# Patient Record
Sex: Female | Born: 1960 | Race: White | Hispanic: No | Marital: Married | Smoking: Never smoker
Health system: Southern US, Community
[De-identification: ages and names within clinical notes are randomized; demographics above are authoritative.]

## PROBLEM LIST (undated history)

## (undated) DIAGNOSIS — Z9889 Other specified postprocedural states: Secondary | ICD-10-CM

## (undated) DIAGNOSIS — I739 Peripheral vascular disease, unspecified: Secondary | ICD-10-CM

## (undated) DIAGNOSIS — C50919 Malignant neoplasm of unspecified site of unspecified female breast: Secondary | ICD-10-CM

## (undated) DIAGNOSIS — C7951 Secondary malignant neoplasm of bone: Secondary | ICD-10-CM

## (undated) DIAGNOSIS — R569 Unspecified convulsions: Secondary | ICD-10-CM

## (undated) DIAGNOSIS — R112 Nausea with vomiting, unspecified: Secondary | ICD-10-CM

## (undated) DIAGNOSIS — Z923 Personal history of irradiation: Secondary | ICD-10-CM

## (undated) HISTORY — DX: Malignant neoplasm of unspecified site of unspecified female breast: C50.919

## (undated) HISTORY — DX: Unspecified convulsions: R56.9

## (undated) HISTORY — PX: OTHER SURGICAL HISTORY: SHX169

---

## 2003-04-01 HISTORY — PX: BREAST LUMPECTOMY: SHX2

## 2004-01-26 ENCOUNTER — Other Ambulatory Visit: Admission: RE | Admit: 2004-01-26 | Discharge: 2004-01-26 | Payer: Self-pay | Admitting: Obstetrics and Gynecology

## 2004-02-06 ENCOUNTER — Encounter: Admission: RE | Admit: 2004-02-06 | Discharge: 2004-02-06 | Payer: Self-pay | Admitting: Obstetrics and Gynecology

## 2004-02-15 ENCOUNTER — Encounter: Admission: RE | Admit: 2004-02-15 | Discharge: 2004-02-15 | Payer: Self-pay | Admitting: *Deleted

## 2004-03-15 ENCOUNTER — Ambulatory Visit (HOSPITAL_BASED_OUTPATIENT_CLINIC_OR_DEPARTMENT_OTHER): Admission: RE | Admit: 2004-03-15 | Discharge: 2004-03-15 | Payer: Self-pay | Admitting: *Deleted

## 2004-03-15 ENCOUNTER — Ambulatory Visit (HOSPITAL_COMMUNITY): Admission: RE | Admit: 2004-03-15 | Discharge: 2004-03-15 | Payer: Self-pay | Admitting: *Deleted

## 2004-03-19 ENCOUNTER — Ambulatory Visit: Payer: Self-pay | Admitting: Oncology

## 2004-03-28 ENCOUNTER — Encounter (INDEPENDENT_AMBULATORY_CARE_PROVIDER_SITE_OTHER): Payer: Self-pay | Admitting: *Deleted

## 2004-03-28 ENCOUNTER — Ambulatory Visit: Admission: RE | Admit: 2004-03-28 | Discharge: 2004-03-28 | Payer: Self-pay | Admitting: Oncology

## 2004-03-30 ENCOUNTER — Emergency Department (HOSPITAL_COMMUNITY): Admission: EM | Admit: 2004-03-30 | Discharge: 2004-03-30 | Payer: Self-pay | Admitting: Emergency Medicine

## 2004-03-31 ENCOUNTER — Emergency Department (HOSPITAL_COMMUNITY): Admission: EM | Admit: 2004-03-31 | Discharge: 2004-03-31 | Payer: Self-pay | Admitting: Emergency Medicine

## 2004-04-25 ENCOUNTER — Ambulatory Visit (HOSPITAL_COMMUNITY): Admission: RE | Admit: 2004-04-25 | Discharge: 2004-04-25 | Payer: Self-pay | Admitting: General Surgery

## 2004-04-25 ENCOUNTER — Ambulatory Visit (HOSPITAL_BASED_OUTPATIENT_CLINIC_OR_DEPARTMENT_OTHER): Admission: RE | Admit: 2004-04-25 | Discharge: 2004-04-25 | Payer: Self-pay | Admitting: General Surgery

## 2004-04-25 ENCOUNTER — Encounter (INDEPENDENT_AMBULATORY_CARE_PROVIDER_SITE_OTHER): Payer: Self-pay | Admitting: *Deleted

## 2004-05-15 ENCOUNTER — Ambulatory Visit: Payer: Self-pay | Admitting: Oncology

## 2004-06-28 ENCOUNTER — Ambulatory Visit: Payer: Self-pay | Admitting: Oncology

## 2004-07-02 ENCOUNTER — Ambulatory Visit: Admission: RE | Admit: 2004-07-02 | Discharge: 2004-08-14 | Payer: Self-pay | Admitting: Radiation Oncology

## 2004-07-31 ENCOUNTER — Ambulatory Visit: Payer: Self-pay | Admitting: Oncology

## 2004-08-05 ENCOUNTER — Ambulatory Visit: Payer: Self-pay | Admitting: Radiation Oncology

## 2004-08-29 ENCOUNTER — Ambulatory Visit: Payer: Self-pay | Admitting: Radiation Oncology

## 2004-09-28 ENCOUNTER — Ambulatory Visit: Payer: Self-pay | Admitting: Radiation Oncology

## 2004-10-07 ENCOUNTER — Ambulatory Visit: Payer: Self-pay | Admitting: Oncology

## 2004-10-29 ENCOUNTER — Ambulatory Visit: Payer: Self-pay | Admitting: Radiation Oncology

## 2004-11-06 ENCOUNTER — Ambulatory Visit (HOSPITAL_BASED_OUTPATIENT_CLINIC_OR_DEPARTMENT_OTHER): Admission: RE | Admit: 2004-11-06 | Discharge: 2004-11-06 | Payer: Self-pay | Admitting: General Surgery

## 2004-12-05 ENCOUNTER — Ambulatory Visit: Payer: Self-pay | Admitting: Oncology

## 2005-02-06 ENCOUNTER — Encounter: Admission: RE | Admit: 2005-02-06 | Discharge: 2005-02-06 | Payer: Self-pay | Admitting: Internal Medicine

## 2005-03-04 ENCOUNTER — Ambulatory Visit: Payer: Self-pay | Admitting: Oncology

## 2005-06-20 ENCOUNTER — Ambulatory Visit: Payer: Self-pay | Admitting: Oncology

## 2005-07-22 ENCOUNTER — Encounter: Admission: RE | Admit: 2005-07-22 | Discharge: 2005-07-22 | Payer: Self-pay | Admitting: Oncology

## 2005-08-02 ENCOUNTER — Encounter: Admission: RE | Admit: 2005-08-02 | Discharge: 2005-08-02 | Payer: Self-pay | Admitting: Oncology

## 2005-08-03 ENCOUNTER — Encounter: Admission: RE | Admit: 2005-08-03 | Discharge: 2005-08-03 | Payer: Self-pay | Admitting: Oncology

## 2005-08-12 ENCOUNTER — Encounter (INDEPENDENT_AMBULATORY_CARE_PROVIDER_SITE_OTHER): Payer: Self-pay | Admitting: *Deleted

## 2005-08-12 ENCOUNTER — Encounter: Admission: RE | Admit: 2005-08-12 | Discharge: 2005-08-12 | Payer: Self-pay | Admitting: Oncology

## 2005-11-11 ENCOUNTER — Ambulatory Visit: Payer: Self-pay | Admitting: Oncology

## 2005-11-13 LAB — CBC WITH DIFFERENTIAL (CANCER CENTER ONLY)
EOS%: 3.2 % (ref 0.0–7.0)
Eosinophils Absolute: 0.2 10*3/uL (ref 0.0–0.5)
LYMPH%: 25.7 % (ref 14.0–48.0)
MCH: 32.3 pg (ref 26.0–34.0)
MCHC: 33.7 g/dL (ref 32.0–36.0)
MCV: 96 fL (ref 81–101)
MONO%: 7.8 % (ref 0.0–13.0)
NEUT#: 4.4 10*3/uL (ref 1.5–6.5)
Platelets: 322 10*3/uL (ref 145–400)
RBC: 4.02 10*6/uL (ref 3.70–5.32)

## 2005-11-14 LAB — COMPREHENSIVE METABOLIC PANEL
ALT: 31 U/L (ref 0–40)
AST: 26 U/L (ref 0–37)
Albumin: 4.1 g/dL (ref 3.5–5.2)
Alkaline Phosphatase: 86 U/L (ref 39–117)
BUN: 11 mg/dL (ref 6–23)
CO2: 25 mEq/L (ref 19–32)
Calcium: 9.5 mg/dL (ref 8.4–10.5)
Chloride: 108 mEq/L (ref 96–112)
Creatinine, Ser: 0.9 mg/dL (ref 0.40–1.20)
Glucose, Bld: 110 mg/dL — ABNORMAL HIGH (ref 70–99)
Potassium: 4 mEq/L (ref 3.5–5.3)
Sodium: 142 mEq/L (ref 135–145)
Total Bilirubin: 0.6 mg/dL (ref 0.3–1.2)
Total Protein: 6.5 g/dL (ref 6.0–8.3)

## 2005-11-14 LAB — LACTATE DEHYDROGENASE: LDH: 141 U/L (ref 94–250)

## 2005-11-14 LAB — CANCER ANTIGEN 27.29: CA 27.29: 15 U/mL (ref 0–39)

## 2006-02-06 ENCOUNTER — Encounter: Admission: RE | Admit: 2006-02-06 | Discharge: 2006-02-06 | Payer: Self-pay | Admitting: Oncology

## 2006-04-17 ENCOUNTER — Emergency Department: Payer: Self-pay | Admitting: Internal Medicine

## 2006-05-25 ENCOUNTER — Ambulatory Visit: Payer: Self-pay | Admitting: Oncology

## 2006-05-26 LAB — COMPREHENSIVE METABOLIC PANEL
ALT: 28 U/L (ref 0–35)
AST: 21 U/L (ref 0–37)
Albumin: 4.2 g/dL (ref 3.5–5.2)
Alkaline Phosphatase: 93 U/L (ref 39–117)
BUN: 12 mg/dL (ref 6–23)
CO2: 27 mEq/L (ref 19–32)
Calcium: 9.6 mg/dL (ref 8.4–10.5)
Chloride: 100 mEq/L (ref 96–112)
Creatinine, Ser: 0.82 mg/dL (ref 0.40–1.20)
Glucose, Bld: 96 mg/dL (ref 70–99)
Potassium: 3.7 mEq/L (ref 3.5–5.3)
Sodium: 137 mEq/L (ref 135–145)
Total Bilirubin: 0.5 mg/dL (ref 0.3–1.2)
Total Protein: 6.9 g/dL (ref 6.0–8.3)

## 2006-05-26 LAB — CBC WITH DIFFERENTIAL (CANCER CENTER ONLY)
BASO%: 0.5 % (ref 0.0–2.0)
LYMPH%: 29.2 % (ref 14.0–48.0)
MCH: 33.5 pg (ref 26.0–34.0)
MCV: 97 fL (ref 81–101)
MONO#: 0.4 10*3/uL (ref 0.1–0.9)
MONO%: 6.4 % (ref 0.0–13.0)
NEUT#: 4.1 10*3/uL (ref 1.5–6.5)
Platelets: 347 10*3/uL (ref 145–400)
RDW: 12 % (ref 10.5–14.6)
WBC: 6.6 10*3/uL (ref 3.9–10.0)

## 2006-05-26 LAB — CANCER ANTIGEN 27.29: CA 27.29: 26 U/mL (ref 0–39)

## 2006-05-26 LAB — LACTATE DEHYDROGENASE: LDH: 144 U/L (ref 94–250)

## 2006-07-02 ENCOUNTER — Emergency Department: Payer: Self-pay | Admitting: Emergency Medicine

## 2006-07-27 ENCOUNTER — Encounter: Admission: RE | Admit: 2006-07-27 | Discharge: 2006-07-27 | Payer: Self-pay | Admitting: Oncology

## 2006-11-12 ENCOUNTER — Ambulatory Visit: Payer: Self-pay | Admitting: Oncology

## 2006-11-13 LAB — CBC WITH DIFFERENTIAL (CANCER CENTER ONLY)
BASO%: 0.6 % (ref 0.0–2.0)
LYMPH#: 1.5 10*3/uL (ref 0.9–3.3)
MONO#: 0.3 10*3/uL (ref 0.1–0.9)
Platelets: 314 10*3/uL (ref 145–400)
RBC: 4.38 10*6/uL (ref 3.70–5.32)
RDW: 11.9 % (ref 10.5–14.6)
WBC: 4.7 10*3/uL (ref 3.9–10.0)

## 2006-11-13 LAB — COMPREHENSIVE METABOLIC PANEL
ALT: 62 U/L — ABNORMAL HIGH (ref 0–35)
AST: 27 U/L (ref 0–37)
Albumin: 4.6 g/dL (ref 3.5–5.2)
Alkaline Phosphatase: 90 U/L (ref 39–117)
BUN: 10 mg/dL (ref 6–23)
CO2: 22 mEq/L (ref 19–32)
Calcium: 9.4 mg/dL (ref 8.4–10.5)
Chloride: 104 mEq/L (ref 96–112)
Creatinine, Ser: 0.74 mg/dL (ref 0.40–1.20)
Glucose, Bld: 90 mg/dL (ref 70–99)
Potassium: 4.1 mEq/L (ref 3.5–5.3)
Sodium: 139 mEq/L (ref 135–145)
Total Bilirubin: 0.6 mg/dL (ref 0.3–1.2)
Total Protein: 7 g/dL (ref 6.0–8.3)

## 2006-11-13 LAB — CANCER ANTIGEN 27.29: CA 27.29: 19 U/mL (ref 0–39)

## 2007-07-01 ENCOUNTER — Ambulatory Visit: Payer: Self-pay | Admitting: Oncology

## 2007-09-01 ENCOUNTER — Encounter: Admission: RE | Admit: 2007-09-01 | Discharge: 2007-09-01 | Payer: Self-pay | Admitting: Oncology

## 2007-09-10 ENCOUNTER — Ambulatory Visit: Payer: Self-pay | Admitting: Oncology

## 2007-10-28 ENCOUNTER — Ambulatory Visit: Payer: Self-pay | Admitting: Oncology

## 2007-11-02 LAB — CBC WITH DIFFERENTIAL (CANCER CENTER ONLY)
BASO#: 0 10*3/uL (ref 0.0–0.2)
BASO%: 0.5 % (ref 0.0–2.0)
EOS%: 2.6 % (ref 0.0–7.0)
Eosinophils Absolute: 0.2 10*3/uL (ref 0.0–0.5)
HCT: 39.9 % (ref 34.8–46.6)
HGB: 13.8 g/dL (ref 11.6–15.9)
LYMPH#: 1.9 10*3/uL (ref 0.9–3.3)
LYMPH%: 31 % (ref 14.0–48.0)
MCH: 31.8 pg (ref 26.0–34.0)
MCHC: 34.5 g/dL (ref 32.0–36.0)
MCV: 92 fL (ref 81–101)
MONO#: 0.4 10*3/uL (ref 0.1–0.9)
MONO%: 6.4 % (ref 0.0–13.0)
NEUT#: 3.6 10*3/uL (ref 1.5–6.5)
NEUT%: 59.5 % (ref 39.6–80.0)
Platelets: 323 10*3/uL (ref 145–400)
RBC: 4.33 10*6/uL (ref 3.70–5.32)
RDW: 11.8 % (ref 10.5–14.6)
WBC: 6.1 10*3/uL (ref 3.9–10.0)

## 2007-11-02 LAB — COMPREHENSIVE METABOLIC PANEL
ALT: 21 U/L (ref 0–35)
AST: 15 U/L (ref 0–37)
Albumin: 4.7 g/dL (ref 3.5–5.2)
Alkaline Phosphatase: 69 U/L (ref 39–117)
BUN: 15 mg/dL (ref 6–23)
CO2: 25 mEq/L (ref 19–32)
Calcium: 10.2 mg/dL (ref 8.4–10.5)
Chloride: 103 mEq/L (ref 96–112)
Creatinine, Ser: 0.84 mg/dL (ref 0.40–1.20)
Glucose, Bld: 79 mg/dL (ref 70–99)
Potassium: 4 mEq/L (ref 3.5–5.3)
Sodium: 141 mEq/L (ref 135–145)
Total Bilirubin: 0.5 mg/dL (ref 0.3–1.2)
Total Protein: 7.6 g/dL (ref 6.0–8.3)

## 2007-11-02 LAB — CANCER ANTIGEN 27.29: CA 27.29: 25 U/mL (ref 0–39)

## 2008-03-31 DIAGNOSIS — R569 Unspecified convulsions: Secondary | ICD-10-CM

## 2008-03-31 HISTORY — DX: Unspecified convulsions: R56.9

## 2010-02-28 DIAGNOSIS — I739 Peripheral vascular disease, unspecified: Secondary | ICD-10-CM

## 2010-02-28 HISTORY — PX: PORTACATH PLACEMENT: SHX2246

## 2010-02-28 HISTORY — PX: AXILLARY LYMPH NODE DISSECTION: SHX5229

## 2010-02-28 HISTORY — DX: Peripheral vascular disease, unspecified: I73.9

## 2010-04-17 LAB — PROTIME-INR

## 2010-04-21 ENCOUNTER — Encounter: Payer: Self-pay | Admitting: Oncology

## 2010-08-16 NOTE — Op Note (Signed)
NAME:  [name] [name], [name] [name]           ACCOUNT NO.:  [id number]   MEDICAL RECORD NO.:  [id number]          PATIENT TYPE:  AMB   LOCATION:  DSC                          FACILITY:  [facility]   PHYSICIAN:  Anselm Pancoast. Weatherly, M.D.DATE OF BIRTH:  1960-10-29   DATE OF PROCEDURE:  11/06/2004  DATE OF DISCHARGE:                                 OPERATIVE REPORT   PREOPERATIVE DIAGNOSIS:  Port-A-Cath non-use, status post treatment for  carcinoma of the right breast.   OPERATION/PROCEDURE:  Removal of the Port-A-Cath.   ANESTHESIA:  Local with sedation.   SURGEON:  Anselm Pancoast. Zachery Dakins, M.D.   HISTORY:  [name] [name] is a 50 year old female who had a lumpectomy  approximately six months ago.  Had chemotherapy and radiation afterwards.  She has completed all this and now no longer needs a Port-A-Cath.  She is  doing nicely and desires local with sedation instead of doing it with  straight local only.   DESCRIPTION OF PROCEDURE:  She was taken to the operative suite.  An IV had  been started on the left hand.  The Port-A-Cath is in the left subclavian  area.  Prep with Betadine surgical solution and draped in the sterile  manner.  The Port-A-Cath which was low profile, incision site was  infiltrated with Xylocaine with adrenalin and a small incision was made.  Short dissection down to the Port-A-Cath.  The two sutures anchoring it were  divided so that the Port-A-Cath could be elevated.  I then placed a 3-0  chromic around the Silastic tube and removed the Port-A-Cath tying the  little 3-0 chromic.  The pocket then was closed with 3-0 chromic interrupted  sutures, 0 Monocryl subcuticular and then a couple of half-inch Benzoin and  Steri-Strips on the skin.  The patient tolerated the procedure nicely and  was sent to the recovery room in stable postoperative condition.   Hopefully, she will have no further problems with her breast cancer. Will be  seen in followup in approximately two  weeks.       WJW/MEDQ  D:  11/06/2004  T:  11/06/2004  Job:  [id number]

## 2011-03-05 ENCOUNTER — Ambulatory Visit: Payer: 59 | Attending: Hematology & Oncology | Admitting: Physical Therapy

## 2011-03-05 DIAGNOSIS — M25519 Pain in unspecified shoulder: Secondary | ICD-10-CM | POA: Insufficient documentation

## 2011-03-05 DIAGNOSIS — M24519 Contracture, unspecified shoulder: Secondary | ICD-10-CM | POA: Insufficient documentation

## 2011-03-05 DIAGNOSIS — IMO0001 Reserved for inherently not codable concepts without codable children: Secondary | ICD-10-CM | POA: Insufficient documentation

## 2011-03-05 DIAGNOSIS — I890 Lymphedema, not elsewhere classified: Secondary | ICD-10-CM | POA: Insufficient documentation

## 2011-03-07 ENCOUNTER — Ambulatory Visit: Payer: 59 | Admitting: Physical Therapy

## 2011-03-28 ENCOUNTER — Encounter: Payer: 59 | Admitting: Physical Therapy

## 2011-04-02 ENCOUNTER — Ambulatory Visit: Payer: 59 | Attending: Hematology & Oncology | Admitting: Physical Therapy

## 2011-04-02 DIAGNOSIS — I890 Lymphedema, not elsewhere classified: Secondary | ICD-10-CM | POA: Insufficient documentation

## 2011-04-02 DIAGNOSIS — IMO0001 Reserved for inherently not codable concepts without codable children: Secondary | ICD-10-CM | POA: Insufficient documentation

## 2011-04-02 DIAGNOSIS — M25519 Pain in unspecified shoulder: Secondary | ICD-10-CM | POA: Insufficient documentation

## 2011-04-02 DIAGNOSIS — M24519 Contracture, unspecified shoulder: Secondary | ICD-10-CM | POA: Insufficient documentation

## 2011-04-04 ENCOUNTER — Encounter: Payer: 59 | Admitting: Physical Therapy

## 2011-04-07 ENCOUNTER — Encounter: Payer: 59 | Admitting: Physical Therapy

## 2011-04-09 ENCOUNTER — Encounter: Payer: 59 | Admitting: Physical Therapy

## 2011-04-14 ENCOUNTER — Telehealth: Payer: Self-pay | Admitting: Oncology

## 2011-04-14 ENCOUNTER — Ambulatory Visit (HOSPITAL_BASED_OUTPATIENT_CLINIC_OR_DEPARTMENT_OTHER): Payer: 59 | Admitting: Oncology

## 2011-04-14 ENCOUNTER — Ambulatory Visit: Payer: 59

## 2011-04-14 ENCOUNTER — Encounter: Payer: 59 | Admitting: Physical Therapy

## 2011-04-14 ENCOUNTER — Other Ambulatory Visit: Payer: 59 | Admitting: Lab

## 2011-04-14 VITALS — BP 129/85 | HR 109 | Temp 98.4°F | Ht 63.0 in | Wt 174.9 lb

## 2011-04-14 DIAGNOSIS — R232 Flushing: Secondary | ICD-10-CM

## 2011-04-14 DIAGNOSIS — F411 Generalized anxiety disorder: Secondary | ICD-10-CM

## 2011-04-14 DIAGNOSIS — C50919 Malignant neoplasm of unspecified site of unspecified female breast: Secondary | ICD-10-CM

## 2011-04-14 DIAGNOSIS — F419 Anxiety disorder, unspecified: Secondary | ICD-10-CM

## 2011-04-14 MED ORDER — BUPROPION HCL ER (XL) 150 MG PO TB24: 150.0000 mg | ORAL_TABLET | Freq: Every day | ORAL | Status: DC

## 2011-04-14 NOTE — Progress Notes (Signed)
[name] [name] [id number] 16-May-1960 51 y.o. 04/14/2011 5:41 PM  CC  No primary provider on file. No primary provider on file.  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 She subsequently had a local regional recurrence to the right axilla for which she was treated at [facility]. She is now reestablishing her care to [facility]  REFERRING PHYSICIAN: Dr. Charlesetta Garibaldi  HISTORY OF PRESENT ILLNESS:  [name] [name] is a 51 y.o. female.  With oncologic history dating back to 2005 when she was diagnosed with right-sided breast carcinoma in 02/06/2004. At that time she underwent a right lumpectomy with sentinel lymph node biopsy that revealed a 1.5 cm invasive ductal carcinoma. 01 positive lymph nodes in 03/15/2004. She post lumpectomy received 4 cycles of AC chemotherapy from 05/08/2004 through April 2006. She then went on to receive radiation [facility] the [facility] June 2006 09/29/2004. Patient was offered adjuvant endocrine therapy but she declined. She was thereafter observed at [facility] by me. I until her husband relocated to [place]. At which time she transferred her care to [facility]. She continued to do well until July 2008 11 when she presented with a right axillary mass. An ultrasound revealed a 2.5 cm lesion and a subsequent ultrasound guided core biopsy revealed a high-grade invasive ductal carcinoma that was ER positive PR borderline positive and HER-2/neu negative. Patient received neoadjuvant chemotherapy consisting of Taxol and Cytoxan for a total of 3 cycles. Her chemotherapy was stopped prematurely secondary to intolerance of Taxol. December 2011 patient was taken to the OR and she underwent an axillary lymph node dissection that revealed 3 of 8 lymph nodes positive for metastatic disease. Postoperatively she read  completed a course of postop radiation therapy to the axilla and supraclavicular regional nodes about in March completing it in March 2012. In February 2012 patient complained of new onset dizziness associated with visual field cuts. She also had an isolated seizure in April 2011. MRI of the brain was ordered that was negative first rubra metastases. Patient was offered adjuvant endocrine therapy by Dr. Kandice Hams in March 2012. The patient did spruce start letrozole 2.5 mg but she could not tolerate it she subsequently received exemestane but she discontinued that as well do to toxicity in July 2012. She was then transitioned to tamoxifen 20 mg September 2012. She was last seen by her oncologist at hematology oncology clinic [facility] on 12/23/2010. She has now relocated to [facility] and she is reestablishing her care here at the [facility].   Past Medical History:  #1 breast cancer as above  #2 isolated seizure April 2010 negative for workup with no recurrent episodes.  #3 elevated liver enzymes.  #4 DVT of the right IJ  #5 status post radiotherapy per history of present illness.  #6 no history of autoimmune illnesses collagen vascular disorders cardiac pacemakers or implanted defibrillator's.  Past Surgical History: #1 status post lumpectomy with sentinel node biopsy 2005.  #2 status post axillary lymph node dissection of the right axilla in December 2011.   No past surgical history on file.  Family History:there is no family history of breast cancers or other malignancies.  Social History:the patient does not smoke does not drink she is currently trying to build her home.  Allergies: Allergies  Allergen Reactions  . Tegaderm Ag Mesh (Silver)  Current Medications: Current Outpatient Prescriptions  Medication Sig Dispense Refill  . Calcium-Vitamin D (CALTRATE 600 PLUS-VIT D PO) Take 600 mg by mouth 2 (two) times daily.      .  folic acid (FOLVITE) 1 MG tablet Take 1 mg by mouth daily.      . Melatonin 1 MG TABS Take 1 mg by mouth at bedtime as needed.      . tamoxifen (NOLVADEX) 20 MG tablet Take 20 mg by mouth daily.      Marland Kitchen buPROPion (WELLBUTRIN XL) 150 MG 24 hr tablet Take 1 tablet (150 mg total) by mouth daily.  30 tablet  6    OB/GYN History:patient is postmenopausal she has not been on hormone replacement therapy. She has had one pregnancy  Fertility Discussion:not applicable Prior History of Cancer:as in the history of present illness   ECOG PERFORMANCE STATUS: 1 - Symptomatic but completely ambulatory  Genetic Counseling/testing:patient did undergo genetic counseling and testing and she was found to be BRCA1 and 2 negative.  REVIEW OF SYSTEMS:  Constitutional: positive for fatigue and night sweats Eyes: negative Ears, nose, mouth, throat, and face: negative Respiratory: negative Cardiovascular: negative Gastrointestinal: positive for constipation Genitourinary:negative Integument/breast: positive for breast tenderness and patient does have a well-healed surgical scar in the right breast she also has a healed surgical scar in the right axilla. She is notice to have some swelling in the axillary region. Hematologic/lymphatic: negative Musculoskeletal:negative Neurological: negative Endocrine: positive for Patient is quite concurrent concerned about weight gain. She is interested in possibly getting a pill to help her lose weight. She does try to exercise and be healthy. But in spite of that she continues to gain weight  PHYSICAL EXAMINATION: Blood pressure 129/85, pulse 109, temperature 98.4 F (36.9 C), temperature source Oral, height 5\' 3"  (1.6 m), weight 174 lb 14.4 oz (79.334 kg).  ZOX:WRUEA, healthy, no distress, well nourished, well developed and anxious SKIN: skin color, texture, turgor are normal HEAD: Normocephalic, No masses, lesions, tenderness or abnormalities EYES: normal, PERRLA,  EOMI, Conjunctiva are pink and non-injected, sclera clear EARS: External ears normal OROPHARYNX:no exudate, no erythema, lips, buccal mucosa, and tongue normal and dentition normal  NECK: supple, no adenopathy, no bruits, no JVD, thyroid normal size, non-tender, without nodularity LYMPH:  no palpable lymphadenopathy, no hepatosplenomegaly BREAST:left breast normal without mass, skin or nipple changes or axillary nodes, surgical scars noted the right breast surgical scar is noted there is also an axillary dissection scar noted there is some swelling in the axilla. LUNGS: clear to auscultation , clear to auscultation and percussion HEART: regular rate & rhythm, no murmurs and no gallops ABDOMEN:abdomen soft, non-tender, normal bowel sounds and no masses or organomegaly BACK: Back symmetric, no curvature., No CVA tenderness, Range of motion is normal EXTREMITIES:no joint deformities, effusion, or inflammation, no edema, no clubbing, no cyanosis  NEURO: alert & oriented x 3 with fluent speech, no focal motor/sensory deficits, gait normal, reflexes normal and symmetric    STUDIES/RESULTS: No results found.   LABS:    Chemistry      Component Value Date/Time   NA 141 11/02/2007 1116   K 4.0 11/02/2007 1116   CL 103 11/02/2007 1116   CO2 25 11/02/2007 1116   BUN 15 11/02/2007 1116   CREATININE 0.84 11/02/2007 1116      Component Value Date/Time   CALCIUM 10.2 11/02/2007 1116   ALKPHOS 69 11/02/2007 1116   AST 15 11/02/2007 1116   ALT 21 11/02/2007 1116  BILITOT 0.5 11/02/2007 1116      Lab Results  Component Value Date   WBC 6.1 11/02/2007   HGB 13.8 11/02/2007   HCT 39.9 11/02/2007   MCV 92 11/02/2007   PLT 323 11/02/2007    ASSESSMENT    51 year old female with previous history of 1.7 cm ER positive breast cancer originally diagnosed in 2005. At that time patient underwent a lumpectomy with sentinel node biopsy followed by adjuvant chemotherapy consisting of 4 cycles of a.c. Thereafter she was offered  endocrine therapy but she declined. She however did undergo radiation therapy. In 2011 patient had a local regional axillary recurrence that was diagnosed at [facility]. The core needle biopsy revealed a high-grade invasive ductal carcinoma that was ER positive HER-2/neu negative. Patient underwent neoadjuvant chemotherapy consisting of 3 cycles of Taxol and Cytoxan but discontinued about due to toxicity and intolerance to Taxol. She then went on to have right axillary lymph node dissection the final pathology revealed 3 of 8 lymph nodes positive for invasive cancer. She received local regional radiation therapy to the excellent as well as the supraclavicular region. Thereafter she was recommended antiestrogen therapy with aromatase inhibitors. She was started on letrozole but could not tolerate it therefore after she was transitioned to Aromasin she could not tolerate that well either. And in September 2012 she was transitioned to tamoxifen which she has been using. She continues to complain of significant fatigue she is constipated from the tamoxifen and she is tired. She also complains of having peripheral neuropathies. She also has noticed anxiety disturbances. She also has noticed some lymphedema in the right upper axillary region. She is working with Lupita Leash in the outpatient rehabilitation setting for possible physical therapy and lymphedema therapy.    PLAN:    Patient will continue the tamoxifen 20 mg on a daily basis. She is encouraged to continue seeing the outpatient physical therapy rehabilitation for ongoing therapy with lymphedema. We discussed exercise diet. For peripheral neuropathy we did discuss B12 and possibly of doing folic acid and we also discussed possibility of gabapentin but at this time patient is not interested in doing that. For anxiety hot flashes we did discuss Wellbutrin she was given a prescription for 150 mg daily. She will continue this. We also discussed bone health and  she was recommended that she begin vitamin D3 and we will continue to follow her bone densities. I spent considerable amount of time counseling the patient regarding her disease process. At this time I do not recommend any routine staging studies we would do of radiographic studies if there is symptoms. We will however monitor her liver function studies and she has had liver abnormalities in the past.    Patient will be seen back in one month's time in the survivor clinic and I will continue to see her every 63-6 months time or    Thank you so much for allowing me to participate in the care of [facility] I will continue to follow up the patient with you and assist in her care.  All questions were answered. The patient knows to call the clinic with any problems, questions or concerns. We can certainly see the patient much sooner if necessary.  I spent 55 minutes counseling the patient face to face. The total time spent in the appointment was 60 minutes.  Drue Second, MD Medical/Oncology [facility] [phone] (beeper) [phone] (Office)  04/14/2011, 5:41 PM 04/14/2011, 5:41 PM

## 2011-04-14 NOTE — Telephone Encounter (Signed)
S/w the pt and she is aware of her feb 2013 appt calendar

## 2011-04-16 ENCOUNTER — Encounter: Payer: 59 | Admitting: Physical Therapy

## 2011-04-21 ENCOUNTER — Encounter: Payer: 59 | Admitting: Physical Therapy

## 2011-04-22 ENCOUNTER — Telehealth: Payer: Self-pay | Admitting: *Deleted

## 2011-04-22 ENCOUNTER — Encounter: Payer: Self-pay | Admitting: *Deleted

## 2011-04-22 NOTE — Telephone Encounter (Signed)
Pt called had reaction to Wellbutrin on Saturday & stopped medication.. Will review with MD if other medication is needed instead

## 2011-04-23 ENCOUNTER — Ambulatory Visit: Payer: 59 | Admitting: Physical Therapy

## 2011-04-23 ENCOUNTER — Encounter: Payer: 59 | Admitting: Physical Therapy

## 2011-04-25 ENCOUNTER — Ambulatory Visit: Payer: 59 | Admitting: Physical Therapy

## 2011-04-25 NOTE — Telephone Encounter (Signed)
Per MDAsked pt if she Pt asked

## 2011-04-28 ENCOUNTER — Encounter: Payer: 59 | Admitting: Physical Therapy

## 2011-04-28 ENCOUNTER — Ambulatory Visit: Payer: 59 | Admitting: Physical Therapy

## 2011-04-28 MED ORDER — GABAPENTIN 100 MG PO CAPS: 100.0000 mg | ORAL_CAPSULE | Freq: Every day | ORAL | Status: DC

## 2011-04-28 NOTE — Telephone Encounter (Signed)
Per MD pt to start Neurontin 100mg  Daily x 1week , if tolerates pt to increase to Neurontin 100mg  BID. Called pt to advise. Confirmed pt's Pharmacy Rx to be called into [facility]. Pt advised she will start on Saturday As her husband will be back in town and she would like to wait until that time to start medication in case there are any side effects.

## 2011-04-28 NOTE — Progress Notes (Signed)
Addended by: Cooper Render on: 04/28/2011 05:10 PM   Modules accepted: Orders

## 2011-04-30 ENCOUNTER — Ambulatory Visit: Payer: 59 | Admitting: Physical Therapy

## 2011-04-30 ENCOUNTER — Encounter: Payer: 59 | Admitting: Physical Therapy

## 2011-05-02 ENCOUNTER — Ambulatory Visit: Payer: 59 | Attending: Hematology & Oncology | Admitting: Physical Therapy

## 2011-05-02 DIAGNOSIS — IMO0001 Reserved for inherently not codable concepts without codable children: Secondary | ICD-10-CM | POA: Insufficient documentation

## 2011-05-02 DIAGNOSIS — I890 Lymphedema, not elsewhere classified: Secondary | ICD-10-CM | POA: Insufficient documentation

## 2011-05-02 DIAGNOSIS — M24519 Contracture, unspecified shoulder: Secondary | ICD-10-CM | POA: Insufficient documentation

## 2011-05-02 DIAGNOSIS — M25519 Pain in unspecified shoulder: Secondary | ICD-10-CM | POA: Insufficient documentation

## 2011-05-05 ENCOUNTER — Ambulatory Visit: Payer: 59 | Admitting: Physical Therapy

## 2011-05-07 ENCOUNTER — Ambulatory Visit: Payer: 59 | Admitting: Physical Therapy

## 2011-05-09 ENCOUNTER — Ambulatory Visit: Payer: 59 | Admitting: Physical Therapy

## 2011-05-12 ENCOUNTER — Ambulatory Visit: Payer: 59 | Admitting: Physical Therapy

## 2011-05-14 ENCOUNTER — Ambulatory Visit: Payer: 59 | Admitting: Physical Therapy

## 2011-05-15 ENCOUNTER — Telehealth: Payer: Self-pay | Admitting: *Deleted

## 2011-05-15 NOTE — Telephone Encounter (Signed)
Please tell patient to discontinue the neurontin

## 2011-05-15 NOTE — Telephone Encounter (Signed)
Returned pt's call, discussed concerns. Pt advised she takes Neurotin 100mg  BID  "It makes me sick in am, but not at night and it is not helping and I'm not interested in going up on the dose" Reviewed with MD, Per Dr. Welton Flakes pt is to take Neurotin 200mg  at night.  Notified pt per MD.to take Neurotin 200mg  at night.  Pt states " No, I want to just stay at 100mg  at night and I will think about increasing the dose later on. It's not painful just like my feet are asleep, tingling" Discussed with pt by taking the 200mg  at night per MD this may help with the symptoms she is describing. Pt again states , "no i will stay at 100 at night. I want to try this cream a friend of mine uses that she says helps." Pt unable to recall name of medication, pt verbalized she will speak to [place] on 2/22 at next appt. Pt denied needing further assistance.  Notified MD pt has declined recommendations to take Neurotin 200mg  at night and will continue 100mg  at night.

## 2011-05-16 ENCOUNTER — Ambulatory Visit: Payer: 59 | Admitting: Physical Therapy

## 2011-05-19 ENCOUNTER — Ambulatory Visit: Payer: 59 | Admitting: Physical Therapy

## 2011-05-21 ENCOUNTER — Ambulatory Visit: Payer: 59 | Admitting: Physical Therapy

## 2011-05-22 ENCOUNTER — Other Ambulatory Visit: Payer: 59 | Admitting: Lab

## 2011-05-22 ENCOUNTER — Telehealth: Payer: Self-pay | Admitting: *Deleted

## 2011-05-22 ENCOUNTER — Ambulatory Visit (HOSPITAL_BASED_OUTPATIENT_CLINIC_OR_DEPARTMENT_OTHER): Payer: 59 | Admitting: Family

## 2011-05-22 VITALS — BP 127/87 | HR 101 | Temp 97.8°F | Ht 63.0 in | Wt 178.0 lb

## 2011-05-22 DIAGNOSIS — C50919 Malignant neoplasm of unspecified site of unspecified female breast: Secondary | ICD-10-CM

## 2011-05-22 LAB — CBC WITH DIFFERENTIAL/PLATELET
BASO%: 0.3 % (ref 0.0–2.0)
EOS%: 1.2 % (ref 0.0–7.0)
HCT: 40.7 % (ref 34.8–46.6)
LYMPH%: 23.9 % (ref 14.0–49.7)
MCH: 32.8 pg (ref 25.1–34.0)
MCHC: 34.6 g/dL (ref 31.5–36.0)
NEUT%: 71.2 % (ref 38.4–76.8)
RBC: 4.28 10*6/uL (ref 3.70–5.45)
WBC: 6.1 10*3/uL (ref 3.9–10.3)
lymph#: 1.4 10*3/uL (ref 0.9–3.3)

## 2011-05-22 LAB — COMPREHENSIVE METABOLIC PANEL
ALT: 29 U/L (ref 0–35)
AST: 25 U/L (ref 0–37)
Chloride: 101 mEq/L (ref 96–112)
Creatinine, Ser: 0.93 mg/dL (ref 0.50–1.10)
Sodium: 139 mEq/L (ref 135–145)
Total Bilirubin: 0.3 mg/dL (ref 0.3–1.2)
Total Protein: 6.8 g/dL (ref 6.0–8.3)

## 2011-05-22 NOTE — Telephone Encounter (Signed)
gave patient appointment for 07-2011 per orders no labs needed

## 2011-05-23 ENCOUNTER — Encounter: Payer: Self-pay | Admitting: Family

## 2011-05-23 ENCOUNTER — Ambulatory Visit: Payer: 59 | Admitting: Physical Therapy

## 2011-05-23 DIAGNOSIS — C50919 Malignant neoplasm of unspecified site of unspecified female breast: Secondary | ICD-10-CM | POA: Insufficient documentation

## 2011-05-23 NOTE — Progress Notes (Signed)
[facility] SURVIVOR CLINIC EVALUATION  Name: [name] [name]                  DATE: 05/23/2011 MRN: [id number]                      DOB: [birth year]           REASON FOR VISIT: Establish care in the Breast Cancer Survivor Clinic.   DIAGNOSIS:  Encounter Diagnosis  Name Primary?  . Breast cancer     CANCER STAGE:  2005,  T1 N1 M0 Stage IIA right breast cancer. Axillary recurrence July 2011.   HISTORY OF PRESENT ILLNESS: 02/06/2004 was diagnosed with right breast carcinoma. 03/15/2004 had right lumpectomy with sentinel lymph node biopsy that revealed a 1.5 cm invasive ductal carcinoma with 1 positive lymph nodes. Received 4 cycles of AC chemotherapy from 05/08/2004 through April 2006. Received radiation therapy at [facility] regional June 2006 09/29/2004. Was offered adjuvant endocrine therapy, declined. Was observed at [facility] by Dr. Welton Flakes until her husband relocated to [place], [place] and she transferred her care to [facility]. July 2011, presented with a right axillary mass, ultrasound revealed a 2.5 cm lesion. Core biopsy revealed a high-grade invasive ductal carcinoma, ER/PR positive, HER-2/neu negative. Received neoadjuvant chemotherapy with Taxol and Cytoxan, 3 cycles. Chemotherapy was stopped prematurely secondary to intolerance of Taxol. December 2011 had axillary lymph node dissection that revealed 3 of 8 lymph nodes positive for metastatic disease. Postoperatively, had radiation therapy to the axilla and supraclavicular regional nodes, completing March 2012. February 2012, complained of new onset dizziness associated with visual field cuts, had an isolated seizure in April 2011. MRI of the brain negative for metastases. Was offered adjuvant endocrine therapy by Dr. Kandice Hams, March 2012. Started letrozole 2.5 mg, unable to tolerate and subsequently received exemestane. Discontinued exemestane due to toxicity July 2012. Started  tamoxifen 20 mg September 2012. Last seen by oncology clinic [facility] 12/23/2010. Has now transferred care to [facility].  Dr. Welton Flakes had prescribed gabapentin on recent visit for hot flashes. She wishes to discontinue due to side effects.  Has ongoing problems with lymphedema, right breast and right arm. Is receiving occupational therapy with good results.    PAST MEDICAL HISTORY:  Past Medical History  Diagnosis Date  . Breast cancer   . Seizures 2010    Isolated incident.     ALLERGIES:  Allergies as of 05/22/2011 - Review Complete 05/22/2011  Allergen Reaction Noted  . Tegaderm ag mesh (silver)  04/14/2011     MEDICATIONS:  Current Outpatient Prescriptions  Medication Sig Dispense Refill  . Calcium-Magnesium-Vitamin D (CITRACAL CALCIUM+D PO) Take by mouth. 400mg  vitamin C, 500mg  vitamin D3 2 PO DAILY      . folic acid (FOLVITE) 1 MG tablet Take 1 mg by mouth daily.      Marland Kitchen gabapentin (NEURONTIN) 100 MG capsule Take 1 capsule (100 mg total) by mouth daily. Pt to start 05/03/11 neurontin 100mg  daily x 1 week then if pt tolerates to increase  Neurontin 100 mg BID.  60 capsule  1  . Melatonin 1 MG TABS Take 1 mg by mouth at bedtime as needed.      . tamoxifen (NOLVADEX) 20 MG tablet Take 20 mg by mouth daily.         SOCIAL HISTORY:  History   Social History: Married   Occupational History: Housewife  Social History Main Topics  . Smoking status: Never Smoker   . Smokeless tobacco: Never Used  . Alcohol Use: No  . Drug Use: No  . Sexually Active: Yes   FAMILY HISTORY: No family history of breast cancer.  SELF-ASSESSMENT CONCERNS: Physical: More fatigued than usual, tingling/numbness in the hands or feet, memory problems, hot flashes, 4- 5 a day. Spiritual/Religious: None. Practical: None. Family/Relationship: None Emotional: Mood swings. Lifestyle or Information Needs: None.   REVIEW OF SYSTEMS: General: Negative for fever,  chills, night sweats,  loss of appetite or weight loss.Hot flashes 4-5 daily.  HEENT: Negative for headaches, sore  throat, difficulty swallowing, blurred vision or problem with hearing or  sinus congestion. Respiratory: Negative for shortness of breath, cough  or dyspnea on exertion. Cardiovascular: Negative for chest pain,  palpitations or pedal edema. GI: Negative for nausea, vomiting,  diarrhea, constipation, change in bowel habits or blood in the stool.  No jaundice. GU: Negative for painful or frequent urination, change in  color of urine, or decreased urinary stream. Integumentary: Negative  for skin rashes or other suspicious skin lesions. Hematologic: Negative  for easy bruisability or bleeding. Musculoskeletal: Negative for  complaints of pain, arthralgias, arthritis or myalgias.  Lymph: Lymphedema, right arm and right axillary tail of the breast. Neurological/psychiatric: Negative for numbness, focal weakness,  balance problems or coordination difficulties. No depression or anxiety. Breast: No self-detected breast complaints.    PHYSICAL EXAM: BP 127/87  Pulse 101  Temp(Src) 97.8 F (36.6 C) (Oral)  Ht 5\' 3"  (1.6 m)  Wt 178 lb (80.74 kg)  BMI 31.53 kg/m2 GENERAL: Well developed, well nourished, in no acute distress.  EENT: No ocular or oral lesions. No stomatitis.  RESPIRATORY: Lungs are clear to auscultation bilaterally with normal respiratory movement and no accessory muscle use. CARDIAC: No murmur, rub or tachycardia. No upper or lower extremity edema.  GI: Abdomen is soft, no palpable hepatosplenomegaly. No fluid wave. No tenderness. Musculoskeletal: No kyphosis, no tenderness over the spine, ribs or hips. Lymph: No cervical, infraclavicular, or inguinal adenopathy. Right upper extremity, mild lymphedema. Breast: In the supine position, with the right arm over the head, the right nipple is everted. No periareolar edema or nipple discharge. No mass in any quadrant or  subareolar region. No redness of the skin. No right axillary adenopathy.10 o'clock position, a remote lumpectomy incision with mirror right axillary incision, 6 cm apart.  With the left arm over the head, the left nipple is everted. No periareolar edema or nipple discharge. No mass in any quadrant or subareolar region. No redness of the skin. No left axillary adenopathy. Neuro: No focal neurological deficits. Psych: Alert and oriented X 3, appropriate mood and affect.     LABORATORY STUDIES:  Results for orders placed in visit on 05/22/11  CBC WITH DIFFERENTIAL      Component Value Range   WBC 6.1  3.9 - 10.3 (10e3/uL)   NEUT# 4.3  1.5 - 6.5 (10e3/uL)   HGB 14.1  11.6 - 15.9 (g/dL)   HCT 96.0  45.4 - 09.8 (%)   Platelets 275  145 - 400 (10e3/uL)   MCV 95.0  79.5 - 101.0 (fL)   MCH 32.8  25.1 - 34.0 (pg)   MCHC 34.6  31.5 - 36.0 (g/dL)   RBC 1.19  1.47 - 8.29 (10e6/uL)   RDW 12.6  11.2 - 14.5 (%)   lymph# 1.4  0.9 - 3.3 (10e3/uL)   MONO# 0.2  0.1 - 0.9 (10e3/uL)  Eosinophils Absolute 0.1  0.0 - 0.5 (10e3/uL)   Basophils Absolute 0.0  0.0 - 0.1 (10e3/uL)   NEUT% 71.2  38.4 - 76.8 (%)   LYMPH% 23.9  14.0 - 49.7 (%)   MONO% 3.4  0.0 - 14.0 (%)   EOS% 1.2  0.0 - 7.0 (%)   BASO% 0.3  0.0 - 2.0 (%)  COMPREHENSIVE METABOLIC PANEL      Component Value Range   Sodium 139  135 - 145 (mEq/L)   Potassium 4.0  3.5 - 5.3 (mEq/L)   Chloride 101  96 - 112 (mEq/L)   CO2 24  19 - 32 (mEq/L)   Glucose, Bld 106 (*) 70 - 99 (mg/dL)   BUN 15  6 - 23 (mg/dL)   Creatinine, Ser 1.61  0.50 - 1.10 (mg/dL)   Total Bilirubin 0.3  0.3 - 1.2 (mg/dL)   Alkaline Phosphatase 50  39 - 117 (U/L)   AST 25  0 - 37 (U/L)   ALT 29  0 - 35 (U/L)   Total Protein 6.8  6.0 - 8.3 (g/dL)   Albumin 4.3  3.5 - 5.2 (g/dL)   Calcium 9.9  8.4 - 09.6 (mg/dL)    LAST BREAST IMAGING: Apr 28 2011. No evidence of malignancy.    TEACHING: I addressed the need to screen for other cancers (skin, colon). We reviewed the need  for self breast exam and instruction was given. I reiterated symptoms to report and when to call the clinic. Risk reduction strategies per NCCN guidelines were reviewed.   IMPRESSION: 1. History Stage IIA right breast cancer, diagnosed 2005 with right axillary recurrence 2011. 2. Intolerant of the aromatase inhibitors, now on tamoxifen with hot flashes the chief complaint. 3. On gabapentin for hot flashes, wishes to discontinue due to side effects. 4. Lymphedema, right arm and right breast.   PLAN: 1. Discontinue gabapentin. She will taper dose. We may consider other options for hot flash relief at next visit.  2. Return to clinic in 3 months for appointment with Dr. Welton Flakes. 3. Continue lymphedema treatment.   DISCUSSION: A total of 60 was spent with the patient. More than 50 minutes were spent on counseling regarding cancer survivor issues and coordination of follow-up care. NCCN guidelines for follow-up care were reviewed and a surveillance plan was outlined. The importance of compliance was stressed.

## 2011-05-28 ENCOUNTER — Encounter: Payer: Self-pay | Admitting: *Deleted

## 2011-05-28 ENCOUNTER — Ambulatory Visit: Payer: 59 | Admitting: Physical Therapy

## 2011-05-28 NOTE — Progress Notes (Unsigned)
Per MD, notified pt that labs "look good" 

## 2011-05-30 ENCOUNTER — Encounter: Payer: 59 | Admitting: Physical Therapy

## 2011-06-04 ENCOUNTER — Ambulatory Visit: Payer: 59 | Attending: Hematology & Oncology | Admitting: Physical Therapy

## 2011-06-04 DIAGNOSIS — M25519 Pain in unspecified shoulder: Secondary | ICD-10-CM | POA: Insufficient documentation

## 2011-06-04 DIAGNOSIS — M24519 Contracture, unspecified shoulder: Secondary | ICD-10-CM | POA: Insufficient documentation

## 2011-06-04 DIAGNOSIS — I890 Lymphedema, not elsewhere classified: Secondary | ICD-10-CM | POA: Insufficient documentation

## 2011-06-04 DIAGNOSIS — IMO0001 Reserved for inherently not codable concepts without codable children: Secondary | ICD-10-CM | POA: Insufficient documentation

## 2011-06-06 ENCOUNTER — Encounter: Payer: 59 | Admitting: Physical Therapy

## 2011-06-11 ENCOUNTER — Ambulatory Visit: Payer: 59 | Admitting: Physical Therapy

## 2011-06-13 ENCOUNTER — Encounter: Payer: 59 | Admitting: Physical Therapy

## 2011-06-16 ENCOUNTER — Other Ambulatory Visit: Payer: Self-pay | Admitting: *Deleted

## 2011-06-16 MED ORDER — BENZONATATE 100 MG PO CAPS: 100.0000 mg | ORAL_CAPSULE | Freq: Three times a day (TID) | ORAL | Status: AC | PRN

## 2011-06-18 ENCOUNTER — Encounter: Payer: 59 | Admitting: Physical Therapy

## 2011-06-20 ENCOUNTER — Encounter: Payer: 59 | Admitting: Physical Therapy

## 2011-06-25 ENCOUNTER — Encounter: Payer: 59 | Admitting: Physical Therapy

## 2011-06-28 ENCOUNTER — Other Ambulatory Visit: Payer: Self-pay | Admitting: Oncology

## 2011-07-10 ENCOUNTER — Ambulatory Visit: Payer: 59 | Attending: Hematology & Oncology | Admitting: Physical Therapy

## 2011-07-10 DIAGNOSIS — I890 Lymphedema, not elsewhere classified: Secondary | ICD-10-CM | POA: Insufficient documentation

## 2011-07-10 DIAGNOSIS — IMO0001 Reserved for inherently not codable concepts without codable children: Secondary | ICD-10-CM | POA: Insufficient documentation

## 2011-07-10 DIAGNOSIS — M25519 Pain in unspecified shoulder: Secondary | ICD-10-CM | POA: Insufficient documentation

## 2011-07-10 DIAGNOSIS — M24519 Contracture, unspecified shoulder: Secondary | ICD-10-CM | POA: Insufficient documentation

## 2011-07-23 ENCOUNTER — Ambulatory Visit: Payer: 59 | Admitting: Physical Therapy

## 2011-07-28 ENCOUNTER — Other Ambulatory Visit: Payer: Self-pay | Admitting: Oncology

## 2011-07-30 ENCOUNTER — Encounter: Payer: 59 | Admitting: Physical Therapy

## 2011-08-06 ENCOUNTER — Encounter: Payer: 59 | Admitting: Physical Therapy

## 2011-08-07 ENCOUNTER — Ambulatory Visit: Payer: 59 | Admitting: Family Medicine

## 2011-08-13 ENCOUNTER — Encounter: Payer: 59 | Admitting: Physical Therapy

## 2011-08-18 ENCOUNTER — Telehealth: Payer: Self-pay | Admitting: *Deleted

## 2011-08-18 NOTE — Telephone Encounter (Signed)
patient called in requesting to reshcedule her appointment to 09-24-2011 at 10:30am patient confirmed over the phone on 08-18-2011

## 2011-08-20 ENCOUNTER — Ambulatory Visit: Payer: 59 | Admitting: Oncology

## 2011-08-26 ENCOUNTER — Other Ambulatory Visit: Payer: Self-pay | Admitting: Oncology

## 2011-09-17 ENCOUNTER — Telehealth: Payer: Self-pay | Admitting: *Deleted

## 2011-09-17 NOTE — Telephone Encounter (Signed)
Patient confirmed over the phone the new date and time of the 11-25-2011 starting at 9:30am

## 2011-09-24 ENCOUNTER — Ambulatory Visit: Payer: 59 | Admitting: Oncology

## 2011-11-25 ENCOUNTER — Ambulatory Visit (HOSPITAL_BASED_OUTPATIENT_CLINIC_OR_DEPARTMENT_OTHER): Payer: 59 | Admitting: Oncology

## 2011-11-25 ENCOUNTER — Encounter: Payer: Self-pay | Admitting: Oncology

## 2011-11-25 ENCOUNTER — Other Ambulatory Visit (HOSPITAL_BASED_OUTPATIENT_CLINIC_OR_DEPARTMENT_OTHER): Payer: 59 | Admitting: Lab

## 2011-11-25 ENCOUNTER — Other Ambulatory Visit: Payer: Self-pay | Admitting: Medical Oncology

## 2011-11-25 VITALS — BP 137/88 | HR 99 | Temp 97.9°F | Resp 20 | Ht 63.0 in | Wt 185.9 lb

## 2011-11-25 DIAGNOSIS — C50919 Malignant neoplasm of unspecified site of unspecified female breast: Secondary | ICD-10-CM

## 2011-11-25 DIAGNOSIS — R5381 Other malaise: Secondary | ICD-10-CM

## 2011-11-25 DIAGNOSIS — K5900 Constipation, unspecified: Secondary | ICD-10-CM

## 2011-11-25 DIAGNOSIS — Z170 Estrogen receptor positive status [ER+]: Secondary | ICD-10-CM

## 2011-11-25 DIAGNOSIS — R5383 Other fatigue: Secondary | ICD-10-CM

## 2011-11-25 LAB — COMPREHENSIVE METABOLIC PANEL (CC13)
ALT: 91 U/L — ABNORMAL HIGH (ref 0–55)
CO2: 27 mEq/L (ref 22–29)
Calcium: 9.9 mg/dL (ref 8.4–10.4)
Chloride: 104 mEq/L (ref 98–107)
Sodium: 140 mEq/L (ref 136–145)
Total Protein: 7.2 g/dL (ref 6.4–8.3)

## 2011-11-25 LAB — CBC WITH DIFFERENTIAL/PLATELET
BASO%: 0.6 % (ref 0.0–2.0)
Eosinophils Absolute: 0.1 10*3/uL (ref 0.0–0.5)
HCT: 41.6 % (ref 34.8–46.6)
MCHC: 34.1 g/dL (ref 31.5–36.0)
MONO#: 0.6 10*3/uL (ref 0.1–0.9)
NEUT#: 4.7 10*3/uL (ref 1.5–6.5)
NEUT%: 68.5 % (ref 38.4–76.8)
RBC: 4.29 10*6/uL (ref 3.70–5.45)
WBC: 6.9 10*3/uL (ref 3.9–10.3)
lymph#: 1.4 10*3/uL (ref 0.9–3.3)

## 2011-11-25 MED ORDER — ALPRAZOLAM 0.5 MG PO TABS: 0.5000 mg | ORAL_TABLET | Freq: Every evening | ORAL | Status: AC | PRN

## 2011-11-25 NOTE — Progress Notes (Signed)
[name] [name] [id number] 04-28-1960 51 y.o. 11/25/2011 10:45 AM  CC  No primary provider on file. No primary provider on file.  DIAGNOSIS: 51 year old female with invasive ductal carcinoma of the right breast originally diagnosed in 2005. She subsequently had a local regional recurrence to the right axilla for which she was treated at [facility]. She is now reestablishing her care to [facility]   Past Medical History:  #1 breast cancer as above  #2 isolated seizure April 2010 negative for workup with no recurrent episodes.  #3 elevated liver enzymes.  #4 DVT of the right IJ  #5 status post radiotherapy per history of present illness.  #6 no history of autoimmune illnesses collagen vascular disorders cardiac pacemakers or implanted defibrillator's.  Past Surgical History: #1 status post lumpectomy with sentinel node biopsy 2005.  #2 status post axillary lymph node dissection of the right axilla in December 2011.  INTERVAL HISTORY: [name] [name] returns for followup visit today. She was last seen about 3-4 mmonths ago. She was prescribed tamoxifen but she has discontinued it due to side effects. She certainly does have a lot of things going on at home she is now has her mom living with her that as her mother-in-law. She is quite distressed over all of this. Apparently her mother-in-law has early dementia. [name] [name] seems to be her sole caregiver at this point since her husband is working. Patient has a lot of stress as from this. Otherwise she seems to be doing well she is not exercising however she is not eating healthy do to all of the stressors. She certainly does recognize is quite a bit. Her today to begin a good exercise program as well as relaxation. I also encouraged her again to start taking tamoxifen but at this time she is not enough state where she wants to start this again. She does state that she experiences quite a bit of side effects and therefore she is very reluctant  to start tamoxifen or any kind of antiestrogen therapy.  Past Surgical History  Procedure Date  . Breast lumpectomy 2005  . Axillary lymph node dissection Dec. 2011    Allergies: Allergies  Allergen Reactions  . Morphine And Related   . Tegaderm Ag Mesh (Silver)     Current Medications: Current Outpatient Prescriptions  Medication Sig Dispense Refill  . b complex vitamins capsule Take 1 capsule by mouth daily.      . Calcium-Magnesium-Vitamin D (CITRACAL CALCIUM+D PO) Take by mouth. 400mg  vitamin C, 500mg  vitamin D3 2 PO DAILY      . folic acid (FOLVITE) 1 MG tablet Take 1 mg by mouth daily.      . Melatonin 1 MG TABS Take 1 mg by mouth at bedtime as needed.      . gabapentin (NEURONTIN) 100 MG capsule Take 1 capsule (100 mg total) by mouth daily. Pt to start 05/03/11 neurontin 100mg  daily x 1 week then if pt tolerates to increase  Neurontin 100 mg BID.  60 capsule  1  . tamoxifen (NOLVADEX) 20 MG tablet TAKE 1 TABLET BY MOUTH DAILY  30 tablet  0   ECOG PERFORMANCE STATUS: 1 - Symptomatic but completely ambulatory  REVIEW OF SYSTEMS:    PHYSICAL EXAMINATION: Blood pressure 137/88, pulse 99, temperature 97.9 F (36.6 C), temperature source Oral, resp. rate 20, height 5\' 3"  (1.6 m), weight 185 lb 14.4 oz (84.324 kg).  NUU:VOZDG, healthy, no distress, well nourished, well developed and anxiouomegaly BACK: Back symmetric, no curvature., No  CVA tenderness, Range of motion is normal EXTREMITIES:no joint deformities, effusion, or inflammation, no edema, no clubbing, no cyanosis  NEURO: alert & oriented x 3 with fluent speech, no focal motor/sensory deficits, gait normal, reflexes normal and symmetric    STUDIES/RESULTS: No results found.   LABS:    Chemistry      Component Value Date/Time   NA 139 05/22/2011 1232   K 4.0 05/22/2011 1232   CL 101 05/22/2011 1232   CO2 24 05/22/2011 1232   BUN 15 05/22/2011 1232   CREATININE 0.93 05/22/2011 1232      Component Value  Date/Time   CALCIUM 9.9 05/22/2011 1232   ALKPHOS 50 05/22/2011 1232   AST 25 05/22/2011 1232   ALT 29 05/22/2011 1232   BILITOT 0.3 05/22/2011 1232      Lab Results  Component Value Date   WBC 6.9 11/25/2011   HGB 14.2 11/25/2011   HCT 41.6 11/25/2011   MCV 97.0 11/25/2011   PLT 302 11/25/2011    ASSESSMENT    51 year old female with previous history of 1.7 cm ER positive breast cancer originally diagnosed in 2005. At that time patient underwent a lumpectomy with sentinel node biopsy followed by adjuvant chemotherapy consisting of 4 cycles of a.c. Thereafter she was offered endocrine therapy but she declined. She however did undergo radiation therapy. In 2011 patient had a local regional axillary recurrence that was diagnosed at [facility]. The core needle biopsy revealed a high-grade invasive ductal carcinoma that was ER positive HER-2/neu negative. Patient underwent neoadjuvant chemotherapy consisting of 3 cycles of Taxol and Cytoxan but discontinued about due to toxicity and intolerance to Taxol. She then went on to have right axillary lymph node dissection the final pathology revealed 3 of 8 lymph nodes positive for invasive cancer. She received local regional radiation therapy to the excellent as well as the supraclavicular region. Thereafter she was recommended antiestrogen therapy with aromatase inhibitors. She was started on letrozole but could not tolerate it therefore after she was transitioned to Aromasin she could not tolerate that well either. And in September 2012 she was transitioned to tamoxifen which she has been using. She continues to complain of significant fatigue she is constipated from the tamoxifen and she is tired. She also complains of having peripheral neuropathies. She also has noticed anxiety disturbances. She also has noticed some lymphedema in the right upper axillary region. She is working with Lupita Leash in the outpatient rehabilitation setting for possible physical  therapy and lymphedema therapy.    PLAN:    Patient declines tamoxifen due to side effects  We discussed exercise and weight reduction  We discussed stress reduction  Thank you so much for allowing me to participate in the care of [facility]. I will continue to follow up the patient with you and assist in her care.  All questions were answered. The patient knows to call the clinic with any problems, questions or concerns. We can certainly see the patient much sooner if necessary.  I spent 25 minutes counseling the patient face to face. The total time spent in the appointment was 30 minutes.  Drue Second, MD Medical/Oncology [facility] [phone] (beeper) [phone] (Office)  11/25/2011, 10:45 AM 11/25/2011, 10:45 AM

## 2011-11-25 NOTE — Patient Instructions (Addendum)
Refer to lymphedema clinic  Diagnostic mammogram  I will see you back in 6 months

## 2011-11-26 ENCOUNTER — Telehealth: Payer: Self-pay | Admitting: *Deleted

## 2011-11-26 NOTE — Telephone Encounter (Signed)
FAXED OVER REFERRAL TO THE [facility] ON 11-25-2011 MADE PATIENT APPOINTMENT FOR MAMMOGRAM AT THE [facility] GAVE PATIENT APPOINTMENT FOR SIX MONTHS LAB AND MD

## 2011-12-18 ENCOUNTER — Ambulatory Visit: Payer: 59 | Attending: Oncology | Admitting: Physical Therapy

## 2011-12-18 DIAGNOSIS — I890 Lymphedema, not elsewhere classified: Secondary | ICD-10-CM | POA: Insufficient documentation

## 2011-12-18 DIAGNOSIS — IMO0001 Reserved for inherently not codable concepts without codable children: Secondary | ICD-10-CM | POA: Insufficient documentation

## 2011-12-22 ENCOUNTER — Ambulatory Visit: Payer: 59 | Admitting: Physical Therapy

## 2011-12-24 ENCOUNTER — Ambulatory Visit
Admission: RE | Admit: 2011-12-24 | Discharge: 2011-12-24 | Disposition: A | Discharge status: Home / Self Care | Payer: 59 | Source: Ambulatory Visit | Attending: Oncology | Admitting: Oncology

## 2011-12-24 DIAGNOSIS — C50919 Malignant neoplasm of unspecified site of unspecified female breast: Secondary | ICD-10-CM

## 2011-12-25 ENCOUNTER — Telehealth: Payer: Self-pay | Admitting: Medical Oncology

## 2011-12-25 MED ORDER — TAMOXIFEN CITRATE 20 MG PO TABS: 20.0000 mg | ORAL_TABLET | Freq: Every day | ORAL | Status: DC

## 2011-12-25 NOTE — Telephone Encounter (Signed)
Message copied by Tylene Fantasia on Thu Dec 25, 2011 10:29 AM ------      Message from: Victorino December      Created: Wed Dec 24, 2011  5:10 PM       Please call patient: [name] [name] looks good

## 2011-12-25 NOTE — Telephone Encounter (Signed)
She should go back to tamoxifen.  Can send her more prescription tamoxifen 20 mg daily #30/12 refills

## 2011-12-25 NOTE — Telephone Encounter (Signed)
Per MD, patients mammogram looks good.  Patient wanted to let Dr. Welton Flakes know that she was reconsidering going back on the Tamoxifen and wanted to know Dr. Milta Deiters opinion as to whether this was still safe for her to do.  She has enough pills to take for about two weeks but after that would need a new prescription if she continued to take it.  Advised patient that I would let Dr. Welton Flakes know and would return a call back to her with MD's recommendation.  Patient expressed understanding, no further questions at this time.

## 2011-12-25 NOTE — Telephone Encounter (Signed)
Per MD, patient to go back on the Tamoxifen.  Advised patient that I would send pharmacy a new prescription as Dr. Welton Flakes has written. Patient expressed understanding, instructed patient to call clinic with any questions or concerns.  No further questions at this time.

## 2011-12-31 ENCOUNTER — Ambulatory Visit: Payer: 59 | Attending: Oncology | Admitting: Physical Therapy

## 2011-12-31 DIAGNOSIS — I890 Lymphedema, not elsewhere classified: Secondary | ICD-10-CM | POA: Insufficient documentation

## 2011-12-31 DIAGNOSIS — IMO0001 Reserved for inherently not codable concepts without codable children: Secondary | ICD-10-CM | POA: Insufficient documentation

## 2012-01-05 ENCOUNTER — Ambulatory Visit: Payer: 59 | Admitting: Physical Therapy

## 2012-01-07 ENCOUNTER — Ambulatory Visit: Payer: 59 | Admitting: Physical Therapy

## 2012-01-12 ENCOUNTER — Ambulatory Visit: Payer: 59 | Admitting: Physical Therapy

## 2012-01-14 ENCOUNTER — Encounter: Payer: 59 | Admitting: Physical Therapy

## 2012-01-16 ENCOUNTER — Ambulatory Visit: Payer: 59 | Admitting: Physical Therapy

## 2012-01-19 ENCOUNTER — Telehealth: Payer: Self-pay | Admitting: *Deleted

## 2012-01-19 ENCOUNTER — Telehealth: Payer: Self-pay | Admitting: Oncology

## 2012-01-19 NOTE — Telephone Encounter (Signed)
S/w the pt and she is aware of her appt on Friday  With dr Milta Deiters pa on 01/23/2012

## 2012-01-19 NOTE — Telephone Encounter (Signed)
Per MD, notified pt to expect a call from scheduling regarding f/u appt with MD/Midlevel.

## 2012-01-19 NOTE — Telephone Encounter (Signed)
See me or Mardella Layman next available please

## 2012-01-19 NOTE — Telephone Encounter (Signed)
Pt called states " I'm having pain on my right side, (breast, shoulder,axilla) and the pain goes to my ribs and wraps around. I'm going to the lymphedema clinic, 2x week but I'm wondering if something else is going on.I just don't feel right. I know some of it is from the radiation because it was in the same area, because I had full axillary dissection on the right side. I had a PET scan Jan 2012 and it was clean but I'm not sure. I went back on the tamoxifen for a few weeks but I had to come off of it because I couldn't walk, the nueuropathy in my feet was so bad. So I stopped taking the tamoxifen. I  know I nee to be on something. But I cant take that.pt experiencing hot flashes and general feelings of " unwellness" Pt report havig been on effexor but had to go to ED due to " getting so sick on it" Discussed with pt, her concerns will be forwarded to Provider for review. Next f/u 3/34/14

## 2012-01-21 ENCOUNTER — Ambulatory Visit: Payer: 59 | Admitting: Physical Therapy

## 2012-01-23 ENCOUNTER — Telehealth: Payer: Self-pay | Admitting: Oncology

## 2012-01-23 ENCOUNTER — Encounter: Payer: Self-pay | Admitting: Adult Health

## 2012-01-23 ENCOUNTER — Ambulatory Visit (HOSPITAL_BASED_OUTPATIENT_CLINIC_OR_DEPARTMENT_OTHER): Payer: 59 | Admitting: Adult Health

## 2012-01-23 ENCOUNTER — Ambulatory Visit: Payer: 59 | Admitting: Physical Therapy

## 2012-01-23 VITALS — BP 136/87 | HR 92 | Temp 97.5°F | Resp 20 | Ht 63.0 in | Wt 185.5 lb

## 2012-01-23 DIAGNOSIS — C50419 Malignant neoplasm of upper-outer quadrant of unspecified female breast: Secondary | ICD-10-CM

## 2012-01-23 DIAGNOSIS — C50919 Malignant neoplasm of unspecified site of unspecified female breast: Secondary | ICD-10-CM

## 2012-01-23 DIAGNOSIS — Z86718 Personal history of other venous thrombosis and embolism: Secondary | ICD-10-CM

## 2012-01-23 DIAGNOSIS — C773 Secondary and unspecified malignant neoplasm of axilla and upper limb lymph nodes: Secondary | ICD-10-CM

## 2012-01-23 MED ORDER — ALPRAZOLAM 0.5 MG PO TABS: 0.5000 mg | ORAL_TABLET | Freq: Every evening | ORAL | Status: DC | PRN

## 2012-01-23 NOTE — Telephone Encounter (Signed)
gve the pt her nov pet scan appt along with the jan 2014 appt calendar

## 2012-01-23 NOTE — Progress Notes (Signed)
[name] [name] [id number] 1960/08/10 51 y.o. 01/23/2012 1:45 PM  CC  No primary provider on file. No primary provider on file.  DIAGNOSIS: 51 year old female with invasive ductal carcinoma of the right breast originally diagnosed in 2005. She subsequently had a local regional recurrence to the right axilla for which she was treated at [facility]. She is now reestablishing her care to  cancer Center   Past Medical History:  #1 breast cancer as above  #2 isolated seizure April 2010 negative for workup with no recurrent episodes.  #3 elevated liver enzymes.  #4 DVT of the right IJ  #5 status post radiotherapy per history of present illness.  #6 no history of autoimmune illnesses collagen vascular disorders cardiac pacemakers or implanted defibrillator's.  Past Surgical History: #1 status post lumpectomy with sentinel node biopsy 2005.  #2 status post axillary lymph node dissection of the right axilla in December 2011.  INTERVAL HISTORY: [name] [name] returns for followup visit today. We last saw her in August.  However, since then she has been experiencing right axillary pain that has been keeping her at night.  This is located in the same place as her recurrence.  She saw Elka a Physical therapy assistant who worked with her, attributing the pain to scar tissue, however Donesha remains concerned about a possible recurrence.     Past Surgical History  Procedure Date  . Breast lumpectomy 2005  . Axillary lymph node dissection Dec. 2011    Allergies: Allergies  Allergen Reactions  . Morphine And Related   . Tegaderm Ag Mesh (Silver)     Current Medications: Current Outpatient Prescriptions  Medication Sig Dispense Refill  . b complex vitamins capsule Take 1 capsule by mouth daily.      . Calcium-Magnesium-Vitamin D (CITRACAL CALCIUM+D PO) Take by mouth. 400mg  vitamin C, 500mg  vitamin D3 2 PO DAILY      . folic acid (FOLVITE) 1 MG tablet Take 1 mg by mouth daily.      Marland Kitchen  gabapentin (NEURONTIN) 100 MG capsule Take 1 capsule (100 mg total) by mouth daily. Pt to start 05/03/11 neurontin 100mg  daily x 1 week then if pt tolerates to increase  Neurontin 100 mg BID.  60 capsule  1  . Melatonin 1 MG TABS Take 1 mg by mouth at bedtime as needed.      . tamoxifen (NOLVADEX) 20 MG tablet Take 1 tablet (20 mg total) by mouth daily.  30 tablet  12   Health Maintenance Mammogram: 11/2011 Colonoscopy: never Bone Density Scan: 2012 Pap Smear: not recent Eye Exam: 12/2011 Vitamin D Level: n/a Lipid Panel:  2011  REVIEW OF SYSTEMS: General: fatigue (-), night sweats (-), fever (-), pain (+) Lymph: palpable nodes (-) HEENT: vision changes (-), mucositis (-), gum bleeding (-), epistaxis (-) Cardiovascular: chest pain (-), palpitations (-) Pulmonary: shortness of breath (-), dyspnea on exertion (-), cough (-), hemoptysis (-) GI:  Early satiety (-), melena (-), dysphagia (-), nausea/vomiting (-), diarrhea (-) GU: dysuria (-), hematuria (-), incontinence (-) Musculoskeletal: joint swelling (-), joint pain (-), back pain (-) Neuro: weakness (-), numbness (-), headache (-), confusion (-) Skin: Rash (-), lesions (-), dryness (-) Psych: depression (-), suicidal/homicidal ideation (-), feeling of hopelessness (-)  PHYSICAL EXAMINATION: Blood pressure 136/87, pulse 92, temperature 97.5 F (36.4 C), temperature source Oral, resp. rate 20, height 5\' 3"  (1.6 m), weight 185 lb 8 oz (84.142 kg). General: Patient is a well appearing female in no acute distress HEENT: PERRLA, sclerae anicteric  no conjunctival pallor, MMM Neck: supple, no palpable adenopathy Lungs: clear to auscultation bilaterally, no wheezes, rhonchi, or rales Cardiovascular: regular rate rhythm, S1, S2, no murmurs, rubs or gallops Abdomen: Soft, non-tender, non-distended, normoactive bowel sounds, no HSM Extremities: warm and well perfused, no clubbing, cyanosis, or edema Skin: No rashes or lesions Neuro:  Non-focal ECOG-1 Right breast: mastectomy site well healed, axillary scar healed, no nodularity  LABS:    Chemistry      Component Value Date/Time   NA 140 11/25/2011 0954   NA 139 05/22/2011 1232   K 4.2 11/25/2011 0954   K 4.0 05/22/2011 1232   CL 104 11/25/2011 0954   CL 101 05/22/2011 1232   CO2 27 11/25/2011 0954   CO2 24 05/22/2011 1232   BUN 12.0 11/25/2011 0954   BUN 15 05/22/2011 1232   CREATININE 0.8 11/25/2011 0954   CREATININE 0.93 05/22/2011 1232      Component Value Date/Time   CALCIUM 9.9 11/25/2011 0954   CALCIUM 9.9 05/22/2011 1232   ALKPHOS 71 11/25/2011 0954   ALKPHOS 50 05/22/2011 1232   AST 45* 11/25/2011 0954   AST 25 05/22/2011 1232   ALT 91* 11/25/2011 0954   ALT 29 05/22/2011 1232   BILITOT 0.50 11/25/2011 0954   BILITOT 0.3 05/22/2011 1232      Lab Results  Component Value Date   WBC 6.9 11/25/2011   HGB 14.2 11/25/2011   HCT 41.6 11/25/2011   MCV 97.0 11/25/2011   PLT 302 11/25/2011    ASSESSMENT    51 year old female with previous history of 1.7 cm ER positive breast cancer originally diagnosed in 2005. At that time patient underwent a lumpectomy with sentinel node biopsy followed by adjuvant chemotherapy consisting of 4 cycles of a.c. Thereafter she was offered endocrine therapy but she declined. She however did undergo radiation therapy. In 2011 patient had a local regional axillary recurrence that was diagnosed at [facility]. The core needle biopsy revealed a high-grade invasive ductal carcinoma that was ER positive HER-2/neu negative. Patient underwent neoadjuvant chemotherapy consisting of 3 cycles of Taxol and Cytoxan but discontinued about due to toxicity and intolerance to Taxol. She then went on to have right axillary lymph node dissection the final pathology revealed 3 of 8 lymph nodes positive for invasive cancer. She received local regional radiation therapy to the excellent as well as the supraclavicular region. Thereafter she was recommended  antiestrogen therapy with aromatase inhibitors. She was started on letrozole but could not tolerate it therefore after she was transitioned to Aromasin she could not tolerate that well either. And in September 2012 she was transitioned to tamoxifen which she has been using. She continues to complain of significant fatigue she is constipated from the tamoxifen and she is tired. She also complains of having peripheral neuropathies. She also has noticed anxiety disturbances. She also has noticed some lymphedema in the right upper axillary region. She is working with Lupita Leash in the outpatient rehabilitation setting for possible physical therapy and lymphedema therapy.    PLAN:    Ms. Masci continues to have pain in her right axillary region.  We will do a PET/CT to rule out recurrence.  We refilled her Xanax, and encouraged, her to continue working with PT.  We will see her back in 2 months.    All questions were answered. The patient knows to call the clinic with any problems, questions or concerns. We can certainly see the patient much sooner if necessary.  I spent  25 minutes counseling the patient face to face. The total time spent in the appointment was 30 minutes.  Drue Second, MD Medical/Oncology [facility] [phone] (beeper) [phone] (Office)  01/23/2012, 1:45 PM 01/23/2012, 1:45 PM

## 2012-01-26 ENCOUNTER — Ambulatory Visit: Payer: 59 | Admitting: Physical Therapy

## 2012-01-28 ENCOUNTER — Ambulatory Visit: Payer: 59 | Admitting: Physical Therapy

## 2012-01-29 ENCOUNTER — Ambulatory Visit: Payer: 59 | Admitting: Physical Therapy

## 2012-01-30 ENCOUNTER — Telehealth: Payer: Self-pay | Admitting: Oncology

## 2012-01-30 NOTE — Telephone Encounter (Signed)
lmonvm adviisng the pt that her appts on 05/31/2012 has been cancelled and r/s to 06/14/2012@10 :00am due tom a change in the md's schedule.

## 2012-02-02 ENCOUNTER — Encounter (HOSPITAL_COMMUNITY): Payer: 59

## 2012-02-02 ENCOUNTER — Ambulatory Visit: Payer: Self-pay | Admitting: Oncology

## 2012-02-02 ENCOUNTER — Ambulatory Visit: Payer: 59 | Attending: Oncology | Admitting: Physical Therapy

## 2012-02-02 DIAGNOSIS — IMO0001 Reserved for inherently not codable concepts without codable children: Secondary | ICD-10-CM | POA: Insufficient documentation

## 2012-02-02 DIAGNOSIS — I890 Lymphedema, not elsewhere classified: Secondary | ICD-10-CM | POA: Insufficient documentation

## 2012-02-04 ENCOUNTER — Telehealth: Payer: Self-pay | Admitting: *Deleted

## 2012-02-04 ENCOUNTER — Encounter: Payer: Self-pay | Admitting: *Deleted

## 2012-02-04 NOTE — Telephone Encounter (Signed)
Call from pt, lmovm states " I had a PET scan  And would like to know my results" Returned pt's call. Unable to reach pt, lmovm MD will go over scan results at next office visit on 02/10/12 at 0900. Requested pt call back to confirm message has been received.

## 2012-02-04 NOTE — Telephone Encounter (Signed)
Placed patient on the schedule for 02-10-2012 at 9:00am per orders from 02-04-2012

## 2012-02-05 ENCOUNTER — Telehealth: Payer: Self-pay | Admitting: *Deleted

## 2012-02-05 NOTE — Telephone Encounter (Signed)
Pt called states " I don't know if you know my medical history but I've been through the ringer. I'd like Dr. Welton Flakes to call me about my scan results." Discussed with pt MD is on call today and currently in hospital rounding on pts. I will pass pt concerns to MD. Pt states " I would really like her to just call me, I can't wait until Tuesday to talk to her." Informed pt I will give MD message. No further questions.

## 2012-02-05 NOTE — Telephone Encounter (Signed)
Pt called lmovm requesting results from her scan. Returned pt's call, MD would like to see pt on  11/12 @ 0900 and will go over her results at that time. Pt verbalized understanding.

## 2012-02-06 ENCOUNTER — Ambulatory Visit: Payer: 59

## 2012-02-06 NOTE — Telephone Encounter (Signed)
Pt called states " I would really like to speak with Dr. Welton Flakes about my results." Again discussed with pt I have given MD all messages she has left regarding her scan. I will be happy to forward today's request to MD. Pt verbalized understanding. No further questions.

## 2012-02-06 NOTE — Telephone Encounter (Signed)
Let patient know that I will call her later today

## 2012-02-06 NOTE — Telephone Encounter (Signed)
Notified pt MD will call her later today. Pt verbalized she had already s/w MD, to cancel upcoming appt and she will be by next week to pick up written results of scan. Informed pt they will be at front desk in envelope with her name on it. Pt verbalized understanding. No further questions

## 2012-02-10 ENCOUNTER — Ambulatory Visit: Payer: 59 | Admitting: Physical Therapy

## 2012-02-10 ENCOUNTER — Ambulatory Visit: Payer: 59 | Admitting: Oncology

## 2012-02-11 ENCOUNTER — Telehealth: Payer: Self-pay | Admitting: *Deleted

## 2012-02-11 NOTE — Telephone Encounter (Signed)
Pt called states " I picked up my results and  I'm  A little confused after reading them. The last comment on the results says " there is metabolic activity in the liver".  I'm worried because when I was at [facility], I only got 3 out of 4 treatments because of liver toxicity. Then it says "the risk of metastatic disease is worrisome." Dr. Welton Flakes she she felt like the Tamoxifen would take care of this. Does my liver have this because of the radiation or when I had liver toxicity, or is it something else?" Reverbaalized above statement back to pt to clarify her concerns. Pt agreed above statement is correct. Informed pt I will pass her concerns to MD for review.

## 2012-02-12 ENCOUNTER — Ambulatory Visit: Payer: 59 | Admitting: Physical Therapy

## 2012-02-13 ENCOUNTER — Ambulatory Visit: Payer: 59

## 2012-02-17 ENCOUNTER — Ambulatory Visit: Payer: 59 | Admitting: Physical Therapy

## 2012-02-20 ENCOUNTER — Encounter: Payer: Self-pay | Admitting: Oncology

## 2012-02-20 ENCOUNTER — Ambulatory Visit: Payer: 59

## 2012-02-23 ENCOUNTER — Ambulatory Visit: Payer: 59 | Admitting: Physical Therapy

## 2012-02-24 ENCOUNTER — Ambulatory Visit: Payer: 59

## 2012-02-25 ENCOUNTER — Ambulatory Visit: Payer: 59 | Attending: Oncology | Admitting: Physical Therapy

## 2012-02-25 DIAGNOSIS — IMO0001 Reserved for inherently not codable concepts without codable children: Secondary | ICD-10-CM | POA: Insufficient documentation

## 2012-02-25 DIAGNOSIS — I890 Lymphedema, not elsewhere classified: Secondary | ICD-10-CM | POA: Insufficient documentation

## 2012-03-08 ENCOUNTER — Ambulatory Visit: Payer: 59 | Attending: Oncology

## 2012-03-08 DIAGNOSIS — I890 Lymphedema, not elsewhere classified: Secondary | ICD-10-CM | POA: Insufficient documentation

## 2012-03-08 DIAGNOSIS — IMO0001 Reserved for inherently not codable concepts without codable children: Secondary | ICD-10-CM | POA: Insufficient documentation

## 2012-03-19 ENCOUNTER — Ambulatory Visit: Payer: 59 | Attending: Oncology

## 2012-03-19 DIAGNOSIS — I890 Lymphedema, not elsewhere classified: Secondary | ICD-10-CM | POA: Insufficient documentation

## 2012-03-19 DIAGNOSIS — IMO0001 Reserved for inherently not codable concepts without codable children: Secondary | ICD-10-CM | POA: Insufficient documentation

## 2012-03-26 ENCOUNTER — Ambulatory Visit: Payer: 59

## 2012-04-07 ENCOUNTER — Ambulatory Visit (HOSPITAL_BASED_OUTPATIENT_CLINIC_OR_DEPARTMENT_OTHER): Payer: 59 | Admitting: Oncology

## 2012-04-07 ENCOUNTER — Telehealth: Payer: Self-pay | Admitting: Oncology

## 2012-04-07 ENCOUNTER — Encounter: Payer: Self-pay | Admitting: Oncology

## 2012-04-07 VITALS — BP 154/108 | HR 91 | Temp 97.8°F | Resp 20 | Ht 63.0 in | Wt 180.7 lb

## 2012-04-07 DIAGNOSIS — C50419 Malignant neoplasm of upper-outer quadrant of unspecified female breast: Secondary | ICD-10-CM

## 2012-04-07 DIAGNOSIS — G609 Hereditary and idiopathic neuropathy, unspecified: Secondary | ICD-10-CM

## 2012-04-07 DIAGNOSIS — R5383 Other fatigue: Secondary | ICD-10-CM

## 2012-04-07 DIAGNOSIS — C50919 Malignant neoplasm of unspecified site of unspecified female breast: Secondary | ICD-10-CM

## 2012-04-07 DIAGNOSIS — C773 Secondary and unspecified malignant neoplasm of axilla and upper limb lymph nodes: Secondary | ICD-10-CM

## 2012-04-07 DIAGNOSIS — R5381 Other malaise: Secondary | ICD-10-CM

## 2012-04-07 NOTE — Patient Instructions (Addendum)
Continue tamoxifen 20 mg daily  Proceed with blood work and CT scan

## 2012-04-07 NOTE — Telephone Encounter (Signed)
gv pt appt schedule for February and prep for ct. Pt aware central will contact her re a ct appt for approx 2/10.

## 2012-04-09 ENCOUNTER — Ambulatory Visit: Payer: 59 | Attending: Oncology | Admitting: Physical Therapy

## 2012-04-09 DIAGNOSIS — I890 Lymphedema, not elsewhere classified: Secondary | ICD-10-CM | POA: Insufficient documentation

## 2012-04-09 DIAGNOSIS — IMO0001 Reserved for inherently not codable concepts without codable children: Secondary | ICD-10-CM | POA: Insufficient documentation

## 2012-04-19 NOTE — Progress Notes (Signed)
[name] [name] [id number] October 26, 1960 52 y.o. 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 She subsequently had a local regional recurrence to the right axilla for which she was treated at [facility].  Past Medical History:  #1 breast cancer as above  #2 isolated seizure April 2010 negative for workup with no recurrent episodes.  #3 elevated liver enzymes.  #4 DVT of the right IJ  #5 status post radiotherapy per history of present illness.  #6 no history of autoimmune illnesses collagen vascular disorders cardiac pacemakers or implanted defibrillator's.  Past Surgical History: #1 status post lumpectomy with sentinel node biopsy 2005.  #2 status post axillary lymph node dissection of the right axilla in December 2011.  INTERVAL HISTORY: [name] [name] returns for followup visit today. She and I had discussed her PET scan results over the phone. I have recommended to her that she needs to begin tamoxifen 20 mg daily. Which she has begun taking. She does have fatigue hot flashes. She tells me that she is doing the best you possibly can.She is denying any nausea vomiting fevers chills . She has no myalgias or arthralgias. She does continue to have pain in her right breast. She is being seen by physical therapy. Remainder of the 10 point review of systems is unremarkable.  Past Surgical History  Procedure Date  . Breast lumpectomy 2005  . Axillary lymph node dissection Dec. 2011    Allergies: Allergies  Allergen Reactions  . Morphine And Related   . Tegaderm Ag Mesh (Silver)     Current Medications: Current Outpatient Prescriptions  Medication Sig Dispense Refill  . ALPRAZolam (XANAX) 0.5 MG tablet Take 1 tablet (0.5 mg total) by mouth at bedtime as needed for sleep.  30 tablet  5  . b complex vitamins capsule Take 1 capsule by mouth daily.      .  Calcium-Magnesium-Vitamin D (CITRACAL CALCIUM+D PO) Take by mouth. 400mg  vitamin C, 500mg  vitamin D3 2 PO DAILY      . folic acid (FOLVITE) 1 MG tablet Take 1 mg by mouth daily.      Marland Kitchen guaiFENesin (MUCINEX) 600 MG 12 hr tablet Take 1,200 mg by mouth 2 (two) times daily.      . Melatonin 1 MG TABS Take 1 mg by mouth at bedtime as needed.      . tamoxifen (NOLVADEX) 20 MG tablet Take 1 tablet (20 mg total) by mouth daily.  30 tablet  12   Health Maintenance Mammogram: 11/2011 Colonoscopy: never Bone Density Scan: 2012 Pap Smear: not recent Eye Exam: 12/2011 Vitamin D Level: n/a Lipid Panel:  2011  REVIEW OF SYSTEMS: General: fatigue (-), night sweats (-), fever (-), pain (+) Lymph: palpable nodes (-) HEENT: vision changes (-), mucositis (-), gum bleeding (-), epistaxis (-) Cardiovascular: chest pain (-), palpitations (-) Pulmonary: shortness of breath (-), dyspnea on exertion (-), cough (-), hemoptysis (-) GI:  Early satiety (-), melena (-), dysphagia (-), nausea/vomiting (-), diarrhea (-) GU: dysuria (-), hematuria (-), incontinence (-) Musculoskeletal: joint swelling (-), joint pain (-), back pain (-) Neuro: weakness (-), numbness (-), headache (-), confusion (-) Skin: Rash (-), lesions (-), dryness (-) Psych: depression (-), suicidal/homicidal ideation (-), feeling of hopelessness (-)  PHYSICAL EXAMINATION: Blood pressure 154/108, pulse 91, temperature 97.8 F (36.6 C), resp. rate 20, height 5\' 3"  (1.6 m), weight 180 lb 11.2 oz (81.965 kg).  General: Patient is a well appearing female in no acute distress HEENT: PERRLA, sclerae anicteric no conjunctival pallor, MMM Neck: supple, no palpable adenopathy Lungs: clear to auscultation bilaterally, no wheezes, rhonchi, or rales Cardiovascular: regular rate rhythm, S1, S2, no murmurs, rubs or gallops Abdomen: Soft, non-tender, non-distended, normoactive bowel sounds, no HSM Extremities: warm and well perfused, no clubbing, cyanosis, or  edema Skin: No rashes or lesions Neuro: Non-focal ECOG-1 Right breast: mastectomy site well healed, axillary scar healed, no nodularity  LABS:    Chemistry      Component Value Date/Time   NA 140 11/25/2011 0954   NA 139 05/22/2011 1232   K 4.2 11/25/2011 0954   K 4.0 05/22/2011 1232   CL 104 11/25/2011 0954   CL 101 05/22/2011 1232   CO2 27 11/25/2011 0954   CO2 24 05/22/2011 1232   BUN 12.0 11/25/2011 0954   BUN 15 05/22/2011 1232   CREATININE 0.8 11/25/2011 0954   CREATININE 0.93 05/22/2011 1232      Component Value Date/Time   CALCIUM 9.9 11/25/2011 0954   CALCIUM 9.9 05/22/2011 1232   ALKPHOS 71 11/25/2011 0954   ALKPHOS 50 05/22/2011 1232   AST 45* 11/25/2011 0954   AST 25 05/22/2011 1232   ALT 91* 11/25/2011 0954   ALT 29 05/22/2011 1232   BILITOT 0.50 11/25/2011 0954   BILITOT 0.3 05/22/2011 1232      Lab Results  Component Value Date   WBC 6.9 11/25/2011   HGB 14.2 11/25/2011   HCT 41.6 11/25/2011   MCV 97.0 11/25/2011   PLT 302 11/25/2011    ASSESSMENT    52 year old female with   1. previous history of 1.7 cm ER positive breast cancer originally diagnosed in 2005. At that time patient underwent a lumpectomy with sentinel node biopsy followed by adjuvant chemotherapy consisting of 4 cycles of a.c. Thereafter she was offered endocrine therapy but she declined. She however did undergo radiation therapy. In 2011 patient had a local regional axillary recurrence that was diagnosed at [facility]. The core needle biopsy revealed a high-grade invasive ductal carcinoma that was ER positive HER-2/neu negative. Patient underwent neoadjuvant chemotherapy consisting of 3 cycles of Taxol and Cytoxan but discontinued about due to toxicity and intolerance to Taxol. She then went on to have right axillary lymph node dissection the final pathology revealed 3 of 8 lymph nodes positive for invasive cancer. She received local regional radiation therapy to the excellent as well as the  supraclavicular region. Thereafter she was recommended antiestrogen therapy with aromatase inhibitors. She was started on letrozole but could not tolerate it therefore after she was transitioned to Aromasin she could not tolerate that well either. And in September 2012 she was transitioned to tamoxifen which she has been using. She continues to complain of significant fatigue she is constipated from the tamoxifen and she is tired. She also complains of having peripheral neuropathies. She also has noticed anxiety disturbances. She also has noticed some lymphedema in the right upper axillary region. She is working with Lupita Leash in the outpatient rehabilitation setting for possible physical therapy and lymphedema therapy.  #2 patient and I discussed her PET scan results. There is some abnormal FDG uptake concerning. She and I discussed this today. She has not been compliant with her endocrine therapy and I therefore did recommend over the phone a few weeks ago that she start her tamoxifen 20 mg on a daily basis. She has agreed to do this.    PLAN:   #  1 continue tamoxifen 20 mg daily.  #2 we will plan on doing another set of scans in about 2-3 months time.  3 she will return in 3 months time for followup.  All questions were answered. The patient knows to call the clinic with any problems, questions or concerns. We can certainly see the patient much sooner if necessary.  I spent 25 minutes counseling the patient face to face. The total time spent in the appointment was 30 minutes.  Drue Second, MD Medical/Oncology [facility] [phone] (beeper) [phone] (Office)

## 2012-04-21 ENCOUNTER — Ambulatory Visit: Payer: 59 | Admitting: Physical Therapy

## 2012-04-22 ENCOUNTER — Ambulatory Visit: Payer: 59 | Admitting: Physical Therapy

## 2012-05-05 ENCOUNTER — Encounter: Payer: 59 | Admitting: Physical Therapy

## 2012-05-06 ENCOUNTER — Inpatient Hospital Stay
Admission: RE | Admit: 2012-05-06 | Discharge: 2012-05-06 | Disposition: A | Discharge status: Home / Self Care | Payer: Self-pay | Source: Ambulatory Visit | Attending: Oncology | Admitting: Oncology

## 2012-05-06 ENCOUNTER — Other Ambulatory Visit: Payer: Self-pay | Admitting: Oncology

## 2012-05-06 ENCOUNTER — Other Ambulatory Visit (HOSPITAL_BASED_OUTPATIENT_CLINIC_OR_DEPARTMENT_OTHER): Payer: 59 | Admitting: Lab

## 2012-05-06 DIAGNOSIS — C50919 Malignant neoplasm of unspecified site of unspecified female breast: Secondary | ICD-10-CM

## 2012-05-06 LAB — CBC WITH DIFFERENTIAL/PLATELET
Basophils Absolute: 0 10*3/uL (ref 0.0–0.1)
Eosinophils Absolute: 0.1 10*3/uL (ref 0.0–0.5)
HCT: 38.8 % (ref 34.8–46.6)
HGB: 13.3 g/dL (ref 11.6–15.9)
MCH: 32.4 pg (ref 25.1–34.0)
MONO#: 0.4 10*3/uL (ref 0.1–0.9)
NEUT%: 71.7 % (ref 38.4–76.8)
lymph#: 1.5 10*3/uL (ref 0.9–3.3)

## 2012-05-06 LAB — COMPREHENSIVE METABOLIC PANEL (CC13)
Albumin: 4 g/dL (ref 3.5–5.0)
BUN: 18.2 mg/dL (ref 7.0–26.0)
CO2: 23 mEq/L (ref 22–29)
Calcium: 9.6 mg/dL (ref 8.4–10.4)
Chloride: 105 mEq/L (ref 98–107)
Glucose: 124 mg/dl — ABNORMAL HIGH (ref 70–99)
Potassium: 4.3 mEq/L (ref 3.5–5.1)

## 2012-05-07 ENCOUNTER — Other Ambulatory Visit: Payer: 59

## 2012-05-07 ENCOUNTER — Other Ambulatory Visit (HOSPITAL_COMMUNITY): Payer: 59

## 2012-05-10 ENCOUNTER — Ambulatory Visit (HOSPITAL_COMMUNITY)
Admission: RE | Admit: 2012-05-10 | Discharge: 2012-05-10 | Disposition: A | Discharge status: Home / Self Care | Payer: 59 | Source: Ambulatory Visit | Attending: Oncology | Admitting: Oncology

## 2012-05-10 DIAGNOSIS — R918 Other nonspecific abnormal finding of lung field: Secondary | ICD-10-CM | POA: Insufficient documentation

## 2012-05-10 DIAGNOSIS — Z9221 Personal history of antineoplastic chemotherapy: Secondary | ICD-10-CM | POA: Insufficient documentation

## 2012-05-10 DIAGNOSIS — Z923 Personal history of irradiation: Secondary | ICD-10-CM | POA: Insufficient documentation

## 2012-05-10 DIAGNOSIS — C50919 Malignant neoplasm of unspecified site of unspecified female breast: Secondary | ICD-10-CM

## 2012-05-10 MED ORDER — IOHEXOL 300 MG/ML  SOLN
100.0000 mL | Freq: Once | INTRAMUSCULAR | Status: AC | PRN
  Administered 2012-05-10: 100 mL via INTRAVENOUS

## 2012-05-11 ENCOUNTER — Ambulatory Visit: Payer: 59 | Attending: Oncology | Admitting: Physical Therapy

## 2012-05-11 DIAGNOSIS — I890 Lymphedema, not elsewhere classified: Secondary | ICD-10-CM | POA: Insufficient documentation

## 2012-05-11 DIAGNOSIS — IMO0001 Reserved for inherently not codable concepts without codable children: Secondary | ICD-10-CM | POA: Insufficient documentation

## 2012-05-12 ENCOUNTER — Telehealth: Payer: Self-pay | Admitting: Emergency Medicine

## 2012-05-12 ENCOUNTER — Encounter: Payer: 59 | Admitting: Physical Therapy

## 2012-05-12 NOTE — Telephone Encounter (Signed)
Patient called inquiring about results of recent CT studies.  Patient calling to cancel 2/13 follow up appointment due to weather and would like to be rescheduled ASAP.

## 2012-05-13 ENCOUNTER — Telehealth: Payer: Self-pay | Admitting: *Deleted

## 2012-05-13 ENCOUNTER — Ambulatory Visit: Payer: 59 | Admitting: Oncology

## 2012-05-13 NOTE — Telephone Encounter (Signed)
[name] [name] called asking if she does have to come in today at 1:30 pm.  More snow is coming at the time I'm to come.  Would like to have Dr. Welton Flakes call her to give results and tamoxifen instructions.  "If someone would call me and let me know if I will receive a call or do I need to try to come in at 1:30 I would appreciate it."  Will notify providers.  [name] [name] can be reached at [phone].

## 2012-05-13 NOTE — Telephone Encounter (Signed)
Dr. Welton Flakes is not in oiice today due to weather.  Called Caleen to notify her not to come in today.  Dr. Milta Deiters partner will assess what further steps to take.  Collaborative nurse aware and will call patient with any new orders.

## 2012-05-15 ENCOUNTER — Other Ambulatory Visit: Payer: Self-pay

## 2012-05-19 ENCOUNTER — Encounter: Payer: 59 | Admitting: Physical Therapy

## 2012-05-25 ENCOUNTER — Encounter: Payer: 59 | Admitting: *Deleted

## 2012-05-26 ENCOUNTER — Ambulatory Visit: Payer: 59 | Admitting: Physical Therapy

## 2012-05-28 ENCOUNTER — Telehealth: Payer: Self-pay | Admitting: Oncology

## 2012-05-28 NOTE — Telephone Encounter (Signed)
Per email response from [facility] pt is correct appt for March should be cx'd and pt to f/u in August. S/w pt she is aware and has appt for 8/28.

## 2012-05-31 ENCOUNTER — Other Ambulatory Visit: Payer: 59 | Admitting: Lab

## 2012-05-31 ENCOUNTER — Ambulatory Visit: Payer: 59 | Admitting: Oncology

## 2012-06-02 ENCOUNTER — Encounter: Payer: 59 | Admitting: Physical Therapy

## 2012-06-09 ENCOUNTER — Ambulatory Visit: Payer: 59 | Attending: Oncology | Admitting: Physical Therapy

## 2012-06-09 DIAGNOSIS — IMO0001 Reserved for inherently not codable concepts without codable children: Secondary | ICD-10-CM | POA: Insufficient documentation

## 2012-06-09 DIAGNOSIS — I890 Lymphedema, not elsewhere classified: Secondary | ICD-10-CM | POA: Insufficient documentation

## 2012-06-14 ENCOUNTER — Ambulatory Visit: Payer: 59 | Admitting: Oncology

## 2012-06-14 ENCOUNTER — Other Ambulatory Visit: Payer: 59 | Admitting: Lab

## 2012-06-16 ENCOUNTER — Encounter: Payer: 59 | Admitting: Physical Therapy

## 2012-06-23 ENCOUNTER — Ambulatory Visit: Payer: 59 | Admitting: Physical Therapy

## 2012-06-28 ENCOUNTER — Telehealth: Payer: Self-pay | Admitting: Medical Oncology

## 2012-06-28 NOTE — Telephone Encounter (Signed)
Patient called to report having "horrible constipation, very sore with bright red blood when having bowel movements. Blood stops after bowel movement. I've been drinking lots of water, been taking dulcolax everyday and have done a sitz bath." Patient states that she constipation r/t tamoxifen and has stopped taking it Saturday and Sunday and states is not going to take it "for a while, till this heals up down there."  Reviewed message with Dr Welton Flakes, pt to continue with tamoxifen and patient to take a laxative/milk of mag/miralax to assist with passing of stool and patient needs to f/u with her PCP. Patient states she is very hesitant to use laxatives and will add "natural fiber" to her daily regimend. Patient encouraged to call office and update and with any questions or concerns.

## 2012-07-07 ENCOUNTER — Ambulatory Visit: Payer: 59 | Attending: Oncology | Admitting: Physical Therapy

## 2012-07-07 DIAGNOSIS — I890 Lymphedema, not elsewhere classified: Secondary | ICD-10-CM | POA: Insufficient documentation

## 2012-07-07 DIAGNOSIS — IMO0001 Reserved for inherently not codable concepts without codable children: Secondary | ICD-10-CM | POA: Insufficient documentation

## 2012-07-21 ENCOUNTER — Ambulatory Visit: Payer: 59 | Admitting: Physical Therapy

## 2012-08-04 ENCOUNTER — Ambulatory Visit: Payer: 59 | Attending: Oncology | Admitting: Physical Therapy

## 2012-08-04 DIAGNOSIS — IMO0001 Reserved for inherently not codable concepts without codable children: Secondary | ICD-10-CM | POA: Insufficient documentation

## 2012-08-04 DIAGNOSIS — I890 Lymphedema, not elsewhere classified: Secondary | ICD-10-CM | POA: Insufficient documentation

## 2012-08-17 ENCOUNTER — Ambulatory Visit: Payer: 59 | Admitting: Physical Therapy

## 2012-09-01 ENCOUNTER — Ambulatory Visit: Payer: 59 | Attending: Oncology | Admitting: Physical Therapy

## 2012-09-01 DIAGNOSIS — I890 Lymphedema, not elsewhere classified: Secondary | ICD-10-CM | POA: Insufficient documentation

## 2012-09-01 DIAGNOSIS — IMO0001 Reserved for inherently not codable concepts without codable children: Secondary | ICD-10-CM | POA: Insufficient documentation

## 2012-09-20 ENCOUNTER — Telehealth: Payer: Self-pay | Admitting: Medical Oncology

## 2012-09-20 ENCOUNTER — Ambulatory Visit: Payer: 59 | Admitting: Physical Therapy

## 2012-09-20 NOTE — Telephone Encounter (Signed)
Patient LVMOM asking whether tamoxifen causes hair thinning and bruising?   Patient also stated she is taking estroven with black cohosh for lack of sleep and wanting to know if this is ok?  Patient also states has mammogram sched at the end of September and appt with Dr Welton Flakes at the end of August, wanting to see about switching appt.  Reviewed with MD, attempted to call patient, no answer. LVMOM for patient to return call to office.

## 2012-09-21 NOTE — Telephone Encounter (Signed)
F/U with pt regarding her concerns. Informed pt that hair thinning is a s/e of the tamoxifen. Informed pt that Dr Milta Deiters does not recommend the estroven with black cohosh and encourages pt to take melatonin. Pt states she has been taking 3 mg of melatonin at night but states she will stop the black cohosh and will try increasing the melatonin to 6 mg (she has 3 mg tabs).  Patient's expresses frustration d/t to lack of sleep from having hot flashes from the tamoxifen. Pt states the hot flashes wake her up and then nausea starts and then has difficulty falling back asleep. States she was taking the black cohosh to help with the hot flashes so she can sleep.  She expresses concern regarding having 6 bruises to BLE as well as having leg cramps 2-3 times per week, denies taking aspirin, denies pain to BLE other than the continuing neuropathy she has.   States she will try the melatonin at 6 mg to see if that will help with sleep.  Also would like to change MD appt to October, after her mammogram in September. Patient knows to call office with any questions or concerns.  Mssg forwarded to MD for further review.  Onc tx sent.

## 2012-09-22 ENCOUNTER — Telehealth: Payer: Self-pay | Admitting: Oncology

## 2012-09-22 NOTE — Telephone Encounter (Signed)
Has she tried anything else for the hot flashes?  L

## 2012-10-08 ENCOUNTER — Ambulatory Visit: Payer: 59 | Attending: Oncology | Admitting: Physical Therapy

## 2012-10-08 DIAGNOSIS — IMO0001 Reserved for inherently not codable concepts without codable children: Secondary | ICD-10-CM | POA: Insufficient documentation

## 2012-10-08 DIAGNOSIS — I890 Lymphedema, not elsewhere classified: Secondary | ICD-10-CM | POA: Insufficient documentation

## 2012-10-19 ENCOUNTER — Encounter: Payer: 59 | Admitting: Physical Therapy

## 2012-10-28 ENCOUNTER — Encounter: Payer: Self-pay | Admitting: Oncology

## 2012-10-28 ENCOUNTER — Other Ambulatory Visit: Payer: Self-pay | Admitting: Oncology

## 2012-10-28 DIAGNOSIS — C50911 Malignant neoplasm of unspecified site of right female breast: Secondary | ICD-10-CM

## 2012-10-29 ENCOUNTER — Telehealth: Payer: Self-pay | Admitting: Oncology

## 2012-11-03 ENCOUNTER — Ambulatory Visit: Payer: 59 | Attending: Oncology | Admitting: Physical Therapy

## 2012-11-03 ENCOUNTER — Encounter: Payer: Self-pay | Admitting: *Deleted

## 2012-11-03 DIAGNOSIS — I890 Lymphedema, not elsewhere classified: Secondary | ICD-10-CM | POA: Insufficient documentation

## 2012-11-03 DIAGNOSIS — IMO0001 Reserved for inherently not codable concepts without codable children: Secondary | ICD-10-CM | POA: Insufficient documentation

## 2012-11-03 NOTE — Progress Notes (Signed)
Clinical Social Work received referral from [employer].  CSW called patient by phone and patient shares feeling overwhelmed by post-cancer treatment side effects as well as difficulty family dynamics.  Patient requested referral to counselor.  CSW provided patient with information on [place] [facility] and [facility] counseling.  CSW briefly discussed [facility] program, however, patient states she has already completed similar class at [facility].  Patient had no other questions or concerns.  Kathrin Penner, MSW, LCSW Clinical Social Worker [facility] [phone]

## 2012-11-09 ENCOUNTER — Ambulatory Visit (INDEPENDENT_AMBULATORY_CARE_PROVIDER_SITE_OTHER): Payer: 59 | Admitting: Psychiatry

## 2012-11-09 ENCOUNTER — Encounter: Payer: Self-pay | Admitting: Oncology

## 2012-11-09 DIAGNOSIS — F0630 Mood disorder due to known physiological condition, unspecified: Secondary | ICD-10-CM

## 2012-11-12 ENCOUNTER — Other Ambulatory Visit: Payer: Self-pay | Admitting: *Deleted

## 2012-11-12 ENCOUNTER — Telehealth: Payer: Self-pay | Admitting: *Deleted

## 2012-11-12 MED ORDER — VENLAFAXINE HCL 37.5 MG PO TABS: 37.5000 mg | ORAL_TABLET | Freq: Every day | ORAL | Status: DC

## 2012-11-12 NOTE — Telephone Encounter (Signed)
Pt called states " i saw Dr. Noe Gens last week and she recommended I take  Lexapro or Effexor. She said she would send Dr. Welton Flakes a message as well. I called teh pharmacy but they havent received anything. Would Dr. Welton Flakes be able to give me a Rx for either one of these. I dont want to be on it forever." Review with MD ok to rx Effexor 37.5mg   Pt to take 1 pill daily Q#30 refills 1

## 2012-11-16 ENCOUNTER — Ambulatory Visit: Payer: 59 | Admitting: Physical Therapy

## 2012-11-17 ENCOUNTER — Encounter: Payer: 59 | Admitting: Physical Therapy

## 2012-11-18 ENCOUNTER — Ambulatory Visit (INDEPENDENT_AMBULATORY_CARE_PROVIDER_SITE_OTHER): Payer: 59 | Admitting: Psychiatry

## 2012-11-18 DIAGNOSIS — F0630 Mood disorder due to known physiological condition, unspecified: Secondary | ICD-10-CM

## 2012-11-25 ENCOUNTER — Other Ambulatory Visit: Payer: 59 | Admitting: Lab

## 2012-11-25 ENCOUNTER — Ambulatory Visit: Payer: 59 | Admitting: Oncology

## 2012-12-01 ENCOUNTER — Telehealth: Payer: Self-pay | Admitting: Medical Oncology

## 2012-12-01 ENCOUNTER — Ambulatory Visit: Payer: 59 | Attending: Oncology | Admitting: Physical Therapy

## 2012-12-01 DIAGNOSIS — I890 Lymphedema, not elsewhere classified: Secondary | ICD-10-CM | POA: Insufficient documentation

## 2012-12-01 DIAGNOSIS — IMO0001 Reserved for inherently not codable concepts without codable children: Secondary | ICD-10-CM | POA: Insufficient documentation

## 2012-12-01 NOTE — Telephone Encounter (Signed)
Patient requesting to have thyroid and adrenal test done with next lab appt. Patient states she is trying to lose the weight gain she has gotten since starting tamoxifen and is doing it through eating a healthy diet as well as working with a Systems analyst, but would also like to make sure that these two tests are normal.  Mssg forwarded to MD for review.  LOV 04/07/12 with MD Next sched appt 10/03 lab/MD

## 2012-12-14 ENCOUNTER — Ambulatory Visit (INDEPENDENT_AMBULATORY_CARE_PROVIDER_SITE_OTHER): Payer: 59 | Admitting: Psychiatry

## 2012-12-14 DIAGNOSIS — F0630 Mood disorder due to known physiological condition, unspecified: Secondary | ICD-10-CM

## 2012-12-15 ENCOUNTER — Ambulatory Visit: Payer: 59 | Admitting: Physical Therapy

## 2012-12-17 ENCOUNTER — Ambulatory Visit (HOSPITAL_BASED_OUTPATIENT_CLINIC_OR_DEPARTMENT_OTHER): Payer: 59 | Admitting: Adult Health

## 2012-12-17 ENCOUNTER — Encounter: Payer: Self-pay | Admitting: Adult Health

## 2012-12-17 ENCOUNTER — Telehealth: Payer: Self-pay | Admitting: Oncology

## 2012-12-17 VITALS — BP 143/91 | HR 94 | Temp 97.8°F | Resp 18 | Ht 63.0 in | Wt 183.7 lb

## 2012-12-17 DIAGNOSIS — R911 Solitary pulmonary nodule: Secondary | ICD-10-CM

## 2012-12-17 DIAGNOSIS — C50919 Malignant neoplasm of unspecified site of unspecified female breast: Secondary | ICD-10-CM

## 2012-12-17 DIAGNOSIS — C50911 Malignant neoplasm of unspecified site of right female breast: Secondary | ICD-10-CM

## 2012-12-17 DIAGNOSIS — N644 Mastodynia: Secondary | ICD-10-CM

## 2012-12-17 NOTE — Progress Notes (Addendum)
[name] [name] [id number] 14-Sep-1960 52 y.o. 12/18/2012 8:51 AM  CC  Pcp Not In System No address on file  DIAGNOSIS: 52 year old female with invasive ductal carcinoma of the right breast originally diagnosed in 2005. She subsequently had a local regional recurrence to the right axilla for which she was treated at [facility].  Past Medical History:  #1 breast cancer as above  #2 isolated seizure April 2010 negative for workup with no recurrent episodes.  #3 elevated liver enzymes.  #4 DVT of the right IJ  #5 status post radiotherapy per history of present illness.  #6 no history of autoimmune illnesses collagen vascular disorders cardiac pacemakers or implanted defibrillator's.  Past Surgical History: #1 status post lumpectomy with sentinel node biopsy 2005.  #2 status post axillary lymph node dissection of the right axilla in December 2011.  INTERVAL HISTORY: [name] [name] returns for followup visit today. She is undergoing physical therapy which has been ongoing for her lymphedema.  The lymphedema fluctuates with heat and humidity.  She was receiving treatment once a week, and when they attempted to decrease the frequency.  She went to the beach 10/01/12, noticed she needed to do flexitouch and in the shower she developed a sharp stabbing pain in the right axillary area.  This has also happened once more.  This past Tuesday night she did her flexi touch and woke up in the middle of the night to sharp pain in the axillary area.  She had this pain previously during biopsy by Dr. Noe Gens.  She is having a mammogram next week and really would like an ultrasound as well.  It remains tender and swollen.  She is very concerned.  She is taking Tamoxifen daily and does endorse hot flashes, dryness, lack of sleep, nuasea, joint aches.  She denies fevers, chills, pain, unintentional weight loss, or any further concerns.    Past Surgical History  Procedure Laterality Date  . Breast lumpectomy  2005  .  Axillary lymph node dissection  Dec. 2011    Allergies: Allergies  Allergen Reactions  . Morphine And Related   . Tegaderm Ag Mesh [Silver]     Current Medications: Current Outpatient Prescriptions  Medication Sig Dispense Refill  . ALPRAZolam (XANAX) 0.5 MG tablet Take 1 tablet (0.5 mg total) by mouth at bedtime as needed for sleep.  30 tablet  5  . b complex vitamins capsule Take 1 capsule by mouth daily.      . Calcium-Magnesium-Vitamin D (CITRACAL CALCIUM+D PO) Take by mouth. 400mg  vitamin C, 500mg  vitamin D3 2 PO DAILY      . folic acid (FOLVITE) 1 MG tablet Take 1 mg by mouth daily.      Marland Kitchen guaiFENesin (MUCINEX) 600 MG 12 hr tablet Take 1,200 mg by mouth 2 (two) times daily.      . Melatonin 1 MG TABS Take 1 mg by mouth at bedtime as needed.      . tamoxifen (NOLVADEX) 20 MG tablet Take 1 tablet (20 mg total) by mouth daily.  30 tablet  12  . venlafaxine (EFFEXOR) 37.5 MG tablet Take 1 tablet (37.5 mg total) by mouth daily.  30 tablet  1   No current facility-administered medications for this visit.   Health Maintenance Mammogram: 11/2011 Colonoscopy: never Bone Density Scan: 2012 Pap Smear: not recent Eye Exam: 12/2011 Vitamin D Level: n/a Lipid Panel:  2011  REVIEW OF SYSTEMS: A 10 point review of systems was conducted and is otherwise negative except for what is  noted above.     PHYSICAL EXAMINATION: Blood pressure 143/91, pulse 94, temperature 97.8 F (36.6 C), temperature source Oral, resp. rate 18, height 5\' 3"  (1.6 m), weight 183 lb 11.2 oz (83.326 kg). General: Patient is a well appearing female in no acute distress HEENT: PERRLA, sclerae anicteric no conjunctival pallor, MMM Neck: supple, no palpable adenopathy Lungs: clear to auscultation bilaterally, no wheezes, rhonchi, or rales Cardiovascular: regular rate rhythm, S1, S2, no murmurs, rubs or gallops Abdomen: Soft, non-tender, non-distended, normoactive bowel sounds, no HSM Extremities: warm and well  perfused, no clubbing, cyanosis, or edema Skin: No rashes or lesions Neuro: Non-focal ECOG-1 Right breast: mastectomy site well healed, axillary scar healed, no nodularity, tender and slightly swollen, left breast, no masses or nodularity  LABS:    Chemistry      Component Value Date/Time   NA 140 05/06/2012 1333   NA 139 05/22/2011 1232   K 4.3 05/06/2012 1333   K 4.0 05/22/2011 1232   CL 105 05/06/2012 1333   CL 101 05/22/2011 1232   CO2 23 05/06/2012 1333   CO2 24 05/22/2011 1232   BUN 18.2 05/06/2012 1333   BUN 15 05/22/2011 1232   CREATININE 0.8 05/06/2012 1333   CREATININE 0.93 05/22/2011 1232      Component Value Date/Time   CALCIUM 9.6 05/06/2012 1333   CALCIUM 9.9 05/22/2011 1232   ALKPHOS 59 05/06/2012 1333   ALKPHOS 50 05/22/2011 1232   AST 24 05/06/2012 1333   AST 25 05/22/2011 1232   ALT 40 05/06/2012 1333   ALT 29 05/22/2011 1232   BILITOT 0.38 05/06/2012 1333   BILITOT 0.3 05/22/2011 1232      Lab Results  Component Value Date   WBC 7.2 05/06/2012   HGB 13.3 05/06/2012   HCT 38.8 05/06/2012   MCV 94.6 05/06/2012   PLT 257 05/06/2012    ASSESSMENT    52 year old female with   1. previous history of 1.7 cm ER positive breast cancer originally diagnosed in 2005. At that time patient underwent a lumpectomy with sentinel node biopsy followed by adjuvant chemotherapy consisting of 4 cycles of a.c. Thereafter she was offered endocrine therapy but she declined. She however did undergo radiation therapy. In 2011 patient had a local regional axillary recurrence that was diagnosed at [facility]. The core needle biopsy revealed a high-grade invasive ductal carcinoma that was ER positive HER-2/neu negative. Patient underwent neoadjuvant chemotherapy consisting of 3 cycles of Taxol and Cytoxan but discontinued about due to toxicity and intolerance to Taxol. She then went on to have right axillary lymph node dissection the final pathology revealed 3 of 8 lymph nodes positive for invasive cancer. She  received local regional radiation therapy to the excellent as well as the supraclavicular region. Thereafter she was recommended antiestrogen therapy with aromatase inhibitors. She was started on letrozole but could not tolerate it therefore after she was transitioned to Aromasin she could not tolerate that well either. And in September 2012 she was transitioned to tamoxifen which she has been using. She continues to complain of significant fatigue she is constipated from the tamoxifen and she is tired. She also complains of having peripheral neuropathies. She also has noticed anxiety disturbances. She also has noticed some lymphedema in the right upper axillary region. She is working with Lupita Leash in the outpatient rehabilitation setting for possible physical therapy and lymphedema therapy.  #2Patient has a lung nodule that we are monitoring with serial CT's.  Last CT was in 05/2012  and it showed a 4mm nodule.  She will repeat another CT this month.       PLAN:   #1 Doing well, no sign of recurrence, patient will continue Tamoxifen daily.  #2 Proceed with right breast mammogram and ultrasound on 12/24/12.   #3 Continue with lymphedema physical therapy.    #4 Patient will have repeat CT chest this month.    #5 Return to clinic in 6 months.    All questions were answered. The patient knows to call the clinic with any problems, questions or concerns. We can certainly see the patient much sooner if necessary.  I spent 25 minutes counseling the patient face to face. The total time spent in the appointment was 30 minutes.  Cherie Ouch Lyn Hollingshead, NP Medical Oncology [facility] Phone: [phone]   ATTENDING'S ATTESTATION:  I personally reviewed patient's chart, examined patient myself, formulated the treatment plan as followed.    Patient with chronic lymphedema of the breast and arm and chronic pain. She is asking for a ultrasound and mammogram. We will go and schedule this for  her. In the meantime she will continue to take her tamoxifen. She is trying to exercise and eat a healthy diet. I do think overall she looks terrific. Patient does need staging CT scan  Drue Second, MD Medical/Oncology [facility] [phone] (beeper) [phone] (Office)  12/19/2012, 11:42 PM

## 2012-12-17 NOTE — Patient Instructions (Addendum)
Doing well.  Continue daily Tamoxifen.  We will get ultrasound with mammogram on Friday as well as a CT chest to f/u on the previous lung nodule.  Please call us if you have any questions or concerns.     Return in 6 months.

## 2012-12-20 ENCOUNTER — Encounter (HOSPITAL_COMMUNITY): Payer: Self-pay

## 2012-12-20 ENCOUNTER — Ambulatory Visit (HOSPITAL_COMMUNITY)
Admission: RE | Admit: 2012-12-20 | Discharge: 2012-12-20 | Disposition: A | Discharge status: Home / Self Care | Payer: 59 | Source: Ambulatory Visit | Attending: Adult Health | Admitting: Adult Health

## 2012-12-20 DIAGNOSIS — Z923 Personal history of irradiation: Secondary | ICD-10-CM | POA: Insufficient documentation

## 2012-12-20 DIAGNOSIS — R599 Enlarged lymph nodes, unspecified: Secondary | ICD-10-CM | POA: Insufficient documentation

## 2012-12-20 DIAGNOSIS — R091 Pleurisy: Secondary | ICD-10-CM | POA: Insufficient documentation

## 2012-12-20 DIAGNOSIS — R911 Solitary pulmonary nodule: Secondary | ICD-10-CM

## 2012-12-20 DIAGNOSIS — R918 Other nonspecific abnormal finding of lung field: Secondary | ICD-10-CM | POA: Insufficient documentation

## 2012-12-20 DIAGNOSIS — C50911 Malignant neoplasm of unspecified site of right female breast: Secondary | ICD-10-CM

## 2012-12-20 DIAGNOSIS — R079 Chest pain, unspecified: Secondary | ICD-10-CM | POA: Insufficient documentation

## 2012-12-20 DIAGNOSIS — Z9221 Personal history of antineoplastic chemotherapy: Secondary | ICD-10-CM | POA: Insufficient documentation

## 2012-12-20 DIAGNOSIS — N644 Mastodynia: Secondary | ICD-10-CM

## 2012-12-20 DIAGNOSIS — K7689 Other specified diseases of liver: Secondary | ICD-10-CM | POA: Insufficient documentation

## 2012-12-20 DIAGNOSIS — Z853 Personal history of malignant neoplasm of breast: Secondary | ICD-10-CM | POA: Insufficient documentation

## 2012-12-20 MED ORDER — IOHEXOL 300 MG/ML  SOLN
80.0000 mL | Freq: Once | INTRAMUSCULAR | Status: AC | PRN
  Administered 2012-12-20: 80 mL via INTRAVENOUS

## 2012-12-21 ENCOUNTER — Ambulatory Visit: Payer: 59 | Admitting: Physical Therapy

## 2012-12-22 ENCOUNTER — Encounter: Payer: Self-pay | Admitting: Oncology

## 2012-12-24 ENCOUNTER — Ambulatory Visit
Admission: RE | Admit: 2012-12-24 | Discharge: 2012-12-24 | Disposition: A | Discharge status: Home / Self Care | Payer: 59 | Source: Ambulatory Visit | Attending: Oncology | Admitting: Oncology

## 2012-12-24 ENCOUNTER — Other Ambulatory Visit: Payer: Self-pay

## 2012-12-24 DIAGNOSIS — R911 Solitary pulmonary nodule: Secondary | ICD-10-CM

## 2012-12-24 DIAGNOSIS — C50911 Malignant neoplasm of unspecified site of right female breast: Secondary | ICD-10-CM

## 2012-12-24 DIAGNOSIS — N644 Mastodynia: Secondary | ICD-10-CM

## 2012-12-27 ENCOUNTER — Other Ambulatory Visit: Payer: Self-pay | Admitting: Oncology

## 2012-12-27 ENCOUNTER — Ambulatory Visit: Payer: 59 | Admitting: Physical Therapy

## 2012-12-27 DIAGNOSIS — C50911 Malignant neoplasm of unspecified site of right female breast: Secondary | ICD-10-CM

## 2012-12-28 ENCOUNTER — Encounter: Payer: Self-pay | Admitting: Oncology

## 2012-12-28 ENCOUNTER — Telehealth: Payer: Self-pay | Admitting: Oncology

## 2012-12-28 NOTE — Telephone Encounter (Signed)
, °

## 2012-12-31 ENCOUNTER — Ambulatory Visit: Payer: 59 | Admitting: Oncology

## 2012-12-31 ENCOUNTER — Other Ambulatory Visit: Payer: 59 | Admitting: Lab

## 2013-01-03 ENCOUNTER — Ambulatory Visit: Payer: 59 | Attending: Oncology | Admitting: Physical Therapy

## 2013-01-03 DIAGNOSIS — I890 Lymphedema, not elsewhere classified: Secondary | ICD-10-CM | POA: Insufficient documentation

## 2013-01-03 DIAGNOSIS — IMO0001 Reserved for inherently not codable concepts without codable children: Secondary | ICD-10-CM | POA: Insufficient documentation

## 2013-01-10 ENCOUNTER — Encounter (HOSPITAL_COMMUNITY)
Admission: RE | Admit: 2013-01-10 | Discharge: 2013-01-10 | Disposition: A | Discharge status: Home / Self Care | Payer: 59 | Source: Ambulatory Visit | Attending: Oncology | Admitting: Oncology

## 2013-01-10 ENCOUNTER — Other Ambulatory Visit (HOSPITAL_BASED_OUTPATIENT_CLINIC_OR_DEPARTMENT_OTHER): Payer: 59 | Admitting: Lab

## 2013-01-10 ENCOUNTER — Ambulatory Visit: Payer: Self-pay | Admitting: Lab

## 2013-01-10 ENCOUNTER — Ambulatory Visit: Payer: Self-pay

## 2013-01-10 DIAGNOSIS — R091 Pleurisy: Secondary | ICD-10-CM | POA: Insufficient documentation

## 2013-01-10 DIAGNOSIS — C773 Secondary and unspecified malignant neoplasm of axilla and upper limb lymph nodes: Secondary | ICD-10-CM

## 2013-01-10 DIAGNOSIS — C50919 Malignant neoplasm of unspecified site of unspecified female breast: Secondary | ICD-10-CM

## 2013-01-10 DIAGNOSIS — C50419 Malignant neoplasm of upper-outer quadrant of unspecified female breast: Secondary | ICD-10-CM

## 2013-01-10 DIAGNOSIS — R599 Enlarged lymph nodes, unspecified: Secondary | ICD-10-CM | POA: Insufficient documentation

## 2013-01-10 DIAGNOSIS — K7689 Other specified diseases of liver: Secondary | ICD-10-CM | POA: Insufficient documentation

## 2013-01-10 LAB — CBC WITH DIFFERENTIAL/PLATELET
Basophils Absolute: 0 10*3/uL (ref 0.0–0.1)
EOS%: 2 % (ref 0.0–7.0)
Eosinophils Absolute: 0.1 10*3/uL (ref 0.0–0.5)
HGB: 13.4 g/dL (ref 11.6–15.9)
NEUT#: 4.2 10*3/uL (ref 1.5–6.5)
RBC: 4.14 10*6/uL (ref 3.70–5.45)
RDW: 12.4 % (ref 11.2–14.5)
lymph#: 1.6 10*3/uL (ref 0.9–3.3)
nRBC: 0 % (ref 0–0)

## 2013-01-10 LAB — COMPREHENSIVE METABOLIC PANEL (CC13)
ALT: 65 U/L — ABNORMAL HIGH (ref 0–55)
AST: 37 U/L — ABNORMAL HIGH (ref 5–34)
Anion Gap: 11 mEq/L (ref 3–11)
CO2: 24 mEq/L (ref 22–29)
Chloride: 103 mEq/L (ref 98–109)
Sodium: 139 mEq/L (ref 136–145)
Total Bilirubin: 0.45 mg/dL (ref 0.20–1.20)
Total Protein: 7.5 g/dL (ref 6.4–8.3)

## 2013-01-10 LAB — GLUCOSE, CAPILLARY: Glucose-Capillary: 97 mg/dL (ref 70–99)

## 2013-01-10 MED ORDER — FLUDEOXYGLUCOSE F - 18 (FDG) INJECTION
19.1000 | Freq: Once | INTRAVENOUS | Status: AC | PRN
  Administered 2013-01-10: 19.1 via INTRAVENOUS

## 2013-01-12 ENCOUNTER — Ambulatory Visit: Payer: 59 | Admitting: Physical Therapy

## 2013-01-14 ENCOUNTER — Telehealth: Payer: Self-pay | Admitting: *Deleted

## 2013-01-14 ENCOUNTER — Encounter: Payer: Self-pay | Admitting: Adult Health

## 2013-01-14 ENCOUNTER — Ambulatory Visit (HOSPITAL_BASED_OUTPATIENT_CLINIC_OR_DEPARTMENT_OTHER): Payer: 59 | Admitting: Oncology

## 2013-01-14 VITALS — BP 152/100 | HR 103 | Temp 97.9°F | Resp 20 | Ht 63.0 in | Wt 182.3 lb

## 2013-01-14 DIAGNOSIS — C50419 Malignant neoplasm of upper-outer quadrant of unspecified female breast: Secondary | ICD-10-CM

## 2013-01-14 DIAGNOSIS — Z170 Estrogen receptor positive status [ER+]: Secondary | ICD-10-CM

## 2013-01-14 DIAGNOSIS — F411 Generalized anxiety disorder: Secondary | ICD-10-CM

## 2013-01-14 DIAGNOSIS — K7689 Other specified diseases of liver: Secondary | ICD-10-CM

## 2013-01-14 DIAGNOSIS — C50911 Malignant neoplasm of unspecified site of right female breast: Secondary | ICD-10-CM

## 2013-01-14 DIAGNOSIS — R5381 Other malaise: Secondary | ICD-10-CM

## 2013-01-14 DIAGNOSIS — I890 Lymphedema, not elsewhere classified: Secondary | ICD-10-CM

## 2013-01-14 NOTE — Telephone Encounter (Signed)
Per NP, ok to get flu shot, POF to scheduler for PAC flush to be on 02/17 as pt will need this accessed prior to CT scan

## 2013-01-14 NOTE — Telephone Encounter (Signed)
appts made and printed. Pt is aware that cs will call with an appt for her CT's...td

## 2013-01-16 ENCOUNTER — Encounter: Payer: Self-pay | Admitting: Adult Health

## 2013-01-18 ENCOUNTER — Other Ambulatory Visit: Payer: Self-pay | Admitting: *Deleted

## 2013-01-18 DIAGNOSIS — C50919 Malignant neoplasm of unspecified site of unspecified female breast: Secondary | ICD-10-CM

## 2013-01-18 MED ORDER — ALPRAZOLAM 0.5 MG PO TABS: 0.5000 mg | ORAL_TABLET | Freq: Every evening | ORAL | Status: DC | PRN

## 2013-01-19 ENCOUNTER — Ambulatory Visit: Payer: 59 | Admitting: Physical Therapy

## 2013-01-20 ENCOUNTER — Other Ambulatory Visit: Payer: Self-pay | Admitting: *Deleted

## 2013-01-20 DIAGNOSIS — C50919 Malignant neoplasm of unspecified site of unspecified female breast: Secondary | ICD-10-CM

## 2013-01-20 MED ORDER — TAMOXIFEN CITRATE 20 MG PO TABS: 20.0000 mg | ORAL_TABLET | Freq: Every day | ORAL | Status: DC

## 2013-01-23 NOTE — Progress Notes (Signed)
[name] [name] [id number] [birth year] 52 y.o. 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 She subsequently had a local regional recurrence to the right axilla for which she was treated at [facility].  Past Medical History:  #1 breast cancer as above  #2 isolated seizure April 2010 negative for workup with no recurrent episodes.  #3 elevated liver enzymes.  #4 DVT of the right IJ  #5 status post radiotherapy per history of present illness.  #6 no history of autoimmune illnesses collagen vascular disorders cardiac pacemakers or implanted defibrillator's.  Past Surgical History: #1 status post lumpectomy with sentinel node biopsy 2005.  #2 status post axillary lymph node dissection of the right axilla in December 2011.  INTERVAL HISTORY:  patient is seen in followup today to discuss her PET scan results. We went over these in detail. I also showed them the pictures. Report was given to her. She does have some what looks like possibly progressive disease in the lymph nodes. However I am uncertain whether this requires change in therapy. She has been taking tamoxifen now more religiously then she has in the past. She does develop some side effects of it but she is able to tolerate this.  Past Surgical History  Procedure Laterality Date  . Breast lumpectomy  2005  . Axillary lymph node dissection  Dec. 2011    Allergies: Allergies  Allergen Reactions  . Morphine And Related   . Tegaderm Ag Mesh [Silver]     Current Medications: Current Outpatient Prescriptions  Medication Sig Dispense Refill  . b complex vitamins capsule Take 1 capsule by mouth daily.      . folic acid (FOLVITE) 1 MG tablet Take 1 mg by mouth daily.      . Magnesium 100 MG CAPS Take 1 each by mouth. Taking 125mg       . Melatonin 1 MG TABS Take 1 mg by mouth at bedtime as  needed.      . venlafaxine (EFFEXOR) 37.5 MG tablet Take 1 tablet (37.5 mg total) by mouth daily.  30 tablet  1  . VITAMIN D, CHOLECALCIFEROL, PO Take 1,000 Units by mouth.      . ALPRAZolam (XANAX) 0.5 MG tablet Take 1 tablet (0.5 mg total) by mouth at bedtime as needed for sleep.  30 tablet  1  . Calcium-Magnesium-Vitamin D (CITRACAL CALCIUM+D PO) Take by mouth. 400mg  vitamin C, 500mg  vitamin D3 2 PO DAILY      . guaiFENesin (MUCINEX) 600 MG 12 hr tablet Take 1,200 mg by mouth 2 (two) times daily.      . tamoxifen (NOLVADEX) 20 MG tablet Take 1 tablet (20 mg total) by mouth daily.  30 tablet  4   No current facility-administered medications for this visit.   Health Maintenance Mammogram: 11/2011 Colonoscopy: never Bone Density Scan: 2012 Pap Smear: not recent Eye Exam: 12/2011 Vitamin D Level: n/a Lipid Panel:  2011  REVIEW OF SYSTEMS: A 10 point review of systems was conducted and is otherwise negative except for what is noted above.     PHYSICAL EXAMINATION: Blood pressure 152/100, pulse 103, temperature 97.9 F (36.6 C), temperature source Oral, resp. rate 20, height 5\' 3"  (1.6 m), weight 182 lb 4.8 oz (82.691 kg). General: Patient is a well appearing female in no acute distress HEENT: PERRLA, sclerae anicteric no conjunctival pallor, MMM Neck: supple, no palpable  adenopathy Lungs: clear to auscultation bilaterally, no wheezes, rhonchi, or rales Cardiovascular: regular rate rhythm, S1, S2, no murmurs, rubs or gallops Abdomen: Soft, non-tender, non-distended, normoactive bowel sounds, no HSM Extremities: warm and well perfused, no clubbing, cyanosis, or edema Skin: No rashes or lesions Neuro: Non-focal ECOG-1 Right breast: mastectomy site well healed, axillary scar healed, no nodularity, tender and slightly swollen, left breast, no masses or nodularity  LABS:    Chemistry      Component Value Date/Time   NA 139 01/10/2013 1438   NA 139 05/22/2011 1232   K 3.8  01/10/2013 1438   K 4.0 05/22/2011 1232   CL 105 05/06/2012 1333   CL 101 05/22/2011 1232   CO2 24 01/10/2013 1438   CO2 24 05/22/2011 1232   BUN 9.6 01/10/2013 1438   BUN 15 05/22/2011 1232   CREATININE 0.8 01/10/2013 1438   CREATININE 0.93 05/22/2011 1232      Component Value Date/Time   CALCIUM 9.9 01/10/2013 1438   CALCIUM 9.9 05/22/2011 1232   ALKPHOS 47 01/10/2013 1438   ALKPHOS 50 05/22/2011 1232   AST 37* 01/10/2013 1438   AST 25 05/22/2011 1232   ALT 65* 01/10/2013 1438   ALT 29 05/22/2011 1232   BILITOT 0.45 01/10/2013 1438   BILITOT 0.3 05/22/2011 1232      Lab Results  Component Value Date   WBC 6.5 01/10/2013   HGB 13.4 01/10/2013   HCT 39.0 01/10/2013   MCV 94.2 01/10/2013   PLT 283 01/10/2013    ASSESSMENT    52 year old female with   1. previous history of 1.7 cm ER positive breast cancer originally diagnosed in 2005. At that time patient underwent a lumpectomy with sentinel node biopsy followed by adjuvant chemotherapy consisting of 4 cycles of a.c. Thereafter she was offered endocrine therapy but she declined. She however did undergo radiation therapy. In 2011 patient had a local regional axillary recurrence that was diagnosed at [facility]. The core needle biopsy revealed a high-grade invasive ductal carcinoma that was ER positive HER-2/neu negative. Patient underwent neoadjuvant chemotherapy consisting of 3 cycles of Taxol and Cytoxan but discontinued about due to toxicity and intolerance to Taxol. She then went on to have right axillary lymph node dissection the final pathology revealed 3 of 8 lymph nodes positive for invasive cancer. She received local regional radiation therapy to the excellent as well as the supraclavicular region. Thereafter she was recommended antiestrogen therapy with aromatase inhibitors. She was started on letrozole but could not tolerate it therefore after she was transitioned to Aromasin she could not tolerate that well either. And in  September 2012 she was transitioned to tamoxifen which she has been using. She continues to complain of significant fatigue she is constipated from the tamoxifen and she is tired. She also complains of having peripheral neuropathies. She also has noticed anxiety disturbances. She also has noticed some lymphedema in the right upper axillary region. She is working with Lupita Leash in the outpatient rehabilitation setting for possible physical therapy and lymphedema therapy.  #2PET CT were performed and the report is as below. CT scanand 12/20/2012   1. Findings consistent with recurrent/metastatic disease within the left pleural space and anterior mediastinum.  2. Cannot exclude developing upper abdominal adenopathy,  incompletely imaged.  3. Hepatic steatosis. PET scan on 01/10/2013 IMPRESSION:  1. Hypermetabolic high left internal mammary, mediastinal and  retroperitoneal adenopathy, most consistent with metastatic disease.  2. Hypermetabolic pleural thickening in the posteromedial aspect of  the lower left hemi thorax, also most consistent metastatic disease.  3. Hepatic steatosis.   PLAN:   #1 patient and I discussed her results today. Plan at this time is to continue the tamoxifen 20 mg daily.  #2 I will plan on getting another set of scans on her in about 3 months time and I will see her in followup. Of course he knows to call me if something should happen sooner.  #3 patient will continue pain medications as well as doing exercises for her lymphedema.    All questions were answered. The patient knows to call the clinic with any problems, questions or concerns. We can certainly see the patient much sooner if necessary.  I spent 30 minutes counseling the patient face to face. The total time spent in the appointment was 30 minutes.   Drue Second, MD Medical/Oncology [facility] [phone] (beeper) [phone] (Office)  01/23/2013, 2:19 PM

## 2013-01-26 ENCOUNTER — Ambulatory Visit: Payer: 59 | Admitting: Physical Therapy

## 2013-02-03 ENCOUNTER — Other Ambulatory Visit: Payer: Self-pay

## 2013-02-09 ENCOUNTER — Ambulatory Visit: Payer: 59 | Attending: Oncology | Admitting: Physical Therapy

## 2013-02-09 DIAGNOSIS — I890 Lymphedema, not elsewhere classified: Secondary | ICD-10-CM | POA: Insufficient documentation

## 2013-02-09 DIAGNOSIS — IMO0001 Reserved for inherently not codable concepts without codable children: Secondary | ICD-10-CM | POA: Insufficient documentation

## 2013-02-15 ENCOUNTER — Ambulatory Visit: Payer: 59 | Admitting: Physical Therapy

## 2013-02-16 ENCOUNTER — Other Ambulatory Visit: Payer: Self-pay | Admitting: *Deleted

## 2013-02-16 ENCOUNTER — Encounter: Payer: Self-pay | Admitting: Adult Health

## 2013-02-23 ENCOUNTER — Ambulatory Visit: Payer: 59 | Admitting: Physical Therapy

## 2013-03-02 ENCOUNTER — Ambulatory Visit: Payer: 59 | Attending: Oncology | Admitting: Physical Therapy

## 2013-03-02 DIAGNOSIS — IMO0001 Reserved for inherently not codable concepts without codable children: Secondary | ICD-10-CM | POA: Insufficient documentation

## 2013-03-02 DIAGNOSIS — I890 Lymphedema, not elsewhere classified: Secondary | ICD-10-CM | POA: Insufficient documentation

## 2013-03-18 ENCOUNTER — Ambulatory Visit: Payer: 59 | Admitting: Physical Therapy

## 2013-03-30 ENCOUNTER — Ambulatory Visit: Payer: 59 | Admitting: Physical Therapy

## 2013-03-30 ENCOUNTER — Telehealth: Payer: Self-pay | Admitting: Oncology

## 2013-03-30 NOTE — Telephone Encounter (Signed)
, °

## 2013-04-06 ENCOUNTER — Encounter: Payer: Self-pay | Admitting: Physical Therapy

## 2013-04-13 ENCOUNTER — Ambulatory Visit: Payer: 59 | Attending: Oncology | Admitting: Physical Therapy

## 2013-04-13 DIAGNOSIS — I890 Lymphedema, not elsewhere classified: Secondary | ICD-10-CM | POA: Insufficient documentation

## 2013-04-13 DIAGNOSIS — IMO0001 Reserved for inherently not codable concepts without codable children: Secondary | ICD-10-CM | POA: Insufficient documentation

## 2013-04-26 ENCOUNTER — Encounter: Payer: Self-pay | Admitting: Oncology

## 2013-04-26 ENCOUNTER — Telehealth: Payer: Self-pay | Admitting: *Deleted

## 2013-04-26 NOTE — Telephone Encounter (Signed)
Called patient to let her know Dr Laurelyn Sickle nurse will get with Dr Humphrey Rolls and will respond to her email regarding her stage. Pt had requested the stage in an email.

## 2013-04-27 ENCOUNTER — Ambulatory Visit: Payer: 59 | Admitting: Physical Therapy

## 2013-05-02 ENCOUNTER — Other Ambulatory Visit: Payer: Self-pay | Admitting: Emergency Medicine

## 2013-05-11 ENCOUNTER — Ambulatory Visit: Payer: 59 | Attending: Oncology | Admitting: Physical Therapy

## 2013-05-11 DIAGNOSIS — IMO0001 Reserved for inherently not codable concepts without codable children: Secondary | ICD-10-CM | POA: Insufficient documentation

## 2013-05-11 DIAGNOSIS — I890 Lymphedema, not elsewhere classified: Secondary | ICD-10-CM | POA: Insufficient documentation

## 2013-05-16 ENCOUNTER — Encounter (HOSPITAL_COMMUNITY): Payer: Self-pay

## 2013-05-16 ENCOUNTER — Ambulatory Visit: Payer: 59

## 2013-05-16 ENCOUNTER — Ambulatory Visit (HOSPITAL_COMMUNITY)
Admission: RE | Admit: 2013-05-16 | Discharge: 2013-05-16 | Disposition: A | Discharge status: Home / Self Care | Payer: 59 | Source: Ambulatory Visit | Attending: Oncology | Admitting: Oncology

## 2013-05-16 ENCOUNTER — Ambulatory Visit (HOSPITAL_BASED_OUTPATIENT_CLINIC_OR_DEPARTMENT_OTHER): Payer: 59

## 2013-05-16 DIAGNOSIS — C50919 Malignant neoplasm of unspecified site of unspecified female breast: Secondary | ICD-10-CM | POA: Insufficient documentation

## 2013-05-16 DIAGNOSIS — C50911 Malignant neoplasm of unspecified site of right female breast: Secondary | ICD-10-CM

## 2013-05-16 DIAGNOSIS — C50419 Malignant neoplasm of upper-outer quadrant of unspecified female breast: Secondary | ICD-10-CM

## 2013-05-16 DIAGNOSIS — C7800 Secondary malignant neoplasm of unspecified lung: Secondary | ICD-10-CM | POA: Insufficient documentation

## 2013-05-16 DIAGNOSIS — Z9010 Acquired absence of unspecified breast and nipple: Secondary | ICD-10-CM | POA: Insufficient documentation

## 2013-05-16 LAB — COMPREHENSIVE METABOLIC PANEL (CC13)
ALT: 87 U/L — ABNORMAL HIGH (ref 0–55)
AST: 51 U/L — ABNORMAL HIGH (ref 5–34)
Albumin: 4.4 g/dL (ref 3.5–5.0)
Alkaline Phosphatase: 56 U/L (ref 40–150)
Anion Gap: 12 meq/L — ABNORMAL HIGH (ref 3–11)
BUN: 11.7 mg/dL (ref 7.0–26.0)
CO2: 22 meq/L (ref 22–29)
Calcium: 10.5 mg/dL — ABNORMAL HIGH (ref 8.4–10.4)
Chloride: 102 meq/L (ref 98–109)
Creatinine: 0.8 mg/dL (ref 0.6–1.1)
Glucose: 93 mg/dL (ref 70–140)
Potassium: 4.2 meq/L (ref 3.5–5.1)
Sodium: 136 meq/L (ref 136–145)
Total Bilirubin: 0.26 mg/dL (ref 0.20–1.20)
Total Protein: 7.7 g/dL (ref 6.4–8.3)

## 2013-05-16 LAB — CBC WITH DIFFERENTIAL/PLATELET
BASO%: 0.3 % (ref 0.0–2.0)
Basophils Absolute: 0 10*3/uL (ref 0.0–0.1)
EOS%: 1.5 % (ref 0.0–7.0)
Eosinophils Absolute: 0.1 10*3/uL (ref 0.0–0.5)
HCT: 42 % (ref 34.8–46.6)
HGB: 14.5 g/dL (ref 11.6–15.9)
LYMPH%: 23.2 % (ref 14.0–49.7)
MCH: 32.2 pg (ref 25.1–34.0)
MCHC: 34.5 g/dL (ref 31.5–36.0)
MCV: 93.3 fL (ref 79.5–101.0)
MONO#: 0.5 10*3/uL (ref 0.1–0.9)
MONO%: 6 % (ref 0.0–14.0)
NEUT#: 5.4 10*3/uL (ref 1.5–6.5)
NEUT%: 69 % (ref 38.4–76.8)
NRBC: 0 % (ref 0–0)
Platelets: 284 10*3/uL (ref 145–400)
RBC: 4.5 10*6/uL (ref 3.70–5.45)
RDW: 12.7 % (ref 11.2–14.5)
WBC: 7.9 10*3/uL (ref 3.9–10.3)
lymph#: 1.8 10*3/uL (ref 0.9–3.3)

## 2013-05-16 MED ORDER — IOHEXOL 300 MG/ML  SOLN
100.0000 mL | Freq: Once | INTRAMUSCULAR | Status: AC | PRN
  Administered 2013-05-16: 100 mL via INTRAVENOUS

## 2013-05-16 NOTE — Progress Notes (Signed)
IV started for patient's CT appointment.

## 2013-05-17 ENCOUNTER — Other Ambulatory Visit: Payer: Self-pay

## 2013-05-17 ENCOUNTER — Ambulatory Visit (HOSPITAL_COMMUNITY): Payer: 59

## 2013-05-23 ENCOUNTER — Ambulatory Visit (HOSPITAL_BASED_OUTPATIENT_CLINIC_OR_DEPARTMENT_OTHER): Payer: 59 | Admitting: Oncology

## 2013-05-23 ENCOUNTER — Telehealth: Payer: Self-pay | Admitting: Oncology

## 2013-05-23 ENCOUNTER — Encounter: Payer: Self-pay | Admitting: Oncology

## 2013-05-23 VITALS — BP 145/68 | HR 64 | Temp 98.3°F | Resp 18 | Ht 63.0 in | Wt 181.2 lb

## 2013-05-23 DIAGNOSIS — C773 Secondary and unspecified malignant neoplasm of axilla and upper limb lymph nodes: Secondary | ICD-10-CM

## 2013-05-23 DIAGNOSIS — R599 Enlarged lymph nodes, unspecified: Secondary | ICD-10-CM

## 2013-05-23 DIAGNOSIS — Z853 Personal history of malignant neoplasm of breast: Secondary | ICD-10-CM

## 2013-05-23 DIAGNOSIS — C50919 Malignant neoplasm of unspecified site of unspecified female breast: Secondary | ICD-10-CM

## 2013-05-23 NOTE — Progress Notes (Signed)
[name] [name] [id number] 07-11-1960 53 y.o. 05/23/2013 3:10 PM  CC  Pcp Not In System No address on file  DIAGNOSIS: 53 year old female with invasive ductal carcinoma of the right breast originally diagnosed in 2005. She subsequently had a local regional recurrence to the right axilla for which she was treated at [facility].  Past Medical History:  #1 breast cancer as above  #2 isolated seizure April 2010 negative for workup with no recurrent episodes.  #3 elevated liver enzymes.  #4 DVT of the right IJ  #5 status post radiotherapy per history of present illness.  #6 no history of autoimmune illnesses collagen vascular disorders cardiac pacemakers or implanted defibrillator's.  Past Surgical History: #1 status post lumpectomy with sentinel node biopsy 2005.  #2 status post axillary lymph node dissection of the right axilla in December 2011.  INTERVAL HISTORY: Patient is seen in followup to discuss her CT scan results. She is accompanied by her husband. I went over the results in detail. We did compare these results with her prior PET scan as well as CT scan. The CT from 05/16/2013 reveals mediastinal lymphadenopathy progression. There is a necrotic 17 mm lymph node in the AP window which was previously 11 mm. 13 mm in 12/20/2012. Also noted is a 3.6 x 2.6 cm lymph node in the superior left hilum. 13m left internal mammary lymph node was stable in the interval. There was a new 5 mm anterior chest wall nodule. A regular pleural disease in the posterior left costophrenic sulcus progressed in the interval. There was also a 7 mm nodule along the left major fissure unchanged. CT of the abdomen shows liver with diffuse fatty changes without any focal mass lesion. Spleen stomach duodenum pancreas gallbladder and adrenal glands were unremarkable. Kidneys normal. Left periaortic lymph nodes cranial to the left renal vein measured 12 mm not changed. The pelvis shows no free intraperitoneal fluid  no pelvic sidewall lymphadenopathy no adnexal masses. Bone windows revealed no worrisome lytic or sclerotic osseous lesions. Patient clinically seems to be doing well except for anxiety. She denies having any cough hemoptysis hematemesis no nausea or vomiting. Remainder of the 10 point review of systems is negative.    Past Surgical History  Procedure Laterality Date  . Breast lumpectomy  2005  . Axillary lymph node dissection  Dec. 2011    Allergies: Allergies  Allergen Reactions  . Morphine And Related   . Tegaderm Ag Mesh [Silver]     Current Medications: Current Outpatient Prescriptions  Medication Sig Dispense Refill  . b complex vitamins capsule Take 1 capsule by mouth daily.      . Calcium-Magnesium-Vitamin D (CITRACAL CALCIUM+D PO) Take by mouth. 4081mvitamin C, 50062mitamin D3 2 PO DAILY      . folic acid (FOLVITE) 1 MG tablet Take 1 mg by mouth daily.      . Magnesium 100 MG CAPS Take 1 each by mouth. Taking 125m39m   . Melatonin 1 MG TABS Take 1 mg by mouth at bedtime as needed.      . tamoxifen (NOLVADEX) 20 MG tablet Take 1 tablet (20 mg total) by mouth daily.  30 tablet  4  . VITAMIN D, CHOLECALCIFEROL, PO Take 1,000 Units by mouth.      . ALPRAZolam (XANAX) 0.5 MG tablet Take 1 tablet (0.5 mg total) by mouth at bedtime as needed for sleep.  30 tablet  1   No current facility-administered medications for this visit.  Health Maintenance Mammogram: 11/2011 Colonoscopy: never Bone Density Scan: 2012 Pap Smear: not recent Eye Exam: 12/2011 Vitamin D Level: n/a Lipid Panel:  2011  REVIEW OF SYSTEMS: A 10 point review of systems was conducted and is otherwise negative except for what is noted above.     PHYSICAL EXAMINATION: Blood pressure 145/68, pulse 64, temperature 98.3 F (36.8 C), temperature source Oral, resp. rate 18, height _0  (1.6 m), weight 181 lb 3 oz (82.186 kg). General: Patient is a well appearing female in no acute distress HEENT: PERRLA,  sclerae anicteric no conjunctival pallor, MMM Neck: supple, no palpable adenopathy Lungs: clear to auscultation bilaterally, no wheezes, rhonchi, or rales Cardiovascular: regular rate rhythm, S1, S2, no murmurs, rubs or gallops Abdomen: Soft, non-tender, non-distended, normoactive bowel sounds, no HSM Extremities: warm and well perfused, no clubbing, cyanosis, or edema Skin: No rashes or lesions Neuro: Non-focal ECOG-1 Right breast: mastectomy site well healed, axillary scar healed, no nodularity, tender and slightly swollen, left breast, no masses or nodularity  LABS:    Chemistry      Component Value Date/Time   NA 136 05/16/2013 1139   NA 139 05/22/2011 1232   K 4.2 05/16/2013 1139   K 4.0 05/22/2011 1232   CL 105 05/06/2012 1333   CL 101 05/22/2011 1232   CO2 22 05/16/2013 1139   CO2 24 05/22/2011 1232   BUN 11.7 05/16/2013 1139   BUN 15 05/22/2011 1232   CREATININE 0.8 05/16/2013 1139   CREATININE 0.93 05/22/2011 1232      Component Value Date/Time   CALCIUM 10.5* 05/16/2013 1139   CALCIUM 9.9 05/22/2011 1232   ALKPHOS 56 05/16/2013 1139   ALKPHOS 50 05/22/2011 1232   AST 51* 05/16/2013 1139   AST 25 05/22/2011 1232   ALT 87* 05/16/2013 1139   ALT 29 05/22/2011 1232   BILITOT 0.26 05/16/2013 1139   BILITOT 0.3 05/22/2011 1232      Lab Results  Component Value Date   WBC 7.9 05/16/2013   HGB 14.5 05/16/2013   HCT 42.0 05/16/2013   MCV 93.3 05/16/2013   PLT 284 05/16/2013   CT CHEST, ABDOMEN, AND PELVIS WITH CONTRAST  TECHNIQUE: 05/16/13 Multidetector CT imaging of the chest, abdomen and pelvis was  performed following the standard protocol during bolus  administration of intravenous contrast.  CONTRAST: 170m OMNIPAQUE IOHEXOL 300 MG/ML SOLN  COMPARISON: PET-CT from 01/10/2013. Chest CT from 12/20/2012.  FINDINGS:  CT CHEST FINDINGS  Surgical clips are noted in the right axilla. No axillary  lymphadenopathy on either side. No supraclavicular lymphadenopathy.  Mediastinal  lymphadenopathy has progressed in the interval. A  necrotic 17 mm short axis lymph node in the AP window (image 21 of  series 2 today) was 11 mm in short axis on the PET-CT and 13 mm in  short axis on the 12/20/2012 exam. 3.6 x 2.6 cm lymph node is  identified in the superior left hilum. There is no subcarinal  lymphadenopathy. No right hilar lymphadenopathy.  8 mm short axis left internal mammary lymph node is stable in the  interval. There is a new 5 mm anterior chest wall nodule on image  25. Irregular pleural disease in the posterior left costophrenic  sulcus has progressed clearly in the interval.  7 mm nodule along the left major fissure (image image 35) is  unchanged.  Bone windows reveal no worrisome lytic or sclerotic osseous lesions.  CT ABDOMEN AND PELVIS FINDINGS  The liver shows diffuse fatty change  without focal mass lesion. The  spleen, stomach, duodenum, pancreas, gallbladder, and adrenal glands  are unremarkable. The kidneys are normal in appearance bilaterally.  Left para-aortic lymph node cranial to the left renal vein measures  12 mm in short axis on image 60. This is not substantially changed  in the interval. No other evidence for retroperitoneal or upper  abdominal lymphadenopathy.  Imaging through the pelvis shows no free intraperitoneal fluid. No  pelvic sidewall lymphadenopathy. Uterus is unremarkable. There is no  adnexal mass. The terminal ileum is normal. The appendix is normal.  Bone windows reveal no worrisome lytic or sclerotic osseous lesions.  IMPRESSION:  1. Interval progression of metastatic disease in the mediastinum and  involving the inferior left pleura. And upper left internal mammary  lymph node is stable but there is a new probable internal mammary  lymph node in the inferior left chest.  2. Stable nodule along the left major fissure.  3. Stable the slight increase in size of the high left para-aortic  lymph node in the abdomen. Attention to  this lymph node on followup  imaging recommended.  Electronically Signed  By: Misty Stanley M.D.  On: 05/16/2013 14:56      ASSESSMENT/PLAN:    53 year old female with   1. previous history of 1.7 cm ER positive breast cancer originally diagnosed in 2005. At that time patient underwent a lumpectomy with sentinel node biopsy followed by adjuvant chemotherapy consisting of 4 cycles of a.c. Thereafter she was offered endocrine therapy but she declined. She however did undergo radiation therapy. In 2011 patient had a local regional axillary recurrence that was diagnosed at [facility]. The core needle biopsy revealed a high-grade invasive ductal carcinoma that was ER positive HER-2/neu negative. Patient underwent neoadjuvant chemotherapy consisting of 3 cycles of Taxol and Cytoxan but discontinued about due to toxicity and intolerance to Taxol. She then went on to have right axillary lymph node dissection the final pathology revealed 3 of 8 lymph nodes positive for invasive cancer. She received local regional radiation therapy to the excellent as well as the supraclavicular region. Thereafter she was recommended antiestrogen therapy with aromatase inhibitors. She was started on letrozole but could not tolerate it therefore after she was transitioned to Aromasin she could not tolerate that well either. And in September 2012 she was transitioned to tamoxifen which she has been using. She continues to complain of significant fatigue she is constipated from the tamoxifen and she is tired. She also complains of having peripheral neuropathies. She also has noticed anxiety disturbances. She also has noticed some lymphedema in the right upper axillary region. She is working with Butch Penny in the outpatient rehabilitation setting for possible physical therapy and lymphedema therapy.  #2 patient with progressive lymphadenopathy. Largest lymph node measures 3.6 x 2.6 cm in the superior left hilum. There are other  subcentimeter nodes as well. She doesn't have any evidence of soft tissue disease or bony disease. We discussed the differential diagnosis. Certainly this could be progressive metastatic breast cancer. Since she has noted only disease it could also be used infection or another etiology. I have recommended that we proceed with a biopsy of the largest lymph node in the superior left hilum. I have referred her to be seen by Dr. Erasmo Leventhal of cardiothoracic surgery for further workup and evaluation and biopsy. Initially patient was taken to back. However after extensive discussion she is open to having the lymph node biopsy.  #3. I will plan on seeing her  back after she's had evaluation by Dr. [name] as well as the biopsy.  #4. Patient is encouraged to continue taking her tamoxifen 20 mg daily. She seems to be tolerating it quite nicely.  #5 hepatic steatosis: Patient will continue to exercise eating healthy.  #6. Right upper extremity lymphedema/breast edema: Patient continues to do her exercises that she learned from physical therapy. Her right breast looks much smaller than it had been. It is not as tender as previously.   All questions were answered. The patient knows to call the clinic with any problems, questions or concerns. We can certainly see the patient much sooner if necessary.  I spent 30 minutes counseling the patient face to face. The total time spent in the appointment was 40 minutes.   Marcy Panning, MD Medical/Oncology [facility] [phone] (beeper) [phone] (Office)  05/23/2013, 3:10 PM

## 2013-05-23 NOTE — Telephone Encounter (Signed)
, °

## 2013-05-24 ENCOUNTER — Telehealth: Payer: Self-pay | Admitting: Oncology

## 2013-05-24 NOTE — Telephone Encounter (Signed)
, °

## 2013-05-25 ENCOUNTER — Telehealth: Payer: Self-pay | Admitting: *Deleted

## 2013-05-25 ENCOUNTER — Encounter: Payer: Self-pay | Admitting: Oncology

## 2013-05-25 ENCOUNTER — Ambulatory Visit: Payer: 59 | Admitting: Physical Therapy

## 2013-05-25 NOTE — Telephone Encounter (Signed)
"  Just wanted to let you know something unusual happened this a.m. I went to blow my nose as usual but bright red blood came out. During the process of an hour I kept spitting up flem and blood. Dripping from my sinus passage. The blood was never dripping out my nose, but a one point when I spit up a dark black red clot came up a little smaller than a quarter. I have pressure still but no blood"    Provider input needed: nose bleed, history thrombosis   Reason for call: "Reporting symptoms to make sure Dr. 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  Patient was last seen in the office on 05-23-2013.  Next appt is after evaluation Dr. [name].  Biopsy by Dr. [name] scheduled for 05-27-2013   Is patient having fevers greater than 100.5?  no   Is patient having uncontrolled pain, or new pain? no   Is patient having new back pain that changes with position (worsens or eases when laying down?)  no   Is patient able to eat and drink? yes    Is patient able to pass stool without difficulty?   yes     Is patient having uncontrolled nausea?  no    patient calls 05/25/2013 with complaint of  Ears, nose, mouth, throat, and face: positive for epistaxis and "With dry weather periodically blowing nose, congestion with usual color.  This morning I blew my nose and bloody color that dripped down the back of my throat, then a chuck a bit smaller than a quarter was coughed out.  I placed ice on my nose and it stopped after a slow drip in my sinuses.  I currently feel a tightness in my nasal passages.  I want Dr. Humphrey Rolls to know.  I will tell Dr. [name] for the procedure."     Summary Based on the above information advised patient to lean forward and hold pressure.  Report to ER if bleeding doesn't stop within 10  minutes and definitely 20 minutes.  Try a saline nasal spray for moisture with the dry winter weather.  Seek medical attention immediately and not to use MyChart for a nosebleed or any urgent/emergent care need.  Will notify provider of this call.      [name], [name] Nery  05/25/2013, 5:59 PM   Background Info  [name] [name]   DOB: 12/25/1960   MR#: [id number]   CSN#   [id number] 05/25/2013

## 2013-05-26 ENCOUNTER — Encounter: Payer: 59 | Admitting: Thoracic Surgery (Cardiothoracic Vascular Surgery)

## 2013-05-26 ENCOUNTER — Ambulatory Visit: Payer: Self-pay | Admitting: Oncology

## 2013-05-27 ENCOUNTER — Institutional Professional Consult (permissible substitution) (INDEPENDENT_AMBULATORY_CARE_PROVIDER_SITE_OTHER): Payer: 59 | Admitting: Thoracic Surgery (Cardiothoracic Vascular Surgery)

## 2013-05-27 ENCOUNTER — Encounter (HOSPITAL_COMMUNITY): Payer: Self-pay | Admitting: Pharmacy Technician

## 2013-05-27 ENCOUNTER — Encounter: Payer: Self-pay | Admitting: Thoracic Surgery (Cardiothoracic Vascular Surgery)

## 2013-05-27 ENCOUNTER — Other Ambulatory Visit: Payer: Self-pay | Admitting: *Deleted

## 2013-05-27 VITALS — BP 141/99 | HR 110 | Resp 20 | Ht 63.0 in | Wt 181.0 lb

## 2013-05-27 DIAGNOSIS — R591 Generalized enlarged lymph nodes: Secondary | ICD-10-CM

## 2013-05-27 DIAGNOSIS — C50919 Malignant neoplasm of unspecified site of unspecified female breast: Secondary | ICD-10-CM

## 2013-05-27 DIAGNOSIS — R599 Enlarged lymph nodes, unspecified: Secondary | ICD-10-CM

## 2013-05-27 DIAGNOSIS — R590 Localized enlarged lymph nodes: Secondary | ICD-10-CM

## 2013-05-27 NOTE — Progress Notes (Signed)
PCP is Pcp Not In System Referring Provider is Deatra Robinson, MD  Chief Complaint  Patient presents with  . Lymphadenopathy    Surgical eval for biopsy    HPI: 53 yo woman presents with a cc/o enlarged lymph nodes  [name] is a 53 yo woman with a history of breast cancer(invasive ductal, ER +) dating back to 2005. She was treated with lumpectomy and sentinel node biopsy. This was followed by 4 cycles of chemotherapy. She had a recurrence in 2011 and had neoadjuvant chemo, followed by a complete right axillary dissection and postoperative radiation. She has subsequently been treated with letrozole, aromasin and now tamoxifen.  She has mediastinal adenopathy dating back to 2013. This has continued to progress despite hormonal therapy.  Past Medical History  Diagnosis Date  . Seizures 2010    Isolated incident.  . 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 Breast lumpectomy  2005  . Axillary lymph node dissection  Dec. 2011    Family history + COPD(genetic)- Mother +Prostate cancer- Father +Breast cancer- Sister +Kidney cancer - Brother    Social History History  Substance Use Topics  . Smoking status: Never Smoker   . Smokeless tobacco: Never Used  . Alcohol Use: No    Current Outpatient Prescriptions  Medication Sig Dispense Refill  . ALPRAZolam (XANAX) 0.5 MG tablet Take 1 tablet (0.5 mg total) by mouth at bedtime as needed for sleep.  30 tablet  1  . b complex vitamins capsule Take 1 capsule by mouth daily.      . Calcium-Magnesium-Vitamin D (CITRACAL CALCIUM+D PO) Take by mouth. 400mg  vitamin C, 500mg  vitamin D3 2 PO DAILY      . folic acid (FOLVITE) 1 MG tablet Take 1 mg by mouth daily.      . Magnesium 100 MG CAPS Take 250 each by mouth. Taking 250 mg per day      . Melatonin 1 MG TABS Take 1 mg by mouth at bedtime as needed.      . tamoxifen (NOLVADEX) 20 MG tablet Take 1  tablet (20 mg total) by mouth daily.  30 tablet  4  . VITAMIN D, CHOLECALCIFEROL, PO Take 1,000 Units by mouth.       No current facility-administered medications for this visit.    Allergies  Allergen Reactions  . Morphine And Related   . Tegaderm Ag Mesh [Silver]     Review of Systems  Constitutional: Positive for appetite change and fatigue.  Gastrointestinal: Positive for diarrhea and constipation.  Musculoskeletal:       Lymphedema right arm osteopenia  Neurological: Positive for seizures (in 2010) and numbness.  Hematological: Positive for adenopathy.  Psychiatric/Behavioral: The patient is nervous/anxious.   All other systems reviewed and are negative.    BP 141/99  Pulse 110  Resp 20  Ht 5\' 3"  (1.6 m)  Wt 181 lb (82.101 kg)  BMI 32.07 kg/m2  SpO2 98% Physical Exam  Vitals reviewed. Constitutional: She is oriented to person, place, and time. She appears well-developed and well-nourished. No distress.  HENT:  Head: Normocephalic and atraumatic.  Eyes: EOM are normal. Pupils are equal, round, and reactive to light.  Neck: Neck supple. No thyromegaly present.  Cardiovascular: Normal rate, regular rhythm, normal heart sounds and intact distal pulses.  Exam reveals no gallop and no friction rub.   No murmur heard.  Pulmonary/Chest: Effort normal and breath sounds normal. She has no wheezes. She has no rales.  Abdominal: Soft. There is no tenderness.  Musculoskeletal: She exhibits edema (right arm).  Lymphadenopathy:    She has no cervical adenopathy.  Neurological: She is alert and oriented to person, place, and time. No cranial nerve deficit.  Skin: Skin is warm and dry.     Diagnostic Tests: PET CT NUCLEAR MEDICINE PET SKULL BASE TO THIGH  FASTING BLOOD GLUCOSE: Value: 97 mg/dl  TECHNIQUE:  19.1 mCi F-18 FDG was injected intravenously. CT data was obtained  and used for attenuation correction and anatomic localization only.  (This was not acquired as a  diagnostic CT examination.) Additional  exam technical data entered on technologist worksheet.  COMPARISON: CT chest 12/20/2012 and CT abdomen pelvis 05/10/2012.  PET 02/02/2012.  FINDINGS:  NECK  Hypermetabolic focus in the lower right internal jugular station (CT  image 54) has no CT correlate. No additional areas of abnormal  hypermetabolism in the neck. CT images show no acute findings.  CHEST  AP window lymph nodes measure up to 11 mm with an SUV max of 9.7. A  high left internal mammary lymph node measures 10 mm, with an SUV  max of 6.5. Pleural thickening in the posteromedial lower left hemi  thorax has an SUV max of 10.0. No additional areas of abnormal  hypermetabolism in the chest.  CT images show no acute findings such as pericardial or pleural  effusion.  ABDOMEN/PELVIS  Retroperitoneal lymph nodes are seen in the abdomen, measuring up to  9 mm in short axis in the left periaortic station, with an SUV max  of 5.9. No additional areas of abnormal hypermetabolism in the  abdomen or pelvis.  CT images show low attenuation throughout the liver with a probable  focal area of fat deposition near the porta hepatis.  SKELETON  No focal hypermetabolic activity to suggest skeletal metastasis.  IMPRESSION:  1. Hypermetabolic high left internal mammary, mediastinal and  retroperitoneal adenopathy, most consistent with metastatic disease.  2. Hypermetabolic pleural thickening in the posteromedial aspect of  the lower left hemi thorax, also most consistent metastatic disease.  3. Hepatic steatosis.  Electronically Signed  By: Lorin Picket M.D.  On: 01/10/2013 16:17   CT CHEST FINDINGS:  CT CHEST FINDINGS  Surgical clips are noted in the right axilla. No axillary  lymphadenopathy on either side. No supraclavicular lymphadenopathy.  Mediastinal lymphadenopathy has progressed in the interval. A  necrotic 17 mm short axis lymph node in the AP window (image 21 of  series 2  today) was 11 mm in short axis on the PET-CT and 13 mm in  short axis on the 12/20/2012 exam. 3.6 x 2.6 cm lymph node is  identified in the superior left hilum. There is no subcarinal  lymphadenopathy. No right hilar lymphadenopathy.  8 mm short axis left internal mammary lymph node is stable in the  interval. There is a new 5 mm anterior chest wall nodule on image  25. Irregular pleural disease in the posterior left costophrenic  sulcus has progressed clearly in the interval.  7 mm nodule along the left major fissure (image image 35) is  unchanged.  Bone windows reveal no worrisome lytic or sclerotic osseous lesions.  Impression: 53 yo woman with a history of recurrent metastatic breast cancer who has progressive mediastinal adenopathy. These nodes have progressed while on Tamoxifen. This raises the question whether they do in fact represent metastatic breast cancer or  whether another process such as lymphoma, sarcoid or, less likely, an infectious etiology is responsible.   The largest nodes and most accessible are the AP window nodes. These do not appear to be accessible with bronchoscopy or EBUS. If it did turn out to be lymphoma, it would not be possible to make the diagnosis with those modalities.  I think the best way to biopsy the nodes is with a Chamberlain procedure(left anterior mediastinotomy).  I reviewed the films with Mrs Birnbaum and her husband and explained the reasoning behind that. They understand the general nature of the procedure including the need for general anesthesia and the incision to be used. They understand the indications, risks, benefits and alternatives. They understand this is a diagnostic and not a therapeutic procedure. We discussed the risks including those related to general anesthesia, such as hypoxia or MI, and those directly related to the biopsy such as bleeding, infection, and pneumothorax. She accepts the risks and agrees to proceed  Plan:  Left  anterior mediastinotomy on Thursday 06/02/2013  We will plan to do this as an outpatient

## 2013-05-30 ENCOUNTER — Telehealth: Payer: Self-pay | Admitting: *Deleted

## 2013-05-30 NOTE — Telephone Encounter (Signed)
Called patient per Dr Humphrey Rolls request to follow up on previous message. Pt states she has not had any further bleeding.

## 2013-05-30 NOTE — Telephone Encounter (Signed)
Great Thanks KK

## 2013-05-30 NOTE — Telephone Encounter (Signed)
Please call patient to follow up and see how she is doing today.  If no improvement she needs to be seen by [facility] today or tomorrow.  Discuss this with [facility] for appointment

## 2013-05-30 NOTE — Telephone Encounter (Signed)
Pt states she has not had anymore bleeding since that episode

## 2013-05-31 ENCOUNTER — Encounter (HOSPITAL_COMMUNITY)
Admission: RE | Admit: 2013-05-31 | Discharge: 2013-05-31 | Disposition: A | Discharge status: Home / Self Care | Payer: 59 | Source: Ambulatory Visit | Attending: Thoracic Surgery (Cardiothoracic Vascular Surgery) | Admitting: Thoracic Surgery (Cardiothoracic Vascular Surgery)

## 2013-05-31 ENCOUNTER — Encounter (HOSPITAL_COMMUNITY): Payer: Self-pay

## 2013-05-31 VITALS — BP 137/88 | HR 103 | Temp 97.6°F | Resp 20 | Ht 65.0 in | Wt 181.3 lb

## 2013-05-31 DIAGNOSIS — R590 Localized enlarged lymph nodes: Secondary | ICD-10-CM

## 2013-05-31 HISTORY — DX: Nausea with vomiting, unspecified: R11.2

## 2013-05-31 HISTORY — DX: Other specified postprocedural states: Z98.890

## 2013-05-31 HISTORY — DX: Peripheral vascular disease, unspecified: I73.9

## 2013-05-31 LAB — COMPREHENSIVE METABOLIC PANEL
ALK PHOS: 59 U/L (ref 39–117)
ALT: 71 U/L — AB (ref 0–35)
AST: 41 U/L — ABNORMAL HIGH (ref 0–37)
Albumin: 3.6 g/dL (ref 3.5–5.2)
BUN: 14 mg/dL (ref 6–23)
CALCIUM: 9.5 mg/dL (ref 8.4–10.5)
CO2: 20 mEq/L (ref 19–32)
Chloride: 98 mEq/L (ref 96–112)
Creatinine, Ser: 0.71 mg/dL (ref 0.50–1.10)
GFR calc Af Amer: >90 mL/min (ref >90)
Glucose, Bld: 95 mg/dL (ref 70–99)
POTASSIUM: 4.4 meq/L (ref 3.7–5.3)
SODIUM: 135 meq/L — AB (ref 137–147)
Total Bilirubin: 0.2 mg/dL — ABNORMAL LOW (ref 0.3–1.2)
Total Protein: 7.2 g/dL (ref 6.0–8.3)

## 2013-05-31 LAB — URINALYSIS, ROUTINE W REFLEX MICROSCOPIC
Bilirubin Urine: NEGATIVE
GLUCOSE, UA: NEGATIVE mg/dL
Hgb urine dipstick: NEGATIVE
Ketones, ur: NEGATIVE mg/dL
LEUKOCYTES UA: NEGATIVE
Nitrite: NEGATIVE
Protein, ur: NEGATIVE mg/dL
Specific Gravity, Urine: 1.008 (ref 1.005–1.030)
Urobilinogen, UA: 0.2 mg/dL (ref 0.0–1.0)
pH: 6.5 (ref 5.0–8.0)

## 2013-05-31 LAB — ABO/RH: ABO/RH(D): AB POS

## 2013-05-31 LAB — CBC
HCT: 37.8 % (ref 36.0–46.0)
Hemoglobin: 13.4 g/dL (ref 12.0–15.0)
MCH: 33 pg (ref 26.0–34.0)
MCHC: 35.4 g/dL (ref 30.0–36.0)
MCV: 93.1 fL (ref 78.0–100.0)
PLATELETS: 350 10*3/uL (ref 150–400)
RBC: 4.06 MIL/uL (ref 3.87–5.11)
RDW: 12.2 % (ref 11.5–15.5)
WBC: 6.8 10*3/uL (ref 4.0–10.5)

## 2013-05-31 LAB — BLOOD GAS, ARTERIAL
Acid-Base Excess: 0.6 mmol/L (ref 0.0–2.0)
BICARBONATE: 23.6 meq/L (ref 20.0–24.0)
DRAWN BY: 206361
FIO2: 0.21 %
O2 Saturation: 98.8 %
PCO2 ART: 31.3 mmHg — AB (ref 35.0–45.0)
Patient temperature: 98.6
TCO2: 24.6 mmol/L (ref 0–100)
pH, Arterial: 7.49 — ABNORMAL HIGH (ref 7.350–7.450)
pO2, Arterial: 105 mmHg — ABNORMAL HIGH (ref 80.0–100.0)

## 2013-05-31 LAB — PROTIME-INR
INR: 1.02 (ref 0.00–1.49)
Prothrombin Time: 13.2 seconds (ref 11.6–15.2)

## 2013-05-31 LAB — APTT: APTT: 29 s (ref 24–37)

## 2013-05-31 LAB — SURGICAL PCR SCREEN
MRSA, PCR: NEGATIVE
Staphylococcus aureus: NEGATIVE

## 2013-05-31 LAB — TYPE AND SCREEN
ABO/RH(D): AB POS
Antibody Screen: NEGATIVE

## 2013-05-31 NOTE — Pre-Procedure Instructions (Signed)
[name] [name]  05/31/2013   Your procedure is scheduled on:  Thursday March 5 th at 0800 AM  Report to [facility]  Entrance "A"  at 0600 AM.  Call this number if you have problems the morning of surgery: [phone]   Remember:   Do not eat food or drink liquids after midnight Wednesday.   Take these medicines the morning of surgery with A SIP OF WATER: None  Stop Vitamins, Herbal medications, and Nsaids as of today.   Do not wear jewelry, make-up or nail polish.  Do not wear lotions, powders, or perfumes. You may wear deodorant.  Do not shave 48 hours prior to surgery.   Do not bring valuables to the hospital.  [facility] is not responsible for any belongings or valuables.               Contacts, dentures or bridgework may not be worn into surgery.  Leave suitcase in the car. After surgery it may be brought to your room.  For patients admitted to the hospital, discharge time is determined by your  treatment team.               Patients discharged the day of surgery will not be allowed to drive home.    Special Instructions: [place] - Preparing for Surgery  Before surgery, you can play an important role.  Because skin is not sterile, your skin needs to be as free of germs as possible.  You can reduce the number of germs on you skin by washing with CHG (chlorahexidine gluconate) soap before surgery.  CHG is an antiseptic cleaner which kills germs and bonds with the skin to continue killing germs even after washing.  Please DO NOT use if you have an allergy to CHG or antibacterial soaps.  If your skin becomes reddened/irritated stop using the CHG and inform your nurse when you arrive at Short Stay.  Do not shave (including legs and underarms) for at least 48 hours prior to the first CHG shower.  You may shave your face.  Please follow these instructions carefully:   1.  Shower with CHG Soap the night before surgery and the morning of Surgery.  2.  If you  choose to wash your hair, wash your hair first as usual with your normal shampoo.  3.  After you shampoo, rinse your hair and body thoroughly to remove the Shampoo.  4.  Use CHG as you would any other liquid soap.  You can apply chg directly to the skin and wash gently with scrungie or a clean washcloth.  5.  Apply the CHG Soap to your body ONLY FROM THE NECK DOWN.   Do not use on open wounds or open sores.  Avoid contact with your eyes, ears, mouth and genitals (private parts).  Wash genitals (private parts) with your normal soap.  6.  Wash thoroughly, paying special attention to the area where your surgery  will be performed.  7.  Thoroughly rinse your body with warm water from the neck down.  8.  DO NOT shower/wash with your normal soap after using and rinsing off   the CHG Soap.  9.  Pat yourself dry with a clean towel.            10.  Wear clean pajamas.            11.  Place clean sheets on your bed the night of your first shower and  do not sleep with pets.  Day of Surgery  Do not apply any lotions/deoderants the morning of surgery.  Please wear clean clothes to the hospital/surgery center.      Please read over the following fact sheets that you were given: Pain Booklet, Coughing and Deep Breathing, Blood Transfusion Information, MRSA Information and Surgical Site Infection Prevention

## 2013-06-01 MED ORDER — DEXTROSE 5 % IV SOLN
1.5000 g | INTRAVENOUS | Status: AC
  Administered 2013-06-02: 1.5 g via INTRAVENOUS
  Filled 2013-06-01: qty 1.5

## 2013-06-02 ENCOUNTER — Ambulatory Visit (HOSPITAL_COMMUNITY)
Admission: RE | Admit: 2013-06-02 | Discharge: 2013-06-02 | Disposition: A | Discharge status: Home / Self Care | Payer: 59 | Source: Ambulatory Visit | Attending: Thoracic Surgery (Cardiothoracic Vascular Surgery) | Admitting: Thoracic Surgery (Cardiothoracic Vascular Surgery)

## 2013-06-02 ENCOUNTER — Encounter (HOSPITAL_COMMUNITY)
Admission: RE | Disposition: A | Discharge status: Home / Self Care | Payer: Self-pay | Source: Ambulatory Visit | Attending: Thoracic Surgery (Cardiothoracic Vascular Surgery)

## 2013-06-02 ENCOUNTER — Encounter (HOSPITAL_COMMUNITY): Payer: 59 | Admitting: Certified Registered"

## 2013-06-02 ENCOUNTER — Encounter (HOSPITAL_COMMUNITY): Payer: Self-pay | Admitting: *Deleted

## 2013-06-02 ENCOUNTER — Ambulatory Visit (HOSPITAL_COMMUNITY): Payer: 59 | Admitting: Certified Registered"

## 2013-06-02 DIAGNOSIS — Z01818 Encounter for other preprocedural examination: Secondary | ICD-10-CM | POA: Insufficient documentation

## 2013-06-02 DIAGNOSIS — R569 Unspecified convulsions: Secondary | ICD-10-CM | POA: Insufficient documentation

## 2013-06-02 DIAGNOSIS — R590 Localized enlarged lymph nodes: Secondary | ICD-10-CM

## 2013-06-02 DIAGNOSIS — R599 Enlarged lymph nodes, unspecified: Secondary | ICD-10-CM

## 2013-06-02 DIAGNOSIS — Z86718 Personal history of other venous thrombosis and embolism: Secondary | ICD-10-CM | POA: Insufficient documentation

## 2013-06-02 DIAGNOSIS — Z9221 Personal history of antineoplastic chemotherapy: Secondary | ICD-10-CM | POA: Insufficient documentation

## 2013-06-02 DIAGNOSIS — I739 Peripheral vascular disease, unspecified: Secondary | ICD-10-CM | POA: Insufficient documentation

## 2013-06-02 DIAGNOSIS — Z01810 Encounter for preprocedural cardiovascular examination: Secondary | ICD-10-CM | POA: Insufficient documentation

## 2013-06-02 DIAGNOSIS — Z853 Personal history of malignant neoplasm of breast: Secondary | ICD-10-CM | POA: Insufficient documentation

## 2013-06-02 DIAGNOSIS — Z923 Personal history of irradiation: Secondary | ICD-10-CM | POA: Insufficient documentation

## 2013-06-02 DIAGNOSIS — Z01812 Encounter for preprocedural laboratory examination: Secondary | ICD-10-CM | POA: Insufficient documentation

## 2013-06-02 DIAGNOSIS — C771 Secondary and unspecified malignant neoplasm of intrathoracic lymph nodes: Secondary | ICD-10-CM | POA: Insufficient documentation

## 2013-06-02 HISTORY — PX: MEDIASTINOTOMY CHAMBERLAIN MCNEIL: SHX5966

## 2013-06-02 SURGERY — MEDIASTINOTOMY, CHAMBERLAIN
Anesthesia: General | Laterality: Left

## 2013-06-02 MED ORDER — MIDAZOLAM HCL 2 MG/2ML IJ SOLN
INTRAMUSCULAR | Status: AC
  Filled 2013-06-02: qty 2

## 2013-06-02 MED ORDER — MIDAZOLAM HCL 5 MG/5ML IJ SOLN
INTRAMUSCULAR | Status: DC | PRN
  Administered 2013-06-02 (×3): 1 mg via INTRAVENOUS

## 2013-06-02 MED ORDER — ROCURONIUM BROMIDE 100 MG/10ML IV SOLN
INTRAVENOUS | Status: DC | PRN
  Administered 2013-06-02: 50 mg via INTRAVENOUS

## 2013-06-02 MED ORDER — DEXAMETHASONE SODIUM PHOSPHATE 10 MG/ML IJ SOLN
INTRAMUSCULAR | Status: DC | PRN
  Administered 2013-06-02: 10 mg via INTRAVENOUS

## 2013-06-02 MED ORDER — GLYCOPYRROLATE 0.2 MG/ML IJ SOLN
INTRAMUSCULAR | Status: DC | PRN
  Administered 2013-06-02: 0.6 mg via INTRAVENOUS

## 2013-06-02 MED ORDER — HYDROMORPHONE HCL PF 1 MG/ML IJ SOLN
0.2500 mg | INTRAMUSCULAR | Status: DC | PRN
  Administered 2013-06-02: 0.25 mg via INTRAVENOUS

## 2013-06-02 MED ORDER — SODIUM CHLORIDE 0.9 % IJ SOLN: 3.0000 mL | INTRAMUSCULAR | Status: DC | PRN

## 2013-06-02 MED ORDER — MEPERIDINE HCL 25 MG/ML IJ SOLN: 6.2500 mg | INTRAMUSCULAR | Status: DC | PRN

## 2013-06-02 MED ORDER — PROPOFOL 10 MG/ML IV BOLUS
INTRAVENOUS | Status: DC | PRN
  Administered 2013-06-02: 10 mg via INTRAVENOUS
  Administered 2013-06-02: 100 mg via INTRAVENOUS

## 2013-06-02 MED ORDER — LIDOCAINE HCL (CARDIAC) 20 MG/ML IV SOLN
INTRAVENOUS | Status: DC | PRN
  Administered 2013-06-02: 100 mg via INTRAVENOUS

## 2013-06-02 MED ORDER — ONDANSETRON HCL 4 MG/2ML IJ SOLN: 4.0000 mg | Freq: Once | INTRAMUSCULAR | Status: DC | PRN

## 2013-06-02 MED ORDER — ONDANSETRON HCL 4 MG/2ML IJ SOLN
INTRAMUSCULAR | Status: AC
  Filled 2013-06-02: qty 2

## 2013-06-02 MED ORDER — OXYCODONE HCL 5 MG/5ML PO SOLN
5.0000 mg | Freq: Once | ORAL | Status: DC | PRN
  Administered 2013-06-02: 5 mg via ORAL

## 2013-06-02 MED ORDER — NEOSTIGMINE METHYLSULFATE 1 MG/ML IJ SOLN
INTRAMUSCULAR | Status: AC
  Filled 2013-06-02: qty 10

## 2013-06-02 MED ORDER — NEOSTIGMINE METHYLSULFATE 1 MG/ML IJ SOLN
INTRAMUSCULAR | Status: DC | PRN
  Administered 2013-06-02: 4 mg via INTRAVENOUS

## 2013-06-02 MED ORDER — ACETAMINOPHEN 325 MG PO TABS
650.0000 mg | ORAL_TABLET | ORAL | Status: DC | PRN
  Filled 2013-06-02: qty 2

## 2013-06-02 MED ORDER — FENTANYL CITRATE 0.05 MG/ML IJ SOLN
INTRAMUSCULAR | Status: AC
  Filled 2013-06-02: qty 5

## 2013-06-02 MED ORDER — PROPOFOL 10 MG/ML IV BOLUS
INTRAVENOUS | Status: AC
  Filled 2013-06-02: qty 20

## 2013-06-02 MED ORDER — BUPIVACAINE HCL 0.5 % IJ SOLN
INTRAMUSCULAR | Status: DC | PRN
  Administered 2013-06-02: 30 mL

## 2013-06-02 MED ORDER — SODIUM CHLORIDE 0.9 % IJ SOLN: 3.0000 mL | Freq: Two times a day (BID) | INTRAMUSCULAR | Status: DC

## 2013-06-02 MED ORDER — OXYCODONE HCL 5 MG PO TABS: 5.0000 mg | ORAL_TABLET | Freq: Once | ORAL | Status: DC | PRN

## 2013-06-02 MED ORDER — SODIUM CHLORIDE 0.9 % IV SOLN: 250.0000 mL | INTRAVENOUS | Status: DC | PRN

## 2013-06-02 MED ORDER — OXYCODONE HCL 5 MG PO TABS: 5.0000 mg | ORAL_TABLET | ORAL | Status: DC | PRN

## 2013-06-02 MED ORDER — ROCURONIUM BROMIDE 50 MG/5ML IV SOLN
INTRAVENOUS | Status: AC
  Filled 2013-06-02: qty 1

## 2013-06-02 MED ORDER — GLYCOPYRROLATE 0.2 MG/ML IJ SOLN
INTRAMUSCULAR | Status: AC
  Filled 2013-06-02: qty 3

## 2013-06-02 MED ORDER — ACETAMINOPHEN 650 MG RE SUPP
650.0000 mg | RECTAL | Status: DC | PRN
  Filled 2013-06-02: qty 1

## 2013-06-02 MED ORDER — ONDANSETRON HCL 4 MG/2ML IJ SOLN
INTRAMUSCULAR | Status: DC | PRN
  Administered 2013-06-02 (×2): 4 mg via INTRAVENOUS

## 2013-06-02 MED ORDER — HEMOSTATIC AGENTS (NO CHARGE) OPTIME
TOPICAL | Status: DC | PRN
  Administered 2013-06-02: 1 via TOPICAL

## 2013-06-02 MED ORDER — OXYCODONE HCL 5 MG/5ML PO SOLN
ORAL | Status: AC
  Administered 2013-06-02: 5 mg via ORAL
  Filled 2013-06-02: qty 5

## 2013-06-02 MED ORDER — HYDROMORPHONE HCL PF 1 MG/ML IJ SOLN
INTRAMUSCULAR | Status: AC
  Administered 2013-06-02: 0.25 mg via INTRAVENOUS
  Filled 2013-06-02: qty 1

## 2013-06-02 MED ORDER — LIDOCAINE HCL (CARDIAC) 20 MG/ML IV SOLN
INTRAVENOUS | Status: AC
  Filled 2013-06-02: qty 5

## 2013-06-02 MED ORDER — LACTATED RINGERS IV SOLN
INTRAVENOUS | Status: DC | PRN
  Administered 2013-06-02 (×2): via INTRAVENOUS

## 2013-06-02 MED ORDER — 0.9 % SODIUM CHLORIDE (POUR BTL) OPTIME
TOPICAL | Status: DC | PRN
  Administered 2013-06-02: 1000 mL

## 2013-06-02 MED ORDER — FENTANYL CITRATE 0.05 MG/ML IJ SOLN
INTRAMUSCULAR | Status: DC | PRN
  Administered 2013-06-02 (×5): 50 ug via INTRAVENOUS

## 2013-06-02 MED ORDER — SODIUM CHLORIDE 0.9 % IV SOLN
10.0000 mg | INTRAVENOUS | Status: DC | PRN
  Administered 2013-06-02: 5 ug/min via INTRAVENOUS

## 2013-06-02 MED ORDER — MIDAZOLAM HCL 2 MG/2ML IJ SOLN
INTRAMUSCULAR | Status: AC
Start: 2013-06-02 — End: 2013-06-02
  Filled 2013-06-02: qty 2

## 2013-06-02 SURGICAL SUPPLY — 53 items
ADH SKN CLS APL DERMABOND .7 (GAUZE/BANDAGES/DRESSINGS) ×1
ADH SKN CLS LQ APL DERMABOND (GAUZE/BANDAGES/DRESSINGS) ×1
APPLIER CLIP LOGIC TI 5 (MISCELLANEOUS) IMPLANT
APR CLP MED LRG 33X5 (MISCELLANEOUS)
CANISTER SUCTION 2500CC (MISCELLANEOUS) ×3 IMPLANT
CLIP TI MEDIUM 24 (CLIP) ×3 IMPLANT
CONT SPEC 4OZ CLIKSEAL STRL BL (MISCELLANEOUS) ×10 IMPLANT
COVER SURGICAL LIGHT HANDLE (MISCELLANEOUS) ×3 IMPLANT
DERMABOND ADHESIVE PROPEN (GAUZE/BANDAGES/DRESSINGS) ×2
DERMABOND ADVANCED (GAUZE/BANDAGES/DRESSINGS) ×2
DERMABOND ADVANCED .7 DNX12 (GAUZE/BANDAGES/DRESSINGS) ×1 IMPLANT
DERMABOND ADVANCED .7 DNX6 (GAUZE/BANDAGES/DRESSINGS) IMPLANT
DRAPE CHEST BREAST 15X10 FENES (DRAPES) ×3 IMPLANT
ELECT REM PT RETURN 9FT ADLT (ELECTROSURGICAL) ×3
ELECTRODE REM PT RTRN 9FT ADLT (ELECTROSURGICAL) ×1 IMPLANT
GAUZE SPONGE 4X4 16PLY XRAY LF (GAUZE/BANDAGES/DRESSINGS) IMPLANT
GLOVE BIOGEL PI IND STRL 6.5 (GLOVE) IMPLANT
GLOVE BIOGEL PI INDICATOR 6.5 (GLOVE) ×4
GLOVE ECLIPSE 6.5 STRL STRAW (GLOVE) ×4 IMPLANT
GLOVE SURG SIGNA 7.5 PF LTX (GLOVE) ×3 IMPLANT
GOWN STRL REUS W/ TWL LRG LVL3 (GOWN DISPOSABLE) ×1 IMPLANT
GOWN STRL REUS W/ TWL XL LVL3 (GOWN DISPOSABLE) ×1 IMPLANT
GOWN STRL REUS W/TWL LRG LVL3 (GOWN DISPOSABLE) ×6
GOWN STRL REUS W/TWL XL LVL3 (GOWN DISPOSABLE) ×3
HEMOSTAT SURGICEL 2X14 (HEMOSTASIS) IMPLANT
KIT BASIN OR (CUSTOM PROCEDURE TRAY) ×3 IMPLANT
KIT ROOM TURNOVER OR (KITS) ×3 IMPLANT
NEEDLE 22X1 1/2 (OR ONLY) (NEEDLE) ×3 IMPLANT
NS IRRIG 1000ML POUR BTL (IV SOLUTION) ×3 IMPLANT
PACK GENERAL/GYN (CUSTOM PROCEDURE TRAY) ×3 IMPLANT
PAD ARMBOARD 7.5X6 YLW CONV (MISCELLANEOUS) ×6 IMPLANT
SPONGE GAUZE 4X4 12PLY (GAUZE/BANDAGES/DRESSINGS) ×1 IMPLANT
SPONGE INTESTINAL PEANUT (DISPOSABLE) ×2 IMPLANT
SPONGE LAP 4X18 X RAY DECT (DISPOSABLE) ×2 IMPLANT
SUT SILK 2 0 TIES 10X30 (SUTURE) IMPLANT
SUT SILK 3 0 SH CR/8 (SUTURE) ×3 IMPLANT
SUT VIC AB 1 CTX 36 (SUTURE) ×3
SUT VIC AB 1 CTX36XBRD ANBCTR (SUTURE) IMPLANT
SUT VIC AB 2-0 CT1 27 (SUTURE) ×3
SUT VIC AB 2-0 CT1 TAPERPNT 27 (SUTURE) ×1 IMPLANT
SUT VIC AB 3-0 SH 18 (SUTURE) IMPLANT
SUT VIC AB 3-0 SH 27 (SUTURE) ×3
SUT VIC AB 3-0 SH 27X BRD (SUTURE) ×1 IMPLANT
SUT VIC AB 3-0 X1 27 (SUTURE) ×3 IMPLANT
SUT VICRYL 0 UR6 27IN ABS (SUTURE) IMPLANT
SWAB COLLECTION DEVICE MRSA (MISCELLANEOUS) IMPLANT
SYR CONTROL 10ML LL (SYRINGE) ×3 IMPLANT
SYRINGE 10CC LL (SYRINGE) ×3 IMPLANT
TOWEL OR 17X24 6PK STRL BLUE (TOWEL DISPOSABLE) ×3 IMPLANT
TOWEL OR 17X26 10 PK STRL BLUE (TOWEL DISPOSABLE) ×3 IMPLANT
TRAY FOLEY CATH 16FRSI W/METER (SET/KITS/TRAYS/PACK) ×1 IMPLANT
TUBE ANAEROBIC SPECIMEN COL (MISCELLANEOUS) IMPLANT
WATER STERILE IRR 1000ML POUR (IV SOLUTION) ×3 IMPLANT

## 2013-06-02 NOTE — Discharge Instructions (Signed)
Do not engage in heavy physical activity for 24 hours  After that your activities are unrestricted, but be cautious as physical activity may cause discomfort  Do not drive for 1 week  You may shower tomorrow  There is a medical adhesive on your incision (Dermabond)- It will begin to peel off in a week to 10 days  My office and Dr. Laurelyn Sickle office will call you to arrange follow up appointments  If you notice drainage from or redness around the incision or have a fever > 101- contact my office at [phone]  You have been given a prescription for pain medication - oxycodone- use as directed  You may use tylenol or ibuprofen instead of or in addition to the oxycodone

## 2013-06-02 NOTE — Brief Op Note (Signed)
06/02/2013  9:34 AM  PATIENT:  [name] [name]  53 y.o. female  PRE-OPERATIVE DIAGNOSIS:  MEDIASTINAL ADENOPATHY  POST-OPERATIVE DIAGNOSIS:  MEDIASTINAL ADENOPATHY  PROCEDURE:  Procedure(s) with comments: MEDIASTINOTOMY CHAMBERLAIN MCNEIL (Left) - LEFT ANTERIOR MEDIASTINOTOMY   SURGEON:  Surgeon(s) and Role:    * [name], MD - Primary   ANESTHESIA:   local and general  EBL:  Total I/O In: 700 [I.V.:700] Out: -   BLOOD ADMINISTERED:none  DRAINS: none   LOCAL MEDICATIONS USED:  MARCAINE    and Amount: 10 ml  SPECIMEN:  Source of Specimen:  AP window nodes  DISPOSITION OF SPECIMEN:  PATHOLOGY   PLAN OF CARE: Discharge to home after PACU  PATIENT DISPOSITION:  PACU - hemodynamically stable.   Delay start of Pharmacological VTE agent (>24hrs) due to surgical blood loss or risk of bleeding: not applicable  Frozen:  Probable metastatic breast cancer

## 2013-06-02 NOTE — Anesthesia Postprocedure Evaluation (Signed)
Anesthesia Post Note  Patient: [name] [name]  Procedure(s) Performed: Procedure(s) (LRB): MEDIASTINOTOMY CHAMBERLAIN MCNEIL (Left)  Anesthesia type: general  Patient location: PACU  Post pain: Pain level controlled  Post assessment: Patient's Cardiovascular Status Stable  Last Vitals:  Filed Vitals:   06/02/13 1100  BP:   Pulse: 81  Temp: 36.4 C  Resp: 18    Post vital signs: Reviewed and stable  Level of consciousness: sedated  Complications: No apparent anesthesia complications

## 2013-06-02 NOTE — Anesthesia Procedure Notes (Signed)
Procedure Name: Intubation Date/Time: 06/02/2013 8:16 AM Performed by: Octavio Graves Pre-anesthesia Checklist: Patient identified, Timeout performed, Emergency Drugs available, Suction available and Patient being monitored Patient Re-evaluated:Patient Re-evaluated prior to inductionOxygen Delivery Method: Circle system utilized Preoxygenation: Pre-oxygenation with 100% oxygen Intubation Type: IV induction Ventilation: Mask ventilation without difficulty Laryngoscope Size: Miller and 2 Grade View: Grade I Tube type: Oral Tube size: 7.0 mm Airway Equipment and Method: Stylet Placement Confirmation: ETT inserted through vocal cords under direct vision,  breath sounds checked- equal and bilateral and positive ETCO2 Secured at: 21 cm Tube secured with: Tape Dental Injury: Teeth and Oropharynx as per pre-operative assessment  Comments: IV induction [place]sey - intubation AM CRNA- atraumatic teeth and mouth as preop

## 2013-06-02 NOTE — H&P (View-Only) (Signed)
PCP is Pcp Not In System Referring Provider is Deatra Robinson, MD  Chief Complaint  Patient presents with  . Lymphadenopathy    Surgical eval for biopsy    HPI: 53 yo woman presents with a cc/o enlarged lymph nodes  [name] is a 53 yo woman with a history of breast cancer(invasive ductal, ER +) dating back to 2005. She was treated with lumpectomy and sentinel node biopsy. This was followed by 4 cycles of chemotherapy. She had a recurrence in 2011 and had neoadjuvant chemo, followed by a complete right axillary dissection and postoperative radiation. She has subsequently been treated with letrozole, aromasin and now tamoxifen.  She has mediastinal adenopathy dating back to 2013. This has continued to progress despite hormonal therapy.  Past Medical History  Diagnosis Date  . Seizures 2010    Isolated incident.  . 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 Breast lumpectomy  2005  . Axillary lymph node dissection  Dec. 2011    Family history + COPD(genetic)- Mother +Prostate cancer- Father +Breast cancer- Sister +Kidney cancer - Brother    Social History History  Substance Use Topics  . Smoking status: Never Smoker   . Smokeless tobacco: Never Used  . Alcohol Use: No    Current Outpatient Prescriptions  Medication Sig Dispense Refill  . ALPRAZolam (XANAX) 0.5 MG tablet Take 1 tablet (0.5 mg total) by mouth at bedtime as needed for sleep.  30 tablet  1  . b complex vitamins capsule Take 1 capsule by mouth daily.      . Calcium-Magnesium-Vitamin D (CITRACAL CALCIUM+D PO) Take by mouth. 400mg  vitamin C, 500mg  vitamin D3 2 PO DAILY      . folic acid (FOLVITE) 1 MG tablet Take 1 mg by mouth daily.      . Magnesium 100 MG CAPS Take 250 each by mouth. Taking 250 mg per day      . Melatonin 1 MG TABS Take 1 mg by mouth at bedtime as needed.      . tamoxifen (NOLVADEX) 20 MG tablet Take 1  tablet (20 mg total) by mouth daily.  30 tablet  4  . VITAMIN D, CHOLECALCIFEROL, PO Take 1,000 Units by mouth.       No current facility-administered medications for this visit.    Allergies  Allergen Reactions  . Morphine And Related   . Tegaderm Ag Mesh [Silver]     Review of Systems  Constitutional: Positive for appetite change and fatigue.  Gastrointestinal: Positive for diarrhea and constipation.  Musculoskeletal:       Lymphedema right arm osteopenia  Neurological: Positive for seizures (in 2010) and numbness.  Hematological: Positive for adenopathy.  Psychiatric/Behavioral: The patient is nervous/anxious.   All other systems reviewed and are negative.    BP 141/99  Pulse 110  Resp 20  Ht 5\' 3"  (1.6 m)  Wt 181 lb (82.101 kg)  BMI 32.07 kg/m2  SpO2 98% Physical Exam  Vitals reviewed. Constitutional: She is oriented to person, place, and time. She appears well-developed and well-nourished. No distress.  HENT:  Head: Normocephalic and atraumatic.  Eyes: EOM are normal. Pupils are equal, round, and reactive to light.  Neck: Neck supple. No thyromegaly present.  Cardiovascular: Normal rate, regular rhythm, normal heart sounds and intact distal pulses.  Exam reveals no gallop and no friction rub.   No murmur heard.  Pulmonary/Chest: Effort normal and breath sounds normal. She has no wheezes. She has no rales.  Abdominal: Soft. There is no tenderness.  Musculoskeletal: She exhibits edema (right arm).  Lymphadenopathy:    She has no cervical adenopathy.  Neurological: She is alert and oriented to person, place, and time. No cranial nerve deficit.  Skin: Skin is warm and dry.     Diagnostic Tests: PET CT NUCLEAR MEDICINE PET SKULL BASE TO THIGH  FASTING BLOOD GLUCOSE: Value: 97 mg/dl  TECHNIQUE:  19.1 mCi F-18 FDG was injected intravenously. CT data was obtained  and used for attenuation correction and anatomic localization only.  (This was not acquired as a  diagnostic CT examination.) Additional  exam technical data entered on technologist worksheet.  COMPARISON: CT chest 12/20/2012 and CT abdomen pelvis 05/10/2012.  PET 02/02/2012.  FINDINGS:  NECK  Hypermetabolic focus in the lower right internal jugular station (CT  image 54) has no CT correlate. No additional areas of abnormal  hypermetabolism in the neck. CT images show no acute findings.  CHEST  AP window lymph nodes measure up to 11 mm with an SUV max of 9.7. A  high left internal mammary lymph node measures 10 mm, with an SUV  max of 6.5. Pleural thickening in the posteromedial lower left hemi  thorax has an SUV max of 10.0. No additional areas of abnormal  hypermetabolism in the chest.  CT images show no acute findings such as pericardial or pleural  effusion.  ABDOMEN/PELVIS  Retroperitoneal lymph nodes are seen in the abdomen, measuring up to  9 mm in short axis in the left periaortic station, with an SUV max  of 5.9. No additional areas of abnormal hypermetabolism in the  abdomen or pelvis.  CT images show low attenuation throughout the liver with a probable  focal area of fat deposition near the porta hepatis.  SKELETON  No focal hypermetabolic activity to suggest skeletal metastasis.  IMPRESSION:  1. Hypermetabolic high left internal mammary, mediastinal and  retroperitoneal adenopathy, most consistent with metastatic disease.  2. Hypermetabolic pleural thickening in the posteromedial aspect of  the lower left hemi thorax, also most consistent metastatic disease.  3. Hepatic steatosis.  Electronically Signed  By: Lorin Picket M.D.  On: 01/10/2013 16:17   CT CHEST FINDINGS:  CT CHEST FINDINGS  Surgical clips are noted in the right axilla. No axillary  lymphadenopathy on either side. No supraclavicular lymphadenopathy.  Mediastinal lymphadenopathy has progressed in the interval. A  necrotic 17 mm short axis lymph node in the AP window (image 21 of  series 2  today) was 11 mm in short axis on the PET-CT and 13 mm in  short axis on the 12/20/2012 exam. 3.6 x 2.6 cm lymph node is  identified in the superior left hilum. There is no subcarinal  lymphadenopathy. No right hilar lymphadenopathy.  8 mm short axis left internal mammary lymph node is stable in the  interval. There is a new 5 mm anterior chest wall nodule on image  25. Irregular pleural disease in the posterior left costophrenic  sulcus has progressed clearly in the interval.  7 mm nodule along the left major fissure (image image 35) is  unchanged.  Bone windows reveal no worrisome lytic or sclerotic osseous lesions.  Impression: 53 yo woman with a history of recurrent metastatic breast cancer who has progressive mediastinal adenopathy. These nodes have progressed while on Tamoxifen. This raises the question whether they do in fact represent metastatic breast cancer or  whether another process such as lymphoma, sarcoid or, less likely, an infectious etiology is responsible.   The largest nodes and most accessible are the AP window nodes. These do not appear to be accessible with bronchoscopy or EBUS. If it did turn out to be lymphoma, it would not be possible to make the diagnosis with those modalities.  I think the best way to biopsy the nodes is with a Chamberlain procedure(left anterior mediastinotomy).  I reviewed the films with Mrs Birnbaum and her husband and explained the reasoning behind that. They understand the general nature of the procedure including the need for general anesthesia and the incision to be used. They understand the indications, risks, benefits and alternatives. They understand this is a diagnostic and not a therapeutic procedure. We discussed the risks including those related to general anesthesia, such as hypoxia or MI, and those directly related to the biopsy such as bleeding, infection, and pneumothorax. She accepts the risks and agrees to proceed  Plan:  Left  anterior mediastinotomy on Thursday 06/02/2013  We will plan to do this as an outpatient

## 2013-06-02 NOTE — Preoperative (Signed)
Beta Blockers   Reason not to administer Beta Blockers:Not Applicable 

## 2013-06-02 NOTE — Transfer of Care (Signed)
Immediate Anesthesia Transfer of Care Note  Patient: [name] [name]  Procedure(s) Performed: Procedure(s) with comments: MEDIASTINOTOMY CHAMBERLAIN MCNEIL (Left) - LEFT ANTERIOR MEDIASTINOTOMY   Patient Location: PACU  Anesthesia Type:General  Level of Consciousness: awake and alert   Airway & Oxygen Therapy: Patient Spontanous Breathing and Patient connected to nasal cannula oxygen  Post-op Assessment: Report given to PACU RN and Post -op Vital signs reviewed and stable  Post vital signs: Reviewed and stable  Complications: No apparent anesthesia complications

## 2013-06-02 NOTE — Interval H&P Note (Signed)
History and Physical Interval Note:  06/02/2013 8:00 AM  [name] [name]  has presented today for surgery, with the diagnosis of MEDIASTINAL ADENOPATHY  The various methods of treatment have been discussed with the patient and family. After consideration of risks, benefits and other options for treatment, the patient has consented to  Procedure(s) with comments: [place] (Left) - LEFT ANTERIOR MEDIASTINOTOMY  as a surgical intervention .  The patient's history has been reviewed, patient examined, no change in status, stable for surgery.  I have reviewed the patient's chart and labs.  Questions were answered to the patient's satisfaction.     HENDRICKSON,STEVEN C

## 2013-06-02 NOTE — Anesthesia Preprocedure Evaluation (Signed)
Anesthesia Evaluation  Patient identified by MRN, date of birth, ID band Patient awake    History of Anesthesia Complications (+) PONV  Airway Mallampati: II TM Distance: >3 FB Neck ROM: Full    Dental  (+) Dental Advisory Given   Pulmonary          Cardiovascular + Peripheral Vascular Disease  History of blood clot    Neuro/Psych Seizures -,     GI/Hepatic   Endo/Other    Renal/GU      Musculoskeletal   Abdominal   Peds  Hematology   Anesthesia Other Findings   Reproductive/Obstetrics                           Anesthesia Physical Anesthesia Plan  ASA: III  Anesthesia Plan: General   Post-op Pain Management:    Induction: Intravenous  Airway Management Planned: Oral ETT  Additional Equipment:   Intra-op Plan:   Post-operative Plan: Extubation in OR  Informed Consent:   Dental advisory given  Plan Discussed with:   Anesthesia Plan Comments:         Anesthesia Quick Evaluation

## 2013-06-03 ENCOUNTER — Telehealth: Payer: Self-pay | Admitting: Oncology

## 2013-06-03 ENCOUNTER — Other Ambulatory Visit: Payer: Self-pay | Admitting: Oncology

## 2013-06-03 ENCOUNTER — Encounter (HOSPITAL_COMMUNITY): Payer: Self-pay | Admitting: Thoracic Surgery (Cardiothoracic Vascular Surgery)

## 2013-06-03 NOTE — Op Note (Signed)
NAME:  YACINE, DROZ           ACCOUNT NO.:  [id number]  MEDICAL RECORD NO.:  [id number]  LOCATION:  [facility]                         FACILITY:  [facility]  PHYSICIAN:  Revonda Standard. [name], M.D.DATE OF BIRTH:  Apr 10, 1960  DATE OF PROCEDURE:  06/02/2013 DATE OF DISCHARGE:  06/02/2013                              OPERATIVE REPORT   PREOPERATIVE DIAGNOSIS:  Mediastinal adenopathy.  POSTOPERATIVE DIAGNOSIS:  Mediastinal adenopathy.  PROCEDURE:  Left anterior mediastinotomy Barbra Sarks procedure).  SURGEON:  Revonda Standard. [name], M.D.  ANESTHESIA:  General.  FINDINGS:  Multiple enlarged firm nodes. Frozen section revealed likely metastatic breast cancer.  CLINICAL NOTE:  Mrs. Umphlett is a 53 year old woman with a history of breast cancer.  She was first diagnosed in 2005.  She had been treated with surgery and then chemotherapy after recurrence in 2011. She had additional chemotherapy, followed by axillary dissection and postoperative radiation.  She currently is on tamoxifen.  She has had mediastinal adenopathy dating back over a year.  This has continued to slowly progress despite hormonal therapy.  Biopsy is necessary to determine if this is indeed metastatic breast cancer versus some other process such as lymphoma.  The patient was advised to have a left anterior mediastinotomy.  The operation was described in detail.  The indications, risks, benefits, and alternatives were reviewed.  She understood and accepted the risks and agreed to proceed.  OPERATIVE NOTE:  Mrs. Suddreth was brought to the operating room on June 02, 2013.  She had induction of general endotracheal anesthesia. The chest was prepped and draped in the usual sterile fashion.  An incision was made in the left anterior chest adjacent to the sternum over the third intercostal space, was carried through the skin and subcutaneous tissue.  The pectoralis fibers were separated.  The intercostal fibers were  divided the internal mammary vessels were identified and preserved.  The pleural space was entered.  The aortopulmonary window nodes were palpated.  The anterior pleura of the mediastinum was divided to allow access to the nodes.  First, a small node was accessible.  Multiple biopsies were taken and this was sent for frozen section.  While awaiting results of the frozen. A larger node was freed up from surrounding tissue using blunt dissection, and eventually the entire node was removed.  Some smaller fragments were removed prior to being able to remove the remainder of the node intact.  This was sent for permanent pathology.  Hemostasis was achieved with electrocautery.  The frozen section returned  with malignancy, likely metastatic breast cancer.  Final determination will await permanent pathology.  A final inspection was made for hemostasis.  The pectoralis fascia was closed with a running #1 Vicryl suture, the subcutaneous tissue, and skin were closed in standard fashion.  Dermabond was applied.  The patient was extubated in the operating room and taken to the postanesthetic care unit in good condition.     Revonda Standard [name], M.D.     SCH/MEDQ  D:  06/02/2013  T:  06/03/2013  Job:  [id number]

## 2013-06-03 NOTE — Telephone Encounter (Signed)
, °

## 2013-06-06 ENCOUNTER — Encounter: Payer: Self-pay | Admitting: Oncology

## 2013-06-06 ENCOUNTER — Ambulatory Visit (HOSPITAL_BASED_OUTPATIENT_CLINIC_OR_DEPARTMENT_OTHER): Payer: 59 | Admitting: Oncology

## 2013-06-06 ENCOUNTER — Telehealth: Payer: Self-pay | Admitting: Oncology

## 2013-06-06 ENCOUNTER — Other Ambulatory Visit: Payer: Self-pay | Admitting: *Deleted

## 2013-06-06 DIAGNOSIS — C7949 Secondary malignant neoplasm of other parts of nervous system: Secondary | ICD-10-CM

## 2013-06-06 DIAGNOSIS — Z170 Estrogen receptor positive status [ER+]: Secondary | ICD-10-CM

## 2013-06-06 DIAGNOSIS — C50919 Malignant neoplasm of unspecified site of unspecified female breast: Secondary | ICD-10-CM

## 2013-06-06 DIAGNOSIS — R590 Localized enlarged lymph nodes: Secondary | ICD-10-CM

## 2013-06-06 DIAGNOSIS — R599 Enlarged lymph nodes, unspecified: Secondary | ICD-10-CM

## 2013-06-06 DIAGNOSIS — C7931 Secondary malignant neoplasm of brain: Secondary | ICD-10-CM

## 2013-06-06 DIAGNOSIS — M8580 Other specified disorders of bone density and structure, unspecified site: Secondary | ICD-10-CM

## 2013-06-06 MED ORDER — LETROZOLE 2.5 MG PO TABS: 2.5000 mg | ORAL_TABLET | Freq: Every day | ORAL | Status: DC

## 2013-06-06 NOTE — Progress Notes (Signed)
[name] [name] [id number] February 11, 1961 53 y.o. 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 She subsequently had a local regional recurrence to the right axilla for which she was treated at [facility].  Past Medical History:  #1 breast cancer: status post lumpectomy with sentinel node biopsy 2005. Patient was then treated with adjuvant chemotherapy consisting of Adriamycin Cytoxan x4 cycles. She declined further treatment. She was then treated with adjuvant radiation therapy by Dr. [name] at [facility]. She thereafter declined adjuvant antiestrogen therapy with tamoxifen.  #2 patient subsequently developed local regional recurrence to the right axilla. She was seen at [facility] by Dr. [name]. Patient initially received neoadjuvant chemotherapy consisting of Taxotere. This was then followed by full axillary lymph node dissection and mastectomy. She was then recommended taking antiestrogen therapy. She tried several different agents including tamoxifen, Aromasin, arimidex, letrozole. However she could not tolerate them. And discontinued them.  #3 Patient transferred her care to the [place]. She was begun on adjuvant tamoxifen 20 mg daily. She has been tolerating it well for the last 1-2. We have been doing serial CT scans. Most recent CT revealed enlarging mediastinal lymph nodes. Patient was seen by Dr. [name]. She has undergone biopsy of the largest lymph node. The pathology verbally was consistent with recurrent breast cancer.  Current therapy: Switch to letrozole 2.5 mg daily 06/06/13  INTERVAL HISTORY:  Patient is seen in followup after her surgery. Unfortunately I do not have her final pathology results. But her verbal report the biopsy was consistent with recurrent breast cancer. We do not have prognostic markers. Patient and I and her  husband went over the results and the possible therapeutic intervention in detail. We discussed chemotherapy versus antiestrogen therapy. Patient does not want to do chemotherapy. She is open to doing some form of antiestrogen therapy. We discussed the aromatase inhibitors which she has received in the past. We discussed doing letrozole 2.5 mg daily. We discussed the risks benefits and side effects. Patient currently is very anxious and nervous. Today she denies any headaches double vision blurring of vision fevers chills night sweats. No shortness of breath chest pains palpitations. No abdominal pain no diarrhea or constipation. She has no easy bruising or bleeding. She has no myalgias and arthralgias. No peripheral paresthesias or gait disturbances. Remainder of the 10 point review of systems is negative.   Past Surgical History  Procedure Laterality Date  . Breast lumpectomy  2005  . Axillary lymph node dissection  Dec. 2011  . Portacath placement  12/11  . Removal portacath    . Mediastinotomy chamberlain mcneil Left 06/02/2013    Procedure: MEDIASTINOTOMY CHAMBERLAIN MCNEIL;  Surgeon: [name], MD;  Location: [place];  Service: Thoracic;  Laterality: Left;  LEFT ANTERIOR MEDIASTINOTOMY     Allergies: Allergies  Allergen Reactions  . Morphine And Related   . Tegaderm Ag Mesh [Silver]     Current Medications: Current Outpatient Prescriptions  Medication Sig Dispense Refill  . ALPRAZolam (XANAX) 0.5 MG tablet Take 1 tablet (0.5 mg total) by mouth at bedtime as needed for sleep.  30 tablet  1  . Aspirin-Acetaminophen-Caffeine (EXCEDRIN PO) Take 2 tablets by mouth daily as needed (pain).      Marland Kitchen b complex vitamins capsule Take 1 capsule by mouth daily.      . Calcium-Magnesium-Vitamin D (CITRACAL CALCIUM+D PO)  Take by mouth. 475m vitamin C, 5028mvitamin D3 2 PO DAILY      . folic acid (FOLVITE) 1 MG tablet Take 1 mg by mouth daily.      . Magnesium 100 MG CAPS Take 250  each by mouth daily. Taking 250 mg per day      . Melatonin 1 MG TABS Take 1 mg by mouth at bedtime as needed (sleep).       . Marland KitchenxyCODONE (OXY IR/ROXICODONE) 5 MG immediate release tablet Take 1-2 tablets (5-10 mg total) by mouth every 4 (four) hours as needed for moderate pain.  30 tablet  0  . tamoxifen (NOLVADEX) 20 MG tablet Take 1 tablet (20 mg total) by mouth daily.  30 tablet  4  . VITAMIN D, CHOLECALCIFEROL, PO Take 2,000 Units by mouth daily.        No current facility-administered medications for this visit.   REVIEW OF SYSTEMS: A 10 point review of systems was conducted and is otherwise negative except for what is noted above.     PHYSICAL EXAMINATION: There were no vitals taken for this visit. General: Patient is a well appearing female in no acute distress HEENT: PERRLA, sclerae anicteric no conjunctival pallor, MMM Neck: supple, no palpable adenopathy Lungs: clear to auscultation bilaterally, no wheezes, rhonchi, or rales Cardiovascular: regular rate rhythm, S1, S2, no murmurs, rubs or gallops Abdomen: Soft, non-tender, non-distended, normoactive bowel sounds, no HSM Extremities: warm and well perfused, no clubbing, cyanosis, or edema Skin: No rashes or lesions Neuro: Non-focal ECOG-1 Right breast: mastectomy site well healed, axillary scar healed, no nodularity, tender and slightly swollen, left breast, no masses or nodularity  LABS:    Chemistry      Component Value Date/Time   NA 135* 05/31/2013 1003   NA 136 05/16/2013 1139   K 4.4 05/31/2013 1003   K 4.2 05/16/2013 1139   CL 98 05/31/2013 1003   CL 105 05/06/2012 1333   CO2 20 05/31/2013 1003   CO2 22 05/16/2013 1139   BUN 14 05/31/2013 1003   BUN 11.7 05/16/2013 1139   CREATININE 0.71 05/31/2013 1003   CREATININE 0.8 05/16/2013 1139      Component Value Date/Time   CALCIUM 9.5 05/31/2013 1003   CALCIUM 10.5* 05/16/2013 1139   ALKPHOS 59 05/31/2013 1003   ALKPHOS 56 05/16/2013 1139   AST 41* 05/31/2013 1003   AST 51*  05/16/2013 1139   ALT 71* 05/31/2013 1003   ALT 87* 05/16/2013 1139   BILITOT 0.2* 05/31/2013 1003   BILITOT 0.26 05/16/2013 1139      Lab Results  Component Value Date   WBC 6.8 05/31/2013   HGB 13.4 05/31/2013   HCT 37.8 05/31/2013   MCV 93.1 05/31/2013   PLT 350 05/31/2013   CT CHEST, ABDOMEN, AND PELVIS WITH CONTRAST  TECHNIQUE: 05/16/13 Multidetector CT imaging of the chest, abdomen and pelvis was  performed following the standard protocol during bolus  administration of intravenous contrast.  CONTRAST: 10091mMNIPAQUE IOHEXOL 300 MG/ML SOLN  COMPARISON: PET-CT from 01/10/2013. Chest CT from 12/20/2012.  FINDINGS:  CT CHEST FINDINGS  Surgical clips are noted in the right axilla. No axillary  lymphadenopathy on either side. No supraclavicular lymphadenopathy.  Mediastinal lymphadenopathy has progressed in the interval. A  necrotic 17 mm short axis lymph node in the AP window (image 21 of  series 2 today) was 11 mm in short axis on the PET-CT and 13 mm in  short axis on the  12/20/2012 exam. 3.6 x 2.6 cm lymph node is  identified in the superior left hilum. There is no subcarinal  lymphadenopathy. No right hilar lymphadenopathy.  8 mm short axis left internal mammary lymph node is stable in the  interval. There is a new 5 mm anterior chest wall nodule on image  25. Irregular pleural disease in the posterior left costophrenic  sulcus has progressed clearly in the interval.  7 mm nodule along the left major fissure (image image 35) is  unchanged.  Bone windows reveal no worrisome lytic or sclerotic osseous lesions.  CT ABDOMEN AND PELVIS FINDINGS  The liver shows diffuse fatty change without focal mass lesion. The  spleen, stomach, duodenum, pancreas, gallbladder, and adrenal glands  are unremarkable. The kidneys are normal in appearance bilaterally.  Left para-aortic lymph node cranial to the left renal vein measures  12 mm in short axis on image 60. This is not substantially changed  in  the interval. No other evidence for retroperitoneal or upper  abdominal lymphadenopathy.  Imaging through the pelvis shows no free intraperitoneal fluid. No  pelvic sidewall lymphadenopathy. Uterus is unremarkable. There is no  adnexal mass. The terminal ileum is normal. The appendix is normal.  Bone windows reveal no worrisome lytic or sclerotic osseous lesions.  IMPRESSION:  1. Interval progression of metastatic disease in the mediastinum and  involving the inferior left pleura. And upper left internal mammary  lymph node is stable but there is a new probable internal mammary  lymph node in the inferior left chest.  2. Stable nodule along the left major fissure.  3. Stable the slight increase in size of the high left para-aortic  lymph node in the abdomen. Attention to this lymph node on followup  imaging recommended.  Electronically Signed  By: Misty Stanley M.D.  On: 05/16/2013 14:56      ASSESSMENT/PLAN:    52 year old female with   1. previous history of 1.7 cm ER positive breast cancer originally diagnosed in 2005. At that time patient underwent a lumpectomy with sentinel node biopsy followed by adjuvant chemotherapy consisting of 4 cycles of a.c. Thereafter she was offered endocrine therapy but she declined. She however did undergo radiation therapy. In 2011 patient had a local regional axillary recurrence that was diagnosed at [facility]. The core needle biopsy revealed a high-grade invasive ductal carcinoma that was ER positive HER-2/neu negative. Patient underwent neoadjuvant chemotherapy consisting of 3 cycles of Taxol and Cytoxan but discontinued about due to toxicity and intolerance to Taxol. She then went on to have right axillary lymph node dissection the final pathology revealed 3 of 8 lymph nodes positive for invasive cancer. She received local regional radiation therapy to the excellent as well as the supraclavicular region. Thereafter she was recommended antiestrogen  therapy with aromatase inhibitors. She was started on letrozole but could not tolerate it therefore after she was transitioned to Aromasin she could not tolerate that well either. In September 2012 she was transitioned to tamoxifen  #2 patient is status post biopsy of enlarging mediastinal lymph node. The biopsy results seem to be consistent with recurrent breast cancer. We discussed the results in detail. I do not have prognostic markers back yet. I will call her with the results of these. In the meantime I have recommended that she begin letrozole 2.5 mg on a daily basis. We also discussed other options including chemotherapy. However patient at this time does not want chemotherapy. Patient is willing to try electrosol now. We discussed  risks benefits and side effects of this in detail.  #3 patient will be seen back in about 2-3 months time in followup. I will call her with the results of her final pathology.    All questions were answered. The patient knows to call the clinic with any problems, questions or concerns. We can certainly see the patient much sooner if necessary.  I spent 30 minutes counseling the patient face to face. The total time spent in the appointment was 30 minutes.   Marcy Panning, MD Medical/Oncology [facility] [phone] (beeper) [phone] (Office)  06/06/2013, 8:49 AM

## 2013-06-06 NOTE — Patient Instructions (Signed)
Letrozole tablets What is this medicine? LETROZOLE (LET roe zole) blocks the production of estrogen. Certain types of breast cancer grow under the influence of estrogen. Letrozole helps block tumor growth. This medicine is used to treat advanced breast cancer in postmenopausal women. This medicine may be used for other purposes; ask your health care provider or pharmacist if you have questions. COMMON BRAND NAME(S): Femara What should I tell my health care provider before I take this medicine? They need to know if you have any of these conditions: -liver disease -osteoporosis (weak bones) -an unusual or allergic reaction to letrozole, other medicines, foods, dyes, or preservatives -pregnant or trying to get pregnant -breast-feeding How should I use this medicine? Take this medicine by mouth with a glass of water. You may take it with or without food. Follow the directions on the prescription label. Take your medicine at regular intervals. Do not take your medicine more often than directed. Do not stop taking except on your doctor's advice. Talk to your pediatrician regarding the use of this medicine in children. Special care may be needed. Overdosage: If you think you have taken too much of this medicine contact a poison control center or emergency room at once. NOTE: This medicine is only for you. Do not share this medicine with others. What if I miss a dose? If you miss a dose, take it as soon as you can. If it is almost time for your next dose, take only that dose. Do not take double or extra doses. What may interact with this medicine? Do not take this medicine with any of the following medications: -estrogens, like hormone replacement therapy or birth control pills This medicine may also interact with the following medications: -dietary supplements such as androstenedione or DHEA -prasterone -tamoxifen This list may not describe all possible interactions. Give your health care provider  a list of all the medicines, herbs, non-prescription drugs, or dietary supplements you use. Also tell them if you smoke, drink alcohol, or use illegal drugs. Some items may interact with your medicine. What should I watch for while using this medicine? Visit your doctor or health care professional for regular check-ups to monitor your condition. Do not use this drug if you are pregnant. Serious side effects to an unborn child are possible. Talk to your doctor or pharmacist for more information. You may get drowsy or dizzy. Do not drive, use machinery, or do anything that needs mental alertness until you know how this medicine affects you. Do not stand or sit up quickly, especially if you are an older patient. This reduces the risk of dizzy or fainting spells. What side effects may I notice from receiving this medicine? Side effects that you should report to your doctor or health care professional as soon as possible: -allergic reactions like skin rash, itching, or hives -bone fracture -chest pain -difficulty breathing or shortness of breath -severe pain, swelling, warmth in the leg -unusually weak or tired -vaginal bleeding Side effects that usually do not require medical attention (report to your doctor or health care professional if they continue or are bothersome): -bone, back, joint, or muscle pain -dizziness -fatigue -fluid retention -headache -hot flashes, night sweats -nausea -weight gain This list may not describe all possible side effects. Call your doctor for medical advice about side effects. You may report side effects to FDA at 1-800-FDA-1088. Where should I keep my medicine? Keep out of the reach of children. Store between 15 and 30 degrees C (59 and 86  degrees F). Throw away any unused medicine after the expiration date. NOTE: This sheet is a summary. It may not cover all possible information. If you have questions about this medicine, talk to your doctor, pharmacist, or  health care provider.  2014, Elsevier/Gold Standard. (2007-05-28 16:43:44)  Osteoporosis Throughout your life, your body breaks down old bone and replaces it with new bone. As you get older, your body does not replace bone as quickly as it breaks it down. By the age of 30 years, most people begin to gradually lose bone because of the imbalance between bone loss and replacement. Some people lose more bone than others. Bone loss beyond a specified normal degree is considered osteoporosis.  Osteoporosis affects the strength and durability of your bones. The inside of the ends of your bones and your flat bones, like the bones of your pelvis, look like honeycomb, filled with tiny open spaces. As bone loss occurs, your bones become less dense. This means that the open spaces inside your bones become bigger and the walls between these spaces become thinner. This makes your bones weaker. Bones of a person with osteoporosis can become so weak that they can break (fracture) during minor accidents, such as a simple fall. CAUSES  The following factors have been associated with the development of osteoporosis:  Smoking.  Drinking more than 2 alcoholic drinks several days per week.  Long-term use of certain medicines:  Corticosteroids.  Chemotherapy medicines.  Thyroid medicines.  Antiepileptic medicines.  Gonadal hormone suppression medicine.  Immunosuppression medicine.  Being underweight.  Lack of physical activity.  Lack of exposure to the sun. This can lead to vitamin D deficiency.  Certain medical conditions:  Certain inflammatory bowel diseases, such as Crohn disease and ulcerative colitis.  Diabetes.  Hyperthyroidism.  Hyperparathyroidism. RISK FACTORS Anyone can develop osteoporosis. However, the following factors can increase your risk of developing osteoporosis:  Gender Women are at higher risk than men.  Age Being older than 50 years increases your risk.  Ethnicity  White and Asian people have an increased risk.  Weight Being extremely underweight can increase your risk of osteoporosis.  Family history of osteoporosis Having a family member who has developed osteoporosis can increase your risk. SYMPTOMS  Usually, people with osteoporosis have no symptoms.  DIAGNOSIS  Signs during a physical exam that may prompt your caregiver to suspect osteoporosis include:  Decreased height. This is usually caused by the compression of the bones that form your spine (vertebrae) because they have weakened and become fractured.  A curving or rounding of the upper back (kyphosis). To confirm signs of osteoporosis, your caregiver may request a procedure that uses 2 low-dose X-ray beams with different levels of energy to measure your bone mineral density (dual-energy X-ray absorptiometry [DXA]). Also, your caregiver may check your level of vitamin D. TREATMENT  The goal of osteoporosis treatment is to strengthen bones in order to decrease the risk of bone fractures. There are different types of medicines available to help achieve this goal. Some of these medicines work by slowing the processes of bone loss. Some medicines work by increasing bone density. Treatment also involves making sure that your levels of calcium and vitamin D are adequate. PREVENTION  There are things you can do to help prevent osteoporosis. Adequate intake of calcium and vitamin D can help you achieve optimal bone mineral density. Regular exercise can also help, especially resistance and weight-bearing activities. If you smoke, quitting smoking is an important part of osteoporosis prevention.  MAKE SURE YOU:  Understand these instructions.  Will watch your condition.  Will get help right away if you are not doing well or get worse. FOR MORE INFORMATION www.osteo.org and Equipment[employer].com.ee Document Released: 12/25/2004 Document Revised: 07/12/2012 Document Reviewed: 03/01/2011 [facility] Patient Information  2014 [place], [place].  Alendronate tablets What is this medicine? ALENDRONATE (a LEN droe nate) slows calcium loss from bones. It helps to make normal healthy bone and to slow bone loss in people with Paget's disease and osteoporosis. It may be used in others at risk for bone loss. This medicine may be used for other purposes; ask your health care provider or pharmacist if you have questions. COMMON BRAND NAME(S): Fosamax What should I tell my health care provider before I take this medicine? They need to know if you have any of these conditions: -dental disease -esophagus, stomach, or intestine problems, like acid reflux or GERD -kidney disease -low blood calcium -low vitamin D -problems sitting or standing 30 minutes -trouble swallowing -an unusual or allergic reaction to alendronate, other medicines, foods, dyes, or preservatives -pregnant or trying to get pregnant -breast-feeding How should I use this medicine? You must take this medicine exactly as directed or you will lower the amount of the medicine you absorb into your body or you may cause yourself harm. Take this medicine by mouth first thing in the morning, after you are up for the day. Do not eat or drink anything before you take your medicine. Swallow the tablet with a full glass (6 to 8 fluid ounces) of plain water. Do not take this medicine with any other drink. Do not chew or crush the tablet. After taking this medicine, do not eat breakfast, drink, or take any medicines or vitamins for at least 30 minutes. Sit or stand up for at least 30 minutes after you take this medicine; do not lie down. Do not take your medicine more often than directed. Talk to your pediatrician regarding the use of this medicine in children. Special care may be needed. Overdosage: If you think you have taken too much of this medicine contact a poison control center or emergency room at once. NOTE: This medicine is only for you. Do not share this medicine  with others. What if I miss a dose? If you miss a dose, do not take it later in the day. Continue your normal schedule starting the next morning. Do not take double or extra doses. What may interact with this medicine? -aluminum hydroxide -antacids -aspirin -calcium supplements -drugs for inflammation like ibuprofen, naproxen, and others -iron supplements -magnesium supplements -vitamins with minerals This list may not describe all possible interactions. Give your health care provider a list of all the medicines, herbs, non-prescription drugs, or dietary supplements you use. Also tell them if you smoke, drink alcohol, or use illegal drugs. Some items may interact with your medicine. What should I watch for while using this medicine? Visit your doctor or health care professional for regular checks ups. It may be some time before you see benefit from this medicine. Do not stop taking your medicine except on your doctor's advice. Your doctor or health care professional may order blood tests and other tests to see how you are doing. You should make sure you get enough calcium and vitamin D while you are taking this medicine, unless your doctor tells you not to. Discuss the foods you eat and the vitamins you take with your health care professional. Some people who take this medicine have  severe bone, joint, and/or muscle pain. This medicine may also increase your risk for a broken thigh bone. Tell your doctor right away if you have pain in your upper leg or groin. Tell your doctor if you have any pain that does not go away or that gets worse. This medicine can make you more sensitive to the sun. If you get a rash while taking this medicine, sunlight may cause the rash to get worse. Keep out of the sun. If you cannot avoid being in the sun, wear protective clothing and use sunscreen. Do not use sun lamps or tanning beds/booths. What side effects may I notice from receiving this medicine? Side effects  that you should report to your doctor or health care professional as soon as possible: -allergic reactions like skin rash, itching or hives, swelling of the face, lips, or tongue -black or tarry stools -bone, muscle or joint pain -changes in vision -chest pain -heartburn or stomach pain -jaw pain, especially after dental work -pain or trouble when swallowing -redness, blistering, peeling or loosening of the skin, including inside the mouth Side effects that usually do not require medical attention (report to your doctor or health care professional if they continue or are bothersome): -changes in taste -diarrhea or constipation -eye pain or itching -headache -nausea or vomiting -stomach gas or fullness This list may not describe all possible side effects. Call your doctor for medical advice about side effects. You may report side effects to FDA at 1-800-FDA-1088. Where should I keep my medicine? Keep out of the reach of children. Store at room temperature of 15 and 30 degrees C (59 and 86 degrees F). Throw away any unused medicine after the expiration date. NOTE: This sheet is a summary. It may not cover all possible information. If you have questions about this medicine, talk to your doctor, pharmacist, or health care provider.  2014, Elsevier/Gold Standard. (2010-09-13 08:56:09)

## 2013-06-06 NOTE — Telephone Encounter (Signed)
, °

## 2013-06-07 ENCOUNTER — Ambulatory Visit (INDEPENDENT_AMBULATORY_CARE_PROVIDER_SITE_OTHER): Payer: 59 | Admitting: Thoracic Surgery (Cardiothoracic Vascular Surgery)

## 2013-06-07 ENCOUNTER — Telehealth: Payer: Self-pay | Admitting: *Deleted

## 2013-06-07 VITALS — BP 145/100 | HR 90 | Resp 16 | Ht 63.0 in | Wt 180.0 lb

## 2013-06-07 DIAGNOSIS — C50919 Malignant neoplasm of unspecified site of unspecified female breast: Secondary | ICD-10-CM

## 2013-06-07 DIAGNOSIS — C801 Malignant (primary) neoplasm, unspecified: Secondary | ICD-10-CM

## 2013-06-07 DIAGNOSIS — R591 Generalized enlarged lymph nodes: Secondary | ICD-10-CM

## 2013-06-07 DIAGNOSIS — R599 Enlarged lymph nodes, unspecified: Secondary | ICD-10-CM

## 2013-06-07 DIAGNOSIS — Z09 Encounter for follow-up examination after completed treatment for conditions other than malignant neoplasm: Secondary | ICD-10-CM

## 2013-06-07 DIAGNOSIS — C799 Secondary malignant neoplasm of unspecified site: Secondary | ICD-10-CM

## 2013-06-07 NOTE — Progress Notes (Signed)
  HPI:  Mrs. Hiltner returns today for postoperative followup visit.  She is a 53 year old woman with a history of stage IV breast cancer who had a style adenopathy that was progressing on tamoxifen. We did a Chamberlain procedure on her on March 5. Tissue on the day of surgery. She says she had some pain in the first couple of days but that is pretty much resolved. She is anxious to resume her full activities. She's not using any narcotics.  Past Medical History  Diagnosis Date  . Seizures 2010    Isolated incident.  . Breast cancer dx'd 2005/2011  . PONV (postoperative nausea and vomiting)   . Peripheral vascular disease 02/2010    blood clot related to porta cath     Current Outpatient Prescriptions  Medication Sig Dispense Refill  . ALPRAZolam (XANAX) 0.5 MG tablet Take 1 tablet (0.5 mg total) by mouth at bedtime as needed for sleep.  30 tablet  1  . Aspirin-Acetaminophen-Caffeine (EXCEDRIN PO) Take 2 tablets by mouth daily as needed (pain).      Marland Kitchen b complex vitamins capsule Take 1 capsule by mouth daily.      . Calcium-Magnesium-Vitamin D (CITRACAL CALCIUM+D PO) Take by mouth. 400mg  vitamin C, 500mg  vitamin D3 2 PO DAILY      . folic acid (FOLVITE) 1 MG tablet Take 1 mg by mouth daily.      Marland Kitchen letrozole (FEMARA) 2.5 MG tablet Take 1 tablet (2.5 mg total) by mouth daily.  30 tablet  6  . Magnesium 100 MG CAPS Take 250 each by mouth daily. Taking 250 mg per day      . Melatonin 1 MG TABS Take 1 mg by mouth at bedtime as needed (sleep).       Marland Kitchen oxyCODONE (OXY IR/ROXICODONE) 5 MG immediate release tablet Take 1-2 tablets (5-10 mg total) by mouth every 4 (four) hours as needed for moderate pain.  30 tablet  0  . VITAMIN D, CHOLECALCIFEROL, PO Take 2,000 Units by mouth daily.        No current facility-administered medications for this visit.    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 She's doing well with no significant discomfort. She's not requiring any pain medication. She may resume full activities. I cautioned her about not submerging the incision and water for a couple of weeks, but other than at her to these are unrestricted. She may begin driving.   Plan: I will be happy to see Mrs. Gatley back any time if I can be of any further assistance with her care

## 2013-06-07 NOTE — Telephone Encounter (Signed)
Received phone call from patient that she picked up the prescription for Femara. Patient asked,"is it OK with Dr. Humphrey Rolls if I wait a week and heal from this surgery before I start taking Femara?" Per Dr. Humphrey Rolls, it's OK to wait a week to start the Femara. Patient verbalized understanding.

## 2013-06-08 ENCOUNTER — Ambulatory Visit: Payer: 59 | Attending: Oncology | Admitting: Physical Therapy

## 2013-06-08 DIAGNOSIS — IMO0001 Reserved for inherently not codable concepts without codable children: Secondary | ICD-10-CM | POA: Insufficient documentation

## 2013-06-08 DIAGNOSIS — I890 Lymphedema, not elsewhere classified: Secondary | ICD-10-CM | POA: Insufficient documentation

## 2013-06-10 ENCOUNTER — Telehealth: Payer: Self-pay

## 2013-06-10 ENCOUNTER — Other Ambulatory Visit: Payer: Self-pay | Admitting: Oncology

## 2013-06-10 NOTE — Telephone Encounter (Signed)
Pt called [facility] requesting pathology results.  Routed to [place].

## 2013-06-10 NOTE — Telephone Encounter (Signed)
Per below, patient notified of pathology results.  Pt requested they be mailed - done.      Call Documentation      Deatra Robinson, MD at 06/10/2013  4:06 PM      Status: Signed            Please give her the results   You can fax them to her as well   Pathology consistent with Breast cancer ER+ PR + Her2Neu-(negative)   No changes in therapy   Thanks KK         Prentiss Bells, RN at 06/10/2013  3:47 PM      Status: Signed            Pt called [facility] requesting pathology results.  Routed to [place].

## 2013-06-10 NOTE — Telephone Encounter (Signed)
Please give her the results  You can fax them to her as well  Pathology consistent with Breast cancer ER+ PR + Her2Neu-(negative)  No changes in therapy  Thanks KK

## 2013-06-14 ENCOUNTER — Telehealth: Payer: Self-pay | Admitting: *Deleted

## 2013-06-14 NOTE — Telephone Encounter (Signed)
Received voice mail from patient regarding Femara. Patient stated, "I am going to start Femara this Friday evening when my husband gets home. My past reaction with Femara is that I had an outer body feeling/ experience. I want to know what side effects are normal versus what are abnormal, so I would know when to go to the ER. Also, I only want to be on this drug for a month or two. Is there some test to see if the drug is working?" Return phone number is [phone].

## 2013-06-15 NOTE — Telephone Encounter (Signed)
Spoke to patient and let her know that she should start  the Femara. We also went over the side effects again. I recommended if she has any  unusual signs or symptoms that she is to report to the emergency room. we discussed her pathology. Patient is gong to start the Femara on Friday while her husband is at home to monitor her. We discussed doing CT scans in 3 months time from the start of the Femara.

## 2013-06-20 ENCOUNTER — Ambulatory Visit
Admission: RE | Admit: 2013-06-20 | Discharge: 2013-06-20 | Disposition: A | Discharge status: Home / Self Care | Payer: 59 | Source: Ambulatory Visit | Attending: Oncology | Admitting: Oncology

## 2013-06-20 DIAGNOSIS — M8580 Other specified disorders of bone density and structure, unspecified site: Secondary | ICD-10-CM

## 2013-06-22 ENCOUNTER — Ambulatory Visit: Payer: 59 | Admitting: Physical Therapy

## 2013-06-24 ENCOUNTER — Telehealth: Payer: Self-pay | Admitting: *Deleted

## 2013-06-24 NOTE — Telephone Encounter (Signed)
As noted below by Dr. Humphrey Rolls, I informed patient that her bond density showed low bone mass. Patient is already taking Calcium with Vitamin D in it. Patient verbalized understanding to continue taking this medication.

## 2013-06-24 NOTE — Telephone Encounter (Signed)
Message copied by Hebert Soho on Fri Jun 24, 2013  1:38 PM ------      Message from: Deatra Robinson      Created: Wed Jun 22, 2013  3:04 PM       Bone density: low bone mass, take calcium and vitamin D 2000 iu daily ------

## 2013-06-26 ENCOUNTER — Encounter: Payer: Self-pay | Admitting: Oncology

## 2013-06-27 NOTE — Progress Notes (Signed)
Report rcvd from [facility] 06/20/13.  Provided to [place].

## 2013-06-28 NOTE — Telephone Encounter (Signed)
   Provider input needed: "Bowel problems, bright red blood in stools, change in bowel pattern"   Reason for call: MyChart message cent on 06-26-2013 reporting bowel problems.  "I don't know what to do.  I have to take the Femara but I'm back to where I was before stopping the tamoxifen."  Gastrointestinal: positive for change in bowel habits, constipation and small amount hematochezia   ALLERGIES:  is allergic to morphine and related and tegaderm ag mesh.  Patient last received chemotherapy/ treatment on N/A, taking femara for two weeks as of 06-06-2013  Patient was last seen in the office on 06-06-2013  Next appt is 07-11-2013  Is patient having fevers greater than 100.5?  no   Is patient having uncontrolled pain, or new pain? no   Is patient having new back pain that changes with position (worsens or eases when laying down?)  no   Is patient able to eat and drink? yes, "This is not a diet problem, this is a drug problem.  I've been discussing this with Dr. Humphrey Rolls for a year and a half now."  Is patient able to pass stool without difficulty?   no, 'I missed my bowel movement yesterday and today it was a little harder initially, then it was bloody."     Is patient having uncontrolled nausea?  no    patient calls 06/28/2013 with complaint of  Gastrointestinal: positive for change in bowel habits, constipation and bright red blood on a square of tissue, then I dab and see a little spot.  It's internal hemorrhoids that have developed from the femara.  I'm back to this old issue again, periodically bleeding.  I'm sore down there.  I can't have this treated because it will leave scar tissue.  Stool softeners will make me have trouble emptying om my own.  I am used to going every day and now I skip days going every other day.  I have to take the femara but the bleeding is concerning.  I drink lots of water"     Summary Based on the above information advised patient to  Continue drinking water.  A  nurse will call with any instructions or orders after this information is shared with providers.  Patient may be reached at [phone].   [name] [name], [name] [name]  06/28/2013, 11:45 AM   Background Info  [name]   DOB: 13-Nov-1960   MR#: [id number]   CSN#   [id number] 06/28/2013

## 2013-07-01 ENCOUNTER — Telehealth: Payer: Self-pay

## 2013-07-01 NOTE — Telephone Encounter (Signed)
Pt reports bright red blood Sunday and Tuesday, however since then BMs have improved - almost no blood today.  Sunday and Thursday was more blood than she has ssen before and she felt she needed to report it to MD.  Reports she gets plenty of exercise and drinks adequate water.  Asked for suggestion - I recommended increase dietary fiber if she is averse to stool softeners.  Pt believes source is internal hemorrhoid.  Advised her to keep stools soft for little straining to allow hemorrhoid to heal.  Repair of hemorrhoid would require GI MD.  Pt inquired about powders for liquid and I let her know they are just fiber and may help in this situation.  Pt voiced understanding and had no further questions.

## 2013-07-04 ENCOUNTER — Telehealth: Payer: Self-pay | Admitting: *Deleted

## 2013-07-04 NOTE — Telephone Encounter (Signed)
See 07-01-2013 collaborative nurse phone note about patients bowels.

## 2013-07-06 ENCOUNTER — Ambulatory Visit: Payer: 59 | Attending: Oncology | Admitting: Physical Therapy

## 2013-07-06 DIAGNOSIS — I890 Lymphedema, not elsewhere classified: Secondary | ICD-10-CM | POA: Insufficient documentation

## 2013-07-06 DIAGNOSIS — IMO0001 Reserved for inherently not codable concepts without codable children: Secondary | ICD-10-CM | POA: Insufficient documentation

## 2013-07-11 ENCOUNTER — Encounter: Payer: 59 | Admitting: Oncology

## 2013-07-11 ENCOUNTER — Other Ambulatory Visit (HOSPITAL_BASED_OUTPATIENT_CLINIC_OR_DEPARTMENT_OTHER): Payer: 59

## 2013-07-11 DIAGNOSIS — C773 Secondary and unspecified malignant neoplasm of axilla and upper limb lymph nodes: Secondary | ICD-10-CM

## 2013-07-11 DIAGNOSIS — C50919 Malignant neoplasm of unspecified site of unspecified female breast: Secondary | ICD-10-CM

## 2013-07-11 LAB — COMPREHENSIVE METABOLIC PANEL (CC13)
ALT: 66 U/L — AB (ref 0–55)
AST: 40 U/L — ABNORMAL HIGH (ref 5–34)
Albumin: 3.9 g/dL (ref 3.5–5.0)
Alkaline Phosphatase: 78 U/L (ref 40–150)
Anion Gap: 11 mEq/L (ref 3–11)
BUN: 12.2 mg/dL (ref 7.0–26.0)
CALCIUM: 9.8 mg/dL (ref 8.4–10.4)
CHLORIDE: 101 meq/L (ref 98–109)
CO2: 25 mEq/L (ref 22–29)
CREATININE: 0.9 mg/dL (ref 0.6–1.1)
GLUCOSE: 107 mg/dL (ref 70–140)
Potassium: 4.8 mEq/L (ref 3.5–5.1)
Sodium: 137 mEq/L (ref 136–145)
Total Bilirubin: 0.39 mg/dL (ref 0.20–1.20)
Total Protein: 7.5 g/dL (ref 6.4–8.3)

## 2013-07-11 LAB — CBC WITH DIFFERENTIAL/PLATELET
BASO%: 0.6 % (ref 0.0–2.0)
BASOS ABS: 0 10*3/uL (ref 0.0–0.1)
EOS%: 1.6 % (ref 0.0–7.0)
Eosinophils Absolute: 0.1 10*3/uL (ref 0.0–0.5)
HEMATOCRIT: 40.6 % (ref 34.8–46.6)
HEMOGLOBIN: 13.8 g/dL (ref 11.6–15.9)
LYMPH#: 1.4 10*3/uL (ref 0.9–3.3)
LYMPH%: 21.1 % (ref 14.0–49.7)
MCH: 32.1 pg (ref 25.1–34.0)
MCHC: 33.9 g/dL (ref 31.5–36.0)
MCV: 94.8 fL (ref 79.5–101.0)
MONO#: 0.5 10*3/uL (ref 0.1–0.9)
MONO%: 7.6 % (ref 0.0–14.0)
NEUT%: 69.1 % (ref 38.4–76.8)
NEUTROS ABS: 4.6 10*3/uL (ref 1.5–6.5)
PLATELETS: 325 10*3/uL (ref 145–400)
RBC: 4.28 10*6/uL (ref 3.70–5.45)
RDW: 12.9 % (ref 11.2–14.5)
WBC: 6.6 10*3/uL (ref 3.9–10.3)

## 2013-07-12 ENCOUNTER — Other Ambulatory Visit: Payer: Self-pay

## 2013-07-12 ENCOUNTER — Telehealth: Payer: Self-pay | Admitting: Oncology

## 2013-07-12 DIAGNOSIS — C50919 Malignant neoplasm of unspecified site of unspecified female breast: Secondary | ICD-10-CM

## 2013-07-12 NOTE — Progress Notes (Signed)
Per KK - ok for pt to have CT in June and seen in July - pt requests July 2 if possible.  Per KK - Let pt know her liver function is stable and KK will continue to monitor.  Pt voiced understanding.

## 2013-07-12 NOTE — Progress Notes (Signed)
Per pt request, CMET results from 4/13 sent to her via [place].  Emanuel notifying patient.

## 2013-07-12 NOTE — Telephone Encounter (Signed)
add to previous note. central will call pt w/pet scan appt - pt aware.

## 2013-07-12 NOTE — Telephone Encounter (Signed)
s/w pt re d/u appt for 7/6. pt requested 7/2 but KK has no availability 7/2. central will call pt re ct

## 2013-07-13 NOTE — Progress Notes (Signed)
This encounter was created in error - please disregard.

## 2013-07-20 ENCOUNTER — Ambulatory Visit: Payer: 59 | Admitting: Physical Therapy

## 2013-07-28 ENCOUNTER — Telehealth: Payer: Self-pay

## 2013-07-28 NOTE — Telephone Encounter (Signed)
Pt c/o pain over past 3 weeks in left lower back and side.  Occurs 3-4 days/week, consistent ache.  Has tried heat, cold, excedrin.   Waking her at night.  "can hardly walk in am with pain and neuropathy."  Thinks it may be s/e of Femara.  Pt also wants a prescription for something to help her hair grow as Femara is causing her hair to thin.    Advised patient KK on LOA.  Pt does not want LC - is "not comfortable with a nurse practitioner dealing with these issues, wants an oncologist to review the issues"  Written up and provided to Dr. Jana Hakim.

## 2013-07-29 ENCOUNTER — Telehealth: Payer: Self-pay

## 2013-07-29 NOTE — Telephone Encounter (Signed)
Per pt - she has discussed results with Dr. Humphrey Rolls.

## 2013-08-03 ENCOUNTER — Ambulatory Visit: Payer: 59 | Attending: Oncology | Admitting: Physical Therapy

## 2013-08-03 DIAGNOSIS — IMO0001 Reserved for inherently not codable concepts without codable children: Secondary | ICD-10-CM | POA: Insufficient documentation

## 2013-08-03 DIAGNOSIS — I890 Lymphedema, not elsewhere classified: Secondary | ICD-10-CM | POA: Insufficient documentation

## 2013-08-04 ENCOUNTER — Ambulatory Visit (HOSPITAL_BASED_OUTPATIENT_CLINIC_OR_DEPARTMENT_OTHER): Payer: 59

## 2013-08-04 ENCOUNTER — Other Ambulatory Visit: Payer: Self-pay | Admitting: Oncology

## 2013-08-04 ENCOUNTER — Other Ambulatory Visit: Payer: Self-pay | Admitting: *Deleted

## 2013-08-04 ENCOUNTER — Telehealth: Payer: Self-pay | Admitting: Oncology

## 2013-08-04 ENCOUNTER — Ambulatory Visit (HOSPITAL_COMMUNITY)
Admission: RE | Admit: 2013-08-04 | Discharge: 2013-08-04 | Disposition: A | Discharge status: Home / Self Care | Payer: 59 | Source: Ambulatory Visit | Attending: Oncology | Admitting: Oncology

## 2013-08-04 DIAGNOSIS — C50919 Malignant neoplasm of unspecified site of unspecified female breast: Secondary | ICD-10-CM

## 2013-08-04 DIAGNOSIS — Z853 Personal history of malignant neoplasm of breast: Secondary | ICD-10-CM | POA: Insufficient documentation

## 2013-08-04 DIAGNOSIS — J90 Pleural effusion, not elsewhere classified: Secondary | ICD-10-CM | POA: Insufficient documentation

## 2013-08-04 DIAGNOSIS — M47814 Spondylosis without myelopathy or radiculopathy, thoracic region: Secondary | ICD-10-CM | POA: Insufficient documentation

## 2013-08-04 DIAGNOSIS — M546 Pain in thoracic spine: Secondary | ICD-10-CM | POA: Insufficient documentation

## 2013-08-04 LAB — CBC WITH DIFFERENTIAL/PLATELET
BASO%: 0.5 % (ref 0.0–2.0)
BASOS ABS: 0 10*3/uL (ref 0.0–0.1)
EOS%: 1.7 % (ref 0.0–7.0)
Eosinophils Absolute: 0.1 10*3/uL (ref 0.0–0.5)
HEMATOCRIT: 39.6 % (ref 34.8–46.6)
HEMOGLOBIN: 13.3 g/dL (ref 11.6–15.9)
LYMPH%: 19.8 % (ref 14.0–49.7)
MCH: 31.8 pg (ref 25.1–34.0)
MCHC: 33.6 g/dL (ref 31.5–36.0)
MCV: 94.8 fL (ref 79.5–101.0)
MONO#: 0.5 10*3/uL (ref 0.1–0.9)
MONO%: 7.5 % (ref 0.0–14.0)
NEUT#: 4.9 10*3/uL (ref 1.5–6.5)
NEUT%: 70.5 % (ref 38.4–76.8)
Platelets: 355 10*3/uL (ref 145–400)
RBC: 4.18 10*6/uL (ref 3.70–5.45)
RDW: 13.2 % (ref 11.2–14.5)
WBC: 7 10*3/uL (ref 3.9–10.3)
lymph#: 1.4 10*3/uL (ref 0.9–3.3)

## 2013-08-04 LAB — COMPREHENSIVE METABOLIC PANEL (CC13)
ALT: 81 U/L — AB (ref 0–55)
AST: 41 U/L — AB (ref 5–34)
Albumin: 3.8 g/dL (ref 3.5–5.0)
Alkaline Phosphatase: 81 U/L (ref 40–150)
Anion Gap: 11 mEq/L (ref 3–11)
BILIRUBIN TOTAL: 0.28 mg/dL (ref 0.20–1.20)
BUN: 11.8 mg/dL (ref 7.0–26.0)
CO2: 25 mEq/L (ref 22–29)
CREATININE: 0.9 mg/dL (ref 0.6–1.1)
Calcium: 10.1 mg/dL (ref 8.4–10.4)
Chloride: 103 mEq/L (ref 98–109)
Glucose: 100 mg/dl (ref 70–140)
Potassium: 3.7 mEq/L (ref 3.5–5.1)
SODIUM: 139 meq/L (ref 136–145)
TOTAL PROTEIN: 7.2 g/dL (ref 6.4–8.3)

## 2013-08-04 NOTE — Telephone Encounter (Signed)
, °

## 2013-08-04 NOTE — Progress Notes (Unsigned)
The patient called complaining of pain in her left side, wrapping around to the front, not rated to the right side. It's like in the rib cage, she says. In addition to that problem, which has been going on for about 3 weeks, she has symptoms related to the letrozole including hair loss, nausea, insomnia, and fatigue. She has some of these symptoms when taking tamoxifen remotely. She also has right-sided lymphedema for which is receiving physical therapy. She has a little bit of a cough, which she attributes to pollen although she tells me she has no history of allergies. She has nausea, decreased appetite. She has taken 14 from an oxycodone for the pain and some Excedrin, with very little success.  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 I am also setting her up for a PET scan which is to let us look at her lungs and bones as well as there was a new baseline as we consider changing treatment. If she cannot tolerate the letrozole we will likely go for fulvestrant plus or minus  Ibrance and to discuss that I have made her a return appointment with me for May 22.  She has a good understanding of the above. She is also concerned about IV access problems when she gets the pets. I have asked her to stop your 30 minutes before and we can place on IV here since she is a "heart stick".

## 2013-08-05 ENCOUNTER — Telehealth: Payer: Self-pay | Admitting: Oncology

## 2013-08-05 NOTE — Telephone Encounter (Signed)
, °

## 2013-08-08 ENCOUNTER — Other Ambulatory Visit: Payer: Self-pay | Admitting: Oncology

## 2013-08-09 ENCOUNTER — Telehealth: Payer: Self-pay | Admitting: *Deleted

## 2013-08-09 ENCOUNTER — Other Ambulatory Visit: Payer: Self-pay | Admitting: *Deleted

## 2013-08-09 NOTE — Telephone Encounter (Signed)
This RN was asked to call patient regarding an IV start for PET scan on 08/16/13. I left a voice message on cell phone asking patient to arrive at [facility] at 7:30 am so, the nurses could start an IV before 8:00 am PET scan. Please call if she had any further questions.

## 2013-08-10 ENCOUNTER — Other Ambulatory Visit: Payer: Self-pay | Admitting: Oncology

## 2013-08-12 ENCOUNTER — Telehealth: Payer: Self-pay | Admitting: *Deleted

## 2013-08-12 NOTE — Telephone Encounter (Signed)
Patient called and left a voicemail that she is having overall uncontolled generalized pain, she contributes partially to [employer]. She questions if she can take a break until she comes to office visit on 08/19/13. Attempted to call patient back, but no answer, left voicemail that Femara can contribute to her pain, and that she may stop until she has appt with Dr Jana Hakim on Friday. Encouraged to call back if further questions.

## 2013-08-16 ENCOUNTER — Ambulatory Visit (HOSPITAL_COMMUNITY)
Admission: RE | Admit: 2013-08-16 | Discharge: 2013-08-16 | Disposition: A | Discharge status: Home / Self Care | Payer: 59 | Source: Ambulatory Visit | Attending: Oncology | Admitting: Oncology

## 2013-08-16 ENCOUNTER — Other Ambulatory Visit: Payer: Self-pay | Admitting: Nurse Practitioner

## 2013-08-16 ENCOUNTER — Ambulatory Visit (HOSPITAL_COMMUNITY)
Admission: RE | Admit: 2013-08-16 | Discharge: 2013-08-16 | Disposition: A | Discharge status: Home / Self Care | Payer: 59 | Source: Ambulatory Visit | Attending: Interventional Radiology | Admitting: Interventional Radiology

## 2013-08-16 ENCOUNTER — Other Ambulatory Visit: Payer: Self-pay | Admitting: Oncology

## 2013-08-16 ENCOUNTER — Telehealth: Payer: Self-pay | Admitting: *Deleted

## 2013-08-16 ENCOUNTER — Encounter (HOSPITAL_COMMUNITY): Payer: Self-pay

## 2013-08-16 ENCOUNTER — Ambulatory Visit: Payer: 59

## 2013-08-16 ENCOUNTER — Other Ambulatory Visit: Payer: Self-pay | Admitting: *Deleted

## 2013-08-16 DIAGNOSIS — C50919 Malignant neoplasm of unspecified site of unspecified female breast: Secondary | ICD-10-CM | POA: Insufficient documentation

## 2013-08-16 DIAGNOSIS — R059 Cough, unspecified: Secondary | ICD-10-CM | POA: Insufficient documentation

## 2013-08-16 DIAGNOSIS — R599 Enlarged lymph nodes, unspecified: Secondary | ICD-10-CM | POA: Insufficient documentation

## 2013-08-16 DIAGNOSIS — J910 Malignant pleural effusion: Secondary | ICD-10-CM | POA: Insufficient documentation

## 2013-08-16 DIAGNOSIS — C7952 Secondary malignant neoplasm of bone marrow: Secondary | ICD-10-CM

## 2013-08-16 DIAGNOSIS — M4120 Other idiopathic scoliosis, site unspecified: Secondary | ICD-10-CM | POA: Insufficient documentation

## 2013-08-16 DIAGNOSIS — C7951 Secondary malignant neoplasm of bone: Secondary | ICD-10-CM | POA: Insufficient documentation

## 2013-08-16 DIAGNOSIS — K7689 Other specified diseases of liver: Secondary | ICD-10-CM | POA: Insufficient documentation

## 2013-08-16 DIAGNOSIS — R05 Cough: Secondary | ICD-10-CM | POA: Insufficient documentation

## 2013-08-16 LAB — GLUCOSE, CAPILLARY: Glucose-Capillary: 90 mg/dL (ref 70–99)

## 2013-08-16 MED ORDER — ALPRAZOLAM 0.25 MG PO TABS
0.2500 mg | ORAL_TABLET | Freq: Once | ORAL | Status: AC
  Administered 2013-08-16: 0.25 mg via ORAL
  Filled 2013-08-16: qty 1

## 2013-08-16 MED ORDER — FLUDEOXYGLUCOSE F - 18 (FDG) INJECTION
8.7000 | Freq: Once | INTRAVENOUS | Status: AC | PRN
  Administered 2013-08-16: 8.7 via INTRAVENOUS

## 2013-08-16 MED ORDER — ALPRAZOLAM 0.5 MG PO TABS: 0.2500 mg | ORAL_TABLET | Freq: Once | ORAL | Status: DC

## 2013-08-16 NOTE — Progress Notes (Unsigned)
The patient had a PET scan today which showed extensive left pleural disease with hypermetabolic prevascular and upper abdominal retroperitoneal adenopathy, as well as a bone lesion in the left medial clavicle. Aside from the pleural disease, there were no parenchymal lung masses and no liver involvement. There was a very large left effusion, with shift of the mediastinum to the right.  Because of the possibility of "tension hydrothorax" raised by radiology, we brought the patient back for left thoracentesis. 1.25 L were withdrawn. Postprocedure chest x-ray showed only a small left effusion remaining and a small loculated pneumothorax of the left lung base.  I asked [name] [name] to come by the office just to make sure she was doing well. She tells me she is breathing better. She still has a cough, which is nonproductive. He denies pain at present in the left lung area. On auscultation she was moving air and all quadrants.  She is scheduled to see me 08/19/2013 for a [name] [name] assessment of her overall situation and discussion of treatment plans.

## 2013-08-16 NOTE — Procedures (Signed)
Interventional Radiology Procedure Note  Procedure: US guided LEFT thoracentesis.  1.25 L aspirated Complications: None Recommendations: - CXR  Signed,  Criselda Peaches, MD [facility]

## 2013-08-16 NOTE — Telephone Encounter (Signed)
Patient called requesting Xanax for procedure today, she forgot to bring it with her. Will wait for patient to get to Ultrasound to determine what is needed.

## 2013-08-18 ENCOUNTER — Telehealth: Payer: Self-pay | Admitting: *Deleted

## 2013-08-18 DIAGNOSIS — C50919 Malignant neoplasm of unspecified site of unspecified female breast: Secondary | ICD-10-CM | POA: Insufficient documentation

## 2013-08-18 DIAGNOSIS — C771 Secondary and unspecified malignant neoplasm of intrathoracic lymph nodes: Secondary | ICD-10-CM | POA: Insufficient documentation

## 2013-08-18 DIAGNOSIS — J90 Pleural effusion, not elsewhere classified: Secondary | ICD-10-CM

## 2013-08-18 NOTE — Telephone Encounter (Signed)
This RN spoke with pt per her call stating noted increase in pain in chest post thorencentesis.  Goldie states overall symptoms of cough and feeling of " weight " in her chest is better but now has had an increase in pain radiating up from left chest.  Pain is more notable with laying down - " and difficult to get to sleep and then wakes me up if I move ".  Pain is less with standing and sitting- pain is not worse with walking - pt stated "actually I just got off the treadmill and had no problems "  Deema is using aleve with some benefit- except at night per above description.  Per call plan is for pt to obtain a CXR prior to visit tomorrow with MD. She declines need for additional pain medication stating " I do not like taking a pill for everything but want to know why I am having pain and see how to get it better ".  Kaylin also inquired concern for obtaining more time then scheduled appointment " because I know I have a lot of questions ", " I know you guys are on roller blades right now.  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 but I feel that from early May to when I had the thorencentesis - the cancer seemed to be more aggressive "  " I feel I am overall physically in very good shape and would like to participate in the vaccine trial for breast cancer " " I am able to travel about anywhere in the county for a study "  I am someone who wants to know what I am dealing with, how we are going to deal with it and then when do I call with what concerns "  At end of conversation pt understood MD would be made aware of her concerns- this RN reassured her MD will address these appropriately with her despite the current busy-ness of the office.  No other needs at this time.

## 2013-08-19 ENCOUNTER — Other Ambulatory Visit: Payer: Self-pay | Admitting: *Deleted

## 2013-08-19 ENCOUNTER — Ambulatory Visit (HOSPITAL_COMMUNITY)
Admission: RE | Admit: 2013-08-19 | Discharge: 2013-08-19 | Disposition: A | Discharge status: Home / Self Care | Payer: 59 | Source: Ambulatory Visit | Attending: Oncology | Admitting: Oncology

## 2013-08-19 ENCOUNTER — Telehealth: Payer: Self-pay | Admitting: Oncology

## 2013-08-19 ENCOUNTER — Other Ambulatory Visit: Payer: Self-pay

## 2013-08-19 ENCOUNTER — Ambulatory Visit (HOSPITAL_BASED_OUTPATIENT_CLINIC_OR_DEPARTMENT_OTHER): Payer: 59 | Admitting: Oncology

## 2013-08-19 ENCOUNTER — Telehealth: Payer: Self-pay | Admitting: *Deleted

## 2013-08-19 VITALS — BP 139/83 | HR 79 | Temp 98.8°F | Resp 18 | Ht 63.0 in | Wt 171.9 lb

## 2013-08-19 DIAGNOSIS — C782 Secondary malignant neoplasm of pleura: Secondary | ICD-10-CM

## 2013-08-19 DIAGNOSIS — R599 Enlarged lymph nodes, unspecified: Secondary | ICD-10-CM

## 2013-08-19 DIAGNOSIS — J90 Pleural effusion, not elsewhere classified: Secondary | ICD-10-CM

## 2013-08-19 DIAGNOSIS — C773 Secondary and unspecified malignant neoplasm of axilla and upper limb lymph nodes: Secondary | ICD-10-CM

## 2013-08-19 DIAGNOSIS — C50211 Malignant neoplasm of upper-inner quadrant of right female breast: Secondary | ICD-10-CM

## 2013-08-19 DIAGNOSIS — M949 Disorder of cartilage, unspecified: Secondary | ICD-10-CM

## 2013-08-19 DIAGNOSIS — Z170 Estrogen receptor positive status [ER+]: Secondary | ICD-10-CM

## 2013-08-19 DIAGNOSIS — I890 Lymphedema, not elsewhere classified: Secondary | ICD-10-CM

## 2013-08-19 DIAGNOSIS — J9819 Other pulmonary collapse: Secondary | ICD-10-CM | POA: Insufficient documentation

## 2013-08-19 DIAGNOSIS — C50919 Malignant neoplasm of unspecified site of unspecified female breast: Secondary | ICD-10-CM

## 2013-08-19 DIAGNOSIS — E7889 Other lipoprotein metabolism disorders: Secondary | ICD-10-CM

## 2013-08-19 DIAGNOSIS — M899 Disorder of bone, unspecified: Secondary | ICD-10-CM

## 2013-08-19 NOTE — Progress Notes (Signed)
[name] [name]  Telephone:([phone]) [phone] Fax:(336) [phone]     ID: [name] [name] OB: [birth year]  MR#: [id number]  TOI#:[id number]  PCP: Pcp Not In System GYN:  [name] [name] SU:  OTHER MD: [name] [name], [name] [name]  CHIEF COMPLAINT: Stage IV breast cancer/ under active treatment  BREAST CANCER HISTORY: From doctorKalsoom [name]'s intake note 03/20/2004:  "The patient is a very pleasant 53 year old female, without significant past medical history.  Her family history is significant for a sister who at age 95 was diagnosed with invasive ductal carcinoma.  She is a breast cancer survivor at age 30 now.  The patient states that she has never really had a screening mammogram until October 2005, when she felt that it was time for her to start having mammograms done on a yearly basis.  Therefore, on 01/26/04, she underwent a screening mammogram and an abnormality was detected in the upper outer right breast.  She, therefore, underwent spot compression views of both the right and the left breast.  The left breast revealed a well-defined mass in the upper outer left quadrant, present at the 2 o'clock position, measuring 1.8 cm, 6 cm from the nipple.  This, by ultrasound, was felt to be a simple cyst measuring 1.8 cm.  On the right breast, a spiculated mass was noted in the upper outer right quadrant.  The ultrasound revealed a shadowing irregular solid mass at the 10:30 position, 9 cm from the nipple, measuring 1.2 cm in greatest dimension, correlating with the spiculated mass seen on the mammogram.  The right axilla was negative ultrasonically.  Because of this, the patient underwent a needle biopsy of the right breast and the biopsy was positive invasive mammary carcinoma that showed features consistent with a high-grade invasive ductal carcinoma associated with desmoplastic stroma.  No in situ component was seen and no definite lymphovascular invasion was identified.  On the core biopsy,  the tumor measured about 0.8 cm.  Because of this, she was seen by Dr. [name] and the patient was taken to the [facility] on March 15, 2004.  She underwent a right breast lumpectomy with sentinel node biopsy.  The final pathology revealed an invasive ductal carcinoma, measuring 1.7 cm, grade 2 of 3.  Margins were free of tumor.  Atypical lobular hyperplasia was noted.  One sentinel node was removed which was negative for metastatic disease.  The tumor was staged at T1c, N0 MX.  It was estrogen receptor positive, progesterone receptor positive.  HER-2/neu was 2+.  FISH was negative.  All margins were free of tumor.  She is now seen in Medical Oncology for further evaluation and management of this newly diagnosed T1c, node negative, stage I, invasive ductal carcinoma of the right breast."  Her subsequent history is as detailed below  INTERVAL HISTORY: [name] [name] returns today for followup of her breast cancer accompanied by her husband [name] [name]. She is establishing herself in my practice today. Since her most recent visit here she was evaluated for worsening shortness of breath and left-sided chest wall pain, with PET scan 08/16/2013 showing a large left pleural effusion with mediastinal shift to the right. 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 The cells were estrogen receptor positive, progesterone receptor negative on immunostains  REVIEW OF SYSTEMS: [name] [name] is still having some pain at the thoracentesis site. She also has developed more pain in the lower lateral left chest wall.  This is not clearly pleuritic. She has a mild cough, which is dry. She gets short of breath when walking up stairs, but not with normal activity. Sometimes she gets leg cramps. She describes herself as forgetful. She has hot flashes. She denies unusual headaches, visual changes, dizziness, gait imbalance, nausea,  vomiting, or neck stiffness. There has been no change in bowel or bladder habits. A detailed review of systems was otherwise stable  PAST MEDICAL HISTORY: Past Medical History  Diagnosis Date  . Seizures 2010    Isolated incident.  . Breast cancer dx'd 2005/2011  . PONV (postoperative nausea and vomiting)   . Peripheral vascular disease 02/2010    blood clot related to porta cath    PAST SURGICAL HISTORY: Past Surgical History  Procedure Laterality Date  . Breast lumpectomy  2005  . Axillary lymph node dissection  Dec. 2011  . Portacath placement  12/11  . Removal portacath    . Mediastinotomy chamberlain mcneil Left 06/02/2013    Procedure: MEDIASTINOTOMY CHAMBERLAIN MCNEIL;  Surgeon: [name], MD;  Location: [facility];  Service: Thoracic;  Laterality: Left;  LEFT ANTERIOR MEDIASTINOTOMY     FAMILY HISTORY No family history on file. The patient's father is living, 24 years old as of may 2015. He lives in [place]. The patient's mother died from complications of COPD at the age of 33. These has 2 brothers, one sister. Her sister developed breast cancer at the age of 37. She is doing well. The patient herself underwent genetic testing at [facility] in 2011 and was found to be BRCA negative  GYNECOLOGIC HISTORY:  Menarche age 65, she is GX P0. She stopped having periods with her initial chemotherapy in 2006.  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 Her husband [name] [name] a Medical illustrator in [place]. He has a child from a prior marriage. At home they have 2 rescue talks, [place] and [place]. The patient is religious but not a church attender    ADVANCED DIRECTIVES:    HEALTH MAINTENANCE: History  Substance Use Topics  . Smoking status: Never Smoker   . Smokeless tobacco: Never Used  . Alcohol Use: No     Colonoscopy:  PAP:  Bone density:  Lipid panel:  Allergies  Allergen Reactions  .  Morphine And Related   . Tegaderm Ag Mesh [Silver]     Current Outpatient Prescriptions  Medication Sig Dispense Refill  . ALPRAZolam (XANAX) 0.25 MG tablet Take 0.25 mg by mouth once.      . ALPRAZolam (XANAX) 0.5 MG tablet Take 1 tablet (0.5 mg total) by mouth at bedtime as needed for sleep.  30 tablet  1  . Aspirin-Acetaminophen-Caffeine (EXCEDRIN PO) Take 2 tablets by mouth daily as needed (pain).      Marland Kitchen b complex vitamins capsule Take 1 capsule by mouth daily.      . Calcium-Magnesium-Vitamin D (CITRACAL CALCIUM+D PO) Take by mouth. 423m vitamin C, 509mvitamin D3 2 PO DAILY      . folic acid (FOLVITE) 1 MG tablet Take 1 mg by mouth daily.      . Marland Kitchenetrozole (FEMARA) 2.5 MG tablet Take 1 tablet (2.5 mg total) by mouth daily.  30 tablet  6  . Magnesium 100 MG CAPS Take 250 each by mouth daily. Taking 250 mg per day      . Melatonin 1 MG TABS Take 1 mg by mouth at  bedtime as needed (sleep).       Marland Kitchen oxyCODONE (OXY IR/ROXICODONE) 5 MG immediate release tablet Take 1-2 tablets (5-10 mg total) by mouth every 4 (four) hours as needed for moderate pain.  30 tablet  0  . VITAMIN D, CHOLECALCIFEROL, PO Take 2,000 Units by mouth daily.        No current facility-administered medications for this visit.   Facility-Administered Medications Ordered in Other Visits  Medication Dose Route Frequency Provider Last Rate Last Dose  . ALPRAZolam Duanne Moron) tablet 0.25 mg  0.25 mg Oral Once Bill Salinas, NP        OBJECTIVE: Middle-aged white woman who appears stated age 21 Vitals:   08/19/13 0837  BP: 139/83  Pulse: 79  Temp: 98.8 F (37.1 C)  Resp: 18     Body mass index is 30.46 kg/(m^2).      ECOG FS:1 - Symptomatic but completely ambulatory  Ocular: Sclerae unicteric, pupils equal, round and reactive to light Ear-nose-throat: Oropharynx clear, dentition in good repair Lymphatic: No cervical or supraclavicular adenopathy Lungs no rales or rhonchi, minimal dullness to percussion left  base Heart regular rate and rhythm, no murmur appreciated Abd soft, nontender, positive bowel sounds MSK no focal spinal tenderness, no upper extremity lymphedema Neuro: non-focal, well-oriented, anxious affect Breasts: The right breast is status post lumpectomy and radiation. There are no skin or nipple changes of concern. There is no evidence of local recurrence. The right axillae is status post full dissection. It is benign. Left breast is unremarkable   LAB RESULTS:  CMP     Component Value Date/Time   NA 139 08/04/2013 1327   NA 135* 05/31/2013 1003   K 3.7 08/04/2013 1327   K 4.4 05/31/2013 1003   CL 98 05/31/2013 1003   CL 105 05/06/2012 1333   CO2 25 08/04/2013 1327   CO2 20 05/31/2013 1003   GLUCOSE 100 08/04/2013 1327   GLUCOSE 95 05/31/2013 1003   GLUCOSE 124* 05/06/2012 1333   BUN 11.8 08/04/2013 1327   BUN 14 05/31/2013 1003   CREATININE 0.9 08/04/2013 1327   CREATININE 0.71 05/31/2013 1003   CALCIUM 10.1 08/04/2013 1327   CALCIUM 9.5 05/31/2013 1003   PROT 7.2 08/04/2013 1327   PROT 7.2 05/31/2013 1003   ALBUMIN 3.8 08/04/2013 1327   ALBUMIN 3.6 05/31/2013 1003   AST 41* 08/04/2013 1327   AST 41* 05/31/2013 1003   ALT 81* 08/04/2013 1327   ALT 71* 05/31/2013 1003   ALKPHOS 81 08/04/2013 1327   ALKPHOS 59 05/31/2013 1003   BILITOT 0.28 08/04/2013 1327   BILITOT 0.2* 05/31/2013 1003   GFRNONAA >90 05/31/2013 1003   GFRAA >90 05/31/2013 1003    I No results found for this basename: SPEP,  UPEP,   kappa and lambda light chains    Lab Results  Component Value Date   WBC 7.0 08/04/2013   NEUTROABS 4.9 08/04/2013   HGB 13.3 08/04/2013   HCT 39.6 08/04/2013   MCV 94.8 08/04/2013   PLT 355 08/04/2013      Chemistry      Component Value Date/Time   NA 139 08/04/2013 1327   NA 135* 05/31/2013 1003   K 3.7 08/04/2013 1327   K 4.4 05/31/2013 1003   CL 98 05/31/2013 1003   CL 105 05/06/2012 1333   CO2 25 08/04/2013 1327   CO2 20 05/31/2013 1003   BUN 11.8 08/04/2013 1327   BUN 14 05/31/2013 1003   CREATININE 0.9  08/04/2013 1327    CREATININE 0.71 05/31/2013 1003      Component Value Date/Time   CALCIUM 10.1 08/04/2013 1327   CALCIUM 9.5 05/31/2013 1003   ALKPHOS 81 08/04/2013 1327   ALKPHOS 59 05/31/2013 1003   AST 41* 08/04/2013 1327   AST 41* 05/31/2013 1003   ALT 81* 08/04/2013 1327   ALT 71* 05/31/2013 1003   BILITOT 0.28 08/04/2013 1327   BILITOT 0.2* 05/31/2013 1003       Lab Results  Component Value Date   LABCA2 25 11/02/2007    No components found with this basename: [id number]    No results found for this basename: INR,  in the last 168 hours  Urinalysis    Component Value Date/Time   COLORURINE YELLOW 05/31/2013 1003   APPEARANCEUR CLEAR 05/31/2013 1003   LABSPEC 1.008 05/31/2013 1003   PHURINE 6.5 05/31/2013 1003   GLUCOSEU NEGATIVE 05/31/2013 1003   HGBUR NEGATIVE 05/31/2013 1003   [place] 05/31/2013 1003   [place] 05/31/2013 1003   PROTEINUR NEGATIVE 05/31/2013 1003   UROBILINOGEN 0.2 05/31/2013 1003   NITRITE NEGATIVE 05/31/2013 1003   LEUKOCYTESUR NEGATIVE 05/31/2013 1003    STUDIES: Dg Chest 1 View  08/16/2013   CLINICAL DATA:  Status post left thoracentesis.  EXAM: CHEST - 1 VIEW  COMPARISON:  PET-CT 08/16/2013. Thoracic spine radiographs 08/04/2013. Chest radiographs 05/31/2013.  FINDINGS: The cardiac silhouette is partially obscured without gross enlargement. There is a small left pleural effusion, greatly decreased in size compared to recent PET-CT. There is a small loculated pneumothorax at the left lung base. Opacity in the left mid to lower lung likely represents atelectasis. The right lung is clear. Surgical clips are present in the right axilla.  IMPRESSION: 1. Small left pleural effusion, greatly decreased in size following thoracentesis. 2. Loculated gas collection at the left lung base consistent with a small ex vacuo pneumothorax. 3. Left basilar parenchymal opacity, likely reflecting atelectasis. These results were communicated to Dr. Jacqulynn Cadet at the time of dictation.    Electronically Signed   By: Logan Bores   On: 08/16/2013 17:05   Dg Thoracic Spine 2 View  08/04/2013   CLINICAL DATA:  Upper back pain. History of breast carcinoma. No injury.  EXAM: THORACIC SPINE - 2 VIEW  COMPARISON:  Lateral chest radiograph, 05/31/2013  FINDINGS: No fracture or bone lesion. Specifically no osteoblastic or osteolytic lesions. No spondylolisthesis. There are minor degenerative changes along the mid thoracic spine reflected by small endplate osteophytes.  There is a left pleural effusion, at least moderate in size, increased from the prior study.  IMPRESSION: No fracture. No evidence of metastatic disease to the spine. Mild mid thoracic spine disk degenerative change.  Moderate left pleural effusion, increased in size from the prior chest radiograph.   Electronically Signed   By: Lajean Manes M.D.   On: 08/04/2013 14:36   Nm Pet Image Restag (ps) Skull Base To Thigh  08/16/2013   ADDENDUM REPORT: 08/16/2013 11:14  ADDENDUM: The original report was by Dr. Van Clines. The following addendum is by Dr. Van Clines:  These results were called by telephone at the time of interpretation on 08/16/2013 at 11:14 AM to Dr. Lurline Del , who verbally acknowledged these results.   Electronically Signed   By: Sherryl Barters M.D.   On: 08/16/2013 11:14   08/16/2013   CLINICAL DATA:  Subsequent treatment strategy for breast cancer. New symptoms of pain and cough.  EXAM: NUCLEAR MEDICINE  PET SKULL BASE TO THIGH  TECHNIQUE: 8.7 mCi F-18 FDG was injected intravenously. Full-ring PET imaging was performed from the skull base to thigh after the radiotracer. CT data was obtained and used for attenuation correction and anatomic localization.  FASTING BLOOD GLUCOSE:  Value: 90 mg/dl  COMPARISON:  01/10/2013  FINDINGS: NECK  Symmetric laryngeal activity and symmetric salivary gland activity, likely physiologic.  CHEST  Extensive left pleural metastatic disease. Index pleural rind post for  medially along the left hemidiaphragmatic border with maximum standard uptake value 13.6. Confluent prevascular and adjacent pleural based mass short axis diameter 2.7 cm, maximum standard uptake value 13.9. Large left pleural effusion encompasses at least 75% of the left hemithorax. Cardiac and mediastinal structures are shifted to the right.  ABDOMEN/PELVIS  Adenopathy just above the celiac trunk measures up to 1.3 cm in short axis with conglomerate maximum standard uptake value 8.8. Nodal metastatic disease posterior to the left adrenal gland in the left periaortic region with individual node short axis diameter 1.1 cm and conglomerate maximum standard uptake value of 14.1.  Ancillary findings of hepatic steatosis, non hypermetabolic but a hypodense lesion in segment 4B be of the liver, and levoconvex lumbar scoliosis.  SKELETON  Left medial clavicular metastatic lesion, maximum standard uptake value 7.5.  IMPRESSION: 1. Extensive multifocal left pleural metastatic disease with hypermetabolic prevascular and upper abdominal retroperitoneal adenopathy. 2. Metastatic lesion in the left medial clavicle. 3. Large left pleural effusion with shift of cardiac and mediastinal structures to the right. There are described cases of tension hydrothorax in patients with malignant pleural effusions, and given the mediastinal shift without explaining atelectasis in the right lung, this is a possibility in this patient. 4. Hepatic steatosis.  Electronically Signed: By: Sherryl Barters M.D. 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 She presents for therapeutic and diagnostic thoracentesis.  EXAM: US THORACENTESIS ASP PLEURAL SPACE W/IMG GUIDE  Date: 08/16/2013  PROCEDURE: 1. Ultrasound-guided left thoracentesis Interventional Radiologist:   Criselda Peaches, MD  ANESTHESIA/SEDATION: None required  TECHNIQUE: Informed consent was obtained from the patient following explanation of the procedure, risks, benefits and alternatives. The patient understands, agrees and consents for the procedure. All questions were addressed. A time out was performed.  The left posterior thorax was interrogated with ultrasound. There is a large pleural effusion. Inferiorly, there is a thickened pleural rind consistent with the known pleural implants. A suitable window in the more superior mid scapular line was selected.  The region was prepped and draped in the standard fashion using Betadine. Local anesthesia was attained by infiltration with 1% lidocaine. A small dermatotomy was made. Under real-time sonographic guidance, a 5 [place] Yueh centesis catheter was advanced over rhythm into these pleural fluid. Approximately 1.5 L of clear yellow pleural fluid was then hand aspirated. The fluid was sent for cytology.  A sterile Band-Aid was applied. The patient tolerated the procedure well; there was no immediate complication.  IMPRESSION: Ultrasound-guided left thoracentesis with aspiration of approximately 1.5 L of clear yellow pleural fluid.  Post thoracentesis text x-ray demonstrates a small basilar pneumothorax ex vacuo likely secondary to incomplete re-expansion of the lung.  Signed,  Criselda Peaches, MD  [facility]  [facility]   Electronically Signed   By: Jacqulynn Cadet M.D.   On: 08/16/2013  17:08    ASSESSMENT: 53 y.o. [place] woman with stage IV breast cancer, history as follows  (1)  S/p Right lumpectomy and sentinel lymph node sampling 03/15/2004 for a pT1c pN0. Stage IA invasive ductal carcinoma, grade 2, estrogen receptor 95% positive, progesterone receptor 65% positive, HER-2 not amplified; additional surgery 04/25/2004 for seroma or clearance showed no residual tumor  (2) adjuvant chemotherapy  with cyclophosphamide and doxorubicin every 21 days x4 completed 07/19/2004  (3) adjuvant radiation given under Dr. Donella Stade in [place] completed July 2006  (4) the patient opted against adjuvant antiestrogen therapy  (5) genetics testing showed no BRCA mutations  (6) biopsy of a palpable right axillary mass 10/24/2009 showed invasive ductal carcinoma, grade 3, estrogen receptor 100% positive, progesterone receptor 2% positive (alert score 5) HER-2 negative; no evidence of systemic disease on PET scanning  (7) completed 3 of 4 planned cycles of docetaxel and cyclophosphamide September 2011, fourth cycle omitted because of marked elevations in liver function tests  (8) an right axillary lymph node dissection 03/06/2010 showed 3/8 lymph nodes removed to be involved by tumor, with extracapsular extension.  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, TTS-1 negative), estrogen receptor 80% positive, progesterone receptor 2% positive, HER-2 not amplified  (12) letrozole started March 2015--poorly tolerated  (13) PET scan 08/16/2013 shows extensive left pleural metastatic disease and a large left pleural effusion that shifts cardiac and mediastinal structures to the right; adenopathy (celiac trunk, periadrenal, periaortic); and a left medial clavicular lesion; Status post left thoracentesis 08/16/2013 positive for adenocarcinoma, estrogen receptor positive, progesterone receptor negative.  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 I gave Lauran a summary of her breast cancer history to review and we made many corrections in terms of detail.  Looking forward, she now has asymptomatic cytologically positive left pleural effusion. She has been on letrozole for 2 months, so we have evidence of disease progression on that medication, although it is possibly a bit early to give up on that drug, since antiestrogen therapy can take several months to "a cold".  We went through 3 questions. The first one being should reinitiate or change treatment or moved to a palliative situation. I think there is no question that Oriyah is very functional and viable, and should have further treatment.  The second question is whether we should move to chemotherapy or continue with anti-estrogen therapy. My suggestion to Cyndra is that she consider eribulin, which is generally well-tolerated, and which may give [place] quicker control of her symptomatic left pleural disease. We could try that for 3-6 months assuming we document control, after which we could go back to antiestrogen therapy.  Tykiera is very reluctant to undergo chemotherapy chiefly because of concerns regarding  hair loss (yet another time), as well as neuropathy, low counts, and other possible side effects.  Accordingly we discussed antiestrogen options. One would be to add Ibrance to her letrozole, and this may be the simplest move at this point. We did discuss concerns regarding low counts and other side effects of that medication. The second option would be to go to exemestane and everolimus. A third option,  T1 with a fewer side effects but possibly the longest time to response, would be fulvestrant.  All these options were given to [facility] in writing. We were not able to reach a decision today, despite very extensive discussion, so she will return to see me in one week. Hopefully by that time we will be able to start her on a more effective treatment.  Genesys has a good understanding of the fact that her disease is not curable and that the goal of treatment her case is control. She will call with any problems that may develop before her next visit here.   Chauncey Cruel, MD   08/20/2013 10:42 AM

## 2013-08-19 NOTE — Telephone Encounter (Signed)
s/w pt re cxr 5/28 (walk in) and GM 5/29 @ 8am.

## 2013-08-19 NOTE — Telephone Encounter (Signed)
per pof tosch appt-sent Melissa email in re to overriding  am appt-Pt stated has MY CHART to review sch for 5/29

## 2013-08-19 NOTE — Telephone Encounter (Signed)
This RN reviewed with pt per her request MD recommendations regarding treatment choices.  [name] [name] is trying to decide between the [place] IV or femara with Ibrance.  Side effects and management of therapy discussed as well as benefit of both treatment regimans.  [name] [name] inquired about cost issues which may help her make choice.  This RN left an inbox message for managed care per above inquiry.

## 2013-08-20 MED ORDER — PALBOCICLIB 125 MG PO CAPS: 125.0000 mg | ORAL_CAPSULE | Freq: Every day | ORAL | Status: DC

## 2013-08-23 ENCOUNTER — Other Ambulatory Visit: Payer: Self-pay | Admitting: *Deleted

## 2013-08-23 ENCOUNTER — Encounter: Payer: Self-pay | Admitting: *Deleted

## 2013-08-23 ENCOUNTER — Ambulatory Visit (HOSPITAL_COMMUNITY)
Admission: RE | Admit: 2013-08-23 | Discharge: 2013-08-23 | Disposition: A | Discharge status: Home / Self Care | Payer: 59 | Source: Ambulatory Visit | Attending: Oncology | Admitting: Oncology

## 2013-08-23 ENCOUNTER — Ambulatory Visit: Payer: 59 | Admitting: Physical Therapy

## 2013-08-23 DIAGNOSIS — C50919 Malignant neoplasm of unspecified site of unspecified female breast: Secondary | ICD-10-CM

## 2013-08-23 DIAGNOSIS — J90 Pleural effusion, not elsewhere classified: Secondary | ICD-10-CM

## 2013-08-23 DIAGNOSIS — C50211 Malignant neoplasm of upper-inner quadrant of right female breast: Secondary | ICD-10-CM

## 2013-08-23 NOTE — Progress Notes (Unsigned)
Pt came in to the office post XRAY for review and plan.  Noted return of pleural effusion and need for thoracentesis.  Per conversation with pt and her husband- she states onset of " severe pain on Friday evening " with pt having to take oxycodone for relief.  They are still reviewing decision of oral vs IV chemo and are awaiting calls from both insurance and our office regarding potential out of pocket cost ".  Latashia states she would like to know from Dr Jana Hakim " if I start with the IV chemo and it improves my lung function - could we switch over to the oral chemo "  Chanetta states her primary concerns with the IV chemo is neuropathy ( has already ) and liver toxicity.  Plan at present is pt will obtain a thoracentesis tomorrow-  This RN will inquire with MD per above questions.

## 2013-08-23 NOTE — Addendum Note (Signed)
Addended by: Laureen Abrahams on: 08/23/2013 06:38 PM   Modules accepted: Orders, Medications

## 2013-08-24 ENCOUNTER — Ambulatory Visit: Payer: 59 | Admitting: Physical Therapy

## 2013-08-24 ENCOUNTER — Ambulatory Visit (HOSPITAL_COMMUNITY)
Admission: RE | Admit: 2013-08-24 | Discharge: 2013-08-24 | Disposition: A | Discharge status: Home / Self Care | Payer: 59 | Source: Ambulatory Visit | Attending: Interventional Radiology | Admitting: Interventional Radiology

## 2013-08-24 ENCOUNTER — Other Ambulatory Visit: Payer: Self-pay | Admitting: Oncology

## 2013-08-24 ENCOUNTER — Other Ambulatory Visit: Payer: Self-pay | Admitting: *Deleted

## 2013-08-24 ENCOUNTER — Ambulatory Visit (HOSPITAL_COMMUNITY)
Admission: RE | Admit: 2013-08-24 | Discharge: 2013-08-24 | Disposition: A | Discharge status: Home / Self Care | Payer: 59 | Source: Ambulatory Visit | Attending: Oncology | Admitting: Oncology

## 2013-08-24 VITALS — BP 112/72

## 2013-08-24 DIAGNOSIS — J90 Pleural effusion, not elsewhere classified: Secondary | ICD-10-CM

## 2013-08-24 DIAGNOSIS — C50211 Malignant neoplasm of upper-inner quadrant of right female breast: Secondary | ICD-10-CM

## 2013-08-24 DIAGNOSIS — J910 Malignant pleural effusion: Secondary | ICD-10-CM | POA: Insufficient documentation

## 2013-08-24 DIAGNOSIS — R0602 Shortness of breath: Secondary | ICD-10-CM | POA: Insufficient documentation

## 2013-08-24 DIAGNOSIS — C50919 Malignant neoplasm of unspecified site of unspecified female breast: Secondary | ICD-10-CM | POA: Insufficient documentation

## 2013-08-24 NOTE — Procedures (Signed)
Successful LT thoracentesis 1 liter removed No comp Stable Full report in PACS CXR PENDING

## 2013-08-26 ENCOUNTER — Telehealth: Payer: Self-pay | Admitting: Oncology

## 2013-08-26 ENCOUNTER — Ambulatory Visit (HOSPITAL_BASED_OUTPATIENT_CLINIC_OR_DEPARTMENT_OTHER): Payer: 59 | Admitting: Oncology

## 2013-08-26 ENCOUNTER — Other Ambulatory Visit: Payer: Self-pay | Admitting: *Deleted

## 2013-08-26 ENCOUNTER — Telehealth: Payer: Self-pay | Admitting: *Deleted

## 2013-08-26 VITALS — BP 145/96 | HR 108 | Temp 98.5°F | Resp 20 | Ht 63.0 in | Wt 172.2 lb

## 2013-08-26 DIAGNOSIS — C50919 Malignant neoplasm of unspecified site of unspecified female breast: Secondary | ICD-10-CM

## 2013-08-26 DIAGNOSIS — C50211 Malignant neoplasm of upper-inner quadrant of right female breast: Secondary | ICD-10-CM

## 2013-08-26 DIAGNOSIS — R071 Chest pain on breathing: Secondary | ICD-10-CM

## 2013-08-26 DIAGNOSIS — J90 Pleural effusion, not elsewhere classified: Secondary | ICD-10-CM

## 2013-08-26 DIAGNOSIS — K7689 Other specified diseases of liver: Secondary | ICD-10-CM

## 2013-08-26 DIAGNOSIS — C773 Secondary and unspecified malignant neoplasm of axilla and upper limb lymph nodes: Secondary | ICD-10-CM

## 2013-08-26 DIAGNOSIS — M899 Disorder of bone, unspecified: Secondary | ICD-10-CM

## 2013-08-26 DIAGNOSIS — I890 Lymphedema, not elsewhere classified: Secondary | ICD-10-CM

## 2013-08-26 DIAGNOSIS — C782 Secondary malignant neoplasm of pleura: Secondary | ICD-10-CM

## 2013-08-26 DIAGNOSIS — M949 Disorder of cartilage, unspecified: Secondary | ICD-10-CM

## 2013-08-26 DIAGNOSIS — R109 Unspecified abdominal pain: Secondary | ICD-10-CM

## 2013-08-26 MED ORDER — METOCLOPRAMIDE HCL 10 MG PO TABS
10.0000 mg | ORAL_TABLET | Freq: Three times a day (TID) | ORAL | Status: DC
Start: 2013-08-26 — End: 2013-09-08

## 2013-08-26 MED ORDER — ALPRAZOLAM 0.5 MG PO TABS: 0.5000 mg | ORAL_TABLET | Freq: Two times a day (BID) | ORAL | Status: DC | PRN

## 2013-08-26 MED ORDER — OMEPRAZOLE 40 MG PO CPDR: 40.0000 mg | DELAYED_RELEASE_CAPSULE | Freq: Every day | ORAL | Status: DC

## 2013-08-26 MED ORDER — NAPROXEN 500 MG PO TABS: 500.0000 mg | ORAL_TABLET | Freq: Three times a day (TID) | ORAL | Status: DC

## 2013-08-26 MED ORDER — ONDANSETRON HCL 8 MG PO TABS: 8.0000 mg | ORAL_TABLET | Freq: Two times a day (BID) | ORAL | Status: DC

## 2013-08-26 NOTE — Telephone Encounter (Signed)
, °

## 2013-08-26 NOTE — Progress Notes (Signed)
[place]  Telephone:(336) [phone] Fax:(336) [phone]     ID: [name] [name] OB: 1960-11-28  MR#: [id number]  IPP#:[id number]  PCP: Pcp Not In System GYN:  [name] SU:  OTHER MD: [name] [name], [name] [name]  CHIEF COMPLAINT: Stage IV breast cancer/ under active treatment  BREAST CANCER HISTORY: From doctorKalsoom [name]'s intake note 03/20/2004:  "The patient is a very pleasant 53 year old female, without significant past medical history.  Her family history is significant for a sister who at age 44 was diagnosed with invasive ductal carcinoma.  She is a breast cancer survivor at age 63 now.  The patient states that she has never really had a screening mammogram until October 2005, when she felt that it was time for her to start having mammograms done on a yearly basis.  Therefore, on 01/26/04, she underwent a screening mammogram and an abnormality was detected in the upper outer right breast.  She, therefore, underwent spot compression views of both the right and the left breast.  The left breast revealed a well-defined mass in the upper outer left quadrant, present at the 2 o'clock position, measuring 1.8 cm, 6 cm from the nipple.  This, by ultrasound, was felt to be a simple cyst measuring 1.8 cm.  On the right breast, a spiculated mass was noted in the upper outer right quadrant.  The ultrasound revealed a shadowing irregular solid mass at the 10:30 position, 9 cm from the nipple, measuring 1.2 cm in greatest dimension, correlating with the spiculated mass seen on the mammogram.  The right axilla was negative ultrasonically.  Because of this, the patient underwent a needle biopsy of the right breast and the biopsy was positive invasive mammary carcinoma that showed features consistent with a high-grade invasive ductal carcinoma associated with desmoplastic stroma.  No in situ component was seen and no definite lymphovascular invasion was identified.  On the core biopsy,  the tumor measured about 0.8 cm.  Because of this, she was seen by Dr. [name] and the patient was taken to the [facility] on March 15, 2004.  She underwent a right breast lumpectomy with sentinel node biopsy.  The final pathology revealed an invasive ductal carcinoma, measuring 1.7 cm, grade 2 of 3.  Margins were free of tumor.  Atypical lobular hyperplasia was noted.  One sentinel node was removed which was negative for metastatic disease.  The tumor was staged at T1c, N0 MX.  It was estrogen receptor positive, progesterone receptor positive.  HER-2/neu was 2+.  FISH was negative.  All margins were free of tumor.  She is now seen in Medical Oncology for further evaluation and management of this newly diagnosed T1c, node negative, stage I, invasive ductal carcinoma of the right breast."  Her subsequent history is as detailed below  INTERVAL HISTORY: [name] [name] returns today for followup of her breast cancer accompanied by her husband [name] [name]. Since her last visit here she had a second thoracentesis. She had considerably more pain this time. She does not feel the radiologist was attentive to her complaints. Nevertheless she is scheduled for another possible thoracentesis next week. She is here today to finalize a decision regarding systemic treatment  REVIEW OF SYSTEMS: [name]'s pain is worse, reaching "screaming level" sometimes. She has 2 and 20 mg Naprosyn, which she takes irregularly, and oxycodone, which she does not like to take because of the multiple side effects it causes. She is moderately constipated. She's not sleeping well. She denies pleurisy or hemoptysis. Have not  been unusual headaches, visual changes, dizziness or gait imbalance. She almost fainted however during the most recent thoracentesis. A detailed review of systems today was otherwise stable  PAST MEDICAL HISTORY: Past Medical History  Diagnosis Date  . Seizures 2010    Isolated incident.  . Breast cancer dx'd 2005/2011  . PONV  (postoperative nausea and vomiting)   . Peripheral vascular disease 02/2010    blood clot related to porta cath    PAST SURGICAL HISTORY: Past Surgical History  Procedure Laterality Date  . Breast lumpectomy  2005  . Axillary lymph node dissection  Dec. 2011  . Portacath placement  12/11  . Removal portacath    . Mediastinotomy chamberlain mcneil Left 06/02/2013    Procedure: MEDIASTINOTOMY CHAMBERLAIN MCNEIL;  Surgeon: [name], MD;  Location: [facility];  Service: Thoracic;  Laterality: Left;  LEFT ANTERIOR MEDIASTINOTOMY     FAMILY HISTORY No family history on file. The patient's father is living, 62 years old as of may 2015. He lives in [place]. The patient's mother died from complications of COPD at the age of 50. These has 2 brothers, one sister. Her sister developed breast cancer at the age of 31. She is doing well. The patient herself underwent genetic testing at [facility] in 2011 and was found to be BRCA negative  GYNECOLOGIC HISTORY:  Menarche age 1, she is GX P0. She stopped having periods with her initial chemotherapy in 2006.  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 Her husband [name] [name] a Medical illustrator in [place]. He has a child from a prior marriage. At home they have 2 rescue talks, [place] and [place]. The patient is religious but not a church attender    ADVANCED DIRECTIVES:    HEALTH MAINTENANCE: History  Substance Use Topics  . Smoking status: Never Smoker   . Smokeless tobacco: Never Used  . Alcohol Use: No     Colonoscopy:  PAP:  Bone density:  Lipid panel:  Allergies  Allergen Reactions  . Morphine And Related   . Tegaderm Ag Mesh [Silver]     Current Outpatient Prescriptions  Medication Sig Dispense Refill  . ALPRAZolam (XANAX) 0.25 MG tablet Take 0.25 mg by mouth once.      Marland Kitchen b complex vitamins capsule Take 1 capsule by mouth daily.      .  Calcium-Magnesium-Vitamin D (CITRACAL CALCIUM+D PO) Take by mouth. 474m vitamin C, 5071mvitamin D3 2 PO DAILY      . calcium-vitamin D (SM CALCIUM 500/VITAMIN D3) 500-400 MG-UNIT per tablet Take by mouth.      . folic acid (FOLVITE) 1 MG tablet Take 1 mg by mouth daily.      . Marland Kitchenetrozole (FEMARA) 2.5 MG tablet Take 1 tablet (2.5 mg total) by mouth daily.  30 tablet  6  . Magnesium 100 MG CAPS Take 250 each by mouth daily. Taking 250 mg per day      . Melatonin 1 MG TABS Take 1 mg by mouth at bedtime as needed (sleep).       . naproxen sodium (ANAPROX) 220 MG tablet Take 220 mg by mouth 2 (two) times daily with a meal.      . oxyCODONE (OXY IR/ROXICODONE) 5 MG immediate release tablet Take 1-2 tablets (5-10 mg total) by mouth every 4 (four) hours as needed for moderate pain.  30 tablet  0  . palbociclib (IBRANCE) 125 MG  capsule Take 1 capsule (125 mg total) by mouth daily with breakfast. Take whole with food.  21 capsule  12  . VITAMIN D, CHOLECALCIFEROL, PO Take 2,000 Units by mouth daily.        No current facility-administered medications for this visit.   Facility-Administered Medications Ordered in Other Visits  Medication Dose Route Frequency Provider Last Rate Last Dose  . ALPRAZolam Duanne Moron) tablet 0.25 mg  0.25 mg Oral Once Bill Salinas, NP        OBJECTIVE: Middle-aged white woman who appears stated age 64 Vitals:   08/26/13 0816  BP: 145/96  Pulse: 108  Temp: 98.5 F (36.9 C)  Resp: 20     Body mass index is 30.51 kg/(m^2).      ECOG FS:1 - Symptomatic but completely ambulatory  Ocular: Sclerae unicteric, EOMs intact Ear-nose-throat: Oropharynx clear and moist Lymphatic: No cervical or supraclavicular adenopathy Lungs no rales or rhonchi, decreased breath sounds left base Heart regular rate and rhythm Abd soft, nontender, positive bowel sounds MSK no focal spinal tenderness, no upper extremity lymphedema Neuro: non-focal, well-oriented, anxious affect Breasts:  Deferred  LAB RESULTS:  CMP     Component Value Date/Time   NA 139 08/04/2013 1327   NA 135* 05/31/2013 1003   K 3.7 08/04/2013 1327   K 4.4 05/31/2013 1003   CL 98 05/31/2013 1003   CL 105 05/06/2012 1333   CO2 25 08/04/2013 1327   CO2 20 05/31/2013 1003   GLUCOSE 100 08/04/2013 1327   GLUCOSE 95 05/31/2013 1003   GLUCOSE 124* 05/06/2012 1333   BUN 11.8 08/04/2013 1327   BUN 14 05/31/2013 1003   CREATININE 0.9 08/04/2013 1327   CREATININE 0.71 05/31/2013 1003   CALCIUM 10.1 08/04/2013 1327   CALCIUM 9.5 05/31/2013 1003   PROT 7.2 08/04/2013 1327   PROT 7.2 05/31/2013 1003   ALBUMIN 3.8 08/04/2013 1327   ALBUMIN 3.6 05/31/2013 1003   AST 41* 08/04/2013 1327   AST 41* 05/31/2013 1003   ALT 81* 08/04/2013 1327   ALT 71* 05/31/2013 1003   ALKPHOS 81 08/04/2013 1327   ALKPHOS 59 05/31/2013 1003   BILITOT 0.28 08/04/2013 1327   BILITOT 0.2* 05/31/2013 1003   GFRNONAA >90 05/31/2013 1003   GFRAA >90 05/31/2013 1003    I No results found for this basename: SPEP,  UPEP,   kappa and lambda light chains    Lab Results  Component Value Date   WBC 7.0 08/04/2013   NEUTROABS 4.9 08/04/2013   HGB 13.3 08/04/2013   HCT 39.6 08/04/2013   MCV 94.8 08/04/2013   PLT 355 08/04/2013      Chemistry      Component Value Date/Time   NA 139 08/04/2013 1327   NA 135* 05/31/2013 1003   K 3.7 08/04/2013 1327   K 4.4 05/31/2013 1003   CL 98 05/31/2013 1003   CL 105 05/06/2012 1333   CO2 25 08/04/2013 1327   CO2 20 05/31/2013 1003   BUN 11.8 08/04/2013 1327   BUN 14 05/31/2013 1003   CREATININE 0.9 08/04/2013 1327   CREATININE 0.71 05/31/2013 1003      Component Value Date/Time   CALCIUM 10.1 08/04/2013 1327   CALCIUM 9.5 05/31/2013 1003   ALKPHOS 81 08/04/2013 1327   ALKPHOS 59 05/31/2013 1003   AST 41* 08/04/2013 1327   AST 41* 05/31/2013 1003   ALT 81* 08/04/2013 1327   ALT 71* 05/31/2013 1003   BILITOT 0.28 08/04/2013 1327  BILITOT 0.2* 05/31/2013 1003       Lab Results  Component Value Date   LABCA2 25 11/02/2007    No components found with this basename: [id number]     No results found for this basename: INR,  in the last 168 hours  Urinalysis    Component Value Date/Time   COLORURINE YELLOW 05/31/2013 1003   APPEARANCEUR CLEAR 05/31/2013 1003   LABSPEC 1.008 05/31/2013 1003   PHURINE 6.5 05/31/2013 1003   GLUCOSEU NEGATIVE 05/31/2013 1003   HGBUR NEGATIVE 05/31/2013 1003   [place] 05/31/2013 1003   [place] 05/31/2013 1003   PROTEINUR NEGATIVE 05/31/2013 1003   UROBILINOGEN 0.2 05/31/2013 1003   NITRITE NEGATIVE 05/31/2013 1003   LEUKOCYTESUR NEGATIVE 05/31/2013 1003    STUDIES: Dg Chest 1 View  08/24/2013   CLINICAL DATA:  Status post left thoracentesis  EXAM: CHEST - 1 VIEW  COMPARISON:  08/23/2013  FINDINGS: There is been significant reduction in left-sided pleural effusion with only a small amount of fluid remaining following thoracentesis. No pneumothorax is noted. Mild atelectatic changes are noted in the left mid lung. The right lung remains clear. Postsurgical changes in the right axilla are again noted.  IMPRESSION: No evidence of post thoracentesis pneumothorax. Significant reduction in left pleural effusion is noted.   Electronically Signed   By: Inez Catalina M.D.   On: 08/24/2013 11:48   Dg Chest 1 View  08/16/2013   CLINICAL DATA:  Status post left thoracentesis.  EXAM: CHEST - 1 VIEW  COMPARISON:  PET-CT 08/16/2013. Thoracic spine radiographs 08/04/2013. Chest radiographs 05/31/2013.  FINDINGS: The cardiac silhouette is partially obscured without gross enlargement. There is a small left pleural effusion, greatly decreased in size compared to recent PET-CT. There is a small loculated pneumothorax at the left lung base. Opacity in the left mid to lower lung likely represents atelectasis. The right lung is clear. Surgical clips are present in the right axilla.  IMPRESSION: 1. Small left pleural effusion, greatly decreased in size following thoracentesis. 2. Loculated gas collection at the left lung base consistent with a small ex vacuo  pneumothorax. 3. Left basilar parenchymal opacity, likely reflecting atelectasis. These results were communicated to Dr. Jacqulynn Cadet at the time of dictation.   Electronically Signed   By: Logan Bores   On: 08/16/2013 17:05   Dg Chest 2 View  08/23/2013   CLINICAL DATA:  Recent pneumothorax related to thoracentesis  EXAM: CHEST  2 VIEW  COMPARISON:  08/19/2013  FINDINGS: Enlarging moderate to large left pleural effusion noted with lingula and left lower lobe collapse/ consolidation compared to the prior study. No current pneumothorax. Stable right lung aeration. Previous breast surgery and axillary lymph node dissection on the right. Trachea is midline.  IMPRESSION: Enlarging left pleural effusion with lingula and left lower lobe collapse/consolidation.  No current pneumothorax.   Electronically Signed   By: Daryll Brod M.D.   On: 08/23/2013 14:19   Dg Chest 2 View  08/19/2013   CLINICAL DATA:  Recent thoracentesis, increasing pain in the left axillary region and under the left breast  EXAM: CHEST  2 VIEW  COMPARISON:  DG CHEST 1 VIEW dated 08/16/2013; DG THORACIC SPINE dated 08/04/2013  FINDINGS: There is persistent pleural effusion on the left not greatly changed since the earlier study. There is no pneumothorax or pneumomediastinum. Atelectatic change in the left mid lung has improved. The right lung is clear. The cardiopericardial silhouette is normal in size. The observed bony thorax  is normal.  IMPRESSION: Stable left pleural effusion; improved left-sided atelectasis, no pneumothorax.   Electronically Signed   By: David  Martinique   On: 08/19/2013 08:24   Dg Thoracic Spine 2 View  08/04/2013   CLINICAL DATA:  Upper back pain. History of breast carcinoma. No injury.  EXAM: THORACIC SPINE - 2 VIEW  COMPARISON:  Lateral chest radiograph, 05/31/2013  FINDINGS: No fracture or bone lesion. Specifically no osteoblastic or osteolytic lesions. No spondylolisthesis. There are minor degenerative changes along  the mid thoracic spine reflected by small endplate osteophytes.  There is a left pleural effusion, at least moderate in size, increased from the prior study.  IMPRESSION: No fracture. No evidence of metastatic disease to the spine. Mild mid thoracic spine disk degenerative change.  Moderate left pleural effusion, increased in size from the prior chest radiograph.   Electronically Signed   By: Lajean Manes M.D.   On: 08/04/2013 14:36   Nm Pet Image Restag (ps) Skull Base To Thigh  08/16/2013   ADDENDUM REPORT: 08/16/2013 11:14  ADDENDUM: The original report was by Dr. Van Clines. The following addendum is by Dr. Van Clines:  These results were called by telephone at the time of interpretation on 08/16/2013 at 11:14 AM to Dr. Lurline Del , who verbally acknowledged these results.   Electronically Signed   By: Sherryl Barters M.D.   On: 08/16/2013 11:14   08/16/2013   CLINICAL DATA:  Subsequent treatment strategy for breast cancer. New symptoms of pain and cough.  EXAM: NUCLEAR MEDICINE PET SKULL BASE TO THIGH  TECHNIQUE: 8.7 mCi F-18 FDG was injected intravenously. Full-ring PET imaging was performed from the skull base to thigh after the radiotracer. CT data was obtained and used for attenuation correction and anatomic localization.  FASTING BLOOD GLUCOSE:  Value: 90 mg/dl  COMPARISON:  01/10/2013  FINDINGS: NECK  Symmetric laryngeal activity and symmetric salivary gland activity, likely physiologic.  CHEST  Extensive left pleural metastatic disease. Index pleural rind post for medially along the left hemidiaphragmatic border with maximum standard uptake value 13.6. Confluent prevascular and adjacent pleural based mass short axis diameter 2.7 cm, maximum standard uptake value 13.9. Large left pleural effusion encompasses at least 75% of the left hemithorax. Cardiac and mediastinal structures are shifted to the right.  ABDOMEN/PELVIS  Adenopathy just above the celiac trunk measures up to 1.3 cm  in short axis with conglomerate maximum standard uptake value 8.8. Nodal metastatic disease posterior to the left adrenal gland in the left periaortic region with individual node short axis diameter 1.1 cm and conglomerate maximum standard uptake value of 14.1.  Ancillary findings of hepatic steatosis, non hypermetabolic but a hypodense lesion in segment 4B be of the liver, and levoconvex lumbar scoliosis.  SKELETON  Left medial clavicular metastatic lesion, maximum standard uptake value 7.5.  IMPRESSION: 1. Extensive multifocal left pleural metastatic disease with hypermetabolic prevascular and upper abdominal retroperitoneal adenopathy. 2. Metastatic lesion in the left medial clavicle. 3. Large left pleural effusion with shift of cardiac and mediastinal structures to the right. There are described cases of tension hydrothorax in patients with malignant pleural effusions, and given the mediastinal shift without explaining atelectasis in the right lung, this is a possibility in this patient. 4. Hepatic steatosis.  Electronically Signed: By: Sherryl Barters M.D. On: 08/16/2013 10:50   US Thoracentesis Asp Pleural Space W/img Guide  08/24/2013   CLINICAL DATA:  Malignant pleural effusion, shortness of breath, breast cancer  EXAM: ULTRASOUND GUIDED LEFT  THORACENTESIS  COMPARISON:  08/16/2013  PROCEDURE: An ultrasound guided thoracentesis was thoroughly discussed with the patient and questions answered. The benefits, risks, alternatives and complications were also discussed. The patient understands and wishes to proceed with the procedure. Written consent was obtained.  Ultrasound was performed to localize and mark an adequate pocket of fluid in the left chest. The area was then prepped and draped in the normal sterile fashion. 1% Lidocaine was used for local anesthesia. Under ultrasound guidance a 19 gauge Yueh catheter was introduced. Thoracentesis was performed. The catheter was removed and a dressing applied.   Complications:  No immediate  FINDINGS: A total of approximately 1 L of serosanguineous pleural fluid was removed. A fluid sample was notsent for laboratory analysis.  IMPRESSION: Successful ultrasound guided left thoracentesis yielding 1 L of pleural fluid.   Electronically Signed   By: Daryll Brod M.D.   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 She presents for therapeutic and diagnostic thoracentesis.  EXAM: US THORACENTESIS ASP PLEURAL SPACE W/IMG GUIDE  Date: 08/16/2013  PROCEDURE: 1. Ultrasound-guided left thoracentesis Interventional Radiologist:  Criselda Peaches, MD  ANESTHESIA/SEDATION: None required  TECHNIQUE: Informed consent was obtained from the patient following explanation of the procedure, risks, benefits and alternatives. The patient understands, agrees and consents for the procedure. All questions were addressed. A time out was performed.  The left posterior thorax was interrogated with ultrasound. There is a large pleural effusion. Inferiorly, there is a thickened pleural rind consistent with the known pleural implants. A suitable window in the more superior mid scapular line was selected.  The region was prepped and draped in the standard fashion using Betadine. Local anesthesia was attained by infiltration with 1% lidocaine. A small dermatotomy was made. Under real-time sonographic guidance, a 5 [place] Yueh centesis catheter was advanced over rhythm into these pleural fluid. Approximately 1.5 L of clear yellow pleural fluid was then hand aspirated. The fluid was sent for cytology.  A sterile Band-Aid was applied. The patient tolerated the procedure well; there was no immediate complication.  IMPRESSION: Ultrasound-guided left thoracentesis with aspiration of approximately 1.5 L of clear yellow pleural  fluid.  Post thoracentesis text x-ray demonstrates a small basilar pneumothorax ex vacuo likely secondary to incomplete re-expansion of the lung.  Signed,  Criselda Peaches, MD  [facility]  [facility]   Electronically Signed   By: Jacqulynn Cadet M.D.   On: 08/16/2013 17:08    ASSESSMENT: 53 y.o. [place] woman with stage IV breast cancer, history as follows  (1)  S/p Right lumpectomy and sentinel lymph node sampling 03/15/2004 for a pT1c pN0. Stage IA invasive ductal carcinoma, grade 2, estrogen receptor 95% positive, progesterone receptor 65% positive, HER-2 not amplified; additional surgery 04/25/2004 for seroma or clearance showed no residual tumor  (2) adjuvant chemotherapy with cyclophosphamide and doxorubicin every 21 days x4 completed 07/19/2004  (3) adjuvant radiation given under Dr. Donella Stade in [place] completed July 2006  (4) the patient opted against adjuvant antiestrogen therapy  (5) genetics testing showed no BRCA mutations  (6) biopsy of a palpable right axillary mass 10/24/2009 showed invasive ductal carcinoma, grade 3, estrogen receptor 100% positive, progesterone receptor 2% positive (alert score 5) HER-2 negative; no evidence of systemic disease on PET scanning  (7) completed 3 of 4  planned cycles of docetaxel and cyclophosphamide September 2011, fourth cycle omitted because of marked elevations in liver function tests  (8) an right axillary lymph node dissection 03/06/2010 showed 3/8 lymph nodes removed to be involved by tumor, with extracapsular extension.  (9) 45 Gy radiation to the right axillary and right supraclavicular nodal areas, with capecitabine sensitization, completed March 2012   (10) intolerant of letrozole and exemestane; on tamoxifen with interruptions September 2012 to March 2013, but then continuing on tamoxifen more continuously through March of 2015  (11) biopsy of mediastinal adenopathy  06/02/2013 shows invasive ductal carcinoma (gross cystic disease fluid protein positive, TTS-1 negative), estrogen receptor 80% positive, progesterone receptor 2% positive, HER-2 not amplified  (12) letrozole started March 2015-- tolerated with significant side effects, discontinued at the end of May 20 15th  (13) PET scan 08/16/2013 shows extensive left pleural metastatic disease and a large left pleural effusion that shifts cardiac and mediastinal structures to the right; adenopathy (celiac trunk, periadrenal, periaortic); and a left medial clavicular lesion; Status post left thoracentesis 08/16/2013 positive for adenocarcinoma, estrogen receptor positive, progesterone receptor negative.  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 [place] has many questions and misgivings regarding starting treatment, but she has decided to try the eribulin, and we will plan on the first dose June 4, with a day 8 treatment June 11. At this point she does not want to schedule any treatments beyond those. If she tolerates the treatments well and possibly there is some early evidence of response she may agree to further treatments. I offered for her  to come tochemotherapy school specifically to learn about this drug, but she preferred to just read about it on the handouts she received today.   We have determined that the Ibrance in her case would be very affordable. It will be the next step if she does not tolerate the eribulin. In the meantime she has been asked to stop the  letrozole  She had significantly more pain with a second thoracentesis on the first. We again discussed a Pleuryx, which would make the drainage much less painful and easier. At that happened one of my patients who has a Pleuryx in place was in a nearby room and Paulene was able to see what looked like. The big problem is that she wears a compressive device to keep down the fluid in her right upper chest and she feels likely correctly that the Pleuryx may interfere with that device. I suggested she take the device to radiology and see if they feel they could place a Pleuryx in a location that would allow her to still get her lymphedema treatment as she is doing. She is scheduled at radiology for possible thoracentesis every Tuesday.   She is having significant pain in the left chest wall and left flank area. I suggested she up her naproxen to 500 mg 3 times a day. She will start Prilosec 40 mg daily to avoid gastritis problems. She will use the oxycodone for breakthrough pain and particularly at bedtime, where it is less  important if it makes her somewhat sleepy.  For antinausea she will use Zofran and metoclopramide. She may cut the 10 mg metoclopramide in half if she finds the 10 mg is not well-tolerated or the 5 mg is sufficient.   Carianna has a good understanding of the overall plan. She agrees with it. She knows a goal of treatment in her case is control. She will call with any problems that may develop before her next visit here.  Chauncey Cruel, MD   08/26/2013 8:32 AM

## 2013-08-26 NOTE — Telephone Encounter (Signed)
Per staff message and POF I have scheduled appts.  JMW  

## 2013-08-29 ENCOUNTER — Other Ambulatory Visit: Payer: Self-pay | Admitting: *Deleted

## 2013-08-29 ENCOUNTER — Telehealth: Payer: Self-pay | Admitting: *Deleted

## 2013-08-29 DIAGNOSIS — C50211 Malignant neoplasm of upper-inner quadrant of right female breast: Secondary | ICD-10-CM

## 2013-08-29 DIAGNOSIS — J90 Pleural effusion, not elsewhere classified: Secondary | ICD-10-CM

## 2013-08-29 NOTE — Telephone Encounter (Signed)
Patient calling to make sure we have order for Pleurex placement with Thoracentesis tomorrow. She also would like for home health nurse to come on Friday morning for drainage to be done Tuesday and Fridays. Will arrange home health and new orders to Radiology. Also gathered Patient Education Materials for patient pickup for discussed treatment with Eribulin and Ibrance.  Patient still waiting of cost difference from insurance. Orders in place for indwelling cath placement, Richardson Landry in Radiology with notify patient of schedule.

## 2013-08-30 ENCOUNTER — Ambulatory Visit (HOSPITAL_COMMUNITY): Payer: 59

## 2013-08-30 ENCOUNTER — Encounter (HOSPITAL_COMMUNITY): Payer: Self-pay | Admitting: Pharmacy Technician

## 2013-08-30 ENCOUNTER — Other Ambulatory Visit: Payer: Self-pay | Admitting: *Deleted

## 2013-08-30 ENCOUNTER — Ambulatory Visit (HOSPITAL_COMMUNITY)
Admission: RE | Admit: 2013-08-30 | Discharge: 2013-08-30 | Disposition: A | Discharge status: Home / Self Care | Payer: 59 | Source: Ambulatory Visit | Attending: Oncology | Admitting: Oncology

## 2013-08-30 ENCOUNTER — Ambulatory Visit (HOSPITAL_COMMUNITY): Admission: RE | Admit: 2013-08-30 | Payer: 59 | Source: Ambulatory Visit

## 2013-08-30 ENCOUNTER — Other Ambulatory Visit (HOSPITAL_COMMUNITY): Payer: Self-pay

## 2013-08-30 DIAGNOSIS — J984 Other disorders of lung: Secondary | ICD-10-CM | POA: Insufficient documentation

## 2013-08-30 DIAGNOSIS — C50919 Malignant neoplasm of unspecified site of unspecified female breast: Secondary | ICD-10-CM

## 2013-08-30 DIAGNOSIS — C801 Malignant (primary) neoplasm, unspecified: Secondary | ICD-10-CM | POA: Insufficient documentation

## 2013-08-30 DIAGNOSIS — J90 Pleural effusion, not elsewhere classified: Secondary | ICD-10-CM | POA: Insufficient documentation

## 2013-08-30 DIAGNOSIS — R059 Cough, unspecified: Secondary | ICD-10-CM | POA: Insufficient documentation

## 2013-08-30 DIAGNOSIS — R05 Cough: Secondary | ICD-10-CM | POA: Insufficient documentation

## 2013-08-30 DIAGNOSIS — I517 Cardiomegaly: Secondary | ICD-10-CM | POA: Insufficient documentation

## 2013-08-30 NOTE — Progress Notes (Signed)
[facility] Forms completed and to be faxed today to [phone]. [facility] referral for nursing services for care and maintenance.

## 2013-08-31 ENCOUNTER — Telehealth: Payer: Self-pay

## 2013-08-31 ENCOUNTER — Encounter: Payer: Self-pay | Admitting: Physical Therapy

## 2013-08-31 ENCOUNTER — Other Ambulatory Visit: Payer: Self-pay | Admitting: Radiology

## 2013-08-31 NOTE — Telephone Encounter (Signed)
Faxed referral and instructions to [facility].  Sent to scan.    Faxed pleurx forms and orders to [facility].  Sent to scan.

## 2013-09-01 ENCOUNTER — Telehealth: Payer: Self-pay | Admitting: *Deleted

## 2013-09-01 ENCOUNTER — Encounter (HOSPITAL_COMMUNITY): Payer: Self-pay

## 2013-09-01 ENCOUNTER — Ambulatory Visit: Payer: Self-pay

## 2013-09-01 ENCOUNTER — Other Ambulatory Visit: Payer: Self-pay

## 2013-09-01 ENCOUNTER — Ambulatory Visit (HOSPITAL_COMMUNITY)
Admission: RE | Admit: 2013-09-01 | Discharge: 2013-09-01 | Disposition: A | Discharge status: Home / Self Care | Payer: 59 | Source: Ambulatory Visit | Attending: Oncology | Admitting: Oncology

## 2013-09-01 ENCOUNTER — Ambulatory Visit (HOSPITAL_COMMUNITY)
Admission: RE | Admit: 2013-09-01 | Discharge: 2013-09-01 | Disposition: A | Discharge status: Home / Self Care | Payer: 59 | Source: Ambulatory Visit | Attending: Interventional Radiology | Admitting: Interventional Radiology

## 2013-09-01 VITALS — BP 143/90 | HR 103 | Temp 97.7°F | Resp 18

## 2013-09-01 DIAGNOSIS — J910 Malignant pleural effusion: Secondary | ICD-10-CM | POA: Insufficient documentation

## 2013-09-01 DIAGNOSIS — C50211 Malignant neoplasm of upper-inner quadrant of right female breast: Secondary | ICD-10-CM

## 2013-09-01 DIAGNOSIS — Z9221 Personal history of antineoplastic chemotherapy: Secondary | ICD-10-CM | POA: Insufficient documentation

## 2013-09-01 DIAGNOSIS — C50919 Malignant neoplasm of unspecified site of unspecified female breast: Secondary | ICD-10-CM | POA: Insufficient documentation

## 2013-09-01 DIAGNOSIS — Z79899 Other long term (current) drug therapy: Secondary | ICD-10-CM | POA: Insufficient documentation

## 2013-09-01 DIAGNOSIS — J90 Pleural effusion, not elsewhere classified: Secondary | ICD-10-CM

## 2013-09-01 DIAGNOSIS — I739 Peripheral vascular disease, unspecified: Secondary | ICD-10-CM | POA: Insufficient documentation

## 2013-09-01 LAB — CBC WITH DIFFERENTIAL/PLATELET
BASOS ABS: 0 10*3/uL (ref 0.0–0.1)
Basophils Relative: 1 % (ref 0–1)
Eosinophils Absolute: 0.1 10*3/uL (ref 0.0–0.7)
Eosinophils Relative: 2 % (ref 0–5)
HEMATOCRIT: 38.7 % (ref 36.0–46.0)
Hemoglobin: 13.2 g/dL (ref 12.0–15.0)
LYMPHS PCT: 25 % (ref 12–46)
Lymphs Abs: 1.7 10*3/uL (ref 0.7–4.0)
MCH: 31.2 pg (ref 26.0–34.0)
MCHC: 34.1 g/dL (ref 30.0–36.0)
MCV: 91.5 fL (ref 78.0–100.0)
MONO ABS: 0.5 10*3/uL (ref 0.1–1.0)
Monocytes Relative: 7 % (ref 3–12)
Neutro Abs: 4.5 10*3/uL (ref 1.7–7.7)
Neutrophils Relative %: 65 % (ref 43–77)
Platelets: 440 10*3/uL — ABNORMAL HIGH (ref 150–400)
RBC: 4.23 MIL/uL (ref 3.87–5.11)
RDW: 12.7 % (ref 11.5–15.5)
WBC: 6.8 10*3/uL (ref 4.0–10.5)

## 2013-09-01 LAB — PROTIME-INR
INR: 1 (ref 0.00–1.49)
Prothrombin Time: 13 seconds (ref 11.6–15.2)

## 2013-09-01 LAB — APTT: APTT: 26 s (ref 24–37)

## 2013-09-01 MED ORDER — SODIUM CHLORIDE 0.9 % IV SOLN
INTRAVENOUS | Status: DC
  Administered 2013-09-01: 14:00:00 via INTRAVENOUS

## 2013-09-01 MED ORDER — MIDAZOLAM HCL 2 MG/2ML IJ SOLN
INTRAMUSCULAR | Status: AC | PRN
  Administered 2013-09-01: 2 mg via INTRAVENOUS
  Administered 2013-09-01: 1 mg via INTRAVENOUS

## 2013-09-01 MED ORDER — FENTANYL CITRATE 0.05 MG/ML IJ SOLN
INTRAMUSCULAR | Status: DC
Start: 2013-09-01 — End: 2013-09-02
  Filled 2013-09-01: qty 4

## 2013-09-01 MED ORDER — CEFAZOLIN SODIUM-DEXTROSE 2-3 GM-% IV SOLR
2.0000 g | Freq: Once | INTRAVENOUS | Status: AC
  Administered 2013-09-01: 2 g via INTRAVENOUS
  Filled 2013-09-01: qty 50

## 2013-09-01 MED ORDER — FENTANYL CITRATE 0.05 MG/ML IJ SOLN
INTRAMUSCULAR | Status: AC | PRN
  Administered 2013-09-01 (×2): 100 ug via INTRAVENOUS

## 2013-09-01 MED ORDER — MIDAZOLAM HCL 2 MG/2ML IJ SOLN
INTRAMUSCULAR | Status: AC
  Filled 2013-09-01: qty 4

## 2013-09-01 NOTE — Discharge Instructions (Signed)
Refer to PleurX Patient Information Packet by [facility]. Contact Dr. Virgie Dad office or [place] [phone])  for concerns/questions.    Moderate Sedation, Adult Moderate sedation is given to help you relax or even sleep through a procedure. You may remain sleepy, be clumsy, or have poor balance for several hours following this procedure. Arrange for a responsible adult, family member, or friend to take you home. A responsible adult should stay with you for at least 24 hours or until the medicines have worn off.  Do not participate in any activities where you could become injured for the next 24 hours, or until you feel normal again. Do not:  Drive.  Swim.  Ride a bicycle.  Operate heavy machinery.  Cook.  Use power tools.  Climb ladders.  Work at [employer].  Do not make important decisions or sign legal documents until you are improved.  Vomiting may occur if you eat too soon. When you can drink without vomiting, try water, juice, or soup. Try solid foods if you feel little or no nausea.  Only take over-the-counter or prescription medications for pain, discomfort, or fever as directed by your caregiver.If pain medications have been prescribed for you, ask your caregiver how soon it is safe to take them.  Make sure you and your family fully understands everything about the medication given to you. Make sure you understand what side effects may occur.  You should not drink alcohol, take sleeping pills, or medications that cause drowsiness for at least 24 hours.  If you smoke, do not smoke alone.  If you are feeling better, you may resume normal activities 24 hours after receiving sedation.  Keep all appointments as scheduled. Follow all instructions.  Ask questions if you do not understand. SEEK MEDICAL CARE IF:   Your skin is pale or bluish in color.  You continue to feel sick to your stomach (nauseous) or throw up (vomit).  Your pain is getting worse and  not helped by medication.  You have bleeding or swelling.  You are still sleepy or feeling clumsy after 24 hours. SEEK IMMEDIATE MEDICAL CARE IF:   You develop a rash.  You have difficulty breathing.  You develop any type of allergic problem.  You have a fever. Document Released: 12/10/2000 Document Revised: 06/09/2011 Document Reviewed: 11/22/2012 [facility] Patient Information 2014 [place].

## 2013-09-01 NOTE — Telephone Encounter (Signed)
Message left by Cyril Mourning with [facility] who states she was contacted today by outpt surgery per need for nursing care for "pleurex catheter ".  " this was the first time I am hearing about this and I am trying to coordinate her care and need some additional information "  Return call number given as [phone].  This RN returned call and obtained identified VM.  Detailed message left stating request for visits on Monday and Fridays as well she may return call and speak with this RN or Amy M per pt's care.  Noted documentation of communication 6/2 per need for home care.

## 2013-09-01 NOTE — H&P (Signed)
[name] [name] is an 53 y.o. female.   Chief Complaint: "I'm having a chest catheter put in" HPI: Patient with history of metastatic breast carcinoma and recurrent symptomatic malignant left pleural effusion presents today for left pleurx catheter placement.  Past Medical History  Diagnosis Date  . Seizures 2010    Isolated incident.  . Breast cancer dx'd 2005/2011  . PONV (postoperative nausea and vomiting)   . Peripheral vascular disease 02/2010    blood clot related to porta cath    Past Surgical History  Procedure Laterality Date  . Breast lumpectomy  2005  . Axillary lymph node dissection  Dec. 2011  . Portacath placement  12/11  . Removal portacath    . Mediastinotomy chamberlain mcneil Left 06/02/2013    Procedure: MEDIASTINOTOMY CHAMBERLAIN MCNEIL;  Surgeon: [name], MD;  Location: [facility];  Service: Thoracic;  Laterality: Left;  LEFT ANTERIOR MEDIASTINOTOMY     History reviewed. No pertinent family history. Social History:  reports that she has never smoked. She has never used smokeless tobacco. She reports that she does not drink alcohol or use illicit drugs.  Allergies:  Allergies  Allergen Reactions  . Decadron [Dexamethasone] Other (See Comments)    Patient does not tolerate steroids.   . Hydromorphone Hcl Nausea And Vomiting  . Morphine And Related   . Tegaderm Ag Mesh [Silver]     Current outpatient prescriptions:b complex vitamins capsule, Take 1 capsule by mouth daily., Disp: , Rfl: ;  Calcium-Magnesium-Vitamin D (CITRACAL CALCIUM+D PO), Take by mouth. 400mg  vitamin C, 500mg  vitamin D3 2 PO DAILY, Disp: , Rfl: ;  folic acid (FOLVITE) 1 MG tablet, Take 1 mg by mouth daily., Disp: , Rfl: ;  Magnesium 100 MG CAPS, Take 250 each by mouth daily. Taking 250 mg per day, Disp: , Rfl:  Melatonin 1 MG TABS, Take 3 mg by mouth at bedtime as needed (sleep). , Disp: , Rfl: ;  omeprazole (PRILOSEC) 40 MG capsule, Take 1 capsule (40 mg total) by mouth  daily., Disp: 30 capsule, Rfl: 12;  VITAMIN D, CHOLECALCIFEROL, PO, Take 2,000 Units by mouth daily. , Disp: , Rfl: ;  ALPRAZolam (XANAX) 0.5 MG tablet, Take 1 tablet (0.5 mg total) by mouth 2 (two) times daily as needed for anxiety., Disp: 30 tablet, Rfl: 0 metoCLOPramide (REGLAN) 10 MG tablet, Take 1 tablet (10 mg total) by mouth 4 (four) times daily -  before meals and at bedtime., Disp: 60 tablet, Rfl: 3;  naproxen (NAPROSYN) 500 MG tablet, Take 500 mg by mouth 3 (three) times daily as needed for moderate pain., Disp: , Rfl: ;  naproxen sodium (ANAPROX) 220 MG tablet, Take 220 mg by mouth 2 (two) times daily as needed (pain)., Disp: , Rfl:  ondansetron (ZOFRAN) 8 MG tablet, Take 1 tablet (8 mg total) by mouth 2 (two) times daily. Start the day after chemo for 2 days. Then take as needed for nausea or vomiting., Disp: 30 tablet, Rfl: 1;  oxyCODONE (OXY IR/ROXICODONE) 5 MG immediate release tablet, Take 1-2 tablets (5-10 mg total) by mouth every 4 (four) hours as needed for moderate pain., Disp: 30 tablet, Rfl: 0 palbociclib (IBRANCE) 125 MG capsule, Take 1 capsule (125 mg total) by mouth daily with breakfast. Take whole with food., Disp: 21 capsule, Rfl: 12 Current facility-administered medications:0.9 %  sodium chloride infusion, , Intravenous, Continuous, Ascencion Dike, PA-C, Last Rate: 50 mL/hr at 09/01/13 1331;  ceFAZolin (ANCEF) IVPB 2 g/50 mL premix, 2 g, Intravenous,  Once, Ascencion Dike, PA-C;  fentaNYL (SUBLIMAZE) 0.05 MG/ML injection, , , , ;  midazolam (VERSED) 2 MG/2ML injection, , , ,    Results for orders placed during the hospital encounter of 09/01/13 (from the past 48 hour(s))  APTT     Status: None   Collection Time    09/01/13  1:30 PM      Result Value Ref Range   aPTT 26  24 - 37 seconds  CBC WITH DIFFERENTIAL     Status: Abnormal   Collection Time    09/01/13  1:30 PM      Result Value Ref Range   WBC 6.8  4.0 - 10.5 K/uL   RBC 4.23  3.87 - 5.11 MIL/uL   Hemoglobin 13.2   12.0 - 15.0 g/dL   HCT 38.7  36.0 - 46.0 %   MCV 91.5  78.0 - 100.0 fL   MCH 31.2  26.0 - 34.0 pg   MCHC 34.1  30.0 - 36.0 g/dL   RDW 12.7  11.5 - 15.5 %   Platelets 440 (*) 150 - 400 K/uL   Neutrophils Relative % 65  43 - 77 %   Neutro Abs 4.5  1.7 - 7.7 K/uL   Lymphocytes Relative 25  12 - 46 %   Lymphs Abs 1.7  0.7 - 4.0 K/uL   Monocytes Relative 7  3 - 12 %   Monocytes Absolute 0.5  0.1 - 1.0 K/uL   Eosinophils Relative 2  0 - 5 %   Eosinophils Absolute 0.1  0.0 - 0.7 K/uL   Basophils Relative 1  0 - 1 %   Basophils Absolute 0.0  0.0 - 0.1 K/uL  PROTIME-INR     Status: None   Collection Time    09/01/13  1:30 PM      Result Value Ref Range   Prothrombin Time 13.0  11.6 - 15.2 seconds   INR 1.00  0.00 - 1.49   No results found.  Review of Systems  Constitutional: Negative for fever and chills.  Respiratory: Negative for hemoptysis.        Occ cough, dyspnea  Cardiovascular:       Occ left chest "pressure"  Gastrointestinal: Negative for nausea, vomiting and abdominal pain.  Musculoskeletal: Negative for back pain.  Neurological: Negative for headaches.  Endo/Heme/Allergies: Does not bruise/bleed easily.    Blood pressure 156/108, pulse 101, temperature 98 F (36.7 C), temperature source Oral, resp. rate 20, SpO2 99.00%. Physical Exam  Constitutional: She is oriented to person, place, and time. She appears well-developed and well-nourished.  Cardiovascular: Regular rhythm.   tachy  Respiratory: Effort normal.  Dim BS left, clear right  GI: Soft. Bowel sounds are normal. There is no tenderness.  Musculoskeletal: Normal range of motion. She exhibits no edema.  Neurological: She is alert and oriented to person, place, and time.     Assessment/Plan  Patient with history of metastatic breast carcinoma and recurrent symptomatic malignant left pleural effusion presents today for left pleurx catheter placement. Details/risks of procedure d/w pt /family with their  understanding and consent.  Crissie Sickles Allred 09/01/2013, 2:33 PM

## 2013-09-01 NOTE — Procedures (Signed)
Successful placement of a left sided pleural drainage catheter. Approximately 1 L of serous pleural fluid aspirated after catheter placement. No immediate complications.

## 2013-09-01 NOTE — Progress Notes (Signed)
Pt was given the pleurex kit of bottles.  Gave to pt's husband.

## 2013-09-02 ENCOUNTER — Ambulatory Visit (HOSPITAL_BASED_OUTPATIENT_CLINIC_OR_DEPARTMENT_OTHER): Payer: 59

## 2013-09-02 ENCOUNTER — Inpatient Hospital Stay (HOSPITAL_COMMUNITY): Admission: RE | Admit: 2013-09-02 | Payer: Self-pay | Source: Ambulatory Visit

## 2013-09-02 ENCOUNTER — Ambulatory Visit (HOSPITAL_COMMUNITY): Payer: Self-pay

## 2013-09-02 ENCOUNTER — Encounter: Payer: Self-pay | Admitting: *Deleted

## 2013-09-02 ENCOUNTER — Ambulatory Visit: Payer: 59 | Attending: Oncology | Admitting: Physical Therapy

## 2013-09-02 ENCOUNTER — Other Ambulatory Visit: Payer: Self-pay | Admitting: *Deleted

## 2013-09-02 VITALS — BP 149/87 | HR 109 | Resp 20

## 2013-09-02 DIAGNOSIS — C782 Secondary malignant neoplasm of pleura: Secondary | ICD-10-CM

## 2013-09-02 DIAGNOSIS — I890 Lymphedema, not elsewhere classified: Secondary | ICD-10-CM | POA: Insufficient documentation

## 2013-09-02 DIAGNOSIS — C50211 Malignant neoplasm of upper-inner quadrant of right female breast: Secondary | ICD-10-CM

## 2013-09-02 DIAGNOSIS — C50919 Malignant neoplasm of unspecified site of unspecified female breast: Secondary | ICD-10-CM

## 2013-09-02 DIAGNOSIS — J90 Pleural effusion, not elsewhere classified: Secondary | ICD-10-CM

## 2013-09-02 DIAGNOSIS — IMO0001 Reserved for inherently not codable concepts without codable children: Secondary | ICD-10-CM | POA: Insufficient documentation

## 2013-09-02 MED ORDER — SODIUM CHLORIDE 0.9 % IV SOLN
Freq: Once | INTRAVENOUS | Status: AC
  Administered 2013-09-02: 11:00:00 via INTRAVENOUS

## 2013-09-02 MED ORDER — LORAZEPAM 2 MG/ML IJ SOLN
INTRAMUSCULAR | Status: AC
  Filled 2013-09-02: qty 1

## 2013-09-02 MED ORDER — ONDANSETRON 8 MG/NS 50 ML IVPB
INTRAVENOUS | Status: AC
  Filled 2013-09-02: qty 8

## 2013-09-02 MED ORDER — LORAZEPAM 2 MG/ML IJ SOLN
0.5000 mg | Freq: Once | INTRAMUSCULAR | Status: AC
  Administered 2013-09-02: 0.5 mg via INTRAVENOUS

## 2013-09-02 MED ORDER — ONDANSETRON 8 MG/50ML IVPB (CHCC)
8.0000 mg | Freq: Once | INTRAVENOUS | Status: AC
  Administered 2013-09-02: 8 mg via INTRAVENOUS

## 2013-09-02 MED ORDER — SODIUM CHLORIDE 0.9 % IV SOLN
1.4000 mg/m2 | Freq: Once | INTRAVENOUS | Status: AC
  Administered 2013-09-02: 2.6 mg via INTRAVENOUS
  Filled 2013-09-02: qty 5.2

## 2013-09-02 MED ORDER — METOCLOPRAMIDE HCL 5 MG/ML IJ SOLN
INTRAMUSCULAR | Status: AC
  Filled 2013-09-02: qty 2

## 2013-09-02 MED ORDER — METOCLOPRAMIDE HCL 5 MG/ML IJ SOLN
10.0000 mg | Freq: Once | INTRAMUSCULAR | Status: AC
  Administered 2013-09-02: 10 mg via INTRAVENOUS

## 2013-09-02 NOTE — Patient Instructions (Signed)
[facility] Discharge Instructions for Patients Receiving Chemotherapy  Today you received the following chemotherapy agents [place] To help prevent nausea and vomiting after your treatment, we encourage you to take your nausea medication as directed.    If you develop nausea and vomiting that is not controlled by your nausea medication, call the clinic.   BELOW ARE SYMPTOMS THAT SHOULD BE REPORTED IMMEDIATELY:  *FEVER GREATER THAN 100.5 F  *CHILLS WITH OR WITHOUT FEVER  NAUSEA AND VOMITING THAT IS NOT CONTROLLED WITH YOUR NAUSEA MEDICATION  *UNUSUAL SHORTNESS OF BREATH  *UNUSUAL BRUISING OR BLEEDING  TENDERNESS IN MOUTH AND THROAT WITH OR WITHOUT PRESENCE OF ULCERS  *URINARY PROBLEMS  *BOWEL PROBLEMS  UNUSUAL RASH Items with * indicate a potential emergency and should be followed up as soon as possible.  Feel free to call the clinic you have any questions or concerns. The clinic phone number is [phone].

## 2013-09-02 NOTE — Progress Notes (Signed)
Patient in today for first chemo. Anxiety noted, blood pressure of 175/110 discussed with Dr Jana Hakim. We are not checking labs today also due to patient has not had chemo yet and Dr Jana Hakim said it was not necessary. Will give patient Ativan IV today with treatment for anxiety and nausea. Continue to give Zofran/Reglan.

## 2013-09-02 NOTE — Progress Notes (Signed)
[place] Work  Clinical Social Work was referred by nurse for assessment of psychosocial needs due to increased anxiety and adjustment to illness.  Clinical Social Worker met with patient at [facility] in the infusion room to offer support and assess for needs.  CSW introduced self and explained CSW role and assistance available. CSW provided pt with her card and info sheet about resources/support through the pt and family support center. Pt denied needs currently, had a friend with her and was not really wanting to engage with CSW today. CSW will continue to try to check in and offer support to pt. Pt was appreciative of visit.     Clinical Social Work interventions: Barrister's clerk with pt Resource and CSW role education  Loren Racer, [place] Social Worker [facility] for [place] Wednesday, Thursday and Friday Phone: [phone] Fax: [phone]

## 2013-09-03 ENCOUNTER — Emergency Department (HOSPITAL_COMMUNITY)
Admission: EM | Admit: 2013-09-03 | Discharge: 2013-09-03 | Disposition: A | Discharge status: Home / Self Care | Payer: 59 | Attending: Emergency Medicine | Admitting: Emergency Medicine

## 2013-09-03 ENCOUNTER — Encounter (HOSPITAL_COMMUNITY): Payer: Self-pay | Admitting: Emergency Medicine

## 2013-09-03 ENCOUNTER — Emergency Department (HOSPITAL_COMMUNITY): Payer: 59

## 2013-09-03 DIAGNOSIS — C7951 Secondary malignant neoplasm of bone: Secondary | ICD-10-CM | POA: Insufficient documentation

## 2013-09-03 DIAGNOSIS — R000 Tachycardia, unspecified: Secondary | ICD-10-CM | POA: Insufficient documentation

## 2013-09-03 DIAGNOSIS — C50919 Malignant neoplasm of unspecified site of unspecified female breast: Secondary | ICD-10-CM | POA: Insufficient documentation

## 2013-09-03 DIAGNOSIS — Z8679 Personal history of other diseases of the circulatory system: Secondary | ICD-10-CM | POA: Insufficient documentation

## 2013-09-03 DIAGNOSIS — C779 Secondary and unspecified malignant neoplasm of lymph node, unspecified: Secondary | ICD-10-CM | POA: Insufficient documentation

## 2013-09-03 DIAGNOSIS — Z79899 Other long term (current) drug therapy: Secondary | ICD-10-CM | POA: Insufficient documentation

## 2013-09-03 DIAGNOSIS — C7800 Secondary malignant neoplasm of unspecified lung: Secondary | ICD-10-CM | POA: Insufficient documentation

## 2013-09-03 DIAGNOSIS — F411 Generalized anxiety disorder: Secondary | ICD-10-CM | POA: Insufficient documentation

## 2013-09-03 DIAGNOSIS — C7952 Secondary malignant neoplasm of bone marrow: Secondary | ICD-10-CM

## 2013-09-03 DIAGNOSIS — G40909 Epilepsy, unspecified, not intractable, without status epilepticus: Secondary | ICD-10-CM | POA: Insufficient documentation

## 2013-09-03 DIAGNOSIS — R509 Fever, unspecified: Secondary | ICD-10-CM

## 2013-09-03 LAB — URINALYSIS, ROUTINE W REFLEX MICROSCOPIC
Bilirubin Urine: NEGATIVE
GLUCOSE, UA: NEGATIVE mg/dL
HGB URINE DIPSTICK: NEGATIVE
Ketones, ur: NEGATIVE mg/dL
Leukocytes, UA: NEGATIVE
Nitrite: NEGATIVE
Protein, ur: NEGATIVE mg/dL
SPECIFIC GRAVITY, URINE: 1.016 (ref 1.005–1.030)
Urobilinogen, UA: 0.2 mg/dL (ref 0.0–1.0)
pH: 6 (ref 5.0–8.0)

## 2013-09-03 LAB — COMPREHENSIVE METABOLIC PANEL
ALBUMIN: 3.3 g/dL — AB (ref 3.5–5.2)
ALT: 85 U/L — ABNORMAL HIGH (ref 0–35)
AST: 55 U/L — AB (ref 0–37)
Alkaline Phosphatase: 114 U/L (ref 39–117)
BUN: 8 mg/dL (ref 6–23)
CO2: 23 mEq/L (ref 19–32)
Calcium: 9 mg/dL (ref 8.4–10.5)
Chloride: 97 mEq/L (ref 96–112)
Creatinine, Ser: 0.64 mg/dL (ref 0.50–1.10)
GFR calc Af Amer: >90 mL/min (ref >90)
GFR calc non Af Amer: >90 mL/min (ref >90)
Glucose, Bld: 121 mg/dL — ABNORMAL HIGH (ref 70–99)
Potassium: 3.7 mEq/L (ref 3.7–5.3)
Sodium: 134 mEq/L — ABNORMAL LOW (ref 137–147)
TOTAL PROTEIN: 6.8 g/dL (ref 6.0–8.3)
Total Bilirubin: 0.3 mg/dL (ref 0.3–1.2)

## 2013-09-03 LAB — CBC WITH DIFFERENTIAL/PLATELET
BASOS ABS: 0 10*3/uL (ref 0.0–0.1)
BASOS PCT: 0 % (ref 0–1)
EOS ABS: 0.1 10*3/uL (ref 0.0–0.7)
EOS PCT: 1 % (ref 0–5)
HEMATOCRIT: 37.9 % (ref 36.0–46.0)
Hemoglobin: 13.2 g/dL (ref 12.0–15.0)
Lymphocytes Relative: 9 % — ABNORMAL LOW (ref 12–46)
Lymphs Abs: 0.5 10*3/uL — ABNORMAL LOW (ref 0.7–4.0)
MCH: 31.1 pg (ref 26.0–34.0)
MCHC: 34.8 g/dL (ref 30.0–36.0)
MCV: 89.2 fL (ref 78.0–100.0)
MONO ABS: 0.3 10*3/uL (ref 0.1–1.0)
Monocytes Relative: 5 % (ref 3–12)
NEUTROS ABS: 4.6 10*3/uL (ref 1.7–7.7)
Neutrophils Relative %: 85 % — ABNORMAL HIGH (ref 43–77)
Platelets: 344 10*3/uL (ref 150–400)
RBC: 4.25 MIL/uL (ref 3.87–5.11)
RDW: 12.2 % (ref 11.5–15.5)
WBC: 5.5 10*3/uL (ref 4.0–10.5)

## 2013-09-03 LAB — I-STAT CG4 LACTIC ACID, ED: LACTIC ACID, VENOUS: 0.67 mmol/L (ref 0.5–2.2)

## 2013-09-03 MED ORDER — ONDANSETRON 8 MG PO TBDP
8.0000 mg | ORAL_TABLET | Freq: Once | ORAL | Status: AC
  Administered 2013-09-03: 8 mg via ORAL
  Filled 2013-09-03: qty 1

## 2013-09-03 MED ORDER — NAPROXEN 500 MG PO TABS
500.0000 mg | ORAL_TABLET | Freq: Once | ORAL | Status: AC
  Administered 2013-09-03: 500 mg via ORAL
  Filled 2013-09-03: qty 1

## 2013-09-03 NOTE — ED Provider Notes (Signed)
CSN: [id number]     Arrival date & time 09/03/13  2018 History   First MD Initiated Contact with Patient 09/03/13 2037     Chief Complaint  Patient presents with  . Fever  . Tachycardia  . Anxiety     (Consider location/radiation/quality/duration/timing/severity/associated sxs/prior Treatment) HPI 53 year old female with metastatic breast cancer to lung and lymph nodes and bones last chemotherapy yesterday and recently had a drain placed due to her chronic left pleural effusion with chronic left-sided chest pain now presents with low-grade fever today with temperature 100.1 orally just prior to arrival with no confusion no rash no headache no cough no shortness breath no abdominal pain no vomiting but did have a couple loose stools today which is typical because she takes softeners no dysuria 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 Past Medical History  Diagnosis Date  . Seizures 2010    Isolated incident.  . Breast cancer dx'd 2005/2011  . PONV (postoperative nausea and vomiting)   . Peripheral vascular disease 02/2010    blood clot related to porta cath   Past Surgical History  Procedure Laterality Date  . Breast lumpectomy  2005  . Axillary lymph node dissection  Dec. 2011  . Portacath placement  12/11  . Removal portacath    . Mediastinotomy chamberlain mcneil Left 06/02/2013    Procedure: MEDIASTINOTOMY CHAMBERLAIN MCNEIL;  Surgeon: [name], MD;  Location: [facility];  Service: Thoracic;  Laterality: Left;  LEFT ANTERIOR MEDIASTINOTOMY    No family history on file. History  Substance Use Topics  . Smoking status: Never Smoker   . Smokeless tobacco: Never Used  . Alcohol Use: No   OB History   Grav Para Term Preterm Abortions TAB SAB Ect Mult Living                 Review of Systems 10 Systems reviewed and are negative for acute change except as noted in the HPI.   Allergies  Decadron;  Hydromorphone hcl; Morphine and related; and Tegaderm ag mesh  Home Medications   Prior to Admission medications   Medication Sig Start Date End Date Taking? Authorizing Provider  PRESCRIPTION MEDICATION eriBULin mesylate (HALAVEN) 2.6 mg in sodium chloride 0.9 % 100 mL chemo infusion 1.4 mg/m2  1.86 m2 (Treatment Plan Actual)  Once 09/02/2013   Yes Historical Provider, MD  ALPRAZolam Duanne Moron) 0.5 MG tablet Take 1 tablet (0.5 mg total) by mouth 2 (two) times daily as needed for anxiety. 08/26/13   Chauncey Cruel, MD  b complex vitamins capsule Take 1 capsule by mouth daily.    Historical Provider, MD  Calcium-Magnesium-Vitamin D (CITRACAL CALCIUM+D PO) Take by mouth. 400mg  vitamin C, 500mg  vitamin D3 2 PO DAILY    Historical Provider, MD  folic acid (FOLVITE) 1 MG tablet Take 1 mg by mouth daily.    Historical Provider, MD  Magnesium 100 MG CAPS Take 250 each by mouth daily. Taking 250 mg per day    Historical Provider, MD  Melatonin 1 MG TABS Take 3 mg by mouth at bedtime as needed (sleep).     Historical Provider, MD  metoCLOPramide (REGLAN) 10 MG tablet Take 1 tablet (10 mg total) by mouth 4 (four) times daily -  before meals and at bedtime. 08/26/13   Chauncey Cruel, MD  naproxen (NAPROSYN) 500 MG tablet Take 500 mg by mouth 3 (three) times daily as needed for  moderate pain.    Historical Provider, MD  naproxen sodium (ANAPROX) 220 MG tablet Take 220 mg by mouth 2 (two) times daily as needed (pain).    Historical Provider, MD  omeprazole (PRILOSEC) 40 MG capsule Take 1 capsule (40 mg total) by mouth daily. 08/26/13   Chauncey Cruel, MD  ondansetron (ZOFRAN) 8 MG tablet Take 1 tablet (8 mg total) by mouth 2 (two) times daily. Start the day after chemo for 2 days. Then take as needed for nausea or vomiting. 08/26/13   Chauncey Cruel, MD  oxyCODONE (OXY IR/ROXICODONE) 5 MG immediate release tablet Take 1-2 tablets (5-10 mg total) by mouth every 4 (four) hours as needed for moderate pain.  06/02/13   [name], MD  palbociclib [facility]) 125 MG capsule Take 1 capsule (125 mg total) by mouth daily with breakfast. Take whole with food. 08/20/13   Chauncey Cruel, MD  VITAMIN D, CHOLECALCIFEROL, PO Take 2,000 Units by mouth daily.     Historical Provider, MD   BP 147/99  Pulse 115  Temp(Src) 97.6 F (36.4 C) (Oral)  Resp 16  SpO2 99% Physical Exam  Nursing note and vitals reviewed. Constitutional:  Awake, alert, nontoxic appearance.  HENT:  Head: Atraumatic.  Eyes: Right eye exhibits no discharge. Left eye exhibits no discharge.  Neck: Neck supple.  Cardiovascular: Regular rhythm.   No murmur heard. Tachycardic  Pulmonary/Chest: Effort normal. No respiratory distress. She has no wheezes. She has no rales. She exhibits no tenderness.  Decreased breath sounds left side pulse oximetry normal room air 96%  Abdominal: Soft. Bowel sounds are normal. She exhibits no distension. There is no tenderness. There is no rebound and no guarding.  Musculoskeletal: She exhibits no edema and no tenderness.  Baseline ROM, no obvious new focal weakness.  Neurological: She is alert.  Mental status and motor strength appears baseline for patient and situation.  Skin: No rash noted.  Psychiatric: She has a normal mood and affect.    ED Course  Procedures (including critical care time) Repeat pulse low 100's. Pt did not want IVF or 2nd BC in ED. Since not neutropenic and Pt states she gets low grade temperature elevation the day after chemo, Pt does not want antibiotics and that appears reasonable. Labs Review Labs Reviewed  CBC WITH DIFFERENTIAL - Abnormal; Notable for the following:    Neutrophils Relative % 85 (*)    Lymphocytes Relative 9 (*)    Lymphs Abs 0.5 (*)    All other components within normal limits  COMPREHENSIVE METABOLIC PANEL - Abnormal; Notable for the following:    Sodium 134 (*)    Glucose, Bld 121 (*)    Albumin 3.3 (*)    AST 55 (*)    ALT 85 (*)     All other components within normal limits  CULTURE, BLOOD (ROUTINE X 2)  URINE CULTURE  CULTURE, BLOOD (ROUTINE X 2)  URINALYSIS, ROUTINE W REFLEX MICROSCOPIC  I-STAT CG4 LACTIC ACID, ED    Imaging Review No results found. Dg Chest 1 View  09/01/2013   CLINICAL DATA:  Left-sided pleural drain placement.  EXAM: CHEST - 1 VIEW  COMPARISON:  May 31, 2013.  FINDINGS: Interval placement of left-sided pleural drain is noted. No pneumothorax is noted. Mild left pleural effusion is noted. Left basilar opacity is noted concerning for atelectasis. Right lung is clear. Surgical clips noted in right axillary region.  IMPRESSION: Interval placement of left-sided pleural drain with mild left  pleural effusion noted and probable left lower lobe atelectasis. No pneumothorax is noted.   Electronically Signed   By: Sabino Dick M.D.   On: 09/01/2013 16:45   Dg Chest 1 View  08/24/2013   CLINICAL DATA:  Status post left thoracentesis  EXAM: CHEST - 1 VIEW  COMPARISON:  08/23/2013  FINDINGS: There is been significant reduction in left-sided pleural effusion with only a small amount of fluid remaining following thoracentesis. No pneumothorax is noted. Mild atelectatic changes are noted in the left mid lung. The right lung remains clear. Postsurgical changes in the right axilla are again noted.  IMPRESSION: No evidence of post thoracentesis pneumothorax. Significant reduction in left pleural effusion is noted.   Electronically Signed   By: Inez Catalina M.D.   On: 08/24/2013 11:48   Dg Chest 1 View  08/16/2013   CLINICAL DATA:  Status post left thoracentesis.  EXAM: CHEST - 1 VIEW  COMPARISON:  PET-CT 08/16/2013. Thoracic spine radiographs 08/04/2013. Chest radiographs 05/31/2013.  FINDINGS: The cardiac silhouette is partially obscured without gross enlargement. There is a small left pleural effusion, greatly decreased in size compared to recent PET-CT. There is a small loculated pneumothorax at the left lung base. Opacity  in the left mid to lower lung likely represents atelectasis. The right lung is clear. Surgical clips are present in the right axilla.  IMPRESSION: 1. Small left pleural effusion, greatly decreased in size following thoracentesis. 2. Loculated gas collection at the left lung base consistent with a small ex vacuo pneumothorax. 3. Left basilar parenchymal opacity, likely reflecting atelectasis. These results were communicated to Dr. Jacqulynn Cadet at the time of dictation.   Electronically Signed   By: Logan Bores   On: 08/16/2013 17:05   Dg Chest 2 View  09/03/2013   CLINICAL DATA:  Fever and tachycardia  EXAM: CHEST  2 VIEW  COMPARISON:  September 01, 2013  FINDINGS: There is increased left base consolidation with small left effusion. Right lung is clear. Heart is mildly enlarged with normal pulmonary vascularity. No adenopathy. There is a chest tube on the left without appreciable pneumothorax. There are surgical clips in the right axillary region. No bone lesions.  IMPRESSION: Chest tube again noted on the left. No pneumothorax. Increase in left base consolidation with small left effusion. Right lung clear. No change in cardiac silhouette.   Electronically Signed   By: Lowella Grip M.D.   On: 09/03/2013 21:27   Dg Chest 2 View  08/30/2013   CLINICAL DATA:  Breast cancer with metastasis. Cough. Pleural effusion.  EXAM: CHEST  2 VIEW  COMPARISON:  08/24/2013  FINDINGS: Lateral view degraded by patient arm position. Right axillary node dissection. S-shaped thoracolumbar spine curvature. Mild cardiomegaly. Moderate left pleural effusion is increased. No pneumothorax. Right lung clear. Left lower lobe airspace disease is increased.  IMPRESSION: Increased left-sided pleural fluid, moderate. Adjacent airspace disease in the left lower lobe is also increased.  Cardiomegaly without congestive failure.   Electronically Signed   By: Abigail Miyamoto M.D.   On: 08/30/2013 10:53   Dg Chest 2 View  08/23/2013   CLINICAL  DATA:  Recent pneumothorax related to thoracentesis  EXAM: CHEST  2 VIEW  COMPARISON:  08/19/2013  FINDINGS: Enlarging moderate to large left pleural effusion noted with lingula and left lower lobe collapse/ consolidation compared to the prior study. No current pneumothorax. Stable right lung aeration. Previous breast surgery and axillary lymph node dissection on the right. Trachea is midline.  IMPRESSION: Enlarging left pleural effusion with lingula and left lower lobe collapse/consolidation.  No current pneumothorax.   Electronically Signed   By: Daryll Brod M.D.   On: 08/23/2013 14:19   Dg Chest 2 View  08/19/2013   CLINICAL DATA:  Recent thoracentesis, increasing pain in the left axillary region and under the left breast  EXAM: CHEST  2 VIEW  COMPARISON:  DG CHEST 1 VIEW dated 08/16/2013; DG THORACIC SPINE dated 08/04/2013  FINDINGS: There is persistent pleural effusion on the left not greatly changed since the earlier study. There is no pneumothorax or pneumomediastinum. Atelectatic change in the left mid lung has improved. The right lung is clear. The cardiopericardial silhouette is normal in size. The observed bony thorax is normal.  IMPRESSION: Stable left pleural effusion; improved left-sided atelectasis, no pneumothorax.   Electronically Signed   By: David  Martinique   On: 08/19/2013 08:24   Ir Lenise Arena W Catheter Placement  09/01/2013   CLINICAL DATA:  History breast cancer, now with recurrent symptomatic left-sided pleural effusion.  EXAM: INSERTION OF TUNNELED LEFT SIDED PLEURAL DRAINAGE CATHETER  COMPARISON:  Ultrasound-guided paracentesis - 08/24/2013; 08/16/2013; PET-CT - 08/16/2013  MEDICATIONS: Ancef 2 gram IV; Antibiotic was administered in an appropriate time interval for the procedure.  ANESTHESIA/SEDATION: Versed 3 mg IV; Fentanyl 200 mcg IV  Total Moderate Sedation Time  25 minutes.  FLUOROSCOPY TIME:  FLUOROSCOPY TIME 36 seconds  COMPLICATIONS: None immediate  PROCEDURE: The procedure,  risks, benefits, and alternatives were explained to the patient, who wish to proceed with the placement of this permanent pleural catheter. The patient understands and consents to the procedure.  The left lateral chest and upper abdomen were prepped with Chlorhexidine in a sterile fashion, and a sterile drape was applied covering the operative field. A sterile gown and sterile gloves were used for the procedure. Initial ultrasound scanning and fluoroscopic imaging demonstrates a recurrent moderate to large pleural effusion.  Under direct ultrasound guidance, the left inferior lateral pleural space was accessed with a Yueh sheath needle after the overlying soft tissues were anesthetized with 1% lidocaine with epinephrine. An Amplatz super stiff wire was then advanced under fluoroscopy into the pleural space.  A 15.5 French tunneled Pleur-X catheter was tunneled from an incision within the right upper abdominal quadrant to the access site. The pleural access site was serially dilated under fluoroscopy, ultimately allowing placement of a peel-away sheath. The catheter was advanced through the peel-away sheath. The sheath was then removed. Final catheter positioning was confirmed with a fluoroscopic radiographic image.  The access incision was closed with subcutaneous subcuticular 4-0 Vicryl, Dermabond and Steri-Strips. A Prolene retention suture was applied at the catheter exit site. Large volume thoracentesis was performed through the new catheter utilizing provided bulb vacuum assisted drainage bag. The patient tolerated the above procedure well without immediate postprocedural complication.  FINDINGS: Preprocedural ultrasound scanning demonstrates a recurrent moderate to large sized left sided pleural effusion.  After ultrasound and fluoroscopic guided placement, the catheter is directed towards the left lung apex.  Following catheter placement, approximately 1 mL of serous pleural fluid was removed.  IMPRESSION:  Successful placement of permanent, tunneled left pleural drainage catheter via lateral approach. Approximately 1 liter of serous pleural fluid was removed after catheter placement.   Electronically Signed   By: Sandi Mariscal M.D.   On: 09/01/2013 15:58   Nm Pet Image Restag (ps) Skull Base To Thigh  08/16/2013   ADDENDUM REPORT: 08/16/2013 11:14  ADDENDUM: The  original report was by Dr. Van Clines. The following addendum is by Dr. Van Clines:  These results were called by telephone at the time of interpretation on 08/16/2013 at 11:14 AM to Dr. Lurline Del , who verbally acknowledged these results.   Electronically Signed   By: Sherryl Barters M.D.   On: 08/16/2013 11:14   08/16/2013   CLINICAL DATA:  Subsequent treatment strategy for breast cancer. New symptoms of pain and cough.  EXAM: NUCLEAR MEDICINE PET SKULL BASE TO THIGH  TECHNIQUE: 8.7 mCi F-18 FDG was injected intravenously. Full-ring PET imaging was performed from the skull base to thigh after the radiotracer. CT data was obtained and used for attenuation correction and anatomic localization.  FASTING BLOOD GLUCOSE:  Value: 90 mg/dl  COMPARISON:  01/10/2013  FINDINGS: NECK  Symmetric laryngeal activity and symmetric salivary gland activity, likely physiologic.  CHEST  Extensive left pleural metastatic disease. Index pleural rind post for medially along the left hemidiaphragmatic border with maximum standard uptake value 13.6. Confluent prevascular and adjacent pleural based mass short axis diameter 2.7 cm, maximum standard uptake value 13.9. Large left pleural effusion encompasses at least 75% of the left hemithorax. Cardiac and mediastinal structures are shifted to the right.  ABDOMEN/PELVIS  Adenopathy just above the celiac trunk measures up to 1.3 cm in short axis with conglomerate maximum standard uptake value 8.8. Nodal metastatic disease posterior to the left adrenal gland in the left periaortic region with individual node short  axis diameter 1.1 cm and conglomerate maximum standard uptake value of 14.1.  Ancillary findings of hepatic steatosis, non hypermetabolic but a hypodense lesion in segment 4B be of the liver, and levoconvex lumbar scoliosis.  SKELETON  Left medial clavicular metastatic lesion, maximum standard uptake value 7.5.  IMPRESSION: 1. Extensive multifocal left pleural metastatic disease with hypermetabolic prevascular and upper abdominal retroperitoneal adenopathy. 2. Metastatic lesion in the left medial clavicle. 3. Large left pleural effusion with shift of cardiac and mediastinal structures to the right. There are described cases of tension hydrothorax in patients with malignant pleural effusions, and given the mediastinal shift without explaining atelectasis in the right lung, this is a possibility in this patient. 4. Hepatic steatosis.  Electronically Signed: By: Sherryl Barters M.D. On: 08/16/2013 10:50   US Thoracentesis Asp Pleural Space W/img Guide  08/24/2013   CLINICAL DATA:  Malignant pleural effusion, shortness of breath, breast cancer  EXAM: ULTRASOUND GUIDED LEFT THORACENTESIS  COMPARISON:  08/16/2013  PROCEDURE: An ultrasound guided thoracentesis was thoroughly discussed with the patient and questions answered. The benefits, risks, alternatives and complications were also discussed. The patient understands and wishes to proceed with the procedure. Written consent was obtained.  Ultrasound was performed to localize and mark an adequate pocket of fluid in the left chest. The area was then prepped and draped in the normal sterile fashion. 1% Lidocaine was used for local anesthesia. Under ultrasound guidance a 19 gauge Yueh catheter was introduced. Thoracentesis was performed. The catheter was removed and a dressing applied.  Complications:  No immediate  FINDINGS: A total of approximately 1 L of serosanguineous pleural fluid was removed. A fluid sample was notsent for laboratory analysis.  IMPRESSION:  Successful ultrasound guided left thoracentesis yielding 1 L of pleural fluid.   Electronically Signed   By: Daryll Brod M.D.   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 She presents for therapeutic and diagnostic thoracentesis.  EXAM: US THORACENTESIS ASP PLEURAL SPACE W/IMG GUIDE  Date: 08/16/2013  PROCEDURE: 1. Ultrasound-guided left thoracentesis Interventional Radiologist:  Criselda Peaches, MD  ANESTHESIA/SEDATION: None required  TECHNIQUE: Informed consent was obtained from the patient following explanation of the procedure, risks, benefits and alternatives. The patient understands, agrees and consents for the procedure. All questions were addressed. A time out was performed.  The left posterior thorax was interrogated with ultrasound. There is a large pleural effusion. Inferiorly, there is a thickened pleural rind consistent with the known pleural implants. A suitable window in the more superior mid scapular line was selected.  The region was prepped and draped in the standard fashion using Betadine. Local anesthesia was attained by infiltration with 1% lidocaine. A small dermatotomy was made. Under real-time sonographic guidance, a 5 [place] Yueh centesis catheter was advanced over rhythm into these pleural fluid. Approximately 1.5 L of clear yellow pleural fluid was then hand aspirated. The fluid was sent for cytology.  A sterile Band-Aid was applied. The patient tolerated the procedure well; there was no immediate complication.  IMPRESSION: Ultrasound-guided left thoracentesis with aspiration of approximately 1.5 L of clear yellow pleural fluid.  Post thoracentesis text x-ray demonstrates a small basilar pneumothorax ex vacuo likely secondary to incomplete re-expansion of the lung.  Signed,  Criselda Peaches, MD   [facility]  [facility]   Electronically Signed   By: Jacqulynn Cadet M.D.   On: 08/16/2013 17:08   EKG Interpretation None      MDM   Final diagnoses:  Fever  Breast cancer metastasized to multiple sites    I doubt any other EMC precluding discharge at this time including, but not necessarily limited to the following:septic shock.    Babette Relic, MD 09/06/13 [phone]

## 2013-09-03 NOTE — ED Notes (Signed)
Patient refused second set of blood cultures at this time.

## 2013-09-03 NOTE — ED Notes (Signed)
Pt presents with c/o fever, pt is a cancer patient. Pt says she took her temperature at home and it was 100.1 and she called the cancer center on-call and was told to come here. Pt is afebrile at this time in triage. Pt is also very tachycardic at 139HR, says that she is very anxious at this time. Pt also c/o some nausea at this time and says that she has had diarrhea from 11 this morning until about 12:30pm. Pt took dulcolax last night after her chemo treatment yesterday.

## 2013-09-03 NOTE — ED Notes (Signed)
Patient refused EKG for high heart rate. Patient would like something for" her nerves, please."

## 2013-09-03 NOTE — ED Notes (Signed)
Patient is requesting that I return in about 10-15 minutes to draw blood because she is not ready at this time.

## 2013-09-03 NOTE — Discharge Instructions (Signed)
SEEK MEDICAL CARE IF: You have repeated vomiting, unable to take fluids, breathing problems, a severe headache, confusion, a new rash, abdominal pain, difficulty urinating, become poorly responsive, or any new symptoms.

## 2013-09-05 ENCOUNTER — Other Ambulatory Visit: Payer: Self-pay

## 2013-09-05 ENCOUNTER — Telehealth: Payer: Self-pay | Admitting: *Deleted

## 2013-09-05 ENCOUNTER — Ambulatory Visit: Payer: 59 | Admitting: Physical Therapy

## 2013-09-05 NOTE — Telephone Encounter (Signed)
Patient calling today for more suggestions on how to deal with the pain following drainages. Patient is reluctant to taking many meds. She states she has spasms and pain after the draining, and then coughing. Unfortunately this is common side effects of having pleural effusions drained. Suggested that patient try an Ibuprofen for inflammation type pain. She can also try heat and ice to sore areas for relief. Patient understands to call us if she develops any fever or uncontrollable pain.

## 2013-09-05 NOTE — Telephone Encounter (Signed)
Patient called and left message that she has had coldness in toes and some neuropathy, numbness, but not like needles. Called back to discuss further with patient, she did not answer so I left message to call me back, but I would also make sure I spoke with her today before leaving.

## 2013-09-06 ENCOUNTER — Ambulatory Visit (HOSPITAL_COMMUNITY): Payer: 59

## 2013-09-07 ENCOUNTER — Ambulatory Visit: Payer: 59 | Admitting: Physical Therapy

## 2013-09-07 LAB — URINE CULTURE

## 2013-09-08 ENCOUNTER — Other Ambulatory Visit: Payer: Self-pay | Admitting: *Deleted

## 2013-09-08 ENCOUNTER — Telehealth: Payer: Self-pay | Admitting: Oncology

## 2013-09-08 ENCOUNTER — Telehealth: Payer: Self-pay | Admitting: *Deleted

## 2013-09-08 ENCOUNTER — Ambulatory Visit: Payer: Self-pay

## 2013-09-08 ENCOUNTER — Other Ambulatory Visit (HOSPITAL_BASED_OUTPATIENT_CLINIC_OR_DEPARTMENT_OTHER): Payer: 59

## 2013-09-08 ENCOUNTER — Ambulatory Visit (HOSPITAL_BASED_OUTPATIENT_CLINIC_OR_DEPARTMENT_OTHER): Payer: 59 | Admitting: Oncology

## 2013-09-08 VITALS — BP 150/95 | HR 121 | Temp 98.3°F | Resp 18 | Ht 63.0 in | Wt 171.6 lb

## 2013-09-08 DIAGNOSIS — R309 Painful micturition, unspecified: Secondary | ICD-10-CM

## 2013-09-08 DIAGNOSIS — C50211 Malignant neoplasm of upper-inner quadrant of right female breast: Secondary | ICD-10-CM

## 2013-09-08 DIAGNOSIS — J90 Pleural effusion, not elsewhere classified: Secondary | ICD-10-CM

## 2013-09-08 DIAGNOSIS — M549 Dorsalgia, unspecified: Secondary | ICD-10-CM

## 2013-09-08 DIAGNOSIS — C773 Secondary and unspecified malignant neoplasm of axilla and upper limb lymph nodes: Secondary | ICD-10-CM

## 2013-09-08 DIAGNOSIS — R350 Frequency of micturition: Secondary | ICD-10-CM

## 2013-09-08 DIAGNOSIS — I890 Lymphedema, not elsewhere classified: Secondary | ICD-10-CM

## 2013-09-08 DIAGNOSIS — C50919 Malignant neoplasm of unspecified site of unspecified female breast: Secondary | ICD-10-CM

## 2013-09-08 DIAGNOSIS — R5381 Other malaise: Secondary | ICD-10-CM

## 2013-09-08 DIAGNOSIS — C782 Secondary malignant neoplasm of pleura: Secondary | ICD-10-CM

## 2013-09-08 DIAGNOSIS — M899 Disorder of bone, unspecified: Secondary | ICD-10-CM

## 2013-09-08 DIAGNOSIS — K7689 Other specified diseases of liver: Secondary | ICD-10-CM

## 2013-09-08 DIAGNOSIS — M949 Disorder of cartilage, unspecified: Secondary | ICD-10-CM

## 2013-09-08 DIAGNOSIS — R5383 Other fatigue: Secondary | ICD-10-CM

## 2013-09-08 DIAGNOSIS — K117 Disturbances of salivary secretion: Secondary | ICD-10-CM

## 2013-09-08 DIAGNOSIS — I82C19 Acute embolism and thrombosis of unspecified internal jugular vein: Secondary | ICD-10-CM

## 2013-09-08 LAB — CBC WITH DIFFERENTIAL/PLATELET
BASO%: 1 % (ref 0.0–2.0)
Basophils Absolute: 0 10*3/uL (ref 0.0–0.1)
EOS ABS: 0.1 10*3/uL (ref 0.0–0.5)
EOS%: 2.9 % (ref 0.0–7.0)
HEMATOCRIT: 39.6 % (ref 34.8–46.6)
HGB: 13.6 g/dL (ref 11.6–15.9)
LYMPH%: 23.3 % (ref 14.0–49.7)
MCH: 31.3 pg (ref 25.1–34.0)
MCHC: 34.3 g/dL (ref 31.5–36.0)
MCV: 91.1 fL (ref 79.5–101.0)
MONO#: 0.1 10*3/uL (ref 0.1–0.9)
MONO%: 5.5 % (ref 0.0–14.0)
NEUT%: 67.3 % (ref 38.4–76.8)
NEUTROS ABS: 1.7 10*3/uL (ref 1.5–6.5)
PLATELETS: 318 10*3/uL (ref 145–400)
RBC: 4.34 10*6/uL (ref 3.70–5.45)
RDW: 12.7 % (ref 11.2–14.5)
WBC: 2.5 10*3/uL — ABNORMAL LOW (ref 3.9–10.3)
lymph#: 0.6 10*3/uL — ABNORMAL LOW (ref 0.9–3.3)

## 2013-09-08 LAB — COMPREHENSIVE METABOLIC PANEL (CC13)
ALK PHOS: 104 U/L (ref 40–150)
ALT: 292 U/L (ref 0–55)
ANION GAP: 11 meq/L (ref 3–11)
AST: 191 U/L (ref 5–34)
Albumin: 3.2 g/dL — ABNORMAL LOW (ref 3.5–5.0)
BILIRUBIN TOTAL: 0.4 mg/dL (ref 0.20–1.20)
BUN: 5.6 mg/dL — ABNORMAL LOW (ref 7.0–26.0)
CO2: 24 meq/L (ref 22–29)
CREATININE: 0.8 mg/dL (ref 0.6–1.1)
Calcium: 9.1 mg/dL (ref 8.4–10.4)
Chloride: 104 mEq/L (ref 98–109)
GLUCOSE: 155 mg/dL — AB (ref 70–140)
Potassium: 3.7 mEq/L (ref 3.5–5.1)
SODIUM: 139 meq/L (ref 136–145)
Total Protein: 6.7 g/dL (ref 6.4–8.3)

## 2013-09-08 LAB — URINALYSIS, MICROSCOPIC - CHCC
BILIRUBIN (URINE): NEGATIVE
GLUCOSE UR CHCC: NEGATIVE mg/dL
KETONES: NEGATIVE mg/dL
Nitrite: NEGATIVE
Protein: NEGATIVE mg/dL
SPECIFIC GRAVITY, URINE: 1.01 (ref 1.003–1.035)
Urobilinogen, UR: 0.2 mg/dL (ref 0.2–1)
pH: 6 (ref 4.6–8.0)

## 2013-09-08 MED ORDER — CIPROFLOXACIN HCL 500 MG PO TABS: 500.0000 mg | ORAL_TABLET | Freq: Two times a day (BID) | ORAL | Status: DC

## 2013-09-08 MED ORDER — DEXAMETHASONE 4 MG PO TABS: 8.0000 mg | ORAL_TABLET | Freq: Two times a day (BID) | ORAL | Status: DC

## 2013-09-08 MED ORDER — METAXALONE 800 MG PO TABS: 400.0000 mg | ORAL_TABLET | Freq: Three times a day (TID) | ORAL | Status: DC | PRN

## 2013-09-08 NOTE — Telephone Encounter (Signed)
per pof to sch pt lab appt before appt 6/15-adv pt of time & date-printed copy of sch

## 2013-09-08 NOTE — Progress Notes (Signed)
[place]  [phone] [phone]     ID: [name] OB: January 15, 1961  MR#: [id number]  LOV#:[id number]  PCP: Pcp Not In System GYN:  [name] SU:  OTHER MD: [name], [name]  CHIEF COMPLAINT: Stage IV breast cancer CURRENT TREATMENT: chemotherapy  BREAST CANCER HISTORY: From doctor [name]'s intake note 03/20/2004:  "The patient is a very pleasant 53 year old female, without significant past medical history.  Her family history is significant for a sister who at age 59 was diagnosed with invasive ductal carcinoma.  She is a breast cancer survivor at age 39 now.  The patient states that she has never really had a screening mammogram until October 2005, when she felt that it was time for her to start having mammograms done on a yearly basis.  Therefore, on 01/26/04, she underwent a screening mammogram and an abnormality was detected in the upper outer right breast.  She, therefore, underwent spot compression views of both the right and the left breast.  The left breast revealed a well-defined mass in the upper outer left quadrant, present at the 2 o'clock position, measuring 1.8 cm, 6 cm from the nipple.  This, by ultrasound, was felt to be a simple cyst measuring 1.8 cm.  On the right breast, a spiculated mass was noted in the upper outer right quadrant.  The ultrasound revealed a shadowing irregular solid mass at the 10:30 position, 9 cm from the nipple, measuring 1.2 cm in greatest dimension, correlating with the spiculated mass seen on the mammogram.  The right axilla was negative ultrasonically.  Because of this, the patient underwent a needle biopsy of the right breast and the biopsy was positive invasive mammary carcinoma that showed features consistent with a high-grade invasive ductal carcinoma associated with desmoplastic stroma.  No in situ component was seen and no definite lymphovascular invasion was identified.  On the core  biopsy, the tumor measured about 0.8 cm.  Because of this, she was seen by Dr. [name] and the patient was taken to the [facility] on March 15, 2004.  She underwent a right breast lumpectomy with sentinel node biopsy.  The final pathology revealed an invasive ductal carcinoma, measuring 1.7 cm, grade 2 of 3.  Margins were free of tumor.  Atypical lobular hyperplasia was noted.  One sentinel node was removed which was negative for metastatic disease.  The tumor was staged at T1c, N0 MX.  It was estrogen receptor positive, progesterone receptor positive.  HER-2/neu was 2+.  FISH was negative.  All margins were free of tumor.  She is now seen in Medical Oncology for further evaluation and management of this newly diagnosed T1c, node negative, stage I, invasive ductal carcinoma of the right breast."  Her subsequent history is as detailed below  INTERVAL HISTORY: Jammie returns today for followup of her breast cancer accompanied by her husband [name]. Today is day 8 cycle 1 of her eribulin. The past week has been "not good". She had of course continuing pain related to her left Pleuryx and drainage issues. When she gets the pleural fluid drained she gets faint and she gets pain in her sternum. She has to lie down. In addition she developed shivers on day 3 of treatment, with numbness and coldness in the fourth and fifth toes of both feet. She had a temperature of 100.1. This took her to the emergency room, where she was found to be afebrile, and we are repeat chest x-ray and EKG  were unremarkable. In addition she had significant constipation with hard bowel movements causing some bleeding. The nausea was very poorly controlled despite the Zofran. She had "metal mouth". She gets cramps in the upper left chest. She vomited at least once and feels very nauseous this morning. In short she had a variety of symptoms either related to chemotherapy, or 2 for left effusion and its drainage, or to the supportive meds,  which is going to make it very difficult to continue with this treatment.  REVIEW OF SYSTEMS: In addition to the problems noted above, she's had mild epistaxis, poor appetite, continuing pain in her back, not related to the drainage, dry mouth, fatigue, and cramps. A detailed review of systems was otherwise stable   PAST MEDICAL HISTORY: Past Medical History  Diagnosis Date  . Seizures 2010    Isolated incident.  . Breast cancer dx'd 2005/2011  . PONV (postoperative nausea and vomiting)   . Peripheral vascular disease 02/2010    blood clot related to porta cath    PAST SURGICAL HISTORY: Past Surgical History  Procedure Laterality Date  . Breast lumpectomy  2005  . Axillary lymph node dissection  Dec. 2011  . Portacath placement  12/11  . Removal portacath    . Mediastinotomy chamberlain mcneil Left 06/02/2013    Procedure: MEDIASTINOTOMY CHAMBERLAIN MCNEIL;  Surgeon: [name], MD;  Location: [facility];  Service: Thoracic;  Laterality: Left;  LEFT ANTERIOR MEDIASTINOTOMY     FAMILY HISTORY No family history on file. The patient's father is living, 76 years old as of may 2015. He lives in [place]. The patient's mother died from complications of COPD at the age of 15. These has 2 brothers, one sister. Her sister developed breast cancer at the age of 44. She is doing well. The patient herself underwent genetic testing at [facility] in 2011 and was found to be BRCA negative  GYNECOLOGIC HISTORY:  Menarche age 57, she is GX P0. She stopped having periods with her initial chemotherapy in 2006.  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 Her husband [name] a Medical illustrator in [place]. He has a child from a prior marriage. At home they have 2 rescue talks, [place] and [place]. The patient is religious but not a church attender    ADVANCED DIRECTIVES:    HEALTH MAINTENANCE: History  Substance Use Topics  .  Smoking status: Never Smoker   . Smokeless tobacco: Never Used  . Alcohol Use: No     Colonoscopy:  PAP:  Bone density:  Lipid panel:  Allergies  Allergen Reactions  . Decadron [Dexamethasone] Other (See Comments)    Patient does not tolerate steroids.   . Hydromorphone Hcl Nausea And Vomiting  . Morphine And Related   . Tegaderm Ag Mesh [Silver]     Current Outpatient Prescriptions  Medication Sig Dispense Refill  . ALPRAZolam (XANAX) 0.5 MG tablet Take 1 tablet (0.5 mg total) by mouth 2 (two) times daily as needed for anxiety.  30 tablet  0  . b complex vitamins capsule Take 1 capsule by mouth daily.      . Calcium-Magnesium-Vitamin D (CITRACAL CALCIUM+D PO) Take by mouth. 427m vitamin C, 5068mvitamin D3 2 PO DAILY      . folic acid (FOLVITE) 1 MG tablet Take 1 mg by mouth daily.      . Magnesium 100 MG CAPS Take 250 each by mouth  daily. Taking 250 mg per day      . Melatonin 1 MG TABS Take 3 mg by mouth at bedtime as needed (sleep).       . metoCLOPramide (REGLAN) 10 MG tablet Take 1 tablet (10 mg total) by mouth 4 (four) times daily -  before meals and at bedtime.  60 tablet  3  . naproxen (NAPROSYN) 500 MG tablet Take 500 mg by mouth 3 (three) times daily as needed for moderate pain.      . naproxen sodium (ANAPROX) 220 MG tablet Take 220 mg by mouth 2 (two) times daily as needed (pain).      Marland Kitchen omeprazole (PRILOSEC) 40 MG capsule Take 1 capsule (40 mg total) by mouth daily.  30 capsule  12  . ondansetron (ZOFRAN) 8 MG tablet Take 1 tablet (8 mg total) by mouth 2 (two) times daily. Start the day after chemo for 2 days. Then take as needed for nausea or vomiting.  30 tablet  1  . oxyCODONE (OXY IR/ROXICODONE) 5 MG immediate release tablet Take 1-2 tablets (5-10 mg total) by mouth every 4 (four) hours as needed for moderate pain.  30 tablet  0  . palbociclib (IBRANCE) 125 MG capsule Take 1 capsule (125 mg total) by mouth daily with breakfast. Take whole with food.  21 capsule  12   . PRESCRIPTION MEDICATION eriBULin mesylate (HALAVEN) 2.6 mg in sodium chloride 0.9 % 100 mL chemo infusion 1.4 mg/m2  1.86 m2 (Treatment Plan Actual)  Once 09/02/2013      . VITAMIN D, CHOLECALCIFEROL, PO Take 2,000 Units by mouth daily.        No current facility-administered medications for this visit.    OBJECTIVE: Middle-aged white woman in moderate distress Filed Vitals:   09/08/13 0837  BP: 150/95  Pulse: 121  Temp: 98.3 F (36.8 C)  Resp: 18     Body mass index is 30.41 kg/(m^2).      ECOG FS:1 - Symptomatic but completely ambulatory  Ocular: Sclerae unicteric, pupils round and equal Ear-nose-throat: Oropharynx shows 2 small red spots in good upper palate, no other lesions Lymphatic: No cervical or supraclavicular adenopathy Lungs no rales or rhonchi, dullness to percussion in left base Heart regular rate and rhythm Abd soft, nontender, positive bowel sounds MSK no focal spinal tenderness; Pleurx catheter left flank Neuro: non-focal, well-oriented, anxious and depressed affect Breasts: Deferred  LAB RESULTS:  CMP     Component Value Date/Time   NA 134* 09/03/2013 2145   NA 139 08/04/2013 1327   K 3.7 09/03/2013 2145   K 3.7 08/04/2013 1327   CL 97 09/03/2013 2145   CL 105 05/06/2012 1333   CO2 23 09/03/2013 2145   CO2 25 08/04/2013 1327   GLUCOSE 121* 09/03/2013 2145   GLUCOSE 100 08/04/2013 1327   GLUCOSE 124* 05/06/2012 1333   BUN 8 09/03/2013 2145   BUN 11.8 08/04/2013 1327   CREATININE 0.64 09/03/2013 2145   CREATININE 0.9 08/04/2013 1327   CALCIUM 9.0 09/03/2013 2145   CALCIUM 10.1 08/04/2013 1327   PROT 6.8 09/03/2013 2145   PROT 7.2 08/04/2013 1327   ALBUMIN 3.3* 09/03/2013 2145   ALBUMIN 3.8 08/04/2013 1327   AST 55* 09/03/2013 2145   AST 41* 08/04/2013 1327   ALT 85* 09/03/2013 2145   ALT 81* 08/04/2013 1327   ALKPHOS 114 09/03/2013 2145   ALKPHOS 81 08/04/2013 1327   BILITOT 0.3 09/03/2013 2145   BILITOT 0.28 08/04/2013 1327  GFRNONAA >90 09/03/2013 2145   GFRAA >90 09/03/2013 2145    I No  results found for this basename: SPEP,  UPEP,   kappa and lambda light chains    Lab Results  Component Value Date   WBC 2.5* 09/08/2013   NEUTROABS 1.7 09/08/2013   HGB 13.6 09/08/2013   HCT 39.6 09/08/2013   MCV 91.1 09/08/2013   PLT 318 09/08/2013      Chemistry      Component Value Date/Time   NA 134* 09/03/2013 2145   NA 139 08/04/2013 1327   K 3.7 09/03/2013 2145   K 3.7 08/04/2013 1327   CL 97 09/03/2013 2145   CL 105 05/06/2012 1333   CO2 23 09/03/2013 2145   CO2 25 08/04/2013 1327   BUN 8 09/03/2013 2145   BUN 11.8 08/04/2013 1327   CREATININE 0.64 09/03/2013 2145   CREATININE 0.9 08/04/2013 1327      Component Value Date/Time   CALCIUM 9.0 09/03/2013 2145   CALCIUM 10.1 08/04/2013 1327   ALKPHOS 114 09/03/2013 2145   ALKPHOS 81 08/04/2013 1327   AST 55* 09/03/2013 2145   AST 41* 08/04/2013 1327   ALT 85* 09/03/2013 2145   ALT 81* 08/04/2013 1327   BILITOT 0.3 09/03/2013 2145   BILITOT 0.28 08/04/2013 1327       Lab Results  Component Value Date   LABCA2 25 11/02/2007    No components found with this basename: LABCA125     Recent Labs Lab 09/01/13 1330  INR 1.00    Urinalysis    Component Value Date/Time   COLORURINE YELLOW 09/03/2013 2109   APPEARANCEUR CLEAR 09/03/2013 2109   LABSPEC 1.016 09/03/2013 2109   PHURINE 6.0 09/03/2013 2109   GLUCOSEU NEGATIVE 09/03/2013 2109   HGBUR NEGATIVE 09/03/2013 2109   BILIRUBINUR NEGATIVE 09/03/2013 2109   [place] NEGATIVE 09/03/2013 2109   PROTEINUR NEGATIVE 09/03/2013 2109   UROBILINOGEN 0.2 09/03/2013 2109   NITRITE NEGATIVE 09/03/2013 2109   LEUKOCYTESUR NEGATIVE 09/03/2013 2109    STUDIES: Dg Chest 1 View  09/01/2013   CLINICAL DATA:  Left-sided pleural drain placement.  EXAM: CHEST - 1 VIEW  COMPARISON:  May 31, 2013.  FINDINGS: Interval placement of left-sided pleural drain is noted. No pneumothorax is noted. Mild left pleural effusion is noted. Left basilar opacity is noted concerning for atelectasis. Right lung is clear. Surgical clips noted in right  axillary region.  IMPRESSION: Interval placement of left-sided pleural drain with mild left pleural effusion noted and probable left lower lobe atelectasis. No pneumothorax is noted.   Electronically Signed   By: Sabino Dick M.D.   On: 09/01/2013 16:45   Dg Chest 1 View  08/24/2013   CLINICAL DATA:  Status post left thoracentesis  EXAM: CHEST - 1 VIEW  COMPARISON:  08/23/2013  FINDINGS: There is been significant reduction in left-sided pleural effusion with only a small amount of fluid remaining following thoracentesis. No pneumothorax is noted. Mild atelectatic changes are noted in the left mid lung. The right lung remains clear. Postsurgical changes in the right axilla are again noted.  IMPRESSION: No evidence of post thoracentesis pneumothorax. Significant reduction in left pleural effusion is noted.   Electronically Signed   By: Inez Catalina M.D.   On: 08/24/2013 11:48   Dg Chest 1 View  08/16/2013   CLINICAL DATA:  Status post left thoracentesis.  EXAM: CHEST - 1 VIEW  COMPARISON:  PET-CT 08/16/2013. Thoracic spine radiographs 08/04/2013. Chest radiographs 05/31/2013.  FINDINGS: The cardiac silhouette is partially obscured without gross enlargement. There is a small left pleural effusion, greatly decreased in size compared to recent PET-CT. There is a small loculated pneumothorax at the left lung base. Opacity in the left mid to lower lung likely represents atelectasis. The right lung is clear. Surgical clips are present in the right axilla.  IMPRESSION: 1. Small left pleural effusion, greatly decreased in size following thoracentesis. 2. Loculated gas collection at the left lung base consistent with a small ex vacuo pneumothorax. 3. Left basilar parenchymal opacity, likely reflecting atelectasis. These results were communicated to Dr. Jacqulynn Cadet at the time of dictation.   Electronically Signed   By: Logan Bores   On: 08/16/2013 17:05   Dg Chest 2 View  09/03/2013   CLINICAL DATA:  Fever and  tachycardia  EXAM: CHEST  2 VIEW  COMPARISON:  September 01, 2013  FINDINGS: There is increased left base consolidation with small left effusion. Right lung is clear. Heart is mildly enlarged with normal pulmonary vascularity. No adenopathy. There is a chest tube on the left without appreciable pneumothorax. There are surgical clips in the right axillary region. No bone lesions.  IMPRESSION: Chest tube again noted on the left. No pneumothorax. Increase in left base consolidation with small left effusion. Right lung clear. No change in cardiac silhouette.   Electronically Signed   By: Lowella Grip M.D.   On: 09/03/2013 21:27   Dg Chest 2 View  08/30/2013   CLINICAL DATA:  Breast cancer with metastasis. Cough. Pleural effusion.  EXAM: CHEST  2 VIEW  COMPARISON:  08/24/2013  FINDINGS: Lateral view degraded by patient arm position. Right axillary node dissection. S-shaped thoracolumbar spine curvature. Mild cardiomegaly. Moderate left pleural effusion is increased. No pneumothorax. Right lung clear. Left lower lobe airspace disease is increased.  IMPRESSION: Increased left-sided pleural fluid, moderate. Adjacent airspace disease in the left lower lobe is also increased.  Cardiomegaly without congestive failure.   Electronically Signed   By: Abigail Miyamoto M.D.   On: 08/30/2013 10:53   Dg Chest 2 View  08/23/2013   CLINICAL DATA:  Recent pneumothorax related to thoracentesis  EXAM: CHEST  2 VIEW  COMPARISON:  08/19/2013  FINDINGS: Enlarging moderate to large left pleural effusion noted with lingula and left lower lobe collapse/ consolidation compared to the prior study. No current pneumothorax. Stable right lung aeration. Previous breast surgery and axillary lymph node dissection on the right. Trachea is midline.  IMPRESSION: Enlarging left pleural effusion with lingula and left lower lobe collapse/consolidation.  No current pneumothorax.   Electronically Signed   By: Daryll Brod M.D.   On: 08/23/2013 14:19   Dg  Chest 2 View  08/19/2013   CLINICAL DATA:  Recent thoracentesis, increasing pain in the left axillary region and under the left breast  EXAM: CHEST  2 VIEW  COMPARISON:  DG CHEST 1 VIEW dated 08/16/2013; DG THORACIC SPINE dated 08/04/2013  FINDINGS: There is persistent pleural effusion on the left not greatly changed since the earlier study. There is no pneumothorax or pneumomediastinum. Atelectatic change in the left mid lung has improved. The right lung is clear. The cardiopericardial silhouette is normal in size. The observed bony thorax is normal.  IMPRESSION: Stable left pleural effusion; improved left-sided atelectasis, no pneumothorax.   Electronically Signed   By: David  Martinique   On: 08/19/2013 08:24   Ir Lenise Arena W Catheter Placement  09/01/2013   CLINICAL DATA:  History breast cancer,  now with recurrent symptomatic left-sided pleural effusion.  EXAM: INSERTION OF TUNNELED LEFT SIDED PLEURAL DRAINAGE CATHETER  COMPARISON:  Ultrasound-guided paracentesis - 08/24/2013; 08/16/2013; PET-CT - 08/16/2013  MEDICATIONS: Ancef 2 gram IV; Antibiotic was administered in an appropriate time interval for the procedure.  ANESTHESIA/SEDATION: Versed 3 mg IV; Fentanyl 200 mcg IV  Total Moderate Sedation Time  25 minutes.  FLUOROSCOPY TIME:  FLUOROSCOPY TIME 36 seconds  COMPLICATIONS: None immediate  PROCEDURE: The procedure, risks, benefits, and alternatives were explained to the patient, who wish to proceed with the placement of this permanent pleural catheter. The patient understands and consents to the procedure.  The left lateral chest and upper abdomen were prepped with Chlorhexidine in a sterile fashion, and a sterile drape was applied covering the operative field. A sterile gown and sterile gloves were used for the procedure. Initial ultrasound scanning and fluoroscopic imaging demonstrates a recurrent moderate to large pleural effusion.  Under direct ultrasound guidance, the left inferior lateral pleural space  was accessed with a Yueh sheath needle after the overlying soft tissues were anesthetized with 1% lidocaine with epinephrine. An Amplatz super stiff wire was then advanced under fluoroscopy into the pleural space.  A 15.5 French tunneled Pleur-X catheter was tunneled from an incision within the right upper abdominal quadrant to the access site. The pleural access site was serially dilated under fluoroscopy, ultimately allowing placement of a peel-away sheath. The catheter was advanced through the peel-away sheath. The sheath was then removed. Final catheter positioning was confirmed with a fluoroscopic radiographic image.  The access incision was closed with subcutaneous subcuticular 4-0 Vicryl, Dermabond and Steri-Strips. A Prolene retention suture was applied at the catheter exit site. Large volume thoracentesis was performed through the new catheter utilizing provided bulb vacuum assisted drainage bag. The patient tolerated the above procedure well without immediate postprocedural complication.  FINDINGS: Preprocedural ultrasound scanning demonstrates a recurrent moderate to large sized left sided pleural effusion.  After ultrasound and fluoroscopic guided placement, the catheter is directed towards the left lung apex.  Following catheter placement, approximately 1 mL of serous pleural fluid was removed.  IMPRESSION: Successful placement of permanent, tunneled left pleural drainage catheter via lateral approach. Approximately 1 liter of serous pleural fluid was removed after catheter placement.   Electronically Signed   By: Sandi Mariscal M.D.   On: 09/01/2013 15:58   Nm Pet Image Restag (ps) Skull Base To Thigh  08/16/2013   ADDENDUM REPORT: 08/16/2013 11:14  ADDENDUM: The original report was by Dr. Van Clines. The following addendum is by Dr. Van Clines:  These results were called by telephone at the time of interpretation on 08/16/2013 at 11:14 AM to Dr. Lurline Del , who verbally acknowledged  these results.   Electronically Signed   By: Sherryl Barters M.D.   On: 08/16/2013 11:14   08/16/2013   CLINICAL DATA:  Subsequent treatment strategy for breast cancer. New symptoms of pain and cough.  EXAM: NUCLEAR MEDICINE PET SKULL BASE TO THIGH  TECHNIQUE: 8.7 mCi F-18 FDG was injected intravenously. Full-ring PET imaging was performed from the skull base to thigh after the radiotracer. CT data was obtained and used for attenuation correction and anatomic localization.  FASTING BLOOD GLUCOSE:  Value: 90 mg/dl  COMPARISON:  01/10/2013  FINDINGS: NECK  Symmetric laryngeal activity and symmetric salivary gland activity, likely physiologic.  CHEST  Extensive left pleural metastatic disease. Index pleural rind post for medially along the left hemidiaphragmatic border with maximum standard uptake value 13.6.  Confluent prevascular and adjacent pleural based mass short axis diameter 2.7 cm, maximum standard uptake value 13.9. Large left pleural effusion encompasses at least 75% of the left hemithorax. Cardiac and mediastinal structures are shifted to the right.  ABDOMEN/PELVIS  Adenopathy just above the celiac trunk measures up to 1.3 cm in short axis with conglomerate maximum standard uptake value 8.8. Nodal metastatic disease posterior to the left adrenal gland in the left periaortic region with individual node short axis diameter 1.1 cm and conglomerate maximum standard uptake value of 14.1.  Ancillary findings of hepatic steatosis, non hypermetabolic but a hypodense lesion in segment 4B be of the liver, and levoconvex lumbar scoliosis.  SKELETON  Left medial clavicular metastatic lesion, maximum standard uptake value 7.5.  IMPRESSION: 1. Extensive multifocal left pleural metastatic disease with hypermetabolic prevascular and upper abdominal retroperitoneal adenopathy. 2. Metastatic lesion in the left medial clavicle. 3. Large left pleural effusion with shift of cardiac and mediastinal structures to the right.  There are described cases of tension hydrothorax in patients with malignant pleural effusions, and given the mediastinal shift without explaining atelectasis in the right lung, this is a possibility in this patient. 4. Hepatic steatosis.  Electronically Signed: By: Sherryl Barters M.D. On: 08/16/2013 10:50   US Thoracentesis Asp Pleural Space W/img Guide  08/24/2013   CLINICAL DATA:  Malignant pleural effusion, shortness of breath, breast cancer  EXAM: ULTRASOUND GUIDED LEFT THORACENTESIS  COMPARISON:  08/16/2013  PROCEDURE: An ultrasound guided thoracentesis was thoroughly discussed with the patient and questions answered. The benefits, risks, alternatives and complications were also discussed. The patient understands and wishes to proceed with the procedure. Written consent was obtained.  Ultrasound was performed to localize and mark an adequate pocket of fluid in the left chest. The area was then prepped and draped in the normal sterile fashion. 1% Lidocaine was used for local anesthesia. Under ultrasound guidance a 19 gauge Yueh catheter was introduced. Thoracentesis was performed. The catheter was removed and a dressing applied.  Complications:  No immediate  FINDINGS: A total of approximately 1 L of serosanguineous pleural fluid was removed. A fluid sample was notsent for laboratory analysis.  IMPRESSION: Successful ultrasound guided left thoracentesis yielding 1 L of pleural fluid.   Electronically Signed   By: Daryll Brod M.D.   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 She presents for therapeutic and diagnostic thoracentesis.  EXAM: US THORACENTESIS ASP PLEURAL SPACE W/IMG GUIDE  Date: 08/16/2013  PROCEDURE: 1. Ultrasound-guided left thoracentesis Interventional Radiologist:  Criselda Peaches, MD   ANESTHESIA/SEDATION: None required  TECHNIQUE: Informed consent was obtained from the patient following explanation of the procedure, risks, benefits and alternatives. The patient understands, agrees and consents for the procedure. All questions were addressed. A time out was performed.  The left posterior thorax was interrogated with ultrasound. There is a large pleural effusion. Inferiorly, there is a thickened pleural rind consistent with the known pleural implants. A suitable window in the more superior mid scapular line was selected.  The region was prepped and draped in the standard fashion using Betadine. Local anesthesia was attained by infiltration with 1% lidocaine. A small dermatotomy was made. Under real-time sonographic guidance, a 5 [place] Yueh centesis catheter was advanced over rhythm into these pleural fluid. Approximately 1.5 L of clear yellow pleural fluid  was then hand aspirated. The fluid was sent for cytology.  A sterile Band-Aid was applied. The patient tolerated the procedure well; there was no immediate complication.  IMPRESSION: Ultrasound-guided left thoracentesis with aspiration of approximately 1.5 L of clear yellow pleural fluid.  Post thoracentesis text x-ray demonstrates a small basilar pneumothorax ex vacuo likely secondary to incomplete re-expansion of the lung.  Signed,  Criselda Peaches, MD  [facility]  [facility]   Electronically Signed   By: Jacqulynn Cadet M.D.   On: 08/16/2013 17:08   ASSESSMENT: 53 y.o. [place] woman with stage IV breast cancer, history as follows  (1)  S/p Right lumpectomy and sentinel lymph node sampling 03/15/2004 for a pT1c pN0. Stage IA invasive ductal carcinoma, grade 2, estrogen receptor 95% positive, progesterone receptor 65% positive, HER-2 not amplified; additional surgery 04/25/2004 for seroma or clearance showed no residual tumor  (2) adjuvant chemotherapy with cyclophosphamide and  doxorubicin every 21 days x4 completed 07/19/2004  (3) adjuvant radiation given under Dr. Donella Stade in [place] completed July 2006  (4) the patient opted against adjuvant antiestrogen therapy  (5) genetics testing showed no BRCA mutations  (6) biopsy of a palpable right axillary mass 10/24/2009 showed invasive ductal carcinoma, grade 3, estrogen receptor 100% positive, progesterone receptor 2% positive (alert score 5) HER-2 negative; no evidence of systemic disease on PET scanning  (7) completed 3 of 4 planned cycles of docetaxel and cyclophosphamide September 2011, fourth cycle omitted because of marked elevations in liver function tests  (8) an right axillary lymph node dissection 03/06/2010 showed 3/8 lymph nodes removed to be involved by tumor, with extracapsular extension.  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, TTS-1 negative), estrogen receptor 80% positive, progesterone receptor 2% positive, HER-2 not amplified  (12) letrozole started March 2015-- tolerated with significant side effects, discontinued at the end of May 20 15th  (13) PET scan 08/16/2013 shows extensive left pleural metastatic disease and a large left pleural effusion that shifts cardiac and mediastinal structures to the right; adenopathy (celiac trunk, periadrenal, periaortic); and a left medial clavicular lesion; Status post left thoracentesis 08/16/2013 positive for adenocarcinoma, estrogen receptor positive, progesterone receptor negative.  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 As detailed above, she had a pretty horrible week after her first dose of her vigilant. Possibly not all the symptoms were due to her eribulin, but in any case the treatment took over her life. We discussed multiple changes that could help, particularly adding Skelaxin and using dexamethasone for nausea. However, the fact that her liver function tests jumped up, which happened previously with her taxine  treatments, tells me she is really not going to be able to tolerate the eribulin, and therefore we're discontinuing those treatments.  We are going to go on letrozole plus Ibrance, starting Monday, when she returns to see me. I would like her to "normalize" over the next few days, which she will be going off the supportive medicines that we prescribed for her previously. She will also have a Pleuryx fluid drainage on June 12 and June 15 before she sees me. I will give [place] further information on what is happening in terms of reaccumulation rates.  She would like to start the Ibrance at 100 mg instead of 125, but in any case we're going to be checking her liver functions and CBCs on a weekly basis so I think we could start an I-125 and see how she does on that and then drop the dose if necessary. That is the usual procedure.  Shye has a good understanding of the overall plan. She agrees with it. She knows the goal of treatment in her case is control. She will call with any problems that may develop  before her next visit here.  Chauncey Cruel, MD   09/08/2013 8:58 AM

## 2013-09-08 NOTE — Progress Notes (Signed)
Chemo cancelled today per Dr Jana Hakim

## 2013-09-08 NOTE — Telephone Encounter (Signed)
This RN called pt to inform her of MD review of U/A and prescription for Cipro.  Per call - [name] [name] states she found a small wet spot on her top near the area of the tube dressing - " but the dressing is dry " and inquired what to do about it.  " I had spoke to Dr Pascal Lux who states it is best to proceed to radiology if there is a problem with the catheter vs going to the ER "  Per presently area of concern is not enlarged or recurred.  This RN informed pt to apply dry 4x4's on top of current dressing at catheter site and monitor for any leakage. Unless severe she can call in am or wait for home health nurse who is scheduled for visit in am.  Note during phone conversation pt developed an episode of " spasms " in chest with verbalized discomfort.  Post episode- [name] [name] was very apologetic regarding her reaction to pain- this RN discussed pain occurrence and current dose of skelaxin. Note [name] [name] took 400mg  at 2 pm.  This RN recommended for pt to take additional 400mg  ( 1/2 tab ) and then in 4 hours to take the 800mg  dose and maintain until spasms have resolved- pt can then back down to the 400mg  dose.  [name] [name] verbalized understanding.

## 2013-09-08 NOTE — Addendum Note (Signed)
Addended by: Chauncey Cruel on: 09/08/2013 01:09 PM   Modules accepted: Orders

## 2013-09-08 NOTE — Telephone Encounter (Signed)
Patient called reporting she was urinating frequently and this has now increased with burning and dribbling as the morning has progressed.  Informed her I will notify providers and call her with any instructions or orders.  Has transportation at this time.  Returning to [facility] on her own accord.  Will notify providers.

## 2013-09-09 ENCOUNTER — Ambulatory Visit: Payer: 59 | Admitting: Physical Therapy

## 2013-09-09 ENCOUNTER — Other Ambulatory Visit: Payer: Self-pay | Admitting: *Deleted

## 2013-09-09 LAB — URINE CULTURE

## 2013-09-10 LAB — CULTURE, BLOOD (ROUTINE X 2): CULTURE: NO GROWTH

## 2013-09-11 ENCOUNTER — Telehealth (HOSPITAL_BASED_OUTPATIENT_CLINIC_OR_DEPARTMENT_OTHER): Payer: Self-pay | Admitting: Emergency Medicine

## 2013-09-11 NOTE — Telephone Encounter (Signed)
Per Kaitlyn Szekalski PA-C, no treatment needed 

## 2013-09-12 ENCOUNTER — Ambulatory Visit (HOSPITAL_BASED_OUTPATIENT_CLINIC_OR_DEPARTMENT_OTHER): Payer: 59 | Admitting: Oncology

## 2013-09-12 ENCOUNTER — Telehealth: Payer: Self-pay | Admitting: Oncology

## 2013-09-12 ENCOUNTER — Other Ambulatory Visit (HOSPITAL_BASED_OUTPATIENT_CLINIC_OR_DEPARTMENT_OTHER): Payer: 59

## 2013-09-12 ENCOUNTER — Ambulatory Visit: Payer: 59 | Admitting: Physical Therapy

## 2013-09-12 VITALS — BP 138/96 | HR 126 | Temp 98.3°F | Resp 18 | Ht 63.0 in

## 2013-09-12 DIAGNOSIS — Z170 Estrogen receptor positive status [ER+]: Secondary | ICD-10-CM

## 2013-09-12 DIAGNOSIS — K7689 Other specified diseases of liver: Secondary | ICD-10-CM

## 2013-09-12 DIAGNOSIS — C50211 Malignant neoplasm of upper-inner quadrant of right female breast: Secondary | ICD-10-CM

## 2013-09-12 DIAGNOSIS — C50419 Malignant neoplasm of upper-outer quadrant of unspecified female breast: Secondary | ICD-10-CM

## 2013-09-12 DIAGNOSIS — C782 Secondary malignant neoplasm of pleura: Secondary | ICD-10-CM

## 2013-09-12 DIAGNOSIS — R945 Abnormal results of liver function studies: Secondary | ICD-10-CM | POA: Insufficient documentation

## 2013-09-12 DIAGNOSIS — M949 Disorder of cartilage, unspecified: Secondary | ICD-10-CM

## 2013-09-12 DIAGNOSIS — C773 Secondary and unspecified malignant neoplasm of axilla and upper limb lymph nodes: Secondary | ICD-10-CM

## 2013-09-12 DIAGNOSIS — J90 Pleural effusion, not elsewhere classified: Secondary | ICD-10-CM

## 2013-09-12 DIAGNOSIS — I82C19 Acute embolism and thrombosis of unspecified internal jugular vein: Secondary | ICD-10-CM

## 2013-09-12 DIAGNOSIS — I890 Lymphedema, not elsewhere classified: Secondary | ICD-10-CM

## 2013-09-12 DIAGNOSIS — R309 Painful micturition, unspecified: Secondary | ICD-10-CM

## 2013-09-12 DIAGNOSIS — R7989 Other specified abnormal findings of blood chemistry: Secondary | ICD-10-CM

## 2013-09-12 DIAGNOSIS — D709 Neutropenia, unspecified: Secondary | ICD-10-CM

## 2013-09-12 DIAGNOSIS — C50919 Malignant neoplasm of unspecified site of unspecified female breast: Secondary | ICD-10-CM

## 2013-09-12 DIAGNOSIS — R350 Frequency of micturition: Secondary | ICD-10-CM

## 2013-09-12 DIAGNOSIS — M899 Disorder of bone, unspecified: Secondary | ICD-10-CM

## 2013-09-12 LAB — COMPREHENSIVE METABOLIC PANEL (CC13)
ALBUMIN: 3.3 g/dL — AB (ref 3.5–5.0)
ALT: 446 U/L (ref 0–55)
AST: 225 U/L (ref 5–34)
Alkaline Phosphatase: 116 U/L (ref 40–150)
Anion Gap: 11 mEq/L (ref 3–11)
BUN: 8.3 mg/dL (ref 7.0–26.0)
CHLORIDE: 104 meq/L (ref 98–109)
CO2: 25 mEq/L (ref 22–29)
CREATININE: 0.8 mg/dL (ref 0.6–1.1)
Calcium: 9.2 mg/dL (ref 8.4–10.4)
GLUCOSE: 147 mg/dL — AB (ref 70–140)
POTASSIUM: 3.2 meq/L — AB (ref 3.5–5.1)
Sodium: 140 mEq/L (ref 136–145)
Total Bilirubin: 0.3 mg/dL (ref 0.20–1.20)
Total Protein: 6.5 g/dL (ref 6.4–8.3)

## 2013-09-12 LAB — CBC WITH DIFFERENTIAL/PLATELET
BASO%: 2.9 % — ABNORMAL HIGH (ref 0.0–2.0)
BASOS ABS: 0.1 10*3/uL (ref 0.0–0.1)
EOS%: 0 % (ref 0.0–7.0)
Eosinophils Absolute: 0 10*3/uL (ref 0.0–0.5)
HEMATOCRIT: 38.6 % (ref 34.8–46.6)
HGB: 13.3 g/dL (ref 11.6–15.9)
LYMPH#: 1.1 10*3/uL (ref 0.9–3.3)
LYMPH%: 61.8 % — ABNORMAL HIGH (ref 14.0–49.7)
MCH: 31.3 pg (ref 25.1–34.0)
MCHC: 34.5 g/dL (ref 31.5–36.0)
MCV: 90.8 fL (ref 79.5–101.0)
MONO#: 0.3 10*3/uL (ref 0.1–0.9)
MONO%: 18.8 % — ABNORMAL HIGH (ref 0.0–14.0)
NEUT#: 0.3 10*3/uL — CL (ref 1.5–6.5)
NEUT%: 16.5 % — AB (ref 38.4–76.8)
Platelets: 446 10*3/uL — ABNORMAL HIGH (ref 145–400)
RBC: 4.25 10*6/uL (ref 3.70–5.45)
RDW: 12.8 % (ref 11.2–14.5)
WBC: 1.7 10*3/uL — ABNORMAL LOW (ref 3.9–10.3)
nRBC: 0 % (ref 0–0)

## 2013-09-12 LAB — URINALYSIS, MICROSCOPIC - CHCC
Bilirubin (Urine): NEGATIVE
Blood: NEGATIVE
GLUCOSE UR CHCC: NEGATIVE mg/dL
Ketones: NEGATIVE mg/dL
Leukocyte Esterase: NEGATIVE
NITRITE: NEGATIVE
Protein: NEGATIVE mg/dL
RBC / HPF: NEGATIVE (ref 0–2)
Specific Gravity, Urine: 1.005 (ref 1.003–1.035)
UROBILINOGEN UR: 0.2 mg/dL (ref 0.2–1)
WBC UA: NEGATIVE (ref 0–2)
pH: 6 (ref 4.6–8.0)

## 2013-09-12 NOTE — Progress Notes (Signed)
[place]  Telephone:(336) [phone] Fax:(336) [phone]     ID: [name] [name] OB: [birth year]  MR#: [id number]  YKD#:[id number]  PCP: Pcp Not In System GYN:  [name] [name] SU:  OTHER MD: [name] [name], [name] [name], [name] [name]  CHIEF COMPLAINT: Stage IV breast cancer CURRENT TREATMENT: chemotherapy  BREAST CANCER HISTORY: From doctor [name] [name]'s intake note 03/20/2004:  "The patient is a very pleasant 53 year old female, without significant past medical history.  Her family history is significant for a sister who at age 48 was diagnosed with invasive ductal carcinoma.  She is a breast cancer survivor at age 22 now.  The patient states that she has never really had a screening mammogram until October 2005, when she felt that it was time for her to start having mammograms done on a yearly basis.  Therefore, on 01/26/04, she underwent a screening mammogram and an abnormality was detected in the upper outer right breast.  She, therefore, underwent spot compression views of both the right and the left breast.  The left breast revealed a well-defined mass in the upper outer left quadrant, present at the 2 o'clock position, measuring 1.8 cm, 6 cm from the nipple.  This, by ultrasound, was felt to be a simple cyst measuring 1.8 cm.  On the right breast, a spiculated mass was noted in the upper outer right quadrant.  The ultrasound revealed a shadowing irregular solid mass at the 10:30 position, 9 cm from the nipple, measuring 1.2 cm in greatest dimension, correlating with the spiculated mass seen on the mammogram.  The right axilla was negative ultrasonically.  Because of this, the patient underwent a needle biopsy of the right breast and the biopsy was positive invasive mammary carcinoma that showed features consistent with a high-grade invasive ductal carcinoma associated with desmoplastic stroma.  No in situ component was seen and no definite lymphovascular invasion was identified.   On the core biopsy, the tumor measured about 0.8 cm.  Because of this, she was seen by Dr. [name] and the patient was taken to the [facility] on March 15, 2004.  She underwent a right breast lumpectomy with sentinel node biopsy.  The final pathology revealed an invasive ductal carcinoma, measuring 1.7 cm, grade 2 of 3.  Margins were free of tumor.  Atypical lobular hyperplasia was noted.  One sentinel node was removed which was negative for metastatic disease.  The tumor was staged at T1c, N0 MX.  It was estrogen receptor positive, progesterone receptor positive.  HER-2/neu was 2+.  FISH was negative.  All margins were free of tumor.  She is now seen in Medical Oncology for further evaluation and management of this newly diagnosed T1c, node negative, stage I, invasive ductal carcinoma of the right breast."  Her subsequent history is as detailed below  INTERVAL HISTORY: [name] [name] returns today for followup of her breast cancer accompanied by her husband [name] [name]. Today is day 11 cycle 1 of her eribulin. We stopped the drug because of side effects and elevations in her liver function tests, and in fact her liver function tests are even more elevated today. She is also significantly neutropenic. This is unusual for a single dose of a drug like to review them, which is generally very well tolerated  REVIEW OF SYSTEMS: [name] [name] is feeling better than she did last visit. She still feels the taste perversion and some nausea so she felt pretty sure her liver function tests have not yet gone back to normal. She developed  a pain in her left axilla, very specific spot, which had her screening because of the discomfort. She took two Motrin and a Skelaxin and it helped. She had her husband [name] [name] feel and look at the area and he couldn't feel anything. She showed it to me today and I think what she was feeling is likely a meds in a rib in that area. Luckily the pain eventually subsided. She has had no unusual headaches, visual  changes, significant nausea or any vomiting. A detailed review of systems today was otherwise noncontributory  PAST MEDICAL HISTORY: Past Medical History  Diagnosis Date  . Seizures 2010    Isolated incident.  . Breast cancer dx'd 2005/2011  . PONV (postoperative nausea and vomiting)   . Peripheral vascular disease 02/2010    blood clot related to porta cath    PAST SURGICAL HISTORY: Past Surgical History  Procedure Laterality Date  . Breast lumpectomy  2005  . Axillary lymph node dissection  Dec. 2011  . Portacath placement  12/11  . Removal portacath    . Mediastinotomy chamberlain mcneil Left 06/02/2013    Procedure: MEDIASTINOTOMY CHAMBERLAIN MCNEIL;  Surgeon: [name] Nakayama, MD;  Location: [facility];  Service: Thoracic;  Laterality: Left;  LEFT ANTERIOR MEDIASTINOTOMY     FAMILY HISTORY No family history on file. The patient's father is living, 34 years old as of may 2015. He lives in [place]. The patient's mother died from complications of COPD at the age of 98. These has 2 brothers, one sister. Her sister developed breast cancer at the age of 35. She is doing well. The patient herself underwent genetic testing at [facility] in 2011 and was found to be BRCA negative  GYNECOLOGIC HISTORY:  Menarche age 14, she is GX P0. She stopped having periods with her initial chemotherapy in 2006.  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 Her husband [name] [name] a Medical illustrator in [place]. He has a child from a prior marriage. At home they have 2 rescue talks, [place] and [place]. The patient is religious but not a church attender    ADVANCED DIRECTIVES:    HEALTH MAINTENANCE: History  Substance Use Topics  . Smoking status: Never Smoker   . Smokeless tobacco: Never Used  . Alcohol Use: No     Colonoscopy:  PAP:  Bone density:  Lipid panel:  Allergies  Allergen Reactions  . Decadron [Dexamethasone]  Other (See Comments)    Patient does not tolerate steroids.   . Hydromorphone Hcl Nausea And Vomiting  . Morphine And Related   . Tegaderm Ag Mesh [Silver]     Current Outpatient Prescriptions  Medication Sig Dispense Refill  . ALPRAZolam (XANAX) 0.5 MG tablet Take 1 tablet (0.5 mg total) by mouth 2 (two) times daily as needed for anxiety.  30 tablet  0  . b complex vitamins capsule Take 1 capsule by mouth daily.      . Calcium-Magnesium-Vitamin D (CITRACAL CALCIUM+D PO) Take by mouth. 42m vitamin C, 501mvitamin D3 2 PO DAILY      . ciprofloxacin (CIPRO) 500 MG tablet Take 1 tablet (500 mg total) by mouth 2 (two) times daily.  14 tablet  0  . folic acid (FOLVITE) 1 MG tablet Take 1 mg by mouth daily.      . Marland Kitchenetrozole (FEMARA) 2.5 MG tablet Take 2.5 mg by mouth daily.      . Magnesium  100 MG CAPS Take 250 each by mouth daily. Taking 250 mg per day      . Melatonin 1 MG TABS Take 3 mg by mouth at bedtime as needed (sleep).       . metaxalone (SKELAXIN) 800 MG tablet Take 0.5 tablets (400 mg total) by mouth 3 (three) times daily as needed for muscle spasms.  30 tablet  6  . naproxen (NAPROSYN) 500 MG tablet Take 500 mg by mouth 3 (three) times daily as needed for moderate pain.      . naproxen sodium (ANAPROX) 220 MG tablet Take 220 mg by mouth 2 (two) times daily as needed (pain).      Marland Kitchen omeprazole (PRILOSEC) 40 MG capsule Take 1 capsule (40 mg total) by mouth daily.  30 capsule  12  . oxyCODONE (OXY IR/ROXICODONE) 5 MG immediate release tablet Take 1-2 tablets (5-10 mg total) by mouth every 4 (four) hours as needed for moderate pain.  30 tablet  0  . palbociclib (IBRANCE) 125 MG capsule Take 1 capsule (125 mg total) by mouth daily with breakfast. Take whole with food.  21 capsule  12  . PRESCRIPTION MEDICATION eriBULin mesylate (HALAVEN) 2.6 mg in sodium chloride 0.9 % 100 mL chemo infusion 1.4 mg/m2  1.86 m2 (Treatment Plan Actual)  Once 09/02/2013      . VITAMIN D, CHOLECALCIFEROL, PO  Take 2,000 Units by mouth daily.       . [DISCONTINUED] metoCLOPramide (REGLAN) 10 MG tablet Take 1 tablet (10 mg total) by mouth 4 (four) times daily -  before meals and at bedtime.  60 tablet  3   No current facility-administered medications for this visit.    OBJECTIVE: Middle-aged white woman in no acute distress Filed Vitals:   09/12/13 1613  BP: 138/96  Pulse: 126  Temp: 98.3 F (36.8 C)  Resp: 18     Body mass index is 0.00 kg/(m^2).      ECOG FS:1 - Symptomatic but completely ambulatory  Ocular: Sclerae unicteric, EOMs intact Ear-nose-throat: Oropharynx clear and moist Lymphatic: No cervical or supraclavicular adenopathy Lungs no rales or rhonchi, dullness to percussion in left base Heart regular rate and rhythm Abd soft, nontender, positive bowel sounds MSK no focal spinal tenderness; Pleurx catheter left flank. I do not feel any mass and there is no erythema or swelling in the left axillary spot where I can however pinpoint an area of tenderness. It appears to be a rib. Possibly there is a small tumor deposit there. Neuro: non-focal, well-oriented, appropriate affect Breasts: Deferred  LAB RESULTS:  CMP     Component Value Date/Time   NA 140 09/12/2013 1555   NA 134* 09/03/2013 2145   K 3.2* 09/12/2013 1555   K 3.7 09/03/2013 2145   CL 97 09/03/2013 2145   CL 105 05/06/2012 1333   CO2 25 09/12/2013 1555   CO2 23 09/03/2013 2145   GLUCOSE 147* 09/12/2013 1555   GLUCOSE 121* 09/03/2013 2145   GLUCOSE 124* 05/06/2012 1333   BUN 8.3 09/12/2013 1555   BUN 8 09/03/2013 2145   CREATININE 0.8 09/12/2013 1555   CREATININE 0.64 09/03/2013 2145   CALCIUM 9.2 09/12/2013 1555   CALCIUM 9.0 09/03/2013 2145   PROT 6.5 09/12/2013 1555   PROT 6.8 09/03/2013 2145   ALBUMIN 3.3* 09/12/2013 1555   ALBUMIN 3.3* 09/03/2013 2145   AST 225* 09/12/2013 1555   AST 55* 09/03/2013 2145   ALT 446* 09/12/2013 1555   ALT 85* 09/03/2013  2145   ALKPHOS 116 09/12/2013 1555   ALKPHOS 114 09/03/2013 2145   BILITOT 0.30  09/12/2013 1555   BILITOT 0.3 09/03/2013 2145   GFRNONAA >90 09/03/2013 2145   GFRAA >90 09/03/2013 2145    I No results found for this basename: SPEP,  UPEP,   kappa and lambda light chains    Lab Results  Component Value Date   WBC 1.7 Repeated and Verified* 09/12/2013   NEUTROABS 0.3 Repeated and Verified* 09/12/2013   HGB 13.3 09/12/2013   HCT 38.6 09/12/2013   MCV 90.8 09/12/2013   PLT 446* 09/12/2013      Chemistry      Component Value Date/Time   NA 140 09/12/2013 1555   NA 134* 09/03/2013 2145   K 3.2* 09/12/2013 1555   K 3.7 09/03/2013 2145   CL 97 09/03/2013 2145   CL 105 05/06/2012 1333   CO2 25 09/12/2013 1555   CO2 23 09/03/2013 2145   BUN 8.3 09/12/2013 1555   BUN 8 09/03/2013 2145   CREATININE 0.8 09/12/2013 1555   CREATININE 0.64 09/03/2013 2145      Component Value Date/Time   CALCIUM 9.2 09/12/2013 1555   CALCIUM 9.0 09/03/2013 2145   ALKPHOS 116 09/12/2013 1555   ALKPHOS 114 09/03/2013 2145   AST 225* 09/12/2013 1555   AST 55* 09/03/2013 2145   ALT 446* 09/12/2013 1555   ALT 85* 09/03/2013 2145   BILITOT 0.30 09/12/2013 1555   BILITOT 0.3 09/03/2013 2145       Lab Results  Component Value Date   LABCA2 25 11/02/2007    No components found with this basename: LABCA125    No results found for this basename: INR,  in the last 168 hours  Urinalysis    Component Value Date/Time   COLORURINE YELLOW 09/03/2013 2109   [place] 09/03/2013 2109   LABSPEC 1.005 09/12/2013 1542   LABSPEC 1.016 09/03/2013 2109   PHURINE 6.0 09/03/2013 2109   GLUCOSEU Negative 09/12/2013 [place] 09/03/2013 2109   HGBUR NEGATIVE 09/03/2013 2109   BILIRUBINUR NEGATIVE 09/03/2013 2109   KETONESUR NEGATIVE 09/03/2013 2109   PROTEINUR NEGATIVE 09/03/2013 2109   UROBILINOGEN 0.2 09/12/2013 1542   UROBILINOGEN 0.2 09/03/2013 2109   NITRITE NEGATIVE 09/03/2013 2109   LEUKOCYTESUR NEGATIVE 09/03/2013 2109    STUDIES: Dg Chest 1 View  09/01/2013   CLINICAL DATA:  Left-sided pleural drain placement.  EXAM:  CHEST - 1 VIEW  COMPARISON:  May 31, 2013.  FINDINGS: Interval placement of left-sided pleural drain is noted. No pneumothorax is noted. Mild left pleural effusion is noted. Left basilar opacity is noted concerning for atelectasis. Right lung is clear. Surgical clips noted in right axillary region.  IMPRESSION: Interval placement of left-sided pleural drain with mild left pleural effusion noted and probable left lower lobe atelectasis. No pneumothorax is noted.   Electronically Signed   By: Sabino Dick M.D.   On: 09/01/2013 16:45   Dg Chest 1 View  08/24/2013   CLINICAL DATA:  Status post left thoracentesis  EXAM: CHEST - 1 VIEW  COMPARISON:  08/23/2013  FINDINGS: There is been significant reduction in left-sided pleural effusion with only a small amount of fluid remaining following thoracentesis. No pneumothorax is noted. Mild atelectatic changes are noted in the left mid lung. The right lung remains clear. Postsurgical changes in the right axilla are again noted.  IMPRESSION: No evidence of post thoracentesis pneumothorax. Significant reduction in left pleural effusion is  noted.   Electronically Signed   By: [name] [name] M.D.   On: 08/24/2013 11:48   Dg Chest 1 View  08/16/2013   CLINICAL DATA:  Status post left thoracentesis.  EXAM: CHEST - 1 VIEW  COMPARISON:  PET-CT 08/16/2013. Thoracic spine radiographs 08/04/2013. Chest radiographs 05/31/2013.  FINDINGS: The cardiac silhouette is partially obscured without gross enlargement. There is a small left pleural effusion, greatly decreased in size compared to recent PET-CT. There is a small loculated pneumothorax at the left lung base. Opacity in the left mid to lower lung likely represents atelectasis. The right lung is clear. Surgical clips are present in the right axilla.  IMPRESSION: 1. Small left pleural effusion, greatly decreased in size following thoracentesis. 2. Loculated gas collection at the left lung base consistent with a small ex vacuo  pneumothorax. 3. Left basilar parenchymal opacity, likely reflecting atelectasis. These results were communicated to Dr. Jacqulynn [name] at the time of dictation.   Electronically Signed   By: [name] [name]   On: 08/16/2013 17:05   Dg Chest 2 View  09/03/2013   CLINICAL DATA:  Fever and tachycardia  EXAM: CHEST  2 VIEW  COMPARISON:  September 01, 2013  FINDINGS: There is increased left base consolidation with small left effusion. Right lung is clear. Heart is mildly enlarged with normal pulmonary vascularity. No adenopathy. There is a chest tube on the left without appreciable pneumothorax. There are surgical clips in the right axillary region. No bone lesions.  IMPRESSION: Chest tube again noted on the left. No pneumothorax. Increase in left base consolidation with small left effusion. Right lung clear. No change in cardiac silhouette.   Electronically Signed   By: Lowella Grip M.D.   On: 09/03/2013 21:27   Dg Chest 2 View  08/30/2013   CLINICAL DATA:  Breast cancer with metastasis. Cough. Pleural effusion.  EXAM: CHEST  2 VIEW  COMPARISON:  08/24/2013  FINDINGS: Lateral view degraded by patient arm position. Right axillary node dissection. S-shaped thoracolumbar spine curvature. Mild cardiomegaly. Moderate left pleural effusion is increased. No pneumothorax. Right lung clear. Left lower lobe airspace disease is increased.  IMPRESSION: Increased left-sided pleural fluid, moderate. Adjacent airspace disease in the left lower lobe is also increased.  Cardiomegaly without congestive failure.   Electronically Signed   By: Abigail Miyamoto M.D.   On: 08/30/2013 10:53   Dg Chest 2 View  08/23/2013   CLINICAL DATA:  Recent pneumothorax related to thoracentesis  EXAM: CHEST  2 VIEW  COMPARISON:  08/19/2013  FINDINGS: Enlarging moderate to large left pleural effusion noted with lingula and left lower lobe collapse/ consolidation compared to the prior study. No current pneumothorax. Stable right lung aeration. Previous  breast surgery and axillary lymph node dissection on the right. Trachea is midline.  IMPRESSION: Enlarging left pleural effusion with lingula and left lower lobe collapse/consolidation.  No current pneumothorax.   Electronically Signed   By: [name] [name] M.D.   On: 08/23/2013 14:19   Dg Chest 2 View  08/19/2013   CLINICAL DATA:  Recent thoracentesis, increasing pain in the left axillary region and under the left breast  EXAM: CHEST  2 VIEW  COMPARISON:  DG CHEST 1 VIEW dated 08/16/2013; DG THORACIC SPINE dated 08/04/2013  FINDINGS: There is persistent pleural effusion on the left not greatly changed since the earlier study. There is no pneumothorax or pneumomediastinum. Atelectatic change in the left mid lung has improved. The right lung is clear. The cardiopericardial silhouette is  normal in size. The observed bony thorax is normal.  IMPRESSION: Stable left pleural effusion; improved left-sided atelectasis, no pneumothorax.   Electronically Signed   By: [name]  [name]   On: 08/19/2013 08:24   Ir Lenise Arena W Catheter Placement  09/01/2013   CLINICAL DATA:  History breast cancer, now with recurrent symptomatic left-sided pleural effusion.  EXAM: INSERTION OF TUNNELED LEFT SIDED PLEURAL DRAINAGE CATHETER  COMPARISON:  Ultrasound-guided paracentesis - 08/24/2013; 08/16/2013; PET-CT - 08/16/2013  MEDICATIONS: Ancef 2 gram IV; Antibiotic was administered in an appropriate time interval for the procedure.  ANESTHESIA/SEDATION: Versed 3 mg IV; Fentanyl 200 mcg IV  Total Moderate Sedation Time  25 minutes.  FLUOROSCOPY TIME:  FLUOROSCOPY TIME 36 seconds  COMPLICATIONS: None immediate  PROCEDURE: The procedure, risks, benefits, and alternatives were explained to the patient, who wish to proceed with the placement of this permanent pleural catheter. The patient understands and consents to the procedure.  The left lateral chest and upper abdomen were prepped with Chlorhexidine in a sterile fashion, and a sterile drape was  applied covering the operative field. A sterile gown and sterile gloves were used for the procedure. Initial ultrasound scanning and fluoroscopic imaging demonstrates a recurrent moderate to large pleural effusion.  Under direct ultrasound guidance, the left inferior lateral pleural space was accessed with a Yueh sheath needle after the overlying soft tissues were anesthetized with 1% lidocaine with epinephrine. An Amplatz super stiff wire was then advanced under fluoroscopy into the pleural space.  A 15.5 French tunneled Pleur-X catheter was tunneled from an incision within the right upper abdominal quadrant to the access site. The pleural access site was serially dilated under fluoroscopy, ultimately allowing placement of a peel-away sheath. The catheter was advanced through the peel-away sheath. The sheath was then removed. Final catheter positioning was confirmed with a fluoroscopic radiographic image.  The access incision was closed with subcutaneous subcuticular 4-0 Vicryl, Dermabond and Steri-Strips. A Prolene retention suture was applied at the catheter exit site. Large volume thoracentesis was performed through the new catheter utilizing provided bulb vacuum assisted drainage bag. The patient tolerated the above procedure well without immediate postprocedural complication.  FINDINGS: Preprocedural ultrasound scanning demonstrates a recurrent moderate to large sized left sided pleural effusion.  After ultrasound and fluoroscopic guided placement, the catheter is directed towards the left lung apex.  Following catheter placement, approximately 1 mL of serous pleural fluid was removed.  IMPRESSION: Successful placement of permanent, tunneled left pleural drainage catheter via lateral approach. Approximately 1 liter of serous pleural fluid was removed after catheter placement.   Electronically Signed   By: [name] [name] M.D.   On: 09/01/2013 15:58   Nm Pet Image Restag (ps) Skull Base To Thigh  08/16/2013    ADDENDUM REPORT: 08/16/2013 11:14  ADDENDUM: The original report was by Dr. Van Clines. The following addendum is by Dr. Van Clines:  These results were called by telephone at the time of interpretation on 08/16/2013 at 11:14 AM to Dr. Lurline Del , who verbally acknowledged these results.   Electronically Signed   By: [name] [name] M.D.   On: 08/16/2013 11:14   08/16/2013   CLINICAL DATA:  Subsequent treatment strategy for breast cancer. New symptoms of pain and cough.  EXAM: NUCLEAR MEDICINE PET SKULL BASE TO THIGH  TECHNIQUE: 8.7 mCi F-18 FDG was injected intravenously. Full-ring PET imaging was performed from the skull base to thigh after the radiotracer. CT data was obtained and used for attenuation correction and  anatomic localization.  FASTING BLOOD GLUCOSE:  Value: 90 mg/dl  COMPARISON:  01/10/2013  FINDINGS: NECK  Symmetric laryngeal activity and symmetric salivary gland activity, likely physiologic.  CHEST  Extensive left pleural metastatic disease. Index pleural rind post for medially along the left hemidiaphragmatic border with maximum standard uptake value 13.6. Confluent prevascular and adjacent pleural based mass short axis diameter 2.7 cm, maximum standard uptake value 13.9. Large left pleural effusion encompasses at least 75% of the left hemithorax. Cardiac and mediastinal structures are shifted to the right.  ABDOMEN/PELVIS  Adenopathy just above the celiac trunk measures up to 1.3 cm in short axis with conglomerate maximum standard uptake value 8.8. Nodal metastatic disease posterior to the left adrenal gland in the left periaortic region with individual node short axis diameter 1.1 cm and conglomerate maximum standard uptake value of 14.1.  Ancillary findings of hepatic steatosis, non hypermetabolic but a hypodense lesion in segment 4B be of the liver, and levoconvex lumbar scoliosis.  SKELETON  Left medial clavicular metastatic lesion, maximum standard uptake value 7.5.   IMPRESSION: 1. Extensive multifocal left pleural metastatic disease with hypermetabolic prevascular and upper abdominal retroperitoneal adenopathy. 2. Metastatic lesion in the left medial clavicle. 3. Large left pleural effusion with shift of cardiac and mediastinal structures to the right. There are described cases of tension hydrothorax in patients with malignant pleural effusions, and given the mediastinal shift without explaining atelectasis in the right lung, this is a possibility in this patient. 4. Hepatic steatosis.  Electronically Signed: By: [name] [name] M.D. On: 08/16/2013 10:50   US Thoracentesis Asp Pleural Space W/img Guide  08/24/2013   CLINICAL DATA:  Malignant pleural effusion, shortness of breath, breast cancer  EXAM: ULTRASOUND GUIDED LEFT THORACENTESIS  COMPARISON:  08/16/2013  PROCEDURE: An ultrasound guided thoracentesis was thoroughly discussed with the patient and questions answered. The benefits, risks, alternatives and complications were also discussed. The patient understands and wishes to proceed with the procedure. Written consent was obtained.  Ultrasound was performed to localize and mark an adequate pocket of fluid in the left chest. The area was then prepped and draped in the normal sterile fashion. 1% Lidocaine was used for local anesthesia. Under ultrasound guidance a 19 gauge Yueh catheter was introduced. Thoracentesis was performed. The catheter was removed and a dressing applied.  Complications:  No immediate  FINDINGS: A total of approximately 1 L of serosanguineous pleural fluid was removed. A fluid sample was notsent for laboratory analysis.  IMPRESSION: Successful ultrasound guided left thoracentesis yielding 1 L of pleural fluid.   Electronically Signed   By: [name] [name] M.D.   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 She presents for therapeutic and diagnostic thoracentesis.  EXAM: US THORACENTESIS ASP PLEURAL SPACE W/IMG GUIDE  Date: 08/16/2013  PROCEDURE: 1. Ultrasound-guided left thoracentesis Interventional Radiologist:  [name] Peaches, MD  ANESTHESIA/SEDATION: None required  TECHNIQUE: Informed consent was obtained from the patient following explanation of the procedure, risks, benefits and alternatives. The patient understands, agrees and consents for the procedure. All questions were addressed. A time out was performed.  The left posterior thorax was interrogated with ultrasound. There is a large pleural effusion. Inferiorly, there is a thickened pleural rind consistent with the known pleural implants. A suitable window in the more superior mid scapular line was selected.  The region was prepped and draped in the standard fashion using Betadine. Local anesthesia was attained by infiltration with 1% lidocaine. A small dermatotomy was made. Under real-time sonographic guidance, a 5 [place] Yueh centesis catheter was advanced over rhythm into these pleural fluid. Approximately 1.5 L of clear yellow pleural fluid was then hand aspirated. The fluid was sent for cytology.  A sterile Band-Aid was applied. The patient tolerated the procedure well; there was no immediate complication.  IMPRESSION: Ultrasound-guided left thoracentesis with aspiration of approximately 1.5 L of clear yellow pleural fluid.  Post thoracentesis text x-ray demonstrates a small basilar pneumothorax ex vacuo likely secondary to incomplete re-expansion of the lung.  Signed,  [name] Peaches, MD  [facility]  [facility]   Electronically Signed   By: [name] [name] M.D.   On: 08/16/2013 17:08   ASSESSMENT: 53 y.o. [name] woman with stage IV breast cancer, history as follows  (1)  S/p Right lumpectomy and sentinel lymph node sampling 03/15/2004 for a pT1c  pN0. Stage IA invasive ductal carcinoma, grade 2, estrogen receptor 95% positive, progesterone receptor 65% positive, HER-2 not amplified; additional surgery 04/25/2004 for seroma or clearance showed no residual tumor  (2) adjuvant chemotherapy with cyclophosphamide and doxorubicin every 21 days x4 completed 07/19/2004  (3) adjuvant radiation given under Dr. Donella [name] in [place] completed July 2006  (4) the patient opted against adjuvant antiestrogen therapy  (5) genetics testing showed no BRCA mutations  (6) biopsy of a palpable right axillary mass 10/24/2009 showed invasive ductal carcinoma, grade 3, estrogen receptor 100% positive, progesterone receptor 2% positive (alert score 5) HER-2 negative; no evidence of systemic disease on PET scanning  (7) completed 3 of 4 planned cycles of docetaxel and cyclophosphamide September 2011, fourth cycle omitted because of marked elevations in liver function tests  (8) an right axillary lymph node dissection 03/06/2010 showed 3/8 lymph nodes removed to be involved by tumor, with extracapsular extension.  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, TTS-1 negative), estrogen receptor 80% positive, progesterone receptor 2% positive, HER-2 not amplified  (12) letrozole started March 2015-- tolerated with significant side effects, discontinued at the end of May 20 15th  (13) PET scan 08/16/2013 shows extensive left pleural metastatic disease and a large left pleural effusion that shifts cardiac and mediastinal structures to the right; adenopathy (celiac trunk,  periadrenal, periaortic); and a left medial clavicular lesion; Status post left thoracentesis 08/16/2013 positive for adenocarcinoma, estrogen receptor positive, progesterone receptor negative.  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 She had a single dose of eribulin, and she has had a significant increase in liver function  tests and a significant drop in her neutrophils. My suspicion is that she lacks a metabolizing enzyme particularly affecting drugs like eribulin in the taxanes. Either we will have to give her minute doses or better avoid those drugs.  I think a good plan for her would be fulvestrant and Ibrance. We have learned that she can get the [facility] for anywhere from $10-$40 a month, which is doable. She is aware of the possible toxicities, side effects and complications, particularly concerns regarding counts, and we are "or doing it" initially by checking counts weekly, later every 2 weeks. She is afraid of starting at the dose of 125 but I do think that is the way to go and if she can tolerate it we can continue and if not certainly we have two dose levels we can drop to.  However she cannot start anything right now because of her counts and LFTs. She is going to take Cipro another 3 days by which time I hope of her neutropenia will have largely resolved. She is then going to return to see me July 10, and at that point  we will probably set her up to start the fulvestrant July 14 and the Ibrance the following week.  We did discuss the possibility of pleurodesed this. She is very much against that. At this point we are continuing Pleurx drainage every Monday and Friday. We can add Wednesdays if that becomes necessary.  She continues to hope for a trial that does not involve chemotherapy her anti-estrogens, and we discussed stopping by [facility] and meeting with Dr [name] to discuss what is available there. She can set up for [facility] at the same time. Avari is very agreeable to this suggestion.  The patient has a good understanding of the overall plan. She agrees with it. She knows the goal of treatment in her case is control. She will call with any problems that may develop before her next visit here.     Chauncey Cruel, MD   09/12/2013 5:03 PM

## 2013-09-12 NOTE — Telephone Encounter (Signed)
per pof to sch pt appt-sch and pt stated will look on MY CHART to review updated sch. Pt req to send [facility] an email to cancel chemo appts-adv will send

## 2013-09-13 ENCOUNTER — Encounter: Payer: Self-pay | Admitting: Oncology

## 2013-09-13 ENCOUNTER — Other Ambulatory Visit (HOSPITAL_COMMUNITY): Payer: Self-pay

## 2013-09-13 LAB — URINE CULTURE

## 2013-09-13 NOTE — Progress Notes (Signed)
I HAD MET WITH [name] [name] A WEEK OR SO AGO AND WE AGREED TO USE A CO-PAY CARD FOR HER NEXT ORDER OF IBRANCE.  I CALLED TODAY AND  WE HAD A CONFERENCE CALL WITH [name] [name] AND [name] [name] FROM [facility] [phone].  THE CARD BIN [id number]; GROUP [id number]; ID# [id number]. CALLED [facility] AND SPOKE TO RACHEL.  SHE TOOK DOWN ALL THE INFORMATION.  FAXED [facility]  [phone].

## 2013-09-14 ENCOUNTER — Telehealth: Payer: Self-pay | Admitting: *Deleted

## 2013-09-14 ENCOUNTER — Other Ambulatory Visit: Payer: Self-pay | Admitting: *Deleted

## 2013-09-14 ENCOUNTER — Telehealth: Payer: Self-pay | Admitting: Oncology

## 2013-09-14 ENCOUNTER — Ambulatory Visit: Payer: 59 | Admitting: Physical Therapy

## 2013-09-14 DIAGNOSIS — C50211 Malignant neoplasm of upper-inner quadrant of right female breast: Secondary | ICD-10-CM

## 2013-09-14 NOTE — Telephone Encounter (Signed)
Patient calling to find out when to expect her WBC to be higher in the event she makes plans that involve the public.  Discussed with Dr Jana Hakim, we will have patient come in Friday for CBC,, since we have to check blood to determine that she is no longer neutropenic. Patient would like to come before PT on Friday, will send orders to scheduling. Also cancelled CT scan for 6/30, this will be reordered when appropriate. Cancelled infusions for 6/25 and 7/2. No treatment ordered at this time.

## 2013-09-16 ENCOUNTER — Other Ambulatory Visit (HOSPITAL_BASED_OUTPATIENT_CLINIC_OR_DEPARTMENT_OTHER): Payer: 59

## 2013-09-16 ENCOUNTER — Ambulatory Visit: Payer: 59 | Admitting: Physical Therapy

## 2013-09-16 DIAGNOSIS — C50211 Malignant neoplasm of upper-inner quadrant of right female breast: Secondary | ICD-10-CM

## 2013-09-16 DIAGNOSIS — C50419 Malignant neoplasm of upper-outer quadrant of unspecified female breast: Secondary | ICD-10-CM

## 2013-09-16 LAB — CBC WITH DIFFERENTIAL/PLATELET
BASO%: 2.8 % — ABNORMAL HIGH (ref 0.0–2.0)
Basophils Absolute: 0.1 10*3/uL (ref 0.0–0.1)
EOS%: 0 % (ref 0.0–7.0)
Eosinophils Absolute: 0 10*3/uL (ref 0.0–0.5)
HCT: 39 % (ref 34.8–46.6)
HGB: 13.5 g/dL (ref 11.6–15.9)
LYMPH#: 1.4 10*3/uL (ref 0.9–3.3)
LYMPH%: 44.9 % (ref 14.0–49.7)
MCH: 31.5 pg (ref 25.1–34.0)
MCHC: 34.6 g/dL (ref 31.5–36.0)
MCV: 91.1 fL (ref 79.5–101.0)
MONO#: 0.6 10*3/uL (ref 0.1–0.9)
MONO%: 18.7 % — ABNORMAL HIGH (ref 0.0–14.0)
NEUT#: 1.1 10*3/uL — ABNORMAL LOW (ref 1.5–6.5)
NEUT%: 33.6 % — ABNORMAL LOW (ref 38.4–76.8)
NRBC: 0 % (ref 0–0)
Platelets: 453 10*3/uL — ABNORMAL HIGH (ref 145–400)
RBC: 4.28 10*6/uL (ref 3.70–5.45)
RDW: 13.1 % (ref 11.2–14.5)
WBC: 3.2 10*3/uL — AB (ref 3.9–10.3)

## 2013-09-19 ENCOUNTER — Ambulatory Visit: Payer: 59 | Admitting: Physical Therapy

## 2013-09-20 ENCOUNTER — Other Ambulatory Visit (HOSPITAL_COMMUNITY): Payer: Self-pay

## 2013-09-21 ENCOUNTER — Ambulatory Visit: Payer: 59 | Admitting: Physical Therapy

## 2013-09-22 ENCOUNTER — Ambulatory Visit: Payer: Self-pay

## 2013-09-23 ENCOUNTER — Ambulatory Visit: Payer: 59 | Admitting: Physical Therapy

## 2013-09-26 ENCOUNTER — Ambulatory Visit: Payer: 59 | Admitting: Physical Therapy

## 2013-09-27 ENCOUNTER — Other Ambulatory Visit (HOSPITAL_COMMUNITY): Payer: Self-pay

## 2013-09-27 ENCOUNTER — Ambulatory Visit (HOSPITAL_COMMUNITY): Payer: 59

## 2013-09-28 ENCOUNTER — Encounter: Payer: Self-pay | Admitting: Physical Therapy

## 2013-09-28 ENCOUNTER — Ambulatory Visit: Payer: 59 | Attending: Oncology | Admitting: Physical Therapy

## 2013-09-28 DIAGNOSIS — IMO0001 Reserved for inherently not codable concepts without codable children: Secondary | ICD-10-CM | POA: Diagnosis not present

## 2013-09-28 DIAGNOSIS — I890 Lymphedema, not elsewhere classified: Secondary | ICD-10-CM | POA: Diagnosis not present

## 2013-09-29 ENCOUNTER — Ambulatory Visit: Payer: Self-pay

## 2013-10-03 ENCOUNTER — Telehealth: Payer: Self-pay

## 2013-10-03 ENCOUNTER — Ambulatory Visit: Payer: Self-pay | Admitting: Oncology

## 2013-10-03 ENCOUNTER — Ambulatory Visit: Payer: 59 | Admitting: Physical Therapy

## 2013-10-03 DIAGNOSIS — IMO0001 Reserved for inherently not codable concepts without codable children: Secondary | ICD-10-CM | POA: Diagnosis not present

## 2013-10-03 NOTE — Telephone Encounter (Signed)
Faxed order for PE film to [facility].  Sent to scan.

## 2013-10-05 ENCOUNTER — Ambulatory Visit: Payer: 59 | Admitting: Physical Therapy

## 2013-10-05 DIAGNOSIS — IMO0001 Reserved for inherently not codable concepts without codable children: Secondary | ICD-10-CM | POA: Diagnosis not present

## 2013-10-06 ENCOUNTER — Other Ambulatory Visit: Payer: Self-pay | Admitting: *Deleted

## 2013-10-06 ENCOUNTER — Ambulatory Visit (HOSPITAL_COMMUNITY)
Admission: RE | Admit: 2013-10-06 | Discharge: 2013-10-06 | Disposition: A | Discharge status: Home / Self Care | Payer: 59 | Source: Ambulatory Visit | Attending: Oncology | Admitting: Oncology

## 2013-10-06 ENCOUNTER — Other Ambulatory Visit (HOSPITAL_BASED_OUTPATIENT_CLINIC_OR_DEPARTMENT_OTHER): Payer: 59

## 2013-10-06 DIAGNOSIS — J9819 Other pulmonary collapse: Secondary | ICD-10-CM | POA: Insufficient documentation

## 2013-10-06 DIAGNOSIS — J90 Pleural effusion, not elsewhere classified: Secondary | ICD-10-CM

## 2013-10-06 DIAGNOSIS — C773 Secondary and unspecified malignant neoplasm of axilla and upper limb lymph nodes: Secondary | ICD-10-CM

## 2013-10-06 DIAGNOSIS — C50211 Malignant neoplasm of upper-inner quadrant of right female breast: Secondary | ICD-10-CM

## 2013-10-06 DIAGNOSIS — C50919 Malignant neoplasm of unspecified site of unspecified female breast: Secondary | ICD-10-CM

## 2013-10-06 DIAGNOSIS — R945 Abnormal results of liver function studies: Secondary | ICD-10-CM

## 2013-10-06 DIAGNOSIS — R7989 Other specified abnormal findings of blood chemistry: Secondary | ICD-10-CM

## 2013-10-06 DIAGNOSIS — C50419 Malignant neoplasm of upper-outer quadrant of unspecified female breast: Secondary | ICD-10-CM

## 2013-10-06 LAB — CBC WITH DIFFERENTIAL/PLATELET
BASO%: 0.6 % (ref 0.0–2.0)
Basophils Absolute: 0 10*3/uL (ref 0.0–0.1)
EOS%: 3.4 % (ref 0.0–7.0)
Eosinophils Absolute: 0.2 10*3/uL (ref 0.0–0.5)
HEMATOCRIT: 42.7 % (ref 34.8–46.6)
HEMOGLOBIN: 14.2 g/dL (ref 11.6–15.9)
LYMPH#: 1.3 10*3/uL (ref 0.9–3.3)
LYMPH%: 19.7 % (ref 14.0–49.7)
MCH: 31 pg (ref 25.1–34.0)
MCHC: 33.3 g/dL (ref 31.5–36.0)
MCV: 93.1 fL (ref 79.5–101.0)
MONO#: 0.5 10*3/uL (ref 0.1–0.9)
MONO%: 7.8 % (ref 0.0–14.0)
NEUT#: 4.6 10*3/uL (ref 1.5–6.5)
NEUT%: 68.5 % (ref 38.4–76.8)
Platelets: 388 10*3/uL (ref 145–400)
RBC: 4.59 10*6/uL (ref 3.70–5.45)
RDW: 13.3 % (ref 11.2–14.5)
WBC: 6.8 10*3/uL (ref 3.9–10.3)

## 2013-10-06 LAB — COMPREHENSIVE METABOLIC PANEL (CC13)
ALT: 109 U/L — AB (ref 0–55)
ANION GAP: 7 meq/L (ref 3–11)
AST: 55 U/L — ABNORMAL HIGH (ref 5–34)
Albumin: 3.7 g/dL (ref 3.5–5.0)
Alkaline Phosphatase: 86 U/L (ref 40–150)
BUN: 13.1 mg/dL (ref 7.0–26.0)
CHLORIDE: 105 meq/L (ref 98–109)
CO2: 29 meq/L (ref 22–29)
CREATININE: 0.8 mg/dL (ref 0.6–1.1)
Calcium: 9.9 mg/dL (ref 8.4–10.4)
Glucose: 67 mg/dl — ABNORMAL LOW (ref 70–140)
Potassium: 3.8 mEq/L (ref 3.5–5.1)
Sodium: 142 mEq/L (ref 136–145)
TOTAL PROTEIN: 7.1 g/dL (ref 6.4–8.3)
Total Bilirubin: 0.31 mg/dL (ref 0.20–1.20)

## 2013-10-07 ENCOUNTER — Ambulatory Visit: Payer: 59 | Admitting: Physical Therapy

## 2013-10-07 ENCOUNTER — Other Ambulatory Visit (HOSPITAL_COMMUNITY): Payer: Self-pay

## 2013-10-07 ENCOUNTER — Ambulatory Visit (HOSPITAL_BASED_OUTPATIENT_CLINIC_OR_DEPARTMENT_OTHER): Payer: 59 | Admitting: Oncology

## 2013-10-07 DIAGNOSIS — C50419 Malignant neoplasm of upper-outer quadrant of unspecified female breast: Secondary | ICD-10-CM

## 2013-10-07 DIAGNOSIS — K7689 Other specified diseases of liver: Secondary | ICD-10-CM

## 2013-10-07 DIAGNOSIS — M899 Disorder of bone, unspecified: Secondary | ICD-10-CM

## 2013-10-07 DIAGNOSIS — I890 Lymphedema, not elsewhere classified: Secondary | ICD-10-CM

## 2013-10-07 DIAGNOSIS — J90 Pleural effusion, not elsewhere classified: Secondary | ICD-10-CM

## 2013-10-07 DIAGNOSIS — IMO0001 Reserved for inherently not codable concepts without codable children: Secondary | ICD-10-CM | POA: Diagnosis not present

## 2013-10-07 DIAGNOSIS — C50912 Malignant neoplasm of unspecified site of left female breast: Secondary | ICD-10-CM

## 2013-10-07 DIAGNOSIS — R7989 Other specified abnormal findings of blood chemistry: Secondary | ICD-10-CM

## 2013-10-07 DIAGNOSIS — C773 Secondary and unspecified malignant neoplasm of axilla and upper limb lymph nodes: Secondary | ICD-10-CM

## 2013-10-07 DIAGNOSIS — C782 Secondary malignant neoplasm of pleura: Secondary | ICD-10-CM

## 2013-10-07 DIAGNOSIS — Z170 Estrogen receptor positive status [ER+]: Secondary | ICD-10-CM

## 2013-10-07 DIAGNOSIS — M949 Disorder of cartilage, unspecified: Secondary | ICD-10-CM

## 2013-10-07 DIAGNOSIS — I82C19 Acute embolism and thrombosis of unspecified internal jugular vein: Secondary | ICD-10-CM

## 2013-10-07 NOTE — Progress Notes (Signed)
[place]  Telephone:([phone]) [phone] Fax:(336) [phone]     ID: [name] [name] OB: 1960/08/15  MR#: [id number]  DJT#:[id number]  [name]: [name] [name] GYN:  [name] [name] SU:  OTHER MD: [name] [name], [name] [name], [name] [name]  CHIEF COMPLAINT: Stage IV breast cancer CURRENT TREATMENT: antiestrogens  BREAST CANCER HISTORY: From doctor [name] [name]'s intake note 03/20/2004:  "The patient is a very pleasant 53 year old female, without significant past medical history.  Her family history is significant for a sister who at age 68 was diagnosed with invasive ductal carcinoma.  She is a breast cancer survivor at age 13 now.  The patient states that she has never really had a screening mammogram until October 2005, when she felt that it was time for her to start having mammograms done on a yearly basis.  Therefore, on 01/26/04, she underwent a screening mammogram and an abnormality was detected in the upper outer right breast.  She, therefore, underwent spot compression views of both the right and the left breast.  The left breast revealed a well-defined mass in the upper outer left quadrant, present at the 2 o'clock position, measuring 1.8 cm, 6 cm from the nipple.  This, by ultrasound, was felt to be a simple cyst measuring 1.8 cm.  On the right breast, a spiculated mass was noted in the upper outer right quadrant.  The ultrasound revealed a shadowing irregular solid mass at the 10:30 position, 9 cm from the nipple, measuring 1.2 cm in greatest dimension, correlating with the spiculated mass seen on the mammogram.  The right axilla was negative ultrasonically.  Because of this, the patient underwent a needle biopsy of the right breast and the biopsy was positive invasive mammary carcinoma that showed features consistent with a high-grade invasive ductal carcinoma associated with desmoplastic stroma.  No in situ component was seen and no definite lymphovascular invasion was  identified.  On the core biopsy, the tumor measured about 0.8 cm.  Because of this, she was seen by [name] [name] and the patient was taken to the [facility] on March 15, 2004.  She underwent a right breast lumpectomy with sentinel node biopsy.  The final pathology revealed an invasive ductal carcinoma, measuring 1.7 cm, grade 2 of 3.  Margins were free of tumor.  Atypical lobular hyperplasia was noted.  One sentinel node was removed which was negative for metastatic disease.  The tumor was staged at T1c, N0 MX.  It was estrogen receptor positive, progesterone receptor positive.  HER-2/neu was 2+.  FISH was negative.  All margins were free of tumor.  She is now seen in Medical Oncology for further evaluation and management of this newly diagnosed T1c, node negative, stage I, invasive ductal carcinoma of the right breast."  Her subsequent history is as detailed below  INTERVAL HISTORY: [name] [name] returns today for followup of her stage IV breast cancer accompanied by her husband [name] [name]. Since her last visit here she visited with Dr. Albertina [name] at [facility]. There was no specific study the patient would fit into and the patient was not enrolled in [place] during that visit. As far as studies are concerned, Dr. Albertina [name] felt [name] [name] is unique hepatic sensitivity might disqualify her from most. She urged her to learn how to use [name] [name]. Dr. Albertina [name] also felt moving to fulvestrant was probably not necessary, as the letrozole have not been taken long enough to give it a chance to work.   REVIEW OF SYSTEMS: [name] [name] has largely recovered from  her single dose of eribulin, which she received 09/02/2013. She can tell all that her liver function tests have not completely resolved because her "stools are not quite right". She is getting drainage of her pleural fluid now at twice a week former nurse [name] [name]) whom she likes and trusts. She is draining about 200-300 cc twice a week.. She is also getting physical therapy for lymphedema through  [facility] several times a week.[name] [name] has had patchy hair loss secondary to the chemotherapy. She has just begun to get back on her treadmill. A detailed review of systems today was otherwise stable.  PAST MEDICAL HISTORY: Past Medical History  Diagnosis Date  . Seizures 2010    Isolated incident.  . Breast cancer dx'd 2005/2011  . PONV (postoperative nausea and vomiting)   . Peripheral vascular disease 02/2010    blood clot related to porta cath    PAST SURGICAL HISTORY: Past Surgical History  Procedure Laterality Date  . Breast lumpectomy  2005  . Axillary lymph node dissection  Dec. 2011  . Portacath placement  12/11  . Removal portacath    . Mediastinotomy chamberlain mcneil Left 06/02/2013    Procedure: MEDIASTINOTOMY CHAMBERLAIN MCNEIL;  Surgeon: [name] Nakayama, MD;  Location: [place];  Service: Thoracic;  Laterality: Left;  LEFT ANTERIOR MEDIASTINOTOMY     FAMILY HISTORY No family history on file. The patient's father is living, 69 years old as of may 2015. He lives in [place]. The patient's mother died from complications of COPD at the age of 60. These has 2 brothers, one sister. Her sister developed breast cancer at the age of 60. She is doing well. The patient herself underwent genetic testing at [facility] in 2011 and was found to be BRCA negative  GYNECOLOGIC HISTORY:  Menarche age 46, she is GX P0. She stopped having periods with her initial chemotherapy in 2006.  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 Her husband [name] [name] is a Medical illustrator in [place]. He has a child from a prior marriage. At home they have 2 rescue dogs, [name] [name] and [name] [name]. The patient is religious but not a church attender    ADVANCED DIRECTIVES:    HEALTH MAINTENANCE: History  Substance Use Topics  . Smoking status: Never Smoker   . Smokeless tobacco: Never Used  . Alcohol Use: No      Colonoscopy:  PAP:  Bone density:  Lipid panel:  Allergies  Allergen Reactions  . Decadron [Dexamethasone] Other (See Comments)    Patient does not tolerate steroids.   . Hydromorphone Hcl Nausea And Vomiting  . Morphine And Related   . Tegaderm Ag Mesh [Silver]     Current Outpatient Prescriptions  Medication Sig Dispense Refill  . ALPRAZolam (XANAX) 0.5 MG tablet Take 1 tablet (0.5 mg total) by mouth 2 (two) times daily as needed for anxiety.  30 tablet  0  . b complex vitamins capsule Take 1 capsule by mouth daily.      . Calcium-Magnesium-Vitamin D (CITRACAL CALCIUM+D PO) Take by mouth. 416m vitamin C, 504mvitamin D3 2 PO DAILY      . ciprofloxacin (CIPRO) 500 MG tablet Take 1 tablet (500 mg total) by mouth 2 (two) times daily.  14 tablet  0  . folic acid (FOLVITE) 1 MG tablet Take 1 mg by mouth daily.      . Marland Kitchenetrozole (FEMARA) 2.5 MG tablet Take 2.5  mg by mouth daily.      . Magnesium 100 MG CAPS Take 250 each by mouth daily. Taking 250 mg per day      . Melatonin 1 MG TABS Take 3 mg by mouth at bedtime as needed (sleep).       . metaxalone (SKELAXIN) 800 MG tablet Take 0.5 tablets (400 mg total) by mouth 3 (three) times daily as needed for muscle spasms.  30 tablet  6  . naproxen (NAPROSYN) 500 MG tablet Take 500 mg by mouth 3 (three) times daily as needed for moderate pain.      . naproxen sodium (ANAPROX) 220 MG tablet Take 220 mg by mouth 2 (two) times daily as needed (pain).      Marland Kitchen omeprazole (PRILOSEC) 40 MG capsule Take 1 capsule (40 mg total) by mouth daily.  30 capsule  12  . oxyCODONE (OXY IR/ROXICODONE) 5 MG immediate release tablet Take 1-2 tablets (5-10 mg total) by mouth every 4 (four) hours as needed for moderate pain.  30 tablet  0  . palbociclib (IBRANCE) 125 MG capsule Take 1 capsule (125 mg total) by mouth daily with breakfast. Take whole with food.  21 capsule  12  . PRESCRIPTION MEDICATION eriBULin mesylate (HALAVEN) 2.6 mg in sodium chloride 0.9 %  100 mL chemo infusion 1.4 mg/m2  1.86 m2 (Treatment Plan Actual)  Once 09/02/2013      . VITAMIN D, CHOLECALCIFEROL, PO Take 2,000 Units by mouth daily.       . [DISCONTINUED] metoCLOPramide (REGLAN) 10 MG tablet Take 1 tablet (10 mg total) by mouth 4 (four) times daily -  before meals and at bedtime.  60 tablet  3   No current facility-administered medications for this visit.    OBJECTIVE: Middle-aged white woman in no acute distress There were no vitals filed for this visit.   Body mass index is 0.00 kg/(m^2).      ECOG FS:1 - Symptomatic but completely ambulatory  Ocular: Sclerae unicteric, pupils round and reactive Ear-nose-throat: Oropharynx clear and moist Lymphatic: No cervical or supraclavicular adenopathy Lungs no rales or rhonchi, dullness to percussion halfway down left side Heart regular rate and rhythm Abd soft, nontender, positive bowel sounds, no hepatomegaly MSK no focal spinal tenderness; Pleurx catheter left flank. Neuro: non-focal, well-oriented, appropriate affect Breasts: Deferred  LAB RESULTS:  CMP     Component Value Date/Time   NA 142 10/06/2013 1046   NA 134* 09/03/2013 2145   K 3.8 10/06/2013 1046   K 3.7 09/03/2013 2145   CL 97 09/03/2013 2145   CL 105 05/06/2012 1333   CO2 29 10/06/2013 1046   CO2 23 09/03/2013 2145   GLUCOSE 67* 10/06/2013 1046   GLUCOSE 121* 09/03/2013 2145   GLUCOSE 124* 05/06/2012 1333   BUN 13.1 10/06/2013 1046   BUN 8 09/03/2013 2145   CREATININE 0.8 10/06/2013 1046   CREATININE 0.64 09/03/2013 2145   CALCIUM 9.9 10/06/2013 1046   CALCIUM 9.0 09/03/2013 2145   PROT 7.1 10/06/2013 1046   PROT 6.8 09/03/2013 2145   ALBUMIN 3.7 10/06/2013 1046   ALBUMIN 3.3* 09/03/2013 2145   AST 55* 10/06/2013 1046   AST 55* 09/03/2013 2145   ALT 109* 10/06/2013 1046   ALT 85* 09/03/2013 2145   ALKPHOS 86 10/06/2013 1046   ALKPHOS 114 09/03/2013 2145   BILITOT 0.31 10/06/2013 1046   BILITOT 0.3 09/03/2013 2145   GFRNONAA >90 09/03/2013 2145   GFRAA >90 09/03/2013 2145  IResults for  TINY, CHAUDHARY (MRN [id number]) as of 10/09/2013 08:18  Ref. Range 05/16/2013 11:39 05/31/2013 10:03 07/11/2013 08:09 08/04/2013 13:27 09/03/2013 21:45 09/08/2013 07:55 09/12/2013 15:55 10/06/2013 10:46  AST Latest Range: 0-37 U/L 51 (H) 41 (H) 40 (H) 41 (H) 55 (H) 191 (HH) 225 (HH) 55 (H)  ALT Latest Range: 0-35 U/L 87 (H) 71 (H) 66 (H) 81 (H) 85 (H) 292 (HH) 446 (HH) 109 (H)   No results found for this basename: SPEP,  UPEP,   kappa and lambda light chains    Lab Results  Component Value Date   WBC 6.8 10/06/2013   NEUTROABS 4.6 10/06/2013   HGB 14.2 10/06/2013   HCT 42.7 10/06/2013   MCV 93.1 10/06/2013   PLT 388 10/06/2013      Chemistry      Component Value Date/Time   NA 142 10/06/2013 1046   NA 134* 09/03/2013 2145   K 3.8 10/06/2013 1046   K 3.7 09/03/2013 2145   CL 97 09/03/2013 2145   CL 105 05/06/2012 1333   CO2 29 10/06/2013 1046   CO2 23 09/03/2013 2145   BUN 13.1 10/06/2013 1046   BUN 8 09/03/2013 2145   CREATININE 0.8 10/06/2013 1046   CREATININE 0.64 09/03/2013 2145      Component Value Date/Time   CALCIUM 9.9 10/06/2013 1046   CALCIUM 9.0 09/03/2013 2145   ALKPHOS 86 10/06/2013 1046   ALKPHOS 114 09/03/2013 2145   AST 55* 10/06/2013 1046   AST 55* 09/03/2013 2145   ALT 109* 10/06/2013 1046   ALT 85* 09/03/2013 2145   BILITOT 0.31 10/06/2013 1046   BILITOT 0.3 09/03/2013 2145       Lab Results  Component Value Date   LABCA2 25 11/02/2007    No components found with this basename: LABCA125    No results found for this basename: INR,  in the last 168 hours  Urinalysis    Component Value Date/Time   COLORURINE YELLOW 09/03/2013 2109   [place] 09/03/2013 2109   LABSPEC 1.005 09/12/2013 1542   LABSPEC 1.016 09/03/2013 2109   PHURINE 6.0 09/03/2013 2109   GLUCOSEU Negative 09/12/2013 [place] 09/03/2013 2109   HGBUR NEGATIVE 09/03/2013 2109   BILIRUBINUR NEGATIVE 09/03/2013 2109   KETONESUR NEGATIVE 09/03/2013 2109   PROTEINUR NEGATIVE 09/03/2013 2109   UROBILINOGEN 0.2 09/12/2013 1542    UROBILINOGEN 0.2 09/03/2013 2109   NITRITE NEGATIVE 09/03/2013 2109   LEUKOCYTESUR NEGATIVE 09/03/2013 2109    STUDIES: Dg Chest 2 View  10/06/2013   CLINICAL DATA:  Pleural effusion.  Breast cancer.  EXAM: CHEST  2 VIEW  COMPARISON:  09/04/2011.08/16/2013 .  FINDINGS: Mediastinum and hilar structures are normal. Heart size normal with normal pulmonary vascularity. Left chest tube noted with tip projected over the left mid chest. No pneumothorax. Left pleural thickening consistent with effusion noted. Left lower lobe atelectatic changes present. These findings have improved from prior exams. No acute bony abnormality identified. Surgical clips right axilla. Degenerative changes thoracic spine.  IMPRESSION: 1. Left chest tube noted with tip projected over left mid chest. No pneumothorax. 2. Interval improvement left pleural effusion and left lower lobe atelectasis.   Electronically Signed   By: Marcello Moores  Register   On: 10/06/2013 10:55    ASSESSMENT: 53 y.o. [place] woman with stage IV breast cancer, history as follows  (1)  S/p Right lumpectomy and sentinel lymph node sampling 03/15/2004 for a pT1c pN0. Stage IA invasive ductal carcinoma,  grade 2, estrogen receptor 95% positive, progesterone receptor 65% positive, HER-2 not amplified; additional surgery 04/25/2004 for seroma or clearance showed no residual tumor  (2) adjuvant chemotherapy with cyclophosphamide and doxorubicin every 21 days x4 completed 07/19/2004  (3) adjuvant radiation given under Dr. Donella Stade in [place] completed July 2006  (4) the patient opted against adjuvant antiestrogen therapy  (5) genetics testing showed no BRCA mutations  (6) biopsy of a palpable right axillary mass 10/24/2009 showed invasive ductal carcinoma, grade 3, estrogen receptor 100% positive, progesterone receptor 2% positive (alert score 5) HER-2 negative; no evidence of systemic disease on PET scanning  (7) completed 3 of 4 planned cycles of docetaxel and  cyclophosphamide September 2011, fourth cycle omitted because of marked elevations in liver function tests  (8) an right axillary lymph node dissection 03/06/2010 showed 3/8 lymph nodes removed to be involved by tumor, with extracapsular extension.  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, TTS-1 negative), estrogen receptor 80% positive, progesterone receptor 2% positive, HER-2 not amplified  (12) letrozole started March 2015-- tolerated with significant side effects, discontinued at the end of May 20 15th  (13) PET scan 08/16/2013 shows extensive left pleural metastatic disease and a large left pleural effusion that shifts cardiac and mediastinal structures to the right; adenopathy (celiac trunk, periadrenal, periaortic); and a left medial clavicular lesion; Status post left thoracentesis 08/16/2013 positive for adenocarcinoma, estrogen receptor positive, progesterone receptor negative.  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 She is now sufficiently recovered that she can consider resumption of treatment. She likes to have goals, she tells me, and one of her goals is to get rid of the Pleurx catheter, so that she can go back to her prior lymphedema "wraps", which would allow her to travel and visit family. (She does not feel she can go out of state and have lymphedema therapy under her some unknown person somewhere.) Obviously to accomplish that she will need some treatment, and we are following a doctor or [name] [name]' recommendation and resuming letrozole at this point. In the meantime I have suggested that Doloras have her Pleurx drainage once a week, on Fridays, and keep a detailed record of amounts. We would like to see a significant decline over the next 3 months. If so, we will and a stop the drainage wait 3 or 4 weeks, and if there has not been significant reaccumulation we would pull the Pleurx at that point.  She continues to express interest in a "vaccine trial". I urged her again to become  very familiar with trials.gov in terms of understanding study requirements an exclusion criteria.   Her liver function tests are just about back to baseline. Of course she has hepatic steatosis which may produce variably slightly elevated liver function tests. We are going to recheck labs in 3 weeks, and then she will see me again in 6 weeks from now. I am hopeful that we will be able to document a decrease in the Pleurx drainage at that point.   The patient has a good understanding of the overall plan. She agrees with it. She knows the goal of treatment in her case is control. She will call with any problems that may develop before her next visit here.     Chauncey Cruel, MD   10/09/2013 8:26 AM

## 2013-10-10 ENCOUNTER — Ambulatory Visit: Payer: 59 | Admitting: Physical Therapy

## 2013-10-10 ENCOUNTER — Telehealth: Payer: Self-pay | Admitting: Oncology

## 2013-10-10 DIAGNOSIS — IMO0001 Reserved for inherently not codable concepts without codable children: Secondary | ICD-10-CM | POA: Diagnosis not present

## 2013-10-10 NOTE — Telephone Encounter (Signed)
per pof to sch appt-CXR-cld & spoke w/pt to adv of appt-adv pt on 8/20 to go to [facility] for CXR after labs here @ [facility]-pt understood-stated has MY CHART-adv no appt needed for CXR

## 2013-10-12 ENCOUNTER — Ambulatory Visit: Payer: 59 | Admitting: Physical Therapy

## 2013-10-12 DIAGNOSIS — IMO0001 Reserved for inherently not codable concepts without codable children: Secondary | ICD-10-CM | POA: Diagnosis not present

## 2013-10-14 ENCOUNTER — Ambulatory Visit: Payer: 59 | Admitting: Physical Therapy

## 2013-10-14 DIAGNOSIS — IMO0001 Reserved for inherently not codable concepts without codable children: Secondary | ICD-10-CM | POA: Diagnosis not present

## 2013-10-17 ENCOUNTER — Ambulatory Visit: Payer: 59 | Admitting: Physical Therapy

## 2013-10-17 DIAGNOSIS — IMO0001 Reserved for inherently not codable concepts without codable children: Secondary | ICD-10-CM | POA: Diagnosis not present

## 2013-10-19 ENCOUNTER — Ambulatory Visit: Payer: 59 | Admitting: Physical Therapy

## 2013-10-19 DIAGNOSIS — IMO0001 Reserved for inherently not codable concepts without codable children: Secondary | ICD-10-CM | POA: Diagnosis not present

## 2013-10-21 ENCOUNTER — Ambulatory Visit: Payer: 59 | Admitting: Physical Therapy

## 2013-10-21 DIAGNOSIS — IMO0001 Reserved for inherently not codable concepts without codable children: Secondary | ICD-10-CM | POA: Diagnosis not present

## 2013-10-24 ENCOUNTER — Ambulatory Visit: Payer: 59 | Admitting: Physical Therapy

## 2013-10-24 DIAGNOSIS — IMO0001 Reserved for inherently not codable concepts without codable children: Secondary | ICD-10-CM | POA: Diagnosis not present

## 2013-10-26 ENCOUNTER — Ambulatory Visit: Payer: 59 | Admitting: Physical Therapy

## 2013-10-26 DIAGNOSIS — IMO0001 Reserved for inherently not codable concepts without codable children: Secondary | ICD-10-CM | POA: Diagnosis not present

## 2013-10-27 ENCOUNTER — Other Ambulatory Visit (HOSPITAL_BASED_OUTPATIENT_CLINIC_OR_DEPARTMENT_OTHER): Payer: 59

## 2013-10-27 DIAGNOSIS — R7989 Other specified abnormal findings of blood chemistry: Secondary | ICD-10-CM

## 2013-10-27 DIAGNOSIS — C50211 Malignant neoplasm of upper-inner quadrant of right female breast: Secondary | ICD-10-CM

## 2013-10-27 DIAGNOSIS — R945 Abnormal results of liver function studies: Secondary | ICD-10-CM

## 2013-10-27 DIAGNOSIS — C50919 Malignant neoplasm of unspecified site of unspecified female breast: Secondary | ICD-10-CM

## 2013-10-27 DIAGNOSIS — C50419 Malignant neoplasm of upper-outer quadrant of unspecified female breast: Secondary | ICD-10-CM

## 2013-10-27 DIAGNOSIS — J90 Pleural effusion, not elsewhere classified: Secondary | ICD-10-CM

## 2013-10-27 LAB — CBC WITH DIFFERENTIAL/PLATELET
BASO%: 0.5 % (ref 0.0–2.0)
Basophils Absolute: 0 10*3/uL (ref 0.0–0.1)
EOS%: 1.7 % (ref 0.0–7.0)
Eosinophils Absolute: 0.1 10*3/uL (ref 0.0–0.5)
HCT: 40.8 % (ref 34.8–46.6)
HEMOGLOBIN: 13.7 g/dL (ref 11.6–15.9)
LYMPH#: 1.2 10*3/uL (ref 0.9–3.3)
LYMPH%: 20.3 % (ref 14.0–49.7)
MCH: 31.1 pg (ref 25.1–34.0)
MCHC: 33.6 g/dL (ref 31.5–36.0)
MCV: 92.5 fL (ref 79.5–101.0)
MONO#: 0.4 10*3/uL (ref 0.1–0.9)
MONO%: 5.9 % (ref 0.0–14.0)
NEUT#: 4.3 10*3/uL (ref 1.5–6.5)
NEUT%: 71.6 % (ref 38.4–76.8)
Platelets: 322 10*3/uL (ref 145–400)
RBC: 4.41 10*6/uL (ref 3.70–5.45)
RDW: 13.6 % (ref 11.2–14.5)
WBC: 6.1 10*3/uL (ref 3.9–10.3)

## 2013-10-27 LAB — COMPREHENSIVE METABOLIC PANEL (CC13)
ALBUMIN: 3.7 g/dL (ref 3.5–5.0)
ALT: 80 U/L — AB (ref 0–55)
AST: 44 U/L — AB (ref 5–34)
Alkaline Phosphatase: 77 U/L (ref 40–150)
Anion Gap: 8 mEq/L (ref 3–11)
BUN: 12.6 mg/dL (ref 7.0–26.0)
CALCIUM: 9.8 mg/dL (ref 8.4–10.4)
CO2: 28 mEq/L (ref 22–29)
Chloride: 105 mEq/L (ref 98–109)
Creatinine: 1 mg/dL (ref 0.6–1.1)
Glucose: 73 mg/dl (ref 70–140)
POTASSIUM: 3.8 meq/L (ref 3.5–5.1)
Sodium: 142 mEq/L (ref 136–145)
Total Bilirubin: 0.41 mg/dL (ref 0.20–1.20)
Total Protein: 7.1 g/dL (ref 6.4–8.3)

## 2013-10-28 ENCOUNTER — Ambulatory Visit: Payer: 59 | Admitting: Physical Therapy

## 2013-10-28 DIAGNOSIS — IMO0001 Reserved for inherently not codable concepts without codable children: Secondary | ICD-10-CM | POA: Diagnosis not present

## 2013-10-31 ENCOUNTER — Ambulatory Visit: Payer: 59 | Attending: Oncology | Admitting: Physical Therapy

## 2013-10-31 DIAGNOSIS — IMO0001 Reserved for inherently not codable concepts without codable children: Secondary | ICD-10-CM | POA: Diagnosis not present

## 2013-10-31 DIAGNOSIS — I890 Lymphedema, not elsewhere classified: Secondary | ICD-10-CM | POA: Diagnosis not present

## 2013-11-02 ENCOUNTER — Inpatient Hospital Stay (HOSPITAL_COMMUNITY): Admission: RE | Admit: 2013-11-02 | Payer: Self-pay | Source: Ambulatory Visit

## 2013-11-02 ENCOUNTER — Ambulatory Visit: Payer: 59 | Admitting: Physical Therapy

## 2013-11-02 DIAGNOSIS — IMO0001 Reserved for inherently not codable concepts without codable children: Secondary | ICD-10-CM | POA: Diagnosis not present

## 2013-11-04 ENCOUNTER — Ambulatory Visit: Payer: 59 | Admitting: Physical Therapy

## 2013-11-04 DIAGNOSIS — IMO0001 Reserved for inherently not codable concepts without codable children: Secondary | ICD-10-CM | POA: Diagnosis not present

## 2013-11-07 ENCOUNTER — Telehealth: Payer: Self-pay | Admitting: *Deleted

## 2013-11-07 ENCOUNTER — Ambulatory Visit: Payer: 59 | Admitting: Physical Therapy

## 2013-11-07 DIAGNOSIS — IMO0001 Reserved for inherently not codable concepts without codable children: Secondary | ICD-10-CM | POA: Diagnosis not present

## 2013-11-07 NOTE — Telephone Encounter (Signed)
Received vm call from pt stating that she is scheduled for xray next thurs & wants to know if she should be drained or full before xray.  Returned call @ 1:20 pm & pt has pleurex cath.  She only wants to talk with Dr Jana Hakim & ask that I leave a note for him to answer when he returns.  Informed that I would do this. Note to Dr Virgie Dad desk.

## 2013-11-09 ENCOUNTER — Ambulatory Visit: Payer: 59 | Admitting: Physical Therapy

## 2013-11-09 DIAGNOSIS — IMO0001 Reserved for inherently not codable concepts without codable children: Secondary | ICD-10-CM | POA: Diagnosis not present

## 2013-11-11 ENCOUNTER — Ambulatory Visit: Payer: 59 | Admitting: Physical Therapy

## 2013-11-11 DIAGNOSIS — IMO0001 Reserved for inherently not codable concepts without codable children: Secondary | ICD-10-CM | POA: Diagnosis not present

## 2013-11-13 ENCOUNTER — Other Ambulatory Visit: Payer: Self-pay | Admitting: Oncology

## 2013-11-14 ENCOUNTER — Ambulatory Visit: Payer: 59 | Admitting: Physical Therapy

## 2013-11-14 DIAGNOSIS — IMO0001 Reserved for inherently not codable concepts without codable children: Secondary | ICD-10-CM | POA: Diagnosis not present

## 2013-11-16 ENCOUNTER — Ambulatory Visit: Payer: 59 | Admitting: Physical Therapy

## 2013-11-16 DIAGNOSIS — IMO0001 Reserved for inherently not codable concepts without codable children: Secondary | ICD-10-CM | POA: Diagnosis not present

## 2013-11-17 ENCOUNTER — Other Ambulatory Visit (HOSPITAL_COMMUNITY): Payer: Self-pay

## 2013-11-17 ENCOUNTER — Other Ambulatory Visit (HOSPITAL_BASED_OUTPATIENT_CLINIC_OR_DEPARTMENT_OTHER): Payer: 59

## 2013-11-17 ENCOUNTER — Ambulatory Visit (HOSPITAL_COMMUNITY): Payer: Self-pay

## 2013-11-17 ENCOUNTER — Ambulatory Visit (HOSPITAL_COMMUNITY)
Admission: RE | Admit: 2013-11-17 | Discharge: 2013-11-17 | Disposition: A | Discharge status: Home / Self Care | Payer: 59 | Source: Ambulatory Visit | Attending: Oncology | Admitting: Oncology

## 2013-11-17 DIAGNOSIS — J90 Pleural effusion, not elsewhere classified: Secondary | ICD-10-CM

## 2013-11-17 DIAGNOSIS — R945 Abnormal results of liver function studies: Secondary | ICD-10-CM

## 2013-11-17 DIAGNOSIS — C50919 Malignant neoplasm of unspecified site of unspecified female breast: Secondary | ICD-10-CM | POA: Insufficient documentation

## 2013-11-17 DIAGNOSIS — C50419 Malignant neoplasm of upper-outer quadrant of unspecified female breast: Secondary | ICD-10-CM

## 2013-11-17 DIAGNOSIS — C50912 Malignant neoplasm of unspecified site of left female breast: Secondary | ICD-10-CM

## 2013-11-17 DIAGNOSIS — C50211 Malignant neoplasm of upper-inner quadrant of right female breast: Secondary | ICD-10-CM

## 2013-11-17 DIAGNOSIS — R7989 Other specified abnormal findings of blood chemistry: Secondary | ICD-10-CM

## 2013-11-17 LAB — COMPREHENSIVE METABOLIC PANEL (CC13)
ALBUMIN: 3.8 g/dL (ref 3.5–5.0)
ALT: 92 U/L — ABNORMAL HIGH (ref 0–55)
AST: 50 U/L — AB (ref 5–34)
Alkaline Phosphatase: 88 U/L (ref 40–150)
Anion Gap: 10 mEq/L (ref 3–11)
BILIRUBIN TOTAL: 0.38 mg/dL (ref 0.20–1.20)
BUN: 10.7 mg/dL (ref 7.0–26.0)
CO2: 26 mEq/L (ref 22–29)
Calcium: 9.6 mg/dL (ref 8.4–10.4)
Chloride: 101 mEq/L (ref 98–109)
Creatinine: 1.1 mg/dL (ref 0.6–1.1)
Glucose: 79 mg/dl (ref 70–140)
POTASSIUM: 4.1 meq/L (ref 3.5–5.1)
SODIUM: 138 meq/L (ref 136–145)
Total Protein: 7.1 g/dL (ref 6.4–8.3)

## 2013-11-17 LAB — CBC WITH DIFFERENTIAL/PLATELET
BASO%: 0.7 % (ref 0.0–2.0)
Basophils Absolute: 0.1 10*3/uL (ref 0.0–0.1)
EOS%: 2 % (ref 0.0–7.0)
Eosinophils Absolute: 0.1 10*3/uL (ref 0.0–0.5)
HCT: 42.8 % (ref 34.8–46.6)
HGB: 14.3 g/dL (ref 11.6–15.9)
LYMPH%: 20.8 % (ref 14.0–49.7)
MCH: 31.3 pg (ref 25.1–34.0)
MCHC: 33.5 g/dL (ref 31.5–36.0)
MCV: 93.7 fL (ref 79.5–101.0)
MONO#: 0.5 10*3/uL (ref 0.1–0.9)
MONO%: 7.1 % (ref 0.0–14.0)
NEUT#: 4.8 10*3/uL (ref 1.5–6.5)
NEUT%: 69.4 % (ref 38.4–76.8)
PLATELETS: 362 10*3/uL (ref 145–400)
RBC: 4.57 10*6/uL (ref 3.70–5.45)
RDW: 13.9 % (ref 11.2–14.5)
WBC: 7 10*3/uL (ref 3.9–10.3)
lymph#: 1.5 10*3/uL (ref 0.9–3.3)

## 2013-11-18 ENCOUNTER — Other Ambulatory Visit: Payer: Self-pay | Admitting: *Deleted

## 2013-11-18 ENCOUNTER — Telehealth: Payer: Self-pay | Admitting: Oncology

## 2013-11-18 ENCOUNTER — Ambulatory Visit (HOSPITAL_BASED_OUTPATIENT_CLINIC_OR_DEPARTMENT_OTHER): Payer: 59 | Admitting: Oncology

## 2013-11-18 ENCOUNTER — Ambulatory Visit: Payer: 59 | Admitting: Physical Therapy

## 2013-11-18 DIAGNOSIS — R7989 Other specified abnormal findings of blood chemistry: Secondary | ICD-10-CM

## 2013-11-18 DIAGNOSIS — J910 Malignant pleural effusion: Secondary | ICD-10-CM

## 2013-11-18 DIAGNOSIS — C50919 Malignant neoplasm of unspecified site of unspecified female breast: Secondary | ICD-10-CM

## 2013-11-18 DIAGNOSIS — K7689 Other specified diseases of liver: Secondary | ICD-10-CM

## 2013-11-18 DIAGNOSIS — C781 Secondary malignant neoplasm of mediastinum: Secondary | ICD-10-CM

## 2013-11-18 DIAGNOSIS — IMO0001 Reserved for inherently not codable concepts without codable children: Secondary | ICD-10-CM | POA: Diagnosis not present

## 2013-11-18 DIAGNOSIS — R945 Abnormal results of liver function studies: Secondary | ICD-10-CM

## 2013-11-18 DIAGNOSIS — C50211 Malignant neoplasm of upper-inner quadrant of right female breast: Secondary | ICD-10-CM

## 2013-11-18 DIAGNOSIS — J90 Pleural effusion, not elsewhere classified: Secondary | ICD-10-CM

## 2013-11-18 DIAGNOSIS — C773 Secondary and unspecified malignant neoplasm of axilla and upper limb lymph nodes: Secondary | ICD-10-CM

## 2013-11-18 NOTE — Telephone Encounter (Signed)
per pof to sch appt-sch & gave pt copy of sch

## 2013-11-18 NOTE — Progress Notes (Signed)
[place]  Telephone:([phone]) [phone] Fax:(336) [phone]     ID: [name] [name] OB: [birth year]  MR#: [id number]  TDD#:[id number]  PCP: Pcp Not In System GYN:  [name] SU:  OTHER MD: [name], [name], Jae Dire  CHIEF COMPLAINT: Stage IV breast cancer CURRENT TREATMENT: antiestrogens  BREAST CANCER HISTORY: From doctor [name]'s intake note 03/20/2004:  "The patient is a very pleasant 53 year old female, without significant past medical history.  Her family history is significant for a sister who at age 102 was diagnosed with invasive ductal carcinoma.  She is a breast cancer survivor at age 32 now.  The patient states that she has never really had a screening mammogram until October 2005, when she felt that it was time for her to start having mammograms done on a yearly basis.  Therefore, on 01/26/04, she underwent a screening mammogram and an abnormality was detected in the upper outer right breast.  She, therefore, underwent spot compression views of both the right and the left breast.  The left breast revealed a well-defined mass in the upper outer left quadrant, present at the 2 o'clock position, measuring 1.8 cm, 6 cm from the nipple.  This, by ultrasound, was felt to be a simple cyst measuring 1.8 cm.  On the right breast, a spiculated mass was noted in the upper outer right quadrant.  The ultrasound revealed a shadowing irregular solid mass at the 10:30 position, 9 cm from the nipple, measuring 1.2 cm in greatest dimension, correlating with the spiculated mass seen on the mammogram.  The right axilla was negative ultrasonically.  Because of this, the patient underwent a needle biopsy of the right breast and the biopsy was positive invasive mammary carcinoma that showed features consistent with a high-grade invasive ductal carcinoma associated with desmoplastic stroma.  No in situ component was seen and no definite lymphovascular invasion was  identified.  On the core biopsy, the tumor measured about 0.8 cm.  Because of this, she was seen by Dr. [name] and the patient was taken to the [facility] on March 15, 2004.  She underwent a right breast lumpectomy with sentinel node biopsy.  The final pathology revealed an invasive ductal carcinoma, measuring 1.7 cm, grade 2 of 3.  Margins were free of tumor.  Atypical lobular hyperplasia was noted.  One sentinel node was removed which was negative for metastatic disease.  The tumor was staged at T1c, N0 MX.  It was estrogen receptor positive, progesterone receptor positive.  HER-2/neu was 2+.  FISH was negative.  All margins were free of tumor.  She is now seen in Medical Oncology for further evaluation and management of this newly diagnosed T1c, node negative, stage I, invasive ductal carcinoma of the right breast."  Her subsequent history is as detailed below  INTERVAL HISTORY: [name] [name] returns today for followup of her stage IV breast cancer accompanied by her husband [name] [name]. She continues on letrozole daily and she tells me she has not missed any doses. She seems to be tolerating this well, with no problems with hot flashes or vaginal dryness. She continues to have her pleural effusion drained either twice a week or once a week. She brought me the total dose which are a separately scanned. By late June/early July, she appears to be draining less. This made her extremely hopeful. By early to mid-August however the volumes were back to baseline and she became very despondent and feeling that the treatment was "useless". Of course  there are many possible causes for the difference in volume, including the interval between drainage, the fact that she sometimes has pain in types the drainage procedure short, and changes in catheter position. At this point I would say there is no definitive trend regarding the infusions increasing or decreasing from baseline.   REVIEW OF SYSTEMS: [name] [name] remains concerned  about her liver function tests, although these have largely returned to her baseline. She feels that when she is on no treatment her liver function tests are "normal". She has a "sensitive liver". She has occasional nausea problems, if she eats something too early in the morning or even when she brushes her teeth. She usually starts today with the decaffeinated coffee, then proceeds to regular coffee. She continues to receive daily physical therapy for lymphedema. She has stopped multiple medications including magnesium, Skelaxin, naproxen, Prilosec, and oxycodone. She is trying to exercise daily. She is anxious about having repeat mammography, and sees no point in having further mammograms. She was does not opt to having her vitals checked today. A detailed review of systems was otherwise stable  PAST MEDICAL HISTORY: Past Medical History  Diagnosis Date  . Seizures 2010    Isolated incident.  . Breast cancer dx'd 2005/2011  . PONV (postoperative nausea and vomiting)   . Peripheral vascular disease 02/2010    blood clot related to porta cath    PAST SURGICAL HISTORY: Past Surgical History  Procedure Laterality Date  . Breast lumpectomy  2005  . Axillary lymph node dissection  Dec. 2011  . Portacath placement  12/11  . Removal portacath    . Mediastinotomy chamberlain mcneil Left 06/02/2013    Procedure: MEDIASTINOTOMY CHAMBERLAIN MCNEIL;  Surgeon: [name], MD;  Location: [facility];  Service: Thoracic;  Laterality: Left;  LEFT ANTERIOR MEDIASTINOTOMY     FAMILY HISTORY No family history on file. The patient's father is living, 69 years old as of may 2015. He lives in [place]. The patient's mother died from complications of COPD at the age of 68. These has 2 brothers, one sister. Her sister developed breast cancer at the age of 32. She is doing well. The patient herself underwent genetic testing at [facility] in 2011 and was found to be BRCA negative  GYNECOLOGIC HISTORY:  Menarche  age 44, she is GX P0. She stopped having periods with her initial chemotherapy in 2006.  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 Her husband [name] [name] is a Medical illustrator in [place]. He has a child from a prior marriage. At home they have 2 rescue dogs, Hobo and [place]. The patient is religious but not a church attender    ADVANCED DIRECTIVES:    HEALTH MAINTENANCE: History  Substance Use Topics  . Smoking status: Never Smoker   . Smokeless tobacco: Never Used  . Alcohol Use: No     Colonoscopy:  PAP:  Bone density:  Lipid panel:  Allergies  Allergen Reactions  . Decadron [Dexamethasone] Other (See Comments)    Patient does not tolerate steroids.   . Hydromorphone Hcl Nausea And Vomiting  . Morphine And Related   . Tegaderm Ag Mesh [Silver]     Current Outpatient Prescriptions  Medication Sig Dispense Refill  . ALPRAZolam (XANAX) 0.5 MG tablet Take 1 tablet (0.5 mg total) by mouth 2 (two) times daily as needed for anxiety.  30 tablet  0  . b complex vitamins  capsule Take 1 capsule by mouth daily.      . Calcium-Magnesium-Vitamin D (CITRACAL CALCIUM+D PO) Take by mouth. 485m vitamin C, 509mvitamin D3 2 PO DAILY      . folic acid (FOLVITE) 1 MG tablet Take 1 mg by mouth daily.      . Marland Kitchenetrozole (FEMARA) 2.5 MG tablet Take 2.5 mg by mouth daily.      . Magnesium 100 MG CAPS Take 250 each by mouth daily. Taking 250 mg per day      . Melatonin 1 MG TABS Take 3 mg by mouth at bedtime as needed (sleep).       . metaxalone (SKELAXIN) 800 MG tablet Take 0.5 tablets (400 mg total) by mouth 3 (three) times daily as needed for muscle spasms.  30 tablet  6  . naproxen (NAPROSYN) 500 MG tablet Take 500 mg by mouth 3 (three) times daily as needed for moderate pain.      . naproxen sodium (ANAPROX) 220 MG tablet Take 220 mg by mouth 2 (two) times daily as needed (pain).      . Marland Kitchenmeprazole (PRILOSEC) 40 MG capsule  Take 1 capsule (40 mg total) by mouth daily.  30 capsule  12  . oxyCODONE (OXY IR/ROXICODONE) 5 MG immediate release tablet Take 1-2 tablets (5-10 mg total) by mouth every 4 (four) hours as needed for moderate pain.  30 tablet  0  . VITAMIN D, CHOLECALCIFEROL, PO Take 2,000 Units by mouth daily.       . [DISCONTINUED] metoCLOPramide (REGLAN) 10 MG tablet Take 1 tablet (10 mg total) by mouth 4 (four) times daily -  before meals and at bedtime.  60 tablet  3   No current facility-administered medications for this visit.    OBJECTIVE: Middle-aged white woman who appears stated age Fi7itals:     Body mass index is 0.00 kg/(m^2).   Filed Vitals:   Patient refused weight and vitals today    ECOG FS:1 - Symptomatic but completely ambulatory  Ocular: Sclerae unicteric, pupils round and r equal Ear-nose-throat: Oropharynx clear and moist, teeth in good repair Lymphatic: No cervical or supraclavicular adenopathy Lungs no rales or rhonchi, dullness to percussion one third of the way up left side Heart regular rate and rhythm Abd soft, nontender, positive bowel sounds, no hepatomegaly MSK no focal spinal tenderness; Pleurx catheter left flank without erythema or swelling at the exit site Neuro: non-focal, well-oriented, anxious and depressed affect Breasts: Deferred  LAB RESULTS:  CMP     Component Value Date/Time   NA 138 11/17/2013 1021   NA 134* 09/03/2013 2145   K 4.1 11/17/2013 1021   K 3.7 09/03/2013 2145   CL 97 09/03/2013 2145   CL 105 05/06/2012 1333   CO2 26 11/17/2013 1021   CO2 23 09/03/2013 2145   GLUCOSE 79 11/17/2013 1021   GLUCOSE 121* 09/03/2013 2145   GLUCOSE 124* 05/06/2012 1333   BUN 10.7 11/17/2013 1021   BUN 8 09/03/2013 2145   CREATININE 1.1 11/17/2013 1021   CREATININE 0.64 09/03/2013 2145   CALCIUM 9.6 11/17/2013 1021   CALCIUM 9.0 09/03/2013 2145   PROT 7.1 11/17/2013 1021   PROT 6.8 09/03/2013 2145   ALBUMIN 3.8 11/17/2013 1021   ALBUMIN 3.3* 09/03/2013 2145   AST 50* 11/17/2013  1021   AST 55* 09/03/2013 2145   ALT 92* 11/17/2013 1021   ALT 85* 09/03/2013 2145   ALKPHOS 88 11/17/2013 1021   ALKPHOS 114 09/03/2013 2145  BILITOT 0.38 11/17/2013 1021   BILITOT 0.3 09/03/2013 2145   GFRNONAA >90 09/03/2013 2145   GFRAA >90 09/03/2013 2145    No results found for this basename: SPEP,  UPEP,   kappa and lambda light chains    Lab Results  Component Value Date   WBC 7.0 11/17/2013   NEUTROABS 4.8 11/17/2013   HGB 14.3 11/17/2013   HCT 42.8 11/17/2013   MCV 93.7 11/17/2013   PLT 362 11/17/2013  Results for TYRENE, NADER (MRN [id number]) as of 11/19/2013 09:27  Ref. Range 09/08/2013 07:55 09/12/2013 15:55 10/06/2013 10:46 10/27/2013 10:32 11/17/2013 10:21  ALT Latest Range: 0-35 U/L 292 (HH) 446 (HH) 109 (H) 80 (H) 92 (H)      Chemistry      Component Value Date/Time   NA 138 11/17/2013 1021   NA 134* 09/03/2013 2145   K 4.1 11/17/2013 1021   K 3.7 09/03/2013 2145   CL 97 09/03/2013 2145   CL 105 05/06/2012 1333   CO2 26 11/17/2013 1021   CO2 23 09/03/2013 2145   BUN 10.7 11/17/2013 1021   BUN 8 09/03/2013 2145   CREATININE 1.1 11/17/2013 1021   CREATININE 0.64 09/03/2013 2145      Component Value Date/Time   CALCIUM 9.6 11/17/2013 1021   CALCIUM 9.0 09/03/2013 2145   ALKPHOS 88 11/17/2013 1021   ALKPHOS 114 09/03/2013 2145   AST 50* 11/17/2013 1021   AST 55* 09/03/2013 2145   ALT 92* 11/17/2013 1021   ALT 85* 09/03/2013 2145   BILITOT 0.38 11/17/2013 1021   BILITOT 0.3 09/03/2013 2145       Lab Results  Component Value Date   LABCA2 25 11/02/2007    No components found with this basename: LABCA125    No results found for this basename: INR,  in the last 168 hours  Urinalysis    Component Value Date/Time   COLORURINE YELLOW 09/03/2013 2109   [place] 09/03/2013 2109   LABSPEC 1.005 09/12/2013 1542   LABSPEC 1.016 09/03/2013 2109   PHURINE 6.0 09/03/2013 2109   GLUCOSEU Negative 09/12/2013 [place] 09/03/2013 2109   HGBUR NEGATIVE 09/03/2013 2109   BILIRUBINUR  NEGATIVE 09/03/2013 2109   KETONESUR NEGATIVE 09/03/2013 2109   PROTEINUR NEGATIVE 09/03/2013 2109   UROBILINOGEN 0.2 09/12/2013 1542   UROBILINOGEN 0.2 09/03/2013 2109   NITRITE NEGATIVE 09/03/2013 2109   LEUKOCYTESUR NEGATIVE 09/03/2013 2109    STUDIES: Dg Chest 2 View  11/17/2013   CLINICAL DATA:  Followup effusion. History of breast cancer with metastatic disease to the chest wall.  EXAM: CHEST  2 VIEW  COMPARISON:  10/06/2013  FINDINGS: Left-sided chest tube has been slightly repositioned as the loop over the mid segment of the tube is now gone as the tube courses into the region of the apex and loops posteriorly with tip over the posterior upper pleural space. No evidence of pneumothorax. There is continued volume loss of the left lung with elevation of the left hemidiaphragm unchanged. There is worsening of a moderate size left pleural effusion likely with associated atelectasis. Right lung is clear. Remainder of the exam is unchanged.  IMPRESSION: Stable volume loss of the left lung with worsening moderate size left pleural effusion likely with associated atelectasis. Left-sided chest tube in place with tip over the posterior upper pleural space. No evidence of pneumothorax.   Electronically Signed   By: Marin Olp M.D.   On: 11/17/2013 10:19    ASSESSMENT: 53  y.o. [name] [name] woman with stage IV breast cancer, history as follows  (1)  S/p Right lumpectomy and sentinel lymph node sampling 03/15/2004 for a pT1c pN0. Stage IA invasive ductal carcinoma, grade 2, estrogen receptor 95% positive, progesterone receptor 65% positive, HER-2 not amplified; additional surgery 04/25/2004 for seroma or clearance showed no residual tumor  (2) adjuvant chemotherapy with cyclophosphamide and doxorubicin every 21 days x4 completed 07/19/2004  (3) adjuvant radiation given under Dr. Donella Stade in [place] completed July 2006  (4) the patient opted against adjuvant antiestrogen therapy  (5) genetics testing showed  no BRCA mutations  (6) biopsy of a palpable right axillary mass 10/24/2009 showed invasive ductal carcinoma, grade 3, estrogen receptor 100% positive, progesterone receptor 2% positive (alert score 5) HER-2 negative; no evidence of systemic disease on PET scanning  (7) completed 3 of 4 planned cycles of docetaxel and cyclophosphamide September 2011, fourth cycle omitted because of marked elevations in liver function tests  (8) an right axillary lymph node dissection 03/06/2010 showed 3/8 lymph nodes removed to be involved by tumor, with extracapsular extension.  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, TTS-1 negative), estrogen receptor 80% positive, progesterone receptor 2% positive, HER-2 not amplified  (12) letrozole started March 2015-- tolerated with significant side effects, discontinued at the end of May 2015  (13) PET scan 08/16/2013 shows extensive left pleural metastatic disease and a large left pleural effusion that shifts cardiac and mediastinal structures to the right; adenopathy (celiac trunk, periadrenal, periaortic); and a left medial clavicular lesion; Status post left thoracentesis 08/16/2013 positive for adenocarcinoma, estrogen receptor positive, progesterone receptor negative.  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 The chest x-ray and the drainage figures show that we are not seeing evidence of improvement with the Femara. However we are also not seeing evidence of progression. She has only been on the Femara about 5 weeks. I am not ready to give up on this drug, and the plan is going to be to continue it another 2 months.Myeshia will continue to keep close records of the drainage volumes, and of course she will let me know if there is any problem regarding infection, fever, worsening shortness of breath, or pain related to her Pleurx.  She is uncomfortable when more than 500 cc are drained at any one time. We are going to try to get away with once a week drainage up to 500 or at most 600 cc at week. She is also concerned regarding the risk of infection other possible "organ damage" from the Pleurx. Not discounting her concerns,  the alternative would be to let her left lung fill up with fluid or do repeated thoracentesis, neither of which would improve on her current situation.  She does not want to have further mammographies. I would be comfortable with mammographies every other year. Accordingly we will cancel the scheduled mammogram this year. She is willing to spend of as much money as needed to get a full genetic panel such as Foundation 1. Before embarking on that I would like to consult with Dr. Philipp Ovens at [facility] and see if she has any better ideas. In any case I advised Makaylie that even if we find another actionable mutation, the most important driver of her tumor is estrogen, and she is unlikely  to find anything as effective as anti-estrogens in terms of treating her tumor.  She is concerned regarding her liver function tests. These are now "back to baseline". She does have fatty liver. She feels her fatty liver is due to the antiestrogen therapy. That may be the case, since she does have a very sensitive liver, it appears. In any case her LFTs currently are not worrisome to me and we certainly should continue the Femara as we are doing.  If we document progression, my plan is to go to fulvestrant and Palbociclib.  Yatzari's situation is very difficult. It is normal for her to get unreasonably hopeful when there is less fluid drained, and unreasonably despondent when it turns out that that was a false hope. She is exercising regularly, she is maintaining a good diet, and she is being compliant with her treatments. She is exploring her options I think in a very reasonable manner. I affirmed her approach and I really think she is working just as hard as she can to bring this cancer under control and beat back if possible.   She will see me again in September 25. She will have a chest x-ray 2 days before that and lab work that day as well. In October we will repeat a CT scan. At that time we will consider switching therapy. By then we should also have results of the Foundation 1 or other genetic testing.      Chauncey Cruel, MD   11/18/2013 9:04 AM

## 2013-11-21 ENCOUNTER — Ambulatory Visit: Payer: 59 | Admitting: Physical Therapy

## 2013-11-21 DIAGNOSIS — IMO0001 Reserved for inherently not codable concepts without codable children: Secondary | ICD-10-CM | POA: Diagnosis not present

## 2013-11-21 NOTE — Addendum Note (Signed)
Addended by: Renford Dills on: 11/21/2013 11:58 AM   Modules accepted: Orders, Medications

## 2013-11-22 ENCOUNTER — Telehealth: Payer: Self-pay | Admitting: *Deleted

## 2013-11-22 NOTE — Telephone Encounter (Signed)
Foundation One request sent to scan

## 2013-11-23 ENCOUNTER — Telehealth: Payer: Self-pay | Admitting: *Deleted

## 2013-11-23 ENCOUNTER — Ambulatory Visit: Payer: 59 | Admitting: Physical Therapy

## 2013-11-23 DIAGNOSIS — IMO0001 Reserved for inherently not codable concepts without codable children: Secondary | ICD-10-CM | POA: Diagnosis not present

## 2013-11-23 NOTE — Telephone Encounter (Signed)
Per Dr. Jana Hakim, it's OK to have additional nursing visits to drain the pleurex catheter. I called Marlis Edelson, RN, who is patient's home health nurse. Caitlyn's cell number is [phone]. Instructed Caitlyn to call back if she had any further questions or concerns.

## 2013-11-24 ENCOUNTER — Other Ambulatory Visit (HOSPITAL_COMMUNITY)
Admission: RE | Admit: 2013-11-24 | Discharge: 2013-11-24 | Disposition: A | Discharge status: Home / Self Care | Payer: 59 | Source: Ambulatory Visit | Attending: Oncology | Admitting: Oncology

## 2013-11-24 DIAGNOSIS — C800 Disseminated malignant neoplasm, unspecified: Secondary | ICD-10-CM | POA: Insufficient documentation

## 2013-11-24 DIAGNOSIS — C50919 Malignant neoplasm of unspecified site of unspecified female breast: Secondary | ICD-10-CM | POA: Diagnosis present

## 2013-11-24 DIAGNOSIS — C50219 Malignant neoplasm of upper-inner quadrant of unspecified female breast: Secondary | ICD-10-CM | POA: Insufficient documentation

## 2013-11-25 ENCOUNTER — Ambulatory Visit: Payer: 59 | Admitting: Physical Therapy

## 2013-11-25 DIAGNOSIS — IMO0001 Reserved for inherently not codable concepts without codable children: Secondary | ICD-10-CM | POA: Diagnosis not present

## 2013-11-28 ENCOUNTER — Ambulatory Visit: Payer: 59 | Admitting: Physical Therapy

## 2013-11-28 DIAGNOSIS — IMO0001 Reserved for inherently not codable concepts without codable children: Secondary | ICD-10-CM | POA: Diagnosis not present

## 2013-11-30 ENCOUNTER — Ambulatory Visit: Payer: 59 | Attending: Oncology | Admitting: Physical Therapy

## 2013-11-30 DIAGNOSIS — I890 Lymphedema, not elsewhere classified: Secondary | ICD-10-CM | POA: Diagnosis not present

## 2013-11-30 DIAGNOSIS — IMO0001 Reserved for inherently not codable concepts without codable children: Secondary | ICD-10-CM | POA: Diagnosis not present

## 2013-12-01 ENCOUNTER — Telehealth: Payer: Self-pay | Admitting: *Deleted

## 2013-12-01 NOTE — Telephone Encounter (Signed)
Message left on 9/2 by pt requesting a return call.  This RN returned call this am and obtained automated identification of correct call back number.  This RN left message for pt to return call to this RN.

## 2013-12-02 ENCOUNTER — Ambulatory Visit: Payer: 59 | Admitting: Physical Therapy

## 2013-12-02 DIAGNOSIS — IMO0001 Reserved for inherently not codable concepts without codable children: Secondary | ICD-10-CM | POA: Diagnosis not present

## 2013-12-07 ENCOUNTER — Ambulatory Visit: Payer: 59 | Admitting: Physical Therapy

## 2013-12-07 DIAGNOSIS — IMO0001 Reserved for inherently not codable concepts without codable children: Secondary | ICD-10-CM | POA: Diagnosis not present

## 2013-12-09 ENCOUNTER — Ambulatory Visit: Payer: 59 | Admitting: Physical Therapy

## 2013-12-09 DIAGNOSIS — IMO0001 Reserved for inherently not codable concepts without codable children: Secondary | ICD-10-CM | POA: Diagnosis not present

## 2013-12-12 ENCOUNTER — Encounter (HOSPITAL_COMMUNITY): Payer: Self-pay

## 2013-12-12 ENCOUNTER — Other Ambulatory Visit: Payer: Self-pay | Admitting: Oncology

## 2013-12-12 ENCOUNTER — Ambulatory Visit: Payer: 59 | Admitting: Physical Therapy

## 2013-12-12 DIAGNOSIS — IMO0001 Reserved for inherently not codable concepts without codable children: Secondary | ICD-10-CM | POA: Diagnosis not present

## 2013-12-13 ENCOUNTER — Encounter (HOSPITAL_COMMUNITY): Payer: Self-pay

## 2013-12-14 ENCOUNTER — Encounter: Payer: Self-pay | Admitting: Physical Therapy

## 2013-12-15 ENCOUNTER — Other Ambulatory Visit: Payer: Self-pay | Admitting: Oncology

## 2013-12-16 ENCOUNTER — Ambulatory Visit: Payer: 59 | Admitting: Physical Therapy

## 2013-12-16 DIAGNOSIS — IMO0001 Reserved for inherently not codable concepts without codable children: Secondary | ICD-10-CM | POA: Diagnosis not present

## 2013-12-19 ENCOUNTER — Ambulatory Visit: Payer: 59 | Admitting: Physical Therapy

## 2013-12-19 DIAGNOSIS — IMO0001 Reserved for inherently not codable concepts without codable children: Secondary | ICD-10-CM | POA: Diagnosis not present

## 2013-12-21 ENCOUNTER — Encounter: Payer: Self-pay | Admitting: Physical Therapy

## 2013-12-21 ENCOUNTER — Ambulatory Visit (HOSPITAL_COMMUNITY)
Admission: RE | Admit: 2013-12-21 | Discharge: 2013-12-21 | Disposition: A | Discharge status: Home / Self Care | Payer: 59 | Source: Ambulatory Visit | Attending: Oncology | Admitting: Oncology

## 2013-12-21 ENCOUNTER — Other Ambulatory Visit: Payer: Self-pay

## 2013-12-21 ENCOUNTER — Ambulatory Visit (HOSPITAL_BASED_OUTPATIENT_CLINIC_OR_DEPARTMENT_OTHER): Payer: 59

## 2013-12-21 DIAGNOSIS — J9819 Other pulmonary collapse: Secondary | ICD-10-CM | POA: Diagnosis not present

## 2013-12-21 DIAGNOSIS — R7989 Other specified abnormal findings of blood chemistry: Secondary | ICD-10-CM

## 2013-12-21 DIAGNOSIS — Z853 Personal history of malignant neoplasm of breast: Secondary | ICD-10-CM | POA: Insufficient documentation

## 2013-12-21 DIAGNOSIS — R945 Abnormal results of liver function studies: Secondary | ICD-10-CM

## 2013-12-21 DIAGNOSIS — C50919 Malignant neoplasm of unspecified site of unspecified female breast: Secondary | ICD-10-CM

## 2013-12-21 DIAGNOSIS — C50419 Malignant neoplasm of upper-outer quadrant of unspecified female breast: Secondary | ICD-10-CM

## 2013-12-21 DIAGNOSIS — C773 Secondary and unspecified malignant neoplasm of axilla and upper limb lymph nodes: Secondary | ICD-10-CM

## 2013-12-21 DIAGNOSIS — J90 Pleural effusion, not elsewhere classified: Secondary | ICD-10-CM | POA: Diagnosis not present

## 2013-12-21 DIAGNOSIS — C50211 Malignant neoplasm of upper-inner quadrant of right female breast: Secondary | ICD-10-CM

## 2013-12-21 LAB — COMPREHENSIVE METABOLIC PANEL (CC13)
ALK PHOS: 81 U/L (ref 40–150)
ALT: 97 U/L — ABNORMAL HIGH (ref 0–55)
AST: 52 U/L — AB (ref 5–34)
Albumin: 3.5 g/dL (ref 3.5–5.0)
Anion Gap: 7 mEq/L (ref 3–11)
BILIRUBIN TOTAL: 0.38 mg/dL (ref 0.20–1.20)
BUN: 12.1 mg/dL (ref 7.0–26.0)
CHLORIDE: 105 meq/L (ref 98–109)
CO2: 28 mEq/L (ref 22–29)
CREATININE: 0.9 mg/dL (ref 0.6–1.1)
Calcium: 9.2 mg/dL (ref 8.4–10.4)
Glucose: 95 mg/dl (ref 70–140)
Potassium: 3.6 mEq/L (ref 3.5–5.1)
Sodium: 140 mEq/L (ref 136–145)
Total Protein: 6.7 g/dL (ref 6.4–8.3)

## 2013-12-21 LAB — CBC WITH DIFFERENTIAL/PLATELET
BASO%: 0.6 % (ref 0.0–2.0)
BASOS ABS: 0 10*3/uL (ref 0.0–0.1)
EOS%: 1.9 % (ref 0.0–7.0)
Eosinophils Absolute: 0.1 10*3/uL (ref 0.0–0.5)
HEMATOCRIT: 41.5 % (ref 34.8–46.6)
HEMOGLOBIN: 13.6 g/dL (ref 11.6–15.9)
LYMPH%: 18.8 % (ref 14.0–49.7)
MCH: 31.3 pg (ref 25.1–34.0)
MCHC: 32.9 g/dL (ref 31.5–36.0)
MCV: 95.1 fL (ref 79.5–101.0)
MONO#: 0.4 10*3/uL (ref 0.1–0.9)
MONO%: 5.7 % (ref 0.0–14.0)
NEUT#: 4.7 10*3/uL (ref 1.5–6.5)
NEUT%: 73 % (ref 38.4–76.8)
PLATELETS: 339 10*3/uL (ref 145–400)
RBC: 4.36 10*6/uL (ref 3.70–5.45)
RDW: 13.8 % (ref 11.2–14.5)
WBC: 6.4 10*3/uL (ref 3.9–10.3)
lymph#: 1.2 10*3/uL (ref 0.9–3.3)

## 2013-12-22 ENCOUNTER — Encounter: Payer: Self-pay | Admitting: *Deleted

## 2013-12-23 ENCOUNTER — Ambulatory Visit: Payer: 59 | Admitting: Physical Therapy

## 2013-12-23 ENCOUNTER — Ambulatory Visit (HOSPITAL_BASED_OUTPATIENT_CLINIC_OR_DEPARTMENT_OTHER): Payer: 59 | Admitting: Oncology

## 2013-12-23 ENCOUNTER — Telehealth: Payer: Self-pay | Admitting: Oncology

## 2013-12-23 DIAGNOSIS — C50919 Malignant neoplasm of unspecified site of unspecified female breast: Secondary | ICD-10-CM

## 2013-12-23 DIAGNOSIS — C781 Secondary malignant neoplasm of mediastinum: Secondary | ICD-10-CM

## 2013-12-23 DIAGNOSIS — C773 Secondary and unspecified malignant neoplasm of axilla and upper limb lymph nodes: Secondary | ICD-10-CM

## 2013-12-23 DIAGNOSIS — C50211 Malignant neoplasm of upper-inner quadrant of right female breast: Secondary | ICD-10-CM

## 2013-12-23 DIAGNOSIS — J90 Pleural effusion, not elsewhere classified: Secondary | ICD-10-CM

## 2013-12-23 DIAGNOSIS — IMO0001 Reserved for inherently not codable concepts without codable children: Secondary | ICD-10-CM | POA: Diagnosis not present

## 2013-12-23 DIAGNOSIS — R945 Abnormal results of liver function studies: Secondary | ICD-10-CM

## 2013-12-23 DIAGNOSIS — R7989 Other specified abnormal findings of blood chemistry: Secondary | ICD-10-CM

## 2013-12-23 DIAGNOSIS — J910 Malignant pleural effusion: Secondary | ICD-10-CM

## 2013-12-23 NOTE — Progress Notes (Signed)
[name] [name]  Telephone:(336) [phone] Fax:(336) [phone]     ID: [name] [name] OB: May 24, 1960  MR#: [id number]  PPI#:[id number]  PCP: Pcp Not In System GYN:  [name] [name] SU:  OTHER MD: [name] [name], [name] [name], [name] [name]  CHIEF COMPLAINT: Stage IV breast cancer CURRENT TREATMENT: Letrozole  BREAST CANCER HISTORY: From doctor [name] [name]'s intake note 03/20/2004:  "The patient is a very pleasant 53 year old female, without significant past medical history.  Her family history is significant for a sister who at age 50 was diagnosed with invasive ductal carcinoma.  She is a breast cancer survivor at age 64 now.  The patient states that she has never really had a screening mammogram until October 2005, when she felt that it was time for her to start having mammograms done on a yearly basis.  Therefore, on 01/26/04, she underwent a screening mammogram and an abnormality was detected in the upper outer right breast.  She, therefore, underwent spot compression views of both the right and the left breast.  The left breast revealed a well-defined mass in the upper outer left quadrant, present at the 2 o'clock position, measuring 1.8 cm, 6 cm from the nipple.  This, by ultrasound, was felt to be a simple cyst measuring 1.8 cm.  On the right breast, a spiculated mass was noted in the upper outer right quadrant.  The ultrasound revealed a shadowing irregular solid mass at the 10:30 position, 9 cm from the nipple, measuring 1.2 cm in greatest dimension, correlating with the spiculated mass seen on the mammogram.  The right axilla was negative ultrasonically.  Because of this, the patient underwent a needle biopsy of the right breast and the biopsy was positive invasive mammary carcinoma that showed features consistent with a high-grade invasive ductal carcinoma associated with desmoplastic stroma.  No in situ component was seen and no definite lymphovascular invasion was identified.   On the core biopsy, the tumor measured about 0.8 cm.  Because of this, she was seen by Dr. [name] and the patient was taken to the [place] on March 15, 2004.  She underwent a right breast lumpectomy with sentinel node biopsy.  The final pathology revealed an invasive ductal carcinoma, measuring 1.7 cm, grade 2 of 3.  Margins were free of tumor.  Atypical lobular hyperplasia was noted.  One sentinel node was removed which was negative for metastatic disease.  The tumor was staged at T1c, N0 MX.  It was estrogen receptor positive, progesterone receptor positive.  HER-2/neu was 2+.  FISH was negative.  All margins were free of tumor.  She is now seen in Medical Oncology for further evaluation and management of this newly diagnosed T1c, node negative, stage I, invasive ductal carcinoma of the right breast."  Her subsequent history is as detailed below  INTERVAL HISTORY: [name] [name] returns today for followup of her stage IV breast cancer accompanied by her husband [name] [name]. She continues on letrozole, and does complain of hot flashes related to that. Vaginal dryness is not a major issue. She has her left effusion drained every Monday and Thursday and she brought me the drainage volumes for the last 5 weeks. They range from 150 cc to 450 cc. There is no clear trend. She tolerates the procedures without any side effects that she has learned not to try to drain the last drop, which otherwise causes pain.   REVIEW OF SYSTEMS: [name] [name] is walking just about every day and also going to the gym  once a week. Recently she had to run after her dog, and was very short of breath after that, but she did manage to catch the dog. There has been no fever or bleeding. Usually she does well in the morning but by the middle of the afternoon she has to take it easy. She does not nap. She does sleep poorly and complains of insomnia. She takes melatonin in the evening. She feels her peripheral neuropathy is getting worse. She has  tingling in her fingers and toes, and occasional she gets swelling in the ankles and also in her fingers. She tends to blame this on the letrozole. She has a loose stool every day and sometimes 2 or 3, but there has been no change there and all of the stools tend to occur within a period of one or two-hour so she's fine the rest of the day. She has pain in the left side every once in a while but she controls that with minimal pain medications. There have been no unusual headaches, visual changes, nausea or vomiting. A detailed review of systems today was otherwise stable  PAST MEDICAL HISTORY: Past Medical History  Diagnosis Date  . Seizures 2010    Isolated incident.  . Breast cancer dx'd 2005/2011  . PONV (postoperative nausea and vomiting)   . Peripheral vascular disease 02/2010    blood clot related to porta cath    PAST SURGICAL HISTORY: Past Surgical History  Procedure Laterality Date  . Breast lumpectomy  2005  . Axillary lymph node dissection  Dec. 2011  . Portacath placement  12/11  . Removal portacath    . Mediastinotomy chamberlain mcneil Left 06/02/2013    Procedure: MEDIASTINOTOMY CHAMBERLAIN MCNEIL;  Surgeon: [name], MD;  Location: [place];  Service: Thoracic;  Laterality: Left;  LEFT ANTERIOR MEDIASTINOTOMY     FAMILY HISTORY No family history on file. The patient's father is living, 49 years old as of may 2015. He lives in [place]. The patient's mother died from complications of COPD at the age of 58. These has 2 brothers, one sister. Her sister developed breast cancer at the age of 29. She is doing well. The patient herself underwent genetic testing at [facility] in 2011 and was found to be BRCA negative  GYNECOLOGIC HISTORY:  Menarche age 38, she is GX P0. She stopped having periods with her initial chemotherapy in 2006.  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 Her husband [name] [name] is  a Medical illustrator in [place]. He has a child from a prior marriage. At home they have 2 rescue dogs, Hobo and Kilgore. The patient is religious but not a church attender    ADVANCED DIRECTIVES:    HEALTH MAINTENANCE: History  Substance Use Topics  . Smoking status: Never Smoker   . Smokeless tobacco: Never Used  . Alcohol Use: No     Colonoscopy:  PAP:  Bone density:  Lipid panel:  Allergies  Allergen Reactions  . Decadron [Dexamethasone] Other (See Comments)    Patient does not tolerate steroids.   . Enoxaparin Other (See Comments)    unknown  . Hydromorphone Hcl Nausea And Vomiting  . Morphine And Related   . Tegaderm Ag Mesh [Silver]     Current Outpatient Prescriptions  Medication Sig Dispense Refill  . ALPRAZolam (XANAX) 0.5 MG tablet Take 1 tablet (0.5 mg total) by mouth 2 (two) times daily as  needed for anxiety.  30 tablet  0  . b complex vitamins capsule Take 1 capsule by mouth daily.      . Calcium-Magnesium-Vitamin D (CITRACAL CALCIUM+D PO) Take by mouth. 471m vitamin C, 5066mvitamin D3 2 PO DAILY      . folic acid (FOLVITE) 1 MG tablet Take 1 mg by mouth daily.      . Marland Kitchenbuprofen (ADVIL,MOTRIN) 200 MG tablet Take 200 mg by mouth every 4 (four) hours as needed.      . Marland Kitchenetrozole (FEMARA) 2.5 MG tablet Take 2.5 mg by mouth daily.      . Melatonin 1 MG TABS Take 3 mg by mouth at bedtime as needed (sleep).       . ondansetron (ZOFRAN) 8 MG tablet       . palbociclib (IBRANCE) 125 MG capsule Take 125 mg by mouth.      . Marland KitchenITAMIN D, CHOLECALCIFEROL, PO Take 2,000 Units by mouth daily.       . [DISCONTINUED] metoCLOPramide (REGLAN) 10 MG tablet Take 1 tablet (10 mg total) by mouth 4 (four) times daily -  before meals and at bedtime.  60 tablet  3   No current facility-administered medications for this visit.    OBJECTIVE: Middle-aged white woman who appears stated age There were no vitals filed for this visit.   Body mass index is 0.00 kg/(m^2).   There were no  vitals filed for this visit. Patient refused weight and vitals again today    ECOG FS:1 - Symptomatic but completely ambulatory  Ocular: Sclerae unicteric, EOMs intact Ear-nose-throat: Oropharynx clear and moist Lymphatic: No cervical or supraclavicular adenopathy Lungs no rales or rhonchi, dullness to percussion left base as before Heart regular rate and rhythm Abd soft, nontender, positive bowel sounds, no masses palpated MSK no focal spinal tenderness; Pleurx catheter left flank without erythema or swelling at the exit site Neuro: non-focal, well-oriented, appropriate affect Breasts: Deferred  Results of drainage volumes are separately scanned  LAB RESULTS:  CMP     Component Value Date/Time   NA 140 12/21/2013 1150   NA 134* 09/03/2013 2145   K 3.6 12/21/2013 1150   K 3.7 09/03/2013 2145   CL 97 09/03/2013 2145   CL 105 05/06/2012 1333   CO2 28 12/21/2013 1150   CO2 23 09/03/2013 2145   GLUCOSE 95 12/21/2013 1150   GLUCOSE 121* 09/03/2013 2145   GLUCOSE 124* 05/06/2012 1333   BUN 12.1 12/21/2013 1150   BUN 8 09/03/2013 2145   CREATININE 0.9 12/21/2013 1150   CREATININE 0.64 09/03/2013 2145   CALCIUM 9.2 12/21/2013 1150   CALCIUM 9.0 09/03/2013 2145   PROT 6.7 12/21/2013 1150   PROT 6.8 09/03/2013 2145   ALBUMIN 3.5 12/21/2013 1150   ALBUMIN 3.3* 09/03/2013 2145   AST 52* 12/21/2013 1150   AST 55* 09/03/2013 2145   ALT 97* 12/21/2013 1150   ALT 85* 09/03/2013 2145   ALKPHOS 81 12/21/2013 1150   ALKPHOS 114 09/03/2013 2145   BILITOT 0.38 12/21/2013 1150   BILITOT 0.3 09/03/2013 2145   GFRNONAA >90 09/03/2013 2145   GFRAA >90 09/03/2013 2145    No results found for this basename: SPEP,  UPEP,   kappa and lambda light chains    Lab Results  Component Value Date   WBC 6.4 12/21/2013   NEUTROABS 4.7 12/21/2013   HGB 13.6 12/21/2013   HCT 41.5 12/21/2013   MCV 95.1 12/21/2013   PLT 339 12/21/2013  Chemistry      Component Value Date/Time   NA 140 12/21/2013 1150   NA 134* 09/03/2013 2145   K 3.6  12/21/2013 1150   K 3.7 09/03/2013 2145   CL 97 09/03/2013 2145   CL 105 05/06/2012 1333   CO2 28 12/21/2013 1150   CO2 23 09/03/2013 2145   BUN 12.1 12/21/2013 1150   BUN 8 09/03/2013 2145   CREATININE 0.9 12/21/2013 1150   CREATININE 0.64 09/03/2013 2145      Component Value Date/Time   CALCIUM 9.2 12/21/2013 1150   CALCIUM 9.0 09/03/2013 2145   ALKPHOS 81 12/21/2013 1150   ALKPHOS 114 09/03/2013 2145   AST 52* 12/21/2013 1150   AST 55* 09/03/2013 2145   ALT 97* 12/21/2013 1150   ALT 85* 09/03/2013 2145   BILITOT 0.38 12/21/2013 1150   BILITOT 0.3 09/03/2013 2145       Lab Results  Component Value Date   LABCA2 25 11/02/2007    No components found with this basename: LABCA125    No results found for this basename: INR,  in the last 168 hours  Urinalysis    Component Value Date/Time   COLORURINE YELLOW 09/03/2013 2109   [place] 09/03/2013 2109   LABSPEC 1.005 09/12/2013 1542   LABSPEC 1.016 09/03/2013 2109   PHURINE 6.0 09/03/2013 2109   GLUCOSEU Negative 09/12/2013 [place] 09/03/2013 2109   HGBUR NEGATIVE 09/03/2013 2109   BILIRUBINUR NEGATIVE 09/03/2013 2109   KETONESUR NEGATIVE 09/03/2013 2109   PROTEINUR NEGATIVE 09/03/2013 2109   UROBILINOGEN 0.2 09/12/2013 1542   UROBILINOGEN 0.2 09/03/2013 2109   NITRITE NEGATIVE 09/03/2013 2109   LEUKOCYTESUR NEGATIVE 09/03/2013 2109    STUDIES: Dg Chest 2 View  12/21/2013   CLINICAL DATA:  Followup pleural effusion. History of metastatic breast carcinoma.  EXAM: CHEST  2 VIEW  COMPARISON:  11/17/2013  FINDINGS: Moderate left effusion, mildly decreased in size from the prior study. No right pleural effusion. No pulmonary edema. Right base opacity lies above the pleural effusion most consistent with atelectasis. No convincing pneumonia.  Left chest tube curls near the left apex, unchanged. No pneumothorax.  Cardiac silhouette is normal in size and configuration. No mediastinal or hilar masses or evidence of adenopathy.  There are changes from right  breast surgery.  Bony thorax is demineralized. No osteoblastic or osteolytic lesions.  IMPRESSION: 1. No acute findings. 2. Moderate left pleural effusion, mildly decreased in size from the prior study. Mild associated left lung base atelectasis. 3. Stable left chest tube.  No pneumothorax.   Electronically Signed   By: Lajean Manes M.D.   On: 12/21/2013 14:18    ASSESSMENT: 53 y.o.  woman with stage IV breast cancer, history as follows  (1)  S/p Right lumpectomy and sentinel lymph node sampling 03/15/2004 for a pT1c pN0. Stage IA invasive ductal carcinoma, grade 2, estrogen receptor 95% positive, progesterone receptor 65% positive, HER-2 not amplified; additional surgery 04/25/2004 for seroma or clearance showed no residual tumor  (2) adjuvant chemotherapy with cyclophosphamide and doxorubicin every 21 days x4 completed 07/19/2004  (3) adjuvant radiation given under [name] [name] in [employer] completed July 2006  (4) the patient opted against adjuvant antiestrogen therapy  (5) genetics testing showed no BRCA mutations  (6) biopsy of a palpable right axillary mass 10/24/2009 showed invasive ductal carcinoma, grade 3, estrogen receptor 100% positive, progesterone receptor 2% positive (alert score 5) HER-2 negative; no evidence of systemic disease on PET scanning  (  7) completed 3 of 4 planned cycles of docetaxel and cyclophosphamide September 2011, fourth cycle omitted because of marked elevations in liver function tests  (8) an right axillary lymph node dissection 03/06/2010 showed 3/8 lymph nodes removed to be involved by tumor, with extracapsular extension.  (9) 45 Gy radiation to the right axillary and right supraclavicular nodal areas, with capecitabine sensitization, completed March 2012   (10) intolerant of letrozole and exemestane; on tamoxifen with interruptions September 2012 to March 2013, but then continuing on tamoxifen more continuously through March of 2015  (11)  biopsy of mediastinal adenopathy 06/02/2013 shows invasive ductal carcinoma (gross cystic disease fluid protein positive, TTS-1 negative), estrogen receptor 80% positive, progesterone receptor 2% positive, HER-2 not amplified  (12) letrozole started March 2015-- tolerated with significant side effects, discontinued at the end of May 2015  (13) PET scan 08/16/2013 shows extensive left pleural metastatic disease and a large left pleural effusion that shifts cardiac and mediastinal structures to the right; adenopathy (celiac trunk, periadrenal, periaortic); and a left medial clavicular lesion; Status post left thoracentesis 08/16/2013 positive for adenocarcinoma, estrogen receptor positive, progesterone receptor negative.  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 She is currently very stable clinically. She is getting her effusion drained twice a week with no apparent increase or decrease in the volumes. She tolerates the procedure well. She is able to exercise by walking and also by going to the gym once a week. She is hoping that someday she may be able to get rid of the effusion altogether and pull the Pleurx, which does bother her. She is willing to continue as now for now.  She tends to blame her letrozole  for symptoms that probably are not related to it and I reassured her that letrozole does not cause peripheral neuropathy or ankle swelling problems. She is willing to continue on this medication but continues to hope that at some point, perhaps with some "vaccine therapy", she could be cured, and would not need any kind of treatment.  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 This to some extent predicts for sensitivity to everolimus, and therefore switching to exemestane plus everolimus at the time of progression would be a logical step. Likely this also qualifies her for the study currently available at [facility] using capecitabine plus a Pi3KI inhibitor, so that would be another possible choice.  I think it will be useful to repeat a PET scan and we have scheduled this for late October. She will see me a few days after that test. If there has been evidence of disease progression we will consider changing treatments as discussed above. Otherwise we are continuing treatment with letrozole and drainage every Monday and Thursday.  [name] [name] has a good understanding of this plan. She agrees with it. She knows the goal of treatment  in her case is control. She will call with any problems that may develop before her next visit here.     Chauncey Cruel, MD   12/23/2013 9:23 AM

## 2013-12-23 NOTE — Telephone Encounter (Signed)
, °

## 2013-12-26 ENCOUNTER — Ambulatory Visit: Payer: 59 | Admitting: Physical Therapy

## 2013-12-26 DIAGNOSIS — IMO0001 Reserved for inherently not codable concepts without codable children: Secondary | ICD-10-CM | POA: Diagnosis not present

## 2013-12-28 ENCOUNTER — Encounter: Payer: Self-pay | Admitting: Physical Therapy

## 2013-12-30 ENCOUNTER — Ambulatory Visit: Payer: 59 | Attending: Oncology | Admitting: Physical Therapy

## 2013-12-30 DIAGNOSIS — C50911 Malignant neoplasm of unspecified site of right female breast: Secondary | ICD-10-CM | POA: Diagnosis present

## 2013-12-30 DIAGNOSIS — Z9889 Other specified postprocedural states: Secondary | ICD-10-CM | POA: Diagnosis not present

## 2013-12-30 DIAGNOSIS — I890 Lymphedema, not elsewhere classified: Secondary | ICD-10-CM | POA: Diagnosis not present

## 2013-12-30 DIAGNOSIS — Z923 Personal history of irradiation: Secondary | ICD-10-CM | POA: Insufficient documentation

## 2013-12-30 DIAGNOSIS — Z9221 Personal history of antineoplastic chemotherapy: Secondary | ICD-10-CM | POA: Insufficient documentation

## 2014-01-02 ENCOUNTER — Ambulatory Visit: Payer: 59 | Admitting: Physical Therapy

## 2014-01-02 DIAGNOSIS — I890 Lymphedema, not elsewhere classified: Secondary | ICD-10-CM | POA: Diagnosis not present

## 2014-01-04 ENCOUNTER — Ambulatory Visit: Payer: 59 | Admitting: Physical Therapy

## 2014-01-06 ENCOUNTER — Ambulatory Visit: Payer: 59 | Admitting: Physical Therapy

## 2014-01-09 ENCOUNTER — Ambulatory Visit: Payer: 59 | Admitting: Physical Therapy

## 2014-01-09 DIAGNOSIS — I890 Lymphedema, not elsewhere classified: Secondary | ICD-10-CM | POA: Diagnosis not present

## 2014-01-11 ENCOUNTER — Ambulatory Visit: Payer: 59 | Admitting: Physical Therapy

## 2014-01-11 DIAGNOSIS — I890 Lymphedema, not elsewhere classified: Secondary | ICD-10-CM | POA: Diagnosis not present

## 2014-01-12 ENCOUNTER — Encounter: Payer: Self-pay | Admitting: *Deleted

## 2014-01-13 ENCOUNTER — Ambulatory Visit: Payer: 59 | Admitting: Physical Therapy

## 2014-01-13 ENCOUNTER — Other Ambulatory Visit: Payer: Self-pay

## 2014-01-16 ENCOUNTER — Ambulatory Visit: Payer: 59 | Admitting: Physical Therapy

## 2014-01-16 DIAGNOSIS — I890 Lymphedema, not elsewhere classified: Secondary | ICD-10-CM | POA: Diagnosis not present

## 2014-01-18 ENCOUNTER — Ambulatory Visit: Payer: 59 | Admitting: Physical Therapy

## 2014-01-20 ENCOUNTER — Ambulatory Visit: Payer: 59 | Admitting: Physical Therapy

## 2014-01-20 DIAGNOSIS — I890 Lymphedema, not elsewhere classified: Secondary | ICD-10-CM | POA: Diagnosis not present

## 2014-01-23 ENCOUNTER — Ambulatory Visit: Payer: 59 | Admitting: Physical Therapy

## 2014-01-23 DIAGNOSIS — I890 Lymphedema, not elsewhere classified: Secondary | ICD-10-CM | POA: Diagnosis not present

## 2014-01-25 ENCOUNTER — Ambulatory Visit (HOSPITAL_COMMUNITY)
Admission: RE | Admit: 2014-01-25 | Discharge: 2014-01-25 | Disposition: A | Discharge status: Home / Self Care | Payer: 59 | Source: Ambulatory Visit | Attending: Oncology | Admitting: Oncology

## 2014-01-25 ENCOUNTER — Ambulatory Visit: Payer: 59

## 2014-01-25 ENCOUNTER — Other Ambulatory Visit (HOSPITAL_BASED_OUTPATIENT_CLINIC_OR_DEPARTMENT_OTHER): Payer: 59

## 2014-01-25 ENCOUNTER — Encounter: Payer: Self-pay | Admitting: *Deleted

## 2014-01-25 ENCOUNTER — Ambulatory Visit: Payer: 59 | Admitting: Physical Therapy

## 2014-01-25 ENCOUNTER — Other Ambulatory Visit: Payer: Self-pay | Admitting: *Deleted

## 2014-01-25 DIAGNOSIS — C50211 Malignant neoplasm of upper-inner quadrant of right female breast: Secondary | ICD-10-CM | POA: Diagnosis not present

## 2014-01-25 DIAGNOSIS — R945 Abnormal results of liver function studies: Secondary | ICD-10-CM

## 2014-01-25 DIAGNOSIS — R7989 Other specified abnormal findings of blood chemistry: Secondary | ICD-10-CM | POA: Insufficient documentation

## 2014-01-25 DIAGNOSIS — C50919 Malignant neoplasm of unspecified site of unspecified female breast: Secondary | ICD-10-CM | POA: Insufficient documentation

## 2014-01-25 DIAGNOSIS — R591 Generalized enlarged lymph nodes: Secondary | ICD-10-CM | POA: Insufficient documentation

## 2014-01-25 DIAGNOSIS — J90 Pleural effusion, not elsewhere classified: Secondary | ICD-10-CM

## 2014-01-25 DIAGNOSIS — C799 Secondary malignant neoplasm of unspecified site: Secondary | ICD-10-CM | POA: Diagnosis not present

## 2014-01-25 DIAGNOSIS — J910 Malignant pleural effusion: Secondary | ICD-10-CM | POA: Diagnosis not present

## 2014-01-25 DIAGNOSIS — C50419 Malignant neoplasm of upper-outer quadrant of unspecified female breast: Secondary | ICD-10-CM

## 2014-01-25 LAB — CBC WITH DIFFERENTIAL/PLATELET
BASO%: 0.4 % (ref 0.0–2.0)
Basophils Absolute: 0 10*3/uL (ref 0.0–0.1)
EOS%: 2.2 % (ref 0.0–7.0)
Eosinophils Absolute: 0.1 10*3/uL (ref 0.0–0.5)
HEMATOCRIT: 41 % (ref 34.8–46.6)
HEMOGLOBIN: 14.1 g/dL (ref 11.6–15.9)
LYMPH%: 20.5 % (ref 14.0–49.7)
MCH: 32.2 pg (ref 25.1–34.0)
MCHC: 34.4 g/dL (ref 31.5–36.0)
MCV: 93.6 fL (ref 79.5–101.0)
MONO#: 0.4 10*3/uL (ref 0.1–0.9)
MONO%: 6.8 % (ref 0.0–14.0)
NEUT%: 70.1 % (ref 38.4–76.8)
NEUTROS ABS: 3.9 10*3/uL (ref 1.5–6.5)
Platelets: 309 10*3/uL (ref 145–400)
RBC: 4.38 10*6/uL (ref 3.70–5.45)
RDW: 13.1 % (ref 11.2–14.5)
WBC: 5.6 10*3/uL (ref 3.9–10.3)
lymph#: 1.1 10*3/uL (ref 0.9–3.3)

## 2014-01-25 LAB — COMPREHENSIVE METABOLIC PANEL (CC13)
ALT: 115 U/L — ABNORMAL HIGH (ref 0–55)
ANION GAP: 7 meq/L (ref 3–11)
AST: 62 U/L — ABNORMAL HIGH (ref 5–34)
Albumin: 3.8 g/dL (ref 3.5–5.0)
Alkaline Phosphatase: 79 U/L (ref 40–150)
BILIRUBIN TOTAL: 0.54 mg/dL (ref 0.20–1.20)
BUN: 12 mg/dL (ref 7.0–26.0)
CO2: 25 meq/L (ref 22–29)
CREATININE: 0.8 mg/dL (ref 0.6–1.1)
Calcium: 9.6 mg/dL (ref 8.4–10.4)
Chloride: 106 mEq/L (ref 98–109)
Glucose: 98 mg/dl (ref 70–140)
Potassium: 4 mEq/L (ref 3.5–5.1)
SODIUM: 138 meq/L (ref 136–145)
TOTAL PROTEIN: 6.9 g/dL (ref 6.4–8.3)

## 2014-01-25 LAB — GLUCOSE, CAPILLARY: GLUCOSE-CAPILLARY: 102 mg/dL — AB (ref 70–99)

## 2014-01-25 MED ORDER — FLUDEOXYGLUCOSE F - 18 (FDG) INJECTION
8.5000 | Freq: Once | INTRAVENOUS | Status: AC | PRN
  Administered 2014-01-25: 8.5 via INTRAVENOUS

## 2014-01-25 NOTE — Progress Notes (Signed)
PT was accessed this AM for PET scan via Peripheral. Pt was sent to radiology and informed to come back for lab draw when finished. Pt returned and IV was accessed for blood draw with only 1 cc of blood return and pt requested IV to be taken out at this time. Inserted approx 0.5 cc of saline, pt became aggitated and stated using profanity to get this IV out of her arm. While removing tape from pt's arm pt continued using profanity and aggressive behavior. IV was removed and pt was sent to phlebotomist for lab draw, pt requested only [name] [name] to draw labs or she would not get them done today. [name] was able to draw labs peripherally. Pt calmed down and was okay for discharge.

## 2014-01-27 ENCOUNTER — Ambulatory Visit: Payer: 59 | Admitting: Physical Therapy

## 2014-01-27 DIAGNOSIS — I890 Lymphedema, not elsewhere classified: Secondary | ICD-10-CM | POA: Diagnosis not present

## 2014-01-30 ENCOUNTER — Ambulatory Visit: Payer: 59 | Attending: Oncology | Admitting: Physical Therapy

## 2014-01-30 DIAGNOSIS — I890 Lymphedema, not elsewhere classified: Secondary | ICD-10-CM

## 2014-01-30 NOTE — Therapy (Signed)
Physical Therapy Treatment  Patient Details  Name: [name] [name] MRN: [id number] Date of Birth: 07-Apr-1960  Encounter Date: 01/30/2014      PT End of Session - 01/30/14 1421    Visit Number [id number]   Number of Visits 133   Date for PT Re-Evaluation 02/06/14   PT Start Time 0815   PT Stop Time 6789   PT Time Calculation (min) 42 min   Activity Tolerance Patient tolerated treatment well      Past Medical History  Diagnosis Date  . Seizures 2010    Isolated incident.  . Breast cancer dx'd 2005/2011  . PONV (postoperative nausea and vomiting)   . Peripheral vascular disease 02/2010    blood clot related to porta cath    Past Surgical History  Procedure Laterality Date  . Breast lumpectomy  2005  . Axillary lymph node dissection  Dec. 2011  . Portacath placement  12/11  . Removal portacath    . Mediastinotomy chamberlain mcneil Left 06/02/2013    Procedure: MEDIASTINOTOMY CHAMBERLAIN MCNEIL;  Surgeon: [name], MD;  Location: [place];  Service: Thoracic;  Laterality: Left;  LEFT ANTERIOR MEDIASTINOTOMY     There were no vitals taken for this visit.  Visit Diagnosis:  Lymphedema        [facility] PT Assessment - 01/30/14 1400    Medical Diagnosis lymphedema   Onset Date 02/29/04   Precautions None;Other (comment)  cancer precautions   Has the patient fallen in the past 6 months No   Has the patient had a decrease in activity level because of a fear of falling?  No   Is the patient reluctant to leave their home because of a fear of falling?  No   Family/patient expects to be discharged to: Private residence   Living Arrangements Spouse/significant other   Available Help at Discharge Family   Type of [place] with basic ADLs   Posture/Postural Control No significant limitations                  Plan - 01/30/14 1429    Clinical Impression Statement Pt. continues to benefit from manual lymph drainage and  soft tissue work to manage swelling and pain.   Pt will benefit from skilled therapeutic intervention in order to improve on the following deficits Increased edema   Rehab Potential Good   Clinical Impairments Affecting Rehab Potential active cancer   PT Frequency Min 2X/week        Problem List Patient Active Problem List   Diagnosis Date Noted  . Abnormal LFTs (liver function tests) 09/12/2013  . Pleural effusion 08/18/2013  . 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 Breast cancer metastasized to multiple sites 08/18/2013            LYMPHEDEMA/ONCOLOGY QUESTIONNAIRE - 01/30/14 1400    Cancer Type right breast   Are you Having Heaviness or Tightness Yes   Are you having Pain Yes   Are you having pitting edema No   Is there Decreased scar mobility Yes      Treatment done today:  In left sidelying, manual lymph drainage at posterior interaxillary anaxtomosis and right axillo-inguinal anastomosis.   Then in supine, soft tissue mobilization at right axillary scar. Manual lymph drainage in supine as follows: short neck, left axillary nodes, right inguinal nodes, anterior inter-axillary anastamoses, right axillo-inguinal anastamoses, area between and around two lateral breast incisions, directing toward  pathways. Then in right sidelying, manual lymph drainage from left scapula toward inguinal nodes, and then soft tissue work at left upper back.          [facility] Goals - 01/30/14 1431    Title Reports left upper back and left side pain is controlled at 4/10 or less with therapy at the current frequency.   Time 6   Period Weeks   Status On-going   Title Pt. will report swelling is adequately managed to enable ADL function at a consistent level.   Time 6   Period Weeks   Status On-going          [name],[name] 01/30/2014, 5:02 PM

## 2014-01-31 ENCOUNTER — Ambulatory Visit (HOSPITAL_BASED_OUTPATIENT_CLINIC_OR_DEPARTMENT_OTHER): Payer: 59 | Admitting: Oncology

## 2014-01-31 DIAGNOSIS — C50412 Malignant neoplasm of upper-outer quadrant of left female breast: Secondary | ICD-10-CM

## 2014-01-31 DIAGNOSIS — K760 Fatty (change of) liver, not elsewhere classified: Secondary | ICD-10-CM

## 2014-01-31 DIAGNOSIS — C773 Secondary and unspecified malignant neoplasm of axilla and upper limb lymph nodes: Secondary | ICD-10-CM

## 2014-01-31 DIAGNOSIS — J90 Pleural effusion, not elsewhere classified: Secondary | ICD-10-CM

## 2014-01-31 DIAGNOSIS — C50211 Malignant neoplasm of upper-inner quadrant of right female breast: Secondary | ICD-10-CM

## 2014-01-31 DIAGNOSIS — Z86718 Personal history of other venous thrombosis and embolism: Secondary | ICD-10-CM

## 2014-01-31 DIAGNOSIS — R7989 Other specified abnormal findings of blood chemistry: Secondary | ICD-10-CM

## 2014-01-31 DIAGNOSIS — C50911 Malignant neoplasm of unspecified site of right female breast: Secondary | ICD-10-CM

## 2014-01-31 DIAGNOSIS — C782 Secondary malignant neoplasm of pleura: Secondary | ICD-10-CM

## 2014-01-31 DIAGNOSIS — M8580 Other specified disorders of bone density and structure, unspecified site: Secondary | ICD-10-CM

## 2014-01-31 DIAGNOSIS — R945 Abnormal results of liver function studies: Secondary | ICD-10-CM

## 2014-01-31 DIAGNOSIS — I890 Lymphedema, not elsewhere classified: Secondary | ICD-10-CM

## 2014-01-31 NOTE — Progress Notes (Signed)
[name] [name]  [phone] Fax:([phone]) [phone]     ID: [name] [name] OB: 1960-12-17  MR#: [id number]  YPP#:[id number]  PCP: Pcp Not In System GYN:  [name] SU:  OTHER MD: [name], [name], [name]  CHIEF COMPLAINT: Stage IV breast cancer CURRENT TREATMENT: Letrozole  BREAST CANCER HISTORY: From doctor [name]'s intake note 03/20/2004:  "The patient is a very pleasant 53 year old female, without significant past medical history.  Her family history is significant for a sister who at age 57 was diagnosed with invasive ductal carcinoma.  She is a breast cancer survivor at age 77 now.  The patient states that she has never really had a screening mammogram until October 2005, when she felt that it was time for her to start having mammograms done on a yearly basis.  Therefore, on 01/26/04, she underwent a screening mammogram and an abnormality was detected in the upper outer right breast.  She, therefore, underwent spot compression views of both the right and the left breast.  The left breast revealed a well-defined mass in the upper outer left quadrant, present at the 2 o'clock position, measuring 1.8 cm, 6 cm from the nipple.  This, by ultrasound, was felt to be a simple cyst measuring 1.8 cm.  On the right breast, a spiculated mass was noted in the upper outer right quadrant.  The ultrasound revealed a shadowing irregular solid mass at the 10:30 position, 9 cm from the nipple, measuring 1.2 cm in greatest dimension, correlating with the spiculated mass seen on the mammogram.  The right axilla was negative ultrasonically.  Because of this, the patient underwent a needle biopsy of the right breast and the biopsy was positive invasive mammary carcinoma that showed features consistent with a high-grade invasive ductal carcinoma associated with desmoplastic stroma.  No in situ component was seen and no definite lymphovascular  invasion was identified.  On the core biopsy, the tumor measured about 0.8 cm.  Because of this, she was seen by Dr. [name] and the patient was taken to the [facility] on March 15, 2004.  She underwent a right breast lumpectomy with sentinel node biopsy.  The final pathology revealed an invasive ductal carcinoma, measuring 1.7 cm, grade 2 of 3.  Margins were free of tumor.  Atypical lobular hyperplasia was noted.  One sentinel node was removed which was negative for metastatic disease.  The tumor was staged at T1c, N0 MX.  It was estrogen receptor positive, progesterone receptor positive.  HER-2/neu was 2+.  FISH was negative.  All margins were free of tumor.  She is now seen in Medical Oncology for further evaluation and management of this newly diagnosed T1c, node negative, stage I, invasive ductal carcinoma of the right breast."  Her subsequent history is as detailed below  INTERVAL HISTORY: [name] [name] returns today for followup of her stage IV breast cancer accompanied by her husband [name] [name]. The interval history is generally stable. She continues on letrozole. She generally is tolerating that well and her quality of life is "pretty good". She blames the letrozole for insomnia, cramping abdominal pains and some bowel habit changes. Hot flashes and vaginal dryness are not major issues. She is exercising regularly with a trainer, and she is also getting lymphedema treatments regularly. She brought me the most recent data from the Pleuryx, going back to August, and it is very stable. She is basically draining about 300 mL twice a week, the highest weekly amount being  drained was 775 and the lowest 475. She has had no complications from these procedures and particularly no significant pain, fever, erythema, or swelling.  REVIEW OF SYSTEMS: A detailed review of systems today was otherwise stable except as noted.  PAST MEDICAL HISTORY: Past Medical History  Diagnosis Date  . Seizures 2010    Isolated  incident.  . Breast cancer dx'd 2005/2011  . PONV (postoperative nausea and vomiting)   . Peripheral vascular disease 02/2010    blood clot related to porta cath    PAST SURGICAL HISTORY: Past Surgical History  Procedure Laterality Date  . Breast lumpectomy  2005  . Axillary lymph node dissection  Dec. 2011  . Portacath placement  12/11  . Removal portacath    . Mediastinotomy chamberlain mcneil Left 06/02/2013    Procedure: MEDIASTINOTOMY CHAMBERLAIN MCNEIL;  Surgeon: [name], MD;  Location: [facility];  Service: Thoracic;  Laterality: Left;  LEFT ANTERIOR MEDIASTINOTOMY     FAMILY HISTORY No family history on file. The patient's father is living, 40 years old as of may 2015. He lives in [place]. The patient's mother died from complications of COPD at the age of 50. These has 2 brothers, one sister. Her sister developed breast cancer at the age of 15. She is doing well. The patient herself underwent genetic testing [facility] in 2011 and was found to be BRCA negative  GYNECOLOGIC HISTORY:  Menarche age 24, she is GX P0. She stopped having periods with her initial chemotherapy in 2006.  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 Her husband [name] [name] is a Medical illustrator in [place]. He has a child from a prior marriage. At home they have 2 rescue dogs, [place]o and [place]. The patient is religious but not a church attender    ADVANCED DIRECTIVES:    HEALTH MAINTENANCE: History  Substance Use Topics  . Smoking status: Never Smoker   . Smokeless tobacco: Never Used  . Alcohol Use: No     Colonoscopy:  PAP:  Bone density:  Lipid panel:  Allergies  Allergen Reactions  . Decadron [Dexamethasone] Other (See Comments)    Patient does not tolerate steroids.   . Enoxaparin Other (See Comments)    unknown  . Hydromorphone Hcl Nausea And Vomiting  . Morphine And Related   . Tegaderm Ag Mesh [Silver]      Current Outpatient Prescriptions  Medication Sig Dispense Refill  . ALPRAZolam (XANAX) 0.5 MG tablet Take 1 tablet (0.5 mg total) by mouth 2 (two) times daily as needed for anxiety. 30 tablet 0  . b complex vitamins capsule Take 1 capsule by mouth daily.    . Calcium-Magnesium-Vitamin D (CITRACAL CALCIUM+D PO) Take by mouth. 479m vitamin C, 5029mvitamin D3 2 PO DAILY    . folic acid (FOLVITE) 1 MG tablet Take 1 mg by mouth daily.    . Marland Kitchenbuprofen (ADVIL,MOTRIN) 200 MG tablet Take 200 mg by mouth every 4 (four) hours as needed.    . Marland Kitchenetrozole (FEMARA) 2.5 MG tablet Take 2.5 mg by mouth daily.    . Melatonin 1 MG TABS Take 3 mg by mouth at bedtime as needed (sleep).     . ondansetron (ZOFRAN) 8 MG tablet     . palbociclib (IBRANCE) 125 MG capsule Take 125 mg by mouth.    . Marland KitchenITAMIN D, CHOLECALCIFEROL, PO Take 2,000 Units by mouth daily.     . [  DISCONTINUED] metoCLOPramide (REGLAN) 10 MG tablet Take 1 tablet (10 mg total) by mouth 4 (four) times daily -  before meals and at bedtime. 60 tablet 3   No current facility-administered medications for this visit.    OBJECTIVE: Middle-aged white woman who appears stated age 70 Vitals:     Cannot calculate BMI with a height equal to zero.   Filed Vitals:   Patient refused weight and vitals again today    ECOG FS:1 - Symptomatic but completely ambulatory  Sclerae unicteric, pupils equal and reactive Oropharynx clear, Teeth in good repair No cervical or supraclavicular adenopathy Lungs no rales or rhonchi Heart regular rate and rhythm Abd soft, nontender, positive bowel sounds MSK no focal spinal tenderness, no upper extremity lymphedema; Pleurx catheter in the left flank intact Neuro: nonfocal, well oriented, appropriate affect Breasts: deferred    LAB RESULTS:  CMP     Component Value Date/Time   NA 138 01/25/2014 0811   NA 134* 09/03/2013 2145   K 4.0 01/25/2014 0811   K 3.7 09/03/2013 2145   CL 97 09/03/2013 2145   CL  105 05/06/2012 1333   CO2 25 01/25/2014 0811   CO2 23 09/03/2013 2145   GLUCOSE 98 01/25/2014 0811   GLUCOSE 121* 09/03/2013 2145   GLUCOSE 124* 05/06/2012 1333   BUN 12.0 01/25/2014 0811   BUN 8 09/03/2013 2145   CREATININE 0.8 01/25/2014 0811   CREATININE 0.64 09/03/2013 2145   CALCIUM 9.6 01/25/2014 0811   CALCIUM 9.0 09/03/2013 2145   PROT 6.9 01/25/2014 0811   PROT 6.8 09/03/2013 2145   ALBUMIN 3.8 01/25/2014 0811   ALBUMIN 3.3* 09/03/2013 2145   AST 62* 01/25/2014 0811   AST 55* 09/03/2013 2145   ALT 115* 01/25/2014 0811   ALT 85* 09/03/2013 2145   ALKPHOS 79 01/25/2014 0811   ALKPHOS 114 09/03/2013 2145   BILITOT 0.54 01/25/2014 0811   BILITOT 0.3 09/03/2013 2145   GFRNONAA >90 09/03/2013 2145   GFRAA >90 09/03/2013 2145    No results found for: SPEP  Lab Results  Component Value Date   WBC 5.6 01/25/2014   NEUTROABS 3.9 01/25/2014   HGB 14.1 01/25/2014   HCT 41.0 01/25/2014   MCV 93.6 01/25/2014   PLT 309 01/25/2014      Chemistry      Component Value Date/Time   NA 138 01/25/2014 0811   NA 134* 09/03/2013 2145   K 4.0 01/25/2014 0811   K 3.7 09/03/2013 2145   CL 97 09/03/2013 2145   CL 105 05/06/2012 1333   CO2 25 01/25/2014 0811   CO2 23 09/03/2013 2145   BUN 12.0 01/25/2014 0811   BUN 8 09/03/2013 2145   CREATININE 0.8 01/25/2014 0811   CREATININE 0.64 09/03/2013 2145      Component Value Date/Time   CALCIUM 9.6 01/25/2014 0811   CALCIUM 9.0 09/03/2013 2145   ALKPHOS 79 01/25/2014 0811   ALKPHOS 114 09/03/2013 2145   AST 62* 01/25/2014 0811   AST 55* 09/03/2013 2145   ALT 115* 01/25/2014 0811   ALT 85* 09/03/2013 2145   BILITOT 0.54 01/25/2014 0811   BILITOT 0.3 09/03/2013 2145       Lab Results  Component Value Date   LABCA2 25 11/02/2007    No components found for: [id number]  No results for input(s): INR in the last 168 hours.  Urinalysis    Component Value Date/Time   COLORURINE YELLOW 09/03/2013 2109   APPEARANCEUR  CLEAR 09/03/2013  2109   LABSPEC 1.005 09/12/2013 1542   LABSPEC 1.016 09/03/2013 2109   PHURINE 6.0 09/03/2013 2109   GLUCOSEU Negative 09/12/2013 [place] 09/03/2013 2109   HGBUR NEGATIVE 09/03/2013 2109   BILIRUBINUR NEGATIVE 09/03/2013 2109   KETONESUR NEGATIVE 09/03/2013 2109   PROTEINUR NEGATIVE 09/03/2013 2109   UROBILINOGEN 0.2 09/12/2013 1542   UROBILINOGEN 0.2 09/03/2013 2109   NITRITE NEGATIVE 09/03/2013 2109   LEUKOCYTESUR NEGATIVE 09/03/2013 2109    STUDIES: Nm Pet Image Restag (ps) Skull Base To Thigh  01/25/2014   CLINICAL DATA:  Subsequent treatment strategy for breast cancer. Restaging examination.  EXAM: NUCLEAR MEDICINE PET SKULL BASE TO THIGH  TECHNIQUE: 8.5 mCi F-18 FDG was injected intravenously. Full-ring PET imaging was performed from the skull base to thigh after the radiotracer. CT data was obtained and used for attenuation correction and anatomic localization.  FASTING BLOOD GLUCOSE:  Value: One hundred and to mg/dl  COMPARISON:  PET-CT 08/16/2013.  FINDINGS: NECK  No hypermetabolic lymph nodes in the neck.  CHEST  Compared to the prior examination there is increasing hyper metabolism in the anterior left chest wall, involving the medial aspect of the upper left breast, as well as the underlying pectoralis major musculature where there are multiple small soft tissue attenuation nodules (measuring up to 11 mm in diameter on image 59 of series 4) and areas of hypermetabolism (SUVmax = 2.6-4.9). Lymphadenopathy is again noted along the distal left internal mammary nodal chain measuring up to 1.5 cm in short axis (SUVmax = 5.9). Extensive hypermetabolism in the anterior left chest wall, likely related to additional left internal mammary lymphadenopathy, slightly bulkier than the prior examination, measuring up to 1.7 cm in short axis (SUVmax = 8.2) and in the medial aspect of the left hemithorax where there is a large prevascular nodal mass or pleural based  mass measuring at least 6.4 x 3.1 cm (image 68 of series 4) which is diffusely hypermetabolic (SUVmax = 8.2). 14 mm short axis sub carinal node is enlarged compared to the prior study and is hypermetabolic (SUVmax = 6.1). There continues to be extensive multifocal nodular and masslike pleural thickening throughout the left hemithorax, generally otherwise similar to slightly decreased compared to the prior examination, with multifocal hypermetabolism, compatible with residual widespread pleural metastases. The previously noted left pleural effusion is smaller, currently moderate in size, with a tunneled pleural drainage catheter in position extending into the left apex, with tip directed into the anterior aspect of the mid left hemithorax. There is extensive passive atelectasis in the left lung, predominantly in the left lower lobe. Right lung is well aerated. Heart size is normal.  ABDOMEN/PELVIS  Nodular hypermetabolic thickening of the anterior aspect of the left crus of the hemidiaphragm measuring 2.3 x 1.5 cm (SUVmax = 7.0)is similar to the prior examination. Adjacent to this there is some left retroperitoneal lymphadenopathy which appears less bulky than the prior examination, currently measuring up to 1.2 cm in short axis (SUVmax = 5.2). No abnormal hypermetabolic activity within the liver, pancreas, adrenal glands, or spleen. Heterogeneous low attenuation throughout the hepatic parenchyma, compatible with heterogeneous hepatic steatosis. No significant volume of ascites.  SKELETON  Small lytic hypermetabolic (SUVmax = 9.7)QBHALP in the medial head of the left clavicle is similar to the prior study.  IMPRESSION: 1. Overall today study appears to demonstrate slight progression of disease compared to the prior examination. Areas of progression include soft tissue nodularity and hypermetabolism in the medial aspect of the  upper left breast and left pectoralis major muscle, as well as lymphadenopathy in the left  internal mammary chain, and a bulky soft tissue mass in the medial aspect of the left hemithorax which may represent a focus of pleural-based metastasis or prevascular lymphadenopathy. There is also a enlargement and hypermetabolism in a subcarinal lymph node which is new compared to the prior study. Other previously noted pleural-based disease in the left hemithorax, left hemidiaphragm involvement along the anterior aspect of the left crus of the hemidiaphragm, and left retroperitoneal lymphadenopathy is very similar to slightly decreased compared to the prior examination. 2. Decreased size of what is now a moderate sized malignant left pleural effusion following tunneled pleural catheter placement.   Electronically Signed   By: Vinnie Langton M.D.   On: 01/25/2014 12:38    ASSESSMENT: 53 y.o. [place] woman with stage IV breast cancer, history as follows  (1)  S/p Right lumpectomy and sentinel lymph node sampling 03/15/2004 for a pT1c pN0. Stage IA invasive ductal carcinoma, grade 2, estrogen receptor 95% positive, progesterone receptor 65% positive, HER-2 not amplified; additional surgery 04/25/2004 for seroma or clearance showed no residual tumor  (2) adjuvant chemotherapy with cyclophosphamide and doxorubicin every 21 days x4 completed 07/19/2004  (3) adjuvant radiation given under Dr. Donella Stade in [place] completed July 2006  (4) the patient opted against adjuvant antiestrogen therapy  (5) genetics testing showed no BRCA mutations  (6) biopsy of a palpable right axillary mass 10/24/2009 showed invasive ductal carcinoma, grade 3, estrogen receptor 100% positive, progesterone receptor 2% positive (alert score 5) HER-2 negative; no evidence of systemic disease on PET scanning  (7) completed 3 of 4 planned cycles of docetaxel and cyclophosphamide September 2011, fourth cycle omitted because of marked elevations in liver function tests  (8) an right axillary lymph node dissection 03/06/2010  showed 3/8 lymph nodes removed to be involved by tumor, with extracapsular extension.  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, TTS-1 negative), estrogen receptor 80% positive, progesterone receptor 2% positive, HER-2 not amplified  (12) letrozole started March 2015-- tolerated with significant side effects, discontinued at the end of May 2015  (13) PET scan 08/16/2013 shows extensive left pleural metastatic disease and a large left pleural effusion that shifts cardiac and mediastinal structures to the right; adenopathy (celiac trunk, periadrenal, periaortic); and a left medial clavicular lesion; Status post left thoracentesis 08/16/2013 positive for adenocarcinoma, estrogen receptor positive, progesterone receptor negative.  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 She built a strong case I think that the current PET scan is being compared toa PET scan from May.that includes a. Her letrozole was discontinued and she tried eribulin. It is impossible to say whether the small amount of growth we are seeing occurred during that time or not. If he did then this PET scan basically has been stable since July. A second arguments that she brought up is that the SUVs are lower and out to me is significant.  However the most important argument to me to continue letrozole is the fact that [place] is looking terrific she has an excellent functional status appears she has some discomfort related to her Pleurx, but is breathing very well is exercising regularly is taking trips and has a good quality of life at this point.  For that reason my vote is to continue letrozole at this point. There are other options. She wanted to drop the letrozole dose to every other day because of concerns regarding the liver. The third possibility is to add Palbociclib to letrozole area did finally we can always switch to exemestane and everolimus.  Leaner was particularly interested in cutting back on the letrozole dose. We decided that we would do that if there was a clear trend in the liver function tests. We defined a trend as 3 consecutive rises each of 20%. She would be more comfortable if we checked the liver function tests more frequently and therefore we will do lab work every 3 weeks.  In summary we are continuing on letrozole. We are checking labwork every 3 weeks. If there is a rise on 3 consecutive locations of 20% at least each in any of the liver function tests we will drop the letrozole dose. Otherwise we will leave it alone. We are going to recheck a PET  scan at the very end of December and she will see me the first week in January. At that point we of course will reassess the plan but we will also consider the possibility of adding Palbociclib assuming we are staying on letrozole.  [name] brought [place] some ethyl informs that he would like completed by tomorrow and we will do the best we can to accommodate that.  These as a good understanding of the overall plan. She agrees with it. She knows the goal of treatment in her case is control. She will call with any problems that may develop before her next visit here.     Chauncey Cruel, MD   01/31/2014 7:39 PM

## 2014-02-01 ENCOUNTER — Ambulatory Visit: Payer: 59 | Admitting: Physical Therapy

## 2014-02-01 ENCOUNTER — Telehealth: Payer: Self-pay | Admitting: Oncology

## 2014-02-01 NOTE — Telephone Encounter (Signed)
S/w pt labs/ov per 11/03 POF and PET scan schedule, per pt req mailed updated sch.... Cherylann Banas

## 2014-02-01 NOTE — Addendum Note (Signed)
Addended by: Laureen Abrahams on: 02/01/2014 11:11 AM   Modules accepted: Orders, Medications

## 2014-02-03 ENCOUNTER — Ambulatory Visit: Payer: 59 | Admitting: Physical Therapy

## 2014-02-03 ENCOUNTER — Other Ambulatory Visit: Payer: Self-pay | Admitting: Emergency Medicine

## 2014-02-03 DIAGNOSIS — C50211 Malignant neoplasm of upper-inner quadrant of right female breast: Secondary | ICD-10-CM

## 2014-02-03 DIAGNOSIS — I890 Lymphedema, not elsewhere classified: Secondary | ICD-10-CM | POA: Diagnosis not present

## 2014-02-03 DIAGNOSIS — C50919 Malignant neoplasm of unspecified site of unspecified female breast: Secondary | ICD-10-CM

## 2014-02-03 NOTE — Therapy (Signed)
Physical Therapy Treatment  Patient Details  Name: [name] [name] MRN: [id number] Date of Birth: 1960-10-26  Encounter Date: 02/03/2014      PT End of Session - 02/03/14 1208    Visit Number [id number]   Number of Visits 133   Date for PT Re-Evaluation 02/06/14   PT Start Time 1105   PT Stop Time 1150   PT Time Calculation (min) 45 min      Past Medical History  Diagnosis Date  . Seizures 2010    Isolated incident.  . Breast cancer dx'd 2005/2011  . PONV (postoperative nausea and vomiting)   . Peripheral vascular disease 02/2010    blood clot related to porta cath    Past Surgical History  Procedure Laterality Date  . Breast lumpectomy  2005  . Axillary lymph node dissection  Dec. 2011  . Portacath placement  12/11  . Removal portacath    . Mediastinotomy chamberlain mcneil Left 06/02/2013    Procedure: MEDIASTINOTOMY CHAMBERLAIN MCNEIL;  Surgeon: [name], MD;  Location: [facility];  Service: Thoracic;  Laterality: Left;  LEFT ANTERIOR MEDIASTINOTOMY     There were no vitals taken for this visit.  Visit Diagnosis:  Lymphedema          [facility] Adult PT Treatment/Exercise - 02/03/14 1230    Manual Therapy   Edema Management Manual lymph drainage:  in left sidelying, posterior interaxillary anastomosis and right axillo-inguinal anastomosis.  In supine, left axilla and anterior interaxillary anastomosis, right groin and axillo-inguinal anastomosis, and area between scars, redirecting toward pathways.  In right sidelying, left upper back toward left groin.   Massage Soft tissue work at left upper back in right sidelying.   Myofascial Release At right axilla, soft tissue mobilization and myofascial release.                Plan - 02/03/14 1208    Clinical Impression Statement Control of swelling and induration seems better over the last few weeks; pt. has less enduration between two scars when starting sessions, and it continues to improve during the  session.   Rehab Potential Good   PT Frequency Min 2X/week   PT Duration 8 weeks   PT Plan Renew next.  Continue soft tissue work and manual lymph drainage.        Problem List Patient Active Problem List   Diagnosis Date Noted  . Abnormal LFTs (liver function tests) 09/12/2013  . Pleural effusion 08/18/2013  . 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 Breast cancer metastasized to multiple sites 08/18/2013                                            [name] 02/03/2014, 12:32 PM

## 2014-02-06 ENCOUNTER — Ambulatory Visit: Payer: 59 | Admitting: Physical Therapy

## 2014-02-06 DIAGNOSIS — I890 Lymphedema, not elsewhere classified: Secondary | ICD-10-CM

## 2014-02-06 NOTE — Therapy (Signed)
Physical Therapy Treatment  Patient Details  Name: [name] [name] MRN: [id number] Date of Birth: May 25, 1960  Encounter Date: 02/06/2014      PT End of Session - 02/06/14 1702    Number of Visits 15      Past Medical History  Diagnosis Date  . Seizures 2010    Isolated incident.  . Breast cancer dx'd 2005/2011  . PONV (postoperative nausea and vomiting)   . Peripheral vascular disease 02/2010    blood clot related to porta cath    Past Surgical History  Procedure Laterality Date  . Breast lumpectomy  2005  . Axillary lymph node dissection  Dec. 2011  . Portacath placement  12/11  . Removal portacath    . Mediastinotomy chamberlain mcneil Left 06/02/2013    Procedure: MEDIASTINOTOMY CHAMBERLAIN MCNEIL;  Surgeon: [name], MD;  Location: [facility];  Service: Thoracic;  Laterality: Left;  LEFT ANTERIOR MEDIASTINOTOMY     There were no vitals taken for this visit.  Visit Diagnosis:  Lymphedema - Plan: PT plan of care cert/re-cert          [facility] Adult PT Treatment/Exercise - 02/06/14 1249    Manual Therapy   Edema Management manual lymph drainage in left sidelying:  right axillo-inguinal anastomosis and posterior interaxillary anastomosis; in supine, short neck, left axilla and anterior interaxillary anastomosis, right groin and axillo-inguinal anastomosis, and area between and around scars, redirecting toward pathways; in right sidelying, left periscapular area toward right groing.   Massage Soft tissue work for muscle relaxation and pain relief at left upper back in right sidelying.   Myofascial Release At right axilla, myofascial release and soft tissue mobilization.                Plan - 02/06/14 1257    Clinical Impression Statement Pt. with increased left flank pain today, and palpable muscle tightness in corresponding area.  Continues to benefit from manual lymph drainage and soft tissue work twice a week.   Pt will benefit from skilled  therapeutic intervention in order to improve on the following deficits Increased edema;Pain   Rehab Potential Good   PT Frequency 2x / week   PT Duration 8 weeks   PT Treatment/Interventions Manual techniques   PT Next Visit Plan Manual lymph drainage, soft tissue work, and myosfascial release.   PT Plan Continue manual lymph drainage, soft tissue work, and myofascial release for pain and edema control.        Problem List Patient Active Problem List   Diagnosis Date Noted  . Abnormal LFTs (liver function tests) 09/12/2013  . Pleural effusion 08/18/2013  . 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 Breast cancer metastasized to multiple sites 08/18/2013                                          Long Term Clinic Goals - 02/06/14 1705    CC Long Term Goal  #1   Status On-going   CC Long Term Goal  #2   Status On-going          [place] 02/06/2014, 5:10 PM

## 2014-02-08 ENCOUNTER — Ambulatory Visit: Payer: 59 | Admitting: Physical Therapy

## 2014-02-10 ENCOUNTER — Ambulatory Visit: Payer: 59 | Admitting: Physical Therapy

## 2014-02-10 DIAGNOSIS — I890 Lymphedema, not elsewhere classified: Secondary | ICD-10-CM | POA: Diagnosis not present

## 2014-02-10 NOTE — Therapy (Signed)
Physical Therapy Treatment  Patient Details  Name: [name] [name] MRN: [id number] Date of Birth: 02/05/1961  Encounter Date: 02/10/2014      PT End of Session - 02/10/14 1213    Visit Number 118   Number of Visits 133   PT Start Time 1107   PT Stop Time 1155   PT Time Calculation (min) 48 min      Past Medical History  Diagnosis Date  . Seizures 2010    Isolated incident.  . Breast cancer dx'd 2005/2011  . PONV (postoperative nausea and vomiting)   . Peripheral vascular disease 02/2010    blood clot related to porta cath    Past Surgical History  Procedure Laterality Date  . Breast lumpectomy  2005  . Axillary lymph node dissection  Dec. 2011  . Portacath placement  12/11  . Removal portacath    . Mediastinotomy chamberlain mcneil Left 06/02/2013    Procedure: MEDIASTINOTOMY CHAMBERLAIN MCNEIL;  Surgeon: [name], MD;  Location: [place];  Service: Thoracic;  Laterality: Left;  LEFT ANTERIOR MEDIASTINOTOMY     There were no vitals taken for this visit.  Visit Diagnosis:  Lymphedema      Subjective Assessment - 02/10/14 1208    Symptoms Having a lot of pain today after Pleur-X tube drainage yesterday.   Currently in Pain? Yes   Pain Score 8    Pain Location Flank   Pain Orientation Left   Pain Descriptors / Indicators Pressure   Aggravating Factors  Pleur-X tube drained yesterday.   Pain Relieving Factors Manual therapy; pain meds            [facility] Adult PT Treatment/Exercise - 02/10/14 0001    Manual Therapy   Edema Management Manual lymph drainage in right sidelying:  posterior interaxillary anastomosis and right axillo-inguinal anastomosis.  In supine, left axilla and anterior interaxillary anastomosis, right groin and axillo-inguinal anastomosis, and area around scars, directing toward pathways.  In right sidelying, from left scapula area toward left groin.   Massage Soft tissue work at left upper back in right sidelying--more  extensive today due to pain, including neck and paraspinals of upper back; also just lateral to patient's bandage around Pleur-X site.   Myofascial Release Gentle today at right axilla with soft tissue mobilization and light myofascial release.                Plan - 02/10/14 1214    Clinical Impression Statement Left flank pain greater today from Pleur-X tube draining--new equipment has made the draining harder to regulate and this has caused pain.   Pt will benefit from skilled therapeutic intervention in order to improve on the following deficits Increased edema;Pain   Rehab Potential Good   PT Next Visit Plan Continue manual therapies.        Problem List Patient Active Problem List   Diagnosis Date Noted  . Abnormal LFTs (liver function tests) 09/12/2013  . Pleural effusion 08/18/2013  . 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 Breast cancer metastasized to multiple sites 08/18/2013                                              [place] 02/10/2014, 12:16 PM  [name],[name], PT

## 2014-02-13 ENCOUNTER — Ambulatory Visit: Payer: 59 | Admitting: Physical Therapy

## 2014-02-13 DIAGNOSIS — I890 Lymphedema, not elsewhere classified: Secondary | ICD-10-CM

## 2014-02-14 NOTE — Therapy (Signed)
Physical Therapy Treatment  Patient Details  Name: [name] [name] MRN: [id number] Date of Birth: 1960-06-13  Encounter Date: 02/13/2014      PT End of Session - 02/13/14 1257    Visit Number 119   Number of Visits 133   Date for PT Re-Evaluation 04/03/14      Past Medical History  Diagnosis Date  . Seizures 2010    Isolated incident.  . Breast cancer dx'd 2005/2011  . PONV (postoperative nausea and vomiting)   . Peripheral vascular disease 02/2010    blood clot related to porta cath    Past Surgical History  Procedure Laterality Date  . Breast lumpectomy  2005  . Axillary lymph node dissection  Dec. 2011  . Portacath placement  12/11  . Removal portacath    . Mediastinotomy chamberlain mcneil Left 06/02/2013    Procedure: MEDIASTINOTOMY CHAMBERLAIN MCNEIL;  Surgeon: [name], MD;  Location: [place];  Service: Thoracic;  Laterality: Left;  LEFT ANTERIOR MEDIASTINOTOMY     There were no vitals taken for this visit.  Visit Diagnosis:  Lymphedema      Subjective Assessment - 02/13/14 1015    Symptoms Pain started to build on Friday afternoon; has been taking Motrin since then.   Currently in Pain? Yes   Pain Score 6    Pain Location Flank   Pain Orientation Left   Pain Descriptors / Indicators Constant   Aggravating Factors  Pleur-X tube drainage last Thursday   Pain Relieving Factors Motrin, xanax                    Plan - 02/14/14 0948    Clinical Impression Statement Has continued to have increased left flank pain since last Thursday--pain increased Friday afternoon.  Pt. reported some increased ease of pain with therapy treatments.   Pt will benefit from skilled therapeutic intervention in order to improve on the following deficits Increased edema;Pain   PT Next Visit Plan Manual lymph drainage and soft tissue work for swelling and pain.        Problem List Patient Active Problem List   Diagnosis Date Noted  . Abnormal  LFTs (liver function tests) 09/12/2013  . Pleural effusion 08/18/2013  . 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 Breast cancer metastasized to multiple sites 08/18/2013                                             [place], PT 02/14/2014, 9:52 AM

## 2014-02-15 ENCOUNTER — Encounter: Payer: Self-pay | Admitting: Physical Therapy

## 2014-02-15 ENCOUNTER — Ambulatory Visit: Payer: 59

## 2014-02-15 ENCOUNTER — Ambulatory Visit: Payer: Self-pay

## 2014-02-15 ENCOUNTER — Other Ambulatory Visit (HOSPITAL_BASED_OUTPATIENT_CLINIC_OR_DEPARTMENT_OTHER): Payer: 59

## 2014-02-15 DIAGNOSIS — C50211 Malignant neoplasm of upper-inner quadrant of right female breast: Secondary | ICD-10-CM

## 2014-02-15 DIAGNOSIS — C50412 Malignant neoplasm of upper-outer quadrant of left female breast: Secondary | ICD-10-CM

## 2014-02-15 DIAGNOSIS — J90 Pleural effusion, not elsewhere classified: Secondary | ICD-10-CM

## 2014-02-15 DIAGNOSIS — R945 Abnormal results of liver function studies: Secondary | ICD-10-CM

## 2014-02-15 DIAGNOSIS — C50919 Malignant neoplasm of unspecified site of unspecified female breast: Secondary | ICD-10-CM

## 2014-02-15 DIAGNOSIS — R7989 Other specified abnormal findings of blood chemistry: Secondary | ICD-10-CM

## 2014-02-15 LAB — CBC WITH DIFFERENTIAL/PLATELET
BASO%: 0.6 % (ref 0.0–2.0)
Basophils Absolute: 0 10*3/uL (ref 0.0–0.1)
EOS%: 2 % (ref 0.0–7.0)
Eosinophils Absolute: 0.1 10*3/uL (ref 0.0–0.5)
HCT: 40.8 % (ref 34.8–46.6)
HGB: 13.7 g/dL (ref 11.6–15.9)
LYMPH#: 1.1 10*3/uL (ref 0.9–3.3)
LYMPH%: 18.4 % (ref 14.0–49.7)
MCH: 32 pg (ref 25.1–34.0)
MCHC: 33.7 g/dL (ref 31.5–36.0)
MCV: 95.1 fL (ref 79.5–101.0)
MONO#: 0.4 10*3/uL (ref 0.1–0.9)
MONO%: 7.1 % (ref 0.0–14.0)
NEUT#: 4.5 10*3/uL (ref 1.5–6.5)
NEUT%: 71.9 % (ref 38.4–76.8)
Platelets: 342 10*3/uL (ref 145–400)
RBC: 4.29 10*6/uL (ref 3.70–5.45)
RDW: 13 % (ref 11.2–14.5)
WBC: 6.2 10*3/uL (ref 3.9–10.3)

## 2014-02-15 LAB — COMPREHENSIVE METABOLIC PANEL (CC13)
ALK PHOS: 91 U/L (ref 40–150)
ALT: 99 U/L — ABNORMAL HIGH (ref 0–55)
ANION GAP: 10 meq/L (ref 3–11)
AST: 54 U/L — ABNORMAL HIGH (ref 5–34)
Albumin: 3.8 g/dL (ref 3.5–5.0)
BILIRUBIN TOTAL: 0.39 mg/dL (ref 0.20–1.20)
BUN: 15.8 mg/dL (ref 7.0–26.0)
CO2: 27 meq/L (ref 22–29)
Calcium: 9.5 mg/dL (ref 8.4–10.4)
Chloride: 99 mEq/L (ref 98–109)
Creatinine: 1 mg/dL (ref 0.6–1.1)
GLUCOSE: 71 mg/dL (ref 70–140)
Potassium: 4.2 mEq/L (ref 3.5–5.1)
Sodium: 136 mEq/L (ref 136–145)
Total Protein: 6.9 g/dL (ref 6.4–8.3)

## 2014-02-15 NOTE — Progress Notes (Signed)
Patient waiting for PNA vaccine until Dec.

## 2014-02-17 ENCOUNTER — Ambulatory Visit: Payer: 59 | Admitting: Physical Therapy

## 2014-02-17 ENCOUNTER — Other Ambulatory Visit: Payer: Self-pay | Admitting: Oncology

## 2014-02-17 ENCOUNTER — Other Ambulatory Visit: Payer: Self-pay | Admitting: *Deleted

## 2014-02-17 DIAGNOSIS — I890 Lymphedema, not elsewhere classified: Secondary | ICD-10-CM | POA: Diagnosis not present

## 2014-02-17 NOTE — Therapy (Signed)
Physical Therapy Treatment  Patient Details  Name: [name] PROFETA MRN: [id number] Date of Birth: 1960-12-21  Encounter Date: 02/17/2014      PT End of Session - 02/17/14 1219    Visit Number 120   Number of Visits 133   Date for PT Re-Evaluation 04/03/14   PT Start Time 1024   PT Stop Time 1103   PT Time Calculation (min) 39 min      Past Medical History  Diagnosis Date  . Seizures 2010    Isolated incident.  . Breast cancer dx'd 2005/2011  . PONV (postoperative nausea and vomiting)   . Peripheral vascular disease 02/2010    blood clot related to porta cath    Past Surgical History  Procedure Laterality Date  . Breast lumpectomy  2005  . Axillary lymph node dissection  Dec. 2011  . Portacath placement  12/11  . Removal portacath    . Mediastinotomy chamberlain mcneil Left 06/02/2013    Procedure: MEDIASTINOTOMY CHAMBERLAIN MCNEIL;  Surgeon: [name], MD;  Location: [facility];  Service: Thoracic;  Laterality: Left;  LEFT ANTERIOR MEDIASTINOTOMY     There were no vitals taken for this visit.  Visit Diagnosis:  Lymphedema      Subjective Assessment - 02/17/14 1216    Symptoms Pain had gotten better, but more pain again last night after being drained yesterday.   Currently in Pain? Yes   Pain Score --  not rated, but better today than yesterday   Pain Location Flank   Pain Orientation Left   Aggravating Factors  The way the PleurX tube is drained.   Pain Relieving Factors Motrin, xanax            [facility] Adult PT Treatment/Exercise - 02/17/14 0001    Manual Therapy   Edema Management Manual lymph drainage as on 02/13/14.   Massage As on 02/13/14.   Myofascial Release As on 02/13/14.                Plan - 02/17/14 1220    Clinical Impression Statement Doing better, but not without the left flank pain today.   Pt will benefit from skilled therapeutic intervention in order to improve on the following deficits Increased edema;Pain         Problem List Patient Active Problem List   Diagnosis Date Noted  . Abnormal LFTs (liver function tests) 09/12/2013  . Pleural effusion 08/18/2013  . 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 Breast cancer metastasized to multiple sites 08/18/2013                             [place], PT 02/17/2014, 12:21 PM

## 2014-02-20 ENCOUNTER — Other Ambulatory Visit: Payer: Self-pay | Admitting: Oncology

## 2014-02-20 ENCOUNTER — Ambulatory Visit: Payer: 59 | Admitting: Physical Therapy

## 2014-02-20 DIAGNOSIS — I890 Lymphedema, not elsewhere classified: Secondary | ICD-10-CM

## 2014-02-20 NOTE — Therapy (Signed)
Physical Therapy Treatment  Patient Details  Name: [name] [name] MRN: [id number] Date of Birth: 1960-12-09  Encounter Date: 02/20/2014      PT End of Session - 02/20/14 1203    Visit Number 121   Number of Visits 133   Date for PT Re-Evaluation 04/03/14   PT Start Time 1105   PT Stop Time 1150   PT Time Calculation (min) 45 min      Past Medical History  Diagnosis Date  . Seizures 2010    Isolated incident.  . Breast cancer dx'd 2005/2011  . PONV (postoperative nausea and vomiting)   . Peripheral vascular disease 02/2010    blood clot related to porta cath    Past Surgical History  Procedure Laterality Date  . Breast lumpectomy  2005  . Axillary lymph node dissection  Dec. 2011  . Portacath placement  12/11  . Removal portacath    . Mediastinotomy chamberlain mcneil Left 06/02/2013    Procedure: MEDIASTINOTOMY CHAMBERLAIN MCNEIL;  Surgeon: [name], MD;  Location: [place];  Service: Thoracic;  Laterality: Left;  LEFT ANTERIOR MEDIASTINOTOMY     There were no vitals taken for this visit.  Visit Diagnosis:  Lymphedema      Subjective Assessment - 02/20/14 1108    Symptoms Feel a little full at right side under axilla.   Currently in Pain? Yes   Pain Score 4    Pain Location Flank   Pain Orientation Left            [facility] Adult PT Treatment/Exercise - 02/20/14 0001    Manual Therapy   Edema Management Manual lymph drainage in left sidelying, posterior interaxillary anastomosis and right axillo-inguinal anastomosis; in supine, short neck, left axilla and anterior interaxillary anastomosis, right groin and axillo-inguinal anastomosis, and area around right lateral breast incisions, directing toward pathways; in right sidelying, left scapula towards left inguinal nodes.   Massage Soft tissue work left upper back in right sidelying.   Myofascial Release Soft tissue mobilization and myofascial release at right axilla.                 Plan - 02/20/14 1204    Clinical Impression Statement Pain continues to decrease at left flank.   PT Next Visit Plan Assess next; continue.   PT Plan Continue manual lymph drainage, soft tissue work, and myofascial release for pain and edema control.        Problem List Patient Active Problem List   Diagnosis Date Noted  . Abnormal LFTs (liver function tests) 09/12/2013  . Pleural effusion 08/18/2013  . 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 Breast cancer metastasized to multiple sites 08/18/2013                                              [place], PT 02/20/2014, 12:05 PM

## 2014-02-22 ENCOUNTER — Ambulatory Visit: Payer: 59 | Admitting: Physical Therapy

## 2014-02-22 ENCOUNTER — Encounter: Payer: Self-pay | Admitting: Physical Therapy

## 2014-02-22 DIAGNOSIS — I890 Lymphedema, not elsewhere classified: Secondary | ICD-10-CM

## 2014-02-22 NOTE — Therapy (Signed)
Physical Therapy Treatment  Patient Details  Name: [name] [name] MRN: [id number] Date of Birth: 01-22-1961  Encounter Date: 02/22/2014      PT End of Session - 02/22/14 0841    Visit Number 122   Number of Visits 133   Date for PT Re-Evaluation 04/03/14      Past Medical History  Diagnosis Date  . Seizures 2010    Isolated incident.  . Breast cancer dx'd 2005/2011  . PONV (postoperative nausea and vomiting)   . Peripheral vascular disease 02/2010    blood clot related to porta cath    Past Surgical History  Procedure Laterality Date  . Breast lumpectomy  2005  . Axillary lymph node dissection  Dec. 2011  . Portacath placement  12/11  . Removal portacath    . Mediastinotomy chamberlain mcneil Left 06/02/2013    Procedure: MEDIASTINOTOMY CHAMBERLAIN MCNEIL;  Surgeon: [name], MD;  Location: [facility];  Service: Thoracic;  Laterality: Left;  LEFT ANTERIOR MEDIASTINOTOMY     There were no vitals taken for this visit.  Visit Diagnosis:  Lymphedema      Subjective Assessment - 02/22/14 0839    Symptoms Feeling better today; pain is better.            [facility] Adult PT Treatment/Exercise - 02/22/14 0001    Manual Therapy   Edema Management Manual lymph drainage as on 02/20/14.   Massage Soft tissue work as on 02/20/14.   Myofascial Release Soft tissue mobilization as on 02/20/14.                Plan - 02/22/14 0841    Clinical Impression Statement Feeling better today; left flank still with some fullness but not as firm as some days; area between right lateral breast scars less firm today also, and mainly at medial half.   Pt will benefit from skilled therapeutic intervention in order to improve on the following deficits Increased edema;Pain   Rehab Potential Good   PT Frequency 2x / week   PT Duration 6 weeks   PT Treatment/Interventions Manual techniques   PT Next Visit Plan Manual lymph drainage, myofascial release, soft tissue  work.        Problem List Patient Active Problem List   Diagnosis Date Noted  . Abnormal LFTs (liver function tests) 09/12/2013  . Pleural effusion 08/18/2013  . 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 Breast cancer metastasized to multiple sites 08/18/2013                                            Long Term Clinic Goals - 02/22/14 0843    CC Long Term Goal  #1   Status On-going   CC Long Term Goal  #2   Status On-going          [place], PT 02/22/2014, 8:44 AM

## 2014-02-22 NOTE — Therapy (Signed)
Physical Therapy Treatment  Patient Details  Name: [name] [name] MRN: [id number] Date of Birth: [birth year]  Encounter Date: 02/22/2014      PT End of Session - 02/22/14 1234    PT Start Time 0757   PT Stop Time 0838   PT Time Calculation (min) 41 min      Past Medical History  Diagnosis Date  . Seizures 2010    Isolated incident.  . Breast cancer dx'd 2005/2011  . PONV (postoperative nausea and vomiting)   . Peripheral vascular disease 02/2010    blood clot related to porta cath    Past Surgical History  Procedure Laterality Date  . Breast lumpectomy  2005  . Axillary lymph node dissection  Dec. 2011  . Portacath placement  12/11  . Removal portacath    . Mediastinotomy chamberlain mcneil Left 06/02/2013    Procedure: MEDIASTINOTOMY CHAMBERLAIN MCNEIL;  Surgeon: [name], MD;  Location: [place];  Service: Thoracic;  Laterality: Left;  LEFT ANTERIOR MEDIASTINOTOMY     There were no vitals taken for this visit.  Visit Diagnosis:  Lymphedema      Subjective Assessment - 02/22/14 0839    Symptoms Feeling better today; pain is better.            [facility] Adult PT Treatment/Exercise - 02/22/14 0001    Manual Therapy   Edema Management Manual lymph drainage as on 02/20/14.   Massage Soft tissue work as on 02/20/14.   Myofascial Release Soft tissue mobilization as on 02/20/14.                Plan - 02/22/14 0841    Clinical Impression Statement Feeling better today; left flank still with some fullness but not as firm as some days; area between right lateral breast scars less firm today also, and mainly at medial half.   Pt will benefit from skilled therapeutic intervention in order to improve on the following deficits Increased edema;Pain   Rehab Potential Good   PT Frequency 2x / week   PT Duration 6 weeks   PT Treatment/Interventions Manual techniques   PT Next Visit Plan Manual lymph drainage, myofascial release, soft tissue work.         Problem List Patient Active Problem List   Diagnosis Date Noted  . Abnormal LFTs (liver function tests) 09/12/2013  . Pleural effusion 08/18/2013  . 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 Breast cancer metastasized to multiple sites 08/18/2013                                            Long Term Clinic Goals - 02/22/14 0843    CC Long Term Goal  #1   Status On-going   CC Long Term Goal  #2   Status On-going         [place], PT  [place] 02/22/2014, 12:35 PM

## 2014-02-27 ENCOUNTER — Encounter: Payer: Self-pay | Admitting: Physical Therapy

## 2014-02-27 ENCOUNTER — Ambulatory Visit (HOSPITAL_COMMUNITY)
Admission: RE | Admit: 2014-02-27 | Discharge: 2014-02-27 | Disposition: A | Discharge status: Home / Self Care | Payer: 59 | Source: Ambulatory Visit | Attending: Oncology | Admitting: Oncology

## 2014-02-27 ENCOUNTER — Other Ambulatory Visit: Payer: Self-pay | Admitting: *Deleted

## 2014-02-27 ENCOUNTER — Ambulatory Visit: Payer: 59 | Admitting: Physical Therapy

## 2014-02-27 DIAGNOSIS — R0789 Other chest pain: Secondary | ICD-10-CM

## 2014-02-27 DIAGNOSIS — C50211 Malignant neoplasm of upper-inner quadrant of right female breast: Secondary | ICD-10-CM

## 2014-02-27 DIAGNOSIS — J9811 Atelectasis: Secondary | ICD-10-CM | POA: Insufficient documentation

## 2014-02-27 DIAGNOSIS — J90 Pleural effusion, not elsewhere classified: Secondary | ICD-10-CM

## 2014-02-27 DIAGNOSIS — Z9689 Presence of other specified functional implants: Secondary | ICD-10-CM | POA: Diagnosis not present

## 2014-02-27 DIAGNOSIS — J910 Malignant pleural effusion: Secondary | ICD-10-CM | POA: Insufficient documentation

## 2014-02-27 DIAGNOSIS — C50919 Malignant neoplasm of unspecified site of unspecified female breast: Secondary | ICD-10-CM | POA: Insufficient documentation

## 2014-02-27 DIAGNOSIS — I890 Lymphedema, not elsewhere classified: Secondary | ICD-10-CM | POA: Diagnosis not present

## 2014-02-27 NOTE — Therapy (Signed)
Physical Therapy Treatment  Patient Details  Name: [name] [name] MRN: [id number] Date of Birth: 19-Mar-1961  Encounter Date: 02/27/2014      PT End of Session - 02/27/14 1210    Visit Number 751   Number of Visits 133   Date for PT Re-Evaluation 04/03/14   PT Start Time 1110   PT Stop Time 1159   PT Time Calculation (min) 49 min      Past Medical History  Diagnosis Date  . Seizures 2010    Isolated incident.  . Breast cancer dx'd 2005/2011  . PONV (postoperative nausea and vomiting)   . Peripheral vascular disease 02/2010    blood clot related to porta cath    Past Surgical History  Procedure Laterality Date  . Breast lumpectomy  2005  . Axillary lymph node dissection  Dec. 2011  . Portacath placement  12/11  . Removal portacath    . Mediastinotomy chamberlain mcneil Left 06/02/2013    Procedure: MEDIASTINOTOMY CHAMBERLAIN MCNEIL;  Surgeon: [name], MD;  Location: [facility];  Service: Thoracic;  Laterality: Left;  LEFT ANTERIOR MEDIASTINOTOMY     There were no vitals taken for this visit.  Visit Diagnosis:  Lymphedema  Other chest pain      Subjective Assessment - 02/27/14 1205    Symptoms Having a new pain at left lower chest near drain tube; it feels hard there, the size of a quarter; I'm having an x-ray after this.   Pertinent History Pt. flew to the [place] area for the Thanksgiving weekend and doesn't know if new pain could be related to flying.   Currently in Pain? Yes   Pain Score --  not rated   Pain Location Chest   Pain Orientation Left   Pain Type Acute pain   Pain Onset Yesterday   Aggravating Factors  unsure            [facility] Adult PT Treatment/Exercise - 02/27/14 0001    Manual Therapy   Edema Management Manual lymph drainage in left sidelying:  posterior inter-axillary anastomosis and right axillo-inguinal anastomosis; in supine, left axilla and anterior interaxillary anastomosis, right groin and axillo-inguinal  anastomosis, and area between right lateral breast incisions; right upper arm.  In right sidelying, left scapular area toward left groin.   Massage Soft tissue work at left upper back in right sidelying.   Myofascial Release soft tissue mobilization, gentle myofascial release at right axilla.                Plan - 02/27/14 1210    Clinical Impression Statement Pt. cheerful and appeared to feel better at end of session.        Problem List Patient Active Problem List   Diagnosis Date Noted  . Abnormal LFTs (liver function tests) 09/12/2013  . Pleural effusion 08/18/2013  . 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 Breast cancer metastasized to multiple sites 08/18/2013                                             [place], PT  [place] 02/27/2014, 12:12 PM

## 2014-03-01 ENCOUNTER — Other Ambulatory Visit: Payer: Self-pay | Admitting: *Deleted

## 2014-03-01 ENCOUNTER — Encounter: Payer: Self-pay | Admitting: Physical Therapy

## 2014-03-01 DIAGNOSIS — C50919 Malignant neoplasm of unspecified site of unspecified female breast: Secondary | ICD-10-CM

## 2014-03-01 DIAGNOSIS — J90 Pleural effusion, not elsewhere classified: Secondary | ICD-10-CM

## 2014-03-01 DIAGNOSIS — R1900 Intra-abdominal and pelvic swelling, mass and lump, unspecified site: Secondary | ICD-10-CM

## 2014-03-02 ENCOUNTER — Encounter: Payer: Self-pay | Admitting: Oncology

## 2014-03-03 ENCOUNTER — Ambulatory Visit: Payer: 59 | Attending: Oncology | Admitting: Physical Therapy

## 2014-03-03 ENCOUNTER — Telehealth: Payer: Self-pay | Admitting: *Deleted

## 2014-03-03 DIAGNOSIS — R0789 Other chest pain: Secondary | ICD-10-CM

## 2014-03-03 DIAGNOSIS — I890 Lymphedema, not elsewhere classified: Secondary | ICD-10-CM | POA: Diagnosis not present

## 2014-03-03 NOTE — Telephone Encounter (Signed)
Sedra called to this RN to follow up on a " my chart " message she sent yesterday.  Per discussion Laqueena would like to hold on obtaining the U/S discussed previously " due to the area is much smaller and not as tender ".  Of note U/S was scheduled at [facility] and pt would prefer to come to [facility]- " and I do not need my whole abd viewed where I am not allowed to eat for 6 hours - I just wanted the area of discomfort right under my ribs "  Per discussion this RN discussed above - and at present U/S will be cancelled.  Janeane will call this RN if area worsens.

## 2014-03-03 NOTE — Therapy (Signed)
[place], [place], [address] Phone: [phone]   Fax:  [phone]  Physical Therapy Treatment  Patient Details  Name: [name] [name] MRN: [id number] Date of Birth: 04-Aug-1960  Encounter Date: 03/03/2014      PT End of Session - 03/03/14 1325    Visit Number 124   Number of Visits 133   Date for PT Re-Evaluation 04/03/14   PT Start Time 1020   PT Stop Time 1106   PT Time Calculation (min) 46 min      Past Medical History  Diagnosis Date  . Seizures 2010    Isolated incident.  . Breast cancer dx'd 2005/2011  . PONV (postoperative nausea and vomiting)   . Peripheral vascular disease 02/2010    blood clot related to porta cath    Past Surgical History  Procedure Laterality Date  . Breast lumpectomy  2005  . Axillary lymph node dissection  Dec. 2011  . Portacath placement  12/11  . Removal portacath    . Mediastinotomy chamberlain mcneil Left 06/02/2013    Procedure: MEDIASTINOTOMY CHAMBERLAIN MCNEIL;  Surgeon: [name], MD;  Location: [facility];  Service: Thoracic;  Laterality: Left;  LEFT ANTERIOR MEDIASTINOTOMY     There were no vitals taken for this visit.  Visit Diagnosis:  Lymphedema  Other chest pain      Subjective Assessment - 03/03/14 1319    Symptoms Pain is better after having stitches removed from PleurX drain.  The area on the right lateral breast between incisions is really hard today, though.   Pertinent History Pt. had stitches removed from PleurX drain on Monday afternoon.  Discomfort and hardness have been fading since then.   Currently in Pain? Yes   Pain Score --  not rated   Pain Location Chest   Pain Orientation Left   Pain Relieving Factors stitches removed from drain tube site            [facility] Adult PT Treatment/Exercise - 03/03/14 0001    Manual Therapy   Edema Management Manual lymph drainage in left sidelying:  posterior interaxillary  anastomosis right to left, and right axillo-inguinal anastomosis.  After myofascial work at right axilla, in supine:  left axilla and anterior interaxillary anastomosis, right groin and axillo-inguinal anastomosis, and area between right lateral breast incisions, directing toward pathways; also right upper arm.  After that, in right sidelying, from left scapula area toward left inguinal anastomosis.   Massage At end of session, in right sidelying, soft tissue work for pain relief and muscle relaxation at left upper back with Biotone.   Myofascial Release In supine after left sidelying manual lymph drainage, gentle soft tissue mobilization at right upper scar and axilla.                Plan - 03/03/14 1326    Clinical Impression Statement Area of induration between two right lateral breast scars appeared somewhat softer, although not totally relieved, after session today.   Pt will benefit from skilled therapeutic intervention in order to improve on the following deficits Increased edema;Pain   Rehab Potential Good   PT Frequency 2x / week   PT Duration Other (comment)  5 weeks   PT Treatment/Interventions Manual techniques;Manual lymph drainage;Scar mobilization   PT Next Visit Plan Manual lymph drainage, myofascial release, soft tissue work.   Consulted and Agree with Plan of Care Patient   PT Plan Continue manual lymph drainage, soft tissue work, and myofascial  release for pain and edema control.                               Problem List Patient Active Problem List   Diagnosis Date Noted  . Abnormal LFTs (liver function tests) 09/12/2013  . Pleural effusion 08/18/2013  . 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 Breast cancer metastasized to multiple sites 08/18/2013    [name],[name] 03/03/2014, 1:28 PM   [name],[name], PT

## 2014-03-06 ENCOUNTER — Ambulatory Visit: Payer: 59 | Admitting: Physical Therapy

## 2014-03-06 ENCOUNTER — Encounter: Payer: Self-pay | Admitting: Physical Therapy

## 2014-03-08 ENCOUNTER — Other Ambulatory Visit: Payer: Self-pay | Admitting: *Deleted

## 2014-03-08 ENCOUNTER — Ambulatory Visit: Payer: 59 | Admitting: Physical Therapy

## 2014-03-08 ENCOUNTER — Encounter: Payer: Self-pay | Admitting: Physical Therapy

## 2014-03-08 ENCOUNTER — Other Ambulatory Visit (HOSPITAL_BASED_OUTPATIENT_CLINIC_OR_DEPARTMENT_OTHER): Payer: 59

## 2014-03-08 ENCOUNTER — Ambulatory Visit (HOSPITAL_COMMUNITY): Payer: 59

## 2014-03-08 ENCOUNTER — Ambulatory Visit (HOSPITAL_BASED_OUTPATIENT_CLINIC_OR_DEPARTMENT_OTHER): Payer: 59

## 2014-03-08 DIAGNOSIS — J90 Pleural effusion, not elsewhere classified: Secondary | ICD-10-CM

## 2014-03-08 DIAGNOSIS — C50919 Malignant neoplasm of unspecified site of unspecified female breast: Secondary | ICD-10-CM

## 2014-03-08 DIAGNOSIS — C50412 Malignant neoplasm of upper-outer quadrant of left female breast: Secondary | ICD-10-CM

## 2014-03-08 DIAGNOSIS — I890 Lymphedema, not elsewhere classified: Secondary | ICD-10-CM

## 2014-03-08 DIAGNOSIS — R7989 Other specified abnormal findings of blood chemistry: Secondary | ICD-10-CM

## 2014-03-08 DIAGNOSIS — C50211 Malignant neoplasm of upper-inner quadrant of right female breast: Secondary | ICD-10-CM

## 2014-03-08 DIAGNOSIS — R945 Abnormal results of liver function studies: Secondary | ICD-10-CM

## 2014-03-08 DIAGNOSIS — Z23 Encounter for immunization: Secondary | ICD-10-CM

## 2014-03-08 DIAGNOSIS — R0789 Other chest pain: Secondary | ICD-10-CM

## 2014-03-08 LAB — COMPREHENSIVE METABOLIC PANEL (CC13)
ALT: 111 U/L — AB (ref 0–55)
ANION GAP: 10 meq/L (ref 3–11)
AST: 63 U/L — ABNORMAL HIGH (ref 5–34)
Albumin: 3.9 g/dL (ref 3.5–5.0)
Alkaline Phosphatase: 88 U/L (ref 40–150)
BILIRUBIN TOTAL: 0.41 mg/dL (ref 0.20–1.20)
BUN: 13 mg/dL (ref 7.0–26.0)
CALCIUM: 10.1 mg/dL (ref 8.4–10.4)
CHLORIDE: 103 meq/L (ref 98–109)
CO2: 27 meq/L (ref 22–29)
Creatinine: 0.9 mg/dL (ref 0.6–1.1)
EGFR: 75 mL/min/{1.73_m2} — AB (ref >90)
GLUCOSE: 88 mg/dL (ref 70–140)
Potassium: 4.1 mEq/L (ref 3.5–5.1)
Sodium: 141 mEq/L (ref 136–145)
TOTAL PROTEIN: 7.2 g/dL (ref 6.4–8.3)

## 2014-03-08 LAB — CBC WITH DIFFERENTIAL/PLATELET
BASO%: 1 % (ref 0.0–2.0)
Basophils Absolute: 0.1 10*3/uL (ref 0.0–0.1)
EOS ABS: 0.1 10*3/uL (ref 0.0–0.5)
EOS%: 2.4 % (ref 0.0–7.0)
HCT: 43.6 % (ref 34.8–46.6)
HGB: 14.5 g/dL (ref 11.6–15.9)
LYMPH#: 1.1 10*3/uL (ref 0.9–3.3)
LYMPH%: 19.6 % (ref 14.0–49.7)
MCH: 32.3 pg (ref 25.1–34.0)
MCHC: 33.4 g/dL (ref 31.5–36.0)
MCV: 96.7 fL (ref 79.5–101.0)
MONO#: 0.4 10*3/uL (ref 0.1–0.9)
MONO%: 6.4 % (ref 0.0–14.0)
NEUT%: 70.6 % (ref 38.4–76.8)
NEUTROS ABS: 4.1 10*3/uL (ref 1.5–6.5)
PLATELETS: 371 10*3/uL (ref 145–400)
RBC: 4.51 10*6/uL (ref 3.70–5.45)
RDW: 12.9 % (ref 11.2–14.5)
WBC: 5.8 10*3/uL (ref 3.9–10.3)

## 2014-03-08 MED ORDER — PNEUMOCOCCAL VAC POLYVALENT 25 MCG/0.5ML IJ INJ
0.5000 mL | INJECTION | INTRAMUSCULAR | Status: DC
  Administered 2014-03-08: 0.5 mL via INTRAMUSCULAR
  Filled 2014-03-08: qty 0.5

## 2014-03-08 NOTE — Therapy (Signed)
[place], [place], [address] Phone: [phone]   Fax:  [phone]  Physical Therapy Treatment  Patient Details  Name: [name] [name] MRN: [id number] Date of Birth: 11/29/1960  Encounter Date: 03/08/2014      PT End of Session - 03/08/14 1507    Visit Number 125   Number of Visits 133   Date for PT Re-Evaluation 04/03/14   PT Start Time 0800   PT Stop Time 0846   PT Time Calculation (min) 46 min   Activity Tolerance Patient tolerated treatment well      Past Medical History  Diagnosis Date  . Seizures 2010    Isolated incident.  . Breast cancer dx'd 2005/2011  . PONV (postoperative nausea and vomiting)   . Peripheral vascular disease 02/2010    blood clot related to porta cath    Past Surgical History  Procedure Laterality Date  . Breast lumpectomy  2005  . Axillary lymph node dissection  Dec. 2011  . Portacath placement  12/11  . Removal portacath    . Mediastinotomy chamberlain mcneil Left 06/02/2013    Procedure: MEDIASTINOTOMY CHAMBERLAIN MCNEIL;  Surgeon: [name], MD;  Location: [facility];  Service: Thoracic;  Laterality: Left;  LEFT ANTERIOR MEDIASTINOTOMY     There were no vitals taken for this visit.  Visit Diagnosis:  Lymphedema  Other chest pain      Subjective Assessment - 03/08/14 0805    Symptoms Worse yesterday at right axilla; had spasms.   Currently in Pain? Yes   Pain Score 6    Pain Location Axilla   Pain Orientation Right   Pain Descriptors / Indicators Cramping   Aggravating Factors  not having therapy for > 4 days (which was because of therapist absence)   Pain Relieving Factors therapy            [facility] Adult PT Treatment/Exercise - 03/08/14 0001    Manual Therapy   Edema Management Manual lymph drainage in left sidelying:  right axillo-inguinal anastomosis and posterior interaxillary anastomosis, right to left.  In supine, short neck, left  axilla and anterior interaxillary anastomosis, right groin and axillo-inguinal anastomosis, area around incision, directing toward pathways, and right upper arm.  In right sidelying, from left scapular area toward left groin.   Massage Soft tissue work at left upper back in right sidelying.   Myofascial Release In supine, soft tissue mobilization right superior axillary scar; myofascial release.                Plan - 03/08/14 1507    Clinical Impression Statement Pt. with induration at area between two lateral breast scars that softened during treatment; also with two areas of palpable tightness at left upper back, one posterior to axilla and one a bit distinferior to that.                               Problem List Patient Active Problem List   Diagnosis Date Noted  . Abnormal LFTs (liver function tests) 09/12/2013  . Pleural effusion 08/18/2013  . 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 Breast cancer metastasized to multiple sites 08/18/2013    [name],[name] 03/08/2014, 3:09 PM  [name],[name], PT

## 2014-03-08 NOTE — Patient Instructions (Signed)

## 2014-03-09 ENCOUNTER — Other Ambulatory Visit: Payer: Self-pay | Admitting: *Deleted

## 2014-03-10 ENCOUNTER — Ambulatory Visit: Payer: 59 | Admitting: Physical Therapy

## 2014-03-10 DIAGNOSIS — I890 Lymphedema, not elsewhere classified: Secondary | ICD-10-CM

## 2014-03-10 DIAGNOSIS — R0789 Other chest pain: Secondary | ICD-10-CM

## 2014-03-10 NOTE — Therapy (Signed)
[place], [place], [address] Phone: [phone]   Fax:  [phone]  Physical Therapy Treatment  Patient Details  Name: [name] [name] MRN: [id number] Date of Birth: 03/07/1961  Encounter Date: 03/10/2014      PT End of Session - 03/10/14 1215    Visit Number 126   Number of Visits 133   Date for PT Re-Evaluation 04/03/14   PT Start Time 1016   PT Stop Time 1103   PT Time Calculation (min) 47 min   Activity Tolerance Patient tolerated treatment well      Past Medical History  Diagnosis Date  . Seizures 2010    Isolated incident.  . Breast cancer dx'd 2005/2011  . PONV (postoperative nausea and vomiting)   . Peripheral vascular disease 02/2010    blood clot related to porta cath    Past Surgical History  Procedure Laterality Date  . Breast lumpectomy  2005  . Axillary lymph node dissection  Dec. 2011  . Portacath placement  12/11  . Removal portacath    . Mediastinotomy chamberlain mcneil Left 06/02/2013    Procedure: MEDIASTINOTOMY CHAMBERLAIN MCNEIL;  Surgeon: [name], MD;  Location: [facility];  Service: Thoracic;  Laterality: Left;  LEFT ANTERIOR MEDIASTINOTOMY     There were no vitals taken for this visit.  Visit Diagnosis:  Lymphedema  Other chest pain      Subjective Assessment - 03/10/14 1021    Symptoms Had a bad day yesterday.  Got  my bloodwork and pneumonia shot Wednesday.   Currently in Pain? Yes   Pain Score 4    Pain Location --  area between incisions   Pain Orientation Right   Pain Descriptors / Indicators Other (Comment)  "not terrible"            [facility] Adult PT Treatment/Exercise - 03/10/14 0001    Manual Therapy   Edema Management Manual lymph drainage as on 03/08/14.   Massage Soft tissue work as on 03/08/14.   Myofascial Release Myofascial release as on 03/08/14.                Plan - 03/10/14 1215    Clinical Impression Statement  Pt. continues to have swelling with induration that lessens and softens with treatment as noted with a change in how it feels with palpation and with her decreased discomfort.   Pt will benefit from skilled therapeutic intervention in order to improve on the following deficits Pain;Increased edema   Rehab Potential Good   Clinical Impairments Affecting Rehab Potential active cancer   PT Frequency 2x / week   PT Treatment/Interventions Manual techniques;Manual lymph drainage;Scar mobilization   PT Next Visit Plan Manual lymph drainage, myofascial release, soft tissue work.   PT Plan Continue manual lymph drainage, soft tissue work, and myofascial release for pain and edema control.                              [facility] Goals - 03/10/14 1217    CC Long Term Goal  #1   Status On-going   CC Long Term Goal  #2   Status On-going         Problem List Patient Active Problem List   Diagnosis Date Noted  . Abnormal LFTs (liver function tests) 09/12/2013  . Pleural effusion 08/18/2013  . 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  Breast cancer metastasized to multiple sites 08/18/2013    [name],[name] 03/10/2014, 12:18 PM   [name],[name], PT

## 2014-03-11 NOTE — Telephone Encounter (Signed)
none

## 2014-03-13 ENCOUNTER — Encounter: Payer: Self-pay | Admitting: Physical Therapy

## 2014-03-14 ENCOUNTER — Ambulatory Visit: Payer: 59 | Admitting: Physical Therapy

## 2014-03-14 DIAGNOSIS — R0789 Other chest pain: Secondary | ICD-10-CM

## 2014-03-14 DIAGNOSIS — I890 Lymphedema, not elsewhere classified: Secondary | ICD-10-CM

## 2014-03-14 NOTE — Therapy (Signed)
[place], [place], [address] Phone: [phone]   Fax:  [phone]  Physical Therapy Treatment  Patient Details  Name: [name] [name] MRN: [id number] Date of Birth: 1960/08/16  Encounter Date: 03/14/2014      PT End of Session - 03/14/14 1306    Visit Number 127   Number of Visits 133   Date for PT Re-Evaluation 04/03/14   PT Start Time 1100   PT Stop Time 1144   PT Time Calculation (min) 44 min      Past Medical History  Diagnosis Date  . Seizures 2010    Isolated incident.  . Breast cancer dx'd 2005/2011  . PONV (postoperative nausea and vomiting)   . Peripheral vascular disease 02/2010    blood clot related to porta cath    Past Surgical History  Procedure Laterality Date  . Breast lumpectomy  2005  . Axillary lymph node dissection  Dec. 2011  . Portacath placement  12/11  . Removal portacath    . Mediastinotomy chamberlain mcneil Left 06/02/2013    Procedure: MEDIASTINOTOMY CHAMBERLAIN MCNEIL;  Surgeon: [name], MD;  Location: [facility];  Service: Thoracic;  Laterality: Left;  LEFT ANTERIOR MEDIASTINOTOMY     There were no vitals taken for this visit.  Visit Diagnosis:  Lymphedema  Other chest pain      Subjective Assessment - 03/14/14 1101    Currently in Pain? Yes   Pain Score 4    Pain Location Axilla   Pain Orientation Right   Pain Descriptors / Indicators Other (Comment)  uncomfortable   Aggravating Factors  increased swelling   Pain Relieving Factors therapy            [facility] Adult PT Treatment/Exercise - 03/14/14 0001    Manual Therapy   Edema Management Manual lymph drainage:  In left sidelying, posterior interaxillary anastomosis and right axillo-inguinal anastomosis; in supine, short neck, left axilla and anterior interaxillary anastomosis, right groin and axillo-inguinal anastomosis, area between and around scars at right lateral breast, directing  toward pathways, and right upper arm.  Later in right sidelying, left scapular area toward left groin.   Massage at left upper back in right sidelying   Myofascial Release at right axilla including soft tissue mobilization and crosshands release.                Plan - 03/14/14 1306    Clinical Impression Statement Pt. points out a sore spot near inferior of two right lateral breast scars today, but therapist was unable to palpate any change or issue there.   Pt will benefit from skilled therapeutic intervention in order to improve on the following deficits Pain;Increased edema   Rehab Potential Good   Clinical Impairments Affecting Rehab Potential active cancer   PT Frequency 2x / week   PT Duration 3 weeks   PT Treatment/Interventions Manual techniques;Manual lymph drainage;Scar mobilization   PT Next Visit Plan Manual lymph drainage, myofascial release, soft tissue work.   Consulted and Agree with Plan of Care Patient   PT Plan Continue manual lymph drainage, soft tissue work, and myofascial release for pain and edema control.                              [facility] Goals - 03/14/14 1103    CC Long Term Goal  #1   Status (p) On-going  Is at this level  now, but with therapy 2x/wk   CC Long Term Goal  #2   Status (p) On-going  Is meeting this with therapy 2x/wk         Problem List Patient Active Problem List   Diagnosis Date Noted  . Abnormal LFTs (liver function tests) 09/12/2013  . Pleural effusion 08/18/2013  . 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 Breast cancer metastasized to multiple sites 08/18/2013    Lesleyanne Politte 03/14/2014, 1:09 PM  Britny Riel, PT

## 2014-03-15 ENCOUNTER — Encounter: Payer: Self-pay | Admitting: Physical Therapy

## 2014-03-17 ENCOUNTER — Ambulatory Visit: Payer: 59 | Admitting: Physical Therapy

## 2014-03-17 ENCOUNTER — Other Ambulatory Visit: Payer: Self-pay | Admitting: *Deleted

## 2014-03-17 DIAGNOSIS — R0789 Other chest pain: Secondary | ICD-10-CM

## 2014-03-17 DIAGNOSIS — I890 Lymphedema, not elsewhere classified: Secondary | ICD-10-CM

## 2014-03-17 NOTE — Therapy (Signed)
[place], [place], [address] Phone: [phone]   Fax:  [phone]  Physical Therapy Treatment  Patient Details  Name: [name] [name] MRN: [id number] Date of Birth: Jan 13, 1961  Encounter Date: 03/17/2014      PT End of Session - 03/17/14 1220    Visit Number 128   Number of Visits 133   Date for PT Re-Evaluation 04/03/14   PT Start Time 1018   PT Stop Time 1100   PT Time Calculation (min) 42 min   Activity Tolerance Patient tolerated treatment well      Past Medical History  Diagnosis Date  . Seizures 2010    Isolated incident.  . Breast cancer dx'd 2005/2011  . PONV (postoperative nausea and vomiting)   . Peripheral vascular disease 02/2010    blood clot related to porta cath    Past Surgical History  Procedure Laterality Date  . Breast lumpectomy  2005  . Axillary lymph node dissection  Dec. 2011  . Portacath placement  12/11  . Removal portacath    . Mediastinotomy chamberlain mcneil Left 06/02/2013    Procedure: MEDIASTINOTOMY CHAMBERLAIN MCNEIL;  Surgeon: [name], MD;  Location: [facility];  Service: Thoracic;  Laterality: Left;  LEFT ANTERIOR MEDIASTINOTOMY     There were no vitals taken for this visit.  Visit Diagnosis:  Lymphedema  Other chest pain      Subjective Assessment - 03/17/14 1213    Symptoms Had my massage yesterday, but it's still hard here (at area around incisions).   Currently in Pain? Other (Comment)  no complaints offered by pt.                    [facility] Adult PT Treatment/Exercise - 03/17/14 0001    Manual Therapy   Edema Management Manual lymph drainage in left sidelying at posterior interaxillary anastomosis right to left and at right axillo-inguinal anastomosis.  In supine, short neck, left axilla and anterior interaxillary anastomosis, right groin and axillo-inguinal anastomosis, and area around incisions.  In right  sidelying, from left upper back peri-scapular area toward left groin.   Massage In right sidelying, soft tissue work to left upper back.   Myofascial Release At right axilla with focus on superior of two scars.                            Plan - 03/17/14 1221    Clinical Impression Statement Pt with moderate induration today (better than some days) at right lateral breast area between scars, palpably softer after treatment.  Left upper back muscle less tight today, probably because patient had a massage yesterday.   Pt will benefit from skilled therapeutic intervention in order to improve on the following deficits Pain;Increased edema   Rehab Potential Good   PT Treatment/Interventions Manual techniques;Manual lymph drainage;Scar mobilization   PT Next Visit Plan Manual lymph drainage, myofascial release, soft tissue work.   Consulted and Agree with Plan of Care Patient   PT Plan Continue manual lymph drainage, soft tissue work, and myofascial release for pain and edema control.        Problem List Patient Active Problem List   Diagnosis Date Noted  . Abnormal LFTs (liver function tests) 09/12/2013  . Pleural effusion 08/18/2013  . 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 Breast cancer metastasized to multiple sites 08/18/2013    [facility]  03/17/2014, 12:23 PM  [place], [place], [address] Phone: [phone]   Fax:  [phone]   [name], [place]

## 2014-03-20 ENCOUNTER — Ambulatory Visit: Payer: 59 | Admitting: Physical Therapy

## 2014-03-20 DIAGNOSIS — R0789 Other chest pain: Secondary | ICD-10-CM

## 2014-03-20 DIAGNOSIS — I890 Lymphedema, not elsewhere classified: Secondary | ICD-10-CM | POA: Diagnosis not present

## 2014-03-20 NOTE — Therapy (Signed)
[place], [place], [address] Phone: [phone]   Fax:  [phone]  Physical Therapy Treatment  Patient Details  Name: [name] [name] MRN: [id number] Date of Birth: [birth year]  Encounter Date: 03/20/2014      PT End of Session - 03/20/14 1143    Visit Number 129   Number of Visits 133   Date for PT Re-Evaluation 04/03/14   PT Start Time 1030   PT Stop Time 1110   PT Time Calculation (min) 40 min      Past Medical History  Diagnosis Date  . Seizures 2010    Isolated incident.  . Breast cancer dx'd 2005/2011  . PONV (postoperative nausea and vomiting)   . Peripheral vascular disease 02/2010    blood clot related to porta cath    Past Surgical History  Procedure Laterality Date  . Breast lumpectomy  2005  . Axillary lymph node dissection  Dec. 2011  . Portacath placement  12/11  . Removal portacath    . Mediastinotomy chamberlain mcneil Left 06/02/2013    Procedure: MEDIASTINOTOMY CHAMBERLAIN MCNEIL;  Surgeon: [name], MD;  Location: [facility];  Service: Thoracic;  Laterality: Left;  LEFT ANTERIOR MEDIASTINOTOMY     There were no vitals taken for this visit.  Visit Diagnosis:  Lymphedema  Other chest pain      Subjective Assessment - 03/20/14 1139    Symptoms Having pain at left side; thinks it's from the PleurX tube rubbing from how the nurse bandaged it.   Currently in Pain? Yes   Pain Score --  not rated   Pain Location Rib cage   Pain Orientation Left                    [facility] Adult PT Treatment/Exercise - 03/20/14 0001    Manual Therapy   Edema Management Manual lymph drainage in left sidelying position for posterior interaxillary anastomosis right to left and right axillo-inguinal anastomosis.  In supine, short neck, left axilla and anterior interaxillary anastomosis, right groin and axillo-inguinal anastomosis, and right lateral breast  incision areas directing toward pathways.  In right sidelying, from left scapula area toward left groin.   Massage in right sidelying to left upper back.   Myofascial Release At right axillary scar, brief soft tissue mobilization and myofascial release.                        [facility] Goals - 03/14/14 1103    CC Long Term Goal  #1   Status (p) On-going  Is at this level now, but with therapy 2x/wk   CC Long Term Goal  #2   Status (p) On-going  Is meeting this with therapy 2x/wk            Plan - 03/20/14 1144    Clinical Impression Statement Treatment shorter today due to therapist running late to get session started.   Rehab Potential Good   PT Next Visit Plan Manual lymph drainage, myofascial release, soft tissue work.        Problem List Patient Active Problem List   Diagnosis Date Noted  . Abnormal LFTs (liver function tests) 09/12/2013  . Pleural effusion 08/18/2013  . 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 Breast cancer metastasized to multiple sites 08/18/2013    [name],[name] 03/20/2014, 11:45 AM  [place]  [place], [place], [address] Phone: [phone]   Fax:  [phone]  [name], [place]

## 2014-03-21 ENCOUNTER — Ambulatory Visit (HOSPITAL_BASED_OUTPATIENT_CLINIC_OR_DEPARTMENT_OTHER): Payer: 59 | Admitting: Nurse Practitioner

## 2014-03-21 ENCOUNTER — Encounter: Payer: Self-pay | Admitting: Nurse Practitioner

## 2014-03-21 ENCOUNTER — Other Ambulatory Visit: Payer: Self-pay | Admitting: Emergency Medicine

## 2014-03-21 ENCOUNTER — Telehealth: Payer: Self-pay | Admitting: Nurse Practitioner

## 2014-03-21 DIAGNOSIS — R0789 Other chest pain: Secondary | ICD-10-CM

## 2014-03-21 DIAGNOSIS — J90 Pleural effusion, not elsewhere classified: Secondary | ICD-10-CM

## 2014-03-21 DIAGNOSIS — C50211 Malignant neoplasm of upper-inner quadrant of right female breast: Secondary | ICD-10-CM

## 2014-03-21 NOTE — Progress Notes (Signed)
will   SYMPTOM MANAGEMENT CLINIC   HPI: [name] [name] 53 y.o. female diagnosed with breast cancer.  Currently undergoing letrozole therapy.  Continues to hold the initiation of Ibrance oral therapy.  Patient called the cancer Center today requesting urgent care visit.  She reports discovering a nodule directly underneath her left upper chest wall biopsy site.  She she states that this area is slightly tender; with pain occasionally radiating up her clavicle to the left side of her neck.  She denies any specific chest pain or chest pressure, shortness breath or pain with inspiration.  She also denies any recent fevers or chills.  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  She has had a Pleurx drain catheter and intact since May 2015.  She typically drains the catheter twice weekly.  She last drained the catheter just yesterday 03/20/2014.   HPI  ROS  Past Medical History  Diagnosis Date  . Seizures 2010    Isolated incident.  . Breast cancer dx'd 2005/2011  . PONV (postoperative nausea and vomiting)   . Peripheral vascular disease 02/2010    blood clot related to porta cath    Past Surgical History  Procedure Laterality Date  . Breast lumpectomy  2005  . Axillary lymph node dissection  Dec. 2011  . Portacath placement  12/11  . Removal portacath    . 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     is allergic to decadron; enoxaparin; hydromorphone hcl; morphine and related; and tegaderm ag mesh.    Medication List       This list is accurate as  of: 03/21/14  6:52 PM.  Always use your most recent med list.               ALPRAZolam 0.5 MG tablet  Commonly known as:  XANAX  Take 1 tablet (0.5 mg total) by mouth 2 (two) times daily as needed for anxiety.     b complex vitamins capsule  Take 1 capsule by mouth daily.     CITRACAL CALCIUM+D PO  Take by mouth. 44m vitamin C, 5013mvitamin D3 2 PO DAILY     folic acid 1 MG tablet  Commonly known as:  FOLVITE  Take 1 mg by mouth daily.     IBRANCE 125 MG capsule  Generic drug:  palbociclib  Take 125 mg by mouth.     ibuprofen 200 MG tablet  Commonly known as:  ADVIL,MOTRIN  Take 200 mg by mouth every 4 (four) hours as needed.     letrozole 2.5 MG tablet  Commonly known as:  FEMARA  TAKE 1 TABLET BY MOUTH EVERY DAY     Melatonin 1 MG Tabs  Take 3 mg by mouth at bedtime as needed (sleep).     VITAMIN D (CHOLECALCIFEROL) PO  Take 2,000 Units by mouth daily.         PHYSICAL EXAMINATION   Physical Exam  Constitutional: She is oriented to person, place, and time and well-developed, well-nourished, and in no distress.  HENT:  Head: Normocephalic and atraumatic.  Eyes: Conjunctivae and EOM are  normal. Pupils are equal, round, and reactive to light. Right eye exhibits no discharge. Left eye exhibits no discharge. No scleral icterus.  Neck: Normal range of motion. Neck supple. No JVD present. No tracheal deviation present.  Cardiovascular: Normal rate, regular rhythm, normal heart sounds and intact distal pulses.   Pulmonary/Chest: Effort normal and breath sounds normal. No respiratory distress. She has no wheezes. She has no rales. She exhibits no tenderness.  Left Pleurx catheter intact.  Left upper chest wall previous biopsy site with small, pea-excised firm nodule directly underneath.  This area is mildly tender with palpation.  Patient is also noted to have some left upper sternal edema radiating towards her clavicle.  This area has no erythema, warmth, or red  streaks.  Abdominal: Soft. Bowel sounds are normal. She exhibits no distension and no mass. There is no tenderness. There is no rebound and no guarding.  Musculoskeletal: Normal range of motion. She exhibits no edema or tenderness.  Lymphadenopathy:    She has no cervical adenopathy.  Neurological: She is alert and oriented to person, place, and time. Gait normal.  Skin: Skin is warm and dry. No rash noted. No erythema.  Psychiatric: Affect normal.  Nursing note reviewed.   LABORATORY DATA:. No visits with results within 3 Day(s) from this visit. Latest known visit with results is:  Appointment on 03/08/2014  Component Date Value Ref Range Status  . WBC 03/08/2014 5.8  3.9 - 10.3 10e3/uL Final  . NEUT# 03/08/2014 4.1  1.5 - 6.5 10e3/uL Final  . HGB 03/08/2014 14.5  11.6 - 15.9 g/dL Final  . HCT 03/08/2014 43.6  34.8 - 46.6 % Final  . Platelets 03/08/2014 371  145 - 400 10e3/uL Final  . MCV 03/08/2014 96.7  79.5 - 101.0 fL Final  . MCH 03/08/2014 32.3  25.1 - 34.0 pg Final  . MCHC 03/08/2014 33.4  31.5 - 36.0 g/dL Final  . RBC 03/08/2014 4.51  3.70 - 5.45 10e6/uL Final  . RDW 03/08/2014 12.9  11.2 - 14.5 % Final  . lymph# 03/08/2014 1.1  0.9 - 3.3 10e3/uL Final  . MONO# 03/08/2014 0.4  0.1 - 0.9 10e3/uL Final  . Eosinophils Absolute 03/08/2014 0.1  0.0 - 0.5 10e3/uL Final  . Basophils Absolute 03/08/2014 0.1  0.0 - 0.1 10e3/uL Final  . NEUT% 03/08/2014 70.6  38.4 - 76.8 % Final  . LYMPH% 03/08/2014 19.6  14.0 - 49.7 % Final  . MONO% 03/08/2014 6.4  0.0 - 14.0 % Final  . EOS% 03/08/2014 2.4  0.0 - 7.0 % Final  . BASO% 03/08/2014 1.0  0.0 - 2.0 % Final  . Sodium 03/08/2014 141  136 - 145 mEq/L Final  . Potassium 03/08/2014 4.1  3.5 - 5.1 mEq/L Final  . Chloride 03/08/2014 103  98 - 109 mEq/L Final  . CO2 03/08/2014 27  22 - 29 mEq/L Final  . Glucose 03/08/2014 88  70 - 140 mg/dl Final  . BUN 03/08/2014 13.0  7.0 - 26.0 mg/dL Final  . Creatinine 03/08/2014 0.9  0.6 - 1.1 mg/dL  Final  . Total Bilirubin 03/08/2014 0.41  0.20 - 1.20 mg/dL Final  . Alkaline Phosphatase 03/08/2014 88  40 - 150 U/L Final  . AST 03/08/2014 63* 5 - 34 U/L Final  . ALT 03/08/2014 111* 0 - 55 U/L Final  . Total Protein 03/08/2014 7.2  6.4 - 8.3 g/dL Final  . Albumin 03/08/2014 3.9  3.5 - 5.0 g/dL Final  . Calcium  03/08/2014 10.1  8.4 - 10.4 mg/dL Final  . Anion Gap 03/08/2014 10  3 - 11 mEq/L Final  . EGFR 03/08/2014 75* >90 ml/min/1.73 m2 Final   eGFR is calculated using the CKD-EPI Creatinine Equation (2009)     RADIOGRAPHIC STUDIES: No results found.  ASSESSMENT/PLAN:    Breast cancer of upper-inner quadrant of right female breast Currently undergoing letrozole therapy.  Patient continues to hold on initiation of Ibrance oral therapy.  Patient has plans to obtain a pet scan on 03/29/2014.  Patient will return for follow-up visit to review scan results on 04/05/2013.  Did advise patient and her husband that would try to move up the follow-up visit appointment time if at all possible; and let them know.  Patient will continue to obtain labs on an every three-week basis.  If patient's liver function values increase by 20% or more-the plan is to decrease the letrozole dosing.    Chest wall pain  Patient has a pea-sized firm nodule directly below her left upper chest wall biopsy site. This area is mildly tender with palpation.  She is also noted to have some upper sternal edema that does slightly radiate upwards towards the clavicle.  Patient has known left clavicle metastasis.  No evidence of erythema, warmth, or red streaks.   Patient denies any chest pain, chest pressure, increase shortness of breath , or pain with inspiration.Patient is observed with full range of motion.  Advised patient to continue with ibuprofen.     Of for the patient and earlier appointment for a pet scan; the patient made the decision to  Keep the originally planned date of 03/29/2014.  Pleural effusion   Patient continues with her Pleurx drain catheter to the left site.  Patient states that she continues to drain the pleural fluid twice weekly.  She last drained the Pleurx catheter just yesterday 03/20/2014.  She obtained approximately 350 ML's of pleural fluid.   Patient stated understanding of all instructions; and was in agreement with this plan of care. The patient knows to call the clinic with any problems, questions or concerns.   Review/collaboration with Dr. Jana Hakim regarding all aspects of patient's visit today.   Total time spent with patient was 25 minutes;  with greater than 75 percent of that time spent in face to face counseling regarding her symptoms, and coordination of care and follow up.  Disclaimer: This note was dictated with voice recognition software. Similar sounding words can inadvertently be transcribed and may not be corrected upon review.   Drue Second, NP 03/21/2014

## 2014-03-21 NOTE — Telephone Encounter (Signed)
Pt aware of NP/CB per 12/22 POF seen today at 1400 hours...Marland KitchenMarland KitchenMarland Kitchen KJ

## 2014-03-21 NOTE — Assessment & Plan Note (Addendum)
Patient has a pea-sized firm nodule directly below her left upper chest wall biopsy site. This area is mildly tender with palpation.  She is also noted to have some upper sternal edema that does slightly radiate upwards towards the clavicle.  Patient has known left clavicle metastasis.  No evidence of erythema, warmth, or red streaks.   Patient denies any chest pain, chest pressure, increase shortness of breath , or pain with inspiration.Patient is observed with full range of motion.  Advised patient to continue with ibuprofen.     Of for the patient and earlier appointment for a pet scan; the patient made the decision to  Keep the originally planned date of 03/29/2014.

## 2014-03-21 NOTE — Assessment & Plan Note (Signed)
Currently undergoing letrozole therapy.  Patient continues to hold on initiation of Ibrance oral therapy.  Patient has plans to obtain a pet scan on 03/29/2014.  Patient will return for follow-up visit to review scan results on 04/05/2013.  Did advise patient and her husband that would try to move up the follow-up visit appointment time if at all possible; and let them know.  Patient will continue to obtain labs on an every three-week basis.  If patient's liver function values increase by 20% or more-the plan is to decrease the letrozole dosing.

## 2014-03-21 NOTE — Assessment & Plan Note (Signed)
Patient continues with her Pleurx drain catheter to the left site.  Patient states that she continues to drain the pleural fluid twice weekly.  She last drained the Pleurx catheter just yesterday 03/20/2014.  She obtained approximately 350 ML's of pleural fluid.

## 2014-03-22 ENCOUNTER — Telehealth: Payer: Self-pay | Admitting: Oncology

## 2014-03-22 ENCOUNTER — Ambulatory Visit: Payer: 59 | Admitting: Physical Therapy

## 2014-03-22 DIAGNOSIS — I890 Lymphedema, not elsewhere classified: Secondary | ICD-10-CM

## 2014-03-22 DIAGNOSIS — R0789 Other chest pain: Secondary | ICD-10-CM

## 2014-03-22 NOTE — Therapy (Signed)
[place], [place], [address] Phone: [phone]   Fax:  [phone]  Physical Therapy Treatment  Patient Details  Name: [name] [name] MRN: [id number] Date of Birth: [birth year]  Encounter Date: 03/22/2014      PT End of Session - 03/22/14 1301    Visit Number 130   Number of Visits 133   Date for PT Re-Evaluation 04/03/14   PT Start Time 1104   PT Stop Time 1147   PT Time Calculation (min) 43 min      Past Medical History  Diagnosis Date  . Seizures 2010    Isolated incident.  . Breast cancer dx'd 2005/2011  . PONV (postoperative nausea and vomiting)   . Peripheral vascular disease 02/2010    blood clot related to porta cath    Past Surgical History  Procedure Laterality Date  . Breast lumpectomy  2005  . Axillary lymph node dissection  Dec. 2011  . Portacath placement  12/11  . Removal portacath    . Mediastinotomy chamberlain mcneil Left 06/02/2013    Procedure: MEDIASTINOTOMY CHAMBERLAIN MCNEIL;  Surgeon: [name], MD;  Location: [facility];  Service: Thoracic;  Laterality: Left;  LEFT ANTERIOR MEDIASTINOTOMY     There were no vitals taken for this visit.  Visit Diagnosis:  Lymphedema  Other chest pain      Subjective Assessment - 03/22/14 1258    Symptoms New pain developed at left chest (anterior) where there is a pea-sized lump near incision from previous biopsy, and lateral to that.   Pertinent History Saw nurse practitioner at the [facility] about this yesterday, she said it could "just be spread of metastases."   Currently in Pain? Yes   Pain Score --  not rated   Pain Location Chest   Pain Orientation Left   Pain Onset Yesterday                    [facility] Adult PT Treatment/Exercise - 03/22/14 0001    Manual Therapy   Edema Management Manual lymph drainage as on 03/20/14.   Massage At left upper back for pain relief and muscle  relaxation.   Myofascial Release At right axilla, soft tissue mobilization and myofascial release.                        [facility] Goals - 03/22/14 1303    CC Long Term Goal  #1   Status On-going   CC Long Term Goal  #2   Status On-going            Plan - 03/22/14 1301    Clinical Impression Statement New pain and feeling lumps at left chest have upset patient quite a bit with concern about whether cancer is spreading there.  Will have PET scan on 12/30 and see Dr. Jana Hakim on 03/30/14.   Pt will benefit from skilled therapeutic intervention in order to improve on the following deficits Increased edema;Pain;Increased fascial restricitons   Rehab Potential Good   Clinical Impairments Affecting Rehab Potential active cancer   PT Frequency 2x / week   PT Treatment/Interventions Manual techniques;Manual lymph drainage;Scar mobilization   PT Next Visit Plan Manual lymph drainage, myofascial release, soft tissue work.   Consulted and Agree with Plan of Care Patient   PT Plan Continue manual lymph drainage, soft tissue work, and myofascial release for pain and edema control.  Problem List Patient Active Problem List   Diagnosis Date Noted  . Chest wall pain 03/21/2014  . Abnormal LFTs (liver function tests) 09/12/2013  . Pleural effusion 08/18/2013  . 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 Breast cancer metastasized to multiple sites 08/18/2013    Sudeep Scheibel 03/22/2014, 1:04 PM  [place], [place], [address] Phone: [phone]   Fax:  [phone]   [name], [place]

## 2014-03-22 NOTE — Telephone Encounter (Signed)
S/w pt confirming MD visit per 12/22 POF...Marland KitchenMarland KitchenMarland Kitchen KJ

## 2014-03-27 ENCOUNTER — Ambulatory Visit: Payer: 59 | Admitting: Physical Therapy

## 2014-03-27 DIAGNOSIS — I890 Lymphedema, not elsewhere classified: Secondary | ICD-10-CM | POA: Diagnosis not present

## 2014-03-27 DIAGNOSIS — R0789 Other chest pain: Secondary | ICD-10-CM

## 2014-03-27 NOTE — Therapy (Signed)
[place], [place], [address] Phone: [phone]   Fax:  [phone]  Physical Therapy Treatment  Patient Details  Name: [name] [name] MRN: [id number] Date of Birth: Jun 16, 1960  Encounter Date: 03/27/2014      PT End of Session - 03/27/14 1204    Visit Number [id number]   Number of Visits 1155   Date for PT Re-Evaluation 04/03/14      Past Medical History  Diagnosis Date  . Seizures 2010    Isolated incident.  . Breast cancer dx'd 2005/2011  . PONV (postoperative nausea and vomiting)   . Peripheral vascular disease 02/2010    blood clot related to porta cath    Past Surgical History  Procedure Laterality Date  . Breast lumpectomy  2005  . Axillary lymph node dissection  Dec. 2011  . Portacath placement  12/11  . Removal portacath    . Mediastinotomy chamberlain mcneil Left 06/02/2013    Procedure: MEDIASTINOTOMY CHAMBERLAIN MCNEIL;  Surgeon: [name], MD;  Location: [facility];  Service: Thoracic;  Laterality: Left;  LEFT ANTERIOR MEDIASTINOTOMY     There were no vitals taken for this visit.  Visit Diagnosis:  Lymphedema  Other chest pain      Subjective Assessment - 03/27/14 1110    Symptoms Reports fullness at right axilla and left side; also sore at left anterior chest.   Currently in Pain? Yes   Pain Score 5    Pain Location Chest   Pain Orientation Left   Pain Descriptors / Indicators Constant;Pressure   Pain Relieving Factors Motrin                    [facility] Adult PT Treatment/Exercise - 03/27/14 0001    Manual Therapy   Edema Management manual lymph drainage in left sidelying: posterior interaxillary anastomosis right to left and right axillo-inguinal anastomosis; in supine:  short neck, left axilla and anterior interaxillary anastomosis, right groin and axillo-inguinal anastomosis, area between and around two right lateral breast scars, directing  toward pathways, and right upper arm.  In right sidelying, from periscapular area toward left groin.   Massage At left upper back, soft tissue work in right sidelying.   Myofascial Release At right axilla, soft tissue mobilization and release.                        [facility] Goals - 03/22/14 1303    CC Long Term Goal  #1   Status On-going   CC Long Term Goal  #2   Status On-going            Plan - 03/27/14 1205    Clinical Impression Statement Increased tissue swelling palpable today at right axilla area and left upper back, reduced with treatment.  Right upper arm appeared larger than left, but measurements not taken today.   PT Treatment/Interventions Manual techniques;Manual lymph drainage;Scar mobilization   PT Next Visit Plan Manual lymph drainage, myofascial release, soft tissue work.   Consulted and Agree with Plan of Care Patient        Problem List Patient Active Problem List   Diagnosis Date Noted  . Chest wall pain 03/21/2014  . Abnormal LFTs (liver function tests) 09/12/2013  . Pleural effusion 08/18/2013  . 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 Breast cancer metastasized to multiple sites 08/18/2013    [facility] 03/27/2014, 12:07 PM  [place], [place], [address] Phone: [phone]   Fax:  [phone]   [name], [place]

## 2014-03-29 ENCOUNTER — Encounter (HOSPITAL_COMMUNITY): Payer: Self-pay

## 2014-03-29 ENCOUNTER — Other Ambulatory Visit (HOSPITAL_BASED_OUTPATIENT_CLINIC_OR_DEPARTMENT_OTHER): Payer: 59

## 2014-03-29 ENCOUNTER — Encounter (HOSPITAL_COMMUNITY)
Admission: RE | Admit: 2014-03-29 | Discharge: 2014-03-29 | Disposition: A | Discharge status: Home / Self Care | Payer: 59 | Source: Ambulatory Visit | Attending: Oncology | Admitting: Oncology

## 2014-03-29 ENCOUNTER — Ambulatory Visit: Payer: 59 | Admitting: Physical Therapy

## 2014-03-29 ENCOUNTER — Other Ambulatory Visit: Payer: Self-pay | Admitting: *Deleted

## 2014-03-29 ENCOUNTER — Encounter: Payer: Self-pay | Admitting: Physical Therapy

## 2014-03-29 ENCOUNTER — Ambulatory Visit (HOSPITAL_COMMUNITY): Payer: 59

## 2014-03-29 ENCOUNTER — Ambulatory Visit: Payer: 59

## 2014-03-29 DIAGNOSIS — R7989 Other specified abnormal findings of blood chemistry: Secondary | ICD-10-CM

## 2014-03-29 DIAGNOSIS — C50919 Malignant neoplasm of unspecified site of unspecified female breast: Secondary | ICD-10-CM | POA: Insufficient documentation

## 2014-03-29 DIAGNOSIS — I890 Lymphedema, not elsewhere classified: Secondary | ICD-10-CM

## 2014-03-29 DIAGNOSIS — C50211 Malignant neoplasm of upper-inner quadrant of right female breast: Secondary | ICD-10-CM

## 2014-03-29 DIAGNOSIS — J90 Pleural effusion, not elsewhere classified: Secondary | ICD-10-CM | POA: Diagnosis not present

## 2014-03-29 DIAGNOSIS — R599 Enlarged lymph nodes, unspecified: Secondary | ICD-10-CM | POA: Insufficient documentation

## 2014-03-29 DIAGNOSIS — C7989 Secondary malignant neoplasm of other specified sites: Secondary | ICD-10-CM | POA: Insufficient documentation

## 2014-03-29 DIAGNOSIS — C50911 Malignant neoplasm of unspecified site of right female breast: Secondary | ICD-10-CM

## 2014-03-29 DIAGNOSIS — R945 Abnormal results of liver function studies: Secondary | ICD-10-CM

## 2014-03-29 DIAGNOSIS — R0789 Other chest pain: Secondary | ICD-10-CM

## 2014-03-29 DIAGNOSIS — C50412 Malignant neoplasm of upper-outer quadrant of left female breast: Secondary | ICD-10-CM

## 2014-03-29 LAB — COMPREHENSIVE METABOLIC PANEL (CC13)
ALBUMIN: 4 g/dL (ref 3.5–5.0)
ALK PHOS: 93 U/L (ref 40–150)
ALT: 82 U/L — AB (ref 0–55)
AST: 45 U/L — AB (ref 5–34)
Anion Gap: 10 mEq/L (ref 3–11)
BILIRUBIN TOTAL: 0.42 mg/dL (ref 0.20–1.20)
BUN: 15.4 mg/dL (ref 7.0–26.0)
CHLORIDE: 103 meq/L (ref 98–109)
CO2: 27 meq/L (ref 22–29)
Calcium: 10.1 mg/dL (ref 8.4–10.4)
Creatinine: 0.9 mg/dL (ref 0.6–1.1)
EGFR: 76 mL/min/{1.73_m2} — AB (ref >90)
Glucose: 92 mg/dl (ref 70–140)
POTASSIUM: 4.1 meq/L (ref 3.5–5.1)
SODIUM: 140 meq/L (ref 136–145)
TOTAL PROTEIN: 7.4 g/dL (ref 6.4–8.3)

## 2014-03-29 LAB — CBC WITH DIFFERENTIAL/PLATELET
BASO%: 0.6 % (ref 0.0–2.0)
Basophils Absolute: 0 10*3/uL (ref 0.0–0.1)
EOS%: 2.2 % (ref 0.0–7.0)
Eosinophils Absolute: 0.2 10*3/uL (ref 0.0–0.5)
HCT: 41.1 % (ref 34.8–46.6)
HEMOGLOBIN: 14 g/dL (ref 11.6–15.9)
LYMPH%: 24.9 % (ref 14.0–49.7)
MCH: 32 pg (ref 25.1–34.0)
MCHC: 34.1 g/dL (ref 31.5–36.0)
MCV: 94.1 fL (ref 79.5–101.0)
MONO#: 0.5 10*3/uL (ref 0.1–0.9)
MONO%: 7.3 % (ref 0.0–14.0)
NEUT#: 4.3 10*3/uL (ref 1.5–6.5)
NEUT%: 65 % (ref 38.4–76.8)
Platelets: 354 10*3/uL (ref 145–400)
RBC: 4.37 10*6/uL (ref 3.70–5.45)
RDW: 12.5 % (ref 11.2–14.5)
WBC: 6.7 10*3/uL (ref 3.9–10.3)
lymph#: 1.7 10*3/uL (ref 0.9–3.3)

## 2014-03-29 LAB — GLUCOSE, CAPILLARY: Glucose-Capillary: 76 mg/dL (ref 70–99)

## 2014-03-29 MED ORDER — FLUDEOXYGLUCOSE F - 18 (FDG) INJECTION
9.2000 | Freq: Once | INTRAVENOUS | Status: AC | PRN
  Administered 2014-03-29: 9.2 via INTRAVENOUS

## 2014-03-29 NOTE — Therapy (Signed)
[place], [place], [address] Phone: [phone]   Fax:  [phone]  Physical Therapy Treatment  Patient Details  Name: [name] [name] MRN: [id number] Date of Birth: 1960-04-29  Encounter Date: 03/29/2014      PT End of Session - 03/29/14 1235    Visit Number 132   Number of Visits 133   Date for PT Re-Evaluation 04/03/14   PT Start Time 1020   PT Stop Time 1102   PT Time Calculation (min) 42 min   Activity Tolerance Patient tolerated treatment well      Past Medical History  Diagnosis Date  . Seizures 2010    Isolated incident.  . Breast cancer dx'd 2005/2011  . PONV (postoperative nausea and vomiting)   . Peripheral vascular disease 02/2010    blood clot related to porta cath    Past Surgical History  Procedure Laterality Date  . Breast lumpectomy  2005  . Axillary lymph node dissection  Dec. 2011  . Portacath placement  12/11  . Removal portacath    . Mediastinotomy [name] mcneil Left 06/02/2013    Procedure: MEDIASTINOTOMY [name] MCNEIL;  Surgeon: [name], MD;  Location: [facility];  Service: Thoracic;  Laterality: Left;  LEFT ANTERIOR MEDIASTINOTOMY     There were no vitals taken for this visit.  Visit Diagnosis:  Lymphedema  Other chest pain                  [facility] Adult PT Treatment/Exercise - 03/29/14 0001    Manual Therapy   Edema Management Manual lymph drainage as on 03/27/14.   Massage As on 03/27/14.   Myofascial Release As on 03/27/14.                        [facility] Goals - 03/22/14 1303    CC Long Term Goal  #1   Status On-going   CC Long Term Goal  #2   Status On-going            Plan - 03/29/14 1236    Clinical Impression Statement Soft tissue in areas treated is more pliable and less full feeling today; still with fullness between scars at right lateral breast.  Continues to have a  palpable improvement within treatment session for decreased induration in these areas.   Pt will benefit from skilled therapeutic intervention in order to improve on the following deficits Increased edema;Pain;Increased muscle spasms   Rehab Potential Good   Clinical Impairments Affecting Rehab Potential active cancer   PT Frequency 2x / week   PT Duration 2 weeks   PT Treatment/Interventions Manual techniques;Manual lymph drainage;Scar mobilization   PT Next Visit Plan Manual lymph drainage, myofascial release, soft tissue work.   PT Plan Continue manual lymph drainage, soft tissue work, and myofascial release for pain and edema control.        Problem List Patient Active Problem List   Diagnosis Date Noted  . Chest wall pain 03/21/2014  . Abnormal LFTs (liver function tests) 09/12/2013  . Pleural effusion 08/18/2013  . 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 Breast cancer metastasized to multiple sites 08/18/2013    [facility] 03/29/2014, 12:39 PM  [place], [place], [address] Phone: [phone]   Fax:  [phone]  [name], [place]

## 2014-03-30 ENCOUNTER — Ambulatory Visit (HOSPITAL_BASED_OUTPATIENT_CLINIC_OR_DEPARTMENT_OTHER): Payer: 59 | Admitting: Oncology

## 2014-03-30 DIAGNOSIS — R0789 Other chest pain: Secondary | ICD-10-CM

## 2014-03-30 DIAGNOSIS — R945 Abnormal results of liver function studies: Secondary | ICD-10-CM

## 2014-03-30 DIAGNOSIS — C773 Secondary and unspecified malignant neoplasm of axilla and upper limb lymph nodes: Secondary | ICD-10-CM

## 2014-03-30 DIAGNOSIS — C50919 Malignant neoplasm of unspecified site of unspecified female breast: Secondary | ICD-10-CM

## 2014-03-30 DIAGNOSIS — J90 Pleural effusion, not elsewhere classified: Secondary | ICD-10-CM

## 2014-03-30 DIAGNOSIS — R7989 Other specified abnormal findings of blood chemistry: Secondary | ICD-10-CM

## 2014-03-30 DIAGNOSIS — C50211 Malignant neoplasm of upper-inner quadrant of right female breast: Secondary | ICD-10-CM

## 2014-03-30 DIAGNOSIS — Z170 Estrogen receptor positive status [ER+]: Secondary | ICD-10-CM

## 2014-03-30 DIAGNOSIS — C7802 Secondary malignant neoplasm of left lung: Secondary | ICD-10-CM

## 2014-03-30 DIAGNOSIS — C781 Secondary malignant neoplasm of mediastinum: Secondary | ICD-10-CM

## 2014-03-30 DIAGNOSIS — M8580 Other specified disorders of bone density and structure, unspecified site: Secondary | ICD-10-CM

## 2014-03-30 MED ORDER — EVEROLIMUS 10 MG PO TABS: 10.0000 mg | ORAL_TABLET | Freq: Every day | ORAL | Status: DC

## 2014-03-30 MED ORDER — EXEMESTANE 25 MG PO TABS: 25.0000 mg | ORAL_TABLET | Freq: Every day | ORAL | Status: DC

## 2014-03-30 NOTE — Addendum Note (Signed)
Addended by: Jaci Carrel A on: 03/30/2014 02:39 PM   Modules accepted: Medications

## 2014-03-30 NOTE — Progress Notes (Signed)
[name] [name]  [phone] [phone]     ID: [name] [name] OB: [birth year]  MR#: [id number]  AST#:[id number]  [name]: [name] [name] GYN:  [name] [name] SU:  OTHER MD: [name] [name], [name] [name], [name] [name]  CHIEF COMPLAINT: Stage IV breast cancer CURRENT TREATMENT: Letrozole  BREAST CANCER HISTORY: From doctor [name] [name]'s intake note 03/20/2004:  "The patient is a very pleasant 53 year old female, without significant past medical history.  Her family history is significant for a sister who at age 15 was diagnosed with invasive ductal carcinoma.  She is a breast cancer survivor at age 51 now.  The patient states that she has never really had a screening mammogram until October 2005, when she felt that it was time for her to start having mammograms done on a yearly basis.  Therefore, on 01/26/04, she underwent a screening mammogram and an abnormality was detected in the upper outer right breast.  She, therefore, underwent spot compression views of both the right and the left breast.  The left breast revealed a well-defined mass in the upper outer left quadrant, present at the 2 o'clock position, measuring 1.8 cm, 6 cm from the nipple.  This, by ultrasound, was felt to be a simple cyst measuring 1.8 cm.  On the right breast, a spiculated mass was noted in the upper outer right quadrant.  The ultrasound revealed a shadowing irregular solid mass at the 10:30 position, 9 cm from the nipple, measuring 1.2 cm in greatest dimension, correlating with the spiculated mass seen on the mammogram.  The right axilla was negative ultrasonically.  Because of this, the patient underwent a needle biopsy of the right breast and the biopsy was positive invasive mammary carcinoma that showed features consistent with a high-grade invasive ductal carcinoma associated with desmoplastic stroma.  No in situ component was seen and no definite lymphovascular  invasion was identified.  On the core biopsy, the tumor measured about 0.8 cm.  Because of this, she was seen by [name] [name] and the patient was taken to the [name] on March 15, 2004.  She underwent a right breast lumpectomy with sentinel node biopsy.  The final pathology revealed an invasive ductal carcinoma, measuring 1.7 cm, grade 2 of 3.  Margins were free of tumor.  Atypical lobular hyperplasia was noted.  One sentinel node was removed which was negative for metastatic disease.  The tumor was staged at T1c, N0 MX.  It was estrogen receptor positive, progesterone receptor positive.  HER-2/neu was 2+.  FISH was negative.  All margins were free of tumor.  She is now seen in Medical Oncology for further evaluation and management of this newly diagnosed T1c, node negative, stage I, invasive ductal carcinoma of the right breast."  Her subsequent history is as detailed below  INTERVAL HISTORY: [name] [name] returns today for followup of her stage IV breast cancer accompanied by her husband [name] [name]. They enjoyed Christmas, which today spent with family. She continues to have her left pleural effusion drained twice a week. She has been getting a total of as low as 375 or as high as 650 mL. There is no particular trend. Those totals are scanned separately. She has been tolerating the letrozole relatively well, although she feels it causes her to have loose bowel movements most mornings. She has taken it every day and has not missed any doses.  REVIEW OF SYSTEMS: She walks up to 2 miles with her husband and dog's mostly  on weekends. By the end of the 2 mile she is feeling a little bit winded, but she gets it done. She gets pretty tired by the middle of the afternoon. She has noted a bump right at the biopsy site on the left chest wall, which is new to her although it was noted on the PET scan from October. She is very alarmed by this and would like to have it removed. She continues to have discomfort on the left  side, but she feels her lymphedema is a little bit better and she continues to work with physical therapy in that regard. There have been no unusual headaches visual changes or vomiting, though she says she has nausea multiple mornings. She does not vomit except perhaps once a month or so. She is having loose bowel movements in the mornings as stated and she had several interrupting the visit this morning. Denies significant cough or phlegm production. Denies pleurisy or hemoptysis. Has good bladder control. There is no peripheral edema. A detailed review of systems today was otherwise stable  PAST MEDICAL HISTORY: Past Medical History  Diagnosis Date  . Seizures 2010    Isolated incident.  . Breast cancer dx'd 2005/2011  . PONV (postoperative nausea and vomiting)   . Peripheral vascular disease 02/2010    blood clot related to porta cath    PAST SURGICAL HISTORY: Past Surgical History  Procedure Laterality Date  . Breast lumpectomy  2005  . Axillary lymph node dissection  Dec. 2011  . Portacath placement  12/11  . Removal portacath    . Mediastinotomy chamberlain mcneil Left 06/02/2013    Procedure: MEDIASTINOTOMY CHAMBERLAIN MCNEIL;  Surgeon: [name] Nakayama, MD;  Location: [facility];  Service: Thoracic;  Laterality: Left;  LEFT ANTERIOR MEDIASTINOTOMY     FAMILY HISTORY No family history on file. The patient's father is living, 16 years old as of may 2015. He lives in [place]. The patient's mother died from complications of COPD at the age of 89. These has 2 brothers, one sister. Her sister developed breast cancer at the age of 71. She is doing well. The patient herself underwent genetic testing at [name] in 2011 and was found to be BRCA negative  GYNECOLOGIC HISTORY:  Menarche age 52, she is GX P0. She stopped having periods with her initial chemotherapy in 2006.  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 Her husband [name] [name] is a Medical illustrator in [place]. He has a child from a prior marriage. At home they have 2 rescue dogs, Hobo and [place]. The patient is religious but not a church attender    ADVANCED DIRECTIVES: In place   HEALTH MAINTENANCE: History  Substance Use Topics  . Smoking status: Never Smoker   . Smokeless tobacco: Never Used  . Alcohol Use: No     Colonoscopy:  PAP:  Bone density: March 2015; mild osteopenia  Lipid panel:  Allergies  Allergen Reactions  . Decadron [Dexamethasone] Other (See Comments)    Patient does not tolerate steroids.   . Enoxaparin Other (See Comments)    unknown  . Hydromorphone Hcl Nausea And Vomiting  . Morphine And Related   . Tegaderm Ag Mesh [Silver]     Current Outpatient Prescriptions  Medication Sig Dispense Refill  . ALPRAZolam (XANAX) 0.5 MG tablet Take 1 tablet (0.5 mg total) by mouth 2 (two) times daily as needed for anxiety. 30 tablet  0  . b complex vitamins capsule Take 1 capsule by mouth daily.    . Calcium-Magnesium-Vitamin D (CITRACAL CALCIUM+D PO) Take by mouth. 424m vitamin C, 5029mvitamin D3 2 PO DAILY    . folic acid (FOLVITE) 1 MG tablet Take 1 mg by mouth daily.    . Marland Kitchenbuprofen (ADVIL,MOTRIN) 200 MG tablet Take 200 mg by mouth every 4 (four) hours as needed.    . Marland Kitchenetrozole (FEMARA) 2.5 MG tablet TAKE 1 TABLET BY MOUTH EVERY DAY 30 tablet 2  . Melatonin 1 MG TABS Take 3 mg by mouth at bedtime as needed (sleep).     . palbociclib (IBRANCE) 125 MG capsule Take 125 mg by mouth.    . Marland KitchenITAMIN D, CHOLECALCIFEROL, PO Take 2,000 Units by mouth daily.     . [DISCONTINUED] metoCLOPramide (REGLAN) 10 MG tablet Take 1 tablet (10 mg total) by mouth 4 (four) times daily -  before meals and at bedtime. 60 tablet 3   No current facility-administered medications for this visit.    OBJECTIVE: Middle-aged white woman who appears stated age Fi32itals:     Body mass index is 0.00 kg/(m^2).   Filed Vitals:   Patient  refused weight and vitals today    ECOG FS:1 - Symptomatic but completely ambulatory  Sclerae unicteric, pupils round and equal Oropharynx clear, no thrush or other lesions No cervical or supraclavicular adenopathy Lungs no rales or rhonchi; dullness to percussion lower one third of left lung Heart regular rate and rhythm Abd soft, nontender, positive bowel sounds MSK no focal spinal tenderness, no upper extremity lymphedema; Pleurx catheter in the left flank intact Neuro: nonfocal, well oriented, anxious affect Breasts: There is a 1 cm subcutaneous mass which is not erythematous right under the left chest wall scar. This feels hard and fixed    LAB RESULTS:  CMP     Component Value Date/Time   NA 140 03/29/2014 1250   NA 134* 09/03/2013 2145   K 4.1 03/29/2014 1250   K 3.7 09/03/2013 2145   CL 97 09/03/2013 2145   CL 105 05/06/2012 1333   CO2 27 03/29/2014 1250   CO2 23 09/03/2013 2145   GLUCOSE 92 03/29/2014 1250   GLUCOSE 121* 09/03/2013 2145   GLUCOSE 124* 05/06/2012 1333   BUN 15.4 03/29/2014 1250   BUN 8 09/03/2013 2145   CREATININE 0.9 03/29/2014 1250   CREATININE 0.64 09/03/2013 2145   CALCIUM 10.1 03/29/2014 1250   CALCIUM 9.0 09/03/2013 2145   PROT 7.4 03/29/2014 1250   PROT 6.8 09/03/2013 2145   ALBUMIN 4.0 03/29/2014 1250   ALBUMIN 3.3* 09/03/2013 2145   AST 45* 03/29/2014 1250   AST 55* 09/03/2013 2145   ALT 82* 03/29/2014 1250   ALT 85* 09/03/2013 2145   ALKPHOS 93 03/29/2014 1250   ALKPHOS 114 09/03/2013 2145   BILITOT 0.42 03/29/2014 1250   BILITOT 0.3 09/03/2013 2145   GFRNONAA >90 09/03/2013 2145   GFRAA >90 09/03/2013 2145    No results found for: SPEP  Lab Results  Component Value Date   WBC 6.7 03/29/2014   NEUTROABS 4.3 03/29/2014   HGB 14.0 03/29/2014   HCT 41.1 03/29/2014   MCV 94.1 03/29/2014   PLT 354 03/29/2014      Chemistry      Component Value Date/Time   NA 140 03/29/2014 1250   NA 134* 09/03/2013 2145   K 4.1  03/29/2014 1250   K 3.7 09/03/2013 2145   CL 97  09/03/2013 2145   CL 105 05/06/2012 1333   CO2 27 03/29/2014 1250   CO2 23 09/03/2013 2145   BUN 15.4 03/29/2014 1250   BUN 8 09/03/2013 2145   CREATININE 0.9 03/29/2014 1250   CREATININE 0.64 09/03/2013 2145      Component Value Date/Time   CALCIUM 10.1 03/29/2014 1250   CALCIUM 9.0 09/03/2013 2145   ALKPHOS 93 03/29/2014 1250   ALKPHOS 114 09/03/2013 2145   AST 45* 03/29/2014 1250   AST 55* 09/03/2013 2145   ALT 82* 03/29/2014 1250   ALT 85* 09/03/2013 2145   BILITOT 0.42 03/29/2014 1250   BILITOT 0.3 09/03/2013 2145       Lab Results  Component Value Date   LABCA2 25 11/02/2007    No components found for: [id number]  No results for input(s): INR in the last 168 hours.  Urinalysis    Component Value Date/Time   COLORURINE YELLOW 09/03/2013 2109   APPEARANCEUR CLEAR 09/03/2013 2109   LABSPEC 1.005 09/12/2013 1542   LABSPEC 1.016 09/03/2013 2109   PHURINE 6.0 09/03/2013 2109   GLUCOSEU Negative 09/12/2013 [place] 09/03/2013 2109   HGBUR NEGATIVE 09/03/2013 2109   BILIRUBINUR NEGATIVE 09/03/2013 2109   KETONESUR NEGATIVE 09/03/2013 2109   PROTEINUR NEGATIVE 09/03/2013 2109   UROBILINOGEN 0.2 09/12/2013 1542   UROBILINOGEN 0.2 09/03/2013 2109   NITRITE NEGATIVE 09/03/2013 2109   LEUKOCYTESUR NEGATIVE 09/03/2013 2109    STUDIES: Nm Pet Image Restag (ps) Skull Base To Thigh  03/29/2014   CLINICAL DATA:  Subsequent treatment strategy for metastatic breast cancer.  EXAM: NUCLEAR MEDICINE PET SKULL BASE TO THIGH  TECHNIQUE: 9.2 mCi F-18 FDG was injected intravenously. Full-ring PET imaging was performed from the skull base to thigh after the radiotracer. CT data was obtained and used for attenuation correction and anatomic localization.  FASTING BLOOD GLUCOSE:  Value: 76 mg/dl  COMPARISON:  01/25/2014.  FINDINGS: NECK  No hypermetabolic lymph nodes in the neck.  CHEST  As before, there is markedly  hypermetabolic disease involving the posterior aspect of the upper left breast, anterior and medial left pleura inferiorly extensive involvement of the posterior and lateral left pleura. Hypermetabolic lymphadenopathy is identified in subcarinal lymph nodes.  An index nodule measured in the medial aspect of the upper left breast previously had SUV max = 3.2. This same hypermetabolic nodule has SUV max = 4.7 today.  The index soft tissue measured previously as a large prevascular nodal mass or pleural-based mass in the medial aspect of the upper left hemi thorax was 6.4 x 3.1 cm previously compared to 7.3 x 3.3 cm today. Increased SUV max = 14.3 for this region today compared to 8.2 previously. The 14 mm short axis hypermetabolic subcarinal lymph node seen on the previous study is now 18 mm in short axis. SUV max = 7.4 on today's study compared to 6.1 previously.  Stable moderate left pleural effusion with left pleural drain still visualized in-situ.  ABDOMEN/PELVIS  The previously characterized soft tissue nodularity along the medial aspect of the left hemi diaphragmatic crus measures 2.4 x 1.4 cm today compared to 2.3 x 1.5 cm previously. This hypermetabolic tissue has SUV max = 9.3 today compared to 7.0 previously.  Left para-aortic hypermetabolic lymph nodes are not substantially changed, with the more lateral of these 2 lymph nodes measuring 9 mm in short axis today compared 8 mm previously.  SKELETON  No focal hypermetabolic activity to suggest skeletal metastasis.  IMPRESSION: As before,  there is extensive abnormal soft tissue metastases in the left chest wall and almost circumferentially along the left pleura. There is associated hypermetabolic lymphadenopathy in the lower mediastinum, adjacent to the left inferior and medial diaphragmatic crus, and in the left para-aortic space the upper abdomen. Soft tissue measurements indicate continued slight progression of this disease and FDG accumulation within the  metastatic disease has increased at all measured sites.   Electronically Signed   By: Misty Stanley M.D.   On: 03/29/2014 16:16    ASSESSMENT: 53 y.o. [place] woman with stage IV breast cancer, history as follows  (1)  S/p Right lumpectomy and sentinel lymph node sampling 03/15/2004 for a pT1c pN0. Stage IA invasive ductal carcinoma, grade 2, estrogen receptor 95% positive, progesterone receptor 65% positive, HER-2 not amplified; additional surgery 04/25/2004 for seroma or clearance showed no residual tumor  (2) adjuvant chemotherapy with cyclophosphamide and doxorubicin every 21 days x4 completed 07/19/2004  (3) adjuvant radiation given under Dr. Donella Stade in [place] completed July 2006  (4) the patient opted against adjuvant antiestrogen therapy  (5) genetics testing showed no BRCA mutations  (6) biopsy of a palpable right axillary mass 10/24/2009 showed invasive ductal carcinoma, grade 3, estrogen receptor 100% positive, progesterone receptor 2% positive (alert score 5) HER-2 negative; no evidence of systemic disease on PET scanning  (7) completed 3 of 4 planned cycles of docetaxel and cyclophosphamide September 2011, fourth cycle omitted because of marked elevations in liver function tests  (8) an right axillary lymph node dissection 03/06/2010 showed 3/8 lymph nodes removed to be involved by tumor, with extracapsular extension.  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, TTS-1 negative), estrogen receptor 80% positive, progesterone receptor 2% positive, HER-2 not amplified  (12)  letrozole started March 2015-- tolerated with significant side effects, discontinued at the end of May 2015  (13) PET scan 08/16/2013 shows extensive left pleural metastatic disease and a large left pleural effusion that shifts cardiac and mediastinal structures to the right; adenopathy (celiac trunk, periadrenal, periaortic); and a left medial clavicular lesion; Status post left thoracentesis 08/16/2013 positive for adenocarcinoma, estrogen receptor positive, progesterone receptor negative.  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 We reviewed her PET scan, which clearly shows disease progression. There is both increase in size of the tumor  mass in the left pleural area and also increased uptake in other areas.  She is very upset at the little bump right under her scar on the left chest wall. She wanted that removed. After looking at the PET scan it became obvious that removing a little teeny bit of tumor is not going to do much for the  rather large amount of tumor she has around her left chest wall and left lung.  She is interested in considering radiation and I am referring her to Dr. [name] area it may be that at some point some kind of palliative radiation to the left lung area may be helpful. Of course what she really needs a systemic disease. We again discussed the difference between anti-estrogens and chemotherapy. At this point we are going to give anti-estrogens while last try. We are going to start exemestane and everolimus. This is in line also with her Foundation 1 results. If this does not work I would probably suggest we move to carboplatin and either gemcitabine or taxanes.  We discussed the possible toxicities, side effects, complications and cost of exemestane and everolimus. I have put the exemestane prescriptions through to her specialty pharmacy, but the everolimus we are going through the [facility], where there are several Price support programs in place. Hopefully she will be able to obtain these medications within the next week or so. Whenever she obtains exemestane she can start that. She will hold off on the everolimus though until she returns to see me January 15.  Keilynn has a good understanding of the overall plan. She agrees with it. She knows the goal of treatment in her case is control. She will call with any problems that may develop before her next visit here.     Chauncey Cruel, MD   03/30/2014 9:40 AM

## 2014-04-03 ENCOUNTER — Telehealth: Payer: Self-pay | Admitting: *Deleted

## 2014-04-03 ENCOUNTER — Ambulatory Visit: Payer: 59 | Attending: Oncology | Admitting: Physical Therapy

## 2014-04-03 DIAGNOSIS — R0789 Other chest pain: Secondary | ICD-10-CM

## 2014-04-03 DIAGNOSIS — I890 Lymphedema, not elsewhere classified: Secondary | ICD-10-CM | POA: Diagnosis not present

## 2014-04-03 NOTE — Therapy (Signed)
[place], [place], [address] Phone: [phone]   Fax:  [phone]  Physical Therapy Treatment  Patient Details  Name: [name] [name] MRN: [id number] Date of Birth: 23-Jun-1960  Encounter Date: 04/03/2014      PT End of Session - 04/03/14 1244    Visit Number 1  133rd visit, 1st for 2016.   Number of Visits 25   Date for PT Re-Evaluation 06/16/14   PT Start Time 1021   PT Stop Time 1100   PT Time Calculation (min) 39 min      Past Medical History  Diagnosis Date  . Seizures 2010    Isolated incident.  . Breast cancer dx'd 2005/2011  . PONV (postoperative nausea and vomiting)   . Peripheral vascular disease 02/2010    blood clot related to porta cath    Past Surgical History  Procedure Laterality Date  . Breast lumpectomy  2005  . Axillary lymph node dissection  Dec. 2011  . Portacath placement  12/11  . Removal portacath    . Mediastinotomy chamberlain mcneil Left 06/02/2013    Procedure: MEDIASTINOTOMY CHAMBERLAIN MCNEIL;  Surgeon: [name], MD;  Location: [facility];  Service: Thoracic;  Laterality: Left;  LEFT ANTERIOR MEDIASTINOTOMY     There were no vitals taken for this visit.  Visit Diagnosis:  Lymphedema - Plan: PT plan of care cert/re-cert  Other chest pain - Plan: PT plan of care cert/re-cert      Subjective Assessment - 04/03/14 1239    Symptoms PET scan last Wednesday showed progression of disease; cancer is around left lung and at upper left breast now.    Pertinent History Pt. saw Dr. Jana Hakim on 03/30/14 and is now considering new treatment options.     Currently in Pain? Yes   Pain Score --  not rated; discomfort at right axilla area                    [facility] Adult PT Treatment/Exercise - 04/03/14 0001    Manual Therapy   Edema Management Manual lymph drainage in left sidelying to posterior interaxillary anastomosis right to left and  right axillo-inguinal anastomosis; in supine, left axilla and anterior interaxillary anastomosis, right groin and axilloinguinal anastomosis, and area around incisions at right lateral breast, directing toward pathways; in right sidelying, from left periscapular area toward left groin.   Massage In right sidelying, to left upper back and neck.   Myofascial Release At right axillar and superior of two breast scars.                        [facility] Goals - 03/22/14 1303    CC Long Term Goal  #1   Status On-going   CC Long Term Goal  #2   Status On-going            Plan - 04/03/14 1245    Clinical Impression Statement Right axilla area and right upper breast are visibly more swollen today; patient also feels more swelling in right upper arm.  Pt. noted decreased pain after session ending with soft tissue work.   Pt will benefit from skilled therapeutic intervention in order to improve on the following deficits Pain;Increased edema   Rehab Potential Good   Clinical Impairments Affecting Rehab Potential active cancer, with recent evidence of spread   PT Frequency 2x / week   PT Duration 12 weeks  PT Treatment/Interventions Manual lymph drainage;Manual techniques;Scar mobilization   PT Next Visit Plan Manual lymph drainage, myofascial release, soft tissue work.   Consulted and Agree with Plan of Care Patient        Problem List Patient Active Problem List   Diagnosis Date Noted  . Chest wall pain 03/21/2014  . Abnormal LFTs (liver function tests) 09/12/2013  . Pleural effusion 08/18/2013  . 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 Breast cancer metastasized to multiple sites 08/18/2013    [facility] 04/03/2014, 12:49 PM  [place], [place], [address] Phone: [phone]   Fax:  [phone]  [name], [place]

## 2014-04-03 NOTE — Telephone Encounter (Signed)
This RN received message from pt stating " I would like to speak with Dr Jana Hakim after thinking about what was discussed at the last visit"  " I started the exemestane but due to the area in my right upper chest which at times has a sensation of choking - I would like to consider surgery "  " So if I was to start the everolimus could surgery be done while on it and the exemestane ?"  " I would want Dr [name] to do the surgery but want to talk with Dr Jana Hakim again ".  This note will be given to MD for review.

## 2014-04-04 ENCOUNTER — Telehealth: Payer: Self-pay | Admitting: *Deleted

## 2014-04-04 ENCOUNTER — Telehealth: Payer: Self-pay | Admitting: Oncology

## 2014-04-04 NOTE — Telephone Encounter (Signed)
Spoke with patient this morning regarding needing an appointment with Dr. Jana Hakim to discuss possible surgical procedures. Per Dr. Jana Hakim, he can see the patient on Friday, 04/07/14 at 3:30 pm. Patient verbalized understanding. POF sent to scheduling.

## 2014-04-04 NOTE — Telephone Encounter (Signed)
per pof to sch pt appt-per Staci(nurse)pt aware of appt

## 2014-04-05 ENCOUNTER — Ambulatory Visit: Payer: Self-pay | Admitting: Oncology

## 2014-04-05 ENCOUNTER — Ambulatory Visit: Payer: 59 | Admitting: Physical Therapy

## 2014-04-05 DIAGNOSIS — I890 Lymphedema, not elsewhere classified: Secondary | ICD-10-CM

## 2014-04-05 DIAGNOSIS — R0789 Other chest pain: Secondary | ICD-10-CM

## 2014-04-05 NOTE — Therapy (Signed)
[place], [place], [address] Phone: [phone]   Fax:  [phone]  Physical Therapy Treatment  Patient Details  Name: [name] [name] MRN: [id number] Date of Birth: 03/30/1961 Referring Provider:  Deatra Robinson, MD  Encounter Date: 04/05/2014      PT End of Session - 04/05/14 1658    Visit Number 2   Number of Visits 25   Date for PT Re-Evaluation 06/16/14   PT Start Time 0931   PT Stop Time 1013   PT Time Calculation (min) 42 min      Past Medical History  Diagnosis Date  . Seizures 2010    Isolated incident.  . Breast cancer dx'd 2005/2011  . PONV (postoperative nausea and vomiting)   . Peripheral vascular disease 02/2010    blood clot related to porta cath    Past Surgical History  Procedure Laterality Date  . Breast lumpectomy  2005  . Axillary lymph node dissection  Dec. 2011  . Portacath placement  12/11  . Removal portacath    . Mediastinotomy chamberlain mcneil Left 06/02/2013    Procedure: MEDIASTINOTOMY CHAMBERLAIN MCNEIL;  Surgeon: [name], MD;  Location: [place];  Service: Thoracic;  Laterality: Left;  LEFT ANTERIOR MEDIASTINOTOMY     There were no vitals taken for this visit.  Visit Diagnosis:  Lymphedema  Other chest pain      Subjective Assessment - 04/05/14 1655    Symptoms A little better today in right axilla and upper arm in terms of decreased swelling.  Having to manage when she takes new cancer meds due to the nausea it causes.   Pertinent History Has started new meds.   Currently in Pain? Other (Comment)  no pain reported today                    [facility] Adult PT Treatment/Exercise - 04/05/14 0001    Manual Therapy   Edema Management Manual lymph drainage as on 04/03/14 and including right upper arm today.   Massage As on 04/03/14.   Myofascial Release Soft tissue mobilization and myofascial release at right axillary  scar--brief today.                        [facility] Goals - 03/22/14 1303    CC Long Term Goal  #1   Status On-going   CC Long Term Goal  #2   Status On-going            Plan - 04/05/14 1658    Clinical Impression Statement Right axill and right upper/outer breast less swollen in appearance and feel today.        Problem List Patient Active Problem List   Diagnosis Date Noted  . Chest wall pain 03/21/2014  . Abnormal LFTs (liver function tests) 09/12/2013  . Pleural effusion 08/18/2013  . 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 Breast cancer metastasized to multiple sites 08/18/2013    [facility] 04/05/2014, 5:00 PM  [place], [place], [address] Phone: [phone]   Fax:  [phone]  [name], [place]

## 2014-04-05 NOTE — Progress Notes (Signed)
Thoracic Location of Tumor / Histology: Left Pleural Metastases with Left Plueral Effusion, Primary Right Breast.  letf upper chest Nodule   PET scan 08/16/2013 shows extensive left pleural metastatic disease and a large left pleural effusion that shifts cardiac and mediastinal structures to the right; adenopathy (celiac trunk, periadrenal, periaortic); and a left medial clavicular lesion; Status post left thoracentesis 08/16/2013 positive for adenocarcinoma, estrogen receptor positive, progesterone receptor negative.    Currently has a symptomatic left pleural effusion, s/p Pleurx placement 09/01/2013  on March 15, 2004 Dr. [name] performed a right breast lumpectomy with sentinel node biopsy. The final pathology revealed an invasive ductal carcinoma, measuring 1.7 cm, grade 2 of 3. Margins were free of tumor. Atypical lobular hyperplasia was noted. One sentinel node was removed which was negative for metastatic disease. The tumor was staged at T1c, N0 MX. It was estrogen receptor positive, progesterone receptor positive. HER-2/neu was 2+. FISH was negative. All margins were free of tumor   06/02/13 Left anterior mediastinotomy [name] [name] procedure).   (if applicable) revealed:   FINDINGS: Multiple enlarged firm nodes. Frozen section revealed likely metastatic breast cancer.  Biopsy of mediastinal adenopathy 06/02/2013 shows invasive ductal carcinoma (gross cystic disease fluid protein positive, TTS-1 negative), estrogen receptor 80% positive, progesterone receptor 2% positive, HER-2 not amplified   Tobacco/Marijuana/Snuff/ETOH use: Never smoker, No alcohol, No Illicit Drug Use  Past/Anticipated interventions by cardiothoracic surgery, if any: Dr. Remo Lipps C. [name] Left Anterior Mediastinotomy 06/02/13  Past/Anticipated interventions by medical oncology, if any: Chemotherapy: Dr. [name]- letrozole started March 2015-- tolerated with significant side effects,  discontinued at the end of May 2015. Discontinued to to failure of therapy 03/30/14  Signs/Symptoms  Weight changes, if any:   Respiratory complaints, if any: Fatigue on walking  Hemoptysis, if any:   Pain issues, if any:   SAFETY ISSUES:  Prior radiation?  Adjuvant radiation given under [name] [name] in [facility] completed July 2006 and 45 Gy radiation to the right axillary and right supraclavicular nodal areas, with capecitabine sensitization, completed March 2012   Pacemaker/ICD?  Possible current pregnancy?  Is the patient on methotrexate?   Current Complaints / other details:   Intermittent sensation of choking.  Pressure in chest constantly-measures this discomfort as a level 2-3.  Pleurx catheter drainage on Tuesdays and Thursdays.  S/p Right lumpectomy and sentinel lymph node sampling 03/15/2004 for a pT1c pN0. Stage IA invasive ductal carcinoma, grade 2, estrogen receptor 95% positive, progesterone receptor 65% positive, HER-2 not amplified  Right upper extremity lymphedema

## 2014-04-06 ENCOUNTER — Ambulatory Visit
Admission: RE | Admit: 2014-04-06 | Discharge: 2014-04-06 | Disposition: A | Discharge status: Home / Self Care | Payer: 59 | Source: Ambulatory Visit | Attending: Radiation Oncology | Admitting: Radiation Oncology

## 2014-04-06 DIAGNOSIS — C50211 Malignant neoplasm of upper-inner quadrant of right female breast: Secondary | ICD-10-CM | POA: Insufficient documentation

## 2014-04-06 DIAGNOSIS — C50919 Malignant neoplasm of unspecified site of unspecified female breast: Secondary | ICD-10-CM

## 2014-04-06 NOTE — Progress Notes (Signed)
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 female who is seen today through the courtesy Dr. Jana Hakim for evaluation of her stage IV adenocarcinoma the right breast.  Her past medical history is well outlined by Dr. Gunnar Bulla Magrinat.  She was diagnosed in 2005 with T1 N0 invasive ductal carcinoma which was triple positive.  She underwent breast conserving surgery with Dr. Truitt Leep and adjuvant radiation therapy with Dr. Baruch Gouty in [place].  She completed radiation therapy in July 2006.  She declined antiestrogen therapy at that time.  She did well until the summer of 2011 when she developed a right axillary mass which was biopsied and found to represent metastatic breast cancer.  She underwent preoperative chemotherapy and then an axillary dissection in December 2011 at [facility].  I understand that 3 of 8 lymph nodes contained metastatic disease.  She received adjuvant radiation therapy to the right supraclavicular and axillary regions by Dr. Sandi Mealy at [facility], completing her therapy in March 2012.  In the fall 2012 she moved and was seen by Dr. Humphrey Rolls here at [facility] beginning in January 2013.  She was on adjuvant tamoxifen at that time.  In the fall of 2013 a CT scan showed recurrent disease within left chest and a follow-up CT and PET scans showed slow progression of disease from 2014 through 2015.  She was changed to Femara, and more recently everolimus after [facility] testing.  She has a left Pleurx catheter for drainage of her malignant effusion which does not appear to be progressing.   Her most recent PET scan on 03/29/2014 shows extensive abnormal soft tissue metastases in the left chest wall and almost circumferentially along the left pleura with hypermetabolic lymphadenopathy in the lower mediastinum region and adjacent to the left inferior and medial diaphragmatic crus and in the left periaortic space and the upper abdomen.  She takes Motrin when necessary discomfort.  Her only complaint is that of left upper parasternal's comfort and also discomfort along her left lower anterolateral chest wall adjacent to her catheter site.  She describes her pain as being 2-3/10.  She also describes a "pressure sensation" along her upper chest which is intermittent.  She denies dysphagia.  She has no dyspnea at rest.  PREVIOUS RADIATION THERAPY: History of radiation therapy in [facility] and also in [place] as discussed above.  We are requesting records.   PAST MEDICAL HISTORY:  has a past medical history of Seizures (2010); Breast cancer (dx'd 2005/2011); PONV (postoperative nausea and vomiting); and Peripheral vascular disease (02/2010).     PAST SURGICAL HISTORY:  Past Surgical History  Procedure Laterality Date  . Breast lumpectomy  2005  . Axillary lymph node dissection  Dec. 2011  . Portacath placement  12/11  . Removal portacath    . Mediastinotomy chamberlain mcneil Left 06/02/2013    Procedure: MEDIASTINOTOMY CHAMBERLAIN MCNEIL;  Surgeon: [name], MD;  Location: [place];  Service: Thoracic;  Laterality: Left;  LEFT ANTERIOR MEDIASTINOTOMY      FAMILY HISTORY: family  history is not on file.   SOCIAL HISTORY:  reports that she has never smoked. She has never used smokeless tobacco. She reports that she does not drink alcohol or use illicit drugs.   ALLERGIES: Decadron; Enoxaparin; Hydromorphone hcl; Morphine and related; and Tegaderm ag mesh   MEDICATIONS:  Current Outpatient Prescriptions  Medication Sig Dispense Refill  . b complex  vitamins capsule Take 1 capsule by mouth daily.    . Calcium-Magnesium-Vitamin D (CITRACAL CALCIUM+D PO) Take by mouth. 400mg  vitamin C, 500mg  vitamin D3 2 PO DAILY    . exemestane (AROMASIN) 25 MG tablet Take 1 tablet (25 mg total) by mouth daily after breakfast. 90 tablet 4  . folic acid (FOLVITE) 1 MG tablet Take 1 mg by mouth daily.    Marland Kitchen ibuprofen (ADVIL,MOTRIN) 200 MG tablet Take 200 mg by mouth every 4 (four) hours as needed.    . Melatonin 1 MG TABS Take 3 mg by mouth at bedtime as needed (sleep).     . palbociclib (IBRANCE) 125 MG capsule Take 125 mg by mouth.    Marland Kitchen VITAMIN D, CHOLECALCIFEROL, PO Take 2,000 Units by mouth daily.     Marland Kitchen ALPRAZolam (XANAX) 0.5 MG tablet Take 1 tablet (0.5 mg total) by mouth 2 (two) times daily as needed for anxiety. (Patient not taking: Reported on 04/06/2014) 30 tablet 0  . everolimus (AFINITOR) 10 MG tablet Take 1 tablet (10 mg total) by mouth daily. (Patient not taking: Reported on 04/06/2014) 30 tablet 4  . [DISCONTINUED] metoCLOPramide (REGLAN) 10 MG tablet Take 1 tablet (10 mg total) by mouth 4 (four) times daily -  before meals and at bedtime. 60 tablet 3   No current facility-administered medications for this encounter.     REVIEW OF SYSTEMS:  Pertinent items are noted in HPI.    PHYSICAL EXAM: Alert and oriented 54 year old white female appearing her stated age. Wt Readings from Last 3 Encounters:  09/08/13 171 lb 9.6 oz (77.837 kg)  08/26/13 172 lb 3.2 oz (78.109 kg)  08/19/13 171 lb 14.4 oz (77.973 kg)   Temp Readings from Last 3 Encounters:  03/08/14 97.7 F (36.5 C) Oral  09/12/13 98.3 F (36.8 C) Oral  09/08/13 98.3 F (36.8 C) Oral   BP Readings from Last 3 Encounters:  03/08/14 139/88  09/12/13 138/96  09/08/13 150/95   Pulse Readings from Last 3 Encounters:  03/08/14 81  09/12/13 126  09/08/13 121   She declines vital signs today.  Head and neck examination: Grossly unremarkable.  Nodes: Without palpable cervical,  supraclavicular, or axillary lymphadenopathy.  Chest: Decreased breath sounds along the left mid to lower lung zone.  Left lower anterolateral chest drain site.  There is slight discomfort with soft tissue swelling along the left upper parasternal region which is tender to palpation.  I do not feel a discrete mass.  Abdomen without hepatomegaly.  Extremities: Trace right upper extremity lymphedema.  Neurologic examination: Grossly nonfocal.    LABORATORY DATA:  Lab Results  Component Value Date   WBC 6.7 03/29/2014   HGB 14.0 03/29/2014   HCT 41.1 03/29/2014   MCV 94.1 03/29/2014   PLT 354 03/29/2014   Lab Results  Component Value Date   NA 140 03/29/2014   K 4.1 03/29/2014   CL 97 09/03/2013   CO2 27 03/29/2014   Lab Results  Component Value Date   ALT 82* 03/29/2014   AST 45* 03/29/2014   ALKPHOS 93 03/29/2014   BILITOT 0.42 03/29/2014  IMPRESSION: Stage IV metastatic adenocarcinoma breast primarily localized to the left chest cavity/mediastinal, and upper abdomen.  She is remarkably asymptomatic except for mild discomfort along her left upper parasternal region and left anterolateral lower chest wall.  We will obtain her outside radiation therapy records.  I told her that I would favor continuation of systemic therapy alone and reserve radiation therapy for more symptomatic disease.  We obviously want to avoid pulmonary toxicity from radiation therapy.  Areas of concern would be her left anterior parasternal disease which extends in the chest wall, and perhaps progression of her subcarinal adenopathy which may cause dysphagia.  We can be prepared to offer her palliative radiotherapy in the future should the need arise.  In the meantime we will obtain her radiation therapy records.   PLAN: As discussed above.  I spent 60  minutes face to face with the patient and more than 50% of that time was spent in counseling and/or coordination of care.

## 2014-04-06 NOTE — Addendum Note (Signed)
Encounter addended by: Deirdre Evener, RN on: 04/06/2014  6:16 PM<BR>     Documentation filed: Charges VN

## 2014-04-07 ENCOUNTER — Telehealth: Payer: Self-pay | Admitting: *Deleted

## 2014-04-07 ENCOUNTER — Ambulatory Visit: Payer: 59 | Admitting: Physical Therapy

## 2014-04-07 ENCOUNTER — Ambulatory Visit (HOSPITAL_BASED_OUTPATIENT_CLINIC_OR_DEPARTMENT_OTHER): Payer: 59 | Admitting: Oncology

## 2014-04-07 ENCOUNTER — Telehealth: Payer: Self-pay | Admitting: Oncology

## 2014-04-07 DIAGNOSIS — R7989 Other specified abnormal findings of blood chemistry: Secondary | ICD-10-CM

## 2014-04-07 DIAGNOSIS — C50912 Malignant neoplasm of unspecified site of left female breast: Secondary | ICD-10-CM

## 2014-04-07 DIAGNOSIS — C773 Secondary and unspecified malignant neoplasm of axilla and upper limb lymph nodes: Secondary | ICD-10-CM

## 2014-04-07 DIAGNOSIS — C50412 Malignant neoplasm of upper-outer quadrant of left female breast: Secondary | ICD-10-CM

## 2014-04-07 DIAGNOSIS — R0789 Other chest pain: Secondary | ICD-10-CM

## 2014-04-07 DIAGNOSIS — I890 Lymphedema, not elsewhere classified: Secondary | ICD-10-CM

## 2014-04-07 DIAGNOSIS — R945 Abnormal results of liver function studies: Secondary | ICD-10-CM

## 2014-04-07 DIAGNOSIS — M8580 Other specified disorders of bone density and structure, unspecified site: Secondary | ICD-10-CM

## 2014-04-07 DIAGNOSIS — Z86718 Personal history of other venous thrombosis and embolism: Secondary | ICD-10-CM

## 2014-04-07 DIAGNOSIS — J90 Pleural effusion, not elsewhere classified: Secondary | ICD-10-CM

## 2014-04-07 DIAGNOSIS — C50211 Malignant neoplasm of upper-inner quadrant of right female breast: Secondary | ICD-10-CM

## 2014-04-07 DIAGNOSIS — K760 Fatty (change of) liver, not elsewhere classified: Secondary | ICD-10-CM

## 2014-04-07 DIAGNOSIS — C782 Secondary malignant neoplasm of pleura: Secondary | ICD-10-CM

## 2014-04-07 NOTE — Telephone Encounter (Signed)
per pof to sch appt-gave pt copy of sch °

## 2014-04-07 NOTE — Progress Notes (Signed)
[place]  Telephone:([phone]) [phone] Fax:(336) [phone]     ID: [name] [name] OB: [birth year]  MR#: [id number]  TIR#:[id number]  PCP: Pcp Not In System GYN:  [name] [name] SU:  OTHER MD: [name] [name], [name] [name], [name] [name], [name] [name], [name] [name]  CHIEF COMPLAINT: Stage IV breast cancer CURRENT TREATMENT: exemestane, everolimus  BREAST CANCER HISTORY: From doctor [name] [name]'s intake note 03/20/2004:  "The patient is a very pleasant 54 year old female, without significant past medical history.  Her family history is significant for a sister who at age 27 was diagnosed with invasive ductal carcinoma.  She is a breast cancer survivor at age 20 now.  The patient states that she has never really had a screening mammogram until October 2005, when she felt that it was time for her to start having mammograms done on a yearly basis.  Therefore, on 01/26/04, she underwent a screening mammogram and an abnormality was detected in the upper outer right breast.  She, therefore, underwent spot compression views of both the right and the left breast.  The left breast revealed a well-defined mass in the upper outer left quadrant, present at the 2 o'clock position, measuring 1.8 cm, 6 cm from the nipple.  This, by ultrasound, was felt to be a simple cyst measuring 1.8 cm.  On the right breast, a spiculated mass was noted in the upper outer right quadrant.  The ultrasound revealed a shadowing irregular solid mass at the 10:30 position, 9 cm from the nipple, measuring 1.2 cm in greatest dimension, correlating with the spiculated mass seen on the mammogram.  The right axilla was negative ultrasonically.  Because of this, the patient underwent a needle biopsy of the right breast and the biopsy was positive invasive mammary carcinoma that showed features consistent with a high-grade invasive ductal carcinoma associated with desmoplastic stroma.  No in situ component was  seen and no definite lymphovascular invasion was identified.  On the core biopsy, the tumor measured about 0.8 cm.  Because of this, she was seen by [name]. [name] and the patient was taken to the [facility] on March 15, 2004.  She underwent a right breast lumpectomy with sentinel node biopsy.  The final pathology revealed an invasive ductal carcinoma, measuring 1.7 cm, grade 2 of 3.  Margins were free of tumor.  Atypical lobular hyperplasia was noted.  One sentinel node was removed which was negative for metastatic disease.  The tumor was staged at T1c, N0 MX.  It was estrogen receptor positive, progesterone receptor positive.  HER-2/neu was 2+.  FISH was negative.  All margins were free of tumor.  She is now seen in Medical Oncology for further evaluation and management of this newly diagnosed T1c, node negative, stage I, invasive ductal carcinoma of the right breast."  Her subsequent history is as detailed below  INTERVAL HISTORY: [name] [name] returns today for followup of her stage IV breast cancer accompanied by her husband [name] [name]. She started exemestane in 03/31/2014. She says she had a little bit more nausea, no vomiting. She has had some diarrhea, especially in the mornings, but she was having this prior to starting that drug. She was able to obtain the Afinitor at no cost (she had a ready met her to ductal late December). She has not started that yet. She has many concerns regarding her liver, cytopenias, and the multiple other possible side effects that she has been reading about associated with this drug. Also since her last visit  here she met with [name]. Valere [name]. He is going to be obtaining the old port records from her prior radiation in case there is further progression of her disease for example into the mediastinum affecting her ability to swallow, in which case he thinks palliative radiation might be useful. He does not see a role for radiation otherwise at this point.  REVIEW OF SYSTEMS: [name] [name]  continues to have her left pleural effusion drained on a weekly basis with stable volumes. They have not been intercurrent fevers, rash, or bleeding problems. Her chest swelling is stable. She is going through physical therapy several times a week. She is very anxious about the everolimus and wonders how often she should have lab work, when her next PET scan should be, how she should contact me if there is a problem and who to contact if I am out of town. She also stated she does not want to be seen by physician extenders but only by physicians. She is concerned that she burned her right palm while doing some cooking. She wonders if she will be able to leave the house at all while taking the everolimus. A detailed review of systems today was stable  PAST MEDICAL HISTORY: Past Medical History  Diagnosis Date  . Seizures 2010    Isolated incident.  . Breast cancer dx'd 2005/2011  . PONV (postoperative nausea and vomiting)   . Peripheral vascular disease 02/2010    blood clot related to porta cath    PAST SURGICAL HISTORY: Past Surgical History  Procedure Laterality Date  . Breast lumpectomy  2005  . Axillary lymph node dissection  Dec. 2011  . Portacath placement  12/11  . Removal portacath    . Mediastinotomy chamberlain mcneil Left 06/02/2013    Procedure: MEDIASTINOTOMY CHAMBERLAIN MCNEIL;  Surgeon: [name], MD;  Location: [facility];  Service: Thoracic;  Laterality: Left;  LEFT ANTERIOR MEDIASTINOTOMY     FAMILY HISTORY No family history on file. The patient's father is living, 88 years old as of may 2015. He lives in [place]. The patient's mother died from complications of COPD at the age of 57. These has 2 brothers, one sister. Her sister developed breast cancer at the age of 83. She is doing well. The patient herself underwent genetic testing at [facility] in 2011 and was found to be BRCA negative  GYNECOLOGIC HISTORY:  Menarche age 51, she is GX P0. She stopped having periods  with her initial chemotherapy in 2006.  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 Her husband [name] [name] is a Medical illustrator in [place]. He has a child from a prior marriage. At home they have 2 rescue dogs, Hobo and Cromwell. The patient is religious but not a church attender    ADVANCED DIRECTIVES: In place   HEALTH MAINTENANCE: History  Substance Use Topics  . Smoking status: Never Smoker   . Smokeless tobacco: Never Used  . Alcohol Use: No     Colonoscopy:  PAP:  Bone density: March 2015; mild osteopenia  Lipid panel:  Allergies  Allergen Reactions  . Decadron [Dexamethasone] Other (See Comments)    Patient does not tolerate steroids.   . Enoxaparin Other (See Comments)    unknown  . Hydromorphone Hcl Nausea And Vomiting  . Morphine And Related   . Tegaderm Ag Mesh [Silver]     Current Outpatient Prescriptions  Medication Sig Dispense Refill  .  ALPRAZolam (XANAX) 0.5 MG tablet Take 1 tablet (0.5 mg total) by mouth 2 (two) times daily as needed for anxiety. (Patient not taking: Reported on 04/06/2014) 30 tablet 0  . b complex vitamins capsule Take 1 capsule by mouth daily.    . Calcium-Magnesium-Vitamin D (CITRACAL CALCIUM+D PO) Take by mouth. 48m vitamin C, 5057mvitamin D3 2 PO DAILY    . everolimus (AFINITOR) 10 MG tablet Take 1 tablet (10 mg total) by mouth daily. (Patient not taking: Reported on 04/06/2014) 30 tablet 4  . exemestane (AROMASIN) 25 MG tablet Take 1 tablet (25 mg total) by mouth daily after breakfast. 90 tablet 4  . folic acid (FOLVITE) 1 MG tablet Take 1 mg by mouth daily.    . Marland Kitchenbuprofen (ADVIL,MOTRIN) 200 MG tablet Take 200 mg by mouth every 4 (four) hours as needed.    . Melatonin 1 MG TABS Take 3 mg by mouth at bedtime as needed (sleep).     . palbociclib (IBRANCE) 125 MG capsule Take 125 mg by mouth.    . Marland KitchenITAMIN D, CHOLECALCIFEROL, PO Take 2,000 Units by mouth daily.     .  [DISCONTINUED] metoCLOPramide (REGLAN) 10 MG tablet Take 1 tablet (10 mg total) by mouth 4 (four) times daily -  before meals and at bedtime. 60 tablet 3   No current facility-administered medications for this visit.    OBJECTIVE: Middle-aged white woman who appears stated age There were no vitals filed for this visit.   Body mass index is 0.00 kg/(m^2).   There were no vitals filed for this visit. Patient refused weight and vitals again today    ECOG FS:1 - Symptomatic but completely ambulatory  Physical exam was not repeated except for evaluation of the burn in the palm of the right hand. This is healing nicely with no evidence of infection.   LAB RESULTS:  CMP     Component Value Date/Time   NA 140 03/29/2014 1250   NA 134* 09/03/2013 2145   K 4.1 03/29/2014 1250   K 3.7 09/03/2013 2145   CL 97 09/03/2013 2145   CL 105 05/06/2012 1333   CO2 27 03/29/2014 1250   CO2 23 09/03/2013 2145   GLUCOSE 92 03/29/2014 1250   GLUCOSE 121* 09/03/2013 2145   GLUCOSE 124* 05/06/2012 1333   BUN 15.4 03/29/2014 1250   BUN 8 09/03/2013 2145   CREATININE 0.9 03/29/2014 1250   CREATININE 0.64 09/03/2013 2145   CALCIUM 10.1 03/29/2014 1250   CALCIUM 9.0 09/03/2013 2145   PROT 7.4 03/29/2014 1250   PROT 6.8 09/03/2013 2145   ALBUMIN 4.0 03/29/2014 1250   ALBUMIN 3.3* 09/03/2013 2145   AST 45* 03/29/2014 1250   AST 55* 09/03/2013 2145   ALT 82* 03/29/2014 1250   ALT 85* 09/03/2013 2145   ALKPHOS 93 03/29/2014 1250   ALKPHOS 114 09/03/2013 2145   BILITOT 0.42 03/29/2014 1250   BILITOT 0.3 09/03/2013 2145   GFRNONAA >90 09/03/2013 2145   GFRAA >90 09/03/2013 2145    No results found for: SPEP  Lab Results  Component Value Date   WBC 6.7 03/29/2014   NEUTROABS 4.3 03/29/2014   HGB 14.0 03/29/2014   HCT 41.1 03/29/2014   MCV 94.1 03/29/2014   PLT 354 03/29/2014      Chemistry      Component Value Date/Time   NA 140 03/29/2014 1250   NA 134* 09/03/2013 2145   K 4.1  03/29/2014 1250   K 3.7 09/03/2013 2145  CL 97 09/03/2013 2145   CL 105 05/06/2012 1333   CO2 27 03/29/2014 1250   CO2 23 09/03/2013 2145   BUN 15.4 03/29/2014 1250   BUN 8 09/03/2013 2145   CREATININE 0.9 03/29/2014 1250   CREATININE 0.64 09/03/2013 2145      Component Value Date/Time   CALCIUM 10.1 03/29/2014 1250   CALCIUM 9.0 09/03/2013 2145   ALKPHOS 93 03/29/2014 1250   ALKPHOS 114 09/03/2013 2145   AST 45* 03/29/2014 1250   AST 55* 09/03/2013 2145   ALT 82* 03/29/2014 1250   ALT 85* 09/03/2013 2145   BILITOT 0.42 03/29/2014 1250   BILITOT 0.3 09/03/2013 2145       Lab Results  Component Value Date   LABCA2 25 11/02/2007    No components found for: WLNLG921  No results for input(s): INR in the last 168 hours.  Urinalysis    Component Value Date/Time   COLORURINE YELLOW 09/03/2013 2109   APPEARANCEUR CLEAR 09/03/2013 2109   LABSPEC 1.005 09/12/2013 1542   LABSPEC 1.016 09/03/2013 2109   PHURINE 6.0 09/03/2013 2109   GLUCOSEU Negative 09/12/2013 [place] 09/03/2013 2109   HGBUR NEGATIVE 09/03/2013 2109   BILIRUBINUR NEGATIVE 09/03/2013 2109   KETONESUR NEGATIVE 09/03/2013 2109   PROTEINUR NEGATIVE 09/03/2013 2109   UROBILINOGEN 0.2 09/12/2013 1542   UROBILINOGEN 0.2 09/03/2013 2109   NITRITE NEGATIVE 09/03/2013 2109   LEUKOCYTESUR NEGATIVE 09/03/2013 2109    STUDIES: Nm Pet Image Restag (ps) Skull Base To Thigh  03/29/2014   CLINICAL DATA:  Subsequent treatment strategy for metastatic breast cancer.  EXAM: NUCLEAR MEDICINE PET SKULL BASE TO THIGH  TECHNIQUE: 9.2 mCi F-18 FDG was injected intravenously. Full-ring PET imaging was performed from the skull base to thigh after the radiotracer. CT data was obtained and used for attenuation correction and anatomic localization.  FASTING BLOOD GLUCOSE:  Value: 76 mg/dl  COMPARISON:  01/25/2014.  FINDINGS: NECK  No hypermetabolic lymph nodes in the neck.  CHEST  As before, there is markedly  hypermetabolic disease involving the posterior aspect of the upper left breast, anterior and medial left pleura inferiorly extensive involvement of the posterior and lateral left pleura. Hypermetabolic lymphadenopathy is identified in subcarinal lymph nodes.  An index nodule measured in the medial aspect of the upper left breast previously had SUV max = 3.2. This same hypermetabolic nodule has SUV max = 4.7 today.  The index soft tissue measured previously as a large prevascular nodal mass or pleural-based mass in the medial aspect of the upper left hemi thorax was 6.4 x 3.1 cm previously compared to 7.3 x 3.3 cm today. Increased SUV max = 14.3 for this region today compared to 8.2 previously. The 14 mm short axis hypermetabolic subcarinal lymph node seen on the previous study is now 18 mm in short axis. SUV max = 7.4 on today's study compared to 6.1 previously.  Stable moderate left pleural effusion with left pleural drain still visualized in-situ.  ABDOMEN/PELVIS  The previously characterized soft tissue nodularity along the medial aspect of the left hemi diaphragmatic crus measures 2.4 x 1.4 cm today compared to 2.3 x 1.5 cm previously. This hypermetabolic tissue has SUV max = 9.3 today compared to 7.0 previously.  Left para-aortic hypermetabolic lymph nodes are not substantially changed, with the more lateral of these 2 lymph nodes measuring 9 mm in short axis today compared 8 mm previously.  SKELETON  No focal hypermetabolic activity to suggest skeletal metastasis.  IMPRESSION:  As before, there is extensive abnormal soft tissue metastases in the left chest wall and almost circumferentially along the left pleura. There is associated hypermetabolic lymphadenopathy in the lower mediastinum, adjacent to the left inferior and medial diaphragmatic crus, and in the left para-aortic space the upper abdomen. Soft tissue measurements indicate continued slight progression of this disease and FDG accumulation within the  metastatic disease has increased at all measured sites.   Electronically Signed   By: [name] [name] M.D.   On: 03/29/2014 16:16    ASSESSMENT: 54 y.o. [place] woman with stage IV breast cancer, history as follows  (1)  S/p Right lumpectomy and sentinel lymph node sampling 03/15/2004 for a pT1c pN0. Stage IA invasive ductal carcinoma, grade 2, estrogen receptor 95% positive, progesterone receptor 65% positive, HER-2 not amplified; additional surgery 04/25/2004 for seroma or clearance showed no residual tumor  (2) adjuvant chemotherapy with cyclophosphamide and doxorubicin every 21 days x4 completed 07/19/2004  (3) adjuvant radiation given under [name]. Donella [name] in [place] completed July 2006  (4) the patient opted against adjuvant antiestrogen therapy  (5) genetics testing showed no BRCA mutations  (6) biopsy of a palpable right axillary mass 10/24/2009 showed invasive ductal carcinoma, grade 3, estrogen receptor 100% positive, progesterone receptor 2% positive (alert score 5) HER-2 negative; no evidence of systemic disease on PET scanning  (7) completed 3 of 4 planned cycles of docetaxel and cyclophosphamide September 2011, fourth cycle omitted because of marked elevations in liver function tests  (8) an right axillary lymph node dissection 03/06/2010 showed 3/8 lymph nodes removed to be involved by tumor, with extracapsular extension.  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, TTS-1 negative), estrogen receptor 80% positive, progesterone receptor 2% positive, HER-2 not amplified  (12)  letrozole started March 2015-- tolerated with significant side effects, discontinued at the end of May 2015  (13) PET scan 08/16/2013 shows extensive left pleural metastatic disease and a large left pleural effusion that shifts cardiac and mediastinal structures to the right; adenopathy (celiac trunk, periadrenal, periaortic); and a left medial clavicular lesion; Status post left thoracentesis 08/16/2013 positive for adenocarcinoma, estrogen receptor positive, progesterone receptor negative.  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[place], a complex rearrangement in PIK3R2, and amplification ofPIK3C2B]],  amplification of MCL1 and MDM4, anda MAP2K4 R287H mutation; everolimus was suggested as an available targeted agent  (18) exemestane started 03/31/2014 with everolimus added 04/03/2014    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 She had many questions and concerns regarding this agent and we addressed those. She considered participating in  a study at [facility] but at this point wants to keep that "in her back pocket". She asked about "most aggressive therapy" and she has not had carboplatin/ gemcitabine, which is what I would suggest if she wanted to move in that direction. She is very concerned about her history of liver inflammation with chemotherapy and also by the fact that I will be out of town Jan 19-Feb 4.  After much discussion, she agreed to start everolimus today. She will have weekly lab work to check for cytopenias and LFT elevation. Since she has some LFT changes at baseline we clarified what we are looking for is either a steady trend upwards or a large jump--in other words we are not going to interrupt the everolimus for mild to moderate LFT changes. If there is a steady and significant upwards trend or a large jump we will hold the everolimus until resolution then resume at a lower dose.  She would like to meet my partner [name] [name] who will be my backup while I am out of town. She will drop by 04/07/2014 after lab check to meet him. We will also locate her radiology discs (which she brought to [name] [name]).  We are going to repeat a PET scan the first week in March. Adalin will contact [facility] around that time to review possible study options in case we document progression.    Talesha is very anxious but she is proceeding with treatment and all this structuring is helpful to her. She has a good understanding of the overall plan. She agrees with it. She knows the goal of treatment in her case is control. She will call with any problems that may develop before her next visit here.     Chauncey Cruel, MD   04/07/2014 3:39 PM

## 2014-04-07 NOTE — Therapy (Signed)
[place], [place], [address] Phone: [phone]   Fax:  [phone]  Physical Therapy Treatment  Patient Details  Name: [name] [name] MRN: [id number] Date of Birth: [birth year] Referring Provider:  Deatra Robinson, MD  Encounter Date: 04/07/2014      PT End of Session - 04/07/14 1252    Visit Number 3   Number of Visits 25   Date for PT Re-Evaluation 06/16/14   PT Start Time 1105   PT Stop Time 1145   PT Time Calculation (min) 40 min      Past Medical History  Diagnosis Date  . Seizures 2010    Isolated incident.  . Breast cancer dx'd 2005/2011  . PONV (postoperative nausea and vomiting)   . Peripheral vascular disease 02/2010    blood clot related to porta cath    Past Surgical History  Procedure Laterality Date  . Breast lumpectomy  2005  . Axillary lymph node dissection  Dec. 2011  . Portacath placement  12/11  . Removal portacath    . Mediastinotomy chamberlain mcneil Left 06/02/2013    Procedure: MEDIASTINOTOMY CHAMBERLAIN MCNEIL;  Surgeon: [name], MD;  Location: [facility];  Service: Thoracic;  Laterality: Left;  LEFT ANTERIOR MEDIASTINOTOMY     There were no vitals taken for this visit.  Visit Diagnosis:  Lymphedema  Other chest pain      Subjective Assessment - 04/07/14 1249    Symptoms Had a good meeting with Dr. Valere Dross (radiation oncologist) yesterday.     Currently in Pain? --  no reports of pain today                    [facility] Adult PT Treatment/Exercise - 04/07/14 0001    Manual Therapy   Edema Management Manual lymph drainage as on 04/03/14.   Massage As on 04/03/14.   Myofascial Release As on 04/03/14.                        [facility] Goals - 04/07/14 1253    CC Long Term Goal  #1   Status On-going   CC Long Term Goal  #2   Status On-going            Plan - 04/07/14 1252    Clinical Impression  Statement Patient's right axilla and right upper/outer breast again less swollen on palpation today--less induration.   Pt will benefit from skilled therapeutic intervention in order to improve on the following deficits Increased edema;Pain   Rehab Potential Good   Clinical Impairments Affecting Rehab Potential active cancer, with recent evidence of spread   PT Frequency 2x / week   PT Duration 12 weeks   PT Treatment/Interventions Manual lymph drainage;Manual techniques;Scar mobilization   PT Next Visit Plan Manual lymph drainage, myofascial release, soft tissue work.   Consulted and Agree with Plan of Care Patient   PT Plan Continue manual lymph drainage, soft tissue work, and myofascial release for pain and edema control.        Problem List Patient Active Problem List   Diagnosis Date Noted  . Chest wall pain 03/21/2014  . Abnormal LFTs (liver function tests) 09/12/2013  . Pleural effusion 08/18/2013  . 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 Breast cancer metastasized to multiple sites 08/18/2013    [name] [name] 04/07/2014, 12:54 PM  [facility] [id number]  [place], [place], [address] Phone: [phone]   Fax:  [phone]  [name] [name], [place]

## 2014-04-07 NOTE — Telephone Encounter (Signed)
Received email from Dr. Jana Hakim stating the pt is scheduled to see him today and next Friday.  He does not need to see her both dates.  Called and left a message for the pt to make her aware that we are cancelling the 1/15 appt and for her to get another print out of her schedule today when she leaves.  Told her if she had any questions, to call me.

## 2014-04-08 ENCOUNTER — Ambulatory Visit: Payer: 59 | Admitting: Radiation Oncology

## 2014-04-10 ENCOUNTER — Ambulatory Visit: Payer: 59 | Admitting: Physical Therapy

## 2014-04-10 DIAGNOSIS — I890 Lymphedema, not elsewhere classified: Secondary | ICD-10-CM | POA: Diagnosis not present

## 2014-04-10 DIAGNOSIS — R0789 Other chest pain: Secondary | ICD-10-CM

## 2014-04-10 NOTE — Therapy (Signed)
[place], [place], [address] Phone: [phone]   Fax:  [phone]  Physical Therapy Treatment  Patient Details  Name: [name] [name] MRN: [id number] Date of Birth: Jul 07, 1960 Referring Provider:  Deatra Robinson, MD  Encounter Date: 04/10/2014      PT End of Session - 04/10/14 1226    Visit Number 4   Number of Visits 25   Date for PT Re-Evaluation 06/16/14   PT Start Time 6962   PT Stop Time 1102   PT Time Calculation (min) 47 min      Past Medical History  Diagnosis Date  . Seizures 2010    Isolated incident.  . Breast cancer dx'd 2005/2011  . PONV (postoperative nausea and vomiting)   . Peripheral vascular disease 02/2010    blood clot related to porta cath    Past Surgical History  Procedure Laterality Date  . Breast lumpectomy  2005  . Axillary lymph node dissection  Dec. 2011  . Portacath placement  12/11  . Removal portacath    . Mediastinotomy chamberlain mcneil Left 06/02/2013    Procedure: MEDIASTINOTOMY CHAMBERLAIN MCNEIL;  Surgeon: [name], MD;  Location: [facility];  Service: Thoracic;  Laterality: Left;  LEFT ANTERIOR MEDIASTINOTOMY     There were no vitals taken for this visit.  Visit Diagnosis:  Lymphedema  Other chest pain      Subjective Assessment - 04/10/14 1222    Symptoms Started afinator on Saturday; trying to adjust to that.  Fatigue is a problem.   Currently in Pain? --  no complaints except when right trap spasmed                    [facility] Adult PT Treatment/Exercise - 04/10/14 0001    Manual Therapy   Edema Management Manual lymph drainage in left sidelying:  posterior interaxillary anastomosis right to left and right axillo-inguinal anastomosis.  In supine, short neck, left axilla and anterior interaxillary anastomosis, right groin and axillo-inguinal anastomosis, area around breast scars directing toward pathways, and  right upper arm.  In right sidelying, left periscapular area toward left inguinal nodes.   Massage In right sidelying, to left upper back for pain relief and muscle relaxation.   Myofascial Release Soft tissue mobilization, prolonged release at left axilla incision.                        [facility] Goals - 04/07/14 1253    CC Long Term Goal  #1   Status On-going   CC Long Term Goal  #2   Status On-going            Plan - 04/10/14 1227    Clinical Impression Statement Again today, right axilla and lateral breast were less full to palpation today.  Left upper back also was less "knotty" to palpation.   PT Next Visit Plan Manual lymph drainage, myofascial release, soft tissue work.        Problem List Patient Active Problem List   Diagnosis Date Noted  . Chest wall pain 03/21/2014  . Abnormal LFTs (liver function tests) 09/12/2013  . Pleural effusion 08/18/2013  . 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 Breast cancer metastasized to multiple sites 08/18/2013    [facility] 04/10/2014, 12:29 PM  [place], [place], [address] Phone: [phone]   Fax:  [place], [place]

## 2014-04-12 ENCOUNTER — Other Ambulatory Visit (HOSPITAL_BASED_OUTPATIENT_CLINIC_OR_DEPARTMENT_OTHER): Payer: 59

## 2014-04-12 ENCOUNTER — Other Ambulatory Visit: Payer: Self-pay | Admitting: *Deleted

## 2014-04-12 DIAGNOSIS — R7989 Other specified abnormal findings of blood chemistry: Secondary | ICD-10-CM

## 2014-04-12 DIAGNOSIS — R945 Abnormal results of liver function studies: Secondary | ICD-10-CM

## 2014-04-12 DIAGNOSIS — C50211 Malignant neoplasm of upper-inner quadrant of right female breast: Secondary | ICD-10-CM

## 2014-04-12 DIAGNOSIS — C50412 Malignant neoplasm of upper-outer quadrant of left female breast: Secondary | ICD-10-CM

## 2014-04-12 LAB — CBC WITH DIFFERENTIAL/PLATELET
BASO%: 0.3 % (ref 0.0–2.0)
BASOS ABS: 0 10*3/uL (ref 0.0–0.1)
EOS ABS: 0.2 10*3/uL (ref 0.0–0.5)
EOS%: 3.3 % (ref 0.0–7.0)
HCT: 42.2 % (ref 34.8–46.6)
HGB: 14.4 g/dL (ref 11.6–15.9)
LYMPH%: 23.2 % (ref 14.0–49.7)
MCH: 32.5 pg (ref 25.1–34.0)
MCHC: 34.1 g/dL (ref 31.5–36.0)
MCV: 95.3 fL (ref 79.5–101.0)
MONO#: 0.4 10*3/uL (ref 0.1–0.9)
MONO%: 6 % (ref 0.0–14.0)
NEUT#: 4.5 10*3/uL (ref 1.5–6.5)
NEUT%: 67.2 % (ref 38.4–76.8)
Platelets: 325 10*3/uL (ref 145–400)
RBC: 4.43 10*6/uL (ref 3.70–5.45)
RDW: 12.4 % (ref 11.2–14.5)
WBC: 6.7 10*3/uL (ref 3.9–10.3)
lymph#: 1.5 10*3/uL (ref 0.9–3.3)

## 2014-04-12 LAB — COMPREHENSIVE METABOLIC PANEL (CC13)
ALBUMIN: 4.1 g/dL (ref 3.5–5.0)
ALK PHOS: 99 U/L (ref 40–150)
ALT: 87 U/L — ABNORMAL HIGH (ref 0–55)
AST: 52 U/L — AB (ref 5–34)
Anion Gap: 9 mEq/L (ref 3–11)
BUN: 18.2 mg/dL (ref 7.0–26.0)
CHLORIDE: 101 meq/L (ref 98–109)
CO2: 27 mEq/L (ref 22–29)
Calcium: 9.2 mg/dL (ref 8.4–10.4)
Creatinine: 0.9 mg/dL (ref 0.6–1.1)
EGFR: 72 mL/min/{1.73_m2} — ABNORMAL LOW (ref >90)
GLUCOSE: 62 mg/dL — AB (ref 70–140)
Potassium: 3.7 mEq/L (ref 3.5–5.1)
SODIUM: 138 meq/L (ref 136–145)
TOTAL PROTEIN: 7.8 g/dL (ref 6.4–8.3)
Total Bilirubin: 0.38 mg/dL (ref 0.20–1.20)

## 2014-04-12 NOTE — Progress Notes (Signed)
Pt requested to come in for lab check and " if needed to see the MD ".  Per phone discussion [name] [name] states since starting the everolimus she has had ongoing symptoms with mixed responses - some improved such as the fatique- but then increased nose bleeds and migraines are worse.  [name] [name] states she developed a mouth sore pm yesterday with a sore throat.  Plan per discussion is for [name] [name] to come in today for lab check with STAT [facility] and cbc. Informed [name] [name] that MD is booked today including having 5 new patients- this RN can review labs with her and have MD look at them but she probably will not be able to speak with him for her questions.  [name] [name] verbalized understanding- appointment time made.

## 2014-04-13 ENCOUNTER — Other Ambulatory Visit: Payer: Self-pay | Admitting: Radiation Oncology

## 2014-04-13 ENCOUNTER — Ambulatory Visit
Admission: RE | Admit: 2014-04-13 | Discharge: 2014-04-13 | Disposition: A | Discharge status: Home / Self Care | Payer: Self-pay | Source: Ambulatory Visit | Attending: Radiation Oncology | Admitting: Radiation Oncology

## 2014-04-13 ENCOUNTER — Other Ambulatory Visit: Payer: Self-pay | Admitting: *Deleted

## 2014-04-13 DIAGNOSIS — C50919 Malignant neoplasm of unspecified site of unspecified female breast: Secondary | ICD-10-CM

## 2014-04-13 MED ORDER — EVEROLIMUS 2.5 MG PO TABS: 2.5000 mg | ORAL_TABLET | Freq: Every day | ORAL | Status: DC

## 2014-04-13 NOTE — Telephone Encounter (Signed)
No entry 

## 2014-04-14 ENCOUNTER — Ambulatory Visit: Payer: Self-pay | Admitting: Oncology

## 2014-04-14 ENCOUNTER — Other Ambulatory Visit: Payer: Self-pay | Admitting: *Deleted

## 2014-04-14 ENCOUNTER — Ambulatory Visit: Payer: 59 | Admitting: Physical Therapy

## 2014-04-14 ENCOUNTER — Other Ambulatory Visit: Payer: Self-pay

## 2014-04-14 DIAGNOSIS — R0789 Other chest pain: Secondary | ICD-10-CM

## 2014-04-14 DIAGNOSIS — I890 Lymphedema, not elsewhere classified: Secondary | ICD-10-CM

## 2014-04-14 MED ORDER — FIRST-DUKES MOUTHWASH MT SUSP: 10.0000 mL | Freq: Four times a day (QID) | OROMUCOSAL | Status: DC | PRN

## 2014-04-14 NOTE — Telephone Encounter (Signed)
This RN spoke with pt per her call regarding the one mouth sore that is not healing well " even after not taking the afinitor for 2 days "  Pt is using mouth kote which she obtained from her dentist with some relief.  Keirah also states concern for ongoing nose bleeds " though not like a dripping nose bleed it is continuing "  This RN discussed use of home or OTC remedies including keeping air in home moist and use of coconut oil for relief and moisture for mouth and nose.  Per discussion - MMW prescription obtained and sent to pt's pharmacy.  No other needs at this time.

## 2014-04-14 NOTE — Therapy (Signed)
[name], [place], [address] Phone: [phone]   Fax:  [phone]  Physical Therapy Treatment  Patient Details  Name: [name] [name] MRN: [id number] Date of Birth: 08-07-1960 Referring Provider:  Deatra Robinson, MD  Encounter Date: 04/14/2014      PT End of Session - 04/14/14 1313    Visit Number 5   Number of Visits 25   Date for PT Re-Evaluation 06/16/14   PT Start Time 1115   PT Stop Time 1159   PT Time Calculation (min) 44 min      Past Medical History  Diagnosis Date  . Seizures 2010    Isolated incident.  . Breast cancer dx'd 2005/2011  . PONV (postoperative nausea and vomiting)   . Peripheral vascular disease 02/2010    blood clot related to porta cath    Past Surgical History  Procedure Laterality Date  . Breast lumpectomy  2005  . Axillary lymph node dissection  Dec. 2011  . Portacath placement  12/11  . Removal portacath    . Mediastinotomy chamberlain mcneil Left 06/02/2013    Procedure: MEDIASTINOTOMY CHAMBERLAIN MCNEIL;  Surgeon: [name], MD;  Location: [facility];  Service: Thoracic;  Laterality: Left;  LEFT ANTERIOR MEDIASTINOTOMY     There were no vitals taken for this visit.  Visit Diagnosis:  Lymphedema  Other chest pain      Subjective Assessment - 04/14/14 1309    Symptoms Having difficulty since starting new medication with mouth sore and therefore eating.  Called Dr. Jana Hakim and requested a reduction in dose, which he did.  Hasn't started the new dose yet.   Currently in Pain? Other (Comment)  not reported                    [facility] Adult PT Treatment/Exercise - 04/14/14 0001    Manual Therapy   Edema Management Manual lymph drainage in left sidelying: posterior interaxillary anastomosis right to left and right axillo-inguinal anastomosis; in supine, short neck, left axilla and anterior interaxillary anastomosis, right groin  and axillo-inguinal anastomosis, and area around right lateral breast incisions, as well as right upper arm; in right sidelying, from left periscapular area to left groing.   Massage In right sidelying, to left upper back and central back and neck.   Myofascial Release Soft tissue mobilization, release at right axilla.                        [facility] Goals - 04/07/14 1253    CC Long Term Goal  #1   Status On-going   CC Long Term Goal  #2   Status On-going            Plan - 04/14/14 1313    Clinical Impression Statement Right lateral breast area between incisions was firmer today; left upper back felt smooth.   Pt will benefit from skilled therapeutic intervention in order to improve on the following deficits Increased edema;Pain   Rehab Potential Good   Clinical Impairments Affecting Rehab Potential active cancer, with recent evidence of spread   PT Frequency 2x / week   PT Duration 12 weeks   PT Treatment/Interventions Manual lymph drainage;Manual techniques;Scar mobilization   PT Next Visit Plan Manual lymph drainage, myofascial release, soft tissue work.   Consulted and Agree with Plan of Care Patient   PT Plan Continue manual lymph drainage, soft tissue work, and myofascial  release for pain and edema control.        Problem List Patient Active Problem List   Diagnosis Date Noted  . Chest wall pain 03/21/2014  . Abnormal LFTs (liver function tests) 09/12/2013  . Pleural effusion 08/18/2013  . 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 Breast cancer metastasized to multiple sites 08/18/2013    [facility] 04/14/2014, 1:15 PM  [place], [place], [address] Phone: [phone]   Fax:  [phone]  [name], [place]

## 2014-04-17 ENCOUNTER — Ambulatory Visit: Payer: 59 | Admitting: Physical Therapy

## 2014-04-17 ENCOUNTER — Other Ambulatory Visit: Payer: Self-pay | Admitting: Oncology

## 2014-04-17 ENCOUNTER — Other Ambulatory Visit (HOSPITAL_BASED_OUTPATIENT_CLINIC_OR_DEPARTMENT_OTHER): Payer: 59

## 2014-04-17 ENCOUNTER — Other Ambulatory Visit: Payer: Self-pay | Admitting: *Deleted

## 2014-04-17 DIAGNOSIS — C50412 Malignant neoplasm of upper-outer quadrant of left female breast: Secondary | ICD-10-CM

## 2014-04-17 DIAGNOSIS — R0789 Other chest pain: Secondary | ICD-10-CM

## 2014-04-17 DIAGNOSIS — C773 Secondary and unspecified malignant neoplasm of axilla and upper limb lymph nodes: Secondary | ICD-10-CM

## 2014-04-17 DIAGNOSIS — C50211 Malignant neoplasm of upper-inner quadrant of right female breast: Secondary | ICD-10-CM

## 2014-04-17 DIAGNOSIS — I890 Lymphedema, not elsewhere classified: Secondary | ICD-10-CM | POA: Diagnosis not present

## 2014-04-17 DIAGNOSIS — C50919 Malignant neoplasm of unspecified site of unspecified female breast: Secondary | ICD-10-CM

## 2014-04-17 DIAGNOSIS — C782 Secondary malignant neoplasm of pleura: Secondary | ICD-10-CM

## 2014-04-17 DIAGNOSIS — R7989 Other specified abnormal findings of blood chemistry: Secondary | ICD-10-CM

## 2014-04-17 DIAGNOSIS — J90 Pleural effusion, not elsewhere classified: Secondary | ICD-10-CM

## 2014-04-17 DIAGNOSIS — R945 Abnormal results of liver function studies: Secondary | ICD-10-CM

## 2014-04-17 LAB — COMPREHENSIVE METABOLIC PANEL (CC13)
ALBUMIN: 3.7 g/dL (ref 3.5–5.0)
ALT: 131 U/L — AB (ref 0–55)
AST: 81 U/L — AB (ref 5–34)
Alkaline Phosphatase: 100 U/L (ref 40–150)
Anion Gap: 10 mEq/L (ref 3–11)
BUN: 11.2 mg/dL (ref 7.0–26.0)
CALCIUM: 8.8 mg/dL (ref 8.4–10.4)
CO2: 27 meq/L (ref 22–29)
Chloride: 105 mEq/L (ref 98–109)
Creatinine: 0.8 mg/dL (ref 0.6–1.1)
EGFR: 89 mL/min/{1.73_m2} — ABNORMAL LOW (ref >90)
GLUCOSE: 75 mg/dL (ref 70–140)
Potassium: 3.9 mEq/L (ref 3.5–5.1)
Sodium: 142 mEq/L (ref 136–145)
TOTAL PROTEIN: 7 g/dL (ref 6.4–8.3)
Total Bilirubin: 0.25 mg/dL (ref 0.20–1.20)

## 2014-04-17 LAB — CBC WITH DIFFERENTIAL/PLATELET
BASO%: 0.4 % (ref 0.0–2.0)
Basophils Absolute: 0 10*3/uL (ref 0.0–0.1)
EOS%: 3 % (ref 0.0–7.0)
Eosinophils Absolute: 0.2 10*3/uL (ref 0.0–0.5)
HEMATOCRIT: 39.6 % (ref 34.8–46.6)
HEMOGLOBIN: 13.6 g/dL (ref 11.6–15.9)
LYMPH#: 1.2 10*3/uL (ref 0.9–3.3)
LYMPH%: 24.2 % (ref 14.0–49.7)
MCH: 32.2 pg (ref 25.1–34.0)
MCHC: 34.3 g/dL (ref 31.5–36.0)
MCV: 93.8 fL (ref 79.5–101.0)
MONO#: 0.4 10*3/uL (ref 0.1–0.9)
MONO%: 8.8 % (ref 0.0–14.0)
NEUT%: 63.6 % (ref 38.4–76.8)
NEUTROS ABS: 3.2 10*3/uL (ref 1.5–6.5)
PLATELETS: 216 10*3/uL (ref 145–400)
RBC: 4.22 10*6/uL (ref 3.70–5.45)
RDW: 12.1 % (ref 11.2–14.5)
WBC: 5 10*3/uL (ref 3.9–10.3)

## 2014-04-17 NOTE — Therapy (Signed)
[place], [place], [address] Phone: [phone]   Fax:  [phone]  Physical Therapy Treatment  Patient Details  Name: [name] [name] MRN: [id number] Date of Birth: [birth year] Referring Provider:  Deatra Robinson, MD  Encounter Date: 04/17/2014      PT End of Session - 04/17/14 1317    Visit Number 6   Number of Visits 25   Date for PT Re-Evaluation 06/16/14   PT Start Time 1020   PT Stop Time 1100   PT Time Calculation (min) 40 min      Past Medical History  Diagnosis Date  . Seizures 2010    Isolated incident.  . Breast cancer dx'd 2005/2011  . PONV (postoperative nausea and vomiting)   . Peripheral vascular disease 02/2010    blood clot related to porta cath    Past Surgical History  Procedure Laterality Date  . Breast lumpectomy  2005  . Axillary lymph node dissection  Dec. 2011  . Portacath placement  12/11  . Removal portacath    . Mediastinotomy chamberlain mcneil Left 06/02/2013    Procedure: MEDIASTINOTOMY CHAMBERLAIN MCNEIL;  Surgeon: [name], MD;  Location: [facility];  Service: Thoracic;  Laterality: Left;  LEFT ANTERIOR MEDIASTINOTOMY     There were no vitals taken for this visit.  Visit Diagnosis:  Lymphedema  Other chest pain      Subjective Assessment - 04/17/14 1024    Symptoms Bent over to pet the dog and had a pain at left chest.   Currently in Pain? Yes   Pain Score 6    Pain Location Chest   Pain Orientation Left   Pain Descriptors / Indicators Pressure;Other (Comment)  hard to take a deep breath   Pain Type Acute pain   Pain Onset Today   Pain Relieving Factors lying down a little better                    [facility] Adult PT Treatment/Exercise - 04/17/14 0001    Manual Therapy   Edema Management manual lymph drainage:  in left sidelying, posterior interaxillary anastomosis right to left and right axillo-inguinal anastomosis;  in supine, short neck, left axilla and anterior interaxillary anastomosis, right groin and axillo-inguinal anastomosis, and area between two incisions, directing toward pathways; in right sidelying, left upper back toward left groin.   Massage In sitting, to upper back right and left, due to patient's c/o right upper back trigger point today.   Myofascial Release Soft tissue mobilization and myofascial release at right axillary scar (brief).                        [facility] Goals - 04/07/14 1253    CC Long Term Goal  #1   Status On-going   CC Long Term Goal  #2   Status On-going            Plan - 04/17/14 1317    Clinical Impression Statement Pt. with new onset of chest pain that was brought on by bending to pet her dog; pt. was to go to the [facility] after appointment today and planned to ask about this.  Some relief of right upper back pain with soft tissue work today.  Area between two breast scars felt softer than in the last couple weeks at beginning of treatment today.   PT Next Visit Plan Manual lymph drainage, myofascial release,  soft tissue work.   Recommended Other Services Will ask at [facility] today about newer chest pain.        Problem List Patient Active Problem List   Diagnosis Date Noted  . Chest wall pain 03/21/2014  . Abnormal LFTs (liver function tests) 09/12/2013  . Pleural effusion 08/18/2013  . 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 Breast cancer metastasized to multiple sites 08/18/2013    [facility] 04/17/2014, 1:20 PM  [place], [place], [address] Phone: [phone]   Fax:  [phone]  [name], [place]

## 2014-04-18 ENCOUNTER — Other Ambulatory Visit: Payer: Self-pay | Admitting: *Deleted

## 2014-04-18 ENCOUNTER — Telehealth: Payer: Self-pay | Admitting: Oncology

## 2014-04-18 ENCOUNTER — Telehealth: Payer: Self-pay | Admitting: *Deleted

## 2014-04-18 MED ORDER — FLUCONAZOLE 100 MG PO TABS: 100.0000 mg | ORAL_TABLET | Freq: Every day | ORAL | Status: DC

## 2014-04-18 NOTE — Telephone Encounter (Signed)
, °

## 2014-04-18 NOTE — Telephone Encounter (Signed)
Pt called and stated she needs to change schedule post thinking further about how she wants to proceed with therapy.  She states she is holding the afinitor at present " to allow my liver counts to either decrease or stabilization, then when I restart the 2.5mg  we can see how much it that dose affects my liver "  Appointments will be corrected.  Of note pt states " sore in my mouth seems to not be getting better but maybe even worse"  Per inquiry- Aniaya states there is a " white coating over the area but that seems to be only where it is"  Estefania is presently doing MMW ( [facility] formula ) and salt water rinses.  This RN recommended for her to hold the salt water rinses and she may need a prescription for diflucan for possible yeast/thrush.

## 2014-04-19 ENCOUNTER — Other Ambulatory Visit: Payer: Self-pay | Admitting: *Deleted

## 2014-04-19 ENCOUNTER — Encounter: Payer: Self-pay | Admitting: *Deleted

## 2014-04-19 DIAGNOSIS — C50919 Malignant neoplasm of unspecified site of unspecified female breast: Secondary | ICD-10-CM

## 2014-04-19 NOTE — Progress Notes (Signed)
This RN spoke with pt per her request in the office.  Wrenly wanted RN to check mouth due to ongoing " canker sore ".  Noted small round white covered sore on upper inner gum on left side.  Also noted some scattering of white bumps on back of gums and tongue. Luwanda states " tongue feels thick like it has a coating on it ".  Pt has taken one dose of difucan pm 04/18/2014.  Yaslene will continue on medication and call on Friday if mouth is not improving.  Ladina also stated concern due issues on being on afinitor with side effects appearing " so quickly " meaning within the first week of therapy.  Eknoor would like to see Dr [name] for possible surgical debulking " that may benefit me since radiation is not an option at this time "  Pt would also like to see Dr [name] again to discuss other therapies that may be available vs conventional therapy.Pt has been seen by Dr [name] previously.  This RN placed referral for above per pt  Request.

## 2014-04-20 ENCOUNTER — Other Ambulatory Visit: Payer: Self-pay

## 2014-04-20 ENCOUNTER — Ambulatory Visit: Payer: 59 | Admitting: Physical Therapy

## 2014-04-20 DIAGNOSIS — I890 Lymphedema, not elsewhere classified: Secondary | ICD-10-CM

## 2014-04-20 DIAGNOSIS — M6289 Other specified disorders of muscle: Secondary | ICD-10-CM

## 2014-04-20 DIAGNOSIS — R0789 Other chest pain: Secondary | ICD-10-CM

## 2014-04-20 NOTE — Therapy (Signed)
[place], [place], [address] Phone: [phone]   Fax:  [phone]  Physical Therapy Treatment  Patient Details  Name: [name] [name] MRN: [id number] Date of Birth: 03-13-1961 Referring Provider:  Deatra Robinson, MD  Encounter Date: 04/20/2014      PT End of Session - 04/20/14 1120    Visit Number 7   Number of Visits 25   Date for PT Re-Evaluation 06/16/14   PT Start Time 1020   PT Stop Time 1107   PT Time Calculation (min) 47 min      Past Medical History  Diagnosis Date  . Seizures 2010    Isolated incident.  . Breast cancer dx'd 2005/2011  . PONV (postoperative nausea and vomiting)   . Peripheral vascular disease 02/2010    blood clot related to porta cath    Past Surgical History  Procedure Laterality Date  . Breast lumpectomy  2005  . Axillary lymph node dissection  Dec. 2011  . Portacath placement  12/11  . Removal portacath    . Mediastinotomy chamberlain mcneil Left 06/02/2013    Procedure: MEDIASTINOTOMY CHAMBERLAIN MCNEIL;  Surgeon: [name], MD;  Location: [facility];  Service: Thoracic;  Laterality: Left;  LEFT ANTERIOR MEDIASTINOTOMY     There were no vitals taken for this visit.  Visit Diagnosis:  Lymphedema  Other chest pain  Muscle stiffness      Subjective Assessment - 04/20/14 1116    Symptoms Has thrush now; has started medication to treat that.  Says she couldn't talk yesterday.  Still feeling fullness at just distal and posterior to right axilla.   Currently in Pain? Yes   Pain Score --  not rated today                    [facility] Adult PT Treatment/Exercise - 04/20/14 0001    Manual Therapy   Edema Management Manual lymph drainage as on 04/17/14.   Massage In sitting, to upper back, with focus on right upper trap today.   Myofascial Release Soft tissue mobilization, release at right axilla.                         [facility] Goals - 04/07/14 1253    CC Long Term Goal  #1   Status On-going   CC Long Term Goal  #2   Status On-going            Plan - 04/20/14 1120    Clinical Impression Statement Pt. with ongoing concerns due to side effects of cancer meds is clearly anxious about what to do.  Right inferior/posterior axilla is thick and firm to palpation today; area between scars not bad.  Right upper trap is very tight to palpation.   Pt will benefit from skilled therapeutic intervention in order to improve on the following deficits Increased edema;Pain;Impaired flexibility   Rehab Potential Good   Clinical Impairments Affecting Rehab Potential active cancer, with recent evidence of spread   PT Frequency 2x / week   PT Duration 12 weeks   PT Treatment/Interventions Manual lymph drainage;Manual techniques;Scar mobilization   PT Next Visit Plan Manual lymph drainage, myofascial release, soft tissue work.   Consulted and Agree with Plan of Care Patient   PT Plan Continue manual lymph drainage, soft tissue work, and myofascial release for pain and edema control.        Problem List  Patient Active Problem List   Diagnosis Date Noted  . Chest wall pain 03/21/2014  . Abnormal LFTs (liver function tests) 09/12/2013  . Pleural effusion 08/18/2013  . 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 Breast cancer metastasized to multiple sites 08/18/2013    [facility] 04/20/2014, 11:25 AM  [place], [place], [address] Phone: [phone]   Fax:  [phone]   [name], [place]

## 2014-04-21 ENCOUNTER — Ambulatory Visit: Payer: 59 | Admitting: Physical Therapy

## 2014-04-24 ENCOUNTER — Ambulatory Visit: Payer: 59 | Admitting: Physical Therapy

## 2014-04-24 ENCOUNTER — Other Ambulatory Visit (HOSPITAL_BASED_OUTPATIENT_CLINIC_OR_DEPARTMENT_OTHER): Payer: 59

## 2014-04-24 ENCOUNTER — Ambulatory Visit (HOSPITAL_BASED_OUTPATIENT_CLINIC_OR_DEPARTMENT_OTHER): Payer: 59 | Admitting: Hematology and Oncology

## 2014-04-24 ENCOUNTER — Other Ambulatory Visit: Payer: Self-pay

## 2014-04-24 ENCOUNTER — Telehealth: Payer: Self-pay | Admitting: Oncology

## 2014-04-24 DIAGNOSIS — R7989 Other specified abnormal findings of blood chemistry: Secondary | ICD-10-CM

## 2014-04-24 DIAGNOSIS — C778 Secondary and unspecified malignant neoplasm of lymph nodes of multiple regions: Secondary | ICD-10-CM

## 2014-04-24 DIAGNOSIS — R0789 Other chest pain: Secondary | ICD-10-CM

## 2014-04-24 DIAGNOSIS — B370 Candidal stomatitis: Secondary | ICD-10-CM

## 2014-04-24 DIAGNOSIS — M6289 Other specified disorders of muscle: Secondary | ICD-10-CM

## 2014-04-24 DIAGNOSIS — I890 Lymphedema, not elsewhere classified: Secondary | ICD-10-CM

## 2014-04-24 DIAGNOSIS — J90 Pleural effusion, not elsewhere classified: Secondary | ICD-10-CM

## 2014-04-24 DIAGNOSIS — C50412 Malignant neoplasm of upper-outer quadrant of left female breast: Secondary | ICD-10-CM

## 2014-04-24 DIAGNOSIS — R945 Abnormal results of liver function studies: Secondary | ICD-10-CM

## 2014-04-24 DIAGNOSIS — C50919 Malignant neoplasm of unspecified site of unspecified female breast: Secondary | ICD-10-CM

## 2014-04-24 DIAGNOSIS — C50211 Malignant neoplasm of upper-inner quadrant of right female breast: Secondary | ICD-10-CM

## 2014-04-24 LAB — CBC WITH DIFFERENTIAL/PLATELET
BASO%: 0.4 % (ref 0.0–2.0)
Basophils Absolute: 0 10*3/uL (ref 0.0–0.1)
EOS%: 2.9 % (ref 0.0–7.0)
Eosinophils Absolute: 0.1 10*3/uL (ref 0.0–0.5)
HCT: 42.5 % (ref 34.8–46.6)
HEMOGLOBIN: 14.5 g/dL (ref 11.6–15.9)
LYMPH#: 1.1 10*3/uL (ref 0.9–3.3)
LYMPH%: 23.4 % (ref 14.0–49.7)
MCH: 31.9 pg (ref 25.1–34.0)
MCHC: 34.1 g/dL (ref 31.5–36.0)
MCV: 93.4 fL (ref 79.5–101.0)
MONO#: 0.4 10*3/uL (ref 0.1–0.9)
MONO%: 8.2 % (ref 0.0–14.0)
NEUT#: 3.2 10*3/uL (ref 1.5–6.5)
NEUT%: 65.1 % (ref 38.4–76.8)
Platelets: 331 10*3/uL (ref 145–400)
RBC: 4.55 10*6/uL (ref 3.70–5.45)
RDW: 12 % (ref 11.2–14.5)
WBC: 4.9 10*3/uL (ref 3.9–10.3)

## 2014-04-24 LAB — COMPREHENSIVE METABOLIC PANEL (CC13)
ALBUMIN: 4 g/dL (ref 3.5–5.0)
ALT: 169 U/L — AB (ref 0–55)
AST: 95 U/L — ABNORMAL HIGH (ref 5–34)
Alkaline Phosphatase: 118 U/L (ref 40–150)
Anion Gap: 11 mEq/L (ref 3–11)
BUN: 10.1 mg/dL (ref 7.0–26.0)
CO2: 27 mEq/L (ref 22–29)
Calcium: 10.1 mg/dL (ref 8.4–10.4)
Chloride: 103 mEq/L (ref 98–109)
Creatinine: 0.8 mg/dL (ref 0.6–1.1)
EGFR: 82 mL/min/{1.73_m2} — ABNORMAL LOW (ref >90)
Glucose: 70 mg/dl (ref 70–140)
POTASSIUM: 4.1 meq/L (ref 3.5–5.1)
Sodium: 141 mEq/L (ref 136–145)
Total Bilirubin: 0.4 mg/dL (ref 0.20–1.20)
Total Protein: 7.7 g/dL (ref 6.4–8.3)

## 2014-04-24 NOTE — Assessment & Plan Note (Signed)
Metastatic breast cancer: Very complicated history most recently being treated with Afinitor and exemestane both of which were discontinued as of 04/20/2014. Patient's liver function tests had gone up and hence Afinitor was stopped, (even though dose was reduced to 2.5 mg, the LFTs continue to be elevated. Patient stopped this on 04/17/2014)  Thrush: Diflucan was prescribed on 04/18/2014 and this led to improvement in the thrush but her AST and ALT continued to increase. Because Diflucan has been associated with liver enzyme elevations, we discontinued this treatment at this time.  Increased sensitivity of tongue: Patient feels that it is swollen, unclear etiology, I discussed with her that if it is a reaction to Diflucan then we can wait and watch. She will call us back this Wednesday or Thursday to let us know how her tongue feels. It could be a reaction to one of her medications or it could be a vitamin deficiency but she attributes to medication because it started right after Diflucan was started. I encouraged her to continue the Magic mouthwash for the time being.

## 2014-04-24 NOTE — Telephone Encounter (Signed)
Pt appt. With Dr. [name] @ [facility] is 05/02/14@1 :20. Left pt vm in ref to appt. Medical records faxed.

## 2014-04-24 NOTE — Therapy (Signed)
[place], [place], [address] Phone: [phone]   Fax:  [phone]  Physical Therapy Treatment  Patient Details  Name: [name] [name] MRN: [id number] Date of Birth: [birth year] Referring Provider:  Deatra Robinson, MD  Encounter Date: 04/24/2014      PT End of Session - 04/24/14 1241    Visit Number 8   Number of Visits 25   Date for PT Re-Evaluation 06/16/14   PT Start Time 1033   PT Stop Time 1121   PT Time Calculation (min) 48 min   Activity Tolerance Patient tolerated treatment well   Behavior During Therapy Agitated  prior to treatment; reported feeling better after      Past Medical History  Diagnosis Date  . Seizures 2010    Isolated incident.  . Breast cancer dx'd 2005/2011  . PONV (postoperative nausea and vomiting)   . Peripheral vascular disease 02/2010    blood clot related to porta cath    Past Surgical History  Procedure Laterality Date  . Breast lumpectomy  2005  . Axillary lymph node dissection  Dec. 2011  . Portacath placement  12/11  . Removal portacath    . Mediastinotomy chamberlain mcneil Left 06/02/2013    Procedure: MEDIASTINOTOMY CHAMBERLAIN MCNEIL;  Surgeon: [name], MD;  Location: [facility];  Service: Thoracic;  Laterality: Left;  LEFT ANTERIOR MEDIASTINOTOMY     There were no vitals taken for this visit.  Visit Diagnosis:  Lymphedema  Other chest pain  Muscle stiffness      Subjective Assessment - 04/24/14 1237    Symptoms Has been having a lot of mouth pain; unable to sleep last night without medication.   Currently in Pain? Yes   Pain Score 9    Pain Location Mouth  left tongue   Pain Orientation Left   Pain Type Acute pain   Pain Onset In the past 7 days   Pain Frequency Constant   Aggravating Factors  talking, eating   Pain Relieving Factors Magic mouthwash helps a little                    [facility] Adult PT  Treatment/Exercise - 04/24/14 0001    Manual Therapy   Edema Management Manual lymph drainage:  in left sidelying, posterior interaxillary anatomosis right to left and right axillo-inguinal anastomosis; in supine, short neck, left axilla and anterior interaxillary anastomosis, area between two lateral breast scars, and right upper arm; in right sidelying, left periscapular area to left groin.   Massage In sitting, soft tissue work to upper back right and left.   Myofascial Release soft tissue mobilization and release at right axilla.                        [facility] Goals - 04/07/14 1253    CC Long Term Goal  #1   Status On-going   CC Long Term Goal  #2   Status On-going            Plan - 04/24/14 1242    Clinical Impression Statement Right lateral breast area between scars is full but not indurated today; some firmness/fullness at right posterior axilla and tightness right upper trap.   PT Treatment/Interventions Manual lymph drainage;Manual techniques;Scar mobilization   PT Next Visit Plan Manual lymph drainage, myofascial release, soft tissue work.   Consulted and Agree with Plan of Care Patient   PT  Plan Continue manual lymph drainage, soft tissue work, and myofascial release for pain and edema control.        Problem List Patient Active Problem List   Diagnosis Date Noted  . Chest wall pain 03/21/2014  . Abnormal LFTs (liver function tests) 09/12/2013  . Pleural effusion 08/18/2013  . 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 Breast cancer metastasized to multiple sites 08/18/2013    [facility] 04/24/2014, 12:44 PM  [place], [place], [address] Phone: [phone]   Fax:  [phone]  [name], [place]

## 2014-04-24 NOTE — Progress Notes (Signed)
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 1.7 cm, grade 2 of 3.  Margins free of tumor.  Atypical lobular hyperplasis noted. One sentinel node removed & negative for metastatic disease.  Estrogen receptor positive, progesterone receptor positive.  Her-2/neu 2+  FISH neg   03/15/2004 Cancer Staging T1c, N0, MX. Stage IA invasive ductal carcinoma.  Grade 2.  Estrogen receptor 95% positive, progesterone receptor 65% positive, HER-2 not amplified.    BRCA negative    - 07/19/2004 Chemotherapy cyclophosphamide and doxorubicin 21 days x 4 cycles.    - 10/28/2004 Radiation Therapy Adjuvant - Dr. Donella Stade [facility])    Pathology Results     Pleural effusion   06/02/2013 Pathology Results Lymph Node biopsy. CISH - HER-2/neu - no amplification.  Ratio HER2/CEP 17 = 1.47.  Avg HER2 copy number per cell = 3.45.  ER pos 80% , PR pos 2%, GCDFP pos, TTF-1 neg, napsin-A neg   06/02/2013 Initial Diagnosis Metastiv carcinoma consistnet with breast primary.  Mediastinal adenopathy - invasive ductal carcinoma.     06/05/2013 - 08/17/2013 Chemotherapy Letrozole - discontinued d/t side effects   08/16/2013 Pathology Results PLeural Fluid - Positive MOC-31, CK7, ER, PR negative, CK20, WT-1, TTF-1, calretinin   08/16/2013 Procedure Thoracentesis 1.5 L clear yellow    08/16/2013 Imaging PET extensive multifocal left pleural metastatic disease,  Hypermetabolic prevascular and upper abdominal retroperitoneal adenopathy.  Shift cardiac/mediastinal structures to rt, metastatic lesion in left medial clavicle, hepatic steatosis    08/24/2013 Procedure Thoracentesis 1 L serosanguineous removed    Pathology Results Invasive ductal carcinoma (gross cystic disease fluid protein positive, TTS-1 negative), estrogen receptor 80% positive, progesterone receptor 2% positive, HER-2 not amplified.     09/01/2013 -  Chemotherapy Eribulin 21 day cycle day 1 and 8   09/01/2013 Procedure Left pleural drain place. 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   [facility] - BRCA negative   10/24/2009 Cancer Staging Invasive dyctal carcinoma, grade 3 estrogen receptor 100% positive, progesterone receptor 2% positive (alert score 5) HER-2 negative    - 06/29/2010 Radiation Therapy 45 Gy - rt axillary and rt supraclavicular nodal areas with capecitabine sensitization   09/2009 Imaging PET scan - no evidence of systemic disease.  Rt axillary lymph node dissection 3/8 nodes involved by tumor with extracapsular extension.    11/01/2009 Pathology Results Needle core biopsy right breast - Invasive adenocarcinoma, ductal, grade 2 of 3   11/01/2009 Pathology Results Needle core biopsy rt axilla - invasive adenocarcinoma of breast, ductal, grade 3 of 3.  ER 100% 3+, PR 2% 3+, HER2/NEU 1+ (Negative)    - 12/28/2009 Chemotherapy 3 of 4 cycles docetaxel and cyclophosphamide.  4th omitted d/t elevated LFTs.  Intolerant letrozole and exemestane.  03/06/2010 Pathology Results Rt axillary lymph node: 3/8 positive metastatic adenocarcinoma with extensive extracapsular extension   03/06/2010 Cancer Staging Lymph nodes - pTX, pN1a, pMX   11/2010 - 05/2011 Chemotherapy Tamoxifen with interruptions   05/2013 -  Chemotherapy Tamoxifen continuous    CHIEF COMPLIANT: Increased  sensitivity of the tongue, oral thrush, elevated AST ALT  INTERVAL HISTORY: [name] [name] is a 54 year old lady with above-mentioned history of complex metastatic breast cancer who could not tolerate Afinitor and exemestane treatment because of elevation of liver function tests is here for elevation of AST and ALT . When she saw Dr. Jana Hakim on 18th of January, the decision was made to continue Afinitor 2.5 mg daily but the patient decided to not take it any further at that time. She called the next day complaining of white patches in the mouth and was prescribed oral Diflucan. She took that for the past 6 days and came in today for follow-up reporting that the oral thrush is improved but her tongue feels like it's under a lot of pain and razor sharp discomfort over it. She has been taking Magic mouthwash which suits her tongue for about 1-2 hours and she has to take it again for pain relief so that she can eat and drink. She reports that this tongue sensation started only after starting Diflucan.  REVIEW OF SYSTEMS:   Constitutional: Denies fevers, chills or abnormal weight loss Eyes: Denies blurriness of vision Ears, nose, mouth, throat, and face: tongue sore, thrush with sore under gums Respiratory: Denies cough, dyspnea or wheezes Cardiovascular: Denies palpitation, chest discomfort or lower extremity swelling Gastrointestinal:  Denies nausea, heartburn or change in bowel habits Skin: Denies abnormal skin rashes Lymphatics: Denies new lymphadenopathy or easy bruising Neurological:Denies numbness, tingling or new weaknesses Behavioral/Psych: frustrated.  All other systems were reviewed with the patient and are negative.  I have reviewed the past medical history, past surgical history, social history and family history with the patient and they are unchanged from previous note.  ALLERGIES:  is allergic to decadron; enoxaparin; hydromorphone hcl; morphine and related; and tegaderm ag  mesh.  MEDICATIONS:  Current Outpatient Prescriptions  Medication Sig Dispense Refill  . ALPRAZolam (XANAX) 0.5 MG tablet Take 1 tablet (0.5 mg total) by mouth 2 (two) times daily as needed for anxiety. (Patient not taking: Reported on 04/06/2014) 30 tablet 0  . b complex vitamins capsule Take 1 capsule by mouth daily.    . Calcium-Magnesium-Vitamin D (CITRACAL CALCIUM+D PO) Take by mouth. 457m vitamin C, 502mvitamin D3 2 PO DAILY    . Diphenhyd-Hydrocort-Nystatin (FIRST-DUKES MOUTHWASH) SUSP Use as directed 10 mLs in the mouth or throat 4 (four) times daily as needed. 240 mL 6  . everolimus (AFINITOR) 2.5 MG tablet Take 1 tablet (2.5 mg total) by mouth daily. (Patient not taking: Reported on 04/14/2014) 30 tablet 1  . exemestane (AROMASIN) 25 MG tablet Take 1 tablet (25 mg total) by mouth daily after breakfast. 90 tablet 4  . fluconazole (DIFLUCAN) 100 MG tablet Take 1 tablet (100 mg total) by mouth daily. 10 tablet 1  . folic acid (FOLVITE) 1 MG tablet Take 1 mg by mouth daily.    . Marland Kitchenbuprofen (ADVIL,MOTRIN) 200 MG tablet Take 200 mg by mouth every 4 (four) hours as needed.    . Melatonin 1 MG TABS Take 3 mg by mouth at bedtime as needed (sleep).     . palbociclib (IBRANCE) 125 MG capsule Take 125 mg by mouth.    .Marland Kitchen  VITAMIN D, CHOLECALCIFEROL, PO Take 2,000 Units by mouth daily.     . [DISCONTINUED] metoCLOPramide (REGLAN) 10 MG tablet Take 1 tablet (10 mg total) by mouth 4 (four) times daily -  before meals and at bedtime. 60 tablet 3   No current facility-administered medications for this visit.    PHYSICAL EXAMINATION: ECOG PERFORMANCE STATUS: 1 - Symptomatic but completely ambulatory  There were no vitals filed for this visit. There were no vitals filed for this visit.  GENERAL:alert, no distress and comfortable SKIN: skin color, texture, turgor are normal, no rashes or significant lesions EYES: normal, Conjunctiva are pink and non-injected, sclera clear OROPHARYNX:no exudate, no  erythema and lips, buccal mucosa, and tongue normal  NECK: supple, thyroid normal size, non-tender, without nodularity LYMPH:  no palpable lymphadenopathy in the cervical, axillary or inguinal LUNGS: clear to auscultation and percussion with normal breathing effort HEART: regular rate & rhythm and no murmurs and no lower extremity edema ABDOMEN:abdomen soft, non-tender and normal bowel sounds Musculoskeletal:no cyanosis of digits and no clubbing  NEURO: alert & oriented x 3 with fluent speech, no focal motor/sensory deficits  LABORATORY DATA:  I have reviewed the data as listed   Chemistry      Component Value Date/Time   NA 141 04/24/2014 1154   NA 134* 09/03/2013 2145   K 4.1 04/24/2014 1154   K 3.7 09/03/2013 2145   CL 97 09/03/2013 2145   CL 105 05/06/2012 1333   CO2 27 04/24/2014 1154   CO2 23 09/03/2013 2145   BUN 10.1 04/24/2014 1154   BUN 8 09/03/2013 2145   CREATININE 0.8 04/24/2014 1154   CREATININE 0.64 09/03/2013 2145      Component Value Date/Time   CALCIUM 10.1 04/24/2014 1154   CALCIUM 9.0 09/03/2013 2145   ALKPHOS 118 04/24/2014 1154   ALKPHOS 114 09/03/2013 2145   AST 95* 04/24/2014 1154   AST 55* 09/03/2013 2145   ALT 169* 04/24/2014 1154   ALT 85* 09/03/2013 2145   BILITOT 0.40 04/24/2014 1154   BILITOT 0.3 09/03/2013 2145       Lab Results  Component Value Date   WBC 4.9 04/24/2014   HGB 14.5 04/24/2014   HCT 42.5 04/24/2014   MCV 93.4 04/24/2014   PLT 331 04/24/2014   NEUTROABS 3.2 04/24/2014     RADIOGRAPHIC STUDIES: I have personally reviewed the radiology reports and agreed with their findings. No results found.   ASSESSMENT & PLAN:  Breast cancer of upper-inner quadrant of right female breast Metastatic breast cancer: Very complicated history most recently being treated with Afinitor and exemestane both of which were discontinued as of 04/20/2014. Patient's liver function tests had gone up and hence Afinitor was stopped, (even though  dose was reduced to 2.5 mg, the LFTs continue to be elevated. Patient stopped this on 04/17/2014)  Thrush: Diflucan was prescribed on 04/18/2014 and this led to improvement in the thrush but her AST and ALT continued to increase. Because Diflucan has been associated with liver enzyme elevations, we discontinued this treatment at this time.  Increased sensitivity of tongue: Patient feels that it is swollen, unclear etiology, I discussed with her that if it is a reaction to Diflucan then we can wait and watch. She will call us back this Wednesday or Thursday to let us know how her tongue feels. It could be a reaction to one of her medications or it could be a vitamin deficiency but she attributes to medication because it started right  after Diflucan was started. I encouraged her to continue the Magic mouthwash for the time being.     No orders of the defined types were placed in this encounter.   The patient has a good understanding of the overall plan. she agrees with it. She will call with any problems that may develop before her next visit here.   Rulon Eisenmenger, MD

## 2014-04-25 ENCOUNTER — Telehealth: Payer: Self-pay | Admitting: *Deleted

## 2014-04-25 ENCOUNTER — Other Ambulatory Visit: Payer: Self-pay | Admitting: *Deleted

## 2014-04-25 DIAGNOSIS — C50919 Malignant neoplasm of unspecified site of unspecified female breast: Secondary | ICD-10-CM

## 2014-04-25 MED ORDER — NYSTATIN 100000 UNIT/ML MT SUSP: 5.0000 mL | Freq: Three times a day (TID) | OROMUCOSAL | Status: DC

## 2014-04-25 NOTE — Telephone Encounter (Signed)
Patient called reporting "Sore at base of tongue is why she has pain and swelling to the left side of her mouth.  I need the Nystatin called in as soon as possible.  This is not a reaction."  Called Dr. Lindi Adie and notified him of this sore to frenulum and patient's request for Nystatin.  Verbal order received and read back from Dr. Lindi Adie for Nystatin 5 ml tid x 1 week.  Order sent to [facility] and [employer].  Patient notified.

## 2014-04-26 ENCOUNTER — Telehealth: Payer: Self-pay | Admitting: *Deleted

## 2014-04-26 ENCOUNTER — Other Ambulatory Visit: Payer: Self-pay | Admitting: *Deleted

## 2014-04-26 DIAGNOSIS — C50919 Malignant neoplasm of unspecified site of unspecified female breast: Secondary | ICD-10-CM

## 2014-04-26 MED ORDER — NYSTATIN 100000 UNIT/ML MT SUSP: 5.0000 mL | Freq: Three times a day (TID) | OROMUCOSAL | Status: DC

## 2014-04-26 NOTE — Telephone Encounter (Signed)
Yes please give 2 refills

## 2014-04-26 NOTE — Telephone Encounter (Signed)
Patient called requesting refill on Nystatin.  "It does not look like it is enough."  Informed her the volume is correct for a 7-day supply. Advised to check bottle and if it does not read 105 ml to contact Pharmacy for correction at no charge.  Does not want to run out over the weekend because she is in a lot of pain.  Will ask Dr. Lindi Adie if a refill is allowed.

## 2014-04-28 ENCOUNTER — Ambulatory Visit: Payer: 59 | Admitting: Physical Therapy

## 2014-04-28 DIAGNOSIS — I890 Lymphedema, not elsewhere classified: Secondary | ICD-10-CM | POA: Diagnosis not present

## 2014-04-28 DIAGNOSIS — M6289 Other specified disorders of muscle: Secondary | ICD-10-CM

## 2014-04-28 DIAGNOSIS — R0789 Other chest pain: Secondary | ICD-10-CM

## 2014-04-28 NOTE — Therapy (Signed)
[place], [place], [address] Phone: [phone]   Fax:  [phone]  Physical Therapy Treatment  Patient Details  Name: [name] [name] MRN: [id number] Date of Birth: 12/31/1960 Referring Provider:  Deatra Robinson, MD  Encounter Date: 04/28/2014      PT End of Session - 04/28/14 1219    Visit Number 9   Number of Visits 25   Date for PT Re-Evaluation 06/16/14   PT Start Time 1115   PT Stop Time 5638   PT Time Calculation (min) 41 min      Past Medical History  Diagnosis Date  . Seizures 2010    Isolated incident.  . Breast cancer dx'd 2005/2011  . PONV (postoperative nausea and vomiting)   . Peripheral vascular disease 02/2010    blood clot related to porta cath    Past Surgical History  Procedure Laterality Date  . Breast lumpectomy  2005  . Axillary lymph node dissection  Dec. 2011  . Portacath placement  12/11  . Removal portacath    . Mediastinotomy chamberlain mcneil Left 06/02/2013    Procedure: MEDIASTINOTOMY CHAMBERLAIN MCNEIL;  Surgeon: [name], MD;  Location: [place];  Service: Thoracic;  Laterality: Left;  LEFT ANTERIOR MEDIASTINOTOMY     There were no vitals taken for this visit.  Visit Diagnosis:  Lymphedema  Other chest pain  Muscle stiffness      Subjective Assessment - 04/28/14 1209    Symptoms Still having a lot of mouth pain.  Met with Dr. Lindi Adie on Monday.   Currently in Pain? Yes   Pain Location Mouth   Pain Orientation Left                    [facility] Adult PT Treatment/Exercise - 04/28/14 0001    Manual Therapy   Edema Management In left sidelying, manual lymph drainage: posterior interaxillary anastomosis right to left and right axillo-inguinal anastomosis.  In supine, short neck, left axilla and anterior interaxillary anatomosis, right groin and axillo-inguinal anastomosis, and area between two right lateral breast scars,  following along pathways. In right sidelying, from left periscapular area to left groin.   Massage In right sidelying, left upper back.   Myofascial Release Right axilla soft tissue mobilization and myofascial release.                        [facility] Goals - 04/07/14 1253    CC Long Term Goal  #1   Status On-going   CC Long Term Goal  #2   Status On-going            Plan - 04/28/14 1219    Clinical Impression Statement Right lateral breast area between scars is indurated today; quite hard.  Significant firmness/fullness at left lateral upper-mid back (lateral scapula).   Pt will benefit from skilled therapeutic intervention in order to improve on the following deficits Increased edema;Pain;Impaired flexibility   Rehab Potential Good   Clinical Impairments Affecting Rehab Potential active cancer, with recent evidence of spread   PT Frequency 2x / week   PT Duration 12 weeks   PT Treatment/Interventions Manual lymph drainage;Manual techniques;Scar mobilization   PT Next Visit Plan Manual lymph drainage, myofascial release, soft tissue work.   Consulted and Agree with Plan of Care Patient        Problem List Patient Active Problem List   Diagnosis Date Noted  .  Chest wall pain 03/21/2014  . Abnormal LFTs (liver function tests) 09/12/2013  . Pleural effusion 08/18/2013  . 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 Breast cancer metastasized to multiple sites 08/18/2013    Cheston Coury 04/28/2014, 12:21 PM  [place], [place], [address] Phone: [phone]   Fax:  [phone]  [name], [place]

## 2014-05-01 ENCOUNTER — Ambulatory Visit: Payer: 59 | Attending: Oncology | Admitting: Physical Therapy

## 2014-05-01 ENCOUNTER — Other Ambulatory Visit (HOSPITAL_BASED_OUTPATIENT_CLINIC_OR_DEPARTMENT_OTHER): Payer: 59

## 2014-05-01 DIAGNOSIS — C50211 Malignant neoplasm of upper-inner quadrant of right female breast: Secondary | ICD-10-CM

## 2014-05-01 DIAGNOSIS — C50412 Malignant neoplasm of upper-outer quadrant of left female breast: Secondary | ICD-10-CM

## 2014-05-01 DIAGNOSIS — C773 Secondary and unspecified malignant neoplasm of axilla and upper limb lymph nodes: Secondary | ICD-10-CM

## 2014-05-01 DIAGNOSIS — C782 Secondary malignant neoplasm of pleura: Secondary | ICD-10-CM

## 2014-05-01 DIAGNOSIS — R7989 Other specified abnormal findings of blood chemistry: Secondary | ICD-10-CM

## 2014-05-01 DIAGNOSIS — R945 Abnormal results of liver function studies: Secondary | ICD-10-CM

## 2014-05-01 DIAGNOSIS — R0789 Other chest pain: Secondary | ICD-10-CM

## 2014-05-01 DIAGNOSIS — C50919 Malignant neoplasm of unspecified site of unspecified female breast: Secondary | ICD-10-CM

## 2014-05-01 DIAGNOSIS — I890 Lymphedema, not elsewhere classified: Secondary | ICD-10-CM | POA: Diagnosis present

## 2014-05-01 DIAGNOSIS — J90 Pleural effusion, not elsewhere classified: Secondary | ICD-10-CM

## 2014-05-01 LAB — CBC WITH DIFFERENTIAL/PLATELET
BASO%: 1.3 % (ref 0.0–2.0)
Basophils Absolute: 0.1 10*3/uL (ref 0.0–0.1)
EOS%: 1.6 % (ref 0.0–7.0)
Eosinophils Absolute: 0.1 10*3/uL (ref 0.0–0.5)
HCT: 40.2 % (ref 34.8–46.6)
HGB: 13.3 g/dL (ref 11.6–15.9)
LYMPH%: 19.7 % (ref 14.0–49.7)
MCH: 30.9 pg (ref 25.1–34.0)
MCHC: 33.1 g/dL (ref 31.5–36.0)
MCV: 93.6 fL (ref 79.5–101.0)
MONO#: 0.4 10*3/uL (ref 0.1–0.9)
MONO%: 7.5 % (ref 0.0–14.0)
NEUT%: 69.9 % (ref 38.4–76.8)
NEUTROS ABS: 4 10*3/uL (ref 1.5–6.5)
PLATELETS: 442 10*3/uL — AB (ref 145–400)
RBC: 4.29 10*6/uL (ref 3.70–5.45)
RDW: 12.4 % (ref 11.2–14.5)
WBC: 5.8 10*3/uL (ref 3.9–10.3)
lymph#: 1.1 10*3/uL (ref 0.9–3.3)

## 2014-05-01 LAB — COMPREHENSIVE METABOLIC PANEL (CC13)
ALT: 107 U/L — AB (ref 0–55)
ANION GAP: 11 meq/L (ref 3–11)
AST: 51 U/L — ABNORMAL HIGH (ref 5–34)
Albumin: 3.7 g/dL (ref 3.5–5.0)
Alkaline Phosphatase: 101 U/L (ref 40–150)
BILIRUBIN TOTAL: 0.34 mg/dL (ref 0.20–1.20)
BUN: 11.3 mg/dL (ref 7.0–26.0)
CHLORIDE: 106 meq/L (ref 98–109)
CO2: 26 mEq/L (ref 22–29)
Calcium: 9.3 mg/dL (ref 8.4–10.4)
Creatinine: 0.8 mg/dL (ref 0.6–1.1)
EGFR: 79 mL/min/{1.73_m2} — ABNORMAL LOW (ref >90)
Glucose: 71 mg/dl (ref 70–140)
Potassium: 3.6 mEq/L (ref 3.5–5.1)
Sodium: 143 mEq/L (ref 136–145)
Total Protein: 6.9 g/dL (ref 6.4–8.3)

## 2014-05-01 NOTE — Therapy (Signed)
[place], [place], [address] Phone: [phone]   Fax:  [phone]  Physical Therapy Treatment  Patient Details  Name: [name] [name] MRN: [id number] Date of Birth: 1960-05-20 Referring Provider:  Deatra Robinson, MD  Encounter Date: 05/01/2014      PT End of Session - 05/01/14 1751    Visit Number 10   Number of Visits 25   Date for PT Re-Evaluation 06/16/14   PT Start Time 6962   PT Stop Time 1104   PT Time Calculation (min) 41 min      Past Medical History  Diagnosis Date  . Seizures 2010    Isolated incident.  . Breast cancer dx'd 2005/2011  . PONV (postoperative nausea and vomiting)   . Peripheral vascular disease 02/2010    blood clot related to porta cath    Past Surgical History  Procedure Laterality Date  . Breast lumpectomy  2005  . Axillary lymph node dissection  Dec. 2011  . Portacath placement  12/11  . Removal portacath    . Mediastinotomy chamberlain mcneil Left 06/02/2013    Procedure: MEDIASTINOTOMY CHAMBERLAIN MCNEIL;  Surgeon: [name], MD;  Location: [place];  Service: Thoracic;  Laterality: Left;  LEFT ANTERIOR MEDIASTINOTOMY     There were no vitals taken for this visit.  Visit Diagnosis:  Lymphedema  Other chest pain      Subjective Assessment - 05/01/14 1023    Symptoms Thrush is gone; mouth sores much improved.   Currently in Pain? Yes   Pain Score 3    Pain Location Axilla   Pain Orientation Right   Multiple Pain Sites Yes   Pain Score 3   Pain Location Chest   Pain Orientation Left;Lateral                    [facility] Adult PT Treatment/Exercise - 05/01/14 0001    Manual Therapy   Edema Management Manual lymph drainage as on 04/28/14.    Massage As on 04/28/14.   Myofascial Release As on 04/28/14.                        [facility] Goals - 04/07/14 1253    CC Long Term Goal  #1   Status  On-going   CC Long Term Goal  #2   Status On-going            Plan - 05/01/14 1751    Clinical Impression Statement Less induration than at last session, but a moderate amount today.   Pt will benefit from skilled therapeutic intervention in order to improve on the following deficits Increased edema;Pain;Impaired flexibility   Rehab Potential Good   Clinical Impairments Affecting Rehab Potential active cancer, with recent evidence of spread   PT Frequency 2x / week   PT Duration 12 weeks   PT Treatment/Interventions Manual lymph drainage;Manual techniques;Scar mobilization   PT Next Visit Plan Manual lymph drainage, myofascial release, soft tissue work.   Consulted and Agree with Plan of Care Patient        Problem List Patient Active Problem List   Diagnosis Date Noted  . Chest wall pain 03/21/2014  . Abnormal LFTs (liver function tests) 09/12/2013  . Pleural effusion 08/18/2013  . 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 Breast cancer metastasized to multiple sites 08/18/2013    [place] 05/01/2014, 5:54 PM  [facility]  [place], [place], [address] Phone: [phone]   Fax:  [phone]  [name], [place]

## 2014-05-02 ENCOUNTER — Other Ambulatory Visit: Payer: Self-pay | Admitting: *Deleted

## 2014-05-02 DIAGNOSIS — C50919 Malignant neoplasm of unspecified site of unspecified female breast: Secondary | ICD-10-CM

## 2014-05-02 DIAGNOSIS — R0789 Other chest pain: Secondary | ICD-10-CM

## 2014-05-02 DIAGNOSIS — C50411 Malignant neoplasm of upper-outer quadrant of right female breast: Secondary | ICD-10-CM | POA: Diagnosis present

## 2014-05-02 DIAGNOSIS — C50211 Malignant neoplasm of upper-inner quadrant of right female breast: Secondary | ICD-10-CM

## 2014-05-05 ENCOUNTER — Ambulatory Visit: Payer: 59 | Admitting: Physical Therapy

## 2014-05-05 ENCOUNTER — Telehealth: Payer: Self-pay | Admitting: Oncology

## 2014-05-05 ENCOUNTER — Ambulatory Visit (HOSPITAL_BASED_OUTPATIENT_CLINIC_OR_DEPARTMENT_OTHER): Payer: 59 | Admitting: Oncology

## 2014-05-05 DIAGNOSIS — R0789 Other chest pain: Secondary | ICD-10-CM

## 2014-05-05 DIAGNOSIS — I890 Lymphedema, not elsewhere classified: Secondary | ICD-10-CM | POA: Diagnosis not present

## 2014-05-05 DIAGNOSIS — R7989 Other specified abnormal findings of blood chemistry: Secondary | ICD-10-CM

## 2014-05-05 DIAGNOSIS — C50211 Malignant neoplasm of upper-inner quadrant of right female breast: Secondary | ICD-10-CM

## 2014-05-05 DIAGNOSIS — J90 Pleural effusion, not elsewhere classified: Secondary | ICD-10-CM

## 2014-05-05 DIAGNOSIS — R945 Abnormal results of liver function studies: Secondary | ICD-10-CM

## 2014-05-05 DIAGNOSIS — C50919 Malignant neoplasm of unspecified site of unspecified female breast: Secondary | ICD-10-CM

## 2014-05-05 NOTE — Telephone Encounter (Signed)
gv and printed appt sched and avs forpt for Feb and March °

## 2014-05-05 NOTE — Progress Notes (Signed)
[name] [name]  Telephone:([phone] Fax:([phone]     ID: [name] [name] OB: [birth year]  MR#: [id number]  AJO#:[id number]  PCP: Pcp Not In System GYN:  [name] [name] SU:  OTHER MD: [name] [name], [name] [name], [name] [name], [name] [name], [name] [name]  CHIEF COMPLAINT: Stage IV breast cancer  CURRENT TREATMENT: exemestane  BREAST CANCER HISTORY: From doctor [name] [name]'s intake note 03/20/2004:  "The patient is a very pleasant 54 year old female, without significant past medical history.  Her family history is significant for a sister who at age 49 was diagnosed with invasive ductal carcinoma.  She is a breast cancer survivor at age 33 now.  The patient states that she has never really had a screening mammogram until October 2005, when she felt that it was time for her to start having mammograms done on a yearly basis.  Therefore, on 01/26/04, she underwent a screening mammogram and an abnormality was detected in the upper outer right breast.  She, therefore, underwent spot compression views of both the right and the left breast.  The left breast revealed a well-defined mass in the upper outer left quadrant, present at the 2 o'clock position, measuring 1.8 cm, 6 cm from the nipple.  This, by ultrasound, was felt to be a simple cyst measuring 1.8 cm.  On the right breast, a spiculated mass was noted in the upper outer right quadrant.  The ultrasound revealed a shadowing irregular solid mass at the 10:30 position, 9 cm from the nipple, measuring 1.2 cm in greatest dimension, correlating with the spiculated mass seen on the mammogram.  The right axilla was negative ultrasonically.  Because of this, the patient underwent a needle biopsy of the right breast and the biopsy was positive invasive mammary carcinoma that showed features consistent with a high-grade invasive ductal carcinoma associated with desmoplastic stroma.  No in situ  component was seen and no definite lymphovascular invasion was identified.  On the core biopsy, the tumor measured about 0.8 cm.  Because of this, she was seen by Dr. [name] and the patient was taken to the [facility] on March 15, 2004.  She underwent a right breast lumpectomy with sentinel node biopsy.  The final pathology revealed an invasive ductal carcinoma, measuring 1.7 cm, grade 2 of 3.  Margins were free of tumor.  Atypical lobular hyperplasia was noted.  One sentinel node was removed which was negative for metastatic disease.  The tumor was staged at T1c, N0 MX.  It was estrogen receptor positive, progesterone receptor positive.  HER-2/neu was 2+.  FISH was negative.  All margins were free of tumor.  She is now seen in Medical Oncology for further evaluation and management of this newly diagnosed T1c, node negative, stage I, invasive ductal carcinoma of the right breast."  Her subsequent history is as detailed below  INTERVAL HISTORY: [name] [name] returns today for followup of her stage IV breast cancer accompanied by her husband [name] [name]. Since her last visit here, we added everolimus to her exemestane treatment. She promptly developed a rise in her LFTs, which improved when we stopped the medication. When resumed at 2.5 mg daily, she had problems within 3 days and definitively stopped the everolimus 04/17/2014. She also developed thrush, and then her liver function tests worsened further when she received Diflucan, which caused her tongue to swell. She eventually cleared the problem with nystatin rinses..  Since her last visit here also the patient met with Dr. Albertina [name] at  [name]. [name] [name] is very appreciative of the fact that Dr. Albertina [name] did not minimize the says side effects and complications from treatments or dismiss them as psychological. Admitting that these problems are unusual, the point is simply that [name] [name] is not going to be able to tolerate even reduced doses of many otherwise appropriate interventions. In  particular [name] [name] tells me Dr. Albertina [name] suggested no [name] [name] inhibitors, to stay away from capecitabine, and that if she were to receive chemotherapy with carboplatin and the top aside those agents should not be given together, but that she should receive only a single agent at a time and starting with minimal doses, increased as tolerated. It seems to me that that is a workable plan.  REVIEW OF SYSTEMS: [name] [name] is tolerating the exemestane so far with no significant problems. She has an appointment with thoracic surgery for next week and she was hoping to have a lot of of her tumor "scraped off laparoscopically". I think ultimately what she might require is a decortication procedure with left pneumonectomy. I don't think that would improve her survival, though it might make management easier. I am sure Dr. Roxan [name] will discuss pleurodesis and a think that would be helpful in her case, as it would get her out of the repeated drainage procedures she is undergoing. She will be discussing all that with him at that time. Currently [name] denies severe headaches, visual changes, nausea, vomiting, taste alteration, or loss of appetite. She denies worsening shortness of breath, pleurisy, or hemoptysis. He has been no change in bowel or bladder habits. She continues to do her physical therapy for her lymphedema problems. A detailed review of systems today was otherwise stable  PAST MEDICAL HISTORY: Past Medical History  Diagnosis Date  . Seizures 2010    Isolated incident.  . Breast cancer dx'd 2005/2011  . PONV (postoperative nausea and vomiting)   . Peripheral vascular disease 02/2010    blood clot related to porta cath    PAST SURGICAL HISTORY: Past Surgical History  Procedure Laterality Date  . Breast lumpectomy  2005  . Axillary lymph node dissection  Dec. 2011  . Portacath placement  12/11  . Removal portacath    . Mediastinotomy chamberlain mcneil Left 06/02/2013    Procedure: MEDIASTINOTOMY  CHAMBERLAIN MCNEIL;  Surgeon: [name], MD;  Location: [facility];  Service: Thoracic;  Laterality: Left;  LEFT ANTERIOR MEDIASTINOTOMY     FAMILY HISTORY No family history on file. The patient's father is living, 27 years old as of may 2015. He lives in [place]. The patient's mother died from complications of COPD at the age of 3. These has 2 brothers, one sister. Her sister developed breast cancer at the age of 57. She is doing well. The patient herself underwent genetic testing at [facility] in 2011 and was found to be BRCA negative  GYNECOLOGIC HISTORY:  Menarche age 54, she is GX P0. She stopped having periods with her initial chemotherapy in 2006.  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 Her husband [name] [name] is a Medical illustrator in [place]. He has a child from a prior marriage. At home they have 2 rescue dogs, Hobo and [place]. The patient is religious but not a church attender    ADVANCED DIRECTIVES: In place   HEALTH MAINTENANCE: History  Substance Use Topics  . Smoking status: Never Smoker   . Smokeless tobacco: Never Used  .  Alcohol Use: No     Colonoscopy:  PAP:  Bone density: March 2015; mild osteopenia  Lipid panel:  Allergies  Allergen Reactions  . Decadron [Dexamethasone] Other (See Comments)    Patient does not tolerate steroids.   . Enoxaparin Other (See Comments)    unknown  . Hydromorphone Hcl Nausea And Vomiting  . Morphine And Related   . Tegaderm Ag Mesh [Silver]     Current Outpatient Prescriptions  Medication Sig Dispense Refill  . ALPRAZolam (XANAX) 0.5 MG tablet Take 1 tablet (0.5 mg total) by mouth 2 (two) times daily as needed for anxiety. (Patient not taking: Reported on 04/06/2014) 30 tablet 0  . b complex vitamins capsule Take 1 capsule by mouth daily.    . Calcium-Magnesium-Vitamin D (CITRACAL CALCIUM+D PO) Take by mouth. 437m vitamin C, 5087mvitamin D3 2 PO  DAILY    . Diphenhyd-Hydrocort-Nystatin (FIRST-DUKES MOUTHWASH) SUSP Use as directed 10 mLs in the mouth or throat 4 (four) times daily as needed. 240 mL 6  . exemestane (AROMASIN) 25 MG tablet Take 1 tablet (25 mg total) by mouth daily after breakfast. (Patient not taking: Reported on 04/28/2014) 90 tablet 4  . folic acid (FOLVITE) 1 MG tablet Take 1 mg by mouth daily.    . Marland Kitchenbuprofen (ADVIL,MOTRIN) 200 MG tablet Take 200 mg by mouth every 4 (four) hours as needed.    . Melatonin 1 MG TABS Take 3 mg by mouth at bedtime as needed (sleep).     . nystatin (MYCOSTATIN) 100000 UNIT/ML suspension Take 5 mLs (500,000 Units total) by mouth 3 (three) times daily. X 7 days 120 mL 2  . [DISCONTINUED] metoCLOPramide (REGLAN) 10 MG tablet Take 1 tablet (10 mg total) by mouth 4 (four) times daily -  before meals and at bedtime. 60 tablet 3   No current facility-administered medications for this visit.    OBJECTIVE: Middle-aged white woman who appears stated age There were no vitals filed for this visit.   Body mass index is 0.00 kg/(m^2).   There were no vitals filed for this visit. Patient refused weight and vitals again today    ECOG FS:1 - Symptomatic but completely ambulatory  Sclerae unicteric, pupils round and equal Oropharynx clear , dentition in good repair No cervical or supraclavicular adenopathy Lungs show decreased breath sounds on the left, but no rales or wheezes noted Heart regular rate and rhythm Abd soft, nontender, positive bowel sounds MSK no focal spinal tenderness Neuro: nonfocal, well oriented, anxious affect Breasts: Deferred     LAB RESULTS: Results for WEPEGI, MILAZZOMRN [id number] of 05/06/2014 07:35  Ref. Range 03/29/2014 12:50 04/12/2014 10:26 04/17/2014 11:45 04/24/2014 11:54 05/01/2014 11:32  AST Latest Range: 0-37 U/L 45 (H) 52 (H) 81 (H) 95 (H) 51 (H)  ALT Latest Range: 0-35 U/L 82 (H) 87 (H) 131 (H) 169 (H) 107 (H)   CMP     Component Value Date/Time   NA  143 05/01/2014 1132   NA 134* 09/03/2013 2145   K 3.6 05/01/2014 1132   K 3.7 09/03/2013 2145   CL 97 09/03/2013 2145   CL 105 05/06/2012 1333   CO2 26 05/01/2014 1132   CO2 23 09/03/2013 2145   GLUCOSE 71 05/01/2014 1132   GLUCOSE 121* 09/03/2013 2145   GLUCOSE 124* 05/06/2012 1333   BUN 11.3 05/01/2014 1132   BUN 8 09/03/2013 2145   CREATININE 0.8 05/01/2014 1132   CREATININE 0.64 09/03/2013 2145   CALCIUM 9.3 05/01/2014  1132   CALCIUM 9.0 09/03/2013 2145   PROT 6.9 05/01/2014 1132   PROT 6.8 09/03/2013 2145   ALBUMIN 3.7 05/01/2014 1132   ALBUMIN 3.3* 09/03/2013 2145   AST 51* 05/01/2014 1132   AST 55* 09/03/2013 2145   ALT 107* 05/01/2014 1132   ALT 85* 09/03/2013 2145   ALKPHOS 101 05/01/2014 1132   ALKPHOS 114 09/03/2013 2145   BILITOT 0.34 05/01/2014 1132   BILITOT 0.3 09/03/2013 2145   GFRNONAA >90 09/03/2013 2145   GFRAA >90 09/03/2013 2145    No results found for: SPEP  Lab Results  Component Value Date   WBC 5.8 05/01/2014   NEUTROABS 4.0 05/01/2014   HGB 13.3 05/01/2014   HCT 40.2 05/01/2014   MCV 93.6 05/01/2014   PLT 442* 05/01/2014      Chemistry      Component Value Date/Time   NA 143 05/01/2014 1132   NA 134* 09/03/2013 2145   K 3.6 05/01/2014 1132   K 3.7 09/03/2013 2145   CL 97 09/03/2013 2145   CL 105 05/06/2012 1333   CO2 26 05/01/2014 1132   CO2 23 09/03/2013 2145   BUN 11.3 05/01/2014 1132   BUN 8 09/03/2013 2145   CREATININE 0.8 05/01/2014 1132   CREATININE 0.64 09/03/2013 2145      Component Value Date/Time   CALCIUM 9.3 05/01/2014 1132   CALCIUM 9.0 09/03/2013 2145   ALKPHOS 101 05/01/2014 1132   ALKPHOS 114 09/03/2013 2145   AST 51* 05/01/2014 1132   AST 55* 09/03/2013 2145   ALT 107* 05/01/2014 1132   ALT 85* 09/03/2013 2145   BILITOT 0.34 05/01/2014 1132   BILITOT 0.3 09/03/2013 2145       Lab Results  Component Value Date   LABCA2 25 11/02/2007    No components found for: [id number]  No results for  input(s): INR in the last 168 hours.  Urinalysis    Component Value Date/Time   COLORURINE YELLOW 09/03/2013 2109   APPEARANCEUR CLEAR 09/03/2013 2109   LABSPEC 1.005 09/12/2013 1542   LABSPEC 1.016 09/03/2013 2109   PHURINE 6.0 09/03/2013 2109   GLUCOSEU Negative 09/12/2013 [place] 09/03/2013 2109   HGBUR NEGATIVE 09/03/2013 2109   [place] NEGATIVE 09/03/2013 2109   [place] NEGATIVE 09/03/2013 2109   PROTEINUR NEGATIVE 09/03/2013 2109   UROBILINOGEN 0.2 09/12/2013 1542   UROBILINOGEN 0.2 09/03/2013 2109   NITRITE NEGATIVE 09/03/2013 2109   LEUKOCYTESUR NEGATIVE 09/03/2013 2109    STUDIES: No results found.  ASSESSMENT: 54 y.o. [place] woman with stage IV breast cancer, history as follows  (1)  S/p Right lumpectomy and sentinel lymph node sampling 03/15/2004 for a pT1c pN0. Stage IA invasive ductal carcinoma, grade 2, estrogen receptor 95% positive, progesterone receptor 65% positive, HER-2 not amplified; additional surgery 04/25/2004 for seroma or clearance showed no residual tumor  (2) adjuvant chemotherapy with cyclophosphamide and doxorubicin every 21 days x4 completed 07/19/2004  (3) adjuvant radiation given under Dr. Donella Stade in [place] completed July 2006  (4) the patient opted against adjuvant antiestrogen therapy  (5) genetics testing showed no BRCA mutations  (6) biopsy of a palpable right axillary mass 10/24/2009 showed invasive ductal carcinoma, grade 3, estrogen receptor 100% positive, progesterone receptor 2% positive (alert score 5) HER-2 negative; no evidence of systemic disease on PET scanning  (7) completed 3 of 4 planned cycles of docetaxel and cyclophosphamide September 2011, fourth cycle omitted because of marked elevations in liver function tests  (8) an  right axillary lymph node dissection 03/06/2010 showed 3/8 lymph nodes removed to be involved by tumor, with extracapsular extension.  (9) 45 Gy radiation to the right  axillary and right supraclavicular nodal areas, with capecitabine sensitization, completed March 2012   (10) intolerant of letrozole and exemestane; on tamoxifen with interruptions September 2012 to March 2013, but then continuing on tamoxifen more continuously through March of 2015  (11) biopsy of mediastinal adenopathy 06/02/2013 shows invasive ductal carcinoma (gross cystic disease fluid protein positive, TTS-1 negative), estrogen receptor 80% positive, progesterone receptor 2% positive, HER-2 not amplified  (12) letrozole started March 2015-- tolerated with significant side effects, discontinued at the end of May 2015  (13) PET scan 08/16/2013 shows extensive left pleural metastatic disease and a large left pleural effusion that shifts cardiac and mediastinal structures to the right; adenopathy (celiac trunk, periadrenal, periaortic); and a left medial clavicular lesion; Status post left thoracentesis 08/16/2013 positive for adenocarcinoma, estrogen receptor positive, progesterone receptor negative.  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; everolimus was suggested as an available targeted agent  (18) exemestane started 03/31/2014  (a) everolimus added 04/03/2014 but not tolerated (cytopenis, elevated LFTs) even at minimal doses; stopped 04/17/2014    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 She is understandably discouraged and frustrated, because her body is very sensitive to treatment, which means she cannot receive full doses, which means her chance of getting a significant response are low. She is particularly concerned regarding starting any kind of chemotherapy, since her cancer is not particularly fast growing and she understands slow or growing cancers are less sensitive. Dr. Albertina [name] is also looking for appropriate trials for [place] to participate in.  Granted all that, she needs to be on some treatment. Right now we are simply continuing exemestane. She will be meeting with Dr. Roxan [name] next week. What she had in mind sounds to me like a decortication procedure. Which she probably will end up with him a if anything, is pleurodesis, which may be helpful to her from a clinical point review. Of course that would not impact her long-term survival  She greatly appreciated her meeting with Dr. Albertina [name], who took her bodies sensitivity to treatment very seriously and essentially suggested no M [name] inhibitors, no capecitabine, but consider single agent studies with AKT or PI 3K inhibitors. Jency has access of course to the [place] list of open studies, which she finds that very hard to navigate. She is interested in a "can't see urged research nurse". I believe this  also was suggested by Dr. Albertina [name].--Returns that we have a very competent research nurse who recently retired. She would be willing to consider doing  this for [place], and I gave that nurse's phone number to release at 2 make her own arrangements.  We have also arranged for a CD of [place]'s most recentcent PET scan to be mailed directly to Dr. Albertina [name] at [facility]. Finally, my partner, Dr. Sonny Dandy, is contacting his mentor in [place], Dr. Aris Lot regarding the possibility of [place] participating in his vaccine study.  Jontavia is already scheduled for repeat CT of the chest March 1 and to see me the next day. She has a good understanding of the overall plan. She knows to call for any problems that may develop before her next visit here.   Chauncey Cruel, MD   05/06/2014 7:34 AM

## 2014-05-05 NOTE — Progress Notes (Signed)
Per Dr. Jana Hakim, I called Radiology and had Pet scan placed on a CD and mailed to Dr. Esaw Dace @ [facility]. Information given to [facility] in Radiology.

## 2014-05-05 NOTE — Therapy (Signed)
[place], [place], [address] Phone: [phone]   Fax:  [phone]  Physical Therapy Treatment  Patient Details  Name: [name] [name] MRN: [id number] Date of Birth: May 26, 1960 Referring Provider:  Deatra Robinson, MD  Encounter Date: 05/05/2014      PT End of Session - 05/05/14 1143    Visit Number 11   Number of Visits 25   Date for PT Re-Evaluation 06/16/14   PT Start Time 1025   PT Stop Time 1110   PT Time Calculation (min) 45 min      Past Medical History  Diagnosis Date  . Seizures 2010    Isolated incident.  . Breast cancer dx'd 2005/2011  . PONV (postoperative nausea and vomiting)   . Peripheral vascular disease 02/2010    blood clot related to porta cath    Past Surgical History  Procedure Laterality Date  . Breast lumpectomy  2005  . Axillary lymph node dissection  Dec. 2011  . Portacath placement  12/11  . Removal portacath    . Mediastinotomy chamberlain mcneil Left 06/02/2013    Procedure: MEDIASTINOTOMY CHAMBERLAIN MCNEIL;  Surgeon: [name], MD;  Location: [facility];  Service: Thoracic;  Laterality: Left;  LEFT ANTERIOR MEDIASTINOTOMY     There were no vitals taken for this visit.  Visit Diagnosis:  Lymphedema  Other chest pain      Subjective Assessment - 05/05/14 1139    Symptoms Mouth is pretty much completely healed.   Pertinent History Saw Dr. [name] at [facility] on Tuesday.  Still waiting to get some questions answered, but no treatment going on right now.   Currently in Pain? Other (Comment)  reports left chest area of metastases is bothering her more lately.                    [facility] PT Treatment/Exercise - 05/05/14 0001    Manual Therapy   Manual Therapy Manual Lymphatic Drainage (MLD)   Manual Lymphatic Drainage (MLD) In left sidelying, posterior interaxillary anastomosis right to left and right axillo-inguinal anastomosis.  In  supine, left axilla and anterior interaxillary anastomosis, right groin and axillo-inguinal anastomosis, and right lateral breast area between and around scars, directing toward pathways.  In right sidelying, left periscapular area directing towards left groin.   Manual Therapy   Massage Left upper back in right sidelying, for pain relief and muscle relaxation.   Myofascial Release At right axilla, soft tissue mobilization and myofascial release crosshands technique.                        [facility] Goals - 05/05/14 1145    CC Long Term Goal  #1   Status On-going   CC Long Term Goal  #2   Status On-going            Plan - 05/05/14 1144    Clinical Impression Statement Moderate induration at right breast area between scars; moderate tightness/tenderness at left lateral scapular border musculature.   Pt will benefit from skilled therapeutic intervention in order to improve on the following deficits Increased edema;Pain;Impaired flexibility   Clinical Impairments Affecting Rehab Potential active cancer, with recent evidence of spread   PT Frequency 2x / week   PT Duration 12 weeks   PT Treatment/Interventions Manual lymph drainage;Manual techniques;Scar mobilization   PT Next Visit Plan Manual lymph drainage, myofascial release, soft tissue work.  Consulted and Agree with Plan of Care Patient   PT Plan Continue manual lymph drainage, soft tissue work, and myofascial release for pain and edema control.        Problem List Patient Active Problem List   Diagnosis Date Noted  . Chest wall pain 03/21/2014  . Abnormal LFTs (liver function tests) 09/12/2013  . Pleural effusion 08/18/2013  . 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 Breast cancer metastasized to multiple sites 08/18/2013    [facility] 05/05/2014, 11:46 AM  [place],  [place], [address] Phone: [phone]   Fax:  [phone]  [name] [name], [place]

## 2014-05-08 ENCOUNTER — Ambulatory Visit: Payer: 59 | Admitting: Physical Therapy

## 2014-05-08 ENCOUNTER — Other Ambulatory Visit: Payer: Self-pay

## 2014-05-08 ENCOUNTER — Encounter: Payer: Self-pay | Admitting: Thoracic Surgery (Cardiothoracic Vascular Surgery)

## 2014-05-08 ENCOUNTER — Institutional Professional Consult (permissible substitution) (INDEPENDENT_AMBULATORY_CARE_PROVIDER_SITE_OTHER): Payer: 59 | Admitting: Thoracic Surgery (Cardiothoracic Vascular Surgery)

## 2014-05-08 VITALS — Ht 63.0 in

## 2014-05-08 DIAGNOSIS — C799 Secondary malignant neoplasm of unspecified site: Secondary | ICD-10-CM

## 2014-05-08 DIAGNOSIS — C801 Malignant (primary) neoplasm, unspecified: Secondary | ICD-10-CM

## 2014-05-08 DIAGNOSIS — I890 Lymphedema, not elsewhere classified: Secondary | ICD-10-CM | POA: Diagnosis not present

## 2014-05-08 DIAGNOSIS — R0789 Other chest pain: Secondary | ICD-10-CM

## 2014-05-08 NOTE — Progress Notes (Signed)
HPI:  [name] is a 54 year old woman with metastatic breast cancer. She initially was treated with lumpectomy and sentinel node biopsy. She subsequently has had an axillary dissection. She has had progression of disease and has had multiple different chemotherapy regimens. I first saw her in March 2015. At that time she had progressive mediastinal adenopathy despite tamoxifen. There was a question as to whether this represented metastatic breast cancer or lymphoma. I did a Chamberlain procedure on her. It was in fact metastatic breast cancer. She has had additional chemotherapy regimens since that time. She has had difficulty tolerating some of the combination therapies. She's continued to have progression of disease.  She had a Pleurx catheter placed in June for a recurrent left pleural effusion. She has been draining that twice a week for about 6 months. She says that she usually drains between 250 and 450 mL at a time. Recently she's been draining about 250 mL every time. She is not draining to completion.  She was to know if debulking the tumor will improve her symptomatically, improve her survival, or improve her response to therapy.  Past Medical History  Diagnosis Date  . Seizures 2010    Isolated incident.  . Breast cancer dx'd 2005/2011  . PONV (postoperative nausea and vomiting)   . Peripheral vascular disease 02/2010    blood clot related to porta cath      Current Outpatient Prescriptions  Medication Sig Dispense Refill  . b complex vitamins capsule Take 1 capsule by mouth daily.    . Calcium-Magnesium-Vitamin D (CITRACAL CALCIUM+D PO) Take by mouth. 400mg  vitamin C, 500mg  vitamin D3 2 PO DAILY    . exemestane (AROMASIN) 25 MG tablet Take 25 mg by mouth daily after breakfast.   4  . folic acid (FOLVITE) 1 MG tablet Take 1 mg by mouth daily.    Marland Kitchen ibuprofen (ADVIL,MOTRIN) 200 MG tablet Take 200 mg by mouth every 4 (four) hours as needed.    . Melatonin 1 MG TABS Take  3 mg by mouth at bedtime as needed (sleep).     . ALPRAZolam (XANAX) 0.5 MG tablet Take 1 tablet (0.5 mg total) by mouth 2 (two) times daily as needed for anxiety. (Patient not taking: Reported on 05/08/2014) 30 tablet 0  . [DISCONTINUED] metoCLOPramide (REGLAN) 10 MG tablet Take 1 tablet (10 mg total) by mouth 4 (four) times daily -  before meals and at bedtime. 60 tablet 3   No current facility-administered medications for this visit.    Physical Exam BP   Pulse   Resp   Ht 5\' 3"  (1.6 m)  [id number]  54 year old woman in no acute distress Well-developed and well-nourished Alert and oriented 3 with no focal neurologic deficit No cervical or subclavicular adenopathy Lungs diminished at left base Cardiac regular rate and rhythm normal S1 and S2 Pleurx catheter in place on the left side. Mild tenderness to palpation along the left lateral chest wall  Diagnostic Tests: NUCLEAR MEDICINE PET SKULL BASE TO THIGH  TECHNIQUE: 9.2 mCi F-18 FDG was injected intravenously. Full-ring PET imaging was performed from the skull base to thigh after the radiotracer. CT data was obtained and used for attenuation correction and anatomic localization.  FASTING BLOOD GLUCOSE: Value: 76 mg/dl  COMPARISON: 01/25/2014.  FINDINGS: NECK  No hypermetabolic lymph nodes in the neck.  CHEST  As before, there is markedly hypermetabolic disease involving the posterior aspect of the upper left breast, anterior and medial left pleura inferiorly extensive involvement of  the posterior and lateral left pleura. Hypermetabolic lymphadenopathy is identified in subcarinal lymph nodes.  An index nodule measured in the medial aspect of the upper left breast previously had SUV max = 3.2. This same hypermetabolic nodule has SUV max = 4.7 today.  The index soft tissue measured previously as a large prevascular nodal mass or pleural-based mass in the medial aspect of the upper left hemi thorax was 6.4 x  3.1 cm previously compared to 7.3 x 3.3 cm today. Increased SUV max = 14.3 for this region today compared to 8.2 previously. The 14 mm short axis hypermetabolic subcarinal lymph node seen on the previous study is now 18 mm in short axis. SUV max = 7.4 on today's study compared to 6.1 previously.  Stable moderate left pleural effusion with left pleural drain still visualized in-situ.  ABDOMEN/PELVIS  The previously characterized soft tissue nodularity along the medial aspect of the left hemi diaphragmatic crus measures 2.4 x 1.4 cm today compared to 2.3 x 1.5 cm previously. This hypermetabolic tissue has SUV max = 9.3 today compared to 7.0 previously.  Left para-aortic hypermetabolic lymph nodes are not substantially changed, with the more lateral of these 2 lymph nodes measuring 9 mm in short axis today compared 8 mm previously.  SKELETON  No focal hypermetabolic activity to suggest skeletal metastasis.  IMPRESSION: As before, there is extensive abnormal soft tissue metastases in the left chest wall and almost circumferentially along the left pleura. There is associated hypermetabolic lymphadenopathy in the lower mediastinum, adjacent to the left inferior and medial diaphragmatic crus, and in the left para-aortic space the upper abdomen. Soft tissue measurements indicate continued slight progression of this disease and FDG accumulation within the metastatic disease has increased at all measured sites.   Electronically Signed  By: Misty Stanley M.D.  On: 03/29/2014 16:16  I personally reviewed the PET/CT and base my recommendations off my reading.  Impression: 54 year old woman with metastatic breast cancer with malignant left pleural effusion. She has had progression of disease. There are extensive chest wall and pleural metastases. She has had new tumor growth in the left anterior mediastinum in the region of the previous [place] procedure. She has a  malignant pleural effusion with a Pleurx catheter in place.  1. Her primary hope was that some type of debulking operation would improve her survival or improve her response to chemotherapy. I told her that there really is no role for debulking procedure in the setting. There is no benefit in terms of survival or response to therapy. It would be a lot of surgical risk for no gain  2. Regarding her pleural catheter. It is been in for about 8 months now and she has not achieved pleural symphysis. However, the catheter has not been drained to completion. She is only been draining around 250 mL at a time twice a week. This is giving her good palliation from her shortness of breath. And one option would be to continue to drain this as she's been doing. However, she likely will be draining that for the rest of her life if done in that fashion. I talked about other options including increasing the amount of drainage, talc pleurodesis, or VATS for talc pleurodesis.   Unfortunately, I think it would be very difficult to do a VATS on her given the bulky pleural and chest wall involvement. I would be very concerned about her ability to heal any incisions and the chest wall.  We talked about doing a top pleurodesis  via the pleural catheter. She is reluctant to try that because of potential pain. She is also concerned about the possibility of having a loculated pleural effusion, which is a possibility.  I think since she has only been draining partially, the appropriate first step would be to have her drain the catheter to completion. I cautioned her that this is going to be painful at first, but should improve with time. We will continue twice a week for now, however if she drains more than 500 ml in a single session we will go to 3 times weekly. In most cases if we can keep the pleural surfaces in apposition pleural symphysis can be achieved. I am concerned that since she's had the effusion for several long that  there may be some element of trapping of the left lower lobe which might prevent that in her case.    Plan: She will drain the pleural catheter to completion twice weekly. If she has over 500 mL at a time we will go to 3 times weekly. I will then see her back in 3 weeks with a PA and lateral chest x-ray to assess the effusion.  I spent 30 minutes with Mrs. Low and her husband reviewing the films and discussing the natural history of disease and treatment options.  Revonda Standard [name], MD

## 2014-05-08 NOTE — Therapy (Signed)
[place], [place], [address] Phone: [phone]   Fax:  [phone]  Physical Therapy Treatment  Patient Details  Name: [name] PIGGOTT MRN: [id number] Date of Birth: 03/18/1961 Referring Provider:  Deatra Robinson, MD  Encounter Date: 05/08/2014      PT End of Session - 05/08/14 1246    Visit Number 12   Number of Visits 25   Date for PT Re-Evaluation 06/16/14   PT Start Time 1020   PT Stop Time 1105   PT Time Calculation (min) 45 min      Past Medical History  Diagnosis Date  . Seizures 2010    Isolated incident.  . Breast cancer dx'd 2005/2011  . PONV (postoperative nausea and vomiting)   . Peripheral vascular disease 02/2010    blood clot related to porta cath    Past Surgical History  Procedure Laterality Date  . Breast lumpectomy  2005  . Axillary lymph node dissection  Dec. 2011  . Portacath placement  12/11  . Removal portacath    . Mediastinotomy chamberlain mcneil Left 06/02/2013    Procedure: MEDIASTINOTOMY CHAMBERLAIN MCNEIL;  Surgeon: [name], MD;  Location: [facility];  Service: Thoracic;  Laterality: Left;  LEFT ANTERIOR MEDIASTINOTOMY     There were no vitals taken for this visit.  Visit Diagnosis:  Lymphedema  Other chest pain      Subjective Assessment - 05/08/14 1021    Symptoms Having a lot of left side pain; started Saturday and got progressively worse.  Couldn't sleep last night.  Feels full; feels tight also at right breast.   Pertinent History Saw Dr. Jana Hakim on Friday; will see Dr. [name] (thoracic surgeon) today.  Feeling unsure about decisions regarding treatment going forward.   Currently in Pain? Yes   Pain Score 8    Pain Location Flank   Pain Orientation Left   Pain Descriptors / Indicators Other (Comment)  full                    [facility] Adult PT Treatment/Exercise - 05/08/14 0001    Manual Therapy   Manual  Lymphatic Drainage (MLD) In left sidelying, posterior interaxillary anastomosis right to left and right axillo-inguinal anastomosis.  In supine, short neck, left axilla and anterior interaxillary anastomosis, right groin and axillo-inguinal anastomosis, and area at right lateral breast around scars, directing toward pathways.  In right sidelying, left periscapular area toward left groin.   Manual Therapy   Massage Left upper back in right sidelying using hand cream today.   Myofascial Release At right axilla, soft tissue mobilization and myofascial release.                        [facility] Goals - 05/05/14 1145    CC Long Term Goal  #1   Status On-going   CC Long Term Goal  #2   Status On-going            Plan - 05/08/14 1246    Clinical Impression Statement Again moderate induration at right breast scar area and moderate fullness at left scapular area.   Pt will benefit from skilled therapeutic intervention in order to improve on the following deficits Increased edema;Pain;Impaired flexibility   Rehab Potential Good   Clinical Impairments Affecting Rehab Potential active cancer, with recent evidence of spread   PT Next Visit Plan Manual lymph drainage, myofascial release, soft tissue  work.   Oncologist with Plan of Care Patient        Problem List Patient Active Problem List   Diagnosis Date Noted  . Chest wall pain 03/21/2014  . Abnormal LFTs (liver function tests) 09/12/2013  . Pleural effusion 08/18/2013  . 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 Breast cancer metastasized to multiple sites 08/18/2013    [facility] 05/08/2014, 12:47 PM  [place], [place], [address] Phone: [phone]   Fax:  [phone]  [name], [place]

## 2014-05-09 ENCOUNTER — Ambulatory Visit
Admission: RE | Admit: 2014-05-09 | Discharge: 2014-05-09 | Disposition: A | Discharge status: Home / Self Care | Payer: Self-pay | Source: Ambulatory Visit | Attending: Radiation Oncology | Admitting: Radiation Oncology

## 2014-05-09 ENCOUNTER — Other Ambulatory Visit: Payer: Self-pay | Admitting: Radiation Oncology

## 2014-05-09 DIAGNOSIS — C50919 Malignant neoplasm of unspecified site of unspecified female breast: Secondary | ICD-10-CM

## 2014-05-10 ENCOUNTER — Telehealth: Payer: Self-pay | Admitting: Oncology

## 2014-05-10 ENCOUNTER — Other Ambulatory Visit: Payer: Self-pay | Admitting: *Deleted

## 2014-05-10 ENCOUNTER — Telehealth: Payer: Self-pay | Admitting: *Deleted

## 2014-05-10 ENCOUNTER — Ambulatory Visit (HOSPITAL_COMMUNITY)
Admission: RE | Admit: 2014-05-10 | Discharge: 2014-05-10 | Disposition: A | Discharge status: Home / Self Care | Payer: 59 | Source: Ambulatory Visit | Attending: Oncology | Admitting: Oncology

## 2014-05-10 DIAGNOSIS — C50211 Malignant neoplasm of upper-inner quadrant of right female breast: Secondary | ICD-10-CM

## 2014-05-10 DIAGNOSIS — J90 Pleural effusion, not elsewhere classified: Secondary | ICD-10-CM | POA: Diagnosis not present

## 2014-05-10 DIAGNOSIS — R079 Chest pain, unspecified: Secondary | ICD-10-CM | POA: Insufficient documentation

## 2014-05-10 DIAGNOSIS — C50919 Malignant neoplasm of unspecified site of unspecified female breast: Secondary | ICD-10-CM

## 2014-05-10 NOTE — Telephone Encounter (Signed)
Complaining of progressive pain in back since Saturday.  Kim with [facility] has called as well.  Need to know if we are planning to do a CXR and if so, does she need to be drained.  I will place a call to [facility] for instructions.

## 2014-05-10 NOTE — Telephone Encounter (Signed)
This RN spoke with pt post MD review of CXR and patient concerns.  Recommendation is for pt to have CT of chest now vs early March for evaluation.  Dr Valere Dross will be consulted for possible benefit of radiation therapy per pt's inquiry as well.  At present [name] [name] states pain is not as bad - " still very tight and if I move in certain ways I can get a severe twinge "  Pt developed nausea post use of Oxy IR and will resume use of motrin for pain control.  This RN placed urgent POF requesting CT to be rescheduled to ASAP.

## 2014-05-10 NOTE — Telephone Encounter (Signed)
This RN called pt per request by [facility].  [name] [name] states pain is in " mid back ", "like a pocket of fluid ".  [name] [name] noticed the pain occurring on Saturday but has been using motrin with benefit.  Pain is continuing and increasing- was not improved overall with drainage on Monday of 250cc from PleurX.  Early this AM [name] [name] woke with " horrific nausea- heaving "- and pain"  [name] [name] managed the above including taking motrin- with minimal relief.  Pain increases post morning activities - [name] [name] contacted [name] [name] for drainage and called this office.  Of note at present [name] [name] has taken 2.5mg  of oxycodone due to ongoing pain.  Per discussion plan is [name] [name] will proceed with drainage- and then come in for CXR and review of results with this RN.  This RN then contacted [name] [name] at [phone] ext [phone] and discussed above - RN has been contacted to proceed with drainage today ASAP.  MD aware of above and in agreement of plan.

## 2014-05-10 NOTE — Telephone Encounter (Signed)
Phone call to [facility], she will follow up with patient.

## 2014-05-11 ENCOUNTER — Other Ambulatory Visit: Payer: Self-pay | Admitting: Radiation Oncology

## 2014-05-11 ENCOUNTER — Ambulatory Visit
Admission: RE | Admit: 2014-05-11 | Discharge: 2014-05-11 | Disposition: A | Discharge status: Home / Self Care | Payer: Self-pay | Source: Ambulatory Visit | Attending: Radiation Oncology | Admitting: Radiation Oncology

## 2014-05-11 ENCOUNTER — Telehealth: Payer: Self-pay | Admitting: *Deleted

## 2014-05-11 ENCOUNTER — Telehealth: Payer: Self-pay | Admitting: Oncology

## 2014-05-11 ENCOUNTER — Other Ambulatory Visit: Payer: Self-pay | Admitting: *Deleted

## 2014-05-11 DIAGNOSIS — C50919 Malignant neoplasm of unspecified site of unspecified female breast: Secondary | ICD-10-CM

## 2014-05-11 NOTE — Telephone Encounter (Signed)
pt cld in re to inf & lab being moved-adv she will need to spk to Val-trans to [facility]

## 2014-05-11 NOTE — Telephone Encounter (Signed)
Per desk RN I have moved appt from 3/1 to Monday

## 2014-05-12 ENCOUNTER — Ambulatory Visit: Payer: 59 | Admitting: Physical Therapy

## 2014-05-12 ENCOUNTER — Telehealth: Payer: Self-pay | Admitting: *Deleted

## 2014-05-12 DIAGNOSIS — I890 Lymphedema, not elsewhere classified: Secondary | ICD-10-CM | POA: Diagnosis not present

## 2014-05-12 DIAGNOSIS — M6289 Other specified disorders of muscle: Secondary | ICD-10-CM

## 2014-05-12 DIAGNOSIS — R0789 Other chest pain: Secondary | ICD-10-CM

## 2014-05-12 NOTE — Therapy (Signed)
[place], [place], [address] Phone: [phone]   Fax:  [phone]  Physical Therapy Treatment  Patient Details  Name: [name] [name] MRN: [id number] Date of Birth: 1960/08/27 Referring Provider:  Deatra Robinson, MD  Encounter Date: 05/12/2014      PT End of Session - 05/12/14 1234    Visit Number 13   Number of Visits 25   Date for PT Re-Evaluation 06/16/14   PT Start Time 1025   PT Stop Time 1107   PT Time Calculation (min) 42 min   Activity Tolerance Patient tolerated treatment well   Behavior During Therapy [facility] for tasks assessed/performed      Past Medical History  Diagnosis Date  . Seizures 2010    Isolated incident.  . Breast cancer dx'd 2005/2011  . PONV (postoperative nausea and vomiting)   . Peripheral vascular disease 02/2010    blood clot related to porta cath    Past Surgical History  Procedure Laterality Date  . Breast lumpectomy  2005  . Axillary lymph node dissection  Dec. 2011  . Portacath placement  12/11  . Removal portacath    . Mediastinotomy chamberlain mcneil Left 06/02/2013    Procedure: MEDIASTINOTOMY CHAMBERLAIN MCNEIL;  Surgeon: [name], MD;  Location: [facility];  Service: Thoracic;  Laterality: Left;  LEFT ANTERIOR MEDIASTINOTOMY     There were no vitals taken for this visit.  Visit Diagnosis:  Lymphedema  Other chest pain  Muscle stiffness      Subjective Assessment - 05/12/14 1026    Symptoms "I've got a tale of woe for you."   Currently in Pain? Yes   Pain Score 3    Pain Location Flank   Pain Orientation Left   Pain Relieving Factors Motrin                    [facility] Adult PT Treatment/Exercise - 05/12/14 0001    Manual Therapy   Manual Lymphatic Drainage (MLD) In left sidelying, posterior interaxillary anastomosis right to left and right axillo-inguinal anastomosis. In supine, short neck, left axilla and  anterior interaxillary anastomosis, right groin and axillo-inguinal anastomosis, and area at right lateral breast around scars, directing toward pathways. In right sidelying, left periscapular area toward left groin.   Manual Therapy   Massage Soft tissue work at left upper back for muscle relaxation performed in right sidelying.   Myofascial Release At right axillary scar with soft tissue mobilization and myofascial release                        [facility] Goals - 05/12/14 1237    CC Long Term Goal  #1   Status On-going   CC Long Term Goal  #2   Status On-going            Plan - 05/12/14 1234    Clinical Impression Statement Induration at right breast scar area less today; patient with new complaints today about two areas at left flank bothering her; also with concerns related to information about her PleurX tube and that she learned something different from the thoracic surgeon about how that should be drained.   Pt will benefit from skilled therapeutic intervention in order to improve on the following deficits Increased edema;Pain;Impaired flexibility   Rehab Potential Good   Clinical Impairments Affecting Rehab Potential active cancer, with recent evidence of spread   PT Frequency 2x /  week   PT Duration 12 weeks   PT Treatment/Interventions Manual lymph drainage;Manual techniques;Scar mobilization   PT Next Visit Plan Manual lymph drainage, myofascial release, soft tissue work.   Consulted and Agree with Plan of Care Patient        Problem List Patient Active Problem List   Diagnosis Date Noted  . Chest wall pain 03/21/2014  . Abnormal LFTs (liver function tests) 09/12/2013  . Pleural effusion 08/18/2013  . 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 Breast cancer metastasized to multiple sites 08/18/2013    [facility] 05/12/2014, 12:38 PM  [place], [place], [address] Phone: [phone]   Fax:  [phone]  [name], [place]

## 2014-05-12 NOTE — Telephone Encounter (Signed)
Patient called and she would like to speak to Marlon Pel RN with Dr. Jana Hakim.  Her pain in her back has returned.  She is having her pleurex catheter drained at around 11:30am this morning.  She would like Val Dodd to call her around 3pm this afternoon.  Will route this to [employer] RN

## 2014-05-15 ENCOUNTER — Ambulatory Visit: Payer: 59 | Admitting: Physical Therapy

## 2014-05-15 ENCOUNTER — Encounter (HOSPITAL_COMMUNITY): Payer: Self-pay

## 2014-05-15 ENCOUNTER — Ambulatory Visit (HOSPITAL_COMMUNITY)
Admission: RE | Admit: 2014-05-15 | Discharge: 2014-05-15 | Disposition: A | Discharge status: Home / Self Care | Payer: 59 | Source: Ambulatory Visit | Attending: Oncology | Admitting: Oncology

## 2014-05-15 ENCOUNTER — Ambulatory Visit: Payer: 59

## 2014-05-15 ENCOUNTER — Other Ambulatory Visit: Payer: Self-pay

## 2014-05-15 DIAGNOSIS — C50912 Malignant neoplasm of unspecified site of left female breast: Secondary | ICD-10-CM | POA: Diagnosis present

## 2014-05-15 MED ORDER — IOHEXOL 300 MG/ML  SOLN
80.0000 mL | Freq: Once | INTRAMUSCULAR | Status: AC | PRN
  Administered 2014-05-15: 80 mL via INTRAVENOUS

## 2014-05-16 ENCOUNTER — Telehealth: Payer: Self-pay | Admitting: *Deleted

## 2014-05-16 ENCOUNTER — Ambulatory Visit: Payer: 59 | Admitting: Physical Therapy

## 2014-05-16 ENCOUNTER — Telehealth: Payer: Self-pay | Admitting: Radiation Oncology

## 2014-05-16 DIAGNOSIS — I890 Lymphedema, not elsewhere classified: Secondary | ICD-10-CM | POA: Diagnosis not present

## 2014-05-16 DIAGNOSIS — R0789 Other chest pain: Secondary | ICD-10-CM

## 2014-05-16 DIAGNOSIS — M6289 Other specified disorders of muscle: Secondary | ICD-10-CM

## 2014-05-16 NOTE — Therapy (Signed)
[place], [place], [address] Phone: [phone]   Fax:  [phone]  Physical Therapy Treatment  Patient Details  Name: [name] [name] MRN: [id number] Date of Birth: [birth year] Referring Provider:  Deatra Robinson, MD  Encounter Date: 05/16/2014      PT End of Session - 05/16/14 1750    Visit Number 14   Number of Visits 25   Date for PT Re-Evaluation 06/16/14   PT Start Time 1520   PT Stop Time 1602   PT Time Calculation (min) 42 min   Activity Tolerance Patient tolerated treatment well   Behavior During Therapy [facility] for tasks assessed/performed      Past Medical History  Diagnosis Date  . Seizures 2010    Isolated incident.  . Breast cancer dx'd 2005/2011  . PONV (postoperative nausea and vomiting)   . Peripheral vascular disease 02/2010    blood clot related to porta cath    Past Surgical History  Procedure Laterality Date  . Breast lumpectomy  2005  . Axillary lymph node dissection  Dec. 2011  . Portacath placement  12/11  . Removal portacath    . Mediastinotomy chamberlain mcneil Left 06/02/2013    Procedure: MEDIASTINOTOMY CHAMBERLAIN MCNEIL;  Surgeon: [name], MD;  Location: [facility];  Service: Thoracic;  Laterality: Left;  LEFT ANTERIOR MEDIASTINOTOMY     There were no vitals taken for this visit.  Visit Diagnosis:  Lymphedema  Other chest pain  Muscle stiffness      Subjective Assessment - 05/16/14 1748    Symptoms Patient comes in today happy to report that her CT scan showed no apparent increase in disease activity from the previous one.   Currently in Pain? Yes   Pain Score 3    Pain Location Axilla   Pain Orientation Right   Aggravating Factors  swelling   Pain Relieving Factors manual lymph drainage                    [facility] Adult PT Treatment/Exercise - 05/16/14 0001    Manual Therapy   Manual Lymphatic Drainage (MLD) In left  sidelying, posterior interaxillary anastomosis right to left and right axillo-inguinal anastomosis. In supine, left axilla and anterior interaxillary anastomosis, right groin and axillo-inguinal anastomosis, and right lateral breast area between and around scars, directing toward pathways. In right sidelying, left periscapular area directing towards left groin.   Manual Therapy   Massage Soft tissue work at left upper back for muscle relaxation performed in right sidelying.   Myofascial Release At right axillary scar with soft tissue mobilization and myofascial release.                        [facility] Goals - 05/12/14 1237    CC Long Term Goal  #1   Status On-going   CC Long Term Goal  #2   Status On-going            Plan - 05/16/14 1751    Clinical Impression Statement Moderate induration at right breast scar area; left periscapular musculature with several areas of tightness palpable.  Pt. reports relief with treatment (not 100%, but significant relief).   Pt will benefit from skilled therapeutic intervention in order to improve on the following deficits Increased edema;Pain;Impaired flexibility   Rehab Potential Good   PT Frequency 2x / week   PT Duration 12 weeks   PT Treatment/Interventions  Manual lymph drainage;Manual techniques;Scar mobilization   PT Next Visit Plan Manual lymph drainage, myofascial release, soft tissue work.   Consulted and Agree with Plan of Care Patient   PT Plan Continue manual lymph drainage, soft tissue work, and myofascial release for pain and edema control.        Problem List Patient Active Problem List   Diagnosis Date Noted  . Chest wall pain 03/21/2014  . Abnormal LFTs (liver function tests) 09/12/2013  . Pleural effusion 08/18/2013  . 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 Breast cancer metastasized to multiple sites 08/18/2013    [facility] 05/16/2014, 5:53 PM  [place], [place], [address] Phone: [phone]   Fax:  [phone]   [name], [place]

## 2014-05-16 NOTE — Telephone Encounter (Signed)
This RN spoke with pt per results of CT scan obtained early due to new onset of pain last week ( prior to increasing drainage from [facility] catheter ).  Post MD review this RN contacted pt and informed her of stable results.  Gayl states pain that occurred 2 x last week has now subsided.  Her concerns at present are :  1.PlureX catheter " should come out - it has been in there a long time    and I am concerned that my body is getting dependent on it vs     trying to reabsorb the fluid on it's own "  2. " there seems to be an area in my lower lung that the PlureX       cannot drain- what or how do we get that fluid out ?"      " is my chest so full of tumor that something is blocking that area         and not letting it drain well ?  3. " I would like for Dr [name], Dr Valere Dross and Dr Jana Hakim to        communicate with each other regarding how to best manage the       Above."  This RN informed pt her inquiries above will be sent to requested MDs for follow up.  Pt has no further needs at this time.

## 2014-05-16 NOTE — Telephone Encounter (Signed)
I spoke with the patient by phone this evening after reviewing her CAT scan from yesterday.  For the most part, her disease appears to be stable.  I explained to her that any radiation therapy is likely to cause some degree of fibrosis surrounding the target region and I would certainly hold off on radiation therapy at this point in time.  She will see Dr. [name] for a follow-up visit in early March.

## 2014-05-18 ENCOUNTER — Encounter (HOSPITAL_COMMUNITY): Payer: Self-pay

## 2014-05-19 ENCOUNTER — Ambulatory Visit: Payer: 59 | Admitting: Physical Therapy

## 2014-05-19 DIAGNOSIS — I890 Lymphedema, not elsewhere classified: Secondary | ICD-10-CM

## 2014-05-19 DIAGNOSIS — R0789 Other chest pain: Secondary | ICD-10-CM

## 2014-05-19 DIAGNOSIS — M6289 Other specified disorders of muscle: Secondary | ICD-10-CM

## 2014-05-19 NOTE — Therapy (Signed)
[place], [place], [address] Phone: [phone]   Fax:  [phone]  Physical Therapy Treatment  Patient Details  Name: [name] [name] MRN: [id number] Date of Birth: 02-06-1961 Referring Provider:  Deatra Robinson, MD  Encounter Date: 05/19/2014      PT End of Session - 05/19/14 1257    Visit Number 15   Number of Visits 25   Date for PT Re-Evaluation 06/16/14   PT Start Time 1020   PT Stop Time 1105   PT Time Calculation (min) 45 min   Activity Tolerance Patient tolerated treatment well   Behavior During Therapy [facility] for tasks assessed/performed      Past Medical History  Diagnosis Date  . Seizures 2010    Isolated incident.  . Breast cancer dx'd 2005/2011  . PONV (postoperative nausea and vomiting)   . Peripheral vascular disease 02/2010    blood clot related to porta cath    Past Surgical History  Procedure Laterality Date  . Breast lumpectomy  2005  . Axillary lymph node dissection  Dec. 2011  . Portacath placement  12/11  . Removal portacath    . Mediastinotomy chamberlain mcneil Left 06/02/2013    Procedure: MEDIASTINOTOMY CHAMBERLAIN MCNEIL;  Surgeon: [name], MD;  Location: [place];  Service: Thoracic;  Laterality: Left;  LEFT ANTERIOR MEDIASTINOTOMY     There were no vitals taken for this visit.  Visit Diagnosis:  Lymphedema  Other chest pain  Muscle stiffness      Subjective Assessment - 05/19/14 1023    Currently in Pain? Yes   Pain Score 2    Pain Location Flank   Pain Orientation Left   Aggravating Factors  getting PleurX tube drained   Multiple Pain Sites Yes   Pain Score 3   Pain Location Axilla   Pain Orientation Right                    [facility] Adult PT Treatment/Exercise - 05/19/14 0001    Manual Therapy   Manual Lymphatic Drainage (MLD) In left sidelying, posterior interaxillary anastomosis right to left and right  axillo-inguinal anastomosis. In supine, left axilla and anterior interaxillary anastomosis, right groin and axillo-inguinal anastomosis, and right lateral breast area between and around scars, directing toward pathways. In right sidelying, left periscapular area directing towards left groin.In left sidelying, posterior interaxillary anastomosis right to left and right axillo-inguinal anastomosis. In supine, left axilla and anterior interaxillary anastomosis, right groin and axillo-inguinal anastomosis, and right lateral breast area between and around scars, directing toward pathways; also right upper arm. In right sidelying, left periscapular area directing towards left groin.   Manual Therapy   Massage Soft tissue work at left upper back for muscle relaxation performed in right sidelying.   Myofascial Release At right axillary scar with soft tissue mobilization and myofascial release.                        [facility] Goals - 05/19/14 1259    CC Long Term Goal  #1   Title Reports left upper back and left side pain is controlled at 4/10 or less with therapy at the current frequency.   Baseline Pain has been controlled at this level recently (05/19/14); goal is ongoing.   Status On-going   CC Long Term Goal  #2   Title Pt. will report swelling is adequately managed to enable ADL function  at a consistent level.   Baseline Swelling has been controlled adequately with 2x/week therapy; goal is ongoing.   Status On-going            Plan - 05/19/14 1258    Clinical Impression Statement Less induration at right breast scar area; left scapular area musculature feels tight today.   Pt will benefit from skilled therapeutic intervention in order to improve on the following deficits Increased edema;Pain;Impaired flexibility   Rehab Potential Good   Clinical Impairments Affecting Rehab Potential active cancer, with recent evidence of spread   PT Frequency 2x / week   PT Duration  12 weeks   PT Treatment/Interventions Manual lymph drainage;Manual techniques;Scar mobilization   PT Next Visit Plan Manual lymph drainage, myofascial release, soft tissue work.   Consulted and Agree with Plan of Care Patient        Problem List Patient Active Problem List   Diagnosis Date Noted  . Chest wall pain 03/21/2014  . Abnormal LFTs (liver function tests) 09/12/2013  . Pleural effusion 08/18/2013  . 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 Breast cancer metastasized to multiple sites 08/18/2013    [place] 05/19/2014, 1:04 PM  [place], [place], [address] Phone: [phone]   Fax:  [phone]  [name] [name], [place]

## 2014-05-22 ENCOUNTER — Ambulatory Visit (HOSPITAL_BASED_OUTPATIENT_CLINIC_OR_DEPARTMENT_OTHER): Payer: 59 | Admitting: Oncology

## 2014-05-22 ENCOUNTER — Ambulatory Visit (HOSPITAL_COMMUNITY): Payer: 59

## 2014-05-22 ENCOUNTER — Other Ambulatory Visit: Payer: Self-pay

## 2014-05-22 ENCOUNTER — Other Ambulatory Visit: Payer: Self-pay | Admitting: *Deleted

## 2014-05-22 ENCOUNTER — Ambulatory Visit: Payer: 59 | Admitting: Physical Therapy

## 2014-05-22 DIAGNOSIS — C50919 Malignant neoplasm of unspecified site of unspecified female breast: Secondary | ICD-10-CM

## 2014-05-22 DIAGNOSIS — I890 Lymphedema, not elsewhere classified: Secondary | ICD-10-CM

## 2014-05-22 DIAGNOSIS — R0789 Other chest pain: Secondary | ICD-10-CM

## 2014-05-22 DIAGNOSIS — M6289 Other specified disorders of muscle: Secondary | ICD-10-CM

## 2014-05-22 DIAGNOSIS — R21 Rash and other nonspecific skin eruption: Secondary | ICD-10-CM

## 2014-05-22 NOTE — Progress Notes (Signed)
[name] [name] called in today complaining of a red rash on the back of her left knee. We thought she might have had a Baker's cyst rupture or possibly a DVT and set her up for a vascular study later on today. However on exam what she has is a very focal superficial lesion, more like a burn than anything else, flat but palpable, minimally erythematous, and slightly scaly.       I do not think this is going to be related to her exemestane. I suggested she treat it with hydrocortisone cream and possibly ice and drop by in a couple of days to make sure that it is resolving. In the absence of any swelling and given that this is entirely superficial we went ahead and cancelled the vascular study.  [name] [name] knows to call for any problems that may develop before her next visit here.

## 2014-05-22 NOTE — Therapy (Signed)
[place], [place], [address] Phone: [phone]   Fax:  [phone]  Physical Therapy Treatment  Patient Details  Name: [name] [name] MRN: [id number] Date of Birth: 1960/12/21 Referring Provider:  Deatra Robinson, MD  Encounter Date: 05/22/2014      PT End of Session - 05/22/14 1247    Visit Number 16   Number of Visits 25   Date for PT Re-Evaluation 06/16/14   PT Start Time 1021   PT Stop Time 1104   PT Time Calculation (min) 43 min      Past Medical History  Diagnosis Date  . Seizures 2010    Isolated incident.  . Breast cancer dx'd 2005/2011  . PONV (postoperative nausea and vomiting)   . Peripheral vascular disease 02/2010    blood clot related to porta cath    Past Surgical History  Procedure Laterality Date  . Breast lumpectomy  2005  . Axillary lymph node dissection  Dec. 2011  . Portacath placement  12/11  . Removal portacath    . Mediastinotomy chamberlain mcneil Left 06/02/2013    Procedure: MEDIASTINOTOMY CHAMBERLAIN MCNEIL;  Surgeon: [name], MD;  Location: [place] [place];  Service: Thoracic;  Laterality: Left;  LEFT ANTERIOR MEDIASTINOTOMY     There were no vitals taken for this visit.  Visit Diagnosis:  Lymphedema  Other chest pain  Muscle stiffness      Subjective Assessment - 05/22/14 1025    Symptoms Have an itchy throat and patchy rash behind left knee.   Currently in Pain? Yes   Pain Score 4    Pain Location Axilla   Pain Orientation Right  and left flank   Pain Descriptors / Indicators Other (Comment)  full                    [facility] Adult PT Treatment/Exercise - 05/22/14 0001    Manual Therapy   Manual Lymphatic Drainage (MLD) In left sidelying, posterior interaxillary anastomosis and right axillo-inguinal anastomosis; in supine, short neck, left axilla and anterior interaxillary anastomosis, right groin and axillo-inguinal  anastomosis, area between right breast scars, directing toward pathways, and right upper arm.  In right sidelying, left periscapular area toward left groin.   Manual Therapy   Massage in right sidelying, to left upper back and neck.   Myofascial Release soft tissue mobilization right axilla                        [facility] Goals - 05/22/14 1248    CC Long Term Goal  #1   Status On-going   CC Long Term Goal  #2   Status On-going            Plan - 05/22/14 1247    Clinical Impression Statement Moderate fullness and firmness today at both right breast scar area and left scapular area.   Pt will benefit from skilled therapeutic intervention in order to improve on the following deficits Increased edema;Pain;Impaired flexibility   Rehab Potential Good   Clinical Impairments Affecting Rehab Potential active cancer, with recent evidence of spread   PT Frequency 2x / week   PT Duration 12 weeks   PT Treatment/Interventions Manual lymph drainage;Manual techniques;Scar mobilization   PT Next Visit Plan Manual lymph drainage, myofascial release, soft tissue work.   Consulted and Agree with Plan of Care Patient        Problem List Patient  Active Problem List   Diagnosis Date Noted  . Chest wall pain 03/21/2014  . Abnormal LFTs (liver function tests) 09/12/2013  . Pleural effusion 08/18/2013  . 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 Breast cancer metastasized to multiple sites 08/18/2013    [facility] 05/22/2014, 12:49 PM  [place], [place], [address] Phone: [phone]   Fax:  [phone]   [name], [place]

## 2014-05-24 ENCOUNTER — Encounter: Payer: Self-pay | Admitting: Physical Therapy

## 2014-05-26 ENCOUNTER — Ambulatory Visit: Payer: 59 | Admitting: Physical Therapy

## 2014-05-26 ENCOUNTER — Other Ambulatory Visit: Payer: Self-pay | Admitting: Thoracic Surgery (Cardiothoracic Vascular Surgery)

## 2014-05-26 DIAGNOSIS — J90 Pleural effusion, not elsewhere classified: Secondary | ICD-10-CM

## 2014-05-26 DIAGNOSIS — R0789 Other chest pain: Secondary | ICD-10-CM

## 2014-05-26 DIAGNOSIS — M6289 Other specified disorders of muscle: Secondary | ICD-10-CM

## 2014-05-26 DIAGNOSIS — I890 Lymphedema, not elsewhere classified: Secondary | ICD-10-CM | POA: Diagnosis not present

## 2014-05-26 NOTE — Therapy (Signed)
[place], [place], [address] Phone: [phone]   Fax:  [phone]  Physical Therapy Treatment  Patient Details  Name: [name] [name] MRN: [id number] Date of Birth: [birth year] Referring Provider:  Deatra Robinson, MD  Encounter Date: 05/26/2014      PT End of Session - 05/26/14 1226    PT Start Time 1601   PT Stop Time 1058   PT Time Calculation (min) 43 min      Past Medical History  Diagnosis Date  . Seizures 2010    Isolated incident.  . Breast cancer dx'd 2005/2011  . PONV (postoperative nausea and vomiting)   . Peripheral vascular disease 02/2010    blood clot related to porta cath    Past Surgical History  Procedure Laterality Date  . Breast lumpectomy  2005  . Axillary lymph node dissection  Dec. 2011  . Portacath placement  12/11  . Removal portacath    . Mediastinotomy chamberlain mcneil Left 06/02/2013    Procedure: MEDIASTINOTOMY CHAMBERLAIN MCNEIL;  Surgeon: [name], MD;  Location: [facility];  Service: Thoracic;  Laterality: Left;  LEFT ANTERIOR MEDIASTINOTOMY     There were no vitals taken for this visit.  Visit Diagnosis:  Lymphedema  Other chest pain  Muscle stiffness      Subjective Assessment - 05/26/14 1210    Symptoms Pt reports fullness in right shoulder,axilla, upper an lateral chest that is improved with manual lymph drainage.   Currently in Pain? Yes   Pain Score 4    Pain Location Axilla                    [facility] Adult PT Treatment/Exercise - 05/26/14 0001    Manual Therapy   Manual Therapy Manual Lymphatic Drainage (MLD)   Manual Lymphatic Drainage (MLD) in left sidelying, posterior interaxiallary anasamosis and right axillo-inguinal anastamosis, in supine rigtht inguinal nodes and  left axillary nodes and anterior interaxillary anastamoses and right axillo-inguinal anastamosis, right upper arm , axilla and lateral chest,     Manual Therapy   Massage in right sidelying, to upper back and interscapular area   Myofascial Release at right axilla around scars                        [facility] Goals - 05/22/14 1248    CC Long Term Goal  #1   Status On-going   CC Long Term Goal  #2   Status On-going            Plan - 05/26/14 1224    Clinical Impression Statement Firmness at right axilla and upper chest and back felt to be dreased after treatment   PT Next Visit Plan Manual lymph drainage, myofascial release, soft tissue work.        Problem List Patient Active Problem List   Diagnosis Date Noted  . Chest wall pain 03/21/2014  . Abnormal LFTs (liver function tests) 09/12/2013  . Pleural effusion 08/18/2013  . 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 Breast cancer metastasized to multiple sites 08/18/2013   Donato Heinz. Owens Shark, PT  05/26/2014, 12:28 PM  [place], [place], [address] Phone: [phone]   Fax:  [phone]

## 2014-05-30 ENCOUNTER — Ambulatory Visit
Admission: RE | Admit: 2014-05-30 | Discharge: 2014-05-30 | Disposition: A | Discharge status: Home / Self Care | Payer: 59 | Source: Ambulatory Visit | Attending: Thoracic Surgery (Cardiothoracic Vascular Surgery) | Admitting: Thoracic Surgery (Cardiothoracic Vascular Surgery)

## 2014-05-30 ENCOUNTER — Ambulatory Visit (HOSPITAL_COMMUNITY): Payer: 59

## 2014-05-30 ENCOUNTER — Other Ambulatory Visit: Payer: Self-pay

## 2014-05-30 ENCOUNTER — Ambulatory Visit: Payer: Self-pay

## 2014-05-30 ENCOUNTER — Other Ambulatory Visit (HOSPITAL_BASED_OUTPATIENT_CLINIC_OR_DEPARTMENT_OTHER): Payer: 59

## 2014-05-30 ENCOUNTER — Ambulatory Visit: Payer: 59 | Attending: Oncology | Admitting: Physical Therapy

## 2014-05-30 ENCOUNTER — Ambulatory Visit (INDEPENDENT_AMBULATORY_CARE_PROVIDER_SITE_OTHER): Payer: 59 | Admitting: Thoracic Surgery (Cardiothoracic Vascular Surgery)

## 2014-05-30 VITALS — Ht 63.0 in | Wt 140.0 lb

## 2014-05-30 DIAGNOSIS — C50211 Malignant neoplasm of upper-inner quadrant of right female breast: Secondary | ICD-10-CM

## 2014-05-30 DIAGNOSIS — C801 Malignant (primary) neoplasm, unspecified: Secondary | ICD-10-CM

## 2014-05-30 DIAGNOSIS — J90 Pleural effusion, not elsewhere classified: Secondary | ICD-10-CM

## 2014-05-30 DIAGNOSIS — C782 Secondary malignant neoplasm of pleura: Secondary | ICD-10-CM

## 2014-05-30 DIAGNOSIS — R7989 Other specified abnormal findings of blood chemistry: Secondary | ICD-10-CM

## 2014-05-30 DIAGNOSIS — C773 Secondary and unspecified malignant neoplasm of axilla and upper limb lymph nodes: Secondary | ICD-10-CM

## 2014-05-30 DIAGNOSIS — C50919 Malignant neoplasm of unspecified site of unspecified female breast: Secondary | ICD-10-CM

## 2014-05-30 DIAGNOSIS — C50412 Malignant neoplasm of upper-outer quadrant of left female breast: Secondary | ICD-10-CM

## 2014-05-30 DIAGNOSIS — R0789 Other chest pain: Secondary | ICD-10-CM

## 2014-05-30 DIAGNOSIS — C799 Secondary malignant neoplasm of unspecified site: Secondary | ICD-10-CM

## 2014-05-30 DIAGNOSIS — J910 Malignant pleural effusion: Secondary | ICD-10-CM

## 2014-05-30 DIAGNOSIS — I890 Lymphedema, not elsewhere classified: Secondary | ICD-10-CM | POA: Diagnosis present

## 2014-05-30 DIAGNOSIS — R945 Abnormal results of liver function studies: Secondary | ICD-10-CM

## 2014-05-30 LAB — COMPREHENSIVE METABOLIC PANEL (CC13)
ALT: 84 U/L — ABNORMAL HIGH (ref 0–55)
ANION GAP: 12 meq/L — AB (ref 3–11)
AST: 39 U/L — ABNORMAL HIGH (ref 5–34)
Albumin: 3.8 g/dL (ref 3.5–5.0)
Alkaline Phosphatase: 81 U/L (ref 40–150)
BUN: 12.6 mg/dL (ref 7.0–26.0)
CHLORIDE: 102 meq/L (ref 98–109)
CO2: 27 mEq/L (ref 22–29)
CREATININE: 0.9 mg/dL (ref 0.6–1.1)
Calcium: 10 mg/dL (ref 8.4–10.4)
EGFR: 72 mL/min/{1.73_m2} — AB (ref >90)
Glucose: 87 mg/dl (ref 70–140)
Potassium: 3.8 mEq/L (ref 3.5–5.1)
SODIUM: 141 meq/L (ref 136–145)
TOTAL PROTEIN: 7 g/dL (ref 6.4–8.3)
Total Bilirubin: 0.36 mg/dL (ref 0.20–1.20)

## 2014-05-30 LAB — CBC WITH DIFFERENTIAL/PLATELET
BASO%: 0.9 % (ref 0.0–2.0)
BASOS ABS: 0.1 10*3/uL (ref 0.0–0.1)
EOS%: 1.9 % (ref 0.0–7.0)
Eosinophils Absolute: 0.1 10*3/uL (ref 0.0–0.5)
HCT: 39.5 % (ref 34.8–46.6)
HGB: 13.1 g/dL (ref 11.6–15.9)
LYMPH%: 20.2 % (ref 14.0–49.7)
MCH: 31.1 pg (ref 25.1–34.0)
MCHC: 33.1 g/dL (ref 31.5–36.0)
MCV: 93.9 fL (ref 79.5–101.0)
MONO#: 0.5 10*3/uL (ref 0.1–0.9)
MONO%: 7.6 % (ref 0.0–14.0)
NEUT#: 4.8 10*3/uL (ref 1.5–6.5)
NEUT%: 69.4 % (ref 38.4–76.8)
Platelets: 380 10*3/uL (ref 145–400)
RBC: 4.21 10*6/uL (ref 3.70–5.45)
RDW: 13.6 % (ref 11.2–14.5)
WBC: 7 10*3/uL (ref 3.9–10.3)
lymph#: 1.4 10*3/uL (ref 0.9–3.3)

## 2014-05-30 NOTE — Therapy (Signed)
[place], [place], [address] Phone: [phone]   Fax:  [phone]  Physical Therapy Treatment  Patient Details  Name: [name] [name] MRN: [id number] Date of Birth: [birth year] Referring Provider:  Deatra Robinson, MD  Encounter Date: 05/30/2014      PT End of Session - 05/30/14 1749    Visit Number 18   Number of Visits 25   Date for PT Re-Evaluation 06/16/14   PT Start Time 1306   PT Stop Time 1350   PT Time Calculation (min) 44 min   Activity Tolerance Patient tolerated treatment well   Behavior During Therapy [facility] for tasks assessed/performed      Past Medical History  Diagnosis Date  . Seizures 2010    Isolated incident.  . Breast cancer dx'd 2005/2011  . PONV (postoperative nausea and vomiting)   . Peripheral vascular disease 02/2010    blood clot related to porta cath    Past Surgical History  Procedure Laterality Date  . Breast lumpectomy  2005  . Axillary lymph node dissection  Dec. 2011  . Portacath placement  12/11  . Removal portacath    . Mediastinotomy chamberlain mcneil Left 06/02/2013    Procedure: MEDIASTINOTOMY CHAMBERLAIN MCNEIL;  Surgeon: [name], MD;  Location: [place];  Service: Thoracic;  Laterality: Left;  LEFT ANTERIOR MEDIASTINOTOMY     There were no vitals taken for this visit.  Visit Diagnosis:  Lymphedema  Other chest pain      Subjective Assessment - 05/30/14 1307    Symptoms Liver values on blood test are down quite a bit toward normal.   Currently in Pain? Yes   Pain Score 2    Pain Location Flank   Pain Orientation Left   Pain Relieving Factors ibuprofen                    [facility] Adult PT Treatment/Exercise - 05/30/14 0001    Manual Therapy   Manual Lymphatic Drainage (MLD) In left sidelying, posterior interaxillary anastomosis and right axillo-inguinal anastomosis; in supine, short neck, left axilla and anterior  interaxillary anastomosis, right groin and axillo-inguinal anastomosis, area between right breast scars, directing toward pathways, and right upper arm.  In right sidelying, left periscapular area toward left groin.   Manual Therapy   Massage in right sidelying, to left upper back and neck.   Myofascial Release At right axillary scar with soft tissue mobilization and myofascial release.                        [facility] Goals - 05/22/14 1248    CC Long Term Goal  #1   Status On-going   CC Long Term Goal  #2   Status On-going            Plan - 05/30/14 1749    Clinical Impression Statement Less induration felt at area between scars at right lateral breast today.   Pt will benefit from skilled therapeutic intervention in order to improve on the following deficits Increased edema;Pain;Increased fascial restricitons   Rehab Potential Good   Clinical Impairments Affecting Rehab Potential active cancer   PT Frequency 2x / week   PT Duration 12 weeks   PT Treatment/Interventions Manual lymph drainage;Manual techniques;Scar mobilization   PT Next Visit Plan Manual lymph drainage, myofascial release, soft tissue work.   Consulted and Agree with Plan of Care Patient  Problem List Patient Active Problem List   Diagnosis Date Noted  . Chest wall pain 03/21/2014  . Abnormal LFTs (liver function tests) 09/12/2013  . Pleural effusion 08/18/2013  . 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 Breast cancer metastasized to multiple sites 08/18/2013    [facility] 05/30/2014, 5:51 PM  [place], [place], [address] Phone: [phone]   Fax:  [phone]  [name], [place]

## 2014-05-30 NOTE — Progress Notes (Signed)
  HPI:  [name] [name] returns today for follow-up regarding her pleural catheter.  She is a 54 year old woman with metastatic breast cancer who had a malignant left pleural effusion. She's had a Pleurx catheter in place for about 9 months now. She was limiting her drainage due to discomfort. I recommended that she drain that completely at least twice a week.  Since her last visit she's been draining twice weekly. She says that she drains completely the majority of the time although there been 2 occasions where she had to stop due to pain. She's been draining between 110 and 350 mL at a session. Her total drainage for the week has been in the 500 mL range although slightly less than that of his past week.  She did have a CT in the interim since her last visit which showed stabilization of her metastatic disease.  Past Medical History  Diagnosis Date  . Seizures 2010    Isolated incident.  . Breast cancer dx'd 2005/2011  . PONV (postoperative nausea and vomiting)   . Peripheral vascular disease 02/2010    blood clot related to porta cath  '    Current Outpatient Prescriptions  Medication Sig Dispense Refill  . ALPRAZolam (XANAX) 0.5 MG tablet Take 1 tablet (0.5 mg total) by mouth 2 (two) times daily as needed for anxiety. 30 tablet 0  . b complex vitamins capsule Take 1 capsule by mouth daily.    . Calcium-Magnesium-Vitamin D (CITRACAL CALCIUM+D PO) Take by mouth. 400mg  vitamin C, 500mg  vitamin D3 2 PO DAILY    . exemestane (AROMASIN) 25 MG tablet Take 25 mg by mouth daily after breakfast.   4  . folic acid (FOLVITE) 1 MG tablet Take 1 mg by mouth daily.    Marland Kitchen ibuprofen (ADVIL,MOTRIN) 200 MG tablet Take 200 mg by mouth every 4 (four) hours as needed.    . Melatonin 1 MG TABS Take 3 mg by mouth at bedtime as needed (sleep).     . [DISCONTINUED] metoCLOPramide (REGLAN) 10 MG tablet Take 1 tablet (10 mg total) by mouth 4 (four) times daily -  before meals and at bedtime. 60 tablet 3    No current facility-administered medications for this visit.    Physical Exam Ht 5\' 3"  (1.6 m)  Wt 140 lb (63.504 kg)  BMI 24.63 kg/m68 54 year old woman in no acute distress Lungs with slightly diminished breath sounds at left base otherwise clear Pleural catheter dressed  Diagnostic Tests: Chest x-ray shows a small residual left pleural effusion  Impression: 54 year old woman with stage IV breast cancer with malignant left pleural effusion. She has had a Pleurx catheter in place for about 9 months now. She is been draining twice weekly, and trying to drain the effusion completely. She still draining around 250 mL at a time on average.  Once again discussed her options. Those options include continued drainage on twice weekly basis, talc pleurodesis via the pleural catheter, and VATS for talc poudrage. She is aware of the advantages and disadvantages of each of those approaches. I tried to encourage her to consider talc Pleurodesis via the pleural catheter. She is reluctant to do that at this time.  Plan: Continue at least twice weekly catheter drainage.  Return in 4 weeks with PA and lateral chest x-ray.

## 2014-05-31 ENCOUNTER — Ambulatory Visit (HOSPITAL_BASED_OUTPATIENT_CLINIC_OR_DEPARTMENT_OTHER): Payer: 59 | Admitting: Oncology

## 2014-05-31 DIAGNOSIS — R7989 Other specified abnormal findings of blood chemistry: Secondary | ICD-10-CM

## 2014-05-31 DIAGNOSIS — E8989 Other postprocedural endocrine and metabolic complications and disorders: Secondary | ICD-10-CM | POA: Insufficient documentation

## 2014-05-31 DIAGNOSIS — C50211 Malignant neoplasm of upper-inner quadrant of right female breast: Secondary | ICD-10-CM

## 2014-05-31 DIAGNOSIS — K7460 Unspecified cirrhosis of liver: Secondary | ICD-10-CM

## 2014-05-31 DIAGNOSIS — C773 Secondary and unspecified malignant neoplasm of axilla and upper limb lymph nodes: Secondary | ICD-10-CM

## 2014-05-31 DIAGNOSIS — C50412 Malignant neoplasm of upper-outer quadrant of left female breast: Secondary | ICD-10-CM

## 2014-05-31 DIAGNOSIS — Z86718 Personal history of other venous thrombosis and embolism: Secondary | ICD-10-CM

## 2014-05-31 DIAGNOSIS — I890 Lymphedema, not elsewhere classified: Secondary | ICD-10-CM

## 2014-05-31 DIAGNOSIS — C50919 Malignant neoplasm of unspecified site of unspecified female breast: Secondary | ICD-10-CM

## 2014-05-31 DIAGNOSIS — C782 Secondary malignant neoplasm of pleura: Secondary | ICD-10-CM

## 2014-05-31 DIAGNOSIS — R945 Abnormal results of liver function studies: Secondary | ICD-10-CM

## 2014-05-31 DIAGNOSIS — R0789 Other chest pain: Secondary | ICD-10-CM

## 2014-05-31 DIAGNOSIS — M8580 Other specified disorders of bone density and structure, unspecified site: Secondary | ICD-10-CM

## 2014-05-31 DIAGNOSIS — J90 Pleural effusion, not elsewhere classified: Secondary | ICD-10-CM

## 2014-05-31 NOTE — Progress Notes (Signed)
[facility]  [phone] Fax:([phone]) [phone]     ID: [name] [name] OB: 04-14-1960  MR#: [id number]  GBT#:[id number]  PCP: Pcp Not In System GYN:  [name] [name] SU:  OTHER MD: [name] [name], Goff, [name] [name], [name] [name], [name] [name]  CHIEF COMPLAINT: Stage IV breast cancer  CURRENT TREATMENT: exemestane  BREAST CANCER HISTORY: From doctor [name] [name]'s intake note 03/20/2004:  "The patient is a very pleasant 54 year old female, without significant past medical history.  Her family history is significant for a sister who at age 45 was diagnosed with invasive ductal carcinoma.  She is a breast cancer survivor at age 18 now.  The patient states that she has never really had a screening mammogram until October 2005, when she felt that it was time for her to start having mammograms done on a yearly basis.  Therefore, on 01/26/04, she underwent a screening mammogram and an abnormality was detected in the upper outer right breast.  She, therefore, underwent spot compression views of both the right and the left breast.  The left breast revealed a well-defined mass in the upper outer left quadrant, present at the 2 o'clock position, measuring 1.8 cm, 6 cm from the nipple.  This, by ultrasound, was felt to be a simple cyst measuring 1.8 cm.  On the right breast, a spiculated mass was noted in the upper outer right quadrant.  The ultrasound revealed a shadowing irregular solid mass at the 10:30 position, 9 cm from the nipple, measuring 1.2 cm in greatest dimension, correlating with the spiculated mass seen on the mammogram.  The right axilla was negative ultrasonically.  Because of this, the patient underwent a needle biopsy of the right breast and the biopsy was positive invasive mammary carcinoma that showed features consistent with a high-grade invasive ductal carcinoma associated with desmoplastic stroma.  No in situ  component was seen and no definite lymphovascular invasion was identified.  On the core biopsy, the tumor measured about 0.8 cm.  Because of this, she was seen by Dr. [name] and the patient was taken to the [facility] on March 15, 2004.  She underwent a right breast lumpectomy with sentinel node biopsy.  The final pathology revealed an invasive ductal carcinoma, measuring 1.7 cm, grade 2 of 3.  Margins were free of tumor.  Atypical lobular hyperplasia was noted.  One sentinel node was removed which was negative for metastatic disease.  The tumor was staged at T1c, N0 MX.  It was estrogen receptor positive, progesterone receptor positive.  HER-2/neu was 2+.  FISH was negative.  All margins were free of tumor.  She is now seen in Medical Oncology for further evaluation and management of this newly diagnosed T1c, node negative, stage I, invasive ductal carcinoma of the right breast."  Her subsequent history is as detailed below  INTERVAL HISTORY: [name] [name] returns today for followup of her stage IV breast cancer accompanied by her husband [name] [name]. She has been off everolimus now since 04/17/2014. She is feeling much better. Her liver function tests have normalized (for her: She still has steatosis of course). She is functioning at as normal a level as she is capable. She walked 3 miles this past weekend when the weather was better. She was able to go uphill, although a little bit more slowly. She does not have a cough or pleurisy except when the fluid is drained "to the max". She is tolerating the exemestane with some concerns regarding  loose bowel movements, nausea, in of course hot flashes and vaginal dryness. She feels she can continue on it indefinitely however, "so long as it works".  REVIEW OF SYSTEMS: [name] [name] herself a "negative ion bracelet" and started sleeping better after she started wearing it. She continues to have her fluid drained twice a week and to do physical therapy twice a week. She has had  rare headaches, no visual changes, no vomiting, does continue to have altered taste, no problems with bladder control. She denies rash or bleeding problems and has not had a fever. There is a little scab that forms at the exit site of the Pleurx. We discussed her trying a little bit of bacitracin when they finish draining, before the replace the bandage, to see if they can make that area little bit softer for her. Otherwise a detailed review of systems today was stable  PAST MEDICAL HISTORY: Past Medical History  Diagnosis Date  . Seizures 2010    Isolated incident.  . Breast cancer dx'd 2005/2011  . PONV (postoperative nausea and vomiting)   . Peripheral vascular disease 02/2010    blood clot related to porta cath    PAST SURGICAL HISTORY: Past Surgical History  Procedure Laterality Date  . Breast lumpectomy  2005  . Axillary lymph node dissection  Dec. 2011  . Portacath placement  12/11  . Removal portacath    . Mediastinotomy chamberlain mcneil Left 06/02/2013    Procedure: MEDIASTINOTOMY CHAMBERLAIN MCNEIL;  Surgeon: [name] Nakayama, MD;  Location: [facility];  Service: Thoracic;  Laterality: Left;  LEFT ANTERIOR MEDIASTINOTOMY     FAMILY HISTORY No family history on file. The patient's father is living, 73 years old as of may 2015. He lives in [place]. The patient's mother died from complications of COPD at the age of 32. These has 2 brothers, one sister. Her sister developed breast cancer at the age of 29. She is doing well. The patient herself underwent genetic testing at [facility] in 2011 and was found to be BRCA negative  GYNECOLOGIC HISTORY:  Menarche age 34, she is GX P0. She stopped having periods with her initial chemotherapy in 2006.  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 Her husband [name] [name] is a Medical illustrator in [place]. He has a child from a prior marriage. At home they have 2 rescue dogs,  [name] [name] and [name] [name]. The patient is religious [name] not a church attender    ADVANCED DIRECTIVES: In place   HEALTH MAINTENANCE: History  Substance Use Topics  . Smoking status: Never Smoker   . Smokeless tobacco: Never Used  . Alcohol Use: No     Colonoscopy:  PAP:  Bone density: March 2015; mild osteopenia  Lipid panel:  Allergies  Allergen Reactions  . Decadron [Dexamethasone] Other (See Comments)    Patient does not tolerate steroids.   . Enoxaparin Other (See Comments)    unknown  . Fluconazole Swelling  . Hydromorphone Hcl Nausea And Vomiting  . Morphine And Related   . Tegaderm Ag Mesh [Silver]     Current Outpatient Prescriptions  Medication Sig Dispense Refill  . ALPRAZolam (XANAX) 0.5 MG tablet Take 1 tablet (0.5 mg total) by mouth 2 (two) times daily as needed for anxiety. 30 tablet 0  . b complex vitamins capsule Take 1 capsule by mouth daily.    . Calcium-Magnesium-Vitamin D (CITRACAL CALCIUM+D PO) Take by mouth. 45m  vitamin C, 558m vitamin D3 2 PO DAILY    . exemestane (AROMASIN) 25 MG tablet Take 25 mg by mouth daily after breakfast.   4  . folic acid (FOLVITE) 1 MG tablet Take 1 mg by mouth daily.    .Marland Kitchenibuprofen (ADVIL,MOTRIN) 200 MG tablet Take 200 mg by mouth every 4 (four) hours as needed.    . Melatonin 1 MG TABS Take 3 mg by mouth at bedtime as needed (sleep).     . [DISCONTINUED] metoCLOPramide (REGLAN) 10 MG tablet Take 1 tablet (10 mg total) by mouth 4 (four) times daily -  before meals and at bedtime. 60 tablet 3   No current facility-administered medications for this visit.    OBJECTIVE: Middle-aged white woman who appears stated age There were no vitals filed for this visit.   There is no weight on file to calculate BMI.   There were no vitals filed for this visit. Patient refuses weight and vitals    ECOG FS:1 - Symptomatic [name] completely ambulatory  Sclerae unicteric, pupils equal and reactive, EOMs intact Oropharynx clear, good  dentition No cervical or supraclavicular adenopathy Lungs dullness to percussion about one third of the way up the left base, no wheezes  or crackles Heart regular rate and rhythm Abd soft, Obese,nontender, positive bowel sounds MSK no focal spinal tenderness Neuro: nonfocal, well oriented, appropriate affect Breasts: Deferred     LAB RESULTS: Results for WALEJAH, ARISTIZABAL(MRN [id number] as of 05/31/2014 06:17  Ref. Range 04/12/2014 10:26 04/17/2014 11:45 04/24/2014 11:54 05/01/2014 11:32 05/30/2014 11:43  AST Latest Range: 0-37 U/L 52 (H) 81 (H) 95 (H) 51 (H) 39 (H)  ALT Latest Range: 0-35 U/L 87 (H) 131 (H) 169 (H) 107 (H) 84 (H)    CMP     Component Value Date/Time   NA 141 05/30/2014 1143   NA 134* 09/03/2013 2145   K 3.8 05/30/2014 1143   K 3.7 09/03/2013 2145   CL 97 09/03/2013 2145   CL 105 05/06/2012 1333   CO2 27 05/30/2014 1143   CO2 23 09/03/2013 2145   GLUCOSE 87 05/30/2014 1143   GLUCOSE 121* 09/03/2013 2145   GLUCOSE 124* 05/06/2012 1333   BUN 12.6 05/30/2014 1143   BUN 8 09/03/2013 2145   CREATININE 0.9 05/30/2014 1143   CREATININE 0.64 09/03/2013 2145   CALCIUM 10.0 05/30/2014 1143   CALCIUM 9.0 09/03/2013 2145   PROT 7.0 05/30/2014 1143   PROT 6.8 09/03/2013 2145   ALBUMIN 3.8 05/30/2014 1143   ALBUMIN 3.3* 09/03/2013 2145   AST 39* 05/30/2014 1143   AST 55* 09/03/2013 2145   ALT 84* 05/30/2014 1143   ALT 85* 09/03/2013 2145   ALKPHOS 81 05/30/2014 1143   ALKPHOS 114 09/03/2013 2145   BILITOT 0.36 05/30/2014 1143   BILITOT 0.3 09/03/2013 2145   GFRNONAA >90 09/03/2013 2145   GFRAA >90 09/03/2013 2145    No results found for: SPEP  Lab Results  Component Value Date   WBC 7.0 05/30/2014   NEUTROABS 4.8 05/30/2014   HGB 13.1 05/30/2014   HCT 39.5 05/30/2014   MCV 93.9 05/30/2014   PLT 380 05/30/2014      Chemistry      Component Value Date/Time   NA 141 05/30/2014 1143   NA 134* 09/03/2013 2145   K 3.8 05/30/2014 1143   K 3.7  09/03/2013 2145   CL 97 09/03/2013 2145   CL 105 05/06/2012 1333   CO2 27 05/30/2014 1143  CO2 23 09/03/2013 2145   BUN 12.6 05/30/2014 1143   BUN 8 09/03/2013 2145   CREATININE 0.9 05/30/2014 1143   CREATININE 0.64 09/03/2013 2145      Component Value Date/Time   CALCIUM 10.0 05/30/2014 1143   CALCIUM 9.0 09/03/2013 2145   ALKPHOS 81 05/30/2014 1143   ALKPHOS 114 09/03/2013 2145   AST 39* 05/30/2014 1143   AST 55* 09/03/2013 2145   ALT 84* 05/30/2014 1143   ALT 85* 09/03/2013 2145   BILITOT 0.36 05/30/2014 1143   BILITOT 0.3 09/03/2013 2145       Lab Results  Component Value Date   LABCA2 25 11/02/2007    No components found for: [id number]  No results for input(s): INR in the last 168 hours.  Urinalysis    Component Value Date/Time   COLORURINE YELLOW 09/03/2013 2109   APPEARANCEUR CLEAR 09/03/2013 2109   LABSPEC 1.005 09/12/2013 1542   LABSPEC 1.016 09/03/2013 2109   PHURINE 6.0 09/03/2013 2109   GLUCOSEU Negative 09/12/2013 [place] 09/03/2013 2109   HGBUR NEGATIVE 09/03/2013 2109   BILIRUBINUR NEGATIVE 09/03/2013 2109   KETONESUR NEGATIVE 09/03/2013 2109   PROTEINUR NEGATIVE 09/03/2013 2109   UROBILINOGEN 0.2 09/12/2013 1542   UROBILINOGEN 0.2 09/03/2013 2109   NITRITE Negative 09/12/2013 1542   NITRITE NEGATIVE 09/03/2013 2109   LEUKOCYTESUR NEGATIVE 09/03/2013 2109    STUDIES: Dg Chest 2 View  05/30/2014   CLINICAL DATA:  Left pleural effusion. Pleural drain and history of breast cancer.  EXAM: CHEST  2 VIEW  COMPARISON:  CT 05/15/2014 and plain film of 05/10/2014  FINDINGS: Right axillary node dissection. Midline trachea. Normal heart size. Soft tissue fullness about the left side of the mediastinum likely represents pleural metastasis, when compared to recent CT.  Left-sided pleural drainage catheter remains in place. No pneumothorax. Small left pleural effusion is slightly increased since 05/10/2014. Patchy left base atelectasis is  similar.  IMPRESSION: Slight increase in small left pleural effusion since 05/10/2014.  PleurX catheter remaining in place, without pneumothorax.  Bulky left-sided pleural metastasis, as before.   Electronically Signed   By: Abigail Miyamoto M.D.   On: 05/30/2014 10:31   Dg Chest 2 View  05/10/2014   CLINICAL DATA:  Metastatic breast cancer, PleurX catheter in place, 350 cc pleural fluid drained today. Left-sided chest pain.  EXAM: CHEST  2 VIEW  COMPARISON:  02/27/2014.  FINDINGS: Trachea is midline. Heart size normal. A small bore chest tube terminates in the upper left hemi thorax. Tiny residual left pleural effusion or pleural thickening. Minimal left basilar atelectasis with improved aeration at the left lung base. Right lung is clear.  Postoperative asymmetry of the right breast with surgical clips in the right axilla.  IMPRESSION: 1. Tiny residual left pleural effusion or pleural thickening with PleurX catheter in place. 2. Minimal residual subsegmental atelectasis at the base of the left hemi thorax.   Electronically Signed   By: Lorin Picket M.D.   On: 05/10/2014 12:44   Ct Chest W Contrast  05/15/2014   CLINICAL DATA:  Metastatic breast cancer.  EXAM: CT CHEST WITH CONTRAST  TECHNIQUE: Multidetector CT imaging of the chest was performed during intravenous contrast administration.  CONTRAST:  54m OMNIPAQUE IOHEXOL 300 MG/ML  SOLN  COMPARISON:  PET-CT scan 03/29/2014, 01/25/2014  FINDINGS: Mediastinum/Nodes: Lymphadenectomy clips in the right axilla. No supraclavicular adenopathy.  There is invasion of the left mediastinum by extensive pleural thickening. This invasive carcinoma extends into the  left apical mediastinum along the course of the drainage catheter. The pleural thickening extend into the AP with and along the left aspect of the main pulmonary artery. No significant change prior.  17 mm subcarinal lymph node not changed from prior.  Lungs/Pleura: There is circumferential rind of enhancing  oral tissue in the left hemi thorax. The most masslike portion is in the left upper lobe adjacent to the left upper mediastinum at the level of the main pulmonary artery. This thickening measure up to 32 mm (image 24, series 2) compared to 33 mm on comparison PET-CT scan. Nodular thickening is also noted in the left lower lobe posteriorly to similar degree as prior.  The nodular pleura extension extends along the left oblique fissure (image 39, series 5). There is interval decrease in left pleural effusion seen on prior. Residual small effusion does remain.  Review of the right lung parenchyma demonstrates no suspicious nodularity.  Musculoskeletal: No aggressive osseous lesion.  IMPRESSION: 1. 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 2. Bulky pleural disease in the left upper lobe along the pleural surface and extends into mediastinum adjacent to the main pulmonary artery. Findings are not significantly changed compared to prior. 3. Interval decrease volume left pleural effusion. Drainage catheter in place.   Electronically Signed   By: Suzy Bouchard M.D.   On: 05/15/2014 17:00    ASSESSMENT: 54 y.o. [place] woman with stage IV breast cancer, history as follows  (1)  S/p Right lumpectomy and sentinel lymph node sampling 03/15/2004 for a pT1c pN0. Stage IA invasive ductal carcinoma, grade 2, estrogen receptor 95% positive, progesterone receptor 65% positive, HER-2 not amplified; additional surgery 04/25/2004 for seroma or clearance showed no residual tumor  (2) adjuvant chemotherapy with cyclophosphamide and doxorubicin every 21 days x4 completed 07/19/2004  (3) adjuvant radiation given under Dr. Donella Stade in [place] completed July 2006  (4) the patient opted against adjuvant antiestrogen therapy  (5) genetics testing showed no BRCA mutations  (6) biopsy of a palpable right axillary mass 10/24/2009 showed invasive ductal carcinoma, grade 3, estrogen receptor  100% positive, progesterone receptor 2% positive (alert score 5) HER-2 negative; no evidence of systemic disease on PET scanning  (7) completed 3 of 4 planned cycles of docetaxel and cyclophosphamide September 2011, fourth cycle omitted because of marked elevations in liver function tests  (8) an right axillary lymph node dissection 03/06/2010 showed 3/8 lymph nodes removed to be involved by tumor, with extracapsular extension.  (9) 45 Gy radiation to the right axillary and right supraclavicular nodal areas, with capecitabine sensitization, completed March 2012   (10) intolerant of letrozole and exemestane; on tamoxifen with interruptions September 2012 to March 2013, [name] then continuing on tamoxifen more continuously through March of 2015  (11) biopsy of mediastinal adenopathy 06/02/2013 shows invasive ductal carcinoma (gross cystic disease fluid protein positive, TTS-1 negative), estrogen receptor 80% positive, progesterone receptor 2% positive, HER-2 not amplified  (12) letrozole started March 2015-- tolerated with significant side effects, discontinued at the end of May 2015  (13) PET scan 08/16/2013 shows extensive left pleural metastatic disease and a large left pleural effusion that shifts cardiac and mediastinal structures to the right; adenopathy (celiac trunk, periadrenal, periaortic); and a left medial clavicular lesion; Status post left thoracentesis 08/16/2013 positive for adenocarcinoma, estrogen receptor positive, progesterone receptor negative.  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, mutations in [facility] C1638G, a complex rearrangement in PIK3R2, and amplification ofPIK3C2B]],  amplification of MCL1  and MDM4, anda MAP2K4 R287H mutation; everolimus was suggested as an available targeted agent  (18) exemestane started 03/31/2014  (a) everolimus added 04/03/2014 [name] not tolerated (cytopenias, elevated LFTs) even at minimal doses; stopped 04/17/2014    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 treated with Lovenox for 5-6 months  (c) right upper extremity lymphedema  (d) hepatic steatosis with chronically elevated LFTs as well as unusual hepatic sensitivity to chemotherapy  (e) osteopenia with the lowest T score -1.6 on bone density scan 06/20/2013  (f) radiation oncology (Dr Valere Dross) has reviewed prior radiation records in case there is further mediastinal involvement with dysphagia etc in which case palliative XRT could be considered   PLAN: I met with [name] and [name] today for approximately one hour and 10 minutes, reviewing her complex situation.Lainy is looking and doing better than the last time I saw her. She really did not tolerate the everolimus well, and being off it now for some weeks she has a much better functional status. She is doing fine on the exemestane. The question is how to go forward.  We are going to repeat a CT scan of the chest made April. I am hoping for stable disease [name] would be delighted to see anything better than that. This means she is not going to be able to enter the study until May at least (if the CT scan results are unfavorable and she goes off the exemestane they would still be a period of "washout" for a few weeks before she could enroll in a protocol).  Of course whether protocol will be available is very questionable. She is not interested in "double therapy" such as for example an antiestrogen and IPI 3K inhibitor. She would want the PICC K inhibitor alone. I cannot obtain that for her at present since it is not commercially available and  I am not aware of any trials doing that at this present.  We had previously discussed the possibility of her hiring a research nurse to help her in this. I encouraged her to go ahead and do that at this point so she can be "ready to go" if the CT scan in April is unfavorable. Of course we will continue to keep Dr. [name] at [facility] appraised of what is going on in case there are new opportunities there.  While all this is going on she will continue to drain her left pleural effusion at twice a week. If she drains it more aggressively, she would have some chance of obtaining a pleurodesis-like effect, [name] she has significant pain when she drains it "to the Limited" and "that I would just worsen my quality of life". We discussed her going into the hospital to get the pleurodesis accomplished once and for all, which just does involve more pain [name] for shorter. Of time. At this point she is not willing to do that. She tells me Dr. [name] is interested in infusing some inflammatory agent through the Pleurx catheter under chronic conditions and I certainly have no problems with that approach. He has much more experience in that regard the [facility].  At this point, then, the plan is to continue exemestane restage mid April, and  consider a protocol versus their low-dose weekly carboplatin if we don't get good news from the CT scan.  These has a good understanding of this plan. She agrees with it. She will call with any problems that may develop for her next visit here   Chauncey Cruel, MD   05/31/2014 5:48 PM

## 2014-06-02 ENCOUNTER — Ambulatory Visit: Payer: 59 | Admitting: Physical Therapy

## 2014-06-02 DIAGNOSIS — R0789 Other chest pain: Secondary | ICD-10-CM

## 2014-06-02 DIAGNOSIS — I890 Lymphedema, not elsewhere classified: Secondary | ICD-10-CM

## 2014-06-02 NOTE — Therapy (Signed)
[place], [place], [address] Phone: [phone]   Fax:  [phone]  Physical Therapy Treatment  Patient Details  Name: [name] [name] MRN: [id number] Date of Birth: [birth year] Referring Provider:  Deatra Robinson, MD  Encounter Date: 06/02/2014      PT End of Session - 06/02/14 1222    Visit Number 19   Number of Visits 25   Date for PT Re-Evaluation 06/16/14   PT Start Time 4917   PT Stop Time 1103   PT Time Calculation (min) 40 min      Past Medical History  Diagnosis Date  . Seizures 2010    Isolated incident.  . Breast cancer dx'd 2005/2011  . PONV (postoperative nausea and vomiting)   . Peripheral vascular disease 02/2010    blood clot related to porta cath    Past Surgical History  Procedure Laterality Date  . Breast lumpectomy  2005  . Axillary lymph node dissection  Dec. 2011  . Portacath placement  12/11  . Removal portacath    . Mediastinotomy chamberlain mcneil Left 06/02/2013    Procedure: MEDIASTINOTOMY CHAMBERLAIN MCNEIL;  Surgeon: [name], MD;  Location: [facility];  Service: Thoracic;  Laterality: Left;  LEFT ANTERIOR MEDIASTINOTOMY     There were no vitals taken for this visit.  Visit Diagnosis:  Lymphedema  Other chest pain      Subjective Assessment - 06/02/14 1023    Currently in Pain? Yes   Pain Score 3    Pain Location Flank   Pain Orientation Left;Right   Pain Relieving Factors therapy                    [facility] Adult PT Treatment/Exercise - 06/02/14 0001    Manual Therapy   Manual Lymphatic Drainage (MLD) In left sidelying, posterior interaxillary anastomosis and right axillo-inguinal anastomosis; in supine, short neck, left axilla and anterior interaxillary anastomosis, right groin and axillo-inguinal anastomosis, area between right breast scars, directing toward pathways, and right upper arm.  In right sidelying, left  periscapular area toward left groin.   Manual Therapy   Massage in right sidelying, to left upper back and neck.   Myofascial Release At right axillary scar with soft tissue mobilization and myofascial release.                        [facility] Goals - 05/22/14 1248    CC Long Term Goal  #1   Status On-going   CC Long Term Goal  #2   Status On-going            Plan - 06/02/14 1222    Clinical Impression Statement Patient continues to note benefit of therapy in decreasing discomfort and swelling.   Pt will benefit from skilled therapeutic intervention in order to improve on the following deficits Increased edema;Pain;Increased fascial restricitons   PT Next Visit Plan Manual lymph drainage, myofascial release, soft tissue work.        Problem List Patient Active Problem List   Diagnosis Date Noted  . 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 Chest wall pain 03/21/2014  . Abnormal LFTs (liver function tests) 09/12/2013  . Pleural effusion 08/18/2013  . 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 Breast cancer metastasized to multiple sites 08/18/2013    [place] 06/02/2014, 12:24 PM  [place]  [place], [place], [address] Phone: [phone]   Fax:  [place], PT 06/02/2014 12:24 PM

## 2014-06-05 ENCOUNTER — Ambulatory Visit: Payer: 59 | Admitting: Physical Therapy

## 2014-06-05 ENCOUNTER — Other Ambulatory Visit: Payer: Self-pay | Admitting: *Deleted

## 2014-06-05 DIAGNOSIS — C50211 Malignant neoplasm of upper-inner quadrant of right female breast: Secondary | ICD-10-CM

## 2014-06-05 DIAGNOSIS — C50919 Malignant neoplasm of unspecified site of unspecified female breast: Secondary | ICD-10-CM

## 2014-06-05 DIAGNOSIS — R0789 Other chest pain: Secondary | ICD-10-CM

## 2014-06-05 DIAGNOSIS — I890 Lymphedema, not elsewhere classified: Secondary | ICD-10-CM

## 2014-06-05 DIAGNOSIS — J90 Pleural effusion, not elsewhere classified: Secondary | ICD-10-CM

## 2014-06-05 NOTE — Therapy (Signed)
[place], [place], [address] Phone: [phone]   Fax:  [phone]  Physical Therapy Treatment  Patient Details  Name: [name] [name] MRN: [id number] Date of Birth: 1961-01-20 Referring Provider:  Consuela Mimes, MD  Encounter Date: 06/05/2014      PT End of Session - 06/05/14 1248    Visit Number 20   Number of Visits 25   Date for PT Re-Evaluation 06/16/14   PT Start Time 1018   PT Stop Time 1059   PT Time Calculation (min) 41 min   Activity Tolerance Patient tolerated treatment well   Behavior During Therapy [facility] for tasks assessed/performed      Past Medical History  Diagnosis Date  . Seizures 2010    Isolated incident.  . Breast cancer dx'd 2005/2011  . PONV (postoperative nausea and vomiting)   . Peripheral vascular disease 02/2010    blood clot related to porta cath    Past Surgical History  Procedure Laterality Date  . Breast lumpectomy  2005  . Axillary lymph node dissection  Dec. 2011  . Portacath placement  12/11  . Removal portacath    . Mediastinotomy chamberlain mcneil Left 06/02/2013    Procedure: MEDIASTINOTOMY CHAMBERLAIN MCNEIL;  Surgeon: [name], MD;  Location: [facility];  Service: Thoracic;  Laterality: Left;  LEFT ANTERIOR MEDIASTINOTOMY     There were no vitals taken for this visit.  Visit Diagnosis:  Lymphedema  Other chest pain      Subjective Assessment - 06/05/14 1018    Symptoms Kind of draggy today.   Currently in Pain? Yes   Pain Score 4    Pain Location Flank   Pain Orientation Right;Left   Aggravating Factors  swelling   Pain Relieving Factors therapy                    [facility] Adult PT Treatment/Exercise - 06/05/14 0001    Manual Therapy   Manual Lymphatic Drainage (MLD) In left sidelying, posterior interaxillary anastomosis and right axillo-inguinal anastomosis; in supine, short neck, left axilla and anterior  interaxillary anastomosis, right groin and axillo-inguinal anastomosis, area between right breast scars, directing toward pathways, and right upper arm.  In right sidelying, left periscapular area toward left groin.   Manual Therapy   Massage in right sidelying, to left upper back and neck.   Myofascial Release At right axillary scar with soft tissue mobilization and myofascial release.                        [facility] Goals - 06/05/14 1249    CC Long Term Goal  #1   Status On-going   CC Long Term Goal  #2   Status On-going            Plan - 06/05/14 1248    Clinical Impression Statement Feels better after session.   Pt will benefit from skilled therapeutic intervention in order to improve on the following deficits Increased edema;Pain;Increased fascial restricitons   Rehab Potential Good   Clinical Impairments Affecting Rehab Potential active cancer   PT Frequency 2x / week   PT Duration 12 weeks   PT Treatment/Interventions Manual lymph drainage;Manual techniques;Scar mobilization   PT Next Visit Plan Manual lymph drainage, myofascial release, soft tissue work.   Consulted and Agree with Plan of Care Patient        Problem List Patient Active Problem List  Diagnosis Date Noted  . 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 Chest wall pain 03/21/2014  . Abnormal LFTs (liver function tests) 09/12/2013  . Pleural effusion 08/18/2013  . 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 Breast cancer metastasized to multiple sites 08/18/2013    [facility] 06/05/2014, 12:50 PM  [place], [place], [address] Phone: [phone]   Fax:  [place], PT 06/05/2014 12:50 PM

## 2014-06-06 ENCOUNTER — Telehealth: Payer: Self-pay | Admitting: Oncology

## 2014-06-06 NOTE — Telephone Encounter (Signed)
per GM to sch pt appt-cld & adv pt of time & date of appt-pt understood

## 2014-06-08 ENCOUNTER — Telehealth: Payer: Self-pay | Admitting: *Deleted

## 2014-06-08 ENCOUNTER — Encounter: Payer: Self-pay | Admitting: Radiation Oncology

## 2014-06-08 NOTE — Telephone Encounter (Signed)
Patient called and wants Marlon Pel RN to call her back re: study trials, how to co-ordinate with Dr. Jana Hakim and how to move forward.  Will route to [employer] RN

## 2014-06-09 ENCOUNTER — Ambulatory Visit: Payer: 59 | Admitting: Physical Therapy

## 2014-06-09 DIAGNOSIS — I890 Lymphedema, not elsewhere classified: Secondary | ICD-10-CM

## 2014-06-09 DIAGNOSIS — R0789 Other chest pain: Secondary | ICD-10-CM

## 2014-06-09 NOTE — Therapy (Signed)
[place], [place], [address] Phone: [phone]   Fax:  [phone]  Physical Therapy Treatment  Patient Details  Name: [name] [name] MRN: [id number] Date of Birth: April 30, 1960 Referring Provider:  Consuela Mimes, MD  Encounter Date: 06/09/2014      PT End of Session - 06/09/14 1107    Visit Number 21   Number of Visits 25   Date for PT Re-Evaluation 06/16/14   PT Start Time 1022   PT Stop Time 1102   PT Time Calculation (min) 40 min   Activity Tolerance Patient tolerated treatment well;Patient limited by pain;Other (comment)  pain made transitional movements slow and uncomfortable      Past Medical History  Diagnosis Date  . Seizures 2010    Isolated incident.  . Breast cancer dx'd 2005/2011  . PONV (postoperative nausea and vomiting)   . Peripheral vascular disease 02/2010    blood clot related to porta cath    Past Surgical History  Procedure Laterality Date  . Breast lumpectomy  2005  . Axillary lymph node dissection  Dec. 2011  . Portacath placement  12/11  . Removal portacath    . Mediastinotomy chamberlain mcneil Left 06/02/2013    Procedure: MEDIASTINOTOMY CHAMBERLAIN MCNEIL;  Surgeon: [name], MD;  Location: [facility];  Service: Thoracic;  Laterality: Left;  LEFT ANTERIOR MEDIASTINOTOMY     There were no vitals filed for this visit.  Visit Diagnosis:  Lymphedema  Other chest pain      Subjective Assessment - 06/09/14 1022    Currently in Pain? Yes   Pain Score 9    Pain Location Flank   Pain Orientation Left   Pain Onset Yesterday   Aggravating Factors  draining PleurX tube   Pain Relieving Factors therapy                       [facility] Adult PT Treatment/Exercise - 06/09/14 0001    Manual Therapy   Manual Lymphatic Drainage (MLD) In left sidelying, posterior interaxillary anastomosis and right axillo-inguinal anastomosis; in supine,  short neck, left axilla and anterior interaxillary anastomosis, right groin and axillo-inguinal anastomosis, area between right breast scars, directing toward pathways.  In right sidelying, left periscapular area toward left groin.   Manual Therapy   Massage in right sidelying, to left upper back and neck.  More time spent on massage today due to increased pain.   Myofascial Release At right axillary scar with soft tissue mobilization and myofascial release.                        [facility] Goals - 06/09/14 1110    CC Long Term Goal  #1   Status On-going   CC Long Term Goal  #2   Status On-going            Plan - 06/09/14 1108    Clinical Impression Statement Patient with high pain level today that resulted from PleurX tube draining yesterday (drained more than usual).  Reported improvement after session, and was able to raise her left arm higher than she could before treatment.   Pt will benefit from skilled therapeutic intervention in order to improve on the following deficits Increased edema;Pain;Increased fascial restricitons   Rehab Potential Good   Clinical Impairments Affecting Rehab Potential active cancer   PT Frequency 2x / week   PT Duration 12 weeks  PT Treatment/Interventions Manual lymph drainage;Manual techniques;Scar mobilization   PT Next Visit Plan Manual lymph drainage, myofascial release, soft tissue work.   Consulted and Agree with Plan of Care Patient   PT Plan Continue manual lymph drainage, soft tissue work, and myofascial release for pain and edema control.        Problem List Patient Active Problem List   Diagnosis Date Noted  . 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 Chest wall pain 03/21/2014  . Abnormal LFTs (liver function tests) 09/12/2013  . Pleural effusion 08/18/2013  . 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 Breast cancer metastasized to multiple sites 08/18/2013     [facility] 06/09/2014, 11:10 AM  [place], [place], [address] Phone: [phone]   Fax:  [place], PT 06/09/2014 11:11 AM

## 2014-06-12 ENCOUNTER — Ambulatory Visit: Payer: 59 | Admitting: Physical Therapy

## 2014-06-12 DIAGNOSIS — R0789 Other chest pain: Secondary | ICD-10-CM

## 2014-06-12 DIAGNOSIS — I890 Lymphedema, not elsewhere classified: Secondary | ICD-10-CM

## 2014-06-12 DIAGNOSIS — M6289 Other specified disorders of muscle: Secondary | ICD-10-CM

## 2014-06-12 NOTE — Therapy (Signed)
[place], [place], [address] Phone: [phone]   Fax:  [phone]  Physical Therapy Treatment  Patient Details  Name: [name] [name] MRN: [id number] Date of Birth: 1961-01-21 Referring Provider:  Consuela Mimes, MD  Encounter Date: 06/12/2014      PT End of Session - 06/12/14 2211    Visit Number 22   Number of Visits 25   Date for PT Re-Evaluation 06/16/14   PT Start Time 1025   PT Stop Time 1106   PT Time Calculation (min) 41 min   Activity Tolerance Patient tolerated treatment well   Behavior During Therapy [facility] for tasks assessed/performed      Past Medical History  Diagnosis Date  . Seizures 2010    Isolated incident.  . Breast cancer dx'd 2005/2011  . PONV (postoperative nausea and vomiting)   . Peripheral vascular disease 02/2010    blood clot related to porta cath    Past Surgical History  Procedure Laterality Date  . Breast lumpectomy  2005  . Axillary lymph node dissection  Dec. 2011  . Portacath placement  12/11  . Removal portacath    . Mediastinotomy chamberlain mcneil Left 06/02/2013    Procedure: MEDIASTINOTOMY CHAMBERLAIN MCNEIL;  Surgeon: [name], MD;  Location: [facility];  Service: Thoracic;  Laterality: Left;  LEFT ANTERIOR MEDIASTINOTOMY     There were no vitals filed for this visit.  Visit Diagnosis:  Lymphedema  Other chest pain  Muscle stiffness      Subjective Assessment - 06/12/14 1027    Symptoms Pain continued Friday and Saturday; got better later on Saturday.  Feels really tired since then.   Currently in Pain? Yes   Pain Score 4    Pain Location Flank   Pain Orientation Left                       [facility] Adult PT Treatment/Exercise - 06/12/14 0001    Manual Therapy   Manual Lymphatic Drainage (MLD) In left sidelying, posterior interaxillary anastomosis and right axillo-inguinal anastomosis; in supine, short neck,  left axilla and anterior interaxillary anastomosis, right groin and axillo-inguinal anastomosis, area between right breast scars, directing toward pathways.  In right sidelying, left periscapular area toward left groin.   Manual Therapy   Massage in right sidelying, to left upper back and neck.   Myofascial Release At right axillary scar with soft tissue mobilization and myofascial release.                        [facility] Goals - 06/09/14 1110    CC Long Term Goal  #1   Status On-going   CC Long Term Goal  #2   Status On-going            Plan - 06/12/14 2212    Clinical Impression Statement Palpable fullness/induration at left upper back soft tissue today; also at area between right lateral breast scars, especially medial aspect.  Pain down from late last week.   Pt will benefit from skilled therapeutic intervention in order to improve on the following deficits Increased edema;Pain;Increased fascial restricitons   Rehab Potential Good   Clinical Impairments Affecting Rehab Potential active cancer   PT Frequency 2x / week   PT Treatment/Interventions Manual lymph drainage;Manual techniques;Scar mobilization   PT Next Visit Plan Renew.  Manual lymph drainage, myofascial release, soft tissue work.  Problem List Patient Active Problem List   Diagnosis Date Noted  . 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 Chest wall pain 03/21/2014  . Abnormal LFTs (liver function tests) 09/12/2013  . Pleural effusion 08/18/2013  . 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 Breast cancer metastasized to multiple sites 08/18/2013    [facility] 06/12/2014, 10:15 PM  [place], [place], [address] Phone: [phone]   Fax:  [place], PT 06/12/2014 10:15 PM

## 2014-06-16 ENCOUNTER — Other Ambulatory Visit: Payer: Self-pay | Admitting: *Deleted

## 2014-06-16 ENCOUNTER — Telehealth: Payer: Self-pay | Admitting: *Deleted

## 2014-06-16 ENCOUNTER — Ambulatory Visit: Payer: 59 | Admitting: Physical Therapy

## 2014-06-16 DIAGNOSIS — C50211 Malignant neoplasm of upper-inner quadrant of right female breast: Secondary | ICD-10-CM

## 2014-06-16 DIAGNOSIS — C50919 Malignant neoplasm of unspecified site of unspecified female breast: Secondary | ICD-10-CM

## 2014-06-16 DIAGNOSIS — I890 Lymphedema, not elsewhere classified: Secondary | ICD-10-CM

## 2014-06-16 DIAGNOSIS — R0789 Other chest pain: Secondary | ICD-10-CM

## 2014-06-16 DIAGNOSIS — M6289 Other specified disorders of muscle: Secondary | ICD-10-CM

## 2014-06-16 NOTE — Therapy (Signed)
[place], [place], [address] Phone: [phone]   Fax:  [phone]  Physical Therapy Treatment  Patient Details  Name: [name] [name] MRN: [id number] Date of Birth: 1960/11/08 Referring Provider:  Consuela Mimes, MD  Encounter Date: 06/16/2014      PT End of Session - 06/16/14 1235    Visit Number 23   Number of Visits 47   Date for PT Re-Evaluation 09/08/14   PT Start Time 1020   PT Stop Time 1102   PT Time Calculation (min) 42 min      Past Medical History  Diagnosis Date  . Seizures 2010    Isolated incident.  . Breast cancer dx'd 2005/2011  . PONV (postoperative nausea and vomiting)   . Peripheral vascular disease 02/2010    blood clot related to porta cath    Past Surgical History  Procedure Laterality Date  . Breast lumpectomy  2005  . Axillary lymph node dissection  Dec. 2011  . Portacath placement  12/11  . Removal portacath    . Mediastinotomy chamberlain mcneil Left 06/02/2013    Procedure: MEDIASTINOTOMY CHAMBERLAIN MCNEIL;  Surgeon: [name], MD;  Location: [facility];  Service: Thoracic;  Laterality: Left;  LEFT ANTERIOR MEDIASTINOTOMY     There were no vitals filed for this visit.  Visit Diagnosis:  Lymphedema - Plan: PT plan of care cert/re-cert  Other chest pain - Plan: PT plan of care cert/re-cert  Muscle stiffness - Plan: PT plan of care cert/re-cert      Subjective Assessment - 06/16/14 1231    Symptoms Got drained yesterday without having more pain.   Currently in Pain? Yes   Pain Score --  not rated today                       [facility] Adult PT Treatment/Exercise - 06/16/14 0001    Manual Therapy   Manual Lymphatic Drainage (MLD) In left sidelying, posterior interaxillary anastomosis and right axillo-inguinal anastomosis; in supine, short neck, left axilla and anterior interaxillary anastomosis, right groin and axillo-inguinal  anastomosis, area between right breast scars, directing toward pathways.  In right sidelying, left periscapular area toward left groin.   Manual Therapy   Massage in right sidelying, to left upper back and neck.   Myofascial Release At right axillary scar with soft tissue mobilization and myofascial release.                        [facility] Goals - 06/16/14 1239    CC Long Term Goal  #1   Baseline Generally meeting this standard for pain, but has had pain flareups from draining PleurX tube; goal is ongoing   Status On-going   CC Long Term Goal  #2   Baseline Swelling has been controlled adequately with 2x/week therapy; goal is ongoing.   Status On-going            Plan - 06/16/14 1237    Clinical Impression Statement Still with firmness/fullness at both right breast scar areas and left upper back; patient continues to note benefit and increased comfort at end of sessions.   Pt will benefit from skilled therapeutic intervention in order to improve on the following deficits Increased edema;Pain;Increased fascial restricitons   Rehab Potential Good   Clinical Impairments Affecting Rehab Potential active cancer   PT Frequency 2x / week   PT Duration 12 weeks  PT Treatment/Interventions Manual lymph drainage;Manual techniques;Scar mobilization   PT Next Visit Plan Manual lymph dainage, myofascial release, soft tissue work   Consulted and Agree with Plan of Care Patient        Problem List Patient Active Problem List   Diagnosis Date Noted  . 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 Chest wall pain 03/21/2014  . Abnormal LFTs (liver function tests) 09/12/2013  . Pleural effusion 08/18/2013  . 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 Breast cancer metastasized to multiple sites 08/18/2013    [facility] 06/16/2014, 12:42 PM  [place], [place], [address] Phone: [phone]   Fax:  [place], PT 06/16/2014 12:42 PM

## 2014-06-16 NOTE — Telephone Encounter (Signed)
Call received asking to speak with collaborative nurse and asked if provider is in today.  "I left a message and need to speak with them."  A message has been sent by Marcie Bal RN to collaborative.  Kasia says she will wait for return call.  I need to iron this out.  Have concerns about chest."

## 2014-06-16 NOTE — Telephone Encounter (Signed)
Per MD review recommended obtaining CXR for comparison for increased growth due to no prior US at this facility.  Above discussed with pt who will come in Monday PM and then come over and obtain report from RN.

## 2014-06-16 NOTE — Telephone Encounter (Signed)
This RN returned call to pt.  Per phone discussion Tais states over the past week she has " felt the tumor in my left chest more"  Neha states she notices tumor more at night when she is reclining- " it feels more heavy and almost like a choking feeling up near my neck ".  She feels she has lost some weight that may be causing her to be able to palpate the nodule more easily " even the PT states she can feel it more then before "  Noya is inquiring if above area could be examined " maybe with an [place] " for evaluation.  " I am only taking the exemestane and is that enough ?"  Plan per call is to give above information to MD for review and recommendation- and call returned to pt.

## 2014-06-19 ENCOUNTER — Telehealth: Payer: Self-pay | Admitting: *Deleted

## 2014-06-19 ENCOUNTER — Ambulatory Visit (HOSPITAL_COMMUNITY)
Admission: RE | Admit: 2014-06-19 | Discharge: 2014-06-19 | Disposition: A | Discharge status: Home / Self Care | Payer: 59 | Source: Ambulatory Visit | Attending: Oncology | Admitting: Oncology

## 2014-06-19 ENCOUNTER — Other Ambulatory Visit: Payer: Self-pay | Admitting: Oncology

## 2014-06-19 ENCOUNTER — Ambulatory Visit: Payer: 59 | Admitting: Physical Therapy

## 2014-06-19 DIAGNOSIS — R0789 Other chest pain: Secondary | ICD-10-CM

## 2014-06-19 DIAGNOSIS — M6289 Other specified disorders of muscle: Secondary | ICD-10-CM

## 2014-06-19 DIAGNOSIS — I890 Lymphedema, not elsewhere classified: Secondary | ICD-10-CM

## 2014-06-19 DIAGNOSIS — C50211 Malignant neoplasm of upper-inner quadrant of right female breast: Secondary | ICD-10-CM | POA: Insufficient documentation

## 2014-06-19 DIAGNOSIS — C799 Secondary malignant neoplasm of unspecified site: Secondary | ICD-10-CM | POA: Diagnosis not present

## 2014-06-19 DIAGNOSIS — C50919 Malignant neoplasm of unspecified site of unspecified female breast: Secondary | ICD-10-CM

## 2014-06-19 NOTE — Telephone Encounter (Signed)
Called patient as instructed by Dr. Jana Hakim to notify her he will contact Radiology department.  Reports she was about to call me as she has noted in the October PET mention or the pectoralis muscle.  Would like to talk to Dr. Jana Hakim.  Concerned about effects of exemestane.

## 2014-06-19 NOTE — Progress Notes (Unsigned)
[name] [name] dropped in today to receive a copy of the chest x-ray just done this morning. It essentially shows no change in the lungs. This is favorable, and might book. She has been feeling something in her anterior chest, so Dr. 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  I discussed this with [name]. My suggestion is that we do keep the next CT scan as scheduled, in mid April, to give the exemestane a little time to work. If she is not comfortable with this we can certainly repeat a CT scan sooner, but I'm not sure that I would stop BX of mastoid at this point even if it showed a little bit of progression as compared to the prior.  She is comfortable with the decision of waiting.

## 2014-06-19 NOTE — Telephone Encounter (Signed)
Call received from patient.  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  "I want a detailed report of everything in my body with measurements.  Has it grown?  I need comparisons of every test.  This pain has worsened for four months and was surprised by today's xray results."  Instructed to print PET to view measurements not there and isn't mentioned in any of my tests because they didn't look for what was not asked of them to read.  I want to know about everything in my body by the end of this week."  Will notify providers of this request for addendum to reports.  "If anything has been visible with previous radiology exams, she wants to know measurements and expects written reports with all tests.  I need to know and ask the right questions in the future.  The medicine is not working."  Return number [phone].

## 2014-06-20 NOTE — Therapy (Signed)
[place], [place], [address] Phone: [phone]   Fax:  [phone]  Physical Therapy Treatment  Patient Details  Name: [name] [name] MRN: [id number] Date of Birth: 1960/12/16 Referring Provider:  Consuela Mimes, MD  Encounter Date: 06/19/2014      PT End of Session - 06/20/14 0956    Visit Number 24   Number of Visits 47   Date for PT Re-Evaluation 09/08/14   PT Start Time 1024   PT Stop Time 1102   PT Time Calculation (min) 38 min      Past Medical History  Diagnosis Date  . Seizures 2010    Isolated incident.  . Breast cancer dx'd 2005/2011  . PONV (postoperative nausea and vomiting)   . Peripheral vascular disease 02/2010    blood clot related to porta cath    Past Surgical History  Procedure Laterality Date  . Breast lumpectomy  2005  . Axillary lymph node dissection  Dec. 2011  . Portacath placement  12/11  . Removal portacath    . Mediastinotomy chamberlain mcneil Left 06/02/2013    Procedure: MEDIASTINOTOMY CHAMBERLAIN MCNEIL;  Surgeon: [name], MD;  Location: [place] [facility];  Service: Thoracic;  Laterality: Left;  LEFT ANTERIOR MEDIASTINOTOMY     There were no vitals filed for this visit.  Visit Diagnosis:  Lymphedema  Other chest pain  Muscle stiffness      Subjective Assessment - 06/19/14 1027    Symptoms Getting an x-ray today to see if they can tell if the cancer on the left chest has grown, rather than waiting another month for the CT.   Currently in Pain? Yes   Pain Score 4    Pain Location Flank  also left chest is tender, like something stuck in throat; right side (axilla) is poofy   Pain Orientation Left                       [facility] Adult PT Treatment/Exercise - 06/20/14 0001    Manual Therapy   Manual Lymphatic Drainage (MLD) In left sidelying, posterior interaxillary anastomosis and right axillo-inguinal anastomosis; in supine,  short neck, left axilla and anterior interaxillary anastomosis, right groin and axillo-inguinal anastomosis, area between right breast scars, directing toward pathways.  In right sidelying, left periscapular area toward left groin.   Manual Therapy   Massage in right sidelying, to left upper back and neck.   Myofascial Release At right axillary scar with soft tissue mobilization and myofascial release.                        [facility] Goals - 06/16/14 1239    CC Long Term Goal  #1   Baseline Generally meeting this standard for pain, but has had pain flareups from draining PleurX tube; goal is ongoing   Status On-going   CC Long Term Goal  #2   Baseline Swelling has been controlled adequately with 2x/week therapy; goal is ongoing.   Status On-going            Plan - 06/20/14 1740    Clinical Impression Statement A little less fullness/firmness today at left upper back.   Pt will benefit from skilled therapeutic intervention in order to improve on the following deficits Increased edema;Pain;Increased fascial restricitons   Rehab Potential Good   PT Treatment/Interventions Manual lymph drainage;Manual techniques;Scar mobilization        Problem  List Patient Active Problem List   Diagnosis Date Noted  . 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 Chest wall pain 03/21/2014  . Abnormal LFTs (liver function tests) 09/12/2013  . Pleural effusion 08/18/2013  . 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 Breast cancer metastasized to multiple sites 08/18/2013    [facility] 06/20/2014, 9:59 AM  [place], [place], [address] Phone: [phone]   Fax:  [place], PT 06/20/2014 10:00 AM

## 2014-06-21 ENCOUNTER — Telehealth: Payer: Self-pay

## 2014-06-21 ENCOUNTER — Telehealth: Payer: Self-pay | Admitting: *Deleted

## 2014-06-21 ENCOUNTER — Encounter: Payer: Self-pay | Admitting: *Deleted

## 2014-06-21 NOTE — Telephone Encounter (Signed)
Pt called requesting CT scan be moved up to see MD after scan. Also she is wondering if a referral to Dr Valere Dross would be in order to see if he can do any treatment to her pectoral mass. She has a lot of discomfort to the area. This is routed to Dr Jana Hakim and RN per pt care coordination note.

## 2014-06-21 NOTE — Telephone Encounter (Signed)
Return call asking status of Ct scan appointment being moved up sooner and having RT for left pectoralis muscle involvement.  "I'm left handed and every move I make, I feel discomfort."  Called collaborative nurse informing of patient call. Also mentioned "looking into a clinical trial at [facility].  Per protocol you must wait four weeks before any RT.

## 2014-06-21 NOTE — Progress Notes (Signed)
Received voice message from [place] at [facility] that patient requesting to see if she qualifies for any clinical trials. Stephanie's phone number is [phone] and fax # is [phone]. This RN faxed Dr. Virgie Dad last office note, PET scan, and surgical pathology.

## 2014-06-22 ENCOUNTER — Ambulatory Visit: Payer: 59 | Admitting: Physical Therapy

## 2014-06-22 ENCOUNTER — Telehealth: Payer: Self-pay | Admitting: *Deleted

## 2014-06-22 DIAGNOSIS — I890 Lymphedema, not elsewhere classified: Secondary | ICD-10-CM | POA: Diagnosis not present

## 2014-06-22 DIAGNOSIS — R0789 Other chest pain: Secondary | ICD-10-CM

## 2014-06-22 DIAGNOSIS — M6289 Other specified disorders of muscle: Secondary | ICD-10-CM

## 2014-06-22 NOTE — Therapy (Signed)
[place], [place], [address] Phone: [phone]   Fax:  [phone]  Physical Therapy Treatment  Patient Details  Name: [name] [name] MRN: [id number] Date of Birth: 1960/10/19 Referring Provider:  Consuela Mimes, MD  Encounter Date: 06/22/2014      PT End of Session - 06/22/14 0910    Visit Number 25   Number of Visits 47   Date for PT Re-Evaluation 09/08/14   PT Start Time 0817   PT Stop Time 0902   PT Time Calculation (min) 45 min   Activity Tolerance Patient tolerated treatment well   Behavior During Therapy [facility] for tasks assessed/performed      Past Medical History  Diagnosis Date  . Seizures 2010    Isolated incident.  . Breast cancer dx'd 2005/2011  . PONV (postoperative nausea and vomiting)   . Peripheral vascular disease 02/2010    blood clot related to porta cath    Past Surgical History  Procedure Laterality Date  . Breast lumpectomy  2005  . Axillary lymph node dissection  Dec. 2011  . Portacath placement  12/11  . Removal portacath    . Mediastinotomy chamberlain mcneil Left 06/02/2013    Procedure: MEDIASTINOTOMY CHAMBERLAIN MCNEIL;  Surgeon: [name], MD;  Location: [facility];  Service: Thoracic;  Laterality: Left;  LEFT ANTERIOR MEDIASTINOTOMY     There were no vitals filed for this visit.  Visit Diagnosis:  Lymphedema  Other chest pain  Muscle stiffness      Subjective Assessment - 06/22/14 0907    Symptoms "Not doing great."  Had more discomfort after being drained again on Monday.   Pertinent History X-ray seemed to show increase in pectoral mass; spoke to clinical trial coordinator at length and got hopeful about that, but then was eliminated due to one treatment of Erubilin.  Hoping to move up the CT scan to sooner than three weeks from now.                             Currently in Pain? Yes   Pain Score --  not rated today                        [facility] Adult PT Treatment/Exercise - 06/22/14 0001    Manual Therapy   Manual Lymphatic Drainage (MLD) In left sidelying, posterior interaxillary anastomosis and right axillo-inguinal anastomosis; in supine, short neck, left axilla and anterior interaxillary anastomosis, right groin and axillo-inguinal anastomosis, area between right breast scars, directing toward pathways.  In right sidelying, left periscapular area toward left groin.   Manual Therapy   Massage in right sidelying, to left upper back and neck.   Myofascial Release At right axillary scar with soft tissue mobilization and myofascial release.                        [facility] Goals - 06/22/14 0911    CC Long Term Goal  #1   Status On-going   CC Long Term Goal  #2   Status On-going            Plan - 06/22/14 0910    Clinical Impression Statement Pt. with  moderate fullness/firmness today both at area between two right lateral breast incisions and at left upper back.   Pt will benefit from skilled therapeutic intervention in order to  improve on the following deficits Increased edema;Pain;Increased fascial restricitons   Rehab Potential Good   Clinical Impairments Affecting Rehab Potential active cancer   PT Frequency 2x / week   PT Duration 2 weeks   PT Treatment/Interventions Manual lymph drainage;Manual techniques;Scar mobilization   PT Next Visit Plan Manual lymph dainage, myofascial release, soft tissue work   Consulted and Agree with Plan of Care Patient        Problem List Patient Active Problem List   Diagnosis Date Noted  . 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 Chest wall pain 03/21/2014  . Abnormal LFTs (liver function tests) 09/12/2013  . Pleural effusion 08/18/2013  . 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 Breast cancer metastasized to multiple sites 08/18/2013     [facility] 06/22/2014, 9:12 AM  [place], [place], [address] Phone: [phone]   Fax:  [place], PT 06/22/2014 9:12 AM

## 2014-06-22 NOTE — Telephone Encounter (Signed)
PT. WANTS HER 07/17/14 CT SCAN AND THE 07/18/14 APPOINTMENT WITH DR.MAGRINAT MOVED TO EARLIER DATES.

## 2014-06-25 ENCOUNTER — Other Ambulatory Visit: Payer: Self-pay | Admitting: Oncology

## 2014-06-25 DIAGNOSIS — C50919 Malignant neoplasm of unspecified site of unspecified female breast: Secondary | ICD-10-CM

## 2014-06-26 ENCOUNTER — Other Ambulatory Visit: Payer: Self-pay | Admitting: *Deleted

## 2014-06-26 ENCOUNTER — Telehealth: Payer: Self-pay | Admitting: *Deleted

## 2014-06-26 ENCOUNTER — Telehealth: Payer: Self-pay | Admitting: Oncology

## 2014-06-26 ENCOUNTER — Other Ambulatory Visit: Payer: Self-pay | Admitting: Oncology

## 2014-06-26 ENCOUNTER — Ambulatory Visit: Payer: 59 | Admitting: Physical Therapy

## 2014-06-26 DIAGNOSIS — M6289 Other specified disorders of muscle: Secondary | ICD-10-CM

## 2014-06-26 DIAGNOSIS — R0789 Other chest pain: Secondary | ICD-10-CM

## 2014-06-26 DIAGNOSIS — I890 Lymphedema, not elsewhere classified: Secondary | ICD-10-CM

## 2014-06-26 DIAGNOSIS — G893 Neoplasm related pain (acute) (chronic): Secondary | ICD-10-CM

## 2014-06-26 NOTE — Telephone Encounter (Signed)
Referral noted for dr Valere Dross and rad onc will call thwe patient

## 2014-06-26 NOTE — Telephone Encounter (Signed)
CT scheduled for 3/31 at 330- pt to be at [facility] at 315 and NPO 4 hours before.  Request placed for lab and IV start at this office prior to CT.  Pt aware of plan and is awaiting appointment times.

## 2014-06-26 NOTE — Telephone Encounter (Signed)
Patient requesting a call back from [facility] or [facility] regarding pectoral pain and CT scans.  Questions whether a decision has been made to move up CTscan, as well as pulling past CT scans for new measurements.  Needs to know if she will need radiation.

## 2014-06-26 NOTE — Telephone Encounter (Signed)
Val, I requested a CT this week and will put in a request to see Dr Valere Dross after that-- let her know  Thanks!

## 2014-06-26 NOTE — Therapy (Signed)
[place], [place], [address] Phone: [phone]   Fax:  [phone]  Physical Therapy Treatment  Patient Details  Name: [name] [name] MRN: [id number] Date of Birth: [birth year] Referring Provider:  Consuela Mimes, MD  Encounter Date: 06/26/2014      PT End of Session - 06/26/14 1203    Visit Number 26   Number of Visits 47   Date for PT Re-Evaluation 09/08/14   PT Start Time 0109   PT Stop Time 1108   PT Time Calculation (min) 53 min   Activity Tolerance Patient tolerated treatment well   Behavior During Therapy [facility] for tasks assessed/performed      Past Medical History  Diagnosis Date  . Seizures 2010    Isolated incident.  . Breast cancer dx'd 2005/2011  . PONV (postoperative nausea and vomiting)   . Peripheral vascular disease 02/2010    blood clot related to porta cath    Past Surgical History  Procedure Laterality Date  . Breast lumpectomy  2005  . Axillary lymph node dissection  Dec. 2011  . Portacath placement  12/11  . Removal portacath    . Mediastinotomy chamberlain mcneil Left 06/02/2013    Procedure: MEDIASTINOTOMY CHAMBERLAIN MCNEIL;  Surgeon: [name], MD;  Location: [place];  Service: Thoracic;  Laterality: Left;  LEFT ANTERIOR MEDIASTINOTOMY     There were no vitals filed for this visit.  Visit Diagnosis:  Lymphedema  Other chest pain  Muscle stiffness      Subjective Assessment - 06/26/14 1017    Symptoms It hurts more every day--the one in the pectoral area.  I haven't heard back about moving the CT up or looking at it again.   Currently in Pain? Yes   Pain Score 5   3-4 on the right and left sides   Pain Location Chest   Pain Orientation Left   Aggravating Factors  metastases at left pectoral area   Pain Relieving Factors manual lymph drainage                       [facility] Adult PT Treatment/Exercise - 06/26/14 0001    Manual Therapy   Manual Lymphatic Drainage (MLD) In left sidelying, posterior interaxillary anastomosis and right axillo-inguinal anastomosis; in supine, short neck, left axilla and anterior interaxillary anastomosis, right groin and axillo-inguinal anastomosis, area between right breast scars, directing toward pathways.  In right sidelying, left periscapular area toward left groin.   Manual Therapy   Massage in right sidelying, to left upper back and neck.   Myofascial Release At right axillary scar with soft tissue mobilization and myofascial release.                        [facility] Goals - 06/22/14 0911    CC Long Term Goal  #1   Status On-going   CC Long Term Goal  #2   Status On-going            Plan - 06/26/14 1204    Clinical Impression Statement Patient having more pain at left pectoral area; continues to feel manual lymph drainage gives symptom relief.   PT Treatment/Interventions Manual lymph drainage;Manual techniques;Scar mobilization   PT Next Visit Plan Manual lymph dainage, myofascial release, soft tissue work        Problem List Patient Active Problem List   Diagnosis Date Noted  . Post-lymphadenectomy lymphedema of  arm 05/31/2014  . Chest wall pain 03/21/2014  . Abnormal LFTs (liver function tests) 09/12/2013  . Pleural effusion 08/18/2013  . 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 Breast cancer metastasized to multiple sites 08/18/2013    [facility] 06/26/2014, 12:06 PM  [place], [place], [address] Phone: [phone]   Fax:  [place], PT 06/26/2014 12:07 PM

## 2014-06-26 NOTE — Telephone Encounter (Signed)
No note

## 2014-06-26 NOTE — Telephone Encounter (Signed)
Per voice mail from desk RN I have moved her iv start and lab appts to 3/31

## 2014-06-27 ENCOUNTER — Ambulatory Visit: Payer: Self-pay | Admitting: Thoracic Surgery (Cardiothoracic Vascular Surgery)

## 2014-06-28 ENCOUNTER — Telehealth: Payer: Self-pay | Admitting: *Deleted

## 2014-06-28 NOTE — Telephone Encounter (Signed)
[name] [name] called because she has not heard from [place] about moving her appointment with Dr. Jana Hakim to sooner than 07/18/14.  She is insistent that it needs to be immediately after CT scans and Dr. Valere Dross appointment.  Please let her know whether or not this is possible and/or has been done.

## 2014-06-29 ENCOUNTER — Other Ambulatory Visit: Payer: Self-pay | Admitting: *Deleted

## 2014-06-29 ENCOUNTER — Ambulatory Visit (HOSPITAL_COMMUNITY)
Admission: RE | Admit: 2014-06-29 | Discharge: 2014-06-29 | Disposition: A | Discharge status: Home / Self Care | Payer: 59 | Source: Ambulatory Visit | Attending: Oncology | Admitting: Oncology

## 2014-06-29 ENCOUNTER — Ambulatory Visit: Payer: 59

## 2014-06-29 ENCOUNTER — Other Ambulatory Visit (HOSPITAL_BASED_OUTPATIENT_CLINIC_OR_DEPARTMENT_OTHER): Payer: 59

## 2014-06-29 DIAGNOSIS — Z923 Personal history of irradiation: Secondary | ICD-10-CM | POA: Diagnosis not present

## 2014-06-29 DIAGNOSIS — C50211 Malignant neoplasm of upper-inner quadrant of right female breast: Secondary | ICD-10-CM | POA: Diagnosis not present

## 2014-06-29 DIAGNOSIS — C50919 Malignant neoplasm of unspecified site of unspecified female breast: Secondary | ICD-10-CM | POA: Diagnosis present

## 2014-06-29 DIAGNOSIS — Z9221 Personal history of antineoplastic chemotherapy: Secondary | ICD-10-CM | POA: Insufficient documentation

## 2014-06-29 DIAGNOSIS — I890 Lymphedema, not elsewhere classified: Secondary | ICD-10-CM | POA: Insufficient documentation

## 2014-06-29 DIAGNOSIS — J90 Pleural effusion, not elsewhere classified: Secondary | ICD-10-CM

## 2014-06-29 DIAGNOSIS — R7989 Other specified abnormal findings of blood chemistry: Secondary | ICD-10-CM

## 2014-06-29 DIAGNOSIS — R945 Abnormal results of liver function studies: Secondary | ICD-10-CM

## 2014-06-29 LAB — COMPREHENSIVE METABOLIC PANEL (CC13)
ALT: 58 U/L — ABNORMAL HIGH (ref 0–55)
ANION GAP: 14 meq/L — AB (ref 3–11)
AST: 32 U/L (ref 5–34)
Albumin: 4.1 g/dL (ref 3.5–5.0)
Alkaline Phosphatase: 74 U/L (ref 40–150)
BUN: 14.2 mg/dL (ref 7.0–26.0)
CHLORIDE: 105 meq/L (ref 98–109)
CO2: 25 meq/L (ref 22–29)
CREATININE: 0.8 mg/dL (ref 0.6–1.1)
Calcium: 10.5 mg/dL — ABNORMAL HIGH (ref 8.4–10.4)
EGFR: 80 mL/min/{1.73_m2} — ABNORMAL LOW (ref >90)
Glucose: 80 mg/dl (ref 70–140)
Potassium: 4.4 mEq/L (ref 3.5–5.1)
Sodium: 144 mEq/L (ref 136–145)
TOTAL PROTEIN: 7.5 g/dL (ref 6.4–8.3)
Total Bilirubin: 0.39 mg/dL (ref 0.20–1.20)

## 2014-06-29 LAB — CBC WITH DIFFERENTIAL/PLATELET
BASO%: 0.3 % (ref 0.0–2.0)
BASOS ABS: 0 10*3/uL (ref 0.0–0.1)
EOS ABS: 0.1 10*3/uL (ref 0.0–0.5)
EOS%: 1.8 % (ref 0.0–7.0)
HCT: 42.2 % (ref 34.8–46.6)
HEMOGLOBIN: 14.5 g/dL (ref 11.6–15.9)
LYMPH#: 1.6 10*3/uL (ref 0.9–3.3)
LYMPH%: 21.8 % (ref 14.0–49.7)
MCH: 32.5 pg (ref 25.1–34.0)
MCHC: 34.4 g/dL (ref 31.5–36.0)
MCV: 94.6 fL (ref 79.5–101.0)
MONO#: 0.5 10*3/uL (ref 0.1–0.9)
MONO%: 7.1 % (ref 0.0–14.0)
NEUT%: 69 % (ref 38.4–76.8)
NEUTROS ABS: 5.1 10*3/uL (ref 1.5–6.5)
Platelets: 298 10*3/uL (ref 145–400)
RBC: 4.46 10*6/uL (ref 3.70–5.45)
RDW: 13.5 % (ref 11.2–14.5)
WBC: 7.4 10*3/uL (ref 3.9–10.3)

## 2014-06-29 MED ORDER — IOHEXOL 300 MG/ML  SOLN
80.0000 mL | Freq: Once | INTRAMUSCULAR | Status: AC | PRN
  Administered 2014-06-29: 80 mL via INTRAVENOUS

## 2014-06-30 ENCOUNTER — Ambulatory Visit: Payer: 59 | Attending: Oncology | Admitting: Physical Therapy

## 2014-06-30 ENCOUNTER — Other Ambulatory Visit: Payer: Self-pay | Admitting: *Deleted

## 2014-06-30 ENCOUNTER — Encounter: Payer: Self-pay | Admitting: Radiation Oncology

## 2014-06-30 ENCOUNTER — Ambulatory Visit (HOSPITAL_BASED_OUTPATIENT_CLINIC_OR_DEPARTMENT_OTHER): Payer: 59 | Admitting: Oncology

## 2014-06-30 ENCOUNTER — Telehealth: Payer: Self-pay | Admitting: Oncology

## 2014-06-30 DIAGNOSIS — J90 Pleural effusion, not elsewhere classified: Secondary | ICD-10-CM

## 2014-06-30 DIAGNOSIS — C50412 Malignant neoplasm of upper-outer quadrant of left female breast: Secondary | ICD-10-CM | POA: Diagnosis not present

## 2014-06-30 DIAGNOSIS — R945 Abnormal results of liver function studies: Secondary | ICD-10-CM

## 2014-06-30 DIAGNOSIS — Z86718 Personal history of other venous thrombosis and embolism: Secondary | ICD-10-CM

## 2014-06-30 DIAGNOSIS — R7989 Other specified abnormal findings of blood chemistry: Secondary | ICD-10-CM

## 2014-06-30 DIAGNOSIS — I890 Lymphedema, not elsewhere classified: Secondary | ICD-10-CM | POA: Diagnosis not present

## 2014-06-30 DIAGNOSIS — M8580 Other specified disorders of bone density and structure, unspecified site: Secondary | ICD-10-CM

## 2014-06-30 DIAGNOSIS — E8989 Other postprocedural endocrine and metabolic complications and disorders: Secondary | ICD-10-CM

## 2014-06-30 DIAGNOSIS — C50919 Malignant neoplasm of unspecified site of unspecified female breast: Secondary | ICD-10-CM

## 2014-06-30 DIAGNOSIS — M6289 Other specified disorders of muscle: Secondary | ICD-10-CM

## 2014-06-30 DIAGNOSIS — R0789 Other chest pain: Secondary | ICD-10-CM

## 2014-06-30 DIAGNOSIS — C773 Secondary and unspecified malignant neoplasm of axilla and upper limb lymph nodes: Secondary | ICD-10-CM | POA: Diagnosis not present

## 2014-06-30 DIAGNOSIS — C50211 Malignant neoplasm of upper-inner quadrant of right female breast: Secondary | ICD-10-CM

## 2014-06-30 MED ORDER — PALBOCICLIB 75 MG PO CAPS: 75.0000 mg | ORAL_CAPSULE | Freq: Every day | ORAL | Status: DC

## 2014-06-30 MED ORDER — INV-PALBOCICLIB 75 MG CAPS #23 SWOG S1400 SUB-STUDY C: 75.0000 mg | ORAL_CAPSULE | ORAL | Status: DC

## 2014-06-30 NOTE — Telephone Encounter (Signed)
Gave patient avs report and appointments for April and May.  °

## 2014-06-30 NOTE — Therapy (Signed)
[place], [place], [address] Phone: [phone]   Fax:  [phone]  Physical Therapy Treatment  Patient Details  Name: [name] [name] MRN: [id number] Date of Birth: 03-Jul-1960 Referring Provider:  Consuela Mimes, MD  Encounter Date: 06/30/2014      PT End of Session - 06/30/14 1134    Visit Number 27   Number of Visits 47   Date for PT Re-Evaluation 09/08/14   PT Start Time 1020   PT Stop Time 1103   PT Time Calculation (min) 43 min   Activity Tolerance Patient tolerated treatment well   Behavior During Therapy [facility] for tasks assessed/performed      Past Medical History  Diagnosis Date  . Seizures 2010    Isolated incident.  . Breast cancer dx'd 2005/2011  . PONV (postoperative nausea and vomiting)   . Peripheral vascular disease 02/2010    blood clot related to porta cath    Past Surgical History  Procedure Laterality Date  . Breast lumpectomy  2005  . Axillary lymph node dissection  Dec. 2011  . Portacath placement  12/11  . Removal portacath    . Mediastinotomy chamberlain mcneil Left 06/02/2013    Procedure: MEDIASTINOTOMY CHAMBERLAIN MCNEIL;  Surgeon: [name], MD;  Location: [place];  Service: Thoracic;  Laterality: Left;  LEFT ANTERIOR MEDIASTINOTOMY     There were no vitals filed for this visit.  Visit Diagnosis:  Lymphedema  Other chest pain  Muscle stiffness      Subjective Assessment - 06/30/14 1131    Symptoms Had CT yesterday and got results with Dr. Jana Hakim this morning.  The cancer had grown a little.  Going to start Wade in April.   Currently in Pain? Yes   Pain Score 4    Pain Location Flank   Pain Orientation Left;Right   Pain Relieving Factors manual therapy (soft tissue work, manual lymph drainage)                       [facility] Adult PT Treatment/Exercise - 06/30/14 0001    Manual Therapy   Manual Lymphatic Drainage  (MLD) In left sidelying, posterior interaxillary anastomosis and right axillo-inguinal anastomosis; in supine, short neck, left axilla and anterior interaxillary anastomosis, right groin and axillo-inguinal anastomosis, area between right breast scars, directing toward pathways.  In right sidelying, left periscapular area toward left groin.   Manual Therapy   Massage in right sidelying, to left upper back and neck; also to left low back today due to pain from CT positioning yesterday   Myofascial Release At right axillary scar with soft tissue mobilization and myofascial release.                        [facility] Goals - 06/30/14 1137    CC Long Term Goal  #1   Status On-going   CC Long Term Goal  #2   Status On-going            Plan - 06/30/14 1136    Clinical Impression Statement Patient with more pain today from positioning for CT yesterday; continues to benefit from soft tissue work and manual lymph drainage for symptom relief.   Pt will benefit from skilled therapeutic intervention in order to improve on the following deficits Increased edema;Pain;Increased fascial restricitons   Rehab Potential Good   Clinical Impairments Affecting Rehab Potential active cancer   PT  Frequency 2x / week   PT Duration 6 weeks   PT Treatment/Interventions Manual lymph drainage;Manual techniques;Scar mobilization   PT Next Visit Plan Manual lymph dainage, myofascial release, soft tissue work   Consulted and Agree with Plan of Care Patient        Problem List Patient Active Problem List   Diagnosis Date Noted  . 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 Chest wall pain 03/21/2014  . Abnormal LFTs (liver function tests) 09/12/2013  . Pleural effusion 08/18/2013  . 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 Breast cancer metastasized to multiple sites 08/18/2013    [facility] 06/30/2014, 11:39 AM  [place], [place], [address] Phone: [phone]   Fax:  [place], PT 06/30/2014 11:39 AM

## 2014-06-30 NOTE — Progress Notes (Addendum)
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 Lurline Del - Palbociclib 75 mg starting every 3 day, with first dose on April, 18th.  Patient's main complaints related to symptomatic tumor(s) are: Pleursy. 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 Walker? Wheelchair?: Notes slower gait, but not unstable  SAFETY ISSUES:  Prior radiation? 2006- Right Breast, 2011Adjuvant Radiation Therapy to her Right [place] and Axillary regions by Dr. Sandi Mealy [facility]- completed march 2012  Pacemaker/ICD? NO  Possible current pregnancy? No  Is the patient on methotrexate? NO   Additional Complaints / other details:    Reports that she only eats eggs in the am and at dinner she eats vegetables and protein. Maintaining weight

## 2014-06-30 NOTE — Progress Notes (Signed)
[name] [name]  [phone] [phone]     ID: [name] [name] OB: 12-Feb-1961  MR#: [id number]  TMH#:[id number]  PCP: Pcp Not In System GYN:  [name] [name] SU:  OTHER MD: [name] [name], [name], [name] [name], [name] [name], [name] [name]  CHIEF COMPLAINT: Stage IV breast cancer  CURRENT TREATMENT: exemestane  BREAST CANCER HISTORY: From doctor [name] [name]'s intake note 03/20/2004:  "The patient is a very pleasant 54 year old female, without significant past medical history.  Her family history is significant for a sister who at age 63 was diagnosed with invasive ductal carcinoma.  She is a breast cancer survivor at age 29 now.  The patient states that she has never really had a screening mammogram until October 2005, when she felt that it was time for her to start having mammograms done on a yearly basis.  Therefore, on 01/26/04, she underwent a screening mammogram and an abnormality was detected in the upper outer right breast.  She, therefore, underwent spot compression views of both the right and the left breast.  The left breast revealed a well-defined mass in the upper outer left quadrant, present at the 2 o'clock position, measuring 1.8 cm, 6 cm from the nipple.  This, by ultrasound, was felt to be a simple cyst measuring 1.8 cm.  On the right breast, a spiculated mass was noted in the upper outer right quadrant.  The ultrasound revealed a shadowing irregular solid mass at the 10:30 position, 9 cm from the nipple, measuring 1.2 cm in greatest dimension, correlating with the spiculated mass seen on the mammogram.  The right axilla was negative ultrasonically.  Because of this, the patient underwent a needle biopsy of the right breast and the biopsy was positive invasive mammary carcinoma that showed features consistent with a high-grade invasive ductal carcinoma associated with desmoplastic stroma.  No in situ  component was seen and no definite lymphovascular invasion was identified.  On the core biopsy, the tumor measured about 0.8 cm.  Because of this, she was seen by Dr. [name] and the patient was taken to the [facility] on March 15, 2004.  She underwent a right breast lumpectomy with sentinel node biopsy.  The final pathology revealed an invasive ductal carcinoma, measuring 1.7 cm, grade 2 of 3.  Margins were free of tumor.  Atypical lobular hyperplasia was noted.  One sentinel node was removed which was negative for metastatic disease.  The tumor was staged at T1c, N0 MX.  It was estrogen receptor positive, progesterone receptor positive.  HER-2/neu was 2+.  FISH was negative.  All margins were free of tumor.  She is now seen in Medical Oncology for further evaluation and management of this newly diagnosed T1c, node negative, stage I, invasive ductal carcinoma of the right breast."  Her subsequent history is as detailed below  INTERVAL HISTORY: [name] [name] returns today for followup of her stage IV breast cancer accompanied by her husband [name] [name]. Since the last visit here she had a chest x-ray which showed no significant change [name] [name] but the radiologist went the extra mile and reviewed the prior CT scans which show an area of left wall involvement. We went ahead and repeated a chest CT at this point and [name] [name] is here to discuss those results.  REVIEW OF SYSTEMS: [name] [name] continues on exemestane which she is tolerating well. She has significant low back pain at times, but this is very intermittent. They tend to occur more in the  evening. They're relieved by massage. She continues to have pain in the left anterior chest wall. She has her left lung drained twice a week through her Pleurx. She is getting about 400 mL out total weekly. Usually it does not hurt when she has the drainage procedure. Her breathing is "pretty good" and she walks 2-3 days a week, 2 miles at a time. Otherwise a detailed review of systems today  was stable  PAST MEDICAL HISTORY: Past Medical History  Diagnosis Date  . Seizures 2010    Isolated incident.  . Breast cancer dx'd 2005/2011  . PONV (postoperative nausea and vomiting)   . Peripheral vascular disease 02/2010    blood clot related to porta cath    PAST SURGICAL HISTORY: Past Surgical History  Procedure Laterality Date  . Breast lumpectomy  2005  . Axillary lymph node dissection  Dec. 2011  . Portacath placement  12/11  . Removal portacath    . Mediastinotomy chamberlain mcneil Left 06/02/2013    Procedure: MEDIASTINOTOMY CHAMBERLAIN MCNEIL;  Surgeon: [name] Nakayama, MD;  Location: [facility] OR;  Service: Thoracic;  Laterality: Left;  LEFT ANTERIOR MEDIASTINOTOMY     FAMILY HISTORY Family History  Problem Relation Age of Onset  . COPD Mother   . Breast cancer Sister    The patient's father is living, 10 years old as of may 2015. He lives in [place]. The patient's mother died from complications of COPD at the age of 28. These has 2 brothers, one sister. Her sister developed breast cancer at the age of 26. She is doing well. The patient herself underwent genetic testing at [facility] in 2011 and was found to be BRCA negative  GYNECOLOGIC HISTORY:  Menarche age 51, she is GX P0. She stopped having periods with her initial chemotherapy in 2006.  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 Her husband [name] [name] is a Medical illustrator in [place]. He has a child from a prior marriage. At home they have 2 rescue dogs, [name] [name] and [name] [name]. The patient is religious but not a church attender    ADVANCED DIRECTIVES: In place   HEALTH MAINTENANCE: History  Substance Use Topics  . Smoking status: Never Smoker   . Smokeless tobacco: Never Used  . Alcohol Use: No     Colonoscopy:  PAP:  Bone density: March 2015; mild osteopenia  Lipid panel:  Allergies  Allergen Reactions  . Decadron [Dexamethasone]  Other (See Comments)    Patient does not tolerate steroids.   . Enoxaparin Other (See Comments)    unknown  . Fluconazole Swelling  . Hydromorphone Hcl Nausea And Vomiting  . Morphine And Related   . Tegaderm Ag Mesh [Silver]     Current Outpatient Prescriptions  Medication Sig Dispense Refill  . ALPRAZolam (XANAX) 0.5 MG tablet Take 1 tablet (0.5 mg total) by mouth 2 (two) times daily as needed for anxiety. 30 tablet 0  . b complex vitamins capsule Take 1 capsule by mouth daily.    . Calcium-Magnesium-Vitamin D (CITRACAL CALCIUM+D PO) Take by mouth. 461m vitamin C, 5061mvitamin D3 2 PO DAILY    . cholecalciferol (VITAMIN D) 1000 UNITS tablet Take 1 tablet (1,000 Units total) by mouth daily.    . Marland Kitchenxemestane (AROMASIN) 25 MG tablet Take 25 mg by mouth daily after breakfast.   4  . folic acid (FOLVITE) 1 MG tablet Take 1 mg by mouth daily.    .Marland Kitchen  ibuprofen (ADVIL,MOTRIN) 200 MG tablet Take 200 mg by mouth every 4 (four) hours as needed.    . Investigational palbociclib 75 MG capsule SWOG S1400 Sub-Study C Take 1 capsule (75 mg total) by mouth every other day. Take with food. Swallow whole. Do not chew. 11 capsule 4  . Melatonin 1 MG TABS Take 3 mg by mouth at bedtime as needed (sleep).     . [DISCONTINUED] metoCLOPramide (REGLAN) 10 MG tablet Take 1 tablet (10 mg total) by mouth 4 (four) times daily -  before meals and at bedtime. 60 tablet 3   No current facility-administered medications for this visit.    OBJECTIVE: Middle-aged white [name] who appears stated age There were no vitals filed for this visit.   Body mass index is 0.00 kg/(m^2).   There were no vitals filed for this visit. Patient refuses weight and vitals; this was discussed at the 06/30/2014 visit. She feels her blood pressure is taken twice a week at home by the home nurse and it is usually in the 120/80 range. When she comes here she is anxious and we get "falsely elevated readings". I suggested she bring [place] a copy of the  readings at home and of course her weight would not change in the same way as the blood pressure so she is agreeable to being weighed at the next visit.    ECOG FS:1 - Symptomatic but completely ambulatory  Sclerae unicteric, pupils round and equal Oropharynx clear, teeth in good repair No cervical or supraclavicular adenopathy Lungs good breath sounds on the right, no wheezes or crackles on the left Heart regular rate and rhythm Abd soft, nontender, positive bowel sounds MSK no focal spinal tenderness Neuro: nonfocal, well oriented, appropriate affect Breasts: Deferred   LAB RESULTS: Results for BROOK, GERACI (MRN [id number]) as of 07/01/2014 13:30  Ref. Range 04/17/2014 11:45 04/24/2014 11:54 05/01/2014 11:32 05/30/2014 11:43 06/29/2014 16:15  AST Latest Range: 0-37 U/L 81 (H) 95 (H) 51 (H) 39 (H) 32  ALT Latest Range: 0-35 U/L 131 (H) 169 (H) 107 (H) 84 (H) 58 (H)    CMP     Component Value Date/Time   NA 144 06/29/2014 1615   NA 134* 09/03/2013 2145   K 4.4 06/29/2014 1615   K 3.7 09/03/2013 2145   CL 97 09/03/2013 2145   CL 105 05/06/2012 1333   CO2 25 06/29/2014 1615   CO2 23 09/03/2013 2145   GLUCOSE 80 06/29/2014 1615   GLUCOSE 121* 09/03/2013 2145   GLUCOSE 124* 05/06/2012 1333   BUN 14.2 06/29/2014 1615   BUN 8 09/03/2013 2145   CREATININE 0.8 06/29/2014 1615   CREATININE 0.64 09/03/2013 2145   CALCIUM 10.5* 06/29/2014 1615   CALCIUM 9.0 09/03/2013 2145   PROT 7.5 06/29/2014 1615   PROT 6.8 09/03/2013 2145   ALBUMIN 4.1 06/29/2014 1615   ALBUMIN 3.3* 09/03/2013 2145   AST 32 06/29/2014 1615   AST 55* 09/03/2013 2145   ALT 58* 06/29/2014 1615   ALT 85* 09/03/2013 2145   ALKPHOS 74 06/29/2014 1615   ALKPHOS 114 09/03/2013 2145   BILITOT 0.39 06/29/2014 1615   BILITOT 0.3 09/03/2013 2145   GFRNONAA >90 09/03/2013 2145   GFRAA >90 09/03/2013 2145    No results found for: SPEP  Lab Results  Component Value Date   WBC 7.4 06/29/2014   NEUTROABS 5.1  06/29/2014   HGB 14.5 06/29/2014   HCT 42.2 06/29/2014   MCV 94.6 06/29/2014  PLT 298 06/29/2014      Chemistry      Component Value Date/Time   NA 144 06/29/2014 1615   NA 134* 09/03/2013 2145   K 4.4 06/29/2014 1615   K 3.7 09/03/2013 2145   CL 97 09/03/2013 2145   CL 105 05/06/2012 1333   CO2 25 06/29/2014 1615   CO2 23 09/03/2013 2145   BUN 14.2 06/29/2014 1615   BUN 8 09/03/2013 2145   CREATININE 0.8 06/29/2014 1615   CREATININE 0.64 09/03/2013 2145      Component Value Date/Time   CALCIUM 10.5* 06/29/2014 1615   CALCIUM 9.0 09/03/2013 2145   ALKPHOS 74 06/29/2014 1615   ALKPHOS 114 09/03/2013 2145   AST 32 06/29/2014 1615   AST 55* 09/03/2013 2145   ALT 58* 06/29/2014 1615   ALT 85* 09/03/2013 2145   BILITOT 0.39 06/29/2014 1615   BILITOT 0.3 09/03/2013 2145       Lab Results  Component Value Date   LABCA2 25 11/02/2007    No components found for: [id number]  No results for input(s): INR in the last 168 hours.  Urinalysis    Component Value Date/Time   COLORURINE YELLOW 09/03/2013 2109   APPEARANCEUR CLEAR 09/03/2013 2109   LABSPEC 1.005 09/12/2013 1542   LABSPEC 1.016 09/03/2013 2109   PHURINE 6.0 09/03/2013 2109   GLUCOSEU Negative 09/12/2013 [place] 09/03/2013 2109   HGBUR NEGATIVE 09/03/2013 2109   [place] NEGATIVE 09/03/2013 2109   [place] NEGATIVE 09/03/2013 2109   PROTEINUR NEGATIVE 09/03/2013 2109   UROBILINOGEN 0.2 09/12/2013 1542   UROBILINOGEN 0.2 09/03/2013 2109   NITRITE Negative 09/12/2013 1542   NITRITE NEGATIVE 09/03/2013 2109   LEUKOCYTESUR NEGATIVE 09/03/2013 2109    STUDIES: Dg Chest 2 View  06/19/2014   CLINICAL DATA:  Increasing soft tissue fullness in the left upper chest. Increased discomfort in that area. Metastatic breast cancer.  EXAM: CHEST  2 VIEW  COMPARISON:  05/30/2014 and 05/10/2014 and CT scan of the chest dated 05/15/2014 as well as PET CTs dated 03/29/2014 and 01/25/2014  FINDINGS:  Tumor along the left side of the mediastinum is again noted. There is a nodular pleural metastasis anteriorly in the left midzone as demonstrated on prior scans. There is a small to moderate left pleural effusion, unchanged. PleurX catheter in place.  Right lung remains clear. Heart size and vascularity are normal. No acute osseous abnormality. Thoracolumbar scoliosis. There is a old fracture of the lateral aspect of the left seventh rib.  Review of the prior CT scans does demonstrate that the patient has metastatic disease in the left pectoralis major and minor muscles extending into the subcutaneous soft tissues toward the midline superficial to the left lateral border of the sternum. I suspect that this is the palpable abnormality.  IMPRESSION: No significant change in the appearance of the chest x-rays since the prior exam of 05/30/2014. The patient does have metastatic disease in the anterior soft tissues of the left chest including the pectoralis major and minor muscles and subcutaneous fat just to the left of the sternum.   Electronically Signed   By: Lorriane Shire M.D.   On: 06/19/2014 14:04   Ct Chest W Contrast  06/29/2014   CLINICAL DATA:  Restaging stage IV metastatic breast cancer, chemotherapy and radiation therapy complete. Right arm lymphedema. Subsequent encounter.  EXAM: CT CHEST WITH CONTRAST  TECHNIQUE: Multidetector CT imaging of the chest was performed during intravenous contrast administration.  CONTRAST:  68m OMNIPAQUE IOHEXOL 300 MG/ML  SOLN  COMPARISON:  05/15/2014.  FINDINGS: Mediastinum/Nodes: Heterogeneous subcarinal lymph node measures 1.5 cm, stable. No hilar or axillary adenopathy. Surgical clips in the right axilla. Heart size normal. No pericardial effusion.  Lungs/Pleura: There is extensive pleural nodularity in the left hemi thorax with a conglomerate mass along the left mediastinum, measuring approximately 3.7 x 6.0 cm (previously 3.2 x 5.5 cm). PleurX catheter terminates at  the medial apex of the left hemi thorax. A subpleural nodule in the posterior left lower lobe (image 37), measures 1.4 cm (previously 1.0 cm). Small left pleural effusion. Airway is unremarkable.  Upper abdomen: Liver may be slightly decreased in attenuation diffusely. Visualized portions of the liver, gallbladder, adrenal glands, kidneys, spleen, pancreas, stomach and bowel are otherwise grossly unremarkable. Upper abdominal lymph nodes measure up to 10 mm in short axis in the left periaortic station. (series 2, image 57), stable.  Musculoskeletal: Irregularity and destruction of the lateral left seventh rib is noted (series 2, image 39). There may be an associated fracture. Finding is progressive from 05/15/2014. A nodular lesion along the medial aspect of the left breast measures 1.5 x 2.3 cm, grossly stable.  IMPRESSION: 1. Pleural metastatic disease in the left hemi thorax appears slightly progressive. Associated small left pleural effusion. 2. Left seventh rib metastasis appears progressive. Likely associated fracture. 3. Stable upper abdominal adenopathy. 4. Question mild hepatic steatosis.   Electronically Signed   By: MLorin PicketM.D.   On: 06/29/2014 16:50    ASSESSMENT: 54y.o. [name] [name] with stage IV breast cancer, history as follows  (1)  S/p Right lumpectomy and sentinel lymph node sampling 03/15/2004 for a pT1c pN0. Stage IA invasive ductal carcinoma, grade 2, estrogen receptor 95% positive, progesterone receptor 65% positive, HER-2 not amplified; additional surgery 04/25/2004 for seroma or clearance showed no residual tumor  (2) adjuvant chemotherapy with cyclophosphamide and doxorubicin every 21 days x4 completed 07/19/2004  (3) adjuvant radiation given under Dr. CDonella Stadein BWebbcompleted July 2006  (4) the patient opted against adjuvant antiestrogen therapy  (5) genetics testing showed no BRCA mutations  (6) biopsy of a palpable right axillary mass 10/24/2009 showed  invasive ductal carcinoma, grade 3, estrogen receptor 100% positive, progesterone receptor 2% positive (alert score 5) HER-2 negative; no evidence of systemic disease on PET scanning  (7) completed 3 of 4 planned cycles of docetaxel and cyclophosphamide September 2011, fourth cycle omitted because of marked elevations in liver function tests  (8) an right axillary lymph node dissection 03/06/2010 showed 3/8 lymph nodes removed to be involved by tumor, with extracapsular extension.  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, TTS-1 negative), estrogen receptor 80% positive, progesterone receptor 2% positive, HER-2 not amplified  (12) letrozole started March 2015-- tolerated with significant side effects, discontinued at the end of May 2015  (13) PET scan 08/16/2013 shows extensive left pleural metastatic disease and a large left pleural effusion that shifts cardiac and mediastinal structures to the right; adenopathy (celiac trunk, periadrenal, periaortic); and a left medial clavicular lesion; Status post left thoracentesis 08/16/2013 positive for adenocarcinoma, estrogen receptor positive, progesterone receptor negative.  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 mutation; everolimus was suggested as an available targeted agent  (18) exemestane started 03/31/2014  (a) everolimus added 04/03/2014 but not tolerated (cytopenias, elevated LFTs) even at minimal doses; stopped 04/17/2014    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 She has done a great deal of research into studies that may be available for someone like her and is developing I think a very sophisticated understanding of what is required to participate in a study, which eligibility criteria she may meet or not meet, and the differences between phase I and phase II studies. She will continue to investigate these and is particularly interested in a study that may become available at [facility], which would be close enough for her to drive to if necessary.  We looked at the recent CT images which show the area in the left anterior chest wall where she is having pain. She is going to meet with Dr. Valere Dross next week. I think it may be feasible for a brief radiation course to control the cancer in this particular spot without damaging her functional status in any way. Of course we will defer to his opinion on this.  We then discussed Palbociclib. She did receive the  prescription several months back but never started it. She is very concerned because she has been so sensitive to drugs in the past. After much discussion we decided we would try Palbociclib at 75 mg every 3 days, with the first dose planned for April 18, by which time I anticipate if she is receiving any radiation that will be completed. She will have lab work every week and if after 2 weeks her counts are holding up well we will go to every other day Palbociclib and do that for a month before considering a further increase in dose. She is going to see me in late May around that point. I am not planning to repeat a CT of the chest for at least 2 months after she starts the [place] and [name] [name] have a good understanding of this plan. They agree to it. Zadie knows to call with any problems that may develop for her next visit here   Chauncey Cruel, MD   06/30/2014 9:14 AM

## 2014-07-03 ENCOUNTER — Ambulatory Visit: Payer: 59 | Admitting: Physical Therapy

## 2014-07-03 DIAGNOSIS — M6289 Other specified disorders of muscle: Secondary | ICD-10-CM

## 2014-07-03 DIAGNOSIS — R0789 Other chest pain: Secondary | ICD-10-CM

## 2014-07-03 DIAGNOSIS — I890 Lymphedema, not elsewhere classified: Secondary | ICD-10-CM

## 2014-07-03 NOTE — Therapy (Signed)
[place], [place], [address] Phone: [phone]   Fax:  [phone]  Physical Therapy Treatment  Patient Details  Name: [name] [name] MRN: [id number] Date of Birth: [birth year] Referring Provider:  Consuela Mimes, MD  Encounter Date: 07/03/2014      PT End of Session - 07/03/14 1702    Visit Number 28   Number of Visits 82   Date for PT Re-Evaluation 09/08/14   PT Start Time 1025   PT Stop Time 1100   PT Time Calculation (min) 35 min      Past Medical History  Diagnosis Date  . Seizures 2010    Isolated incident.  . Breast cancer dx'd 2005/2011  . PONV (postoperative nausea and vomiting)   . Peripheral vascular disease 02/2010    blood clot related to porta cath    Past Surgical History  Procedure Laterality Date  . Breast lumpectomy  2005  . Axillary lymph node dissection  Dec. 2011  . Portacath placement  12/11  . Removal portacath    . Mediastinotomy chamberlain mcneil Left 06/02/2013    Procedure: MEDIASTINOTOMY CHAMBERLAIN MCNEIL;  Surgeon: [name], MD;  Location: [facility];  Service: Thoracic;  Laterality: Left;  LEFT ANTERIOR MEDIASTINOTOMY     There were no vitals filed for this visit.  Visit Diagnosis:  Lymphedema  Other chest pain  Muscle stiffness      Subjective Assessment - 07/03/14 1024    Subjective Thursday, Friday, and Saturday nights were very bad for pain at left mid-back.   Currently in Pain? Yes   Pain Score 4    Pain Location Flank   Pain Orientation Left   Pain Descriptors / Indicators Tender   Aggravating Factors  touch, at night due to fatigue                       [facility] Adult PT Treatment/Exercise - 07/03/14 0001    Manual Therapy   Manual Lymphatic Drainage (MLD) In left sidelying, posterior interaxillary anastomosis and right axillo-inguinal anastomosis; in supine, short neck, left axilla and anterior interaxillary  anastomosis, right groin and axillo-inguinal anastomosis, area between right breast scars, directing toward pathways.  In right sidelying, left periscapular area toward left groin.   Manual Therapy   Massage in right sidelying, to left upper back and neck   Myofascial Release At right axillary scar with soft tissue mobilization and myofascial release.                        [facility] Goals - 06/30/14 1137    CC Long Term Goal  #1   Status On-going   CC Long Term Goal  #2   Status On-going            Plan - 07/03/14 1702    Clinical Impression Statement patient 10 mins. late for appointment today; all treatments done but shortened slightly; increased induration at area between scars at right lateral breast   Pt will benefit from skilled therapeutic intervention in order to improve on the following deficits Increased edema;Pain;Increased fascial restricitons   Rehab Potential Good   PT Treatment/Interventions Manual lymph drainage;Manual techniques;Scar mobilization   PT Next Visit Plan Manual lymph dainage, myofascial release, soft tissue work; reassess        Problem List Patient Active Problem List   Diagnosis Date Noted  . 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  Chest wall pain 03/21/2014  . Abnormal LFTs (liver function tests) 09/12/2013  . Pleural effusion 08/18/2013  . 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 Breast cancer metastasized to multiple sites 08/18/2013    [name],[name] 07/03/2014, 5:04 PM  [place], [place], [address] Phone: [phone]   Fax:  [place], PT 07/03/2014 5:04 PM

## 2014-07-04 ENCOUNTER — Encounter: Payer: Self-pay | Admitting: Radiation Oncology

## 2014-07-04 ENCOUNTER — Ambulatory Visit
Admission: RE | Admit: 2014-07-04 | Discharge: 2014-07-04 | Disposition: A | Discharge status: Home / Self Care | Payer: 59 | Source: Ambulatory Visit | Attending: Radiation Oncology | Admitting: Radiation Oncology

## 2014-07-04 ENCOUNTER — Telehealth: Payer: Self-pay | Admitting: *Deleted

## 2014-07-04 VITALS — BP 126/79 | HR 99 | Resp 16 | Ht 63.0 in | Wt 175.5 lb

## 2014-07-04 DIAGNOSIS — C50912 Malignant neoplasm of unspecified site of left female breast: Secondary | ICD-10-CM

## 2014-07-04 DIAGNOSIS — C50211 Malignant neoplasm of upper-inner quadrant of right female breast: Secondary | ICD-10-CM | POA: Diagnosis not present

## 2014-07-04 NOTE — Progress Notes (Signed)
CC: Dr. 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 Ohlsen is a pleasant 54 year old female who is seen today at the request of Dr. Jana Hakim for a follow-up visit and consideration of palliative radiotherapy in the management of her metastatic carcinoma the breast.  I first saw her in consultation on 04/06/2014.  Please see my history from January 7 for details of her presentation.  At that time she had been taking Motrin primarily for left upper parasternal discomfort and left lower anterolateral chest wall discomfort adjacent to her catheter site.  Her pain was 2-3/10.  We obtain her previous radiation therapy records from Dr. Baruch Gouty in [place] detailing delivery of 5040 cGy to her right breast with a 1440 cGy boost to the upper outer quadrant right breast tumor bed in 2006.  We also have records from [place] at [facility] where she received postoperative radiation therapy to her right axilla and clavicular region following an axillary dissection with 3 of 8 lymph nodes positive for metastatic disease.  She received radiation therapy to her right clavicular region/axilla in early 2012.  She was treated with LAO/RPO fields to encompass the previously untreated right supraclavicular fossa/axilla, however, there was some field overlap with her tangential fields.  She received 4500 cGy in 25 sessions with concurrent Xeloda.  She received treatment from 04/30/2010 through 06/03/2010.  Since I last saw her, she was started on exemestane by Dr. Jana Hakim.  He added everolimus but this was poorly tolerated and discontinued.  Her most recent CT scan of the chest on 06/29/2014, compared with her CT from 05/15/2014, showed slight progression of her left pleural disease, left upper mediastinal disease, soft tissue parasternal disease, and now what appears to be progression of a left lateral  seventh rib metastasis.  I feel that the left seventh rib is secondarily involved by direct extension from her pleural disease.  She describes her left parasternal discomfort and left posterior lateral chest wall discomfort as being 4-5 out of 10.  She refuses to take narcotics and has been controlling her discomfort as best she can with Motrin.  She drains approximately 400 mL of pleural fluid weekly from her Pleurx catheter.  Dr. Dr. Jana Hakim discussed with her the option of Palbociclib targeted therapy, but she would like to wait until she is had palliative radiotherapy.  She feels strongly that she needs to have something positive happen in knowing that radiation therapy has helped her in the past.  Of note is that she is gaining approximately 35 pounds over the past few months.  Physical examination: Alert and oriented. Filed Vitals:   07/04/14 0904  BP: 126/79  Pulse: 99  Resp: 16   Head and neck examination: Grossly unremarkable.  Nodes: There is no palpable cervical, supraclavicular, or axillary lymphadenopathy.  Chest: Decreased breath sounds along the left mid lower lung zone.  There is discomfort described along the left upper parasternal region with a 1-2 cm soft tissue mass which is tender to palpation.  She describes pain along the left lateral and posterior lateral chest wall at approximately the level of the seventh rib.  There is no point tenderness on palpation of the left chest wall.  Breasts: There is moderate fibrosis in the right breast and neck and axilla with no palpable evidence for recurrent disease.  Extremities: There is mild right upper extremity lymphedema.  Neurologic examination: Grossly nonfocal.  Laboratory data: Lab Results  Component Value Date   WBC 7.4 06/29/2014   HGB 14.5 06/29/2014   HCT 42.2 06/29/2014   MCV 94.6 06/29/2014   PLT 298 06/29/2014   Impression: Metastatic carcinoma the breast him a primarily involving the left hemithorax.  We reviewed her 2  previous CT scans, and PET scans in great detail.  I feel that she is symptomatic from her left parasternal disease, primarily along the anterior soft tissues.  She may be symptomatic from her left seventh rib lesion, but discomfort felt posteriorly is probably from her pleural-based disease.  I explained her that she could move ahead with a trial of  Palbociclib systemic therapy and hold off on radiation therapy but she is adamant about getting some degree of palliation from radiation therapy.  She will like to have treatment of her left parasternal disease and also left lateral chest wall/rib.  I would encompass her left upper mediastinal disease which has also progressed at the same time as treatment to the soft tissue disease along the left parasternal region.  I would give her focal treatment to her left lateral seventh rib where she has a destructive lesion.  I explained her that radiation therapy is generally not given for pleural-based disease because of pulmonary toxicity.  We discussed the potential acute and late toxicities of radiation therapy which should be reasonably well tolerated.  There are may be minimal overlap with her medial right supraclavicular field.  We will proceed with 3-D planning for dose conformity.  I discussed the potential acute and late toxicities of radiation therapy.  Consent is signed today.  I anticipate 2 weeks of radiation therapy.   Plan: She will return later this week or early next week for CT simulation.  45 minutes was spent face-to-face counseling the patient, reviewing her previous diagnostic studies with her, and coordinating her care.

## 2014-07-04 NOTE — Addendum Note (Signed)
Encounter addended by: Benn Moulder, RN on: 07/04/2014  1:58 PM<BR>     Documentation filed: Charges VN

## 2014-07-04 NOTE — Telephone Encounter (Signed)
This RN spoke with pt post her appointment with RAD ONC - [name] [name] states she will not start the Ibrance until May 1- therefore labs scheduled for 4/18 and 4/25 will be cancelled.  At present labs for May will remain until Rad appointments are verified.  Noted as well that weight at cardiology appointment was documented as 140 in Feb 2016 Pt has declined weight at office visit with last known weight of 170 documented greater then 1 year ago. Weight today per Rad Onc appointment is 171 - concern regarding noted weight change.  Per discussion with pt she states she thought her weight at cardiologist was 160 - she also states she " feels like I have lost weight in some areas but am puffy in others "  [name] [name] states the steroids ( endocrine therapy - exemustane) is causing her to have flucuations in weight distribution. More descriptive is she feels weight loss in abd area but more " puffy" in breast and upper chest and neck.  Pt understands probable need for continued weight checks for appropriate monitoring.

## 2014-07-05 ENCOUNTER — Telehealth: Payer: Self-pay

## 2014-07-05 NOTE — Telephone Encounter (Signed)
Pharmacy called to verify 11 ibrance is correct quantity. Called back and confirmed 11 is correct quantity b/c pt is very sensitive to meds and the plan is to start out slow and see how she does before increasing.

## 2014-07-07 ENCOUNTER — Ambulatory Visit: Payer: 59 | Admitting: Physical Therapy

## 2014-07-07 DIAGNOSIS — R0789 Other chest pain: Secondary | ICD-10-CM

## 2014-07-07 DIAGNOSIS — I890 Lymphedema, not elsewhere classified: Secondary | ICD-10-CM

## 2014-07-07 DIAGNOSIS — M6289 Other specified disorders of muscle: Secondary | ICD-10-CM

## 2014-07-07 NOTE — Therapy (Signed)
[place], [place], [address] Phone: [phone]   Fax:  [phone]  Physical Therapy Treatment  Patient Details  Name: [name] [name] MRN: [id number] Date of Birth: October 18, 1960 Referring Provider:  Consuela Mimes, MD  Encounter Date: 07/07/2014      PT End of Session - 07/07/14 1323    Visit Number 29   Number of Visits 47   Date for PT Re-Evaluation 09/08/14   PT Start Time 1022   PT Stop Time 1104   PT Time Calculation (min) 42 min   Activity Tolerance Patient tolerated treatment well   Behavior During Therapy [facility] for tasks assessed/performed      Past Medical History  Diagnosis Date  . Seizures 2010    Isolated incident.  . Breast cancer dx'd 2005/2011  . PONV (postoperative nausea and vomiting)   . Peripheral vascular disease 02/2010    blood clot related to porta cath    Past Surgical History  Procedure Laterality Date  . Breast lumpectomy  2005  . Axillary lymph node dissection  Dec. 2011  . Portacath placement  12/11  . Removal portacath    . Mediastinotomy chamberlain mcneil Left 06/02/2013    Procedure: MEDIASTINOTOMY CHAMBERLAIN MCNEIL;  Surgeon: [name], MD;  Location: [facility];  Service: Thoracic;  Laterality: Left;  LEFT ANTERIOR MEDIASTINOTOMY     There were no vitals filed for this visit.  Visit Diagnosis:  Lymphedema  Other chest pain  Muscle stiffness      Subjective Assessment - 07/07/14 1025    Subjective Something new--left knee pain.  Had a massage on Wednesday this week.  Swelling is greater today.  Had one of the severe pain episodes on Tuesday.   Currently in Pain? Yes   Pain Score 5    Pain Location Flank   Pain Orientation Left  + right axilla   Pain Relieving Factors ibuprofen                       [facility] Adult PT Treatment/Exercise - 07/07/14 0001    Manual Therapy   Manual Lymphatic Drainage (MLD) In left  sidelying, posterior interaxillary anastomosis and right axillo-inguinal anastomosis; in supine, short neck, left axilla and anterior interaxillary anastomosis, right groin and axillo-inguinal anastomosis, area between right breast scars, directing toward pathways.  In right sidelying, left periscapular area toward left groin.   Manual Therapy   Massage At right axillary scar with soft tissue mobilization and myofascial release.   Myofascial Release At right axillary scar with soft tissue mobilization and myofascial release.                        [facility] Goals - 07/07/14 1328    CC Long Term Goal  #1   Status On-going   CC Long Term Goal  #2   Status On-going            Plan - 07/07/14 1323    Clinical Impression Statement Patient with increasing pain recently; met with radiation oncologist and plans to have short course of radiation for palliation of left pectoral and rib pain soon.  Induration at right lateral breast and left upper back increased the week.  Pt. does continue to report partial relief of pain with therapy.   Pt will benefit from skilled therapeutic intervention in order to improve on the following deficits Increased edema;Pain;Increased fascial restricitons  Rehab Potential Good   Clinical Impairments Affecting Rehab Potential active cancer   PT Frequency 2x / week   PT Duration 6 weeks   PT Treatment/Interventions Manual lymph drainage;Manual techniques;Scar mobilization   PT Next Visit Plan Manual lymph dainage, myofascial release, soft tissue work; reassess   Consulted and Agree with Plan of Care Patient        Problem List Patient Active Problem List   Diagnosis Date Noted  . 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 Chest wall pain 03/21/2014  . Abnormal LFTs (liver function tests) 09/12/2013  . Pleural effusion 08/18/2013  . 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 Breast cancer  metastasized to multiple sites 08/18/2013    [name],[name] 07/07/2014, 1:29 PM  [place], [place], [address] Phone: [phone]   Fax:  Mucarabones, PT 07/07/2014 1:29 PM

## 2014-07-10 ENCOUNTER — Encounter: Payer: Self-pay | Admitting: Oncology

## 2014-07-10 ENCOUNTER — Ambulatory Visit
Admission: RE | Admit: 2014-07-10 | Discharge: 2014-07-10 | Disposition: A | Discharge status: Home / Self Care | Payer: 59 | Source: Ambulatory Visit | Attending: Radiation Oncology | Admitting: Radiation Oncology

## 2014-07-10 ENCOUNTER — Ambulatory Visit: Payer: 59 | Admitting: Physical Therapy

## 2014-07-10 ENCOUNTER — Other Ambulatory Visit: Payer: Self-pay | Admitting: Oncology

## 2014-07-10 DIAGNOSIS — M6289 Other specified disorders of muscle: Secondary | ICD-10-CM

## 2014-07-10 DIAGNOSIS — C50211 Malignant neoplasm of upper-inner quadrant of right female breast: Secondary | ICD-10-CM | POA: Insufficient documentation

## 2014-07-10 DIAGNOSIS — C50912 Malignant neoplasm of unspecified site of left female breast: Secondary | ICD-10-CM

## 2014-07-10 DIAGNOSIS — R0789 Other chest pain: Secondary | ICD-10-CM

## 2014-07-10 DIAGNOSIS — I890 Lymphedema, not elsewhere classified: Secondary | ICD-10-CM

## 2014-07-10 NOTE — Progress Notes (Addendum)
Complex simulation/treatment planning note: The patient was placed on the CT simulation table supine with construction of Vac-Lok immobilization on a wing board with her arms extended.  She was then scanned.  The CT data set was sent to the  MIM planning system where I contoured CTV 1, her mediastinal disease CTV, and also CTV 2, her left rib CTV.  Both CTV's will be expanded by 0.8 cm to create PTV 30 which will receive 3000 cGy in 10 sessions with 6 MV photons.  She is now ready for 3-D planning for helical Tomotherapy.  Dosimetry will contour her normal chest structures.

## 2014-07-10 NOTE — Therapy (Signed)
[place], [place], [address] Phone: [phone]   Fax:  [phone]  Physical Therapy Treatment  Patient Details  Name: [name] [name] MRN: [id number] Date of Birth: 07/09/1960 Referring Provider:  Consuela Mimes, MD  Encounter Date: 07/10/2014      PT End of Session - 07/10/14 1236    Visit Number 30   Number of Visits 47   Date for PT Re-Evaluation 09/08/14   PT Start Time 1021   PT Stop Time 1104   PT Time Calculation (min) 43 min   Activity Tolerance Patient tolerated treatment well   Behavior During Therapy [facility] for tasks assessed/performed      Past Medical History  Diagnosis Date  . Seizures 2010    Isolated incident.  . Breast cancer dx'd 2005/2011  . PONV (postoperative nausea and vomiting)   . Peripheral vascular disease 02/2010    blood clot related to porta cath    Past Surgical History  Procedure Laterality Date  . Breast lumpectomy  2005  . Axillary lymph node dissection  Dec. 2011  . Portacath placement  12/11  . Removal portacath    . Mediastinotomy chamberlain mcneil Left 06/02/2013    Procedure: MEDIASTINOTOMY CHAMBERLAIN MCNEIL;  Surgeon: [name], MD;  Location: [facility];  Service: Thoracic;  Laterality: Left;  LEFT ANTERIOR MEDIASTINOTOMY     There were no vitals filed for this visit.  Visit Diagnosis:  Lymphedema  Other chest pain  Muscle stiffness      Subjective Assessment - 07/10/14 1024    Subjective Left knee pain continuing to bother--thinks it's from the Exemestane.   Currently in Pain? Yes   Pain Score 3   up to 6   Pain Location Knee   Pain Orientation Left   Pain Type Acute pain   Pain Onset 1 to 4 weeks ago   Aggravating Factors  walking, moving certain ways   Pain Relieving Factors ibuprofen                       [facility] Adult PT Treatment/Exercise - 07/10/14 0001    Manual Therapy   Manual Lymphatic  Drainage (MLD) In left sidelying, posterior interaxillary anastomosis and right axillo-inguinal anastomosis; in supine, short neck, left axilla and anterior interaxillary anastomosis, right groin and axillo-inguinal anastomosis, area between right breast scars, directing toward pathways, and right upper arm.  In right sidelying, left periscapular area toward left groin.   Manual Therapy   Massage At right axillary scar with soft tissue mobilization and myofascial release.   Myofascial Release At right axillary scar with soft tissue mobilization and myofascial release.                        [facility] Goals - 07/07/14 1328    CC Long Term Goal  #1   Status On-going   CC Long Term Goal  #2   Status On-going            Plan - 07/10/14 1237    Clinical Impression Statement Patient with difficulty getting comfortable in supine especially today, with new c/o pain including left knee.  Induration at right lateral breast a little softer today than last week.   Pt will benefit from skilled therapeutic intervention in order to improve on the following deficits Increased edema;Pain;Increased fascial restricitons   PT Next Visit Plan Manual lymph dainage, myofascial release, soft tissue  work; reassess        Problem List Patient Active Problem List   Diagnosis Date Noted  . 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 Chest wall pain 03/21/2014  . Abnormal LFTs (liver function tests) 09/12/2013  . Pleural effusion 08/18/2013  . 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 Breast cancer metastasized to multiple sites 08/18/2013    [facility] 07/10/2014, 12:38 PM  [place], [place], [address] Phone: [phone]   Fax:  [place], PT 07/10/2014 12:39 PM

## 2014-07-11 ENCOUNTER — Encounter: Payer: Self-pay | Admitting: Radiation Oncology

## 2014-07-11 DIAGNOSIS — C50211 Malignant neoplasm of upper-inner quadrant of right female breast: Secondary | ICD-10-CM | POA: Diagnosis not present

## 2014-07-11 NOTE — Progress Notes (Signed)
3-D simulation note: The patient completed 3-D simulation for treatment to her left upper mediastinum and left lateral chest wall/seventh rib with mixed 10 MV and 15 MV photons.  She is setup to 3 field technique with unique MLCs which includes a reduced field for the RPO 15 MV photon field (3 complex treatment devices).  She is treated with 10 MV photons, LAO.  Dose volume histograms were obtained for the target structures and also the avoidance structures including the heart, lungs, esophagus, and spinal cord.  We met our departmental guidelines.  I am prescribing 3250 cGy and 13 sessions.

## 2014-07-12 DIAGNOSIS — C50211 Malignant neoplasm of upper-inner quadrant of right female breast: Secondary | ICD-10-CM | POA: Diagnosis not present

## 2014-07-14 ENCOUNTER — Ambulatory Visit: Payer: 59 | Admitting: Physical Therapy

## 2014-07-14 DIAGNOSIS — I890 Lymphedema, not elsewhere classified: Secondary | ICD-10-CM

## 2014-07-14 DIAGNOSIS — M6289 Other specified disorders of muscle: Secondary | ICD-10-CM

## 2014-07-14 DIAGNOSIS — R0789 Other chest pain: Secondary | ICD-10-CM

## 2014-07-14 NOTE — Therapy (Signed)
[place], [place], [address] Phone: [phone]   Fax:  [phone]  Physical Therapy Treatment  Patient Details  Name: [name] [name] MRN: [id number] Date of Birth: March 10, 1961 Referring Provider:  Consuela Mimes, MD  Encounter Date: 07/14/2014      PT End of Session - 07/14/14 1250    Visit Number 31   Number of Visits 47   Date for PT Re-Evaluation 09/08/14   PT Start Time 6063   PT Stop Time 1108   PT Time Calculation (min) 45 min   Activity Tolerance Patient tolerated treatment well   Behavior During Therapy [facility] for tasks assessed/performed      Past Medical History  Diagnosis Date  . Seizures 2010    Isolated incident.  . Breast cancer dx'd 2005/2011  . PONV (postoperative nausea and vomiting)   . Peripheral vascular disease 02/2010    blood clot related to porta cath    Past Surgical History  Procedure Laterality Date  . Breast lumpectomy  2005  . Axillary lymph node dissection  Dec. 2011  . Portacath placement  12/11  . Removal portacath    . Mediastinotomy chamberlain mcneil Left 06/02/2013    Procedure: MEDIASTINOTOMY CHAMBERLAIN MCNEIL;  Surgeon: [name], MD;  Location: [place] [facility];  Service: Thoracic;  Laterality: Left;  LEFT ANTERIOR MEDIASTINOTOMY     There were no vitals filed for this visit.  Visit Diagnosis:  Lymphedema  Other chest pain  Muscle stiffness      Subjective Assessment - 07/14/14 1029    Subjective Got radiation treatments scheduled.   Currently in Pain? Yes   Pain Score 4    Pain Location Flank   Pain Orientation Left  also right   Pain Relieving Factors motrin                       [facility] Adult PT Treatment/Exercise - 07/14/14 0001    Manual Therapy   Manual Lymphatic Drainage (MLD) In left sidelying, posterior interaxillary anastomosis and right axillo-inguinal anastomosis; in supine, short neck, left axilla  and anterior interaxillary anastomosis, right groin and axillo-inguinal anastomosis, area between right breast scars, directing toward pathways, and right upper arm.  In right sidelying, left periscapular area toward left groin.   Manual Therapy   Massage in right sidelying, to left upper back and neck   Myofascial Release At right axillary scar with soft tissue mobilization and myofascial release.                        [facility] Goals - 07/14/14 1252    CC Long Term Goal  #1   Status On-going   CC Long Term Goal  #2   Status On-going            Plan - 07/14/14 1250    Clinical Impression Statement Patient able to be more comfortable for treatment today; has appointments now to have radiation treatment for palliation of some left chest pain.  Fullness today from not having PleurX drained since Monday 4/11.   Pt will benefit from skilled therapeutic intervention in order to improve on the following deficits Increased edema;Pain;Increased fascial restricitons   Rehab Potential Good   Clinical Impairments Affecting Rehab Potential active cancer   PT Frequency 2x / week   PT Duration 6 weeks   PT Treatment/Interventions Manual lymph drainage;Manual techniques;Scar mobilization   PT Next Visit  Plan Manual lymph dainage, myofascial release, soft tissue work; reassess   Consulted and Agree with Plan of Care Patient        Problem List Patient Active Problem List   Diagnosis Date Noted  . 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 Chest wall pain 03/21/2014  . Abnormal LFTs (liver function tests) 09/12/2013  . Pleural effusion 08/18/2013  . 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 Breast cancer metastasized to multiple sites 08/18/2013    [facility] 07/14/2014, 12:53 PM  [place], [place], [address] Phone: [phone]   Fax:   [place], PT 07/14/2014 12:53 PM

## 2014-07-17 ENCOUNTER — Ambulatory Visit: Payer: 59 | Admitting: Physical Therapy

## 2014-07-17 ENCOUNTER — Ambulatory Visit (HOSPITAL_COMMUNITY): Payer: 59

## 2014-07-17 ENCOUNTER — Ambulatory Visit
Admission: RE | Admit: 2014-07-17 | Discharge: 2014-07-17 | Disposition: A | Discharge status: Home / Self Care | Payer: 59 | Source: Ambulatory Visit | Attending: Radiation Oncology | Admitting: Radiation Oncology

## 2014-07-17 ENCOUNTER — Other Ambulatory Visit: Payer: Self-pay

## 2014-07-17 ENCOUNTER — Ambulatory Visit: Payer: 59 | Admitting: Radiation Oncology

## 2014-07-17 ENCOUNTER — Telehealth: Payer: Self-pay | Admitting: *Deleted

## 2014-07-17 ENCOUNTER — Ambulatory Visit: Payer: Self-pay

## 2014-07-17 DIAGNOSIS — C50211 Malignant neoplasm of upper-inner quadrant of right female breast: Secondary | ICD-10-CM | POA: Diagnosis not present

## 2014-07-17 DIAGNOSIS — I890 Lymphedema, not elsewhere classified: Secondary | ICD-10-CM

## 2014-07-17 DIAGNOSIS — C50912 Malignant neoplasm of unspecified site of left female breast: Secondary | ICD-10-CM

## 2014-07-17 DIAGNOSIS — M6289 Other specified disorders of muscle: Secondary | ICD-10-CM

## 2014-07-17 DIAGNOSIS — R0789 Other chest pain: Secondary | ICD-10-CM

## 2014-07-17 NOTE — Telephone Encounter (Signed)
PT. WANTS TO CANCEL LAB FOR 07/31/14 AND 08/07/14. WILL KEEP LAB APPOINTMENT ON 08/14/14 AND START IMBRANCE. MOVE 08/21/14 APPOINTMENT TO LATE 08/24/14 SO PT.'S LAB WITH BE THE TENTH DAY OF IMBRANCE SO IF THERE ARE PROBLEMS WITH BLOOD WORK WILL KNOW BEFORE THE HOLIDAY TO AVOID PEOPLE. THIS NOTE ROUTED TO VAL DODD,RN.

## 2014-07-17 NOTE — Therapy (Signed)
[place], [place], [address] Phone: [phone]   Fax:  [phone]  Physical Therapy Treatment  Patient Details  Name: [name] [name] MRN: [id number] Date of Birth: 1961-03-03 Referring Provider:  Consuela Mimes, MD  Encounter Date: 07/17/2014      PT End of Session - 07/17/14 1404    Visit Number 32   Number of Visits 47   Date for PT Re-Evaluation 09/08/14   PT Start Time 1000   PT Stop Time 1100   PT Time Calculation (min) 60 min   Behavior During Therapy [facility] for tasks assessed/performed      Past Medical History  Diagnosis Date  . Seizures 2010    Isolated incident.  . Breast cancer dx'd 2005/2011  . PONV (postoperative nausea and vomiting)   . Peripheral vascular disease 02/2010    blood clot related to porta cath    Past Surgical History  Procedure Laterality Date  . Breast lumpectomy  2005  . Axillary lymph node dissection  Dec. 2011  . Portacath placement  12/11  . Removal portacath    . Mediastinotomy chamberlain mcneil Left 06/02/2013    Procedure: MEDIASTINOTOMY CHAMBERLAIN MCNEIL;  Surgeon: [name], MD;  Location: [facility];  Service: Thoracic;  Laterality: Left;  LEFT ANTERIOR MEDIASTINOTOMY     There were no vitals filed for this visit.  Visit Diagnosis:  Lymphedema  Other chest pain  Muscle stiffness      Subjective Assessment - 07/17/14 1025    Subjective "Lots of pain on the left today.  I walked three days in a row.  I'm really full and lots of pain."   Currently in Pain? Yes   Pain Score 5    Pain Location Flank   Pain Orientation Left   Pain Type Acute pain   Aggravating Factors  unsure; walking 3 days in a ros   Pain Relieving Factors motrin, therapy                         [facility] Adult PT Treatment/Exercise - 07/17/14 0001    Manual Therapy   Manual Lymphatic Drainage (MLD) In left sidelying, posterior interaxillary  anastomosis and right axillo-inguinal anastomosis; in supine, short neck, left axilla and anterior interaxillary anastomosis, right groin and axillo-inguinal anastomosis, area between right breast scars, directing toward pathways, and right upper arm.  In right sidelying, left periscapular area toward left groin.   Manual Therapy   Massage in right sidelying, to left upper back and neck   Myofascial Release At right axillary scar with soft tissue mobilization and myofascial release.                        [facility] Goals - 07/17/14 1406    CC Long Term Goal  #1   Status On-going   CC Long Term Goal  #2   Status On-going            Plan - 07/17/14 1404    Clinical Impression Statement Patient having more pain again today with difficulty getting comfortable; hopes that radiation treatment that she will start this afternoon will help this.  Does still feel that therapy provides partial relief of pain.   Pt will benefit from skilled therapeutic intervention in order to improve on the following deficits Increased edema;Pain;Increased fascial restricitons   Rehab Potential Good   Clinical Impairments Affecting Rehab Potential  active cancer   PT Frequency 2x / week   PT Duration 6 weeks   PT Treatment/Interventions Manual lymph drainage;Manual techniques;Scar mobilization   PT Next Visit Plan Manual lymph dainage, myofascial release, soft tissue work; reassess   Consulted and Agree with Plan of Care Patient        Problem List Patient Active Problem List   Diagnosis Date Noted  . 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 Chest wall pain 03/21/2014  . Abnormal LFTs (liver function tests) 09/12/2013  . Pleural effusion 08/18/2013  . 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 Breast cancer metastasized to multiple sites 08/18/2013    Maxemiliano Riel 07/17/2014, 2:07 PM  [place], [place], [address] Phone: [phone]   Fax:  [place], PT 07/17/2014 2:07 PM

## 2014-07-17 NOTE — Progress Notes (Addendum)
Weekly Management Note:  Site: Left chest (left mediastinum/left seventh rib) Current Dose:  250  cGy Projected Dose: 3250  cGy  Narrative: The patient is seen today for routine under treatment assessment. CBCT/MVCT images/port films were reviewed. The chart was reviewed.   Her set up today is satisfactory.  She complains of marked left chest discomfort this evening.  She is anxious to get home this evening.  Physical Examination: There were no vitals filed for this visit..  Weight:  .  She declines vital signs this evening.  No change.  Impression: Tolerating radiation therapy well.  Plan: Continue radiation therapy as planned.

## 2014-07-18 ENCOUNTER — Ambulatory Visit: Payer: Self-pay | Admitting: Thoracic Surgery (Cardiothoracic Vascular Surgery)

## 2014-07-18 ENCOUNTER — Ambulatory Visit: Payer: 59

## 2014-07-18 ENCOUNTER — Ambulatory Visit: Payer: Self-pay | Admitting: Oncology

## 2014-07-18 ENCOUNTER — Ambulatory Visit
Admission: RE | Admit: 2014-07-18 | Discharge: 2014-07-18 | Disposition: A | Discharge status: Home / Self Care | Payer: 59 | Source: Ambulatory Visit | Attending: Radiation Oncology | Admitting: Radiation Oncology

## 2014-07-18 DIAGNOSIS — C50211 Malignant neoplasm of upper-inner quadrant of right female breast: Secondary | ICD-10-CM | POA: Diagnosis not present

## 2014-07-19 ENCOUNTER — Ambulatory Visit: Payer: 59

## 2014-07-19 ENCOUNTER — Ambulatory Visit
Admission: RE | Admit: 2014-07-19 | Discharge: 2014-07-19 | Disposition: A | Discharge status: Home / Self Care | Payer: 59 | Source: Ambulatory Visit | Attending: Radiation Oncology | Admitting: Radiation Oncology

## 2014-07-19 DIAGNOSIS — C50211 Malignant neoplasm of upper-inner quadrant of right female breast: Secondary | ICD-10-CM | POA: Diagnosis not present

## 2014-07-20 ENCOUNTER — Ambulatory Visit
Admission: RE | Admit: 2014-07-20 | Discharge: 2014-07-20 | Disposition: A | Discharge status: Home / Self Care | Payer: 59 | Source: Ambulatory Visit | Attending: Radiation Oncology | Admitting: Radiation Oncology

## 2014-07-20 ENCOUNTER — Ambulatory Visit: Payer: 59

## 2014-07-20 ENCOUNTER — Other Ambulatory Visit: Payer: Self-pay | Admitting: *Deleted

## 2014-07-20 DIAGNOSIS — C50211 Malignant neoplasm of upper-inner quadrant of right female breast: Secondary | ICD-10-CM | POA: Diagnosis not present

## 2014-07-21 ENCOUNTER — Ambulatory Visit
Admission: RE | Admit: 2014-07-21 | Discharge: 2014-07-21 | Disposition: A | Discharge status: Home / Self Care | Payer: 59 | Source: Ambulatory Visit | Attending: Radiation Oncology | Admitting: Radiation Oncology

## 2014-07-21 ENCOUNTER — Ambulatory Visit: Payer: 59

## 2014-07-21 ENCOUNTER — Ambulatory Visit: Payer: 59 | Admitting: Physical Therapy

## 2014-07-21 DIAGNOSIS — I890 Lymphedema, not elsewhere classified: Secondary | ICD-10-CM | POA: Diagnosis not present

## 2014-07-21 DIAGNOSIS — M6289 Other specified disorders of muscle: Secondary | ICD-10-CM

## 2014-07-21 DIAGNOSIS — R0789 Other chest pain: Secondary | ICD-10-CM

## 2014-07-21 DIAGNOSIS — C50211 Malignant neoplasm of upper-inner quadrant of right female breast: Secondary | ICD-10-CM | POA: Diagnosis not present

## 2014-07-21 NOTE — Therapy (Signed)
[place], [place], [address] Phone: [phone]   Fax:  [phone]  Physical Therapy Treatment  Patient Details  Name: [name] [name] MRN: [id number] Date of Birth: [birth year] Referring Provider:  Consuela Mimes, MD  Encounter Date: 07/21/2014      PT End of Session - 07/21/14 1237    Visit Number 33   Number of Visits 47   Date for PT Re-Evaluation 09/08/14   PT Start Time 1024   PT Stop Time 1108   PT Time Calculation (min) 44 min   Activity Tolerance Patient tolerated treatment well   Behavior During Therapy [facility] for tasks assessed/performed      Past Medical History  Diagnosis Date  . Seizures 2010    Isolated incident.  . Breast cancer dx'd 2005/2011  . PONV (postoperative nausea and vomiting)   . Peripheral vascular disease 02/2010    blood clot related to porta cath    Past Surgical History  Procedure Laterality Date  . Breast lumpectomy  2005  . Axillary lymph node dissection  Dec. 2011  . Portacath placement  12/11  . Removal portacath    . Mediastinotomy chamberlain mcneil Left 06/02/2013    Procedure: MEDIASTINOTOMY CHAMBERLAIN MCNEIL;  Surgeon: [name], MD;  Location: [place];  Service: Thoracic;  Laterality: Left;  LEFT ANTERIOR MEDIASTINOTOMY     There were no vitals filed for this visit.  Visit Diagnosis:  Lymphedema  Other chest pain  Muscle stiffness      Subjective Assessment - 07/21/14 1027    Subjective Not feeling well.  Has been tired with the radiation treatment this week, but having difficulty falling asleep at night.   Currently in Pain? Yes   Pain Score 4   3 at right axilla   Pain Location Flank   Pain Orientation Left   Aggravating Factors  arms overhead for radiation   Pain Relieving Factors motrin                         [facility] Adult PT Treatment/Exercise - 07/21/14 0001    Manual Therapy   Manual  Lymphatic Drainage (MLD) In left sidelying, posterior interaxillary anastomosis and right axillo-inguinal anastomosis; in supine, short neck, left axilla and anterior interaxillary anastomosis, right groin and axillo-inguinal anastomosis, area between right breast scars, directing toward pathways, and right upper arm.  In right sidelying, left periscapular area toward left groin.   Manual Therapy Other (comment)   Manual Therapy   Myofascial Release At right axillary scar with soft tissue mobilization and myofascial release.   Other Manual Therapy In right sidelying, soft tissue work to left upper back and neck for tissue relaxation and pain relief                        [facility] Goals - 07/17/14 1406    CC Long Term Goal  #1   Status On-going   CC Long Term Goal  #2   Status On-going            Plan - 07/21/14 1238    Clinical Impression Statement Now reports not feeling well with increased fatigue and some nausea since starting radiation at the beginning of this week; still reports benefit of therapy session for pain relief.   Pt will benefit from skilled therapeutic intervention in order to improve on the following deficits Increased edema;Pain;Increased fascial restricitons  Rehab Potential Good   PT Treatment/Interventions Manual lymph drainage;Manual techniques;Scar mobilization   PT Next Visit Plan Manual lymph dainage, myofascial release, soft tissue work; reassess        Problem List Patient Active Problem List   Diagnosis Date Noted  . 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 Chest wall pain 03/21/2014  . Abnormal LFTs (liver function tests) 09/12/2013  . Pleural effusion 08/18/2013  . 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 Breast cancer metastasized to multiple sites 08/18/2013    [facility] 07/21/2014, 12:41 PM  [place], [place], [address] Phone: [phone]   Fax:  [place], PT 07/21/2014 12:41 PM

## 2014-07-24 ENCOUNTER — Encounter: Payer: Self-pay | Admitting: Radiation Oncology

## 2014-07-24 ENCOUNTER — Ambulatory Visit: Payer: 59 | Admitting: Physical Therapy

## 2014-07-24 ENCOUNTER — Ambulatory Visit: Payer: 59

## 2014-07-24 ENCOUNTER — Ambulatory Visit
Admission: RE | Admit: 2014-07-24 | Discharge: 2014-07-24 | Disposition: A | Discharge status: Home / Self Care | Payer: 59 | Source: Ambulatory Visit | Attending: Radiation Oncology | Admitting: Radiation Oncology

## 2014-07-24 ENCOUNTER — Other Ambulatory Visit: Payer: Self-pay

## 2014-07-24 VITALS — BP 130/84 | HR 88 | Temp 98.0°F

## 2014-07-24 DIAGNOSIS — C50912 Malignant neoplasm of unspecified site of left female breast: Secondary | ICD-10-CM

## 2014-07-24 DIAGNOSIS — C50211 Malignant neoplasm of upper-inner quadrant of right female breast: Secondary | ICD-10-CM | POA: Diagnosis not present

## 2014-07-24 DIAGNOSIS — I890 Lymphedema, not elsewhere classified: Secondary | ICD-10-CM | POA: Diagnosis not present

## 2014-07-24 DIAGNOSIS — R0789 Other chest pain: Secondary | ICD-10-CM

## 2014-07-24 DIAGNOSIS — M6289 Other specified disorders of muscle: Secondary | ICD-10-CM

## 2014-07-24 NOTE — Progress Notes (Signed)
Weekly Management Note:  Site: Left chest including left seventh rib Current Dose:  1500  cGy Projected Dose: 3250  cGy  Narrative: The patient is seen today for routine under treatment assessment. CBCT/MVCT images/port films were reviewed. The chart was reviewed.   She is without new complaints today.  She still has severe pain along her left lower back and left ribs.  She does have fatigue and had nausea earlier today.  Physical Examination:  Filed Vitals:   07/24/14 1707  BP: 130/84  Pulse: 88  Temp: 98 F (36.7 C)  .  Weight:  .  No significant skin changes.  Impression: Tolerating radiation therapy well.  Plan: Continue radiation therapy as planned.

## 2014-07-24 NOTE — Therapy (Signed)
[place], [place], [address] Phone: [phone]   Fax:  [phone]  Physical Therapy Treatment  Patient Details  Name: [name] [name] MRN: [id number] Date of Birth: 03/13/1961 Referring Provider:  Consuela Mimes, MD  Encounter Date: 07/24/2014      PT End of Session - 07/24/14 1246    Visit Number 34   Number of Visits 47   Date for PT Re-Evaluation 09/08/14   PT Start Time 9937   PT Stop Time 1104   PT Time Calculation (min) 41 min   Activity Tolerance Patient tolerated treatment well   Behavior During Therapy [facility] for tasks assessed/performed      Past Medical History  Diagnosis Date  . Seizures 2010    Isolated incident.  . Breast cancer dx'd 2005/2011  . PONV (postoperative nausea and vomiting)   . Peripheral vascular disease 02/2010    blood clot related to porta cath    Past Surgical History  Procedure Laterality Date  . Breast lumpectomy  2005  . Axillary lymph node dissection  Dec. 2011  . Portacath placement  12/11  . Removal portacath    . Mediastinotomy chamberlain mcneil Left 06/02/2013    Procedure: MEDIASTINOTOMY CHAMBERLAIN MCNEIL;  Surgeon: [name], MD;  Location: [place];  Service: Thoracic;  Laterality: Left;  LEFT ANTERIOR MEDIASTINOTOMY     There were no vitals filed for this visit.  Visit Diagnosis:  Lymphedema  Other chest pain  Muscle stiffness      Subjective Assessment - 07/24/14 1029    Subjective Having more pain on left side.  Mother-in-law fell over the weekend, hit her head and broke her hip.  Is in the hospital.   Currently in Pain? Yes   Pain Score 5   and 3 at right axilla   Pain Location Flank   Pain Orientation Left   Aggravating Factors  need to be drained   Pain Relieving Factors motrin                         [facility] Adult PT Treatment/Exercise - 07/24/14 0001    Manual Therapy   Manual Lymphatic  Drainage (MLD) In left sidelying, posterior interaxillary anastomosis and right axillo-inguinal anastomosis; in supine, short neck, left axilla and anterior interaxillary anastomosis, right groin and axillo-inguinal anastomosis, area between right breast scars, directing toward pathways, and right upper arm.  In right sidelying, left periscapular area toward left groin.   Manual Therapy Other (comment)  In right sidelying, soft tissue work to left upper back and    Manual Therapy   Myofascial Release At right axillary scar with soft tissue mobilization and myofascial release.                        [facility] Goals - 07/17/14 1406    CC Long Term Goal  #1   Status On-going   CC Long Term Goal  #2   Status On-going            Plan - 07/24/14 1247    Clinical Impression Statement Doing okay today except for some continued increased left flank pain.   Pt will benefit from skilled therapeutic intervention in order to improve on the following deficits Increased edema;Pain;Increased fascial restricitons   Rehab Potential Good   PT Treatment/Interventions Manual lymph drainage;Manual techniques;Scar mobilization   PT Next Visit Plan Manual lymph dainage,  myofascial release, soft tissue work; reassess   Consulted and Agree with Plan of Care Patient        Problem List Patient Active Problem List   Diagnosis Date Noted  . 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 Chest wall pain 03/21/2014  . Abnormal LFTs (liver function tests) 09/12/2013  . Pleural effusion 08/18/2013  . 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 Breast cancer metastasized to multiple sites 08/18/2013    [facility] 07/24/2014, 12:48 PM  [place], [place], [address] Phone: [phone]   Fax:  [place], PT 07/24/2014 12:48 PM

## 2014-07-24 NOTE — Progress Notes (Signed)
Ms. [name] [name] has reported level 4/10 pain in her left lower back and left ribs.  Fatigued and nauseous as today

## 2014-07-25 ENCOUNTER — Ambulatory Visit
Admission: RE | Admit: 2014-07-25 | Discharge: 2014-07-25 | Disposition: A | Discharge status: Home / Self Care | Payer: 59 | Source: Ambulatory Visit | Attending: Radiation Oncology | Admitting: Radiation Oncology

## 2014-07-25 ENCOUNTER — Other Ambulatory Visit: Payer: Self-pay | Admitting: Nurse Practitioner

## 2014-07-25 ENCOUNTER — Ambulatory Visit: Payer: 59

## 2014-07-25 ENCOUNTER — Telehealth: Payer: Self-pay | Admitting: Oncology

## 2014-07-25 DIAGNOSIS — C50211 Malignant neoplasm of upper-inner quadrant of right female breast: Secondary | ICD-10-CM | POA: Diagnosis not present

## 2014-07-25 NOTE — Telephone Encounter (Signed)
Lab appointment adjust per pof and per pt request,also dr Jana Hakim appointment scheduled and patient will get in [place]

## 2014-07-26 ENCOUNTER — Ambulatory Visit: Payer: 59

## 2014-07-26 ENCOUNTER — Ambulatory Visit
Admission: RE | Admit: 2014-07-26 | Discharge: 2014-07-26 | Disposition: A | Discharge status: Home / Self Care | Payer: 59 | Source: Ambulatory Visit | Attending: Radiation Oncology | Admitting: Radiation Oncology

## 2014-07-26 DIAGNOSIS — C50211 Malignant neoplasm of upper-inner quadrant of right female breast: Secondary | ICD-10-CM | POA: Diagnosis not present

## 2014-07-27 ENCOUNTER — Ambulatory Visit
Admission: RE | Admit: 2014-07-27 | Discharge: 2014-07-27 | Disposition: A | Discharge status: Home / Self Care | Payer: 59 | Source: Ambulatory Visit | Attending: Radiation Oncology | Admitting: Radiation Oncology

## 2014-07-27 ENCOUNTER — Ambulatory Visit: Payer: 59

## 2014-07-27 DIAGNOSIS — C50211 Malignant neoplasm of upper-inner quadrant of right female breast: Secondary | ICD-10-CM | POA: Diagnosis not present

## 2014-07-28 ENCOUNTER — Ambulatory Visit: Payer: 59 | Admitting: Physical Therapy

## 2014-07-28 ENCOUNTER — Ambulatory Visit: Payer: 59

## 2014-07-28 ENCOUNTER — Ambulatory Visit
Admission: RE | Admit: 2014-07-28 | Discharge: 2014-07-28 | Disposition: A | Discharge status: Home / Self Care | Payer: 59 | Source: Ambulatory Visit | Attending: Radiation Oncology | Admitting: Radiation Oncology

## 2014-07-28 DIAGNOSIS — I890 Lymphedema, not elsewhere classified: Secondary | ICD-10-CM | POA: Diagnosis not present

## 2014-07-28 DIAGNOSIS — C50211 Malignant neoplasm of upper-inner quadrant of right female breast: Secondary | ICD-10-CM | POA: Diagnosis not present

## 2014-07-28 DIAGNOSIS — M6289 Other specified disorders of muscle: Secondary | ICD-10-CM

## 2014-07-28 DIAGNOSIS — R0789 Other chest pain: Secondary | ICD-10-CM

## 2014-07-28 NOTE — Therapy (Signed)
[place], [place], [address] Phone: [phone]   Fax:  [phone]  Physical Therapy Treatment  Patient Details  Name: [name] [name] MRN: [id number] Date of Birth: 1961-01-24 Referring Provider:  Consuela Mimes, MD  Encounter Date: 07/28/2014      PT End of Session - 07/28/14 1201    Visit Number 35   Number of Visits 47   Date for PT Re-Evaluation 09/08/14   PT Start Time 1024   PT Stop Time 1110   PT Time Calculation (min) 46 min   Activity Tolerance Patient tolerated treatment well   Behavior During Therapy [facility] for tasks assessed/performed      Past Medical History  Diagnosis Date  . Seizures 2010    Isolated incident.  . Breast cancer dx'd 2005/2011  . PONV (postoperative nausea and vomiting)   . Peripheral vascular disease 02/2010    blood clot related to porta cath    Past Surgical History  Procedure Laterality Date  . Breast lumpectomy  2005  . Axillary lymph node dissection  Dec. 2011  . Portacath placement  12/11  . Removal portacath    . Mediastinotomy chamberlain mcneil Left 06/02/2013    Procedure: MEDIASTINOTOMY CHAMBERLAIN MCNEIL;  Surgeon: [name], MD;  Location: [place];  Service: Thoracic;  Laterality: Left;  LEFT ANTERIOR MEDIASTINOTOMY     There were no vitals filed for this visit.  Visit Diagnosis:  Lymphedema  Other chest pain  Muscle stiffness      Subjective Assessment - 07/28/14 1025    Subjective Not feeling well.  More pain, nausea.   Currently in Pain? Yes   Pain Score 5    Pain Location Flank   Pain Orientation Left   Pain Relieving Factors motrin                         [facility] Adult PT Treatment/Exercise - 07/28/14 0001    Manual Therapy   Manual Lymphatic Drainage (MLD) In left sidelying, posterior interaxillary anastomosis and right axillo-inguinal anastomosis; in supine, short neck, left axilla and  anterior interaxillary anastomosis, right groin and axillo-inguinal anastomosis, area between right breast scars, directing toward pathways, and right upper arm.  In right sidelying, left periscapular area toward left groin.   Manual Therapy Other (comment)  In right sidelying, soft tissue work to left upper back and    Manual Therapy   Myofascial Release At right axillary scar with soft tissue mobilization and myofascial release.                        [facility] Goals - 07/17/14 1406    CC Long Term Goal  #1   Status On-going   CC Long Term Goal  #2   Status On-going            Plan - 07/28/14 1201    Clinical Impression Statement Having a rough week.   Pt will benefit from skilled therapeutic intervention in order to improve on the following deficits Increased edema;Pain;Increased fascial restricitons   Rehab Potential Good   PT Duration 6 weeks   PT Treatment/Interventions Manual lymph drainage;Manual techniques;Scar mobilization   PT Next Visit Plan Manual lymph dainage, myofascial release, soft tissue work; reassess   Consulted and Agree with Plan of Care Patient        Problem List Patient Active Problem List   Diagnosis Date  Noted  . 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 Chest wall pain 03/21/2014  . Abnormal LFTs (liver function tests) 09/12/2013  . Pleural effusion 08/18/2013  . 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 Breast cancer metastasized to multiple sites 08/18/2013    [facility] 07/28/2014, 12:03 PM  [place], [place], [address] Phone: [phone]   Fax:  [place], PT 07/28/2014 12:03 PM

## 2014-07-31 ENCOUNTER — Ambulatory Visit: Payer: 59

## 2014-07-31 ENCOUNTER — Other Ambulatory Visit: Payer: Self-pay

## 2014-07-31 ENCOUNTER — Ambulatory Visit
Admission: RE | Admit: 2014-07-31 | Discharge: 2014-07-31 | Disposition: A | Discharge status: Home / Self Care | Payer: 59 | Source: Ambulatory Visit | Attending: Radiation Oncology | Admitting: Radiation Oncology

## 2014-07-31 ENCOUNTER — Ambulatory Visit: Payer: 59 | Attending: Oncology | Admitting: Physical Therapy

## 2014-07-31 DIAGNOSIS — I890 Lymphedema, not elsewhere classified: Secondary | ICD-10-CM | POA: Diagnosis not present

## 2014-07-31 DIAGNOSIS — C50912 Malignant neoplasm of unspecified site of left female breast: Secondary | ICD-10-CM

## 2014-07-31 DIAGNOSIS — M6289 Other specified disorders of muscle: Secondary | ICD-10-CM

## 2014-07-31 DIAGNOSIS — R0789 Other chest pain: Secondary | ICD-10-CM

## 2014-07-31 DIAGNOSIS — C50211 Malignant neoplasm of upper-inner quadrant of right female breast: Secondary | ICD-10-CM | POA: Diagnosis not present

## 2014-07-31 NOTE — Therapy (Signed)
[place], [place], [address] Phone: [phone]   Fax:  [phone]  Physical Therapy Treatment  Patient Details  Name: [name] [name] MRN: [id number] Date of Birth: [birth year] Referring Provider:  Consuela Mimes, MD  Encounter Date: 07/31/2014      PT End of Session - 07/31/14 1453    Visit Number 36   Number of Visits 47   Date for PT Re-Evaluation 09/08/14   PT Start Time 0938   PT Stop Time 1018   PT Time Calculation (min) 40 min   Activity Tolerance Patient tolerated treatment well   Behavior During Therapy [facility] for tasks assessed/performed      Past Medical History  Diagnosis Date  . Seizures 2010    Isolated incident.  . Breast cancer dx'd 2005/2011  . PONV (postoperative nausea and vomiting)   . Peripheral vascular disease 02/2010    blood clot related to porta cath    Past Surgical History  Procedure Laterality Date  . Breast lumpectomy  2005  . Axillary lymph node dissection  Dec. 2011  . Portacath placement  12/11  . Removal portacath    . Mediastinotomy chamberlain mcneil Left 06/02/2013    Procedure: MEDIASTINOTOMY CHAMBERLAIN MCNEIL;  Surgeon: [name], MD;  Location: [place];  Service: Thoracic;  Laterality: Left;  LEFT ANTERIOR MEDIASTINOTOMY     There were no vitals filed for this visit.  Visit Diagnosis:  Lymphedema  Other chest pain  Muscle stiffness      Subjective Assessment - 07/31/14 0939    Subjective Having a sore throat.  "I looked it up.  It's a side effect of radiation."  Was nauseous this morning.   Currently in Pain? Yes   Pain Score 6    Pain Location Flank   Pain Orientation Left   Pain Descriptors / Indicators Other (Comment)  "tight as a drum"                         [facility] Adult PT Treatment/Exercise - 07/31/14 0001    Manual Therapy   Manual Lymphatic Drainage (MLD) In left sidelying, posterior  interaxillary anastomosis and right axillo-inguinal anastomosis; in supine, short neck, left axilla and anterior interaxillary anastomosis, right groin and axillo-inguinal anastomosis, area between right breast scars, directing toward pathways, and neck at lateral, anterolateral, and midline.  In right sidelying, left periscapular area toward left groin.    Manual Therapy Other (comment)  In right sidelying, soft tissue work to left upper back and    Manual Therapy   Myofascial Release At right axillary scar with soft tissue mobilization and myofascial release.                        [facility] Goals - 07/17/14 1406    CC Long Term Goal  #1   Status On-going   CC Long Term Goal  #2   Status On-going            Plan - 07/31/14 1454    Clinical Impression Statement Patient with new c/o sore throat that she found online might be a side effect of radiation treatment to chest, so manual lymph drainage included neck today to see if that would make her more comfortable.  Just after session, patient felt it had helped.     Pt will benefit from skilled therapeutic intervention in order to improve on the  following deficits Increased edema;Pain;Increased fascial restricitons   PT Treatment/Interventions Manual lymph drainage;Manual techniques;Scar mobilization   PT Next Visit Plan Manual lymph dainage, myofascial release, soft tissue work; reassess.  Continue neck manual lymph drainage if patient felt benefit today.        Problem List Patient Active Problem List   Diagnosis Date Noted  . 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 Chest wall pain 03/21/2014  . Abnormal LFTs (liver function tests) 09/12/2013  . Pleural effusion 08/18/2013  . 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 Breast cancer metastasized to multiple sites 08/18/2013    [name],[name] [name] 07/31/2014, 2:56 PM  [place], [place], [address] Phone: [phone]   Fax:  [place], PT 07/31/2014 2:57 PM

## 2014-07-31 NOTE — Progress Notes (Addendum)
She rates her pain as a 3 on a scale of 0-10, "unsure" on description of pain, constant with variation in level.  Pt complains of, Fatigue and Poor Appetite. Nausea, Reflux and Diarrhea approximately 2. Suggested Imodium pt reports "no" it doesn't work. Reports painful swallowing "choking" feeling more on the the left than right.  Has difficulty with solid.  Eats a soft diet.  Skin slight erythema - neck/chest Reports pleural drain doing well without complications. Denies wheezing or shortness of breath.  Pt refused having weight or vital signs taken today.  She said she had them done earlier in the day and doesn't want them done again.  She says she is frustrated with our triage protocol.

## 2014-07-31 NOTE — Progress Notes (Signed)
Weekly Management Note:  Site: Left mediastinum/left rib Current Dose:  2750  cGy Projected Dose: 3250  cGy  Narrative: The patient is seen today for routine under treatment assessment. CBCT/MVCT images/port films were reviewed. The chart was reviewed.   She is without new complaints today.  Her discomfort/pain is essentially unchanged.  Her pleural drain is functioning well.  No change in dyspnea.  Her treatment plan, and conebeam CT from today were reviewed with the patient and her husband.  Physical Examination:  Filed Vitals:  .  Weight:  .  There is slight erythema along the left anterior chest.  Impression: Tolerating radiation therapy well.  She will finish radiation therapy this Wednesday.  Plan: Continue radiation therapy as planned.  One-month follow-up after completion of radiation therapy.

## 2014-08-01 ENCOUNTER — Ambulatory Visit
Admission: RE | Admit: 2014-08-01 | Discharge: 2014-08-01 | Disposition: A | Discharge status: Home / Self Care | Payer: 59 | Source: Ambulatory Visit | Attending: Radiation Oncology | Admitting: Radiation Oncology

## 2014-08-01 ENCOUNTER — Ambulatory Visit: Payer: 59

## 2014-08-01 DIAGNOSIS — C50211 Malignant neoplasm of upper-inner quadrant of right female breast: Secondary | ICD-10-CM | POA: Diagnosis not present

## 2014-08-01 NOTE — Addendum Note (Signed)
Encounter addended by: Jenene Slicker, RN on: 08/01/2014  8:36 AM<BR>     Documentation filed: Notes Section

## 2014-08-02 ENCOUNTER — Ambulatory Visit: Payer: 59

## 2014-08-02 ENCOUNTER — Ambulatory Visit
Admission: RE | Admit: 2014-08-02 | Discharge: 2014-08-02 | Disposition: A | Discharge status: Home / Self Care | Payer: 59 | Source: Ambulatory Visit | Attending: Radiation Oncology | Admitting: Radiation Oncology

## 2014-08-02 DIAGNOSIS — C50211 Malignant neoplasm of upper-inner quadrant of right female breast: Secondary | ICD-10-CM | POA: Diagnosis not present

## 2014-08-03 ENCOUNTER — Encounter: Payer: Self-pay | Admitting: Radiation Oncology

## 2014-08-03 ENCOUNTER — Telehealth: Payer: Self-pay

## 2014-08-03 ENCOUNTER — Ambulatory Visit: Payer: 59

## 2014-08-03 NOTE — Telephone Encounter (Signed)
Called patient to answer questions regarding recovery from side effects of radiation.No answer.Left message for her to call [phone] and ask for Val.

## 2014-08-03 NOTE — Progress Notes (Signed)
[facility] End of Treatment Note  Name:[name] [name]  Date: 08/03/2014 TDH:[id number] DOB:1961/03/15   Status:outpatient    CC: Pcp Not In System  Dr. Gunnar Bulla Magrinat  REFERRING PHYSICIAN:  Dr. Gunnar Bulla Magrinat   DIAGNOSIS:  Stage IV (T1 N2 M1) metastatic adenocarcinoma the right breast to the left chest/upper abdomen.  INDICATION FOR TREATMENT: Palliative   TREATMENT DATES: 07/17/2014 through 08/02/2014                          SITE/DOSE: Left mediastinum, left seventh rib 3250 cGy in 13 sessions                           BEAMS/ENERGY: RAO and LPO  directed beams with mixed 10 MV and 15 MV photons.  She was set up daily with cone beam CT image guidance.                 NARRATIVE: The patient tolerated treatment well without significant toxicity by completion of therapy.  I believe that I could see slight regression of her disease along the left anterior parasternal region during her last week of therapy.  I explained her that it may be 2-3 months before we see maximum regression from her radiation therapy.                        PLAN: Routine followup in one month. Patient instructed to call if questions or worsening complaints in interim.

## 2014-08-04 ENCOUNTER — Ambulatory Visit (HOSPITAL_BASED_OUTPATIENT_CLINIC_OR_DEPARTMENT_OTHER): Payer: 59 | Admitting: Oncology

## 2014-08-04 ENCOUNTER — Telehealth: Payer: Self-pay | Admitting: Radiation Oncology

## 2014-08-04 ENCOUNTER — Ambulatory Visit: Payer: 59 | Admitting: Physical Therapy

## 2014-08-04 ENCOUNTER — Other Ambulatory Visit: Payer: Self-pay | Admitting: Radiation Oncology

## 2014-08-04 ENCOUNTER — Telehealth: Payer: Self-pay | Admitting: Oncology

## 2014-08-04 VITALS — Wt 173.7 lb

## 2014-08-04 DIAGNOSIS — I890 Lymphedema, not elsewhere classified: Secondary | ICD-10-CM | POA: Diagnosis not present

## 2014-08-04 DIAGNOSIS — R7989 Other specified abnormal findings of blood chemistry: Secondary | ICD-10-CM | POA: Diagnosis not present

## 2014-08-04 DIAGNOSIS — C773 Secondary and unspecified malignant neoplasm of axilla and upper limb lymph nodes: Secondary | ICD-10-CM | POA: Diagnosis not present

## 2014-08-04 DIAGNOSIS — Z86718 Personal history of other venous thrombosis and embolism: Secondary | ICD-10-CM

## 2014-08-04 DIAGNOSIS — C50412 Malignant neoplasm of upper-outer quadrant of left female breast: Secondary | ICD-10-CM | POA: Diagnosis not present

## 2014-08-04 DIAGNOSIS — C50919 Malignant neoplasm of unspecified site of unspecified female breast: Secondary | ICD-10-CM

## 2014-08-04 DIAGNOSIS — C50912 Malignant neoplasm of unspecified site of left female breast: Secondary | ICD-10-CM

## 2014-08-04 DIAGNOSIS — R945 Abnormal results of liver function studies: Secondary | ICD-10-CM

## 2014-08-04 DIAGNOSIS — M8580 Other specified disorders of bone density and structure, unspecified site: Secondary | ICD-10-CM

## 2014-08-04 DIAGNOSIS — M6289 Other specified disorders of muscle: Secondary | ICD-10-CM

## 2014-08-04 DIAGNOSIS — R0789 Other chest pain: Secondary | ICD-10-CM

## 2014-08-04 DIAGNOSIS — J90 Pleural effusion, not elsewhere classified: Secondary | ICD-10-CM

## 2014-08-04 DIAGNOSIS — C50211 Malignant neoplasm of upper-inner quadrant of right female breast: Secondary | ICD-10-CM

## 2014-08-04 MED ORDER — SUCRALFATE 1 G PO TABS: ORAL_TABLET | ORAL | Status: DC

## 2014-08-04 MED ORDER — LIDOCAINE VISCOUS 2 % MT SOLN: OROMUCOSAL | Status: DC

## 2014-08-04 NOTE — Progress Notes (Signed)
[name] [name]  [phone] Fax:([phone]) [phone]     ID: [name] [name] OB: Jul 21, 1960  MR#: [id number]  BJS#:[id number]  PCP: Pcp Not In System GYN:  [name] [name] SU:  OTHER MD: [name] [name], [place], [name] [name], [name] [name], [name] [name]  CHIEF COMPLAINT: Stage IV breast cancer  CURRENT TREATMENT: exemestane  BREAST CANCER HISTORY: From doctor [name] [name]'s intake note 03/20/2004:  "The patient is a very pleasant 54 year old female, without significant past medical history.  Her family history is significant for a sister who at age 40 was diagnosed with invasive ductal carcinoma.  She is a breast cancer survivor at age 81 now.  The patient states that she has never really had a screening mammogram until October 2005, when she felt that it was time for her to start having mammograms done on a yearly basis.  Therefore, on 01/26/04, she underwent a screening mammogram and an abnormality was detected in the upper outer right breast.  She, therefore, underwent spot compression views of both the right and the left breast.  The left breast revealed a well-defined mass in the upper outer left quadrant, present at the 2 o'clock position, measuring 1.8 cm, 6 cm from the nipple.  This, by ultrasound, was felt to be a simple cyst measuring 1.8 cm.  On the right breast, a spiculated mass was noted in the upper outer right quadrant.  The ultrasound revealed a shadowing irregular solid mass at the 10:30 position, 9 cm from the nipple, measuring 1.2 cm in greatest dimension, correlating with the spiculated mass seen on the mammogram.  The right axilla was negative ultrasonically.  Because of this, the patient underwent a needle biopsy of the right breast and the biopsy was positive invasive mammary carcinoma that showed features consistent with a high-grade invasive ductal carcinoma associated with desmoplastic stroma.  No in situ  component was seen and no definite lymphovascular invasion was identified.  On the core biopsy, the tumor measured about 0.8 cm.  Because of this, she was seen by Dr. [name] and the patient was taken to the [facility] on March 15, 2004.  She underwent a right breast lumpectomy with sentinel node biopsy.  The final pathology revealed an invasive ductal carcinoma, measuring 1.7 cm, grade 2 of 3.  Margins were free of tumor.  Atypical lobular hyperplasia was noted.  One sentinel node was removed which was negative for metastatic disease.  The tumor was staged at T1c, N0 MX.  It was estrogen receptor positive, progesterone receptor positive.  HER-2/neu was 2+.  FISH was negative.  All margins were free of tumor.  She is now seen in Medical Oncology for further evaluation and management of this newly diagnosed T1c, node negative, stage I, invasive ductal carcinoma of the right breast."  Her subsequent history is as detailed below  INTERVAL HISTORY: [name] [name] returns today for followup of her stage IV breast cancer accompanied by her husband [name] [name]. Since her last visit here she underwent further evaluation and a brief course of radiation to the mediastinum and left seventh rib under the care of [name] [name]. She tolerated the treatment well. Clinically she has not noticed any change. 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 Also she understood [name] [name] to say that she was at risk of collapse of  her airways to the left lung because of the tumor, in which case she would have a "drowned lung". She wanted to know what she was never told about this before. She feels things are being "Kept from her". She doesn't want anymore surprises. Otherwise she continues to receive treatment for lymphedema, is doing her best to keep her self well-nourished and hydrated him a and  tries to be as active as she can. A detailed review of systems was otherwise stable  PAST MEDICAL HISTORY: Past Medical History  Diagnosis Date  . Seizures 2010    Isolated incident.  . Breast cancer dx'd 2005/2011  . PONV (postoperative nausea and vomiting)   . Peripheral vascular disease 02/2010    blood clot related to porta cath    PAST SURGICAL HISTORY: Past Surgical History  Procedure Laterality Date  . Breast lumpectomy  2005  . Axillary lymph node dissection  Dec. 2011  . Portacath placement  12/11  . Removal portacath    . Mediastinotomy chamberlain mcneil Left 06/02/2013    Procedure: MEDIASTINOTOMY CHAMBERLAIN MCNEIL;  Surgeon: [name] Nakayama, MD;  Location: [facility] OR;  Service: Thoracic;  Laterality: Left;  LEFT ANTERIOR MEDIASTINOTOMY     FAMILY HISTORY Family History  Problem Relation Age of Onset  . COPD Mother   . Breast cancer Sister 59   The patient's father is living, 5 years old as of may 2015. He lives in [place]. The patient's mother died from complications of COPD at the age of 19. These has 2 brothers, one sister. Her sister developed breast cancer at the age of 24. She is doing well. The patient herself underwent genetic testing at [facility] in 2011 and was found to be BRCA negative  GYNECOLOGIC HISTORY:  Menarche age 24, she is GX P0. She stopped having periods with her initial chemotherapy in 2006.  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 Her husband [name] [name] is a Medical illustrator in [place]. He has a child from a prior marriage. At home they have 2 rescue dogs, Hobo and [place]. The patient is religious but not a church attender    ADVANCED DIRECTIVES: In place; at the 08/04/2014 visit in particular the patient was very clear, with her husband present, that she would not want any kind of feeding tubes or "other tubes" if her condition deteriorated.   HEALTH  MAINTENANCE: History  Substance Use Topics  . Smoking status: Never Smoker   . Smokeless tobacco: Never Used  . Alcohol Use: No     Colonoscopy:  PAP:  Bone density: March 2015; mild osteopenia  Lipid panel:  Allergies  Allergen Reactions  . Decadron [Dexamethasone] Other (See Comments)    Patient does not tolerate steroids.   . Enoxaparin Other (See Comments)    unknown  . Fluconazole Swelling  . Hydromorphone Hcl Nausea And Vomiting  . Morphine And Related   . Tegaderm Ag Mesh [Silver]     Current Outpatient Prescriptions  Medication Sig Dispense Refill  . ALPRAZolam (XANAX) 0.5 MG tablet Take 1 tablet (0.5 mg total) by mouth 2 (two) times daily as needed for anxiety. 30 tablet 0  . b complex vitamins capsule Take 1 capsule by mouth daily.    . Calcium-Magnesium-Vitamin D (CITRACAL CALCIUM+D PO) Take by mouth. 432m vitamin C, 50104mvitamin D3 2 PO DAILY    . cholecalciferol (VITAMIN D) 1000 UNITS tablet Take 1 tablet (  1,000 Units total) by mouth daily.    Marland Kitchen exemestane (AROMASIN) 25 MG tablet Take 25 mg by mouth daily after breakfast.   4  . folic acid (FOLVITE) 1 MG tablet Take 1 mg by mouth daily.    Marland Kitchen ibuprofen (ADVIL,MOTRIN) 200 MG tablet Take 200 mg by mouth every 4 (four) hours as needed.    . lidocaine (XYLOCAINE) 2 % solution Patient: Mix 1part 2% viscous lidocaine, 1part H20. Swallow 23m of this mixture, 368m before meals and at bedtime, up to QID. 100 mL 2  . Melatonin 1 MG TABS Take 3 mg by mouth at bedtime as needed (sleep).     . palbociclib (IBRANCE) 75 MG capsule Take 1 capsule (75 mg total) by mouth daily with breakfast. Take whole with food. 11 capsule 4  . sucralfate (CARAFATE) 1 G tablet Dissolve 1 tablet in 10 mL H20 and swallow up to QID 60 tablet 2  . [DISCONTINUED] metoCLOPramide (REGLAN) 10 MG tablet Take 1 tablet (10 mg total) by mouth 4 (four) times daily -  before meals and at bedtime. 60 tablet 3   No current facility-administered medications  for this visit.    OBJECTIVE: Middle-aged white woman who refused vitals and weights today There were no vitals filed for this visit.   There is no weight on file to calculate BMI.   There were no vitals filed for this visit.    ECOG FS:1 - Symptomatic but completely ambulatory  Sclerae unicteric, EOMs intact Oropharynx clear and moist No cervical or supraclavicular adenopathy Lungs no rales or rhonchi on the right, obvious rub mid lung on the left Heart regular rate and rhythm Abd soft, nontender, positive bowel sounds MSK no focal spinal tenderness Neuro: nonfocal, well oriented, stressed affect Breasts: Deferred   LAB RESULTS:   CMP     Component Value Date/Time   NA 144 06/29/2014 1615   NA 134* 09/03/2013 2145   K 4.4 06/29/2014 1615   K 3.7 09/03/2013 2145   CL 97 09/03/2013 2145   CL 105 05/06/2012 1333   CO2 25 06/29/2014 1615   CO2 23 09/03/2013 2145   GLUCOSE 80 06/29/2014 1615   GLUCOSE 121* 09/03/2013 2145   GLUCOSE 124* 05/06/2012 1333   BUN 14.2 06/29/2014 1615   BUN 8 09/03/2013 2145   CREATININE 0.8 06/29/2014 1615   CREATININE 0.64 09/03/2013 2145   CALCIUM 10.5* 06/29/2014 1615   CALCIUM 9.0 09/03/2013 2145   PROT 7.5 06/29/2014 1615   PROT 6.8 09/03/2013 2145   ALBUMIN 4.1 06/29/2014 1615   ALBUMIN 3.3* 09/03/2013 2145   AST 32 06/29/2014 1615   AST 55* 09/03/2013 2145   ALT 58* 06/29/2014 1615   ALT 85* 09/03/2013 2145   ALKPHOS 74 06/29/2014 1615   ALKPHOS 114 09/03/2013 2145   BILITOT 0.39 06/29/2014 1615   BILITOT 0.3 09/03/2013 2145   GFRNONAA >90 09/03/2013 2145   GFRAA >90 09/03/2013 2145    No results found for: SPEP  Lab Results  Component Value Date   WBC 7.4 06/29/2014   NEUTROABS 5.1 06/29/2014   HGB 14.5 06/29/2014   HCT 42.2 06/29/2014   MCV 94.6 06/29/2014   PLT 298 06/29/2014      Chemistry      Component Value Date/Time   NA 144 06/29/2014 1615   NA 134* 09/03/2013 2145   K 4.4 06/29/2014 1615   K 3.7  09/03/2013 2145   CL 97 09/03/2013 2145   CL 105 05/06/2012  1333   CO2 25 06/29/2014 1615   CO2 23 09/03/2013 2145   BUN 14.2 06/29/2014 1615   BUN 8 09/03/2013 2145   CREATININE 0.8 06/29/2014 1615   CREATININE 0.64 09/03/2013 2145      Component Value Date/Time   CALCIUM 10.5* 06/29/2014 1615   CALCIUM 9.0 09/03/2013 2145   ALKPHOS 74 06/29/2014 1615   ALKPHOS 114 09/03/2013 2145   AST 32 06/29/2014 1615   AST 55* 09/03/2013 2145   ALT 58* 06/29/2014 1615   ALT 85* 09/03/2013 2145   BILITOT 0.39 06/29/2014 1615   BILITOT 0.3 09/03/2013 2145       Lab Results  Component Value Date   LABCA2 25 11/02/2007    No components found for: [id number]  No results for input(s): INR in the last 168 hours.  Urinalysis    Component Value Date/Time   COLORURINE YELLOW 09/03/2013 2109   APPEARANCEUR CLEAR 09/03/2013 2109   LABSPEC 1.005 09/12/2013 1542   LABSPEC 1.016 09/03/2013 2109   PHURINE 6.0 09/12/2013 1542   PHURINE 6.0 09/03/2013 2109   GLUCOSEU Negative 09/12/2013 [place] 09/03/2013 2109   HGBUR Negative 09/12/2013 [place] 09/03/2013 2109   BILIRUBINUR Negative 09/12/2013 [place] 09/03/2013 2109   KETONESUR Negative 09/12/2013 [place] 09/03/2013 2109   PROTEINUR Negative 09/12/2013 [place] 09/03/2013 2109   UROBILINOGEN 0.2 09/12/2013 1542   UROBILINOGEN 0.2 09/03/2013 2109   NITRITE Negative 09/12/2013 1542   NITRITE NEGATIVE 09/03/2013 2109   LEUKOCYTESUR Negative 09/12/2013 1542   LEUKOCYTESUR NEGATIVE 09/03/2013 2109    STUDIES: No results found.  ASSESSMENT: 54 y.o. [place] woman with stage IV breast cancer, history as follows  (1)  S/p Right lumpectomy and sentinel lymph node sampling 03/15/2004 for a pT1c pN0. Stage IA invasive ductal carcinoma, grade 2, estrogen receptor 95% positive, progesterone receptor 65% positive, HER-2 not amplified; additional surgery  04/25/2004 for seroma or clearance showed no residual tumor  (2) adjuvant chemotherapy with cyclophosphamide and doxorubicin every 21 days x4 completed 07/19/2004  (3) adjuvant radiation given under Dr. Donella Stade in [place] completed July 2006  (4) the patient opted against adjuvant antiestrogen therapy  (5) genetics testing showed no BRCA mutations  (6) biopsy of a palpable right axillary mass 10/24/2009 showed invasive ductal carcinoma, grade 3, estrogen receptor 100% positive, progesterone receptor 2% positive (alert score 5) HER-2 negative; no evidence of systemic disease on PET scanning  (7) completed 3 of 4 planned cycles of docetaxel and cyclophosphamide September 2011, fourth cycle omitted because of marked elevations in liver function tests  (8) an right axillary lymph node dissection 03/06/2010 showed 3/8 lymph nodes removed to be involved by tumor, with extracapsular extension.  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, TTS-1 negative), estrogen receptor 80% positive, progesterone receptor 2% positive, HER-2 not amplified  (12) letrozole started March 2015-- tolerated with significant side effects, discontinued at the end of May 2015  (13) PET scan 08/16/2013 shows extensive left pleural metastatic disease and a large left pleural effusion that shifts cardiac and mediastinal structures to the right; adenopathy (celiac  trunk, periadrenal, periaortic); and a left medial clavicular lesion; Status post left thoracentesis 08/16/2013 positive for adenocarcinoma, estrogen receptor positive,  progesterone receptor negative.  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; everolimus was suggested as an available targeted agent  (18) exemestane started 03/31/2014  (a) everolimus added 04/03/2014 but not tolerated (cytopenias, elevated LFTs) even at minimal doses; stopped 04/17/2014    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 treated with Lovenox for 5-6 months  (c) right upper extremity lymphedema  (d) hepatic steatosis with chronically elevated LFTs as well as unusual hepatic sensitivity to chemotherapy  (e) osteopenia with the lowest T score -1.6 on bone density scan 06/20/2013  (f) radiation oncology (Dr Valere [name]) has reviewed prior radiation records in case there is further mediastinal involvement with dysphagia etc in which case palliative XRT could be considered  (a) radiation to left mediastinum/ left 7th rib 3250 cGy in 13 sessions04/18/2016 through 08/02/2014   PLAN: I spent a little over an hour with [name] [name] today. She was very distraught because she felt she was "not being fully informed". If Dr. Stanton Kidney is concerned that the airways to the left lung may be narrowing and may eventually be blocked, with subsequent lung collapse, Y had she not heard about this before?.  I listened very carefully to [place]. I think there is attn between the way she looks at her illness and the way her doctors including myself look at her illness.  She is very focused on the left lung. For her MDs, the key to stage IV breast cancer is systemic therapy. In general local treatment is used for symptom palliation but that does not affect survival. As far as her left lung is concerned, I have long felt that at some point she would lose use of that lung. In fact if she underwent decortication, and pneumonectomy, I think her longevity and functional status would not be affected. What would be affected of course would be her quality of life since that would be a very demanding procedure.   In short while she is very focused on the left lung area I am not sure how much of her left lung function she still has at this point and it may be that left lung collapse if it occurs would be clinically not significant.  Since there is documented involvement of at least a rib, we discussed zolendronate. She understands the possible toxicities, side effects and complications of that agent, and that information was given to her in writing. She will receive the first dose on May 17.  The point I'm trying to make and which I do believe she understood after our long discussion today, is that what we are looking for is a way to treat her systemically that will take care of the cancer everywhere. What concerns me is the fact that the cancer is slowly moving across the mediastinum and eventually will reach the pericardium and other vital areas. I continue to look for systemic treatment for her, but we are not making progress there and I  am concerned that the cancer continues to grow although very slowly.  [place] tells me she is not averse to systemic treatment. At the same time she does not want to add the Palbociclib at present because "if the exemestane doesn't work, adding the Palbociclib won't do anything".  I went ahead and canceled the weekly lab work that she had been scheduled for in expectation that she would be starting on the Palbociclib. Instead she will continue on  exemestane. She will have a repeat CT scan June 6 and see me June 7. Depending on those results, she may continue on exemestane and agree to add Palbociclib, switch to fulvestrant also with Palbociclib, or choose to participate in one of the studies that she is looking at at [facility] and [place].  She understands after the fulvestrant, if it doesn't work, the next step would be single agent chemotherapy.  I think after our long discussion today Barry Brunner feels better about the overall plan. I am hopeful we can start making progress with systemic therapy after the June CT scan. In the interim [place] knows to call with any problems that may develop for her next visit here   Chauncey Cruel, MD   08/04/2014 1:38 PM

## 2014-08-04 NOTE — Therapy (Signed)
[place], [place], [address] Phone: [phone]   Fax:  [phone]  Physical Therapy Treatment  Patient Details  Name: [name] [name] MRN: [id number] Date of Birth: 10-Mar-1961 Referring Provider:  Consuela Mimes, MD  Encounter Date: 08/04/2014      PT End of Session - 08/04/14 1134    Visit Number 37   Number of Visits 47   Date for PT Re-Evaluation 09/08/14   PT Start Time 1029   PT Stop Time 1112   PT Time Calculation (min) 43 min   Activity Tolerance Patient tolerated treatment well   Behavior During Therapy [facility] for tasks assessed/performed      Past Medical History  Diagnosis Date  . Seizures 2010    Isolated incident.  . Breast cancer dx'd 2005/2011  . PONV (postoperative nausea and vomiting)   . Peripheral vascular disease 02/2010    blood clot related to porta cath    Past Surgical History  Procedure Laterality Date  . Breast lumpectomy  2005  . Axillary lymph node dissection  Dec. 2011  . Portacath placement  12/11  . Removal portacath    . Mediastinotomy chamberlain mcneil Left 06/02/2013    Procedure: MEDIASTINOTOMY CHAMBERLAIN MCNEIL;  Surgeon: [name], MD;  Location: [facility];  Service: Thoracic;  Laterality: Left;  LEFT ANTERIOR MEDIASTINOTOMY     There were no vitals filed for this visit.  Visit Diagnosis:  Lymphedema  Other chest pain  Muscle stiffness      Subjective Assessment - 08/04/14 1030    Subjective Having reflux problems.  Has "incredible" pain with that.   Currently in Pain? Yes   Pain Score 5    Pain Location Throat   Pain Relieving Factors new medication   Effect of Pain on Daily Activities unable to sleep                         [facility] Adult PT Treatment/Exercise - 08/04/14 0001    Manual Therapy   Manual Lymphatic Drainage (MLD) In left sidelying, posterior interaxillary anastomosis and right axillo-inguinal  anastomosis; in supine, short neck, left axilla and anterior interaxillary anastomosis, right groin and axillo-inguinal anastomosis, area between right breast scars, directing toward pathways, and neck at lateral, anterolateral, and midline.  In right sidelying, left periscapular area toward left groin.    Manual Therapy Other (comment)  In right sidelying, soft tissue work to left upper back and    Manual Therapy   Myofascial Release At right axillary scar with soft tissue mobilization and myofascial release.                        [facility] Goals - 08/04/14 1136    CC Long Term Goal  #1   Baseline Recently more difficult to control pain, due to active cancer and/or radiaiton treatments.   Status On-going   CC Long Term Goal  #2   Status On-going            Plan - 08/04/14 1135    Clinical Impression Statement Patient with increasing pain, now from GERD that appears related to radiation treatment, which she completed on 07/03/14.   Pt will benefit from skilled therapeutic intervention in order to improve on the following deficits Increased edema;Pain;Increased fascial restricitons   Rehab Potential Fair   Clinical Impairments Affecting Rehab Potential active cancer   PT Frequency 2x /  week   PT Duration 6 weeks   PT Treatment/Interventions Manual lymph drainage;Manual techniques;Scar mobilization   PT Next Visit Plan Manual lymph dainage, myofascial release, soft tissue work; reassess.  Continue neck manual lymph drainage if patient felt benefit today.   Consulted and Agree with Plan of Care Patient        Problem List Patient Active Problem List   Diagnosis Date Noted  . 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 Chest wall pain 03/21/2014  . Abnormal LFTs (liver function tests) 09/12/2013  . Pleural effusion 08/18/2013  . 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 Breast cancer metastasized to multiple sites  08/18/2013    [facility] 08/04/2014, 12:48 PM  [place], [place], [address] Phone: [phone]   Fax:  [place], PT 08/04/2014 12:48 PM

## 2014-08-04 NOTE — Telephone Encounter (Signed)
Patient phoned requesting to speak with Val, RN. Explained Val, RN is out of the office. Patient demanded to speak with Dr. Valere Dross. Explained Dr. Valere Dross is out of the office. Offered to assist patient. Patient explained that she left a list of questions for Dr. Valere Dross with [name], RN but, hasn't heard back. Patient explains she has terrible reflux and is unable to sleep. Again, offered to assist patient. Patient insisted on speaking with a radiation oncologist "now" because she has an appointment with her medical oncologist at 1300 today and "needs answers." Transferred patient to Dr. Eppie Gibson.

## 2014-08-04 NOTE — Telephone Encounter (Signed)
Appointments made and avs printed for patient °

## 2014-08-07 ENCOUNTER — Telehealth: Payer: Self-pay | Admitting: Nurse Practitioner

## 2014-08-07 ENCOUNTER — Encounter: Payer: Self-pay | Admitting: Radiation Oncology

## 2014-08-07 ENCOUNTER — Other Ambulatory Visit: Payer: Self-pay

## 2014-08-07 ENCOUNTER — Ambulatory Visit: Payer: 59 | Admitting: Physical Therapy

## 2014-08-07 ENCOUNTER — Other Ambulatory Visit: Payer: Self-pay | Admitting: *Deleted

## 2014-08-07 DIAGNOSIS — K208 Other esophagitis without bleeding: Secondary | ICD-10-CM

## 2014-08-07 DIAGNOSIS — I890 Lymphedema, not elsewhere classified: Secondary | ICD-10-CM | POA: Diagnosis not present

## 2014-08-07 DIAGNOSIS — R0789 Other chest pain: Secondary | ICD-10-CM

## 2014-08-07 DIAGNOSIS — M6289 Other specified disorders of muscle: Secondary | ICD-10-CM

## 2014-08-07 DIAGNOSIS — E860 Dehydration: Secondary | ICD-10-CM

## 2014-08-07 MED ORDER — OXYCODONE HCL 5 MG PO TABS: 5.0000 mg | ORAL_TABLET | ORAL | Status: DC | PRN

## 2014-08-07 NOTE — Progress Notes (Addendum)
Chart note: I spoke with [name] [name] this morning.  She is on Carafate slurry and also Viscous Xylocaine which last only 1-2 hours in terms of symptomatic relief of her esophagitis.  She is taking in little in the way of fluid or nutrition because of her esophagitis.  I told her that she needs to start a pain medication, she does have oxycodone at home.  She tells me that one half a pill can send her into "la la land" , that she prefers not to take any narcotic.  I told her that she needs to take a narcotic although she is somewhat resistant.  I also suggested to her that she take an acid for the possibility of acid reflux.  Her degree of esophagitis is not expected considering the dose of radiation therapy that she received.  However, I feel that this should be short-lived.  I told her that I can see her anytime today, or work her in tomorrow for a follow-up visit. Addendum: Her husband called me back, and they will need more narcotics.  Her medical record shows that she is allergic to narcotics in terms of nausea and vomiting.  She states that she is able to tolerate oxycodone and wants me to go ahead and write a prescription.  I'll write a prescription for oxycodone 5 mg tabs dispense #30.

## 2014-08-07 NOTE — Telephone Encounter (Signed)
Spoke with husband and will check with his wife to see if she wants to come in to see CB or if she wants to see Dr.Murray. Her husband will call back to schedule.

## 2014-08-07 NOTE — Progress Notes (Signed)
Chart note: I called [name] [name] this evening to see how she was doing.  She declined seeing [name] earlier today.  She is having less nausea this afternoon and she did take Prilosec.  I want her about dehydration.  She knows that we are willing to see her at any time.

## 2014-08-07 NOTE — Therapy (Signed)
[place], [place], [address] Phone: [phone]   Fax:  [phone]  Physical Therapy Treatment  Patient Details  Name: [name] [name] MRN: [id number] Date of Birth: 30-Oct-1960 Referring Provider:  Chauncey Cruel, MD  Encounter Date: 08/07/2014      PT End of Session - 08/07/14 1243    Visit Number 38   Number of Visits 54   Date for PT Re-Evaluation 09/08/14   PT Start Time 0940   PT Stop Time 1016   PT Time Calculation (min) 36 min   Activity Tolerance Patient tolerated treatment well      Past Medical History  Diagnosis Date  . Seizures 2010    Isolated incident.  . Breast cancer dx'd 2005/2011  . PONV (postoperative nausea and vomiting)   . Peripheral vascular disease 02/2010    blood clot related to porta cath    Past Surgical History  Procedure Laterality Date  . Breast lumpectomy  2005  . Axillary lymph node dissection  Dec. 2011  . Portacath placement  12/11  . Removal portacath    . Mediastinotomy chamberlain mcneil Left 06/02/2013    Procedure: MEDIASTINOTOMY CHAMBERLAIN MCNEIL;  Surgeon: [name], MD;  Location: [place];  Service: Thoracic;  Laterality: Left;  LEFT ANTERIOR MEDIASTINOTOMY     There were no vitals filed for this visit.  Visit Diagnosis:  Lymphedema  Muscle stiffness  Other chest pain      Subjective Assessment - 08/07/14 0940    Subjective "I haven't slept in three days."   Pertinent History patient vomited just prior to treatment today   Currently in Pain? Yes   Pain Score 10-Worst pain ever   Pain Location Chest   Pain Orientation Mid   Pain Descriptors / Indicators Burning   Pain Type Acute pain   Aggravating Factors  recent radiation   Pain Relieving Factors meds (sulfacate) help for a couple hours                         [facility] Adult PT Treatment/Exercise - 08/07/14 0001    Manual Therapy   Manual  Lymphatic Drainage (MLD) In left sidelying, posterior interaxillary anastomosis and right axillo-inguinal anastomosis; in supine, short neck, left axilla and anterior interaxillary anastomosis, right groin and axillo-inguinal anastomosis, area between right breast scars, directing toward pathways, and neck at lateral, anterolateral, and midline.  In right sidelying, left periscapular area toward left groin.    Manual Therapy Other (comment)  In right sidelying, soft tissue work to left upper back and                         [facility] Goals - 08/04/14 1136    CC Long Term Goal  #1   Baseline Recently more difficult to control pain, due to active cancer and/or radiaiton treatments.   Status On-going   CC Long Term Goal  #2   Status On-going            Plan - 08/07/14 1244    Clinical Impression Statement Patient clearly not feeling well today, but did have benefit in decreased discomfort from therapy session.  Area at right lateral breast incisions very full and firm feeling today at start of session.   Pt will benefit from skilled therapeutic intervention in order to improve on the following deficits Increased edema;Pain;Increased fascial restricitons   Rehab  Potential Fair   PT Treatment/Interventions Manual lymph drainage;Manual techniques   PT Next Visit Plan Manual lymph drainage, myofascial release, soft tissue work.  Reassess.        Problem List Patient Active Problem List   Diagnosis Date Noted  . 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 Chest wall pain 03/21/2014  . Abnormal LFTs (liver function tests) 09/12/2013  . Pleural effusion 08/18/2013  . 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 Breast cancer metastasized to multiple sites 08/18/2013    [facility] 08/07/2014, 12:47 PM  [place], [place], [address] Phone:  [phone]   Fax:  [place], PT 08/07/2014 12:47 PM

## 2014-08-08 ENCOUNTER — Telehealth: Payer: Self-pay | Admitting: *Deleted

## 2014-08-08 ENCOUNTER — Encounter: Payer: Self-pay | Admitting: Radiation Oncology

## 2014-08-08 NOTE — Telephone Encounter (Signed)
Patient states that she has not recovered fully from her recent radiation treatment. Patient would like to cancel her upcoming infusion appointment on 08/14/14. She would like to resume on 09/11/14 after her MD appointment. Message sent to MD and RN

## 2014-08-08 NOTE — Progress Notes (Signed)
Special treatment procedure note: The patient underwent re-radiation of her chest requiring complex planning with reconstruction of her previous radiation therapy fields.  There was overlap with her previous treatment, and the extra time and complexity justifies a special treatment procedure.  This was performed on 07/10/2014.

## 2014-08-09 ENCOUNTER — Other Ambulatory Visit: Payer: Self-pay | Admitting: *Deleted

## 2014-08-09 DIAGNOSIS — C50211 Malignant neoplasm of upper-inner quadrant of right female breast: Secondary | ICD-10-CM

## 2014-08-09 DIAGNOSIS — C50919 Malignant neoplasm of unspecified site of unspecified female breast: Secondary | ICD-10-CM

## 2014-08-09 DIAGNOSIS — J90 Pleural effusion, not elsewhere classified: Secondary | ICD-10-CM

## 2014-08-09 DIAGNOSIS — R945 Abnormal results of liver function studies: Secondary | ICD-10-CM

## 2014-08-09 DIAGNOSIS — R7989 Other specified abnormal findings of blood chemistry: Secondary | ICD-10-CM

## 2014-08-09 NOTE — Telephone Encounter (Signed)
Per note CT of chest order entered for requested date and specifics for IV access to be obtained at the [facility].  Appointment for zometa will be cancelled at this time as well.  Thank you
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
## 2014-08-10 ENCOUNTER — Telehealth: Payer: Self-pay | Admitting: *Deleted

## 2014-08-10 ENCOUNTER — Ambulatory Visit: Payer: 59 | Admitting: Physical Therapy

## 2014-08-10 DIAGNOSIS — I890 Lymphedema, not elsewhere classified: Secondary | ICD-10-CM | POA: Diagnosis not present

## 2014-08-10 DIAGNOSIS — R0789 Other chest pain: Secondary | ICD-10-CM

## 2014-08-10 DIAGNOSIS — M6289 Other specified disorders of muscle: Secondary | ICD-10-CM

## 2014-08-10 NOTE — Telephone Encounter (Signed)
This RN called pt and confirmed POF was sent with requested dates as well as CT order placed for 6/6 with IV access to be obtained at this office prior to scan.

## 2014-08-10 NOTE — Telephone Encounter (Signed)
Tc from patient requesting call back from Val, RN with Dr. Jana Hakim regarding numerous appt changes/cancelations. CT scan schedule, Zometa and labs on 08/14/15 to be cancelled etc.

## 2014-08-10 NOTE — Therapy (Signed)
[place], [place], [address] Phone: [phone]   Fax:  [phone]  Physical Therapy Treatment  Patient Details  Name: [name] [name] MRN: [id number] Date of Birth: [birth year] Referring Provider:  Chauncey Cruel, MD  Encounter Date: 08/10/2014      PT End of Session - 08/10/14 0935    Visit Number 39   Number of Visits 47   Date for PT Re-Evaluation 09/08/14   PT Start Time 0825   PT Stop Time 0912   PT Time Calculation (min) 47 min   Activity Tolerance Patient tolerated treatment well   Behavior During Therapy [facility] for tasks assessed/performed      Past Medical History  Diagnosis Date  . Seizures 2010    Isolated incident.  . Breast cancer dx'd 2005/2011  . PONV (postoperative nausea and vomiting)   . Peripheral vascular disease 02/2010    blood clot related to porta cath    Past Surgical History  Procedure Laterality Date  . Breast lumpectomy  2005  . Axillary lymph node dissection  Dec. 2011  . Portacath placement  12/11  . Removal portacath    . Mediastinotomy chamberlain mcneil Left 06/02/2013    Procedure: MEDIASTINOTOMY CHAMBERLAIN MCNEIL;  Surgeon: [name], MD;  Location: [place];  Service: Thoracic;  Laterality: Left;  LEFT ANTERIOR MEDIASTINOTOMY     There were no vitals filed for this visit.  Visit Diagnosis:  Lymphedema  Muscle stiffness  Other chest pain      Subjective Assessment - 08/10/14 0826    Subjective Feeling some better, but "I have a grapefruit in there (under right arm)"  Stopped the oxycodone.  Had a bowel movement, and that helped.  Still getting a pressure feeling in the chest; sulcrafate every three hours helps.   Currently in Pain? Yes   Pain Score 6   3 on left side   Pain Location Axilla   Pain Orientation Right                         [facility] Adult PT Treatment/Exercise - 08/10/14 0001    Manual  Therapy   Manual Lymphatic Drainage (MLD) In left sidelying, posterior interaxillary anastomosis and right axillo-inguinal anastomosis; in supine, short neck, left axilla and anterior interaxillary anastomosis, right groin and axillo-inguinal anastomosis, area between right breast scars, directing toward pathways, and neck at lateral, and anterolateral,.  In right sidelying, left periscapular area toward left groin.    Manual Therapy Other (comment)  In right sidelying, soft tissue work to left upper back and    Manual Therapy   Myofascial Release At right axillary scar with soft tissue mobilization and myofascial release.                        [facility] Goals - 08/10/14 4401    CC Long Term Goal  #1   Baseline Recently more difficult to control pain, due to active cancer and/or radiation treatments.   Status On-going   CC Long Term Goal  #2   Baseline Swelling has been controlled adequately with 2x/week therapy; goal is ongoing.   Status On-going            Plan - 08/10/14 0936    Clinical Impression Statement Patient clearly feeling much better today from the reflux pain.  Right lateral breast area with a high amount of induration today  at start of session.  Therapy continues to decrease swelling and discomfort that comes from that and from muscle tightness at left upper back.   Pt will benefit from skilled therapeutic intervention in order to improve on the following deficits Increased edema;Pain;Increased fascial restricitons   Rehab Potential Good   PT Frequency 2x / week   PT Duration 6 weeks   PT Treatment/Interventions Manual lymph drainage;Manual techniques   PT Next Visit Plan Manual lymph drainage, myofascial release, soft tissue work.   Consulted and Agree with Plan of Care Patient        Problem List Patient Active Problem List   Diagnosis Date Noted  . 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 Chest wall pain 03/21/2014  .  Abnormal LFTs (liver function tests) 09/12/2013  . Pleural effusion 08/18/2013  . 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 Breast cancer metastasized to multiple sites 08/18/2013    [facility] 08/10/2014, 9:40 AM  [place], [place], [address] Phone: [phone]   Fax:  [place], PT 08/10/2014 9:40 AM

## 2014-08-14 ENCOUNTER — Other Ambulatory Visit: Payer: Self-pay

## 2014-08-14 ENCOUNTER — Ambulatory Visit: Payer: 59 | Admitting: Physical Therapy

## 2014-08-14 ENCOUNTER — Ambulatory Visit: Payer: Self-pay

## 2014-08-14 ENCOUNTER — Other Ambulatory Visit: Payer: Self-pay | Admitting: *Deleted

## 2014-08-14 DIAGNOSIS — I890 Lymphedema, not elsewhere classified: Secondary | ICD-10-CM | POA: Diagnosis not present

## 2014-08-14 DIAGNOSIS — M6289 Other specified disorders of muscle: Secondary | ICD-10-CM

## 2014-08-14 DIAGNOSIS — R0789 Other chest pain: Secondary | ICD-10-CM

## 2014-08-14 NOTE — Therapy (Signed)
[place], [place], [address] Phone: [phone]   Fax:  [phone]  Physical Therapy Treatment  Patient Details  Name: [name] [name] MRN: [id number] Date of Birth: 09/22/1960 Referring Provider:  Consuela Mimes, MD  Encounter Date: 08/14/2014      PT End of Session - 08/14/14 1245    Visit Number 40   Number of Visits 47   Date for PT Re-Evaluation 09/08/14   PT Start Time 1025   PT Stop Time 1104   PT Time Calculation (min) 39 min   Activity Tolerance Patient tolerated treatment well   Behavior During Therapy [facility] for tasks assessed/performed      Past Medical History  Diagnosis Date  . Seizures 2010    Isolated incident.  . Breast cancer dx'd 2005/2011  . PONV (postoperative nausea and vomiting)   . Peripheral vascular disease 02/2010    blood clot related to porta cath    Past Surgical History  Procedure Laterality Date  . Breast lumpectomy  2005  . Axillary lymph node dissection  Dec. 2011  . Portacath placement  12/11  . Removal portacath    . Mediastinotomy chamberlain mcneil Left 06/02/2013    Procedure: MEDIASTINOTOMY CHAMBERLAIN MCNEIL;  Surgeon: [name], MD;  Location: [place];  Service: Thoracic;  Laterality: Left;  LEFT ANTERIOR MEDIASTINOTOMY     There were no vitals filed for this visit.  Visit Diagnosis:  Lymphedema  Muscle stiffness  Other chest pain      Subjective Assessment - 08/14/14 1028    Subjective Had a good weekend.     Currently in Pain? Yes   Pain Score 4   "and left side not too bad"   Pain Location Axilla   Pain Orientation Right   Aggravating Factors  coughing?   Pain Relieving Factors prilosec                         [facility] Adult PT Treatment/Exercise - 08/14/14 0001    Manual Therapy   Manual Lymphatic Drainage (MLD) In left sidelying, posterior interaxillary anastomosis and right axillo-inguinal  anastomosis; in supine, short neck, left axilla and anterior interaxillary anastomosis, right groin and axillo-inguinal anastomosis, area between right breast scars, directing toward pathways, and right upper arm.  In right sidelying, left periscapular area toward left groin.   Manual Therapy Other (comment)  In right sidelying, soft tissue work to left upper back and    Manual Therapy   Myofascial Release At right axillary scar with soft tissue mobilization and myofascial release.                        [facility] Goals - 08/14/14 1250    CC Long Term Goal  #1   Status On-going   CC Long Term Goal  #2   Status On-going            Plan - 08/14/14 1246    Clinical Impression Statement Patient again clearly feeling much better from reflux pain, and reports she has been able to decrease the meds for this.     Pt will benefit from skilled therapeutic intervention in order to improve on the following deficits Increased edema;Pain;Increased fascial restricitons   Rehab Potential Good   Clinical Impairments Affecting Rehab Potential active cancer   PT Frequency 2x / week   PT Duration 6 weeks   PT Treatment/Interventions  Manual lymph drainage;Manual techniques   PT Next Visit Plan Manual lymph drainage, myofascial release, soft tissue work.   Consulted and Agree with Plan of Care Patient        Problem List Patient Active Problem List   Diagnosis Date Noted  . 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 Chest wall pain 03/21/2014  . Abnormal LFTs (liver function tests) 09/12/2013  . Pleural effusion 08/18/2013  . 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 Breast cancer metastasized to multiple sites 08/18/2013    [facility] 08/14/2014, 12:51 PM  [place], [place], [address] Phone: [phone]   Fax:  [place], PT 08/14/2014 12:51 PM

## 2014-08-15 ENCOUNTER — Encounter: Payer: Self-pay | Admitting: Radiation Oncology

## 2014-08-15 ENCOUNTER — Telehealth: Payer: Self-pay | Admitting: *Deleted

## 2014-08-15 NOTE — Telephone Encounter (Signed)
[name] [name] called today to report that her acid reflux symptoms are better since starting Prilosec, however she is noting a "slight" dry cough when she is outside and when talking.  Relayed that acid reflux can cause a cough, and encouraged her to continue Prilosec for this week, with the caveat to inform us if her cough worsens.  She stated understanding.

## 2014-08-18 ENCOUNTER — Other Ambulatory Visit: Payer: Self-pay

## 2014-08-18 ENCOUNTER — Ambulatory Visit: Payer: 59 | Admitting: Physical Therapy

## 2014-08-18 DIAGNOSIS — M6289 Other specified disorders of muscle: Secondary | ICD-10-CM

## 2014-08-18 DIAGNOSIS — R0789 Other chest pain: Secondary | ICD-10-CM

## 2014-08-18 DIAGNOSIS — I890 Lymphedema, not elsewhere classified: Secondary | ICD-10-CM

## 2014-08-18 NOTE — Therapy (Signed)
[place], [place], [address] Phone: [phone]   Fax:  [phone]  Physical Therapy Treatment  Patient Details  Name: [name] [name] MRN: [id number] Date of Birth: June 06, 1960 Referring Provider:  Consuela Mimes, MD  Encounter Date: 08/18/2014      PT End of Session - 08/18/14 1246    Visit Number 41   Number of Visits 47   Date for PT Re-Evaluation 09/08/14   PT Start Time 6967   PT Stop Time 1106   PT Time Calculation (min) 43 min   Activity Tolerance Patient tolerated treatment well   Behavior During Therapy [facility] for tasks assessed/performed      Past Medical History  Diagnosis Date  . Seizures 2010    Isolated incident.  . Breast cancer dx'd 2005/2011  . PONV (postoperative nausea and vomiting)   . Peripheral vascular disease 02/2010    blood clot related to porta cath    Past Surgical History  Procedure Laterality Date  . Breast lumpectomy  2005  . Axillary lymph node dissection  Dec. 2011  . Portacath placement  12/11  . Removal portacath    . Mediastinotomy chamberlain mcneil Left 06/02/2013    Procedure: MEDIASTINOTOMY CHAMBERLAIN MCNEIL;  Surgeon: [name], MD;  Location: [place];  Service: Thoracic;  Laterality: Left;  LEFT ANTERIOR MEDIASTINOTOMY     There were no vitals filed for this visit.  Visit Diagnosis:  Lymphedema  Muscle stiffness  Other chest pain      Subjective Assessment - 08/18/14 1024    Subjective Had my first good night's sleep last night in a long time.  Wednesday had really bad pain (9/10) at left mid-back; got drained yesterday and that helped immediately.   Currently in Pain? Yes   Pain Score 3    Pain Location Flank  also right axilla   Pain Orientation Left   Pain Relieving Factors getting drained                         [facility] Adult PT Treatment/Exercise - 08/18/14 0001    Manual Therapy   Manual  Lymphatic Drainage (MLD) In left sidelying, posterior interaxillary anastomosis and right axillo-inguinal anastomosis; in supine, short neck, left axilla and anterior interaxillary anastomosis, right groin and axillo-inguinal anastomosis, area between right breast scars, directing toward pathways.  In right sidelying, left periscapular area toward left groin.   Manual Therapy Other (comment)  In Rt. sidelying, soft tissue work to left upper back, neck    Manual Therapy   Myofascial Release At right axillary scar with soft tissue mobilization and myofascial release.                        [facility] Goals - 08/18/14 1248    CC Long Term Goal  #1   Status On-going   CC Long Term Goal  #2   Status On-going            Plan - 08/18/14 1247    Clinical Impression Statement Had spike in pain on left side earlier this week, relieved somewhat by draining PleurX tube; therapy continues to benefit with edema and discomfort.   Pt will benefit from skilled therapeutic intervention in order to improve on the following deficits Increased edema;Pain;Increased fascial restricitons   Rehab Potential Good   Clinical Impairments Affecting Rehab Potential active cancer   PT Frequency 2x /  week   PT Duration 6 weeks   PT Treatment/Interventions Manual lymph drainage;Manual techniques   PT Next Visit Plan Manual lymph drainage, myofascial release, soft tissue work.   Consulted and Agree with Plan of Care Patient        Problem List Patient Active Problem List   Diagnosis Date Noted  . 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 Chest wall pain 03/21/2014  . Abnormal LFTs (liver function tests) 09/12/2013  . Pleural effusion 08/18/2013  . 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 Secondary malignant neoplasm of mediastinal lymph node 08/18/2013    [name],[name] 08/18/2014, 12:48 PM  [place], [place], [address] Phone: [phone]   Fax:  [place], PT 08/18/2014 12:49 PM

## 2014-08-21 ENCOUNTER — Ambulatory Visit: Payer: Self-pay | Admitting: Oncology

## 2014-08-21 ENCOUNTER — Other Ambulatory Visit: Payer: Self-pay

## 2014-08-21 ENCOUNTER — Ambulatory Visit: Payer: 59 | Admitting: Physical Therapy

## 2014-08-21 DIAGNOSIS — I890 Lymphedema, not elsewhere classified: Secondary | ICD-10-CM | POA: Diagnosis not present

## 2014-08-21 DIAGNOSIS — M6289 Other specified disorders of muscle: Secondary | ICD-10-CM

## 2014-08-21 DIAGNOSIS — R0789 Other chest pain: Secondary | ICD-10-CM

## 2014-08-21 NOTE — Therapy (Signed)
[place], [place], [address] Phone: [phone]   Fax:  [phone]  Physical Therapy Treatment  Patient Details  Name: [name] [name] MRN: [id number] Date of Birth: 06-29-1960 Referring Provider:  Chauncey Cruel, MD  Encounter Date: 08/21/2014      PT End of Session - 08/21/14 1234    Visit Number 42   Number of Visits 25   Date for PT Re-Evaluation 09/08/14   PT Start Time 1110   PT Stop Time 1152   PT Time Calculation (min) 42 min   Activity Tolerance Patient tolerated treatment well   Behavior During Therapy [facility] for tasks assessed/performed      Past Medical History  Diagnosis Date  . Seizures 2010    Isolated incident.  . Breast cancer dx'd 2005/2011  . PONV (postoperative nausea and vomiting)   . Peripheral vascular disease 02/2010    blood clot related to porta cath    Past Surgical History  Procedure Laterality Date  . Breast lumpectomy  2005  . Axillary lymph node dissection  Dec. 2011  . Portacath placement  12/11  . Removal portacath    . Mediastinotomy chamberlain mcneil Left 06/02/2013    Procedure: MEDIASTINOTOMY CHAMBERLAIN MCNEIL;  Surgeon: [name], MD;  Location: [place];  Service: Thoracic;  Laterality: Left;  LEFT ANTERIOR MEDIASTINOTOMY     There were no vitals filed for this visit.  Visit Diagnosis:  Lymphedema  Muscle stiffness  Other chest pain      Subjective Assessment - 08/21/14 1112    Currently in Pain? Yes   Pain Score 3    Pain Location Flank  and right axilla   Pain Orientation Left                         [facility] Adult PT Treatment/Exercise - 08/21/14 0001    Manual Therapy   Manual Lymphatic Drainage (MLD) In left sidelying, posterior interaxillary anastomosis and right axillo-inguinal anastomosis; in supine, short neck, left axilla and anterior interaxillary anastomosis, right groin and  axillo-inguinal anastomosis, area between right breast scars, directing toward pathways.  In right sidelying, left periscapular area toward left groin.   Manual Therapy Other (comment)  In Rt. sidelying, soft tissue work to left upper back, neck    Manual Therapy   Myofascial Release At right axillary scar with soft tissue mobilization and myofascial release.                        [facility] Goals - 08/18/14 1248    CC Long Term Goal  #1   Status On-going   CC Long Term Goal  #2   Status On-going            Plan - 08/21/14 1235    Clinical Impression Statement Mass at scar at left upper chest feels smaller to palpation.  Patient continues to feel better, though coughs from reflux.  Treatment helps patient feel better by end of session with less pain, less swelling.   Pt will benefit from skilled therapeutic intervention in order to improve on the following deficits Increased edema;Pain;Increased fascial restricitons   Rehab Potential Good   Clinical Impairments Affecting Rehab Potential active cancer   PT Treatment/Interventions Manual lymph drainage;Manual techniques   PT Next Visit Plan Manual lymph drainage, myofascial release, soft tissue work.   Consulted and Agree with Plan of Care  Patient        Problem List Patient Active Problem List   Diagnosis Date Noted  . 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 Chest wall pain 03/21/2014  . Abnormal LFTs (liver function tests) 09/12/2013  . Pleural effusion 08/18/2013  . 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

## 2014-08-22 ENCOUNTER — Telehealth: Payer: Self-pay | Admitting: *Deleted

## 2014-08-22 ENCOUNTER — Other Ambulatory Visit: Payer: Self-pay | Admitting: Radiation Oncology

## 2014-08-22 DIAGNOSIS — J189 Pneumonia, unspecified organism: Secondary | ICD-10-CM

## 2014-08-22 MED ORDER — BENZONATATE 100 MG PO CAPS: 100.0000 mg | ORAL_CAPSULE | Freq: Three times a day (TID) | ORAL | Status: DC | PRN

## 2014-08-22 NOTE — Telephone Encounter (Signed)
Returned [name] [name]'s call regarding continued cough that has been present since 08/15/14. Explained, per Dr. Valere Dross, that her present cough is not related to radiation pneumonitis which typically occurs at least 8 weeks after the end of radiation therapy.  Constantly coughing, (dry cough), while talking on the phone.

## 2014-08-22 NOTE — Telephone Encounter (Signed)
Called and left voice message for [name] [name], that Dr. Valere Dross has called in her script to her pharmacy.  Encouraged her to call the pharmacy to ascertain when her script will be ready for pick-up.

## 2014-08-24 ENCOUNTER — Telehealth: Payer: Self-pay | Admitting: *Deleted

## 2014-08-24 NOTE — Telephone Encounter (Signed)
Patient has pleuRx catheter [facility] goes twice weekly to see patient.  Asked for call transfer to [facility] nurse.  Call transferred.  Voicemail obtained.

## 2014-08-25 ENCOUNTER — Ambulatory Visit: Payer: 59 | Admitting: Physical Therapy

## 2014-08-25 DIAGNOSIS — M6289 Other specified disorders of muscle: Secondary | ICD-10-CM

## 2014-08-25 DIAGNOSIS — I890 Lymphedema, not elsewhere classified: Secondary | ICD-10-CM

## 2014-08-25 DIAGNOSIS — R0789 Other chest pain: Secondary | ICD-10-CM

## 2014-08-25 NOTE — Therapy (Signed)
[place], [place], [address] Phone: [phone]   Fax:  [phone]  Physical Therapy Treatment  Patient Details  Name: [name] [name] MRN: [id number] Date of Birth: 1961/01/06 Referring Provider:  Chauncey Cruel, MD  Encounter Date: 08/25/2014      PT End of Session - 08/25/14 1212    Visit Number 43   Number of Visits 47   Date for PT Re-Evaluation 09/08/14   PT Start Time 1032   PT Stop Time 1112   PT Time Calculation (min) 40 min   Activity Tolerance Patient tolerated treatment well   Behavior During Therapy [facility] for tasks assessed/performed      Past Medical History  Diagnosis Date  . Seizures 2010    Isolated incident.  . Breast cancer dx'd 2005/2011  . PONV (postoperative nausea and vomiting)   . Peripheral vascular disease 02/2010    blood clot related to porta cath    Past Surgical History  Procedure Laterality Date  . Breast lumpectomy  2005  . Axillary lymph node dissection  Dec. 2011  . Portacath placement  12/11  . Removal portacath    . Mediastinotomy chamberlain mcneil Left 06/02/2013    Procedure: MEDIASTINOTOMY CHAMBERLAIN MCNEIL;  Surgeon: [name], MD;  Location: [place];  Service: Thoracic;  Laterality: Left;  LEFT ANTERIOR MEDIASTINOTOMY     There were no vitals filed for this visit.  Visit Diagnosis:  Lymphedema  Muscle stiffness  Other chest pain                       [facility] Adult PT Treatment/Exercise - 08/25/14 0001    Manual Therapy   Manual Lymphatic Drainage (MLD) In left sidelying, posterior interaxillary anastomosis and right axillo-inguinal anastomosis; in supine, short neck, left axilla and anterior interaxillary anastomosis, right groin and axillo-inguinal anastomosis, area between right breast scars, directing toward pathways.  In right sidelying, left periscapular area toward left groin.   Manual Therapy  Other (comment)  In Rt. sidelying, soft tissue work to left upper back, neck    Manual Therapy   Myofascial Release At right axillary scar with soft tissue mobilization and myofascial release.                        [facility] Goals - 08/25/14 1217    CC Long Term Goal  #1   Status On-going   CC Long Term Goal  #2   Status On-going            Plan - 08/25/14 1213    Clinical Impression Statement Area at right lateral breast between scars feel softer and less full today, as does left upper back.  Pt. continues to cough.   Pt will benefit from skilled therapeutic intervention in order to improve on the following deficits Increased edema;Pain;Increased fascial restricitons   Rehab Potential Good   Clinical Impairments Affecting Rehab Potential active cancer   PT Frequency 2x / week   PT Duration 6 weeks   PT Treatment/Interventions Manual lymph drainage;Manual techniques   PT Next Visit Plan Manual lymph drainage, myofascial release, soft tissue work.   Consulted and Agree with Plan of Care Patient        Problem List Patient Active Problem List   Diagnosis Date Noted  . 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 Chest wall pain 03/21/2014  . Abnormal LFTs (liver function tests)  09/12/2013  . Pleural effusion 08/18/2013  . 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 Secondary malignant neoplasm of mediastinal lymph node 08/18/2013    [name],[name] 08/25/2014, 12:18 PM  [place], [place], [address] Phone: [phone]   Fax:  [place], PT 08/25/2014 12:18 PM

## 2014-08-29 ENCOUNTER — Ambulatory Visit: Payer: 59 | Admitting: Physical Therapy

## 2014-08-29 DIAGNOSIS — M6289 Other specified disorders of muscle: Secondary | ICD-10-CM

## 2014-08-29 DIAGNOSIS — I890 Lymphedema, not elsewhere classified: Secondary | ICD-10-CM

## 2014-08-29 DIAGNOSIS — R0789 Other chest pain: Secondary | ICD-10-CM

## 2014-08-29 NOTE — Therapy (Signed)
[place], [place], [address] Phone: [phone]   Fax:  [phone]  Physical Therapy Treatment  Patient Details  Name: [name] [name] MRN: [id number] Date of Birth: [birth year] Referring Provider:  Chauncey Cruel, MD  Encounter Date: 08/29/2014      PT End of Session - 08/29/14 1501    Visit Number 44   Number of Visits 47   Date for PT Re-Evaluation 09/08/14   PT Start Time 4010   PT Stop Time 1349   PT Time Calculation (min) 44 min   Activity Tolerance Patient tolerated treatment well   Behavior During Therapy [facility] for tasks assessed/performed      Past Medical History  Diagnosis Date  . Seizures 2010    Isolated incident.  . Breast cancer dx'd 2005/2011  . PONV (postoperative nausea and vomiting)   . Peripheral vascular disease 02/2010    blood clot related to porta cath    Past Surgical History  Procedure Laterality Date  . Breast lumpectomy  2005  . Axillary lymph node dissection  Dec. 2011  . Portacath placement  12/11  . Removal portacath    . Mediastinotomy chamberlain mcneil Left 06/02/2013    Procedure: MEDIASTINOTOMY CHAMBERLAIN MCNEIL;  Surgeon: [name], MD;  Location: [place];  Service: Thoracic;  Laterality: Left;  LEFT ANTERIOR MEDIASTINOTOMY     There were no vitals filed for this visit.  Visit Diagnosis:  Lymphedema  Muscle stiffness  Other chest pain      Subjective Assessment - 08/29/14 1307    Subjective getting better with the reflux   Currently in Pain? Yes   Pain Score 3    Pain Location Flank  and right lateral breast   Pain Orientation Left                         [facility] Adult PT Treatment/Exercise - 08/29/14 0001    Manual Therapy   Manual Lymphatic Drainage (MLD) In left sidelying, posterior interaxillary anastomosis and right axillo-inguinal anastomosis; in supine, short neck, left axilla and anterior  interaxillary anastomosis, right groin and axillo-inguinal anastomosis, area between right breast scars, directing toward pathways.  In right sidelying, left periscapular area toward left groin.   Manual Therapy Other (comment)  In Rt. sidelying, soft tissue work to left upper back, neck    Manual Therapy   Myofascial Release At right axillary scar with soft tissue mobilization and myofascial release.                        [facility] Goals - 08/29/14 1503    CC Long Term Goal  #1   Baseline Doing better with this currently (08/29/14), now a few weeks out from most recent radiation treatment; goal is ongoing.   Status On-going   CC Long Term Goal  #2   Baseline Swelling has been controlled adequately with 2x/week therapy; goal is ongoing.   Status On-going            Plan - 08/29/14 1502    Clinical Impression Statement patient is clearly feeling more herself the last few days with less coughing; some increased induration at right lateral breast scar area compared to last session today   Pt will benefit from skilled therapeutic intervention in order to improve on the following deficits Increased edema;Pain;Increased fascial restricitons   Rehab Potential Good   Clinical Impairments  Affecting Rehab Potential active cancer   PT Frequency 2x / week   PT Duration 6 weeks   PT Treatment/Interventions Manual lymph drainage;Manual techniques   PT Next Visit Plan Manual lymph drainage, myofascial release, soft tissue work.   Consulted and Agree with Plan of Care Patient        Problem List Patient Active Problem List   Diagnosis Date Noted  . 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 Chest wall pain 03/21/2014  . Abnormal LFTs (liver function tests) 09/12/2013  . Pleural effusion 08/18/2013  . 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 Secondary malignant neoplasm of mediastinal lymph node 08/18/2013     [name],[name] 08/29/2014, 3:05 PM  [place], [place], [address] Phone: [phone]   Fax:  [place], PT 08/29/2014 3:06 PM

## 2014-08-30 ENCOUNTER — Encounter: Payer: Self-pay | Admitting: Physical Therapy

## 2014-09-01 ENCOUNTER — Ambulatory Visit: Payer: 59 | Attending: Oncology | Admitting: Physical Therapy

## 2014-09-01 DIAGNOSIS — R0789 Other chest pain: Secondary | ICD-10-CM | POA: Insufficient documentation

## 2014-09-01 DIAGNOSIS — G729 Myopathy, unspecified: Secondary | ICD-10-CM | POA: Diagnosis present

## 2014-09-01 DIAGNOSIS — M6289 Other specified disorders of muscle: Secondary | ICD-10-CM

## 2014-09-01 DIAGNOSIS — I890 Lymphedema, not elsewhere classified: Secondary | ICD-10-CM | POA: Diagnosis not present

## 2014-09-01 NOTE — Therapy (Signed)
[place], [place], [address] Phone: [phone]   Fax:  [phone]  Physical Therapy Treatment  Patient Details  Name: [name] [name] MRN: [id number] Date of Birth: 1960-07-09 Referring Provider:  Consuela Mimes, MD  Encounter Date: 09/01/2014      PT End of Session - 09/01/14 1201    Visit Number 45   Number of Visits 47   Date for PT Re-Evaluation 09/08/14   PT Start Time 1022   PT Stop Time 1107   PT Time Calculation (min) 45 min   Activity Tolerance Patient tolerated treatment well   Behavior During Therapy [facility] for tasks assessed/performed      Past Medical History  Diagnosis Date  . Seizures 2010    Isolated incident.  . Breast cancer dx'd 2005/2011  . PONV (postoperative nausea and vomiting)   . Peripheral vascular disease 02/2010    blood clot related to porta cath    Past Surgical History  Procedure Laterality Date  . Breast lumpectomy  2005  . Axillary lymph node dissection  Dec. 2011  . Portacath placement  12/11  . Removal portacath    . Mediastinotomy chamberlain mcneil Left 06/02/2013    Procedure: MEDIASTINOTOMY CHAMBERLAIN MCNEIL;  Surgeon: [name], MD;  Location: ;  Service: Thoracic;  Laterality: Left;  LEFT ANTERIOR MEDIASTINOTOMY     There were no vitals filed for this visit.  Visit Diagnosis:  Lymphedema  Muscle stiffness  Other chest pain      Subjective Assessment - 09/01/14 1024    Subjective Having more pain on the left flank today.  Went off the prilosec.  Lymphedema is bad at right breast today.  Weather affects it.   Currently in Pain? Yes   Pain Score 5    Pain Location Flank   Pain Orientation Left   Aggravating Factors  massage that is a little firm there   Pain Relieving Factors motrin                         [facility] Adult PT Treatment/Exercise - 09/01/14 0001    Manual Therapy   Manual Lymphatic  Drainage (MLD) In left sidelying, posterior interaxillary anastomosis and right axillo-inguinal anastomosis; in supine, short neck, left axilla and anterior interaxillary anastomosis, right groin and axillo-inguinal anastomosis, area between right breast scars, directing toward pathways.  In right sidelying, left periscapular area toward left groin.   Manual Therapy Other (comment)  In Rt. sidelying, soft tissue work to left upper back, neck    Manual Therapy   Myofascial Release At right axillary scar with soft tissue mobilization and myofascial release.                        [facility] Goals - 08/29/14 1503    CC Long Term Goal  #1   Baseline Doing better with this currently (08/29/14), now a few weeks out from most recent radiation treatment; goal is ongoing.   Status On-going   CC Long Term Goal  #2   Baseline Swelling has been controlled adequately with 2x/week therapy; goal is ongoing.   Status On-going            Plan - 09/01/14 1201    Clinical Impression Statement Continues to have immediate and significant relief from therapy sessions.   Pt will benefit from skilled therapeutic intervention in order to improve on the  following deficits Increased edema;Pain;Increased fascial restricitons   Rehab Potential Good   PT Treatment/Interventions Manual lymph drainage;Manual techniques   PT Next Visit Plan Manual lymph drainage, myofascial release, soft tissue work.   PT Plan Continue manual lymph drainage, soft tissue work, and myofascial release for pain and edema control.        Problem List Patient Active Problem List   Diagnosis Date Noted  . 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 Chest wall pain 03/21/2014  . Abnormal LFTs (liver function tests) 09/12/2013  . Pleural effusion 08/18/2013  . 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 Secondary malignant neoplasm of mediastinal lymph node 08/18/2013     [name],[name] 09/01/2014, 12:03 PM  [place], [place], [address] Phone: [phone]   Fax:  [place], PT 09/01/2014 12:03 PM

## 2014-09-04 ENCOUNTER — Encounter (HOSPITAL_COMMUNITY): Payer: Self-pay

## 2014-09-04 ENCOUNTER — Encounter: Payer: Self-pay | Admitting: Radiation Oncology

## 2014-09-04 ENCOUNTER — Ambulatory Visit: Payer: 59 | Admitting: Physical Therapy

## 2014-09-04 ENCOUNTER — Other Ambulatory Visit: Payer: Self-pay | Admitting: Oncology

## 2014-09-04 ENCOUNTER — Ambulatory Visit (HOSPITAL_BASED_OUTPATIENT_CLINIC_OR_DEPARTMENT_OTHER): Payer: 59

## 2014-09-04 ENCOUNTER — Ambulatory Visit (HOSPITAL_COMMUNITY)
Admission: RE | Admit: 2014-09-04 | Discharge: 2014-09-04 | Disposition: A | Discharge status: Home / Self Care | Payer: 59 | Source: Ambulatory Visit | Attending: Oncology | Admitting: Oncology

## 2014-09-04 ENCOUNTER — Other Ambulatory Visit (HOSPITAL_BASED_OUTPATIENT_CLINIC_OR_DEPARTMENT_OTHER): Payer: 59

## 2014-09-04 DIAGNOSIS — R7989 Other specified abnormal findings of blood chemistry: Secondary | ICD-10-CM

## 2014-09-04 DIAGNOSIS — J90 Pleural effusion, not elsewhere classified: Secondary | ICD-10-CM

## 2014-09-04 DIAGNOSIS — C7989 Secondary malignant neoplasm of other specified sites: Secondary | ICD-10-CM | POA: Diagnosis not present

## 2014-09-04 DIAGNOSIS — C50919 Malignant neoplasm of unspecified site of unspecified female breast: Secondary | ICD-10-CM | POA: Insufficient documentation

## 2014-09-04 DIAGNOSIS — I890 Lymphedema, not elsewhere classified: Secondary | ICD-10-CM | POA: Diagnosis not present

## 2014-09-04 DIAGNOSIS — C50211 Malignant neoplasm of upper-inner quadrant of right female breast: Secondary | ICD-10-CM | POA: Diagnosis not present

## 2014-09-04 DIAGNOSIS — R0789 Other chest pain: Secondary | ICD-10-CM

## 2014-09-04 DIAGNOSIS — R945 Abnormal results of liver function studies: Secondary | ICD-10-CM

## 2014-09-04 DIAGNOSIS — M6289 Other specified disorders of muscle: Secondary | ICD-10-CM

## 2014-09-04 HISTORY — DX: Secondary malignant neoplasm of bone: C79.51

## 2014-09-04 LAB — COMPREHENSIVE METABOLIC PANEL (CC13)
ALBUMIN: 3.6 g/dL (ref 3.5–5.0)
ALK PHOS: 86 U/L (ref 40–150)
ALT: 47 U/L (ref 0–55)
AST: 27 U/L (ref 5–34)
Anion Gap: 9 mEq/L (ref 3–11)
BUN: 12.1 mg/dL (ref 7.0–26.0)
CO2: 26 mEq/L (ref 22–29)
CREATININE: 0.8 mg/dL (ref 0.6–1.1)
Calcium: 9.5 mg/dL (ref 8.4–10.4)
Chloride: 104 mEq/L (ref 98–109)
EGFR: 86 mL/min/{1.73_m2} — AB (ref >90)
GLUCOSE: 87 mg/dL (ref 70–140)
Potassium: 3.8 mEq/L (ref 3.5–5.1)
Sodium: 139 mEq/L (ref 136–145)
Total Bilirubin: 0.27 mg/dL (ref 0.20–1.20)
Total Protein: 7.1 g/dL (ref 6.4–8.3)

## 2014-09-04 LAB — CBC WITH DIFFERENTIAL/PLATELET
BASO%: 1 % (ref 0.0–2.0)
Basophils Absolute: 0.1 10*3/uL (ref 0.0–0.1)
EOS%: 2.5 % (ref 0.0–7.0)
Eosinophils Absolute: 0.1 10*3/uL (ref 0.0–0.5)
HEMATOCRIT: 39.7 % (ref 34.8–46.6)
HGB: 13.5 g/dL (ref 11.6–15.9)
LYMPH%: 9.4 % — ABNORMAL LOW (ref 14.0–49.7)
MCH: 31.8 pg (ref 25.1–34.0)
MCHC: 34.1 g/dL (ref 31.5–36.0)
MCV: 93.3 fL (ref 79.5–101.0)
MONO#: 0.5 10*3/uL (ref 0.1–0.9)
MONO%: 8.8 % (ref 0.0–14.0)
NEUT%: 78.3 % — ABNORMAL HIGH (ref 38.4–76.8)
NEUTROS ABS: 4.3 10*3/uL (ref 1.5–6.5)
Platelets: 378 10*3/uL (ref 145–400)
RBC: 4.25 10*6/uL (ref 3.70–5.45)
RDW: 13 % (ref 11.2–14.5)
WBC: 5.5 10*3/uL (ref 3.9–10.3)
lymph#: 0.5 10*3/uL — ABNORMAL LOW (ref 0.9–3.3)

## 2014-09-04 MED ORDER — IOHEXOL 300 MG/ML  SOLN
100.0000 mL | Freq: Once | INTRAMUSCULAR | Status: AC | PRN
  Administered 2014-09-04: 80 mL via INTRAVENOUS

## 2014-09-04 NOTE — Therapy (Signed)
[place], [place], [address] Phone: [phone]   Fax:  [phone]  Physical Therapy Treatment  Patient Details  Name: [name] [name] MRN: [id number] Date of Birth: [birth year] Referring Provider:  Consuela Mimes, MD  Encounter Date: 09/04/2014      PT End of Session - 09/04/14 1241    Visit Number 48   Number of Visits 47   Date for PT Re-Evaluation 09/08/14   PT Start Time 0093   PT Stop Time 1103   PT Time Calculation (min) 40 min   Activity Tolerance Patient tolerated treatment well   Behavior During Therapy [facility] for tasks assessed/performed      Past Medical History  Diagnosis Date  . Seizures 2010    Isolated incident.  . Breast cancer dx'd 2005/2011  . PONV (postoperative nausea and vomiting)   . Peripheral vascular disease 02/2010    blood clot related to porta cath    Past Surgical History  Procedure Laterality Date  . Breast lumpectomy  2005  . Axillary lymph node dissection  Dec. 2011  . Portacath placement  12/11  . Removal portacath    . Mediastinotomy chamberlain mcneil Left 06/02/2013    Procedure: MEDIASTINOTOMY CHAMBERLAIN MCNEIL;  Surgeon: [name], MD;  Location: [place];  Service: Thoracic;  Laterality: Left;  LEFT ANTERIOR MEDIASTINOTOMY     There were no vitals filed for this visit.  Visit Diagnosis:  Lymphedema  Muscle stiffness  Other chest pain      Subjective Assessment - 09/04/14 1025    Subjective Banged her toe and it turned purple.   Currently in Pain? Yes   Pain Score 3    Pain Location Flank  + right lateral breast   Pain Orientation Left                         [facility] Adult PT Treatment/Exercise - 09/04/14 0001    Manual Therapy   Manual Lymphatic Drainage (MLD) (p) In left sidelying, posterior interaxillary anastomosis and right axillo-inguinal anastomosis; in supine, short neck, left axilla and anterior  interaxillary anastomosis, right groin and axillo-inguinal anastomosis, area between right breast scars, directing toward pathways.  In right sidelying, left periscapular area toward left groin.   Manual Therapy (p) Other (comment)  In Rt. sidelying, soft tissue work to left upper back, neck    Manual Therapy   Myofascial Release (p) At right axillary scar with soft tissue mobilization and myofascial release.                        [facility] Goals - 08/29/14 1503    CC Long Term Goal  #1   Baseline Doing better with this currently (08/29/14), now a few weeks out from most recent radiation treatment; goal is ongoing.   Status On-going   CC Long Term Goal  #2   Baseline Swelling has been controlled adequately with 2x/week therapy; goal is ongoing.   Status On-going            Plan - 09/04/14 1242    Clinical Impression Statement patient doing well today; fullness noted by pt. and therapist at right lateral breast incisions   Pt will benefit from skilled therapeutic intervention in order to improve on the following deficits Increased edema;Pain;Increased fascial restricitons   PT Treatment/Interventions Manual lymph drainage;Manual techniques   PT Next Visit Plan Manual lymph drainage,  myofascial release, soft tissue work.        Problem List Patient Active Problem List   Diagnosis Date Noted  . 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 Chest wall pain 03/21/2014  . Abnormal LFTs (liver function tests) 09/12/2013  . Pleural effusion 08/18/2013  . 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 Secondary malignant neoplasm of mediastinal lymph node 08/18/2013    [name],[name] 09/04/2014, 12:43 PM  [place], [place], [address] Phone: [phone]   Fax:  [place], PT 09/04/2014 12:44 PM

## 2014-09-04 NOTE — Progress Notes (Signed)
Pt in for IV access only. Scheduled for CT today. Lab drawn by phlebotomists.

## 2014-09-05 ENCOUNTER — Ambulatory Visit (HOSPITAL_BASED_OUTPATIENT_CLINIC_OR_DEPARTMENT_OTHER): Payer: 59 | Admitting: Oncology

## 2014-09-05 ENCOUNTER — Ambulatory Visit
Admission: RE | Admit: 2014-09-05 | Discharge: 2014-09-05 | Disposition: A | Discharge status: Home / Self Care | Payer: 59 | Source: Ambulatory Visit | Attending: Radiation Oncology | Admitting: Radiation Oncology

## 2014-09-05 DIAGNOSIS — Z86718 Personal history of other venous thrombosis and embolism: Secondary | ICD-10-CM

## 2014-09-05 DIAGNOSIS — K760 Fatty (change of) liver, not elsewhere classified: Secondary | ICD-10-CM

## 2014-09-05 DIAGNOSIS — C50412 Malignant neoplasm of upper-outer quadrant of left female breast: Secondary | ICD-10-CM

## 2014-09-05 DIAGNOSIS — R7989 Other specified abnormal findings of blood chemistry: Secondary | ICD-10-CM

## 2014-09-05 DIAGNOSIS — C771 Secondary and unspecified malignant neoplasm of intrathoracic lymph nodes: Secondary | ICD-10-CM

## 2014-09-05 DIAGNOSIS — C50211 Malignant neoplasm of upper-inner quadrant of right female breast: Secondary | ICD-10-CM

## 2014-09-05 DIAGNOSIS — C778 Secondary and unspecified malignant neoplasm of lymph nodes of multiple regions: Secondary | ICD-10-CM

## 2014-09-05 DIAGNOSIS — R945 Abnormal results of liver function studies: Secondary | ICD-10-CM

## 2014-09-05 DIAGNOSIS — M8580 Other specified disorders of bone density and structure, unspecified site: Secondary | ICD-10-CM

## 2014-09-05 DIAGNOSIS — I890 Lymphedema, not elsewhere classified: Secondary | ICD-10-CM | POA: Diagnosis not present

## 2014-09-05 DIAGNOSIS — J90 Pleural effusion, not elsewhere classified: Secondary | ICD-10-CM

## 2014-09-05 DIAGNOSIS — C782 Secondary malignant neoplasm of pleura: Secondary | ICD-10-CM | POA: Diagnosis not present

## 2014-09-05 DIAGNOSIS — Z170 Estrogen receptor positive status [ER+]: Secondary | ICD-10-CM

## 2014-09-05 HISTORY — DX: Personal history of irradiation: Z92.3

## 2014-09-05 NOTE — Progress Notes (Signed)
Mrs [name] [name]  Reports decrease in  Esophagitis and continues to eat plain, non spicy foods.  Continues to have fatigue.   She refused to weigh today and refused to have vitals documented because she feels all vitals are erroneous since her BP is always elevated during her clinic visits.

## 2014-09-05 NOTE — Progress Notes (Signed)
[place]  [phone] [phone]     ID: [name] [name] OB: 1961-01-19  MR#: [id number]  MPN#:[id number]  PCP: Pcp Not In System GYN:  [name] SU:  OTHER MD: [name] [name], [name], [name] [name], [name] [name], [name] [name]  CHIEF COMPLAINT: Stage IV breast cancer  CURRENT TREATMENT: exemestane  BREAST CANCER HISTORY: From doctor [name] [name]'s intake note 03/20/2004:  "The patient is a very pleasant 54 year old female, without significant past medical history.  Her family history is significant for a sister who at age 34 was diagnosed with invasive ductal carcinoma.  She is a breast cancer survivor at age 52 now.  The patient states that she has never really had a screening mammogram until October 2005, when she felt that it was time for her to start having mammograms done on a yearly basis.  Therefore, on 01/26/04, she underwent a screening mammogram and an abnormality was detected in the upper outer right breast.  She, therefore, underwent spot compression views of both the right and the left breast.  The left breast revealed a well-defined mass in the upper outer left quadrant, present at the 2 o'clock position, measuring 1.8 cm, 6 cm from the nipple.  This, by ultrasound, was felt to be a simple cyst measuring 1.8 cm.  On the right breast, a spiculated mass was noted in the upper outer right quadrant.  The ultrasound revealed a shadowing irregular solid mass at the 10:30 position, 9 cm from the nipple, measuring 1.2 cm in greatest dimension, correlating with the spiculated mass seen on the mammogram.  The right axilla was negative ultrasonically.  Because of this, the patient underwent a needle biopsy of the right breast and the biopsy was positive invasive mammary carcinoma that showed features consistent with a high-grade invasive ductal carcinoma associated with desmoplastic stroma.  No in situ  component was seen and no definite lymphovascular invasion was identified.  On the core biopsy, the tumor measured about 0.8 cm.  Because of this, she was seen by Dr. [name] and the patient was taken to the [facility] on March 15, 2004.  She underwent a right breast lumpectomy with sentinel node biopsy.  The final pathology revealed an invasive ductal carcinoma, measuring 1.7 cm, grade 2 of 3.  Margins were free of tumor.  Atypical lobular hyperplasia was noted.  One sentinel node was removed which was negative for metastatic disease.  The tumor was staged at T1c, N0 MX.  It was estrogen receptor positive, progesterone receptor positive.  HER-2/neu was 2+.  FISH was negative.  All margins were free of tumor.  She is now seen in Medical Oncology for further evaluation and management of this newly diagnosed T1c, node negative, stage I, invasive ductal carcinoma of the right breast."  Her subsequent history is as detailed below  INTERVAL HISTORY: [name] [name] returns today for followup of her stage IV breast cancer accompanied by her husband [name] [name]. After her course of radiation under Dr. [name] developed significant esophagitis with a poorly controlled pain, and difficulty keeping herself well nourished and hydrated. She has thankfully recovered from that. She continues on exemestane. She has been very reluctant to go on Palbociclib as well. She reviewed a T-cell study out of [facility] but decided against it. She is wondering what our next step should be given the results of the CT scan she just had 09/04/2014, which shows a nice response in the irradiated  areas but otherwise slow but relentless growth  REVIEW OF SYSTEMS: Atheena feels the area in the left upper chest wall which was swollen and painful is now "much better". She does not want to go on zolendronate at present because she is afraid of side effects, not so much the osteonecrosis issue but the bony aches and more likely  can occur. She was surprised that the amount of radiation problem she had with her esophagus. She tells me when she had radiation at [facility] which she received with Zometa she did not have those problems. Of course was a different target. She is planning a trip to [place] between July 22 and July 31. She would need to have her left Pleurx drained on July 25 and July 28 since she would need some lymph drainage as well. Aside from these issues her breathing is "pretty good" and she continues to drain about 220- 250 mL twice a week, with no complications. Currently she has no difficulty swallowing. A detailed review of systems was otherwise stable  PAST MEDICAL HISTORY: Past Medical History  Diagnosis Date  . Seizures 2010    Isolated incident.  Marland Kitchen PONV (postoperative nausea and vomiting)   . Peripheral vascular disease 02/2010    blood clot related to porta cath  . Breast cancer dx'd 2005/2011  . Bone metastases dx'd 05/2014  . S/P radiation therapy 07/17/2014 through 08/02/2014     Left mediastinum, left seventh rib 3250 cGy in 13 sessions     PAST SURGICAL HISTORY: Past Surgical History  Procedure Laterality Date  . Breast lumpectomy  2005  . Axillary lymph node dissection  Dec. 2011  . Portacath placement  12/11  . Removal portacath    . Mediastinotomy chamberlain mcneil Left 06/02/2013    Procedure: MEDIASTINOTOMY CHAMBERLAIN MCNEIL;  Surgeon: [name], MD;  Location[facility] OR;  Service: Thoracic;  Laterality: Left;  LEFT ANTERIOR MEDIASTINOTOMY     FAMILY HISTORY Family History  Problem Relation Age of Onset  . COPD Mother   . Breast cancer Sister 1   The patient's father is living, 44 years old as of may 2015. He lives in [place]. The patient's mother died from complications of COPD at the age of 87. These has 2 brothers, one sister. Her sister developed breast cancer at the age of 40. She is  doing well. The patient herself underwent genetic testing at [facility] in 2011 and was found to be BRCA negative  GYNECOLOGIC HISTORY:  Menarche age 23, she is GX P0. She stopped having periods with her initial chemotherapy in 2006.  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 Her husband [name] [name] is a Medical illustrator in [place]. He has a child from a prior marriage. At home they have 2 rescue dogs, Hobo and [place]ulah. The patient is religious but not a church attender    ADVANCED DIRECTIVES: In place; at the 08/04/2014 visit in particular the patient was very clear, with her husband present, that she would not want any kind of feeding tubes or "other tubes" if her condition deteriorated.   HEALTH MAINTENANCE: History  Substance Use Topics  . Smoking status: Never Smoker   . Smokeless tobacco: Never Used  . Alcohol Use: No     Colonoscopy:  PAP:  Bone density: March 2015; mild osteopenia  Lipid panel:  Allergies  Allergen Reactions  . Decadron [Dexamethasone] Other (See Comments)  Patient does not tolerate steroids.   . Enoxaparin Other (See Comments)    unknown  . Fluconazole Swelling  . Hydromorphone Hcl Nausea And Vomiting  . Morphine And Related   . Tegaderm Ag Mesh [Silver]     Current Outpatient Prescriptions  Medication Sig Dispense Refill  . ALPRAZolam (XANAX) 0.5 MG tablet Take 1 tablet (0.5 mg total) by mouth 2 (two) times daily as needed for anxiety. 30 tablet 0  . b complex vitamins capsule Take 1 capsule by mouth daily.    . Calcium-Magnesium-Vitamin D (CITRACAL CALCIUM+D PO) Take by mouth. 462m vitamin C, 5053mvitamin D3 2 PO DAILY    . cholecalciferol (VITAMIN D) 1000 UNITS tablet Take 1,000 Units by mouth daily. Takes 2,000 Iu daily    . exemestane (AROMASIN) 25 MG tablet Take 25 mg by mouth daily after breakfast.   4  . folic acid (FOLVITE) 1 MG tablet Take 1 mg by mouth daily.    . Marland Kitchenibuprofen (ADVIL,MOTRIN) 200 MG tablet Take 200 mg by mouth every 4 (four) hours as needed.    . Melatonin 1 MG TABS Take 3 mg by mouth at bedtime as needed (sleep).     . Marland KitchenxyCODONE (OXY IR/ROXICODONE) 5 MG immediate release tablet Take 1 tablet (5 mg total) by mouth every 4 (four) hours as needed for severe pain. 30 tablet 0  . palbociclib (IBRANCE) 75 MG capsule Take 1 capsule (75 mg total) by mouth daily with breakfast. Take whole with food. (Patient not taking: Reported on 08/10/2014) 11 capsule 4  . [DISCONTINUED] metoCLOPramide (REGLAN) 10 MG tablet Take 1 tablet (10 mg total) by mouth 4 (four) times daily -  before meals and at bedtime. 60 tablet 3   No current facility-administered medications for this visit.    OBJECTIVE: Middle-aged white [name] who refused vitals and weights today There were no vitals filed for this visit.   There is no weight on file to calculate BMI.   There were no vitals filed for this visit.  Patient refused vitals    ECOG FS:1 - Symptomatic but completely ambulatory  Sclerae unicteric, EOMs intact Oropharynx clear, dentition in good repair No cervical or supraclavicular adenopathy Lungs no rales or rhonchi Heart regular rate and rhythm Abd soft, nontender, positive bowel sounds MSK no focal spinal tenderness, the area in the upper left anterior chest that was previously swollen is now almost flat Neuro: nonfocal, well oriented, appropriate affect Breasts: Deferred   LAB RESULTS:   CMP     Component Value Date/Time   NA 139 09/04/2014 1318   NA 134* 09/03/2013 2145   K 3.8 09/04/2014 1318   K 3.7 09/03/2013 2145   CL 97 09/03/2013 2145   CL 105 05/06/2012 1333   CO2 26 09/04/2014 1318   CO2 23 09/03/2013 2145   GLUCOSE 87 09/04/2014 1318   GLUCOSE 121* 09/03/2013 2145   GLUCOSE 124* 05/06/2012 1333   BUN 12.1 09/04/2014 1318   BUN 8 09/03/2013 2145   CREATININE 0.8 09/04/2014 1318   CREATININE 0.64 09/03/2013 2145   CALCIUM 9.5 09/04/2014  1318   CALCIUM 9.0 09/03/2013 2145   PROT 7.1 09/04/2014 1318   PROT 6.8 09/03/2013 2145   ALBUMIN 3.6 09/04/2014 1318   ALBUMIN 3.3* 09/03/2013 2145   AST 27 09/04/2014 1318   AST 55* 09/03/2013 2145   ALT 47 09/04/2014 1318   ALT 85* 09/03/2013 2145   ALKPHOS 86 09/04/2014 1318   ALKPHOS 114 09/03/2013 2145  BILITOT 0.27 09/04/2014 1318   BILITOT 0.3 09/03/2013 2145   GFRNONAA >90 09/03/2013 2145   GFRAA >90 09/03/2013 2145    No results found for: SPEP  Lab Results  Component Value Date   WBC 5.5 09/04/2014   NEUTROABS 4.3 09/04/2014   HGB 13.5 09/04/2014   HCT 39.7 09/04/2014   MCV 93.3 09/04/2014   PLT 378 09/04/2014      Chemistry      Component Value Date/Time   NA 139 09/04/2014 1318   NA 134* 09/03/2013 2145   K 3.8 09/04/2014 1318   K 3.7 09/03/2013 2145   CL 97 09/03/2013 2145   CL 105 05/06/2012 1333   CO2 26 09/04/2014 1318   CO2 23 09/03/2013 2145   BUN 12.1 09/04/2014 1318   BUN 8 09/03/2013 2145   CREATININE 0.8 09/04/2014 1318   CREATININE 0.64 09/03/2013 2145      Component Value Date/Time   CALCIUM 9.5 09/04/2014 1318   CALCIUM 9.0 09/03/2013 2145   ALKPHOS 86 09/04/2014 1318   ALKPHOS 114 09/03/2013 2145   AST 27 09/04/2014 1318   AST 55* 09/03/2013 2145   ALT 47 09/04/2014 1318   ALT 85* 09/03/2013 2145   BILITOT 0.27 09/04/2014 1318   BILITOT 0.3 09/03/2013 2145       Lab Results  Component Value Date   LABCA2 25 11/02/2007    No components found for: [id number]  No results for input(s): INR in the last 168 hours.  Urinalysis    Component Value Date/Time   COLORURINE YELLOW 09/03/2013 2109   APPEARANCEUR CLEAR 09/03/2013 2109   LABSPEC 1.005 09/12/2013 1542   LABSPEC 1.016 09/03/2013 2109   PHURINE 6.0 09/12/2013 1542   PHURINE 6.0 09/03/2013 2109   GLUCOSEU Negative 09/12/2013 [place] 09/03/2013 2109   HGBUR Negative 09/12/2013 [place] 09/03/2013 2109   BILIRUBINUR Negative  09/12/2013 [place] 09/03/2013 2109   KETONESUR Negative 09/12/2013 [place] 09/03/2013 2109   PROTEINUR Negative 09/12/2013 1542   PROTEINUR NEGATIVE 09/03/2013 2109   UROBILINOGEN 0.2 09/12/2013 1542   UROBILINOGEN 0.2 09/03/2013 2109   NITRITE Negative 09/12/2013 1542   NITRITE NEGATIVE 09/03/2013 2109   LEUKOCYTESUR Negative 09/12/2013 1542   LEUKOCYTESUR NEGATIVE 09/03/2013 2109    STUDIES: Ct Chest W Contrast  09/04/2014   CLINICAL DATA:  Metastatic breast cancer. Subsequent treatment evaluation. Chest wall metastasis. Normal  EXAM: CT CHEST WITH CONTRAST  TECHNIQUE: Multidetector CT imaging of the chest was performed during intravenous contrast administration.  CONTRAST:  31m OMNIPAQUE IOHEXOL 300 MG/ML  SOLN  COMPARISON:  CT 06/29/2014 PET-CT 03/02/2014  FINDINGS: Mediastinum/Nodes: Lymphadenectomy clips noted in the axilla. No supraclavicular lymphadenopathy. There is soft tissue nodularity in the left supraclavicular location measuring 14 mm (image 13, series 2) which is not changed from prior. Subcarinal lymph node measures 11 mm compared to 15 mm on prior.  Lungs/Pleura: A thickened left upper lobe paramediastinal mass measures 29 mm x 72 mm compared to 34 mm x 69 mm. Lesion appears somewhat reduced in thickness. Anteriorly in the left upper lobe nodule pleural thickening measures 11 mm compared to 15 mm.  There is a rind of metabolic activity in the lower pleural space on the left measuring 26 mm in thickness which is increased from 22 mm on prior.  There is a moderate pleural effusion on the left similar prior. Dependent atelectasis of the left lower lobe. There  is slight decrease in the nodular thickening along the left oblique fissure. Pulmonary nodule measures 11 mm at the lung bases decrease 14 mm. Right lung is clear.  Left chest tube remains in place. There is a a potential fracture or coarsened to persists skeletal metastasis on images 38,  series 2.  Upper abdomen: Intention of the left pleural process into the left perirenal space adjacent to the aorta. This lesion measures 12 mm along the left crus of the diaphragm on image 66, series 2 which is similar to comparison exam. No focal hepatic lesion limited view of liver. The adrenal glands cells appear normal. Review of the skeleton demonstrates no aggressive osseous lesion.  Musculoskeletal:  IMPRESSION: 1. Mixed response to  therapy. 2. The paramediastinal left upper lobe mass appears slightly reduced in thickness compared to prior. Bulky disease remains. 3. The pleural nodular metastasis in the inferior posterior left lower lobe is slightly thickened compared to prior. 4. Parenchymal pleural nodular densities in left lower lobe is slightly reduced. 5. Extension into the retroperitoneal space along the left crus of diaphragm is similar to prior. 6. Subcarinal lymph nodes is slightly reduced in volume.   Electronically Signed   By: Suzy Bouchard M.D.   On: 09/04/2014 16:36    ASSESSMENT: 54 y.o. [name] [name] with stage IV breast cancer, history as follows  (1)  S/p Right lumpectomy and sentinel lymph node sampling 03/15/2004 for a pT1c pN0. Stage IA invasive ductal carcinoma, grade 2, estrogen receptor 95% positive, progesterone receptor 65% positive, HER-2 not amplified; additional surgery 04/25/2004 for seroma or clearance showed no residual tumor  (2) adjuvant chemotherapy with cyclophosphamide and doxorubicin every 21 days x4 completed 07/19/2004  (3) adjuvant radiation given under Dr. Donella Stade in [place] completed July 2006  (4) the patient opted against adjuvant antiestrogen therapy  (5) genetics testing showed no BRCA mutations  (6) biopsy of a palpable right axillary mass 10/24/2009 showed invasive ductal carcinoma, grade 3, estrogen receptor 100% positive, progesterone receptor 2% positive (alert score 5) HER-2 negative; no evidence of systemic disease on PET  scanning  (7) completed 3 of 4 planned cycles of docetaxel and cyclophosphamide September 2011, fourth cycle omitted because of marked elevations in liver function tests  (8) an right axillary lymph node dissection 03/06/2010 showed 3/8 lymph nodes removed to be involved by tumor, with extracapsular extension.  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, TTS-1 negative), estrogen receptor 80% positive, progesterone receptor 2% positive, HER-2 not amplified  (12) letrozole started March 2015-- tolerated with significant side effects, discontinued at the end of May 2015  (13) PET scan 08/16/2013 shows extensive left pleural metastatic disease and a large left pleural effusion that shifts cardiac and mediastinal structures to the right; adenopathy (celiac trunk, periadrenal, periaortic); and a left medial clavicular lesion; Status post left thoracentesis 08/16/2013 positive for adenocarcinoma, estrogen receptor positive, progesterone receptor negative.  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 [PIK3CA H8527P, a complex rearrangement in PIK3R2, and amplification ofPIK3C2B]],  amplification of MCL1 and MDM4, anda MAP2K4 R287H mutation; everolimus was suggested as an available targeted agent  (18) exemestane started  03/31/2014  (a) everolimus added 04/03/2014 but not tolerated (cytopenias, elevated LFTs) even at minimal doses; stopped 04/17/2014    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 treated with Lovenox for 5-6 months  (c) right upper extremity lymphedema  (d) hepatic steatosis with chronically elevated LFTs as well as unusual hepatic sensitivity to chemotherapy  (e) osteopenia with the lowest T score -1.6 on bone density scan 06/20/2013  (f) radiation oncology (Dr Valere Dross) has reviewed prior radiation records in case there is further mediastinal involvement with dysphagia etc in which case palliative XRT could be considered  (a) radiation to left mediastinum/ left 7th rib 3250 cGy in 13 sessions04/18/2016 through 08/02/2014   PLAN: Maisha had a rough time with her radiation, but she is responding to it. Really nothing much else has caused any kind of shrinkage of this tumor, and the part that were not irradiated have continued to grow, although very slowly.  I think it is worth asking whether irradiating the rest of the tumor bed in the upper left lung might not be worth it. Technically this may or may not be feasible and we will have to discuss it with Dr. Valere Dross. Certainly it seems more feasible than proceeding to F kind of decortication procedure although Saraia has not given up on that idea and she will be seeing Dr. [name] within the week.  She is planning a trip to [place] in July and we will try to set her up with an oncologist locally so she can have her pleurx drainage procedures twice a week and if possible also to lymphedema treatments at the same time. Since her sister lives in that area as she would then have a foot hold so if she is visiting her sister and has a problem she could see in oncologist there who knows her area did  She will return the first week  in August. August 1 she will have a PET scan in August 2 she will have a brain MRI. Assuming those results show no distant disease, namely outside the left lung area, then the possibility of radiation can be discussed.  If she is to have radiation to that area, we would stop the exemestane and give her sensitizing capecitabine during the treatment, after which we would observe at least for a time on no treatment.  If radiation is not possible we can consider continuing the exemestane but adding Palbociclib, or even preferably switching to fulvestrant with Palbociclib.  Sofiya has a good understanding of this plan. She agrees with it. She knows the goal of treatment in her case is control. She will call with any problems that may develop before her next visit here. Chauncey Cruel, MD   09/05/2014 2:13 PM

## 2014-09-05 NOTE — Progress Notes (Signed)
CC: Dr. Gunnar Bulla Magrinat  Follow-up note:  Ms. Ekdahl visits today approximately 1 month following completion of palliative radiotherapy to her left chest/mediastinum and left seventh rib.  Following treatment she developed esophagitis which has resolved.  She also had a cough which improved with Tessalon Perles.  She has discontinued Prilosec and the [employer] and no longer has dysphagia or significant cough.  She does have mild fatigue.  She declines to be weighed today.  She had a follow-up CAT scan through Dr. Dr. Jana Hakim yesterday and this showed a "mixed response" to therapy.  The areas irradiated along the left paramediastinal and subcarinal regions are slightly reduced in size.  The unirradiated lateral pleural space on the left is somewhat thicker.  Unirradiated pleural nodular densities in the left lower lobe are slightly reduced perhaps indicating a response to endocrine therapy.  She is no longer having any anterior chest discomfort.  Physical examination: Alert and oriented.  Chest: There is slight erythema along the anterior left chest.  The previously noted left parasternal prominence is no longer appreciated.  There are diminished breath sounds along the left lower lung zone.  Impression: Mrs. Darrington appears to have had some regression of disease just 1 month following palliative radiotherapy.  I told her that maximum response to radiation therapy can often take 3 months or more particularly in slow-growing tumors.  She may be responding to her endocrine therapy as well.  Plan: Follow-up through Dr. Dr. Jana Hakim with continuation of antiestrogen therapy.  I've not scheduled the patient for a formal follow-up visit but I would more than happy to see her again should the need arise.

## 2014-09-07 ENCOUNTER — Telehealth: Payer: Self-pay | Admitting: *Deleted

## 2014-09-07 NOTE — Telephone Encounter (Signed)
INFORMED PT. THAT DR.MAGRINAT'S NURSE, VAL DODD,RN. IS NOT WORKING TODAY. PT. ASKED FOR A RETURN CALL TOMORROW. WILL NOTIFY VAL DODD,RN. TOMORROW MORNING.

## 2014-09-08 ENCOUNTER — Ambulatory Visit: Payer: 59 | Admitting: Physical Therapy

## 2014-09-08 DIAGNOSIS — I890 Lymphedema, not elsewhere classified: Secondary | ICD-10-CM | POA: Diagnosis not present

## 2014-09-08 DIAGNOSIS — R0789 Other chest pain: Secondary | ICD-10-CM

## 2014-09-08 DIAGNOSIS — M6289 Other specified disorders of muscle: Secondary | ICD-10-CM

## 2014-09-08 NOTE — Therapy (Signed)
[place], [place], [address] Phone: [phone]   Fax:  [phone]  Physical Therapy Treatment  Patient Details  Name: [name] [name] MRN: [id number] Date of Birth: 1960-08-15 Referring Provider:  Chauncey Cruel, MD  Encounter Date: 09/08/2014      PT End of Session - 09/08/14 1243    Visit Number 47   Number of Visits 41   Date for PT Re-Evaluation 12/01/14   PT Start Time 1108   PT Stop Time 1159   PT Time Calculation (min) 51 min   Activity Tolerance Patient tolerated treatment well   Behavior During Therapy [facility] for tasks assessed/performed      Past Medical History  Diagnosis Date  . Seizures 2010    Isolated incident.  Marland Kitchen PONV (postoperative nausea and vomiting)   . Peripheral vascular disease 02/2010    blood clot related to porta cath  . Breast cancer dx'd 2005/2011  . Bone metastases dx'd 05/2014  . S/P radiation therapy 07/17/2014 through 08/02/2014     Left mediastinum, left seventh rib 3250 cGy in 13 sessions     Past Surgical History  Procedure Laterality Date  . Breast lumpectomy  2005  . Axillary lymph node dissection  Dec. 2011  . Portacath placement  12/11  . Removal portacath    . Mediastinotomy chamberlain mcneil Left 06/02/2013    Procedure: MEDIASTINOTOMY CHAMBERLAIN MCNEIL;  Surgeon: [name], MD;  Location: [facility];  Service: Thoracic;  Laterality: Left;  LEFT ANTERIOR MEDIASTINOTOMY     There were no vitals filed for this visit.  Visit Diagnosis:  Lymphedema - Plan: PT plan of care cert/re-cert  Muscle stiffness - Plan: PT plan of care cert/re-cert  Other chest pain - Plan: PT plan of care cert/re-cert      Subjective Assessment - 09/08/14 1117    Pain Score 4   3 at left flank   Pain Location Axilla   Pain Orientation Right   Pain Descriptors / Indicators  Other (Comment)  toe neuropathy getting worse (numbness)                         [facility] Adult PT Treatment/Exercise - 09/08/14 0001    Manual Therapy   Manual Lymphatic Drainage (MLD) In left sidelying, posterior interaxillary anastomosis and right axillo-inguinal anastomosis; in supine, short neck, left axilla and anterior interaxillary anastomosis, right groin and axillo-inguinal anastomosis, area between right breast scars, directing toward pathways.  In right sidelying, left periscapular area toward left groin.   Manual Therapy Other (comment)  In Rt. sidelying, soft tissue work to left upper back, neck    Manual Therapy   Myofascial Release At right axillary scar with soft tissue mobilization and myofascial release.                        [facility] Goals - 09/08/14 1247    CC Long Term Goal  #1   Baseline Goal is currently being met and is ongoing.   Status On-going   CC Long Term Goal  #2   Baseline Goal is currently being met and is ongoing.            Plan - 09/08/14 1245    Clinical Impression Statement Pt. with recent CT scan showing some progression of disease and some positive result of recent radiation treatment.  Therapy continues to help keep pain  and swelling at tolerable levels.   Pt will benefit from skilled therapeutic intervention in order to improve on the following deficits Increased edema;Pain;Increased fascial restricitons   Rehab Potential Good   Clinical Impairments Affecting Rehab Potential active cancer   PT Frequency 2x / week   PT Duration 12 weeks   PT Treatment/Interventions Manual lymph drainage;Manual techniques   PT Next Visit Plan Manual lymph drainage, myofascial release, soft tissue work.   Consulted and Agree with Plan of Care Patient        Problem List Patient Active Problem List   Diagnosis Date Noted  . 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 Chest wall pain 03/21/2014  .  Abnormal LFTs (liver function tests) 09/12/2013  . Pleural effusion 08/18/2013  . 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     

## 2014-09-08 NOTE — Telephone Encounter (Signed)
Arlin called back to ensure Val has received message to call her.  Confirmed and will call her today on this matter.

## 2014-09-11 ENCOUNTER — Ambulatory Visit: Payer: 59 | Admitting: Physical Therapy

## 2014-09-11 DIAGNOSIS — M6289 Other specified disorders of muscle: Secondary | ICD-10-CM

## 2014-09-11 DIAGNOSIS — I890 Lymphedema, not elsewhere classified: Secondary | ICD-10-CM | POA: Diagnosis not present

## 2014-09-11 NOTE — Therapy (Signed)
[place], [place], [address] Phone: [phone]   Fax:  [phone]  Physical Therapy Treatment  Patient Details  Name: [name] [name] MRN: [id number] Date of Birth: 07-08-1960 Referring Provider:  Chauncey Cruel, MD  Encounter Date: 09/11/2014      PT End of Session - 09/11/14 1119    Visit Number 4   Number of Visits 81   Date for PT Re-Evaluation 12/01/14   PT Start Time 1030   PT Stop Time 1100   PT Time Calculation (min) 30 min      Past Medical History  Diagnosis Date  . Seizures 2010    Isolated incident.  Marland Kitchen PONV (postoperative nausea and vomiting)   . Peripheral vascular disease 02/2010    blood clot related to porta cath  . Breast cancer dx'd 2005/2011  . Bone metastases dx'd 05/2014  . S/P radiation therapy 07/17/2014 through 08/02/2014     Left mediastinum, left seventh rib 3250 cGy in 13 sessions     Past Surgical History  Procedure Laterality Date  . Breast lumpectomy  2005  . Axillary lymph node dissection  Dec. 2011  . Portacath placement  12/11  . Removal portacath    . Mediastinotomy chamberlain mcneil Left 06/02/2013    Procedure: MEDIASTINOTOMY CHAMBERLAIN MCNEIL;  Surgeon: [name], MD;  Location: [facility];  Service: Thoracic;  Laterality: Left;  LEFT ANTERIOR MEDIASTINOTOMY     There were no vitals filed for this visit.  Visit Diagnosis:  Lymphedema  Muscle stiffness      Subjective Assessment - 09/11/14 1116    Subjective pt reports she is having trouble because of the humiidity outside.  She feels very full on both sides of chest plans to have pleurex drained this later this morning.   Currently in Pain? Yes   Pain Score 4   3 at left flank   Pain Location Axilla   Pain Orientation Right   Pain Descriptors / Indicators Tightness   Pain Type Acute pain   Pain  Frequency Constant                         [facility] Adult PT Treatment/Exercise - 09/11/14 0001    Manual Therapy   Manual Lymphatic Drainage (MLD) In left sidelying, posterior interaxillary anastomosis and right axillo-inguinal anastomosis; in supine, short neck, left axilla and anterior interaxillary anastomosis, right groin and axillo-inguinal anastomosis, area between right breast scars, directing toward pathways.  In right sidelying, left periscapular area toward left groin.   Manual Therapy Other (comment)  In Rt. sidelying, soft tissue work to left upper back, neck                         [facility] Clinic Goals - 09/08/14 1247    CC Long Term Goal  #1   Baseline Goal is currently being met and is ongoing.   Status On-going   CC Long Term Goal  #2   Baseline Goal is currently being met and is ongoing.            Plan - 09/11/14 1119    Clinical Impression Statement pt visibly full prior to treatment with constriction marks from bra.  She states she feels better after manual lymph drainage treatment   PT Next Visit Plan Manual lymph drainage, myofascial release, soft tissue work.   PT Plan Continue manual lymph  drainage, soft tissue work, and myofascial release for pain and edema control.        Problem List Patient Active Problem List   Diagnosis Date Noted  . 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 Chest wall pain 03/21/2014  . Abnormal LFTs (liver function tests) 09/12/2013  . Pleural effusion 08/18/2013  . 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 Owens Shark, PT   09/11/2014, [place], [place], [address] Phone: [phone]   Fax:  [phone]

## 2014-09-12 ENCOUNTER — Ambulatory Visit (INDEPENDENT_AMBULATORY_CARE_PROVIDER_SITE_OTHER): Payer: 59 | Admitting: Thoracic Surgery (Cardiothoracic Vascular Surgery)

## 2014-09-12 ENCOUNTER — Encounter: Payer: Self-pay | Admitting: Thoracic Surgery (Cardiothoracic Vascular Surgery)

## 2014-09-12 VITALS — Ht 63.0 in | Wt 160.0 lb

## 2014-09-12 DIAGNOSIS — C50211 Malignant neoplasm of upper-inner quadrant of right female breast: Secondary | ICD-10-CM

## 2014-09-12 DIAGNOSIS — J910 Malignant pleural effusion: Secondary | ICD-10-CM

## 2014-09-12 NOTE — Progress Notes (Signed)
      [address]       [place] [address]             [phone]        Patient ID: [name] [name], female   DOB: 12-Nov-1960, 54 y.o.   MRN: [id number]  [name] [name] comes in today with questions regarding radiation treatment of her metastatic breast cancer.  She has a pleural catheter in place and was last seen in the office in March. She was scheduled to return and April, but was getting radiation therapy and wanted to wait until after that was complete. She says that she has been draining 200-250 mL twice a week.   She had a CT scan on June 6 and saw Dr. Jana Hakim and Dr. Valere Dross on the seventh. Per their notes she had a mixed response to the radiation.  She came in today with numerous questions regarding radiation to the entire chest and the effects that could have on her lung. She had questions about whether that would cause her lung to shrink, and whether that would leave her oxygen dependent and chronically short of breath. She wanted to know if there was a surgical option.  I spent a long time in discussion with [name] [name] and her husband.   There is no surgical option for resection.  I strongly emphasized that Dr. Valere Dross could answer questions regarding radiation injury and side effects far better than I could. He would also note what doses might be used and whether its even technically feasible to radiate the entire chest wall.  We discussed talc pleurodesis as a palliative measure, but I made it clear that that does not treat the cancer at all. She does not want to consider top pleurodesis until the question of radiation therapy has been answered.  She will follow-up after decisions have been made regarding additional radiation.  The entirety of the visit was spent in consultation. I spent 15 minutes face-to-face with [name] [name] [name], MD [facility] [phone]

## 2014-09-14 ENCOUNTER — Ambulatory Visit: Payer: 59 | Admitting: Physical Therapy

## 2014-09-14 DIAGNOSIS — M6289 Other specified disorders of muscle: Secondary | ICD-10-CM

## 2014-09-14 DIAGNOSIS — I890 Lymphedema, not elsewhere classified: Secondary | ICD-10-CM | POA: Diagnosis not present

## 2014-09-14 DIAGNOSIS — R0789 Other chest pain: Secondary | ICD-10-CM

## 2014-09-14 NOTE — Therapy (Signed)
[place], [place], [address] Phone: [phone]   Fax:  [phone]  Physical Therapy Treatment  Patient Details  Name: [name] [name] MRN: [id number] Date of Birth: 1960/11/18 Referring Provider:  Chauncey Cruel, MD  Encounter Date: 09/14/2014      PT End of Session - 09/14/14 0936    Visit Number 38   Number of Visits 27   Date for PT Re-Evaluation 12/01/14   PT Start Time 0835   PT Stop Time 0921   PT Time Calculation (min) 46 min   Activity Tolerance Patient tolerated treatment well   Behavior During Therapy [facility] for tasks assessed/performed      Past Medical History  Diagnosis Date  . Seizures 2010    Isolated incident.  Marland Kitchen PONV (postoperative nausea and vomiting)   . Peripheral vascular disease 02/2010    blood clot related to porta cath  . Breast cancer dx'd 2005/2011  . Bone metastases dx'd 05/2014  . S/P radiation therapy 07/17/2014 through 08/02/2014     Left mediastinum, left seventh rib 3250 cGy in 13 sessions     Past Surgical History  Procedure Laterality Date  . Breast lumpectomy  2005  . Axillary lymph node dissection  Dec. 2011  . Portacath placement  12/11  . Removal portacath    . Mediastinotomy chamberlain mcneil Left 06/02/2013    Procedure: MEDIASTINOTOMY CHAMBERLAIN MCNEIL;  Surgeon: [name], MD;  Location: [facility];  Service: Thoracic;  Laterality: Left;  LEFT ANTERIOR MEDIASTINOTOMY     There were no vitals filed for this visit.  Visit Diagnosis:  Lymphedema  Muscle stiffness  Other chest pain      Subjective Assessment - 09/14/14 0832    Subjective It's pretty bad because of the humidity.  It's hard inside here (right lateral breast area).   Currently in Pain? Yes   Pain Score 6    Pain Location Axilla   Pain Orientation Right   Aggravating Factors   humiditiy                         [facility] Adult PT Treatment/Exercise - 09/14/14 0001    Manual Therapy   Manual Therapy Myofascial release   Myofascial Release In supine, soft tissue mobilization and release at right axilla.   Manual Lymphatic Drainage (MLD) In left sidelying, posterior interaxillary anastomosis and right axillo-inguinal anastomosis; in supine, short neck, left axilla and anterior interaxillary anastomosis, right groin and axillo-inguinal anastomosis, area between right breast scars, directing toward pathways.  In right sidelying, left periscapular area toward left groin.   Manual Therapy Other (comment)  In Rt. sidelying, soft tissue work to left upper back, neck                         Long Term Clinic Goals - 09/14/14 0939    CC Long Term Goal  #1   Status On-going   CC Long Term Goal  #2   Status On-going            Plan - 09/14/14 8527    Clinical Impression Statement Again, fullness noted at right lateral breast area between and around incision lines.     Pt will benefit from skilled therapeutic intervention in order to improve on the following deficits Increased edema;Pain;Increased fascial restricitons   Rehab Potential Good   Clinical Impairments Affecting Rehab Potential active cancer  PT Frequency 2x / week   PT Duration 12 weeks   PT Treatment/Interventions Manual lymph drainage;Manual techniques   PT Next Visit Plan Manual lymph drainage, myofascial release, soft tissue work.   Consulted and Agree with Plan of Care Patient        Problem List Patient Active Problem List   Diagnosis Date Noted  . 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 Chest wall pain 03/21/2014  . Abnormal LFTs (liver function tests) 09/12/2013  . Pleural effusion 08/18/2013  . 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 Secondary malignant neoplasm of mediastinal lymph node 08/18/2013     Cassi Jenne 09/14/2014, 9:40 AM  [place], [place], [address] Phone: [phone]   Fax:  [place], PT 09/14/2014 9:41 AM

## 2014-09-19 ENCOUNTER — Ambulatory Visit: Payer: 59 | Admitting: Physical Therapy

## 2014-09-19 ENCOUNTER — Telehealth: Payer: Self-pay | Admitting: *Deleted

## 2014-09-19 DIAGNOSIS — I890 Lymphedema, not elsewhere classified: Secondary | ICD-10-CM | POA: Diagnosis not present

## 2014-09-19 DIAGNOSIS — R0789 Other chest pain: Secondary | ICD-10-CM

## 2014-09-19 DIAGNOSIS — M6289 Other specified disorders of muscle: Secondary | ICD-10-CM

## 2014-09-19 NOTE — Therapy (Signed)
[place], [place], [address] Phone: [phone]   Fax:  [phone]  Physical Therapy Treatment  Patient Details  Name: [name] [name] MRN: [id number] Date of Birth: 1960-05-01 Referring Provider:  Chauncey Cruel, MD  Encounter Date: 09/19/2014      PT End of Session - 09/19/14 1553    Visit Number 50   Number of Visits 28   Date for PT Re-Evaluation 12/01/14   PT Start Time 1301   PT Stop Time 1348   PT Time Calculation (min) 47 min   Activity Tolerance Patient tolerated treatment well   Behavior During Therapy [facility] for tasks assessed/performed      Past Medical History  Diagnosis Date  . Seizures 2010    Isolated incident.  Marland Kitchen PONV (postoperative nausea and vomiting)   . Peripheral vascular disease 02/2010    blood clot related to porta cath  . Breast cancer dx'd 2005/2011  . Bone metastases dx'd 05/2014  . S/P radiation therapy 07/17/2014 through 08/02/2014     Left mediastinum, left seventh rib 3250 cGy in 13 sessions     Past Surgical History  Procedure Laterality Date  . Breast lumpectomy  2005  . Axillary lymph node dissection  Dec. 2011  . Portacath placement  12/11  . Removal portacath    . Mediastinotomy chamberlain mcneil Left 06/02/2013    Procedure: MEDIASTINOTOMY CHAMBERLAIN MCNEIL;  Surgeon: [name], MD;  Location: [facility];  Service: Thoracic;  Laterality: Left;  LEFT ANTERIOR MEDIASTINOTOMY     There were no vitals filed for this visit.  Visit Diagnosis:  Lymphedema  Muscle stiffness  Other chest pain      Subjective Assessment - 09/19/14 1301    Subjective "I'm having a bad body day."  Still much more fatigued than before radiation.   Currently in Pain? Yes   Pain Score 5    Pain Location Axilla   Pain Orientation Right   Aggravating Factors  weather; several  days since last treatment                         [facility] Adult PT Treatment/Exercise - 09/19/14 0001    Manual Therapy   Myofascial Release In supine, soft tissue mobilization and release at right axilla.   Manual Lymphatic Drainage (MLD) In left sidelying, posterior interaxillary anastomosis and right axillo-inguinal anastomosis; in supine, short neck, left axilla and anterior interaxillary anastomosis, right groin and axillo-inguinal anastomosis, area between right breast scars, directing toward pathways.  In right sidelying, left periscapular area toward left groin.   Manual Therapy Other (comment)  In Rt. sidelying, soft tissue work to left upper back, neck                         Long Term Clinic Goals - 09/19/14 1555    CC Long Term Goal  #1   Status On-going   CC Long Term Goal  #2   Status On-going            Plan - 09/19/14 1553    Clinical Impression Statement Patient's discomfort was at a higher level at start of session today, both because of hot, humid weather and because of a longer time between therapy sessions than usual.  However, at the end of session, she reported feeling better again.   Pt will benefit from skilled therapeutic intervention in order to improve  on the following deficits Increased edema;Pain;Increased fascial restricitons   Rehab Potential Good   Clinical Impairments Affecting Rehab Potential active cancer   PT Frequency 2x / week   PT Duration 12 weeks   PT Treatment/Interventions Manual lymph drainage;Manual techniques   PT Next Visit Plan Manual lymph drainage, myofascial release, soft tissue work.   Consulted and Agree with Plan of Care Patient        Problem List Patient Active Problem List   Diagnosis Date Noted  . 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 Chest wall pain 03/21/2014  . Abnormal LFTs (liver function tests) 09/12/2013  . Pleural effusion 08/18/2013  . Breast cancer of  upper-inner quadrant of right female breast 08/18/2013  . Secondary malignant neoplasm of mediastinal lymph node 08/18/2013    [name],[name] 09/19/2014, 3:56 PM  [place], [place], [address] Phone: [phone]   Fax:  [place], PT 09/19/2014 3:57 PM

## 2014-09-19 NOTE — Telephone Encounter (Signed)
[name] [name] WAS ASKING PT. QUESTIONS CONCERNING THE MRI. PT. STATED SHE HAS HAD A RIGHT BREAST CLIP SINCE 2005. WILL NOT BE ABLE TO PERFORM THE MRI OF THE BRAIN IF THIS IS THE CASE. DR.HARDCASTLE DID PT.'S SURGERY IN 2005. SHOULD RADIOLOGY CHECK WITH DR.HARDCASTLE'S OFFICE?

## 2014-09-19 NOTE — Telephone Encounter (Signed)
[name] [name], I don't know who Vickii Chafe is  The pt has had multiple MRIs in past w/o event  Thanks!

## 2014-09-21 ENCOUNTER — Telehealth: Payer: Self-pay

## 2014-09-21 NOTE — Telephone Encounter (Signed)
Should patient call in again, please let her know that [name] [name] is out of the office until Monday, however I know she is working on the Pleurx drain order for late July.    Please also let her know that she should keep her MRI appt for 8/1.  There should be adequate time between the scan and her appt with Dr. Jana Hakim for the scan to be read.

## 2014-09-22 ENCOUNTER — Telehealth: Payer: Self-pay | Admitting: *Deleted

## 2014-09-22 ENCOUNTER — Telehealth: Payer: Self-pay

## 2014-09-22 ENCOUNTER — Ambulatory Visit: Payer: 59 | Admitting: Physical Therapy

## 2014-09-22 DIAGNOSIS — M6289 Other specified disorders of muscle: Secondary | ICD-10-CM

## 2014-09-22 DIAGNOSIS — I890 Lymphedema, not elsewhere classified: Secondary | ICD-10-CM | POA: Diagnosis not present

## 2014-09-22 DIAGNOSIS — R0789 Other chest pain: Secondary | ICD-10-CM

## 2014-09-22 NOTE — Telephone Encounter (Signed)
Signed order faxed to [facility].  Sent to scan.

## 2014-09-22 NOTE — Telephone Encounter (Signed)
TC from patient requesting to change the date of her brain MRI to 10/18/14 from 10/30/14 as she doesn't think she will have enough time to do on 10/30/14-several other appts scheduled that day including PET scan @ 2 pm and then brian mri @3p .  Informed Maciel that Val was not in office today and would be back on 08/2714.

## 2014-09-22 NOTE — Therapy (Signed)
[place], [place], [address] Phone: [phone]   Fax:  [phone]  Physical Therapy Treatment  Patient Details  Name: [name] [name] MRN: [id number] Date of Birth: Jan 29, 1961 Referring Provider:  Chauncey Cruel, MD  Encounter Date: 09/22/2014      PT End of Session - 09/22/14 1113    Visit Number 51   Number of Visits 37   Date for PT Re-Evaluation 12/01/14   PT Start Time 1020   PT Stop Time 1105   PT Time Calculation (min) 45 min   Activity Tolerance Patient tolerated treatment well   Behavior During Therapy [facility] for tasks assessed/performed      Past Medical History  Diagnosis Date  . Seizures 2010    Isolated incident.  Marland Kitchen PONV (postoperative nausea and vomiting)   . Peripheral vascular disease 02/2010    blood clot related to porta cath  . Breast cancer dx'd 2005/2011  . Bone metastases dx'd 05/2014  . S/P radiation therapy 07/17/2014 through 08/02/2014     Left mediastinum, left seventh rib 3250 cGy in 13 sessions     Past Surgical History  Procedure Laterality Date  . Breast lumpectomy  2005  . Axillary lymph node dissection  Dec. 2011  . Portacath placement  12/11  . Removal portacath    . Mediastinotomy chamberlain mcneil Left 06/02/2013    Procedure: MEDIASTINOTOMY CHAMBERLAIN MCNEIL;  Surgeon: [name], MD;  Location: [facility];  Service: Thoracic;  Laterality: Left;  LEFT ANTERIOR MEDIASTINOTOMY     There were no vitals filed for this visit.  Visit Diagnosis:  Lymphedema  Muscle stiffness  Other chest pain      Subjective Assessment - 09/22/14 1022    Subjective Trying to change the brain MRI date to 10/18/14.   Currently in Pain? Yes   Pain Score 5    Pain Location Axilla   Pain Orientation Right   Pain Descriptors / Indicators Heaviness   Aggravating Factors  weather                          [facility] Adult PT Treatment/Exercise - 09/22/14 0001    Manual Therapy   Myofascial Release In supine, soft tissue mobilization and release at right axilla.   Manual Lymphatic Drainage (MLD) In left sidelying, posterior interaxillary anastomosis and right axillo-inguinal anastomosis; in supine, short neck, left axilla and anterior interaxillary anastomosis, right groin and axillo-inguinal anastomosis, area between right breast scars, directing toward pathways.  In right sidelying, left periscapular area toward left groin.   Manual Therapy Other (comment)  In Rt. sidelying, soft tissue work to left upper back, neck                         Long Term Clinic Goals - 09/19/14 1555    CC Long Term Goal  #1   Status On-going   CC Long Term Goal  #2   Status On-going            Plan - 09/22/14 1113    Clinical Impression Statement Same as last time with increased discomfort due to hot, humid weather--discomfort at right axilla.  Therapy sessions continue to reduce pain and swelling.   Pt will benefit from skilled therapeutic intervention in order to improve on the following deficits Increased edema;Pain;Increased fascial restricitons   PT Treatment/Interventions Manual lymph drainage;Manual techniques   PT Next  Visit Plan Manual lymph drainage, myofascial release, soft tissue work.        Problem List Patient Active Problem List   Diagnosis Date Noted  . 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 Chest wall pain 03/21/2014  . Abnormal LFTs (liver function tests) 09/12/2013  . Pleural effusion 08/18/2013  . 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

## 2014-09-25 ENCOUNTER — Ambulatory Visit: Payer: 59 | Admitting: Physical Therapy

## 2014-09-25 DIAGNOSIS — I890 Lymphedema, not elsewhere classified: Secondary | ICD-10-CM

## 2014-09-25 DIAGNOSIS — M6289 Other specified disorders of muscle: Secondary | ICD-10-CM

## 2014-09-25 DIAGNOSIS — R0789 Other chest pain: Secondary | ICD-10-CM

## 2014-09-25 NOTE — Therapy (Signed)
[place], [place], [address] Phone: [phone]   Fax:  [phone]  Physical Therapy Treatment  Patient Details  Name: [name] [name] MRN: [id number] Date of Birth: [birth year] Referring Provider:  Chauncey Cruel, MD  Encounter Date: 09/25/2014      PT End of Session - 09/25/14 1201    Visit Number 52   Number of Visits 63   Date for PT Re-Evaluation 12/01/14   PT Start Time 1022   PT Stop Time 1107   PT Time Calculation (min) 45 min   Activity Tolerance Patient tolerated treatment well   Behavior During Therapy [facility] for tasks assessed/performed      Past Medical History  Diagnosis Date  . Seizures 2010    Isolated incident.  Marland Kitchen PONV (postoperative nausea and vomiting)   . Peripheral vascular disease 02/2010    blood clot related to porta cath  . Breast cancer dx'd 2005/2011  . Bone metastases dx'd 05/2014  . S/P radiation therapy 07/17/2014 through 08/02/2014     Left mediastinum, left seventh rib 3250 cGy in 13 sessions     Past Surgical History  Procedure Laterality Date  . Breast lumpectomy  2005  . Axillary lymph node dissection  Dec. 2011  . Portacath placement  12/11  . Removal portacath    . Mediastinotomy chamberlain mcneil Left 06/02/2013    Procedure: MEDIASTINOTOMY CHAMBERLAIN MCNEIL;  Surgeon: [name], MD;  Location: [place];  Service: Thoracic;  Laterality: Left;  LEFT ANTERIOR MEDIASTINOTOMY     There were no vitals filed for this visit.  Visit Diagnosis:  Lymphedema  Muscle stiffness  Other chest pain      Subjective Assessment - 09/25/14 1022    Subjective "Having one of those days.  Brain is in a fog."  Had pain Saturday through yesterday at left shoulder/upper chest/axilla area.  I'm breathing fine.   Currently in Pain? Yes   Pain Score 4    Pain Location Flank    Pain Orientation Left   Pain Descriptors / Indicators Other (Comment)  full   Aggravating Factors  unsure with left flank pain; right axilla fullness increased from walking, etc.                         [facility] Adult PT Treatment/Exercise - 09/25/14 0001    Manual Therapy   Myofascial Release In supine, soft tissue mobilization and release at right axilla.   Manual Lymphatic Drainage (MLD) In left sidelying, posterior interaxillary anastomosis and right axillo-inguinal anastomosis; in supine, short neck, left axilla and anterior interaxillary anastomosis, right groin and axillo-inguinal anastomosis, area between right breast scars, directing toward pathways.  In right sidelying, left periscapular area toward left groin.   Manual Therapy Other (comment)  In Rt. sidelying, soft tissue work to left upper back, neck                         Long Term Clinic Goals - 09/19/14 1555    CC Long Term Goal  #1   Status On-going   CC Long Term Goal  #2   Status On-going            Plan - 09/25/14 1201    Clinical Impression Statement Patient with report of, and palpable, increased fullness at right lateral breast area between and around scars, at right axilla and at right upper breast today.  Patient continues to feel definite benefit of therapy for improving comfort and decreasing swelling.   Pt will benefit from skilled therapeutic intervention in order to improve on the following deficits Increased edema;Pain;Increased fascial restricitons   Rehab Potential Good   Clinical Impairments Affecting Rehab Potential active cancer   PT Frequency 2x / week   PT Duration 12 weeks   PT Treatment/Interventions Manual lymph drainage;Manual techniques   PT Next Visit Plan Manual lymph drainage, myofascial release, soft tissue work.   Consulted and Agree with Plan of Care Patient        Problem List Patient Active Problem List   Diagnosis Date Noted  .  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 Chest wall pain 03/21/2014  . Abnormal LFTs (liver function tests) 09/12/2013  . Pleural effusion 08/18/2013  . 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

## 2014-09-26 ENCOUNTER — Telehealth: Payer: Self-pay | Admitting: Oncology

## 2014-09-26 NOTE — Telephone Encounter (Signed)
Patient called in to add a lab with sabrina and iv access for her scan  [name] [name]

## 2014-09-27 ENCOUNTER — Telehealth: Payer: Self-pay | Admitting: *Deleted

## 2014-09-27 ENCOUNTER — Other Ambulatory Visit: Payer: Self-pay | Admitting: *Deleted

## 2014-09-29 ENCOUNTER — Ambulatory Visit: Payer: 59 | Attending: Oncology | Admitting: Physical Therapy

## 2014-09-29 ENCOUNTER — Ambulatory Visit: Payer: Self-pay

## 2014-09-29 DIAGNOSIS — G729 Myopathy, unspecified: Secondary | ICD-10-CM | POA: Diagnosis present

## 2014-09-29 DIAGNOSIS — R0789 Other chest pain: Secondary | ICD-10-CM | POA: Insufficient documentation

## 2014-09-29 DIAGNOSIS — I890 Lymphedema, not elsewhere classified: Secondary | ICD-10-CM | POA: Diagnosis not present

## 2014-09-29 DIAGNOSIS — M545 Low back pain: Secondary | ICD-10-CM | POA: Diagnosis present

## 2014-09-29 DIAGNOSIS — M6289 Other specified disorders of muscle: Secondary | ICD-10-CM

## 2014-09-29 NOTE — Therapy (Signed)
[place], [place], [address] Phone: [phone]   Fax:  [phone]  Physical Therapy Treatment  Patient Details  Name: [name] [name] MRN: [id number] Date of Birth: [birth year] Referring Provider:  Chauncey Cruel, MD  Encounter Date: 09/29/2014      PT End of Session - 09/29/14 1256    Visit Number 52   Number of Visits 69   Date for PT Re-Evaluation 12/01/14   PT Start Time 1020   PT Stop Time 1104   PT Time Calculation (min) 44 min   Activity Tolerance Patient tolerated treatment well   Behavior During Therapy [facility] for tasks assessed/performed      Past Medical History  Diagnosis Date  . Seizures 2010    Isolated incident.  Marland Kitchen PONV (postoperative nausea and vomiting)   . Peripheral vascular disease 02/2010    blood clot related to porta cath  . Breast cancer dx'd 2005/2011  . Bone metastases dx'd 05/2014  . S/P radiation therapy 07/17/2014 through 08/02/2014     Left mediastinum, left seventh rib 3250 cGy in 13 sessions     Past Surgical History  Procedure Laterality Date  . Breast lumpectomy  2005  . Axillary lymph node dissection  Dec. 2011  . Portacath placement  12/11  . Removal portacath    . Mediastinotomy chamberlain mcneil Left 06/02/2013    Procedure: MEDIASTINOTOMY CHAMBERLAIN MCNEIL;  Surgeon: [name], MD;  Location: [place];  Service: Thoracic;  Laterality: Left;  LEFT ANTERIOR MEDIASTINOTOMY     There were no vitals filed for this visit.  Visit Diagnosis:  Lymphedema  Muscle stiffness  Other chest pain      Subjective Assessment - 09/29/14 1022    Subjective "I'm really not myself today--my body and brain."   Currently in Pain? Yes   Pain Score 4   3 on left flank/left lateral mid-back   Pain Location Axilla   Pain Orientation Right                          [facility] Adult PT Treatment/Exercise - 09/29/14 0001    Manual Therapy   Myofascial Release In supine, soft tissue mobilization and release at right axilla.   Manual Lymphatic Drainage (MLD) In left sidelying, posterior interaxillary anastomosis and right axillo-inguinal anastomosis; in supine, short neck, left axilla and anterior interaxillary anastomosis, right groin and axillo-inguinal anastomosis, area between right breast scars, directing toward pathways.  In right sidelying, left periscapular area toward left groin.   Manual Therapy Other (comment)  In Rt. sidelying, soft tissue work to left back, neck                         Long Term Clinic Goals - 09/29/14 1258    CC Long Term Goal  #1   Status On-going   CC Long Term Goal  #2   Status On-going            Plan - 09/29/14 1257    Clinical Impression Statement Again the right lateral breast area feels fuller than average today; pt. also with left back pain going down to low back (instead of just upper-mid back) today.  Both helped by treatment.   Pt will benefit from skilled therapeutic intervention in order to improve on the following deficits Increased edema;Pain;Increased fascial restricitons   Rehab Potential Good   PT Treatment/Interventions Manual lymph  drainage;Manual techniques   PT Next Visit Plan Manual lymph drainage, myofascial release, soft tissue work.   Consulted and Agree with Plan of Care Patient        Problem List Patient Active Problem List   Diagnosis Date Noted  . 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 Chest wall pain 03/21/2014  . Abnormal LFTs (liver function tests) 09/12/2013  . Pleural effusion 08/18/2013  . 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

## 2014-10-03 ENCOUNTER — Ambulatory Visit: Payer: 59 | Admitting: Physical Therapy

## 2014-10-03 DIAGNOSIS — M6289 Other specified disorders of muscle: Secondary | ICD-10-CM

## 2014-10-03 DIAGNOSIS — M545 Low back pain, unspecified: Secondary | ICD-10-CM

## 2014-10-03 DIAGNOSIS — I890 Lymphedema, not elsewhere classified: Secondary | ICD-10-CM

## 2014-10-03 DIAGNOSIS — R0789 Other chest pain: Secondary | ICD-10-CM

## 2014-10-03 NOTE — Therapy (Signed)
[place], [place], [address] Phone: [phone]   Fax:  [phone]  Physical Therapy Treatment  Patient Details  Name: [name] [name] MRN: [id number] Date of Birth: 10/21/1960 Referring Provider:  Chauncey Cruel, MD  Encounter Date: 10/03/2014      PT End of Session - 10/03/14 1730    Visit Number 19   Number of Visits 37   Date for PT Re-Evaluation 12/01/14   PT Start Time 1301   PT Stop Time 9937   PT Time Calculation (min) 46 min   Activity Tolerance Patient tolerated treatment well   Behavior During Therapy [facility] for tasks assessed/performed      Past Medical History  Diagnosis Date  . Seizures 2010    Isolated incident.  Marland Kitchen PONV (postoperative nausea and vomiting)   . Peripheral vascular disease 02/2010    blood clot related to porta cath  . Breast cancer dx'd 2005/2011  . Bone metastases dx'd 05/2014  . S/P radiation therapy 07/17/2014 through 08/02/2014     Left mediastinum, left seventh rib 3250 cGy in 13 sessions     Past Surgical History  Procedure Laterality Date  . Breast lumpectomy  2005  . Axillary lymph node dissection  Dec. 2011  . Portacath placement  12/11  . Removal portacath    . Mediastinotomy chamberlain mcneil Left 06/02/2013    Procedure: MEDIASTINOTOMY CHAMBERLAIN MCNEIL;  Surgeon: [name], MD;  Location: [place];  Service: Thoracic;  Laterality: Left;  LEFT ANTERIOR MEDIASTINOTOMY     There were no vitals filed for this visit.  Visit Diagnosis:  Lymphedema  Muscle stiffness  Other chest pain  Intermittent low back pain      Subjective Assessment - 10/03/14 1303    Subjective Really full today.  Didn't feel well this weekend--just walked once.   Currently in Pain? Yes   Pain Score 4    Pain Location Axilla  left side less pain than right   Pain  Orientation Right   Pain Relieving Factors motrin                         [facility] Adult PT Treatment/Exercise - 10/03/14 0001    Manual Therapy   Myofascial Release In supine, soft tissue mobilization and release at right axilla.   Manual Lymphatic Drainage (MLD) In left sidelying, posterior interaxillary anastomosis and right axillo-inguinal anastomosis; in supine, short neck, left axilla and anterior interaxillary anastomosis, right groin and axillo-inguinal anastomosis, area between right breast scars, directing toward pathways.  In right sidelying, left periscapular area toward left groin.   Manual Therapy Other (comment)  In Rt. sidelying, soft tissue work to left back, neck                         Long Term Clinic Goals - 09/29/14 1258    CC Long Term Goal  #1   Status On-going   CC Long Term Goal  #2   Status On-going            Plan - 10/03/14 1730    Clinical Impression Statement Patient with more discomfort and fullness today at right axilla due to hot, humid weather and not having been seen in several days for therapy (holiday yesterday); slow with more pain at left low back, as on last visit.   Pt will benefit from skilled therapeutic intervention in order to  improve on the following deficits Increased edema;Pain;Increased fascial restricitons   Rehab Potential Good   Clinical Impairments Affecting Rehab Potential active cancer   PT Frequency 2x / week   PT Duration 12 weeks   PT Treatment/Interventions Manual lymph drainage;Manual techniques   PT Next Visit Plan Manual lymph drainage, myofascial release, soft tissue work.   Consulted and Agree with Plan of Care Patient        Problem List Patient Active Problem List   Diagnosis Date Noted  . 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 Chest wall pain 03/21/2014  . Abnormal LFTs (liver function tests) 09/12/2013  . Pleural effusion 08/18/2013  . Breast cancer of  upper-inner quadrant of right female breast 08/18/2013  . 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

## 2014-10-05 ENCOUNTER — Ambulatory Visit (HOSPITAL_COMMUNITY)
Admission: RE | Admit: 2014-10-05 | Discharge: 2014-10-05 | Disposition: A | Discharge status: Home / Self Care | Payer: 59 | Source: Ambulatory Visit | Attending: Oncology | Admitting: Oncology

## 2014-10-05 ENCOUNTER — Telehealth: Payer: Self-pay | Admitting: *Deleted

## 2014-10-05 ENCOUNTER — Other Ambulatory Visit: Payer: Self-pay | Admitting: *Deleted

## 2014-10-05 DIAGNOSIS — C50211 Malignant neoplasm of upper-inner quadrant of right female breast: Secondary | ICD-10-CM

## 2014-10-05 DIAGNOSIS — J90 Pleural effusion, not elsewhere classified: Secondary | ICD-10-CM

## 2014-10-05 DIAGNOSIS — Z853 Personal history of malignant neoplasm of breast: Secondary | ICD-10-CM | POA: Diagnosis not present

## 2014-10-05 DIAGNOSIS — R0789 Other chest pain: Secondary | ICD-10-CM

## 2014-10-05 NOTE — Telephone Encounter (Signed)
Val, thanks,  Called patient after Dr. Valere Dross said to call Dr. [name] or Dr. Jana Hakim, it shouldn't  Be  From, radiation,  Nira Retort

## 2014-10-05 NOTE — Telephone Encounter (Signed)
Patient called and left vm to call her, returned call patient asked if Dr. Valere Dross has had any situations like this , she completed radiation in May 2016, has a pleurex catheter for a year and this past Monday  Has had some blood in the drain and has increased some today, she wants to ask Dr. Valere Dross if he has had situations like tyhis, unable to answer patient, she placed me on hold,lost connection, will e-mail Dr. Valere Dross, and in basket him, as he will be back this afternoon, will call patient back after MD  Aware, patient called back and knows that Dr. Valere Dross is to be inormed, aklso Dr. Jana Hakim should be notified,she stated Dr. Jana Hakim ordered the pleurex catheter 1:28 PM .

## 2014-10-05 NOTE — Telephone Encounter (Signed)
This RN received message from [place] - home health nurse - per same concern.  Of note- Kaitlyn states no changes in pt's breathing, pain or amount of fluid.  Noted change is in color with " streaks of blood in tubing ".  Dr Jannifer Rodney made aware- pt is scheduled for scans later this month prior to next follow up appointment.

## 2014-10-06 ENCOUNTER — Encounter: Payer: Self-pay | Admitting: *Deleted

## 2014-10-06 ENCOUNTER — Telehealth: Payer: Self-pay | Admitting: *Deleted

## 2014-10-06 ENCOUNTER — Other Ambulatory Visit: Payer: Self-pay | Admitting: *Deleted

## 2014-10-06 ENCOUNTER — Ambulatory Visit: Payer: 59 | Admitting: Physical Therapy

## 2014-10-06 DIAGNOSIS — R0789 Other chest pain: Secondary | ICD-10-CM

## 2014-10-06 DIAGNOSIS — M545 Low back pain, unspecified: Secondary | ICD-10-CM

## 2014-10-06 DIAGNOSIS — R7989 Other specified abnormal findings of blood chemistry: Secondary | ICD-10-CM

## 2014-10-06 DIAGNOSIS — R945 Abnormal results of liver function studies: Secondary | ICD-10-CM

## 2014-10-06 DIAGNOSIS — I890 Lymphedema, not elsewhere classified: Secondary | ICD-10-CM

## 2014-10-06 DIAGNOSIS — J90 Pleural effusion, not elsewhere classified: Secondary | ICD-10-CM

## 2014-10-06 DIAGNOSIS — M6289 Other specified disorders of muscle: Secondary | ICD-10-CM

## 2014-10-06 DIAGNOSIS — C50211 Malignant neoplasm of upper-inner quadrant of right female breast: Secondary | ICD-10-CM

## 2014-10-06 DIAGNOSIS — C771 Secondary and unspecified malignant neoplasm of intrathoracic lymph nodes: Secondary | ICD-10-CM

## 2014-10-06 NOTE — Telephone Encounter (Signed)
This RN left message on VM for IR regarding concerns with PlureX catheter-   Noted change in color of fluid as well as pain at site.  CXR obtained yesterday showed overall stable disease-but increase in fluid. Pt was only able to drain about 152ml yesterday- which is much less then previously.  Message left IR was to clarify what scan is best to evaluate tubing and pt's disease status.  This RN's name and return call number given for contact.

## 2014-10-06 NOTE — Therapy (Signed)
[place], [place], [address] Phone: [phone]   Fax:  [phone]  Physical Therapy Treatment  Patient Details  Name: [name] [name] MRN: [id number] Date of Birth: [birth year] Referring Provider:  Chauncey Cruel, MD  Encounter Date: 10/06/2014      PT End of Session - 10/06/14 1240    Visit Number 55   Number of Visits 41   Date for PT Re-Evaluation 12/01/14   PT Start Time 1019   PT Stop Time 1110   PT Time Calculation (min) 51 min   Activity Tolerance Patient tolerated treatment well   Behavior During Therapy [facility] for tasks assessed/performed      Past Medical History  Diagnosis Date  . Seizures 2010    Isolated incident.  Marland Kitchen PONV (postoperative nausea and vomiting)   . Peripheral vascular disease 02/2010    blood clot related to porta cath  . Breast cancer dx'd 2005/2011  . Bone metastases dx'd 05/2014  . S/P radiation therapy 07/17/2014 through 08/02/2014     Left mediastinum, left seventh rib 3250 cGy in 13 sessions     Past Surgical History  Procedure Laterality Date  . Breast lumpectomy  2005  . Axillary lymph node dissection  Dec. 2011  . Portacath placement  12/11  . Removal portacath    . Mediastinotomy chamberlain mcneil Left 06/02/2013    Procedure: MEDIASTINOTOMY CHAMBERLAIN MCNEIL;  Surgeon: [name], MD;  Location: [place];  Service: Thoracic;  Laterality: Left;  LEFT ANTERIOR MEDIASTINOTOMY     There were no vitals filed for this visit.  Visit Diagnosis:  Lymphedema  Muscle stiffness  Other chest pain  Intermittent low back pain      Subjective Assessment - 10/06/14 1020    Subjective "Not doing good.  I've got blood in my tube.  Hurting more."   Currently in Pain? Yes   Pain Score 4    Pain Location Flank   Pain Orientation Left   Pain Descriptors / Indicators  Tender   Pain Onset More than a month ago   Aggravating Factors  touch the area, move a certain way   Pain Relieving Factors nothing                         [facility] Adult PT Treatment/Exercise - 10/06/14 0001    Manual Therapy   Myofascial Release In supine, soft tissue mobilization and release at right axilla.   Manual Lymphatic Drainage (MLD) In left sidelying, posterior interaxillary anastomosis and right axillo-inguinal anastomosis; in supine, short neck, left axilla and anterior interaxillary anastomosis, right groin and axillo-inguinal anastomosis, area between right breast scars, directing toward pathways, and right upper arm.  In right sidelying, left periscapular area toward left groin.   Manual Therapy Other (comment)  In Rt. sidelying, soft tissue work to left back, neck                         Long Term Clinic Goals - 10/06/14 1243    CC Long Term Goal  #1   Status On-going   CC Long Term Goal  #2   Status On-going            Plan - 10/06/14 1241    Clinical Impression Statement Patient with increased anxiety and some increase in pain today; left back feels swollen overall today.  Patient feels better after session.  Pt will benefit from skilled therapeutic intervention in order to improve on the following deficits Increased edema;Pain;Increased fascial restricitons   Rehab Potential Good   Clinical Impairments Affecting Rehab Potential active cancer   PT Frequency 2x / week   PT Duration 12 weeks   PT Treatment/Interventions Manual lymph drainage;Manual techniques   PT Next Visit Plan Manual lymph drainage, myofascial release, soft tissue work.   Consulted and Agree with Plan of Care Patient        Problem List Patient Active Problem List   Diagnosis Date Noted  . 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 Chest wall pain 03/21/2014  . Abnormal LFTs (liver function tests) 09/12/2013  . Pleural effusion 08/18/2013   . 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, PT 10/06/2014 12:44 PM

## 2014-10-06 NOTE — Progress Notes (Signed)
This RN spoke with IR MD who reviewed CXR from yesterday -  Best recommendation is for a CT of chest with contrast for evaluation.  Above reviewed with Dr Jannifer Rodney - orders obtained and entered.  Pt made aware including per IR doctor to drain as much as possible prior to CT for best evalation.

## 2014-10-09 ENCOUNTER — Ambulatory Visit: Payer: 59 | Admitting: Physical Therapy

## 2014-10-09 ENCOUNTER — Encounter: Payer: Self-pay | Admitting: *Deleted

## 2014-10-09 ENCOUNTER — Telehealth: Payer: Self-pay | Admitting: Oncology

## 2014-10-09 DIAGNOSIS — R0789 Other chest pain: Secondary | ICD-10-CM

## 2014-10-09 DIAGNOSIS — I890 Lymphedema, not elsewhere classified: Secondary | ICD-10-CM | POA: Diagnosis not present

## 2014-10-09 DIAGNOSIS — M545 Low back pain, unspecified: Secondary | ICD-10-CM

## 2014-10-09 DIAGNOSIS — M6289 Other specified disorders of muscle: Secondary | ICD-10-CM

## 2014-10-09 NOTE — Therapy (Signed)
[place], [place], [address] Phone: [phone]   Fax:  [phone]  Physical Therapy Treatment  Patient Details  Name: [name] [name] MRN: [id number] Date of Birth: [birth year] Referring Provider:  Chauncey Cruel, MD  Encounter Date: 10/09/2014      PT End of Session - 10/09/14 1201    Visit Number 47   Number of Visits 50   Date for PT Re-Evaluation 12/01/14   PT Start Time 1020   PT Stop Time 1100   PT Time Calculation (min) 40 min   Activity Tolerance Patient tolerated treatment well   Behavior During Therapy [facility] for tasks assessed/performed      Past Medical History  Diagnosis Date  . Seizures 2010    Isolated incident.  Marland Kitchen PONV (postoperative nausea and vomiting)   . Peripheral vascular disease 02/2010    blood clot related to porta cath  . Breast cancer dx'd 2005/2011  . Bone metastases dx'd 05/2014  . S/P radiation therapy 07/17/2014 through 08/02/2014     Left mediastinum, left seventh rib 3250 cGy in 13 sessions     Past Surgical History  Procedure Laterality Date  . Breast lumpectomy  2005  . Axillary lymph node dissection  Dec. 2011  . Portacath placement  12/11  . Removal portacath    . Mediastinotomy chamberlain mcneil Left 06/02/2013    Procedure: MEDIASTINOTOMY CHAMBERLAIN MCNEIL;  Surgeon: [name], MD;  Location: [place];  Service: Thoracic;  Laterality: Left;  LEFT ANTERIOR MEDIASTINOTOMY     There were no vitals filed for this visit.  Visit Diagnosis:  Lymphedema  Muscle stiffness  Other chest pain  Intermittent low back pain      Subjective Assessment - 10/09/14 1021    Subjective Very bad weekend--very bad.  Got drained again on Friday.   Currently in Pain? Yes   Pain Score 5    Pain Location Axilla   Pain Orientation Right   Aggravating Factors  swelling    Pain Relieving Factors therapy                         [facility] Adult PT Treatment/Exercise - 10/09/14 0001    Manual Therapy   Myofascial Release In supine, soft tissue mobilization and release at right axilla.   Manual Lymphatic Drainage (MLD) In left sidelying, posterior interaxillary anastomosis and right axillo-inguinal anastomosis; in supine, short neck, left axilla and anterior interaxillary anastomosis, right groin and axillo-inguinal anastomosis, area between right breast scars, directing toward pathways, and right upper arm.  In right sidelying, left periscapular area toward left groin.   Manual Therapy Other (comment)  In Rt. sidelying, soft tissue work to left back, neck                         Long Term Clinic Goals - 10/09/14 1203    CC Long Term Goal  #1   Status On-going   CC Long Term Goal  #2   Status On-going            Plan - 10/09/14 1202    Clinical Impression Statement Patient noted a "bump" -- area of firm tissue at right lateral breast under a scar today, and I confirmed this with palpation.  That area has felt indurated in the past, but this did seem to have more of a rounded shape.  Patient with increased pain recently  and hasn't been able to sleep well; did feel better after session.   Pt will benefit from skilled therapeutic intervention in order to improve on the following deficits Increased edema;Pain;Increased fascial restricitons   Rehab Potential Good   Clinical Impairments Affecting Rehab Potential active cancer   PT Frequency 2x / week   PT Duration 12 weeks   PT Treatment/Interventions Manual lymph drainage;Manual techniques   PT Next Visit Plan Manual lymph drainage, myofascial release, soft tissue work.   Consulted and Agree with Plan of Care Patient        Problem List Patient Active Problem List   Diagnosis Date Noted  . 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 Chest wall pain 03/21/2014   . Abnormal LFTs (liver function tests) 09/12/2013  . Pleural effusion 08/18/2013  . 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

## 2014-10-09 NOTE — Progress Notes (Signed)
Letter is printed and ready for pick up. Letter is in the lobby with Ms. Wilma.

## 2014-10-09 NOTE — Telephone Encounter (Signed)
Left message to confirm appointment for 07/14

## 2014-10-12 ENCOUNTER — Telehealth: Payer: Self-pay | Admitting: Oncology

## 2014-10-12 ENCOUNTER — Telehealth: Payer: Self-pay | Admitting: *Deleted

## 2014-10-12 ENCOUNTER — Ambulatory Visit: Payer: 59

## 2014-10-12 ENCOUNTER — Encounter (HOSPITAL_COMMUNITY): Payer: Self-pay

## 2014-10-12 ENCOUNTER — Other Ambulatory Visit: Payer: Self-pay | Admitting: Oncology

## 2014-10-12 ENCOUNTER — Other Ambulatory Visit: Payer: Self-pay | Admitting: *Deleted

## 2014-10-12 ENCOUNTER — Ambulatory Visit (HOSPITAL_BASED_OUTPATIENT_CLINIC_OR_DEPARTMENT_OTHER): Payer: 59 | Admitting: Oncology

## 2014-10-12 ENCOUNTER — Ambulatory Visit (HOSPITAL_COMMUNITY)
Admission: RE | Admit: 2014-10-12 | Discharge: 2014-10-12 | Disposition: A | Discharge status: Home / Self Care | Payer: 59 | Source: Ambulatory Visit | Attending: Oncology | Admitting: Oncology

## 2014-10-12 DIAGNOSIS — R945 Abnormal results of liver function studies: Secondary | ICD-10-CM

## 2014-10-12 DIAGNOSIS — M8580 Other specified disorders of bone density and structure, unspecified site: Secondary | ICD-10-CM

## 2014-10-12 DIAGNOSIS — J90 Pleural effusion, not elsewhere classified: Secondary | ICD-10-CM

## 2014-10-12 DIAGNOSIS — C771 Secondary and unspecified malignant neoplasm of intrathoracic lymph nodes: Secondary | ICD-10-CM

## 2014-10-12 DIAGNOSIS — C778 Secondary and unspecified malignant neoplasm of lymph nodes of multiple regions: Secondary | ICD-10-CM | POA: Diagnosis not present

## 2014-10-12 DIAGNOSIS — K760 Fatty (change of) liver, not elsewhere classified: Secondary | ICD-10-CM

## 2014-10-12 DIAGNOSIS — C50211 Malignant neoplasm of upper-inner quadrant of right female breast: Secondary | ICD-10-CM

## 2014-10-12 DIAGNOSIS — R0789 Other chest pain: Secondary | ICD-10-CM

## 2014-10-12 DIAGNOSIS — R7989 Other specified abnormal findings of blood chemistry: Secondary | ICD-10-CM

## 2014-10-12 DIAGNOSIS — I890 Lymphedema, not elsewhere classified: Secondary | ICD-10-CM

## 2014-10-12 DIAGNOSIS — C50411 Malignant neoplasm of upper-outer quadrant of right female breast: Secondary | ICD-10-CM

## 2014-10-12 DIAGNOSIS — Z86718 Personal history of other venous thrombosis and embolism: Secondary | ICD-10-CM

## 2014-10-12 DIAGNOSIS — C50919 Malignant neoplasm of unspecified site of unspecified female breast: Secondary | ICD-10-CM

## 2014-10-12 DIAGNOSIS — C7951 Secondary malignant neoplasm of bone: Secondary | ICD-10-CM | POA: Insufficient documentation

## 2014-10-12 DIAGNOSIS — Z170 Estrogen receptor positive status [ER+]: Secondary | ICD-10-CM

## 2014-10-12 DIAGNOSIS — C782 Secondary malignant neoplasm of pleura: Secondary | ICD-10-CM

## 2014-10-12 DIAGNOSIS — R0602 Shortness of breath: Secondary | ICD-10-CM | POA: Insufficient documentation

## 2014-10-12 MED ORDER — ALPRAZOLAM 0.5 MG PO TABS: 0.5000 mg | ORAL_TABLET | Freq: Two times a day (BID) | ORAL | Status: DC | PRN

## 2014-10-12 MED ORDER — IOHEXOL 350 MG/ML SOLN
100.0000 mL | Freq: Once | INTRAVENOUS | Status: AC | PRN
  Administered 2014-10-12: 100 mL via INTRAVENOUS

## 2014-10-12 NOTE — Telephone Encounter (Signed)
Added appt per pof...per orders pof pt aware °

## 2014-10-12 NOTE — Progress Notes (Signed)
[place]  [phone] Fax:(336) [phone]     ID: [name] [name] OB: December 14, 1960  MR#: [id number]  OJJ#:[id number]  PCP: Pcp Not In System GYN:  [name] [name] SU:  OTHER MD: [name] [name], [place], [name] [name], [name] [name], [name] [name]  CHIEF COMPLAINT: Stage IV breast cancer  CURRENT TREATMENT: exemestane  BREAST CANCER HISTORY: From doctor [name] [name]'s intake note 03/20/2004:  "The patient is a very pleasant 54 year old female, without significant past medical history.  Her family history is significant for a sister who at age 38 was diagnosed with invasive ductal carcinoma.  She is a breast cancer survivor at age 17 now.  The patient states that she has never really had a screening mammogram until October 2005, when she felt that it was time for her to start having mammograms done on a yearly basis.  Therefore, on 01/26/04, she underwent a screening mammogram and an abnormality was detected in the upper outer right breast.  She, therefore, underwent spot compression views of both the right and the left breast.  The left breast revealed a well-defined mass in the upper outer left quadrant, present at the 2 o'clock position, measuring 1.8 cm, 6 cm from the nipple.  This, by ultrasound, was felt to be a simple cyst measuring 1.8 cm.  On the right breast, a spiculated mass was noted in the upper outer right quadrant.  The ultrasound revealed a shadowing irregular solid mass at the 10:30 position, 9 cm from the nipple, measuring 1.2 cm in greatest dimension, correlating with the spiculated mass seen on the mammogram.  The right axilla was negative ultrasonically.  Because of this, the patient underwent a needle biopsy of the right breast and the biopsy was positive invasive mammary carcinoma that showed features consistent with a high-grade invasive ductal carcinoma associated with desmoplastic stroma.  No in situ  component was seen and no definite lymphovascular invasion was identified.  On the core biopsy, the tumor measured about 0.8 cm.  Because of this, she was seen by Dr. [name] and the patient was taken to the [facility] on March 15, 2004.  She underwent a right breast lumpectomy with sentinel node biopsy.  The final pathology revealed an invasive ductal carcinoma, measuring 1.7 cm, grade 2 of 3.  Margins were free of tumor.  Atypical lobular hyperplasia was noted.  One sentinel node was removed which was negative for metastatic disease.  The tumor was staged at T1c, N0 MX.  It was estrogen receptor positive, progesterone receptor positive.  HER-2/neu was 2+.  FISH was negative.  All margins were free of tumor.  She is now seen in Medical Oncology for further evaluation and management of this newly diagnosed T1c, node negative, stage I, invasive ductal carcinoma of the right breast."  Her subsequent history is as detailed below  INTERVAL HISTORY: [name] [name] returns today for an unscheduled visit accompanied by her husband [name] [name]. Approximately 10 days ago when she had her Pleurx drained at home they noted a little bit of blood. This has significantly alarmed her. On Thursday a week ago she had had in 75 mL drained. There was no clot or clogging of the 2. On Friday however she had an additional 180 mL and then she had pain that evening and the rest of the weekend. This has happened before, of course, went to much fluid is drained.--Because of the blood question she was set up for a CT scan of the  chest today and she wanted to see me afterwards to discuss results, which I was glad to be able to accommodate.   REVIEW OF SYSTEMS: [name] [name] continues on exemestane. She tolerates this moderately well. She does get loose bowel movements usually 1-3 every morning. She has hot flashes and she feels fatigued. On the plus side she was able to take a walk for up to 40 minutes with her husband a couple of weeks ago. She is  making sure to drink lots of fluids. She has not had fever, worsening cough, or phlegm production. A detailed review of systems was otherwise stable  PAST MEDICAL HISTORY: Past Medical History  Diagnosis Date  . Seizures 2010    Isolated incident.  Marland Kitchen PONV (postoperative nausea and vomiting)   . Peripheral vascular disease 02/2010    blood clot related to porta cath  . Breast cancer dx'd 2005/2011  . Bone metastases dx'd 05/2014  . S/P radiation therapy 07/17/2014 through 08/02/2014     Left mediastinum, left seventh rib 3250 cGy in 13 sessions     PAST SURGICAL HISTORY: Past Surgical History  Procedure Laterality Date  . Breast lumpectomy  2005  . Axillary lymph node dissection  Dec. 2011  . Portacath placement  12/11  . Removal portacath    . Mediastinotomy chamberlain mcneil Left 06/02/2013    Procedure: MEDIASTINOTOMY CHAMBERLAIN MCNEIL;  Surgeon: [name] Nakayama, MD;  Location: [facility] OR;  Service: Thoracic;  Laterality: Left;  LEFT ANTERIOR MEDIASTINOTOMY     FAMILY HISTORY Family History  Problem Relation Age of Onset  . COPD Mother   . Breast cancer Sister 63   The patient's father is living, 76 years old as of may 2015. He lives in [place]. The patient's mother died from complications of COPD at the age of 104. These has 2 brothers, one sister. Her sister developed breast cancer at the age of 11. She is doing well. The patient herself underwent genetic testing at [facility] in 2011 and was found to be BRCA negative  GYNECOLOGIC HISTORY:  Menarche age 61, she is GX P0. She stopped having periods with her initial chemotherapy in 2006.  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 Her husband [name] [name] is a Medical illustrator in [place]. He has a child from a prior marriage. At home they have 2 rescue dogs, Hobo and Imlay. The patient  is religious but not a church attender    ADVANCED DIRECTIVES: In place; at the 08/04/2014 visit in particular the patient was very clear, with her husband present, that she would not want any kind of feeding tubes or "other tubes" if her condition deteriorated.   HEALTH MAINTENANCE: History  Substance Use Topics  . Smoking status: Never Smoker   . Smokeless tobacco: Never Used  . Alcohol Use: No     Colonoscopy:  PAP:  Bone density: March 2015; mild osteopenia  Lipid panel:  Allergies  Allergen Reactions  . Decadron [Dexamethasone] Other (See Comments)    Patient does not tolerate steroids.   . Enoxaparin Other (See Comments)    unknown  . Fluconazole Swelling  . Hydromorphone Hcl Nausea And Vomiting  . Morphine And Related   . Tegaderm Ag Mesh [Silver]     Current Outpatient Prescriptions  Medication Sig Dispense Refill  . ALPRAZolam (XANAX) 0.5 MG tablet Take 1 tablet (0.5 mg total) by mouth 2 (two) times daily as needed  for anxiety. 30 tablet 0  . b complex vitamins capsule Take 1 capsule by mouth daily.    . Calcium-Magnesium-Vitamin D (CITRACAL CALCIUM+D PO) Take by mouth. 433m vitamin C, 5011mvitamin D3 2 PO DAILY    . cholecalciferol (VITAMIN D) 1000 UNITS tablet Take 1,000 Units by mouth daily. Takes 2,000 Iu daily    . exemestane (AROMASIN) 25 MG tablet Take 25 mg by mouth daily after breakfast.   4  . folic acid (FOLVITE) 1 MG tablet Take 1 mg by mouth daily.    . Marland Kitchenbuprofen (ADVIL,MOTRIN) 200 MG tablet Take 200 mg by mouth every 4 (four) hours as needed.    . Melatonin 1 MG TABS Take 3 mg by mouth at bedtime as needed (sleep).     . Marland KitchenxyCODONE (OXY IR/ROXICODONE) 5 MG immediate release tablet Take 1 tablet (5 mg total) by mouth every 4 (four) hours as needed for severe pain. 30 tablet 0  . palbociclib (IBRANCE) 75 MG capsule Take 1 capsule (75 mg total) by mouth daily with breakfast. Take whole with food. 11 capsule 4  . [DISCONTINUED] metoCLOPramide (REGLAN) 10  MG tablet Take 1 tablet (10 mg total) by mouth 4 (four) times daily -  before meals and at bedtime. 60 tablet 3   No current facility-administered medications for this visit.    OBJECTIVE: Middle-aged white woman who appears stated age Fi35itals:     There is no weight on file to calculate BMI.   Filed Vitals:    Patient refused vitals    ECOG FS:1 - Symptomatic but completely ambulatory  Sclerae unicteric, EOMs intact Oropharynx clear, dentition in good repair No cervical or supraclavicular adenopathy Lungs no rales or rhonchi Heart regular rate and rhythm Abd soft, nontender, positive bowel sounds MSK no focal spinal tenderness Neuro: nonfocal, well oriented, appropriate affect Breasts: Deferred   LAB RESULTS:   CMP     Component Value Date/Time   NA 139 09/04/2014 1318   NA 134* 09/03/2013 2145   K 3.8 09/04/2014 1318   K 3.7 09/03/2013 2145   CL 97 09/03/2013 2145   CL 105 05/06/2012 1333   CO2 26 09/04/2014 1318   CO2 23 09/03/2013 2145   GLUCOSE 87 09/04/2014 1318   GLUCOSE 121* 09/03/2013 2145   GLUCOSE 124* 05/06/2012 1333   BUN 12.1 09/04/2014 1318   BUN 8 09/03/2013 2145   CREATININE 0.8 09/04/2014 1318   CREATININE 0.64 09/03/2013 2145   CALCIUM 9.5 09/04/2014 1318   CALCIUM 9.0 09/03/2013 2145   PROT 7.1 09/04/2014 1318   PROT 6.8 09/03/2013 2145   ALBUMIN 3.6 09/04/2014 1318   ALBUMIN 3.3* 09/03/2013 2145   AST 27 09/04/2014 1318   AST 55* 09/03/2013 2145   ALT 47 09/04/2014 1318   ALT 85* 09/03/2013 2145   ALKPHOS 86 09/04/2014 1318   ALKPHOS 114 09/03/2013 2145   BILITOT 0.27 09/04/2014 1318   BILITOT 0.3 09/03/2013 2145   GFRNONAA >90 09/03/2013 2145   GFRAA >90 09/03/2013 2145    No results found for: SPEP  Lab Results  Component Value Date   WBC 5.5 09/04/2014   NEUTROABS 4.3 09/04/2014   HGB 13.5 09/04/2014   HCT 39.7 09/04/2014   MCV 93.3 09/04/2014   PLT 378 09/04/2014      Chemistry      Component Value Date/Time    NA 139 09/04/2014 1318   NA 134* 09/03/2013 2145   K 3.8 09/04/2014 1318   K  3.7 09/03/2013 2145   CL 97 09/03/2013 2145   CL 105 05/06/2012 1333   CO2 26 09/04/2014 1318   CO2 23 09/03/2013 2145   BUN 12.1 09/04/2014 1318   BUN 8 09/03/2013 2145   CREATININE 0.8 09/04/2014 1318   CREATININE 0.64 09/03/2013 2145      Component Value Date/Time   CALCIUM 9.5 09/04/2014 1318   CALCIUM 9.0 09/03/2013 2145   ALKPHOS 86 09/04/2014 1318   ALKPHOS 114 09/03/2013 2145   AST 27 09/04/2014 1318   AST 55* 09/03/2013 2145   ALT 47 09/04/2014 1318   ALT 85* 09/03/2013 2145   BILITOT 0.27 09/04/2014 1318   BILITOT 0.3 09/03/2013 2145       Lab Results  Component Value Date   LABCA2 25 11/02/2007    No components found for: SAYTK160  No results for input(s): INR in the last 168 hours.  Urinalysis    Component Value Date/Time   COLORURINE YELLOW 09/03/2013 2109   APPEARANCEUR CLEAR 09/03/2013 2109   LABSPEC 1.005 09/12/2013 1542   LABSPEC 1.016 09/03/2013 2109   PHURINE 6.0 09/12/2013 1542   PHURINE 6.0 09/03/2013 2109   GLUCOSEU Negative 09/12/2013 [place] 09/03/2013 2109   HGBUR Negative 09/12/2013 1542   HGBUR NEGATIVE 09/03/2013 2109   BILIRUBINUR Negative 09/12/2013 [place] 09/03/2013 2109   KETONESUR Negative 09/12/2013 [place] 09/03/2013 2109   PROTEINUR Negative 09/12/2013 [place] 09/03/2013 2109   UROBILINOGEN 0.2 09/12/2013 1542   UROBILINOGEN 0.2 09/03/2013 2109   NITRITE Negative 09/12/2013 1542   NITRITE NEGATIVE 09/03/2013 2109   LEUKOCYTESUR Negative 09/12/2013 1542   LEUKOCYTESUR NEGATIVE 09/03/2013 2109    STUDIES: Dg Chest 2 View  10/05/2014   CLINICAL DATA:  Serosanguineous blood from PleurX catheter on the left, history of breast carcinoma and chest wall pain  EXAM: CHEST  2 VIEW  COMPARISON:  CT chest of 09/04/2014 and chest x-ray of 06/19/2014  FINDINGS: Compared to the scout  film from the CT chest recently, there may be more left pleural effusion present with volume loss at the left lung base. The PleurX catheter extends toward the lung apex. No pneumothorax is seen. The right lung is clear. There appears be some discontinuity of the anterior left seventh rib on the frontal view and a fracture of the left anterior seventh rib cannot be excluded. In review of the CT of 09/04/2014, the lateral left seventh rib appears somewhat lytic, and a metastatic lesion is a definite consideration. Heart size is stable.  IMPRESSION: 1. Perhaps slight increase in volume of left pleural effusion with left basilar atelectasis. 2. The PleurX catheter extends into the left lung apex. 3. Cannot exclude fracture of the left anterior seventh rib. Also in review of the recent CT, a lytic lesion involving the lateral left seventh rib may represent a metastatic lesion.   Electronically Signed   By: Ivar Drape M.D.   On: 10/05/2014 16:24   Ct Angio Chest Pe W/cm &/or Wo Cm  10/12/2014   CLINICAL DATA:  STAT CALL REPORT. PATIENT WAITING. QUESTION PE WITH ACUTE SHORT OF BREATH.  EXAM: CT ANGIOGRAPHY CHEST WITH CONTRAST  TECHNIQUE: Multidetector CT imaging of the chest was performed using the standard protocol during bolus administration of intravenous contrast. Multiplanar CT image reconstructions and MIPs were obtained to evaluate the vascular anatomy.  CONTRAST:  127m OMNIPAQUE IOHEXOL 350 MG/ML SOLN  COMPARISON:  CT 09/04/2014  FINDINGS: Mediastinum/Nodes: No filling defects within the pulmonary suggest acute pulmonary embolism. No acute findings of the aorta great vessels. There is no pericardial fluid. Esophagus normal.  No axillary or supraclavicular lymphadenopathy. No mediastinal adenopathy. Esophagus normal.  Lungs/Pleura: There is a rind of pleural thickening within the right hemi thorax which is similar to comparison exam. This pleural thickening is decreased in thickness along the upper left  mediastinum measuring 17 mm compared to 29 mm on prior (image 36, series 4). At the medial left lung base nodular pleural thickening measures 2.4 cm compared to 2.6 cm. There is loculated pleural fluid within the left hemi thorax similar prior. There is rounded atelectasis of the right lower lobe which is also similar prior.  There are several small ground-glass nodules within the left upper lobe which are new from prior. For example 10 mm ground-glass nodule on image 28, series 7. Two nodules on image 28 and 34. There is a small bore catheter within the left hemi thorax with tip in the left anterior hemi thorax.  Upper abdomen: There is irregular nodules within the precordial space measuring 11 mm and 11 mm which are similar to 12 mm and 12 mm. There is nodularity within the upper abdomen along the crus of the diaphragm unchanged  Musculoskeletal: Sclerotic lesion associated with a left lateral rib stable.  Review of the MIP images confirms the above findings.  IMPRESSION: 1. No evidence acute pulmonary embolism. 2. Several new ground-glass nodules within the left upper lobe are concerning for early infection. 3. Extensive pleural nodularity within the right hemi thorax which appears mildly improved. 4. Stable loculated pleural fluid in the left hemi thorax with catheter in place. 5. Stable nodularity along the crus of the diaphragm in the upper abdomen as well as precordial nodules slightly improved. 6. Stable apparent left rib metastasis.   Electronically Signed   By: Suzy Bouchard M.D.   On: 10/12/2014 15:54    ASSESSMENT: 55 y.o. [place] woman with stage IV breast cancer, history as follows  (1)  S/p Right lumpectomy and sentinel lymph node sampling 03/15/2004 for a pT1c pN0. Stage IA invasive ductal carcinoma, grade 2, estrogen receptor 95% positive, progesterone receptor 65% positive, HER-2 not amplified; additional surgery 04/25/2004 for seroma or clearance showed no residual tumor  (2) adjuvant  chemotherapy with cyclophosphamide and doxorubicin every 21 days x4 completed 07/19/2004  (3) adjuvant radiation given under Dr. Donella Stade in [place] completed July 2006  (4) the patient opted against adjuvant antiestrogen therapy  (5) genetics testing showed no BRCA mutations  (6) biopsy of a palpable right axillary mass 10/24/2009 showed invasive ductal carcinoma, grade 3, estrogen receptor 100% positive, progesterone receptor 2% positive (alert score 5) HER-2 negative; no evidence of systemic disease on PET scanning  (7) completed 3 of 4 planned cycles of docetaxel and cyclophosphamide September 2011, fourth cycle omitted because of marked elevations in liver function tests  (8) an right axillary lymph node dissection 03/06/2010 showed 3/8 lymph nodes removed to be involved by tumor, with extracapsular extension.  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, TTS-1 negative), estrogen receptor 80% positive, progesterone receptor 2% positive, HER-2 not  amplified  (12) letrozole started March 2015-- tolerated with significant side effects, discontinued at the end of May 2015  (13) PET scan 08/16/2013 shows extensive left pleural metastatic disease and a large left pleural effusion that shifts cardiac and mediastinal structures to the right; adenopathy (celiac trunk, periadrenal, periaortic); and a left medial clavicular lesion; Status post left thoracentesis 08/16/2013 positive for adenocarcinoma, estrogen receptor positive, progesterone receptor negative.  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; everolimus was suggested as an available targeted agent  (18) exemestane started 03/31/2014  (a) everolimus added 04/03/2014 but not tolerated (cytopenias, elevated LFTs) even at minimal doses; stopped 04/17/2014    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 treated with Lovenox for 5-6 months  (c) right upper extremity lymphedema  (d) hepatic steatosis with chronically elevated LFTs as well as unusual hepatic sensitivity to chemotherapy  (e) osteopenia with the lowest T score -1.6 on bone density scan 06/20/2013  (f) radiation oncology (Dr Valere Dross) has reviewed prior radiation records in case there is further mediastinal involvement with dysphagia etc in which case palliative XRT could be considered  (a) radiation to left mediastinum/ left 7th rib 3250 cGy in 13 sessions04/18/2016 through 08/02/2014   PLAN: We reviewed the results of the CT scan in detail. It shows continuing response in the area where she had radiation. It does not show any further disease growth in the other areas.  I think what may be causing the sanguinous fluid is losing from the irradiated tissue. I think if we were able to look inside her long we would probably see denuded irradiated tissue which is at least partially necrotic. This has to, at somewhere. Her concern is that there is a little bit of blood but my concern is that the Pleuryx might get  clogged.  The CT scan does show a couple of small areas suggestive of early infection. I don't think it would hurt if she had antibiotics for a few days and I went ahead and prescribed a Z-Pak today.  We may want to stop draining the Pleuryx for a week or 2 and see just how much fluid reaccumulate. It may be that we are just continuing to chase our tail and if we stop draining at it will reaccumulate to some extent but then stop at a level that would allow her to function fairly normally. In that case we could remove the Pleuryx catheter.  She is planning to fly to [place] between July 22 and July 31. She has flown before, in the last few months, with no difficulty and did not require oxygen. She did not feel any different in the plane despite the fact that of course the pressure is less. We are trying to arrange for her to have the Pleuryx drained a couple of times while she is in [place]. I don't believe we have succeeded yet in accomplishing that.  The fact that there is some blood in the fluid should not make a difference to the drainage pattern and we are continuing as before,  which drainage every Monday and Thursday, usually 2 200 mL maximum so she doesn't get any more pain.  She would like to discuss her situation with a pulmonologist and I do think this is reasonable. I will see if Dr. Chase Caller is able to evaluate her. The point of course would be optimal management of her left lung situation  Otherwise she is already scheduled for a brain MRI next week, which I expect to be unremarkable, and a PET scan later on after which she will see me to discuss those results. Chauncey Cruel, MD   10/12/2014 4:20 PM

## 2014-10-12 NOTE — Telephone Encounter (Signed)
Received a call from pt with concerns that she is having some "bright red " blood draining from Pleurx. Communicated with Dr. Jana Hakim and pt will be seeing him @ 3:30p today. I called pt back and   informed her of this appt.

## 2014-10-13 ENCOUNTER — Telehealth: Payer: Self-pay | Admitting: *Deleted

## 2014-10-13 ENCOUNTER — Ambulatory Visit: Payer: 59 | Admitting: Physical Therapy

## 2014-10-13 ENCOUNTER — Telehealth: Payer: Self-pay | Admitting: Oncology

## 2014-10-13 DIAGNOSIS — I890 Lymphedema, not elsewhere classified: Secondary | ICD-10-CM | POA: Diagnosis not present

## 2014-10-13 NOTE — Telephone Encounter (Signed)
Pt aware of 9.1 appt at 10am with Dr. Chase Caller...that is the first available

## 2014-10-13 NOTE — Therapy (Signed)
[place], [place], [address] Phone: [phone]   Fax:  [phone]  Physical Therapy Treatment  Patient Details  Name: [name] [name] MRN: [id number] Date of Birth: 1961/01/19 Referring Provider:  Consuela Mimes, MD  Encounter Date: 10/13/2014      PT End of Session - 10/13/14 1204    Visit Number 44   Number of Visits 37   Date for PT Re-Evaluation 12/01/14   PT Start Time 7824   PT Stop Time 1100   PT Time Calculation (min) 45 min   Activity Tolerance Patient tolerated treatment well   Behavior During Therapy [facility] for tasks assessed/performed      Past Medical History  Diagnosis Date  . Seizures 2010    Isolated incident.  Marland Kitchen PONV (postoperative nausea and vomiting)   . Peripheral vascular disease 02/2010    blood clot related to porta cath  . Breast cancer dx'd 2005/2011  . Bone metastases dx'd 05/2014  . S/P radiation therapy 07/17/2014 through 08/02/2014     Left mediastinum, left seventh rib 3250 cGy in 13 sessions     Past Surgical History  Procedure Laterality Date  . Breast lumpectomy  2005  . Axillary lymph node dissection  Dec. 2011  . Portacath placement  12/11  . Removal portacath    . Mediastinotomy chamberlain mcneil Left 06/02/2013    Procedure: MEDIASTINOTOMY CHAMBERLAIN MCNEIL;  Surgeon: [name], MD;  Location: [place];  Service: Thoracic;  Laterality: Left;  LEFT ANTERIOR MEDIASTINOTOMY     There were no vitals filed for this visit.  Visit Diagnosis:  Lymphedema  Muscle stiffness  Other chest pain  Intermittent low back pain      Subjective Assessment - 10/13/14 1202    Subjective pt has a CT yesterday and found that she has small bacterial infection in lung. and the blood in pleurex is coming from that tumors that are getting smaller, but are bleeding.  She is  deciding if she should remove the pleurex tube   Currently in Pain? Yes   Pain Score 5                          [facility] Adult PT Treatment/Exercise - 10/13/14 0001    Manual Therapy   Myofascial Release In supine, soft tissue mobilization and release at right axilla.   Manual Lymphatic Drainage (MLD) In left sidelying, posterior interaxillary anastomosis and right axillo-inguinal anastomosis; in supine, short neck, left axilla and anterior interaxillary anastomosis, right groin and axillo-inguinal anastomosis, area between right breast scars, directing toward pathways, and right upper arm.  In right sidelying, left periscapular area toward left groin.   Manual Therapy Other (comment)  In Rt. sidelying, soft tissue work to left back, neck                         Long Term Clinic Goals - 10/09/14 1203    CC Long Term Goal  #1   Status On-going   CC Long Term Goal  #2   Status On-going            Plan - 10/13/14 1205    Clinical Impression Statement pt reported some relief after session today, with softening of tissue palpabel   PT Next Visit Plan Manual lymph drainage, myofascial release, soft tissue work.        Problem List Patient Active Problem List  Diagnosis Date Noted  . 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 Chest wall pain 03/21/2014  . Abnormal LFTs (liver function tests) 09/12/2013  . Pleural effusion 08/18/2013  . 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 Owens Shark, PT   10/13/2014, 12:06 PM  [place], [place], [address] Phone: [phone]   Fax:  [phone]

## 2014-10-13 NOTE — Telephone Encounter (Signed)
TC from patient reagarding a call she received today from radiology scheduling about scheduling her for a CT scan. Pt stats she just had one done yesterday. She is asking if Dr. Jana Hakim wants her to have another one. She is scheduled for brain MRI on 10/18/14 and PET scan on 10/30/14. Please advise

## 2014-10-16 ENCOUNTER — Ambulatory Visit: Payer: 59

## 2014-10-16 DIAGNOSIS — M545 Low back pain, unspecified: Secondary | ICD-10-CM

## 2014-10-16 DIAGNOSIS — I890 Lymphedema, not elsewhere classified: Secondary | ICD-10-CM | POA: Diagnosis not present

## 2014-10-16 DIAGNOSIS — M6289 Other specified disorders of muscle: Secondary | ICD-10-CM

## 2014-10-16 DIAGNOSIS — R0789 Other chest pain: Secondary | ICD-10-CM

## 2014-10-16 NOTE — Therapy (Signed)
[place], [place], [address] Phone: [phone]   Fax:  [phone]  Physical Therapy Treatment  Patient Details  Name: [name] [name] MRN: [id number] Date of Birth: 1960/04/28 Referring Provider:  Chauncey Cruel, MD  Encounter Date: 10/16/2014      PT End of Session - 10/16/14 1221    Visit Number 65   Number of Visits 48   Date for PT Re-Evaluation 12/01/14   PT Start Time 1016   PT Stop Time 1101   PT Time Calculation (min) 45 min   Activity Tolerance Patient tolerated treatment well   Behavior During Therapy [facility] for tasks assessed/performed      Past Medical History  Diagnosis Date  . Seizures 2010    Isolated incident.  Marland Kitchen PONV (postoperative nausea and vomiting)   . Peripheral vascular disease 02/2010    blood clot related to porta cath  . Breast cancer dx'd 2005/2011  . Bone metastases dx'd 05/2014  . S/P radiation therapy 07/17/2014 through 08/02/2014     Left mediastinum, left seventh rib 3250 cGy in 13 sessions     Past Surgical History  Procedure Laterality Date  . Breast lumpectomy  2005  . Axillary lymph node dissection  Dec. 2011  . Portacath placement  12/11  . Removal portacath    . Mediastinotomy chamberlain mcneil Left 06/02/2013    Procedure: MEDIASTINOTOMY CHAMBERLAIN MCNEIL;  Surgeon: [name], MD;  Location: [facility];  Service: Thoracic;  Laterality: Left;  LEFT ANTERIOR MEDIASTINOTOMY     There were no vitals filed for this visit.  Visit Diagnosis:  Lymphedema  Muscle stiffness  Other chest pain  Intermittent low back pain      Subjective Assessment - 10/16/14 1217    Subjective Finish my Zpac tomorrow. They are still getting about 550 cc's of fluid out of my drain but it seems to be on the mend. Will see a lymphedema therapist when in [place] next week.  Having some pain at both axilla today.     Currently in Pain? Yes   Pain Score 5    Pain Location Axilla   Pain Orientation Right   Pain Descriptors / Indicators Constant   Pain Type Acute pain   Pain Onset More than a month ago   Pain Frequency Constant   Aggravating Factors  swelling   Pain Relieving Factors manual lymph drainage   Multiple Pain Sites Yes   Multiple Pain Sites Yes   Pain Score 4   Pain Location Axilla   Pain Orientation Left                         [facility] Adult PT Treatment/Exercise - 10/16/14 0001    Manual Therapy   Myofascial Release In supine, soft tissue mobilization and release at right axilla.   Manual Lymphatic Drainage (MLD) In left sidelying, posterior interaxillary anastomosis and right axillo-inguinal anastomosis; in supine, short neck, left axilla and anterior interaxillary anastomosis, right groin and axillo-inguinal anastomosis, area between right breast scars, directing toward pathways, and right upper arm.  In right sidelying, left periscapular area toward left groin.   Manual Therapy Other (comment)  In Rt S/L soft tissue work to [employer] and back                        [facility] Goals - 10/09/14 1203    [place]  Term Goal  #1   Status On-going   CC Long Term Goal  #2   Status On-going            Plan - 10/16/14 1222    Clinical Impression Statement Patient reports tissue is tighter after weekend right now due to the fluid being backed up from drains. Reported releif after session today and tissue continues to be palpably softer after visit.    Pt will benefit from skilled therapeutic intervention in order to improve on the following deficits Increased edema;Pain;Increased fascial restricitons   Rehab Potential Good   Clinical Impairments Affecting Rehab Potential active cancer   PT Frequency 2x / week   PT Duration 12 weeks   PT Treatment/Interventions Manual lymph drainage;Manual techniques   PT  Next Visit Plan Assess next visit. Manual lymph drainage, myofascial release, soft tissue work.   Consulted and Agree with Plan of Care Patient        Problem List Patient Active Problem List   Diagnosis Date Noted  . 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 Chest wall pain 03/21/2014  . Abnormal LFTs (liver function tests) 09/12/2013  . Pleural effusion 08/18/2013  . 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 Secondary malignant neoplasm of mediastinal lymph node 08/18/2013    Otelia Limes, PTA 10/16/2014, 12:24 PM  [place], [place], [address] Phone: [phone]   Fax:  [phone]

## 2014-10-17 ENCOUNTER — Telehealth (HOSPITAL_COMMUNITY): Payer: Self-pay | Admitting: Radiology

## 2014-10-17 ENCOUNTER — Other Ambulatory Visit: Payer: Self-pay | Admitting: *Deleted

## 2014-10-17 NOTE — Telephone Encounter (Signed)
This RN contacted outpt home health with [facility] in [place], [place] at [phone].  Per discussion of pt's need for services for PluerX catheter drainage while in area was informed due to oupt services they do not accept [facility]- phone number for [facility] given who does accept pt's policy.  This RN contacted above facility at [phone] and spoke with Harle Battiest- she requested an order with pt's demographics including location where she will residing while in area.  Faxed above to [phone].  Harle Battiest will have home health nurses review for appropriate scheduling and return call to this RN.

## 2014-10-18 ENCOUNTER — Ambulatory Visit (HOSPITAL_COMMUNITY): Admission: RE | Admit: 2014-10-18 | Payer: 59 | Source: Ambulatory Visit

## 2014-10-18 ENCOUNTER — Ambulatory Visit (HOSPITAL_BASED_OUTPATIENT_CLINIC_OR_DEPARTMENT_OTHER): Payer: 59

## 2014-10-18 ENCOUNTER — Ambulatory Visit (HOSPITAL_COMMUNITY)
Admission: RE | Admit: 2014-10-18 | Discharge: 2014-10-18 | Disposition: A | Discharge status: Home / Self Care | Payer: 59 | Source: Ambulatory Visit | Attending: Oncology | Admitting: Oncology

## 2014-10-18 DIAGNOSIS — Z853 Personal history of malignant neoplasm of breast: Secondary | ICD-10-CM | POA: Insufficient documentation

## 2014-10-18 DIAGNOSIS — R569 Unspecified convulsions: Secondary | ICD-10-CM | POA: Insufficient documentation

## 2014-10-18 DIAGNOSIS — R945 Abnormal results of liver function studies: Secondary | ICD-10-CM

## 2014-10-18 DIAGNOSIS — C50211 Malignant neoplasm of upper-inner quadrant of right female breast: Secondary | ICD-10-CM

## 2014-10-18 DIAGNOSIS — R7989 Other specified abnormal findings of blood chemistry: Secondary | ICD-10-CM

## 2014-10-18 DIAGNOSIS — C7951 Secondary malignant neoplasm of bone: Secondary | ICD-10-CM | POA: Diagnosis not present

## 2014-10-18 DIAGNOSIS — C771 Secondary and unspecified malignant neoplasm of intrathoracic lymph nodes: Secondary | ICD-10-CM

## 2014-10-18 DIAGNOSIS — C50411 Malignant neoplasm of upper-outer quadrant of right female breast: Secondary | ICD-10-CM

## 2014-10-18 DIAGNOSIS — J90 Pleural effusion, not elsewhere classified: Secondary | ICD-10-CM

## 2014-10-18 MED ORDER — GADOBENATE DIMEGLUMINE 529 MG/ML IV SOLN
15.0000 mL | Freq: Once | INTRAVENOUS | Status: AC | PRN
  Administered 2014-10-18: 15 mL via INTRAVENOUS

## 2014-10-18 NOTE — Progress Notes (Signed)
Patient needs IV start for Brain MRI.  Patient does not want hand used.  She refuses VS.

## 2014-10-19 ENCOUNTER — Telehealth: Payer: Self-pay | Admitting: *Deleted

## 2014-10-19 ENCOUNTER — Ambulatory Visit: Payer: 59

## 2014-10-19 DIAGNOSIS — M545 Low back pain, unspecified: Secondary | ICD-10-CM

## 2014-10-19 DIAGNOSIS — I890 Lymphedema, not elsewhere classified: Secondary | ICD-10-CM

## 2014-10-19 DIAGNOSIS — R0789 Other chest pain: Secondary | ICD-10-CM

## 2014-10-19 DIAGNOSIS — M6289 Other specified disorders of muscle: Secondary | ICD-10-CM

## 2014-10-19 NOTE — Telephone Encounter (Signed)
This RN was able with assistance of patient communicate and arrange for home health services with [facility] in [place] per [place] with this facility.  Orders faxed to above.

## 2014-10-19 NOTE — Therapy (Signed)
[place], [place], [address] Phone: [phone]   Fax:  [phone]  Physical Therapy Treatment  Patient Details  Name: VARINA HULON MRN: [id number] Date of Birth: [birth year] Referring Provider:  Chauncey Cruel, MD  Encounter Date: 10/19/2014      PT End of Session - 10/19/14 0935    Visit Number 64   Number of Visits 69   Date for PT Re-Evaluation 12/01/14   PT Start Time 0850   PT Stop Time 0931   PT Time Calculation (min) 41 min   Activity Tolerance Patient tolerated treatment well   Behavior During Therapy [facility] for tasks assessed/performed      Past Medical History  Diagnosis Date  . Seizures 2010    Isolated incident.  Marland Kitchen PONV (postoperative nausea and vomiting)   . Peripheral vascular disease 02/2010    blood clot related to porta cath  . Breast cancer dx'd 2005/2011  . Bone metastases dx'd 05/2014  . S/P radiation therapy 07/17/2014 through 08/02/2014     Left mediastinum, left seventh rib 3250 cGy in 13 sessions     Past Surgical History  Procedure Laterality Date  . Breast lumpectomy  2005  . Axillary lymph node dissection  Dec. 2011  . Portacath placement  12/11  . Removal portacath    . Mediastinotomy chamberlain mcneil Left 06/02/2013    Procedure: MEDIASTINOTOMY CHAMBERLAIN MCNEIL;  Surgeon: [name], MD;  Location: [place] [facility];  Service: Thoracic;  Laterality: Left;  LEFT ANTERIOR MEDIASTINOTOMY     There were no vitals filed for this visit.  Visit Diagnosis:  Lymphedema  Muscle stiffness  Other chest pain  Intermittent low back pain      Subjective Assessment - 10/19/14 0852    Subjective I have a scab around the drain site on my Lt side and it just stays irritated.    Currently in Pain? Yes   Pain Score 4    Pain Location Axilla   Pain Orientation Right  And left  today   Pain Descriptors / Indicators Constant   Pain Type Acute pain   Pain Onset More than a month ago   Aggravating Factors  drain; swelling   Pain Relieving Factors manual lymph drainage                         [facility] Adult PT Treatment/Exercise - 10/19/14 0001    Manual Therapy   Myofascial Release In supine, soft tissue mobilization and release at right axilla.   Manual Lymphatic Drainage (MLD) In left sidelying,right inguinal nodes and right axillo-inguinal anastomosis; in supine, short neck, left axilla and anterior interaxillary anastomosis, right groin and axillo-inguinal anastomosis, area between right breast scars, directing toward pathways, and right upper arm.  In right sidelying, left periscapular area toward left groin.   Manual Therapy Other (comment)  In Rt S/L soft tissue work to [employer] and back                        [place] - 10/09/14 1203    CC Long Term Goal  #1   Status On-going   CC Long Term Goal  #2   Status On-going            Plan - 10/19/14 0936    Clinical Impression Statement Some improvement noted from last visit. Pt reported frustration at how her appointments  arenet finished being set up yet for her drains being taken care of in [place], but thinks it will be completed.    Pt will benefit from skilled therapeutic intervention in order to improve on the following deficits Increased edema;Pain;Increased fascial restricitons   Rehab Potential Good   Clinical Impairments Affecting Rehab Potential active cancer   PT Frequency 2x / week   PT Duration 12 weeks   PT Treatment/Interventions Manual lymph drainage;Manual techniques   PT Next Visit Plan Assess next visit. Manual lymph drainage, myofascial release, soft tissue work.   Consulted and Agree with Plan of Care Patient        Problem List Patient Active Problem List   Diagnosis Date Noted  . Post-lymphadenectomy lymphedema of arm  05/31/2014  . Chest wall pain 03/21/2014  . Abnormal LFTs (liver function tests) 09/12/2013  . Pleural effusion 08/18/2013  . 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 Secondary malignant neoplasm of mediastinal lymph node 08/18/2013    [name] [name], PTA 10/19/2014, 9:39 AM  [place], [place], [address] Phone: [phone]   Fax:  [phone]

## 2014-10-20 ENCOUNTER — Encounter: Payer: Self-pay | Admitting: Physical Therapy

## 2014-10-27 ENCOUNTER — Encounter: Payer: Self-pay | Admitting: Physical Therapy

## 2014-10-30 ENCOUNTER — Other Ambulatory Visit: Payer: Self-pay | Admitting: *Deleted

## 2014-10-30 ENCOUNTER — Other Ambulatory Visit (HOSPITAL_COMMUNITY): Payer: Self-pay

## 2014-10-30 ENCOUNTER — Ambulatory Visit (HOSPITAL_COMMUNITY)
Admission: RE | Admit: 2014-10-30 | Discharge: 2014-10-30 | Disposition: A | Discharge status: Home / Self Care | Payer: 59 | Source: Ambulatory Visit | Attending: Oncology | Admitting: Oncology

## 2014-10-30 ENCOUNTER — Other Ambulatory Visit (HOSPITAL_BASED_OUTPATIENT_CLINIC_OR_DEPARTMENT_OTHER): Payer: 59

## 2014-10-30 ENCOUNTER — Ambulatory Visit: Payer: 59 | Attending: Oncology | Admitting: Physical Therapy

## 2014-10-30 ENCOUNTER — Ambulatory Visit (HOSPITAL_BASED_OUTPATIENT_CLINIC_OR_DEPARTMENT_OTHER): Payer: Self-pay

## 2014-10-30 DIAGNOSIS — C778 Secondary and unspecified malignant neoplasm of lymph nodes of multiple regions: Secondary | ICD-10-CM

## 2014-10-30 DIAGNOSIS — C782 Secondary malignant neoplasm of pleura: Secondary | ICD-10-CM

## 2014-10-30 DIAGNOSIS — C771 Secondary and unspecified malignant neoplasm of intrathoracic lymph nodes: Secondary | ICD-10-CM

## 2014-10-30 DIAGNOSIS — C50411 Malignant neoplasm of upper-outer quadrant of right female breast: Secondary | ICD-10-CM | POA: Diagnosis not present

## 2014-10-30 DIAGNOSIS — C50211 Malignant neoplasm of upper-inner quadrant of right female breast: Secondary | ICD-10-CM

## 2014-10-30 DIAGNOSIS — M545 Low back pain, unspecified: Secondary | ICD-10-CM

## 2014-10-30 DIAGNOSIS — R918 Other nonspecific abnormal finding of lung field: Secondary | ICD-10-CM | POA: Insufficient documentation

## 2014-10-30 DIAGNOSIS — J90 Pleural effusion, not elsewhere classified: Secondary | ICD-10-CM

## 2014-10-30 DIAGNOSIS — G729 Myopathy, unspecified: Secondary | ICD-10-CM | POA: Diagnosis present

## 2014-10-30 DIAGNOSIS — M6289 Other specified disorders of muscle: Secondary | ICD-10-CM

## 2014-10-30 DIAGNOSIS — R7989 Other specified abnormal findings of blood chemistry: Secondary | ICD-10-CM | POA: Diagnosis not present

## 2014-10-30 DIAGNOSIS — I890 Lymphedema, not elsewhere classified: Secondary | ICD-10-CM | POA: Insufficient documentation

## 2014-10-30 DIAGNOSIS — M549 Dorsalgia, unspecified: Secondary | ICD-10-CM | POA: Diagnosis present

## 2014-10-30 DIAGNOSIS — R0789 Other chest pain: Secondary | ICD-10-CM | POA: Diagnosis present

## 2014-10-30 DIAGNOSIS — R945 Abnormal results of liver function studies: Secondary | ICD-10-CM

## 2014-10-30 LAB — COMPREHENSIVE METABOLIC PANEL (CC13)
ALT: 38 U/L (ref 0–55)
ANION GAP: 9 meq/L (ref 3–11)
AST: 30 U/L (ref 5–34)
Albumin: 3.8 g/dL (ref 3.5–5.0)
Alkaline Phosphatase: 92 U/L (ref 40–150)
BUN: 10.8 mg/dL (ref 7.0–26.0)
CO2: 27 mEq/L (ref 22–29)
Calcium: 10.2 mg/dL (ref 8.4–10.4)
Chloride: 104 mEq/L (ref 98–109)
Creatinine: 0.8 mg/dL (ref 0.6–1.1)
EGFR: 84 mL/min/{1.73_m2} — AB (ref >90)
GLUCOSE: 88 mg/dL (ref 70–140)
POTASSIUM: 4.4 meq/L (ref 3.5–5.1)
Sodium: 140 mEq/L (ref 136–145)
Total Bilirubin: 0.4 mg/dL (ref 0.20–1.20)
Total Protein: 7.4 g/dL (ref 6.4–8.3)

## 2014-10-30 LAB — CBC WITH DIFFERENTIAL/PLATELET
BASO%: 0.4 % (ref 0.0–2.0)
BASOS ABS: 0 10*3/uL (ref 0.0–0.1)
EOS%: 1.6 % (ref 0.0–7.0)
Eosinophils Absolute: 0.1 10*3/uL (ref 0.0–0.5)
HEMATOCRIT: 40.4 % (ref 34.8–46.6)
HGB: 13.7 g/dL (ref 11.6–15.9)
LYMPH%: 9.4 % — ABNORMAL LOW (ref 14.0–49.7)
MCH: 32 pg (ref 25.1–34.0)
MCHC: 33.9 g/dL (ref 31.5–36.0)
MCV: 94.3 fL (ref 79.5–101.0)
MONO#: 0.5 10*3/uL (ref 0.1–0.9)
MONO%: 7.5 % (ref 0.0–14.0)
NEUT#: 5.9 10*3/uL (ref 1.5–6.5)
NEUT%: 81.1 % — ABNORMAL HIGH (ref 38.4–76.8)
Platelets: 392 10*3/uL (ref 145–400)
RBC: 4.29 10*6/uL (ref 3.70–5.45)
RDW: 13.4 % (ref 11.2–14.5)
WBC: 7.2 10*3/uL (ref 3.9–10.3)
lymph#: 0.7 10*3/uL — ABNORMAL LOW (ref 0.9–3.3)

## 2014-10-30 MED ORDER — FLUDEOXYGLUCOSE F - 18 (FDG) INJECTION
8.0000 | Freq: Once | INTRAVENOUS | Status: AC | PRN
  Administered 2014-10-30: 8 via INTRAVENOUS

## 2014-10-30 NOTE — Progress Notes (Signed)
Pt arrived to infusion room for IV start for PET scan.  Pt very concerned with Pleur-X and wishing to speak to Erline Levine, RN on MD side about potential need for CT of abdomen.  Erline Levine, RN notified and pt sent to waiting area to be assessed after IV initiation.  Pt refused vital signs during visit.  Pt left in no distress and all questions/concerns addressed.

## 2014-10-30 NOTE — Therapy (Signed)
[place], [place], [address] Phone: [phone]   Fax:  [phone]  Physical Therapy Treatment  Patient Details  Name: [name] [name] MRN: [id number] Date of Birth: 10/16/1960 Referring Provider:  Chauncey Cruel, MD  Encounter Date: 10/30/2014      PT End of Session - 10/30/14 1242    Visit Number 60   Number of Visits 104   Date for PT Re-Evaluation 12/01/14   PT Start Time 0855   PT Stop Time 0938   PT Time Calculation (min) 43 min   Activity Tolerance Patient tolerated treatment well   Behavior During Therapy [facility] for tasks assessed/performed      Past Medical History  Diagnosis Date  . Seizures 2010    Isolated incident.  Marland Kitchen PONV (postoperative nausea and vomiting)   . Peripheral vascular disease 02/2010    blood clot related to porta cath  . Breast cancer dx'd 2005/2011  . Bone metastases dx'd 05/2014  . S/P radiation therapy 07/17/2014 through 08/02/2014     Left mediastinum, left seventh rib 3250 cGy in 13 sessions     Past Surgical History  Procedure Laterality Date  . Breast lumpectomy  2005  . Axillary lymph node dissection  Dec. 2011  . Portacath placement  12/11  . Removal portacath    . Mediastinotomy chamberlain mcneil Left 06/02/2013    Procedure: MEDIASTINOTOMY CHAMBERLAIN MCNEIL;  Surgeon: [name], MD;  Location: [facility];  Service: Thoracic;  Laterality: Left;  LEFT ANTERIOR MEDIASTINOTOMY     There were no vitals filed for this visit.  Visit Diagnosis:  Lymphedema  Muscle stiffness  Other chest pain  Intermittent low back pain      Subjective Assessment - 10/30/14 0858    Subjective The therapist who saw me in [place] had a firmer technique.   Currently in Pain? Yes   Pain Score 6    Pain Location Axilla  + left flank   Pain Orientation Right   Aggravating  Factors  swelling   Pain Relieving Factors manual lymph drainage   Multiple Pain Sites Yes                         [facility] Adult PT Treatment/Exercise - 10/30/14 0001    Manual Therapy   Myofascial Release In supine, soft tissue mobilization and release at right axilla.   Manual Lymphatic Drainage (MLD) In left sidelying, posterior interaxillary anastomosis and right axillo-inguinal anastomosis; in supine, short neck, left axilla and anterior interaxillary anastomosis, right groin and axillo-inguinal anastomosis, area between right breast scars, directing toward pathways, and right upper arm.  In right sidelying, left periscapular area toward left groin.   Manual Therapy Other (comment)  In Rt. sidelying, soft tissue work to left back, neck                         Long Term Clinic Goals - 10/30/14 1246    CC Long Term Goal  #1   Status On-going   CC Long Term Goal  #2   Status On-going            Plan - 10/30/14 1242    Clinical Impression Statement Patient was away last week and had two treatments in [place].  She has the sense that treatment was less effective than at other times.  She does have palpable swelling and induration at right superior  lateral breast today at start of treatment, which was palpably softer at end of treatmnet.   Pt will benefit from skilled therapeutic intervention in order to improve on the following deficits Increased edema;Pain;Increased fascial restricitons   Rehab Potential Good   Clinical Impairments Affecting Rehab Potential active cancer   PT Frequency 2x / week   PT Duration 12 weeks   PT Treatment/Interventions Manual lymph drainage;Manual techniques   PT Next Visit Plan Manual lymph drainage, myofascial release, soft tissue work.   Consulted and Agree with Plan of Care Patient        Problem List Patient Active Problem List   Diagnosis Date Noted  . 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  Chest wall pain 03/21/2014  . Abnormal LFTs (liver function tests) 09/12/2013  . Pleural effusion 08/18/2013  . 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

## 2014-10-31 ENCOUNTER — Ambulatory Visit (HOSPITAL_BASED_OUTPATIENT_CLINIC_OR_DEPARTMENT_OTHER): Payer: 59 | Admitting: Oncology

## 2014-10-31 ENCOUNTER — Encounter: Payer: Self-pay | Admitting: Physical Therapy

## 2014-10-31 ENCOUNTER — Telehealth: Payer: Self-pay | Admitting: Oncology

## 2014-10-31 DIAGNOSIS — J90 Pleural effusion, not elsewhere classified: Secondary | ICD-10-CM | POA: Diagnosis not present

## 2014-10-31 DIAGNOSIS — C771 Secondary and unspecified malignant neoplasm of intrathoracic lymph nodes: Secondary | ICD-10-CM

## 2014-10-31 DIAGNOSIS — C50211 Malignant neoplasm of upper-inner quadrant of right female breast: Secondary | ICD-10-CM

## 2014-10-31 NOTE — Telephone Encounter (Signed)
Gave avs & calendar for August/Sept

## 2014-10-31 NOTE — Progress Notes (Signed)
[place]  [phone] [phone]     ID: [name] OB: 03-24-1961  MR#: [id number]  MGQ#:[id number]  PCP: Pcp Not In System GYN:  [name] SU:  OTHER MD: [name], [place], [name], [name], [name], [name]  CHIEF COMPLAINT: Stage IV breast cancer  CURRENT TREATMENT: Fulvestrant  BREAST CANCER HISTORY: From doctor [name]'s intake note 03/20/2004:  "The patient is a very pleasant 54 year old female, without significant past medical history.  Her family history is significant for a sister who at age 21 was diagnosed with invasive ductal carcinoma.  She is a breast cancer survivor at age 35 now.  The patient states that she has never really had a screening mammogram until October 2005, when she felt that it was time for her to start having mammograms done on a yearly basis.  Therefore, on 01/26/04, she underwent a screening mammogram and an abnormality was detected in the upper outer right breast.  She, therefore, underwent spot compression views of both the right and the left breast.  The left breast revealed a well-defined mass in the upper outer left quadrant, present at the 2 o'clock position, measuring 1.8 cm, 6 cm from the nipple.  This, by ultrasound, was felt to be a simple cyst measuring 1.8 cm.  On the right breast, a spiculated mass was noted in the upper outer right quadrant.  The ultrasound revealed a shadowing irregular solid mass at the 10:30 position, 9 cm from the nipple, measuring 1.2 cm in greatest dimension, correlating with the spiculated mass seen on the mammogram.  The right axilla was negative ultrasonically.  Because of this, the patient underwent a needle biopsy of the right breast and the biopsy was positive invasive mammary carcinoma that showed features consistent with a high-grade invasive ductal carcinoma associated with desmoplastic stroma.  No in situ  component was seen and no definite lymphovascular invasion was identified.  On the core biopsy, the tumor measured about 0.8 cm.  Because of this, she was seen by Dr. [name] and the patient was taken to the [facility] on March 15, 2004.  She underwent a right breast lumpectomy with sentinel node biopsy.  The final pathology revealed an invasive ductal carcinoma, measuring 1.7 cm, grade 2 of 3.  Margins were free of tumor.  Atypical lobular hyperplasia was noted.  One sentinel node was removed which was negative for metastatic disease.  The tumor was staged at T1c, N0 MX.  It was estrogen receptor positive, progesterone receptor positive.  HER-2/neu was 2+.  FISH was negative.  All margins were free of tumor.  She is now seen in Medical Oncology for further evaluation and management of this newly diagnosed T1c, node negative, stage I, invasive ductal carcinoma of the right breast."  Her subsequent history is as detailed below  INTERVAL HISTORY: Leontine returns today for an unscheduled visit accompanied by her husband [name]. Since the last visit here she went on a family trip to [place]. We had a great deal of difficulty arranging for left Pleurx drainage there, but it got done and although she was very excited, she enjoyed the trip. Aside from that, since her last visit here she was restaged with a brain MRI and a PET scan. These show a good response to the prior radiation, but some evidence of disease progression. 2 specific areas of concern are the left T11 neural foramen and the left parietal skull. There is also  a new area of uptake in the right lung and some areas of consolidation in the left lung which may or may not be postinfectious or post radiation.   REVIEW OF SYSTEMS: Zoila complains of diarrhea, which she attributes to the exemestane. She is very fatigued. She has pain sometimes very sharp on the right side, lasting up to 15 minutes at a time. She has significant lymphedema problems chiefly  involving the right trunk area she nevertheless tries to exercise as best she can and to be active during the day. A detailed review of systems today was otherwise stable  PAST MEDICAL HISTORY: Past Medical History  Diagnosis Date  . Seizures 2010    Isolated incident.  Marland Kitchen PONV (postoperative nausea and vomiting)   . Peripheral vascular disease 02/2010    blood clot related to porta cath  . Breast cancer dx'd 2005/2011  . Bone metastases dx'd 05/2014  . S/P radiation therapy 07/17/2014 through 08/02/2014     Left mediastinum, left seventh rib 3250 cGy in 13 sessions     PAST SURGICAL HISTORY: Past Surgical History  Procedure Laterality Date  . Breast lumpectomy  2005  . Axillary lymph node dissection  Dec. 2011  . Portacath placement  12/11  . Removal portacath    . Mediastinotomy chamberlain mcneil Left 06/02/2013    Procedure: MEDIASTINOTOMY CHAMBERLAIN MCNEIL;  Surgeon: [name], MD;  Location: [facility] OR;  Service: Thoracic;  Laterality: Left;  LEFT ANTERIOR MEDIASTINOTOMY     FAMILY HISTORY Family History  Problem Relation Age of Onset  . COPD Mother   . Breast cancer Sister 7   The patient's father is living, 64 years old as of may 2015. He lives in [place]. The patient's mother died from complications of COPD at the age of 36. These has 2 brothers, one sister. Her sister developed breast cancer at the age of 68. She is doing well. The patient herself underwent genetic testing at [facility] in 2011 and was found to be BRCA negative  GYNECOLOGIC HISTORY:  Menarche age 84, she is GX P0. She stopped having periods with her initial chemotherapy in 2006.  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 Her husband [name] is a Medical illustrator in [place]. He has a child from a prior marriage. At home they have 2 rescue dogs, Hobo and  [place]. The patient is religious but not a church attender    ADVANCED DIRECTIVES: In place; at the 08/04/2014 visit in particular the patient was very clear, with her husband present, that she would not want any kind of feeding tubes or "other tubes" if her condition deteriorated.   HEALTH MAINTENANCE: History  Substance Use Topics  . Smoking status: Never Smoker   . Smokeless tobacco: Never Used  . Alcohol Use: No     Colonoscopy:  PAP:  Bone density: March 2015; mild osteopenia  Lipid panel:  Allergies  Allergen Reactions  . Decadron [Dexamethasone] Other (See Comments)    Patient does not tolerate steroids.   . Enoxaparin Other (See Comments)    unknown  . Fluconazole Swelling  . Hydromorphone Hcl Nausea And Vomiting  . Morphine And Related   . Tegaderm Ag Mesh [Silver]     Current Outpatient Prescriptions  Medication Sig Dispense Refill  . ALPRAZolam (XANAX) 0.5 MG tablet Take 1 tablet (0.5 mg total) by mouth 2 (two) times daily as needed for anxiety. [place]  tablet 0  . b complex vitamins capsule Take 1 capsule by mouth daily.    . Calcium-Magnesium-Vitamin D (CITRACAL CALCIUM+D PO) Take by mouth. 449m vitamin C, 5071mvitamin D3 2 PO DAILY    . cholecalciferol (VITAMIN D) 1000 UNITS tablet Take 1,000 Units by mouth daily. Takes 2,000 Iu daily    . exemestane (AROMASIN) 25 MG tablet Take 25 mg by mouth daily after breakfast.   4  . folic acid (FOLVITE) 1 MG tablet Take 1 mg by mouth daily.    . Marland Kitchenbuprofen (ADVIL,MOTRIN) 200 MG tablet Take 200 mg by mouth every 4 (four) hours as needed.    . Melatonin 1 MG TABS Take 3 mg by mouth at bedtime as needed (sleep).     . Marland KitchenxyCODONE (OXY IR/ROXICODONE) 5 MG immediate release tablet Take 1 tablet (5 mg total) by mouth every 4 (four) hours as needed for severe pain. 30 tablet 0  . [DISCONTINUED] metoCLOPramide (REGLAN) 10 MG tablet Take 1 tablet (10 mg total) by mouth 4 (four) times daily -  before meals and at bedtime. 60 tablet 3    No current facility-administered medications for this visit.    OBJECTIVE: Middle-aged white woman who refuses weight and vital signs There were no vitals filed for this visit.   There is no weight on file to calculate BMI.   There were no vitals filed for this visit.  Patient refused vitals    ECOG FS:1 - Symptomatic but completely ambulatory  Sclerae unicteric, pupils round and equal Oropharynx clear and moist-- no thrush or other lesions No cervical or supraclavicular adenopathy Lungs show coarse rhonchi in the left lower field Heart regular rate and rhythm Abd soft, nontender, positive bowel sounds MSK no focal spinal tenderness, no upper extremity lymphedema Neuro: nonfocal, well oriented, appropriate affect Breasts: Deferred    LAB RESULTS:   CMP     Component Value Date/Time   NA 140 10/30/2014 1144   NA 134* 09/03/2013 2145   K 4.4 10/30/2014 1144   K 3.7 09/03/2013 2145   CL 97 09/03/2013 2145   CL 105 05/06/2012 1333   CO2 27 10/30/2014 1144   CO2 23 09/03/2013 2145   GLUCOSE 88 10/30/2014 1144   GLUCOSE 121* 09/03/2013 2145   GLUCOSE 124* 05/06/2012 1333   BUN 10.8 10/30/2014 1144   BUN 8 09/03/2013 2145   CREATININE 0.8 10/30/2014 1144   CREATININE 0.64 09/03/2013 2145   CALCIUM 10.2 10/30/2014 1144   CALCIUM 9.0 09/03/2013 2145   PROT 7.4 10/30/2014 1144   PROT 6.8 09/03/2013 2145   ALBUMIN 3.8 10/30/2014 1144   ALBUMIN 3.3* 09/03/2013 2145   AST 30 10/30/2014 1144   AST 55* 09/03/2013 2145   ALT 38 10/30/2014 1144   ALT 85* 09/03/2013 2145   ALKPHOS 92 10/30/2014 1144   ALKPHOS 114 09/03/2013 2145   BILITOT 0.40 10/30/2014 1144   BILITOT 0.3 09/03/2013 2145   GFRNONAA >90 09/03/2013 2145   GFRAA >90 09/03/2013 2145    No results found for: SPEP  Lab Results  Component Value Date   WBC 7.2 10/30/2014   NEUTROABS 5.9 10/30/2014   HGB 13.7 10/30/2014   HCT 40.4 10/30/2014   MCV 94.3 10/30/2014   PLT 392 10/30/2014      Chemistry       Component Value Date/Time   NA 140 10/30/2014 1144   NA 134* 09/03/2013 2145   K 4.4 10/30/2014 1144   K 3.7 09/03/2013 2145  CL 97 09/03/2013 2145   CL 105 05/06/2012 1333   CO2 27 10/30/2014 1144   CO2 23 09/03/2013 2145   BUN 10.8 10/30/2014 1144   BUN 8 09/03/2013 2145   CREATININE 0.8 10/30/2014 1144   CREATININE 0.64 09/03/2013 2145      Component Value Date/Time   CALCIUM 10.2 10/30/2014 1144   CALCIUM 9.0 09/03/2013 2145   ALKPHOS 92 10/30/2014 1144   ALKPHOS 114 09/03/2013 2145   AST 30 10/30/2014 1144   AST 55* 09/03/2013 2145   ALT 38 10/30/2014 1144   ALT 85* 09/03/2013 2145   BILITOT 0.40 10/30/2014 1144   BILITOT 0.3 09/03/2013 2145       Lab Results  Component Value Date   LABCA2 25 11/02/2007    No components found for: [id number]  No results for input(s): INR in the last 168 hours.  Urinalysis    Component Value Date/Time   COLORURINE YELLOW 09/03/2013 2109   APPEARANCEUR CLEAR 09/03/2013 2109   LABSPEC 1.005 09/12/2013 1542   LABSPEC 1.016 09/03/2013 2109   PHURINE 6.0 09/12/2013 1542   PHURINE 6.0 09/03/2013 2109   GLUCOSEU Negative 09/12/2013 [place] 09/03/2013 2109   HGBUR Negative 09/12/2013 1542   HGBUR NEGATIVE 09/03/2013 2109   BILIRUBINUR Negative 09/12/2013 [place] 09/03/2013 2109   KETONESUR Negative 09/12/2013 [place] 09/03/2013 2109   PROTEINUR Negative 09/12/2013 [employer] 09/03/2013 2109   UROBILINOGEN 0.2 09/12/2013 1542   UROBILINOGEN 0.2 09/03/2013 2109   NITRITE Negative 09/12/2013 1542   NITRITE NEGATIVE 09/03/2013 2109   LEUKOCYTESUR Negative 09/12/2013 1542   LEUKOCYTESUR NEGATIVE 09/03/2013 2109    STUDIES: Dg Chest 2 View  10/05/2014   CLINICAL DATA:  Serosanguineous blood from PleurX catheter on the left, history of breast carcinoma and chest wall pain  EXAM: CHEST  2 VIEW  COMPARISON:  CT chest of 09/04/2014 and chest x-ray of  06/19/2014  FINDINGS: Compared to the scout film from the CT chest recently, there may be more left pleural effusion present with volume loss at the left lung base. The PleurX catheter extends toward the lung apex. No pneumothorax is seen. The right lung is clear. There appears be some discontinuity of the anterior left seventh rib on the frontal view and a fracture of the left anterior seventh rib cannot be excluded. In review of the CT of 09/04/2014, the lateral left seventh rib appears somewhat lytic, and a metastatic lesion is a definite consideration. Heart size is stable.  IMPRESSION: 1. Perhaps slight increase in volume of left pleural effusion with left basilar atelectasis. 2. The PleurX catheter extends into the left lung apex. 3. Cannot exclude fracture of the left anterior seventh rib. Also in review of the recent CT, a lytic lesion involving the lateral left seventh rib may represent a metastatic lesion.   Electronically Signed   By: Ivar Drape M.D.   On: 10/05/2014 16:24   Ct Angio Chest Pe W/cm &/or Wo Cm  10/12/2014   CLINICAL DATA:  STAT CALL REPORT. PATIENT WAITING. QUESTION PE WITH ACUTE SHORT OF BREATH.  EXAM: CT ANGIOGRAPHY CHEST WITH CONTRAST  TECHNIQUE: Multidetector CT imaging of the chest was performed using the standard protocol during bolus administration of intravenous contrast. Multiplanar CT image reconstructions and MIPs were obtained to evaluate the vascular anatomy.  CONTRAST:  147m OMNIPAQUE IOHEXOL 350 MG/ML SOLN  COMPARISON:  CT 09/04/2014  FINDINGS: Mediastinum/Nodes: No filling  defects within the pulmonary suggest acute pulmonary embolism. No acute findings of the aorta great vessels. There is no pericardial fluid. Esophagus normal.  No axillary or supraclavicular lymphadenopathy. No mediastinal adenopathy. Esophagus normal.  Lungs/Pleura: There is a rind of pleural thickening within the right hemi thorax which is similar to comparison exam. This pleural thickening is  decreased in thickness along the upper left mediastinum measuring 17 mm compared to 29 mm on prior (image 36, series 4). At the medial left lung base nodular pleural thickening measures 2.4 cm compared to 2.6 cm. There is loculated pleural fluid within the left hemi thorax similar prior. There is rounded atelectasis of the right lower lobe which is also similar prior.  There are several small ground-glass nodules within the left upper lobe which are new from prior. For example 10 mm ground-glass nodule on image 28, series 7. Two nodules on image 28 and 34. There is a small bore catheter within the left hemi thorax with tip in the left anterior hemi thorax.  Upper abdomen: There is irregular nodules within the precordial space measuring 11 mm and 11 mm which are similar to 12 mm and 12 mm. There is nodularity within the upper abdomen along the crus of the diaphragm unchanged  Musculoskeletal: Sclerotic lesion associated with a left lateral rib stable.  Review of the MIP images confirms the above findings.  IMPRESSION: 1. No evidence acute pulmonary embolism. 2. Several new ground-glass nodules within the left upper lobe are concerning for early infection. 3. Extensive pleural nodularity within the right hemi thorax which appears mildly improved. 4. Stable loculated pleural fluid in the left hemi thorax with catheter in place. 5. Stable nodularity along the crus of the diaphragm in the upper abdomen as well as precordial nodules slightly improved. 6. Stable apparent left rib metastasis.   Electronically Signed   By: Suzy Bouchard M.D.   On: 10/12/2014 15:54   Mr Jeri Cos ZO Contrast  10/18/2014   CLINICAL DATA:  History of right upper inner quadrant breast cancer. Prior seizure in 06/2008. Evaluation for metastatic disease.  EXAM: MRI HEAD WITHOUT AND WITH CONTRAST  TECHNIQUE: Multiplanar, multiecho pulse sequences of the brain and surrounding structures were obtained without and with intravenous contrast.   CONTRAST:  58m MULTIHANCE GADOBENATE DIMEGLUMINE 529 MG/ML IV SOLN  COMPARISON:  [place] [facility] [place] imaging through the temporal lobes demonstrates normal volume and signal of the hippocampi. There is no evidence of acute infarct, intracranial hemorrhage, intra-axial mass, midline shift, or extra-axial fluid collection. There is mild generalized cerebral atrophy, unchanged. No significant cerebral white matter disease is identified. No enhancing parenchymal brain lesions are identified.  There is a new 2 cm enhancing lesion in the left parietal bone which extends through the inner table to the dura, which appears mildly thickened. Orbits are unremarkable. No significant inflammatory disease is seen in the paranasal sinuses or mastoid air cells. Major intracranial vascular flow voids are preserved.  IMPRESSION: 1. No evidence of brain metastases. 2. 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   Electronically Signed   By: ALogan Bores  On: 10/18/2014 16:12   Nm Pet Image Restag (ps) Skull Base To Thigh  10/30/2014   CLINICAL DATA:  Subsequent treatment strategy for breast cancer.  EXAM: NUCLEAR MEDICINE PET SKULL BASE TO THIGH  TECHNIQUE: 8.0 mCi F-18 FDG was injected intravenously. Full-ring  PET imaging was performed from the skull base to thigh after the radiotracer. CT data was obtained and used for attenuation correction and anatomic localization.  FASTING BLOOD GLUCOSE:  Value: 88 mg/dl  COMPARISON:  03/29/2014  FINDINGS: NECK  No hypermetabolic lymph nodes in the neck.  CHEST  Left axillary node measures 0.9 cm and has an SUV max equal to 11.05. New from previous exam. Resolution of previous hyper metabolism associated with the sub- carinal lymph node. There is a new hypermetabolic left infrahilar lymph node which has an SUV max equal to 6.8.  Left-sided chest tube is in place. The  loculated left pleural effusion is stable to decreased in volume from previous exam. Again noted is extensive hypermetabolic trans pleural spread of tumor involving the left hemi thorax. Anterior paramediastinal pleural tumor measures 5 x 1.6 cm and has an SUV max equal to 4.28. On the previous exam this measured 7.3 x 3.3 cm and had an SUV max equal to 14.28.Tumor invading the ventral chest wall appears decreased from previous exam. On today's study this measures approximately 1.3 cm in thickness and has an SUV max equal to 6.24. On the previous exam this measure 2.1 cm in thickness and had an SUV max equal to 14.28. Bulky pleural tumor at the posterior medial left base is again noted involving the T11 neuro foramina and extending into the last lateral recess of the canal it this level. On today's study this tumor measures approximately 5.9 cm and has an SUV max equal to 10.5. Previously this measured approximately 5.4 cm and had an SUV max equal to 12.9.  Patchy areas of consolidation and ground-glass attenuation are identified within the left lung and appear new from previous exam. These are nonspecific and may reflect changes of external beam radiation. Pulmonary parenchymal metastasis not excluded. Index nodule within the left upper lobe measures 2.1 cm and has an SUV max equal to 3.7. Within the superior segment of the right lower lobe there is a new parenchymal nodule measuring 11 mm within SUV max equal to 2.9.  ABDOMEN/PELVIS  No abnormal hypermetabolic activity within the liver, pancreas, adrenal glands, or spleen. 1.6 cm periaortic lymph node is identified within SUV max equal to 11.6. Previously this lymph node measured 1.4 cm and had an SUV max equal 7.5. Adjacent lymph node measures 1.1 cm and has an SUV max of 8.4. Previously this mesh 0.9 cm and had an SUV max equal to 9.28. More caudal cluster of periaortic lymph nodes measures 2.5 cm and has an SUV max equal to 6.3 cm. Previously 1.9 cm within SUV  max equal to 6.3. No enlarged or hypermetabolic pelvic or inguinal lymph nodes.  SKELETON  No focal hypermetabolic activity to suggest skeletal metastasis.  IMPRESSION: 1. Interval mixed response to therapy. Specifically, the bulky pleural based tumor along the paramediastinal left upper lobe extending into the ventral chest wall has decreased in size and degree of FDG uptake. Additionally, there is been resolution of previous hyper metabolism associated with the sub- carinal adenopathy. Persistent pleural based tumor overlying the posterior and medial left lung base is again noted. There is continued extension into the T11 neuro foramina and lateral recess of the canal. 2. New hypermetabolic left axillary and left hilar adenopathy. Additionally, persistent hypermetabolic adenopathy within the upper abdomen demonstrates mild increase in size in the interval. 3. Bilateral pulmonary nodular opacities are identified and are nonspecific. These exhibit malignant range FDG uptake and are new from previous exam. Findings  may reflect nonspecific pneumonitis and may also be seen with changes of external beam radiation. Pulmonary metastasis not excluded.   Electronically Signed   By: Kerby Moors M.D.   On: 10/30/2014 16:12    ASSESSMENT: 54 y.o. [place] woman with stage IV breast cancer, history as follows  (1)  S/p Right lumpectomy and sentinel lymph node sampling 03/15/2004 for a pT1c pN0. Stage IA invasive ductal carcinoma, grade 2, estrogen receptor 95% positive, progesterone receptor 65% positive, HER-2 not amplified; additional surgery 04/25/2004 for seroma or clearance showed no residual tumor  (2) adjuvant chemotherapy with cyclophosphamide and doxorubicin every 21 days x4 completed 07/19/2004  (3) adjuvant radiation given under Dr. Donella Stade in [place] completed July 2006  (4) the patient opted against adjuvant antiestrogen therapy  (5) genetics testing showed no BRCA mutations  (6) biopsy of a  palpable right axillary mass 10/24/2009 showed invasive ductal carcinoma, grade 3, estrogen receptor 100% positive, progesterone receptor 2% positive (alert score 5) HER-2 negative; no evidence of systemic disease on PET scanning  (7) completed 3 of 4 planned cycles of docetaxel and cyclophosphamide September 2011, fourth cycle omitted because of marked elevations in liver function tests  (8) an right axillary lymph node dissection 03/06/2010 showed 3/8 lymph nodes removed to be involved by tumor, with extracapsular extension.  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, TTS-1 negative), estrogen receptor 80% positive, progesterone receptor 2% positive, HER-2 not amplified  (12) letrozole started March 2015-- tolerated with significant side effects, discontinued at the end of May 2015  (13) PET scan 08/16/2013 shows extensive left pleural metastatic disease and a large left pleural effusion that shifts cardiac and mediastinal structures to the right; adenopathy (celiac trunk, periadrenal, periaortic); and a left medial clavicular lesion; Status post left thoracentesis 08/16/2013 positive for adenocarcinoma, estrogen receptor positive, progesterone receptor negative.  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; everolimus was suggested as an available targeted agent  (18) exemestane started 03/31/2014, discontinued 10/31/2014 with evidence of progression  (a) everolimus added 04/03/2014 but not tolerated (cytopenias, elevated LFTs) even at minimal doses; stopped 04/17/2014  (19) fulvestrant to start 11/14/2014    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 treated with Lovenox for 5-6 months  (c) right upper extremity lymphedema  (d) hepatic steatosis with chronically elevated LFTs as well as unusual hepatic sensitivity to chemotherapy  (e) osteopenia with the lowest T score -1.6 on bone density scan 06/20/2013  (f) radiation oncology (Dr Valere Dross) has reviewed prior radiation records in case there is further mediastinal involvement with dysphagia etc in which case palliative XRT could be considered  (a) radiation to left mediastinum/ left 7th rib 3250 cGy in 13 sessions04/18/2016 through 08/02/2014  PLAN: Mckenleigh's situation is complex and we spent well over an hour going over several issues.. On the one hand she had a very nice reaction to the radiation she received from Dr. Stanton Kidney and I believe that achieved its purpose which was to keep the left lung open if possible and to prevent further involvement of the mediastinum. On the other hand we are seeing more left axillary adenopathy, and it may be that the radiation change the drainage pattern and facilitated spread to the left axilla. That in itself of course would not be a major development. 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 She also has involvement of T11 foramen. Both those are currently asymptomatic but could become a source of trouble in the future. It probably would be prudent to irradiate them prophylactically. I am referring Jannette back to Dr. Valere Dross with that in mind.  I do think Esra is having very slow progression of her disease. That is my best assessment at this time. Accordingly we are stopping the exemestane. She will be off it for about 2 weeks before starting fulvestrant. That will give [place] a little time to assess whether the diarrhea, cramps, and other symptoms that she thinks are due to the exemestane, actually resolved when she goes off that medication or whether they continue.  We discussed the possible toxicities, side effects and complications of fulvestrant which generally it is well-tolerated. She would like to started August 16 since I will be out of town until the day before that. She will receive a loading dose on week 2 and then the usual dose on week for an every 28 days thereafter. I would think around December which should be able to repeat a PET scan and assess for response.  She is interested in participating in a cell mediated immunotherapy study at [facility]. This would only be possible if her tumor expresses mesothelial and. I do not know if it will be possible to check for this but I will inquire from pathology.  She has been draining less from her left Pleurx and I don't see a significant residual effusion on the PET scan obtained yesterday. I suggested she hold off on drainage for 1 or even 2 weeks and see if the fluid reaccumulated's. If it does she can simply call the nurse and have it drained. Otherwise we would obtain a repeat chest x-ray just to assess that in 2 weeks or so.  [place] has a good  understanding of this plan. She agrees with it. She knows the goal of treatment in her case is control. She will call with any problems that may develop before her next visit here.      Chauncey Cruel, MD   10/31/2014 6:14 PM

## 2014-11-01 ENCOUNTER — Other Ambulatory Visit: Payer: Self-pay | Admitting: Oncology

## 2014-11-03 ENCOUNTER — Ambulatory Visit: Payer: 59 | Admitting: Physical Therapy

## 2014-11-03 DIAGNOSIS — I890 Lymphedema, not elsewhere classified: Secondary | ICD-10-CM

## 2014-11-03 DIAGNOSIS — M549 Dorsalgia, unspecified: Secondary | ICD-10-CM

## 2014-11-03 DIAGNOSIS — R0789 Other chest pain: Secondary | ICD-10-CM

## 2014-11-03 DIAGNOSIS — M6289 Other specified disorders of muscle: Secondary | ICD-10-CM

## 2014-11-03 NOTE — Therapy (Signed)
[place], [place], [address] Phone: [phone]   Fax:  [phone]  Physical Therapy Treatment  Patient Details  Name: [name] [name] MRN: [id number] Date of Birth: [birth year] Referring Provider:  Chauncey Cruel, MD  Encounter Date: 11/03/2014      PT End of Session - 11/03/14 1215    Visit Number 61   Number of Visits 52   Date for PT Re-Evaluation 12/01/14   PT Start Time 1020   PT Stop Time 1107   PT Time Calculation (min) 47 min   Activity Tolerance Patient tolerated treatment well   Behavior During Therapy [facility] for tasks assessed/performed      Past Medical History  Diagnosis Date  . Seizures 2010    Isolated incident.  Marland Kitchen PONV (postoperative nausea and vomiting)   . Peripheral vascular disease 02/2010    blood clot related to porta cath  . Breast cancer dx'd 2005/2011  . Bone metastases dx'd 05/2014  . S/P radiation therapy 07/17/2014 through 08/02/2014     Left mediastinum, left seventh rib 3250 cGy in 13 sessions     Past Surgical History  Procedure Laterality Date  . Breast lumpectomy  2005  . Axillary lymph node dissection  Dec. 2011  . Portacath placement  12/11  . Removal portacath    . Mediastinotomy chamberlain mcneil Left 06/02/2013    Procedure: MEDIASTINOTOMY CHAMBERLAIN MCNEIL;  Surgeon: [name], MD;  Location: [place];  Service: Thoracic;  Laterality: Left;  LEFT ANTERIOR MEDIASTINOTOMY     There were no vitals filed for this visit.  Visit Diagnosis:  Lymphedema  Muscle stiffness  Other chest pain  Acute back pain      Subjective Assessment - 11/03/14 1108    Subjective The PET scan showed lesions at foramen of T11.  Might have radiation to that area and to the area on the skull with a lesion. Also has new suspicious spots in both lungs.  PleurX tube drainage was  lower Monday and very low yesterday, yet the nurse felt there was not more fluid in there.  Will start Fulvestrant injections in mid-August to try to control newer cancer spread.   Currently in Pain? Yes   Pain Score 7   4 at right axilla   Pain Location Flank   Pain Orientation Left   Pain Descriptors / Indicators Other (Comment)  kept her up at night   Pain Type Acute pain   Aggravating Factors  certain positions   Pain Relieving Factors advil, having back flat against chair or mat   Effect of Pain on Daily Activities has had difficulty sleeping a couple of nights                         [facility] Adult PT Treatment/Exercise - 11/03/14 0001    Manual Therapy   Myofascial Release In supine, soft tissue mobilization and release at right axilla.   Manual Lymphatic Drainage (MLD) In left sidelying, posterior interaxillary anastomosis and right axillo-inguinal anastomosis; in supine, short neck, left axilla and anterior interaxillary anastomosis, right groin and axillo-inguinal anastomosis, area between right breast scars, directing toward pathways, and right upper arm.  In right sidelying, left periscapular area toward left groin.   Manual Therapy Other (comment)  In Rt. sidelying, soft tissue work to left back, neck  [facility] Goals - 11/03/14 1224    CC Long Term Goal  #1   Baseline Goal is not currently being met; patient has just learned of new metastases to T11 vertebrae, probably explaining this.   Status On-going   CC Long Term Goal  #2   Status On-going            Plan - 11/03/14 1215    Clinical Impression Statement Patient with worsening left lower back pain, apparently from metastases at T11 spine, but helped with therapy, which reduced pain from 7/10 to about 4.5/10.   Pt will benefit from skilled therapeutic intervention in order to improve on the following deficits Increased edema;Pain;Increased fascial  restricitons   Rehab Potential Good   Clinical Impairments Affecting Rehab Potential active cancer   PT Frequency 2x / week   PT Duration 12 weeks   PT Treatment/Interventions Manual lymph drainage;Manual techniques   PT Next Visit Plan Manual lymph drainage, myofascial release, soft tissue work.   Consulted and Agree with Plan of Care Patient        Problem List Patient Active Problem List   Diagnosis Date Noted  . 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 Chest wall pain 03/21/2014  . Abnormal LFTs (liver function tests) 09/12/2013  . Pleural effusion 08/18/2013  . 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 Secondary malignant neoplasm of mediastinal lymph node 08/18/2013    [name],[name] 11/03/2014, 12:26 PM  [place], [place], [address] Phone: [phone]   Fax:  [place], PT 11/03/2014 12:26 PM

## 2014-11-06 ENCOUNTER — Telehealth: Payer: Self-pay | Admitting: *Deleted

## 2014-11-06 ENCOUNTER — Ambulatory Visit: Payer: 59 | Admitting: Physical Therapy

## 2014-11-06 DIAGNOSIS — I890 Lymphedema, not elsewhere classified: Secondary | ICD-10-CM | POA: Diagnosis not present

## 2014-11-06 DIAGNOSIS — M6289 Other specified disorders of muscle: Secondary | ICD-10-CM

## 2014-11-06 DIAGNOSIS — R0789 Other chest pain: Secondary | ICD-10-CM

## 2014-11-06 DIAGNOSIS — M549 Dorsalgia, unspecified: Secondary | ICD-10-CM

## 2014-11-06 NOTE — Telephone Encounter (Signed)
[name] [name] called stating that she wants an x-ray of her chest on either Thursday morning at ~11:00am which was discussed with Dr. Jana Hakim.  She is concerned that she has fluid "build up" since she has decrease in her PleuRx volume which she relays is  is "next to "nothing" and without any drainage today. No increased SOB at this time. She was assessed by home health, RN and was assured that she does not have any changes during auscultation of her lungs  Despite th low fluid volume.  She  reports that she felt left sided chest  Pressure for ~5-10 mins this morning, but none since. She also reports increased in the size of the lymph nodes in her left axilla and again expressed concerne that she has "fluid build-up" due to thickening of her waist line and questionably in her ankles. Continues with lymphedema clinic appointments.  She reports pain in the T11 region as being severe last week, but now diminshed to the point that she was able to walk more comfortably this Saturday and Sunday.

## 2014-11-06 NOTE — Therapy (Signed)
[place], [place], [address] Phone: [phone]   Fax:  [phone]  Physical Therapy Treatment  Patient Details  Name: [name] [name] MRN: [id number] Date of Birth: Jun 25, 1960 Referring Provider:  Chauncey Cruel, MD  Encounter Date: 11/06/2014      PT End of Session - 11/06/14 1716    Visit Number 70   Number of Visits 65   Date for PT Re-Evaluation 12/01/14   PT Start Time 1021   PT Stop Time 1103   PT Time Calculation (min) 42 min   Activity Tolerance Patient tolerated treatment well   Behavior During Therapy [facility] for tasks assessed/performed      Past Medical History  Diagnosis Date  . Seizures 2010    Isolated incident.  Marland Kitchen PONV (postoperative nausea and vomiting)   . Peripheral vascular disease 02/2010    blood clot related to porta cath  . Breast cancer dx'd 2005/2011  . Bone metastases dx'd 05/2014  . S/P radiation therapy 07/17/2014 through 08/02/2014     Left mediastinum, left seventh rib 3250 cGy in 13 sessions     Past Surgical History  Procedure Laterality Date  . Breast lumpectomy  2005  . Axillary lymph node dissection  Dec. 2011  . Portacath placement  12/11  . Removal portacath    . Mediastinotomy chamberlain mcneil Left 06/02/2013    Procedure: MEDIASTINOTOMY CHAMBERLAIN MCNEIL;  Surgeon: [name], MD;  Location: [place];  Service: Thoracic;  Laterality: Left;  LEFT ANTERIOR MEDIASTINOTOMY     There were no vitals filed for this visit.  Visit Diagnosis:  Lymphedema  Muscle stiffness  Other chest pain  Acute back pain      Subjective Assessment - 11/06/14 1022    Subjective Was able to get out of pain yesterday.  Now having discomfort around left side at approx. T11.  Had pressure and difficulty breathing briefly this morning, plus pressure at left head.   Currently  in Pain? Yes   Pain Score 4    Pain Location Flank   Pain Orientation Left   Pain Descriptors / Indicators Tightness   Aggravating Factors  ?after PleurX tube drainage?   Pain Relieving Factors Motrin            [facility] PT Assessment - 11/06/14 0001    6 Minute Walk- Baseline   6 Minute Walk- Baseline no                     [facility] Adult PT Treatment/Exercise - 11/06/14 0001    Manual Therapy   Myofascial Release In supine, soft tissue mobilization and release at right axilla.   Manual Lymphatic Drainage (MLD) In left sidelying, posterior interaxillary anastomosis and right axillo-inguinal anastomosis; in supine, short neck, left axilla and anterior interaxillary anastomosis, right groin and axillo-inguinal anastomosis, area between right breast scars, directing toward pathways, and right upper arm.  In right sidelying, left periscapular area toward left groin.   Manual Therapy Other (comment)  In Rt. sidelying, soft tissue work to left back, neck                         [facility] Goals - 11/03/14 1224    CC Long Term Goal  #1   Baseline Goal is not currently being met; patient has just learned of new metastases to T11 vertebrae, probably explaining this.   Status On-going  CC Long Term Goal  #2   Status On-going            Plan - 11/06/14 1717    Clinical Impression Statement Left low back pain a little better today, though still there.  Patient's tissue feels like she may be retaining fluid; she reports feeling thicker around the middle.  Pt. expresses doubt about what course of treatment to pursue next and talked about this today.   Pt will benefit from skilled therapeutic intervention in order to improve on the following deficits Increased edema;Pain;Increased fascial restricitons   Rehab Potential Good   Clinical Impairments Affecting Rehab Potential active cancer   PT Frequency 2x / week   PT Duration 12 weeks   PT  Treatment/Interventions Manual lymph drainage;Manual techniques   PT Next Visit Plan Manual lymph drainage, myofascial release, soft tissue work.   Consulted and Agree with Plan of Care Patient        Problem List Patient Active Problem List   Diagnosis Date Noted  . 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 Chest wall pain 03/21/2014  . Abnormal LFTs (liver function tests) 09/12/2013  . Pleural effusion 08/18/2013  . 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 Secondary malignant neoplasm of mediastinal lymph node 08/18/2013    Nickalas Mccarrick 11/06/2014, 5:19 PM  [place], [place], [address] Phone: [phone]   Fax:  [place], PT 11/06/2014 5:19 PM

## 2014-11-07 ENCOUNTER — Other Ambulatory Visit: Payer: Self-pay | Admitting: *Deleted

## 2014-11-07 DIAGNOSIS — C50211 Malignant neoplasm of upper-inner quadrant of right female breast: Secondary | ICD-10-CM

## 2014-11-07 DIAGNOSIS — J90 Pleural effusion, not elsewhere classified: Secondary | ICD-10-CM

## 2014-11-08 ENCOUNTER — Ambulatory Visit (HOSPITAL_BASED_OUTPATIENT_CLINIC_OR_DEPARTMENT_OTHER): Payer: 59 | Admitting: Hematology and Oncology

## 2014-11-08 ENCOUNTER — Encounter: Payer: Self-pay | Admitting: Hematology and Oncology

## 2014-11-08 ENCOUNTER — Other Ambulatory Visit: Payer: Self-pay | Admitting: Hematology and Oncology

## 2014-11-08 ENCOUNTER — Ambulatory Visit (HOSPITAL_COMMUNITY)
Admission: RE | Admit: 2014-11-08 | Discharge: 2014-11-08 | Disposition: A | Discharge status: Home / Self Care | Payer: 59 | Source: Ambulatory Visit | Attending: Oncology | Admitting: Oncology

## 2014-11-08 ENCOUNTER — Telehealth: Payer: Self-pay | Admitting: Oncology

## 2014-11-08 ENCOUNTER — Other Ambulatory Visit: Payer: Self-pay | Admitting: *Deleted

## 2014-11-08 DIAGNOSIS — Z79811 Long term (current) use of aromatase inhibitors: Secondary | ICD-10-CM | POA: Diagnosis not present

## 2014-11-08 DIAGNOSIS — J90 Pleural effusion, not elsewhere classified: Secondary | ICD-10-CM

## 2014-11-08 DIAGNOSIS — C50511 Malignant neoplasm of lower-outer quadrant of right female breast: Secondary | ICD-10-CM | POA: Diagnosis not present

## 2014-11-08 DIAGNOSIS — R0789 Other chest pain: Secondary | ICD-10-CM

## 2014-11-08 DIAGNOSIS — C50211 Malignant neoplasm of upper-inner quadrant of right female breast: Secondary | ICD-10-CM

## 2014-11-08 DIAGNOSIS — J984 Other disorders of lung: Secondary | ICD-10-CM | POA: Diagnosis not present

## 2014-11-08 NOTE — Telephone Encounter (Signed)
Gave avs & calendar for August °

## 2014-11-08 NOTE — Progress Notes (Signed)
Patient Care Team: Pcp Not In System as PCP - General  DIAGNOSIS: Breast cancer of upper-inner quadrant of right female breast   Staging form: Breast, AJCC 7th Edition     Clinical: Stage IA (T1c, N0, cM0) - Signed by Chauncey Cruel, MD on 08/18/2013 Secondary malignant neoplasm of mediastinal lymph node   Staging form: Breast, AJCC 7th Edition     Clinical: Stage IV (T1, N2, M1) - Signed by Deatra Robinson, MD on 06/06/2013     Pathologic: No stage assigned - Unsigned   SUMMARY OF ONCOLOGIC HISTORY:   Breast cancer (Resolved)   01/26/2004 Initial Diagnosis Breast cancer   03/15/2004 Pathology Results Invasive ductal carcinoma. 1.7 cm, grade 2 of 3.  Margins free of tumor.  Atypical lobular hyperplasis noted. One sentinel node removed & negative for metastatic disease.  Estrogen receptor positive, progesterone receptor positive.  Her-2/neu 2+  FISH neg   03/15/2004 Cancer Staging T1c, N0, MX. Stage IA invasive ductal carcinoma.  Grade 2.  Estrogen receptor 95% positive, progesterone receptor 65% positive, HER-2 not amplified.    BRCA negative    - 07/19/2004 Chemotherapy cyclophosphamide and doxorubicin 21 days x 4 cycles.    - 10/28/2004 Radiation Therapy Adjuvant - Dr. Donella Stade [facility])    Pathology Results     Pleural effusion   06/02/2013 Pathology Results Lymph Node biopsy. CISH - HER-2/neu - no amplification.  Ratio HER2/CEP 17 = 1.47.  Avg HER2 copy number per cell = 3.45.  ER pos 80% , PR pos 2%, GCDFP pos, TTF-1 neg, napsin-A neg   06/02/2013 Initial Diagnosis Metastiv carcinoma consistnet with breast primary.  Mediastinal adenopathy - invasive ductal carcinoma.     06/05/2013 - 08/17/2013 Chemotherapy Letrozole - discontinued d/t side effects   08/16/2013 Pathology Results PLeural Fluid - Positive MOC-31, CK7, ER, PR negative, CK20, WT-1, TTF-1, calretinin   08/16/2013 Procedure Thoracentesis 1.5 L clear yellow    08/16/2013 Imaging PET extensive multifocal left pleural metastatic  disease, Hypermetabolic prevascular and upper abdominal retroperitoneal adenopathy.  Shift cardiac/mediastinal structures to rt, metastatic lesion in left medial clavicle, hepatic steatosis    08/24/2013 Procedure Thoracentesis 1 L serosanguineous removed    Pathology Results Invasive ductal carcinoma (gross cystic disease fluid protein positive, TTS-1 negative), estrogen receptor 80% positive, progesterone receptor 2% positive, HER-2 not amplified.     09/01/2013 -  Chemotherapy Eribulin 21 day cycle day 1 and 8   09/01/2013 Procedure Left pleural drain place. 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   [facility] - BRCA negative   10/24/2009 Cancer Staging Invasive dyctal carcinoma, grade 3 estrogen receptor 100% positive, progesterone receptor 2% positive (alert score 5) HER-2 negative    - 06/29/2010 Radiation Therapy 45 Gy - rt axillary and rt supraclavicular nodal areas with capecitabine sensitization   09/2009 Imaging PET scan - no evidence of systemic disease.  Rt axillary lymph node dissection 3/8 nodes involved by tumor with extracapsular extension.    11/01/2009 Pathology Results Needle core biopsy right breast - Invasive adenocarcinoma, ductal, grade 2 of 3   11/01/2009 Pathology Results Needle core biopsy rt axilla - invasive adenocarcinoma of breast, ductal, grade 3 of 3.  ER 100% 3+, PR 2% 3+, HER2/NEU 1+ (Negative)    - 12/28/2009 Chemotherapy 3 of 4 cycles docetaxel and cyclophosphamide.  4th omitted d/t elevated LFTs.  Intolerant letrozole and  exemestane.     03/06/2010 Pathology Results Rt axillary lymph node: 3/8 positive metastatic adenocarcinoma with extensive extracapsular extension   03/06/2010 Cancer Staging Lymph nodes - pTX, pN1a, pMX   11/2010 - 05/2011 Chemotherapy Tamoxifen with interruptions   05/2013 -  Chemotherapy Tamoxifen continuous    CHIEF COMPLIANT:  Pain in the left chest  INTERVAL HISTORY: [name] [name] is a 54 year old with above-mentioned history of metastatic breast cancer who came in for an urgent visit because she had experienced sharp pain in the left chest and had an x-ray this morning and is here today to discuss the results of x-ray. She reported that she stopped draining her Pleurx catheter and was doing quite well up until recently when she started having increasing chest discomfort and last night she had a sudden episode of intractable pain. She took ibuprofen which relieved her symptoms.  REVIEW OF SYSTEMS:   Constitutional: Denies fevers, chills or abnormal weight loss Eyes: Denies blurriness of vision Ears, nose, mouth, throat, and face: Denies mucositis or sore throat Respiratory: Left chest wall pain and some fullness in the chest wall dorsum back Cardiovascular: Denies palpitation, chest discomfort or lower extremity swelling Gastrointestinal:  Denies nausea, heartburn or change in bowel habits Skin: Denies abnormal skin rashes Lymphatics: Denies new lymphadenopathy or easy bruising Neurological:Denies numbness, tingling or new weaknesses Behavioral/Psych: Mood is stable, no new changes  All other systems were reviewed with the patient and are negative.  I have reviewed the past medical history, past surgical history, social history and family history with the patient and they are unchanged from previous note.  ALLERGIES:  is allergic to decadron; enoxaparin; fluconazole; hydromorphone hcl; morphine and related; and tegaderm ag mesh.  MEDICATIONS:  Current Outpatient Prescriptions  Medication Sig Dispense Refill  . ALPRAZolam (XANAX) 0.5 MG tablet Take 1 tablet (0.5 mg total) by mouth 2 (two) times daily as needed for anxiety. 30 tablet 0  . b complex vitamins capsule Take 1 capsule by mouth daily.    . Calcium-Magnesium-Vitamin D (CITRACAL CALCIUM+D PO) Take by mouth. 438m vitamin C, 5018mvitamin D3 2 PO  DAILY    . cholecalciferol (VITAMIN D) 1000 UNITS tablet Take 1,000 Units by mouth daily. Takes 2,000 Iu daily    . exemestane (AROMASIN) 25 MG tablet Take 25 mg by mouth daily after breakfast.   4  . folic acid (FOLVITE) 1 MG tablet Take 1 mg by mouth daily.    . Marland Kitchenbuprofen (ADVIL,MOTRIN) 200 MG tablet Take 200 mg by mouth every 4 (four) hours as needed.    . Melatonin 1 MG TABS Take 3 mg by mouth at bedtime as needed (sleep).     . Marland KitchenxyCODONE (OXY IR/ROXICODONE) 5 MG immediate release tablet Take 1 tablet (5 mg total) by mouth every 4 (four) hours as needed for severe pain. 30 tablet 0  . [DISCONTINUED] metoCLOPramide (REGLAN) 10 MG tablet Take 1 tablet (10 mg total) by mouth 4 (four) times daily -  before meals and at bedtime. 60 tablet 3   No current facility-administered medications for this visit.    PHYSICAL EXAMINATION: ECOG PERFORMANCE STATUS: 1 - Symptomatic but completely ambulatory  GENERAL:alert, no distress and comfortable SKIN: skin color, texture, turgor are normal, no rashes or significant lesions EYES: normal, Conjunctiva are pink and non-injected, sclera clear OROPHARYNX:no exudate, no erythema and lips, buccal mucosa, and tongue normal  NECK: supple, thyroid normal size, non-tender, without nodularity LYMPH:  no palpable lymphadenopathy in the cervical, axillary or  inguinal LUNGS: Diminished breath sounds at the left lung base HEART: regular rate & rhythm and no murmurs and no lower extremity edema ABDOMEN:abdomen soft, non-tender and normal bowel sounds Musculoskeletal:no cyanosis of digits and no clubbing  NEURO: alert & oriented x 3 with fluent speech, no focal motor/sensory deficits  LABORATORY DATA:  I have reviewed the data as listed   Chemistry      Component Value Date/Time   NA 140 10/30/2014 1144   NA 134* 09/03/2013 2145   K 4.4 10/30/2014 1144   K 3.7 09/03/2013 2145   CL 97 09/03/2013 2145   CL 105 05/06/2012 1333   CO2 27 10/30/2014 1144   CO2 23  09/03/2013 2145   BUN 10.8 10/30/2014 1144   BUN 8 09/03/2013 2145   CREATININE 0.8 10/30/2014 1144   CREATININE 0.64 09/03/2013 2145      Component Value Date/Time   CALCIUM 10.2 10/30/2014 1144   CALCIUM 9.0 09/03/2013 2145   ALKPHOS 92 10/30/2014 1144   ALKPHOS 114 09/03/2013 2145   AST 30 10/30/2014 1144   AST 55* 09/03/2013 2145   ALT 38 10/30/2014 1144   ALT 85* 09/03/2013 2145   BILITOT 0.40 10/30/2014 1144   BILITOT 0.3 09/03/2013 2145       Lab Results  Component Value Date   WBC 7.2 10/30/2014   HGB 13.7 10/30/2014   HCT 40.4 10/30/2014   MCV 94.3 10/30/2014   PLT 392 10/30/2014   NEUTROABS 5.9 10/30/2014     RADIOGRAPHIC STUDIES: I have personally reviewed the radiology reports and agreed with their findings. Dg Chest 2 View  11/08/2014   CLINICAL DATA:  History of breast cancer and left pleural catheter. Evaluate for a left pleural effusion. Decreased fluid drainage but fullness and pain in the left lower chest.  EXAM: CHEST  2 VIEW  COMPARISON:  10/05/2014 and 10/30/2014  FINDINGS: Stable position of the left pleural catheter. There are densities in the left upper lobe that correspond with the nodular disease seen on the PET-CT from 10/30/2014. There continues to be a pleural collection at the left lung base which has minimally changed in size. Right lung remains clear. Surgical clips in the right axilla. Heart and mediastinum are stable. Negative for a pneumothorax.  IMPRESSION: Persistent parenchymal densities in the left upper lobe which were present on the recent PET-CT. These densities are nonspecific and may be better characterized with a follow-up chest CT.  Stable position of the left chest tube. There is persistent loculated fluid at the left lung base which has minimally changed.   Electronically Signed   By: Markus Daft M.D.   On: 11/08/2014 09:47     ASSESSMENT & PLAN:  Left chest wall pain: I reviewed the chest x-ray with the patient and compared it to  her previous x-rays. There is not any increased evidence of pleural fluid. The Pleurx catheter was also in place. So I suspect that the pain may be pleuritic in nature. I encouraged her to use ibuprofen on an as-needed basis.  Dr. Jana Hakim had planned to remove the Pleurx catheter next week. She would like to get another chest x-ray next Monday and see him in office on Tuesday.  Metastatic breast cancer: On hormonal therapy  No orders of the defined types were placed in this encounter.   The patient has a good understanding of the overall plan. she agrees with it. she will call with any problems that may develop before the next visit  here.   Rulon Eisenmenger, MD

## 2014-11-10 ENCOUNTER — Ambulatory Visit: Payer: 59 | Admitting: Hematology and Oncology

## 2014-11-10 ENCOUNTER — Ambulatory Visit: Payer: 59 | Admitting: Physical Therapy

## 2014-11-10 DIAGNOSIS — M549 Dorsalgia, unspecified: Secondary | ICD-10-CM

## 2014-11-10 DIAGNOSIS — I890 Lymphedema, not elsewhere classified: Secondary | ICD-10-CM

## 2014-11-10 DIAGNOSIS — R0789 Other chest pain: Secondary | ICD-10-CM

## 2014-11-10 DIAGNOSIS — M6289 Other specified disorders of muscle: Secondary | ICD-10-CM

## 2014-11-10 NOTE — Therapy (Signed)
[place], [place], [address] Phone: [phone]   Fax:  [phone]  Physical Therapy Treatment  Patient Details  Name: [name] [name] MRN: [id number] Date of Birth: January 16, 1961 Referring Provider:  Chauncey Cruel, MD  Encounter Date: 11/10/2014      PT End of Session - 11/10/14 1253    Visit Number 63   Number of Visits 38   Date for PT Re-Evaluation 12/01/14   PT Start Time 3734   PT Stop Time 1106   PT Time Calculation (min) 43 min   Activity Tolerance Patient tolerated treatment well  some discomfort with lying supine a while   Behavior During Therapy [facility] for tasks assessed/performed      Past Medical History  Diagnosis Date  . Seizures 2010    Isolated incident.  Marland Kitchen PONV (postoperative nausea and vomiting)   . Peripheral vascular disease 02/2010    blood clot related to porta cath  . Breast cancer dx'd 2005/2011  . Bone metastases dx'd 05/2014  . S/P radiation therapy 07/17/2014 through 08/02/2014     Left mediastinum, left seventh rib 3250 cGy in 13 sessions     Past Surgical History  Procedure Laterality Date  . Breast lumpectomy  2005  . Axillary lymph node dissection  Dec. 2011  . Portacath placement  12/11  . Removal portacath    . Mediastinotomy chamberlain mcneil Left 06/02/2013    Procedure: MEDIASTINOTOMY CHAMBERLAIN MCNEIL;  Surgeon: [name], MD;  Location: [facility];  Service: Thoracic;  Laterality: Left;  LEFT ANTERIOR MEDIASTINOTOMY     There were no vitals filed for this visit.  Visit Diagnosis:  Lymphedema  Muscle stiffness  Other chest pain  Acute back pain      Subjective Assessment - 11/10/14 1025    Subjective Want to start opening up the left groin and draining the abdomen too.  Have had four episodes of the sharp pains in left axilla.  Feel generally like  I'm holding fluid elsewhere.   Currently in Pain? Yes   Pain Score 4   4-5 at right axilla   Pain Location Flank   Pain Orientation Left   Aggravating Factors  increased fluid   Pain Relieving Factors Motrin                         [facility] Adult PT Treatment/Exercise - 11/10/14 0001    Manual Therapy   Manual Lymphatic Drainage (MLD) In left sidelying, posterior interaxillary anastomosis right to left and right axillo-inguinal anastomosis; in supine, short neck; superficial and deep abdominals at lower abdomen only; right groin and right axillo-inguinal anastomosis, and right lateral breast scar area, directing towards right axillo-inguinal anastomosis; left groin.  In right sidelying, left axillo-inguinal anastomosis and posterior to that, directing toward left groin.   Manual Therapy Other (comment)  In Rt. sidelying, soft tissue work to left back, neck                         Long Term Clinic Goals - 11/10/14 1256    CC Long Term Goal  #1   Title Reports left upper back and left side pain is controlled at 4/10 or less with therapy at the current frequency.   Status On-going   CC Long Term Goal  #2   Title Pt. will report swelling is adequately managed to enable ADL function at a consistent  level.   Status On-going            Plan - 11/10/14 1254    Clinical Impression Statement Patient feeling more swollen in general at her trunk and requests lymphatic drainage at both inguinal areas.  Having more pain on left side as well as right axilla.  Did report decrease in discomfort at end of session.   Pt will benefit from skilled therapeutic intervention in order to improve on the following deficits Increased edema;Pain;Increased fascial restricitons   Rehab Potential Good   Clinical Impairments Affecting Rehab Potential active cancer   PT Frequency 2x / week   PT Duration 12 weeks   PT Treatment/Interventions Manual lymph drainage;Manual techniques    PT Next Visit Plan manual lymph drainage, soft tissue work   Consulted and Agree with Plan of Care Patient        Problem List Patient Active Problem List   Diagnosis Date Noted  . 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 Chest wall pain 03/21/2014  . Abnormal LFTs (liver function tests) 09/12/2013  . Pleural effusion 08/18/2013  . 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

## 2014-11-11 ENCOUNTER — Encounter (HOSPITAL_COMMUNITY): Payer: Self-pay | Admitting: Emergency Medicine

## 2014-11-11 ENCOUNTER — Emergency Department (HOSPITAL_COMMUNITY)
Admission: EM | Admit: 2014-11-11 | Discharge: 2014-11-11 | Disposition: A | Discharge status: Home / Self Care | Payer: 59 | Attending: Emergency Medicine | Admitting: Emergency Medicine

## 2014-11-11 DIAGNOSIS — Z8679 Personal history of other diseases of the circulatory system: Secondary | ICD-10-CM | POA: Diagnosis not present

## 2014-11-11 DIAGNOSIS — M546 Pain in thoracic spine: Secondary | ICD-10-CM | POA: Diagnosis not present

## 2014-11-11 DIAGNOSIS — Z79899 Other long term (current) drug therapy: Secondary | ICD-10-CM | POA: Diagnosis not present

## 2014-11-11 DIAGNOSIS — M545 Low back pain: Secondary | ICD-10-CM | POA: Diagnosis present

## 2014-11-11 DIAGNOSIS — G893 Neoplasm related pain (acute) (chronic): Secondary | ICD-10-CM | POA: Insufficient documentation

## 2014-11-11 DIAGNOSIS — Z85830 Personal history of malignant neoplasm of bone: Secondary | ICD-10-CM | POA: Insufficient documentation

## 2014-11-11 DIAGNOSIS — Z853 Personal history of malignant neoplasm of breast: Secondary | ICD-10-CM | POA: Insufficient documentation

## 2014-11-11 MED ORDER — NAPROXEN 375 MG PO TABS: 375.0000 mg | ORAL_TABLET | Freq: Two times a day (BID) | ORAL | Status: DC

## 2014-11-11 MED ORDER — METHOCARBAMOL 500 MG PO TABS: 500.0000 mg | ORAL_TABLET | Freq: Three times a day (TID) | ORAL | Status: DC | PRN

## 2014-11-11 MED ORDER — NAPROXEN 375 MG PO TABS
375.0000 mg | ORAL_TABLET | Freq: Two times a day (BID) | ORAL | Status: DC
Start: 2014-11-11 — End: 2014-11-11

## 2014-11-11 MED ORDER — KETOROLAC TROMETHAMINE 60 MG/2ML IM SOLN
30.0000 mg | Freq: Once | INTRAMUSCULAR | Status: AC
  Administered 2014-11-11: 30 mg via INTRAMUSCULAR
  Filled 2014-11-11: qty 2

## 2014-11-11 NOTE — ED Provider Notes (Signed)
CSN: [id number]     Arrival date & time 11/11/14  1519 History   First MD Initiated Contact with Patient 11/11/14 1528     No chief complaint on file.    (Consider location/radiation/quality/duration/timing/severity/associated sxs/prior Treatment) HPI   54 year old female with history of metastatic breast cancer to bone s/p radiation therapy, seizure, presenting for management of worsening cancer related pain. Patient reports since August 2 she has been having worsening mid to low back pain. She had a PET scan that show evidence of metastatic cancer to T11. Her pain is related to her cancer. She described pain as dull sensation with occasional sharp pain. Pain worsening with sitting and improves if she stands. No associated fever, chills, productive cough, abdominal pain or rash. She denies any new numbness or weakness. She reports being very sensitive to narcotic pain medication and therefore she takes minimal pain medication aside from ibuprofen or Motrin. Her oncologist is Dr. Jana Hakim who is currently away on vacation. She reported having worsening pain for the past several days and decided to come to ER for further pain management. States pain was intense last night, having difficulty sleeping. She also has a Pleurx to help drains pleural effusion from a lung since July of last year. She denies having any worsening symptoms with her Pleurx site aside from decreasing drainage for the past several weeks which her doctor is aware. Her primary complaint today is help with her pain. She understand that radiation treatment is likely the most helpful for her cancer related pain but will not be able to see her doctor and to 2 days from now. She cannot tolerate Vicodin, Percocet, Dilaudid, or morphine. She did take Motrin 5 hours ago.  Past Medical History  Diagnosis Date  . Seizures 2010    Isolated incident.  Marland Kitchen PONV (postoperative nausea and vomiting)   . Peripheral vascular disease 02/2010    blood  clot related to porta cath  . Breast cancer dx'd 2005/2011  . Bone metastases dx'd 05/2014  . S/P radiation therapy 07/17/2014 through 08/02/2014     Left mediastinum, left seventh rib 3250 cGy in 13 sessions    Past Surgical History  Procedure Laterality Date  . Breast lumpectomy  2005  . Axillary lymph node dissection  Dec. 2011  . Portacath placement  12/11  . Removal portacath    . Mediastinotomy chamberlain mcneil Left 06/02/2013    Procedure: MEDIASTINOTOMY CHAMBERLAIN MCNEIL;  Surgeon: [name], MD;  Location: [facility];  Service: Thoracic;  Laterality: Left;  LEFT ANTERIOR MEDIASTINOTOMY    Family History  Problem Relation Age of Onset  . COPD Mother   . Breast cancer Sister 81   Social History  Substance Use Topics  . Smoking status: Never Smoker   . Smokeless tobacco: Never Used  . Alcohol Use: No   OB History    No data available     Review of Systems  All other systems reviewed and are negative.     Allergies  Decadron; Enoxaparin; Fluconazole; Hydromorphone hcl; Morphine and related; and Tegaderm ag mesh  Home Medications   Prior to Admission medications   Medication Sig Start Date End Date Taking? Authorizing Provider  ALPRAZolam Duanne Moron) 0.5 MG tablet Take 1 tablet (0.5 mg total) by mouth 2 (two) times daily as needed for anxiety. 10/12/14   Chauncey Cruel, MD  b complex vitamins capsule Take 1 capsule by mouth daily.    Historical Provider, MD  Calcium-Magnesium-Vitamin D (CITRACAL CALCIUM+D  PO) Take by mouth. 400mg  vitamin C, 500mg  vitamin D3 2 PO DAILY    Historical Provider, MD  cholecalciferol (VITAMIN D) 1000 UNITS tablet Take 1,000 Units by mouth daily. Takes 2,000 Iu daily 06/30/14   Chauncey Cruel, MD  exemestane (AROMASIN) 25 MG tablet Take 25 mg by mouth daily after breakfast.  03/30/14   Historical Provider, MD  folic acid (FOLVITE) 1 MG tablet Take 1 mg  by mouth daily.    Historical Provider, MD  ibuprofen (ADVIL,MOTRIN) 200 MG tablet Take 200 mg by mouth every 4 (four) hours as needed.    Historical Provider, MD  Melatonin 1 MG TABS Take 3 mg by mouth at bedtime as needed (sleep).     Historical Provider, MD  oxyCODONE (OXY IR/ROXICODONE) 5 MG immediate release tablet Take 1 tablet (5 mg total) by mouth every 4 (four) hours as needed for severe pain. 08/07/14   [name], MD   There were no vitals taken for this visit. Physical Exam  Constitutional: She is oriented to person, place, and time. She appears well-developed and well-nourished. No distress.  Caucasian female, standing upright, nontoxic  HENT:  Head: Atraumatic.  Mouth/Throat: Oropharynx is clear and moist.  Eyes: Conjunctivae are normal.  Neck: Neck supple.  Cardiovascular: Normal rate and regular rhythm.   Pulmonary/Chest: Effort normal and breath sounds normal.  Abdominal: Soft.  Pleurx site to left abdomen with bandage in place. Patient does not want for me to remove the bandage to examine the site  Musculoskeletal: She exhibits tenderness (Tenderness to mid thoracic and left paralumbar region on palpation without any overlying skin changes, crepitus or step-off.). She exhibits no edema.  Neurological: She is alert and oriented to person, place, and time.  Skin: No rash noted.  Psychiatric: She has a normal mood and affect.  Nursing note and vitals reviewed.   ED Course  Procedures (including critical care time)  Patient here with acute on chronic mid back pain consistence with her cancer related pain. No signs of infection noted. She request for pain control but does not want narcotic pain medication. She can tolerate NSAIDs, therefore she will receive Toradol IM and likely discharged with naproxen. She will follow-up with her oncologist on Monday which is 2 days from now.  5:32 PM Patient reported improvement of her symptoms after receiving Toradol. She request to  be discharged. Patient will be prescribed a short course of naproxen and muscle relaxant and she will follow-up with her oncologist in 2 days for further care. Return precautions discussed.  Labs Review Labs Reviewed - No data to display  Imaging Review No results found. I, Araf Clugston, personally reviewed and evaluated these images and lab results as part of my medical decision-making.   EKG Interpretation None      MDM   Final diagnoses:  Cancer related pain    BP 151/96 mmHg  Pulse 98  Temp(Src) 97.9 F (36.6 C) (Oral)  Resp 18  SpO2 100%     Domenic Moras, PA-C 11/11/14 [place], MD 11/12/14 [phone]

## 2014-11-11 NOTE — ED Notes (Signed)
Patient here with complaints of lower back pain. Reports mastatic  cancer to the T11. Reports that she has take ibuprofen with no relief. Worse when lying, better when standing.

## 2014-11-11 NOTE — Discharge Instructions (Signed)
Please follow up with your oncologist for further management of your cancer related pain.

## 2014-11-12 ENCOUNTER — Other Ambulatory Visit: Payer: Self-pay | Admitting: Oncology

## 2014-11-13 ENCOUNTER — Telehealth: Payer: Self-pay | Admitting: *Deleted

## 2014-11-13 ENCOUNTER — Other Ambulatory Visit: Payer: Self-pay | Admitting: Oncology

## 2014-11-13 ENCOUNTER — Ambulatory Visit: Payer: 59 | Admitting: Physical Therapy

## 2014-11-13 ENCOUNTER — Other Ambulatory Visit: Payer: 59

## 2014-11-13 ENCOUNTER — Other Ambulatory Visit: Payer: Self-pay | Admitting: *Deleted

## 2014-11-13 ENCOUNTER — Ambulatory Visit (HOSPITAL_COMMUNITY)
Admission: RE | Admit: 2014-11-13 | Discharge: 2014-11-13 | Disposition: A | Discharge status: Home / Self Care | Payer: 59 | Source: Ambulatory Visit | Attending: Hematology and Oncology | Admitting: Hematology and Oncology

## 2014-11-13 DIAGNOSIS — R112 Nausea with vomiting, unspecified: Secondary | ICD-10-CM | POA: Diagnosis not present

## 2014-11-13 DIAGNOSIS — I739 Peripheral vascular disease, unspecified: Secondary | ICD-10-CM | POA: Diagnosis not present

## 2014-11-13 DIAGNOSIS — R0789 Other chest pain: Secondary | ICD-10-CM

## 2014-11-13 DIAGNOSIS — I890 Lymphedema, not elsewhere classified: Secondary | ICD-10-CM

## 2014-11-13 DIAGNOSIS — G893 Neoplasm related pain (acute) (chronic): Secondary | ICD-10-CM | POA: Diagnosis not present

## 2014-11-13 DIAGNOSIS — J90 Pleural effusion, not elsewhere classified: Secondary | ICD-10-CM | POA: Insufficient documentation

## 2014-11-13 DIAGNOSIS — Z853 Personal history of malignant neoplasm of breast: Secondary | ICD-10-CM | POA: Diagnosis not present

## 2014-11-13 DIAGNOSIS — C419 Malignant neoplasm of bone and articular cartilage, unspecified: Secondary | ICD-10-CM | POA: Diagnosis not present

## 2014-11-13 DIAGNOSIS — R918 Other nonspecific abnormal finding of lung field: Secondary | ICD-10-CM

## 2014-11-13 DIAGNOSIS — C50211 Malignant neoplasm of upper-inner quadrant of right female breast: Secondary | ICD-10-CM

## 2014-11-13 DIAGNOSIS — M549 Dorsalgia, unspecified: Secondary | ICD-10-CM | POA: Diagnosis present

## 2014-11-13 DIAGNOSIS — M545 Low back pain: Secondary | ICD-10-CM | POA: Diagnosis not present

## 2014-11-13 DIAGNOSIS — M6289 Other specified disorders of muscle: Secondary | ICD-10-CM

## 2014-11-13 DIAGNOSIS — Z79899 Other long term (current) drug therapy: Secondary | ICD-10-CM | POA: Diagnosis not present

## 2014-11-13 NOTE — Therapy (Signed)
[place], [place], [address] Phone: [phone]   Fax:  [phone]  Physical Therapy Treatment  Patient Details  Name: [name] [name] MRN: [id number] Date of Birth: 05/02/1960 Referring Provider:  Chauncey Cruel, MD  Encounter Date: 11/13/2014      PT End of Session - 11/13/14 1309    Visit Number 49   Number of Visits 29   Date for PT Re-Evaluation 02/09/15   PT Start Time 1018   PT Stop Time 1100   PT Time Calculation (min) 42 min   Activity Tolerance Patient tolerated treatment well  position changes needed due to pain   Behavior During Therapy [facility] for tasks assessed/performed      Past Medical History  Diagnosis Date  . Seizures 2010    Isolated incident.  Marland Kitchen PONV (postoperative nausea and vomiting)   . Peripheral vascular disease 02/2010    blood clot related to porta cath  . Breast cancer dx'd 2005/2011  . Bone metastases dx'd 05/2014  . S/P radiation therapy 07/17/2014 through 08/02/2014     Left mediastinum, left seventh rib 3250 cGy in 13 sessions     Past Surgical History  Procedure Laterality Date  . Breast lumpectomy  2005  . Axillary lymph node dissection  Dec. 2011  . Portacath placement  12/11  . Removal portacath    . Mediastinotomy chamberlain mcneil Left 06/02/2013    Procedure: MEDIASTINOTOMY CHAMBERLAIN MCNEIL;  Surgeon: [name], MD;  Location: [facility];  Service: Thoracic;  Laterality: Left;  LEFT ANTERIOR MEDIASTINOTOMY     There were no vitals filed for this visit.  Visit Diagnosis:  Lymphedema - Plan: PT plan of care cert/re-cert  Muscle stiffness - Plan: PT plan of care cert/re-cert  Other chest pain - Plan: PT plan of care cert/re-cert  Acute back pain - Plan: PT plan of care cert/re-cert      Subjective Assessment - 11/13/14 1018    Subjective "No  bueno."  Went to the emergency room on Saturday afternoon.  The pain was off the charts.  He gave me naproxen, a shot of toradol, and a muscle relaxant.   Currently in Pain? Yes   Pain Score 7    Pain Location Back   Pain Orientation Left;Mid;Lower   Pain Frequency Constant   Multiple Pain Sites Yes   Pain Location Axilla   Pain Orientation Right                         [facility] Adult PT Treatment/Exercise - 11/13/14 0001    Manual Therapy   Manual Lymphatic Drainage (MLD) In left sidelying, posterior interaxillary anastomosis right to left and right axillo-inguinal anastomosis; in supine, short neck; superficial and deep abdominals at lower abdomen only, right groin; in left sidelying again, right axillo-inguinal anastomosis, and right lateral breast scar area, directing towards right axillo-inguinal anastomosis; left groin.  In right sidelying, left axillo-inguinal anastomosis and posterior to that, directing toward left groin.   Manual Therapy Other (comment)  In Rt. sidelying, soft tissue work to left back, neck                         Long Term Clinic Goals - 11/10/14 1256    CC Long Term Goal  #1   Title Reports left upper back and left side pain is controlled at 4/10 or less with therapy at the  current frequency.   Status On-going   CC Long Term Goal  #2   Title Pt. will report swelling is adequately managed to enable ADL function at a consistent level.   Status On-going            Plan - 11/13/14 1309    Clinical Impression Statement Patient reports episodes of increased pain over the weekend, and poor tolerance for pain medications (which caused nausea and vomiting).  We discussed trying electrical stimulation in therapy for her pain, and she does want to try this next time.  Today she responded well to manual lymph drainage and soft tissue work, reporting significantly decreased pain after session (left back pain reducing to 4/10 and right  axilla 3/10, both from 7/10).  This is really a significant, albeit temporary, decrease in her pain level.   Pt will benefit from skilled therapeutic intervention in order to improve on the following deficits Increased edema;Pain;Increased fascial restricitons   Rehab Potential Fair   Clinical Impairments Affecting Rehab Potential active cancer   PT Frequency 2x / week   PT Duration 12 weeks   PT Treatment/Interventions Manual lymph drainage;Manual techniques   PT Next Visit Plan manual lymph drainage, soft tissue work; trial of electrical stimulation for pain relief   Consulted and Agree with Plan of Care Patient     Trial of electrical stimulation for pain relief; if beneficial, issue home TENS unit.   Problem List Patient Active Problem List   Diagnosis Date Noted  . 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 Chest wall pain 03/21/2014  . Abnormal LFTs (liver function tests) 09/12/2013  . Pleural effusion 08/18/2013  . 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

## 2014-11-13 NOTE — Telephone Encounter (Signed)
This RN spoke with pt per her concerns over the post several days as well as to review CXR obtained today.  Per [name] [name]- since last Wednesday- pain was about the same on Thursday. Friday [name] [name] states " miraculously I had no pain " Saturday pain become " from 0 to 150 " with pt proceeding to the ER.  Pt discharged post use of Toradol with decreased pain.  Sunday pain continued with pt using robaxin, naprosyn and 2.mg oxy ir-  [name] [name] took 3 doses of 2.mg oxy ir at 11 am, pm and then in the evening.  She then vomited " everything came back " at 11 pm.  Plan at present is for pt to be evaluated by IR for possible pl urex removal due to decreased drainage and thoracentesis of area of known loculated fluid not accessed by catheter.  [name] [name] does not want further medication for pain at this time.  [name] [name] is in the process of seeing Dr Valere Dross for radiation to T-11 for pain control.  Order placed for IR ev al and assess.  At present [name] [name] is scheduled to see Dr Jannifer Rodney tomorrow for follow up of above as well as possible first dose of fulvestrant.

## 2014-11-14 ENCOUNTER — Ambulatory Visit: Payer: 59

## 2014-11-14 ENCOUNTER — Other Ambulatory Visit: Payer: 59

## 2014-11-14 ENCOUNTER — Ambulatory Visit (HOSPITAL_BASED_OUTPATIENT_CLINIC_OR_DEPARTMENT_OTHER): Payer: 59 | Admitting: Oncology

## 2014-11-14 ENCOUNTER — Other Ambulatory Visit (HOSPITAL_COMMUNITY)
Admission: RE | Admit: 2014-11-14 | Discharge: 2014-11-14 | Disposition: A | Discharge status: Home / Self Care | Payer: 59 | Source: Ambulatory Visit | Attending: Oncology | Admitting: Oncology

## 2014-11-14 ENCOUNTER — Ambulatory Visit (HOSPITAL_COMMUNITY)
Admission: RE | Admit: 2014-11-14 | Discharge: 2014-11-14 | Disposition: A | Discharge status: Home / Self Care | Payer: 59 | Source: Ambulatory Visit | Attending: Oncology | Admitting: Oncology

## 2014-11-14 ENCOUNTER — Other Ambulatory Visit (HOSPITAL_COMMUNITY): Payer: Self-pay | Admitting: Interventional Radiology

## 2014-11-14 ENCOUNTER — Ambulatory Visit (HOSPITAL_BASED_OUTPATIENT_CLINIC_OR_DEPARTMENT_OTHER): Payer: 59

## 2014-11-14 DIAGNOSIS — E860 Dehydration: Secondary | ICD-10-CM

## 2014-11-14 DIAGNOSIS — C771 Secondary and unspecified malignant neoplasm of intrathoracic lymph nodes: Secondary | ICD-10-CM

## 2014-11-14 DIAGNOSIS — R0789 Other chest pain: Secondary | ICD-10-CM

## 2014-11-14 DIAGNOSIS — R945 Abnormal results of liver function studies: Secondary | ICD-10-CM

## 2014-11-14 DIAGNOSIS — J90 Pleural effusion, not elsewhere classified: Secondary | ICD-10-CM

## 2014-11-14 DIAGNOSIS — I890 Lymphedema, not elsewhere classified: Secondary | ICD-10-CM | POA: Insufficient documentation

## 2014-11-14 DIAGNOSIS — J910 Malignant pleural effusion: Secondary | ICD-10-CM | POA: Insufficient documentation

## 2014-11-14 DIAGNOSIS — C50211 Malignant neoplasm of upper-inner quadrant of right female breast: Secondary | ICD-10-CM | POA: Insufficient documentation

## 2014-11-14 DIAGNOSIS — R52 Pain, unspecified: Secondary | ICD-10-CM | POA: Diagnosis not present

## 2014-11-14 DIAGNOSIS — C7951 Secondary malignant neoplasm of bone: Secondary | ICD-10-CM | POA: Insufficient documentation

## 2014-11-14 DIAGNOSIS — R7989 Other specified abnormal findings of blood chemistry: Secondary | ICD-10-CM

## 2014-11-14 LAB — CBC WITH DIFFERENTIAL/PLATELET
BASO%: 0.7 % (ref 0.0–2.0)
Basophils Absolute: 0.1 10*3/uL (ref 0.0–0.1)
EOS ABS: 0.1 10*3/uL (ref 0.0–0.5)
EOS%: 1.4 % (ref 0.0–7.0)
HCT: 38 % (ref 34.8–46.6)
HEMOGLOBIN: 13 g/dL (ref 11.6–15.9)
LYMPH%: 8.1 % — ABNORMAL LOW (ref 14.0–49.7)
MCH: 31.8 pg (ref 25.1–34.0)
MCHC: 34.2 g/dL (ref 31.5–36.0)
MCV: 93.2 fL (ref 79.5–101.0)
MONO#: 0.7 10*3/uL (ref 0.1–0.9)
MONO%: 9.1 % (ref 0.0–14.0)
NEUT%: 80.7 % — ABNORMAL HIGH (ref 38.4–76.8)
NEUTROS ABS: 6.6 10*3/uL — AB (ref 1.5–6.5)
Platelets: 510 10*3/uL — ABNORMAL HIGH (ref 145–400)
RBC: 4.08 10*6/uL (ref 3.70–5.45)
RDW: 12.7 % (ref 11.2–14.5)
WBC: 8.2 10*3/uL (ref 3.9–10.3)
lymph#: 0.7 10*3/uL — ABNORMAL LOW (ref 0.9–3.3)

## 2014-11-14 LAB — COMPREHENSIVE METABOLIC PANEL (CC13)
ALBUMIN: 3.7 g/dL (ref 3.5–5.0)
ALT: 48 U/L (ref 0–55)
AST: 33 U/L (ref 5–34)
Alkaline Phosphatase: 115 U/L (ref 40–150)
Anion Gap: 10 mEq/L (ref 3–11)
BILIRUBIN TOTAL: 0.31 mg/dL (ref 0.20–1.20)
BUN: 10 mg/dL (ref 7.0–26.0)
CO2: 25 mEq/L (ref 22–29)
Calcium: 10.1 mg/dL (ref 8.4–10.4)
Chloride: 101 mEq/L (ref 98–109)
Creatinine: 0.7 mg/dL (ref 0.6–1.1)
EGFR: >90 mL/min/{1.73_m2} (ref >90)
GLUCOSE: 113 mg/dL (ref 70–140)
POTASSIUM: 4.4 meq/L (ref 3.5–5.1)
SODIUM: 136 meq/L (ref 136–145)
TOTAL PROTEIN: 7.6 g/dL (ref 6.4–8.3)

## 2014-11-14 MED ORDER — PROCHLORPERAZINE MALEATE 5 MG PO TABS: 5.0000 mg | ORAL_TABLET | Freq: Four times a day (QID) | ORAL | Status: DC | PRN

## 2014-11-14 MED ORDER — LIDOCAINE 5 % EX PTCH: 1.0000 | MEDICATED_PATCH | CUTANEOUS | Status: DC

## 2014-11-14 NOTE — Progress Notes (Signed)
Location/Histology of Skull: Left Parietal Skull Metastasis- 2 cm   Left rib metastases noted on 10/12/2014 CT Angio Chest    Painful bone metastases at present, if any: T11 mets  She reports pain in her middle back region, worse with sitting and standing. Robaxin and Naproxen caused nausea and vomiting.  Reports worsenting pain with Lidocaine patch  Admits to anxiety  SAFETY ISSUES:       Prior radiation?07/17/2014 through 08/02/2014: SITE/DOSE: Left mediastinum, left seventh rib 3250 cGy in 13 sessions   Pacemaker/ICD?No  Possible current pregnancy? No  Is the patient on methotrexate? No  Additional Complaints / other details: Admitted on 11/16/14 at ~ 1am for increased pain

## 2014-11-14 NOTE — Progress Notes (Signed)
[place]  Telephone:(336) [phone] Fax:(336) [phone]     ID: [name] [name] OB: January 04, 1961  MR#: [id number]  EZM#:[id number]  PCP: Pcp Not In System GYN:  [name] SU:  OTHER MD: [name], [place], [name], [name], [name], [name]  CHIEF COMPLAINT: Stage IV breast cancer  CURRENT TREATMENT: Fulvestrant  BREAST CANCER HISTORY: From doctor [name]'s intake note 03/20/2004:  "The patient is a very pleasant 54 year old female, without significant past medical history.  Her family history is significant for a sister who at age 24 was diagnosed with invasive ductal carcinoma.  She is a breast cancer survivor at age 58 now.  The patient states that she has never really had a screening mammogram until October 2005, when she felt that it was time for her to start having mammograms done on a yearly basis.  Therefore, on 01/26/04, she underwent a screening mammogram and an abnormality was detected in the upper outer right breast.  She, therefore, underwent spot compression views of both the right and the left breast.  The left breast revealed a well-defined mass in the upper outer left quadrant, present at the 2 o'clock position, measuring 1.8 cm, 6 cm from the nipple.  This, by ultrasound, was felt to be a simple cyst measuring 1.8 cm.  On the right breast, a spiculated mass was noted in the upper outer right quadrant.  The ultrasound revealed a shadowing irregular solid mass at the 10:30 position, 9 cm from the nipple, measuring 1.2 cm in greatest dimension, correlating with the spiculated mass seen on the mammogram.  The right axilla was negative ultrasonically.  Because of this, the patient underwent a needle biopsy of the right breast and the biopsy was positive invasive mammary carcinoma that showed features consistent with a high-grade invasive ductal carcinoma associated with desmoplastic stroma.  No in situ  component was seen and no definite lymphovascular invasion was identified.  On the core biopsy, the tumor measured about 0.8 cm.  Because of this, she was seen by Dr. [name] and the patient was taken to the [facility] on March 15, 2004.  She underwent a right breast lumpectomy with sentinel node biopsy.  The final pathology revealed an invasive ductal carcinoma, measuring 1.7 cm, grade 2 of 3.  Margins were free of tumor.  Atypical lobular hyperplasia was noted.  One sentinel node was removed which was negative for metastatic disease.  The tumor was staged at T1c, N0 MX.  It was estrogen receptor positive, progesterone receptor positive.  HER-2/neu was 2+.  FISH was negative.  All margins were free of tumor.  She is now seen in Medical Oncology for further evaluation and management of this newly diagnosed T1c, node negative, stage I, invasive ductal carcinoma of the right breast."  Her subsequent history is as detailed below  INTERVAL HISTORY: [name] [name] returns today for follow-up of her breast cancer accompanied by her husband [name] [name]. Earlier today she met with Dr. Vernard Gambles in interventional radiology and he plans to have her Pleurx removed next week. [name] [name] is interested in having this small amount of remaining loculated fluid in her left lung removed as well and interventional radiology has promised to try.  Since the last visit here [name] [name]'s left chest wall pain has become unbearable. She has an appointment with Dr. Valere Dross in 2 days to discuss further radiation to the chest wall which she hopes will relieve the pain. In the meantime she has been  taking one quarter or one half of an oxycodone tablet which she says cuts the edge of the pain for 2 or 3 hours at a time. From the story as I got a today it doesn't sound like [name] [name] her husband have had any sleep for the last several days. She also has had no bowel movement in the last 48 hours, had a small 1 about 2-1/2 days ago.   REVIEW OF SYSTEMS: Chia's  pain localizes to the lateral aspect of her left chest wall but it can move and sometimes it is more superior and posterior. Generally she can put an ice pack on it and that relieves it for a period of time. She vomited after taking a full oxycodone and is afraid of taking a full pill for that reason. She has "something" for nausea at home, but she is not sure what it is and she is reluctant to take that because it can affect her mood and make her sleepy and because she doesn't feel anti-emetics while work well for her. In general she would prefer to take no medication but of course she is now in a situation where that has become inevitable. She denies unusual headaches visual changes or neck stiffness. A detailed review of systems today was otherwise stable  PAST MEDICAL HISTORY: Past Medical History  Diagnosis Date  . Seizures 2010    Isolated incident.  Marland Kitchen PONV (postoperative nausea and vomiting)   . Peripheral vascular disease 02/2010    blood clot related to porta cath  . Breast cancer dx'd 2005/2011  . Bone metastases dx'd 05/2014  . S/P radiation therapy 07/17/2014 through 08/02/2014     Left mediastinum, left seventh rib 3250 cGy in 13 sessions     PAST SURGICAL HISTORY: Past Surgical History  Procedure Laterality Date  . Breast lumpectomy  2005  . Axillary lymph node dissection  Dec. 2011  . Portacath placement  12/11  . Removal portacath    . Mediastinotomy chamberlain mcneil Left 06/02/2013    Procedure: MEDIASTINOTOMY CHAMBERLAIN MCNEIL;  Surgeon: [name], MD;  Location: [facility] OR;  Service: Thoracic;  Laterality: Left;  LEFT ANTERIOR MEDIASTINOTOMY     FAMILY HISTORY Family History  Problem Relation Age of Onset  . COPD Mother   . Breast cancer Sister 66   The patient's father is living, 15 years old as of may 2015. He lives in [place]. The patient's mother died from complications of  COPD at the age of 33. These has 2 brothers, one sister. Her sister developed breast cancer at the age of 17. She is doing well. The patient herself underwent genetic testing at [facility] in 2011 and was found to be BRCA negative  GYNECOLOGIC HISTORY:  Menarche age 5, she is GX P0. She stopped having periods with her initial chemotherapy in 2006.  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 Her husband [name] [name] is a Medical illustrator in [place]. He has a child from a prior marriage. At home they have 2 rescue dogs, Hobo and [place]. The patient is religious but not a church attender    ADVANCED DIRECTIVES: In place; at the 08/04/2014 visit in particular the patient was very clear, with her husband present, that she would not want any kind of feeding tubes or "other tubes" if her condition deteriorated.   HEALTH MAINTENANCE: Social History  Substance Use Topics  . Smoking status: Never  Smoker   . Smokeless tobacco: Never Used  . Alcohol Use: No     Colonoscopy:  PAP:  Bone density: March 2015; mild osteopenia  Lipid panel:  Allergies  Allergen Reactions  . Decadron [Dexamethasone] Other (See Comments)    Patient does not tolerate steroids.   . Dilaudid [Hydromorphone]   . Enoxaparin Other (See Comments)    unknown  . Fluconazole Swelling  . Hydromorphone Hcl Nausea And Vomiting  . Morphine And Related   . Tegaderm Ag Mesh [Silver]     Current Outpatient Prescriptions  Medication Sig Dispense Refill  . ALPRAZolam (XANAX) 0.5 MG tablet Take 1 tablet (0.5 mg total) by mouth 2 (two) times daily as needed for anxiety. (Patient taking differently: Take 0.125 mg by mouth 2 (two) times daily as needed for anxiety. ) 30 tablet 0  . b complex vitamins capsule Take 1 capsule by mouth daily.    . Calcium-Magnesium-Vitamin D (CITRACAL CALCIUM+D PO) Take by mouth. 463m vitamin C, 5034mvitamin D3 2 PO DAILY    .  cholecalciferol (VITAMIN D) 1000 UNITS tablet Take 1,000 Units by mouth daily. Takes 2,000 Iu daily    . exemestane (AROMASIN) 25 MG tablet Take 25 mg by mouth daily after breakfast.   4  . folic acid (FOLVITE) 1 MG tablet Take 1 mg by mouth daily.    . Marland Kitchenbuprofen (ADVIL,MOTRIN) 200 MG tablet Take 400 mg by mouth every 4 (four) hours as needed for moderate pain.     . Marland Kitchenidocaine (LIDODERM) 5 % Place 1 patch onto the skin daily. Remove & Discard patch within 12 hours or as directed by MD 30 patch 3  . MAGNESIUM PO Take 0.5 tablets by mouth daily as needed (constipation).    . Melatonin 1 MG TABS Take 3 mg by mouth at bedtime as needed (sleep).     . methocarbamol (ROBAXIN) 500 MG tablet Take 1 tablet (500 mg total) by mouth every 8 (eight) hours as needed for muscle spasms. 20 tablet 0  . naproxen (NAPROSYN) 375 MG tablet Take 1 tablet (375 mg total) by mouth 2 (two) times daily. 20 tablet 0  . oxyCODONE (OXY IR/ROXICODONE) 5 MG immediate release tablet Take 1 tablet (5 mg total) by mouth every 4 (four) hours as needed for severe pain. (Patient taking differently: Take 1.25 mg by mouth every 4 (four) hours as needed for severe pain. ) 30 tablet 0  . prochlorperazine (COMPAZINE) 5 MG tablet Take 1 tablet (5 mg total) by mouth every 6 (six) hours as needed for nausea or vomiting. 30 tablet 0  . [DISCONTINUED] metoCLOPramide (REGLAN) 10 MG tablet Take 1 tablet (10 mg total) by mouth 4 (four) times daily -  before meals and at bedtime. 60 tablet 3   No current facility-administered medications for this visit.    OBJECTIVE: Middle-aged white woman who refuses weight and vital signs There were no vitals filed for this visit.   There is no weight on file to calculate BMI.   There were no vitals filed for this visit.    Patient refused vitals    ECOG FS:2 - Symptomatic, <50% confined to bed  Sclerae unicteric, EOMs intact No cervical or supraclavicular adenopathy Lungs no rales or rhonchi Heart  regular rate and rhythm Abd soft, nontender, positive bowel sounds MSK pain localizes to the lateral aspect of the left chest wall, but there is no tenderness to palpation Neuro: nonfocal, distraught affect Breasts: Deferred  LAB RESULTS:  CMP     Component Value Date/Time   NA 136 11/14/2014 1328   NA 134* 09/03/2013 2145   K 4.4 11/14/2014 1328   K 3.7 09/03/2013 2145   CL 97 09/03/2013 2145   CL 105 05/06/2012 1333   CO2 25 11/14/2014 1328   CO2 23 09/03/2013 2145   GLUCOSE 113 11/14/2014 1328   GLUCOSE 121* 09/03/2013 2145   GLUCOSE 124* 05/06/2012 1333   BUN 10.0 11/14/2014 1328   BUN 8 09/03/2013 2145   CREATININE 0.7 11/14/2014 1328   CREATININE 0.64 09/03/2013 2145   CALCIUM 10.1 11/14/2014 1328   CALCIUM 9.0 09/03/2013 2145   PROT 7.6 11/14/2014 1328   PROT 6.8 09/03/2013 2145   ALBUMIN 3.7 11/14/2014 1328   ALBUMIN 3.3* 09/03/2013 2145   AST 33 11/14/2014 1328   AST 55* 09/03/2013 2145   ALT 48 11/14/2014 1328   ALT 85* 09/03/2013 2145   ALKPHOS 115 11/14/2014 1328   ALKPHOS 114 09/03/2013 2145   BILITOT 0.31 11/14/2014 1328   BILITOT 0.3 09/03/2013 2145   GFRNONAA >90 09/03/2013 2145   GFRAA >90 09/03/2013 2145    No results found for: SPEP  Lab Results  Component Value Date   WBC 8.2 11/14/2014   NEUTROABS 6.6* 11/14/2014   HGB 13.0 11/14/2014   HCT 38.0 11/14/2014   MCV 93.2 11/14/2014   PLT 510* 11/14/2014      Chemistry      Component Value Date/Time   NA 136 11/14/2014 1328   NA 134* 09/03/2013 2145   K 4.4 11/14/2014 1328   K 3.7 09/03/2013 2145   CL 97 09/03/2013 2145   CL 105 05/06/2012 1333   CO2 25 11/14/2014 1328   CO2 23 09/03/2013 2145   BUN 10.0 11/14/2014 1328   BUN 8 09/03/2013 2145   CREATININE 0.7 11/14/2014 1328   CREATININE 0.64 09/03/2013 2145      Component Value Date/Time   CALCIUM 10.1 11/14/2014 1328   CALCIUM 9.0 09/03/2013 2145   ALKPHOS 115 11/14/2014 1328   ALKPHOS 114 09/03/2013 2145   AST 33  11/14/2014 1328   AST 55* 09/03/2013 2145   ALT 48 11/14/2014 1328   ALT 85* 09/03/2013 2145   BILITOT 0.31 11/14/2014 1328   BILITOT 0.3 09/03/2013 2145       Lab Results  Component Value Date   LABCA2 25 11/02/2007    No components found for: QIHKV425  No results for input(s): INR in the last 168 hours.  Urinalysis    Component Value Date/Time   COLORURINE YELLOW 09/03/2013 2109   APPEARANCEUR CLEAR 09/03/2013 2109   LABSPEC 1.005 09/12/2013 1542   LABSPEC 1.016 09/03/2013 2109   PHURINE 6.0 09/12/2013 1542   PHURINE 6.0 09/03/2013 2109   GLUCOSEU Negative 09/12/2013 [place] 09/03/2013 2109   HGBUR Negative 09/12/2013 [place] 09/03/2013 2109   BILIRUBINUR Negative 09/12/2013 [place] 09/03/2013 2109   KETONESUR Negative 09/12/2013 [place] 09/03/2013 2109   PROTEINUR Negative 09/12/2013 [place] 09/03/2013 2109   UROBILINOGEN 0.2 09/12/2013 1542   UROBILINOGEN 0.2 09/03/2013 2109   NITRITE Negative 09/12/2013 1542   NITRITE NEGATIVE 09/03/2013 2109   LEUKOCYTESUR Negative 09/12/2013 1542   LEUKOCYTESUR NEGATIVE 09/03/2013 2109    STUDIES: Dg Chest 2 View  11/13/2014   CLINICAL DATA:  Follow-up of pleural effusion  EXAM: CHEST  2 VIEW  COMPARISON:  PA and  lateral chest x-ray of November 08, 2014  FINDINGS: The right lung is adequately inflated and clear. On the left there is a persistent moderate-sized pleural effusion. The left-sided chest tube is unchanged in position with its tip projecting over the posterior aspect of the fifth rib. There is patchy alveolar opacity in the left upper lobe which is stable. The left hemidiaphragm is elevated and obscured. There is curvature of the thoracolumbar spine and S-shaped configuration. There is a nonspecific gas pattern in the upper abdomen.  IMPRESSION: Stable appearance of the moderate-sized left pleural effusion and patchy alveolar opacity  in the left upper lobe. The left chest tube is unchanged in position with its tip above the pleural effusion at the level of the posterior fifth rib.   Electronically Signed   By: David  Martinique M.D.   On: 11/13/2014 11:47   Dg Chest 2 View  11/08/2014   CLINICAL DATA:  History of breast cancer and left pleural catheter. Evaluate for a left pleural effusion. Decreased fluid drainage but fullness and pain in the left lower chest.  EXAM: CHEST  2 VIEW  COMPARISON:  10/05/2014 and 10/30/2014  FINDINGS: Stable position of the left pleural catheter. There are densities in the left upper lobe that correspond with the nodular disease seen on the PET-CT from 10/30/2014. There continues to be a pleural collection at the left lung base which has minimally changed in size. Right lung remains clear. Surgical clips in the right axilla. Heart and mediastinum are stable. Negative for a pneumothorax.  IMPRESSION: Persistent parenchymal densities in the left upper lobe which were present on the recent PET-CT. These densities are nonspecific and may be better characterized with a follow-up chest CT.  Stable position of the left chest tube. There is persistent loculated fluid at the left lung base which has minimally changed.   Electronically Signed   By: Markus Daft M.D.   On: 11/08/2014 09:47   Mr Jeri Cos VZ Contrast  10/18/2014   CLINICAL DATA:  History of right upper inner quadrant breast cancer. Prior seizure in 06/2008. Evaluation for metastatic disease.  EXAM: MRI HEAD WITHOUT AND WITH CONTRAST  TECHNIQUE: Multiplanar, multiecho pulse sequences of the brain and surrounding structures were obtained without and with intravenous contrast.  CONTRAST:  18m MULTIHANCE GADOBENATE DIMEGLUMINE 529 MG/ML IV SOLN  COMPARISON:  [facility] through the temporal lobes demonstrates normal volume and signal of the hippocampi. There is no evidence  of acute infarct, intracranial hemorrhage, intra-axial mass, midline shift, or extra-axial fluid collection. There is mild generalized cerebral atrophy, unchanged. No significant cerebral white matter disease is identified. No enhancing parenchymal brain lesions are identified.  There is a new 2 cm enhancing lesion in the left parietal bone which extends through the inner table to the dura, which appears mildly thickened. Orbits are unremarkable. No significant inflammatory disease is seen in the paranasal sinuses or mastoid air cells. Major intracranial vascular flow voids are preserved.  IMPRESSION: 1. No evidence of brain metastases. 2. 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   Electronically Signed   By: ALogan Bores  On: 10/18/2014 16:12   Nm Pet Image Restag (ps) Skull Base To Thigh  10/30/2014   CLINICAL DATA:  Subsequent treatment strategy for breast cancer.  EXAM: NUCLEAR MEDICINE PET SKULL BASE TO THIGH  TECHNIQUE: 8.0 mCi F-18 FDG was  injected intravenously. Full-ring PET imaging was performed from the skull base to thigh after the radiotracer. CT data was obtained and used for attenuation correction and anatomic localization.  FASTING BLOOD GLUCOSE:  Value: 88 mg/dl  COMPARISON:  03/29/2014  FINDINGS: NECK  No hypermetabolic lymph nodes in the neck.  CHEST  Left axillary node measures 0.9 cm and has an SUV max equal to 11.05. New from previous exam. Resolution of previous hyper metabolism associated with the sub- carinal lymph node. There is a new hypermetabolic left infrahilar lymph node which has an SUV max equal to 6.8.  Left-sided chest tube is in place. The loculated left pleural effusion is stable to decreased in volume from previous exam. Again noted is extensive hypermetabolic trans pleural spread of tumor involving the left hemi thorax. Anterior paramediastinal pleural tumor measures 5 x 1.6 cm and has an SUV max equal to 4.28. On the previous  exam this measured 7.3 x 3.3 cm and had an SUV max equal to 14.28.Tumor invading the ventral chest wall appears decreased from previous exam. On today's study this measures approximately 1.3 cm in thickness and has an SUV max equal to 6.24. On the previous exam this measure 2.1 cm in thickness and had an SUV max equal to 14.28. Bulky pleural tumor at the posterior medial left base is again noted involving the T11 neuro foramina and extending into the last lateral recess of the canal it this level. On today's study this tumor measures approximately 5.9 cm and has an SUV max equal to 10.5. Previously this measured approximately 5.4 cm and had an SUV max equal to 12.9.  Patchy areas of consolidation and ground-glass attenuation are identified within the left lung and appear new from previous exam. These are nonspecific and may reflect changes of external beam radiation. Pulmonary parenchymal metastasis not excluded. Index nodule within the left upper lobe measures 2.1 cm and has an SUV max equal to 3.7. Within the superior segment of the right lower lobe there is a new parenchymal nodule measuring 11 mm within SUV max equal to 2.9.  ABDOMEN/PELVIS  No abnormal hypermetabolic activity within the liver, pancreas, adrenal glands, or spleen. 1.6 cm periaortic lymph node is identified within SUV max equal to 11.6. Previously this lymph node measured 1.4 cm and had an SUV max equal 7.5. Adjacent lymph node measures 1.1 cm and has an SUV max of 8.4. Previously this mesh 0.9 cm and had an SUV max equal to 9.28. More caudal cluster of periaortic lymph nodes measures 2.5 cm and has an SUV max equal to 6.3 cm. Previously 1.9 cm within SUV max equal to 6.3. No enlarged or hypermetabolic pelvic or inguinal lymph nodes.  SKELETON  No focal hypermetabolic activity to suggest skeletal metastasis.  IMPRESSION: 1. Interval mixed response to therapy. Specifically, the bulky pleural based tumor along the paramediastinal left upper lobe  extending into the ventral chest wall has decreased in size and degree of FDG uptake. Additionally, there is been resolution of previous hyper metabolism associated with the sub- carinal adenopathy. Persistent pleural based tumor overlying the posterior and medial left lung base is again noted. There is continued extension into the T11 neuro foramina and lateral recess of the canal. 2. New hypermetabolic left axillary and left hilar adenopathy. Additionally, persistent hypermetabolic adenopathy within the upper abdomen demonstrates mild increase in size in the interval. 3. Bilateral pulmonary nodular opacities are identified and are nonspecific. These exhibit malignant range FDG uptake and are new from  previous exam. Findings may reflect nonspecific pneumonitis and may also be seen with changes of external beam radiation. Pulmonary metastasis not excluded.   Electronically Signed   By: Kerby Moors M.D.   On: 10/30/2014 16:12   Ir Radiologist Eval & Mgmt  11/14/2014   CLINICAL DATA:  Referring physician: Lurline Del  Reason for consultation: Left PleurX  Past medical history: Pleural effusionBreast cancer of upper-inner quadrant of right female breast, Secondary malignant neoplasm of mediastinal lymph node, Abnormal LFTs (liver function tests), Chest wall pain, Post-lymphadenectomy lymphedema of arm  HISTORY OF PRESENT ILLNESS: Metastatic breast carcinoma. Large malignant left pleural effusion. PleurX catheter placed 09/01/2013 by Dr. Pascal Lux. Decreasing output since July. PET-CT 10/30/2014 demonstrated residual loculated subpulmonic effusion with extensive associated pleural metastatic disease. This has remained grossly stable on subsequent chest x-rays through 11/13/2014. The remainder of the fairly extensive effusion seen preprocedure has resolved. There is involvement of a left lower thoracic neural foramen from extension of pleural disease. She is not having any problems at the skin entry site of the  PleurX catheter.  PROCEDURE: We connected the catheter up for drainage but could only get about 10 cc.  ASSESSMENT AND PLAN: My impression is that she has had extensive pleurodesis secondary to the presence of the PleurX catheter with resolution of much of the previously noted malignant left pleural effusion. The residual loculated subpulmonic fluid is associated with a moderate amount of adjacent pleural metastatic disease and and not amenable to repositioning of the catheter. In fact, this may not even significantly decompress with thoracentesis if it is loculated or inaccessible due to overlying tumor. I discussed the findings with the patient and her spouse.  My recommendation is that we remove the catheter. She is interested in trying thoracentesis, to aspirate the residual subpulmonic effusion at the same time, in hopes of some symptomatic relief, and I think it would be a reasonable to look with ultrasound to see if this is feasible. We can schedule this next week with Dr. Pascal Lux as she has requested.   Electronically Signed   By: Lucrezia Europe M.D.   On: 11/14/2014 16:21    ASSESSMENT: 54 y.o. [place] woman with stage IV breast cancer, history as follows  (1)  S/p Right lumpectomy and sentinel lymph node sampling 03/15/2004 for a pT1c pN0. Stage IA invasive ductal carcinoma, grade 2, estrogen receptor 95% positive, progesterone receptor 65% positive, HER-2 not amplified; additional surgery 04/25/2004 for seroma or clearance showed no residual tumor  (2) adjuvant chemotherapy with cyclophosphamide and doxorubicin every 21 days x4 completed 07/19/2004  (3) adjuvant radiation given under Dr. Donella Stade in [place] completed July 2006  (4) the patient opted against adjuvant antiestrogen therapy  (5) genetics testing showed no BRCA mutations  (6) biopsy of a palpable right axillary mass 10/24/2009 showed invasive ductal carcinoma, grade 3, estrogen receptor 100% positive, progesterone receptor 2%  positive (alert score 5) HER-2 negative; no evidence of systemic disease on PET scanning  (7) completed 3 of 4 planned cycles of docetaxel and cyclophosphamide September 2011, fourth cycle omitted because of marked elevations in liver function tests  (8) an right axillary lymph node dissection 03/06/2010 showed 3/8 lymph nodes removed to be involved by tumor, with extracapsular extension.  (9) 45 Gy radiation to the right axillary and right supraclavicular nodal areas, with capecitabine sensitization, completed March 2012   (10) intolerant of letrozole and exemestane; on tamoxifen with interruptions September 2012 to March 2013, but then continuing on tamoxifen more  continuously through March of 2015  (11) biopsy of mediastinal adenopathy 06/02/2013 shows invasive ductal carcinoma (gross cystic disease fluid protein positive, TTS-1 negative), estrogen receptor 80% positive, progesterone receptor 2% positive, HER-2 not amplified  (12) letrozole started March 2015-- tolerated with significant side effects, discontinued at the end of May 2015  (13) PET scan 08/16/2013 shows extensive left pleural metastatic disease and a large left pleural effusion that shifts cardiac and mediastinal structures to the right; adenopathy (celiac trunk, periadrenal, periaortic); and a left medial clavicular lesion; Status post left thoracentesis 08/16/2013 positive for adenocarcinoma, estrogen receptor positive, progesterone receptor negative.  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 mutation; everolimus was suggested as an available targeted agent  (18) exemestane started 03/31/2014, discontinued 10/31/2014 with evidence of progression  (a) everolimus added 04/03/2014 but not tolerated (cytopenias, elevated LFTs) even at minimal doses; stopped 04/17/2014  (19) fulvestrant to start 11/14/2014-- postponed    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 treated with Lovenox for 5-6 months  (c) right upper extremity lymphedema  (d) hepatic steatosis with chronically elevated LFTs as well as unusual hepatic sensitivity to chemotherapy  (e) osteopenia with the lowest T score -1.6 on bone density scan 06/20/2013  (f) radiation oncology (Dr Valere Dross) has reviewed prior radiation records in case there is further mediastinal involvement with dysphagia etc in which case palliative XRT could be considered  (a) radiation to left mediastinum/ left 7th rib 3250 cGy in 13 sessions04/18/2016 through 08/02/2014  (g) chest wall pain   PLAN: Laytoya's pain was uncontrolled today. She has been taking one quarter to half an oxycodone tablet which she says "cuts the edge" of the pain. She is reluctant to take any more because it causes nausea and vomiting. She is reluctant to take anti-emetics because "they don't work for me.".    I offered to admit her for pain control but she was absolutely against that.  Since she gets some relief from ice, she may benefit significantly from the Lidoderm patches. I went ahead and wrote her for those and explained how to use them. I also wrote her for 60 oxycodone, 5 mg, with no Tylenol. She is to take a minimum of 4 doses a day but she can take between one half and 1 tablet every 2 hours as needed for pain.  Clearly she is going to be further constipated from this so she will take 2 Colace twice a day. She will  also continue taking magnesium. In addition she may take Motrin or Aleve at her discretion to control the pain.  All these instructions were given to her in writing.  She will call us tomorrow and let us know how she is doing as far as pain control is concerned and then she will stop by 2 days from now after she meets with Dr. Valere Dross regarding her planned radiation treatments.  Her Pleurx catheter is no longer functioning. She will have that pulled next week. She has requested to have the small amount  of loculated fluid in the lung removed as well. That likely will make very little difference to her pain control but radiology a promises to make a try at removing that.  With all this we have not been able to start the fulvestrant as planned for today. She is already scheduled to see me and to have a further dose of fulvestrant on August 30 and we will leave that in place.  Parnika knows that if her pain is not controlled with his maneuvers she can go to the emergency room on the admitted for more intensive treatment.     Chauncey Cruel, MD   11/14/2014 5:22 PM

## 2014-11-15 ENCOUNTER — Other Ambulatory Visit: Payer: Self-pay | Admitting: Oncology

## 2014-11-15 ENCOUNTER — Telehealth: Payer: Self-pay

## 2014-11-15 ENCOUNTER — Observation Stay (HOSPITAL_COMMUNITY)
Admission: EM | Admit: 2014-11-15 | Discharge: 2014-11-15 | Disposition: A | Discharge status: Home / Self Care | Payer: 59 | Attending: Internal Medicine | Admitting: Internal Medicine

## 2014-11-15 ENCOUNTER — Encounter (HOSPITAL_COMMUNITY): Payer: Self-pay | Admitting: *Deleted

## 2014-11-15 ENCOUNTER — Ambulatory Visit: Payer: 59 | Admitting: Physical Therapy

## 2014-11-15 DIAGNOSIS — C50211 Malignant neoplasm of upper-inner quadrant of right female breast: Secondary | ICD-10-CM | POA: Diagnosis not present

## 2014-11-15 DIAGNOSIS — M545 Low back pain, unspecified: Secondary | ICD-10-CM

## 2014-11-15 DIAGNOSIS — J90 Pleural effusion, not elsewhere classified: Secondary | ICD-10-CM

## 2014-11-15 DIAGNOSIS — Z79899 Other long term (current) drug therapy: Secondary | ICD-10-CM | POA: Insufficient documentation

## 2014-11-15 DIAGNOSIS — C50919 Malignant neoplasm of unspecified site of unspecified female breast: Secondary | ICD-10-CM | POA: Diagnosis present

## 2014-11-15 DIAGNOSIS — Z853 Personal history of malignant neoplasm of breast: Secondary | ICD-10-CM | POA: Insufficient documentation

## 2014-11-15 DIAGNOSIS — G893 Neoplasm related pain (acute) (chronic): Secondary | ICD-10-CM | POA: Insufficient documentation

## 2014-11-15 DIAGNOSIS — R112 Nausea with vomiting, unspecified: Secondary | ICD-10-CM | POA: Insufficient documentation

## 2014-11-15 DIAGNOSIS — R52 Pain, unspecified: Secondary | ICD-10-CM

## 2014-11-15 DIAGNOSIS — M549 Dorsalgia, unspecified: Secondary | ICD-10-CM | POA: Diagnosis not present

## 2014-11-15 DIAGNOSIS — C801 Malignant (primary) neoplasm, unspecified: Secondary | ICD-10-CM

## 2014-11-15 DIAGNOSIS — I739 Peripheral vascular disease, unspecified: Secondary | ICD-10-CM | POA: Insufficient documentation

## 2014-11-15 DIAGNOSIS — C7951 Secondary malignant neoplasm of bone: Secondary | ICD-10-CM

## 2014-11-15 DIAGNOSIS — C419 Malignant neoplasm of bone and articular cartilage, unspecified: Secondary | ICD-10-CM | POA: Insufficient documentation

## 2014-11-15 DIAGNOSIS — F419 Anxiety disorder, unspecified: Secondary | ICD-10-CM

## 2014-11-15 LAB — BASIC METABOLIC PANEL
ANION GAP: 7 (ref 5–15)
BUN: 13 mg/dL (ref 6–20)
CO2: 30 mmol/L (ref 22–32)
Calcium: 9.3 mg/dL (ref 8.9–10.3)
Chloride: 104 mmol/L (ref 101–111)
Creatinine, Ser: 0.88 mg/dL (ref 0.44–1.00)
GFR calc Af Amer: >60 mL/min (ref >60)
GLUCOSE: 104 mg/dL — AB (ref 65–99)
POTASSIUM: 4.7 mmol/L (ref 3.5–5.1)
Sodium: 141 mmol/L (ref 135–145)

## 2014-11-15 LAB — CBC WITH DIFFERENTIAL/PLATELET
Basophils Absolute: 0 10*3/uL (ref 0.0–0.1)
Basophils Relative: 0 % (ref 0–1)
Eosinophils Absolute: 0.2 10*3/uL (ref 0.0–0.7)
Eosinophils Relative: 2 % (ref 0–5)
HEMATOCRIT: 47.2 % — AB (ref 36.0–46.0)
Hemoglobin: 16.8 g/dL — ABNORMAL HIGH (ref 12.0–15.0)
LYMPHS PCT: 28 % (ref 12–46)
Lymphs Abs: 2.2 10*3/uL (ref 0.7–4.0)
MCH: 33.5 pg (ref 26.0–34.0)
MCHC: 35.6 g/dL (ref 30.0–36.0)
MCV: 94.2 fL (ref 78.0–100.0)
MONO ABS: 1 10*3/uL (ref 0.1–1.0)
Monocytes Relative: 12 % (ref 3–12)
NEUTROS ABS: 4.7 10*3/uL (ref 1.7–7.7)
Neutrophils Relative %: 58 % (ref 43–77)
Platelets: 202 10*3/uL (ref 150–400)
RBC: 5.01 MIL/uL (ref 3.87–5.11)
RDW: 12.6 % (ref 11.5–15.5)
WBC: 8.1 10*3/uL (ref 4.0–10.5)

## 2014-11-15 MED ORDER — ONDANSETRON HCL 4 MG PO TABS
4.0000 mg | ORAL_TABLET | Freq: Four times a day (QID) | ORAL | Status: DC | PRN
Start: 2014-11-15 — End: 2014-11-15

## 2014-11-15 MED ORDER — SODIUM CHLORIDE 0.9 % IV SOLN
INTRAVENOUS | Status: DC
  Administered 2014-11-15: 08:00:00 via INTRAVENOUS

## 2014-11-15 MED ORDER — SENNOSIDES-DOCUSATE SODIUM 8.6-50 MG PO TABS: 1.0000 | ORAL_TABLET | Freq: Every evening | ORAL | Status: DC | PRN

## 2014-11-15 MED ORDER — KETOROLAC TROMETHAMINE 30 MG/ML IJ SOLN
30.0000 mg | Freq: Four times a day (QID) | INTRAMUSCULAR | Status: DC | PRN
  Administered 2014-11-15: 30 mg via INTRAVENOUS
  Filled 2014-11-15: qty 1

## 2014-11-15 MED ORDER — SODIUM CHLORIDE 0.9 % IJ SOLN: 3.0000 mL | Freq: Two times a day (BID) | INTRAMUSCULAR | Status: DC

## 2014-11-15 MED ORDER — ALPRAZOLAM 0.25 MG PO TABS: 0.1250 mg | ORAL_TABLET | Freq: Two times a day (BID) | ORAL | Status: DC | PRN

## 2014-11-15 MED ORDER — KETOROLAC TROMETHAMINE 30 MG/ML IJ SOLN
30.0000 mg | Freq: Once | INTRAMUSCULAR | Status: AC
  Administered 2014-11-15: 30 mg via INTRAVENOUS
  Filled 2014-11-15: qty 1

## 2014-11-15 MED ORDER — ONDANSETRON HCL 4 MG/2ML IJ SOLN
4.0000 mg | Freq: Four times a day (QID) | INTRAMUSCULAR | Status: DC | PRN
  Administered 2014-11-15: 4 mg via INTRAVENOUS
  Filled 2014-11-15: qty 2

## 2014-11-15 MED ORDER — OXYCODONE HCL 5 MG PO TABS: 2.5000 mg | ORAL_TABLET | ORAL | Status: DC | PRN

## 2014-11-15 MED ORDER — ONDANSETRON 4 MG PO TBDP
4.0000 mg | ORAL_TABLET | Freq: Once | ORAL | Status: AC
  Administered 2014-11-15: 4 mg via ORAL
  Filled 2014-11-15: qty 1

## 2014-11-15 MED ORDER — METOCLOPRAMIDE HCL 5 MG/ML IJ SOLN
10.0000 mg | INTRAMUSCULAR | Status: AC
  Administered 2014-11-15: 10 mg via INTRAVENOUS
  Filled 2014-11-15: qty 2

## 2014-11-15 MED ORDER — ACETAMINOPHEN 325 MG PO TABS
650.0000 mg | ORAL_TABLET | Freq: Four times a day (QID) | ORAL | Status: DC | PRN
  Administered 2014-11-15: 650 mg via ORAL
  Filled 2014-11-15: qty 2

## 2014-11-15 MED ORDER — ACETAMINOPHEN 325 MG PO TABS: 650.0000 mg | ORAL_TABLET | Freq: Two times a day (BID) | ORAL | Status: DC | PRN

## 2014-11-15 MED ORDER — ACETAMINOPHEN 650 MG RE SUPP: 650.0000 mg | Freq: Four times a day (QID) | RECTAL | Status: DC | PRN

## 2014-11-15 MED ORDER — PROCHLORPERAZINE MALEATE 10 MG PO TABS
5.0000 mg | ORAL_TABLET | Freq: Four times a day (QID) | ORAL | Status: DC | PRN
  Filled 2014-11-15: qty 1

## 2014-11-15 MED ORDER — FOLIC ACID 1 MG PO TABS
1.0000 mg | ORAL_TABLET | Freq: Every day | ORAL | Status: DC
  Filled 2014-11-15: qty 1

## 2014-11-15 MED ORDER — ONDANSETRON HCL 4 MG PO TABS: 4.0000 mg | ORAL_TABLET | Freq: Four times a day (QID) | ORAL | Status: DC | PRN

## 2014-11-15 NOTE — Discharge Summary (Signed)
Discharge Summary  [name] [name] VHQ:[id number] DOB: 11/28/1960  PCP: Pcp Not In System  Admit date: 11/15/2014 Discharge date: 11/15/2014  Time spent: <55mins  Recommendations for Outpatient Follow-up:  1. F/u with radiation oncology on 8/18  Discharge Diagnoses:  Active Hospital Problems   Diagnosis Date Noted  . Back pain 11/15/2014  . 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 Pleural effusion 08/18/2013    Resolved Hospital Problems   Diagnosis Date Noted Date Resolved  No resolved problems to display.    Discharge Condition: stable  Diet recommendation: regular diet  Filed Weights   11/15/14 0319 11/15/14 0846  Weight: 72.576 kg (160 lb) 72.576 kg (160 lb)    History of present illness:  [name] [name] is a 54 y.o. female with Past medical history of breast cancer stage IV, pleural effusion, T11 metastasis, Pleurx catheter. The patient is presenting with complains of back pain located around her middle back. This pain has been ongoing for last 2 days. She was seen in the ER on 11/11/2014 with this pain. The pain was described as worsening with sitting and improved with standing. She was prescribed Robaxin and was recommended to take naproxen and she took it over the weekends but she had an episode of vomiting and after that she did not take any of his medications. She tried to take one quarter of oxycodone every 3-4 hours for pain management along with Motrin. And after further dose of oxycodone she was started having vomiting and cannot tolerate any more oxycodone. The patient is significantly anxious and is shaking her hands on the right side and anxiety. She denies any loss of control of bowel or bladder any numbness or weakness in her legs or any radiation of the pain in her legs. She was prescribed lidocaine patch by her oncologist but patient mentions after  wearing the patch, she had worsening pain where she was wearing the patch. She never had any pain on the site of the pain patch application before the application.  The patient is coming from home.  At her baseline ambulates without any support And is independent for most of her ADL manages her medication on her own.   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 Back pain Presented with uncontrollable back pain. She constantly mention of this is related to her T11 metastasis. She does not have any radicular symptoms. She was recommended to use a muscle relaxant but she refused to take it. She refused lidocaine patch as well. At present she has only agreed to use Toradol as needed with oxycodone and ice application in between as needed. Patient is scheduled for outpatient follow-up with radiation oncology to discuss about chest wall pain management, but she may also need discussion regarding back pain management, since she is refusing available pain management option due to intolerance. Pain has much improved with iv toradol, patient requested to be discharged home with about meds and close follow up with radiation oncology tomorrow on 8/18.  2. Pleural effusion. Patient has a pleural catheter which is scheduled to be removed next week. Continue close monitoring.  3. Breast cancer. The patient was on exemestane, and was scheduled to be changed to fulvestrant, which is currently on hold.  Outpatient oncology follow up  4.Anxiety. Continue home meds.   Advance goals of care discussion: Full code    Family Communication: family was present at bedside  Procedures:  none  Consultations:  none  Discharge Exam: BP 143/75 mmHg  Pulse 114  Temp(Src) 98.4 F (36.9 C) (Oral)  Resp 16  Ht 5\' 4"  (1.626 m)  Wt 72.576  kg (160 lb)  BMI 27.45 kg/m2  SpO2 98%  General: anxious Cardiovascular: RRR Respiratory: decreased on the left side, left sided pleural catheter.  Discharge Instructions You were cared for by a hospitalist during your hospital stay. If you have any questions about your discharge medications or the care you received while you were in the hospital after you are discharged, you can call the unit and asked to speak with the hospitalist on call if the hospitalist that took care of you is not available. Once you are discharged, your primary care physician will handle any further medical issues. Please note that NO REFILLS for any discharge medications will be authorized once you are discharged, as it is imperative that you return to your primary care physician (or establish a relationship with a primary care physician if you do not have one) for your aftercare needs so that they can reassess your need for medications and monitor your lab values.  Discharge Instructions    Diet - low sodium heart healthy    Complete by:  As directed      Increase activity slowly    Complete by:  As directed             Medication List    STOP taking these medications        exemestane 25 MG tablet  Commonly known as:  AROMASIN     lidocaine 5 %  Commonly known as:  LIDODERM     naproxen 375 MG tablet  Commonly known as:  NAPROSYN     prochlorperazine 5 MG tablet  Commonly known as:  COMPAZINE      TAKE these medications        acetaminophen 325 MG tablet  Commonly known as:  TYLENOL  Take 2 tablets (650 mg total) by mouth every 12 (twelve) hours as needed for mild pain (or Fever >/= 101).     ALPRAZolam 0.5 MG tablet  Commonly known as:  XANAX  Take 1 tablet (0.5 mg total) by mouth 2 (two) times daily as needed for anxiety.     b complex vitamins capsule  Take 1 capsule by mouth daily.     cholecalciferol 1000 UNITS tablet  Commonly known as:  VITAMIN D  Take 1,000 Units by mouth daily.  Takes 2,000 Iu daily     CITRACAL CALCIUM+D PO  Take by mouth. 400mg  vitamin C, 500mg  vitamin D3 2 PO DAILY     folic acid 1 MG tablet  Commonly known as:  FOLVITE  Take 1 mg by mouth daily.     ibuprofen 200 MG tablet  Commonly known as:  ADVIL,MOTRIN  Take 400 mg by mouth every 4 (four) hours as needed for moderate pain.     MAGNESIUM PO  Take 0.5 tablets by mouth daily as needed (constipation).     Melatonin 1 MG Tabs  Take 3 mg by mouth at bedtime as needed (sleep).     methocarbamol 500 MG tablet  Commonly known as:  ROBAXIN  Take 1 tablet (500 mg total) by mouth every 8 (eight) hours as needed for muscle  spasms.     ondansetron 4 MG tablet  Commonly known as:  ZOFRAN  Take 1 tablet (4 mg total) by mouth every 6 (six) hours as needed for nausea.     oxyCODONE 5 MG immediate release tablet  Commonly known as:  Oxy IR/ROXICODONE  Take 1 tablet (5 mg total) by mouth every 4 (four) hours as needed for severe pain.       Allergies  Allergen Reactions  . Decadron [Dexamethasone] Other (See Comments)    Patient does not tolerate steroids.   . Dilaudid [Hydromorphone] Nausea And Vomiting  . Enoxaparin Other (See Comments)    unknown  . Fluconazole Swelling  . Hydromorphone Hcl Nausea And Vomiting  . Morphine And Related   . Tegaderm Ag Mesh [Silver]       The results of significant diagnostics from this hospitalization (including imaging, microbiology, ancillary and laboratory) are listed below for reference.    Significant Diagnostic Studies: Dg Chest 2 View  11/13/2014   CLINICAL DATA:  Follow-up of pleural effusion  EXAM: CHEST  2 VIEW  COMPARISON:  PA and lateral chest x-ray of November 08, 2014  FINDINGS: The right lung is adequately inflated and clear. On the left there is a persistent moderate-sized pleural effusion. The left-sided chest tube is unchanged in position with its tip projecting over the posterior aspect of the fifth rib. There is patchy alveolar  opacity in the left upper lobe which is stable. The left hemidiaphragm is elevated and obscured. There is curvature of the thoracolumbar spine and S-shaped configuration. There is a nonspecific gas pattern in the upper abdomen.  IMPRESSION: Stable appearance of the moderate-sized left pleural effusion and patchy alveolar opacity in the left upper lobe. The left chest tube is unchanged in position with its tip above the pleural effusion at the level of the posterior fifth rib.   Electronically Signed   By: David  Martinique M.D.   On: 11/13/2014 11:47   Dg Chest 2 View  11/08/2014   CLINICAL DATA:  History of breast cancer and left pleural catheter. Evaluate for a left pleural effusion. Decreased fluid drainage but fullness and pain in the left lower chest.  EXAM: CHEST  2 VIEW  COMPARISON:  10/05/2014 and 10/30/2014  FINDINGS: Stable position of the left pleural catheter. There are densities in the left upper lobe that correspond with the nodular disease seen on the PET-CT from 10/30/2014. There continues to be a pleural collection at the left lung base which has minimally changed in size. Right lung remains clear. Surgical clips in the right axilla. Heart and mediastinum are stable. Negative for a pneumothorax.  IMPRESSION: Persistent parenchymal densities in the left upper lobe which were present on the recent PET-CT. These densities are nonspecific and may be better characterized with a follow-up chest CT.  Stable position of the left chest tube. There is persistent loculated fluid at the left lung base which has minimally changed.   Electronically Signed   By: Markus Daft M.D.   On: 11/08/2014 09:47   Mr Jeri Cos [facility] Contrast  10/18/2014   CLINICAL DATA:  History of right upper inner quadrant breast cancer. Prior seizure in 06/2008. Evaluation for metastatic disease.  EXAM: MRI HEAD WITHOUT AND WITH CONTRAST  TECHNIQUE: Multiplanar, multiecho pulse sequences of the brain and surrounding structures were obtained  without and with intravenous contrast.  CONTRAST:  2mL MULTIHANCE GADOBENATE DIMEGLUMINE 529 MG/ML IV SOLN  COMPARISON:  Prior outside brain MRI 05/20/2010 from  [facility]  FINDINGS: Dedicated thin section imaging through the temporal lobes demonstrates normal volume and signal of the hippocampi. There is no evidence of acute infarct, intracranial hemorrhage, intra-axial mass, midline shift, or extra-axial fluid collection. There is mild generalized cerebral atrophy, unchanged. No significant cerebral white matter disease is identified. No enhancing parenchymal brain lesions are identified.  There is a new 2 cm enhancing lesion in the left parietal bone which extends through the inner table to the dura, which appears mildly thickened. Orbits are unremarkable. No significant inflammatory disease is seen in the paranasal sinuses or mastoid air cells. Major intracranial vascular flow voids are preserved.  IMPRESSION: 1. No evidence of brain metastases. 2. 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   Electronically Signed   By: Logan Bores   On: 10/18/2014 16:12   Nm Pet Image Restag (ps) Skull Base To Thigh  10/30/2014   CLINICAL DATA:  Subsequent treatment strategy for breast cancer.  EXAM: NUCLEAR MEDICINE PET SKULL BASE TO THIGH  TECHNIQUE: 8.0 mCi F-18 FDG was injected intravenously. Full-ring PET imaging was performed from the skull base to thigh after the radiotracer. CT data was obtained and used for attenuation correction and anatomic localization.  FASTING BLOOD GLUCOSE:  Value: 88 mg/dl  COMPARISON:  03/29/2014  FINDINGS: NECK  No hypermetabolic lymph nodes in the neck.  CHEST  Left axillary node measures 0.9 cm and has an SUV max equal to 11.05. New from previous exam. Resolution of previous hyper metabolism associated with the sub- carinal lymph node. There is a new hypermetabolic left infrahilar lymph node which has an SUV max equal to 6.8.   Left-sided chest tube is in place. The loculated left pleural effusion is stable to decreased in volume from previous exam. Again noted is extensive hypermetabolic trans pleural spread of tumor involving the left hemi thorax. Anterior paramediastinal pleural tumor measures 5 x 1.6 cm and has an SUV max equal to 4.28. On the previous exam this measured 7.3 x 3.3 cm and had an SUV max equal to 14.28.Tumor invading the ventral chest wall appears decreased from previous exam. On today's study this measures approximately 1.3 cm in thickness and has an SUV max equal to 6.24. On the previous exam this measure 2.1 cm in thickness and had an SUV max equal to 14.28. Bulky pleural tumor at the posterior medial left base is again noted involving the T11 neuro foramina and extending into the last lateral recess of the canal it this level. On today's study this tumor measures approximately 5.9 cm and has an SUV max equal to 10.5. Previously this measured approximately 5.4 cm and had an SUV max equal to 12.9.  Patchy areas of consolidation and ground-glass attenuation are identified within the left lung and appear new from previous exam. These are nonspecific and may reflect changes of external beam radiation. Pulmonary parenchymal metastasis not excluded. Index nodule within the left upper lobe measures 2.1 cm and has an SUV max equal to 3.7. Within the superior segment of the right lower lobe there is a new parenchymal nodule measuring 11 mm within SUV max equal to 2.9.  ABDOMEN/PELVIS  No abnormal hypermetabolic activity within the liver, pancreas, adrenal glands, or spleen. 1.6 cm periaortic lymph node is identified within SUV max equal to 11.6. Previously this lymph node measured 1.4 cm and had an SUV max equal 7.5. Adjacent lymph node measures 1.1 cm and has an SUV  max of 8.4. Previously this mesh 0.9 cm and had an SUV max equal to 9.28. More caudal cluster of periaortic lymph nodes measures 2.5 cm and has an SUV max equal  to 6.3 cm. Previously 1.9 cm within SUV max equal to 6.3. No enlarged or hypermetabolic pelvic or inguinal lymph nodes.  SKELETON  No focal hypermetabolic activity to suggest skeletal metastasis.  IMPRESSION: 1. Interval mixed response to therapy. Specifically, the bulky pleural based tumor along the paramediastinal left upper lobe extending into the ventral chest wall has decreased in size and degree of FDG uptake. Additionally, there is been resolution of previous hyper metabolism associated with the sub- carinal adenopathy. Persistent pleural based tumor overlying the posterior and medial left lung base is again noted. There is continued extension into the T11 neuro foramina and lateral recess of the canal. 2. New hypermetabolic left axillary and left hilar adenopathy. Additionally, persistent hypermetabolic adenopathy within the upper abdomen demonstrates mild increase in size in the interval. 3. Bilateral pulmonary nodular opacities are identified and are nonspecific. These exhibit malignant range FDG uptake and are new from previous exam. Findings may reflect nonspecific pneumonitis and may also be seen with changes of external beam radiation. Pulmonary metastasis not excluded.   Electronically Signed   By: Kerby Moors M.D.   On: 10/30/2014 16:12   [facility] Radiologist Eval & Mgmt  11/14/2014   CLINICAL DATA:  Referring physician: Lurline Del  Reason for consultation: Left PleurX  Past medical history: Pleural effusionBreast cancer of upper-inner quadrant of right female breast, Secondary malignant neoplasm of mediastinal lymph node, Abnormal LFTs (liver function tests), Chest wall pain, Post-lymphadenectomy lymphedema of arm  HISTORY OF PRESENT ILLNESS: Metastatic breast carcinoma. Large malignant left pleural effusion. PleurX catheter placed 09/01/2013 by Dr. Pascal Lux. Decreasing output since July. PET-CT 10/30/2014 demonstrated residual loculated subpulmonic effusion with extensive associated pleural  metastatic disease. This has remained grossly stable on subsequent chest x-rays through 11/13/2014. The remainder of the fairly extensive effusion seen preprocedure has resolved. There is involvement of a left lower thoracic neural foramen from extension of pleural disease. She is not having any problems at the skin entry site of the PleurX catheter.  PROCEDURE: We connected the catheter up for drainage but could only get about 10 cc.  ASSESSMENT AND PLAN: My impression is that she has had extensive pleurodesis secondary to the presence of the PleurX catheter with resolution of much of the previously noted malignant left pleural effusion. The residual loculated subpulmonic fluid is associated with a moderate amount of adjacent pleural metastatic disease and and not amenable to repositioning of the catheter. In fact, this may not even significantly decompress with thoracentesis if it is loculated or inaccessible due to overlying tumor. I discussed the findings with the patient and her spouse.  My recommendation is that we remove the catheter. She is interested in trying thoracentesis, to aspirate the residual subpulmonic effusion at the same time, in hopes of some symptomatic relief, and I think it would be a reasonable to look with ultrasound to see if this is feasible. We can schedule this next week with Dr. Pascal Lux as she has requested.   Electronically Signed   By: Lucrezia Europe M.D.   On: 11/14/2014 16:21    Microbiology: No results found for this or any previous visit (from the past 240 hour(s)).   Labs: Basic Metabolic Panel:  Recent Labs Lab 11/14/14 1328 11/15/14 0524  NA 136 141  K 4.4 4.7  CL  --  104  CO2 25 30  GLUCOSE 113 104*  BUN 10.0 13  CREATININE 0.7 0.88  CALCIUM 10.1 9.3   Liver Function Tests:  Recent Labs Lab 11/14/14 1328  AST 33  ALT 48  ALKPHOS 115  BILITOT 0.31  PROT 7.6  ALBUMIN 3.7   No results for input(s): LIPASE, AMYLASE in the last 168 hours. No results  for input(s): AMMONIA in the last 168 hours. CBC:  Recent Labs Lab 11/14/14 1328 11/15/14 0524  WBC 8.2 8.1  NEUTROABS 6.6* 4.7  HGB 13.0 16.8*  HCT 38.0 47.2*  MCV 93.2 94.2  PLT 510* 202   Cardiac Enzymes: No results for input(s): CKTOTAL, CKMB, CKMBINDEX, TROPONINI in the last 168 hours. BNP: BNP (last 3 results) No results for input(s): BNP in the last 8760 hours.  ProBNP (last 3 results) No results for input(s): PROBNP in the last 8760 hours.  CBG: No results for input(s): GLUCAP in the last 168 hours.     SignedFlorencia Reasons MD, PhD  [facility] 11/15/2014, 1:21 PM

## 2014-11-15 NOTE — Progress Notes (Signed)
Nursing Discharge Summary  Patient ID: [name] [name] MRN: [id number] DOB/AGE: [id number] 55 y.o.  Admit date: 11/15/2014 Discharge date: 11/15/2014  Discharged Condition: good  Disposition: 01-Home or Self Care    Prescriptions Given: Patient medications called into pharmacy. Medications and follow up appointments discussed and both patient and wife verbalized understanding.  Means of Discharge: patient to be taken downstairs via wheelchair to be discharged home.   Signed: Buel Ream 11/15/2014, 1:53 PM

## 2014-11-15 NOTE — Telephone Encounter (Signed)
Call rcvd from [facility] on [facility] [address].  Pt is requesting that Dr. Jana Hakim come by her room to see her.  Message given to Dr. Jana Hakim.

## 2014-11-15 NOTE — ED Notes (Signed)
Bed: WTR5 Expected date:  Expected time:  Means of arrival:  Comments: 

## 2014-11-15 NOTE — Telephone Encounter (Signed)
Call report rcvd from [facility].  Sent to scan.

## 2014-11-15 NOTE — ED Provider Notes (Signed)
CSN: [id number]     Arrival date & time 11/15/14  9024 History   First MD Initiated Contact with Patient 11/15/14 [phone]     Chief Complaint  Patient presents with  . Back Pain     (Consider location/radiation/quality/duration/timing/severity/associated sxs/prior Treatment) HPI Comments: 54 year old female with history of metastatic breast cancer to bone s/p radiation therapy, seizure, presenting for management of worsening cancer related pain. Patient reports since August 2 she has been having worsening mid to low back pain. She had a PET scan that show evidence of metastatic cancer to T11. Her pain is related to her cancer. She reports worsening pain over the past week. Pain is located at her waist on the left side. She also had pain in her buttocks earlier today which has subsided; she attributes the pain in her buttocks to be from a Lidocaine patch which she used today. She has never used a Lidocaine patch before. Patient also has been taking Percocet, but she does not tolerate this well due to nausea and vomiting. Patient does NOT tolerate Dilaudid, Vicodin, or Morphine well because of extreme nausea. She denies any fever PTA. No bowel/bladder incontinence or extremity numbness/weakness. She was offered admission for pain control yesterday, 11/14/14, by Dr. Griffith Citron but she declined this. Motrin last taken at 1715. She also has a Pleurx to help drain pleural effusions from a lung since July of last year. She denies having any worsening symptoms with her Pleurx site aside from decreasing drainage for the past several weeks which her doctor is aware. Her Pleurx is scheduled to be removed for this reason.  Patient is a 54 y.o. female presenting with back pain. The history is provided by the patient and the spouse. No language interpreter was used.  Back Pain Associated symptoms: no fever     Past Medical History  Diagnosis Date  . Seizures 2010    Isolated incident.  Marland Kitchen PONV (postoperative nausea  and vomiting)   . Peripheral vascular disease 02/2010    blood clot related to porta cath  . Breast cancer dx'd 2005/2011  . Bone metastases dx'd 05/2014  . S/P radiation therapy 07/17/2014 through 08/02/2014     Left mediastinum, left seventh rib 3250 cGy in 13 sessions    Past Surgical History  Procedure Laterality Date  . Breast lumpectomy  2005  . Axillary lymph node dissection  Dec. 2011  . Portacath placement  12/11  . Removal portacath    . Mediastinotomy chamberlain mcneil Left 06/02/2013    Procedure: MEDIASTINOTOMY CHAMBERLAIN MCNEIL;  Surgeon: [name], MD;  Location: [facility];  Service: Thoracic;  Laterality: Left;  LEFT ANTERIOR MEDIASTINOTOMY    Family History  Problem Relation Age of Onset  . COPD Mother   . Breast cancer Sister 14   Social History  Substance Use Topics  . Smoking status: Never Smoker   . Smokeless tobacco: Never Used  . Alcohol Use: No   OB History    No data available      Review of Systems  Constitutional: Negative for fever.  Gastrointestinal: Positive for nausea and vomiting.  Genitourinary:       Negative for incontinence  Musculoskeletal: Positive for back pain.  All other systems reviewed and are negative.   Allergies  Decadron; Dilaudid; Enoxaparin; Fluconazole; Hydromorphone hcl; Morphine and related; and Tegaderm ag mesh  Home Medications   Prior to Admission medications   Medication Sig Start Date End Date Taking? Authorizing Provider  ALPRAZolam Duanne Moron) 0.5  MG tablet Take 1 tablet (0.5 mg total) by mouth 2 (two) times daily as needed for anxiety. Patient taking differently: Take 0.125 mg by mouth 2 (two) times daily as needed for anxiety.  10/12/14   Chauncey Cruel, MD  b complex vitamins capsule Take 1 capsule by mouth daily.    Historical Provider, MD  Calcium-Magnesium-Vitamin D (CITRACAL CALCIUM+D PO) Take by mouth. 400mg  vitamin  C, 500mg  vitamin D3 2 PO DAILY    Historical Provider, MD  cholecalciferol (VITAMIN D) 1000 UNITS tablet Take 1,000 Units by mouth daily. Takes 2,000 Iu daily 06/30/14   Chauncey Cruel, MD  exemestane (AROMASIN) 25 MG tablet Take 25 mg by mouth daily after breakfast.  03/30/14   Historical Provider, MD  folic acid (FOLVITE) 1 MG tablet Take 1 mg by mouth daily.    Historical Provider, MD  ibuprofen (ADVIL,MOTRIN) 200 MG tablet Take 400 mg by mouth every 4 (four) hours as needed for moderate pain.     Historical Provider, MD  lidocaine (LIDODERM) 5 % Place 1 patch onto the skin daily. Remove & Discard patch within 12 hours or as directed by MD 11/14/14   Chauncey Cruel, MD  MAGNESIUM PO Take 0.5 tablets by mouth daily as needed (constipation).    Historical Provider, MD  Melatonin 1 MG TABS Take 3 mg by mouth at bedtime as needed (sleep).     Historical Provider, MD  methocarbamol (ROBAXIN) 500 MG tablet Take 1 tablet (500 mg total) by mouth every 8 (eight) hours as needed for muscle spasms. 11/11/14   Domenic Moras, PA-C  naproxen (NAPROSYN) 375 MG tablet Take 1 tablet (375 mg total) by mouth 2 (two) times daily. 11/11/14   Domenic Moras, PA-C  oxyCODONE (OXY IR/ROXICODONE) 5 MG immediate release tablet Take 1 tablet (5 mg total) by mouth every 4 (four) hours as needed for severe pain. Patient taking differently: Take 1.25 mg by mouth every 4 (four) hours as needed for severe pain.  08/07/14   [name], MD  prochlorperazine (COMPAZINE) 5 MG tablet Take 1 tablet (5 mg total) by mouth every 6 (six) hours as needed for nausea or vomiting. 11/14/14   Chauncey Cruel, MD   BP 152/97 mmHg  Pulse 115  Temp(Src) 98.7 F (37.1 C) (Oral)  Resp 24  Wt 160 lb (72.576 kg)  SpO2 96%   Physical Exam  Constitutional: She is oriented to person, place, and time. She appears well-developed and well-nourished. No distress.  Patient very mobile in the bed secondary to discomfort  HENT:  Head: Normocephalic and  atraumatic.  Eyes: Conjunctivae and EOM are normal. No scleral icterus.  Neck: Normal range of motion.  Pulmonary/Chest: Effort normal. No respiratory distress. She has no wheezes.  Respirations even and unlabored  Abdominal:  Bandage to abdomen is C/D/I  Musculoskeletal: Normal range of motion. She exhibits tenderness.  L lumbar paraspinal muscle TTP. No bony deformities, step offs, or crepitus.  Neurological: She is alert and oriented to person, place, and time. She exhibits normal muscle tone. Coordination normal.  GCS 15. Patient moving all extremities.  Skin: Skin is warm and dry. No rash noted. She is not diaphoretic. No erythema. No pallor.  Psychiatric: She has a normal mood and affect. Her behavior is normal.  Nursing note and vitals reviewed.   ED Course  Procedures (including critical care time) Labs Review Labs Reviewed  CBC WITH DIFFERENTIAL/PLATELET  BASIC METABOLIC PANEL    Imaging Review Dg Chest  2 View  11/13/2014   CLINICAL DATA:  Follow-up of pleural effusion  EXAM: CHEST  2 VIEW  COMPARISON:  PA and lateral chest x-ray of November 08, 2014  FINDINGS: The right lung is adequately inflated and clear. On the left there is a persistent moderate-sized pleural effusion. The left-sided chest tube is unchanged in position with its tip projecting over the posterior aspect of the fifth rib. There is patchy alveolar opacity in the left upper lobe which is stable. The left hemidiaphragm is elevated and obscured. There is curvature of the thoracolumbar spine and S-shaped configuration. There is a nonspecific gas pattern in the upper abdomen.  IMPRESSION: Stable appearance of the moderate-sized left pleural effusion and patchy alveolar opacity in the left upper lobe. The left chest tube is unchanged in position with its tip above the pleural effusion at the level of the posterior fifth rib.   Electronically Signed   By: David  Martinique M.D.   On: 11/13/2014 11:47   [facility] Radiologist Eval &  Mgmt  11/14/2014   CLINICAL DATA:  Referring physician: Lurline Del  Reason for consultation: Left PleurX  Past medical history: Pleural effusionBreast cancer of upper-inner quadrant of right female breast, Secondary malignant neoplasm of mediastinal lymph node, Abnormal LFTs (liver function tests), Chest wall pain, Post-lymphadenectomy lymphedema of arm  HISTORY OF PRESENT ILLNESS: Metastatic breast carcinoma. Large malignant left pleural effusion. PleurX catheter placed 09/01/2013 by Dr. Pascal Lux. Decreasing output since July. PET-CT 10/30/2014 demonstrated residual loculated subpulmonic effusion with extensive associated pleural metastatic disease. This has remained grossly stable on subsequent chest x-rays through 11/13/2014. The remainder of the fairly extensive effusion seen preprocedure has resolved. There is involvement of a left lower thoracic neural foramen from extension of pleural disease. She is not having any problems at the skin entry site of the PleurX catheter.  PROCEDURE: We connected the catheter up for drainage but could only get about 10 cc.  ASSESSMENT AND PLAN: My impression is that she has had extensive pleurodesis secondary to the presence of the PleurX catheter with resolution of much of the previously noted malignant left pleural effusion. The residual loculated subpulmonic fluid is associated with a moderate amount of adjacent pleural metastatic disease and and not amenable to repositioning of the catheter. In fact, this may not even significantly decompress with thoracentesis if it is loculated or inaccessible due to overlying tumor. I discussed the findings with the patient and her spouse.  My recommendation is that we remove the catheter. She is interested in trying thoracentesis, to aspirate the residual subpulmonic effusion at the same time, in hopes of some symptomatic relief, and I think it would be a reasonable to look with ultrasound to see if this is feasible. We can schedule  this next week with Dr. Pascal Lux as she has requested.   Electronically Signed   By: Lucrezia Europe M.D.   On: 11/14/2014 16:21   I have personally reviewed and evaluated these images and lab results as part of my medical decision-making.   EKG Interpretation None      MDM   Final diagnoses:  Left-sided low back pain without sciatica  Uncontrolled pain  Cancer related pain    54 year old female presents to the emergency department for persistent low back pain secondary to metastatic cancer to T11. Patient complaining of persistent nausea/vomiting secondary to use of a lidocaine patch and Percocet. Patient is very sensitive to narcotic pain medication as it causes severe nausea with vomiting. Zofran given in  the emergency department for this. Patient also ordered for a dose of Toradol for pain control. She had fairly good response to Toradol when seen in the ED 4 days ago.  Patient neurovascularly intact today. Given that this is her third visit for worsening back pain with no consistent control of her pain, admission advised for pain control. Patient agreeable to admission tonight; she was resistant during her prior ED visit as well as when seeing her oncologist, Dr. Griffith Citron, yesterday afternoon. CBC and BMP ordered. Case discussed also with Dr. Posey Pronto of [facility] who will admit the patient for further symptom management.   Filed Vitals:   11/15/14 0319  BP: 152/97  Pulse: 115  Temp: 98.7 F (37.1 C)  TempSrc: Oral  Resp: 24  Weight: 160 lb (72.576 kg)  SpO2: 96%     Antonietta Breach, PA-C 11/15/14 University, MD 11/15/14 [phone]

## 2014-11-15 NOTE — ED Notes (Addendum)
Pt from home with her husband states she has cancer of the spine around T11. Pt states she has had uncontrollable pain for approximately 2 weeks. Pt says they have been altering her pain management schedule because she is "incredibaly sensitive to medication and less is more with her". Pt States she has a lidocaine pain patch that was placed around 5pm and initially the pain was lower than the patch. Her nausea increased and she vomited 5 times today. The more she vomited, the less her back hurt, she reports. Pt states her pain also moved up to where the pain patch is.

## 2014-11-15 NOTE — H&P (Signed)
[facility] History and Physical  Patient: [name] [name]  MRN: [id number]  DOB: 1961-01-08  DOS: the patient was seen and examined on 11/15/2014 PCP: Pcp Not In System  Referring physician: Dr. Beverely Low Chief Complaint: Back pain  HPI: [name] [name] is a 54 y.o. female with Past medical history of breast cancer stage IV, pleural effusion, T11 metastasis, Pleurx catheter. The patient is presenting with complains of back pain located around her middle back. This pain has been ongoing for last 2 days. She was seen in the ER on 11/11/2014 with this pain. The pain was described as worsening with sitting and improved with standing. She was prescribed Robaxin and was recommended to take naproxen and she took it over the weekends but she had an episode of vomiting and after that she did not take any of his medications. She tried to take one quarter of oxycodone every 3-4 hours for pain management along with Motrin. And after further dose of oxycodone she was started having vomiting and cannot tolerate any more oxycodone. The patient is significantly anxious and is shaking her hands on the right side and anxiety. She denies any loss of control of bowel or bladder any numbness or weakness in her legs or any radiation of the pain in her legs. She was prescribed lidocaine patch by her oncologist but patient mentions after wearing the patch, she had worsening pain where she was wearing the patch. She never had any pain on the site of the pain patch application before the application.  The patient is coming from home.  At her baseline ambulates without any support And is independent for most of her ADL manages her medication on her own.  Review of Systems: as mentioned in the history of present illness.  A comprehensive review of the other systems is negative.  Past Medical History  Diagnosis Date  . Seizures 2010    Isolated incident.  Marland Kitchen PONV (postoperative nausea and vomiting)   .  Peripheral vascular disease 02/2010    blood clot related to porta cath  . Breast cancer dx'd 2005/2011  . Bone metastases dx'd 05/2014  . S/P radiation therapy 07/17/2014 through 08/02/2014     Left mediastinum, left seventh rib 3250 cGy in 13 sessions    Past Surgical History  Procedure Laterality Date  . Breast lumpectomy  2005  . Axillary lymph node dissection  Dec. 2011  . Portacath placement  12/11  . Removal portacath    . Mediastinotomy chamberlain mcneil Left 06/02/2013    Procedure: MEDIASTINOTOMY CHAMBERLAIN MCNEIL;  Surgeon: [name], MD;  Location: [facility];  Service: Thoracic;  Laterality: Left;  LEFT ANTERIOR MEDIASTINOTOMY    Social History:  reports that she has never smoked. She has never used smokeless tobacco. She reports that she does not drink alcohol or use illicit drugs.  Allergies  Allergen Reactions  . Decadron [Dexamethasone] Other (See Comments)    Patient does not tolerate steroids.   . Dilaudid [Hydromorphone] Nausea And Vomiting  . Enoxaparin Other (See Comments)    unknown  . Fluconazole Swelling  . Hydromorphone Hcl Nausea And Vomiting  . Morphine And Related   . Tegaderm Ag Mesh [Silver]     Family History  Problem Relation Age of Onset  . COPD Mother   . Breast cancer Sister 44    Prior to Admission medications   Medication Sig Start Date End Date Taking? Authorizing Provider  ALPRAZolam Duanne Moron) 0.5 MG tablet Take 1 tablet (  0.5 mg total) by mouth 2 (two) times daily as needed for anxiety. Patient taking differently: Take 0.125 mg by mouth 2 (two) times daily as needed for anxiety.  10/12/14  Yes Chauncey Cruel, MD  b complex vitamins capsule Take 1 capsule by mouth daily.   Yes Historical Provider, MD  Calcium-Magnesium-Vitamin D (CITRACAL CALCIUM+D PO) Take by mouth. 400mg  vitamin C, 500mg  vitamin D3 2 PO DAILY   Yes Historical Provider, MD    cholecalciferol (VITAMIN D) 1000 UNITS tablet Take 1,000 Units by mouth daily. Takes 2,000 Iu daily 06/30/14  Yes Chauncey Cruel, MD  folic acid (FOLVITE) 1 MG tablet Take 1 mg by mouth daily.   Yes Historical Provider, MD  ibuprofen (ADVIL,MOTRIN) 200 MG tablet Take 400 mg by mouth every 4 (four) hours as needed for moderate pain.    Yes Historical Provider, MD  lidocaine (LIDODERM) 5 % Place 1 patch onto the skin daily. Remove & Discard patch within 12 hours or as directed by MD 11/14/14  Yes Chauncey Cruel, MD  MAGNESIUM PO Take 0.5 tablets by mouth daily as needed (constipation).   Yes Historical Provider, MD  Melatonin 1 MG TABS Take 3 mg by mouth at bedtime as needed (sleep).    Yes Historical Provider, MD  oxyCODONE (OXY IR/ROXICODONE) 5 MG immediate release tablet Take 1 tablet (5 mg total) by mouth every 4 (four) hours as needed for severe pain. Patient taking differently: Take 1.25 mg by mouth every 4 (four) hours as needed for severe pain.  08/07/14  Yes [name], MD  prochlorperazine (COMPAZINE) 5 MG tablet Take 1 tablet (5 mg total) by mouth every 6 (six) hours as needed for nausea or vomiting. 11/14/14  Yes Chauncey Cruel, MD  exemestane (AROMASIN) 25 MG tablet Take 25 mg by mouth daily after breakfast.  03/30/14   Historical Provider, MD  methocarbamol (ROBAXIN) 500 MG tablet Take 1 tablet (500 mg total) by mouth every 8 (eight) hours as needed for muscle spasms. 11/11/14   Domenic Moras, PA-C  naproxen (NAPROSYN) 375 MG tablet Take 1 tablet (375 mg total) by mouth 2 (two) times daily. 11/11/14   Domenic Moras, PA-C    Physical Exam: Filed Vitals:   11/15/14 0319 11/15/14 0615  BP: 152/97 146/92  Pulse: 115 113  Temp: 98.7 F (37.1 C) 98.1 F (36.7 C)  TempSrc: Oral Oral  Resp: 24 18  Weight: 72.576 kg (160 lb)   SpO2: 96% 94%    General: Alert, Awake and Oriented to Time, Place and Person. Appear in marked distress Appears significantly anxious Eyes: PERRL ENT: Oral  Mucosa clear moist. Neck: no JVD Cardiovascular: S1 and S2 Present, no Murmur, Peripheral Pulses Present Respiratory: Bilateral Air entry equal and Decreased,  Clear to Auscultation, no Crackles, no wheezes Abdomen: Bowel Sound present, Soft and non tenderness Skin: no Rash Extremities: no Pedal edema, no calf tenderness Neurologic: Grossly no focal neuro deficit.  Labs on Admission:  CBC:  Recent Labs Lab 11/14/14 1328 11/15/14 0524  WBC 8.2 8.1  NEUTROABS 6.6* 4.7  HGB 13.0 16.8*  HCT 38.0 47.2*  MCV 93.2 94.2  PLT 510* 202    CMP     Component Value Date/Time   NA 141 11/15/2014 0524   NA 136 11/14/2014 1328   K 4.7 11/15/2014 0524   K 4.4 11/14/2014 1328   CL 104 11/15/2014 0524   CL 105 05/06/2012 1333   CO2 30 11/15/2014 0524   CO2  25 11/14/2014 1328   GLUCOSE 104* 11/15/2014 0524   GLUCOSE 113 11/14/2014 1328   GLUCOSE 124* 05/06/2012 1333   BUN 13 11/15/2014 0524   BUN 10.0 11/14/2014 1328   CREATININE 0.88 11/15/2014 0524   CREATININE 0.7 11/14/2014 1328   CALCIUM 9.3 11/15/2014 0524   CALCIUM 10.1 11/14/2014 1328   PROT 7.6 11/14/2014 1328   PROT 6.8 09/03/2013 2145   ALBUMIN 3.7 11/14/2014 1328   ALBUMIN 3.3* 09/03/2013 2145   AST 33 11/14/2014 1328   AST 55* 09/03/2013 2145   ALT 48 11/14/2014 1328   ALT 85* 09/03/2013 2145   ALKPHOS 115 11/14/2014 1328   ALKPHOS 114 09/03/2013 2145   BILITOT 0.31 11/14/2014 1328   BILITOT 0.3 09/03/2013 2145   GFRNONAA >60 11/15/2014 0524   GFRAA >60 11/15/2014 0524    No results for input(s): LIPASE, AMYLASE in the last 168 hours.  No results for input(s): CKTOTAL, CKMB, CKMBINDEX, TROPONINI in the last 168 hours. BNP (last 3 results) No results for input(s): BNP in the last 8760 hours.  ProBNP (last 3 results) No results for input(s): PROBNP in the last 8760 hours.   Radiological Exams on Admission: Dg Chest 2 View  11/13/2014   CLINICAL DATA:  Follow-up of pleural effusion  EXAM: CHEST  2 VIEW   COMPARISON:  PA and lateral chest x-ray of November 08, 2014  FINDINGS: The right lung is adequately inflated and clear. On the left there is a persistent moderate-sized pleural effusion. The left-sided chest tube is unchanged in position with its tip projecting over the posterior aspect of the fifth rib. There is patchy alveolar opacity in the left upper lobe which is stable. The left hemidiaphragm is elevated and obscured. There is curvature of the thoracolumbar spine and S-shaped configuration. There is a nonspecific gas pattern in the upper abdomen.  IMPRESSION: Stable appearance of the moderate-sized left pleural effusion and patchy alveolar opacity in the left upper lobe. The left chest tube is unchanged in position with its tip above the pleural effusion at the level of the posterior fifth rib.   Electronically Signed   By: David  Martinique M.D.   On: 11/13/2014 11:47   [facility] Radiologist Eval & Mgmt  11/14/2014   CLINICAL DATA:  Referring physician: Lurline Del  Reason for consultation: Left PleurX  Past medical history: Pleural effusionBreast cancer of upper-inner quadrant of right female breast, Secondary malignant neoplasm of mediastinal lymph node, Abnormal LFTs (liver function tests), Chest wall pain, Post-lymphadenectomy lymphedema of arm  HISTORY OF PRESENT ILLNESS: Metastatic breast carcinoma. Large malignant left pleural effusion. PleurX catheter placed 09/01/2013 by Dr. Pascal Lux. Decreasing output since July. PET-CT 10/30/2014 demonstrated residual loculated subpulmonic effusion with extensive associated pleural metastatic disease. This has remained grossly stable on subsequent chest x-rays through 11/13/2014. The remainder of the fairly extensive effusion seen preprocedure has resolved. There is involvement of a left lower thoracic neural foramen from extension of pleural disease. She is not having any problems at the skin entry site of the PleurX catheter.  PROCEDURE: We connected the catheter up for  drainage but could only get about 10 cc.  ASSESSMENT AND PLAN: My impression is that she has had extensive pleurodesis secondary to the presence of the PleurX catheter with resolution of much of the previously noted malignant left pleural effusion. The residual loculated subpulmonic fluid is associated with a moderate amount of adjacent pleural metastatic disease and and not amenable to repositioning of the catheter. In fact, this  may not even significantly decompress with thoracentesis if it is loculated or inaccessible due to overlying tumor. I discussed the findings with the patient and her spouse.  My recommendation is that we remove the catheter. She is interested in trying thoracentesis, to aspirate the residual subpulmonic effusion at the same time, in hopes of some symptomatic relief, and I think it would be a reasonable to look with ultrasound to see if this is feasible. We can schedule this next week with Dr. Pascal Lux as she has requested.   Electronically Signed   By: Lucrezia Europe M.D.   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 Back pain The patient is presenting with complaints of uncontrollable back pain. She constantly mention of this is related to her T11 metastasis. She was recently diagnosed with her T11 metastasis. She is also significantly anxious. Her pain is also radiating to gluteal region on the left side. She does not have any radicular symptoms. I suspect her pain is more related to muscle spasm. She was recommended to use a muscle relaxant but she refused to take it. She was also recommended to take Valium for antianxiety which she has also refused. At present she has only agreed to use Toradol as needed with oxycodone and ice application in between as needed. Patient is scheduled for  outpatient follow-up with radiation oncology to discuss about chest wall pain management, but she may also need discussion regarding back pain management, since she is refusing available pain management option due to intolerance.  2. Pleural effusion. Patient has a pleural catheter which is scheduled to be removed next week. Continue close monitoring.  3. Breast cancer. The patient was on exemestane, and was scheduled to be changed to fulvestrant, which is currently on hold. Continue close monitoring.   4.Anxiety. The patient is currently refusing use any anxiety medication. We'll continue close monitoring.  Advance goals of care discussion: Full code   DVT Prophylaxis: subcutaneous Heparin Nutrition: Regular diet  Family Communication: family was present at bedside, opportunity was given to ask question and all questions were answered satisfactorily at the time of interview. Disposition: Admitted as observation, med-surge unit.  Author: Berle Mull, MD [facility] Pager: [phone] 11/15/2014  If 7PM-7AM, please contact night-coverage [email or web address] Password [email or web address]

## 2014-11-15 NOTE — Telephone Encounter (Signed)
Per Dr. Jana Hakim, asked Rollene Fare to let pt know Dr. Jana Hakim will be up to see her later this afternoon.

## 2014-11-15 NOTE — ED Notes (Signed)
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

## 2014-11-16 ENCOUNTER — Ambulatory Visit: Admission: RE | Admit: 2014-11-16 | Payer: 59 | Source: Ambulatory Visit

## 2014-11-16 ENCOUNTER — Ambulatory Visit
Admit: 2014-11-16 | Discharge: 2014-11-16 | Disposition: A | Discharge status: Home / Self Care | Payer: 59 | Admitting: Radiation Oncology

## 2014-11-16 ENCOUNTER — Encounter: Payer: Self-pay | Admitting: Radiation Oncology

## 2014-11-16 ENCOUNTER — Other Ambulatory Visit (HOSPITAL_COMMUNITY): Payer: 59

## 2014-11-16 ENCOUNTER — Ambulatory Visit: Payer: 59

## 2014-11-16 ENCOUNTER — Inpatient Hospital Stay (HOSPITAL_COMMUNITY)
Admission: AD | Admit: 2014-11-16 | Discharge: 2014-11-22 | DRG: 948 | Disposition: A | Discharge status: Home / Self Care | Payer: 59 | Source: Ambulatory Visit | Attending: Oncology | Admitting: Oncology

## 2014-11-16 ENCOUNTER — Encounter (HOSPITAL_COMMUNITY): Payer: Self-pay

## 2014-11-16 ENCOUNTER — Ambulatory Visit
Admission: RE | Admit: 2014-11-16 | Discharge: 2014-11-16 | Disposition: A | Discharge status: Home / Self Care | Payer: 59 | Source: Ambulatory Visit | Attending: Radiation Oncology | Admitting: Radiation Oncology

## 2014-11-16 ENCOUNTER — Ambulatory Visit (HOSPITAL_COMMUNITY): Payer: 59

## 2014-11-16 DIAGNOSIS — I890 Lymphedema, not elsewhere classified: Secondary | ICD-10-CM

## 2014-11-16 DIAGNOSIS — C50919 Malignant neoplasm of unspecified site of unspecified female breast: Secondary | ICD-10-CM | POA: Diagnosis present

## 2014-11-16 DIAGNOSIS — R0789 Other chest pain: Secondary | ICD-10-CM | POA: Diagnosis present

## 2014-11-16 DIAGNOSIS — K5909 Other constipation: Secondary | ICD-10-CM | POA: Diagnosis present

## 2014-11-16 DIAGNOSIS — G629 Polyneuropathy, unspecified: Secondary | ICD-10-CM | POA: Insufficient documentation

## 2014-11-16 DIAGNOSIS — C7951 Secondary malignant neoplasm of bone: Secondary | ICD-10-CM | POA: Diagnosis not present

## 2014-11-16 DIAGNOSIS — E8989 Other postprocedural endocrine and metabolic complications and disorders: Secondary | ICD-10-CM

## 2014-11-16 DIAGNOSIS — R112 Nausea with vomiting, unspecified: Secondary | ICD-10-CM

## 2014-11-16 DIAGNOSIS — R000 Tachycardia, unspecified: Secondary | ICD-10-CM | POA: Diagnosis not present

## 2014-11-16 DIAGNOSIS — B370 Candidal stomatitis: Secondary | ICD-10-CM | POA: Diagnosis not present

## 2014-11-16 DIAGNOSIS — Z803 Family history of malignant neoplasm of breast: Secondary | ICD-10-CM

## 2014-11-16 DIAGNOSIS — Z510 Encounter for antineoplastic radiation therapy: Secondary | ICD-10-CM | POA: Diagnosis present

## 2014-11-16 DIAGNOSIS — K5900 Constipation, unspecified: Secondary | ICD-10-CM | POA: Diagnosis not present

## 2014-11-16 DIAGNOSIS — M8580 Other specified disorders of bone density and structure, unspecified site: Secondary | ICD-10-CM | POA: Diagnosis present

## 2014-11-16 DIAGNOSIS — C50211 Malignant neoplasm of upper-inner quadrant of right female breast: Secondary | ICD-10-CM | POA: Diagnosis present

## 2014-11-16 DIAGNOSIS — E871 Hypo-osmolality and hyponatremia: Secondary | ICD-10-CM | POA: Diagnosis present

## 2014-11-16 DIAGNOSIS — C801 Malignant (primary) neoplasm, unspecified: Secondary | ICD-10-CM

## 2014-11-16 DIAGNOSIS — T40605A Adverse effect of unspecified narcotics, initial encounter: Secondary | ICD-10-CM | POA: Diagnosis present

## 2014-11-16 DIAGNOSIS — M546 Pain in thoracic spine: Secondary | ICD-10-CM | POA: Insufficient documentation

## 2014-11-16 DIAGNOSIS — G893 Neoplasm related pain (acute) (chronic): Secondary | ICD-10-CM | POA: Diagnosis present

## 2014-11-16 DIAGNOSIS — J910 Malignant pleural effusion: Secondary | ICD-10-CM | POA: Diagnosis present

## 2014-11-16 DIAGNOSIS — C782 Secondary malignant neoplasm of pleura: Secondary | ICD-10-CM | POA: Diagnosis present

## 2014-11-16 DIAGNOSIS — R7989 Other specified abnormal findings of blood chemistry: Secondary | ICD-10-CM | POA: Diagnosis present

## 2014-11-16 DIAGNOSIS — Z888 Allergy status to other drugs, medicaments and biological substances status: Secondary | ICD-10-CM

## 2014-11-16 DIAGNOSIS — R110 Nausea: Secondary | ICD-10-CM | POA: Diagnosis not present

## 2014-11-16 DIAGNOSIS — R591 Generalized enlarged lymph nodes: Secondary | ICD-10-CM | POA: Diagnosis not present

## 2014-11-16 DIAGNOSIS — C771 Secondary and unspecified malignant neoplasm of intrathoracic lymph nodes: Secondary | ICD-10-CM | POA: Diagnosis present

## 2014-11-16 DIAGNOSIS — J90 Pleural effusion, not elsewhere classified: Secondary | ICD-10-CM | POA: Diagnosis present

## 2014-11-16 DIAGNOSIS — K760 Fatty (change of) liver, not elsewhere classified: Secondary | ICD-10-CM | POA: Diagnosis present

## 2014-11-16 DIAGNOSIS — Z885 Allergy status to narcotic agent status: Secondary | ICD-10-CM | POA: Diagnosis not present

## 2014-11-16 DIAGNOSIS — R945 Abnormal results of liver function studies: Secondary | ICD-10-CM | POA: Diagnosis present

## 2014-11-16 DIAGNOSIS — Z86718 Personal history of other venous thrombosis and embolism: Secondary | ICD-10-CM | POA: Diagnosis not present

## 2014-11-16 DIAGNOSIS — C50911 Malignant neoplasm of unspecified site of right female breast: Secondary | ICD-10-CM | POA: Diagnosis not present

## 2014-11-16 DIAGNOSIS — R52 Pain, unspecified: Secondary | ICD-10-CM | POA: Diagnosis present

## 2014-11-16 DIAGNOSIS — Z170 Estrogen receptor positive status [ER+]: Secondary | ICD-10-CM | POA: Diagnosis not present

## 2014-11-16 DIAGNOSIS — M549 Dorsalgia, unspecified: Secondary | ICD-10-CM | POA: Insufficient documentation

## 2014-11-16 LAB — COMPREHENSIVE METABOLIC PANEL
ALT: 34 U/L (ref 14–54)
AST: 25 U/L (ref 15–41)
Albumin: 3.5 g/dL (ref 3.5–5.0)
Alkaline Phosphatase: 85 U/L (ref 38–126)
Anion gap: 8 (ref 5–15)
BILIRUBIN TOTAL: 0.5 mg/dL (ref 0.3–1.2)
BUN: 6 mg/dL (ref 6–20)
CHLORIDE: 95 mmol/L — AB (ref 101–111)
CO2: 24 mmol/L (ref 22–32)
Calcium: 8.8 mg/dL — ABNORMAL LOW (ref 8.9–10.3)
Creatinine, Ser: 0.52 mg/dL (ref 0.44–1.00)
Glucose, Bld: 117 mg/dL — ABNORMAL HIGH (ref 65–99)
POTASSIUM: 3.9 mmol/L (ref 3.5–5.1)
Sodium: 127 mmol/L — ABNORMAL LOW (ref 135–145)
TOTAL PROTEIN: 6.6 g/dL (ref 6.5–8.1)

## 2014-11-16 LAB — CBC WITH DIFFERENTIAL/PLATELET
BASOS ABS: 0 10*3/uL (ref 0.0–0.1)
BASOS PCT: 0 % (ref 0–1)
EOS PCT: 0 % (ref 0–5)
Eosinophils Absolute: 0 10*3/uL (ref 0.0–0.7)
HEMATOCRIT: 33.9 % — AB (ref 36.0–46.0)
Hemoglobin: 11.7 g/dL — ABNORMAL LOW (ref 12.0–15.0)
LYMPHS PCT: 7 % — AB (ref 12–46)
Lymphs Abs: 0.6 10*3/uL — ABNORMAL LOW (ref 0.7–4.0)
MCH: 31.5 pg (ref 26.0–34.0)
MCHC: 34.5 g/dL (ref 30.0–36.0)
MCV: 91.4 fL (ref 78.0–100.0)
Monocytes Absolute: 0.9 10*3/uL (ref 0.1–1.0)
Monocytes Relative: 10 % (ref 3–12)
NEUTROS ABS: 7.3 10*3/uL (ref 1.7–7.7)
Neutrophils Relative %: 83 % — ABNORMAL HIGH (ref 43–77)
PLATELETS: 425 10*3/uL — AB (ref 150–400)
RBC: 3.71 MIL/uL — AB (ref 3.87–5.11)
RDW: 12.1 % (ref 11.5–15.5)
WBC: 8.8 10*3/uL (ref 4.0–10.5)

## 2014-11-16 LAB — MAGNESIUM: MAGNESIUM: 1.9 mg/dL (ref 1.7–2.4)

## 2014-11-16 MED ORDER — ONDANSETRON HCL 4 MG/2ML IJ SOLN
4.0000 mg | Freq: Four times a day (QID) | INTRAMUSCULAR | Status: DC | PRN
  Administered 2014-11-16 – 2014-11-18 (×6): 4 mg via INTRAVENOUS
  Filled 2014-11-16 (×6): qty 2

## 2014-11-16 MED ORDER — IBUPROFEN 200 MG PO TABS: 400.0000 mg | ORAL_TABLET | ORAL | Status: DC | PRN

## 2014-11-16 MED ORDER — LORAZEPAM 2 MG/ML IJ SOLN
1.0000 mg | Freq: Once | INTRAMUSCULAR | Status: DC
  Filled 2014-11-16: qty 1

## 2014-11-16 MED ORDER — SODIUM CHLORIDE 0.9 % IJ SOLN: 9.0000 mL | INTRAMUSCULAR | Status: DC | PRN

## 2014-11-16 MED ORDER — OXYCODONE HCL 5 MG PO TABS
5.0000 mg | ORAL_TABLET | ORAL | Status: DC | PRN
  Administered 2014-11-16 – 2014-11-18 (×6): 5 mg via ORAL
  Filled 2014-11-16 (×7): qty 1

## 2014-11-16 MED ORDER — KETOROLAC TROMETHAMINE 30 MG/ML IJ SOLN
30.0000 mg | Freq: Three times a day (TID) | INTRAMUSCULAR | Status: AC
  Administered 2014-11-16 – 2014-11-18 (×5): 30 mg via INTRAVENOUS
  Filled 2014-11-16 (×5): qty 1

## 2014-11-16 MED ORDER — DOCUSATE SODIUM 100 MG PO CAPS
100.0000 mg | ORAL_CAPSULE | Freq: Two times a day (BID) | ORAL | Status: DC
  Administered 2014-11-16 – 2014-11-18 (×5): 100 mg via ORAL
  Filled 2014-11-16 (×6): qty 1

## 2014-11-16 MED ORDER — SODIUM CHLORIDE 0.9 % IJ SOLN
3.0000 mL | Freq: Two times a day (BID) | INTRAMUSCULAR | Status: DC
  Administered 2014-11-19 – 2014-11-21 (×3): 3 mL via INTRAVENOUS

## 2014-11-16 MED ORDER — PROMETHAZINE HCL 25 MG PO TABS: 12.5000 mg | ORAL_TABLET | Freq: Four times a day (QID) | ORAL | Status: DC | PRN

## 2014-11-16 MED ORDER — ALPRAZOLAM 0.5 MG PO TABS
0.5000 mg | ORAL_TABLET | Freq: Two times a day (BID) | ORAL | Status: DC | PRN
  Administered 2014-11-16: 0.5 mg via ORAL
  Filled 2014-11-16: qty 1

## 2014-11-16 MED ORDER — SODIUM CHLORIDE 0.9 % IV SOLN
INTRAVENOUS | Status: DC
  Administered 2014-11-16 – 2014-11-18 (×3): via INTRAVENOUS
  Administered 2014-11-19: 1000 mL via INTRAVENOUS
  Administered 2014-11-20 – 2014-11-21 (×2): via INTRAVENOUS

## 2014-11-16 MED ORDER — KETOROLAC TROMETHAMINE 15 MG/ML IJ SOLN
30.0000 mg | Freq: Three times a day (TID) | INTRAMUSCULAR | Status: DC
  Administered 2014-11-16: 30 mg via INTRAVENOUS

## 2014-11-16 MED ORDER — SODIUM CHLORIDE 0.9 % IV SOLN
8.0000 mg | Freq: Three times a day (TID) | INTRAVENOUS | Status: DC | PRN
  Administered 2014-11-16: 8 mg via INTRAVENOUS
  Filled 2014-11-16 (×3): qty 4

## 2014-11-16 MED ORDER — ENOXAPARIN SODIUM 40 MG/0.4ML ~~LOC~~ SOLN
40.0000 mg | SUBCUTANEOUS | Status: DC
  Administered 2014-11-16 – 2014-11-21 (×6): 40 mg via SUBCUTANEOUS
  Filled 2014-11-16 (×7): qty 0.4

## 2014-11-16 MED ORDER — LORAZEPAM 2 MG/ML IJ SOLN: 0.5000 mg | INTRAMUSCULAR | Status: DC | PRN

## 2014-11-16 MED ORDER — ZOLPIDEM TARTRATE 5 MG PO TABS: 5.0000 mg | ORAL_TABLET | Freq: Every evening | ORAL | Status: DC | PRN

## 2014-11-16 MED ORDER — MELATONIN 1 MG PO TABS: 3.0000 mg | ORAL_TABLET | Freq: Every evening | ORAL | Status: DC | PRN

## 2014-11-16 MED ORDER — DIPHENHYDRAMINE HCL 12.5 MG/5ML PO ELIX: 12.5000 mg | ORAL_SOLUTION | Freq: Four times a day (QID) | ORAL | Status: DC | PRN

## 2014-11-16 MED ORDER — PANTOPRAZOLE SODIUM 40 MG IV SOLR
40.0000 mg | Freq: Every day | INTRAVENOUS | Status: DC
  Administered 2014-11-16: 40 mg via INTRAVENOUS
  Filled 2014-11-16 (×2): qty 40

## 2014-11-16 MED ORDER — KETOROLAC TROMETHAMINE 15 MG/ML IJ SOLN: 30.0000 mg | Freq: Three times a day (TID) | INTRAMUSCULAR | Status: DC

## 2014-11-16 MED ORDER — DIPHENHYDRAMINE HCL 50 MG/ML IJ SOLN: 12.5000 mg | Freq: Four times a day (QID) | INTRAMUSCULAR | Status: DC | PRN

## 2014-11-16 MED ORDER — HYDROMORPHONE 0.3 MG/ML IV SOLN
INTRAVENOUS | Status: DC
  Administered 2014-11-16: 0.99 mg via INTRAVENOUS
  Administered 2014-11-16: 2.5 mg via INTRAVENOUS
  Administered 2014-11-16: 13:00:00 via INTRAVENOUS
  Administered 2014-11-16: 0.4 mg via INTRAVENOUS
  Administered 2014-11-17: 0.6 mg via INTRAVENOUS
  Administered 2014-11-17: 1.79 mg via INTRAVENOUS
  Administered 2014-11-17: 0.799 mg via INTRAVENOUS
  Administered 2014-11-17: 0.6 mg via INTRAVENOUS
  Administered 2014-11-17: 10:00:00 via INTRAVENOUS
  Administered 2014-11-17: 0.99 mg via INTRAVENOUS
  Administered 2014-11-18: 0.68 mg via INTRAVENOUS
  Administered 2014-11-18: 1.09 mg via INTRAVENOUS
  Administered 2014-11-18: 1.19 mg via INTRAVENOUS
  Administered 2014-11-18: 0.799 mg via INTRAVENOUS
  Filled 2014-11-16 (×2): qty 25

## 2014-11-16 MED ORDER — PROMETHAZINE HCL 25 MG RE SUPP: 25.0000 mg | Freq: Four times a day (QID) | RECTAL | Status: DC | PRN

## 2014-11-16 MED ORDER — POLYETHYLENE GLYCOL 3350 17 G PO PACK
17.0000 g | PACK | Freq: Every day | ORAL | Status: DC | PRN
  Administered 2014-11-16: 17 g via ORAL
  Filled 2014-11-16: qty 1

## 2014-11-16 MED ORDER — NALOXONE HCL 0.4 MG/ML IJ SOLN: 0.4000 mg | INTRAMUSCULAR | Status: DC | PRN

## 2014-11-16 MED ORDER — HYDROMORPHONE HCL 1 MG/ML IJ SOLN
0.5000 mg | INTRAMUSCULAR | Status: DC | PRN
  Administered 2014-11-16 (×2): 0.5 mg via INTRAVENOUS
  Filled 2014-11-16 (×2): qty 1

## 2014-11-16 NOTE — Progress Notes (Signed)
[facility]  Patient Status: Active (receiving services up to time of hospitalization)  [facility] is providing the following services: RN  If patient discharges after hours, please call [phone].   [name] [name] 11/16/2014, 11:52 AM

## 2014-11-16 NOTE — Progress Notes (Signed)
CC: Dr. Gunnar Bulla Magrinat   Inpatient follow-up note:  [name] [name] is a 54 year old female, well-known to [place] having completed palliative radiotherapy to her left chest in the management of her metastatic carcinoma of the right breast approximately 3 months ago.  She has been followed closely by Dr. Gunnar Bulla Magrinat.  Her staging PET scan on 10/30/2014 showed interval mixed response to therapy.  Her treated left sided chest/paramediastinal disease regressed although untreated disease at T11 showed extension into the neural foramen on the left.  She also has new hypermetabolic left axillary and left hilar adenopathy.  Brain MRI on 10/18/2014 did show a new 2 cm left parietal skull metastasis extending through the inner table to the dura.  Her major complaint is that of left-sided mid back pain radiating to her left flank.  She denies headaches or any neurologic symptoms.  She was admitted to the hospital earlier this week because of worsening pain and intolerance to narcotics causing nausea.  She was discharged late yesterday but returned overnight because of worsening left-sided back pain.  She is scheduled see me for a follow-up visit this afternoon.  She is on a Dilaudid drip and her pain is somewhat improved.  Physical examination: Alert and oriented.  She is lying on her left side. Wt Readings from Last 3 Encounters:  11/15/14 160 lb (72.576 kg)  10/18/14 160 lb (72.576 kg)  09/12/14 160 lb (72.576 kg)   Temp Readings from Last 3 Encounters:  11/16/14 97.7 F (36.5 C) Oral  11/15/14 98.4 F (36.9 C) Oral  11/11/14 97.9 F (36.6 C) Oral   BP Readings from Last 3 Encounters:  11/16/14 170/105  11/15/14 143/75  11/11/14 151/96   Pulse Readings from Last 3 Encounters:  11/16/14 111  11/15/14 114  11/11/14 98   Back: There is pain described along the left lower thoracic/upper lumbar paravertebral region.  She moves both lower extremities.  Neurologic examination is grossly  nonfocal.  Impression: Severe pain secondary to involvement of her left T11 vertebra with neural foraminal involvement resulting in nerve impingement with neuropathy.  We will have her visit [place] today for CT simulation.  We will begin her treatment by tomorrow.  I anticipate 2 weeks of palliative radiation therapy.  Consent will be signed by her husband when they visit our Department.  Plan: As above.  20 minutes was spent face-to-face with the patient, primarily counseling patient and coordinating her care.

## 2014-11-16 NOTE — Progress Notes (Signed)
3-D simulation note: The patient completed 3-D simulation today for treatment to her lower thoracic spine/paravertebral region.  She was set up to LAO and RPO oblique fields to avoid overlap with the previous rib fields.  2 separate and unique MLCs were designed.  I am prescribing 400 cGy in a single fraction followed by 2700 cGy 9 fractions utilizing 15 MV photons.  Dose volume histograms were obtained for the kidneys and spinal cord.  We met our departmental guidelines.

## 2014-11-16 NOTE — H&P (Signed)
Progress Notes   [name] [name] (MR# [id number])      Progress Notes Info    Author Note Status Last Update User Last Update Date/Time   Chauncey Cruel, MD Signed Chauncey Cruel, MD 11/14/2014 5:36 PM    Progress Notes    Expand All Collapse All    [place]  [phone] [phone]     ID: [name] [name] OB: 08/14/1960 MR#: [id number]   PCP: Pcp Not In System GYN: [name] [name] SU:  OTHER MD: [name] [name], [place], Louretta Parma Spiritos, [name]  CHIEF COMPLAINT: Stage IV breast cancer  CURRENT TREATMENT: Fulvestrant pending  BREAST CANCER HISTORY: From doctor [name] [name]'s intake note 03/20/2004:  "The patient is a very pleasant 54 year old female, without significant past medical history. Her family history is significant for a sister who at age 47 was diagnosed with invasive ductal carcinoma. She is a breast cancer survivor at age 32 now. The patient states that she has never really had a screening mammogram until October 2005, when she felt that it was time for her to start having mammograms done on a yearly basis. Therefore, on 01/26/04, she underwent a screening mammogram and an abnormality was detected in the upper outer right breast. She, therefore, underwent spot compression views of both the right and the left breast. The left breast revealed a well-defined mass in the upper outer left quadrant, present at the 2 o'clock position, measuring 1.8 cm, 6 cm from the nipple. This, by ultrasound, was felt to be a simple cyst measuring 1.8 cm. On the right breast, a spiculated mass was noted in the upper outer right quadrant. The ultrasound revealed a shadowing irregular solid mass at the 10:30 position, 9 cm from the nipple, measuring 1.2 cm in greatest dimension, correlating with the spiculated mass seen on the mammogram. The right axilla was  negative ultrasonically. Because of this, the patient underwent a needle biopsy of the right breast and the biopsy was positive invasive mammary carcinoma that showed features consistent with a high-grade invasive ductal carcinoma associated with desmoplastic stroma. No in situ component was seen and no definite lymphovascular invasion was identified. On the core biopsy, the tumor measured about 0.8 cm. Because of this, she was seen by Dr. [name] and the patient was taken to the [facility] on March 15, 2004. She underwent a right breast lumpectomy with sentinel node biopsy. The final pathology revealed an invasive ductal carcinoma, measuring 1.7 cm, grade 2 of 3. Margins were free of tumor. Atypical lobular hyperplasia was noted. One sentinel node was removed which was negative for metastatic disease. The tumor was staged at T1c, N0 MX. It was estrogen receptor positive, progesterone receptor positive. HER-2/neu was 2+. FISH was negative. All margins were free of tumor. She is now seen in Medical Oncology for further evaluation and management of this newly diagnosed T1c, node negative, stage I, invasive ductal carcinoma of the right breast."  Her subsequent history is as detailed below  INTERVAL HISTORY: [name] [name] is being admitted again today for uncontrolled pain. She was recently admitted with the same complaint but at that time refused most of the pain medication suggested by the M.D. She was discharged essentially on the same medications that I had proposed to her and her husband a few days before, which have not been able to hold her pain.  Accordingly it is not surprising that last night she again was  in pain crisis. It was "horrible" according to the patient's husband. They requested admission.  I explained that I will be glad to admit [name] [name], but if she is not agreeable to trying the pain medications that we have available she is not likely to get any relief. At this point the  patient appears to be agreeable to trying some stronger pain medications although she does have a history of being unusually sensitive to all medicines. We are accordingly proceeding to admission for pain control, which will allow her some time for Dr. 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 She had a significant left pleural effusion which however has largely resolved after her recent radiation. She does have some loculated fluid. She has been scheduled for Pleurx removal sometime next week and radiology thinks it may be possible to try to drain this small amount of loculated fluid in that area which however will make of course very little if any difference as far as her pain is concerned. We have tried to control her pain with Lidoderm but she says that that increase the pain in that area. She used it for approximately one hour. She has had problems with vomiting with any narcotics including very minimal amounts of oxycodone. She tells me that nausea medicine simply does not work for her. She seems to do a little bit better on ondansetron then some of the other ones. She has been having increasing problems with constipation over the last several days.   PAST MEDICAL HISTORY: Past Medical History  Diagnosis Date  . Seizures 2010    Isolated incident.  Marland Kitchen PONV (postoperative nausea and vomiting)   . Peripheral vascular disease 02/2010    blood clot related to porta cath  . Breast cancer dx'd 2005/2011  . Bone metastases dx'd 05/2014  . S/P radiation therapy 07/17/2014 through 08/02/2014     Left mediastinum, left seventh rib 3250 cGy in 13 sessions     PAST SURGICAL HISTORY: Past  Surgical History  Procedure Laterality Date  . Breast lumpectomy  2005  . Axillary lymph node dissection  Dec. 2011  . Portacath placement  12/11  . Removal portacath    . Mediastinotomy chamberlain mcneil Left 06/02/2013    Procedure: MEDIASTINOTOMY CHAMBERLAIN MCNEIL; Surgeon: [name], MD; Location: [facility] OR; Service: Thoracic; Laterality: Left; LEFT ANTERIOR MEDIASTINOTOMY     FAMILY HISTORY Family History  Problem Relation Age of Onset  . COPD Mother   . Breast cancer Sister 58   The patient's father is living, 27 years old as of may 2015. He lives in [place]. The patient's mother died from complications of COPD at the age of 12. These has 2 brothers, one sister. Her sister developed breast cancer at the age of 26. She is doing well. The patient herself underwent genetic testing at [facility] in 2011 and was found to be BRCA negative  GYNECOLOGIC HISTORY:  Menarche age 36, she is GX P0. She stopped having periods with her initial chemotherapy in 2006.  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 Her husband [name] [name] is a Medical illustrator in [place]. He has a child from a prior marriage. At home they have 2 rescue dogs, Hobo and [place].  The patient is religious but not a church attender  ADVANCED DIRECTIVES: In place; at the 08/04/2014 visit in particular the patient was very clear, with her husband present, that she would not want any kind of feeding tubes or "other tubes" if her condition deteriorated.   HEALTH MAINTENANCE: Social History  Substance Use Topics  . Smoking status: Never Smoker   . Smokeless tobacco: Never Used  . Alcohol Use: No    Colonoscopy: PAP: Bone density: March 2015; mild osteopenia Lipid panel:  Allergies  Allergen Reactions  .  Decadron [Dexamethasone] Other (See Comments)    Patient does not tolerate steroids.   . Dilaudid [Hydromorphone]   . Enoxaparin Other (See Comments)    unknown  . Fluconazole Swelling  . Hydromorphone Hcl Nausea And Vomiting  . Morphine And Related   . Tegaderm Ag Mesh [Silver]     Current Outpatient Prescriptions  Medication Sig Dispense Refill  . ALPRAZolam (XANAX) 0.5 MG tablet Take 1 tablet (0.5 mg total) by mouth 2 (two) times daily as needed for anxiety. (Patient taking differently: Take 0.125 mg by mouth 2 (two) times daily as needed for anxiety. ) 30 tablet 0  . b complex vitamins capsule Take 1 capsule by mouth daily.    . Calcium-Magnesium-Vitamin D (CITRACAL CALCIUM+D PO) Take by mouth. 410m vitamin C, 50108mvitamin D3 2 PO DAILY    . cholecalciferol (VITAMIN D) 1000 UNITS tablet Take 1,000 Units by mouth daily. Takes 2,000 Iu daily    . exemestane (AROMASIN) 25 MG tablet Take 25 mg by mouth daily after breakfast.   4  . folic acid (FOLVITE) 1 MG tablet Take 1 mg by mouth daily.    . Marland Kitchenbuprofen (ADVIL,MOTRIN) 200 MG tablet Take 400 mg by mouth every 4 (four) hours as needed for moderate pain.     . Marland Kitchenidocaine (LIDODERM) 5 % Place 1 patch onto the skin daily. Remove & Discard patch within 12 hours or as directed by MD 30 patch 3  . MAGNESIUM PO Take 0.5 tablets by mouth daily as needed (constipation).    . Melatonin 1 MG TABS Take 3 mg by mouth at bedtime as needed (sleep).     . methocarbamol (ROBAXIN) 500 MG tablet Take 1 tablet (500 mg total) by mouth every 8 (eight) hours as needed for muscle spasms. 20 tablet 0  . naproxen (NAPROSYN) 375 MG tablet Take 1 tablet (375 mg total) by mouth 2 (two) times daily. 20 tablet 0  . oxyCODONE (OXY IR/ROXICODONE) 5 MG immediate release tablet Take 1 tablet (5 mg total) by mouth every 4 (four) hours as needed for severe pain. (Patient taking  differently: Take 1.25 mg by mouth every 4 (four) hours as needed for severe pain. ) 30 tablet 0  . prochlorperazine (COMPAZINE) 5 MG tablet Take 1 tablet (5 mg total) by mouth every 6 (six) hours as needed for nausea or vomiting. 30 tablet 0  . [DISCONTINUED] metoCLOPramide (REGLAN) 10 MG tablet Take 1 tablet (10 mg total) by mouth 4 (four) times daily - before meals and at bedtime. 60 tablet 3   No current facility-administered medications for this visit.    OBJECTIVE:  The patient has consistently refused vitals for her last several visits here   ECOG FS:3  Exam as of 11/14/2014. To be repeated after the patient reaches a room Sclerae unicteric, EOMs intact Oropharynx clear, dentition in good repair No cervical or supraclavicular adenopathy Lungs decreased breath sounds throughout the left lung area,  no wheezes Heart regular rate and rhythm Abd soft, nontender, positive bowel sounds MSK no focal spinal tenderness Neuro: nonfocal, well oriented, distraught affect Breasts: Deferred   LAB RESULTS:   CMP  Labs (Brief)       Component Value Date/Time   NA 136 11/14/2014 1328   NA 134* 09/03/2013 2145   K 4.4 11/14/2014 1328   K 3.7 09/03/2013 2145   CL 97 09/03/2013 2145   CL 105 05/06/2012 1333   CO2 25 11/14/2014 1328   CO2 23 09/03/2013 2145   GLUCOSE 113 11/14/2014 1328   GLUCOSE 121* 09/03/2013 2145   GLUCOSE 124* 05/06/2012 1333   BUN 10.0 11/14/2014 1328   BUN 8 09/03/2013 2145   CREATININE 0.7 11/14/2014 1328   CREATININE 0.64 09/03/2013 2145   CALCIUM 10.1 11/14/2014 1328   CALCIUM 9.0 09/03/2013 2145   PROT 7.6 11/14/2014 1328   PROT 6.8 09/03/2013 2145   ALBUMIN 3.7 11/14/2014 1328   ALBUMIN 3.3* 09/03/2013 2145   AST 33 11/14/2014 1328   AST 55* 09/03/2013 2145   ALT 48 11/14/2014 1328   ALT 85* 09/03/2013 2145   ALKPHOS 115  11/14/2014 1328   ALKPHOS 114 09/03/2013 2145   BILITOT 0.31 11/14/2014 1328   BILITOT 0.3 09/03/2013 2145   GFRNONAA >90 09/03/2013 2145   GFRAA >90 09/03/2013 2145      No results found for: SPEP  Lab Results  Component Value Date   WBC 8.2 11/14/2014   NEUTROABS 6.6* 11/14/2014   HGB 13.0 11/14/2014   HCT 38.0 11/14/2014   MCV 93.2 11/14/2014   PLT 510* 11/14/2014     Chemistry    Labs (Brief)       Component Value Date/Time   NA 136 11/14/2014 1328   NA 134* 09/03/2013 2145   K 4.4 11/14/2014 1328   K 3.7 09/03/2013 2145   CL 97 09/03/2013 2145   CL 105 05/06/2012 1333   CO2 25 11/14/2014 1328   CO2 23 09/03/2013 2145   BUN 10.0 11/14/2014 1328   BUN 8 09/03/2013 2145   CREATININE 0.7 11/14/2014 1328   CREATININE 0.64 09/03/2013 2145      Labs (Brief)       Component Value Date/Time   CALCIUM 10.1 11/14/2014 1328   CALCIUM 9.0 09/03/2013 2145   ALKPHOS 115 11/14/2014 1328   ALKPHOS 114 09/03/2013 2145   AST 33 11/14/2014 1328   AST 55* 09/03/2013 2145   ALT 48 11/14/2014 1328   ALT 85* 09/03/2013 2145   BILITOT 0.31 11/14/2014 1328   BILITOT 0.3 09/03/2013 2145         Lab Results  Component Value Date   LABCA2 25 11/02/2007    No components found for: [id number]   Last Labs     No results for input(s): INR in the last 168 hours.    Urinalysis  Labs (Brief)       Component Value Date/Time   COLORURINE YELLOW 09/03/2013 2109   APPEARANCEUR CLEAR 09/03/2013 2109   LABSPEC 1.005 09/12/2013 1542   LABSPEC 1.016 09/03/2013 2109   PHURINE 6.0 09/12/2013 1542   PHURINE 6.0 09/03/2013 2109   GLUCOSEU Negative 09/12/2013 [place] 09/03/2013 2109   HGBUR Negative 09/12/2013 [place] 09/03/2013 2109    BILIRUBINUR Negative 09/12/2013 [place] 09/03/2013 2109   KETONESUR Negative 09/12/2013 1542   [place] 09/03/2013 2109   PROTEINUR Negative 09/12/2013 1542  PROTEINUR NEGATIVE 09/03/2013 2109   UROBILINOGEN 0.2 09/12/2013 1542   UROBILINOGEN 0.2 09/03/2013 2109   NITRITE Negative 09/12/2013 1542   NITRITE NEGATIVE 09/03/2013 2109   LEUKOCYTESUR Negative 09/12/2013 1542   LEUKOCYTESUR NEGATIVE 09/03/2013 2109      STUDIES:  Imaging Results    Dg Chest 2 View  11/13/2014 CLINICAL DATA: Follow-up of pleural effusion EXAM: CHEST 2 VIEW COMPARISON: PA and lateral chest x-ray of November 08, 2014 FINDINGS: The right lung is adequately inflated and clear. On the left there is a persistent moderate-sized pleural effusion. The left-sided chest tube is unchanged in position with its tip projecting over the posterior aspect of the fifth rib. There is patchy alveolar opacity in the left upper lobe which is stable. The left hemidiaphragm is elevated and obscured. There is curvature of the thoracolumbar spine and S-shaped configuration. There is a nonspecific gas pattern in the upper abdomen. IMPRESSION: Stable appearance of the moderate-sized left pleural effusion and patchy alveolar opacity in the left upper lobe. The left chest tube is unchanged in position with its tip above the pleural effusion at the level of the posterior fifth rib. Electronically Signed By: David Martinique M.D. On: 11/13/2014 11:47   Dg Chest 2 View  11/08/2014 CLINICAL DATA: History of breast cancer and left pleural catheter. Evaluate for a left pleural effusion. Decreased fluid drainage but fullness and pain in the left lower chest. EXAM: CHEST 2 VIEW COMPARISON: 10/05/2014 and 10/30/2014 FINDINGS: Stable position of the left pleural catheter. There are densities in the left upper lobe that correspond with the nodular disease seen on  the PET-CT from 10/30/2014. There continues to be a pleural collection at the left lung base which has minimally changed in size. Right lung remains clear. Surgical clips in the right axilla. Heart and mediastinum are stable. Negative for a pneumothorax. IMPRESSION: Persistent parenchymal densities in the left upper lobe which were present on the recent PET-CT. These densities are nonspecific and may be better characterized with a follow-up chest CT. Stable position of the left chest tube. There is persistent loculated fluid at the left lung base which has minimally changed. Electronically Signed By: Markus Daft M.D. On: 11/08/2014 09:47   Mr Jeri Cos QT Contrast  10/18/2014 CLINICAL DATA: History of right upper inner quadrant breast cancer. Prior seizure in 06/2008. Evaluation for metastatic disease. EXAM: MRI HEAD WITHOUT AND WITH CONTRAST TECHNIQUE: Multiplanar, multiecho pulse sequences of the brain and surrounding structures were obtained without and with intravenous contrast. CONTRAST: 40m MULTIHANCE GADOBENATE DIMEGLUMINE 529 MG/ML IV SOLN COMPARISON: Prior outside brain MRI 05/20/2010 [place] [facility] Dedicated thin section imaging through the temporal lobes demonstrates normal volume and signal of the hippocampi. There is no evidence of acute infarct, intracranial hemorrhage, intra-axial mass, midline shift, or extra-axial fluid collection. There is mild generalized cerebral atrophy, unchanged. No significant cerebral white matter disease is identified. No enhancing parenchymal brain lesions are identified. There is a new 2 cm enhancing lesion in the left parietal bone which extends through the inner table to the dura, which appears mildly thickened. Orbits are unremarkable. No significant inflammatory disease is seen in the paranasal sinuses or mastoid air cells. Major intracranial vascular flow voids are preserved. IMPRESSION: 1. No evidence of brain metastases. 2.  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 Electronically Signed By: ALogan BoresOn: 10/18/2014 16:12   Nm Pet Image Restag (ps) Skull Base To Thigh  10/30/2014 CLINICAL  DATA: Subsequent treatment strategy for breast cancer. EXAM: NUCLEAR MEDICINE PET SKULL BASE TO THIGH TECHNIQUE: 8.0 mCi F-18 FDG was injected intravenously. Full-ring PET imaging was performed from the skull base to thigh after the radiotracer. CT data was obtained and used for attenuation correction and anatomic localization. FASTING BLOOD GLUCOSE: Value: 88 mg/dl COMPARISON: 03/29/2014 FINDINGS: NECK No hypermetabolic lymph nodes in the neck. CHEST Left axillary node measures 0.9 cm and has an SUV max equal to 11.05. New from previous exam. Resolution of previous hyper metabolism associated with the sub- carinal lymph node. There is a new hypermetabolic left infrahilar lymph node which has an SUV max equal to 6.8. Left-sided chest tube is in place. The loculated left pleural effusion is stable to decreased in volume from previous exam. Again noted is extensive hypermetabolic trans pleural spread of tumor involving the left hemi thorax. Anterior paramediastinal pleural tumor measures 5 x 1.6 cm and has an SUV max equal to 4.28. On the previous exam this measured 7.3 x 3.3 cm and had an SUV max equal to 14.28.Tumor invading the ventral chest wall appears decreased from previous exam. On today's study this measures approximately 1.3 cm in thickness and has an SUV max equal to 6.24. On the previous exam this measure 2.1 cm in thickness and had an SUV max equal to 14.28. Bulky pleural tumor at the posterior medial left base is again noted involving the T11 neuro foramina and extending into the last lateral recess of the canal it this level. On today's study this tumor measures approximately 5.9 cm and has an SUV max equal to 10.5. Previously this measured approximately 5.4 cm and had  an SUV max equal to 12.9. Patchy areas of consolidation and ground-glass attenuation are identified within the left lung and appear new from previous exam. These are nonspecific and may reflect changes of external beam radiation. Pulmonary parenchymal metastasis not excluded. Index nodule within the left upper lobe measures 2.1 cm and has an SUV max equal to 3.7. Within the superior segment of the right lower lobe there is a new parenchymal nodule measuring 11 mm within SUV max equal to 2.9. ABDOMEN/PELVIS No abnormal hypermetabolic activity within the liver, pancreas, adrenal glands, or spleen. 1.6 cm periaortic lymph node is identified within SUV max equal to 11.6. Previously this lymph node measured 1.4 cm and had an SUV max equal 7.5. Adjacent lymph node measures 1.1 cm and has an SUV max of 8.4. Previously this mesh 0.9 cm and had an SUV max equal to 9.28. More caudal cluster of periaortic lymph nodes measures 2.5 cm and has an SUV max equal to 6.3 cm. Previously 1.9 cm within SUV max equal to 6.3. No enlarged or hypermetabolic pelvic or inguinal lymph nodes. SKELETON No focal hypermetabolic activity to suggest skeletal metastasis. IMPRESSION: 1. Interval mixed response to therapy. Specifically, the bulky pleural based tumor along the paramediastinal left upper lobe extending into the ventral chest wall has decreased in size and degree of FDG uptake. Additionally, there is been resolution of previous hyper metabolism associated with the sub- carinal adenopathy. Persistent pleural based tumor overlying the posterior and medial left lung base is again noted. There is continued extension into the T11 neuro foramina and lateral recess of the canal. 2. New hypermetabolic left axillary and left hilar adenopathy. Additionally, persistent hypermetabolic adenopathy within the upper abdomen demonstrates mild increase in size in the interval. 3. Bilateral pulmonary nodular opacities are identified and are  nonspecific. These exhibit malignant range  FDG uptake and are new from previous exam. Findings may reflect nonspecific pneumonitis and may also be seen with changes of external beam radiation. Pulmonary metastasis not excluded. Electronically Signed By: Kerby Moors M.D. On: 10/30/2014 16:12   [place] Radiologist Eval & Mgmt  11/14/2014 CLINICAL DATA: Referring physician: Lurline Del Reason for consultation: Left PleurX Past medical history: Pleural effusionBreast cancer of upper-inner quadrant of right female breast, Secondary malignant neoplasm of mediastinal lymph node, Abnormal LFTs (liver function tests), Chest wall pain, Post-lymphadenectomy lymphedema of arm HISTORY OF PRESENT ILLNESS: Metastatic breast carcinoma. Large malignant left pleural effusion. PleurX catheter placed 09/01/2013 by Dr. Pascal Lux. Decreasing output since July. PET-CT 10/30/2014 demonstrated residual loculated subpulmonic effusion with extensive associated pleural metastatic disease. This has remained grossly stable on subsequent chest x-rays through 11/13/2014. The remainder of the fairly extensive effusion seen preprocedure has resolved. There is involvement of a left lower thoracic neural foramen from extension of pleural disease. She is not having any problems at the skin entry site of the PleurX catheter. PROCEDURE: We connected the catheter up for drainage but could only get about 10 cc. ASSESSMENT AND PLAN: My impression is that she has had extensive pleurodesis secondary to the presence of the PleurX catheter with resolution of much of the previously noted malignant left pleural effusion. The residual loculated subpulmonic fluid is associated with a moderate amount of adjacent pleural metastatic disease and and not amenable to repositioning of the catheter. In fact, this may not even significantly decompress with thoracentesis if it is loculated or inaccessible due to overlying tumor. I discussed the findings with  the patient and her spouse. My recommendation is that we remove the catheter. She is interested in trying thoracentesis, to aspirate the residual subpulmonic effusion at the same time, in hopes of some symptomatic relief, and I think it would be a reasonable to look with ultrasound to see if this is feasible. We can schedule this next week with Dr. Pascal Lux as she has requested. Electronically Signed By: Lucrezia Europe M.D. On: 11/14/2014 16:21     ASSESSMENT:54 y.o. [name] woman with stage IV breast cancer, admitted with uncontrolled chest wall pain  (1) S/p Right lumpectomy and sentinel lymph node sampling 03/15/2004 for a pT1c pN0. Stage IA invasive ductal carcinoma, grade 2, estrogen receptor 95% positive, progesterone receptor 65% positive, HER-2 not amplified; additional surgery 04/25/2004 for seroma or clearance showed no residual tumor  (2) adjuvant chemotherapy with cyclophosphamide and doxorubicin every 21 days x4 completed 07/19/2004  (3) adjuvant radiation given under Dr. Donella Stade in [place] completed July 2006  (4) the patient opted against adjuvant antiestrogen therapy  (5) genetics testing showed no BRCA mutations  (6) biopsy of a palpable right axillary mass 10/24/2009 showed invasive ductal carcinoma, grade 3, estrogen receptor 100% positive, progesterone receptor 2% positive (alert score 5) HER-2 negative; no evidence of systemic disease on PET scanning  (7) completed 3 of 4 planned cycles of docetaxel and cyclophosphamide September 2011, fourth cycle omitted because of marked elevations in liver function tests  (8) an right axillary lymph node dissection 03/06/2010 showed 3/8 lymph nodes removed to be involved by tumor, with extracapsular extension.  (9) 45 Gy radiation to the right axillary and right supraclavicular nodal areas, with capecitabine sensitization, completed March 2012   (10) intolerant of letrozole and exemestane; on tamoxifen with interruptions  September 2012 to March 2013, but then continuing on tamoxifen more continuously through March of 2015  (11) biopsy of mediastinal adenopathy 06/02/2013 shows invasive ductal  carcinoma (gross cystic disease fluid protein positive, TTS-1 negative), estrogen receptor 80% positive, progesterone receptor 2% positive, HER-2 not amplified  (12) letrozole started March 2015-- tolerated with significant side effects, discontinued at the end of May 2015  (13) PET scan 08/16/2013 shows extensive left pleural metastatic disease and a large left pleural effusion that shifts cardiac and mediastinal structures to the right; adenopathy (celiac trunk, periadrenal, periaortic); and a left medial clavicular lesion; Status post left thoracentesis 08/16/2013 positive for adenocarcinoma, estrogen receptor positive, progesterone receptor negative.  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; everolimus was suggested as an available targeted agent  (18) exemestane started 03/31/2014, discontinued 10/31/2014 with evidence of progression (a) everolimus added 04/03/2014 but not tolerated (cytopenias, elevated LFTs) even at minimal doses; stopped 04/17/2014  (19) fulvestrant to start 11/14/2014-- postponed   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 treated with Lovenox for 5-6 months  (c) right upper extremity  lymphedema  (d) hepatic steatosis with chronically elevated LFTs as well as unusual hepatic sensitivity to chemotherapy  (e) osteopenia with the lowest T score -1.6 on bone density scan 06/20/2013  (f) radiation oncology (Dr Valere Dross) has reviewed prior radiation records in case there is further mediastinal involvement with dysphagia etc in which case palliative XRT could be considered (a) radiation to left mediastinum/ left 7th rib 3250 cGy in 13 sessions04/18/2016 through 08/02/2014  (g) chest wall pain  (h) constipation   PLAN: Neema's situation is difficult. She has documented sensitivity to multiple agents, and in general tolerates medication poorly. She is also exceedingly anxious. At the same time she has extensive cancer in the left chest wall and this is causing terrible pain.  I don't think we have any option but to go with narcotics. I will do my best to make sure her anxiety, nausea, and constipation are taking care of, but I don't think we will be able to bring the pain under control without dilated. She did respond well to Toradol in the most recent admission and we will return to that as well.   I will asked Dr. Arloa [name] to consult so we can start radiation if he feels it would help palliate the pain as soon as possible.   I'm making the patient is full code at this time and once we can discuss advanced directives in more detail she likely will be a limited code since she has expressed a clear desire not to be intubated in case of a terminal event.  Chauncey Cruel, MD 10:05 AM 11/16/2014

## 2014-11-17 ENCOUNTER — Encounter (HOSPITAL_COMMUNITY): Payer: Self-pay

## 2014-11-17 ENCOUNTER — Ambulatory Visit: Payer: 59 | Admitting: Physical Therapy

## 2014-11-17 ENCOUNTER — Ambulatory Visit
Admit: 2014-11-17 | Discharge: 2014-11-17 | Disposition: A | Discharge status: Home / Self Care | Payer: 59 | Attending: Radiation Oncology | Admitting: Radiation Oncology

## 2014-11-17 DIAGNOSIS — K5900 Constipation, unspecified: Secondary | ICD-10-CM

## 2014-11-17 LAB — BASIC METABOLIC PANEL
Anion gap: 7 (ref 5–15)
BUN: 7 mg/dL (ref 6–20)
CALCIUM: 8.6 mg/dL — AB (ref 8.9–10.3)
CO2: 26 mmol/L (ref 22–32)
CREATININE: 0.47 mg/dL (ref 0.44–1.00)
Chloride: 96 mmol/L — ABNORMAL LOW (ref 101–111)
GFR calc non Af Amer: >60 mL/min (ref >60)
Glucose, Bld: 112 mg/dL — ABNORMAL HIGH (ref 65–99)
Potassium: 4.1 mmol/L (ref 3.5–5.1)
SODIUM: 129 mmol/L — AB (ref 135–145)

## 2014-11-17 LAB — URINALYSIS, ROUTINE W REFLEX MICROSCOPIC
BILIRUBIN URINE: NEGATIVE
Glucose, UA: NEGATIVE mg/dL
Hgb urine dipstick: NEGATIVE
KETONES UR: NEGATIVE mg/dL
LEUKOCYTES UA: NEGATIVE
NITRITE: NEGATIVE
PH: 6.5 (ref 5.0–8.0)
PROTEIN: NEGATIVE mg/dL
Specific Gravity, Urine: 1.01 (ref 1.005–1.030)
UROBILINOGEN UA: 0.2 mg/dL (ref 0.0–1.0)

## 2014-11-17 MED ORDER — PANTOPRAZOLE SODIUM 40 MG PO TBEC: 40.0000 mg | DELAYED_RELEASE_TABLET | Freq: Every day | ORAL | Status: DC

## 2014-11-17 MED ORDER — FLEET ENEMA 7-19 GM/118ML RE ENEM
1.0000 | ENEMA | Freq: Once | RECTAL | Status: AC
  Administered 2014-11-17: 1 via RECTAL
  Filled 2014-11-17: qty 1

## 2014-11-17 MED ORDER — MILK AND MOLASSES ENEMA
1.0000 | Freq: Once | RECTAL | Status: AC
  Administered 2014-11-17: 250 mL via RECTAL
  Filled 2014-11-17 (×2): qty 250

## 2014-11-17 MED ORDER — HYDROMORPHONE HCL 1 MG/ML IJ SOLN
0.5000 mg | INTRAMUSCULAR | Status: DC
  Administered 2014-11-17 – 2014-11-18 (×2): 0.5 mg via INTRAVENOUS

## 2014-11-17 MED ORDER — PANTOPRAZOLE SODIUM 40 MG IV SOLR
40.0000 mg | Freq: Every day | INTRAVENOUS | Status: DC
  Administered 2014-11-17 – 2014-11-20 (×4): 40 mg via INTRAVENOUS
  Filled 2014-11-17 (×6): qty 40

## 2014-11-17 MED ORDER — BISACODYL 10 MG RE SUPP
10.0000 mg | Freq: Once | RECTAL | Status: AC
  Administered 2014-11-17: 10 mg via RECTAL
  Filled 2014-11-17: qty 1

## 2014-11-17 NOTE — Progress Notes (Signed)
[name] [name]   DOB:Apr 13, 1960   VP#:[id number]   NIO#:[id number]  Subjective: Klea tells me her pain is "better, 7/10" but last night she fell asleep, didn't do the PCA for a few hours, and woke up about 2A with "terrible, 12/10" pain, requiring multiple nursing interventions. She is now back to a 7/10 this AM which is "OK"-- she is afraid of "overdoing it."-- She remains bedbound due to the pain. Had SIM yesterday and will start XRT today. No BM since 08/14. Family in room   Objective: middle aged White woman examined in bed Filed Vitals:   11/17/14 0804  BP:   Pulse:   Temp:   Resp: 19    Body mass index is 27.45 kg/(m^2).  Intake/Output Summary (Last 24 hours) at 11/17/14 8182 Last data filed at 11/17/14 0602  Gross per 24 hour  Intake      0 ml  Output   1900 ml  Net  -1900 ml     Sclerae unicteric  Oropharynx no thrush  No peripheral adenopathy  Lungs no wheezes, decreased BS on L--unchanged  Heart regular rate and rhythm  Abdomen soft, +BS  Neuro nonfocal--moves legs easily, no sensory evel  Breast exam: deferred  CBG (last 3)  No results for input(s): GLUCAP in the last 72 hours.   Labs:  Lab Results  Component Value Date   WBC 8.8 11/16/2014   HGB 11.7* 11/16/2014   HCT 33.9* 11/16/2014   MCV 91.4 11/16/2014   PLT 425* 11/16/2014   NEUTROABS 7.3 11/16/2014    @LASTCHEMISTRY @  Urine Studies No results for input(s): UHGB, CRYS in the last 72 hours.  Invalid input(s): UACOL, UAPR, USPG, UPH, UTP, UGL, UKET, UBIL, UNIT, UROB, ULEU, UEPI, UWBC, URBC, UBAC, CAST, [place], [place]  Basic Metabolic Panel:  Recent Labs Lab 11/14/14 1328 11/15/14 0524 11/16/14 1550  NA 136 141 127*  K 4.4 4.7 3.9  CL  --  104 95*  CO2 25 30 24   GLUCOSE 113 104* 117*  BUN 10.0 13 6  CREATININE 0.7 0.88 0.52  CALCIUM 10.1 9.3 8.8*  MG  --   --  1.9   GFR Estimated Creatinine Clearance: 77.6 mL/min (by C-G formula based on Cr of 0.52). Liver Function Tests:  Recent  Labs Lab 11/14/14 1328 11/16/14 1550  AST 33 25  ALT 48 34  ALKPHOS 115 85  BILITOT 0.31 0.5  PROT 7.6 6.6  ALBUMIN 3.7 3.5   No results for input(s): LIPASE, AMYLASE in the last 168 hours. No results for input(s): AMMONIA in the last 168 hours. Coagulation profile No results for input(s): INR, PROTIME in the last 168 hours.  CBC:  Recent Labs Lab 11/14/14 1328 11/15/14 0524 11/16/14 1550  WBC 8.2 8.1 8.8  NEUTROABS 6.6* 4.7 7.3  HGB 13.0 16.8* 11.7*  HCT 38.0 47.2* 33.9*  MCV 93.2 94.2 91.4  PLT 510* 202 425*   Cardiac Enzymes: No results for input(s): CKTOTAL, CKMB, CKMBINDEX, TROPONINI in the last 168 hours. BNP: Invalid input(s): POCBNP CBG: No results for input(s): GLUCAP in the last 168 hours. D-Dimer No results for input(s): DDIMER in the last 72 hours. Hgb A1c No results for input(s): HGBA1C in the last 72 hours. Lipid Profile No results for input(s): CHOL, HDL, LDLCALC, TRIG, CHOLHDL, LDLDIRECT in the last 72 hours. Thyroid function studies No results for input(s): TSH, T4TOTAL, T3FREE, THYROIDAB in the last 72 hours.  Invalid input(s): FREET3 Anemia work up No results for input(s): VITAMINB12, FOLATE,  FERRITIN, TIBC, IRON, RETICCTPCT in the last 72 hours. Microbiology No results found for this or any previous visit (from the past 240 hour(s)).    Studies:  No results found.  Assessment: 54 y.o. [place] woman with stage IV breast cancer, admitted with uncontrolled chest wall pain  (1) S/p Right lumpectomy and sentinel lymph node sampling 03/15/2004 for a pT1c pN0. Stage IA invasive ductal carcinoma, grade 2, estrogen receptor 95% positive, progesterone receptor 65% positive, HER-2 not amplified; additional surgery 04/25/2004 for seroma or clearance showed no residual tumor  (2) adjuvant chemotherapy with cyclophosphamide and doxorubicin every 21 days x4 completed 07/19/2004  (3) adjuvant radiation given under Dr. Donella Stade in [place]  completed July 2006  (4) the patient opted against adjuvant antiestrogen therapy  (5) genetics testing showed no BRCA mutations  (6) biopsy of a palpable right axillary mass 10/24/2009 showed invasive ductal carcinoma, grade 3, estrogen receptor 100% positive, progesterone receptor 2% positive (alert score 5) HER-2 negative; no evidence of systemic disease on PET scanning  (7) completed 3 of 4 planned cycles of docetaxel and cyclophosphamide September 2011, fourth cycle omitted because of marked elevations in liver function tests  (8) an right axillary lymph node dissection 03/06/2010 showed 3/8 lymph nodes removed to be involved by tumor, with extracapsular extension.  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, TTS-1 negative), estrogen receptor 80% positive, progesterone receptor 2% positive, HER-2 not amplified  (12) letrozole started March 2015-- tolerated with significant side effects, discontinued at the end of May 2015  (13) PET scan 08/16/2013 shows extensive left pleural metastatic disease and a large left pleural effusion that shifts cardiac and mediastinal structures to the right; adenopathy (celiac trunk, periadrenal, periaortic); and a left medial clavicular lesion; Status post left thoracentesis 08/16/2013 positive for adenocarcinoma, estrogen receptor positive, progesterone receptor negative.  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 mutation; everolimus was suggested as an available targeted agent  (18) exemestane started 03/31/2014, discontinued 10/31/2014 with evidence of progression (a) everolimus added 04/03/2014 but not tolerated (cytopenias, elevated LFTs) even at minimal doses; stopped 04/17/2014  (19) fulvestrant to start 11/14/2014-- postponed   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 treated with Lovenox for 5-6 months  (c) right upper extremity lymphedema  (d) hepatic steatosis with chronically elevated LFTs as well as unusual hepatic sensitivity to chemotherapy  (e) osteopenia with the lowest T score -1.6 on bone density scan 06/20/2013  (f) radiation oncology (Dr Valere Dross) has reviewed prior radiation records in case there is further mediastinal involvement with dysphagia etc in which case palliative XRT could be considered (a) radiation to left mediastinum/ left 7th rib 3250 cGy in 13 sessions04/18/2016 through 08/02/2014  (b)  radiation to painful chest area to start 11/17/2014, 2 weeks planned  (g) chest wall pain: on low-dose PCA pump (hydromorphone) plus toradol and PRN agents  (h) constipation: enemas 08/19 AM planned   Plan: Caera's pain is somewhat better controlled but she is still bedbound from the pain. She had her simulation 08/18 and will start her radiation treatments today 08/19.  Because she did not used the PCA for several hours last night (she was asleep) she woke up with "12/10" pain about 2 AM. To try to  fix this I have added dilaudid doses at 9P and 1A nightly. If that does not work we can move up the PCA to a higher level.  We are going to try to help her have a BM this AM.  Anticipate patient's pain to improve with XRT so that perhaps by mid-next-week we could switch to oral agents (though she tends to tolerate them poorly--vomiting, etc). No discharge this weekend.  Hyponatremia likely due to insufficient discard from port. Will repeat B-Met today.  Patient is a full code.  Chauncey Cruel, MD 11/17/2014  8:22 AM Medical Oncology and Hematology [facility] [address] [place], [place] [address] Tel. [phone]    Fax. [phone]

## 2014-11-17 NOTE — Progress Notes (Signed)
Pt given Dulcolax suppository, Fleets enema x 2 and milk of molasses enema with minimal results. Pt passed large amounts of flatus after enemas but no stool noted. Miralax offered and refused. Will continue to monitor.

## 2014-11-17 NOTE — Care Management Note (Signed)
Case Management Note  Patient Details  Name: NAJAH LIVERMAN MRN: [id number] Date of Birth: 25-Apr-1960  Subjective/Objective:     54 yo admitted with Pleural Effusion              Action/Plan: From home with spouse  Expected Discharge Date:   (unknown)               Expected Discharge Plan:  [place]  In-House Referral:     Discharge planning Services  CM Consult  Post Acute Care Choice:    Choice offered to:     DME Arranged:    DME Agency:     HH Arranged:    [place] Agency:     Status of Service:  In process, will continue to follow  Medicare Important Message Given:    Date Medicare IM Given:    Medicare IM give by:    Date Additional Medicare IM Given:    Additional Medicare Important Message give by:     If discussed at [place] of Stay Meetings, dates discussed:    Additional Comments: Pt is currently active with [facility] for [facility] services. Will need resumption orders at DC. CM will continue to follow. Lynnell Catalan, RN 11/17/2014, 3:06 PM

## 2014-11-18 DIAGNOSIS — K5900 Constipation, unspecified: Secondary | ICD-10-CM

## 2014-11-18 DIAGNOSIS — M546 Pain in thoracic spine: Secondary | ICD-10-CM | POA: Insufficient documentation

## 2014-11-18 DIAGNOSIS — R112 Nausea with vomiting, unspecified: Secondary | ICD-10-CM

## 2014-11-18 MED ORDER — HYDROXYZINE HCL 25 MG PO TABS: 25.0000 mg | ORAL_TABLET | Freq: Three times a day (TID) | ORAL | Status: DC | PRN

## 2014-11-18 MED ORDER — HYDROMORPHONE HCL 1 MG/ML IJ SOLN
0.5000 mg | INTRAMUSCULAR | Status: DC | PRN
  Administered 2014-11-18: 0.5 mg via INTRAVENOUS
  Filled 2014-11-18: qty 1

## 2014-11-18 MED ORDER — ONDANSETRON HCL 4 MG PO TABS
4.0000 mg | ORAL_TABLET | Freq: Two times a day (BID) | ORAL | Status: DC
  Administered 2014-11-18 – 2014-11-21 (×7): 4 mg via ORAL
  Filled 2014-11-18 (×12): qty 1

## 2014-11-18 MED ORDER — OXYCODONE HCL ER 10 MG PO T12A
10.0000 mg | EXTENDED_RELEASE_TABLET | Freq: Two times a day (BID) | ORAL | Status: DC
  Administered 2014-11-18 – 2014-11-21 (×6): 10 mg via ORAL
  Filled 2014-11-18 (×6): qty 1

## 2014-11-18 MED ORDER — OXYCODONE HCL 20 MG/ML PO CONC
5.0000 mg | ORAL | Status: DC | PRN
  Administered 2014-11-19 – 2014-11-20 (×5): 5 mg via SUBLINGUAL
  Administered 2014-11-21: 10 mg via SUBLINGUAL
  Administered 2014-11-22: 5 mg via SUBLINGUAL
  Filled 2014-11-18 (×8): qty 1

## 2014-11-18 MED ORDER — SENNOSIDES-DOCUSATE SODIUM 8.6-50 MG PO TABS
2.0000 | ORAL_TABLET | Freq: Two times a day (BID) | ORAL | Status: DC
  Administered 2014-11-18 – 2014-11-21 (×5): 2 via ORAL
  Filled 2014-11-18 (×11): qty 2

## 2014-11-18 MED ORDER — ONDANSETRON HCL 4 MG/2ML IJ SOLN
4.0000 mg | Freq: Three times a day (TID) | INTRAMUSCULAR | Status: DC | PRN
  Administered 2014-11-18 – 2014-11-20 (×4): 4 mg via INTRAVENOUS
  Filled 2014-11-18 (×4): qty 2

## 2014-11-18 NOTE — Progress Notes (Signed)
At 2350 pt c/o pain "absolutely out of control" in her left side and back. Visibly uncomfortable in bed. Discussed medication for pain and how we should try oxycodone first and then IV dilaudid if still out of control according to the notes written today. Pt stated she wanted the IV dilaudid now because she needed something to work "fast". IV dilaudid given per her request. Pt voices a lot of concern and anxiety over changes to pain regimen. I provided support and encouraged her to give this new regimen time to work, and that MD would review everything daily and make changes if warranted. Continue to monitor. Hortencia Conradi RN

## 2014-11-18 NOTE — Progress Notes (Signed)
[facility]   HEMATOLOGY/ONCOLOGY INPATIENT PROGRESS NOTE  Date of Service: 11/18/2014  Inpatient Attending: .Chauncey Cruel, MD   SUBJECTIVE  Patient seen in follow-up on the inpatient unit today. Notes that her pain is 5 out of 10 when she is resting in bed in the left lateral position but gets worse with movement. She has used a cumulative 5 mg of IV Dilaudid through the PCA and IV pushes by the nurses. We had extensive discussion on several pain management strategies and she is okay with trying to transition to OxyContin with Zofran premedication for basal pain control and use liquid concentrated oxycodone sublingually as needed for breakthrough pain. She will have IV Dilaudid available as a third option if the pain gets way out of control. She also notes significant constipation that is likely related to her opiates for which she is willing to try Senokot-S. For nausea she is okay with scheduling Zofran orally with additional couple doses IV if needed. She is also okay with trying hydroxyzine when necessary for additional nausea control, anxiety or itching. She is hoping that the radiation therapy might help with her pain control. We discussed that it might take several weeks to get maximum benefits of pain control. She was anxious about pain control with her pleural catheter removal. I noted that she should bring it up to the attention of the proceduralist to ensure adequate pain relief in the acute procedural setting.  She notes that the Phenergan made her more nauseous and wanted to discontinue this. She notes that her lidocaine patch worsen the pain.   OBJECTIVE:  PHYSICAL EXAMINATION: . Filed Vitals:   11/18/14 0625 11/18/14 0751 11/18/14 1123 11/18/14 1327  BP: 147/93   154/94  Pulse: 116   111  Temp: 98.7 F (37.1 C)   98.9 F (37.2 C)  TempSrc: Oral   Oral  Resp: 15 16 19 16   Height:      Weight:      SpO2: 96% 96% 92% 96%   Filed Weights   11/16/14 1600  Weight: 160 lb  (72.576 kg)   .Body mass index is 27.45 kg/(m^2).  GENERAL:alert, anxious, no overt distress. SKIN: skin color, texture, turgor are normal, no rashes or significant lesions EYES: normal, conjunctiva are pink and non-injected, sclera clear OROPHARYNX: no exudate, no erythema and lips, buccal mucosa, and tongue normal  NECK: supple, no JVD, thyroid normal size, non-tender, without nodularity LYMPH:  no palpable lymphadenopathy in the cervical, axillary or inguinal LUNGS: clear to auscultation with normal respiratory effort HEART: regular rate & rhythm,  no murmurs and no lower extremity edema ABDOMEN: abdomen soft, non-tender, normoactive bowel sounds  Musculoskeletal: no cyanosis of digits and no clubbing  PSYCH: alert & oriented x 3 with fluent speech NEURO: no focal motor/sensory deficits  MEDICAL HISTORY:  Past Medical History  Diagnosis Date  . Seizures 2010    Isolated incident.  [facility] PONV (postoperative nausea and vomiting)   . Peripheral vascular disease 02/2010    blood clot related to porta cath  . Breast cancer dx'd 2005/2011  . Bone metastases dx'd 05/2014  . S/P radiation therapy 07/17/2014 through 08/02/2014     Left mediastinum, left seventh rib 3250 cGy in 13 sessions     SURGICAL HISTORY: Past Surgical History  Procedure Laterality Date  . Breast lumpectomy  2005  . Axillary lymph node dissection  Dec. 2011  . Portacath placement  12/11  . Removal portacath    . Mediastinotomy chamberlain mcneil  Left 06/02/2013    Procedure: MEDIASTINOTOMY CHAMBERLAIN MCNEIL;  Surgeon: [name], MD;  Location: [facility];  Service: Thoracic;  Laterality: Left;  LEFT ANTERIOR MEDIASTINOTOMY     SOCIAL HISTORY: Social History   Social History  . Marital Status: Married    Spouse Name: N/A  . Number of Children: N/A  . Years of Education: N/A   Occupational History  . Not on file.    Social History Main Topics  . Smoking status: Never Smoker   . Smokeless tobacco: Never Used  . Alcohol Use: No  . Drug Use: No  . Sexual Activity: Yes   Other Topics Concern  . Not on file   Social History Narrative    FAMILY HISTORY: Family History  Problem Relation Age of Onset  . COPD Mother   . Breast cancer Sister 89    ALLERGIES:  is allergic to decadron; dilaudid; fluconazole; hydromorphone hcl; morphine and related; and tegaderm ag mesh.  MEDICATIONS:  Scheduled Meds: . enoxaparin (LOVENOX) injection  40 mg Subcutaneous Q24H  . ondansetron  4 mg Oral Q12H  . OxyCODONE  10 mg Oral Q12H  . pantoprazole (PROTONIX) IV  40 mg Intravenous QHS  . senna-docusate  2 tablet Oral BID  . sodium chloride  3 mL Intravenous Q12H   Continuous Infusions: . sodium chloride 50 mL/hr at 11/17/14 0609   PRN Meds:.HYDROmorphone (DILAUDID) injection, hydrOXYzine, ondansetron (ZOFRAN) IV, oxyCODONE, polyethylene glycol  REVIEW OF SYSTEMS:    10 Point review of Systems was done is negative except as noted above.   LABORATORY DATA:  I have reviewed the data as listed  . CBC Latest Ref Rng 11/16/2014 11/15/2014 11/14/2014  WBC 4.0 - 10.5 K/uL 8.8 8.1 8.2  Hemoglobin 12.0 - 15.0 g/dL 11.7(L) 16.8(H) 13.0  Hematocrit 36.0 - 46.0 % 33.9(L) 47.2(H) 38.0  Platelets 150 - 400 K/uL 425(H) 202 510(H)    . CMP Latest Ref Rng 11/17/2014 11/16/2014 11/15/2014  Glucose 65 - 99 mg/dL 112(H) 117(H) 104(H)  BUN 6 - 20 mg/dL 7 6 13   Creatinine 0.44 - 1.00 mg/dL 0.47 0.52 0.88  Sodium 135 - 145 mmol/L 129(L) 127(L) 141  Potassium 3.5 - 5.1 mmol/L 4.1 3.9 4.7  Chloride 101 - 111 mmol/L 96(L) 95(L) 104  CO2 22 - 32 mmol/L 26 24 30   Calcium 8.9 - 10.3 mg/dL 8.6(L) 8.8(L) 9.3  Total Protein 6.5 - 8.1 g/dL - 6.6 -  Total Bilirubin 0.3 - 1.2 mg/dL - 0.5 -  Alkaline Phos 38 - 126 U/L - 85 -  AST 15 - 41 U/L - 25 -  ALT 14 - 54 U/L - 34 -     RADIOGRAPHIC STUDIES: I have personally reviewed  the radiological images as listed and agreed with the findings in the report. Dg Chest 2 View  11/13/2014   CLINICAL DATA:  Follow-up of pleural effusion  EXAM: CHEST  2 VIEW  COMPARISON:  PA and lateral chest x-ray of November 08, 2014  FINDINGS: The right lung is adequately inflated and clear. On the left there is a persistent moderate-sized pleural effusion. The left-sided chest tube is unchanged in position with its tip projecting over the posterior aspect of the fifth rib. There is patchy alveolar opacity in the left upper lobe which is stable. The left hemidiaphragm is elevated and obscured. There is curvature of the thoracolumbar spine and S-shaped configuration. There is a nonspecific gas pattern in the upper abdomen.  IMPRESSION: Stable appearance of the  moderate-sized left pleural effusion and patchy alveolar opacity in the left upper lobe. The left chest tube is unchanged in position with its tip above the pleural effusion at the level of the posterior fifth rib.   Electronically Signed   By: David  Martinique M.D.   On: 11/13/2014 11:47   Dg Chest 2 View  11/08/2014   CLINICAL DATA:  History of breast cancer and left pleural catheter. Evaluate for a left pleural effusion. Decreased fluid drainage but fullness and pain in the left lower chest.  EXAM: CHEST  2 VIEW  COMPARISON:  10/05/2014 and 10/30/2014  FINDINGS: Stable position of the left pleural catheter. There are densities in the left upper lobe that correspond with the nodular disease seen on the PET-CT from 10/30/2014. There continues to be a pleural collection at the left lung base which has minimally changed in size. Right lung remains clear. Surgical clips in the right axilla. Heart and mediastinum are stable. Negative for a pneumothorax.  IMPRESSION: Persistent parenchymal densities in the left upper lobe which were present on the recent PET-CT. These densities are nonspecific and may be better characterized with a follow-up chest CT.  Stable  position of the left chest tube. There is persistent loculated fluid at the left lung base which has minimally changed.   Electronically Signed   By: Markus Daft M.D.   On: 11/08/2014 09:47   Nm Pet Image Restag (ps) Skull Base To Thigh  10/30/2014   CLINICAL DATA:  Subsequent treatment strategy for breast cancer.  EXAM: NUCLEAR MEDICINE PET SKULL BASE TO THIGH  TECHNIQUE: 8.0 mCi F-18 FDG was injected intravenously. Full-ring PET imaging was performed from the skull base to thigh after the radiotracer. CT data was obtained and used for attenuation correction and anatomic localization.  FASTING BLOOD GLUCOSE:  Value: 88 mg/dl  COMPARISON:  03/29/2014  FINDINGS: NECK  No hypermetabolic lymph nodes in the neck.  CHEST  Left axillary node measures 0.9 cm and has an SUV max equal to 11.05. New from previous exam. Resolution of previous hyper metabolism associated with the sub- carinal lymph node. There is a new hypermetabolic left infrahilar lymph node which has an SUV max equal to 6.8.  Left-sided chest tube is in place. The loculated left pleural effusion is stable to decreased in volume from previous exam. Again noted is extensive hypermetabolic trans pleural spread of tumor involving the left hemi thorax. Anterior paramediastinal pleural tumor measures 5 x 1.6 cm and has an SUV max equal to 4.28. On the previous exam this measured 7.3 x 3.3 cm and had an SUV max equal to 14.28.Tumor invading the ventral chest wall appears decreased from previous exam. On today's study this measures approximately 1.3 cm in thickness and has an SUV max equal to 6.24. On the previous exam this measure 2.1 cm in thickness and had an SUV max equal to 14.28. Bulky pleural tumor at the posterior medial left base is again noted involving the T11 neuro foramina and extending into the last lateral recess of the canal it this level. On today's study this tumor measures approximately 5.9 cm and has an SUV max equal to 10.5. Previously this  measured approximately 5.4 cm and had an SUV max equal to 12.9.  Patchy areas of consolidation and ground-glass attenuation are identified within the left lung and appear new from previous exam. These are nonspecific and may reflect changes of external beam radiation. Pulmonary parenchymal metastasis not excluded. Index nodule within the left  upper lobe measures 2.1 cm and has an SUV max equal to 3.7. Within the superior segment of the right lower lobe there is a new parenchymal nodule measuring 11 mm within SUV max equal to 2.9.  ABDOMEN/PELVIS  No abnormal hypermetabolic activity within the liver, pancreas, adrenal glands, or spleen. 1.6 cm periaortic lymph node is identified within SUV max equal to 11.6. Previously this lymph node measured 1.4 cm and had an SUV max equal 7.5. Adjacent lymph node measures 1.1 cm and has an SUV max of 8.4. Previously this mesh 0.9 cm and had an SUV max equal to 9.28. More caudal cluster of periaortic lymph nodes measures 2.5 cm and has an SUV max equal to 6.3 cm. Previously 1.9 cm within SUV max equal to 6.3. No enlarged or hypermetabolic pelvic or inguinal lymph nodes.  SKELETON  No focal hypermetabolic activity to suggest skeletal metastasis.  IMPRESSION: 1. Interval mixed response to therapy. Specifically, the bulky pleural based tumor along the paramediastinal left upper lobe extending into the ventral chest wall has decreased in size and degree of FDG uptake. Additionally, there is been resolution of previous hyper metabolism associated with the sub- carinal adenopathy. Persistent pleural based tumor overlying the posterior and medial left lung base is again noted. There is continued extension into the T11 neuro foramina and lateral recess of the canal. 2. New hypermetabolic left axillary and left hilar adenopathy. Additionally, persistent hypermetabolic adenopathy within the upper abdomen demonstrates mild increase in size in the interval. 3. Bilateral pulmonary nodular  opacities are identified and are nonspecific. These exhibit malignant range FDG uptake and are new from previous exam. Findings may reflect nonspecific pneumonitis and may also be seen with changes of external beam radiation. Pulmonary metastasis not excluded.   Electronically Signed   By: Kerby Moors M.D.   On: 10/30/2014 16:12   Ir Radiologist Eval & Mgmt  11/14/2014   CLINICAL DATA:  Referring physician: Lurline Del  Reason for consultation: Left PleurX  Past medical history: Pleural effusionBreast cancer of upper-inner quadrant of right female breast, Secondary malignant neoplasm of mediastinal lymph node, Abnormal LFTs (liver function tests), Chest wall pain, Post-lymphadenectomy lymphedema of arm  HISTORY OF PRESENT ILLNESS: Metastatic breast carcinoma. Large malignant left pleural effusion. PleurX catheter placed 09/01/2013 by Dr. Pascal Lux. Decreasing output since July. PET-CT 10/30/2014 demonstrated residual loculated subpulmonic effusion with extensive associated pleural metastatic disease. This has remained grossly stable on subsequent chest x-rays through 11/13/2014. The remainder of the fairly extensive effusion seen preprocedure has resolved. There is involvement of a left lower thoracic neural foramen from extension of pleural disease. She is not having any problems at the skin entry site of the PleurX catheter.  PROCEDURE: We connected the catheter up for drainage but could only get about 10 cc.  ASSESSMENT AND PLAN: My impression is that she has had extensive pleurodesis secondary to the presence of the PleurX catheter with resolution of much of the previously noted malignant left pleural effusion. The residual loculated subpulmonic fluid is associated with a moderate amount of adjacent pleural metastatic disease and and not amenable to repositioning of the catheter. In fact, this may not even significantly decompress with thoracentesis if it is loculated or inaccessible due to overlying  tumor. I discussed the findings with the patient and her spouse.  My recommendation is that we remove the catheter. She is interested in trying thoracentesis, to aspirate the residual subpulmonic effusion at the same time, in hopes of some symptomatic relief, and  I think it would be a reasonable to look with ultrasound to see if this is feasible. We can schedule this next week with Dr. Pascal Lux as she has requested.   Electronically Signed   By: Lucrezia Europe M.D.   On: 11/14/2014 16:21    ASSESSMENT & PLAN:   54 year old female from [place] with metastatic breast cancer status post multiple lines of therapy admitted with uncontrolled chest wall pain  #1 uncontrolled chest wall pain. Patient notes some sense of improvement in pain control addressed but still having discomfort with positional changes and has not been able to get out of bed much. She has used 5 mg of IV Dilaudid through the PCA and with IV pushes for acute pain management the last 24 hours. This would translate to about 50 mg of oxycodone equivalent. We discussed potential options and get a detail. She came a detailed account of her previous experience with different narcotics and her hesitation given previous nausea vomiting. We talked about the fact that most nausea vomiting tends to be temporary and one often to tolerance to it in 3-5 days of continuous use. Plan  -We will start the patient on OxyContin 10 mg twice a day with scheduled Zofran ordered 20-30 minutes prior to this. -additional when necessary Roxicodone 5-10 mg every hour when necessary sublingually for breakthrough pain. -IV Dilaudid 0.5 mg IV push only if pain unbearable with above regimen. -Patient offered low-dose dexamethasone for 2 mg daily to help with pleuritic pain and bone pain but is too hesitant to do this for fear of worsening lymphedema. -We'll adjust pain medication doses additionally based on usage. -Patient might use hydroxyzine when necessary for  nausea as well as its benefit as a pain adjuvant. -The patient gets nauseous with an OxyContin based regimen would consider trying a low-dose fentanyl patch at 12.5 g per hour instead. -Simplified some of her other medications  #2 nausea -Scheduled Zofran orally twice a day pre-OxyContin. -When necessary IV Zofran in addition if uncontrollable nausea -When necessary hydroxyzine for uncontrolled nausea/anxiety/itching  #3 opiate-induced constipation Persistent despite trying multiple enemas. Hesitant to use magnesium citrate or milk of magnesia. Plan -We'll start senna S 2 tablets twice a day and adjust based on response. -additionally magnesium citrate 1 might be considered if needed -If absolutely necessary can always consider subcutaneous methylnaltrexone as a last option.  #4 stage IV metastatic breast cancer status post multiple lines of therapy as outlined nicely in Dr. Sammuel Hines note -continue management as per Dr Curlene Labrum. -Palliative radiation therapy to the chest wall as planned.  I spent 30 minutes counseling the patient face to face. The total time spent in the appointment was 45 minutes and more than 50% was on counseling and direct patient cares.    Sullivan Lone MD [place] AAHIVMS [facility] Hematology/Oncology Physician [facility]  (Office):       [phone] (Work cell):  [phone] (Fax):           [phone]  11/18/2014 5:37 PM

## 2014-11-18 NOTE — Progress Notes (Signed)
Pt pre-medicated with zofran per order prior to giving first dose of Oxycontin 10mg .  Pt educated about plan of care with pain control.  PCA discontinued, wasted in pyxis with Raynelle Dick, RN.

## 2014-11-18 NOTE — Progress Notes (Signed)
Pt with sustained pulse 130's at rest in bed.  Pt just finished speaking with rounding MD, Dr. Irene Limbo, about plan of care.  Pt reports being in pain, but reports she just pushed PCA button.  Spoke with Dr. Irene Limbo about sustained elevated pulse, but given pt's hx pleural effusions and pain, stated we could just get a baseline EKG.  EKG shows sinus tachycardia.  MD okay for pt to remain on floor with sinus tach.  Continuing to monitor pt.

## 2014-11-19 LAB — COMPREHENSIVE METABOLIC PANEL
ALBUMIN: 2.8 g/dL — AB (ref 3.5–5.0)
ALK PHOS: 117 U/L (ref 38–126)
ALT: 90 U/L — AB (ref 14–54)
ANION GAP: 8 (ref 5–15)
AST: 50 U/L — ABNORMAL HIGH (ref 15–41)
BUN: 7 mg/dL (ref 6–20)
CALCIUM: 8.6 mg/dL — AB (ref 8.9–10.3)
CHLORIDE: 97 mmol/L — AB (ref 101–111)
CO2: 28 mmol/L (ref 22–32)
Creatinine, Ser: 0.59 mg/dL (ref 0.44–1.00)
GFR calc Af Amer: >60 mL/min (ref >60)
GFR calc non Af Amer: >60 mL/min (ref >60)
GLUCOSE: 112 mg/dL — AB (ref 65–99)
Potassium: 3.8 mmol/L (ref 3.5–5.1)
SODIUM: 133 mmol/L — AB (ref 135–145)
Total Bilirubin: 0.7 mg/dL (ref 0.3–1.2)
Total Protein: 6.2 g/dL — ABNORMAL LOW (ref 6.5–8.1)

## 2014-11-19 LAB — CBC WITH DIFFERENTIAL/PLATELET
BASOS PCT: 0 % (ref 0–1)
Basophils Absolute: 0 10*3/uL (ref 0.0–0.1)
EOS ABS: 0 10*3/uL (ref 0.0–0.7)
EOS PCT: 0 % (ref 0–5)
HCT: 35.3 % — ABNORMAL LOW (ref 36.0–46.0)
HEMOGLOBIN: 12 g/dL (ref 12.0–15.0)
Lymphocytes Relative: 5 % — ABNORMAL LOW (ref 12–46)
Lymphs Abs: 0.5 10*3/uL — ABNORMAL LOW (ref 0.7–4.0)
MCH: 31.1 pg (ref 26.0–34.0)
MCHC: 34 g/dL (ref 30.0–36.0)
MCV: 91.5 fL (ref 78.0–100.0)
Monocytes Absolute: 0.9 10*3/uL (ref 0.1–1.0)
Monocytes Relative: 10 % (ref 3–12)
NEUTROS PCT: 85 % — AB (ref 43–77)
Neutro Abs: 7.8 10*3/uL — ABNORMAL HIGH (ref 1.7–7.7)
PLATELETS: 399 10*3/uL (ref 150–400)
RBC: 3.86 MIL/uL — AB (ref 3.87–5.11)
RDW: 12.1 % (ref 11.5–15.5)
WBC: 9.2 10*3/uL (ref 4.0–10.5)

## 2014-11-19 LAB — MAGNESIUM: Magnesium: 2 mg/dL (ref 1.7–2.4)

## 2014-11-19 MED ORDER — BISACODYL 10 MG RE SUPP
10.0000 mg | Freq: Every day | RECTAL | Status: DC | PRN
  Administered 2014-11-19 – 2014-11-21 (×2): 10 mg via RECTAL
  Filled 2014-11-19 (×2): qty 1

## 2014-11-19 MED ORDER — ACETAMINOPHEN 325 MG PO TABS
650.0000 mg | ORAL_TABLET | Freq: Once | ORAL | Status: AC
  Administered 2014-11-20: 650 mg via ORAL
  Filled 2014-11-19: qty 2

## 2014-11-19 NOTE — Plan of Care (Signed)
Problem: Phase I Progression Outcomes Goal: OOB as tolerated unless otherwise ordered Outcome: Progressing Pt ambulating in the hall with family.

## 2014-11-19 NOTE — Progress Notes (Signed)
[name] [name]   HEMATOLOGY/ONCOLOGY INPATIENT PROGRESS NOTE  Date of Service: 11/19/2014  Inpatient Attending: .Chauncey Cruel, MD   SUBJECTIVE Patient was seen in follow-up this morning. Husband at bedside. Patient notes that she has been out of bed a little bit and had a shower. Did have some nausea last evening and this morning. Didn't tolerate the OxyContin though she thinks she might have thrown up the evening dose. Encouraged her to use the hydroxyzine as a backup medication for nausea in addition to Zofran. Use the sublingual oxycodone only a couple of times yesterday and certainly not as proactively as she could. He did require 1 dose of Dilaudid last night. No further IV medications used. Encouraged to ambulate and take her oxycodone sublingual 15-20 minutes prior to activities. Took.. only 1 dose of the senna and has had some bowel rumbling but no bowel movements yet. Encouraged to take 2 tablets of senna as twice a day. Also given Dulcolax suppository daily when necessary. No other acute new symptoms. Supportive counseling provided. Sinus tachycardia a little better this morning down from 130s to 140s to the low 100s.  OBJECTIVE:  PHYSICAL EXAMINATION: . Filed Vitals:   11/18/14 2052 11/19/14 0458 11/19/14 1100 11/19/14 1123  BP: 131/73 138/83 117/70 145/85  Pulse: 120 114 108 120  Temp: 100.1 F (37.8 C) 98.6 F (37 C)    TempSrc: Oral Oral    Resp: 20 20    Height:      Weight:      SpO2: 96% 95% 96% 96%   Filed Weights   11/16/14 1600  Weight: 160 lb (72.576 kg)   .Body mass index is 27.45 kg/(m^2).  GENERAL:alert, anxious, no overt distress. SKIN: skin color, texture, turgor are normal, no rashes or significant lesions EYES: normal, conjunctiva are pink and non-injected, sclera clear OROPHARYNX: no exudate, no erythema and lips, buccal mucosa, and tongue normal  NECK: supple, no JVD, thyroid normal size, non-tender, without nodularity LYMPH:  no palpable  lymphadenopathy in the cervical, axillary or inguinal LUNGS: clear to auscultation with normal respiratory effort HEART: regular rate & rhythm,  no murmurs and no lower extremity edema ABDOMEN: abdomen soft, non-tender, normoactive bowel sounds  Musculoskeletal: no cyanosis of digits and no clubbing  PSYCH: alert & oriented x 3 with fluent speech NEURO: no focal motor/sensory deficits  MEDICAL HISTORY:  Past Medical History  Diagnosis Date  . Seizures 2010    Isolated incident.  [name] [name] PONV (postoperative nausea and vomiting)   . Peripheral vascular disease 02/2010    blood clot related to porta cath  . Breast cancer dx'd 2005/2011  . Bone metastases dx'd 05/2014  . S/P radiation therapy 07/17/2014 through 08/02/2014     Left mediastinum, left seventh rib 3250 cGy in 13 sessions     SURGICAL HISTORY: Past Surgical History  Procedure Laterality Date  . Breast lumpectomy  2005  . Axillary lymph node dissection  Dec. 2011  . Portacath placement  12/11  . Removal portacath    . Mediastinotomy chamberlain mcneil Left 06/02/2013    Procedure: MEDIASTINOTOMY CHAMBERLAIN MCNEIL;  Surgeon: [name], MD;  Location: [place];  Service: Thoracic;  Laterality: Left;  LEFT ANTERIOR MEDIASTINOTOMY     SOCIAL HISTORY: Social History   Social History  . Marital Status: Married    Spouse Name: N/A  . Number of Children: N/A  . Years of Education: N/A   Occupational History  . Not on file.   Social History Main  Topics  . Smoking status: Never Smoker   . Smokeless tobacco: Never Used  . Alcohol Use: No  . Drug Use: No  . Sexual Activity: Yes   Other Topics Concern  . Not on file   Social History Narrative    FAMILY HISTORY: Family History  Problem Relation Age of Onset  . COPD Mother   . Breast cancer Sister 13    ALLERGIES:  is allergic to decadron; dilaudid; fluconazole; hydromorphone hcl;  morphine and related; and tegaderm ag mesh.  MEDICATIONS:  Scheduled Meds: . enoxaparin (LOVENOX) injection  40 mg Subcutaneous Q24H  . ondansetron  4 mg Oral Q12H  . OxyCODONE  10 mg Oral Q12H  . pantoprazole (PROTONIX) IV  40 mg Intravenous QHS  . senna-docusate  2 tablet Oral BID  . sodium chloride  3 mL Intravenous Q12H   Continuous Infusions: . sodium chloride 1,000 mL (11/19/14 1152)   PRN Meds:.bisacodyl, HYDROmorphone (DILAUDID) injection, hydrOXYzine, ondansetron (ZOFRAN) IV, oxyCODONE  REVIEW OF SYSTEMS:    10 Point review of Systems was done is negative except as noted above.  LABORATORY DATA:  I have reviewed the data as listed . CBC Latest Ref Rng 11/19/2014 11/16/2014 11/15/2014  WBC 4.0 - 10.5 K/uL 9.2 8.8 8.1  Hemoglobin 12.0 - 15.0 g/dL 12.0 11.7(L) 16.8(H)  Hematocrit 36.0 - 46.0 % 35.3(L) 33.9(L) 47.2(H)  Platelets 150 - 400 K/uL 399 425(H) 202    . CMP Latest Ref Rng 11/19/2014 11/17/2014 11/16/2014  Glucose 65 - 99 mg/dL 112(H) 112(H) 117(H)  BUN 6 - 20 mg/dL 7 7 6   Creatinine 0.44 - 1.00 mg/dL 0.59 0.47 0.52  Sodium 135 - 145 mmol/L 133(L) 129(L) 127(L)  Potassium 3.5 - 5.1 mmol/L 3.8 4.1 3.9  Chloride 101 - 111 mmol/L 97(L) 96(L) 95(L)  CO2 22 - 32 mmol/L 28 26 24   Calcium 8.9 - 10.3 mg/dL 8.6(L) 8.6(L) 8.8(L)  Total Protein 6.5 - 8.1 g/dL 6.2(L) - 6.6  Total Bilirubin 0.3 - 1.2 mg/dL 0.7 - 0.5  Alkaline Phos 38 - 126 U/L 117 - 85  AST 15 - 41 U/L 50(H) - 25  ALT 14 - 54 U/L 90(H) - 34      ASSESSMENT & PLAN:   54 year old female from [place] with metastatic breast cancer status post multiple lines of therapy admitted with uncontrolled chest wall pain  #1 uncontrolled chest wall pain. Patient notes some sense of improvement in pain control addressed but still having discomfort with positional changes and has not been able to get out of bed much. She has used 5 mg of IV Dilaudid through the PCA and with IV pushes for acute pain management the  last 24 hours. This would translate to about 50 mg of oxycodone equivalent. We discussed potential options and get a detail. She came a detailed account of her previous experience with different narcotics and her hesitation given previous nausea vomiting. We talked about the fact that most nausea vomiting tends to be temporary and one often to tolerance to it in 3-5 days of continuous use. Plan  -Continue on OxyContin 10 mg twice a day with scheduled Zofran ordered 20-30 minutes prior to this. -additional when necessary Roxicodone 5-10 mg every hour when necessary sublingually for breakthrough pain. -IV Dilaudid 0.5 mg IV push only if pain unbearable with above regimen. -Patient offered low-dose dexamethasone for 2 mg daily to help with pleuritic pain and bone pain but is too hesitant to do this for fear of worsening lymphedema. -  We'll adjust pain medication doses additionally based on usage. -Patient might use hydroxyzine when necessary for nausea as well as its benefit as a pain adjuvant. It might also help her anxiety which certainly appears to be playing into her nausea. -if the patient gets nauseous with an OxyContin based regimen would consider trying a low-dose fentanyl patch at 12.5 g per hour instead. -If all oral medications are difficult to titrate up to symptom control would need to consider a portable home Dilaudid PCA pump. -Simplified some of her other medications  #2 nausea -Scheduled Zofran orally twice a day pre-OxyContin. -When necessary IV Zofran in addition if uncontrollable nausea -When necessary hydroxyzine for uncontrolled nausea/anxiety/itching  #3 opiate-induced constipation Persistent despite trying multiple enemas. Hesitant to use magnesium citrate or milk of magnesia. Plan -continue senna S 2 tablets twice a day and adjust based on response. -additionally magnesium citrate 1 might be considered if needed -Ordered Dulcolax suppository -Discontinued MiraLAX  currently due to nausea. -Might need to consider abdominal x-ray if no bowel movements in the next 24-48 hours as well as consider manual fecal disimpaction. -If absolutely necessary can always consider subcutaneous methylnaltrexone as a last option.  #4 stage IV metastatic breast cancer status post multiple lines of therapy as outlined nicely in Dr. Sammuel Hines note -continue management as per Dr Curlene Labrum. -Palliative radiation therapy to the chest wall as planned.  I spent 20 minutes counseling the patient face to face. The total time spent in the appointment was 35 minutes and more than 50% was on counseling and direct patient cares.  Dr. Curlene Labrum continue following the patient from tomorrow   Sullivan Lone MD [place] AAHIVMS [facility] Hematology/Oncology Physician [facility]  (Office):       [phone] (Work cell):  [phone] (Fax):           [phone]  11/19/2014 1:44 PM

## 2014-11-19 NOTE — Plan of Care (Signed)
Problem: Phase II Progression Outcomes Goal: Progress activity as tolerated unless otherwise ordered Outcome: Progressing Ambulating in hall with family

## 2014-11-19 NOTE — Progress Notes (Signed)
Pt ambulating in hallway with husband and son, tolerated well.

## 2014-11-20 ENCOUNTER — Ambulatory Visit
Admit: 2014-11-20 | Discharge: 2014-11-20 | Disposition: A | Discharge status: Home / Self Care | Payer: 59 | Attending: Radiation Oncology | Admitting: Radiation Oncology

## 2014-11-20 ENCOUNTER — Ambulatory Visit: Payer: 59 | Admitting: Physical Therapy

## 2014-11-20 ENCOUNTER — Ambulatory Visit
Admission: RE | Admit: 2014-11-20 | Discharge: 2014-11-20 | Disposition: A | Discharge status: Home / Self Care | Payer: 59 | Source: Ambulatory Visit | Attending: Radiation Oncology | Admitting: Radiation Oncology

## 2014-11-20 DIAGNOSIS — C7951 Secondary malignant neoplasm of bone: Secondary | ICD-10-CM

## 2014-11-20 LAB — COMPREHENSIVE METABOLIC PANEL
ALBUMIN: 2.6 g/dL — AB (ref 3.5–5.0)
ALK PHOS: 105 U/L (ref 38–126)
ALT: 62 U/L — AB (ref 14–54)
AST: 28 U/L (ref 15–41)
Anion gap: 6 (ref 5–15)
BUN: 7 mg/dL (ref 6–20)
CALCIUM: 8.4 mg/dL — AB (ref 8.9–10.3)
CHLORIDE: 98 mmol/L — AB (ref 101–111)
CO2: 29 mmol/L (ref 22–32)
CREATININE: 0.56 mg/dL (ref 0.44–1.00)
GFR calc Af Amer: >60 mL/min (ref >60)
GFR calc non Af Amer: >60 mL/min (ref >60)
GLUCOSE: 106 mg/dL — AB (ref 65–99)
Potassium: 3.6 mmol/L (ref 3.5–5.1)
SODIUM: 133 mmol/L — AB (ref 135–145)
Total Bilirubin: 0.7 mg/dL (ref 0.3–1.2)
Total Protein: 5.6 g/dL — ABNORMAL LOW (ref 6.5–8.1)

## 2014-11-20 LAB — CBC
HCT: 32.1 % — ABNORMAL LOW (ref 36.0–46.0)
HEMOGLOBIN: 10.9 g/dL — AB (ref 12.0–15.0)
MCH: 31.4 pg (ref 26.0–34.0)
MCHC: 34 g/dL (ref 30.0–36.0)
MCV: 92.5 fL (ref 78.0–100.0)
PLATELETS: 399 10*3/uL (ref 150–400)
RBC: 3.47 MIL/uL — AB (ref 3.87–5.11)
RDW: 12.2 % (ref 11.5–15.5)
WBC: 7.4 10*3/uL (ref 4.0–10.5)

## 2014-11-20 NOTE — Progress Notes (Signed)
Ms. [name] [name] has received 2 fractions to the T11 region.  Note improved pain since Friday last week and she reports pain at a level 4/10.  PCA Dilaudid pump discontinued.  Pain managed with Oxycodone and Oxycontin.  She notes and increase in  The nodule on her left posterior scalp.

## 2014-11-20 NOTE — Progress Notes (Signed)
Pt has shaking chills, Temp-98.7 Appears weak and tired.Sister at bedside.

## 2014-11-20 NOTE — Progress Notes (Signed)
Pt states feeling woozey. Sitting up in chairf.

## 2014-11-20 NOTE — Progress Notes (Signed)
Weekly Management Note:  Site: Thoracic spine Current Dose:  700  cGy Projected Dose: 3100  cGy  Narrative: The patient is seen today for routine under treatment assessment. CBCT/MVCT images/port films were reviewed. The chart was reviewed.   She is more comfortable today ALT taking OxyContin 10 mg by mouth twice a day along with oxycodone as needed for breakthrough pain.  Her Dilaudid pump has been discontinued.  She now notices a nodule along her left posterior scalp.  She is concerned about the rapid progression of her disease.  She is taking Zofran to prevent nausea/vomiting.  She is somewhat nauseated.  Physical Examination: There were no vitals filed for this visit..  Weight:  .  On palpation his scalp there is a 3 cm raised mass along the left parietal region corresponding to the area seen on her MRI scan.  Impression: Tolerating radiation therapy well.  We will give her electron beam radiation therapy to her left posterior scalp, but she would like to wait until completion of this course of radiation therapy or until she is no longer having any nausea/vomiting.  Plan: Continue radiation therapy as planned.

## 2014-11-20 NOTE — Progress Notes (Signed)
[name] [name] is more comfortable on the current pain regimen but she is still fragile, could not make it to the BR on her own tonight, and is having significant N. No BM yet today.-- wee are continuing w the continuous + breakthrough plan as written. Doubt she will be ready for d/c before the end of the week but she is eager to and trying hard to get OOB and ambulate some

## 2014-11-21 ENCOUNTER — Ambulatory Visit
Admit: 2014-11-21 | Discharge: 2014-11-21 | Disposition: A | Discharge status: Home / Self Care | Payer: 59 | Attending: Radiation Oncology | Admitting: Radiation Oncology

## 2014-11-21 DIAGNOSIS — R110 Nausea: Secondary | ICD-10-CM

## 2014-11-21 MED ORDER — ONDANSETRON 4 MG PO TBDP
4.0000 mg | ORAL_TABLET | Freq: Three times a day (TID) | ORAL | Status: DC
  Administered 2014-11-21 – 2014-11-22 (×3): 4 mg via ORAL
  Filled 2014-11-21 (×4): qty 1

## 2014-11-21 MED ORDER — GLYCERIN (LAXATIVE) 2 G RE SUPP
1.0000 | RECTAL | Status: DC | PRN
  Filled 2014-11-21: qty 1

## 2014-11-21 MED ORDER — ONDANSETRON HCL 4 MG PO TABS: 4.0000 mg | ORAL_TABLET | Freq: Once | ORAL | Status: DC

## 2014-11-21 MED ORDER — ONDANSETRON 4 MG PO TBDP
4.0000 mg | ORAL_TABLET | Freq: Three times a day (TID) | ORAL | Status: DC
  Administered 2014-11-21 (×2): 4 mg via ORAL
  Filled 2014-11-21 (×3): qty 1

## 2014-11-21 MED ORDER — OXYCODONE HCL ER 10 MG PO T12A
10.0000 mg | EXTENDED_RELEASE_TABLET | Freq: Three times a day (TID) | ORAL | Status: DC
  Administered 2014-11-21 – 2014-11-22 (×4): 10 mg via ORAL
  Filled 2014-11-21 (×4): qty 1

## 2014-11-21 MED ORDER — ONDANSETRON HCL 4 MG PO TABS: 4.0000 mg | ORAL_TABLET | Freq: Three times a day (TID) | ORAL | Status: DC | PRN

## 2014-11-21 MED ORDER — NYSTATIN 100000 UNIT/ML MT SUSP
5.0000 mL | Freq: Four times a day (QID) | OROMUCOSAL | Status: DC
  Administered 2014-11-21 – 2014-11-22 (×3): 500000 [IU] via ORAL
  Filled 2014-11-21 (×5): qty 5

## 2014-11-21 NOTE — Progress Notes (Signed)
[name] [name]   DOB:1961-02-23   VO#:[id number]   EXH#:[id number]  Subjective: Halena.'s pain is better controlled on her current regimen but she is still waking up w pain in the middle of the night and was pretty much bedbound by yesterday evening. No BM since Sunday. Nausea a continuing problem. Tolerating XRT well.No family in room   Objective: middle aged White woman examined in bed Filed Vitals:   11/21/14 0520  BP: 133/84  Pulse: 107  Temp: 97.7 F (36.5 C)  Resp: 16    Body mass index is 27.45 kg/(m^2).  Intake/Output Summary (Last 24 hours) at 11/21/14 0817 Last data filed at 11/21/14 3810  Gross per 24 hour  Intake 2917.67 ml  Output      0 ml  Net 2917.67 ml     Sclerae unicteric  Oropharynx no thrush  No peripheral adenopathy  Lungs no rales or wheezes, auscultated anterolaterally  Heart regular rate and rhythm  Abdomen soft, +BS  Neuro nonfocal.  Breast exam: deferred  CBG (last 3)  No results for input(s): GLUCAP in the last 72 hours.   Labs:  Lab Results  Component Value Date   WBC 7.4 11/20/2014   HGB 10.9* 11/20/2014   HCT 32.1* 11/20/2014   MCV 92.5 11/20/2014   PLT 399 11/20/2014   NEUTROABS 7.8* 11/19/2014    @LASTCHEMISTRY @  Urine Studies No results for input(s): UHGB, CRYS in the last 72 hours.  Invalid input(s): UACOL, UAPR, USPG, UPH, UTP, UGL, UKET, UBIL, UNIT, UROB, [place], UEPI, UWBC, [place], [place]  Basic Metabolic Panel:  Recent Labs Lab 11/15/14 0524 11/16/14 1550 11/17/14 1044 11/19/14 0502 11/20/14 0433  NA 141 127* 129* 133* 133*  K 4.7 3.9 4.1 3.8 3.6  CL 104 95* 96* 97* 98*  CO2 30 24 26 28 29   GLUCOSE 104* 117* 112* 112* 106*  BUN 13 6 7 7 7   CREATININE 0.88 0.52 0.47 0.59 0.56  CALCIUM 9.3 8.8* 8.6* 8.6* 8.4*  MG  --  1.9  --  2.0  --    GFR Estimated Creatinine Clearance: 77.6 mL/min (by C-G formula based on Cr of 0.56). Liver Function Tests:  Recent Labs Lab 11/14/14 1328 11/16/14 1550  11/19/14 0502 11/20/14 0433  AST 33 25 50* 28  ALT 48 34 90* 62*  ALKPHOS 115 85 117 105  BILITOT 0.31 0.5 0.7 0.7  PROT 7.6 6.6 6.2* 5.6*  ALBUMIN 3.7 3.5 2.8* 2.6*   No results for input(s): LIPASE, AMYLASE in the last 168 hours. No results for input(s): AMMONIA in the last 168 hours. Coagulation profile No results for input(s): INR, PROTIME in the last 168 hours.  CBC:  Recent Labs Lab 11/14/14 1328 11/15/14 0524 11/16/14 1550 11/19/14 0502 11/20/14 0433  WBC 8.2 8.1 8.8 9.2 7.4  NEUTROABS 6.6* 4.7 7.3 7.8*  --   HGB 13.0 16.8* 11.7* 12.0 10.9*  HCT 38.0 47.2* 33.9* 35.3* 32.1*  MCV 93.2 94.2 91.4 91.5 92.5  PLT 510* 202 425* 399 399   Cardiac Enzymes: No results for input(s): CKTOTAL, CKMB, CKMBINDEX, TROPONINI in the last 168 hours. BNP: Invalid input(s): POCBNP CBG: No results for input(s): GLUCAP in the last 168 hours. D-Dimer No results for input(s): DDIMER in the last 72 hours. Hgb A1c No results for input(s): HGBA1C in the last 72 hours. Lipid Profile No results for input(s): CHOL, HDL, LDLCALC, TRIG, CHOLHDL, LDLDIRECT in the last 72 hours. Thyroid function studies No results for input(s): TSH,  T4TOTAL, T3FREE, THYROIDAB in the last 72 hours.  Invalid input(s): FREET3 Anemia work up No results for input(s): VITAMINB12, FOLATE, FERRITIN, TIBC, IRON, RETICCTPCT in the last 72 hours. Microbiology No results found for this or any previous visit (from the past 240 hour(s)).    Studies:  Dg Chest 2 View  11/13/2014   CLINICAL DATA:  Follow-up of pleural effusion  EXAM: CHEST  2 VIEW  COMPARISON:  PA and lateral chest x-ray of November 08, 2014  FINDINGS: The right lung is adequately inflated and clear. On the left there is a persistent moderate-sized pleural effusion. The left-sided chest tube is unchanged in position with its tip projecting over the posterior aspect of the fifth rib. There is patchy alveolar opacity in the left upper lobe which is stable.  The left hemidiaphragm is elevated and obscured. There is curvature of the thoracolumbar spine and S-shaped configuration. There is a nonspecific gas pattern in the upper abdomen.  IMPRESSION: Stable appearance of the moderate-sized left pleural effusion and patchy alveolar opacity in the left upper lobe. The left chest tube is unchanged in position with its tip above the pleural effusion at the level of the posterior fifth rib.   Electronically Signed   By: David  Martinique M.D.   On: 11/13/2014 11:47   Dg Chest 2 View  11/08/2014   CLINICAL DATA:  History of breast cancer and left pleural catheter. Evaluate for a left pleural effusion. Decreased fluid drainage but fullness and pain in the left lower chest.  EXAM: CHEST  2 VIEW  COMPARISON:  10/05/2014 and 10/30/2014  FINDINGS: Stable position of the left pleural catheter. There are densities in the left upper lobe that correspond with the nodular disease seen on the PET-CT from 10/30/2014. There continues to be a pleural collection at the left lung base which has minimally changed in size. Right lung remains clear. Surgical clips in the right axilla. Heart and mediastinum are stable. Negative for a pneumothorax.  IMPRESSION: Persistent parenchymal densities in the left upper lobe which were present on the recent PET-CT. These densities are nonspecific and may be better characterized with a follow-up chest CT.  Stable position of the left chest tube. There is persistent loculated fluid at the left lung base which has minimally changed.   Electronically Signed   By: Markus Daft M.D.   On: 11/08/2014 09:47   Nm Pet Image Restag (ps) Skull Base To Thigh  10/30/2014   CLINICAL DATA:  Subsequent treatment strategy for breast cancer.  EXAM: NUCLEAR MEDICINE PET SKULL BASE TO THIGH  TECHNIQUE: 8.0 mCi F-18 FDG was injected intravenously. Full-ring PET imaging was performed from the skull base to thigh after the radiotracer. CT data was obtained and used for attenuation  correction and anatomic localization.  FASTING BLOOD GLUCOSE:  Value: 88 mg/dl  COMPARISON:  03/29/2014  FINDINGS: NECK  No hypermetabolic lymph nodes in the neck.  CHEST  Left axillary node measures 0.9 cm and has an SUV max equal to 11.05. New from previous exam. Resolution of previous hyper metabolism associated with the sub- carinal lymph node. There is a new hypermetabolic left infrahilar lymph node which has an SUV max equal to 6.8.  Left-sided chest tube is in place. The loculated left pleural effusion is stable to decreased in volume from previous exam. Again noted is extensive hypermetabolic trans pleural spread of tumor involving the left hemi thorax. Anterior paramediastinal pleural tumor measures 5 x 1.6 cm and has an SUV max  equal to 4.28. On the previous exam this measured 7.3 x 3.3 cm and had an SUV max equal to 14.28.Tumor invading the ventral chest wall appears decreased from previous exam. On today's study this measures approximately 1.3 cm in thickness and has an SUV max equal to 6.24. On the previous exam this measure 2.1 cm in thickness and had an SUV max equal to 14.28. Bulky pleural tumor at the posterior medial left base is again noted involving the T11 neuro foramina and extending into the last lateral recess of the canal it this level. On today's study this tumor measures approximately 5.9 cm and has an SUV max equal to 10.5. Previously this measured approximately 5.4 cm and had an SUV max equal to 12.9.  Patchy areas of consolidation and ground-glass attenuation are identified within the left lung and appear new from previous exam. These are nonspecific and may reflect changes of external beam radiation. Pulmonary parenchymal metastasis not excluded. Index nodule within the left upper lobe measures 2.1 cm and has an SUV max equal to 3.7. Within the superior segment of the right lower lobe there is a new parenchymal nodule measuring 11 mm within SUV max equal to 2.9.  ABDOMEN/PELVIS  No  abnormal hypermetabolic activity within the liver, pancreas, adrenal glands, or spleen. 1.6 cm periaortic lymph node is identified within SUV max equal to 11.6. Previously this lymph node measured 1.4 cm and had an SUV max equal 7.5. Adjacent lymph node measures 1.1 cm and has an SUV max of 8.4. Previously this mesh 0.9 cm and had an SUV max equal to 9.28. More caudal cluster of periaortic lymph nodes measures 2.5 cm and has an SUV max equal to 6.3 cm. Previously 1.9 cm within SUV max equal to 6.3. No enlarged or hypermetabolic pelvic or inguinal lymph nodes.  SKELETON  No focal hypermetabolic activity to suggest skeletal metastasis.  IMPRESSION: 1. Interval mixed response to therapy. Specifically, the bulky pleural based tumor along the paramediastinal left upper lobe extending into the ventral chest wall has decreased in size and degree of FDG uptake. Additionally, there is been resolution of previous hyper metabolism associated with the sub- carinal adenopathy. Persistent pleural based tumor overlying the posterior and medial left lung base is again noted. There is continued extension into the T11 neuro foramina and lateral recess of the canal. 2. New hypermetabolic left axillary and left hilar adenopathy. Additionally, persistent hypermetabolic adenopathy within the upper abdomen demonstrates mild increase in size in the interval. 3. Bilateral pulmonary nodular opacities are identified and are nonspecific. These exhibit malignant range FDG uptake and are new from previous exam. Findings may reflect nonspecific pneumonitis and may also be seen with changes of external beam radiation. Pulmonary metastasis not excluded.   Electronically Signed   By: Kerby Moors M.D.   On: 10/30/2014 16:12   Ir Radiologist Eval & Mgmt  11/14/2014   CLINICAL DATA:  Referring physician: Lurline Del  Reason for consultation: Left PleurX  Past medical history: Pleural effusionBreast cancer of upper-inner quadrant of right  female breast, Secondary malignant neoplasm of mediastinal lymph node, Abnormal LFTs (liver function tests), Chest wall pain, Post-lymphadenectomy lymphedema of arm  HISTORY OF PRESENT ILLNESS: Metastatic breast carcinoma. Large malignant left pleural effusion. PleurX catheter placed 09/01/2013 by Dr. Pascal Lux. Decreasing output since July. PET-CT 10/30/2014 demonstrated residual loculated subpulmonic effusion with extensive associated pleural metastatic disease. This has remained grossly stable on subsequent chest x-rays through 11/13/2014. The remainder of the fairly extensive effusion seen  preprocedure has resolved. There is involvement of a left lower thoracic neural foramen from extension of pleural disease. She is not having any problems at the skin entry site of the PleurX catheter.  PROCEDURE: We connected the catheter up for drainage but could only get about 10 cc.  ASSESSMENT AND PLAN: My impression is that she has had extensive pleurodesis secondary to the presence of the PleurX catheter with resolution of much of the previously noted malignant left pleural effusion. The residual loculated subpulmonic fluid is associated with a moderate amount of adjacent pleural metastatic disease and and not amenable to repositioning of the catheter. In fact, this may not even significantly decompress with thoracentesis if it is loculated or inaccessible due to overlying tumor. I discussed the findings with the patient and her spouse.  My recommendation is that we remove the catheter. She is interested in trying thoracentesis, to aspirate the residual subpulmonic effusion at the same time, in hopes of some symptomatic relief, and I think it would be a reasonable to look with ultrasound to see if this is feasible. We can schedule this next week with Dr. Pascal Lux as she has requested.   Electronically Signed   By: Lucrezia Europe M.D.   On: 11/14/2014 16:21     Assessment: 54 y.o. [place] woman with stage IV breast cancer,  admitted with uncontrolled chest wall pain  (1) S/p Right lumpectomy and sentinel lymph node sampling 03/15/2004 for a pT1c pN0. Stage IA invasive ductal carcinoma, grade 2, estrogen receptor 95% positive, progesterone receptor 65% positive, HER-2 not amplified; additional surgery 04/25/2004 for seroma or clearance showed no residual tumor  (2) adjuvant chemotherapy with cyclophosphamide and doxorubicin every 21 days x4 completed 07/19/2004  (3) adjuvant radiation given under Dr. Donella Stade in [place] completed July 2006  (4) the patient opted against adjuvant antiestrogen therapy  (5) genetics testing showed no BRCA mutations  (6) biopsy of a palpable right axillary mass 10/24/2009 showed invasive ductal carcinoma, grade 3, estrogen receptor 100% positive, progesterone receptor 2% positive (alert score 5) HER-2 negative; no evidence of systemic disease on PET scanning  (7) completed 3 of 4 planned cycles of docetaxel and cyclophosphamide September 2011, fourth cycle omitted because of marked elevations in liver function tests  (8) an right axillary lymph node dissection 03/06/2010 showed 3/8 lymph nodes removed to be involved by tumor, with extracapsular extension.  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, TTS-1 negative), estrogen receptor 80% positive, progesterone receptor 2% positive, HER-2 not amplified  (12) letrozole started March 2015-- tolerated with significant side effects, discontinued at the end of May 2015  (13) PET scan 08/16/2013 shows extensive left pleural metastatic disease  and a large left pleural effusion that shifts cardiac and mediastinal structures to the right; adenopathy (celiac trunk, periadrenal, periaortic); and a left medial clavicular lesion; Status post left thoracentesis 08/16/2013 positive for adenocarcinoma, estrogen receptor positive, progesterone receptor negative.  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; everolimus was suggested as an available targeted agent  (18) exemestane started 03/31/2014, discontinued 10/31/2014 with evidence of progression (a) everolimus added 04/03/2014 but not tolerated (cytopenias, elevated LFTs) even at minimal doses; stopped 04/17/2014  (19) fulvestrant to start 11/14/2014-- postponed   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 treated with Lovenox for 5-6 months  (c) right upper extremity lymphedema  (d) hepatic steatosis with chronically elevated LFTs as well as unusual hepatic sensitivity to chemotherapy  (e) osteopenia with the lowest T score -1.6 on bone density scan 06/20/2013  (f) radiation oncology (Dr Valere Dross) has reviewed prior radiation records in case there is further mediastinal involvement with dysphagia etc in which case palliative XRT could be considered (a) radiation to left mediastinum/ left 7th rib 3250 cGy in 13 sessions04/18/2016 through 08/02/2014  (b) radiation to  painful chest area to start 11/17/2014, 2 weeks planned  (g) chest wall pain: titrating oxycontin/oxycodone  (h) constipation: on bowel prophylaxis regimen  (i) nausea: on ondansetron   Plan:i spent approximately 30 minutes with [name] today discussing pain management. She has been receiving 20 mg/day oxycontin plus 10 mg/day oxycodone. We are going to change this to 10 mg oxycontin TID and continue the PRN oxycodone as needed. We are changing the ondansetron to all oral. She understands this antiemetic frequently does not work well long term but she refuses the more usual reglan/compazine/phenergan. She continued on senokot and will add glycerin supposityry today.  With these changes I am hopeful she will become more mobile and sleep better through the night. If so we could consider d/c to home and continue XRT as outpatient.  Chauncey Cruel, MD 11/21/2014  8:17 AM Medical Oncology and Hematology [facility] [address] [place], [place] [address] Tel. [phone]    Fax. [phone]

## 2014-11-21 NOTE — Plan of Care (Signed)
Problem: Phase I Progression Outcomes Goal: Pain controlled with appropriate interventions Outcome: Progressing Patient rating pain 2-3/10 in lower back.

## 2014-11-21 NOTE — Progress Notes (Signed)
Floor nurse called tonight, reporting pt has oral thrush. I gave the order of nystatin mouth wash 4 times daily. Her primary oncologist Dr. Jana Hakim will follow her tomorrow.  Truitt Merle  MD 11/21/2014 9:33 PM

## 2014-11-22 ENCOUNTER — Other Ambulatory Visit: Payer: Self-pay | Admitting: *Deleted

## 2014-11-22 ENCOUNTER — Inpatient Hospital Stay (HOSPITAL_COMMUNITY): Admission: RE | Admit: 2014-11-22 | Payer: 59 | Source: Ambulatory Visit

## 2014-11-22 ENCOUNTER — Ambulatory Visit
Admit: 2014-11-22 | Discharge: 2014-11-22 | Disposition: A | Discharge status: Home / Self Care | Payer: 59 | Attending: Radiation Oncology | Admitting: Radiation Oncology

## 2014-11-22 ENCOUNTER — Ambulatory Visit (HOSPITAL_COMMUNITY): Admission: RE | Admit: 2014-11-22 | Payer: 59 | Source: Ambulatory Visit

## 2014-11-22 DIAGNOSIS — R52 Pain, unspecified: Secondary | ICD-10-CM

## 2014-11-22 DIAGNOSIS — K5909 Other constipation: Secondary | ICD-10-CM

## 2014-11-22 MED ORDER — PANTOPRAZOLE SODIUM 40 MG IV SOLR: 40.0000 mg | Freq: Every day | INTRAVENOUS | Status: DC

## 2014-11-22 MED ORDER — BISACODYL 10 MG RE SUPP: 10.0000 mg | Freq: Every day | RECTAL | Status: DC | PRN

## 2014-11-22 MED ORDER — HYDROXYZINE HCL 25 MG PO TABS: 25.0000 mg | ORAL_TABLET | Freq: Three times a day (TID) | ORAL | Status: DC | PRN

## 2014-11-22 MED ORDER — OXYCODONE HCL 20 MG/ML PO CONC: 5.0000 mg | ORAL | Status: DC | PRN

## 2014-11-22 MED ORDER — NYSTATIN 100000 UNIT/ML MT SUSP: 5.0000 mL | Freq: Four times a day (QID) | OROMUCOSAL | Status: DC

## 2014-11-22 MED ORDER — SENNOSIDES-DOCUSATE SODIUM 8.6-50 MG PO TABS: 2.0000 | ORAL_TABLET | Freq: Two times a day (BID) | ORAL | Status: DC

## 2014-11-22 MED ORDER — FIRST-DUKES MOUTHWASH MT SUSP: OROMUCOSAL | Status: DC

## 2014-11-22 MED ORDER — OXYCODONE HCL ER 10 MG PO T12A: 10.0000 mg | EXTENDED_RELEASE_TABLET | Freq: Three times a day (TID) | ORAL | Status: DC

## 2014-11-22 MED ORDER — MORPHINE SULFATE (CONCENTRATE) 20 MG/ML PO SOLN: ORAL | Status: DC

## 2014-11-22 MED ORDER — GLYCERIN (LAXATIVE) 2 G RE SUPP: 1.0000 | RECTAL | Status: DC | PRN

## 2014-11-22 MED ORDER — ONDANSETRON HCL 4 MG PO TABS: 4.0000 mg | ORAL_TABLET | Freq: Four times a day (QID) | ORAL | Status: DC | PRN

## 2014-11-22 MED ORDER — OXYCODONE HCL ER 10 MG PO T12A: 10.0000 mg | EXTENDED_RELEASE_TABLET | Freq: Two times a day (BID) | ORAL | Status: DC

## 2014-11-22 MED ORDER — ONDANSETRON HCL 4 MG PO TABS: 4.0000 mg | ORAL_TABLET | Freq: Once | ORAL | Status: DC

## 2014-11-22 NOTE — Progress Notes (Signed)
[place] [facility] Therapy Treatment Record Phone [phone]   Radiation Therapy was administered to [name] [name] on: 11/22/2014  2:02 PM and was treatment # 3 out of a planned course of 9 treatments.

## 2014-11-22 NOTE — Telephone Encounter (Signed)
This RN was contacted by pt's husband stating 2 issues with medications.  Pt needs Roxicodone- not Roxanol - pt is allergic to morphine.  OxyContin 10 mg ordered TID has to be pre approved per pharmacy-  This RN spoke with [employer].  To fill they would need electronic order or hard copy.  Plan per above and discussion with [name] as well as this RN contacted [facility]- new scripts will be obtained and filled at [place].  This RN gave husband new scripts for OxyContin and Roxicodone.  Note at present due to pt has been d/ced home- [name] will pick up oxycontin at the dose of 10mg  bid though pt will take it TID until override can be obtained.

## 2014-11-22 NOTE — Plan of Care (Signed)
Problem: Phase III Progression Outcomes Goal: Activity at appropriate level-compared to baseline (UP IN CHAIR FOR HEMODIALYSIS)  Outcome: Completed/Met Date Met:  11/22/14 Patient ambulates in the hallway.

## 2014-11-22 NOTE — Discharge Summary (Signed)
Physician Discharge Summary  Patient ID: [name] [name] MRN: [id number] DOB/AGE: 55/10/1960 55 y.o.  Admit date: 11/16/2014 Discharge date: 11/22/2014  Primary Care Physician:  Pcp Not In System   Discharge Diagnoses:  Uncontrolled pain, metastatic breast cancer, severe constipation secondary to narcotics,  Present on Admission:  . Pleural effusion . Breast cancer of upper-inner quadrant of right female breast . Secondary malignant neoplasm of mediastinal lymph node . Abnormal LFTs (liver function tests) . Chest wall pain . Uncontrolled pain  Discharge Medications:    Medication List    ASK your doctor about these medications        acetaminophen 325 MG tablet  Commonly known as:  TYLENOL  Take 2 tablets (650 mg total) by mouth every 12 (twelve) hours as needed for mild pain (or Fever >/= 101).     ALPRAZolam 0.5 MG tablet  Commonly known as:  XANAX  Take 1 tablet (0.5 mg total) by mouth 2 (two) times daily as needed for anxiety.     b complex vitamins capsule  Take 1 capsule by mouth daily.     cholecalciferol 1000 UNITS tablet  Commonly known as:  VITAMIN D  Take 1,000 Units by mouth daily. Takes 2,000 Iu daily     CITRACAL CALCIUM+D PO  Take by mouth. 400mg  vitamin C, 500mg  vitamin D3 2 PO DAILY     FIRST-DUKES MOUTHWASH Susp  5-10 ml qid SWISH AND SPIT     folic acid 1 MG tablet  Commonly known as:  FOLVITE  Take 1 mg by mouth daily.     ibuprofen 200 MG tablet  Commonly known as:  ADVIL,MOTRIN  Take 400 mg by mouth every 4 (four) hours as needed for moderate pain.     MAGNESIUM PO  Take 0.5 tablets by mouth daily as needed (constipation).     Melatonin 1 MG Tabs  Take 3 mg by mouth at bedtime as needed (sleep).     methocarbamol 500 MG tablet  Commonly known as:  ROBAXIN  Take 1 tablet (500 mg total) by mouth every 8 (eight) hours as needed for muscle spasms.     morphine 20 MG/ML concentrated solution  Commonly known as:  ROXANOL   5-10 MG Q 1 HOUR AS NEEDED FOR BREAKTHROUGH PAIN     ondansetron 4 MG tablet  Commonly known as:  ZOFRAN  Take 1 tablet (4 mg total) by mouth every 6 (six) hours as needed for nausea.     oxyCODONE 5 MG immediate release tablet  Commonly known as:  Oxy IR/ROXICODONE  Take 1 tablet (5 mg total) by mouth every 4 (four) hours as needed for severe pain.     OxyCODONE 10 mg T12a 12 hr tablet  Commonly known as:  OXYCONTIN  Take 1 tablet (10 mg total) by mouth every 8 (eight) hours.         Disposition and Follow-up: Discharge to home under her husband's care. Continue daily radiation treatments through 11/30/2014. Follow-up in the office with Dr. Perrin Maltese at 11/28/2014 as scheduled  Significant Diagnostic Studies:  Dg Chest 2 View  11/13/2014   CLINICAL DATA:  Follow-up of pleural effusion  EXAM: CHEST  2 VIEW  COMPARISON:  PA and lateral chest x-ray of November 08, 2014  FINDINGS: The right lung is adequately inflated and clear. On the left there is a persistent moderate-sized pleural effusion. The left-sided chest tube is unchanged in position with its tip projecting over the posterior aspect of the  fifth rib. There is patchy alveolar opacity in the left upper lobe which is stable. The left hemidiaphragm is elevated and obscured. There is curvature of the thoracolumbar spine and S-shaped configuration. There is a nonspecific gas pattern in the upper abdomen.  IMPRESSION: Stable appearance of the moderate-sized left pleural effusion and patchy alveolar opacity in the left upper lobe. The left chest tube is unchanged in position with its tip above the pleural effusion at the level of the posterior fifth rib.   Electronically Signed   By: David  Martinique M.D.   On: 11/13/2014 11:47   Dg Chest 2 View  11/08/2014   CLINICAL DATA:  History of breast cancer and left pleural catheter. Evaluate for a left pleural effusion. Decreased fluid drainage but fullness and pain in the left lower chest.  EXAM: CHEST   2 VIEW  COMPARISON:  10/05/2014 and 10/30/2014  FINDINGS: Stable position of the left pleural catheter. There are densities in the left upper lobe that correspond with the nodular disease seen on the PET-CT from 10/30/2014. There continues to be a pleural collection at the left lung base which has minimally changed in size. Right lung remains clear. Surgical clips in the right axilla. Heart and mediastinum are stable. Negative for a pneumothorax.  IMPRESSION: Persistent parenchymal densities in the left upper lobe which were present on the recent PET-CT. These densities are nonspecific and may be better characterized with a follow-up chest CT.  Stable position of the left chest tube. There is persistent loculated fluid at the left lung base which has minimally changed.   Electronically Signed   By: Markus Daft M.D.   On: 11/08/2014 09:47   Nm Pet Image Restag (ps) Skull Base To Thigh  10/30/2014   CLINICAL DATA:  Subsequent treatment strategy for breast cancer.  EXAM: NUCLEAR MEDICINE PET SKULL BASE TO THIGH  TECHNIQUE: 8.0 mCi F-18 FDG was injected intravenously. Full-ring PET imaging was performed from the skull base to thigh after the radiotracer. CT data was obtained and used for attenuation correction and anatomic localization.  FASTING BLOOD GLUCOSE:  Value: 88 mg/dl  COMPARISON:  03/29/2014  FINDINGS: NECK  No hypermetabolic lymph nodes in the neck.  CHEST  Left axillary node measures 0.9 cm and has an SUV max equal to 11.05. New from previous exam. Resolution of previous hyper metabolism associated with the sub- carinal lymph node. There is a new hypermetabolic left infrahilar lymph node which has an SUV max equal to 6.8.  Left-sided chest tube is in place. The loculated left pleural effusion is stable to decreased in volume from previous exam. Again noted is extensive hypermetabolic trans pleural spread of tumor involving the left hemi thorax. Anterior paramediastinal pleural tumor measures 5 x 1.6 cm and  has an SUV max equal to 4.28. On the previous exam this measured 7.3 x 3.3 cm and had an SUV max equal to 14.28.Tumor invading the ventral chest wall appears decreased from previous exam. On today's study this measures approximately 1.3 cm in thickness and has an SUV max equal to 6.24. On the previous exam this measure 2.1 cm in thickness and had an SUV max equal to 14.28. Bulky pleural tumor at the posterior medial left base is again noted involving the T11 neuro foramina and extending into the last lateral recess of the canal it this level. On today's study this tumor measures approximately 5.9 cm and has an SUV max equal to 10.5. Previously this measured approximately 5.4 cm and  had an SUV max equal to 12.9.  Patchy areas of consolidation and ground-glass attenuation are identified within the left lung and appear new from previous exam. These are nonspecific and may reflect changes of external beam radiation. Pulmonary parenchymal metastasis not excluded. Index nodule within the left upper lobe measures 2.1 cm and has an SUV max equal to 3.7. Within the superior segment of the right lower lobe there is a new parenchymal nodule measuring 11 mm within SUV max equal to 2.9.  ABDOMEN/PELVIS  No abnormal hypermetabolic activity within the liver, pancreas, adrenal glands, or spleen. 1.6 cm periaortic lymph node is identified within SUV max equal to 11.6. Previously this lymph node measured 1.4 cm and had an SUV max equal 7.5. Adjacent lymph node measures 1.1 cm and has an SUV max of 8.4. Previously this mesh 0.9 cm and had an SUV max equal to 9.28. More caudal cluster of periaortic lymph nodes measures 2.5 cm and has an SUV max equal to 6.3 cm. Previously 1.9 cm within SUV max equal to 6.3. No enlarged or hypermetabolic pelvic or inguinal lymph nodes.  SKELETON  No focal hypermetabolic activity to suggest skeletal metastasis.  IMPRESSION: 1. Interval mixed response to therapy. Specifically, the bulky pleural based  tumor along the paramediastinal left upper lobe extending into the ventral chest wall has decreased in size and degree of FDG uptake. Additionally, there is been resolution of previous hyper metabolism associated with the sub- carinal adenopathy. Persistent pleural based tumor overlying the posterior and medial left lung base is again noted. There is continued extension into the T11 neuro foramina and lateral recess of the canal. 2. New hypermetabolic left axillary and left hilar adenopathy. Additionally, persistent hypermetabolic adenopathy within the upper abdomen demonstrates mild increase in size in the interval. 3. Bilateral pulmonary nodular opacities are identified and are nonspecific. These exhibit malignant range FDG uptake and are new from previous exam. Findings may reflect nonspecific pneumonitis and may also be seen with changes of external beam radiation. Pulmonary metastasis not excluded.   Electronically Signed   By: Kerby Moors M.D.   On: 10/30/2014 16:12   Ir Radiologist Eval & Mgmt  11/14/2014   CLINICAL DATA:  Referring physician: Lurline Del  Reason for consultation: Left PleurX  Past medical history: Pleural effusionBreast cancer of upper-inner quadrant of right female breast, Secondary malignant neoplasm of mediastinal lymph node, Abnormal LFTs (liver function tests), Chest wall pain, Post-lymphadenectomy lymphedema of arm  HISTORY OF PRESENT ILLNESS: Metastatic breast carcinoma. Large malignant left pleural effusion. PleurX catheter placed 09/01/2013 by Dr. Pascal Lux. Decreasing output since July. PET-CT 10/30/2014 demonstrated residual loculated subpulmonic effusion with extensive associated pleural metastatic disease. This has remained grossly stable on subsequent chest x-rays through 11/13/2014. The remainder of the fairly extensive effusion seen preprocedure has resolved. There is involvement of a left lower thoracic neural foramen from extension of pleural disease. She is not  having any problems at the skin entry site of the PleurX catheter.  PROCEDURE: We connected the catheter up for drainage but could only get about 10 cc.  ASSESSMENT AND PLAN: My impression is that she has had extensive pleurodesis secondary to the presence of the PleurX catheter with resolution of much of the previously noted malignant left pleural effusion. The residual loculated subpulmonic fluid is associated with a moderate amount of adjacent pleural metastatic disease and and not amenable to repositioning of the catheter. In fact, this may not even significantly decompress with thoracentesis if it is loculated  or inaccessible due to overlying tumor. I discussed the findings with the patient and her spouse.  My recommendation is that we remove the catheter. She is interested in trying thoracentesis, to aspirate the residual subpulmonic effusion at the same time, in hopes of some symptomatic relief, and I think it would be a reasonable to look with ultrasound to see if this is feasible. We can schedule this next week with Dr. Pascal Lux as she has requested.   Electronically Signed   By: Lucrezia Europe M.D.   On: 11/14/2014 16:21    Discharge Laboratory Values: Basic Metabolic Panel:  Recent Labs Lab 11/16/14 1550 11/17/14 1044 11/19/14 0502 11/20/14 0433  NA 127* 129* 133* 133*  K 3.9 4.1 3.8 3.6  CL 95* 96* 97* 98*  CO2 24 26 28 29   GLUCOSE 117* 112* 112* 106*  BUN 6 7 7 7   CREATININE 0.52 0.47 0.59 0.56  CALCIUM 8.8* 8.6* 8.6* 8.4*  MG 1.9  --  2.0  --    GFR Estimated Creatinine Clearance: 77.6 mL/min (by C-G formula based on Cr of 0.56). Liver Function Tests:  Recent Labs Lab 11/16/14 1550 11/19/14 0502 11/20/14 0433  AST 25 50* 28  ALT 34 90* 62*  ALKPHOS 85 117 105  BILITOT 0.5 0.7 0.7  PROT 6.6 6.2* 5.6*  ALBUMIN 3.5 2.8* 2.6*   No results for input(s): LIPASE, AMYLASE in the last 168 hours. No results for input(s): AMMONIA in the last 168 hours. Coagulation profile No  results for input(s): INR, PROTIME in the last 168 hours.  CBC:  Recent Labs Lab 11/16/14 1550 11/19/14 0502 11/20/14 0433  WBC 8.8 9.2 7.4  NEUTROABS 7.3 7.8*  --   HGB 11.7* 12.0 10.9*  HCT 33.9* 35.3* 32.1*  MCV 91.4 91.5 92.5  PLT 425* 399 399   Cardiac Enzymes: No results for input(s): CKTOTAL, CKMB, CKMBINDEX, TROPONINI in the last 168 hours. BNP: Invalid input(s): POCBNP CBG: No results for input(s): GLUCAP in the last 168 hours. D-Dimer No results for input(s): DDIMER in the last 72 hours. Hgb A1c No results for input(s): HGBA1C in the last 72 hours. Lipid Profile No results for input(s): CHOL, HDL, LDLCALC, TRIG, CHOLHDL, LDLDIRECT in the last 72 hours. Thyroid function studies No results for input(s): TSH, T4TOTAL, T3FREE, THYROIDAB in the last 72 hours.  Invalid input(s): FREET3 Anemia work up No results for input(s): VITAMINB12, FOLATE, FERRITIN, TIBC, IRON, RETICCTPCT in the last 72 hours. Sepsis Labs Invalid input(s): PROCALCITONIN,  WBC,  LACTICIDVEN Microbiology No results found for this or any previous visit (from the past 240 hour(s)).   Brief H and P: For complete details please refer to admission H and P, but in brief, the patient was admitted with uncontrolled pain 11/16/2014. Radiation oncology was consulted and radiation was started while she was in the hospital. For full hospital course even noted below  Physical Exam at Discharge: BP 138/85 mmHg  Pulse 100  Temp(Src) 98.1 F (36.7 C) (Oral)  Resp 18  Ht 5\' 4"  (1.626 m)  Wt 160 lb (72.576 kg)  BMI 27.45 kg/m2  SpO2 99% Gen: Middle-aged white woman who appears stated age Cardiovascular: Regular rate and rhythm Respiratory: Dullness to percussion at left base. Normal breath sounds on the right Gastrointestinal:Soft, nontender, positive bowel sounds Extremities: No joint edema    Hospital Course:  Active Problems:   Pleural effusion   Breast cancer of upper-inner quadrant of right  female breast   Secondary malignant neoplasm of  mediastinal lymph node   Abnormal LFTs (liver function tests)   Chest wall pain   Post-lymphadenectomy lymphedema of arm   Uncontrolled pain   Nausea with vomiting   Constipation   Left-sided thoracic back pain  the patient was admitted with uncontrolled pain and started initially on intravenous hydromorphone. She also received antianxiety agents. Over the next 24 hours the patient's pain became sufficiently well-controlled that she could be transitioned to oral agents and she was initially on OxyContin 10 mg twice daily, switched to 10 mg 3 times daily as of 11/21/2014. She uses Roxicet for breakthrough pain. On this combination she has become ambulatory and feels she has good control of her pain.  Unfortunately she has been severely constipated because of the narcotics. She is on an aggressive bowel prophylaxis regimen which she will continue at home.  She has received radiation treatments during the hospitalization and these will be continued on a daily basis as outpatient.  Diet:  Regular  Activity:  As tolerated  Condition at Discharge:   Improved  Signed: Dr. Lurline Del [phone]  11/22/2014, 2:01 PM

## 2014-11-22 NOTE — Progress Notes (Signed)
Patient d/c home with husband,in good spirits. Pain is controlled. Has a stable condition on d/c. D/c instructions given earlier,teach utilized, verbalized understanding.

## 2014-11-23 ENCOUNTER — Telehealth: Payer: Self-pay | Admitting: *Deleted

## 2014-11-23 ENCOUNTER — Other Ambulatory Visit: Payer: Self-pay | Admitting: *Deleted

## 2014-11-23 ENCOUNTER — Ambulatory Visit
Admit: 2014-11-23 | Discharge: 2014-11-23 | Disposition: A | Discharge status: Home / Self Care | Payer: 59 | Attending: Radiation Oncology | Admitting: Radiation Oncology

## 2014-11-23 DIAGNOSIS — C50911 Malignant neoplasm of unspecified site of right female breast: Secondary | ICD-10-CM | POA: Diagnosis not present

## 2014-11-23 DIAGNOSIS — G629 Polyneuropathy, unspecified: Secondary | ICD-10-CM | POA: Diagnosis not present

## 2014-11-23 DIAGNOSIS — M549 Dorsalgia, unspecified: Secondary | ICD-10-CM | POA: Diagnosis not present

## 2014-11-23 DIAGNOSIS — C7951 Secondary malignant neoplasm of bone: Secondary | ICD-10-CM | POA: Diagnosis not present

## 2014-11-23 DIAGNOSIS — R591 Generalized enlarged lymph nodes: Secondary | ICD-10-CM | POA: Diagnosis not present

## 2014-11-23 DIAGNOSIS — Z510 Encounter for antineoplastic radiation therapy: Secondary | ICD-10-CM | POA: Diagnosis not present

## 2014-11-23 NOTE — Telephone Encounter (Signed)
Shaunna inquired with this RN " what to do about the drain ?"  Per recent inpt status for uncontrolled pain- Mychele cancelled IR removal of pleurX catheter.  Meggin states she feels " a fullness in her mid left back that comes around to her chest."  Fullness is not " pain " " but just a feeling and a tightness ".  Marcelia denies fevers and states no compromise in her breathing.  She states pain is currently well controlled and she was able to rest pm 8/25 " woke a few times but overall good ".  Sherlin is ambulatory this am at the [facility]  Per MD review- pt should not have catheter drain ( do not want to set off any chest spasms that could affect pain level ).  Discussed above with [name] who verbalized understanding.

## 2014-11-23 NOTE — Telephone Encounter (Signed)
This RN contacted [employer] benefits to obtain over ride for use of OxyContin 10 mg for TID dosing.  Per review with Hilda Blades of clinical data and review of questions- authorization has been provided for TID dosing with quantity of 90 tabs Q 30 days.  Ondansetron  as well can be increased in quantity.  Due to current fill pharmacy would need to call the help desk .  [employer] help line is [phone].

## 2014-11-24 ENCOUNTER — Ambulatory Visit (HOSPITAL_BASED_OUTPATIENT_CLINIC_OR_DEPARTMENT_OTHER): Payer: 59 | Admitting: Oncology

## 2014-11-24 ENCOUNTER — Telehealth: Payer: Self-pay | Admitting: *Deleted

## 2014-11-24 ENCOUNTER — Ambulatory Visit (HOSPITAL_COMMUNITY)
Admission: RE | Admit: 2014-11-24 | Discharge: 2014-11-24 | Disposition: A | Discharge status: Home / Self Care | Payer: 59 | Source: Ambulatory Visit | Attending: Oncology | Admitting: Oncology

## 2014-11-24 ENCOUNTER — Ambulatory Visit
Admit: 2014-11-24 | Discharge: 2014-11-24 | Disposition: A | Discharge status: Home / Self Care | Payer: 59 | Attending: Radiation Oncology | Admitting: Radiation Oncology

## 2014-11-24 ENCOUNTER — Other Ambulatory Visit: Payer: Self-pay | Admitting: *Deleted

## 2014-11-24 ENCOUNTER — Ambulatory Visit: Payer: 59 | Admitting: Physical Therapy

## 2014-11-24 DIAGNOSIS — M6289 Other specified disorders of muscle: Secondary | ICD-10-CM

## 2014-11-24 DIAGNOSIS — M545 Low back pain: Secondary | ICD-10-CM | POA: Diagnosis present

## 2014-11-24 DIAGNOSIS — K5900 Constipation, unspecified: Secondary | ICD-10-CM | POA: Diagnosis present

## 2014-11-24 DIAGNOSIS — C50211 Malignant neoplasm of upper-inner quadrant of right female breast: Secondary | ICD-10-CM

## 2014-11-24 DIAGNOSIS — Z853 Personal history of malignant neoplasm of breast: Secondary | ICD-10-CM | POA: Diagnosis not present

## 2014-11-24 DIAGNOSIS — M549 Dorsalgia, unspecified: Secondary | ICD-10-CM | POA: Diagnosis present

## 2014-11-24 DIAGNOSIS — G729 Myopathy, unspecified: Secondary | ICD-10-CM | POA: Diagnosis present

## 2014-11-24 DIAGNOSIS — C771 Secondary and unspecified malignant neoplasm of intrathoracic lymph nodes: Secondary | ICD-10-CM

## 2014-11-24 DIAGNOSIS — K5909 Other constipation: Secondary | ICD-10-CM

## 2014-11-24 DIAGNOSIS — R0789 Other chest pain: Secondary | ICD-10-CM | POA: Diagnosis present

## 2014-11-24 DIAGNOSIS — I890 Lymphedema, not elsewhere classified: Secondary | ICD-10-CM | POA: Diagnosis present

## 2014-11-24 DIAGNOSIS — Z510 Encounter for antineoplastic radiation therapy: Secondary | ICD-10-CM | POA: Diagnosis not present

## 2014-11-24 NOTE — Telephone Encounter (Signed)
Received call from pt's husband and patient stating she " took the mag citrate about an hour ago but has vomited it all back ".  Pt needs to take pain medications as well .  Plan per call is pt will treat nausea- then once gone will take her pain medications and then wait 1 hour to restart bowel prophylaxis-  She will obtain 100% apple juice ( not concentrate ) -  She will take a dose of miralax and then drink the apple juice. She will maintain a liquid diet for the next 24 hours-  [name] and Donnielle verbalized understanding.

## 2014-11-24 NOTE — Progress Notes (Signed)
Pt spoke to this RN in lobby prior to rad onc appointment stating concern and discomfort due to " I haven't had a BM since Tuesday ".  Per quick discussion per above- pt will obtain a KUB post radiation and then come to speak with this RN for further recommendations.

## 2014-11-24 NOTE — Progress Notes (Signed)
[name] [name] stopped in today for an unscheduled visit. She was discharged 2 days ago. Her pain regimen, which is complex, is generally working well. However she is severely constipated.  She has not been taking her bowel prophylaxis as prescribed. Today I suggested she take Senokot as 2 tablets twice daily and MiraLAX every day. Just to make sure that she did not have obstruction we went ahead and obtained a KUB which shows quite a bit of stool to be present but no evidence of obstruction.  Accordingly I recommended she take a half a bottle of magnesium citrate this morning and if that does not work after 2 hours take the remaining half bottle.  I think if she keeps to the regimen as described she will have soft bowel movements every couple of days and she will not need to use harsh laxatives.  As far as the pain is concerned she wanted to drop the mid day OxyContin thinking that that might help her have bowel movements. I reassured her that it would make no difference to her bowel movements because she would be on other narcotics but that it might again cause her to be in uncontrolled pain which took her to the hospital twice. The plan then is going to be to continue OxyContin 10 mg 3 times a day as previously planned, with Roxicodonel for breakthrough pain.  She knows to call for any other problems that may develop before her next visit here

## 2014-11-24 NOTE — Therapy (Signed)
[place], [place], [address] Phone: [phone]   Fax:  [phone]  Physical Therapy Treatment  Patient Details  Name: [name] [name] MRN: [id number] Date of Birth: [birth year] Referring Provider:  Chauncey Cruel, MD  Encounter Date: 11/24/2014      PT End of Session - 11/24/14 1211    Visit Number [id number]   Number of Visits 58   Date for PT Re-Evaluation 02/09/15   PT Start Time 1034  Pt. late due to x-ray today   PT Stop Time 1105   PT Time Calculation (min) 31 min   Activity Tolerance Patient tolerated treatment well   Behavior During Therapy [facility] for tasks assessed/performed      Past Medical History  Diagnosis Date  . Seizures 2010    Isolated incident.  Marland Kitchen PONV (postoperative nausea and vomiting)   . Peripheral vascular disease 02/2010    blood clot related to porta cath  . Breast cancer dx'd 2005/2011  . Bone metastases dx'd 05/2014  . S/P radiation therapy 07/17/2014 through 08/02/2014     Left mediastinum, left seventh rib 3250 cGy in 13 sessions     Past Surgical History  Procedure Laterality Date  . Breast lumpectomy  2005  . Axillary lymph node dissection  Dec. 2011  . Portacath placement  12/11  . Removal portacath    . Mediastinotomy chamberlain mcneil Left 06/02/2013    Procedure: MEDIASTINOTOMY CHAMBERLAIN MCNEIL;  Surgeon: [name], MD;  Location: [facility];  Service: Thoracic;  Laterality: Left;  LEFT ANTERIOR MEDIASTINOTOMY     There were no vitals filed for this visit.  Visit Diagnosis:  Lymphedema  Muscle stiffness  Other chest pain  Acute back pain      Subjective Assessment - 11/24/14 1034    Currently in Pain? Yes   Pain Score 4    Pain Location Axilla  and left ribs   Pain Orientation Right   Pain Descriptors / Indicators Other (Comment)  full                          [facility] Adult PT Treatment/Exercise - 11/24/14 0001    Manual Therapy   Manual Lymphatic Drainage (MLD) In left sidelying, posterior interaxillary anastomosis and right axillo-inguinal anastomosis; in supine, short neck, left axilla and anterior interaxillary anastomosis, right groin and axillo-inguinal anastomosis, area between right breast scars, directing toward pathways, and right upper arm.     Manual Therapy Other (comment)  In Rt. sidelying, soft tissue work to left back, neck                         Long Term Clinic Goals - 11/10/14 1256    CC Long Term Goal  #1   Title Reports left upper back and left side pain is controlled at 4/10 or less with therapy at the current frequency.   Status On-going   CC Long Term Goal  #2   Title Pt. will report swelling is adequately managed to enable ADL function at a consistent level.   Status On-going            Plan - 11/24/14 1212    Clinical Impression Statement Patient with recent hospitalization for pain, now with pain from constipation that results from medication she is taking; currently undergoing radiation for the T11 foramen metastasis.   Pt will benefit from skilled therapeutic intervention  in order to improve on the following deficits Increased edema;Pain;Increased fascial restricitons   Rehab Potential Fair   Clinical Impairments Affecting Rehab Potential active cancer   PT Frequency 2x / week   PT Duration 12 weeks   PT Treatment/Interventions Manual lymph drainage;Manual techniques   PT Next Visit Plan manual lymph drainage, soft tissue work; trial of electrical stimulation for pain relief   Consulted and Agree with Plan of Care Patient        Problem List Patient Active Problem List   Diagnosis Date Noted  . Nausea with vomiting 11/18/2014  . Constipation 11/18/2014  . Left-sided thoracic back pain   . Bone metastases 11/16/2014  . Back pain 11/15/2014  .  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 Uncontrolled pain 11/14/2014  . 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 Chest wall pain 03/21/2014  . Abnormal LFTs (liver function tests) 09/12/2013  . Pleural effusion 08/18/2013  . 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 Secondary malignant neoplasm of mediastinal lymph node 08/18/2013    Corsica Franson 11/24/2014, 12:16 PM  [place], [place], [address] Phone: [phone]   Fax:  [place], PT 11/24/2014 12:16 PM

## 2014-11-27 ENCOUNTER — Other Ambulatory Visit: Payer: 59

## 2014-11-27 ENCOUNTER — Ambulatory Visit
Admit: 2014-11-27 | Discharge: 2014-11-27 | Disposition: A | Discharge status: Home / Self Care | Payer: 59 | Attending: Radiation Oncology | Admitting: Radiation Oncology

## 2014-11-27 ENCOUNTER — Ambulatory Visit: Payer: 59 | Admitting: Physical Therapy

## 2014-11-27 ENCOUNTER — Encounter: Payer: Self-pay | Admitting: Radiation Oncology

## 2014-11-27 ENCOUNTER — Ambulatory Visit
Admission: RE | Admit: 2014-11-27 | Discharge: 2014-11-27 | Disposition: A | Discharge status: Home / Self Care | Payer: 59 | Source: Ambulatory Visit | Attending: Radiation Oncology | Admitting: Radiation Oncology

## 2014-11-27 ENCOUNTER — Other Ambulatory Visit (HOSPITAL_BASED_OUTPATIENT_CLINIC_OR_DEPARTMENT_OTHER): Payer: 59

## 2014-11-27 VITALS — BP 118/81 | HR 102 | Temp 98.0°F | Resp 16 | Ht 64.0 in

## 2014-11-27 DIAGNOSIS — M6289 Other specified disorders of muscle: Secondary | ICD-10-CM

## 2014-11-27 DIAGNOSIS — K5900 Constipation, unspecified: Secondary | ICD-10-CM

## 2014-11-27 DIAGNOSIS — I890 Lymphedema, not elsewhere classified: Secondary | ICD-10-CM

## 2014-11-27 DIAGNOSIS — C50211 Malignant neoplasm of upper-inner quadrant of right female breast: Secondary | ICD-10-CM

## 2014-11-27 DIAGNOSIS — C771 Secondary and unspecified malignant neoplasm of intrathoracic lymph nodes: Secondary | ICD-10-CM | POA: Diagnosis not present

## 2014-11-27 DIAGNOSIS — C7951 Secondary malignant neoplasm of bone: Secondary | ICD-10-CM

## 2014-11-27 DIAGNOSIS — Z510 Encounter for antineoplastic radiation therapy: Secondary | ICD-10-CM | POA: Diagnosis not present

## 2014-11-27 DIAGNOSIS — R0789 Other chest pain: Secondary | ICD-10-CM

## 2014-11-27 LAB — CBC WITH DIFFERENTIAL/PLATELET
BASO%: 0.4 % (ref 0.0–2.0)
Basophils Absolute: 0 10*3/uL (ref 0.0–0.1)
EOS%: 1.1 % (ref 0.0–7.0)
Eosinophils Absolute: 0.1 10*3/uL (ref 0.0–0.5)
HCT: 37.5 % (ref 34.8–46.6)
HGB: 12.5 g/dL (ref 11.6–15.9)
LYMPH%: 2.4 % — AB (ref 14.0–49.7)
MCH: 31 pg (ref 25.1–34.0)
MCHC: 33.3 g/dL (ref 31.5–36.0)
MCV: 93.2 fL (ref 79.5–101.0)
MONO#: 0.7 10*3/uL (ref 0.1–0.9)
MONO%: 9.2 % (ref 0.0–14.0)
NEUT%: 86.9 % — AB (ref 38.4–76.8)
NEUTROS ABS: 6.3 10*3/uL (ref 1.5–6.5)
Platelets: 751 10*3/uL — ABNORMAL HIGH (ref 145–400)
RBC: 4.02 10*6/uL (ref 3.70–5.45)
RDW: 12.8 % (ref 11.2–14.5)
WBC: 7.3 10*3/uL (ref 3.9–10.3)
lymph#: 0.2 10*3/uL — ABNORMAL LOW (ref 0.9–3.3)

## 2014-11-27 LAB — COMPREHENSIVE METABOLIC PANEL (CC13)
ALT: 76 U/L — AB (ref 0–55)
ANION GAP: 9 meq/L (ref 3–11)
AST: 37 U/L — ABNORMAL HIGH (ref 5–34)
Albumin: 3.1 g/dL — ABNORMAL LOW (ref 3.5–5.0)
Alkaline Phosphatase: 181 U/L — ABNORMAL HIGH (ref 40–150)
BILIRUBIN TOTAL: 0.31 mg/dL (ref 0.20–1.20)
BUN: 8.1 mg/dL (ref 7.0–26.0)
CHLORIDE: 98 meq/L (ref 98–109)
CO2: 28 meq/L (ref 22–29)
CREATININE: 0.8 mg/dL (ref 0.6–1.1)
Calcium: 9.8 mg/dL (ref 8.4–10.4)
EGFR: 82 mL/min/{1.73_m2} — AB (ref >90)
GLUCOSE: 65 mg/dL — AB (ref 70–140)
Potassium: 3.8 mEq/L (ref 3.5–5.1)
SODIUM: 136 meq/L (ref 136–145)
TOTAL PROTEIN: 7.1 g/dL (ref 6.4–8.3)

## 2014-11-27 NOTE — Therapy (Signed)
[place], [place], [address] Phone: [phone]   Fax:  [phone]  Physical Therapy Treatment  Patient Details  Name: [name] [name] MRN: [id number] Date of Birth: [birth year] Referring Provider:  Chauncey Cruel, MD  Encounter Date: 11/27/2014      PT End of Session - 11/27/14 1209    Visit Number 70   Number of Visits 88   Date for PT Re-Evaluation 02/09/15   PT Start Time 1020   PT Stop Time 1103   PT Time Calculation (min) 43 min   Activity Tolerance Patient tolerated treatment well   Behavior During Therapy [facility] for tasks assessed/performed      Past Medical History  Diagnosis Date  . Seizures 2010    Isolated incident.  Marland Kitchen PONV (postoperative nausea and vomiting)   . Peripheral vascular disease 02/2010    blood clot related to porta cath  . Breast cancer dx'd 2005/2011  . Bone metastases dx'd 05/2014  . S/P radiation therapy 07/17/2014 through 08/02/2014     Left mediastinum, left seventh rib 3250 cGy in 13 sessions     Past Surgical History  Procedure Laterality Date  . Breast lumpectomy  2005  . Axillary lymph node dissection  Dec. 2011  . Portacath placement  12/11  . Removal portacath    . Mediastinotomy chamberlain mcneil Left 06/02/2013    Procedure: MEDIASTINOTOMY CHAMBERLAIN MCNEIL;  Surgeon: [name], MD;  Location: [facility];  Service: Thoracic;  Laterality: Left;  LEFT ANTERIOR MEDIASTINOTOMY     There were no vitals filed for this visit.  Visit Diagnosis:  Lymphedema  Muscle stiffness  Other chest pain      Subjective Assessment - 11/27/14 1020    Subjective Cut back one dose a day of the oxycontin.   Currently in Pain? Yes   Pain Score 4   Left flank 1-2/10; back 0/10   Pain Location Axilla   Pain Orientation Right                         [facility]  Adult PT Treatment/Exercise - 11/27/14 0001    Manual Therapy   Manual Lymphatic Drainage (MLD) In left sidelying, posterior interaxillary anastomosis and right axillo-inguinal anastomosis; in supine, short neck, left axilla and anterior interaxillary anastomosis, right groin and axillo-inguinal anastomosis, area between right breast scars, directing toward pathways.  In right sidelying, left periscapular area toward left groin.   Manual Therapy Other (comment)  In Rt. sidelying, soft tissue work to left back, neck                         Long Term Clinic Goals - 11/10/14 1256    CC Long Term Goal  #1   Title Reports left upper back and left side pain is controlled at 4/10 or less with therapy at the current frequency.   Status On-going   CC Long Term Goal  #2   Title Pt. will report swelling is adequately managed to enable ADL function at a consistent level.   Status On-going            Plan - 11/27/14 1210    Clinical Impression Statement Doing better in terms of pain and discomfort, and getting her medications into a routine.   Pt will benefit from skilled therapeutic intervention in order to improve on the following deficits Increased edema;Pain;Increased fascial restricitons  Rehab Potential Good   Clinical Impairments Affecting Rehab Potential active cancer   PT Frequency 2x / week   PT Duration 12 weeks   PT Treatment/Interventions Manual lymph drainage;Manual techniques   PT Next Visit Plan manual lymph drainage, soft tissue work; trial of electrical stimulation for pain relief if needed   Consulted and Agree with Plan of Care Patient   PT Plan Continue manual lymph drainage, soft tissue work.        Problem List Patient Active Problem List   Diagnosis Date Noted  . Nausea with vomiting 11/18/2014  . Constipation 11/18/2014  . Left-sided thoracic back pain   . Bone metastases 11/16/2014  . Back pain 11/15/2014  . 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 Uncontrolled pain 11/14/2014  . 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 Chest wall pain 03/21/2014  . Abnormal LFTs (liver function tests) 09/12/2013  . Pleural effusion 08/18/2013  . 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

## 2014-11-27 NOTE — Progress Notes (Signed)
Weekly Management Note:  Site: lower thoracic spine/left  Paravertebral region Current Dose:   2200  cGy Projected Dose:  3100  cGy  Narrative: The patient is seen today for routine under treatment assessment. CBCT/MVCT images/port films were reviewed. The chart was reviewed.    She states that her left paravertebral lower thoracic spine discomfort is improved, and she is prepared to stop her OxyContin. She will continue with oxycodone when necessary. She is pleased with her progress.  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  Physical Examination:  Filed Vitals:   11/27/14 0929  BP: 118/81  Pulse: 102  Temp: 98 F (36.7 C)  Resp: 16  .  Weight:  .  There are no significant skin changes along her back or abdomen. Her left parietal bone metastasis remains palpable.  Impression: Tolerating radiation therapy well.  She will finish her radiation therapy this Thursday. We'll have her visit [place] for CT simulation for her calvarial metastasis next Tuesday.  Plan: Continue radiation therapy as planned.

## 2014-11-27 NOTE — Progress Notes (Signed)
[name] [name] had completed 7 fractions to her T spine.  She reports pain at a 2-3/10 in her underarm areas due to lymphedema.  She is taking OxyContin twice a day.  She had a bowel movement last night and is using prunes and apple juice. She denies trouble swallowing or a sore throat.  She denies skin irritation.  She refused to be weighed today.  She reports fatigue.  BP 118/81 mmHg  Pulse 102  Temp(Src) 98 F (36.7 C) (Oral)  Resp 16  Ht 5\' 4"  (1.626 m)  Wt

## 2014-11-28 ENCOUNTER — Other Ambulatory Visit: Payer: Self-pay | Admitting: *Deleted

## 2014-11-28 ENCOUNTER — Ambulatory Visit
Admission: RE | Admit: 2014-11-28 | Discharge: 2014-11-28 | Disposition: A | Discharge status: Home / Self Care | Payer: 59 | Source: Ambulatory Visit | Attending: Radiation Oncology | Admitting: Radiation Oncology

## 2014-11-28 ENCOUNTER — Telehealth: Payer: Self-pay | Admitting: Oncology

## 2014-11-28 ENCOUNTER — Ambulatory Visit: Admit: 2014-11-28 | Payer: 59

## 2014-11-28 ENCOUNTER — Encounter: Payer: Self-pay | Admitting: Radiation Oncology

## 2014-11-28 ENCOUNTER — Ambulatory Visit: Payer: 59

## 2014-11-28 ENCOUNTER — Ambulatory Visit (HOSPITAL_BASED_OUTPATIENT_CLINIC_OR_DEPARTMENT_OTHER): Payer: 59 | Admitting: Oncology

## 2014-11-28 ENCOUNTER — Encounter: Payer: Self-pay | Admitting: *Deleted

## 2014-11-28 VITALS — BP 135/84 | HR 100 | Resp 18 | Ht 64.0 in | Wt 164.6 lb

## 2014-11-28 DIAGNOSIS — C50211 Malignant neoplasm of upper-inner quadrant of right female breast: Secondary | ICD-10-CM | POA: Diagnosis not present

## 2014-11-28 DIAGNOSIS — K5900 Constipation, unspecified: Secondary | ICD-10-CM | POA: Diagnosis not present

## 2014-11-28 DIAGNOSIS — R52 Pain, unspecified: Secondary | ICD-10-CM

## 2014-11-28 DIAGNOSIS — G893 Neoplasm related pain (acute) (chronic): Secondary | ICD-10-CM | POA: Diagnosis not present

## 2014-11-28 DIAGNOSIS — R110 Nausea: Secondary | ICD-10-CM | POA: Diagnosis not present

## 2014-11-28 DIAGNOSIS — R945 Abnormal results of liver function studies: Secondary | ICD-10-CM

## 2014-11-28 DIAGNOSIS — C7951 Secondary malignant neoplasm of bone: Secondary | ICD-10-CM

## 2014-11-28 DIAGNOSIS — R530 Neoplastic (malignant) related fatigue: Secondary | ICD-10-CM

## 2014-11-28 DIAGNOSIS — R7989 Other specified abnormal findings of blood chemistry: Secondary | ICD-10-CM

## 2014-11-28 MED ORDER — NYSTATIN 100000 UNIT/ML MT SUSP: 5.0000 mL | Freq: Four times a day (QID) | OROMUCOSAL | Status: DC

## 2014-11-28 MED ORDER — ONDANSETRON HCL 4 MG PO TABS: 4.0000 mg | ORAL_TABLET | Freq: Four times a day (QID) | ORAL | Status: DC | PRN

## 2014-11-28 NOTE — Progress Notes (Addendum)
Ms. [name] [name] seen today due to increasing nausea and epigastric pain. She was examined by Dr. Valere Dross today.  She has also requested that her simulation to her brain be post-poned until next week and is now scheduled for 12/07/14.

## 2014-11-28 NOTE — Progress Notes (Signed)
Weekly Management Note:  Site: Lower thoracic spine/left paravertebral region Current Dose:  2200  cGy Projected Dose: 3100  cGy  Narrative: The patient is seen today for routine under treatment assessment. CBCT/MVCT images/port films were reviewed. The chart was reviewed.   She seen today prior to her scheduled treatment for further review.  Yesterday afternoon she became nauseated soon after taking OxyContin.  She has been taking Zofran around-the-clock since then.  She remains nauseated and is concerned that radiation therapy may be contributing to her symptomatology.  Physical Examination: There were no vitals filed for this visit..  Weight:  .  No change.  Impression: Possible nausea from narcotics but also radiation therapy which traverses the stomach.  We agree that she should take a treatment break and hopefully resume her last 3 fractions of radiation therapy next Tuesday.  Later next week she will have CT simulation for treatment of her left parietal calvarial metastasis.  Plan: Continue radiation therapy as planned.

## 2014-11-28 NOTE — Addendum Note (Signed)
Addended by: Chauncey Cruel on: 11/28/2014 09:14 AM   Modules accepted: Orders, Medications

## 2014-11-28 NOTE — Progress Notes (Signed)
See dictated note from earlier today. 

## 2014-11-28 NOTE — Telephone Encounter (Signed)
Gave patient avs report and appointments for September. Next lab should be 9/20 and f/u w/GM an inj 9/21. Dates 8/30 on pof are past dates.

## 2014-11-28 NOTE — Progress Notes (Signed)
[place]  [phone] [phone]     ID: [name] OB: 21-Mar-1961  MR#: [id number]  QVZ#:[id number]  PCP: Pcp Not In System GYN:  [name] SU:  OTHER MD: [name], [name], Jae Dire, [name], [name], [name]  CHIEF COMPLAINT: Stage IV breast cancer  CURRENT TREATMENT: Fulvestrant  BREAST CANCER HISTORY: From doctor [name]'s intake note 03/20/2004:  "The patient is a very pleasant 54 year old female, without significant past medical history.  Her family history is significant for a sister who at age 12 was diagnosed with invasive ductal carcinoma.  She is a breast cancer survivor at age 12 now.  The patient states that she has never really had a screening mammogram until October 2005, when she felt that it was time for her to start having mammograms done on a yearly basis.  Therefore, on 01/26/04, she underwent a screening mammogram and an abnormality was detected in the upper outer right breast.  She, therefore, underwent spot compression views of both the right and the left breast.  The left breast revealed a well-defined mass in the upper outer left quadrant, present at the 2 o'clock position, measuring 1.8 cm, 6 cm from the nipple.  This, by ultrasound, was felt to be a simple cyst measuring 1.8 cm.  On the right breast, a spiculated mass was noted in the upper outer right quadrant.  The ultrasound revealed a shadowing irregular solid mass at the 10:30 position, 9 cm from the nipple, measuring 1.2 cm in greatest dimension, correlating with the spiculated mass seen on the mammogram.  The right axilla was negative ultrasonically.  Because of this, the patient underwent a needle biopsy of the right breast and the biopsy was positive invasive mammary carcinoma that showed features consistent with a high-grade invasive ductal carcinoma associated with desmoplastic stroma.  No in situ  component was seen and no definite lymphovascular invasion was identified.  On the core biopsy, the tumor measured about 0.8 cm.  Because of this, she was seen by Dr. [name] and the patient was taken to the [facility] on March 15, 2004.  She underwent a right breast lumpectomy with sentinel node biopsy.  The final pathology revealed an invasive ductal carcinoma, measuring 1.7 cm, grade 2 of 3.  Margins were free of tumor.  Atypical lobular hyperplasia was noted.  One sentinel node was removed which was negative for metastatic disease.  The tumor was staged at T1c, N0 MX.  It was estrogen receptor positive, progesterone receptor positive.  HER-2/neu was 2+.  FISH was negative.  All margins were free of tumor.  She is now seen in Medical Oncology for further evaluation and management of this newly diagnosed T1c, node negative, stage I, invasive ductal carcinoma of the right breast."  Her subsequent history is as detailed below  INTERVAL HISTORY: Gissele returns today for follow-up of her breast cancer accompanied by her husband [name]. 5 days ago her stomach was "torn up" from the OxyContin that she stopped taking it. She has been using her OxyContin alone since that time on a very irregular basis. She could not tell me exactly how much she took or when she took it. This morning though she took an OxyContin again and now her stomach is again "torn up". She does not feel she can tolerate the fulvestrant today, which was her day 1 scheduled dose. She isn't even sure she can complete her 3 radiation treatments remaining.  REVIEW OF SYSTEMS: In addition to the pain, she is severely constipated. She has been using a magnesium tablet, proven juice, apple juice, actual prunes, and on that basis she was able to have 2 soft bowel movements 2 days ago, none yesterday. She thinks she might have a bowel movement today. She is very nauseated all the time. She has been using Zofran 4 mg here in there, but is afraid to  use "too much". She never started the Atarax. She has also been having some thrush problems which she is controlling with the nystatin. She felt extremely fatigued after her last radiation treatment and she is thinking that perhaps she should stop the radiation treatment at this point. She had been scheduled to have her Pleurx removed but she does not think she can have that done now because too much is going on and also because she is afraid that they used staples and that made her too much. She would like them to use stitches instead. She is scheduled for simulation later this week for the skull lesion she does not think however that she can tolerate that. She would like that moved to 3 weeks from now. Aside from these issues a detailed review of systems today was stable  PAST MEDICAL HISTORY: Past Medical History  Diagnosis Date  . Seizures 2010    Isolated incident.  Marland Kitchen PONV (postoperative nausea and vomiting)   . Peripheral vascular disease 02/2010    blood clot related to porta cath  . Breast cancer dx'd 2005/2011  . Bone metastases dx'd 05/2014  . S/P radiation therapy 07/17/2014 through 08/02/2014     Left mediastinum, left seventh rib 3250 cGy in 13 sessions     PAST SURGICAL HISTORY: Past Surgical History  Procedure Laterality Date  . Breast lumpectomy  2005  . Axillary lymph node dissection  Dec. 2011  . Portacath placement  12/11  . Removal portacath    . Mediastinotomy chamberlain mcneil Left 06/02/2013    Procedure: MEDIASTINOTOMY CHAMBERLAIN MCNEIL;  Surgeon: [name], MD;  Location: [facility] OR;  Service: Thoracic;  Laterality: Left;  LEFT ANTERIOR MEDIASTINOTOMY     FAMILY HISTORY Family History  Problem Relation Age of Onset  . COPD Mother   . Breast cancer Sister 16   The patient's father is living, 9 years old as of may 2015. He lives in [place]. The patient's mother died from  complications of COPD at the age of 51. These has 2 brothers, one sister. Her sister developed breast cancer at the age of 57. She is doing well. The patient herself underwent genetic testing at [facility] in 2011 and was found to be BRCA negative  GYNECOLOGIC HISTORY:  Menarche age 63, she is GX P0. She stopped having periods with her initial chemotherapy in 2006.  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 Her husband [name] is a Medical illustrator in [place]. He has a child from a prior marriage. At home they have 2 rescue dogs, Hobo and Gerster. The patient is religious but not a church attender    ADVANCED DIRECTIVES: In place; at the 08/04/2014 visit in particular the patient was very clear, with her husband present, that she would not want any kind of feeding tubes or "other tubes" if her condition deteriorated.   HEALTH MAINTENANCE: Social History  Substance Use Topics  . Smoking status: Never Smoker   . Smokeless tobacco:  Never Used  . Alcohol Use: No     Colonoscopy:  PAP:  Bone density: March 2015; mild osteopenia  Lipid panel:  Allergies  Allergen Reactions  . Decadron [Dexamethasone] Other (See Comments)    Patient does not tolerate steroids.   . Dilaudid [Hydromorphone] Nausea And Vomiting  . Fluconazole Swelling  . Hydromorphone Hcl Nausea And Vomiting  . Morphine And Related   . Protonix [Pantoprazole Sodium] Other (See Comments)    Patient reports it caused thrush.  . Tegaderm Ag Mesh [Silver]     Current Outpatient Prescriptions  Medication Sig Dispense Refill  . acetaminophen (TYLENOL) 325 MG tablet Take 2 tablets (650 mg total) by mouth every 12 (twelve) hours as needed for mild pain (or Fever >/= 101). (Patient not taking: Reported on 11/27/2014) 24 tablet 0  . ALPRAZolam (XANAX) 0.5 MG tablet Take 1 tablet (0.5 mg total) by mouth 2 (two) times daily as needed for anxiety. (Patient taking  differently: Take 0.125 mg by mouth 2 (two) times daily as needed for anxiety. ) 30 tablet 0  . b complex vitamins capsule Take 1 capsule by mouth daily.    . bisacodyl (DULCOLAX) 10 MG suppository Place 1 suppository (10 mg total) rectally daily as needed for moderate constipation or severe constipation. (Patient not taking: Reported on 11/27/2014) 12 suppository 0  . Calcium-Magnesium-Vitamin D (CITRACAL CALCIUM+D PO) Take by mouth. 454m vitamin C, 5060mvitamin D3 2 PO DAILY    . cholecalciferol (VITAMIN D) 1000 UNITS tablet Take 1,000 Units by mouth daily. Takes 2,000 Iu daily    . Diphenhyd-Hydrocort-Nystatin (FIRST-DUKES MOUTHWASH) SUSP 5-10 ml qid SWISH AND SPIT [id number] 3  . folic acid (FOLVITE) 1 MG tablet Take 1 mg by mouth daily.    . Marland Kitchenlycerin adult 2 G SUPP Place 1 suppository rectally as needed for moderate constipation. 12 each 0  . hydrOXYzine (ATARAX/VISTARIL) 25 MG tablet Take 1 tablet (25 mg total) by mouth 3 (three) times daily as needed for itching, anxiety, nausea or vomiting. (Patient not taking: Reported on 11/27/2014) 30 tablet 0  . ibuprofen (ADVIL,MOTRIN) 200 MG tablet Take 400 mg by mouth every 4 (four) hours as needed for moderate pain.     . Marland KitchenAGNESIUM PO Take 0.5 tablets by mouth daily as needed (constipation).    . Melatonin 1 MG TABS Take 3 mg by mouth at bedtime as needed (sleep).     . methocarbamol (ROBAXIN) 500 MG tablet Take 1 tablet (500 mg total) by mouth every 8 (eight) hours as needed for muscle spasms. (Patient not taking: Reported on 11/27/2014) 20 tablet 0  . nystatin (MYCOSTATIN) 100000 UNIT/ML suspension Take 5 mLs (500,000 Units total) by mouth 4 (four) times daily. 60 mL 0  . nystatin (MYCOSTATIN) 100000 UNIT/ML suspension Take 5 mLs (500,000 Units total) by mouth 4 (four) times daily. 180 mL 3  . ondansetron (ZOFRAN) 4 MG tablet Take 1 tablet (4 mg total) by mouth every 6 (six) hours as needed for nausea. 20 tablet 3  . ondansetron (ZOFRAN) 4 MG tablet Take  1 tablet (4 mg total) by mouth once. 20 tablet 0  . OxyCODONE (OXYCONTIN) 10 mg T12A 12 hr tablet Take 1 tablet (10 mg total) by mouth 2 (two) times daily. 60 tablet 0  . oxyCODONE (ROXICODONE INTENSOL) 20 MG/ML concentrated solution Place 0.3-0.5 mLs (6-10 mg total) under the tongue every hour as needed for severe pain or breakthrough pain. 30 mL 0  . senna-docusate (SENOKOT-S) 8.6-50 MG  per tablet Take 2 tablets by mouth 2 (two) times daily. (Patient not taking: Reported on 11/27/2014) 100 tablet 3  . [DISCONTINUED] metoCLOPramide (REGLAN) 10 MG tablet Take 1 tablet (10 mg total) by mouth 4 (four) times daily -  before meals and at bedtime. 60 tablet 3   No current facility-administered medications for this visit.    OBJECTIVE: Middle-aged white woman who appears stated age 84 Vitals:   11/28/14 0817  BP: 135/84  Pulse: 100  Resp: 18     Body mass index is 28.24 kg/(m^2).   Filed Vitals:   11/28/14 0817  BP: 135/84  Pulse: 100  Resp: 18      Patient refused vitals    ECOG FS:2 - Symptomatic, <50% confined to bed  Sclerae unicteric, pupils round and equal Oropharynx shows mild thrush No cervical or supraclavicular adenopathy Lungs no rales or rhonchi, decreased Bowers sounds lower half left lung Heart regular rate and rhythm Abd soft, nontender, positive bowel sounds, no masses palpated MSK no focal spinal tenderness, no chest wall tenderness Neuro: nonfocal, well oriented, anxious affect Breasts: Deferred   LAB RESULTS:   CMP     Component Value Date/Time   NA 136 11/27/2014 0856   NA 133* 11/20/2014 0433   K 3.8 11/27/2014 0856   K 3.6 11/20/2014 0433   CL 98* 11/20/2014 0433   CL 105 05/06/2012 1333   CO2 28 11/27/2014 0856   CO2 29 11/20/2014 0433   GLUCOSE 65* 11/27/2014 0856   GLUCOSE 106* 11/20/2014 0433   GLUCOSE 124* 05/06/2012 1333   BUN 8.1 11/27/2014 0856   BUN 7 11/20/2014 0433   CREATININE 0.8 11/27/2014 0856   CREATININE 0.56 11/20/2014 0433    CALCIUM 9.8 11/27/2014 0856   CALCIUM 8.4* 11/20/2014 0433   PROT 7.1 11/27/2014 0856   PROT 5.6* 11/20/2014 0433   ALBUMIN 3.1* 11/27/2014 0856   ALBUMIN 2.6* 11/20/2014 0433   AST 37* 11/27/2014 0856   AST 28 11/20/2014 0433   ALT 76* 11/27/2014 0856   ALT 62* 11/20/2014 0433   ALKPHOS 181* 11/27/2014 0856   ALKPHOS 105 11/20/2014 0433   BILITOT 0.31 11/27/2014 0856   BILITOT 0.7 11/20/2014 0433   GFRNONAA >60 11/20/2014 0433   GFRAA >60 11/20/2014 0433    No results found for: SPEP  Lab Results  Component Value Date   WBC 7.3 11/27/2014   NEUTROABS 6.3 11/27/2014   HGB 12.5 11/27/2014   HCT 37.5 11/27/2014   MCV 93.2 11/27/2014   PLT 751* 11/27/2014      Chemistry      Component Value Date/Time   NA 136 11/27/2014 0856   NA 133* 11/20/2014 0433   K 3.8 11/27/2014 0856   K 3.6 11/20/2014 0433   CL 98* 11/20/2014 0433   CL 105 05/06/2012 1333   CO2 28 11/27/2014 0856   CO2 29 11/20/2014 0433   BUN 8.1 11/27/2014 0856   BUN 7 11/20/2014 0433   CREATININE 0.8 11/27/2014 0856   CREATININE 0.56 11/20/2014 0433      Component Value Date/Time   CALCIUM 9.8 11/27/2014 0856   CALCIUM 8.4* 11/20/2014 0433   ALKPHOS 181* 11/27/2014 0856   ALKPHOS 105 11/20/2014 0433   AST 37* 11/27/2014 0856   AST 28 11/20/2014 0433   ALT 76* 11/27/2014 0856   ALT 62* 11/20/2014 0433   BILITOT 0.31 11/27/2014 0856   BILITOT 0.7 11/20/2014 0433       Lab Results  Component  Value Date   LABCA2 25 11/02/2007    No components found for: [id number]  No results for input(s): INR in the last 168 hours.  Urinalysis    Component Value Date/Time   COLORURINE YELLOW 11/17/2014 0143   APPEARANCEUR CLOUDY* 11/17/2014 0143   LABSPEC 1.010 11/17/2014 0143   LABSPEC 1.005 09/12/2013 1542   PHURINE 6.5 11/17/2014 0143   PHURINE 6.0 09/12/2013 1542   GLUCOSEU NEGATIVE 11/17/2014 0143   GLUCOSEU Negative 09/12/2013 1542   HGBUR NEGATIVE 11/17/2014 0143   HGBUR Negative  09/12/2013 1542   BILIRUBINUR NEGATIVE 11/17/2014 0143   BILIRUBINUR Negative 09/12/2013 1542   KETONESUR NEGATIVE 11/17/2014 0143   KETONESUR Negative 09/12/2013 1542   PROTEINUR NEGATIVE 11/17/2014 0143   PROTEINUR Negative 09/12/2013 1542   UROBILINOGEN 0.2 11/17/2014 0143   UROBILINOGEN 0.2 09/12/2013 1542   NITRITE NEGATIVE 11/17/2014 0143   NITRITE Negative 09/12/2013 1542   LEUKOCYTESUR NEGATIVE 11/17/2014 0143   LEUKOCYTESUR Negative 09/12/2013 1542    STUDIES: Dg Chest 2 View  11/13/2014   CLINICAL DATA:  Follow-up of pleural effusion  EXAM: CHEST  2 VIEW  COMPARISON:  PA and lateral chest x-ray of November 08, 2014  FINDINGS: The right lung is adequately inflated and clear. On the left there is a persistent moderate-sized pleural effusion. The left-sided chest tube is unchanged in position with its tip projecting over the posterior aspect of the fifth rib. There is patchy alveolar opacity in the left upper lobe which is stable. The left hemidiaphragm is elevated and obscured. There is curvature of the thoracolumbar spine and S-shaped configuration. There is a nonspecific gas pattern in the upper abdomen.  IMPRESSION: Stable appearance of the moderate-sized left pleural effusion and patchy alveolar opacity in the left upper lobe. The left chest tube is unchanged in position with its tip above the pleural effusion at the level of the posterior fifth rib.   Electronically Signed   By: David  Martinique M.D.   On: 11/13/2014 11:47   Dg Chest 2 View  11/08/2014   CLINICAL DATA:  History of breast cancer and left pleural catheter. Evaluate for a left pleural effusion. Decreased fluid drainage but fullness and pain in the left lower chest.  EXAM: CHEST  2 VIEW  COMPARISON:  10/05/2014 and 10/30/2014  FINDINGS: Stable position of the left pleural catheter. There are densities in the left upper lobe that correspond with the nodular disease seen on the PET-CT from 10/30/2014. There continues to be a  pleural collection at the left lung base which has minimally changed in size. Right lung remains clear. Surgical clips in the right axilla. Heart and mediastinum are stable. Negative for a pneumothorax.  IMPRESSION: Persistent parenchymal densities in the left upper lobe which were present on the recent PET-CT. These densities are nonspecific and may be better characterized with a follow-up chest CT.  Stable position of the left chest tube. There is persistent loculated fluid at the left lung base which has minimally changed.   Electronically Signed   By: Markus Daft M.D.   On: 11/08/2014 09:47   Nm Pet Image Restag (ps) Skull Base To Thigh  10/30/2014   CLINICAL DATA:  Subsequent treatment strategy for breast cancer.  EXAM: NUCLEAR MEDICINE PET SKULL BASE TO THIGH  TECHNIQUE: 8.0 mCi F-18 FDG was injected intravenously. Full-ring PET imaging was performed from the skull base to thigh after the radiotracer. CT data was obtained and used for attenuation correction and anatomic localization.  FASTING BLOOD  GLUCOSE:  Value: 88 mg/dl  COMPARISON:  03/29/2014  FINDINGS: NECK  No hypermetabolic lymph nodes in the neck.  CHEST  Left axillary node measures 0.9 cm and has an SUV max equal to 11.05. New from previous exam. Resolution of previous hyper metabolism associated with the sub- carinal lymph node. There is a new hypermetabolic left infrahilar lymph node which has an SUV max equal to 6.8.  Left-sided chest tube is in place. The loculated left pleural effusion is stable to decreased in volume from previous exam. Again noted is extensive hypermetabolic trans pleural spread of tumor involving the left hemi thorax. Anterior paramediastinal pleural tumor measures 5 x 1.6 cm and has an SUV max equal to 4.28. On the previous exam this measured 7.3 x 3.3 cm and had an SUV max equal to 14.28.Tumor invading the ventral chest wall appears decreased from previous exam. On today's study this measures approximately 1.3 cm in  thickness and has an SUV max equal to 6.24. On the previous exam this measure 2.1 cm in thickness and had an SUV max equal to 14.28. Bulky pleural tumor at the posterior medial left base is again noted involving the T11 neuro foramina and extending into the last lateral recess of the canal it this level. On today's study this tumor measures approximately 5.9 cm and has an SUV max equal to 10.5. Previously this measured approximately 5.4 cm and had an SUV max equal to 12.9.  Patchy areas of consolidation and ground-glass attenuation are identified within the left lung and appear new from previous exam. These are nonspecific and may reflect changes of external beam radiation. Pulmonary parenchymal metastasis not excluded. Index nodule within the left upper lobe measures 2.1 cm and has an SUV max equal to 3.7. Within the superior segment of the right lower lobe there is a new parenchymal nodule measuring 11 mm within SUV max equal to 2.9.  ABDOMEN/PELVIS  No abnormal hypermetabolic activity within the liver, pancreas, adrenal glands, or spleen. 1.6 cm periaortic lymph node is identified within SUV max equal to 11.6. Previously this lymph node measured 1.4 cm and had an SUV max equal 7.5. Adjacent lymph node measures 1.1 cm and has an SUV max of 8.4. Previously this mesh 0.9 cm and had an SUV max equal to 9.28. More caudal cluster of periaortic lymph nodes measures 2.5 cm and has an SUV max equal to 6.3 cm. Previously 1.9 cm within SUV max equal to 6.3. No enlarged or hypermetabolic pelvic or inguinal lymph nodes.  SKELETON  No focal hypermetabolic activity to suggest skeletal metastasis.  IMPRESSION: 1. Interval mixed response to therapy. Specifically, the bulky pleural based tumor along the paramediastinal left upper lobe extending into the ventral chest wall has decreased in size and degree of FDG uptake. Additionally, there is been resolution of previous hyper metabolism associated with the sub- carinal adenopathy.  Persistent pleural based tumor overlying the posterior and medial left lung base is again noted. There is continued extension into the T11 neuro foramina and lateral recess of the canal. 2. New hypermetabolic left axillary and left hilar adenopathy. Additionally, persistent hypermetabolic adenopathy within the upper abdomen demonstrates mild increase in size in the interval. 3. Bilateral pulmonary nodular opacities are identified and are nonspecific. These exhibit malignant range FDG uptake and are new from previous exam. Findings may reflect nonspecific pneumonitis and may also be seen with changes of external beam radiation. Pulmonary metastasis not excluded.   Electronically Signed   By: Lovena Le  Clovis Riley M.D.   On: 10/30/2014 16:12   Dg Abd 2 Views  11/24/2014   CLINICAL DATA:  Severe constipation, new narcotic regimen, no bowel movement since 11/21/2014. History of breast cancer.  EXAM: ABDOMEN - 2 VIEW  COMPARISON:  None.  FINDINGS: Bowel gas pattern is nonobstructive. Fairly large amount of stool is seen throughout the grossly nondistended colon compatible with given history of constipation. No evidence of soft tissue mass or abnormal fluid collection. No free intraperitoneal air.  Mild degenerative change is seen within the scoliotic thoracolumbar spine. No acute-appearing osseous abnormality. Airspace opacity at the left lung base is compatible with the effusion and pleural disease identified on recent PET-CT of 10/30/2014.  IMPRESSION: Fairly large amount of stool throughout the nondistended colon, consistent with given history of constipation.  Nonobstructive bowel gas pattern.   Electronically Signed   By: Franki Cabot M.D.   On: 11/24/2014 09:56   Ir Radiologist Eval & Mgmt  11/14/2014   CLINICAL DATA:  Referring physician: Lurline Del  Reason for consultation: Left PleurX  Past medical history: Pleural effusionBreast cancer of upper-inner quadrant of right female breast, Secondary malignant  neoplasm of mediastinal lymph node, Abnormal LFTs (liver function tests), Chest wall pain, Post-lymphadenectomy lymphedema of arm  HISTORY OF PRESENT ILLNESS: Metastatic breast carcinoma. Large malignant left pleural effusion. PleurX catheter placed 09/01/2013 by Dr. Pascal Lux. Decreasing output since July. PET-CT 10/30/2014 demonstrated residual loculated subpulmonic effusion with extensive associated pleural metastatic disease. This has remained grossly stable on subsequent chest x-rays through 11/13/2014. The remainder of the fairly extensive effusion seen preprocedure has resolved. There is involvement of a left lower thoracic neural foramen from extension of pleural disease. She is not having any problems at the skin entry site of the PleurX catheter.  PROCEDURE: We connected the catheter up for drainage but could only get about 10 cc.  ASSESSMENT AND PLAN: My impression is that she has had extensive pleurodesis secondary to the presence of the PleurX catheter with resolution of much of the previously noted malignant left pleural effusion. The residual loculated subpulmonic fluid is associated with a moderate amount of adjacent pleural metastatic disease and and not amenable to repositioning of the catheter. In fact, this may not even significantly decompress with thoracentesis if it is loculated or inaccessible due to overlying tumor. I discussed the findings with the patient and her spouse.  My recommendation is that we remove the catheter. She is interested in trying thoracentesis, to aspirate the residual subpulmonic effusion at the same time, in hopes of some symptomatic relief, and I think it would be a reasonable to look with ultrasound to see if this is feasible. We can schedule this next week with Dr. Pascal Lux as she has requested.   Electronically Signed   By: Lucrezia Europe M.D.   On: 11/14/2014 16:21    ASSESSMENT: 54 y.o. [place] woman with stage IV breast cancer, history as follows  (1)  S/p Right  lumpectomy and sentinel lymph node sampling 03/15/2004 for a pT1c pN0. Stage IA invasive ductal carcinoma, grade 2, estrogen receptor 95% positive, progesterone receptor 65% positive, HER-2 not amplified; additional surgery 04/25/2004 for seroma or clearance showed no residual tumor  (2) adjuvant chemotherapy with cyclophosphamide and doxorubicin every 21 days x4 completed 07/19/2004  (3) adjuvant radiation given under Dr. Donella Stade in [place] completed July 2006  (4) the patient opted against adjuvant antiestrogen therapy  (5) genetics testing showed no BRCA mutations  (6) biopsy of a palpable right axillary  mass 10/24/2009 showed invasive ductal carcinoma, grade 3, estrogen receptor 100% positive, progesterone receptor 2% positive (alert score 5) HER-2 negative; no evidence of systemic disease on PET scanning  (7) completed 3 of 4 planned cycles of docetaxel and cyclophosphamide September 2011, fourth cycle omitted because of marked elevations in liver function tests  (8) an right axillary lymph node dissection 03/06/2010 showed 3/8 lymph nodes removed to be involved by tumor, with extracapsular extension.  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, TTS-1 negative), estrogen receptor 80% positive, progesterone receptor 2% positive, HER-2 not amplified  (12) letrozole started March 2015-- tolerated with significant side effects, discontinued at the end of May 2015  (13) PET scan 08/16/2013 shows extensive left pleural metastatic disease and a large left pleural effusion that shifts cardiac and  mediastinal structures to the right; adenopathy (celiac trunk, periadrenal, periaortic); and a left medial clavicular lesion; Status post left thoracentesis 08/16/2013 positive for adenocarcinoma, estrogen receptor positive, progesterone receptor negative.  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; everolimus was suggested as an available targeted agent  (18) exemestane started 03/31/2014, discontinued 10/31/2014 with evidence of progression  (a) everolimus added 04/03/2014 but not tolerated (cytopenias, elevated LFTs) even at minimal doses; stopped 04/17/2014  (19) fulvestrant to start 11/14/2014-- postponed    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 treated with Lovenox for 5-6 months  (c) right upper extremity lymphedema  (d) hepatic steatosis with chronically elevated LFTs as well as unusual hepatic sensitivity to chemotherapy  (e) osteopenia with the lowest T score -1.6 on bone density scan 06/20/2013  (f) radiation oncology (Dr Valere Dross) has reviewed prior radiation records in case there is further mediastinal involvement with dysphagia etc in which case palliative XRT could be considered  (a) radiation to left mediastinum/ left 7th rib 3250 cGy in 13 sessions04/18/2016 through 08/02/2014  (b) radiation to T11 area to be completed 11/30/2014  (g) chest wall pain   PLAN: I spent  approximately 45 minutes today with [name] and [name] going over her situation. Even though she felt despondent and a bit desperate, I actually think she is better as far as the pain is concerned. She has not been using the OxyContin for the past 72 hours. She has been using very little of the Roxycodone. This is very different from the way she was last week, where she was creaming and writhing on the floor because of pain. So I think the radiation is working.  I strongly urged her to complete her radiation treatments even though they may make her a little bit nauseated and certainly fatigued. She will try to get done with the last 3 once this week.  She was going to have simulation for her left parietal scalp lesion September 1, but she doesn't feel she can do that and she wants to move that to next week. She will discuss that with Dr. Valere Dross today.  I reassured her that her increased platelet count was reactive. The liver function tests are not significantly changed except for the alkaline phosphatase which may be secondary to radiation.  We have postponed removal of the Pleurx catheter partly because she is overwhelmed right now and partly because she really does not want to have staples put in. She would like to have stitches instead. She is very concerned that staples would cause too much pain. I do not know if interventional radiology can put in stitches. If not we will ask thoracic surgery to consider removing the Pleurx.  She would also like to have the small pocket of fluid in her left lung drained, but that can be done separately from the Pleurx removal.  We then discussed pain issues. She will use the rocks the content alone. She has been using 0.3 mL at a time. She will write down every dose and the time that she takes it. I have told her she can take 0.3-0.5 mL every 2 hours as needed. She should plan to take a dose in the middle of the night so she doesn't wake up and pain. Within the next 2  days she should have titrated it so that she knows exactly how much he needs to take throughout the day to keep her pain well-controlled  For nausea I suggested she take Atarax 0.5 mg 3 times a day in addition to Zofran 4-8 mg as needed.  For the constipation that she has been experiencing I suggested she continue the prune juice apple juice and other fruits, the magnesium as needed, but also stool softeners and MiraLAX. Her goal is to have a bowel movement at least every other day and it must be soft. If she has not had a bowel movement by the third day she may use any laxative she wishes which she must have a bowel movement that day.  All this information was given to her in writing. In addition, she does not feel she can tolerate fulvestrant although I reassured her that this is very very well-tolerated generally. We are postponing that until September 21 at her request. She will see me again that day.  Recent no stool call for any problems that may develop before her next visit.    Chauncey Cruel, MD   11/28/2014 8:21 AM

## 2014-11-29 ENCOUNTER — Ambulatory Visit: Payer: 59

## 2014-11-29 NOTE — Addendum Note (Signed)
Encounter addended by: Benn Moulder, RN on: 11/29/2014  9:15 AM<BR>     Documentation filed: Charges VN

## 2014-11-30 ENCOUNTER — Ambulatory Visit: Payer: 59 | Admitting: Radiation Oncology

## 2014-11-30 ENCOUNTER — Ambulatory Visit: Payer: Self-pay | Admitting: Internal Medicine

## 2014-11-30 ENCOUNTER — Ambulatory Visit: Payer: 59

## 2014-11-30 ENCOUNTER — Telehealth: Payer: Self-pay | Admitting: Oncology

## 2014-11-30 NOTE — Telephone Encounter (Signed)
[name] [name] called and said she continues to have burning in her stomach that is constant and feels like a knife scraping.  She is taking Prilosec without relief.  She also reports nausea and throwing up "only acid" this morning.  She is not taking any pain medication. She would like something called in as soon as possible to [facility] on [address].  Advised her that Dr. Valere Dross and Patric Dykes, RN would be notified.

## 2014-12-01 ENCOUNTER — Ambulatory Visit: Payer: 59

## 2014-12-01 ENCOUNTER — Ambulatory Visit: Payer: 59 | Attending: Oncology | Admitting: Physical Therapy

## 2014-12-01 DIAGNOSIS — I890 Lymphedema, not elsewhere classified: Secondary | ICD-10-CM | POA: Insufficient documentation

## 2014-12-01 DIAGNOSIS — M25551 Pain in right hip: Secondary | ICD-10-CM | POA: Insufficient documentation

## 2014-12-01 DIAGNOSIS — M545 Low back pain: Secondary | ICD-10-CM | POA: Diagnosis present

## 2014-12-01 DIAGNOSIS — G729 Myopathy, unspecified: Secondary | ICD-10-CM | POA: Diagnosis present

## 2014-12-01 DIAGNOSIS — M6289 Other specified disorders of muscle: Secondary | ICD-10-CM

## 2014-12-01 NOTE — Therapy (Signed)
[place], [place], [address] Phone: [phone]   Fax:  [phone]  Physical Therapy Treatment  Patient Details  Name: QUINTESSA SIMMERMAN MRN: [id number] Date of Birth: 03/24/1961 Referring Provider:  Chauncey Cruel, MD  Encounter Date: 12/01/2014      PT End of Session - 12/01/14 1108    Visit Number 38   Number of Visits 76   Date for PT Re-Evaluation 02/09/15   PT Start Time 1020   PT Stop Time 1102   PT Time Calculation (min) 42 min   Activity Tolerance Patient tolerated treatment well   Behavior During Therapy [facility] for tasks assessed/performed      Past Medical History  Diagnosis Date  . Seizures 2010    Isolated incident.  Marland Kitchen PONV (postoperative nausea and vomiting)   . Peripheral vascular disease 02/2010    blood clot related to porta cath  . Breast cancer dx'd 2005/2011  . Bone metastases dx'd 05/2014  . S/P radiation therapy 07/17/2014 through 08/02/2014     Left mediastinum, left seventh rib 3250 cGy in 13 sessions     Past Surgical History  Procedure Laterality Date  . Breast lumpectomy  2005  . Axillary lymph node dissection  Dec. 2011  . Portacath placement  12/11  . Removal portacath    . Mediastinotomy chamberlain mcneil Left 06/02/2013    Procedure: MEDIASTINOTOMY CHAMBERLAIN MCNEIL;  Surgeon: [name], MD;  Location: [facility];  Service: Thoracic;  Laterality: Left;  LEFT ANTERIOR MEDIASTINOTOMY     There were no vitals filed for this visit.  Visit Diagnosis:  Lymphedema  Muscle stiffness      Subjective Assessment - 12/01/14 1021    Subjective Got so sick (acid reflux) I canceled the end of the week's radiation. Has gone off the pain meds.  Has lost 20 lbs. from not eating much since 11/16/14.   Currently in Pain? Yes  left back pain not bad   Pain Score 4    Pain Location  Axilla   Pain Orientation Right   Aggravating Factors  increased fluid   Pain Relieving Factors manual lymph drainage                         [facility] Adult PT Treatment/Exercise - 12/01/14 0001    Manual Therapy   Manual Lymphatic Drainage (MLD) In left sidelying, posterior interaxillary anastomosis and right axillo-inguinal anastomosis; in supine, short neck, left axilla and anterior interaxillary anastomosis, right groin and axillo-inguinal anastomosis, area between right breast scars, directing toward pathways.  In right sidelying, left periscapular area toward left groin and right to left interaxillary anastomosis.   Manual Therapy Other (comment)  In Rt. sidelying, soft tissue work to left back, neck                         Long Term Clinic Goals - 12/01/14 1111    CC Long Term Goal  #1   Title Reports left upper back and left side pain is controlled at 4/10 or less with therapy at the current frequency.   Status On-going   CC Long Term Goal  #2   Title Pt. will report swelling is adequately managed to enable ADL function at a consistent level.   Baseline Swelling is being managed, though it continues, but activity is limited by other medical issues.   Status On-going  Plan - 12/01/14 1109    Clinical Impression Statement Patient looks and moves like she doesn't feel well today, and requests that head of bed be elevated due to reflux discomfort.  Right lateral breast area fullness is increased today from recent  sessions, feeling firmer.  Back pain complaints minimal today.    Pt will benefit from skilled therapeutic intervention in order to improve on the following deficits Increased edema;Pain;Increased fascial restricitons   Rehab Potential Good   Clinical Impairments Affecting Rehab Potential active cancer   PT Frequency 2x / week   PT Duration 12 weeks   PT Treatment/Interventions Manual lymph drainage;Manual techniques   PT Next  Visit Plan manual lymph drainage, soft tissue work; trial of electrical stimulation for pain relief if needed   Consulted and Agree with Plan of Care Patient        Problem List Patient Active Problem List   Diagnosis Date Noted  . Nausea with vomiting 11/18/2014  . Constipation 11/18/2014  . Left-sided thoracic back pain   . Bone metastases 11/16/2014  . Back pain 11/15/2014  . 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 Uncontrolled pain 11/14/2014  . 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 Chest wall pain 03/21/2014  . Abnormal LFTs (liver function tests) 09/12/2013  . Pleural effusion 08/18/2013  . 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

## 2014-12-05 ENCOUNTER — Ambulatory Visit: Payer: 59 | Admitting: Physical Therapy

## 2014-12-05 ENCOUNTER — Encounter (HOSPITAL_COMMUNITY): Payer: Self-pay | Admitting: Pharmacist

## 2014-12-05 ENCOUNTER — Encounter: Payer: Self-pay | Admitting: Radiation Oncology

## 2014-12-05 ENCOUNTER — Ambulatory Visit
Admission: RE | Admit: 2014-12-05 | Discharge: 2014-12-05 | Disposition: A | Discharge status: Home / Self Care | Payer: 59 | Source: Ambulatory Visit | Attending: Radiation Oncology | Admitting: Radiation Oncology

## 2014-12-05 ENCOUNTER — Ambulatory Visit: Payer: 59

## 2014-12-05 VITALS — BP 129/83 | HR 92 | Temp 98.1°F

## 2014-12-05 DIAGNOSIS — I890 Lymphedema, not elsewhere classified: Secondary | ICD-10-CM

## 2014-12-05 DIAGNOSIS — M6289 Other specified disorders of muscle: Secondary | ICD-10-CM

## 2014-12-05 DIAGNOSIS — C7951 Secondary malignant neoplasm of bone: Secondary | ICD-10-CM

## 2014-12-05 NOTE — Progress Notes (Addendum)
CC: Dr. Gunnar Bulla Magrinat  Weekly Management Note:  Site: Lower thoracic spine/left paravertebral region Current Dose:  2200  cGy Projected Dose: 2200  cGy  Narrative: The patient is seen today for routine under treatment assessment. CBCT/MVCT images/port films were reviewed. The chart was reviewed.   The patient was scheduled for 3 additional fractions of radiation therapy but she wants to stop at 2200 cGy since her left paravertebral/flank pain is much improved, and she is finally eating again.  She developed gastritis last week which was improved with Zofran and Carafate.  She resumed a normal diet last night.  2 days ago she developed right groin discomfort which she believes was secondary to straining because of constipation.  This discomfort is much improved today. She is scheduled for treatment planning/simulation for her left parietal metastasis this Thursday.  Physical Examination:  Filed Vitals:   12/05/14 0902  BP: 129/83  Pulse: 92  Temp: 98.1 F (36.7 C)  .  Weight:  .  There is a to 2.5 cm swelling along the left parietal scalp.  Impression: We will stop treatment to her lower thoracic spine/paravertebral region at her request.  We will have her return for CT simulation for her left parietal metastasis this Thursday and begin her treatment on September 12.  We discussed the potential acute and late toxicities of electron beam radiation therapy including alopecia which could be permanent.  She wants to limit the dose of radiation therapy to her scalp/calvarium, and instead of delivering 3000 cGy in 10 fractions I will deliver 2400 cGy in 8 fractions.  Consent is signed today.  Plan: CT simulation this Thursday and begin radiation therapy to her left calvarium next Monday.

## 2014-12-05 NOTE — Progress Notes (Signed)
Complex simulation/treatment planning note: On 11/16/2014, as an inpatient, [name] [name] was taken to the CT simulator.  She was placed supine.  Her lower chest was scanned.  An isocenter was chosen along the lower thoracic spine.  Her clinical target volume was contoured along with normal surrounding structures.  I prescribed 400 cGy in a single session to be followed by 2700 cGy in 9 sessions.

## 2014-12-05 NOTE — Progress Notes (Signed)
[facility] End of Treatment Note  Name:[name] [name]  Date: 12/05/2014 ZOX:[id number] DOB:[birth year]   Status:inpatient/outpatient    CC: Dr. Gunnar Bulla Magrinat  REFERRING PHYSICIAN: Dr. 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  She was placed on a treatment rest him treated with Zofran and Carafate with improvement of her symptomatology.  She was able to discontinue her narcotics.  Since she was placed for palliation she wants to stop treatment after her seventh of a scheduled 10 sessions.  She will return later this week for CT simulation and initiation of treatment for her  left parietal metastasis.                        PLAN: CT simulation this Thursday morning.  She'll begin her left calvarial electron beam radiation on Monday, September 12.

## 2014-12-05 NOTE — Therapy (Signed)
[place], [place], [address] Phone: [phone]   Fax:  [phone]  Physical Therapy Treatment  Patient Details  Name: [name] [name] MRN: [id number] Date of Birth: [birth year] Referring Provider:  Chauncey Cruel, MD  Encounter Date: 12/05/2014      PT End of Session - 12/05/14 1727    Visit Number 42   Number of Visits 17   Date for PT Re-Evaluation 02/09/15   PT Start Time 1253   PT Stop Time 1340   PT Time Calculation (min) 47 min   Activity Tolerance Patient tolerated treatment well   Behavior During Therapy [facility] for tasks assessed/performed      Past Medical History  Diagnosis Date  . Seizures 2010    Isolated incident.  Marland Kitchen PONV (postoperative nausea and vomiting)   . Peripheral vascular disease 02/2010    blood clot related to porta cath  . Breast cancer dx'd 2005/2011  . Bone metastases dx'd 05/2014  . S/P radiation therapy 07/17/2014 through 08/02/2014     Left mediastinum, left seventh rib 3250 cGy in 13 sessions     Past Surgical History  Procedure Laterality Date  . Breast lumpectomy  2005  . Axillary lymph node dissection  Dec. 2011  . Portacath placement  12/11  . Removal portacath    . Mediastinotomy chamberlain mcneil Left 06/02/2013    Procedure: MEDIASTINOTOMY CHAMBERLAIN MCNEIL;  Surgeon: [name], MD;  Location: [facility];  Service: Thoracic;  Laterality: Left;  LEFT ANTERIOR MEDIASTINOTOMY     There were no vitals filed for this visit.  Visit Diagnosis:  Lymphedema  Muscle stiffness      Subjective Assessment - 12/05/14 1258    Subjective Was able to walk around on Sunday, but yesterday this pain developed at right posterior groin.   Currently in Pain? Yes   Pain Score 7    Pain Location Groin   Pain Orientation Right;Posterior   Pain Descriptors / Indicators  Tightness   Aggravating Factors  standing, walking   Pain Relieving Factors ice, ibuprofen                         [facility] Adult PT Treatment/Exercise - 12/05/14 0001    Manual Therapy   Manual Lymphatic Drainage (MLD) In left sidelying, posterior interaxillary anastomosis and right axillo-inguinal anastomosis; in supine, short neck, left axilla and anterior interaxillary anastomosis, right groin and axillo-inguinal anastomosis, area between right breast scars, directing toward pathways.  In right sidelying, left periscapular area toward left groin.   Manual Therapy Other (comment)  In Rt. sidelying, soft tissue work right ischial tuberosity                        Long Term Clinic Goals - 12/01/14 1111    CC Long Term Goal  #1   Title Reports left upper back and left side pain is controlled at 4/10 or less with therapy at the current frequency.   Status On-going   CC Long Term Goal  #2   Title Pt. will report swelling is adequately managed to enable ADL function at a consistent level.   Baseline Swelling is being managed, though it continues, but activity is limited by other medical issues.   Status On-going            Plan - 12/05/14 1728    Clinical Impression Statement Patient came in today  needing someone to lean on to be able to walk due to significant pain at right groin area and posterior, which she feels must have stemmed from her straining to have a bowel movement while on narcotic pain killers.  She reported some benefit from self treatment of cold pack and trigger point pressure to the area; she also felt some benefit of soft tissue work and trigger point pressure in therapy today in the area of pain near right ischial tuberosity.    Pt will benefit from skilled therapeutic intervention in order to improve on the following deficits Increased edema;Pain;Increased fascial restricitons   Rehab Potential Good   Clinical Impairments Affecting Rehab  Potential active cancer   PT Frequency 2x / week   PT Duration 12 weeks   PT Treatment/Interventions Manual lymph drainage;Manual techniques   PT Next Visit Plan manual lymph drainage, soft tissue work; trial of electrical stimulation for pain relief if needed   Consulted and Agree with Plan of Care Patient        Problem List Patient Active Problem List   Diagnosis Date Noted  . Nausea with vomiting 11/18/2014  . Constipation 11/18/2014  . Left-sided thoracic back pain   . Bone metastases 11/16/2014  . Back pain 11/15/2014  . 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 Uncontrolled pain 11/14/2014  . 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 Chest wall pain 03/21/2014  . Abnormal LFTs (liver function tests) 09/12/2013  . Pleural effusion 08/18/2013  . 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

## 2014-12-06 ENCOUNTER — Ambulatory Visit: Payer: 59

## 2014-12-07 ENCOUNTER — Ambulatory Visit: Payer: 59 | Admitting: Physical Therapy

## 2014-12-07 ENCOUNTER — Ambulatory Visit: Payer: 59

## 2014-12-07 ENCOUNTER — Ambulatory Visit
Admission: RE | Admit: 2014-12-07 | Discharge: 2014-12-07 | Disposition: A | Discharge status: Home / Self Care | Payer: 59 | Source: Ambulatory Visit | Attending: Radiation Oncology | Admitting: Radiation Oncology

## 2014-12-07 DIAGNOSIS — Z510 Encounter for antineoplastic radiation therapy: Secondary | ICD-10-CM | POA: Diagnosis not present

## 2014-12-07 DIAGNOSIS — C7951 Secondary malignant neoplasm of bone: Secondary | ICD-10-CM

## 2014-12-07 NOTE — Addendum Note (Signed)
Encounter addended by: [name], MD on: 12/07/2014  8:25 PM<BR>     Documentation filed: Notes Section

## 2014-12-07 NOTE — Progress Notes (Addendum)
Complex simulation/treatment planning note: The patient was taken to the CT simulator.  A head cast was constructed for immobilization.  She was then scanned.  An isocenter was chosen at 100 SSD.  The CT data set was sent to the planning system I contoured her CTV.  This will be expanded by his 2.0 cm to create a block for her electron beam therapy.  I prescribing 2400 cGy in 8 sessions utilizing 6 MEV electrons.  Addendum: She finished her treatment planning today.  She was set up en face with 9 MEV electrons.  One custom block is constructed to conform the field.  A complex isodose plan is reviewed.

## 2014-12-08 ENCOUNTER — Ambulatory Visit: Payer: 59 | Admitting: Physical Therapy

## 2014-12-08 ENCOUNTER — Encounter: Payer: Self-pay | Admitting: Physical Therapy

## 2014-12-08 DIAGNOSIS — I890 Lymphedema, not elsewhere classified: Secondary | ICD-10-CM | POA: Diagnosis not present

## 2014-12-08 DIAGNOSIS — M6289 Other specified disorders of muscle: Secondary | ICD-10-CM

## 2014-12-08 DIAGNOSIS — M25551 Pain in right hip: Secondary | ICD-10-CM

## 2014-12-08 NOTE — Therapy (Signed)
[place], [place], [address] Phone: [phone]   Fax:  [phone]  Physical Therapy Treatment  Patient Details  Name: [name] [name] MRN: [id number] Date of Birth: 08/29/1960 Referring Provider:  Chauncey Cruel, MD  Encounter Date: 12/08/2014      PT End of Session - 12/08/14 1156    Visit Number 78   Number of Visits 88   Date for PT Re-Evaluation 02/09/15   PT Start Time 1055   PT Stop Time 1144   PT Time Calculation (min) 49 min   Activity Tolerance Patient tolerated treatment well   Behavior During Therapy [facility] for tasks assessed/performed      Past Medical History  Diagnosis Date  . Seizures 2010    Isolated incident.  Marland Kitchen PONV (postoperative nausea and vomiting)   . Peripheral vascular disease 02/2010    blood clot related to porta cath  . Breast cancer dx'd 2005/2011  . Bone metastases dx'd 05/2014  . S/P radiation therapy 07/17/2014 through 08/02/2014     Left mediastinum, left seventh rib 3250 cGy in 13 sessions     Past Surgical History  Procedure Laterality Date  . Breast lumpectomy  2005  . Axillary lymph node dissection  Dec. 2011  . Portacath placement  12/11  . Removal portacath    . Mediastinotomy chamberlain mcneil Left 06/02/2013    Procedure: MEDIASTINOTOMY CHAMBERLAIN MCNEIL;  Surgeon: [name], MD;  Location: [facility];  Service: Thoracic;  Laterality: Left;  LEFT ANTERIOR MEDIASTINOTOMY     There were no vitals filed for this visit.  Visit Diagnosis:  Lymphedema  Muscle stiffness  Pain in joint involving right pelvic region and thigh      Subjective Assessment - 12/08/14 1054    Subjective Walking a little better. Now using a cane.   Has been icing.  Has a cough from a chalky medication.  Has had only one electric shock episode in right axilla.  Tried to drain with  PleurX tube yesterday but they didn't get anything out. Feels a hardness in right upper breast (but this has been there a while).                                                                    Currently in Pain? Yes   Pain Score 6    Pain Location Other (Comment)  near ischial tuberosity; no longer at groin   Pain Orientation Right   Pain Descriptors / Indicators --  no longer tight   Pain Relieving Factors ice                         [facility] Adult PT Treatment/Exercise - 12/08/14 0001    Manual Therapy   Manual Lymphatic Drainage (MLD) In left sidelying, posterior interaxillary anastomosis and right axillo-inguinal anastomosis; in supine, short neck, left axilla and anterior interaxillary anastomosis, right groin and axillo-inguinal anastomosis, area between right breast scars, directing toward pathways.  In right sidelying, left periscapular area toward left groin.   Manual Therapy Other (comment)  In Rt. sidelying, soft tissue work right ischial tuberosity  Long Term Clinic Goals - 12/08/14 1200    CC Long Term Goal  #1   Title Reports left upper back and left side pain is controlled at 4/10 or less with therapy at the current frequency.   Status On-going   CC Long Term Goal  #2   Title Pt. will report swelling is adequately managed to enable ADL function at a consistent level.   Status On-going            Plan - 12/08/14 1157    Clinical Impression Statement Patient is making progress with the newer pain she came in with last session, using ice at home.  She also feels that soft tissue work that we have done here to trigger poinat at area of right ischial tuberosity makes a difference.  Area at right lateral breast was quite firm and full with fluid today.   Pt will benefit from skilled therapeutic intervention in order to improve on the following deficits Increased edema;Pain;Increased fascial restricitons   Rehab  Potential Good   Clinical Impairments Affecting Rehab Potential active cancer   PT Frequency 2x / week   PT Duration 12 weeks   PT Treatment/Interventions Manual lymph drainage;Manual techniques   PT Next Visit Plan manual lymph drainage, soft tissue work; trial of electrical stimulation for pain relief if needed   Consulted and Agree with Plan of Care Patient        Problem List Patient Active Problem List   Diagnosis Date Noted  . Nausea with vomiting 11/18/2014  . Constipation 11/18/2014  . Left-sided thoracic back pain   . Bone metastases 11/16/2014  . Back pain 11/15/2014  . 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 Uncontrolled pain 11/14/2014  . 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 Chest wall pain 03/21/2014  . Abnormal LFTs (liver function tests) 09/12/2013  . Pleural effusion 08/18/2013  . 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

## 2014-12-11 ENCOUNTER — Ambulatory Visit: Payer: 59 | Admitting: Physical Therapy

## 2014-12-11 ENCOUNTER — Ambulatory Visit
Admission: RE | Admit: 2014-12-11 | Discharge: 2014-12-11 | Disposition: A | Discharge status: Home / Self Care | Payer: 59 | Source: Ambulatory Visit | Attending: Radiation Oncology | Admitting: Radiation Oncology

## 2014-12-11 DIAGNOSIS — C7951 Secondary malignant neoplasm of bone: Secondary | ICD-10-CM

## 2014-12-11 DIAGNOSIS — Z510 Encounter for antineoplastic radiation therapy: Secondary | ICD-10-CM | POA: Diagnosis not present

## 2014-12-11 DIAGNOSIS — M6289 Other specified disorders of muscle: Secondary | ICD-10-CM

## 2014-12-11 DIAGNOSIS — I890 Lymphedema, not elsewhere classified: Secondary | ICD-10-CM

## 2014-12-11 DIAGNOSIS — M25551 Pain in right hip: Secondary | ICD-10-CM

## 2014-12-11 NOTE — Progress Notes (Signed)
Weekly Management Note:  Site: Left parietal calvarium Current Dose:  300  cGy Projected Dose: 2400  cGy  Narrative: The patient is seen today for routine under treatment assessment. CBCT/MVCT images/port films were reviewed. The chart was reviewed.   The patient is somewhat upset today because of the size of her electron beam block.  She is concerned about the expected degree of hair loss.  She did not want me to deliver 3000 cGy in 10 sessions, but rather 2400 cGy in 8 sessions.  Physical Examination: There were no vitals filed for this visit..  Weight:  .  No change.  Impression: Tolerating radiation therapy well.  I reviewed her treatment plan with her and her area of hair loss will be approximately 57 m.  She may not have permanent hair loss with a dose of 2400 cGy in 8 sessions.  She is more concerned about her "sensitivity" to standard courses of radiation therapy.  Plan: Continue radiation therapy as planned.

## 2014-12-11 NOTE — Therapy (Signed)
[place], [place], [address] Phone: [phone]   Fax:  [phone]  Physical Therapy Treatment  Patient Details  Name: [name] [name] MRN: [id number] Date of Birth: [birth year] Referring Provider:  Chauncey Cruel, MD  Encounter Date: 12/11/2014      PT End of Session - 12/11/14 1325    Visit Number 70   Number of Visits 39   Date for PT Re-Evaluation 02/09/15   PT Start Time 5374   PT Stop Time 1104   PT Time Calculation (min) 41 min   Activity Tolerance Patient tolerated treatment well   Behavior During Therapy [facility] for tasks assessed/performed      Past Medical History  Diagnosis Date  . Seizures 2010    Isolated incident.  Marland Kitchen PONV (postoperative nausea and vomiting)   . Peripheral vascular disease 02/2010    blood clot related to porta cath  . Breast cancer dx'd 2005/2011  . Bone metastases dx'd 05/2014  . S/P radiation therapy 07/17/2014 through 08/02/2014     Left mediastinum, left seventh rib 3250 cGy in 13 sessions     Past Surgical History  Procedure Laterality Date  . Breast lumpectomy  2005  . Axillary lymph node dissection  Dec. 2011  . Portacath placement  12/11  . Removal portacath    . Mediastinotomy chamberlain mcneil Left 06/02/2013    Procedure: MEDIASTINOTOMY CHAMBERLAIN MCNEIL;  Surgeon: [name], MD;  Location: [facility];  Service: Thoracic;  Laterality: Left;  LEFT ANTERIOR MEDIASTINOTOMY     There were no vitals filed for this visit.  Visit Diagnosis:  Lymphedema  Muscle stiffness  Pain in joint involving right pelvic region and thigh      Subjective Assessment - 12/11/14 1025    Subjective Both feet and ankles are swelling; it started three days ago.   Currently in Pain? Yes   Pain Score 5    Pain Location Axilla  and left mid back; also ischial tuberosity area    Pain Orientation Right   Aggravating Factors  left back aggravated by sleeping position; ischial tuberosity by walking   Pain Relieving Factors motrin; massage and icing help ischial tuberosity area                         [facility] Adult PT Treatment/Exercise - 12/11/14 0001    Manual Therapy   Manual Lymphatic Drainage (MLD) In left sidelying, posterior interaxillary anastomosis and right axillo-inguinal anastomosis; in supine, short neck, left axilla and anterior interaxillary anastomosis, right groin and axillo-inguinal anastomosis, area between right breast scars, directing toward pathways.  In right sidelying, left periscapular area toward left groin.   Manual Therapy Other (comment)  In Rt. sidelying, soft tissue work right ischial tuberosity                        Long Term Clinic Goals - 12/11/14 1327    CC Long Term Goal  #3   Title Pain in area of right ischial tuberosity will be reduced to 1/10 or less, and patient will no longer need assistive device for ambulation.   Time 2   Period Weeks   Status New            Plan - 12/11/14 1326    Clinical Impression Statement Continuing to progress with pain in area of right ischial tuberosity; now noticing bilateral ankle swelling, and worries about  the cause of that.   Pt will benefit from skilled therapeutic intervention in order to improve on the following deficits Increased edema;Pain;Increased fascial restricitons   Rehab Potential Good   Clinical Impairments Affecting Rehab Potential active cancer   PT Frequency 2x / week   PT Duration 12 weeks   PT Treatment/Interventions Manual lymph drainage;Manual techniques   PT Next Visit Plan manual lymph drainage, soft tissue work; trial of electrical stimulation for pain relief if needed   Consulted and Agree with Plan of Care Patient        Problem List Patient Active Problem List   Diagnosis Date Noted  . Nausea with vomiting 11/18/2014   . Constipation 11/18/2014  . Left-sided thoracic back pain   . Bone metastases 11/16/2014  . Back pain 11/15/2014  . 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 Uncontrolled pain 11/14/2014  . 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 Chest wall pain 03/21/2014  . Abnormal LFTs (liver function tests) 09/12/2013  . Pleural effusion 08/18/2013  . 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

## 2014-12-12 ENCOUNTER — Other Ambulatory Visit: Payer: Self-pay | Admitting: Radiology

## 2014-12-12 ENCOUNTER — Ambulatory Visit: Payer: 59

## 2014-12-12 ENCOUNTER — Other Ambulatory Visit: Payer: 59

## 2014-12-12 ENCOUNTER — Inpatient Hospital Stay (HOSPITAL_COMMUNITY): Admission: RE | Admit: 2014-12-12 | Payer: 59 | Source: Ambulatory Visit

## 2014-12-12 ENCOUNTER — Ambulatory Visit
Admission: RE | Admit: 2014-12-12 | Discharge: 2014-12-12 | Disposition: A | Discharge status: Home / Self Care | Payer: 59 | Source: Ambulatory Visit | Attending: Radiation Oncology | Admitting: Radiation Oncology

## 2014-12-12 DIAGNOSIS — Z510 Encounter for antineoplastic radiation therapy: Secondary | ICD-10-CM | POA: Diagnosis not present

## 2014-12-13 ENCOUNTER — Other Ambulatory Visit: Payer: Self-pay | Admitting: Radiology

## 2014-12-13 ENCOUNTER — Ambulatory Visit: Payer: 59 | Admitting: Physical Therapy

## 2014-12-13 ENCOUNTER — Ambulatory Visit
Admission: RE | Admit: 2014-12-13 | Discharge: 2014-12-13 | Disposition: A | Discharge status: Home / Self Care | Payer: 59 | Source: Ambulatory Visit | Attending: Radiation Oncology | Admitting: Radiation Oncology

## 2014-12-13 DIAGNOSIS — M25551 Pain in right hip: Secondary | ICD-10-CM

## 2014-12-13 DIAGNOSIS — Z510 Encounter for antineoplastic radiation therapy: Secondary | ICD-10-CM | POA: Diagnosis not present

## 2014-12-13 DIAGNOSIS — M6289 Other specified disorders of muscle: Secondary | ICD-10-CM

## 2014-12-13 DIAGNOSIS — I890 Lymphedema, not elsewhere classified: Secondary | ICD-10-CM

## 2014-12-13 NOTE — Patient Instructions (Signed)
Try gentle, tolerable stretching of right hip adductors by long sitting on bed and spreading legs to the point of feeling a gentle stretch.  Hold 30 seconds and do twice a day.

## 2014-12-13 NOTE — Therapy (Signed)
[place], [place], [address] Phone: [phone]   Fax:  [phone]  Physical Therapy Treatment  Patient Details  Name: [name] [name] MRN: [id number] Date of Birth: 10-Mar-1961 Referring Provider:  Chauncey Cruel, MD  Encounter Date: 12/13/2014      PT End of Session - 12/13/14 1359    Visit Number 16   Number of Visits 48   Date for PT Re-Evaluation 02/09/15   PT Start Time 9622   PT Stop Time 1351   PT Time Calculation (min) 43 min   Activity Tolerance Patient tolerated treatment well   Behavior During Therapy [name] [name] for tasks assessed/performed      Past Medical History  Diagnosis Date  . Seizures 2010    Isolated incident.  Marland Kitchen PONV (postoperative nausea and vomiting)   . Peripheral vascular disease 02/2010    blood clot related to porta cath  . Breast cancer dx'd 2005/2011  . Bone metastases dx'd 05/2014  . S/P radiation therapy 07/17/2014 through 08/02/2014     Left mediastinum, left seventh rib 3250 cGy in 13 sessions     Past Surgical History  Procedure Laterality Date  . Breast lumpectomy  2005  . Axillary lymph node dissection  Dec. 2011  . Portacath placement  12/11  . Removal portacath    . Mediastinotomy chamberlain mcneil Left 06/02/2013    Procedure: MEDIASTINOTOMY CHAMBERLAIN MCNEIL;  Surgeon: [name] Nakayama, MD;  Location: [facility];  Service: Thoracic;  Laterality: Left;  LEFT ANTERIOR MEDIASTINOTOMY     There were no vitals filed for this visit.  Visit Diagnosis:  Lymphedema  Muscle stiffness  Pain in joint involving right pelvic region and thigh      Subjective Assessment - 12/13/14 1310    Subjective Kind of tired today.  Had a busy day yesterday.  Feet swell immediately when I put them down.  [name] [name] thinks it's my lack of activity, because it's a little in my hands and  feet.   Currently in Pain? Yes   Pain Score 5    Pain Location Axilla   Pain Orientation Right                         [facility] Adult PT Treatment/Exercise - 12/13/14 0001    Manual Therapy   Manual Lymphatic Drainage (MLD) In left sidelying, posterior interaxillary anastomosis and right axillo-inguinal anastomosis; in supine, short neck, left axilla and anterior interaxillary anastomosis, right groin and axillo-inguinal anastomosis, area between right breast scars, directing toward pathways.  In right sidelying, left periscapular area toward left groin.   Manual Therapy Other (comment)  On right side, soft tissue work right pubic/ischial tuberosi                        Long Term Clinic Goals - 12/13/14 1406    CC Long Term Goal  #1   Title Reports left upper back and left side pain is controlled at 4/10 or less with therapy at the current frequency.   Status On-going   CC Long Term Goal  #2   Title Pt. will report swelling is adequately managed to enable ADL function at a consistent level.   Status On-going   CC Long Term Goal  #3   Title Pain in area of right ischial tuberosity will be reduced to 1/10 or less, and patient will no longer need assistive device for  ambulation.   Status On-going            Plan - 12/13/14 1400    Clinical Impression Statement Patient continuing to make gradual progress with pain in area of right ischial tuberosity/pubic region.  Today a palpable cord was noted and this was the area of soreness the patient has been feeling.  She also had a moderate amount of fullness at right lateral breast area between scars that is lymphedematous.  Both benefited from treatment.  Patient is scheduled to have PleurX tube removed tomorrow so won't be seen again until next week.   Pt will benefit from skilled therapeutic intervention in order to improve on the following deficits Increased edema;Pain;Increased fascial restricitons   Rehab  Potential Good   Clinical Impairments Affecting Rehab Potential active cancer   PT Frequency 2x / week   PT Duration 12 weeks   PT Treatment/Interventions Manual lymph drainage;Manual techniques   PT Next Visit Plan manual lymph drainage, soft tissue work; trial of electrical stimulation for pain relief if needed   PT Home Exercise Plan try right hip adductor stretches by long sitting and spreading legs apart   Consulted and Agree with Plan of Care Patient   PT Plan Continue manual lymph drainage, soft tissue work.        Problem List Patient Active Problem List   Diagnosis Date Noted  . Nausea with vomiting 11/18/2014  . Constipation 11/18/2014  . Left-sided thoracic back pain   . Bone metastases 11/16/2014  . Back pain 11/15/2014  . 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 Uncontrolled pain 11/14/2014  . 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 Chest wall pain 03/21/2014  . Abnormal LFTs (liver function tests) 09/12/2013  . Pleural effusion 08/18/2013  . 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

## 2014-12-14 ENCOUNTER — Ambulatory Visit (HOSPITAL_COMMUNITY)
Admission: RE | Admit: 2014-12-14 | Discharge: 2014-12-14 | Disposition: A | Discharge status: Home / Self Care | Payer: 59 | Source: Ambulatory Visit | Attending: Interventional Radiology | Admitting: Interventional Radiology

## 2014-12-14 ENCOUNTER — Encounter (HOSPITAL_COMMUNITY): Payer: Self-pay

## 2014-12-14 ENCOUNTER — Ambulatory Visit: Payer: 59

## 2014-12-14 DIAGNOSIS — Z853 Personal history of malignant neoplasm of breast: Secondary | ICD-10-CM | POA: Insufficient documentation

## 2014-12-14 DIAGNOSIS — J910 Malignant pleural effusion: Secondary | ICD-10-CM | POA: Diagnosis not present

## 2014-12-14 DIAGNOSIS — Z4803 Encounter for change or removal of drains: Secondary | ICD-10-CM | POA: Insufficient documentation

## 2014-12-14 DIAGNOSIS — J90 Pleural effusion, not elsewhere classified: Secondary | ICD-10-CM

## 2014-12-14 LAB — BASIC METABOLIC PANEL
Anion gap: 9 (ref 5–15)
BUN: 6 mg/dL (ref 6–20)
CHLORIDE: 103 mmol/L (ref 101–111)
CO2: 28 mmol/L (ref 22–32)
Calcium: 9.6 mg/dL (ref 8.9–10.3)
Creatinine, Ser: 0.64 mg/dL (ref 0.44–1.00)
GFR calc Af Amer: >60 mL/min (ref >60)
GFR calc non Af Amer: >60 mL/min (ref >60)
Glucose, Bld: 100 mg/dL — ABNORMAL HIGH (ref 65–99)
POTASSIUM: 3.6 mmol/L (ref 3.5–5.1)
SODIUM: 140 mmol/L (ref 135–145)

## 2014-12-14 LAB — PROTIME-INR
INR: 1.12 (ref 0.00–1.49)
PROTHROMBIN TIME: 14.6 s (ref 11.6–15.2)

## 2014-12-14 LAB — CBC
HEMATOCRIT: 36 % (ref 36.0–46.0)
HEMOGLOBIN: 12.1 g/dL (ref 12.0–15.0)
MCH: 31.1 pg (ref 26.0–34.0)
MCHC: 33.6 g/dL (ref 30.0–36.0)
MCV: 92.5 fL (ref 78.0–100.0)
Platelets: 411 10*3/uL — ABNORMAL HIGH (ref 150–400)
RBC: 3.89 MIL/uL (ref 3.87–5.11)
RDW: 12.6 % (ref 11.5–15.5)
WBC: 6.7 10*3/uL (ref 4.0–10.5)

## 2014-12-14 LAB — APTT: aPTT: 28 seconds (ref 24–37)

## 2014-12-14 MED ORDER — CEFAZOLIN SODIUM-DEXTROSE 2-3 GM-% IV SOLR
INTRAVENOUS | Status: AC
  Filled 2014-12-14: qty 50

## 2014-12-14 MED ORDER — CEFAZOLIN SODIUM-DEXTROSE 2-3 GM-% IV SOLR
2.0000 g | INTRAVENOUS | Status: AC
  Administered 2014-12-14: 2 g via INTRAVENOUS

## 2014-12-14 MED ORDER — SODIUM CHLORIDE 0.9 % IV SOLN
Freq: Once | INTRAVENOUS | Status: AC
  Administered 2014-12-14: 08:00:00 via INTRAVENOUS

## 2014-12-14 MED ORDER — LIDOCAINE HCL 1 % IJ SOLN
INTRAMUSCULAR | Status: AC
  Filled 2014-12-14: qty 20

## 2014-12-14 MED ORDER — MIDAZOLAM HCL 2 MG/2ML IJ SOLN
INTRAMUSCULAR | Status: AC
  Filled 2014-12-14: qty 6

## 2014-12-14 MED ORDER — FENTANYL CITRATE (PF) 100 MCG/2ML IJ SOLN
INTRAMUSCULAR | Status: AC | PRN
  Administered 2014-12-14 (×2): 25 ug via INTRAVENOUS
  Administered 2014-12-14: 75 ug via INTRAVENOUS

## 2014-12-14 MED ORDER — HYDROCODONE-ACETAMINOPHEN 5-325 MG PO TABS: 1.0000 | ORAL_TABLET | ORAL | Status: DC | PRN

## 2014-12-14 MED ORDER — MIDAZOLAM HCL 2 MG/2ML IJ SOLN
INTRAMUSCULAR | Status: AC | PRN
  Administered 2014-12-14: 1 mg via INTRAVENOUS
  Administered 2014-12-14 (×2): 1.5 mg via INTRAVENOUS

## 2014-12-14 MED ORDER — FENTANYL CITRATE (PF) 100 MCG/2ML IJ SOLN
INTRAMUSCULAR | Status: AC
  Filled 2014-12-14: qty 4

## 2014-12-14 NOTE — H&P (Signed)
HPI: Patient with stage IV breast cancer with left tunneled PleurX catheter placed 09/01/13 that is not draining and no longer needed. She is here today for removal of catheter.    The patient has had a H&P performed within the last 30 days, all history, medications, and exam have been reviewed. The patient denies any interval changes since the H&P.  Medications: Prior to Admission medications   Medication Sig Start Date End Date Taking? Authorizing Provider  acetaminophen (TYLENOL) 325 MG tablet Take 2 tablets (650 mg total) by mouth every 12 (twelve) hours as needed for mild pain (or Fever >/= 101). 11/15/14  Yes Florencia Reasons, MD  ALPRAZolam Duanne Moron) 0.5 MG tablet Take 1 tablet (0.5 mg total) by mouth 2 (two) times daily as needed for anxiety. Patient taking differently: Take 0.125 mg by mouth 2 (two) times daily as needed for anxiety.  10/12/14  Yes Chauncey Cruel, MD  b complex vitamins capsule Take 1 capsule by mouth daily.   Yes Historical Provider, MD  bisacodyl (DULCOLAX) 10 MG suppository Place 1 suppository (10 mg total) rectally daily as needed for moderate constipation or severe constipation. 11/22/14  Yes Chauncey Cruel, MD  Calcium-Magnesium-Vitamin D (CITRACAL CALCIUM+D PO) Take 1 capsule by mouth daily. 400mg  vitamin C, 500mg  vitamin D3 2 PO DAILY   Yes Historical Provider, MD  Diphenhyd-Hydrocort-Nystatin (FIRST-DUKES MOUTHWASH) SUSP 5-10 ml qid SWISH AND SPIT 11/22/14  Yes Chauncey Cruel, MD  Ergocalciferol (VITAMIN D2) 2000 UNITS TABS Take 2,000 Units by mouth daily.   Yes Historical Provider, MD  folic acid (FOLVITE) 024 MCG tablet Take 800 mcg by mouth daily.   Yes Historical Provider, MD  glycerin adult 2 G SUPP Place 1 suppository rectally as needed for moderate constipation. 11/22/14  Yes Chauncey Cruel, MD  ibuprofen (ADVIL,MOTRIN) 200 MG tablet Take 400 mg by mouth every 4 (four) hours as needed for moderate pain.    Yes Historical Provider, MD  MAGNESIUM PO Take 1  tablet by mouth daily as needed (constipation).    Yes Historical Provider, MD  Melatonin 3 MG TABS Take 3 mg by mouth at bedtime.   Yes Historical Provider, MD  methocarbamol (ROBAXIN) 500 MG tablet Take 1 tablet (500 mg total) by mouth every 8 (eight) hours as needed for muscle spasms. 11/11/14  Yes Domenic Moras, PA-C  nystatin (MYCOSTATIN) 100000 UNIT/ML suspension Take 5 mLs (500,000 Units total) by mouth 4 (four) times daily. 11/28/14  Yes Chauncey Cruel, MD  omeprazole (PRILOSEC) 20 MG capsule Take 20 mg by mouth daily.   Yes Historical Provider, MD  ondansetron (ZOFRAN) 4 MG tablet Take 1 tablet (4 mg total) by mouth every 6 (six) hours as needed for nausea. 11/28/14  Yes Chauncey Cruel, MD  senna-docusate (SENOKOT-S) 8.6-50 MG per tablet Take 2 tablets by mouth 2 (two) times daily. 11/22/14  Yes Chauncey Cruel, MD  sucralfate (CARAFATE) 1 G tablet CRUSH AND DISSOLVE ONE TABLET IN 10 TO 15 ML OF WATER AND TK PO QID 11/30/14  Yes Historical Provider, MD  hydrOXYzine (ATARAX/VISTARIL) 25 MG tablet Take 1 tablet (25 mg total) by mouth 3 (three) times daily as needed for itching, anxiety, nausea or vomiting. Patient not taking: Reported on 12/05/2014 11/22/14   Chauncey Cruel, MD  oxyCODONE (ROXICODONE INTENSOL) 20 MG/ML concentrated solution Place 0.3-0.5 mLs (6-10 mg total) under the tongue every hour as needed for severe pain or breakthrough pain. 11/22/14   Chauncey Cruel, MD  Vital Signs: BP 152/92 mmHg  Pulse 103  Temp(Src) 98 F (36.7 C) (Oral)  Resp 18  Ht 5\' 4"  (1.626 m)  Wt 160 lb (72.576 kg)  BMI 27.45 kg/m2  SpO2 100%  Physical Exam  Constitutional: She is oriented to person, place, and time. No distress.  HENT:  Head: Normocephalic and atraumatic.  Cardiovascular: Regular rhythm.  Exam reveals no gallop and no friction rub.   No murmur heard. Tachycardic  Pulmonary/Chest: Effort normal and breath sounds normal. No respiratory distress. She has no wheezes. She has  no rales.  Neurological: She is alert and oriented to person, place, and time.  Skin: She is not diaphoretic.  Psychiatric:  Anxious    Mallampati Score:  MD Evaluation Airway: WNL Heart: WNL Abdomen: WNL Chest/ Lungs: WNL ASA  Classification: 3 Mallampati/Airway Score: Two  Labs:  CBC:  Recent Labs  11/19/14 0502 11/20/14 0433 11/27/14 0856 12/14/14 0800  WBC 9.2 7.4 7.3 6.7  HGB 12.0 10.9* 12.5 12.1  HCT 35.3* 32.1* 37.5 36.0  PLT 399 399 751* 411*    COAGS:  Recent Labs  12/14/14 0800  INR 1.12  APTT 28    BMP:  Recent Labs  11/17/14 1044 11/19/14 0502 11/20/14 0433 11/27/14 0856 12/14/14 0800  NA 129* 133* 133* 136 140  K 4.1 3.8 3.6 3.8 3.6  CL 96* 97* 98*  --  103  CO2 26 28 29 28 28   GLUCOSE 112* 112* 106* 65* 100*  BUN 7 7 7  8.1 6  CALCIUM 8.6* 8.6* 8.4* 9.8 9.6  CREATININE 0.47 0.59 0.56 0.8 0.64  GFRNONAA >60 >60 >60  --  >60  GFRAA >60 >60 >60  --  >60    LIVER FUNCTION TESTS:  Recent Labs  11/16/14 1550 11/19/14 0502 11/20/14 0433 11/27/14 0856  BILITOT 0.5 0.7 0.7 0.31  AST 25 50* 28 37*  ALT 34 90* 62* 76*  ALKPHOS 85 117 105 181*  PROT 6.6 6.2* 5.6* 7.1  ALBUMIN 3.5 2.8* 2.6* 3.1*    Assessment/Plan:  Stage IV breast cancer 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 Jana Hakim 11/28/14, scheduled today for removal of Pleurx catheter The patient has been NPO, no blood thinners taken, labs and vitals have been reviewed. Risks and Benefits discussed with the patient including, but not limited to bleeding, infection or pneumothorax. All of the patient's questions were answered, patient is agreeable to proceed. Consent signed and in chart.    SignedHedy Jacob 12/14/2014, 9:28 AM

## 2014-12-14 NOTE — Discharge Instructions (Signed)
Conscious Sedation, Adult, Care After Refer to this sheet in the next few weeks. These instructions provide you with information on caring for yourself after your procedure. Your health care provider may also give you more specific instructions. Your treatment has been planned according to current medical practices, but problems sometimes occur. Call your health care provider if you have any problems or questions after your procedure. WHAT TO EXPECT AFTER THE PROCEDURE  After your procedure:  You may feel sleepy, clumsy, and have poor balance for several hours.  Vomiting may occur if you eat too soon after the procedure. HOME CARE INSTRUCTIONS  Do not participate in any activities where you could become injured for at least 24 hours. Do not:  Drive.  Swim.  Ride a bicycle.  Operate heavy machinery.  Cook.  Use power tools.  Climb ladders.  Work from a high place.  Do not make important decisions or sign legal documents until you are improved.  If you vomit, drink water, juice, or soup when you can drink without vomiting. Make sure you have little or no nausea before eating solid foods.  Only take over-the-counter or prescription medicines for pain, discomfort, or fever as directed by your health care provider.  Make sure you and your family fully understand everything about the medicines given to you, including what side effects may occur.  You should not drink alcohol, take sleeping pills, or take medicines that cause drowsiness for at least 24 hours.  If you smoke, do not smoke without supervision.  If you are feeling better, you may resume normal activities 24 hours after you were sedated.  Keep all appointments with your health care provider. SEEK MEDICAL CARE IF:  Your skin is pale or bluish in color.  You continue to feel nauseous or vomit.  Your pain is getting worse and is not helped by medicine.  You have bleeding or swelling.  You are still sleepy or  feeling clumsy after 24 hours. SEEK IMMEDIATE MEDICAL CARE IF:  You develop a rash.  You have difficulty breathing.  You develop any type of allergic problem.  You have a fever. MAKE SURE YOU:  Understand these instructions.  Will watch your condition.  Will get help right away if you are not doing well or get worse. Document Released: 01/05/2013 Document Reviewed: 01/05/2013 [facility] Patient Information 2015 [place], [place]. This information is not intended to replace advice given to you by your health care provider. Make sure you discuss any questions you have with your health care provider.  Incision Care  An incision is a surgical cut to open your skin. You need to take care of your incision. This helps you to not get an infection. HOME CARE  Only take medicine as told by your doctor.  Do not take off your bandage (dressing) or get your incision wet until your doctor approves. Take Dressing off in 24 hours and replace with bandaid. May take shower when scab has formed over site. Take showers. Do not take baths, swim, or do anything that may soak your incision until it heals.  Return to your normal diet and activities as told or allowed by your doctor.  Avoid lifting any weight until your doctor approves.  Do not pick or scratch at your incision.  Drink enough fluids to keep your pee (urine) clear or pale yellow. GET HELP RIGHT AWAY IF:  You have a fever.  You have a rash.  You are dizzy, or you pass out (faint)  while standing.  You have trouble breathing.  You have a reaction or side effects to medicine given to you.  You have redness, puffiness (swelling), or more pain in the incision and medicine does not help.  You have fluid, blood, or yellowish-white fluid (pus) coming from the incision lasting over 1 day.  You have muscle aches, chills, or you feel sick.  You have a bad smell coming from the incision or bandage.  Your incision opens up after stitches,  staples, or sticky strips have been removed.  You keep feeling sick to your stomach (nauseous) or keep throwing up (vomiting). MAKE SURE YOU:   Understand these instructions.  Will watch your condition.  Will get help right away if you are not doing well or get worse. Document Released: 06/09/2011 Document Reviewed: 05/11/2013 [facility] Patient Information 2015 [place]. This information is not intended to replace advice given to you by your health care provider. Make sure you discuss any questions you have with your health care provider.

## 2014-12-14 NOTE — Procedures (Signed)
Left pleurex removed No complication No blood loss. See complete dictation in [facility].

## 2014-12-15 ENCOUNTER — Ambulatory Visit: Payer: 59

## 2014-12-15 ENCOUNTER — Encounter: Payer: Self-pay | Admitting: Physical Therapy

## 2014-12-18 ENCOUNTER — Ambulatory Visit
Admission: RE | Admit: 2014-12-18 | Discharge: 2014-12-18 | Disposition: A | Discharge status: Home / Self Care | Payer: 59 | Source: Ambulatory Visit | Attending: Radiation Oncology | Admitting: Radiation Oncology

## 2014-12-18 ENCOUNTER — Other Ambulatory Visit: Payer: Self-pay

## 2014-12-18 ENCOUNTER — Ambulatory Visit: Payer: 59 | Admitting: Physical Therapy

## 2014-12-18 DIAGNOSIS — C50211 Malignant neoplasm of upper-inner quadrant of right female breast: Secondary | ICD-10-CM

## 2014-12-18 DIAGNOSIS — M6289 Other specified disorders of muscle: Secondary | ICD-10-CM

## 2014-12-18 DIAGNOSIS — Z510 Encounter for antineoplastic radiation therapy: Secondary | ICD-10-CM | POA: Diagnosis not present

## 2014-12-18 DIAGNOSIS — I890 Lymphedema, not elsewhere classified: Secondary | ICD-10-CM

## 2014-12-18 DIAGNOSIS — C801 Malignant (primary) neoplasm, unspecified: Secondary | ICD-10-CM

## 2014-12-18 DIAGNOSIS — C7951 Secondary malignant neoplasm of bone: Secondary | ICD-10-CM

## 2014-12-18 DIAGNOSIS — C771 Secondary and unspecified malignant neoplasm of intrathoracic lymph nodes: Secondary | ICD-10-CM

## 2014-12-18 DIAGNOSIS — M25551 Pain in right hip: Secondary | ICD-10-CM

## 2014-12-18 NOTE — Progress Notes (Signed)
Weekly Management Note:  Site: Left parietal scalp/calvarium Current Dose:  1200  cGy Projected Dose: 2400  cGy  Narrative: The patient is seen today for routine under treatment assessment. CBCT/MVCT images/port films were reviewed. The chart was reviewed.   She is without complaints today although she did have a slight left-sided headache over the weekend which was very brief.  Physical Examination: There were no vitals filed for this visit..  Weight:  .  There is no palpable soft tissue swelling along the left parietal region.  There is no alopecia.  Impression: Tolerating radiation therapy well.  She will finish radiation therapy this Friday.  Plan: Continue radiation therapy as planned.  One-month follow-up visit after completion of radiation therapy.

## 2014-12-18 NOTE — Therapy (Signed)
[name] [name], [place], [address] Phone: [phone]   Fax:  [phone]  Physical Therapy Treatment  Patient Details  Name: [name] [name] MRN: [id number] Date of Birth: [birth year] Referring Provider:  Chauncey Cruel, MD  Encounter Date: 12/18/2014      PT End of Session - 12/18/14 1720    Visit Number 72   Number of Visits 55   Date for PT Re-Evaluation 02/09/15   PT Start Time 1018   PT Stop Time 1100   PT Time Calculation (min) 42 min   Activity Tolerance Patient tolerated treatment well   Behavior During Therapy [name] [name] for tasks assessed/performed      Past Medical History  Diagnosis Date  . Seizures 2010    Isolated incident.  Marland Kitchen PONV (postoperative nausea and vomiting)   . Peripheral vascular disease 02/2010    blood clot related to porta cath  . Breast cancer dx'd 2005/2011  . Bone metastases dx'd 05/2014  . S/P radiation therapy 07/17/2014 through 08/02/2014     Left mediastinum, left seventh rib 3250 cGy in 13 sessions     Past Surgical History  Procedure Laterality Date  . Breast lumpectomy  2005  . Axillary lymph node dissection  Dec. 2011  . Portacath placement  12/11  . Removal portacath    . Mediastinotomy chamberlain mcneil Left 06/02/2013    Procedure: MEDIASTINOTOMY CHAMBERLAIN MCNEIL;  Surgeon: [name] Nakayama, MD;  Location: [facility];  Service: Thoracic;  Laterality: Left;  LEFT ANTERIOR MEDIASTINOTOMY     There were no vitals filed for this visit.  Visit Diagnosis:  Lymphedema  Muscle stiffness  Pain in joint involving right pelvic region and thigh      Subjective Assessment - 12/18/14 1019    Subjective "I drove myself today."  "I'm officiallhy tubeless."   Currently in Pain? Yes   Pain Score 5   left at 2-3/10 and "inflamed"   Pain Location Axilla  also right ischial  tuberosity area at 4/10, which feels like a knot   Pain Orientation Right   Pain Descriptors / Indicators Other (Comment)  "really swollen"   Aggravating Factors  swelling increase at right axilla   Pain Relieving Factors massage                         [facility] Adult PT Treatment/Exercise - 12/18/14 0001    Manual Therapy   Manual Lymphatic Drainage (MLD) In left sidelying, posterior interaxillary anastomosis and right axillo-inguinal anastomosis; in supine, short neck, left axilla and anterior interaxillary anastomosis, right groin and axillo-inguinal anastomosis, area between right breast scars, and right upper arm, directing toward pathways.  In right sidelying, left periscapular area toward left groin.   Manual Therapy Other (comment)  On right side, soft tissue work right pubic/ischial tuberosi                        Long Term Clinic Goals - 12/13/14 1406    CC Long Term Goal  #1   Title Reports left upper back and left side pain is controlled at 4/10 or less with therapy at the current frequency.   Status On-going   CC Long Term Goal  #2   Title Pt. will report swelling is adequately managed to enable ADL function at a consistent level.   Status On-going   CC Long Term Goal  #3   Title Pain  in area of right ischial tuberosity will be reduced to 1/10 or less, and patient will no longer need assistive device for ambulation.   Status On-going            Plan - 12/18/14 1721    Clinical Impression Statement Patient had PleurX tube removed on Thursday last week and is doing fairly well since then.  Patient will plan to try Flexitouch pump again once her wound is healed from having the tube removed.  Still with pain at right pubic area, but decreasing.   Pt will benefit from skilled therapeutic intervention in order to improve on the following deficits Increased edema;Pain;Increased fascial restricitons   Rehab Potential Good   Clinical Impairments  Affecting Rehab Potential active cancer   PT Frequency 2x / week   PT Duration 12 weeks   PT Treatment/Interventions Manual lymph drainage;Manual techniques   PT Next Visit Plan manual lymph drainage, soft tissue work   Consulted and Agree with Plan of Care Patient        Problem List Patient Active Problem List   Diagnosis Date Noted  . Nausea with vomiting 11/18/2014  . Constipation 11/18/2014  . Left-sided thoracic back pain   . Bone metastases 11/16/2014  . Back pain 11/15/2014  . 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 Uncontrolled pain 11/14/2014  . 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 Chest wall pain 03/21/2014  . Abnormal LFTs (liver function tests) 09/12/2013  . Pleural effusion 08/18/2013  . 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

## 2014-12-19 ENCOUNTER — Ambulatory Visit
Admission: RE | Admit: 2014-12-19 | Discharge: 2014-12-19 | Disposition: A | Discharge status: Home / Self Care | Payer: 59 | Source: Ambulatory Visit | Attending: Radiation Oncology | Admitting: Radiation Oncology

## 2014-12-19 ENCOUNTER — Other Ambulatory Visit (HOSPITAL_BASED_OUTPATIENT_CLINIC_OR_DEPARTMENT_OTHER): Payer: 59

## 2014-12-19 DIAGNOSIS — C50211 Malignant neoplasm of upper-inner quadrant of right female breast: Secondary | ICD-10-CM

## 2014-12-19 DIAGNOSIS — C771 Secondary and unspecified malignant neoplasm of intrathoracic lymph nodes: Secondary | ICD-10-CM

## 2014-12-19 DIAGNOSIS — C801 Malignant (primary) neoplasm, unspecified: Secondary | ICD-10-CM

## 2014-12-19 DIAGNOSIS — Z510 Encounter for antineoplastic radiation therapy: Secondary | ICD-10-CM | POA: Diagnosis not present

## 2014-12-19 DIAGNOSIS — C7951 Secondary malignant neoplasm of bone: Secondary | ICD-10-CM | POA: Diagnosis not present

## 2014-12-19 LAB — COMPREHENSIVE METABOLIC PANEL (CC13)
ALBUMIN: 3.3 g/dL — AB (ref 3.5–5.0)
ALK PHOS: 141 U/L (ref 40–150)
ALT: 68 U/L — AB (ref 0–55)
ANION GAP: 8 meq/L (ref 3–11)
AST: 42 U/L — ABNORMAL HIGH (ref 5–34)
BILIRUBIN TOTAL: 0.33 mg/dL (ref 0.20–1.20)
BUN: 7.3 mg/dL (ref 7.0–26.0)
CALCIUM: 9.5 mg/dL (ref 8.4–10.4)
CHLORIDE: 103 meq/L (ref 98–109)
CO2: 28 mEq/L (ref 22–29)
CREATININE: 0.7 mg/dL (ref 0.6–1.1)
EGFR: >90 mL/min/{1.73_m2} (ref >90)
Glucose: 101 mg/dl (ref 70–140)
Potassium: 3.4 mEq/L — ABNORMAL LOW (ref 3.5–5.1)
Sodium: 139 mEq/L (ref 136–145)
TOTAL PROTEIN: 7 g/dL (ref 6.4–8.3)

## 2014-12-19 LAB — CBC WITH DIFFERENTIAL/PLATELET
BASO%: 0.3 % (ref 0.0–2.0)
Basophils Absolute: 0 10*3/uL (ref 0.0–0.1)
EOS%: 1.5 % (ref 0.0–7.0)
Eosinophils Absolute: 0.1 10*3/uL (ref 0.0–0.5)
HEMATOCRIT: 36.3 % (ref 34.8–46.6)
HEMOGLOBIN: 12.1 g/dL (ref 11.6–15.9)
LYMPH#: 0.4 10*3/uL — AB (ref 0.9–3.3)
LYMPH%: 5.9 % — ABNORMAL LOW (ref 14.0–49.7)
MCH: 30.9 pg (ref 25.1–34.0)
MCHC: 33.3 g/dL (ref 31.5–36.0)
MCV: 92.6 fL (ref 79.5–101.0)
MONO#: 0.5 10*3/uL (ref 0.1–0.9)
MONO%: 7.3 % (ref 0.0–14.0)
NEUT#: 5.2 10*3/uL (ref 1.5–6.5)
NEUT%: 85 % — AB (ref 38.4–76.8)
PLATELETS: 392 10*3/uL (ref 145–400)
RBC: 3.92 10*6/uL (ref 3.70–5.45)
RDW: 12.8 % (ref 11.2–14.5)
WBC: 6.1 10*3/uL (ref 3.9–10.3)

## 2014-12-20 ENCOUNTER — Ambulatory Visit (HOSPITAL_BASED_OUTPATIENT_CLINIC_OR_DEPARTMENT_OTHER): Payer: 59

## 2014-12-20 ENCOUNTER — Ambulatory Visit (HOSPITAL_BASED_OUTPATIENT_CLINIC_OR_DEPARTMENT_OTHER): Payer: 59 | Admitting: Oncology

## 2014-12-20 ENCOUNTER — Ambulatory Visit: Payer: 59 | Admitting: Physical Therapy

## 2014-12-20 ENCOUNTER — Telehealth: Payer: Self-pay | Admitting: Oncology

## 2014-12-20 ENCOUNTER — Ambulatory Visit
Admission: RE | Admit: 2014-12-20 | Discharge: 2014-12-20 | Disposition: A | Discharge status: Home / Self Care | Payer: 59 | Source: Ambulatory Visit | Attending: Radiation Oncology | Admitting: Radiation Oncology

## 2014-12-20 DIAGNOSIS — Z5111 Encounter for antineoplastic chemotherapy: Secondary | ICD-10-CM | POA: Diagnosis not present

## 2014-12-20 DIAGNOSIS — J90 Pleural effusion, not elsewhere classified: Secondary | ICD-10-CM

## 2014-12-20 DIAGNOSIS — I890 Lymphedema, not elsewhere classified: Secondary | ICD-10-CM

## 2014-12-20 DIAGNOSIS — R7989 Other specified abnormal findings of blood chemistry: Secondary | ICD-10-CM | POA: Diagnosis not present

## 2014-12-20 DIAGNOSIS — M6289 Other specified disorders of muscle: Secondary | ICD-10-CM

## 2014-12-20 DIAGNOSIS — R52 Pain, unspecified: Secondary | ICD-10-CM

## 2014-12-20 DIAGNOSIS — C771 Secondary and unspecified malignant neoplasm of intrathoracic lymph nodes: Secondary | ICD-10-CM | POA: Diagnosis not present

## 2014-12-20 DIAGNOSIS — C50211 Malignant neoplasm of upper-inner quadrant of right female breast: Secondary | ICD-10-CM

## 2014-12-20 DIAGNOSIS — M545 Low back pain, unspecified: Secondary | ICD-10-CM

## 2014-12-20 DIAGNOSIS — Z510 Encounter for antineoplastic radiation therapy: Secondary | ICD-10-CM | POA: Diagnosis not present

## 2014-12-20 DIAGNOSIS — R945 Abnormal results of liver function studies: Secondary | ICD-10-CM

## 2014-12-20 DIAGNOSIS — M25551 Pain in right hip: Secondary | ICD-10-CM

## 2014-12-20 MED ORDER — METOCLOPRAMIDE HCL 5 MG PO TABS: 5.0000 mg | ORAL_TABLET | Freq: Three times a day (TID) | ORAL | Status: DC

## 2014-12-20 MED ORDER — FULVESTRANT 250 MG/5ML IM SOLN
500.0000 mg | Freq: Once | INTRAMUSCULAR | Status: AC
  Administered 2014-12-20: 500 mg via INTRAMUSCULAR
  Filled 2014-12-20: qty 10

## 2014-12-20 NOTE — Therapy (Signed)
[place], [place], [address] Phone: [phone]   Fax:  [phone]  Physical Therapy Treatment  Patient Details  Name: [name] [name] MRN: [id number] Date of Birth: 03-09-1961 Referring [name] [name]:  [name] Cruel, MD  Encounter Date: 12/20/2014      PT End of Session - 12/20/14 1247    Visit Number 13   Number of Visits 88   Date for PT Re-Evaluation 02/09/15   PT Start Time 1150   PT Stop Time 1242   PT Time Calculation (min) 52 min   Activity Tolerance Patient tolerated treatment well   Behavior During Therapy [name] [name] for tasks assessed/performed      Past Medical History  Diagnosis Date  . Seizures 2010    Isolated incident.  Marland Kitchen PONV (postoperative nausea and vomiting)   . Peripheral vascular disease 02/2010    blood clot related to porta cath  . Breast cancer dx'd 2005/2011  . Bone metastases dx'd 05/2014  . S/P radiation therapy 07/17/2014 through 08/02/2014     Left mediastinum, left seventh rib 3250 cGy in 13 sessions     Past Surgical History  Procedure Laterality Date  . Breast lumpectomy  2005  . Axillary lymph node dissection  Dec. 2011  . Portacath placement  12/11  . Removal portacath    . Mediastinotomy chamberlain mcneil Left 06/02/2013    Procedure: MEDIASTINOTOMY CHAMBERLAIN MCNEIL;  Surgeon: [name] Nakayama, MD;  Location: [facility];  Service: Thoracic;  Laterality: Left;  LEFT ANTERIOR MEDIASTINOTOMY     There were no vitals filed for this visit.  Visit Diagnosis:  Lymphedema  Muscle stiffness  Pain in joint involving right pelvic region and thigh  Intermittent low back pain      Subjective Assessment - 12/20/14 1151    Subjective Had a bad night with the acid reflux--couldn't sleep.  Got Fulvestrant shot for first time today.  Will have it again in 2 weeks and 2 weeks later.    Currently in Pain? Yes   Pain Score 4   4-5 in lower back and ischial tuberosity area   Pain Location Axilla   Pain Orientation Right   Aggravating Factors  not sleeping well                         [facility] Adult PT Treatment/Exercise - 12/20/14 0001    Manual Therapy   Manual therapy comments Also soft tissue work to bilateral low back in right sidelying for muscle tightness and pain relief.   Manual Lymphatic Drainage (MLD) In left sidelying, posterior interaxillary anastomosis and right axillo-inguinal anastomosis; in supine, short neck, left axilla and anterior interaxillary anastomosis, right groin and axillo-inguinal anastomosis, area between right breast scars, directing toward pathways.  In right sidelying, left periscapular area toward left groin.   Manual Therapy Other (comment)  On right side, soft tissue work right pubic/ischial tuberosi                        Long Term Clinic Goals - 12/20/14 1250    CC Long Term Goal  #1   Title Reports left upper back and left side pain is controlled at 4/10 or less with therapy at the current frequency.   Status On-going   CC Long Term Goal  #2   Title Pt. will report swelling is adequately managed to enable ADL function at a consistent level.  Status On-going            Plan - 12/20/14 1247    Clinical Impression Statement Patient with worse pain the last 24 hours from reflux bothering her esophagus and from difficulty finding comfortable positions, so that she describes twisting her body in awkward ways that result in some soreness.  Started her new injections of Fulvestrant that it is hoped will destroy estrogen receptors to control the cancer.  Patient's wound from recent removal of PleurX tube is not yet healed; she plans to try Flexitouch once that is healed.   Pt will benefit from skilled therapeutic intervention in order to improve on the following deficits Increased edema;Pain;Increased  fascial restricitons   Rehab Potential Good   Clinical Impairments Affecting Rehab Potential active cancer   PT Frequency 2x / week   PT Duration 12 weeks   PT Treatment/Interventions Manual lymph drainage;Manual techniques   PT Next Visit Plan manual lymph drainage, soft tissue work   Consulted and Agree with Plan of Care Patient        Problem List Patient Active Problem List   Diagnosis Date Noted  . Nausea with vomiting 11/18/2014  . Constipation 11/18/2014  . Left-sided thoracic back pain   . Bone metastases 11/16/2014  . Back pain 11/15/2014  . 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 Uncontrolled pain 11/14/2014  . 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 Chest wall pain 03/21/2014  . Abnormal LFTs (liver function tests) 09/12/2013  . Pleural effusion 08/18/2013  . 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

## 2014-12-20 NOTE — Telephone Encounter (Signed)
Appointments made and avs printed for pateint °

## 2014-12-20 NOTE — Progress Notes (Signed)
[place]  Telephone:([phone] Fax:([phone]) [phone]     ID: [name] OB: [birth year]  MR#: [id number]  PPI#:[id number]  PCP: Pcp Not In System GYN:  [name] SU:  OTHER MD: [name], [name], [name], [name], [name]  CHIEF COMPLAINT: Stage IV breast cancer  CURRENT TREATMENT: Fulvestrant  BREAST CANCER HISTORY: From doctor [name]'s intake note 03/20/2004:  "The patient is a very pleasant 54 year old female, without significant past medical history.  Her family history is significant for a sister who at age 73 was diagnosed with invasive ductal carcinoma.  She is a breast cancer survivor at age 64 now.  The patient states that she has never really had a screening mammogram until October 2005, when she felt that it was time for her to start having mammograms done on a yearly basis.  Therefore, on 01/26/04, she underwent a screening mammogram and an abnormality was detected in the upper outer right breast.  She, therefore, underwent spot compression views of both the right and the left breast.  The left breast revealed a well-defined mass in the upper outer left quadrant, present at the 2 o'clock position, measuring 1.8 cm, 6 cm from the nipple.  This, by ultrasound, was felt to be a simple cyst measuring 1.8 cm.  On the right breast, a spiculated mass was noted in the upper outer right quadrant.  The ultrasound revealed a shadowing irregular solid mass at the 10:30 position, 9 cm from the nipple, measuring 1.2 cm in greatest dimension, correlating with the spiculated mass seen on the mammogram.  The right axilla was negative ultrasonically.  Because of this, the patient underwent a needle biopsy of the right breast and the biopsy was positive invasive mammary carcinoma that showed features consistent with a high-grade invasive ductal carcinoma associated with desmoplastic stroma.  No in situ component was seen  and no definite lymphovascular invasion was identified.  On the core biopsy, the tumor measured about 0.8 cm.  Because of this, she was seen by Dr. [name] and the patient was taken to the [facility] on March 15, 2004.  She underwent a right breast lumpectomy with sentinel node biopsy.  The final pathology revealed an invasive ductal carcinoma, measuring 1.7 cm, grade 2 of 3.  Margins were free of tumor.  Atypical lobular hyperplasia was noted.  One sentinel node was removed which was negative for metastatic disease.  The tumor was staged at T1c, N0 MX.  It was estrogen receptor positive, progesterone receptor positive.  HER-2/neu was 2+.  FISH was negative.  All margins were free of tumor.  She is now seen in Medical Oncology for further evaluation and management of this newly diagnosed T1c, node negative, stage I, invasive ductal carcinoma of the right breast."  Her subsequent history is as detailed below  INTERVAL HISTORY: Bradlee returns today for follow-up of her breast cancer accompanied by her husband [name]. She is still receiving radiation to her scalp lesion. She will complete that later this week. She is afraid that she will lose her hair and that section of her scalp and that probably will be the case. She is here to start fulvestrant today   REVIEW OF SYSTEMS: Arleth has developed significant problems with reflux. She is taking Prilosec daily but it is not controlling it. She has moved ahead of her bed up but that makes her back pain worse. The reflux causes her to start coughing whenever she eats  anything and the cough can go on for an hour at a time. She always very simple things as a result. She cannot eat anything that spicy. She is not constipated. The pain in the right leg is much better and she is no longer using any narcotics. She is using Motrin 2 tablets once a day. She does feel this irritates her esophagus. She has Carafate available but she refuses to use it because it "too much  medication". She has a sensation of a twisted muscle in the inner portion of her right buttock close to the groin. When she goes to physical therapy they put pressure and on twisted and that is helpful. But the rest of the time it makes her walk with a jerking the right leg. She finds it sitting on a hard chair is helpful. She has a very hard time sleeping. She only sleeps because she is so exhausted. She is using melatonin. She did use a little bit of Xanax yesterday and that was helpful. She has had no unusual headaches, no visual changes, and no nausea or vomiting. She does not feel short of breath, despite the cough. There has been no fever or bleeding. There has been no bladder issues. A detailed review of systems was otherwise stable  PAST MEDICAL HISTORY: Past Medical History  Diagnosis Date  . Seizures 2010    Isolated incident.  Marland Kitchen PONV (postoperative nausea and vomiting)   . Peripheral vascular disease 02/2010    blood clot related to porta cath  . Breast cancer dx'd 2005/2011  . Bone metastases dx'd 05/2014  . S/P radiation therapy 07/17/2014 through 08/02/2014     Left mediastinum, left seventh rib 3250 cGy in 13 sessions     PAST SURGICAL HISTORY: Past Surgical History  Procedure Laterality Date  . Breast lumpectomy  2005  . Axillary lymph node dissection  Dec. 2011  . Portacath placement  12/11  . Removal portacath    . Mediastinotomy chamberlain mcneil Left 06/02/2013    Procedure: MEDIASTINOTOMY CHAMBERLAIN MCNEIL;  Surgeon: [name], MD;  Location: [facility] OR;  Service: Thoracic;  Laterality: Left;  LEFT ANTERIOR MEDIASTINOTOMY     FAMILY HISTORY Family History  Problem Relation Age of Onset  . COPD Mother   . Breast cancer Sister 39   The patient's father is living, 28 years old as of may 2015. He lives in [place]. The patient's mother died from complications of COPD at the age of  42. These has 2 brothers, one sister. Her sister developed breast cancer at the age of 82. She is doing well. The patient herself underwent genetic testing at [facility] in 2011 and was found to be BRCA negative  GYNECOLOGIC HISTORY:  Menarche age 1, she is GX P0. She stopped having periods with her initial chemotherapy in 2006.  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 Her husband [name] is a Medical illustrator in [place]. He has a child from a prior marriage. At home they have 2 rescue dogs, Hobo and [place]eton. The patient is religious but not a church attender    ADVANCED DIRECTIVES: In place; at the 08/04/2014 visit in particular the patient was very clear, with her husband present, that she would not want any kind of feeding tubes or "other tubes" if her condition deteriorated.   HEALTH MAINTENANCE: Social History  Substance Use Topics  . Smoking status: Never Smoker   .  Smokeless tobacco: Never Used  . Alcohol Use: No     Colonoscopy:  PAP:  Bone density: March 2015; mild osteopenia  Lipid panel:  Allergies  Allergen Reactions  . Decadron [Dexamethasone] Other (See Comments)    Patient does not tolerate steroids.   . Dilaudid [Hydromorphone] Nausea And Vomiting  . Fluconazole Swelling    Liver toxicity  . Hydromorphone Hcl Nausea And Vomiting  . Morphine And Related Nausea And Vomiting  . Protonix [Pantoprazole Sodium] Other (See Comments)    Patient reports it caused thrush.  . Tegaderm Ag Mesh [Silver]     Current Outpatient Prescriptions  Medication Sig Dispense Refill  . acetaminophen (TYLENOL) 325 MG tablet Take 2 tablets (650 mg total) by mouth every 12 (twelve) hours as needed for mild pain (or Fever >/= 101). 24 tablet 0  . ALPRAZolam (XANAX) 0.5 MG tablet Take 1 tablet (0.5 mg total) by mouth 2 (two) times daily as needed for anxiety. (Patient taking differently: Take 0.125 mg by mouth 2 (two)  times daily as needed for anxiety. ) 30 tablet 0  . b complex vitamins capsule Take 1 capsule by mouth daily.    . bisacodyl (DULCOLAX) 10 MG suppository Place 1 suppository (10 mg total) rectally daily as needed for moderate constipation or severe constipation. 12 suppository 0  . Calcium-Magnesium-Vitamin D (CITRACAL CALCIUM+D PO) Take 1 capsule by mouth daily. 465m vitamin C, 5043mvitamin D3 2 PO DAILY    . Diphenhyd-Hydrocort-Nystatin (FIRST-DUKES MOUTHWASH) SUSP 5-10 ml qid SWISH AND SPIT 240 mL 3  . Ergocalciferol (VITAMIN D2) 2000 UNITS TABS Take 2,000 Units by mouth daily.    . folic acid (FOLVITE) 40619CG tablet Take 800 mcg by mouth daily.    . Marland Kitchenlycerin adult 2 G SUPP Place 1 suppository rectally as needed for moderate constipation. 12 each 0  . hydrOXYzine (ATARAX/VISTARIL) 25 MG tablet Take 1 tablet (25 mg total) by mouth 3 (three) times daily as needed for itching, anxiety, nausea or vomiting. (Patient not taking: Reported on 12/05/2014) 30 tablet 0  . ibuprofen (ADVIL,MOTRIN) 200 MG tablet Take 400 mg by mouth every 4 (four) hours as needed for moderate pain.     . Marland KitchenAGNESIUM PO Take 1 tablet by mouth daily as needed (constipation).     . Melatonin 3 MG TABS Take 3 mg by mouth at bedtime.    . methocarbamol (ROBAXIN) 500 MG tablet Take 1 tablet (500 mg total) by mouth every 8 (eight) hours as needed for muscle spasms. 20 tablet 0  . nystatin (MYCOSTATIN) 100000 UNIT/ML suspension Take 5 mLs (500,000 Units total) by mouth 4 (four) times daily. 240 mL 3  . omeprazole (PRILOSEC) 20 MG capsule Take 20 mg by mouth daily.    . ondansetron (ZOFRAN) 4 MG tablet Take 1 tablet (4 mg total) by mouth every 6 (six) hours as needed for nausea. 90 tablet 3  . oxyCODONE (ROXICODONE INTENSOL) 20 MG/ML concentrated solution Place 0.3-0.5 mLs (6-10 mg total) under the tongue every hour as needed for severe pain or breakthrough pain. 30 mL 0  . senna-docusate (SENOKOT-S) 8.6-50 MG per tablet Take 2 tablets  by mouth 2 (two) times daily. 100 tablet 3  . sucralfate (CARAFATE) 1 G tablet CRUSH AND DISSOLVE ONE TABLET IN 10 TO 15 ML OF WATER AND TK PO QID  1  . [DISCONTINUED] metoCLOPramide (REGLAN) 10 MG tablet Take 1 tablet (10 mg total) by mouth 4 (four) times daily -  before meals and  at bedtime. 60 tablet 3   No current facility-administered medications for this visit.    OBJECTIVE: Middle-aged white woman  Filed Vitals:     There is no weight on file to calculate BMI.   Filed Vitals:      Patient refused vitals again today (12/20/2014    ECOG FS:2 - Symptomatic, <50% confined to bed  Sclerae unicteric, EOMs intact Oropharynx clear, dentition in good repair No cervical or supraclavicular adenopathy Lungs no rales or rhonchi--there are breath sounds most of the way down the left lung, with no dullness to percussion Heart regular rate and rhythm Abd soft, nontender, positive bowel sounds MSK no focal spinal tenderness, no upper extremity lymphedema Neuro: nonfocal, well oriented, anxious affect Breasts: Deferred   LAB RESULTS:   CMP     Component Value Date/Time   NA 139 12/19/2014 1031   NA 140 12/14/2014 0800   K 3.4* 12/19/2014 1031   K 3.6 12/14/2014 0800   CL 103 12/14/2014 0800   CL 105 05/06/2012 1333   CO2 28 12/19/2014 1031   CO2 28 12/14/2014 0800   GLUCOSE 101 12/19/2014 1031   GLUCOSE 100* 12/14/2014 0800   GLUCOSE 124* 05/06/2012 1333   BUN 7.3 12/19/2014 1031   BUN 6 12/14/2014 0800   CREATININE 0.7 12/19/2014 1031   CREATININE 0.64 12/14/2014 0800   CALCIUM 9.5 12/19/2014 1031   CALCIUM 9.6 12/14/2014 0800   PROT 7.0 12/19/2014 1031   PROT 5.6* 11/20/2014 0433   ALBUMIN 3.3* 12/19/2014 1031   ALBUMIN 2.6* 11/20/2014 0433   AST 42* 12/19/2014 1031   AST 28 11/20/2014 0433   ALT 68* 12/19/2014 1031   ALT 62* 11/20/2014 0433   ALKPHOS 141 12/19/2014 1031   ALKPHOS 105 11/20/2014 0433   BILITOT 0.33 12/19/2014 1031   BILITOT 0.7 11/20/2014 0433    GFRNONAA >60 12/14/2014 0800   GFRAA >60 12/14/2014 0800    No results found for: SPEP  Lab Results  Component Value Date   WBC 6.1 12/19/2014   NEUTROABS 5.2 12/19/2014   HGB 12.1 12/19/2014   HCT 36.3 12/19/2014   MCV 92.6 12/19/2014   PLT 392 12/19/2014      Chemistry      Component Value Date/Time   NA 139 12/19/2014 1031   NA 140 12/14/2014 0800   K 3.4* 12/19/2014 1031   K 3.6 12/14/2014 0800   CL 103 12/14/2014 0800   CL 105 05/06/2012 1333   CO2 28 12/19/2014 1031   CO2 28 12/14/2014 0800   BUN 7.3 12/19/2014 1031   BUN 6 12/14/2014 0800   CREATININE 0.7 12/19/2014 1031   CREATININE 0.64 12/14/2014 0800      Component Value Date/Time   CALCIUM 9.5 12/19/2014 1031   CALCIUM 9.6 12/14/2014 0800   ALKPHOS 141 12/19/2014 1031   ALKPHOS 105 11/20/2014 0433   AST 42* 12/19/2014 1031   AST 28 11/20/2014 0433   ALT 68* 12/19/2014 1031   ALT 62* 11/20/2014 0433   BILITOT 0.33 12/19/2014 1031   BILITOT 0.7 11/20/2014 0433       Lab Results  Component Value Date   LABCA2 25 11/02/2007    No components found for: LABCA125   Recent Labs Lab 12/14/14 0800  INR 1.12    Urinalysis    Component Value Date/Time   COLORURINE YELLOW 11/17/2014 0143   APPEARANCEUR CLOUDY* 11/17/2014 0143   LABSPEC 1.010 11/17/2014 0143   LABSPEC 1.005 09/12/2013 [place]  6.5 11/17/2014 0143   PHURINE 6.0 09/12/2013 1542   GLUCOSEU NEGATIVE 11/17/2014 0143   GLUCOSEU Negative 09/12/2013 1542   HGBUR NEGATIVE 11/17/2014 0143   HGBUR Negative 09/12/2013 1542   BILIRUBINUR NEGATIVE 11/17/2014 0143   BILIRUBINUR Negative 09/12/2013 1542   KETONESUR NEGATIVE 11/17/2014 0143   KETONESUR Negative 09/12/2013 1542   PROTEINUR NEGATIVE 11/17/2014 0143   PROTEINUR Negative 09/12/2013 1542   UROBILINOGEN 0.2 11/17/2014 0143   UROBILINOGEN 0.2 09/12/2013 1542   NITRITE NEGATIVE 11/17/2014 0143   NITRITE Negative 09/12/2013 1542   LEUKOCYTESUR NEGATIVE 11/17/2014 0143    LEUKOCYTESUR Negative 09/12/2013 1542    STUDIES: Dg Abd 2 Views  11/24/2014   CLINICAL DATA:  Severe constipation, new narcotic regimen, no bowel movement since 11/21/2014. History of breast cancer.  EXAM: ABDOMEN - 2 VIEW  COMPARISON:  None.  FINDINGS: Bowel gas pattern is nonobstructive. Fairly large amount of stool is seen throughout the grossly nondistended colon compatible with given history of constipation. No evidence of soft tissue mass or abnormal fluid collection. No free intraperitoneal air.  Mild degenerative change is seen within the scoliotic thoracolumbar spine. No acute-appearing osseous abnormality. Airspace opacity at the left lung base is compatible with the effusion and pleural disease identified on recent PET-CT of 10/30/2014.  IMPRESSION: Fairly large amount of stool throughout the nondistended colon, consistent with given history of constipation.  Nonobstructive bowel gas pattern.   Electronically Signed   By: Franki Cabot M.D.   On: 11/24/2014 09:56   Ir Removal Of Plural Cath W/cuff  12/14/2014   CLINICAL DATA:  Breast carcinoma. Malignant left pleural effusion, post PleurX catheter placement. Partial resolution of loculated effusion with no further output from the catheter. Removal requested.  EXAM: EXAM TUNNELED LEFT PLEURAL CUFFED CATHETER REMOVAL  TECHNIQUE: The procedure, risks (including but not limited to bleeding, infection, organ damage ), benefits, and alternatives were explained to the patient. Questions regarding the procedure were encouraged and answered. The patient understands and consents to the procedure.  Intravenous Fentanyl and Versed were administered as conscious sedation during continuous cardiorespiratory monitoring by the radiology RN, with a total moderate sedation time of 18 minutes.  Overlying skin prepped with chlorhexidine, draped in usual sterile fashion, infiltrated locally with 1% lidocaine. The left PLEURX catheter was dissected free from the  underlying soft tissues and removed intact. Hemostasis was achieved. Site covered with Vaseline gauze and sterile gauze dressing. The patient tolerated the procedure well.  COMPLICATIONS: COMPLICATIONS None immediate  IMPRESSION: 1.  Technically successful tunneled left pleural catheter removal.   Electronically Signed   By: Lucrezia Europe M.D.   On: 12/14/2014 13:51    ASSESSMENT: 55 y.o. [place] woman with stage IV breast cancer, history as follows  (1)  S/p Right lumpectomy and sentinel lymph node sampling 03/15/2004 for a pT1c pN0. Stage IA invasive ductal carcinoma, grade 2, estrogen receptor 95% positive, progesterone receptor 65% positive, HER-2 not amplified; additional surgery 04/25/2004 for seroma or clearance showed no residual tumor  (2) adjuvant chemotherapy with cyclophosphamide and doxorubicin every 21 days x4 completed 07/19/2004  (3) adjuvant radiation given under Dr. Donella Stade in [place] completed July 2006  (4) the patient opted against adjuvant antiestrogen therapy  (5) genetics testing showed no BRCA mutations  (6) biopsy of a palpable right axillary mass 10/24/2009 showed invasive ductal carcinoma, grade 3, estrogen receptor 100% positive, progesterone receptor 2% positive (alert score 5) HER-2 negative; no evidence of systemic disease on PET scanning  (7) completed 3  of 4 planned cycles of docetaxel and cyclophosphamide September 2011, fourth cycle omitted because of marked elevations in liver function tests  (8) an right axillary lymph node dissection 03/06/2010 showed 3/8 lymph nodes removed to be involved by tumor, with extracapsular extension.  (9) 45 Gy radiation to the right axillary and right supraclavicular nodal areas, with capecitabine sensitization, completed March 2012   (10) intolerant of letrozole and exemestane; on tamoxifen with interruptions September 2012 to March 2013, but then continuing on tamoxifen more continuously through March of 2015  (11)  biopsy of mediastinal adenopathy 06/02/2013 shows invasive ductal carcinoma (gross cystic disease fluid protein positive, TTS-1 negative), estrogen receptor 80% positive, progesterone receptor 2% positive, HER-2 not amplified  (12) letrozole started March 2015-- tolerated with significant side effects, discontinued at the end of May 2015  (13) PET scan 08/16/2013 shows extensive left pleural metastatic disease and a large left pleural effusion that shifts cardiac and mediastinal structures to the right; adenopathy (celiac trunk, periadrenal, periaortic); and a left medial clavicular lesion; Status post left thoracentesis 08/16/2013 positive for adenocarcinoma, estrogen receptor positive, progesterone receptor negative.  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; everolimus was suggested as an available targeted agent  (18) exemestane started 03/31/2014, discontinued 10/31/2014 with evidence of progression  (a) everolimus added 04/03/2014 but not tolerated (cytopenias, elevated LFTs) even at minimal doses; stopped 04/17/2014  (19) fulvestrant to start 12/20/2014    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 treated with Lovenox for 5-6 months  (c) right upper extremity lymphedema--receiving physical therapy  (d) hepatic steatosis with chronically elevated LFTs as well as unusual hepatic sensitivity to  chemotherapy  (e) osteopenia with the lowest T score -1.6 on bone density scan 06/20/2013  (f) radiation oncology (Dr Valere Dross) has reviewed prior radiation records in case there is further mediastinal involvement with dysphagia etc in which case palliative XRT could be considered  (a) radiation to left mediastinum/ left 7th rib 3250 cGy in 13 sessions04/18/2016 through 08/02/2014  (b) radiation to T11 area: 22 Gy in 7 sessions, last dose 11/27/2014  (c) radiation left parietal scalp region to be completed 12/22/2014  (g) chest wall pain--improved post radiation treatments   PLAN: I spent approximately 50 minutes with [name] and [name] going over her situations. The biggest problem she is having right now is reflux. This wakes her up at night. It causes her paroxysms of cough. It is changing the way she eats and basically it is making her a perfectly miserable.  She is taking Prilosec 20 mg daily. Word doubling that to 20 mg every 12 hours. I have written for metoclopramide 5 mg for her to take 3 times a day before meals and at bedtime. We reviewed the possible toxicities, side effects and complications of this agent. She understands it does not cause constipation, if anything at tends to cause diarrhea. She also has Carafate which she may use if she wishes. She is very reluctant to use that or any other medication, but she  I think will improve with the simple maneuvers.  The pain in the bottom to the right she describes Jana Half twisted muscle. It improves with pressure, but of course its a very tough place to put pressure on. As she finds that when she sits on a hard chair it better. I suggested that perhaps she could have something to sit on in addition to the chair but she thinks she is dealing with that adequately. She is taking Motrin twice a day for the pain she does not want to take any other pain medication and anymore Motrin at this time.  She is very concerned about the liver tests but  I reassured her that they are stable and due to her hepatic steatosis. She was exceedingly anxious regarding the fulvestrant and she is correctly concerned that it may not work. On the other hand every other treatment we have is more invasive and has more side effects and also has no better chance of working giving her past history.  Accordingly we are starting the fulvestrant today. She should receive it in 2 weeks and 4 weeks then monthly and she is very reluctant to receive the one and month from now so she will see me that morning so we can discuss that further.  We do need to restage her we are setting her up for a CT of the chest next week. I will call her with those results as soon as they become available  She would like to be very proactive regarding pain issues. If we see something that may cause pain at some point in the future she would like it addressed sooner than later. This seems to me entirely reasonable.  Her quality of life is not very good at present. I do think she has received benefit from the radiation, however, and once we get through this reflux issues I hope she will regain some of the prior functional status  She knows to call for any problems that may develop before her next visit here.    Chauncey Cruel, MD   12/20/2014 8:14 AM

## 2014-12-21 ENCOUNTER — Encounter: Payer: Self-pay | Admitting: Radiation Oncology

## 2014-12-21 ENCOUNTER — Ambulatory Visit
Admission: RE | Admit: 2014-12-21 | Discharge: 2014-12-21 | Disposition: A | Discharge status: Home / Self Care | Payer: 59 | Source: Ambulatory Visit | Attending: Radiation Oncology | Admitting: Radiation Oncology

## 2014-12-21 DIAGNOSIS — Z510 Encounter for antineoplastic radiation therapy: Secondary | ICD-10-CM | POA: Diagnosis not present

## 2014-12-21 NOTE — Progress Notes (Signed)
Patient stopped in the breast center today following her radiation appt.  She had two questions for Dr. Jana Hakim which were addressed- she wanted to cancel her appt with the pulmonologist which Dr. Jana Hakim ok'd and wanted to know if she should get a flu shot which Dr. Jana Hakim said "yes" .  Patient stated understanding.

## 2014-12-21 NOTE — Progress Notes (Signed)
Weekly Management Note:  Site: Left parietal scalp/calvarium Current Dose:  2100  cGy Projected Dose: 2400  cGy  Narrative: The patient is seen today for routine under treatment assessment. CBCT/MVCT images/port films were reviewed. The chart was reviewed.   She seen today with just one treatment left to go to her left parietal scalp/calvarium.  She tells me she is had some nausea for which Dr. Jana Hakim suggested Prilosec.  She did take 1 dose of metoclopramide which caused diarrhea and she discontinued this.  She has had mild headaches, left greater than right.  Physical Examination: There were no vitals filed for this visit..  Weight:  .  There is no palpable mass along the left parietal region, nor is there hair loss.  Impression: Tolerating radiation therapy well.  It is possible she may have some inflammation of dura  adjacent to left parietal region metastasis which may be responsible for her mild left-sided headaches.  Plan: She will finish radiation therapy tomorrow.  One-month follow-up visit.

## 2014-12-22 ENCOUNTER — Ambulatory Visit: Payer: 59 | Admitting: Physical Therapy

## 2014-12-22 ENCOUNTER — Ambulatory Visit
Admission: RE | Admit: 2014-12-22 | Discharge: 2014-12-22 | Disposition: A | Discharge status: Home / Self Care | Payer: 59 | Source: Ambulatory Visit | Attending: Radiation Oncology | Admitting: Radiation Oncology

## 2014-12-22 DIAGNOSIS — Z510 Encounter for antineoplastic radiation therapy: Secondary | ICD-10-CM | POA: Diagnosis not present

## 2014-12-22 DIAGNOSIS — I890 Lymphedema, not elsewhere classified: Secondary | ICD-10-CM

## 2014-12-22 NOTE — Therapy (Signed)
[place], [place], [address] Phone: [phone]   Fax:  [phone]  Physical Therapy Treatment  Patient Details  Name: [name] [name] MRN: [id number] Date of Birth: 06/18/1960 Referring Provider:  Chauncey Cruel, MD  Encounter Date: 12/22/2014      PT End of Session - 12/22/14 1106    Visit Number 23   Number of Visits 88   Date for PT Re-Evaluation 02/09/15   PT Start Time 9179   PT Stop Time 1059   PT Time Calculation (min) 44 min   Activity Tolerance Patient tolerated treatment well   Behavior During Therapy [facility] for tasks assessed/performed      Past Medical History  Diagnosis Date  . Seizures 2010    Isolated incident.  Marland Kitchen PONV (postoperative nausea and vomiting)   . Peripheral vascular disease 02/2010    blood clot related to porta cath  . Breast cancer dx'd 2005/2011  . Bone metastases dx'd 05/2014  . S/P radiation therapy 07/17/2014 through 08/02/2014     Left mediastinum, left seventh rib 3250 cGy in 13 sessions     Past Surgical History  Procedure Laterality Date  . Breast lumpectomy  2005  . Axillary lymph node dissection  Dec. 2011  . Portacath placement  12/11  . Removal portacath    . Mediastinotomy chamberlain mcneil Left 06/02/2013    Procedure: MEDIASTINOTOMY CHAMBERLAIN MCNEIL;  Surgeon: [name], MD;  Location: [place];  Service: Thoracic;  Laterality: Left;  LEFT ANTERIOR MEDIASTINOTOMY     There were no vitals filed for this visit.  Visit Diagnosis:  Lymphedema      Subjective Assessment - 12/22/14 1102    Subjective Couldn't sleep last night due to reflux.  Also having problems with a dry cough that is worse when she tries to talk    Currently in Pain? Yes   Pain Score 4    Pain Location Axilla   Pain Orientation Right   Pain Descriptors / Indicators Heaviness    Pain Type Acute pain   Pain Onset More than a month ago   Pain Frequency Constant   Aggravating Factors  not sleeping well    Pain Relieving Factors manual lymph drainage                         [facility] Adult PT Treatment/Exercise - 12/22/14 0001    Manual Therapy   Manual Lymphatic Drainage (MLD) In left sidelying, posterior interaxillary anastomosis and right axillo-inguinal anastomosis; in supine, short neck, left axilla and anterior interaxillary anastomosis, right groin and axillo-inguinal anastomosis, area between right breast scars, directing toward pathways.  In right sidelying, left periscapular area toward left groin.                        [facility] Goals - 12/20/14 1250    CC Long Term Goal  #1   Title Reports left upper back and left side pain is controlled at 4/10 or less with therapy at the current frequency.   Status On-going   CC Long Term Goal  #2   Title Pt. will report swelling is adequately managed to enable ADL function at a consistent level.   Status On-going            Plan - 12/22/14 1107    Clinical Impression Statement Pt continues to have diffiuclty with reflux and right axilla  with visible and palpable fullness that deceased some with manual lymph drainage.  She felt some relief after treatment   Clinical Impairments Affecting Rehab Potential active cancer   PT Next Visit Plan manual lymph drainage, soft tissue work        Problem List Patient Active Problem List   Diagnosis Date Noted  . Nausea with vomiting 11/18/2014  . Constipation 11/18/2014  . Left-sided thoracic back pain   . Bone metastases 11/16/2014  . Back pain 11/15/2014  . 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 Uncontrolled pain 11/14/2014  . 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 Chest wall pain 03/21/2014  . Abnormal LFTs (liver function tests) 09/12/2013  . Pleural effusion 08/18/2013  . Breast  cancer of upper-inner quadrant of right female breast 08/18/2013  . 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 Owens Shark, PT  12/22/2014, 11:12 AM  [place], [place], [address] Phone: [phone]   Fax:  [phone]

## 2014-12-24 ENCOUNTER — Encounter: Payer: Self-pay | Admitting: Radiation Oncology

## 2014-12-24 NOTE — Progress Notes (Signed)
[facility] End of Treatment Note  Name:[name] [name]  Date: 12/24/2014 DEY:[id number] DOB:[birth year]   Status:outpatient    CC: Dr. Gunnar Bulla Magrinat  REFERRING PHYSICIAN: Dr. 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  She did have mild left-sided headaches, possibly related to inflammation of the adjacent dura.  There was palpable regression of her soft tissue mass.  I intended to give her 3000 cGy in 10 sessions, but she wanted to have less than my prescribed dose.                        PLAN: Routine followup in one month. Patient instructed to call if questions or worsening complaints in interim.

## 2014-12-25 ENCOUNTER — Ambulatory Visit: Payer: 59 | Admitting: Physical Therapy

## 2014-12-25 DIAGNOSIS — I890 Lymphedema, not elsewhere classified: Secondary | ICD-10-CM | POA: Diagnosis not present

## 2014-12-25 DIAGNOSIS — M25551 Pain in right hip: Secondary | ICD-10-CM

## 2014-12-25 DIAGNOSIS — M6289 Other specified disorders of muscle: Secondary | ICD-10-CM

## 2014-12-25 NOTE — Therapy (Signed)
[place], [place], [address] Phone: [phone]   Fax:  [phone]  Physical Therapy Treatment  Patient Details  Name: [name] [name] MRN: [id number] Date of Birth: 04-30-1960 Referring Provider:  Chauncey Cruel, MD  Encounter Date: 12/25/2014      PT End of Session - 12/25/14 1504    Visit Number 55   Number of Visits 19   Date for PT Re-Evaluation 02/09/15   PT Start Time 1020   PT Stop Time 1103   PT Time Calculation (min) 43 min   Activity Tolerance Patient tolerated treatment well   Behavior During Therapy [facility] for tasks assessed/performed      Past Medical History  Diagnosis Date  . Seizures 2010    Isolated incident.  Marland Kitchen PONV (postoperative nausea and vomiting)   . Peripheral vascular disease 02/2010    blood clot related to porta cath  . Breast cancer dx'd 2005/2011  . Bone metastases dx'd 05/2014  . S/P radiation therapy 07/17/2014 through 08/02/2014     Left mediastinum, left seventh rib 3250 cGy in 13 sessions     Past Surgical History  Procedure Laterality Date  . Breast lumpectomy  2005  . Axillary lymph node dissection  Dec. 2011  . Portacath placement  12/11  . Removal portacath    . Mediastinotomy chamberlain mcneil Left 06/02/2013    Procedure: MEDIASTINOTOMY CHAMBERLAIN MCNEIL;  Surgeon: [name], MD;  Location: [place];  Service: Thoracic;  Laterality: Left;  LEFT ANTERIOR MEDIASTINOTOMY     There were no vitals filed for this visit.  Visit Diagnosis:  Lymphedema  Muscle stiffness  Pain in joint involving right pelvic region and thigh      Subjective Assessment - 12/25/14 1020    Currently in Pain? Yes   Pain Score 5    Pain Location Axilla   Pain Orientation Right   Aggravating Factors  sitting up (inclined) to sleep   Pain Relieving Factors therapy helps  pain; Tums are helping GERD                         [facility] Adult PT Treatment/Exercise - 12/25/14 0001    Manual Therapy   Manual Lymphatic Drainage (MLD) In left sidelying, posterior interaxillary anastomosis and right axillo-inguinal anastomosis; in supine, short neck, left axilla and anterior interaxillary anastomosis, right groin and axillo-inguinal anastomosis, area between right breast scars, directing toward pathways.  In right sidelying, left periscapular area toward left groin.   Manual Therapy Other (comment)  On right side, soft tissue work right pubic/ischial tuberosi                        [facility] Goals - 12/20/14 1250    CC Long Term Goal  #1   Title Reports left upper back and left side pain is controlled at 4/10 or less with therapy at the current frequency.   Status On-going   CC Long Term Goal  #2   Title Pt. will report swelling is adequately managed to enable ADL function at a consistent level.   Status On-going            Plan - 12/25/14 1505    Clinical Impression Statement Patient still with an antalgic limp when she walks without using the cane; discussed that she may do better if she sees a pelvic pain specialist, so she agreed to  consider it if she is not better Friday this week.  She plans to try her Flexitouch pump again once the PleurX tube site is healed; currently it is no longer bandaged but is healed over.   Pt will benefit from skilled therapeutic intervention in order to improve on the following deficits Increased edema;Pain;Increased fascial restricitons   Rehab Potential Good   Clinical Impairments Affecting Rehab Potential active cancer   PT Frequency 2x / week   PT Duration 12 weeks   PT Treatment/Interventions Manual lymph drainage;Manual techniques   PT Next Visit Plan manual lymph drainage, soft tissue work   Consulted and Agree with Plan of Care Patient        Problem List Patient Active  Problem List   Diagnosis Date Noted  . Nausea with vomiting 11/18/2014  . Constipation 11/18/2014  . Left-sided thoracic back pain   . Bone metastases 11/16/2014  . Back pain 11/15/2014  . 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 Uncontrolled pain 11/14/2014  . 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 Chest wall pain 03/21/2014  . Abnormal LFTs (liver function tests) 09/12/2013  . Pleural effusion 08/18/2013  . 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 Secondary malignant neoplasm of mediastinal lymph node 08/18/2013    [name],[name] 12/25/2014, 3:08 PM  [place], [place], [address] Phone: [phone]   Fax:  [place], PT 12/25/2014 3:08 PM

## 2014-12-26 ENCOUNTER — Ambulatory Visit: Payer: 59

## 2014-12-26 ENCOUNTER — Ambulatory Visit (HOSPITAL_COMMUNITY)
Admission: RE | Admit: 2014-12-26 | Discharge: 2014-12-26 | Disposition: A | Discharge status: Home / Self Care | Payer: 59 | Source: Ambulatory Visit | Attending: Oncology | Admitting: Oncology

## 2014-12-26 ENCOUNTER — Encounter (HOSPITAL_COMMUNITY): Payer: Self-pay

## 2014-12-26 DIAGNOSIS — Z95828 Presence of other vascular implants and grafts: Secondary | ICD-10-CM

## 2014-12-26 DIAGNOSIS — R945 Abnormal results of liver function studies: Secondary | ICD-10-CM

## 2014-12-26 DIAGNOSIS — R7989 Other specified abnormal findings of blood chemistry: Secondary | ICD-10-CM | POA: Insufficient documentation

## 2014-12-26 DIAGNOSIS — R918 Other nonspecific abnormal finding of lung field: Secondary | ICD-10-CM | POA: Insufficient documentation

## 2014-12-26 DIAGNOSIS — J90 Pleural effusion, not elsewhere classified: Secondary | ICD-10-CM

## 2014-12-26 DIAGNOSIS — C50211 Malignant neoplasm of upper-inner quadrant of right female breast: Secondary | ICD-10-CM | POA: Diagnosis present

## 2014-12-26 DIAGNOSIS — R52 Pain, unspecified: Secondary | ICD-10-CM

## 2014-12-26 MED ORDER — HEPARIN SOD (PORK) LOCK FLUSH 100 UNIT/ML IV SOLN
500.0000 [IU] | Freq: Once | INTRAVENOUS | Status: DC
  Filled 2014-12-26: qty 5

## 2014-12-26 MED ORDER — SODIUM CHLORIDE 0.9 % IJ SOLN
10.0000 mL | INTRAMUSCULAR | Status: DC | PRN
  Filled 2014-12-26: qty 10

## 2014-12-26 MED ORDER — IOHEXOL 300 MG/ML  SOLN
75.0000 mL | Freq: Once | INTRAMUSCULAR | Status: AC | PRN
  Administered 2014-12-26: 75 mL via INTRAVENOUS

## 2014-12-26 NOTE — Patient Instructions (Signed)

## 2014-12-27 ENCOUNTER — Ambulatory Visit (HOSPITAL_BASED_OUTPATIENT_CLINIC_OR_DEPARTMENT_OTHER): Payer: 59 | Admitting: Oncology

## 2014-12-27 ENCOUNTER — Telehealth: Payer: Self-pay | Admitting: Oncology

## 2014-12-27 ENCOUNTER — Other Ambulatory Visit: Payer: Self-pay

## 2014-12-27 ENCOUNTER — Telehealth: Payer: Self-pay

## 2014-12-27 DIAGNOSIS — C50211 Malignant neoplasm of upper-inner quadrant of right female breast: Secondary | ICD-10-CM | POA: Diagnosis not present

## 2014-12-27 DIAGNOSIS — R7989 Other specified abnormal findings of blood chemistry: Secondary | ICD-10-CM

## 2014-12-27 DIAGNOSIS — C771 Secondary and unspecified malignant neoplasm of intrathoracic lymph nodes: Secondary | ICD-10-CM | POA: Diagnosis not present

## 2014-12-27 DIAGNOSIS — J90 Pleural effusion, not elsewhere classified: Secondary | ICD-10-CM | POA: Diagnosis not present

## 2014-12-27 DIAGNOSIS — R945 Abnormal results of liver function studies: Secondary | ICD-10-CM

## 2014-12-27 NOTE — Telephone Encounter (Signed)
Pt LMOVM - will be at clinic at 1030 - CT results.  Per Dr. Jana Hakim, pt to come in around 1 pm - he will have time to speak with her then.    Returned call to patient and let her know Dr. Jana Hakim will see her at 1 pm to go over CT results.  Pt voiced understanding.    POF placed by desk nurse.

## 2014-12-27 NOTE — Progress Notes (Signed)
[name] [name]  Telephone:(336) [phone] Fax:(336) [phone]     ID: [name] [name] OB: [birth year]  MR#: [id number]  ESP#:[id number]  [name]: [name] [name] GYN:  [name] [name] SU:  OTHER MD: [name] [name], [name] [name], [name] [name], [name] [name], [name] [name]  CHIEF COMPLAINT: Stage IV breast cancer  CURRENT TREATMENT: Fulvestrant  BREAST CANCER HISTORY: From doctor [name] [name]'s intake note 03/20/2004:  "The patient is a very pleasant 54 year old female, without significant past medical history.  Her family history is significant for a sister who at age 25 was diagnosed with invasive ductal carcinoma.  She is a breast cancer survivor at age 65 now.  The patient states that she has never really had a screening mammogram until October 2005, when she felt that it was time for her to start having mammograms done on a yearly basis.  Therefore, on 01/26/04, she underwent a screening mammogram and an abnormality was detected in the upper outer right breast.  She, therefore, underwent spot compression views of both the right and the left breast.  The left breast revealed a well-defined mass in the upper outer left quadrant, present at the 2 o'clock position, measuring 1.8 cm, 6 cm from the nipple.  This, by ultrasound, was felt to be a simple cyst measuring 1.8 cm.  On the right breast, a spiculated mass was noted in the upper outer right quadrant.  The ultrasound revealed a shadowing irregular solid mass at the 10:30 position, 9 cm from the nipple, measuring 1.2 cm in greatest dimension, correlating with the spiculated mass seen on the mammogram.  The right axilla was negative ultrasonically.  Because of this, the patient underwent a needle biopsy of the right breast and the biopsy was positive invasive mammary carcinoma that showed features consistent with a high-grade invasive ductal carcinoma associated with desmoplastic stroma.  No in situ component was seen  and no definite lymphovascular invasion was identified.  On the core biopsy, the tumor measured about 0.8 cm.  Because of this, she was seen by [name] [name] and the patient was taken to the [facility] on March 15, 2004.  She underwent a right breast lumpectomy with sentinel node biopsy.  The final pathology revealed an invasive ductal carcinoma, measuring 1.7 cm, grade 2 of 3.  Margins were free of tumor.  Atypical lobular hyperplasia was noted.  One sentinel node was removed which was negative for metastatic disease.  The tumor was staged at T1c, N0 MX.  It was estrogen receptor positive, progesterone receptor positive.  HER-2/neu was 2+.  FISH was negative.  All margins were free of tumor.  She is now seen in Medical Oncology for further evaluation and management of this newly diagnosed T1c, node negative, stage I, invasive ductal carcinoma of the right breast."  Her subsequent history is as detailed below  INTERVAL HISTORY: [name] [name] returns today for follow-up of her breast cancer accompanied by her husband [name] [name]. She had her first fulvestrant dose last week. Aside from the pain of the injection itself, she did remarkably well with that treatment. She had one hot flashes and that was unusual for her. She has not had any other side effects from the fulvestrant that she is aware of.  She is here today because she was very concerned about the CT scan just done.   REVIEW OF SYSTEMS: [name] [name] still has significant pain in a perirectal muscle area on the right. She is getting some physical therapy for this and  is already moving her right leg much better, with less discomfort. Nevertheless it has not quite resolved and she is going to need a referral to our pelvic physical therapy specialist. The esophagitis she had been experiencing is much improved and she is not taking Reglan or Carafate for it. She has significantly slowed down on her Advil as well which might have been making that problem worse. She still  has a tickle in her throat and a bit of a cough, which is dry, but no pain on swallowing. Bowel movements are "getting better", more regular, better formed, and less yellow. She is still significantly fatigued and has to take breaks frequently. She is not taking her walks anymore because of the muscle problem in the perineum. She has not had any fevers, rash, or bleeding problems. A detailed review of systems today was otherwise stable  PAST MEDICAL HISTORY: Past Medical History  Diagnosis Date  . Seizures 2010    Isolated incident.  Marland Kitchen PONV (postoperative nausea and vomiting)   . Peripheral vascular disease 02/2010    blood clot related to porta cath  . Breast cancer dx'd 2005/2011  . Bone metastases dx'd 05/2014  . S/P radiation therapy 07/17/2014 through 08/02/2014     Left mediastinum, left seventh rib 3250 cGy in 13 sessions     PAST SURGICAL HISTORY: Past Surgical History  Procedure Laterality Date  . Breast lumpectomy  2005  . Axillary lymph node dissection  Dec. 2011  . Portacath placement  12/11  . Removal portacath    . Mediastinotomy chamberlain mcneil Left 06/02/2013    Procedure: MEDIASTINOTOMY CHAMBERLAIN MCNEIL;  Surgeon: [name] Nakayama, MD;  Location: [facility] OR;  Service: Thoracic;  Laterality: Left;  LEFT ANTERIOR MEDIASTINOTOMY     FAMILY HISTORY Family History  Problem Relation Age of Onset  . COPD Mother   . Breast cancer Sister 37   The patient's father is living, 82 years old as of may 2015. He lives in [place]. The patient's mother died from complications of COPD at the age of 58. These has 2 brothers, one sister. Her sister developed breast cancer at the age of 35. She is doing well. The patient herself underwent genetic testing at [facility] in 2011 and was found to be BRCA negative  GYNECOLOGIC HISTORY:  Menarche age 74, she is GX P0. She stopped having periods with her initial  chemotherapy in 2006.  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 Her husband [name] [name] is a Medical illustrator in [place]. He has a child from a prior marriage. At home they have 2 rescue dogs, Hobo and [place]. The patient is religious but not a church attender    ADVANCED DIRECTIVES: In place; at the 08/04/2014 visit in particular the patient was very clear, with her husband present, that she would not want any kind of feeding tubes or "other tubes" if her condition deteriorated.   HEALTH MAINTENANCE: Social History  Substance Use Topics  . Smoking status: Never Smoker   . Smokeless tobacco: Never Used  . Alcohol Use: No     Colonoscopy:  PAP:  Bone density: March 2015; mild osteopenia  Lipid panel:  Allergies  Allergen Reactions  . Decadron [Dexamethasone] Other (See Comments)    Patient does not tolerate steroids.   . Dilaudid [Hydromorphone] Nausea And Vomiting  . Fluconazole Swelling    Liver toxicity  . Hydromorphone Hcl Nausea And  Vomiting  . Morphine And Related Nausea And Vomiting  . Protonix [Pantoprazole Sodium] Other (See Comments)    Patient reports it caused thrush.  . Tegaderm Ag Mesh [Silver]     Current Outpatient Prescriptions  Medication Sig Dispense Refill  . ALPRAZolam (XANAX) 0.5 MG tablet Take 1 tablet (0.5 mg total) by mouth 2 (two) times daily as needed for anxiety. (Patient taking differently: Take 0.125 mg by mouth 2 (two) times daily as needed for anxiety. ) 30 tablet 0  . ibuprofen (ADVIL,MOTRIN) 200 MG tablet Take 400 mg by mouth every 4 (four) hours as needed for moderate pain.     . Melatonin 3 MG TABS Take 3 mg by mouth at bedtime.    . metoCLOPramide (REGLAN) 5 MG tablet Take 1 tablet (5 mg total) by mouth 4 (four) times daily -  before meals and at bedtime. 60 tablet 12  . omeprazole (PRILOSEC) 20 MG capsule Take 20 mg by mouth 2 (two) times daily at 10 AM and 5 PM.    .  sucralfate (CARAFATE) 1 G tablet CRUSH AND DISSOLVE ONE TABLET IN 10 TO 15 ML OF WATER AND TK PO QID  1   No current facility-administered medications for this visit.    OBJECTIVE: Middle-aged white woman  There were no vitals filed for this visit.   There is no weight on file to calculate BMI.   There were no vitals filed for this visit.    Patient refused vitals again today (12/27/2014)    ECOG FS:2 - Symptomatic, <50% confined to bed  Sclerae unicteric, pupils round and equal Oropharynx clear and moist-- no thrush or other lesions No cervical or supraclavicular adenopathy Lungs no rales or rhonchi, decreased excursion on the left as previously noted Heart regular rate and rhythm Abd soft, nontender, positive bowel sounds MSK no focal spinal tenderness, no upper extremity lymphedema; normal hip flexion bilaterally; walks with a slight limp Neuro: nonfocal, well oriented, appropriate affect Breasts: Deferred    LAB RESULTS:   CMP     Component Value Date/Time   NA 139 12/19/2014 1031   NA 140 12/14/2014 0800   K 3.4* 12/19/2014 1031   K 3.6 12/14/2014 0800   CL 103 12/14/2014 0800   CL 105 05/06/2012 1333   CO2 28 12/19/2014 1031   CO2 28 12/14/2014 0800   GLUCOSE 101 12/19/2014 1031   GLUCOSE 100* 12/14/2014 0800   GLUCOSE 124* 05/06/2012 1333   BUN 7.3 12/19/2014 1031   BUN 6 12/14/2014 0800   CREATININE 0.7 12/19/2014 1031   CREATININE 0.64 12/14/2014 0800   CALCIUM 9.5 12/19/2014 1031   CALCIUM 9.6 12/14/2014 0800   PROT 7.0 12/19/2014 1031   PROT 5.6* 11/20/2014 0433   ALBUMIN 3.3* 12/19/2014 1031   ALBUMIN 2.6* 11/20/2014 0433   AST 42* 12/19/2014 1031   AST 28 11/20/2014 0433   ALT 68* 12/19/2014 1031   ALT 62* 11/20/2014 0433   ALKPHOS 141 12/19/2014 1031   ALKPHOS 105 11/20/2014 0433   BILITOT 0.33 12/19/2014 1031   BILITOT 0.7 11/20/2014 0433   GFRNONAA >60 12/14/2014 0800   GFRAA >60 12/14/2014 0800    No results found for: SPEP  Lab  Results  Component Value Date   WBC 6.1 12/19/2014   NEUTROABS 5.2 12/19/2014   HGB 12.1 12/19/2014   HCT 36.3 12/19/2014   MCV 92.6 12/19/2014   PLT 392 12/19/2014      Chemistry      Component  Value Date/Time   NA 139 12/19/2014 1031   NA 140 12/14/2014 0800   K 3.4* 12/19/2014 1031   K 3.6 12/14/2014 0800   CL 103 12/14/2014 0800   CL 105 05/06/2012 1333   CO2 28 12/19/2014 1031   CO2 28 12/14/2014 0800   BUN 7.3 12/19/2014 1031   BUN 6 12/14/2014 0800   CREATININE 0.7 12/19/2014 1031   CREATININE 0.64 12/14/2014 0800      Component Value Date/Time   CALCIUM 9.5 12/19/2014 1031   CALCIUM 9.6 12/14/2014 0800   ALKPHOS 141 12/19/2014 1031   ALKPHOS 105 11/20/2014 0433   AST 42* 12/19/2014 1031   AST 28 11/20/2014 0433   ALT 68* 12/19/2014 1031   ALT 62* 11/20/2014 0433   BILITOT 0.33 12/19/2014 1031   BILITOT 0.7 11/20/2014 0433       Lab Results  Component Value Date   LABCA2 25 11/02/2007    No components found for: MOLMB867  No results for input(s): INR in the last 168 hours.  Urinalysis    Component Value Date/Time   COLORURINE YELLOW 11/17/2014 0143   APPEARANCEUR CLOUDY* 11/17/2014 0143   LABSPEC 1.010 11/17/2014 0143   LABSPEC 1.005 09/12/2013 1542   PHURINE 6.5 11/17/2014 0143   PHURINE 6.0 09/12/2013 1542   GLUCOSEU NEGATIVE 11/17/2014 0143   GLUCOSEU Negative 09/12/2013 1542   HGBUR NEGATIVE 11/17/2014 0143   HGBUR Negative 09/12/2013 1542   BILIRUBINUR NEGATIVE 11/17/2014 0143   BILIRUBINUR Negative 09/12/2013 1542   KETONESUR NEGATIVE 11/17/2014 0143   KETONESUR Negative 09/12/2013 1542   PROTEINUR NEGATIVE 11/17/2014 0143   PROTEINUR Negative 09/12/2013 1542   UROBILINOGEN 0.2 11/17/2014 0143   UROBILINOGEN 0.2 09/12/2013 1542   NITRITE NEGATIVE 11/17/2014 0143   NITRITE Negative 09/12/2013 1542   LEUKOCYTESUR NEGATIVE 11/17/2014 0143   LEUKOCYTESUR Negative 09/12/2013 1542    STUDIES: Ct Chest W Contrast  12/26/2014    CLINICAL DATA:  Restaging breast cancer. Stage IV breast cancer with lung Mets and lymph node metastasis.  EXAM: CT CHEST WITH CONTRAST  TECHNIQUE: Multidetector CT imaging of the chest was performed during intravenous contrast administration.  CONTRAST:  46m OMNIPAQUE IOHEXOL 300 MG/ML  SOLN  COMPARISON:  PET-CT scan 10/30/2014, CT scan 10/12/2014  FINDINGS: Mediastinum/Nodes: Enlarged LEFT axial lymph node measures 11 mm increased from 8 mm on comparison CT scan. No supraclavicular lymphadenopathy. No mediastinal lymphadenopathy. No pericardial fluid. Esophagus normal.  Lungs/Pleura: There is pleural thickening at the LEFT lung base. This is hypermetabolic on comparison PET-CT scan. There is nodular extension into the neural foramina at T11 on the LEFT. This degree of nodularity is decreased slightly measures 17 mm image 42, series 2 compared to 26 mm on prior.  The nodular thickening at the inferior pleural space is similar to slightly decreased. Multiple nodules in the precordial space are similar to comparison exam (image 38, series 2).  There are rounded atelectasis at the LEFT lung base. There is volume loss in the LEFT hemi thorax. There is a consolidation with air bronchograms in the upper lobes suggesting radiation change.  RIGHT lung demonstrates a nodule in the upper lobe on image 23, series 5 which is new from comparison exam.  Upper abdomen: Several low-density lesions within the liver. One measures 8 mm on image 48, series 2 a second RIGHT hepatic lobe measures 7 mm on image 55. These are not seen on comparison exam although. And retroverted SPECT on comparison PET-CT scan there subtle activity at these  locations.  Nodular large round along the LEFT crus of the diaphragm measuring 2.5 cm appears slightly advanced in thickness.  Musculoskeletal: No aggressive osseous lesions identified.  IMPRESSION: 1. Overall pattern favors progression of breast cancer metastasis. 2. Two new small lesions in the  RIGHT hepatic lobe are concerning for hepatic metastasis. 3. New nodule in the RIGHT upper lobe. 4. Nodular pleural thickening in the LEFT hemi thorax is similar prior. 5. Slight enlargement of LEFT axillary nodal metastasis. 6. Paraspinal lesion on the LEFT at T11 is slightly decreased. 7. Thickening along the LEFT crus of the diaphragm appears slightly more prominent.   Electronically Signed   By: Suzy Bouchard M.D.   On: 12/26/2014 17:19   Ir Removal Of Plural Cath W/cuff  12/14/2014   CLINICAL DATA:  Breast carcinoma. Malignant left pleural effusion, post PleurX catheter placement. Partial resolution of loculated effusion with no further output from the catheter. Removal requested.  EXAM: EXAM TUNNELED LEFT PLEURAL CUFFED CATHETER REMOVAL  TECHNIQUE: The procedure, risks (including but not limited to bleeding, infection, organ damage ), benefits, and alternatives were explained to the patient. Questions regarding the procedure were encouraged and answered. The patient understands and consents to the procedure.  Intravenous Fentanyl and Versed were administered as conscious sedation during continuous cardiorespiratory monitoring by the radiology RN, with a total moderate sedation time of 18 minutes.  Overlying skin prepped with chlorhexidine, draped in usual sterile fashion, infiltrated locally with 1% lidocaine. The left PLEURX catheter was dissected free from the underlying soft tissues and removed intact. Hemostasis was achieved. Site covered with Vaseline gauze and sterile gauze dressing. The patient tolerated the procedure well.  COMPLICATIONS: COMPLICATIONS None immediate  IMPRESSION: 1.  Technically successful tunneled left pleural catheter removal.   Electronically Signed   By: Lucrezia Europe M.D.   On: 12/14/2014 13:51    ASSESSMENT: 54 y.o. [place] woman with stage IV breast cancer, history as follows  (1)  S/p Right lumpectomy and sentinel lymph node sampling 03/15/2004 for a pT1c pN0. Stage  IA invasive ductal carcinoma, grade 2, estrogen receptor 95% positive, progesterone receptor 65% positive, HER-2 not amplified; additional surgery 04/25/2004 for seroma or clearance showed no residual tumor  (2) adjuvant chemotherapy with cyclophosphamide and doxorubicin every 21 days x4 completed 07/19/2004  (3) adjuvant radiation given under Dr. Donella Stade in [place] completed July 2006  (4) the patient opted against adjuvant antiestrogen therapy  (5) genetics testing showed no BRCA mutations  (6) biopsy of a palpable right axillary mass 10/24/2009 showed invasive ductal carcinoma, grade 3, estrogen receptor 100% positive, progesterone receptor 2% positive (alert score 5) HER-2 negative; no evidence of systemic disease on PET scanning  (7) completed 3 of 4 planned cycles of docetaxel and cyclophosphamide September 2011, fourth cycle omitted because of marked elevations in liver function tests  (8) an right axillary lymph node dissection 03/06/2010 showed 3/8 lymph nodes removed to be involved by tumor, with extracapsular extension.  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, TTS-1 negative), estrogen receptor 80% positive, progesterone receptor 2% positive, HER-2 not amplified  (12) letrozole started March 2015-- tolerated with significant side effects, discontinued at the end  of May 2015  (13) PET scan 08/16/2013 shows extensive left pleural metastatic disease and a large left pleural effusion that shifts cardiac and mediastinal structures to the right; adenopathy (celiac trunk, periadrenal,  periaortic); and a left medial clavicular lesion; Status post left thoracentesis 08/16/2013 positive for adenocarcinoma, estrogen receptor positive, progesterone receptor negative.  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; everolimus was suggested as an available targeted agent  (18) exemestane started 03/31/2014, discontinued 10/31/2014 with evidence of progression  (a) everolimus added 04/03/2014 but not tolerated (cytopenias, elevated LFTs) even at minimal doses; stopped 04/17/2014  (19) fulvestrant started 12/20/2014    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 treated with Lovenox for 5-6 months  (c) right upper extremity lymphedema--receiving physical therapy  (d) hepatic steatosis with chronically elevated LFTs as well as unusual hepatic sensitivity to chemotherapy  (e) osteopenia with the lowest T score -1.6 on bone density scan 06/20/2013  (f) radiation oncology (Dr Valere Dross) has reviewed prior radiation records in case there is further mediastinal involvement with dysphagia etc in which case palliative XRT could be considered  (a) radiation to left mediastinum/ left 7th rib 3250 cGy in 13 sessions04/18/2016 through 08/02/2014  (b) radiation to T11 area: 22 Gy in 7 sessions, last dose 11/27/2014  (c) radiation left parietal scalp region to be completed 12/22/2014  (g) chest wall  pain--improved post radiation treatments   PLAN: Clinically Luria is doing considerably better than the last visit. The chief reason for the improvement is just that she is healing from the recent radiation treatments. She tolerated the fulvestrant well and that his very encouraging. I'm hopeful she will be able to continue on at least that medication even if we cannot add Palbociclib. She will have a repeat PET scan in mid December to assess for disease stability or response.  She still has the perirectal rectal/perineal pain, which is worse on weightbearing on the right leg. She might benefit from additional physical therapy and I have placed a referral to [name] to help [place] with that.  We discussed the CT scan extensively. It again confirms that her cancer is growing very slowly. She has essentially had no treatment for quite a while and there is minimal measurable increase. She does have 2 very small spots in the liver. I reassured her that this would not compromise her liver function and of course what is of more concern is the lung situation. She understands that one can live well with one normal lungs. The problem would be if the cancer starts invading the right lung.  She wanted to know how long I anticipated her to live and of course I cannot answer that question but I don't see anything that is immediately life-threatening on this scan  After all this discussion she felt reassured. She will return next week for her second fulvestrant dose and then she will see me again on the 19th for her third. From that point she will start receiving the fulvestrant on a monthly  basis. At the next visit I will see if she would like to take Palbociclib even if it's only once a week or once every third day as a trial.   Chauncey Cruel, MD   12/27/2014 1:43 PM

## 2014-12-27 NOTE — Telephone Encounter (Signed)
Dr Jana Hakim appointment scheduled,patient has not checked out yet so no avs printed

## 2014-12-29 ENCOUNTER — Ambulatory Visit: Payer: 59 | Admitting: Physical Therapy

## 2014-12-29 DIAGNOSIS — I890 Lymphedema, not elsewhere classified: Secondary | ICD-10-CM | POA: Diagnosis not present

## 2014-12-29 DIAGNOSIS — M25551 Pain in right hip: Secondary | ICD-10-CM

## 2014-12-29 DIAGNOSIS — M6289 Other specified disorders of muscle: Secondary | ICD-10-CM

## 2014-12-29 NOTE — Therapy (Signed)
[place], [place], [address] Phone: [phone]   Fax:  [phone]  Physical Therapy Treatment  Patient Details  Name: [name] [name] MRN: [id number] Date of Birth: 1960/05/09 Referring Provider:  Chauncey Cruel, MD  Encounter Date: 12/29/2014      PT End of Session - 12/29/14 1235    Visit Number 46   Number of Visits 88   Date for PT Re-Evaluation 02/09/15   PT Start Time 9381   PT Stop Time 1106   PT Time Calculation (min) 43 min   Activity Tolerance Patient tolerated treatment well   Behavior During Therapy [facility] for tasks assessed/performed      Past Medical History  Diagnosis Date  . Seizures 2010    Isolated incident.  Marland Kitchen PONV (postoperative nausea and vomiting)   . Peripheral vascular disease 02/2010    blood clot related to porta cath  . Breast cancer dx'd 2005/2011  . Bone metastases dx'd 05/2014  . S/P radiation therapy 07/17/2014 through 08/02/2014     Left mediastinum, left seventh rib 3250 cGy in 13 sessions     Past Surgical History  Procedure Laterality Date  . Breast lumpectomy  2005  . Axillary lymph node dissection  Dec. 2011  . Portacath placement  12/11  . Removal portacath    . Mediastinotomy chamberlain mcneil Left 06/02/2013    Procedure: MEDIASTINOTOMY CHAMBERLAIN MCNEIL;  Surgeon: [name], MD;  Location: [place];  Service: Thoracic;  Laterality: Left;  LEFT ANTERIOR MEDIASTINOTOMY     There were no vitals filed for this visit.  Visit Diagnosis:  Lymphedema  Muscle stiffness  Pain in joint involving right pelvic region and thigh      Subjective Assessment - 12/29/14 1023    Subjective Doing better with the pubic area pain.  Has appointment for pelvic pain specialist, but not until 10/13, so might not need it by then.  Plans to try Flexitouch next week after  left side is less sore at [place] tube removal site.   Had CT scan earlier this week and it showed a couple of spots on the liver and one in the right lung (all new areas).  Will have to see if the new drug, fulvestrant, works.                                             Currently in Pain? Yes   Pain Score 5    Pain Location Axilla   Pain Orientation Right                         [facility] Adult PT Treatment/Exercise - 12/29/14 0001    Manual Therapy   Manual Lymphatic Drainage (MLD) In left sidelying, posterior interaxillary anastomosis and right axillo-inguinal anastomosis; in supine, short neck, left axilla and anterior interaxillary anastomosis, right groin and axillo-inguinal anastomosis, area between right breast scars, directing toward pathways.  In right sidelying, left periscapular area toward left groin.   Manual Therapy Other (comment)  On right side, soft tissue work right pubic/ischial tuberosi                        Long Term Clinic Goals - 12/29/14 1237    CC Long Term Goal  #1   Title Reports  left upper back and left side pain is controlled at 4/10 or less with therapy at the current frequency.   Status On-going   CC Long Term Goal  #2   Title Pt. will report swelling is adequately managed to enable ADL function at a consistent level.   Status On-going            Plan - 12/29/14 1236    Clinical Impression Statement Patient doing a little better with ischial tuberosity/pubic area pain today; she demonstrates being able to walk with less limp for a few steps without her cane.   Pt will benefit from skilled therapeutic intervention in order to improve on the following deficits Increased edema;Pain;Increased fascial restricitons   Rehab Potential Good   Clinical Impairments Affecting Rehab Potential active cancer   PT Frequency 2x / week   PT Duration 12 weeks   PT Treatment/Interventions Manual lymph drainage;Manual techniques   PT Next  Visit Plan manual lymph drainage, soft tissue work   Consulted and Agree with Plan of Care Patient        Problem List Patient Active Problem List   Diagnosis Date Noted  . Nausea with vomiting 11/18/2014  . Constipation 11/18/2014  . Left-sided thoracic back pain   . Bone metastases 11/16/2014  . Back pain 11/15/2014  . 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 Uncontrolled pain 11/14/2014  . 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 Chest wall pain 03/21/2014  . Abnormal LFTs (liver function tests) 09/12/2013  . Pleural effusion 08/18/2013  . 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

## 2015-01-01 ENCOUNTER — Ambulatory Visit: Payer: 59 | Attending: Oncology | Admitting: Physical Therapy

## 2015-01-01 DIAGNOSIS — M545 Low back pain: Secondary | ICD-10-CM | POA: Diagnosis present

## 2015-01-01 DIAGNOSIS — G729 Myopathy, unspecified: Secondary | ICD-10-CM | POA: Insufficient documentation

## 2015-01-01 DIAGNOSIS — M6289 Other specified disorders of muscle: Secondary | ICD-10-CM

## 2015-01-01 DIAGNOSIS — M549 Dorsalgia, unspecified: Secondary | ICD-10-CM | POA: Diagnosis present

## 2015-01-01 DIAGNOSIS — M25551 Pain in right hip: Secondary | ICD-10-CM

## 2015-01-01 DIAGNOSIS — I890 Lymphedema, not elsewhere classified: Secondary | ICD-10-CM | POA: Insufficient documentation

## 2015-01-01 NOTE — Therapy (Signed)
[place], [place], [address] Phone: [phone]   Fax:  [phone]  Physical Therapy Treatment  Patient Details  Name: [name] [name] MRN: [id number] Date of Birth: 09-06-1960 Referring Provider:  Chauncey Cruel, MD  Encounter Date: 01/01/2015      PT End of Session - 01/01/15 1413    Visit Number 41   Number of Visits 39   Date for PT Re-Evaluation 02/09/15   PT Start Time 1020   PT Stop Time 1100   PT Time Calculation (min) 40 min   Activity Tolerance Patient tolerated treatment well   Behavior During Therapy [name] [name] for tasks assessed/performed      Past Medical History  Diagnosis Date  . Seizures 2010    Isolated incident.  Marland Kitchen PONV (postoperative nausea and vomiting)   . Peripheral vascular disease 02/2010    blood clot related to porta cath  . Breast cancer dx'd 2005/2011  . Bone metastases dx'd 05/2014  . S/P radiation therapy 07/17/2014 through 08/02/2014     Left mediastinum, left seventh rib 3250 cGy in 13 sessions     Past Surgical History  Procedure Laterality Date  . Breast lumpectomy  2005  . Axillary lymph node dissection  Dec. 2011  . Portacath placement  12/11  . Removal portacath    . Mediastinotomy chamberlain mcneil Left 06/02/2013    Procedure: MEDIASTINOTOMY CHAMBERLAIN MCNEIL;  Surgeon: [name] Nakayama, MD;  Location: [facility];  Service: Thoracic;  Laterality: Left;  LEFT ANTERIOR MEDIASTINOTOMY     There were no vitals filed for this visit.  Visit Diagnosis:  Lymphedema  Muscle stiffness  Pain in joint involving right pelvic region and thigh      Subjective Assessment - 01/01/15 1019    Subjective Did more walking in general on Saturday and Sunday, and tried to walk in the house some without the cane.  Now has a tight muscle feeling at posterior right thigh.   Currently  in Pain? Yes   Pain Score 5    Pain Location Axilla  also left back   Pain Orientation Right                         [facility] Adult PT Treatment/Exercise - 01/01/15 0001    Manual Therapy   Manual Lymphatic Drainage (MLD) In left sidelying, posterior interaxillary anastomosis and right axillo-inguinal anastomosis; in supine, short neck, left axilla and anterior interaxillary anastomosis, right groin and axillo-inguinal anastomosis, area between right breast scars, directing toward pathways.  In right sidelying, left periscapular area toward left groin.   Manual Therapy Other (comment)  Soft tissue work right superior hamstring/ischial tuberosity                PT Education - 01/01/15 1023    Education provided Yes   Education Details Seated right hamstring stretch   Person(s) Educated Patient   Methods Explanation;Demonstration   Comprehension Verbalized understanding;Returned demonstration                [facility] Goals - 12/29/14 1237    CC Long Term Goal  #1   Title Reports left upper back and left side pain is controlled at 4/10 or less with therapy at the current frequency.   Status On-going   CC Long Term Goal  #2   Title Pt. will report swelling is adequately managed to enable ADL function at a consistent level.  Status On-going            Plan - 01/01/15 1414    Clinical Impression Statement Right thigh pain is now more along medial hamstring, superior portion.  Continues to benefit from soft tissue work there, and pt. was shown to do stretching to this area.   Pt will benefit from skilled therapeutic intervention in order to improve on the following deficits Increased edema;Pain;Increased fascial restricitons   Rehab Potential Good   Clinical Impairments Affecting Rehab Potential active cancer   PT Frequency 2x / week   PT Duration 12 weeks   PT Treatment/Interventions Manual lymph drainage;Manual techniques;Therapeutic  exercise   PT Next Visit Plan manual lymph drainage, soft tissue work   PT Home Exercise Plan try right hamstring stretches in seated position   Consulted and Agree with Plan of Care Patient        Problem List Patient Active Problem List   Diagnosis Date Noted  . Nausea with vomiting 11/18/2014  . Constipation 11/18/2014  . Left-sided thoracic back pain   . Bone metastases ([place]) 11/16/2014  . Back pain 11/15/2014  . 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 Uncontrolled pain 11/14/2014  . 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 Chest wall pain 03/21/2014  . Abnormal LFTs (liver function tests) 09/12/2013  . Pleural effusion 08/18/2013  . Breast cancer of upper-inner quadrant of right female breast ([place]) 08/18/2013  . Secondary malignant neoplasm of mediastinal lymph node ([place]) 08/18/2013    [name] [name] 01/01/2015, 2:16 PM  [place], [place], [address] Phone: [phone]   Fax:  [name] [name], PT 01/01/2015 2:17 PM

## 2015-01-02 ENCOUNTER — Other Ambulatory Visit: Payer: Self-pay

## 2015-01-02 ENCOUNTER — Ambulatory Visit: Payer: 59 | Admitting: Internal Medicine

## 2015-01-02 DIAGNOSIS — C50211 Malignant neoplasm of upper-inner quadrant of right female breast: Secondary | ICD-10-CM

## 2015-01-02 DIAGNOSIS — C7951 Secondary malignant neoplasm of bone: Secondary | ICD-10-CM

## 2015-01-02 DIAGNOSIS — C771 Secondary and unspecified malignant neoplasm of intrathoracic lymph nodes: Secondary | ICD-10-CM

## 2015-01-03 ENCOUNTER — Ambulatory Visit: Payer: 59 | Attending: Oncology | Admitting: Physical Therapy

## 2015-01-03 ENCOUNTER — Other Ambulatory Visit: Payer: Self-pay | Admitting: Oncology

## 2015-01-03 ENCOUNTER — Encounter: Payer: Self-pay | Admitting: Physical Therapy

## 2015-01-03 ENCOUNTER — Other Ambulatory Visit (HOSPITAL_BASED_OUTPATIENT_CLINIC_OR_DEPARTMENT_OTHER): Payer: 59

## 2015-01-03 ENCOUNTER — Ambulatory Visit (HOSPITAL_BASED_OUTPATIENT_CLINIC_OR_DEPARTMENT_OTHER): Payer: 59

## 2015-01-03 DIAGNOSIS — C50211 Malignant neoplasm of upper-inner quadrant of right female breast: Secondary | ICD-10-CM

## 2015-01-03 DIAGNOSIS — C771 Secondary and unspecified malignant neoplasm of intrathoracic lymph nodes: Secondary | ICD-10-CM

## 2015-01-03 DIAGNOSIS — G729 Myopathy, unspecified: Secondary | ICD-10-CM | POA: Diagnosis present

## 2015-01-03 DIAGNOSIS — Z5111 Encounter for antineoplastic chemotherapy: Secondary | ICD-10-CM | POA: Diagnosis not present

## 2015-01-03 DIAGNOSIS — M25551 Pain in right hip: Secondary | ICD-10-CM | POA: Diagnosis present

## 2015-01-03 DIAGNOSIS — R1031 Right lower quadrant pain: Secondary | ICD-10-CM | POA: Diagnosis not present

## 2015-01-03 DIAGNOSIS — C7951 Secondary malignant neoplasm of bone: Secondary | ICD-10-CM

## 2015-01-03 LAB — CBC WITH DIFFERENTIAL/PLATELET
BASO%: 0.8 % (ref 0.0–2.0)
Basophils Absolute: 0 10*3/uL (ref 0.0–0.1)
EOS ABS: 0.1 10*3/uL (ref 0.0–0.5)
EOS%: 1.6 % (ref 0.0–7.0)
HCT: 39.6 % (ref 34.8–46.6)
HGB: 13.2 g/dL (ref 11.6–15.9)
LYMPH%: 6.8 % — AB (ref 14.0–49.7)
MCH: 30.5 pg (ref 25.1–34.0)
MCHC: 33.3 g/dL (ref 31.5–36.0)
MCV: 91.6 fL (ref 79.5–101.0)
MONO#: 0.4 10*3/uL (ref 0.1–0.9)
MONO%: 7.7 % (ref 0.0–14.0)
NEUT%: 83.1 % — ABNORMAL HIGH (ref 38.4–76.8)
NEUTROS ABS: 4.3 10*3/uL (ref 1.5–6.5)
Platelets: 462 10*3/uL — ABNORMAL HIGH (ref 145–400)
RBC: 4.32 10*6/uL (ref 3.70–5.45)
RDW: 13 % (ref 11.2–14.5)
WBC: 5.2 10*3/uL (ref 3.9–10.3)
lymph#: 0.4 10*3/uL — ABNORMAL LOW (ref 0.9–3.3)

## 2015-01-03 LAB — COMPREHENSIVE METABOLIC PANEL (CC13)
ALT: 54 U/L (ref 0–55)
AST: 33 U/L (ref 5–34)
Albumin: 3.6 g/dL (ref 3.5–5.0)
Alkaline Phosphatase: 143 U/L (ref 40–150)
Anion Gap: 8 mEq/L (ref 3–11)
BILIRUBIN TOTAL: 0.33 mg/dL (ref 0.20–1.20)
BUN: 9.3 mg/dL (ref 7.0–26.0)
CO2: 27 mEq/L (ref 22–29)
Calcium: 9.8 mg/dL (ref 8.4–10.4)
Chloride: 102 mEq/L (ref 98–109)
Creatinine: 0.7 mg/dL (ref 0.6–1.1)
GLUCOSE: 91 mg/dL (ref 70–140)
Potassium: 3.6 mEq/L (ref 3.5–5.1)
SODIUM: 137 meq/L (ref 136–145)
TOTAL PROTEIN: 7.4 g/dL (ref 6.4–8.3)

## 2015-01-03 MED ORDER — FULVESTRANT 250 MG/5ML IM SOLN
500.0000 mg | Freq: Once | INTRAMUSCULAR | Status: AC
  Administered 2015-01-03: 500 mg via INTRAMUSCULAR
  Filled 2015-01-03: qty 10

## 2015-01-03 NOTE — Therapy (Signed)
[facility] [address] [place], [place], [address] Phone: [phone]   Fax:  [phone]  Physical Therapy Evaluation  Patient Details  Name: [name] [name] MRN: [id number] Date of Birth: [birth year] Referring Provider:  Chauncey Cruel, MD  Encounter Date: 01/03/2015      PT End of Session - 01/03/15 1310    Visit Number 1  groin pain   Date for PT Re-Evaluation 02/14/15   PT Start Time 1230   PT Stop Time 1310   PT Time Calculation (min) 40 min   Activity Tolerance Patient tolerated treatment well   Behavior During Therapy [facility] for tasks assessed/performed      Past Medical History  Diagnosis Date  . Seizures ([place]) 2010    Isolated incident.  Marland Kitchen PONV (postoperative nausea and vomiting)   . Peripheral vascular disease ([place]) 02/2010    blood clot related to porta cath  . Breast cancer ([place]) dx'd 2005/2011  . Bone metastases ([place]) dx'd 05/2014  . S/P radiation therapy 07/17/2014 through 08/02/2014     Left mediastinum, left seventh rib 3250 cGy in 13 sessions     Past Surgical History  Procedure Laterality Date  . Breast lumpectomy  2005  . Axillary lymph node dissection  Dec. 2011  . Portacath placement  12/11  . Removal portacath    . Mediastinotomy chamberlain mcneil Left 06/02/2013    Procedure: MEDIASTINOTOMY CHAMBERLAIN MCNEIL;  Surgeon: [name], MD;  Location: [facility];  Service: Thoracic;  Laterality: Left;  LEFT ANTERIOR MEDIASTINOTOMY     There were no vitals filed for this visit.  Visit Diagnosis:  Groin pain, right - Plan: PT plan of care cert/re-cert      Subjective Assessment - 01/03/15 1236    Subjective Last few days I am able to move better.  Patient reports the pain started on 11/03/2014.  I had issues from constipation from the medication I am taking.  When the pain came into the right  groin area and increased pain with weightbear on right.     How long can you walk comfortably? make patient limp   Patient Stated Goals walking with minimal pain   Currently in Pain? Yes   Pain Score 5    Pain Location Groin   Pain Orientation Right   Pain Descriptors / Indicators Pressure   Pain Type Acute pain   Pain Onset More than a month ago   Pain Frequency Intermittent   Aggravating Factors  walking   Pain Relieving Factors sitting   Effect of Pain on Daily Activities difficulty walking   Multiple Pain Sites No            [facility] PT Assessment - 01/03/15 0001    Assessment   Medical Diagnosis perirectal rectal/perineal pain   Onset Date/Surgical Date 11/30/14   Prior Therapy None   Precautions   Precautions None   Precaution Comments No ultrasound   Balance Screen   Has the patient fallen in the past 6 months No   Has the patient had a decrease in activity level because of a fear of falling?  No   Is the patient reluctant to leave their home because of a fear of falling?  No   Prior Function   Level of Independence Independent   Cognition   Overall Cognitive Status Within Functional Limits for tasks assessed   ROM / Strength   AROM / PROM / Strength AROM;PROM;Strength   Strength  Overall Strength Comments right hamstring 4/5 with pull in ischial tuberosity   Right Hip ABduction 3+/5   Palpation   SI assessment  right ilium is rotated anteriorly                 Pelvic Floor Special Questions - 01/03/15 0001    Pelvic Floor Internal Exam Patient approves physical therapist to perform pelvic floor muscle assessment   Exam Type Vaginal   Palpation tenderness located on righ tobturator internist and levator ani          [facility] Adult PT Treatment/Exercise - 01/03/15 0001    Manual Therapy   Manual Therapy Internal Pelvic Floor;Muscle Energy Technique;Joint mobilization   Joint Mobilization sacral mobilization to correct right rotation   Internal  Pelvic Floor right obturator  and levator ani   Muscle Energy Technique correct anteriorly rotated right ilium                  PT Short Term Goals - 01/03/15 1318    PT SHORT TERM GOAL #1   Title pain with walking decreased >/= 25%   Time 3   Period Weeks   Status New   PT SHORT TERM GOAL #2   Title pelvis stays in correct alignment for 3 weeks   Time 3   Period Weeks   Status New           PT Long Term Goals - 01/03/15 1319    PT LONG TERM GOAL #1   Title indpendent with HEP   Time 6   Period Weeks   Status New   PT LONG TERM GOAL #2   Title pain with walking decreased >/= 80%   Time 6   Period Weeks   Status New   PT LONG TERM GOAL #3   Title understand how to perfrom abdominal massage for constipation   Time 6   Period Weeks   Status New           Long Term Clinic Goals - 12/29/14 1237    CC Long Term Goal  #1   Title Reports left upper back and left side pain is controlled at 4/10 or less with therapy at the current frequency.   Status On-going   CC Long Term Goal  #2   Title Pt. will report swelling is adequately managed to enable ADL function at a consistent level.   Status On-going            Plan - 01/03/15 1311    Clinical Impression Statement Patient is a 54 year old female with diagnosis of perineal pain/rectal pain.  Patient reports sudden onset of right groin pain that makes it painful to put weight on right leg. Patient reports 5/10 pain with walking.  Right ilium was anteriorly rotated.  Sacrum rotated  right.  Palpable tenderness located in rightobturator internist and levator ani. Patient will limp on the right as she is walking. after therapy patient was able to walk without a limp. Patient  will benefit from phsycial therapy to reduce right groin pain. Patient has utililzed her visit limit.  She requested for more visits and was approved for 108.    Pt will benefit from skilled therapeutic intervention in order to improve on  the following deficits Pain;Decreased mobility;Difficulty walking;Increased muscle spasms;Decreased activity tolerance   Rehab Potential Excellent   PT Frequency 2x / week   PT Duration 6 weeks   PT Treatment/Interventions Therapeutic exercise;Therapeutic activities;Neuromuscular re-education;Patient/family education;Manual techniques;Passive  range of motion   PT Next Visit Plan soft tissue work, correct pelvis   PT Home Exercise Plan stretchs for hip rotation, hip abduction strength   Recommended Other Services None   Consulted and Agree with Plan of Care Patient         Problem List Patient Active Problem List   Diagnosis Date Noted  . Nausea with vomiting 11/18/2014  . Constipation 11/18/2014  . Left-sided thoracic back pain   . Bone metastases ([place]) 11/16/2014  . Back pain 11/15/2014  . 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 Uncontrolled pain 11/14/2014  . 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 Chest wall pain 03/21/2014  . Abnormal LFTs (liver function tests) 09/12/2013  . Pleural effusion 08/18/2013  . Breast cancer of upper-inner quadrant of right female breast ([place]) 08/18/2013  . Secondary malignant neoplasm of mediastinal lymph node ([place]) 08/18/2013    GRAY,CHERYL,PT 01/03/2015, 1:21 PM  [facility] [address], [place], [place], [address] Phone: [phone]   Fax:  [phone]

## 2015-01-05 ENCOUNTER — Ambulatory Visit: Payer: 59 | Admitting: Physical Therapy

## 2015-01-05 DIAGNOSIS — I890 Lymphedema, not elsewhere classified: Secondary | ICD-10-CM | POA: Diagnosis not present

## 2015-01-05 DIAGNOSIS — M25551 Pain in right hip: Secondary | ICD-10-CM

## 2015-01-05 NOTE — Therapy (Signed)
[place], [place], [address] Phone: [phone]   Fax:  [phone]  Physical Therapy Treatment  Patient Details  Name: [name] [name] MRN: [id number] Date of Birth: [birth year] Referring Provider:  Chauncey Cruel, MD  Encounter Date: 01/05/2015      PT End of Session - 01/05/15 1249    Visit Number 63  Cancer Rehab only; not including pelvic floor   Number of Visits 88   Date for PT Re-Evaluation 02/14/15   PT Start Time 1022   PT Stop Time 1105   PT Time Calculation (min) 43 min   Activity Tolerance Patient tolerated treatment well   Behavior During Therapy [name] [name] for tasks assessed/performed      Past Medical History  Diagnosis Date  . Seizures ([name] [name]) 2010    Isolated incident.  Marland Kitchen PONV (postoperative nausea and vomiting)   . Peripheral vascular disease ([name] [name]) 02/2010    blood clot related to porta cath  . Breast cancer ([name] [name]) dx'd 2005/2011  . Bone metastases ([name] [name]) dx'd 05/2014  . S/P radiation therapy 07/17/2014 through 08/02/2014     Left mediastinum, left seventh rib 3250 cGy in 13 sessions     Past Surgical History  Procedure Laterality Date  . Breast lumpectomy  2005  . Axillary lymph node dissection  Dec. 2011  . Portacath placement  12/11  . Removal portacath    . Mediastinotomy chamberlain mcneil Left 06/02/2013    Procedure: MEDIASTINOTOMY CHAMBERLAIN MCNEIL;  Surgeon: [name] Nakayama, MD;  Location: [name];  Service: Thoracic;  Laterality: Left;  LEFT ANTERIOR MEDIASTINOTOMY     There were no vitals filed for this visit.  Visit Diagnosis:  Lymphedema  Pain in joint involving right pelvic region and thigh      Subjective Assessment - 01/05/15 1025    Subjective Not using a cane now; saw [name] [name] this week for pelvic therapy and was able to walk normally after that.  Still having some  trouble.  Tried Flexitouch but didn't tolerate it well yet.   Currently in Pain? Yes   Pain Score 5   4 for right pubic area   Pain Location Axilla   Pain Orientation Right  and left upper back                         [facility] Adult PT Treatment/Exercise - 01/05/15 0001    Manual Therapy   Manual Lymphatic Drainage (MLD) In left sidelying, posterior interaxillary anastomosis and right axillo-inguinal anastomosis; in supine, short neck, left axilla and anterior interaxillary anastomosis, right groin and axillo-inguinal anastomosis, area between right breast scars, directing toward pathways.  In right sidelying, left periscapular area toward left groin.   Manual Therapy Other (comment)  Soft tissue work right superior hamstring/ischial tuberosity                  PT Short Term Goals - 01/03/15 1318    PT SHORT TERM GOAL #1   Title pain with walking decreased >/= 25%   Time 3   Period Weeks   Status New   PT SHORT TERM GOAL #2   Title pelvis stays in correct alignment for 3 weeks   Time 3   Period Weeks   Status New           PT Long Term Goals - 01/03/15 1319    PT LONG TERM GOAL #1   Title indpendent with HEP  Time 6   Period Weeks   Status New   PT LONG TERM GOAL #2   Title pain with walking decreased >/= 80%   Time 6   Period Weeks   Status New   PT LONG TERM GOAL #3   Title understand how to perfrom abdominal massage for constipation   Time 6   Period Weeks   Status New           Long Term Clinic Goals - 01/05/15 1254    CC Long Term Goal  #1   Title Reports left upper back and left side pain is controlled at 4/10 or less with therapy at the current frequency.   Baseline Goal is currently being met and is ongoing.   Status On-going   CC Long Term Goal  #2   Title Pt. will report swelling is adequately managed to enable ADL function at a consistent level.   Baseline Swelling is being managed; patient hopes to be able to use her  pump soon.  Goal is ongoing.   Status On-going            Plan - 01/05/15 1251    Clinical Impression Statement Significant induration and fullness noted at right lateral breast area between incision scars today, more than it has been recently.  Right hamstring tightness less than at last visit with Cancer Rehab, and patient came in without her cane today.   Pt will benefit from skilled therapeutic intervention in order to improve on the following deficits Increased edema;Pain   Rehab Potential Good   Clinical Impairments Affecting Rehab Potential active cancer   PT Frequency 2x / week   PT Duration 12 weeks   PT Treatment/Interventions Manual lymph drainage;Manual techniques   PT Next Visit Plan for Cancer Rehab, continue manual lymph drainage and soft tissue work   Consulted and Agree with Plan of Care Patient        Problem List Patient Active Problem List   Diagnosis Date Noted  . Nausea with vomiting 11/18/2014  . Constipation 11/18/2014  . Left-sided thoracic back pain   . Bone metastases ([place]) 11/16/2014  . Back pain 11/15/2014  . 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 Uncontrolled pain 11/14/2014  . 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 Chest wall pain 03/21/2014  . Abnormal LFTs (liver function tests) 09/12/2013  . Pleural effusion 08/18/2013  . Breast cancer of upper-inner quadrant of right female breast ([place]) 08/18/2013  . Secondary malignant neoplasm of mediastinal lymph node ([place]) 08/18/2013    [name] [name],[name] [name] 01/05/2015, 12:56 PM  [name], [place], [address] Phone: [phone]   Fax:  [name] [name], PT 01/05/2015 12:56 PM

## 2015-01-08 ENCOUNTER — Other Ambulatory Visit: Payer: Self-pay

## 2015-01-08 ENCOUNTER — Ambulatory Visit: Payer: 59 | Admitting: Physical Therapy

## 2015-01-08 ENCOUNTER — Other Ambulatory Visit: Payer: Self-pay | Admitting: Oncology

## 2015-01-08 ENCOUNTER — Telehealth: Payer: Self-pay

## 2015-01-08 ENCOUNTER — Ambulatory Visit (HOSPITAL_COMMUNITY)
Admission: RE | Admit: 2015-01-08 | Discharge: 2015-01-08 | Disposition: A | Discharge status: Home / Self Care | Payer: 59 | Source: Ambulatory Visit | Attending: Oncology | Admitting: Oncology

## 2015-01-08 DIAGNOSIS — R0789 Other chest pain: Secondary | ICD-10-CM | POA: Insufficient documentation

## 2015-01-08 DIAGNOSIS — J90 Pleural effusion, not elsewhere classified: Secondary | ICD-10-CM

## 2015-01-08 DIAGNOSIS — C50211 Malignant neoplasm of upper-inner quadrant of right female breast: Secondary | ICD-10-CM

## 2015-01-08 DIAGNOSIS — I890 Lymphedema, not elsewhere classified: Secondary | ICD-10-CM

## 2015-01-08 DIAGNOSIS — C771 Secondary and unspecified malignant neoplasm of intrathoracic lymph nodes: Secondary | ICD-10-CM | POA: Diagnosis not present

## 2015-01-08 DIAGNOSIS — M25551 Pain in right hip: Secondary | ICD-10-CM

## 2015-01-08 DIAGNOSIS — R05 Cough: Secondary | ICD-10-CM | POA: Diagnosis not present

## 2015-01-08 NOTE — Progress Notes (Unsigned)
[name] [name] came today because she was having cough and she wanted to have a chest x-ray. It doesn't show any change. I gave her a copy of the report. She I think may be having a little bit of extra problems because of the change in weather but it could indeed be scar tissue in the left lung.  She is considering visiting a niece in [place] for a shower. I really am not entirely comfortable with her flying. She could drive, without would be a strain on the family. Her sister does have private plane and those fly lower, I think that might work for her so she will consider that.  She sees me next week for routine follow-up.

## 2015-01-08 NOTE — Telephone Encounter (Signed)
Patient called today requesting an order be placed for a chest xray today.  Ok'd by Dr. Jana Hakim.  Patient will wait in the breast center for results.  She also wanted to know if it was ok to use rogaine on her bald spots and if she could travel.

## 2015-01-08 NOTE — Therapy (Signed)
[place], [place], [address] Phone: [phone]   Fax:  [phone]  Physical Therapy Treatment  Patient Details  Name: [name] [name] MRN: [id number] Date of Birth: February 19, 1961 Referring Provider:  Chauncey Cruel, MD  Encounter Date: 01/08/2015      [name] [name] End of Session - 01/08/15 1237    Visit Number 64  Cancer Rehab only; not including pelvic floor   Number of Visits 88   Date for [name] [name] Re-Evaluation 02/14/15   [name] [name] Start Time 1025   [name] [name] Stop Time 1104   [name] [name] Time Calculation (min) 39 min   Activity Tolerance Patient tolerated treatment well   Behavior During Therapy [facility] for tasks assessed/performed      Past Medical History  Diagnosis Date  . Seizures ([place]) 2010    Isolated incident.  Marland Kitchen PONV (postoperative nausea and vomiting)   . Peripheral vascular disease ([place]) 02/2010    blood clot related to porta cath  . Breast cancer ([place]) dx'd 2005/2011  . Bone metastases ([place]) dx'd 05/2014  . S/P radiation therapy 07/17/2014 through 08/02/2014     Left mediastinum, left seventh rib 3250 cGy in 13 sessions     Past Surgical History  Procedure Laterality Date  . Breast lumpectomy  2005  . Axillary lymph node dissection  Dec. 2011  . Portacath placement  12/11  . Removal portacath    . Mediastinotomy chamberlain mcneil Left 06/02/2013    Procedure: MEDIASTINOTOMY CHAMBERLAIN MCNEIL;  Surgeon: [name], MD;  Location: [place];  Service: Thoracic;  Laterality: Left;  LEFT ANTERIOR MEDIASTINOTOMY     There were no vitals filed for this visit.  Visit Diagnosis:  Lymphedema  Pain in joint involving right pelvic region and thigh      Subjective Assessment - 01/08/15 1026    Subjective I still have this cough.   Currently in Pain? Yes   Pain Score 5    Pain Location Axilla   Pain Orientation Right  and  left side 3/10 and "heavy"; leg is 3-4/10   Aggravating Factors  more swelling at left lateral breast area   Pain Relieving Factors therapy                         [facility] Adult [name] [name] Treatment/Exercise - 01/08/15 0001    Manual Therapy   Manual Lymphatic Drainage (MLD) In left sidelying, posterior interaxillary anastomosis and right axillo-inguinal anastomosis; in supine, short neck, left axilla and anterior interaxillary anastomosis, right groin and axillo-inguinal anastomosis, area between right breast scars, directing toward pathways.  In right sidelying, left periscapular area toward left groin.   Manual Therapy Other (comment)  Soft tissue work right superior hamstring/ischial tuberosity                  [name] [name] Short Term Goals - 01/03/15 1318    [name] [name] SHORT TERM GOAL #1   Title pain with walking decreased >/= 25%   Time 3   Period Weeks   Status New   [name] [name] SHORT TERM GOAL #2   Title pelvis stays in correct alignment for 3 weeks   Time 3   Period Weeks   Status New           [name] [name] Long Term Goals - 01/03/15 1319    [name] [name] LONG TERM GOAL #1   Title indpendent with HEP   Time 6   Period Weeks   Status New  [name] [name] LONG TERM GOAL #2   Title pain with walking decreased >/= 80%   Time 6   Period Weeks   Status New   [name] [name] LONG TERM GOAL #3   Title understand how to perfrom abdominal massage for constipation   Time 6   Period Weeks   Status New           Long Term Clinic Goals - 01/05/15 1254    CC Long Term Goal  #1   Title Reports left upper back and left side pain is controlled at 4/10 or less with therapy at the current frequency.   Baseline Goal is currently being met and is ongoing.   Status On-going   CC Long Term Goal  #2   Title [name] [name]. will report swelling is adequately managed to enable ADL function at a consistent level.   Baseline Swelling is being managed; patient hopes to be able to use her pump soon.  Goal is ongoing.   Status On-going             Plan - 01/08/15 1238    Clinical Impression Statement Again with significan induration and fullness at right lateral breast area between incisions today.   [name] [name] will benefit from skilled therapeutic intervention in order to improve on the following deficits Increased edema;Pain   Rehab Potential Good   Clinical Impairments Affecting Rehab Potential active cancer   [name] [name] Frequency 2x / week   [name] [name] Duration 12 weeks   [name] [name] Treatment/Interventions Manual lymph drainage;Manual techniques   [name] [name] Next Visit Plan for Cancer Rehab, continue manual lymph drainage and soft tissue work   Consulted and Agree with Plan of Care Patient        Problem List Patient Active Problem List   Diagnosis Date Noted  . Nausea with vomiting 11/18/2014  . Constipation 11/18/2014  . Left-sided thoracic back pain   . Bone metastases ([place]) 11/16/2014  . Back pain 11/15/2014  . 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 Uncontrolled pain 11/14/2014  . 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 Chest wall pain 03/21/2014  . Abnormal LFTs (liver function tests) 09/12/2013  . Pleural effusion 08/18/2013  . Breast cancer of upper-inner quadrant of right female breast ([place]) 08/18/2013  . 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

## 2015-01-10 ENCOUNTER — Encounter: Payer: Self-pay | Admitting: Physical Therapy

## 2015-01-10 ENCOUNTER — Ambulatory Visit: Payer: 59 | Admitting: Physical Therapy

## 2015-01-10 DIAGNOSIS — M6289 Other specified disorders of muscle: Secondary | ICD-10-CM

## 2015-01-10 DIAGNOSIS — R1031 Right lower quadrant pain: Secondary | ICD-10-CM | POA: Diagnosis not present

## 2015-01-10 DIAGNOSIS — M25551 Pain in right hip: Secondary | ICD-10-CM

## 2015-01-10 NOTE — Patient Instructions (Signed)
About Abdominal Massage  Abdominal massage, also called external colon massage, is a self-treatment circular massage technique that can reduce and eliminate gas and ease constipation. The colon naturally contracts in waves in a clockwise direction starting from inside the right hip, moving up toward the ribs, across the belly, and down inside the left hip.  When you perform circular abdominal massage, you help stimulate your colon's normal wave pattern of movement called peristalsis.  It is most beneficial when done after eating.  Positioning You can practice abdominal massage with oil while lying down, or in the shower with soap.  Some people find that it is just as effective to do the massage through clothing while sitting or standing.  How to Massage Start by placing your finger tips or knuckles on your right side, just inside your hip bone.  . Make small circular movements while you move upward toward your rib cage.   . Once you reach the bottom right side of your rib cage, take your circular movements across to the left side of the bottom of your rib cage.  . Next, move downward until you reach the inside of your left hip bone.  This is the path your feces travel in your colon. . Continue to perform your abdominal massage in this pattern for 10 minutes each day.     You can apply as much pressure as is comfortable in your massage.  Start gently and build pressure as you continue to practice.  Notice any areas of pain as you massage; areas of slight pain may be relieved as you massage, but if you have areas of significant or intense pain, consult with your healthcare provider.  Other Considerations . General physical activity including bending and stretching can have a beneficial massage-like effect on the colon.  Deep breathing can also stimulate the colon because breathing deeply activates the same nervous system that supplies the colon.   Abdominal massage should always be used in  combination with a bowel-conscious diet that is high in the proper type of fiber for you, fluids (primarily water), and a regular exercise program.Hook-Lying    Lie with hips and knees bent. Allow body's muscles to relax. Place hands on belly. Inhale slowly and deeply for _3__ seconds, so hands move up. Then take _3__ seconds to exhale. Repeat 5___ times. Do __2_ times a day.   Copyright  VHI. All rights reserved.  Sitting    Sit comfortably. Allow body's muscles to relax. Place hands on belly. Inhale slowly and deeply for ___ seconds, so hands move out. Then take _3__ seconds to exhale. Repeat _5__ times. Do _2__ times a day.  Copyright  VHI. All rights reserved.  [facility] [address], [place], [place] [address] Phone # [phone] Fax [phone]

## 2015-01-10 NOTE — Therapy (Signed)
[facility] [address], [place], [place], [address] Phone: [phone]   Fax:  [phone]  Physical Therapy Treatment  Patient Details  Name: [name] [name] MRN: [id number] Date of Birth: 1960/09/24 Referring Provider:  Chauncey Cruel, MD  Encounter Date: 01/10/2015      PT End of Session - 01/10/15 0804    Visit Number 2  Pelvic   Number of Visits 88   Date for PT Re-Evaluation 02/14/15   PT Start Time 0800   PT Stop Time 0840   PT Time Calculation (min) 40 min   Activity Tolerance Patient tolerated treatment well   Behavior During Therapy [facility] for tasks assessed/performed      Past Medical History  Diagnosis Date  . Seizures ([place]) 2010    Isolated incident.  Marland Kitchen PONV (postoperative nausea and vomiting)   . Peripheral vascular disease ([place]) 02/2010    blood clot related to porta cath  . Breast cancer ([place]) dx'd 2005/2011  . Bone metastases ([place]) dx'd 05/2014  . S/P radiation therapy 07/17/2014 through 08/02/2014     Left mediastinum, left seventh rib 3250 cGy in 13 sessions     Past Surgical History  Procedure Laterality Date  . Breast lumpectomy  2005  . Axillary lymph node dissection  Dec. 2011  . Portacath placement  12/11  . Removal portacath    . Mediastinotomy chamberlain mcneil Left 06/02/2013    Procedure: MEDIASTINOTOMY CHAMBERLAIN MCNEIL;  Surgeon: [name], MD;  Location: [place];  Service: Thoracic;  Laterality: Left;  LEFT ANTERIOR MEDIASTINOTOMY     There were no vitals filed for this visit.  Visit Diagnosis:  Pain in joint involving right pelvic region and thigh  Groin pain, right  Muscle stiffness      Subjective Assessment - 01/10/15 0805    Subjective I feel better.  As I walk around it increases pain.  The afternoon I will limp more.    Pertinent History patient vomited just  prior to treatment today   How long can you walk comfortably? make patient limp   Patient Stated Goals walking with minimal pain   Currently in Pain? Yes   Pain Score 4    Pain Location Groin   Pain Orientation Right   Pain Descriptors / Indicators Tightness   Pain Type Acute pain   Pain Onset More than a month ago   Pain Frequency Intermittent   Aggravating Factors  walking   Pain Relieving Factors morning   Multiple Pain Sites No            [facility] PT Assessment - 01/10/15 0001    Palpation   SI assessment  pelvis in correct alignment                  Pelvic Floor Special Questions - 01/10/15 0001    Pelvic Floor Internal Exam Patient approves physical therapist to perform pelvic floor muscle assessment   Exam Type Vaginal   Palpation tenderness located on righ tobturator internist and levator ani           [facility] Adult PT Treatment/Exercise - 01/10/15 0001    Manual Therapy   Manual Therapy Soft tissue mobilization;Myofascial release;Internal Pelvic Floor   Soft tissue mobilization abdominal massage, diaphragm, righ thip adductor   Internal Pelvic Floor right obturator  and levator ani                PT Education - 01/10/15 6160  Education provided Yes   Education Details abdominal massage, diaphragmatic breathing   Person(s) Educated Patient   Methods Explanation;Demonstration;Verbal cues;Handout   Comprehension Returned demonstration;Verbalized understanding          PT Short Term Goals - 01/10/15 0842    PT SHORT TERM GOAL #1   Title pain with walking decreased >/= 25%   Time 3   Period Weeks   Status On-going   PT SHORT TERM GOAL #2   Title pelvis stays in correct alignment for 3 weeks   Time 3   Period Weeks   Status On-going           PT Long Term Goals - 01/03/15 1319    PT LONG TERM GOAL #1   Title indpendent with HEP   Time 6   Period Weeks   Status New   PT LONG TERM GOAL #2   Title pain with walking decreased  >/= 80%   Time 6   Period Weeks   Status New   PT LONG TERM GOAL #3   Title understand how to perfrom abdominal massage for constipation   Time 6   Period Weeks   Status New           Long Term Clinic Goals - 01/05/15 1254    CC Long Term Goal  #1   Title Reports left upper back and left side pain is controlled at 4/10 or less with therapy at the current frequency.   Baseline Goal is currently being met and is ongoing.   Status On-going   CC Long Term Goal  #2   Title Pt. will report swelling is adequately managed to enable ADL function at a consistent level.   Baseline Swelling is being managed; patient hopes to be able to use her pump soon.  Goal is ongoing.   Status On-going            Plan - 01/10/15 0842    Clinical Impression Statement Patient is a 54 year old female with right groin pain.  Patient pelvis is staying in correct alignment.  Palpable tenderness located in diaphragm, right hip adductors and right obturator internist.  Patient is able to ambulate with less of a limp.  Patient will benefit from physical hterapy to reduce pain.    Pt will benefit from skilled therapeutic intervention in order to improve on the following deficits Difficulty walking;Increased muscle spasms;Decreased knowledge of use of DME   Rehab Potential Excellent   PT Frequency 2x / week   PT Duration 6 weeks   PT Treatment/Interventions Therapeutic exercise;Therapeutic activities;Neuromuscular re-education;Patient/family education;Manual techniques;Passive range of motion   PT Next Visit Plan for pelvic rehab, check pelvic alignment, soft tissue work   PT Home Exercise Plan hip adductor stretches   Consulted and Agree with Plan of Care Patient   PT Plan Continue manual lymph drainage, soft tissue work.        Problem List Patient Active Problem List   Diagnosis Date Noted  . Nausea with vomiting 11/18/2014  . Constipation 11/18/2014  . Left-sided thoracic back pain   . Bone  metastases ([place]) 11/16/2014  . Back pain 11/15/2014  . 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 Uncontrolled pain 11/14/2014  . 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 Chest wall pain 03/21/2014  . Abnormal LFTs (liver function tests) 09/12/2013  . Pleural effusion 08/18/2013  . Breast cancer of upper-inner quadrant of right female breast ([place]) 08/18/2013  .  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, [place], [place], [address] Phone: [phone]   Fax:  [phone]

## 2015-01-11 ENCOUNTER — Encounter: Payer: Self-pay | Admitting: Physical Therapy

## 2015-01-11 ENCOUNTER — Ambulatory Visit: Payer: 59 | Admitting: Physical Therapy

## 2015-01-11 DIAGNOSIS — M25551 Pain in right hip: Secondary | ICD-10-CM

## 2015-01-11 DIAGNOSIS — R1031 Right lower quadrant pain: Secondary | ICD-10-CM

## 2015-01-11 DIAGNOSIS — M6289 Other specified disorders of muscle: Secondary | ICD-10-CM

## 2015-01-11 NOTE — Patient Instructions (Signed)
Piriformis Stretch, Supine    Lie supine, legs bent, feet flat. Raise one bent leg and, grasping ankle with both hands, pull leg toward opposite shoulder. Hold _30__ seconds.  Repeat _2__ times per session. Do __1_ sessions per day. Perform with other leg straight.  Copyright  VHI. All rights reserved.  Piriformis Stretch, Sitting    Sit, one ankle on opposite knee, same-side hand on crossed knee. Push down on knee, keeping spine straight. Lean torso forward, with flat back, until tension is felt in hamstrings and gluteals of crossed-leg side. Hold _30__ seconds.  Repeat _2__ times per session. Do _1__ sessions per day.  Copyright  VHI. All rights reserved.  Adductors, Sitting With Hip Flexion    Sit with legs open in a wide V, toes pointing up, hands on knees. Keep spine straight supporting trunk with arms. Slide arms down leg as trunk tips forward. Press knees apart. Hold _30__ seconds. Repeat 2___ times per session. Do _1__ sessions per day.  Copyright  VHI. All rights reserved.  [facility] [address], [place], [place] [address] Phone # [phone] Fax [phone]

## 2015-01-11 NOTE — Therapy (Signed)
[facility] [address], [place], [place], [address] Phone: [phone]   Fax:  [phone]  Physical Therapy Treatment  Patient Details  Name: [name] [name] MRN: [id number] Date of Birth: [birth year] Referring Provider:  Chauncey Cruel, MD  Encounter Date: 01/11/2015      PT End of Session - 01/11/15 1020    Visit Number 3  pelvic   Date for PT Re-Evaluation 02/14/15   PT Start Time 1018   PT Stop Time 1058   PT Time Calculation (min) 40 min   Activity Tolerance Patient tolerated treatment well   Behavior During Therapy [facility] for tasks assessed/performed      Past Medical History  Diagnosis Date  . Seizures ([place]) 2010    Isolated incident.  Marland Kitchen PONV (postoperative nausea and vomiting)   . Peripheral vascular disease ([place]) 02/2010    blood clot related to porta cath  . Breast cancer ([place]) dx'd 2005/2011  . Bone metastases ([place]) dx'd 05/2014  . S/P radiation therapy 07/17/2014 through 08/02/2014     Left mediastinum, left seventh rib 3250 cGy in 13 sessions     Past Surgical History  Procedure Laterality Date  . Breast lumpectomy  2005  . Axillary lymph node dissection  Dec. 2011  . Portacath placement  12/11  . Removal portacath    . Mediastinotomy chamberlain mcneil Left 06/02/2013    Procedure: MEDIASTINOTOMY CHAMBERLAIN MCNEIL;  Surgeon: [name], MD;  Location: [place];  Service: Thoracic;  Laterality: Left;  LEFT ANTERIOR MEDIASTINOTOMY     There were no vitals filed for this visit.  Visit Diagnosis:  Pain in joint involving right pelvic region and thigh  Groin pain, right  Muscle stiffness      Subjective Assessment - 01/11/15 1020    Subjective I had increased pain after treatment but now feel better. Patient reports pain is 50% better.    How long can you walk comfortably? make patient limp    Patient Stated Goals walking with minimal pain   Currently in Pain? Yes   Pain Score 3    Pain Location Groin   Pain Orientation Right   Pain Descriptors / Indicators Tightness   Pain Type Acute pain   Pain Onset More than a month ago   Pain Frequency Intermittent   Aggravating Factors  walking and end of day   Pain Relieving Factors Morning   Effect of Pain on Daily Activities difficulty walking   Multiple Pain Sites No   Multiple Pain Sites No            [facility] PT Assessment - 01/11/15 0001    Palpation   SI assessment  pelvis in correct alignment                             PT Education - 01/11/15 1048    Education provided Yes   Education Details hip adductor and piriformis stretch   Person(s) Educated Patient   Methods Explanation;Demonstration;Verbal cues;Handout   Comprehension Returned demonstration;Verbalized understanding          PT Short Term Goals - 01/10/15 0842    PT SHORT TERM GOAL #1   Title pain with walking decreased >/= 25%   Time 3   Period Weeks   Status On-going   PT SHORT TERM GOAL #2   Title pelvis stays in correct alignment for 3 weeks   Time 3  Period Weeks   Status On-going           PT Long Term Goals - 01/03/15 1319    PT LONG TERM GOAL #1   Title indpendent with HEP   Time 6   Period Weeks   Status New   PT LONG TERM GOAL #2   Title pain with walking decreased >/= 80%   Time 6   Period Weeks   Status New   PT LONG TERM GOAL #3   Title understand how to perfrom abdominal massage for constipation   Time 6   Period Weeks   Status New           Long Term Clinic Goals - 01/05/15 1254    CC Long Term Goal  #1   Title Reports left upper back and left side pain is controlled at 4/10 or less with therapy at the current frequency.   Baseline Goal is currently being met and is ongoing.   Status On-going   CC Long Term Goal  #2   Title Pt. will report swelling is adequately managed to enable ADL  function at a consistent level.   Baseline Swelling is being managed; patient hopes to be able to use her pump soon.  Goal is ongoing.   Status On-going            Plan - 01/11/15 1050    Clinical Impression Statement Patient is a 54 year old female with right groin pain.  Patient pelvis in correct alignment.  Patient continues to have tighness located in right hip adductors with trigger points. Patient is 50% better.  Patient will benefit from physical therapy to reduce right groin pain.    Pt will benefit from skilled therapeutic intervention in order to improve on the following deficits Difficulty walking;Increased muscle spasms;Decreased knowledge of use of DME   Rehab Potential Excellent   Clinical Impairments Affecting Rehab Potential active cancer   PT Frequency 2x / week   PT Duration 6 weeks   PT Treatment/Interventions Therapeutic exercise;Therapeutic activities;Neuromuscular re-education;Patient/family education;Manual techniques;Passive range of motion   PT Next Visit Plan for pelvic rehab, check pelvic alignment, soft tissue work   PT Home Exercise Plan hip abductor strength   Consulted and Agree with Plan of Care Patient   PT Plan Continue manual lymph drainage, soft tissue work.        Problem List Patient Active Problem List   Diagnosis Date Noted  . Nausea with vomiting 11/18/2014  . Constipation 11/18/2014  . Left-sided thoracic back pain   . Bone metastases ([place]) 11/16/2014  . Back pain 11/15/2014  . 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 Uncontrolled pain 11/14/2014  . 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 Chest wall pain 03/21/2014  . Abnormal LFTs (liver function tests) 09/12/2013  . Pleural effusion 08/18/2013  . Breast cancer of upper-inner quadrant of right female breast ([place]) 08/18/2013  . 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 [place], [place], [address] Phone: [phone]   Fax:  [phone]

## 2015-01-12 ENCOUNTER — Ambulatory Visit: Payer: 59 | Admitting: Physical Therapy

## 2015-01-12 DIAGNOSIS — I890 Lymphedema, not elsewhere classified: Secondary | ICD-10-CM | POA: Diagnosis not present

## 2015-01-12 DIAGNOSIS — M6289 Other specified disorders of muscle: Secondary | ICD-10-CM

## 2015-01-12 NOTE — Therapy (Signed)
[place], [place], [address] Phone: [phone]   Fax:  [phone]  Physical Therapy Treatment  Patient Details  Name: [name] [name] MRN: [id number] Date of Birth: 07-11-1960 No Data Recorded  Encounter Date: 01/12/2015      PT End of Session - 01/12/15 1230    Visit Number 44  Cancer Rehab only; not pelvic floor   Number of Visits 88   Date for PT Re-Evaluation 02/14/15   PT Start Time 1022   PT Stop Time 1102   PT Time Calculation (min) 40 min   Activity Tolerance Patient tolerated treatment well   Behavior During Therapy [facility] for tasks assessed/performed      Past Medical History  Diagnosis Date  . Seizures ([place]) 2010    Isolated incident.  Marland Kitchen PONV (postoperative nausea and vomiting)   . Peripheral vascular disease ([place]) 02/2010    blood clot related to porta cath  . Breast cancer ([place]) dx'd 2005/2011  . Bone metastases ([place]) dx'd 05/2014  . S/P radiation therapy 07/17/2014 through 08/02/2014     Left mediastinum, left seventh rib 3250 cGy in 13 sessions     Past Surgical History  Procedure Laterality Date  . Breast lumpectomy  2005  . Axillary lymph node dissection  Dec. 2011  . Portacath placement  12/11  . Removal portacath    . Mediastinotomy chamberlain mcneil Left 06/02/2013    Procedure: MEDIASTINOTOMY CHAMBERLAIN MCNEIL;  Surgeon: [name], MD;  Location: [place];  Service: Thoracic;  Laterality: Left;  LEFT ANTERIOR MEDIASTINOTOMY     There were no vitals filed for this visit.  Visit Diagnosis:  Lymphedema  Muscle stiffness      Subjective Assessment - 01/12/15 1022    Subjective "[name] is helping" (with internal and external massage,and she taught me more stretches to do).  Has tried the Flexitouch again and tolerated it better, but it doesn't get at that right lateral  chest.                                                                 Currently in Pain? Yes   Pain Score 5   3 at groin area and left back   Pain Location Axilla   Pain Orientation Right                         [facility] Adult PT Treatment/Exercise - 01/12/15 0001    Manual Therapy   Manual Lymphatic Drainage (MLD) In left sidelying, posterior interaxillary anastomosis and right axillo-inguinal anastomosis; in supine, short neck, left axilla and anterior interaxillary anastomosis, right groin and axillo-inguinal anastomosis, area between right breast scars, directing toward pathways.  In right sidelying, left periscapular area toward left groin.   Manual Therapy Other (comment)  Soft tissue work right superior hamstring/hip adductors                PT Education - 01/11/15 1048    Education provided Yes   Education Details hip adductor and piriformis stretch   Person(s) Educated Patient   Methods Explanation;Demonstration;Verbal cues;Handout   Comprehension Returned demonstration;Verbalized understanding          PT Short Term Goals - 01/10/15 5102  PT SHORT TERM GOAL #1   Title pain with walking decreased >/= 25%   Time 3   Period Weeks   Status On-going   PT SHORT TERM GOAL #2   Title pelvis stays in correct alignment for 3 weeks   Time 3   Period Weeks   Status On-going           PT Long Term Goals - 01/03/15 1319    PT LONG TERM GOAL #1   Title indpendent with HEP   Time 6   Period Weeks   Status New   PT LONG TERM GOAL #2   Title pain with walking decreased >/= 80%   Time 6   Period Weeks   Status New   PT LONG TERM GOAL #3   Title understand how to perfrom abdominal massage for constipation   Time 6   Period Weeks   Status New           Long Term Clinic Goals - 01/12/15 1233    CC Long Term Goal  #1   Title Reports left upper back and left side pain is controlled at 4/10 or less with therapy at the current frequency.    Status On-going   CC Long Term Goal  #2   Title Pt. will report swelling is adequately managed to enable ADL function at a consistent level.   Status On-going            Plan - 01/12/15 1231    Clinical Impression Statement Continuing to have increased swelling and induration at right lateral breast, where she reports her Flexitouch pump doesn't reach to help move fluid away.   Pt will benefit from skilled therapeutic intervention in order to improve on the following deficits Increased edema;Pain;Increased fascial restricitons   Rehab Potential Good   Clinical Impairments Affecting Rehab Potential active cancer   PT Frequency 2x / week   PT Duration 12 weeks   PT Treatment/Interventions Manual techniques;Manual lymph drainage   PT Next Visit Plan for cancer rehab, continue manual lymph drainage and soft tissue work   Consulted and Agree with Plan of Care Patient        Problem List Patient Active Problem List   Diagnosis Date Noted  . Nausea with vomiting 11/18/2014  . Constipation 11/18/2014  . Left-sided thoracic back pain   . Bone metastases ([place]) 11/16/2014  . Back pain 11/15/2014  . 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 Uncontrolled pain 11/14/2014  . 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 Chest wall pain 03/21/2014  . Abnormal LFTs (liver function tests) 09/12/2013  . Pleural effusion 08/18/2013  . Breast cancer of upper-inner quadrant of right female breast ([place]) 08/18/2013  . 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

## 2015-01-15 ENCOUNTER — Ambulatory Visit: Payer: 59 | Admitting: Physical Therapy

## 2015-01-15 ENCOUNTER — Other Ambulatory Visit: Payer: Self-pay | Admitting: *Deleted

## 2015-01-15 ENCOUNTER — Encounter: Payer: Self-pay | Admitting: Physical Therapy

## 2015-01-15 DIAGNOSIS — I890 Lymphedema, not elsewhere classified: Secondary | ICD-10-CM

## 2015-01-15 DIAGNOSIS — M6289 Other specified disorders of muscle: Secondary | ICD-10-CM

## 2015-01-15 DIAGNOSIS — R1031 Right lower quadrant pain: Secondary | ICD-10-CM

## 2015-01-15 DIAGNOSIS — M25551 Pain in right hip: Secondary | ICD-10-CM

## 2015-01-15 DIAGNOSIS — M545 Low back pain, unspecified: Secondary | ICD-10-CM

## 2015-01-15 DIAGNOSIS — C50211 Malignant neoplasm of upper-inner quadrant of right female breast: Secondary | ICD-10-CM

## 2015-01-15 NOTE — Therapy (Signed)
[facility] [address], [place], [place], [address] Phone: [phone]   Fax:  [phone]  Physical Therapy Treatment  Patient Details  Name: [name] [name] MRN: [id number] Date of Birth: Oct 31, 1960 No Data Recorded  Encounter Date: 01/15/2015      PT End of Session - 01/15/15 1535    Visit Number 4  pelvic   Date for PT Re-Evaluation 02/14/15   PT Start Time 1530   PT Stop Time 1610   PT Time Calculation (min) 40 min   Activity Tolerance Patient tolerated treatment well   Behavior During Therapy [facility] for tasks assessed/performed      Past Medical History  Diagnosis Date  . Seizures ([place]) 2010    Isolated incident.  Marland Kitchen PONV (postoperative nausea and vomiting)   . Peripheral vascular disease ([place]) 02/2010    blood clot related to porta cath  . Breast cancer ([place]) dx'd 2005/2011  . Bone metastases ([place]) dx'd 05/2014  . S/P radiation therapy 07/17/2014 through 08/02/2014     Left mediastinum, left seventh rib 3250 cGy in 13 sessions     Past Surgical History  Procedure Laterality Date  . Breast lumpectomy  2005  . Axillary lymph node dissection  Dec. 2011  . Portacath placement  12/11  . Removal portacath    . Mediastinotomy chamberlain mcneil Left 06/02/2013    Procedure: MEDIASTINOTOMY CHAMBERLAIN MCNEIL;  Surgeon: [name], MD;  Location: [place];  Service: Thoracic;  Laterality: Left;  LEFT ANTERIOR MEDIASTINOTOMY     There were no vitals filed for this visit.  Visit Diagnosis:  Muscle stiffness  Groin pain, right  Pain in joint involving right pelvic region and thigh      Subjective Assessment - 01/15/15 1024    Subjective "I walked a mile and a half/two miles on Saturday and Sunday.  It's still tight."   Pain Score 4    Pain Location Axilla   Pain Orientation Right   Pain Relieving Factors  Flexitouch pump helped a little.                         [facility] Adult PT Treatment/Exercise - 01/15/15 0001    Manual Therapy   Manual Therapy Soft tissue mobilization;Myofascial release;Internal Pelvic Floor   Soft tissue mobilization right inner thigh, hip adductors                PT Education - 01/15/15 1612    Education provided No          PT Short Term Goals - 01/10/15 0842    PT SHORT TERM GOAL #1   Title pain with walking decreased >/= 25%   Time 3   Period Weeks   Status On-going   PT SHORT TERM GOAL #2   Title pelvis stays in correct alignment for 3 weeks   Time 3   Period Weeks   Status On-going           PT Long Term Goals - 01/03/15 1319    PT LONG TERM GOAL #1   Title indpendent with HEP   Time 6   Period Weeks   Status New   PT LONG TERM GOAL #2   Title pain with walking decreased >/= 80%   Time 6   Period Weeks   Status New   PT LONG TERM GOAL #3   Title understand how to perfrom abdominal massage for constipation  Time 6   Period Weeks   Status New           Long Term Clinic Goals - 01/12/15 1233    CC Long Term Goal  #1   Title Reports left upper back and left side pain is controlled at 4/10 or less with therapy at the current frequency.   Status On-going   CC Long Term Goal  #2   Title Pt. will report swelling is adequately managed to enable ADL function at a consistent level.   Status On-going            Plan - 01/15/15 1613    Clinical Impression Statement Patient is a 54 year  old female with right groin pain.  Patient has tightness located in right hip adductors.  Patient has increased mobility of right hip adductors compared to last week. Patient would benefit from physical therapy to reduce hip adductor pain.    Pt will benefit from skilled therapeutic intervention in order to improve on the following deficits Pain;Increased muscle spasms   Rehab Potential Good   Clinical Impairments Affecting  Rehab Potential active cancer   PT Frequency 2x / week   PT Duration 12 weeks   PT Treatment/Interventions Therapeutic activities;Manual techniques;Therapeutic exercise;Patient/family education;Passive range of motion   PT Next Visit Plan soft tissue work, discharge   PT Home Exercise Plan current HEP   Consulted and Agree with Plan of Care Patient   PT Plan Continue manual lymph drainage, soft tissue work.        Problem List Patient Active Problem List   Diagnosis Date Noted  . Nausea with vomiting 11/18/2014  . Constipation 11/18/2014  . Left-sided thoracic back pain   . Bone metastases ([place]) 11/16/2014  . Back pain 11/15/2014  . 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 Uncontrolled pain 11/14/2014  . 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 Chest wall pain 03/21/2014  . Abnormal LFTs (liver function tests) 09/12/2013  . Pleural effusion 08/18/2013  . Breast cancer of upper-inner quadrant of right female breast ([place]) 08/18/2013  . 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 [place], [place], [address] Phone: [phone]   Fax:  [phone]  Name: [name] [name] MRN: [id number] Date of Birth: 10-05-1960

## 2015-01-15 NOTE — Therapy (Signed)
[place], [place], [address] Phone: [phone]   Fax:  [phone]  Physical Therapy Treatment  Patient Details  Name: [name] [name] MRN: [id number] Date of Birth: 03-06-1961 Referring Provider: Dr. Lurline Del  Encounter Date: 01/15/2015      PT End of Session - 01/15/15 2149    Visit Number 79  Cancer Rehab only   Number of Visits 88   Date for PT Re-Evaluation 02/14/15   PT Start Time 8299   PT Stop Time 1102   PT Time Calculation (min) 39 min   Activity Tolerance Patient tolerated treatment well   Behavior During Therapy [facility] for tasks assessed/performed      Past Medical History  Diagnosis Date  . Seizures ([place]) 2010    Isolated incident.  Marland Kitchen PONV (postoperative nausea and vomiting)   . Peripheral vascular disease ([place]) 02/2010    blood clot related to porta cath  . Breast cancer ([place]) dx'd 2005/2011  . Bone metastases ([place]) dx'd 05/2014  . S/P radiation therapy 07/17/2014 through 08/02/2014     Left mediastinum, left seventh rib 3250 cGy in 13 sessions     Past Surgical History  Procedure Laterality Date  . Breast lumpectomy  2005  . Axillary lymph node dissection  Dec. 2011  . Portacath placement  12/11  . Removal portacath    . Mediastinotomy chamberlain mcneil Left 06/02/2013    Procedure: MEDIASTINOTOMY CHAMBERLAIN MCNEIL;  Surgeon: [name], MD;  Location: [place];  Service: Thoracic;  Laterality: Left;  LEFT ANTERIOR MEDIASTINOTOMY     There were no vitals filed for this visit.  Visit Diagnosis:  Lymphedema  Muscle stiffness  Intermittent low back pain      Subjective Assessment - 01/15/15 1024    Subjective "I walked a mile and a half/two miles on Saturday and Sunday.  It's still tight."   Pain Score 4    Pain Location Axilla   Pain Orientation Right   Pain  Relieving Factors Flexitouch pump helped a little.            [facility] PT Assessment - 01/15/15 0001    Assessment   Referring Provider Dr. Lurline Del                     [facility] Adult PT Treatment/Exercise - 01/15/15 2148    Manual Therapy   Manual Lymphatic Drainage (MLD) In left sidelying, posterior interaxillary anastomosis and right axillo-inguinal anastomosis; in supine, short neck, left axilla and anterior interaxillary anastomosis, right groin and axillo-inguinal anastomosis, area between right breast scars, directing toward pathways.  In right sidelying, left periscapular area toward left groin.   Manual Therapy Other (comment)  soft tissue work to left back and neck for pain relief                PT Education - 01/15/15 1612    Education provided No          PT Short Term Goals - 01/10/15 0842    PT SHORT TERM GOAL #1   Title pain with walking decreased >/= 25%   Time 3   Period Weeks   Status On-going   PT SHORT TERM GOAL #2   Title pelvis stays in correct alignment for 3 weeks   Time 3   Period Weeks   Status On-going           PT Long Term Goals - 01/03/15 1319  PT LONG TERM GOAL #1   Title indpendent with HEP   Time 6   Period Weeks   Status New   PT LONG TERM GOAL #2   Title pain with walking decreased >/= 80%   Time 6   Period Weeks   Status New   PT LONG TERM GOAL #3   Title understand how to perfrom abdominal massage for constipation   Time 6   Period Weeks   Status New           Long Term Clinic Goals - 01/12/15 1233    CC Long Term Goal  #1   Title Reports left upper back and left side pain is controlled at 4/10 or less with therapy at the current frequency.   Status On-going   CC Long Term Goal  #2   Title Pt. will report swelling is adequately managed to enable ADL function at a consistent level.   Status On-going            Plan - 01/15/15 2150    Clinical Impression Statement Patient with  very slightly decreased induration at right lateral breast area between scars, but still very full.  Reports slightly less pain than on recent visits today.   Pt will benefit from skilled therapeutic intervention in order to improve on the following deficits Increased edema;Increased fascial restricitons;Pain   Rehab Potential Good   Clinical Impairments Affecting Rehab Potential active cancer   PT Frequency 2x / week   PT Duration 12 weeks   PT Treatment/Interventions Manual lymph drainage;Manual techniques   PT Next Visit Plan For Cancer Rehab, continue  manual lymph drainage and soft tissue work.   Consulted and Agree with Plan of Care Patient        Problem List Patient Active Problem List   Diagnosis Date Noted  . Nausea with vomiting 11/18/2014  . Constipation 11/18/2014  . Left-sided thoracic back pain   . Bone metastases ([place]) 11/16/2014  . Back pain 11/15/2014  . 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 Uncontrolled pain 11/14/2014  . 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 Chest wall pain 03/21/2014  . Abnormal LFTs (liver function tests) 09/12/2013  . Pleural effusion 08/18/2013  . Breast cancer of upper-inner quadrant of right female breast ([place]) 08/18/2013  . 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

## 2015-01-16 ENCOUNTER — Other Ambulatory Visit (HOSPITAL_BASED_OUTPATIENT_CLINIC_OR_DEPARTMENT_OTHER): Payer: 59

## 2015-01-16 DIAGNOSIS — C50211 Malignant neoplasm of upper-inner quadrant of right female breast: Secondary | ICD-10-CM

## 2015-01-16 LAB — CBC WITH DIFFERENTIAL/PLATELET
BASO%: 0.6 % (ref 0.0–2.0)
Basophils Absolute: 0 10*3/uL (ref 0.0–0.1)
EOS%: 1.9 % (ref 0.0–7.0)
Eosinophils Absolute: 0.1 10*3/uL (ref 0.0–0.5)
HEMATOCRIT: 39 % (ref 34.8–46.6)
HEMOGLOBIN: 13.2 g/dL (ref 11.6–15.9)
LYMPH#: 0.4 10*3/uL — AB (ref 0.9–3.3)
LYMPH%: 7.7 % — ABNORMAL LOW (ref 14.0–49.7)
MCH: 30.6 pg (ref 25.1–34.0)
MCHC: 33.7 g/dL (ref 31.5–36.0)
MCV: 90.7 fL (ref 79.5–101.0)
MONO#: 0.5 10*3/uL (ref 0.1–0.9)
MONO%: 8.5 % (ref 0.0–14.0)
NEUT%: 81.3 % — ABNORMAL HIGH (ref 38.4–76.8)
NEUTROS ABS: 4.7 10*3/uL (ref 1.5–6.5)
Platelets: 410 10*3/uL — ABNORMAL HIGH (ref 145–400)
RBC: 4.3 10*6/uL (ref 3.70–5.45)
RDW: 13.7 % (ref 11.2–14.5)
WBC: 5.7 10*3/uL (ref 3.9–10.3)

## 2015-01-16 LAB — COMPREHENSIVE METABOLIC PANEL (CC13)
ALBUMIN: 3.7 g/dL (ref 3.5–5.0)
ALT: 79 U/L — AB (ref 0–55)
AST: 44 U/L — AB (ref 5–34)
Alkaline Phosphatase: 147 U/L (ref 40–150)
Anion Gap: 8 mEq/L (ref 3–11)
BILIRUBIN TOTAL: 0.31 mg/dL (ref 0.20–1.20)
BUN: 9.7 mg/dL (ref 7.0–26.0)
CALCIUM: 9.9 mg/dL (ref 8.4–10.4)
CHLORIDE: 103 meq/L (ref 98–109)
CO2: 27 mEq/L (ref 22–29)
CREATININE: 0.7 mg/dL (ref 0.6–1.1)
EGFR: >90 mL/min/{1.73_m2} (ref >90)
Glucose: 90 mg/dl (ref 70–140)
Potassium: 4 mEq/L (ref 3.5–5.1)
Sodium: 138 mEq/L (ref 136–145)
TOTAL PROTEIN: 7.4 g/dL (ref 6.4–8.3)

## 2015-01-17 ENCOUNTER — Ambulatory Visit (HOSPITAL_BASED_OUTPATIENT_CLINIC_OR_DEPARTMENT_OTHER): Payer: 59

## 2015-01-17 ENCOUNTER — Ambulatory Visit (HOSPITAL_BASED_OUTPATIENT_CLINIC_OR_DEPARTMENT_OTHER): Payer: 59 | Admitting: Oncology

## 2015-01-17 ENCOUNTER — Telehealth: Payer: Self-pay | Admitting: Oncology

## 2015-01-17 DIAGNOSIS — R0789 Other chest pain: Secondary | ICD-10-CM

## 2015-01-17 DIAGNOSIS — C771 Secondary and unspecified malignant neoplasm of intrathoracic lymph nodes: Secondary | ICD-10-CM

## 2015-01-17 DIAGNOSIS — Z86718 Personal history of other venous thrombosis and embolism: Secondary | ICD-10-CM | POA: Diagnosis not present

## 2015-01-17 DIAGNOSIS — C50211 Malignant neoplasm of upper-inner quadrant of right female breast: Secondary | ICD-10-CM | POA: Diagnosis not present

## 2015-01-17 DIAGNOSIS — Z5111 Encounter for antineoplastic chemotherapy: Secondary | ICD-10-CM | POA: Diagnosis not present

## 2015-01-17 MED ORDER — FULVESTRANT 250 MG/5ML IM SOLN
500.0000 mg | Freq: Once | INTRAMUSCULAR | Status: AC
  Administered 2015-01-17: 500 mg via INTRAMUSCULAR
  Filled 2015-01-17: qty 10

## 2015-01-17 NOTE — Progress Notes (Signed)
FMLA Paperwork filled out by Probation officer, signed by Dr. Jana Hakim and given to patient.

## 2015-01-17 NOTE — Telephone Encounter (Signed)
Appointments made and avs printed °

## 2015-01-18 ENCOUNTER — Ambulatory Visit: Payer: 59 | Admitting: Physical Therapy

## 2015-01-18 DIAGNOSIS — R1031 Right lower quadrant pain: Secondary | ICD-10-CM | POA: Diagnosis not present

## 2015-01-18 NOTE — Patient Instructions (Addendum)
ABDUCTION: Standing (Active)    Stand, feet flat. Lift right leg out to side. Use _0__ lbs. Complete _10__ sets of __3_ repetitions. Perform _1__ sessions per day.  http://gtsc.exer.us/111   Copyright  VHI. All rights reserved.  [place] [facility] [address] [place], [place] [address] Phone # [phone] Fax [phone]

## 2015-01-18 NOTE — Therapy (Signed)
[facility] [address] [place], [place], [address] Phone: [phone]   Fax:  [phone]  Physical Therapy Treatment  Patient Details  Name: [name] [name] MRN: [id number] Date of Birth: 03/08/1961 Referring Provider: Dr. Humberto Seals  Encounter Date: 01/18/2015      PT End of Session - 01/18/15 1007    Visit Number 5  pelvis   Date for PT Re-Evaluation 02/14/15   PT Start Time 0930   PT Stop Time 1010   PT Time Calculation (min) 40 min   Activity Tolerance Patient tolerated treatment well   Behavior During Therapy [facility] for tasks assessed/performed      Past Medical History  Diagnosis Date  . Seizures ([place]) 2010    Isolated incident.  Marland Kitchen PONV (postoperative nausea and vomiting)   . Peripheral vascular disease ([place]) 02/2010    blood clot related to porta cath  . Breast cancer ([place]) dx'd 2005/2011  . Bone metastases ([place]) dx'd 05/2014  . S/P radiation therapy 07/17/2014 through 08/02/2014     Left mediastinum, left seventh rib 3250 cGy in 13 sessions     Past Surgical History  Procedure Laterality Date  . Breast lumpectomy  2005  . Axillary lymph node dissection  Dec. 2011  . Portacath placement  12/11  . Removal portacath    . Mediastinotomy chamberlain mcneil Left 06/02/2013    Procedure: MEDIASTINOTOMY CHAMBERLAIN MCNEIL;  Surgeon: [name], MD;  Location: [place];  Service: Thoracic;  Laterality: Left;  LEFT ANTERIOR MEDIASTINOTOMY     There were no vitals filed for this visit.  Visit Diagnosis:  Groin pain, right      Subjective Assessment - 01/18/15 0934    Subjective I feel better.  today is my last day.  I have a twinge in right groin.    Pertinent History patient vomited just prior to treatment today   How long can you walk comfortably? make patient limp   Patient Stated Goals walking with  minimal pain   Currently in Pain? Yes   Pain Score 3    Pain Location Hip   Pain Orientation Right   Pain Descriptors / Indicators Tender   Pain Type Acute pain   Pain Onset More than a month ago   Pain Frequency Intermittent   Aggravating Factors  walk or drive   Pain Relieving Factors off feet   Multiple Pain Sites No            [facility] PT Assessment - 01/18/15 0001    Assessment   Medical Diagnosis perirectal rectal/perineal pain   Referring Provider Dr. Sarajane Jews [place]   Onset Date/Surgical Date 11/30/14   Prior Therapy None   Precautions   Precautions Other (comment)   Precaution Comments No ultrasound   Balance Screen   Has the patient fallen in the past 6 months No   Has the patient had a decrease in activity level because of a fear of falling?  No   Is the patient reluctant to leave their home because of a fear of falling?  No   Prior Function   Level of Independence Independent   Cognition   Overall Cognitive Status Within Functional Limits for tasks assessed   Strength   Overall Strength Comments right hamstring  5/5 without pain   Right Hip ABduction 4-/5   Palpation   SI assessment  pelvis in correct alignment  [facility] Adult PT Treatment/Exercise - 01/18/15 0001    Manual Therapy   Manual Therapy Soft tissue mobilization;Myofascial release;Internal Pelvic Floor   Soft tissue mobilization right hip adductor, right obturator internist, around right greater trochanter, right hamstring insertion on ischial tuberosity                PT Education - 01/18/15 0941    Education provided Yes   Education Details hip abduction in standing   Person(s) Educated Patient   Methods Explanation;Demonstration;Verbal cues;Handout   Comprehension Returned demonstration;Verbalized understanding          PT Short Term Goals - 01/18/15 1009    PT SHORT TERM GOAL #1   Title pain with walking decreased >/= 25%   Time 3   Period  Weeks   Status Achieved   PT SHORT TERM GOAL #2   Title pelvis stays in correct alignment for 3 weeks   Time 3   Period Weeks           PT Long Term Goals - 01/18/15 1009    PT LONG TERM GOAL #1   Title indpendent with HEP   Time 6   Period Weeks   Status Achieved   PT LONG TERM GOAL #2   Title pain with walking decreased >/= 80%   Time 6   Period Weeks   Status Achieved   PT LONG TERM GOAL #3   Title understand how to perfrom abdominal massage for constipation   Time 6   Period Weeks   Status Achieved           Long Term Clinic Goals - 01/12/15 1233    CC Long Term Goal  #1   Title Reports left upper back and left side pain is controlled at 4/10 or less with therapy at the current frequency.   Status On-going   CC Long Term Goal  #2   Title Pt. will report swelling is adequately managed to enable ADL function at a consistent level.   Status On-going            Plan - 01/18/15 1010    Clinical Impression Statement Patient ahs met all of her goals.  Her pelvis is in correct alignment.  Right hip abduction 4-/5.  Patient is being discharged due to meeting all of her goals.    Pt will benefit from skilled therapeutic intervention in order to improve on the following deficits Pain;Increased muscle spasms   Rehab Potential Good   Clinical Impairments Affecting Rehab Potential active cancer   PT Treatment/Interventions Therapeutic exercise;Therapeutic activities;Manual techniques;Patient/family education   PT Next Visit Plan Discharge right groin pain to HEP. Patient is to continue to be treated for her lymphedema   PT Home Exercise Plan current HEP   Consulted and Agree with Plan of Care Patient   PT Plan Continue manual lymph drainage, soft tissue work.        Problem List Patient Active Problem List   Diagnosis Date Noted  . Nausea with vomiting 11/18/2014  . Constipation 11/18/2014  . Left-sided thoracic back pain   . Bone metastases ([place]) 11/16/2014   . Back pain 11/15/2014  . 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 Uncontrolled pain 11/14/2014  . 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 Chest wall pain 03/21/2014  . Abnormal LFTs (liver function tests) 09/12/2013  . Pleural effusion 08/18/2013  . Breast cancer of upper-inner quadrant of right female breast ([place]) 08/18/2013  .  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 [place], [place], [address] Phone: [phone]   Fax:  [phone]  Name: [name] [name] MRN: [id number] Date of Birth: [birth year]  PHYSICAL THERAPY DISCHARGE SUMMARY  Visits from Start of Care: 5  Current functional level related to goals / functional outcomes: See above   Remaining deficits: See above   Education / Equipment: HEP Plan: Patient agrees to discharge.  Patient goals were met. Patient is being discharged due to meeting the stated rehab goals. Thank you for the referral. [name], PT 01/18/2015 10:15 AM   ?????

## 2015-01-19 ENCOUNTER — Ambulatory Visit: Payer: 59 | Admitting: Physical Therapy

## 2015-01-19 DIAGNOSIS — M549 Dorsalgia, unspecified: Secondary | ICD-10-CM

## 2015-01-19 DIAGNOSIS — I890 Lymphedema, not elsewhere classified: Secondary | ICD-10-CM

## 2015-01-19 DIAGNOSIS — M25551 Pain in right hip: Secondary | ICD-10-CM

## 2015-01-19 NOTE — Therapy (Signed)
[place], [place], [address] Phone: [phone]   Fax:  [phone]  Physical Therapy Treatment  Patient Details  Name: [name] [name] MRN: [id number] Date of Birth: 15-Sep-1960 Referring Provider: Dr. Humberto Seals  Encounter Date: 01/19/2015      PT End of Session - 01/19/15 1209    Visit Number 28  Cancer Rehab only   Number of Visits 88   Date for PT Re-Evaluation 02/14/15   PT Start Time 1023   PT Stop Time 1105   PT Time Calculation (min) 42 min   Activity Tolerance Patient tolerated treatment well   Behavior During Therapy [facility] for tasks assessed/performed      Past Medical History  Diagnosis Date  . Seizures ([place]) 2010    Isolated incident.  Marland Kitchen PONV (postoperative nausea and vomiting)   . Peripheral vascular disease ([place]) 02/2010    blood clot related to porta cath  . Breast cancer ([place]) dx'd 2005/2011  . Bone metastases ([place]) dx'd 05/2014  . S/P radiation therapy 07/17/2014 through 08/02/2014     Left mediastinum, left seventh rib 3250 cGy in 13 sessions     Past Surgical History  Procedure Laterality Date  . Breast lumpectomy  2005  . Axillary lymph node dissection  Dec. 2011  . Portacath placement  12/11  . Removal portacath    . Mediastinotomy chamberlain mcneil Left 06/02/2013    Procedure: MEDIASTINOTOMY CHAMBERLAIN MCNEIL;  Surgeon: [name], MD;  Location: [facility];  Service: Thoracic;  Laterality: Left;  LEFT ANTERIOR MEDIASTINOTOMY     There were no vitals filed for this visit.  Visit Diagnosis:  Lymphedema  Pain in joint involving right pelvic region and thigh  Acute back pain      Subjective Assessment - 01/19/15 1027    Subjective Got a hairpiece for the bald spot.   Currently in Pain? Yes   Pain Score 4    Pain Location Axilla  and both upper traps   Pain  Orientation Right                         [facility] Adult PT Treatment/Exercise - 01/19/15 0001    Manual Therapy   Manual Therapy Other (comment)   Manual Lymphatic Drainage (MLD) In left sidelying, posterior interaxillary anastomosis and right axillo-inguinal anastomosis; in supine, short neck, left axilla and anterior interaxillary anastomosis, right groin and axillo-inguinal anastomosis, area between right breast scars, directing toward pathways.  In right sidelying, left periscapular area toward left groin.   Other Manual Therapy soft tissue work to right posteromedial thigh in right sidelying; with Biotone to bilateral upper back and neck                PT Education - 01/18/15 0941    Education provided Yes   Education Details hip abduction in standing   Person(s) Educated Patient   Methods Explanation;Demonstration;Verbal cues;Handout   Comprehension Returned demonstration;Verbalized understanding          PT Short Term Goals - 01/18/15 1009    PT SHORT TERM GOAL #1   Title pain with walking decreased >/= 25%   Time 3   Period Weeks   Status Achieved   PT SHORT TERM GOAL #2   Title pelvis stays in correct alignment for 3 weeks   Time 3   Period Weeks           PT Long Term  Goals - 01/18/15 1009    PT LONG TERM GOAL #1   Title indpendent with HEP   Time 6   Period Weeks   Status Achieved   PT LONG TERM GOAL #2   Title pain with walking decreased >/= 80%   Time 6   Period Weeks   Status Achieved   PT LONG TERM GOAL #3   Title understand how to perfrom abdominal massage for constipation   Time 6   Period Weeks   Status Achieved           Long Term Clinic Goals - 01/19/15 1213    CC Long Term Goal  #1   Title Reports left upper back and left side pain is controlled at 4/10 or less with therapy at the current frequency.   Baseline Goal is currently being met and is ongoing.   Status On-going   CC Long Term Goal  #2   Title Pt.  will report swelling is adequately managed to enable ADL function at a consistent level.   Baseline Swelling is being managed; patient hopes to be able to use her pump soon.  Goal is ongoing.   Status On-going   CC Long Term Goal  #3   Title Pain in area of right ischial tuberosity will be reduced to 1/10 or less, and patient will no longer need assistive device for ambulation.   Status Partially Met            Plan - 01/19/15 1210    Clinical Impression Statement Patient with continued swelling and induration at right lateral breast area between and around incision scars, accompanied by discomfort.  Also with tightness at right hamstring and at upper traps today.  All benefitted from manual therapy.   Pt will benefit from skilled therapeutic intervention in order to improve on the following deficits Increased edema;Pain;Increased fascial restricitons   Rehab Potential Good   Clinical Impairments Affecting Rehab Potential active cancer   PT Frequency 2x / week   PT Duration 12 weeks   PT Treatment/Interventions Manual lymph drainage;Manual techniques   PT Next Visit Plan Continue with manual lymph drainage and soft tissue work.   Consulted and Agree with Plan of Care Patient        Problem List Patient Active Problem List   Diagnosis Date Noted  . Nausea with vomiting 11/18/2014  . Constipation 11/18/2014  . Left-sided thoracic back pain   . Bone metastases ([place]) 11/16/2014  . Back pain 11/15/2014  . 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 Uncontrolled pain 11/14/2014  . 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 Chest wall pain 03/21/2014  . Abnormal LFTs (liver function tests) 09/12/2013  . Pleural effusion 08/18/2013  . Breast cancer of upper-inner quadrant of right female breast ([place]) 08/18/2013  . 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

## 2015-01-19 NOTE — Progress Notes (Signed)
[facility]  [phone] [phone]     ID: [name] [name] OB: Aug 26, 1960  MR#: [id number]  SFK#:[id number]  PCP: Pcp Not In System GYN:  [name] SU:  OTHER MD: [name], [name], [name], [name], [name]  CHIEF COMPLAINT: Stage IV breast cancer  CURRENT TREATMENT: Fulvestrant  BREAST CANCER HISTORY: From doctor [name]'s intake note 03/20/2004:  "The patient is a very pleasant 54 year old female, without significant past medical history.  Her family history is significant for a sister who at age 7 was diagnosed with invasive ductal carcinoma.  She is a breast cancer survivor at age 89 now.  The patient states that she has never really had a screening mammogram until October 2005, when she felt that it was time for her to start having mammograms done on a yearly basis.  Therefore, on 01/26/04, she underwent a screening mammogram and an abnormality was detected in the upper outer right breast.  She, therefore, underwent spot compression views of both the right and the left breast.  The left breast revealed a well-defined mass in the upper outer left quadrant, present at the 2 o'clock position, measuring 1.8 cm, 6 cm from the nipple.  This, by ultrasound, was felt to be a simple cyst measuring 1.8 cm.  On the right breast, a spiculated mass was noted in the upper outer right quadrant.  The ultrasound revealed a shadowing irregular solid mass at the 10:30 position, 9 cm from the nipple, measuring 1.2 cm in greatest dimension, correlating with the spiculated mass seen on the mammogram.  The right axilla was negative ultrasonically.  Because of this, the patient underwent a needle biopsy of the right breast and the biopsy was positive invasive mammary carcinoma that showed features consistent with a high-grade invasive ductal carcinoma associated with desmoplastic stroma.  No in situ component was seen  and no definite lymphovascular invasion was identified.  On the core biopsy, the tumor measured about 0.8 cm.  Because of this, she was seen by Dr. [name] and the patient was taken to the [facility] on March 15, 2004.  She underwent a right breast lumpectomy with sentinel node biopsy.  The final pathology revealed an invasive ductal carcinoma, measuring 1.7 cm, grade 2 of 3.  Margins were free of tumor.  Atypical lobular hyperplasia was noted.  One sentinel node was removed which was negative for metastatic disease.  The tumor was staged at T1c, N0 MX.  It was estrogen receptor positive, progesterone receptor positive.  HER-2/neu was 2+.  FISH was negative.  All margins were free of tumor.  She is now seen in Medical Oncology for further evaluation and management of this newly diagnosed T1c, node negative, stage I, invasive ductal carcinoma of the right breast."  Her subsequent history is as detailed below  INTERVAL HISTORY: [name] [name] returns today for follow-up of her breast cancer. Her husband [name] [name] did not come with her and she said that was delivered because she had some questions she wanted to ask me. Today she will receive her third dose of fulvestrant, after which she will receive it every 28 days, instead of every 2 weeks.--. She is tolerating the shots well, although she feels perhaps her hot flashes are a little bit increased, particularly at night. She is also having some bone aches and some cramps particularly in the right hand when holding the hairdryer. She wonders if that could be related to the shots.  REVIEW OF SYSTEMS: [name] [name] is planning a trip between October 27 and November 5, where they will drive. She wanted to know if I thought she would ever be able to fly again and I think short trips probably would be okay at this point since she is so stable. Sometimes she has a sensation of fullness in her left axilla. Her hair is thinning. She is very concerned that sometimes physicians don't  pay attention to her list of allergies. She is having bowel movements pretty much every day and there are sometimes a little lighter in color. She wanted to check your hepatitis studies from September 2011 and they were all clear (we'll looked it up today). She is exercising now more regularly and walked 2 miles a Saturday and 2 miles on Sunday with no particular shortness of breath and minimal hip pain. This was very favorable. She's also having a little bit of left rib cage pain at times. Her cough is still present but it is decreased. A detailed review of systems today was otherwise stable  PAST MEDICAL HISTORY: Past Medical History  Diagnosis Date  . Seizures ([place]) 2010    Isolated incident.  Marland Kitchen PONV (postoperative nausea and vomiting)   . Peripheral vascular disease ([place]) 02/2010    blood clot related to porta cath  . Breast cancer ([place]) dx'd 2005/2011  . Bone metastases ([place]) dx'd 05/2014  . S/P radiation therapy 07/17/2014 through 08/02/2014     Left mediastinum, left seventh rib 3250 cGy in 13 sessions     PAST SURGICAL HISTORY: Past Surgical History  Procedure Laterality Date  . Breast lumpectomy  2005  . Axillary lymph node dissection  Dec. 2011  . Portacath placement  12/11  . Removal portacath    . Mediastinotomy chamberlain mcneil Left 06/02/2013    Procedure: MEDIASTINOTOMY CHAMBERLAIN MCNEIL;  Surgeon: [name], MD;  Location: [facility] OR;  Service: Thoracic;  Laterality: Left;  LEFT ANTERIOR MEDIASTINOTOMY     FAMILY HISTORY Family History  Problem Relation Age of Onset  . COPD Mother   . Breast cancer Sister 24   The patient's father is living, 41 years old as of may 2015. He lives in [place]. The patient's mother died from complications of COPD at the age of 49. These has 2 brothers, one sister. Her sister developed breast cancer at the age of 38. She is doing well. The patient  herself underwent genetic testing at [facility] in 2011 and was found to be BRCA negative  GYNECOLOGIC HISTORY:  Menarche age 36, she is GX P0. She stopped having periods with her initial chemotherapy in 2006.  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 Her husband [name] [name] is a Medical illustrator in [place]. He has a child from a prior marriage. At home they have 2 rescue dogs, Hobo and [place]. The patient is religious but not a church attender    ADVANCED DIRECTIVES: In place; at the 08/04/2014 visit in particular the patient was very clear, with her husband present, that she would not want any kind of feeding tubes or "other tubes" if her condition deteriorated.   HEALTH MAINTENANCE: Social History  Substance Use Topics  . Smoking status: Never Smoker   . Smokeless tobacco: Never Used  . Alcohol Use: No     Colonoscopy:  PAP:  Bone density: March 2015; mild osteopenia  Lipid panel:  Allergies  Allergen Reactions  .  Decadron [Dexamethasone] Other (See Comments)    Patient does not tolerate steroids.   . Dilaudid [Hydromorphone] Nausea And Vomiting  . Fluconazole Swelling    Liver toxicity  . Hydromorphone Hcl Nausea And Vomiting  . Morphine And Related Nausea And Vomiting  . Protonix [Pantoprazole Sodium] Other (See Comments)    Patient reports it caused thrush.  . Tegaderm Ag Mesh [Silver]     Current Outpatient Prescriptions  Medication Sig Dispense Refill  . ALPRAZolam (XANAX) 0.5 MG tablet Take 1 tablet (0.5 mg total) by mouth 2 (two) times daily as needed for anxiety. (Patient taking differently: Take 0.125 mg by mouth 2 (two) times daily as needed for anxiety. ) 30 tablet 0  . B Complex-C (B-COMPLEX WITH VITAMIN C) tablet Take 1 tablet by mouth daily.    . cholecalciferol 2000 UNITS tablet Take 1 tablet (2,000 Units total) by mouth daily.    . folic acid (FOLVITE) 1 MG tablet Take 1 tablet (1 mg total)  by mouth daily.    Marland Kitchen ibuprofen (ADVIL,MOTRIN) 200 MG tablet Take 400 mg by mouth every 4 (four) hours as needed for moderate pain.     . Melatonin 3 MG TABS Take 3 mg by mouth at bedtime.    Marland Kitchen omeprazole (PRILOSEC) 20 MG capsule Take 20 mg by mouth 2 (two) times daily at 10 AM and 5 PM.     No current facility-administered medications for this visit.    OBJECTIVE: Middle-aged white woman who appears stated age There were no vitals filed for this visit.   There is no weight on file to calculate BMI.   There were no vitals filed for this visit.    Patient refused vitals today    ECOG FS:1 - Symptomatic but completely ambulatory  Sclerae unicteric, pupils round and equal Oropharynx clear and moist-- no thrush or other lesions No cervical or supraclavicular adenopathy Lungs no rales or rhonchi Heart regular rate and rhythm Abd soft, nontender, positive bowel sounds MSK no focal spinal tenderness, no upper extremity lymphedema Neuro: nonfocal, well oriented, appropriate affect Breasts: Deferred  LAB RESULTS:   CMP     Component Value Date/Time   NA 138 01/16/2015 1023   NA 140 12/14/2014 0800   K 4.0 01/16/2015 1023   K 3.6 12/14/2014 0800   CL 103 12/14/2014 0800   CL 105 05/06/2012 1333   CO2 27 01/16/2015 1023   CO2 28 12/14/2014 0800   GLUCOSE 90 01/16/2015 1023   GLUCOSE 100* 12/14/2014 0800   GLUCOSE 124* 05/06/2012 1333   BUN 9.7 01/16/2015 1023   BUN 6 12/14/2014 0800   CREATININE 0.7 01/16/2015 1023   CREATININE 0.64 12/14/2014 0800   CALCIUM 9.9 01/16/2015 1023   CALCIUM 9.6 12/14/2014 0800   PROT 7.4 01/16/2015 1023   PROT 5.6* 11/20/2014 0433   ALBUMIN 3.7 01/16/2015 1023   ALBUMIN 2.6* 11/20/2014 0433   AST 44* 01/16/2015 1023   AST 28 11/20/2014 0433   ALT 79* 01/16/2015 1023   ALT 62* 11/20/2014 0433   ALKPHOS 147 01/16/2015 1023   ALKPHOS 105 11/20/2014 0433   BILITOT 0.31 01/16/2015 1023   BILITOT 0.7 11/20/2014 0433   GFRNONAA >60 12/14/2014  0800   GFRAA >60 12/14/2014 0800    No results found for: SPEP  Lab Results  Component Value Date   WBC 5.7 01/16/2015   NEUTROABS 4.7 01/16/2015   HGB 13.2 01/16/2015   HCT 39.0 01/16/2015   MCV 90.7 01/16/2015  PLT 410* 01/16/2015      Chemistry      Component Value Date/Time   NA 138 01/16/2015 1023   NA 140 12/14/2014 0800   K 4.0 01/16/2015 1023   K 3.6 12/14/2014 0800   CL 103 12/14/2014 0800   CL 105 05/06/2012 1333   CO2 27 01/16/2015 1023   CO2 28 12/14/2014 0800   BUN 9.7 01/16/2015 1023   BUN 6 12/14/2014 0800   CREATININE 0.7 01/16/2015 1023   CREATININE 0.64 12/14/2014 0800      Component Value Date/Time   CALCIUM 9.9 01/16/2015 1023   CALCIUM 9.6 12/14/2014 0800   ALKPHOS 147 01/16/2015 1023   ALKPHOS 105 11/20/2014 0433   AST 44* 01/16/2015 1023   AST 28 11/20/2014 0433   ALT 79* 01/16/2015 1023   ALT 62* 11/20/2014 0433   BILITOT 0.31 01/16/2015 1023   BILITOT 0.7 11/20/2014 0433       Lab Results  Component Value Date   LABCA2 25 11/02/2007    No components found for: CHYIF027  No results for input(s): INR in the last 168 hours.  Urinalysis    Component Value Date/Time   COLORURINE YELLOW 11/17/2014 0143   APPEARANCEUR CLOUDY* 11/17/2014 0143   LABSPEC 1.010 11/17/2014 0143   LABSPEC 1.005 09/12/2013 1542   PHURINE 6.5 11/17/2014 0143   PHURINE 6.0 09/12/2013 1542   GLUCOSEU NEGATIVE 11/17/2014 0143   GLUCOSEU Negative 09/12/2013 1542   HGBUR NEGATIVE 11/17/2014 0143   HGBUR Negative 09/12/2013 1542   BILIRUBINUR NEGATIVE 11/17/2014 0143   BILIRUBINUR Negative 09/12/2013 1542   KETONESUR NEGATIVE 11/17/2014 0143   KETONESUR Negative 09/12/2013 1542   PROTEINUR NEGATIVE 11/17/2014 0143   PROTEINUR Negative 09/12/2013 1542   UROBILINOGEN 0.2 11/17/2014 0143   UROBILINOGEN 0.2 09/12/2013 1542   NITRITE NEGATIVE 11/17/2014 0143   NITRITE Negative 09/12/2013 1542   LEUKOCYTESUR NEGATIVE 11/17/2014 0143   LEUKOCYTESUR  Negative 09/12/2013 1542    STUDIES: Dg Chest 2 View  01/08/2015  CLINICAL DATA:  chest pressure, worsening cough current history of breast cancer and pleural effusion EXAM: CHEST  2 VIEW COMPARISON:  CT thorax 12/26/2014 and chest radiograph 11/13/2014 FINDINGS: Status post right axillary node dissection.  Right lung is clear. On the left there is a moderate pleural effusion with loculation. Upper lobe consolidation stable from previous studies. Subsegmental atelectasis in the lingula similar to prior study. IMPRESSION: Unchanged appearance when compared to prior studies with loculated left pleural thickening/effusion, and stable left upper lobe consolidation. Electronically Signed   By: Skipper Cliche M.D.   On: 01/08/2015 15:10   Ct Chest W Contrast  12/26/2014  CLINICAL DATA:  Restaging breast cancer. Stage IV breast cancer with lung Mets and lymph node metastasis. EXAM: CT CHEST WITH CONTRAST TECHNIQUE: Multidetector CT imaging of the chest was performed during intravenous contrast administration. CONTRAST:  45m OMNIPAQUE IOHEXOL 300 MG/ML  SOLN COMPARISON:  PET-CT scan 10/30/2014, CT scan 10/12/2014 FINDINGS: Mediastinum/Nodes: Enlarged LEFT axial lymph node measures 11 mm increased from 8 mm on comparison CT scan. No supraclavicular lymphadenopathy. No mediastinal lymphadenopathy. No pericardial fluid. Esophagus normal. Lungs/Pleura: There is pleural thickening at the LEFT lung base. This is hypermetabolic on comparison PET-CT scan. There is nodular extension into the neural foramina at T11 on the LEFT. This degree of nodularity is decreased slightly measures 17 mm image 42, series 2 compared to 26 mm on prior. The nodular thickening at the inferior pleural space is similar to slightly decreased. Multiple  nodules in the precordial space are similar to comparison exam (image 38, series 2). There are rounded atelectasis at the LEFT lung base. There is volume loss in the LEFT hemi thorax. There is a  consolidation with air bronchograms in the upper lobes suggesting radiation change. RIGHT lung demonstrates a nodule in the upper lobe on image 23, series 5 which is new from comparison exam. Upper abdomen: Several low-density lesions within the liver. One measures 8 mm on image 48, series 2 a second RIGHT hepatic lobe measures 7 mm on image 55. These are not seen on comparison exam although. And retroverted SPECT on comparison PET-CT scan there subtle activity at these locations. Nodular large round along the LEFT crus of the diaphragm measuring 2.5 cm appears slightly advanced in thickness. Musculoskeletal: No aggressive osseous lesions identified. IMPRESSION: 1. Overall pattern favors progression of breast cancer metastasis. 2. Two new small lesions in the RIGHT hepatic lobe are concerning for hepatic metastasis. 3. New nodule in the RIGHT upper lobe. 4. Nodular pleural thickening in the LEFT hemi thorax is similar prior. 5. Slight enlargement of LEFT axillary nodal metastasis. 6. Paraspinal lesion on the LEFT at T11 is slightly decreased. 7. Thickening along the LEFT crus of the diaphragm appears slightly more prominent. Electronically Signed   By: Suzy Bouchard M.D.   On: 12/26/2014 17:19    ASSESSMENT: 54 y.o. [place] woman with stage IV breast cancer, history as follows  (1)  S/p Right lumpectomy and sentinel lymph node sampling 03/15/2004 for a pT1c pN0. Stage IA invasive ductal carcinoma, grade 2, estrogen receptor 95% positive, progesterone receptor 65% positive, HER-2 not amplified; additional surgery 04/25/2004 for seroma or clearance showed no residual tumor  (2) adjuvant chemotherapy with cyclophosphamide and doxorubicin every 21 days x4 completed 07/19/2004  (3) adjuvant radiation given under Dr. Donella Stade in [place] completed July 2006  (4) the patient opted against adjuvant antiestrogen therapy  (5) genetics testing showed no BRCA mutations  (6) biopsy of a palpable right  axillary mass 10/24/2009 showed invasive ductal carcinoma, grade 3, estrogen receptor 100% positive, progesterone receptor 2% positive (alert score 5) HER-2 negative; no evidence of systemic disease on PET scanning  (7) completed 3 of 4 planned cycles of docetaxel and cyclophosphamide September 2011, fourth cycle omitted because of marked elevations in liver function tests  (8) an right axillary lymph node dissection 03/06/2010 showed 3/8 lymph nodes removed to be involved by tumor, with extracapsular extension.  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, TTS-1 negative), estrogen receptor 80% positive, progesterone receptor 2% positive, HER-2 not amplified  (12) letrozole started March 2015-- tolerated with significant side effects, discontinued at the end of May 2015  (13) PET scan 08/16/2013 shows extensive left pleural metastatic disease and a large left pleural effusion that shifts cardiac and mediastinal structures to the right; adenopathy (celiac trunk, periadrenal, periaortic); and a left medial clavicular lesion; Status post left thoracentesis 08/16/2013 positive for adenocarcinoma, estrogen receptor positive, progesterone receptor negative.  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,  mutations in [facility] [id number], a complex rearrangement in PIK3R2, and amplification ofPIK3C2B]],  amplification of MCL1 and MDM4, anda MAP2K4 R287H mutation; everolimus was suggested as an available targeted agent  (18) exemestane started 03/31/2014, discontinued 10/31/2014 with evidence of progression  (a) everolimus added 04/03/2014 but not tolerated (cytopenias, elevated LFTs) even at minimal doses; stopped 04/17/2014  (19) fulvestrant started 12/20/2014    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 treated with Lovenox for 5-6 months  (c) right upper extremity lymphedema--receiving physical therapy  (d) hepatic steatosis with chronically elevated LFTs as well as unusual hepatic sensitivity to chemotherapy  (e) osteopenia with the lowest T score -1.6 on bone density scan 06/20/2013  (f) radiation oncology (Dr Valere Dross) has reviewed prior radiation records in case there is further mediastinal involvement with dysphagia etc in which case palliative XRT could be considered  (a) radiation to left mediastinum/ left 7th rib 3250 cGy in 13 sessions04/18/2016 through 08/02/2014  (b) radiation to T11 area: 22 Gy in 7 sessions, last dose 11/27/2014  (c) radiation left parietal scalp region to be completed 12/22/2014  (g) chest wall pain--improved post radiation treatments   PLAN: [name] [name] is doing much better than last visit. 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 She is tolerating the fulvestrant well, which encourages her. She is taking longer walks with less shortness of breath. All this is very favorable.  Today we addressed the Palbociclib question. I suggested perhaps she could take just one tablet once a week and see if she can tolerate that. She is very reluctant to do that because she says she is finally beginning to have some quality of life and she does  not want to miss that up. She understands I feel this could lengthen the time to progression assuming we get some good response from the fulvestrant, but for now we will have to postpone it. We will discuss it again at the next visit.  She has started using the body lymphedema drainage system and it seems to be working better for her. Of course she continues on physical therapy as well.  She is planning a short trip out when they will be driving. I think if she continues to do as well as currently she could take short airplane rides, which of course would be a lot briefer.  Otherwise she will return November 15 for her next fulvestrant dose and she will see me the same day. She knows to call for any problems that may develop before that visit.    Chauncey Cruel, MD   01/19/2015 4:48 PM

## 2015-01-20 ENCOUNTER — Encounter: Payer: Self-pay | Admitting: Radiation Oncology

## 2015-01-22 ENCOUNTER — Ambulatory Visit: Payer: 59 | Admitting: Physical Therapy

## 2015-01-22 DIAGNOSIS — I890 Lymphedema, not elsewhere classified: Secondary | ICD-10-CM | POA: Diagnosis not present

## 2015-01-22 DIAGNOSIS — M549 Dorsalgia, unspecified: Secondary | ICD-10-CM

## 2015-01-22 DIAGNOSIS — M25551 Pain in right hip: Secondary | ICD-10-CM

## 2015-01-22 NOTE — Therapy (Signed)
[place], [place], [address] Phone: [phone]   Fax:  [phone]  Physical Therapy Treatment  Patient Details  Name: [name] [name] MRN: [id number] Date of Birth: 12-08-1960 Referring Provider: Dr. Humberto Seals  Encounter Date: 01/22/2015      PT End of Session - 01/22/15 1220    Visit Number 50   Number of Visits 88   Date for PT Re-Evaluation 02/14/15   PT Start Time 1024   PT Stop Time 1107   PT Time Calculation (min) 43 min   Activity Tolerance Patient tolerated treatment well   Behavior During Therapy [facility] for tasks assessed/performed      Past Medical History  Diagnosis Date  . Seizures ([place]) 2010    Isolated incident.  Marland Kitchen PONV (postoperative nausea and vomiting)   . Peripheral vascular disease ([place]) 02/2010    blood clot related to porta cath  . Breast cancer ([place]) dx'd 2005/2011  . Bone metastases ([place]) dx'd 05/2014  . S/P radiation therapy 07/17/2014 through 08/02/2014     Left mediastinum, left seventh rib 3250 cGy in 13 sessions   . S/P radiation therapy 12/11/2014 through 12/22/2014     Left parietal calvarium 2400 cGy in 8 sessions     Past Surgical History  Procedure Laterality Date  . Breast lumpectomy  2005  . Axillary lymph node dissection  Dec. 2011  . Portacath placement  12/11  . Removal portacath    . Mediastinotomy chamberlain mcneil Left 06/02/2013    Procedure: MEDIASTINOTOMY CHAMBERLAIN MCNEIL;  Surgeon: [name], MD;  Location: [facility];  Service: Thoracic;  Laterality: Left;  LEFT ANTERIOR MEDIASTINOTOMY     There were no vitals filed for this visit.  Visit Diagnosis:  Lymphedema  Pain in joint involving right pelvic region and thigh  Acute back pain      Subjective Assessment -  01/22/15 1026    Subjective "I walked close to three miles yesterday.  Got sore by the end.."  Did the Flexitouch because I was tight, but it's tight again.   Currently in Pain? Yes   Pain Score 5    Pain Location Axilla   Pain Orientation Right   Aggravating Factors  walking                         [facility] Adult PT Treatment/Exercise - 01/22/15 0001    Manual Therapy   Manual Lymphatic Drainage (MLD) In left sidelying, posterior interaxillary anastomosis and right axillo-inguinal anastomosis; in supine, short neck, left axilla and anterior interaxillary anastomosis, right groin and axillo-inguinal anastomosis, area between right breast scars, directing toward pathways.  In right sidelying, left periscapular area toward left groin.   Other Manual Therapy soft tissue work to right posteromedial thigh in right sidelying; with Biotone to bilateral upper back and neck                  PT Short Term Goals - 01/18/15 1009    PT SHORT TERM GOAL #1   Title pain with walking decreased >/= 25%   Time 3   Period Weeks   Status Achieved   PT SHORT TERM GOAL #2   Title pelvis stays in correct alignment for 3 weeks   Time 3   Period Weeks           PT Long Term Goals - 01/18/15 1009    PT LONG TERM GOAL #1  Title indpendent with HEP   Time 6   Period Weeks   Status Achieved   PT LONG TERM GOAL #2   Title pain with walking decreased >/= 80%   Time 6   Period Weeks   Status Achieved   PT LONG TERM GOAL #3   Title understand how to perfrom abdominal massage for constipation   Time 6   Period Weeks   Status Achieved           Long Term Clinic Goals - 01/22/15 1223    CC Long Term Goal  #1   Title Reports left upper back and left side pain is controlled at 4/10 or less with therapy at the current frequency.   Baseline Goal is currently being met and is ongoing.   Status On-going   CC Long Term Goal  #2   Title Pt. will report swelling is adequately  managed to enable ADL function at a consistent level.   Baseline This is ongoing; swelling has been worse recently--less amenable to improvement with manual lymph drainage.   Status On-going            Plan - 01/22/15 1220    Clinical Impression Statement Patient has been improving with her groin area pain and now is back to walking without the cane and walking for exercise, with just a small amount of pain.  Right lateral breast tissue has remained indurated with swelling the last couple of weeks.  Now that her PleurX tube is out and that incision healed, she has been able to try the Flexitouch again.  So far it has not reduced this problem, but patient plans to use it more frequently in the next couple of weeks; she will be away during that time.   Pt will benefit from skilled therapeutic intervention in order to improve on the following deficits Increased edema;Pain;Increased fascial restricitons   Rehab Potential Good   Clinical Impairments Affecting Rehab Potential active cancer   PT Frequency 2x / week   PT Duration 12 weeks   PT Treatment/Interventions Manual lymph drainage;Manual techniques   PT Next Visit Plan Continue with manual lymph drainage and soft tissue work.   Consulted and Agree with Plan of Care Patient        Problem List Patient Active Problem List   Diagnosis Date Noted  . Nausea with vomiting 11/18/2014  . Constipation 11/18/2014  . Left-sided thoracic back pain   . Bone metastases ([place]) 11/16/2014  . Back pain 11/15/2014  . 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 Uncontrolled pain 11/14/2014  . 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 Chest wall pain 03/21/2014  . Abnormal LFTs (liver function tests) 09/12/2013  . Pleural effusion 08/18/2013  . Breast cancer of upper-inner quadrant of right female breast ([place]) 08/18/2013  . 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

## 2015-01-23 ENCOUNTER — Other Ambulatory Visit: Payer: Self-pay | Admitting: *Deleted

## 2015-01-23 ENCOUNTER — Ambulatory Visit
Admission: RE | Admit: 2015-01-23 | Discharge: 2015-01-23 | Disposition: A | Discharge status: Home / Self Care | Payer: 59 | Source: Ambulatory Visit | Attending: Radiation Oncology | Admitting: Radiation Oncology

## 2015-01-23 ENCOUNTER — Encounter: Payer: Self-pay | Admitting: Radiation Oncology

## 2015-01-23 VITALS — BP 119/78 | HR 96 | Temp 98.1°F | Ht 64.0 in

## 2015-01-23 DIAGNOSIS — C7951 Secondary malignant neoplasm of bone: Secondary | ICD-10-CM

## 2015-01-23 NOTE — Progress Notes (Signed)
Follow-up note:   [name] [name] returns today approximately  1 month following completion of low-dose palliative radiotherapy to her left parietal calvarium in the management of her metastatic carcinoma the breast. She is approximately 2 months out from palliative radiotherapy to her lower thoracic spine. Her back pain remains improved , she is not having any discomfort along her left skull. She continues with fulvestrant of the direction of Dr. Jana Hakim.  Unfortunately, her CT scan from September 27 showed a new nodule in the right upper lobe , slight enlargement of left axillary nodal metastasis and thickening along the left crus of the diaphragm indicating overall progression of disease.   Her treated disease along the left T11 region has regressed from 2.6 cm to 1.7 cm. She tells me she will have a  CT PET in December.   Physical examination: alert and oriented. Filed Vitals:   01/23/15 1138  BP: 119/78  Pulse: 96  Temp: 98.1 F (36.7 C)    On inspection left parietal scalp there is alopecia as expected. There has been some regression/flattening of her  skull mass. Neurologic examination: Grossly nonfocal.   Laboratory data: Lab Results  Component Value Date   WBC 5.7 01/16/2015   HGB 13.2 01/16/2015   HCT 39.0 01/16/2015   MCV 90.7 01/16/2015   PLT 410* 01/16/2015    Impression: satisfactory progress.  We will determine whether not she is responding to systemic therapy which she has a repeat PET in December.   Plan: As above.

## 2015-01-23 NOTE — Addendum Note (Signed)
Encounter addended by: [name], MD on: 01/23/2015 12:13 PM<BR>     Documentation filed: Follow-up Section, LOS Section

## 2015-01-23 NOTE — Progress Notes (Signed)
[name] here today for assessment.  Grades pain as a level 6/10 in l chest region with Lymphedema.

## 2015-01-25 ENCOUNTER — Encounter: Payer: 59 | Admitting: Physical Therapy

## 2015-01-26 ENCOUNTER — Ambulatory Visit: Payer: 59 | Admitting: Physical Therapy

## 2015-01-29 ENCOUNTER — Encounter: Payer: 59 | Admitting: Physical Therapy

## 2015-02-02 ENCOUNTER — Ambulatory Visit: Payer: 59

## 2015-02-05 ENCOUNTER — Ambulatory Visit: Payer: 59 | Attending: Oncology | Admitting: Physical Therapy

## 2015-02-05 DIAGNOSIS — I890 Lymphedema, not elsewhere classified: Secondary | ICD-10-CM | POA: Insufficient documentation

## 2015-02-05 DIAGNOSIS — M25551 Pain in right hip: Secondary | ICD-10-CM

## 2015-02-05 DIAGNOSIS — G729 Myopathy, unspecified: Secondary | ICD-10-CM | POA: Insufficient documentation

## 2015-02-05 DIAGNOSIS — M6289 Other specified disorders of muscle: Secondary | ICD-10-CM

## 2015-02-05 NOTE — Therapy (Signed)
[place], [place], [address] Phone: [phone]   Fax:  [phone]  Physical Therapy Treatment  Patient Details  Name: [name] [name] MRN: [id number] Date of Birth: [birth year] Referring Provider: Dr. Humberto Seals  Encounter Date: 02/05/2015      PT End of Session - 02/05/15 1244    Visit Number 71  cancer rehab only   Number of Visits 88   Date for PT Re-Evaluation 02/14/15   PT Start Time 1025   PT Stop Time 1104   PT Time Calculation (min) 39 min   Activity Tolerance Patient tolerated treatment well   Behavior During Therapy [facility] for tasks assessed/performed      Past Medical History  Diagnosis Date  . Seizures ([place]) 2010    Isolated incident.  Marland Kitchen PONV (postoperative nausea and vomiting)   . Peripheral vascular disease ([place]) 02/2010    blood clot related to porta cath  . Breast cancer ([place]) dx'd 2005/2011  . Bone metastases ([place]) dx'd 05/2014  . S/P radiation therapy 07/17/2014 through 08/02/2014     Left mediastinum, left seventh rib 3250 cGy in 13 sessions   . S/P radiation therapy 12/11/2014 through 12/22/2014     Left parietal calvarium 2400 cGy in 8 sessions     Past Surgical History  Procedure Laterality Date  . Breast lumpectomy  2005  . Axillary lymph node dissection  Dec. 2011  . Portacath placement  12/11  . Removal portacath    . Mediastinotomy chamberlain mcneil Left 06/02/2013    Procedure: MEDIASTINOTOMY CHAMBERLAIN MCNEIL;  Surgeon: [name], MD;  Location: [facility];  Service: Thoracic;  Laterality: Left;  LEFT ANTERIOR MEDIASTINOTOMY     There were no vitals filed for this visit.  Visit Diagnosis:  Lymphedema  Pain in joint involving right pelvic region and thigh  Muscle stiffness       Subjective Assessment - 02/05/15 1027    Subjective Right groin pain is back, and tightness in the hamstring muscle.  Got irritated by climbing stairs at her sister's house.  Having some diarrhea issues too.     Currently in Pain? Yes   Pain Score 5   6-7 at right groin   Pain Location Axilla   Pain Orientation Right   Pain Relieving Factors motrin; keeping moving                         [facility] Adult PT Treatment/Exercise - 02/05/15 0001    Manual Therapy   Myofascial Release right axilla with focus at superior scar tightness   Manual Lymphatic Drainage (MLD) In left sidelying, posterior interaxillary anastomosis and right axillo-inguinal anastomosis; in supine, short neck, left axilla and anterior interaxillary anastomosis, right groin and axillo-inguinal anastomosis, area between right breast scars, directing toward pathways.  In right sidelying, left periscapular area toward left groin.   Other Manual Therapy soft tissue work to right posteromedial thigh in right sidelying;                  PT Short Term Goals - 01/18/15 1009    PT SHORT TERM GOAL #1   Title pain with walking decreased >/= 25%   Time 3   Period Weeks   Status Achieved   PT SHORT TERM GOAL #2   Title pelvis stays in correct alignment for 3 weeks   Time 3   Period Weeks  PT Long Term Goals - 01/18/15 1009    PT LONG TERM GOAL #1   Title indpendent with HEP   Time 6   Period Weeks   Status Achieved   PT LONG TERM GOAL #2   Title pain with walking decreased >/= 80%   Time 6   Period Weeks   Status Achieved   PT LONG TERM GOAL #3   Title understand how to perfrom abdominal massage for constipation   Time 6   Period Weeks   Status Achieved           Long Term Clinic Goals - 01/22/15 1223    CC Long Term Goal  #1   Title Reports left upper back and left side pain is controlled at 4/10 or less with therapy at the current frequency.   Baseline Goal is currently  being met and is ongoing.   Status On-going   CC Long Term Goal  #2   Title Pt. will report swelling is adequately managed to enable ADL function at a consistent level.   Baseline This is ongoing; swelling has been worse recently--less amenable to improvement with manual lymph drainage.   Status On-going            Plan - 02/05/15 1245    Clinical Impression Statement Patient has been out of town for 10 days, during which time she had a flareup of her right groin pain and posteromedial thigh tightness.  She benefitted today from some soft tissue work to these areas.  Lymphedema at right breast was also increased.    Pt will benefit from skilled therapeutic intervention in order to improve on the following deficits Increased edema;Pain;Increased fascial restricitons   Rehab Potential Good   Clinical Impairments Affecting Rehab Potential active cancer   PT Frequency 2x / week   PT Duration 12 weeks   PT Treatment/Interventions Manual lymph drainage;Manual techniques   PT Next Visit Plan Patient will be working with pelvic pain therapist again over the next couple of weeks to get that under control.  She will use her Flexitouch pump at home and decrease therapy visits for lymphedema during this time.  Continue with manual lymph drainage and soft tissue work.   Consulted and Agree with Plan of Care Patient        Problem List Patient Active Problem List   Diagnosis Date Noted  . Nausea with vomiting 11/18/2014  . Constipation 11/18/2014  . Left-sided thoracic back pain   . Bone metastases ([place]) 11/16/2014  . Back pain 11/15/2014  . 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 Uncontrolled pain 11/14/2014  . 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 Chest wall pain 03/21/2014  . Abnormal LFTs (liver function tests) 09/12/2013  . Pleural effusion 08/18/2013  . Breast cancer of upper-inner quadrant of right female breast ([place]) 08/18/2013  .  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

## 2015-02-06 ENCOUNTER — Other Ambulatory Visit: Payer: Self-pay

## 2015-02-06 ENCOUNTER — Encounter: Payer: 59 | Admitting: Physical Therapy

## 2015-02-06 DIAGNOSIS — C7951 Secondary malignant neoplasm of bone: Secondary | ICD-10-CM

## 2015-02-06 DIAGNOSIS — C801 Malignant (primary) neoplasm, unspecified: Secondary | ICD-10-CM

## 2015-02-06 DIAGNOSIS — C771 Secondary and unspecified malignant neoplasm of intrathoracic lymph nodes: Secondary | ICD-10-CM

## 2015-02-06 DIAGNOSIS — C50211 Malignant neoplasm of upper-inner quadrant of right female breast: Secondary | ICD-10-CM

## 2015-02-06 NOTE — Progress Notes (Signed)
Placed orders for PT per [facility] for patient's groin pain.

## 2015-02-08 ENCOUNTER — Other Ambulatory Visit: Payer: Self-pay | Admitting: Oncology

## 2015-02-08 ENCOUNTER — Encounter: Payer: Self-pay | Admitting: Physical Therapy

## 2015-02-08 ENCOUNTER — Ambulatory Visit: Payer: 59 | Attending: Oncology | Admitting: Physical Therapy

## 2015-02-08 DIAGNOSIS — G729 Myopathy, unspecified: Secondary | ICD-10-CM | POA: Diagnosis present

## 2015-02-08 DIAGNOSIS — C50019 Malignant neoplasm of nipple and areola, unspecified female breast: Secondary | ICD-10-CM

## 2015-02-08 DIAGNOSIS — M25551 Pain in right hip: Secondary | ICD-10-CM | POA: Diagnosis not present

## 2015-02-08 DIAGNOSIS — R1031 Right lower quadrant pain: Secondary | ICD-10-CM | POA: Diagnosis present

## 2015-02-08 NOTE — Therapy (Signed)
[facility] [address] [place], [place], [address] Phone: [phone]   Fax:  [phone]  Physical Therapy Evaluation  Patient Details  Name: [name] [name] MRN: [id number] Date of Birth: March 11, 1961 Referring Provider: Dr. Lurline Del  Encounter Date: 02/08/2015      PT End of Session - 02/08/15 1114    Visit Number 1  pelvis   Date for PT Re-Evaluation 03/22/15   PT Start Time 1100   PT Stop Time 1145   PT Time Calculation (min) 45 min   Activity Tolerance Patient tolerated treatment well   Behavior During Therapy [facility] for tasks assessed/performed      Past Medical History  Diagnosis Date  . Seizures ([place]) 2010    Isolated incident.  Marland Kitchen PONV (postoperative nausea and vomiting)   . Peripheral vascular disease ([place]) 02/2010    blood clot related to porta cath  . Breast cancer ([place]) dx'd 2005/2011  . Bone metastases ([place]) dx'd 05/2014  . S/P radiation therapy 07/17/2014 through 08/02/2014     Left mediastinum, left seventh rib 3250 cGy in 13 sessions   . S/P radiation therapy 12/11/2014 through 12/22/2014     Left parietal calvarium 2400 cGy in 8 sessions     Past Surgical History  Procedure Laterality Date  . Breast lumpectomy  2005  . Axillary lymph node dissection  Dec. 2011  . Portacath placement  12/11  . Removal portacath    . Mediastinotomy chamberlain mcneil Left 06/02/2013    Procedure: MEDIASTINOTOMY CHAMBERLAIN MCNEIL;  Surgeon: [name], MD;  Location: [facility];  Service: Thoracic;  Laterality: Left;  LEFT ANTERIOR MEDIASTINOTOMY     There were no vitals filed for this visit.  Visit Diagnosis:  Pain in joint involving right pelvic region and thigh - Plan: PT plan of care cert/re-cert  Groin pain, right - Plan: PT plan of  care cert/re-cert      Subjective Assessment - 02/08/15 1108    Subjective I went on my trip and stood to ice cookies and had increase in pain.  I also stood to prepare foods.  I had to go up and down stairs often that increased my pain.  Pain started 01/28/2015.    How long can you walk comfortably? make patient limp   Patient Stated Goals walking with minimal pain   Currently in Pain? Yes   Pain Score 6    Pain Location Hip   Pain Orientation Right   Pain Descriptors / Indicators Tightness   Pain Type Acute pain   Pain Radiating Towards down to right knee   Pain Frequency Constant   Aggravating Factors  walking, stairs; sit for a long time then stand up to walk   Pain Relieving Factors motrin, keep moving   Multiple Pain Sites No            [facility] PT Assessment - 02/08/15 0001    Assessment   Medical Diagnosis perirectal rectal/perineal pain   Referring Provider Dr. Sarajane Jews Magrinat   Onset Date/Surgical Date 01/28/15   Prior Therapy None   Precautions   Precautions Other (comment)   Precaution Comments No ultrasound   Balance Screen   Has the patient fallen in the past 6 months No   Has the patient had a decrease in activity level because of a fear of falling?  No   Is the patient reluctant to leave their home because of a fear of falling?  No  Prior Function   Level of Independence Independent   Cognition   Overall Cognitive Status Within Functional Limits for tasks assessed   Palpation   SI assessment  right ilium is rotated anteriorly   Palpation comment pain in right groin, tight in right pelvic floor and buttocks   Ambulation/Gait   Gait Pattern --  limp on right   Stairs Yes   Stair Management Technique Step to pattern;One rail Left                 Pelvic Floor Special Questions - 02/08/15 0001    Pelvic Floor Internal Exam Patient approves physical therapist to perform pelvic floor muscle assessment   Exam Type Vaginal   Palpation tenderness  located on righ tobturator internist and levator ani          [facility] Adult PT Treatment/Exercise - 02/08/15 0001    Manual Therapy   Internal Pelvic Floor to right obturator internist and puborectalis   Muscle Energy Technique corrected anterior right ilium                  PT Short Term Goals - 02/08/15 1126    PT SHORT TERM GOAL #1   Title pain with walking decreased >/= 25%   Time 3   Period Weeks   Status New   PT SHORT TERM GOAL #2   Title pelvis stays in correct alignment for 3 weeks   Time 3   Period Weeks   Status New           PT Long Term Goals - 02/08/15 1127    PT LONG TERM GOAL #1   Title indpendent with HEP   Time 6   Period Weeks   Status New   PT LONG TERM GOAL #2   Title pain with walking decreased >/= 80%   Time 6   Period Weeks   Status New   PT LONG TERM GOAL #3   Title go up and down steps with a step over step pattern   Time 6   Period Weeks   Status New           Long Term Clinic Goals - 01/22/15 1223    CC Long Term Goal  #1   Title Reports left upper back and left side pain is controlled at 4/10 or less with therapy at the current frequency.   Baseline Goal is currently being met and is ongoing.   Status On-going   CC Long Term Goal  #2   Title Pt. will report swelling is adequately managed to enable ADL function at a consistent level.   Baseline This is ongoing; swelling has been worse recently--less amenable to improvement with manual lymph drainage.   Status On-going            Plan - 02/08/15 1134    Clinical Impression Statement Patient is a 54 year old female with right groin pain that began on 01/28/2015.  Patient had the pain before and therapy and had good results with therapy.  Right ilium is anteriorly rotated.  Palpable tenderness located in right tensor fascia latta, right obturator internist, right puborectalis, and right hip adductors.  Patient report her pain is constant at level  6/10 in right   groin.  Pain is worse wiht steps, walking, and getting out of a chair.  Patient reports the pain radiates into the right knee. Patient goes up and down steps with a step to step pattern and  walks with limp on right.  Patient will benefit from physical therapy to reduce pain and have her ambulate without pain or limp.    Pt will benefit from skilled therapeutic intervention in order to improve on the following deficits Abnormal gait;Difficulty walking;Pain;Increased muscle spasms;Decreased activity tolerance;Decreased endurance   Rehab Potential Excellent   Clinical Impairments Affecting Rehab Potential active cancer   PT Frequency 1x / week   PT Duration 6 weeks   PT Treatment/Interventions Therapeutic exercise;Therapeutic activities;Neuromuscular re-education;Patient/family education;Manual techniques   PT Next Visit Plan internal soft tissue work to pelvic floor, check pelvic alignment   PT Home Exercise Plan flexibility exercises, self correction of pelvic floor   Consulted and Agree with Plan of Care Patient   PT Plan Continue manual lymph drainage, soft tissue work.         Problem List Patient Active Problem List   Diagnosis Date Noted  . Nausea with vomiting 11/18/2014  . Constipation 11/18/2014  . Left-sided thoracic back pain   . Bone metastases ([place]) 11/16/2014  . Back pain 11/15/2014  . 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 Uncontrolled pain 11/14/2014  . 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 Chest wall pain 03/21/2014  . Abnormal LFTs (liver function tests) 09/12/2013  . Pleural effusion 08/18/2013  . Breast cancer of upper-inner quadrant of right female breast ([place]) 08/18/2013  . 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 [place], [place], [address] Phone:  [phone]   Fax:  [phone]  Name: EARNESTENE ANGELLO MRN: [id number] Date of Birth: 11/24/1960

## 2015-02-09 ENCOUNTER — Ambulatory Visit: Payer: 59 | Admitting: Physical Therapy

## 2015-02-09 DIAGNOSIS — I890 Lymphedema, not elsewhere classified: Secondary | ICD-10-CM | POA: Diagnosis not present

## 2015-02-09 DIAGNOSIS — M25551 Pain in right hip: Secondary | ICD-10-CM

## 2015-02-09 DIAGNOSIS — M6289 Other specified disorders of muscle: Secondary | ICD-10-CM

## 2015-02-09 NOTE — Therapy (Signed)
[place], [place], [address] Phone: [phone]   Fax:  [phone]  Physical Therapy Treatment  Patient Details  Name: [name] [name] MRN: [id number] Date of Birth: 30-Nov-1960 Referring Provider: Dr. Lurline Del  Encounter Date: 02/09/2015      PT End of Session - 02/09/15 1221    Visit Number 40  cancer rehab only   Number of Visits 88   Date for PT Re-Evaluation 02/14/15   PT Start Time 1114   PT Stop Time 1202   PT Time Calculation (min) 48 min   Activity Tolerance Patient tolerated treatment well   Behavior During Therapy [facility] for tasks assessed/performed      Past Medical History  Diagnosis Date  . Seizures ([place]) 2010    Isolated incident.  Marland Kitchen PONV (postoperative nausea and vomiting)   . Peripheral vascular disease ([place]) 02/2010    blood clot related to porta cath  . Breast cancer ([place]) dx'd 2005/2011  . Bone metastases ([place]) dx'd 05/2014  . S/P radiation therapy 07/17/2014 through 08/02/2014     Left mediastinum, left seventh rib 3250 cGy in 13 sessions   . S/P radiation therapy 12/11/2014 through 12/22/2014     Left parietal calvarium 2400 cGy in 8 sessions     Past Surgical History  Procedure Laterality Date  . Breast lumpectomy  2005  . Axillary lymph node dissection  Dec. 2011  . Portacath placement  12/11  . Removal portacath    . Mediastinotomy chamberlain mcneil Left 06/02/2013    Procedure: MEDIASTINOTOMY CHAMBERLAIN MCNEIL;  Surgeon: [name], MD;  Location: [place];  Service: Thoracic;  Laterality: Left;  LEFT ANTERIOR MEDIASTINOTOMY     There were no vitals filed for this visit.  Visit Diagnosis:  Pain in joint involving right pelvic region and thigh  Lymphedema  Muscle stiffness       Subjective Assessment - 02/09/15 1115    Subjective "I'm really tired now all the time.  It kind of worries me.  I saw [name] and she corrected my pelvis and worked inside, but it's still bothering me."   Currently in Pain? Yes   Pain Score 5   6 on leg (thigh to knee)   Pain Location Axilla   Pain Orientation Right                         [facility] Adult PT Treatment/Exercise - 02/09/15 0001    Manual Therapy   Myofascial Release right axilla with focus at superior scar tightness   Manual Lymphatic Drainage (MLD) In left sidelying, posterior interaxillary anastomosis and right axillo-inguinal anastomosis; in supine, short neck, left axilla and anterior interaxillary anastomosis, right groin and axillo-inguinal anastomosis, area between right breast scars, directing toward pathways, and right upper arm.  In right sidelying, left periscapular area toward left groin.   Other Manual Therapy soft tissue work to right posteromedial thigh in right sidelying;                  PT Short Term Goals - 02/08/15 1126    PT SHORT TERM GOAL #1   Title pain with walking decreased >/= 25%   Time 3   Period Weeks   Status New   PT SHORT TERM GOAL #2   Title pelvis stays in correct alignment for 3 weeks   Time 3   Period Weeks   Status New  PT Long Term Goals - 02/08/15 1127    PT LONG TERM GOAL #1   Title indpendent with HEP   Time 6   Period Weeks   Status New   PT LONG TERM GOAL #2   Title pain with walking decreased >/= 80%   Time 6   Period Weeks   Status New   PT LONG TERM GOAL #3   Title go up and down steps with a step over step pattern   Time 6   Period Weeks   Status New           Long Term Clinic Goals - 02/09/15 1227    CC Long Term Goal  #1   Title Reports left upper back and left side pain is controlled at 4/10 or less with therapy at the current frequency.   Status On-going   CC Long Term Goal  #2   Title Pt. will report  swelling is adequately managed to enable ADL function at a consistent level.   Status On-going            Plan - 02/09/15 1223    Clinical Impression Statement Patient came in looking tired and reporting feeling tired today; she also was limping.  Swelling at right lateral breast area appears increased further, and the more superior of her two lateral breast incisions appeared more bound down, or perhaps just more surrounded by fluid today.  She has been using her Flexitouch pump, but it is not apparent when she come in to therapy that this is reducing swelling.  She does continue to report benefit from manual therapy techniques.   Pt will benefit from skilled therapeutic intervention in order to improve on the following deficits Pain;Increased edema;Increased fascial restricitons;Increased muscle spasms   Rehab Potential Good   Clinical Impairments Affecting Rehab Potential active cancer   PT Frequency 2x / week   PT Duration 12 weeks   PT Treatment/Interventions Manual techniques;Manual lymph drainage   PT Next Visit Plan Reassess, renew; continue manual lymph drainage, myofascial release, and soft tissue work   Consulted and Agree with Plan of Care Patient        Problem List Patient Active Problem List   Diagnosis Date Noted  . Nausea with vomiting 11/18/2014  . Constipation 11/18/2014  . Left-sided thoracic back pain   . Bone metastases ([place]) 11/16/2014  . Back pain 11/15/2014  . 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 Uncontrolled pain 11/14/2014  . 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 Chest wall pain 03/21/2014  . Abnormal LFTs (liver function tests) 09/12/2013  . Pleural effusion 08/18/2013  . Breast cancer of upper-inner quadrant of right female breast ([place]) 08/18/2013  . 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

## 2015-02-12 ENCOUNTER — Other Ambulatory Visit: Payer: Self-pay

## 2015-02-12 ENCOUNTER — Ambulatory Visit: Payer: 59 | Admitting: Physical Therapy

## 2015-02-12 DIAGNOSIS — C50211 Malignant neoplasm of upper-inner quadrant of right female breast: Secondary | ICD-10-CM

## 2015-02-13 ENCOUNTER — Encounter: Payer: Self-pay | Admitting: Physical Therapy

## 2015-02-13 ENCOUNTER — Other Ambulatory Visit (HOSPITAL_BASED_OUTPATIENT_CLINIC_OR_DEPARTMENT_OTHER): Payer: 59

## 2015-02-13 ENCOUNTER — Other Ambulatory Visit: Payer: 59

## 2015-02-13 ENCOUNTER — Ambulatory Visit: Payer: 59 | Admitting: Physical Therapy

## 2015-02-13 DIAGNOSIS — C50211 Malignant neoplasm of upper-inner quadrant of right female breast: Secondary | ICD-10-CM

## 2015-02-13 DIAGNOSIS — M25551 Pain in right hip: Secondary | ICD-10-CM

## 2015-02-13 DIAGNOSIS — R1031 Right lower quadrant pain: Secondary | ICD-10-CM

## 2015-02-13 LAB — CBC WITH DIFFERENTIAL/PLATELET
BASO%: 0.5 % (ref 0.0–2.0)
Basophils Absolute: 0 10*3/uL (ref 0.0–0.1)
EOS%: 1.7 % (ref 0.0–7.0)
Eosinophils Absolute: 0.1 10*3/uL (ref 0.0–0.5)
HEMATOCRIT: 39.3 % (ref 34.8–46.6)
HEMOGLOBIN: 13.1 g/dL (ref 11.6–15.9)
LYMPH#: 0.7 10*3/uL — AB (ref 0.9–3.3)
LYMPH%: 11 % — ABNORMAL LOW (ref 14.0–49.7)
MCH: 31 pg (ref 25.1–34.0)
MCHC: 33.3 g/dL (ref 31.5–36.0)
MCV: 93.1 fL (ref 79.5–101.0)
MONO#: 0.4 10*3/uL (ref 0.1–0.9)
MONO%: 6.7 % (ref 0.0–14.0)
NEUT%: 80.1 % — ABNORMAL HIGH (ref 38.4–76.8)
NEUTROS ABS: 5.3 10*3/uL (ref 1.5–6.5)
PLATELETS: 337 10*3/uL (ref 145–400)
RBC: 4.22 10*6/uL (ref 3.70–5.45)
RDW: 14.6 % — ABNORMAL HIGH (ref 11.2–14.5)
WBC: 6.6 10*3/uL (ref 3.9–10.3)

## 2015-02-13 LAB — COMPREHENSIVE METABOLIC PANEL (CC13)
ALBUMIN: 3.7 g/dL (ref 3.5–5.0)
ALT: 61 U/L — AB (ref 0–55)
ANION GAP: 9 meq/L (ref 3–11)
AST: 37 U/L — ABNORMAL HIGH (ref 5–34)
Alkaline Phosphatase: 125 U/L (ref 40–150)
BILIRUBIN TOTAL: 0.39 mg/dL (ref 0.20–1.20)
BUN: 10.4 mg/dL (ref 7.0–26.0)
CALCIUM: 9.8 mg/dL (ref 8.4–10.4)
CO2: 25 meq/L (ref 22–29)
CREATININE: 0.7 mg/dL (ref 0.6–1.1)
Chloride: 103 mEq/L (ref 98–109)
EGFR: >90 mL/min/{1.73_m2} (ref >90)
Glucose: 99 mg/dl (ref 70–140)
Potassium: 4.4 mEq/L (ref 3.5–5.1)
Sodium: 137 mEq/L (ref 136–145)
TOTAL PROTEIN: 7.2 g/dL (ref 6.4–8.3)

## 2015-02-13 NOTE — Therapy (Signed)
[facility] [address] [place], [place], [address] Phone: [phone]   Fax:  [phone]  Physical Therapy Treatment  Patient Details  Name: [name] [name] MRN: [id number] Date of Birth: April 07, 1960 Referring Provider: Dr. Lurline Del  Encounter Date: 02/13/2015      PT End of Session - 02/13/15 1237    Visit Number 52  Pelvic   Number of Visits 89   Date for PT Re-Evaluation 02/14/15   PT Start Time 1230   PT Stop Time 1310   PT Time Calculation (min) 40 min   Activity Tolerance Patient tolerated treatment well   Behavior During Therapy [facility] for tasks assessed/performed      Past Medical History  Diagnosis Date  . Seizures ([place]) 2010    Isolated incident.  Marland Kitchen PONV (postoperative nausea and vomiting)   . Peripheral vascular disease ([place]) 02/2010    blood clot related to porta cath  . Breast cancer ([place]) dx'd 2005/2011  . Bone metastases ([place]) dx'd 05/2014  . S/P radiation therapy 07/17/2014 through 08/02/2014     Left mediastinum, left seventh rib 3250 cGy in 13 sessions   . S/P radiation therapy 12/11/2014 through 12/22/2014     Left parietal calvarium 2400 cGy in 8 sessions     Past Surgical History  Procedure Laterality Date  . Breast lumpectomy  2005  . Axillary lymph node dissection  Dec. 2011  . Portacath placement  12/11  . Removal portacath    . Mediastinotomy chamberlain mcneil Left 06/02/2013    Procedure: MEDIASTINOTOMY CHAMBERLAIN MCNEIL;  Surgeon: [name], MD;  Location: [place];  Service: Thoracic;  Laterality: Left;  LEFT ANTERIOR MEDIASTINOTOMY     There were no vitals filed for this visit.  Visit Diagnosis:  Pain in joint involving right pelvic region and thigh  Groin pain, right      Subjective Assessment -  02/13/15 1235    Subjective the internal helped some. Patient reports right knee pain decreased by 30%.    How long can you walk comfortably? make patient limp   Patient Stated Goals walking with minimal pain   Currently in Pain? Yes   Pain Score 6    Pain Location Hip  groin   Pain Orientation Right;Anterior;Posterior   Pain Descriptors / Indicators Tightness   Pain Type Acute pain   Pain Radiating Towards down to right knee   Pain Onset More than a month ago   Pain Frequency Constant   Aggravating Factors  walking, stairs, sitting then get up to walk   Pain Relieving Factors keep moving, motrin   Effect of Pain on Daily Activities difficulty walking   Multiple Pain Sites No            [facility] PT Assessment - 02/13/15 0001    Palpation   SI assessment  pelvis in correct alignment                  Pelvic Floor Special Questions - 02/13/15 0001    Pelvic Floor Internal Exam Patient approves physical therapist to perform pelvic floor muscle assessment   Exam Type Vaginal           [facility] Adult PT Treatment/Exercise - 02/13/15 0001    Manual Therapy   Internal Pelvic Floor right obturator internist, right puborectalis, righ tiliococcygeus   Other Manual Therapy right hip adductors  PT Education - 02/13/15 1307    Education provided No          PT Short Term Goals - 02/13/15 1313    PT SHORT TERM GOAL #1   Title pain with walking decreased >/= 25%   Time 3   Period Weeks   Status On-going   PT SHORT TERM GOAL #2   Title pelvis stays in correct alignment for 3 weeks   Time 3   Period Weeks   Status On-going           PT Long Term Goals - 02/08/15 1127    PT LONG TERM GOAL #1   Title indpendent with HEP   Time 6   Period Weeks   Status New   PT LONG TERM GOAL #2   Title pain with walking decreased >/= 80%   Time 6   Period Weeks   Status New   PT LONG TERM GOAL #3   Title go up and down steps with a step over step  pattern   Time 6   Period Weeks   Status New           Long Term Clinic Goals - 02/09/15 1227    CC Long Term Goal  #1   Title Reports left upper back and left side pain is controlled at 4/10 or less with therapy at the current frequency.   Status On-going   CC Long Term Goal  #2   Title Pt. will report swelling is adequately managed to enable ADL function at a consistent level.   Status On-going            Plan - 02/13/15 1307    Clinical Impression Statement Patient is a 54 year old female with right groin pain.  Patient pelvis in correct alignment.  After therapy pain decresaed by 50%.  Patient has trigger points in righ tpelvic floor muscles.  Patient has not met goals due to just starting therapy.  Patient will benefit form physical therapy  to reduce pain.    Pt will benefit from skilled therapeutic intervention in order to improve on the following deficits Pain;Decreased strength;Decreased endurance;Increased muscle spasms   Rehab Potential Good   Clinical Impairments Affecting Rehab Potential active cancer   PT Frequency 2x / week   PT Duration 6 weeks   PT Treatment/Interventions Therapeutic exercise;Therapeutic activities;Neuromuscular re-education;Patient/family education;Manual techniques   PT Next Visit Plan core stab.    PT Home Exercise Plan core stability   PT Plan Continue manual lymph drainage, soft tissue work.        Problem List Patient Active Problem List   Diagnosis Date Noted  . Nausea with vomiting 11/18/2014  . Constipation 11/18/2014  . Left-sided thoracic back pain   . Bone metastases ([place]) 11/16/2014  . Back pain 11/15/2014  . 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 Uncontrolled pain 11/14/2014  . 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 Chest wall pain 03/21/2014  . Abnormal LFTs (liver function tests) 09/12/2013  . Pleural effusion 08/18/2013  . Breast cancer of upper-inner quadrant of right  female breast ([place]) 08/18/2013  . 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 [place], [place], [address] Phone: [phone]   Fax:  [phone]  Name: [name] [name] MRN: [id number] Date of Birth: Apr 23, 1960

## 2015-02-14 ENCOUNTER — Ambulatory Visit (HOSPITAL_BASED_OUTPATIENT_CLINIC_OR_DEPARTMENT_OTHER): Payer: 59 | Admitting: Oncology

## 2015-02-14 ENCOUNTER — Telehealth: Payer: Self-pay | Admitting: Oncology

## 2015-02-14 ENCOUNTER — Ambulatory Visit (HOSPITAL_BASED_OUTPATIENT_CLINIC_OR_DEPARTMENT_OTHER): Payer: 59

## 2015-02-14 DIAGNOSIS — C50211 Malignant neoplasm of upper-inner quadrant of right female breast: Secondary | ICD-10-CM

## 2015-02-14 DIAGNOSIS — C771 Secondary and unspecified malignant neoplasm of intrathoracic lymph nodes: Secondary | ICD-10-CM

## 2015-02-14 DIAGNOSIS — I890 Lymphedema, not elsewhere classified: Secondary | ICD-10-CM | POA: Diagnosis not present

## 2015-02-14 DIAGNOSIS — J90 Pleural effusion, not elsewhere classified: Secondary | ICD-10-CM

## 2015-02-14 DIAGNOSIS — Z86718 Personal history of other venous thrombosis and embolism: Secondary | ICD-10-CM

## 2015-02-14 DIAGNOSIS — Z5111 Encounter for antineoplastic chemotherapy: Secondary | ICD-10-CM | POA: Diagnosis not present

## 2015-02-14 DIAGNOSIS — C50019 Malignant neoplasm of nipple and areola, unspecified female breast: Secondary | ICD-10-CM

## 2015-02-14 DIAGNOSIS — R0789 Other chest pain: Secondary | ICD-10-CM | POA: Diagnosis not present

## 2015-02-14 MED ORDER — FULVESTRANT 250 MG/5ML IM SOLN
500.0000 mg | Freq: Once | INTRAMUSCULAR | Status: AC
  Administered 2015-02-14: 500 mg via INTRAMUSCULAR
  Filled 2015-02-14: qty 10

## 2015-02-14 NOTE — Progress Notes (Signed)
[employer]  [phone] [phone]     ID: [name] OB: May 08, 1960  MR#: [id number]  GXQ#:[id number]  PCP: Pcp Not In System GYN:  [name] SU:  OTHER MD: [name], [name], [name], [name], [name]  CHIEF COMPLAINT: Stage IV breast cancer  CURRENT TREATMENT: Fulvestrant  BREAST CANCER HISTORY: From doctor [name]'s intake note 03/20/2004:  "The patient is a very pleasant 54 year old female, without significant past medical history.  Her family history is significant for a sister who at age 11 was diagnosed with invasive ductal carcinoma.  She is a breast cancer survivor at age 85 now.  The patient states that she has never really had a screening mammogram until October 2005, when she felt that it was time for her to start having mammograms done on a yearly basis.  Therefore, on 01/26/04, she underwent a screening mammogram and an abnormality was detected in the upper outer right breast.  She, therefore, underwent spot compression views of both the right and the left breast.  The left breast revealed a well-defined mass in the upper outer left quadrant, present at the 2 o'clock position, measuring 1.8 cm, 6 cm from the nipple.  This, by ultrasound, was felt to be a simple cyst measuring 1.8 cm.  On the right breast, a spiculated mass was noted in the upper outer right quadrant.  The ultrasound revealed a shadowing irregular solid mass at the 10:30 position, 9 cm from the nipple, measuring 1.2 cm in greatest dimension, correlating with the spiculated mass seen on the mammogram.  The right axilla was negative ultrasonically.  Because of this, the patient underwent a needle biopsy of the right breast and the biopsy was positive invasive mammary carcinoma that showed features consistent with a high-grade invasive ductal carcinoma associated with desmoplastic stroma.  No in situ component was seen  and no definite lymphovascular invasion was identified.  On the core biopsy, the tumor measured about 0.8 cm.  Because of this, she was seen by Dr. [name] and the patient was taken to the [facility] on March 15, 2004.  She underwent a right breast lumpectomy with sentinel node biopsy.  The final pathology revealed an invasive ductal carcinoma, measuring 1.7 cm, grade 2 of 3.  Margins were free of tumor.  Atypical lobular hyperplasia was noted.  One sentinel node was removed which was negative for metastatic disease.  The tumor was staged at T1c, N0 MX.  It was estrogen receptor positive, progesterone receptor positive.  HER-2/neu was 2+.  FISH was negative.  All margins were free of tumor.  She is now seen in Medical Oncology for further evaluation and management of this newly diagnosed T1c, node negative, stage I, invasive ductal carcinoma of the right breast."  Her subsequent history is as detailed below  INTERVAL HISTORY: Lacora returns today for follow-up of her estrogen receptor positive breast cancer accompanied by her husband, [name]. She continues on fulvestrant, which she will receive again today. The shots of course are painful. In addition there has been significant hair thinning. Some of this is going to be due to the prior radiation she received for her scalp/skull lesion, but some of it is due to the fulvestrant. She is also more fatigue than before. She does not let herself get into bed. If she is very tired she will sit down for 15 minutes and then get going again. Her right leg discomfort got worse after  her trip to visit her family. This was 7 hours one way and 5 hours the other way driving although they did take many breaks. She thinks he was going up and down stairs that really dated. She is getting physical therapy for this as well as for the lymphedema problem. She is using the flex he soon again and that is helping some although there is more in duration she tells me in the upper  right chest wall. She is having insomnia problems largely because of hot flashes. She uses melatonin and rarely a little bit of Xanax. She gets cramps rarely. She is keeping herself well hydrated. Aside from these issues a detailed review of systems today was stable.   REVIEW OF SYSTEMS: A detailed review of systems today was otherwise stable  PAST MEDICAL HISTORY: Past Medical History  Diagnosis Date  . Seizures ([place]) 2010    Isolated incident.  Marland Kitchen PONV (postoperative nausea and vomiting)   . Peripheral vascular disease ([place]) 02/2010    blood clot related to porta cath  . Breast cancer ([place]) dx'd 2005/2011  . Bone metastases ([place]) dx'd 05/2014  . S/P radiation therapy 07/17/2014 through 08/02/2014     Left mediastinum, left seventh rib 3250 cGy in 13 sessions   . S/P radiation therapy 12/11/2014 through 12/22/2014     Left parietal calvarium 2400 cGy in 8 sessions     PAST SURGICAL HISTORY: Past Surgical History  Procedure Laterality Date  . Breast lumpectomy  2005  . Axillary lymph node dissection  Dec. 2011  . Portacath placement  12/11  . Removal portacath    . Mediastinotomy chamberlain mcneil Left 06/02/2013    Procedure: MEDIASTINOTOMY CHAMBERLAIN MCNEIL;  Surgeon: [name], MD;  Location: [facility] OR;  Service: Thoracic;  Laterality: Left;  LEFT ANTERIOR MEDIASTINOTOMY     FAMILY HISTORY Family History  Problem Relation Age of Onset  . COPD Mother   . Breast cancer Sister 19   The patient's father is living, 33 years old as of may 2015. He lives in [place]. The patient's mother died from complications of COPD at the age of 55. These has 2 brothers, one sister. Her sister developed breast cancer at the age of 58. She is doing well. The patient herself underwent genetic testing at [facility] in 2011 and was found to be BRCA  negative  GYNECOLOGIC HISTORY:  Menarche age 69, she is GX P0. She stopped having periods with her initial chemotherapy in 2006.  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 Her husband [name] is a Medical illustrator in [place]. He has a child from a prior marriage. At home they have 2 rescue dogs, Hobo and Kramer. The patient is religious but not a church attender    ADVANCED DIRECTIVES: In place; at the 08/04/2014 visit in particular the patient was very clear, with her husband present, that she would not want any kind of feeding tubes or "other tubes" if her condition deteriorated.   HEALTH MAINTENANCE: Social History  Substance Use Topics  . Smoking status: Never Smoker   . Smokeless tobacco: Never Used  . Alcohol Use: No     Colonoscopy:  PAP:  Bone density: March 2015; mild osteopenia  Lipid panel:  Allergies  Allergen Reactions  . Decadron [Dexamethasone] Other (See Comments)    Patient does not tolerate steroids.   . Dilaudid [Hydromorphone] Nausea And Vomiting  .  Fluconazole Swelling    Liver toxicity  . Hydromorphone Hcl Nausea And Vomiting  . Morphine And Related Nausea And Vomiting  . Protonix [Pantoprazole Sodium] Other (See Comments)    Patient reports it caused thrush.  . Tegaderm Ag Mesh [Silver]     Current Outpatient Prescriptions  Medication Sig Dispense Refill  . ALPRAZolam (XANAX) 0.5 MG tablet Take 1 tablet (0.5 mg total) by mouth 2 (two) times daily as needed for anxiety. (Patient taking differently: Take 0.125 mg by mouth 2 (two) times daily as needed for anxiety. ) 30 tablet 0  . B Complex-C (B-COMPLEX WITH VITAMIN C) tablet Take 1 tablet by mouth daily.    . cholecalciferol 2000 UNITS tablet Take 1 tablet (2,000 Units total) by mouth daily.    . folic acid (FOLVITE) 1 MG tablet Take 1 tablet (1 mg total) by mouth daily.    Marland Kitchen ibuprofen (ADVIL,MOTRIN) 200 MG tablet Take 400 mg  by mouth every 4 (four) hours as needed for moderate pain.     . Melatonin 3 MG TABS Take 3 mg by mouth at bedtime.     No current facility-administered medications for this visit.    OBJECTIVE: Middle-aged white woman who appears stated age There were no vitals filed for this visit.   There is no weight on file to calculate BMI.   There were no vitals filed for this visit.    Patient refused vitals today (02/14/2015)    ECOG FS:1 - Symptomatic but completely ambulatory  Sclerae unicteric, EOMs intact Oropharynx clear, dentition in good repair No cervical or supraclavicular adenopathy Lungs no rales or rhonchi, or dullness at left base as previously noted Heart regular rate and rhythm Abd soft, nontender, positive bowel sounds MSK no focal spinal tenderness, no upper extremity lymphedema Neuro: nonfocal, well oriented, appropriate affect Breasts: Deferred   LAB RESULTS:   CMP     Component Value Date/Time   NA 137 02/13/2015 1009   NA 140 12/14/2014 0800   K 4.4 02/13/2015 1009   K 3.6 12/14/2014 0800   CL 103 12/14/2014 0800   CL 105 05/06/2012 1333   CO2 25 02/13/2015 1009   CO2 28 12/14/2014 0800   GLUCOSE 99 02/13/2015 1009   GLUCOSE 100* 12/14/2014 0800   GLUCOSE 124* 05/06/2012 1333   BUN 10.4 02/13/2015 1009   BUN 6 12/14/2014 0800   CREATININE 0.7 02/13/2015 1009   CREATININE 0.64 12/14/2014 0800   CALCIUM 9.8 02/13/2015 1009   CALCIUM 9.6 12/14/2014 0800   PROT 7.2 02/13/2015 1009   PROT 5.6* 11/20/2014 0433   ALBUMIN 3.7 02/13/2015 1009   ALBUMIN 2.6* 11/20/2014 0433   AST 37* 02/13/2015 1009   AST 28 11/20/2014 0433   ALT 61* 02/13/2015 1009   ALT 62* 11/20/2014 0433   ALKPHOS 125 02/13/2015 1009   ALKPHOS 105 11/20/2014 0433   BILITOT 0.39 02/13/2015 1009   BILITOT 0.7 11/20/2014 0433   GFRNONAA >60 12/14/2014 0800   GFRAA >60 12/14/2014 0800    No results found for: SPEP  Lab Results  Component Value Date   WBC 6.6 02/13/2015    NEUTROABS 5.3 02/13/2015   HGB 13.1 02/13/2015   HCT 39.3 02/13/2015   MCV 93.1 02/13/2015   PLT 337 02/13/2015      Chemistry      Component Value Date/Time   NA 137 02/13/2015 1009   NA 140 12/14/2014 0800   K 4.4 02/13/2015 1009   K 3.6 12/14/2014 0800  CL 103 12/14/2014 0800   CL 105 05/06/2012 1333   CO2 25 02/13/2015 1009   CO2 28 12/14/2014 0800   BUN 10.4 02/13/2015 1009   BUN 6 12/14/2014 0800   CREATININE 0.7 02/13/2015 1009   CREATININE 0.64 12/14/2014 0800      Component Value Date/Time   CALCIUM 9.8 02/13/2015 1009   CALCIUM 9.6 12/14/2014 0800   ALKPHOS 125 02/13/2015 1009   ALKPHOS 105 11/20/2014 0433   AST 37* 02/13/2015 1009   AST 28 11/20/2014 0433   ALT 61* 02/13/2015 1009   ALT 62* 11/20/2014 0433   BILITOT 0.39 02/13/2015 1009   BILITOT 0.7 11/20/2014 0433       Lab Results  Component Value Date   LABCA2 25 11/02/2007    No components found for: [id number]  No results for input(s): INR in the last 168 hours.  Urinalysis    Component Value Date/Time   COLORURINE YELLOW 11/17/2014 0143   APPEARANCEUR CLOUDY* 11/17/2014 0143   LABSPEC 1.010 11/17/2014 0143   LABSPEC 1.005 09/12/2013 1542   PHURINE 6.5 11/17/2014 0143   PHURINE 6.0 09/12/2013 1542   GLUCOSEU NEGATIVE 11/17/2014 0143   GLUCOSEU Negative 09/12/2013 1542   HGBUR NEGATIVE 11/17/2014 0143   HGBUR Negative 09/12/2013 1542   BILIRUBINUR NEGATIVE 11/17/2014 0143   BILIRUBINUR Negative 09/12/2013 1542   KETONESUR NEGATIVE 11/17/2014 0143   KETONESUR Negative 09/12/2013 1542   PROTEINUR NEGATIVE 11/17/2014 0143   PROTEINUR Negative 09/12/2013 1542   UROBILINOGEN 0.2 11/17/2014 0143   UROBILINOGEN 0.2 09/12/2013 1542   NITRITE NEGATIVE 11/17/2014 0143   NITRITE Negative 09/12/2013 1542   LEUKOCYTESUR NEGATIVE 11/17/2014 0143   LEUKOCYTESUR Negative 09/12/2013 1542    STUDIES: No results found.  ASSESSMENT: 54 y.o. [place] woman with stage IV breast cancer, history  as follows  (1)  S/p Right lumpectomy and sentinel lymph node sampling 03/15/2004 for a pT1c pN0. Stage IA invasive ductal carcinoma, grade 2, estrogen receptor 95% positive, progesterone receptor 65% positive, HER-2 not amplified; additional surgery 04/25/2004 for seroma or clearance showed no residual tumor  (2) adjuvant chemotherapy with cyclophosphamide and doxorubicin every 21 days x4 completed 07/19/2004  (3) adjuvant radiation given under Dr. Donella Stade in [place] completed July 2006  (4) the patient opted against adjuvant antiestrogen therapy  (5) genetics testing showed no BRCA mutations  (6) biopsy of a palpable right axillary mass 10/24/2009 showed invasive ductal carcinoma, grade 3, estrogen receptor 100% positive, progesterone receptor 2% positive (alert score 5) HER-2 negative; no evidence of systemic disease on PET scanning  (7) completed 3 of 4 planned cycles of docetaxel and cyclophosphamide September 2011, fourth cycle omitted because of marked elevations in liver function tests  (8) an right axillary lymph node dissection 03/06/2010 showed 3/8 lymph nodes removed to be involved by tumor, with extracapsular extension.  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, TTS-1 negative), estrogen receptor 80% positive, progesterone receptor 2% positive, HER-2 not amplified  (12) letrozole started March 2015-- tolerated with significant side effects, discontinued at the end of May 2015  (13) PET scan 08/16/2013 shows extensive left pleural metastatic disease and a large left pleural  effusion that  shifts cardiac and mediastinal structures to the right; adenopathy (celiac trunk, periadrenal, periaortic); and a left medial clavicular lesion; Status post left thoracentesis 08/16/2013 positive for adenocarcinoma, estrogen receptor positive, progesterone receptor negative.  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; everolimus was suggested as an available targeted agent  (18) exemestane started 03/31/2014, discontinued 10/31/2014 with evidence of progression  (a) everolimus added 04/03/2014 but not tolerated (cytopenias, elevated LFTs) even at minimal doses; stopped 04/17/2014  (19) fulvestrant started 12/20/2014    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 treated with Lovenox for 5-6 months  (c) right upper extremity lymphedema--receiving physical therapy  (d) hepatic steatosis with chronically elevated LFTs as well as unusual hepatic sensitivity to chemotherapy  (e) osteopenia with the lowest T score -1.6 on bone density scan 06/20/2013  (f) radiation oncology (Dr Valere Dross) has reviewed prior radiation records in case there is further mediastinal involvement with dysphagia etc in which case palliative XRT could be considered  (a) radiation to left mediastinum/ left 7th rib 3250 cGy in 13 sessions04/18/2016 through 08/02/2014  (b) radiation to T11 area: 22 Gy in 7 sessions, last dose  11/27/2014  (c) radiation left parietal scalp region to be completed 12/22/2014  (g) chest wall pain--improved post radiation treatments   PLAN: Redonna is having some side effects from the fulvestrant, but they are significantly milder than the radiation side effects or the side effects that she had from prior chemotherapies.  Unfortunately I am not going be able to help with the hair thinning problem. I did offer gabapentin for the nighttime hot flashes but she tells me she cannot take that medication--she has had bad experiences from it in the past. The physical therapy group is working closely with her regarding her lymphedema and right leg discomfort/pain. She is managing the fatigue as well as she can buy not letting herself given to it but remaining as active as possible.  Accordingly we are proceeding with fulvestrant today. She will return in one month to see me and the next dose, and she will have lab work the day before as we did this time, which works well for her) note that the liver function tests if any are improved).  She will then have a PET scan and CT of the chest November 19. She will come here an hour before that to get IV access which has been difficult for her in radiology in the past. I am also at her request throwing in a noncontrast head CT so we can look at the skull and scalp to assess the radiation treatments.  We again discussed adding Palbociclib. She is very reluctant to even take 1 tablet a week, but we are going to readdress that issue once we get the results from the PET scan in December.  Otherwise we are continuing with fulvestrant today. She knows to call for any problems that may develop before her next visit here.    Chauncey Cruel, MD   02/14/2015 8:21 AM

## 2015-02-14 NOTE — Telephone Encounter (Signed)
Appointments made and avs printed for patient,she will

## 2015-02-14 NOTE — Telephone Encounter (Signed)
Returned her call to add iv access for ct scan 12/13

## 2015-02-16 ENCOUNTER — Ambulatory Visit: Payer: 59 | Admitting: Physical Therapy

## 2015-02-16 DIAGNOSIS — I890 Lymphedema, not elsewhere classified: Secondary | ICD-10-CM

## 2015-02-16 DIAGNOSIS — M25551 Pain in right hip: Secondary | ICD-10-CM

## 2015-02-16 DIAGNOSIS — M6289 Other specified disorders of muscle: Secondary | ICD-10-CM

## 2015-02-16 NOTE — Therapy (Signed)
[place], [place], [address] Phone: [phone]   Fax:  [phone]  Physical Therapy Treatment  Patient Details  Name: [name] [name] MRN: [id number] Date of Birth: 1960-08-16 Referring Provider: Dr. Lurline Del  Encounter Date: 02/16/2015      PT End of Session - 02/16/15 1255    Visit Number 86  lymphedema only   Number of Visits 100   Date for PT Re-Evaluation 03/31/15   PT Start Time 1033   PT Stop Time 1109   PT Time Calculation (min) 36 min   Activity Tolerance Patient tolerated treatment well   Behavior During Therapy [facility] for tasks assessed/performed      Past Medical History  Diagnosis Date  . Seizures ([place]) 2010    Isolated incident.  Marland Kitchen PONV (postoperative nausea and vomiting)   . Peripheral vascular disease ([place]) 02/2010    blood clot related to porta cath  . Breast cancer ([place]) dx'd 2005/2011  . Bone metastases ([place]) dx'd 05/2014  . S/P radiation therapy 07/17/2014 through 08/02/2014     Left mediastinum, left seventh rib 3250 cGy in 13 sessions   . S/P radiation therapy 12/11/2014 through 12/22/2014     Left parietal calvarium 2400 cGy in 8 sessions     Past Surgical History  Procedure Laterality Date  . Breast lumpectomy  2005  . Axillary lymph node dissection  Dec. 2011  . Portacath placement  12/11  . Removal portacath    . Mediastinotomy chamberlain mcneil Left 06/02/2013    Procedure: MEDIASTINOTOMY CHAMBERLAIN MCNEIL;  Surgeon: [name], MD;  Location: [facility];  Service: Thoracic;  Laterality: Left;  LEFT ANTERIOR MEDIASTINOTOMY     There were no vitals filed for this visit.  Visit Diagnosis:  Lymphedema - Plan: PT plan of care cert/re-cert  Pain in joint involving right pelvic region and thigh -  Plan: PT plan of care cert/re-cert  Muscle stiffness - Plan: PT plan of care cert/re-cert      Subjective Assessment - 02/16/15 1033    Subjective This area is hard (swelling at right lateral breast).  Somewhat better in leg, but still painful.   Currently in Pain? Yes   Pain Score 6    Pain Location Axilla  also 5 in leg   Pain Orientation Right                         [facility] Adult PT Treatment/Exercise - 02/16/15 0001    Manual Therapy   Myofascial Release right axilla with focus at superior scar tightness   Manual Lymphatic Drainage (MLD) In left sidelying, posterior interaxillary anastomosis and right axillo-inguinal anastomosis; in supine, short neck, left axilla and anterior interaxillary anastomosis, right groin and axillo-inguinal anastomosis, area between right breast scars, directing toward pathways, and right upper arm.  In right sidelying, left periscapular area toward left groin.   Other Manual Therapy soft tissue work to right posteromedial thigh in right sidelying;                  PT Short Term Goals - 02/13/15 1313    PT SHORT TERM GOAL #1   Title pain with walking decreased >/= 25%   Time 3   Period Weeks   Status On-going   PT SHORT TERM GOAL #2   Title pelvis stays in correct alignment for 3 weeks   Time 3   Period Weeks   Status  On-going           PT Long Term Goals - 02/08/15 1127    PT LONG TERM GOAL #1   Title indpendent with HEP   Time 6   Period Weeks   Status New   PT LONG TERM GOAL #2   Title pain with walking decreased >/= 80%   Time 6   Period Weeks   Status New   PT LONG TERM GOAL #3   Title go up and down steps with a step over step pattern   Time 6   Period Weeks   Status New           Long Term Clinic Goals - 02/16/15 1259    CC Long Term Goal  #1   Title Reports left upper back and left side pain is controlled at 4/10 or less with therapy at the current frequency.   Status On-going   CC Long  Term Goal  #2   Title Pt. will report swelling is adequately managed to enable ADL function at a consistent level.   Status On-going   CC Long Term Goal  #3   Title Pain in area of right ischial tuberosity will be reduced to 1/10 or less, and patient will no longer need assistive device for ambulation.   Status On-going            Plan - 02/16/15 1257    Clinical Impression Statement Continues to have increased discomfort and swelling recently at right lateral breast area between two scars.  Also had left back pain during session today.   Pt will benefit from skilled therapeutic intervention in order to improve on the following deficits Increased edema;Pain;Increased fascial restricitons   Rehab Potential Good   Clinical Impairments Affecting Rehab Potential active cancer   PT Frequency 2x / week   PT Duration 6 weeks   PT Treatment/Interventions Manual techniques;Manual lymph drainage   PT Next Visit Plan for lymphedema, continue manual lymph drainage and manual techniques   Consulted and Agree with Plan of Care Patient        Problem List Patient Active Problem List   Diagnosis Date Noted  . Nausea with vomiting 11/18/2014  . Constipation 11/18/2014  . Left-sided thoracic back pain   . Bone metastases ([place]) 11/16/2014  . Back pain 11/15/2014  . 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 Uncontrolled pain 11/14/2014  . 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 Chest wall pain 03/21/2014  . Abnormal LFTs (liver function tests) 09/12/2013  . Pleural effusion 08/18/2013  . Breast cancer of upper-inner quadrant of right female breast ([place]) 08/18/2013  . 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

## 2015-02-19 ENCOUNTER — Ambulatory Visit: Payer: 59 | Admitting: Physical Therapy

## 2015-02-19 DIAGNOSIS — I890 Lymphedema, not elsewhere classified: Secondary | ICD-10-CM | POA: Diagnosis not present

## 2015-02-19 DIAGNOSIS — M6289 Other specified disorders of muscle: Secondary | ICD-10-CM

## 2015-02-19 DIAGNOSIS — M25551 Pain in right hip: Secondary | ICD-10-CM

## 2015-02-19 NOTE — Therapy (Signed)
[place], [place], [address] Phone: [phone]   Fax:  [phone]  Physical Therapy Treatment  Patient Details  Name: [name] [name] MRN: [id number] Date of Birth: [birth year] Referring Provider: Dr. Lurline Del  Encounter Date: 02/19/2015      PT End of Session - 02/19/15 2100    Visit Number 53  Cancer Rehab only   Number of Visits 100   Date for PT Re-Evaluation 03/31/15   PT Start Time 1016   PT Stop Time 1057   PT Time Calculation (min) 41 min   Activity Tolerance Patient tolerated treatment well   Behavior During Therapy [facility] for tasks assessed/performed      Past Medical History  Diagnosis Date  . Seizures ([place]) 2010    Isolated incident.  Marland Kitchen PONV (postoperative nausea and vomiting)   . Peripheral vascular disease ([place]) 02/2010    blood clot related to porta cath  . Breast cancer ([place]) dx'd 2005/2011  . Bone metastases ([place]) dx'd 05/2014  . S/P radiation therapy 07/17/2014 through 08/02/2014     Left mediastinum, left seventh rib 3250 cGy in 13 sessions   . S/P radiation therapy 12/11/2014 through 12/22/2014     Left parietal calvarium 2400 cGy in 8 sessions     Past Surgical History  Procedure Laterality Date  . Breast lumpectomy  2005  . Axillary lymph node dissection  Dec. 2011  . Portacath placement  12/11  . Removal portacath    . Mediastinotomy chamberlain mcneil Left 06/02/2013    Procedure: MEDIASTINOTOMY CHAMBERLAIN MCNEIL;  Surgeon: [name], MD;  Location: [place];  Service: Thoracic;  Laterality: Left;  LEFT ANTERIOR MEDIASTINOTOMY     There were no vitals filed for this visit.  Visit Diagnosis:  Lymphedema  Pain in joint involving right pelvic region and thigh  Muscle stiffness       Subjective Assessment - 02/19/15 1016    Subjective "Pretty full" on right; using the Flexitouch most days.   Currently in Pain? Yes   Pain Score 4    Pain Location --  thigh   Pain Orientation Right   Aggravating Factors  being up on it, walking   Pain Relieving Factors motrin                         [facility] Adult PT Treatment/Exercise - 02/19/15 0001    Manual Therapy   Myofascial Release right axilla with focus at superior scar tightness   Manual Lymphatic Drainage (MLD) In left sidelying, posterior interaxillary anastomosis and right axillo-inguinal anastomosis; in supine, short neck, left axilla and anterior interaxillary anastomosis, right groin and axillo-inguinal anastomosis, area between right breast scars, directing toward pathways, and right upper arm.  In right sidelying, left periscapular area toward left groin.   Other Manual Therapy soft tissue work to right posteromedial thigh in right sidelying;; focused more on posterior aspect than posteromedial today.                  PT Short Term Goals - 02/13/15 1313    PT SHORT TERM GOAL #1   Title pain with walking decreased >/= 25%   Time 3   Period Weeks   Status On-going   PT SHORT TERM GOAL #2   Title pelvis stays in correct alignment for 3 weeks   Time 3   Period Weeks   Status On-going  PT Long Term Goals - 02/08/15 1127    PT LONG TERM GOAL #1   Title indpendent with HEP   Time 6   Period Weeks   Status New   PT LONG TERM GOAL #2   Title pain with walking decreased >/= 80%   Time 6   Period Weeks   Status New   PT LONG TERM GOAL #3   Title go up and down steps with a step over step pattern   Time 6   Period Weeks   Status New           Long Term Clinic Goals - 02/19/15 2102    CC Long Term Goal  #1   Title Reports left upper back and left side pain is controlled at 4/10 or less with therapy at the current frequency.   Status On-going   CC Long Term Goal   #2   Title Pt. will report swelling is adequately managed to enable ADL function at a consistent level.   Status On-going   CC Long Term Goal  #3   Title Pain in area of right ischial tuberosity will be reduced to 1/10 or less, and patient will no longer need assistive device for ambulation.   Status On-going            Plan - 02/19/15 2101    Clinical Impression Statement Continued swelling and induration at right lateral breast, despite using Flexitouch pump at home regularly; leg pain more at central posterior thigh than posteromedial thigh today.   Pt will benefit from skilled therapeutic intervention in order to improve on the following deficits Increased edema;Pain;Increased fascial restricitons   Rehab Potential Good   Clinical Impairments Affecting Rehab Potential active cancer   PT Frequency 2x / week   PT Duration 6 weeks   PT Treatment/Interventions Manual techniques;Manual lymph drainage   PT Next Visit Plan for lymphedema, continue manual lymph drainage and manual techniques   Consulted and Agree with Plan of Care Patient        Problem List Patient Active Problem List   Diagnosis Date Noted  . Nausea with vomiting 11/18/2014  . Constipation 11/18/2014  . Left-sided thoracic back pain   . Bone metastases ([place]) 11/16/2014  . Back pain 11/15/2014  . 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 Uncontrolled pain 11/14/2014  . 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 Chest wall pain 03/21/2014  . Abnormal LFTs (liver function tests) 09/12/2013  . Pleural effusion 08/18/2013  . Breast cancer of upper-inner quadrant of right female breast ([place]) 08/18/2013  . 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

## 2015-02-21 ENCOUNTER — Ambulatory Visit: Payer: 59 | Admitting: Physical Therapy

## 2015-02-21 DIAGNOSIS — R1031 Right lower quadrant pain: Secondary | ICD-10-CM

## 2015-02-21 DIAGNOSIS — M25551 Pain in right hip: Secondary | ICD-10-CM | POA: Diagnosis not present

## 2015-02-21 NOTE — Therapy (Signed)
[facility] [address] [place], [place], [address] Phone: [phone]   Fax:  [phone]  Physical Therapy Treatment  Patient Details  Name: [name] [name] MRN: [id number] Date of Birth: [birth year] Referring Provider: Dr. Lurline Del  Encounter Date: 02/21/2015      PT End of Session - 02/21/15 1036    Visit Number 95   Number of Visits 104   Date for PT Re-Evaluation 03/22/15  pelvic   Authorization - Visit Number 95   Authorization - Number of Visits 104  total visits approved   PT Start Time 1015   PT Stop Time 1055   PT Time Calculation (min) 40 min   Activity Tolerance Patient tolerated treatment well   Behavior During Therapy [facility] for tasks assessed/performed      Past Medical History  Diagnosis Date  . Seizures ([place]) 2010    Isolated incident.  Marland Kitchen PONV (postoperative nausea and vomiting)   . Peripheral vascular disease ([place]) 02/2010    blood clot related to porta cath  . Breast cancer ([place]) dx'd 2005/2011  . Bone metastases ([place]) dx'd 05/2014  . S/P radiation therapy 07/17/2014 through 08/02/2014     Left mediastinum, left seventh rib 3250 cGy in 13 sessions   . S/P radiation therapy 12/11/2014 through 12/22/2014     Left parietal calvarium 2400 cGy in 8 sessions     Past Surgical History  Procedure Laterality Date  . Breast lumpectomy  2005  . Axillary lymph node dissection  Dec. 2011  . Portacath placement  12/11  . Removal portacath    . Mediastinotomy chamberlain mcneil Left 06/02/2013    Procedure: MEDIASTINOTOMY CHAMBERLAIN MCNEIL;  Surgeon: [name], MD;  Location: [place];  Service: Thoracic;  Laterality: Left;  LEFT ANTERIOR MEDIASTINOTOMY     There were no vitals filed for this visit.  Visit Diagnosis:  Pain in  joint involving right pelvic region and thigh  Groin pain, right      Subjective Assessment - 02/21/15 1024    Subjective I feel better.  The pain is now in the right groin. Patient reports pain is 35% better. I have 4 more appointments after today.    How long can you walk comfortably? make patient limp   Patient Stated Goals walking with minimal pain   Currently in Pain? Yes   Pain Score 6    Pain Location Groin   Pain Orientation Right   Pain Descriptors / Indicators Tightness   Pain Type Acute pain   Pain Onset More than a month ago   Pain Frequency Constant   Aggravating Factors  being up on it, walking   Pain Relieving Factors Motrin   Effect of Pain on Daily Activities difficulty walking   Multiple Pain Sites No                         [facility] Adult PT Treatment/Exercise - 02/21/15 0001    Manual Therapy   Manual Therapy Soft tissue mobilization   Soft tissue mobilization righ thip adductor, obturator internist, hamstring                PT Education - 02/21/15 1057    Education provided No          PT Short Term Goals - 02/21/15 1058    PT SHORT TERM GOAL #1   Title pain with walking decreased >/= 25%   Time 3  Period Weeks   Status Achieved   PT SHORT TERM GOAL #2   Title pelvis stays in correct alignment for 3 weeks   Time 3   Period Weeks   Status On-going           PT Long Term Goals - 02/21/15 1058    PT LONG TERM GOAL #1   Title indpendent with HEP   Time 6   Period Weeks   Status New   PT LONG TERM GOAL #2   Title pain with walking decreased >/= 80%   Time 6   Period Weeks   Status On-going  35% better   PT LONG TERM GOAL #3   Title go up and down steps with a step over step pattern   Time 6   Period Weeks   Status On-going           Long Term Clinic Goals - 02/19/15 2102    CC Long Term Goal  #1   Title Reports left upper back and left side pain is controlled at 4/10 or less with therapy at the  current frequency.   Status On-going   CC Long Term Goal  #2   Title Pt. will report swelling is adequately managed to enable ADL function at a consistent level.   Status On-going   CC Long Term Goal  #3   Title Pain in area of right ischial tuberosity will be reduced to 1/10 or less, and patient will no longer need assistive device for ambulation.   Status On-going            Plan - 02/21/15 1059    Clinical Impression Statement Patient has met STG for pain due to pain decreased by 35%. Patient has more tightness in right groin compared to the whole thigh.  Patient has trigger points in righ thip adductor insertion.  Patient was able to walk out of therap with riight limp reduced by 75%. Patient willbenefit form physical therapy to reduce pain and keep pelvis in alignment.    Pt will benefit from skilled therapeutic intervention in order to improve on the following deficits Pain;Increased muscle spasms;Decreased strength;Difficulty walking   Rehab Potential Excellent   Clinical Impairments Affecting Rehab Potential active cancer   PT Frequency 2x / week   PT Duration 6 weeks   PT Treatment/Interventions Manual techniques;Therapeutic exercise;Therapeutic activities;Neuromuscular re-education   PT Next Visit Plan soft tissue work to right pelvic floor and thigh muscle. check pelvic alignment   PT Home Exercise Plan core stability   Consulted and Agree with Plan of Care Patient   PT Plan Continue manual lymph drainage, soft tissue work.        Problem List Patient Active Problem List   Diagnosis Date Noted  . Nausea with vomiting 11/18/2014  . Constipation 11/18/2014  . Left-sided thoracic back pain   . Bone metastases ([place]) 11/16/2014  . Back pain 11/15/2014  . 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 Uncontrolled pain 11/14/2014  . 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 Chest wall pain 03/21/2014  . Abnormal LFTs (liver function  tests) 09/12/2013  . Pleural effusion 08/18/2013  . Breast cancer of upper-inner quadrant of right female breast ([place]) 08/18/2013  . 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, [place], [place], [address] Phone: [phone]   Fax:  [phone]  Name: MILLIE FORDE MRN: [id number] Date of Birth: 1960/04/19

## 2015-02-23 ENCOUNTER — Encounter: Payer: 59 | Admitting: Physical Therapy

## 2015-02-26 ENCOUNTER — Ambulatory Visit: Payer: 59 | Admitting: Physical Therapy

## 2015-02-26 DIAGNOSIS — M6289 Other specified disorders of muscle: Secondary | ICD-10-CM

## 2015-02-26 DIAGNOSIS — I890 Lymphedema, not elsewhere classified: Secondary | ICD-10-CM

## 2015-02-26 DIAGNOSIS — M25551 Pain in right hip: Secondary | ICD-10-CM

## 2015-02-26 NOTE — Therapy (Signed)
[place], [place], [address] Phone: [phone]   Fax:  [phone]  Physical Therapy Treatment  Patient Details  Name: [name] [name] MRN: [id number] Date of Birth: 05-03-1960 Referring Provider: Dr. Lurline Del  Encounter Date: 02/26/2015      PT End of Session - 02/26/15 1711    Visit Number 96   Number of Visits 104   Date for PT Re-Evaluation 03/31/15   PT Start Time [id number]   PT Stop Time 1105   PT Time Calculation (min) 42 min   Activity Tolerance Patient tolerated treatment well   Behavior During Therapy [facility] for tasks assessed/performed      Past Medical History  Diagnosis Date  . Seizures ([place]) 2010    Isolated incident.  Marland Kitchen PONV (postoperative nausea and vomiting)   . Peripheral vascular disease ([place]) 02/2010    blood clot related to porta cath  . Breast cancer ([place]) dx'd 2005/2011  . Bone metastases ([place]) dx'd 05/2014  . S/P radiation therapy 07/17/2014 through 08/02/2014     Left mediastinum, left seventh rib 3250 cGy in 13 sessions   . S/P radiation therapy 12/11/2014 through 12/22/2014     Left parietal calvarium 2400 cGy in 8 sessions     Past Surgical History  Procedure Laterality Date  . Breast lumpectomy  2005  . Axillary lymph node dissection  Dec. 2011  . Portacath placement  12/11  . Removal portacath    . Mediastinotomy chamberlain mcneil Left 06/02/2013    Procedure: MEDIASTINOTOMY CHAMBERLAIN MCNEIL;  Surgeon: [name], MD;  Location: [place];  Service: Thoracic;  Laterality: Left;  LEFT ANTERIOR MEDIASTINOTOMY     There were no vitals filed for this visit.  Visit Diagnosis:  Pain in joint involving right pelvic region and thigh  Lymphedema  Muscle stiffness      Subjective Assessment -  02/26/15 1025    Subjective Had a good Thanksgiving. Feels like the area at the sternum and just left of it is swollen.   Currently in Pain? Yes   Pain Score 6    Pain Location Axilla  right leg better, but 4/10   Pain Orientation Right   Pain Relieving Factors therapy                         [facility] Adult PT Treatment/Exercise - 02/26/15 0001    Manual Therapy   Myofascial Release right axilla with focus at superior scar tightness   Manual Lymphatic Drainage (MLD) In left sidelying, posterior interaxillary anastomosis and right axillo-inguinal anastomosis; in supine, short neck, left axilla and anterior interaxillary anastomosis, right groin and axillo-inguinal anastomosis, area between right breast scars, directing toward pathways, and right upper arm.  In right sidelying, left periscapular area toward left groin.   Other Manual Therapy soft tissue work to right posteromedial thigh in right sidelying;; focused more on posterior aspect than posteromedial today.                  PT Short Term Goals - 02/21/15 1058    PT SHORT TERM GOAL #1   Title pain with walking decreased >/= 25%   Time 3   Period Weeks   Status Achieved   PT SHORT TERM GOAL #2   Title pelvis stays in correct alignment for 3 weeks   Time 3   Period Weeks   Status On-going  PT Long Term Goals - 02/21/15 1058    PT LONG TERM GOAL #1   Title indpendent with HEP   Time 6   Period Weeks   Status New   PT LONG TERM GOAL #2   Title pain with walking decreased >/= 80%   Time 6   Period Weeks   Status On-going  35% better   PT LONG TERM GOAL #3   Title go up and down steps with a step over step pattern   Time 6   Period Weeks   Status On-going           Long Term Clinic Goals - 02/26/15 1714    CC Long Term Goal  #1   Title Reports left upper back and left side pain is controlled at 4/10 or less with therapy at the current frequency.   Status On-going   CC Long  Term Goal  #2   Title Pt. will report swelling is adequately managed to enable ADL function at a consistent level.   Status On-going            Plan - 02/26/15 1712    Clinical Impression Statement Area between two scars at right lateral breast was less indurated feeling but still full with fluid today.  Challenge is ongoing, despite patient using her Flexitouch pump at home.   Pt will benefit from skilled therapeutic intervention in order to improve on the following deficits Increased edema;Pain;Increased fascial restricitons   Rehab Potential Excellent   Clinical Impairments Affecting Rehab Potential active cancer   PT Frequency 2x / week   PT Duration 6 weeks   PT Treatment/Interventions Manual lymph drainage;Manual techniques   PT Next Visit Plan continue manual lymph drainage and soft tissue work in cancer rehab   Consulted and Agree with Plan of Care Patient        Problem List Patient Active Problem List   Diagnosis Date Noted  . Nausea with vomiting 11/18/2014  . Constipation 11/18/2014  . Left-sided thoracic back pain   . Bone metastases ([place]) 11/16/2014  . Back pain 11/15/2014  . 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 Uncontrolled pain 11/14/2014  . 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 Chest wall pain 03/21/2014  . Abnormal LFTs (liver function tests) 09/12/2013  . Pleural effusion 08/18/2013  . Breast cancer of upper-inner quadrant of right female breast ([place]) 08/18/2013  . 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

## 2015-02-27 ENCOUNTER — Encounter: Payer: Self-pay | Admitting: Physical Therapy

## 2015-02-27 ENCOUNTER — Telehealth: Payer: Self-pay

## 2015-02-27 ENCOUNTER — Ambulatory Visit: Payer: 59 | Admitting: Physical Therapy

## 2015-02-27 DIAGNOSIS — M25551 Pain in right hip: Secondary | ICD-10-CM

## 2015-02-27 DIAGNOSIS — M6289 Other specified disorders of muscle: Secondary | ICD-10-CM

## 2015-02-27 DIAGNOSIS — R1031 Right lower quadrant pain: Secondary | ICD-10-CM

## 2015-02-27 NOTE — Therapy (Signed)
[facility] [address] [place], [place], [address] Phone: [phone]   Fax:  [phone]  Physical Therapy Treatment  Patient Details  Name: [name] [name] MRN: [id number] Date of Birth: 28-Oct-1960 Referring Provider: Dr. Lurline Del  Encounter Date: 02/27/2015      PT End of Session - 02/27/15 0941    Visit Number 97   Number of Visits 104   Date for PT Re-Evaluation 03/31/15   Authorization - Visit Number 56   Authorization - Number of Visits 104  total visits approved   PT Start Time 0940   PT Stop Time 1018   PT Time Calculation (min) 38 min   Activity Tolerance Patient tolerated treatment well   Behavior During Therapy [facility] for tasks assessed/performed      Past Medical History  Diagnosis Date  . Seizures ([place]) 2010    Isolated incident.  Marland Kitchen PONV (postoperative nausea and vomiting)   . Peripheral vascular disease ([place]) 02/2010    blood clot related to porta cath  . Breast cancer ([place]) dx'd 2005/2011  . Bone metastases ([place]) dx'd 05/2014  . S/P radiation therapy 07/17/2014 through 08/02/2014     Left mediastinum, left seventh rib 3250 cGy in 13 sessions   . S/P radiation therapy 12/11/2014 through 12/22/2014     Left parietal calvarium 2400 cGy in 8 sessions     Past Surgical History  Procedure Laterality Date  . Breast lumpectomy  2005  . Axillary lymph node dissection  Dec. 2011  . Portacath placement  12/11  . Removal portacath    . Mediastinotomy chamberlain mcneil Left 06/02/2013    Procedure: MEDIASTINOTOMY CHAMBERLAIN MCNEIL;  Surgeon: [name], MD;  Location: [place];  Service: Thoracic;  Laterality: Left;  LEFT ANTERIOR MEDIASTINOTOMY     There were no vitals filed for this visit.  Visit Diagnosis:  Pain in joint  involving right pelvic region and thigh  Muscle stiffness  Groin pain, right      Subjective Assessment - 02/27/15 0939    Subjective I am still limping at times.  I am improved since last week.  I am watching how long I am standing and doing the stairs.  The limp is 40% better.  I walked 1 1/2 mile two days.    Patient Stated Goals walking with minimal pain   Currently in Pain? Yes   Pain Score 4   later on in day is 5/10.    Pain Location Groin   Pain Orientation Right   Pain Descriptors / Indicators Tightness;Aching   Pain Type Acute pain   Pain Radiating Towards down right knee   Pain Onset More than a month ago   Pain Frequency Constant   Aggravating Factors  as the day progresses and more usage   Pain Relieving Factors therapy   Effect of Pain on Daily Activities difficult walking   Multiple Pain Sites No            [facility] PT Assessment - 02/27/15 0001    Palpation   SI assessment  pelvis in correct alignment                     [facility] Adult PT Treatment/Exercise - 02/27/15 0001    Manual Therapy   Manual Therapy Soft tissue mobilization   Soft tissue mobilization right hip adductor, obturator internist, hamstring, righ tiliotibial band, righ tquads   Other Manual Therapy soft tissue work to  right posteromedial thigh in right sidelying;; focused more on posterior aspect than posteromedial today.                PT Education - 02/27/15 1010    Education provided No          PT Short Term Goals - 02/27/15 1011    PT SHORT TERM GOAL #1   Title pain with walking decreased >/= 25%   Time 3   Period Weeks   Status Achieved   PT SHORT TERM GOAL #2   Title pelvis stays in correct alignment for 3 weeks   Time 3   Period Weeks   Status Achieved           PT Long Term Goals - 02/21/15 1058    PT LONG TERM GOAL #1   Title indpendent with HEP   Time 6   Period Weeks   Status New   PT LONG TERM GOAL #2   Title pain with walking  decreased >/= 80%   Time 6   Period Weeks   Status On-going  35% better   PT LONG TERM GOAL #3   Title go up and down steps with a step over step pattern   Time 6   Period Weeks   Status On-going           Long Term Clinic Goals - 02/26/15 1714    CC Long Term Goal  #1   Title Reports left upper back and left side pain is controlled at 4/10 or less with therapy at the current frequency.   Status On-going   CC Long Term Goal  #2   Title Pt. will report swelling is adequately managed to enable ADL function at a consistent level.   Status On-going            Plan - 02/27/15 1011    Clinical Impression Statement Patient has trigger points in right hip adductors and obturator internist.  Patient reports her limp is 40% better. Patient reports her pain is worse as the day goes on.  Patient has met her STG's.     Pt will benefit from skilled therapeutic intervention in order to improve on the following deficits Pain;Increased fascial restricitons;Decreased strength;Decreased endurance   Rehab Potential Excellent   Clinical Impairments Affecting Rehab Potential active cancer   PT Frequency 2x / week   PT Duration 6 weeks   PT Treatment/Interventions Manual techniques;Therapeutic exercise;Neuromuscular re-education;Patient/family education   PT Next Visit Plan continue with soft tissue work   PT Home Exercise Plan core stability   Consulted and Agree with Plan of Care Patient   PT Plan Continue manual lymph drainage, soft tissue work.        Problem List Patient Active Problem List   Diagnosis Date Noted  . Nausea with vomiting 11/18/2014  . Constipation 11/18/2014  . Left-sided thoracic back pain   . Bone metastases ([place]) 11/16/2014  . Back pain 11/15/2014  . 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 Uncontrolled pain 11/14/2014  . 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 Chest wall pain 03/21/2014  . Abnormal LFTs (liver  function tests) 09/12/2013  . Pleural effusion 08/18/2013  . Breast cancer of upper-inner quadrant of right female breast ([place]) 08/18/2013  . 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, [place], [place], [address] Phone:  [phone]   Fax:  [phone]  Name: [name] [name] MRN: [id number] Date of Birth: 1961/02/02

## 2015-02-27 NOTE — Telephone Encounter (Signed)
Patient wanted Dr. Jana Hakim opinion on what probiotic she should take and he suggested florastore- which she will start tomorrow.  She will start on the lowest dose which will be one daily before breakfast. Patient also had another concern that she would like Dr. Jana Hakim to be aware of and that is the area on her left side near the pectoral area which was radiated last year.  She feels a "possible lump", the area is tight and slightly painful.  She feels that the "tissue is thickening".  Patient knows she has scans in the near future and a appt and is fine with waiting but wanted Dr. Jana Hakim to be aware.

## 2015-03-02 ENCOUNTER — Encounter: Payer: 59 | Admitting: Physical Therapy

## 2015-03-05 ENCOUNTER — Ambulatory Visit: Payer: 59 | Attending: Oncology | Admitting: Physical Therapy

## 2015-03-05 DIAGNOSIS — M25551 Pain in right hip: Secondary | ICD-10-CM

## 2015-03-05 DIAGNOSIS — I890 Lymphedema, not elsewhere classified: Secondary | ICD-10-CM | POA: Diagnosis present

## 2015-03-05 DIAGNOSIS — R1031 Right lower quadrant pain: Secondary | ICD-10-CM | POA: Diagnosis present

## 2015-03-05 DIAGNOSIS — G729 Myopathy, unspecified: Secondary | ICD-10-CM | POA: Diagnosis present

## 2015-03-05 DIAGNOSIS — M6289 Other specified disorders of muscle: Secondary | ICD-10-CM

## 2015-03-05 NOTE — Therapy (Signed)
[place], [place], [address] Phone: [phone]   Fax:  [phone]  Physical Therapy Treatment  Patient Details  Name: [name] [name] MRN: [id number] Date of Birth: 12-30-1960 Referring Provider: Dr. Lurline Del  Encounter Date: 03/05/2015      PT End of Session - 03/05/15 1220    Visit Number 98   Number of Visits 104   Date for PT Re-Evaluation 03/31/15   PT Start Time 1020   PT Stop Time 1102   PT Time Calculation (min) 42 min   Activity Tolerance Patient tolerated treatment well   Behavior During Therapy [facility] for tasks assessed/performed      Past Medical History  Diagnosis Date  . Seizures ([place]) 2010    Isolated incident.  Marland Kitchen PONV (postoperative nausea and vomiting)   . Peripheral vascular disease ([place]) 02/2010    blood clot related to porta cath  . Breast cancer ([place]) dx'd 2005/2011  . Bone metastases ([place]) dx'd 05/2014  . S/P radiation therapy 07/17/2014 through 08/02/2014     Left mediastinum, left seventh rib 3250 cGy in 13 sessions   . S/P radiation therapy 12/11/2014 through 12/22/2014     Left parietal calvarium 2400 cGy in 8 sessions     Past Surgical History  Procedure Laterality Date  . Breast lumpectomy  2005  . Axillary lymph node dissection  Dec. 2011  . Portacath placement  12/11  . Removal portacath    . Mediastinotomy chamberlain mcneil Left 06/02/2013    Procedure: MEDIASTINOTOMY CHAMBERLAIN MCNEIL;  Surgeon: [name], MD;  Location: [place];  Service: Thoracic;  Laterality: Left;  LEFT ANTERIOR MEDIASTINOTOMY     There were no vitals filed for this visit.  Visit Diagnosis:  Lymphedema  Pain in joint involving right pelvic region and thigh  Muscle stiffness      Subjective Assessment -  03/05/15 1021    Subjective Doing worse since my last session for the groin and hip area; had a lot of pain over the weekend.  Problem was getting in and out of the car.   Currently in Pain? Yes   Pain Score 6    Pain Location Axilla   Pain Orientation Right   Pain Score 5   Pain Location Leg   Pain Orientation Right   Aggravating Factors  last PT session   Pain Relieving Factors lying supine                         [facility] Adult PT Treatment/Exercise - 03/05/15 0001    Manual Therapy   Myofascial Release right axilla with focus at superior scar tightness   Manual Lymphatic Drainage (MLD) In left sidelying, posterior interaxillary anastomosis and right axillo-inguinal anastomosis; in supine, short neck, left axilla and anterior interaxillary anastomosis, right groin and axillo-inguinal anastomosis, area between right breast scars, directing toward pathways, and right upper arm.  In right sidelying, left periscapular area toward left groin.   Other Manual Therapy With legs elevated on bolster, soft tissue work to right thigh (lateral, anterior, and medical aspects) for muscle relaxation and pain relief.                  PT Short Term Goals - 02/27/15 1011    PT SHORT TERM GOAL #1   Title pain with walking decreased >/= 25%   Time 3   Period Weeks   Status Achieved   PT  SHORT TERM GOAL #2   Title pelvis stays in correct alignment for 3 weeks   Time 3   Period Weeks   Status Achieved           PT Long Term Goals - 02/21/15 1058    PT LONG TERM GOAL #1   Title indpendent with HEP   Time 6   Period Weeks   Status New   PT LONG TERM GOAL #2   Title pain with walking decreased >/= 80%   Time 6   Period Weeks   Status On-going  35% better   PT LONG TERM GOAL #3   Title go up and down steps with a step over step pattern   Time 6   Period Weeks   Status On-going           Long Term Clinic Goals - 03/05/15 1223    CC Long Term Goal  #1    Title Reports left upper back and left side pain is controlled at 4/10 or less with therapy at the current frequency.   Status On-going   CC Long Term Goal  #2   Title Pt. will report swelling is adequately managed to enable ADL function at a consistent level.   Status On-going   CC Long Term Goal  #3   Title Pain in area of right ischial tuberosity will be reduced to 1/10 or less, and patient will no longer need assistive device for ambulation.   Status On-going            Plan - 03/05/15 1221    Clinical Impression Statement Right leg pain has worsened in the last week; continues to have fluid at right axilla area, despite using Flexitouch pump; it is helped by therapy, but since we have only been treating this once a week, it continues to be a problem.   Pt will benefit from skilled therapeutic intervention in order to improve on the following deficits Increased edema;Pain;Increased muscle spasms   Rehab Potential Good   Clinical Impairments Affecting Rehab Potential active cancer   PT Frequency 2x / week   PT Duration 6 weeks   PT Treatment/Interventions Manual lymph drainage;Manual techniques   PT Next Visit Plan For lymphedema, continue manual lymph drainage; soft tissue work and myofascial release for soft tissue pain and tightness.   Consulted and Agree with Plan of Care Patient        Problem List Patient Active Problem List   Diagnosis Date Noted  . Nausea with vomiting 11/18/2014  . Constipation 11/18/2014  . Left-sided thoracic back pain   . Bone metastases ([place]) 11/16/2014  . Back pain 11/15/2014  . 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 Uncontrolled pain 11/14/2014  . 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 Chest wall pain 03/21/2014  . Abnormal LFTs (liver function tests) 09/12/2013  . Pleural effusion 08/18/2013  . Breast cancer of upper-inner quadrant of right female breast ([place]) 08/18/2013  . 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

## 2015-03-06 ENCOUNTER — Encounter: Payer: Self-pay | Admitting: Physical Therapy

## 2015-03-06 ENCOUNTER — Ambulatory Visit: Payer: 59 | Attending: Oncology | Admitting: Physical Therapy

## 2015-03-06 DIAGNOSIS — G729 Myopathy, unspecified: Secondary | ICD-10-CM | POA: Insufficient documentation

## 2015-03-06 DIAGNOSIS — M6289 Other specified disorders of muscle: Secondary | ICD-10-CM

## 2015-03-06 DIAGNOSIS — R1031 Right lower quadrant pain: Secondary | ICD-10-CM | POA: Diagnosis present

## 2015-03-06 DIAGNOSIS — C7951 Secondary malignant neoplasm of bone: Secondary | ICD-10-CM | POA: Diagnosis not present

## 2015-03-06 DIAGNOSIS — M25551 Pain in right hip: Secondary | ICD-10-CM | POA: Diagnosis not present

## 2015-03-06 NOTE — Patient Instructions (Signed)
Patient requesting an extension on her PT.  Letter written and signed by Dr. Jana Hakim.  Patient will pick it up on 03/13/15.

## 2015-03-06 NOTE — Therapy (Signed)
[facility] [address], [place], [place], [address] Phone: [phone]   Fax:  [phone]  Physical Therapy Treatment  Patient Details  Name: [name] [name] MRN: [id number] Date of Birth: [birth year] Referring Provider: Dr. Lurline Del  Encounter Date: 03/06/2015      PT End of Session - 03/06/15 1021    Visit Number 99   Number of Visits 104   Date for PT Re-Evaluation 03/31/15   Authorization - Visit Number 19   Authorization - Number of Visits 104  total visits approved   PT Start Time 1015   PT Stop Time 1055   PT Time Calculation (min) 40 min   Activity Tolerance Patient tolerated treatment well   Behavior During Therapy [facility] for tasks assessed/performed      Past Medical History  Diagnosis Date  . Seizures ([place]) 2010    Isolated incident.  Marland Kitchen PONV (postoperative nausea and vomiting)   . Peripheral vascular disease ([place]) 02/2010    blood clot related to porta cath  . Breast cancer ([place]) dx'd 2005/2011  . Bone metastases ([place]) dx'd 05/2014  . S/P radiation therapy 07/17/2014 through 08/02/2014     Left mediastinum, left seventh rib 3250 cGy in 13 sessions   . S/P radiation therapy 12/11/2014 through 12/22/2014     Left parietal calvarium 2400 cGy in 8 sessions     Past Surgical History  Procedure Laterality Date  . Breast lumpectomy  2005  . Axillary lymph node dissection  Dec. 2011  . Portacath placement  12/11  . Removal portacath    . Mediastinotomy chamberlain mcneil Left 06/02/2013    Procedure: MEDIASTINOTOMY CHAMBERLAIN MCNEIL;  Surgeon: [name], MD;  Location: [place];  Service: Thoracic;  Laterality: Left;  LEFT ANTERIOR MEDIASTINOTOMY     There were no vitals filed for this visit.  Visit Diagnosis:  Pain in joint  involving right pelvic region and thigh  Muscle stiffness  Groin pain, right      Subjective Assessment - 03/06/15 1021    Subjective since last visit I had tightness in right thigh, right groin, right hip adductor   Pertinent History patient vomited just prior to treatment today   How long can you walk comfortably? make patient limp   Patient Stated Goals walking with minimal pain   Currently in Pain? Yes   Pain Score 6   afternoos 7/10   Pain Location Groin  right thigh   Pain Orientation Right   Pain Descriptors / Indicators Aching;Tightness   Pain Type Acute pain   Pain Radiating Towards down right knee   Pain Frequency Constant   Aggravating Factors  as the day progresses   Pain Relieving Factors massage   Effect of Pain on Daily Activities difficulty with walking   Multiple Pain Sites No   Multiple Pain Sites No            [facility] PT Assessment - 03/06/15 0001    Palpation   SI assessment  pelvis in correct alignment                  Pelvic Floor Special Questions - 03/06/15 0001    Pelvic Floor Internal Exam Patient approves physical therapist to perform pelvic floor muscle assessment   Exam Type Vaginal           [facility] Adult PT Treatment/Exercise - 03/06/15 0001    Manual Therapy   Manual Therapy Soft tissue mobilization  Soft tissue mobilization right hip adductor, obturator internist, hamstring, righ tiliotibial band, righ tquads   Internal Pelvic Floor right obturator internist, right puborectalis, right iliococcygeus                PT Education - 03/06/15 1054    Education provided No          PT Short Term Goals - 02/27/15 1011    PT SHORT TERM GOAL #1   Title pain with walking decreased >/= 25%   Time 3   Period Weeks   Status Achieved   PT SHORT TERM GOAL #2   Title pelvis stays in correct alignment for 3 weeks   Time 3   Period Weeks   Status Achieved           PT Long Term Goals - 03/06/15 1023    PT  LONG TERM GOAL #1   Title indpendent with HEP   Time 6   Period Weeks   Status On-going  ongoing   PT LONG TERM GOAL #2   Title pain with walking decreased >/= 80%   Time 6   Period Weeks   Status On-going  got worse   PT LONG TERM GOAL #3   Title go up and down steps with a step over step pattern   Time 6   Period Weeks   Status On-going  avoiding steps           Long Term Clinic Goals - 03/05/15 1223    CC Long Term Goal  #1   Title Reports left upper back and left side pain is controlled at 4/10 or less with therapy at the current frequency.   Status On-going   CC Long Term Goal  #2   Title Pt. will report swelling is adequately managed to enable ADL function at a consistent level.   Status On-going   CC Long Term Goal  #3   Title Pain in area of right ischial tuberosity will be reduced to 1/10 or less, and patient will no longer need assistive device for ambulation.   Status On-going            Plan - 03/06/15 1054    Clinical Impression Statement Patient is a 54 year old with right groin pain.  Patient pelvis is staying in correct alignment.  Patient has increased limp on the right.  Patient had increased pain in right and thigh at level 6/10 and after therapy decreased to 4/10.  Patient would benfit from physical therapy to reduce pain and reduce limp on right.    Pt will benefit from skilled therapeutic intervention in order to improve on the following deficits Pain;Increased muscle spasms;Increased fascial restricitons;Difficulty walking   Rehab Potential Good   Clinical Impairments Affecting Rehab Potential active cancer   PT Frequency 2x / week   PT Duration 6 weeks   PT Treatment/Interventions Therapeutic exercise;Therapeutic activities;Neuromuscular re-education;Passive range of motion;Manual techniques   PT Next Visit Plan for soft tissue work to pelvic floor muscles and right thight   PT Home Exercise Plan core stability   Consulted and Agree with Plan  of Care Patient   PT Plan Continue manual lymph drainage, soft tissue work.        Problem List Patient Active Problem List   Diagnosis Date Noted  . Nausea with vomiting 11/18/2014  . Constipation 11/18/2014  . Left-sided thoracic back pain   . Bone metastases ([place]) 11/16/2014  . Back pain 11/15/2014  .  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 Uncontrolled pain 11/14/2014  . 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 Chest wall pain 03/21/2014  . Abnormal LFTs (liver function tests) 09/12/2013  . Pleural effusion 08/18/2013  . Breast cancer of upper-inner quadrant of right female breast ([place]) 08/18/2013  . 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 [place], [place], [address] Phone: [phone]   Fax:  [phone]  Name: [name] [name] MRN: [id number] Date of Birth: 08-17-1960

## 2015-03-07 ENCOUNTER — Encounter: Payer: Self-pay | Admitting: Physical Therapy

## 2015-03-07 ENCOUNTER — Ambulatory Visit: Payer: 59 | Admitting: Physical Therapy

## 2015-03-07 DIAGNOSIS — M6289 Other specified disorders of muscle: Secondary | ICD-10-CM

## 2015-03-07 DIAGNOSIS — M25551 Pain in right hip: Secondary | ICD-10-CM | POA: Diagnosis not present

## 2015-03-07 DIAGNOSIS — R1031 Right lower quadrant pain: Secondary | ICD-10-CM

## 2015-03-07 NOTE — Therapy (Signed)
[facility] [address] [place], [place], [address] Phone: [phone]   Fax:  [phone]  Physical Therapy Treatment  Patient Details  Name: [name] [name] MRN: [id number] Date of Birth: September 04, 1960 Referring Provider: Dr. Lurline Del  Encounter Date: 03/07/2015      [name] [name] End of Session - 03/07/15 1107    Visit Number 100   Number of Visits 104   Date for [name] [name] Re-Evaluation 03/31/15   Authorization - Visit Number 100   Authorization - Number of Visits 104  total visits approved   [name] [name] Start Time 1105   [name] [name] Stop Time 1145   [name] [name] Time Calculation (min) 40 min   Activity Tolerance Patient tolerated treatment well   Behavior During Therapy [facility] for tasks assessed/performed      Past Medical History  Diagnosis Date  . Seizures ([place]) 2010    Isolated incident.  Marland Kitchen PONV (postoperative nausea and vomiting)   . Peripheral vascular disease ([place]) 02/2010    blood clot related to porta cath  . Breast cancer ([place]) dx'd 2005/2011  . Bone metastases ([place]) dx'd 05/2014  . S/P radiation therapy 07/17/2014 through 08/02/2014     Left mediastinum, left seventh rib 3250 cGy in 13 sessions   . S/P radiation therapy 12/11/2014 through 12/22/2014     Left parietal calvarium 2400 cGy in 8 sessions     Past Surgical History  Procedure Laterality Date  . Breast lumpectomy  2005  . Axillary lymph node dissection  Dec. 2011  . Portacath placement  12/11  . Removal portacath    . Mediastinotomy chamberlain mcneil Left 06/02/2013    Procedure: MEDIASTINOTOMY CHAMBERLAIN MCNEIL;  Surgeon: [name], MD;  Location: [place];  Service: Thoracic;  Laterality: Left;  LEFT ANTERIOR MEDIASTINOTOMY     There were no vitals filed for this visit.  Visit Diagnosis:  Pain in joint  involving right pelvic region and thigh  Muscle stiffness  Groin pain, right      Subjective Assessment - 03/07/15 1107    Subjective The groin pain is better.  I still have tightness located in lateral right thigh.    How long can you walk comfortably? make patient limp   Patient Stated Goals walking with minimal pain   Currently in Pain? Yes   Pain Score 5    Pain Location Hip   Pain Orientation Right   Pain Descriptors / Indicators Aching;Tightness   Pain Type Acute pain   Pain Radiating Towards down right knee   Pain Onset More than a month ago   Pain Frequency Constant   Aggravating Factors  as day progresses   Pain Relieving Factors massge   Effect of Pain on Daily Activities difficulty with walking   Multiple Pain Sites No                      Pelvic Floor Special Questions - 03/07/15 0001    Pelvic Floor Internal Exam Patient approves physical therapist to perform pelvic floor muscle assessment   Exam Type Vaginal           [facility] Adult [name] [name] Treatment/Exercise - 03/07/15 0001    Knee/Hip Exercises: Standing   Knee Flexion Strengthening;Right;10 reps   Hip Abduction Right;1 set;10 reps   Hip Extension Right;1 set;10 reps   Manual Therapy   Soft tissue mobilization right quad, iliotibial band, hamstring, around  right greater trochanter   Internal Pelvic Floor right obturator  internist, right puborectalis, right iliococcygeus                [name] [name] Education - 03/07/15 1139    Education provided Yes   Education Details reviewed HEP and patient able to return demonstration of hip exercises correctly   Person(s) Educated Patient   Methods Explanation;Demonstration   Comprehension Verbalized understanding;Returned demonstration          [name] [name] Short Term Goals - 02/27/15 1011    [name] [name] SHORT TERM GOAL #1   Title pain with walking decreased >/= 25%   Time 3   Period Weeks   Status Achieved   [name] [name] SHORT TERM GOAL #2   Title pelvis stays in correct  alignment for 3 weeks   Time 3   Period Weeks   Status Achieved           [name] [name] Long Term Goals - 03/06/15 1023    [name] [name] LONG TERM GOAL #1   Title indpendent with HEP   Time 6   Period Weeks   Status On-going  ongoing   [name] [name] LONG TERM GOAL #2   Title pain with walking decreased >/= 80%   Time 6   Period Weeks   Status On-going  got worse   [name] [name] LONG TERM GOAL #3   Title go up and down steps with a step over step pattern   Time 6   Period Weeks   Status On-going  avoiding steps           Long Term Clinic Goals - 03/05/15 1223    CC Long Term Goal  #1   Title Reports left upper back and left side pain is controlled at 4/10 or less with therapy at the current frequency.   Status On-going   CC Long Term Goal  #2   Title [name] [name]. will report swelling is adequately managed to enable ADL function at a consistent level.   Status On-going   CC Long Term Goal  #3   Title Pain in area of right ischial tuberosity will be reduced to 1/10 or less, and patient will no longer need assistive device for ambulation.   Status On-going            Plan - 03/07/15 1140    Clinical Impression Statement Patient is a 54 year old with righ tgroin pain.  Patient continues to have trigger points on right pelvic floor muscles, right lateral hamstring, right lateral quadricep.  Patient able to walk out of therapy without a limp compared to coming into therapy with a limp on the left.  Patient is independent with her currect HEP.  Patient will benefit form physical therapy to reduce pain.    [name] [name] will benefit from skilled therapeutic intervention in order to improve on the following deficits Pain;Increased muscle spasms;Increased fascial restricitons;Difficulty walking   Rehab Potential Good   Clinical Impairments Affecting Rehab Potential active cancer   [name] [name] Frequency 2x / week   [name] [name] Duration 6 weeks   [name] [name] Treatment/Interventions Therapeutic exercise;Therapeutic activities;Neuromuscular re-education;Passive  range of motion;Manual techniques   [name] [name] Next Visit Plan for soft tissue work to pelvic floor muscles and right thight   [name] [name] Home Exercise Plan progress as needed   Consulted and Agree with Plan of Care Patient   [name] [name] Plan Continue manual lymph drainage, soft tissue work.        Problem List Patient Active Problem List   Diagnosis Date Noted  . Nausea with vomiting 11/18/2014  . Constipation 11/18/2014  .  Left-sided thoracic back pain   . Bone metastases ([place]) 11/16/2014  . Back pain 11/15/2014  . 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 Uncontrolled pain 11/14/2014  . 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 Chest wall pain 03/21/2014  . Abnormal LFTs (liver function tests) 09/12/2013  . Pleural effusion 08/18/2013  . Breast cancer of upper-inner quadrant of right female breast ([place]) 08/18/2013  . 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, [place], [place], [address] Phone: [phone]   Fax:  [phone]  Name: [name] [name] MRN: [id number] Date of Birth: [birth year]

## 2015-03-09 ENCOUNTER — Ambulatory Visit: Payer: 59 | Admitting: Physical Therapy

## 2015-03-12 ENCOUNTER — Other Ambulatory Visit: Payer: Self-pay

## 2015-03-12 ENCOUNTER — Ambulatory Visit: Payer: 59 | Admitting: Physical Therapy

## 2015-03-12 DIAGNOSIS — M25551 Pain in right hip: Secondary | ICD-10-CM

## 2015-03-12 DIAGNOSIS — I890 Lymphedema, not elsewhere classified: Secondary | ICD-10-CM

## 2015-03-12 DIAGNOSIS — M6289 Other specified disorders of muscle: Secondary | ICD-10-CM

## 2015-03-12 DIAGNOSIS — C50211 Malignant neoplasm of upper-inner quadrant of right female breast: Secondary | ICD-10-CM

## 2015-03-12 NOTE — Therapy (Signed)
[place], [place], [address] Phone: [phone]   Fax:  [phone]  Physical Therapy Treatment  Patient Details  Name: [name] [name] MRN: [id number] Date of Birth: Feb 06, 1961 Referring Provider: Dr. Lurline Del  Encounter Date: 03/12/2015      PT End of Session - 03/12/15 1244    Visit Number 101   Number of Visits 104   Date for PT Re-Evaluation 03/31/15   PT Start Time 1020   PT Stop Time 1104   PT Time Calculation (min) 44 min   Activity Tolerance Patient tolerated treatment well   Behavior During Therapy [facility] for tasks assessed/performed      Past Medical History  Diagnosis Date  . Seizures ([place]) 2010    Isolated incident.  Marland Kitchen PONV (postoperative nausea and vomiting)   . Peripheral vascular disease ([place]) 02/2010    blood clot related to porta cath  . Breast cancer ([place]) dx'd 2005/2011  . Bone metastases ([place]) dx'd 05/2014  . S/P radiation therapy 07/17/2014 through 08/02/2014     Left mediastinum, left seventh rib 3250 cGy in 13 sessions   . S/P radiation therapy 12/11/2014 through 12/22/2014     Left parietal calvarium 2400 cGy in 8 sessions     Past Surgical History  Procedure Laterality Date  . Breast lumpectomy  2005  . Axillary lymph node dissection  Dec. 2011  . Portacath placement  12/11  . Removal portacath    . Mediastinotomy chamberlain mcneil Left 06/02/2013    Procedure: MEDIASTINOTOMY CHAMBERLAIN MCNEIL;  Surgeon: [name], MD;  Location: [facility];  Service: Thoracic;  Laterality: Left;  LEFT ANTERIOR MEDIASTINOTOMY     There were no vitals filed for this visit.  Visit Diagnosis:  Pain in joint involving right pelvic region and thigh  Lymphedema  Muscle stiffness      Subjective Assessment -  03/12/15 1026    Subjective This lymphedema is not responding to the Flexitouch.   Currently in Pain? Yes   Pain Score 5    Pain Location Axilla   Pain Orientation Right                         [facility] Adult PT Treatment/Exercise - 03/12/15 0001    Manual Therapy   Myofascial Release right axilla with focus at superior scar tightness   Manual Lymphatic Drainage (MLD) In left sidelying, posterior interaxillary anastomosis and right axillo-inguinal anastomosis; in supine, short neck, left axilla and anterior interaxillary anastomosis, right groin and axillo-inguinal anastomosis, area between right breast scars, directing toward pathways.  In right sidelying, left periscapular area toward left groin.   Other Manual Therapy soft tissue work to right thigh in left sidelying, for anteriomedial, medial, and posteriomedial aspects today                  PT Short Term Goals - 02/27/15 1011    PT SHORT TERM GOAL #1   Title pain with walking decreased >/= 25%   Time 3   Period Weeks   Status Achieved   PT SHORT TERM GOAL #2   Title pelvis stays in correct alignment for 3 weeks   Time 3   Period Weeks   Status Achieved           PT Long Term Goals - 03/06/15 1023    PT LONG TERM GOAL #1   Title indpendent with HEP   Time 6  Period Weeks   Status On-going  ongoing   PT LONG TERM GOAL #2   Title pain with walking decreased >/= 80%   Time 6   Period Weeks   Status On-going  got worse   PT LONG TERM GOAL #3   Title go up and down steps with a step over step pattern   Time 6   Period Weeks   Status On-going  avoiding steps           Long Term Clinic Goals - 03/05/15 1223    CC Long Term Goal  #1   Title Reports left upper back and left side pain is controlled at 4/10 or less with therapy at the current frequency.   Status On-going   CC Long Term Goal  #2   Title Pt. will report swelling is adequately managed to enable ADL function at a  consistent level.   Status On-going   CC Long Term Goal  #3   Title Pain in area of right ischial tuberosity will be reduced to 1/10 or less, and patient will no longer need assistive device for ambulation.   Status On-going            Plan - 03/12/15 1245    Clinical Impression Statement Patient with fluid buildup at right axilla and area between to right lateral breast scars that is not responding much to her use of the Flexitouch pump.  Tissue there is softer today, but still  quite full with fulid.   Pt will benefit from skilled therapeutic intervention in order to improve on the following deficits Increased edema;Increased fascial restricitons;Pain   Rehab Potential Good   Clinical Impairments Affecting Rehab Potential active cancer   PT Frequency 2x / week   PT Duration 6 weeks   PT Treatment/Interventions Manual techniques;Manual lymph drainage   PT Next Visit Plan continue manual lymph drainage and soft tissue work for cancer rehab paln   Consulted and Agree with Plan of Care Patient        Problem List Patient Active Problem List   Diagnosis Date Noted  . Nausea with vomiting 11/18/2014  . Constipation 11/18/2014  . Left-sided thoracic back pain   . Bone metastases ([place]) 11/16/2014  . Back pain 11/15/2014  . 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 Uncontrolled pain 11/14/2014  . 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 Chest wall pain 03/21/2014  . Abnormal LFTs (liver function tests) 09/12/2013  . Pleural effusion 08/18/2013  . Breast cancer of upper-inner quadrant of right female breast ([place]) 08/18/2013  . 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

## 2015-03-13 ENCOUNTER — Other Ambulatory Visit: Payer: 59

## 2015-03-13 ENCOUNTER — Other Ambulatory Visit: Payer: Self-pay | Admitting: Oncology

## 2015-03-13 ENCOUNTER — Other Ambulatory Visit (HOSPITAL_BASED_OUTPATIENT_CLINIC_OR_DEPARTMENT_OTHER): Payer: 59

## 2015-03-13 ENCOUNTER — Ambulatory Visit: Payer: 59

## 2015-03-13 ENCOUNTER — Ambulatory Visit (HOSPITAL_COMMUNITY)
Admission: RE | Admit: 2015-03-13 | Discharge: 2015-03-13 | Disposition: A | Discharge status: Home / Self Care | Payer: 59 | Source: Ambulatory Visit | Attending: Oncology | Admitting: Oncology

## 2015-03-13 DIAGNOSIS — C50019 Malignant neoplasm of nipple and areola, unspecified female breast: Secondary | ICD-10-CM

## 2015-03-13 DIAGNOSIS — C50211 Malignant neoplasm of upper-inner quadrant of right female breast: Secondary | ICD-10-CM | POA: Diagnosis not present

## 2015-03-13 DIAGNOSIS — M899 Disorder of bone, unspecified: Secondary | ICD-10-CM | POA: Diagnosis not present

## 2015-03-13 DIAGNOSIS — J986 Disorders of diaphragm: Secondary | ICD-10-CM | POA: Insufficient documentation

## 2015-03-13 LAB — COMPREHENSIVE METABOLIC PANEL
ALT: 53 U/L (ref 0–55)
AST: 37 U/L — ABNORMAL HIGH (ref 5–34)
Albumin: 3.9 g/dL (ref 3.5–5.0)
Alkaline Phosphatase: 126 U/L (ref 40–150)
Anion Gap: 12 mEq/L — ABNORMAL HIGH (ref 3–11)
BUN: 10.9 mg/dL (ref 7.0–26.0)
CO2: 24 meq/L (ref 22–29)
Calcium: 10.1 mg/dL (ref 8.4–10.4)
Chloride: 103 mEq/L (ref 98–109)
Creatinine: 0.8 mg/dL (ref 0.6–1.1)
EGFR: 87 mL/min/{1.73_m2} — AB (ref >90)
GLUCOSE: 80 mg/dL (ref 70–140)
POTASSIUM: 4.1 meq/L (ref 3.5–5.1)
SODIUM: 138 meq/L (ref 136–145)
Total Bilirubin: 0.35 mg/dL (ref 0.20–1.20)
Total Protein: 7.9 g/dL (ref 6.4–8.3)

## 2015-03-13 LAB — CBC WITH DIFFERENTIAL/PLATELET
BASO%: 0.8 % (ref 0.0–2.0)
BASOS ABS: 0.1 10*3/uL (ref 0.0–0.1)
EOS ABS: 0.2 10*3/uL (ref 0.0–0.5)
EOS%: 2.7 % (ref 0.0–7.0)
HCT: 39.1 % (ref 34.8–46.6)
HGB: 13.2 g/dL (ref 11.6–15.9)
LYMPH%: 13 % — AB (ref 14.0–49.7)
MCH: 31.2 pg (ref 25.1–34.0)
MCHC: 33.8 g/dL (ref 31.5–36.0)
MCV: 92.2 fL (ref 79.5–101.0)
MONO#: 0.8 10*3/uL (ref 0.1–0.9)
MONO%: 10.5 % (ref 0.0–14.0)
NEUT%: 73 % (ref 38.4–76.8)
NEUTROS ABS: 5.6 10*3/uL (ref 1.5–6.5)
Platelets: 393 10*3/uL (ref 145–400)
RBC: 4.24 10*6/uL (ref 3.70–5.45)
RDW: 14.7 % — ABNORMAL HIGH (ref 11.2–14.5)
WBC: 7.7 10*3/uL (ref 3.9–10.3)
lymph#: 1 10*3/uL (ref 0.9–3.3)

## 2015-03-13 MED ORDER — IOHEXOL 300 MG/ML  SOLN
75.0000 mL | Freq: Once | INTRAMUSCULAR | Status: AC | PRN
  Administered 2015-03-13: 75 mL via INTRAVENOUS

## 2015-03-14 ENCOUNTER — Telehealth: Payer: Self-pay | Admitting: *Deleted

## 2015-03-14 ENCOUNTER — Ambulatory Visit (HOSPITAL_BASED_OUTPATIENT_CLINIC_OR_DEPARTMENT_OTHER): Payer: 59

## 2015-03-14 ENCOUNTER — Encounter: Payer: Self-pay | Admitting: Physical Therapy

## 2015-03-14 ENCOUNTER — Ambulatory Visit (HOSPITAL_BASED_OUTPATIENT_CLINIC_OR_DEPARTMENT_OTHER): Payer: 59 | Admitting: Oncology

## 2015-03-14 ENCOUNTER — Other Ambulatory Visit: Payer: Self-pay | Admitting: *Deleted

## 2015-03-14 ENCOUNTER — Ambulatory Visit: Payer: 59 | Admitting: Physical Therapy

## 2015-03-14 ENCOUNTER — Telehealth: Payer: Self-pay | Admitting: Oncology

## 2015-03-14 DIAGNOSIS — I890 Lymphedema, not elsewhere classified: Secondary | ICD-10-CM

## 2015-03-14 DIAGNOSIS — C771 Secondary and unspecified malignant neoplasm of intrathoracic lymph nodes: Secondary | ICD-10-CM

## 2015-03-14 DIAGNOSIS — M25551 Pain in right hip: Secondary | ICD-10-CM

## 2015-03-14 DIAGNOSIS — R0789 Other chest pain: Secondary | ICD-10-CM | POA: Diagnosis not present

## 2015-03-14 DIAGNOSIS — C50211 Malignant neoplasm of upper-inner quadrant of right female breast: Secondary | ICD-10-CM

## 2015-03-14 DIAGNOSIS — Z5111 Encounter for antineoplastic chemotherapy: Secondary | ICD-10-CM | POA: Diagnosis not present

## 2015-03-14 DIAGNOSIS — C787 Secondary malignant neoplasm of liver and intrahepatic bile duct: Secondary | ICD-10-CM

## 2015-03-14 DIAGNOSIS — C50212 Malignant neoplasm of upper-inner quadrant of left female breast: Secondary | ICD-10-CM

## 2015-03-14 DIAGNOSIS — R1031 Right lower quadrant pain: Secondary | ICD-10-CM

## 2015-03-14 DIAGNOSIS — Z86718 Personal history of other venous thrombosis and embolism: Secondary | ICD-10-CM

## 2015-03-14 MED ORDER — FULVESTRANT 250 MG/5ML IM SOLN
500.0000 mg | Freq: Once | INTRAMUSCULAR | Status: AC
  Administered 2015-03-14: 500 mg via INTRAMUSCULAR
  Filled 2015-03-14: qty 10

## 2015-03-14 NOTE — Progress Notes (Signed)
Late entry: 03/13/15  Pt here for IV start for CT scan today; 22G used to left wrist and left for CT scan; labs drawn from PIV site. Pt visibly nervous and upset she was added to flush room instead of infusion schedule. I assured her on next appt she will be in infusion and not flush, she voiced understanding. I encouraged her to drink plenty of fluids prior to next appt, as she will be NPO for PET scan. Agrees with plan.   Pt also concerned regarding CMET hemolyzing this visit and previous visits; I explained to her that although we take extra precautions in obtaining our blood samples, and there is a possibility we may have to draw labs and stick PIV separately; pt not agreeable to this but is willing to think it over prior to next lab draw/scan.

## 2015-03-14 NOTE — Therapy (Signed)
[facility] [address] [place], [place], [address] Phone: [phone]   Fax:  [phone]  Physical Therapy Treatment  Patient Details  Name: [name] [name] MRN: [id number] Date of Birth: [birth year] Referring Provider: Dr. Lurline Del  Encounter Date: 03/14/2015      PT End of Session - 03/14/15 1229    Visit Number 102   Number of Visits 104   Date for PT Re-Evaluation 03/31/15   Authorization - Visit Number 38   Authorization - Number of Visits 104  total visits approved   PT Start Time 1230   PT Stop Time 1310   PT Time Calculation (min) 40 min   Activity Tolerance Patient tolerated treatment well   Behavior During Therapy [facility] for tasks assessed/performed      Past Medical History  Diagnosis Date  . Seizures ([place]) 2010    Isolated incident.  Marland Kitchen PONV (postoperative nausea and vomiting)   . Peripheral vascular disease ([place]) 02/2010    blood clot related to porta cath  . Breast cancer ([place]) dx'd 2005/2011  . Bone metastases ([place]) dx'd 05/2014  . S/P radiation therapy 07/17/2014 through 08/02/2014     Left mediastinum, left seventh rib 3250 cGy in 13 sessions   . S/P radiation therapy 12/11/2014 through 12/22/2014     Left parietal calvarium 2400 cGy in 8 sessions     Past Surgical History  Procedure Laterality Date  . Breast lumpectomy  2005  . Axillary lymph node dissection  Dec. 2011  . Portacath placement  12/11  . Removal portacath    . Mediastinotomy chamberlain mcneil Left 06/02/2013    Procedure: MEDIASTINOTOMY CHAMBERLAIN MCNEIL;  Surgeon: [name], MD;  Location: [facility];  Service: Thoracic;  Laterality: Left;  LEFT ANTERIOR MEDIASTINOTOMY     There were no vitals filed for this visit.  Visit Diagnosis:  Pain in joint  involving right pelvic region and thigh  Groin pain, right      Subjective Assessment - 03/14/15 1232    Subjective I think the 2 times per week is helping. Patient is not having pain in groin. after treatment I have a longer stretch with less pain.    How long can you walk comfortably? make patient limp   Patient Stated Goals walking with minimal pain   Currently in Pain? Yes   Pain Score 4    Pain Location Groin   Pain Orientation Right   Pain Descriptors / Indicators Constant;Tightness   Pain Type Acute pain   Pain Radiating Towards down to right knee   Pain Onset More than a month ago   Pain Frequency Constant   Aggravating Factors  as the day progressess   Pain Relieving Factors Massage   Effect of Pain on Daily Activities difficulty with walking   Multiple Pain Sites No                      Pelvic Floor Special Questions - 03/14/15 0001    Pelvic Floor Internal Exam Patient approves physical therapist to perform pelvic floor muscle assessment   Exam Type Vaginal           [facility] Adult PT Treatment/Exercise - 03/14/15 0001    Manual Therapy   Manual Therapy Soft tissue mobilization;Internal Pelvic Floor   Soft tissue mobilization right quad, iliotibial band, hamstring, around  right greater trochanter   Internal Pelvic Floor right obturator internist, right puborectalis, right iliococcygeus  PT Education - 03/14/15 1307    Education provided No          PT Short Term Goals - 02/27/15 1011    PT SHORT TERM GOAL #1   Title pain with walking decreased >/= 25%   Time 3   Period Weeks   Status Achieved   PT SHORT TERM GOAL #2   Title pelvis stays in correct alignment for 3 weeks   Time 3   Period Weeks   Status Achieved           PT Long Term Goals - 03/06/15 1023    PT LONG TERM GOAL #1   Title indpendent with HEP   Time 6   Period Weeks   Status On-going  ongoing   PT LONG TERM GOAL #2   Title pain with  walking decreased >/= 80%   Time 6   Period Weeks   Status On-going  got worse   PT LONG TERM GOAL #3   Title go up and down steps with a step over step pattern   Time 6   Period Weeks   Status On-going  avoiding steps           Long Term Clinic Goals - 03/05/15 1223    CC Long Term Goal  #1   Title Reports left upper back and left side pain is controlled at 4/10 or less with therapy at the current frequency.   Status On-going   CC Long Term Goal  #2   Title Pt. will report swelling is adequately managed to enable ADL function at a consistent level.   Status On-going   CC Long Term Goal  #3   Title Pain in area of right ischial tuberosity will be reduced to 1/10 or less, and patient will no longer need assistive device for ambulation.   Status On-going            Plan - 03/14/15 1308    Clinical Impression Statement Patient had trigger points in right obturator internist and levator ani.  After soft tissue work 75% decrease in pain.  Patient had tightness located in right quad and after soft tissue work no tightness. Patient limp on the right was reduced by 50% after therapy.  Patient will benefit from phsycia ltherapy to reduce pain and limp.    Pt will benefit from skilled therapeutic intervention in order to improve on the following deficits Pain;Decreased endurance;Difficulty walking;Increased fascial restricitons;Increased muscle spasms   Rehab Potential Good   Clinical Impairments Affecting Rehab Potential active cancer   PT Frequency 2x / week   PT Duration 6 weeks   PT Treatment/Interventions Manual techniques;Therapeutic exercise;Neuromuscular re-education   PT Next Visit Plan soft tissue work   PT Home Exercise Plan progress as needed   Consulted and Agree with Plan of Care Patient   PT Plan Continue manual lymph drainage, soft tissue work.        Problem List Patient Active Problem List   Diagnosis Date Noted  . Nausea with vomiting 11/18/2014  .  Constipation 11/18/2014  . Left-sided thoracic back pain   . Bone metastases ([place]) 11/16/2014  . Back pain 11/15/2014  . 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 Uncontrolled pain 11/14/2014  . 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 Chest wall pain 03/21/2014  . Abnormal LFTs (liver function tests) 09/12/2013  . Pleural effusion 08/18/2013  . Breast cancer of upper-inner quadrant of right female breast ([place])  08/18/2013  . 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, [place], [place], [address] Phone: [phone]   Fax:  [phone]  Name: [name] [name] MRN: [id number] Date of Birth: 01/02/1961

## 2015-03-14 NOTE — Telephone Encounter (Signed)
Gave patient avs report and appointments for December. [facility] will call patient re mri and bx - patient aware.

## 2015-03-14 NOTE — Progress Notes (Signed)
[place]  [phone]) [phone] [phone]) [phone]     ID: [name] [name] OB: Aug 18, 1960  MR#: [id number]  JOA#:[id number]  PCP: Pcp Not In System GYN:  [name] [name] SU:  OTHER MD: [name] [name], [name] [name], [name] [name], [name] [name], [name] [name]  CHIEF COMPLAINT: Stage IV breast cancer  CURRENT TREATMENT: Fulvestrant  BREAST CANCER HISTORY: From doctor [name] [name]'s intake note 03/20/2004:  "The patient is a very pleasant 54 year old female, without significant past medical history.  Her family history is significant for a sister who at age 78 was diagnosed with invasive ductal carcinoma.  She is a breast cancer survivor at age 9 now.  The patient states that she has never really had a screening mammogram until October 2005, when she felt that it was time for her to start having mammograms done on a yearly basis.  Therefore, on 01/26/04, she underwent a screening mammogram and an abnormality was detected in the upper outer right breast.  She, therefore, underwent spot compression views of both the right and the left breast.  The left breast revealed a well-defined mass in the upper outer left quadrant, present at the 2 o'clock position, measuring 1.8 cm, 6 cm from the nipple.  This, by ultrasound, was felt to be a simple cyst measuring 1.8 cm.  On the right breast, a spiculated mass was noted in the upper outer right quadrant.  The ultrasound revealed a shadowing irregular solid mass at the 10:30 position, 9 cm from the nipple, measuring 1.2 cm in greatest dimension, correlating with the spiculated mass seen on the mammogram.  The right axilla was negative ultrasonically.  Because of this, the patient underwent a needle biopsy of the right breast and the biopsy was positive invasive mammary carcinoma that showed features consistent with a high-grade invasive ductal carcinoma associated with desmoplastic stroma.  No in situ component was seen  and no definite lymphovascular invasion was identified.  On the core biopsy, the tumor measured about 0.8 cm.  Because of this, she was seen by Dr. [name] and the patient was taken to the [facility] on March 15, 2004.  She underwent a right breast lumpectomy with sentinel node biopsy.  The final pathology revealed an invasive ductal carcinoma, measuring 1.7 cm, grade 2 of 3.  Margins were free of tumor.  Atypical lobular hyperplasia was noted.  One sentinel node was removed which was negative for metastatic disease.  The tumor was staged at T1c, N0 MX.  It was estrogen receptor positive, progesterone receptor positive.  HER-2/neu was 2+.  FISH was negative.  All margins were free of tumor.  She is now seen in Medical Oncology for further evaluation and management of this newly diagnosed T1c, node negative, stage I, invasive ductal carcinoma of the right breast."  Her subsequent history is as detailed below  INTERVAL HISTORY: [name] [name] returns today for follow-up of her stage IV breast cancer accompanied by her husband, [name] [name]. Clinically she is very stable, tolerating fulvestrant monthly without major difficulties, and undergoing physical therapy for the ongoing problems with the right leg and right buttock area. She feels this is improving after a brief relapse.  She was just restaged with a CT of the chest and CT of the brain. The CT of the brain was negative. CT of the chest shows all the measurable areas are entirely stable, with the exception of the liver lesions. These have nearly doubled in size.   REVIEW OF SYSTEMS: Pulmonary wise  she is doing remarkably well. She has minimal cough, which is probably more due to reflux issues still left over from the earlier radiation, then from the lung itself. She does not have any pleurisy, hemoptysis, or purulent sputum. She does have pain in the right leg and right pelvis and this keeps her from walking long distances. When she does get the pain she sits and  elevates the leg and that helps. She is having physical therapy now twice weekly. That is helping. Her appetite is "okay", she still has altered taste, but very minimal nausea and no vomiting. Otherwise a detailed review of systems today was stable  PAST MEDICAL HISTORY: Past Medical History  Diagnosis Date  . Seizures (HCC) 2010    Isolated incident.  . PONV (postoperative nausea and vomiting)   . Peripheral vascular disease (HCC) 02/2010    blood clot related to porta cath  . Breast cancer (HCC) dx'd 2005/2011  . Bone metastases (HCC) dx'd 05/2014  . S/P radiation therapy 07/17/2014 through 08/02/2014     Left mediastinum, left seventh rib 3250 cGy in 13 sessions   . S/P radiation therapy 12/11/2014 through 12/22/2014     Left parietal calvarium 2400 cGy in 8 sessions     PAST SURGICAL HISTORY: Past Surgical History  Procedure Laterality Date  . Breast lumpectomy  2005  . Axillary lymph node dissection  Dec. 2011  . Portacath placement  12/11  . Removal portacath    . Mediastinotomy chamberlain mcneil Left 06/02/2013    Procedure: MEDIASTINOTOMY CHAMBERLAIN MCNEIL;  Surgeon: [name] C Hendrickson, MD;  Location: [facility] OR;  Service: Thoracic;  Laterality: Left;  LEFT ANTERIOR MEDIASTINOTOMY     FAMILY HISTORY Family History  Problem Relation Age of Onset  . COPD Mother   . Breast cancer Sister 40   The patient's father is living, 81 years old as of may 2015. He lives in [place]. The patient's mother died from complications of COPD at the age of 75. These has 2 brothers, one sister. Her sister developed breast cancer at the age of 40. She is doing well. The patient herself underwent genetic testing at [facility] in 2011 and was found to be BRCA negative  GYNECOLOGIC HISTORY:  Menarche age 10, she is GX P0. She stopped having periods with  her initial chemotherapy in 2006.  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 Her husband [name] [name] is a city manager in [place]. He has a child from a prior marriage. At home they have 2 rescue dogs, [name] [name] and [name] [name]. The patient is religious but not a church attender    ADVANCED DIRECTIVES: In place; at the 08/04/2014 visit in particular the patient was very clear, with her husband present, that she would not want any kind of feeding tubes or "other tubes" if her condition deteriorated.   HEALTH MAINTENANCE: Social History  Substance Use Topics  . Smoking status: Never Smoker   . Smokeless tobacco: Never Used  . Alcohol Use: No     Colonoscopy:  PAP:  Bone density: March 2015; mild osteopenia  Lipid panel:  Allergies  Allergen Reactions  . Decadron [Dexamethasone] Other (See Comments)    Patient does not tolerate steroids.   . Dilaudid [Hydromorphone] Nausea And Vomiting  . Fluconazole Swelling    Liver toxicity  . Hydromorphone Hcl Nausea And Vomiting  . Morphine And Related Nausea And Vomiting  . Protonix [Pantoprazole   Sodium] Other (See Comments)    Patient reports it caused thrush.  . Tegaderm Ag Mesh [Silver]     Current Outpatient Prescriptions  Medication Sig Dispense Refill  . ALPRAZolam (XANAX) 0.5 MG tablet Take 1 tablet (0.5 mg total) by mouth 2 (two) times daily as needed for anxiety. (Patient taking differently: Take 0.125 mg by mouth 2 (two) times daily as needed for anxiety. ) 30 tablet 0  . B Complex-C (B-COMPLEX WITH VITAMIN C) tablet Take 1 tablet by mouth daily.    . cholecalciferol 2000 UNITS tablet Take 1 tablet (2,000 Units total) by mouth daily.    . folic acid (FOLVITE) 1 MG tablet Take 1 tablet (1 mg total) by mouth daily.    . ibuprofen (ADVIL,MOTRIN) 200 MG tablet Take 400 mg by mouth every 4 (four) hours as needed for moderate pain.     . Melatonin 3 MG TABS Take 3 mg by  mouth at bedtime.     No current facility-administered medications for this visit.   Facility-Administered Medications Ordered in Other Visits  Medication Dose Route Frequency Provider Last Rate Last Dose  . fulvestrant (FASLODEX) injection 500 mg  500 mg Intramuscular Once Gustav C Magrinat, MD        OBJECTIVE: Middle-aged white woman who appears stated age There were no vitals filed for this visit.   There is no weight on file to calculate BMI.   There were no vitals filed for this visit.    Patient refused vitals today (03/14/2015    ECOG FS:2 - Symptomatic, <50% confined to bed  Sclerae unicteric, pupils round and equal Oropharynx clear and moist-- no thrush or other lesions No cervical or supraclavicular adenopathy Lungs no rales or rhonchi, decreased breath sounds left base Heart regular rate and rhythm Abd soft, nontender, positive bowel sounds, no hepatomegaly by palpation MSK no focal spinal tenderness, no upper extremity lymphedema Neuro: nonfocal, well oriented, appropriate affect Breasts: Deferred     LAB RESULTS:   CMP     Component Value Date/Time   NA 138 03/13/2015 1230   NA 140 12/14/2014 0800   K 4.1 03/13/2015 1230   K 3.6 12/14/2014 0800   CL 103 12/14/2014 0800   CL 105 05/06/2012 1333   CO2 24 03/13/2015 1230   CO2 28 12/14/2014 0800   GLUCOSE 80 03/13/2015 1230   GLUCOSE 100* 12/14/2014 0800   GLUCOSE 124* 05/06/2012 1333   BUN 10.9 03/13/2015 1230   BUN 6 12/14/2014 0800   CREATININE 0.8 03/13/2015 1230   CREATININE 0.64 12/14/2014 0800   CALCIUM 10.1 03/13/2015 1230   CALCIUM 9.6 12/14/2014 0800   PROT 7.9 03/13/2015 1230   PROT 5.6* 11/20/2014 0433   ALBUMIN 3.9 03/13/2015 1230   ALBUMIN 2.6* 11/20/2014 0433   AST 37* 03/13/2015 1230   AST 28 11/20/2014 0433   ALT 53 03/13/2015 1230   ALT 62* 11/20/2014 0433   ALKPHOS 126 03/13/2015 1230   ALKPHOS 105 11/20/2014 0433   BILITOT 0.35 03/13/2015 1230   BILITOT 0.7 11/20/2014 0433    GFRNONAA >60 12/14/2014 0800   GFRAA >60 12/14/2014 0800    No results found for: SPEP  Lab Results  Component Value Date   WBC 7.7 03/13/2015   NEUTROABS 5.6 03/13/2015   HGB 13.2 03/13/2015   HCT 39.1 03/13/2015   MCV 92.2 03/13/2015   PLT 393 03/13/2015      Chemistry      Component Value Date/Time   NA   138 03/13/2015 1230   NA 140 12/14/2014 0800   K 4.1 03/13/2015 1230   K 3.6 12/14/2014 0800   CL 103 12/14/2014 0800   CL 105 05/06/2012 1333   CO2 24 03/13/2015 1230   CO2 28 12/14/2014 0800   BUN 10.9 03/13/2015 1230   BUN 6 12/14/2014 0800   CREATININE 0.8 03/13/2015 1230   CREATININE 0.64 12/14/2014 0800      Component Value Date/Time   CALCIUM 10.1 03/13/2015 1230   CALCIUM 9.6 12/14/2014 0800   ALKPHOS 126 03/13/2015 1230   ALKPHOS 105 11/20/2014 0433   AST 37* 03/13/2015 1230   AST 28 11/20/2014 0433   ALT 53 03/13/2015 1230   ALT 62* 11/20/2014 0433   BILITOT 0.35 03/13/2015 1230   BILITOT 0.7 11/20/2014 0433       Lab Results  Component Value Date   LABCA2 25 11/02/2007    No components found for: LABCA125  No results for input(s): INR in the last 168 hours.  Urinalysis    Component Value Date/Time   COLORURINE YELLOW 11/17/2014 0143   APPEARANCEUR CLOUDY* 11/17/2014 0143   LABSPEC 1.010 11/17/2014 0143   LABSPEC 1.005 09/12/2013 1542   PHURINE 6.5 11/17/2014 0143   PHURINE 6.0 09/12/2013 1542   GLUCOSEU NEGATIVE 11/17/2014 0143   GLUCOSEU Negative 09/12/2013 1542   HGBUR NEGATIVE 11/17/2014 0143   HGBUR Negative 09/12/2013 1542   BILIRUBINUR NEGATIVE 11/17/2014 0143   BILIRUBINUR Negative 09/12/2013 1542   KETONESUR NEGATIVE 11/17/2014 0143   KETONESUR Negative 09/12/2013 1542   PROTEINUR NEGATIVE 11/17/2014 0143   PROTEINUR Negative 09/12/2013 1542   UROBILINOGEN 0.2 11/17/2014 0143   UROBILINOGEN 0.2 09/12/2013 1542   NITRITE NEGATIVE 11/17/2014 0143   NITRITE Negative 09/12/2013 1542   LEUKOCYTESUR NEGATIVE 11/17/2014  0143   LEUKOCYTESUR Negative 09/12/2013 1542    STUDIES: Ct Head W Wo Contrast  03/13/2015  CLINICAL DATA:  Stage IV breast cancer EXAM: CT HEAD WITHOUT AND WITH CONTRAST TECHNIQUE: Contiguous axial images were obtained from the base of the skull through the vertex without and with intravenous contrast CONTRAST:  75 mL Omnipaque 300 IV E COMPARISON:  MRI 10/18/2014 FINDINGS: Ventricle size normal.  Cerebral volume normal. Negative for acute or chronic infarction. Negative for intracranial mass or edema.  Negative for hemorrhage. Postcontrast imaging of the brain reveals no enhancing lesion. Vascular enhancement normal. Left parietal skull lesion measures 20 mm and is unchanged from the MRI of 10/18/2014. This enhances on MRI and is compatible with metastatic disease. This was not present in 2012. Right frontal lesion over the convexity measures 11 mm was not present on the recent MRI. This also is most likely due to metastatic disease. IMPRESSION: Negative for metastatic disease to the brain 20 mm left parietal bone lesion. This is unchanged. New 11 mm right frontal bone lesion consistent with metastatic disease. Electronically Signed   By: Charles  Clark M.D.   On: 03/13/2015 15:14   Ct Chest W Contrast  03/13/2015  CLINICAL DATA:  Stage IV breast cancer. Subsequent treatment evaluation EXAM: CT CHEST WITH CONTRAST TECHNIQUE: Multidetector CT imaging of the chest was performed during intravenous contrast administration. CONTRAST:  75mL OMNIPAQUE IOHEXOL 300 MG/ML  SOLN COMPARISON:  CT 12/26/2014, PET-CT 10/30/2014 FINDINGS: Mediastinum/Nodes: Enlarged lymph node in the medial LEFT axilla measures 11 mm (image 14, series 4) compared to 11 mm. No supraclavicular adenopathy. Small paratracheal lymph nodes are unchanged measuring up to 8 mm. Subcarinal node measures 8 mm unchanged. No   central pulmonary embolism. Lungs/Pleura: There is volume loss in the LEFT hemi thorax. There is consolidation with air  bronchograms and bronchiectasis in the medial LEFT upper lobe not changed. Focus of round atelectasis at the LEFT lung base is unchanged. No new pulmonary nodularity. There is nodularity within the pleural space inferiorly in the LEFT hemi thorax similar to comparison exam (Image 38, series 4). There is metastatic lymph nodes in the precordial space adjacent to the LEFT ventricle. For example 12 mm node on image 37, series 4 compares to 12 mm. Upper abdomen: Progression of liver lesions. Lesion in the RIGHT hepatic lobe is increased in size with peripheral enhancement measuring 17 mm (image 47, series 4) compared to 9 mm on prior remeasured. More centrally in the RIGHT hepatic lobe 11 mm lesion on image 53 compares to 7 mm. Lesions central LEFT hepatic lobe on image 50 measures 15 mm not changed. There is thickening of the LEFT crus of the diaphragm to 2.5 cm not changed from prior. Musculoskeletal: Paraspinal lesion at T11 measures 16 mm (image 43, series 4 compared to 17 mm. Sclerotic lesion at T1 similar to comparison exam. Sclerotic lesion in the medial LEFT clavicle is unchanged. IMPRESSION: 1. Interval enlargement of hepatic metastasis. 2. Essentially stable pleural disease in the LEFT hemi thorax with extension to the precordial space. 3. Stable paravertebral lesion adjacent to the T11 LEFT neural foramina. 4. Stable thickening at the LEFT crus of the diaphragm consistent metastatic deposit. Electronically Signed   By: Suzy Bouchard M.D.   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 Stage IA invasive ductal carcinoma, grade 2, estrogen receptor 95% positive, progesterone receptor 65% positive, HER-2 not amplified; additional surgery 04/25/2004 for seroma or clearance showed no residual tumor  (2) adjuvant chemotherapy with cyclophosphamide and  doxorubicin every 21 days x4 completed 07/19/2004  (3) adjuvant radiation given under Dr. Donella Stade in [place] completed July 2006  (4) the patient opted against adjuvant antiestrogen therapy  (5) genetics testing showed no BRCA mutations  (6) biopsy of a palpable right axillary mass 10/24/2009 showed invasive ductal carcinoma, grade 3, estrogen receptor 100% positive, progesterone receptor 2% positive (alert score 5) HER-2 negative; no evidence of systemic disease on PET scanning  (7) completed 3 of 4 planned cycles of docetaxel and cyclophosphamide September 2011, fourth cycle omitted because of marked elevations in liver function tests  (8) an right axillary lymph node dissection 03/06/2010 showed 3/8 lymph nodes removed to be involved by tumor, with extracapsular extension.  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, TTS-1 negative), estrogen receptor 80% positive, progesterone receptor 2% positive, HER-2 not amplified  (12) letrozole started March 2015-- tolerated with significant side effects, discontinued at the end of May 2015  (13) PET scan 08/16/2013 shows extensive left pleural metastatic disease and a large left pleural effusion that shifts cardiac and mediastinal structures to the right; adenopathy (celiac trunk, periadrenal, periaortic); and a left medial clavicular lesion; Status post left thoracentesis 08/16/2013 positive for adenocarcinoma, estrogen  receptor positive, progesterone receptor negative.  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 [PIK3CA H1047R, a complex rearrangement in PIK3R2, and amplification ofPIK3C2B]],  amplification of MCL1 and MDM4, anda MAP2K4 R287H mutation; everolimus was suggested as an available targeted agent  (18) exemestane started 03/31/2014, discontinued 10/31/2014 with evidence of progression  (a) everolimus added 04/03/2014 but not tolerated (cytopenias, elevated LFTs) even at minimal doses; stopped 04/17/2014  (19) fulvestrant started 12/20/2014    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 treated with Lovenox for 5-6 months  (c) right upper extremity lymphedema--receiving physical therapy  (d) hepatic steatosis with chronically elevated LFTs as well as unusual hepatic sensitivity to chemotherapy  (e) osteopenia with the lowest T score -1.6 on bone density scan 06/20/2013  (f) radiation oncology (Dr Murray) has reviewed prior radiation records in case there is further mediastinal involvement with dysphagia etc in which case palliative XRT could be considered  (a) radiation to left mediastinum/ left 7th rib 3250 cGy in 13 sessions04/18/2016 through 08/02/2014  (b) radiation to T11 area: 22 Gy in 7 sessions, last dose 11/27/2014  (c) radiation left parietal scalp region to be completed 12/22/2014  (g) chest wall pain--improved post radiation treatments   PLAN: [name] [name]'s breast cancer is completely stable with fulvestrant treatment, except in the liver. We discussed possible reasons for this. The reasons of most concern would be if the metastasis to the liver had become triple  negative or had become HER-2 positive. [place] to find out is to do a liver biopsy. We discussed the procedure also in detail today and she is willing and eager to have it done.  If we can get it scheduled in the next 48 hours we will have the results of the prognostic panel early next week. Otherwise we will have to wait a week to get those results.  Assuming there has been no change in estrogen receptors and HER-2 amplification, then the question is what to do next. The simplest thing to do is to add the Ibrance. We would start at very low doses given her problems with tolerance and try to optimize the 75 mg dose to at least a few times a week with close follow-up. We would then repeat staging studies after 2-3 months. To facilitate that a liver MRI is the very best test and I am trying to get that accomplished in the next couple of weeks as well.  I don't think I'm going to have all the results by 03/20/2015 so I am moving her appointment with me to 03/23/2015. Hopefully at that point we can make a definitive plan.  [name] [name] has a good understanding of this and is in agreement with proceeding. She will need to be accessed here, prior to any procedure with contrast, and she also requests that the sedation used for Pleurx removal be used again. I have let interventional radiology know of that request.   She knows to call for any problems that may develop before the next visit here.   MAGRINAT,GUSTAV C, MD   03/14/2015 9:12 AM 

## 2015-03-15 ENCOUNTER — Ambulatory Visit: Payer: 59 | Admitting: Physical Therapy

## 2015-03-15 ENCOUNTER — Encounter: Payer: Self-pay | Admitting: Physical Therapy

## 2015-03-15 DIAGNOSIS — M25551 Pain in right hip: Secondary | ICD-10-CM

## 2015-03-15 DIAGNOSIS — R1031 Right lower quadrant pain: Secondary | ICD-10-CM

## 2015-03-15 NOTE — Therapy (Signed)
[facility] [address] [place], [place], [address] Phone: [phone]   Fax:  [phone]  Physical Therapy Treatment  Patient Details  Name: [name] [name] MRN: [id number] Date of Birth: [birth year] Referring Provider: Dr. Lurline Del  Encounter Date: 03/15/2015      PT End of Session - 03/15/15 1402    Visit Number 104   Number of Visits 104   Date for PT Re-Evaluation 03/31/15   Authorization - Visit Number 104   Authorization - Number of Visits 104  all appts.   PT Start Time 1400   PT Stop Time 1440   PT Time Calculation (min) 40 min   Activity Tolerance Patient tolerated treatment well   Behavior During Therapy WFL for tasks assessed/performed      Past Medical History  Diagnosis Date  . Seizures ([place]) 2010    Isolated incident.  Marland Kitchen PONV (postoperative nausea and vomiting)   . Peripheral vascular disease ([place]) 02/2010    blood clot related to porta cath  . Breast cancer ([place]) dx'd 2005/2011  . Bone metastases ([place]) dx'd 05/2014  . S/P radiation therapy 07/17/2014 through 08/02/2014     Left mediastinum, left seventh rib 3250 cGy in 13 sessions   . S/P radiation therapy 12/11/2014 through 12/22/2014     Left parietal calvarium 2400 cGy in 8 sessions     Past Surgical History  Procedure Laterality Date  . Breast lumpectomy  2005  . Axillary lymph node dissection  Dec. 2011  . Portacath placement  12/11  . Removal portacath    . Mediastinotomy chamberlain mcneil Left 06/02/2013    Procedure: MEDIASTINOTOMY CHAMBERLAIN MCNEIL;  Surgeon: [name], MD;  Location: [facility];  Service: Thoracic;  Laterality: Left;  LEFT ANTERIOR MEDIASTINOTOMY     There were no vitals filed for this visit.  Visit Diagnosis:  Pain in joint involving  right pelvic region and thigh  Groin pain, right      Subjective Assessment - 03/15/15 1403    Subjective I felt good all day yesterday until I moved to get into bed and felt a twinge.  This morning the muscles felt kinked up. I do not have the throbbing. i am limping today. I have to assist my right leg into the bed again.    Pertinent History patient vomited just prior to treatment today   How long can you walk comfortably? make patient limp   Patient Stated Goals walking with minimal pain   Currently in Pain? Yes   Pain Score 5    Pain Location Groin   Pain Orientation Right   Pain Descriptors / Indicators Constant   Pain Type Acute pain   Pain Radiating Towards down right knee   Pain Onset More than a month ago   Pain Frequency Constant   Aggravating Factors  as the day progresses   Pain Relieving Factors massage   Effect of Pain on Daily Activities difficulty with walking   Multiple Pain Sites No   Multiple Pain Sites No                         [facility] Adult PT Treatment/Exercise - 03/15/15 0001    Manual Therapy   Manual Therapy Soft tissue mobilization   Soft tissue mobilization right quad, iliotibial band, hamstring, around  right greater trochanter, right buttocks, right iliotibial band   Internal Pelvic Floor right obturator internist, right puborectalis, right  iliococcygeus                PT Education - 03/15/15 1437    Education provided No          PT Short Term Goals - 02/27/15 1011    PT SHORT TERM GOAL #1   Title pain with walking decreased >/= 25%   Time 3   Period Weeks   Status Achieved   PT SHORT TERM GOAL #2   Title pelvis stays in correct alignment for 3 weeks   Time 3   Period Weeks   Status Achieved           PT Long Term Goals - 03/06/15 1023    PT LONG TERM GOAL #1   Title indpendent with HEP   Time 6   Period Weeks   Status On-going  ongoing   PT LONG TERM GOAL #2   Title pain with walking decreased  >/= 80%   Time 6   Period Weeks   Status On-going  got worse   PT LONG TERM GOAL #3   Title go up and down steps with a step over step pattern   Time 6   Period Weeks   Status On-going  avoiding steps           Long Term Clinic Goals - 03/05/15 1223    CC Long Term Goal  #1   Title Reports left upper back and left side pain is controlled at 4/10 or less with therapy at the current frequency.   Status On-going   CC Long Term Goal  #2   Title Pt. will report swelling is adequately managed to enable ADL function at a consistent level.   Status On-going   CC Long Term Goal  #3   Title Pain in area of right ischial tuberosity will be reduced to 1/10 or less, and patient will no longer need assistive device for ambulation.   Status On-going            Plan - 03/15/15 1437    Clinical Impression Statement Patient had less trigger points in levator ani and obturator internist.  N otrigger points in right hip aductors.  The right iliotibial band is vert tight. Patient was able to place right le on the bed without help aftr the treatment. Patient will bnefit from physical therapy to reduce pain.    Pt will benefit from skilled therapeutic intervention in order to improve on the following deficits Pain;Decreased endurance;Difficulty walking;Increased fascial restricitons;Increased muscle spasms   Rehab Potential Good   Clinical Impairments Affecting Rehab Potential active cancer   PT Frequency 2x / week   PT Duration 6 weeks   PT Treatment/Interventions Manual techniques;Therapeutic exercise;Neuromuscular re-education   PT Next Visit Plan soft tissue work; write renewal   PT Home Exercise Plan progress as needed   Consulted and Agree with Plan of Care Patient   PT Plan Continue manual lymph drainage, soft tissue work.        Problem List Patient Active Problem List   Diagnosis Date Noted  . Nausea with vomiting 11/18/2014  . Constipation 11/18/2014  . Left-sided thoracic  back pain   . Bone metastases ([place]) 11/16/2014  . Back pain 11/15/2014  . 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 Uncontrolled pain 11/14/2014  . 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 Chest wall pain 03/21/2014  . Abnormal LFTs (liver function tests) 09/12/2013  . Pleural effusion 08/18/2013  .  Breast cancer of upper-inner quadrant of right female breast ([place]) 08/18/2013  . 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 [place], [place], [address] Phone: [phone]   Fax:  [phone]  Name: SINDEL LEY MRN: [id number] Date of Birth: Apr 19, 1960

## 2015-03-16 ENCOUNTER — Encounter: Payer: 59 | Admitting: Physical Therapy

## 2015-03-16 ENCOUNTER — Other Ambulatory Visit: Payer: Self-pay | Admitting: *Deleted

## 2015-03-16 MED ORDER — OSELTAMIVIR PHOSPHATE 75 MG PO CAPS: 75.0000 mg | ORAL_CAPSULE | Freq: Two times a day (BID) | ORAL | Status: DC

## 2015-03-19 ENCOUNTER — Telehealth: Payer: Self-pay

## 2015-03-19 ENCOUNTER — Ambulatory Visit: Payer: 59 | Admitting: Physical Therapy

## 2015-03-19 ENCOUNTER — Other Ambulatory Visit: Payer: Self-pay | Admitting: Radiology

## 2015-03-19 ENCOUNTER — Other Ambulatory Visit: Payer: Self-pay

## 2015-03-19 ENCOUNTER — Telehealth: Payer: Self-pay | Admitting: Oncology

## 2015-03-19 ENCOUNTER — Encounter: Payer: Self-pay | Admitting: Physical Therapy

## 2015-03-19 ENCOUNTER — Ambulatory Visit (HOSPITAL_COMMUNITY)
Admission: RE | Admit: 2015-03-19 | Discharge: 2015-03-19 | Disposition: A | Discharge status: Home / Self Care | Payer: 59 | Source: Ambulatory Visit | Attending: Oncology | Admitting: Oncology

## 2015-03-19 ENCOUNTER — Ambulatory Visit (HOSPITAL_BASED_OUTPATIENT_CLINIC_OR_DEPARTMENT_OTHER): Payer: Self-pay

## 2015-03-19 DIAGNOSIS — C772 Secondary and unspecified malignant neoplasm of intra-abdominal lymph nodes: Secondary | ICD-10-CM | POA: Insufficient documentation

## 2015-03-19 DIAGNOSIS — I739 Peripheral vascular disease, unspecified: Secondary | ICD-10-CM | POA: Diagnosis not present

## 2015-03-19 DIAGNOSIS — Z79899 Other long term (current) drug therapy: Secondary | ICD-10-CM | POA: Insufficient documentation

## 2015-03-19 DIAGNOSIS — M25551 Pain in right hip: Secondary | ICD-10-CM

## 2015-03-19 DIAGNOSIS — C7801 Secondary malignant neoplasm of right lung: Secondary | ICD-10-CM | POA: Diagnosis not present

## 2015-03-19 DIAGNOSIS — Z803 Family history of malignant neoplasm of breast: Secondary | ICD-10-CM | POA: Diagnosis not present

## 2015-03-19 DIAGNOSIS — I890 Lymphedema, not elsewhere classified: Secondary | ICD-10-CM | POA: Diagnosis not present

## 2015-03-19 DIAGNOSIS — C771 Secondary and unspecified malignant neoplasm of intrathoracic lymph nodes: Secondary | ICD-10-CM

## 2015-03-19 DIAGNOSIS — Z885 Allergy status to narcotic agent status: Secondary | ICD-10-CM | POA: Diagnosis not present

## 2015-03-19 DIAGNOSIS — R7989 Other specified abnormal findings of blood chemistry: Secondary | ICD-10-CM

## 2015-03-19 DIAGNOSIS — C7989 Secondary malignant neoplasm of other specified sites: Secondary | ICD-10-CM | POA: Insufficient documentation

## 2015-03-19 DIAGNOSIS — C50211 Malignant neoplasm of upper-inner quadrant of right female breast: Secondary | ICD-10-CM

## 2015-03-19 DIAGNOSIS — R945 Abnormal results of liver function studies: Secondary | ICD-10-CM

## 2015-03-19 DIAGNOSIS — Z923 Personal history of irradiation: Secondary | ICD-10-CM | POA: Insufficient documentation

## 2015-03-19 DIAGNOSIS — Z853 Personal history of malignant neoplasm of breast: Secondary | ICD-10-CM | POA: Diagnosis not present

## 2015-03-19 DIAGNOSIS — M6289 Other specified disorders of muscle: Secondary | ICD-10-CM

## 2015-03-19 DIAGNOSIS — R1031 Right lower quadrant pain: Secondary | ICD-10-CM

## 2015-03-19 DIAGNOSIS — C7951 Secondary malignant neoplasm of bone: Secondary | ICD-10-CM | POA: Insufficient documentation

## 2015-03-19 DIAGNOSIS — K769 Liver disease, unspecified: Secondary | ICD-10-CM | POA: Diagnosis present

## 2015-03-19 DIAGNOSIS — C787 Secondary malignant neoplasm of liver and intrahepatic bile duct: Secondary | ICD-10-CM | POA: Insufficient documentation

## 2015-03-19 DIAGNOSIS — J90 Pleural effusion, not elsewhere classified: Secondary | ICD-10-CM

## 2015-03-19 LAB — GLUCOSE, CAPILLARY: Glucose-Capillary: 79 mg/dL (ref 65–99)

## 2015-03-19 MED ORDER — FLUDEOXYGLUCOSE F - 18 (FDG) INJECTION
10.1000 | Freq: Once | INTRAVENOUS | Status: AC | PRN
  Administered 2015-03-19: 10.1 via INTRAVENOUS

## 2015-03-19 NOTE — Progress Notes (Signed)
Pt to infusion room for IV start for Pet Scan today.  Pt anxious.  NSL started to left forearm without difficulty per A. Quentin Cornwall RN.

## 2015-03-19 NOTE — Telephone Encounter (Signed)
No entry 

## 2015-03-19 NOTE — Telephone Encounter (Signed)
per pof to sch pt appt-cld & spoke to pt and gave pt time & date of 12/22 access appt @ 8:30-pt understoo

## 2015-03-19 NOTE — Telephone Encounter (Signed)
Patient is requesting an appt on 03/22/15 in infusion for IV access for an MRI.  Spoke with [employer] and placed POF.  Patient is due in today for the same thing.

## 2015-03-19 NOTE — Therapy (Signed)
[place], [place], [address] Phone: [phone]   Fax:  [phone]  Physical Therapy Treatment  Patient Details  Name: [name] [name] MRN: [id number] Date of Birth: [birth year] Referring Provider: Dr. Lurline Del  Encounter Date: 03/19/2015      PT End of Session - 03/19/15 1214    Visit Number 105   Number of Visits 104   Date for PT Re-Evaluation 03/31/15   PT Start Time 1100   PT Stop Time 1145   PT Time Calculation (min) 45 min      Past Medical History  Diagnosis Date  . Seizures ([place]) 2010    Isolated incident.  Marland Kitchen PONV (postoperative nausea and vomiting)   . Peripheral vascular disease ([place]) 02/2010    blood clot related to porta cath  . Breast cancer ([place]) dx'd 2005/2011  . Bone metastases ([place]) dx'd 05/2014  . S/P radiation therapy 07/17/2014 through 08/02/2014     Left mediastinum, left seventh rib 3250 cGy in 13 sessions   . S/P radiation therapy 12/11/2014 through 12/22/2014     Left parietal calvarium 2400 cGy in 8 sessions     Past Surgical History  Procedure Laterality Date  . Breast lumpectomy  2005  . Axillary lymph node dissection  Dec. 2011  . Portacath placement  12/11  . Removal portacath    . Mediastinotomy chamberlain mcneil Left 06/02/2013    Procedure: MEDIASTINOTOMY CHAMBERLAIN MCNEIL;  Surgeon: [name], MD;  Location: [facility];  Service: Thoracic;  Laterality: Left;  LEFT ANTERIOR MEDIASTINOTOMY     There were no vitals filed for this visit.  Visit Diagnosis:  Pain in joint involving right pelvic region and thigh  Groin pain, right  Lymphedema  Muscle stiffness      Subjective Assessment - 03/19/15 1209    Subjective Pt says she has had more swelling in left axilla since she had a  nerve ending flare up.  She continues to have tightness in right leg, but she says that overall she is doing better with that .  She has multiple testing procedures this week    Patient Stated Goals walking with minimal pain and to get relief from lymphedema at axilla and anterior chest    Currently in Pain? Yes   Pain Score 5                          [facility] Adult PT Treatment/Exercise - 03/19/15 0001    Manual Therapy   Manual Therapy Soft tissue mobilization;Manual Lymphatic Drainage (MLD)   Myofascial Release right axilla with focus at superior scar tightness   Manual Lymphatic Drainage (MLD) In left sidelying, posterior interaxillary anastomosis and right axillo-inguinal anastomosis; in supine, short neck, left axilla and anterior interaxillary anastomosis, right groin and axillo-inguinal anastomosis, area between right breast scars, directing toward pathways.  In right sidelying, left periscapular area toward left groin.   Other Manual Therapy soft tissue work to right thigh in left sidelying, for anteriomedial, medial, and posteriomedial aspects today                  PT Short Term Goals - 02/27/15 1011    PT SHORT TERM GOAL #1   Title pain with walking decreased >/= 25%   Time 3   Period Weeks   Status Achieved   PT SHORT TERM GOAL #2   Title pelvis stays in correct alignment for 3  weeks   Time 3   Period Weeks   Status Achieved           PT Long Term Goals - 03/06/15 1023    PT LONG TERM GOAL #1   Title indpendent with HEP   Time 6   Period Weeks   Status On-going  ongoing   PT LONG TERM GOAL #2   Title pain with walking decreased >/= 80%   Time 6   Period Weeks   Status On-going  got worse   PT LONG TERM GOAL #3   Title go up and down steps with a step over step pattern   Time 6   Period Weeks   Status On-going  avoiding steps           Long Term Clinic Goals - 03/05/15 1223    CC Long Term Goal  #1   Title Reports left  upper back and left side pain is controlled at 4/10 or less with therapy at the current frequency.   Status On-going   CC Long Term Goal  #2   Title Pt. will report swelling is adequately managed to enable ADL function at a consistent level.   Status On-going   CC Long Term Goal  #3   Title Pain in area of right ischial tuberosity will be reduced to 1/10 or less, and patient will no longer need assistive device for ambulation.   Status On-going            Plan - 03/19/15 1215    Clinical Impression Statement Pt having exacerbation of fullness at right axilla and anterior chest.  she received relief with manual lymph drainage and aslo reported less pain in right leg after soft tissue work to this area    Clinical Impairments Affecting Rehab Potential active cancer   PT Plan Continue manual lymph drainage, soft tissue work.        Problem List Patient Active Problem List   Diagnosis Date Noted  . Nausea with vomiting 11/18/2014  . Constipation 11/18/2014  . Left-sided thoracic back pain   . Bone metastases ([place]) 11/16/2014  . Back pain 11/15/2014  . 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 Uncontrolled pain 11/14/2014  . 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 Chest wall pain 03/21/2014  . Abnormal LFTs (liver function tests) 09/12/2013  . Pleural effusion 08/18/2013  . Breast cancer of upper-inner quadrant of right female breast ([place]) 08/18/2013  . 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 Owens Shark, PT  03/19/2015, 12:17 PM  [place], [place], [address] Phone: [phone]   Fax:  [phone]  Name: BETTYE AMMAN MRN: [id number] Date of Birth: 08/27/1960

## 2015-03-20 ENCOUNTER — Ambulatory Visit: Payer: 59 | Admitting: Oncology

## 2015-03-20 ENCOUNTER — Ambulatory Visit (HOSPITAL_COMMUNITY)
Admission: RE | Admit: 2015-03-20 | Discharge: 2015-03-20 | Disposition: A | Discharge status: Home / Self Care | Payer: 59 | Source: Ambulatory Visit | Attending: Oncology | Admitting: Oncology

## 2015-03-20 ENCOUNTER — Encounter (HOSPITAL_COMMUNITY): Payer: Self-pay

## 2015-03-20 DIAGNOSIS — C50212 Malignant neoplasm of upper-inner quadrant of left female breast: Secondary | ICD-10-CM

## 2015-03-20 DIAGNOSIS — C787 Secondary malignant neoplasm of liver and intrahepatic bile duct: Secondary | ICD-10-CM | POA: Diagnosis not present

## 2015-03-20 LAB — PROTIME-INR
INR: 1 (ref 0.00–1.49)
Prothrombin Time: 13.4 seconds (ref 11.6–15.2)

## 2015-03-20 LAB — CBC
HCT: 39.3 % (ref 36.0–46.0)
Hemoglobin: 13.3 g/dL (ref 12.0–15.0)
MCH: 31.7 pg (ref 26.0–34.0)
MCHC: 33.8 g/dL (ref 30.0–36.0)
MCV: 93.6 fL (ref 78.0–100.0)
PLATELETS: 396 10*3/uL (ref 150–400)
RBC: 4.2 MIL/uL (ref 3.87–5.11)
RDW: 13.8 % (ref 11.5–15.5)
WBC: 6.6 10*3/uL (ref 4.0–10.5)

## 2015-03-20 LAB — APTT: aPTT: 27 seconds (ref 24–37)

## 2015-03-20 MED ORDER — FENTANYL CITRATE (PF) 100 MCG/2ML IJ SOLN
INTRAMUSCULAR | Status: AC
  Filled 2015-03-20: qty 4

## 2015-03-20 MED ORDER — FENTANYL CITRATE (PF) 100 MCG/2ML IJ SOLN
INTRAMUSCULAR | Status: AC | PRN
  Administered 2015-03-20: 25 ug via INTRAVENOUS
  Administered 2015-03-20: 75 ug via INTRAVENOUS

## 2015-03-20 MED ORDER — SODIUM CHLORIDE 0.9 % IV SOLN
INTRAVENOUS | Status: DC
  Administered 2015-03-20: 11:00:00 via INTRAVENOUS

## 2015-03-20 MED ORDER — FLUMAZENIL 0.5 MG/5ML IV SOLN
INTRAVENOUS | Status: AC
  Filled 2015-03-20: qty 5

## 2015-03-20 MED ORDER — NALOXONE HCL 0.4 MG/ML IJ SOLN
INTRAMUSCULAR | Status: AC
  Filled 2015-03-20: qty 1

## 2015-03-20 MED ORDER — MIDAZOLAM HCL 2 MG/2ML IJ SOLN
INTRAMUSCULAR | Status: AC
  Filled 2015-03-20: qty 6

## 2015-03-20 MED ORDER — MIDAZOLAM HCL 2 MG/2ML IJ SOLN
INTRAMUSCULAR | Status: AC | PRN
  Administered 2015-03-20: 2 mg via INTRAVENOUS
  Administered 2015-03-20: 1 mg via INTRAVENOUS
  Administered 2015-03-20: 0.5 mg via INTRAVENOUS

## 2015-03-20 NOTE — Discharge Instructions (Signed)
Liver Biopsy, Care After °Refer to this sheet in the next few weeks. These instructions provide you with information on caring for yourself after your procedure. Your health care provider may also give you more specific instructions. Your treatment has been planned according to current medical practices, but problems sometimes occur. Call your health care provider if you have any problems or questions after your procedure. °WHAT TO EXPECT AFTER THE PROCEDURE °After your procedure, it is typical to have the following: °· A small amount of discomfort in the area where the biopsy was done and in the right shoulder or shoulder blade. °· A small amount of bruising around the area where the biopsy was done and on the skin over the liver. °· Sleepiness and fatigue for the rest of the day. °HOME CARE INSTRUCTIONS  °· Rest at home for 1-2 days or as directed by your health care provider. °· Have a friend or family member stay with you for at least 24 hours. °· Because of the medicines used during the procedure, you should not do the following things in the first 24 hours: °¨ Drive. °¨ Use machinery. °¨ Be responsible for the care of other people. °¨ Sign legal documents. °¨ Take a bath or shower. °· There are many different ways to close and cover an incision, including stitches, skin glue, and adhesive strips. Follow your health care provider's instructions on: °¨ Incision care. °¨ Bandage (dressing) changes and removal. °¨ Incision closure removal. °· Do not drink alcohol in the first week. °· Do not lift more than 5 pounds or play contact sports for 2 weeks after this test. °· Take medicines only as directed by your health care provider. Do not take medicine containing aspirin or non-steroidal anti-inflammatory medicines such as ibuprofen for 1 week after this test. °· It is your responsibility to get your test results. °SEEK MEDICAL CARE IF:  °· You have increased bleeding from an incision that results in more than a  small spot of blood. °· You have redness, swelling, or increasing pain in any incisions. °· You notice a discharge or a bad smell coming from any of your incisions. °· You have a fever or chills. °SEEK IMMEDIATE MEDICAL CARE IF:  °· You develop swelling, bloating, or pain in your abdomen. °· You become dizzy or faint. °· You develop a rash. °· You are nauseous or vomit. °· You have difficulty breathing, feel short of breath, or feel faint. °· You develop chest pain. °· You have problems with your speech or vision. °· You have trouble balancing or moving your arms or legs. °  °This information is not intended to replace advice given to you by your health care provider. Make sure you discuss any questions you have with your health care provider. °  °Document Released: 10/04/2004 Document Revised: 04/07/2014 Document Reviewed: 05/13/2013 °[employer] Interactive Patient Education ©2016 [employer] °Liver Biopsy °The liver is a large organ in the upper right-hand side of your abdomen. A liver biopsy is a procedure in which a tissue sample is taken from the liver and examined under a microscope. The procedure is done to confirm a suspected problem. °There are three types of liver biopsies: °· Percutaneous. In this type, an incision is made in your abdomen. The sample is removed through the incision with a needle. °· Laparoscopic. In this type, several incisions are made in the abdomen. A tiny camera is passed through one of the incisions to help guide the health care provider.   The sample is removed through the other incision or incisions. °· Transjugular. In this type, an incision is made in the neck. A tube is passed through the incision to the liver. The sample is removed through the tube with a needle. °LET YOUR HEALTH CARE PROVIDER KNOW ABOUT: °· Any allergies you have. °· All medicines you are taking, including vitamins, herbs, eye drops, creams, and over-the-counter medicines. °· Previous problems you or members of  your family have had with the use of anesthetics. °· Any blood disorders you have. °· Previous surgeries you have had. °· Medical conditions you have. °· Possibility of pregnancy, if this applies. °RISKS AND COMPLICATIONS °Generally, this is a safe procedure. However, problems can occur and include: °· Bleeding. °· Infection. °· Bruising. °· Collapsed lung. °· Leak of digestive juices (bile) from the liver or gallbladder. °· Problems with heart rhythm. °· Pain at the biopsy site or in the right shoulder. °· Low blood pressure (hypotension). °· Injury to nearby organs or tissues. °BEFORE THE PROCEDURE °· Your health care provider may do some blood or urine tests. These will help your health care provider learn how well your kidneys and liver are working and how well your blood clots. °· Ask your health care provider if you will be able to go home the day of the procedure. Arrange for someone to take you home and stay with you for at least 24 hours. °· Do not eat or drink anything after midnight on the night before the procedure or as directed by your health care provider. °· Ask your health care provider about: °· Changing or stopping your regular medicines. This is especially important if you are taking diabetes medicines or blood thinners. °· Taking medicines such as aspirin and ibuprofen. These medicines can thin your blood. Do not take these medicines before your procedure if your health care provider asks you not to. °PROCEDURE °Regardless of the type of biopsy that will be done, you will have an IV line placed. Through this line, you will receive fluids and medicine to relax you. If you will be having a laparoscopic biopsy, you may also receive medicine through this line to make you sleep during the procedure (general anesthetic). °Percutaneous Liver Biopsy °· You will positioned on your back, with your right hand over your head. °· A health care provider will locate your liver by tapping and pressing on the  right side of your abdomen or with the help of an ultrasound machine or CT scan. °· An area at the bottom of your last right rib will be numbed. °· An incision will be made in the numbed area. °· The biopsy needle will be inserted into the incision. °· Several samples of liver tissue will be taken with the biopsy needle. You will be asked to hold your breath as each sample is taken. °Laparoscopic Liver Biopsy °· You will be positioned on your back. °· Several small incisions will be made in your abdomen. °· Your doctor will pass a tiny camera through one incision. The camera will allow the liver to be viewed on a TV monitor in the operating room. °· Tools will be passed through the other incision or incisions. These tools will be used to remove samples of liver tissue. °Transjugular Liver Biopsy °· You will be positioned on your back on an X-ray table, with your head turned to your left. °· An area on your neck just over your jugular vein will be numbed. °· An incision   will be made in the numbed area. °· A tiny tube will be inserted through the incision. It will be pushed through the jugular vein to a blood vessel in the liver called the hepatic vein. °· Dye will be inserted through the tube, and X-rays will be taken. The dye will make the blood vessels in the liver light up on the X-rays. °· The biopsy needle will be pushed through the tube until it reaches the liver. °· Samples of liver tissue will be taken with the biopsy needle. °· The needle and the tube will be removed. °After the samples are obtained, the incision or incisions will be closed. °AFTER THE PROCEDURE °· You will be taken to a recovery area. °· You may have to lie on your right side for 1-2 hours. This will prevent bleeding from the biopsy site. °· Your progress will be watched. Your blood pressure, pulse, and the biopsy site will be checked often. °· You may have some pain or feel sick. If this happens, tell your health care provider. °· As you  begin to feel better, you will be offered ice and beverages. °· You may be allowed to go home when the medicines have worn off and you can walk, drink, eat, and use the bathroom. °  °This information is not intended to replace advice given to you by your health care provider. Make sure you discuss any questions you have with your health care provider. °  °Document Released: 06/07/2003 Document Revised: 04/07/2014 Document Reviewed: 05/13/2013 °[employer] Interactive Patient Education ©2016 [employer] °Moderate Conscious Sedation, Adult °Sedation is the use of medicines to promote relaxation and relieve discomfort and anxiety. Moderate conscious sedation is a type of sedation. Under moderate conscious sedation you are less alert than normal but are still able to respond to instructions or stimulation. Moderate conscious sedation is used during short medical and dental procedures. It is milder than deep sedation or general anesthesia and allows you to return to your regular activities sooner. °LET YOUR HEALTH CARE PROVIDER KNOW ABOUT:  °· Any allergies you have. °· All medicines you are taking, including vitamins, herbs, eye drops, creams, and over-the-counter medicines. °· Use of steroids (by mouth or creams). °· Previous problems you or members of your family have had with the use of anesthetics. °· Any blood disorders you have. °· Previous surgeries you have had. °· Medical conditions you have. °· Possibility of pregnancy, if this applies. °· Use of cigarettes, alcohol, or illegal drugs. °RISKS AND COMPLICATIONS °Generally, this is a safe procedure. However, as with any procedure, problems can occur. Possible problems include: °· Oversedation. °· Trouble breathing on your own. You may need to have a breathing tube until you are awake and breathing on your own. °· Allergic reaction to any of the medicines used for the procedure. °BEFORE THE PROCEDURE °· You may have blood tests done. These tests can help show how  well your kidneys and liver are working. They can also show how well your blood clots. °· A physical exam will be done.   °· Only take medicines as directed by your health care provider. You may need to stop taking medicines (such as blood thinners, aspirin, or nonsteroidal anti-inflammatory drugs) before the procedure.   °· Do not eat or drink at least 6 hours before the procedure or as directed by your health care provider. °· Arrange for a responsible adult, family member, or friend to take you home after the procedure. He or she should stay with   you for at least 24 hours after the procedure, until the medicine has worn off. °PROCEDURE  °· An intravenous (IV) catheter will be inserted into one of your veins. Medicine will be able to flow directly into your body through this catheter. You may be given medicine through this tube to help prevent pain and help you relax. °· The medical or dental procedure will be done. °AFTER THE PROCEDURE °· You will stay in a recovery area until the medicine has worn off. Your blood pressure and pulse will be checked.   °·  Depending on the procedure you had, you may be allowed to go home when you can tolerate liquids and your pain is under control. °  °This information is not intended to replace advice given to you by your health care provider. Make sure you discuss any questions you have with your health care provider. °  °Document Released: 12/10/2000 Document Revised: 04/07/2014 Document Reviewed: 11/22/2012 °[employer] Interactive Patient Education ©2016 [employer] °Moderate Conscious Sedation, Adult, Care After °Refer to this sheet in the next few weeks. These instructions provide you with information on caring for yourself after your procedure. Your health care provider may also give you more specific instructions. Your treatment has been planned according to current medical practices, but problems sometimes occur. Call your health care provider if you have any problems or  questions after your procedure. °WHAT TO EXPECT AFTER THE PROCEDURE  °After your procedure: °· You may feel sleepy, clumsy, and have poor balance for several hours. °· Vomiting may occur if you eat too soon after the procedure. °HOME CARE INSTRUCTIONS °· Do not participate in any activities where you could become injured for at least 24 hours. Do not: °¨ Drive. °¨ Swim. °¨ Ride a bicycle. °¨ Operate heavy machinery. °¨ Cook. °¨ Use power tools. °¨ Climb ladders. °¨ Work from a high place. °· Do not make important decisions or sign legal documents until you are improved. °· If you vomit, drink water, juice, or soup when you can drink without vomiting. Make sure you have little or no nausea before eating solid foods. °· Only take over-the-counter or prescription medicines for pain, discomfort, or fever as directed by your health care provider. °· Make sure you and your family fully understand everything about the medicines given to you, including what side effects may occur. °· You should not drink alcohol, take sleeping pills, or take medicines that cause drowsiness for at least 24 hours. °· If you smoke, do not smoke without supervision. °· If you are feeling better, you may resume normal activities 24 hours after you were sedated. °· Keep all appointments with your health care provider. °SEEK MEDICAL CARE IF: °· Your skin is pale or bluish in color. °· You continue to feel nauseous or vomit. °· Your pain is getting worse and is not helped by medicine. °· You have bleeding or swelling. °· You are still sleepy or feeling clumsy after 24 hours. °SEEK IMMEDIATE MEDICAL CARE IF: °· You develop a rash. °· You have difficulty breathing. °· You develop any type of allergic problem. °· You have a fever. °MAKE SURE YOU: °· Understand these instructions. °· Will watch your condition. °· Will get help right away if you are not doing well or get worse. °  °This information is not intended to replace advice given to you by your  health care provider. Make sure you discuss any questions you have with your health care provider. °  °Document Released: 01/05/2013   Document Revised: 04/07/2014 Document Reviewed: 01/05/2013 °[employer] Interactive Patient Education ©2016 [employer] ° °

## 2015-03-20 NOTE — H&P (Signed)
Chief Complaint: Patient was seen in consultation today for liver lesion biopsy at the request of Magrinat,Gustav C  Referring Physician(s): Magrinat,Gustav C  History of Present Illness: [name] [name] is a 54 y.o. female with hx of breast cancer. She had recent PET showing hypermetabolic right liver lesion. She is referred for biopsy. PMHx, meds, labs, imaging reviewed. Has been NPO this am  Past Medical History  Diagnosis Date  . Seizures ([place]) 2010    Isolated incident.  Marland Kitchen PONV (postoperative nausea and vomiting)   . Peripheral vascular disease ([place]) 02/2010    blood clot related to porta cath  . Breast cancer ([place]) dx'd 2005/2011  . Bone metastases ([place]) dx'd 05/2014  . S/P radiation therapy 07/17/2014 through 08/02/2014     Left mediastinum, left seventh rib 3250 cGy in 13 sessions   . S/P radiation therapy 12/11/2014 through 12/22/2014     Left parietal calvarium 2400 cGy in 8 sessions     Past Surgical History  Procedure Laterality Date  . Breast lumpectomy  2005  . Axillary lymph node dissection  Dec. 2011  . Portacath placement  12/11  . Removal portacath    . Mediastinotomy chamberlain mcneil Left 06/02/2013    Procedure: MEDIASTINOTOMY CHAMBERLAIN MCNEIL;  Surgeon: [name], MD;  Location: [facility];  Service: Thoracic;  Laterality: Left;  LEFT ANTERIOR MEDIASTINOTOMY     Allergies: Decadron; Dilaudid; Fluconazole; Hydromorphone hcl; Morphine and related; Protonix; and Tegaderm ag mesh  Medications: Prior to Admission medications   Medication Sig Start Date End Date Taking? Authorizing Provider  ALPRAZolam Duanne Moron) 0.5 MG tablet Take 1 tablet (0.5 mg total) by mouth 2 (two) times daily as needed for anxiety. Patient taking differently: Take 0.125 mg by mouth 2 (two) times daily as needed for  anxiety.  10/12/14  Yes Chauncey Cruel, MD  B Complex-C (B-COMPLEX WITH VITAMIN C) tablet Take 1 tablet by mouth daily. 01/19/15  Yes Chauncey Cruel, MD  cholecalciferol 2000 UNITS tablet Take 1 tablet (2,000 Units total) by mouth daily. 01/19/15  Yes Chauncey Cruel, MD  folic acid (FOLVITE) 1 MG tablet Take 1 tablet (1 mg total) by mouth daily. 01/19/15  Yes Chauncey Cruel, MD  ibuprofen (ADVIL,MOTRIN) 200 MG tablet Take 400 mg by mouth every 4 (four) hours as needed for moderate pain.    Yes Historical Provider, MD  Melatonin 3 MG TABS Take 3 mg by mouth at bedtime.   Yes Historical Provider, MD  oseltamivir (TAMIFLU) 75 MG capsule Take 1 capsule (75 mg total) by mouth 2 (two) times daily. 03/16/15   Chauncey Cruel, MD     Family History  Problem Relation Age of Onset  . COPD Mother   . Breast cancer Sister 67    Social History   Social History  . Marital Status: Married    Spouse Name: N/A  . Number of Children: N/A  . Years of Education: N/A   Social History Main Topics  . Smoking status: Never Smoker   . Smokeless tobacco: Never Used  . Alcohol Use: No  . Drug Use: No  . Sexual Activity: Yes   Other Topics Concern  . None   Social History Narrative     Review of Systems: A 12 point ROS discussed and pertinent positives are indicated in the HPI above.  All other systems are negative.  Review of Systems  Vital Signs: BP 126/88 mmHg  Pulse 101  Temp(Src) 97.1 F (  36.2 C)  Resp 18  SpO2 99%  Physical Exam  Mallampati Score:  MD Evaluation Airway: WNL Heart: WNL Abdomen: WNL Chest/ Lungs: WNL ASA  Classification: 2 Mallampati/Airway Score: One  Imaging: Ct Head W Wo Contrast  03/13/2015  CLINICAL DATA:  Stage IV breast cancer EXAM: CT HEAD WITHOUT AND WITH CONTRAST TECHNIQUE: Contiguous axial images were obtained from the base of the skull through the vertex without and with intravenous contrast CONTRAST:  75 mL Omnipaque 300 IV E  COMPARISON:  MRI 10/18/2014 FINDINGS: Ventricle size normal.  Cerebral volume normal. Negative for acute or chronic infarction. Negative for intracranial mass or edema.  Negative for hemorrhage. Postcontrast imaging of the brain reveals no enhancing lesion. Vascular enhancement normal. Left parietal skull lesion measures 20 mm and is unchanged from the MRI of 10/18/2014. This enhances on MRI and is compatible with metastatic disease. This was not present in 2012. Right frontal lesion over the convexity measures 11 mm was not present on the recent MRI. This also is most likely due to metastatic disease. IMPRESSION: Negative for metastatic disease to the brain 20 mm left parietal bone lesion. This is unchanged. New 11 mm right frontal bone lesion consistent with metastatic disease. Electronically Signed   By: Franchot Gallo M.D.   On: 03/13/2015 15:14   Ct Chest W Contrast  03/13/2015  CLINICAL DATA:  Stage IV breast cancer. Subsequent treatment evaluation EXAM: CT CHEST WITH CONTRAST TECHNIQUE: Multidetector CT imaging of the chest was performed during intravenous contrast administration. CONTRAST:  5mL OMNIPAQUE IOHEXOL 300 MG/ML  SOLN COMPARISON:  CT 12/26/2014, PET-CT 10/30/2014 FINDINGS: Mediastinum/Nodes: Enlarged lymph node in the medial LEFT axilla measures 11 mm (image 14, series 4) compared to 11 mm. No supraclavicular adenopathy. Small paratracheal lymph nodes are unchanged measuring up to 8 mm. Subcarinal node measures 8 mm unchanged. No central pulmonary embolism. Lungs/Pleura: There is volume loss in the LEFT hemi thorax. There is consolidation with air bronchograms and bronchiectasis in the medial LEFT upper lobe not changed. Focus of round atelectasis at the LEFT lung base is unchanged. No new pulmonary nodularity. There is nodularity within the pleural space inferiorly in the LEFT hemi thorax similar to comparison exam (Image 38, series 4). There is metastatic lymph nodes in the precordial space  adjacent to the LEFT ventricle. For example 12 mm node on image 37, series 4 compares to 12 mm. Upper abdomen: Progression of liver lesions. Lesion in the RIGHT hepatic lobe is increased in size with peripheral enhancement measuring 17 mm (image 47, series 4) compared to 9 mm on prior remeasured. More centrally in the RIGHT hepatic lobe 11 mm lesion on image 53 compares to 7 mm. Lesions central LEFT hepatic lobe on image 50 measures 15 mm not changed. There is thickening of the LEFT crus of the diaphragm to 2.5 cm not changed from prior. Musculoskeletal: Paraspinal lesion at T11 measures 16 mm (image 43, series 4 compared to 17 mm. Sclerotic lesion at T1 similar to comparison exam. Sclerotic lesion in the medial LEFT clavicle is unchanged. IMPRESSION: 1. Interval enlargement of hepatic metastasis. 2. Essentially stable pleural disease in the LEFT hemi thorax with extension to the precordial space. 3. Stable paravertebral lesion adjacent to the T11 LEFT neural foramina. 4. Stable thickening at the LEFT crus of the diaphragm consistent metastatic deposit. Electronically Signed   By: Suzy Bouchard M.D.   On: 03/13/2015 17:05   Nm Pet Image Restag (ps) Skull Base To Thigh  03/19/2015  CLINICAL DATA:  Subsequent treatment strategy for metastatic breast cancer. EXAM: NUCLEAR MEDICINE PET SKULL BASE TO THIGH TECHNIQUE: 10.1 mCi F-18 FDG was injected intravenously. Full-ring PET imaging was performed from the skull base to thigh after the radiotracer. CT data was obtained and used for attenuation correction and anatomic localization. FASTING BLOOD GLUCOSE:  Value: 79 mg/dl COMPARISON:  CT head/chest dated 03/13/2015. PET-CT dated 10/30/2014. FINDINGS: NECK No hypermetabolic lymph nodes in the neck. 2.0 cm lytic lesion in the left parietal bone (series 4/ image 12), although poorly evaluated on PET given blooming related to brain activity. CHEST 11 mm central right upper lobe nodule (series 8/ image 26), previously 10  mm, max SUV 2.9. Radiation changes in the medial left upper lobe/ paramediastinal region. Extensive pleural-based nodularity in the left hemithorax, max SUV 9.6 posteriorly in the left lower lobe. Associated small left pleural effusion with associated hypermetabolism. Heart is normal in size. No pericardial effusion. Atherosclerotic calcifications of the aortic arch. Postsurgical changes related to prior right axillary lymph node dissection. Thoracic lymphadenopathy, including: --11 mm short axis left axillary node (series 4/ image 85), max SUV 11.2, previously 11.1 --Left IMA node, max SUV 8.6, previously 2.2 --8 mm short axis AP window node (series 4/ image 87), max SUV 5.2, previously 2.3 --10 mm short axis left subcarinal node (series 4/image 93), max SUV 11.1, previously 2.9 ABDOMEN/PELVIS 2.4 cm hypoenhancing lesion laterally in the posterior right hepatic dome (series 4/ image 123), max SUV 4.8, new. Additional 2.1 cm lesion inferiorly in the posterior right hepatic lobe (series 4/ image 130), max SUV 9.6, previously 3.7. 1.5 cm short axis node medial to the left kidney (series 4/ image 138), max SUV 9.7, previously 8.0. SKELETON Soft tissue metastasis in the lateral left pectoralis musculature, max SUV 3.5, previously 2.7. Osseous metastasis involving the right L2 transverse process, max SUV 5.6, new. Left sacral metastasis, max SUV 5.9, new. IMPRESSION: Findings have progressed from prior PET-CT. Radiation changes in the left upper lobe/paramediastinal region. Pleural-based nodularity in the left hemithorax. Associated left pleural effusion, likely malignant. 11 mm right upper lobe pulmonary metastasis, mildly progressed. Thoracic nodal metastases. Additional upper abdominal nodal metastasis medial to the left kidney. Two right hepatic metastases, mildly progressed. Soft tissue and osseous metastases, as described above, progressed/new. Electronically Signed   By: Julian Hy M.D.   On: 03/19/2015  16:55    Labs:  CBC:  Recent Labs  01/16/15 1023 02/13/15 1009 03/13/15 1230 03/20/15 1050  WBC 5.7 6.6 7.7 6.6  HGB 13.2 13.1 13.2 13.3  HCT 39.0 39.3 39.1 39.3  PLT 410* 337 393 396    COAGS:  Recent Labs  12/14/14 0800 03/20/15 1050  INR 1.12 1.00  APTT 28 27    BMP:  Recent Labs  11/17/14 1044 11/19/14 0502 11/20/14 0433  12/14/14 0800  01/03/15 1028 01/16/15 1023 02/13/15 1009 03/13/15 1230  NA 129* 133* 133*  < > 140  < > 137 138 137 138  K 4.1 3.8 3.6  < > 3.6  < > 3.6 4.0 4.4 4.1  CL 96* 97* 98*  --  103  --   --   --   --   --   CO2 26 28 29   < > 28  < > 27 27 25 24   GLUCOSE 112* 112* 106*  < > 100*  < > 91 90 99 80  BUN 7 7 7   < > 6  < > 9.3 9.7 10.4 10.9  CALCIUM 8.6* 8.6* 8.4*  < > 9.6  < > 9.8 9.9 9.8 10.1  CREATININE 0.47 0.59 0.56  < > 0.64  < > 0.7 0.7 0.7 0.8  GFRNONAA >60 >60 >60  --  >60  --   --   --   --   --   GFRAA >60 >60 >60  --  >60  --   --   --   --   --   < > = values in this interval not displayed.  LIVER FUNCTION TESTS:  Recent Labs  01/03/15 1028 01/16/15 1023 02/13/15 1009 03/13/15 1230  BILITOT 0.33 0.31 0.39 0.35  AST 33 44* 37* 37*  ALT 54 79* 61* 53  ALKPHOS 143 147 125 126  PROT 7.4 7.4 7.2 7.9  ALBUMIN 3.6 3.7 3.7 3.9   Assessment and Plan: Hx breast cancer Hypermetabolic hepatic lesion. For US guided biopsy. Labs reviewed, ok Risks and Benefits discussed with the patient including, but not limited to bleeding, infection, damage to adjacent structures or low yield requiring additional tests. All of the patient's questions were answered, patient is agreeable to proceed. Consent signed and in chart.    Thank you for this interesting consult.   A copy of this report was sent to the requesting provider on this date.  SignedAscencion Dike 03/20/2015, 11:51 AM   I spent a total of 20 minutes in face to face in clinical consultation, greater than 50% of which was counseling/coordinating care for  liver biopsy

## 2015-03-20 NOTE — Procedures (Signed)
US core bx liver lesion 18g x3 to surg path No complication No blood loss. See complete dictation in [facility] PACS.  

## 2015-03-21 ENCOUNTER — Ambulatory Visit: Payer: 59 | Admitting: Physical Therapy

## 2015-03-21 ENCOUNTER — Encounter: Payer: Self-pay | Admitting: Physical Therapy

## 2015-03-21 ENCOUNTER — Other Ambulatory Visit: Payer: Self-pay

## 2015-03-21 DIAGNOSIS — C801 Malignant (primary) neoplasm, unspecified: Secondary | ICD-10-CM

## 2015-03-21 DIAGNOSIS — I890 Lymphedema, not elsewhere classified: Secondary | ICD-10-CM | POA: Diagnosis not present

## 2015-03-21 DIAGNOSIS — C50211 Malignant neoplasm of upper-inner quadrant of right female breast: Secondary | ICD-10-CM

## 2015-03-21 DIAGNOSIS — M6289 Other specified disorders of muscle: Secondary | ICD-10-CM

## 2015-03-21 DIAGNOSIS — R1031 Right lower quadrant pain: Secondary | ICD-10-CM

## 2015-03-21 DIAGNOSIS — C7951 Secondary malignant neoplasm of bone: Secondary | ICD-10-CM

## 2015-03-21 DIAGNOSIS — M25551 Pain in right hip: Secondary | ICD-10-CM | POA: Diagnosis not present

## 2015-03-21 DIAGNOSIS — C771 Secondary and unspecified malignant neoplasm of intrathoracic lymph nodes: Secondary | ICD-10-CM

## 2015-03-21 NOTE — Therapy (Signed)
[place], [place], [address] Phone: [phone]   Fax:  [phone]  Physical Therapy Treatment  Patient Details  Name: [name] [name] MRN: [id number] Date of Birth: 01-11-1961 Referring Provider: Dr. Lurline Del  Encounter Date: 03/21/2015      PT End of Session - 03/21/15 1201    Visit Number 106   Number of Visits 110   Date for PT Re-Evaluation 03/31/15   Authorization - Number of Visits 110   PT Start Time 1012   PT Stop Time 1102   PT Time Calculation (min) 50 min   Activity Tolerance Patient tolerated treatment well   Behavior During Therapy [facility] for tasks assessed/performed      Past Medical History  Diagnosis Date  . Seizures ([place]) 2010    Isolated incident.  Marland Kitchen PONV (postoperative nausea and vomiting)   . Peripheral vascular disease ([place]) 02/2010    blood clot related to porta cath  . Breast cancer ([place]) dx'd 2005/2011  . Bone metastases ([place]) dx'd 05/2014  . S/P radiation therapy 07/17/2014 through 08/02/2014     Left mediastinum, left seventh rib 3250 cGy in 13 sessions   . S/P radiation therapy 12/11/2014 through 12/22/2014     Left parietal calvarium 2400 cGy in 8 sessions     Past Surgical History  Procedure Laterality Date  . Breast lumpectomy  2005  . Axillary lymph node dissection  Dec. 2011  . Portacath placement  12/11  . Removal portacath    . Mediastinotomy chamberlain mcneil Left 06/02/2013    Procedure: MEDIASTINOTOMY CHAMBERLAIN MCNEIL;  Surgeon: [name], MD;  Location: [facility];  Service: Thoracic;  Laterality: Left;  LEFT ANTERIOR MEDIASTINOTOMY     There were no vitals filed for this visit.  Visit Diagnosis:  Lymphedema  Muscle stiffness      Subjective Assessment - 03/21/15 1024    Subjective Hasn't got the full PET report, but knows that the liver spots were hot.  Will get full results tomorrow; also will have CT with contrast for abdomen tomorrow.   Currently in Pain? Yes   Pain Score 5    Pain Location Axilla   Pain Orientation Right   Pain Descriptors / Indicators Other (Comment)  hard   Pain Relieving Factors manual lymph drainage or to some extent, the pump                         [facility] Adult PT Treatment/Exercise - 03/21/15 0001    Manual Therapy   Myofascial Release right axilla with focus at superior scar tightness   Manual Lymphatic Drainage (MLD) In left sidelying, posterior interaxillary anastomosis and right axillo-inguinal anastomosis; in supine, short neck, left axilla and anterior interaxillary anastomosis, right groin and axillo-inguinal anastomosis, area between right breast scars, directing toward pathways, and right upper arm.  In right sidelying, left periscapular area toward left groin.                  PT Short Term Goals - 02/27/15 1011    PT SHORT TERM GOAL #1   Title pain with walking decreased >/= 25%   Time 3   Period Weeks   Status Achieved   PT SHORT TERM GOAL #2   Title pelvis stays in correct alignment for 3 weeks   Time 3   Period Weeks   Status Achieved  PT Long Term Goals - 03/06/15 1023    PT LONG TERM GOAL #1   Title indpendent with HEP   Time 6   Period Weeks   Status On-going  ongoing   PT LONG TERM GOAL #2   Title pain with walking decreased >/= 80%   Time 6   Period Weeks   Status On-going  got worse   PT LONG TERM GOAL #3   Title go up and down steps with a step over step pattern   Time 6   Period Weeks   Status On-going  avoiding steps           Long Term Clinic Goals - 03/21/15 1206    CC Long Term Goal  #1   Title Reports left upper back and left side pain is controlled at 4/10 or less with therapy at the current frequency.   Status On-going   CC Long  Term Goal  #2   Title Pt. will report swelling is adequately managed to enable ADL function at a consistent level.   Status On-going   CC Long Term Goal  #3   Title Pain in area of right ischial tuberosity will be reduced to 1/10 or less, and patient will no longer need assistive device for ambulation.   Status On-going            Plan - 03/21/15 1204    Clinical Impression Statement Patient is coping with possible further metastatic spread of her cancer and having several scans, biopsies, etc. this week.  Now not using her Flexitouch pump because of discomfort from  yesterday's liver biopsy.  Right lateral breast swelling is full to palpation today.   Pt will benefit from skilled therapeutic intervention in order to improve on the following deficits Pain;Decreased endurance;Difficulty walking;Increased fascial restricitons;Increased muscle spasms   Rehab Potential Good   Clinical Impairments Affecting Rehab Potential active cancer   PT Frequency 2x / week   PT Duration 6 weeks   PT Treatment/Interventions Manual lymph drainage;Manual techniques   PT Next Visit Plan for lymphedema, continue manual lymph drainage, myofascial release, and soft tissue work   Consulted and Agree with Plan of Care Patient        Problem List Patient Active Problem List   Diagnosis Date Noted  . Nausea with vomiting 11/18/2014  . Constipation 11/18/2014  . Left-sided thoracic back pain   . Bone metastases ([place]) 11/16/2014  . Back pain 11/15/2014  . 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 Uncontrolled pain 11/14/2014  . 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 Chest wall pain 03/21/2014  . Abnormal LFTs (liver function tests) 09/12/2013  . Pleural effusion 08/18/2013  . Breast cancer of upper-inner quadrant of right female breast ([place]) 08/18/2013  . 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

## 2015-03-21 NOTE — Therapy (Signed)
[facility] [address], [place], [place], [address] Phone: [phone]   Fax:  [phone]  Physical Therapy Treatment  Patient Details  Name: [name] [name] MRN: [id number] Date of Birth: 1960/12/28 Referring Provider: Dr. Lurline Del  Encounter Date: 03/21/2015      PT End of Session - 03/21/15 1236    Visit Number 107   Number of Visits 110   Date for PT Re-Evaluation 05/25/15   Authorization - Visit Number 107   Authorization - Number of Visits 110   PT Start Time [id number]   PT Stop Time 1315   PT Time Calculation (min) 40 min   Activity Tolerance Patient tolerated treatment well   Behavior During Therapy [facility] for tasks assessed/performed      Past Medical History  Diagnosis Date  . Seizures ([place]) 2010    Isolated incident.  Marland Kitchen PONV (postoperative nausea and vomiting)   . Peripheral vascular disease ([place]) 02/2010    blood clot related to porta cath  . Breast cancer ([place]) dx'd 2005/2011  . Bone metastases ([place]) dx'd 05/2014  . S/P radiation therapy 07/17/2014 through 08/02/2014     Left mediastinum, left seventh rib 3250 cGy in 13 sessions   . S/P radiation therapy 12/11/2014 through 12/22/2014     Left parietal calvarium 2400 cGy in 8 sessions     Past Surgical History  Procedure Laterality Date  . Breast lumpectomy  2005  . Axillary lymph node dissection  Dec. 2011  . Portacath placement  12/11  . Removal portacath    . Mediastinotomy chamberlain mcneil Left 06/02/2013    Procedure: MEDIASTINOTOMY CHAMBERLAIN MCNEIL;  Surgeon: [name], MD;  Location: [place];  Service: Thoracic;  Laterality: Left;  LEFT ANTERIOR MEDIASTINOTOMY     There were no vitals filed for this visit.  Visit Diagnosis:  Groin pain, right - Plan: PT plan of  care cert/re-cert      Subjective Assessment - 03/21/15 1239    Subjective yesterday spent day at hospital. I have not walked much today.    How long can you walk comfortably? make patient limp   Patient Stated Goals walking with minimal pain and to get relief from lymphedema at axilla and anterior chest    Currently in Pain? Yes   Pain Score 5    Pain Location Groin   Pain Orientation Right   Pain Descriptors / Indicators Pressure  hard   Pain Type Chronic pain   Pain Onset More than a month ago   Pain Frequency Constant   Aggravating Factors  as the day progressess   Pain Relieving Factors massage   Multiple Pain Sites No            [facility] PT Assessment - 03/21/15 0001    Assessment   Medical Diagnosis perirectal rectal/perineal pain   Strength   Overall Strength Comments right hip flexion 4/5   Right Hip ABduction 4/5   Palpation   SI assessment  pelvis in correct alignment   Palpation comment trigger point in right obturator internist, right SI joint, and right iliococcygeus                  Pelvic Floor Special Questions - 03/21/15 0001    Pelvic Floor Internal Exam Patient approves physical therapist to perform pelvic floor muscle assessment   Exam Type Vaginal   Palpation soft tissue work to right obturator internist, puborectalis, right quads, right piriformis, right gluteal.  [place] Adult PT Treatment/Exercise - 03/21/15 0001    Manual Therapy   Myofascial Release right axilla with focus at superior scar tightness   Manual Lymphatic Drainage (MLD) In left sidelying, posterior interaxillary anastomosis and right axillo-inguinal anastomosis; in supine, short neck, left axilla and anterior interaxillary anastomosis, right groin and axillo-inguinal anastomosis, area between right breast scars, directing toward pathways, and right upper arm.  In right sidelying, left periscapular area toward left groin.                PT Education -  03/21/15 1355    Education provided No          PT Short Term Goals - 02/27/15 1011    PT SHORT TERM GOAL #1   Title pain with walking decreased >/= 25%   Time 3   Period Weeks   Status Achieved   PT SHORT TERM GOAL #2   Title pelvis stays in correct alignment for 3 weeks   Time 3   Period Weeks   Status Achieved           PT Long Term Goals - 03/21/15 1312    PT LONG TERM GOAL #1   Title (p) indpendent with HEP   Time (p) 6   Period (p) Weeks   Status (p) On-going   PT LONG TERM GOAL #2   Title (p) pain with walking decreased >/= 80%   Period (p) Weeks   Status (p) --  50% better   PT LONG TERM GOAL #3   Title (p) go up and down steps with a step over step pattern   Time (p) 6   Period (p) Weeks   Status (p) On-going  step to step due to pain           Long Term Clinic Goals - 03/21/15 1206    CC Long Term Goal  #1   Title Reports left upper back and left side pain is controlled at 4/10 or less with therapy at the current frequency.   Status On-going   CC Long Term Goal  #2   Title Pt. will report swelling is adequately managed to enable ADL function at a consistent level.   Status On-going   CC Long Term Goal  #3   Title Pain in area of right ischial tuberosity will be reduced to 1/10 or less, and patient will no longer need assistive device for ambulation.   Status On-going            Plan - 03/21/15 1355    Clinical Impression Statement Patient is a 54 year old female with right groin pain.  Patient has to go up and down steps with step to step pattern, Patient ambulates with limp on right.  Patient right ilium is having difficulty with keeping her pelvis in correct alignment. Patient reports her pain in righ tgroin has decreased by 50% since initial evaluation.  right hip  abduction and extension 4/5.  Patient will benefit from physical therapy to reduce pain to reduce limp on right while walking.    Pt will benefit from skilled therapeutic  intervention in order to improve on the following deficits Pain;Decreased endurance;Difficulty walking;Increased fascial restricitons;Increased muscle spasms   Rehab Potential Good   Clinical Impairments Affecting Rehab Potential active cancer   PT Frequency 2x / week   PT Duration 8 weeks   PT Treatment/Interventions Patient/family education;Manual techniques;Therapeutic exercise;Therapeutic activities;Neuromuscular re-education;Passive range of motion   PT Next Visit Plan soft  tissue work, correct pelvis   PT Home Exercise Plan progress as needed   Consulted and Agree with Plan of Care Patient   PT Plan Continue manual lymph drainage, soft tissue work.        Problem List Patient Active Problem List   Diagnosis Date Noted  . Nausea with vomiting 11/18/2014  . Constipation 11/18/2014  . Left-sided thoracic back pain   . Bone metastases ([place]) 11/16/2014  . Back pain 11/15/2014  . 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 Uncontrolled pain 11/14/2014  . 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 Chest wall pain 03/21/2014  . Abnormal LFTs (liver function tests) 09/12/2013  . Pleural effusion 08/18/2013  . Breast cancer of upper-inner quadrant of right female breast ([place]) 08/18/2013  . 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 [place], [place], [address] Phone: [phone]   Fax:  [phone]  Name: [name] [name] MRN: [id number] Date of Birth: 11-05-1960

## 2015-03-22 ENCOUNTER — Ambulatory Visit: Payer: 59

## 2015-03-22 ENCOUNTER — Ambulatory Visit (HOSPITAL_COMMUNITY): Payer: 59

## 2015-03-22 ENCOUNTER — Ambulatory Visit (HOSPITAL_COMMUNITY)
Admission: RE | Admit: 2015-03-22 | Discharge: 2015-03-22 | Disposition: A | Discharge status: Home / Self Care | Payer: 59 | Source: Ambulatory Visit | Attending: Oncology | Admitting: Oncology

## 2015-03-22 ENCOUNTER — Encounter (HOSPITAL_COMMUNITY): Payer: Self-pay

## 2015-03-22 DIAGNOSIS — C801 Malignant (primary) neoplasm, unspecified: Secondary | ICD-10-CM

## 2015-03-22 DIAGNOSIS — C771 Secondary and unspecified malignant neoplasm of intrathoracic lymph nodes: Secondary | ICD-10-CM

## 2015-03-22 DIAGNOSIS — C787 Secondary malignant neoplasm of liver and intrahepatic bile duct: Secondary | ICD-10-CM | POA: Diagnosis not present

## 2015-03-22 DIAGNOSIS — C7951 Secondary malignant neoplasm of bone: Secondary | ICD-10-CM

## 2015-03-22 DIAGNOSIS — C50919 Malignant neoplasm of unspecified site of unspecified female breast: Secondary | ICD-10-CM

## 2015-03-22 DIAGNOSIS — C50211 Malignant neoplasm of upper-inner quadrant of right female breast: Secondary | ICD-10-CM

## 2015-03-22 MED ORDER — IOHEXOL 300 MG/ML  SOLN
100.0000 mL | Freq: Once | INTRAMUSCULAR | Status: AC | PRN
  Administered 2015-03-22: 100 mL via INTRAVENOUS

## 2015-03-22 NOTE — Progress Notes (Signed)
Pt accessed peripherally for ct/mri procedure today. Placed 22g LFA.

## 2015-03-23 ENCOUNTER — Telehealth: Payer: Self-pay

## 2015-03-23 ENCOUNTER — Ambulatory Visit (HOSPITAL_BASED_OUTPATIENT_CLINIC_OR_DEPARTMENT_OTHER): Payer: 59 | Admitting: Oncology

## 2015-03-23 ENCOUNTER — Encounter: Payer: Self-pay | Admitting: Radiation Oncology

## 2015-03-23 ENCOUNTER — Other Ambulatory Visit: Payer: Self-pay

## 2015-03-23 DIAGNOSIS — R945 Abnormal results of liver function studies: Secondary | ICD-10-CM

## 2015-03-23 DIAGNOSIS — R7989 Other specified abnormal findings of blood chemistry: Secondary | ICD-10-CM

## 2015-03-23 DIAGNOSIS — C50211 Malignant neoplasm of upper-inner quadrant of right female breast: Secondary | ICD-10-CM

## 2015-03-23 DIAGNOSIS — C801 Malignant (primary) neoplasm, unspecified: Secondary | ICD-10-CM

## 2015-03-23 DIAGNOSIS — C7951 Secondary malignant neoplasm of bone: Secondary | ICD-10-CM

## 2015-03-23 DIAGNOSIS — J90 Pleural effusion, not elsewhere classified: Secondary | ICD-10-CM

## 2015-03-23 DIAGNOSIS — C771 Secondary and unspecified malignant neoplasm of intrathoracic lymph nodes: Secondary | ICD-10-CM | POA: Diagnosis not present

## 2015-03-23 DIAGNOSIS — R0789 Other chest pain: Secondary | ICD-10-CM

## 2015-03-23 DIAGNOSIS — I890 Lymphedema, not elsewhere classified: Secondary | ICD-10-CM

## 2015-03-23 DIAGNOSIS — Z86718 Personal history of other venous thrombosis and embolism: Secondary | ICD-10-CM

## 2015-03-23 DIAGNOSIS — R52 Pain, unspecified: Secondary | ICD-10-CM

## 2015-03-23 MED ORDER — PALBOCICLIB 75 MG PO CAPS: 75.0000 mg | ORAL_CAPSULE | Freq: Every day | ORAL | Status: DC

## 2015-03-23 MED ORDER — ALPRAZOLAM 0.5 MG PO TABS: 0.5000 mg | ORAL_TABLET | Freq: Two times a day (BID) | ORAL | Status: DC | PRN

## 2015-03-23 NOTE — Progress Notes (Signed)
[name] [name]  [phone]) [phone] [phone]) [phone]     ID: [name] [name] OB: 1960/08/16  MR#: [id number]  HYW#:[id number]  PCP: Pcp Not In System GYN:  [name] [name] SU:  OTHER MD: [name] [name], [name] [name], [name] [name], [name] [name], [name] [name], [name] [name]  CHIEF COMPLAINT: Stage IV breast cancer  CURRENT TREATMENT: Fulvestrant, (denosumab,) (palbociclib)  BREAST CANCER HISTORY: From doctor [name] [name]'s intake note 03/20/2004:  "The patient is a very pleasant 54 year old female, without significant past medical history.  Her family history is significant for a sister who at age 26 was diagnosed with invasive ductal carcinoma.  She is a breast cancer survivor at age 48 now.  The patient states that she has never really had a screening mammogram until October 2005, when she felt that it was time for her to start having mammograms done on a yearly basis.  Therefore, on 01/26/04, she underwent a screening mammogram and an abnormality was detected in the upper outer right breast.  She, therefore, underwent spot compression views of both the right and the left breast.  The left breast revealed a well-defined mass in the upper outer left quadrant, present at the 2 o'clock position, measuring 1.8 cm, 6 cm from the nipple.  This, by ultrasound, was felt to be a simple cyst measuring 1.8 cm.  On the right breast, a spiculated mass was noted in the upper outer right quadrant.  The ultrasound revealed a shadowing irregular solid mass at the 10:30 position, 9 cm from the nipple, measuring 1.2 cm in greatest dimension, correlating with the spiculated mass seen on the mammogram.  The right axilla was negative ultrasonically.  Because of this, the patient underwent a needle biopsy of the right breast and the biopsy was positive invasive mammary carcinoma that showed features consistent with a high-grade invasive ductal carcinoma associated with desmoplastic stroma.   No in situ component was seen and no definite lymphovascular invasion was identified.  On the core biopsy, the tumor measured about 0.8 cm.  Because of this, she was seen by Dr. [name] and the patient was taken to the [facility] on March 15, 2004.  She underwent a right breast lumpectomy with sentinel node biopsy.  The final pathology revealed an invasive ductal carcinoma, measuring 1.7 cm, grade 2 of 3.  Margins were free of tumor.  Atypical lobular hyperplasia was noted.  One sentinel node was removed which was negative for metastatic disease.  The tumor was staged at T1c, N0 MX.  It was estrogen receptor positive, progesterone receptor positive.  HER-2/neu was 2+.  FISH was negative.  All margins were free of tumor.  She is now seen in Medical Oncology for further evaluation and management of this newly diagnosed T1c, node negative, stage I, invasive ductal carcinoma of the right breast."  Her subsequent history is as detailed below  INTERVAL HISTORY: [name] [name] returns today for follow-up of her estrogen receptor positive metastatic breast cancer accompanied by her husband, [name] [name]. Since the last visit here she was restaged with a CT scan of the abdomen and pelvis and a PET scan. Of course she has extensive disease in the left chest as before. The PET also noted hypermetabolic liver lesions. By CT scan of the abdomen the largest measures 1.5 cm. She also has bone lesions and in particular a sacral lesion which is associated with the low pelvic pain she has been fighting now for about 2 months.  She continues on fulvestrant, which she tolerates  well.   REVIEW OF SYSTEMS: Clinically [name] [name] is stable. She is very tender in her pelvic and perineal area. She is working with physical therapy regarding this but the problem persist. She finds that if she eats anything at all spicy she will start coughing. She is currently on a very bland diet area she becomes constipated very easily and has a hard time getting  out of that problem. She feels tired but tries to be as active as she can. Her left chest is tender. She has moderate hot flashes. A detailed review of systems today was otherwise stable  PAST MEDICAL HISTORY: Past Medical History  Diagnosis Date  . Seizures ([place]) 2010    Isolated incident.  Marland Kitchen PONV (postoperative nausea and vomiting)   . Peripheral vascular disease ([place]) 02/2010    blood clot related to porta cath  . S/P radiation therapy 07/17/2014 through 08/02/2014     Left mediastinum, left seventh rib 3250 cGy in 13 sessions   . S/P radiation therapy 12/11/2014 through 12/22/2014     Left parietal calvarium 2400 cGy in 8 sessions   . Breast cancer ([place]) dx'd 2005/2011  . Bone metastases ([place]) dx'd 05/2014    PAST SURGICAL HISTORY: Past Surgical History  Procedure Laterality Date  . Breast lumpectomy  2005  . Axillary lymph node dissection  Dec. 2011  . Portacath placement  12/11  . Removal portacath    . Mediastinotomy chamberlain mcneil Left 06/02/2013    Procedure: MEDIASTINOTOMY CHAMBERLAIN MCNEIL;  Surgeon: [name], MD;  Location: [facility];  Service: Thoracic;  Laterality: Left;  LEFT ANTERIOR MEDIASTINOTOMY     FAMILY HISTORY Family History  Problem Relation Age of Onset  . COPD Mother   . Breast cancer Sister 34   The patient's father is living, 39 years old as of may 2015. He lives in [place]. The patient's mother died from complications of COPD at the age of 70. These has 2 brothers, one sister. Her sister developed breast cancer at the age of 11. She is doing well. The patient herself underwent genetic testing at [facility] in 2011 and was found to be BRCA negative  GYNECOLOGIC HISTORY:  Menarche age 34, she is GX P0. She stopped having periods with her initial chemotherapy in 2006.  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 Her husband [name] [name] is a Medical illustrator in [place]. He has a child from a prior marriage. At home they have 2 rescue dogs, Hobo and [place]. The patient is religious but not a church attender    ADVANCED DIRECTIVES: In place; at the 08/04/2014 visit in particular the patient was very clear, with her husband present, that she would not want any kind of feeding tubes or "other tubes" if her condition deteriorated.   HEALTH MAINTENANCE: Social History  Substance Use Topics  . Smoking status: Never Smoker   . Smokeless tobacco: Never Used  . Alcohol Use: No     Colonoscopy:  PAP:  Bone density: March 2015; mild osteopenia  Lipid panel:  Allergies  Allergen Reactions  . Decadron [Dexamethasone] Other (See Comments)    Patient does not tolerate steroids.   . Dilaudid [Hydromorphone] Nausea And Vomiting  . Fluconazole Swelling    Liver toxicity  . Hydromorphone Hcl Nausea And Vomiting  . Morphine And Related Nausea And Vomiting  . Protonix [Pantoprazole Sodium] Other (See Comments)    Patient  reports it caused thrush.  . Tegaderm Ag Mesh [Silver]     Current Outpatient Prescriptions  Medication Sig Dispense Refill  . ALPRAZolam (XANAX) 0.5 MG tablet Take 1 tablet (0.5 mg total) by mouth 2 (two) times daily as needed for anxiety. 30 tablet 0  . B Complex-C (B-COMPLEX WITH VITAMIN C) tablet Take 1 tablet by mouth daily.    . cholecalciferol 2000 UNITS tablet Take 1 tablet (2,000 Units total) by mouth daily.    . folic acid (FOLVITE) 1 MG tablet Take 1 tablet (1 mg total) by mouth daily.    Marland Kitchen ibuprofen (ADVIL,MOTRIN) 200 MG tablet Take 400 mg by mouth every 4 (four) hours as needed for moderate pain.     . Melatonin 3 MG TABS Take 3 mg by mouth at bedtime.    Marland Kitchen oseltamivir (TAMIFLU) 75 MG capsule Take 1 capsule (75 mg total) by mouth 2 (two) times daily. 10 capsule 0  . saccharomyces  boulardii (FLORASTOR) 250 MG capsule Take 1 capsule (250 mg total) by mouth 2 (two) times daily.     No current facility-administered medications for this visit.    OBJECTIVE: Middle-aged white woman who appears stated age There were no vitals filed for this visit.   There is no weight on file to calculate BMI.   There were no vitals filed for this visit.    Patient refused vitals today (03/23/2015)    ECOG FS:2 - Symptomatic, <50% confined to bed  Sclerae unicteric, EOMs intact Oropharynx clear, dentition in good repair No cervical or supraclavicular adenopathy Lungs no rales or rhonchi, decreased breath sounds on left as previously noted Heart regular rate and rhythm Abd soft, nontender, positive bowel sounds MSK no focal spinal tenderness, no upper extremity lymphedema Neuro: nonfocal, well oriented, appropriate affect Breasts: Deferred     LAB RESULTS:   CMP     Component Value Date/Time   NA 138 03/13/2015 1230   NA 140 12/14/2014 0800   K 4.1 03/13/2015 1230   K 3.6 12/14/2014 0800   CL 103 12/14/2014 0800   CL 105 05/06/2012 1333   CO2 24 03/13/2015 1230   CO2 28 12/14/2014 0800   GLUCOSE 80 03/13/2015 1230   GLUCOSE 100* 12/14/2014 0800   GLUCOSE 124* 05/06/2012 1333   BUN 10.9 03/13/2015 1230   BUN 6 12/14/2014 0800   CREATININE 0.8 03/13/2015 1230   CREATININE 0.64 12/14/2014 0800   CALCIUM 10.1 03/13/2015 1230   CALCIUM 9.6 12/14/2014 0800   PROT 7.9 03/13/2015 1230   PROT 5.6* 11/20/2014 0433   ALBUMIN 3.9 03/13/2015 1230   ALBUMIN 2.6* 11/20/2014 0433   AST 37* 03/13/2015 1230   AST 28 11/20/2014 0433   ALT 53 03/13/2015 1230   ALT 62* 11/20/2014 0433   ALKPHOS 126 03/13/2015 1230   ALKPHOS 105 11/20/2014 0433   BILITOT 0.35 03/13/2015 1230   BILITOT 0.7 11/20/2014 0433   GFRNONAA >60 12/14/2014 0800   GFRAA >60 12/14/2014 0800    No results found for: SPEP  Lab Results  Component Value Date   WBC 6.6 03/20/2015   NEUTROABS 5.6  03/13/2015   HGB 13.3 03/20/2015   HCT 39.3 03/20/2015   MCV 93.6 03/20/2015   PLT 396 03/20/2015      Chemistry      Component Value Date/Time   NA 138 03/13/2015 1230   NA 140 12/14/2014 0800   K 4.1 03/13/2015 1230   K 3.6 12/14/2014 0800   CL 103  12/14/2014 0800   CL 105 05/06/2012 1333   CO2 24 03/13/2015 1230   CO2 28 12/14/2014 0800   BUN 10.9 03/13/2015 1230   BUN 6 12/14/2014 0800   CREATININE 0.8 03/13/2015 1230   CREATININE 0.64 12/14/2014 0800      Component Value Date/Time   CALCIUM 10.1 03/13/2015 1230   CALCIUM 9.6 12/14/2014 0800   ALKPHOS 126 03/13/2015 1230   ALKPHOS 105 11/20/2014 0433   AST 37* 03/13/2015 1230   AST 28 11/20/2014 0433   ALT 53 03/13/2015 1230   ALT 62* 11/20/2014 0433   BILITOT 0.35 03/13/2015 1230   BILITOT 0.7 11/20/2014 0433       Lab Results  Component Value Date   LABCA2 25 11/02/2007    No components found for: LABCA125   Recent Labs Lab 03/20/15 1050  INR 1.00    Urinalysis    Component Value Date/Time   COLORURINE YELLOW 11/17/2014 0143   APPEARANCEUR CLOUDY* 11/17/2014 0143   LABSPEC 1.010 11/17/2014 0143   LABSPEC 1.005 09/12/2013 1542   PHURINE 6.5 11/17/2014 0143   PHURINE 6.0 09/12/2013 1542   GLUCOSEU NEGATIVE 11/17/2014 0143   GLUCOSEU Negative 09/12/2013 1542   HGBUR NEGATIVE 11/17/2014 0143   HGBUR Negative 09/12/2013 1542   BILIRUBINUR NEGATIVE 11/17/2014 0143   BILIRUBINUR Negative 09/12/2013 1542   KETONESUR NEGATIVE 11/17/2014 0143   KETONESUR Negative 09/12/2013 1542   PROTEINUR NEGATIVE 11/17/2014 0143   PROTEINUR Negative 09/12/2013 1542   UROBILINOGEN 0.2 11/17/2014 0143   UROBILINOGEN 0.2 09/12/2013 1542   NITRITE NEGATIVE 11/17/2014 0143   NITRITE Negative 09/12/2013 1542   LEUKOCYTESUR NEGATIVE 11/17/2014 0143   LEUKOCYTESUR Negative 09/12/2013 1542    STUDIES: Ct Head W Wo Contrast  03/13/2015  CLINICAL DATA:  Stage IV breast cancer EXAM: CT HEAD WITHOUT AND WITH  CONTRAST TECHNIQUE: Contiguous axial images were obtained from the base of the skull through the vertex without and with intravenous contrast CONTRAST:  75 mL Omnipaque 300 IV E COMPARISON:  MRI 10/18/2014 FINDINGS: Ventricle size normal.  Cerebral volume normal. Negative for acute or chronic infarction. Negative for intracranial mass or edema.  Negative for hemorrhage. Postcontrast imaging of the brain reveals no enhancing lesion. Vascular enhancement normal. Left parietal skull lesion measures 20 mm and is unchanged from the MRI of 10/18/2014. This enhances on MRI and is compatible with metastatic disease. This was not present in 2012. Right frontal lesion over the convexity measures 11 mm was not present on the recent MRI. This also is most likely due to metastatic disease. IMPRESSION: Negative for metastatic disease to the brain 20 mm left parietal bone lesion. This is unchanged. New 11 mm right frontal bone lesion consistent with metastatic disease. Electronically Signed   By: Franchot Gallo M.D.   On: 03/13/2015 15:14   Ct Chest W Contrast  03/13/2015  CLINICAL DATA:  Stage IV breast cancer. Subsequent treatment evaluation EXAM: CT CHEST WITH CONTRAST TECHNIQUE: Multidetector CT imaging of the chest was performed during intravenous contrast administration. CONTRAST:  65m OMNIPAQUE IOHEXOL 300 MG/ML  SOLN COMPARISON:  CT 12/26/2014, PET-CT 10/30/2014 FINDINGS: Mediastinum/Nodes: Enlarged lymph node in the medial LEFT axilla measures 11 mm (image 14, series 4) compared to 11 mm. No supraclavicular adenopathy. Small paratracheal lymph nodes are unchanged measuring up to 8 mm. Subcarinal node measures 8 mm unchanged. No central pulmonary embolism. Lungs/Pleura: There is volume loss in the LEFT hemi thorax. There is consolidation with air bronchograms and bronchiectasis in the medial  LEFT upper lobe not changed. Focus of round atelectasis at the LEFT lung base is unchanged. No new pulmonary nodularity. There  is nodularity within the pleural space inferiorly in the LEFT hemi thorax similar to comparison exam (Image 38, series 4). There is metastatic lymph nodes in the precordial space adjacent to the LEFT ventricle. For example 12 mm node on image 37, series 4 compares to 12 mm. Upper abdomen: Progression of liver lesions. Lesion in the RIGHT hepatic lobe is increased in size with peripheral enhancement measuring 17 mm (image 47, series 4) compared to 9 mm on prior remeasured. More centrally in the RIGHT hepatic lobe 11 mm lesion on image 53 compares to 7 mm. Lesions central LEFT hepatic lobe on image 50 measures 15 mm not changed. There is thickening of the LEFT crus of the diaphragm to 2.5 cm not changed from prior. Musculoskeletal: Paraspinal lesion at T11 measures 16 mm (image 43, series 4 compared to 17 mm. Sclerotic lesion at T1 similar to comparison exam. Sclerotic lesion in the medial LEFT clavicle is unchanged. IMPRESSION: 1. Interval enlargement of hepatic metastasis. 2. Essentially stable pleural disease in the LEFT hemi thorax with extension to the precordial space. 3. Stable paravertebral lesion adjacent to the T11 LEFT neural foramina. 4. Stable thickening at the LEFT crus of the diaphragm consistent metastatic deposit. Electronically Signed   By: Suzy Bouchard M.D.   On: 03/13/2015 17:05   Ct Abdomen Pelvis W Contrast  03/22/2015  CLINICAL DATA:  Followup metastatic right breast carcinoma. EXAM: CT ABDOMEN AND PELVIS WITH CONTRAST TECHNIQUE: Multidetector CT imaging of the abdomen and pelvis was performed using the standard protocol following bolus administration of intravenous contrast. CONTRAST:  138m OMNIPAQUE IOHEXOL 300 MG/ML  SOLN COMPARISON:  PET-CT on 10/30/2014 and 03/19/2015 FINDINGS: Lower chest: Pleural-based soft tissue nodules in the inferior left hemithorax and small left pleural effusion show no significant change. Hepatobiliary: Several new hypovascular lesions are seen in the  right hepatic lobe, largest measuring 1.5 cm on image 23/ series 2, consistent liver metastases. Pancreas: No mass, inflammatory changes, or other significant abnormality. Spleen: Within normal limits in size and appearance. Adrenals/Urinary Tract: No masses identified. No evidence of hydronephrosis. Stomach/Bowel: No evidence of obstruction, inflammatory process, or abnormal fluid collections. Vascular/Lymphatic: Mild increase in retroperitoneal lymphadenopathy seen in the left paraaortic region, currently measuring 1.8 x 2.8 cm on image 26 compared to 1.5 x 2.5 cm previously. Reproductive: No mass or other significant abnormality. Other: None. Musculoskeletal: No suspicious bone lesions visualized by CT, although hypermetabolic bone lesions seen on most recent PET involving the right L2 transverse process and left sacrum are consistent with bone metastases. IMPRESSION: New small liver metastases compared to previous PET-CT on 10/30/2014. Minimal increase in left paraaortic retroperitoneal lymphadenopathy. Stable appearance of pleural-based metastatic disease in lower left hemithorax and small left pleural effusion. Small lumbosacral bone metastases demonstrated on most recent PET-CT of 03/19/2015 are not well visualized by CT. Electronically Signed   By: JEarle GellM.D.   On: 03/22/2015 12:03   Nm Pet Image Restag (ps) Skull Base To Thigh  03/19/2015  CLINICAL DATA:  Subsequent treatment strategy for metastatic breast cancer. EXAM: NUCLEAR MEDICINE PET SKULL BASE TO THIGH TECHNIQUE: 10.1 mCi F-18 FDG was injected intravenously. Full-ring PET imaging was performed from the skull base to thigh after the radiotracer. CT data was obtained and used for attenuation correction and anatomic localization. FASTING BLOOD GLUCOSE:  Value: 79 mg/dl COMPARISON:  CT head/chest dated 03/13/2015.  PET-CT dated 10/30/2014. FINDINGS: NECK No hypermetabolic lymph nodes in the neck. 2.0 cm lytic lesion in the left parietal bone  (series 4/ image 12), although poorly evaluated on PET given blooming related to brain activity. CHEST 11 mm central right upper lobe nodule (series 8/ image 26), previously 10 mm, max SUV 2.9. Radiation changes in the medial left upper lobe/ paramediastinal region. Extensive pleural-based nodularity in the left hemithorax, max SUV 9.6 posteriorly in the left lower lobe. Associated small left pleural effusion with associated hypermetabolism. Heart is normal in size. No pericardial effusion. Atherosclerotic calcifications of the aortic arch. Postsurgical changes related to prior right axillary lymph node dissection. Thoracic lymphadenopathy, including: --11 mm short axis left axillary node (series 4/ image 85), max SUV 11.2, previously 11.1 --Left IMA node, max SUV 8.6, previously 2.2 --8 mm short axis AP window node (series 4/ image 87), max SUV 5.2, previously 2.3 --10 mm short axis left subcarinal node (series 4/image 93), max SUV 11.1, previously 2.9 ABDOMEN/PELVIS 2.4 cm hypoenhancing lesion laterally in the posterior right hepatic dome (series 4/ image 123), max SUV 4.8, new. Additional 2.1 cm lesion inferiorly in the posterior right hepatic lobe (series 4/ image 130), max SUV 9.6, previously 3.7. 1.5 cm short axis node medial to the left kidney (series 4/ image 138), max SUV 9.7, previously 8.0. SKELETON Soft tissue metastasis in the lateral left pectoralis musculature, max SUV 3.5, previously 2.7. Osseous metastasis involving the right L2 transverse process, max SUV 5.6, new. Left sacral metastasis, max SUV 5.9, new. IMPRESSION: Findings have progressed from prior PET-CT. Radiation changes in the left upper lobe/paramediastinal region. Pleural-based nodularity in the left hemithorax. Associated left pleural effusion, likely malignant. 11 mm right upper lobe pulmonary metastasis, mildly progressed. Thoracic nodal metastases. Additional upper abdominal nodal metastasis medial to the left kidney. Two right  hepatic metastases, mildly progressed. Soft tissue and osseous metastases, as described above, progressed/new. Electronically Signed   By: Julian Hy M.D.   On: 03/19/2015 16:55   US Biopsy  03/20/2015  CLINICAL DATA:  Progression of hypermetabolic hepatic metastatic disease. Breast carcinoma. EXAM: ULTRASOUND-GUIDED CORE LIVER BIOPSY TECHNIQUE: An ultrasound guided liver biopsy was thoroughly discussed with the patient and questions were answered. The benefits, risks, alternatives, and complications were also discussed. The patient understands and wishes to proceed with the procedure. A verbal as well as written consent was obtained. Survey ultrasound of the liver was performed, a representative right lesion localized, and an appropriate skin entry site was determined. Skin site was marked, prepped with Betadine, and draped in usual sterile fashion, and infiltrated locally with 1% lidocaine. Intravenous Fentanyl and Versed were administered as conscious sedation during continuous cardiorespiratory monitoring by the radiology RN, with a total moderate sedation time of 12 minutes. A 17 gauge trocar needle was advanced under ultrasound guidance into the liver. 3 coaxial 18gauge core samples were then obtained through the guide needle. The guide needle was removed. Post procedure scans demonstrate no apparent complication. COMPLICATIONS: COMPLICATIONS None immediate FINDINGS: Multiple right lobe liver lesions localized. Ultrasound-guided percutaneous core biopsy samples of the dominant lesion obtained. IMPRESSION: 1. Technically successful ultrasound guided core liver lesion biopsy. Electronically Signed   By: Lucrezia Europe M.D.   On: 03/20/2015 14:32    ASSESSMENT: 54 y.o. [place] woman with stage IV breast cancer, history as follows  (1)  S/p Right lumpectomy and sentinel lymph node sampling 03/15/2004 for a pT1c pN0. Stage IA invasive ductal carcinoma, grade 2, estrogen receptor 95% positive,  progesterone receptor 65% positive, HER-2 not amplified; additional surgery 04/25/2004 for seroma or clearance showed no residual tumor  (2) adjuvant chemotherapy with cyclophosphamide and doxorubicin every 21 days x4 completed 07/19/2004  (3) adjuvant radiation given under Dr. Donella Stade in [place] completed July 2006  (4) the patient opted against adjuvant antiestrogen therapy  (5) genetics testing showed no BRCA mutations  (6) biopsy of a palpable right axillary mass 10/24/2009 showed invasive ductal carcinoma, grade 3, estrogen receptor 100% positive, progesterone receptor 2% positive (alert score 5) HER-2 negative; no evidence of systemic disease on PET scanning  (7) completed 3 of 4 planned cycles of docetaxel and cyclophosphamide September 2011, fourth cycle omitted because of marked elevations in liver function tests  (8) an right axillary lymph node dissection 03/06/2010 showed 3/8 lymph nodes removed to be involved by tumor, with extracapsular extension.  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, TTS-1 negative), estrogen receptor 80% positive, progesterone receptor 2% positive, HER-2 not amplified  (12) letrozole started March 2015-- tolerated with significant side effects, discontinued at the end of May 2015  (13) PET scan 08/16/2013 shows extensive left pleural metastatic disease and a large left pleural effusion that shifts cardiac and mediastinal structures to the right; adenopathy (celiac trunk, periadrenal, periaortic); and a left medial clavicular lesion; Status post left  thoracentesis 08/16/2013 positive for adenocarcinoma, estrogen receptor positive, progesterone receptor negative.  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; everolimus was suggested as an available targeted agent  (18) exemestane started 03/31/2014, discontinued 10/31/2014 with evidence of progression  (a) everolimus added 04/03/2014 but not tolerated (cytopenias, elevated LFTs) even at minimal doses; stopped 04/17/2014  (19) fulvestrant started 12/20/2014    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 treated with Lovenox for 5-6 months  (c) right upper extremity lymphedema--receiving physical therapy  (d) hepatic steatosis with chronically elevated LFTs as well as unusual hepatic sensitivity to chemotherapy  (e) osteopenia with the lowest T score -1.6 on bone density scan 06/20/2013  (f) radiation oncology (Dr Valere Dross) has reviewed prior radiation records in case there is further mediastinal involvement with dysphagia etc in which case palliative XRT could be considered  (a) radiation to left mediastinum/ left 7th rib 3250 cGy in 13 sessions04/18/2016 through 08/02/2014  (b) radiation to T11 area: 22 Gy in 7 sessions, last dose 11/27/2014  (c) radiation left parietal scalp region to be completed 12/22/2014  (g) chest wall pain--improved post radiation treatments   PLAN: We spent approximately 50  minutes going over Lanell is complex situation. She understands there has been evidence of disease progression on fulvestrant alone. We have many options to intensified treatment, but she is very concerned regarding  side effects and in particular liver toxicity or inflammation. After much discussion she agreed to start Palbociclib at 1 tablet a week, which she will begin January 3. If she is able to tolerate that, we will go to twice a week and if she can tolerate that we will go to 3 times a week and if we can do 3 times a week 3 weeks on one week off we will be within range of the original study.  We discussed her bony metastases. She is concerned that these are growing. The best treatment for these is either zolendronate or Denosumab. We discussed the possible toxicities, side effects and complications of these agents including the rare case of osteonecrosis of the jaw. She is very concerned because she had 1 dose of Neulasta remotely which caused her intolerable bone pain for a week. She certainly could have the same with either of these agents, but I think we simply have to try. She tells me she will not under any circumstances take any narcotics for pain but there is no reason she cannot take Aleve 2 tablets 3 times a day with food if necessary.  Accordingly we have scheduled her for the first dose of Denosumab/Xgeva on January 11. She will get her fulvestrant the same day, of course. She will also have lab work that day. She will then see me the next morning. If all is going well we will set her up for 2 doses of Palbociclib the following week with lab work to follow and continue the [place] on an every 4 week basis together with her Faslodex. Incidentally I have asked Lorelie to make sure she gets her teeth looked at by her dentist before she starts the [place]  Finally we have the sacral lesion which is causing her significant pain. This is affecting her ability to walk and exercise. I think she may be a good  candidate for stereotactic radiosurgery and I'm referring her to Dr. Tammi Klippel without in mind.  Reason knows to call for any problems that may develop before her next visit here.  Marland Kitchen   Chauncey Cruel, MD   03/23/2015 10:51 AM

## 2015-03-23 NOTE — Progress Notes (Signed)
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 Current treatment:fulvestrant, denosumab, palbociclib  Pain on a scale of 0-10 is: low pelvic/perineal pain x 2 months affecting her ability to walk and exercise   If Spine Met(s), symptoms, if any, include:  Bowel/Bladder retention or incontinence (please describe): constipates easily  Numbness or weakness in extremities (please describe): None   Current Decadron regimen, if applicable: No  Ambulatory status? Walker? Wheelchair?: Ambulatory  SAFETY ISSUES:  Prior radiation? yes  Pacemaker/ICD? no  Possible current pregnancy? no  Is the patient on methotrexate? no  Current Complaints / other details:  54 year old female. Refused to allow nurse to assess her weight and vitals. Requested a vaginal dilator. Scheduled to begin taking Ibrance January 3rd and [facility] January 11th. Refusing to see a PA in the future. Requesting Dr. Tammi Klippel pull up her PET so she can visualize "what is happening in her body."

## 2015-03-23 NOTE — Telephone Encounter (Signed)
Patient was calling questioning whether Dr. Jana Hakim wants her to start on the Ibrance 75 mg or 125 mg which she has at home.  Per Dr. Doris Cheadle patient is to start on the Ibrance 75 mg on 04/03/15.  New prescrition sent to [place].  Patient aware.

## 2015-03-27 ENCOUNTER — Ambulatory Visit
Admission: RE | Admit: 2015-03-27 | Discharge: 2015-03-27 | Disposition: A | Discharge status: Home / Self Care | Payer: 59 | Source: Ambulatory Visit | Attending: Radiation Oncology | Admitting: Radiation Oncology

## 2015-03-27 ENCOUNTER — Encounter: Payer: Self-pay | Admitting: Radiation Oncology

## 2015-03-27 DIAGNOSIS — C50919 Malignant neoplasm of unspecified site of unspecified female breast: Secondary | ICD-10-CM | POA: Insufficient documentation

## 2015-03-27 DIAGNOSIS — C801 Malignant (primary) neoplasm, unspecified: Secondary | ICD-10-CM

## 2015-03-27 DIAGNOSIS — Z510 Encounter for antineoplastic radiation therapy: Secondary | ICD-10-CM | POA: Insufficient documentation

## 2015-03-27 DIAGNOSIS — M25551 Pain in right hip: Secondary | ICD-10-CM | POA: Diagnosis not present

## 2015-03-27 DIAGNOSIS — Z853 Personal history of malignant neoplasm of breast: Secondary | ICD-10-CM | POA: Diagnosis not present

## 2015-03-27 DIAGNOSIS — C7951 Secondary malignant neoplasm of bone: Secondary | ICD-10-CM

## 2015-03-27 NOTE — Progress Notes (Signed)
  Radiation Oncology         [phone] ________________________________  Name: [name] [name] MRN: [id number]  Date: 03/27/2015  DOB: 07-15-1960  STEREOTACTIC BODY RADIOTHERAPY SIMULATION AND TREATMENT PLANNING NOTE    ICD-9-CM ICD-10-CM   1. 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  Identity was confirmed.  All relevant records and images related to the planned course of therapy were reviewed.  The patient freely provided informed written consent to proceed with treatment after reviewing the details related to the planned course of therapy. The consent form was witnessed and verified by the simulation staff.  Then, the patient was set-up in a stable reproducible  supine position for radiation therapy.  A BodyFix immobilization pillow was fabricated for reproducible positioning.  Surface markings were placed.  The CT images were loaded into the planning software.  The gross target volumes (GTV) and planning target volumes (PTV) were delinieated, and avoidance structures were contoured.  Treatment planning then occurred.  The radiation prescription was entered and confirmed.  A total of two complex treatment devices were fabricated in the form of the BodyFix immobilization pillow and a neck accuform cushion.  I have requested : 3D Simulation  I have requested a DVH of the following structures: target and all normal structures near the target including bladder, right hip, left hip, vagina, rectum and labial folds as noted on the radiation plan to maintain doses in adherence with established limits  PLAN:  The patient will receive 24 Gy in 4 fractions of 6 Gy.  ________________________________  [name] [name], M.D.  This document serves as a record of services personally performed by Tyler Pita, MD. It was  created on his behalf by Derek Mound, a trained medical scribe. The creation of this record is based on the scribe's personal observations and the provider's statements to them. This document has been checked and approved by the attending provider.

## 2015-03-27 NOTE — Progress Notes (Signed)
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 Bone metastases (HCC) 198.5 C79.51     Interval Since Last Radiation:  3  months  Narrative:  The patient returns today for reconsult. The patient is a 54 yo female with history of Stage IV breast cancer. She has received previous courses of radiation treatment from Dr. [name]. Previous courses of radiation include radiotherapy to the left rib cage (4-07/2014) as well as thoracic spine radiation in August 2016 and left skull radiation in September 2016. She also has recevied radiotherapy to at [facility] at the time of her primary diagnosis.  She recently underwent PET/CT on 03/19/15. This study demonstrated progressive disease.   Patient describes pain in right medial groin.                     ALLERGIES:  is allergic to 2nd skin quick heal; decadron; dilaudid; enoxaparin; fluconazole; hydromorphone hcl; morphine and related; protonix; and tegaderm ag mesh.  Meds: Current Outpatient Prescriptions  Medication Sig Dispense Refill  . cholecalciferol 2000 UNITS tablet Take 1 tablet (2,000 Units total) by mouth daily.    . folic acid (FOLVITE) 1 MG tablet Take 1 tablet (1 mg total) by mouth daily.    . magnesium chloride (SLOW-MAG) 64 MG TBEC SR tablet Take 60 mg by mouth daily.    . Melatonin 3 MG TABS Take 3 mg by mouth at bedtime.    . naproxen sodium (ANAPROX) 220 MG tablet Take 220 mg by mouth 2 (two) times daily with a meal.    . saccharomyces boulardii (FLORASTOR) 250 MG capsule Take 250 mg by mouth daily.     Marland Kitchen acetaminophen (TYLENOL) 160 mg/5 mL SOLN Reported on 03/27/2015    . ALPRAZolam (XANAX) 0.5 MG tablet Take 1 tablet (0.5 mg  total) by mouth 2 (two) times daily as needed for anxiety. (Patient not taking: Reported on 03/27/2015) 30 tablet 0  . B Complex-C (B-COMPLEX WITH VITAMIN C) tablet Take 1 tablet by mouth daily. Reported on 03/27/2015    . ibuprofen (ADVIL,MOTRIN) 200 MG tablet Take 400 mg by mouth every 4 (four) hours as needed for moderate pain. Reported on 03/27/2015    . palbociclib (IBRANCE) 75 MG capsule Take 1 capsule (75 mg total) by mouth daily with breakfast. Take whole with food. (Patient not taking: Reported on 03/27/2015) 11 capsule 0   No current facility-administered medications for this encounter.    Physical Findings: The patient is in no acute distress. Patient is alert and oriented.  vitals were not taken for this visit..  No significant changes. Patient localized pain to right medial groin Motor strength is intact in lower extremities   Lab Findings: Lab Results  Component Value Date   WBC 6.6 03/20/2015   WBC 7.7 03/13/2015   WBC 6.1 11/02/2007   HGB 13.3 03/20/2015   HGB 13.2 03/13/2015   HGB 13.8 11/02/2007   HCT 39.3 03/20/2015   HCT 39.1 03/13/2015   HCT 39.9 11/02/2007   PLT 396 03/20/2015   PLT 393 03/13/2015   PLT 323 11/02/2007    Lab Results  Component Value Date   NA 138 03/13/2015  NA 140 12/14/2014   K 4.1 03/13/2015   K 3.6 12/14/2014   CHLORIDE 103 03/13/2015   CO2 24 03/13/2015   CO2 28 12/14/2014   GLUCOSE 80 03/13/2015   GLUCOSE 100* 12/14/2014   GLUCOSE 124* 05/06/2012   BUN 10.9 03/13/2015   BUN 6 12/14/2014   CREATININE 0.8 03/13/2015   CREATININE 0.64 12/14/2014   BILITOT 0.35 03/13/2015   BILITOT 0.7 11/20/2014   ALKPHOS 126 03/13/2015   ALKPHOS 105 11/20/2014   AST 37* 03/13/2015   AST 28 11/20/2014   ALT 53 03/13/2015   ALT 62* 11/20/2014   PROT 7.9 03/13/2015   PROT 5.6* 11/20/2014   ALBUMIN 3.9 03/13/2015   ALBUMIN 2.6* 11/20/2014   CALCIUM 10.1 03/13/2015   CALCIUM 9.6 12/14/2014   ANIONGAP 12* 03/13/2015   ANIONGAP 9  12/14/2014    Radiographic Findings: Ct Head W Wo Contrast  03/13/2015  CLINICAL DATA:  Stage IV breast cancer EXAM: CT HEAD WITHOUT AND WITH CONTRAST TECHNIQUE: Contiguous axial images were obtained from the base of the skull through the vertex without and with intravenous contrast CONTRAST:  75 mL Omnipaque 300 IV E COMPARISON:  MRI 10/18/2014 FINDINGS: Ventricle size normal.  Cerebral volume normal. Negative for acute or chronic infarction. Negative for intracranial mass or edema.  Negative for hemorrhage. Postcontrast imaging of the brain reveals no enhancing lesion. Vascular enhancement normal. Left parietal skull lesion measures 20 mm and is unchanged from the MRI of 10/18/2014. This enhances on MRI and is compatible with metastatic disease. This was not present in 2012. Right frontal lesion over the convexity measures 11 mm was not present on the recent MRI. This also is most likely due to metastatic disease. IMPRESSION: Negative for metastatic disease to the brain 20 mm left parietal bone lesion. This is unchanged. New 11 mm right frontal bone lesion consistent with metastatic disease. Electronically Signed   By: Franchot Gallo M.D.   On: 03/13/2015 15:14   Ct Chest W Contrast  03/13/2015  CLINICAL DATA:  Stage IV breast cancer. Subsequent treatment evaluation EXAM: CT CHEST WITH CONTRAST TECHNIQUE: Multidetector CT imaging of the chest was performed during intravenous contrast administration. CONTRAST:  1mL OMNIPAQUE IOHEXOL 300 MG/ML  SOLN COMPARISON:  CT 12/26/2014, PET-CT 10/30/2014 FINDINGS: Mediastinum/Nodes: Enlarged lymph node in the medial LEFT axilla measures 11 mm (image 14, series 4) compared to 11 mm. No supraclavicular adenopathy. Small paratracheal lymph nodes are unchanged measuring up to 8 mm. Subcarinal node measures 8 mm unchanged. No central pulmonary embolism. Lungs/Pleura: There is volume loss in the LEFT hemi thorax. There is consolidation with air bronchograms and  bronchiectasis in the medial LEFT upper lobe not changed. Focus of round atelectasis at the LEFT lung base is unchanged. No new pulmonary nodularity. There is nodularity within the pleural space inferiorly in the LEFT hemi thorax similar to comparison exam (Image 38, series 4). There is metastatic lymph nodes in the precordial space adjacent to the LEFT ventricle. For example 12 mm node on image 37, series 4 compares to 12 mm. Upper abdomen: Progression of liver lesions. Lesion in the RIGHT hepatic lobe is increased in size with peripheral enhancement measuring 17 mm (image 47, series 4) compared to 9 mm on prior remeasured. More centrally in the RIGHT hepatic lobe 11 mm lesion on image 53 compares to 7 mm. Lesions central LEFT hepatic lobe on image 50 measures 15 mm not changed. There is thickening of the LEFT crus of the diaphragm to 2.5  cm not changed from prior. Musculoskeletal: Paraspinal lesion at T11 measures 16 mm (image 43, series 4 compared to 17 mm. Sclerotic lesion at T1 similar to comparison exam. Sclerotic lesion in the medial LEFT clavicle is unchanged. IMPRESSION: 1. Interval enlargement of hepatic metastasis. 2. Essentially stable pleural disease in the LEFT hemi thorax with extension to the precordial space. 3. Stable paravertebral lesion adjacent to the T11 LEFT neural foramina. 4. Stable thickening at the LEFT crus of the diaphragm consistent metastatic deposit. Electronically Signed   By: Suzy Bouchard M.D.   On: 03/13/2015 17:05   Ct Abdomen Pelvis W Contrast  03/22/2015  CLINICAL DATA:  Followup metastatic right breast carcinoma. EXAM: CT ABDOMEN AND PELVIS WITH CONTRAST TECHNIQUE: Multidetector CT imaging of the abdomen and pelvis was performed using the standard protocol following bolus administration of intravenous contrast. CONTRAST:  113mL OMNIPAQUE IOHEXOL 300 MG/ML  SOLN COMPARISON:  PET-CT on 10/30/2014 and 03/19/2015 FINDINGS: Lower chest: Pleural-based soft tissue nodules in  the inferior left hemithorax and small left pleural effusion show no significant change. Hepatobiliary: Several new hypovascular lesions are seen in the right hepatic lobe, largest measuring 1.5 cm on image 23/ series 2, consistent liver metastases. Pancreas: No mass, inflammatory changes, or other significant abnormality. Spleen: Within normal limits in size and appearance. Adrenals/Urinary Tract: No masses identified. No evidence of hydronephrosis. Stomach/Bowel: No evidence of obstruction, inflammatory process, or abnormal fluid collections. Vascular/Lymphatic: Mild increase in retroperitoneal lymphadenopathy seen in the left paraaortic region, currently measuring 1.8 x 2.8 cm on image 26 compared to 1.5 x 2.5 cm previously. Reproductive: No mass or other significant abnormality. Other: None. Musculoskeletal: No suspicious bone lesions visualized by CT, although hypermetabolic bone lesions seen on most recent PET involving the right L2 transverse process and left sacrum are consistent with bone metastases. IMPRESSION: New small liver metastases compared to previous PET-CT on 10/30/2014. Minimal increase in left paraaortic retroperitoneal lymphadenopathy. Stable appearance of pleural-based metastatic disease in lower left hemithorax and small left pleural effusion. Small lumbosacral bone metastases demonstrated on most recent PET-CT of 03/19/2015 are not well visualized by CT. Electronically Signed   By: Earle Gell M.D.   On: 03/22/2015 12:03   Nm Pet Image Restag (ps) Skull Base To Thigh  03/19/2015  CLINICAL DATA:  Subsequent treatment strategy for metastatic breast cancer. EXAM: NUCLEAR MEDICINE PET SKULL BASE TO THIGH TECHNIQUE: 10.1 mCi F-18 FDG was injected intravenously. Full-ring PET imaging was performed from the skull base to thigh after the radiotracer. CT data was obtained and used for attenuation correction and anatomic localization. FASTING BLOOD GLUCOSE:  Value: 79 mg/dl COMPARISON:  CT  head/chest dated 03/13/2015. PET-CT dated 10/30/2014. FINDINGS: NECK No hypermetabolic lymph nodes in the neck. 2.0 cm lytic lesion in the left parietal bone (series 4/ image 12), although poorly evaluated on PET given blooming related to brain activity. CHEST 11 mm central right upper lobe nodule (series 8/ image 26), previously 10 mm, max SUV 2.9. Radiation changes in the medial left upper lobe/ paramediastinal region. Extensive pleural-based nodularity in the left hemithorax, max SUV 9.6 posteriorly in the left lower lobe. Associated small left pleural effusion with associated hypermetabolism. Heart is normal in size. No pericardial effusion. Atherosclerotic calcifications of the aortic arch. Postsurgical changes related to prior right axillary lymph node dissection. Thoracic lymphadenopathy, including: --11 mm short axis left axillary node (series 4/ image 85), max SUV 11.2, previously 11.1 --Left IMA node, max SUV 8.6, previously 2.2 --8 mm short axis AP  window node (series 4/ image 87), max SUV 5.2, previously 2.3 --10 mm short axis left subcarinal node (series 4/image 93), max SUV 11.1, previously 2.9 ABDOMEN/PELVIS 2.4 cm hypoenhancing lesion laterally in the posterior right hepatic dome (series 4/ image 123), max SUV 4.8, new. Additional 2.1 cm lesion inferiorly in the posterior right hepatic lobe (series 4/ image 130), max SUV 9.6, previously 3.7. 1.5 cm short axis node medial to the left kidney (series 4/ image 138), max SUV 9.7, previously 8.0. SKELETON Soft tissue metastasis in the lateral left pectoralis musculature, max SUV 3.5, previously 2.7. Osseous metastasis involving the right L2 transverse process, max SUV 5.6, new. Left sacral metastasis, max SUV 5.9, new. IMPRESSION: Findings have progressed from prior PET-CT. Radiation changes in the left upper lobe/paramediastinal region. Pleural-based nodularity in the left hemithorax. Associated left pleural effusion, likely malignant. 11 mm right upper  lobe pulmonary metastasis, mildly progressed. Thoracic nodal metastases. Additional upper abdominal nodal metastasis medial to the left kidney. Two right hepatic metastases, mildly progressed. Soft tissue and osseous metastases, as described above, progressed/new. Electronically Signed   By: Julian Hy M.D.   On: 03/19/2015 16:55   US Biopsy  03/20/2015  CLINICAL DATA:  Progression of hypermetabolic hepatic metastatic disease. Breast carcinoma. EXAM: ULTRASOUND-GUIDED CORE LIVER BIOPSY TECHNIQUE: An ultrasound guided liver biopsy was thoroughly discussed with the patient and questions were answered. The benefits, risks, alternatives, and complications were also discussed. The patient understands and wishes to proceed with the procedure. A verbal as well as written consent was obtained. Survey ultrasound of the liver was performed, a representative right lesion localized, and an appropriate skin entry site was determined. Skin site was marked, prepped with Betadine, and draped in usual sterile fashion, and infiltrated locally with 1% lidocaine. Intravenous Fentanyl and Versed were administered as conscious sedation during continuous cardiorespiratory monitoring by the radiology RN, with a total moderate sedation time of 12 minutes. A 17 gauge trocar needle was advanced under ultrasound guidance into the liver. 3 coaxial 18gauge core samples were then obtained through the guide needle. The guide needle was removed. Post procedure scans demonstrate no apparent complication. COMPLICATIONS: COMPLICATIONS None immediate FINDINGS: Multiple right lobe liver lesions localized. Ultrasound-guided percutaneous core biopsy samples of the dominant lesion obtained. IMPRESSION: 1. Technically successful ultrasound guided core liver lesion biopsy. Electronically Signed   By: Lucrezia Europe M.D.   On: 03/20/2015 14:32   Impression: The patient is a 54 year old female with a history of Stage IV breast cancer. On my review of  her recent PET, she has a large right inferior pubic ramus metastasis which may benefit from palliative radiation. The proximity of the target area to the genitalia may warrant sterotactic setup and delivery.  Plan: Today, I talked to the patient and family about the findings and work-up thus far.  We discussed the natural history of pelvic metastasis and general treatment, highlighting the role of radiotherapy in the management.  We discussed the available radiation techniques, and focused on the details of logistics and delivery.  We reviewed the anticipated acute and late sequelae associated with radiation in this setting.  The patient was encouraged to ask questions that I answered to the best of my ability.  I filled out a patient counseling form during our discussion including treatment diagrams.  We retained a copy for our records.  The patient would like to proceed with radiation and will be scheduled for CT simulation.  I spent 60 minutes minutes face to  face with the patient and more than 50% of that time was spent in counseling and/or coordination of care.  _____________________________________  Sheral Apley. Tammi Klippel, M.D.   This document serves as a record of services personally performed by Tyler Pita, MD. It was created on his behalf by Derek Mound, a trained medical scribe. The creation of this record is based on the scribe's personal observations and the provider's statements to them. This document has been checked and approved by the attending provider.

## 2015-03-27 NOTE — Progress Notes (Signed)
See progress note under physician encounter. 

## 2015-03-28 DIAGNOSIS — C7951 Secondary malignant neoplasm of bone: Secondary | ICD-10-CM | POA: Diagnosis present

## 2015-03-28 DIAGNOSIS — C50919 Malignant neoplasm of unspecified site of unspecified female breast: Secondary | ICD-10-CM | POA: Diagnosis not present

## 2015-03-28 DIAGNOSIS — Z510 Encounter for antineoplastic radiation therapy: Secondary | ICD-10-CM | POA: Diagnosis present

## 2015-03-29 ENCOUNTER — Ambulatory Visit
Admission: RE | Admit: 2015-03-29 | Discharge: 2015-03-29 | Disposition: A | Discharge status: Home / Self Care | Payer: 59 | Source: Ambulatory Visit | Attending: Radiation Oncology | Admitting: Radiation Oncology

## 2015-03-29 DIAGNOSIS — M25551 Pain in right hip: Secondary | ICD-10-CM | POA: Diagnosis not present

## 2015-03-30 ENCOUNTER — Ambulatory Visit: Payer: 59 | Admitting: Radiation Oncology

## 2015-03-30 ENCOUNTER — Ambulatory Visit: Payer: 59 | Admitting: Physical Therapy

## 2015-03-30 DIAGNOSIS — I890 Lymphedema, not elsewhere classified: Secondary | ICD-10-CM | POA: Diagnosis not present

## 2015-03-30 DIAGNOSIS — M25551 Pain in right hip: Secondary | ICD-10-CM

## 2015-03-30 DIAGNOSIS — M6289 Other specified disorders of muscle: Secondary | ICD-10-CM

## 2015-03-30 NOTE — Therapy (Signed)
[place], [place], [address] Phone: [phone]   Fax:  [phone]  Physical Therapy Treatment  Patient Details  Name: [name] [name] MRN: [id number] Date of Birth: 12/22/1960 Referring Provider: Dr. Lurline Del  Encounter Date: 03/30/2015      PT End of Session - 03/30/15 1218    Visit Number 108   Number of Visits 110   Date for PT Re-Evaluation 03/31/15  for lymphedema   PT Start Time 1025   PT Stop Time 1110   PT Time Calculation (min) 45 min   Activity Tolerance Patient tolerated treatment well   Behavior During Therapy [facility] for tasks assessed/performed      Past Medical History  Diagnosis Date  . Seizures ([place]) 2010    Isolated incident.  Marland Kitchen PONV (postoperative nausea and vomiting)   . Peripheral vascular disease ([place]) 02/2010    blood clot related to porta cath  . S/P radiation therapy 07/17/2014 through 08/02/2014     Left mediastinum, left seventh rib 3250 cGy in 13 sessions   . S/P radiation therapy 12/11/2014 through 12/22/2014     Left parietal calvarium 2400 cGy in 8 sessions   . Breast cancer ([place]) dx'd 2005/2011  . Bone metastases ([employer]) dx'd 05/2014    Past Surgical History  Procedure Laterality Date  . Breast lumpectomy  2005  . Axillary lymph node dissection  Dec. 2011  . Portacath placement  12/11  . Removal portacath    . Mediastinotomy chamberlain mcneil Left 06/02/2013    Procedure: MEDIASTINOTOMY CHAMBERLAIN MCNEIL;  Surgeon: [name], MD;  Location: [place];  Service: Thoracic;  Laterality: Left;  LEFT ANTERIOR MEDIASTINOTOMY     There were no vitals filed for this visit.  Visit Diagnosis:  Lymphedema  Muscle stiffness  Pain in joint involving right pelvic region and thigh       Subjective Assessment - 03/30/15 1028    Subjective Had a good Christmas.  But goes on to report on the results of recent CT and PET scans and liver biopsy, reporting new metastases and that she is starting radiation therapy for her right pubic ramus, which is most likely the cause of her right groin pain.   Currently in Pain? Yes   Pain Score 5    Pain Location Axilla   Pain Orientation Right   Multiple Pain Sites Yes   Pain Score 8   Pain Location Leg   Pain Orientation Right                         [facility] Adult PT Treatment/Exercise - 03/30/15 0001    Manual Therapy   Myofascial Release right axilla with focus at superior scar tightness   Manual Lymphatic Drainage (MLD) In left sidelying, posterior interaxillary anastomosis and right axillo-inguinal anastomosis; in supine, short neck, left axilla and anterior interaxillary anastomosis, right groin and axillo-inguinal anastomosis, area between right breast scars, directing toward pathways.  In right sidelying, left periscapular area toward left groin.   Other Manual Therapy soft tissue work to right thigh in right sidelying, for anteriomedial, medial, and posteriomedial aspects today                  PT Short Term Goals - 02/27/15 1011    PT SHORT TERM GOAL #1   Title pain with walking decreased >/= 25%   Time 3   Period Weeks   Status Achieved  PT SHORT TERM GOAL #2   Title pelvis stays in correct alignment for 3 weeks   Time 3   Period Weeks   Status Achieved           PT Long Term Goals - 03/21/15 1312    PT LONG TERM GOAL #1   Title (p) indpendent with HEP   Time (p) 6   Period (p) Weeks   Status (p) On-going   PT LONG TERM GOAL #2   Title (p) pain with walking decreased >/= 80%   Period (p) Weeks   Status (p) --  50% better   PT LONG TERM GOAL #3   Title (p) go up and down steps with a step over step pattern   Time (p) 6   Period (p) Weeks   Status (p) On-going  step to step due  to pain           Long Term Clinic Goals - 03/30/15 1224    CC Long Term Goal  #1   Title Reports left upper back and left side pain is controlled at 4/10 or less with therapy at the current frequency.   Status On-going   CC Long Term Goal  #2   Title Pt. will report swelling is adequately managed to enable ADL function at a consistent level.   Status On-going   CC Long Term Goal  #3   Title Pain in area of right ischial tuberosity will be reduced to 1/10 or less, and patient will no longer need assistive device for ambulation.   Baseline Deferred due to discovery of new metastases.   Status Deferred            Plan - 03/30/15 1220    Clinical Impression Statement Patient with newly identified metastases, including bony metastases to sacram and right pubic ramus, which account for right groin pain (and which cause muscle spasm in that area).  Still benefitting from manual lymph drainage treatment for ongoing swelling at right lateral breast/axilla/upper arm area.   Pt will benefit from skilled therapeutic intervention in order to improve on the following deficits Pain;Increased edema   Rehab Potential Good   Clinical Impairments Affecting Rehab Potential active cancer   PT Frequency 2x / week   PT Duration 8 weeks   PT Treatment/Interventions Manual lymph drainage;Manual techniques   PT Next Visit Plan Will need renewal next visit; for lymphedema, manual lymph drainage and soft tissue work   Consulted and Agree with Plan of Care Patient        Problem List Patient Active Problem List   Diagnosis Date Noted  . Nausea with vomiting 11/18/2014  . Constipation 11/18/2014  . Left-sided thoracic back pain   . Bone metastases ([place]) 11/16/2014  . Back pain 11/15/2014  . 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 Uncontrolled pain 11/14/2014  . 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 Chest wall pain 03/21/2014  . Abnormal LFTs (liver  function tests) 09/12/2013  . Pleural effusion 08/18/2013  . Breast cancer of upper-inner quadrant of right female breast ([place]) 08/18/2013  . 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

## 2015-04-03 ENCOUNTER — Ambulatory Visit
Admission: RE | Admit: 2015-04-03 | Discharge: 2015-04-03 | Disposition: A | Discharge status: Home / Self Care | Payer: 59 | Source: Ambulatory Visit | Attending: Radiation Oncology | Admitting: Radiation Oncology

## 2015-04-03 ENCOUNTER — Ambulatory Visit: Payer: 59 | Attending: Oncology | Admitting: Physical Therapy

## 2015-04-03 ENCOUNTER — Encounter: Payer: Self-pay | Admitting: Physical Therapy

## 2015-04-03 ENCOUNTER — Ambulatory Visit: Payer: 59 | Admitting: Radiation Oncology

## 2015-04-03 DIAGNOSIS — G729 Myopathy, unspecified: Secondary | ICD-10-CM | POA: Insufficient documentation

## 2015-04-03 DIAGNOSIS — R1031 Right lower quadrant pain: Secondary | ICD-10-CM | POA: Insufficient documentation

## 2015-04-03 DIAGNOSIS — M25551 Pain in right hip: Secondary | ICD-10-CM | POA: Diagnosis present

## 2015-04-03 DIAGNOSIS — Z510 Encounter for antineoplastic radiation therapy: Secondary | ICD-10-CM | POA: Diagnosis not present

## 2015-04-03 DIAGNOSIS — M6289 Other specified disorders of muscle: Secondary | ICD-10-CM

## 2015-04-03 NOTE — Therapy (Signed)
[facility] [address], [place], [place], [address] Phone: [phone]   Fax:  [phone]  Physical Therapy Treatment  Patient Details  Name: [name] [name] MRN: [id number] Date of Birth: Aug 10, 1960 Referring Provider: Dr. Lurline Del  Encounter Date: 04/03/2015      PT End of Session - 04/03/15 1313    Visit Number 109  pelvis2/24/17   Date for PT Re-Evaluation 05/25/15   Authorization - Visit Number 27   Authorization - Number of Visits 1   PT Start Time 1230   PT Stop Time 1310   PT Time Calculation (min) 40 min   Activity Tolerance Patient tolerated treatment well   Behavior During Therapy [facility] for tasks assessed/performed      Past Medical History  Diagnosis Date  . Seizures ([place]) 2010    Isolated incident.  Marland Kitchen PONV (postoperative nausea and vomiting)   . Peripheral vascular disease ([place]) 02/2010    blood clot related to porta cath  . S/P radiation therapy 07/17/2014 through 08/02/2014     Left mediastinum, left seventh rib 3250 cGy in 13 sessions   . S/P radiation therapy 12/11/2014 through 12/22/2014     Left parietal calvarium 2400 cGy in 8 sessions   . Breast cancer ([place]) dx'd 2005/2011  . Bone metastases ([place]) dx'd 05/2014    Past Surgical History  Procedure Laterality Date  . Breast lumpectomy  2005  . Axillary lymph node dissection  Dec. 2011  . Portacath placement  12/11  . Removal portacath    . Mediastinotomy chamberlain mcneil Left 06/02/2013    Procedure: MEDIASTINOTOMY CHAMBERLAIN MCNEIL;  Surgeon: [name], MD;  Location: [place];  Service: Thoracic;  Laterality: Left;  LEFT ANTERIOR MEDIASTINOTOMY     There were no vitals filed for this visit.  Visit Diagnosis:  Muscle stiffness  Pain in joint involving right  pelvic region and thigh  Groin pain, right      Subjective Assessment - 04/03/15 1231    Subjective Dr. Stark Jock said it will be good for me to continue to have therapy while the inflammation is going down.  I have radiation later today. My husband has been trying to do the soft tissue work but has not been as helpful.    Pertinent History patient vomited just prior to treatment today   How long can you walk comfortably? make patient limp   Patient Stated Goals walking with minimal pain and to get relief from lymphedema at axilla and anterior chest    Currently in Pain? Yes   Pain Score 7    Pain Location Groin   Pain Type Chronic pain   Pain Radiating Towards down right knee   Pain Onset More than a month ago   Pain Frequency Constant   Aggravating Factors  walking   Pain Relieving Factors soft tissue work   Effect of Pain on Daily Activities difficulty waking   Multiple Pain Sites No                         [facility] Adult PT Treatment/Exercise - 04/03/15 0001    Manual Therapy   Manual Therapy Soft tissue mobilization   Soft tissue mobilization right quad, iliotibial band, hamstring, around  right greater trochanter, right buttocks, right iliotibial band   Internal Pelvic Floor right obturator internist, right puborectalis, right iliococcygeus  PT Education - 04/03/15 1315    Education provided No          PT Short Term Goals - 02/27/15 1011    PT SHORT TERM GOAL #1   Title pain with walking decreased >/= 25%   Time 3   Period Weeks   Status Achieved   PT SHORT TERM GOAL #2   Title pelvis stays in correct alignment for 3 weeks   Time 3   Period Weeks   Status Achieved           PT Long Term Goals - 03/21/15 1312    PT LONG TERM GOAL #1   Title (p) indpendent with HEP   Time (p) 6   Period (p) Weeks   Status (p) On-going   PT LONG TERM GOAL #2   Title (p) pain with walking decreased >/= 80%   Period (p) Weeks   Status  (p) --  50% better   PT LONG TERM GOAL #3   Title (p) go up and down steps with a step over step pattern   Time (p) 6   Period (p) Weeks   Status (p) On-going  step to step due to pain           Long Term Clinic Goals - 03/30/15 1224    CC Long Term Goal  #1   Title Reports left upper back and left side pain is controlled at 4/10 or less with therapy at the current frequency.   Status On-going   CC Long Term Goal  #2   Title Pt. will report swelling is adequately managed to enable ADL function at a consistent level.   Status On-going   CC Long Term Goal  #3   Title Pain in area of right ischial tuberosity will be reduced to 1/10 or less, and patient will no longer need assistive device for ambulation.   Baseline Deferred due to discovery of new metastases.   Status Deferred            Plan - 04/03/15 1309    Clinical Impression Statement Patient reports her pain decreased to 3/10 after soft tissue work and has 50% less limp.  Patient cancer has metastaized into the right pubis rami causing inflammation  and pain in the right groin.  Patient would benefit from physical therapy to manage the pain to reduce limping and inlammation as she is getting radiation inot hte area.    Pt will benefit from skilled therapeutic intervention in order to improve on the following deficits Abnormal gait;Difficulty walking;Pain;Decreased strength;Decreased activity tolerance   Rehab Potential Good   Clinical Impairments Affecting Rehab Potential active cancer   PT Frequency 2x / week   PT Duration 8 weeks   PT Treatment/Interventions Therapeutic activities;Therapeutic exercise;Manual techniques;Patient/family education;Neuromuscular re-education   PT Next Visit Plan soft tissue work   PT Home Exercise Plan progress as needed   Consulted and Agree with Plan of Care Patient   PT Plan Continue manual lymph drainage, soft tissue work.        Problem List Patient Active Problem List    Diagnosis Date Noted  . Nausea with vomiting 11/18/2014  . Constipation 11/18/2014  . Left-sided thoracic back pain   . Bone metastases ([place]) 11/16/2014  . Back pain 11/15/2014  . 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 Uncontrolled pain 11/14/2014  . 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 Chest wall pain 03/21/2014  . Abnormal LFTs (  liver function tests) 09/12/2013  . Pleural effusion 08/18/2013  . Breast cancer of upper-inner quadrant of right female breast ([place]) 08/18/2013  . 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 [place], [place], [address] Phone: [phone]   Fax:  [phone]  Name: SHELETHA FONVILLE MRN: [id number] Date of Birth: [birth year]

## 2015-04-04 ENCOUNTER — Telehealth: Payer: Self-pay | Admitting: *Deleted

## 2015-04-04 ENCOUNTER — Other Ambulatory Visit: Payer: Self-pay | Admitting: *Deleted

## 2015-04-04 ENCOUNTER — Encounter: Payer: Self-pay | Admitting: Pharmacist

## 2015-04-04 ENCOUNTER — Ambulatory Visit (HOSPITAL_BASED_OUTPATIENT_CLINIC_OR_DEPARTMENT_OTHER): Payer: 59 | Admitting: Oncology

## 2015-04-04 ENCOUNTER — Other Ambulatory Visit: Payer: Self-pay | Admitting: Oncology

## 2015-04-04 DIAGNOSIS — C50211 Malignant neoplasm of upper-inner quadrant of right female breast: Secondary | ICD-10-CM | POA: Diagnosis not present

## 2015-04-04 DIAGNOSIS — B029 Zoster without complications: Secondary | ICD-10-CM

## 2015-04-04 MED ORDER — VALACYCLOVIR HCL 1 G PO TABS: 1000.0000 mg | ORAL_TABLET | Freq: Three times a day (TID) | ORAL | Status: DC

## 2015-04-04 NOTE — Progress Notes (Signed)
Oral Chemotherapy Pharmacist Encounter   I spoke with patient for overview of new oral chemotherapy medication: Ibrance. Pt is doing well. The prescription has been sent to the [facility] and the copay is $0. Patient took first dose yesterday on 1/3 (See Dr. Virgie Dad office note for dosing and history). Pt is very sensitive to medications and has had issues in the past with afinitor and others. Plan is to take 75 mg once weekly and check labs and titrate up if able. Pt complains of tight throat 1 hour after taking first Ibrance tablet ("felt like the Ibrance was stuck in my throat). And small rash on waist. She was evaluated by Dr. Jana Hakim today on 1/4. Rash was felt to be shingles. Pt now on Valtrex x 7 days for shingles. Will follow up labs next week for Ibrance tolerability  Counseled patient on administration, dosing, side effects, safe handling, and monitoring. Side effects include but not limited to: Myelosuppression, fatigue, nausea, vomiting, diarrhea, stomatitis, alopecia, and peripheral neuropathy.  Ms. Vasseur voiced understanding and appreciation.   All questions answered.  Will follow up with patient regarding insurance and pharmacy. Will follow up in 1 week for adherence and toxicity management.   Thank you,  Montel Clock, PharmD, [facility]

## 2015-04-04 NOTE — Progress Notes (Signed)
[name] [name] came today for an unscheduled visit. She took her first Palbociclib dose yesterday. Today she developed a rash in the left lower quadrant. She tells me that she has been reading in the Internet and finds that a rash in the lower abdomen is extremely common with Ibrance. She wondered why no one told her about this.  She was also concerned because every time one of her scans as read by different physician they noticed different things in the prior scans which were not mentioned before. This makes her feel like people are not paying attention and that we are using misleading or inadequate information in making decisions regarding her care.  I did a physical exam especially focused on the rash in the left lower quadrant it is very focal over an area of 3 x 3 cm, and it consists of red vesicular lesions entirely consistent with zoster.  It was difficult for [name] [name] to accept that this was not going to be related to the Palbociclib as I believe it is not. She did agree to start valacyclovir 1000 mg 3 times a day which she will take for the next 7 days.  She is already scheduled to return on January 6, 2 days from now, to meet with radiation oncology, and she will see me briefly on that day to make sure the rash is beginning to involute.  I am not sure how to resolve the more complex issue on reading scans but I will discuss it with radiology.

## 2015-04-04 NOTE — Telephone Encounter (Signed)
Pt called to this RN on 1/3 stating she has started her [name] this am - " I am not having any problems except I kinda feel like the pill got stuck in my throat but I am ok "  Ashi is requesting to not receive xgeva on same day as fulvestrant but would like to come 1 week later.  Appointment per above put on POF and sent to scheduling.

## 2015-04-05 ENCOUNTER — Ambulatory Visit: Payer: 59 | Admitting: Physical Therapy

## 2015-04-05 ENCOUNTER — Telehealth: Payer: Self-pay | Admitting: Oncology

## 2015-04-05 ENCOUNTER — Ambulatory Visit: Payer: 59 | Admitting: Radiation Oncology

## 2015-04-05 ENCOUNTER — Encounter: Payer: Self-pay | Admitting: Physical Therapy

## 2015-04-05 DIAGNOSIS — M25551 Pain in right hip: Secondary | ICD-10-CM

## 2015-04-05 DIAGNOSIS — G729 Myopathy, unspecified: Secondary | ICD-10-CM | POA: Diagnosis not present

## 2015-04-05 DIAGNOSIS — R1031 Right lower quadrant pain: Secondary | ICD-10-CM

## 2015-04-05 DIAGNOSIS — M6289 Other specified disorders of muscle: Secondary | ICD-10-CM

## 2015-04-05 NOTE — Telephone Encounter (Addendum)
[name] [name] called and said she was diagnosed with shingles on her left front waist area yesterday.  She started taking valtrex yesterday.  She said she wanted to let us know since she is starting radiation tomorrow.

## 2015-04-05 NOTE — Telephone Encounter (Signed)
Per 1/4 pof leave fulvestrant as scheduled 1/11 and add injection appointment for xgeva 1/18 per patient request. Spoke with patient re 1/18 inj and confirmed 1/11. Other appointments remain the same.

## 2015-04-05 NOTE — Therapy (Signed)
[facility] [address], [place], [place], [address] Phone: [phone]   Fax:  [phone]  Physical Therapy Treatment  Patient Details  Name: [name] [name] MRN: [id number] Date of Birth: 15-Jun-1960 Referring Provider: Dr. Lurline Del  Encounter Date: 04/05/2015      PT End of Session - 04/05/15 1307    Visit Number 110  pelvis   Date for PT Re-Evaluation 05/25/15   Authorization - Visit Number 22   Authorization - Number of Visits 2   PT Start Time 1225   PT Stop Time 1308   PT Time Calculation (min) 43 min   Activity Tolerance Patient tolerated treatment well   Behavior During Therapy [facility] for tasks assessed/performed      Past Medical History  Diagnosis Date  . Seizures ([place]) 2010    Isolated incident.  Marland Kitchen PONV (postoperative nausea and vomiting)   . Peripheral vascular disease ([place]) 02/2010    blood clot related to porta cath  . S/P radiation therapy 07/17/2014 through 08/02/2014     Left mediastinum, left seventh rib 3250 cGy in 13 sessions   . S/P radiation therapy 12/11/2014 through 12/22/2014     Left parietal calvarium 2400 cGy in 8 sessions   . Breast cancer ([place]) dx'd 2005/2011  . Bone metastases ([place]) dx'd 05/2014    Past Surgical History  Procedure Laterality Date  . Breast lumpectomy  2005  . Axillary lymph node dissection  Dec. 2011  . Portacath placement  12/11  . Removal portacath    . Mediastinotomy chamberlain mcneil Left 06/02/2013    Procedure: MEDIASTINOTOMY CHAMBERLAIN MCNEIL;  Surgeon: [name], MD;  Location: [facility];  Service: Thoracic;  Laterality: Left;  LEFT ANTERIOR MEDIASTINOTOMY     There were no vitals filed for this visit.  Visit Diagnosis:  Muscle stiffness  Pain in joint involving right pelvic  region and thigh  Groin pain, right      Subjective Assessment - 04/05/15 1234    Subjective I have an area on my trunk that is raised and blistered.  Dr. Curlene Labrum feels it is shingles but could be reaction to medication.  I still have pain in the groin and walking.    Pertinent History patient vomited just prior to treatment today   How long can you walk comfortably? make patient limp   Patient Stated Goals walking with minimal pain and to get relief from lymphedema at axilla and anterior chest    Currently in Pain? Yes   Pain Score 7    Pain Orientation Right   Pain Descriptors / Indicators Pressure   Pain Type Chronic pain   Aggravating Factors  walking, night   Pain Relieving Factors rest   Effect of Pain on Daily Activities difficulty with walking   Multiple Pain Sites No                      Pelvic Floor Special Questions - 04/05/15 0001    Pelvic Floor Internal Exam Patient approves physical therapist to perform pelvic floor muscle assessment   Exam Type Vaginal           [facility] Adult PT Treatment/Exercise - 04/05/15 0001    Manual Therapy   Manual Therapy Soft tissue mobilization   Soft tissue mobilization right quad, iliotibial band, hip adductors, hamstring, around  right greater trochanter, right buttocks, right iliotibial band   Internal Pelvic Floor right obturator internist, right puborectalis, right  iliococcygeus                PT Education - 04/05/15 1305    Education provided No          PT Short Term Goals - 02/27/15 1011    PT SHORT TERM GOAL #1   Title pain with walking decreased >/= 25%   Time 3   Period Weeks   Status Achieved   PT SHORT TERM GOAL #2   Title pelvis stays in correct alignment for 3 weeks   Time 3   Period Weeks   Status Achieved           PT Long Term Goals - 03/21/15 1312    PT LONG TERM GOAL #1   Title (p) indpendent with HEP   Time (p) 6   Period (p) Weeks   Status (p) On-going   PT  LONG TERM GOAL #2   Title (p) pain with walking decreased >/= 80%   Period (p) Weeks   Status (p) --  50% better   PT LONG TERM GOAL #3   Title (p) go up and down steps with a step over step pattern   Time (p) 6   Period (p) Weeks   Status (p) On-going  step to step due to pain           Long Term Clinic Goals - 03/30/15 1224    CC Long Term Goal  #1   Title Reports left upper back and left side pain is controlled at 4/10 or less with therapy at the current frequency.   Status On-going   CC Long Term Goal  #2   Title Pt. will report swelling is adequately managed to enable ADL function at a consistent level.   Status On-going   CC Long Term Goal  #3   Title Pain in area of right ischial tuberosity will be reduced to 1/10 or less, and patient will no longer need assistive device for ambulation.   Baseline Deferred due to discovery of new metastases.   Status Deferred            Plan - 04/05/15 1305    Clinical Impression Statement patient reports her pain internally has decreased by 75%.  After treatment pain decreased to 4/10 with walking.  Patient pain is 4/10 with sitting.  Patient able to walk with less pain. Patient will benefit form physical therapy to reduce pain.    Pt will benefit from skilled therapeutic intervention in order to improve on the following deficits Abnormal gait;Difficulty walking;Pain;Decreased strength;Decreased activity tolerance   Rehab Potential Good   Clinical Impairments Affecting Rehab Potential active cancer   PT Frequency 2x / week   PT Duration 8 weeks   PT Treatment/Interventions Therapeutic activities;Therapeutic exercise;Manual techniques;Patient/family education;Neuromuscular re-education   PT Next Visit Plan soft tissue work   PT Home Exercise Plan progress as needed   Consulted and Agree with Plan of Care Patient   PT Plan Continue manual lymph drainage, soft tissue work.        Problem List Patient Active Problem List    Diagnosis Date Noted  . Zoster 04/04/2015  . Nausea with vomiting 11/18/2014  . Constipation 11/18/2014  . Left-sided thoracic back pain   . Bone metastases ([place]) 11/16/2014  . Back pain 11/15/2014  . 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 Uncontrolled pain 11/14/2014  . 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 Chest wall pain 03/21/2014  .  Abnormal LFTs (liver function tests) 09/12/2013  . Pleural effusion 08/18/2013  . Breast cancer of upper-inner quadrant of right female breast ([place]) 08/18/2013  . 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, [place], [place], [address] Phone: [phone]   Fax:  [phone]  Name: [name] [name] MRN: [id number] Date of Birth: Oct 15, 1960

## 2015-04-06 ENCOUNTER — Ambulatory Visit
Admission: RE | Admit: 2015-04-06 | Discharge: 2015-04-06 | Disposition: A | Discharge status: Home / Self Care | Payer: 59 | Source: Ambulatory Visit | Attending: Radiation Oncology | Admitting: Radiation Oncology

## 2015-04-06 ENCOUNTER — Encounter: Payer: Self-pay | Admitting: Radiation Oncology

## 2015-04-06 ENCOUNTER — Ambulatory Visit (HOSPITAL_BASED_OUTPATIENT_CLINIC_OR_DEPARTMENT_OTHER): Payer: 59 | Admitting: Oncology

## 2015-04-06 ENCOUNTER — Ambulatory Visit: Payer: 59 | Attending: Oncology | Admitting: Physical Therapy

## 2015-04-06 DIAGNOSIS — M545 Low back pain: Secondary | ICD-10-CM | POA: Insufficient documentation

## 2015-04-06 DIAGNOSIS — M25551 Pain in right hip: Secondary | ICD-10-CM | POA: Insufficient documentation

## 2015-04-06 DIAGNOSIS — M6289 Other specified disorders of muscle: Secondary | ICD-10-CM

## 2015-04-06 DIAGNOSIS — C7951 Secondary malignant neoplasm of bone: Secondary | ICD-10-CM

## 2015-04-06 DIAGNOSIS — R1030 Lower abdominal pain, unspecified: Secondary | ICD-10-CM

## 2015-04-06 DIAGNOSIS — B029 Zoster without complications: Secondary | ICD-10-CM

## 2015-04-06 DIAGNOSIS — I890 Lymphedema, not elsewhere classified: Secondary | ICD-10-CM

## 2015-04-06 DIAGNOSIS — G729 Myopathy, unspecified: Secondary | ICD-10-CM | POA: Insufficient documentation

## 2015-04-06 DIAGNOSIS — Z79811 Long term (current) use of aromatase inhibitors: Secondary | ICD-10-CM

## 2015-04-06 DIAGNOSIS — Z86718 Personal history of other venous thrombosis and embolism: Secondary | ICD-10-CM

## 2015-04-06 DIAGNOSIS — C50211 Malignant neoplasm of upper-inner quadrant of right female breast: Secondary | ICD-10-CM

## 2015-04-06 DIAGNOSIS — Z510 Encounter for antineoplastic radiation therapy: Secondary | ICD-10-CM | POA: Diagnosis not present

## 2015-04-06 NOTE — Therapy (Signed)
[place], [place], [address] Phone: [phone]   Fax:  [phone]  Physical Therapy Treatment  Patient Details  Name: [name] [name] MRN: [id number] Date of Birth: [birth year] Referring Provider: Dr. Lurline Del  Encounter Date: 04/06/2015      PT End of Session - 04/06/15 1253    Visit Number 3  starting count from 04/01/15   Number of Visits 24  for lymphedema   Date for PT Re-Evaluation 06/25/15  for lymphedema   Authorization - Visit Number 45   PT Start Time 0850   PT Stop Time 0932   PT Time Calculation (min) 42 min   Activity Tolerance Patient tolerated treatment well   Behavior During Therapy [facility] for tasks assessed/performed      Past Medical History  Diagnosis Date  . Seizures ([place]) 2010    Isolated incident.  Marland Kitchen PONV (postoperative nausea and vomiting)   . Peripheral vascular disease ([place]) 02/2010    blood clot related to porta cath  . S/P radiation therapy 07/17/2014 through 08/02/2014     Left mediastinum, left seventh rib 3250 cGy in 13 sessions   . S/P radiation therapy 12/11/2014 through 12/22/2014     Left parietal calvarium 2400 cGy in 8 sessions   . Breast cancer ([place]) dx'd 2005/2011  . Bone metastases ([place]) dx'd 05/2014    Past Surgical History  Procedure Laterality Date  . Breast lumpectomy  2005  . Axillary lymph node dissection  Dec. 2011  . Portacath placement  12/11  . Removal portacath    . Mediastinotomy chamberlain mcneil Left 06/02/2013    Procedure: MEDIASTINOTOMY CHAMBERLAIN MCNEIL;  Surgeon: [name], MD;  Location: [place];  Service: Thoracic;  Laterality: Left;  LEFT ANTERIOR MEDIASTINOTOMY     There were no vitals filed for this visit.  Visit Diagnosis:  Muscle stiffness  Pain in  joint involving right pelvic region and thigh  Lymphedema      Subjective Assessment - 04/06/15 0856    Subjective "Now I've got shingles."  Started Ibrance this week.  Doing radiation for pelvic bony metastases.  Saw Cheryl yesterday and she treated it, but now down my leg it hurts and is tight.   Currently in Pain? Yes   Pain Score 5    Pain Location Axilla   Pain Orientation Right   Aggravating Factors  increased swelling                         [facility] Adult PT Treatment/Exercise - 04/06/15 0001    Manual Therapy   Myofascial Release right axilla with focus at superior scar tightness   Manual Lymphatic Drainage (MLD) In left sidelying, posterior interaxillary anastomosis and right axillo-inguinal anastomosis; in supine, short neck, left axilla and anterior interaxillary anastomosis, right groin and axillo-inguinal anastomosis, area between right breast scars, directing toward pathways.  In right sidelying, left periscapular area toward left groin.   Other Manual Therapy soft tissue work to right thigh in right sidelying, for anteriomedial, medial, and posteriomedial aspects today                PT Education - 04/05/15 1305    Education provided No          PT Short Term Goals - 02/27/15 1011    PT SHORT TERM GOAL #1   Title pain with walking decreased >/= 25%   Time 3   Period Weeks  Status Achieved   PT SHORT TERM GOAL #2   Title pelvis stays in correct alignment for 3 weeks   Time 3   Period Weeks   Status Achieved           PT Long Term Goals - 03/21/15 1312    PT LONG TERM GOAL #1   Title (p) indpendent with HEP   Time (p) 6   Period (p) Weeks   Status (p) On-going   PT LONG TERM GOAL #2   Title (p) pain with walking decreased >/= 80%   Period (p) Weeks   Status (p) --  50% better   PT LONG TERM GOAL #3   Title (p) go up and down steps with a step over step pattern   Time (p) 6   Period (p) Weeks   Status (p) On-going   step to step due to pain           Long Term Clinic Goals - 04/06/15 1300    CC Long Term Goal  #1   Title Reports left upper back and left side pain is controlled at 4/10 or less with therapy at the current frequency.   Status On-going   CC Long Term Goal  #2   Title Pt. will report swelling is adequately managed to enable ADL function at a consistent level.   Status On-going            Plan - 04/06/15 1258    Clinical Impression Statement Patient with new case of shingles at left flank that interferes with her being able to use her Flexitouch pump because of the part of the garment that wraps around there.  Right lateral chest is full and firm today; patient also reports tightness in her right posterior thigh.  Therapy continues to alleviate some of her discomfort.   Pt will benefit from skilled therapeutic intervention in order to improve on the following deficits Increased edema;Increased muscle spasms   Rehab Potential Good   Clinical Impairments Affecting Rehab Potential active cancer   PT Frequency 2x / week   PT Duration 12 weeks   PT Treatment/Interventions Manual techniques;Manual lymph drainage   PT Next Visit Plan for lymphedema, continue manual lymph drainage; continue soft tissue work   Consulted and Agree with Plan of Care Patient        Problem List Patient Active Problem List   Diagnosis Date Noted  . [place] 04/04/2015  . Nausea with vomiting 11/18/2014  . Constipation 11/18/2014  . Left-sided thoracic back pain   . Bone metastases ([place]) 11/16/2014  . Back pain 11/15/2014  . 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 Uncontrolled pain 11/14/2014  . 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 Chest wall pain 03/21/2014  . Abnormal LFTs (liver function tests) 09/12/2013  . Pleural effusion 08/18/2013  . Breast cancer of upper-inner quadrant of right female breast ([place]) 08/18/2013  . 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

## 2015-04-06 NOTE — Progress Notes (Signed)
Ms. [name] [name] reports level 7-8 pain in the left posterior thigh internally.  Currently receiving pelvic floor rehab.   Shingles left lower back radiating to abdomen and started Valtrex  On Wed. Started [name]

## 2015-04-06 NOTE — Progress Notes (Signed)
  Radiation Oncology         [phone]   Name: SIOBAN LIEBOLD MRN: [id number]   Date: 04/06/2015  DOB: 1961-02-25   Weekly Radiation Therapy Management    ICD-9-CM ICD-10-CM   1. Bone metastases ([facility]) 198.5 C79.51     Current Dose: 18 Gy  Planned Dose:  24 Gy  Narrative The patient presents for routine under treatment assessment.  Ms. Olshansky reports level 7-8 pain in the left posterior thigh internally. Currently receiving pelvic floor rehab. Shingles left lower back radiating to abdomen and started Valtrex on Wednesday. Started [name] .  The patient is without complaint. Set-up films were reviewed. The chart was checked.  Physical Findings Weight essentially stable.  No significant changes. Patient does appear to have shingles confined to the left T9 dermatome.   Impression The patient is tolerating radiation.  Plan Continue treatment as planned.         Sheral Apley Tammi Klippel, M.D.  This document serves as a record of services personally performed by Tyler Pita, MD. It was created on his behalf by Arlyce Harman, a trained medical scribe. The creation of this record is based on the scribe's personal observations and the provider's statements to them. This document has been checked and approved by the attending provider.

## 2015-04-06 NOTE — Progress Notes (Signed)
[name]  [phone] [phone]     ID: [name] [name] OB: 07/10/1960  MR#: [id number]  OBS#:[id number]  PCP: Pcp Not In System GYN:  [name] SU:  OTHER MD: [name], [name], Jae Dire, [name], [name], [name]  CHIEF COMPLAINT: Stage IV breast cancer  CURRENT TREATMENT: Fulvestrant, (denosumab,) (palbociclib)  BREAST CANCER HISTORY: From doctor [name]'s intake note 03/20/2004:  "The patient is a very pleasant 55 year old female, without significant past medical history.  Her family history is significant for a sister who at age 16 was diagnosed with invasive ductal carcinoma.  She is a breast cancer survivor at age 35 now.  The patient states that she has never really had a screening mammogram until October 2005, when she felt that it was time for her to start having mammograms done on a yearly basis.  Therefore, on 01/26/04, she underwent a screening mammogram and an abnormality was detected in the upper outer right breast.  She, therefore, underwent spot compression views of both the right and the left breast.  The left breast revealed a well-defined mass in the upper outer left quadrant, present at the 2 o'clock position, measuring 1.8 cm, 6 cm from the nipple.  This, by ultrasound, was felt to be a simple cyst measuring 1.8 cm.  On the right breast, a spiculated mass was noted in the upper outer right quadrant.  The ultrasound revealed a shadowing irregular solid mass at the 10:30 position, 9 cm from the nipple, measuring 1.2 cm in greatest dimension, correlating with the spiculated mass seen on the mammogram.  The right axilla was negative ultrasonically.  Because of this, the patient underwent a needle biopsy of the right breast and the biopsy was positive invasive mammary carcinoma that showed features consistent with a high-grade invasive ductal carcinoma associated with desmoplastic stroma.   No in situ component was seen and no definite lymphovascular invasion was identified.  On the core biopsy, the tumor measured about 0.8 cm.  Because of this, she was seen by Dr. [name] and the patient was taken to the [facility] on March 15, 2004.  She underwent a right breast lumpectomy with sentinel node biopsy.  The final pathology revealed an invasive ductal carcinoma, measuring 1.7 cm, grade 2 of 3.  Margins were free of tumor.  Atypical lobular hyperplasia was noted.  One sentinel node was removed which was negative for metastatic disease.  The tumor was staged at T1c, N0 MX.  It was estrogen receptor positive, progesterone receptor positive.  HER-2/neu was 2+.  FISH was negative.  All margins were free of tumor.  She is now seen in Medical Oncology for further evaluation and management of this newly diagnosed T1c, node negative, stage I, invasive ductal carcinoma of the right breast."  Her subsequent history is as detailed below  INTERVAL HISTORY: [name] [name] returns today for follow-up of her zoster. 2 days ago she thought it might have been due to her Palbociclib but now she understands it really is soft or. She has been on Valtrex 3 times a day for the past 48 hours. She also continues to receive her radiation treatments today, today being the third of 4 planned treatments, the last one scheduled for 04/09/2015   REVIEW OF SYSTEMS: The pain in the groin area is really not much better. It has changed however. The bone portion of the pain seems to be improving. There is some radiation in the medial right  hamstring area and that is what very tight". She continues to receive physical therapy for this and that temporarily helps. She has only been taking 1 or 2 Aleve a day for this and she says that's enough to control it. She understands she can take 2 Aleve 3 times a day so long as she takes it with food. She is concerned because there are new little red spots in her left flank that she wanted me to  look at today she is tolerating the Valtrex with no side effects that she is aware of. Aside from these issues a detailed review of systems today was stable  PAST MEDICAL HISTORY: Past Medical History  Diagnosis Date  . Seizures ([place]) 2010    Isolated incident.  Marland Kitchen PONV (postoperative nausea and vomiting)   . Peripheral vascular disease ([place]) 02/2010    blood clot related to porta cath  . S/P radiation therapy 07/17/2014 through 08/02/2014     Left mediastinum, left seventh rib 3250 cGy in 13 sessions   . S/P radiation therapy 12/11/2014 through 12/22/2014     Left parietal calvarium 2400 cGy in 8 sessions   . Breast cancer ([place]) dx'd 2005/2011  . Bone metastases ([place]) dx'd 05/2014    PAST SURGICAL HISTORY: Past Surgical History  Procedure Laterality Date  . Breast lumpectomy  2005  . Axillary lymph node dissection  Dec. 2011  . Portacath placement  12/11  . Removal portacath    . Mediastinotomy chamberlain mcneil Left 06/02/2013    Procedure: MEDIASTINOTOMY CHAMBERLAIN MCNEIL;  Surgeon: [name], MD;  Location: [facility] OR;  Service: Thoracic;  Laterality: Left;  LEFT ANTERIOR MEDIASTINOTOMY     FAMILY HISTORY Family History  Problem Relation Age of Onset  . COPD Mother   . Breast cancer Sister 76   The patient's father is living, 85 years old as of may 2015. He lives in [place]. The patient's mother died from complications of COPD at the age of 34. These has 2 brothers, one sister. Her sister developed breast cancer at the age of 82. She is doing well. The patient herself underwent genetic testing at [facility] in 2011 and was found to be BRCA negative  GYNECOLOGIC HISTORY:  Menarche age 75, she is GX P0. She stopped having periods with her initial chemotherapy in 2006.  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 Her husband [name] is a Medical illustrator in [place]. He has a child from a prior marriage. At home they have 2 rescue dogs, Hobo and [place]. The patient is religious but not a church attender    ADVANCED DIRECTIVES: In place; at the 08/04/2014 visit in particular the patient was very clear, with her husband present, that she would not want any kind of feeding tubes or "other tubes" if her condition deteriorated.   HEALTH MAINTENANCE: Social History  Substance Use Topics  . Smoking status: Never Smoker   . Smokeless tobacco: Never Used  . Alcohol Use: No     Colonoscopy:  PAP:  Bone density: March 2015; mild osteopenia  Lipid panel:  Allergies  Allergen Reactions  . 2nd Skin Quick Heal Other (See Comments)    Other Reaction: Skin peels  . Decadron [Dexamethasone] Other (See Comments)    Patient does not tolerate steroids.   . Dilaudid [Hydromorphone] Nausea And Vomiting  . Enoxaparin Other (See Comments)    unknown  . Fluconazole  Swelling    Liver toxicity  . Hydromorphone Hcl Nausea And Vomiting  . Morphine And Related Nausea And Vomiting  . Protonix [Pantoprazole Sodium] Other (See Comments)    Patient reports it caused thrush.  . Tegaderm Ag Mesh [Silver]     Current Outpatient Prescriptions  Medication Sig Dispense Refill  . acetaminophen (TYLENOL) 160 mg/5 mL SOLN Reported on 03/27/2015    . ALPRAZolam (XANAX) 0.5 MG tablet Take 1 tablet (0.5 mg total) by mouth 2 (two) times daily as needed for anxiety. 30 tablet 0  . B Complex-C (B-COMPLEX WITH VITAMIN C) tablet Take 1 tablet by mouth daily. Reported on 03/27/2015    . cholecalciferol 2000 UNITS tablet Take 1 tablet (2,000 Units total) by mouth daily.    . folic acid (FOLVITE) 1 MG tablet Take 1 tablet (1 mg total) by mouth daily.    Marland Kitchen ibuprofen (ADVIL,MOTRIN) 200 MG tablet Take 400 mg by mouth every 4 (four) hours as needed for moderate pain. Reported on  03/27/2015    . magnesium chloride (SLOW-MAG) 64 MG TBEC SR tablet Take 60 mg by mouth daily.    . Melatonin 3 MG TABS Take 3 mg by mouth at bedtime.    . naproxen sodium (ANAPROX) 220 MG tablet Take 220 mg by mouth 2 (two) times daily with a meal.    . palbociclib (IBRANCE) 75 MG capsule Take 1 capsule (75 mg total) by mouth daily with breakfast. Take whole with food. 11 capsule 0  . saccharomyces boulardii (FLORASTOR) 250 MG capsule Take 250 mg by mouth daily.     . valACYclovir (VALTREX) 1000 MG tablet Take 1 tablet (1,000 mg total) by mouth 3 (three) times daily. 21 tablet 0   No current facility-administered medications for this visit.    OBJECTIVE: Middle-aged white woman who appears stated age There were no vitals filed for this visit.   There is no weight on file to calculate BMI.   There were no vitals filed for this visit.    Patient refused vitals today (04/05/2014    ECOG FS:1 - Symptomatic but completely ambulatory  Sclerae unicteric, pupils round and equal Oropharynx clear and moist-- no thrush or other lesions No cervical or supraclavicular adenopathy MSK no focal spinal tenderness, no upper extremity lymphedema Neuro: nonfocal, well oriented, appropriate affect Breasts: deferred Skin: The area of zoster in the left lower her quadrant is a little bit more vesicular. It has not yet started to crossed. Compared to 2 days ago there is a new focus a little bit more lateral and superiorly, along the same dermatome. There is no coughing outside the dermatome on and specifically nothing across the midline   LAB RESULTS:   CMP     Component Value Date/Time   NA 138 03/13/2015 1230   NA 140 12/14/2014 0800   K 4.1 03/13/2015 1230   K 3.6 12/14/2014 0800   CL 103 12/14/2014 0800   CL 105 05/06/2012 1333   CO2 24 03/13/2015 1230   CO2 28 12/14/2014 0800   GLUCOSE 80 03/13/2015 1230   GLUCOSE 100* 12/14/2014 0800   GLUCOSE 124* 05/06/2012 1333   BUN 10.9 03/13/2015  1230   BUN 6 12/14/2014 0800   CREATININE 0.8 03/13/2015 1230   CREATININE 0.64 12/14/2014 0800   CALCIUM 10.1 03/13/2015 1230   CALCIUM 9.6 12/14/2014 0800   PROT 7.9 03/13/2015 1230   PROT 5.6* 11/20/2014 0433   ALBUMIN 3.9 03/13/2015 1230   ALBUMIN 2.6* 11/20/2014  0433   AST 37* 03/13/2015 1230   AST 28 11/20/2014 0433   ALT 53 03/13/2015 1230   ALT 62* 11/20/2014 0433   ALKPHOS 126 03/13/2015 1230   ALKPHOS 105 11/20/2014 0433   BILITOT 0.35 03/13/2015 1230   BILITOT 0.7 11/20/2014 0433   GFRNONAA >60 12/14/2014 0800   GFRAA >60 12/14/2014 0800    No results found for: SPEP  Lab Results  Component Value Date   WBC 6.6 03/20/2015   NEUTROABS 5.6 03/13/2015   HGB 13.3 03/20/2015   HCT 39.3 03/20/2015   MCV 93.6 03/20/2015   PLT 396 03/20/2015      Chemistry      Component Value Date/Time   NA 138 03/13/2015 1230   NA 140 12/14/2014 0800   K 4.1 03/13/2015 1230   K 3.6 12/14/2014 0800   CL 103 12/14/2014 0800   CL 105 05/06/2012 1333   CO2 24 03/13/2015 1230   CO2 28 12/14/2014 0800   BUN 10.9 03/13/2015 1230   BUN 6 12/14/2014 0800   CREATININE 0.8 03/13/2015 1230   CREATININE 0.64 12/14/2014 0800      Component Value Date/Time   CALCIUM 10.1 03/13/2015 1230   CALCIUM 9.6 12/14/2014 0800   ALKPHOS 126 03/13/2015 1230   ALKPHOS 105 11/20/2014 0433   AST 37* 03/13/2015 1230   AST 28 11/20/2014 0433   ALT 53 03/13/2015 1230   ALT 62* 11/20/2014 0433   BILITOT 0.35 03/13/2015 1230   BILITOT 0.7 11/20/2014 0433       Lab Results  Component Value Date   LABCA2 25 11/02/2007    No components found for: [id number]  No results for input(s): INR in the last 168 hours.  Urinalysis    Component Value Date/Time   COLORURINE YELLOW 11/17/2014 0143   APPEARANCEUR CLOUDY* 11/17/2014 0143   LABSPEC 1.010 11/17/2014 0143   LABSPEC 1.005 09/12/2013 1542   PHURINE 6.5 11/17/2014 0143   PHURINE 6.0 09/12/2013 1542   GLUCOSEU NEGATIVE 11/17/2014 0143    GLUCOSEU Negative 09/12/2013 1542   HGBUR NEGATIVE 11/17/2014 0143   HGBUR Negative 09/12/2013 1542   BILIRUBINUR NEGATIVE 11/17/2014 0143   BILIRUBINUR Negative 09/12/2013 1542   KETONESUR NEGATIVE 11/17/2014 0143   KETONESUR Negative 09/12/2013 1542   PROTEINUR NEGATIVE 11/17/2014 0143   PROTEINUR Negative 09/12/2013 1542   UROBILINOGEN 0.2 11/17/2014 0143   UROBILINOGEN 0.2 09/12/2013 1542   NITRITE NEGATIVE 11/17/2014 0143   NITRITE Negative 09/12/2013 1542   LEUKOCYTESUR NEGATIVE 11/17/2014 0143   LEUKOCYTESUR Negative 09/12/2013 1542    STUDIES: Ct Head W Wo Contrast  03/13/2015  CLINICAL DATA:  Stage IV breast cancer EXAM: CT HEAD WITHOUT AND WITH CONTRAST TECHNIQUE: Contiguous axial images were obtained from the base of the skull through the vertex without and with intravenous contrast CONTRAST:  75 mL Omnipaque 300 IV E COMPARISON:  MRI 10/18/2014 FINDINGS: Ventricle size normal.  Cerebral volume normal. Negative for acute or chronic infarction. Negative for intracranial mass or edema.  Negative for hemorrhage. Postcontrast imaging of the brain reveals no enhancing lesion. Vascular enhancement normal. Left parietal skull lesion measures 20 mm and is unchanged from the MRI of 10/18/2014. This enhances on MRI and is compatible with metastatic disease. This was not present in 2012. Right frontal lesion over the convexity measures 11 mm was not present on the recent MRI. This also is most likely due to metastatic disease. IMPRESSION: Negative for metastatic disease to the brain 20 mm left parietal bone  lesion. This is unchanged. New 11 mm right frontal bone lesion consistent with metastatic disease. Electronically Signed   By: Franchot Gallo M.D.   On: 03/13/2015 15:14   Ct Chest W Contrast  03/13/2015  CLINICAL DATA:  Stage IV breast cancer. Subsequent treatment evaluation EXAM: CT CHEST WITH CONTRAST TECHNIQUE: Multidetector CT imaging of the chest was performed during intravenous  contrast administration. CONTRAST:  53m OMNIPAQUE IOHEXOL 300 MG/ML  SOLN COMPARISON:  CT 12/26/2014, PET-CT 10/30/2014 FINDINGS: Mediastinum/Nodes: Enlarged lymph node in the medial LEFT axilla measures 11 mm (image 14, series 4) compared to 11 mm. No supraclavicular adenopathy. Small paratracheal lymph nodes are unchanged measuring up to 8 mm. Subcarinal node measures 8 mm unchanged. No central pulmonary embolism. Lungs/Pleura: There is volume loss in the LEFT hemi thorax. There is consolidation with air bronchograms and bronchiectasis in the medial LEFT upper lobe not changed. Focus of round atelectasis at the LEFT lung base is unchanged. No new pulmonary nodularity. There is nodularity within the pleural space inferiorly in the LEFT hemi thorax similar to comparison exam (Image 38, series 4). There is metastatic lymph nodes in the precordial space adjacent to the LEFT ventricle. For example 12 mm node on image 37, series 4 compares to 12 mm. Upper abdomen: Progression of liver lesions. Lesion in the RIGHT hepatic lobe is increased in size with peripheral enhancement measuring 17 mm (image 47, series 4) compared to 9 mm on prior remeasured. More centrally in the RIGHT hepatic lobe 11 mm lesion on image 53 compares to 7 mm. Lesions central LEFT hepatic lobe on image 50 measures 15 mm not changed. There is thickening of the LEFT crus of the diaphragm to 2.5 cm not changed from prior. Musculoskeletal: Paraspinal lesion at T11 measures 16 mm (image 43, series 4 compared to 17 mm. Sclerotic lesion at T1 similar to comparison exam. Sclerotic lesion in the medial LEFT clavicle is unchanged. IMPRESSION: 1. Interval enlargement of hepatic metastasis. 2. Essentially stable pleural disease in the LEFT hemi thorax with extension to the precordial space. 3. Stable paravertebral lesion adjacent to the T11 LEFT neural foramina. 4. Stable thickening at the LEFT crus of the diaphragm consistent metastatic deposit. Electronically  Signed   By: SSuzy BouchardM.D.   On: 03/13/2015 17:05   Ct Abdomen Pelvis W Contrast  03/22/2015  CLINICAL DATA:  Followup metastatic right breast carcinoma. EXAM: CT ABDOMEN AND PELVIS WITH CONTRAST TECHNIQUE: Multidetector CT imaging of the abdomen and pelvis was performed using the standard protocol following bolus administration of intravenous contrast. CONTRAST:  1014mOMNIPAQUE IOHEXOL 300 MG/ML  SOLN COMPARISON:  PET-CT on 10/30/2014 and 03/19/2015 FINDINGS: Lower chest: Pleural-based soft tissue nodules in the inferior left hemithorax and small left pleural effusion show no significant change. Hepatobiliary: Several new hypovascular lesions are seen in the right hepatic lobe, largest measuring 1.5 cm on image 23/ series 2, consistent liver metastases. Pancreas: No mass, inflammatory changes, or other significant abnormality. Spleen: Within normal limits in size and appearance. Adrenals/Urinary Tract: No masses identified. No evidence of hydronephrosis. Stomach/Bowel: No evidence of obstruction, inflammatory process, or abnormal fluid collections. Vascular/Lymphatic: Mild increase in retroperitoneal lymphadenopathy seen in the left paraaortic region, currently measuring 1.8 x 2.8 cm on image 26 compared to 1.5 x 2.5 cm previously. Reproductive: No mass or other significant abnormality. Other: None. Musculoskeletal: No suspicious bone lesions visualized by CT, although hypermetabolic bone lesions seen on most recent PET involving the right L2 transverse process and left sacrum are  consistent with bone metastases. IMPRESSION: New small liver metastases compared to previous PET-CT on 10/30/2014. Minimal increase in left paraaortic retroperitoneal lymphadenopathy. Stable appearance of pleural-based metastatic disease in lower left hemithorax and small left pleural effusion. Small lumbosacral bone metastases demonstrated on most recent PET-CT of 03/19/2015 are not well visualized by CT. Electronically  Signed   By: Earle Gell M.D.   On: 03/22/2015 12:03   Nm Pet Image Restag (ps) Skull Base To Thigh  03/19/2015  CLINICAL DATA:  Subsequent treatment strategy for metastatic breast cancer. EXAM: NUCLEAR MEDICINE PET SKULL BASE TO THIGH TECHNIQUE: 10.1 mCi F-18 FDG was injected intravenously. Full-ring PET imaging was performed from the skull base to thigh after the radiotracer. CT data was obtained and used for attenuation correction and anatomic localization. FASTING BLOOD GLUCOSE:  Value: 79 mg/dl COMPARISON:  CT head/chest dated 03/13/2015. PET-CT dated 10/30/2014. FINDINGS: NECK No hypermetabolic lymph nodes in the neck. 2.0 cm lytic lesion in the left parietal bone (series 4/ image 12), although poorly evaluated on PET given blooming related to brain activity. CHEST 11 mm central right upper lobe nodule (series 8/ image 26), previously 10 mm, max SUV 2.9. Radiation changes in the medial left upper lobe/ paramediastinal region. Extensive pleural-based nodularity in the left hemithorax, max SUV 9.6 posteriorly in the left lower lobe. Associated small left pleural effusion with associated hypermetabolism. Heart is normal in size. No pericardial effusion. Atherosclerotic calcifications of the aortic arch. Postsurgical changes related to prior right axillary lymph node dissection. Thoracic lymphadenopathy, including: --11 mm short axis left axillary node (series 4/ image 85), max SUV 11.2, previously 11.1 --Left IMA node, max SUV 8.6, previously 2.2 --8 mm short axis AP window node (series 4/ image 87), max SUV 5.2, previously 2.3 --10 mm short axis left subcarinal node (series 4/image 93), max SUV 11.1, previously 2.9 ABDOMEN/PELVIS 2.4 cm hypoenhancing lesion laterally in the posterior right hepatic dome (series 4/ image 123), max SUV 4.8, new. Additional 2.1 cm lesion inferiorly in the posterior right hepatic lobe (series 4/ image 130), max SUV 9.6, previously 3.7. 1.5 cm short axis node medial to the left  kidney (series 4/ image 138), max SUV 9.7, previously 8.0. SKELETON Soft tissue metastasis in the lateral left pectoralis musculature, max SUV 3.5, previously 2.7. Osseous metastasis involving the right L2 transverse process, max SUV 5.6, new. Left sacral metastasis, max SUV 5.9, new. IMPRESSION: Findings have progressed from prior PET-CT. Radiation changes in the left upper lobe/paramediastinal region. Pleural-based nodularity in the left hemithorax. Associated left pleural effusion, likely malignant. 11 mm right upper lobe pulmonary metastasis, mildly progressed. Thoracic nodal metastases. Additional upper abdominal nodal metastasis medial to the left kidney. Two right hepatic metastases, mildly progressed. Soft tissue and osseous metastases, as described above, progressed/new. Electronically Signed   By: Julian Hy M.D.   On: 03/19/2015 16:55   US Biopsy  03/20/2015  CLINICAL DATA:  Progression of hypermetabolic hepatic metastatic disease. Breast carcinoma. EXAM: ULTRASOUND-GUIDED CORE LIVER BIOPSY TECHNIQUE: An ultrasound guided liver biopsy was thoroughly discussed with the patient and questions were answered. The benefits, risks, alternatives, and complications were also discussed. The patient understands and wishes to proceed with the procedure. A verbal as well as written consent was obtained. Survey ultrasound of the liver was performed, a representative right lesion localized, and an appropriate skin entry site was determined. Skin site was marked, prepped with Betadine, and draped in usual sterile fashion, and infiltrated locally with 1% lidocaine. Intravenous Fentanyl and Versed  were administered as conscious sedation during continuous cardiorespiratory monitoring by the radiology RN, with a total moderate sedation time of 12 minutes. A 17 gauge trocar needle was advanced under ultrasound guidance into the liver. 3 coaxial 18gauge core samples were then obtained through the guide needle. The  guide needle was removed. Post procedure scans demonstrate no apparent complication. COMPLICATIONS: COMPLICATIONS None immediate FINDINGS: Multiple right lobe liver lesions localized. Ultrasound-guided percutaneous core biopsy samples of the dominant lesion obtained. IMPRESSION: 1. Technically successful ultrasound guided core liver lesion biopsy. Electronically Signed   By: Lucrezia Europe M.D.   On: 03/20/2015 14:32    ASSESSMENT: 55 y.o. [place] woman with stage IV breast cancer, history as follows  (1)  S/p Right lumpectomy and sentinel lymph node sampling 03/15/2004 for a pT1c pN0. Stage IA invasive ductal carcinoma, grade 2, estrogen receptor 95% positive, progesterone receptor 65% positive, HER-2 not amplified; additional surgery 04/25/2004 for seroma or clearance showed no residual tumor  (2) adjuvant chemotherapy with cyclophosphamide and doxorubicin every 21 days x4 completed 07/19/2004  (3) adjuvant radiation given under Dr. Donella Stade in [place] completed July 2006  (4) the patient opted against adjuvant antiestrogen therapy  (5) genetics testing showed no BRCA mutations  (6) biopsy of a palpable right axillary mass 10/24/2009 showed invasive ductal carcinoma, grade 3, estrogen receptor 100% positive, progesterone receptor 2% positive (alert score 5) HER-2 negative; no evidence of systemic disease on PET scanning  (7) completed 3 of 4 planned cycles of docetaxel and cyclophosphamide September 2011, fourth cycle omitted because of marked elevations in liver function tests  (8) an right axillary lymph node dissection 03/06/2010 showed 3/8 lymph nodes removed to be involved by tumor, with extracapsular extension.  (9) 45 Gy radiation to the right axillary and right supraclavicular nodal areas, with capecitabine sensitization, completed March 2012   (10) intolerant of letrozole and exemestane; on tamoxifen with interruptions September 2012 to March 2013, but then continuing on tamoxifen  more continuously through March of 2015  (11) biopsy of mediastinal adenopathy 06/02/2013 shows invasive ductal carcinoma (gross cystic disease fluid protein positive, TTS-1 negative), estrogen receptor 80% positive, progesterone receptor 2% positive, HER-2 not amplified  (12) letrozole started March 2015-- tolerated with significant side effects, discontinued at the end of May 2015  (13) PET scan 08/16/2013 shows extensive left pleural metastatic disease and a large left pleural effusion that shifts cardiac and mediastinal structures to the right; adenopathy (celiac trunk, periadrenal, periaortic); and a left medial clavicular lesion; Status post left thoracentesis 08/16/2013 positive for adenocarcinoma, estrogen receptor positive, progesterone receptor negative.  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; everolimus was suggested as an available targeted agent  (18) exemestane started 03/31/2014, discontinued 10/31/2014 with evidence of progression  (a) everolimus added 04/03/2014 but not tolerated (cytopenias, elevated LFTs) even at minimal doses; stopped 04/17/2014  (19) fulvestrant started 12/20/2014  (20) palbociclib started     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 treated with Lovenox for 5-6 months  (c) right  upper extremity lymphedema--receiving physical therapy  (d) hepatic steatosis with  chronically elevated LFTs as well as unusual hepatic sensitivity to chemotherapy  (e) osteopenia with the lowest T score -1.6 on bone density scan 06/20/2013  (f) radiation oncology (Dr Valere Dross) has reviewed prior radiation records in case there is further mediastinal involvement with dysphagia etc in which case palliative XRT could be considered  (a) radiation to left mediastinum/ left 7th rib 3250 cGy in 13 sessions04/18/2016 through 08/02/2014  (b) radiation to T11 area: 22 Gy in 7 sessions, last dose 11/27/2014  (c) radiation left parietal scalp region to be completed 12/22/2014  (d) radiation to inguinal area  (g) chest wall pain--improved post radiation treatments  (h) zoster documented 04/04/2015   PLAN: We discussed adding steroids to the zoster treatment but she is very reluctant to take any more medication because her body is so sensitive and she is afraid of further irritating her liver. She wants to concentrate on increasing the Palbociclib dose. She is going to have lab work on January 11 and see me the next day. Hopefully we can increase the Palbociclib to twice weekly over the next week and then eventually 3 times a week which will get his close enough to every other day that it may be worthwhile continuing.  We discussed getting an MRI of the liver now but she wants to wait until she is on a stable dose of Palbociclib so that can provide [place] with a new baseline. I think this is reasonable.  I am hoping the radiation, which is already improving the "bone portion" of her pain, will further improve the pain in the groin area and help her walk with a little less difficulty. If there hasn't been a significant improvement within 2 weeks after the radiation is concluded, we will consider an MRI of the groin area..  Otherwise we are continuing thefulvestrant monthly. She is reluctant to start the Denosumab" until we have the Palbociclib question taking care of".  I have asked her  to "stopping" on Monday just to make sure the zoster is following the usual course. If there is evidence of dissemination (if she notices new lesions across the midline or outside the dermatome) she will call and likely will need admission for intravenous acyclovir.  Chauncey Cruel, MD   04/06/2015 11:37 AM

## 2015-04-09 ENCOUNTER — Ambulatory Visit: Payer: 59 | Admitting: Physical Therapy

## 2015-04-09 ENCOUNTER — Ambulatory Visit: Payer: 59 | Admitting: Radiation Oncology

## 2015-04-09 ENCOUNTER — Ambulatory Visit (HOSPITAL_BASED_OUTPATIENT_CLINIC_OR_DEPARTMENT_OTHER): Payer: 59

## 2015-04-09 ENCOUNTER — Telehealth: Payer: Self-pay | Admitting: Oncology

## 2015-04-09 ENCOUNTER — Encounter: Payer: Self-pay | Admitting: Radiation Oncology

## 2015-04-09 ENCOUNTER — Ambulatory Visit
Admission: RE | Admit: 2015-04-09 | Discharge: 2015-04-09 | Disposition: A | Discharge status: Home / Self Care | Payer: 59 | Source: Ambulatory Visit | Attending: Radiation Oncology | Admitting: Radiation Oncology

## 2015-04-09 ENCOUNTER — Other Ambulatory Visit: Payer: Self-pay | Admitting: *Deleted

## 2015-04-09 ENCOUNTER — Ambulatory Visit (HOSPITAL_BASED_OUTPATIENT_CLINIC_OR_DEPARTMENT_OTHER): Payer: 59 | Admitting: Oncology

## 2015-04-09 DIAGNOSIS — C801 Malignant (primary) neoplasm, unspecified: Secondary | ICD-10-CM

## 2015-04-09 DIAGNOSIS — Z510 Encounter for antineoplastic radiation therapy: Secondary | ICD-10-CM | POA: Diagnosis not present

## 2015-04-09 DIAGNOSIS — B029 Zoster without complications: Secondary | ICD-10-CM

## 2015-04-09 DIAGNOSIS — C7951 Secondary malignant neoplasm of bone: Secondary | ICD-10-CM

## 2015-04-09 DIAGNOSIS — Z79811 Long term (current) use of aromatase inhibitors: Secondary | ICD-10-CM

## 2015-04-09 DIAGNOSIS — C50211 Malignant neoplasm of upper-inner quadrant of right female breast: Secondary | ICD-10-CM | POA: Diagnosis not present

## 2015-04-09 DIAGNOSIS — Z86718 Personal history of other venous thrombosis and embolism: Secondary | ICD-10-CM

## 2015-04-09 DIAGNOSIS — J910 Malignant pleural effusion: Secondary | ICD-10-CM

## 2015-04-09 DIAGNOSIS — I890 Lymphedema, not elsewhere classified: Secondary | ICD-10-CM

## 2015-04-09 DIAGNOSIS — C771 Secondary and unspecified malignant neoplasm of intrathoracic lymph nodes: Secondary | ICD-10-CM

## 2015-04-09 DIAGNOSIS — R1030 Lower abdominal pain, unspecified: Secondary | ICD-10-CM

## 2015-04-09 DIAGNOSIS — R945 Abnormal results of liver function studies: Secondary | ICD-10-CM

## 2015-04-09 DIAGNOSIS — J90 Pleural effusion, not elsewhere classified: Secondary | ICD-10-CM

## 2015-04-09 DIAGNOSIS — R7989 Other specified abnormal findings of blood chemistry: Secondary | ICD-10-CM

## 2015-04-09 LAB — CBC WITH DIFFERENTIAL/PLATELET
BASO%: 0.4 % (ref 0.0–2.0)
Basophils Absolute: 0 10*3/uL (ref 0.0–0.1)
EOS%: 1.9 % (ref 0.0–7.0)
Eosinophils Absolute: 0.1 10*3/uL (ref 0.0–0.5)
HEMATOCRIT: 40 % (ref 34.8–46.6)
HGB: 13.5 g/dL (ref 11.6–15.9)
LYMPH#: 0.5 10*3/uL — AB (ref 0.9–3.3)
LYMPH%: 8.2 % — AB (ref 14.0–49.7)
MCH: 31.2 pg (ref 25.1–34.0)
MCHC: 33.9 g/dL (ref 31.5–36.0)
MCV: 92.1 fL (ref 79.5–101.0)
MONO#: 0.3 10*3/uL (ref 0.1–0.9)
MONO%: 4.5 % (ref 0.0–14.0)
NEUT#: 5.5 10*3/uL (ref 1.5–6.5)
NEUT%: 85 % — AB (ref 38.4–76.8)
Platelets: 346 10*3/uL (ref 145–400)
RBC: 4.34 10*6/uL (ref 3.70–5.45)
RDW: 13.6 % (ref 11.2–14.5)
WBC: 6.5 10*3/uL (ref 3.9–10.3)

## 2015-04-09 LAB — COMPREHENSIVE METABOLIC PANEL
ALT: 45 U/L (ref 0–55)
AST: 27 U/L (ref 5–34)
Albumin: 3.9 g/dL (ref 3.5–5.0)
Alkaline Phosphatase: 115 U/L (ref 40–150)
Anion Gap: 10 mEq/L (ref 3–11)
BUN: 11.2 mg/dL (ref 7.0–26.0)
CHLORIDE: 100 meq/L (ref 98–109)
CO2: 24 meq/L (ref 22–29)
CREATININE: 0.8 mg/dL (ref 0.6–1.1)
Calcium: 9.6 mg/dL (ref 8.4–10.4)
EGFR: 85 mL/min/{1.73_m2} — ABNORMAL LOW (ref >90)
GLUCOSE: 103 mg/dL (ref 70–140)
Potassium: 3.8 mEq/L (ref 3.5–5.1)
SODIUM: 133 meq/L — AB (ref 136–145)
Total Bilirubin: 0.4 mg/dL (ref 0.20–1.20)
Total Protein: 7.6 g/dL (ref 6.4–8.3)

## 2015-04-09 NOTE — Telephone Encounter (Signed)
Patient called in as dr Jana Hakim advised her to come in today to get rechecked for shingles and labs,bval has been made aware and will add orders if needed  [name] [name]

## 2015-04-09 NOTE — Progress Notes (Signed)
[facility]  [phone] [phone]     ID: [name] OB: March 04, 1961  MR#: [id number]  EZM#:[id number]  PCP: Pcp Not In System GYN:  [name] SU:  OTHER MD: [name], [name], Jae Dire, [name], [name], [name]  CHIEF COMPLAINT: Stage IV breast cancer  CURRENT TREATMENT: Fulvestrant, (denosumab,) (palbociclib)  BREAST CANCER HISTORY: From doctor [name]'s intake note 03/20/2004:  "The patient is a very pleasant 55 year old female, without significant past medical history.  Her family history is significant for a sister who at age 36 was diagnosed with invasive ductal carcinoma.  She is a breast cancer survivor at age 8 now.  The patient states that she has never really had a screening mammogram until October 2005, when she felt that it was time for her to start having mammograms done on a yearly basis.  Therefore, on 01/26/04, she underwent a screening mammogram and an abnormality was detected in the upper outer right breast.  She, therefore, underwent spot compression views of both the right and the left breast.  The left breast revealed a well-defined mass in the upper outer left quadrant, present at the 2 o'clock position, measuring 1.8 cm, 6 cm from the nipple.  This, by ultrasound, was felt to be a simple cyst measuring 1.8 cm.  On the right breast, a spiculated mass was noted in the upper outer right quadrant.  The ultrasound revealed a shadowing irregular solid mass at the 10:30 position, 9 cm from the nipple, measuring 1.2 cm in greatest dimension, correlating with the spiculated mass seen on the mammogram.  The right axilla was negative ultrasonically.  Because of this, the patient underwent a needle biopsy of the right breast and the biopsy was positive invasive mammary carcinoma that showed features consistent with a high-grade invasive ductal carcinoma associated with desmoplastic stroma.   No in situ component was seen and no definite lymphovascular invasion was identified.  On the core biopsy, the tumor measured about 0.8 cm.  Because of this, she was seen by Dr. [name] and the patient was taken to the [facility] on March 15, 2004.  She underwent a right breast lumpectomy with sentinel node biopsy.  The final pathology revealed an invasive ductal carcinoma, measuring 1.7 cm, grade 2 of 3.  Margins were free of tumor.  Atypical lobular hyperplasia was noted.  One sentinel node was removed which was negative for metastatic disease.  The tumor was staged at T1c, N0 MX.  It was estrogen receptor positive, progesterone receptor positive.  HER-2/neu was 2+.  FISH was negative.  All margins were free of tumor.  She is now seen in Medical Oncology for further evaluation and management of this newly diagnosed T1c, node negative, stage I, invasive ductal carcinoma of the right breast."  Her subsequent history is as detailed below  INTERVAL HISTORY: Cece returns today for follow-up of her stage IV breast cancer accompanied by her husband, [name]. She continues on fulvestrant, with good tolerance. She took one 75 mg dose ofPalbociclib on January 3. She had no symptoms regarding this that she is aware of except that zoster interrupted the next day. She came to see me and I started her on Valtrex thousand milligrams 3 times a day on that day. The area where the zoster initially started, in the right lower quadrant (T4 dermatome) has begun to crust over. However there are vesicles now going all the way to the umbilicus anteriorly and halfway  up the back, all along the dermatome. There is no evidence of spread across the midline.  She completes her radiation treatments today. She was not sure if she wanted to receive the last dose of radiation but I strongly encouraged her to do that.   REVIEW OF SYSTEMS: She thinks the perineal pain is a little bit better. She still walks awkwardly and is receiving  massage therapy which she says helps at least temporarily. She is very anxious about starting Denosumab and really does not want to do that this month. "Too much is going on". She is still having occasional problems with nausea, and irregular bowel movements. She is basically on no pain medication at present however. There have been no unusual headaches, visual changes, dizziness, or gait imbalance. She denies cough, pleurisy, or hemoptysis. A detailed review of systems today was otherwise stable  PAST MEDICAL HISTORY: Past Medical History  Diagnosis Date  . Seizures ([facility]) 2010    Isolated incident.  Marland Kitchen PONV (postoperative nausea and vomiting)   . Peripheral vascular disease ([place]) 02/2010    blood clot related to porta cath  . S/P radiation therapy 07/17/2014 through 08/02/2014     Left mediastinum, left seventh rib 3250 cGy in 13 sessions   . S/P radiation therapy 12/11/2014 through 12/22/2014     Left parietal calvarium 2400 cGy in 8 sessions   . Breast cancer ([place]) dx'd 2005/2011  . Bone metastases ([place]) dx'd 05/2014    PAST SURGICAL HISTORY: Past Surgical History  Procedure Laterality Date  . Breast lumpectomy  2005  . Axillary lymph node dissection  Dec. 2011  . Portacath placement  12/11  . Removal portacath    . Mediastinotomy chamberlain mcneil Left 06/02/2013    Procedure: MEDIASTINOTOMY CHAMBERLAIN MCNEIL;  Surgeon: [name], MD;  Location: [facility] OR;  Service: Thoracic;  Laterality: Left;  LEFT ANTERIOR MEDIASTINOTOMY     FAMILY HISTORY Family History  Problem Relation Age of Onset  . COPD Mother   . Breast cancer Sister 56   The patient's father is living, 82 years old as of may 2015. He lives in [place]. The patient's mother died from complications of COPD at the age of 36. These has 2 brothers, one sister. Her  sister developed breast cancer at the age of 49. She is doing well. The patient herself underwent genetic testing at [facility] in 2011 and was found to be BRCA negative  GYNECOLOGIC HISTORY:  Menarche age 74, she is GX P0. She stopped having periods with her initial chemotherapy in 2006.  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 Her husband [name] is a Medical illustrator in [place]. He has a child from a prior marriage. At home they have 2 rescue dogs, Hobo and Hinton. The patient is religious but not a church attender    ADVANCED DIRECTIVES: In place; at the 08/04/2014 visit in particular the patient was very clear, with her husband present, that she would not want any kind of feeding tubes or "other tubes" if her condition deteriorated.   HEALTH MAINTENANCE: Social History  Substance Use Topics  . Smoking status: Never Smoker   . Smokeless tobacco: Never Used  . Alcohol Use: No     Colonoscopy:  PAP:  Bone density: March 2015; mild osteopenia  Lipid panel:  Allergies  Allergen Reactions  . 2nd Skin Quick Heal Other (See Comments)    Other  Reaction: Skin peels  . Decadron [Dexamethasone] Other (See Comments)    Patient does not tolerate steroids.   . Dilaudid [Hydromorphone] Nausea And Vomiting  . Enoxaparin Other (See Comments)    unknown  . Fluconazole Swelling    Liver toxicity  . Hydromorphone Hcl Nausea And Vomiting  . Morphine And Related Nausea And Vomiting  . Protonix [Pantoprazole Sodium] Other (See Comments)    Patient reports it caused thrush.  . Tegaderm Ag Mesh [Silver]     Current Outpatient Prescriptions  Medication Sig Dispense Refill  . acetaminophen (TYLENOL) 160 mg/5 mL SOLN Reported on 03/27/2015    . ALPRAZolam (XANAX) 0.5 MG tablet Take 1 tablet (0.5 mg total) by mouth 2 (two) times daily as needed for anxiety. 30 tablet 0  . B Complex-C (B-COMPLEX WITH VITAMIN C) tablet Take 1  tablet by mouth daily. Reported on 03/27/2015    . cholecalciferol 2000 UNITS tablet Take 1 tablet (2,000 Units total) by mouth daily.    . folic acid (FOLVITE) 1 MG tablet Take 1 tablet (1 mg total) by mouth daily.    Marland Kitchen ibuprofen (ADVIL,MOTRIN) 200 MG tablet Take 400 mg by mouth every 4 (four) hours as needed for moderate pain. Reported on 03/27/2015    . magnesium chloride (SLOW-MAG) 64 MG TBEC SR tablet Take 60 mg by mouth daily.    . Melatonin 3 MG TABS Take 3 mg by mouth at bedtime.    . naproxen sodium (ANAPROX) 220 MG tablet Take 220 mg by mouth 2 (two) times daily with a meal.    . palbociclib (IBRANCE) 75 MG capsule Take 1 capsule (75 mg total) by mouth daily with breakfast. Take whole with food. 11 capsule 0  . saccharomyces boulardii (FLORASTOR) 250 MG capsule Take 250 mg by mouth daily.     . valACYclovir (VALTREX) 1000 MG tablet Take 1 tablet (1,000 mg total) by mouth 3 (three) times daily. 21 tablet 0   No current facility-administered medications for this visit.    OBJECTIVE: Middle-aged white woman who appears stated age 48 Vitals:     There is no weight on file to calculate BMI.   Filed Vitals:      Patient refused vitals today 04/09/2015     ECOG FS:1 - Symptomatic but completely ambulatory  Sclerae unicteric, pupils round and equal Oropharynx clear and moist-- no thrush or other lesions No cervical or supraclavicular adenopathy Lungs no rales or rhonchi, decreased breath sounds left base as previously noted Heart regular rate and rhythm Abd soft, obese,nontender, positive bowel sounds MSK no focal spinal tenderness Neuro: nonfocal, well oriented, anxious affect Breasts: deferred Skin: Vesicular rash along the T4 dermatome, possibly involving T5 as well, crusting over anteriorly,        LAB RESULTS:   CMP     Component Value Date/Time   NA 133* 04/09/2015 1115   NA 140 12/14/2014 0800   K 3.8 04/09/2015 1115   K 3.6 12/14/2014 0800   CL 103  12/14/2014 0800   CL 105 05/06/2012 1333   CO2 24 04/09/2015 1115   CO2 28 12/14/2014 0800   GLUCOSE 103 04/09/2015 1115   GLUCOSE 100* 12/14/2014 0800   GLUCOSE 124* 05/06/2012 1333   BUN 11.2 04/09/2015 1115   BUN 6 12/14/2014 0800   CREATININE 0.8 04/09/2015 1115   CREATININE 0.64 12/14/2014 0800   CALCIUM 9.6 04/09/2015 1115   CALCIUM 9.6 12/14/2014 0800   PROT 7.6 04/09/2015 1115   PROT 5.6*  11/20/2014 0433   ALBUMIN 3.9 04/09/2015 1115   ALBUMIN 2.6* 11/20/2014 0433   AST 27 04/09/2015 1115   AST 28 11/20/2014 0433   ALT 45 04/09/2015 1115   ALT 62* 11/20/2014 0433   ALKPHOS 115 04/09/2015 1115   ALKPHOS 105 11/20/2014 0433   BILITOT 0.40 04/09/2015 1115   BILITOT 0.7 11/20/2014 0433   GFRNONAA >60 12/14/2014 0800   GFRAA >60 12/14/2014 0800    No results found for: SPEP  Lab Results  Component Value Date   WBC 6.5 04/09/2015   NEUTROABS 5.5 04/09/2015   HGB 13.5 04/09/2015   HCT 40.0 04/09/2015   MCV 92.1 04/09/2015   PLT 346 04/09/2015      Chemistry      Component Value Date/Time   NA 133* 04/09/2015 1115   NA 140 12/14/2014 0800   K 3.8 04/09/2015 1115   K 3.6 12/14/2014 0800   CL 103 12/14/2014 0800   CL 105 05/06/2012 1333   CO2 24 04/09/2015 1115   CO2 28 12/14/2014 0800   BUN 11.2 04/09/2015 1115   BUN 6 12/14/2014 0800   CREATININE 0.8 04/09/2015 1115   CREATININE 0.64 12/14/2014 0800      Component Value Date/Time   CALCIUM 9.6 04/09/2015 1115   CALCIUM 9.6 12/14/2014 0800   ALKPHOS 115 04/09/2015 1115   ALKPHOS 105 11/20/2014 0433   AST 27 04/09/2015 1115   AST 28 11/20/2014 0433   ALT 45 04/09/2015 1115   ALT 62* 11/20/2014 0433   BILITOT 0.40 04/09/2015 1115   BILITOT 0.7 11/20/2014 0433       Lab Results  Component Value Date   LABCA2 25 11/02/2007    No components found for: [id number]  No results for input(s): INR in the last 168 hours.  Urinalysis    Component Value Date/Time   COLORURINE YELLOW 11/17/2014 0143     APPEARANCEUR CLOUDY* 11/17/2014 0143   LABSPEC 1.010 11/17/2014 0143   LABSPEC 1.005 09/12/2013 1542   PHURINE 6.5 11/17/2014 0143   PHURINE 6.0 09/12/2013 1542   GLUCOSEU NEGATIVE 11/17/2014 0143   GLUCOSEU Negative 09/12/2013 1542   HGBUR NEGATIVE 11/17/2014 0143   HGBUR Negative 09/12/2013 1542   BILIRUBINUR NEGATIVE 11/17/2014 0143   BILIRUBINUR Negative 09/12/2013 1542   KETONESUR NEGATIVE 11/17/2014 0143   KETONESUR Negative 09/12/2013 1542   PROTEINUR NEGATIVE 11/17/2014 0143   PROTEINUR Negative 09/12/2013 1542   UROBILINOGEN 0.2 11/17/2014 0143   UROBILINOGEN 0.2 09/12/2013 1542   NITRITE NEGATIVE 11/17/2014 0143   NITRITE Negative 09/12/2013 1542   LEUKOCYTESUR NEGATIVE 11/17/2014 0143   LEUKOCYTESUR Negative 09/12/2013 1542    STUDIES: Ct Head W Wo Contrast  03/13/2015  CLINICAL DATA:  Stage IV breast cancer EXAM: CT HEAD WITHOUT AND WITH CONTRAST TECHNIQUE: Contiguous axial images were obtained from the base of the skull through the vertex without and with intravenous contrast CONTRAST:  75 mL Omnipaque 300 IV E COMPARISON:  MRI 10/18/2014 FINDINGS: Ventricle size normal.  Cerebral volume normal. Negative for acute or chronic infarction. Negative for intracranial mass or edema.  Negative for hemorrhage. Postcontrast imaging of the brain reveals no enhancing lesion. Vascular enhancement normal. Left parietal skull lesion measures 20 mm and is unchanged from the MRI of 10/18/2014. This enhances on MRI and is compatible with metastatic disease. This was not present in 2012. Right frontal lesion over the convexity measures 11 mm was not present on the recent MRI. This also is most likely due to metastatic  disease. IMPRESSION: Negative for metastatic disease to the brain 20 mm left parietal bone lesion. This is unchanged. New 11 mm right frontal bone lesion consistent with metastatic disease. Electronically Signed   By: Franchot Gallo M.D.   On: 03/13/2015 15:14   Ct Chest W  Contrast  03/13/2015  CLINICAL DATA:  Stage IV breast cancer. Subsequent treatment evaluation EXAM: CT CHEST WITH CONTRAST TECHNIQUE: Multidetector CT imaging of the chest was performed during intravenous contrast administration. CONTRAST:  74m OMNIPAQUE IOHEXOL 300 MG/ML  SOLN COMPARISON:  CT 12/26/2014, PET-CT 10/30/2014 FINDINGS: Mediastinum/Nodes: Enlarged lymph node in the medial LEFT axilla measures 11 mm (image 14, series 4) compared to 11 mm. No supraclavicular adenopathy. Small paratracheal lymph nodes are unchanged measuring up to 8 mm. Subcarinal node measures 8 mm unchanged. No central pulmonary embolism. Lungs/Pleura: There is volume loss in the LEFT hemi thorax. There is consolidation with air bronchograms and bronchiectasis in the medial LEFT upper lobe not changed. Focus of round atelectasis at the LEFT lung base is unchanged. No new pulmonary nodularity. There is nodularity within the pleural space inferiorly in the LEFT hemi thorax similar to comparison exam (Image 38, series 4). There is metastatic lymph nodes in the precordial space adjacent to the LEFT ventricle. For example 12 mm node on image 37, series 4 compares to 12 mm. Upper abdomen: Progression of liver lesions. Lesion in the RIGHT hepatic lobe is increased in size with peripheral enhancement measuring 17 mm (image 47, series 4) compared to 9 mm on prior remeasured. More centrally in the RIGHT hepatic lobe 11 mm lesion on image 53 compares to 7 mm. Lesions central LEFT hepatic lobe on image 50 measures 15 mm not changed. There is thickening of the LEFT crus of the diaphragm to 2.5 cm not changed from prior. Musculoskeletal: Paraspinal lesion at T11 measures 16 mm (image 43, series 4 compared to 17 mm. Sclerotic lesion at T1 similar to comparison exam. Sclerotic lesion in the medial LEFT clavicle is unchanged. IMPRESSION: 1. Interval enlargement of hepatic metastasis. 2. Essentially stable pleural disease in the LEFT hemi thorax with  extension to the precordial space. 3. Stable paravertebral lesion adjacent to the T11 LEFT neural foramina. 4. Stable thickening at the LEFT crus of the diaphragm consistent metastatic deposit. Electronically Signed   By: SSuzy BouchardM.D.   On: 03/13/2015 17:05   Ct Abdomen Pelvis W Contrast  03/22/2015  CLINICAL DATA:  Followup metastatic right breast carcinoma. EXAM: CT ABDOMEN AND PELVIS WITH CONTRAST TECHNIQUE: Multidetector CT imaging of the abdomen and pelvis was performed using the standard protocol following bolus administration of intravenous contrast. CONTRAST:  1067mOMNIPAQUE IOHEXOL 300 MG/ML  SOLN COMPARISON:  PET-CT on 10/30/2014 and 03/19/2015 FINDINGS: Lower chest: Pleural-based soft tissue nodules in the inferior left hemithorax and small left pleural effusion show no significant change. Hepatobiliary: Several new hypovascular lesions are seen in the right hepatic lobe, largest measuring 1.5 cm on image 23/ series 2, consistent liver metastases. Pancreas: No mass, inflammatory changes, or other significant abnormality. Spleen: Within normal limits in size and appearance. Adrenals/Urinary Tract: No masses identified. No evidence of hydronephrosis. Stomach/Bowel: No evidence of obstruction, inflammatory process, or abnormal fluid collections. Vascular/Lymphatic: Mild increase in retroperitoneal lymphadenopathy seen in the left paraaortic region, currently measuring 1.8 x 2.8 cm on image 26 compared to 1.5 x 2.5 cm previously. Reproductive: No mass or other significant abnormality. Other: None. Musculoskeletal: No suspicious bone lesions visualized by CT, although hypermetabolic bone lesions seen  on most recent PET involving the right L2 transverse process and left sacrum are consistent with bone metastases. IMPRESSION: New small liver metastases compared to previous PET-CT on 10/30/2014. Minimal increase in left paraaortic retroperitoneal lymphadenopathy. Stable appearance of pleural-based  metastatic disease in lower left hemithorax and small left pleural effusion. Small lumbosacral bone metastases demonstrated on most recent PET-CT of 03/19/2015 are not well visualized by CT. Electronically Signed   By: Earle Gell M.D.   On: 03/22/2015 12:03   Nm Pet Image Restag (ps) Skull Base To Thigh  03/19/2015  CLINICAL DATA:  Subsequent treatment strategy for metastatic breast cancer. EXAM: NUCLEAR MEDICINE PET SKULL BASE TO THIGH TECHNIQUE: 10.1 mCi F-18 FDG was injected intravenously. Full-ring PET imaging was performed from the skull base to thigh after the radiotracer. CT data was obtained and used for attenuation correction and anatomic localization. FASTING BLOOD GLUCOSE:  Value: 79 mg/dl COMPARISON:  CT head/chest dated 03/13/2015. PET-CT dated 10/30/2014. FINDINGS: NECK No hypermetabolic lymph nodes in the neck. 2.0 cm lytic lesion in the left parietal bone (series 4/ image 12), although poorly evaluated on PET given blooming related to brain activity. CHEST 11 mm central right upper lobe nodule (series 8/ image 26), previously 10 mm, max SUV 2.9. Radiation changes in the medial left upper lobe/ paramediastinal region. Extensive pleural-based nodularity in the left hemithorax, max SUV 9.6 posteriorly in the left lower lobe. Associated small left pleural effusion with associated hypermetabolism. Heart is normal in size. No pericardial effusion. Atherosclerotic calcifications of the aortic arch. Postsurgical changes related to prior right axillary lymph node dissection. Thoracic lymphadenopathy, including: --11 mm short axis left axillary node (series 4/ image 85), max SUV 11.2, previously 11.1 --Left IMA node, max SUV 8.6, previously 2.2 --8 mm short axis AP window node (series 4/ image 87), max SUV 5.2, previously 2.3 --10 mm short axis left subcarinal node (series 4/image 93), max SUV 11.1, previously 2.9 ABDOMEN/PELVIS 2.4 cm hypoenhancing lesion laterally in the posterior right hepatic dome  (series 4/ image 123), max SUV 4.8, new. Additional 2.1 cm lesion inferiorly in the posterior right hepatic lobe (series 4/ image 130), max SUV 9.6, previously 3.7. 1.5 cm short axis node medial to the left kidney (series 4/ image 138), max SUV 9.7, previously 8.0. SKELETON Soft tissue metastasis in the lateral left pectoralis musculature, max SUV 3.5, previously 2.7. Osseous metastasis involving the right L2 transverse process, max SUV 5.6, new. Left sacral metastasis, max SUV 5.9, new. IMPRESSION: Findings have progressed from prior PET-CT. Radiation changes in the left upper lobe/paramediastinal region. Pleural-based nodularity in the left hemithorax. Associated left pleural effusion, likely malignant. 11 mm right upper lobe pulmonary metastasis, mildly progressed. Thoracic nodal metastases. Additional upper abdominal nodal metastasis medial to the left kidney. Two right hepatic metastases, mildly progressed. Soft tissue and osseous metastases, as described above, progressed/new. Electronically Signed   By: Julian Hy M.D.   On: 03/19/2015 16:55   US Biopsy  03/20/2015  CLINICAL DATA:  Progression of hypermetabolic hepatic metastatic disease. Breast carcinoma. EXAM: ULTRASOUND-GUIDED CORE LIVER BIOPSY TECHNIQUE: An ultrasound guided liver biopsy was thoroughly discussed with the patient and questions were answered. The benefits, risks, alternatives, and complications were also discussed. The patient understands and wishes to proceed with the procedure. A verbal as well as written consent was obtained. Survey ultrasound of the liver was performed, a representative right lesion localized, and an appropriate skin entry site was determined. Skin site was marked, prepped with Betadine, and draped  in usual sterile fashion, and infiltrated locally with 1% lidocaine. Intravenous Fentanyl and Versed were administered as conscious sedation during continuous cardiorespiratory monitoring by the radiology RN, with  a total moderate sedation time of 12 minutes. A 17 gauge trocar needle was advanced under ultrasound guidance into the liver. 3 coaxial 18gauge core samples were then obtained through the guide needle. The guide needle was removed. Post procedure scans demonstrate no apparent complication. COMPLICATIONS: COMPLICATIONS None immediate FINDINGS: Multiple right lobe liver lesions localized. Ultrasound-guided percutaneous core biopsy samples of the dominant lesion obtained. IMPRESSION: 1. Technically successful ultrasound guided core liver lesion biopsy. Electronically Signed   By: Lucrezia Europe M.D.   On: 03/20/2015 14:32    ASSESSMENT: 55 y.o. [place] woman with stage IV breast cancer, history as follows  (1)  S/p Right lumpectomy and sentinel lymph node sampling 03/15/2004 for a pT1c pN0. Stage IA invasive ductal carcinoma, grade 2, estrogen receptor 95% positive, progesterone receptor 65% positive, HER-2 not amplified; additional surgery 04/25/2004 for seroma or clearance showed no residual tumor  (2) adjuvant chemotherapy with cyclophosphamide and doxorubicin every 21 days x4 completed 07/19/2004  (3) adjuvant radiation given under Dr. Donella Stade in [place] completed July 2006  (4) the patient opted against adjuvant antiestrogen therapy  (5) genetics testing showed no BRCA mutations  (6) biopsy of a palpable right axillary mass 10/24/2009 showed invasive ductal carcinoma, grade 3, estrogen receptor 100% positive, progesterone receptor 2% positive (alert score 5) HER-2 negative; no evidence of systemic disease on PET scanning  (7) completed 3 of 4 planned cycles of docetaxel and cyclophosphamide September 2011, fourth cycle omitted because of marked elevations in liver function tests  (8) an right axillary lymph node dissection 03/06/2010 showed 3/8 lymph nodes removed to be involved by tumor, with extracapsular extension.  (9) 45 Gy radiation to the right axillary and right supraclavicular nodal  areas, with capecitabine sensitization, completed March 2012   (10) intolerant of letrozole and exemestane; on tamoxifen with interruptions September 2012 to March 2013, but then continuing on tamoxifen more continuously through March of 2015  (11) biopsy of mediastinal adenopathy 06/02/2013 shows invasive ductal carcinoma (gross cystic disease fluid protein positive, TTS-1 negative), estrogen receptor 80% positive, progesterone receptor 2% positive, HER-2 not amplified  (12) letrozole started March 2015-- tolerated with significant side effects, discontinued at the end of May 2015  (13) PET scan 08/16/2013 shows extensive left pleural metastatic disease and a large left pleural effusion that shifts cardiac and mediastinal structures to the right; adenopathy (celiac trunk, periadrenal, periaortic); and a left medial clavicular lesion; Status post left thoracentesis 08/16/2013 positive for adenocarcinoma, estrogen receptor positive, progesterone receptor negative.  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; everolimus was suggested as an available targeted agent  (18) exemestane started 03/31/2014, discontinued 10/31/2014 with evidence of progression  (a) everolimus added 04/03/2014 but not tolerated (cytopenias, elevated LFTs) even at minimal doses; stopped 04/17/2014  (19) fulvestrant started 12/20/2014  (a) palbociclib added at very low dose 04/03/2015 (starting dose 75 mg weekly)    ASSOCIATED CONCERNS:  (a) history of  isolated seizure April 2010,  with negative workup  (b) 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 treated with Lovenox for 5-6 months  (c) right upper extremity lymphedema--receiving physical therapy  (d) hepatic steatosis with chronically elevated LFTs as well as unusual hepatic sensitivity to chemotherapy  (e) osteopenia with the lowest T score -1.6 on bone density scan 06/20/2013  (f) radiation oncology (Dr Valere Dross) has reviewed prior radiation records in case there is further mediastinal involvement with dysphagia etc in which case palliative XRT could be considered  (a) radiation to left mediastinum/ left 7th rib 3250 cGy in 13 sessions04/18/2016 through 08/02/2014  (b) radiation to T11 area: 22 Gy in 7 sessions, last dose 11/27/2014  (c) radiation left parietal scalp region to be completed 12/22/2014  (d) radiation to sacral area completed 04/09/2015  (g) chest wall and perineal pain--improved post radiation treatments  (h) zoster diagnosed 04/04/2015-- on valacyclovir   PLAN: We spent approximately 45 minutes today going over several active problems with [name] and [name]. Rima's zoster continues to erupt along the dermatome. I have imaged that today. The anterior area which was the first to appear, has begun to crust over. I was not able to image that section because Haiku stopped functioning. She is not having significant pain due to this rash. Her immune system of course is not entirely normal. There is no evidence of dissemination so far.The plan is to continue the Valtrex at least until all the lesions have crusted over. I have asked her to let me know if there is spread outside the dermatome in which case we might have to consider admission for intravenous treatment  She completes her radiation treatments today. They have already begun to decrease the pain she has been experiencing in the perineal area. Hopefully this will continue to improve.  She  remains very concerned that different doctors read her scans differently and some do so more carefully than others, finding things in prior films that were not initially noted. Part of this is certainly due to the fact that different radiologists read with different levels of detail and likely even the same radiologist on different days may have different levels of attention. There is also the fact that once something does pop up one can frequently go back and find a very faint or very early sign of it, in the same area, which did not quite warrant comment at the time of the original scan. I think this is what is really happening. What I do not think is happening is that our radiologists are incompetent or that they are not making a good effort are reading her very complex films. Nevertheless this is something I will have to discuss with radiology at some point.  We went over her liver function tests which are now entirely normal. She is willing to take Palbociclib again on Wednesday. Her next dose will be Monday the 16th and the next dose after that Friday the 20th. She is going to see me the 20th. At that time we will consider going to every other day starting perhaps Tuesday the 24th. If we can get through an 11 dose cycle in a reasonable period of time, then that can be schedule and we would proceed to new baseline studies at that point.  We were going to start the Xgeva next week but she feels too much is going on. She also does not want to receive the first dose on the same day as the fulvestrant. Accordingly we will plan  to give her the first dose of Xgeva a week after her February fulvestrant's.  Binnie knows to call for any problems that may develop before her next visit.   Chauncey Cruel, MD   04/09/2015 11:59 AM

## 2015-04-09 NOTE — Telephone Encounter (Signed)
Appointments made and avs printed for patient which she declined,she also states that the xgeva will be after the next fulvest inj in feb as she is not getting it while dealing with shingles

## 2015-04-10 ENCOUNTER — Encounter: Payer: Self-pay | Admitting: Physical Therapy

## 2015-04-10 ENCOUNTER — Other Ambulatory Visit: Payer: Self-pay | Admitting: *Deleted

## 2015-04-10 ENCOUNTER — Ambulatory Visit: Payer: 59 | Admitting: Physical Therapy

## 2015-04-10 ENCOUNTER — Other Ambulatory Visit: Payer: Self-pay | Admitting: Oncology

## 2015-04-10 ENCOUNTER — Ambulatory Visit: Payer: 59 | Admitting: Radiation Oncology

## 2015-04-10 DIAGNOSIS — I890 Lymphedema, not elsewhere classified: Secondary | ICD-10-CM

## 2015-04-10 DIAGNOSIS — M25551 Pain in right hip: Secondary | ICD-10-CM

## 2015-04-10 DIAGNOSIS — M6289 Other specified disorders of muscle: Secondary | ICD-10-CM

## 2015-04-10 DIAGNOSIS — G729 Myopathy, unspecified: Secondary | ICD-10-CM | POA: Diagnosis not present

## 2015-04-10 DIAGNOSIS — R1031 Right lower quadrant pain: Secondary | ICD-10-CM

## 2015-04-10 MED ORDER — VALACYCLOVIR HCL 1 G PO TABS: 1000.0000 mg | ORAL_TABLET | Freq: Three times a day (TID) | ORAL | Status: DC

## 2015-04-10 NOTE — Therapy (Signed)
[facility] [address] [place], [place], [address] Phone: [phone]   Fax:  [phone]  Physical Therapy Treatment  Patient Details  Name: [name] [name] MRN: [id number] Date of Birth: [birth year] Referring Provider: Dr. Lurline Del  Encounter Date: 04/10/2015      PT End of Session - 04/10/15 1242    Visit Number 4   Date for PT Re-Evaluation 05/25/15   Authorization - Visit Number 60   Authorization - Number of Visits 4   PT Start Time [id number]   PT Stop Time 1313   PT Time Calculation (min) 38 min   Activity Tolerance Patient tolerated treatment well   Behavior During Therapy [facility] for tasks assessed/performed      Past Medical History  Diagnosis Date  . Seizures ([place]) 2010    Isolated incident.  Marland Kitchen PONV (postoperative nausea and vomiting)   . Peripheral vascular disease ([place]) 02/2010    blood clot related to porta cath  . S/P radiation therapy 07/17/2014 through 08/02/2014     Left mediastinum, left seventh rib 3250 cGy in 13 sessions   . S/P radiation therapy 12/11/2014 through 12/22/2014     Left parietal calvarium 2400 cGy in 8 sessions   . Breast cancer ([place]) dx'd 2005/2011  . Bone metastases ([place]) dx'd 05/2014    Past Surgical History  Procedure Laterality Date  . Breast lumpectomy  2005  . Axillary lymph node dissection  Dec. 2011  . Portacath placement  12/11  . Removal portacath    . Mediastinotomy chamberlain mcneil Left 06/02/2013    Procedure: MEDIASTINOTOMY CHAMBERLAIN MCNEIL;  Surgeon: [name], MD;  Location: [facility];  Service: Thoracic;  Laterality: Left;  LEFT ANTERIOR MEDIASTINOTOMY     There were no vitals filed for this visit.  Visit Diagnosis:  Pain in joint involving right pelvic region and thigh  Muscle  stiffness  Groin pain, right      Subjective Assessment - 04/10/15 1237    Subjective I have shingles and newe breakouts. Internal pain now due to radiation.  I had my last radiation treatment today.    Pertinent History patient vomited just prior to treatment today   How long can you walk comfortably? make patient limp   Patient Stated Goals walking with minimal pain and to get relief from lymphedema at axilla and anterior chest    Currently in Pain? Yes   Pain Score 6    Pain Location Groin   Pain Orientation Right   Pain Descriptors / Indicators Pressure   Pain Type Chronic pain   Pain Onset More than a month ago   Pain Frequency Constant   Aggravating Factors  walking   Pain Relieving Factors rest   Effect of Pain on Daily Activities limps with walking, walks slowly   Multiple Pain Sites No                         [facility] Adult PT Treatment/Exercise - 04/10/15 0001    Manual Therapy   Manual Therapy Soft tissue mobilization;Internal Pelvic Floor   Soft tissue mobilization right quad, iliotibial band, hip adductors, hamstring, around  right greater trochanter, right buttocks, right iliotibial band   Internal Pelvic Floor right obturator internist, right puborectalis, right iliococcygeus                PT Education - 04/10/15 1310    Education provided No  PT Short Term Goals - 02/27/15 1011    PT SHORT TERM GOAL #1   Title pain with walking decreased >/= 25%   Time 3   Period Weeks   Status Achieved   PT SHORT TERM GOAL #2   Title pelvis stays in correct alignment for 3 weeks   Time 3   Period Weeks   Status Achieved           PT Long Term Goals - 03/21/15 1312    PT LONG TERM GOAL #1   Title (p) indpendent with HEP   Time (p) 6   Period (p) Weeks   Status (p) On-going   PT LONG TERM GOAL #2   Title (p) pain with walking decreased >/= 80%   Period (p) Weeks   Status (p) --  50% better   PT LONG TERM GOAL #3   Title  (p) go up and down steps with a step over step pattern   Time (p) 6   Period (p) Weeks   Status (p) On-going  step to step due to pain           Long Term Clinic Goals - 04/06/15 1300    CC Long Term Goal  #1   Title Reports left upper back and left side pain is controlled at 4/10 or less with therapy at the current frequency.   Status On-going   CC Long Term Goal  #2   Title Pt. will report swelling is adequately managed to enable ADL function at a consistent level.   Status On-going            Plan - 04/10/15 1310    Clinical Impression Statement Patient limp has decreased by 75% after treatment and pain decreased to 3/10. Patient has less pain internally in the pelvic floor muscles due to the radiation helping.  Patient will contiune to benefit from physical therapy to reduce musclular pain and reduce limp with walking.    Pt will benefit from skilled therapeutic intervention in order to improve on the following deficits Pain;Increased muscle spasms   Clinical Impairments Affecting Rehab Potential active cancer   PT Frequency 2x / week   PT Duration 8 weeks   PT Treatment/Interventions Neuromuscular re-education;Patient/family education;Manual techniques   PT Next Visit Plan soft tissue work   PT Home Exercise Plan progress as needed   Consulted and Agree with Plan of Care Patient   PT Plan Continue manual lymph drainage, soft tissue work.        Problem List Patient Active Problem List   Diagnosis Date Noted  . 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 Zoster 04/04/2015  . Nausea with vomiting 11/18/2014  . Constipation 11/18/2014  . Left-sided thoracic back pain   . Bone metastases ([place]) 11/16/2014  . Back pain 11/15/2014  . Uncontrolled pain 11/14/2014  . 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 Chest wall pain 03/21/2014  . Abnormal LFTs (liver function tests) 09/12/2013  . Breast cancer of upper-inner quadrant of right female breast  ([place]) 08/18/2013  . 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 [place], [place], [address] Phone: [phone]   Fax:  [phone]  Name: [name] [name] MRN: [id number] Date of Birth: 03-06-1961

## 2015-04-10 NOTE — Therapy (Signed)
[place], [place], [address] Phone: [phone]   Fax:  [phone]  Physical Therapy Treatment  Patient Details  Name: [name] [name] MRN: [id number] Date of Birth: 1961/03/29 Referring Provider: Dr. Lurline Del  Encounter Date: 04/10/2015      PT End of Session - 04/10/15 1708    Visit Number 5   Number of Visits 24   Date for PT Re-Evaluation 06/25/15   PT Start Time [id number]   PT Stop Time 1658   PT Time Calculation (min) 45 min   Activity Tolerance Patient tolerated treatment well   Behavior During Therapy [facility] for tasks assessed/performed      Past Medical History  Diagnosis Date  . Seizures ([place]) 2010    Isolated incident.  Marland Kitchen PONV (postoperative nausea and vomiting)   . Peripheral vascular disease ([place]) 02/2010    blood clot related to porta cath  . S/P radiation therapy 07/17/2014 through 08/02/2014     Left mediastinum, left seventh rib 3250 cGy in 13 sessions   . S/P radiation therapy 12/11/2014 through 12/22/2014     Left parietal calvarium 2400 cGy in 8 sessions   . Breast cancer ([place]) dx'd 2005/2011  . Bone metastases ([place]) dx'd 05/2014    Past Surgical History  Procedure Laterality Date  . Breast lumpectomy  2005  . Axillary lymph node dissection  Dec. 2011  . Portacath placement  12/11  . Removal portacath    . Mediastinotomy chamberlain mcneil Left 06/02/2013    Procedure: MEDIASTINOTOMY CHAMBERLAIN MCNEIL;  Surgeon: [name], MD;  Location: [facility];  Service: Thoracic;  Laterality: Left;  LEFT ANTERIOR MEDIASTINOTOMY     There were no vitals filed for this visit.  Visit Diagnosis:  Muscle stiffness  Lymphedema  Pain in joint involving right pelvic region and thigh      Subjective Assessment -  04/10/15 1616    Subjective Had bloodwork done and so far everything was normal so far.     Currently in Pain? Yes   Pain Score 6    Pain Location Axilla   Pain Orientation Right   Aggravating Factors  increased swelling                         [facility] Adult PT Treatment/Exercise - 04/10/15 1707    Manual Therapy   Myofascial Release right axilla with focus at superior scar tightness (by DS)   Manual Lymphatic Drainage (MLD) In left sidelying, posterior interaxillary anastomosis and right axillo-inguinal anastomosis; in supine, short neck, left axilla and anterior interaxillary anastomosis, right groin and axillo-inguinal anastomosis, area between right breast scars, directing toward pathways.  In right sidelying, left periscapular area toward left groin. (by DS)   Other Manual Therapy soft tissue work to right thigh in right sidelying, for anteriomedial, medial, and posteriomedial aspects today (by DS)                PT Education - 04/10/15 1310    Education provided No          PT Short Term Goals - 02/27/15 1011    PT SHORT TERM GOAL #1   Title pain with walking decreased >/= 25%   Time 3   Period Weeks   Status Achieved   PT SHORT TERM GOAL #2   Title pelvis stays in correct alignment for 3 weeks   Time 3   Period Weeks   Status  Achieved           PT Long Term Goals - 03/21/15 1312    PT LONG TERM GOAL #1   Title (p) indpendent with HEP   Time (p) 6   Period (p) Weeks   Status (p) On-going   PT LONG TERM GOAL #2   Title (p) pain with walking decreased >/= 80%   Period (p) Weeks   Status (p) --  50% better   PT LONG TERM GOAL #3   Title (p) go up and down steps with a step over step pattern   Time (p) 6   Period (p) Weeks   Status (p) On-going  step to step due to pain           Long Term Clinic Goals - 04/06/15 1300    CC Long Term Goal  #1   Title Reports left upper back and left side pain is controlled at 4/10 or less with  therapy at the current frequency.   Status On-going   CC Long Term Goal  #2   Title Pt. will report swelling is adequately managed to enable ADL function at a consistent level.   Status On-going            Plan - 04/10/15 1709    Clinical Impression Statement Swelling at right axilla is not well-controlled again because patient is unable to use her Flexitouch due to recent onset of shingles at left flank and abdomen.  Discomfort in right axilla has crept up to a bit higher level as a result.     Pt will benefit from skilled therapeutic intervention in order to improve on the following deficits Pain;Increased muscle spasms;Increased edema   Rehab Potential Good   Clinical Impairments Affecting Rehab Potential active cancer   PT Frequency 2x / week   PT Duration 12 weeks   PT Treatment/Interventions Manual lymph drainage;Manual techniques   PT Next Visit Plan for lymphedema, manual lymph drainage and soft tissue work   Consulted and Agree with Plan of Care Patient        Problem List Patient Active Problem List   Diagnosis Date Noted  . 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 Zoster 04/04/2015  . Nausea with vomiting 11/18/2014  . Constipation 11/18/2014  . Left-sided thoracic back pain   . Bone metastases ([place]) 11/16/2014  . Back pain 11/15/2014  . Uncontrolled pain 11/14/2014  . 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 Chest wall pain 03/21/2014  . Abnormal LFTs (liver function tests) 09/12/2013  . Breast cancer of upper-inner quadrant of right female breast ([place]) 08/18/2013  . 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

## 2015-04-11 ENCOUNTER — Other Ambulatory Visit: Payer: 59

## 2015-04-11 ENCOUNTER — Ambulatory Visit (HOSPITAL_BASED_OUTPATIENT_CLINIC_OR_DEPARTMENT_OTHER): Payer: 59

## 2015-04-11 ENCOUNTER — Other Ambulatory Visit: Payer: Self-pay | Admitting: Oncology

## 2015-04-11 VITALS — BP 134/83 | HR 90 | Temp 97.7°F

## 2015-04-11 DIAGNOSIS — C50211 Malignant neoplasm of upper-inner quadrant of right female breast: Secondary | ICD-10-CM

## 2015-04-11 DIAGNOSIS — C771 Secondary and unspecified malignant neoplasm of intrathoracic lymph nodes: Secondary | ICD-10-CM | POA: Diagnosis not present

## 2015-04-11 MED ORDER — FULVESTRANT 250 MG/5ML IM SOLN
500.0000 mg | Freq: Once | INTRAMUSCULAR | Status: AC
  Administered 2015-04-11: 500 mg via INTRAMUSCULAR
  Filled 2015-04-11: qty 10

## 2015-04-11 MED ORDER — DENOSUMAB 120 MG/1.7ML ~~LOC~~ SOLN: 120.0000 mg | Freq: Once | SUBCUTANEOUS | Status: DC

## 2015-04-12 ENCOUNTER — Ambulatory Visit: Payer: 59 | Admitting: Physical Therapy

## 2015-04-12 ENCOUNTER — Ambulatory Visit: Payer: 59 | Admitting: Oncology

## 2015-04-12 ENCOUNTER — Encounter: Payer: Self-pay | Admitting: Physical Therapy

## 2015-04-12 DIAGNOSIS — G729 Myopathy, unspecified: Secondary | ICD-10-CM | POA: Diagnosis not present

## 2015-04-12 DIAGNOSIS — M6289 Other specified disorders of muscle: Secondary | ICD-10-CM

## 2015-04-12 DIAGNOSIS — R1031 Right lower quadrant pain: Secondary | ICD-10-CM

## 2015-04-12 DIAGNOSIS — M25551 Pain in right hip: Secondary | ICD-10-CM

## 2015-04-12 NOTE — Therapy (Signed)
[facility] [address] [place], [place], [address] Phone: [phone]   Fax:  [phone]  Physical Therapy Treatment  Patient Details  Name: [name] [name] MRN: [id number] Date of Birth: 02/21/1961 Referring Provider: Dr. Lurline Del  Encounter Date: 04/12/2015      PT End of Session - 04/12/15 1319    Visit Number 6   Number of Visits 24   Date for PT Re-Evaluation 05/25/15   Authorization - Visit Number 4   Authorization - Number of Visits 5   PT Start Time 1230   PT Stop Time 1310   PT Time Calculation (min) 40 min   Activity Tolerance Patient tolerated treatment well   Behavior During Therapy [facility] for tasks assessed/performed      Past Medical History  Diagnosis Date  . Seizures ([place]) 2010    Isolated incident.  Marland Kitchen PONV (postoperative nausea and vomiting)   . Peripheral vascular disease ([place]) 02/2010    blood clot related to porta cath  . S/P radiation therapy 07/17/2014 through 08/02/2014     Left mediastinum, left seventh rib 3250 cGy in 13 sessions   . S/P radiation therapy 12/11/2014 through 12/22/2014     Left parietal calvarium 2400 cGy in 8 sessions   . Breast cancer ([place]) dx'd 2005/2011  . Bone metastases ([place]) dx'd 05/2014    Past Surgical History  Procedure Laterality Date  . Breast lumpectomy  2005  . Axillary lymph node dissection  Dec. 2011  . Portacath placement  12/11  . Removal portacath    . Mediastinotomy chamberlain mcneil Left 06/02/2013    Procedure: MEDIASTINOTOMY CHAMBERLAIN MCNEIL;  Surgeon: [name], MD;  Location: [facility];  Service: Thoracic;  Laterality: Left;  LEFT ANTERIOR MEDIASTINOTOMY     There were no vitals filed for this visit.  Visit Diagnosis:  Muscle stiffness  Pain in joint involving  right pelvic region and thigh  Groin pain, right      Subjective Assessment - 04/12/15 1235    Subjective treatment helped till 4 in the afternoon.  i have had shot and medication and feel nauseated. Numb feeling when I urinate and odd feeling in vaginal area.  I feel like my vagina is closing up but ht e sloft tissue work helps. Patient reports pain is 15% better with decreased sharpness.   How long can you walk comfortably? make patient limp   Patient Stated Goals walking with minimal pain and to get relief from lymphedema at axilla and anterior chest    Currently in Pain? Yes   Pain Score 6    Pain Location Vagina   Pain Orientation Mid   Pain Descriptors / Indicators Constant   Pain Type Chronic pain   Pain Onset More than a month ago   Pain Frequency Constant   Aggravating Factors  urinating   Pain Relieving Factors rest   Multiple Pain Sites No                         [facility] Adult PT Treatment/Exercise - 04/12/15 0001    Manual Therapy   Manual Therapy Soft tissue mobilization;Internal Pelvic Floor   Soft tissue mobilization right hip adductor and hamstring   Internal Pelvic Floor right obturator internist, right puborectalis, right iliococcygeus and introitus on right                  PT Short Term Goals - 02/27/15 1011  PT SHORT TERM GOAL #1   Title pain with walking decreased >/= 25%   Time 3   Period Weeks   Status Achieved   PT SHORT TERM GOAL #2   Title pelvis stays in correct alignment for 3 weeks   Time 3   Period Weeks   Status Achieved           PT Long Term Goals - 04/12/15 1316    PT LONG TERM GOAL #1   Title indpendent with HEP   Time 6   Period Weeks   Status On-going   PT LONG TERM GOAL #2   Title pain with walking decreased >/= 80%   Time 6   Period Weeks   Status On-going   PT LONG TERM GOAL #3   Title go up and down steps with a step over step pattern   Time 6   Period Weeks   Status On-going            Long Term Clinic Goals - 04/06/15 1300    CC Long Term Goal  #1   Title Reports left upper back and left side pain is controlled at 4/10 or less with therapy at the current frequency.   Status On-going   CC Long Term Goal  #2   Title Pt. will report swelling is adequately managed to enable ADL function at a consistent level.   Status On-going            Plan - 04/12/15 1311    Clinical Impression Statement after treatment pain decreased to 3/10.  Patient is not feeling the tightness in the vaginal area and is 50% better.  Patient is limping 50% less after treatment. Patient has tightness in right introitus due to the effects of the radiation.  .    Pt will benefit from skilled therapeutic intervention in order to improve on the following deficits Pain;Increased muscle spasms;Increased edema   Rehab Potential Good   Clinical Impairments Affecting Rehab Potential active cancer   PT Frequency 2x / week   PT Duration 12 weeks   PT Treatment/Interventions Manual techniques;Therapeutic exercise;Neuromuscular re-education   PT Next Visit Plan soft tissue work to pelvic floor and right thigh   PT Home Exercise Plan progress as needed   Consulted and Agree with Plan of Care Patient   PT Plan Continue manual lymph drainage, soft tissue work.        Problem List Patient Active Problem List   Diagnosis Date Noted  . 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 Zoster 04/04/2015  . Nausea with vomiting 11/18/2014  . Constipation 11/18/2014  . Left-sided thoracic back pain   . Bone metastases ([place]) 11/16/2014  . Back pain 11/15/2014  . Uncontrolled pain 11/14/2014  . 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 Chest wall pain 03/21/2014  . Abnormal LFTs (liver function tests) 09/12/2013  . Breast cancer of upper-inner quadrant of right female breast ([place]) 08/18/2013  . 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 [place], [place], [address] Phone: [phone]   Fax:  [phone]  Name: JULIANN INACIO MRN: [id number] Date of Birth: 03/05/1961

## 2015-04-13 ENCOUNTER — Ambulatory Visit: Payer: 59 | Admitting: Physical Therapy

## 2015-04-13 DIAGNOSIS — M25551 Pain in right hip: Secondary | ICD-10-CM

## 2015-04-13 DIAGNOSIS — G729 Myopathy, unspecified: Secondary | ICD-10-CM | POA: Diagnosis not present

## 2015-04-13 DIAGNOSIS — M6289 Other specified disorders of muscle: Secondary | ICD-10-CM

## 2015-04-13 DIAGNOSIS — I890 Lymphedema, not elsewhere classified: Secondary | ICD-10-CM

## 2015-04-13 NOTE — Therapy (Signed)
[place], [place], [address] Phone: [phone]   Fax:  [phone]  Physical Therapy Treatment  Patient Details  Name: [name] [name] MRN: [id number] Date of Birth: 1960-10-15 Referring Provider: Dr. Lurline Del  Encounter Date: 04/13/2015      PT End of Session - 04/13/15 1206    Visit Number 7   Number of Visits 24   Date for PT Re-Evaluation 06/25/15   PT Start Time 1019   PT Stop Time 1103   PT Time Calculation (min) 44 min   Activity Tolerance Patient tolerated treatment well   Behavior During Therapy [facility] for tasks assessed/performed      Past Medical History  Diagnosis Date  . Seizures ([place]) 2010    Isolated incident.  Marland Kitchen PONV (postoperative nausea and vomiting)   . Peripheral vascular disease ([place]) 02/2010    blood clot related to porta cath  . S/P radiation therapy 07/17/2014 through 08/02/2014     Left mediastinum, left seventh rib 3250 cGy in 13 sessions   . S/P radiation therapy 12/11/2014 through 12/22/2014     Left parietal calvarium 2400 cGy in 8 sessions   . Breast cancer ([place]) dx'd 2005/2011  . Bone metastases ([place]) dx'd 05/2014    Past Surgical History  Procedure Laterality Date  . Breast lumpectomy  2005  . Axillary lymph node dissection  Dec. 2011  . Portacath placement  12/11  . Removal portacath    . Mediastinotomy chamberlain mcneil Left 06/02/2013    Procedure: MEDIASTINOTOMY CHAMBERLAIN MCNEIL;  Surgeon: [name], MD;  Location: [place];  Service: Thoracic;  Laterality: Left;  LEFT ANTERIOR MEDIASTINOTOMY     There were no vitals filed for this visit.  Visit Diagnosis:  Lymphedema  Muscle stiffness  Pain in joint involving right pelvic region and thigh      Subjective Assessment -  04/13/15 1021    Subjective Having pain at left sacrum.  [name] worked on the inside yesterday, but that's causing the leg to feel tight.   Currently in Pain? Yes   Pain Score 5   leg pain at 4   Pain Location Axilla   Pain Orientation Right                         [facility] Adult PT Treatment/Exercise - 04/13/15 0001    Manual Therapy   Myofascial Release right axilla with focus at superior scar tightness   Manual Lymphatic Drainage (MLD) In left sidelying, posterior interaxillary anastomosis and right axillo-inguinal anastomosis; in supine, short neck, left axilla and anterior interaxillary anastomosis, right groin and axillo-inguinal anastomosis, area between right breast scars, directing toward pathways.  In right sidelying, left periscapular area toward left groin. (by DS)   Other Manual Therapy soft tissue work to right thigh in right sidelying, for anteriomedial, medial, and posteriomedial aspects today (by DS)                  PT Short Term Goals - 02/27/15 1011    PT SHORT TERM GOAL #1   Title pain with walking decreased >/= 25%   Time 3   Period Weeks   Status Achieved   PT SHORT TERM GOAL #2   Title pelvis stays in correct alignment for 3 weeks   Time 3   Period Weeks   Status Achieved           PT Long Term Goals -  04/12/15 1316    PT LONG TERM GOAL #1   Title indpendent with HEP   Time 6   Period Weeks   Status On-going   PT LONG TERM GOAL #2   Title pain with walking decreased >/= 80%   Time 6   Period Weeks   Status On-going   PT LONG TERM GOAL #3   Title go up and down steps with a step over step pattern   Time 6   Period Weeks   Status On-going           Long Term Clinic Goals - 04/13/15 1208    CC Long Term Goal  #1   Title Reports left upper back and left side pain is controlled at 4/10 or less with therapy at the current frequency.   Baseline Pain has been increased in recent weeks until today, when it was 5/10.    Status On-going   CC Long Term Goal  #2   Title Pt. will report swelling is adequately managed to enable ADL function at a consistent level.   Status On-going            Plan - 04/13/15 1206    Clinical Impression Statement Patient reported a bit less pain at right axilla area, and swelling felt less full, less indurated today.     Pt will benefit from skilled therapeutic intervention in order to improve on the following deficits Pain;Increased muscle spasms;Increased edema   Rehab Potential Good   Clinical Impairments Affecting Rehab Potential active cancer   PT Frequency 2x / week   PT Duration 12 weeks   PT Treatment/Interventions Manual techniques;Manual lymph drainage   PT Next Visit Plan manual lymph drainage and soft tissue work   Consulted and Agree with Plan of Care Patient        Problem List Patient Active Problem List   Diagnosis Date Noted  . 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 Zoster 04/04/2015  . Nausea with vomiting 11/18/2014  . Constipation 11/18/2014  . Left-sided thoracic back pain   . Bone metastases ([place]) 11/16/2014  . Back pain 11/15/2014  . Uncontrolled pain 11/14/2014  . 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 Chest wall pain 03/21/2014  . Abnormal LFTs (liver function tests) 09/12/2013  . Breast cancer of upper-inner quadrant of right female breast ([place]) 08/18/2013  . 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

## 2015-04-16 ENCOUNTER — Other Ambulatory Visit: Payer: Self-pay | Admitting: Nurse Practitioner

## 2015-04-16 ENCOUNTER — Other Ambulatory Visit (HOSPITAL_BASED_OUTPATIENT_CLINIC_OR_DEPARTMENT_OTHER): Payer: 59

## 2015-04-16 ENCOUNTER — Other Ambulatory Visit: Payer: Self-pay | Admitting: Oncology

## 2015-04-16 ENCOUNTER — Ambulatory Visit: Payer: 59 | Admitting: Physical Therapy

## 2015-04-16 DIAGNOSIS — G729 Myopathy, unspecified: Secondary | ICD-10-CM | POA: Diagnosis not present

## 2015-04-16 DIAGNOSIS — M6289 Other specified disorders of muscle: Secondary | ICD-10-CM

## 2015-04-16 DIAGNOSIS — C771 Secondary and unspecified malignant neoplasm of intrathoracic lymph nodes: Secondary | ICD-10-CM | POA: Diagnosis not present

## 2015-04-16 DIAGNOSIS — M25551 Pain in right hip: Secondary | ICD-10-CM

## 2015-04-16 DIAGNOSIS — C50211 Malignant neoplasm of upper-inner quadrant of right female breast: Secondary | ICD-10-CM | POA: Diagnosis not present

## 2015-04-16 DIAGNOSIS — J90 Pleural effusion, not elsewhere classified: Secondary | ICD-10-CM

## 2015-04-16 DIAGNOSIS — I890 Lymphedema, not elsewhere classified: Secondary | ICD-10-CM

## 2015-04-16 DIAGNOSIS — C50929 Malignant neoplasm of unspecified site of unspecified male breast: Secondary | ICD-10-CM

## 2015-04-16 LAB — COMPREHENSIVE METABOLIC PANEL
ALBUMIN: 3.8 g/dL (ref 3.5–5.0)
ALK PHOS: 95 U/L (ref 40–150)
ALT: 42 U/L (ref 0–55)
ANION GAP: 9 meq/L (ref 3–11)
AST: 28 U/L (ref 5–34)
BUN: 11.2 mg/dL (ref 7.0–26.0)
CALCIUM: 9.6 mg/dL (ref 8.4–10.4)
CHLORIDE: 100 meq/L (ref 98–109)
CO2: 26 mEq/L (ref 22–29)
Creatinine: 0.7 mg/dL (ref 0.6–1.1)
Glucose: 74 mg/dl (ref 70–140)
POTASSIUM: 3.8 meq/L (ref 3.5–5.1)
Sodium: 135 mEq/L — ABNORMAL LOW (ref 136–145)
Total Bilirubin: <0.3 mg/dL (ref 0.20–1.20)
Total Protein: 7.3 g/dL (ref 6.4–8.3)

## 2015-04-16 LAB — CBC WITH DIFFERENTIAL/PLATELET
BASO%: 0.6 % (ref 0.0–2.0)
Basophils Absolute: 0 10*3/uL (ref 0.0–0.1)
EOS ABS: 0.1 10*3/uL (ref 0.0–0.5)
EOS%: 2.1 % (ref 0.0–7.0)
HCT: 37.7 % (ref 34.8–46.6)
HEMOGLOBIN: 12.8 g/dL (ref 11.6–15.9)
LYMPH%: 10.8 % — AB (ref 14.0–49.7)
MCH: 31.3 pg (ref 25.1–34.0)
MCHC: 33.9 g/dL (ref 31.5–36.0)
MCV: 92.4 fL (ref 79.5–101.0)
MONO#: 0.4 10*3/uL (ref 0.1–0.9)
MONO%: 8.2 % (ref 0.0–14.0)
NEUT%: 78.3 % — ABNORMAL HIGH (ref 38.4–76.8)
NEUTROS ABS: 4.3 10*3/uL (ref 1.5–6.5)
PLATELETS: 358 10*3/uL (ref 145–400)
RBC: 4.08 10*6/uL (ref 3.70–5.45)
RDW: 13.2 % (ref 11.2–14.5)
WBC: 5.5 10*3/uL (ref 3.9–10.3)
lymph#: 0.6 10*3/uL — ABNORMAL LOW (ref 0.9–3.3)

## 2015-04-16 NOTE — Therapy (Signed)
[place], [place], [address] Phone: [phone]   Fax:  [phone]  Physical Therapy Treatment  Patient Details  Name: [name] [name] MRN: [id number] Date of Birth: 1960-12-17 Referring Provider: Dr. Lurline Del  Encounter Date: 04/16/2015      PT End of Session - 04/16/15 1254    Visit Number 8   Number of Visits 24   Date for PT Re-Evaluation 06/25/15   PT Start Time 1016   PT Stop Time 1100   PT Time Calculation (min) 44 min      Past Medical History  Diagnosis Date  . Seizures ([place]) 2010    Isolated incident.  Marland Kitchen PONV (postoperative nausea and vomiting)   . Peripheral vascular disease ([place]) 02/2010    blood clot related to porta cath  . S/P radiation therapy 07/17/2014 through 08/02/2014     Left mediastinum, left seventh rib 3250 cGy in 13 sessions   . S/P radiation therapy 12/11/2014 through 12/22/2014     Left parietal calvarium 2400 cGy in 8 sessions   . Breast cancer ([place]) dx'd 2005/2011  . Bone metastases ([place]) dx'd 05/2014    Past Surgical History  Procedure Laterality Date  . Breast lumpectomy  2005  . Axillary lymph node dissection  Dec. 2011  . Portacath placement  12/11  . Removal portacath    . Mediastinotomy chamberlain mcneil Left 06/02/2013    Procedure: MEDIASTINOTOMY CHAMBERLAIN MCNEIL;  Surgeon: [name], MD;  Location: [place];  Service: Thoracic;  Laterality: Left;  LEFT ANTERIOR MEDIASTINOTOMY     There were no vitals filed for this visit.  Visit Diagnosis:  Lymphedema  Muscle stiffness  Pain in joint involving right pelvic region and thigh      Subjective Assessment - 04/16/15 1016    Subjective Took the Ibrance this morning and it's not, but it feels like it's stuck in my throat.      Currently in Pain? Yes   Pain Score 6    Pain Location Axilla   Pain Orientation Right   Pain Score 5   Pain Location Leg   Pain Orientation Right   Aggravating Factors  walking more               LYMPHEDEMA/ONCOLOGY QUESTIONNAIRE - 04/16/15 1021    Lymphedema Assessments   Lymphedema Assessments Upper extremities   Right Upper Extremity Lymphedema   Other around chest at level between two scars at right lateral breast, 95 cm. (on exhalation)                  [facility] Adult PT Treatment/Exercise - 04/16/15 0001    Manual Therapy   Manual Therapy Edema management   Edema Management chest circumference measurement taken   Myofascial Release right axilla with focus at superior scar tightness   Manual Lymphatic Drainage (MLD) In left sidelying, posterior interaxillary anastomosis and right axillo-inguinal anastomosis; in supine, short neck, left axilla and anterior interaxillary anastomosis, right groin and axillo-inguinal anastomosis, area between right breast scars, directing toward pathways.  In right sidelying, left periscapular area toward left groin.   Other Manual Therapy soft tissue work to right thigh in right sidelying, for anteriomedial, medial, and posteriomedial aspects today                  PT Short Term Goals - 02/27/15 1011    PT SHORT TERM GOAL #1   Title pain with walking decreased >/= 25%  Time 3   Period Weeks   Status Achieved   PT SHORT TERM GOAL #2   Title pelvis stays in correct alignment for 3 weeks   Time 3   Period Weeks   Status Achieved           PT Long Term Goals - 04/12/15 1316    PT LONG TERM GOAL #1   Title indpendent with HEP   Time 6   Period Weeks   Status On-going   PT LONG TERM GOAL #2   Title pain with walking decreased >/= 80%   Time 6   Period Weeks   Status On-going   PT LONG TERM GOAL #3   Title go up and down steps with a step over step pattern   Time 6   Period Weeks   Status On-going            Long Term Clinic Goals - 04/13/15 1208    CC Long Term Goal  #1   Title Reports left upper back and left side pain is controlled at 4/10 or less with therapy at the current frequency.   Baseline Pain has been increased in recent weeks until today, when it was 5/10.   Status On-going   CC Long Term Goal  #2   Title Pt. will report swelling is adequately managed to enable ADL function at a consistent level.   Status On-going            Plan - 04/16/15 1255    Clinical Impression Statement Continuing to benefit from therapy for pain relief in leg and decreased swelling discomfort.   Pt will benefit from skilled therapeutic intervention in order to improve on the following deficits Pain;Increased muscle spasms;Increased edema   Rehab Potential Good   Clinical Impairments Affecting Rehab Potential active cancer   PT Frequency 2x / week   PT Duration 12 weeks   PT Treatment/Interventions Manual techniques;Manual lymph drainage   PT Next Visit Plan manual lymph drainage and soft tissue work   Consulted and Agree with Plan of Care Patient        Problem List Patient Active Problem List   Diagnosis Date Noted  . 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 Zoster 04/04/2015  . Nausea with vomiting 11/18/2014  . Constipation 11/18/2014  . Left-sided thoracic back pain   . Bone metastases ([place]) 11/16/2014  . Back pain 11/15/2014  . Uncontrolled pain 11/14/2014  . 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 Chest wall pain 03/21/2014  . Abnormal LFTs (liver function tests) 09/12/2013  . Breast cancer of upper-inner quadrant of right female breast ([place]) 08/18/2013  . 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

## 2015-04-16 NOTE — Progress Notes (Unsigned)
I met with Ebonye, her husband [name], our medical Dir. Dr. Rolanda Jay, and Pattricia Boss from radiology today. Zaynab was very concerned that there had been some inconsistent radiology readings. She wanted to make sure her scans were read consistently and also floridly. This was discussed extensively. We agreed that Dr. Clyda Greener we'll read all her scans (not necessarily chest x-rays are similar films obtained in the emergency room if she goes there). He will identify the key markers to follow in terms of progression or response to treatment and also alert as to any significant new findings.   To facilitate this when eyes  Order a scan on [place] I will simultaneously let Dr. Clyda Greener know. Once it is scheduled and she shows up she will let him know that it is being done that day so that this can Be directed to him to be read. If we get a brain MRI we will involved Dr. Cyndie Chime.  If Dr. Susanne Greenhouse but is out of town he will over read the scan read by another one of his partners when he returns.   I think this is going to help provide this patient with a very complex disease the best chance at staying right on top of her  Symptoms. [place] is also encouraged this will work well for her. I will see her again Friday.

## 2015-04-17 ENCOUNTER — Ambulatory Visit: Payer: 59 | Admitting: Physical Therapy

## 2015-04-17 ENCOUNTER — Encounter: Payer: Self-pay | Admitting: Physical Therapy

## 2015-04-17 DIAGNOSIS — M25551 Pain in right hip: Secondary | ICD-10-CM

## 2015-04-17 DIAGNOSIS — R1031 Right lower quadrant pain: Secondary | ICD-10-CM

## 2015-04-17 DIAGNOSIS — G729 Myopathy, unspecified: Secondary | ICD-10-CM | POA: Diagnosis not present

## 2015-04-17 NOTE — Therapy (Signed)
[facility] [address] [place], [place], [address] Phone: [phone]   Fax:  [phone]  Physical Therapy Treatment  Patient Details  Name: [name] [name] MRN: [id number] Date of Birth: 1960-04-10 Referring Provider: Dr. Lurline Del  Encounter Date: 04/17/2015      PT End of Session - 04/17/15 1151    Visit Number 9  pelvis   Date for PT Re-Evaluation 05/25/15  pelvis   Authorization - Visit Number 90   Authorization - Number of Visits 9   PT Start Time [id number]   PT Stop Time [id number]   PT Time Calculation (min) 50 min   Activity Tolerance Patient tolerated treatment well   Behavior During Therapy [facility] for tasks assessed/performed      Past Medical History  Diagnosis Date  . Seizures ([place]) 2010    Isolated incident.  Marland Kitchen PONV (postoperative nausea and vomiting)   . Peripheral vascular disease ([place]) 02/2010    blood clot related to porta cath  . S/P radiation therapy 07/17/2014 through 08/02/2014     Left mediastinum, left seventh rib 3250 cGy in 13 sessions   . S/P radiation therapy 12/11/2014 through 12/22/2014     Left parietal calvarium 2400 cGy in 8 sessions   . Breast cancer ([place]) dx'd 2005/2011  . Bone metastases ([place]) dx'd 05/2014    Past Surgical History  Procedure Laterality Date  . Breast lumpectomy  2005  . Axillary lymph node dissection  Dec. 2011  . Portacath placement  12/11  . Removal portacath    . Mediastinotomy chamberlain mcneil Left 06/02/2013    Procedure: MEDIASTINOTOMY CHAMBERLAIN MCNEIL;  Surgeon: [name], MD;  Location: [place];  Service: Thoracic;  Laterality: Left;  LEFT ANTERIOR MEDIASTINOTOMY     There were no vitals filed for this visit.  Visit Diagnosis:  Groin pain, right  Pain in joint involving right  pelvic region and thigh      Subjective Assessment - 04/17/15 1149    Subjective I feel like I am swollen internally.  My back is bothering me and burning from the shingles.    Pertinent History --   How long can you walk comfortably? make patient limp   Patient Stated Goals walking with minimal pain and to get relief from lymphedema at axilla and anterior chest    Currently in Pain? Yes   Pain Score 6    Pain Location Vagina   Pain Orientation Mid;Right   Pain Descriptors / Indicators Constant   Pain Type Chronic pain   Pain Radiating Towards down right knee and posterior   Pain Onset More than a month ago   Aggravating Factors  urinating, walking   Pain Relieving Factors rest   Effect of Pain on Daily Activities walking with a limp               LYMPHEDEMA/ONCOLOGY QUESTIONNAIRE - 04/16/15 1021    Lymphedema Assessments   Lymphedema Assessments Upper extremities   Right Upper Extremity Lymphedema   Other around chest at level between two scars at right lateral breast, 95 cm. (on exhalation)               Pelvic Floor Special Questions - 04/17/15 0001    Pelvic Floor Internal Exam Patient approves physical therapist to perform pelvic floor muscle assessment   Exam Type Vaginal           [facility] Adult PT Treatment/Exercise - 04/17/15 0001  Manual Therapy   Manual Therapy Soft tissue mobilization   Soft tissue mobilization right hip adductor and hamstring, right gluteal and posteior ischial tuberosity   Internal Pelvic Floor right obturator internist, right puborectalis, right iliococcygeus and introitus on right                  PT Short Term Goals - 02/27/15 1011    PT SHORT TERM GOAL #1   Title pain with walking decreased >/= 25%   Time 3   Period Weeks   Status Achieved   PT SHORT TERM GOAL #2   Title pelvis stays in correct alignment for 3 weeks   Time 3   Period Weeks   Status Achieved           PT Long Term Goals - 04/17/15  1234    PT LONG TERM GOAL #1   Title indpendent with HEP   Time 6   Period Weeks   Status On-going  still learning exercises   PT LONG TERM GOAL #2   Title pain with walking decreased >/= 80%   Time 6   Period Weeks   Status On-going  6/10 pain with walking   PT LONG TERM GOAL #3   Title go up and down steps with a step over step pattern   Time 6   Period Weeks   Status On-going  not able to            [place] [facility] Goals - 04/13/15 1208    CC Long Term Goal  #1   Title Reports left upper back and left side pain is controlled at 4/10 or less with therapy at the current frequency.   Baseline Pain has been increased in recent weeks until today, when it was 5/10.   Status On-going   CC Long Term Goal  #2   Title Pt. will report swelling is adequately managed to enable ADL function at a consistent level.   Status On-going            Plan - 04/17/15 1228    Clinical Impression Statement Patient reports the right thight and vaginal area pain is 40% decrease after soft tissue work.  The limp on the right is 50% better. Patient was informed that using a cane while walking will decrease strain on right leg and back.  Patient has taut band on posterior left vaginal area that is very sensitive.  Patient has less sensitivity in posterior vaginal canal. Patient will benefit form physical therapy to reduce pain.    Pt will benefit from skilled therapeutic intervention in order to improve on the following deficits Difficulty walking;Pain;Increased muscle spasms;Increased fascial restricitons   Rehab Potential Good   Clinical Impairments Affecting Rehab Potential active cancer   PT Frequency 2x / week   PT Duration 8 weeks   PT Treatment/Interventions Manual techniques;Therapeutic activities;Therapeutic exercise   PT Next Visit Plan soft tissue work, hip isometrics   PT Home Exercise Plan progress as needed   Consulted and Agree with Plan of Care Patient   PT Plan Continue  manual lymph drainage, soft tissue work.        Problem List Patient Active Problem List   Diagnosis Date Noted  . 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 Zoster 04/04/2015  . Nausea with vomiting 11/18/2014  . Constipation 11/18/2014  . Left-sided thoracic back pain   . Bone metastases ([place]) 11/16/2014  . Back pain 11/15/2014  . Uncontrolled pain 11/14/2014  .  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 Chest wall pain 03/21/2014  . Abnormal LFTs (liver function tests) 09/12/2013  . Breast cancer of upper-inner quadrant of right female breast ([place]) 08/18/2013  . 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 [place], [place], [address] Phone: [phone]   Fax:  [phone]  Name: [name] [name] MRN: [id number] Date of Birth: 12-03-1960

## 2015-04-18 ENCOUNTER — Ambulatory Visit: Payer: 59

## 2015-04-19 ENCOUNTER — Ambulatory Visit: Payer: 59 | Admitting: Physical Therapy

## 2015-04-19 ENCOUNTER — Encounter: Payer: Self-pay | Admitting: Physical Therapy

## 2015-04-19 DIAGNOSIS — M25551 Pain in right hip: Secondary | ICD-10-CM

## 2015-04-19 DIAGNOSIS — G729 Myopathy, unspecified: Secondary | ICD-10-CM | POA: Diagnosis not present

## 2015-04-19 DIAGNOSIS — R1031 Right lower quadrant pain: Secondary | ICD-10-CM

## 2015-04-19 DIAGNOSIS — M6289 Other specified disorders of muscle: Secondary | ICD-10-CM

## 2015-04-19 NOTE — Therapy (Signed)
[facility] [address] [place], [place], [address] Phone: [phone]   Fax:  [phone]  Physical Therapy Treatment  Patient Details  Name: [name] [name] MRN: [id number] Date of Birth: May 13, 1960 Referring Provider: Dr. Lurline Del  Encounter Date: 04/19/2015      PT End of Session - 04/19/15 1234    Visit Number 10  pelvis   Date for PT Re-Evaluation 05/25/15  pelvis   Authorization - Visit Number 94   Authorization - Number of Visits 10   PT Start Time 1230   PT Stop Time 1310   PT Time Calculation (min) 40 min   Activity Tolerance Patient tolerated treatment well   Behavior During Therapy [facility] for tasks assessed/performed      Past Medical History  Diagnosis Date  . Seizures ([place]) 2010    Isolated incident.  Marland Kitchen PONV (postoperative nausea and vomiting)   . Peripheral vascular disease ([place]) 02/2010    blood clot related to porta cath  . S/P radiation therapy 07/17/2014 through 08/02/2014     Left mediastinum, left seventh rib 3250 cGy in 13 sessions   . S/P radiation therapy 12/11/2014 through 12/22/2014     Left parietal calvarium 2400 cGy in 8 sessions   . Breast cancer ([place]) dx'd 2005/2011  . Bone metastases ([place]) dx'd 05/2014    Past Surgical History  Procedure Laterality Date  . Breast lumpectomy  2005  . Axillary lymph node dissection  Dec. 2011  . Portacath placement  12/11  . Removal portacath    . Mediastinotomy chamberlain mcneil Left 06/02/2013    Procedure: MEDIASTINOTOMY CHAMBERLAIN MCNEIL;  Surgeon: [name], MD;  Location: [place];  Service: Thoracic;  Laterality: Left;  LEFT ANTERIOR MEDIASTINOTOMY     There were no vitals filed for this visit.  Visit Diagnosis:  Groin pain, right  Pain in joint involving  right pelvic region and thigh  Muscle stiffness      Subjective Assessment - 04/19/15 1235    Subjective I feel better.  I have increased pain with standing on right leg.    How long can you walk comfortably? make patient limp   Patient Stated Goals walking with minimal pain and to get relief from lymphedema at axilla and anterior chest    Currently in Pain? Yes   Pain Score 6    Pain Location Vagina   Pain Orientation Right;Mid   Pain Descriptors / Indicators Constant;Tightness   Pain Type Chronic pain   Pain Radiating Towards down right leg   Pain Onset More than a month ago   Pain Frequency Constant   Aggravating Factors  walking, single leg stance on right   Pain Relieving Factors rest   Effect of Pain on Daily Activities walking with limp   Multiple Pain Sites No   Multiple Pain Sites No   Pain Score 7   Pain Location Back   Pain Orientation Lower   Pain Descriptors / Indicators Burning;Constant   Pain Type Chronic pain   Pain Onset 1 to 4 weeks ago   Pain Frequency Constant   Aggravating Factors  nothing makes worse or better   Pain Relieving Factors nothing makes it better                      Pelvic Floor Special Questions - 04/19/15 0001    Pelvic Floor Internal Exam Patient approves physical therapist to perform pelvic floor muscle assessment  Exam Type Vaginal           [facility] Adult PT Treatment/Exercise - 04/19/15 0001    Manual Therapy   Soft tissue mobilization right hip adductor and hamstring, right gluteal and posteior ischial tuberosity  passively stretched right hamstring and hip adductor   Internal Pelvic Floor right obturator internist, right puborectalis, right iliococcygeus and introitus on right                PT Education - 04/19/15 1307    Education provided No          PT Short Term Goals - 02/27/15 1011    PT SHORT TERM GOAL #1   Title pain with walking decreased >/= 25%   Time 3   Period Weeks   Status  Achieved   PT SHORT TERM GOAL #2   Title pelvis stays in correct alignment for 3 weeks   Time 3   Period Weeks   Status Achieved           PT Long Term Goals - 04/17/15 1234    PT LONG TERM GOAL #1   Title indpendent with HEP   Time 6   Period Weeks   Status On-going  still learning exercises   PT LONG TERM GOAL #2   Title pain with walking decreased >/= 80%   Time 6   Period Weeks   Status On-going  6/10 pain with walking   PT LONG TERM GOAL #3   Title go up and down steps with a step over step pattern   Time 6   Period Weeks   Status On-going  not able to            [facility] Goals - 04/13/15 1208    CC Long Term Goal  #1   Title Reports left upper back and left side pain is controlled at 4/10 or less with therapy at the current frequency.   Baseline Pain has been increased in recent weeks until today, when it was 5/10.   Status On-going   CC Long Term Goal  #2   Title Pt. will report swelling is adequately managed to enable ADL function at a consistent level.   Status On-going            Plan - 04/19/15 1307    Clinical Impression Statement Patient reports the burning and pulling with urination is 90% better and inflammation in vaginal area feels 75% better.  Patient able to ambulate with 80% decresaed in limp after treatment.  Patient has less thickness located on left perneal area. Patient will benefit from physical therapy to reduce pain and improve gait.    Pt will benefit from skilled therapeutic intervention in order to improve on the following deficits Difficulty walking;Pain;Increased muscle spasms;Increased fascial restricitons   Rehab Potential Good   Clinical Impairments Affecting Rehab Potential active cancer   PT Frequency 2x / week   PT Duration 8 weeks   PT Treatment/Interventions Manual techniques;Therapeutic activities;Therapeutic exercise   PT Next Visit Plan soft tissue work, hip isometrics   PT Home Exercise Plan progress as  needed   Consulted and Agree with Plan of Care Patient   PT Plan Continue manual lymph drainage, soft tissue work.        Problem List Patient Active Problem List   Diagnosis Date Noted  . 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 Zoster 04/04/2015  . Nausea with vomiting 11/18/2014  . Constipation 11/18/2014  .  Left-sided thoracic back pain   . Bone metastases ([place]) 11/16/2014  . Back pain 11/15/2014  . Uncontrolled pain 11/14/2014  . 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 Chest wall pain 03/21/2014  . Abnormal LFTs (liver function tests) 09/12/2013  . Breast cancer of upper-inner quadrant of right female breast ([place]) 08/18/2013  . 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 [place], [place], [address] Phone: [phone]   Fax:  [phone]  Name: [name] [name] MRN: [id number] Date of Birth: [birth year]

## 2015-04-20 ENCOUNTER — Ambulatory Visit (HOSPITAL_BASED_OUTPATIENT_CLINIC_OR_DEPARTMENT_OTHER): Payer: 59 | Admitting: Oncology

## 2015-04-20 ENCOUNTER — Ambulatory Visit: Payer: 59 | Admitting: Physical Therapy

## 2015-04-20 ENCOUNTER — Ambulatory Visit (HOSPITAL_COMMUNITY)
Admission: RE | Admit: 2015-04-20 | Discharge: 2015-04-20 | Disposition: A | Discharge status: Home / Self Care | Payer: 59 | Source: Ambulatory Visit | Attending: Oncology | Admitting: Oncology

## 2015-04-20 ENCOUNTER — Other Ambulatory Visit: Payer: Self-pay

## 2015-04-20 ENCOUNTER — Telehealth: Payer: Self-pay | Admitting: Oncology

## 2015-04-20 ENCOUNTER — Other Ambulatory Visit (HOSPITAL_BASED_OUTPATIENT_CLINIC_OR_DEPARTMENT_OTHER): Payer: 59

## 2015-04-20 DIAGNOSIS — C50211 Malignant neoplasm of upper-inner quadrant of right female breast: Secondary | ICD-10-CM

## 2015-04-20 DIAGNOSIS — C7951 Secondary malignant neoplasm of bone: Secondary | ICD-10-CM | POA: Insufficient documentation

## 2015-04-20 DIAGNOSIS — M545 Low back pain, unspecified: Secondary | ICD-10-CM

## 2015-04-20 DIAGNOSIS — C771 Secondary and unspecified malignant neoplasm of intrathoracic lymph nodes: Secondary | ICD-10-CM | POA: Diagnosis not present

## 2015-04-20 DIAGNOSIS — R079 Chest pain, unspecified: Secondary | ICD-10-CM | POA: Insufficient documentation

## 2015-04-20 DIAGNOSIS — R918 Other nonspecific abnormal finding of lung field: Secondary | ICD-10-CM | POA: Diagnosis not present

## 2015-04-20 DIAGNOSIS — M4186 Other forms of scoliosis, lumbar region: Secondary | ICD-10-CM | POA: Insufficient documentation

## 2015-04-20 DIAGNOSIS — M4184 Other forms of scoliosis, thoracic region: Secondary | ICD-10-CM | POA: Diagnosis not present

## 2015-04-20 DIAGNOSIS — G729 Myopathy, unspecified: Secondary | ICD-10-CM | POA: Diagnosis not present

## 2015-04-20 DIAGNOSIS — M6289 Other specified disorders of muscle: Secondary | ICD-10-CM

## 2015-04-20 DIAGNOSIS — C50929 Malignant neoplasm of unspecified site of unspecified male breast: Secondary | ICD-10-CM

## 2015-04-20 DIAGNOSIS — G893 Neoplasm related pain (acute) (chronic): Secondary | ICD-10-CM

## 2015-04-20 DIAGNOSIS — J90 Pleural effusion, not elsewhere classified: Secondary | ICD-10-CM | POA: Diagnosis not present

## 2015-04-20 DIAGNOSIS — Z86718 Personal history of other venous thrombosis and embolism: Secondary | ICD-10-CM

## 2015-04-20 DIAGNOSIS — B029 Zoster without complications: Secondary | ICD-10-CM | POA: Diagnosis not present

## 2015-04-20 DIAGNOSIS — I890 Lymphedema, not elsewhere classified: Secondary | ICD-10-CM

## 2015-04-20 LAB — CBC WITH DIFFERENTIAL/PLATELET
BASO%: 0.7 % (ref 0.0–2.0)
Basophils Absolute: 0 10*3/uL (ref 0.0–0.1)
EOS%: 2.1 % (ref 0.0–7.0)
Eosinophils Absolute: 0.1 10*3/uL (ref 0.0–0.5)
HCT: 38.2 % (ref 34.8–46.6)
HEMOGLOBIN: 13.7 g/dL (ref 11.6–15.9)
LYMPH#: 0.7 10*3/uL — AB (ref 0.9–3.3)
LYMPH%: 17.2 % (ref 14.0–49.7)
MCH: 32.4 pg (ref 25.1–34.0)
MCHC: 35.9 g/dL (ref 31.5–36.0)
MCV: 90.3 fL (ref 79.5–101.0)
MONO#: 0.4 10*3/uL (ref 0.1–0.9)
MONO%: 10.2 % (ref 0.0–14.0)
NEUT#: 3 10*3/uL (ref 1.5–6.5)
NEUT%: 69.8 % (ref 38.4–76.8)
NRBC: 0 % (ref 0–0)
Platelets: 282 10*3/uL (ref 145–400)
RBC: 4.23 10*6/uL (ref 3.70–5.45)
RDW: 13.7 % (ref 11.2–14.5)
WBC: 4.3 10*3/uL (ref 3.9–10.3)

## 2015-04-20 NOTE — Telephone Encounter (Signed)
Appointments made and avs printed for patient °

## 2015-04-20 NOTE — Therapy (Signed)
[place], [place], [address] Phone: [phone]   Fax:  [phone]  Physical Therapy Treatment  Patient Details  Name: [name] [name] MRN: [id number] Date of Birth: 30-Aug-1960 Referring Provider: Dr. Lurline Del  Encounter Date: 04/20/2015      PT End of Session - 04/20/15 1214    Visit Number 11   Number of Visits 24   Date for PT Re-Evaluation 06/25/15   PT Start Time 1020   PT Stop Time 1105   PT Time Calculation (min) 45 min   Activity Tolerance Patient tolerated treatment well   Behavior During Therapy [facility] for tasks assessed/performed      Past Medical History  Diagnosis Date  . Seizures ([place]) 2010    Isolated incident.  Marland Kitchen PONV (postoperative nausea and vomiting)   . Peripheral vascular disease ([place]) 02/2010    blood clot related to porta cath  . S/P radiation therapy 07/17/2014 through 08/02/2014     Left mediastinum, left seventh rib 3250 cGy in 13 sessions   . S/P radiation therapy 12/11/2014 through 12/22/2014     Left parietal calvarium 2400 cGy in 8 sessions   . Breast cancer ([place]) dx'd 2005/2011  . Bone metastases ([place]) dx'd 05/2014    Past Surgical History  Procedure Laterality Date  . Breast lumpectomy  2005  . Axillary lymph node dissection  Dec. 2011  . Portacath placement  12/11  . Removal portacath    . Mediastinotomy chamberlain mcneil Left 06/02/2013    Procedure: MEDIASTINOTOMY CHAMBERLAIN MCNEIL;  Surgeon: [name], MD;  Location: [place];  Service: Thoracic;  Laterality: Left;  LEFT ANTERIOR MEDIASTINOTOMY     There were no vitals filed for this visit.  Visit Diagnosis:  Lymphedema  Muscle stiffness  Intermittent low back pain      Subjective Assessment - 04/20/15 1022    Currently in  Pain? Yes   Pain Score 6    Pain Location Axilla   Pain Orientation Right   Aggravating Factors  more swelling   Pain Relieving Factors less swelling   Pain Score 3   Pain Location Back   Pain Orientation Left   Pain Descriptors / Indicators Burning;Pressure   Aggravating Factors  not sure   Pain Relieving Factors Aleve   Effect of Pain on Daily Activities woke her up last night                         [facility] Adult PT Treatment/Exercise - 04/20/15 0001    Manual Therapy   Myofascial Release right axilla with focus at superior scar tightness   Manual Lymphatic Drainage (MLD) In left sidelying, posterior interaxillary anastomosis and right axillo-inguinal anastomosis; in supine, short neck, left axilla and anterior interaxillary anastomosis, right groin and axillo-inguinal anastomosis, area between right breast scars, directing toward pathways.  In right sidelying, left periscapular area toward left groin.   Other Manual Therapy soft tissue work to right thigh in right sidelying, for anteriomedial, medial, and posteriomedial aspects today                PT Education - 04/19/15 1307    Education provided No          PT Short Term Goals - 02/27/15 1011    PT SHORT TERM GOAL #1   Title pain with walking decreased >/= 25%   Time 3   Period Weeks   Status Achieved  PT SHORT TERM GOAL #2   Title pelvis stays in correct alignment for 3 weeks   Time 3   Period Weeks   Status Achieved           PT Long Term Goals - 04/17/15 1234    PT LONG TERM GOAL #1   Title indpendent with HEP   Time 6   Period Weeks   Status On-going  still learning exercises   PT LONG TERM GOAL #2   Title pain with walking decreased >/= 80%   Time 6   Period Weeks   Status On-going  6/10 pain with walking   PT LONG TERM GOAL #3   Title go up and down steps with a step over step pattern   Time 6   Period Weeks   Status On-going  not able to            [facility] Goals - 04/20/15 1218    CC Long Term Goal  #1   Title Reports left upper back and left side pain is controlled at 4/10 or less with therapy at the current frequency.   Status On-going   CC Long Term Goal  #2   Title Pt. will report swelling is adequately managed to enable ADL function at a consistent level.   Status On-going            Plan - 04/20/15 1216    Clinical Impression Statement Patient with ongoing lymphedema at right lateral breast as well as left back today; left back swelling is visible.  Continues to benefit from treatment as she recovers from shingles and can't use Flexitouch.  Other painful areas today including the left back and flank, that haven't been painful recently.   Pt will benefit from skilled therapeutic intervention in order to improve on the following deficits Increased edema;Pain;Increased muscle spasms   Rehab Potential Good   Clinical Impairments Affecting Rehab Potential active cancer   PT Frequency 2x / week   PT Duration 12 weeks   PT Treatment/Interventions Manual lymph drainage;Manual techniques   PT Next Visit Plan manual lymph drainage and manual therapy for cancer rehab   Consulted and Agree with Plan of Care Patient        Problem List Patient Active Problem List   Diagnosis Date Noted  . 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 Zoster 04/04/2015  . Nausea with vomiting 11/18/2014  . Constipation 11/18/2014  . Left-sided thoracic back pain   . Bone metastases ([place]) 11/16/2014  . Back pain 11/15/2014  . Uncontrolled pain 11/14/2014  . 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 Chest wall pain 03/21/2014  . Abnormal LFTs (liver function tests) 09/12/2013  . Breast cancer of upper-inner quadrant of right female breast ([place]) 08/18/2013  . 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

## 2015-04-20 NOTE — Progress Notes (Signed)
[name] [employer]  [phone] [phone]     ID: [name] [name] OB: Aug 31, 1960  MR#: [id number]  RDE#:[id number]  PCP: Pcp Not In System GYN:  [name] [name] SU:  OTHER MD: [name] [name], [name] [name], [name] [name], [name] [name], [name] [name], [name] [name]  CHIEF COMPLAINT: Stage IV breast cancer  CURRENT TREATMENT: Fulvestrant, (denosumab,) (palbociclib)  BREAST CANCER HISTORY: From doctor [name] [name]'s intake note 03/20/2004:  "The patient is a very pleasant 55 year old female, without significant past medical history.  Her family history is significant for a sister who at age 91 was diagnosed with invasive ductal carcinoma.  She is a breast cancer survivor at age 80 now.  The patient states that she has never really had a screening mammogram until October 2005, when she felt that it was time for her to start having mammograms done on a yearly basis.  Therefore, on 01/26/04, she underwent a screening mammogram and an abnormality was detected in the upper outer right breast.  She, therefore, underwent spot compression views of both the right and the left breast.  The left breast revealed a well-defined mass in the upper outer left quadrant, present at the 2 o'clock position, measuring 1.8 cm, 6 cm from the nipple.  This, by ultrasound, was felt to be a simple cyst measuring 1.8 cm.  On the right breast, a spiculated mass was noted in the upper outer right quadrant.  The ultrasound revealed a shadowing irregular solid mass at the 10:30 position, 9 cm from the nipple, measuring 1.2 cm in greatest dimension, correlating with the spiculated mass seen on the mammogram.  The right axilla was negative ultrasonically.  Because of this, the patient underwent a needle biopsy of the right breast and the biopsy was positive invasive mammary carcinoma that showed features consistent with a high-grade invasive ductal carcinoma associated with desmoplastic stroma.   No in situ component was seen and no definite lymphovascular invasion was identified.  On the core biopsy, the tumor measured about 0.8 cm.  Because of this, she was seen by Dr. [name] and the patient was taken to the [facility] [facility] on March 15, 2004.  She underwent a right breast lumpectomy with sentinel node biopsy.  The final pathology revealed an invasive ductal carcinoma, measuring 1.7 cm, grade 2 of 3.  Margins were free of tumor.  Atypical lobular hyperplasia was noted.  One sentinel node was removed which was negative for metastatic disease.  The tumor was staged at T1c, N0 MX.  It was estrogen receptor positive, progesterone receptor positive.  HER-2/neu was 2+.  FISH was negative.  All margins were free of tumor.  She is now seen in Medical Oncology for further evaluation and management of this newly diagnosed T1c, node negative, stage I, invasive ductal carcinoma of the right breast."  Her subsequent history is as detailed below  INTERVAL HISTORY: [name] [name] returns today for follow-up of her metastatic estrogen receptor positive breast cancer accompanied by her husband, [name] [name].  They both expressed appreciation regarding the hour-long meeting we had earlier this week where we arranged for her scans to be read by single radiologist. This morning she took a dose of Palbociclib. So far she appears to be tolerating it quite well. Of course she continues on monthly fulvestrant. She has not yet felt comfortable enough to start the Denosumab   REVIEW OF SYSTEMS:  [name] [name] woke up this morning around 1 AM with a pain across her back which was "terrible". She took  an Aleve and that has helped considerably. The pain was initially bilateral but later localized to the left postrior chest wall (where she also has shingles).  She thinks this pain however is different from her shingles pain, which is more of a burning pain. That also is milder. Finally she has a third pain , in her perineal area , which is the one for  which she received radiation. That is considerably better. Unfortunately of course some of the bone marrow bearing bone had to be irradiated.  She continues to limp when she walks , but so far they have been no mouth sores, no diarrhea, no new rash, no unusual headaches, no visual changes, no cough, phlegm production, or pleurisy, or worsening shortness of breath. She denies headaches or visual changes. A detailed review of systems today was otherwise stable  PAST MEDICAL HISTORY: Past Medical History  Diagnosis Date  . Seizures ([place]) 2010    Isolated incident.  Marland Kitchen PONV (postoperative nausea and vomiting)   . Peripheral vascular disease ([place]) 02/2010    blood clot related to porta cath  . S/P radiation therapy 07/17/2014 through 08/02/2014     Left mediastinum, left seventh rib 3250 cGy in 13 sessions   . S/P radiation therapy 12/11/2014 through 12/22/2014     Left parietal calvarium 2400 cGy in 8 sessions   . Breast cancer ([place]) dx'd 2005/2011  . Bone metastases ([place]) dx'd 05/2014    PAST SURGICAL HISTORY: Past Surgical History  Procedure Laterality Date  . Breast lumpectomy  2005  . Axillary lymph node dissection  Dec. 2011  . Portacath placement  12/11  . Removal portacath    . Mediastinotomy chamberlain mcneil Left 06/02/2013    Procedure: MEDIASTINOTOMY CHAMBERLAIN MCNEIL;  Surgeon: [name], MD;  Location: [facility] OR;  Service: Thoracic;  Laterality: Left;  LEFT ANTERIOR MEDIASTINOTOMY     FAMILY HISTORY Family History  Problem Relation Age of Onset  . COPD Mother   . Breast cancer Sister 75   The patient's father is living, 46 years old as of may 2015. He lives in [place]. The patient's mother died from complications of COPD at the age of 33. These has 2 brothers, one sister. Her sister developed breast cancer at the age  of 35. She is doing well. The patient herself underwent genetic testing at [facility] in 2011 and was found to be BRCA negative  GYNECOLOGIC HISTORY:  Menarche age 53, she is GX P0. She stopped having periods with her initial chemotherapy in 2006.  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 Her husband [name] [name] is a Medical illustrator in [place]. He has a child from a prior marriage. At home they have 2 rescue dogs, Hobo and [place]. The patient is religious but not a church attender    ADVANCED DIRECTIVES: In place; at the 08/04/2014 visit in particular the patient was very clear, with her husband present, that she would not want any kind of feeding tubes or "other tubes" if her condition deteriorated.   HEALTH MAINTENANCE: Social History  Substance Use Topics  . Smoking status: Never Smoker   . Smokeless tobacco: Never Used  . Alcohol Use: No     Colonoscopy:  PAP:  Bone density: March 2015; mild osteopenia  Lipid panel:  Allergies  Allergen Reactions  . 2nd Skin Quick Heal Other (See Comments)    Other Reaction: Skin peels  .  Decadron [Dexamethasone] Other (See Comments)    Patient does not tolerate steroids.   . Dilaudid [Hydromorphone] Nausea And Vomiting  . Enoxaparin Other (See Comments)    unknown  . Fluconazole Swelling    Liver toxicity  . Hydromorphone Hcl Nausea And Vomiting  . Morphine And Related Nausea And Vomiting  . Protonix [Pantoprazole Sodium] Other (See Comments)    Patient reports it caused thrush.  . Tegaderm Ag Mesh [Silver]     Current Outpatient Prescriptions  Medication Sig Dispense Refill  . acetaminophen (TYLENOL) 160 mg/5 mL SOLN Reported on 03/27/2015    . ALPRAZolam (XANAX) 0.5 MG tablet Take 1 tablet (0.5 mg total) by mouth 2 (two) times daily as needed for anxiety. 30 tablet 0  . B Complex-C (B-COMPLEX WITH VITAMIN C) tablet Take 1 tablet by mouth daily. Reported on  03/27/2015    . cholecalciferol 2000 UNITS tablet Take 1 tablet (2,000 Units total) by mouth daily.    . folic acid (FOLVITE) 1 MG tablet Take 1 tablet (1 mg total) by mouth daily.    Marland Kitchen ibuprofen (ADVIL,MOTRIN) 200 MG tablet Take 400 mg by mouth every 4 (four) hours as needed for moderate pain. Reported on 03/27/2015    . magnesium chloride (SLOW-MAG) 64 MG TBEC SR tablet Take 60 mg by mouth daily.    . Melatonin 3 MG TABS Take 3 mg by mouth at bedtime.    . naproxen sodium (ANAPROX) 220 MG tablet Take 220 mg by mouth 2 (two) times daily with a meal.    . palbociclib (IBRANCE) 75 MG capsule Take 1 capsule (75 mg total) by mouth daily with breakfast. Take whole with food. 11 capsule 0  . saccharomyces boulardii (FLORASTOR) 250 MG capsule Take 250 mg by mouth daily.     . valACYclovir (VALTREX) 1000 MG tablet Take 1 tablet (1,000 mg total) by mouth 3 (three) times daily. 21 tablet 0   No current facility-administered medications for this visit.    OBJECTIVE: Middle-aged white woman who appears stated age There were no vitals filed for this visit.   There is no weight on file to calculate BMI.   There were no vitals filed for this visit.    Patient refused vitals today 04/20/2015     ECOG FS:1 - Symptomatic but completely ambulatory  Sclerae unicteric, EOMs intact Oropharynx clear, dentition in good repair No cervical or supraclavicular adenopathy Lungs no rales or rhonchi Heart regular rate and rhythm Abd soft, nontender, positive bowel sounds MSK no focal spinal tenderness, no upper extremity lymphedema Neuro: nonfocal, well oriented,  Anxious affect Breasts:  deferred Skin:  The skin rash along the T4 dermatome on the left is considerably improved. The rash on the back , previously imaged, has almost completely resolved ; the lesions are now faint and flat. There is no crusting over. The initial area of the rash is also resolving. This is imaged below.       LAB  RESULTS:   CMP     Component Value Date/Time   NA 135* 04/16/2015 1200   NA 140 12/14/2014 0800   K 3.8 04/16/2015 1200   K 3.6 12/14/2014 0800   CL 103 12/14/2014 0800   CL 105 05/06/2012 1333   CO2 26 04/16/2015 1200   CO2 28 12/14/2014 0800   GLUCOSE 74 04/16/2015 1200   GLUCOSE 100* 12/14/2014 0800   GLUCOSE 124* 05/06/2012 1333   BUN 11.2 04/16/2015 1200   BUN 6 12/14/2014 0800  CREATININE 0.7 04/16/2015 1200   CREATININE 0.64 12/14/2014 0800   CALCIUM 9.6 04/16/2015 1200   CALCIUM 9.6 12/14/2014 0800   PROT 7.3 04/16/2015 1200   PROT 5.6* 11/20/2014 0433   ALBUMIN 3.8 04/16/2015 1200   ALBUMIN 2.6* 11/20/2014 0433   AST 28 04/16/2015 1200   AST 28 11/20/2014 0433   ALT 42 04/16/2015 1200   ALT 62* 11/20/2014 0433   ALKPHOS 95 04/16/2015 1200   ALKPHOS 105 11/20/2014 0433   BILITOT <0.30 04/16/2015 1200   BILITOT 0.7 11/20/2014 0433   GFRNONAA >60 12/14/2014 0800   GFRAA >60 12/14/2014 0800    No results found for: SPEP  Lab Results  Component Value Date   WBC 4.3 04/20/2015   NEUTROABS 3.0 04/20/2015   HGB 13.7 04/20/2015   HCT 38.2 04/20/2015   MCV 90.3 04/20/2015   PLT 282 04/20/2015      Chemistry      Component Value Date/Time   NA 135* 04/16/2015 1200   NA 140 12/14/2014 0800   K 3.8 04/16/2015 1200   K 3.6 12/14/2014 0800   CL 103 12/14/2014 0800   CL 105 05/06/2012 1333   CO2 26 04/16/2015 1200   CO2 28 12/14/2014 0800   BUN 11.2 04/16/2015 1200   BUN 6 12/14/2014 0800   CREATININE 0.7 04/16/2015 1200   CREATININE 0.64 12/14/2014 0800      Component Value Date/Time   CALCIUM 9.6 04/16/2015 1200   CALCIUM 9.6 12/14/2014 0800   ALKPHOS 95 04/16/2015 1200   ALKPHOS 105 11/20/2014 0433   AST 28 04/16/2015 1200   AST 28 11/20/2014 0433   ALT 42 04/16/2015 1200   ALT 62* 11/20/2014 0433   BILITOT <0.30 04/16/2015 1200   BILITOT 0.7 11/20/2014 0433       Lab Results  Component Value Date   LABCA2 25 11/02/2007    No  components found for: [id number]  No results for input(s): INR in the last 168 hours.  Urinalysis    Component Value Date/Time   COLORURINE YELLOW 11/17/2014 0143   APPEARANCEUR CLOUDY* 11/17/2014 0143   LABSPEC 1.010 11/17/2014 0143   LABSPEC 1.005 09/12/2013 1542   PHURINE 6.5 11/17/2014 0143   PHURINE 6.0 09/12/2013 1542   GLUCOSEU NEGATIVE 11/17/2014 0143   GLUCOSEU Negative 09/12/2013 1542   HGBUR NEGATIVE 11/17/2014 0143   HGBUR Negative 09/12/2013 1542   BILIRUBINUR NEGATIVE 11/17/2014 0143   BILIRUBINUR Negative 09/12/2013 1542   KETONESUR NEGATIVE 11/17/2014 0143   KETONESUR Negative 09/12/2013 1542   PROTEINUR NEGATIVE 11/17/2014 0143   PROTEINUR Negative 09/12/2013 1542   UROBILINOGEN 0.2 11/17/2014 0143   UROBILINOGEN 0.2 09/12/2013 1542   NITRITE NEGATIVE 11/17/2014 0143   NITRITE Negative 09/12/2013 1542   LEUKOCYTESUR NEGATIVE 11/17/2014 0143   LEUKOCYTESUR Negative 09/12/2013 1542    STUDIES: Ct Abdomen Pelvis W Contrast  03/22/2015  CLINICAL DATA:  Followup metastatic right breast carcinoma. EXAM: CT ABDOMEN AND PELVIS WITH CONTRAST TECHNIQUE: Multidetector CT imaging of the abdomen and pelvis was performed using the standard protocol following bolus administration of intravenous contrast. CONTRAST:  134m OMNIPAQUE IOHEXOL 300 MG/ML  SOLN COMPARISON:  PET-CT on 10/30/2014 and 03/19/2015 FINDINGS: Lower chest: Pleural-based soft tissue nodules in the inferior left hemithorax and small left pleural effusion show no significant change. Hepatobiliary: Several new hypovascular lesions are seen in the right hepatic lobe, largest measuring 1.5 cm on image 23/ series 2, consistent liver metastases. Pancreas: No mass, inflammatory changes, or other significant abnormality.  Spleen: Within normal limits in size and appearance. Adrenals/Urinary Tract: No masses identified. No evidence of hydronephrosis. Stomach/Bowel: No evidence of obstruction, inflammatory process, or abnormal  fluid collections. Vascular/Lymphatic: Mild increase in retroperitoneal lymphadenopathy seen in the left paraaortic region, currently measuring 1.8 x 2.8 cm on image 26 compared to 1.5 x 2.5 cm previously. Reproductive: No mass or other significant abnormality. Other: None. Musculoskeletal: No suspicious bone lesions visualized by CT, although hypermetabolic bone lesions seen on most recent PET involving the right L2 transverse process and left sacrum are consistent with bone metastases. IMPRESSION: New small liver metastases compared to previous PET-CT on 10/30/2014. Minimal increase in left paraaortic retroperitoneal lymphadenopathy. Stable appearance of pleural-based metastatic disease in lower left hemithorax and small left pleural effusion. Small lumbosacral bone metastases demonstrated on most recent PET-CT of 03/19/2015 are not well visualized by CT. Electronically Signed   By: Earle Gell M.D.   On: 03/22/2015 12:03    ASSESSMENT: 55 y.o. [place] woman with stage IV breast cancer, history as follows  (1)  S/p Right lumpectomy and sentinel lymph node sampling 03/15/2004 for a pT1c pN0. Stage IA invasive ductal carcinoma, grade 2, estrogen receptor 95% positive, progesterone receptor 65% positive, HER-2 not amplified; additional surgery 04/25/2004 for seroma or clearance showed no residual tumor  (2) adjuvant chemotherapy with cyclophosphamide and doxorubicin every 21 days x4 completed 07/19/2004  (3) adjuvant radiation given under Dr. Donella Stade in [place] completed July 2006  (4) the patient opted against adjuvant antiestrogen therapy  (5) genetics testing showed no BRCA mutations  (6) biopsy of a palpable right axillary mass 10/24/2009 showed invasive ductal carcinoma, grade 3, estrogen receptor 100% positive, progesterone receptor 2% positive (alert score 5) HER-2 negative; no evidence of systemic disease on PET scanning  (7) completed 3 of 4 planned cycles of docetaxel and  cyclophosphamide September 2011, fourth cycle omitted because of marked elevations in liver function tests  (8) an right axillary lymph node dissection 03/06/2010 showed 3/8 lymph nodes removed to be involved by tumor, with extracapsular extension.  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, TTS-1 negative), estrogen receptor 80% positive, progesterone receptor 2% positive, HER-2 not amplified  (12) letrozole started March 2015-- tolerated with significant side effects, discontinued at the end of May 2015  (13) PET scan 08/16/2013 shows extensive left pleural metastatic disease and a large left pleural effusion that shifts cardiac and mediastinal structures to the right; adenopathy (celiac trunk, periadrenal, periaortic); and a left medial clavicular lesion; Status post left thoracentesis 08/16/2013 positive for adenocarcinoma, estrogen receptor positive, progesterone receptor negative.  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; everolimus was suggested as an available targeted agent  (18) exemestane started 03/31/2014, discontinued  10/31/2014 with evidence of progression  (a) everolimus added 04/03/2014 but not  tolerated (cytopenias, elevated LFTs) even at minimal doses; stopped 04/17/2014  (19) fulvestrant started 12/20/2014  (a) palbociclib added at very low dose 04/03/2015 (starting dose 75 mg weekly)    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 treated with Lovenox for 5-6 months  (c) right upper extremity lymphedema--receiving physical therapy  (d) hepatic steatosis with chronically elevated LFTs as well as unusual hepatic sensitivity to chemotherapy  (e) osteopenia with the lowest T score -1.6 on bone density scan 06/20/2013  (f) radiation oncology (Dr Valere Dross) has reviewed prior radiation records in case there is further mediastinal involvement with dysphagia etc in which case palliative XRT could be considered  (a) radiation to left mediastinum/ left 7th rib 3250 cGy in 13 sessions04/18/2016 through 08/02/2014  (b) radiation to T11 area: 22 Gy in 7 sessions, last dose 11/27/2014  (c) radiation left parietal scalp region to be completed 12/22/2014  (d) radiation to sacral area completed 04/09/2015  (g) chest wall and perineal pain--improved post radiation treatments  (h) zoster diagnosed 04/04/2015-- on valacyclovir   PLAN:  I spent a little over 40 minutes today with [name] [name] and her husband [name] [name] going over her complex situation.  Overall, however, I think things are beginning to straighten out.   First of all her zoster is resolving. She stopped the Valtrex after a total of 11 days, last dose 04/16/2014. She is having a little bit of postherpetic neuralgia in the left back section of the zoster , but it is mild.    the pain that woke her up last night may be related to her rib involvement by cancer or I am concerned it could be a new compression fracture. I'm going to obtain plain  films of her spine today to evaluate that further. Her concern is that the pain may be due to cancer growth in the pleura and that also needs to be evaluated. We will obtain a chest x-ray as well today   The third pain that she is experiencing, in the perineal area, is better. Radiation has helped.  At the same time there has been a steady decline in her white count. She has only had very intermittent doses of Palbociclib , so I don't think that is going to be the main reason for her counts dropping. It certainly could've contributed. However I think the main reason is going to be her radiation treatments. They have sterilize part of her blood cell making marrow. This will likely limit her ability  To make cells in may compromise her tolerance of Palbociclib or any other kind of treatment. We will simply have to keep an eye on this in the future.    we also discussed the Xgeva situation. She was very reluctant to receive Xgeva on the same day as fulvestrant. However she is also reluctant to have C metastases drawn because she is such a " difficult stick". After much discussion what we decided to do was to go ahead and do the Xgeva and fulvestrant together on the next dose day which will be February 8.   We are going to obtain labwork every Monday for the next 8 weeks and we will obtain her next set of liver tests on February 6.    if we are successful, she will start her last 2 weeks of Palbociclib January 24 and receive it on Monday Wednesday and Friday 4 those 2 weeks.  She will then be ready to start her first regular cycle, her second cycle of Palbociclib on February 14. She is very concerned about cost issues and I am alerting our oral pharmacy manager 2 see where she will be obtaining these drugs and what her co-pay will be now that she is in a new insurance year.   She is going to return to see me May 15 2015. That'll be the first day of her second cycle of Palbociclib if all goes well.  She  knows to call for any problems that may develop before next visit here.  Chauncey Cruel, MD   04/20/2015 11:32 AM

## 2015-04-23 ENCOUNTER — Ambulatory Visit: Payer: 59 | Admitting: Physical Therapy

## 2015-04-23 ENCOUNTER — Other Ambulatory Visit (HOSPITAL_BASED_OUTPATIENT_CLINIC_OR_DEPARTMENT_OTHER): Payer: 59

## 2015-04-23 DIAGNOSIS — M545 Low back pain, unspecified: Secondary | ICD-10-CM

## 2015-04-23 DIAGNOSIS — M6289 Other specified disorders of muscle: Secondary | ICD-10-CM

## 2015-04-23 DIAGNOSIS — C50211 Malignant neoplasm of upper-inner quadrant of right female breast: Secondary | ICD-10-CM | POA: Diagnosis not present

## 2015-04-23 DIAGNOSIS — C771 Secondary and unspecified malignant neoplasm of intrathoracic lymph nodes: Secondary | ICD-10-CM

## 2015-04-23 DIAGNOSIS — G729 Myopathy, unspecified: Secondary | ICD-10-CM | POA: Diagnosis not present

## 2015-04-23 DIAGNOSIS — I890 Lymphedema, not elsewhere classified: Secondary | ICD-10-CM

## 2015-04-23 DIAGNOSIS — J90 Pleural effusion, not elsewhere classified: Secondary | ICD-10-CM

## 2015-04-23 LAB — CBC WITH DIFFERENTIAL/PLATELET
BASO%: 0.8 % (ref 0.0–2.0)
BASOS ABS: 0 10*3/uL (ref 0.0–0.1)
EOS ABS: 0.1 10*3/uL (ref 0.0–0.5)
EOS%: 1.8 % (ref 0.0–7.0)
HEMATOCRIT: 39.6 % (ref 34.8–46.6)
HEMOGLOBIN: 13.7 g/dL (ref 11.6–15.9)
LYMPH#: 0.8 10*3/uL — AB (ref 0.9–3.3)
LYMPH%: 15.2 % (ref 14.0–49.7)
MCH: 32.1 pg (ref 25.1–34.0)
MCHC: 34.6 g/dL (ref 31.5–36.0)
MCV: 92.7 fL (ref 79.5–101.0)
MONO#: 0.4 10*3/uL (ref 0.1–0.9)
MONO%: 7.7 % (ref 0.0–14.0)
NEUT#: 3.8 10*3/uL (ref 1.5–6.5)
NEUT%: 74.5 % (ref 38.4–76.8)
PLATELETS: 247 10*3/uL (ref 145–400)
RBC: 4.27 10*6/uL (ref 3.70–5.45)
RDW: 14 % (ref 11.2–14.5)
WBC: 5.1 10*3/uL (ref 3.9–10.3)
nRBC: 0 % (ref 0–0)

## 2015-04-23 NOTE — Therapy (Signed)
[place], [place], [address] Phone: [phone]   Fax:  [phone]  Physical Therapy Treatment  Patient Details  Name: [name] [name] MRN: [id number] Date of Birth: January 30, 1961 Referring Provider: Dr. Lurline Del  Encounter Date: 04/23/2015      PT End of Session - 04/23/15 1208    Visit Number 12   Number of Visits 24   Date for PT Re-Evaluation 06/25/15   PT Start Time 1020   PT Stop Time 1106   PT Time Calculation (min) 46 min   Activity Tolerance Patient tolerated treatment well   Behavior During Therapy [facility] for tasks assessed/performed      Past Medical History  Diagnosis Date  . Seizures ([place]) 2010    Isolated incident.  Marland Kitchen PONV (postoperative nausea and vomiting)   . Peripheral vascular disease ([place]) 02/2010    blood clot related to porta cath  . S/P radiation therapy 07/17/2014 through 08/02/2014     Left mediastinum, left seventh rib 3250 cGy in 13 sessions   . S/P radiation therapy 12/11/2014 through 12/22/2014     Left parietal calvarium 2400 cGy in 8 sessions   . Breast cancer ([place]) dx'd 2005/2011  . Bone metastases ([place]) dx'd 05/2014    Past Surgical History  Procedure Laterality Date  . Breast lumpectomy  2005  . Axillary lymph node dissection  Dec. 2011  . Portacath placement  12/11  . Removal portacath    . Mediastinotomy chamberlain mcneil Left 06/02/2013    Procedure: MEDIASTINOTOMY CHAMBERLAIN MCNEIL;  Surgeon: [name], MD;  Location: [facility];  Service: Thoracic;  Laterality: Left;  LEFT ANTERIOR MEDIASTINOTOMY     There were no vitals filed for this visit.  Visit Diagnosis:  Lymphedema  Muscle stiffness  Intermittent low back pain      Subjective Assessment - 04/23/15 1023    Currently in  Pain? Yes   Pain Score 6    Pain Location Axilla   Pain Orientation Right   Pain Descriptors / Indicators Tightness   Pain Type Chronic pain   Aggravating Factors  more swelling   Pain Relieving Factors less swelling   Pain Score 4   Pain Location Leg   Pain Orientation Right                         [facility] Adult PT Treatment/Exercise - 04/23/15 0001    Manual Therapy   Myofascial Release right axilla with focus at superior scar tightness   Manual Lymphatic Drainage (MLD) In left sidelying, posterior interaxillary anastomosis and right axillo-inguinal anastomosis; in supine, short neck, left axilla and anterior interaxillary anastomosis, right groin and axillo-inguinal anastomosis, area between right breast scars, directing toward pathways.  In right sidelying, left periscapular area toward left groin.   Other Manual Therapy soft tissue work to right thigh in right sidelying, for anteriomedial, medial, and posteriomedial aspects                  PT Short Term Goals - 02/27/15 1011    PT SHORT TERM GOAL #1   Title pain with walking decreased >/= 25%   Time 3   Period Weeks   Status Achieved   PT SHORT TERM GOAL #2   Title pelvis stays in correct alignment for 3 weeks   Time 3   Period Weeks   Status Achieved  PT Long Term Goals - 04/17/15 1234    PT LONG TERM GOAL #1   Title indpendent with HEP   Time 6   Period Weeks   Status On-going  still learning exercises   PT LONG TERM GOAL #2   Title pain with walking decreased >/= 80%   Time 6   Period Weeks   Status On-going  6/10 pain with walking   PT LONG TERM GOAL #3   Title go up and down steps with a step over step pattern   Time 6   Period Weeks   Status On-going  not able to            [facility] Goals - 04/20/15 1218    CC Long Term Goal  #1   Title Reports left upper back and left side pain is controlled at 4/10 or less with therapy at the current frequency.    Status On-going   CC Long Term Goal  #2   Title Pt. will report swelling is adequately managed to enable ADL function at a consistent level.   Status On-going            Plan - 04/23/15 1209    Clinical Impression Statement Patient still with three areas of pain today.   Pt will benefit from skilled therapeutic intervention in order to improve on the following deficits Increased edema;Pain;Increased muscle spasms   Rehab Potential Good   Clinical Impairments Affecting Rehab Potential active cancer   PT Frequency 2x / week   PT Duration 12 weeks   PT Treatment/Interventions Manual lymph drainage;Manual techniques   PT Next Visit Plan manual lymph drainage and manual therapy for cancer rehab   Consulted and Agree with Plan of Care Patient        Problem List Patient Active Problem List   Diagnosis Date Noted  . 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 Zoster 04/04/2015  . Nausea with vomiting 11/18/2014  . Constipation 11/18/2014  . Left-sided thoracic back pain   . Bone metastases ([place]) 11/16/2014  . Back pain 11/15/2014  . Uncontrolled pain 11/14/2014  . 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 Chest wall pain 03/21/2014  . Abnormal LFTs (liver function tests) 09/12/2013  . Breast cancer of upper-inner quadrant of right female breast ([place]) 08/18/2013  . 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

## 2015-04-24 ENCOUNTER — Ambulatory Visit: Payer: 59 | Admitting: Physical Therapy

## 2015-04-24 ENCOUNTER — Encounter: Payer: Self-pay | Admitting: Physical Therapy

## 2015-04-24 DIAGNOSIS — M6289 Other specified disorders of muscle: Secondary | ICD-10-CM

## 2015-04-24 DIAGNOSIS — G729 Myopathy, unspecified: Secondary | ICD-10-CM | POA: Diagnosis not present

## 2015-04-24 DIAGNOSIS — M25551 Pain in right hip: Secondary | ICD-10-CM

## 2015-04-24 DIAGNOSIS — R1031 Right lower quadrant pain: Secondary | ICD-10-CM

## 2015-04-24 NOTE — Therapy (Signed)
[facility] [address], [place], [place], [address] Phone: [phone]   Fax:  [phone]  Physical Therapy Treatment  Patient Details  Name: [name] [name] MRN: [id number] Date of Birth: 1961/01/05 Referring Provider: Dr. Lurline Del  Encounter Date: 04/24/2015      PT End of Session - 04/24/15 1237    Visit Number 13  pelvis   Date for PT Re-Evaluation 06/25/15   Authorization - Visit Number 60   Authorization - Number of Visits 13   PT Start Time [id number]   PT Stop Time 1313   PT Time Calculation (min) 38 min   Activity Tolerance Patient tolerated treatment well   Behavior During Therapy [facility] for tasks assessed/performed      Past Medical History  Diagnosis Date  . Seizures ([place]) 2010    Isolated incident.  Marland Kitchen PONV (postoperative nausea and vomiting)   . Peripheral vascular disease ([place]) 02/2010    blood clot related to porta cath  . S/P radiation therapy 07/17/2014 through 08/02/2014     Left mediastinum, left seventh rib 3250 cGy in 13 sessions   . S/P radiation therapy 12/11/2014 through 12/22/2014     Left parietal calvarium 2400 cGy in 8 sessions   . Breast cancer ([place]) dx'd 2005/2011  . Bone metastases ([place]) dx'd 05/2014    Past Surgical History  Procedure Laterality Date  . Breast lumpectomy  2005  . Axillary lymph node dissection  Dec. 2011  . Portacath placement  12/11  . Removal portacath    . Mediastinotomy chamberlain mcneil Left 06/02/2013    Procedure: MEDIASTINOTOMY CHAMBERLAIN MCNEIL;  Surgeon: [name], MD;  Location: [facility];  Service: Thoracic;  Laterality: Left;  LEFT ANTERIOR MEDIASTINOTOMY     There were no vitals filed for this visit.  Visit Diagnosis:  Muscle stiffness  Groin pain, right  Pain in joint  involving right pelvic region and thigh      Subjective Assessment - 04/24/15 1238    Subjective Pain and limp increases with more walking.  Patient reports her pain has decreased overall by 30% since last week.  Pain is not as pronounced. Patient reports less shooting pain in groin.    How long can you walk comfortably? make patient limp   Diagnostic tests x rays of back show no fractures;    Patient Stated Goals walking with minimal pain and to get relief from lymphedema at axilla and anterior chest    Currently in Pain? Yes   Pain Score 2    Pain Location Groin   Pain Orientation Right   Pain Descriptors / Indicators Shooting   Pain Type Chronic pain   Pain Onset More than a month ago   Aggravating Factors  walking in the store increases limp   Pain Relieving Factors rest                      Pelvic Floor Special Questions - 04/24/15 0001    Pelvic Floor Internal Exam Patient approves physical therapist to perform pelvic floor muscle assessment   Exam Type Vaginal           [facility] Adult PT Treatment/Exercise - 04/24/15 0001    Manual Therapy   Manual Therapy Soft tissue mobilization   Soft tissue mobilization medial aspect of right ischiotuberosity   Internal Pelvic Floor right obturator internist, right puborectalis, right iliococcygeus and introitus on right  PT Short Term Goals - 02/27/15 1011    PT SHORT TERM GOAL #1   Title pain with walking decreased >/= 25%   Time 3   Period Weeks   Status Achieved   PT SHORT TERM GOAL #2   Title pelvis stays in correct alignment for 3 weeks   Time 3   Period Weeks   Status Achieved           PT Long Term Goals - 04/24/15 1241    PT LONG TERM GOAL #1   Title indpendent with HEP   Time 6   Period Weeks   Status On-going   PT LONG TERM GOAL #2   Title pain with walking decreased >/= 80%   Time 6   Period Weeks   Status On-going  overall decreased by 30%   PT LONG TERM  GOAL #3   Title go up and down steps with a step over step pattern   Time 6   Period Weeks   Status On-going  avoiding           Long Term Clinic Goals - 04/20/15 1218    CC Long Term Goal  #1   Title Reports left upper back and left side pain is controlled at 4/10 or less with therapy at the current frequency.   Status On-going   CC Long Term Goal  #2   Title Pt. will report swelling is adequately managed to enable ADL function at a consistent level.   Status On-going            Plan - 04/24/15 1310    Clinical Impression Statement Patient is able to stand with equal weightbear on bilateral feet after therapy.  Patietn able to ambulate with 75% decrease in limp and walk longer with less pain.  Pain after treatment  1/10 with standing and walking is 3/10. Patient will benefit from phsycial therapy to reduce pain and limp with walking.    Pt will benefit from skilled therapeutic intervention in order to improve on the following deficits Increased edema;Pain;Increased muscle spasms   Rehab Potential Good   Clinical Impairments Affecting Rehab Potential active cancer   PT Frequency 2x / week   PT Duration 8 weeks   PT Treatment/Interventions Manual techniques;Therapeutic activities;Therapeutic exercise;Neuromuscular re-education;Patient/family education   PT Next Visit Plan soft tissue work   PT Home Exercise Plan progress as needed   Consulted and Agree with Plan of Care Patient   PT Plan Continue manual lymph drainage, soft tissue work.        Problem List Patient Active Problem List   Diagnosis Date Noted  . 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 Zoster 04/04/2015  . Nausea with vomiting 11/18/2014  . Constipation 11/18/2014  . Left-sided thoracic back pain   . Bone metastases ([place]) 11/16/2014  . Back pain 11/15/2014  . Uncontrolled pain 11/14/2014  . 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 Chest wall pain 03/21/2014  . Abnormal LFTs (liver  function tests) 09/12/2013  . Breast cancer of upper-inner quadrant of right female breast ([place]) 08/18/2013  . 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, [place], [place], [address] Phone: [phone]   Fax:  [phone]  Name: SHEA MCBETH MRN: [id number] Date of Birth: [birth year]

## 2015-04-25 ENCOUNTER — Other Ambulatory Visit: Payer: Self-pay | Admitting: Oncology

## 2015-04-25 ENCOUNTER — Telehealth: Payer: Self-pay | Admitting: *Deleted

## 2015-04-25 MED ORDER — PALBOCICLIB 75 MG PO CAPS: 75.0000 mg | ORAL_CAPSULE | Freq: Every day | ORAL | Status: DC

## 2015-04-25 NOTE — Telephone Encounter (Signed)
This RN spoke with pt per update on tolerance of Ibrance.  Note pt was diagnosed with shingles earlier this month.  Per phone discussion- main concern noted is increased fatigue with limitations or interuption of ADL's.  [facility] will complete this cycle Feb 7 and restart on the 14.  She stated concern related to " where will I get it and how much it will cost "  Jaice obtained last prescription from [facility] and would prefer to continue there.  This RN will inquire with [facility] and [facility] per above.

## 2015-04-26 ENCOUNTER — Ambulatory Visit: Payer: 59 | Admitting: Physical Therapy

## 2015-04-26 ENCOUNTER — Encounter: Payer: Self-pay | Admitting: Physical Therapy

## 2015-04-26 DIAGNOSIS — G729 Myopathy, unspecified: Secondary | ICD-10-CM | POA: Diagnosis not present

## 2015-04-26 DIAGNOSIS — R1031 Right lower quadrant pain: Secondary | ICD-10-CM

## 2015-04-26 DIAGNOSIS — M25551 Pain in right hip: Secondary | ICD-10-CM

## 2015-04-26 DIAGNOSIS — M6289 Other specified disorders of muscle: Secondary | ICD-10-CM

## 2015-04-26 NOTE — Therapy (Signed)
[facility] [address], [place], [place], [address] Phone: [phone]   Fax:  [phone]  Physical Therapy Treatment  Patient Details  Name: [name] [name] MRN: [id number] Date of Birth: 08-Apr-1960 Referring Provider: Dr. Lurline Del  Encounter Date: 04/26/2015      PT End of Session - 04/26/15 1234    Visit Number 14  pelvis   Date for PT Re-Evaluation 06/25/15   Authorization - Visit Number 8   Authorization - Number of Visits 14   PT Start Time 1232   PT Stop Time 1310   PT Time Calculation (min) 38 min   Activity Tolerance Patient tolerated treatment well   Behavior During Therapy [facility] for tasks assessed/performed      Past Medical History  Diagnosis Date  . Seizures ([place]) 2010    Isolated incident.  Marland Kitchen PONV (postoperative nausea and vomiting)   . Peripheral vascular disease ([place]) 02/2010    blood clot related to porta cath  . S/P radiation therapy 07/17/2014 through 08/02/2014     Left mediastinum, left seventh rib 3250 cGy in 13 sessions   . S/P radiation therapy 12/11/2014 through 12/22/2014     Left parietal calvarium 2400 cGy in 8 sessions   . Breast cancer ([place]) dx'd 2005/2011  . Bone metastases ([place]) dx'd 05/2014    Past Surgical History  Procedure Laterality Date  . Breast lumpectomy  2005  . Axillary lymph node dissection  Dec. 2011  . Portacath placement  12/11  . Removal portacath    . Mediastinotomy chamberlain mcneil Left 06/02/2013    Procedure: MEDIASTINOTOMY CHAMBERLAIN MCNEIL;  Surgeon: [name], MD;  Location: [facility];  Service: Thoracic;  Laterality: Left;  LEFT ANTERIOR MEDIASTINOTOMY     There were no vitals filed for this visit.  Visit Diagnosis:  Muscle stiffness  Groin pain, right  Pain in joint  involving right pelvic region and thigh      Subjective Assessment - 04/26/15 1235    Subjective I am tired from medication. I felt better for a few hours and limp begins but not as bad. Pressure from urinating is gone.    Pertinent History patient vomited just prior to treatment today   How long can you walk comfortably? make patient limp   Diagnostic tests x rays of back show no fractures;    Patient Stated Goals walking with minimal pain and to get relief from lymphedema at axilla and anterior chest    Currently in Pain? Yes   Pain Score 5   walking 7-8/10, move sidely to stomach 7-8/10   Pain Location Groin   Pain Orientation Right   Pain Descriptors / Indicators Shooting   Pain Type Chronic pain   Pain Onset More than a month ago   Pain Frequency Constant   Aggravating Factors  walking and has to stop walking due to sharp pain shooting; sidely to supine   Pain Relieving Factors rest   Multiple Pain Sites No                      Pelvic Floor Special Questions - 04/26/15 0001    Pelvic Floor Internal Exam Patient approves physical therapist to perform pelvic floor muscle assessment   Exam Type Vaginal           [facility] Adult PT Treatment/Exercise - 04/26/15 0001    Manual Therapy   Manual Therapy Soft tissue mobilization   Internal Pelvic  Floor bil.  obturator internist, bil.  puborectalis, left iliococcygeus and introitus on right and left    Other Manual Therapy soft tissue work to right hamstring, around right ischiotuberosity, right greater trochanter.                 PT Education - 04/26/15 1307    Education provided No          PT Short Term Goals - 02/27/15 1011    PT SHORT TERM GOAL #1   Title pain with walking decreased >/= 25%   Time 3   Period Weeks   Status Achieved   PT SHORT TERM GOAL #2   Title pelvis stays in correct alignment for 3 weeks   Time 3   Period Weeks   Status Achieved           PT Long Term Goals -  04/24/15 1241    PT LONG TERM GOAL #1   Title indpendent with HEP   Time 6   Period Weeks   Status On-going   PT LONG TERM GOAL #2   Title pain with walking decreased >/= 80%   Time 6   Period Weeks   Status On-going  overall decreased by 30%   PT LONG TERM GOAL #3   Title go up and down steps with a step over step pattern   Time 6   Period Weeks   Status On-going  avoiding           Long Term Clinic Goals - 04/20/15 1218    CC Long Term Goal  #1   Title Reports left upper back and left side pain is controlled at 4/10 or less with therapy at the current frequency.   Status On-going   CC Long Term Goal  #2   Title Pt. will report swelling is adequately managed to enable ADL function at a consistent level.   Status On-going            Plan - 04/26/15 1308    Clinical Impression Statement Patient pain has decreased to 3/10 after treatment.  Patient had trigger poiints located on left pelvic floor. Patient had severe pain palpated on right trochanter. Patient had no limp after treatment. Patient will benefit from skilled PT  to continue to reduce pain.    Pt will benefit from skilled therapeutic intervention in order to improve on the following deficits Increased edema;Pain;Increased muscle spasms   Rehab Potential Good   Clinical Impairments Affecting Rehab Potential active cancer   PT Frequency 2x / week   PT Duration 8 weeks   PT Treatment/Interventions Manual techniques;Therapeutic activities;Therapeutic exercise;Neuromuscular re-education;Patient/family education   PT Next Visit Plan soft tissue work   PT Home Exercise Plan progress as needed   Consulted and Agree with Plan of Care Patient   PT Plan Continue manual lymph drainage, soft tissue work.        Problem List Patient Active Problem List   Diagnosis Date Noted  . 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 Zoster 04/04/2015  . Nausea with vomiting 11/18/2014  . Constipation 11/18/2014  .  Left-sided thoracic back pain   . Bone metastases ([place]) 11/16/2014  . Back pain 11/15/2014  . Uncontrolled pain 11/14/2014  . 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 Chest wall pain 03/21/2014  . Abnormal LFTs (liver function tests) 09/12/2013  . Breast cancer of upper-inner quadrant of right female breast ([place]) 08/18/2013  . Secondary malignant neoplasm of mediastinal lymph node (  [place]) 08/18/2013    [name], PT 04/26/2015 1:18 PM   [facility] [address] [place], [place], [address] Phone: [phone]   Fax:  [phone]  Name: [name] [name] MRN: [id number] Date of Birth: 05/08/1960

## 2015-04-27 ENCOUNTER — Other Ambulatory Visit: Payer: Self-pay | Admitting: *Deleted

## 2015-04-27 ENCOUNTER — Ambulatory Visit: Payer: 59 | Admitting: Physical Therapy

## 2015-04-27 DIAGNOSIS — M25551 Pain in right hip: Secondary | ICD-10-CM

## 2015-04-27 DIAGNOSIS — M6289 Other specified disorders of muscle: Secondary | ICD-10-CM

## 2015-04-27 DIAGNOSIS — G729 Myopathy, unspecified: Secondary | ICD-10-CM | POA: Diagnosis not present

## 2015-04-27 DIAGNOSIS — I890 Lymphedema, not elsewhere classified: Secondary | ICD-10-CM

## 2015-04-27 MED ORDER — NYSTATIN 100000 UNIT/ML MT SUSP: 5.0000 mL | Freq: Four times a day (QID) | OROMUCOSAL | Status: DC

## 2015-04-27 NOTE — Therapy (Signed)
[place], [place], [address] Phone: [phone]   Fax:  [phone]  Physical Therapy Treatment  Patient Details  Name: [name] [name] MRN: [id number] Date of Birth: [birth year] Referring Provider: Dr. Lurline Del  Encounter Date: 04/27/2015      PT End of Session - 04/27/15 1603    Visit Number 15   Number of Visits 24   Date for PT Re-Evaluation 06/25/15   PT Start Time 1021   PT Stop Time 1105   PT Time Calculation (min) 44 min   Activity Tolerance Patient tolerated treatment well   Behavior During Therapy [facility] for tasks assessed/performed      Past Medical History  Diagnosis Date  . Seizures ([place]) 2010    Isolated incident.  Marland Kitchen PONV (postoperative nausea and vomiting)   . Peripheral vascular disease ([place]) 02/2010    blood clot related to porta cath  . S/P radiation therapy 07/17/2014 through 08/02/2014     Left mediastinum, left seventh rib 3250 cGy in 13 sessions   . S/P radiation therapy 12/11/2014 through 12/22/2014     Left parietal calvarium 2400 cGy in 8 sessions   . Breast cancer ([place]) dx'd 2005/2011  . Bone metastases ([place]) dx'd 05/2014    Past Surgical History  Procedure Laterality Date  . Breast lumpectomy  2005  . Axillary lymph node dissection  Dec. 2011  . Portacath placement  12/11  . Removal portacath    . Mediastinotomy chamberlain mcneil Left 06/02/2013    Procedure: MEDIASTINOTOMY CHAMBERLAIN MCNEIL;  Surgeon: [name], MD;  Location: [facility];  Service: Thoracic;  Laterality: Left;  LEFT ANTERIOR MEDIASTINOTOMY     There were no vitals filed for this visit.  Visit Diagnosis:  Lymphedema  Pain in joint involving right pelvic region and thigh  Muscle stiffness      Subjective Assessment -  04/27/15 1024    Subjective Having extreme fatigue and some nausea from the Ibrance.    Currently in Pain? Yes   Pain Score 5    Pain Location Axilla   Pain Orientation Right   Aggravating Factors  more swelling   Pain Relieving Factors less swelling                         [facility] Adult PT Treatment/Exercise - 04/27/15 0001    Manual Therapy   Myofascial Release right axilla with focus at superior scar tightness   Manual Lymphatic Drainage (MLD) In left sidelying, posterior interaxillary anastomosis and right axillo-inguinal anastomosis; in supine, short neck, left axilla and anterior interaxillary anastomosis, right groin and axillo-inguinal anastomosis, area between right breast scars, directing toward pathways.  In right sidelying, left periscapular area toward left groin.   Other Manual Therapy soft tissue work to right thigh in right sidelying, for anteriomedial, medial, and posteriomedial aspects                PT Education - 04/26/15 1307    Education provided No          PT Short Term Goals - 02/27/15 1011    PT SHORT TERM GOAL #1   Title pain with walking decreased >/= 25%   Time 3   Period Weeks   Status Achieved   PT SHORT TERM GOAL #2   Title pelvis stays in correct alignment for 3 weeks   Time 3   Period Weeks   Status Achieved  PT Long Term Goals - 04/24/15 1241    PT LONG TERM GOAL #1   Title indpendent with HEP   Time 6   Period Weeks   Status On-going   PT LONG TERM GOAL #2   Title pain with walking decreased >/= 80%   Time 6   Period Weeks   Status On-going  overall decreased by 30%   PT LONG TERM GOAL #3   Title go up and down steps with a step over step pattern   Time 6   Period Weeks   Status On-going  avoiding           Long Term Clinic Goals - 04/20/15 1218    CC Long Term Goal  #1   Title Reports left upper back and left side pain is controlled at 4/10 or less with therapy at the current  frequency.   Status On-going   CC Long Term Goal  #2   Title Pt. will report swelling is adequately managed to enable ADL function at a consistent level.   Status On-going            Plan - 04/27/15 1604    Clinical Impression Statement Right lateral and superior breast tissue swelling remains and continues to be indurated; left back and right leg pain continues.  All benefit from manual therapy techniques.   Pt will benefit from skilled therapeutic intervention in order to improve on the following deficits Increased edema;Pain;Increased muscle spasms   Rehab Potential Good   Clinical Impairments Affecting Rehab Potential active cancer   PT Frequency 2x / week   PT Duration 12 weeks   PT Treatment/Interventions Manual lymph drainage;Manual techniques   PT Next Visit Plan manual lymph drainage, soft tissue work   Consulted and Agree with Plan of Care Patient        Problem List Patient Active Problem List   Diagnosis Date Noted  . 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 Zoster 04/04/2015  . Nausea with vomiting 11/18/2014  . Constipation 11/18/2014  . Left-sided thoracic back pain   . Bone metastases ([place]) 11/16/2014  . Back pain 11/15/2014  . Uncontrolled pain 11/14/2014  . 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 Chest wall pain 03/21/2014  . Abnormal LFTs (liver function tests) 09/12/2013  . Breast cancer of upper-inner quadrant of right female breast ([place]) 08/18/2013  . 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

## 2015-04-30 ENCOUNTER — Ambulatory Visit: Payer: 59 | Admitting: Physical Therapy

## 2015-04-30 ENCOUNTER — Other Ambulatory Visit (HOSPITAL_BASED_OUTPATIENT_CLINIC_OR_DEPARTMENT_OTHER): Payer: 59

## 2015-04-30 DIAGNOSIS — M25551 Pain in right hip: Secondary | ICD-10-CM

## 2015-04-30 DIAGNOSIS — C771 Secondary and unspecified malignant neoplasm of intrathoracic lymph nodes: Secondary | ICD-10-CM

## 2015-04-30 DIAGNOSIS — G729 Myopathy, unspecified: Secondary | ICD-10-CM | POA: Diagnosis not present

## 2015-04-30 DIAGNOSIS — C50211 Malignant neoplasm of upper-inner quadrant of right female breast: Secondary | ICD-10-CM | POA: Diagnosis not present

## 2015-04-30 DIAGNOSIS — J90 Pleural effusion, not elsewhere classified: Secondary | ICD-10-CM

## 2015-04-30 DIAGNOSIS — M6289 Other specified disorders of muscle: Secondary | ICD-10-CM

## 2015-04-30 DIAGNOSIS — I890 Lymphedema, not elsewhere classified: Secondary | ICD-10-CM

## 2015-04-30 LAB — COMPREHENSIVE METABOLIC PANEL
ALBUMIN: 3.9 g/dL (ref 3.5–5.0)
ALK PHOS: 93 U/L (ref 40–150)
ALT: 34 U/L (ref 0–55)
ANION GAP: 9 meq/L (ref 3–11)
AST: 24 U/L (ref 5–34)
BILIRUBIN TOTAL: 0.3 mg/dL (ref 0.20–1.20)
BUN: 10.3 mg/dL (ref 7.0–26.0)
CO2: 26 mEq/L (ref 22–29)
Calcium: 9.6 mg/dL (ref 8.4–10.4)
Chloride: 103 mEq/L (ref 98–109)
Creatinine: 0.8 mg/dL (ref 0.6–1.1)
EGFR: 82 mL/min/{1.73_m2} — AB (ref >90)
GLUCOSE: 72 mg/dL (ref 70–140)
POTASSIUM: 4 meq/L (ref 3.5–5.1)
SODIUM: 138 meq/L (ref 136–145)
TOTAL PROTEIN: 7.4 g/dL (ref 6.4–8.3)

## 2015-04-30 LAB — CBC WITH DIFFERENTIAL/PLATELET
BASO%: 0.9 % (ref 0.0–2.0)
BASOS ABS: 0 10*3/uL (ref 0.0–0.1)
EOS ABS: 0.1 10*3/uL (ref 0.0–0.5)
EOS%: 2.4 % (ref 0.0–7.0)
HCT: 37.8 % (ref 34.8–46.6)
HEMOGLOBIN: 12.9 g/dL (ref 11.6–15.9)
LYMPH%: 15.3 % (ref 14.0–49.7)
MCH: 31.9 pg (ref 25.1–34.0)
MCHC: 34.2 g/dL (ref 31.5–36.0)
MCV: 93.1 fL (ref 79.5–101.0)
MONO#: 0.3 10*3/uL (ref 0.1–0.9)
MONO%: 6.8 % (ref 0.0–14.0)
NEUT%: 74.6 % (ref 38.4–76.8)
NEUTROS ABS: 3.1 10*3/uL (ref 1.5–6.5)
PLATELETS: 326 10*3/uL (ref 145–400)
RBC: 4.06 10*6/uL (ref 3.70–5.45)
RDW: 14.7 % — ABNORMAL HIGH (ref 11.2–14.5)
WBC: 4.1 10*3/uL (ref 3.9–10.3)
lymph#: 0.6 10*3/uL — ABNORMAL LOW (ref 0.9–3.3)

## 2015-04-30 NOTE — Therapy (Signed)
[place], [place], [address] Phone: [phone]   Fax:  [phone]  Physical Therapy Treatment  Patient Details  Name: [name] [name] MRN: [id number] Date of Birth: 06-Feb-1961 Referring Provider: Dr. Lurline Del  Encounter Date: 04/30/2015      PT End of Session - 04/30/15 1249    Visit Number 16   Number of Visits 24   Date for PT Re-Evaluation 06/25/15   PT Start Time 1016   PT Stop Time 1103   PT Time Calculation (min) 47 min   Activity Tolerance Patient tolerated treatment well   Behavior During Therapy [facility] for tasks assessed/performed      Past Medical History  Diagnosis Date  . Seizures ([place]) 2010    Isolated incident.  Marland Kitchen PONV (postoperative nausea and vomiting)   . Peripheral vascular disease ([place]) 02/2010    blood clot related to porta cath  . S/P radiation therapy 07/17/2014 through 08/02/2014     Left mediastinum, left seventh rib 3250 cGy in 13 sessions   . S/P radiation therapy 12/11/2014 through 12/22/2014     Left parietal calvarium 2400 cGy in 8 sessions   . Breast cancer ([place]) dx'd 2005/2011  . Bone metastases ([place]) dx'd 05/2014    Past Surgical History  Procedure Laterality Date  . Breast lumpectomy  2005  . Axillary lymph node dissection  Dec. 2011  . Portacath placement  12/11  . Removal portacath    . Mediastinotomy chamberlain mcneil Left 06/02/2013    Procedure: MEDIASTINOTOMY CHAMBERLAIN MCNEIL;  Surgeon: [name], MD;  Location: [facility];  Service: Thoracic;  Laterality: Left;  LEFT ANTERIOR MEDIASTINOTOMY     There were no vitals filed for this visit.  Visit Diagnosis:  Lymphedema  Pain in joint involving right pelvic region and thigh  Muscle stiffness      Subjective Assessment -  04/30/15 1019    Subjective Yesterday when getting into bed, felt a tear in pelvic region and it hurt.  This was after having done some walking on Saturday and resting on Sunday.     Currently in Pain? Yes   Pain Score 5    Pain Location Axilla   Pain Orientation Right   Aggravating Factors  more swelling   Pain Relieving Factors less swelling                         [facility] Adult PT Treatment/Exercise - 04/30/15 0001    Manual Therapy   Myofascial Release right axilla with focus at superior scar tightness   Manual Lymphatic Drainage (MLD) In left sidelying, posterior interaxillary anastomosis and right axillo-inguinal anastomosis; in supine, short neck, left axilla and anterior interaxillary anastomosis, right groin and axillo-inguinal anastomosis, area between right breast scars, directing toward pathways.  In right sidelying, left periscapular area toward left groin.   Other Manual Therapy soft tissue work to right thigh in right sidelying, for anteriomedial, medial, and posteriomedial aspects                  PT Short Term Goals - 02/27/15 1011    PT SHORT TERM GOAL #1   Title pain with walking decreased >/= 25%   Time 3   Period Weeks   Status Achieved   PT SHORT TERM GOAL #2   Title pelvis stays in correct alignment for 3 weeks   Time 3   Period Weeks   Status Achieved  PT Long Term Goals - 04/24/15 1241    PT LONG TERM GOAL #1   Title indpendent with HEP   Time 6   Period Weeks   Status On-going   PT LONG TERM GOAL #2   Title pain with walking decreased >/= 80%   Time 6   Period Weeks   Status On-going  overall decreased by 30%   PT LONG TERM GOAL #3   Title go up and down steps with a step over step pattern   Time 6   Period Weeks   Status On-going  avoiding           Long Term Clinic Goals - 04/20/15 1218    CC Long Term Goal  #1   Title Reports left upper back and left side pain is controlled at 4/10 or less with  therapy at the current frequency.   Status On-going   CC Long Term Goal  #2   Title Pt. will report swelling is adequately managed to enable ADL function at a consistent level.   Status On-going            Plan - 04/30/15 1249    Clinical Impression Statement Patient with increased hamstring and hip adductor tightness today from increased pelvic pain the occurred last night.  Still unable to try Flexitouch due to continue truncal burning from shingles.   Pt will benefit from skilled therapeutic intervention in order to improve on the following deficits Increased edema;Pain;Increased muscle spasms   Rehab Potential Good   Clinical Impairments Affecting Rehab Potential active cancer   PT Frequency 2x / week   PT Duration 12 weeks   PT Treatment/Interventions Manual lymph drainage;Manual techniques   PT Next Visit Plan manual lymph drainage, soft tissue work   Consulted and Agree with Plan of Care Patient        Problem List Patient Active Problem List   Diagnosis Date Noted  . 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 Zoster 04/04/2015  . Nausea with vomiting 11/18/2014  . Constipation 11/18/2014  . Left-sided thoracic back pain   . Bone metastases ([place]) 11/16/2014  . Back pain 11/15/2014  . Uncontrolled pain 11/14/2014  . 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 Chest wall pain 03/21/2014  . Abnormal LFTs (liver function tests) 09/12/2013  . Breast cancer of upper-inner quadrant of right female breast ([place]) 08/18/2013  . 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

## 2015-05-01 ENCOUNTER — Ambulatory Visit: Payer: 59 | Admitting: Physical Therapy

## 2015-05-01 ENCOUNTER — Other Ambulatory Visit: Payer: Self-pay | Admitting: Oncology

## 2015-05-01 ENCOUNTER — Encounter: Payer: Self-pay | Admitting: Physical Therapy

## 2015-05-01 DIAGNOSIS — M6289 Other specified disorders of muscle: Secondary | ICD-10-CM

## 2015-05-01 DIAGNOSIS — R1031 Right lower quadrant pain: Secondary | ICD-10-CM

## 2015-05-01 DIAGNOSIS — G729 Myopathy, unspecified: Secondary | ICD-10-CM | POA: Diagnosis not present

## 2015-05-01 DIAGNOSIS — C50211 Malignant neoplasm of upper-inner quadrant of right female breast: Secondary | ICD-10-CM

## 2015-05-01 MED ORDER — PALBOCICLIB 75 MG PO CAPS: 75.0000 mg | ORAL_CAPSULE | Freq: Every day | ORAL | Status: DC

## 2015-05-01 NOTE — Therapy (Signed)
[facility] [address], [place], [place], [address] Phone: [phone]   Fax:  [phone]  Physical Therapy Treatment  Patient Details  Name: [name] [name] MRN: [id number] Date of Birth: [birth year] Referring Provider: Dr. Lurline Del  Encounter Date: 05/01/2015      PT End of Session - 05/01/15 1405    Visit Number 17  pelvis   Number of Visits 24   Date for PT Re-Evaluation 06/25/15   Authorization - Visit Number 54   Authorization - Number of Visits 17   PT Start Time [id number]   PT Stop Time 1441   PT Time Calculation (min) 39 min   Activity Tolerance Patient tolerated treatment well   Behavior During Therapy [facility] for tasks assessed/performed      Past Medical History  Diagnosis Date  . Seizures ([place]) 2010    Isolated incident.  Marland Kitchen PONV (postoperative nausea and vomiting)   . Peripheral vascular disease ([place]) 02/2010    blood clot related to porta cath  . S/P radiation therapy 07/17/2014 through 08/02/2014     Left mediastinum, left seventh rib 3250 cGy in 13 sessions   . S/P radiation therapy 12/11/2014 through 12/22/2014     Left parietal calvarium 2400 cGy in 8 sessions   . Breast cancer ([place]) dx'd 2005/2011  . Bone metastases ([place]) dx'd 05/2014    Past Surgical History  Procedure Laterality Date  . Breast lumpectomy  2005  . Axillary lymph node dissection  Dec. 2011  . Portacath placement  12/11  . Removal portacath    . Mediastinotomy chamberlain mcneil Left 06/02/2013    Procedure: MEDIASTINOTOMY CHAMBERLAIN MCNEIL;  Surgeon: [name], MD;  Location: [facility];  Service: Thoracic;  Laterality: Left;  LEFT ANTERIOR MEDIASTINOTOMY     There were no vitals filed for this visit.  Visit Diagnosis:  Groin pain, right  Muscle  stiffness      Subjective Assessment - 05/01/15 1406    Subjective Therapy helps but the effects do not last.  Over the weekend I would move my leg and felt a ripping pain.  I have not felt it again. I had thrush in the back of my throat and my blood counts are good.    How long can you walk comfortably? make patient limp   Diagnostic tests x rays of back show no fractures;    Patient Stated Goals walking with minimal pain and to get relief from lymphedema at axilla and anterior chest    Currently in Pain? Yes   Pain Score 4   continous walking 7/10   Pain Location Groin   Pain Orientation Right   Pain Descriptors / Indicators Sharp   Pain Type Chronic pain   Pain Onset More than a month ago   Pain Frequency Constant   Aggravating Factors  movement of right leg, walking continously   Pain Relieving Factors soft tissue work   Effect of Pain on Daily Activities walking with a limp   Multiple Pain Sites No            [facility] PT Assessment - 05/01/15 0001    Strength   Right Hip ABduction 4/5   Palpation   SI assessment  pelvis in correct alignment   Palpation comment trigger point in right obturator internist, right SI joint, and right iliococcygeus                  Pelvic Floor Special Questions -  05/01/15 0001    Pelvic Floor Internal Exam Patient approves physical therapist to perform pelvic floor muscle assessment   Exam Type Vaginal           [facility] Adult PT Treatment/Exercise - 05/01/15 0001    Manual Therapy   Manual Therapy Internal Pelvic Floor;Soft tissue mobilization   Internal Pelvic Floor bil.  obturator internist, bil.  puborectalis, left iliococcygeus and introitus on right and left    Other Manual Therapy soft tissue work to right hamstring,lateral right hip,  around right ischiotuberosity, right greater trochanter.                 PT Education - 05/01/15 1437    Education provided No          PT Short Term Goals - 02/27/15 1011     PT SHORT TERM GOAL #1   Title pain with walking decreased >/= 25%   Time 3   Period Weeks   Status Achieved   PT SHORT TERM GOAL #2   Title pelvis stays in correct alignment for 3 weeks   Time 3   Period Weeks   Status Achieved           PT Long Term Goals - 05/01/15 1441    PT LONG TERM GOAL #1   Title indpendent with HEP   Time 6   Period Weeks   Status On-going   PT LONG TERM GOAL #2   Title pain with walking decreased >/= 80%   Time 6   Period Weeks   Status On-going   PT LONG TERM GOAL #3   Title go up and down steps with a step over step pattern   Time 6   Period Weeks   Status On-going           Long Term Clinic Goals - 04/20/15 1218    CC Long Term Goal  #1   Title Reports left upper back and left side pain is controlled at 4/10 or less with therapy at the current frequency.   Status On-going   CC Long Term Goal  #2   Title Pt. will report swelling is adequately managed to enable ADL function at a consistent level.   Status On-going            Plan - 05/01/15 1437    Clinical Impression Statement Patient has banding of posterior left vaginal introitus.  After therapy limp decreased by 50%.  Patient has less pressure and tightness in perineum after therapy.   Pt will benefit from skilled therapeutic intervention in order to improve on the following deficits Increased edema;Pain;Increased muscle spasms   Rehab Potential Good   Clinical Impairments Affecting Rehab Potential active cancer   PT Duration 8 weeks   PT Treatment/Interventions Manual techniques;Neuromuscular re-education;Patient/family education;Therapeutic activities;Therapeutic exercise   PT Next Visit Plan soft tissue work in the perineum   PT Home Exercise Plan progress as needed   Consulted and Agree with Plan of Care Patient   PT Plan Continue manual lymph drainage, soft tissue work.        Problem List Patient Active Problem List   Diagnosis Date Noted  . Malignant  pleural effusion, left 04/09/2015  . Zoster 04/04/2015  . Nausea with vomiting 11/18/2014  . Constipation 11/18/2014  . Left-sided thoracic back pain   . Bone metastases ([place]) 11/16/2014  . Back pain 11/15/2014  . Uncontrolled pain 11/14/2014  . 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 Chest wall  pain 03/21/2014  . Abnormal LFTs (liver function tests) 09/12/2013  . Breast cancer of upper-inner quadrant of right female breast ([place]) 08/18/2013  . 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 [place], [place], [address] Phone: [phone]   Fax:  [phone]  Name: [name] [name] MRN: [id number] Date of Birth: Nov 03, 1960

## 2015-05-03 ENCOUNTER — Encounter: Payer: Self-pay | Admitting: Physical Therapy

## 2015-05-03 ENCOUNTER — Ambulatory Visit: Payer: 59 | Attending: Oncology | Admitting: Physical Therapy

## 2015-05-03 ENCOUNTER — Telehealth: Payer: Self-pay | Admitting: *Deleted

## 2015-05-03 DIAGNOSIS — G729 Myopathy, unspecified: Secondary | ICD-10-CM | POA: Insufficient documentation

## 2015-05-03 DIAGNOSIS — M25551 Pain in right hip: Secondary | ICD-10-CM | POA: Diagnosis present

## 2015-05-03 DIAGNOSIS — R1031 Right lower quadrant pain: Secondary | ICD-10-CM | POA: Insufficient documentation

## 2015-05-03 DIAGNOSIS — M6289 Other specified disorders of muscle: Secondary | ICD-10-CM

## 2015-05-03 NOTE — Telephone Encounter (Signed)
This RN spoke with [name] [name] per her call stating " rash occurring ".  [name] [name] states she noticed a small area on her left check approximately a week ago that now is approximately the size of a dime.  Area is just a rash and not irritating.  Per further discussion she states thrush is " hanging on "- not improving but not getting worse.  Nausea and fatigue are primary side effects noted while on the I[place].  [name] [name] did state " I did take my pill this morning- faith not fear ".  [name] [name] inquired if rash was common with [name] [name]- discussed possibility but not common. Nor is her rash body wide which may be more of a reaction vs the isolated area she has noticed.  Plan at present is to monitor - she may use hydrocortisone cream and let us know if area worsens.  Other concern is noted left rib pain that is adjacent to area of shingles " but it kinda reminds me of how it felt when I had the fluid in there " " I think it is a nerve that is not happy "  Discussed above and noted per call- again plan with pt is to monitor and continue therapy at this time.  [name] [name] verbalized understanding to call if needed.

## 2015-05-03 NOTE — Therapy (Signed)
[facility] [address], [place], [place], [address] Phone: [phone]   Fax:  [phone]  Physical Therapy Treatment  Patient Details  Name: [name] [name] MRN: [id number] Date of Birth: July 27, 1960 Referring Provider: Dr. Lurline Del  Encounter Date: 05/03/2015      PT End of Session - 05/03/15 1233    Visit Number 18  pelvis   Date for PT Re-Evaluation 06/25/15   Authorization - Visit Number 35   Authorization - Number of Visits 18   PT Start Time 1230   PT Stop Time 1310   PT Time Calculation (min) 40 min   Activity Tolerance Patient tolerated treatment well   Behavior During Therapy [facility] for tasks assessed/performed      Past Medical History  Diagnosis Date  . Seizures ([place]) 2010    Isolated incident.  Marland Kitchen PONV (postoperative nausea and vomiting)   . Peripheral vascular disease ([place]) 02/2010    blood clot related to porta cath  . S/P radiation therapy 07/17/2014 through 08/02/2014     Left mediastinum, left seventh rib 3250 cGy in 13 sessions   . S/P radiation therapy 12/11/2014 through 12/22/2014     Left parietal calvarium 2400 cGy in 8 sessions   . Breast cancer ([place]) dx'd 2005/2011  . Bone metastases ([place]) dx'd 05/2014    Past Surgical History  Procedure Laterality Date  . Breast lumpectomy  2005  . Axillary lymph node dissection  Dec. 2011  . Portacath placement  12/11  . Removal portacath    . Mediastinotomy chamberlain mcneil Left 06/02/2013    Procedure: MEDIASTINOTOMY CHAMBERLAIN MCNEIL;  Surgeon: [name], MD;  Location: [facility];  Service: Thoracic;  Laterality: Left;  LEFT ANTERIOR MEDIASTINOTOMY     There were no vitals filed for this visit.  Visit Diagnosis:  Groin pain, right  Muscle stiffness      Subjective  Assessment - 05/03/15 1230    Subjective I felt better after therapy. i was limping yesterday due to walking more.  Today I am walking with less of a limp but I have tightness in the groin.  I have not had shooting pain since mondayy.    Patient Stated Goals walk 20 minutes with minimal limp   Currently in Pain? Yes   Pain Score 4   walking 6/10   Pain Location Groin   Pain Orientation Right   Pain Descriptors / Indicators Tightness   Pain Type Chronic pain   Pain Onset More than a month ago   Pain Frequency Intermittent   Aggravating Factors  walking   Pain Relieving Factors rest, soft tissue work   Effect of Pain on Daily Activities walking with  a limp   Multiple Pain Sites No                      Pelvic Floor Special Questions - 05/03/15 0001    Pelvic Floor Internal Exam Patient approves physical therapist to perform pelvic floor muscle assessment   Exam Type Vaginal           [facility] Adult PT Treatment/Exercise - 05/03/15 0001    Manual Therapy   Manual Therapy Internal Pelvic Floor   Internal Pelvic Floor bil.  obturator internist, bil.  puborectalis, left iliococcygeus and introitus on right and left    Other Manual Therapy soft tissue work to right hip  adductors, right pectineus, right hamstring, right quads  PT Education - 05/03/15 1300    Education provided No          PT Short Term Goals - 02/27/15 1011    PT SHORT TERM GOAL #1   Title pain with walking decreased >/= 25%   Time 3   Period Weeks   Status Achieved   PT SHORT TERM GOAL #2   Title pelvis stays in correct alignment for 3 weeks   Time 3   Period Weeks   Status Achieved           PT Long Term Goals - 05/01/15 1441    PT LONG TERM GOAL #1   Title indpendent with HEP   Time 6   Period Weeks   Status On-going   PT LONG TERM GOAL #2   Title pain with walking decreased >/= 80%   Time 6   Period Weeks   Status On-going   PT LONG TERM GOAL #3    Title go up and down steps with a step over step pattern   Time 6   Period Weeks   Status On-going           Long Term Clinic Goals - 04/20/15 1218    CC Long Term Goal  #1   Title Reports left upper back and left side pain is controlled at 4/10 or less with therapy at the current frequency.   Status On-going   CC Long Term Goal  #2   Title Pt. will report swelling is adequately managed to enable ADL function at a consistent level.   Status On-going            Plan - 05/03/15 1300    Clinical Impression Statement After therapy patient is feeling 50% less tightness after therapy in the left inner groin and medial right hip adductor by groin insertion.  Patient is unable to walk for 20 minutes due to limp gets worse.  Patient pain increases if she missess  4 days of therpay making her limp more.    Pt will benefit from skilled therapeutic intervention in order to improve on the following deficits Increased edema;Pain;Increased muscle spasms   Rehab Potential Good   Clinical Impairments Affecting Rehab Potential active cancer   PT Frequency 2x / week   PT Duration 8 weeks   PT Treatment/Interventions Manual techniques;Neuromuscular re-education;Patient/family education;Therapeutic activities;Therapeutic exercise   PT Next Visit Plan soft tissue work in the perineum   PT Home Exercise Plan progress as needed   Consulted and Agree with Plan of Care Patient   PT Plan Continue manual lymph drainage, soft tissue work.        Problem List Patient Active Problem List   Diagnosis Date Noted  . 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 Zoster 04/04/2015  . Nausea with vomiting 11/18/2014  . Constipation 11/18/2014  . Left-sided thoracic back pain   . Bone metastases ([place]) 11/16/2014  . Back pain 11/15/2014  . Uncontrolled pain 11/14/2014  . 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 Chest wall pain 03/21/2014  . Abnormal LFTs (liver function tests) 09/12/2013   . Breast cancer of upper-inner quadrant of right female breast ([place]) 08/18/2013  . 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 [place], [place], [address] Phone: [phone]   Fax:  [phone]  Name: DAVANEE COVERSTONE MRN: [id number] Date of Birth:  04/14/1960     

## 2015-05-04 ENCOUNTER — Ambulatory Visit: Payer: 59 | Attending: Oncology | Admitting: Physical Therapy

## 2015-05-04 DIAGNOSIS — M549 Dorsalgia, unspecified: Secondary | ICD-10-CM | POA: Diagnosis present

## 2015-05-04 DIAGNOSIS — I890 Lymphedema, not elsewhere classified: Secondary | ICD-10-CM | POA: Diagnosis present

## 2015-05-04 DIAGNOSIS — G729 Myopathy, unspecified: Secondary | ICD-10-CM | POA: Diagnosis present

## 2015-05-04 DIAGNOSIS — R1031 Right lower quadrant pain: Secondary | ICD-10-CM | POA: Insufficient documentation

## 2015-05-04 DIAGNOSIS — M25551 Pain in right hip: Secondary | ICD-10-CM | POA: Diagnosis present

## 2015-05-04 DIAGNOSIS — M6289 Other specified disorders of muscle: Secondary | ICD-10-CM

## 2015-05-04 NOTE — Therapy (Signed)
[place], [place], [address] Phone: [phone]   Fax:  [phone]  Physical Therapy Treatment  Patient Details  Name: [name] [name] MRN: [id number] Date of Birth: 1960/11/13 Referring Provider: Dr. Lurline Del  Encounter Date: 05/04/2015      PT End of Session - 05/04/15 1227    Visit Number 19   Number of Visits 24   Date for PT Re-Evaluation 06/25/15   PT Start Time [id number]   PT Stop Time 1110   PT Time Calculation (min) 47 min   Activity Tolerance Patient tolerated treatment well   Behavior During Therapy [facility] for tasks assessed/performed      Past Medical History  Diagnosis Date  . Seizures ([place]) 2010    Isolated incident.  Marland Kitchen PONV (postoperative nausea and vomiting)   . Peripheral vascular disease ([place]) 02/2010    blood clot related to porta cath  . S/P radiation therapy 07/17/2014 through 08/02/2014     Left mediastinum, left seventh rib 3250 cGy in 13 sessions   . S/P radiation therapy 12/11/2014 through 12/22/2014     Left parietal calvarium 2400 cGy in 8 sessions   . Breast cancer ([place]) dx'd 2005/2011  . Bone metastases ([place]) dx'd 05/2014    Past Surgical History  Procedure Laterality Date  . Breast lumpectomy  2005  . Axillary lymph node dissection  Dec. 2011  . Portacath placement  12/11  . Removal portacath    . Mediastinotomy chamberlain mcneil Left 06/02/2013    Procedure: MEDIASTINOTOMY CHAMBERLAIN MCNEIL;  Surgeon: [name], MD;  Location: [place];  Service: Thoracic;  Laterality: Left;  LEFT ANTERIOR MEDIASTINOTOMY     There were no vitals filed for this visit.  Visit Diagnosis:  Groin pain, right  Lymphedema  Muscle stiffness      Subjective Assessment - 05/04/15 1023    Subjective Next week's  an experiment:  I'm just going to cancel Monday and just come Friday, and see how I do. I think the right side swelling is generally worse.   Currently in Pain? Yes   Pain Score 6    Pain Location Axilla   Pain Orientation Right   Pain Descriptors / Indicators Other (Comment)  full, numb   Pain Score 5   Pain Location Groin   Pain Orientation Right   Aggravating Factors  walking                         [facility] Adult PT Treatment/Exercise - 05/04/15 0001    Manual Therapy   Myofascial Release right axilla with focus at superior scar tightness   Manual Lymphatic Drainage (MLD) In left sidelying, posterior interaxillary anastomosis and right axillo-inguinal anastomosis; in supine, short neck, left axilla and anterior interaxillary anastomosis, right groin and axillo-inguinal anastomosis, area between right breast scars, directing toward pathways, and right upper arm.  In right sidelying, left periscapular area toward left groin.   Other Manual Therapy soft tissue work at right thigh in right sidelying with focus on superior medial aspect.                PT Education - 05/03/15 1300    Education provided No          PT Short Term Goals - 02/27/15 1011    PT SHORT TERM GOAL #1   Title pain with walking decreased >/= 25%   Time 3   Period Weeks  Status Achieved   PT SHORT TERM GOAL #2   Title pelvis stays in correct alignment for 3 weeks   Time 3   Period Weeks   Status Achieved           PT Long Term Goals - 05/01/15 1441    PT LONG TERM GOAL #1   Title indpendent with HEP   Time 6   Period Weeks   Status On-going   PT LONG TERM GOAL #2   Title pain with walking decreased >/= 80%   Time 6   Period Weeks   Status On-going   PT LONG TERM GOAL #3   Title go up and down steps with a step over step pattern   Time 6   Period Weeks   Status On-going           Long Term Clinic Goals - 05/04/15 1231    CC Long Term Goal  #1   Title  Reports left upper back and left side pain is controlled at 4/10 or less with therapy at the current frequency.   Status On-going   CC Long Term Goal  #2   Title Pt. will report swelling is adequately managed to enable ADL function at a consistent level.   Status On-going            Plan - 05/04/15 1228    Clinical Impression Statement Right lateral breast area feels fuller, in part in that swelling now extends beyond the area just between the two scars (medial and lateral to that).  Patient wants to try having only one session next week because of a very busy schedule with medical appointments next week.   Pt will benefit from skilled therapeutic intervention in order to improve on the following deficits Increased edema;Pain;Increased muscle spasms   Rehab Potential Good   Clinical Impairments Affecting Rehab Potential active cancer   PT Frequency 2x / week   PT Duration 8 weeks   PT Treatment/Interventions Manual lymph drainage;Manual techniques   PT Next Visit Plan manual lymph drainage, soft tissue work   Consulted and Agree with Plan of Care Patient        Problem List Patient Active Problem List   Diagnosis Date Noted  . 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 Zoster 04/04/2015  . Nausea with vomiting 11/18/2014  . Constipation 11/18/2014  . Left-sided thoracic back pain   . Bone metastases ([place]) 11/16/2014  . Back pain 11/15/2014  . Uncontrolled pain 11/14/2014  . 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 Chest wall pain 03/21/2014  . Abnormal LFTs (liver function tests) 09/12/2013  . Breast cancer of upper-inner quadrant of right female breast ([place]) 08/18/2013  . 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

## 2015-05-07 ENCOUNTER — Ambulatory Visit: Payer: 59 | Admitting: Physical Therapy

## 2015-05-07 ENCOUNTER — Other Ambulatory Visit: Payer: 59

## 2015-05-08 ENCOUNTER — Other Ambulatory Visit (HOSPITAL_BASED_OUTPATIENT_CLINIC_OR_DEPARTMENT_OTHER): Payer: 59

## 2015-05-08 ENCOUNTER — Ambulatory Visit: Payer: 59 | Admitting: Physical Therapy

## 2015-05-08 ENCOUNTER — Telehealth: Payer: Self-pay

## 2015-05-08 ENCOUNTER — Encounter: Payer: Self-pay | Admitting: Physical Therapy

## 2015-05-08 DIAGNOSIS — C771 Secondary and unspecified malignant neoplasm of intrathoracic lymph nodes: Secondary | ICD-10-CM

## 2015-05-08 DIAGNOSIS — R1031 Right lower quadrant pain: Secondary | ICD-10-CM

## 2015-05-08 DIAGNOSIS — M6289 Other specified disorders of muscle: Secondary | ICD-10-CM

## 2015-05-08 DIAGNOSIS — C50211 Malignant neoplasm of upper-inner quadrant of right female breast: Secondary | ICD-10-CM

## 2015-05-08 DIAGNOSIS — J90 Pleural effusion, not elsewhere classified: Secondary | ICD-10-CM

## 2015-05-08 LAB — COMPREHENSIVE METABOLIC PANEL
ALBUMIN: 3.8 g/dL (ref 3.5–5.0)
ALK PHOS: 94 U/L (ref 40–150)
ALT: 35 U/L (ref 0–55)
AST: 26 U/L (ref 5–34)
Anion Gap: 9 mEq/L (ref 3–11)
BUN: 8.9 mg/dL (ref 7.0–26.0)
CHLORIDE: 103 meq/L (ref 98–109)
CO2: 26 meq/L (ref 22–29)
Calcium: 9.5 mg/dL (ref 8.4–10.4)
Creatinine: 0.8 mg/dL (ref 0.6–1.1)
EGFR: 88 mL/min/{1.73_m2} — AB (ref >90)
GLUCOSE: 88 mg/dL (ref 70–140)
POTASSIUM: 3.8 meq/L (ref 3.5–5.1)
SODIUM: 137 meq/L (ref 136–145)
Total Bilirubin: 0.37 mg/dL (ref 0.20–1.20)
Total Protein: 7.2 g/dL (ref 6.4–8.3)

## 2015-05-08 LAB — CBC WITH DIFFERENTIAL/PLATELET
BASO%: 0.8 % (ref 0.0–2.0)
BASOS ABS: 0 10*3/uL (ref 0.0–0.1)
EOS ABS: 0.1 10*3/uL (ref 0.0–0.5)
EOS%: 1.4 % (ref 0.0–7.0)
HCT: 36.6 % (ref 34.8–46.6)
HGB: 12.5 g/dL (ref 11.6–15.9)
LYMPH%: 15.9 % (ref 14.0–49.7)
MCH: 32 pg (ref 25.1–34.0)
MCHC: 34.2 g/dL (ref 31.5–36.0)
MCV: 93.6 fL (ref 79.5–101.0)
MONO#: 0.2 10*3/uL (ref 0.1–0.9)
MONO%: 5.1 % (ref 0.0–14.0)
NEUT#: 2.7 10*3/uL (ref 1.5–6.5)
NEUT%: 76.8 % (ref 38.4–76.8)
Platelets: 252 10*3/uL (ref 145–400)
RBC: 3.91 10*6/uL (ref 3.70–5.45)
RDW: 14.4 % (ref 11.2–14.5)
WBC: 3.5 10*3/uL — ABNORMAL LOW (ref 3.9–10.3)
lymph#: 0.6 10*3/uL — ABNORMAL LOW (ref 0.9–3.3)

## 2015-05-08 NOTE — Telephone Encounter (Signed)
Writer called patient as requested after showing Dr. Jana Hakim her labs.  He states that he is very "happy" with her lab results and to "keep up the good work".  Patient is worried about her WBC which is 3.5 and will address it next week to see where it is trending.

## 2015-05-08 NOTE — Therapy (Signed)
[facility] [address] [place], [place], [address] Phone: [phone]   Fax:  [phone]  Physical Therapy Treatment  Patient Details  Name: [name] [name] MRN: [id number] Date of Birth: 12-28-1960 Referring Provider: Dr. Lurline Del  Encounter Date: 05/08/2015      PT End of Session - 05/08/15 1235    Visit Number 20  pelvis   Date for PT Re-Evaluation 06/25/15   Authorization - Visit Number 72   Authorization - Number of Visits 20   PT Start Time 1232   PT Stop Time 1310   PT Time Calculation (min) 38 min   Activity Tolerance Patient tolerated treatment well   Behavior During Therapy [facility] for tasks assessed/performed      Past Medical History  Diagnosis Date  . Seizures ([place]) 2010    Isolated incident.  Marland Kitchen PONV (postoperative nausea and vomiting)   . Peripheral vascular disease ([place]) 02/2010    blood clot related to porta cath  . S/P radiation therapy 07/17/2014 through 08/02/2014     Left mediastinum, left seventh rib 3250 cGy in 13 sessions   . S/P radiation therapy 12/11/2014 through 12/22/2014     Left parietal calvarium 2400 cGy in 8 sessions   . Breast cancer ([place]) dx'd 2005/2011  . Bone metastases ([place]) dx'd 05/2014    Past Surgical History  Procedure Laterality Date  . Breast lumpectomy  2005  . Axillary lymph node dissection  Dec. 2011  . Portacath placement  12/11  . Removal portacath    . Mediastinotomy chamberlain mcneil Left 06/02/2013    Procedure: MEDIASTINOTOMY CHAMBERLAIN MCNEIL;  Surgeon: [name], MD;  Location: [place];  Service: Thoracic;  Laterality: Left;  LEFT ANTERIOR MEDIASTINOTOMY     There were no vitals filed for this visit.  Visit Diagnosis:  Groin pain, right  Muscle stiffness      Subjective  Assessment - 05/08/15 1236    Subjective My WBC count is a little low.    How long can you walk comfortably? make patient limp   Diagnostic tests x rays of back show no fractures;    Patient Stated Goals walk 20 minutes with minimal limp   Currently in Pain? Yes   Pain Score 6    Pain Location Groin   Pain Orientation Right   Pain Descriptors / Indicators Constant   Pain Type Chronic pain   Pain Onset More than a month ago   Pain Frequency Constant   Aggravating Factors  walking   Pain Relieving Factors rest, soft tissue work   Effect of Pain on Daily Activities walking with a limp   Multiple Pain Sites No                      Pelvic Floor Special Questions - 05/08/15 0001    Pelvic Floor Internal Exam Patient approves physical therapist to perform pelvic floor muscle assessment   Exam Type Vaginal           [facility] Adult PT Treatment/Exercise - 05/08/15 0001    Manual Therapy   Manual Therapy Soft tissue mobilization;Internal Pelvic Floor   Internal Pelvic Floor bil.  obturator internist, bil.  puborectalis, left iliococcygeus and introitus on right and left                 PT Education - 05/08/15 1313    Education provided Yes   Education Details pelvic floor contraction, flexibilty stretches  Person(s) Educated Patient   Methods Explanation;Demonstration;Verbal cues;Handout   Comprehension Verbalized understanding;Returned demonstration          PT Short Term Goals - 02/27/15 1011    PT SHORT TERM GOAL #1   Title pain with walking decreased >/= 25%   Time 3   Period Weeks   Status Achieved   PT SHORT TERM GOAL #2   Title pelvis stays in correct alignment for 3 weeks   Time 3   Period Weeks   Status Achieved           PT Long Term Goals - 05/08/15 1318    PT LONG TERM GOAL #1   Title indpendent with HEP   Time 6   Period Weeks   Status On-going   PT LONG TERM GOAL #2   Title pain with walking decreased >/= 80%   Time 6    Period Weeks   Status On-going   PT LONG TERM GOAL #3   Title go up and down steps with a step over step pattern   Time 6   Period Weeks   Status On-going           Long Term Clinic Goals - 05/04/15 1231    CC Long Term Goal  #1   Title Reports left upper back and left side pain is controlled at 4/10 or less with therapy at the current frequency.   Status On-going   CC Long Term Goal  #2   Title Pt. will report swelling is adequately managed to enable ADL function at a consistent level.   Status On-going            Plan - 05/08/15 1313    Clinical Impression Statement Patient has tightness in right perineal area. Palpable tenderness located in right pelvic floor muscles but patient feels it is not as tender. Pelvic floor strength is 4/5 with holding for 5 seconds.  When patient has increase pelvic pain it is difficulty for her to wear her special compression garment to decrease her lymphedema. Patient would benefit form physical therapy to reduce pain and improve walking.    Pt will benefit from skilled therapeutic intervention in order to improve on the following deficits Increased edema;Increased muscle spasms;Pain   Rehab Potential Good   Clinical Impairments Affecting Rehab Potential active cancer   PT Frequency 2x / week   PT Duration 8 weeks   PT Treatment/Interventions Neuromuscular re-education;Patient/family education;Therapeutic activities;Manual techniques   PT Next Visit Plan soft tissue work, stretches, pevic floor strength   PT Home Exercise Plan progress as needed   Consulted and Agree with Plan of Care Patient   PT Plan Continue manual lymph drainage, soft tissue work.        Problem List Patient Active Problem List   Diagnosis Date Noted  . 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 Zoster 04/04/2015  . Nausea with vomiting 11/18/2014  . Constipation 11/18/2014  . Left-sided thoracic back pain   . Bone metastases ([place]) 11/16/2014  . Back pain  11/15/2014  . Uncontrolled pain 11/14/2014  . 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 Chest wall pain 03/21/2014  . Abnormal LFTs (liver function tests) 09/12/2013  . Breast cancer of upper-inner quadrant of right female breast ([place]) 08/18/2013  . 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, [place], [place],  [id number] Phone: [phone]   Fax:  [phone]  Name: [name] [name] MRN: [id number] Date of Birth: 1961-03-19

## 2015-05-08 NOTE — Patient Instructions (Signed)
     Position yourself as shown grabbing onto feet or behind the knees. You should feel a gentle stretch. Breathe in and allow the pelvic floor muscles to relax.Just do one leg.  Hold for 15 sec. 2 times each leg.  Adductors, Sitting With Hip Flexion    Sit with legs open in a wide V, toes pointing up, hands on knees. Keep spine straight supporting trunk with arms. Slide arms down leg as trunk tips forward. Press knees apart. Hold _30__ seconds. Repeat _2__ times per session. Do _1__ sessions per day.  Copyright  VHI. All rights reserved.  Sitting: Bilateral    Sit with legs together in front, feet flexed. Bend forward from hips keeping spine straight and grasp big toes. Keep back straight. Hold _30__ seconds. Beginner: Grasp shins. Repeat _2__ times per session. Do _1__ sessions per day.  Copyright  VHI. All rights reserved.  Slow Contraction: Gravity Resisted (Sitting)    Sitting, slowly squeeze pelvic floor for _5__ seconds. Rest for _5__ seconds. Repeat __10_ times. Do _3__ times a day.  Copyright  VHI. All rights reserved.  [facility] [address], [place], [place] [address] Phone # [phone] Fax [phone]

## 2015-05-09 ENCOUNTER — Other Ambulatory Visit: Payer: Self-pay | Admitting: *Deleted

## 2015-05-09 ENCOUNTER — Telehealth: Payer: Self-pay | Admitting: *Deleted

## 2015-05-09 ENCOUNTER — Ambulatory Visit (HOSPITAL_BASED_OUTPATIENT_CLINIC_OR_DEPARTMENT_OTHER): Payer: 59

## 2015-05-09 VITALS — BP 131/87 | HR 88 | Temp 97.9°F

## 2015-05-09 DIAGNOSIS — Z5111 Encounter for antineoplastic chemotherapy: Secondary | ICD-10-CM | POA: Diagnosis not present

## 2015-05-09 DIAGNOSIS — C7951 Secondary malignant neoplasm of bone: Secondary | ICD-10-CM | POA: Diagnosis not present

## 2015-05-09 DIAGNOSIS — C50211 Malignant neoplasm of upper-inner quadrant of right female breast: Secondary | ICD-10-CM | POA: Diagnosis not present

## 2015-05-09 DIAGNOSIS — C771 Secondary and unspecified malignant neoplasm of intrathoracic lymph nodes: Secondary | ICD-10-CM

## 2015-05-09 MED ORDER — FULVESTRANT 250 MG/5ML IM SOLN
500.0000 mg | Freq: Once | INTRAMUSCULAR | Status: AC
  Administered 2015-05-09: 500 mg via INTRAMUSCULAR
  Filled 2015-05-09: qty 10

## 2015-05-09 MED ORDER — DENOSUMAB 120 MG/1.7ML ~~LOC~~ SOLN
120.0000 mg | Freq: Once | SUBCUTANEOUS | Status: AC
  Administered 2015-05-09: 120 mg via SUBCUTANEOUS
  Filled 2015-05-09: qty 1.7

## 2015-05-09 MED ORDER — FIRST-DUKES MOUTHWASH MT SUSP: OROMUCOSAL | Status: DC

## 2015-05-09 NOTE — Telephone Encounter (Signed)
This RN spoke with pt post injections due to continued thrush with Ibrance therapy.  Thrush developed approximately 10 days in to this cycle. [name] [name] used nystatin swish- gargle and spit with benefit. Symptoms were improving so she started to taper off the nystatin. Yesterday the thrush seemed to be returning which was also when she took her last dose of Ibrance.   Per RN assessment noted sporadic areas of thrush on back of throat and uvula.  [name] [name] states she feels " like my lymph glands are swollen and it is more difficult to swallow"  This RN noticed some fullness bilaterally in pt's neck but no palpation of nodules.  Per discussion and plan - Veleria will resume the nystatin to QID. She will monitor and call on Friday if symptoms are not improved. If improved pt will stay on the nystatin until she is seen next week by MD.  [name] [name] is concerned due to having to " stay on the nystatin all the time ".  This RN validated pt's concerns regarding side effects of medication.  [name] [name] concerns were discussed with MD who will follow up at MD visit next week for further discussion.

## 2015-05-10 ENCOUNTER — Encounter: Payer: Self-pay | Admitting: Physical Therapy

## 2015-05-10 ENCOUNTER — Ambulatory Visit: Payer: 59 | Admitting: Physical Therapy

## 2015-05-10 DIAGNOSIS — R1031 Right lower quadrant pain: Secondary | ICD-10-CM

## 2015-05-10 DIAGNOSIS — M25551 Pain in right hip: Secondary | ICD-10-CM

## 2015-05-10 DIAGNOSIS — M6289 Other specified disorders of muscle: Secondary | ICD-10-CM

## 2015-05-10 NOTE — Therapy (Signed)
[facility] [address], [place], [place], [address] Phone: [phone]   Fax:  [phone]  Physical Therapy Treatment  Patient Details  Name: [name] [name] MRN: [id number] Date of Birth: 1960-04-04 Referring Provider: Dr. Lurline Del  Encounter Date: 05/10/2015      PT End of Session - 05/10/15 1317    Visit Number 21   Date for PT Re-Evaluation 06/25/15   Authorization - Visit Number 6   Authorization - Number of Visits 20   PT Start Time 1230   PT Stop Time 1310   PT Time Calculation (min) 40 min   Activity Tolerance Patient tolerated treatment well   Behavior During Therapy [facility] for tasks assessed/performed      Past Medical History  Diagnosis Date  . Seizures ([place]) 2010    Isolated incident.  Marland Kitchen PONV (postoperative nausea and vomiting)   . Peripheral vascular disease ([place]) 02/2010    blood clot related to porta cath  . S/P radiation therapy 07/17/2014 through 08/02/2014     Left mediastinum, left seventh rib 3250 cGy in 13 sessions   . S/P radiation therapy 12/11/2014 through 12/22/2014     Left parietal calvarium 2400 cGy in 8 sessions   . Breast cancer ([place]) dx'd 2005/2011  . Bone metastases ([place]) dx'd 05/2014    Past Surgical History  Procedure Laterality Date  . Breast lumpectomy  2005  . Axillary lymph node dissection  Dec. 2011  . Portacath placement  12/11  . Removal portacath    . Mediastinotomy chamberlain mcneil Left 06/02/2013    Procedure: MEDIASTINOTOMY CHAMBERLAIN MCNEIL;  Surgeon: [name], MD;  Location: [place];  Service: Thoracic;  Laterality: Left;  LEFT ANTERIOR MEDIASTINOTOMY     There were no vitals filed for this visit.  Visit Diagnosis:  Groin pain, right  Muscle stiffness  Pain in joint involving  right pelvic region and thigh      Subjective Assessment - 05/10/15 1237    Subjective I have tightness and soreness located more from midgroin to anterior right hip compared to having it posteriorly.    How long can you walk comfortably? make patient limp   Patient Stated Goals walk 20 minutes with minimal limp   Currently in Pain? Yes   Pain Score 6    Pain Location Groin   Pain Orientation Right   Pain Descriptors / Indicators Constant   Pain Type Chronic pain   Pain Onset More than a month ago   Pain Frequency Constant   Aggravating Factors  walking   Pain Relieving Factors rest, soft tissue work   Multiple Pain Sites No                      Pelvic Floor Special Questions - 05/10/15 0001    Pelvic Floor Internal Exam Patient approves physical therapist to perform pelvic floor muscle assessment   Exam Type Vaginal   Strength good squeeze, good lift, able to hold agaisnt strong resistance           [facility] Adult PT Treatment/Exercise - 05/10/15 0001    Manual Therapy   Manual Therapy Soft tissue mobilization;Internal Pelvic Floor;Myofascial release   Soft tissue mobilization right hip adductor, right rectus femorus   Internal Pelvic Floor righ tobturator internist, left intoitus, left puborectalis                  PT Short Term Goals - 02/27/15 1011  PT SHORT TERM GOAL #1   Title pain with walking decreased >/= 25%   Time 3   Period Weeks   Status Achieved   PT SHORT TERM GOAL #2   Title pelvis stays in correct alignment for 3 weeks   Time 3   Period Weeks   Status Achieved           PT Long Term Goals - 05/08/15 1318    PT LONG TERM GOAL #1   Title indpendent with HEP   Time 6   Period Weeks   Status On-going   PT LONG TERM GOAL #2   Title pain with walking decreased >/= 80%   Time 6   Period Weeks   Status On-going   PT LONG TERM GOAL #3   Title go up and down steps with a step over step pattern   Time 6   Period Weeks    Status On-going           Long Term Clinic Goals - 05/04/15 1231    CC Long Term Goal  #1   Title Reports left upper back and left side pain is controlled at 4/10 or less with therapy at the current frequency.   Status On-going   CC Long Term Goal  #2   Title Pt. will report swelling is adequately managed to enable ADL function at a consistent level.   Status On-going            Plan - 05/10/15 1310    Clinical Impression Statement patient reports the pain is 60% better after treatment.  Patient reports no bump feeling she had prior to treatment. Patient only has 2 finger width where the hip adductor inserts and not up toward the ASIS.  Patient has less of a limp after therapy.    Pt will benefit from skilled therapeutic intervention in order to improve on the following deficits Increased edema;Increased muscle spasms;Pain   Rehab Potential Good   Clinical Impairments Affecting Rehab Potential active cancer   PT Frequency 2x / week   PT Duration 8 weeks   PT Next Visit Plan soft tissue work, stretches, pevic floor strength   PT Home Exercise Plan progress as needed   Consulted and Agree with Plan of Care Patient   PT Plan Continue manual lymph drainage, soft tissue work.        Problem List Patient Active Problem List   Diagnosis Date Noted  . 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 Zoster 04/04/2015  . Nausea with vomiting 11/18/2014  . Constipation 11/18/2014  . Left-sided thoracic back pain   . Bone metastases ([place]) 11/16/2014  . Back pain 11/15/2014  . Uncontrolled pain 11/14/2014  . 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 Chest wall pain 03/21/2014  . Abnormal LFTs (liver function tests) 09/12/2013  . Breast cancer of upper-inner quadrant of right female breast ([place]) 08/18/2013  . 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 [place], [place], [address] Phone: [phone]   Fax:  [phone]  Name: [name] [name] MRN: [id number] Date of Birth: 1960-11-05

## 2015-05-11 ENCOUNTER — Ambulatory Visit: Payer: 59 | Admitting: Physical Therapy

## 2015-05-11 DIAGNOSIS — I890 Lymphedema, not elsewhere classified: Secondary | ICD-10-CM

## 2015-05-11 DIAGNOSIS — M6289 Other specified disorders of muscle: Secondary | ICD-10-CM

## 2015-05-11 DIAGNOSIS — M25551 Pain in right hip: Secondary | ICD-10-CM

## 2015-05-11 DIAGNOSIS — R1031 Right lower quadrant pain: Secondary | ICD-10-CM | POA: Diagnosis not present

## 2015-05-11 NOTE — Therapy (Signed)
[place], [place], [address] Phone: [phone]   Fax:  [phone]  Physical Therapy Treatment  Patient Details  Name: [name] [name] MRN: [id number] Date of Birth: 07/23/1960 Referring Provider: Dr. Lurline Del  Encounter Date: 05/11/2015      PT End of Session - 05/11/15 1208    Visit Number 22   Number of Visits 24   Date for PT Re-Evaluation 06/25/15   PT Start Time 1025   PT Stop Time 1105   PT Time Calculation (min) 40 min   Activity Tolerance Patient tolerated treatment well   Behavior During Therapy [name]      Past Medical History  Diagnosis Date  . Seizures ([place]) 2010    Isolated incident.  Marland Kitchen PONV (postoperative nausea and vomiting)   . Peripheral vascular disease ([place]) 02/2010    blood clot related to porta cath  . S/P radiation therapy 07/17/2014 through 08/02/2014     Left mediastinum, left seventh rib 3250 cGy in 13 sessions   . S/P radiation therapy 12/11/2014 through 12/22/2014     Left parietal calvarium 2400 cGy in 8 sessions   . Breast cancer ([place]) dx'd 2005/2011  . Bone metastases ([place]) dx'd 05/2014    Past Surgical History  Procedure Laterality Date  . Breast lumpectomy  2005  . Axillary lymph node dissection  Dec. 2011  . Portacath placement  12/11  . Removal portacath    . Mediastinotomy chamberlain mcneil Left 06/02/2013    Procedure: MEDIASTINOTOMY CHAMBERLAIN MCNEIL;  Surgeon: [name], MD;  Location: [facility];  Service: Thoracic;  Laterality: Left;  LEFT ANTERIOR MEDIASTINOTOMY     There were no vitals filed for this visit.  Visit Diagnosis:  Lymphedema  Muscle stiffness  Pain in joint involving right pelvic region and thigh      Subjective Assessment -  05/11/15 1023    Subjective "I didn't sleep much last night so I'm not myself."  Exgeva shot gave me a horrible headache.  Did the Flexitouch two times this week.   Currently in Pain? Yes   Pain Score 5    Pain Location Breast   Pain Orientation Right;Lateral   Pain Descriptors / Indicators Tightness   Aggravating Factors  swelling   Pain Relieving Factors less swelling                         [facility] Adult PT Treatment/Exercise - 05/11/15 0001    Manual Therapy   Myofascial Release right axilla with focus at superior scar tightness   Manual Lymphatic Drainage (MLD) In left sidelying, posterior interaxillary anastomosis and right axillo-inguinal anastomosis; in supine, short neck, left axilla and anterior interaxillary anastomosis, right groin and axillo-inguinal anastomosis, area between right breast scars, directing toward pathways, and right upper arm.  In right sidelying, left periscapular area toward left groin.   Other Manual Therapy soft tissue work at right thigh in right sidelying with focus on posterolateral aspect.                  PT Short Term Goals - 02/27/15 1011    PT SHORT TERM GOAL #1   Title pain with walking decreased >/= 25%   Time 3   Period Weeks   Status Achieved   PT SHORT TERM GOAL #2   Title pelvis stays in correct alignment for 3 weeks   Time 3   Period  Weeks   Status Achieved           PT Long Term Goals - 05/08/15 1318    PT LONG TERM GOAL #1   Title indpendent with HEP   Time 6   Period Weeks   Status On-going   PT LONG TERM GOAL #2   Title pain with walking decreased >/= 80%   Time 6   Period Weeks   Status On-going   PT LONG TERM GOAL #3   Title go up and down steps with a step over step pattern   Time 6   Period Weeks   Status On-going           Long Term Clinic Goals - 05/11/15 1210    CC Long Term Goal  #1   Title Reports left upper back and left side pain is controlled at 4/10 or less with  therapy at the current frequency.   Status On-going   CC Long Term Goal  #2   Title Pt. will report swelling is adequately managed to enable ADL function at a consistent level.   Status On-going            Plan - 05/11/15 1208    Clinical Impression Statement Patient with visible fullness and induration at right lateral breast at start of session that is softer and smaller after treatment.  Also, patient walks with less limp right after session.   Pt will benefit from skilled therapeutic intervention in order to improve on the following deficits Increased edema;Increased muscle spasms;Pain   Rehab Potential Good   Clinical Impairments Affecting Rehab Potential active cancer   PT Frequency 2x / week   PT Duration 8 weeks   PT Treatment/Interventions Manual techniques;Manual lymph drainage   PT Next Visit Plan for lymphedema, manual lymph drainage and soft tissue work   Consulted and Agree with Plan of Care Patient        Problem List Patient Active Problem List   Diagnosis Date Noted  . 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 Zoster 04/04/2015  . Nausea with vomiting 11/18/2014  . Constipation 11/18/2014  . Left-sided thoracic back pain   . Bone metastases ([place]) 11/16/2014  . Back pain 11/15/2014  . Uncontrolled pain 11/14/2014  . 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 Chest wall pain 03/21/2014  . Abnormal LFTs (liver function tests) 09/12/2013  . Breast cancer of upper-inner quadrant of right female breast ([place]) 08/18/2013  . 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

## 2015-05-13 NOTE — Progress Notes (Signed)
  Radiation Oncology         [phone] ________________________________  Name: [name] [name] MRN: [id number]  Date: 04/09/2015   DOB: May 01, 1960  End of Treatment Note  Diagnosis:    ICD-9-CM ICD-10-CM    1. Bone metastases (HCC) 198.5 C79.51     Painful right inferior pubic ramus metastases from metastatic breast cancer (Stage IV)     Indication for treatment:  Palliation of pain       Radiation treatment dates:  03/29/2015-04/09/2015  Site/dose:   The metastasis was treated to 24 Gy in 4 fractions of 6 Gy.  Beams/energy:   The patient was treated with stereotactic body radiotherapy using 2 volumetric arc therapy beams and daily image guidance.  Narrative: The patient tolerated radiation treatment relatively well.   Her pain level improved modestly during treatment.   Plan: The patient has completed radiation treatment. The patient will return to radiation oncology clinic for routine followup in one month. I advised her to call or return sooner if she has any questions or concerns related to her recovery or treatment. ________________________________  Sheral Apley. Tammi Klippel, M.D.  This document serves as a record of services personally performed by Tyler Pita, MD. It was created on his behalf by Arlyce Harman, a trained medical scribe. The creation of this record is based on the scribe's personal observations and the provider's statements to them. This document has been checked and approved by the attending provider.

## 2015-05-14 ENCOUNTER — Ambulatory Visit (HOSPITAL_BASED_OUTPATIENT_CLINIC_OR_DEPARTMENT_OTHER): Payer: 59 | Admitting: Oncology

## 2015-05-14 ENCOUNTER — Ambulatory Visit: Payer: 59 | Admitting: Physical Therapy

## 2015-05-14 ENCOUNTER — Other Ambulatory Visit (HOSPITAL_BASED_OUTPATIENT_CLINIC_OR_DEPARTMENT_OTHER): Payer: 59

## 2015-05-14 ENCOUNTER — Encounter: Payer: Self-pay | Admitting: Physical Therapy

## 2015-05-14 DIAGNOSIS — M6289 Other specified disorders of muscle: Secondary | ICD-10-CM

## 2015-05-14 DIAGNOSIS — R1031 Right lower quadrant pain: Secondary | ICD-10-CM

## 2015-05-14 DIAGNOSIS — C50211 Malignant neoplasm of upper-inner quadrant of right female breast: Secondary | ICD-10-CM | POA: Diagnosis not present

## 2015-05-14 DIAGNOSIS — J90 Pleural effusion, not elsewhere classified: Secondary | ICD-10-CM

## 2015-05-14 DIAGNOSIS — M25551 Pain in right hip: Secondary | ICD-10-CM

## 2015-05-14 DIAGNOSIS — C771 Secondary and unspecified malignant neoplasm of intrathoracic lymph nodes: Secondary | ICD-10-CM

## 2015-05-14 DIAGNOSIS — I890 Lymphedema, not elsewhere classified: Secondary | ICD-10-CM

## 2015-05-14 LAB — CBC WITH DIFFERENTIAL/PLATELET
BASO%: 0.7 % (ref 0.0–2.0)
BASOS ABS: 0 10*3/uL (ref 0.0–0.1)
EOS ABS: 0.1 10*3/uL (ref 0.0–0.5)
EOS%: 1.5 % (ref 0.0–7.0)
HCT: 36.5 % (ref 34.8–46.6)
HGB: 12.7 g/dL (ref 11.6–15.9)
LYMPH%: 18.6 % (ref 14.0–49.7)
MCH: 32.3 pg (ref 25.1–34.0)
MCHC: 34.8 g/dL (ref 31.5–36.0)
MCV: 92.9 fL (ref 79.5–101.0)
MONO#: 0.4 10*3/uL (ref 0.1–0.9)
MONO%: 10.4 % (ref 0.0–14.0)
NEUT#: 2.8 10*3/uL (ref 1.5–6.5)
NEUT%: 68.8 % (ref 38.4–76.8)
Platelets: 290 10*3/uL (ref 145–400)
RBC: 3.93 10*6/uL (ref 3.70–5.45)
RDW: 14.4 % (ref 11.2–14.5)
WBC: 4 10*3/uL (ref 3.9–10.3)
lymph#: 0.8 10*3/uL — ABNORMAL LOW (ref 0.9–3.3)
nRBC: 0 % (ref 0–0)

## 2015-05-14 NOTE — Therapy (Signed)
[facility] [address] [place], [place], [address] Phone: [phone]   Fax:  [phone]  Physical Therapy Treatment  Patient Details  Name: [name] [name] MRN: [id number] Date of Birth: [birth year] Referring Provider: Dr. Lurline Del  Encounter Date: 05/14/2015      [name] [name] End of Session - 05/14/15 1405    Visit Number 24  plevis   Date for [name] [name] Re-Evaluation 06/25/15   Authorization - Visit Number 24   Authorization - Number of Visits 24   [name] [name] Start Time 1400   [name] [name] Stop Time 1440   [name] [name] Time Calculation (min) 40 min   Activity Tolerance Patient tolerated treatment well   Behavior During Therapy [facility] for tasks assessed/performed      Past Medical History  Diagnosis Date  . Seizures ([place]) 2010    Isolated incident.  Marland Kitchen PONV (postoperative nausea and vomiting)   . Peripheral vascular disease ([place]) 02/2010    blood clot related to porta cath  . S/P radiation therapy 07/17/2014 through 08/02/2014     Left mediastinum, left seventh rib 3250 cGy in 13 sessions   . S/P radiation therapy 12/11/2014 through 12/22/2014     Left parietal calvarium 2400 cGy in 8 sessions   . Breast cancer ([place]) dx'd 2005/2011  . Bone metastases ([place]) dx'd 05/2014    Past Surgical History  Procedure Laterality Date  . Breast lumpectomy  2005  . Axillary lymph node dissection  Dec. 2011  . Portacath placement  12/11  . Removal portacath    . Mediastinotomy chamberlain mcneil Left 06/02/2013    Procedure: MEDIASTINOTOMY CHAMBERLAIN MCNEIL;  Surgeon: [name], MD;  Location: [facility];  Service: Thoracic;  Laterality: Left;  LEFT ANTERIOR MEDIASTINOTOMY     There were no vitals filed for this visit.  Visit Diagnosis:  Muscle stiffness  Groin pain, right  Pain in joint  involving right pelvic region and thigh      Subjective Assessment - 05/14/15 1405    Subjective walking pain is 6-7.  At rest I can just feel it.  the pain is staying more centralized in the right groin.  The anterior groin pain from last week has improved by 40%.     How long can you walk comfortably? make patient limp   Diagnostic tests x rays of back show no fractures;    Patient Stated Goals walk 20 minutes with minimal limp   Currently in Pain? Yes   Pain Score 3   walking 6-7/10   Pain Location Groin   Pain Orientation Right   Pain Descriptors / Indicators Constant   Pain Type Chronic pain   Pain Onset More than a month ago   Pain Frequency Constant   Aggravating Factors  walking   Pain Relieving Factors rest   Multiple Pain Sites No                      Pelvic Floor Special Questions - 05/14/15 0001    Pelvic Floor Internal Exam Patient approves physical therapist to perform pelvic floor muscle assessment   Exam Type Vaginal   Palpation soft tissue work internally in the righ tvaginal wall and extermially to right hip adductor, and medical right quadriceps           [facility] Adult [name] [name] Treatment/Exercise - 05/14/15 0001    Manual Therapy   Myofascial Release right axilla with focus at superior scar tightness   Manual Lymphatic  Drainage (MLD) In left sidelying, posterior interaxillary anastomosis and right axillo-inguinal anastomosis; in supine, short neck, left axilla and anterior interaxillary anastomosis, right groin and axillo-inguinal anastomosis, area between right breast scars, directing toward pathways, and right upper arm.  In right sidelying, left periscapular area toward left groin.   Other Manual Therapy soft tissue work in right sidelying to right superior thigh, medial and posterior aspects                [name] [name] Education - 05/14/15 1436    Education provided Yes   Education Details lubricants and moisturizers   Person(s) Educated Patient    Methods Explanation;Handout   Comprehension Verbalized understanding          [name] [name] Short Term Goals - 02/27/15 1011    [name] [name] SHORT TERM GOAL #1   Title pain with walking decreased >/= 25%   Time 3   Period Weeks   Status Achieved   [name] [name] SHORT TERM GOAL #2   Title pelvis stays in correct alignment for 3 weeks   Time 3   Period Weeks   Status Achieved           [name] [name] Long Term Goals - 05/14/15 1442    [name] [name] LONG TERM GOAL #1   Title indpendent with HEP   Time 6   Period Weeks   Status On-going   [name] [name] LONG TERM GOAL #2   Title pain with walking decreased >/= 80%   Time 6   Period Weeks   Status On-going   [name] [name] LONG TERM GOAL #3   Title go up and down steps with a step over step pattern   Time 6   Period Weeks   Status On-going           Long Term Clinic Goals - 05/11/15 1210    CC Long Term Goal  #1   Title Reports left upper back and left side pain is controlled at 4/10 or less with therapy at the current frequency.   Status On-going   CC Long Term Goal  #2   Title [name] [name]. will report swelling is adequately managed to enable ADL function at a consistent level.   Status On-going            Plan - 05/14/15 1439    Clinical Impression Statement Patient is not limping when walking out of the therapy.  Patient has vaginal dryness, therapist discussed with patient on how to improve using moisturizers and lubricants.  Patien thas a specific  area of tightness in the right wall of vagina  and is concentratted. Patient will benefit form skilled therapy to reduce pain.    [name] [name] will benefit from skilled therapeutic intervention in order to improve on the following deficits Pain;Abnormal gait;Increased muscle spasms;Increased fascial restricitons   Rehab Potential Good   Clinical Impairments Affecting Rehab Potential active cancer   [name] [name] Frequency 2x / week   [name] [name] Duration 8 weeks   [name] [name] Treatment/Interventions Manual techniques;Patient/family education;Therapeutic exercise   [name] [name] Next  Visit Plan soft tissue work   [name] [name] Home Exercise Plan progress as needed   Consulted and Agree with Plan of Care Patient   [name] [name] Plan Continue manual lymph drainage, soft tissue work.        Problem List Patient Active Problem List   Diagnosis Date Noted  . 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 Zoster 04/04/2015  . Nausea with vomiting 11/18/2014  . Constipation 11/18/2014  . Left-sided thoracic back pain   .  Bone metastases ([place]) 11/16/2014  . Back pain 11/15/2014  . Uncontrolled pain 11/14/2014  . 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 Chest wall pain 03/21/2014  . Abnormal LFTs (liver function tests) 09/12/2013  . Breast cancer of upper-inner quadrant of right female breast ([place]) 08/18/2013  . 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 [place], [place], [address] Phone: [phone]   Fax:  [phone]  Name: [name] [name] MRN: [id number] Date of Birth: Aug 15, 1960

## 2015-05-14 NOTE — Progress Notes (Signed)
[place]  [phone] Fax:([phone]     ID: [name] OB: Feb 27, 1961  MR#: [id number]  WER#:[id number]  PCP: Pcp Not In System GYN:  [name] SU:  OTHER MD: [name], [name], Jae Dire, [name], [name], [name]  CHIEF COMPLAINT: Stage IV breast cancer  CURRENT TREATMENT: Fulvestrant, (denosumab,) (palbociclib)  BREAST CANCER HISTORY: From doctor [name]'s intake note 03/20/2004:  "The patient is a very pleasant 55 year old female, without significant past medical history.  Her family history is significant for a sister who at age 61 was diagnosed with invasive ductal carcinoma.  She is a breast cancer survivor at age 52 now.  The patient states that she has never really had a screening mammogram until October 2005, when she felt that it was time for her to start having mammograms done on a yearly basis.  Therefore, on 01/26/04, she underwent a screening mammogram and an abnormality was detected in the upper outer right breast.  She, therefore, underwent spot compression views of both the right and the left breast.  The left breast revealed a well-defined mass in the upper outer left quadrant, present at the 2 o'clock position, measuring 1.8 cm, 6 cm from the nipple.  This, by ultrasound, was felt to be a simple cyst measuring 1.8 cm.  On the right breast, a spiculated mass was noted in the upper outer right quadrant.  The ultrasound revealed a shadowing irregular solid mass at the 10:30 position, 9 cm from the nipple, measuring 1.2 cm in greatest dimension, correlating with the spiculated mass seen on the mammogram.  The right axilla was negative ultrasonically.  Because of this, the patient underwent a needle biopsy of the right breast and the biopsy was positive invasive mammary carcinoma that showed features consistent with a high-grade invasive ductal carcinoma associated with desmoplastic stroma.   No in situ component was seen and no definite lymphovascular invasion was identified.  On the core biopsy, the tumor measured about 0.8 cm.  Because of this, she was seen by Dr. [name] and the patient was taken to the [facility] on March 15, 2004.  She underwent a right breast lumpectomy with sentinel node biopsy.  The final pathology revealed an invasive ductal carcinoma, measuring 1.7 cm, grade 2 of 3.  Margins were free of tumor.  Atypical lobular hyperplasia was noted.  One sentinel node was removed which was negative for metastatic disease.  The tumor was staged at T1c, N0 MX.  It was estrogen receptor positive, progesterone receptor positive.  HER-2/neu was 2+.  FISH was negative.  All margins were free of tumor.  She is now seen in Medical Oncology for further evaluation and management of this newly diagnosed T1c, node negative, stage I, invasive ductal carcinoma of the right breast."  Her subsequent history is as detailed below  INTERVAL HISTORY: Samie presented today for an unscheduled visit accompanied by her husband [name]. She was changing light on at 3 and scratched the radial aspect of her right wrist. This became swollen and red and she was alarmed that this might be an insect bite or significant infection. Accordingly she presented for further evaluation.   REVIEW OF SYSTEMS:  Dillyn tells me the swelling and redness is already improving and there has been no further spread of the redness. She has had no fever. There has been no left upper extremity swelling. A detailed review of systems was otherwise stable  PAST MEDICAL HISTORY: Past  Medical History  Diagnosis Date  . Seizures ([place]) 2010    Isolated incident.  Marland Kitchen PONV (postoperative nausea and vomiting)   . Peripheral vascular disease ([place]) 02/2010    blood clot related to porta cath  . S/P radiation therapy 07/17/2014 through 08/02/2014     Left mediastinum, left seventh rib  3250 cGy in 13 sessions   . S/P radiation therapy 12/11/2014 through 12/22/2014     Left parietal calvarium 2400 cGy in 8 sessions   . Breast cancer ([place]) dx'd 2005/2011  . Bone metastases ([place]) dx'd 05/2014    PAST SURGICAL HISTORY: Past Surgical History  Procedure Laterality Date  . Breast lumpectomy  2005  . Axillary lymph node dissection  Dec. 2011  . Portacath placement  12/11  . Removal portacath    . Mediastinotomy chamberlain mcneil Left 06/02/2013    Procedure: MEDIASTINOTOMY CHAMBERLAIN MCNEIL;  Surgeon: [name], MD;  Location: [facility] OR;  Service: Thoracic;  Laterality: Left;  LEFT ANTERIOR MEDIASTINOTOMY     FAMILY HISTORY Family History  Problem Relation Age of Onset  . COPD Mother   . Breast cancer Sister 36   The patient's father is living, 39 years old as of may 2015. He lives in [place]. The patient's mother died from complications of COPD at the age of 36. These has 2 brothers, one sister. Her sister developed breast cancer at the age of 65. She is doing well. The patient herself underwent genetic testing at [facility] in 2011 and was found to be BRCA negative  GYNECOLOGIC HISTORY:  Menarche age 53, she is GX P0. She stopped having periods with her initial chemotherapy in 2006.  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 Her husband [name] is a Medical illustrator in [place]. He has a child from a prior marriage. At home they have 2 rescue dogs, Hobo and Independence. The patient is religious but not a church attender    ADVANCED DIRECTIVES: In place; at the 08/04/2014 visit in particular the patient was very clear, with her husband present, that she would not want any kind of feeding tubes or "other tubes" if her condition deteriorated.   HEALTH MAINTENANCE: Social History  Substance Use Topics   . Smoking status: Never Smoker   . Smokeless tobacco: Never Used  . Alcohol Use: No     Colonoscopy:  PAP:  Bone density: March 2015; mild osteopenia  Lipid panel:  Allergies  Allergen Reactions  . 2nd Skin Quick Heal Other (See Comments)    Other Reaction: Skin peels  . Decadron [Dexamethasone] Other (See Comments)    Patient does not tolerate steroids.   . Dilaudid [Hydromorphone] Nausea And Vomiting  . Enoxaparin Other (See Comments)    unknown  . Fluconazole Swelling    Liver toxicity  . Hydromorphone Hcl Nausea And Vomiting  . Morphine And Related Nausea And Vomiting  . Protonix [Pantoprazole Sodium] Other (See Comments)    Patient reports it caused thrush.  . Tegaderm Ag Mesh [Silver]     Current Outpatient Prescriptions  Medication Sig Dispense Refill  . acetaminophen (TYLENOL) 160 mg/5 mL SOLN Reported on 03/27/2015    . ALPRAZolam (XANAX) 0.5 MG tablet Take 1 tablet (0.5 mg total) by mouth 2 (two) times daily as needed for anxiety. 30 tablet 0  . B Complex-C (B-COMPLEX WITH VITAMIN C) tablet Take 1 tablet by mouth daily. Reported on 03/27/2015    .  cholecalciferol 2000 UNITS tablet Take 1 tablet (2,000 Units total) by mouth daily.    . Diphenhyd-Hydrocort-Nystatin (FIRST-DUKES MOUTHWASH) SUSP 5-10 ml qid SWISH AND SPIT 962 mL 3  . folic acid (FOLVITE) 1 MG tablet Take 1 tablet (1 mg total) by mouth daily.    Marland Kitchen ibuprofen (ADVIL,MOTRIN) 200 MG tablet Take 400 mg by mouth every 4 (four) hours as needed for moderate pain. Reported on 03/27/2015    . magnesium chloride (SLOW-MAG) 64 MG TBEC SR tablet Take 60 mg by mouth daily.    . Melatonin 3 MG TABS Take 3 mg by mouth at bedtime.    . naproxen sodium (ANAPROX) 220 MG tablet Take 220 mg by mouth 2 (two) times daily with a meal.    . nystatin (MYCOSTATIN) 100000 UNIT/ML suspension Take 5 mLs (500,000 Units total) by mouth 4 (four) times daily. 240 mL 3  . palbociclib (IBRANCE) 75 MG capsule Take 1 capsule (75 mg total)  by mouth daily with breakfast. Take whole with food. 21 capsule 6  . saccharomyces boulardii (FLORASTOR) 250 MG capsule Take 250 mg by mouth daily.      No current facility-administered medications for this visit.    OBJECTIVE: Middle-aged white woman who appears stated age There were no vitals filed for this visit.   There is no weight on file to calculate BMI.   There were no vitals filed for this visit.    Patient refused vitals today 05/14/2015     ECOG FS:1 - Symptomatic but completely ambulatory  The area in question in the radial aspect of her wrist, proximal to the base of the thumb consists of a minor scratch and 1 area less than a millimeter where the skin may have been broken. There is no swelling at present. The erythema is minimal. There is no nothing suggesting spread up the arm.   LAB RESULTS:   CMP     Component Value Date/Time   NA 137 05/08/2015 1039   NA 140 12/14/2014 0800   K 3.8 05/08/2015 1039   K 3.6 12/14/2014 0800   CL 103 12/14/2014 0800   CL 105 05/06/2012 1333   CO2 26 05/08/2015 1039   CO2 28 12/14/2014 0800   GLUCOSE 88 05/08/2015 1039   GLUCOSE 100* 12/14/2014 0800   GLUCOSE 124* 05/06/2012 1333   BUN 8.9 05/08/2015 1039   BUN 6 12/14/2014 0800   CREATININE 0.8 05/08/2015 1039   CREATININE 0.64 12/14/2014 0800   CALCIUM 9.5 05/08/2015 1039   CALCIUM 9.6 12/14/2014 0800   PROT 7.2 05/08/2015 1039   PROT 5.6* 11/20/2014 0433   ALBUMIN 3.8 05/08/2015 1039   ALBUMIN 2.6* 11/20/2014 0433   AST 26 05/08/2015 1039   AST 28 11/20/2014 0433   ALT 35 05/08/2015 1039   ALT 62* 11/20/2014 0433   ALKPHOS 94 05/08/2015 1039   ALKPHOS 105 11/20/2014 0433   BILITOT 0.37 05/08/2015 1039   BILITOT 0.7 11/20/2014 0433   GFRNONAA >60 12/14/2014 0800   GFRAA >60 12/14/2014 0800    No results found for: SPEP  Lab Results  Component Value Date   WBC 4.0 05/14/2015   NEUTROABS 2.8 05/14/2015   HGB 12.7 05/14/2015   HCT 36.5 05/14/2015   MCV 92.9  05/14/2015   PLT 290 05/14/2015      Chemistry      Component Value Date/Time   NA 137 05/08/2015 1039   NA 140 12/14/2014 0800   K 3.8 05/08/2015 1039  K 3.6 12/14/2014 0800   CL 103 12/14/2014 0800   CL 105 05/06/2012 1333   CO2 26 05/08/2015 1039   CO2 28 12/14/2014 0800   BUN 8.9 05/08/2015 1039   BUN 6 12/14/2014 0800   CREATININE 0.8 05/08/2015 1039   CREATININE 0.64 12/14/2014 0800      Component Value Date/Time   CALCIUM 9.5 05/08/2015 1039   CALCIUM 9.6 12/14/2014 0800   ALKPHOS 94 05/08/2015 1039   ALKPHOS 105 11/20/2014 0433   AST 26 05/08/2015 1039   AST 28 11/20/2014 0433   ALT 35 05/08/2015 1039   ALT 62* 11/20/2014 0433   BILITOT 0.37 05/08/2015 1039   BILITOT 0.7 11/20/2014 0433       Lab Results  Component Value Date   LABCA2 25 11/02/2007    No components found for: HLKTG256  No results for input(s): INR in the last 168 hours.  Urinalysis    Component Value Date/Time   COLORURINE YELLOW 11/17/2014 0143   APPEARANCEUR CLOUDY* 11/17/2014 0143   LABSPEC 1.010 11/17/2014 0143   LABSPEC 1.005 09/12/2013 1542   PHURINE 6.5 11/17/2014 0143   PHURINE 6.0 09/12/2013 1542   GLUCOSEU NEGATIVE 11/17/2014 0143   GLUCOSEU Negative 09/12/2013 1542   HGBUR NEGATIVE 11/17/2014 0143   HGBUR Negative 09/12/2013 1542   BILIRUBINUR NEGATIVE 11/17/2014 0143   BILIRUBINUR Negative 09/12/2013 1542   KETONESUR NEGATIVE 11/17/2014 0143   KETONESUR Negative 09/12/2013 1542   PROTEINUR NEGATIVE 11/17/2014 0143   PROTEINUR Negative 09/12/2013 1542   UROBILINOGEN 0.2 11/17/2014 0143   UROBILINOGEN 0.2 09/12/2013 1542   NITRITE NEGATIVE 11/17/2014 0143   NITRITE Negative 09/12/2013 1542   LEUKOCYTESUR NEGATIVE 11/17/2014 0143   LEUKOCYTESUR Negative 09/12/2013 1542    STUDIES: Dg Chest 2 View  04/20/2015  CLINICAL DATA:  Evaluate left effusion. History of left breast cancer. Left posterior chest pain. EXAM: CHEST  2 VIEW COMPARISON:  01/08/2015 FINDINGS:  There is small loculated left pleural effusion and/or pleural thickening, slightly decreased since prior study. Linear scarring or atelectasis in the left lung base. Left upper lobe consolidation is also unchanged. Right lung is clear. No acute bony abnormality. Heart is normal size. IMPRESSION: Slight decreased size of the loculated left pleural effusion and/or pleural thickening. Stable left upper lobe consolidation and left basilar linear scarring or atelectasis. Electronically Signed   By: Rolm Baptise M.D.   On: 04/20/2015 13:27   Dg Thoracic Spine 2 View  04/20/2015  CLINICAL DATA:  Metastatic breast cancer. EXAM: THORACIC SPINE 2 VIEWS COMPARISON:  CT scan of March 13, 2015. FINDINGS: Moderate dextroscoliosis of thoracic spine is noted. No fracture or spondylolisthesis is noted. No definite evidence of lytic destruction is seen in the vertebral bodies, and sclerotic lesions seen at T1 on prior CT scan is not well visualized on this study. IMPRESSION: No fracture or spondylolisthesis is noted in the thoracic spine on this study. Electronically Signed   By: Marijo Conception, M.D.   On: 04/20/2015 13:01   Dg Lumbar Spine 2-3 Views  04/20/2015  CLINICAL DATA:  Possible metastatic lesions. EXAM: LUMBAR SPINE - 2-3 VIEW COMPARISON:  CT scan of March 22, 2015. FINDINGS: Moderate levoscoliosis of lower thoracic and lumbar spine is noted. No fracture or spondylolisthesis is noted. Disc spaces appear to be well maintained. No lytic or destructive bone lesion is seen on these images. IMPRESSION: Moderate levoscoliosis of lower thoracic and lumbar spine. No acute abnormality is seen in the lumbar spine. Electronically Signed  By: Marijo Conception, M.D.   On: 04/20/2015 12:58    ASSESSMENT: 56 y.o. [place] woman with stage IV breast cancer, history as follows  (1)  S/p Right lumpectomy and sentinel lymph node sampling 03/15/2004 for a pT1c pN0. Stage IA invasive ductal carcinoma, grade 2, estrogen  receptor 95% positive, progesterone receptor 65% positive, HER-2 not amplified; additional surgery 04/25/2004 for seroma or clearance showed no residual tumor  (2) adjuvant chemotherapy with cyclophosphamide and doxorubicin every 21 days x4 completed 07/19/2004  (3) adjuvant radiation given under Dr. Donella Stade in [place] completed July 2006  (4) the patient opted against adjuvant antiestrogen therapy  (5) genetics testing showed no BRCA mutations  (6) biopsy of a palpable right axillary mass 10/24/2009 showed invasive ductal carcinoma, grade 3, estrogen receptor 100% positive, progesterone receptor 2% positive (alert score 5) HER-2 negative; no evidence of systemic disease on PET scanning  (7) completed 3 of 4 planned cycles of docetaxel and cyclophosphamide September 2011, fourth cycle omitted because of marked elevations in liver function tests  (8) an right axillary lymph node dissection 03/06/2010 showed 3/8 lymph nodes removed to be involved by tumor, with extracapsular extension.  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, TTS-1 negative), estrogen receptor 80% positive, progesterone receptor 2% positive, HER-2 not amplified  (12) letrozole started March 2015-- tolerated with significant side effects, discontinued at the end of May 2015  (13) PET scan 08/16/2013 shows extensive left pleural metastatic disease and a large left pleural effusion that shifts cardiac and mediastinal structures to the right; adenopathy (celiac trunk, periadrenal, periaortic); and a left medial clavicular  lesion; Status post left thoracentesis 08/16/2013 positive for adenocarcinoma, estrogen receptor positive, progesterone receptor negative.  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; everolimus was suggested as an available targeted agent  (18) exemestane started 03/31/2014, discontinued 10/31/2014 with evidence of progression  (a) everolimus added 04/03/2014 but not tolerated (cytopenias, elevated LFTs) even at minimal doses; stopped 04/17/2014  (19) fulvestrant started 12/20/2014  (a) palbociclib added at very low dose 04/03/2015 (starting dose 75 mg weekly)    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 treated with Lovenox for 5-6 months  (c) right upper extremity lymphedema--receiving physical therapy  (d) hepatic steatosis with chronically elevated LFTs as well as unusual hepatic sensitivity to chemotherapy  (e) osteopenia with the lowest T score -1.6 on bone density scan 06/20/2013  (f) radiation oncology (Dr Valere Dross) has reviewed prior radiation records in case there is further mediastinal involvement with dysphagia etc in which case palliative XRT could be considered  (a) radiation to left mediastinum/ left 7th rib 3250 cGy in 13 sessions04/18/2016 through 08/02/2014  (b) radiation to  T11 area: 22 Gy in 7 sessions, last dose 11/27/2014  (c) radiation left parietal scalp region to be completed  12/22/2014  (d) radiation to sacral area completed 04/09/2015  (g) chest wall and perineal pain--improved post radiation treatments  (h) zoster diagnosed 04/04/2015-- on valacyclovir   PLAN: I think [place] is erythema and swelling, which is already improving, is due to a local reaction to something in the [place], similar to a mild case of poison ivy, but does not suggest an insect bite or infection.  She had multiple questions in addition to this today but she really has an appointment with me tomorrow for 1 hour and we decided to postpone a full discussion until then.  She was reassured after today's visit.   Chauncey Cruel, MD   05/14/2015 4:31 PM

## 2015-05-14 NOTE — Therapy (Signed)
[place], [place], [address] Phone: [phone]   Fax:  [phone]  Physical Therapy Treatment  Patient Details  Name: [name] [name] MRN: [id number] Date of Birth: 03-21-1961 Referring Provider: Dr. Lurline Del  Encounter Date: 05/14/2015      PT End of Session - 05/14/15 1110    Visit Number 23   Number of Visits 24   Date for PT Re-Evaluation 06/25/15   PT Start Time 1019   PT Stop Time 1105   PT Time Calculation (min) 46 min   Activity Tolerance Patient tolerated treatment well   Behavior During Therapy [facility] for tasks assessed/performed      Past Medical History  Diagnosis Date  . Seizures ([place]) 2010    Isolated incident.  Marland Kitchen PONV (postoperative nausea and vomiting)   . Peripheral vascular disease ([place]) 02/2010    blood clot related to porta cath  . S/P radiation therapy 07/17/2014 through 08/02/2014     Left mediastinum, left seventh rib 3250 cGy in 13 sessions   . S/P radiation therapy 12/11/2014 through 12/22/2014     Left parietal calvarium 2400 cGy in 8 sessions   . Breast cancer ([place]) dx'd 2005/2011  . Bone metastases ([place]) dx'd 05/2014    Past Surgical History  Procedure Laterality Date  . Breast lumpectomy  2005  . Axillary lymph node dissection  Dec. 2011  . Portacath placement  12/11  . Removal portacath    . Mediastinotomy chamberlain mcneil Left 06/02/2013    Procedure: MEDIASTINOTOMY CHAMBERLAIN MCNEIL;  Surgeon: [name], MD;  Location: [facility];  Service: Thoracic;  Laterality: Left;  LEFT ANTERIOR MEDIASTINOTOMY     There were no vitals filed for this visit.  Visit Diagnosis:  Lymphedema  Muscle stiffness  Pain in joint involving right pelvic region and thigh      Subjective Assessment -  05/14/15 1018    Subjective Pain inside at pubic ramus area has gotten worse over the weekend.  Did some walking in a store, but not a lot, and that seemed to aggravate.  Will ask Dr. Jana Hakim and Dr. Tammi Klippel to [facility] the PET. Tried the Flexitouch yesterday, and wonders if that pushed fluid to right groin area.  It may help the right lateral breast  some.     Currently in Pain? Yes   Pain Score 5    Pain Location Breast   Pain Orientation Right;Lateral   Pain Score 4  up to 7 with walking   Pain Location Groin   Pain Orientation Right   Aggravating Factors  walking                         [facility] Adult PT Treatment/Exercise - 05/14/15 0001    Manual Therapy   Myofascial Release right axilla with focus at superior scar tightness   Manual Lymphatic Drainage (MLD) In left sidelying, posterior interaxillary anastomosis and right axillo-inguinal anastomosis; in supine, short neck, left axilla and anterior interaxillary anastomosis, right groin and axillo-inguinal anastomosis, area between right breast scars, directing toward pathways, and right upper arm.  In right sidelying, left periscapular area toward left groin.   Other Manual Therapy soft tissue work in right sidelying to right superior thigh, medial and posterior aspects                  PT Short Term Goals - 02/27/15 1011    PT SHORT TERM GOAL #  1   Title pain with walking decreased >/= 25%   Time 3   Period Weeks   Status Achieved   PT SHORT TERM GOAL #2   Title pelvis stays in correct alignment for 3 weeks   Time 3   Period Weeks   Status Achieved           PT Long Term Goals - 05/08/15 1318    PT LONG TERM GOAL #1   Title indpendent with HEP   Time 6   Period Weeks   Status On-going   PT LONG TERM GOAL #2   Title pain with walking decreased >/= 80%   Time 6   Period Weeks   Status On-going   PT LONG TERM GOAL #3   Title go up and down steps with a step over step pattern   Time 6    Period Weeks   Status On-going           Long Term Clinic Goals - 05/11/15 1210    CC Long Term Goal  #1   Title Reports left upper back and left side pain is controlled at 4/10 or less with therapy at the current frequency.   Status On-going   CC Long Term Goal  #2   Title Pt. will report swelling is adequately managed to enable ADL function at a consistent level.   Status On-going            Plan - 05/14/15 1111    Clinical Impression Statement Patient with fullness but less induration at right lateral breast today at start of session; pain at upper thigh bothering her but helped somewhat by soft tissue work.   Pt will benefit from skilled therapeutic intervention in order to improve on the following deficits Increased edema;Increased muscle spasms;Pain   Rehab Potential Good   Clinical Impairments Affecting Rehab Potential active cancer   PT Frequency 2x / week   PT Duration 12 weeks   PT Treatment/Interventions Manual techniques;Manual lymph drainage   PT Next Visit Plan for lymphedema, manual lymph drainage and soft tissue work   Consulted and Agree with Plan of Care Patient        Problem List Patient Active Problem List   Diagnosis Date Noted  . 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 Zoster 04/04/2015  . Nausea with vomiting 11/18/2014  . Constipation 11/18/2014  . Left-sided thoracic back pain   . Bone metastases ([place]) 11/16/2014  . Back pain 11/15/2014  . Uncontrolled pain 11/14/2014  . 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 Chest wall pain 03/21/2014  . Abnormal LFTs (liver function tests) 09/12/2013  . Breast cancer of upper-inner quadrant of right female breast ([place]) 08/18/2013  . 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, [place], [address] Phone: [phone]   Fax:   [phone]  Name: [name] [name] MRN: [id number] Date of Birth: 10-12-1960    [name], PT 05/14/2015 11:13 AM

## 2015-05-14 NOTE — Patient Instructions (Signed)
Moisturizers . They are used in the vagina to hydrate the mucous membrane that make up the vaginal canal. . Designed to keep a more normal acid balance (ph) . Once placed in the vagina, it will last between two to three days.  . Use 2-3 times per week at bedtime and last longer than 60 min. . Ingredients to avoid is glycerin and fragrance, can increase chance of infection . Should not be used just before sex due to causing irritation . Most are gels administered either in a tampon-shaped applicator or as a vaginal suppository. They are non-hormonal.   Types of Moisturizers . Replens . Samul Dada . Vitamin E vaginal suppositories . Moist Again . Feminease . Coconut oil- can break down condoms . Hyalo  Things to avoid in the vaginal area . Do not use things to irritate the vulvar area . No lotions . No soaps; can use Aveeno or Calendula cleanser if needed. Must be gentle . No deodorants . No douches . Good to sleep without underwear to let the vaginal area to air out . No scrubbing: spread the lips to let warm water rinse over labias and pat dry  Lubrication . Used for intercourse to reduce friction . Avoid ones that have glycerin, warming gels, tingling gels, icing or cooling gel, scented . May need to be reapplied once or several times during sexual activity . Can be applied to both partners genitals prior to vaginal penetration to minimize friction or irritation . Prevent irritation and mucosal tears that cause post coital pain and increased the risk of vaginal and urinary tract infections . Oil-based lubricants cannot be used with condoms due to breaking them down.  Least likely to irritate vaginal tissue.  . Plant based-lubes are safe . Silicone-based lubrication are thicker and last long and used for post-menopausal women Types of Lubricants . Good Clean Love (water based)-[employer], [employer], [employer], [employer] . Slippery Stuff . Sylk . Blossom Organics . Samul Dada . Coconut  oil . Aloe Vera . Sliquid  Things to avoid in the vaginal area . Do not use things to irritate the vulvar area . No lotions . No soaps; can use Aveeno or Calendula cleanser if needed. Must be gentle . No deodorants . No douches . Good to sleep without underwear to let the vaginal area to air out . No scrubbing: spread the lips to let warm water rinse over labias and pat dry   [facility] [address] [place], [place] [address] Phone # [phone] Fax [phone]

## 2015-05-15 ENCOUNTER — Telehealth: Payer: Self-pay | Admitting: Oncology

## 2015-05-15 ENCOUNTER — Ambulatory Visit (HOSPITAL_COMMUNITY)
Admission: RE | Admit: 2015-05-15 | Discharge: 2015-05-15 | Disposition: A | Discharge status: Home / Self Care | Payer: 59 | Source: Ambulatory Visit | Attending: Oncology | Admitting: Oncology

## 2015-05-15 ENCOUNTER — Ambulatory Visit (HOSPITAL_BASED_OUTPATIENT_CLINIC_OR_DEPARTMENT_OTHER): Payer: 59 | Admitting: Oncology

## 2015-05-15 ENCOUNTER — Ambulatory Visit: Payer: 59 | Admitting: Physical Therapy

## 2015-05-15 DIAGNOSIS — C7951 Secondary malignant neoplasm of bone: Secondary | ICD-10-CM | POA: Diagnosis not present

## 2015-05-15 DIAGNOSIS — R102 Pelvic and perineal pain: Secondary | ICD-10-CM | POA: Diagnosis not present

## 2015-05-15 DIAGNOSIS — C50211 Malignant neoplasm of upper-inner quadrant of right female breast: Secondary | ICD-10-CM | POA: Insufficient documentation

## 2015-05-15 DIAGNOSIS — R935 Abnormal findings on diagnostic imaging of other abdominal regions, including retroperitoneum: Secondary | ICD-10-CM | POA: Insufficient documentation

## 2015-05-15 NOTE — Telephone Encounter (Signed)
Appointments made and avs printed °

## 2015-05-15 NOTE — Progress Notes (Signed)
[facility]  Telephone:(336) [phone] Fax:(336) [phone]     ID: [name] [name] OB: 24-Dec-1960  MR#: [id number]  INO#:[id number]  PCP: Pcp Not In System GYN:  [name] [name] SU:  OTHER MD: [name] [name], [name] [name], [name] [name], [name] [name], [name] [name]  CHIEF COMPLAINT: Stage IV breast cancer  CURRENT TREATMENT: Fulvestrant, (denosumab,) (palbociclib)  BREAST CANCER HISTORY: From doctor [name] [name]'s intake note 03/20/2004:  "The patient is a very pleasant 55 year old female, without significant past medical history.  Her family history is significant for a sister who at age 38 was diagnosed with invasive ductal carcinoma.  She is a breast cancer survivor at age 68 now.  The patient states that she has never really had a screening mammogram until October 2005, when she felt that it was time for her to start having mammograms done on a yearly basis.  Therefore, on 01/26/04, she underwent a screening mammogram and an abnormality was detected in the upper outer right breast.  She, therefore, underwent spot compression views of both the right and the left breast.  The left breast revealed a well-defined mass in the upper outer left quadrant, present at the 2 o'clock position, measuring 1.8 cm, 6 cm from the nipple.  This, by ultrasound, was felt to be a simple cyst measuring 1.8 cm.  On the right breast, a spiculated mass was noted in the upper outer right quadrant.  The ultrasound revealed a shadowing irregular solid mass at the 10:30 position, 9 cm from the nipple, measuring 1.2 cm in greatest dimension, correlating with the spiculated mass seen on the mammogram.  The right axilla was negative ultrasonically.  Because of this, the patient underwent a needle biopsy of the right breast and the biopsy was positive invasive mammary carcinoma that showed features consistent with a high-grade invasive ductal carcinoma associated with desmoplastic stroma.   No in situ component was seen and no definite lymphovascular invasion was identified.  On the core biopsy, the tumor measured about 0.8 cm.  Because of this, she was seen by [name] [name] and the patient was taken to the [facility] on March 15, 2004.  She underwent a right breast lumpectomy with sentinel node biopsy.  The final pathology revealed an invasive ductal carcinoma, measuring 1.7 cm, grade 2 of 3.  Margins were free of tumor.  Atypical lobular hyperplasia was noted.  One sentinel node was removed which was negative for metastatic disease.  The tumor was staged at T1c, N0 MX.  It was estrogen receptor positive, progesterone receptor positive.  HER-2/neu was 2+.  FISH was negative.  All margins were free of tumor.  She is now seen in Medical Oncology for further evaluation and management of this newly diagnosed T1c, node negative, stage I, invasive ductal carcinoma of the right breast."  Her subsequent history is as detailed below  INTERVAL HISTORY: [name] [name] returns today for follow-up of her stage IV breast cancer. The 2 big problems that she is worried about [name] [name] continuing pain in the right pubic area and side effects from the [place].. Of course resume her yesterday because she had had the rash on the radial aspect of her left wrist. That continues to improve and is pretty much resolved by today.  REVIEW OF SYiSTEMS:  [name] [name] tells me she had nausea although no vomiting with the grants. She developed some mucositis, which was attributed to thrush. She continues on nystatin but is using this very irregularly. He was very fatigued with  the eyebrows, she says. Some days she would've "run out of energy" in the middle of the morning, which is unusual for her. She feels if she were able to walk she could get more energy but she can't walk too much because of the pain in her right pubic area.  She is getting physical therapy for this twice a week. That is helping. She says overall the pain no longer  radiates down the right leg. It is in a small area. However it is significant and limiting. She is very concerned that this may represent recurrent tumor to that area but it could also be bone necrosis from the radiation or inflammation from the radiation. She really wanted to review her PET scan and other films today which we did. She is having some strange bowel movements but she is being fairly regular. Abdomen no unusual headaches, visual changes, nausea, vomiting, fever, or bleeding. The detailed review of systems today was otherwise stable.  PAST MEDICAL HISTORY: Past Medical History  Diagnosis Date  . Seizures ([place]) 2010    Isolated incident.  Marland Kitchen PONV (postoperative nausea and vomiting)   . Peripheral vascular disease ([place]) 02/2010    blood clot related to porta cath  . S/P radiation therapy 07/17/2014 through 08/02/2014     Left mediastinum, left seventh rib 3250 cGy in 13 sessions   . S/P radiation therapy 12/11/2014 through 12/22/2014     Left parietal calvarium 2400 cGy in 8 sessions   . Breast cancer ([place]) dx'd 2005/2011  . Bone metastases ([place]) dx'd 05/2014    PAST SURGICAL HISTORY: Past Surgical History  Procedure Laterality Date  . Breast lumpectomy  2005  . Axillary lymph node dissection  Dec. 2011  . Portacath placement  12/11  . Removal portacath    . Mediastinotomy chamberlain mcneil Left 06/02/2013    Procedure: MEDIASTINOTOMY CHAMBERLAIN MCNEIL;  Surgeon: [name] Nakayama, MD;  Location: [facility] OR;  Service: Thoracic;  Laterality: Left;  LEFT ANTERIOR MEDIASTINOTOMY     FAMILY HISTORY Family History  Problem Relation Age of Onset  . COPD Mother   . Breast cancer Sister 54   The patient's father is living, 72 years old as of may 2015. He lives in [place]. The patient's mother died from complications of COPD at the  age of 61. These has 2 brothers, one sister. Her sister developed breast cancer at the age of 78. She is doing well. The patient herself underwent genetic testing at [facility] in 2011 and was found to be BRCA negative  GYNECOLOGIC HISTORY:  Menarche age 75, she is GX P0. She stopped having periods with her initial chemotherapy in 2006.  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 Her husband [name] is a Medical illustrator in [place]. He has a child from a prior marriage. At home they have 2 rescue dogs, Hobo and [place]. The patient is religious but not a church attender    ADVANCED DIRECTIVES: In place; at the 08/04/2014 visit in particular the patient was very clear, with her husband present, that she would not want any kind of feeding tubes or "other tubes" if her condition deteriorated.   HEALTH MAINTENANCE: Social History  Substance Use Topics  . Smoking status: Never Smoker   . Smokeless tobacco: Never Used  . Alcohol Use: No     Colonoscopy:  PAP:  Bone density: March 2015; mild osteopenia  Lipid panel:  Allergies  Allergen Reactions  . 2nd Skin Quick Heal Other (See Comments)    Other Reaction: Skin peels  . Decadron [Dexamethasone] Other (See Comments)    Patient does not tolerate steroids.   . Dilaudid [Hydromorphone] Nausea And Vomiting  . Enoxaparin Other (See Comments)    unknown  . Fluconazole Swelling    Liver toxicity  . Hydromorphone Hcl Nausea And Vomiting  . Morphine And Related Nausea And Vomiting  . Protonix [Pantoprazole Sodium] Other (See Comments)    Patient reports it caused thrush.  . Tegaderm Ag Mesh [Silver]     Current Outpatient Prescriptions  Medication Sig Dispense Refill  . acetaminophen (TYLENOL) 160 mg/5 mL SOLN Reported on 03/27/2015    . ALPRAZolam (XANAX) 0.5 MG tablet Take 1 tablet (0.5 mg total) by mouth 2 (two) times daily as needed for anxiety. 30 tablet 0  . B  Complex-C (B-COMPLEX WITH VITAMIN C) tablet Take 1 tablet by mouth daily. Reported on 03/27/2015    . cholecalciferol 2000 UNITS tablet Take 1 tablet (2,000 Units total) by mouth daily.    . Diphenhyd-Hydrocort-Nystatin (FIRST-DUKES MOUTHWASH) SUSP 5-10 ml qid SWISH AND SPIT 314 mL 3  . folic acid (FOLVITE) 1 MG tablet Take 1 tablet (1 mg total) by mouth daily.    Marland Kitchen ibuprofen (ADVIL,MOTRIN) 200 MG tablet Take 400 mg by mouth every 4 (four) hours as needed for moderate pain. Reported on 03/27/2015    . magnesium chloride (SLOW-MAG) 64 MG TBEC SR tablet Take 60 mg by mouth daily.    . Melatonin 3 MG TABS Take 3 mg by mouth at bedtime.    . naproxen sodium (ANAPROX) 220 MG tablet Take 220 mg by mouth 2 (two) times daily with a meal.    . nystatin (MYCOSTATIN) 100000 UNIT/ML suspension Take 5 mLs (500,000 Units total) by mouth 4 (four) times daily. 240 mL 3  . palbociclib (IBRANCE) 75 MG capsule Take 1 capsule (75 mg total) by mouth daily with breakfast. Take whole with food. 21 capsule 6  . saccharomyces boulardii (FLORASTOR) 250 MG capsule Take 250 mg by mouth daily.      No current facility-administered medications for this visit.    OBJECTIVE: Middle-aged white woman who appears stated age There were no vitals filed for this visit.   There is no weight on file to calculate BMI.   There were no vitals filed for this visit.    Patient refused vitals today 05/15/2015     ECOG FS: 2  Sclerae unicteric, pupils round and equal Oropharynx clear and moist-- no thrush or other lesions noted No cervical or supraclavicular adenopathy Lungs no rales or rhonchi, fair excursion on the left, normal and right Heart regular rate and rhythm Abd soft, nontender, positive bowel sounds; focal tenderness in the right pubic area to deep palpation MSK no focal spinal tenderness, no upper extremity lymphedema Neuro: nonfocal, well oriented, appropriate affect Breasts: Deferred Skin: The shingles area anterior  in the right lower quadrant has almost completely resolved  LAB RESULTS:   CMP     Component Value Date/Time   NA 137 05/08/2015 1039   NA 140 12/14/2014 0800   K 3.8 05/08/2015 1039   K 3.6 12/14/2014 0800   CL 103 12/14/2014 0800   CL 105 05/06/2012 1333   CO2 26 05/08/2015 1039   CO2 28 12/14/2014 0800   GLUCOSE 88 05/08/2015 1039   GLUCOSE 100* 12/14/2014 0800   GLUCOSE 124* 05/06/2012 1333  BUN 8.9 05/08/2015 1039   BUN 6 12/14/2014 0800   CREATININE 0.8 05/08/2015 1039   CREATININE 0.64 12/14/2014 0800   CALCIUM 9.5 05/08/2015 1039   CALCIUM 9.6 12/14/2014 0800   PROT 7.2 05/08/2015 1039   PROT 5.6* 11/20/2014 0433   ALBUMIN 3.8 05/08/2015 1039   ALBUMIN 2.6* 11/20/2014 0433   AST 26 05/08/2015 1039   AST 28 11/20/2014 0433   ALT 35 05/08/2015 1039   ALT 62* 11/20/2014 0433   ALKPHOS 94 05/08/2015 1039   ALKPHOS 105 11/20/2014 0433   BILITOT 0.37 05/08/2015 1039   BILITOT 0.7 11/20/2014 0433   GFRNONAA >60 12/14/2014 0800   GFRAA >60 12/14/2014 0800    No results found for: SPEP  Lab Results  Component Value Date   WBC 4.0 05/14/2015   NEUTROABS 2.8 05/14/2015   HGB 12.7 05/14/2015   HCT 36.5 05/14/2015   MCV 92.9 05/14/2015   PLT 290 05/14/2015      Chemistry      Component Value Date/Time   NA 137 05/08/2015 1039   NA 140 12/14/2014 0800   K 3.8 05/08/2015 1039   K 3.6 12/14/2014 0800   CL 103 12/14/2014 0800   CL 105 05/06/2012 1333   CO2 26 05/08/2015 1039   CO2 28 12/14/2014 0800   BUN 8.9 05/08/2015 1039   BUN 6 12/14/2014 0800   CREATININE 0.8 05/08/2015 1039   CREATININE 0.64 12/14/2014 0800      Component Value Date/Time   CALCIUM 9.5 05/08/2015 1039   CALCIUM 9.6 12/14/2014 0800   ALKPHOS 94 05/08/2015 1039   ALKPHOS 105 11/20/2014 0433   AST 26 05/08/2015 1039   AST 28 11/20/2014 0433   ALT 35 05/08/2015 1039   ALT 62* 11/20/2014 0433   BILITOT 0.37 05/08/2015 1039   BILITOT 0.7 11/20/2014 0433       Lab Results   Component Value Date   LABCA2 25 11/02/2007    No components found for: [id number]  No results for input(s): INR in the last 168 hours.  Urinalysis    Component Value Date/Time   COLORURINE YELLOW 11/17/2014 0143   APPEARANCEUR CLOUDY* 11/17/2014 0143   LABSPEC 1.010 11/17/2014 0143   LABSPEC 1.005 09/12/2013 1542   PHURINE 6.5 11/17/2014 0143   PHURINE 6.0 09/12/2013 1542   GLUCOSEU NEGATIVE 11/17/2014 0143   GLUCOSEU Negative 09/12/2013 1542   HGBUR NEGATIVE 11/17/2014 0143   HGBUR Negative 09/12/2013 1542   BILIRUBINUR NEGATIVE 11/17/2014 0143   BILIRUBINUR Negative 09/12/2013 1542   KETONESUR NEGATIVE 11/17/2014 0143   KETONESUR Negative 09/12/2013 1542   PROTEINUR NEGATIVE 11/17/2014 0143   PROTEINUR Negative 09/12/2013 1542   UROBILINOGEN 0.2 11/17/2014 0143   UROBILINOGEN 0.2 09/12/2013 1542   NITRITE NEGATIVE 11/17/2014 0143   NITRITE Negative 09/12/2013 1542   LEUKOCYTESUR NEGATIVE 11/17/2014 0143   LEUKOCYTESUR Negative 09/12/2013 1542    STUDIES: Dg Chest 2 View  04/20/2015  CLINICAL DATA:  Evaluate left effusion. History of left breast cancer. Left posterior chest pain. EXAM: CHEST  2 VIEW COMPARISON:  01/08/2015 FINDINGS: There is small loculated left pleural effusion and/or pleural thickening, slightly decreased since prior study. Linear scarring or atelectasis in the left lung base. Left upper lobe consolidation is also unchanged. Right lung is clear. No acute bony abnormality. Heart is normal size. IMPRESSION: Slight decreased size of the loculated left pleural effusion and/or pleural thickening. Stable left upper lobe consolidation and left basilar linear scarring or atelectasis. Electronically Signed  By: Rolm Baptise M.D.   On: 04/20/2015 13:27   Dg Thoracic Spine 2 View  04/20/2015  CLINICAL DATA:  Metastatic breast cancer. EXAM: THORACIC SPINE 2 VIEWS COMPARISON:  CT scan of March 13, 2015. FINDINGS: Moderate dextroscoliosis of thoracic spine is noted.  No fracture or spondylolisthesis is noted. No definite evidence of lytic destruction is seen in the vertebral bodies, and sclerotic lesions seen at T1 on prior CT scan is not well visualized on this study. IMPRESSION: No fracture or spondylolisthesis is noted in the thoracic spine on this study. Electronically Signed   By: Marijo Conception, M.D.   On: 04/20/2015 13:01   Dg Lumbar Spine 2-3 Views  04/20/2015  CLINICAL DATA:  Possible metastatic lesions. EXAM: LUMBAR SPINE - 2-3 VIEW COMPARISON:  CT scan of March 22, 2015. FINDINGS: Moderate levoscoliosis of lower thoracic and lumbar spine is noted. No fracture or spondylolisthesis is noted. Disc spaces appear to be well maintained. No lytic or destructive bone lesion is seen on these images. IMPRESSION: Moderate levoscoliosis of lower thoracic and lumbar spine. No acute abnormality is seen in the lumbar spine. Electronically Signed   By: Marijo Conception, M.D.   On: 04/20/2015 12:58    ASSESSMENT: 55 y.o. [place] woman with stage IV breast cancer, history as follows  (1)  S/p Right lumpectomy and sentinel lymph node sampling 03/15/2004 for a pT1c pN0. Stage IA invasive ductal carcinoma, grade 2, estrogen receptor 95% positive, progesterone receptor 65% positive, HER-2 not amplified; additional surgery 04/25/2004 for seroma or clearance showed no residual tumor  (2) adjuvant chemotherapy with cyclophosphamide and doxorubicin every 21 days x4 completed 07/19/2004  (3) adjuvant radiation given under Dr. Donella Stade in [place] completed July 2006  (4) the patient opted against adjuvant antiestrogen therapy  (5) genetics testing showed no BRCA mutations  (6) biopsy of a palpable right axillary mass 10/24/2009 showed invasive ductal carcinoma, grade 3, estrogen receptor 100% positive, progesterone receptor 2% positive (alert score 5) HER-2 negative; no evidence of systemic disease on PET scanning  (7) completed 3 of 4 planned cycles of docetaxel and  cyclophosphamide September 2011, fourth cycle omitted because of marked elevations in liver function tests  (8) an right axillary lymph node dissection 03/06/2010 showed 3/8 lymph nodes removed to be involved by tumor, with extracapsular extension.  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, TTS-1 negative), estrogen receptor 80% positive, progesterone receptor 2% positive, HER-2 not amplified  (12) letrozole started March 2015-- tolerated with significant side effects, discontinued at the end of May 2015  (13) PET scan 08/16/2013 shows extensive left pleural metastatic disease and a large left pleural effusion that shifts cardiac and mediastinal structures to the right; adenopathy (celiac trunk, periadrenal, periaortic); and a left medial clavicular lesion; Status post left thoracentesis 08/16/2013 positive for adenocarcinoma, estrogen receptor positive, progesterone receptor negative.  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; everolimus was suggested as an available targeted agent  (18) exemestane started 03/31/2014, discontinued  10/31/2014 with evidence of progression  (a) everolimus added 04/03/2014 but not tolerated (cytopenias, elevated LFTs) even at minimal doses; stopped 04/17/2014  (19) fulvestrant started 12/20/2014  (a) palbociclib added at very low dose 04/03/2015 (starting dose 75 mg weekly)    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 treated with Lovenox for 5-6 months  (c) right upper extremity lymphedema--receiving physical therapy  (d) hepatic steatosis with chronically elevated LFTs as well as unusual hepatic sensitivity to chemotherapy  (e) osteopenia with the lowest T score -1.6 on bone density scan 06/20/2013  (f) radiation oncology (Dr Valere Dross) has reviewed prior radiation records in case there is further mediastinal involvement with dysphagia etc in which case palliative XRT could be considered  (a) radiation to left mediastinum/ left 7th rib 3250 cGy in 13 sessions04/18/2016 through 08/02/2014  (b) radiation to T11 area: 22 Gy in 7 sessions, last dose 11/27/2014  (c) radiation left parietal scalp region to be completed 12/22/2014  (d) radiation to sacral area completed 04/09/2015  (g) chest wall and perineal pain--improved post radiation treatments  (h) zoster diagnosed 04/04/2015-- on valacyclovir   PLAN: I spent more than 45 minutes with [name] today and of course we spent time last night as well and going over several issues.  First of all her left wrist looks much better today and I think there are no further concerns.  As far as the eye (concern she is going to start cycle 2 on 05/17/2015. She gets a little bit of nausea with it. She had some thrush within the first time and she is very worried that she may develop at the second time. When she may have had in addition to thrush of course is some mucositis. We'll have to keep a close eye on this. For the time  being I am suggesting she swish and spit at least twice a day with nystatin. Finally she gets fatigued from the medication. This is a common side effect. The best treatment for that would be exercise but right now she has a great deal of trouble walking and that is what took most of the visit today.  She has a pain at the right pubic area and palpation of the bone in that area elicits significant tenderness. We reviewed her old PET scan, from mid December, and she did have a hot spot there at the inferior pubic ramus. This was irradiated. Overall the pain is much better, and there is no longer any extension of the pain down the right leg. Nonetheless it is keeping her from walking normally and from exercising. She Buy coarse rim because movement of the leg is what hurts.  We're going to start with simple plain films of the pelvis just to make sure there is no fracture there. If there isn't then I think what we are dealing with is residual inflammation. One option would be to try a brief course of steroids. In the meantime she continues physical therapy twice weekly.  I suggested she use a cane on her right hand to take some of the weight off of a right leg. She is very reluctant to do that.  Otherwise the plan is to  continue the physical therapy twice weekly. As far as the eye (concern we are going to be checking her counts every Monday for the next several weeks. Her graft we need a baseline liver study since what we will be chiefly measuring is the liver lesion and I will try to get that accomplished within the next week or so. I would like her to have at least 2 more but preferably 3 more cycles of grants, which she will be receiving a 75 mg every other day, before repeating the liver MRI.  She has a good understanding of this plan. She are any problems develop before her next visit here, which will be the day of her fulvestrant and aches G vet dosing, 06/06/2015.     Chauncey Cruel, MD    05/15/2015 9:12 AM

## 2015-05-16 ENCOUNTER — Ambulatory Visit: Payer: 59

## 2015-05-17 ENCOUNTER — Other Ambulatory Visit: Payer: Self-pay | Admitting: Oncology

## 2015-05-17 ENCOUNTER — Telehealth: Payer: Self-pay | Admitting: Oncology

## 2015-05-17 ENCOUNTER — Encounter: Payer: Self-pay | Admitting: Physical Therapy

## 2015-05-17 ENCOUNTER — Ambulatory Visit: Payer: 59 | Admitting: Physical Therapy

## 2015-05-17 ENCOUNTER — Ambulatory Visit
Admission: RE | Admit: 2015-05-17 | Discharge: 2015-05-17 | Disposition: A | Discharge status: Home / Self Care | Payer: 59 | Source: Ambulatory Visit | Attending: Radiation Oncology | Admitting: Radiation Oncology

## 2015-05-17 DIAGNOSIS — R1031 Right lower quadrant pain: Secondary | ICD-10-CM

## 2015-05-17 DIAGNOSIS — C50211 Malignant neoplasm of upper-inner quadrant of right female breast: Secondary | ICD-10-CM

## 2015-05-17 DIAGNOSIS — C7951 Secondary malignant neoplasm of bone: Secondary | ICD-10-CM

## 2015-05-17 DIAGNOSIS — M6289 Other specified disorders of muscle: Secondary | ICD-10-CM

## 2015-05-17 NOTE — Progress Notes (Signed)
Patient returned today unaccompanied for one month follow up with Dr. Tammi [name] one month s/p completion of xrt. Patient refused to allow this RN to obtain weight, vitals or review medications. Patient refuses to be evaluated by any PA. Patient informs this RN an MRI to include her pelvis and liver is scheduled for next Wednesday. She participates in PT twice a week. Reports her right hip pain at the groin in more specific and no longer radiates down her right thigh. She explains that bearing weight on her right leg solely and scissor like motion with her legs increase pain. Reports she has been encouraged to ambulate with a cane if in fact she has a fracture but, she refuses. Reports increased fatigue and vaginal dryness associated with Ibrance. Requesting Dr. Tammi [name] review most recent scan with her. She understands the impression of this scan from 05/15/15 proves radiolucencies.

## 2015-05-17 NOTE — Therapy (Signed)
[facility] [address] [place], [place], [address] Phone: [phone]   Fax:  [phone]  Physical Therapy Treatment  Patient Details  Name: [name] [name] MRN: [id number] Date of Birth: May 28, 1960 Referring Provider: Dr. Lurline Del  Encounter Date: 05/17/2015      PT End of Session - 05/17/15 1309    Visit Number 25  pelvic   Date for PT Re-Evaluation 06/25/15   Authorization - Visit Number 83   Authorization - Number of Visits 25   PT Start Time 1230   PT Stop Time 1310   PT Time Calculation (min) 40 min   Activity Tolerance Patient tolerated treatment well   Behavior During Therapy [facility] for tasks assessed/performed      Past Medical History  Diagnosis Date  . Seizures ([place]) 2010    Isolated incident.  Marland Kitchen PONV (postoperative nausea and vomiting)   . Peripheral vascular disease ([place]) 02/2010    blood clot related to porta cath  . S/P radiation therapy 07/17/2014 through 08/02/2014     Left mediastinum, left seventh rib 3250 cGy in 13 sessions   . S/P radiation therapy 12/11/2014 through 12/22/2014     Left parietal calvarium 2400 cGy in 8 sessions   . Breast cancer ([place]) dx'd 2005/2011  . Bone metastases ([place]) dx'd 05/2014    Past Surgical History  Procedure Laterality Date  . Breast lumpectomy  2005  . Axillary lymph node dissection  Dec. 2011  . Portacath placement  12/11  . Removal portacath    . Mediastinotomy chamberlain mcneil Left 06/02/2013    Procedure: MEDIASTINOTOMY CHAMBERLAIN MCNEIL;  Surgeon: [name], MD;  Location: [facility];  Service: Thoracic;  Laterality: Left;  LEFT ANTERIOR MEDIASTINOTOMY     There were no vitals filed for this visit.  Visit Diagnosis:  Muscle stiffness  Groin pain, right      Subjective  Assessment - 05/17/15 1235    Subjective I have a special radialogist looking at everything.  I have a MRI next wednesday.  Last x-ray no fracture.  Dr. Tammi Klippel is radiological changes in the pelvic area. Dr. Tammi Klippel  feels tightness is from radiation and up to 6 months to see if get aggravated. I am using a cane at home.   How long can you walk comfortably? make patient limp   Diagnostic tests x rays of back show no fractures;    Patient Stated Goals walk 20 minutes with minimal limp   Currently in Pain? Yes   Pain Score 6    Pain Location Groin   Pain Orientation Right   Pain Descriptors / Indicators Constant   Pain Type Chronic pain   Pain Onset More than a month ago   Pain Frequency Constant   Aggravating Factors  walking   Pain Relieving Factors rest   Effect of Pain on Daily Activities walking with limp   Multiple Pain Sites No                      Pelvic Floor Special Questions - 05/17/15 0001    Pelvic Floor Internal Exam Patient approves physical therapist to perform pelvic floor muscle assessment   Exam Type Vaginal   Palpation soft tissue work internally in the righ tvaginal wall and extermially to right hip adductor, and medical right quadriceps           [facility] Adult PT Treatment/Exercise - 05/17/15 0001    Manual Therapy  Manual Therapy Soft tissue mobilization   Soft tissue mobilization right hip adductor, right hamstring                PT Education - 05/17/15 1309    Education provided No          PT Short Term Goals - 02/27/15 1011    PT SHORT TERM GOAL #1   Title pain with walking decreased >/= 25%   Time 3   Period Weeks   Status Achieved   PT SHORT TERM GOAL #2   Title pelvis stays in correct alignment for 3 weeks   Time 3   Period Weeks   Status Achieved           PT Long Term Goals - 05/14/15 1442    PT LONG TERM GOAL #1   Title indpendent with HEP   Time 6   Period Weeks   Status On-going   PT LONG TERM  GOAL #2   Title pain with walking decreased >/= 80%   Time 6   Period Weeks   Status On-going   PT LONG TERM GOAL #3   Title go up and down steps with a step over step pattern   Time 6   Period Weeks   Status On-going           Long Term Clinic Goals - 05/11/15 1210    CC Long Term Goal  #1   Title Reports left upper back and left side pain is controlled at 4/10 or less with therapy at the current frequency.   Status On-going   CC Long Term Goal  #2   Title Pt. will report swelling is adequately managed to enable ADL function at a consistent level.   Status On-going            Plan - 05/17/15 1309    Clinical Impression Statement Patient pain after therapy is 3/10 and minimal to no limp on right.  Patient has tenderness located on right ischial tuberosity.    Pt will benefit from skilled therapeutic intervention in order to improve on the following deficits Pain;Abnormal gait;Increased muscle spasms;Increased fascial restricitons   Rehab Potential Good   Clinical Impairments Affecting Rehab Potential active cancer   PT Frequency 2x / week   PT Duration 8 weeks   PT Treatment/Interventions Manual techniques;Patient/family education;Therapeutic exercise   PT Next Visit Plan soft tissue work   PT Home Exercise Plan progress as needed   Consulted and Agree with Plan of Care Patient   PT Plan Continue manual lymph drainage, soft tissue work.        Problem List Patient Active Problem List   Diagnosis Date Noted  . 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 Zoster 04/04/2015  . Nausea with vomiting 11/18/2014  . Constipation 11/18/2014  . Left-sided thoracic back pain   . Bone metastases ([place]) 11/16/2014  . Back pain 11/15/2014  . Uncontrolled pain 11/14/2014  . 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 Chest wall pain 03/21/2014  . Abnormal LFTs (liver function tests) 09/12/2013  . Breast cancer of upper-inner quadrant of right female breast  ([place]) 08/18/2013  . 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 [place], [place], [address] Phone: [phone]   Fax:  [phone]  Name: [name] [name] MRN: [id number] Date of Birth: [birth year]

## 2015-05-17 NOTE — Telephone Encounter (Signed)
Per desk nurse moved 2/20 iv access to 2/22 @ 2 pm to be prior to mri at 4 pm. Per desk nurse patient will keep lab as scheduled for 2/20. Left message for patient re changes. Also per desk nurse GM wants to change the order or the way the mri is to be done - desk nurse will speak with mri dept re changes.

## 2015-05-17 NOTE — Progress Notes (Signed)
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 Bone metastases (HCC) 198.5 C79.51     Interval Since Last Radiation: 5 weeks.  03/29/2015-04/09/2015: Right inferior pubic ramus metastasis was treated to 24 Gy in 4 fractions of 6 Gy.  Previous courses of radiation include radiotherapy to the left rib cage (4-07/2014), as well as thoracic spine radiation in August 2016 and left skull radiation in September 2016 by Dr. Valere Dross. She was given adjuvant radiation therapy to the right supraclavicular and axillary regions by Dr. Sandi Mealy at [facility], completing her therapy in March 2012. She underwent breast conserving surgery with Dr. Truitt Leep and adjuvant radiation therapy with Dr. Donella Stade in [place], completing radiation therapy in July 2006.  Narrative:  The patient returns today for routine follow-up. The patient refused to allow the RN to obtain weight, vitals or review medications. Patient refused to be evaluated by a PA. She participates in PT twice a week. Reports her right hip pain at the groin is more specific and no longer radiates down her right thigh. She explains that bearing weight on her right leg solely and scissor like motion with her legs increases pain. Reports she has been encouraged to ambulate with a cane if in fact she has a fracture, but has refused. Reports increased fatigue and vaginal dryness associated with Ibrance. She had a CT of the abd/pelvis on 2/14 that showed no acute fractures and radiolucencies within the right inferior pelvic ramus.  ALLERGIES:  is allergic to 2nd skin quick heal; decadron; dilaudid; enoxaparin; fluconazole; hydromorphone hcl; morphine  and related; protonix; and tegaderm ag mesh.  Meds: Current Outpatient Prescriptions  Medication Sig Dispense Refill  . acetaminophen (TYLENOL) 160 mg/5 mL SOLN Reported on 03/27/2015    . ALPRAZolam (XANAX) 0.5 MG tablet Take 1 tablet (0.5 mg total) by mouth 2 (two) times daily as needed for anxiety. 30 tablet 0  . B Complex-C (B-COMPLEX WITH VITAMIN C) tablet Take 1 tablet by mouth daily. Reported on 03/27/2015    . cholecalciferol 2000 UNITS tablet Take 1 tablet (2,000 Units total) by mouth daily.    . Diphenhyd-Hydrocort-Nystatin (FIRST-DUKES MOUTHWASH) SUSP 5-10 ml qid SWISH AND SPIT [id number] mL 3  . folic acid (FOLVITE) 1 MG tablet Take 1 tablet (1 mg total) by mouth daily.    Marland Kitchen ibuprofen (ADVIL,MOTRIN) 200 MG tablet Take 400 mg by mouth every 4 (four) hours as needed for moderate pain. Reported on 03/27/2015    . magnesium chloride (SLOW-MAG) 64 MG TBEC SR tablet Take 60 mg by mouth daily.    . Melatonin 3 MG TABS Take 3 mg by mouth at bedtime.    . naproxen sodium (ANAPROX) 220 MG tablet Take 220 mg by mouth 2 (two) times daily with a meal.    . nystatin (MYCOSTATIN) 100000 UNIT/ML suspension Take 5 mLs (500,000 Units total) by mouth 4 (four) times daily. 240 mL 3  . palbociclib (IBRANCE) 75 MG capsule Take 1 capsule (75 mg total) by mouth daily with breakfast. Take whole with food. 21 capsule 6  . saccharomyces boulardii (FLORASTOR) 250 MG capsule Take 250 mg by mouth daily.      No current facility-administered  medications for this encounter.    Physical Findings: The patient is in no acute distress. Patient is alert and oriented. .  No significant changes.  Lab Findings: Lab Results  Component Value Date   WBC 4.0 05/14/2015   WBC 6.6 03/20/2015   WBC 6.1 11/02/2007   HGB 12.7 05/14/2015   HGB 13.3 03/20/2015   HGB 13.8 11/02/2007   HCT 36.5 05/14/2015   HCT 39.3 03/20/2015   HCT 39.9 11/02/2007   PLT 290 05/14/2015   PLT 396 03/20/2015   PLT 323 11/02/2007    Lab  Results  Component Value Date   NA 137 05/08/2015   NA 140 12/14/2014   K 3.8 05/08/2015   K 3.6 12/14/2014   CHLORIDE 103 05/08/2015   CO2 26 05/08/2015   CO2 28 12/14/2014   GLUCOSE 88 05/08/2015   GLUCOSE 100* 12/14/2014   GLUCOSE 124* 05/06/2012   BUN 8.9 05/08/2015   BUN 6 12/14/2014   CREATININE 0.8 05/08/2015   CREATININE 0.64 12/14/2014   BILITOT 0.37 05/08/2015   BILITOT 0.7 11/20/2014   ALKPHOS 94 05/08/2015   ALKPHOS 105 11/20/2014   AST 26 05/08/2015   AST 28 11/20/2014   ALT 35 05/08/2015   ALT 62* 11/20/2014   PROT 7.2 05/08/2015   PROT 5.6* 11/20/2014   ALBUMIN 3.8 05/08/2015   ALBUMIN 2.6* 11/20/2014   CALCIUM 9.5 05/08/2015   CALCIUM 9.6 12/14/2014   ANIONGAP 9 05/08/2015   ANIONGAP 9 12/14/2014    Radiographic Findings: Dg Chest 2 View  04/20/2015  CLINICAL DATA:  Evaluate left effusion. History of left breast cancer. Left posterior chest pain. EXAM: CHEST  2 VIEW COMPARISON:  01/08/2015 FINDINGS: There is small loculated left pleural effusion and/or pleural thickening, slightly decreased since prior study. Linear scarring or atelectasis in the left lung base. Left upper lobe consolidation is also unchanged. Right lung is clear. No acute bony abnormality. Heart is normal size. IMPRESSION: Slight decreased size of the loculated left pleural effusion and/or pleural thickening. Stable left upper lobe consolidation and left basilar linear scarring or atelectasis. Electronically Signed   By: Rolm Baptise M.D.   On: 04/20/2015 13:27   Dg Thoracic Spine 2 View  04/20/2015  CLINICAL DATA:  Metastatic breast cancer. EXAM: THORACIC SPINE 2 VIEWS COMPARISON:  CT scan of March 13, 2015. FINDINGS: Moderate dextroscoliosis of thoracic spine is noted. No fracture or spondylolisthesis is noted. No definite evidence of lytic destruction is seen in the vertebral bodies, and sclerotic lesions seen at T1 on prior CT scan is not well visualized on this study. IMPRESSION: No  fracture or spondylolisthesis is noted in the thoracic spine on this study. Electronically Signed   By: Marijo Conception, M.D.   On: 04/20/2015 13:01   Dg Lumbar Spine 2-3 Views  04/20/2015  CLINICAL DATA:  Possible metastatic lesions. EXAM: LUMBAR SPINE - 2-3 VIEW COMPARISON:  CT scan of March 22, 2015. FINDINGS: Moderate levoscoliosis of lower thoracic and lumbar spine is noted. No fracture or spondylolisthesis is noted. Disc spaces appear to be well maintained. No lytic or destructive bone lesion is seen on these images. IMPRESSION: Moderate levoscoliosis of lower thoracic and lumbar spine. No acute abnormality is seen in the lumbar spine. Electronically Signed   By: Marijo Conception, M.D.   On: 04/20/2015 12:58   Dg Pelvis Comp Min 3v  05/15/2015  CLINICAL DATA:  Right pubic pain, history of breast carcinoma, no injury EXAM: [facility] PELVIS - 3+  VIEW COMPARISON:  CT abdomen pelvis of 03/22/2015 FINDINGS: No acute fracture is seen. Mild degenerative change in the hips is noted. The pelvic rami appear intact. There are a few radiolucencies within the right inferior pelvic ramus of uncertain significance. Lytic metastases cannot be excluded. The SI joints are unremarkable. There are degenerative changes in the lower lumbar spine. IMPRESSION: 1. No acute fracture. 2. Radiolucencies within the right inferior pelvic ramus of uncertain significance. Correlate clinically. Electronically Signed   By: Ivar Drape M.D.   On: 05/15/2015 09:56    Impression:  The patient is recovering from the effects of radiation.  Plan: She has an MRI of the abdomen, liver, and pelvis on 05/23/15.   _____________________________________  Sheral Apley. Tammi Klippel, M.D.  This document serves as a record of services personally performed by Tyler Pita, MD. It was created on his behalf by Darcus Austin, a trained medical scribe. The creation of this record is based on the scribe's personal observations and the provider's statements  to them. This document has been checked and approved by the attending provider.

## 2015-05-17 NOTE — Progress Notes (Unsigned)
[name] [name] came back today to discuss results of her pelvic studies, which do show some changes as expected, but doesn't really tell us if were looking at inflammation, residual tumor, or necrosis in the area from the prior treatment. I discussed the situation with [name] [name] and he suggested possibly and MRI of the pelvis would be the very best test to try to help [place] sort this out. An order has been placed.  She is also scheduled for an MRI of the abdomen so we can obtain a new baseline on her liver as we proceed with a new treatment, namely the palbociclib.  She has an appointment with [name] [name] whom she will see today to discuss these fields.

## 2015-05-18 ENCOUNTER — Telehealth: Payer: Self-pay | Admitting: Oncology

## 2015-05-18 ENCOUNTER — Ambulatory Visit: Payer: 59 | Admitting: Physical Therapy

## 2015-05-18 DIAGNOSIS — R1031 Right lower quadrant pain: Secondary | ICD-10-CM | POA: Diagnosis not present

## 2015-05-18 DIAGNOSIS — I890 Lymphedema, not elsewhere classified: Secondary | ICD-10-CM

## 2015-05-18 DIAGNOSIS — M25551 Pain in right hip: Secondary | ICD-10-CM

## 2015-05-18 NOTE — Telephone Encounter (Signed)
Per desk nurse moved 2/22 iv access for scans to 3/1 @ 1:30 pm due to scans have moved to 3/1 @ 3 pm. Left message for patient re change - patient to get new schedule 2/20.

## 2015-05-18 NOTE — Therapy (Signed)
[place], [place], [address] Phone: [phone]   Fax:  [phone]  Physical Therapy Treatment  Patient Details  Name: [name] [name] MRN: [id number] Date of Birth: [birth year] Referring Provider: Dr. Lurline Del  Encounter Date: 05/18/2015      PT End of Session - 05/18/15 1233    Visit Number 26  lymphedema   Number of Visits 24   Date for PT Re-Evaluation 06/25/15   PT Start Time 1024   PT Stop Time 1108   PT Time Calculation (min) 44 min   Activity Tolerance Patient tolerated treatment well   Behavior During Therapy [facility] for tasks assessed/performed      Past Medical History  Diagnosis Date  . Seizures ([place]) 2010    Isolated incident.  Marland Kitchen PONV (postoperative nausea and vomiting)   . Peripheral vascular disease ([place]) 02/2010    blood clot related to porta cath  . S/P radiation therapy 07/17/2014 through 08/02/2014     Left mediastinum, left seventh rib 3250 cGy in 13 sessions   . S/P radiation therapy 12/11/2014 through 12/22/2014     Left parietal calvarium 2400 cGy in 8 sessions   . Breast cancer ([place]) dx'd 2005/2011  . Bone metastases ([place]) dx'd 05/2014    Past Surgical History  Procedure Laterality Date  . Breast lumpectomy  2005  . Axillary lymph node dissection  Dec. 2011  . Portacath placement  12/11  . Removal portacath    . Mediastinotomy chamberlain mcneil Left 06/02/2013    Procedure: MEDIASTINOTOMY CHAMBERLAIN MCNEIL;  Surgeon: [name], MD;  Location: [facility];  Service: Thoracic;  Laterality: Left;  LEFT ANTERIOR MEDIASTINOTOMY     There were no vitals filed for this visit.  Visit Diagnosis:  Lymphedema  Pain in joint involving right pelvic region and thigh      Subjective Assessment - 05/18/15  1025    Subjective Not happy about using a cane again but is doing it for walking distances.   Currently in Pain? Yes   Pain Score 6    Pain Location Groin   Pain Orientation Right   Pain Descriptors / Indicators Constant   Aggravating Factors  walking   Pain Relieving Factors rest, using cane   Pain Score 6   Pain Location Axilla   Pain Orientation Right   Aggravating Factors  more swelling                         [facility] Adult PT Treatment/Exercise - 05/18/15 0001    Manual Therapy   Myofascial Release right axilla with focus at superior scar tightness   Manual Lymphatic Drainage (MLD) In left sidelying, posterior interaxillary anastomosis and right axillo-inguinal anastomosis; in supine, short neck, left axilla and anterior interaxillary anastomosis, right groin and axillo-inguinal anastomosis, area between right breast scars, directing toward pathways, and right upper arm.  In right sidelying, left periscapular area toward left groin.   Other Manual Therapy soft tissue work in right sidelying to right superior thigh, medial and posterior aspects                PT Education - 05/17/15 1309    Education provided No          PT Short Term Goals - 02/27/15 1011    PT SHORT TERM GOAL #1   Title pain with walking decreased >/= 25%   Time 3   Period Weeks  Status Achieved   PT SHORT TERM GOAL #2   Title pelvis stays in correct alignment for 3 weeks   Time 3   Period Weeks   Status Achieved           PT Long Term Goals - 05/14/15 1442    PT LONG TERM GOAL #1   Title indpendent with HEP   Time 6   Period Weeks   Status On-going   PT LONG TERM GOAL #2   Title pain with walking decreased >/= 80%   Time 6   Period Weeks   Status On-going   PT LONG TERM GOAL #3   Title go up and down steps with a step over step pattern   Time 6   Period Weeks   Status On-going           Long Term Clinic Goals - 05/18/15 1237    CC Long Term Goal  #1    Title Reports left upper back and left side pain is controlled at 4/10 or less with therapy at the current frequency.   Status On-going   CC Long Term Goal  #2   Title Pt. will report swelling is adequately managed to enable ADL function at a consistent level.   Status On-going            Plan - 05/18/15 1233    Clinical Impression Statement Patient comes in today using the cane again for gait.  She is unhappy about this but has agreed to use it for the time being.  She does not like having people pity her by seeing it.  She continues to benefit from therapy to reduce swelling and reduce pain from muscle tightness.   Pt will benefit from skilled therapeutic intervention in order to improve on the following deficits Pain;Abnormal gait;Increased muscle spasms;Increased fascial restricitons   Rehab Potential Good   Clinical Impairments Affecting Rehab Potential active cancer   PT Frequency 2x / week   PT Duration 12 weeks   PT Treatment/Interventions Manual lymph drainage;Manual techniques   PT Next Visit Plan manual lymph drainage, soft tissue work   Consulted and Agree with Plan of Care Patient        Problem List Patient Active Problem List   Diagnosis Date Noted  . 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 Zoster 04/04/2015  . Nausea with vomiting 11/18/2014  . Constipation 11/18/2014  . Left-sided thoracic back pain   . Bone metastases ([place]) 11/16/2014  . Back pain 11/15/2014  . Uncontrolled pain 11/14/2014  . 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 Chest wall pain 03/21/2014  . Abnormal LFTs (liver function tests) 09/12/2013  . Breast cancer of upper-inner quadrant of right female breast ([place]) 08/18/2013  . 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

## 2015-05-21 ENCOUNTER — Other Ambulatory Visit (HOSPITAL_BASED_OUTPATIENT_CLINIC_OR_DEPARTMENT_OTHER): Payer: 59

## 2015-05-21 ENCOUNTER — Ambulatory Visit: Payer: 59 | Admitting: Physical Therapy

## 2015-05-21 DIAGNOSIS — R1031 Right lower quadrant pain: Secondary | ICD-10-CM | POA: Diagnosis not present

## 2015-05-21 DIAGNOSIS — I890 Lymphedema, not elsewhere classified: Secondary | ICD-10-CM

## 2015-05-21 DIAGNOSIS — C771 Secondary and unspecified malignant neoplasm of intrathoracic lymph nodes: Secondary | ICD-10-CM

## 2015-05-21 DIAGNOSIS — J90 Pleural effusion, not elsewhere classified: Secondary | ICD-10-CM

## 2015-05-21 DIAGNOSIS — C7951 Secondary malignant neoplasm of bone: Secondary | ICD-10-CM | POA: Diagnosis not present

## 2015-05-21 DIAGNOSIS — C50211 Malignant neoplasm of upper-inner quadrant of right female breast: Secondary | ICD-10-CM

## 2015-05-21 DIAGNOSIS — M6289 Other specified disorders of muscle: Secondary | ICD-10-CM

## 2015-05-21 LAB — CBC WITH DIFFERENTIAL/PLATELET
BASO%: 0.6 % (ref 0.0–2.0)
Basophils Absolute: 0 10*3/uL (ref 0.0–0.1)
EOS ABS: 0.1 10*3/uL (ref 0.0–0.5)
EOS%: 1.5 % (ref 0.0–7.0)
HCT: 37.6 % (ref 34.8–46.6)
HGB: 13.1 g/dL (ref 11.6–15.9)
LYMPH%: 16.2 % (ref 14.0–49.7)
MCH: 32.3 pg (ref 25.1–34.0)
MCHC: 34.8 g/dL (ref 31.5–36.0)
MCV: 92.6 fL (ref 79.5–101.0)
MONO#: 0.4 10*3/uL (ref 0.1–0.9)
MONO%: 9.1 % (ref 0.0–14.0)
NEUT%: 72.6 % (ref 38.4–76.8)
NEUTROS ABS: 3.4 10*3/uL (ref 1.5–6.5)
NRBC: 0 % (ref 0–0)
Platelets: 295 10*3/uL (ref 145–400)
RBC: 4.06 10*6/uL (ref 3.70–5.45)
RDW: 14.9 % — AB (ref 11.2–14.5)
WBC: 4.7 10*3/uL (ref 3.9–10.3)
lymph#: 0.8 10*3/uL — ABNORMAL LOW (ref 0.9–3.3)

## 2015-05-21 NOTE — Therapy (Signed)
[place], [place], [address] Phone: [phone]   Fax:  [phone]  Physical Therapy Treatment  Patient Details  Name: [name] [name] MRN: [id number] Date of Birth: April 04, 1960 Referring Provider: Dr. Lurline Del  Encounter Date: 05/21/2015      PT End of Session - 05/21/15 2213    Visit Number 27   Number of Visits 24   Date for PT Re-Evaluation 06/25/15   PT Start Time 1025   PT Stop Time 1102   PT Time Calculation (min) 37 min   Activity Tolerance Patient tolerated treatment well   Behavior During Therapy [facility] for tasks assessed/performed      Past Medical History  Diagnosis Date  . Seizures ([place]) 2010    Isolated incident.  Marland Kitchen PONV (postoperative nausea and vomiting)   . Peripheral vascular disease ([place]) 02/2010    blood clot related to porta cath  . S/P radiation therapy 07/17/2014 through 08/02/2014     Left mediastinum, left seventh rib 3250 cGy in 13 sessions   . S/P radiation therapy 12/11/2014 through 12/22/2014     Left parietal calvarium 2400 cGy in 8 sessions   . Breast cancer ([place]) dx'd 2005/2011  . Bone metastases ([place]) dx'd 05/2014    Past Surgical History  Procedure Laterality Date  . Breast lumpectomy  2005  . Axillary lymph node dissection  Dec. 2011  . Portacath placement  12/11  . Removal portacath    . Mediastinotomy chamberlain mcneil Left 06/02/2013    Procedure: MEDIASTINOTOMY CHAMBERLAIN MCNEIL;  Surgeon: [name], MD;  Location: [place];  Service: Thoracic;  Laterality: Left;  LEFT ANTERIOR MEDIASTINOTOMY     There were no vitals filed for this visit.  Visit Diagnosis:  Lymphedema  Muscle stiffness      Subjective Assessment - 05/21/15 1027    Currently in Pain? Yes   Pain Score 6     Pain Location Groin   Pain Orientation Right   Aggravating Factors  walking   Pain Relieving Factors rest, using cane, Aleve   Pain Score 6   Pain Location Axilla   Pain Orientation Right                         [facility] Adult PT Treatment/Exercise - 05/21/15 0001    Manual Therapy   Myofascial Release right axilla with focus at superior scar tightness   Manual Lymphatic Drainage (MLD) In left sidelying, posterior interaxillary anastomosis and right axillo-inguinal anastomosis; in supine, short neck, left axilla and anterior interaxillary anastomosis, right groin and axillo-inguinal anastomosis, area between right breast scars, directing toward pathways.  In right sidelying, left periscapular area toward left groin.   Other Manual Therapy soft tissue work at right thigh in right sidelying with focus on posterolateral aspect.                  PT Short Term Goals - 02/27/15 1011    PT SHORT TERM GOAL #1   Title pain with walking decreased >/= 25%   Time 3   Period Weeks   Status Achieved   PT SHORT TERM GOAL #2   Title pelvis stays in correct alignment for 3 weeks   Time 3   Period Weeks   Status Achieved           PT Long Term Goals - 05/14/15 1442    PT LONG TERM GOAL #1   Title  indpendent with HEP   Time 6   Period Weeks   Status On-going   PT LONG TERM GOAL #2   Title pain with walking decreased >/= 80%   Time 6   Period Weeks   Status On-going   PT LONG TERM GOAL #3   Title go up and down steps with a step over step pattern   Time 6   Period Weeks   Status On-going           Long Term Clinic Goals - 05/18/15 1237    CC Long Term Goal  #1   Title Reports left upper back and left side pain is controlled at 4/10 or less with therapy at the current frequency.   Status On-going   CC Long Term Goal  #2   Title Pt. will report swelling is adequately managed to enable ADL function at a consistent level.   Status On-going             Plan - 05/21/15 2214    Clinical Impression Statement Continues to use the cane for gait.  Right breast not only with swelling and induration today, but tender to touch.   Pt will benefit from skilled therapeutic intervention in order to improve on the following deficits Pain;Abnormal gait;Increased muscle spasms;Increased fascial restricitons   Rehab Potential Good   Clinical Impairments Affecting Rehab Potential active cancer   PT Frequency 2x / week   PT Duration 12 weeks   PT Treatment/Interventions Manual lymph drainage;Manual techniques   PT Next Visit Plan manual lymph drainage, soft tissue work   Consulted and Agree with Plan of Care Patient        Problem List Patient Active Problem List   Diagnosis Date Noted  . 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 Zoster 04/04/2015  . Nausea with vomiting 11/18/2014  . Constipation 11/18/2014  . Left-sided thoracic back pain   . Bone metastases ([place]) 11/16/2014  . Back pain 11/15/2014  . Uncontrolled pain 11/14/2014  . 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 Chest wall pain 03/21/2014  . Abnormal LFTs (liver function tests) 09/12/2013  . Breast cancer of upper-inner quadrant of right female breast ([place]) 08/18/2013  . 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

## 2015-05-22 ENCOUNTER — Ambulatory Visit: Payer: 59 | Admitting: Physical Therapy

## 2015-05-22 ENCOUNTER — Encounter: Payer: Self-pay | Admitting: Physical Therapy

## 2015-05-22 ENCOUNTER — Other Ambulatory Visit: Payer: Self-pay | Admitting: *Deleted

## 2015-05-22 ENCOUNTER — Telehealth: Payer: Self-pay | Admitting: *Deleted

## 2015-05-22 DIAGNOSIS — M6289 Other specified disorders of muscle: Secondary | ICD-10-CM

## 2015-05-22 DIAGNOSIS — M25551 Pain in right hip: Secondary | ICD-10-CM

## 2015-05-22 DIAGNOSIS — R1031 Right lower quadrant pain: Secondary | ICD-10-CM | POA: Diagnosis not present

## 2015-05-22 NOTE — Telephone Encounter (Signed)
This RN contacted [facility] MD line at [phone] with additional clinical data.  Per review with Wells Guiles - nurse case reviewer this RN obtained a prior authorization for MRI of pelvis.  Number - [id number]. Valid for 45 days and not guarantee for payment.  The above will be sent to [facility] in managed care.

## 2015-05-22 NOTE — Therapy (Signed)
[facility] [address], [place], [place], [address] Phone: [phone]   Fax:  [phone]  Physical Therapy Treatment  Patient Details  Name: [name] [name] MRN: [id number] Date of Birth: 05-28-1960 Referring Provider: Dr. Lurline Del  Encounter Date: 05/22/2015      PT End of Session - 05/22/15 1238    Visit Number 28  pelvis   Date for PT Re-Evaluation 06/25/15   Authorization - Visit Number 33   Authorization - Number of Visits 28   PT Start Time [id number]   PT Stop Time 1314   PT Time Calculation (min) 38 min   Activity Tolerance Patient tolerated treatment well   Behavior During Therapy [facility] for tasks assessed/performed      Past Medical History  Diagnosis Date  . Seizures ([name]) 2010    Isolated incident.  Marland Kitchen PONV (postoperative nausea and vomiting)   . Peripheral vascular disease ([place]) 02/2010    blood clot related to porta cath  . S/P radiation therapy 07/17/2014 through 08/02/2014     Left mediastinum, left seventh rib 3250 cGy in 13 sessions   . S/P radiation therapy 12/11/2014 through 12/22/2014     Left parietal calvarium 2400 cGy in 8 sessions   . Breast cancer ([place]) dx'd 2005/2011  . Bone metastases ([place]) dx'd 05/2014    Past Surgical History  Procedure Laterality Date  . Breast lumpectomy  2005  . Axillary lymph node dissection  Dec. 2011  . Portacath placement  12/11  . Removal portacath    . Mediastinotomy chamberlain mcneil Left 06/02/2013    Procedure: MEDIASTINOTOMY CHAMBERLAIN MCNEIL;  Surgeon: [name], MD;  Location: [place];  Service: Thoracic;  Laterality: Left;  LEFT ANTERIOR MEDIASTINOTOMY     There were no vitals filed for this visit.  Visit Diagnosis:  Muscle stiffness  Pain in joint involving right pelvic  region and thigh  Groin pain, right      Subjective Assessment - 05/22/15 1239    Subjective I did alot of walking and have increased pain   Pertinent History patient vomited just prior to treatment today   How long can you walk comfortably? make patient limp   Diagnostic tests x rays of back show no fractures;    Patient Stated Goals walk 20 minutes with minimal limp   Currently in Pain? Yes   Pain Score 7    Pain Location Groin   Pain Orientation Right   Pain Descriptors / Indicators Constant   Pain Type Chronic pain   Pain Onset More than a month ago   Pain Frequency Constant   Aggravating Factors  walking   Pain Relieving Factors rest and using cane, Aleve   Effect of Pain on Daily Activities walk with a limp   Multiple Pain Sites No                      Pelvic Floor Special Questions - 05/22/15 0001    Pelvic Floor Internal Exam Patient approves physical therapist to perform pelvic floor muscle assessment   Exam Type Vaginal           [facility] Adult PT Treatment/Exercise - 05/22/15 0001    Manual Therapy   Manual Therapy Soft tissue mobilization;Internal Pelvic Floor   Soft tissue mobilization around right ischial tuberosity, and hamstring, hip adductors   Internal Pelvic Floor right obturator internist, right levaotr ani , left puborectalis  PT Short Term Goals - 02/27/15 1011    PT SHORT TERM GOAL #1   Title pain with walking decreased >/= 25%   Time 3   Period Weeks   Status Achieved   PT SHORT TERM GOAL #2   Title pelvis stays in correct alignment for 3 weeks   Time 3   Period Weeks   Status Achieved           PT Long Term Goals - 05/22/15 1316    PT LONG TERM GOAL #1   Title indpendent with HEP   Time 6   Period Weeks   Status On-going   PT LONG TERM GOAL #2   Title pain with walking decreased >/= 80%   Time 6   Period Weeks   Status On-going   PT LONG TERM GOAL #3   Title go up and down steps with  a step over step pattern   Time 6   Period Weeks   Status On-going           Long Term Clinic Goals - 05/18/15 1237    CC Long Term Goal  #1   Title Reports left upper back and left side pain is controlled at 4/10 or less with therapy at the current frequency.   Status On-going   CC Long Term Goal  #2   Title Pt. will report swelling is adequately managed to enable ADL function at a consistent level.   Status On-going            Plan - 05/22/15 1313    Clinical Impression Statement Patient pain decreased to 3/10 after soft tissue work.  Patient had 50% decreased limp on right after therapy. Tightness after therapy decreased by 50%.  Patient is not able to use her flexi touch suit for her UE lymphadema due to it increaseing  her pelvic pain.    Pt will benefit from skilled therapeutic intervention in order to improve on the following deficits Pain;Abnormal gait;Increased muscle spasms;Increased fascial restricitons   Rehab Potential Good   Clinical Impairments Affecting Rehab Potential active cancer   PT Frequency 2x / week   PT Duration 8 weeks   PT Treatment/Interventions Therapeutic activities;Therapeutic exercise;Manual techniques;Patient/family education;Neuromuscular re-education   PT Next Visit Plan soft tissue work   PT Home Exercise Plan progress as needed   Consulted and Agree with Plan of Care Patient   PT Plan Continue manual lymph drainage, soft tissue work.        Problem List Patient Active Problem List   Diagnosis Date Noted  . 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 Zoster 04/04/2015  . Nausea with vomiting 11/18/2014  . Constipation 11/18/2014  . Left-sided thoracic back pain   . Bone metastases ([place]) 11/16/2014  . Back pain 11/15/2014  . Uncontrolled pain 11/14/2014  . 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 Chest wall pain 03/21/2014  . Abnormal LFTs (liver function tests) 09/12/2013  . Breast cancer of upper-inner quadrant  of right female breast ([place]) 08/18/2013  . 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 [place], [place], [address] Phone: [phone]   Fax:  [phone]  Name: [name] [name] MRN: [id number] Date of Birth: 11-30-1960

## 2015-05-23 ENCOUNTER — Other Ambulatory Visit: Payer: Self-pay | Admitting: Oncology

## 2015-05-23 ENCOUNTER — Ambulatory Visit (HOSPITAL_COMMUNITY): Payer: 59

## 2015-05-23 ENCOUNTER — Ambulatory Visit: Payer: 59

## 2015-05-23 ENCOUNTER — Ambulatory Visit (HOSPITAL_COMMUNITY): Admission: RE | Admit: 2015-05-23 | Payer: 59 | Source: Ambulatory Visit

## 2015-05-23 DIAGNOSIS — C7951 Secondary malignant neoplasm of bone: Secondary | ICD-10-CM

## 2015-05-23 DIAGNOSIS — C50211 Malignant neoplasm of upper-inner quadrant of right female breast: Secondary | ICD-10-CM

## 2015-05-24 ENCOUNTER — Encounter: Payer: Self-pay | Admitting: Physical Therapy

## 2015-05-24 ENCOUNTER — Ambulatory Visit: Payer: 59 | Admitting: Physical Therapy

## 2015-05-24 DIAGNOSIS — R1031 Right lower quadrant pain: Secondary | ICD-10-CM | POA: Diagnosis not present

## 2015-05-24 DIAGNOSIS — M25551 Pain in right hip: Secondary | ICD-10-CM

## 2015-05-24 DIAGNOSIS — M6289 Other specified disorders of muscle: Secondary | ICD-10-CM

## 2015-05-24 NOTE — Therapy (Signed)
[facility] [address] [place], [place], [address] Phone: [phone]   Fax:  [phone]  Physical Therapy Treatment  Patient Details  Name: [name] [name] MRN: [id number] Date of Birth: 1960-08-08 Referring Provider: Dr. Lurline Del  Encounter Date: 05/24/2015      PT End of Session - 05/24/15 1308    Visit Number 29  pelvis   Date for PT Re-Evaluation 06/25/15   Authorization - Visit Number 46   Authorization - Number of Visits 29   PT Start Time 1230   PT Stop Time 1310   PT Time Calculation (min) 40 min   Activity Tolerance Patient tolerated treatment well   Behavior During Therapy [facility] for tasks assessed/performed      Past Medical History  Diagnosis Date  . Seizures ([place]) 2010    Isolated incident.  Marland Kitchen PONV (postoperative nausea and vomiting)   . Peripheral vascular disease ([place]) 02/2010    blood clot related to porta cath  . S/P radiation therapy 07/17/2014 through 08/02/2014     Left mediastinum, left seventh rib 3250 cGy in 13 sessions   . S/P radiation therapy 12/11/2014 through 12/22/2014     Left parietal calvarium 2400 cGy in 8 sessions   . Breast cancer ([place]) dx'd 2005/2011  . Bone metastases ([place]) dx'd 05/2014    Past Surgical History  Procedure Laterality Date  . Breast lumpectomy  2005  . Axillary lymph node dissection  Dec. 2011  . Portacath placement  12/11  . Removal portacath    . Mediastinotomy chamberlain mcneil Left 06/02/2013    Procedure: MEDIASTINOTOMY CHAMBERLAIN MCNEIL;  Surgeon: [name], MD;  Location: [place];  Service: Thoracic;  Laterality: Left;  LEFT ANTERIOR MEDIASTINOTOMY     There were no vitals filed for this visit.  Visit Diagnosis:  Muscle stiffness  Pain in joint involving right pelvic  region and thigh  Groin pain, right      Subjective Assessment - 05/24/15 1236    Subjective I woke up in the middle of the night due to pain.  The pain area is more concentrated. I was walking better yesterday from the therapy.    How long can you walk comfortably? make patient limp   Diagnostic tests x rays of back show no fractures;    Patient Stated Goals walk 20 minutes with minimal limp   Currently in Pain? Yes   Pain Score 7    Pain Location Groin   Pain Orientation Right   Pain Descriptors / Indicators Constant   Pain Type Chronic pain   Pain Onset More than a month ago   Pain Frequency Constant   Aggravating Factors  walking   Pain Relieving Factors reat and using cane   Effect of Pain on Daily Activities walk with a limp   Multiple Pain Sites No                         [facility] Adult PT Treatment/Exercise - 05/24/15 0001    Manual Therapy   Manual Therapy Soft tissue mobilization;Internal Pelvic Floor   Soft tissue mobilization around right ischial tuberosity, and hamstring, hip adductors   Internal Pelvic Floor right obturator internist, right levaotr ani , left puborectalis                PT Education - 05/24/15 1308    Education provided No  PT Short Term Goals - 02/27/15 1011    PT SHORT TERM GOAL #1   Title pain with walking decreased >/= 25%   Time 3   Period Weeks   Status Achieved   PT SHORT TERM GOAL #2   Title pelvis stays in correct alignment for 3 weeks   Time 3   Period Weeks   Status Achieved           PT Long Term Goals - 05/22/15 1316    PT LONG TERM GOAL #1   Title indpendent with HEP   Time 6   Period Weeks   Status On-going   PT LONG TERM GOAL #2   Title pain with walking decreased >/= 80%   Time 6   Period Weeks   Status On-going   PT LONG TERM GOAL #3   Title go up and down steps with a step over step pattern   Time 6   Period Weeks   Status On-going           Long Term Clinic  Goals - 05/18/15 1237    CC Long Term Goal  #1   Title Reports left upper back and left side pain is controlled at 4/10 or less with therapy at the current frequency.   Status On-going   CC Long Term Goal  #2   Title Pt. will report swelling is adequately managed to enable ADL function at a consistent level.   Status On-going            Plan - 05/24/15 1308    Clinical Impression Statement Patient reports her pain is more concentrated at same level and no webbing feeling anymore.  Pain after therapy decreased by 505.  No thickness felt on the left introitus today.  patient was able to have one day she had minimal difficulty with walking. Patient would benefit from physicla therapy to reduce pain.    Pt will benefit from skilled therapeutic intervention in order to improve on the following deficits Pain;Abnormal gait;Increased muscle spasms;Increased fascial restricitons   Rehab Potential Good   Clinical Impairments Affecting Rehab Potential active cancer   PT Frequency 2x / week   PT Duration 8 weeks   PT Treatment/Interventions Therapeutic activities;Therapeutic exercise;Manual techniques;Patient/family education;Neuromuscular re-education   PT Next Visit Plan soft tissue work   PT Home Exercise Plan progress as needed   Consulted and Agree with Plan of Care Patient   PT Plan Continue manual lymph drainage, soft tissue work.        Problem List Patient Active Problem List   Diagnosis Date Noted  . 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 Zoster 04/04/2015  . Nausea with vomiting 11/18/2014  . Constipation 11/18/2014  . Left-sided thoracic back pain   . Bone metastases ([place]) 11/16/2014  . Back pain 11/15/2014  . Uncontrolled pain 11/14/2014  . 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 Chest wall pain 03/21/2014  . Abnormal LFTs (liver function tests) 09/12/2013  . Breast cancer of upper-inner quadrant of right female breast ([place]) 08/18/2013  . Secondary  malignant neoplasm of mediastinal lymph node ([place]) 08/18/2013    [name], PT 05/24/2015 1:12 PM   [facility] [address] [place], [place], [address] Phone: [phone]   Fax:  [phone]  Name: [name] [name] MRN: [id number] Date of Birth: Sep 27, 1960

## 2015-05-25 ENCOUNTER — Ambulatory Visit: Payer: 59 | Admitting: Physical Therapy

## 2015-05-25 DIAGNOSIS — M549 Dorsalgia, unspecified: Secondary | ICD-10-CM

## 2015-05-25 DIAGNOSIS — I890 Lymphedema, not elsewhere classified: Secondary | ICD-10-CM

## 2015-05-25 DIAGNOSIS — R1031 Right lower quadrant pain: Secondary | ICD-10-CM | POA: Diagnosis not present

## 2015-05-25 DIAGNOSIS — M25551 Pain in right hip: Secondary | ICD-10-CM

## 2015-05-25 NOTE — Therapy (Signed)
[place], [place], [address] Phone: [phone]   Fax:  [phone]  Physical Therapy Treatment  Patient Details  Name: [name] [name] MRN: [id number] Date of Birth: 1960-06-21 Referring Provider: Dr. Lurline Del  Encounter Date: 05/25/2015      PT End of Session - 05/25/15 1631    Visit Number 30  total   Number of Visits 24  for lymphedema   Date for PT Re-Evaluation 06/25/15  for lymphedema   PT Start Time 1018   PT Stop Time 1105   PT Time Calculation (min) 47 min   Activity Tolerance Patient tolerated treatment well   Behavior During Therapy [facility] for tasks assessed/performed      Past Medical History  Diagnosis Date  . Seizures ([place]) 2010    Isolated incident.  Marland Kitchen PONV (postoperative nausea and vomiting)   . Peripheral vascular disease ([place]) 02/2010    blood clot related to porta cath  . S/P radiation therapy 07/17/2014 through 08/02/2014     Left mediastinum, left seventh rib 3250 cGy in 13 sessions   . S/P radiation therapy 12/11/2014 through 12/22/2014     Left parietal calvarium 2400 cGy in 8 sessions   . Breast cancer ([place]) dx'd 2005/2011  . Bone metastases ([place]) dx'd 05/2014    Past Surgical History  Procedure Laterality Date  . Breast lumpectomy  2005  . Axillary lymph node dissection  Dec. 2011  . Portacath placement  12/11  . Removal portacath    . Mediastinotomy chamberlain mcneil Left 06/02/2013    Procedure: MEDIASTINOTOMY CHAMBERLAIN MCNEIL;  Surgeon: [name], MD;  Location: [place];  Service: Thoracic;  Laterality: Left;  LEFT ANTERIOR MEDIASTINOTOMY     There were no vitals filed for this visit.  Visit Diagnosis:  Lymphedema  Pain in joint involving right pelvic region and thigh  Acute back  pain      Subjective Assessment - 05/25/15 1020    Subjective Having a new pain the last couple days at medial border of left scapula; gets worse with turning her neck.   Currently in Pain? Yes   Pain Score 5    Pain Location Groin   Pain Orientation Right   Pain Descriptors / Indicators Sore  "It's more concentrated"   Aggravating Factors  walking   Pain Relieving Factors rest and using cane   Pain Score 6   Pain Location Axilla   Pain Orientation Right   Pain Descriptors / Indicators Other (Comment)  pretty full   Aggravating Factors  more swelling   Pain Relieving Factors less swelling                         [facility] Adult PT Treatment/Exercise - 05/25/15 0001    Manual Therapy   Myofascial Release right axilla with focus at superior scar tightness   Manual Lymphatic Drainage (MLD) In left sidelying, posterior interaxillary anastomosis and right axillo-inguinal anastomosis; in supine, short neck, left axilla and anterior interaxillary anastomosis, right groin and axillo-inguinal anastomosis, area between right breast scars, directing toward pathways.  In right sidelying, left periscapular area toward left groin.   Other Manual Therapy soft tissue work at right thigh in right sidelying with focus on superior and medial aspect.area of pain; very gentle neck distraction in supine to see if that relieved pain at left upper back; soft tissue work between medial aspect of left scapula and spine, with focus  on tight tender point areas all along medial border of scapula                PT Education - 05/24/15 1308    Education provided No          PT Short Term Goals - 02/27/15 1011    PT SHORT TERM GOAL #1   Title pain with walking decreased >/= 25%   Time 3   Period Weeks   Status Achieved   PT SHORT TERM GOAL #2   Title pelvis stays in correct alignment for 3 weeks   Time 3   Period Weeks   Status Achieved           PT Long Term Goals -  05/22/15 1316    PT LONG TERM GOAL #1   Title indpendent with HEP   Time 6   Period Weeks   Status On-going   PT LONG TERM GOAL #2   Title pain with walking decreased >/= 80%   Time 6   Period Weeks   Status On-going   PT LONG TERM GOAL #3   Title go up and down steps with a step over step pattern   Time 6   Period Weeks   Status On-going           Long Term Clinic Goals - 05/25/15 1635    CC Long Term Goal  #1   Title Reports left upper back and left side pain is controlled at 4/10 or less with therapy at the current frequency.   Status On-going   CC Long Term Goal  #2   Title Pt. will report swelling is adequately managed to enable ADL function at a consistent level.   Status On-going            Plan - 05/25/15 1632    Clinical Impression Statement Patient with chronic fluid build-up at right lateral breast area and inferior axilla, which continues to feel full and indurated when she comes in for lymphedema PT sessions and is softer by end of session.  Today patient has new pain at left medial scapular border, and tender points were palpated in the muscles there.  Her right groin pain continues but she describes it as diminishing in breadth.  She came in without her cane today and was limping, but less than recently.   Pt will benefit from skilled therapeutic intervention in order to improve on the following deficits Pain;Abnormal gait;Increased muscle spasms;Increased fascial restricitons;Increased edema   Rehab Potential Good   Clinical Impairments Affecting Rehab Potential active cancer   PT Frequency 2x / week   PT Duration 12 weeks   PT Treatment/Interventions Manual lymph drainage;Manual techniques   PT Next Visit Plan manual lymph drainage, soft tissue work   Consulted and Agree with Plan of Care Patient        Problem List Patient Active Problem List   Diagnosis Date Noted  . 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 Zoster 04/04/2015  . Nausea  with vomiting 11/18/2014  . Constipation 11/18/2014  . Left-sided thoracic back pain   . Bone metastases ([place]) 11/16/2014  . Back pain 11/15/2014  . Uncontrolled pain 11/14/2014  . 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 Chest wall pain 03/21/2014  . Abnormal LFTs (liver function tests) 09/12/2013  . Breast cancer of upper-inner quadrant of right female breast ([place]) 08/18/2013  . 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

## 2015-05-28 ENCOUNTER — Ambulatory Visit: Payer: 59 | Admitting: Physical Therapy

## 2015-05-28 ENCOUNTER — Other Ambulatory Visit (HOSPITAL_BASED_OUTPATIENT_CLINIC_OR_DEPARTMENT_OTHER): Payer: 59

## 2015-05-28 DIAGNOSIS — R1031 Right lower quadrant pain: Secondary | ICD-10-CM | POA: Diagnosis not present

## 2015-05-28 DIAGNOSIS — J90 Pleural effusion, not elsewhere classified: Secondary | ICD-10-CM

## 2015-05-28 DIAGNOSIS — M6289 Other specified disorders of muscle: Secondary | ICD-10-CM

## 2015-05-28 DIAGNOSIS — I890 Lymphedema, not elsewhere classified: Secondary | ICD-10-CM

## 2015-05-28 DIAGNOSIS — C50211 Malignant neoplasm of upper-inner quadrant of right female breast: Secondary | ICD-10-CM

## 2015-05-28 DIAGNOSIS — M25551 Pain in right hip: Secondary | ICD-10-CM

## 2015-05-28 DIAGNOSIS — C771 Secondary and unspecified malignant neoplasm of intrathoracic lymph nodes: Secondary | ICD-10-CM

## 2015-05-28 LAB — CBC WITH DIFFERENTIAL/PLATELET
BASO%: 0.6 % (ref 0.0–2.0)
Basophils Absolute: 0 10*3/uL (ref 0.0–0.1)
EOS ABS: 0.1 10*3/uL (ref 0.0–0.5)
EOS%: 3.1 % (ref 0.0–7.0)
HCT: 36.4 % (ref 34.8–46.6)
HEMOGLOBIN: 12.6 g/dL (ref 11.6–15.9)
LYMPH#: 0.7 10*3/uL — AB (ref 0.9–3.3)
LYMPH%: 20.8 % (ref 14.0–49.7)
MCH: 32.7 pg (ref 25.1–34.0)
MCHC: 34.6 g/dL (ref 31.5–36.0)
MCV: 94.5 fL (ref 79.5–101.0)
MONO#: 0.3 10*3/uL (ref 0.1–0.9)
MONO%: 7.7 % (ref 0.0–14.0)
NEUT%: 67.8 % (ref 38.4–76.8)
NEUTROS ABS: 2.4 10*3/uL (ref 1.5–6.5)
NRBC: 0 % (ref 0–0)
PLATELETS: 337 10*3/uL (ref 145–400)
RBC: 3.85 10*6/uL (ref 3.70–5.45)
RDW: 15.2 % — AB (ref 11.2–14.5)
WBC: 3.5 10*3/uL — AB (ref 3.9–10.3)

## 2015-05-28 NOTE — Therapy (Signed)
[place], [place], [address] Phone: [phone]   Fax:  [phone]  Physical Therapy Treatment  Patient Details  Name: [name] [name] MRN: [id number] Date of Birth: September 24, 1960 Referring Provider: Dr. Lurline Del  Encounter Date: 05/28/2015      PT End of Session - 05/28/15 2151    Visit Number 31  total   Number of Visits 24  for lymphedema   Date for PT Re-Evaluation 06/25/15  lymphedema   PT Start Time 1017   PT Stop Time 1101   PT Time Calculation (min) 44 min   Activity Tolerance Patient tolerated treatment well   Behavior During Therapy [facility] for tasks assessed/performed      Past Medical History  Diagnosis Date  . Seizures ([place]) 2010    Isolated incident.  Marland Kitchen PONV (postoperative nausea and vomiting)   . Peripheral vascular disease ([place]) 02/2010    blood clot related to porta cath  . S/P radiation therapy 07/17/2014 through 08/02/2014     Left mediastinum, left seventh rib 3250 cGy in 13 sessions   . S/P radiation therapy 12/11/2014 through 12/22/2014     Left parietal calvarium 2400 cGy in 8 sessions   . Breast cancer ([place]) dx'd 2005/2011  . Bone metastases ([place]) dx'd 05/2014    Past Surgical History  Procedure Laterality Date  . Breast lumpectomy  2005  . Axillary lymph node dissection  Dec. 2011  . Portacath placement  12/11  . Removal portacath    . Mediastinotomy chamberlain mcneil Left 06/02/2013    Procedure: MEDIASTINOTOMY CHAMBERLAIN MCNEIL;  Surgeon: [name], MD;  Location: [facility];  Service: Thoracic;  Laterality: Left;  LEFT ANTERIOR MEDIASTINOTOMY     There were no vitals filed for this visit.  Visit Diagnosis:  Lymphedema  Pain in joint involving right pelvic region and thigh  Muscle  stiffness      Subjective Assessment - 05/28/15 1020    Subjective The shoulder pain went away.  The rest is just "ditto."   Currently in Pain? No/denies   Pain Score 5    Pain Location Groin   Pain Orientation Right   Pain Descriptors / Indicators Sore   Pain Type Chronic pain   Aggravating Factors  walking   Pain Relieving Factors rest and using cane   Pain Score 6   Pain Location Axilla   Pain Orientation Right   Pain Descriptors / Indicators Other (Comment)   Pain Type Chronic pain   Pain Onset 1 to 4 weeks ago   Pain Frequency Constant   Aggravating Factors  more swelling   Pain Relieving Factors less swelling                         [facility] Adult PT Treatment/Exercise - 05/28/15 0001    Manual Therapy   Myofascial Release right axilla with focus at superior scar tightness; gentle release at right upper medial thigh   Manual Lymphatic Drainage (MLD) In left sidelying, posterior interaxillary anastomosis and right axillo-inguinal anastomosis; in supine, short neck, left axilla and anterior interaxillary anastomosis, right groin and axillo-inguinal anastomosis, area between right breast scars, directing toward pathways, and right upper arm.  In right sidelying, left periscapular area toward left groin.   Other Manual Therapy soft tissue work at superior aspect of right medial thigh in right sidelying for pain relief and muscle relaxation  PT Short Term Goals - 02/27/15 1011    PT SHORT TERM GOAL #1   Title pain with walking decreased >/= 25%   Time 3   Period Weeks   Status Achieved   PT SHORT TERM GOAL #2   Title pelvis stays in correct alignment for 3 weeks   Time 3   Period Weeks   Status Achieved           PT Long Term Goals - 05/22/15 1316    PT LONG TERM GOAL #1   Title indpendent with HEP   Time 6   Period Weeks   Status On-going   PT LONG TERM GOAL #2   Title pain with walking decreased >/= 80%   Time 6    Period Weeks   Status On-going   PT LONG TERM GOAL #3   Title go up and down steps with a step over step pattern   Time 6   Period Weeks   Status On-going           Long Term Clinic Goals - 05/25/15 1635    CC Long Term Goal  #1   Title Reports left upper back and left side pain is controlled at 4/10 or less with therapy at the current frequency.   Status On-going   CC Long Term Goal  #2   Title Pt. will report swelling is adequately managed to enable ADL function at a consistent level.   Status On-going            Plan - 05/28/15 2152    Clinical Impression Statement patient's left upper back pain was improved today; she reported the right groin pain was helped with myofascial release done at end of session today   Pt will benefit from skilled therapeutic intervention in order to improve on the following deficits Pain;Abnormal gait;Increased muscle spasms;Increased fascial restricitons;Increased edema   Rehab Potential Good   Clinical Impairments Affecting Rehab Potential active cancer   PT Frequency 2x / week   PT Duration 12 weeks   PT Treatment/Interventions Manual lymph drainage;Manual techniques   PT Next Visit Plan Remeasure circumference of chest;continue manual therapy   Consulted and Agree with Plan of Care Patient        Problem List Patient Active Problem List   Diagnosis Date Noted  . 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 Zoster 04/04/2015  . Nausea with vomiting 11/18/2014  . Constipation 11/18/2014  . Left-sided thoracic back pain   . Bone metastases ([place]) 11/16/2014  . Back pain 11/15/2014  . Uncontrolled pain 11/14/2014  . 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 Chest wall pain 03/21/2014  . Abnormal LFTs (liver function tests) 09/12/2013  . Breast cancer of upper-inner quadrant of right female breast ([place]) 08/18/2013  . 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

## 2015-05-29 ENCOUNTER — Encounter: Payer: Self-pay | Admitting: Physical Therapy

## 2015-05-29 ENCOUNTER — Ambulatory Visit: Payer: 59 | Admitting: Physical Therapy

## 2015-05-29 DIAGNOSIS — R1031 Right lower quadrant pain: Secondary | ICD-10-CM | POA: Diagnosis not present

## 2015-05-29 DIAGNOSIS — M6289 Other specified disorders of muscle: Secondary | ICD-10-CM

## 2015-05-29 DIAGNOSIS — M25551 Pain in right hip: Secondary | ICD-10-CM

## 2015-05-29 NOTE — Therapy (Signed)
[facility] [address], [place], [place], [address] Phone: [phone]   Fax:  [phone]  Physical Therapy Treatment  Patient Details  Name: [name] [name] MRN: [id number] Date of Birth: 1961/01/04 Referring Provider: Dr. Lurline Del  Encounter Date: 05/29/2015      PT End of Session - 05/29/15 1153    Visit Number 32  pelvis   Date for PT Re-Evaluation 06/25/15  pelvis   Authorization - Visit Number 69   Authorization - Number of Visits 32   PT Start Time 1150   PT Stop Time 1228   PT Time Calculation (min) 38 min   Activity Tolerance Patient tolerated treatment well   Behavior During Therapy [facility] for tasks assessed/performed      Past Medical History  Diagnosis Date  . Seizures ([place]) 2010    Isolated incident.  Marland Kitchen PONV (postoperative nausea and vomiting)   . Peripheral vascular disease ([place]) 02/2010    blood clot related to porta cath  . S/P radiation therapy 07/17/2014 through 08/02/2014     Left mediastinum, left seventh rib 3250 cGy in 13 sessions   . S/P radiation therapy 12/11/2014 through 12/22/2014     Left parietal calvarium 2400 cGy in 8 sessions   . Breast cancer ([place]) dx'd 2005/2011  . Bone metastases ([place]) dx'd 05/2014    Past Surgical History  Procedure Laterality Date  . Breast lumpectomy  2005  . Axillary lymph node dissection  Dec. 2011  . Portacath placement  12/11  . Removal portacath    . Mediastinotomy chamberlain mcneil Left 06/02/2013    Procedure: MEDIASTINOTOMY CHAMBERLAIN MCNEIL;  Surgeon: [name], MD;  Location: [facility];  Service: Thoracic;  Laterality: Left;  LEFT ANTERIOR MEDIASTINOTOMY     There were no vitals filed for this visit.  Visit Diagnosis:  Pain in joint involving right pelvic region and  thigh  Muscle stiffness  Groin pain, right      Subjective Assessment - 05/29/15 1150    Subjective Butch Penny worked on the inner thigh.  I have less pain. If I can rotate my foot to correct alignment and feel a pull in righ tineer thigh. Patient has been walking at a faster pace.    Pertinent History patient vomited just prior to treatment today   How long can you walk comfortably? make patient limp   Diagnostic tests x rays of back show no fractures;    Patient Stated Goals walk 20 minutes with minimal limp   Currently in Pain? Yes   Pain Score 5    Pain Location Groin   Pain Orientation Right   Pain Descriptors / Indicators Sore   Pain Type Chronic pain   Pain Onset More than a month ago   Pain Frequency Constant   Aggravating Factors  walking   Pain Relieving Factors  rest and using cane   Effect of Pain on Daily Activities walk with a limp                      Pelvic Floor Special Questions - 05/29/15 0001    Pelvic Floor Internal Exam Patient approves physical therapist to perform pelvic floor muscle assessment   Exam Type Vaginal           [facility] Adult PT Treatment/Exercise - 05/29/15 0001    Manual Therapy   Manual Therapy Soft tissue mobilization;Internal Pelvic Floor   Internal Pelvic Floor bil. sides of obturator  internist, bil. levaot ani with one hand internally and one hand externally                  PT Short Term Goals - 02/27/15 1011    PT SHORT TERM GOAL #1   Title pain with walking decreased >/= 25%   Time 3   Period Weeks   Status Achieved   PT SHORT TERM GOAL #2   Title pelvis stays in correct alignment for 3 weeks   Time 3   Period Weeks   Status Achieved           PT Long Term Goals - 05/22/15 1316    PT LONG TERM GOAL #1   Title indpendent with HEP   Time 6   Period Weeks   Status On-going   PT LONG TERM GOAL #2   Title pain with walking decreased >/= 80%   Time 6   Period Weeks   Status On-going   PT  LONG TERM GOAL #3   Title go up and down steps with a step over step pattern   Time 6   Period Weeks   Status On-going           Long Term Clinic Goals - 05/25/15 1635    CC Long Term Goal  #1   Title Reports left upper back and left side pain is controlled at 4/10 or less with therapy at the current frequency.   Status On-going   CC Long Term Goal  #2   Title Pt. will report swelling is adequately managed to enable ADL function at a consistent level.   Status On-going            Plan - 05/29/15 1225    Clinical Impression Statement Patient has 50% decresae in pain after therapy and able to stand on one foot without the shooting pain she would get.  Patient able to walk without a limp. Patient had trigger points on bil. sides of pelvic floor.    Pt will benefit from skilled therapeutic intervention in order to improve on the following deficits Pain;Abnormal gait;Increased muscle spasms;Increased fascial restricitons;Increased edema   Rehab Potential Good   Clinical Impairments Affecting Rehab Potential active cancer   PT Frequency 2x / week   PT Duration 8 weeks   PT Treatment/Interventions Manual lymph drainage;Manual techniques   PT Next Visit Plan Remeasure circumference of chest;continue manual therapy; manual therapy to pelvic floor   PT Home Exercise Plan progress as needed   Consulted and Agree with Plan of Care Patient   PT Plan Continue manual lymph drainage, soft tissue work.        Problem List Patient Active Problem List   Diagnosis Date Noted  . 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 Zoster 04/04/2015  . Nausea with vomiting 11/18/2014  . Constipation 11/18/2014  . Left-sided thoracic back pain   . Bone metastases ([place]) 11/16/2014  . Back pain 11/15/2014  . Uncontrolled pain 11/14/2014  . 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 Chest wall pain 03/21/2014  . Abnormal LFTs (liver function tests) 09/12/2013  . Breast cancer of  upper-inner quadrant of right female breast ([place]) 08/18/2013  . 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 [place], [place], [address] Phone: [phone]   Fax:  [phone]  Name: [name] [name] MRN: [id number] Date  of Birth: 05/27/1960

## 2015-05-30 ENCOUNTER — Ambulatory Visit (HOSPITAL_BASED_OUTPATIENT_CLINIC_OR_DEPARTMENT_OTHER): Payer: 59

## 2015-05-30 ENCOUNTER — Ambulatory Visit (HOSPITAL_COMMUNITY)
Admission: RE | Admit: 2015-05-30 | Discharge: 2015-05-30 | Disposition: A | Discharge status: Home / Self Care | Payer: 59 | Source: Ambulatory Visit | Attending: Oncology | Admitting: Oncology

## 2015-05-30 ENCOUNTER — Ambulatory Visit (HOSPITAL_COMMUNITY): Payer: 59

## 2015-05-30 DIAGNOSIS — C7951 Secondary malignant neoplasm of bone: Secondary | ICD-10-CM | POA: Diagnosis present

## 2015-05-30 DIAGNOSIS — C771 Secondary and unspecified malignant neoplasm of intrathoracic lymph nodes: Secondary | ICD-10-CM | POA: Diagnosis not present

## 2015-05-30 DIAGNOSIS — C782 Secondary malignant neoplasm of pleura: Secondary | ICD-10-CM | POA: Insufficient documentation

## 2015-05-30 DIAGNOSIS — M84454A Pathological fracture, pelvis, initial encounter for fracture: Secondary | ICD-10-CM | POA: Insufficient documentation

## 2015-05-30 DIAGNOSIS — K8020 Calculus of gallbladder without cholecystitis without obstruction: Secondary | ICD-10-CM | POA: Insufficient documentation

## 2015-05-30 DIAGNOSIS — C50211 Malignant neoplasm of upper-inner quadrant of right female breast: Secondary | ICD-10-CM

## 2015-05-30 DIAGNOSIS — C787 Secondary malignant neoplasm of liver and intrahepatic bile duct: Secondary | ICD-10-CM | POA: Diagnosis not present

## 2015-05-30 MED ORDER — GADOBENATE DIMEGLUMINE 529 MG/ML IV SOLN
15.0000 mL | Freq: Once | INTRAVENOUS | Status: AC | PRN
  Administered 2015-05-30: 14 mL via INTRAVENOUS

## 2015-05-31 ENCOUNTER — Ambulatory Visit: Payer: 59 | Attending: Oncology | Admitting: Physical Therapy

## 2015-05-31 ENCOUNTER — Other Ambulatory Visit: Payer: Self-pay | Admitting: *Deleted

## 2015-05-31 ENCOUNTER — Encounter: Payer: Self-pay | Admitting: Physical Therapy

## 2015-05-31 DIAGNOSIS — M25551 Pain in right hip: Secondary | ICD-10-CM | POA: Diagnosis present

## 2015-05-31 DIAGNOSIS — G729 Myopathy, unspecified: Secondary | ICD-10-CM | POA: Diagnosis present

## 2015-05-31 DIAGNOSIS — M6289 Other specified disorders of muscle: Secondary | ICD-10-CM

## 2015-05-31 DIAGNOSIS — R1031 Right lower quadrant pain: Secondary | ICD-10-CM | POA: Diagnosis not present

## 2015-05-31 NOTE — Therapy (Signed)
[facility] [address] [place], [place], [address] Phone: [phone]   Fax:  [phone]  Physical Therapy Treatment  Patient Details  Name: [name] [name] MRN: [id number] Date of Birth: 06-07-1960 Referring Provider: Dr. Lurline Del  Encounter Date: 05/31/2015      PT End of Session - 05/31/15 1233    Visit Number 33  pelvis   Date for PT Re-Evaluation 06/25/15  pelvis   Authorization - Visit Number 48   Authorization - Number of Visits 33   PT Start Time 1230   PT Stop Time 1308   PT Time Calculation (min) 38 min   Activity Tolerance Patient tolerated treatment well   Behavior During Therapy [facility] for tasks assessed/performed      Past Medical History  Diagnosis Date  . Seizures ([place]) 2010    Isolated incident.  Marland Kitchen PONV (postoperative nausea and vomiting)   . Peripheral vascular disease ([place]) 02/2010    blood clot related to porta cath  . S/P radiation therapy 07/17/2014 through 08/02/2014     Left mediastinum, left seventh rib 3250 cGy in 13 sessions   . S/P radiation therapy 12/11/2014 through 12/22/2014     Left parietal calvarium 2400 cGy in 8 sessions   . Breast cancer ([place]) dx'd 2005/2011  . Bone metastases ([place]) dx'd 05/2014    Past Surgical History  Procedure Laterality Date  . Breast lumpectomy  2005  . Axillary lymph node dissection  Dec. 2011  . Portacath placement  12/11  . Removal portacath    . Mediastinotomy chamberlain mcneil Left 06/02/2013    Procedure: MEDIASTINOTOMY CHAMBERLAIN MCNEIL;  Surgeon: [name], MD;  Location: [facility];  Service: Thoracic;  Laterality: Left;  LEFT ANTERIOR MEDIASTINOTOMY     There were no vitals filed for this visit.  Visit Diagnosis:  Groin pain, right  Muscle stiffness  Pain in  joint involving right pelvic region and thigh      Subjective Assessment - 05/31/15 1234    Subjective I feel 10% better from last Tuesday. i am able to walk a little more.    How long can you walk comfortably? make patient limp   Diagnostic tests x rays of back show no fractures;    Patient Stated Goals walk 20 minutes with minimal limp   Currently in Pain? Yes   Pain Score 6    Pain Location Groin   Pain Orientation Right   Pain Descriptors / Indicators Sore   Pain Type Chronic pain   Pain Onset More than a month ago   Pain Frequency Constant   Aggravating Factors  walking    Pain Relieving Factors rest and using cane   Effect of Pain on Daily Activities walk with a limp   Multiple Pain Sites No                      Pelvic Floor Special Questions - 05/31/15 0001    Pelvic Floor Internal Exam Patient approves physical therapist to perform pelvic floor muscle assessment   Exam Type Vaginal           [facility] Adult PT Treatment/Exercise - 05/31/15 0001    Manual Therapy   Manual Therapy Internal Pelvic Floor   Internal Pelvic Floor bil. levaotr ani and obturator internist with another hand on the external portion                 PT Education -  05/31/15 1311    Education provided No          PT Short Term Goals - 02/27/15 1011    PT SHORT TERM GOAL #1   Title pain with walking decreased >/= 25%   Time 3   Period Weeks   Status Achieved   PT SHORT TERM GOAL #2   Title pelvis stays in correct alignment for 3 weeks   Time 3   Period Weeks   Status Achieved           PT Long Term Goals - 05/22/15 1316    PT LONG TERM GOAL #1   Title indpendent with HEP   Time 6   Period Weeks   Status On-going   PT LONG TERM GOAL #2   Title pain with walking decreased >/= 80%   Time 6   Period Weeks   Status On-going   PT LONG TERM GOAL #3   Title go up and down steps with a step over step pattern   Time 6   Period Weeks   Status On-going            Long Term Clinic Goals - 05/25/15 1635    CC Long Term Goal  #1   Title Reports left upper back and left side pain is controlled at 4/10 or less with therapy at the current frequency.   Status On-going   CC Long Term Goal  #2   Title Pt. will report swelling is adequately managed to enable ADL function at a consistent level.   Status On-going            Plan - 05/31/15 1312    Clinical Impression Statement Patient is able to ambulate more with no cane. Patient able to ambulate at a faster pace with limp reduced by 75% after therapy. Patient has reduced tenderness with palpation.    Pt will benefit from skilled therapeutic intervention in order to improve on the following deficits Pain;Abnormal gait;Increased muscle spasms;Increased fascial restricitons;Increased edema   Rehab Potential Good   Clinical Impairments Affecting Rehab Potential active cancer   PT Frequency 2x / week   PT Duration 8 weeks   PT Treatment/Interventions Manual techniques;Patient/family education   PT Next Visit Plan soft tissue work   PT Home Exercise Plan progress as needed   Consulted and Agree with Plan of Care Patient   PT Plan Continue manual lymph drainage, soft tissue work.        Problem List Patient Active Problem List   Diagnosis Date Noted  . 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 Zoster 04/04/2015  . Nausea with vomiting 11/18/2014  . Constipation 11/18/2014  . Left-sided thoracic back pain   . Bone metastases ([place]) 11/16/2014  . Back pain 11/15/2014  . Uncontrolled pain 11/14/2014  . 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 Chest wall pain 03/21/2014  . Abnormal LFTs (liver function tests) 09/12/2013  . Breast cancer of upper-inner quadrant of right female breast ([place]) 08/18/2013  . 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 [place], [place], [address] Phone: [phone]   Fax:  [phone]  Name: [name] [name] MRN: [id number] Date of Birth: 1960/11/20

## 2015-06-01 ENCOUNTER — Other Ambulatory Visit: Payer: Self-pay | Admitting: *Deleted

## 2015-06-01 ENCOUNTER — Other Ambulatory Visit: Payer: Self-pay | Admitting: Oncology

## 2015-06-01 ENCOUNTER — Ambulatory Visit: Payer: 59 | Attending: Oncology | Admitting: Physical Therapy

## 2015-06-01 ENCOUNTER — Ambulatory Visit (HOSPITAL_COMMUNITY)
Admission: RE | Admit: 2015-06-01 | Discharge: 2015-06-01 | Disposition: A | Discharge status: Home / Self Care | Payer: 59 | Source: Ambulatory Visit | Attending: Oncology | Admitting: Oncology

## 2015-06-01 DIAGNOSIS — R918 Other nonspecific abnormal finding of lung field: Secondary | ICD-10-CM | POA: Diagnosis not present

## 2015-06-01 DIAGNOSIS — R0789 Other chest pain: Secondary | ICD-10-CM

## 2015-06-01 DIAGNOSIS — C50211 Malignant neoplasm of upper-inner quadrant of right female breast: Secondary | ICD-10-CM | POA: Diagnosis not present

## 2015-06-01 DIAGNOSIS — M549 Dorsalgia, unspecified: Secondary | ICD-10-CM | POA: Insufficient documentation

## 2015-06-01 DIAGNOSIS — G729 Myopathy, unspecified: Secondary | ICD-10-CM | POA: Diagnosis present

## 2015-06-01 DIAGNOSIS — M545 Low back pain: Secondary | ICD-10-CM | POA: Insufficient documentation

## 2015-06-01 DIAGNOSIS — M25551 Pain in right hip: Secondary | ICD-10-CM | POA: Diagnosis present

## 2015-06-01 DIAGNOSIS — J90 Pleural effusion, not elsewhere classified: Secondary | ICD-10-CM | POA: Insufficient documentation

## 2015-06-01 DIAGNOSIS — M79651 Pain in right thigh: Secondary | ICD-10-CM | POA: Insufficient documentation

## 2015-06-01 DIAGNOSIS — I890 Lymphedema, not elsewhere classified: Secondary | ICD-10-CM

## 2015-06-01 NOTE — Therapy (Signed)
[place], [place], [address] Phone: [phone]   Fax:  [phone]  Physical Therapy Treatment  Patient Details  Name: [name] [name] MRN: [id number] Date of Birth: [birth year] Referring Provider: Dr. Lurline Del  Encounter Date: 06/01/2015      PT End of Session - 06/01/15 1209    Visit Number 34  total   Number of Visits 24  for lymphedema   Date for PT Re-Evaluation 06/25/15   Authorization - Visit Number 140   PT Start Time 1017   PT Stop Time 1107   PT Time Calculation (min) 50 min   Activity Tolerance Patient tolerated treatment well   Behavior During Therapy [facility] for tasks assessed/performed      Past Medical History  Diagnosis Date  . Seizures ([place]) 2010    Isolated incident.  Marland Kitchen PONV (postoperative nausea and vomiting)   . Peripheral vascular disease ([place]) 02/2010    blood clot related to porta cath  . S/P radiation therapy 07/17/2014 through 08/02/2014     Left mediastinum, left seventh rib 3250 cGy in 13 sessions   . S/P radiation therapy 12/11/2014 through 12/22/2014     Left parietal calvarium 2400 cGy in 8 sessions   . Breast cancer ([place]) dx'd 2005/2011  . Bone metastases ([place]) dx'd 05/2014    Past Surgical History  Procedure Laterality Date  . Breast lumpectomy  2005  . Axillary lymph node dissection  Dec. 2011  . Portacath placement  12/11  . Removal portacath    . Mediastinotomy chamberlain mcneil Left 06/02/2013    Procedure: MEDIASTINOTOMY CHAMBERLAIN MCNEIL;  Surgeon: [name], MD;  Location: [place];  Service: Thoracic;  Laterality: Left;  LEFT ANTERIOR MEDIASTINOTOMY     There were no vitals filed for this visit.  Visit Diagnosis:  Acute back pain  Lymphedema  Pain in joint involving right  pelvic region and thigh      Subjective Assessment - 06/01/15 1020    Subjective Got 14 PT visits approved for this year.  Had increased left mid-back pain last night.  Leg is better.   Currently in Pain? Yes   Pain Score 6    Pain Location Back   Pain Orientation Posterior;Mid   Pain Descriptors / Indicators Tightness;Other (Comment);Tender  fullness   Aggravating Factors  building the past three days; possibly worse from lying still in the MRI for two hours on Wednesday.   Pain Relieving Factors unsure   Pain Score 6   Pain Location Axilla   Pain Orientation Right   Pain Type Chronic pain   Pain Frequency Constant   Aggravating Factors  more swelling   Pain Relieving Factors less swelling                         [facility] Adult PT Treatment/Exercise - 06/01/15 0001    Self-Care   Self-Care Other Self-Care Comments   Other Self-Care Comments  Discussed possible use of compressive garments for right axilla and trunk; patient has a sports bra that covers right upper flank better than bra she wears today, but this is uncomfortable for her.  A Spanx-type compressive camisole is out of the question due to significant hot flashes that she has.   Manual Therapy   Myofascial Release right axilla with focus at superior scar tightness; gentle release at right upper medial thigh   Manual Lymphatic Drainage (MLD) In left sidelying, posterior interaxillary  anastomosis and right axillo-inguinal anastomosis; in supine, short neck, left axilla and anterior interaxillary anastomosis, right groin and axillo-inguinal anastomosis, area between right breast scars, directing toward pathways.  In right sidelying, left periscapular area toward left groin.  Extra focus on left back area today with more time spent here directing fluid towards left groin.   Other Manual Therapy soft tissue work at superior aspect of right medial thigh in right sidelying for pain relief and muscle relaxation                 PT Education - 05/31/15 1311    Education provided No          PT Short Term Goals - 02/27/15 1011    PT SHORT TERM GOAL #1   Title pain with walking decreased >/= 25%   Time 3   Period Weeks   Status Achieved   PT SHORT TERM GOAL #2   Title pelvis stays in correct alignment for 3 weeks   Time 3   Period Weeks   Status Achieved           PT Long Term Goals - 05/22/15 1316    PT LONG TERM GOAL #1   Title indpendent with HEP   Time 6   Period Weeks   Status On-going   PT LONG TERM GOAL #2   Title pain with walking decreased >/= 80%   Time 6   Period Weeks   Status On-going   PT LONG TERM GOAL #3   Title go up and down steps with a step over step pattern   Time 6   Period Weeks   Status On-going           Long Term Clinic Goals - 06/01/15 1212    CC Long Term Goal  #1   Title Reports left upper back and left side pain is controlled at 4/10 or less with therapy at the current frequency.   Baseline Difficult to control recently, with it staying in 5-6/10 range.   Status On-going   CC Long Term Goal  #2   Title Pt. will report swelling is adequately managed to enable ADL function at a consistent level.   Status On-going            Plan - 06/01/15 1210    Clinical Impression Statement Continuing to have fullness at right axilla that benefits from manual treatment and she cannot tolerate the pump right now.  Today with increased left back pain that she felt might be from some increased fullness there, so more time was spent on that area and trying to drain towards left groin.   Pt will benefit from skilled therapeutic intervention in order to improve on the following deficits Pain;Abnormal gait;Increased muscle spasms;Increased fascial restricitons;Increased edema   Rehab Potential Good   Clinical Impairments Affecting Rehab Potential active cancer   PT Frequency 2x / week   PT Duration 12 weeks   PT Treatment/Interventions Manual  techniques;Manual lymph drainage;ADLs/Self Care Home Management   PT Next Visit Plan manual lymph drainage, soft tissue work   Consulted and Agree with Plan of Care Patient        Problem List Patient Active Problem List   Diagnosis Date Noted  . 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 Zoster 04/04/2015  . Nausea with vomiting 11/18/2014  . Constipation 11/18/2014  . Left-sided thoracic back pain   . Bone metastases ([place]) 11/16/2014  . Back pain 11/15/2014  . Uncontrolled  pain 11/14/2014  . 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 Chest wall pain 03/21/2014  . Abnormal LFTs (liver function tests) 09/12/2013  . Breast cancer of upper-inner quadrant of right female breast ([place]) 08/18/2013  . 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

## 2015-06-03 ENCOUNTER — Other Ambulatory Visit: Payer: Self-pay | Admitting: Oncology

## 2015-06-03 NOTE — Progress Notes (Unsigned)
[name] [name] stopped in today for an unscheduled visit. She is concerned that the new scan is reading the pelvic lesion is a fracture. I think this is clinically very consistent. She understands Dr. Tammi Klippel to have said there is no fracture there. We are waiting for Dr. Johney Maine reading. We should have that available by the time she sees me again 06/06/2015.

## 2015-06-04 ENCOUNTER — Ambulatory Visit: Payer: 59 | Admitting: Physical Therapy

## 2015-06-04 ENCOUNTER — Other Ambulatory Visit (HOSPITAL_BASED_OUTPATIENT_CLINIC_OR_DEPARTMENT_OTHER): Payer: 59

## 2015-06-04 DIAGNOSIS — C50211 Malignant neoplasm of upper-inner quadrant of right female breast: Secondary | ICD-10-CM

## 2015-06-04 DIAGNOSIS — I890 Lymphedema, not elsewhere classified: Secondary | ICD-10-CM

## 2015-06-04 DIAGNOSIS — C771 Secondary and unspecified malignant neoplasm of intrathoracic lymph nodes: Secondary | ICD-10-CM

## 2015-06-04 DIAGNOSIS — J90 Pleural effusion, not elsewhere classified: Secondary | ICD-10-CM

## 2015-06-04 DIAGNOSIS — M6289 Other specified disorders of muscle: Secondary | ICD-10-CM

## 2015-06-04 DIAGNOSIS — M25551 Pain in right hip: Secondary | ICD-10-CM

## 2015-06-04 DIAGNOSIS — M549 Dorsalgia, unspecified: Secondary | ICD-10-CM | POA: Diagnosis not present

## 2015-06-04 LAB — COMPREHENSIVE METABOLIC PANEL WITH GFR
ALT: 33 U/L (ref 0–55)
AST: 24 U/L (ref 5–34)
Albumin: 3.9 g/dL (ref 3.5–5.0)
Alkaline Phosphatase: 93 U/L (ref 40–150)
Anion Gap: 9 meq/L (ref 3–11)
BUN: 10.4 mg/dL (ref 7.0–26.0)
CO2: 26 meq/L (ref 22–29)
Calcium: 9.5 mg/dL (ref 8.4–10.4)
Chloride: 103 meq/L (ref 98–109)
Creatinine: 0.8 mg/dL (ref 0.6–1.1)
EGFR: 85 ml/min/1.73 m2 — ABNORMAL LOW
Glucose: 77 mg/dL (ref 70–140)
Potassium: 4 meq/L (ref 3.5–5.1)
Sodium: 138 meq/L (ref 136–145)
Total Bilirubin: 0.31 mg/dL (ref 0.20–1.20)
Total Protein: 7.4 g/dL (ref 6.4–8.3)

## 2015-06-04 LAB — CBC WITH DIFFERENTIAL/PLATELET
BASO%: 1.2 % (ref 0.0–2.0)
Basophils Absolute: 0 10e3/uL (ref 0.0–0.1)
EOS%: 2.1 % (ref 0.0–7.0)
Eosinophils Absolute: 0.1 10e3/uL (ref 0.0–0.5)
HCT: 38.2 % (ref 34.8–46.6)
HGB: 12.9 g/dL (ref 11.6–15.9)
LYMPH%: 19.8 % (ref 14.0–49.7)
MCH: 32.4 pg (ref 25.1–34.0)
MCHC: 33.8 g/dL (ref 31.5–36.0)
MCV: 96 fL (ref 79.5–101.0)
MONO#: 0.3 10e3/uL (ref 0.1–0.9)
MONO%: 8.9 % (ref 0.0–14.0)
NEUT#: 2.4 10e3/uL (ref 1.5–6.5)
NEUT%: 68 % (ref 38.4–76.8)
Platelets: 332 10e3/uL (ref 145–400)
RBC: 3.98 10e6/uL (ref 3.70–5.45)
RDW: 16.6 % — ABNORMAL HIGH (ref 11.2–14.5)
WBC: 3.5 10e3/uL — ABNORMAL LOW (ref 3.9–10.3)
lymph#: 0.7 10e3/uL — ABNORMAL LOW (ref 0.9–3.3)

## 2015-06-04 NOTE — Therapy (Signed)
[place], [place], [address] Phone: [phone]   Fax:  [phone]  Physical Therapy Treatment  Patient Details  Name: [name] [name] MRN: [id number] Date of Birth: 08/11/1960 Referring Provider: Dr. Lurline Del  Encounter Date: 06/04/2015      PT End of Session - 06/04/15 1252    Visit Number 35  total   Number of Visits 24  for lymph   Date for PT Re-Evaluation 06/25/15   Authorization - Visit Number 140   PT Start Time 1020   PT Stop Time 1102   PT Time Calculation (min) 42 min   Activity Tolerance Patient tolerated treatment well   Behavior During Therapy [facility] for tasks assessed/performed      Past Medical History  Diagnosis Date  . Seizures ([place]) 2010    Isolated incident.  Marland Kitchen PONV (postoperative nausea and vomiting)   . Peripheral vascular disease ([place]) 02/2010    blood clot related to porta cath  . S/P radiation therapy 07/17/2014 through 08/02/2014     Left mediastinum, left seventh rib 3250 cGy in 13 sessions   . S/P radiation therapy 12/11/2014 through 12/22/2014     Left parietal calvarium 2400 cGy in 8 sessions   . Breast cancer ([place]) dx'd 2005/2011  . Bone metastases ([place]) dx'd 05/2014    Past Surgical History  Procedure Laterality Date  . Breast lumpectomy  2005  . Axillary lymph node dissection  Dec. 2011  . Portacath placement  12/11  . Removal portacath    . Mediastinotomy chamberlain mcneil Left 06/02/2013    Procedure: MEDIASTINOTOMY CHAMBERLAIN MCNEIL;  Surgeon: [name], MD;  Location: [place];  Service: Thoracic;  Laterality: Left;  LEFT ANTERIOR MEDIASTINOTOMY     There were no vitals filed for this visit.  Visit Diagnosis:  Lymphedema  Pain in joint involving right pelvic region and  thigh  Muscle stiffness      Subjective Assessment - 06/04/15 1021    Subjective Has a fracture at the pubic ramus; scan was read and "lesions are better recorded now, but the same number and size."  X-ray didn't show more fluid on the lung.  Left back pain is gone.   Currently in Pain? Yes   Pain Score 3    Pain Location Groin   Pain Orientation Right   Pain Descriptors / Indicators Other (Comment)  pain   Aggravating Factors  moving a certain way   Pain Relieving Factors using cane   Pain Score 7   Pain Location Axilla   Pain Orientation Right   Pain Descriptors / Indicators Other (Comment)  "really full, hard as a rock"   Pain Type Chronic pain   Aggravating Factors  more swelling   Pain Relieving Factors less swelling                         [facility] Adult PT Treatment/Exercise - 06/04/15 0001    Manual Therapy   Myofascial Release right axilla with focus at superior scar tightness; gentle release at right upper medial thigh   Manual Lymphatic Drainage (MLD) In left sidelying, posterior interaxillary anastomosis and right axillo-inguinal anastomosis; in supine, short neck, left axilla and anterior interaxillary anastomosis, right groin and axillo-inguinal anastomosis, area between right breast scars, directing toward pathways, and right upper arm.  In right sidelying, left periscapular area toward left groin.   Other Manual Therapy soft tissue work at superior aspect  of right medial thigh in right sidelying for pain relief and muscle relaxation                  PT Short Term Goals - 02/27/15 1011    PT SHORT TERM GOAL #1   Title pain with walking decreased >/= 25%   Time 3   Period Weeks   Status Achieved   PT SHORT TERM GOAL #2   Title pelvis stays in correct alignment for 3 weeks   Time 3   Period Weeks   Status Achieved           PT Long Term Goals - 05/22/15 1316    PT LONG TERM GOAL #1   Title indpendent with HEP   Time 6    Period Weeks   Status On-going   PT LONG TERM GOAL #2   Title pain with walking decreased >/= 80%   Time 6   Period Weeks   Status On-going   PT LONG TERM GOAL #3   Title go up and down steps with a step over step pattern   Time 6   Period Weeks   Status On-going           Long Term Clinic Goals - 06/01/15 1212    CC Long Term Goal  #1   Title Reports left upper back and left side pain is controlled at 4/10 or less with therapy at the current frequency.   Baseline Difficult to control recently, with it staying in 5-6/10 range.   Status On-going   CC Long Term Goal  #2   Title Pt. will report swelling is adequately managed to enable ADL function at a consistent level.   Status On-going            Plan - 06/04/15 1252    Clinical Impression Statement Patient more upbeat today, with fracture as an explanation for her groin pain and scan indicating some stabilization of metastases.  She comes in walking with a cane.  Right lateral breast/axilla area is fuller than previously, however, and more uncomfortable.  Increased fullness is palpable, and decreased after manual lymph drainage.   Pt will benefit from skilled therapeutic intervention in order to improve on the following deficits Pain;Abnormal gait;Increased muscle spasms;Increased fascial restricitons;Increased edema   Rehab Potential Good   Clinical Impairments Affecting Rehab Potential active cancer   PT Frequency 2x / week   PT Duration 12 weeks   PT Treatment/Interventions Manual techniques;Manual lymph drainage;ADLs/Self Care Home Management   PT Next Visit Plan remeasure chest circumference, manual lymph drainage, soft tissue work   Consulted and Agree with Plan of Care Patient        Problem List Patient Active Problem List   Diagnosis Date Noted  . 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 Zoster 04/04/2015  . Nausea with vomiting 11/18/2014  . Constipation 11/18/2014  . Left-sided thoracic back  pain   . Bone metastases ([place]) 11/16/2014  . Back pain 11/15/2014  . Uncontrolled pain 11/14/2014  . 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 Chest wall pain 03/21/2014  . Abnormal LFTs (liver function tests) 09/12/2013  . Breast cancer of upper-inner quadrant of right female breast ([place]) 08/18/2013  . 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

## 2015-06-05 ENCOUNTER — Encounter: Payer: Self-pay | Admitting: Physical Therapy

## 2015-06-05 ENCOUNTER — Ambulatory Visit: Payer: 59 | Admitting: Physical Therapy

## 2015-06-05 DIAGNOSIS — R1031 Right lower quadrant pain: Secondary | ICD-10-CM

## 2015-06-05 NOTE — Therapy (Signed)
[facility] [address], [place], [place], [address] Phone: [phone]   Fax:  [phone]  Physical Therapy Treatment  Patient Details  Name: [name] [name] MRN: [id number] Date of Birth: [birth year] Referring Provider: Dr. Lurline Del  Encounter Date: 06/05/2015      PT End of Session - 06/05/15 1307    Visit Number 36  pelvis   Date for PT Re-Evaluation 06/25/15   Authorization - Visit Number 140   Authorization - Number of Visits 36   PT Start Time 1230   PT Stop Time 1300   PT Time Calculation (min) 30 min   Activity Tolerance Patient tolerated treatment well   Behavior During Therapy [facility] for tasks assessed/performed      Past Medical History  Diagnosis Date  . Seizures ([place]) 2010    Isolated incident.  Marland Kitchen PONV (postoperative nausea and vomiting)   . Peripheral vascular disease ([place]) 02/2010    blood clot related to porta cath  . S/P radiation therapy 07/17/2014 through 08/02/2014     Left mediastinum, left seventh rib 3250 cGy in 13 sessions   . S/P radiation therapy 12/11/2014 through 12/22/2014     Left parietal calvarium 2400 cGy in 8 sessions   . Breast cancer ([place]) dx'd 2005/2011  . Bone metastases ([employer]) dx'd 05/2014    Past Surgical History  Procedure Laterality Date  . Breast lumpectomy  2005  . Axillary lymph node dissection  Dec. 2011  . Portacath placement  12/11  . Removal portacath    . Mediastinotomy chamberlain mcneil Left 06/02/2013    Procedure: MEDIASTINOTOMY CHAMBERLAIN MCNEIL;  Surgeon: [name], MD;  Location: [facility];  Service: Thoracic;  Laterality: Left;  LEFT ANTERIOR MEDIASTINOTOMY     There were no vitals filed for this visit.  Visit Diagnosis:  Groin pain, right      Subjective Assessment -  06/05/15 1231    Subjective I have a fracture in inferior rami, leisions on sacrum, ilium, pubic rami, left ishial tuberosity.  I had increased pain in right hip with increased limp and had to use the cane.    How long can you walk comfortably? make patient limp   Diagnostic tests x rays of back show no fractures;    Patient Stated Goals walk 20 minutes with minimal limp   Currently in Pain? Yes   Pain Score 7   laying 3-4/10   Pain Location Groin   Pain Orientation Right   Pain Descriptors / Indicators Burning   Pain Type Chronic pain   Pain Onset More than a month ago   Pain Frequency Constant   Aggravating Factors  walking   Pain Relieving Factors using a cane   Effect of Pain on Daily Activities walk with a limp   Multiple Pain Sites No   Multiple Pain Sites No                      Pelvic Floor Special Questions - 06/05/15 0001    Pelvic Floor Internal Exam Patient approves physical therapist to perform pelvic floor muscle assessment   Exam Type Vaginal           [facility] Adult PT Treatment/Exercise - 06/05/15 0001    Manual Therapy   Manual Therapy Internal Pelvic Floor   Internal Pelvic Floor bil. sides of obturator internist, bil. levator ani, bil. puborectalis with one finger internally and other externally  PT Education - 06/05/15 1307    Education provided No          PT Short Term Goals - 02/27/15 1011    PT SHORT TERM GOAL #1   Title pain with walking decreased >/= 25%   Time 3   Period Weeks   Status Achieved   PT SHORT TERM GOAL #2   Title pelvis stays in correct alignment for 3 weeks   Time 3   Period Weeks   Status Achieved           PT Long Term Goals - 05/22/15 1316    PT LONG TERM GOAL #1   Title indpendent with HEP   Time 6   Period Weeks   Status On-going   PT LONG TERM GOAL #2   Title pain with walking decreased >/= 80%   Time 6   Period Weeks   Status On-going   PT LONG TERM GOAL #3    Title go up and down steps with a step over step pattern   Time 6   Period Weeks   Status On-going           Long Term Clinic Goals - 06/01/15 1212    CC Long Term Goal  #1   Title Reports left upper back and left side pain is controlled at 4/10 or less with therapy at the current frequency.   Baseline Difficult to control recently, with it staying in 5-6/10 range.   Status On-going   CC Long Term Goal  #2   Title Pt. will report swelling is adequately managed to enable ADL function at a consistent level.   Status On-going            Plan - 06/05/15 1309    Clinical Impression Statement Patient hs a fracture in right ishcial tuberosity and increased leisions in the pelvic area.  Patient came in with increased limp and pain in pelvic area.  After therapy patient pain has decreased by 50% and her limp on the right has decreased by 50%. Patient is not able to wear the suit to reduce her lymphedema due to her right groin pain.  Patient would benefit fromp hysical therapy to reduce pain and her righ tlimp to decresae stress on the pelvic bones.    Pt will benefit from skilled therapeutic intervention in order to improve on the following deficits Pain;Abnormal gait;Increased muscle spasms;Increased fascial restricitons;Increased edema   Rehab Potential Good   Clinical Impairments Affecting Rehab Potential active cancer   PT Frequency 2x / week   PT Duration 12 weeks   PT Treatment/Interventions Manual techniques;Manual lymph drainage;ADLs/Self Care Home Management   PT Next Visit Plan update goals, soft tissue work   PT Home Exercise Plan progress as needed   Consulted and Agree with Plan of Care Patient   PT Plan Continue manual lymph drainage, soft tissue work.        Problem List Patient Active Problem List   Diagnosis Date Noted  . 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 Zoster 04/04/2015  . Nausea with vomiting 11/18/2014  . Constipation 11/18/2014  . Left-sided  thoracic back pain   . Bone metastases ([place]) 11/16/2014  . Back pain 11/15/2014  . Uncontrolled pain 11/14/2014  . 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 Chest wall pain 03/21/2014  . Abnormal LFTs (liver function tests) 09/12/2013  . Breast cancer of upper-inner quadrant of right female breast ([place]) 08/18/2013  . Secondary malignant neoplasm of  mediastinal lymph node ([place]) 08/18/2013    [name], PT 06/05/2015 1:13 PM    [facility] [address] [place], [place], [address] Phone: [phone]   Fax:  [phone]  Name: [name] [name] MRN: [id number] Date of Birth: 1960/09/22

## 2015-06-06 ENCOUNTER — Other Ambulatory Visit: Payer: Self-pay | Admitting: *Deleted

## 2015-06-06 ENCOUNTER — Ambulatory Visit (HOSPITAL_BASED_OUTPATIENT_CLINIC_OR_DEPARTMENT_OTHER): Payer: 59

## 2015-06-06 ENCOUNTER — Ambulatory Visit (HOSPITAL_BASED_OUTPATIENT_CLINIC_OR_DEPARTMENT_OTHER): Payer: 59 | Admitting: Oncology

## 2015-06-06 ENCOUNTER — Telehealth: Payer: Self-pay | Admitting: Oncology

## 2015-06-06 VITALS — BP 122/84 | HR 90 | Temp 98.2°F

## 2015-06-06 DIAGNOSIS — Z5111 Encounter for antineoplastic chemotherapy: Secondary | ICD-10-CM | POA: Diagnosis not present

## 2015-06-06 DIAGNOSIS — C771 Secondary and unspecified malignant neoplasm of intrathoracic lymph nodes: Secondary | ICD-10-CM

## 2015-06-06 DIAGNOSIS — C7951 Secondary malignant neoplasm of bone: Secondary | ICD-10-CM

## 2015-06-06 DIAGNOSIS — R7989 Other specified abnormal findings of blood chemistry: Secondary | ICD-10-CM

## 2015-06-06 DIAGNOSIS — C50211 Malignant neoplasm of upper-inner quadrant of right female breast: Secondary | ICD-10-CM

## 2015-06-06 DIAGNOSIS — R945 Abnormal results of liver function studies: Secondary | ICD-10-CM

## 2015-06-06 MED ORDER — DENOSUMAB 120 MG/1.7ML ~~LOC~~ SOLN
120.0000 mg | Freq: Once | SUBCUTANEOUS | Status: AC
  Administered 2015-06-06: 120 mg via SUBCUTANEOUS
  Filled 2015-06-06: qty 1.7

## 2015-06-06 MED ORDER — FULVESTRANT 250 MG/5ML IM SOLN
500.0000 mg | Freq: Once | INTRAMUSCULAR | Status: AC
  Administered 2015-06-06: 500 mg via INTRAMUSCULAR
  Filled 2015-06-06: qty 10

## 2015-06-06 NOTE — Telephone Encounter (Signed)
appt made per 3/8 pof. Pt will use MY Chart for updated schedule

## 2015-06-06 NOTE — Progress Notes (Signed)
[facility]  [phone] Fax:(336) [phone]     ID: [name] [name] OB: 1961-01-10  MR#: [id number]  UYQ#:[id number]  [name]: [name] [name] GYN:  [name] [name] SU:  OTHER MD: [name] [name], [name] [name], [name] [name], [name] [name], [name] [name]  CHIEF COMPLAINT: Stage IV breast cancer  CURRENT TREATMENT: Fulvestrant, denosumab, palbociclib  BREAST CANCER HISTORY: From doctor [name] [name]'s intake note 03/20/2004:  "The patient is a very pleasant 55 year old female, without significant past medical history.  Her family history is significant for a sister who at age 46 was diagnosed with invasive ductal carcinoma.  She is a breast cancer survivor at age 70 now.  The patient states that she has never really had a screening mammogram until October 2005, when she felt that it was time for her to start having mammograms done on a yearly basis.  Therefore, on 01/26/04, she underwent a screening mammogram and an abnormality was detected in the upper outer right breast.  She, therefore, underwent spot compression views of both the right and the left breast.  The left breast revealed a well-defined mass in the upper outer left quadrant, present at the 2 o'clock position, measuring 1.8 cm, 6 cm from the nipple.  This, by ultrasound, was felt to be a simple cyst measuring 1.8 cm.  On the right breast, a spiculated mass was noted in the upper outer right quadrant.  The ultrasound revealed a shadowing irregular solid mass at the 10:30 position, 9 cm from the nipple, measuring 1.2 cm in greatest dimension, correlating with the spiculated mass seen on the mammogram.  The right axilla was negative ultrasonically.  Because of this, the patient underwent a needle biopsy of the right breast and the biopsy was positive invasive mammary carcinoma that showed features consistent with a high-grade invasive ductal carcinoma associated with desmoplastic stroma.  No in  situ component was seen and no definite lymphovascular invasion was identified.  On the core biopsy, the tumor measured about 0.8 cm.  Because of this, she was seen by [name] [name] and the patient was taken to [facility] 15, 2004.  She underwent a right breast lumpectomy with sentinel node biopsy.  The final pathology revealed an invasive ductal carcinoma, measuring 1.7 cm, grade 2 of 3.  Margins were free of tumor.  Atypical lobular hyperplasia was noted.  One sentinel node was removed which was negative for metastatic disease.  The tumor was staged at T1c, N0 MX.  It was estrogen receptor positive, progesterone receptor positive.  HER-2/neu was 2+.  FISH was negative.  All margins were free of tumor.  She is now seen in Medical Oncology for further evaluation and management of this newly diagnosed T1c, node negative, stage I, invasive ductal carcinoma of the right breast."  Her subsequent history is as detailed below  INTERVAL HISTORY: [name] [name] returns today for follow-up of her metastatic breast cancer accompanied by her husband [name] [name]. She started palbociclib early January at very minimal doses, one tablet a week, and very slowly we have been able to increase the dose without her having a significant increase in symptoms, so that 2-1/2 weeks ago she finally started a cycle of 75 mg every other day, which is the minimal dose recorded to be effective in the literature. At the same time she continues on fulvestrant monthly, and agreed to start denosumab, with the first dose 05/09/2015.  She was just restaged and her scans have been over  read by Dr. Richard [name] for consistency. They show growth of her liver lesions as compared to December. However several of the lesions appear to have central necrosis. This is difficult at least for me to evaluate. The scans also show a fracture in the right inferior pubic ramus. Of course she has multiple other bone lesions. The left chest wall/pleural metastatic  disease is stable  REVIEW OF SYiSTEMS: [name] [name] still has significant issues. She complains of cognitive dysfunction. Her left rib cage hurts. She is very tired. She is nauseated. She gets diarrhea from the palbociclib and constipation from the fulvestrant. She had red eyes 1 night but that has cleared. She gets bony aches from the extreme of which can last several days. By the middle of the afternoon she [place]. She usually takes it easy until supper and then tries to be a little bit more active. The pain in the pelvic area persists. It is helped by physical therapy. She is using a cane to help it out. There have been no fever, bleeding, or rash. A detailed review of systems today was otherwise stable.  PAST MEDICAL HISTORY: Past Medical History  Diagnosis Date  . Seizures ([place]) 2010    Isolated incident.  Marland Kitchen PONV (postoperative nausea and vomiting)   . Peripheral vascular disease ([place]) 02/2010    blood clot related to porta cath  . S/P radiation therapy 07/17/2014 through 08/02/2014     Left mediastinum, left seventh rib 3250 cGy in 13 sessions   . S/P radiation therapy 12/11/2014 through 12/22/2014     Left parietal calvarium 2400 cGy in 8 sessions   . Breast cancer ([place]) dx'd 2005/2011  . Bone metastases ([place]) dx'd 05/2014    PAST SURGICAL HISTORY: Past Surgical History  Procedure Laterality Date  . Breast lumpectomy  2005  . Axillary lymph node dissection  Dec. 2011  . Portacath placement  12/11  . Removal portacath    . Mediastinotomy chamberlain mcneil Left 06/02/2013    Procedure: MEDIASTINOTOMY CHAMBERLAIN MCNEIL;  Surgeon: [name], MD;  [facility]: [facility] OR;  Service: Thoracic;  Laterality: Left;  LEFT ANTERIOR MEDIASTINOTOMY     FAMILY HISTORY Family History  Problem Relation Age of Onset  . COPD Mother   . Breast  cancer Sister 20   The patient's father is living, 67 years old as of may 2015. He lives in [place]. The patient's mother died from complications of COPD at the age of 68. These has 2 brothers, one sister. Her sister developed breast cancer at the age of 72. She is doing well. The patient herself underwent genetic testing at [facility] in 2011 and was found to be BRCA negative  GYNECOLOGIC HISTORY:  Menarche age 84, she is GX P0. She stopped having periods with her initial chemotherapy in 2006.  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 Her husband [name] is a Medical illustrator in [place]. He has a child from a prior marriage. At home they have 2 rescue dogs, Hobo and Kewanna. The patient is religious but not a church attender    ADVANCED DIRECTIVES: In place; at the 08/04/2014 visit in particular the patient was very clear, with her husband present, that she would not want any kind of feeding tubes or "other tubes" if her condition deteriorated.   HEALTH MAINTENANCE: Social History  Substance Use Topics  . Smoking status: Never Smoker   . Smokeless tobacco:  Never Used  . Alcohol Use: No     Colonoscopy:  PAP:  Bone density: March 2015; mild osteopenia  Lipid panel:  Allergies  Allergen Reactions  . 2nd Skin Quick Heal Other (See Comments)    Other Reaction: Skin peels  . Decadron [Dexamethasone] Other (See Comments)    Patient does not tolerate steroids.   . Dilaudid [Hydromorphone] Nausea And Vomiting  . Enoxaparin Other (See Comments)    unknown  . Fluconazole Swelling    Liver toxicity  . Hydromorphone Hcl Nausea And Vomiting  . Morphine And Related Nausea And Vomiting  . Protonix [Pantoprazole Sodium] Other (See Comments)    Patient reports it caused thrush.  . Tegaderm Ag Mesh [Silver]     Current Outpatient Prescriptions  Medication Sig Dispense Refill  . acetaminophen (TYLENOL) 160 mg/5 mL SOLN  Reported on 03/27/2015    . ALPRAZolam (XANAX) 0.5 MG tablet Take 1 tablet (0.5 mg total) by mouth 2 (two) times daily as needed for anxiety. 30 tablet 0  . B Complex-C (B-COMPLEX WITH VITAMIN C) tablet Take 1 tablet by mouth daily. Reported on 03/27/2015    . cholecalciferol 2000 UNITS tablet Take 1 tablet (2,000 Units total) by mouth daily.    . Diphenhyd-Hydrocort-Nystatin (FIRST-DUKES MOUTHWASH) SUSP 5-10 ml qid SWISH AND SPIT 220 mL 3  . folic acid (FOLVITE) 1 MG tablet Take 1 tablet (1 mg total) by mouth daily.    Marland Kitchen ibuprofen (ADVIL,MOTRIN) 200 MG tablet Take 400 mg by mouth every 4 (four) hours as needed for moderate pain. Reported on 03/27/2015    . magnesium chloride (SLOW-MAG) 64 MG TBEC SR tablet Take 60 mg by mouth daily.    . Melatonin 3 MG TABS Take 3 mg by mouth at bedtime.    . naproxen sodium (ANAPROX) 220 MG tablet Take 220 mg by mouth 2 (two) times daily with a meal.    . nystatin (MYCOSTATIN) 100000 UNIT/ML suspension Take 5 mLs (500,000 Units total) by mouth 4 (four) times daily. 240 mL 3  . palbociclib (IBRANCE) 75 MG capsule Take 1 capsule (75 mg total) by mouth daily with breakfast. Take whole with food. 21 capsule 6  . saccharomyces boulardii (FLORASTOR) 250 MG capsule Take 250 mg by mouth daily.      No current facility-administered medications for this visit.    OBJECTIVE: Middle-aged white woman  Filed Vitals:     There is no weight on file to calculate BMI.   Filed Vitals:     Patient refused vitals today 06/06/2015     ECOG FS: 2  Sclerae unicteric, pupils round and equal Oropharynx clear and moist-- no thrush or other lesions No cervical or supraclavicular adenopathy Lungs no rales or rhonchi Heart regular rate and rhythm Abd soft, nontender, positive bowel sounds MSK no focal spinal tenderness, no upper extremity lymphedema Neuro: nonfocal, well oriented, anxious affect Breasts: Deferred   LAB RESULTS:   CMP     Component Value Date/Time   NA  138 06/04/2015 1148   NA 140 12/14/2014 0800   K 4.0 06/04/2015 1148   K 3.6 12/14/2014 0800   CL 103 12/14/2014 0800   CL 105 05/06/2012 1333   CO2 26 06/04/2015 1148   CO2 28 12/14/2014 0800   GLUCOSE 77 06/04/2015 1148   GLUCOSE 100* 12/14/2014 0800   GLUCOSE 124* 05/06/2012 1333   BUN 10.4 06/04/2015 1148   BUN 6 12/14/2014 0800   CREATININE 0.8 06/04/2015 1148   CREATININE  0.64 12/14/2014 0800   CALCIUM 9.5 06/04/2015 1148   CALCIUM 9.6 12/14/2014 0800   PROT 7.4 06/04/2015 1148   PROT 5.6* 11/20/2014 0433   ALBUMIN 3.9 06/04/2015 1148   ALBUMIN 2.6* 11/20/2014 0433   AST 24 06/04/2015 1148   AST 28 11/20/2014 0433   ALT 33 06/04/2015 1148   ALT 62* 11/20/2014 0433   ALKPHOS 93 06/04/2015 1148   ALKPHOS 105 11/20/2014 0433   BILITOT 0.31 06/04/2015 1148   BILITOT 0.7 11/20/2014 0433   GFRNONAA >60 12/14/2014 0800   GFRAA >60 12/14/2014 0800    No results found for: SPEP  Lab Results  Component Value Date   WBC 3.5* 06/04/2015   NEUTROABS 2.4 06/04/2015   HGB 12.9 06/04/2015   HCT 38.2 06/04/2015   MCV 96.0 06/04/2015   PLT 332 06/04/2015      Chemistry      Component Value Date/Time   NA 138 06/04/2015 1148   NA 140 12/14/2014 0800   K 4.0 06/04/2015 1148   K 3.6 12/14/2014 0800   CL 103 12/14/2014 0800   CL 105 05/06/2012 1333   CO2 26 06/04/2015 1148   CO2 28 12/14/2014 0800   BUN 10.4 06/04/2015 1148   BUN 6 12/14/2014 0800   CREATININE 0.8 06/04/2015 1148   CREATININE 0.64 12/14/2014 0800      Component Value Date/Time   CALCIUM 9.5 06/04/2015 1148   CALCIUM 9.6 12/14/2014 0800   ALKPHOS 93 06/04/2015 1148   ALKPHOS 105 11/20/2014 0433   AST 24 06/04/2015 1148   AST 28 11/20/2014 0433   ALT 33 06/04/2015 1148   ALT 62* 11/20/2014 0433   BILITOT 0.31 06/04/2015 1148   BILITOT 0.7 11/20/2014 0433       Lab Results  Component Value Date   LABCA2 25 11/02/2007    No components found for: [id number]  No results for input(s): INR in  the last 168 hours.  Urinalysis    Component Value Date/Time   COLORURINE YELLOW 11/17/2014 0143   APPEARANCEUR CLOUDY* 11/17/2014 0143   LABSPEC 1.010 11/17/2014 0143   LABSPEC 1.005 09/12/2013 1542   PHURINE 6.5 11/17/2014 0143   PHURINE 6.0 09/12/2013 1542   GLUCOSEU NEGATIVE 11/17/2014 0143   GLUCOSEU Negative 09/12/2013 1542   HGBUR NEGATIVE 11/17/2014 0143   HGBUR Negative 09/12/2013 1542   BILIRUBINUR NEGATIVE 11/17/2014 0143   BILIRUBINUR Negative 09/12/2013 1542   KETONESUR NEGATIVE 11/17/2014 0143   KETONESUR Negative 09/12/2013 1542   PROTEINUR NEGATIVE 11/17/2014 0143   PROTEINUR Negative 09/12/2013 1542   UROBILINOGEN 0.2 11/17/2014 0143   UROBILINOGEN 0.2 09/12/2013 1542   NITRITE NEGATIVE 11/17/2014 0143   NITRITE Negative 09/12/2013 1542   LEUKOCYTESUR NEGATIVE 11/17/2014 0143   LEUKOCYTESUR Negative 09/12/2013 1542    STUDIES: Dg Chest 2 View  06/01/2015  CLINICAL DATA:  Left posterior chest pain.  Prior chest tube. EXAM: CHEST  2 VIEW COMPARISON:  04/20/2015 FINDINGS: Left upper lobe airspace disease along with low left pleural effusion and left basilar atelectasis or scarring again noted, not changed since prior study. No effusion. Right lung is clear. Heart is normal size. IMPRESSION: No change in the left pleural effusion, left base atelectasis and left upper lobe consolidation since prior study. No pneumothorax. Electronically Signed   By: Rolm Baptise M.D.   On: 06/01/2015 11:44   Mr Pelvis W Wo Contrast  05/30/2015  CLINICAL DATA:  History of breast cancer. History of bony metastasis. Evaluate sacral and pubic  rami lesions. EXAM: MRI PELVIS WITHOUT AND WITH CONTRAST TECHNIQUE: Multiplanar multisequence MR imaging of the pelvis was performed both before and after administration of intravenous contrast. CONTRAST:  14 mL MultiHance COMPARISON:  CT 03/22/2015, PET-CT 03/19/2015, PET-CT 10/30/2014 FINDINGS: Bone T1 hypointense osseous lesion in the right superior  pubic ramus measuring 12 mm with enhancement on postcontrast imaging. Similar T1 hypointense enhancing lesion in the left pubic body. T1 hypo intense osseous lesion encompassing nearly the entirety of the right inferior pubic ramus with enhancement on postcontrast imaging with an associated fracture. There are multiple small T1 hypo intense enhancing lesions located in the right-sided the sacrum, left-sided sacrum, left posterior ilium and right iliac wing. There is a enhancing L3 vertebral body lesion. There is a enhancing lesion involving the right L2 transverse process. These lesions are most consistent with metastatic disease. There is a possible small 9 mm enhancing lesion in the left side of the L4 vertebral body. There is no hip fracture, dislocation or avascular necrosis. Alignment Normal. No subluxation. Dysplasia None. Joint effusion None. Labrum Grossly intact, but evaluation is limited by lack of intraarticular fluid. Cartilage Femoral cartilage: Normal. Acetabular cartilage: Normal. Capsule and ligaments Normal. Muscles and Tendons Flexors: Normal. Extensors: Normal. Abductors: Normal. Adductors: Normal. Rotators: Normal. Hamstrings: Normal. Other Findings None Viscera No abnormality seen in pelvis. No lymphadenopathy. No free fluid in the pelvis. IMPRESSION: 1. Numerous small bone lesions detailed above involving the right superior pubic ramus, right inferior pubic ramus, left pubic body, right and left sacrum, ilium bilaterally and L3 vertebral body most consistent with metastatic disease. Accounting for the differences in modalities, there is no significant interval change compared with the PET-CT performed 03/19/2015. 2. Pathologic fracture involving the right inferior pubic ramus. Electronically Signed   By: Kathreen Devoid   On: 05/30/2015 17:30   Mr Abdomen W Wo Contrast  06/01/2015  CLINICAL DATA:  Metastatic breast cancer. Re-evaluate liver lesions on prior abdominal CT. History of bone  metastasis. Status post radiation therapy 07/17/2014 through 08/02/2014 and 12/11/2014 through 12/22/2014. Bone metastasis diagnosed 3/16. Left-sided malignant pleural effusion. EXAM: MRI ABDOMEN WITHOUT AND WITH CONTRAST TECHNIQUE: Multiplanar multisequence MR imaging of the abdomen was performed both before and after the administration of intravenous contrast. CONTRAST:  34m MULTIHANCE GADOBENATE DIMEGLUMINE 529 MG/ML IV SOLN COMPARISON:  Most direct comparison the abdominal CT of 03/22/2015. The PET of 03/19/2015 and the chest CT of 03/13/2015 are also reviewed. FINDINGS: Lower chest: Left-sided pleural-based metastasis. Example medial pleural lesion measures 1.5 x 1.4 cm on image 12/ series 1901. Compare 1.7 x 1.2 cm on the 03/22/2015 CT (when remeasured). This suggests stability. A rind of left-sided pleural thickening measures 1.3 cm on image 16/series 11 and is similar to on the prior exam (when remeasured). Minimal left pleural fluid is similar. Hepatobiliary: Given limitations of cross modality comparison to the 03/22/2015 CT, progression of hepatic metastasis. For example, a high anterior segment right liver lobe lesion measures 1.4 cm on image 17/ series 11 and is likely new since the prior exam. Index lesion within the anterior segment right liver lobe more inferiorly measures 3.0 x 2.4 cm on image 21/ series 11. Compare 1.5 x 2.0 cm at the same level on the prior exam. Just cephalad to this lesion is a 1.9 x 1.4 cm lesion on image 16/ series 11 which was only subtly apparent on the prior exam. A 9 mm lateral segment left liver lobe lesion on image 21/series 11 is also new. 1.4  cm gallstone without acute cholecystitis or biliary duct dilatation. Pancreas: Normal, without mass or ductal dilatation. Spleen: Normal in size, without focal abnormality. Adrenals/Urinary Tract: Normal adrenal glands. Normal kidneys, without hydronephrosis. Stomach/Bowel: Normal stomach and abdominal bowel loops.  Vascular/Lymphatic: Normal caliber of the aorta and branch vessels. Circumaortic left renal vein. Left periaortic adenopathy. An example node measures 2.0 x 1.5 cm on image 31/series 11. Compare 2.8 x 1.8 cm on the prior exam. Other: No ascites.  No evidence of omental or peritoneal disease. Musculoskeletal: Osseous metastasis are again identified. Given cross modality comparison, likely similar to on the prior PET (these lesions are relatively CT occult). Examples within a right transverse process (likely L2) on image 54/ series 1902 and within the right L1 lamina on image 47/ series 1902. IMPRESSION: 1. Compared to the CT of 03/22/2015, progression of hepatic metastasis. Please note that direct comparison is somewhat difficult secondary to differences in modality today (i.e. increased sensitivity of MRI for liver metastasis). Today's exam will serve as a baseline. 2. Similar left-sided pleural metastasis since 03/22/2015. 3. Slight improvement in retroperitoneal nodal metastasis. 4. Osseous metastasis.  Grossly similar to on the PET of 03/19/2015. 5. Cholelithiasis. Electronically Signed   By: Abigail Miyamoto M.D.   On: 06/01/2015 16:28   Dg Pelvis Comp Min 3v  05/15/2015  CLINICAL DATA:  Right pubic pain, history of breast carcinoma, no injury EXAM: [facility] PELVIS - 3+ VIEW COMPARISON:  CT abdomen pelvis of 03/22/2015 FINDINGS: No acute fracture is seen. Mild degenerative change in the hips is noted. The pelvic rami appear intact. There are a few radiolucencies within the right inferior pelvic ramus of uncertain significance. Lytic metastases cannot be excluded. The SI joints are unremarkable. There are degenerative changes in the lower lumbar spine. IMPRESSION: 1. No acute fracture. 2. Radiolucencies within the right inferior pelvic ramus of uncertain significance. Correlate clinically. Electronically Signed   By: Ivar Drape M.D.   On: 05/15/2015 09:56    ASSESSMENT: 55 y.o. [place] woman with stage IV  breast cancer, history as follows  (1)  S/p Right lumpectomy and sentinel lymph node sampling 03/15/2004 for a pT1c pN0. Stage IA invasive ductal carcinoma, grade 2, estrogen receptor 95% positive, progesterone receptor 65% positive, HER-2 not amplified; additional surgery 04/25/2004 for seroma or clearance showed no residual tumor  (2) adjuvant chemotherapy with cyclophosphamide and doxorubicin every 21 days x4 completed 07/19/2004  (3) adjuvant radiation given under Dr. Donella Stade in [place] completed July 2006  (4) the patient opted against adjuvant antiestrogen therapy  (5) genetics testing showed no BRCA mutations  (6) biopsy of a palpable right axillary mass 10/24/2009 showed invasive ductal carcinoma, grade 3, estrogen receptor 100% positive, progesterone receptor 2% positive (alert score 5) HER-2 negative; no evidence of systemic disease on PET scanning  (7) completed 3 of 4 planned cycles of docetaxel and cyclophosphamide September 2011, fourth cycle omitted because of marked elevations in liver function tests  (8) an right axillary lymph node dissection 03/06/2010 showed 3/8 lymph nodes removed to be involved by tumor, with extracapsular extension.  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, TTS-1 negative), estrogen receptor 80% positive, progesterone receptor  2% positive, HER-2 not amplified  (12) letrozole started March 2015-- tolerated with significant side effects, discontinued at the end of May 2015  (13) PET scan 08/16/2013 shows extensive left pleural metastatic disease and a  large left pleural effusion that shifts cardiac and mediastinal structures to the right; adenopathy (celiac trunk, periadrenal, periaortic); and a left medial clavicular lesion; Status post left thoracentesis 08/16/2013 positive for adenocarcinoma, estrogen receptor positive, progesterone receptor negative.  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; everolimus was suggested as an available targeted agent  (18) exemestane started 03/31/2014, discontinued 10/31/2014 with evidence of progression  (a) everolimus added 04/03/2014 but not tolerated (cytopenias, elevated LFTs) even at minimal doses; stopped 04/17/2014  (19) fulvestrant started 12/20/2014  (a) palbociclib added at very low dose 04/03/2015 (starting dose 75 mg weekly)    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 treated with Lovenox for 5-6 months  (c) right upper extremity lymphedema--receiving physical therapy  (d) hepatic steatosis with chronically elevated LFTs as well as unusual hepatic sensitivity to chemotherapy  (e) osteopenia with the lowest T score -1.6 on bone density scan 06/20/2013  (f) radiation oncology (Dr Valere Dross) has reviewed prior radiation records in case there is further mediastinal involvement with dysphagia etc in which case palliative XRT could be considered  (a) radiation to left mediastinum/ left 7th rib 3250 cGy in 13  sessions04/18/2016 through 08/02/2014  (b) radiation to T11 area: 22 Gy in 7 sessions, last dose 11/27/2014  (c) radiation left parietal scalp region to be completed 12/22/2014  (d) radiation to sacral area completed 04/09/2015  (g) chest wall and perineal pain--improved post radiation treatments  (h) zoster diagnosed 04/04/2015-- on valacyclovir   PLAN: I spent approximately 50 minutes with these and [name] today going over her situation. She is very appreciative of Dr. Johney Maine overreading her scans. She is accepting of the fact that she has a pubic ramus fracture and is very frightened that she might develop a contralateral pubic ramus fracture. She is concerned she might not be able to walk under those circumstances. However with active treatment if it works anywhere else it will worked there as well so we are not anticipating that problem.  We reviewed her liver lesions. These have clearly grown as compared to December. However she really only started an adequate dose of palbociclib 3 weeks ago (she will complete her first full cycle, at the minimal approved dose, this weekend). Accordingly I don't think we can conclude that there has been growth through this treatment. Instead the plan will be to go complete 2 more cycles at the current dose, and then restage.  It is remarkable that her current medications are not causing her any liver function problems. She is concerned about neutropenia, but there is no neutropenia at present. There is been a very minimal leukopenia. This is of no consequence. She was also concerned about the RDW being elevated. We reviewed that and she understands this is not a significant issue for her.  Since  she is completing this cycle on Saturday, 06/09/2015, she should be starting the new 1 a week later. However she wants to wait until March 21 because she wants to have 2 lab tests (drawn every Monday) before proceeding.  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 She is also tolerating the fulvestrant and denosumab better, although it is still a big effort for her. She is now receiving both drugs at the same day, which is favorable  In short, with much effort on Laquanna is part we have been able to proceed to a treatment program that has some chance of controlling her cancer. She will continue on fulvestrant monthly with monthly denosumab and palbociclib at 75 mg every other day, 21 days on and 7 days off. We will repeat her scans and particularly the liver MRI in May, after 2 additional cycles. We will continue to check her lab work every Monday.  These has a good understanding of this plan. She agrees with it. She will call with any problems that may develop before her next visit.     Chauncey Cruel, MD   06/06/2015 12:20 PM

## 2015-06-07 ENCOUNTER — Ambulatory Visit: Payer: 59 | Admitting: Physical Therapy

## 2015-06-07 ENCOUNTER — Telehealth: Payer: Self-pay

## 2015-06-07 ENCOUNTER — Encounter: Payer: Self-pay | Admitting: Physical Therapy

## 2015-06-07 DIAGNOSIS — R1031 Right lower quadrant pain: Secondary | ICD-10-CM | POA: Diagnosis not present

## 2015-06-07 DIAGNOSIS — M6289 Other specified disorders of muscle: Secondary | ICD-10-CM

## 2015-06-07 DIAGNOSIS — M25551 Pain in right hip: Secondary | ICD-10-CM

## 2015-06-07 NOTE — Therapy (Signed)
[facility] [address] [place], [place], [address] Phone: [phone]   Fax:  [phone]  Physical Therapy Treatment  Patient Details  Name: [name] [name] MRN: [id number] Date of Birth: 1960/12/30 Referring Provider: Dr. Lurline Del  Encounter Date: 06/07/2015      PT End of Session - 06/07/15 1233    Visit Number 37  pelvis   Date for PT Re-Evaluation 06/25/15   Authorization - Visit Number 140   Authorization - Number of Visits 37   PT Start Time [id number]   PT Stop Time 1308   PT Time Calculation (min) 38 min   Activity Tolerance Patient tolerated treatment well   Behavior During Therapy [facility] for tasks assessed/performed      Past Medical History  Diagnosis Date  . Seizures ([place]) 2010    Isolated incident.  Marland Kitchen PONV (postoperative nausea and vomiting)   . Peripheral vascular disease ([place]) 02/2010    blood clot related to porta cath  . S/P radiation therapy 07/17/2014 through 08/02/2014     Left mediastinum, left seventh rib 3250 cGy in 13 sessions   . S/P radiation therapy 12/11/2014 through 12/22/2014     Left parietal calvarium 2400 cGy in 8 sessions   . Breast cancer ([place]) dx'd 2005/2011  . Bone metastases ([place]) dx'd 05/2014    Past Surgical History  Procedure Laterality Date  . Breast lumpectomy  2005  . Axillary lymph node dissection  Dec. 2011  . Portacath placement  12/11  . Removal portacath    . Mediastinotomy chamberlain mcneil Left 06/02/2013    Procedure: MEDIASTINOTOMY CHAMBERLAIN MCNEIL;  Surgeon: [name], MD;  Location: [place];  Service: Thoracic;  Laterality: Left;  LEFT ANTERIOR MEDIASTINOTOMY     There were no vitals filed for this visit.  Visit Diagnosis:  Groin pain, right  Pain in joint involving right pelvic  region and thigh  Muscle stiffness      Subjective Assessment - 06/07/15 1234    Subjective I just had another xjeva shot. Pain is better.    How long can you walk comfortably? make patient limp   Diagnostic tests x rays of back show no fractures;    Patient Stated Goals walk 20 minutes with minimal limp   Currently in Pain? Yes   Pain Score 6   short walk with cane 5/10, rest pain 3/10   Pain Location Groin   Pain Orientation Right   Pain Descriptors / Indicators Burning   Pain Type Chronic pain   Pain Onset More than a month ago   Pain Frequency Constant   Aggravating Factors  walking   Pain Relieving Factors using cane, therapy   Effect of Pain on Daily Activities walk with a  limp   Multiple Pain Sites No                         [facility] Adult PT Treatment/Exercise - 06/07/15 0001    Manual Therapy   Manual Therapy Internal Pelvic Floor;Soft tissue mobilization   Soft tissue mobilization right buttocks   Internal Pelvic Floor bil. sides of obturator internist, bil. levator ani, bil. puborectalis with one finger internally and other externally                PT Education - 06/07/15 1314    Education provided No          PT Short Term Goals - 02/27/15 1011  PT SHORT TERM GOAL #1   Title pain with walking decreased >/= 25%   Time 3   Period Weeks   Status Achieved   PT SHORT TERM GOAL #2   Title pelvis stays in correct alignment for 3 weeks   Time 3   Period Weeks   Status Achieved           PT Long Term Goals - 06/07/15 1318    PT LONG TERM GOAL #1   Title indpendent with HEP   Time 6   Period Weeks   Status On-going  still learning   PT LONG TERM GOAL #2   Title pain with walking decreased >/= 80%   Time 6   Period Weeks   Status On-going   PT LONG TERM GOAL #3   Title go up and down steps with a step over step pattern   Time 6   Period Weeks   Status On-going           Long Term Clinic Goals - 06/01/15 1212     CC Long Term Goal  #1   Title Reports left upper back and left side pain is controlled at 4/10 or less with therapy at the current frequency.   Baseline Difficult to control recently, with it staying in 5-6/10 range.   Status On-going   CC Long Term Goal  #2   Title Pt. will report swelling is adequately managed to enable ADL function at a consistent level.   Status On-going            Plan - 06/07/15 1315    Clinical Impression Statement Patient is getting better.  Her pain after therapy is 4/5 and no limp when walking. Patient has increased tissue mobility during soft tissue work. Patient benefits from physical therapy to reduce pain to decrease strain on fractured ischial tuberosity.    Pt will benefit from skilled therapeutic intervention in order to improve on the following deficits Pain;Abnormal gait;Increased muscle spasms;Increased fascial restricitons;Increased edema   Rehab Potential Good   Clinical Impairments Affecting Rehab Potential active cancer   PT Frequency 2x / week   PT Duration 12 weeks   PT Treatment/Interventions Manual techniques;Manual lymph drainage;ADLs/Self Care Home Management   PT Next Visit Plan update goals, soft tissue work   PT Home Exercise Plan progress as needed   Consulted and Agree with Plan of Care Patient   PT Plan Continue manual lymph drainage, soft tissue work.        Problem List Patient Active Problem List   Diagnosis Date Noted  . 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 Zoster 04/04/2015  . Nausea with vomiting 11/18/2014  . Constipation 11/18/2014  . Left-sided thoracic back pain   . Bone metastases ([place]) 11/16/2014  . Back pain 11/15/2014  . Uncontrolled pain 11/14/2014  . 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 Chest wall pain 03/21/2014  . Abnormal LFTs (liver function tests) 09/12/2013  . Breast cancer of upper-inner quadrant of right female breast ([place]) 08/18/2013  . 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, [place], [place], [address] Phone: [phone]   Fax:  [phone]  Name: [name] [name] MRN: [id number] Date of Birth: 10/23/1960

## 2015-06-07 NOTE — Telephone Encounter (Signed)
Pt left msg advising she had migraine like headache with this last xgeva but reports she forgot to take Aleve prior to the shot.  Pt is requesting that Abigail Miyamoto review the lesiong in her livers - she would like to know if the "hole in the lesions" could be due to the liver biopsy.  Msg routed to Dr. Jana Hakim

## 2015-06-08 ENCOUNTER — Other Ambulatory Visit (HOSPITAL_BASED_OUTPATIENT_CLINIC_OR_DEPARTMENT_OTHER): Payer: 59

## 2015-06-08 ENCOUNTER — Ambulatory Visit: Payer: 59 | Admitting: Physical Therapy

## 2015-06-08 DIAGNOSIS — C50211 Malignant neoplasm of upper-inner quadrant of right female breast: Secondary | ICD-10-CM

## 2015-06-08 DIAGNOSIS — J90 Pleural effusion, not elsewhere classified: Secondary | ICD-10-CM

## 2015-06-08 DIAGNOSIS — M549 Dorsalgia, unspecified: Secondary | ICD-10-CM | POA: Diagnosis not present

## 2015-06-08 DIAGNOSIS — M6289 Other specified disorders of muscle: Secondary | ICD-10-CM

## 2015-06-08 DIAGNOSIS — C771 Secondary and unspecified malignant neoplasm of intrathoracic lymph nodes: Secondary | ICD-10-CM

## 2015-06-08 DIAGNOSIS — I890 Lymphedema, not elsewhere classified: Secondary | ICD-10-CM

## 2015-06-08 DIAGNOSIS — M25551 Pain in right hip: Secondary | ICD-10-CM

## 2015-06-08 LAB — CBC WITH DIFFERENTIAL/PLATELET
BASO%: 1.5 % (ref 0.0–2.0)
Basophils Absolute: 0.1 10*3/uL (ref 0.0–0.1)
EOS ABS: 0.1 10*3/uL (ref 0.0–0.5)
EOS%: 2.9 % (ref 0.0–7.0)
HCT: 36.8 % (ref 34.8–46.6)
HGB: 13.1 g/dL (ref 11.6–15.9)
LYMPH%: 23.5 % (ref 14.0–49.7)
MCH: 33.2 pg (ref 25.1–34.0)
MCHC: 35.6 g/dL (ref 31.5–36.0)
MCV: 93.4 fL (ref 79.5–101.0)
MONO#: 0.4 10*3/uL (ref 0.1–0.9)
MONO%: 9 % (ref 0.0–14.0)
NEUT%: 63.1 % (ref 38.4–76.8)
NEUTROS ABS: 2.6 10*3/uL (ref 1.5–6.5)
NRBC: 0 % (ref 0–0)
PLATELETS: 274 10*3/uL (ref 145–400)
RBC: 3.94 10*6/uL (ref 3.70–5.45)
RDW: 14.8 % — AB (ref 11.2–14.5)
WBC: 4.1 10*3/uL (ref 3.9–10.3)
lymph#: 1 10*3/uL (ref 0.9–3.3)

## 2015-06-08 NOTE — Therapy (Signed)
[place], [place], [address] Phone: [phone]   Fax:  [phone]  Physical Therapy Treatment  Patient Details  Name: [name] [name] MRN: [id number] Date of Birth: 1961-03-23 Referring Provider: Dr. Lurline Del  Encounter Date: 06/08/2015      PT End of Session - 06/08/15 1238    Visit Number 13  total   Number of Visits 24  for lymph   Date for PT Re-Evaluation 06/25/15   PT Start Time 1023   PT Stop Time 1106   PT Time Calculation (min) 43 min   Activity Tolerance Patient tolerated treatment well   Behavior During Therapy [facility] for tasks assessed/performed      Past Medical History  Diagnosis Date  . Seizures ([place]) 2010    Isolated incident.  Marland Kitchen PONV (postoperative nausea and vomiting)   . Peripheral vascular disease ([place]) 02/2010    blood clot related to porta cath  . S/P radiation therapy 07/17/2014 through 08/02/2014     Left mediastinum, left seventh rib 3250 cGy in 13 sessions   . S/P radiation therapy 12/11/2014 through 12/22/2014     Left parietal calvarium 2400 cGy in 8 sessions   . Breast cancer ([place]) dx'd 2005/2011  . Bone metastases ([place]) dx'd 05/2014    Past Surgical History  Procedure Laterality Date  . Breast lumpectomy  2005  . Axillary lymph node dissection  Dec. 2011  . Portacath placement  12/11  . Removal portacath    . Mediastinotomy chamberlain mcneil Left 06/02/2013    Procedure: MEDIASTINOTOMY CHAMBERLAIN MCNEIL;  Surgeon: [name], MD;  Location: [place];  Service: Thoracic;  Laterality: Left;  LEFT ANTERIOR MEDIASTINOTOMY     There were no vitals filed for this visit.  Visit Diagnosis:  Lymphedema  Muscle stiffness  Pain in joint involving right pelvic region and thigh       Subjective Assessment - 06/08/15 1024    Subjective counts were okay this week but she is feeling so so today; looks pale   Currently in Pain? Yes   Pain Score 6    Pain Location Axilla   Pain Orientation Right   Pain Descriptors / Indicators Other (Comment)  full   Aggravating Factors  increased swelling   Pain Relieving Factors therapy               LYMPHEDEMA/ONCOLOGY QUESTIONNAIRE - 06/08/15 1100    Right Upper Extremity Lymphedema   Other around chest at level between two scars at right lateral breast, 95 cm. (on exhalation)  after treatment today                  [facility] Adult PT Treatment/Exercise - 06/08/15 0001    Manual Therapy   Myofascial Release right axilla with focus at superior scar tightness; gentle release at right upper medial thigh   Manual Lymphatic Drainage (MLD) In left sidelying, posterior interaxillary anastomosis and right axillo-inguinal anastomosis; in supine, short neck, left axilla and anterior interaxillary anastomosis, right groin and axillo-inguinal anastomosis, area between right breast scars, directing toward pathways.  In right sidelying, left periscapular area toward left groin.   Other Manual Therapy soft tissue work at superior and posterior aspect of right medial thigh in right sidelying for pain relief and muscle relaxation                PT Education - 06/07/15 1314    Education provided No  PT Short Term Goals - 02/27/15 1011    PT SHORT TERM GOAL #1   Title pain with walking decreased >/= 25%   Time 3   Period Weeks   Status Achieved   PT SHORT TERM GOAL #2   Title pelvis stays in correct alignment for 3 weeks   Time 3   Period Weeks   Status Achieved           PT Long Term Goals - 06/07/15 1318    PT LONG TERM GOAL #1   Title indpendent with HEP   Time 6   Period Weeks   Status On-going  still learning   PT LONG TERM GOAL #2   Title pain with walking decreased >/= 80%   Time 6    Period Weeks   Status On-going   PT LONG TERM GOAL #3   Title go up and down steps with a step over step pattern   Time 6   Period Weeks   Status On-going           Long Term Clinic Goals - 06/08/15 1241    CC Long Term Goal  #1   Title Reports left upper back and left side pain is controlled at 4/10 or less with therapy at the current frequency.   Status On-going   CC Long Term Goal  #2   Title Pt. will report swelling is adequately managed to enable ADL function at a consistent level.   Status On-going            Plan - 06/08/15 1239    Clinical Impression Statement Patient with less induration but still fullness at right upper outer breast area today.  Measurement around her chest at level between two breast scars was reduced 1 cm. after treatment today compared to measurement taken a few weeks ago (which I think was taken before treatment).   Pt will benefit from skilled therapeutic intervention in order to improve on the following deficits Pain;Abnormal gait;Increased muscle spasms;Increased fascial restricitons;Increased edema   Rehab Potential Good   Clinical Impairments Affecting Rehab Potential active cancer   PT Frequency 2x / week   PT Duration 12 weeks   PT Treatment/Interventions Manual lymph drainage;Manual techniques   PT Next Visit Plan for lymphedema, continue manual lymph drainage and soft tissue work, myofascial work   Oncologist with Plan of Care Patient        Problem List Patient Active Problem List   Diagnosis Date Noted  . 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 Zoster 04/04/2015  . Nausea with vomiting 11/18/2014  . Constipation 11/18/2014  . Left-sided thoracic back pain   . Bone metastases ([place]) 11/16/2014  . Back pain 11/15/2014  . Uncontrolled pain 11/14/2014  . 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 Chest wall pain 03/21/2014  . Abnormal LFTs (liver function tests) 09/12/2013  . Breast cancer of  upper-inner quadrant of right female breast ([place]) 08/18/2013  . 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

## 2015-06-11 ENCOUNTER — Ambulatory Visit: Payer: 59 | Admitting: Physical Therapy

## 2015-06-11 ENCOUNTER — Other Ambulatory Visit (HOSPITAL_BASED_OUTPATIENT_CLINIC_OR_DEPARTMENT_OTHER): Payer: 59

## 2015-06-11 DIAGNOSIS — C50211 Malignant neoplasm of upper-inner quadrant of right female breast: Secondary | ICD-10-CM

## 2015-06-11 DIAGNOSIS — C771 Secondary and unspecified malignant neoplasm of intrathoracic lymph nodes: Secondary | ICD-10-CM

## 2015-06-11 DIAGNOSIS — R7989 Other specified abnormal findings of blood chemistry: Secondary | ICD-10-CM

## 2015-06-11 DIAGNOSIS — J90 Pleural effusion, not elsewhere classified: Secondary | ICD-10-CM

## 2015-06-11 DIAGNOSIS — M25551 Pain in right hip: Secondary | ICD-10-CM

## 2015-06-11 DIAGNOSIS — C7951 Secondary malignant neoplasm of bone: Secondary | ICD-10-CM | POA: Diagnosis not present

## 2015-06-11 DIAGNOSIS — M6289 Other specified disorders of muscle: Secondary | ICD-10-CM

## 2015-06-11 DIAGNOSIS — I890 Lymphedema, not elsewhere classified: Secondary | ICD-10-CM

## 2015-06-11 DIAGNOSIS — M549 Dorsalgia, unspecified: Secondary | ICD-10-CM | POA: Diagnosis not present

## 2015-06-11 DIAGNOSIS — R945 Abnormal results of liver function studies: Secondary | ICD-10-CM

## 2015-06-11 LAB — CBC WITH DIFFERENTIAL/PLATELET
BASO%: 2.1 % — ABNORMAL HIGH (ref 0.0–2.0)
BASOS ABS: 0.1 10*3/uL (ref 0.0–0.1)
EOS%: 2.4 % (ref 0.0–7.0)
Eosinophils Absolute: 0.1 10*3/uL (ref 0.0–0.5)
HEMATOCRIT: 38.7 % (ref 34.8–46.6)
HGB: 13.1 g/dL (ref 11.6–15.9)
LYMPH#: 0.6 10*3/uL — AB (ref 0.9–3.3)
LYMPH%: 16.6 % (ref 14.0–49.7)
MCH: 33 pg (ref 25.1–34.0)
MCHC: 34 g/dL (ref 31.5–36.0)
MCV: 97 fL (ref 79.5–101.0)
MONO#: 0.3 10*3/uL (ref 0.1–0.9)
MONO%: 8 % (ref 0.0–14.0)
NEUT#: 2.6 10*3/uL (ref 1.5–6.5)
NEUT%: 70.9 % (ref 38.4–76.8)
Platelets: 356 10*3/uL (ref 145–400)
RBC: 3.99 10*6/uL (ref 3.70–5.45)
RDW: 16.3 % — ABNORMAL HIGH (ref 11.2–14.5)
WBC: 3.6 10*3/uL — ABNORMAL LOW (ref 3.9–10.3)

## 2015-06-11 LAB — COMPREHENSIVE METABOLIC PANEL
ALK PHOS: 94 U/L (ref 40–150)
ALT: 39 U/L (ref 0–55)
AST: 29 U/L (ref 5–34)
Albumin: 4.1 g/dL (ref 3.5–5.0)
Anion Gap: 7 mEq/L (ref 3–11)
BUN: 10.3 mg/dL (ref 7.0–26.0)
CO2: 27 mEq/L (ref 22–29)
Calcium: 9.2 mg/dL (ref 8.4–10.4)
Chloride: 105 mEq/L (ref 98–109)
Creatinine: 0.8 mg/dL (ref 0.6–1.1)
EGFR: 83 mL/min/{1.73_m2} — AB (ref >90)
GLUCOSE: 66 mg/dL — AB (ref 70–140)
POTASSIUM: 3.8 meq/L (ref 3.5–5.1)
SODIUM: 138 meq/L (ref 136–145)
Total Bilirubin: 0.34 mg/dL (ref 0.20–1.20)
Total Protein: 7.7 g/dL (ref 6.4–8.3)

## 2015-06-11 NOTE — Therapy (Signed)
[place], [place], [address] Phone: [phone]   Fax:  [phone]  Physical Therapy Treatment  Patient Details  Name: [name] [name] MRN: [id number] Date of Birth: 1960-09-29 Referring Provider: Dr. Lurline Del  Encounter Date: 06/11/2015      PT End of Session - 06/11/15 1107    Visit Number 51  total   Number of Visits 24  for lymph   Date for PT Re-Evaluation 06/25/15   Authorization - Visit Number 140   PT Start Time 1025   PT Stop Time 1105   PT Time Calculation (min) 40 min   Activity Tolerance Patient tolerated treatment well   Behavior During Therapy [facility] for tasks assessed/performed      Past Medical History  Diagnosis Date  . Seizures ([place]) 2010    Isolated incident.  Marland Kitchen PONV (postoperative nausea and vomiting)   . Peripheral vascular disease ([place]) 02/2010    blood clot related to porta cath  . S/P radiation therapy 07/17/2014 through 08/02/2014     Left mediastinum, left seventh rib 3250 cGy in 13 sessions   . S/P radiation therapy 12/11/2014 through 12/22/2014     Left parietal calvarium 2400 cGy in 8 sessions   . Breast cancer ([place]) dx'd 2005/2011  . Bone metastases ([place]) dx'd 05/2014    Past Surgical History  Procedure Laterality Date  . Breast lumpectomy  2005  . Axillary lymph node dissection  Dec. 2011  . Portacath placement  12/11  . Removal portacath    . Mediastinotomy chamberlain mcneil Left 06/02/2013    Procedure: MEDIASTINOTOMY CHAMBERLAIN MCNEIL;  Surgeon: [name], MD;  Location: [place];  Service: Thoracic;  Laterality: Left;  LEFT ANTERIOR MEDIASTINOTOMY     There were no vitals filed for this visit.  Visit Diagnosis:  Lymphedema  Muscle stiffness  Pain in joint involving right pelvic  region and thigh      Subjective Assessment - 06/11/15 1025    Subjective Had a bad night last night.  Right arm had the nerve irritation, feet and legs were cramping, pelvic area was hurting, left ribs were hurting.   Currently in Pain? Yes   Pain Score 6    Pain Location Axilla   Pain Orientation Right   Pain Type Chronic pain   Pain Onset More than a month ago   Aggravating Factors  increased swelling   Pain Relieving Factors therapy   Multiple Pain Sites Yes   Pain Score 6   Pain Location Leg   Pain Orientation Right   Aggravating Factors  walking   Pain Relieving Factors therapy                         [facility] Adult PT Treatment/Exercise - 06/11/15 0001    Manual Therapy   Myofascial Release right axilla with focus at superior scar tightness; gentle release at right upper medial thigh   Manual Lymphatic Drainage (MLD) In left sidelying, posterior interaxillary anastomosis and right axillo-inguinal anastomosis; in supine, short neck, left axilla and anterior interaxillary anastomosis, right groin and axillo-inguinal anastomosis, area between right breast scars, directing toward pathways, and right upper arm.  In right sidelying, left periscapular area toward left groin.   Other Manual Therapy soft tissue work at superior and posterior aspect of right medial thigh in right sidelying for pain relief and muscle relaxation  PT Short Term Goals - 02/27/15 1011    PT SHORT TERM GOAL #1   Title pain with walking decreased >/= 25%   Time 3   Period Weeks   Status Achieved   PT SHORT TERM GOAL #2   Title pelvis stays in correct alignment for 3 weeks   Time 3   Period Weeks   Status Achieved           PT Long Term Goals - 06/07/15 1318    PT LONG TERM GOAL #1   Title indpendent with HEP   Time 6   Period Weeks   Status On-going  still learning   PT LONG TERM GOAL #2   Title pain with walking decreased >/= 80%   Time 6    Period Weeks   Status On-going   PT LONG TERM GOAL #3   Title go up and down steps with a step over step pattern   Time 6   Period Weeks   Status On-going           Long Term Clinic Goals - 06/08/15 1241    CC Long Term Goal  #1   Title Reports left upper back and left side pain is controlled at 4/10 or less with therapy at the current frequency.   Status On-going   CC Long Term Goal  #2   Title Pt. will report swelling is adequately managed to enable ADL function at a consistent level.   Status On-going            Plan - 06/11/15 1108    Clinical Impression Statement Patient had a bad night and is feeling pain in several areas today, but helped by treatment today and anticipates that pelvic floor treatment will be beneficial this week.   Pt will benefit from skilled therapeutic intervention in order to improve on the following deficits Pain;Abnormal gait;Increased muscle spasms;Increased fascial restricitons;Increased edema   Rehab Potential Good   Clinical Impairments Affecting Rehab Potential active cancer   PT Frequency 2x / week   PT Duration 12 weeks   PT Treatment/Interventions Manual lymph drainage;Manual techniques   PT Next Visit Plan for lymphedema, continue manual lymph drainage and soft tissue work, myofascial work   Oncologist with Plan of Care Patient        Problem List Patient Active Problem List   Diagnosis Date Noted  . 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 Zoster 04/04/2015  . Nausea with vomiting 11/18/2014  . Constipation 11/18/2014  . Left-sided thoracic back pain   . Bone metastases ([place]) 11/16/2014  . Back pain 11/15/2014  . Uncontrolled pain 11/14/2014  . 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 Chest wall pain 03/21/2014  . Abnormal LFTs (liver function tests) 09/12/2013  . Breast cancer of upper-inner quadrant of right female breast ([place]) 08/18/2013  . Secondary malignant neoplasm of mediastinal lymph  node ([place]) 08/18/2013    Rahi Chandonnet 06/11/2015, 11:11 AM  [place], [place], [address] Phone: [phone]   Fax:  [phone]  Name: [name] [name] MRN: [id number] Date of Birth: 05/05/1960    [name], PT 06/11/2015 11:11 AM

## 2015-06-12 ENCOUNTER — Ambulatory Visit: Payer: 59 | Admitting: Physical Therapy

## 2015-06-12 ENCOUNTER — Encounter: Payer: Self-pay | Admitting: Physical Therapy

## 2015-06-12 DIAGNOSIS — M6289 Other specified disorders of muscle: Secondary | ICD-10-CM

## 2015-06-12 DIAGNOSIS — R1031 Right lower quadrant pain: Secondary | ICD-10-CM

## 2015-06-12 DIAGNOSIS — M25551 Pain in right hip: Secondary | ICD-10-CM

## 2015-06-12 LAB — CANCER ANTIGEN 27.29

## 2015-06-12 NOTE — Therapy (Signed)
[facility] [address], [place], [place], [address] Phone: [phone]   Fax:  [phone]  Physical Therapy Treatment  Patient Details  Name: [name] [name] MRN: [id number] Date of Birth: [birth year] Referring Provider: Dr. Lurline Del  Encounter Date: 06/12/2015      PT End of Session - 06/12/15 1228    Visit Number 40  pelvis   Date for PT Re-Evaluation 06/25/15   Authorization - Visit Number 140   Authorization - Number of Visits 40   PT Start Time 1230   PT Stop Time 1308   PT Time Calculation (min) 38 min   Activity Tolerance Patient tolerated treatment well   Behavior During Therapy [facility] for tasks assessed/performed      Past Medical History  Diagnosis Date  . Seizures ([place]) 2010    Isolated incident.  Marland Kitchen PONV (postoperative nausea and vomiting)   . Peripheral vascular disease ([place]) 02/2010    blood clot related to porta cath  . S/P radiation therapy 07/17/2014 through 08/02/2014     Left mediastinum, left seventh rib 3250 cGy in 13 sessions   . S/P radiation therapy 12/11/2014 through 12/22/2014     Left parietal calvarium 2400 cGy in 8 sessions   . Breast cancer ([place]) dx'd 2005/2011  . Bone metastases ([place]) dx'd 05/2014    Past Surgical History  Procedure Laterality Date  . Breast lumpectomy  2005  . Axillary lymph node dissection  Dec. 2011  . Portacath placement  12/11  . Removal portacath    . Mediastinotomy chamberlain mcneil Left 06/02/2013    Procedure: MEDIASTINOTOMY CHAMBERLAIN MCNEIL;  Surgeon: [name], MD;  Location: [place];  Service: Thoracic;  Laterality: Left;  LEFT ANTERIOR MEDIASTINOTOMY     There were no vitals filed for this visit.  Visit Diagnosis:  Muscle stiffness  Pain in joint involving right pelvic  region and thigh  Groin pain, right      Subjective Assessment - 06/12/15 1229    Subjective My pain has kept me up at night. I feel better after therapy.    How long can you walk comfortably? make patient limp   Diagnostic tests x rays of back show no fractures;    Patient Stated Goals walk 20 minutes with minimal limp   Currently in Pain? Yes   Pain Score 7    Pain Location Groin   Pain Orientation Right   Pain Descriptors / Indicators Constant   Pain Type Chronic pain   Pain Radiating Towards down right leg   Pain Onset More than a month ago   Pain Frequency Constant   Aggravating Factors  walking   Pain Relieving Factors therapy   Effect of Pain on Daily Activities walk with a limp   Multiple Pain Sites No                      Pelvic Floor Special Questions - 06/12/15 0001    Pelvic Floor Internal Exam Patient approves physical therapist to perform pelvic floor muscle assessment   Exam Type Vaginal           [facility] Adult PT Treatment/Exercise - 06/12/15 0001    Manual Therapy   Manual Therapy Soft tissue mobilization;Internal Pelvic Floor   Internal Pelvic Floor bil. obturator internist, bil. puborectalis, bil. levaotr ani   Other Manual Therapy soft tissue work at superior and posterior aspect of right medial thigh in right sidelying for pain  relief and muscle relaxation                PT Education - 06/12/15 1309    Education provided No          PT Short Term Goals - 02/27/15 1011    PT SHORT TERM GOAL #1   Title pain with walking decreased >/= 25%   Time 3   Period Weeks   Status Achieved   PT SHORT TERM GOAL #2   Title pelvis stays in correct alignment for 3 weeks   Time 3   Period Weeks   Status Achieved           PT Long Term Goals - 06/12/15 1316    PT LONG TERM GOAL #1   Title indpendent with HEP   Time 6   Period Weeks   Status On-going   PT LONG TERM GOAL #2   Title pain with walking decreased >/= 80%    Time 6   Period Weeks   Status On-going  40% after treatment   PT LONG TERM GOAL #3   Title go up and down steps with a step over step pattern   Time 6   Period Weeks   Status On-going  has to use a cane           Long Term Clinic Goals - 06/08/15 1241    CC Long Term Goal  #1   Title Reports left upper back and left side pain is controlled at 4/10 or less with therapy at the current frequency.   Status On-going   CC Long Term Goal  #2   Title Pt. will report swelling is adequately managed to enable ADL function at a consistent level.   Status On-going            Plan - 06/12/15 1309    Clinical Impression Statement Patient reports her pain is 40% better from the beginning of therapy.  Patient has trigger points on right pelvic floor muscles. Left pelvic floor muscles have 50% less tightness than last week.  Along the right ischial tuberosity is very tender.  After therapy patient is able to walk with less of a limp.    Pt will benefit from skilled therapeutic intervention in order to improve on the following deficits Pain;Abnormal gait;Increased muscle spasms;Increased fascial restricitons;Increased edema   Rehab Potential Good   Clinical Impairments Affecting Rehab Potential active cancer   PT Duration 8 weeks   PT Treatment/Interventions Neuromuscular re-education;Patient/family education;Manual techniques;Therapeutic activities;Therapeutic exercise   PT Next Visit Plan soft tissue work   PT Home Exercise Plan progress as needed   Consulted and Agree with Plan of Care Patient   PT Plan Continue manual lymph drainage, soft tissue work.        Problem List Patient Active Problem List   Diagnosis Date Noted  . 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 Zoster 04/04/2015  . Nausea with vomiting 11/18/2014  . Constipation 11/18/2014  . Left-sided thoracic back pain   . Bone metastases ([place]) 11/16/2014  . Back pain 11/15/2014  . Uncontrolled pain 11/14/2014   . 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 Chest wall pain 03/21/2014  . Abnormal LFTs (liver function tests) 09/12/2013  . Breast cancer of upper-inner quadrant of right female breast ([place]) 08/18/2013  . 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  [address] [place], [place], [address] Phone: [phone]   Fax:  [phone]  Name: VAIBHAVI CZAPLEWSKI MRN: [id number] Date of Birth: July 16, 1960

## 2015-06-14 ENCOUNTER — Encounter: Payer: 59 | Admitting: Physical Therapy

## 2015-06-15 ENCOUNTER — Ambulatory Visit: Payer: 59 | Admitting: Physical Therapy

## 2015-06-15 DIAGNOSIS — M6289 Other specified disorders of muscle: Secondary | ICD-10-CM

## 2015-06-15 DIAGNOSIS — M25551 Pain in right hip: Secondary | ICD-10-CM

## 2015-06-15 DIAGNOSIS — M549 Dorsalgia, unspecified: Secondary | ICD-10-CM | POA: Diagnosis not present

## 2015-06-15 DIAGNOSIS — I890 Lymphedema, not elsewhere classified: Secondary | ICD-10-CM

## 2015-06-15 NOTE — Therapy (Signed)
[place], [place], [address] Phone: [phone]   Fax:  [phone]  Physical Therapy Treatment  Patient Details  Name: [name] [name] MRN: [id number] Date of Birth: [birth year] Referring Provider: Dr. Lurline Del  Encounter Date: 06/15/2015      PT End of Session - 06/15/15 1228    Visit Number 44  total   Number of Visits 24  for lymph   Date for PT Re-Evaluation 06/25/15   Authorization - Visit Number 140   PT Start Time [id number]   PT Stop Time 1102   PT Time Calculation (min) 43 min   Activity Tolerance Patient tolerated treatment well   Behavior During Therapy [facility] for tasks assessed/performed      Past Medical History  Diagnosis Date  . Seizures ([place]) 2010    Isolated incident.  Marland Kitchen PONV (postoperative nausea and vomiting)   . Peripheral vascular disease ([place]) 02/2010    blood clot related to porta cath  . S/P radiation therapy 07/17/2014 through 08/02/2014     Left mediastinum, left seventh rib 3250 cGy in 13 sessions   . S/P radiation therapy 12/11/2014 through 12/22/2014     Left parietal calvarium 2400 cGy in 8 sessions   . Breast cancer ([place]) dx'd 2005/2011  . Bone metastases ([place]) dx'd 05/2014    Past Surgical History  Procedure Laterality Date  . Breast lumpectomy  2005  . Axillary lymph node dissection  Dec. 2011  . Portacath placement  12/11  . Removal portacath    . Mediastinotomy chamberlain mcneil Left 06/02/2013    Procedure: MEDIASTINOTOMY CHAMBERLAIN MCNEIL;  Surgeon: [name], MD;  Location: [place];  Service: Thoracic;  Laterality: Left;  LEFT ANTERIOR MEDIASTINOTOMY     There were no vitals filed for this visit.  Visit Diagnosis:  Lymphedema  Muscle stiffness  Pain in joint involving right pelvic  region and thigh      Subjective Assessment - 06/15/15 1020    Subjective (p) "I'm not myself today. I cried a little bit in [employer]."  "It's my mother's birthday today."   Currently in Pain? (p) Yes   Pain Score (p) 6    Pain Location (p) Axilla   Pain Orientation (p) Right   Aggravating Factors  (p) swelling   Pain Relieving Factors (p) therapy                         [facility] Adult PT Treatment/Exercise - 06/15/15 0001    Manual Therapy   Myofascial Release right axilla with focus at superior scar tightness; gentle release at right upper medial thigh   Manual Lymphatic Drainage (MLD) In left sidelying, posterior interaxillary anastomosis and right axillo-inguinal anastomosis; in supine, short neck, left axilla and anterior interaxillary anastomosis, right groin and axillo-inguinal anastomosis, area between right breast scars, directing toward pathways, and right upper arm.  In right sidelying, left periscapular area toward left groin.   Other Manual Therapy soft tissue work at superior and posterior aspect of right medial thigh in right sidelying for pain relief and muscle relaxation                  PT Short Term Goals - 02/27/15 1011    PT SHORT TERM GOAL #1   Title pain with walking decreased >/= 25%   Time 3   Period Weeks   Status Achieved   PT SHORT TERM GOAL #2  Title pelvis stays in correct alignment for 3 weeks   Time 3   Period Weeks   Status Achieved           PT Long Term Goals - 06/12/15 1316    PT LONG TERM GOAL #1   Title indpendent with HEP   Time 6   Period Weeks   Status On-going   PT LONG TERM GOAL #2   Title pain with walking decreased >/= 80%   Time 6   Period Weeks   Status On-going  40% after treatment   PT LONG TERM GOAL #3   Title go up and down steps with a step over step pattern   Time 6   Period Weeks   Status On-going  has to use a cane           Long Term Clinic Goals - 06/15/15 1231    CC Long  Term Goal  #1   Title Reports left upper back and left side pain is controlled at 4/10 or less with therapy at the current frequency.   Status On-going   CC Long Term Goal  #2   Title Pt. will report swelling is adequately managed to enable ADL function at a consistent level.   Status On-going            Plan - 06/15/15 1229    Clinical Impression Statement Right upper outer breast swelling is continuing; less induration, but still with fullness today.  Pain problems continue to benefit from soft tissue work   Pt will benefit from skilled therapeutic intervention in order to improve on the following deficits Pain;Abnormal gait;Increased muscle spasms;Increased fascial restricitons;Increased edema   Rehab Potential Good   Clinical Impairments Affecting Rehab Potential active cancer   PT Frequency 2x / week   PT Duration 12 weeks   PT Treatment/Interventions Manual lymph drainage;Manual techniques   PT Next Visit Plan soft tissue work, manual lymph drainage   Consulted and Agree with Plan of Care Patient        Problem List Patient Active Problem List   Diagnosis Date Noted  . 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 Zoster 04/04/2015  . Nausea with vomiting 11/18/2014  . Constipation 11/18/2014  . Left-sided thoracic back pain   . Bone metastases ([place]) 11/16/2014  . Back pain 11/15/2014  . Uncontrolled pain 11/14/2014  . 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 Chest wall pain 03/21/2014  . Abnormal LFTs (liver function tests) 09/12/2013  . Breast cancer of upper-inner quadrant of right female breast ([place]) 08/18/2013  . 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

## 2015-06-18 ENCOUNTER — Other Ambulatory Visit (HOSPITAL_BASED_OUTPATIENT_CLINIC_OR_DEPARTMENT_OTHER): Payer: 59

## 2015-06-18 ENCOUNTER — Ambulatory Visit: Payer: 59 | Admitting: Physical Therapy

## 2015-06-18 ENCOUNTER — Encounter: Payer: Self-pay | Admitting: Physical Therapy

## 2015-06-18 ENCOUNTER — Telehealth: Payer: Self-pay

## 2015-06-18 DIAGNOSIS — C771 Secondary and unspecified malignant neoplasm of intrathoracic lymph nodes: Secondary | ICD-10-CM | POA: Diagnosis not present

## 2015-06-18 DIAGNOSIS — C50211 Malignant neoplasm of upper-inner quadrant of right female breast: Secondary | ICD-10-CM | POA: Diagnosis not present

## 2015-06-18 DIAGNOSIS — M25551 Pain in right hip: Secondary | ICD-10-CM

## 2015-06-18 DIAGNOSIS — R1031 Right lower quadrant pain: Secondary | ICD-10-CM | POA: Diagnosis not present

## 2015-06-18 DIAGNOSIS — M549 Dorsalgia, unspecified: Secondary | ICD-10-CM | POA: Diagnosis not present

## 2015-06-18 DIAGNOSIS — J90 Pleural effusion, not elsewhere classified: Secondary | ICD-10-CM

## 2015-06-18 DIAGNOSIS — M6289 Other specified disorders of muscle: Secondary | ICD-10-CM

## 2015-06-18 DIAGNOSIS — I890 Lymphedema, not elsewhere classified: Secondary | ICD-10-CM

## 2015-06-18 LAB — CBC WITH DIFFERENTIAL/PLATELET
BASO%: 1 % (ref 0.0–2.0)
BASOS ABS: 0 10*3/uL (ref 0.0–0.1)
EOS%: 2.7 % (ref 0.0–7.0)
Eosinophils Absolute: 0.1 10*3/uL (ref 0.0–0.5)
HCT: 36.9 % (ref 34.8–46.6)
HGB: 12.8 g/dL (ref 11.6–15.9)
LYMPH%: 17.7 % (ref 14.0–49.7)
MCH: 32.9 pg (ref 25.1–34.0)
MCHC: 34.7 g/dL (ref 31.5–36.0)
MCV: 94.9 fL (ref 79.5–101.0)
MONO#: 0.5 10*3/uL (ref 0.1–0.9)
MONO%: 13.4 % (ref 0.0–14.0)
NEUT#: 2.6 10*3/uL (ref 1.5–6.5)
NEUT%: 65.2 % (ref 38.4–76.8)
NRBC: 0 % (ref 0–0)
Platelets: 282 10*3/uL (ref 145–400)
RBC: 3.89 10*6/uL (ref 3.70–5.45)
RDW: 15.4 % — ABNORMAL HIGH (ref 11.2–14.5)
WBC: 4 10*3/uL (ref 3.9–10.3)
lymph#: 0.7 10*3/uL — ABNORMAL LOW (ref 0.9–3.3)

## 2015-06-18 NOTE — Telephone Encounter (Signed)
Patient called asking how often she can have her tumor markers checked.  Writer spoke with Dr. Lindi Adie , Dr. Jana Hakim was not available.  Writer gave patient the information- once a month or every three months depending on what MD decides with patient.  [name] [name] will see Dr. Jana Hakim on 07/04/15 and will discuss then.

## 2015-06-18 NOTE — Therapy (Signed)
[place], [place], [address] Phone: [phone]   Fax:  [phone]  Physical Therapy Treatment  Patient Details  Name: [name] [name] MRN: [id number] Date of Birth: April 02, 1960 Referring Provider: Dr. Lurline Del  Encounter Date: 06/18/2015      PT End of Session - 06/18/15 1718    Visit Number 90  total   Number of Visits 24   Date for PT Re-Evaluation 06/25/15  for lymphedema   Authorization - Visit Number 140   Authorization - Number of Visits 43   PT Start Time 1019   PT Stop Time 1100   PT Time Calculation (min) 41 min   Activity Tolerance Patient tolerated treatment well   Behavior During Therapy [facility] for tasks assessed/performed      Past Medical History  Diagnosis Date  . Seizures ([place]) 2010    Isolated incident.  Marland Kitchen PONV (postoperative nausea and vomiting)   . Peripheral vascular disease ([place]) 02/2010    blood clot related to porta cath  . S/P radiation therapy 07/17/2014 through 08/02/2014     Left mediastinum, left seventh rib 3250 cGy in 13 sessions   . S/P radiation therapy 12/11/2014 through 12/22/2014     Left parietal calvarium 2400 cGy in 8 sessions   . Breast cancer ([place]) dx'd 2005/2011  . Bone metastases ([place]) dx'd 05/2014    Past Surgical History  Procedure Laterality Date  . Breast lumpectomy  2005  . Axillary lymph node dissection  Dec. 2011  . Portacath placement  12/11  . Removal portacath    . Mediastinotomy chamberlain mcneil Left 06/02/2013    Procedure: MEDIASTINOTOMY CHAMBERLAIN MCNEIL;  Surgeon: [name], MD;  Location: [place];  Service: Thoracic;  Laterality: Left;  LEFT ANTERIOR MEDIASTINOTOMY     There were no vitals filed for this visit.  Visit Diagnosis:  Lymphedema  Muscle  stiffness  Pain in joint involving right pelvic region and thigh      Subjective Assessment - 06/18/15 1237    Subjective Pain is 8/10.   I feel like I have alot of inflammation. I do good for three days then pain will ramp up. I can walk but is a constant pain and elevated due to inflammation.    How long can you walk comfortably? make patient limp   Patient Stated Goals walk 20 minutes with minimal limp   Currently in Pain? Yes   Pain Score 8    Pain Location Groin   Pain Orientation Right   Pain Descriptors / Indicators Constant   Pain Type Chronic pain   Pain Onset More than a month ago   Pain Frequency Constant   Aggravating Factors  movement, walking, standing   Pain Relieving Factors soft tissue work   Multiple Pain Sites No            [facility] PT Assessment - 06/18/15 0001    Assessment   Medical Diagnosis perirectal rectal/perineal pain   Referring Provider Dr. Sarajane Jews Magrinat   Onset Date/Surgical Date 01/28/15   Precautions   Precautions Other (comment)   Precaution Comments No ultrasound   Balance Screen   Has the patient fallen in the past 6 months No   Has the patient had a decrease in activity level because of a fear of falling?  No   Is the patient reluctant to leave their home because of a fear of falling?  No   Prior Function   Level  of Independence Independent   Cognition   Overall Cognitive Status Within Functional Limits for tasks assessed   Strength   Overall Strength Comments bil. hip strength is 4/5; right knee strength is 4/5   Right Hip ABduction 4/5   Ambulation/Gait   Ambulation/Gait Yes   Ambulation/Gait Assistance 6: Modified independent (Device/Increase time)   Assistive device Straight cane   Gait Pattern Decreased weight shift to right;Decreased stance time - right   Ambulation Surface Level                  Pelvic Floor Special Questions - 06/18/15 0001    Pelvic Floor Internal Exam Patient approves physical therapist to  perform pelvic floor muscle assessment   Exam Type Vaginal   Strength good squeeze, good lift, able to hold agaisnt strong resistance           [facility] Adult PT Treatment/Exercise - 06/18/15 1717    Manual Therapy   Myofascial Release right axilla with focus at superior scar tightness; gentle release at right upper medial thigh   Manual Lymphatic Drainage (MLD) In left sidelying, posterior interaxillary anastomosis and right axillo-inguinal anastomosis; in supine, short neck, left axilla and anterior interaxillary anastomosis, right groin and axillo-inguinal anastomosis, area between right breast scars, directing toward pathways, and right upper arm.  In right sidelying, left periscapular area toward left groin.   Other Manual Therapy soft tissue work at superior and posterior aspect of right medial thigh in right sidelying for pain relief and muscle relaxation                PT Education - 06/18/15 1318    Education provided No          PT Short Term Goals - 02/27/15 1011    PT SHORT TERM GOAL #1   Title pain with walking decreased >/= 25%   Time 3   Period Weeks   Status Achieved   PT SHORT TERM GOAL #2   Title pelvis stays in correct alignment for 3 weeks   Time 3   Period Weeks   Status Achieved           PT Long Term Goals - 06/18/15 1319    PT LONG TERM GOAL #1   Title indpendent with HEP   Time 12   Period Weeks   Status On-going   PT LONG TERM GOAL #2   Title pain with walking decreased >/= 80%   Time 12   Period Weeks   Status On-going   PT LONG TERM GOAL #3   Title go up and down steps with a step over step pattern   Time 6   Period Weeks   Status Deferred   PT LONG TERM GOAL #4   Title being able to last for 5 days without therapy and pain not going above a 8/10 due to decreased muscle soreness   Time 12   Period Weeks   Status New           Long Term Clinic Goals - 06/15/15 1231    CC Long Term Goal  #1   Title Reports left upper  back and left side pain is controlled at 4/10 or less with therapy at the current frequency.   Status On-going   CC Long Term Goal  #2   Title Pt. will report swelling is adequately managed to enable ADL function at a consistent level.   Status On-going  Plan - 06/18/15 1719    Clinical Impression Statement Right upper outer breast swelling visibly increased today, and firm on palpation.  This softens with treatment.   Pt will benefit from skilled therapeutic intervention in order to improve on the following deficits Pain;Abnormal gait;Increased muscle spasms;Increased fascial restricitons;Increased edema   Rehab Potential Good   Clinical Impairments Affecting Rehab Potential active cancer   PT Frequency 2x / week   PT Duration 12 weeks   PT Treatment/Interventions Manual lymph drainage;Manual techniques   PT Next Visit Plan manual lymph drainage, soft tissue work for lymphedema PT   Consulted and Agree with Plan of Care Patient        Problem List Patient Active Problem List   Diagnosis Date Noted  . 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 Zoster 04/04/2015  . Nausea with vomiting 11/18/2014  . Constipation 11/18/2014  . Left-sided thoracic back pain   . Bone metastases ([place]) 11/16/2014  . Back pain 11/15/2014  . Uncontrolled pain 11/14/2014  . 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 Chest wall pain 03/21/2014  . Abnormal LFTs (liver function tests) 09/12/2013  . Breast cancer of upper-inner quadrant of right female breast ([place]) 08/18/2013  . 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

## 2015-06-18 NOTE — Therapy (Signed)
[facility] [address] [place], [place], [address] Phone: [phone]   Fax:  [phone]  Physical Therapy Treatment  Patient Details  Name: [name] [name] MRN: [id number] Date of Birth: 04-Aug-1960 Referring Provider: Dr. Lurline Del  Encounter Date: 06/18/2015      PT End of Session - 06/18/15 1239    Visit Number 42  pelvis   Date for PT Re-Evaluation 09/17/15   Authorization - Visit Number 140   Authorization - Number of Visits 42   PT Start Time 2094   PT Stop Time 1308   PT Time Calculation (min) 38 min   Activity Tolerance Patient tolerated treatment well   Behavior During Therapy [facility] for tasks assessed/performed      Past Medical History  Diagnosis Date  . Seizures ([place]) 2010    Isolated incident.  Marland Kitchen PONV (postoperative nausea and vomiting)   . Peripheral vascular disease ([place]) 02/2010    blood clot related to porta cath  . S/P radiation therapy 07/17/2014 through 08/02/2014     Left mediastinum, left seventh rib 3250 cGy in 13 sessions   . S/P radiation therapy 12/11/2014 through 12/22/2014     Left parietal calvarium 2400 cGy in 8 sessions   . Breast cancer ([place]) dx'd 2005/2011  . Bone metastases ([place]) dx'd 05/2014    Past Surgical History  Procedure Laterality Date  . Breast lumpectomy  2005  . Axillary lymph node dissection  Dec. 2011  . Portacath placement  12/11  . Removal portacath    . Mediastinotomy chamberlain mcneil Left 06/02/2013    Procedure: MEDIASTINOTOMY CHAMBERLAIN MCNEIL;  Surgeon: [name], MD;  Location: [facility];  Service: Thoracic;  Laterality: Left;  LEFT ANTERIOR MEDIASTINOTOMY     There were no vitals filed for this visit.  Visit Diagnosis:  Muscle stiffness - Plan: PT plan of care  cert/re-cert  Pain in joint involving right pelvic region and thigh - Plan: PT plan of care cert/re-cert  Groin pain, right - Plan: PT plan of care cert/re-cert      Subjective Assessment - 06/18/15 1237    Subjective Pain is 8/10.   I feel like I have alot of inflammation. I do good for three days then pain will ramp up. I can walk but is a constant pain and elevated due to inflammation.    How long can you walk comfortably? make patient limp   Patient Stated Goals walk 20 minutes with minimal limp   Currently in Pain? Yes   Pain Score 8    Pain Location Groin   Pain Orientation Right   Pain Descriptors / Indicators Constant   Pain Type Chronic pain   Pain Onset More than a month ago   Pain Frequency Constant   Aggravating Factors  movement, walking, standing   Pain Relieving Factors soft tissue work   Multiple Pain Sites No            [facility] PT Assessment - 06/18/15 0001    Assessment   Medical Diagnosis perirectal rectal/perineal pain   Referring Provider Dr. Sarajane Jews Magrinat   Onset Date/Surgical Date 01/28/15   Precautions   Precautions Other (comment)   Precaution Comments No ultrasound   Balance Screen   Has the patient fallen in the past 6 months No   Has the patient had a decrease in activity level because of a fear of falling?  No   Is the patient reluctant to leave their  home because of a fear of falling?  No   Prior Function   Level of Independence Independent   Cognition   Overall Cognitive Status Within Functional Limits for tasks assessed   Strength   Overall Strength Comments bil. hip strength is 4/5; right knee strength is 4/5   Right Hip ABduction 4/5   Ambulation/Gait   Ambulation/Gait Yes   Ambulation/Gait Assistance 6: Modified independent (Device/Increase time)   Assistive device Straight cane   Gait Pattern Decreased weight shift to right;Decreased stance time - right   Ambulation Surface Level                  Pelvic Floor  Special Questions - 06/18/15 0001    Pelvic Floor Internal Exam Patient approves physical therapist to perform pelvic floor muscle assessment   Exam Type Vaginal   Strength good squeeze, good lift, able to hold agaisnt strong resistance           [facility] Adult PT Treatment/Exercise - 06/18/15 0001    Manual Therapy   Manual Therapy Soft tissue mobilization;Internal Pelvic Floor   Soft tissue mobilization right quads, right hip   Internal Pelvic Floor bil. obturator internist, bil. puborectalis, bil. levaotr ani                PT Education - 06/18/15 1318    Education provided No          PT Short Term Goals - 02/27/15 1011    PT SHORT TERM GOAL #1   Title pain with walking decreased >/= 25%   Time 3   Period Weeks   Status Achieved   PT SHORT TERM GOAL #2   Title pelvis stays in correct alignment for 3 weeks   Time 3   Period Weeks   Status Achieved           PT Long Term Goals - 06/18/15 1319    PT LONG TERM GOAL #1   Title indpendent with HEP   Time 12   Period Weeks   Status On-going   PT LONG TERM GOAL #2   Title pain with walking decreased >/= 80%   Time 12   Period Weeks   Status On-going   PT LONG TERM GOAL #3   Title go up and down steps with a step over step pattern   Time 6   Period Weeks   Status Deferred   PT LONG TERM GOAL #4   Title being able to last for 5 days without therapy and pain not going above a 8/10 due to decreased muscle soreness   Time 12   Period Weeks   Status New           Long Term Clinic Goals - 06/15/15 1231    CC Long Term Goal  #1   Title Reports left upper back and left side pain is controlled at 4/10 or less with therapy at the current frequency.   Status On-going   CC Long Term Goal  #2   Title Pt. will report swelling is adequately managed to enable ADL function at a consistent level.   Status On-going            Plan - 06/18/15 1308    Clinical Impression Statement after therapy patient  reports pain has decreased by 50%. Right hip strength is 4/5 with pain.  Left knee strength is 4/5.  Pelvic floor strength is 4/5.  Patient has palpable tenderness located in right pelvic  floor muscles and pain decreases with limp by 50% after therapy.  Patient continues to use a cane for walking to reduce strain on right hip.  Patient has not met  goals due to recent fracture in right ilium  and cancer progressing in the pelvic area. Patient continues to benefit form physical therapy to reduce strain on right hip and pain.    Pt will benefit from skilled therapeutic intervention in order to improve on the following deficits Pain;Abnormal gait;Increased muscle spasms;Increased fascial restricitons;Increased edema   Rehab Potential Good   Clinical Impairments Affecting Rehab Potential active cancer   PT Frequency 2x / week   PT Duration 12 weeks   PT Treatment/Interventions Manual techniques;Therapeutic exercise;Therapeutic activities;ADLs/Self Care Home Management;Patient/family education   PT Next Visit Plan soft tissue work, pain management   PT Home Exercise Plan progress as needed   Consulted and Agree with Plan of Care Patient   PT Plan Continue manual lymph drainage, soft tissue work.        Problem List Patient Active Problem List   Diagnosis Date Noted  . 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 Zoster 04/04/2015  . Nausea with vomiting 11/18/2014  . Constipation 11/18/2014  . Left-sided thoracic back pain   . Bone metastases ([place]) 11/16/2014  . Back pain 11/15/2014  . Uncontrolled pain 11/14/2014  . 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 Chest wall pain 03/21/2014  . Abnormal LFTs (liver function tests) 09/12/2013  . Breast cancer of upper-inner quadrant of right female breast ([place]) 08/18/2013  . 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 [place], [place], [address] Phone: [phone]   Fax:  [phone]  Name: KRISS PERLEBERG MRN: [id number] Date of Birth: 1960-05-21

## 2015-06-19 ENCOUNTER — Encounter: Payer: 59 | Admitting: Physical Therapy

## 2015-06-21 ENCOUNTER — Ambulatory Visit: Payer: 59 | Admitting: Physical Therapy

## 2015-06-21 ENCOUNTER — Encounter: Payer: Self-pay | Admitting: Physical Therapy

## 2015-06-21 DIAGNOSIS — M25551 Pain in right hip: Secondary | ICD-10-CM

## 2015-06-21 DIAGNOSIS — R1031 Right lower quadrant pain: Secondary | ICD-10-CM | POA: Diagnosis not present

## 2015-06-21 DIAGNOSIS — M6289 Other specified disorders of muscle: Secondary | ICD-10-CM

## 2015-06-21 NOTE — Therapy (Signed)
[facility] [address], [place], [place], [address] Phone: [phone]   Fax:  [phone]  Physical Therapy Treatment  Patient Details  Name: [name] [name] MRN: [id number] Date of Birth: [birth year] Referring Provider: Dr. Lurline Del  Encounter Date: 06/21/2015      PT End of Session - 06/21/15 1238    Visit Number 29  pelvis   Date for PT Re-Evaluation 06/25/15   Authorization - Visit Number 140   Authorization - Number of Visits 73   PT Start Time [id number]   PT Stop Time 1313   PT Time Calculation (min) 38 min   Activity Tolerance Patient tolerated treatment well   Behavior During Therapy [facility] for tasks assessed/performed      Past Medical History  Diagnosis Date  . Seizures ([place]) 2010    Isolated incident.  Marland Kitchen PONV (postoperative nausea and vomiting)   . Peripheral vascular disease ([place]) 02/2010    blood clot related to porta cath  . S/P radiation therapy 07/17/2014 through 08/02/2014     Left mediastinum, left seventh rib 3250 cGy in 13 sessions   . S/P radiation therapy 12/11/2014 through 12/22/2014     Left parietal calvarium 2400 cGy in 8 sessions   . Breast cancer ([place]) dx'd 2005/2011  . Bone metastases ([place]) dx'd 05/2014    Past Surgical History  Procedure Laterality Date  . Breast lumpectomy  2005  . Axillary lymph node dissection  Dec. 2011  . Portacath placement  12/11  . Removal portacath    . Mediastinotomy chamberlain mcneil Left 06/02/2013    Procedure: MEDIASTINOTOMY CHAMBERLAIN MCNEIL;  Surgeon: [name], MD;  Location: [facility];  Service: Thoracic;  Laterality: Left;  LEFT ANTERIOR MEDIASTINOTOMY     There were no vitals filed for this visit.  Visit Diagnosis:  Muscle stiffness  Pain in joint involving right pelvic  region and thigh  Groin pain, right      Subjective Assessment - 06/21/15 1240    Subjective I cope with pain better if I have therapy every 3-4 days.   How long can you walk comfortably? make patient limp   Diagnostic tests x rays of back show no fractures;    Patient Stated Goals walk 20 minutes with minimal limp   Currently in Pain? Yes   Pain Score 5    Pain Location Groin   Pain Orientation Right   Pain Descriptors / Indicators Constant   Pain Type Chronic pain   Pain Onset More than a month ago   Pain Frequency Constant   Aggravating Factors  walking   Pain Relieving Factors soft tissue work                         [facility] Adult PT Treatment/Exercise - 06/21/15 0001    Manual Therapy   Manual Therapy Soft tissue mobilization;Internal Pelvic Floor   Soft tissue mobilization right hamsting, and hip adductor, right greater trochanter   Internal Pelvic Floor bil. levator ani, right obturator internist                  PT Short Term Goals - 02/27/15 1011    PT SHORT TERM GOAL #1   Title pain with walking decreased >/= 25%   Time 3   Period Weeks   Status Achieved   PT SHORT TERM GOAL #2   Title pelvis stays in correct alignment for 3 weeks  Time 3   Period Weeks   Status Achieved           PT Long Term Goals - 06/18/15 1319    PT LONG TERM GOAL #1   Title indpendent with HEP   Time 12   Period Weeks   Status On-going   PT LONG TERM GOAL #2   Title pain with walking decreased >/= 80%   Time 12   Period Weeks   Status On-going   PT LONG TERM GOAL #3   Title go up and down steps with a step over step pattern   Time 6   Period Weeks   Status Deferred   PT LONG TERM GOAL #4   Title being able to last for 5 days without therapy and pain not going above a 8/10 due to decreased muscle soreness   Time 12   Period Weeks   Status New           Long Term Clinic Goals - 06/15/15 1231    CC Long Term Goal  #1   Title Reports  left upper back and left side pain is controlled at 4/10 or less with therapy at the current frequency.   Status On-going   CC Long Term Goal  #2   Title Pt. will report swelling is adequately managed to enable ADL function at a consistent level.   Status On-going            Plan - 06/21/15 1307    Clinical Impression Statement pain  level decreased to 4/10.  burining decreased by 50% and not radiating.  Limp is 25% better with pain in right groin. Patient is having pain in posterior right pelvic floor.  Patient will benefit from physical therapy  to reduce pain and improve function.    Pt will benefit from skilled therapeutic intervention in order to improve on the following deficits Pain;Abnormal gait;Increased muscle spasms;Increased fascial restricitons;Increased edema   Rehab Potential Good   Clinical Impairments Affecting Rehab Potential active cancer   PT Frequency 2x / week   PT Duration 12 weeks   PT Treatment/Interventions Manual techniques   PT Next Visit Plan soft tissue work   PT Home Exercise Plan progress as needed   Consulted and Agree with Plan of Care Patient   PT Plan Continue manual lymph drainage, soft tissue work.        Problem List Patient Active Problem List   Diagnosis Date Noted  . 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 Zoster 04/04/2015  . Nausea with vomiting 11/18/2014  . Constipation 11/18/2014  . Left-sided thoracic back pain   . Bone metastases ([place]) 11/16/2014  . Back pain 11/15/2014  . Uncontrolled pain 11/14/2014  . 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 Chest wall pain 03/21/2014  . Abnormal LFTs (liver function tests) 09/12/2013  . Breast cancer of upper-inner quadrant of right female breast ([place]) 08/18/2013  . 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,  [place], [place], [address] Phone: [phone]   Fax:  [phone]  Name: ANALISSA HIRZEL MRN: [id number] Date of Birth: 17-Oct-1960

## 2015-06-22 ENCOUNTER — Ambulatory Visit: Payer: 59 | Admitting: Physical Therapy

## 2015-06-22 DIAGNOSIS — M6289 Other specified disorders of muscle: Secondary | ICD-10-CM

## 2015-06-22 DIAGNOSIS — I890 Lymphedema, not elsewhere classified: Secondary | ICD-10-CM

## 2015-06-22 DIAGNOSIS — M25551 Pain in right hip: Secondary | ICD-10-CM

## 2015-06-22 DIAGNOSIS — M549 Dorsalgia, unspecified: Secondary | ICD-10-CM | POA: Diagnosis not present

## 2015-06-22 NOTE — Therapy (Signed)
[place], [place], [address] Phone: [phone]   Fax:  [phone]  Physical Therapy Treatment  Patient Details  Name: [name] [name] MRN: [id number] Date of Birth: June 24, 1960 Referring Provider: Dr. Lurline Del  Encounter Date: 06/22/2015      PT End of Session - 06/22/15 1217    Visit Number 48  total   Number of Visits 24  lymph   Date for PT Re-Evaluation 06/25/15   Authorization - Visit Number 140   PT Start Time 1025   PT Stop Time 1108   PT Time Calculation (min) 43 min   Activity Tolerance Patient tolerated treatment well   Behavior During Therapy [facility] for tasks assessed/performed      Past Medical History  Diagnosis Date  . Seizures ([place]) 2010    Isolated incident.  Marland Kitchen PONV (postoperative nausea and vomiting)   . Peripheral vascular disease ([place]) 02/2010    blood clot related to porta cath  . S/P radiation therapy 07/17/2014 through 08/02/2014     Left mediastinum, left seventh rib 3250 cGy in 13 sessions   . S/P radiation therapy 12/11/2014 through 12/22/2014     Left parietal calvarium 2400 cGy in 8 sessions   . Breast cancer ([place]) dx'd 2005/2011  . Bone metastases ([place]) dx'd 05/2014    Past Surgical History  Procedure Laterality Date  . Breast lumpectomy  2005  . Axillary lymph node dissection  Dec. 2011  . Portacath placement  12/11  . Removal portacath    . Mediastinotomy chamberlain mcneil Left 06/02/2013    Procedure: MEDIASTINOTOMY CHAMBERLAIN MCNEIL;  Surgeon: [name], MD;  Location: [facility];  Service: Thoracic;  Laterality: Left;  LEFT ANTERIOR MEDIASTINOTOMY     There were no vitals filed for this visit.  Visit Diagnosis:  Lymphedema  Muscle stiffness  Pain in joint involving right pelvic  region and thigh      Subjective Assessment - 06/22/15 1027    Subjective "I moved wrong today."  Hurt the pelvic pain area.  Also having left back pain.   Currently in Pain? Yes   Pain Score 7    Pain Location Groin   Pain Orientation Right   Aggravating Factors  moved wrong   Pain Relieving Factors soft tissue work   Pain Score 7   Pain Location Axilla   Pain Orientation Right   Aggravating Factors  more swelling   Pain Relieving Factors less swelling, therapy                         [facility] Adult PT Treatment/Exercise - 06/22/15 0001    Manual Therapy   Myofascial Release right axilla with focus at superior scar tightness; gentle release at right upper medial thigh   Manual Lymphatic Drainage (MLD) In left sidelying, posterior interaxillary anastomosis and right axillo-inguinal anastomosis; in supine, short neck, left axilla and anterior interaxillary anastomosis, right groin and axillo-inguinal anastomosis, area between right breast scars, directing toward pathways, and right upper arm.  In right sidelying, left periscapular area toward left groin.   Other Manual Therapy soft tissue work at superior and posterior aspect of right medial thigh in right sidelying for pain relief and muscle relaxation                  PT Short Term Goals - 02/27/15 1011    PT SHORT TERM GOAL #1   Title pain with walking  decreased >/= 25%   Time 3   Period Weeks   Status Achieved   PT SHORT TERM GOAL #2   Title pelvis stays in correct alignment for 3 weeks   Time 3   Period Weeks   Status Achieved           PT Long Term Goals - 06/18/15 1319    PT LONG TERM GOAL #1   Title indpendent with HEP   Time 12   Period Weeks   Status On-going   PT LONG TERM GOAL #2   Title pain with walking decreased >/= 80%   Time 12   Period Weeks   Status On-going   PT LONG TERM GOAL #3   Title go up and down steps with a step over step pattern   Time 6   Period Weeks    Status Deferred   PT LONG TERM GOAL #4   Title being able to last for 5 days without therapy and pain not going above a 8/10 due to decreased muscle soreness   Time 12   Period Weeks   Status New           Long Term Clinic Goals - 06/22/15 1219    CC Long Term Goal  #1   Title Reports left upper back and left side pain is controlled at 4/10 or less with therapy at the current frequency.   Status On-going   CC Long Term Goal  #2   Title Pt. will report swelling is adequately managed to enable ADL function at a consistent level.   Status On-going            Plan - 06/22/15 1217    Clinical Impression Statement Right upper outer breast swelling continues to be greater than it had been in the past, but induration there does continue to soften during treatment.  Patient moved in such a way as to have had increased pain at right groin prior to session today, and soft tissue work lessened that somewhat.   Pt will benefit from skilled therapeutic intervention in order to improve on the following deficits Pain;Abnormal gait;Increased muscle spasms;Increased fascial restricitons;Increased edema   Rehab Potential Good   Clinical Impairments Affecting Rehab Potential active cancer   PT Frequency 2x / week   PT Duration 12 weeks   PT Treatment/Interventions Manual lymph drainage;Manual techniques   PT Next Visit Plan Will need lymphedema renewal next visit; soft tissue work,  manual lymph drainage   Consulted and Agree with Plan of Care Patient        Problem List Patient Active Problem List   Diagnosis Date Noted  . 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 Zoster 04/04/2015  . Nausea with vomiting 11/18/2014  . Constipation 11/18/2014  . Left-sided thoracic back pain   . Bone metastases ([place]) 11/16/2014  . Back pain 11/15/2014  . Uncontrolled pain 11/14/2014  . 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 Chest wall pain 03/21/2014  . Abnormal LFTs (liver  function tests) 09/12/2013  . Breast cancer of upper-inner quadrant of right female breast ([place]) 08/18/2013  . 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

## 2015-06-25 ENCOUNTER — Ambulatory Visit: Payer: 59 | Admitting: Physical Therapy

## 2015-06-25 ENCOUNTER — Other Ambulatory Visit (HOSPITAL_BASED_OUTPATIENT_CLINIC_OR_DEPARTMENT_OTHER): Payer: 59

## 2015-06-25 DIAGNOSIS — M549 Dorsalgia, unspecified: Secondary | ICD-10-CM | POA: Diagnosis not present

## 2015-06-25 DIAGNOSIS — M545 Low back pain, unspecified: Secondary | ICD-10-CM

## 2015-06-25 DIAGNOSIS — C771 Secondary and unspecified malignant neoplasm of intrathoracic lymph nodes: Secondary | ICD-10-CM | POA: Diagnosis not present

## 2015-06-25 DIAGNOSIS — C50211 Malignant neoplasm of upper-inner quadrant of right female breast: Secondary | ICD-10-CM | POA: Diagnosis not present

## 2015-06-25 DIAGNOSIS — I890 Lymphedema, not elsewhere classified: Secondary | ICD-10-CM

## 2015-06-25 DIAGNOSIS — M25551 Pain in right hip: Secondary | ICD-10-CM

## 2015-06-25 DIAGNOSIS — M6289 Other specified disorders of muscle: Secondary | ICD-10-CM

## 2015-06-25 DIAGNOSIS — J90 Pleural effusion, not elsewhere classified: Secondary | ICD-10-CM

## 2015-06-25 LAB — CBC WITH DIFFERENTIAL/PLATELET
BASO%: 0.8 % (ref 0.0–2.0)
BASOS ABS: 0 10*3/uL (ref 0.0–0.1)
EOS ABS: 0.1 10*3/uL (ref 0.0–0.5)
EOS%: 2.3 % (ref 0.0–7.0)
HCT: 36.2 % (ref 34.8–46.6)
HEMOGLOBIN: 12.8 g/dL (ref 11.6–15.9)
LYMPH#: 0.7 10*3/uL — AB (ref 0.9–3.3)
LYMPH%: 18.1 % (ref 14.0–49.7)
MCH: 33.4 pg (ref 25.1–34.0)
MCHC: 35.4 g/dL (ref 31.5–36.0)
MCV: 94.5 fL (ref 79.5–101.0)
MONO#: 0.3 10*3/uL (ref 0.1–0.9)
MONO%: 7.8 % (ref 0.0–14.0)
NEUT#: 2.8 10*3/uL (ref 1.5–6.5)
NEUT%: 71 % (ref 38.4–76.8)
NRBC: 0 % (ref 0–0)
Platelets: 290 10*3/uL (ref 145–400)
RBC: 3.83 10*6/uL (ref 3.70–5.45)
RDW: 14.9 % — AB (ref 11.2–14.5)
WBC: 4 10*3/uL (ref 3.9–10.3)

## 2015-06-25 NOTE — Therapy (Signed)
[place], [place], [address] Phone: [phone]   Fax:  [phone]  Physical Therapy Treatment  Patient Details  Name: [name] [name] MRN: [id number] Date of Birth: 03-10-1961 Referring Provider: Dr. Lurline Del  Encounter Date: 06/25/2015      PT End of Session - 06/25/15 1204    Visit Number 38  total   Number of Visits 48   Date for PT Re-Evaluation 09/17/15   PT Start Time 1021   PT Stop Time 1103   PT Time Calculation (min) 42 min      Past Medical History  Diagnosis Date  . Seizures ([place]) 2010    Isolated incident.  Marland Kitchen PONV (postoperative nausea and vomiting)   . Peripheral vascular disease ([place]) 02/2010    blood clot related to porta cath  . S/P radiation therapy 07/17/2014 through 08/02/2014     Left mediastinum, left seventh rib 3250 cGy in 13 sessions   . S/P radiation therapy 12/11/2014 through 12/22/2014     Left parietal calvarium 2400 cGy in 8 sessions   . Breast cancer ([place]) dx'd 2005/2011  . Bone metastases ([place]) dx'd 05/2014    Past Surgical History  Procedure Laterality Date  . Breast lumpectomy  2005  . Axillary lymph node dissection  Dec. 2011  . Portacath placement  12/11  . Removal portacath    . Mediastinotomy chamberlain mcneil Left 06/02/2013    Procedure: MEDIASTINOTOMY CHAMBERLAIN MCNEIL;  Surgeon: [name], MD;  Location: [facility];  Service: Thoracic;  Laterality: Left;  LEFT ANTERIOR MEDIASTINOTOMY     There were no vitals filed for this visit.  Visit Diagnosis:  Lymphedema - Plan: PT plan of care cert/re-cert  Muscle stiffness - Plan: PT plan of care cert/re-cert  Pain in joint involving right pelvic region and thigh - Plan: PT plan of care cert/re-cert  Intermittent low back pain -  Plan: PT plan of care cert/re-cert      Subjective Assessment - 06/25/15 1025    Subjective Saw her stepson this weekend. Did some walking on Sunday with that, and by 1:30 or 2 was feeling it.   Currently in Pain? Yes   Pain Score 7    Pain Location Groin   Pain Orientation Right   Pain Frequency Constant   Aggravating Factors  walking more   Pain Relieving Factors soft tissue work   Pain Score 7   Pain Location Axilla   Pain Orientation Right   Pain Type Chronic pain   Aggravating Factors  more swelling   Pain Relieving Factors less swelling, therapy                         [facility] Adult PT Treatment/Exercise - 06/25/15 0001    Manual Therapy   Myofascial Release right axilla with focus at superior scar tightness; gentle release at right upper medial thigh   Manual Lymphatic Drainage (MLD) In left sidelying, posterior interaxillary anastomosis and right axillo-inguinal anastomosis; in supine, short neck, left axilla and anterior interaxillary anastomosis, right groin and axillo-inguinal anastomosis, area between right breast scars, directing toward pathways, and right upper arm.  In right sidelying, left periscapular area toward left groin.   Other Manual Therapy soft tissue work at superior and posterior aspect of right medial thigh in right sidelying for pain relief and muscle relaxation  PT Short Term Goals - 02/27/15 1011    PT SHORT TERM GOAL #1   Title pain with walking decreased >/= 25%   Time 3   Period Weeks   Status Achieved   PT SHORT TERM GOAL #2   Title pelvis stays in correct alignment for 3 weeks   Time 3   Period Weeks   Status Achieved           PT Long Term Goals - 06/18/15 1319    PT LONG TERM GOAL #1   Title indpendent with HEP   Time 12   Period Weeks   Status On-going   PT LONG TERM GOAL #2   Title pain with walking decreased >/= 80%   Time 12   Period Weeks   Status On-going   PT LONG TERM GOAL  #3   Title go up and down steps with a step over step pattern   Time 6   Period Weeks   Status Deferred   PT LONG TERM GOAL #4   Title being able to last for 5 days without therapy and pain not going above a 8/10 due to decreased muscle soreness   Time 12   Period Weeks   Status New           Long Term Clinic Goals - 06/25/15 1209    CC Long Term Goal  #1   Title Reports left upper back and left side pain is controlled at 4/10 or less with therapy at the current frequency.   Baseline Difficult to control recently, with it staying in 7/10 range.   Status On-going   CC Long Term Goal  #2   Title Pt. will report swelling is adequately managed to enable ADL function at a consistent level.   Baseline This is more difficult to control, but does benefit from manual lymph drainage.   Status On-going            Plan - 06/25/15 1205    Clinical Impression Statement Right upper outer breast swelling and accompanying pain continue to be greater (in the last few weeks) than previously, but the area is palpable less indurated by the end of the treatment session. She also continues to have right groin/medial thigh pain that is exacerbated with walking and climbing stairs; this also benefits from soft tissue work in therapy.     Pt will benefit from skilled therapeutic intervention in order to improve on the following deficits Pain;Abnormal gait;Increased muscle spasms;Increased fascial restricitons;Increased edema   Rehab Potential Good   Clinical Impairments Affecting Rehab Potential active cancer   PT Frequency 2x / week  for lymphedema and pain (Cancer Rehab clinic)   PT Duration 12 weeks   PT Treatment/Interventions Manual lymph drainage;Manual techniques;Scar mobilization   PT Next Visit Plan soft tissue work, manual lymph drainage   PT Home Exercise Plan progress as needed   Consulted and Agree with Plan of Care Patient        Problem List Patient Active Problem List    Diagnosis Date Noted  . 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 Zoster 04/04/2015  . Nausea with vomiting 11/18/2014  . Constipation 11/18/2014  . Left-sided thoracic back pain   . Bone metastases ([place]) 11/16/2014  . Back pain 11/15/2014  . Uncontrolled pain 11/14/2014  . 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 Chest wall pain 03/21/2014  . Abnormal LFTs (liver function tests) 09/12/2013  . Breast cancer of upper-inner quadrant of right  female breast ([place]) 08/18/2013  . 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

## 2015-06-26 ENCOUNTER — Ambulatory Visit: Payer: 59 | Admitting: Physical Therapy

## 2015-06-26 ENCOUNTER — Encounter: Payer: Self-pay | Admitting: Physical Therapy

## 2015-06-26 DIAGNOSIS — M6289 Other specified disorders of muscle: Secondary | ICD-10-CM

## 2015-06-26 DIAGNOSIS — R1031 Right lower quadrant pain: Secondary | ICD-10-CM

## 2015-06-26 DIAGNOSIS — M25551 Pain in right hip: Secondary | ICD-10-CM

## 2015-06-26 NOTE — Therapy (Signed)
[facility] [address], [place], [place], [address] Phone: [phone]   Fax:  [phone]  Physical Therapy Treatment  Patient Details  Name: [name] [name] MRN: [id number] Date of Birth: Aug 20, 1960 Referring Provider: Dr. Lurline Del  Encounter Date: 06/26/2015      PT End of Session - 06/26/15 1310    Visit Number 41  pelvis   Date for PT Re-Evaluation 09/17/15   Authorization - Visit Number 140   Authorization - Number of Visits 74   PT Start Time 1230   PT Stop Time 1310   PT Time Calculation (min) 40 min   Activity Tolerance Patient tolerated treatment well   Behavior During Therapy [facility] for tasks assessed/performed      Past Medical History  Diagnosis Date  . Seizures ([place]) 2010    Isolated incident.  Marland Kitchen PONV (postoperative nausea and vomiting)   . Peripheral vascular disease ([place]) 02/2010    blood clot related to porta cath  . S/P radiation therapy 07/17/2014 through 08/02/2014     Left mediastinum, left seventh rib 3250 cGy in 13 sessions   . S/P radiation therapy 12/11/2014 through 12/22/2014     Left parietal calvarium 2400 cGy in 8 sessions   . Breast cancer ([place]) dx'd 2005/2011  . Bone metastases ([place]) dx'd 05/2014    Past Surgical History  Procedure Laterality Date  . Breast lumpectomy  2005  . Axillary lymph node dissection  Dec. 2011  . Portacath placement  12/11  . Removal portacath    . Mediastinotomy chamberlain mcneil Left 06/02/2013    Procedure: MEDIASTINOTOMY CHAMBERLAIN MCNEIL;  Surgeon: [name], MD;  Location: [facility];  Service: Thoracic;  Laterality: Left;  LEFT ANTERIOR MEDIASTINOTOMY     There were no vitals filed for this visit.  Visit Diagnosis:  Groin pain, right  Pain in joint involving right pelvic  region and thigh  Muscle stiffness      Subjective Assessment - 06/26/15 1231    Subjective I continue to have pain in right groin that is deep. Patient is not able to wear compression suit due to right hip pain.    Pertinent History patient vomited just prior to treatment today   How long can you walk comfortably? make patient limp   Diagnostic tests x rays of back show no fractures;    Patient Stated Goals walk 20 minutes with minimal limp   Currently in Pain? Yes   Pain Score 7    Pain Location Groin   Pain Orientation Right   Pain Descriptors / Indicators Constant   Pain Type Chronic pain   Pain Onset More than a month ago   Pain Frequency Constant   Aggravating Factors  walking   Pain Relieving Factors soft tissue work, not walking   Multiple Pain Sites No                      Pelvic Floor Special Questions - 06/26/15 0001    Pelvic Floor Internal Exam Patient approves physical therapist to perform pelvic floor muscle assessment   Exam Type Vaginal           [facility] Adult PT Treatment/Exercise - 06/26/15 0001    Manual Therapy   Manual Therapy Soft tissue mobilization;Myofascial release;Internal Pelvic Floor   Soft tissue mobilization right hamstring, rightn iliotibial band, right gluteal   Internal Pelvic Floor bil. levator ani, right obturator internist; posterior furchette, right side of  urethra                  PT Short Term Goals - 02/27/15 1011    PT SHORT TERM GOAL #1   Title pain with walking decreased >/= 25%   Time 3   Period Weeks   Status Achieved   PT SHORT TERM GOAL #2   Title pelvis stays in correct alignment for 3 weeks   Time 3   Period Weeks   Status Achieved           PT Long Term Goals - 06/26/15 1313    PT LONG TERM GOAL #1   Title indpendent with HEP   Time 12   Period Weeks   Status On-going   PT LONG TERM GOAL #2   Title pain with walking decreased >/= 80%   Time 12   Period Weeks   Status On-going    PT LONG TERM GOAL #3   Title go up and down steps with a step over step pattern   Time 6   Period Weeks   Status Deferred   PT LONG TERM GOAL #4   Title being able to last for 5 days without therapy and pain not going above a 8/10 due to decreased muscle soreness   Time 12   Period Weeks   Status New           Long Term Clinic Goals - 06/25/15 1209    CC Long Term Goal  #1   Title Reports left upper back and left side pain is controlled at 4/10 or less with therapy at the current frequency.   Baseline Difficult to control recently, with it staying in 7/10 range.   Status On-going   CC Long Term Goal  #2   Title Pt. will report swelling is adequately managed to enable ADL function at a consistent level.   Baseline This is more difficult to control, but does benefit from manual lymph drainage.   Status On-going            Plan - 06/26/15 1323    Clinical Impression Statement Patient pain decresaes after therapy for 3 days.  Patient has burning in right anterior prubic bone.  Patient had increased pain yesterday and is using the cane to walk.  Patient would benefit from physical therapy to reduce pain.    Pt will benefit from skilled therapeutic intervention in order to improve on the following deficits Pain;Abnormal gait;Increased muscle spasms;Increased fascial restricitons;Increased edema   Rehab Potential Good   Clinical Impairments Affecting Rehab Potential active cancer   PT Frequency 2x / week   PT Duration 12 weeks   PT Treatment/Interventions Manual techniques;Patient/family education   PT Next Visit Plan soft tissue work   PT Home Exercise Plan progress as needed   Consulted and Agree with Plan of Care Patient   PT Plan Continue manual lymph drainage, soft tissue work.        Problem List Patient Active Problem List   Diagnosis Date Noted  . 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 Zoster 04/04/2015  . Nausea with vomiting 11/18/2014  .  Constipation 11/18/2014  . Left-sided thoracic back pain   . Bone metastases ([place]) 11/16/2014  . Back pain 11/15/2014  . Uncontrolled pain 11/14/2014  . 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 Chest wall pain 03/21/2014  . Abnormal LFTs (liver function tests) 09/12/2013  . Breast cancer of upper-inner quadrant of right female breast ([place]) 08/18/2013  .  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 [place], [place], [address] Phone: [phone]   Fax:  [phone]  Name: [name] [name] MRN: [id number] Date of Birth: 04/07/1960

## 2015-06-28 ENCOUNTER — Encounter: Payer: Self-pay | Admitting: Physical Therapy

## 2015-06-28 ENCOUNTER — Ambulatory Visit: Payer: 59 | Admitting: Physical Therapy

## 2015-06-28 DIAGNOSIS — M25551 Pain in right hip: Secondary | ICD-10-CM

## 2015-06-28 DIAGNOSIS — M6289 Other specified disorders of muscle: Secondary | ICD-10-CM

## 2015-06-28 DIAGNOSIS — R1031 Right lower quadrant pain: Secondary | ICD-10-CM | POA: Diagnosis not present

## 2015-06-28 NOTE — Therapy (Signed)
[facility] [address], [place], [place], [address] Phone: [phone]   Fax:  [phone]  Physical Therapy Treatment  Patient Details  Name: [name] [name] MRN: [id number] Date of Birth: 1960/10/08 Referring Provider: Dr. Lurline Del  Encounter Date: 06/28/2015      PT End of Session - 06/28/15 1235    Visit Number 48  Pelvis   Date for PT Re-Evaluation 09/17/15   Authorization - Visit Number 140   Authorization - Number of Visits 48   PT Start Time 1230   PT Stop Time 1310   PT Time Calculation (min) 40 min   Activity Tolerance Patient tolerated treatment well   Behavior During Therapy [facility] for tasks assessed/performed      Past Medical History  Diagnosis Date  . Seizures ([place]) 2010    Isolated incident.  Marland Kitchen PONV (postoperative nausea and vomiting)   . Peripheral vascular disease ([place]) 02/2010    blood clot related to porta cath  . S/P radiation therapy 07/17/2014 through 08/02/2014     Left mediastinum, left seventh rib 3250 cGy in 13 sessions   . S/P radiation therapy 12/11/2014 through 12/22/2014     Left parietal calvarium 2400 cGy in 8 sessions   . Breast cancer ([place]) dx'd 2005/2011  . Bone metastases ([place]) dx'd 05/2014    Past Surgical History  Procedure Laterality Date  . Breast lumpectomy  2005  . Axillary lymph node dissection  Dec. 2011  . Portacath placement  12/11  . Removal portacath    . Mediastinotomy chamberlain mcneil Left 06/02/2013    Procedure: MEDIASTINOTOMY CHAMBERLAIN MCNEIL;  Surgeon: [name], MD;  Location: [facility];  Service: Thoracic;  Laterality: Left;  LEFT ANTERIOR MEDIASTINOTOMY     There were no vitals filed for this visit.  Visit Diagnosis:  Groin pain, right  Pain in joint involving right pelvic  region and thigh  Muscle stiffness      Subjective Assessment - 06/28/15 1236    Subjective I have pain with tightness.  Typical for not have therapy for several days.    Pertinent History patient vomited just prior to treatment today   How long can you walk comfortably? make patient limp   Diagnostic tests x rays of back show no fractures;    Patient Stated Goals walk 20 minutes with minimal limp   Currently in Pain? Yes   Pain Score 6    Pain Location Groin   Pain Orientation Right   Pain Descriptors / Indicators Constant   Pain Type Chronic pain   Pain Onset More than a month ago   Pain Frequency Constant   Aggravating Factors  walking   Pain Relieving Factors soft tissue work, not walking   Effect of Pain on Daily Activities walk with a limp   Multiple Pain Sites No                      Pelvic Floor Special Questions - 06/28/15 0001    Pelvic Floor Internal Exam Patient approves physical therapist to perform pelvic floor muscle assessment   Exam Type Vaginal           [facility] Adult PT Treatment/Exercise - 06/28/15 0001    Manual Therapy   Manual Therapy Soft tissue mobilization;Myofascial release;Internal Pelvic Floor   Soft tissue mobilization right hamstring, rightn iliotibial band, right gluteal; right hip adductor and quad   Internal Pelvic Floor bil. levator ani, right  obturator internist; posterior furchette, right side of urethra                  PT Short Term Goals - 02/27/15 1011    PT SHORT TERM GOAL #1   Title pain with walking decreased >/= 25%   Time 3   Period Weeks   Status Achieved   PT SHORT TERM GOAL #2   Title pelvis stays in correct alignment for 3 weeks   Time 3   Period Weeks   Status Achieved           PT Long Term Goals - 06/26/15 1313    PT LONG TERM GOAL #1   Title indpendent with HEP   Time 12   Period Weeks   Status On-going   PT LONG TERM GOAL #2   Title pain with walking decreased >/= 80%    Time 12   Period Weeks   Status On-going   PT LONG TERM GOAL #3   Title go up and down steps with a step over step pattern   Time 6   Period Weeks   Status Deferred   PT LONG TERM GOAL #4   Title being able to last for 5 days without therapy and pain not going above a 8/10 due to decreased muscle soreness   Time 12   Period Weeks   Status New           Long Term Clinic Goals - 06/25/15 1209    CC Long Term Goal  #1   Title Reports left upper back and left side pain is controlled at 4/10 or less with therapy at the current frequency.   Baseline Difficult to control recently, with it staying in 7/10 range.   Status On-going   CC Long Term Goal  #2   Title Pt. will report swelling is adequately managed to enable ADL function at a consistent level.   Baseline This is more difficult to control, but does benefit from manual lymph drainage.   Status On-going            Plan - 06/28/15 1309    Clinical Impression Statement Patient pain has decreased to 4/10 after therapy.  Patient able to ambulate with single point cane with no limp. Patient has tenderness located on right ischial tuberosity where she had a fracture.     Pt will benefit from skilled therapeutic intervention in order to improve on the following deficits Pain;Abnormal gait;Increased muscle spasms;Increased fascial restricitons;Increased edema   Rehab Potential Good   Clinical Impairments Affecting Rehab Potential active cancer   PT Frequency 2x / week   PT Duration 12 weeks   PT Treatment/Interventions Manual techniques;Patient/family education   PT Next Visit Plan soft tissue work   PT Home Exercise Plan progress as needed   Consulted and Agree with Plan of Care Patient   PT Plan Continue manual lymph drainage, soft tissue work.        Problem List Patient Active Problem List   Diagnosis Date Noted  . 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 Zoster 04/04/2015  . Nausea with vomiting 11/18/2014   . Constipation 11/18/2014  . Left-sided thoracic back pain   . Bone metastases ([place]) 11/16/2014  . Back pain 11/15/2014  . Uncontrolled pain 11/14/2014  . 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 Chest wall pain 03/21/2014  . Abnormal LFTs (liver function tests) 09/12/2013  . Breast cancer of upper-inner quadrant of right female breast ([place])  08/18/2013  . 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 [place], [place], [address] Phone: [phone]   Fax:  [phone]  Name: [name] [name] MRN: [id number] Date of Birth: [birth year]

## 2015-06-29 ENCOUNTER — Ambulatory Visit: Payer: 59 | Admitting: Physical Therapy

## 2015-06-29 DIAGNOSIS — I890 Lymphedema, not elsewhere classified: Secondary | ICD-10-CM

## 2015-06-29 DIAGNOSIS — M549 Dorsalgia, unspecified: Secondary | ICD-10-CM | POA: Diagnosis not present

## 2015-06-29 DIAGNOSIS — M79651 Pain in right thigh: Secondary | ICD-10-CM

## 2015-06-29 NOTE — Therapy (Deleted)
[place], [place], [address] Phone: [phone]   Fax:  [phone]  Patient Details  Name: [name] [name] MRN: [id number] Date of Birth: [birth year] Referring Provider:  Chauncey Cruel, MD  Encounter Date: 06/29/2015   [name] 06/29/2015, 12:22 PM  [place], [place], [address] Phone: [phone]   Fax:  [phone]

## 2015-06-29 NOTE — Therapy (Signed)
[place], [place], [address] Phone: [phone]   Fax:  [phone]  Physical Therapy Treatment  Patient Details  Name: [name] [name] MRN: [id number] Date of Birth: March 21, 1961 Referring Provider: Dr. Lurline Del  Encounter Date: 06/29/2015      PT End of Session - 06/29/15 1214    Visit Number 4  total   Number of Visits 18  [facility]   Date for PT Re-Evaluation 09/17/15   PT Start Time 1025   PT Stop Time 1108   PT Time Calculation (min) 43 min   Activity Tolerance Patient tolerated treatment well   Behavior During Therapy [facility] for tasks assessed/performed      Past Medical History  Diagnosis Date  . Seizures ([place]) 2010    Isolated incident.  Marland Kitchen PONV (postoperative nausea and vomiting)   . Peripheral vascular disease ([place]) 02/2010    blood clot related to porta cath  . S/P radiation therapy 07/17/2014 through 08/02/2014     Left mediastinum, left seventh rib 3250 cGy in 13 sessions   . S/P radiation therapy 12/11/2014 through 12/22/2014     Left parietal calvarium 2400 cGy in 8 sessions   . Breast cancer ([place]) dx'd 2005/2011  . Bone metastases ([place]) dx'd 05/2014    Past Surgical History  Procedure Laterality Date  . Breast lumpectomy  2005  . Axillary lymph node dissection  Dec. 2011  . Portacath placement  12/11  . Removal portacath    . Mediastinotomy chamberlain mcneil Left 06/02/2013    Procedure: MEDIASTINOTOMY CHAMBERLAIN MCNEIL;  Surgeon: [name], MD;  Location: [place];  Service: Thoracic;  Laterality: Left;  LEFT ANTERIOR MEDIASTINOTOMY     There were no vitals filed for this visit.  Visit Diagnosis:  Lymphedema  Pain in right thigh      Subjective Assessment - 06/29/15 1025    Subjective Pain levels  somewhat as they have been.   Currently in Pain? Yes   Pain Score 5   to 6   Pain Location Groin   Pain Orientation Right   Pain Descriptors / Indicators Constant   Pain Type Chronic pain   Aggravating Factors  walking   Pain Relieving Factors soft tissue work   Pain Score 6  to 7   Pain Location Axilla   Pain Orientation Right   Pain Descriptors / Indicators Other (Comment)  full   Pain Type Chronic pain   Pain Frequency Constant   Aggravating Factors  more swelling   Pain Relieving Factors less swelling, manual lymph drainage                         [facility] Adult PT Treatment/Exercise - 06/29/15 0001    Manual Therapy   Myofascial Release right axilla with focus at superior scar tightness; gentle release at right upper medial thigh   Manual Lymphatic Drainage (MLD) In left sidelying, posterior interaxillary anastomosis and right axillo-inguinal anastomosis; in supine, short neck, left axilla and anterior interaxillary anastomosis, right groin and axillo-inguinal anastomosis, area between right breast scars, directing toward pathways, and right upper arm.  In right sidelying, left periscapular area toward left groin.   Other Manual Therapy soft tissue work at superior and entire aspect of right medial thigh in right sidelying for pain relief and muscle relaxation                  PT Short Term  Goals - 02/27/15 1011    PT SHORT TERM GOAL #1   Title pain with walking decreased >/= 25%   Time 3   Period Weeks   Status Achieved   PT SHORT TERM GOAL #2   Title pelvis stays in correct alignment for 3 weeks   Time 3   Period Weeks   Status Achieved           PT Long Term Goals - 06/26/15 1313    PT LONG TERM GOAL #1   Title indpendent with HEP   Time 12   Period Weeks   Status On-going   PT LONG TERM GOAL #2   Title pain with walking decreased >/= 80%   Time 12   Period Weeks   Status On-going   PT LONG TERM GOAL #3   Title go up and down  steps with a step over step pattern   Time 6   Period Weeks   Status Deferred   PT LONG TERM GOAL #4   Title being able to last for 5 days without therapy and pain not going above a 8/10 due to decreased muscle soreness   Time 12   Period Weeks   Status New           Long Term Clinic Goals - 06/29/15 1221    CC Long Term Goal  #1   Title Reports left upper back and left side pain is controlled at 4/10 or less with therapy at the current frequency.   Status On-going   CC Long Term Goal  #2   Title Pt. will report swelling is adequately managed to enable ADL function at a consistent level.   Status On-going            Plan - 06/29/15 1218    Clinical Impression Statement In a little less discomfort today.  Right upper outer breast continues to have significant swelling.  Right leg soft tissue feels tight at medial thigh, and with an easily palpable knot at upper medial thigh; tightness is helped with soft tissue work   Pt will benefit from skilled therapeutic intervention in order to improve on the following deficits Pain;Abnormal gait;Increased muscle spasms;Increased fascial restricitons;Increased edema   Rehab Potential Good   Clinical Impairments Affecting Rehab Potential active cancer   PT Frequency 2x / week   PT Duration 12 weeks   PT Treatment/Interventions Manual lymph drainage;Manual techniques   PT Next Visit Plan soft tissue work, manual lymph drainage   Consulted and Agree with Plan of Care Patient        Problem List Patient Active Problem List   Diagnosis Date Noted  . 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 Zoster 04/04/2015  . Nausea with vomiting 11/18/2014  . Constipation 11/18/2014  . Left-sided thoracic back pain   . Bone metastases ([place]) 11/16/2014  . Back pain 11/15/2014  . Uncontrolled pain 11/14/2014  . 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 Chest wall pain 03/21/2014  . Abnormal LFTs (liver function tests)  09/12/2013  . Breast cancer of upper-inner quadrant of right female breast ([place]) 08/18/2013  . 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

## 2015-07-02 ENCOUNTER — Ambulatory Visit: Payer: 59 | Attending: Oncology | Admitting: Physical Therapy

## 2015-07-02 ENCOUNTER — Other Ambulatory Visit (HOSPITAL_BASED_OUTPATIENT_CLINIC_OR_DEPARTMENT_OTHER): Payer: 59

## 2015-07-02 DIAGNOSIS — C771 Secondary and unspecified malignant neoplasm of intrathoracic lymph nodes: Secondary | ICD-10-CM

## 2015-07-02 DIAGNOSIS — R2689 Other abnormalities of gait and mobility: Secondary | ICD-10-CM | POA: Insufficient documentation

## 2015-07-02 DIAGNOSIS — M79651 Pain in right thigh: Secondary | ICD-10-CM | POA: Insufficient documentation

## 2015-07-02 DIAGNOSIS — I890 Lymphedema, not elsewhere classified: Secondary | ICD-10-CM | POA: Insufficient documentation

## 2015-07-02 DIAGNOSIS — C50211 Malignant neoplasm of upper-inner quadrant of right female breast: Secondary | ICD-10-CM

## 2015-07-02 DIAGNOSIS — J90 Pleural effusion, not elsewhere classified: Secondary | ICD-10-CM

## 2015-07-02 LAB — CBC WITH DIFFERENTIAL/PLATELET
BASO%: 1.3 % (ref 0.0–2.0)
Basophils Absolute: 0 10*3/uL (ref 0.0–0.1)
EOS%: 2.3 % (ref 0.0–7.0)
Eosinophils Absolute: 0.1 10*3/uL (ref 0.0–0.5)
HEMATOCRIT: 38.1 % (ref 34.8–46.6)
HEMOGLOBIN: 12.9 g/dL (ref 11.6–15.9)
LYMPH#: 0.7 10*3/uL — AB (ref 0.9–3.3)
LYMPH%: 20.3 % (ref 14.0–49.7)
MCH: 33.5 pg (ref 25.1–34.0)
MCHC: 34 g/dL (ref 31.5–36.0)
MCV: 98.6 fL (ref 79.5–101.0)
MONO#: 0.3 10*3/uL (ref 0.1–0.9)
MONO%: 8.9 % (ref 0.0–14.0)
NEUT%: 67.2 % (ref 38.4–76.8)
NEUTROS ABS: 2.3 10*3/uL (ref 1.5–6.5)
Platelets: 346 10*3/uL (ref 145–400)
RBC: 3.86 10*6/uL (ref 3.70–5.45)
RDW: 16 % — AB (ref 11.2–14.5)
WBC: 3.5 10*3/uL — AB (ref 3.9–10.3)

## 2015-07-02 LAB — COMPREHENSIVE METABOLIC PANEL
ALBUMIN: 4 g/dL (ref 3.5–5.0)
ALK PHOS: 76 U/L (ref 40–150)
ALT: 32 U/L (ref 0–55)
AST: 25 U/L (ref 5–34)
Anion Gap: 7 mEq/L (ref 3–11)
BUN: 11.4 mg/dL (ref 7.0–26.0)
CALCIUM: 9.8 mg/dL (ref 8.4–10.4)
CO2: 29 mEq/L (ref 22–29)
CREATININE: 0.8 mg/dL (ref 0.6–1.1)
Chloride: 104 mEq/L (ref 98–109)
EGFR: 78 mL/min/{1.73_m2} — ABNORMAL LOW (ref >90)
GLUCOSE: 68 mg/dL — AB (ref 70–140)
Potassium: 4.4 mEq/L (ref 3.5–5.1)
Sodium: 139 mEq/L (ref 136–145)
Total Bilirubin: 0.3 mg/dL (ref 0.20–1.20)
Total Protein: 7.6 g/dL (ref 6.4–8.3)

## 2015-07-02 NOTE — Therapy (Signed)
[place], [place], [address] Phone: [phone]   Fax:  [phone]  Physical Therapy Treatment  Patient Details  Name: [name] [name] MRN: [id number] Date of Birth: April 08, 1960 Referring Provider: Dr. Lurline Del  Encounter Date: 07/02/2015      PT End of Session - 07/02/15 1711    Visit Number 50  total   Number of Visits 9  cancer rehab   Date for PT Re-Evaluation 09/17/15   PT Start Time [id number]  pt. late   PT Stop Time 1100   PT Time Calculation (min) 37 min   Activity Tolerance Patient tolerated treatment well   Behavior During Therapy [facility] for tasks assessed/performed      Past Medical History  Diagnosis Date  . Seizures ([place]) 2010    Isolated incident.  Marland Kitchen PONV (postoperative nausea and vomiting)   . Peripheral vascular disease ([place]) 02/2010    blood clot related to porta cath  . S/P radiation therapy 07/17/2014 through 08/02/2014     Left mediastinum, left seventh rib 3250 cGy in 13 sessions   . S/P radiation therapy 12/11/2014 through 12/22/2014     Left parietal calvarium 2400 cGy in 8 sessions   . Breast cancer ([place]) dx'd 2005/2011  . Bone metastases ([place]) dx'd 05/2014    Past Surgical History  Procedure Laterality Date  . Breast lumpectomy  2005  . Axillary lymph node dissection  Dec. 2011  . Portacath placement  12/11  . Removal portacath    . Mediastinotomy chamberlain mcneil Left 06/02/2013    Procedure: MEDIASTINOTOMY CHAMBERLAIN MCNEIL;  Surgeon: [name], MD;  Location: [place];  Service: Thoracic;  Laterality: Left;  LEFT ANTERIOR MEDIASTINOTOMY     There were no vitals filed for this visit.  Visit Diagnosis:  Lymphedema, not elsewhere classified  Pain in right thigh  Other abnormalities of gait  and mobility      Subjective Assessment - 07/02/15 1024    Subjective "It's really full today."   Currently in Pain? Yes   Pain Score 6    Pain Location Groin   Pain Orientation Right   Pain Descriptors / Indicators Constant  tight   Pain Type Chronic pain   Aggravating Factors  walking   Pain Relieving Factors soft tissue work   Multiple Pain Sites Yes   Pain Score 6   Pain Location Axilla   Pain Orientation Right   Pain Descriptors / Indicators --  full   Aggravating Factors  more swelling   Pain Relieving Factors therapy                         [facility] Adult PT Treatment/Exercise - 07/02/15 0001    Manual Therapy   Myofascial Release right axilla with focus on scar tightness   Manual Lymphatic Drainage (MLD) In left sidelying, posterior interaxillary anastomosis and right axillo-inguinal anastomosis; in supine, short neck, left axilla and anterior interaxillary anastomosis, right groin and axillo-inguinal anastomosis, area between right breast scars, directing toward pathways, and right upper arm.  In right sidelying, left periscapular area toward left groin.   Other Manual Therapy soft tissue work at superior and entire aspect of right medial thigh in right sidelying for pain relief and muscle relaxation                  PT Short Term Goals - 02/27/15 1011    PT SHORT TERM GOAL #1  Title pain with walking decreased >/= 25%   Time 3   Period Weeks   Status Achieved   PT SHORT TERM GOAL #2   Title pelvis stays in correct alignment for 3 weeks   Time 3   Period Weeks   Status Achieved           PT Long Term Goals - 06/26/15 1313    PT LONG TERM GOAL #1   Title indpendent with HEP   Time 12   Period Weeks   Status On-going   PT LONG TERM GOAL #2   Title pain with walking decreased >/= 80%   Time 12   Period Weeks   Status On-going   PT LONG TERM GOAL #3   Title go up and down steps with a step over step pattern   Time 6    Period Weeks   Status Deferred   PT LONG TERM GOAL #4   Title being able to last for 5 days without therapy and pain not going above a 8/10 due to decreased muscle soreness   Time 12   Period Weeks   Status New           Long Term Clinic Goals - 06/29/15 1221    CC Long Term Goal  #1   Title Reports left upper back and left side pain is controlled at 4/10 or less with therapy at the current frequency.   Status On-going   CC Long Term Goal  #2   Title Pt. will report swelling is adequately managed to enable ADL function at a consistent level.   Status On-going            Plan - 07/02/15 1712    Clinical Impression Statement Problem areas a little less full and/or tight today.   Pt will benefit from skilled therapeutic intervention in order to improve on the following deficits Pain;Abnormal gait;Increased muscle spasms;Increased fascial restricitons;Increased edema   Rehab Potential Good   Clinical Impairments Affecting Rehab Potential active cancer   PT Frequency 2x / week   PT Duration 12 weeks   PT Treatment/Interventions Manual lymph drainage;Manual techniques   PT Next Visit Plan soft tissue work, manual lymph drainage   Consulted and Agree with Plan of Care Patient        Problem List Patient Active Problem List   Diagnosis Date Noted  . 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 Zoster 04/04/2015  . Nausea with vomiting 11/18/2014  . Constipation 11/18/2014  . Left-sided thoracic back pain   . Bone metastases ([place]) 11/16/2014  . Back pain 11/15/2014  . Uncontrolled pain 11/14/2014  . 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 Chest wall pain 03/21/2014  . Abnormal LFTs (liver function tests) 09/12/2013  . Breast cancer of upper-inner quadrant of right female breast ([place]) 08/18/2013  . 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

## 2015-07-03 ENCOUNTER — Encounter: Payer: Self-pay | Admitting: Physical Therapy

## 2015-07-03 ENCOUNTER — Ambulatory Visit: Payer: 59 | Attending: Oncology | Admitting: Physical Therapy

## 2015-07-03 DIAGNOSIS — M25551 Pain in right hip: Secondary | ICD-10-CM | POA: Diagnosis not present

## 2015-07-03 DIAGNOSIS — R1031 Right lower quadrant pain: Secondary | ICD-10-CM | POA: Diagnosis not present

## 2015-07-03 DIAGNOSIS — M62838 Other muscle spasm: Secondary | ICD-10-CM | POA: Diagnosis present

## 2015-07-03 DIAGNOSIS — M79651 Pain in right thigh: Secondary | ICD-10-CM | POA: Diagnosis not present

## 2015-07-03 NOTE — Therapy (Signed)
[name] [address] [place], [place], [address] Phone: [phone]   Fax:  [phone]  Physical Therapy Treatment  Patient Details  Name: [name] [name] MRN: [id number] Date of Birth: [birth year] Referring Provider: Dr. Lurline Del  Encounter Date: 07/03/2015      PT End of Session - 07/03/15 1308    Visit Number 51  pelvis   Date for PT Re-Evaluation 09/17/15   Authorization - Visit Number 140   Authorization - Number of Visits 36   PT Start Time 1230   PT Stop Time 1310   PT Time Calculation (min) 40 min   Activity Tolerance Patient tolerated treatment well   Behavior During Therapy [facility] for tasks assessed/performed      Past Medical History  Diagnosis Date  . Seizures ([place]) 2010    Isolated incident.  Marland Kitchen PONV (postoperative nausea and vomiting)   . Peripheral vascular disease ([place]) 02/2010    blood clot related to porta cath  . S/P radiation therapy 07/17/2014 through 08/02/2014     Left mediastinum, left seventh rib 3250 cGy in 13 sessions   . S/P radiation therapy 12/11/2014 through 12/22/2014     Left parietal calvarium 2400 cGy in 8 sessions   . Breast cancer ([place]) dx'd 2005/2011  . Bone metastases ([place]) dx'd 05/2014    Past Surgical History  Procedure Laterality Date  . Breast lumpectomy  2005  . Axillary lymph node dissection  Dec. 2011  . Portacath placement  12/11  . Removal portacath    . Mediastinotomy chamberlain mcneil Left 06/02/2013    Procedure: MEDIASTINOTOMY CHAMBERLAIN MCNEIL;  Surgeon: [name], MD;  Location: [place];  Service: Thoracic;  Laterality: Left;  LEFT ANTERIOR MEDIASTINOTOMY     There were no vitals filed for this visit.  Visit Diagnosis:  Pain in right thigh  Groin pain, right       Subjective Assessment - 07/03/15 1238    Subjective I feel swollen in the area.  Dr. Jana Hakim does not want a x-ray yet.   How long can you walk comfortably? make patient limp   Diagnostic tests x rays of back show no fractures;    Patient Stated Goals walk 20 minutes with minimal limp   Currently in Pain? Yes   Pain Score 7    Pain Location Groin   Pain Orientation Right   Pain Descriptors / Indicators Constant   Pain Type Chronic pain   Pain Onset More than a month ago   Pain Frequency Constant   Aggravating Factors  walikng and moving leg in bed   Pain Relieving Factors soft tissue work   Effect of Pain on Daily Activities walk with a limp   Multiple Pain Sites No                         [facility] Adult PT Treatment/Exercise - 07/03/15 0001    Manual Therapy   Manual Therapy Soft tissue mobilization;Internal Pelvic Floor   Soft tissue mobilization right hamstring, rightn iliotibial band, right gluteal; right hip adductor and quad   Internal Pelvic Floor bil. levator ani, right obturator internist; posterior furchette, right side of urethra                PT Education - 07/03/15 1308    Education provided No          PT Short Term Goals - 02/27/15 1011  PT SHORT TERM GOAL #1   Title pain with walking decreased >/= 25%   Time 3   Period Weeks   Status Achieved   PT SHORT TERM GOAL #2   Title pelvis stays in correct alignment for 3 weeks   Time 3   Period Weeks   Status Achieved           PT Long Term Goals - 07/03/15 1310    PT LONG TERM GOAL #1   Title indpendent with HEP   Time 12   Period Weeks   PT LONG TERM GOAL #2   Time 12   Period Weeks   Status On-going   PT LONG TERM GOAL #3   Title go up and down steps with a step over step pattern   Time 6   Period Weeks   Status Deferred   PT LONG TERM GOAL #4   Title being able to last for 5 days without therapy and pain not going above a 8/10 due to decreased muscle soreness    Time 12   Period Weeks   Status New           Long Term Clinic Goals - 06/29/15 1221    CC Long Term Goal  #1   Title Reports left upper back and left side pain is controlled at 4/10 or less with therapy at the current frequency.   Status On-going   CC Long Term Goal  #2   Title Pt. will report swelling is adequately managed to enable ADL function at a consistent level.   Status On-going            Plan - 07/03/15 1308    Clinical Impression Statement Patient has less tightness in pelvic area.  Patient has less of a limp.  Patient pain decreased to 3/10 after treatment. Patient will benefit for physical therapy to reduce pain and improve function.   Pt will benefit from skilled therapeutic intervention in order to improve on the following deficits Pain;Abnormal gait;Increased muscle spasms;Increased fascial restricitons;Increased edema   Rehab Potential Good   Clinical Impairments Affecting Rehab Potential active cancer   PT Frequency 2x / week   PT Duration 12 weeks   PT Treatment/Interventions Manual techniques   PT Home Exercise Plan progress as needed   Consulted and Agree with Plan of Care Patient   PT Plan Continue manual lymph drainage, soft tissue work.        Problem List Patient Active Problem List   Diagnosis Date Noted  . 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 Zoster 04/04/2015  . Nausea with vomiting 11/18/2014  . Constipation 11/18/2014  . Left-sided thoracic back pain   . Bone metastases ([place]) 11/16/2014  . Back pain 11/15/2014  . Uncontrolled pain 11/14/2014  . 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 Chest wall pain 03/21/2014  . Abnormal LFTs (liver function tests) 09/12/2013  . Breast cancer of upper-inner quadrant of right female breast ([place]) 08/18/2013  . 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, [place], [place], [address] Phone: [phone]   Fax:  [phone]  Name: [name] [name] MRN: [id number] Date of Birth: 1961/02/07

## 2015-07-04 ENCOUNTER — Ambulatory Visit: Payer: 59 | Admitting: Physical Therapy

## 2015-07-04 ENCOUNTER — Telehealth: Payer: Self-pay | Admitting: Oncology

## 2015-07-04 ENCOUNTER — Ambulatory Visit (HOSPITAL_BASED_OUTPATIENT_CLINIC_OR_DEPARTMENT_OTHER): Payer: 59 | Admitting: Oncology

## 2015-07-04 ENCOUNTER — Ambulatory Visit (HOSPITAL_BASED_OUTPATIENT_CLINIC_OR_DEPARTMENT_OTHER): Payer: 59

## 2015-07-04 DIAGNOSIS — C771 Secondary and unspecified malignant neoplasm of intrathoracic lymph nodes: Secondary | ICD-10-CM | POA: Diagnosis not present

## 2015-07-04 DIAGNOSIS — I890 Lymphedema, not elsewhere classified: Secondary | ICD-10-CM | POA: Diagnosis not present

## 2015-07-04 DIAGNOSIS — C50211 Malignant neoplasm of upper-inner quadrant of right female breast: Secondary | ICD-10-CM

## 2015-07-04 DIAGNOSIS — Z5111 Encounter for antineoplastic chemotherapy: Secondary | ICD-10-CM | POA: Diagnosis not present

## 2015-07-04 DIAGNOSIS — M79651 Pain in right thigh: Secondary | ICD-10-CM

## 2015-07-04 DIAGNOSIS — C7951 Secondary malignant neoplasm of bone: Secondary | ICD-10-CM

## 2015-07-04 MED ORDER — DENOSUMAB 120 MG/1.7ML ~~LOC~~ SOLN
120.0000 mg | Freq: Once | SUBCUTANEOUS | Status: AC
  Administered 2015-07-04: 120 mg via SUBCUTANEOUS
  Filled 2015-07-04: qty 1.7

## 2015-07-04 MED ORDER — FULVESTRANT 250 MG/5ML IM SOLN
500.0000 mg | Freq: Once | INTRAMUSCULAR | Status: AC
  Administered 2015-07-04: 500 mg via INTRAMUSCULAR
  Filled 2015-07-04: qty 10

## 2015-07-04 MED ORDER — PALBOCICLIB 75 MG PO CAPS: 75.0000 mg | ORAL_CAPSULE | Freq: Every day | ORAL | Status: DC

## 2015-07-04 NOTE — Telephone Encounter (Signed)
appt made and avs printed °

## 2015-07-04 NOTE — Progress Notes (Signed)
[place]  [phone]) [phone] [phone]) [phone]     ID: [name] [name] OB: Mar 28, 1961  MR#: [id number]  QMV#:[id number]  PCP: Pcp Not In System GYN:  [name] SU:  OTHER MD: [name], [name], [name], [name], [name]  CHIEF COMPLAINT: Stage IV breast cancer  CURRENT TREATMENT: Fulvestrant, denosumab, palbociclib  BREAST CANCER HISTORY: From doctor [name]'s intake note 03/20/2004:  "The patient is a very pleasant 55 year old female, without significant past medical history.  Her family history is significant for a sister who at age 49 was diagnosed with invasive ductal carcinoma.  She is a breast cancer survivor at age 69 now.  The patient states that she has never really had a screening mammogram until October 2005, when she felt that it was time for her to start having mammograms done on a yearly basis.  Therefore, on 01/26/04, she underwent a screening mammogram and an abnormality was detected in the upper outer right breast.  She, therefore, underwent spot compression views of both the right and the left breast.  The left breast revealed a well-defined mass in the upper outer left quadrant, present at the 2 o'clock position, measuring 1.8 cm, 6 cm from the nipple.  This, by ultrasound, was felt to be a simple cyst measuring 1.8 cm.  On the right breast, a spiculated mass was noted in the upper outer right quadrant.  The ultrasound revealed a shadowing irregular solid mass at the 10:30 position, 9 cm from the nipple, measuring 1.2 cm in greatest dimension, correlating with the spiculated mass seen on the mammogram.  The right axilla was negative ultrasonically.  Because of this, the patient underwent a needle biopsy of the right breast and the biopsy was positive invasive mammary carcinoma that showed features consistent with a high-grade invasive ductal carcinoma associated with desmoplastic stroma.  No in  situ component was seen and no definite lymphovascular invasion was identified.  On the core biopsy, the tumor measured about 0.8 cm.  Because of this, she was seen by Dr. [name] and the patient was taken to the [facility] on March 15, 2004.  She underwent a right breast lumpectomy with sentinel node biopsy.  The final pathology revealed an invasive ductal carcinoma, measuring 1.7 cm, grade 2 of 3.  Margins were free of tumor.  Atypical lobular hyperplasia was noted.  One sentinel node was removed which was negative for metastatic disease.  The tumor was staged at T1c, N0 MX.  It was estrogen receptor positive, progesterone receptor positive.  HER-2/neu was 2+.  FISH was negative.  All margins were free of tumor.  She is now seen in Medical Oncology for further evaluation and management of this newly diagnosed T1c, node negative, stage I, invasive ductal carcinoma of the right breast."  Her subsequent history is as detailed below  INTERVAL HISTORY: [name] [name] returns today for follow-up of her stage IV estrogen receptor positive breast cancer accompanied by her husband [name] [name]. She continues on the eye brands which she is taking at 75 mg every other day. Her counts have remained very stable. She has had no liver function test abnormalities. She is also on fulvestrant and denosumab monthly. After each denosumab treatment she gets a migraine. She does take Aleve for that and it helps. She does not want to take any other pain medicine and does not want to take Aleve in any other location because of concerns regarding esophagitis.  REVIEW OF SYiSTEMS: Joesphine's  worst problem at this point is the pelvic pain. This keeps her from walking which is what she really enjoys for exercise. It also keeps her in the house. It's hard for her to go out into the fresh hair and [name] has to drive her around just so she can be outside some. She does use a cane with helps. The physical therapy which she receives on Tuesdays and  Thursdays also helps. She gets lymphedema therapy on Mondays and Fridays. Aside from those issues she is very queasy in the morning, and has to have a little cough even a little water then finally by 9 she can have breakfast after that she eats normally. She has had no vomiting. Her memory is poor she has to make notes of everything. She forgot to take her Tums yesterday for example. She finds it hard to focus. She still drives however. Her bowel movements are very irregular but the overall pattern has not changed. Also she denies any bladder problems. Aside from these issues a detailed review of systems today was stable  PAST MEDICAL HISTORY: Past Medical History  Diagnosis Date  . Seizures ([name] [name]) 2010    Isolated incident.  Marland Kitchen PONV (postoperative nausea and vomiting)   . Peripheral vascular disease ([place]) 02/2010    blood clot related to porta cath  . S/P radiation therapy 07/17/2014 through 08/02/2014     Left mediastinum, left seventh rib 3250 cGy in 13 sessions   . S/P radiation therapy 12/11/2014 through 12/22/2014     Left parietal calvarium 2400 cGy in 8 sessions   . Breast cancer ([place]) dx'd 2005/2011  . Bone metastases ([place]) dx'd 05/2014    PAST SURGICAL HISTORY: Past Surgical History  Procedure Laterality Date  . Breast lumpectomy  2005  . Axillary lymph node dissection  Dec. 2011  . Portacath placement  12/11  . Removal portacath    . Mediastinotomy chamberlain mcneil Left 06/02/2013    Procedure: MEDIASTINOTOMY CHAMBERLAIN MCNEIL;  Surgeon: [name], MD;  Location: [facility] OR;  Service: Thoracic;  Laterality: Left;  LEFT ANTERIOR MEDIASTINOTOMY     FAMILY HISTORY Family History  Problem Relation Age of Onset  . COPD Mother   . Breast cancer Sister 49   The patient's father is living, 12 years old as of may 2015. He  lives in [place]. The patient's mother died from complications of COPD at the age of 43. These has 2 brothers, one sister. Her sister developed breast cancer at the age of 73. She is doing well. The patient herself underwent genetic testing at [facility] in 2011 and was found to be BRCA negative  GYNECOLOGIC HISTORY:  Menarche age 63, she is GX P0. She stopped having periods with her initial chemotherapy in 2006.  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 Her husband [name] is a Medical illustrator in [place]. He has a child from a prior marriage. At home they have 2 rescue dogs, Hobo and Linton. The patient is religious but not a church attender    ADVANCED DIRECTIVES: In place; at the 08/04/2014 visit in particular the patient was very clear, with her husband present, that she would not want any kind of feeding tubes or "other tubes" if her condition deteriorated.   HEALTH MAINTENANCE: Social History  Substance Use Topics  . Smoking status: Never Smoker   . Smokeless tobacco: Never Used  . Alcohol Use: No  Colonoscopy:  PAP:  Bone density: March 2015; mild osteopenia  Lipid panel:  Allergies  Allergen Reactions  . 2nd Skin Quick Heal Other (See Comments)    Other Reaction: Skin peels  . Decadron [Dexamethasone] Other (See Comments)    Patient does not tolerate steroids.   . Dilaudid [Hydromorphone] Nausea And Vomiting  . Enoxaparin Other (See Comments)    unknown  . Fluconazole Swelling    Liver toxicity  . Hydromorphone Hcl Nausea And Vomiting  . Morphine And Related Nausea And Vomiting  . Protonix [Pantoprazole Sodium] Other (See Comments)    Patient reports it caused thrush.  . Tegaderm Ag Mesh [Silver]     Current Outpatient Prescriptions  Medication Sig Dispense Refill  . ALPRAZolam (XANAX) 0.5 MG tablet Take 1 tablet (0.5 mg total) by mouth 2 (two) times daily as needed for anxiety. 30 tablet 0   . B Complex-C (B-COMPLEX WITH VITAMIN C) tablet Take 1 tablet by mouth daily. Reported on 03/27/2015    . calcium carbonate (TUMS - DOSED IN MG ELEMENTAL CALCIUM) 500 MG chewable tablet Chew 1 tablet by mouth as directed.    . cholecalciferol 2000 UNITS tablet Take 1 tablet (2,000 Units total) by mouth daily.    . Diphenhyd-Hydrocort-Nystatin (FIRST-DUKES MOUTHWASH) SUSP 5-10 ml qid SWISH AND SPIT 676 mL 3  . folic acid (FOLVITE) 1 MG tablet Take 1 tablet (1 mg total) by mouth daily.    . magnesium chloride (SLOW-MAG) 64 MG TBEC SR tablet Take 60 mg by mouth daily.    . Melatonin 3 MG TABS Take 3 mg by mouth at bedtime.    . naproxen sodium (ANAPROX) 220 MG tablet Take 220 mg by mouth 2 (two) times daily with a meal.    . nystatin (MYCOSTATIN) 100000 UNIT/ML suspension Take 5 mLs (500,000 Units total) by mouth 4 (four) times daily. 240 mL 3  . palbociclib (IBRANCE) 75 MG capsule Take 1 capsule (75 mg total) by mouth daily with breakfast. Take whole with food. 21 capsule 6  . saccharomyces boulardii (FLORASTOR) 250 MG capsule Take 250 mg by mouth daily.      No current facility-administered medications for this visit.    OBJECTIVE: Middle-aged white woman Who appears stated age There were no vitals filed for this visit.   There is no weight on file to calculate BMI.   There were no vitals filed for this visit.   Patient refused vitals today 07/04/2015     ECOG FS: 2  Sclerae unicteric, EOMs intact Oropharynx clear, dentition in good repair No cervical or supraclavicular adenopathy Lungs no rales or rhonchi Heart regular rate and rhythm Abd soft, nontender, positive bowel sounds MSK straight leg raising to 30 on the right, to 80 on the left. Neuro: nonfocal, and specifically 5 over 5 bilateral dorsiflexion; well oriented, appropriate affect Breasts: Deferred    LAB RESULTS:   CMP     Component Value Date/Time   NA 139 07/02/2015 1146   NA 140 12/14/2014 0800   K 4.4  07/02/2015 1146   K 3.6 12/14/2014 0800   CL 103 12/14/2014 0800   CL 105 05/06/2012 1333   CO2 29 07/02/2015 1146   CO2 28 12/14/2014 0800   GLUCOSE 68* 07/02/2015 1146   GLUCOSE 100* 12/14/2014 0800   GLUCOSE 124* 05/06/2012 1333   BUN 11.4 07/02/2015 1146   BUN 6 12/14/2014 0800   CREATININE 0.8 07/02/2015 1146   CREATININE 0.64 12/14/2014 0800   CALCIUM 9.8  07/02/2015 1146   CALCIUM 9.6 12/14/2014 0800   PROT 7.6 07/02/2015 1146   PROT 5.6* 11/20/2014 0433   ALBUMIN 4.0 07/02/2015 1146   ALBUMIN 2.6* 11/20/2014 0433   AST 25 07/02/2015 1146   AST 28 11/20/2014 0433   ALT 32 07/02/2015 1146   ALT 62* 11/20/2014 0433   ALKPHOS 76 07/02/2015 1146   ALKPHOS 105 11/20/2014 0433   BILITOT 0.30 07/02/2015 1146   BILITOT 0.7 11/20/2014 0433   GFRNONAA >60 12/14/2014 0800   GFRAA >60 12/14/2014 0800    No results found for: SPEP  Lab Results  Component Value Date   WBC 3.5* 07/02/2015   NEUTROABS 2.3 07/02/2015   HGB 12.9 07/02/2015   HCT 38.1 07/02/2015   MCV 98.6 07/02/2015   PLT 346 07/02/2015      Chemistry      Component Value Date/Time   NA 139 07/02/2015 1146   NA 140 12/14/2014 0800   K 4.4 07/02/2015 1146   K 3.6 12/14/2014 0800   CL 103 12/14/2014 0800   CL 105 05/06/2012 1333   CO2 29 07/02/2015 1146   CO2 28 12/14/2014 0800   BUN 11.4 07/02/2015 1146   BUN 6 12/14/2014 0800   CREATININE 0.8 07/02/2015 1146   CREATININE 0.64 12/14/2014 0800      Component Value Date/Time   CALCIUM 9.8 07/02/2015 1146   CALCIUM 9.6 12/14/2014 0800   ALKPHOS 76 07/02/2015 1146   ALKPHOS 105 11/20/2014 0433   AST 25 07/02/2015 1146   AST 28 11/20/2014 0433   ALT 32 07/02/2015 1146   ALT 62* 11/20/2014 0433   BILITOT 0.30 07/02/2015 1146   BILITOT 0.7 11/20/2014 0433       CA 27.29 0.0 - 38.6 U/mL 1,773.7 (H)         No components found for: LABCA125  No results for input(s): INR in the last 168 hours.  Urinalysis    Component Value Date/Time    COLORURINE YELLOW 11/17/2014 0143   APPEARANCEUR CLOUDY* 11/17/2014 0143   LABSPEC 1.010 11/17/2014 0143   LABSPEC 1.005 09/12/2013 1542   PHURINE 6.5 11/17/2014 0143   PHURINE 6.0 09/12/2013 1542   GLUCOSEU NEGATIVE 11/17/2014 0143   GLUCOSEU Negative 09/12/2013 1542   HGBUR NEGATIVE 11/17/2014 0143   HGBUR Negative 09/12/2013 1542   BILIRUBINUR NEGATIVE 11/17/2014 0143   BILIRUBINUR Negative 09/12/2013 1542   KETONESUR NEGATIVE 11/17/2014 0143   KETONESUR Negative 09/12/2013 1542   PROTEINUR NEGATIVE 11/17/2014 0143   PROTEINUR Negative 09/12/2013 1542   UROBILINOGEN 0.2 11/17/2014 0143   UROBILINOGEN 0.2 09/12/2013 1542   NITRITE NEGATIVE 11/17/2014 0143   NITRITE Negative 09/12/2013 1542   LEUKOCYTESUR NEGATIVE 11/17/2014 0143   LEUKOCYTESUR Negative 09/12/2013 1542    STUDIES: No results found.  ASSESSMENT: 55 y.o. [place] woman with stage IV breast cancer, history as follows  (1)  S/p Right lumpectomy and sentinel lymph node sampling 03/15/2004 for a pT1c pN0. Stage IA invasive ductal carcinoma, grade 2, estrogen receptor 95% positive, progesterone receptor 65% positive, HER-2 not amplified; additional surgery 04/25/2004 for seroma or clearance showed no residual tumor  (2) adjuvant chemotherapy with cyclophosphamide and doxorubicin every 21 days x4 completed 07/19/2004  (3) adjuvant radiation given under Dr. Donella Stade in [place] completed July 2006  (4) the patient opted against adjuvant antiestrogen therapy  (5) genetics testing showed no BRCA mutations  (6) biopsy of a palpable right axillary mass 10/24/2009 showed invasive ductal carcinoma, grade 3, estrogen receptor 100% positive,  progesterone receptor 2% positive (alert score 5) HER-2 negative; no evidence of systemic disease on PET scanning  (7) completed 3 of 4 planned cycles of docetaxel and cyclophosphamide September 2011, fourth cycle omitted because of marked elevations in liver function tests  (8) an  right axillary lymph node dissection 03/06/2010 showed 3/8 lymph nodes removed to be involved by tumor, with extracapsular extension.  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, TTS-1 negative), estrogen receptor 80% positive, progesterone receptor 2% positive, HER-2 not amplified  (12) letrozole started March 2015-- tolerated with significant side effects, discontinued at the end of May 2015  (13) PET scan 08/16/2013 shows extensive left pleural metastatic disease and a large left pleural effusion that shifts cardiac and mediastinal structures to the right; adenopathy (celiac trunk, periadrenal, periaortic); and a left medial clavicular lesion; Status post left thoracentesis 08/16/2013 positive for adenocarcinoma, estrogen receptor positive, progesterone receptor negative.  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; everolimus was suggested as an available targeted agent  (18) exemestane started 03/31/2014, discontinued 10/31/2014 with evidence of progression  (a) everolimus added 04/03/2014 but not tolerated (cytopenias, elevated LFTs)  even at minimal doses; stopped 04/17/2014  (19) fulvestrant started 12/20/2014  (a) palbociclib added at very low dose 04/03/2015 (starting dose 75 mg weekly)    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 treated with Lovenox for 5-6 months  (c) right upper extremity lymphedema--receiving physical therapy  (d) hepatic steatosis with chronically elevated LFTs as well as unusual hepatic sensitivity to chemotherapy  (e) osteopenia with the lowest T score -1.6 on bone density scan 06/20/2013  (f) radiation oncology (Dr Valere Dross) has reviewed prior radiation records in case there is further mediastinal involvement with dysphagia etc in which case palliative XRT could be considered  (a) radiation to left mediastinum/ left 7th rib 3250 cGy in 13 sessions04/18/2016 through 08/02/2014  (b) radiation to T11 area: 22 Gy in 7 sessions, last dose 11/27/2014  (c) radiation left parietal scalp region to be completed 12/22/2014  (d) radiation to sacral area completed 04/09/2015  (g) chest wall and perineal pain--improved post radiation treatments  (h) zoster diagnosed 04/04/2015-- on valacyclovir   PLAN: I spent approximately 45-50 minutes with [name] and [name] today going over her situation. Basically the way we are reading her scans and x-rays is stable disease. We understand the liver MRI, which may be showing some apparent new lesions, really is serving as a baseline.  She is tolerating the eye brands remarkably well in my view and we don't have any neutrophil count under 2.0. Her liver function tests remain normal. I think there is room for increasing the dose intensity there. She is very  anxious about this and would not want to end up neutropenic and then having to wait extra periods of time to resume and I can understand that. I feel very confident though that if we increase the  eye brands by just 2 additional tablets this coming cycle it will not significantly drop her neutrophil count and it may give [place] a little bit of an extra age.  Accordingly she will take eye brands beginning Sunday, April 16 then Tuesday Thursday and Saturday and then again Sunday, April 23 and the same pattern for the next 2 weeks. Of course we are following her counts on a weekly basis.  We are going to restage her with an MRI of the liver on May 8. We are going to obtain some plain films of the pelvis on April 17 test to see if there is any improvement in the fracture. Hopefully the denosumab can be helpful with that. We are also going to be following her CA-27-29 on every 28 day basis.  The biggest problem she has is that she cannot walk as much as she would like because of pain. She does use a cane which helps. I encouraged her to go ahead and take an Aleve and walk a little further but she is terrified of having more esophagitis problems so is unwilling to do that at present.  We also discussed the possibility of pembrolizumab. She understands this is not currently available for her off-the-shelf and she does not qualify for our study. Things may change though. If we get a good long period of disease stable ability with the current treatment perhaps the pembrolizumab which is what she would like to receive will be available for her at some point.  Overall I feel Manuela Schwartz is more positive and she is willing to increase the eye brands at least by the amount just discussed. She will proceed with the exterior and fulvestrant today. She knows to call for any problems that may develop before her next visit here.    Chauncey Cruel, MD   07/04/2015 8:24 AM

## 2015-07-04 NOTE — Therapy (Signed)
[place], [place], [address] Phone: [phone]   Fax:  [phone]  Physical Therapy Treatment  Patient Details  Name: [name] [name] MRN: [id number] Date of Birth: [birth year] Referring Provider: Dr. Lurline Del  Encounter Date: 07/04/2015      PT End of Session - 07/04/15 1127    Visit Number 61  total   Number of Visits 68  cancer rehab   Date for PT Re-Evaluation 09/17/15   Authorization - Visit Number 140   Authorization - Number of Visits 89   PT Start Time 1022   PT Stop Time 1105   PT Time Calculation (min) 43 min   Activity Tolerance Patient tolerated treatment well   Behavior During Therapy [facility] for tasks assessed/performed      Past Medical History  Diagnosis Date  . Seizures ([place]) 2010    Isolated incident.  Marland Kitchen PONV (postoperative nausea and vomiting)   . Peripheral vascular disease ([place]) 02/2010    blood clot related to porta cath  . S/P radiation therapy 07/17/2014 through 08/02/2014     Left mediastinum, left seventh rib 3250 cGy in 13 sessions   . S/P radiation therapy 12/11/2014 through 12/22/2014     Left parietal calvarium 2400 cGy in 8 sessions   . Breast cancer ([place]) dx'd 2005/2011  . Bone metastases ([place]) dx'd 05/2014    Past Surgical History  Procedure Laterality Date  . Breast lumpectomy  2005  . Axillary lymph node dissection  Dec. 2011  . Portacath placement  12/11  . Removal portacath    . Mediastinotomy chamberlain mcneil Left 06/02/2013    Procedure: MEDIASTINOTOMY CHAMBERLAIN MCNEIL;  Surgeon: [name], MD;  Location: [facility];  Service: Thoracic;  Laterality: Left;  LEFT ANTERIOR MEDIASTINOTOMY     There were no vitals filed for this visit.  Visit Diagnosis:  Lymphedema, not elsewhere  classified  Pain in right thigh      Subjective Assessment - 07/04/15 1023    Subjective Still full at right axilla and pain in groin.   Currently in Pain? Yes   Pain Score 5    Pain Location Groin   Pain Orientation Right   Pain Descriptors / Indicators Constant   Pain Type Chronic pain   Aggravating Factors  walking   Pain Relieving Factors soft tissue work   Pain Score 7   Pain Location Axilla   Pain Orientation Right   Pain Type Chronic pain   Aggravating Factors  more swelling   Pain Relieving Factors therapy                         [facility] Adult PT Treatment/Exercise - 07/04/15 0001    Manual Therapy   Myofascial Release right axilla with focus on scar tightness   Manual Lymphatic Drainage (MLD) In left sidelying, posterior interaxillary anastomosis and right axillo-inguinal anastomosis; in supine, short neck, left axilla and anterior interaxillary anastomosis, right groin and axillo-inguinal anastomosis, area between right breast scars, directing toward pathways, and right upper arm.  In right sidelying, left periscapular area toward left groin.   Other Manual Therapy soft tissue work at superior and entire aspect of right medial thigh in right sidelying for pain relief and muscle relaxation                PT Education - 07/03/15 1308    Education provided No  PT Short Term Goals - 02/27/15 1011    PT SHORT TERM GOAL #1   Title pain with walking decreased >/= 25%   Time 3   Period Weeks   Status Achieved   PT SHORT TERM GOAL #2   Title pelvis stays in correct alignment for 3 weeks   Time 3   Period Weeks   Status Achieved           PT Long Term Goals - 07/03/15 1310    PT LONG TERM GOAL #1   Title indpendent with HEP   Time 12   Period Weeks   PT LONG TERM GOAL #2   Time 12   Period Weeks   Status On-going   PT LONG TERM GOAL #3   Title go up and down steps with a step over step pattern   Time 6   Period Weeks    Status Deferred   PT LONG TERM GOAL #4   Title being able to last for 5 days without therapy and pain not going above a 8/10 due to decreased muscle soreness   Time 12   Period Weeks   Status New           Long Term Clinic Goals - 07/04/15 1130    CC Long Term Goal  #1   Title Reports left upper back and left side pain is controlled at 4/10 or less with therapy at the current frequency.   Status Achieved   CC Long Term Goal  #2   Title Pt. will report swelling is adequately managed to enable ADL function at a consistent level.   Status On-going   CC Long Term Goal  #4   Title Pain/discomfort at right axilla area will be controlled at 6/10 or less.   Time 12   Period Weeks   Status New            Plan - 07/04/15 1128    Clinical Impression Statement In lymphedema/cancer rehab therapy: continues to have fullness at right upper outer breast, though less indurated today than sometimes.  Groin pain less than sometimes too, as patient had treatment with pelvic therapist yesterday.   Pt will benefit from skilled therapeutic intervention in order to improve on the following deficits Pain;Abnormal gait;Increased muscle spasms;Increased fascial restricitons;Increased edema   Rehab Potential Good   Clinical Impairments Affecting Rehab Potential active cancer   PT Frequency 2x / week   PT Duration 12 weeks   PT Treatment/Interventions Manual techniques;Manual lymph drainage   PT Next Visit Plan soft tissue work, manual lymph drainage   Consulted and Agree with Plan of Care Patient        Problem List Patient Active Problem List   Diagnosis Date Noted  . 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 Zoster 04/04/2015  . Nausea with vomiting 11/18/2014  . Constipation 11/18/2014  . Left-sided thoracic back pain   . Bone metastases ([place]) 11/16/2014  . Back pain 11/15/2014  . Uncontrolled pain 11/14/2014  . 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 Chest wall  pain 03/21/2014  . Abnormal LFTs (liver function tests) 09/12/2013  . Breast cancer of upper-inner quadrant of right female breast ([place]) 08/18/2013  . 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

## 2015-07-05 ENCOUNTER — Ambulatory Visit: Payer: 59 | Admitting: Physical Therapy

## 2015-07-05 DIAGNOSIS — M62838 Other muscle spasm: Secondary | ICD-10-CM

## 2015-07-05 DIAGNOSIS — M25551 Pain in right hip: Secondary | ICD-10-CM | POA: Diagnosis not present

## 2015-07-05 NOTE — Therapy (Signed)
[facility] [address], [place], [place], [address] Phone: [phone]   Fax:  [phone]  Physical Therapy Treatment  Patient Details  Name: [name] [name] MRN: [id number] Date of Birth: 14-Jul-1960 Referring Provider: Dr. Lurline Del  Encounter Date: 07/05/2015      PT End of Session - 07/05/15 1303    Visit Number 67  pelvis   Date for PT Re-Evaluation 09/17/15   Authorization - Visit Number 140   Authorization - Number of Visits 40   PT Start Time [id number]   PT Stop Time 1303   PT Time Calculation (min) 33 min   Activity Tolerance Patient tolerated treatment well   Behavior During Therapy [facility] for tasks assessed/performed      Past Medical History  Diagnosis Date  . Seizures ([place]) 2010    Isolated incident.  Marland Kitchen PONV (postoperative nausea and vomiting)   . Peripheral vascular disease ([place]) 02/2010    blood clot related to porta cath  . S/P radiation therapy 07/17/2014 through 08/02/2014     Left mediastinum, left seventh rib 3250 cGy in 13 sessions   . S/P radiation therapy 12/11/2014 through 12/22/2014     Left parietal calvarium 2400 cGy in 8 sessions   . Breast cancer ([place]) dx'd 2005/2011  . Bone metastases ([place]) dx'd 05/2014    Past Surgical History  Procedure Laterality Date  . Breast lumpectomy  2005  . Axillary lymph node dissection  Dec. 2011  . Portacath placement  12/11  . Removal portacath    . Mediastinotomy chamberlain mcneil Left 06/02/2013    Procedure: MEDIASTINOTOMY CHAMBERLAIN MCNEIL;  Surgeon: [name], MD;  Location: [facility];  Service: Thoracic;  Laterality: Left;  LEFT ANTERIOR MEDIASTINOTOMY     There were no vitals filed for this visit.  Visit Diagnosis:  Pain in right hip      Subjective Assessment -  07/05/15 1235    Subjective I moved wrong and got a sharp pain. I will have an x-ray of pelvis on 07/16/2015.   How long can you walk comfortably? make patient limp   Diagnostic tests x rays of back show no fractures;    Patient Stated Goals walk 20 minutes with minimal limp   Currently in Pain? Yes   Pain Score 5    Pain Location Groin   Pain Orientation Right   Pain Descriptors / Indicators Constant   Pain Type Chronic pain   Pain Onset More than a month ago   Pain Frequency Constant   Aggravating Factors  walking   Pain Relieving Factors soft tissue work   Effect of Pain on Daily Activities walk with a limp   Multiple Pain Sites No                      Pelvic Floor Special Questions - 07/05/15 0001    Pelvic Floor Internal Exam Patient approves physical therapist to perform pelvic floor muscle assessment   Exam Type Vaginal           [facility] Adult PT Treatment/Exercise - 07/05/15 0001    Manual Therapy   Soft tissue mobilization right hamstring, rightn iliotibial band, right gluteal; right hip adductor and quad   Internal Pelvic Floor bil. levator ani, right obturator internist; posterior furchette, right side of urethra                PT Education - 07/05/15 1300    Education provided  No          PT Short Term Goals - 02/27/15 1011    PT SHORT TERM GOAL #1   Title pain with walking decreased >/= 25%   Time 3   Period Weeks   Status Achieved   PT SHORT TERM GOAL #2   Title pelvis stays in correct alignment for 3 weeks   Time 3   Period Weeks   Status Achieved           PT Long Term Goals - 07/03/15 1310    PT LONG TERM GOAL #1   Title indpendent with HEP   Time 12   Period Weeks   PT LONG TERM GOAL #2   Time 12   Period Weeks   Status On-going   PT LONG TERM GOAL #3   Title go up and down steps with a step over step pattern   Time 6   Period Weeks   Status Deferred   PT LONG TERM GOAL #4   Title being able to last for 5  days without therapy and pain not going above a 8/10 due to decreased muscle soreness   Time 12   Period Weeks   Status New           Long Term Clinic Goals - 07/04/15 1130    CC Long Term Goal  #1   Title Reports left upper back and left side pain is controlled at 4/10 or less with therapy at the current frequency.   Status Achieved   CC Long Term Goal  #2   Title Pt. will report swelling is adequately managed to enable ADL function at a consistent level.   Status On-going   CC Long Term Goal  #4   Title Pain/discomfort at right axilla area will be controlled at 6/10 or less.   Time 12   Period Weeks   Status New            Plan - 07/05/15 1304    Clinical Impression Statement Patient had less tenderness located in the pelvic floor muscles compared to other treatments.  Pateint able to walk with less pain and limp after therapy.  Patient will benefit from physical therapy to reduce pain.    Pt will benefit from skilled therapeutic intervention in order to improve on the following deficits Pain;Abnormal gait;Increased muscle spasms;Increased fascial restricitons;Increased edema   Rehab Potential Good   Clinical Impairments Affecting Rehab Potential active cancer   PT Frequency 2x / week   PT Duration 12 weeks   PT Treatment/Interventions Manual techniques;Manual lymph drainage   PT Next Visit Plan soft tissue work, manual lymph drainage   PT Home Exercise Plan progress as needed   Consulted and Agree with Plan of Care Patient   PT Plan Continue manual lymph drainage, soft tissue work.        Problem List Patient Active Problem List   Diagnosis Date Noted  . 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 Zoster 04/04/2015  . Nausea with vomiting 11/18/2014  . Constipation 11/18/2014  . Left-sided thoracic back pain   . Bone metastases ([place]) 11/16/2014  . Back pain 11/15/2014  . Uncontrolled pain 11/14/2014  . Post-lymphadenectomy lymphedema of arm 05/31/2014   . Chest wall pain 03/21/2014  . Abnormal LFTs (liver function tests) 09/12/2013  . Breast cancer of upper-inner quadrant of right female breast ([place]) 08/18/2013  . 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, [place], [place], [address] Phone: [phone]   Fax:  [phone]  Name: [name] [name] MRN: [id number] Date of Birth: 1960-04-21

## 2015-07-06 ENCOUNTER — Encounter: Payer: Self-pay | Admitting: *Deleted

## 2015-07-09 ENCOUNTER — Ambulatory Visit: Payer: 59 | Admitting: Physical Therapy

## 2015-07-09 ENCOUNTER — Other Ambulatory Visit (HOSPITAL_BASED_OUTPATIENT_CLINIC_OR_DEPARTMENT_OTHER): Payer: 59

## 2015-07-09 DIAGNOSIS — I890 Lymphedema, not elsewhere classified: Secondary | ICD-10-CM

## 2015-07-09 DIAGNOSIS — C771 Secondary and unspecified malignant neoplasm of intrathoracic lymph nodes: Secondary | ICD-10-CM | POA: Diagnosis not present

## 2015-07-09 DIAGNOSIS — C50211 Malignant neoplasm of upper-inner quadrant of right female breast: Secondary | ICD-10-CM | POA: Diagnosis not present

## 2015-07-09 DIAGNOSIS — C7951 Secondary malignant neoplasm of bone: Secondary | ICD-10-CM | POA: Diagnosis not present

## 2015-07-09 DIAGNOSIS — J90 Pleural effusion, not elsewhere classified: Secondary | ICD-10-CM

## 2015-07-09 DIAGNOSIS — M79651 Pain in right thigh: Secondary | ICD-10-CM

## 2015-07-09 LAB — CBC WITH DIFFERENTIAL/PLATELET
BASO%: 1.1 % (ref 0.0–2.0)
BASOS ABS: 0 10*3/uL (ref 0.0–0.1)
EOS ABS: 0.1 10*3/uL (ref 0.0–0.5)
EOS%: 2.2 % (ref 0.0–7.0)
HEMATOCRIT: 37.5 % (ref 34.8–46.6)
HEMOGLOBIN: 12.8 g/dL (ref 11.6–15.9)
LYMPH#: 0.7 10*3/uL — AB (ref 0.9–3.3)
LYMPH%: 16.2 % (ref 14.0–49.7)
MCH: 33.7 pg (ref 25.1–34.0)
MCHC: 34.1 g/dL (ref 31.5–36.0)
MCV: 98.6 fL (ref 79.5–101.0)
MONO#: 0.4 10*3/uL (ref 0.1–0.9)
MONO%: 9.6 % (ref 0.0–14.0)
NEUT#: 3 10*3/uL (ref 1.5–6.5)
NEUT%: 70.9 % (ref 38.4–76.8)
Platelets: 324 10*3/uL (ref 145–400)
RBC: 3.8 10*6/uL (ref 3.70–5.45)
RDW: 15.8 % — AB (ref 11.2–14.5)
WBC: 4.2 10*3/uL (ref 3.9–10.3)

## 2015-07-09 LAB — COMPREHENSIVE METABOLIC PANEL
ALBUMIN: 3.9 g/dL (ref 3.5–5.0)
ALK PHOS: 72 U/L (ref 40–150)
ALT: 32 U/L (ref 0–55)
ANION GAP: 7 meq/L (ref 3–11)
AST: 25 U/L (ref 5–34)
BILIRUBIN TOTAL: 0.31 mg/dL (ref 0.20–1.20)
BUN: 12.3 mg/dL (ref 7.0–26.0)
CO2: 27 meq/L (ref 22–29)
CREATININE: 0.9 mg/dL (ref 0.6–1.1)
Calcium: 9.2 mg/dL (ref 8.4–10.4)
Chloride: 104 mEq/L (ref 98–109)
EGFR: 77 mL/min/{1.73_m2} — AB (ref >90)
GLUCOSE: 72 mg/dL (ref 70–140)
Potassium: 4 mEq/L (ref 3.5–5.1)
SODIUM: 138 meq/L (ref 136–145)
TOTAL PROTEIN: 7.4 g/dL (ref 6.4–8.3)

## 2015-07-09 NOTE — Therapy (Signed)
[place], [place], [address] Phone: [phone]   Fax:  [phone]  Physical Therapy Treatment  Patient Details  Name: [name] [name] MRN: [id number] Date of Birth: [birth year] Referring Provider: Dr. Lurline Del  Encounter Date: 07/09/2015      PT End of Session - 07/09/15 1248    Visit Number 59  total   Number of Visits 42  cancer rehab   Date for PT Re-Evaluation 09/17/15   Authorization Type April cert for lymphedema therapy done   Authorization - Visit Number 140   PT Start Time 1025   PT Stop Time 1105   PT Time Calculation (min) 40 min   Activity Tolerance Patient tolerated treatment well   Behavior During Therapy [facility] for tasks assessed/performed      Past Medical History  Diagnosis Date  . Seizures ([place]) 2010    Isolated incident.  Marland Kitchen PONV (postoperative nausea and vomiting)   . Peripheral vascular disease ([place]) 02/2010    blood clot related to porta cath  . S/P radiation therapy 07/17/2014 through 08/02/2014     Left mediastinum, left seventh rib 3250 cGy in 13 sessions   . S/P radiation therapy 12/11/2014 through 12/22/2014     Left parietal calvarium 2400 cGy in 8 sessions   . Breast cancer ([place]) dx'd 2005/2011  . Bone metastases ([place]) dx'd 05/2014    Past Surgical History  Procedure Laterality Date  . Breast lumpectomy  2005  . Axillary lymph node dissection  Dec. 2011  . Portacath placement  12/11  . Removal portacath    . Mediastinotomy chamberlain mcneil Left 06/02/2013    Procedure: MEDIASTINOTOMY CHAMBERLAIN MCNEIL;  Surgeon: [name], MD;  Location: [facility];  Service: Thoracic;  Laterality: Left;  LEFT ANTERIOR MEDIASTINOTOMY     There were no vitals filed for this visit.      Subjective  Assessment - 07/09/15 1027    Subjective "I'm having a day."   Currently in Pain? Yes   Pain Score 6    Pain Location Groin   Pain Orientation Right   Pain Descriptors / Indicators Constant   Aggravating Factors  walking   Pain Relieving Factors soft tissue work   Pain Score 7   Pain Location Axilla   Pain Orientation Right   Aggravating Factors  more swelling   Pain Relieving Factors therapy                         [facility] Adult PT Treatment/Exercise - 07/09/15 0001    Manual Therapy   Myofascial Release right axilla with focus on scar tightness   Manual Lymphatic Drainage (MLD) In left sidelying, posterior interaxillary anastomosis and right axillo-inguinal anastomosis; in supine, short neck, left axilla and anterior interaxillary anastomosis, right groin and axillo-inguinal anastomosis, area between right breast scars, directing toward pathways, and right upper arm.  In right sidelying, left periscapular area toward left groin.   Other Manual Therapy soft tissue work at superior and entire aspect of right medial thigh in right sidelying for pain relief and muscle relaxation                  PT Short Term Goals - 02/27/15 1011    PT SHORT TERM GOAL #1   Title pain with walking decreased >/= 25%   Time 3   Period Weeks   Status Achieved   PT SHORT TERM GOAL #2  Title pelvis stays in correct alignment for 3 weeks   Time 3   Period Weeks   Status Achieved           PT Long Term Goals - 07/03/15 1310    PT LONG TERM GOAL #1   Title indpendent with HEP   Time 12   Period Weeks   PT LONG TERM GOAL #2   Time 12   Period Weeks   Status On-going   PT LONG TERM GOAL #3   Title go up and down steps with a step over step pattern   Time 6   Period Weeks   Status Deferred   PT LONG TERM GOAL #4   Title being able to last for 5 days without therapy and pain not going above a 8/10 due to decreased muscle soreness   Time 12   Period Weeks   Status  New           Long Term Clinic Goals - 07/09/15 1250    CC Long Term Goal  #1   Title Reports left upper back and left side pain is controlled at 4/10 or less with therapy at the current frequency.   Status Achieved   CC Long Term Goal  #2   Title Pt. will report swelling is adequately managed to enable ADL function at a consistent level.   Baseline This is more difficult to control, but does benefit from manual lymph drainage.   Status On-going   CC Long Term Goal  #4   Title Pain/discomfort at right axilla area will be controlled at 6/10 or less.   Status On-going            Plan - 07/09/15 1249    Clinical Impression Statement Continuing to have fullness at right upper outer breast and tightness at right medial thigh, which are both painful.   Rehab Potential Good   Clinical Impairments Affecting Rehab Potential active cancer   PT Frequency 2x / week   PT Duration 12 weeks   PT Treatment/Interventions Manual techniques;Manual lymph drainage;ADLs/Self Care Home Management;Patient/family education   PT Next Visit Plan soft tissue work, manual lymph drainage   Consulted and Agree with Plan of Care Patient      Patient will benefit from skilled therapeutic intervention in order to improve the following deficits and impairments:  Pain, Abnormal gait, Increased muscle spasms, Increased fascial restricitons, Increased edema  Visit Diagnosis: Lymphedema, not elsewhere classified - Plan: PT plan of care cert/re-cert  Pain in right thigh - Plan: PT plan of care cert/re-cert     Problem List Patient Active Problem List   Diagnosis Date Noted  . 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 Zoster 04/04/2015  . Nausea with vomiting 11/18/2014  . Constipation 11/18/2014  . Left-sided thoracic back pain   . Bone metastases ([place]) 11/16/2014  . Back pain 11/15/2014  . Uncontrolled pain 11/14/2014  . 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 Chest wall pain  03/21/2014  . Abnormal LFTs (liver function tests) 09/12/2013  . Breast cancer of upper-inner quadrant of right female breast ([place]) 08/18/2013  . 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

## 2015-07-10 ENCOUNTER — Ambulatory Visit: Payer: 59 | Admitting: Physical Therapy

## 2015-07-10 ENCOUNTER — Encounter: Payer: Self-pay | Admitting: Physical Therapy

## 2015-07-10 DIAGNOSIS — M25551 Pain in right hip: Secondary | ICD-10-CM

## 2015-07-10 DIAGNOSIS — M62838 Other muscle spasm: Secondary | ICD-10-CM

## 2015-07-10 LAB — CANCER ANTIGEN 27.29: CA 27.29: 1635.9 U/mL — ABNORMAL HIGH (ref 0.0–38.6)

## 2015-07-10 NOTE — Therapy (Signed)
[facility] [address] [place], [place], [address] Phone: [phone]   Fax:  [phone]  Physical Therapy Treatment  Patient Details  Name: [name] [name] MRN: [id number] Date of Birth: 1960-12-18 Referring Provider: Dr. Lurline Del  Encounter Date: 07/10/2015      PT End of Session - 07/10/15 1314    Visit Number 21   Date for PT Re-Evaluation 09/17/15   Authorization Type April cert for lymphedema therapy done   Authorization - Visit Number 140   Authorization - Number of Visits 55   PT Start Time 1230   PT Stop Time 1310   PT Time Calculation (min) 40 min   Activity Tolerance Patient tolerated treatment well   Behavior During Therapy [facility] for tasks assessed/performed      Past Medical History  Diagnosis Date  . Seizures ([place]) 2010    Isolated incident.  Marland Kitchen PONV (postoperative nausea and vomiting)   . Peripheral vascular disease ([place]) 02/2010    blood clot related to porta cath  . S/P radiation therapy 07/17/2014 through 08/02/2014     Left mediastinum, left seventh rib 3250 cGy in 13 sessions   . S/P radiation therapy 12/11/2014 through 12/22/2014     Left parietal calvarium 2400 cGy in 8 sessions   . Breast cancer ([place]) dx'd 2005/2011  . Bone metastases ([place]) dx'd 05/2014    Past Surgical History  Procedure Laterality Date  . Breast lumpectomy  2005  . Axillary lymph node dissection  Dec. 2011  . Portacath placement  12/11  . Removal portacath    . Mediastinotomy chamberlain mcneil Left 06/02/2013    Procedure: MEDIASTINOTOMY CHAMBERLAIN MCNEIL;  Surgeon: [name], MD;  Location: [facility];  Service: Thoracic;  Laterality: Left;  LEFT ANTERIOR MEDIASTINOTOMY     There were no vitals filed for this visit.      Subjective  Assessment - 07/10/15 1234    Subjective Pain was running around a 5/10 and I bent over to get my cane increasing his pain.     How long can you walk comfortably? make patient limp   Patient Stated Goals walk 20 minutes with minimal limp   Currently in Pain? Yes   Pain Score 6    Pain Location Groin   Pain Orientation Right   Pain Descriptors / Indicators Constant   Pain Type Chronic pain   Pain Onset More than a month ago   Pain Frequency Constant   Aggravating Factors  walking   Pain Relieving Factors soft tissue work   Effect of Pain on Daily Activities walk with a limp   Multiple Pain Sites No            [facility] PT Assessment - 07/10/15 0001    Assessment   Medical Diagnosis perirectal rectal/perineal pain   Onset Date/Surgical Date 01/28/15   Precautions   Precautions Other (comment)   Precaution Comments No ultrasound   Balance Screen   Has the patient fallen in the past 6 months No   Has the patient had a decrease in activity level because of a fear of falling?  No   Is the patient reluctant to leave their home because of a fear of falling?  No   Prior Function   Level of Independence Independent   Cognition   Overall Cognitive Status Within Functional Limits for tasks assessed   Strength   Right Hip Flexion 4/5  pain   Right Hip External  Rotation  4/5  pain   Right Hip ABduction 4/5  pain   Palpation   SI assessment  pelvis in correct alignment   Ambulation/Gait   Ambulation/Gait Yes   Ambulation/Gait Assistance 6: Modified independent (Device/Increase time)   Assistive device Straight cane   Gait Pattern Decreased weight shift to right;Decreased stance time - right   Ambulation Surface Level                  Pelvic Floor Special Questions - 07/10/15 0001    Pelvic Floor Internal Exam Patient approves physical therapist to perform pelvic floor muscle assessment   Exam Type Vaginal   Strength good squeeze, good lift, able to hold agaisnt strong  resistance           [facility] Adult PT Treatment/Exercise - 07/10/15 0001    Manual Therapy   Manual Therapy Soft tissue mobilization;Internal Pelvic Floor   Soft tissue mobilization right hamstring, rightn iliotibial band, right gluteal; right hip adductor and quad   Internal Pelvic Floor bil. levator ani, right obturator internist; posterior furchette, right side of urethra                PT Education - 07/10/15 1314    Education provided No          PT Short Term Goals - 02/27/15 1011    PT SHORT TERM GOAL #1   Title pain with walking decreased >/= 25%   Time 3   Period Weeks   Status Achieved   PT SHORT TERM GOAL #2   Title pelvis stays in correct alignment for 3 weeks   Time 3   Period Weeks   Status Achieved           PT Long Term Goals - 07/10/15 1315    PT LONG TERM GOAL #1   Title indpendent with HEP   Time 12   Period Weeks   Status On-going  still learning    PT LONG TERM GOAL #2   Title pain with walking decreased >/= 80%   Time 12   Period Weeks   Status On-going   PT LONG TERM GOAL #3   Title go up and down steps with a step over step pattern   Time 6   Period Weeks   Status Deferred   PT LONG TERM GOAL #4   Title being able to last for 5 days without therapy and pain not going above a 8/10 due to decreased muscle soreness   Time 12   Period Weeks   Status New           Long Term Clinic Goals - 07/09/15 1250    CC Long Term Goal  #1   Title Reports left upper back and left side pain is controlled at 4/10 or less with therapy at the current frequency.   Status Achieved   CC Long Term Goal  #2   Title Pt. will report swelling is adequately managed to enable ADL function at a consistent level.   Baseline This is more difficult to control, but does benefit from manual lymph drainage.   Status On-going   CC Long Term Goal  #4   Title Pain/discomfort at right axilla area will be controlled at 6/10 or less.   Status On-going             Plan - 07/10/15 1319    Clinical Impression Statement Patient pain in right groin at level 6/10.  Pelvic  floor strength is 4/5.  Right hip flexion, abduction and ER is 4/5.  Patient has a fracture that is located on right ishium and her cancer has spread in the pelvis and other parts of the body.  Patient has palpable muscle tenderness located in bi. levaor ani,, bil. obturator internist, right hamstring and hip adductors.  Patient ambulates with a increased limp on the left when her pain incresaes and uses a SPC.  Patient has avoided steps due to  pain.  Patient can last 3 days without therapy before her pain increases .  Patient will benefit from physical therapy to reduce pain and improve walking.    Rehab Potential Good   Clinical Impairments Affecting Rehab Potential active cancer   PT Frequency 2x / week   PT Duration 12 weeks   PT Treatment/Interventions Manual techniques;Manual lymph drainage;ADLs/Self Care Home Management;Patient/family education   PT Next Visit Plan soft tissue work, manual lymph drainage   PT Home Exercise Plan progress as needed   Recommended Other Services None   Consulted and Agree with Plan of Care Patient   PT Plan Continue manual lymph drainage, soft tissue work.      Patient will benefit from skilled therapeutic intervention in order to improve the following deficits and impairments:  Pain, Increased muscle spasms, Difficulty walking  Visit Diagnosis: Pain in right hip - Plan: PT plan of care cert/re-cert  Other muscle spasm - Plan: PT plan of care cert/re-cert     Problem List Patient Active Problem List   Diagnosis Date Noted  . 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 Zoster 04/04/2015  . Nausea with vomiting 11/18/2014  . Constipation 11/18/2014  . Left-sided thoracic back pain   . Bone metastases ([place]) 11/16/2014  . Back pain 11/15/2014  . Uncontrolled pain 11/14/2014  . Post-lymphadenectomy lymphedema of arm 05/31/2014   . Chest wall pain 03/21/2014  . Abnormal LFTs (liver function tests) 09/12/2013  . Breast cancer of upper-inner quadrant of right female breast ([place]) 08/18/2013  . 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, [place], [place], [address] Phone: [phone]   Fax:  [phone]  Name: [name] [name] MRN: [id number] Date of Birth: 30-Jun-1960

## 2015-07-11 ENCOUNTER — Ambulatory Visit: Payer: 59 | Admitting: Physical Therapy

## 2015-07-11 DIAGNOSIS — I890 Lymphedema, not elsewhere classified: Secondary | ICD-10-CM

## 2015-07-11 DIAGNOSIS — M79651 Pain in right thigh: Secondary | ICD-10-CM

## 2015-07-11 NOTE — Therapy (Signed)
[place], [place], [address] Phone: [phone]   Fax:  [phone]  Physical Therapy Treatment  Patient Details  Name: [name] [name] MRN: [id number] Date of Birth: 1960-09-16 Referring Provider: Dr. Lurline Del  Encounter Date: 07/11/2015      PT End of Session - 07/11/15 1217    Visit Number 59   Number of Visits 75  cancer rehab   Date for PT Re-Evaluation 09/17/15  [facility]   Authorization Type April cert for lymphedema therapy done   Authorization - Visit Number 140   PT Start Time 1023   PT Stop Time 1110   PT Time Calculation (min) 47 min   Activity Tolerance Patient tolerated treatment well   Behavior During Therapy [facility] for tasks assessed/performed      Past Medical History  Diagnosis Date  . Seizures ([place]) 2010    Isolated incident.  Marland Kitchen PONV (postoperative nausea and vomiting)   . Peripheral vascular disease ([place]) 02/2010    blood clot related to porta cath  . S/P radiation therapy 07/17/2014 through 08/02/2014     Left mediastinum, left seventh rib 3250 cGy in 13 sessions   . S/P radiation therapy 12/11/2014 through 12/22/2014     Left parietal calvarium 2400 cGy in 8 sessions   . Breast cancer ([place]) dx'd 2005/2011  . Bone metastases ([place]) dx'd 05/2014    Past Surgical History  Procedure Laterality Date  . Breast lumpectomy  2005  . Axillary lymph node dissection  Dec. 2011  . Portacath placement  12/11  . Removal portacath    . Mediastinotomy chamberlain mcneil Left 06/02/2013    Procedure: MEDIASTINOTOMY CHAMBERLAIN MCNEIL;  Surgeon: [name], MD;  Location: [facility];  Service: Thoracic;  Laterality: Left;  LEFT ANTERIOR MEDIASTINOTOMY     There were no vitals filed for this visit.      Subjective  Assessment - 07/11/15 1024    Subjective "My arm feel heavy, but when you finish, it's better."  "I haven't had the nerve seize."   Currently in Pain? Yes   Pain Score 5    Pain Location Groin   Pain Orientation Right   Aggravating Factors  walking   Pain Relieving Factors soft tissue work   Pain Score 6   Pain Location Axilla   Pain Orientation Right   Pain Descriptors / Indicators Tender  really hard, like concrete, tender to touch   Aggravating Factors  more swelling   Pain Relieving Factors therapy                         [facility] Adult PT Treatment/Exercise - 07/11/15 0001    Manual Therapy   Myofascial Release right axilla with focus on scar tightness   Manual Lymphatic Drainage (MLD) In left sidelying, posterior interaxillary anastomosis and right axillo-inguinal anastomosis; in supine, short neck, left axilla and anterior interaxillary anastomosis, right groin and axillo-inguinal anastomosis, area between right breast scars, directing toward pathways, and right upper arm.  In right sidelying, left periscapular area toward left groin.   Other Manual Therapy soft tissue work at superior and entire aspect of right medial thigh in right sidelying for pain relief and muscle relaxation                PT Education - 07/10/15 1314    Education provided No          PT Short Term Goals -  02/27/15 1011    PT SHORT TERM GOAL #1   Title pain with walking decreased >/= 25%   Time 3   Period Weeks   Status Achieved   PT SHORT TERM GOAL #2   Title pelvis stays in correct alignment for 3 weeks   Time 3   Period Weeks   Status Achieved           PT Long Term Goals - 07/10/15 1315    PT LONG TERM GOAL #1   Title indpendent with HEP   Time 12   Period Weeks   Status On-going  still learning    PT LONG TERM GOAL #2   Title pain with walking decreased >/= 80%   Time 12   Period Weeks   Status On-going   PT LONG TERM GOAL #3   Title go up and down  steps with a step over step pattern   Time 6   Period Weeks   Status Deferred   PT LONG TERM GOAL #4   Title being able to last for 5 days without therapy and pain not going above a 8/10 due to decreased muscle soreness   Time 12   Period Weeks   Status New           Long Term Clinic Goals - 07/11/15 1220    CC Long Term Goal  #2   Title Pt. will report swelling is adequately managed to enable ADL function at a consistent level.   Status On-going   CC Long Term Goal  #4   Title Pain/discomfort at right axilla area will be controlled at 6/10 or less.   Status On-going            Plan - 07/11/15 1218    Clinical Impression Statement Therapy ongoing for relief of discomfort both from swelling at right upper outer breast and right groin area.   Rehab Potential Good   Clinical Impairments Affecting Rehab Potential active cancer   PT Frequency 2x / week   PT Duration 12 weeks   PT Treatment/Interventions Manual techniques;Manual lymph drainage   PT Next Visit Plan soft tissue work, manual lymph drainage   Consulted and Agree with Plan of Care Patient      Patient will benefit from skilled therapeutic intervention in order to improve the following deficits and impairments:  Pain, Increased muscle spasms, Difficulty walking, Increased edema  Visit Diagnosis: Lymphedema, not elsewhere classified  Pain in right thigh     Problem List Patient Active Problem List   Diagnosis Date Noted  . 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 Zoster 04/04/2015  . Nausea with vomiting 11/18/2014  . Constipation 11/18/2014  . Left-sided thoracic back pain   . Bone metastases ([place]) 11/16/2014  . Back pain 11/15/2014  . Uncontrolled pain 11/14/2014  . 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 Chest wall pain 03/21/2014  . Abnormal LFTs (liver function tests) 09/12/2013  . Breast cancer of upper-inner quadrant of right female breast ([place]) 08/18/2013  . Secondary  malignant neoplasm of mediastinal lymph node ([place]) 08/18/2013    [name],[name] 07/11/2015, 12:21 PM  [facility] [place], [place], [address] Phone: [phone]   Fax:  [phone]  Name: [name] [name] MRN: [id number] Date of Birth: 12-21-1960    [name], PT 07/11/2015 12:21 PM

## 2015-07-12 ENCOUNTER — Ambulatory Visit: Payer: 59 | Admitting: Physical Therapy

## 2015-07-12 ENCOUNTER — Encounter: Payer: Self-pay | Admitting: Physical Therapy

## 2015-07-12 DIAGNOSIS — M62838 Other muscle spasm: Secondary | ICD-10-CM

## 2015-07-12 DIAGNOSIS — M25551 Pain in right hip: Secondary | ICD-10-CM | POA: Diagnosis not present

## 2015-07-12 NOTE — Therapy (Signed)
[facility] [address], [place], [place], [address] Phone: [phone]   Fax:  [phone]  Physical Therapy Treatment  Patient Details  Name: [name] [name] MRN: [id number] Date of Birth: [birth year] Referring Provider: Dr. Lurline Del  Encounter Date: 07/12/2015      PT End of Session - 07/12/15 1304    Visit Number 57  pelvis   Date for PT Re-Evaluation 09/17/15   Authorization Type April cert for lymphedema therapy done   Authorization - Visit Number 140   Authorization - Number of Visits 57   PT Start Time 1230   PT Stop Time 1300   PT Time Calculation (min) 30 min   Activity Tolerance Patient tolerated treatment well   Behavior During Therapy [facility] for tasks assessed/performed      Past Medical History  Diagnosis Date  . Seizures ([place]) 2010    Isolated incident.  Marland Kitchen PONV (postoperative nausea and vomiting)   . Peripheral vascular disease ([place]) 02/2010    blood clot related to porta cath  . S/P radiation therapy 07/17/2014 through 08/02/2014     Left mediastinum, left seventh rib 3250 cGy in 13 sessions   . S/P radiation therapy 12/11/2014 through 12/22/2014     Left parietal calvarium 2400 cGy in 8 sessions   . Breast cancer ([place]) dx'd 2005/2011  . Bone metastases ([place]) dx'd 05/2014    Past Surgical History  Procedure Laterality Date  . Breast lumpectomy  2005  . Axillary lymph node dissection  Dec. 2011  . Portacath placement  12/11  . Removal portacath    . Mediastinotomy chamberlain mcneil Left 06/02/2013    Procedure: MEDIASTINOTOMY CHAMBERLAIN MCNEIL;  Surgeon: [name], MD;  Location: [facility];  Service: Thoracic;  Laterality: Left;  LEFT ANTERIOR MEDIASTINOTOMY     There were no vitals filed for this visit.       Subjective Assessment - 07/12/15 1231    Subjective I do not feel like it is not too swollen in there.  I have to be careful on the way I move. I have the x-ray next week.    How long can you walk comfortably? make patient limp   Patient Stated Goals walk 20 minutes with minimal limp   Currently in Pain? Yes   Pain Score 6    Pain Location Groin   Pain Orientation Right   Pain Descriptors / Indicators Constant   Pain Type Chronic pain   Pain Onset More than a month ago   Pain Frequency Constant   Aggravating Factors  walking and moving   Pain Relieving Factors soft tissue work   Effect of Pain on Daily Activities walk with a limp   Multiple Pain Sites No                         [facility] Adult PT Treatment/Exercise - 07/12/15 0001    Manual Therapy   Manual Therapy Soft tissue mobilization;Internal Pelvic Floor   Soft tissue mobilization right hamstring, rightn iliotibial band, right gluteal; right hip adductor and quad   Internal Pelvic Floor bil. levator ani, right obturator internist; posterior furchette, right side of urethra                  PT Short Term Goals - 02/27/15 1011    PT SHORT TERM GOAL #1   Title pain with walking decreased >/= 25%   Time 3  Period Weeks   Status Achieved   PT SHORT TERM GOAL #2   Title pelvis stays in correct alignment for 3 weeks   Time 3   Period Weeks   Status Achieved           PT Long Term Goals - 07/10/15 1315    PT LONG TERM GOAL #1   Title indpendent with HEP   Time 12   Period Weeks   Status On-going  still learning    PT LONG TERM GOAL #2   Title pain with walking decreased >/= 80%   Time 12   Period Weeks   Status On-going   PT LONG TERM GOAL #3   Title go up and down steps with a step over step pattern   Time 6   Period Weeks   Status Deferred   PT LONG TERM GOAL #4   Title being able to last for 5 days without therapy and pain not going above a 8/10 due to decreased muscle soreness    Time 12   Period Weeks   Status New           Long Term Clinic Goals - 07/11/15 1220    CC Long Term Goal  #2   Title Pt. will report swelling is adequately managed to enable ADL function at a consistent level.   Status On-going   CC Long Term Goal  #4   Title Pain/discomfort at right axilla area will be controlled at 6/10 or less.   Status On-going            Plan - 07/12/15 1305    Clinical Impression Statement Patient has less tenderness in the pelvic floor muscles.  Patient had decreased pain in right hip with walking after therapy. Patient will benefit from physical therapy to reduce pain.    Rehab Potential Good   Clinical Impairments Affecting Rehab Potential active cancer   PT Frequency 2x / week   PT Duration 12 weeks   PT Treatment/Interventions Manual techniques;Manual lymph drainage   PT Next Visit Plan soft tissue work, manual lymph drainage   PT Home Exercise Plan progress as needed   Consulted and Agree with Plan of Care Patient   PT Plan Continue manual lymph drainage, soft tissue work.      Patient will benefit from skilled therapeutic intervention in order to improve the following deficits and impairments:  Pain, Increased muscle spasms, Difficulty walking, Increased edema  Visit Diagnosis: Pain in right hip  Other muscle spasm     Problem List Patient Active Problem List   Diagnosis Date Noted  . 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 Zoster 04/04/2015  . Nausea with vomiting 11/18/2014  . Constipation 11/18/2014  . Left-sided thoracic back pain   . Bone metastases ([place]) 11/16/2014  . Back pain 11/15/2014  . Uncontrolled pain 11/14/2014  . 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 Chest wall pain 03/21/2014  . Abnormal LFTs (liver function tests) 09/12/2013  . Breast cancer of upper-inner quadrant of right female breast ([name]) 08/18/2013  . 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 [place], [place], [address] Phone: [phone]   Fax:  [phone]  Name: AMINATA COONS MRN: [id number] Date of Birth: 02-Jul-1960

## 2015-07-16 ENCOUNTER — Other Ambulatory Visit: Payer: Self-pay

## 2015-07-16 ENCOUNTER — Telehealth: Payer: Self-pay | Admitting: Oncology

## 2015-07-16 ENCOUNTER — Other Ambulatory Visit (HOSPITAL_BASED_OUTPATIENT_CLINIC_OR_DEPARTMENT_OTHER): Payer: 59

## 2015-07-16 ENCOUNTER — Ambulatory Visit (HOSPITAL_COMMUNITY)
Admission: RE | Admit: 2015-07-16 | Discharge: 2015-07-16 | Disposition: A | Discharge status: Home / Self Care | Payer: 59 | Source: Ambulatory Visit | Attending: Oncology | Admitting: Oncology

## 2015-07-16 ENCOUNTER — Other Ambulatory Visit: Payer: Self-pay | Admitting: Oncology

## 2015-07-16 ENCOUNTER — Ambulatory Visit: Payer: 59 | Admitting: Physical Therapy

## 2015-07-16 DIAGNOSIS — C50211 Malignant neoplasm of upper-inner quadrant of right female breast: Secondary | ICD-10-CM | POA: Insufficient documentation

## 2015-07-16 DIAGNOSIS — C771 Secondary and unspecified malignant neoplasm of intrathoracic lymph nodes: Secondary | ICD-10-CM

## 2015-07-16 DIAGNOSIS — J90 Pleural effusion, not elsewhere classified: Secondary | ICD-10-CM

## 2015-07-16 DIAGNOSIS — C7951 Secondary malignant neoplasm of bone: Secondary | ICD-10-CM | POA: Insufficient documentation

## 2015-07-16 DIAGNOSIS — I890 Lymphedema, not elsewhere classified: Secondary | ICD-10-CM | POA: Diagnosis not present

## 2015-07-16 DIAGNOSIS — R938 Abnormal findings on diagnostic imaging of other specified body structures: Secondary | ICD-10-CM | POA: Diagnosis not present

## 2015-07-16 DIAGNOSIS — M79651 Pain in right thigh: Secondary | ICD-10-CM

## 2015-07-16 LAB — CBC WITH DIFFERENTIAL/PLATELET
BASO%: 1.4 % (ref 0.0–2.0)
BASOS ABS: 0.1 10*3/uL (ref 0.0–0.1)
EOS%: 1.6 % (ref 0.0–7.0)
Eosinophils Absolute: 0.1 10*3/uL (ref 0.0–0.5)
HEMATOCRIT: 35.2 % (ref 34.8–46.6)
HEMOGLOBIN: 12.5 g/dL (ref 11.6–15.9)
LYMPH#: 0.9 10*3/uL (ref 0.9–3.3)
LYMPH%: 20.4 % (ref 14.0–49.7)
MCH: 34 pg (ref 25.1–34.0)
MCHC: 35.5 g/dL (ref 31.5–36.0)
MCV: 95.7 fL (ref 79.5–101.0)
MONO#: 0.5 10*3/uL (ref 0.1–0.9)
MONO%: 12.4 % (ref 0.0–14.0)
NEUT#: 2.7 10*3/uL (ref 1.5–6.5)
NEUT%: 64.2 % (ref 38.4–76.8)
Platelets: 280 10*3/uL (ref 145–400)
RBC: 3.68 10*6/uL — ABNORMAL LOW (ref 3.70–5.45)
RDW: 14.5 % (ref 11.2–14.5)
WBC: 4.3 10*3/uL (ref 3.9–10.3)
nRBC: 0 % (ref 0–0)

## 2015-07-16 NOTE — Therapy (Signed)
[place], [place], [address] Phone: [phone]   Fax:  [phone]  Physical Therapy Treatment  Patient Details  Name: [name] [name] MRN: [id number] Date of Birth: 11-27-1960 Referring Provider: Dr. Lurline Del  Encounter Date: 07/16/2015      PT End of Session - 07/16/15 2048    Visit Number 53   Number of Visits 12  [facility]   Date for PT Re-Evaluation 09/17/15  [facility]   Authorization Type April cert for lymphedema therapy done   PT Start Time 1030   PT Stop Time 1109   PT Time Calculation (min) 39 min   Activity Tolerance Patient tolerated treatment well   Behavior During Therapy [facility] for tasks assessed/performed      Past Medical History  Diagnosis Date  . Seizures ([place]) 2010    Isolated incident.  Marland Kitchen PONV (postoperative nausea and vomiting)   . Peripheral vascular disease ([place]) 02/2010    blood clot related to porta cath  . S/P radiation therapy 07/17/2014 through 08/02/2014     Left mediastinum, left seventh rib 3250 cGy in 13 sessions   . S/P radiation therapy 12/11/2014 through 12/22/2014     Left parietal calvarium 2400 cGy in 8 sessions   . Breast cancer ([place]) dx'd 2005/2011  . Bone metastases ([place]) dx'd 05/2014    Past Surgical History  Procedure Laterality Date  . Breast lumpectomy  2005  . Axillary lymph node dissection  Dec. 2011  . Portacath placement  12/11  . Removal portacath    . Mediastinotomy chamberlain mcneil Left 06/02/2013    Procedure: MEDIASTINOTOMY CHAMBERLAIN MCNEIL;  Surgeon: [name], MD;  Location: [place];  Service: Thoracic;  Laterality: Left;  LEFT ANTERIOR MEDIASTINOTOMY     There were no vitals filed for this visit.      Subjective Assessment - 07/16/15 1034    Subjective  Twisted when I got out of the car yesterday.  Hurt the groin area.   Currently in Pain? Yes   Pain Score 5    Pain Location Groin   Pain Orientation Right   Pain Score 6   Pain Location Axilla   Pain Orientation Right   Aggravating Factors  more swelling   Pain Relieving Factors therapy                         [facility] Adult PT Treatment/Exercise - 07/16/15 0001    Manual Therapy   Myofascial Release right axilla with focus on scar tightness   Manual Lymphatic Drainage (MLD) In left sidelying, posterior interaxillary anastomosis and right axillo-inguinal anastomosis; in supine, short neck, left axilla and anterior interaxillary anastomosis, right groin and axillo-inguinal anastomosis, area between right breast scars, directing toward pathways, and right upper arm.  In right sidelying, left periscapular area toward left groin.   Other Manual Therapy soft tissue work at superior and entire aspect of right medial thigh in right sidelying for pain relief and muscle relaxation                  PT Short Term Goals - 02/27/15 1011    PT SHORT TERM GOAL #1   Title pain with walking decreased >/= 25%   Time 3   Period Weeks   Status Achieved   PT SHORT TERM GOAL #2   Title pelvis stays in correct alignment for 3 weeks   Time 3   Period  Weeks   Status Achieved           PT Long Term Goals - 07/10/15 1315    PT LONG TERM GOAL #1   Title indpendent with HEP   Time 12   Period Weeks   Status On-going  still learning    PT LONG TERM GOAL #2   Title pain with walking decreased >/= 80%   Time 12   Period Weeks   Status On-going   PT LONG TERM GOAL #3   Title go up and down steps with a step over step pattern   Time 6   Period Weeks   Status Deferred   PT LONG TERM GOAL #4   Title being able to last for 5 days without therapy and pain not going above a 8/10 due to decreased muscle soreness   Time 12   Period [facility] Goals - 07/16/15 2051    CC Long Term Goal  #2   Title Pt. will report swelling is adequately managed to enable ADL function at a consistent level.   Status On-going   CC Long Term Goal  #4   Title Pain/discomfort at right axilla area will be controlled at 6/10 or less.   Status On-going            Plan - 07/16/15 2049    Clinical Impression Statement Ongoing swelling at upper outer breast that cannot be treated with Flexitouch pump due to groin pain/pubic fracture that keeps her from being able to use the trunk piece of that equipment.  Ongoing right groin pain from fracture that results in muscle tightness that is painful and benefits from soft tissue work.   Rehab Potential Good   Clinical Impairments Affecting Rehab Potential active cancer   PT Frequency 2x / week   PT Duration 12 weeks   PT Treatment/Interventions Manual techniques;Manual lymph drainage   PT Next Visit Plan soft tissue work, manual lymph drainage   Consulted and Agree with Plan of Care Patient      Patient will benefit from skilled therapeutic intervention in order to improve the following deficits and impairments:  Pain, Increased muscle spasms, Difficulty walking, Increased edema  Visit Diagnosis: Lymphedema, not elsewhere classified  Pain in right thigh     Problem List Patient Active Problem List   Diagnosis Date Noted  . 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 Zoster 04/04/2015  . Nausea with vomiting 11/18/2014  . Constipation 11/18/2014  . Left-sided thoracic back pain   . Bone metastases ([place]) 11/16/2014  . Back pain 11/15/2014  . Uncontrolled pain 11/14/2014  . 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 Chest wall pain 03/21/2014  . Abnormal LFTs (liver function tests) 09/12/2013  . Breast cancer of upper-inner quadrant of right female breast ([place]) 08/18/2013  . 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

## 2015-07-16 NOTE — Progress Notes (Signed)
Patient started back on Ibrance 07/15/15.  RN noted in oral chemo book.  POF placed per patients request for 08/06/15 at 2:00.

## 2015-07-16 NOTE — Telephone Encounter (Signed)
Made appt 5/8 for iv access at 2 pm per 4/17 pof

## 2015-07-17 ENCOUNTER — Encounter: Payer: Self-pay | Admitting: Physical Therapy

## 2015-07-17 ENCOUNTER — Ambulatory Visit: Payer: 59 | Admitting: Physical Therapy

## 2015-07-17 DIAGNOSIS — M25551 Pain in right hip: Secondary | ICD-10-CM | POA: Diagnosis not present

## 2015-07-17 NOTE — Therapy (Signed)
[facility] [address] [place], [place], [address] Phone: [phone]   Fax:  [phone]  Physical Therapy Treatment  Patient Details  Name: [name] [name] MRN: [id number] Date of Birth: 1961-02-12 Referring Provider: Dr. Lurline Del  Encounter Date: 07/17/2015      PT End of Session - 07/17/15 1312    Visit Number 68  pelvic   Number of Visits 18   Date for PT Re-Evaluation 09/17/15   Authorization Type April cert for lymphedema therapy done   Authorization - Visit Number 140   Authorization - Number of Visits 27   PT Start Time 1230   PT Stop Time 1310   PT Time Calculation (min) 40 min   Activity Tolerance Patient tolerated treatment well   Behavior During Therapy [facility] for tasks assessed/performed      Past Medical History  Diagnosis Date  . Seizures ([place]) 2010    Isolated incident.  Marland Kitchen PONV (postoperative nausea and vomiting)   . Peripheral vascular disease ([place]) 02/2010    blood clot related to porta cath  . S/P radiation therapy 07/17/2014 through 08/02/2014     Left mediastinum, left seventh rib 3250 cGy in 13 sessions   . S/P radiation therapy 12/11/2014 through 12/22/2014     Left parietal calvarium 2400 cGy in 8 sessions   . Breast cancer ([place]) dx'd 2005/2011  . Bone metastases ([place]) dx'd 05/2014    Past Surgical History  Procedure Laterality Date  . Breast lumpectomy  2005  . Axillary lymph node dissection  Dec. 2011  . Portacath placement  12/11  . Removal portacath    . Mediastinotomy chamberlain mcneil Left 06/02/2013    Procedure: MEDIASTINOTOMY CHAMBERLAIN MCNEIL;  Surgeon: [name], MD;  Location: [place];  Service: Thoracic;  Laterality: Left;  LEFT ANTERIOR MEDIASTINOTOMY     There were no vitals filed for this  visit.      Subjective Assessment - 07/17/15 1239    Subjective I am hurting today.  The other things in the pelvis looks stable. Do not know if fracture is healing.   Pertinent History patient vomited just prior to treatment today   How long can you walk comfortably? make patient limp   Diagnostic tests x rays of back show no fractures;    Patient Stated Goals walk 20 minutes with minimal limp   Currently in Pain? Yes   Pain Score 9    Pain Location Groin   Pain Orientation Right   Pain Descriptors / Indicators Constant   Pain Type Chronic pain   Pain Onset More than a month ago   Pain Frequency Constant   Aggravating Factors  walking and moving wrong   Pain Relieving Factors soft tissue work   Effect of Pain on Daily Activities walk with a limp                         [facility] Adult PT Treatment/Exercise - 07/17/15 0001    Manual Therapy   Manual Therapy Soft tissue mobilization;Internal Pelvic Floor   Soft tissue mobilization right hamstring, rightn iliotibial band, right gluteal; right hip adductor and quad   Internal Pelvic Floor bil. levator ani, right obturator internist; posterior furchette, right side of urethra                PT Education - 07/17/15 1312    Education provided No  PT Short Term Goals - 02/27/15 1011    PT SHORT TERM GOAL #1   Title pain with walking decreased >/= 25%   Time 3   Period Weeks   Status Achieved   PT SHORT TERM GOAL #2   Title pelvis stays in correct alignment for 3 weeks   Time 3   Period Weeks   Status Achieved           PT Long Term Goals - 07/17/15 1315    PT LONG TERM GOAL #1   Title indpendent with HEP   Time 12   Status On-going   PT LONG TERM GOAL #2   Title pain with walking decreased >/= 80%   Time 12   Period Weeks   Status On-going   PT LONG TERM GOAL #3   Title go up and down steps with a step over step pattern   Time 6   Period Weeks   Status Deferred   PT LONG TERM  GOAL #4   Title being able to last for 5 days without therapy and pain not going above a 8/10 due to decreased muscle soreness   Time 12   Period Weeks   Status New           Long Term Clinic Goals - 07/16/15 2051    CC Long Term Goal  #2   Title Pt. will report swelling is adequately managed to enable ADL function at a consistent level.   Status On-going   CC Long Term Goal  #4   Title Pain/discomfort at right axilla area will be controlled at 6/10 or less.   Status On-going            Plan - 07/17/15 1313    Clinical Impression Statement Patient had less tenderness on the soft tissue in vaginal area but more tenderness located on right ischium. Patient reports pain decreased after visit.  Patient is going to try coming to therapy 1 tme per week.    Rehab Potential Good   Clinical Impairments Affecting Rehab Potential active cancer   PT Frequency 2x / week   PT Duration 12 weeks   PT Treatment/Interventions Manual techniques;Manual lymph drainage   PT Next Visit Plan soft tissue work, manual lymph drainage   PT Home Exercise Plan progress as needed   Consulted and Agree with Plan of Care Patient   PT Plan Continue manual lymph drainage, soft tissue work.      Patient will benefit from skilled therapeutic intervention in order to improve the following deficits and impairments:  Pain, Increased muscle spasms, Difficulty walking, Increased edema  Visit Diagnosis: Pain in right hip     Problem List Patient Active Problem List   Diagnosis Date Noted  . 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 Zoster 04/04/2015  . Nausea with vomiting 11/18/2014  . Constipation 11/18/2014  . Left-sided thoracic back pain   . Bone metastases ([place]) 11/16/2014  . Back pain 11/15/2014  . Uncontrolled pain 11/14/2014  . 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 Chest wall pain 03/21/2014  . Abnormal LFTs (liver function tests) 09/12/2013  . Breast cancer of  upper-inner quadrant of right female breast ([place]) 08/18/2013  . 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 [place], [place], [address] Phone: [phone]   Fax:  [phone]  Name: [name] [name]  [name] [name] MRN: [id number] Date of Birth: Aug 19, 1960

## 2015-07-19 ENCOUNTER — Encounter: Payer: 59 | Admitting: Physical Therapy

## 2015-07-20 ENCOUNTER — Ambulatory Visit: Payer: 59 | Admitting: Physical Therapy

## 2015-07-20 DIAGNOSIS — I890 Lymphedema, not elsewhere classified: Secondary | ICD-10-CM

## 2015-07-20 DIAGNOSIS — M79651 Pain in right thigh: Secondary | ICD-10-CM

## 2015-07-20 NOTE — Therapy (Signed)
[place], [place], [address] Phone: [phone]   Fax:  [phone]  Physical Therapy Treatment  Patient Details  Name: [name] [name] MRN: [id number] Date of Birth: 06/12/1960 Referring Provider: Dr. Lurline Del  Encounter Date: 07/20/2015      PT End of Session - 07/20/15 1224    Visit Number 71  total   Number of Visits 76  lymph   Date for PT Re-Evaluation 09/17/15   Authorization Type April cert for lymphedema therapy done   PT Start Time 1021   PT Stop Time 1109   PT Time Calculation (min) 48 min   Activity Tolerance Patient tolerated treatment well   Behavior During Therapy [facility] for tasks assessed/performed      Past Medical History  Diagnosis Date  . Seizures ([place]) 2010    Isolated incident.  Marland Kitchen PONV (postoperative nausea and vomiting)   . Peripheral vascular disease ([place]) 02/2010    blood clot related to porta cath  . S/P radiation therapy 07/17/2014 through 08/02/2014     Left mediastinum, left seventh rib 3250 cGy in 13 sessions   . S/P radiation therapy 12/11/2014 through 12/22/2014     Left parietal calvarium 2400 cGy in 8 sessions   . Breast cancer ([place]) dx'd 2005/2011  . Bone metastases ([place]) dx'd 05/2014    Past Surgical History  Procedure Laterality Date  . Breast lumpectomy  2005  . Axillary lymph node dissection  Dec. 2011  . Portacath placement  12/11  . Removal portacath    . Mediastinotomy chamberlain mcneil Left 06/02/2013    Procedure: MEDIASTINOTOMY CHAMBERLAIN MCNEIL;  Surgeon: [name], MD;  Location: [place] [facility];  Service: Thoracic;  Laterality: Left;  LEFT ANTERIOR MEDIASTINOTOMY     There were no vitals filed for this visit.      Subjective Assessment - 07/20/15 1022    Subjective X-ray of  pubis was inconclusive.  Had a lot of pain in that area last night and couldn't sleep.   Currently in Pain? Yes   Pain Score 6   and flares higher   Pain Location Groin   Pain Orientation Right   Pain Descriptors / Indicators Constant   Aggravating Factors  adjusting direction when walking or up on feet   Pain Relieving Factors time helps after a flare   Pain Score 6   Pain Location Axilla   Pain Orientation Right   Aggravating Factors  humidity   Pain Relieving Factors therapy                         [facility] Adult PT Treatment/Exercise - 07/20/15 0001    Manual Therapy   Myofascial Release right axilla with focus on scar tightness   Manual Lymphatic Drainage (MLD) In left sidelying, posterior interaxillary anastomosis and right axillo-inguinal anastomosis; in supine, short neck, left axilla and anterior interaxillary anastomosis, right groin and axillo-inguinal anastomosis, area between right breast scars, directing toward pathways, and right upper arm.  In right sidelying, left periscapular area toward left groin.   Other Manual Therapy soft tissue work at superior and entire aspect of right medial thigh in right sidelying for pain relief and muscle relaxation                  PT Short Term Goals - 02/27/15 1011    PT SHORT TERM GOAL #1   Title pain with walking decreased >/= 25%  Time 3   Period Weeks   Status Achieved   PT SHORT TERM GOAL #2   Title pelvis stays in correct alignment for 3 weeks   Time 3   Period Weeks   Status Achieved           PT Long Term Goals - 07/17/15 1315    PT LONG TERM GOAL #1   Title indpendent with HEP   Time 12   Status On-going   PT LONG TERM GOAL #2   Title pain with walking decreased >/= 80%   Time 12   Period Weeks   Status On-going   PT LONG TERM GOAL #3   Title go up and down steps with a step over step pattern   Time 6   Period Weeks   Status Deferred   PT LONG TERM GOAL #4   Title being able  to last for 5 days without therapy and pain not going above a 8/10 due to decreased muscle soreness   Time 12   Period Weeks   Status New           Long Term Clinic Goals - 07/20/15 1227    CC Long Term Goal  #2   Title Pt. will report swelling is adequately managed to enable ADL function at a consistent level.   Status On-going   CC Long Term Goal  #4   Title Pain/discomfort at right axilla area will be controlled at 6/10 or less.   Status On-going            Plan - 07/20/15 1225    Clinical Impression Statement Continues to have flare-ups in both right breast/axilla swelling and discomfort and in right groin area discomfort in between therapy sessions, and these are both helped with manual therapy techniques.   Rehab Potential Good   Clinical Impairments Affecting Rehab Potential active cancer   PT Frequency 2x / week   PT Duration 12 weeks   PT Treatment/Interventions Manual techniques;Manual lymph drainage   PT Next Visit Plan soft tissue work, manual lymph drainage   Consulted and Agree with Plan of Care Patient      Patient will benefit from skilled therapeutic intervention in order to improve the following deficits and impairments:  Pain, Increased muscle spasms, Difficulty walking, Increased edema  Visit Diagnosis: Lymphedema, not elsewhere classified  Pain in right thigh     Problem List Patient Active Problem List   Diagnosis Date Noted  . 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 Zoster 04/04/2015  . Nausea with vomiting 11/18/2014  . Constipation 11/18/2014  . Left-sided thoracic back pain   . Bone metastases ([place]) 11/16/2014  . Back pain 11/15/2014  . Uncontrolled pain 11/14/2014  . 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 Chest wall pain 03/21/2014  . Abnormal LFTs (liver function tests) 09/12/2013  . Breast cancer of upper-inner quadrant of right female breast ([place]) 08/18/2013  . Secondary malignant neoplasm of mediastinal  lymph node ([place]) 08/18/2013    [name],[name] 07/20/2015, 12:29 PM  [place], [place], [address] Phone: [phone]   Fax:  [phone]  Name: REMINISCE CERASOLI MRN: [id number] Date of Birth: Jan 16, 1961    [name], PT 07/20/2015 12:29 PM

## 2015-07-23 ENCOUNTER — Other Ambulatory Visit (HOSPITAL_BASED_OUTPATIENT_CLINIC_OR_DEPARTMENT_OTHER): Payer: 59

## 2015-07-23 ENCOUNTER — Ambulatory Visit: Payer: 59 | Admitting: Physical Therapy

## 2015-07-23 DIAGNOSIS — J90 Pleural effusion, not elsewhere classified: Secondary | ICD-10-CM

## 2015-07-23 DIAGNOSIS — M79651 Pain in right thigh: Secondary | ICD-10-CM

## 2015-07-23 DIAGNOSIS — I890 Lymphedema, not elsewhere classified: Secondary | ICD-10-CM | POA: Diagnosis not present

## 2015-07-23 DIAGNOSIS — C771 Secondary and unspecified malignant neoplasm of intrathoracic lymph nodes: Secondary | ICD-10-CM | POA: Diagnosis not present

## 2015-07-23 DIAGNOSIS — C50211 Malignant neoplasm of upper-inner quadrant of right female breast: Secondary | ICD-10-CM | POA: Diagnosis not present

## 2015-07-23 LAB — CBC WITH DIFFERENTIAL/PLATELET
BASO%: 1.2 % (ref 0.0–2.0)
Basophils Absolute: 0.1 10*3/uL (ref 0.0–0.1)
EOS ABS: 0.1 10*3/uL (ref 0.0–0.5)
EOS%: 2 % (ref 0.0–7.0)
HEMATOCRIT: 36.5 % (ref 34.8–46.6)
HGB: 12.8 g/dL (ref 11.6–15.9)
LYMPH#: 0.8 10*3/uL — AB (ref 0.9–3.3)
LYMPH%: 18.7 % (ref 14.0–49.7)
MCH: 34 pg (ref 25.1–34.0)
MCHC: 35.1 g/dL (ref 31.5–36.0)
MCV: 96.8 fL (ref 79.5–101.0)
MONO#: 0.3 10*3/uL (ref 0.1–0.9)
MONO%: 8.4 % (ref 0.0–14.0)
NEUT%: 69.7 % (ref 38.4–76.8)
NEUTROS ABS: 2.8 10*3/uL (ref 1.5–6.5)
NRBC: 0 % (ref 0–0)
PLATELETS: 322 10*3/uL (ref 145–400)
RBC: 3.77 10*6/uL (ref 3.70–5.45)
RDW: 14.3 % (ref 11.2–14.5)
WBC: 4.1 10*3/uL (ref 3.9–10.3)

## 2015-07-23 NOTE — Therapy (Signed)
[place], [place], [address] Phone: [phone]   Fax:  [phone]  Physical Therapy Treatment  Patient Details  Name: [name] [name] MRN: [id number] Date of Birth: Jul 15, 1960 Referring Provider: Dr. Lurline Del  Encounter Date: 07/23/2015      PT End of Session - 07/23/15 1705    Visit Number 48  total   Number of Visits 76  lymph   Date for PT Re-Evaluation 09/17/15   Authorization Type April cert for lymphedema therapy done   PT Start Time 1025   PT Stop Time 1106   PT Time Calculation (min) 41 min   Activity Tolerance Patient tolerated treatment well   Behavior During Therapy [facility] for tasks assessed/performed      Past Medical History  Diagnosis Date  . Seizures ([place]) 2010    Isolated incident.  Marland Kitchen PONV (postoperative nausea and vomiting)   . Peripheral vascular disease ([place]) 02/2010    blood clot related to porta cath  . S/P radiation therapy 07/17/2014 through 08/02/2014     Left mediastinum, left seventh rib 3250 cGy in 13 sessions   . S/P radiation therapy 12/11/2014 through 12/22/2014     Left parietal calvarium 2400 cGy in 8 sessions   . Breast cancer ([place]) dx'd 2005/2011  . Bone metastases ([place]) dx'd 05/2014    Past Surgical History  Procedure Laterality Date  . Breast lumpectomy  2005  . Axillary lymph node dissection  Dec. 2011  . Portacath placement  12/11  . Removal portacath    . Mediastinotomy chamberlain mcneil Left 06/02/2013    Procedure: MEDIASTINOTOMY CHAMBERLAIN MCNEIL;  Surgeon: [name], MD;  Location: [place];  Service: Thoracic;  Laterality: Left;  LEFT ANTERIOR MEDIASTINOTOMY     There were no vitals filed for this visit.      Subjective Assessment - 07/23/15 1028    Currently in Pain?  Yes   Pain Score 6   up to 9/10 at times   Pain Location Groin   Pain Orientation Right   Pain Descriptors / Indicators --  can get zingers   Pain Score 6   Pain Location Axilla   Pain Orientation Right   Aggravating Factors  more swelling, humidity   Pain Relieving Factors therapy                         [facility] Adult PT Treatment/Exercise - 07/23/15 0001    Manual Therapy   Myofascial Release right axilla with focus on scar tightness   Manual Lymphatic Drainage (MLD) In left sidelying, posterior interaxillary anastomosis and right axillo-inguinal anastomosis; in supine, short neck, left axilla and anterior interaxillary anastomosis, right groin and axillo-inguinal anastomosis, area between right breast scars, directing toward pathways, and right upper arm.  In right sidelying, left periscapular area toward left groin.   Other Manual Therapy soft tissue work at superior and entire aspect of right medial thigh in right sidelying for pain relief and muscle relaxation; gentle myofascial release                  PT Short Term Goals - 02/27/15 1011    PT SHORT TERM GOAL #1   Title pain with walking decreased >/= 25%   Time 3   Period Weeks   Status Achieved   PT SHORT TERM GOAL #2   Title pelvis stays in correct alignment for 3 weeks   Time 3  Period Weeks   Status Achieved           PT Long Term Goals - 07/17/15 1315    PT LONG TERM GOAL #1   Title indpendent with HEP   Time 12   Status On-going   PT LONG TERM GOAL #2   Title pain with walking decreased >/= 80%   Time 12   Period Weeks   Status On-going   PT LONG TERM GOAL #3   Title go up and down steps with a step over step pattern   Time 6   Period Weeks   Status Deferred   PT LONG TERM GOAL #4   Title being able to last for 5 days without therapy and pain not going above a 8/10 due to decreased muscle soreness   Time 12   Period Weeks   Status New           Long Term Clinic  Goals - 07/23/15 1708    CC Long Term Goal  #2   Title Pt. will report swelling is adequately managed to enable ADL function at a consistent level.   Status On-going   CC Long Term Goal  #4   Title Pain/discomfort at right axilla area will be controlled at 6/10 or less.   Status On-going            Plan - 07/23/15 1706    Clinical Impression Statement Continued fullness right axilla and upper outer breast,and pain at right pubic area, both helped by therapy.   Rehab Potential Good   Clinical Impairments Affecting Rehab Potential active cancer   PT Frequency 2x / week   PT Duration 12 weeks   PT Treatment/Interventions Manual techniques;Manual lymph drainage   PT Next Visit Plan Remeasure chest circumference; soft tissue work, manual lymph drainage   Consulted and Agree with Plan of Care Patient      Patient will benefit from skilled therapeutic intervention in order to improve the following deficits and impairments:  Pain, Increased muscle spasms, Difficulty walking, Increased edema  Visit Diagnosis: Lymphedema, not elsewhere classified  Pain in right thigh     Problem List Patient Active Problem List   Diagnosis Date Noted  . 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 Zoster 04/04/2015  . Nausea with vomiting 11/18/2014  . Constipation 11/18/2014  . Left-sided thoracic back pain   . Bone metastases ([place]) 11/16/2014  . Back pain 11/15/2014  . Uncontrolled pain 11/14/2014  . 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 Chest wall pain 03/21/2014  . Abnormal LFTs (liver function tests) 09/12/2013  . Breast cancer of upper-inner quadrant of right female breast ([place]) 08/18/2013  . 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

## 2015-07-24 ENCOUNTER — Encounter: Payer: Self-pay | Admitting: Physical Therapy

## 2015-07-24 ENCOUNTER — Ambulatory Visit: Payer: 59 | Admitting: Physical Therapy

## 2015-07-24 DIAGNOSIS — M25551 Pain in right hip: Secondary | ICD-10-CM | POA: Diagnosis not present

## 2015-07-24 NOTE — Therapy (Signed)
[facility] [address] [place], [place], [address] Phone: [phone]   Fax:  [phone]  Physical Therapy Treatment  Patient Details  Name: [name] [name] MRN: [id number] Date of Birth: [birth year] Referring Provider: Dr. Lurline Del  Encounter Date: 07/24/2015      PT End of Session - 07/24/15 1309    Visit Number 2   Date for PT Re-Evaluation 09/17/15   Authorization - Visit Number 140   Authorization - Number of Visits 15   PT Start Time 1230   PT Stop Time 1310   PT Time Calculation (min) 40 min   Activity Tolerance Patient tolerated treatment well   Behavior During Therapy [facility] for tasks assessed/performed      Past Medical History  Diagnosis Date  . Seizures ([place]) 2010    Isolated incident.  Marland Kitchen PONV (postoperative nausea and vomiting)   . Peripheral vascular disease ([place]) 02/2010    blood clot related to porta cath  . S/P radiation therapy 07/17/2014 through 08/02/2014     Left mediastinum, left seventh rib 3250 cGy in 13 sessions   . S/P radiation therapy 12/11/2014 through 12/22/2014     Left parietal calvarium 2400 cGy in 8 sessions   . Breast cancer ([place]) dx'd 2005/2011  . Bone metastases ([place]) dx'd 05/2014    Past Surgical History  Procedure Laterality Date  . Breast lumpectomy  2005  . Axillary lymph node dissection  Dec. 2011  . Portacath placement  12/11  . Removal portacath    . Mediastinotomy chamberlain mcneil Left 06/02/2013    Procedure: MEDIASTINOTOMY CHAMBERLAIN MCNEIL;  Surgeon: [name], MD;  Location: [place];  Service: Thoracic;  Laterality: Left;  LEFT ANTERIOR MEDIASTINOTOMY     There were no vitals filed for this visit.      Subjective Assessment - 07/24/15 1233    Subjective I feel like I can go a  week inbetween therapy at this time.    How long can you walk comfortably? make patient limp   Patient Stated Goals walk 20 minutes with minimal limp   Currently in Pain? Yes   Pain Score 6    Pain Location Groin   Pain Orientation Right   Pain Descriptors / Indicators Burning  can get zingers   Pain Type Chronic pain   Pain Onset More than a month ago   Pain Frequency Constant   Aggravating Factors  adjusting direction when walking or up on feet   Pain Relieving Factors time helps   Effect of Pain on Daily Activities walk with  a limp                      Pelvic Floor Special Questions - 07/24/15 0001    Pelvic Floor Internal Exam Patient approves physical therapist to perform pelvic floor muscle assessment   Exam Type Vaginal           [facility] Adult PT Treatment/Exercise - 07/24/15 0001    Manual Therapy   Manual Therapy Soft tissue mobilization;Internal Pelvic Floor   Soft tissue mobilization right hamstring, righ thip adductor, right gluteal, around right hip in left sidely   Internal Pelvic Floor bil. levator ani, right obturator internist; posterior furchette, right side of urethra                PT Education - 07/24/15 1309    Education provided No  PT Short Term Goals - 02/27/15 1011    PT SHORT TERM GOAL #1   Title pain with walking decreased >/= 25%   Time 3   Period Weeks   Status Achieved   PT SHORT TERM GOAL #2   Title pelvis stays in correct alignment for 3 weeks   Time 3   Period Weeks   Status Achieved           PT Long Term Goals - 07/24/15 1311    PT LONG TERM GOAL #1   Title indpendent with HEP   Time 12   Period Weeks   Status On-going   PT LONG TERM GOAL #2   Title pain with walking decreased >/= 80%   Time 12   Period Weeks   Status On-going   PT LONG TERM GOAL #3   Title go up and down steps with a step over step pattern   Time 6   Period Weeks   Status Deferred   PT LONG TERM GOAL #4   Title  being able to last for 5 days without therapy and pain not going above a 8/10 due to decreased muscle soreness   Time 12   Period Weeks   Status On-going           Long Term Clinic Goals - 07/23/15 1708    CC Long Term Goal  #2   Title Pt. will report swelling is adequately managed to enable ADL function at a consistent level.   Status On-going   CC Long Term Goal  #4   Title Pain/discomfort at right axilla area will be controlled at 6/10 or less.   Status On-going            Plan - 07/24/15 1309    Clinical Impression Statement Patient did not have tenderness located in left pelvic floor.  She had 75% reduction of right pelvic floor tenderness with soft tissue work. After therapy patient pain decreased from 6/10 to 3/10.  Patient is abl eto go for 1 week without therapy for first time.    Rehab Potential Good   Clinical Impairments Affecting Rehab Potential active cancer   PT Frequency 2x / week   PT Duration 12 weeks   PT Treatment/Interventions Manual techniques;Manual lymph drainage   PT Next Visit Plan Remeasure chest circumference; soft tissue work, manual lymph drainage   PT Home Exercise Plan progress as needed   Consulted and Agree with Plan of Care Patient   PT Plan Continue manual lymph drainage, soft tissue work.      Patient will benefit from skilled therapeutic intervention in order to improve the following deficits and impairments:  Pain, Increased muscle spasms, Difficulty walking, Increased edema  Visit Diagnosis: Pain in right hip     Problem List Patient Active Problem List   Diagnosis Date Noted  . 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 Zoster 04/04/2015  . Nausea with vomiting 11/18/2014  . Constipation 11/18/2014  . Left-sided thoracic back pain   . Bone metastases ([place]) 11/16/2014  . Back pain 11/15/2014  . Uncontrolled pain 11/14/2014  . 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 Chest wall pain 03/21/2014  .  Abnormal LFTs (liver function tests) 09/12/2013  . Breast cancer of upper-inner quadrant of right female breast ([place]) 08/18/2013  . 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  [place], [place], [place], [address] Phone: [phone]   Fax:  [phone]  Name: AMILAH KOPELMAN MRN: [id number] Date of Birth: September 26, 1960

## 2015-07-26 ENCOUNTER — Encounter: Payer: 59 | Admitting: Physical Therapy

## 2015-07-27 ENCOUNTER — Ambulatory Visit: Payer: 59 | Admitting: Physical Therapy

## 2015-07-27 DIAGNOSIS — I890 Lymphedema, not elsewhere classified: Secondary | ICD-10-CM

## 2015-07-27 DIAGNOSIS — M79651 Pain in right thigh: Secondary | ICD-10-CM

## 2015-07-27 NOTE — Therapy (Signed)
[place], [place], [address] Phone: [phone]   Fax:  [phone]  Physical Therapy Treatment  Patient Details  Name: [name] [name] MRN: [id number] Date of Birth: Jun 01, 1960 Referring Provider: Dr. Lurline Del  Encounter Date: 07/27/2015      PT End of Session - 07/27/15 1228    Visit Number 52   Number of Visits 48  lymph   Date for PT Re-Evaluation 09/17/15   PT Start Time 1024   PT Stop Time 1106   PT Time Calculation (min) 42 min   Activity Tolerance Patient tolerated treatment well   Behavior During Therapy [facility] for tasks assessed/performed      Past Medical History  Diagnosis Date  . Seizures ([place]) 2010    Isolated incident.  Marland Kitchen PONV (postoperative nausea and vomiting)   . Peripheral vascular disease ([place]) 02/2010    blood clot related to porta cath  . S/P radiation therapy 07/17/2014 through 08/02/2014     Left mediastinum, left seventh rib 3250 cGy in 13 sessions   . S/P radiation therapy 12/11/2014 through 12/22/2014     Left parietal calvarium 2400 cGy in 8 sessions   . Breast cancer ([place]) dx'd 2005/2011  . Bone metastases ([place]) dx'd 05/2014    Past Surgical History  Procedure Laterality Date  . Breast lumpectomy  2005  . Axillary lymph node dissection  Dec. 2011  . Portacath placement  12/11  . Removal portacath    . Mediastinotomy chamberlain mcneil Left 06/02/2013    Procedure: MEDIASTINOTOMY CHAMBERLAIN MCNEIL;  Surgeon: [name], MD;  Location: [place];  Service: Thoracic;  Laterality: Left;  LEFT ANTERIOR MEDIASTINOTOMY     There were no vitals filed for this visit.      Subjective Assessment - 07/27/15 1025    Subjective I am cutting down on my pelvic therapy to once a week to see if that works  out.  Friend who has cancer had a PET scan which showed 30% growth in tumors in the last month.   Currently in Pain? Yes   Pain Score 5    Pain Location Groin   Pain Orientation Right   Pain Score 6   Pain Location Axilla   Pain Orientation Right                         [facility] Adult PT Treatment/Exercise - 07/27/15 0001    Manual Therapy   Myofascial Release right axilla with focus on scar tightness   Manual Lymphatic Drainage (MLD) In left sidelying, posterior interaxillary anastomosis and right axillo-inguinal anastomosis; in supine, short neck, left axilla and anterior interaxillary anastomosis, right groin and axillo-inguinal anastomosis, area between right breast scars, directing toward pathways, and right upper arm.  In right sidelying, left periscapular area toward left groin.   Other Manual Therapy soft tissue work at superior and entire aspect of right medial thigh in right sidelying for pain relief and muscle relaxation; gentle myofascial release                  PT Short Term Goals - 02/27/15 1011    PT SHORT TERM GOAL #1   Title pain with walking decreased >/= 25%   Time 3   Period Weeks   Status Achieved   PT SHORT TERM GOAL #2   Title pelvis stays in correct alignment for 3 weeks   Time 3   Period Weeks  Status Achieved           PT Long Term Goals - 07/24/15 1311    PT LONG TERM GOAL #1   Title indpendent with HEP   Time 12   Period Weeks   Status On-going   PT LONG TERM GOAL #2   Title pain with walking decreased >/= 80%   Time 12   Period Weeks   Status On-going   PT LONG TERM GOAL #3   Title go up and down steps with a step over step pattern   Time 6   Period Weeks   Status Deferred   PT LONG TERM GOAL #4   Title being able to last for 5 days without therapy and pain not going above a 8/10 due to decreased muscle soreness   Time 12   Period Weeks   Status On-going           Long Term Clinic Goals - 07/27/15  1230    CC Long Term Goal  #2   Title Pt. will report swelling is adequately managed to enable ADL function at a consistent level.   Status On-going   CC Long Term Goal  #4   Title Pain/discomfort at right axilla area will be controlled at 6/10 or less.   Status On-going            Plan - 07/27/15 1228    Clinical Impression Statement Continues to have fullness at right upper outer breast that benefits from therapy; also with right thigh/groin pain.   Rehab Potential Good   Clinical Impairments Affecting Rehab Potential active cancer   PT Frequency 2x / week   PT Duration 12 weeks   PT Treatment/Interventions Manual techniques;Manual lymph drainage   PT Next Visit Plan Remeasure chest circumference; soft tissue work, manual lymph drainage   Consulted and Agree with Plan of Care Patient      Patient will benefit from skilled therapeutic intervention in order to improve the following deficits and impairments:  Pain, Increased muscle spasms, Difficulty walking, Increased edema  Visit Diagnosis: Lymphedema, not elsewhere classified  Pain in right thigh     Problem List Patient Active Problem List   Diagnosis Date Noted  . 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 Zoster 04/04/2015  . Nausea with vomiting 11/18/2014  . Constipation 11/18/2014  . Left-sided thoracic back pain   . Bone metastases ([place]) 11/16/2014  . Back pain 11/15/2014  . Uncontrolled pain 11/14/2014  . 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 Chest wall pain 03/21/2014  . Abnormal LFTs (liver function tests) 09/12/2013  . Breast cancer of upper-inner quadrant of right female breast ([place]) 08/18/2013  . 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

## 2015-07-30 ENCOUNTER — Encounter: Payer: 59 | Admitting: Physical Therapy

## 2015-07-30 ENCOUNTER — Ambulatory Visit: Payer: 59 | Attending: Oncology | Admitting: Physical Therapy

## 2015-07-30 ENCOUNTER — Other Ambulatory Visit (HOSPITAL_BASED_OUTPATIENT_CLINIC_OR_DEPARTMENT_OTHER): Payer: 59

## 2015-07-30 DIAGNOSIS — C771 Secondary and unspecified malignant neoplasm of intrathoracic lymph nodes: Secondary | ICD-10-CM

## 2015-07-30 DIAGNOSIS — I890 Lymphedema, not elsewhere classified: Secondary | ICD-10-CM | POA: Diagnosis not present

## 2015-07-30 DIAGNOSIS — C50211 Malignant neoplasm of upper-inner quadrant of right female breast: Secondary | ICD-10-CM | POA: Diagnosis not present

## 2015-07-30 DIAGNOSIS — M25552 Pain in left hip: Secondary | ICD-10-CM | POA: Diagnosis present

## 2015-07-30 DIAGNOSIS — M79651 Pain in right thigh: Secondary | ICD-10-CM | POA: Insufficient documentation

## 2015-07-30 DIAGNOSIS — C7951 Secondary malignant neoplasm of bone: Secondary | ICD-10-CM

## 2015-07-30 DIAGNOSIS — J90 Pleural effusion, not elsewhere classified: Secondary | ICD-10-CM

## 2015-07-30 LAB — CBC WITH DIFFERENTIAL/PLATELET
BASO%: 1 % (ref 0.0–2.0)
Basophils Absolute: 0 10*3/uL (ref 0.0–0.1)
EOS%: 1.5 % (ref 0.0–7.0)
Eosinophils Absolute: 0.1 10*3/uL (ref 0.0–0.5)
HEMATOCRIT: 37.1 % (ref 34.8–46.6)
HEMOGLOBIN: 12.8 g/dL (ref 11.6–15.9)
LYMPH#: 0.6 10*3/uL — AB (ref 0.9–3.3)
LYMPH%: 18.4 % (ref 14.0–49.7)
MCH: 34.2 pg — ABNORMAL HIGH (ref 25.1–34.0)
MCHC: 34.5 g/dL (ref 31.5–36.0)
MCV: 99.2 fL (ref 79.5–101.0)
MONO#: 0.3 10*3/uL (ref 0.1–0.9)
MONO%: 9.5 % (ref 0.0–14.0)
NEUT#: 2.5 10*3/uL (ref 1.5–6.5)
NEUT%: 69.6 % (ref 38.4–76.8)
Platelets: 332 10*3/uL (ref 145–400)
RBC: 3.74 10*6/uL (ref 3.70–5.45)
RDW: 14.3 % (ref 11.2–14.5)
WBC: 3.5 10*3/uL — AB (ref 3.9–10.3)

## 2015-07-30 LAB — COMPREHENSIVE METABOLIC PANEL
ALT: 30 U/L (ref 0–55)
ANION GAP: 7 meq/L (ref 3–11)
AST: 23 U/L (ref 5–34)
Albumin: 4 g/dL (ref 3.5–5.0)
Alkaline Phosphatase: 68 U/L (ref 40–150)
BILIRUBIN TOTAL: 0.37 mg/dL (ref 0.20–1.20)
BUN: 13.8 mg/dL (ref 7.0–26.0)
CHLORIDE: 102 meq/L (ref 98–109)
CO2: 28 meq/L (ref 22–29)
CREATININE: 0.9 mg/dL (ref 0.6–1.1)
Calcium: 9.7 mg/dL (ref 8.4–10.4)
EGFR: 73 mL/min/{1.73_m2} — ABNORMAL LOW (ref >90)
GLUCOSE: 81 mg/dL (ref 70–140)
Potassium: 4 mEq/L (ref 3.5–5.1)
Sodium: 137 mEq/L (ref 136–145)
Total Protein: 7.3 g/dL (ref 6.4–8.3)

## 2015-07-30 NOTE — Therapy (Signed)
[place], [place], [address] Phone: [phone]   Fax:  [phone]  Physical Therapy Treatment  Patient Details  Name: [name] [name] MRN: [id number] Date of Birth: 07-06-1960 Referring Provider: Dr. Lurline Del  Encounter Date: 07/30/2015      PT End of Session - 07/30/15 1313    Visit Number 74   Number of Visits 48  lymph   Date for PT Re-Evaluation 09/17/15   PT Start Time 1020   PT Stop Time 1101   PT Time Calculation (min) 41 min   Activity Tolerance Patient tolerated treatment well   Behavior During Therapy [facility] for tasks assessed/performed      Past Medical History  Diagnosis Date  . Seizures ([place]) 2010    Isolated incident.  Marland Kitchen PONV (postoperative nausea and vomiting)   . Peripheral vascular disease ([place]) 02/2010    blood clot related to porta cath  . S/P radiation therapy 07/17/2014 through 08/02/2014     Left mediastinum, left seventh rib 3250 cGy in 13 sessions   . S/P radiation therapy 12/11/2014 through 12/22/2014     Left parietal calvarium 2400 cGy in 8 sessions   . Breast cancer ([place]) dx'd 2005/2011  . Bone metastases ([place]) dx'd 05/2014    Past Surgical History  Procedure Laterality Date  . Breast lumpectomy  2005  . Axillary lymph node dissection  Dec. 2011  . Portacath placement  12/11  . Removal portacath    . Mediastinotomy chamberlain mcneil Left 06/02/2013    Procedure: MEDIASTINOTOMY CHAMBERLAIN MCNEIL;  Surgeon: [name], MD;  Location: [place];  Service: Thoracic;  Laterality: Left;  LEFT ANTERIOR MEDIASTINOTOMY     There were no vitals filed for this visit.      Subjective Assessment - 07/30/15 1022    Subjective Groin feels better--able to walk with not much limp.   Currently in Pain? Yes    Pain Score 5    Pain Location Groin   Pain Orientation Right   Pain Score 5   Pain Location Axilla   Pain Orientation Right;Posterior   Pain Descriptors / Indicators Tender                         [facility] Adult PT Treatment/Exercise - 07/30/15 0001    Manual Therapy   Myofascial Release right axilla with focus on scar tightness   Manual Lymphatic Drainage (MLD) In left sidelying, posterior interaxillary anastomosis and right axillo-inguinal anastomosis; in supine, short neck, left axilla and anterior interaxillary anastomosis, right groin and axillo-inguinal anastomosis, area between right breast scars, directing toward pathways, and right upper arm.  In right sidelying, left periscapular area toward left groin.   Other Manual Therapy soft tissue work at superior and entire aspect of right medial thigh in right sidelying for pain relief and muscle relaxation; gentle myofascial release                  PT Short Term Goals - 02/27/15 1011    PT SHORT TERM GOAL #1   Title pain with walking decreased >/= 25%   Time 3   Period Weeks   Status Achieved   PT SHORT TERM GOAL #2   Title pelvis stays in correct alignment for 3 weeks   Time 3   Period Weeks   Status Achieved           PT Long Term Goals - 07/24/15 1311  PT LONG TERM GOAL #1   Title indpendent with HEP   Time 12   Period Weeks   Status On-going   PT LONG TERM GOAL #2   Title pain with walking decreased >/= 80%   Time 12   Period Weeks   Status On-going   PT LONG TERM GOAL #3   Title go up and down steps with a step over step pattern   Time 6   Period Weeks   Status Deferred   PT LONG TERM GOAL #4   Title being able to last for 5 days without therapy and pain not going above a 8/10 due to decreased muscle soreness   Time 12   Period Weeks   Status On-going           Long Term Clinic Goals - 07/27/15 1230    CC Long Term Goal  #2   Title Pt. will report swelling is  adequately managed to enable ADL function at a consistent level.   Status On-going   CC Long Term Goal  #4   Title Pain/discomfort at right axilla area will be controlled at 6/10 or less.   Status On-going            Plan - 07/30/15 1314    Clinical Impression Statement Pt. doing a little better today; having less groin pain and feeling optimistic about that.   Rehab Potential Good   Clinical Impairments Affecting Rehab Potential active cancer   PT Frequency 2x / week   PT Duration 12 weeks   PT Treatment/Interventions Manual techniques;Manual lymph drainage   PT Next Visit Plan Remeasure chest circumference; soft tissue work, manual lymph drainage   Consulted and Agree with Plan of Care Patient      Patient will benefit from skilled therapeutic intervention in order to improve the following deficits and impairments:  Pain, Increased muscle spasms, Difficulty walking, Increased edema  Visit Diagnosis: Lymphedema, not elsewhere classified  Pain in right thigh     Problem List Patient Active Problem List   Diagnosis Date Noted  . 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 Zoster 04/04/2015  . Nausea with vomiting 11/18/2014  . Constipation 11/18/2014  . Left-sided thoracic back pain   . Bone metastases ([place]) 11/16/2014  . Back pain 11/15/2014  . Uncontrolled pain 11/14/2014  . 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 Chest wall pain 03/21/2014  . Abnormal LFTs (liver function tests) 09/12/2013  . Breast cancer of upper-inner quadrant of right female breast ([place]) 08/18/2013  . 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

## 2015-07-31 LAB — CANCER ANTIGEN 27.29

## 2015-08-01 ENCOUNTER — Ambulatory Visit (HOSPITAL_BASED_OUTPATIENT_CLINIC_OR_DEPARTMENT_OTHER): Payer: 59

## 2015-08-01 ENCOUNTER — Encounter: Payer: Self-pay | Admitting: Physical Therapy

## 2015-08-01 ENCOUNTER — Ambulatory Visit: Payer: 59 | Attending: Oncology | Admitting: Physical Therapy

## 2015-08-01 VITALS — BP 140/84 | HR 87 | Temp 97.9°F

## 2015-08-01 DIAGNOSIS — Z5111 Encounter for antineoplastic chemotherapy: Secondary | ICD-10-CM | POA: Diagnosis not present

## 2015-08-01 DIAGNOSIS — C7951 Secondary malignant neoplasm of bone: Secondary | ICD-10-CM

## 2015-08-01 DIAGNOSIS — M62838 Other muscle spasm: Secondary | ICD-10-CM | POA: Insufficient documentation

## 2015-08-01 DIAGNOSIS — C771 Secondary and unspecified malignant neoplasm of intrathoracic lymph nodes: Secondary | ICD-10-CM

## 2015-08-01 DIAGNOSIS — C50411 Malignant neoplasm of upper-outer quadrant of right female breast: Secondary | ICD-10-CM

## 2015-08-01 DIAGNOSIS — M25551 Pain in right hip: Secondary | ICD-10-CM | POA: Diagnosis present

## 2015-08-01 MED ORDER — DENOSUMAB 120 MG/1.7ML ~~LOC~~ SOLN
120.0000 mg | Freq: Once | SUBCUTANEOUS | Status: AC
  Administered 2015-08-01: 120 mg via SUBCUTANEOUS
  Filled 2015-08-01: qty 1.7

## 2015-08-01 MED ORDER — FULVESTRANT 250 MG/5ML IM SOLN
500.0000 mg | Freq: Once | INTRAMUSCULAR | Status: AC
  Administered 2015-08-01: 500 mg via INTRAMUSCULAR
  Filled 2015-08-01: qty 10

## 2015-08-01 NOTE — Therapy (Signed)
[facility] [address], [place], [place], [address] Phone: [phone]   Fax:  [phone]  Physical Therapy Treatment  Patient Details  Name: [name] [name] MRN: [id number] Date of Birth: 1960/10/28 Referring Provider: Dr. Lurline Del  Encounter Date: 08/01/2015      PT End of Session - 08/01/15 1437    Visit Number 14  pelvis   Date for PT Re-Evaluation 09/17/15   Authorization Type April cert for lymphedema therapy done   Authorization - Visit Number 140   Authorization - Number of Visits 65   PT Start Time 1400   PT Stop Time 1438   PT Time Calculation (min) 38 min   Activity Tolerance Patient tolerated treatment well   Behavior During Therapy [name] for tasks assessed/performed      Past Medical History  Diagnosis Date  . Seizures ([place]) 2010    Isolated incident.  Marland Kitchen PONV (postoperative nausea and vomiting)   . Peripheral vascular disease ([place]) 02/2010    blood clot related to porta cath  . S/P radiation therapy 07/17/2014 through 08/02/2014     Left mediastinum, left seventh rib 3250 cGy in 13 sessions   . S/P radiation therapy 12/11/2014 through 12/22/2014     Left parietal calvarium 2400 cGy in 8 sessions   . Breast cancer ([place]) dx'd 2005/2011  . Bone metastases ([place]) dx'd 05/2014    Past Surgical History  Procedure Laterality Date  . Breast lumpectomy  2005  . Axillary lymph node dissection  Dec. 2011  . Portacath placement  12/11  . Removal portacath    . Mediastinotomy chamberlain mcneil Left 06/02/2013    Procedure: MEDIASTINOTOMY CHAMBERLAIN MCNEIL;  Surgeon: [name], MD;  Location: [place];  Service: Thoracic;  Laterality: Left;  LEFT ANTERIOR MEDIASTINOTOMY     There were no vitals filed for this visit.       Subjective Assessment - 08/01/15 1405    Subjective I can walk 2 laps around the store before I limp.  Pain during the day is 4/10 and at night increases.    How long can you walk comfortably? make patient limp   Diagnostic tests x rays of back show no fractures;    Patient Stated Goals walk 20 minutes with minimal limp   Currently in Pain? Yes   Pain Score 4   during the day   Pain Location Groin   Pain Orientation Right   Pain Descriptors / Indicators Burning   Pain Type Chronic pain   Pain Onset More than a month ago   Aggravating Factors  adjusting direction when walking or up on feet   Pain Relieving Factors time helps   Effect of Pain on Daily Activities walk with a limp   Multiple Pain Sites No                         [facility] Adult PT Treatment/Exercise - 08/01/15 0001    Manual Therapy   Manual Therapy Soft tissue mobilization;Internal Pelvic Floor   Soft tissue mobilization right hamstring, righ thip adductor, right gluteal, around right hip in left sidely   Internal Pelvic Floor bil. levator ani, right obturator internist; posterior furchette, right side of urethra                PT Education - 08/01/15 1437    Education provided No          PT Short Term  Goals - 02/27/15 1011    PT SHORT TERM GOAL #1   Title pain with walking decreased >/= 25%   Time 3   Period Weeks   Status Achieved   PT SHORT TERM GOAL #2   Title pelvis stays in correct alignment for 3 weeks   Time 3   Period Weeks   Status Achieved           PT Long Term Goals - 08/01/15 1440    PT LONG TERM GOAL #1   Title indpendent with HEP   Time 12   Period Weeks   Status On-going   PT LONG TERM GOAL #2   Title pain with walking decreased >/= 80%   Time 12   Period Weeks   Status On-going  can walk further without increase in pain   PT LONG TERM GOAL #3   Title go up and down steps with a step over step pattern   Time 6   Period Weeks   Status Deferred    PT LONG TERM GOAL #4   Title being able to last for 5 days without therapy and pain not going above a 8/10 due to decreased muscle soreness   Time 12   Period Weeks   Status On-going           Long Term Clinic Goals - 07/27/15 1230    CC Long Term Goal  #2   Title Pt. will report swelling is adequately managed to enable ADL function at a consistent level.   Status On-going   CC Long Term Goal  #4   Title Pain/discomfort at right axilla area will be controlled at 6/10 or less.   Status On-going            Plan - 08/01/15 1438    Clinical Impression Statement Patients pain level has decreased to 3/10 after treatment.  No swelling noted in the pelvic area during internal soft tissue work.  Patient can walk around the store 2 times before she limps.  Patient feels she can try 1 week without therapy due to improving.    Rehab Potential Good   Clinical Impairments Affecting Rehab Potential active cancer   PT Frequency 2x / week   PT Duration 12 weeks   PT Treatment/Interventions Manual techniques;Manual lymph drainage   PT Next Visit Plan soft tissue work internally   PT Home Exercise Plan progress as needed   Consulted and Agree with Plan of Care Patient   PT Plan Continue manual lymph drainage, soft tissue work.      Patient will benefit from skilled therapeutic intervention in order to improve the following deficits and impairments:  Pain, Increased muscle spasms, Difficulty walking, Increased edema  Visit Diagnosis: Pain in right hip     Problem List Patient Active Problem List   Diagnosis Date Noted  . 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 Zoster 04/04/2015  . Nausea with vomiting 11/18/2014  . Constipation 11/18/2014  . Left-sided thoracic back pain   . Bone metastases ([place]) 11/16/2014  . Back pain 11/15/2014  . Uncontrolled pain 11/14/2014  . 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 Chest wall pain 03/21/2014  . Abnormal LFTs (liver  function tests) 09/12/2013  . Breast cancer of upper-inner quadrant of right female breast ([place]) 08/18/2013  . 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  [place], [place], [place], [address] Phone: [phone]   Fax:  [phone]  Name: [name] [name] MRN: [id number] Date of Birth: 02/17/1961

## 2015-08-03 ENCOUNTER — Ambulatory Visit: Payer: 59 | Admitting: Physical Therapy

## 2015-08-03 DIAGNOSIS — I890 Lymphedema, not elsewhere classified: Secondary | ICD-10-CM

## 2015-08-03 DIAGNOSIS — M79651 Pain in right thigh: Secondary | ICD-10-CM
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
## 2015-08-06 ENCOUNTER — Other Ambulatory Visit (HOSPITAL_BASED_OUTPATIENT_CLINIC_OR_DEPARTMENT_OTHER): Payer: 59

## 2015-08-06 ENCOUNTER — Ambulatory Visit: Payer: 59 | Admitting: Physical Therapy

## 2015-08-06 ENCOUNTER — Ambulatory Visit (HOSPITAL_COMMUNITY)
Admission: RE | Admit: 2015-08-06 | Discharge: 2015-08-06 | Disposition: A | Discharge status: Home / Self Care | Payer: 59 | Source: Ambulatory Visit | Attending: Oncology | Admitting: Oncology

## 2015-08-06 ENCOUNTER — Other Ambulatory Visit: Payer: 59

## 2015-08-06 ENCOUNTER — Ambulatory Visit (HOSPITAL_BASED_OUTPATIENT_CLINIC_OR_DEPARTMENT_OTHER): Payer: Self-pay

## 2015-08-06 DIAGNOSIS — R918 Other nonspecific abnormal finding of lung field: Secondary | ICD-10-CM | POA: Diagnosis not present

## 2015-08-06 DIAGNOSIS — C771 Secondary and unspecified malignant neoplasm of intrathoracic lymph nodes: Secondary | ICD-10-CM

## 2015-08-06 DIAGNOSIS — C787 Secondary malignant neoplasm of liver and intrahepatic bile duct: Secondary | ICD-10-CM | POA: Diagnosis not present

## 2015-08-06 DIAGNOSIS — R590 Localized enlarged lymph nodes: Secondary | ICD-10-CM | POA: Diagnosis not present

## 2015-08-06 DIAGNOSIS — M79651 Pain in right thigh: Secondary | ICD-10-CM

## 2015-08-06 DIAGNOSIS — I890 Lymphedema, not elsewhere classified: Secondary | ICD-10-CM

## 2015-08-06 DIAGNOSIS — C7800 Secondary malignant neoplasm of unspecified lung: Secondary | ICD-10-CM | POA: Diagnosis not present

## 2015-08-06 DIAGNOSIS — J90 Pleural effusion, not elsewhere classified: Secondary | ICD-10-CM

## 2015-08-06 DIAGNOSIS — C7951 Secondary malignant neoplasm of bone: Secondary | ICD-10-CM | POA: Diagnosis present

## 2015-08-06 DIAGNOSIS — C50211 Malignant neoplasm of upper-inner quadrant of right female breast: Secondary | ICD-10-CM

## 2015-08-06 LAB — CBC WITH DIFFERENTIAL/PLATELET
BASO%: 1 % (ref 0.0–2.0)
Basophils Absolute: 0 10*3/uL (ref 0.0–0.1)
EOS%: 2.1 % (ref 0.0–7.0)
Eosinophils Absolute: 0.1 10*3/uL (ref 0.0–0.5)
HCT: 35.4 % (ref 34.8–46.6)
HGB: 12.6 g/dL (ref 11.6–15.9)
LYMPH%: 23 % (ref 14.0–49.7)
MCH: 34.6 pg — ABNORMAL HIGH (ref 25.1–34.0)
MCHC: 35.6 g/dL (ref 31.5–36.0)
MCV: 97.3 fL (ref 79.5–101.0)
MONO#: 0.4 10*3/uL (ref 0.1–0.9)
MONO%: 9 % (ref 0.0–14.0)
NEUT%: 64.9 % (ref 38.4–76.8)
NEUTROS ABS: 2.5 10*3/uL (ref 1.5–6.5)
Platelets: 237 10*3/uL (ref 145–400)
RBC: 3.64 10*6/uL — AB (ref 3.70–5.45)
RDW: 13.6 % (ref 11.2–14.5)
WBC: 3.9 10*3/uL (ref 3.9–10.3)
lymph#: 0.9 10*3/uL (ref 0.9–3.3)
nRBC: 0 % (ref 0–0)

## 2015-08-06 MED ORDER — GADOBENATE DIMEGLUMINE 529 MG/ML IV SOLN
15.0000 mL | Freq: Once | INTRAVENOUS | Status: AC | PRN
  Administered 2015-08-06: 15 mL via INTRAVENOUS

## 2015-08-06 NOTE — Progress Notes (Signed)
Patient here for IV access for MRI.   IV accessed - see note.  Patient left at 1437 to [facility].  Patient using cane.  Refused wheelchair.

## 2015-08-06 NOTE — Therapy (Signed)
[place], [place], [address] Phone: [phone]   Fax:  [phone]  Physical Therapy Treatment  Patient Details  Name: [name] [name] MRN: [id number] Date of Birth: [birth year] Referring Provider: Dr. Lurline Del  Encounter Date: 08/06/2015      PT End of Session - 08/06/15 1418    Visit Number 55  total   Number of Visits 48  lymph   Date for PT Re-Evaluation 09/17/15   Authorization - Visit Number 140   PT Start Time 1022   PT Stop Time 1104   PT Time Calculation (min) 42 min   Activity Tolerance Patient tolerated treatment well   Behavior During Therapy [facility] for tasks assessed/performed      Past Medical History  Diagnosis Date  . Seizures ([place]) 2010    Isolated incident.  Marland Kitchen PONV (postoperative nausea and vomiting)   . Peripheral vascular disease ([place]) 02/2010    blood clot related to porta cath  . S/P radiation therapy 07/17/2014 through 08/02/2014     Left mediastinum, left seventh rib 3250 cGy in 13 sessions   . S/P radiation therapy 12/11/2014 through 12/22/2014     Left parietal calvarium 2400 cGy in 8 sessions   . Breast cancer ([place]) dx'd 2005/2011  . Bone metastases ([place]) dx'd 05/2014    Past Surgical History  Procedure Laterality Date  . Breast lumpectomy  2005  . Axillary lymph node dissection  Dec. 2011  . Portacath placement  12/11  . Removal portacath    . Mediastinotomy chamberlain mcneil Left 06/02/2013    Procedure: MEDIASTINOTOMY CHAMBERLAIN MCNEIL;  Surgeon: [name], MD;  Location: [place];  Service: Thoracic;  Laterality: Left;  LEFT ANTERIOR MEDIASTINOTOMY     There were no vitals filed for this visit.      Subjective Assessment - 08/06/15 1024    Subjective Had to stand a bit yesterday for  the funeral.   Currently in Pain? Yes   Pain Score 5    Pain Location Groin   Pain Orientation Right   Pain Descriptors / Indicators Burning   Aggravating Factors  standing a lot yesterday   Pain Score 6   Pain Location Axilla   Pain Orientation Right   Pain Descriptors / Indicators Other (Comment)  full; started getting full feeling on Saturday instead of Sunday like usual   Aggravating Factors  more swelling   Pain Relieving Factors therapy                         [facility] Adult PT Treatment/Exercise - 08/06/15 0001    Manual Therapy   Myofascial Release right axilla with focus on scar tightness   Manual Lymphatic Drainage (MLD) In left sidelying, posterior interaxillary anastomosis and right axillo-inguinal anastomosis; in supine, short neck, left axilla and anterior interaxillary anastomosis, right groin and axillo-inguinal anastomosis, area between right breast scars, directing toward pathways, and right upper arm.  In right sidelying, left periscapular area toward left groin.   Other Manual Therapy soft tissue work at superior and entire aspect of right medial thigh in right sidelying for pain relief and muscle relaxation; gentle myofascial release                  PT Short Term Goals - 02/27/15 1011    PT SHORT TERM GOAL #1   Title pain with walking decreased >/= 25%   Time 3   Period  Weeks   Status Achieved   PT SHORT TERM GOAL #2   Title pelvis stays in correct alignment for 3 weeks   Time 3   Period Weeks   Status Achieved           PT Long Term Goals - 08/01/15 1440    PT LONG TERM GOAL #1   Title indpendent with HEP   Time 12   Period Weeks   Status On-going   PT LONG TERM GOAL #2   Title pain with walking decreased >/= 80%   Time 12   Period Weeks   Status On-going  can walk further without increase in pain   PT LONG TERM GOAL #3   Title go up and down steps with a step over step pattern   Time 6   Period Weeks   Status  Deferred   PT LONG TERM GOAL #4   Title being able to last for 5 days without therapy and pain not going above a 8/10 due to decreased muscle soreness   Time 12   Period Weeks   Status On-going           Long Term Clinic Goals - 08/03/15 1239    CC Long Term Goal  #2   Title Pt. will report swelling is adequately managed to enable ADL function at a consistent level.   Status On-going   CC Long Term Goal  #4   Title Pain/discomfort at right axilla area will be controlled at 6/10 or less.   Status On-going            Plan - 08/06/15 1419    Clinical Impression Statement Patient came in limping more today, having been on her feet quite a bit for a funeral yesterday.  Continues to benefit from both lymphedema and pain therapy.   Rehab Potential Good   Clinical Impairments Affecting Rehab Potential active cancer   PT Frequency 2x / week   PT Duration 12 weeks   PT Treatment/Interventions Manual techniques;Manual lymph drainage   Consulted and Agree with Plan of Care Patient      Patient will benefit from skilled therapeutic intervention in order to improve the following deficits and impairments:  Pain, Increased muscle spasms, Difficulty walking, Increased edema  Visit Diagnosis: Lymphedema, not elsewhere classified  Pain in right thigh     Problem List Patient Active Problem List   Diagnosis Date Noted  . 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 Zoster 04/04/2015  . Nausea with vomiting 11/18/2014  . Constipation 11/18/2014  . Left-sided thoracic back pain   . Bone metastases ([place]) 11/16/2014  . Back pain 11/15/2014  . Uncontrolled pain 11/14/2014  . 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 Chest wall pain 03/21/2014  . Abnormal LFTs (liver function tests) 09/12/2013  . Breast cancer of upper-inner quadrant of right female breast ([place]) 08/18/2013  . 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

## 2015-08-07 ENCOUNTER — Encounter: Payer: 59 | Admitting: Physical Therapy

## 2015-08-07 ENCOUNTER — Other Ambulatory Visit: Payer: Self-pay | Admitting: Oncology

## 2015-08-09 ENCOUNTER — Encounter: Payer: 59 | Admitting: Physical Therapy

## 2015-08-09 ENCOUNTER — Ambulatory Visit (HOSPITAL_BASED_OUTPATIENT_CLINIC_OR_DEPARTMENT_OTHER): Payer: 59 | Admitting: Oncology

## 2015-08-09 DIAGNOSIS — C771 Secondary and unspecified malignant neoplasm of intrathoracic lymph nodes: Secondary | ICD-10-CM

## 2015-08-09 DIAGNOSIS — C50211 Malignant neoplasm of upper-inner quadrant of right female breast: Secondary | ICD-10-CM

## 2015-08-09 DIAGNOSIS — C7951 Secondary malignant neoplasm of bone: Secondary | ICD-10-CM | POA: Diagnosis not present

## 2015-08-09 NOTE — Progress Notes (Signed)
[place]  [phone]) [phone] [phone]) [phone]     ID: [name] OB: 06/29/1960  MR#: [id number]  ZDG#:[id number]  PCP: Pcp Not In System GYN:  [name] SU:  OTHER MD: [name], [name], [name], [name], [name]  CHIEF COMPLAINT: Stage IV breast cancer  CURRENT TREATMENT: Fulvestrant, denosumab, palbociclib  BREAST CANCER HISTORY: From doctor [name]'s intake note 03/20/2004:  "The patient is a very pleasant 55 year old female, without significant past medical history.  Her family history is significant for a sister who at age 79 was diagnosed with invasive ductal carcinoma.  She is a breast cancer survivor at age 65 now.  The patient states that she has never really had a screening mammogram until October 2005, when she felt that it was time for her to start having mammograms done on a yearly basis.  Therefore, on 01/26/04, she underwent a screening mammogram and an abnormality was detected in the upper outer right breast.  She, therefore, underwent spot compression views of both the right and the left breast.  The left breast revealed a well-defined mass in the upper outer left quadrant, present at the 2 o'clock position, measuring 1.8 cm, 6 cm from the nipple.  This, by ultrasound, was felt to be a simple cyst measuring 1.8 cm.  On the right breast, a spiculated mass was noted in the upper outer right quadrant.  The ultrasound revealed a shadowing irregular solid mass at the 10:30 position, 9 cm from the nipple, measuring 1.2 cm in greatest dimension, correlating with the spiculated mass seen on the mammogram.  The right axilla was negative ultrasonically.  Because of this, the patient underwent a needle biopsy of the right breast and the biopsy was positive invasive mammary carcinoma that showed features consistent with a high-grade invasive ductal carcinoma associated with desmoplastic stroma.  No in  situ component was seen and no definite lymphovascular invasion was identified.  On the core biopsy, the tumor measured about 0.8 cm.  Because of this, she was seen by Dr. [name] and the patient was taken to the [facility] on March 15, 2004.  She underwent a right breast lumpectomy with sentinel node biopsy.  The final pathology revealed an invasive ductal carcinoma, measuring 1.7 cm, grade 2 of 3.  Margins were free of tumor.  Atypical lobular hyperplasia was noted.  One sentinel node was removed which was negative for metastatic disease.  The tumor was staged at T1c, N0 MX.  It was estrogen receptor positive, progesterone receptor positive.  HER-2/neu was 2+.  FISH was negative.  All margins were free of tumor.  She is now seen in Medical Oncology for further evaluation and management of this newly diagnosed T1c, node negative, stage I, invasive ductal carcinoma of the right breast."  Her subsequent history is as detailed below  INTERVAL HISTORY: Francys returns today for follow-up of her metastatic breast cancer with liver involvement, accompanied by her husband Jeneen Rinks. She receives monthly fulvestrant and then also met and she also receives palbociclib at 75 mg every other day with the next cycle beginning on May 14. Because she takes it on a Sunday, Tuesday, Thursday, Saturday, Sunday schedule she had some taking 13 tablets per cycle. She has tolerated this quite well in terms of counts. There have been no neutrophil counts below 2.   REVIEW OF SYiSTEMS: Tiffiany does have some side effects from the eye brands including some nausea, increased fatigue, and "chemo  brain" and possibly also some dizziness. All this tends to be a little bit worse on the third week of the cycle. She is terrified that if the bone problems in her pelvis gets worse she will not be able to walk. She wonders what the best way to image that area is. The pain in the pelvis in addition to feeling like pain is now also sometimes  feeling like burning. She is also a little bit anxious because she knows I'm going to be out of town next week and she was to make sure she will have access to my partner Dr. Lindi Adie particularly, since he is aware of her situation and of course is also a breast cancer expert. Aside from these issues a detailed review of systems today was stable.  PAST MEDICAL HISTORY: Past Medical History  Diagnosis Date  . Seizures ([place]) 2010    Isolated incident.  Marland Kitchen PONV (postoperative nausea and vomiting)   . Peripheral vascular disease ([place]) 02/2010    blood clot related to porta cath  . S/P radiation therapy 07/17/2014 through 08/02/2014     Left mediastinum, left seventh rib 3250 cGy in 13 sessions   . S/P radiation therapy 12/11/2014 through 12/22/2014     Left parietal calvarium 2400 cGy in 8 sessions   . Breast cancer ([place]) dx'd 2005/2011  . Bone metastases ([place]) dx'd 05/2014    PAST SURGICAL HISTORY: Past Surgical History  Procedure Laterality Date  . Breast lumpectomy  2005  . Axillary lymph node dissection  Dec. 2011  . Portacath placement  12/11  . Removal portacath    . Mediastinotomy chamberlain mcneil Left 06/02/2013    Procedure: MEDIASTINOTOMY CHAMBERLAIN MCNEIL;  Surgeon: [name], MD;  Location: [facility] OR;  Service: Thoracic;  Laterality: Left;  LEFT ANTERIOR MEDIASTINOTOMY     FAMILY HISTORY Family History  Problem Relation Age of Onset  . COPD Mother   . Breast cancer Sister 64   The patient's father is living, 56 years old as of may 2015 as of may 2015. He lives in [place]. The patient's mother died from complications of COPD at the age of 68. These has 2 brothers, one sister. Her sister developed breast cancer at the age of 32. She is doing well. The patient herself underwent genetic testing at [facility] in 2011 and was found to be BRCA  negative  GYNECOLOGIC HISTORY:  Menarche age 11, she is GX P0. She stopped having periods with her initial chemotherapy in 2006.  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 Her husband [name] is a Medical illustrator in [place]. He has a child from a prior marriage. At home they have 2 rescue dogs, Hobo and [place]. The patient is religious but not a church attender    ADVANCED DIRECTIVES: In place; at the 08/04/2014 visit in particular the patient was very clear, with her husband present, that she would not want any kind of feeding tubes or "other tubes" if her condition deteriorated.   HEALTH MAINTENANCE: Social History  Substance Use Topics  . Smoking status: Never Smoker   . Smokeless tobacco: Never Used  . Alcohol Use: No     Colonoscopy:  PAP:  Bone density: March 2015; mild osteopenia  Lipid panel:  Allergies  Allergen Reactions  . 2nd Skin Quick Heal Other (See Comments)    Other Reaction: Skin peels  . Decadron [Dexamethasone] Other (See Comments)  Patient does not tolerate steroids.   . Dilaudid [Hydromorphone] Nausea And Vomiting  . Enoxaparin Other (See Comments)    unknown  . Fluconazole Swelling    Liver toxicity  . Hydromorphone Hcl Nausea And Vomiting  . Morphine And Related Nausea And Vomiting  . Protonix [Pantoprazole Sodium] Other (See Comments)    Patient reports it caused thrush.  . Tegaderm Ag Mesh [Silver]     Current Outpatient Prescriptions  Medication Sig Dispense Refill  . ALPRAZolam (XANAX) 0.5 MG tablet Take 1 tablet (0.5 mg total) by mouth 2 (two) times daily as needed for anxiety. 30 tablet 0  . B Complex-C (B-COMPLEX WITH VITAMIN C) tablet Take 1 tablet by mouth daily. Reported on 03/27/2015    . calcium carbonate (TUMS - DOSED IN MG ELEMENTAL CALCIUM) 500 MG chewable tablet Chew 1 tablet by mouth as directed.    . cholecalciferol 2000 UNITS tablet Take 1 tablet  (2,000 Units total) by mouth daily.    . Diphenhyd-Hydrocort-Nystatin (FIRST-DUKES MOUTHWASH) SUSP 5-10 ml qid SWISH AND SPIT 093 mL 3  . folic acid (FOLVITE) 1 MG tablet Take 1 tablet (1 mg total) by mouth daily.    . magnesium chloride (SLOW-MAG) 64 MG TBEC SR tablet Take 60 mg by mouth daily.    . Melatonin 3 MG TABS Take 3 mg by mouth at bedtime.    . naproxen sodium (ANAPROX) 220 MG tablet Take 220 mg by mouth 2 (two) times daily with a meal.    . nystatin (MYCOSTATIN) 100000 UNIT/ML suspension Take 5 mLs (500,000 Units total) by mouth 4 (four) times daily. 240 mL 3  . palbociclib (IBRANCE) 75 MG capsule Take 1 capsule (75 mg total) by mouth daily with breakfast. Take whole with food. 21 capsule 6  . saccharomyces boulardii (FLORASTOR) 250 MG capsule Take 250 mg by mouth daily.      No current facility-administered medications for this visit.    OBJECTIVE: Middle-aged white woman Who appears stated age There were no vitals filed for this visit.   There is no weight on file to calculate BMI.   There were no vitals filed for this visit.   Patient refused vitals today 08/09/2015     ECOG FS: 2  Sclerae unicteric, pupils round and equal Oropharynx clear and moist-- no thrush or other lesions No cervical or supraclavicular adenopathy Lungs no rales or rhonchi Heart regular rate and rhythm, I/VI SEM noted Abd soft, nontender, positive bowel sounds MSK no focal spinal tenderness, no upper extremity lymphedema Neuro: nonfocal, well oriented, appropriate affect Breasts: Deferred  LAB RESULTS:   CMP     Component Value Date/Time   NA 137 07/30/2015 1139   NA 140 12/14/2014 0800   K 4.0 07/30/2015 1139   K 3.6 12/14/2014 0800   CL 103 12/14/2014 0800   CL 105 05/06/2012 1333   CO2 28 07/30/2015 1139   CO2 28 12/14/2014 0800   GLUCOSE 81 07/30/2015 1139   GLUCOSE 100* 12/14/2014 0800   GLUCOSE 124* 05/06/2012 1333   BUN 13.8 07/30/2015 1139   BUN 6 12/14/2014 0800    CREATININE 0.9 07/30/2015 1139   CREATININE 0.64 12/14/2014 0800   CALCIUM 9.7 07/30/2015 1139   CALCIUM 9.6 12/14/2014 0800   PROT 7.3 07/30/2015 1139   PROT 5.6* 11/20/2014 0433   ALBUMIN 4.0 07/30/2015 1139   ALBUMIN 2.6* 11/20/2014 0433   AST 23 07/30/2015 1139   AST 28 11/20/2014 0433   ALT 30 07/30/2015 1139  ALT 62* 11/20/2014 0433   ALKPHOS 68 07/30/2015 1139   ALKPHOS 105 11/20/2014 0433   BILITOT 0.37 07/30/2015 1139   BILITOT 0.7 11/20/2014 0433   GFRNONAA >60 12/14/2014 0800   GFRAA >60 12/14/2014 0800    No results found for: SPEP  Lab Results  Component Value Date   WBC 3.9 2015-08-14   NEUTROABS 2.5 08-14-15   HGB 12.6 2015-08-14   HCT 35.4 08-14-15   MCV 97.3 08/14/2015   PLT 237 08/14/15      Chemistry      Component Value Date/Time   NA 137 07/30/2015 1139   NA 140 12/14/2014 0800   K 4.0 07/30/2015 1139   K 3.6 12/14/2014 0800   CL 103 12/14/2014 0800   CL 105 05/06/2012 1333   CO2 28 07/30/2015 1139   CO2 28 12/14/2014 0800   BUN 13.8 07/30/2015 1139   BUN 6 12/14/2014 0800   CREATININE 0.9 07/30/2015 1139   CREATININE 0.64 12/14/2014 0800      Component Value Date/Time   CALCIUM 9.7 07/30/2015 1139   CALCIUM 9.6 12/14/2014 0800   ALKPHOS 68 07/30/2015 1139   ALKPHOS 105 11/20/2014 0433   AST 23 07/30/2015 1139   AST 28 11/20/2014 0433   ALT 30 07/30/2015 1139   ALT 62* 11/20/2014 0433   BILITOT 0.37 07/30/2015 1139   BILITOT 0.7 11/20/2014 0433       CA 27.29 0.0 - 38.6 U/mL 1,773.7 (H)         No components found for: LABCA125  No results for input(s): INR in the last 168 hours.  Urinalysis    Component Value Date/Time   COLORURINE YELLOW 11/17/2014 0143   APPEARANCEUR CLOUDY* 11/17/2014 0143   LABSPEC 1.010 11/17/2014 0143   LABSPEC 1.005 09/12/2013 1542   PHURINE 6.5 11/17/2014 0143   PHURINE 6.0 09/12/2013 1542   GLUCOSEU NEGATIVE 11/17/2014 0143   GLUCOSEU Negative 09/12/2013 1542   HGBUR NEGATIVE  11/17/2014 0143   HGBUR Negative 09/12/2013 1542   BILIRUBINUR NEGATIVE 11/17/2014 0143   BILIRUBINUR Negative 09/12/2013 1542   KETONESUR NEGATIVE 11/17/2014 0143   KETONESUR Negative 09/12/2013 1542   PROTEINUR NEGATIVE 11/17/2014 0143   PROTEINUR Negative 09/12/2013 1542   UROBILINOGEN 0.2 11/17/2014 0143   UROBILINOGEN 0.2 09/12/2013 1542   NITRITE NEGATIVE 11/17/2014 0143   NITRITE Negative 09/12/2013 1542   LEUKOCYTESUR NEGATIVE 11/17/2014 0143   LEUKOCYTESUR Negative 09/12/2013 1542    STUDIES: Mr Liver W Wo Contrast  08-14-2015  CLINICAL DATA:  Subsequent treatment evaluation for breast carcinoma. Bone metastasis. EXAM: MRI ABDOMEN WITHOUT AND WITH CONTRAST TECHNIQUE: Multiplanar multisequence MR imaging of the abdomen was performed both before and after the administration of intravenous contrast. CONTRAST:  29m MULTIHANCE GADOBENATE DIMEGLUMINE 529 MG/ML IV SOLN COMPARISON:  MRI 05/30/2015, PET-CT 03/19/2015 FINDINGS: Lower chest: 11 mm nodule in the inferior lingula. Consolidation in the posterior LEFT lower lobe. No significant change from MRI. Hepatobiliary: Multiple rounded enhancing hepatic metastasis again demonstrated. Individual lesions are increased in the interval. For example lesion in the central RIGHT hepatic lobe measuring 18 mm on image 44, series 1201 increased from 13 mm. Larger lesion in the more lateral RIGHT hepatic lobe measures 37 x 26 mm increased from 3.1 x 2.3 mm. Lesion in dome of the liver measures 16 mm (image 22, series 1201) increased from 9 mm. No clear new lesions are identified. Gallbladder is normal. Pancreas: No pancreatic lesion identified.  No duct dilatation. Spleen: Normal spleen. Adrenals/urinary  tract: Adrenal glands and kidneys are normal. Stomach/Bowel: Stomach and limited of the small bowel is unremarkable Vascular/Lymphatic: Abdominal aorta is normal caliber. Lymph node LEFT of the aorta measuring 11 mm short axis is similar 13 mm on prior. No  new adenopathy. Musculoskeletal: Enhancing sclerotic lesion transverse process of L1 and the vertebral body at T 11. The T11 lesion measures 9 mm compared to 8 mm. IMPRESSION: 1. Interval mild to moderate progression of hepatic metastasis. No new lesions identified. 2. Stable pulmonary metastasis at LEFT lung base. LEFT lower lobe consolidation. 3. Stable retroperitoneal lymphadenopathy. 4. Stable skeletal metastasis. Electronically Signed   By: Suzy Bouchard M.D.   On: 08/06/2015 17:05   Dg Pelvis Comp Min 3v  07/16/2015  CLINICAL DATA:  Follow-up pelvic fracture, bone lesions EXAM: [facility] PELVIS - 3+ VIEW COMPARISON:  05/15/2015 FINDINGS: Radius Lucy again noted within the right inferior pubic ramus as seen on prior plain films and MRI compatible with focal bone lesions/metastases. The pathologic fracture seen by MRI is not well visualized by plain films. Overall appearance is stable since prior plain film. IMPRESSION: Radiolucencies in the right inferior pubic ramus again noted, unchanged. Unable to visualize the previously seen pathologic fracture by MRI. Electronically Signed   By: Rolm Baptise M.D.   On: 07/16/2015 12:23    ASSESSMENT: 55 y.o. [place] woman with stage IV breast cancer, history as follows  (1)  S/p Right lumpectomy and sentinel lymph node sampling 03/15/2004 for a pT1c pN0. Stage IA invasive ductal carcinoma, grade 2, estrogen receptor 95% positive, progesterone receptor 65% positive, HER-2 not amplified; additional surgery 04/25/2004 for seroma or clearance showed no residual tumor  (2) adjuvant chemotherapy with cyclophosphamide and doxorubicin every 21 days x4 completed 07/19/2004  (3) adjuvant radiation given under Dr. Donella Stade in [place] completed July 2006  (4) the patient opted against adjuvant antiestrogen therapy  (5) genetics testing showed no BRCA mutations  (6) biopsy of a palpable right axillary mass 10/24/2009 showed invasive ductal carcinoma, grade 3,  estrogen receptor 100% positive, progesterone receptor 2% positive (alert score 5) HER-2 negative; no evidence of systemic disease on PET scanning  (7) completed 3 of 4 planned cycles of docetaxel and cyclophosphamide September 2011, fourth cycle omitted because of marked elevations in liver function tests  (8) an right axillary lymph node dissection 03/06/2010 showed 3/8 lymph nodes removed to be involved by tumor, with extracapsular extension.  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, TTS-1 negative), estrogen receptor 80% positive, progesterone receptor 2% positive, HER-2 not amplified  (12) letrozole started March 2015-- tolerated with significant side effects, discontinued at the end of May 2015  (13) PET scan 08/16/2013 shows extensive left pleural metastatic disease and a large left pleural effusion that shifts cardiac and mediastinal structures to the right; adenopathy (celiac trunk, periadrenal, periaortic); and a left medial clavicular lesion; Status post left thoracentesis 08/16/2013 positive for adenocarcinoma, estrogen receptor positive, progesterone receptor negative.  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, a complex rearrangement in PIK3R2,  and amplification ofPIK3C2B]],  amplification of MCL1 and MDM4, anda MAP2K4 R287H mutation; everolimus was suggested as an available targeted agent  (18) exemestane started 03/31/2014, discontinued 10/31/2014 with evidence of progression  (a) everolimus added 04/03/2014 but not tolerated (cytopenias, elevated LFTs) even at minimal doses; stopped 04/17/2014  (19) fulvestrant started 12/20/2014  (a) palbociclib added at very low dose 04/03/2015 (starting dose 75 mg weekly)  (20) liver biopsy 03/19/2025 confirms metastatic carcinoma, still estrogen receptor positive at 100%, progesterone receptor negative, HER-2 equivocal with a signals ratio 1.41, number per cell 4.50.   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 treated with Lovenox for 5-6 months  (c) right upper extremity lymphedema--receiving physical therapy  (d) hepatic steatosis with chronically elevated LFTs as well as unusual hepatic sensitivity to chemotherapy  (e) osteopenia with the lowest T score -1.6 on bone density scan 06/20/2013  (f) radiation oncology (Dr Valere Dross) has reviewed prior radiation records in case there is further mediastinal involvement with dysphagia etc in which case palliative XRT could be considered  (a) radiation to left mediastinum/ left 7th rib 3250 cGy in 13 sessions04/18/2016 through 08/02/2014  (b) radiation to T11 area: 22 Gy in 7 sessions, last dose 11/27/2014  (c) radiation left parietal scalp region to be completed 12/22/2014  (d) radiation to sacral area completed 04/09/2015  (g) chest wall and perineal pain--improved post radiation treatments  (a) discussed celebrex/ carafate but holding off for now  (h) zoster diagnosed 04/04/2015-- on valacyclovir   PLAN: Yarixa a gym and I spent a  little over an hour today going over her situation. We went over the MRI images in detail. They show some growth and they also show some central necrosis of the lesions. Most likely that is due to central hypoxia. We also reviewed the CAT 729 which is bouncing around but not showing a clear upward trend.  My feeling about all this is that there has been some tumor growth but that we have not maximized her current treatment. She is only receiving the palbociclib every other day. Her ANC has been fine throughout. I think we have the opportunity of intensifying her treatment by increasing the palbociclib.  We discussed this at length. What we are going to do with this cycle, which begins on 08/12/2015, is to take an extra pill on Wednesdays. This is going to bring the total of her pills per cycle 216. If she does well with that then for the next cycle, which would begin June 11, she will take 1 tablet daily. I will be ER new paradigm unless her counts remain perfect in which case we could consider going to 100 mg of.  In any case she will need a PET scan and we will obtain that on June 9, which will be our new baseline. He will also confirm the areas of central necrosis in the liver lesions and there was a better idea of what is going on in the pelvis.  As far as her pain is concerned I suggested we could try Celebrex. She is very concerned about the possibility of "burning her esophagus", so we could couple that with Carafate. At this point  however she does not want to try any new medications.  We discussed travel issues and I see no problem with her taking 4-6 hour plane ride's if she wishes. She is concern about edema and my suggestion there is that she will omit all salt for 2 days prior to a plane trip. That should control the problem.  The pain in her pelvis currently also has a burning component. We usually use gabapentin for that. Again at this point she is reluctant to try any pain medicine at all  but it is something to keep in mind.  Dr. Yisroel Ramming will be reviewing the MRI X week. She is going to see me again on the following week to make a final determination and 2 make sure she is tolerating the new breast dose well.    Chauncey Cruel, MD   08/09/2015 5:46 PM

## 2015-08-10 ENCOUNTER — Other Ambulatory Visit: Payer: Self-pay | Admitting: Oncology

## 2015-08-10 ENCOUNTER — Ambulatory Visit: Payer: 59 | Admitting: Physical Therapy

## 2015-08-10 DIAGNOSIS — I890 Lymphedema, not elsewhere classified: Secondary | ICD-10-CM

## 2015-08-10 DIAGNOSIS — M79651 Pain in right thigh: Secondary | ICD-10-CM

## 2015-08-10 NOTE — Therapy (Signed)
[place], [place], [address] Phone: [phone]   Fax:  [phone]  Physical Therapy Treatment  Patient Details  Name: [name] [name] MRN: [id number] Date of Birth: Apr 13, 1960 Referring Provider: Dr. Lurline Del  Encounter Date: 08/10/2015      PT End of Session - 08/10/15 1229    Visit Number 23  total   Number of Visits 48  lymph   Date for PT Re-Evaluation 09/17/15   PT Start Time 1022   PT Stop Time 1100   PT Time Calculation (min) 38 min   Activity Tolerance Patient tolerated treatment well   Behavior During Therapy [facility] for tasks assessed/performed      Past Medical History  Diagnosis Date  . Seizures ([place]) 2010    Isolated incident.  Marland Kitchen PONV (postoperative nausea and vomiting)   . Peripheral vascular disease ([place]) 02/2010    blood clot related to porta cath  . S/P radiation therapy 07/17/2014 through 08/02/2014     Left mediastinum, left seventh rib 3250 cGy in 13 sessions   . S/P radiation therapy 12/11/2014 through 12/22/2014     Left parietal calvarium 2400 cGy in 8 sessions   . Breast cancer ([place]) dx'd 2005/2011  . Bone metastases ([place]) dx'd 05/2014    Past Surgical History  Procedure Laterality Date  . Breast lumpectomy  2005  . Axillary lymph node dissection  Dec. 2011  . Portacath placement  12/11  . Removal portacath    . Mediastinotomy chamberlain mcneil Left 06/02/2013    Procedure: MEDIASTINOTOMY CHAMBERLAIN MCNEIL;  Surgeon: [name], MD;  Location: [place];  Service: Thoracic;  Laterality: Left;  LEFT ANTERIOR MEDIASTINOTOMY     There were no vitals filed for this visit.      Subjective Assessment - 08/10/15 1024    Subjective No new lesions, but everthing in the liver has doubled in size.  Plan  is to continue the [place] but up the dosage.   Currently in Pain? Yes   Pain Score 5    Pain Location Groin   Pain Orientation Right   Pain Descriptors / Indicators Burning   Pain Frequency Rarely   Aggravating Factors  walking   Pain Relieving Factors Aleve   Pain Score 6   Pain Location Axilla   Pain Orientation Right   Pain Descriptors / Indicators --  pretty hard            [facility] PT Assessment - 08/10/15 0001    Assessment   Medical Diagnosis --                     [facility] Adult PT Treatment/Exercise - 08/10/15 0001    Manual Therapy   Myofascial Release right axilla with focus on scar tightness   Manual Lymphatic Drainage (MLD) In left sidelying, posterior interaxillary anastomosis and right axillo-inguinal anastomosis; in supine, short neck, left axilla and anterior interaxillary anastomosis, right groin and axillo-inguinal anastomosis, area between right breast scars, directing toward pathways, and right upper arm.  In right sidelying, left periscapular area toward left groin.   Other Manual Therapy soft tissue work at superior and entire aspect of right medial thigh in right sidelying for pain relief and muscle relaxation; gentle myofascial release                  PT Short Term Goals - 02/27/15 1011    PT SHORT TERM GOAL #1   Title  pain with walking decreased >/= 25%   Time 3   Period Weeks   Status Achieved   PT SHORT TERM GOAL #2   Title pelvis stays in correct alignment for 3 weeks   Time 3   Period Weeks   Status Achieved           PT Long Term Goals - 08/01/15 1440    PT LONG TERM GOAL #1   Title indpendent with HEP   Time 12   Period Weeks   Status On-going   PT LONG TERM GOAL #2   Title pain with walking decreased >/= 80%   Time 12   Period Weeks   Status On-going  can walk further without increase in pain   PT LONG TERM GOAL #3   Title go up and down steps with a step over step pattern   Time 6   Period Weeks    Status Deferred   PT LONG TERM GOAL #4   Title being able to last for 5 days without therapy and pain not going above a 8/10 due to decreased muscle soreness   Time 12   Period Weeks   Status On-going           Long Term Clinic Goals - 08/10/15 1231    CC Long Term Goal  #2   Title Pt. will report swelling is adequately managed to enable ADL function at a consistent level.   Status On-going   CC Long Term Goal  #4   Title Pain/discomfort at right axilla area will be controlled at 6/10 or less.   Status On-going            Plan - 08/10/15 1229    Clinical Impression Statement Right lateral breast tissue was full and indurated feeling today at start of treatment; softened from treatment.  Pt. has decreased pelvic therapy, but does have ongoing pain that benefits from soft tissue work done here.   Rehab Potential Good   Clinical Impairments Affecting Rehab Potential active cancer   PT Frequency 2x / week   PT Duration 12 weeks   PT Treatment/Interventions Manual techniques;Manual lymph drainage   PT Next Visit Plan manual lymph drainage, soft tissue work   Consulted and Agree with Plan of Care Patient      Patient will benefit from skilled therapeutic intervention in order to improve the following deficits and impairments:  Pain, Increased muscle spasms, Difficulty walking, Increased edema  Visit Diagnosis: Lymphedema, not elsewhere classified  Pain in right thigh     Problem List Patient Active Problem List   Diagnosis Date Noted  . 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 Zoster 04/04/2015  . Nausea with vomiting 11/18/2014  . Constipation 11/18/2014  . Left-sided thoracic back pain   . Bone metastases ([name]) 11/16/2014  . Back pain 11/15/2014  . Uncontrolled pain 11/14/2014  . 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 Chest wall pain 03/21/2014  . Abnormal LFTs (liver function tests) 09/12/2013  . Breast cancer of upper-inner quadrant of  right female breast ([place]) 08/18/2013  . 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

## 2015-08-13 ENCOUNTER — Other Ambulatory Visit (HOSPITAL_BASED_OUTPATIENT_CLINIC_OR_DEPARTMENT_OTHER): Payer: 59

## 2015-08-13 ENCOUNTER — Ambulatory Visit: Payer: 59 | Admitting: Physical Therapy

## 2015-08-13 DIAGNOSIS — I890 Lymphedema, not elsewhere classified: Secondary | ICD-10-CM | POA: Diagnosis not present

## 2015-08-13 DIAGNOSIS — C50211 Malignant neoplasm of upper-inner quadrant of right female breast: Secondary | ICD-10-CM | POA: Diagnosis not present

## 2015-08-13 DIAGNOSIS — M25552 Pain in left hip: Secondary | ICD-10-CM

## 2015-08-13 DIAGNOSIS — J90 Pleural effusion, not elsewhere classified: Secondary | ICD-10-CM

## 2015-08-13 DIAGNOSIS — M79651 Pain in right thigh: Secondary | ICD-10-CM

## 2015-08-13 DIAGNOSIS — C771 Secondary and unspecified malignant neoplasm of intrathoracic lymph nodes: Secondary | ICD-10-CM

## 2015-08-13 LAB — CBC WITH DIFFERENTIAL/PLATELET
BASO%: 1.2 % (ref 0.0–2.0)
BASOS ABS: 0.1 10*3/uL (ref 0.0–0.1)
EOS%: 2.4 % (ref 0.0–7.0)
Eosinophils Absolute: 0.1 10*3/uL (ref 0.0–0.5)
HCT: 36.5 % (ref 34.8–46.6)
HGB: 12.9 g/dL (ref 11.6–15.9)
LYMPH%: 18.4 % (ref 14.0–49.7)
MCH: 34.4 pg — ABNORMAL HIGH (ref 25.1–34.0)
MCHC: 35.3 g/dL (ref 31.5–36.0)
MCV: 97.3 fL (ref 79.5–101.0)
MONO#: 0.6 10*3/uL (ref 0.1–0.9)
MONO%: 14.9 % — AB (ref 0.0–14.0)
NEUT%: 63.1 % (ref 38.4–76.8)
NEUTROS ABS: 2.7 10*3/uL (ref 1.5–6.5)
NRBC: 0 % (ref 0–0)
Platelets: 274 10*3/uL (ref 145–400)
RBC: 3.75 10*6/uL (ref 3.70–5.45)
RDW: 13.6 % (ref 11.2–14.5)
WBC: 4.2 10*3/uL (ref 3.9–10.3)
lymph#: 0.8 10*3/uL — ABNORMAL LOW (ref 0.9–3.3)

## 2015-08-13 NOTE — Therapy (Signed)
[place], [place], [address] Phone: [phone]   Fax:  [phone]  Physical Therapy Treatment  Patient Details  Name: [name] [name] MRN: [id number] Date of Birth: 03/04/1961 Referring Provider: Dr. Lurline Del  Encounter Date: 08/13/2015      PT End of Session - 08/13/15 1321    Visit Number 62  total   Number of Visits 48   Date for PT Re-Evaluation 09/17/15   PT Start Time 1020   PT Stop Time 1102   PT Time Calculation (min) 42 min   Activity Tolerance Patient tolerated treatment well   Behavior During Therapy [facility] for tasks assessed/performed      Past Medical History  Diagnosis Date  . Seizures ([place]) 2010    Isolated incident.  Marland Kitchen PONV (postoperative nausea and vomiting)   . Peripheral vascular disease ([place]) 02/2010    blood clot related to porta cath  . S/P radiation therapy 07/17/2014 through 08/02/2014     Left mediastinum, left seventh rib 3250 cGy in 13 sessions   . S/P radiation therapy 12/11/2014 through 12/22/2014     Left parietal calvarium 2400 cGy in 8 sessions   . Breast cancer ([place]) dx'd 2005/2011  . Bone metastases ([place]) dx'd 05/2014    Past Surgical History  Procedure Laterality Date  . Breast lumpectomy  2005  . Axillary lymph node dissection  Dec. 2011  . Portacath placement  12/11  . Removal portacath    . Mediastinotomy chamberlain mcneil Left 06/02/2013    Procedure: MEDIASTINOTOMY CHAMBERLAIN MCNEIL;  Surgeon: [name], MD;  Location: [place];  Service: Thoracic;  Laterality: Left;  LEFT ANTERIOR MEDIASTINOTOMY     There were no vitals filed for this visit.      Subjective Assessment - 08/13/15 1020    Currently in Pain? Yes   Pain Score 4    Pain Location Leg   Pain Orientation  Right;Upper;Medial;Posterior   Pain Descriptors / Indicators Tightness   Multiple Pain Sites Yes   Pain Score 6   Pain Location Axilla   Pain Orientation Right   Pain Descriptors / Indicators Other (Comment)  hard and full   Pain Score 3  up to 4   Pain Type Acute pain  onset with moving linens on the bed   Pain Location Hip   Pain Orientation Left   Pain Descriptors / Indicators Tender  with mashing it   Pain Onset Sudden                         [facility] Adult PT Treatment/Exercise - 08/13/15 0001    Manual Therapy   Myofascial Release right axilla with focus on scar tightness   Manual Lymphatic Drainage (MLD) In left sidelying, posterior interaxillary anastomosis and right axillo-inguinal anastomosis; in supine, short neck, left axilla and anterior interaxillary anastomosis, right groin and axillo-inguinal anastomosis, area between right breast scars, directing toward pathways, and right upper arm.  In right sidelying, left periscapular area toward left groin.   Other Manual Therapy soft tissue work at superior and entire aspect of right medial thigh in right sidelying for pain relief and muscle relaxation; gentle myofascial release; also soft tissue work to newer painful area at left iliac crest soft tissue                  PT Short Term Goals - 02/27/15 1011    PT [place] #  1   Title pain with walking decreased >/= 25%   Time 3   Period Weeks   Status Achieved   PT SHORT TERM GOAL #2   Title pelvis stays in correct alignment for 3 weeks   Time 3   Period Weeks   Status Achieved           PT Long Term Goals - 08/01/15 1440    PT LONG TERM GOAL #1   Title indpendent with HEP   Time 12   Period Weeks   Status On-going   PT LONG TERM GOAL #2   Title pain with walking decreased >/= 80%   Time 12   Period Weeks   Status On-going  can walk further without increase in pain   PT LONG TERM GOAL #3   Title go up and down steps with a  step over step pattern   Time 6   Period Weeks   Status Deferred   PT LONG TERM GOAL #4   Title being able to last for 5 days without therapy and pain not going above a 8/10 due to decreased muscle soreness   Time 12   Period Weeks   Status On-going           Long Term Clinic Goals - 08/10/15 1231    CC Long Term Goal  #2   Title Pt. will report swelling is adequately managed to enable ADL function at a consistent level.   Status On-going   CC Long Term Goal  #4   Title Pain/discomfort at right axilla area will be controlled at 6/10 or less.   Status On-going            Plan - 08/13/15 1322    Clinical Impression Statement Right lateral breast continues to show fullness and induration between scars at start of treatment.  Patient with new pain at left iliac crest area, just from moving linens to make the bed.  She thinks this may be from cancer metastases in this area, but it could certainly be a soft tissue strain there from the activity that caused it. Patient was walking better today with less limp, and her leg pain was less than usual.   Rehab Potential Good   Clinical Impairments Affecting Rehab Potential active cancer   PT Frequency 2x / week   PT Duration 12 weeks   PT Treatment/Interventions Manual techniques;Manual lymph drainage   PT Next Visit Plan manual lymph drainage, soft tissue work   [employer] and Agree with Plan of Care Patient      Patient will benefit from skilled therapeutic intervention in order to improve the following deficits and impairments:  Pain, Increased muscle spasms, Difficulty walking, Increased edema  Visit Diagnosis: Lymphedema, not elsewhere classified  Pain in right thigh  Pain in left hip     Problem List Patient Active Problem List   Diagnosis Date Noted  . 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 Zoster 04/04/2015  . Nausea with vomiting 11/18/2014  . Constipation 11/18/2014  . Left-sided thoracic back pain   .  Bone metastases ([place]) 11/16/2014  . Back pain 11/15/2014  . Uncontrolled pain 11/14/2014  . 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 Chest wall pain 03/21/2014  . Abnormal LFTs (liver function tests) 09/12/2013  . Breast cancer of upper-inner quadrant of right female breast ([place]) 08/18/2013  . 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

## 2015-08-14 ENCOUNTER — Encounter: Payer: Self-pay | Admitting: Physical Therapy

## 2015-08-14 ENCOUNTER — Ambulatory Visit: Payer: 59 | Admitting: Physical Therapy

## 2015-08-14 DIAGNOSIS — M25551 Pain in right hip: Secondary | ICD-10-CM | POA: Diagnosis not present

## 2015-08-14 DIAGNOSIS — M62838 Other muscle spasm: Secondary | ICD-10-CM

## 2015-08-14 NOTE — Therapy (Signed)
[facility] [address] [place], [place], [address] Phone: [phone]   Fax:  [phone]  Physical Therapy Treatment  Patient Details  Name: [name] [name] MRN: [id number] Date of Birth: [birth year] Referring Provider: Dr. Lurline Del  Encounter Date: 08/14/2015      PT End of Session - 08/14/15 1314    Visit Number 108   Date for PT Re-Evaluation 09/17/15   Authorization Type April cert for lymphedema therapy done   Authorization - Visit Number 140   Authorization - Number of Visits 70   PT Start Time 1230   PT Stop Time 1310   PT Time Calculation (min) 40 min   Activity Tolerance Patient tolerated treatment well   Behavior During Therapy [facility] for tasks assessed/performed      Past Medical History  Diagnosis Date  . Seizures ([place]) 2010    Isolated incident.  Marland Kitchen PONV (postoperative nausea and vomiting)   . Peripheral vascular disease ([place]) 02/2010    blood clot related to porta cath  . S/P radiation therapy 07/17/2014 through 08/02/2014     Left mediastinum, left seventh rib 3250 cGy in 13 sessions   . S/P radiation therapy 12/11/2014 through 12/22/2014     Left parietal calvarium 2400 cGy in 8 sessions   . Breast cancer ([place]) dx'd 2005/2011  . Bone metastases ([place]) dx'd 05/2014    Past Surgical History  Procedure Laterality Date  . Breast lumpectomy  2005  . Axillary lymph node dissection  Dec. 2011  . Portacath placement  12/11  . Removal portacath    . Mediastinotomy chamberlain mcneil Left 06/02/2013    Procedure: MEDIASTINOTOMY CHAMBERLAIN MCNEIL;  Surgeon: [name], MD;  Location: [place];  Service: Thoracic;  Laterality: Left;  LEFT ANTERIOR MEDIASTINOTOMY     There were no vitals filed for this visit.      Subjective  Assessment - 08/14/15 1307    Subjective I had the MRI.  Cancer has grown.  I want to see if I can be placed on hold for several weeks. I have felt better.   How long can you walk comfortably? make patient limp   Diagnostic tests x rays of back show no fractures;    Patient Stated Goals walk 20 minutes with minimal limp   Currently in Pain? Yes   Pain Score 3    Pain Location Groin   Pain Orientation Right   Pain Descriptors / Indicators Tightness   Pain Type Chronic pain   Pain Onset More than a month ago   Pain Frequency Constant   Aggravating Factors  walking   Pain Relieving Factors Aleve   Effect of Pain on Daily Activities walk for a limp                      Pelvic Floor Special Questions - 08/14/15 0001    Pelvic Floor Internal Exam Patient approves physical therapist to perform pelvic floor muscle assessment   Exam Type Vaginal   Strength good squeeze, good lift, able to hold agaisnt strong resistance           [facility] Adult PT Treatment/Exercise - 08/14/15 0001    Manual Therapy   Manual Therapy Soft tissue mobilization   Soft tissue mobilization right hamstring, righ thip adductor, right gluteal, around right hip in left sidely   Internal Pelvic Floor bil. levator ani, right obturator internist; posterior furchette, right side of urethra  PT Education - 08/14/15 1314    Education provided No          PT Short Term Goals - 02/27/15 1011    PT SHORT TERM GOAL #1   Title pain with walking decreased >/= 25%   Time 3   Period Weeks   Status Achieved   PT SHORT TERM GOAL #2   Title pelvis stays in correct alignment for 3 weeks   Time 3   Period Weeks   Status Achieved           PT Long Term Goals - 08/14/15 1317    PT LONG TERM GOAL #1   Title indpendent with HEP   Time 12   Period Weeks   Status On-going   PT LONG TERM GOAL #2   Title pain with walking decreased >/= 80%   Time 12   Period Weeks   Status  On-going   PT LONG TERM GOAL #3   Title go up and down steps with a step over step pattern   Time 6   Status Deferred   PT LONG TERM GOAL #4   Title being able to last for 5 days without therapy and pain not going above a 8/10 due to decreased muscle soreness   Time 12   Period Weeks   Status Achieved           Long Term Clinic Goals - 08/10/15 1231    CC Long Term Goal  #2   Title Pt. will report swelling is adequately managed to enable ADL function at a consistent level.   Status On-going   CC Long Term Goal  #4   Title Pain/discomfort at right axilla area will be controlled at 6/10 or less.   Status On-going            Plan - 08/14/15 1315    Clinical Impression Statement Patient has less pain internally and limp has not decreased.  Pelvic floor strength is 4/5.  Patient reports her liver cancer has grown in size. Patient will be having a PET scan in the future.  Patient will be placed on hold for several weeks  to see how she is doing.    Rehab Potential Good   Clinical Impairments Affecting Rehab Potential active cancer   PT Frequency 2x / week   PT Duration 12 weeks   PT Treatment/Interventions Manual techniques;Manual lymph drainage   PT Next Visit Plan place on hold for several weeks and be reassessed.    PT Home Exercise Plan progress as needed   Consulted and Agree with Plan of Care Patient   PT Plan Continue manual lymph drainage, soft tissue work.      Patient will benefit from skilled therapeutic intervention in order to improve the following deficits and impairments:  Pain, Increased muscle spasms, Difficulty walking, Increased edema  Visit Diagnosis: Other muscle spasm     Problem List Patient Active Problem List   Diagnosis Date Noted  . 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 Zoster 04/04/2015  . Nausea with vomiting 11/18/2014  . Constipation 11/18/2014  . Left-sided thoracic back pain   . Bone metastases ([place]) 11/16/2014  . Back  pain 11/15/2014  . Uncontrolled pain 11/14/2014  . 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 Chest wall pain 03/21/2014  . Abnormal LFTs (liver function tests) 09/12/2013  . Breast cancer of upper-inner quadrant of right female breast ([place]) 08/18/2013  . Secondary malignant neoplasm of mediastinal lymph node (  [place]) 08/18/2013    [name], PT 08/14/2015 1:19 PM   [facility] [address] [place], [place], [address] Phone: [phone]   Fax:  [phone]  Name: IZORA STARMAN MRN: [id number] Date of Birth: 1960-07-08

## 2015-08-16 ENCOUNTER — Encounter: Payer: 59 | Admitting: Physical Therapy

## 2015-08-17 ENCOUNTER — Ambulatory Visit: Payer: 59 | Admitting: Physical Therapy

## 2015-08-17 DIAGNOSIS — I890 Lymphedema, not elsewhere classified: Secondary | ICD-10-CM | POA: Diagnosis not present

## 2015-08-17 DIAGNOSIS — M79651 Pain in right thigh: Secondary | ICD-10-CM

## 2015-08-17 NOTE — Therapy (Signed)
[place], [place], [address] Phone: [phone]   Fax:  [phone]  Physical Therapy Treatment  Patient Details  Name: [name] [name] MRN: [id number] Date of Birth: November 02, 1960 Referring Provider: Dr. Lurline Del  Encounter Date: 08/17/2015      PT End of Session - 08/17/15 1126    Visit Number 75   Number of Visits 48  lymph   Date for PT Re-Evaluation 09/17/15   PT Start Time 1025   PT Stop Time 1105   PT Time Calculation (min) 40 min   Activity Tolerance Patient tolerated treatment well   Behavior During Therapy [facility] for tasks assessed/performed      Past Medical History  Diagnosis Date  . Seizures ([place]) 2010    Isolated incident.  Marland Kitchen PONV (postoperative nausea and vomiting)   . Peripheral vascular disease ([place]) 02/2010    blood clot related to porta cath  . S/P radiation therapy 07/17/2014 through 08/02/2014     Left mediastinum, left seventh rib 3250 cGy in 13 sessions   . S/P radiation therapy 12/11/2014 through 12/22/2014     Left parietal calvarium 2400 cGy in 8 sessions   . Breast cancer ([place]) dx'd 2005/2011  . Bone metastases ([place]) dx'd 05/2014    Past Surgical History  Procedure Laterality Date  . Breast lumpectomy  2005  . Axillary lymph node dissection  Dec. 2011  . Portacath placement  12/11  . Removal portacath    . Mediastinotomy chamberlain mcneil Left 06/02/2013    Procedure: MEDIASTINOTOMY CHAMBERLAIN MCNEIL;  Surgeon: [name], MD;  Location: [place];  Service: Thoracic;  Laterality: Left;  LEFT ANTERIOR MEDIASTINOTOMY     There were no vitals filed for this visit.      Subjective Assessment - 08/17/15 1029    Subjective Really full up in here (upper outer breast).   Currently in Pain? Yes   Pain Score 4    Pain Location Groin   Pain Orientation Right   Pain Score 6   Pain Location Axilla   Pain Orientation Right                         [facility] Adult PT Treatment/Exercise - 08/17/15 0001    Manual Therapy   Myofascial Release right axilla with focus on scar tightness   Manual Lymphatic Drainage (MLD) In left sidelying, posterior interaxillary anastomosis and right axillo-inguinal anastomosis; in supine, short neck, left axilla and anterior interaxillary anastomosis, right groin and axillo-inguinal anastomosis, area between right breast scars, directing toward pathways, and right upper arm.  In right sidelying, left periscapular area toward left groin.   Other Manual Therapy soft tissue work at superior and entire aspect of right medial thigh in right sidelying for pain relief and muscle relaxation; gentle myofascial release                  PT Short Term Goals - 02/27/15 1011    PT SHORT TERM GOAL #1   Title pain with walking decreased >/= 25%   Time 3   Period Weeks   Status Achieved   PT SHORT TERM GOAL #2   Title pelvis stays in correct alignment for 3 weeks   Time 3   Period Weeks   Status Achieved           PT Long Term Goals - 08/14/15 1317    PT LONG TERM GOAL #1  Title indpendent with HEP   Time 12   Period Weeks   Status On-going   PT LONG TERM GOAL #2   Title pain with walking decreased >/= 80%   Time 12   Period Weeks   Status On-going   PT LONG TERM GOAL #3   Title go up and down steps with a step over step pattern   Time 6   Status Deferred   PT LONG TERM GOAL #4   Title being able to last for 5 days without therapy and pain not going above a 8/10 due to decreased muscle soreness   Time 12   Period Weeks   Status Achieved           Long Term Clinic Goals - 08/17/15 1132    CC Long Term Goal  #2   Title Pt. will report swelling is adequately managed to enable ADL function at a consistent level.   Status  On-going   CC Long Term Goal  #4   Title Pain/discomfort at right axilla area will be controlled at 6/10 or less.   Status On-going            Plan - 08/17/15 1128    Clinical Impression Statement Right upper outer breast swelling has been persistent and worse the last few weeks, for unknown reason, but perhaps related to the weather warming up.   Rehab Potential Good   Clinical Impairments Affecting Rehab Potential active cancer   PT Frequency 2x / week   PT Duration 12 weeks   PT Treatment/Interventions Manual techniques;Manual lymph drainage   PT Next Visit Plan manual lymph drainage and soft tissue work for lymphedema and pain   Consulted and Agree with Plan of Care Patient      Patient will benefit from skilled therapeutic intervention in order to improve the following deficits and impairments:  Pain, Increased muscle spasms, Difficulty walking, Increased edema  Visit Diagnosis: Lymphedema, not elsewhere classified  Pain in right thigh     Problem List Patient Active Problem List   Diagnosis Date Noted  . 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 Zoster 04/04/2015  . Nausea with vomiting 11/18/2014  . Constipation 11/18/2014  . Left-sided thoracic back pain   . Bone metastases ([place]) 11/16/2014  . Back pain 11/15/2014  . Uncontrolled pain 11/14/2014  . 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 Chest wall pain 03/21/2014  . Abnormal LFTs (liver function tests) 09/12/2013  . Breast cancer of upper-inner quadrant of right female breast ([place]) 08/18/2013  . 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

## 2015-08-20 ENCOUNTER — Ambulatory Visit (HOSPITAL_BASED_OUTPATIENT_CLINIC_OR_DEPARTMENT_OTHER): Payer: 59 | Admitting: Oncology

## 2015-08-20 ENCOUNTER — Ambulatory Visit: Payer: 59

## 2015-08-20 ENCOUNTER — Other Ambulatory Visit (HOSPITAL_BASED_OUTPATIENT_CLINIC_OR_DEPARTMENT_OTHER): Payer: 59

## 2015-08-20 ENCOUNTER — Telehealth: Payer: Self-pay | Admitting: Oncology

## 2015-08-20 DIAGNOSIS — C7951 Secondary malignant neoplasm of bone: Secondary | ICD-10-CM | POA: Diagnosis not present

## 2015-08-20 DIAGNOSIS — C50211 Malignant neoplasm of upper-inner quadrant of right female breast: Secondary | ICD-10-CM

## 2015-08-20 DIAGNOSIS — I890 Lymphedema, not elsewhere classified: Secondary | ICD-10-CM

## 2015-08-20 DIAGNOSIS — C771 Secondary and unspecified malignant neoplasm of intrathoracic lymph nodes: Secondary | ICD-10-CM

## 2015-08-20 DIAGNOSIS — J90 Pleural effusion, not elsewhere classified: Secondary | ICD-10-CM

## 2015-08-20 DIAGNOSIS — B029 Zoster without complications: Secondary | ICD-10-CM

## 2015-08-20 LAB — CBC WITH DIFFERENTIAL/PLATELET
BASO%: 2.4 % — AB (ref 0.0–2.0)
BASOS ABS: 0.1 10*3/uL (ref 0.0–0.1)
EOS%: 2.9 % (ref 0.0–7.0)
Eosinophils Absolute: 0.1 10*3/uL (ref 0.0–0.5)
HEMATOCRIT: 36.7 % (ref 34.8–46.6)
HEMOGLOBIN: 13 g/dL (ref 11.6–15.9)
LYMPH#: 1 10*3/uL (ref 0.9–3.3)
LYMPH%: 25.3 % (ref 14.0–49.7)
MCH: 34.6 pg — AB (ref 25.1–34.0)
MCHC: 35.4 g/dL (ref 31.5–36.0)
MCV: 97.6 fL (ref 79.5–101.0)
MONO#: 0.3 10*3/uL (ref 0.1–0.9)
MONO%: 8 % (ref 0.0–14.0)
NEUT#: 2.3 10*3/uL (ref 1.5–6.5)
NEUT%: 61.4 % (ref 38.4–76.8)
Platelets: 307 10*3/uL (ref 145–400)
RBC: 3.76 10*6/uL (ref 3.70–5.45)
RDW: 13.5 % (ref 11.2–14.5)
WBC: 3.8 10*3/uL — ABNORMAL LOW (ref 3.9–10.3)
nRBC: 0 % (ref 0–0)

## 2015-08-20 NOTE — Telephone Encounter (Signed)
Added appts...the patient did not want print out she is aware of all appts

## 2015-08-20 NOTE — Progress Notes (Signed)
[place]  [phone] [phone]) [phone]     ID: [name] [name] OB: [birth year]  MR#: [id number]  WTU#:[id number]  PCP: Pcp Not In System GYN:  [name] [name] SU:  OTHER MD: [name] [name], [name] [name], [name] [name], [name] [name], [name] [name]  CHIEF COMPLAINT: Stage IV breast cancer  CURRENT TREATMENT: Fulvestrant, denosumab, palbociclib  BREAST CANCER HISTORY: From doctor [name] [name]'s intake note 03/20/2004:  "The patient is a very pleasant 55 year old female, without significant past medical history.  Her family history is significant for a sister who at age 43 was diagnosed with invasive ductal carcinoma.  She is a breast cancer survivor at age 20 now.  The patient states that she has never really had a screening mammogram until October 2005, when she felt that it was time for her to start having mammograms done on a yearly basis.  Therefore, on 01/26/04, she underwent a screening mammogram and an abnormality was detected in the upper outer right breast.  She, therefore, underwent spot compression views of both the right and the left breast.  The left breast revealed a well-defined mass in the upper outer left quadrant, present at the 2 o'clock position, measuring 1.8 cm, 6 cm from the nipple.  This, by ultrasound, was felt to be a simple cyst measuring 1.8 cm.  On the right breast, a spiculated mass was noted in the upper outer right quadrant.  The ultrasound revealed a shadowing irregular solid mass at the 10:30 position, 9 cm from the nipple, measuring 1.2 cm in greatest dimension, correlating with the spiculated mass seen on the mammogram.  The right axilla was negative ultrasonically.  Because of this, the patient underwent a needle biopsy of the right breast and the biopsy was positive invasive mammary carcinoma that showed features consistent with a high-grade invasive ductal carcinoma associated with desmoplastic stroma.  No in  situ component was seen and no definite lymphovascular invasion was identified.  On the core biopsy, the tumor measured about 0.8 cm.  Because of this, she was seen by Dr. [name] and the patient was taken to the [facility] on March 15, 2004.  She underwent a right breast lumpectomy with sentinel node biopsy.  The final pathology revealed an invasive ductal carcinoma, measuring 1.7 cm, grade 2 of 3.  Margins were free of tumor.  Atypical lobular hyperplasia was noted.  One sentinel node was removed which was negative for metastatic disease.  The tumor was staged at T1c, N0 MX.  It was estrogen receptor positive, progesterone receptor positive.  HER-2/neu was 2+.  FISH was negative.  All margins were free of tumor.  She is now seen in Medical Oncology for further evaluation and management of this newly diagnosed T1c, node negative, stage I, invasive ductal carcinoma of the right breast."  Her subsequent history is as detailed below  INTERVAL HISTORY: [name] [name] returns today for follow-up of her estrogen receptor positive metastatic breast cancer, accompanied by her husband [name] [name]. She started the current cycle of palbociclib 08/12/2015. She normally takes it Sunday, Tuesday, Thursday, Saturday, and Sunday again and repeat. This week she added Wednesday. She felt increased fatigue for example when she tried to walk behind her husband while he was doing some yard work she felt very tired the next day. Despite the fatigue she doesn't get into bed during the day. She also had some nausea, although no vomiting. She also noticed that her bowel movements are "Feist or family and", for example  yesterday she had 3 loose bowel movements. Aside from these issues she has not noted any other new side effects or complications from the palbociclib. She continues on the letrozole without significant change.   REVIEW OF SYiSTEMS: [name] [name] surprisingly walks more easily when she walks fast then when she walks slowly. She does use a  cane most of the time although her balance on a flat surface is generally good. She began to find that going to physical therapy 4 days a week was more than she needed especially as the pain in the behind part of the pelvis has decreased significantly. Accordingly the only physical therapy she is having this week is for the upper extremity lymphedema. The pain in the front of the pelvis it has more of a burning component. At this point she does not want to try Celebrex for this. Aside from these issues a detailed review of systems today was stable  PAST MEDICAL HISTORY: Past Medical History  Diagnosis Date  . Seizures ([place]) 2010    Isolated incident.  Marland Kitchen PONV (postoperative nausea and vomiting)   . Peripheral vascular disease ([place]) 02/2010    blood clot related to porta cath  . S/P radiation therapy 07/17/2014 through 08/02/2014     Left mediastinum, left seventh rib 3250 cGy in 13 sessions   . S/P radiation therapy 12/11/2014 through 12/22/2014     Left parietal calvarium 2400 cGy in 8 sessions   . Breast cancer ([place]) dx'd 2005/2011  . Bone metastases ([place]) dx'd 05/2014    PAST SURGICAL HISTORY: Past Surgical History  Procedure Laterality Date  . Breast lumpectomy  2005  . Axillary lymph node dissection  Dec. 2011  . Portacath placement  12/11  . Removal portacath    . Mediastinotomy chamberlain mcneil Left 06/02/2013    Procedure: MEDIASTINOTOMY CHAMBERLAIN MCNEIL;  Surgeon: [name] Nakayama, MD;  Location: [facility] OR;  Service: Thoracic;  Laterality: Left;  LEFT ANTERIOR MEDIASTINOTOMY     FAMILY HISTORY Family History  Problem Relation Age of Onset  . COPD Mother   . Breast cancer Sister 75   The patient's father is living, 33 years old as of may 2015. He lives in [place]. The patient's mother died from complications of COPD at the  age of 3. These has 2 brothers, one sister. Her sister developed breast cancer at the age of 41. She is doing well. The patient herself underwent genetic testing at [facility] in 2011 and was found to be BRCA negative  GYNECOLOGIC HISTORY:  Menarche age 70, she is GX P0. She stopped having periods with her initial chemotherapy in 2006.  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 Her husband [name] [name] is a Medical illustrator in [place]. He has a child from a prior marriage. At home they have 2 rescue dogs, Hobo and Shickshinny. The patient is religious but not a church attender    ADVANCED DIRECTIVES: In place; at the 08/04/2014 visit in particular the patient was very clear, with her husband present, that she would not want any kind of feeding tubes or "other tubes" if her condition deteriorated.   HEALTH MAINTENANCE: Social History  Substance Use Topics  . Smoking status: Never Smoker   . Smokeless tobacco: Never Used  . Alcohol Use: No     Colonoscopy:  PAP:  Bone density: March 2015; mild osteopenia  Lipid panel:  Allergies  Allergen Reactions  .  2nd Skin Quick Heal Other (See Comments)    Other Reaction: Skin peels  . Decadron [Dexamethasone] Other (See Comments)    Patient does not tolerate steroids.   . Dilaudid [Hydromorphone] Nausea And Vomiting  . Enoxaparin Other (See Comments)    unknown  . Fluconazole Swelling    Liver toxicity  . Hydromorphone Hcl Nausea And Vomiting  . Morphine And Related Nausea And Vomiting  . Protonix [Pantoprazole Sodium] Other (See Comments)    Patient reports it caused thrush.  . Tegaderm Ag Mesh [Silver]     Current Outpatient Prescriptions  Medication Sig Dispense Refill  . ALPRAZolam (XANAX) 0.5 MG tablet Take 1 tablet (0.5 mg total) by mouth 2 (two) times daily as needed for anxiety. 30 tablet 0  . B Complex-C (B-COMPLEX WITH VITAMIN C) tablet Take 1 tablet by mouth daily.  Reported on 03/27/2015    . calcium carbonate (TUMS - DOSED IN MG ELEMENTAL CALCIUM) 500 MG chewable tablet Chew 1 tablet by mouth as directed.    . cholecalciferol 2000 UNITS tablet Take 1 tablet (2,000 Units total) by mouth daily.    . Diphenhyd-Hydrocort-Nystatin (FIRST-DUKES MOUTHWASH) SUSP 5-10 ml qid SWISH AND SPIT 678 mL 3  . folic acid (FOLVITE) 1 MG tablet Take 1 tablet (1 mg total) by mouth daily.    . magnesium chloride (SLOW-MAG) 64 MG TBEC SR tablet Take 60 mg by mouth daily.    . Melatonin 3 MG TABS Take 3 mg by mouth at bedtime.    . naproxen sodium (ANAPROX) 220 MG tablet Take 220 mg by mouth 2 (two) times daily with a meal.    . nystatin (MYCOSTATIN) 100000 UNIT/ML suspension Take 5 mLs (500,000 Units total) by mouth 4 (four) times daily. 240 mL 3  . palbociclib (IBRANCE) 75 MG capsule Take 1 capsule (75 mg total) by mouth daily with breakfast. Take whole with food. 21 capsule 6  . saccharomyces boulardii (FLORASTOR) 250 MG capsule Take 250 mg by mouth daily.      No current facility-administered medications for this visit.    OBJECTIVE: Middle-aged white woman Who appears stated age There were no vitals filed for this visit.   There is no weight on file to calculate BMI.   There were no vitals filed for this visit.   Patient refused vitals today 08/20/2015     ECOG FS: 2  Sclerae unicteric, EOMs intact Oropharynx clear, dentition in good repair No cervical or supraclavicular adenopathy Lungs no rales or rhonchi Heart regular rate and rhythm Abd soft, nontender, positive bowel sounds MSK no focal spinal tenderness; right upper extremity lymphedema as before Neuro: nonfocal, well oriented, appropriate affect Breasts: Deferred  LAB RESULTS:   CMP     Component Value Date/Time   NA 137 07/30/2015 1139   NA 140 12/14/2014 0800   K 4.0 07/30/2015 1139   K 3.6 12/14/2014 0800   CL 103 12/14/2014 0800   CL 105 05/06/2012 1333   CO2 28 07/30/2015 1139   CO2 28  12/14/2014 0800   GLUCOSE 81 07/30/2015 1139   GLUCOSE 100* 12/14/2014 0800   GLUCOSE 124* 05/06/2012 1333   BUN 13.8 07/30/2015 1139   BUN 6 12/14/2014 0800   CREATININE 0.9 07/30/2015 1139   CREATININE 0.64 12/14/2014 0800   CALCIUM 9.7 07/30/2015 1139   CALCIUM 9.6 12/14/2014 0800   PROT 7.3 07/30/2015 1139   PROT 5.6* 11/20/2014 0433   ALBUMIN 4.0 07/30/2015 1139   ALBUMIN 2.6* 11/20/2014 9381  AST 23 07/30/2015 1139   AST 28 11/20/2014 0433   ALT 30 07/30/2015 1139   ALT 62* 11/20/2014 0433   ALKPHOS 68 07/30/2015 1139   ALKPHOS 105 11/20/2014 0433   BILITOT 0.37 07/30/2015 1139   BILITOT 0.7 11/20/2014 0433   GFRNONAA >60 12/14/2014 0800   GFRAA >60 12/14/2014 0800    No results found for: SPEP  Lab Results  Component Value Date   WBC 3.8* 08/20/2015   NEUTROABS 2.3 08/20/2015   HGB 13.0 08/20/2015   HCT 36.7 08/20/2015   MCV 97.6 08/20/2015   PLT 307 08/20/2015      Chemistry      Component Value Date/Time   NA 137 07/30/2015 1139   NA 140 12/14/2014 0800   K 4.0 07/30/2015 1139   K 3.6 12/14/2014 0800   CL 103 12/14/2014 0800   CL 105 05/06/2012 1333   CO2 28 07/30/2015 1139   CO2 28 12/14/2014 0800   BUN 13.8 07/30/2015 1139   BUN 6 12/14/2014 0800   CREATININE 0.9 07/30/2015 1139   CREATININE 0.64 12/14/2014 0800      Component Value Date/Time   CALCIUM 9.7 07/30/2015 1139   CALCIUM 9.6 12/14/2014 0800   ALKPHOS 68 07/30/2015 1139   ALKPHOS 105 11/20/2014 0433   AST 23 07/30/2015 1139   AST 28 11/20/2014 0433   ALT 30 07/30/2015 1139   ALT 62* 11/20/2014 0433   BILITOT 0.37 07/30/2015 1139   BILITOT 0.7 11/20/2014 0433       CA 27.29 0.0 - 38.6 U/mL 1,773.7 (H)         No components found for: LABCA125  No results for input(s): INR in the last 168 hours.  Urinalysis    Component Value Date/Time   COLORURINE YELLOW 11/17/2014 0143   APPEARANCEUR CLOUDY* 11/17/2014 0143   LABSPEC 1.010 11/17/2014 0143   LABSPEC 1.005  09/12/2013 1542   PHURINE 6.5 11/17/2014 0143   PHURINE 6.0 09/12/2013 1542   GLUCOSEU NEGATIVE 11/17/2014 0143   GLUCOSEU Negative 09/12/2013 1542   HGBUR NEGATIVE 11/17/2014 0143   HGBUR Negative 09/12/2013 1542   BILIRUBINUR NEGATIVE 11/17/2014 0143   BILIRUBINUR Negative 09/12/2013 1542   KETONESUR NEGATIVE 11/17/2014 0143   KETONESUR Negative 09/12/2013 1542   PROTEINUR NEGATIVE 11/17/2014 0143   PROTEINUR Negative 09/12/2013 1542   UROBILINOGEN 0.2 11/17/2014 0143   UROBILINOGEN 0.2 09/12/2013 1542   NITRITE NEGATIVE 11/17/2014 0143   NITRITE Negative 09/12/2013 1542   LEUKOCYTESUR NEGATIVE 11/17/2014 0143   LEUKOCYTESUR Negative 09/12/2013 1542    STUDIES: Mr Liver W Wo Contrast  08-19-15  CLINICAL DATA:  Subsequent treatment evaluation for breast carcinoma. Bone metastasis. EXAM: MRI ABDOMEN WITHOUT AND WITH CONTRAST TECHNIQUE: Multiplanar multisequence MR imaging of the abdomen was performed both before and after the administration of intravenous contrast. CONTRAST:  72m MULTIHANCE GADOBENATE DIMEGLUMINE 529 MG/ML IV SOLN COMPARISON:  MRI 05/30/2015, PET-CT 03/19/2015 FINDINGS: Lower chest: 11 mm nodule in the inferior lingula. Consolidation in the posterior LEFT lower lobe. No significant change from MRI. Hepatobiliary: Multiple rounded enhancing hepatic metastasis again demonstrated. Individual lesions are increased in the interval. For example lesion in the central RIGHT hepatic lobe measuring 18 mm on image 44, series 1201 increased from 13 mm. Larger lesion in the more lateral RIGHT hepatic lobe measures 37 x 26 mm increased from 3.1 x 2.3 mm. Lesion in dome of the liver measures 16 mm (image 22, series 1201) increased from 9 mm. No clear new lesions  are identified. Gallbladder is normal. Pancreas: No pancreatic lesion identified.  No duct dilatation. Spleen: Normal spleen. Adrenals/urinary tract: Adrenal glands and kidneys are normal. Stomach/Bowel: Stomach and limited of the  small bowel is unremarkable Vascular/Lymphatic: Abdominal aorta is normal caliber. Lymph node LEFT of the aorta measuring 11 mm short axis is similar 13 mm on prior. No new adenopathy. Musculoskeletal: Enhancing sclerotic lesion transverse process of L1 and the vertebral body at T 11. The T11 lesion measures 9 mm compared to 8 mm. IMPRESSION: 1. Interval mild to moderate progression of hepatic metastasis. No new lesions identified. 2. Stable pulmonary metastasis at LEFT lung base. LEFT lower lobe consolidation. 3. Stable retroperitoneal lymphadenopathy. 4. Stable skeletal metastasis. Electronically Signed   By: Suzy Bouchard M.D.   On: 08/06/2015 17:05    ASSESSMENT: 55 y.o. [place] woman with stage IV breast cancer, history as follows  (1)  S/p Right lumpectomy and sentinel lymph node sampling 03/15/2004 for a pT1c pN0. Stage IA invasive ductal carcinoma, grade 2, estrogen receptor 95% positive, progesterone receptor 65% positive, HER-2 not amplified; additional surgery 04/25/2004 for seroma or clearance showed no residual tumor  (2) adjuvant chemotherapy with cyclophosphamide and doxorubicin every 21 days x4 completed 07/19/2004  (3) adjuvant radiation given under Dr. Donella Stade in [place] completed July 2006  (4) the patient opted against adjuvant antiestrogen therapy  (5) genetics testing showed no BRCA mutations  (6) biopsy of a palpable right axillary mass 10/24/2009 showed invasive ductal carcinoma, grade 3, estrogen receptor 100% positive, progesterone receptor 2% positive (alert score 5) HER-2 negative; no evidence of systemic disease on PET scanning  (7) completed 3 of 4 planned cycles of docetaxel and cyclophosphamide September 2011, fourth cycle omitted because of marked elevations in liver function tests  (8) an right axillary lymph node dissection 03/06/2010 showed 3/8 lymph nodes removed to be involved by tumor, with extracapsular extension.  (9) 45 Gy radiation to the right  axillary and right supraclavicular nodal areas, with capecitabine sensitization, completed March 2012   (10) intolerant of letrozole and exemestane; on tamoxifen with interruptions September 2012 to March 2013, but then continuing on tamoxifen more continuously through March of 2015  (11) biopsy of mediastinal adenopathy 06/02/2013 shows invasive ductal carcinoma (gross cystic disease fluid protein positive, TTS-1 negative), estrogen receptor 80% positive, progesterone receptor 2% positive, HER-2 not amplified  (12) letrozole started March 2015-- tolerated with significant side effects, discontinued at the end of May 2015  (13) PET scan 08/16/2013 shows extensive left pleural metastatic disease and a large left pleural effusion that shifts cardiac and mediastinal structures to the right; adenopathy (celiac trunk, periadrenal, periaortic); and a left medial clavicular lesion; Status post left thoracentesis 08/16/2013 positive for adenocarcinoma, estrogen receptor positive, progesterone receptor negative.  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 treated with Lovenox for 5-6 months  (c) right upper extremity lymphedema--receiving physical therapy  (d) hepatic steatosis with chronically elevated LFTs as well as unusual hepatic sensitivity to chemotherapy  (e) osteopenia with the lowest T score -1.6 on bone density scan 06/20/2013  (f) radiation oncology (Dr Valere Dross) has reviewed prior radiation records in case there is further mediastinal involvement with dysphagia etc in which case palliative XRT could be considered  (a) radiation to left mediastinum/ left 7th rib 3250 cGy in 13 sessions04/18/2016 through 08/02/2014  (b) radiation to T11 area: 22 Gy in 7 sessions, last dose 11/27/2014  (c) radiation left parietal scalp region to be completed 12/22/2014  (d) radiation to sacral area completed 04/09/2015  (g) chest wall and perineal pain--improved post radiation treatments  (a) discussed celebrex/ carafate but holding off for now  (h) zoster diagnosed 04/04/2015-- on valacyclovir   PLAN: [name] [name] is tolerating the increased dose of palbociclib relatively well. I think if we push a little bit she will get used to it and there should not be a significant change in her overall functional status.  Once we complete the cycle she will have a PET scan on June 7, which will serve as the new baseline. She will come here to get access since last time she tried to be axis that the MRI place it was very painful and difficult for her.  She will then see me again on June 9 to discuss those results and also  to discuss how she tolerated this cycle of palbociclib. I'm hoping that for the next cycle we will be able to go to the full 21 days. So far her counts are holding up quite well. Her graft her pain is better although it remains constant in the "front part". This is more of a burning component. The pain in the back component is significantly decreased so she actually is only having lymphedema physical therapy this week. She will resume the physical therapy for the pelvic pain next week as needed.   Although she is having some change in her bowel movement frequency and consistency this is not enough to require intervention at present.  Kyley knows to call for any problems that may develop before her next visit here     Chauncey Cruel, MD   08/20/2015 3:40 PM

## 2015-08-21 ENCOUNTER — Encounter: Payer: 59 | Admitting: Physical Therapy

## 2015-08-21 ENCOUNTER — Ambulatory Visit: Payer: 59 | Admitting: Physical Therapy

## 2015-08-21 DIAGNOSIS — I890 Lymphedema, not elsewhere classified: Secondary | ICD-10-CM | POA: Diagnosis not present

## 2015-08-21 DIAGNOSIS — M79651 Pain in right thigh: Secondary | ICD-10-CM

## 2015-08-21 NOTE — Therapy (Signed)
[place], [place], [address] Phone: [phone]   Fax:  [phone]  Physical Therapy Treatment  Patient Details  Name: [name] [name] MRN: [id number] Date of Birth: 04-08-1960 Referring Provider: Dr. Lurline Del  Encounter Date: 08/21/2015      PT End of Session - 08/21/15 1204    Visit Number 72   Number of Visits 48  lymph   Date for PT Re-Evaluation 09/17/15   PT Start Time 1104   PT Stop Time 1150   PT Time Calculation (min) 46 min   Activity Tolerance Patient tolerated treatment well   Behavior During Therapy [facility] for tasks assessed/performed      Past Medical History  Diagnosis Date  . Seizures ([place]) 2010    Isolated incident.  Marland Kitchen PONV (postoperative nausea and vomiting)   . Peripheral vascular disease ([place]) 02/2010    blood clot related to porta cath  . S/P radiation therapy 07/17/2014 through 08/02/2014     Left mediastinum, left seventh rib 3250 cGy in 13 sessions   . S/P radiation therapy 12/11/2014 through 12/22/2014     Left parietal calvarium 2400 cGy in 8 sessions   . Breast cancer ([place]) dx'd 2005/2011  . Bone metastases ([place]) dx'd 05/2014    Past Surgical History  Procedure Laterality Date  . Breast lumpectomy  2005  . Axillary lymph node dissection  Dec. 2011  . Portacath placement  12/11  . Removal portacath    . Mediastinotomy chamberlain mcneil Left 06/02/2013    Procedure: MEDIASTINOTOMY CHAMBERLAIN MCNEIL;  Surgeon: [name], MD;  Location: [facility];  Service: Thoracic;  Laterality: Left;  LEFT ANTERIOR MEDIASTINOTOMY     There were no vitals filed for this visit.      Subjective Assessment - 08/21/15 1107    Subjective "I'm paying the price for walking in the garden on Sunday."   Currently in  Pain? Yes   Pain Score 4    Pain Location Groin   Pain Orientation Right   Aggravating Factors  walking   Pain Relieving Factors Aleve   Pain Score 6   Pain Location Axilla   Pain Orientation Right   Pain Descriptors / Indicators Other (Comment)  full                         [facility] Adult PT Treatment/Exercise - 08/21/15 0001    Manual Therapy   Myofascial Release right axilla with focus on scar tightness   Manual Lymphatic Drainage (MLD) In left sidelying, posterior interaxillary anastomosis and right axillo-inguinal anastomosis; in supine, short neck, left axilla and anterior interaxillary anastomosis, right groin and axillo-inguinal anastomosis, area between right breast scars, directing toward pathways, and right upper arm.  In right sidelying, left periscapular area toward left groin.   Other Manual Therapy soft tissue work at superior and entire aspect of right medial thigh in right sidelying for pain relief and muscle relaxation; gentle myofascial release                  PT Short Term Goals - 02/27/15 1011    PT SHORT TERM GOAL #1   Title pain with walking decreased >/= 25%   Time 3   Period Weeks   Status Achieved   PT SHORT TERM GOAL #2   Title pelvis stays in correct alignment for 3 weeks   Time 3   Period Weeks   Status Achieved  PT Long Term Goals - 08/14/15 1317    PT LONG TERM GOAL #1   Title indpendent with HEP   Time 12   Period Weeks   Status On-going   PT LONG TERM GOAL #2   Title pain with walking decreased >/= 80%   Time 12   Period Weeks   Status On-going   PT LONG TERM GOAL #3   Title go up and down steps with a step over step pattern   Time 6   Status Deferred   PT LONG TERM GOAL #4   Title being able to last for 5 days without therapy and pain not going above a 8/10 due to decreased muscle soreness   Time 12   Period Weeks   Status Achieved           Long Term Clinic Goals - 08/17/15 1132    CC  Long Term Goal  #2   Title Pt. will report swelling is adequately managed to enable ADL function at a consistent level.   Status On-going   CC Long Term Goal  #4   Title Pain/discomfort at right axilla area will be controlled at 6/10 or less.   Status On-going            Plan - 08/21/15 1207    Clinical Impression Statement Right upper outer breast swelling persists, as does muscular tightness at right upper thigh; these both benefit from therapy.   Rehab Potential Good   Clinical Impairments Affecting Rehab Potential active cancer   PT Frequency 2x / week   PT Duration 12 weeks   PT Treatment/Interventions Manual techniques;Manual lymph drainage   PT Next Visit Plan manual lymph drainage and soft tissue work for lymphedema and pain   Consulted and Agree with Plan of Care Patient      Patient will benefit from skilled therapeutic intervention in order to improve the following deficits and impairments:  Pain, Increased muscle spasms, Difficulty walking, Increased edema  Visit Diagnosis: Lymphedema, not elsewhere classified  Pain in right thigh     Problem List Patient Active Problem List   Diagnosis Date Noted  . 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 Zoster 04/04/2015  . Nausea with vomiting 11/18/2014  . Constipation 11/18/2014  . Left-sided thoracic back pain   . Bone metastases ([place]) 11/16/2014  . Back pain 11/15/2014  . Uncontrolled pain 11/14/2014  . 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 Chest wall pain 03/21/2014  . Abnormal LFTs (liver function tests) 09/12/2013  . Breast cancer of upper-inner quadrant of right female breast ([place]) 08/18/2013  . 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

## 2015-08-23 ENCOUNTER — Encounter: Payer: 59 | Admitting: Physical Therapy

## 2015-08-24 ENCOUNTER — Ambulatory Visit: Payer: 59 | Admitting: Physical Therapy

## 2015-08-24 DIAGNOSIS — I890 Lymphedema, not elsewhere classified: Secondary | ICD-10-CM

## 2015-08-24 NOTE — Therapy (Signed)
[place], [place], [address] Phone: [phone]   Fax:  [phone]  Physical Therapy Treatment  Patient Details  Name: [name] [name] MRN: [id number] Date of Birth: [birth year] Referring Provider: Dr. Lurline Del  Encounter Date: 08/24/2015      PT End of Session - 08/24/15 1229    Visit Number 7   Number of Visits 48  lymph   Date for PT Re-Evaluation 09/17/15   PT Start Time 1104   PT Stop Time 1155   PT Time Calculation (min) 51 min   Activity Tolerance Patient tolerated treatment well   Behavior During Therapy [facility] for tasks assessed/performed      Past Medical History  Diagnosis Date  . Seizures ([place]) 2010    Isolated incident.  Marland Kitchen PONV (postoperative nausea and vomiting)   . Peripheral vascular disease ([place]) 02/2010    blood clot related to porta cath  . S/P radiation therapy 07/17/2014 through 08/02/2014     Left mediastinum, left seventh rib 3250 cGy in 13 sessions   . S/P radiation therapy 12/11/2014 through 12/22/2014     Left parietal calvarium 2400 cGy in 8 sessions   . Breast cancer ([place]) dx'd 2005/2011  . Bone metastases ([place]) dx'd 05/2014    Past Surgical History  Procedure Laterality Date  . Breast lumpectomy  2005  . Axillary lymph node dissection  Dec. 2011  . Portacath placement  12/11  . Removal portacath    . Mediastinotomy chamberlain mcneil Left 06/02/2013    Procedure: MEDIASTINOTOMY CHAMBERLAIN MCNEIL;  Surgeon: [name], MD;  Location: [facility];  Service: Thoracic;  Laterality: Left;  LEFT ANTERIOR MEDIASTINOTOMY     There were no vitals filed for this visit.      Subjective Assessment - 08/24/15 1106    Currently in Pain? Yes   Pain Score 3    Pain Location Groin   Pain Orientation Right    Pain Descriptors / Indicators Tightness;Other (Comment)  pain   Aggravating Factors  worse as the day goes on   Pain Relieving Factors therapy helps for a couple of days   Pain Score 6   Pain Location Axilla   Pain Orientation Right   Pain Descriptors / Indicators Other (Comment)  full                         [facility] Adult PT Treatment/Exercise - 08/24/15 0001    Manual Therapy   Myofascial Release right axilla with focus on scar tightness   Manual Lymphatic Drainage (MLD) In left sidelying, posterior interaxillary anastomosis and right axillo-inguinal anastomosis; in supine, short neck, left axilla and anterior interaxillary anastomosis, right groin and axillo-inguinal anastomosis, area between right breast scars, directing toward pathways, and right upper arm.  In right sidelying, left periscapular area toward left groin.   Other Manual Therapy soft tissue work at superior and entire aspect of right medial thigh in right sidelying for pain relief and muscle relaxation; gentle myofascial release                  PT Short Term Goals - 02/27/15 1011    PT SHORT TERM GOAL #1   Title pain with walking decreased >/= 25%   Time 3   Period Weeks   Status Achieved   PT SHORT TERM GOAL #2   Title pelvis stays in correct alignment for 3 weeks   Time 3  Period Weeks   Status Achieved           PT Long Term Goals - 08/14/15 1317    PT LONG TERM GOAL #1   Title indpendent with HEP   Time 12   Period Weeks   Status On-going   PT LONG TERM GOAL #2   Title pain with walking decreased >/= 80%   Time 12   Period Weeks   Status On-going   PT LONG TERM GOAL #3   Title go up and down steps with a step over step pattern   Time 6   Status Deferred   PT LONG TERM GOAL #4   Title being able to last for 5 days without therapy and pain not going above a 8/10 due to decreased muscle soreness   Time 12   Period Weeks   Status Achieved           Long Term  Clinic Goals - 08/24/15 1232    CC Long Term Goal  #2   Title Pt. will report swelling is adequately managed to enable ADL function at a consistent level.   Status On-going   CC Long Term Goal  #4   Title Pain/discomfort at right axilla area will be controlled at 6/10 or less.   Status On-going            Plan - 08/24/15 1230    Clinical Impression Statement Doing somewhat better with her groin pain and walking; it persists, but is a bit less intense.   Rehab Potential Good   Clinical Impairments Affecting Rehab Potential active cancer   PT Frequency 2x / week   PT Duration 12 weeks   PT Treatment/Interventions Manual techniques;Manual lymph drainage   PT Next Visit Plan manual lymph drainage and soft tissue work for lymphedema and pain   Consulted and Agree with Plan of Care Patient      Patient will benefit from skilled therapeutic intervention in order to improve the following deficits and impairments:  Pain, Increased muscle spasms, Difficulty walking, Increased edema  Visit Diagnosis: Lymphedema, not elsewhere classified     Problem List Patient Active Problem List   Diagnosis Date Noted  . 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 Zoster 04/04/2015  . Nausea with vomiting 11/18/2014  . Constipation 11/18/2014  . Left-sided thoracic back pain   . Bone metastases ([place]) 11/16/2014  . Back pain 11/15/2014  . Uncontrolled pain 11/14/2014  . 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 Chest wall pain 03/21/2014  . Abnormal LFTs (liver function tests) 09/12/2013  . Breast cancer of upper-inner quadrant of right female breast ([place]) 08/18/2013  . 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

## 2015-08-28 ENCOUNTER — Ambulatory Visit: Payer: 59 | Admitting: Physical Therapy

## 2015-08-28 ENCOUNTER — Other Ambulatory Visit (HOSPITAL_BASED_OUTPATIENT_CLINIC_OR_DEPARTMENT_OTHER): Payer: 59

## 2015-08-28 DIAGNOSIS — M79651 Pain in right thigh: Secondary | ICD-10-CM

## 2015-08-28 DIAGNOSIS — C771 Secondary and unspecified malignant neoplasm of intrathoracic lymph nodes: Secondary | ICD-10-CM | POA: Diagnosis not present

## 2015-08-28 DIAGNOSIS — C7951 Secondary malignant neoplasm of bone: Secondary | ICD-10-CM

## 2015-08-28 DIAGNOSIS — C50211 Malignant neoplasm of upper-inner quadrant of right female breast: Secondary | ICD-10-CM | POA: Diagnosis not present

## 2015-08-28 DIAGNOSIS — I890 Lymphedema, not elsewhere classified: Secondary | ICD-10-CM

## 2015-08-28 DIAGNOSIS — J90 Pleural effusion, not elsewhere classified: Secondary | ICD-10-CM

## 2015-08-28 LAB — COMPREHENSIVE METABOLIC PANEL
ALBUMIN: 4.1 g/dL (ref 3.5–5.0)
ALK PHOS: 65 U/L (ref 40–150)
ALT: 31 U/L (ref 0–55)
AST: 23 U/L (ref 5–34)
Anion Gap: 7 mEq/L (ref 3–11)
BUN: 11.6 mg/dL (ref 7.0–26.0)
CO2: 26 mEq/L (ref 22–29)
Calcium: 9.5 mg/dL (ref 8.4–10.4)
Chloride: 101 mEq/L (ref 98–109)
Creatinine: 0.8 mg/dL (ref 0.6–1.1)
EGFR: 83 mL/min/{1.73_m2} — ABNORMAL LOW (ref >90)
GLUCOSE: 91 mg/dL (ref 70–140)
POTASSIUM: 3.9 meq/L (ref 3.5–5.1)
Sodium: 135 mEq/L — ABNORMAL LOW (ref 136–145)
TOTAL PROTEIN: 7.6 g/dL (ref 6.4–8.3)
Total Bilirubin: 0.31 mg/dL (ref 0.20–1.20)

## 2015-08-28 LAB — CBC WITH DIFFERENTIAL/PLATELET
BASO%: 1.2 % (ref 0.0–2.0)
BASOS ABS: 0 10*3/uL (ref 0.0–0.1)
EOS ABS: 0.1 10*3/uL (ref 0.0–0.5)
EOS%: 1.6 % (ref 0.0–7.0)
HCT: 37 % (ref 34.8–46.6)
HGB: 12.7 g/dL (ref 11.6–15.9)
LYMPH%: 18.4 % (ref 14.0–49.7)
MCH: 34.7 pg — ABNORMAL HIGH (ref 25.1–34.0)
MCHC: 34.3 g/dL (ref 31.5–36.0)
MCV: 101.2 fL — AB (ref 79.5–101.0)
MONO#: 0.3 10*3/uL (ref 0.1–0.9)
MONO%: 7.6 % (ref 0.0–14.0)
NEUT#: 2.5 10*3/uL (ref 1.5–6.5)
NEUT%: 71.2 % (ref 38.4–76.8)
PLATELETS: 292 10*3/uL (ref 145–400)
RBC: 3.66 10*6/uL — AB (ref 3.70–5.45)
RDW: 13.6 % (ref 11.2–14.5)
WBC: 3.5 10*3/uL — ABNORMAL LOW (ref 3.9–10.3)
lymph#: 0.6 10*3/uL — ABNORMAL LOW (ref 0.9–3.3)

## 2015-08-28 NOTE — Therapy (Signed)
[place], [place], [address] Phone: [phone]   Fax:  [phone]  Physical Therapy Treatment  Patient Details  Name: [name] [name] MRN: [id number] Date of Birth: 1960/07/11 Referring Provider: Dr. Lurline Del  Encounter Date: 08/28/2015      PT End of Session - 08/28/15 1631    Visit Number 34  41 for lymphedema   Number of Visits 48  lymph   Date for PT Re-Evaluation 09/17/15   PT Start Time 1302   PT Stop Time 1346   PT Time Calculation (min) 44 min   Activity Tolerance Patient tolerated treatment well   Behavior During Therapy [employer] for tasks assessed/performed      Past Medical History  Diagnosis Date  . Seizures ([place]) 2010    Isolated incident.  Marland Kitchen PONV (postoperative nausea and vomiting)   . Peripheral vascular disease ([place]) 02/2010    blood clot related to porta cath  . S/P radiation therapy 07/17/2014 through 08/02/2014     Left mediastinum, left seventh rib 3250 cGy in 13 sessions   . S/P radiation therapy 12/11/2014 through 12/22/2014     Left parietal calvarium 2400 cGy in 8 sessions   . Breast cancer ([place]) dx'd 2005/2011  . Bone metastases ([place]) dx'd 05/2014    Past Surgical History  Procedure Laterality Date  . Breast lumpectomy  2005  . Axillary lymph node dissection  Dec. 2011  . Portacath placement  12/11  . Removal portacath    . Mediastinotomy chamberlain mcneil Left 06/02/2013    Procedure: MEDIASTINOTOMY CHAMBERLAIN MCNEIL;  Surgeon: [name], MD;  Location: [facility];  Service: Thoracic;  Laterality: Left;  LEFT ANTERIOR MEDIASTINOTOMY     There were no vitals filed for this visit.      Subjective Assessment - 08/28/15 1304    Subjective "I ditched the cane.  I'm over it.  I just decided that  the pain is going to be what it is."  In the third week of the Ibrance and then the week off is when I'm most susceptible with my blood counts being low.  If the groin pain goes down, I'm going to try the Flexitouch.   Currently in Pain? Yes   Pain Score 3    Pain Location Groin   Pain Orientation Right   Pain Score 6   Pain Location Axilla   Pain Orientation Right   Pain Descriptors / Indicators Other (Comment)  fullness   Aggravating Factors  more swelling   Pain Relieving Factors therapy                         [facility] Adult PT Treatment/Exercise - 08/28/15 0001    Manual Therapy   Myofascial Release right axilla with focus on scar tightness   Manual Lymphatic Drainage (MLD) In left sidelying, posterior interaxillary anastomosis and right axillo-inguinal anastomosis; in supine, short neck, left axilla and anterior interaxillary anastomosis, right groin and axillo-inguinal anastomosis, area between right breast scars, directing toward pathways, and right upper arm.  In right sidelying, left periscapular area toward left groin.   Other Manual Therapy soft tissue work at superior and entire aspect of right medial thigh in right sidelying for pain relief and muscle relaxation; gentle myofascial release                  PT Short Term Goals - 02/27/15 1011    PT SHORT TERM  GOAL #1   Title pain with walking decreased >/= 25%   Time 3   Period Weeks   Status Achieved   PT SHORT TERM GOAL #2   Title pelvis stays in correct alignment for 3 weeks   Time 3   Period Weeks   Status Achieved           PT Long Term Goals - 08/14/15 1317    PT LONG TERM GOAL #1   Title indpendent with HEP   Time 12   Period Weeks   Status On-going   PT LONG TERM GOAL #2   Title pain with walking decreased >/= 80%   Time 12   Period Weeks   Status On-going   PT LONG TERM GOAL #3   Title go up and down steps with a step over step pattern   Time 6   Status Deferred   PT  LONG TERM GOAL #4   Title being able to last for 5 days without therapy and pain not going above a 8/10 due to decreased muscle soreness   Time 12   Period Weeks   Status Achieved           Long Term Clinic Goals - 08/24/15 1232    CC Long Term Goal  #2   Title Pt. will report swelling is adequately managed to enable ADL function at a consistent level.   Status On-going   CC Long Term Goal  #4   Title Pain/discomfort at right axilla area will be controlled at 6/10 or less.   Status On-going            Plan - 08/28/15 1632    Clinical Impression Statement Comes in today walking better and reporting that she is giving up use of the cane.  She is reporting slightly decreased pain in the groin area.  Continues to have fullness at right upper outer breast.   Rehab Potential Good   Clinical Impairments Affecting Rehab Potential active cancer   PT Frequency 2x / week   PT Duration 12 weeks   PT Treatment/Interventions Manual techniques;Manual lymph drainage   PT Next Visit Plan manual lymph drainage and soft tissue work for lymphedema and pain   Consulted and Agree with Plan of Care Patient      Patient will benefit from skilled therapeutic intervention in order to improve the following deficits and impairments:  Pain, Increased muscle spasms, Difficulty walking, Increased edema  Visit Diagnosis: Lymphedema, not elsewhere classified  Pain in right thigh     Problem List Patient Active Problem List   Diagnosis Date Noted  . 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 Zoster 04/04/2015  . Nausea with vomiting 11/18/2014  . Constipation 11/18/2014  . Left-sided thoracic back pain   . Bone metastases ([place]) 11/16/2014  . Back pain 11/15/2014  . Uncontrolled pain 11/14/2014  . 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 Chest wall pain 03/21/2014  . Abnormal LFTs (liver function tests) 09/12/2013  . Breast cancer of upper-inner quadrant of right female  breast ([place]) 08/18/2013  . 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

## 2015-08-29 ENCOUNTER — Ambulatory Visit (HOSPITAL_BASED_OUTPATIENT_CLINIC_OR_DEPARTMENT_OTHER): Payer: 59

## 2015-08-29 VITALS — BP 132/81 | HR 88 | Temp 98.3°F | Resp 20

## 2015-08-29 DIAGNOSIS — Z5111 Encounter for antineoplastic chemotherapy: Secondary | ICD-10-CM | POA: Diagnosis not present

## 2015-08-29 DIAGNOSIS — C7951 Secondary malignant neoplasm of bone: Secondary | ICD-10-CM | POA: Diagnosis not present

## 2015-08-29 DIAGNOSIS — C50211 Malignant neoplasm of upper-inner quadrant of right female breast: Secondary | ICD-10-CM | POA: Diagnosis not present

## 2015-08-29 DIAGNOSIS — C771 Secondary and unspecified malignant neoplasm of intrathoracic lymph nodes: Secondary | ICD-10-CM

## 2015-08-29 LAB — CANCER ANTIGEN 27.29

## 2015-08-29 MED ORDER — DENOSUMAB 120 MG/1.7ML ~~LOC~~ SOLN
120.0000 mg | Freq: Once | SUBCUTANEOUS | Status: AC
  Administered 2015-08-29: 120 mg via SUBCUTANEOUS
  Filled 2015-08-29: qty 1.7

## 2015-08-29 MED ORDER — FULVESTRANT 250 MG/5ML IM SOLN
500.0000 mg | Freq: Once | INTRAMUSCULAR | Status: AC
  Administered 2015-08-29: 500 mg via INTRAMUSCULAR
  Filled 2015-08-29: qty 10

## 2015-08-29 NOTE — Patient Instructions (Signed)
Denosumab injection What is this medicine? DENOSUMAB (den oh sue mab) slows bone breakdown. Prolia is used to treat osteoporosis in women after menopause and in men. Xgeva is used to prevent bone fractures and other bone problems caused by cancer bone metastases. Xgeva is also used to treat giant cell tumor of the bone. This medicine may be used for other purposes; ask your health care provider or pharmacist if you have questions. What should I tell my health care provider before I take this medicine? They need to know if you have any of these conditions: -dental disease -eczema -infection or history of infections -kidney disease or on dialysis -low blood calcium or vitamin D -malabsorption syndrome -scheduled to have surgery or tooth extraction -taking medicine that contains denosumab -thyroid or parathyroid disease -an unusual reaction to denosumab, other medicines, foods, dyes, or preservatives -pregnant or trying to get pregnant -breast-feeding How should I use this medicine? This medicine is for injection under the skin. It is given by a health care professional in a hospital or clinic setting. If you are getting Prolia, a special MedGuide will be given to you by the pharmacist with each prescription and refill. Be sure to read this information carefully each time. For Prolia, talk to your pediatrician regarding the use of this medicine in children. Special care may be needed. For Xgeva, talk to your pediatrician regarding the use of this medicine in children. While this drug may be prescribed for children as young as 13 years for selected conditions, precautions do apply. Overdosage: If you think you have taken too much of this medicine contact a poison control center or emergency room at once. NOTE: This medicine is only for you. Do not share this medicine with others. What if I miss a dose? It is important not to miss your dose. Call your doctor or health care professional if you are  unable to keep an appointment. What may interact with this medicine? Do not take this medicine with any of the following medications: -other medicines containing denosumab This medicine may also interact with the following medications: -medicines that suppress the immune system -medicines that treat cancer -steroid medicines like prednisone or cortisone This list may not describe all possible interactions. Give your health care provider a list of all the medicines, herbs, non-prescription drugs, or dietary supplements you use. Also tell them if you smoke, drink alcohol, or use illegal drugs. Some items may interact with your medicine. What should I watch for while using this medicine? Visit your doctor or health care professional for regular checks on your progress. Your doctor or health care professional may order blood tests and other tests to see how you are doing. Call your doctor or health care professional if you get a cold or other infection while receiving this medicine. Do not treat yourself. This medicine may decrease your body's ability to fight infection. You should make sure you get enough calcium and vitamin D while you are taking this medicine, unless your doctor tells you not to. Discuss the foods you eat and the vitamins you take with your health care professional. See your dentist regularly. Brush and floss your teeth as directed. Before you have any dental work done, tell your dentist you are receiving this medicine. Do not become pregnant while taking this medicine or for 5 months after stopping it. Women should inform their doctor if they wish to become pregnant or think they might be pregnant. There is a potential for serious side effects   to an unborn child. Talk to your health care professional or pharmacist for more information. What side effects may I notice from receiving this medicine? Side effects that you should report to your doctor or health care professional as soon as  possible: -allergic reactions like skin rash, itching or hives, swelling of the face, lips, or tongue -breathing problems -chest pain -fast, irregular heartbeat -feeling faint or lightheaded, falls -fever, chills, or any other sign of infection -muscle spasms, tightening, or twitches -numbness or tingling -skin blisters or bumps, or is dry, peels, or red -slow healing or unexplained pain in the mouth or jaw -unusual bleeding or bruising Side effects that usually do not require medical attention (Report these to your doctor or health care professional if they continue or are bothersome.): -muscle pain -stomach upset, gas This list may not describe all possible side effects. Call your doctor for medical advice about side effects. You may report side effects to FDA at 1-800-FDA-1088. Where should I keep my medicine? This medicine is only given in a clinic, doctor's office, or other health care setting and will not be stored at home. NOTE: This sheet is a summary. It may not cover all possible information. If you have questions about this medicine, talk to your doctor, pharmacist, or health care provider.    2016, Elsevier/Gold Standard. (2011-09-15 12:37:47) Fulvestrant injection What is this medicine? FULVESTRANT (ful VES trant) blocks the effects of estrogen. It is used to treat breast cancer. This medicine may be used for other purposes; ask your health care provider or pharmacist if you have questions. What should I tell my health care provider before I take this medicine? They need to know if you have any of these conditions: -bleeding problems -liver disease -low levels of platelets in the blood -an unusual or allergic reaction to fulvestrant, other medicines, foods, dyes, or preservatives -pregnant or trying to get pregnant -breast-feeding How should I use this medicine? This medicine is for injection into a muscle. It is usually given by a health care professional in a  hospital or clinic setting. Talk to your pediatrician regarding the use of this medicine in children. Special care may be needed. Overdosage: If you think you have taken too much of this medicine contact a poison control center or emergency room at once. NOTE: This medicine is only for you. Do not share this medicine with others. What if I miss a dose? It is important not to miss your dose. Call your doctor or health care professional if you are unable to keep an appointment. What may interact with this medicine? -medicines that treat or prevent blood clots like warfarin, enoxaparin, and dalteparin This list may not describe all possible interactions. Give your health care provider a list of all the medicines, herbs, non-prescription drugs, or dietary supplements you use. Also tell them if you smoke, drink alcohol, or use illegal drugs. Some items may interact with your medicine. What should I watch for while using this medicine? Your condition will be monitored carefully while you are receiving this medicine. You will need important blood work done while you are taking this medicine. Do not become pregnant while taking this medicine or for at least 1 year after stopping it. Women of child-bearing potential will need to have a negative pregnancy test before starting this medicine. Women should inform their doctor if they wish to become pregnant or think they might be pregnant. There is a potential for serious side effects to an unborn child. Men   should inform their doctors if they wish to father a child. This medicine may lower sperm counts. Talk to your health care professional or pharmacist for more information. Do not breast-feed an infant while taking this medicine or for 1 year after the last dose. What side effects may I notice from receiving this medicine? Side effects that you should report to your doctor or health care professional as soon as possible: -allergic reactions like skin rash,  itching or hives, swelling of the face, lips, or tongue -feeling faint or lightheaded, falls -pain, tingling, numbness, or weakness in the legs -signs and symptoms of infection like fever or chills; cough; flu-like symptoms; sore throat -vaginal bleeding Side effects that usually do not require medical attention (report to your doctor or health care professional if they continue or are bothersome): -aches, pains -constipation -diarrhea -headache -hot flashes -nausea, vomiting -pain at site where injected -stomach pain This list may not describe all possible side effects. Call your doctor for medical advice about side effects. You may report side effects to FDA at 1-800-FDA-1088. Where should I keep my medicine? This drug is given in a hospital or clinic and will not be stored at home. NOTE: This sheet is a summary. It may not cover all possible information. If you have questions about this medicine, talk to your doctor, pharmacist, or health care provider.    2016, Elsevier/Gold Standard. (2014-10-13 11:03:55)  

## 2015-08-31 ENCOUNTER — Ambulatory Visit: Payer: 59 | Attending: Oncology | Admitting: Physical Therapy

## 2015-08-31 DIAGNOSIS — M79651 Pain in right thigh: Secondary | ICD-10-CM | POA: Diagnosis present

## 2015-08-31 DIAGNOSIS — I890 Lymphedema, not elsewhere classified: Secondary | ICD-10-CM | POA: Insufficient documentation

## 2015-08-31 DIAGNOSIS — M62838 Other muscle spasm: Secondary | ICD-10-CM | POA: Diagnosis present

## 2015-08-31 NOTE — Therapy (Signed)
[place], [place], [address] Phone: [phone]   Fax:  [phone]  Physical Therapy Treatment  Patient Details  Name: [name] [name] MRN: [id number] Date of Birth: [birth year] Referring Provider: Dr. Lurline Del  Encounter Date: 08/31/2015      PT End of Session - 08/31/15 1331    Visit Number 50  42 for lymphedema   Number of Visits 59  for lymphedema   Date for PT Re-Evaluation 09/17/15   PT Start Time 1020   PT Stop Time 1105   PT Time Calculation (min) 45 min   Activity Tolerance Patient tolerated treatment well   Behavior During Therapy [facility] for tasks assessed/performed      Past Medical History  Diagnosis Date  . Seizures ([place]) 2010    Isolated incident.  Marland Kitchen PONV (postoperative nausea and vomiting)   . Peripheral vascular disease ([place]) 02/2010    blood clot related to porta cath  . S/P radiation therapy 07/17/2014 through 08/02/2014     Left mediastinum, left seventh rib 3250 cGy in 13 sessions   . S/P radiation therapy 12/11/2014 through 12/22/2014     Left parietal calvarium 2400 cGy in 8 sessions   . Breast cancer ([place]) dx'd 2005/2011  . Bone metastases ([place]) dx'd 05/2014    Past Surgical History  Procedure Laterality Date  . Breast lumpectomy  2005  . Axillary lymph node dissection  Dec. 2011  . Portacath placement  12/11  . Removal portacath    . Mediastinotomy chamberlain mcneil Left 06/02/2013    Procedure: MEDIASTINOTOMY CHAMBERLAIN MCNEIL;  Surgeon: [name], MD;  Location: [facility];  Service: Thoracic;  Laterality: Left;  LEFT ANTERIOR MEDIASTINOTOMY     There were no vitals filed for this visit.      Subjective Assessment - 08/31/15 1022    Subjective Had a really bad day yesterday.  Walked too much  without the cane.   Currently in Pain? Yes   Pain Score 4    Pain Location Groin   Pain Orientation Right   Pain Score 6   Pain Location Axilla   Pain Orientation Right                         [facility] Adult PT Treatment/Exercise - 08/31/15 0001    Manual Therapy   Myofascial Release right axilla with focus on scar tightness   Manual Lymphatic Drainage (MLD) In left sidelying, posterior interaxillary anastomosis and right axillo-inguinal anastomosis; in supine, short neck, left axilla and anterior interaxillary anastomosis, right groin and axillo-inguinal anastomosis, area between right breast scars, directing toward pathways, and right upper arm.  In right sidelying, left periscapular area toward left groin.   Other Manual Therapy soft tissue work at superior and entire aspect of right medial thigh in right sidelying for pain relief and muscle relaxation; gentle myofascial release                  PT Short Term Goals - 02/27/15 1011    PT SHORT TERM GOAL #1   Title pain with walking decreased >/= 25%   Time 3   Period Weeks   Status Achieved   PT SHORT TERM GOAL #2   Title pelvis stays in correct alignment for 3 weeks   Time 3   Period Weeks   Status Achieved           PT Long Term Goals -  08/14/15 1317    PT LONG TERM GOAL #1   Title indpendent with HEP   Time 12   Period Weeks   Status On-going   PT LONG TERM GOAL #2   Title pain with walking decreased >/= 80%   Time 12   Period Weeks   Status On-going   PT LONG TERM GOAL #3   Title go up and down steps with a step over step pattern   Time 6   Status Deferred   PT LONG TERM GOAL #4   Title being able to last for 5 days without therapy and pain not going above a 8/10 due to decreased muscle soreness   Time 12   Period Weeks   Status Achieved           Long Term Clinic Goals - 08/31/15 1334    CC Long Term Goal  #2   Title Pt. will report swelling is adequately managed to enable  ADL function at a consistent level.   Status On-going   CC Long Term Goal  #4   Title Pain/discomfort at right axilla area will be controlled at 6/10 or less.   Baseline She has maintained this level for some weeks now.   Status Partially Met            Plan - 08/31/15 1332    Clinical Impression Statement Thigh pain increased from more time spent on her feet yesterday without the cane, making patient feel discouraged.  She is also tired.  Continues to get good (if partial) pain relief from soft tissue work as well as decreased fullness from manual lymph drainage.   Rehab Potential Good   Clinical Impairments Affecting Rehab Potential active cancer   PT Frequency 2x / week   PT Duration 12 weeks   PT Treatment/Interventions Manual techniques;Manual lymph drainage   PT Next Visit Plan manual lymph drainage and soft tissue work for lymphedema and pain   Consulted and Agree with Plan of Care Patient      Patient will benefit from skilled therapeutic intervention in order to improve the following deficits and impairments:  Pain, Increased muscle spasms, Difficulty walking, Increased edema  Visit Diagnosis: Lymphedema, not elsewhere classified  Pain in right thigh     Problem List Patient Active Problem List   Diagnosis Date Noted  . 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 Zoster 04/04/2015  . Nausea with vomiting 11/18/2014  . Constipation 11/18/2014  . Left-sided thoracic back pain   . Bone metastases ([place]) 11/16/2014  . Back pain 11/15/2014  . Uncontrolled pain 11/14/2014  . 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 Chest wall pain 03/21/2014  . Abnormal LFTs (liver function tests) 09/12/2013  . Breast cancer of upper-inner quadrant of right female breast ([place]) 08/18/2013  . 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

## 2015-09-03 ENCOUNTER — Ambulatory Visit: Payer: 59 | Admitting: Physical Therapy

## 2015-09-03 ENCOUNTER — Other Ambulatory Visit (HOSPITAL_BASED_OUTPATIENT_CLINIC_OR_DEPARTMENT_OTHER): Payer: 59

## 2015-09-03 DIAGNOSIS — C50211 Malignant neoplasm of upper-inner quadrant of right female breast: Secondary | ICD-10-CM

## 2015-09-03 DIAGNOSIS — M79651 Pain in right thigh: Secondary | ICD-10-CM

## 2015-09-03 DIAGNOSIS — J90 Pleural effusion, not elsewhere classified: Secondary | ICD-10-CM

## 2015-09-03 DIAGNOSIS — C771 Secondary and unspecified malignant neoplasm of intrathoracic lymph nodes: Secondary | ICD-10-CM

## 2015-09-03 DIAGNOSIS — I890 Lymphedema, not elsewhere classified: Secondary | ICD-10-CM | POA: Diagnosis not present

## 2015-09-03 LAB — CBC WITH DIFFERENTIAL/PLATELET
BASO%: 1.5 % (ref 0.0–2.0)
BASOS ABS: 0.1 10*3/uL (ref 0.0–0.1)
EOS ABS: 0.1 10*3/uL (ref 0.0–0.5)
EOS%: 2 % (ref 0.0–7.0)
HEMATOCRIT: 35.6 % (ref 34.8–46.6)
HEMOGLOBIN: 12.9 g/dL (ref 11.6–15.9)
LYMPH#: 0.8 10*3/uL — AB (ref 0.9–3.3)
LYMPH%: 21.4 % (ref 14.0–49.7)
MCH: 35.2 pg — AB (ref 25.1–34.0)
MCHC: 36.2 g/dL — ABNORMAL HIGH (ref 31.5–36.0)
MCV: 97.3 fL (ref 79.5–101.0)
MONO#: 0.2 10*3/uL (ref 0.1–0.9)
MONO%: 6.1 % (ref 0.0–14.0)
NEUT#: 2.7 10*3/uL (ref 1.5–6.5)
NEUT%: 69 % (ref 38.4–76.8)
NRBC: 0 % (ref 0–0)
PLATELETS: 227 10*3/uL (ref 145–400)
RBC: 3.66 10*6/uL — AB (ref 3.70–5.45)
RDW: 13.4 % (ref 11.2–14.5)
WBC: 3.9 10*3/uL (ref 3.9–10.3)

## 2015-09-03 NOTE — Therapy (Signed)
[place], [place], [address] Phone: [phone]   Fax:  [phone]  Physical Therapy Treatment  Patient Details  Name: [name] [name] MRN: [id number] Date of Birth: 07-Apr-1960 Referring Provider: Dr. Lurline Del  Encounter Date: 09/03/2015      PT End of Session - 09/03/15 1309    Visit Number 50  43 for lymphedema   Number of Visits 61  for lymphedema   Date for PT Re-Evaluation 09/17/15   PT Start Time 1019   PT Stop Time 1057   PT Time Calculation (min) 38 min   Activity Tolerance Patient tolerated treatment well   Behavior During Therapy [facility] for tasks assessed/performed      Past Medical History  Diagnosis Date  . Seizures ([place]) 2010    Isolated incident.  Marland Kitchen PONV (postoperative nausea and vomiting)   . Peripheral vascular disease ([place]) 02/2010    blood clot related to porta cath  . S/P radiation therapy 07/17/2014 through 08/02/2014     Left mediastinum, left seventh rib 3250 cGy in 13 sessions   . S/P radiation therapy 12/11/2014 through 12/22/2014     Left parietal calvarium 2400 cGy in 8 sessions   . Breast cancer ([place]) dx'd 2005/2011  . Bone metastases ([place]) dx'd 05/2014    Past Surgical History  Procedure Laterality Date  . Breast lumpectomy  2005  . Axillary lymph node dissection  Dec. 2011  . Portacath placement  12/11  . Removal portacath    . Mediastinotomy chamberlain mcneil Left 06/02/2013    Procedure: MEDIASTINOTOMY CHAMBERLAIN MCNEIL;  Surgeon: [name], MD;  Location: [facility];  Service: Thoracic;  Laterality: Left;  LEFT ANTERIOR MEDIASTINOTOMY     There were no vitals filed for this visit.      Subjective Assessment - 09/03/15 1019    Subjective I get lightheaded and dizzy in the third week of  the medication; it happened Saturday.   Currently in Pain? Yes   Pain Score 3    Pain Location Groin   Pain Orientation Right   Aggravating Factors  tripped when flip flop caught yesterday   Pain Score 6   Pain Location Axilla   Pain Orientation Right                         [facility] Adult PT Treatment/Exercise - 09/03/15 0001    Manual Therapy   Myofascial Release right axilla with focus on scar tightness   Manual Lymphatic Drainage (MLD) In left sidelying, posterior interaxillary anastomosis and right axillo-inguinal anastomosis; in supine, short neck, left axilla and anterior interaxillary anastomosis, right groin and axillo-inguinal anastomosis, area between right breast scars, directing toward pathways, and right upper arm.  In right sidelying, left periscapular area toward left groin.   Other Manual Therapy soft tissue work at superior and entire aspect of right medial thigh in right sidelying for pain relief and muscle relaxation; gentle myofascial release                  PT Short Term Goals - 02/27/15 1011    PT SHORT TERM GOAL #1   Title pain with walking decreased >/= 25%   Time 3   Period Weeks   Status Achieved   PT SHORT TERM GOAL #2   Title pelvis stays in correct alignment for 3 weeks   Time 3   Period Weeks   Status Achieved  PT Long Term Goals - 08/14/15 1317    PT LONG TERM GOAL #1   Title indpendent with HEP   Time 12   Period Weeks   Status On-going   PT LONG TERM GOAL #2   Title pain with walking decreased >/= 80%   Time 12   Period Weeks   Status On-going   PT LONG TERM GOAL #3   Title go up and down steps with a step over step pattern   Time 6   Status Deferred   PT LONG TERM GOAL #4   Title being able to last for 5 days without therapy and pain not going above a 8/10 due to decreased muscle soreness   Time 12   Period Weeks   Status Achieved           Long Term Clinic Goals - 08/31/15 1334    CC  Long Term Goal  #2   Title Pt. will report swelling is adequately managed to enable ADL function at a consistent level.   Status On-going   CC Long Term Goal  #4   Title Pain/discomfort at right axilla area will be controlled at 6/10 or less.   Baseline She has maintained this level for some weeks now.   Status Partially Met            Plan - 09/03/15 1310    Clinical Impression Statement Continues to have a buildup of fluid at right upper outer breast, axilla, and extending to upper arm and upper back between therapy appointments which is palpable as fullness and induration, and continues to have decrease in volume as well as softening with therapy to the area.  Generally, her right thigh pain is less than it had been, but with intermittent flareups; that benefits from therapy as well.   Rehab Potential Good   Clinical Impairments Affecting Rehab Potential active cancer   PT Frequency 2x / week   PT Duration 12 weeks   PT Treatment/Interventions Manual techniques;Manual lymph drainage   Consulted and Agree with Plan of Care Patient      Patient will benefit from skilled therapeutic intervention in order to improve the following deficits and impairments:  Pain, Increased muscle spasms, Difficulty walking, Increased edema  Visit Diagnosis: Lymphedema, not elsewhere classified  Pain in right thigh     Problem List Patient Active Problem List   Diagnosis Date Noted  . 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 Zoster 04/04/2015  . Nausea with vomiting 11/18/2014  . Constipation 11/18/2014  . Left-sided thoracic back pain   . Bone metastases ([place]) 11/16/2014  . Back pain 11/15/2014  . Uncontrolled pain 11/14/2014  . 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 Chest wall pain 03/21/2014  . Abnormal LFTs (liver function tests) 09/12/2013  . Breast cancer of upper-inner quadrant of right female breast ([place]) 08/18/2013  . Secondary malignant neoplasm of  mediastinal lymph node ([place]) 08/18/2013    [name] [name] 09/03/2015, 1:13 PM  [place], [place], [address] Phone: [phone]   Fax:  [phone]  Name: [name] [name] MRN: [id number] Date of Birth: Mar 14, 1961    [name], PT 09/03/2015 1:13 PM

## 2015-09-05 ENCOUNTER — Ambulatory Visit: Payer: 59

## 2015-09-05 ENCOUNTER — Ambulatory Visit (HOSPITAL_BASED_OUTPATIENT_CLINIC_OR_DEPARTMENT_OTHER): Payer: 59

## 2015-09-05 ENCOUNTER — Ambulatory Visit (HOSPITAL_COMMUNITY)
Admission: RE | Admit: 2015-09-05 | Discharge: 2015-09-05 | Disposition: A | Discharge status: Home / Self Care | Payer: 59 | Source: Ambulatory Visit | Attending: Oncology | Admitting: Oncology

## 2015-09-05 DIAGNOSIS — C771 Secondary and unspecified malignant neoplasm of intrathoracic lymph nodes: Secondary | ICD-10-CM | POA: Insufficient documentation

## 2015-09-05 DIAGNOSIS — C787 Secondary malignant neoplasm of liver and intrahepatic bile duct: Secondary | ICD-10-CM | POA: Diagnosis not present

## 2015-09-05 DIAGNOSIS — C7951 Secondary malignant neoplasm of bone: Secondary | ICD-10-CM | POA: Insufficient documentation

## 2015-09-05 DIAGNOSIS — C50211 Malignant neoplasm of upper-inner quadrant of right female breast: Secondary | ICD-10-CM

## 2015-09-05 LAB — GLUCOSE, CAPILLARY: Glucose-Capillary: 94 mg/dL (ref 65–99)

## 2015-09-05 MED ORDER — FLUDEOXYGLUCOSE F - 18 (FDG) INJECTION
7.8000 | Freq: Once | INTRAVENOUS | Status: AC | PRN
  Administered 2015-09-05: 7.8 via INTRAVENOUS

## 2015-09-07 ENCOUNTER — Ambulatory Visit (HOSPITAL_BASED_OUTPATIENT_CLINIC_OR_DEPARTMENT_OTHER): Payer: 59 | Admitting: Oncology

## 2015-09-07 ENCOUNTER — Other Ambulatory Visit: Payer: Self-pay | Admitting: Oncology

## 2015-09-07 ENCOUNTER — Ambulatory Visit: Payer: 59 | Admitting: Physical Therapy

## 2015-09-07 DIAGNOSIS — C50211 Malignant neoplasm of upper-inner quadrant of right female breast: Secondary | ICD-10-CM | POA: Diagnosis not present

## 2015-09-07 DIAGNOSIS — I890 Lymphedema, not elsewhere classified: Secondary | ICD-10-CM | POA: Diagnosis not present

## 2015-09-07 DIAGNOSIS — C771 Secondary and unspecified malignant neoplasm of intrathoracic lymph nodes: Secondary | ICD-10-CM | POA: Diagnosis not present

## 2015-09-07 DIAGNOSIS — Z86718 Personal history of other venous thrombosis and embolism: Secondary | ICD-10-CM

## 2015-09-07 DIAGNOSIS — C7951 Secondary malignant neoplasm of bone: Secondary | ICD-10-CM | POA: Diagnosis not present

## 2015-09-07 DIAGNOSIS — J910 Malignant pleural effusion: Secondary | ICD-10-CM

## 2015-09-07 DIAGNOSIS — M79651 Pain in right thigh: Secondary | ICD-10-CM

## 2015-09-07 NOTE — Therapy (Signed)
[place], [place], [address] Phone: [phone]   Fax:  [phone]  Physical Therapy Treatment  Patient Details  Name: [name] [name] MRN: [id number] Date of Birth: 09/28/1960 Referring Provider: Dr. Lurline Del  Encounter Date: 09/07/2015      PT End of Session - 09/07/15 1207    Visit Number 65  44 for lymphedema   Number of Visits 38  for lymphedema   Date for PT Re-Evaluation 09/17/15   PT Start Time 1027   PT Stop Time 1107   PT Time Calculation (min) 40 min   Activity Tolerance Patient tolerated treatment well   Behavior During Therapy [facility] for tasks assessed/performed      Past Medical History  Diagnosis Date  . Seizures ([place]) 2010    Isolated incident.  Marland Kitchen PONV (postoperative nausea and vomiting)   . Peripheral vascular disease ([place]) 02/2010    blood clot related to porta cath  . S/P radiation therapy 07/17/2014 through 08/02/2014     Left mediastinum, left seventh rib 3250 cGy in 13 sessions   . S/P radiation therapy 12/11/2014 through 12/22/2014     Left parietal calvarium 2400 cGy in 8 sessions   . Breast cancer ([place]) dx'd 2005/2011  . Bone metastases ([place]) dx'd 05/2014    Past Surgical History  Procedure Laterality Date  . Breast lumpectomy  2005  . Axillary lymph node dissection  Dec. 2011  . Portacath placement  12/11  . Removal portacath    . Mediastinotomy chamberlain mcneil Left 06/02/2013    Procedure: MEDIASTINOTOMY CHAMBERLAIN MCNEIL;  Surgeon: [name], MD;  Location: [place];  Service: Thoracic;  Laterality: Left;  LEFT ANTERIOR MEDIASTINOTOMY     There were no vitals filed for this visit.      Subjective Assessment - 09/07/15 1027    Subjective Read an article about Keytruda for all kinds of  cancers and all participants responded.   Currently in Pain? Yes   Pain Score 3   staying about the same despite not using the cane   Pain Location Groin   Pain Orientation Right   Pain Score 6   Pain Location Axilla   Pain Orientation Right   Pain Descriptors / Indicators Other (Comment)  full                         [facility] Adult PT Treatment/Exercise - 09/07/15 0001    Manual Therapy   Myofascial Release right axilla with focus on scar tightness   Manual Lymphatic Drainage (MLD) In left sidelying, posterior interaxillary anastomosis and right axillo-inguinal anastomosis; in supine, short neck, left axilla and anterior interaxillary anastomosis, right groin and axillo-inguinal anastomosis, area between right breast scars, directing toward pathways, and right upper arm.  In right sidelying, left periscapular area toward left groin.   Other Manual Therapy soft tissue work at superior and entire aspect of right medial thigh in right sidelying for pain relief and muscle relaxation; gentle myofascial release                  PT Short Term Goals - 02/27/15 1011    PT SHORT TERM GOAL #1   Title pain with walking decreased >/= 25%   Time 3   Period Weeks   Status Achieved   PT SHORT TERM GOAL #2   Title pelvis stays in correct alignment for 3 weeks   Time 3  Period Weeks   Status Achieved           PT Long Term Goals - 08/14/15 1317    PT LONG TERM GOAL #1   Title indpendent with HEP   Time 12   Period Weeks   Status On-going   PT LONG TERM GOAL #2   Title pain with walking decreased >/= 80%   Time 12   Period Weeks   Status On-going   PT LONG TERM GOAL #3   Title go up and down steps with a step over step pattern   Time 6   Status Deferred   PT LONG TERM GOAL #4   Title being able to last for 5 days without therapy and pain not going above a 8/10 due to decreased muscle soreness   Time 12   Period Weeks   Status Achieved            Long Term Clinic Goals - 09/07/15 1209    CC Long Term Goal  #2   Title Pt. will report swelling is adequately managed to enable ADL function at a consistent level.   Status On-going   CC Long Term Goal  #4   Title Pain/discomfort at right axilla area will be controlled at 6/10 or less.   Status On-going            Plan - 09/07/15 1207    Clinical Impression Statement Seems to be doing better with pain level staying low even while she is walking without the cane; right lateral breast swelling is chronic, still worsening between treatments and improving with manual therapies.   Rehab Potential Good   Clinical Impairments Affecting Rehab Potential active cancer   PT Frequency 2x / week   PT Duration 12 weeks   PT Treatment/Interventions Manual techniques;Manual lymph drainage   PT Next Visit Plan manual lymph drainage and soft tissue work for lymphedema and pain      Patient will benefit from skilled therapeutic intervention in order to improve the following deficits and impairments:  Pain, Increased muscle spasms, Difficulty walking, Increased edema  Visit Diagnosis: Lymphedema, not elsewhere classified  Pain in right thigh     Problem List Patient Active Problem List   Diagnosis Date Noted  . 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 Zoster 04/04/2015  . Nausea with vomiting 11/18/2014  . Constipation 11/18/2014  . Left-sided thoracic back pain   . Bone metastases ([place]) 11/16/2014  . Back pain 11/15/2014  . Uncontrolled pain 11/14/2014  . 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 Chest wall pain 03/21/2014  . Abnormal LFTs (liver function tests) 09/12/2013  . Breast cancer of upper-inner quadrant of right female breast ([place]) 08/18/2013  . 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

## 2015-09-07 NOTE — Progress Notes (Addendum)
[name] [name]  [phone] [phone]     ID: [name] [name] OB: 30-May-1960  MR#: [id number]  IZT#:[id number]  PCP: Pcp Not In System GYN:  [name] [name] SU:  OTHER MD: [name] [name], [name] [name], [name] [name], [name] [name], [name] [name]  CHIEF COMPLAINT: Stage IV breast cancer  CURRENT TREATMENT: Fulvestrant, denosumab, palbociclib  BREAST CANCER HISTORY: From doctor [name] [name]'s intake note 03/20/2004:  "The patient is a very pleasant 55 year old female, without significant past medical history.  Her family history is significant for a sister who at age 15 was diagnosed with invasive ductal carcinoma.  She is a breast cancer survivor at age 70 now.  The patient states that she has never really had a screening mammogram until October 2005, when she felt that it was time for her to start having mammograms done on a yearly basis.  Therefore, on 01/26/04, she underwent a screening mammogram and an abnormality was detected in the upper outer right breast.  She, therefore, underwent spot compression views of both the right and the left breast.  The left breast revealed a well-defined mass in the upper outer left quadrant, present at the 2 o'clock position, measuring 1.8 cm, 6 cm from the nipple.  This, by ultrasound, was felt to be a simple cyst measuring 1.8 cm.  On the right breast, a spiculated mass was noted in the upper outer right quadrant.  The ultrasound revealed a shadowing irregular solid mass at the 10:30 position, 9 cm from the nipple, measuring 1.2 cm in greatest dimension, correlating with the spiculated mass seen on the mammogram.  The right axilla was negative ultrasonically.  Because of this, the patient underwent a needle biopsy of the right breast and the biopsy was positive invasive mammary carcinoma that showed features consistent with a high-grade invasive ductal carcinoma associated with desmoplastic stroma.  No in  situ component was seen and no definite lymphovascular invasion was identified.  On the core biopsy, the tumor measured about 0.8 cm.  Because of this, she was seen by [name] [name] and the patient was taken to the [facility] on March 15, 2004.  She underwent a right breast lumpectomy with sentinel node biopsy.  The final pathology revealed an invasive ductal carcinoma, measuring 1.7 cm, grade 2 of 3.  Margins were free of tumor.  Atypical lobular hyperplasia was noted.  One sentinel node was removed which was negative for metastatic disease.  The tumor was staged at T1c, N0 MX.  It was estrogen receptor positive, progesterone receptor positive.  HER-2/neu was 2+.  FISH was negative.  All margins were free of tumor.  She is now seen in Medical Oncology for further evaluation and management of this newly diagnosed T1c, node negative, stage I, invasive ductal carcinoma of the right breast."  Her subsequent history is as detailed below  INTERVAL HISTORY: [name] [name] returns today for follow-up of her metastatic breast cancer, accompanied by her husband [name] [name]. She completed her most recent cycle of palbociclib 4 days ago. She was able to take the medication 16 out of the 21 cycle days. Her counts remained excellent. She is also on fulvestrant and denosumab monthly, with good tolerance  REVIEW OF SYiSTEMS: However [name] [name] reports many side effects from her medications, acknowledging some problems may be due to the cancer itself. She is now walking better and not using a cane, bu still has constant groin pain/discomfort. With the higher palbo dose she had more nausea. She can't  take it at night because she has to take it with food and her best meal is breakfast. Bowel movements became more unpredictable ("feast or famine"). She had a couple of episodes of dizzyness. She had more hot flashes and felt more fatigue.She has no sense of taste. Basically she spends all day "fighting all these problems" and has little left over.  A detain Led review of systems was otherwise stable  PAST MEDICAL HISTORY: Past Medical History  Diagnosis Date  . Seizures ([place]) 2010    Isolated incident.  Marland Kitchen PONV (postoperative nausea and vomiting)   . Peripheral vascular disease ([place]) 02/2010    blood clot related to porta cath  . S/P radiation therapy 07/17/2014 through 08/02/2014     Left mediastinum, left seventh rib 3250 cGy in 13 sessions   . S/P radiation therapy 12/11/2014 through 12/22/2014     Left parietal calvarium 2400 cGy in 8 sessions   . Breast cancer ([place]) dx'd 2005/2011  . Bone metastases ([place]) dx'd 05/2014    PAST SURGICAL HISTORY: Past Surgical History  Procedure Laterality Date  . Breast lumpectomy  2005  . Axillary lymph node dissection  Dec. 2011  . Portacath placement  12/11  . Removal portacath    . Mediastinotomy chamberlain mcneil Left 06/02/2013    Procedure: MEDIASTINOTOMY CHAMBERLAIN MCNEIL;  Surgeon: [name], MD;  Location: [name] OR;  Service: Thoracic;  Laterality: Left;  LEFT ANTERIOR MEDIASTINOTOMY     FAMILY HISTORY Family History  Problem Relation Age of Onset  . COPD Mother   . Breast cancer Sister 6   The patient's father is living, 41 years old as of may 2015. He lives in [place]. The patient's mother died from complications of COPD at the age of 84. These has 2 brothers, one sister. Her sister developed breast cancer at the age of 67. She is doing well. The patient herself underwent genetic testing at [facility] in 2011 and was found to be BRCA negative  GYNECOLOGIC HISTORY:  Menarche age 63, she is GX P0. She stopped having periods with her initial chemotherapy in 2006.  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 Her husband [name] [name] is a Medical illustrator in  [place]. He has a child from a prior marriage. At home they have 2 rescue dogs, [name] [name] and [name] [name]. The patient is religious but not a church attender    ADVANCED DIRECTIVES: In place; at the 08/04/2014 visit in particular the patient was very clear, with her husband present, that she would not want any kind of feeding tubes or "other tubes" if her condition deteriorated.   HEALTH MAINTENANCE: Social History  Substance Use Topics  . Smoking status: Never Smoker   . Smokeless tobacco: Never Used  . Alcohol Use: No     Colonoscopy:  PAP:  Bone density: March 2015; mild osteopenia  Lipid panel:  Allergies  Allergen Reactions  . 2nd Skin Quick Heal Other (See Comments)    Other Reaction: Skin peels  . Decadron [Dexamethasone] Other (See Comments)    Patient does not tolerate steroids.   . Dilaudid [Hydromorphone] Nausea And Vomiting  . Enoxaparin Other (See Comments)    unknown  . Fluconazole Swelling    Liver toxicity  . Hydromorphone Hcl Nausea And Vomiting  . Morphine And Related Nausea And Vomiting  . Protonix [Pantoprazole Sodium] Other (See Comments)    Patient reports it caused thrush.  Marland Kitchen  Tegaderm Ag Mesh [Silver]     Current Outpatient Prescriptions  Medication Sig Dispense Refill  . ALPRAZolam (XANAX) 0.5 MG tablet Take 1 tablet (0.5 mg total) by mouth 2 (two) times daily as needed for anxiety. 30 tablet 0  . B Complex-C (B-COMPLEX WITH VITAMIN C) tablet Take 1 tablet by mouth daily. Reported on 03/27/2015    . calcium carbonate (TUMS - DOSED IN MG ELEMENTAL CALCIUM) 500 MG chewable tablet Chew 1 tablet by mouth as directed.    . cholecalciferol 2000 UNITS tablet Take 1 tablet (2,000 Units total) by mouth daily.    . Diphenhyd-Hydrocort-Nystatin (FIRST-DUKES MOUTHWASH) SUSP 5-10 ml qid SWISH AND SPIT 292 mL 3  . folic acid (FOLVITE) 1 MG tablet Take 1 tablet (1 mg total) by mouth daily.    . magnesium chloride (SLOW-MAG) 64 MG TBEC SR tablet Take 60 mg by mouth  daily.    . Melatonin 3 MG TABS Take 3 mg by mouth at bedtime.    . naproxen sodium (ANAPROX) 220 MG tablet Take 220 mg by mouth 2 (two) times daily with a meal.    . palbociclib (IBRANCE) 75 MG capsule Take 1 capsule (75 mg total) by mouth daily with breakfast. Take whole with food. 21 capsule 6  . saccharomyces boulardii (FLORASTOR) 250 MG capsule Take 250 mg by mouth daily.      No current facility-administered medications for this visit.    OBJECTIVE: Middle-aged white woman Who appears stated age 55 Vitals:     There is no weight on file to calculate BMI.   Filed Vitals:     Patient refused vitals today 09/08/2015     ECOG FS: 1  Sclerae unicteric, pupils round and equal Oropharynx clear and moist-- no thrush or other lesions No cervical or supraclavicular adenopathy Lungs no rales or rhonchi Heart regular rate and rhythm Abd soft, nontender, positive bowel sounds MSK no focal spinal tenderness, no upper extremity lymphedema Neuro: nonfocal, well oriented, appropriate affect Breasts: deferred    LAB RESULTS:   CMP     Component Value Date/Time   NA 135* 08/28/2015 1153   NA 140 12/14/2014 0800   K 3.9 08/28/2015 1153   K 3.6 12/14/2014 0800   CL 103 12/14/2014 0800   CL 105 05/06/2012 1333   CO2 26 08/28/2015 1153   CO2 28 12/14/2014 0800   GLUCOSE 91 08/28/2015 1153   GLUCOSE 100* 12/14/2014 0800   GLUCOSE 124* 05/06/2012 1333   BUN 11.6 08/28/2015 1153   BUN 6 12/14/2014 0800   CREATININE 0.8 08/28/2015 1153   CREATININE 0.64 12/14/2014 0800   CALCIUM 9.5 08/28/2015 1153   CALCIUM 9.6 12/14/2014 0800   PROT 7.6 08/28/2015 1153   PROT 5.6* 11/20/2014 0433   ALBUMIN 4.1 08/28/2015 1153   ALBUMIN 2.6* 11/20/2014 0433   AST 23 08/28/2015 1153   AST 28 11/20/2014 0433   ALT 31 08/28/2015 1153   ALT 62* 11/20/2014 0433   ALKPHOS 65 08/28/2015 1153   ALKPHOS 105 11/20/2014 0433   BILITOT 0.31 08/28/2015 1153   BILITOT 0.7 11/20/2014 0433   GFRNONAA  >60 12/14/2014 0800   GFRAA >60 12/14/2014 0800    No results found for: SPEP  Lab Results  Component Value Date   WBC 3.9 09/03/2015   NEUTROABS 2.7 09/03/2015   HGB 12.9 09/03/2015   HCT 35.6 09/03/2015   MCV 97.3 09/03/2015   PLT 227 09/03/2015      Chemistry  Component Value Date/Time   NA 135* 08/28/2015 1153   NA 140 12/14/2014 0800   K 3.9 08/28/2015 1153   K 3.6 12/14/2014 0800   CL 103 12/14/2014 0800   CL 105 05/06/2012 1333   CO2 26 08/28/2015 1153   CO2 28 12/14/2014 0800   BUN 11.6 08/28/2015 1153   BUN 6 12/14/2014 0800   CREATININE 0.8 08/28/2015 1153   CREATININE 0.64 12/14/2014 0800      Component Value Date/Time   CALCIUM 9.5 08/28/2015 1153   CALCIUM 9.6 12/14/2014 0800   ALKPHOS 65 08/28/2015 1153   ALKPHOS 105 11/20/2014 0433   AST 23 08/28/2015 1153   AST 28 11/20/2014 0433   ALT 31 08/28/2015 1153   ALT 62* 11/20/2014 0433   BILITOT 0.31 08/28/2015 1153   BILITOT 0.7 11/20/2014 0433       CA 27.29 0.0 - 38.6 U/mL 1,773.7 (H)         No components found for: LABCA125  No results for input(s): INR in the last 168 hours.  Urinalysis    Component Value Date/Time   COLORURINE YELLOW 11/17/2014 0143   APPEARANCEUR CLOUDY* 11/17/2014 0143   LABSPEC 1.010 11/17/2014 0143   LABSPEC 1.005 09/12/2013 1542   PHURINE 6.5 11/17/2014 0143   PHURINE 6.0 09/12/2013 1542   GLUCOSEU NEGATIVE 11/17/2014 0143   GLUCOSEU Negative 09/12/2013 1542   HGBUR NEGATIVE 11/17/2014 0143   HGBUR Negative 09/12/2013 1542   BILIRUBINUR NEGATIVE 11/17/2014 0143   BILIRUBINUR Negative 09/12/2013 1542   KETONESUR NEGATIVE 11/17/2014 0143   KETONESUR Negative 09/12/2013 1542   PROTEINUR NEGATIVE 11/17/2014 0143   PROTEINUR Negative 09/12/2013 1542   UROBILINOGEN 0.2 11/17/2014 0143   UROBILINOGEN 0.2 09/12/2013 1542   NITRITE NEGATIVE 11/17/2014 0143   NITRITE Negative 09/12/2013 1542   LEUKOCYTESUR NEGATIVE 11/17/2014 0143   LEUKOCYTESUR Negative  09/12/2013 1542    STUDIES: Nm Pet Image Restag (ps) Skull Base To Thigh  09/05/2015  CLINICAL DATA:  Subsequent treatment strategy for breast cancer of upper inner quadrant of right breast. Nodal, liver, and bone metastasis. EXAM: NUCLEAR MEDICINE PET SKULL BASE TO THIGH TECHNIQUE: 7.8 mCi F-18 FDG was injected intravenously. Full-ring PET imaging was performed from the skull base to thigh after the radiotracer. CT data was obtained and used for attenuation correction and anatomic localization. FASTING BLOOD GLUCOSE:  Value: 94 mg/dl COMPARISON:  MRI of the abdomen of 08/06/2015. Most recent PET of 03/19/2015. An abdominal pelvic CT of 03/22/2015 is also reviewed. FINDINGS: NECK No thoracic nodal hypermetabolism. CHEST Left axillary hypermetabolic nodes. An example node measures 11 mm and a S.U.V. max of 6.5 on image 86/ series 4. Compare similar in size and a S.U.V. max of 11.2 on the prior exam. Left-sided mediastinal node measures 9 mm and a S.U.V. max of 6.6 on image 91/ series 4. Compare 10 mm and a S.U.V. max of 11.1 on the prior exam. A prevascular node measures 6 mm and a S.U.V. max of 5.0 on image 85/series 2. This node was similar in size and measured a S.U.V. max of 3.0 on the prior. Multifocal left-sided pleural hypermetabolism is again identified. Index area measures a S.U.V. max of 7.8, including on image 108/ series 4. Compare a S.U.V. max of 9.6 on the prior. Central right upper lobe pulmonary nodule measures 6 mm and a S.U.V. max of 1.5 today versus 11 mm and a S.U.V. max of 2.9 on the prior. ABDOMEN/PELVIS Progression of hepatic metastasis since the prior  PET. For example, lateral right hepatic lobe lesion measures on the order of 4.2 cm and a S.U.V. max of 7.2 today. Compare 2.4 cm and a S.U.V. max of 9.6 on the prior exam. This lesion is difficult to directly compare to the prior MRI of 08/06/2015, but may have enlarged minimally (where it measured 3.7 cm when remeasured in the same way on  that study.) A more central right hepatic lobe lesion measures 1.9 cm and a S.U.V. max of 7.3. This is new since the prior PET and similar in size to the most recent MRI. Hypermetabolism corresponding to a portal caval node. This measures 12 mm and a S.U.V. max of 7.7 on image 130/series 4. This is newly enlarged and hypermetabolic since the prior PET. The left periaortic retroperitoneal node measures 1.1 cm and a S.U.V. max of 6.5 today. Compare 1.5 cm and a S.U.V. max of 9.7 on the prior PET. SKELETON Improvement in pelvic osseous metastasis. For example, a right inferior pubic ramus lesion measures a S.U.V. max of 3.8 today versus a S.U.V. max of 9.3 on the prior exam. Sclerotic ninth rib lesion measures a S.U.V. max of 7.5 today versus a S.U.V. max of 8.6 on the prior exam. There is at least 1 new osseous lesion, eccentric right at T11. This measures a S.U.V. max of 7.8. CT IMAGES PERFORMED FOR ATTENUATION CORRECTION Right frontal and left parietal skull lytic lesions. Similar. No cervical adenopathy. Similar circumferential left-sided pleural thickening. Right axillary node dissection. Radiation fibrosis in the left apex and medial left upper lobe. Probable rounded atelectasis at the left lower lobe, similar. Abdominal aortic atherosclerosis. IMPRESSION: 1. Since the PET of 03/19/2015, the pulmonary parenchymal and thoracic nodal disease is overall improved. Left-sided pleural-based metastasis are similar to minimally improved. 2. Improvement in left abdominal retroperitoneal nodal metastasis. 3. Progression of hepatic metastasis and development of portal caval nodal metastasis. 4. Mixed response to therapy of osseous metastasis. Primarily improved with at least one new lesion in the T11 vertebral body. Electronically Signed   By: Abigail Miyamoto M.D.   On: 09/05/2015 16:11    ASSESSMENT: 55 y.o. [place] woman with stage IV breast cancer, history as follows  (1)  S/p Right lumpectomy and sentinel lymph  node sampling 03/15/2004 for a pT1c pN0. Stage IA invasive ductal carcinoma, grade 2, estrogen receptor 95% positive, progesterone receptor 65% positive, HER-2 not amplified; additional surgery 04/25/2004 for seroma or clearance showed no residual tumor  (2) adjuvant chemotherapy with cyclophosphamide and doxorubicin every 21 days x4 completed 07/19/2004  (3) adjuvant radiation given under Dr. Donella Stade in [place] completed July 2006  (4) the patient opted against adjuvant antiestrogen therapy  (5) genetics testing showed no BRCA mutations  (6) biopsy of a palpable right axillary mass 10/24/2009 showed invasive ductal carcinoma, grade 3, estrogen receptor 100% positive, progesterone receptor 2% positive (alert score 5) HER-2 negative; no evidence of systemic disease on PET scanning  (7) completed 3 of 4 planned cycles of docetaxel and cyclophosphamide September 2011, fourth cycle omitted because of marked elevations in liver function tests  (8) an right axillary lymph node dissection 03/06/2010 showed 3/8 lymph nodes removed to be involved by tumor, with extracapsular extension.  (9) 45 Gy radiation to the right axillary and right supraclavicular nodal areas, with capecitabine sensitization, completed March 2012   (10) intolerant of letrozole and exemestane; on tamoxifen with interruptions September 2012 to March 2013, but then continuing on tamoxifen more continuously through March of 2015  (11)  biopsy of mediastinal adenopathy 06/02/2013 shows invasive ductal carcinoma (gross cystic disease fluid protein positive, TTS-1 negative), estrogen receptor 80% positive, progesterone receptor 2% positive, HER-2 not amplified  (12) letrozole started March 2015-- tolerated with significant side effects, discontinued at the end of May 2015  (13) PET scan 08/16/2013 shows extensive left pleural metastatic disease and a large left pleural effusion that shifts cardiac and mediastinal structures to the  right; adenopathy (celiac trunk, periadrenal, periaortic); and a left medial clavicular lesion; Status post left thoracentesis 08/16/2013 positive for adenocarcinoma, estrogen receptor positive, progesterone receptor negative.  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 treated with Lovenox for 5-6 months  (c) right upper extremity lymphedema--receiving physical therapy  (d) hepatic steatosis with chronically elevated  LFTs as well as unusual hepatic sensitivity to chemotherapy  (e) osteopenia with the lowest T score -1.6 on bone density scan 06/20/2013  (f) radiation oncology (Dr Valere Dross) has reviewed prior radiation records in case there is further mediastinal involvement with dysphagia etc in which case palliative XRT could be considered  (a) radiation to left mediastinum/ left 7th rib 3250 cGy in 13 sessions04/18/2016 through 08/02/2014  (b) radiation to T11 area: 22 Gy in 7 sessions, last dose 11/27/2014  (c) radiation left parietal scalp region to be completed 12/22/2014  (d) radiation to sacral area completed 04/09/2015  (g) chest wall and perineal pain--improved post radiation treatments  (a) discussed celebrex/ carafate but holding off for now  (h) zoster diagnosed 04/04/2015-- on valacyclovir   PLAN: I spent more than 60 minutes with [name] [name] and [name] [name] today reviewing her situation. The PET scan includes some good news--overall I would read it as showing evidence of response. The "new" T11 lesion is of course not new and we will ask Dr Jobe Igo to review that but by my reading it is now 0.9 cm c/m 1.6 previously.   Wednesday has had multiple symptoms related to her cancer and the treatment, especially the palbociclib, but she has been ale to increase the dose slowly over the past 3 months and is now ready to start on a tablet a day for 21 days. If she can maintain that dose level the PET just obtained will serve as thee new baseline. We  will repeat a PT +/- a liver MRI late August or early September.  Her counts have remained excellent but we are ging tocontinue tofollow the CBC weekly through this cycle.She will receive her next denosumab dose late June and see me again 10/05/2015. She is also working to increase her functional status by walking longer distances 3x/wk w [name] [name].  She knows to call for any problems that might bdevelop before her next visit   Ayslin Kundert C, MD   09/08/2015 8:25 AM

## 2015-09-10 ENCOUNTER — Other Ambulatory Visit (HOSPITAL_BASED_OUTPATIENT_CLINIC_OR_DEPARTMENT_OTHER): Payer: 59

## 2015-09-10 ENCOUNTER — Ambulatory Visit: Payer: 59 | Admitting: Physical Therapy

## 2015-09-10 DIAGNOSIS — M79651 Pain in right thigh: Secondary | ICD-10-CM

## 2015-09-10 DIAGNOSIS — C50211 Malignant neoplasm of upper-inner quadrant of right female breast: Secondary | ICD-10-CM

## 2015-09-10 DIAGNOSIS — I890 Lymphedema, not elsewhere classified: Secondary | ICD-10-CM | POA: Diagnosis not present

## 2015-09-10 DIAGNOSIS — C771 Secondary and unspecified malignant neoplasm of intrathoracic lymph nodes: Secondary | ICD-10-CM | POA: Diagnosis not present

## 2015-09-10 DIAGNOSIS — J90 Pleural effusion, not elsewhere classified: Secondary | ICD-10-CM

## 2015-09-10 LAB — CBC WITH DIFFERENTIAL/PLATELET
BASO%: 1.4 % (ref 0.0–2.0)
Basophils Absolute: 0.1 10*3/uL (ref 0.0–0.1)
EOS%: 1.7 % (ref 0.0–7.0)
Eosinophils Absolute: 0.1 10*3/uL (ref 0.0–0.5)
HCT: 35.5 % (ref 34.8–46.6)
HEMOGLOBIN: 12.7 g/dL (ref 11.6–15.9)
LYMPH#: 0.8 10*3/uL — AB (ref 0.9–3.3)
LYMPH%: 23 % (ref 14.0–49.7)
MCH: 35.3 pg — ABNORMAL HIGH (ref 25.1–34.0)
MCHC: 35.8 g/dL (ref 31.5–36.0)
MCV: 98.6 fL (ref 79.5–101.0)
MONO#: 0.6 10*3/uL (ref 0.1–0.9)
MONO%: 15.4 % — AB (ref 0.0–14.0)
NEUT%: 58.5 % (ref 38.4–76.8)
NEUTROS ABS: 2.1 10*3/uL (ref 1.5–6.5)
NRBC: 0 % (ref 0–0)
Platelets: 257 10*3/uL (ref 145–400)
RBC: 3.6 10*6/uL — AB (ref 3.70–5.45)
RDW: 13.6 % (ref 11.2–14.5)
WBC: 3.6 10*3/uL — AB (ref 3.9–10.3)

## 2015-09-10 NOTE — Therapy (Signed)
[place], [place], [address] Phone: [phone]   Fax:  [phone]  Physical Therapy Treatment  Patient Details  Name: [name] [name] MRN: [id number] Date of Birth: 1960-04-29 Referring Provider: Dr. Lurline Del  Encounter Date: 09/10/2015      PT End of Session - 09/10/15 1202    Visit Number 21  45 for lymphedema   Number of Visits 66  for lymphedema   Date for PT Re-Evaluation 09/17/15   PT Start Time 1025   PT Stop Time 1106   PT Time Calculation (min) 41 min   Activity Tolerance Patient tolerated treatment well   Behavior During Therapy [facility] for tasks assessed/performed      Past Medical History  Diagnosis Date  . Seizures ([place]) 2010    Isolated incident.  Marland Kitchen PONV (postoperative nausea and vomiting)   . Peripheral vascular disease ([place]) 02/2010    blood clot related to porta cath  . S/P radiation therapy 07/17/2014 through 08/02/2014     Left mediastinum, left seventh rib 3250 cGy in 13 sessions   . S/P radiation therapy 12/11/2014 through 12/22/2014     Left parietal calvarium 2400 cGy in 8 sessions   . Breast cancer ([place]) dx'd 2005/2011  . Bone metastases ([place]) dx'd 05/2014    Past Surgical History  Procedure Laterality Date  . Breast lumpectomy  2005  . Axillary lymph node dissection  Dec. 2011  . Portacath placement  12/11  . Removal portacath    . Mediastinotomy chamberlain mcneil Left 06/02/2013    Procedure: MEDIASTINOTOMY CHAMBERLAIN MCNEIL;  Surgeon: [name], MD;  Location: [facility];  Service: Thoracic;  Laterality: Left;  LEFT ANTERIOR MEDIASTINOTOMY     There were no vitals filed for this visit.      Subjective Assessment - 09/10/15 1026    Subjective Walked a half mile holding onto her husband's  arm, as well as walked some to do errands; had to sit at one point and pain was at 4/10 after doing all that.   Currently in Pain? Yes   Pain Score 2    Pain Location Groin   Pain Orientation Right   Pain Score 6   Pain Location Axilla   Pain Orientation Right   Pain Descriptors / Indicators Other (Comment)  full                         [facility] Adult PT Treatment/Exercise - 09/10/15 0001    Manual Therapy   Myofascial Release right axilla with focus on scar tightness   Manual Lymphatic Drainage (MLD) In left sidelying, posterior interaxillary anastomosis and right axillo-inguinal anastomosis; in supine, short neck, left axilla and anterior interaxillary anastomosis, right groin and axillo-inguinal anastomosis, area between right breast scars, directing toward pathways, and right upper arm.  In right sidelying, left periscapular area toward left groin.   Other Manual Therapy soft tissue work at superior and entire aspect of right medial thigh in right sidelying for pain relief and muscle relaxation; gentle myofascial release                  PT Short Term Goals - 02/27/15 1011    PT SHORT TERM GOAL #1   Title pain with walking decreased >/= 25%   Time 3   Period Weeks   Status Achieved   PT SHORT TERM GOAL #2   Title pelvis stays in correct alignment for  3 weeks   Time 3   Period Weeks   Status Achieved           PT Long Term Goals - 08/14/15 1317    PT LONG TERM GOAL #1   Title indpendent with HEP   Time 12   Period Weeks   Status On-going   PT LONG TERM GOAL #2   Title pain with walking decreased >/= 80%   Time 12   Period Weeks   Status On-going   PT LONG TERM GOAL #3   Title go up and down steps with a step over step pattern   Time 6   Status Deferred   PT LONG TERM GOAL #4   Title being able to last for 5 days without therapy and pain not going above a 8/10 due to decreased muscle soreness   Time 12   Period Weeks   Status Achieved            Long Term Clinic Goals - 09/07/15 1209    CC Long Term Goal  #2   Title Pt. will report swelling is adequately managed to enable ADL function at a consistent level.   Status On-going   CC Long Term Goal  #4   Title Pain/discomfort at right axilla area will be controlled at 6/10 or less.   Status On-going            Plan - 09/10/15 1203    Clinical Impression Statement Hopes to be able to try her Flexitouch soon, but this is  limited by the pain she has at right groin, which is exacerbated by the trunk part of her pump.  Continues to benefit from therapy.   Rehab Potential Good   Clinical Impairments Affecting Rehab Potential active cancer   PT Frequency 2x / week   PT Duration 12 weeks   PT Treatment/Interventions Manual techniques;Manual lymph drainage   PT Next Visit Plan manual lymph drainage and soft tissue work for lymphedema and pain   Consulted and Agree with Plan of Care Patient      Patient will benefit from skilled therapeutic intervention in order to improve the following deficits and impairments:  Pain, Increased muscle spasms, Difficulty walking, Increased edema  Visit Diagnosis: Lymphedema, not elsewhere classified  Pain in right thigh     Problem List Patient Active Problem List   Diagnosis Date Noted  . 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 Zoster 04/04/2015  . Nausea with vomiting 11/18/2014  . Constipation 11/18/2014  . Left-sided thoracic back pain   . Bone metastases ([place]) 11/16/2014  . Back pain 11/15/2014  . Uncontrolled pain 11/14/2014  . 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 Chest wall pain 03/21/2014  . Abnormal LFTs (liver function tests) 09/12/2013  . Breast cancer of upper-inner quadrant of right female breast ([place]) 08/18/2013  . 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

## 2015-09-14 ENCOUNTER — Ambulatory Visit: Payer: 59 | Admitting: Physical Therapy

## 2015-09-14 DIAGNOSIS — I890 Lymphedema, not elsewhere classified: Secondary | ICD-10-CM | POA: Diagnosis not present

## 2015-09-14 DIAGNOSIS — M79651 Pain in right thigh: Secondary | ICD-10-CM

## 2015-09-14 NOTE — Therapy (Signed)
[place], [place], [address] Phone: [phone]   Fax:  [phone]  Physical Therapy Treatment  Patient Details  Name: [name] [name] MRN: [id number] Date of Birth: 1961/03/27 Referring Provider: Dr. Lurline Del  Encounter Date: 09/14/2015      PT End of Session - 09/14/15 1208    Visit Number 23  46 for lymphedema   Number of Visits 69  for lymphedema   Date for PT Re-Evaluation 09/17/15   PT Start Time 1020   PT Stop Time 1059   PT Time Calculation (min) 39 min   Activity Tolerance Patient tolerated treatment well   Behavior During Therapy [facility] for tasks assessed/performed      Past Medical History  Diagnosis Date  . Seizures ([place]) 2010    Isolated incident.  Marland Kitchen PONV (postoperative nausea and vomiting)   . Peripheral vascular disease ([place]) 02/2010    blood clot related to porta cath  . S/P radiation therapy 07/17/2014 through 08/02/2014     Left mediastinum, left seventh rib 3250 cGy in 13 sessions   . S/P radiation therapy 12/11/2014 through 12/22/2014     Left parietal calvarium 2400 cGy in 8 sessions   . Breast cancer ([place]) dx'd 2005/2011  . Bone metastases ([place]) dx'd 05/2014    Past Surgical History  Procedure Laterality Date  . Breast lumpectomy  2005  . Axillary lymph node dissection  Dec. 2011  . Portacath placement  12/11  . Removal portacath    . Mediastinotomy chamberlain mcneil Left 06/02/2013    Procedure: MEDIASTINOTOMY CHAMBERLAIN MCNEIL;  Surgeon: [name], MD;  Location: [facility];  Service: Thoracic;  Laterality: Left;  LEFT ANTERIOR MEDIASTINOTOMY     There were no vitals filed for this visit.      Subjective Assessment - 09/14/15 1022    Currently in Pain? Yes   Pain Score 3    Pain Location  Groin   Pain Orientation Right   Pain Descriptors / Indicators Tightness;Other (Comment)  feels like a rope that's twisted   Pain Frequency Constant   Aggravating Factors  more walking   Pain Relieving Factors therapy   Pain Score 6   Pain Location Axilla   Pain Orientation Right   Pain Descriptors / Indicators Other (Comment)  full   Aggravating Factors  more swelling   Pain Relieving Factors therapy                         [facility] Adult PT Treatment/Exercise - 09/14/15 0001    Manual Therapy   Myofascial Release right axilla with focus on scar tightness   Manual Lymphatic Drainage (MLD) In left sidelying, posterior interaxillary anastomosis and right axillo-inguinal anastomosis; in supine, short neck, left axilla and anterior interaxillary anastomosis, right groin and axillo-inguinal anastomosis, area between right breast scars, directing toward pathways, and right upper arm.  In right sidelying, left periscapular area toward left groin.   Other Manual Therapy soft tissue work at superior and entire aspect of right medial thigh in right sidelying for pain relief and muscle relaxation; worked farther posterior today due to tightness throughout the area                  PT Short Term Goals - 02/27/15 1011    PT SHORT TERM GOAL #1   Title pain with walking decreased >/= 25%   Time 3   Period Weeks  Status Achieved   PT SHORT TERM GOAL #2   Title pelvis stays in correct alignment for 3 weeks   Time 3   Period Weeks   Status Achieved           PT Long Term Goals - 08/14/15 1317    PT LONG TERM GOAL #1   Title indpendent with HEP   Time 12   Period Weeks   Status On-going   PT LONG TERM GOAL #2   Title pain with walking decreased >/= 80%   Time 12   Period Weeks   Status On-going   PT LONG TERM GOAL #3   Title go up and down steps with a step over step pattern   Time 6   Status Deferred   PT LONG TERM GOAL #4   Title being able to last  for 5 days without therapy and pain not going above a 8/10 due to decreased muscle soreness   Time 12   Period Weeks   Status Achieved           Long Term Clinic Goals - 09/14/15 1211    CC Long Term Goal  #2   Title Pt. will report swelling is adequately managed to enable ADL function at a consistent level.   Status On-going   CC Long Term Goal  #4   Title Pain/discomfort at right axilla area will be controlled at 6/10 or less.   Status On-going            Plan - 09/14/15 1209    Clinical Impression Statement more tightness palpated at right medial thigh area today, helped by soft tissue work; swelling at right upper outer breast continues   Rehab Potential Good   Clinical Impairments Affecting Rehab Potential active cancer   PT Frequency 2x / week   PT Duration 12 weeks   PT Treatment/Interventions Manual techniques;Manual lymph drainage   PT Next Visit Plan Continue manual lymph drainage in left sidelying to posterior interaxillary anastomosis and right axillo-inguinal anastomosis; also in supine for right upper outer breast; soft tissue work prn right medial thigh.  Renewal to be done at end of next week.   Consulted and Agree with Plan of Care Patient      Patient will benefit from skilled therapeutic intervention in order to improve the following deficits and impairments:  Pain, Increased muscle spasms, Difficulty walking, Increased edema  Visit Diagnosis: Lymphedema, not elsewhere classified  Pain in right thigh     Problem List Patient Active Problem List   Diagnosis Date Noted  . 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 Zoster 04/04/2015  . Nausea with vomiting 11/18/2014  . Constipation 11/18/2014  . Left-sided thoracic back pain   . Bone metastases ([place]) 11/16/2014  . Back pain 11/15/2014  . Uncontrolled pain 11/14/2014  . 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 Chest wall pain 03/21/2014  . Abnormal LFTs (liver function tests)  09/12/2013  . Breast cancer of upper-inner quadrant of right female breast ([place]) 08/18/2013  . 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

## 2015-09-17 ENCOUNTER — Other Ambulatory Visit (HOSPITAL_BASED_OUTPATIENT_CLINIC_OR_DEPARTMENT_OTHER): Payer: 59

## 2015-09-17 ENCOUNTER — Ambulatory Visit: Payer: 59

## 2015-09-17 DIAGNOSIS — C771 Secondary and unspecified malignant neoplasm of intrathoracic lymph nodes: Secondary | ICD-10-CM

## 2015-09-17 DIAGNOSIS — M79651 Pain in right thigh: Secondary | ICD-10-CM

## 2015-09-17 DIAGNOSIS — I890 Lymphedema, not elsewhere classified: Secondary | ICD-10-CM

## 2015-09-17 DIAGNOSIS — J90 Pleural effusion, not elsewhere classified: Secondary | ICD-10-CM

## 2015-09-17 DIAGNOSIS — C50211 Malignant neoplasm of upper-inner quadrant of right female breast: Secondary | ICD-10-CM | POA: Diagnosis not present

## 2015-09-17 DIAGNOSIS — M62838 Other muscle spasm: Secondary | ICD-10-CM

## 2015-09-17 LAB — CBC WITH DIFFERENTIAL/PLATELET
BASO%: 1.2 % (ref 0.0–2.0)
Basophils Absolute: 0 10*3/uL (ref 0.0–0.1)
EOS ABS: 0.1 10*3/uL (ref 0.0–0.5)
EOS%: 1.9 % (ref 0.0–7.0)
HEMATOCRIT: 34.3 % — AB (ref 34.8–46.6)
HEMOGLOBIN: 12.3 g/dL (ref 11.6–15.9)
LYMPH#: 0.7 10*3/uL — AB (ref 0.9–3.3)
LYMPH%: 21 % (ref 14.0–49.7)
MCH: 35.2 pg — ABNORMAL HIGH (ref 25.1–34.0)
MCHC: 35.9 g/dL (ref 31.5–36.0)
MCV: 98.3 fL (ref 79.5–101.0)
MONO#: 0.3 10*3/uL (ref 0.1–0.9)
MONO%: 8.3 % (ref 0.0–14.0)
NEUT%: 67.6 % (ref 38.4–76.8)
NEUTROS ABS: 2.2 10*3/uL (ref 1.5–6.5)
NRBC: 0 % (ref 0–0)
PLATELETS: 290 10*3/uL (ref 145–400)
RBC: 3.49 10*6/uL — ABNORMAL LOW (ref 3.70–5.45)
RDW: 13.7 % (ref 11.2–14.5)
WBC: 3.2 10*3/uL — AB (ref 3.9–10.3)

## 2015-09-17 NOTE — Therapy (Signed)
[place], [place], [address] Phone: [phone]   Fax:  [phone]  Physical Therapy Treatment  Patient Details  Name: [name] [name] MRN: [id number] Date of Birth: May 31, 1960 Referring Provider: Dr. Lurline Del  Encounter Date: 09/17/2015      PT End of Session - 09/17/15 1222    Visit Number 77  47 for lymphedema   Number of Visits 39  47 for lymphedema   Date for PT Re-Evaluation 09/17/15   PT Start Time 1018   PT Stop Time 1059   PT Time Calculation (min) 41 min   Activity Tolerance Patient tolerated treatment well   Behavior During Therapy [facility] for tasks assessed/performed      Past Medical History  Diagnosis Date  . Seizures ([place]) 2010    Isolated incident.  Marland Kitchen PONV (postoperative nausea and vomiting)   . Peripheral vascular disease ([place]) 02/2010    blood clot related to porta cath  . S/P radiation therapy 07/17/2014 through 08/02/2014     Left mediastinum, left seventh rib 3250 cGy in 13 sessions   . S/P radiation therapy 12/11/2014 through 12/22/2014     Left parietal calvarium 2400 cGy in 8 sessions   . Breast cancer ([place]) dx'd 2005/2011  . Bone metastases ([place]) dx'd 05/2014    Past Surgical History  Procedure Laterality Date  . Breast lumpectomy  2005  . Axillary lymph node dissection  Dec. 2011  . Portacath placement  12/11  . Removal portacath    . Mediastinotomy chamberlain mcneil Left 06/02/2013    Procedure: MEDIASTINOTOMY CHAMBERLAIN MCNEIL;  Surgeon: [name], MD;  Location: [facility];  Service: Thoracic;  Laterality: Left;  LEFT ANTERIOR MEDIASTINOTOMY     There were no vitals filed for this visit.      Subjective Assessment - 09/17/15 1022    Subjective My dog woke me up last night due to the storm  so I'm more tired than usual today. My Rt trunk just feels really full.   Currently in Pain? Yes   Pain Score 3    Pain Location Groin   Pain Orientation Right   Pain Descriptors / Indicators Tightness   Pain Type Chronic pain   Pain Onset More than a month ago   Pain Frequency Constant   Aggravating Factors  more walking   Pain Relieving Factors threapy, stretching                         [facility] Adult PT Treatment/Exercise - 09/17/15 0001    Manual Therapy   Myofascial Release right axilla with focus on scar tightness   Manual Lymphatic Drainage (MLD) In left sidelying, posterior interaxillary anastomosis and right axillo-inguinal anastomosis; in supine, short neck, left axilla and anterior interaxillary anastomosis, right groin and axillo-inguinal anastomosis, area between right breast scars, directing toward pathways, and right upper arm.  In right sidelying, left periscapular area toward left groin.   Other Manual Therapy soft tissue work at superior and entire aspect of right medial thigh in right sidelying for pain relief and muscle relaxation, gentle myofascial release                  PT Short Term Goals - 02/27/15 1011    PT SHORT TERM GOAL #1   Title pain with walking decreased >/= 25%   Time 3   Period Weeks   Status Achieved   PT SHORT TERM GOAL #  2   Title pelvis stays in correct alignment for 3 weeks   Time 3   Period Weeks   Status Achieved           PT Long Term Goals - 08/14/15 1317    PT LONG TERM GOAL #1   Title indpendent with HEP   Time 12   Period Weeks   Status On-going   PT LONG TERM GOAL #2   Title pain with walking decreased >/= 80%   Time 12   Period Weeks   Status On-going   PT LONG TERM GOAL #3   Title go up and down steps with a step over step pattern   Time 6   Status Deferred   PT LONG TERM GOAL #4   Title being able to last for 5 days without therapy and pain not going above a 8/10 due to decreased muscle  soreness   Time 12   Period Weeks   Status Achieved           Long Term Clinic Goals - 09/14/15 1211    CC Long Term Goal  #2   Title Pt. will report swelling is adequately managed to enable ADL function at a consistent level.   Status On-going   CC Long Term Goal  #4   Title Pain/discomfort at right axilla area will be controlled at 6/10 or less.   Status On-going            Plan - 09/17/15 1223    Clinical Impression Statement Swelling at Rt upper outer breast was softened by manual lymph drainage today and pt reports her Rt medial thigh tightness is slowly, but steadly improving.   Rehab Potential Good   Clinical Impairments Affecting Rehab Potential active cancer   PT Frequency 2x / week   PT Duration 12 weeks   PT Treatment/Interventions Manual techniques;Manual lymph drainage   PT Next Visit Plan Continue manual lymph drainage in left sidelying to posterior interaxillary anastomosis and right axillo-inguinal anastomosis; also in supine for right upper outer breast; soft tissue work prn right medial thigh.  Renewal to be done at end of this week.   Consulted and Agree with Plan of Care Patient      Patient will benefit from skilled therapeutic intervention in order to improve the following deficits and impairments:  Pain, Increased muscle spasms, Difficulty walking, Increased edema  Visit Diagnosis: Lymphedema, not elsewhere classified  Pain in right thigh  Other muscle spasm     Problem List Patient Active Problem List   Diagnosis Date Noted  . 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 Zoster 04/04/2015  . Nausea with vomiting 11/18/2014  . Constipation 11/18/2014  . Left-sided thoracic back pain   . Bone metastases ([place]) 11/16/2014  . Back pain 11/15/2014  . Uncontrolled pain 11/14/2014  . 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 Chest wall pain 03/21/2014  . Abnormal LFTs (liver function tests) 09/12/2013  . Breast cancer of  upper-inner quadrant of right female breast ([place]) 08/18/2013  . 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

## 2015-09-21 ENCOUNTER — Ambulatory Visit: Payer: 59 | Admitting: Physical Therapy

## 2015-09-21 DIAGNOSIS — I890 Lymphedema, not elsewhere classified: Secondary | ICD-10-CM | POA: Diagnosis not present

## 2015-09-21 DIAGNOSIS — M79651 Pain in right thigh: Secondary | ICD-10-CM

## 2015-09-21 NOTE — Therapy (Signed)
[place], [place], [address] Phone: [phone]   Fax:  [phone]  Physical Therapy Treatment  Patient Details  Name: [name] [name] MRN: [id number] Date of Birth: 18-Jul-1960 Referring Provider: Dr. Lurline Del  Encounter Date: 09/21/2015      PT End of Session - 09/21/15 1210    Visit Number 36  48 for lymphedema   Number of Visits 72  for lymphedema   Date for PT Re-Evaluation 12/10/15   Authorization - Visit Number 140   PT Start Time 1019   PT Stop Time 1104   PT Time Calculation (min) 45 min   Activity Tolerance Patient tolerated treatment well   Behavior During Therapy [facility] for tasks assessed/performed      Past Medical History  Diagnosis Date  . Seizures ([place]) 2010    Isolated incident.  Marland Kitchen PONV (postoperative nausea and vomiting)   . Peripheral vascular disease ([place]) 02/2010    blood clot related to porta cath  . S/P radiation therapy 07/17/2014 through 08/02/2014     Left mediastinum, left seventh rib 3250 cGy in 13 sessions   . S/P radiation therapy 12/11/2014 through 12/22/2014     Left parietal calvarium 2400 cGy in 8 sessions   . Breast cancer ([place]) dx'd 2005/2011  . Bone metastases ([place]) dx'd 05/2014    Past Surgical History  Procedure Laterality Date  . Breast lumpectomy  2005  . Axillary lymph node dissection  Dec. 2011  . Portacath placement  12/11  . Removal portacath    . Mediastinotomy chamberlain mcneil Left 06/02/2013    Procedure: MEDIASTINOTOMY CHAMBERLAIN MCNEIL;  Surgeon: [name], MD;  Location: [facility];  Service: Thoracic;  Laterality: Left;  LEFT ANTERIOR MEDIASTINOTOMY     There were no vitals filed for this visit.      Subjective Assessment - 09/21/15 1021    Subjective Feeling  bloated from the constipation from the medication.   Currently in Pain? Yes   Pain Score 3   sometimes shooting to 4   Pain Location Groin   Pain Orientation Right   Pain Score 6   Pain Location Axilla   Pain Orientation Right   Aggravating Factors  more swelling   Pain Relieving Factors therapy                         [facility] Adult PT Treatment/Exercise - 09/21/15 0001    Manual Therapy   Myofascial Release right axilla with focus on scar tightness   Manual Lymphatic Drainage (MLD) In left sidelying, posterior interaxillary anastomosis and right axillo-inguinal anastomosis; in supine, short neck, left axilla and anterior interaxillary anastomosis, right groin and axillo-inguinal anastomosis, area between right breast scars, directing toward pathways, and right upper arm.  In right sidelying, left periscapular area toward left groin.   Other Manual Therapy soft tissue work at superior and entire aspect of right medial thigh in right sidelying for pain relief and muscle relaxation; worked farther posterior today due to tightness throughout the area                  PT Short Term Goals - 02/27/15 1011    PT SHORT TERM GOAL #1   Title pain with walking decreased >/= 25%   Time 3   Period Weeks   Status Achieved   PT SHORT TERM GOAL #2   Title pelvis stays in correct alignment for 3 weeks  Time 3   Period Weeks   Status Achieved           PT Long Term Goals - 08/14/15 1317    PT LONG TERM GOAL #1   Title indpendent with HEP   Time 12   Period Weeks   Status On-going   PT LONG TERM GOAL #2   Title pain with walking decreased >/= 80%   Time 12   Period Weeks   Status On-going   PT LONG TERM GOAL #3   Title go up and down steps with a step over step pattern   Time 6   Status Deferred   PT LONG TERM GOAL #4   Title being able to last for 5 days without therapy and pain not going above a 8/10 due to decreased muscle soreness   Time 12   Period  Weeks   Status Achieved           Long Term Clinic Goals - 09/21/15 1215    CC Long Term Goal  #2   Title Pt. will report swelling is adequately managed to enable ADL function at a consistent level.   Status On-going   CC Long Term Goal  #4   Title Pain/discomfort at right axilla area will be controlled at 6/10 or less.   Status On-going   CC Long Term Goal  #5   Title Patient will avoid infection by ongoing management of her lymphedema at right breast/axilla/upper arm areas.   Time 12   Period Weeks   Status New            Plan - 09/21/15 1213    Clinical Impression Statement Ongoing benefit of lymphedema and soft tissue work therapy to enable patient to continue to function in ADLs.   Rehab Potential Good   Clinical Impairments Affecting Rehab Potential active cancer   PT Frequency 2x / week   PT Duration 12 weeks   PT Treatment/Interventions Manual techniques;Manual lymph drainage   PT Next Visit Plan Continue manual lymph drainage in left sidelying to posterior interaxillary anastomosis and right axillo-inguinal anastomosis; also in supine for right upper outer breast; soft tissue work prn right medial thigh.   Consulted and Agree with Plan of Care Patient   PT Plan Continue manual lymph drainage, soft tissue work.      Patient will benefit from skilled therapeutic intervention in order to improve the following deficits and impairments:  Pain, Increased muscle spasms, Difficulty walking, Increased edema  Visit Diagnosis: Lymphedema, not elsewhere classified - Plan: PT plan of care cert/re-cert  Pain in right thigh - Plan: PT plan of care cert/re-cert     Problem List Patient Active Problem List   Diagnosis Date Noted  . 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 Zoster 04/04/2015  . Nausea with vomiting 11/18/2014  . Constipation 11/18/2014  . Left-sided thoracic back pain   . Bone metastases ([place]) 11/16/2014  . Back pain 11/15/2014  . Uncontrolled  pain 11/14/2014  . 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 Chest wall pain 03/21/2014  . Abnormal LFTs (liver function tests) 09/12/2013  . Breast cancer of upper-inner quadrant of right female breast ([place]) 08/18/2013  . 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

## 2015-09-24 ENCOUNTER — Ambulatory Visit: Payer: 59 | Admitting: Physical Therapy

## 2015-09-24 ENCOUNTER — Other Ambulatory Visit (HOSPITAL_BASED_OUTPATIENT_CLINIC_OR_DEPARTMENT_OTHER): Payer: 59

## 2015-09-24 DIAGNOSIS — I890 Lymphedema, not elsewhere classified: Secondary | ICD-10-CM

## 2015-09-24 DIAGNOSIS — M79651 Pain in right thigh: Secondary | ICD-10-CM

## 2015-09-24 DIAGNOSIS — C7951 Secondary malignant neoplasm of bone: Secondary | ICD-10-CM

## 2015-09-24 DIAGNOSIS — J90 Pleural effusion, not elsewhere classified: Secondary | ICD-10-CM

## 2015-09-24 DIAGNOSIS — C50211 Malignant neoplasm of upper-inner quadrant of right female breast: Secondary | ICD-10-CM

## 2015-09-24 DIAGNOSIS — C771 Secondary and unspecified malignant neoplasm of intrathoracic lymph nodes: Secondary | ICD-10-CM

## 2015-09-24 LAB — CBC WITH DIFFERENTIAL/PLATELET
BASO%: 1.7 % (ref 0.0–2.0)
BASOS ABS: 0.1 10*3/uL (ref 0.0–0.1)
EOS ABS: 0.1 10*3/uL (ref 0.0–0.5)
EOS%: 1.8 % (ref 0.0–7.0)
HEMATOCRIT: 35.8 % (ref 34.8–46.6)
HEMOGLOBIN: 12.3 g/dL (ref 11.6–15.9)
LYMPH#: 0.7 10*3/uL — AB (ref 0.9–3.3)
LYMPH%: 22.2 % (ref 14.0–49.7)
MCH: 35.1 pg — AB (ref 25.1–34.0)
MCHC: 34.3 g/dL (ref 31.5–36.0)
MCV: 102.4 fL — AB (ref 79.5–101.0)
MONO#: 0.3 10*3/uL (ref 0.1–0.9)
MONO%: 8.9 % (ref 0.0–14.0)
NEUT%: 65.4 % (ref 38.4–76.8)
NEUTROS ABS: 1.9 10*3/uL (ref 1.5–6.5)
Platelets: 287 10*3/uL (ref 145–400)
RBC: 3.5 10*6/uL — AB (ref 3.70–5.45)
RDW: 14.4 % (ref 11.2–14.5)
WBC: 2.9 10*3/uL — AB (ref 3.9–10.3)

## 2015-09-24 LAB — COMPREHENSIVE METABOLIC PANEL
ALK PHOS: 54 U/L (ref 40–150)
ALT: 33 U/L (ref 0–55)
ANION GAP: 8 meq/L (ref 3–11)
AST: 24 U/L (ref 5–34)
Albumin: 4 g/dL (ref 3.5–5.0)
BILIRUBIN TOTAL: 0.36 mg/dL (ref 0.20–1.20)
BUN: 10.8 mg/dL (ref 7.0–26.0)
CO2: 28 meq/L (ref 22–29)
Calcium: 9.6 mg/dL (ref 8.4–10.4)
Chloride: 99 mEq/L (ref 98–109)
Creatinine: 0.9 mg/dL (ref 0.6–1.1)
EGFR: 75 mL/min/{1.73_m2} — AB (ref >90)
GLUCOSE: 82 mg/dL (ref 70–140)
POTASSIUM: 4.2 meq/L (ref 3.5–5.1)
SODIUM: 135 meq/L — AB (ref 136–145)
TOTAL PROTEIN: 7.3 g/dL (ref 6.4–8.3)

## 2015-09-24 NOTE — Therapy (Signed)
[place], [place], [address] Phone: [phone]   Fax:  [phone]  Physical Therapy Treatment  Patient Details  Name: [name] [name] MRN: [id number] Date of Birth: [birth year] Referring Provider: Dr. Lurline Del  Encounter Date: 09/24/2015      PT End of Session - 09/24/15 1217    Visit Number 45  49 for lymph   Number of Visits 72  for lymph   Date for PT Re-Evaluation 12/10/15   Authorization - Visit Number 140   PT Start Time [id number]   PT Stop Time 1100   PT Time Calculation (min) 45 min   Activity Tolerance Patient tolerated treatment well   Behavior During Therapy [facility] for tasks assessed/performed      Past Medical History  Diagnosis Date  . Seizures ([place]) 2010    Isolated incident.  Marland Kitchen PONV (postoperative nausea and vomiting)   . Peripheral vascular disease ([place]) 02/2010    blood clot related to porta cath  . S/P radiation therapy 07/17/2014 through 08/02/2014     Left mediastinum, left seventh rib 3250 cGy in 13 sessions   . S/P radiation therapy 12/11/2014 through 12/22/2014     Left parietal calvarium 2400 cGy in 8 sessions   . Breast cancer ([place]) dx'd 2005/2011  . Bone metastases ([place]) dx'd 05/2014    Past Surgical History  Procedure Laterality Date  . Breast lumpectomy  2005  . Axillary lymph node dissection  Dec. 2011  . Portacath placement  12/11  . Removal portacath    . Mediastinotomy chamberlain mcneil Left 06/02/2013    Procedure: MEDIASTINOTOMY CHAMBERLAIN MCNEIL;  Surgeon: [name], MD;  Location: [place];  Service: Thoracic;  Laterality: Left;  LEFT ANTERIOR MEDIASTINOTOMY     There were no vitals filed for this visit.      Subjective Assessment - 09/24/15 1016    Subjective "I get to see the baby  (great niece) tomorrow!"  Family is coming for a short visit this week.   Currently in Pain? Yes   Pain Score 3   2-3   Pain Location Groin   Pain Orientation Right   Pain Score 6   Pain Location Axilla   Pain Orientation Right                         [facility] Adult PT Treatment/Exercise - 09/24/15 0001    Manual Therapy   Myofascial Release right axilla with focus on scar tightness   Manual Lymphatic Drainage (MLD) In left sidelying, posterior interaxillary anastomosis and right axillo-inguinal anastomosis; in supine, short neck, left axilla and anterior interaxillary anastomosis, right groin and axillo-inguinal anastomosis, area between right breast scars, directing toward pathways, and right upper arm.  In right sidelying, left periscapular area toward left groin.   Other Manual Therapy soft tissue work at superior and entire aspect of right medial thigh in right sidelying for pain relief and muscle relaxation, gentle myofascial release                  PT Short Term Goals - 02/27/15 1011    PT SHORT TERM GOAL #1   Title pain with walking decreased >/= 25%   Time 3   Period Weeks   Status Achieved   PT SHORT TERM GOAL #2   Title pelvis stays in correct alignment for 3 weeks   Time 3   Period Weeks   Status Achieved  PT Long Term Goals - 08/14/15 1317    PT LONG TERM GOAL #1   Title indpendent with HEP   Time 12   Period Weeks   Status On-going   PT LONG TERM GOAL #2   Title pain with walking decreased >/= 80%   Time 12   Period Weeks   Status On-going   PT LONG TERM GOAL #3   Title go up and down steps with a step over step pattern   Time 6   Status Deferred   PT LONG TERM GOAL #4   Title being able to last for 5 days without therapy and pain not going above a 8/10 due to decreased muscle soreness   Time 12   Period Weeks   Status Achieved           Long Term Clinic Goals - 09/21/15 1215    CC Long Term Goal  #2    Title Pt. will report swelling is adequately managed to enable ADL function at a consistent level.   Status On-going   CC Long Term Goal  #4   Title Pain/discomfort at right axilla area will be controlled at 6/10 or less.   Status On-going   CC Long Term Goal  #5   Title Patient will avoid infection by ongoing management of her lymphedema at right breast/axilla/upper arm areas.   Time 12   Period Weeks   Status New            Plan - 09/24/15 1218    Clinical Impression Statement Ongoing benefit.  Patient is doing better with walking and not having groin/thigh pain increase.  She is hopeful she may be able to try her Flexitouch pump soon if that pain stays low (since the trunk piece of that was a cause of irritation).   Rehab Potential Good   Clinical Impairments Affecting Rehab Potential active cancer   PT Frequency 2x / week   PT Duration 12 weeks   PT Treatment/Interventions Manual techniques;Manual lymph drainage   PT Next Visit Plan Continue manual lymph drainage in left sidelying to posterior interaxillary anastomosis and right axillo-inguinal anastomosis; also in supine for right upper outer breast; soft tissue work prn right medial thigh.   Consulted and Agree with Plan of Care Patient      Patient will benefit from skilled therapeutic intervention in order to improve the following deficits and impairments:  Pain, Increased muscle spasms, Difficulty walking, Increased edema  Visit Diagnosis: Lymphedema, not elsewhere classified  Pain in right thigh     Problem List Patient Active Problem List   Diagnosis Date Noted  . 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 Zoster 04/04/2015  . Nausea with vomiting 11/18/2014  . Constipation 11/18/2014  . Left-sided thoracic back pain   . Bone metastases ([place]) 11/16/2014  . Back pain 11/15/2014  . Uncontrolled pain 11/14/2014  . 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 Chest wall pain 03/21/2014  .  Abnormal LFTs (liver function tests) 09/12/2013  . Breast cancer of upper-inner quadrant of right female breast ([place]) 08/18/2013  . 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

## 2015-09-25 LAB — CANCER ANTIGEN 27.29: CA 27.29: 1571 U/mL — ABNORMAL HIGH (ref 0.0–38.6)

## 2015-09-26 ENCOUNTER — Ambulatory Visit (HOSPITAL_BASED_OUTPATIENT_CLINIC_OR_DEPARTMENT_OTHER): Payer: 59

## 2015-09-26 VITALS — BP 143/86 | HR 80 | Temp 97.4°F | Resp 20

## 2015-09-26 DIAGNOSIS — C50211 Malignant neoplasm of upper-inner quadrant of right female breast: Secondary | ICD-10-CM | POA: Diagnosis not present

## 2015-09-26 DIAGNOSIS — C771 Secondary and unspecified malignant neoplasm of intrathoracic lymph nodes: Secondary | ICD-10-CM

## 2015-09-26 DIAGNOSIS — Z5111 Encounter for antineoplastic chemotherapy: Secondary | ICD-10-CM | POA: Diagnosis not present

## 2015-09-26 MED ORDER — DENOSUMAB 120 MG/1.7ML ~~LOC~~ SOLN
120.0000 mg | Freq: Once | SUBCUTANEOUS | Status: DC
  Filled 2015-09-26: qty 1.7

## 2015-09-26 MED ORDER — FULVESTRANT 250 MG/5ML IM SOLN
500.0000 mg | Freq: Once | INTRAMUSCULAR | Status: AC
  Administered 2015-09-26: 500 mg via INTRAMUSCULAR
  Filled 2015-09-26: qty 10

## 2015-09-26 NOTE — Patient Instructions (Signed)
Denosumab injection What is this medicine? DENOSUMAB (den oh sue mab) slows bone breakdown. Prolia is used to treat osteoporosis in women after menopause and in men. Xgeva is used to prevent bone fractures and other bone problems caused by cancer bone metastases. Xgeva is also used to treat giant cell tumor of the bone. This medicine may be used for other purposes; ask your health care provider or pharmacist if you have questions. What should I tell my health care provider before I take this medicine? They need to know if you have any of these conditions: -dental disease -eczema -infection or history of infections -kidney disease or on dialysis -low blood calcium or vitamin D -malabsorption syndrome -scheduled to have surgery or tooth extraction -taking medicine that contains denosumab -thyroid or parathyroid disease -an unusual reaction to denosumab, other medicines, foods, dyes, or preservatives -pregnant or trying to get pregnant -breast-feeding How should I use this medicine? This medicine is for injection under the skin. It is given by a health care professional in a hospital or clinic setting. If you are getting Prolia, a special MedGuide will be given to you by the pharmacist with each prescription and refill. Be sure to read this information carefully each time. For Prolia, talk to your pediatrician regarding the use of this medicine in children. Special care may be needed. For Xgeva, talk to your pediatrician regarding the use of this medicine in children. While this drug may be prescribed for children as young as 13 years for selected conditions, precautions do apply. Overdosage: If you think you have taken too much of this medicine contact a poison control center or emergency room at once. NOTE: This medicine is only for you. Do not share this medicine with others. What if I miss a dose? It is important not to miss your dose. Call your doctor or health care professional if you are  unable to keep an appointment. What may interact with this medicine? Do not take this medicine with any of the following medications: -other medicines containing denosumab This medicine may also interact with the following medications: -medicines that suppress the immune system -medicines that treat cancer -steroid medicines like prednisone or cortisone This list may not describe all possible interactions. Give your health care provider a list of all the medicines, herbs, non-prescription drugs, or dietary supplements you use. Also tell them if you smoke, drink alcohol, or use illegal drugs. Some items may interact with your medicine. What should I watch for while using this medicine? Visit your doctor or health care professional for regular checks on your progress. Your doctor or health care professional may order blood tests and other tests to see how you are doing. Call your doctor or health care professional if you get a cold or other infection while receiving this medicine. Do not treat yourself. This medicine may decrease your body's ability to fight infection. You should make sure you get enough calcium and vitamin D while you are taking this medicine, unless your doctor tells you not to. Discuss the foods you eat and the vitamins you take with your health care professional. See your dentist regularly. Brush and floss your teeth as directed. Before you have any dental work done, tell your dentist you are receiving this medicine. Do not become pregnant while taking this medicine or for 5 months after stopping it. Women should inform their doctor if they wish to become pregnant or think they might be pregnant. There is a potential for serious side effects   to an unborn child. Talk to your health care professional or pharmacist for more information. What side effects may I notice from receiving this medicine? Side effects that you should report to your doctor or health care professional as soon as  possible: -allergic reactions like skin rash, itching or hives, swelling of the face, lips, or tongue -breathing problems -chest pain -fast, irregular heartbeat -feeling faint or lightheaded, falls -fever, chills, or any other sign of infection -muscle spasms, tightening, or twitches -numbness or tingling -skin blisters or bumps, or is dry, peels, or red -slow healing or unexplained pain in the mouth or jaw -unusual bleeding or bruising Side effects that usually do not require medical attention (Report these to your doctor or health care professional if they continue or are bothersome.): -muscle pain -stomach upset, gas This list may not describe all possible side effects. Call your doctor for medical advice about side effects. You may report side effects to FDA at 1-800-FDA-1088. Where should I keep my medicine? This medicine is only given in a clinic, doctor's office, or other health care setting and will not be stored at home. NOTE: This sheet is a summary. It may not cover all possible information. If you have questions about this medicine, talk to your doctor, pharmacist, or health care provider.    2016, Elsevier/Gold Standard. (2011-09-15 12:37:47) Fulvestrant injection What is this medicine? FULVESTRANT (ful VES trant) blocks the effects of estrogen. It is used to treat breast cancer. This medicine may be used for other purposes; ask your health care provider or pharmacist if you have questions. What should I tell my health care provider before I take this medicine? They need to know if you have any of these conditions: -bleeding problems -liver disease -low levels of platelets in the blood -an unusual or allergic reaction to fulvestrant, other medicines, foods, dyes, or preservatives -pregnant or trying to get pregnant -breast-feeding How should I use this medicine? This medicine is for injection into a muscle. It is usually given by a health care professional in a  hospital or clinic setting. Talk to your pediatrician regarding the use of this medicine in children. Special care may be needed. Overdosage: If you think you have taken too much of this medicine contact a poison control center or emergency room at once. NOTE: This medicine is only for you. Do not share this medicine with others. What if I miss a dose? It is important not to miss your dose. Call your doctor or health care professional if you are unable to keep an appointment. What may interact with this medicine? -medicines that treat or prevent blood clots like warfarin, enoxaparin, and dalteparin This list may not describe all possible interactions. Give your health care provider a list of all the medicines, herbs, non-prescription drugs, or dietary supplements you use. Also tell them if you smoke, drink alcohol, or use illegal drugs. Some items may interact with your medicine. What should I watch for while using this medicine? Your condition will be monitored carefully while you are receiving this medicine. You will need important blood work done while you are taking this medicine. Do not become pregnant while taking this medicine or for at least 1 year after stopping it. Women of child-bearing potential will need to have a negative pregnancy test before starting this medicine. Women should inform their doctor if they wish to become pregnant or think they might be pregnant. There is a potential for serious side effects to an unborn child. Men   should inform their doctors if they wish to father a child. This medicine may lower sperm counts. Talk to your health care professional or pharmacist for more information. Do not breast-feed an infant while taking this medicine or for 1 year after the last dose. What side effects may I notice from receiving this medicine? Side effects that you should report to your doctor or health care professional as soon as possible: -allergic reactions like skin rash,  itching or hives, swelling of the face, lips, or tongue -feeling faint or lightheaded, falls -pain, tingling, numbness, or weakness in the legs -signs and symptoms of infection like fever or chills; cough; flu-like symptoms; sore throat -vaginal bleeding Side effects that usually do not require medical attention (report to your doctor or health care professional if they continue or are bothersome): -aches, pains -constipation -diarrhea -headache -hot flashes -nausea, vomiting -pain at site where injected -stomach pain This list may not describe all possible side effects. Call your doctor for medical advice about side effects. You may report side effects to FDA at 1-800-FDA-1088. Where should I keep my medicine? This drug is given in a hospital or clinic and will not be stored at home. NOTE: This sheet is a summary. It may not cover all possible information. If you have questions about this medicine, talk to your doctor, pharmacist, or health care provider.    2016, Elsevier/Gold Standard. (2014-10-13 11:03:55)  

## 2015-09-28 ENCOUNTER — Ambulatory Visit: Payer: 59 | Admitting: Physical Therapy

## 2015-09-28 DIAGNOSIS — I890 Lymphedema, not elsewhere classified: Secondary | ICD-10-CM

## 2015-09-28 DIAGNOSIS — M79651 Pain in right thigh: Secondary | ICD-10-CM

## 2015-09-28 NOTE — Therapy (Signed)
[place], [place], [address] Phone: [phone]   Fax:  [phone]  Physical Therapy Treatment  Patient Details  Name: [name] [name] MRN: [id number] Date of Birth: 12/17/1960 Referring Provider: Dr. Lurline Del  Encounter Date: 09/28/2015      PT End of Session - 09/28/15 1219    Visit Number 57  50 for lymph   Number of Visits 72  for lymph   Date for PT Re-Evaluation 12/10/15   Authorization - Visit Number 140   PT Start Time [id number]   PT Stop Time 1107   PT Time Calculation (min) 44 min   Activity Tolerance Patient tolerated treatment well   Behavior During Therapy [name] for tasks assessed/performed      Past Medical History  Diagnosis Date  . Seizures ([place]) 2010    Isolated incident.  Marland Kitchen PONV (postoperative nausea and vomiting)   . Peripheral vascular disease ([place]) 02/2010    blood clot related to porta cath  . S/P radiation therapy 07/17/2014 through 08/02/2014     Left mediastinum, left seventh rib 3250 cGy in 13 sessions   . S/P radiation therapy 12/11/2014 through 12/22/2014     Left parietal calvarium 2400 cGy in 8 sessions   . Breast cancer ([place]) dx'd 2005/2011  . Bone metastases ([place]) dx'd 05/2014    Past Surgical History  Procedure Laterality Date  . Breast lumpectomy  2005  . Axillary lymph node dissection  Dec. 2011  . Portacath placement  12/11  . Removal portacath    . Mediastinotomy chamberlain mcneil Left 06/02/2013    Procedure: MEDIASTINOTOMY CHAMBERLAIN MCNEIL;  Surgeon: [name], MD;  Location: [place];  Service: Thoracic;  Laterality: Left;  LEFT ANTERIOR MEDIASTINOTOMY     There were no vitals filed for this visit.      Subjective Assessment - 09/28/15 1027    Subjective Having some new pain  at left posterolateral ribs.   Currently in Pain? Yes   Pain Score 2    Pain Location Groin   Pain Score 6   Pain Location Axilla   Pain Orientation Right   Pain Score 2  shooting pains up to 8   Pain Location Rib cage   Pain Orientation Left   Pain Descriptors / Indicators Other (Comment)  zings   Pain Type Acute pain                         [facility] Adult PT Treatment/Exercise - 09/28/15 0001    Manual Therapy   Myofascial Release right axilla with focus on scar tightness   Manual Lymphatic Drainage (MLD) In left sidelying, posterior interaxillary anastomosis and right axillo-inguinal anastomosis; in supine, short neck, left axilla and anterior interaxillary anastomosis, right groin and axillo-inguinal anastomosis, area between right breast scars, directing toward pathways, and right upper arm.  In right sidelying, left periscapular area toward left groin.   Other Manual Therapy soft tissue work at superior and entire aspect of right medial thigh in right sidelying for pain relief and muscle relaxation, gentle myofascial release                  PT Short Term Goals - 02/27/15 1011    PT SHORT TERM GOAL #1   Title pain with walking decreased >/= 25%   Time 3   Period Weeks   Status Achieved   PT SHORT TERM GOAL #2   Title  pelvis stays in correct alignment for 3 weeks   Time 3   Period Weeks   Status Achieved           PT Long Term Goals - 08/14/15 1317    PT LONG TERM GOAL #1   Title indpendent with HEP   Time 12   Period Weeks   Status On-going   PT LONG TERM GOAL #2   Title pain with walking decreased >/= 80%   Time 12   Period Weeks   Status On-going   PT LONG TERM GOAL #3   Title go up and down steps with a step over step pattern   Time 6   Status Deferred   PT LONG TERM GOAL #4   Title being able to last for 5 days without therapy and pain not going above a 8/10 due to decreased muscle soreness   Time 12   Period Weeks   Status  Achieved           Long Term Clinic Goals - 09/28/15 1223    CC Long Term Goal  #2   Title Pt. will report swelling is adequately managed to enable ADL function at a consistent level.   Status On-going   CC Long Term Goal  #4   Title Pain/discomfort at right axilla area will be controlled at 6/10 or less.   Status On-going   CC Long Term Goal  #5   Title Patient will avoid infection by ongoing management of her lymphedema at right breast/axilla/upper arm areas.   Status On-going            Plan - 09/28/15 1220    Clinical Impression Statement Right upper outer breast and axilla areas even more full today, and patient came in reporting increased left rib pain in the area where she has metastases and has had radiation treatment before (and she is concerned about this).  Right groin/thigh area is doing better, with pain at a lower level even with patient walking without the cane.   Rehab Potential Good   Clinical Impairments Affecting Rehab Potential active cancer   PT Frequency 2x / week   PT Duration 12 weeks   PT Treatment/Interventions Manual techniques;Manual lymph drainage   PT Next Visit Plan Continue treatment for right axillary lymphedema, left rib pain and swelling, and right thigh pain.   Consulted and Agree with Plan of Care Patient      Patient will benefit from skilled therapeutic intervention in order to improve the following deficits and impairments:  Pain, Increased muscle spasms, Difficulty walking, Increased edema  Visit Diagnosis: Lymphedema, not elsewhere classified  Pain in right thigh     Problem List Patient Active Problem List   Diagnosis Date Noted  . 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 Zoster 04/04/2015  . Nausea with vomiting 11/18/2014  . Constipation 11/18/2014  . Left-sided thoracic back pain   . Bone metastases ([place]) 11/16/2014  . Back pain 11/15/2014  . Uncontrolled pain 11/14/2014  . Post-lymphadenectomy lymphedema of  arm 05/31/2014  . Chest wall pain 03/21/2014  . Abnormal LFTs (liver function tests) 09/12/2013  . Breast cancer of upper-inner quadrant of right female breast ([place]) 08/18/2013  . 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

## 2015-10-01 ENCOUNTER — Other Ambulatory Visit (HOSPITAL_BASED_OUTPATIENT_CLINIC_OR_DEPARTMENT_OTHER): Payer: 59

## 2015-10-01 ENCOUNTER — Ambulatory Visit: Payer: 59 | Attending: Oncology | Admitting: Physical Therapy

## 2015-10-01 ENCOUNTER — Other Ambulatory Visit: Payer: Self-pay | Admitting: *Deleted

## 2015-10-01 DIAGNOSIS — C50211 Malignant neoplasm of upper-inner quadrant of right female breast: Secondary | ICD-10-CM

## 2015-10-01 DIAGNOSIS — C771 Secondary and unspecified malignant neoplasm of intrathoracic lymph nodes: Secondary | ICD-10-CM

## 2015-10-01 DIAGNOSIS — M79651 Pain in right thigh: Secondary | ICD-10-CM | POA: Diagnosis present

## 2015-10-01 DIAGNOSIS — M62838 Other muscle spasm: Secondary | ICD-10-CM | POA: Diagnosis present

## 2015-10-01 DIAGNOSIS — J90 Pleural effusion, not elsewhere classified: Secondary | ICD-10-CM

## 2015-10-01 DIAGNOSIS — I890 Lymphedema, not elsewhere classified: Secondary | ICD-10-CM | POA: Insufficient documentation

## 2015-10-01 LAB — CBC WITH DIFFERENTIAL/PLATELET
BASO%: 1.4 % (ref 0.0–2.0)
BASOS ABS: 0 10*3/uL (ref 0.0–0.1)
EOS ABS: 0.1 10*3/uL (ref 0.0–0.5)
EOS%: 1.7 % (ref 0.0–7.0)
HCT: 33.3 % — ABNORMAL LOW (ref 34.8–46.6)
HEMOGLOBIN: 12 g/dL (ref 11.6–15.9)
LYMPH#: 0.8 10*3/uL — AB (ref 0.9–3.3)
LYMPH%: 26 % (ref 14.0–49.7)
MCH: 35.3 pg — AB (ref 25.1–34.0)
MCHC: 36 g/dL (ref 31.5–36.0)
MCV: 97.9 fL (ref 79.5–101.0)
MONO#: 0.2 10*3/uL (ref 0.1–0.9)
MONO%: 8.3 % (ref 0.0–14.0)
NEUT#: 1.8 10*3/uL (ref 1.5–6.5)
NEUT%: 62.6 % (ref 38.4–76.8)
NRBC: 0 % (ref 0–0)
PLATELETS: 213 10*3/uL (ref 145–400)
RBC: 3.4 10*6/uL — ABNORMAL LOW (ref 3.70–5.45)
RDW: 13.8 % (ref 11.2–14.5)
WBC: 2.9 10*3/uL — ABNORMAL LOW (ref 3.9–10.3)

## 2015-10-01 NOTE — Therapy (Signed)
[place], [place], [address] Phone: [phone]   Fax:  [phone]  Physical Therapy Treatment  Patient Details  Name: [name] [name] MRN: [id number] Date of Birth: 1960-12-12 Referring Provider: Dr. Lurline Del  Encounter Date: 10/01/2015      PT End of Session - 10/01/15 1807    Visit Number 42  51 for lymph   Number of Visits 72  for lymph   Date for PT Re-Evaluation 12/10/15   Authorization - Visit Number 140   PT Start Time [id number]   PT Stop Time 1101   PT Time Calculation (min) 38 min   Activity Tolerance Patient tolerated treatment well   Behavior During Therapy [facility] for tasks assessed/performed      Past Medical History  Diagnosis Date  . Seizures ([place]) 2010    Isolated incident.  Marland Kitchen PONV (postoperative nausea and vomiting)   . Peripheral vascular disease ([place]) 02/2010    blood clot related to porta cath  . S/P radiation therapy 07/17/2014 through 08/02/2014     Left mediastinum, left seventh rib 3250 cGy in 13 sessions   . S/P radiation therapy 12/11/2014 through 12/22/2014     Left parietal calvarium 2400 cGy in 8 sessions   . Breast cancer ([place]) dx'd 2005/2011  . Bone metastases ([place]) dx'd 05/2014    Past Surgical History  Procedure Laterality Date  . Breast lumpectomy  2005  . Axillary lymph node dissection  Dec. 2011  . Portacath placement  12/11  . Removal portacath    . Mediastinotomy chamberlain mcneil Left 06/02/2013    Procedure: MEDIASTINOTOMY CHAMBERLAIN MCNEIL;  Surgeon: [name], MD;  Location: [place];  Service: Thoracic;  Laterality: Left;  LEFT ANTERIOR MEDIASTINOTOMY     There were no vitals filed for this visit.      Subjective Assessment - 10/01/15 1025    Currently in Pain? Yes   Pain  Score 3    Pain Location Groin   Pain Orientation Right   Pain Score 6   Pain Location Axilla   Pain Orientation Right   Pain Score 0  was more yesterday   Pain Location Rib cage   Pain Orientation Left   Pain Descriptors / Indicators Pressure                         [facility] Adult PT Treatment/Exercise - 10/01/15 0001    Manual Therapy   Myofascial Release right axilla with focus on scar tightness   Manual Lymphatic Drainage (MLD) In left sidelying, posterior interaxillary anastomosis and right axillo-inguinal anastomosis; in supine, short neck, left axilla and anterior interaxillary anastomosis, right groin and axillo-inguinal anastomosis, area between right breast scars, directing toward pathways, and right upper arm.  In right sidelying, left periscapular area toward left groin.   Other Manual Therapy soft tissue work at superior and entire aspect of right medial thigh in right sidelying for pain relief and muscle relaxation, gentle myofascial release                  PT Short Term Goals - 02/27/15 1011    PT SHORT TERM GOAL #1   Title pain with walking decreased >/= 25%   Time 3   Period Weeks   Status Achieved   PT SHORT TERM GOAL #2   Title pelvis stays in correct alignment for 3 weeks   Time 3   Period Weeks  Status Achieved           PT Long Term Goals - 08/14/15 1317    PT LONG TERM GOAL #1   Title indpendent with HEP   Time 12   Period Weeks   Status On-going   PT LONG TERM GOAL #2   Title pain with walking decreased >/= 80%   Time 12   Period Weeks   Status On-going   PT LONG TERM GOAL #3   Title go up and down steps with a step over step pattern   Time 6   Status Deferred   PT LONG TERM GOAL #4   Title being able to last for 5 days without therapy and pain not going above a 8/10 due to decreased muscle soreness   Time 12   Period Weeks   Status Achieved           Long Term Clinic Goals - 09/28/15 1223    CC Long  Term Goal  #2   Title Pt. will report swelling is adequately managed to enable ADL function at a consistent level.   Status On-going   CC Long Term Goal  #4   Title Pain/discomfort at right axilla area will be controlled at 6/10 or less.   Status On-going   CC Long Term Goal  #5   Title Patient will avoid infection by ongoing management of her lymphedema at right breast/axilla/upper arm areas.   Status On-going            Plan - 10/01/15 1807    Clinical Impression Statement Continuing to benefit from therapy for health maintenance and avoidance of infection and potential hospitalization as well as for improved function and quality of life.   Rehab Potential Good   Clinical Impairments Affecting Rehab Potential active cancer   PT Frequency 2x / week   PT Duration 12 weeks   PT Treatment/Interventions Manual techniques;Manual lymph drainage   PT Next Visit Plan Continue treatment for right axillary lymphedema, left rib pain and swelling, and right thigh pain.   Consulted and Agree with Plan of Care Patient      Patient will benefit from skilled therapeutic intervention in order to improve the following deficits and impairments:  Pain, Increased muscle spasms, Difficulty walking, Increased edema  Visit Diagnosis: Lymphedema, not elsewhere classified  Pain in right thigh     Problem List Patient Active Problem List   Diagnosis Date Noted  . 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 Zoster 04/04/2015  . Nausea with vomiting 11/18/2014  . Constipation 11/18/2014  . Left-sided thoracic back pain   . Bone metastases ([place]) 11/16/2014  . Back pain 11/15/2014  . Uncontrolled pain 11/14/2014  . 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 Chest wall pain 03/21/2014  . Abnormal LFTs (liver function tests) 09/12/2013  . Breast cancer of upper-inner quadrant of right female breast ([place]) 08/18/2013  . 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

## 2015-10-05 ENCOUNTER — Ambulatory Visit: Payer: 59 | Admitting: Physical Therapy

## 2015-10-05 ENCOUNTER — Telehealth: Payer: Self-pay | Admitting: Oncology

## 2015-10-05 ENCOUNTER — Ambulatory Visit (HOSPITAL_BASED_OUTPATIENT_CLINIC_OR_DEPARTMENT_OTHER): Payer: 59 | Admitting: Oncology

## 2015-10-05 VITALS — Ht 64.0 in

## 2015-10-05 DIAGNOSIS — C771 Secondary and unspecified malignant neoplasm of intrathoracic lymph nodes: Secondary | ICD-10-CM | POA: Diagnosis not present

## 2015-10-05 DIAGNOSIS — G893 Neoplasm related pain (acute) (chronic): Secondary | ICD-10-CM | POA: Diagnosis not present

## 2015-10-05 DIAGNOSIS — I890 Lymphedema, not elsewhere classified: Secondary | ICD-10-CM

## 2015-10-05 DIAGNOSIS — R530 Neoplastic (malignant) related fatigue: Secondary | ICD-10-CM

## 2015-10-05 DIAGNOSIS — C7951 Secondary malignant neoplasm of bone: Secondary | ICD-10-CM | POA: Diagnosis not present

## 2015-10-05 DIAGNOSIS — M79651 Pain in right thigh: Secondary | ICD-10-CM

## 2015-10-05 DIAGNOSIS — C50211 Malignant neoplasm of upper-inner quadrant of right female breast: Secondary | ICD-10-CM | POA: Diagnosis not present

## 2015-10-05 NOTE — Progress Notes (Signed)
[place]  [phone]) [phone] Fax:(336) [phone]     ID: [name] [name] OB: October 19, 1960  MR#: [id number]  ZMO#:[id number]  PCP: Pcp Not In System GYN:  [name] [name] SU:  OTHER MD: [name] [name], [name] [name], [name] [name], [name] [name], [name] [name]  CHIEF COMPLAINT: Stage IV breast cancer  CURRENT TREATMENT: Fulvestrant, denosumab, palbociclib  BREAST CANCER HISTORY: From doctor [name] [name]'s intake note 03/20/2004:  "The patient is a very pleasant 55 year old female, without significant past medical history.  Her family history is significant for a sister who at age 40 was diagnosed with invasive ductal carcinoma.  She is a breast cancer survivor at age 22 now.  The patient states that she has never really had a screening mammogram until October 2005, when she felt that it was time for her to start having mammograms done on a yearly basis.  Therefore, on 01/26/04, she underwent a screening mammogram and an abnormality was detected in the upper outer right breast.  She, therefore, underwent spot compression views of both the right and the left breast.  The left breast revealed a well-defined mass in the upper outer left quadrant, present at the 2 o'clock position, measuring 1.8 cm, 6 cm from the nipple.  This, by ultrasound, was felt to be a simple cyst measuring 1.8 cm.  On the right breast, a spiculated mass was noted in the upper outer right quadrant.  The ultrasound revealed a shadowing irregular solid mass at the 10:30 position, 9 cm from the nipple, measuring 1.2 cm in greatest dimension, correlating with the spiculated mass seen on the mammogram.  The right axilla was negative ultrasonically.  Because of this, the patient underwent a needle biopsy of the right breast and the biopsy was positive invasive mammary carcinoma that showed features consistent with a high-grade invasive ductal carcinoma associated with desmoplastic stroma.  No in  situ component was seen and no definite lymphovascular invasion was identified.  On the core biopsy, the tumor measured about 0.8 cm.  Because of this, she was seen by Dr. [name] and the patient was taken to the [facility] on March 15, 2004.  She underwent a right breast lumpectomy with sentinel node biopsy.  The final pathology revealed an invasive ductal carcinoma, measuring 1.7 cm, grade 2 of 3.  Margins were free of tumor.  Atypical lobular hyperplasia was noted.  One sentinel node was removed which was negative for metastatic disease.  The tumor was staged at T1c, N0 MX.  It was estrogen receptor positive, progesterone receptor positive.  HER-2/neu was 2+.  FISH was negative.  All margins were free of tumor.  She is now seen in Medical Oncology for further evaluation and management of this newly diagnosed T1c, node negative, stage I, invasive ductal carcinoma of the right breast."  Her subsequent history is as detailed below  INTERVAL HISTORY: [name] [name] returns today for follow-up of her stage IV estrogen receptor positive breast cancer, accompanied by her husband [name] [name]. She continues on monthly fulvestrant, with her most recent dose 09/26/2015. She is having more pain with the injections--we have a new nurse doing it and she tells me "there is a real difference". She is also on palbociclib--we have gradually increased her dose to the current 75 mg daily for 21 days on, 7 days off. She just completed her first cycle at this dose, today being day 27 of what we will call cycle 1 of full dose palbociclib. Finally she also receives denosumab  monthly. She tolerates that well.   REVIEW OF SYiSTEMS: [name] [name] has been having more pain in the upper left posterior chest wall area, and she vertically wanted to review the PET scan in that area as well as in the pelvis. She has had a little bit of a dry cough associated with esophageal discomfort. She wonders if this could be related to her prior radiation, but it  sounds to me very much like reflux issues. Bowel movements remain very irregular [name] [name] or family in"). The pain in the groin is better and she is walking a bit more but she tires easily. The pain in the back portion of the pelvis is improved. She is generally tolerating her treatment well but is already very anxious about what our regrowing to do next if the current treatment does not work. Note that we did check her tumor for evidence of microsatellite instability and MMR appeared to be normal. A detailed review of systems today was otherwise stable  PAST MEDICAL HISTORY: Past Medical History  Diagnosis Date  . Seizures ([place]) 2010    Isolated incident.  Marland Kitchen PONV (postoperative nausea and vomiting)   . Peripheral vascular disease ([place]) 02/2010    blood clot related to porta cath  . S/P radiation therapy 07/17/2014 through 08/02/2014     Left mediastinum, left seventh rib 3250 cGy in 13 sessions   . S/P radiation therapy 12/11/2014 through 12/22/2014     Left parietal calvarium 2400 cGy in 8 sessions   . Breast cancer ([place]) dx'd 2005/2011  . Bone metastases ([place]) dx'd 05/2014    PAST SURGICAL HISTORY: Past Surgical History  Procedure Laterality Date  . Breast lumpectomy  2005  . Axillary lymph node dissection  Dec. 2011  . Portacath placement  12/11  . Removal portacath    . Mediastinotomy chamberlain mcneil Left 06/02/2013    Procedure: MEDIASTINOTOMY CHAMBERLAIN MCNEIL;  Surgeon: [name] Nakayama, MD;  Location: [facility] OR;  Service: Thoracic;  Laterality: Left;  LEFT ANTERIOR MEDIASTINOTOMY     FAMILY HISTORY Family History  Problem Relation Age of Onset  . COPD Mother   . Breast cancer Sister 78   The patient's father is living, 87 years old as of may 2015. He lives in [place]. The patient's mother died from complications of COPD  at the age of 76. These has 2 brothers, one sister. Her sister developed breast cancer at the age of 17. She is doing well. The patient herself underwent genetic testing at [facility] in 2011 and was found to be BRCA negative  GYNECOLOGIC HISTORY:  Menarche age 20, she is GX P0. She stopped having periods with her initial chemotherapy in 2006.  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 Her husband [name] is a Medical illustrator in [place]. He has a child from a prior marriage. At home they have 2 rescue dogs, Hobo and [place]. The patient is religious but not a church attender    ADVANCED DIRECTIVES: In place; at the 08/04/2014 visit in particular the patient was very clear, with her husband present, that she would not want any kind of feeding tubes or "other tubes" if her condition deteriorated.   HEALTH MAINTENANCE: Social History  Substance Use Topics  . Smoking status: Never Smoker   . Smokeless tobacco: Never Used  . Alcohol Use: No     Colonoscopy:  PAP:  Bone density: March 2015; mild osteopenia  Lipid panel:  Allergies  Allergen Reactions  . 2nd Skin Quick Heal Other (See Comments)    Other Reaction: Skin peels  . Decadron [Dexamethasone] Other (See Comments)    Patient does not tolerate steroids.   . Dilaudid [Hydromorphone] Nausea And Vomiting  . Enoxaparin Other (See Comments)    unknown  . Fluconazole Swelling    Liver toxicity  . Hydromorphone Hcl Nausea And Vomiting  . Morphine And Related Nausea And Vomiting  . Protonix [Pantoprazole Sodium] Other (See Comments)    Patient reports it caused thrush.  . Tegaderm Ag Mesh [Silver]     Current Outpatient Prescriptions  Medication Sig Dispense Refill  . ALPRAZolam (XANAX) 0.5 MG tablet Take 1 tablet (0.5 mg total) by mouth 2 (two) times daily as needed for anxiety. 30 tablet 0  . B Complex-C (B-COMPLEX WITH VITAMIN C) tablet Take 1 tablet by mouth  daily. Reported on 03/27/2015    . calcium carbonate (TUMS - DOSED IN MG ELEMENTAL CALCIUM) 500 MG chewable tablet Chew 1 tablet by mouth as directed.    . cholecalciferol 2000 UNITS tablet Take 1 tablet (2,000 Units total) by mouth daily.    . Diphenhyd-Hydrocort-Nystatin (FIRST-DUKES MOUTHWASH) SUSP 5-10 ml qid SWISH AND SPIT 536 mL 3  . folic acid (FOLVITE) 1 MG tablet Take 1 tablet (1 mg total) by mouth daily.    . Melatonin 3 MG TABS Take 3 mg by mouth at bedtime.    . naproxen sodium (ANAPROX) 220 MG tablet Take 220 mg by mouth 2 (two) times daily with a meal.    . palbociclib (IBRANCE) 75 MG capsule Take 1 capsule (75 mg total) by mouth daily with breakfast. Take whole with food. 21 capsule 6  . saccharomyces boulardii (FLORASTOR) 250 MG capsule Take 250 mg by mouth daily.      No current facility-administered medications for this visit.    OBJECTIVE: Middle-aged white woman In no acute distress Filed Vitals:     There is no weight on file to calculate BMI.   Filed Vitals:     Patient refused vitals today 10/07/2015     ECOG FS: 1  Sclerae unicteric, EOMs intact Oropharynx clear and moist No cervical or supraclavicular adenopathy Lungs no rales or rhonchi Heart regular rate and rhythm Abd soft, nontender, positive bowel sounds MSK no focal spinal tenderness, no upper extremity lymphedema; no tenderness to palpation in the upper left back area Neuro: nonfocal, well oriented, appropriate affect Breasts: Deferred     LAB RESULTS:   CMP     Component Value Date/Time   NA 135* 09/24/2015 1152   NA 140 12/14/2014 0800   K 4.2 09/24/2015 1152   K 3.6 12/14/2014 0800   CL 103 12/14/2014 0800   CL 105 05/06/2012 1333   CO2 28 09/24/2015 1152   CO2 28 12/14/2014 0800   GLUCOSE 82 09/24/2015 1152   GLUCOSE 100* 12/14/2014 0800   GLUCOSE 124* 05/06/2012 1333   BUN 10.8 09/24/2015 1152   BUN 6 12/14/2014 0800   CREATININE 0.9 09/24/2015 1152   CREATININE 0.64  12/14/2014 0800   CALCIUM 9.6 09/24/2015 1152   CALCIUM 9.6 12/14/2014 0800   PROT 7.3 09/24/2015 1152   PROT 5.6* 11/20/2014 0433   ALBUMIN 4.0 09/24/2015 1152   ALBUMIN 2.6* 11/20/2014 0433   AST 24 09/24/2015 1152   AST 28 11/20/2014 0433   ALT 33 09/24/2015 1152   ALT 62* 11/20/2014 0433   ALKPHOS 54 09/24/2015 1152  ALKPHOS 105 11/20/2014 0433   BILITOT 0.36 09/24/2015 1152   BILITOT 0.7 11/20/2014 0433   GFRNONAA >60 12/14/2014 0800   GFRAA >60 12/14/2014 0800    No results found for: SPEP  Lab Results  Component Value Date   WBC 2.9* 10/01/2015   NEUTROABS 1.8 10/01/2015   HGB 12.0 10/01/2015   HCT 33.3* 10/01/2015   MCV 97.9 10/01/2015   PLT 213 10/01/2015      Chemistry      Component Value Date/Time   NA 135* 09/24/2015 1152   NA 140 12/14/2014 0800   K 4.2 09/24/2015 1152   K 3.6 12/14/2014 0800   CL 103 12/14/2014 0800   CL 105 05/06/2012 1333   CO2 28 09/24/2015 1152   CO2 28 12/14/2014 0800   BUN 10.8 09/24/2015 1152   BUN 6 12/14/2014 0800   CREATININE 0.9 09/24/2015 1152   CREATININE 0.64 12/14/2014 0800      Component Value Date/Time   CALCIUM 9.6 09/24/2015 1152   CALCIUM 9.6 12/14/2014 0800   ALKPHOS 54 09/24/2015 1152   ALKPHOS 105 11/20/2014 0433   AST 24 09/24/2015 1152   AST 28 11/20/2014 0433   ALT 33 09/24/2015 1152   ALT 62* 11/20/2014 0433   BILITOT 0.36 09/24/2015 1152   BILITOT 0.7 11/20/2014 0433     Results for TABYTHA, GRADILLAS (MRN [id number]) as of 10/05/2015 09:32  Ref. Range 06/11/2015 11:38 07/09/2015 11:52 07/30/2015 11:38 08/28/2015 11:53 09/24/2015 11:52  CA 27.29 Latest Ref Range: 0.0-38.6 U/mL 1,773.7 (H) 1,635.9 (H) 1,720.6 (H) 1,679.3 (H) 1,571.0 (H)  Results for CHIANN, GOFFREDO (MRN [id number]) as of 10/05/2015 09:32  Ref. Range 09/03/2015 11:43 09/10/2015 11:48 09/17/2015 11:31 09/24/2015 11:52 10/01/2015 11:35  NEUT# Latest Ref Range: 1.5-6.5 10e3/uL 2.7 2.1 2.2 1.9 1.8    No components found for: LABCA125  No  results for input(s): INR in the last 168 hours.  Urinalysis    Component Value Date/Time   COLORURINE YELLOW 11/17/2014 0143   APPEARANCEUR CLOUDY* 11/17/2014 0143   LABSPEC 1.010 11/17/2014 0143   LABSPEC 1.005 09/12/2013 1542   PHURINE 6.5 11/17/2014 0143   PHURINE 6.0 09/12/2013 1542   GLUCOSEU NEGATIVE 11/17/2014 0143   GLUCOSEU Negative 09/12/2013 1542   HGBUR NEGATIVE 11/17/2014 0143   HGBUR Negative 09/12/2013 1542   BILIRUBINUR NEGATIVE 11/17/2014 0143   BILIRUBINUR Negative 09/12/2013 1542   KETONESUR NEGATIVE 11/17/2014 0143   KETONESUR Negative 09/12/2013 1542   PROTEINUR NEGATIVE 11/17/2014 0143   PROTEINUR Negative 09/12/2013 1542   UROBILINOGEN 0.2 11/17/2014 0143   UROBILINOGEN 0.2 09/12/2013 1542   NITRITE NEGATIVE 11/17/2014 0143   NITRITE Negative 09/12/2013 1542   LEUKOCYTESUR NEGATIVE 11/17/2014 0143   LEUKOCYTESUR Negative 09/12/2013 1542    STUDIES: No results found.  ASSESSMENT: 55 y.o. [place] woman with stage IV breast cancer, history as follows  (1)  S/p Right upper inner quadrant lumpectomy and sentinel lymph node sampling 03/15/2004 for a pT1c pN0. Stage IA invasive ductal carcinoma, grade 2, estrogen receptor 95% positive, progesterone receptor 65% positive, HER-2 not amplified; additional surgery 04/25/2004 for seroma or clearance showed no residual tumor  (2) adjuvant chemotherapy with cyclophosphamide and doxorubicin every 21 days x4 completed 07/19/2004  (3) adjuvant radiation given under Dr. Donella Stade in [place] completed July 2006  (4) the patient opted against adjuvant antiestrogen therapy  (5) genetics testing showed no BRCA mutations  (6) biopsy of a palpable right axillary mass 10/24/2009 showed invasive ductal carcinoma, grade 3, estrogen  receptor 100% positive, progesterone receptor 2% positive (alert score 5) HER-2 negative; no evidence of systemic disease on PET scanning  (7) completed 3 of 4 planned cycles of docetaxel and  cyclophosphamide September 2011, fourth cycle omitted because of marked elevations in liver function tests  (8) an right axillary lymph node dissection 03/06/2010 showed 3/8 lymph nodes removed to be involved by tumor, with extracapsular extension.  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, TTS-1 negative), estrogen receptor 80% positive, progesterone receptor 2% positive, HER-2 not amplified  (12) letrozole started March 2015-- tolerated with significant side effects, discontinued at the end of May 2015  (13) PET scan 08/16/2013 shows extensive left pleural metastatic disease and a large left pleural effusion that shifts cardiac and mediastinal structures to the right; adenopathy (celiac trunk, periadrenal, periaortic); and a left medial clavicular lesion; Status post left thoracentesis 08/16/2013 positive for adenocarcinoma, estrogen receptor positive, progesterone receptor negative.  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 treated with Lovenox for 5-6 months  (c) right upper extremity lymphedema--receiving physical therapy  (d) hepatic steatosis with chronically elevated LFTs as well as unusual hepatic sensitivity to chemotherapy  (e) osteopenia with the lowest T score -1.6 on bone density scan 06/20/2013  (f) radiation oncology (Dr Valere Dross) has reviewed prior radiation records in case there is further mediastinal involvement with dysphagia etc in which case palliative XRT could be considered  (a) radiation to left mediastinum/ left 7th rib 3250 cGy in 13 sessions04/18/2016 through 08/02/2014  (b) radiation to T11 area: 22 Gy in 7 sessions, last dose 11/27/2014  (c) radiation left parietal scalp region to be completed 12/22/2014  (d) radiation to sacral area completed 04/09/2015  (g) chest wall and perineal pain--improved post radiation treatments  (a) discussed celebrex/ carafate but holding off for now  (h) zoster diagnosed 04/04/2015-- on valacyclovir   PLAN: I spent approximately 55  minutes today  with Kaleia and [name] [name] going over her situation. She was very concerned about pain in the rib cage on the left flank posteriorly. We reviewed the PET scan very closely in that area. There is a lot of disease in the left lung and a little bit of disease in the rib cage. I really think there is nothing new in that area. Palpation was negative so I suspect is more the internal then a rib problem.  We also reviewed the pelvis on the PET scan very carefully. I think there is definite improvement in that area.  She has done I think quite well with her first cycle of full dose palbociclib area she has felt more fatigued. This is very discouraging to her and she was tearful during today's meeting. However her counts have held up very well and her liver function tests remain normal. We are pushing right on to a second cycle at the same dose, beginning this weekend.  I think a dry cough and esophageal discomfort she is experiencing is going to be clearly due to reflux. The best thing for that would be for her to take omeprazole. She does have it available but is very reluctant to take any medication. She does not like that or if she tries any doesn't sufficiently work he can also try metoclopramide. Again she has those available but is very reluctant to use them at this point.  As far as the fatigue is concerned we discussed low-dose prednisone and methylphenidate. She is very much against those drugs but we can consider them further in the future if the need arises.   She wanted me to contact Dr. [name] at [facility] regarding Shon Baton. I have gone ahead and done that, but actually I feel Gerldine is doing quite well on her current treatment. Her worry of course is what to do if and when the current treatment stops working. Given her poor tolerance of medications in the past that really is a concern.  For now we are continuing the fulvestrant monthly, although she has some complaints about the particular nurse who is  currently dosing it. She had quite a bit of pain with the last set of shots. We also went ahead and set her up for PET scan and MRI of the liver late August by which time she will have completed 3 cycles of full dose palbociclib with the fulvestrant. I'm hoping to see evidence of at least good control if not measurable response.  I did not discuss Remeron today but that is also something we could try if she has problems maintaining her weight or has continuing issues with labile affect.  She is due for her next fulvestrant third week in July. She is going to see me again in August as scheduled.   Chauncey Cruel, MD   10/07/2015 9:48 AM

## 2015-10-05 NOTE — Therapy (Signed)
[name] [name], [name] [name], [address] Phone: [phone]   Fax:  [phone]  Physical Therapy Treatment  Patient Details  Name: [name] [name] MRN: [id number] Date of Birth: 1960-08-13 Referring Provider: Dr. Lurline Del  Encounter Date: 10/05/2015      PT End of Session - 10/05/15 1231    Visit Number 73  52 for lymph   Number of Visits 72  for lymph   Date for PT Re-Evaluation 12/10/15   Authorization - Visit Number 140   PT Start Time 1115   PT Stop Time 1158   PT Time Calculation (min) 43 min   Activity Tolerance Patient tolerated treatment well   Behavior During Therapy [facility] for tasks assessed/performed      Past Medical History  Diagnosis Date  . Seizures ([place]) 2010    Isolated incident.  Marland Kitchen PONV (postoperative nausea and vomiting)   . Peripheral vascular disease ([place]) 02/2010    blood clot related to porta cath  . S/P radiation therapy 07/17/2014 through 08/02/2014     Left mediastinum, left seventh rib 3250 cGy in 13 sessions   . S/P radiation therapy 12/11/2014 through 12/22/2014     Left parietal calvarium 2400 cGy in 8 sessions   . Breast cancer ([place]) dx'd 2005/2011  . Bone metastases ([place]) dx'd 05/2014    Past Surgical History  Procedure Laterality Date  . Breast lumpectomy  2005  . Axillary lymph node dissection  Dec. 2011  . Portacath placement  12/11  . Removal portacath    . Mediastinotomy chamberlain mcneil Left 06/02/2013    Procedure: MEDIASTINOTOMY CHAMBERLAIN MCNEIL;  Surgeon: [name], MD;  Location: [facility];  Service: Thoracic;  Laterality: Left;  LEFT ANTERIOR MEDIASTINOTOMY     There were no vitals filed for this visit.      Subjective Assessment - 10/05/15 1230    Subjective Saw Dr. Jana Hakim and  looked at the PET together.  There is some activity at the left rib that explains that pain.   Currently in Pain? Yes   Pain Score 3    Pain Location Groin   Pain Orientation Right   Pain Score 6   Pain Location Axilla   Pain Orientation Right                         [facility] Adult PT Treatment/Exercise - 10/05/15 0001    Manual Therapy   Myofascial Release right axilla with focus on scar tightness   Manual Lymphatic Drainage (MLD) In left sidelying, posterior interaxillary anastomosis and right axillo-inguinal anastomosis; in supine, short neck, left axilla and anterior interaxillary anastomosis, right groin and axillo-inguinal anastomosis, area between right breast scars, directing toward pathways, and right upper arm.  In right sidelying, left periscapular area toward left groin.   Other Manual Therapy soft tissue work at superior and entire aspect of right medial thigh in right sidelying for pain relief and muscle relaxation, gentle myofascial release                  PT Short Term Goals - 02/27/15 1011    PT SHORT TERM GOAL #1   Title pain with walking decreased >/= 25%   Time 3   Period Weeks   Status Achieved   PT SHORT TERM GOAL #2   Title pelvis stays in correct alignment for 3 weeks   Time 3   Period Weeks   Status  Achieved           PT Long Term Goals - 08/14/15 1317    PT LONG TERM GOAL #1   Title indpendent with HEP   Time 12   Period Weeks   Status On-going   PT LONG TERM GOAL #2   Title pain with walking decreased >/= 80%   Time 12   Period Weeks   Status On-going   PT LONG TERM GOAL #3   Title go up and down steps with a step over step pattern   Time 6   Status Deferred   PT LONG TERM GOAL #4   Title being able to last for 5 days without therapy and pain not going above a 8/10 due to decreased muscle soreness   Time 12   Period Weeks   Status Achieved           Long Term Clinic Goals - 10/05/15 1234    CC Long Term  Goal  #2   Title Pt. will report swelling is adequately managed to enable ADL function at a consistent level.   Status On-going   CC Long Term Goal  #4   Title Pain/discomfort at right axilla area will be controlled at 6/10 or less.   Status On-going   CC Long Term Goal  #5   Title Patient will avoid infection by ongoing management of her lymphedema at right breast/axilla/upper arm areas.   Status On-going            Plan - 10/05/15 1231    Clinical Impression Statement Right upper outer breast is indurated when patient comes in for therapy, and softens with manual lymph drainage; patient also reports right thigh pain reduction with soft tissue work to that area.  She wants to pursue acupuncture for the left rib pain so is looking into that.    Rehab Potential Good   Clinical Impairments Affecting Rehab Potential active cancer   PT Frequency 2x / week   PT Duration 12 weeks   PT Treatment/Interventions Manual techniques;Manual lymph drainage   PT Next Visit Plan Continue treatment for right axillary lymphedema, left rib pain and swelling, and right thigh pain.   Consulted and Agree with Plan of Care Patient      Patient will benefit from skilled therapeutic intervention in order to improve the following deficits and impairments:  Pain, Increased muscle spasms, Difficulty walking, Increased edema  Visit Diagnosis: Lymphedema, not elsewhere classified  Pain in right thigh     Problem List Patient Active Problem List   Diagnosis Date Noted  . 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 Zoster 04/04/2015  . Nausea with vomiting 11/18/2014  . Constipation 11/18/2014  . Left-sided thoracic back pain   . Bone metastases ([place]) 11/16/2014  . Back pain 11/15/2014  . Uncontrolled pain 11/14/2014  . 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 Chest wall pain 03/21/2014  . Abnormal LFTs (liver function tests) 09/12/2013  . Breast cancer of upper-inner quadrant of  right female breast ([place]) 08/18/2013  . 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

## 2015-10-05 NOTE — Telephone Encounter (Signed)
appt made and avs printed °

## 2015-10-08 ENCOUNTER — Telehealth: Payer: Self-pay | Admitting: *Deleted

## 2015-10-08 ENCOUNTER — Other Ambulatory Visit (HOSPITAL_BASED_OUTPATIENT_CLINIC_OR_DEPARTMENT_OTHER): Payer: 59

## 2015-10-08 ENCOUNTER — Ambulatory Visit: Payer: 59 | Admitting: Physical Therapy

## 2015-10-08 ENCOUNTER — Other Ambulatory Visit: Payer: Self-pay | Admitting: *Deleted

## 2015-10-08 DIAGNOSIS — M79651 Pain in right thigh: Secondary | ICD-10-CM

## 2015-10-08 DIAGNOSIS — C50211 Malignant neoplasm of upper-inner quadrant of right female breast: Secondary | ICD-10-CM

## 2015-10-08 DIAGNOSIS — I890 Lymphedema, not elsewhere classified: Secondary | ICD-10-CM

## 2015-10-08 DIAGNOSIS — C771 Secondary and unspecified malignant neoplasm of intrathoracic lymph nodes: Secondary | ICD-10-CM

## 2015-10-08 DIAGNOSIS — J90 Pleural effusion, not elsewhere classified: Secondary | ICD-10-CM

## 2015-10-08 LAB — CBC WITH DIFFERENTIAL/PLATELET
BASO%: 2 % (ref 0.0–2.0)
BASOS ABS: 0.1 10*3/uL (ref 0.0–0.1)
EOS ABS: 0.1 10*3/uL (ref 0.0–0.5)
EOS%: 1.7 % (ref 0.0–7.0)
HEMATOCRIT: 34.9 % (ref 34.8–46.6)
HEMOGLOBIN: 12.4 g/dL (ref 11.6–15.9)
LYMPH%: 23.1 % (ref 14.0–49.7)
MCH: 35.3 pg — ABNORMAL HIGH (ref 25.1–34.0)
MCHC: 35.5 g/dL (ref 31.5–36.0)
MCV: 99.4 fL (ref 79.5–101.0)
MONO#: 0.7 10*3/uL (ref 0.1–0.9)
MONO%: 18.7 % — AB (ref 0.0–14.0)
NEUT%: 54.5 % (ref 38.4–76.8)
NEUTROS ABS: 1.9 10*3/uL (ref 1.5–6.5)
NRBC: 0 % (ref 0–0)
PLATELETS: 250 10*3/uL (ref 145–400)
RBC: 3.51 10*6/uL — AB (ref 3.70–5.45)
RDW: 14.1 % (ref 11.2–14.5)
WBC: 3.5 10*3/uL — AB (ref 3.9–10.3)
lymph#: 0.8 10*3/uL — ABNORMAL LOW (ref 0.9–3.3)

## 2015-10-08 MED ORDER — RABEPRAZOLE SODIUM 5 MG PO CPSP: 5.0000 mg | ORAL_CAPSULE | Freq: Every day | ORAL | Status: DC

## 2015-10-08 NOTE — Therapy (Signed)
[place], [place], [address] Phone: [phone]   Fax:  [phone]  Physical Therapy Treatment  Patient Details  Name: [name] [name] MRN: [id number] Date of Birth: 1961-03-16 Referring Provider: Dr. Lurline Del  Encounter Date: 10/08/2015      PT End of Session - 10/08/15 1210    Visit Number 66  53 for lymph   Number of Visits 72  for lymph   Date for PT Re-Evaluation 12/10/15   Authorization - Visit Number 140   PT Start Time 1024   PT Stop Time 1107   PT Time Calculation (min) 43 min   Activity Tolerance Patient tolerated treatment well   Behavior During Therapy [facility] for tasks assessed/performed      Past Medical History  Diagnosis Date  . Seizures ([place]) 2010    Isolated incident.  Marland Kitchen PONV (postoperative nausea and vomiting)   . Peripheral vascular disease ([place]) 02/2010    blood clot related to porta cath  . S/P radiation therapy 07/17/2014 through 08/02/2014     Left mediastinum, left seventh rib 3250 cGy in 13 sessions   . S/P radiation therapy 12/11/2014 through 12/22/2014     Left parietal calvarium 2400 cGy in 8 sessions   . Breast cancer ([place]) dx'd 2005/2011  . Bone metastases ([place]) dx'd 05/2014    Past Surgical History  Procedure Laterality Date  . Breast lumpectomy  2005  . Axillary lymph node dissection  Dec. 2011  . Portacath placement  12/11  . Removal portacath    . Mediastinotomy chamberlain mcneil Left 06/02/2013    Procedure: MEDIASTINOTOMY CHAMBERLAIN MCNEIL;  Surgeon: [name], MD;  Location: [facility];  Service: Thoracic;  Laterality: Left;  LEFT ANTERIOR MEDIASTINOTOMY     There were no vitals filed for this visit.      Subjective Assessment - 10/08/15 1025    Subjective Mother-in-law fell  overnight at the assisted living.  Back of hand puffed up on Friday; most of it is better, just some swelling at two knuckes.  Tried the Flexitouch but it hurt the groin.   Currently in Pain? Yes   Pain Score 3    Pain Location Groin   Pain Orientation Right   Pain Score 6   Pain Location Axilla   Pain Orientation Right            [facility] PT Assessment - 10/08/15 0001    Observation/Other Assessments   Observations slight puffiness around MCP joints           LYMPHEDEMA/ONCOLOGY QUESTIONNAIRE - 10/08/15 1029    Right Upper Extremity Lymphedema   15 cm Proximal to Olecranon Process 29.3 cm   10 cm Proximal to Olecranon Process 27.5 cm   Olecranon Process 23.9 cm   15 cm Proximal to Ulnar Styloid Process 23.9 cm   10 cm Proximal to Ulnar Styloid Process 21.1 cm   Just Proximal to Ulnar Styloid Process 15.7 cm   Across Hand at [employer] 18.6 cm   At [place] of 2nd Digit 5.8 cm   Other around chest at level between two scars at right lateral breast, 96.5 cm. (on exhalation)  before treatment today                  [facility] Adult PT Treatment/Exercise - 10/08/15 0001    Manual Therapy   Edema Management arm and chest circumference measurements taken   Manual Lymphatic Drainage (MLD) In left  sidelying, posterior interaxillary anastomosis and right axillo-inguinal anastomosis; in supine, short neck, left axilla and anterior interaxillary anastomosis, right groin and axillo-inguinal anastomosis, area between right breast scars, directing toward pathways, and right upper extremity from knuckles to shoulder.  In right sidelying, left periscapular area toward left groin.   Other Manual Therapy soft tissue work at superior and entire aspect of right medial thigh in right sidelying for pain relief and muscle relaxation, gentle myofascial release                PT Education - 10/08/15 1209    Education provided Yes   Education Details about using compression on hand  and arm at the moment in an effort to stop any increase in swelling in right hand and arm   Person(s) Educated Patient   Methods Explanation   Comprehension Verbalized understanding          PT Short Term Goals - 02/27/15 1011    PT SHORT TERM GOAL #1   Title pain with walking decreased >/= 25%   Time 3   Period Weeks   Status Achieved   PT SHORT TERM GOAL #2   Title pelvis stays in correct alignment for 3 weeks   Time 3   Period Weeks   Status Achieved           PT Long Term Goals - 08/14/15 1317    PT LONG TERM GOAL #1   Title indpendent with HEP   Time 12   Period Weeks   Status On-going   PT LONG TERM GOAL #2   Title pain with walking decreased >/= 80%   Time 12   Period Weeks   Status On-going   PT LONG TERM GOAL #3   Title go up and down steps with a step over step pattern   Time 6   Status Deferred   PT LONG TERM GOAL #4   Title being able to last for 5 days without therapy and pain not going above a 8/10 due to decreased muscle soreness   Time 12   Period Weeks   Status Achieved           Long Term Clinic Goals - 10/05/15 1234    CC Long Term Goal  #2   Title Pt. will report swelling is adequately managed to enable ADL function at a consistent level.   Status On-going   CC Long Term Goal  #4   Title Pain/discomfort at right axilla area will be controlled at 6/10 or less.   Status On-going   CC Long Term Goal  #5   Title Patient will avoid infection by ongoing management of her lymphedema at right breast/axilla/upper arm areas.   Status On-going            Plan - 10/08/15 1210    Clinical Impression Statement Chest circumference measurement was increased by 1.5 cm. compared to last measurement done some months ago; patient developed hand swelling over the weekend, but elevated hand and did gentle massage plus wore compression garments, and found it did improve.  She also used her Flexitouch pump but it made her groin area pain get worse.    Rehab Potential Good   Clinical Impairments Affecting Rehab Potential active cancer   PT Frequency 2x / week   PT Duration 12 weeks   PT Treatment/Interventions Manual techniques;Manual lymph drainage   PT Next Visit Plan Teach patient to do manual lymph drainage.  Continue manual lymph drainage  for right upper outer breast and for right UE; also for left rib pain; continue right thigh soft tissue work.   Consulted and Agree with Plan of Care Patient      Patient will benefit from skilled therapeutic intervention in order to improve the following deficits and impairments:  Pain, Increased muscle spasms, Difficulty walking, Increased edema  Visit Diagnosis: Lymphedema, not elsewhere classified  Pain in right thigh     Problem List Patient Active Problem List   Diagnosis Date Noted  . 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 Zoster 04/04/2015  . Nausea with vomiting 11/18/2014  . Constipation 11/18/2014  . Left-sided thoracic back pain   . Bone metastases ([place]) 11/16/2014  . Back pain 11/15/2014  . Uncontrolled pain 11/14/2014  . 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 Chest wall pain 03/21/2014  . Abnormal LFTs (liver function tests) 09/12/2013  . Breast cancer of upper-inner quadrant of right female breast ([place]) 08/18/2013  . 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

## 2015-10-11 ENCOUNTER — Telehealth: Payer: Self-pay | Admitting: *Deleted

## 2015-10-11 ENCOUNTER — Other Ambulatory Visit: Payer: Self-pay | Admitting: *Deleted

## 2015-10-11 DIAGNOSIS — R7989 Other specified abnormal findings of blood chemistry: Secondary | ICD-10-CM

## 2015-10-11 DIAGNOSIS — R945 Abnormal results of liver function studies: Secondary | ICD-10-CM

## 2015-10-11 NOTE — Telephone Encounter (Signed)
This RN called pt's insurance provider per need for prior auth for aciphex 5 mg sprinkles- note pt has tried omeprazole, pantozole, carafate with adverse liver function involvement.   Per insurance provider only form of aciphex covered under pt's policy is rabeprazole 20 mg ER.  This RN gave concern per request for lower dose in this patient with known issues including noted abnormal liver function enzymes when other drugs were used. Informed pt has no benefit to prior authorization medication not on formulary- no policy in place to request above drug.  Per review with MD/pharmacy rabeprazole at the 20 mg dose is appropriate. Pt may use 1 tablet weekly to start then increase to 2 tablets if needed a week. Note this drug was studied at 20 mg a day with least liver involvement.  This RN attempted to contact pt and obtained identified VM. Detailed message left.

## 2015-10-12 ENCOUNTER — Ambulatory Visit: Payer: 59 | Admitting: Physical Therapy

## 2015-10-12 DIAGNOSIS — M79651 Pain in right thigh: Secondary | ICD-10-CM

## 2015-10-12 DIAGNOSIS — I890 Lymphedema, not elsewhere classified: Secondary | ICD-10-CM

## 2015-10-12 NOTE — Therapy (Signed)
[place], [place], [address] Phone: [phone]   Fax:  [phone]  Physical Therapy Treatment  Patient Details  Name: [name] [name] MRN: [id number] Date of Birth: 02/16/1961 Referring Provider: Dr. Lurline Del  Encounter Date: 10/12/2015      PT End of Session - 10/12/15 1234    Visit Number 69  54   Number of Visits 72  for lymph   Date for PT Re-Evaluation 12/10/15   Authorization - Visit Number 140   PT Start Time [id number]   PT Stop Time 1102   PT Time Calculation (min) 44 min   Activity Tolerance Patient tolerated treatment well   Behavior During Therapy [facility] for tasks assessed/performed      Past Medical History  Diagnosis Date  . Seizures ([place]) 2010    Isolated incident.  Marland Kitchen PONV (postoperative nausea and vomiting)   . Peripheral vascular disease ([place]) 02/2010    blood clot related to porta cath  . S/P radiation therapy 07/17/2014 through 08/02/2014     Left mediastinum, left seventh rib 3250 cGy in 13 sessions   . S/P radiation therapy 12/11/2014 through 12/22/2014     Left parietal calvarium 2400 cGy in 8 sessions   . Breast cancer ([place]) dx'd 2005/2011  . Bone metastases ([place]) dx'd 05/2014    Past Surgical History  Procedure Laterality Date  . Breast lumpectomy  2005  . Axillary lymph node dissection  Dec. 2011  . Portacath placement  12/11  . Removal portacath    . Mediastinotomy chamberlain mcneil Left 06/02/2013    Procedure: MEDIASTINOTOMY CHAMBERLAIN MCNEIL;  Surgeon: [name], MD;  Location: [facility];  Service: Thoracic;  Laterality: Left;  LEFT ANTERIOR MEDIASTINOTOMY     There were no vitals filed for this visit.      Subjective Assessment - 10/12/15 1019    Subjective I didn't sleep last night, so  I'm not myself.  Hand swelling up so that I couldn't put the ring on.   Currently in Pain? Yes   Pain Score 2    Pain Location Groin   Pain Orientation Right   Pain Score 7  6 to   Pain Location Axilla   Pain Orientation Right   Aggravating Factors  more swelling   Pain Relieving Factors therapy   Pain Score 2   Pain Location Rib cage   Pain Orientation Left                         [facility] Adult PT Treatment/Exercise - 10/12/15 0001    Manual Therapy   Manual Lymphatic Drainage (MLD) In left sidelying, posterior interaxillary anastomosis and right axillo-inguinal anastomosis; in supine, short neck, left axilla and anterior interaxillary anastomosis, right groin and axillo-inguinal anastomosis, and right upper extremity from fingers to shoulder.  In right sidelying, left periscapular area toward left groin.   Other Manual Therapy soft tissue work at superior and entire aspect of right medial thigh in right sidelying for pain relief and muscle relaxation, gentle myofascial release                  PT Short Term Goals - 02/27/15 1011    PT SHORT TERM GOAL #1   Title pain with walking decreased >/= 25%   Time 3   Period Weeks   Status Achieved   PT SHORT TERM GOAL #2   Title pelvis stays in correct alignment  for 3 weeks   Time 3   Period Weeks   Status Achieved           PT Long Term Goals - 08/14/15 1317    PT LONG TERM GOAL #1   Title indpendent with HEP   Time 12   Period Weeks   Status On-going   PT LONG TERM GOAL #2   Title pain with walking decreased >/= 80%   Time 12   Period Weeks   Status On-going   PT LONG TERM GOAL #3   Title go up and down steps with a step over step pattern   Time 6   Status Deferred   PT LONG TERM GOAL #4   Title being able to last for 5 days without therapy and pain not going above a 8/10 due to decreased muscle soreness   Time 12   Period Weeks   Status Achieved           Long Term Clinic Goals -  10/12/15 1238    CC Long Term Goal  #2   Title Pt. will report swelling is adequately managed to enable ADL function at a consistent level.   Status On-going   CC Long Term Goal  #4   Title Pain/discomfort at right axilla area will be controlled at 6/10 or less.   Status On-going   CC Long Term Goal  #5   Title Patient will avoid infection by ongoing management of her lymphedema at right breast/axilla/upper arm areas.   Status On-going            Plan - 10/12/15 1235    Clinical Impression Statement Patient with ongoing fullness at right upper outer breast area and now with mild hand swelling that started a week ago; both benefit from therapy.  Patient noted visible improvement in hand swelling and a decrease in hand discomfort at end of session.   Rehab Potential Good   Clinical Impairments Affecting Rehab Potential active cancer   PT Frequency 2x / week   PT Duration 12 weeks   PT Treatment/Interventions Manual techniques;Manual lymph drainage   PT Next Visit Plan Teach patient to do manual lymph drainage.  Continue manual lymph drainage for right upper outer breast and for right UE; also for left rib pain; continue right thigh soft tissue work.   Consulted and Agree with Plan of Care Patient      Patient will benefit from skilled therapeutic intervention in order to improve the following deficits and impairments:  Pain, Increased muscle spasms, Difficulty walking, Increased edema  Visit Diagnosis: Lymphedema, not elsewhere classified  Pain in right thigh     Problem List Patient Active Problem List   Diagnosis Date Noted  . 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 Zoster 04/04/2015  . Nausea with vomiting 11/18/2014  . Constipation 11/18/2014  . Left-sided thoracic back pain   . Bone metastases ([place]) 11/16/2014  . Back pain 11/15/2014  . Uncontrolled pain 11/14/2014  . 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 Chest wall pain 03/21/2014  .  Abnormal LFTs (liver function tests) 09/12/2013  . Breast cancer of upper-inner quadrant of right female breast ([place]) 08/18/2013  . 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

## 2015-10-15 ENCOUNTER — Encounter: Payer: Self-pay | Admitting: *Deleted

## 2015-10-15 ENCOUNTER — Encounter: Payer: Self-pay | Admitting: Physical Therapy

## 2015-10-15 ENCOUNTER — Ambulatory Visit: Payer: 59 | Admitting: Physical Therapy

## 2015-10-15 ENCOUNTER — Other Ambulatory Visit (HOSPITAL_BASED_OUTPATIENT_CLINIC_OR_DEPARTMENT_OTHER): Payer: 59

## 2015-10-15 DIAGNOSIS — M62838 Other muscle spasm: Secondary | ICD-10-CM

## 2015-10-15 DIAGNOSIS — I890 Lymphedema, not elsewhere classified: Secondary | ICD-10-CM

## 2015-10-15 DIAGNOSIS — C771 Secondary and unspecified malignant neoplasm of intrathoracic lymph nodes: Secondary | ICD-10-CM | POA: Diagnosis not present

## 2015-10-15 DIAGNOSIS — C50211 Malignant neoplasm of upper-inner quadrant of right female breast: Secondary | ICD-10-CM

## 2015-10-15 DIAGNOSIS — M79651 Pain in right thigh: Secondary | ICD-10-CM

## 2015-10-15 DIAGNOSIS — J90 Pleural effusion, not elsewhere classified: Secondary | ICD-10-CM

## 2015-10-15 LAB — CBC WITH DIFFERENTIAL/PLATELET
BASO%: 1.7 % (ref 0.0–2.0)
Basophils Absolute: 0.1 10*3/uL (ref 0.0–0.1)
EOS ABS: 0.1 10*3/uL (ref 0.0–0.5)
EOS%: 2.6 % (ref 0.0–7.0)
HCT: 36.2 % (ref 34.8–46.6)
HGB: 13.1 g/dL (ref 11.6–15.9)
LYMPH%: 24.1 % (ref 14.0–49.7)
MCH: 36 pg — ABNORMAL HIGH (ref 25.1–34.0)
MCHC: 36.2 g/dL — AB (ref 31.5–36.0)
MCV: 99.5 fL (ref 79.5–101.0)
MONO#: 0.3 10*3/uL (ref 0.1–0.9)
MONO%: 7.3 % (ref 0.0–14.0)
NEUT%: 64.3 % (ref 38.4–76.8)
NEUTROS ABS: 2.2 10*3/uL (ref 1.5–6.5)
NRBC: 0 % (ref 0–0)
PLATELETS: 300 10*3/uL (ref 145–400)
RBC: 3.64 10*6/uL — AB (ref 3.70–5.45)
RDW: 14 % (ref 11.2–14.5)
WBC: 3.4 10*3/uL — AB (ref 3.9–10.3)
lymph#: 0.8 10*3/uL — ABNORMAL LOW (ref 0.9–3.3)

## 2015-10-15 NOTE — Therapy (Signed)
[place], [place], [address] Phone: [phone]   Fax:  [phone]  Physical Therapy Treatment  Patient Details  Name: [name] [name] MRN: [id number] Date of Birth: Mar 07, 1961 Referring Provider: Dr. Lurline Del  Encounter Date: 10/15/2015      PT End of Session - 10/15/15 1111    Visit Number 59   Number of Visits 73  for lymph   Date for PT Re-Evaluation 12/10/15   PT Start Time 1000   PT Stop Time 1111   PT Time Calculation (min) 71 min   Activity Tolerance Patient tolerated treatment well   Behavior During Therapy [facility] for tasks assessed/performed      Past Medical History  Diagnosis Date  . Seizures ([place]) 2010    Isolated incident.  Marland Kitchen PONV (postoperative nausea and vomiting)   . Peripheral vascular disease ([place]) 02/2010    blood clot related to porta cath  . S/P radiation therapy 07/17/2014 through 08/02/2014     Left mediastinum, left seventh rib 3250 cGy in 13 sessions   . S/P radiation therapy 12/11/2014 through 12/22/2014     Left parietal calvarium 2400 cGy in 8 sessions   . Breast cancer ([place]) dx'd 2005/2011  . Bone metastases ([place]) dx'd 05/2014    Past Surgical History  Procedure Laterality Date  . Breast lumpectomy  2005  . Axillary lymph node dissection  Dec. 2011  . Portacath placement  12/11  . Removal portacath    . Mediastinotomy chamberlain mcneil Left 06/02/2013    Procedure: MEDIASTINOTOMY CHAMBERLAIN MCNEIL;  Surgeon: [name], MD;  Location: [facility];  Service: Thoracic;  Laterality: Left;  LEFT ANTERIOR MEDIASTINOTOMY     There were no vitals filed for this visit.      Subjective Assessment - 10/15/15 1005    Subjective My hand has been swelling for about 2 weeks now.  It's mostly in my  fingers and knuckles.   Currently in Pain? Yes   Pain Score 6    Pain Location Arm   Pain Orientation Right   Pain Descriptors / Indicators Sore   Pain Type Chronic pain   Pain Radiating Towards Right chest, upper arm, and fingers   Pain Onset More than a month ago   Pain Frequency Constant   Pain Score 2   Pain Location Groin   Pain Orientation Right   Pain Descriptors / Indicators Tightness   Pain Type Chronic pain                         [facility] Adult PT Treatment/Exercise - 10/15/15 0001    Manual Therapy   Manual Lymphatic Drainage (MLD) In left sidelying, posterior interaxillary anastomosis and right axillo-inguinal anastomosis; in supine, short neck, left axilla and anterior interaxillary anastomosis, right groin and axillo-inguinal anastomosis, and right upper extremity from fingers to shoulder.  In right sidelying, left periscapular area toward left groin.   Other Manual Therapy soft tissue work at superior and entire aspect of right medial thigh in right sidelying for pain relief and muscle relaxation, gentle myofascial release                  PT Short Term Goals - 02/27/15 1011    PT SHORT TERM GOAL #1   Title pain with walking decreased >/= 25%   Time 3   Period Weeks   Status Achieved   PT SHORT TERM GOAL #2  Title pelvis stays in correct alignment for 3 weeks   Time 3   Period Weeks   Status Achieved           PT Long Term Goals - 08/14/15 1317    PT LONG TERM GOAL #1   Title indpendent with HEP   Time 12   Period Weeks   Status On-going   PT LONG TERM GOAL #2   Title pain with walking decreased >/= 80%   Time 12   Period Weeks   Status On-going   PT LONG TERM GOAL #3   Title go up and down steps with a step over step pattern   Time 6   Status Deferred   PT LONG TERM GOAL #4   Title being able to last for 5 days without therapy and pain not going above a 8/10 due to decreased muscle soreness   Time 12   Period Weeks    Status Achieved           Long Term Clinic Goals - 10/12/15 1238    CC Long Term Goal  #2   Title Pt. will report swelling is adequately managed to enable ADL function at a consistent level.   Status On-going   CC Long Term Goal  #4   Title Pain/discomfort at right axilla area will be controlled at 6/10 or less.   Status On-going   CC Long Term Goal  #5   Title Patient will avoid infection by ongoing management of her lymphedema at right breast/axilla/upper arm areas.   Status On-going            Plan - 10/15/15 1112    Clinical Impression Statement I feel alot looser now.  I can lift my leg (right) higher.  My hand feels looser and less swollen.  She had some muscle spasms in left leg during treatment that resolved with stretching.   Rehab Potential Good   Clinical Impairments Affecting Rehab Potential active cancer   PT Frequency 2x / week   PT Duration 12 weeks   PT Treatment/Interventions Manual techniques;Manual lymph drainage   PT Next Visit Plan Continue manual lymph drainage for right upper outer breast and for right UE; also for left rib pain; continue right thigh soft tissue work.   Consulted and Agree with Plan of Care Patient      Patient will benefit from skilled therapeutic intervention in order to improve the following deficits and impairments:  Pain, Increased muscle spasms, Difficulty walking, Increased edema  Visit Diagnosis: Lymphedema, not elsewhere classified  Pain in right thigh  Other muscle spasm     Problem List Patient Active Problem List   Diagnosis Date Noted  . 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 Zoster 04/04/2015  . Nausea with vomiting 11/18/2014  . Constipation 11/18/2014  . Left-sided thoracic back pain   . Bone metastases ([place]) 11/16/2014  . Back pain 11/15/2014  . Uncontrolled pain 11/14/2014  . 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 Chest wall pain 03/21/2014  . Abnormal LFTs (liver function  tests) 09/12/2013  . Breast cancer of upper-inner quadrant of right female breast ([place]) 08/18/2013  . 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

## 2015-10-16 ENCOUNTER — Encounter: Payer: Self-pay | Admitting: Oncology

## 2015-10-16 NOTE — Progress Notes (Signed)
sent prior auth req for rabeprazole -covermymeds

## 2015-10-18 ENCOUNTER — Ambulatory Visit: Payer: 59 | Admitting: Physical Therapy

## 2015-10-18 DIAGNOSIS — M62838 Other muscle spasm: Secondary | ICD-10-CM

## 2015-10-18 DIAGNOSIS — I890 Lymphedema, not elsewhere classified: Secondary | ICD-10-CM | POA: Diagnosis not present

## 2015-10-18 DIAGNOSIS — M79651 Pain in right thigh: Secondary | ICD-10-CM

## 2015-10-18 NOTE — Therapy (Signed)
[place], [place], [address] Phone: [phone]   Fax:  [phone]  Physical Therapy Treatment  Patient Details  Name: [name] [name] MRN: [id number] Date of Birth: April 23, 1960 Referring Provider: Dr. Lurline Del  Encounter Date: 10/18/2015      PT End of Session - 10/18/15 1230    Visit Number [id number] for lymph   Number of Visits 72  for lymph   Date for PT Re-Evaluation 12/10/15   PT Start Time 1017   PT Stop Time 1103   PT Time Calculation (min) 46 min   Activity Tolerance Patient tolerated treatment well   Behavior During Therapy [facility] for tasks assessed/performed      Past Medical History  Diagnosis Date  . Seizures ([place]) 2010    Isolated incident.  Marland Kitchen PONV (postoperative nausea and vomiting)   . Peripheral vascular disease ([place]) 02/2010    blood clot related to porta cath  . S/P radiation therapy 07/17/2014 through 08/02/2014     Left mediastinum, left seventh rib 3250 cGy in 13 sessions   . S/P radiation therapy 12/11/2014 through 12/22/2014     Left parietal calvarium 2400 cGy in 8 sessions   . Breast cancer ([place]) dx'd 2005/2011  . Bone metastases ([place]) dx'd 05/2014    Past Surgical History  Procedure Laterality Date  . Breast lumpectomy  2005  . Axillary lymph node dissection  Dec. 2011  . Portacath placement  12/11  . Removal portacath    . Mediastinotomy chamberlain mcneil Left 06/02/2013    Procedure: MEDIASTINOTOMY CHAMBERLAIN MCNEIL;  Surgeon: [name], MD;  Location: [facility];  Service: Thoracic;  Laterality: Left;  LEFT ANTERIOR MEDIASTINOTOMY     There were no vitals filed for this visit.      Subjective Assessment - 10/18/15 1025    Subjective have some pain in lower back today .  She thinks its from  compensating because of thoracic involvement and pain    Currently in Pain? Yes   Pain Score 6    Pain Location Arm   Pain Orientation Right   Pain Descriptors / Indicators Sore   Pain Type Chronic pain   Pain Location Groin   Pain Orientation Right   Pain Descriptors / Indicators Tightness   Pain Type Chronic pain   Pain Onset 1 to 4 weeks ago   Pain Frequency Constant                         [facility] Adult PT Treatment/Exercise - 10/18/15 0001    Manual Therapy   Myofascial Release to low back and right posterior hip    Manual Lymphatic Drainage (MLD) In left sidelying, posterior interaxillary anastomosis and right axillo-inguinal anastomosis; in supine, short neck, left axilla and anterior interaxillary anastomosis, right groin and axillo-inguinal anastomosis, and right upper extremity from fingers to shoulder.  In right sidelying, left periscapular area toward left groin.   Other Manual Therapy soft tissue work at superior and entire aspect of right medial thigh in right sidelying for pain relief and muscle relaxation, gentle myofascial release                  PT Short Term Goals - 02/27/15 1011    PT SHORT TERM GOAL #1   Title pain with walking decreased >/= 25%   Time 3   Period Weeks   Status Achieved   PT SHORT TERM  GOAL #2   Title pelvis stays in correct alignment for 3 weeks   Time 3   Period Weeks   Status Achieved           PT Long Term Goals - 08/14/15 1317    PT LONG TERM GOAL #1   Title indpendent with HEP   Time 12   Period Weeks   Status On-going   PT LONG TERM GOAL #2   Title pain with walking decreased >/= 80%   Time 12   Period Weeks   Status On-going   PT LONG TERM GOAL #3   Title go up and down steps with a step over step pattern   Time 6   Status Deferred   PT LONG TERM GOAL #4   Title being able to last for 5 days without therapy and pain not going above a 8/10 due to decreased muscle soreness   Time 12    Period Weeks   Status Achieved           Long Term Clinic Goals - 10/12/15 1238    CC Long Term Goal  #2   Title Pt. will report swelling is adequately managed to enable ADL function at a consistent level.   Status On-going   CC Long Term Goal  #4   Title Pain/discomfort at right axilla area will be controlled at 6/10 or less.   Status On-going   CC Long Term Goal  #5   Title Patient will avoid infection by ongoing management of her lymphedema at right breast/axilla/upper arm areas.   Status On-going            Plan - 10/18/15 1232    Clinical Impression Statement Pt reports that she feels symtomatic relief from this treatment and it allows her to manage what she needs to do at home.    Rehab Potential Good   Clinical Impairments Affecting Rehab Potential active cancer   PT Next Visit Plan Continue manual lymph drainage for right upper outer breast and for right UE; also for left rib pain; continue right thigh soft tissue work.  Faxed demographics to [employer] [facility] per patient's request to have her fitted for a compression glove as she currently only has a gauntlet but has finger swellling.      Patient will benefit from skilled therapeutic intervention in order to improve the following deficits and impairments:  Pain, Increased muscle spasms, Difficulty walking, Increased edema  Visit Diagnosis: Lymphedema, not elsewhere classified  Pain in right thigh  Other muscle spasm     Problem List Patient Active Problem List   Diagnosis Date Noted  . 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 Zoster 04/04/2015  . Nausea with vomiting 11/18/2014  . Constipation 11/18/2014  . Left-sided thoracic back pain   . Bone metastases ([place]) 11/16/2014  . Back pain 11/15/2014  . Uncontrolled pain 11/14/2014  . 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 Chest wall pain 03/21/2014  . Abnormal LFTs (liver function tests) 09/12/2013  . Breast cancer of upper-inner  quadrant of right female breast ([place]) 08/18/2013  . 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 Owens Shark PT   [name] 10/18/2015, 12:37 PM  [place], [place], [address] Phone: [phone]   Fax:  [phone]  Name: [name] [name] MRN: [id number] Date of Birth: [birth year]

## 2015-10-22 ENCOUNTER — Ambulatory Visit: Payer: 59 | Admitting: Physical Therapy

## 2015-10-22 ENCOUNTER — Encounter: Payer: Self-pay | Admitting: Physical Therapy

## 2015-10-22 ENCOUNTER — Other Ambulatory Visit (HOSPITAL_BASED_OUTPATIENT_CLINIC_OR_DEPARTMENT_OTHER): Payer: 59

## 2015-10-22 ENCOUNTER — Other Ambulatory Visit: Payer: Self-pay | Admitting: *Deleted

## 2015-10-22 DIAGNOSIS — C50211 Malignant neoplasm of upper-inner quadrant of right female breast: Secondary | ICD-10-CM

## 2015-10-22 DIAGNOSIS — M79651 Pain in right thigh: Secondary | ICD-10-CM

## 2015-10-22 DIAGNOSIS — I890 Lymphedema, not elsewhere classified: Secondary | ICD-10-CM | POA: Diagnosis not present

## 2015-10-22 DIAGNOSIS — C7951 Secondary malignant neoplasm of bone: Secondary | ICD-10-CM

## 2015-10-22 DIAGNOSIS — C771 Secondary and unspecified malignant neoplasm of intrathoracic lymph nodes: Secondary | ICD-10-CM

## 2015-10-22 DIAGNOSIS — M62838 Other muscle spasm: Secondary | ICD-10-CM

## 2015-10-22 DIAGNOSIS — J90 Pleural effusion, not elsewhere classified: Secondary | ICD-10-CM

## 2015-10-22 LAB — CBC WITH DIFFERENTIAL/PLATELET
BASO%: 2.1 % — AB (ref 0.0–2.0)
Basophils Absolute: 0.1 10*3/uL (ref 0.0–0.1)
EOS%: 2.5 % (ref 0.0–7.0)
Eosinophils Absolute: 0.1 10*3/uL (ref 0.0–0.5)
HEMATOCRIT: 36.8 % (ref 34.8–46.6)
HGB: 12.8 g/dL (ref 11.6–15.9)
LYMPH#: 0.7 10*3/uL — AB (ref 0.9–3.3)
LYMPH%: 21.6 % (ref 14.0–49.7)
MCH: 36.1 pg — ABNORMAL HIGH (ref 25.1–34.0)
MCHC: 34.7 g/dL (ref 31.5–36.0)
MCV: 104.1 fL — ABNORMAL HIGH (ref 79.5–101.0)
MONO#: 0.2 10*3/uL (ref 0.1–0.9)
MONO%: 7.5 % (ref 0.0–14.0)
NEUT#: 2 10*3/uL (ref 1.5–6.5)
NEUT%: 66.3 % (ref 38.4–76.8)
Platelets: 283 10*3/uL (ref 145–400)
RBC: 3.54 10*6/uL — AB (ref 3.70–5.45)
RDW: 14.8 % — ABNORMAL HIGH (ref 11.2–14.5)
WBC: 3 10*3/uL — AB (ref 3.9–10.3)

## 2015-10-22 LAB — COMPREHENSIVE METABOLIC PANEL
ALT: 30 U/L (ref 0–55)
AST: 27 U/L (ref 5–34)
Albumin: 4.2 g/dL (ref 3.5–5.0)
Alkaline Phosphatase: 63 U/L (ref 40–150)
Anion Gap: 10 mEq/L (ref 3–11)
BILIRUBIN TOTAL: 0.36 mg/dL (ref 0.20–1.20)
BUN: 10.9 mg/dL (ref 7.0–26.0)
CHLORIDE: 101 meq/L (ref 98–109)
CO2: 25 meq/L (ref 22–29)
Calcium: 9.6 mg/dL (ref 8.4–10.4)
Creatinine: 0.9 mg/dL (ref 0.6–1.1)
EGFR: 69 mL/min/{1.73_m2} — ABNORMAL LOW (ref >90)
Glucose: 73 mg/dl (ref 70–140)
Potassium: 3.8 mEq/L (ref 3.5–5.1)
Sodium: 136 mEq/L (ref 136–145)
Total Protein: 7.9 g/dL (ref 6.4–8.3)

## 2015-10-22 NOTE — Therapy (Signed)
[facility] [address] [place], [place], [address] Phone: [phone]   Fax:  [phone]  Physical Therapy Treatment  Patient Details  Name: [name] [name] MRN: [id number] Date of Birth: [birth year] Referring Provider: Dr. Ruthann Cancer  Encounter Date: 10/22/2015      PT End of Session - 10/22/15 1132    Visit Number 90   Date for PT Re-Evaluation 12/10/15   PT Start Time 1003   PT Stop Time 1105   PT Time Calculation (min) 62 min   Activity Tolerance Patient tolerated treatment well   Behavior During Therapy [facility] for tasks assessed/performed      Past Medical History:  Diagnosis Date  . Bone metastases ([facility]) dx'd 05/2014  . Breast cancer ([facility]) dx'd 2005/2011  . Peripheral vascular disease ([facility]) 02/2010   blood clot related to porta cath  . PONV (postoperative nausea and vomiting)   . S/P radiation therapy 07/17/2014 through 08/02/2014    Left mediastinum, left seventh rib 3250 cGy in 13 sessions   . S/P radiation therapy 12/11/2014 through 12/22/2014    Left parietal calvarium 2400 cGy in 8 sessions   . Seizures ([facility]) 2010   Isolated incident.    Past Surgical History:  Procedure Laterality Date  . AXILLARY LYMPH NODE DISSECTION  Dec. 2011  . BREAST LUMPECTOMY  2005  . MEDIASTINOTOMY CHAMBERLAIN MCNEIL Left 06/02/2013   Procedure: MEDIASTINOTOMY CHAMBERLAIN MCNEIL;  Surgeon: Loreli Slot, MD;  Location: [facility];  Service: Thoracic;  Laterality: Left;  LEFT ANTERIOR MEDIASTINOTOMY   . PORTACATH PLACEMENT  12/11  . removal portacath      There were no vitals filed for this visit.      Subjective Assessment - 10/22/15 1009    Subjective My low back pain is better but still twinges.  My right thigh feels a little tight.  My breast (lateral) feels  really swollen and my hand is somewhat puffy.   Currently in Pain? Yes   Pain Score 2    Pain Location Leg  medial thigh   Pain Orientation Right   Pain Descriptors / Indicators Tightness   Pain Type Chronic pain   Pain Onset More than a month ago   Pain Frequency Intermittent                         [facility] Adult PT Treatment/Exercise - 10/22/15 0001      Manual Therapy   Myofascial Release to low back and sacral region in right sidelying using Biotone   Manual Lymphatic Drainage (MLD) In left sidelying, posterior interaxillary anastomosis and right axillo-inguinal anastomosis; in supine, short neck, left axilla and anterior interaxillary anastomosis, right groin and axillo-inguinal anastomosis, and right upper extremity from fingers to shoulder.  In right sidelying, left periscapular area toward left groin.   Other Manual Therapy soft tissue work at superior and entire aspect of right medial thigh in right sidelying for pain relief and muscle relaxation, gentle myofascial release                  PT Short Term Goals - 02/27/15 1011      PT SHORT TERM GOAL #1   Title pain with walking decreased >/= 25%   Time 3   Period Weeks   Status Achieved     PT SHORT TERM GOAL #2   Title pelvis stays in correct alignment for 3 weeks   Time 3   Period  Weeks   Status Achieved           PT Long Term Goals - 08/14/15 1317      PT LONG TERM GOAL #1   Title indpendent with HEP   Time 12   Period Weeks   Status On-going     PT LONG TERM GOAL #2   Title pain with walking decreased >/= 80%   Time 12   Period Weeks   Status On-going     PT LONG TERM GOAL #3   Title go up and down steps with a step over step pattern   Time 6   Status Deferred     PT LONG TERM GOAL #4   Title being able to last for 5 days without therapy and pain not going above a 8/10 due to decreased muscle soreness   Time 12   Period Weeks   Status Achieved           Long  Term Clinic Goals - 10/12/15 1238      CC Long Term Goal  #2   Title Pt. will report swelling is adequately managed to enable ADL function at a consistent level.   Status On-going     CC Long Term Goal  #4   Title Pain/discomfort at right axilla area will be controlled at 6/10 or less.   Status On-going     CC Long Term Goal  #5   Title Patient will avoid infection by ongoing management of her lymphedema at right breast/axilla/upper arm areas.   Status On-going            Plan - 10/22/15 1133    Clinical Impression Statement Patient was able to ambulate with less pain and had visibly less swelling present in her right hand after treatment.     Rehab Potential Good   Clinical Impairments Affecting Rehab Potential active cancer   PT Frequency 2x / week   PT Duration 12 weeks   PT Treatment/Interventions Manual techniques;Manual lymph drainage   PT Next Visit Plan Continue manual lymph drainage for right upper outer breast and for right UE; also for left rib pain; continue right thigh soft tissue work focused on medial right thigh with myofascial release.  Faxed demographics to [employer] [facility] per patient's request to have her fitted for a compression glove as she currently only has a gauntlet but has finger swellling.   PT Plan Continue manual lymph drainage, soft tissue work.      Patient will benefit from skilled therapeutic intervention in order to improve the following deficits and impairments:  Pain, Increased muscle spasms, Difficulty walking, Increased edema  Visit Diagnosis: Lymphedema, not elsewhere classified  Pain in right thigh  Other muscle spasm     Problem List Patient Active Problem List   Diagnosis Date Noted  . 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 Zoster 04/04/2015  . Nausea with vomiting 11/18/2014  . Constipation 11/18/2014  . Left-sided thoracic back pain   . Bone metastases ([place]) 11/16/2014  . Back pain 11/15/2014  . Uncontrolled  pain 11/14/2014  . 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 Chest wall pain 03/21/2014  . Abnormal LFTs (liver function tests) 09/12/2013  . Breast cancer of upper-inner quadrant of right female breast ([place]) 08/18/2013  . 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

## 2015-10-23 ENCOUNTER — Other Ambulatory Visit: Payer: Self-pay | Admitting: *Deleted

## 2015-10-23 ENCOUNTER — Encounter: Payer: Self-pay | Admitting: *Deleted

## 2015-10-23 LAB — CANCER ANTIGEN 27.29

## 2015-10-24 ENCOUNTER — Ambulatory Visit (HOSPITAL_BASED_OUTPATIENT_CLINIC_OR_DEPARTMENT_OTHER): Payer: 59

## 2015-10-24 VITALS — BP 124/95 | HR 94 | Temp 98.7°F | Resp 18

## 2015-10-24 DIAGNOSIS — Z5111 Encounter for antineoplastic chemotherapy: Secondary | ICD-10-CM | POA: Diagnosis not present

## 2015-10-24 DIAGNOSIS — C7951 Secondary malignant neoplasm of bone: Secondary | ICD-10-CM

## 2015-10-24 DIAGNOSIS — C771 Secondary and unspecified malignant neoplasm of intrathoracic lymph nodes: Secondary | ICD-10-CM | POA: Diagnosis not present

## 2015-10-24 DIAGNOSIS — C50211 Malignant neoplasm of upper-inner quadrant of right female breast: Secondary | ICD-10-CM | POA: Diagnosis not present

## 2015-10-24 MED ORDER — DENOSUMAB 120 MG/1.7ML ~~LOC~~ SOLN
120.0000 mg | Freq: Once | SUBCUTANEOUS | Status: AC
  Administered 2015-10-24: 120 mg via SUBCUTANEOUS
  Filled 2015-10-24: qty 1.7

## 2015-10-24 MED ORDER — FULVESTRANT 250 MG/5ML IM SOLN
500.0000 mg | Freq: Once | INTRAMUSCULAR | Status: AC
  Administered 2015-10-24: 500 mg via INTRAMUSCULAR
  Filled 2015-10-24: qty 10

## 2015-10-24 NOTE — Patient Instructions (Signed)
Denosumab injection What is this medicine? DENOSUMAB (den oh sue mab) slows bone breakdown. Prolia is used to treat osteoporosis in women after menopause and in men. Xgeva is used to prevent bone fractures and other bone problems caused by cancer bone metastases. Xgeva is also used to treat giant cell tumor of the bone. This medicine may be used for other purposes; ask your health care provider or pharmacist if you have questions. What should I tell my health care provider before I take this medicine? They need to know if you have any of these conditions: -dental disease -eczema -infection or history of infections -kidney disease or on dialysis -low blood calcium or vitamin D -malabsorption syndrome -scheduled to have surgery or tooth extraction -taking medicine that contains denosumab -thyroid or parathyroid disease -an unusual reaction to denosumab, other medicines, foods, dyes, or preservatives -pregnant or trying to get pregnant -breast-feeding How should I use this medicine? This medicine is for injection under the skin. It is given by a health care professional in a hospital or clinic setting. If you are getting Prolia, a special MedGuide will be given to you by the pharmacist with each prescription and refill. Be sure to read this information carefully each time. For Prolia, talk to your pediatrician regarding the use of this medicine in children. Special care may be needed. For Xgeva, talk to your pediatrician regarding the use of this medicine in children. While this drug may be prescribed for children as young as 13 years for selected conditions, precautions do apply. Overdosage: If you think you have taken too much of this medicine contact a poison control center or emergency room at once. NOTE: This medicine is only for you. Do not share this medicine with others. What if I miss a dose? It is important not to miss your dose. Call your doctor or health care professional if you are  unable to keep an appointment. What may interact with this medicine? Do not take this medicine with any of the following medications: -other medicines containing denosumab This medicine may also interact with the following medications: -medicines that suppress the immune system -medicines that treat cancer -steroid medicines like prednisone or cortisone This list may not describe all possible interactions. Give your health care provider a list of all the medicines, herbs, non-prescription drugs, or dietary supplements you use. Also tell them if you smoke, drink alcohol, or use illegal drugs. Some items may interact with your medicine. What should I watch for while using this medicine? Visit your doctor or health care professional for regular checks on your progress. Your doctor or health care professional may order blood tests and other tests to see how you are doing. Call your doctor or health care professional if you get a cold or other infection while receiving this medicine. Do not treat yourself. This medicine may decrease your body's ability to fight infection. You should make sure you get enough calcium and vitamin D while you are taking this medicine, unless your doctor tells you not to. Discuss the foods you eat and the vitamins you take with your health care professional. See your dentist regularly. Brush and floss your teeth as directed. Before you have any dental work done, tell your dentist you are receiving this medicine. Do not become pregnant while taking this medicine or for 5 months after stopping it. Women should inform their doctor if they wish to become pregnant or think they might be pregnant. There is a potential for serious side effects   to an unborn child. Talk to your health care professional or pharmacist for more information. What side effects may I notice from receiving this medicine? Side effects that you should report to your doctor or health care professional as soon as  possible: -allergic reactions like skin rash, itching or hives, swelling of the face, lips, or tongue -breathing problems -chest pain -fast, irregular heartbeat -feeling faint or lightheaded, falls -fever, chills, or any other sign of infection -muscle spasms, tightening, or twitches -numbness or tingling -skin blisters or bumps, or is dry, peels, or red -slow healing or unexplained pain in the mouth or jaw -unusual bleeding or bruising Side effects that usually do not require medical attention (Report these to your doctor or health care professional if they continue or are bothersome.): -muscle pain -stomach upset, gas This list may not describe all possible side effects. Call your doctor for medical advice about side effects. You may report side effects to FDA at 1-800-FDA-1088. Where should I keep my medicine? This medicine is only given in a clinic, doctor's office, or other health care setting and will not be stored at home. NOTE: This sheet is a summary. It may not cover all possible information. If you have questions about this medicine, talk to your doctor, pharmacist, or health care provider.    2016, Elsevier/Gold Standard. (2011-09-15 12:37:47) Fulvestrant injection What is this medicine? FULVESTRANT (ful VES trant) blocks the effects of estrogen. It is used to treat breast cancer. This medicine may be used for other purposes; ask your health care provider or pharmacist if you have questions. What should I tell my health care provider before I take this medicine? They need to know if you have any of these conditions: -bleeding problems -liver disease -low levels of platelets in the blood -an unusual or allergic reaction to fulvestrant, other medicines, foods, dyes, or preservatives -pregnant or trying to get pregnant -breast-feeding How should I use this medicine? This medicine is for injection into a muscle. It is usually given by a health care professional in a  hospital or clinic setting. Talk to your pediatrician regarding the use of this medicine in children. Special care may be needed. Overdosage: If you think you have taken too much of this medicine contact a poison control center or emergency room at once. NOTE: This medicine is only for you. Do not share this medicine with others. What if I miss a dose? It is important not to miss your dose. Call your doctor or health care professional if you are unable to keep an appointment. What may interact with this medicine? -medicines that treat or prevent blood clots like warfarin, enoxaparin, and dalteparin This list may not describe all possible interactions. Give your health care provider a list of all the medicines, herbs, non-prescription drugs, or dietary supplements you use. Also tell them if you smoke, drink alcohol, or use illegal drugs. Some items may interact with your medicine. What should I watch for while using this medicine? Your condition will be monitored carefully while you are receiving this medicine. You will need important blood work done while you are taking this medicine. Do not become pregnant while taking this medicine or for at least 1 year after stopping it. Women of child-bearing potential will need to have a negative pregnancy test before starting this medicine. Women should inform their doctor if they wish to become pregnant or think they might be pregnant. There is a potential for serious side effects to an unborn child. Men   should inform their doctors if they wish to father a child. This medicine may lower sperm counts. Talk to your health care professional or pharmacist for more information. Do not breast-feed an infant while taking this medicine or for 1 year after the last dose. What side effects may I notice from receiving this medicine? Side effects that you should report to your doctor or health care professional as soon as possible: -allergic reactions like skin rash,  itching or hives, swelling of the face, lips, or tongue -feeling faint or lightheaded, falls -pain, tingling, numbness, or weakness in the legs -signs and symptoms of infection like fever or chills; cough; flu-like symptoms; sore throat -vaginal bleeding Side effects that usually do not require medical attention (report to your doctor or health care professional if they continue or are bothersome): -aches, pains -constipation -diarrhea -headache -hot flashes -nausea, vomiting -pain at site where injected -stomach pain This list may not describe all possible side effects. Call your doctor for medical advice about side effects. You may report side effects to FDA at 1-800-FDA-1088. Where should I keep my medicine? This drug is given in a hospital or clinic and will not be stored at home. NOTE: This sheet is a summary. It may not cover all possible information. If you have questions about this medicine, talk to your doctor, pharmacist, or health care provider.    2016, Elsevier/Gold Standard. (2014-10-13 11:03:55)  

## 2015-10-25 ENCOUNTER — Ambulatory Visit: Payer: 59 | Admitting: Physical Therapy

## 2015-10-25 ENCOUNTER — Other Ambulatory Visit: Payer: Self-pay | Admitting: *Deleted

## 2015-10-25 DIAGNOSIS — I890 Lymphedema, not elsewhere classified: Secondary | ICD-10-CM | POA: Diagnosis not present

## 2015-10-25 DIAGNOSIS — M79651 Pain in right thigh: Secondary | ICD-10-CM

## 2015-10-25 NOTE — Therapy (Signed)
[place], [place], [address] Phone: [phone]   Fax:  [phone]  Physical Therapy Treatment  Patient Details  Name: [name] [name] MRN: [id number] Date of Birth: Jan 08, 1961 Referring Provider: Dr. Lurline Del  Encounter Date: 10/25/2015      PT End of Session - 10/25/15 1210    Visit Number 91  58 for lymph   Number of Visits 72  for lymph   Date for PT Re-Evaluation 12/10/15   PT Start Time 1015   PT Stop Time 1058   PT Time Calculation (min) 43 min   Activity Tolerance Patient tolerated treatment well   Behavior During Therapy [facility] for tasks assessed/performed      Past Medical History:  Diagnosis Date  . Bone metastases ([place]) dx'd 05/2014  . Breast cancer ([place]) dx'd 2005/2011  . Peripheral vascular disease ([place]) 02/2010   blood clot related to porta cath  . PONV (postoperative nausea and vomiting)   . S/P radiation therapy 07/17/2014 through 08/02/2014    Left mediastinum, left seventh rib 3250 cGy in 13 sessions   . S/P radiation therapy 12/11/2014 through 12/22/2014    Left parietal calvarium 2400 cGy in 8 sessions   . Seizures ([place]) 2010   Isolated incident.    Past Surgical History:  Procedure Laterality Date  . AXILLARY LYMPH NODE DISSECTION  Dec. 2011  . BREAST LUMPECTOMY  2005  . MEDIASTINOTOMY CHAMBERLAIN MCNEIL Left 06/02/2013   Procedure: MEDIASTINOTOMY CHAMBERLAIN MCNEIL;  Surgeon: [name], MD;  Location: [facility];  Service: Thoracic;  Laterality: Left;  LEFT ANTERIOR MEDIASTINOTOMY   . PORTACATH PLACEMENT  12/11  . removal portacath      There were no vitals filed for this visit.      Subjective Assessment - 10/25/15 1100    Subjective Pt got injections yesterday so is in discomfort and having  diffuculty walking. "I just want to get this fluid moving"    Currently in Pain? Yes   Pain Score 6    Pain Location --  generalized in arm lateral trunk and letg   Pain Orientation Right   Pain Descriptors / Indicators --  fullness                          [facility] Adult PT Treatment/Exercise - 10/25/15 0001      Manual Therapy   Myofascial Release to low back and sacral area    Manual Lymphatic Drainage (MLD) In left sidelying, posterior interaxillary anastomosis and right axillo-inguinal anastomosis; in supine, short neck, left axilla and anterior interaxillary anastomosis, right groin and axillo-inguinal anastomosis, and right upper extremity from fingers to shoulder.  In right sidelying, left periscapular area toward left groin.   Other Manual Therapy soft tissue work at superior and entire aspect of right medial thigh in right sidelying for pain relief and muscle relaxation, gentle myofascial release especially to hanstring area with prolonged pressure at tender trigger points                   PT Short Term Goals - 02/27/15 1011      PT SHORT TERM GOAL #1   Title pain with walking decreased >/= 25%   Time 3   Period Weeks   Status Achieved     PT SHORT TERM GOAL #2   Title pelvis stays in correct alignment for 3 weeks   Time 3   Period  Weeks   Status Achieved           PT Long Term Goals - 08/14/15 1317      PT LONG TERM GOAL #1   Title indpendent with HEP   Time 12   Period Weeks   Status On-going     PT LONG TERM GOAL #2   Title pain with walking decreased >/= 80%   Time 12   Period Weeks   Status On-going     PT LONG TERM GOAL #3   Title go up and down steps with a step over step pattern   Time 6   Status Deferred     PT LONG TERM GOAL #4   Title being able to last for 5 days without therapy and pain not going above a 8/10 due to decreased muscle soreness   Time 12   Period Weeks   Status Achieved           Long  Term Clinic Goals - 10/12/15 1238      CC Long Term Goal  #2   Title Pt. will report swelling is adequately managed to enable ADL function at a consistent level.   Status On-going     CC Long Term Goal  #4   Title Pain/discomfort at right axilla area will be controlled at 6/10 or less.   Status On-going     CC Long Term Goal  #5   Title Patient will avoid infection by ongoing management of her lymphedema at right breast/axilla/upper arm areas.   Status On-going            Plan - 10/25/15 1212    Clinical Impression Statement Pt reported symptomatic relief after treatment.  During treatment the fullness of the tissue in her back, chest, hand andn right thigh was visibly and palpably reduced, so she is responding to treatment and sustainaing relief for a few days to keep it under control so she can continue to function    Rehab Potential Good   Clinical Impairments Affecting Rehab Potential active cancer   PT Next Visit Plan Continue manual lymph drainage for right upper outer breast and for right UE; also for left rib pain; continue right thigh soft tissue work focused on medial right thigh with myofascial release   PT Plan Continue manual lymph drainage, soft tissue work.      Patient will benefit from skilled therapeutic intervention in order to improve the following deficits and impairments:  Pain, Increased muscle spasms, Difficulty walking, Increased edema  Visit Diagnosis: Lymphedema, not elsewhere classified  Pain in right thigh     Problem List Patient Active Problem List   Diagnosis Date Noted  . 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 Zoster 04/04/2015  . Nausea with vomiting 11/18/2014  . Constipation 11/18/2014  . Left-sided thoracic back pain   . Bone metastases ([place]) 11/16/2014  . Back pain 11/15/2014  . Uncontrolled pain 11/14/2014  . 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 Chest wall pain 03/21/2014  . Abnormal LFTs (liver  function tests) 09/12/2013  . Breast cancer of upper-inner quadrant of right female breast ([place]) 08/18/2013  . 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 Owens Shark PT  [name] 10/25/2015, 12:16 PM  [place], [place], [address] Phone: [phone]   Fax:  [phone]  Name: [name] [name] MRN: [id number] Date of Birth: October 23, 1960

## 2015-10-29 ENCOUNTER — Encounter: Payer: Self-pay | Admitting: Oncology

## 2015-10-29 ENCOUNTER — Ambulatory Visit: Payer: 59 | Admitting: Physical Therapy

## 2015-10-29 ENCOUNTER — Other Ambulatory Visit (HOSPITAL_BASED_OUTPATIENT_CLINIC_OR_DEPARTMENT_OTHER): Payer: 59

## 2015-10-29 DIAGNOSIS — M79651 Pain in right thigh: Secondary | ICD-10-CM

## 2015-10-29 DIAGNOSIS — C50211 Malignant neoplasm of upper-inner quadrant of right female breast: Secondary | ICD-10-CM

## 2015-10-29 DIAGNOSIS — I890 Lymphedema, not elsewhere classified: Secondary | ICD-10-CM | POA: Diagnosis not present

## 2015-10-29 DIAGNOSIS — C771 Secondary and unspecified malignant neoplasm of intrathoracic lymph nodes: Secondary | ICD-10-CM | POA: Diagnosis not present

## 2015-10-29 DIAGNOSIS — J90 Pleural effusion, not elsewhere classified: Secondary | ICD-10-CM

## 2015-10-29 LAB — CBC WITH DIFFERENTIAL/PLATELET
BASO%: 1.7 % (ref 0.0–2.0)
BASOS ABS: 0.1 10*3/uL (ref 0.0–0.1)
EOS ABS: 0 10*3/uL (ref 0.0–0.5)
EOS%: 1.4 % (ref 0.0–7.0)
HCT: 34 % — ABNORMAL LOW (ref 34.8–46.6)
HEMOGLOBIN: 12.4 g/dL (ref 11.6–15.9)
LYMPH#: 0.8 10*3/uL — AB (ref 0.9–3.3)
LYMPH%: 28 % (ref 14.0–49.7)
MCH: 36.5 pg — AB (ref 25.1–34.0)
MCHC: 36.5 g/dL — ABNORMAL HIGH (ref 31.5–36.0)
MCV: 100 fL (ref 79.5–101.0)
MONO#: 0.2 10*3/uL (ref 0.1–0.9)
MONO%: 6.6 % (ref 0.0–14.0)
NEUT#: 1.8 10*3/uL (ref 1.5–6.5)
NEUT%: 62.3 % (ref 38.4–76.8)
NRBC: 0 % (ref 0–0)
PLATELETS: 191 10*3/uL (ref 145–400)
RBC: 3.4 10*6/uL — ABNORMAL LOW (ref 3.70–5.45)
RDW: 13.8 % (ref 11.2–14.5)
WBC: 2.9 10*3/uL — ABNORMAL LOW (ref 3.9–10.3)

## 2015-10-29 NOTE — Progress Notes (Signed)
See prev notes. Dr. Jana Hakim to ck on alternatives. Her insurance will not cover aciphex sprinkle

## 2015-10-29 NOTE — Therapy (Signed)
[place], [place], [address] Phone: [phone]   Fax:  [phone]  Physical Therapy Treatment  Patient Details  Name: [name] [name] MRN: [id number] Date of Birth: [birth year] Referring Provider: Dr. Lurline Del  Encounter Date: 10/29/2015      PT End of Session - 10/29/15 2145    Visit Number 92  59 for lymph   Number of Visits 72  for lymph   Date for PT Re-Evaluation 12/10/15   Authorization - Visit Number 140   PT Start Time 1019   PT Stop Time 1102   PT Time Calculation (min) 43 min   Activity Tolerance Patient tolerated treatment well   Behavior During Therapy [facility] for tasks assessed/performed      Past Medical History:  Diagnosis Date  . Bone metastases ([place]) dx'd 05/2014  . Breast cancer ([place]) dx'd 2005/2011  . Peripheral vascular disease ([place]) 02/2010   blood clot related to porta cath  . PONV (postoperative nausea and vomiting)   . S/P radiation therapy 07/17/2014 through 08/02/2014    Left mediastinum, left seventh rib 3250 cGy in 13 sessions   . S/P radiation therapy 12/11/2014 through 12/22/2014    Left parietal calvarium 2400 cGy in 8 sessions   . Seizures ([place]) 2010   Isolated incident.    Past Surgical History:  Procedure Laterality Date  . AXILLARY LYMPH NODE DISSECTION  Dec. 2011  . BREAST LUMPECTOMY  2005  . MEDIASTINOTOMY CHAMBERLAIN MCNEIL Left 06/02/2013   Procedure: MEDIASTINOTOMY CHAMBERLAIN MCNEIL;  Surgeon: [name], MD;  Location: [place];  Service: Thoracic;  Laterality: Left;  LEFT ANTERIOR MEDIASTINOTOMY   . PORTACATH PLACEMENT  12/11  . removal portacath      There were no vitals filed for this visit.      Subjective Assessment - 10/29/15 1021    Subjective Walked some in the park  both Saturday and Sunday.  Hand is only mildly swollen.  Right upper outer breast feels very firm.   Currently in Pain? Yes   Pain Score 6    Pain Location Axilla   Pain Orientation Right   Pain Type Chronic pain   Aggravating Factors  high humidity, more swelling   Pain Relieving Factors therapys   Pain Score 3   Pain Location Groin   Pain Orientation Right   Aggravating Factors  more walking   Pain Relieving Factors therapy                         [facility] Adult PT Treatment/Exercise - 10/29/15 0001      Manual Therapy   Myofascial Release right axilla with focus on scar tightness   Manual Lymphatic Drainage (MLD) In left sidelying, posterior interaxillary anastomosis and right axillo-inguinal anastomosis; in supine, short neck, left axilla and anterior interaxillary anastomosis, right groin and axillo-inguinal anastomosis, area between right breast scars, directing toward pathways.  In right sidelying, left periscapular area toward left groin.   Other Manual Therapy soft tissue work at superior and entire aspect of right medial thigh in right sidelying for pain relief and muscle relaxation, gentle myofascial release                  PT Short Term Goals - 02/27/15 1011      PT SHORT TERM GOAL #1   Title pain with walking decreased >/= 25%   Time 3   Period Weeks   Status  Achieved     PT SHORT TERM GOAL #2   Title pelvis stays in correct alignment for 3 weeks   Time 3   Period Weeks   Status Achieved           PT Long Term Goals - 08/14/15 1317      PT LONG TERM GOAL #1   Title indpendent with HEP   Time 12   Period Weeks   Status On-going     PT LONG TERM GOAL #2   Title pain with walking decreased >/= 80%   Time 12   Period Weeks   Status On-going     PT LONG TERM GOAL #3   Title go up and down steps with a step over step pattern   Time 6   Status Deferred     PT LONG TERM GOAL #4   Title being able to last for 5 days without  therapy and pain not going above a 8/10 due to decreased muscle soreness   Time 12   Period Weeks   Status Achieved           Long Term Clinic Goals - 10/12/15 1238      CC Long Term Goal  #2   Title Pt. will report swelling is adequately managed to enable ADL function at a consistent level.   Status On-going     CC Long Term Goal  #4   Title Pain/discomfort at right axilla area will be controlled at 6/10 or less.   Status On-going     CC Long Term Goal  #5   Title Patient will avoid infection by ongoing management of her lymphedema at right breast/axilla/upper arm areas.   Status On-going            Plan - 10/29/15 2146    Clinical Impression Statement Patient continues to come in with induration at right upper outer breast, and this softens and appears to reduce during sesison.  Right groin and thigh discomfort is fairly well under control with soft tissue work to manage flareups that occur, for example, when she tries to increase walking time.   Rehab Potential Good   PT Frequency 2x / week   PT Duration 12 weeks   PT Treatment/Interventions Manual techniques;Manual lymph drainage   PT Next Visit Plan Continue manual lymph drainage for right upper outer breast and for right UE; also for left rib pain; continue right thigh soft tissue work focused on medial right thigh with myofascial release   Consulted and Agree with Plan of Care Patient   PT Plan Continue manual lymph drainage, soft tissue work.      Patient will benefit from skilled therapeutic intervention in order to improve the following deficits and impairments:  Pain, Increased muscle spasms, Difficulty walking, Increased edema  Visit Diagnosis: Lymphedema, not elsewhere classified  Pain in right thigh     Problem List Patient Active Problem List   Diagnosis Date Noted  . 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 Zoster 04/04/2015  . Nausea with vomiting 11/18/2014  . Constipation 11/18/2014   . Left-sided thoracic back pain   . Bone metastases ([place]) 11/16/2014  . Back pain 11/15/2014  . Uncontrolled pain 11/14/2014  . 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 Chest wall pain 03/21/2014  . Abnormal LFTs (liver function tests) 09/12/2013  . Breast cancer of upper-inner quadrant of right female breast ([place]) 08/18/2013  . 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

## 2015-11-02 ENCOUNTER — Ambulatory Visit: Payer: 59 | Attending: Oncology | Admitting: Physical Therapy

## 2015-11-02 DIAGNOSIS — I890 Lymphedema, not elsewhere classified: Secondary | ICD-10-CM

## 2015-11-02 DIAGNOSIS — M79651 Pain in right thigh: Secondary | ICD-10-CM

## 2015-11-02 NOTE — Therapy (Signed)
[place], [place], [address] Phone: [phone]   Fax:  [phone]  Physical Therapy Treatment  Patient Details  Name: [name] [name] MRN: [id number] Date of Birth: 1960/12/18 Referring Provider: Dr. Lurline Del  Encounter Date: 11/02/2015      PT End of Session - 11/02/15 1222    Visit Number 71  60 for lymph   Number of Visits 56  for lymph   Date for PT Re-Evaluation 12/10/15   Authorization - Visit Number 140   PT Start Time [id number]   PT Stop Time 1105   PT Time Calculation (min) 42 min   Activity Tolerance Patient tolerated treatment well   Behavior During Therapy [facility] for tasks assessed/performed      Past Medical History:  Diagnosis Date  . Bone metastases ([place]) dx'd 05/2014  . Breast cancer ([place]) dx'd 2005/2011  . Peripheral vascular disease ([place]) 02/2010   blood clot related to porta cath  . PONV (postoperative nausea and vomiting)   . S/P radiation therapy 07/17/2014 through 08/02/2014    Left mediastinum, left seventh rib 3250 cGy in 13 sessions   . S/P radiation therapy 12/11/2014 through 12/22/2014    Left parietal calvarium 2400 cGy in 8 sessions   . Seizures ([place]) 2010   Isolated incident.    Past Surgical History:  Procedure Laterality Date  . AXILLARY LYMPH NODE DISSECTION  Dec. 2011  . BREAST LUMPECTOMY  2005  . MEDIASTINOTOMY CHAMBERLAIN MCNEIL Left 06/02/2013   Procedure: MEDIASTINOTOMY CHAMBERLAIN MCNEIL;  Surgeon: [name], MD;  Location: [place];  Service: Thoracic;  Laterality: Left;  LEFT ANTERIOR MEDIASTINOTOMY   . PORTACATH PLACEMENT  12/11  . removal portacath      There were no vitals filed for this visit.      Subjective Assessment - 11/02/15 1026    Subjective (P)  hand is bothering  around the knuckles--hurting; tried the Flexitouch, but traded one pain for another, and that helped but just temporarily   Currently in Pain? (P)  Yes   Pain Score (P)  7    Pain Location (P)  Axilla   Pain Orientation (P)  Right   Aggravating Factors  (P)  more swelling   Pain Relieving Factors (P)  therapy   Pain Score (P)  2   Pain Location (P)  Groin   Pain Orientation (P)  Right   Aggravating Factors  (P)  more walking   Pain Relieving Factors (P)  therapy                         [facility] Adult PT Treatment/Exercise - 11/02/15 0001      Manual Therapy   Myofascial Release right axilla with focus on scar tightness   Manual Lymphatic Drainage (MLD) In left sidelying, posterior interaxillary anastomosis and right axillo-inguinal anastomosis; in supine, short neck, left axilla and anterior interaxillary anastomosis, right groin and axillo-inguinal anastomosis, and right upper extremity from fingers to shoulder.  In right sidelying, left periscapular area toward left groin.   Other Manual Therapy soft tissue work at superior and entire aspect of right medial thigh in right sidelying for pain relief and muscle relaxation                  PT Short Term Goals - 02/27/15 1011      PT SHORT TERM GOAL #1   Title pain with walking decreased >/= 25%  Time 3   Period Weeks   Status Achieved     PT SHORT TERM GOAL #2   Title pelvis stays in correct alignment for 3 weeks   Time 3   Period Weeks   Status Achieved           PT Long Term Goals - 08/14/15 1317      PT LONG TERM GOAL #1   Title indpendent with HEP   Time 12   Period Weeks   Status On-going     PT LONG TERM GOAL #2   Title pain with walking decreased >/= 80%   Time 12   Period Weeks   Status On-going     PT LONG TERM GOAL #3   Title go up and down steps with a step over step pattern   Time 6   Status Deferred     PT LONG TERM GOAL #4   Title being able to last for 5 days without  therapy and pain not going above a 8/10 due to decreased muscle soreness   Time 12   Period Weeks   Status Achieved           Long Term Clinic Goals - 11/02/15 1225      CC Long Term Goal  #2   Title Pt. will report swelling is adequately managed to enable ADL function at a consistent level.   Status On-going     CC Long Term Goal  #4   Title Pain/discomfort at right axilla area will be controlled at 6/10 or less.   Status On-going     CC Long Term Goal  #5   Title Patient will avoid infection by ongoing management of her lymphedema at right breast/axilla/upper arm areas.   Status On-going            Plan - 11/02/15 1223    Clinical Impression Statement Right UE including hands have increased swelling; therapy continues to help her.   Rehab Potential Good   Clinical Impairments Affecting Rehab Potential active cancer   PT Frequency 2x / week   PT Duration 12 weeks   PT Treatment/Interventions Manual techniques;Manual lymph drainage   PT Next Visit Plan Continue manual lymph drainage for right upper outer breast and for right UE; also for left rib pain; continue right thigh soft tissue work focused on medial right thigh with myofascial release   Consulted and Agree with Plan of Care Patient   PT Plan Continue manual lymph drainage, soft tissue work.      Patient will benefit from skilled therapeutic intervention in order to improve the following deficits and impairments:  Pain, Increased muscle spasms, Difficulty walking, Increased edema  Visit Diagnosis: Lymphedema, not elsewhere classified  Pain in right thigh     Problem List Patient Active Problem List   Diagnosis Date Noted  . 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 Zoster 04/04/2015  . Nausea with vomiting 11/18/2014  . Constipation 11/18/2014  . Left-sided thoracic back pain   . Bone metastases ([place]) 11/16/2014  . Back pain 11/15/2014  . Uncontrolled pain 11/14/2014  . Post-lymphadenectomy  lymphedema of arm 05/31/2014  . Chest wall pain 03/21/2014  . Abnormal LFTs (liver function tests) 09/12/2013  . Breast cancer of upper-inner quadrant of right female breast ([place]) 08/18/2013  . 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

## 2015-11-05 ENCOUNTER — Other Ambulatory Visit: Payer: 59

## 2015-11-05 ENCOUNTER — Ambulatory Visit: Payer: 59 | Admitting: Physical Therapy

## 2015-11-05 DIAGNOSIS — M79651 Pain in right thigh: Secondary | ICD-10-CM

## 2015-11-05 DIAGNOSIS — I890 Lymphedema, not elsewhere classified: Secondary | ICD-10-CM | POA: Diagnosis not present

## 2015-11-05 NOTE — Therapy (Signed)
[place], [place], [address] Phone: [phone]   Fax:  [phone]  Physical Therapy Treatment  Patient Details  Name: [name] [name] MRN: [id number] Date of Birth: 05/17/1960 Referring Provider: Dr. Lurline [name]  Encounter Date: 11/05/2015      PT End of Session - 11/05/15 2052    Visit Number [id number] for lymph   Number of Visits 43  for lymph   Date for PT Re-Evaluation 12/10/15   Authorization - Visit Number 140   PT Start Time [id number]   PT Stop Time 1105   PT Time Calculation (min) 42 min   Activity Tolerance Patient tolerated treatment well   Behavior During Therapy [name] [name] for tasks assessed/performed      Past Medical History:  Diagnosis Date  . Bone metastases ([place]) dx'd 05/2014  . Breast cancer ([place]) dx'd 2005/2011  . Peripheral vascular disease ([place]) 02/2010   blood clot related to porta cath  . PONV (postoperative nausea and vomiting)   . S/P radiation therapy 07/17/2014 through 08/02/2014    Left mediastinum, left seventh rib 3250 cGy in 13 sessions   . S/P radiation therapy 12/11/2014 through 12/22/2014    Left parietal calvarium 2400 cGy in 8 sessions   . Seizures ([place]) 2010   Isolated incident.    Past Surgical History:  Procedure Laterality Date  . AXILLARY LYMPH NODE DISSECTION  Dec. 2011  . BREAST LUMPECTOMY  2005  . MEDIASTINOTOMY CHAMBERLAIN MCNEIL Left 06/02/2013   Procedure: MEDIASTINOTOMY CHAMBERLAIN MCNEIL;  Surgeon: [name] Nakayama, MD;  Location: [facility];  Service: Thoracic;  Laterality: Left;  LEFT ANTERIOR MEDIASTINOTOMY   . PORTACATH PLACEMENT  12/11  . removal portacath      There were no vitals filed for this visit.      Subjective Assessment - 11/05/15 1027    Subjective Had a bad day Saturday  (getting depressed), but came out of it Sunday.  Hand hurts more, is more swollen with the humid weather.   Currently in Pain? Yes   Pain Score 6    Pain Location Axilla   Pain Orientation Right   Aggravating Factors  high humidity   Pain Relieving Factors therapy   Pain Score 3   Pain Location Groin   Pain Orientation Right   Aggravating Factors  more walking   Pain Relieving Factors therapy                         [facility] Adult PT Treatment/Exercise - 11/05/15 0001      Manual Therapy   Myofascial Release right axilla with focus on scar tightness   Manual Lymphatic Drainage (MLD) In left sidelying, posterior interaxillary anastomosis and right axillo-inguinal anastomosis; in supine, short neck, left axilla and anterior interaxillary anastomosis, right groin and axillo-inguinal anastomosis, and right upper extremity from fingers to shoulder.  In right sidelying, left periscapular area toward left groin.   Other Manual Therapy soft tissue work at superior and entire aspect of right medial thigh in right sidelying for pain relief and muscle relaxation                  PT Short Term Goals - 02/27/15 1011      PT SHORT TERM GOAL #1   Title pain with walking decreased >/= 25%   Time 3   Period Weeks   Status Achieved     PT SHORT TERM GOAL #2  Title pelvis stays in correct alignment for 3 weeks   Time 3   Period Weeks   Status Achieved           PT Long Term Goals - 08/14/15 1317      PT LONG TERM GOAL #1   Title indpendent with HEP   Time 12   Period Weeks   Status On-going     PT LONG TERM GOAL #2   Title pain with walking decreased >/= 80%   Time 12   Period Weeks   Status On-going     PT LONG TERM GOAL #3   Title go up and down steps with a step over step pattern   Time 6   Status Deferred     PT LONG TERM GOAL #4   Title being able to last for 5 days without therapy and pain not going above a 8/10 due to decreased muscle  soreness   Time 12   Period Weeks   Status Achieved           Long Term Clinic Goals - 11/02/15 1225      CC Long Term Goal  #2   Title Pt. will report swelling is adequately managed to enable ADL function at a consistent level.   Status On-going     CC Long Term Goal  #4   Title Pain/discomfort at right axilla area will be controlled at 6/10 or less.   Status On-going     CC Long Term Goal  #5   Title Patient will avoid infection by ongoing management of her lymphedema at right breast/axilla/upper arm areas.   Status On-going            Plan - 11/05/15 2053    Clinical Impression Statement Still with fullness at right hand, particularly between MCP joints; other chronic swelling at right upper outer breast and groin pain continue, but continue to be helped by PT.   Rehab Potential Good   Clinical Impairments Affecting Rehab Potential active cancer   PT Frequency 2x / week   PT Duration 12 weeks   PT Treatment/Interventions Manual techniques;Manual lymph drainage   PT Next Visit Plan Continue manual lymph drainage for right upper outer breast and for right UE; also for left rib pain; continue right thigh soft tissue work focused on medial right thigh with myofascial release   Consulted and Agree with Plan of Care Patient   PT Plan Continue manual lymph drainage, soft tissue work.      Patient will benefit from skilled therapeutic intervention in order to improve the following deficits and impairments:  Pain, Increased muscle spasms, Difficulty walking, Increased edema  Visit Diagnosis: Lymphedema, not elsewhere classified  Pain in right thigh     Problem List Patient Active Problem List   Diagnosis Date Noted  . 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 Zoster 04/04/2015  . Nausea with vomiting 11/18/2014  . Constipation 11/18/2014  . Left-sided thoracic back pain   . Bone metastases ([place]) 11/16/2014  . Back pain 11/15/2014  . Uncontrolled pain  11/14/2014  . 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 Chest wall pain 03/21/2014  . Abnormal LFTs (liver function tests) 09/12/2013  . Breast cancer of upper-inner quadrant of right female breast ([place]) 08/18/2013  . 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

## 2015-11-06 ENCOUNTER — Ambulatory Visit (HOSPITAL_BASED_OUTPATIENT_CLINIC_OR_DEPARTMENT_OTHER): Payer: 59

## 2015-11-06 DIAGNOSIS — C50211 Malignant neoplasm of upper-inner quadrant of right female breast: Secondary | ICD-10-CM

## 2015-11-06 DIAGNOSIS — C771 Secondary and unspecified malignant neoplasm of intrathoracic lymph nodes: Secondary | ICD-10-CM

## 2015-11-06 DIAGNOSIS — J90 Pleural effusion, not elsewhere classified: Secondary | ICD-10-CM

## 2015-11-06 LAB — CBC WITH DIFFERENTIAL/PLATELET
BASO%: 1.9 % (ref 0.0–2.0)
Basophils Absolute: 0.1 10*3/uL (ref 0.0–0.1)
EOS%: 1.6 % (ref 0.0–7.0)
Eosinophils Absolute: 0.1 10*3/uL (ref 0.0–0.5)
HEMATOCRIT: 32.9 % — AB (ref 34.8–46.6)
HEMOGLOBIN: 11.8 g/dL (ref 11.6–15.9)
LYMPH#: 0.8 10*3/uL — AB (ref 0.9–3.3)
LYMPH%: 25.1 % (ref 14.0–49.7)
MCH: 36.1 pg — ABNORMAL HIGH (ref 25.1–34.0)
MCHC: 35.9 g/dL (ref 31.5–36.0)
MCV: 100.6 fL (ref 79.5–101.0)
MONO#: 0.6 10*3/uL (ref 0.1–0.9)
MONO%: 20 % — ABNORMAL HIGH (ref 0.0–14.0)
NEUT%: 51.4 % (ref 38.4–76.8)
NEUTROS ABS: 1.6 10*3/uL (ref 1.5–6.5)
Platelets: 231 10*3/uL (ref 145–400)
RBC: 3.27 10*6/uL — ABNORMAL LOW (ref 3.70–5.45)
RDW: 14.4 % (ref 11.2–14.5)
WBC: 3.2 10*3/uL — AB (ref 3.9–10.3)
nRBC: 0 % (ref 0–0)

## 2015-11-07 ENCOUNTER — Ambulatory Visit (HOSPITAL_BASED_OUTPATIENT_CLINIC_OR_DEPARTMENT_OTHER): Payer: 59 | Admitting: Oncology

## 2015-11-07 DIAGNOSIS — C771 Secondary and unspecified malignant neoplasm of intrathoracic lymph nodes: Secondary | ICD-10-CM

## 2015-11-07 DIAGNOSIS — C7951 Secondary malignant neoplasm of bone: Secondary | ICD-10-CM

## 2015-11-07 DIAGNOSIS — C50211 Malignant neoplasm of upper-inner quadrant of right female breast: Secondary | ICD-10-CM | POA: Diagnosis not present

## 2015-11-07 NOTE — Progress Notes (Signed)
[facility]  [phone] [phone]     ID: [name] OB: [birth year]  MR#: [id number]  VEL#:[id number]  PCP: Pcp Not In System GYN:  [name] SU:  OTHER MD: [name], [name], [name], [name], [name]  CHIEF COMPLAINT: Stage IV breast cancer  CURRENT TREATMENT: Fulvestrant, denosumab, palbociclib  BREAST CANCER HISTORY: From doctor [name]'s intake note 03/20/2004:  "The patient is a very pleasant 55 year old female, without significant past medical history.  Her family history is significant for a sister who at age 62 was diagnosed with invasive ductal carcinoma.  She is a breast cancer survivor at age 47 now.  The patient states that she has never really had a screening mammogram until October 2005, when she felt that it was time for her to start having mammograms done on a yearly basis.  Therefore, on 01/26/04, she underwent a screening mammogram and an abnormality was detected in the upper outer right breast.  She, therefore, underwent spot compression views of both the right and the left breast.  The left breast revealed a well-defined mass in the upper outer left quadrant, present at the 2 o'clock position, measuring 1.8 cm, 6 cm from the nipple.  This, by ultrasound, was felt to be a simple cyst measuring 1.8 cm.  On the right breast, a spiculated mass was noted in the upper outer right quadrant.  The ultrasound revealed a shadowing irregular solid mass at the 10:30 position, 9 cm from the nipple, measuring 1.2 cm in greatest dimension, correlating with the spiculated mass seen on the mammogram.  The right axilla was negative ultrasonically.  Because of this, the patient underwent a needle biopsy of the right breast and the biopsy was positive invasive mammary carcinoma that showed features consistent with a high-grade invasive ductal carcinoma associated with desmoplastic stroma.  No in  situ component was seen and no definite lymphovascular invasion was identified.  On the core biopsy, the tumor measured about 0.8 cm.  Because of this, she was seen by Dr. [name] and the patient was taken to the [facility] on March 15, 2004.  She underwent a right breast lumpectomy with sentinel node biopsy.  The final pathology revealed an invasive ductal carcinoma, measuring 1.7 cm, grade 2 of 3.  Margins were free of tumor.  Atypical lobular hyperplasia was noted.  One sentinel node was removed which was negative for metastatic disease.  The tumor was staged at T1c, N0 MX.  It was estrogen receptor positive, progesterone receptor positive.  HER-2/neu was 2+.  FISH was negative.  All margins were free of tumor.  She is now seen in Medical Oncology for further evaluation and management of this newly diagnosed T1c, node negative, stage I, invasive ductal carcinoma of the right breast."  Her subsequent history is as detailed below  INTERVAL HISTORY: Lysha returns today accompanied by her husband [name] for follow-up of her stage IV estrogen receptor positive breast cancer. She has now completed 2 months of full dose palbociclib. Of course she continues on fulvestrant monthly. She also receives denosumab/Xgeva every month.  REVIEW OF SYiSTEMS: Nayara has had excellent counts and has not needed any dose reductions. Incidentally her liver function tests also have been normal she has had some increase in her right upper extremity swelling, particularly noticeable in her hand. Partly this is due to weather and heat. She has had some epistaxis, and more headaches. She describes these as migraine-like. They  tend to occur on the third week of the eye brands. Aleve helps. Distance to localize more to her right temple area. She is having more leg cramps at night. These involve the feet and. This makes her get out of bed and have to stand up for a while. Sometimes she feels "clammy", more like Moist warmth on the  face, which is uncomfortable. She continues to have these to her family with the bowel problems. The pain from the fulvestrant shots can be very variable. She does think this is Designer, fashion/clothing dependent". She has some allergy symptoms which were slightly improved with tonic water. Aside from these issues a detailed review of systems today was stable.  PAST MEDICAL HISTORY: Past Medical History:  Diagnosis Date  . Bone metastases ([place]) dx'd 05/2014  . Breast cancer ([facility] [place]) dx'd 2005/2011  . Peripheral vascular disease ([place]) 02/2010   blood clot related to porta cath  . PONV (postoperative nausea and vomiting)   . S/P radiation therapy 07/17/2014 through 08/02/2014    Left mediastinum, left seventh rib 3250 cGy in 13 sessions   . S/P radiation therapy 12/11/2014 through 12/22/2014    Left parietal calvarium 2400 cGy in 8 sessions   . Seizures ([place]) 2010   Isolated incident.    PAST SURGICAL HISTORY: Past Surgical History:  Procedure Laterality Date  . AXILLARY LYMPH NODE DISSECTION  Dec. 2011  . BREAST LUMPECTOMY  2005  . MEDIASTINOTOMY CHAMBERLAIN MCNEIL Left 06/02/2013   Procedure: MEDIASTINOTOMY CHAMBERLAIN MCNEIL;  Surgeon: [name], MD;  Location: [facility];  Service: Thoracic;  Laterality: Left;  LEFT ANTERIOR MEDIASTINOTOMY   . PORTACATH PLACEMENT  12/11  . removal portacath      FAMILY HISTORY Family History  Problem Relation Age of Onset  . COPD Mother   . Breast cancer Sister 73   The patient's father is living, 67 years old as of may 2015. He lives in [place]. The patient's mother died from complications of COPD at the age of 77. These has 2 brothers, one sister. Her sister developed breast cancer at the age of 21. She is doing well. The patient herself underwent genetic testing at [facility] in 2011 and was found to be BRCA  negative  GYNECOLOGIC HISTORY:  Menarche age 65, she is GX P0. She stopped having periods with her initial chemotherapy in 2006.  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 Her husband [name] is a Medical illustrator in [place]. He has a child from a prior marriage. At home they have 2 rescue dogs, Hobo and [place]. The patient is religious but not a church attender    ADVANCED DIRECTIVES: In place; at the 08/04/2014 visit in particular the patient was very clear, with her husband present, that she would not want any kind of feeding tubes or "other tubes" if her condition deteriorated.   HEALTH MAINTENANCE: Social History  Substance Use Topics  . Smoking status: Never Smoker  . Smokeless tobacco: Never Used  . Alcohol use No     Colonoscopy:  PAP:  Bone density: March 2015; mild osteopenia  Lipid panel:  Allergies  Allergen Reactions  . 2nd Skin Quick Heal Other (See Comments)    Other Reaction: Skin peels  . Decadron [Dexamethasone] Other (See Comments)    Patient does not tolerate steroids.   . Dilaudid [Hydromorphone] Nausea And Vomiting  . Enoxaparin Other (See Comments)    unknown  .  Fluconazole Swelling    Liver toxicity  . Hydromorphone Hcl Nausea And Vomiting  . Morphine And Related Nausea And Vomiting  . Protonix [Pantoprazole Sodium] Other (See Comments)    Patient reports it caused thrush.  . Tegaderm Ag Mesh [Silver]     Current Outpatient Prescriptions  Medication Sig Dispense Refill  . ALPRAZolam (XANAX) 0.5 MG tablet Take 1 tablet (0.5 mg total) by mouth 2 (two) times daily as needed for anxiety. 30 tablet 0  . B Complex-C (B-COMPLEX WITH VITAMIN C) tablet Take 1 tablet by mouth daily. Reported on 03/27/2015    . calcium carbonate (TUMS - DOSED IN MG ELEMENTAL CALCIUM) 500 MG chewable tablet Chew 1 tablet by mouth as directed.    . cholecalciferol 2000 UNITS tablet Take 1 tablet (2,000  Units total) by mouth daily.    . Diphenhyd-Hydrocort-Nystatin (FIRST-DUKES MOUTHWASH) SUSP 5-10 ml qid SWISH AND SPIT 427 mL 3  . folic acid (FOLVITE) 1 MG tablet Take 1 tablet (1 mg total) by mouth daily.    . Melatonin 3 MG TABS Take 3 mg by mouth at bedtime.    . naproxen sodium (ANAPROX) 220 MG tablet Take 220 mg by mouth 2 (two) times daily with a meal.    . palbociclib (IBRANCE) 75 MG capsule Take 1 capsule (75 mg total) by mouth daily with breakfast. Take whole with food. 21 capsule 6  . RABEprazole Sodium 5 MG CPSP Take 5 mg by mouth daily. 30 capsule 3  . saccharomyces boulardii (FLORASTOR) 250 MG capsule Take 250 mg by mouth daily.      No current facility-administered medications for this visit.     OBJECTIVE: Middle-aged white womanWho appears stated age  55:     There is no height or weight on file to calculate BMI.   Vitals:     Patient refused vitals today 11/07/2015     ECOG FS: 1  Sclerae unicteric, EOMs intact Oropharynx clear and moist No cervical or supraclavicular adenopathy Lungs no rales or rhonchi Heart regular rate and rhythm Abd soft, nontender, positive bowel sounds MSK no focal spinal tenderness, minimal right upper extremity lymphedema without erythema  Neuro: nonfocal, well oriented, appropriate affect Breasts: Deferred     LAB RESULTS:   CMP     Component Value Date/Time   NA 136 10/22/2015 1149   K 3.8 10/22/2015 1149   CL 103 12/14/2014 0800   CL 105 05/06/2012 1333   CO2 25 10/22/2015 1149   GLUCOSE 73 10/22/2015 1149   GLUCOSE 124 (H) 05/06/2012 1333   BUN 10.9 10/22/2015 1149   CREATININE 0.9 10/22/2015 1149   CALCIUM 9.6 10/22/2015 1149   PROT 7.9 10/22/2015 1149   ALBUMIN 4.2 10/22/2015 1149   AST 27 10/22/2015 1149   ALT 30 10/22/2015 1149   ALKPHOS 63 10/22/2015 1149   BILITOT 0.36 10/22/2015 1149   GFRNONAA >60 12/14/2014 0800   GFRAA >60 12/14/2014 0800    No results found for: SPEP  Lab Results  Component  Value Date   WBC 3.2 (L) 11/06/2015   NEUTROABS 1.6 11/06/2015   HGB 11.8 11/06/2015   HCT 32.9 (L) 11/06/2015   MCV 100.6 11/06/2015   PLT 231 11/06/2015      Chemistry      Component Value Date/Time   NA 136 10/22/2015 1149   K 3.8 10/22/2015 1149   CL 103 12/14/2014 0800   CL 105 05/06/2012 1333   CO2 25 10/22/2015 1149   BUN  10.9 10/22/2015 1149   CREATININE 0.9 10/22/2015 1149      Component Value Date/Time   CALCIUM 9.6 10/22/2015 1149   ALKPHOS 63 10/22/2015 1149   AST 27 10/22/2015 1149   ALT 30 10/22/2015 1149   BILITOT 0.36 10/22/2015 1149     Results for JUSTYN, BOYSON (MRN [id number]) as of 11/07/2015 17:55  Ref. Range 07/09/2015 11:52 07/30/2015 11:38 08/28/2015 11:53 09/24/2015 11:52 10/22/2015 11:49  CA 27.29 Latest Ref Range: 0.0 - 38.6 U/mL 1,635.9 (H) 1,720.6 (H) 1,679.3 (H) 1,571.0 (H) 1,748.2 (H)    No components found for: LABCA125  No results for input(s): INR in the last 168 hours.  Urinalysis    Component Value Date/Time   COLORURINE YELLOW 11/17/2014 0143   APPEARANCEUR CLOUDY (A) 11/17/2014 0143   LABSPEC 1.010 11/17/2014 0143   LABSPEC 1.005 09/12/2013 1542   PHURINE 6.5 11/17/2014 0143   GLUCOSEU NEGATIVE 11/17/2014 0143   GLUCOSEU Negative 09/12/2013 1542   HGBUR NEGATIVE 11/17/2014 0143   BILIRUBINUR NEGATIVE 11/17/2014 0143   BILIRUBINUR Negative 09/12/2013 1542   KETONESUR NEGATIVE 11/17/2014 0143   PROTEINUR NEGATIVE 11/17/2014 0143   UROBILINOGEN 0.2 11/17/2014 0143   UROBILINOGEN 0.2 09/12/2013 1542   NITRITE NEGATIVE 11/17/2014 0143   LEUKOCYTESUR NEGATIVE 11/17/2014 0143   LEUKOCYTESUR Negative 09/12/2013 1542    STUDIES: No results found.  ASSESSMENT: 55 y.o. [place] woman with stage IV breast cancer, history as follows  (1)  S/p Right upper inner quadrant lumpectomy and sentinel lymph node sampling 03/15/2004 for a pT1c pN0. Stage IA invasive ductal carcinoma, grade 2, estrogen receptor 95% positive, progesterone  receptor 65% positive, HER-2 not amplified; additional surgery 04/25/2004 for seroma or clearance showed no residual tumor  (2) adjuvant chemotherapy with cyclophosphamide and doxorubicin every 21 days x4 completed 07/19/2004  (3) adjuvant radiation given under Dr. Donella Stade in [place] completed July 2006  (4) the patient opted against adjuvant antiestrogen therapy  (5) genetics testing showed no BRCA mutations  (6) biopsy of a palpable right axillary mass 10/24/2009 showed invasive ductal carcinoma, grade 3, estrogen receptor 100% positive, progesterone receptor 2% positive (alert score 5) HER-2 negative; no evidence of systemic disease on PET scanning  (7) completed 3 of 4 planned cycles of docetaxel and cyclophosphamide September 2011, fourth cycle omitted because of marked elevations in liver function tests  (8) an right axillary lymph node dissection 03/06/2010 showed 3/8 lymph nodes removed to be involved by tumor, with extracapsular extension.  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, TTS-1 negative), estrogen receptor 80% positive, progesterone receptor 2% positive, HER-2 not amplified  (12) letrozole started March 2015-- tolerated with significant side effects, discontinued at the end of May 2015  (13) PET scan 08/16/2013 shows extensive left pleural metastatic disease and a large left pleural effusion that shifts cardiac and mediastinal structures to the right; adenopathy (celiac trunk, periadrenal, periaortic); and a left medial clavicular lesion; Status post left thoracentesis  08/16/2013 positive for adenocarcinoma, estrogen receptor positive, progesterone receptor negative.  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 treated with Lovenox for 5-6 months  (c) right upper extremity lymphedema--receiving physical therapy  (d) hepatic steatosis with chronically elevated LFTs as well as unusual hepatic sensitivity to chemotherapy  (e) osteopenia with the lowest T score -1.6 on bone density scan  06/20/2013  (f) radiation oncology (Dr Valere Dross) has reviewed prior radiation records in case there is further mediastinal involvement with dysphagia etc in which case palliative XRT could be considered  (a) radiation to left mediastinum/ left 7th rib 3250 cGy in 13 sessions04/18/2016 through 08/02/2014  (b) radiation to T11 area: 22 Gy in 7 sessions, last dose 11/27/2014  (c) radiation left parietal scalp region to be completed 12/22/2014  (d) radiation to sacral area completed 04/09/2015  (g) chest wall and perineal pain--improved post radiation treatments  (a) discussed celebrex/ carafate but holding off for now  (h) zoster diagnosed 04/04/2015-- on valacyclovir   PLAN: I spent approximately 40 minutes today with Aoi going over her situation. I think she is tolerating her treatment remarkably well. She has had no neutrophil reading below 1.5. The discomfort from the fulvestrant is to some extent expected. Certainly we can have her receive those treatments in the infusion area if she would be more comfortable there.  She is anxious and discouraged because there was a bump up in the CA-27-29. In reality there has been no trend.  Accordingly she feels we are going to get "bad news" when we get the restaging scans in a couple of weeks. Actually I think are going to see at least stable disease.  She wanted to postpone the next fulvestrant dose until after the scans in case we decided to stop those treatments, but denies suggested we continue just like we are doing and then to find out if it is working or not after that we can decide on any changes.  Of course she also wonders what we will go to if the current treatment does not work. She is adamant that she will not want any kind of chemotherapy. Options are limited, but there will be other options. We will discuss that at the next visit.   Chauncey Cruel, MD   11/07/2015 11:35 AM

## 2015-11-09 ENCOUNTER — Ambulatory Visit: Payer: 59 | Admitting: Physical Therapy

## 2015-11-09 DIAGNOSIS — I890 Lymphedema, not elsewhere classified: Secondary | ICD-10-CM | POA: Diagnosis not present

## 2015-11-09 DIAGNOSIS — M79651 Pain in right thigh: Secondary | ICD-10-CM

## 2015-11-09 NOTE — Therapy (Signed)
[place], [place], [address] Phone: [phone]   Fax:  [phone]  Physical Therapy Treatment  Patient Details  Name: [name] [name] MRN: [id number] Date of Birth: 1960-11-10 Referring Provider: Dr. Lurline Del  Encounter Date: 11/09/2015      PT End of Session - 11/09/15 1204    Visit Number [id number]  62 for lymph   Number of Visits 72  for lymph   Date for PT Re-Evaluation 12/10/15   Authorization - Visit Number [id number]   PT Start Time 1025   PT Stop Time 1115   PT Time Calculation (min) 50 min   Activity Tolerance Patient tolerated treatment well   Behavior During Therapy [facility] for tasks assessed/performed      Past Medical History:  Diagnosis Date  . Bone metastases ([place]) dx'd 05/2014  . Breast cancer ([place]) dx'd 2005/2011  . Peripheral vascular disease ([place]) 02/2010   blood clot related to porta cath  . PONV (postoperative nausea and vomiting)   . S/P radiation therapy 07/17/2014 through 08/02/2014    Left mediastinum, left seventh rib 3250 cGy in 13 sessions   . S/P radiation therapy 12/11/2014 through 12/22/2014    Left parietal calvarium 2400 cGy in 8 sessions   . Seizures ([place]) 2010   Isolated incident.    Past Surgical History:  Procedure Laterality Date  . AXILLARY LYMPH NODE DISSECTION  Dec. 2011  . BREAST LUMPECTOMY  2005  . MEDIASTINOTOMY CHAMBERLAIN MCNEIL Left 06/02/2013   Procedure: MEDIASTINOTOMY CHAMBERLAIN MCNEIL;  Surgeon: [name], MD;  Location: [facility];  Service: Thoracic;  Laterality: Left;  LEFT ANTERIOR MEDIASTINOTOMY   . PORTACATH PLACEMENT  12/11  . removal portacath      There were no vitals filed for this visit.      Subjective Assessment - 11/09/15 1027    Subjective Blood counts were low on  labs this week.  Stubbed her toe this morning (right great toe), so washed it and put a bandaid on it.  Saw Magrinat this week. Used the Flexitouch lymphedema pump on Wednesday, and thinks that's why the groin area hurts a little more.  It doesn't really help the underarm, but helped the arm and hand a little, and just for an hour and a half or so.   Currently in Pain? Yes   Pain Score 6    Pain Location Axilla   Pain Orientation Right   Pain Score 3   Pain Location Groin   Pain Orientation Right                         [facility] Adult PT Treatment/Exercise - 11/09/15 0001      Manual Therapy   Myofascial Release right axilla with focus on scar tightness   Manual Lymphatic Drainage (MLD) In left sidelying, posterior interaxillary anastomosis and right axillo-inguinal anastomosis; in supine, short neck, left axilla and anterior interaxillary anastomosis, right groin and axillo-inguinal anastomosis, and right upper extremity from fingers to shoulder.  In right sidelying, left periscapular area toward left groin.   Other Manual Therapy soft tissue work at superior and entire aspect of right medial thigh in right sidelying for pain relief and muscle relaxation, gentle myofascial release                  PT Short Term Goals - 02/27/15 1011      PT SHORT TERM GOAL #1   Title  pain with walking decreased >/= 25%   Time 3   Period Weeks   Status Achieved     PT SHORT TERM GOAL #2   Title pelvis stays in correct alignment for 3 weeks   Time 3   Period Weeks   Status Achieved           PT Long Term Goals - 08/14/15 1317      PT LONG TERM GOAL #1   Title indpendent with HEP   Time 12   Period Weeks   Status On-going     PT LONG TERM GOAL #2   Title pain with walking decreased >/= 80%   Time 12   Period Weeks   Status On-going     PT LONG TERM GOAL #3   Title go up and down steps with a step over step pattern   Time 6   Status Deferred     PT LONG  TERM GOAL #4   Title being able to last for 5 days without therapy and pain not going above a 8/10 due to decreased muscle soreness   Time 12   Period Weeks   Status Achieved           Long Term Clinic Goals - 11/09/15 1207      CC Long Term Goal  #2   Title Pt. will report swelling is adequately managed to enable ADL function at a consistent level.   Status On-going     CC Long Term Goal  #4   Title Pain/discomfort at right axilla area will be controlled at 6/10 or less.   Status On-going     CC Long Term Goal  #5   Title Patient will avoid infection by ongoing management of her lymphedema at right breast/axilla/upper arm areas.   Status On-going            Plan - 11/09/15 1205    Clinical Impression Statement Induration at right upper outer breast increased today, as well as swelling of right hand and fingers; both showed improvement at end of session.  Patient had tried to use her Flexitouch pump on Wednesday, but found it helped only temporarily and it increased thigh pain somewhat.   Rehab Potential Good   Clinical Impairments Affecting Rehab Potential active cancer   PT Frequency 2x / week   PT Duration 12 weeks   PT Treatment/Interventions Manual techniques;Manual lymph drainage   PT Next Visit Plan Continue manual lymph drainage for right upper outer breast and for right UE; also for left rib pain; continue right thigh soft tissue work focused on medial right thigh with myofascial release   Consulted and Agree with Plan of Care Patient      Patient will benefit from skilled therapeutic intervention in order to improve the following deficits and impairments:  Pain, Increased muscle spasms, Difficulty walking, Increased edema  Visit Diagnosis: Lymphedema, not elsewhere classified  Pain in right thigh     Problem List Patient Active Problem List   Diagnosis Date Noted  . 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 Zoster 04/04/2015  . Nausea with  vomiting 11/18/2014  . Constipation 11/18/2014  . Left-sided thoracic back pain   . Bone metastases ([place]) 11/16/2014  . Back pain 11/15/2014  . Uncontrolled pain 11/14/2014  . 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 Chest wall pain 03/21/2014  . Abnormal LFTs (liver function tests) 09/12/2013  . Breast cancer of upper-inner quadrant of right female breast ([place]) 08/18/2013  .  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

## 2015-11-10 ENCOUNTER — Other Ambulatory Visit: Payer: Self-pay | Admitting: Oncology

## 2015-11-12 ENCOUNTER — Ambulatory Visit: Payer: 59 | Admitting: Physical Therapy

## 2015-11-12 ENCOUNTER — Other Ambulatory Visit (HOSPITAL_BASED_OUTPATIENT_CLINIC_OR_DEPARTMENT_OTHER): Payer: 59

## 2015-11-12 ENCOUNTER — Encounter: Payer: Self-pay | Admitting: Genetic Counselor

## 2015-11-12 DIAGNOSIS — I890 Lymphedema, not elsewhere classified: Secondary | ICD-10-CM | POA: Diagnosis not present

## 2015-11-12 DIAGNOSIS — C771 Secondary and unspecified malignant neoplasm of intrathoracic lymph nodes: Secondary | ICD-10-CM | POA: Diagnosis not present

## 2015-11-12 DIAGNOSIS — C50211 Malignant neoplasm of upper-inner quadrant of right female breast: Secondary | ICD-10-CM

## 2015-11-12 DIAGNOSIS — J90 Pleural effusion, not elsewhere classified: Secondary | ICD-10-CM

## 2015-11-12 DIAGNOSIS — M79651 Pain in right thigh: Secondary | ICD-10-CM

## 2015-11-12 LAB — CBC WITH DIFFERENTIAL/PLATELET
BASO%: 2.1 % — ABNORMAL HIGH (ref 0.0–2.0)
Basophils Absolute: 0.1 10*3/uL (ref 0.0–0.1)
EOS%: 1.8 % (ref 0.0–7.0)
Eosinophils Absolute: 0.1 10*3/uL (ref 0.0–0.5)
HCT: 34.9 % (ref 34.8–46.6)
HEMOGLOBIN: 12.6 g/dL (ref 11.6–15.9)
LYMPH#: 0.9 10*3/uL (ref 0.9–3.3)
LYMPH%: 25.2 % (ref 14.0–49.7)
MCH: 36.3 pg — AB (ref 25.1–34.0)
MCHC: 36.1 g/dL — AB (ref 31.5–36.0)
MCV: 100.6 fL (ref 79.5–101.0)
MONO#: 0.2 10*3/uL (ref 0.1–0.9)
MONO%: 6.5 % (ref 0.0–14.0)
NEUT#: 2.2 10*3/uL (ref 1.5–6.5)
NEUT%: 64.4 % (ref 38.4–76.8)
Platelets: 300 10*3/uL (ref 145–400)
RBC: 3.47 10*6/uL — ABNORMAL LOW (ref 3.70–5.45)
RDW: 14.1 % (ref 11.2–14.5)
WBC: 3.4 10*3/uL — ABNORMAL LOW (ref 3.9–10.3)
nRBC: 0 % (ref 0–0)

## 2015-11-12 NOTE — Therapy (Signed)
[place], [place], [address] Phone: [phone]   Fax:  [phone]  Physical Therapy Treatment  Patient Details  Name: [name] [name] MRN: [id number] Date of Birth: 1961/02/07 Referring Provider: Dr. Lurline Del  Encounter Date: 11/12/2015      PT End of Session - 11/12/15 2054    Visit Number [id number] for lymph   Number of Visits 72  for lymph   Date for PT Re-Evaluation 12/10/15   Authorization - Visit Number 140   PT Start Time 1020   PT Stop Time 1102   PT Time Calculation (min) 42 min   Activity Tolerance Patient tolerated treatment well   Behavior During Therapy [facility] for tasks assessed/performed      Past Medical History:  Diagnosis Date  . Bone metastases ([place]) dx'd 05/2014  . Breast cancer ([place]) dx'd 2005/2011  . Peripheral vascular disease ([place]) 02/2010   blood clot related to porta cath  . PONV (postoperative nausea and vomiting)   . S/P radiation therapy 07/17/2014 through 08/02/2014    Left mediastinum, left seventh rib 3250 cGy in 13 sessions   . S/P radiation therapy 12/11/2014 through 12/22/2014    Left parietal calvarium 2400 cGy in 8 sessions   . Seizures ([place]) 2010   Isolated incident.    Past Surgical History:  Procedure Laterality Date  . AXILLARY LYMPH NODE DISSECTION  Dec. 2011  . BREAST LUMPECTOMY  2005  . MEDIASTINOTOMY CHAMBERLAIN MCNEIL Left 06/02/2013   Procedure: MEDIASTINOTOMY CHAMBERLAIN MCNEIL;  Surgeon: [name], MD;  Location: [place];  Service: Thoracic;  Laterality: Left;  LEFT ANTERIOR MEDIASTINOTOMY   . PORTACATH PLACEMENT  12/11  . removal portacath      There were no vitals filed for this visit.      Subjective Assessment - 11/12/15 1023    Subjective Dr. Jana Hakim put me on  Claritin.  Did the Flexitouch on Saturday because the arm was hurting.   Currently in Pain? Yes   Pain Score 6    Pain Location Axilla   Pain Orientation Right   Pain Type Chronic pain   Aggravating Factors  high humidity   Pain Relieving Factors therapy   Pain Score 2   Pain Location Groin   Pain Orientation Right   Aggravating Factors  Flexitouch pump use; walking   Pain Relieving Factors therapy                         [facility] Adult PT Treatment/Exercise - 11/12/15 0001      Manual Therapy   Myofascial Release right axilla with focus on scar tightness   Manual Lymphatic Drainage (MLD) In left sidelying, posterior interaxillary anastomosis and right axillo-inguinal anastomosis; in supine, short neck, left axilla and anterior interaxillary anastomosis, right groin and axillo-inguinal anastomosis, and right upper extremity from fingers to shoulder.  In right sidelying, left periscapular area toward left groin.   Other Manual Therapy soft tissue work at superior and entire aspect of right medial thigh in right sidelying for pain relief and muscle relaxation, gentle myofascial release                  PT Short Term Goals - 02/27/15 1011      PT SHORT TERM GOAL #1   Title pain with walking decreased >/= 25%   Time 3   Period Weeks   Status Achieved     PT  SHORT TERM GOAL #2   Title pelvis stays in correct alignment for 3 weeks   Time 3   Period Weeks   Status Achieved           PT Long Term Goals - 08/14/15 1317      PT LONG TERM GOAL #1   Title indpendent with HEP   Time 12   Period Weeks   Status On-going     PT LONG TERM GOAL #2   Title pain with walking decreased >/= 80%   Time 12   Period Weeks   Status On-going     PT LONG TERM GOAL #3   Title go up and down steps with a step over step pattern   Time 6   Status Deferred     PT LONG TERM GOAL #4   Title being able to last for 5 days without therapy and pain not going above a  8/10 due to decreased muscle soreness   Time 12   Period Weeks   Status Achieved           Long Term Clinic Goals - 11/09/15 1207      CC Long Term Goal  #2   Title Pt. will report swelling is adequately managed to enable ADL function at a consistent level.   Status On-going     CC Long Term Goal  #4   Title Pain/discomfort at right axilla area will be controlled at 6/10 or less.   Status On-going     CC Long Term Goal  #5   Title Patient will avoid infection by ongoing management of her lymphedema at right breast/axilla/upper arm areas.   Status On-going            Plan - 11/12/15 2055    Clinical Impression Statement Right upper outer breast induration and hand swelling a little less at start of this session than at start of last; both continue to benefit from therapy.     Rehab Potential Good   Clinical Impairments Affecting Rehab Potential active cancer   PT Frequency 2x / week   PT Duration 12 weeks   PT Treatment/Interventions Manual techniques;Manual lymph drainage   PT Next Visit Plan Continue manual lymph drainage for right upper outer breast and for right UE; also for left rib pain; continue right thigh soft tissue work focused on medial right thigh with myofascial release   Consulted and Agree with Plan of Care Patient   PT Plan Continue manual lymph drainage, soft tissue work.      Patient will benefit from skilled therapeutic intervention in order to improve the following deficits and impairments:  Pain, Increased muscle spasms, Difficulty walking, Increased edema  Visit Diagnosis: Lymphedema, not elsewhere classified  Pain in right thigh     Problem List Patient Active Problem List   Diagnosis Date Noted  . 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 Zoster 04/04/2015  . Nausea with vomiting 11/18/2014  . Constipation 11/18/2014  . Left-sided thoracic back pain   . Bone metastases ([place]) 11/16/2014  . Back pain 11/15/2014  . Uncontrolled  pain 11/14/2014  . 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 Chest wall pain 03/21/2014  . Abnormal LFTs (liver function tests) 09/12/2013  . Breast cancer of upper-inner quadrant of right female breast ([place]) 08/18/2013  . 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

## 2015-11-15 ENCOUNTER — Other Ambulatory Visit: Payer: Self-pay | Admitting: Oncology

## 2015-11-16 ENCOUNTER — Ambulatory Visit: Payer: 59 | Admitting: Physical Therapy

## 2015-11-16 ENCOUNTER — Other Ambulatory Visit: Payer: Self-pay | Admitting: Oncology

## 2015-11-16 DIAGNOSIS — M79651 Pain in right thigh: Secondary | ICD-10-CM

## 2015-11-16 DIAGNOSIS — I890 Lymphedema, not elsewhere classified: Secondary | ICD-10-CM

## 2015-11-16 NOTE — Therapy (Signed)
[place], [place], [address] Phone: [phone]   Fax:  [phone]  Physical Therapy Treatment  Patient Details  Name: [name] [name] MRN: [id number] Date of Birth: 1960-08-06 Referring Provider: Dr. Lurline Del  Encounter Date: 11/16/2015      PT End of Session - 11/16/15 1216    Visit Number 96  64 for lymph   Number of Visits 72  for lymph   Date for PT Re-Evaluation 12/10/15   Authorization - Visit Number 140   PT Start Time 1033   PT Stop Time 1112   PT Time Calculation (min) 39 min   Activity Tolerance Patient tolerated treatment well   Behavior During Therapy [facility] for tasks assessed/performed      Past Medical History:  Diagnosis Date  . Bone metastases ([place]) dx'd 05/2014  . Breast cancer ([place]) dx'd 2005/2011  . Peripheral vascular disease ([place]) 02/2010   blood clot related to porta cath  . PONV (postoperative nausea and vomiting)   . S/P radiation therapy 07/17/2014 through 08/02/2014    Left mediastinum, left seventh rib 3250 cGy in 13 sessions   . S/P radiation therapy 12/11/2014 through 12/22/2014    Left parietal calvarium 2400 cGy in 8 sessions   . Seizures ([place]) 2010   Isolated incident.    Past Surgical History:  Procedure Laterality Date  . AXILLARY LYMPH NODE DISSECTION  Dec. 2011  . BREAST LUMPECTOMY  2005  . MEDIASTINOTOMY CHAMBERLAIN MCNEIL Left 06/02/2013   Procedure: MEDIASTINOTOMY CHAMBERLAIN MCNEIL;  Surgeon: [name], MD;  Location: [place];  Service: Thoracic;  Laterality: Left;  LEFT ANTERIOR MEDIASTINOTOMY   . PORTACATH PLACEMENT  12/11  . removal portacath      There were no vitals filed for this visit.      Subjective Assessment - 11/16/15 1220    Subjective Hand swells up in this  hot, humid weather.   Currently in Pain? Yes   Pain Score 6    Pain Location Axilla   Pain Orientation Right   Pain Descriptors / Indicators Other (Comment)  fullness   Aggravating Factors  high humidity, heat   Pain Relieving Factors therapy   Pain Score 2   Pain Location Groin   Pain Orientation Right   Pain Descriptors / Indicators Tightness   Aggravating Factors  walking a lot on it   Pain Relieving Factors therapy                         [facility] Adult PT Treatment/Exercise - 11/16/15 0001      Manual Therapy   Myofascial Release right axilla with focus on scar tightness   Manual Lymphatic Drainage (MLD) In left sidelying, posterior interaxillary anastomosis and right axillo-inguinal anastomosis; in supine, short neck, left axilla and anterior interaxillary anastomosis, right groin and axillo-inguinal anastomosis, and right upper extremity from fingers to shoulder.  In right sidelying, left periscapular area toward left groin.   Other Manual Therapy soft tissue work at superior and entire aspect of right medial thigh in right sidelying for pain relief and muscle relaxation, gentle myofascial release                  PT Short Term Goals - 02/27/15 1011      PT SHORT TERM GOAL #1   Title pain with walking decreased >/= 25%   Time 3   Period Weeks   Status Achieved  PT SHORT TERM GOAL #2   Title pelvis stays in correct alignment for 3 weeks   Time 3   Period Weeks   Status Achieved           PT Long Term Goals - 08/14/15 1317      PT LONG TERM GOAL #1   Title indpendent with HEP   Time 12   Period Weeks   Status On-going     PT LONG TERM GOAL #2   Title pain with walking decreased >/= 80%   Time 12   Period Weeks   Status On-going     PT LONG TERM GOAL #3   Title go up and down steps with a step over step pattern   Time 6   Status Deferred     PT LONG TERM GOAL #4   Title being able to last for 5 days without therapy and  pain not going above a 8/10 due to decreased muscle soreness   Time 12   Period Weeks   Status Achieved           Long Term Clinic Goals - 11/16/15 1219      CC Long Term Goal  #2   Title Pt. will report swelling is adequately managed to enable ADL function at a consistent level.   Status On-going     CC Long Term Goal  #4   Title Pain/discomfort at right axilla area will be controlled at 6/10 or less.   Status On-going     CC Long Term Goal  #5   Title Patient will avoid infection by ongoing management of her lymphedema at right breast/axilla/upper arm areas.   Status On-going            Plan - 11/16/15 1217    Clinical Impression Statement Continuing to benefit from therapy.   Rehab Potential Good   Clinical Impairments Affecting Rehab Potential active cancer   PT Frequency 2x / week   PT Duration 12 weeks   PT Treatment/Interventions Manual techniques;Manual lymph drainage   PT Next Visit Plan Continue manual lymph drainage for right upper outer breast and for right UE; also for left rib pain; continue right thigh soft tissue work focused on medial right thigh with myofascial release   Consulted and Agree with Plan of Care Patient   PT Plan Continue manual lymph drainage, soft tissue work.      Patient will benefit from skilled therapeutic intervention in order to improve the following deficits and impairments:  Pain, Increased muscle spasms, Difficulty walking, Increased edema  Visit Diagnosis: Lymphedema, not elsewhere classified  Pain in right thigh     Problem List Patient Active Problem List   Diagnosis Date Noted  . 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 Zoster 04/04/2015  . Nausea with vomiting 11/18/2014  . Constipation 11/18/2014  . Left-sided thoracic back pain   . Bone metastases ([place]) 11/16/2014  . Back pain 11/15/2014  . Uncontrolled pain 11/14/2014  . 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 Chest wall pain  03/21/2014  . Abnormal LFTs (liver function tests) 09/12/2013  . Breast cancer of upper-inner quadrant of right female breast ([place]) 08/18/2013  . 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

## 2015-11-19 ENCOUNTER — Ambulatory Visit: Payer: 59 | Admitting: Physical Therapy

## 2015-11-19 ENCOUNTER — Encounter: Payer: Self-pay | Admitting: Oncology

## 2015-11-19 ENCOUNTER — Other Ambulatory Visit (HOSPITAL_BASED_OUTPATIENT_CLINIC_OR_DEPARTMENT_OTHER): Payer: 59

## 2015-11-19 DIAGNOSIS — C7951 Secondary malignant neoplasm of bone: Secondary | ICD-10-CM

## 2015-11-19 DIAGNOSIS — C50211 Malignant neoplasm of upper-inner quadrant of right female breast: Secondary | ICD-10-CM | POA: Diagnosis not present

## 2015-11-19 DIAGNOSIS — C771 Secondary and unspecified malignant neoplasm of intrathoracic lymph nodes: Secondary | ICD-10-CM | POA: Diagnosis not present

## 2015-11-19 DIAGNOSIS — J90 Pleural effusion, not elsewhere classified: Secondary | ICD-10-CM

## 2015-11-19 DIAGNOSIS — I890 Lymphedema, not elsewhere classified: Secondary | ICD-10-CM | POA: Diagnosis not present

## 2015-11-19 DIAGNOSIS — M79651 Pain in right thigh: Secondary | ICD-10-CM

## 2015-11-19 LAB — CBC WITH DIFFERENTIAL/PLATELET
BASO%: 2.2 % — ABNORMAL HIGH (ref 0.0–2.0)
Basophils Absolute: 0.1 10*3/uL (ref 0.0–0.1)
EOS ABS: 0.1 10*3/uL (ref 0.0–0.5)
EOS%: 2.2 % (ref 0.0–7.0)
HCT: 35.3 % (ref 34.8–46.6)
HGB: 12.4 g/dL (ref 11.6–15.9)
LYMPH%: 25.3 % (ref 14.0–49.7)
MCH: 36 pg — AB (ref 25.1–34.0)
MCHC: 35.1 g/dL (ref 31.5–36.0)
MCV: 102.6 fL — AB (ref 79.5–101.0)
MONO#: 0.2 10*3/uL (ref 0.1–0.9)
MONO%: 7.6 % (ref 0.0–14.0)
NEUT%: 62.7 % (ref 38.4–76.8)
NEUTROS ABS: 1.7 10*3/uL (ref 1.5–6.5)
PLATELETS: 238 10*3/uL (ref 145–400)
RBC: 3.44 10*6/uL — AB (ref 3.70–5.45)
RDW: 14.2 % (ref 11.2–14.5)
WBC: 2.8 10*3/uL — AB (ref 3.9–10.3)
lymph#: 0.7 10*3/uL — ABNORMAL LOW (ref 0.9–3.3)

## 2015-11-19 LAB — COMPREHENSIVE METABOLIC PANEL
ALT: 28 U/L (ref 0–55)
ANION GAP: 8 meq/L (ref 3–11)
AST: 25 U/L (ref 5–34)
Albumin: 4 g/dL (ref 3.5–5.0)
Alkaline Phosphatase: 65 U/L (ref 40–150)
BILIRUBIN TOTAL: 0.44 mg/dL (ref 0.20–1.20)
BUN: 10.3 mg/dL (ref 7.0–26.0)
CHLORIDE: 102 meq/L (ref 98–109)
CO2: 26 meq/L (ref 22–29)
CREATININE: 0.9 mg/dL (ref 0.6–1.1)
Calcium: 9.8 mg/dL (ref 8.4–10.4)
EGFR: 72 mL/min/{1.73_m2} — AB (ref >90)
Glucose: 82 mg/dl (ref 70–140)
Potassium: 3.8 mEq/L (ref 3.5–5.1)
SODIUM: 137 meq/L (ref 136–145)
TOTAL PROTEIN: 7.6 g/dL (ref 6.4–8.3)

## 2015-11-19 NOTE — Progress Notes (Signed)
Left [place] app for dr to sign and will get patient's portion at her visit

## 2015-11-19 NOTE — Therapy (Signed)
[place], [place], [address] Phone: [phone]   Fax:  [phone]  Physical Therapy Treatment  Patient Details  Name: [name] [name] MRN: [id number] Date of Birth: 1960/06/20 Referring Provider: Dr. Lurline Del  Encounter Date: 11/19/2015      PT End of Session - 11/19/15 1259    Visit Number 97  65 for lymph   Number of Visits 72  for lymph   Date for PT Re-Evaluation 12/10/15   Authorization - Visit Number 140   PT Start Time [id number]   PT Stop Time 1104   PT Time Calculation (min) 43 min   Activity Tolerance Patient tolerated treatment well   Behavior During Therapy [facility] for tasks assessed/performed      Past Medical History:  Diagnosis Date  . Bone metastases ([place]) dx'd 05/2014  . Breast cancer ([place]) dx'd 2005/2011  . Peripheral vascular disease ([place]) 02/2010   blood clot related to porta cath  . PONV (postoperative nausea and vomiting)   . S/P radiation therapy 07/17/2014 through 08/02/2014    Left mediastinum, left seventh rib 3250 cGy in 13 sessions   . S/P radiation therapy 12/11/2014 through 12/22/2014    Left parietal calvarium 2400 cGy in 8 sessions   . Seizures ([place]) 2010   Isolated incident.    Past Surgical History:  Procedure Laterality Date  . AXILLARY LYMPH NODE DISSECTION  Dec. 2011  . BREAST LUMPECTOMY  2005  . MEDIASTINOTOMY CHAMBERLAIN MCNEIL Left 06/02/2013   Procedure: MEDIASTINOTOMY CHAMBERLAIN MCNEIL;  Surgeon: [name], MD;  Location: [place];  Service: Thoracic;  Laterality: Left;  LEFT ANTERIOR MEDIASTINOTOMY   . PORTACATH PLACEMENT  12/11  . removal portacath      There were no vitals filed for this visit.      Subjective Assessment - 11/19/15 1025    Subjective Couldn't walk yesterday  because it was so tight.   Currently in Pain? Yes   Pain Score 6    Pain Location Axilla   Pain Orientation Right   Pain Score 2   Pain Location Groin   Pain Orientation Right   Pain Descriptors / Indicators Tightness                         [facility] Adult PT Treatment/Exercise - 11/19/15 0001      Manual Therapy   Myofascial Release right axilla with focus on scar tightness   Manual Lymphatic Drainage (MLD) In left sidelying, posterior interaxillary anastomosis and right axillo-inguinal anastomosis; in supine, short neck, left axilla and anterior interaxillary anastomosis, right groin and axillo-inguinal anastomosis, and right upper extremity from fingers to shoulder.  In right sidelying, left periscapular area toward left groin.   Other Manual Therapy soft tissue work at superior and entire aspect of right medial thigh in right sidelying for pain relief and muscle relaxation, gentle myofascial release                  PT Short Term Goals - 02/27/15 1011      PT SHORT TERM GOAL #1   Title pain with walking decreased >/= 25%   Time 3   Period Weeks   Status Achieved     PT SHORT TERM GOAL #2   Title pelvis stays in correct alignment for 3 weeks   Time 3   Period Weeks   Status Achieved  PT Long Term Goals - 08/14/15 1317      PT LONG TERM GOAL #1   Title indpendent with HEP   Time 12   Period Weeks   Status On-going     PT LONG TERM GOAL #2   Title pain with walking decreased >/= 80%   Time 12   Period Weeks   Status On-going     PT LONG TERM GOAL #3   Title go up and down steps with a step over step pattern   Time 6   Status Deferred     PT LONG TERM GOAL #4   Title being able to last for 5 days without therapy and pain not going above a 8/10 due to decreased muscle soreness   Time 12   Period Weeks   Status Achieved           Long Term Clinic Goals - 11/16/15 1219      CC Long Term Goal  #2   Title Pt. will  report swelling is adequately managed to enable ADL function at a consistent level.   Status On-going     CC Long Term Goal  #4   Title Pain/discomfort at right axilla area will be controlled at 6/10 or less.   Status On-going     CC Long Term Goal  #5   Title Patient will avoid infection by ongoing management of her lymphedema at right breast/axilla/upper arm areas.   Status On-going            Plan - 11/19/15 1259    Clinical Impression Statement Now with consistent hand swelling, helped by manual lymph drainage; also continues to have right upper outer breast induration at start of treatment.  Groin pain being maintained in the 2-3/10 range.   Rehab Potential Good   Clinical Impairments Affecting Rehab Potential active cancer   PT Frequency 2x / week   PT Duration 12 weeks   PT Treatment/Interventions Manual techniques;Manual lymph drainage   PT Next Visit Plan Continue manual lymph drainage for right upper outer breast and for right UE; also for left rib pain; continue right thigh soft tissue work focused on medial right thigh with myofascial release   Consulted and Agree with Plan of Care Patient   PT Plan Continue manual lymph drainage, soft tissue work.      Patient will benefit from skilled therapeutic intervention in order to improve the following deficits and impairments:  Pain, Increased muscle spasms, Difficulty walking, Increased edema  Visit Diagnosis: Pain in right thigh     Problem List Patient Active Problem List   Diagnosis Date Noted  . 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 Zoster 04/04/2015  . Nausea with vomiting 11/18/2014  . Constipation 11/18/2014  . Left-sided thoracic back pain   . Bone metastases ([place]) 11/16/2014  . Back pain 11/15/2014  . Uncontrolled pain 11/14/2014  . 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 Chest wall pain 03/21/2014  . Abnormal LFTs (liver function tests) 09/12/2013  . Breast cancer of  upper-inner quadrant of right female breast ([place]) 08/18/2013  . 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

## 2015-11-19 NOTE — Telephone Encounter (Signed)
error 

## 2015-11-20 LAB — CANCER ANTIGEN 27.29: CA 27.29: 1572.2 U/mL — ABNORMAL HIGH (ref 0.0–38.6)

## 2015-11-21 ENCOUNTER — Ambulatory Visit (HOSPITAL_BASED_OUTPATIENT_CLINIC_OR_DEPARTMENT_OTHER): Payer: 59

## 2015-11-21 ENCOUNTER — Other Ambulatory Visit: Payer: Self-pay | Admitting: *Deleted

## 2015-11-21 VITALS — BP 147/93 | HR 89 | Temp 97.8°F | Resp 18

## 2015-11-21 DIAGNOSIS — C7951 Secondary malignant neoplasm of bone: Secondary | ICD-10-CM

## 2015-11-21 DIAGNOSIS — Z5111 Encounter for antineoplastic chemotherapy: Secondary | ICD-10-CM

## 2015-11-21 DIAGNOSIS — C50911 Malignant neoplasm of unspecified site of right female breast: Secondary | ICD-10-CM | POA: Diagnosis not present

## 2015-11-21 DIAGNOSIS — C771 Secondary and unspecified malignant neoplasm of intrathoracic lymph nodes: Secondary | ICD-10-CM

## 2015-11-21 DIAGNOSIS — C50211 Malignant neoplasm of upper-inner quadrant of right female breast: Secondary | ICD-10-CM

## 2015-11-21 MED ORDER — DENOSUMAB 120 MG/1.7ML ~~LOC~~ SOLN
120.0000 mg | Freq: Once | SUBCUTANEOUS | Status: AC
  Administered 2015-11-21: 120 mg via SUBCUTANEOUS
  Filled 2015-11-21: qty 1.7

## 2015-11-21 MED ORDER — DENOSUMAB 120 MG/1.7ML ~~LOC~~ SOLN
120.0000 mg | Freq: Once | SUBCUTANEOUS | Status: DC
  Filled 2015-11-21: qty 1.7

## 2015-11-21 MED ORDER — FULVESTRANT 250 MG/5ML IM SOLN
500.0000 mg | Freq: Once | INTRAMUSCULAR | Status: DC
  Filled 2015-11-21: qty 10

## 2015-11-21 MED ORDER — FULVESTRANT 250 MG/5ML IM SOLN
500.0000 mg | Freq: Once | INTRAMUSCULAR | Status: AC
  Administered 2015-11-21: 500 mg via INTRAMUSCULAR
  Filled 2015-11-21: qty 10

## 2015-11-21 NOTE — Patient Instructions (Signed)
Denosumab injection What is this medicine? DENOSUMAB (den oh sue mab) slows bone breakdown. Prolia is used to treat osteoporosis in women after menopause and in men. Xgeva is used to prevent bone fractures and other bone problems caused by cancer bone metastases. Xgeva is also used to treat giant cell tumor of the bone. This medicine may be used for other purposes; ask your health care provider or pharmacist if you have questions. What should I tell my health care provider before I take this medicine? They need to know if you have any of these conditions: -dental disease -eczema -infection or history of infections -kidney disease or on dialysis -low blood calcium or vitamin D -malabsorption syndrome -scheduled to have surgery or tooth extraction -taking medicine that contains denosumab -thyroid or parathyroid disease -an unusual reaction to denosumab, other medicines, foods, dyes, or preservatives -pregnant or trying to get pregnant -breast-feeding How should I use this medicine? This medicine is for injection under the skin. It is given by a health care professional in a hospital or clinic setting. If you are getting Prolia, a special MedGuide will be given to you by the pharmacist with each prescription and refill. Be sure to read this information carefully each time. For Prolia, talk to your pediatrician regarding the use of this medicine in children. Special care may be needed. For Xgeva, talk to your pediatrician regarding the use of this medicine in children. While this drug may be prescribed for children as young as 13 years for selected conditions, precautions do apply. Overdosage: If you think you have taken too much of this medicine contact a poison control center or emergency room at once. NOTE: This medicine is only for you. Do not share this medicine with others. What if I miss a dose? It is important not to miss your dose. Call your doctor or health care professional if you are  unable to keep an appointment. What may interact with this medicine? Do not take this medicine with any of the following medications: -other medicines containing denosumab This medicine may also interact with the following medications: -medicines that suppress the immune system -medicines that treat cancer -steroid medicines like prednisone or cortisone This list may not describe all possible interactions. Give your health care provider a list of all the medicines, herbs, non-prescription drugs, or dietary supplements you use. Also tell them if you smoke, drink alcohol, or use illegal drugs. Some items may interact with your medicine. What should I watch for while using this medicine? Visit your doctor or health care professional for regular checks on your progress. Your doctor or health care professional may order blood tests and other tests to see how you are doing. Call your doctor or health care professional if you get a cold or other infection while receiving this medicine. Do not treat yourself. This medicine may decrease your body's ability to fight infection. You should make sure you get enough calcium and vitamin D while you are taking this medicine, unless your doctor tells you not to. Discuss the foods you eat and the vitamins you take with your health care professional. See your dentist regularly. Brush and floss your teeth as directed. Before you have any dental work done, tell your dentist you are receiving this medicine. Do not become pregnant while taking this medicine or for 5 months after stopping it. Women should inform their doctor if they wish to become pregnant or think they might be pregnant. There is a potential for serious side effects   to an unborn child. Talk to your health care professional or pharmacist for more information. What side effects may I notice from receiving this medicine? Side effects that you should report to your doctor or health care professional as soon as  possible: -allergic reactions like skin rash, itching or hives, swelling of the face, lips, or tongue -breathing problems -chest pain -fast, irregular heartbeat -feeling faint or lightheaded, falls -fever, chills, or any other sign of infection -muscle spasms, tightening, or twitches -numbness or tingling -skin blisters or bumps, or is dry, peels, or red -slow healing or unexplained pain in the mouth or jaw -unusual bleeding or bruising Side effects that usually do not require medical attention (Report these to your doctor or health care professional if they continue or are bothersome.): -muscle pain -stomach upset, gas This list may not describe all possible side effects. Call your doctor for medical advice about side effects. You may report side effects to FDA at 1-800-FDA-1088. Where should I keep my medicine? This medicine is only given in a clinic, doctor's office, or other health care setting and will not be stored at home. NOTE: This sheet is a summary. It may not cover all possible information. If you have questions about this medicine, talk to your doctor, pharmacist, or health care provider.    2016, Elsevier/Gold Standard. (2011-09-15 12:37:47) Fulvestrant injection What is this medicine? FULVESTRANT (ful VES trant) blocks the effects of estrogen. It is used to treat breast cancer. This medicine may be used for other purposes; ask your health care provider or pharmacist if you have questions. What should I tell my health care provider before I take this medicine? They need to know if you have any of these conditions: -bleeding problems -liver disease -low levels of platelets in the blood -an unusual or allergic reaction to fulvestrant, other medicines, foods, dyes, or preservatives -pregnant or trying to get pregnant -breast-feeding How should I use this medicine? This medicine is for injection into a muscle. It is usually given by a health care professional in a  hospital or clinic setting. Talk to your pediatrician regarding the use of this medicine in children. Special care may be needed. Overdosage: If you think you have taken too much of this medicine contact a poison control center or emergency room at once. NOTE: This medicine is only for you. Do not share this medicine with others. What if I miss a dose? It is important not to miss your dose. Call your doctor or health care professional if you are unable to keep an appointment. What may interact with this medicine? -medicines that treat or prevent blood clots like warfarin, enoxaparin, and dalteparin This list may not describe all possible interactions. Give your health care provider a list of all the medicines, herbs, non-prescription drugs, or dietary supplements you use. Also tell them if you smoke, drink alcohol, or use illegal drugs. Some items may interact with your medicine. What should I watch for while using this medicine? Your condition will be monitored carefully while you are receiving this medicine. You will need important blood work done while you are taking this medicine. Do not become pregnant while taking this medicine or for at least 1 year after stopping it. Women of child-bearing potential will need to have a negative pregnancy test before starting this medicine. Women should inform their doctor if they wish to become pregnant or think they might be pregnant. There is a potential for serious side effects to an unborn child. Men   should inform their doctors if they wish to father a child. This medicine may lower sperm counts. Talk to your health care professional or pharmacist for more information. Do not breast-feed an infant while taking this medicine or for 1 year after the last dose. What side effects may I notice from receiving this medicine? Side effects that you should report to your doctor or health care professional as soon as possible: -allergic reactions like skin rash,  itching or hives, swelling of the face, lips, or tongue -feeling faint or lightheaded, falls -pain, tingling, numbness, or weakness in the legs -signs and symptoms of infection like fever or chills; cough; flu-like symptoms; sore throat -vaginal bleeding Side effects that usually do not require medical attention (report to your doctor or health care professional if they continue or are bothersome): -aches, pains -constipation -diarrhea -headache -hot flashes -nausea, vomiting -pain at site where injected -stomach pain This list may not describe all possible side effects. Call your doctor for medical advice about side effects. You may report side effects to FDA at 1-800-FDA-1088. Where should I keep my medicine? This drug is given in a hospital or clinic and will not be stored at home. NOTE: This sheet is a summary. It may not cover all possible information. If you have questions about this medicine, talk to your doctor, pharmacist, or health care provider.    2016, Elsevier/Gold Standard. (2014-10-13 11:03:55)  

## 2015-11-23 ENCOUNTER — Ambulatory Visit (HOSPITAL_COMMUNITY)
Admission: RE | Admit: 2015-11-23 | Discharge: 2015-11-23 | Disposition: A | Discharge status: Home / Self Care | Payer: 59 | Source: Ambulatory Visit | Attending: Oncology | Admitting: Oncology

## 2015-11-23 ENCOUNTER — Ambulatory Visit: Payer: 59

## 2015-11-23 ENCOUNTER — Ambulatory Visit: Payer: 59 | Admitting: Physical Therapy

## 2015-11-23 DIAGNOSIS — C772 Secondary and unspecified malignant neoplasm of intra-abdominal lymph nodes: Secondary | ICD-10-CM | POA: Insufficient documentation

## 2015-11-23 DIAGNOSIS — C787 Secondary malignant neoplasm of liver and intrahepatic bile duct: Secondary | ICD-10-CM | POA: Diagnosis not present

## 2015-11-23 DIAGNOSIS — C50211 Malignant neoplasm of upper-inner quadrant of right female breast: Secondary | ICD-10-CM | POA: Diagnosis not present

## 2015-11-23 DIAGNOSIS — K8020 Calculus of gallbladder without cholecystitis without obstruction: Secondary | ICD-10-CM | POA: Insufficient documentation

## 2015-11-23 DIAGNOSIS — I890 Lymphedema, not elsewhere classified: Secondary | ICD-10-CM

## 2015-11-23 DIAGNOSIS — M79651 Pain in right thigh: Secondary | ICD-10-CM

## 2015-11-23 DIAGNOSIS — C7951 Secondary malignant neoplasm of bone: Secondary | ICD-10-CM

## 2015-11-23 DIAGNOSIS — C771 Secondary and unspecified malignant neoplasm of intrathoracic lymph nodes: Secondary | ICD-10-CM | POA: Insufficient documentation

## 2015-11-23 MED ORDER — GADOBENATE DIMEGLUMINE 529 MG/ML IV SOLN
15.0000 mL | Freq: Once | INTRAVENOUS | Status: AC | PRN
  Administered 2015-11-23: 15 mL via INTRAVENOUS

## 2015-11-23 NOTE — Therapy (Signed)
[place], [place], [address] Phone: [phone]   Fax:  [phone]  Physical Therapy Treatment  Patient Details  Name: [name] [name] MRN: [id number] Date of Birth: [birth year] Referring Provider: Dr. Lurline Del  Encounter Date: 11/23/2015      PT End of Session - 11/23/15 1414    Visit Number 98  66 for lymph   Number of Visits 72  for lymph   Date for PT Re-Evaluation 12/10/15   Authorization - Visit Number 140   PT Start Time 1020   PT Stop Time 1106   PT Time Calculation (min) 46 min   Activity Tolerance Patient tolerated treatment well   Behavior During Therapy [facility] for tasks assessed/performed      Past Medical History:  Diagnosis Date  . Bone metastases ([place]) dx'd 05/2014  . Breast cancer ([place]) dx'd 2005/2011  . Peripheral vascular disease ([place]) 02/2010   blood clot related to porta cath  . PONV (postoperative nausea and vomiting)   . S/P radiation therapy 07/17/2014 through 08/02/2014    Left mediastinum, left seventh rib 3250 cGy in 13 sessions   . S/P radiation therapy 12/11/2014 through 12/22/2014    Left parietal calvarium 2400 cGy in 8 sessions   . Seizures ([place]) 2010   Isolated incident.    Past Surgical History:  Procedure Laterality Date  . AXILLARY LYMPH NODE DISSECTION  Dec. 2011  . BREAST LUMPECTOMY  2005  . MEDIASTINOTOMY CHAMBERLAIN MCNEIL Left 06/02/2013   Procedure: MEDIASTINOTOMY CHAMBERLAIN MCNEIL;  Surgeon: [name], MD;  Location: [place];  Service: Thoracic;  Laterality: Left;  LEFT ANTERIOR MEDIASTINOTOMY   . PORTACATH PLACEMENT  12/11  . removal portacath      There were no vitals filed for this visit.      Subjective Assessment - 11/23/15 1026    Subjective (P)  Coughing more with  it being the end of the Ibrance cycle.   Currently in Pain? (P)  Yes   Pain Score (P)  6    Pain Location (P)  Axilla   Pain Orientation (P)  Right   Pain Descriptors / Indicators (P)  Other (Comment)  fullness   Aggravating Factors  (P)  fullness, high humidity, heat   Pain Relieving Factors (P)  therapy   Pain Score (P)  3   Pain Location (P)  Groin   Pain Orientation (P)  Right   Pain Descriptors / Indicators (P)  Tightness   Aggravating Factors  (P)  moving a funny way                         [facility] Adult PT Treatment/Exercise - 11/23/15 0001      Manual Therapy   Myofascial Release right axilla with focus on scar tightness   Manual Lymphatic Drainage (MLD) In left sidelying, posterior interaxillary anastomosis and right axillo-inguinal anastomosis; in supine, short neck, left axilla and anterior interaxillary anastomosis, right groin and axillo-inguinal anastomosis, and right upper extremity from fingers to shoulder.  In right sidelying, left periscapular area toward left groin.   Other Manual Therapy soft tissue work at superior and entire aspect of right medial thigh in right sidelying and toward prone position for pain relief and muscle relaxation, gentle myofascial release                  PT Short Term Goals - 02/27/15 1011  PT SHORT TERM GOAL #1   Title pain with walking decreased >/= 25%   Time 3   Period Weeks   Status Achieved     PT SHORT TERM GOAL #2   Title pelvis stays in correct alignment for 3 weeks   Time 3   Period Weeks   Status Achieved           PT Long Term Goals - 08/14/15 1317      PT LONG TERM GOAL #1   Title indpendent with HEP   Time 12   Period Weeks   Status On-going     PT LONG TERM GOAL #2   Title pain with walking decreased >/= 80%   Time 12   Period Weeks   Status On-going     PT LONG TERM GOAL #3   Title go up and down steps with a step over step pattern   Time 6   Status Deferred     PT  LONG TERM GOAL #4   Title being able to last for 5 days without therapy and pain not going above a 8/10 due to decreased muscle soreness   Time 12   Period Weeks   Status Achieved           Long Term Clinic Goals - 11/23/15 1416      CC Long Term Goal  #2   Title Pt. will report swelling is adequately managed to enable ADL function at a consistent level.   Status On-going     CC Long Term Goal  #4   Title Pain/discomfort at right axilla area will be controlled at 6/10 or less.   Status On-going     CC Long Term Goal  #5   Title Patient will avoid infection by ongoing management of her lymphedema at right breast/axilla/upper arm areas.   Status On-going            Plan - 11/23/15 1415    Clinical Impression Statement Groin pain increased a little today after she moved awkwardly recently; helped by therapy, particularly by myofascial release technique.   Rehab Potential Good   Clinical Impairments Affecting Rehab Potential active cancer   PT Frequency 2x / week   PT Duration 12 weeks   PT Treatment/Interventions Manual techniques;Manual lymph drainage   PT Next Visit Plan Continue manual lymph drainage for right upper outer breast and for right UE; also for left rib pain; continue right thigh soft tissue work focused on medial right thigh with myofascial release   Consulted and Agree with Plan of Care Patient   PT Plan Continue manual lymph drainage, soft tissue work.      Patient will benefit from skilled therapeutic intervention in order to improve the following deficits and impairments:  Pain, Increased muscle spasms, Difficulty walking, Increased edema  Visit Diagnosis: Pain in right thigh  Lymphedema, not elsewhere classified     Problem List Patient Active Problem List   Diagnosis Date Noted  . 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 Zoster 04/04/2015  . Nausea with vomiting 11/18/2014  . Constipation 11/18/2014  . Left-sided thoracic back pain    . Bone metastases ([place]) 11/16/2014  . Back pain 11/15/2014  . Uncontrolled pain 11/14/2014  . 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 Chest wall pain 03/21/2014  . Abnormal LFTs (liver function tests) 09/12/2013  . Breast cancer of upper-inner quadrant of right female breast ([place]) 08/18/2013  . 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

## 2015-11-25 ENCOUNTER — Other Ambulatory Visit: Payer: Self-pay | Admitting: Oncology

## 2015-11-26 ENCOUNTER — Other Ambulatory Visit (HOSPITAL_BASED_OUTPATIENT_CLINIC_OR_DEPARTMENT_OTHER): Payer: 59

## 2015-11-26 ENCOUNTER — Ambulatory Visit (HOSPITAL_COMMUNITY)
Admission: RE | Admit: 2015-11-26 | Discharge: 2015-11-26 | Disposition: A | Discharge status: Home / Self Care | Payer: 59 | Source: Ambulatory Visit | Attending: Oncology | Admitting: Oncology

## 2015-11-26 ENCOUNTER — Ambulatory Visit: Payer: 59 | Admitting: Physical Therapy

## 2015-11-26 ENCOUNTER — Ambulatory Visit: Payer: 59

## 2015-11-26 DIAGNOSIS — M79651 Pain in right thigh: Secondary | ICD-10-CM

## 2015-11-26 DIAGNOSIS — C50211 Malignant neoplasm of upper-inner quadrant of right female breast: Secondary | ICD-10-CM

## 2015-11-26 DIAGNOSIS — J90 Pleural effusion, not elsewhere classified: Secondary | ICD-10-CM

## 2015-11-26 DIAGNOSIS — I2510 Atherosclerotic heart disease of native coronary artery without angina pectoris: Secondary | ICD-10-CM | POA: Insufficient documentation

## 2015-11-26 DIAGNOSIS — I700 Atherosclerosis of aorta: Secondary | ICD-10-CM | POA: Diagnosis not present

## 2015-11-26 DIAGNOSIS — I890 Lymphedema, not elsewhere classified: Secondary | ICD-10-CM

## 2015-11-26 DIAGNOSIS — C7951 Secondary malignant neoplasm of bone: Secondary | ICD-10-CM | POA: Insufficient documentation

## 2015-11-26 DIAGNOSIS — M899 Disorder of bone, unspecified: Secondary | ICD-10-CM | POA: Diagnosis not present

## 2015-11-26 DIAGNOSIS — C771 Secondary and unspecified malignant neoplasm of intrathoracic lymph nodes: Secondary | ICD-10-CM | POA: Diagnosis present

## 2015-11-26 LAB — CBC WITH DIFFERENTIAL/PLATELET
BASO%: 2.3 % — ABNORMAL HIGH (ref 0.0–2.0)
Basophils Absolute: 0.1 10*3/uL (ref 0.0–0.1)
EOS%: 1.5 % (ref 0.0–7.0)
Eosinophils Absolute: 0.1 10*3/uL (ref 0.0–0.5)
HEMATOCRIT: 34.9 % (ref 34.8–46.6)
HGB: 12.6 g/dL (ref 11.6–15.9)
LYMPH%: 25.9 % (ref 14.0–49.7)
MCH: 36.7 pg — AB (ref 25.1–34.0)
MCHC: 36.1 g/dL — AB (ref 31.5–36.0)
MCV: 101.7 fL — AB (ref 79.5–101.0)
MONO#: 0.3 10*3/uL (ref 0.1–0.9)
MONO%: 9.6 % (ref 0.0–14.0)
NEUT%: 60.7 % (ref 38.4–76.8)
NEUTROS ABS: 2.1 10*3/uL (ref 1.5–6.5)
PLATELETS: 191 10*3/uL (ref 145–400)
RBC: 3.43 10*6/uL — AB (ref 3.70–5.45)
RDW: 14 % (ref 11.2–14.5)
WBC: 3.4 10*3/uL — AB (ref 3.9–10.3)
lymph#: 0.9 10*3/uL (ref 0.9–3.3)
nRBC: 0 % (ref 0–0)

## 2015-11-26 LAB — GLUCOSE, CAPILLARY: GLUCOSE-CAPILLARY: 90 mg/dL (ref 65–99)

## 2015-11-26 MED ORDER — FLUDEOXYGLUCOSE F - 18 (FDG) INJECTION
8.9400 | Freq: Once | INTRAVENOUS | Status: AC | PRN
  Administered 2015-11-26: 8.94 via INTRAVENOUS

## 2015-11-26 NOTE — Therapy (Signed)
[place], [place], [address] Phone: [phone]   Fax:  [phone]  Physical Therapy Treatment  Patient Details  Name: [name] [name] MRN: [id number] Date of Birth: [birth year] Referring Provider: Dr. Lurline Del  Encounter Date: 11/26/2015      PT End of Session - 11/26/15 1159    Visit Number 99  67 for lymph   Number of Visits 72  for lymph   Date for PT Re-Evaluation 12/10/15   Authorization - Visit Number 140   PT Start Time 1020   PT Stop Time 1104   PT Time Calculation (min) 44 min   Activity Tolerance Patient tolerated treatment well   Behavior During Therapy [facility] for tasks assessed/performed      Past Medical History:  Diagnosis Date  . Bone metastases ([place]) dx'd 05/2014  . Breast cancer ([place]) dx'd 2005/2011  . Peripheral vascular disease ([place]) 02/2010   blood clot related to porta cath  . PONV (postoperative nausea and vomiting)   . S/P radiation therapy 07/17/2014 through 08/02/2014    Left mediastinum, left seventh rib 3250 cGy in 13 sessions   . S/P radiation therapy 12/11/2014 through 12/22/2014    Left parietal calvarium 2400 cGy in 8 sessions   . Seizures ([place]) 2010   Isolated incident.    Past Surgical History:  Procedure Laterality Date  . AXILLARY LYMPH NODE DISSECTION  Dec. 2011  . BREAST LUMPECTOMY  2005  . MEDIASTINOTOMY CHAMBERLAIN MCNEIL Left 06/02/2013   Procedure: MEDIASTINOTOMY CHAMBERLAIN MCNEIL;  Surgeon: [name], MD;  Location: [place];  Service: Thoracic;  Laterality: Left;  LEFT ANTERIOR MEDIASTINOTOMY   . PORTACATH PLACEMENT  12/11  . removal portacath      There were no vitals filed for this visit.      Subjective Assessment - 11/26/15 1024    Subjective walked some Friday,  Saturday and Sunday; hand swelling is down some from yesterday   Currently in Pain? Yes   Pain Score 6    Pain Location Axilla   Pain Orientation Right   Pain Descriptors / Indicators Other (Comment)  fullness   Aggravating Factors  high humidity   Pain Relieving Factors therapy   Pain Score 3   Pain Location Groin   Pain Orientation Right   Pain Descriptors / Indicators Other (Comment)  pain   Aggravating Factors  walking   Pain Relieving Factors therapy                         [facility] Adult PT Treatment/Exercise - 11/26/15 0001      Manual Therapy   Myofascial Release right axilla with focus on scar tightness   Manual Lymphatic Drainage (MLD) In left sidelying, posterior interaxillary anastomosis and right axillo-inguinal anastomosis; in supine, short neck, left axilla and anterior interaxillary anastomosis, right groin and axillo-inguinal anastomosis, and right upper extremity from fingers to shoulder.  In right sidelying, left periscapular area toward left groin.   Other Manual Therapy soft tissue work at superior and entire aspect of right medial thigh in prone position for pain relief and muscle relaxation, gentle myofascial release                  PT Short Term Goals - 02/27/15 1011      PT SHORT TERM GOAL #1   Title pain with walking decreased >/= 25%   Time 3   Period Weeks  Status Achieved     PT SHORT TERM GOAL #2   Title pelvis stays in correct alignment for 3 weeks   Time 3   Period Weeks   Status Achieved           PT Long Term Goals - 08/14/15 1317      PT LONG TERM GOAL #1   Title indpendent with HEP   Time 12   Period Weeks   Status On-going     PT LONG TERM GOAL #2   Title pain with walking decreased >/= 80%   Time 12   Period Weeks   Status On-going     PT LONG TERM GOAL #3   Title go up and down steps with a step over step pattern   Time 6   Status Deferred     PT LONG TERM GOAL #4   Title being able to  last for 5 days without therapy and pain not going above a 8/10 due to decreased muscle soreness   Time 12   Period Weeks   Status Achieved           Long Term Clinic Goals - 11/23/15 1416      CC Long Term Goal  #2   Title Pt. will report swelling is adequately managed to enable ADL function at a consistent level.   Status On-going     CC Long Term Goal  #4   Title Pain/discomfort at right axilla area will be controlled at 6/10 or less.   Status On-going     CC Long Term Goal  #5   Title Patient will avoid infection by ongoing management of her lymphedema at right breast/axilla/upper arm areas.   Status On-going            Plan - 11/26/15 1200    Clinical Impression Statement groin pain again a little increased today, this time from doing a bit more walking over the past three days   Rehab Potential Good   Clinical Impairments Affecting Rehab Potential active cancer   PT Frequency 2x / week   PT Duration 12 weeks   PT Treatment/Interventions Manual techniques;Manual lymph drainage   PT Next Visit Plan Continue manual lymph drainage for right upper outer breast and for right UE; also for left rib pain; continue right thigh soft tissue work focused on medial right thigh with myofascial release   Consulted and Agree with Plan of Care Patient   PT Plan Continue manual lymph drainage, soft tissue work.      Patient will benefit from skilled therapeutic intervention in order to improve the following deficits and impairments:  Pain, Increased muscle spasms, Difficulty walking, Increased edema  Visit Diagnosis: Pain in right thigh  Lymphedema, not elsewhere classified     Problem List Patient Active Problem List   Diagnosis Date Noted  . 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 Zoster 04/04/2015  . Nausea with vomiting 11/18/2014  . Constipation 11/18/2014  . Left-sided thoracic back pain   . Bone metastases ([place]) 11/16/2014  . Back pain 11/15/2014  .  Uncontrolled pain 11/14/2014  . 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 Chest wall pain 03/21/2014  . Abnormal LFTs (liver function tests) 09/12/2013  . Breast cancer of upper-inner quadrant of right female breast ([place]) 08/18/2013  . 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

## 2015-11-26 NOTE — Progress Notes (Signed)
[facility]  Telephone:([phone]) [phone] Fax:(336) [phone]     ID: [name] [name] OB: [birth year]  MR#: [id number]  MOL#:[id number]  PCP: Pcp Not In System GYN:  [name] SU:  OTHER MD: [name], [name], [name], [name], [name]  CHIEF COMPLAINT: Stage IV breast cancer  CURRENT TREATMENT: Fulvestrant, denosumab, palbociclib  BREAST CANCER HISTORY: From doctor [name]'s intake note 03/20/2004:  "The patient is a very pleasant 55 year old female, without significant past medical history.  Her family history is significant for a sister who at age 23 was diagnosed with invasive ductal carcinoma.  She is a breast cancer survivor at age 66 now.  The patient states that she has never really had a screening mammogram until October 2005, when she felt that it was time for her to start having mammograms done on a yearly basis.  Therefore, on 01/26/04, she underwent a screening mammogram and an abnormality was detected in the upper outer right breast.  She, therefore, underwent spot compression views of both the right and the left breast.  The left breast revealed a well-defined mass in the upper outer left quadrant, present at the 2 o'clock position, measuring 1.8 cm, 6 cm from the nipple.  This, by ultrasound, was felt to be a simple cyst measuring 1.8 cm.  On the right breast, a spiculated mass was noted in the upper outer right quadrant.  The ultrasound revealed a shadowing irregular solid mass at the 10:30 position, 9 cm from the nipple, measuring 1.2 cm in greatest dimension, correlating with the spiculated mass seen on the mammogram.  The right axilla was negative ultrasonically.  Because of this, the patient underwent a needle biopsy of the right breast and the biopsy was positive invasive mammary carcinoma that showed features consistent with a high-grade invasive ductal carcinoma associated with desmoplastic stroma.  No in  situ component was seen and no definite lymphovascular invasion was identified.  On the core biopsy, the tumor measured about 0.8 cm.  Because of this, she was seen by Dr. [name] and the patient was taken to the [facility] on March 15, 2004.  She underwent a right breast lumpectomy with sentinel node biopsy.  The final pathology revealed an invasive ductal carcinoma, measuring 1.7 cm, grade 2 of 3.  Margins were free of tumor.  Atypical lobular hyperplasia was noted.  One sentinel node was removed which was negative for metastatic disease.  The tumor was staged at T1c, N0 MX.  It was estrogen receptor positive, progesterone receptor positive.  HER-2/neu was 2+.  FISH was negative.  All margins were free of tumor.  She is now seen in Medical Oncology for further evaluation and management of this newly diagnosed T1c, node negative, stage I, invasive ductal carcinoma of the right breast."  Her subsequent history is as detailed below  INTERVAL HISTORY: [name] [name] returns today accompanied by her husband [name] for follow-up of her metastatic estrogen receptor positive breast cancer. After completing 3 cycles of palbociclib at 75 mg 21 days on 7 days off (together with letrozole and denosumab/Xgeva) she just had restaging studies with an abdominal MRI and PET scan. The abdominal MRI shows stable to slightly progressive disease. The PET scan shows uniformly decreased uptake in the visceral disease.  REVIEW OF SYiSTEMS: [name] [name] has had excellent counts throughout with the palbociclib and no further dose reductions have been needed, but she does have significant side effects. She has a dry cough which is  productive of clear phlegm in the morning. She feels fatigued. She has cramps which worsens throughout the cycle and particular affect her at night. She has been nauseated. She has alternating diarrhea and constipation. She has infrequent but uncomfortable headaches. She can be dizzy sometimes and see spots. She has  had problems with epistaxis. She has had no fever, and the only rash she has had is in the right shoulder blade area. Her appetite is poor. His sense of taste is altered. The peripheral neuropathy in her feet and hands is not changed. The only other new symptom is a pain in the left side which she feels she can palpate. A detailed review of systems today was otherwise stable  PAST MEDICAL HISTORY: Past Medical History:  Diagnosis Date  . Bone metastases ([facility]) dx'd 05/2014  . Breast cancer ([facility]) dx'd 2005/2011  . Peripheral vascular disease ([facility]) 02/2010   blood clot related to porta cath  . PONV (postoperative nausea and vomiting)   . S/P radiation therapy 07/17/2014 through 08/02/2014    Left mediastinum, left seventh rib 3250 cGy in 13 sessions   . S/P radiation therapy 12/11/2014 through 12/22/2014    Left parietal calvarium 2400 cGy in 8 sessions   . Seizures ([facility]) 2010   Isolated incident.    PAST SURGICAL HISTORY: Past Surgical History:  Procedure Laterality Date  . AXILLARY LYMPH NODE DISSECTION  Dec. 2011  . BREAST LUMPECTOMY  2005  . MEDIASTINOTOMY CHAMBERLAIN MCNEIL Left 06/02/2013   Procedure: MEDIASTINOTOMY CHAMBERLAIN MCNEIL;  Surgeon: [name], MD;  Location: [facility];  Service: Thoracic;  Laterality: Left;  LEFT ANTERIOR MEDIASTINOTOMY   . PORTACATH PLACEMENT  12/11  . removal portacath      FAMILY HISTORY Family History  Problem Relation Age of Onset  . COPD Mother   . Breast cancer Sister 67   The patient's father is living, 60 years old as of may 2015. He lives in [place]. The patient's mother died from complications of COPD at the age of 41. These has 2 brothers, one sister. Her sister developed breast cancer at the age of 40. She is doing well. The patient herself underwent genetic testing at [facility] in 2011  and was found to be BRCA negative  GYNECOLOGIC HISTORY:  Menarche age 35, she is GX P0. She stopped having periods with her initial chemotherapy in 2006.  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 Her husband [name] is a Medical illustrator in [place]. He has a child from a prior marriage. At home they have 2 rescue dogs, Hobo and [place]. The patient is religious but not a church attender    ADVANCED DIRECTIVES: In place; at the 08/04/2014 visit in particular the patient was very clear, with her husband present, that she would not want any kind of feeding tubes or "other tubes" if her condition deteriorated.   HEALTH MAINTENANCE: Social History  Substance Use Topics  . Smoking status: Never Smoker  . Smokeless tobacco: Never Used  . Alcohol use No     Colonoscopy:  PAP:  Bone density: March 2015; mild osteopenia  Lipid panel:  Allergies  Allergen Reactions  . 2nd Skin Quick Heal Other (See Comments)    Other Reaction: Skin peels  . Decadron [Dexamethasone] Other (See Comments)    Patient does not tolerate steroids.   . Dilaudid [Hydromorphone] Nausea And Vomiting  . Enoxaparin Other (See Comments)  unknown  . Fluconazole Swelling    Liver toxicity  . Hydromorphone Hcl Nausea And Vomiting  . Morphine And Related Nausea And Vomiting  . Protonix [Pantoprazole Sodium] Other (See Comments)    Patient reports it caused thrush.  . Tegaderm Ag Mesh [Silver]     Current Outpatient Prescriptions  Medication Sig Dispense Refill  . ALPRAZolam (XANAX) 0.5 MG tablet Take 1 tablet (0.5 mg total) by mouth 2 (two) times daily as needed for anxiety. 30 tablet 0  . B Complex-C (B-COMPLEX WITH VITAMIN C) tablet Take 1 tablet by mouth daily. Reported on 03/27/2015    . calcium carbonate (TUMS - DOSED IN MG ELEMENTAL CALCIUM) 500 MG chewable tablet Chew 1 tablet by mouth as directed.    . cholecalciferol 2000 UNITS  tablet Take 1 tablet (2,000 Units total) by mouth daily.    . Diphenhyd-Hydrocort-Nystatin (FIRST-DUKES MOUTHWASH) SUSP 5-10 ml qid SWISH AND SPIT 409 mL 3  . folic acid (FOLVITE) 1 MG tablet Take 1 tablet (1 mg total) by mouth daily.    Marland Kitchen loratadine-pseudoephedrine (CLARITIN-D 24-HOUR) 10-240 MG 24 hr tablet Take 1 tablet by mouth daily.    . Melatonin 3 MG TABS Take 3 mg by mouth at bedtime.    . naproxen sodium (ANAPROX) 220 MG tablet Take 220 mg by mouth 2 (two) times daily with a meal.    . palbociclib (IBRANCE) 75 MG capsule Take 1 capsule (75 mg total) by mouth daily with breakfast. Take whole with food. 21 capsule 6  . RABEprazole Sodium 5 MG CPSP Take 5 mg by mouth daily. 30 capsule 3  . saccharomyces boulardii (FLORASTOR) 250 MG capsule Take 250 mg by mouth daily.      No current facility-administered medications for this visit.     OBJECTIVE: Middle-aged white womanWho appears stated age  There were no vitals filed for this visit.   There is no height or weight on file to calculate BMI.   There were no vitals filed for this visit.   Patient refused vitals today 11/27/2015     ECOG FS: 1  Sclerae unicteric, pupils round and equal Oropharynx clear and moist-- no thrush or other lesions No cervical or supraclavicular adenopathy Lungs no rales or rhonchi, decreased breath sounds on left base Heart regular rate and rhythm Abd soft, nontender, positive bowel sounds, no hepatomegaly by palpation MSK no focal spinal tenderness; I do not palpate any mass in the left chest wall area where she has tenderness Neuro: nonfocal, well oriented, appropriate affect Breasts: Deferred       LAB RESULTS:   CMP     Component Value Date/Time   NA 137 11/19/2015 1146   K 3.8 11/19/2015 1146   CL 103 12/14/2014 0800   CL 105 05/06/2012 1333   CO2 26 11/19/2015 1146   GLUCOSE 82 11/19/2015 1146   GLUCOSE 124 (H) 05/06/2012 1333   BUN 10.3 11/19/2015 1146   CREATININE 0.9 11/19/2015  1146   CALCIUM 9.8 11/19/2015 1146   PROT 7.6 11/19/2015 1146   ALBUMIN 4.0 11/19/2015 1146   AST 25 11/19/2015 1146   ALT 28 11/19/2015 1146   ALKPHOS 65 11/19/2015 1146   BILITOT 0.44 11/19/2015 1146   GFRNONAA >60 12/14/2014 0800   GFRAA >60 12/14/2014 0800    No results found for: SPEP  Lab Results  Component Value Date   WBC 3.4 (L) 11/26/2015   NEUTROABS 2.1 11/26/2015   HGB 12.6 11/26/2015   HCT 34.9 11/26/2015  MCV 101.7 (H) 11/26/2015   PLT 191 11/26/2015      Chemistry      Component Value Date/Time   NA 137 11/19/2015 1146   K 3.8 11/19/2015 1146   CL 103 12/14/2014 0800   CL 105 05/06/2012 1333   CO2 26 11/19/2015 1146   BUN 10.3 11/19/2015 1146   CREATININE 0.9 11/19/2015 1146      Component Value Date/Time   CALCIUM 9.8 11/19/2015 1146   ALKPHOS 65 11/19/2015 1146   AST 25 11/19/2015 1146   ALT 28 11/19/2015 1146   BILITOT 0.44 11/19/2015 1146     No results for input(s): INR in the last 168 hours.  Urinalysis    Component Value Date/Time   COLORURINE YELLOW 11/17/2014 0143   APPEARANCEUR CLOUDY (A) 11/17/2014 0143   LABSPEC 1.010 11/17/2014 0143   LABSPEC 1.005 09/12/2013 1542   PHURINE 6.5 11/17/2014 0143   GLUCOSEU NEGATIVE 11/17/2014 0143   GLUCOSEU Negative 09/12/2013 1542   HGBUR NEGATIVE 11/17/2014 0143   BILIRUBINUR NEGATIVE 11/17/2014 0143   BILIRUBINUR Negative 09/12/2013 1542   KETONESUR NEGATIVE 11/17/2014 0143   PROTEINUR NEGATIVE 11/17/2014 0143   UROBILINOGEN 0.2 11/17/2014 0143   UROBILINOGEN 0.2 09/12/2013 1542   NITRITE NEGATIVE 11/17/2014 0143   LEUKOCYTESUR NEGATIVE 11/17/2014 0143   LEUKOCYTESUR Negative 09/12/2013 1542  Results for SHAKIRAH, KIRKEY (MRN [id number]) as of 11/27/2015 16:23  Ref. Range 07/30/2015 11:38 08/28/2015 11:53 09/24/2015 11:52 10/22/2015 11:49 11/19/2015 11:46  CA 27.29 Latest Ref Range: 0.0 - 38.6 U/mL 1,720.6 (H) 1,679.3 (H) 1,571.0 (H) 1,748.2 (H) 1,572.2 (H)    STUDIES: Mr Liver W Wo  Contrast  Result Date: 11/23/2015 CLINICAL DATA:  Breast cancer. Mediastinal and abdominal nodal metastasis. Bone metastasis. EXAM: MRI ABDOMEN WITHOUT AND WITH CONTRAST TECHNIQUE: Multiplanar multisequence MR imaging of the abdomen was performed both before and after the administration of intravenous contrast. CONTRAST:  50m MULTIHANCE GADOBENATE DIMEGLUMINE 529 MG/ML IV SOLN COMPARISON:  PET of 09/05/2015.  Abdominal MRI of 08/06/2015. FINDINGS: Portions of exam, especially the pre and postcontrast dynamic images, are motion degraded. Lower chest: Pleural-parenchymal metastasis within the inferior left hemi thorax. This measures 1.5 cm maximally on image 7/ series 12001. Most directly compared to the prior MRI, measures similar (when remeasured). Hepatobiliary: Again identified are hepatic metastasis. These are compared on series 12001 and 1202 today to similar post-contrast images from the MRI of 08/06/2015 (more direct comparison than to the cross modality recent PET). Index anterior right hepatic lobe lesion measures 3.9 x 2.7 cm on image 38/ series 12001. Compare 3.8 x 2.6 cm on the prior exam (when remeasured). More central right hepatic lobe lesion measures 2.2 x 2.5 cm on image 34/ series 12001. Compare 1.8 x 1.8 cm on the prior MRI (when remeasured). High a right hepatic lobe 1.8 cm lesion image 8/series 12002 measures 1.5 cm on the prior MRI (when remeasured). A more posterior high right hepatic lobe 2.0 cm lesion image 14/ series 12002 is enlarged from 1.6 cm on the prior MRI. Isolated gallstone without acute cholecystitis.  1.5 cm Pancreas: Normal, without mass or ductal dilatation. Spleen: Normal in size, without focal abnormality. Adrenals/Urinary Tract: Normal adrenal glands. Normal kidneys, without hydronephrosis. Stomach/Bowel: Normal stomach and abdominal bowel loops. Vascular/Lymphatic: Normal caliber of the aorta and branch vessels. Circumaortic left renal vein. Left periaortic  retroperitoneal nodal metastasis measures 1.1 cm on image 50/series 12001. This is unchanged when compared to the MRI of 08/06/2015 and the PET of 09/05/2015.  A portal caval node measures 10 mm on image 28/series 4 versus 12 mm on the prior PET. Other: No ascites.  No evidence of omental or peritoneal disease. Musculoskeletal: Again identified are enhancing osseous lesions consistent with metastasis. The right-sided T11 lesion measures 1.3 cm on image 30/ series 12001. Compare 9 mm on 08/06/2015. A right-sided lamina lesion at L1 measures 12 mm on image 54/ series 12001 and is similar to the prior MRI (when remeasured). A lesion within the inferior aspect of approximately L3 is similar to on the prior exam including on image 39/series 13. Also not significantly changed back to 05/30/2015. IMPRESSION: 1. Since the MRI of 08/06/2015, mild progression of hepatic metastasis. 2. Similar left periaortic abdominal nodal metastasis. A portal caval node is slightly decreased in size compared to the PET of 09/05/2015. 3. Minimal progression of osseous metastasis since the 08/06/2015 MRI. 4. Similar but incompletely imaged pleural parenchymal metastasis within the inferior left hemi thorax. 5. Mildly motion degraded exam. 6. Cholelithiasis. Electronically Signed   By: Abigail Miyamoto M.D.   On: 11/23/2015 16:59   Nm Pet Image Restag (ps) Skull Base To Thigh  Result Date: 11/26/2015 CLINICAL DATA:  Subsequent treatment strategy for restaging of metastatic breast cancer. Right breast primary. Metastatic disease to mediastinal nodes, left pleural space, retroperitoneum, liver, and bones. Re-evaluate. EXAM: NUCLEAR MEDICINE PET SKULL BASE TO THIGH TECHNIQUE: 8.9 mCi F-18 FDG was injected intravenously. Full-ring PET imaging was performed from the skull base to thigh after the radiotracer. CT data was obtained and used for attenuation correction and anatomic localization. FASTING BLOOD GLUCOSE:  Value: 93 mg/dl COMPARISON:  Prior  PET 09/05/2015.  Abdominal MRI of 3 days ago. FINDINGS: NECK No areas of abnormal hypermetabolism. CHEST Left axillary node measures 7 mm and a S.U.V. max of 2.4 on image 83/ series 4. Compare 11 mm and a S.U.V. max of 6.5 previously. A prevascular node measures 9 mm and a S.U.V. max of 5.2 on image 83/series 4. Compare 6 mm and a S.U.V. max of 5.0 on the prior. Pleural-based left lower lobe lung hypermetabolism is less well-defined today. Measures a S.U.V. max of 4.5, including on image 104/series 4. On the prior, this measures a S.U.V. max of 7.8. Left anterior medial pleural hypermetabolism measures a S.U.V. max of 6.3 today versus a S.U.V. max of 8.2 on the prior exam (when remeasured). A left-sided mediastinal node measures 8 mm and a S.U.V. max of 4.4 on image 85/ series 4. Compare 9 mm and a S.U.V. max of 6.6 on the prior. ABDOMEN/PELVIS Multifocal hepatic hypermetabolism. The largest area of hypermetabolism is within the anterior portion right hepatic lobe. This is somewhat less well-defined today, measuring 4.1 cm and a S.U.V. max of 7.4 on image 117/series 4. Compare 4.2 cm and a S.U.V. max of 7.9 on the prior. Caudate lobe focus of hypermetabolism measures a S.U.V. max of 5.5 today versus a S.U.V. max of 8.3 on the prior. Central right hepatic lobe metastasis measures 1.9 cm and a S.U.V. max of 5.2 today versus 1.9 cm and a S.U.V. max of 7.3 on the prior. The left periaortic retroperitoneal node measures 10 mm and a S.U.V. max of 4.5 on image 130/series 4. Compare 11 mm and a S.U.V. max of 6.5 on the prior PET. A portal caval node measures 9 mm and a S.U.V. max of 3.8 on image 126/series 4. Compare 12 mm and a S.U.V. max of 7.7 on the prior PET. SKELETON Hypermetabolic focus within  the right posterior portion of the T11 vertebral body measures a S.U.V. max of 11.5 today versus a S.U.V. max of 7.8 on the prior. Low-level hypermetabolism about the right inferior pubic ramus measures a S.U.V. max of 6.8  today versus a S.U.V. max of 3.8 previously. CT IMAGES PERFORMED FOR ATTENUATION CORRECTION Lytic lesions in the skull, including a left parietal 19 mm lesion, similar. No cervical adenopathy. Right axillary node dissection. Lad coronary artery atherosclerosis, including on image 92/series 4. 6 mm nodule in the right upper or right middle lobe is similar including on image 85/series 4. Radiation fibrosis in the left apex. Rounded atelectasis in the left lower lobe. Circumaortic left renal vein. Abdominal aortic atherosclerosis. Mild pelvic floor laxity. IMPRESSION: 1. Mild metabolic response to therapy within the chest. 2. Mild metabolic response to therapy within the liver and abdominal nodal stations. 3. Increased hypermetabolism corresponding to osseous metastasis, including a dominant right-sided T11 lesion. 4. No new sites of disease. 5.  Coronary artery atherosclerosis. Aortic atherosclerosis. Electronically Signed   By: Abigail Miyamoto M.D.   On: 11/26/2015 15:15    ASSESSMENT: 55 y.o. BRCA negative [place] woman with stage IV breast cancer, history as follows  (1)  S/p Right upper inner quadrant lumpectomy and sentinel lymph node sampling 03/15/2004 for a pT1c pN0. Stage IA invasive ductal carcinoma, grade 2, estrogen receptor 95% positive, progesterone receptor 65% positive, HER-2 not amplified; additional surgery 04/25/2004 for seroma or clearance showed no residual tumor  (2) adjuvant chemotherapy with cyclophosphamide and doxorubicin every 21 days x4 completed 07/19/2004  (3) adjuvant radiation given under Dr. Donella Stade in [place] completed July 2006  (4) the patient opted against adjuvant antiestrogen therapy  (5) genetics testing showed no BRCA mutations  (6) biopsy of a palpable right axillary mass 10/24/2009 showed invasive ductal carcinoma, grade 3, estrogen receptor 100% positive, progesterone receptor 2% positive (alert score 5) HER-2 negative; no evidence of systemic disease on  PET scanning  (7) completed 3 of 4 planned cycles of docetaxel and cyclophosphamide September 2011, fourth cycle omitted because of marked elevations in liver function tests  (8) an right axillary lymph node dissection 03/06/2010 showed 3/8 lymph nodes removed to be involved by tumor, with extracapsular extension.  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, TTS-1 negative), estrogen receptor 80% positive, progesterone receptor 2% positive, HER-2 not amplified  (12) letrozole started March 2015-- tolerated with significant side effects, discontinued at the end of May 2015  (13) PET scan 08/16/2013 shows extensive left pleural metastatic disease and a large left pleural effusion that shifts cardiac and mediastinal structures to the right; adenopathy (celiac trunk, periadrenal, periaortic); and a left medial clavicular lesion; Status post left thoracentesis 08/16/2013 positive for adenocarcinoma, estrogen receptor positive, progesterone receptor negative.  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 treated with Lovenox for 5-6 months  (c) right upper extremity lymphedema--receiving physical therapy  (d) hepatic steatosis with chronically elevated LFTs as well as unusual hepatic sensitivity to chemotherapy  (e) osteopenia with the lowest T score -1.6 on bone density scan 06/20/2013  (i) on denosumab/ Xgeva Q28d  (f) radiation oncology (Dr Valere Dross) has reviewed prior radiation records in case there is further mediastinal involvement with dysphagia etc in which case palliative XRT could be considered  (a) radiation to left mediastinum/ left 7th rib 3250 cGy in 13 sessions04/18/2016 through 08/02/2014  (b) radiation to T11 area: 22 Gy in 7 sessions, last dose 11/27/2014  (c) radiation left parietal scalp region to be completed 12/22/2014  (d) radiation to sacral area completed 04/09/2015  (g) chest wall and perineal pain--improved post radiation treatments  (a) discussed celebrex/ carafate but holding off for  now  (h) zoster diagnosed 04/04/2015-- on valacyclovir   PLAN: I spent a little over 60 minutes today with [name] [name] and [name] going over her situation in detail. We first reviewed her abdominal MRI and noted every instance where a lesion had increased and bilateral much it had increased. We also noted that several lesions have remained stable. The largest increase in any lesion was 0.7 cm.  We then reviewed the PET scan and the important point there is that every soft tissue lesion showed decreased uptake. The only lesions that showed increased uptake where the bone lesions and of course this is not measurable: In the bone increased uptake may indicate healing or disease and it is impossible to tell which this.  The bottom line is that this is very encouraging. Levada verbalize did best when she said this is the first time that we have a PET scan that shows improvement in uptake throughout. The CA-27-29 is also stable  Accordingly even though she is having significant side effects as noted above the plan is to continue with the current treatment for another 3 months and then restage.  We did discuss the possibility of increasing the palbociclib dose to 100 mg but Marieme thought she would not be able to tolerate that.  We also discussed the possibility of pembrolizumab. She will need to fill out some forms to see if we can obtain this drug as compassionate release. She will do that at a different visit. Note that she is ER CA negative and therefore will not qualify for olaparib  She would like to consider irradiating the area of uptake in her left rib cage. Perhaps a single dose would take care of the pain there. I am  making a referral to Dr. Sondra Come in radiation oncology to consider this.  Otherwise we are continuing the fulvestrant, palbociclib, and Xgeva as before. We went ahead and made the appropriate appointments for September, October and November and sent her up for restaging studies the first week  in December.  She knows to call for any other problems that may develop before the next visit here.   Chauncey Cruel, MD   11/27/2015 5:39 PM

## 2015-11-27 ENCOUNTER — Ambulatory Visit (HOSPITAL_BASED_OUTPATIENT_CLINIC_OR_DEPARTMENT_OTHER): Payer: 59 | Admitting: Oncology

## 2015-11-27 DIAGNOSIS — Z86718 Personal history of other venous thrombosis and embolism: Secondary | ICD-10-CM

## 2015-11-27 DIAGNOSIS — C7951 Secondary malignant neoplasm of bone: Secondary | ICD-10-CM

## 2015-11-27 DIAGNOSIS — C50211 Malignant neoplasm of upper-inner quadrant of right female breast: Secondary | ICD-10-CM

## 2015-11-27 MED ORDER — PALBOCICLIB 75 MG PO CAPS: 75.0000 mg | ORAL_CAPSULE | Freq: Every day | ORAL | 6 refills | Status: DC

## 2015-11-28 ENCOUNTER — Telehealth: Payer: Self-pay

## 2015-11-28 DIAGNOSIS — C7951 Secondary malignant neoplasm of bone: Secondary | ICD-10-CM

## 2015-11-28 DIAGNOSIS — C50211 Malignant neoplasm of upper-inner quadrant of right female breast: Secondary | ICD-10-CM

## 2015-11-28 NOTE — Telephone Encounter (Signed)
Ms [name] [name] called stating that the pain in the pubic area where she has a fracture  has increased from a 2 that she reported at visit yesterday.   She has not had a follow up x-ray of broken bone in pubic area.  She would like an x-ray of pubic to evaluate if increased pain is from fracture vs more cancer.  This information would be beneficial to bring to appointment with Dr. Sondra Come. Dr. Jana Hakim will order a pelvic x-ray.  Told Ms [name] [name] that she can go to [facility] radiology and have a pelvic X-ray done to evaluate the pubic fracture.

## 2015-11-29 ENCOUNTER — Ambulatory Visit (HOSPITAL_COMMUNITY)
Admission: RE | Admit: 2015-11-29 | Discharge: 2015-11-29 | Disposition: A | Discharge status: Home / Self Care | Payer: 59 | Source: Ambulatory Visit | Attending: Oncology | Admitting: Oncology

## 2015-11-29 ENCOUNTER — Other Ambulatory Visit: Payer: Self-pay | Admitting: *Deleted

## 2015-11-29 DIAGNOSIS — C7951 Secondary malignant neoplasm of bone: Secondary | ICD-10-CM | POA: Diagnosis not present

## 2015-11-29 DIAGNOSIS — C50211 Malignant neoplasm of upper-inner quadrant of right female breast: Secondary | ICD-10-CM | POA: Insufficient documentation

## 2015-11-30 ENCOUNTER — Encounter: Payer: Self-pay | Admitting: Oncology

## 2015-11-30 ENCOUNTER — Ambulatory Visit: Payer: 59 | Attending: Oncology | Admitting: Physical Therapy

## 2015-11-30 DIAGNOSIS — I890 Lymphedema, not elsewhere classified: Secondary | ICD-10-CM | POA: Diagnosis present

## 2015-11-30 DIAGNOSIS — M62838 Other muscle spasm: Secondary | ICD-10-CM | POA: Insufficient documentation

## 2015-11-30 DIAGNOSIS — M79651 Pain in right thigh: Secondary | ICD-10-CM | POA: Insufficient documentation

## 2015-11-30 NOTE — Progress Notes (Signed)
Faxed [employer] form for [place]- they sent form for dr. Jana Hakim for off label use. I left for dr. Jana Hakim to sign

## 2015-11-30 NOTE — Therapy (Signed)
[place], [place], [address] Phone: [phone]   Fax:  [phone]  Physical Therapy Treatment  Patient Details  Name: [name] [name] MRN: [id number] Date of Birth: 09/20/1960 Referring Provider: Dr. Lurline Del  Encounter Date: 11/30/2015      PT End of Session - 11/30/15 1229    Visit Number 100   Number of Visits 72   Date for PT Re-Evaluation 12/10/15   PT Start Time [id number]   PT Stop Time 1100   PT Time Calculation (min) 45 min   Activity Tolerance Patient tolerated treatment well   Behavior During Therapy [facility] for tasks assessed/performed      Past Medical History:  Diagnosis Date  . Bone metastases ([place]) dx'd 05/2014  . Breast cancer ([place]) dx'd 2005/2011  . Peripheral vascular disease ([place]) 02/2010   blood clot related to porta cath  . PONV (postoperative nausea and vomiting)   . S/P radiation therapy 07/17/2014 through 08/02/2014    Left mediastinum, left seventh rib 3250 cGy in 13 sessions   . S/P radiation therapy 12/11/2014 through 12/22/2014    Left parietal calvarium 2400 cGy in 8 sessions   . Seizures ([place]) 2010   Isolated incident.    Past Surgical History:  Procedure Laterality Date  . AXILLARY LYMPH NODE DISSECTION  Dec. 2011  . BREAST LUMPECTOMY  2005  . MEDIASTINOTOMY CHAMBERLAIN MCNEIL Left 06/02/2013   Procedure: MEDIASTINOTOMY CHAMBERLAIN MCNEIL;  Surgeon: [name], MD;  Location: [place];  Service: Thoracic;  Laterality: Left;  LEFT ANTERIOR MEDIASTINOTOMY   . PORTACATH PLACEMENT  12/11  . removal portacath      There were no vitals filed for this visit.      Subjective Assessment - 11/30/15 1019    Subjective has been having some increased swelling with the humidity, but notices it is decreasing  as the temperature is dropping    Currently in Pain? Yes   Pain Score 3    Pain Location Axilla   Pain Orientation Right                         [facility] Adult PT Treatment/Exercise - 11/30/15 0001      Manual Therapy   Manual Lymphatic Drainage (MLD) In left sidelying, posterior interaxillary anastomosis and right axillo-inguinal anastomosis; in supine, short neck, left axilla and anterior interaxillary anastomosis, right groin and axillo-inguinal anastomosis, and right upper extremity from fingers to shoulder.  In right sidelying, left periscapular area toward left groin.   Other Manual Therapy soft tissue work at superior and entire aspect of right medial thigh in prone position for pain relief and muscle relaxation, gentle myofascial release                  PT Short Term Goals - 02/27/15 1011      PT SHORT TERM GOAL #1   Title pain with walking decreased >/= 25%   Time 3   Period Weeks   Status Achieved     PT SHORT TERM GOAL #2   Title pelvis stays in correct alignment for 3 weeks   Time 3   Period Weeks   Status Achieved           PT Long Term Goals - 08/14/15 1317      PT LONG TERM GOAL #1   Title indpendent with HEP   Time 12   Period Weeks   Status  On-going     PT LONG TERM GOAL #2   Title pain with walking decreased >/= 80%   Time 12   Period Weeks   Status On-going     PT LONG TERM GOAL #3   Title go up and down steps with a step over step pattern   Time 6   Status Deferred     PT LONG TERM GOAL #4   Title being able to last for 5 days without therapy and pain not going above a 8/10 due to decreased muscle soreness   Time 12   Period Weeks   Status Achieved           Long Term Clinic Goals - 11/23/15 1416      CC Long Term Goal  #2   Title Pt. will report swelling is adequately managed to enable ADL function at a consistent level.   Status On-going     CC Long Term Goal  #4   Title Pain/discomfort at right  axilla area will be controlled at 6/10 or less.   Status On-going     CC Long Term Goal  #5   Title Patient will avoid infection by ongoing management of her lymphedema at right breast/axilla/upper arm areas.   Status On-going            Plan - 11/30/15 1229    Clinical Impression Statement pt reports relief after treatment especially from myofascial work at medical thigh    Rehab Potential Good   Clinical Impairments Affecting Rehab Potential active cancer   PT Frequency 2x / week   PT Duration 12 weeks   PT Treatment/Interventions Manual techniques;Manual lymph drainage   PT Next Visit Plan Continue manual lymph drainage for right upper outer breast and for right UE; also for left rib pain; continue right thigh soft tissue work focused on medial right thigh with myofascial release   PT Plan Continue manual lymph drainage, soft tissue work.      Patient will benefit from skilled therapeutic intervention in order to improve the following deficits and impairments:  Pain, Increased muscle spasms, Difficulty walking, Increased edema  Visit Diagnosis: Pain in right thigh  Other muscle spasm     Problem List Patient Active Problem List   Diagnosis Date Noted  . 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 Zoster 04/04/2015  . Nausea with vomiting 11/18/2014  . Constipation 11/18/2014  . Left-sided thoracic back pain   . Bone metastases ([place]) 11/16/2014  . Back pain 11/15/2014  . Uncontrolled pain 11/14/2014  . 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 Chest wall pain 03/21/2014  . Abnormal LFTs (liver function tests) 09/12/2013  . Breast cancer of upper-inner quadrant of right female breast ([place]) 08/18/2013  . 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 Owens Shark PT   [name] 11/30/2015, 12:32 PM  [place], [place],  [address] Phone: [phone]   Fax:  [phone]  Name: HANIFA STILLSON MRN: [id number] Date of Birth: 26-Jan-1961

## 2015-12-04 ENCOUNTER — Other Ambulatory Visit (HOSPITAL_BASED_OUTPATIENT_CLINIC_OR_DEPARTMENT_OTHER): Payer: 59

## 2015-12-04 ENCOUNTER — Telehealth: Payer: Self-pay | Admitting: Oncology

## 2015-12-04 ENCOUNTER — Ambulatory Visit: Payer: 59 | Admitting: Physical Therapy

## 2015-12-04 DIAGNOSIS — I890 Lymphedema, not elsewhere classified: Secondary | ICD-10-CM

## 2015-12-04 DIAGNOSIS — C771 Secondary and unspecified malignant neoplasm of intrathoracic lymph nodes: Secondary | ICD-10-CM

## 2015-12-04 DIAGNOSIS — M79651 Pain in right thigh: Secondary | ICD-10-CM | POA: Diagnosis not present

## 2015-12-04 DIAGNOSIS — C50211 Malignant neoplasm of upper-inner quadrant of right female breast: Secondary | ICD-10-CM | POA: Diagnosis not present

## 2015-12-04 DIAGNOSIS — J90 Pleural effusion, not elsewhere classified: Secondary | ICD-10-CM

## 2015-12-04 LAB — CBC WITH DIFFERENTIAL/PLATELET
BASO%: 2.8 % — ABNORMAL HIGH (ref 0.0–2.0)
BASOS ABS: 0.1 10*3/uL (ref 0.0–0.1)
EOS ABS: 0.1 10*3/uL (ref 0.0–0.5)
EOS%: 2.5 % (ref 0.0–7.0)
HEMATOCRIT: 33.6 % — AB (ref 34.8–46.6)
HEMOGLOBIN: 12 g/dL (ref 11.6–15.9)
LYMPH%: 24.6 % (ref 14.0–49.7)
MCH: 36.6 pg — AB (ref 25.1–34.0)
MCHC: 35.7 g/dL (ref 31.5–36.0)
MCV: 102.4 fL — AB (ref 79.5–101.0)
MONO#: 0.6 10*3/uL (ref 0.1–0.9)
MONO%: 19.9 % — ABNORMAL HIGH (ref 0.0–14.0)
NEUT#: 1.6 10*3/uL (ref 1.5–6.5)
NEUT%: 50.2 % (ref 38.4–76.8)
PLATELETS: 229 10*3/uL (ref 145–400)
RBC: 3.28 10*6/uL — ABNORMAL LOW (ref 3.70–5.45)
RDW: 14.2 % (ref 11.2–14.5)
WBC: 3.2 10*3/uL — ABNORMAL LOW (ref 3.9–10.3)
lymph#: 0.8 10*3/uL — ABNORMAL LOW (ref 0.9–3.3)
nRBC: 0 % (ref 0–0)

## 2015-12-04 NOTE — Telephone Encounter (Signed)
Left a message asking if [name] [name] would like to move her appointment up to 11:00 tomorrow with Dr. Sondra Come.  Requested a return call.

## 2015-12-04 NOTE — Progress Notes (Addendum)
Histology and Location of Primary Cancer: Stage IV breast cancer - right breast  Location(s) of Symptomatic Metastases: mediastinal nodes, left pleural space, retroperitoneum, liver, and bones.    Past/Anticipated chemotherapy by medical oncology, if any: Fulvestrant, denosumab, palbociclib  Pain on a scale of 0-10 is:   If Spine Met(s), symptoms, if any, include:  Bowel/Bladder retention or incontinence (please describe): no  Numbness or weakness in extremities (please describe): neuropathy in feet, cramping in legs from Ibrance  Current Decadron regimen, if applicable: none  Ambulatory status? Walker? Wheelchair?: ambulatory - has pain with walking and needs to rest a lot.  Also reports pain with walking on an incline.  The pain/burning radiates down the muscle in her right leg.  She is going to physical therapy.  SAFETY ISSUES:  Prior radiation? 2006 right breast with Dr. Donella Stade 2012 right axillary with Dr. Sandi Mealy  07/17/2014 through 08/02/2014 to Left mediastinum, left seventh rib 3250 cGy by Dr. Valere Dross 11/17/2014 through 11/27/2014  Lower thoracic spine 2200 cGy  by Dr. Valere Dross             12/11/2014 through 09/23/2016Left parietal calvarium 2400 cGy by Dr. Valere Dross 03/29/2015-04/09/2015 - right inferior pubic ramus 24 Gy by Dr. Tammi Klippel        Pacemaker/ICD? no  Possible current pregnancy? no  Is the patient on methotrexate? no  Current Complaints / other details:  Dr. Jana Hakim wants patient to be seen to consider irradiating the area of uptake in her left rib cage.  Patient reports the pain comes and goes in her left rib cage.  Patient reports having a fracture in her pubic ramus from radiation that has healed.  She reports having constant burning in her coccyx.  She is wondering whether radiation would be better or if ablation would be a better option.  She is  also reporting having pressure in the right side of her head/forehead.  She refused to have vital signs or her weight taken.  Patient wants to discuss her 1. Right skull met 2. Left rib cage 3. Pelvic 4. Coccyx.  She would like to discuss the SUV numbers on the PET scan.

## 2015-12-04 NOTE — Therapy (Signed)
[place], [place], [address] Phone: [phone]   Fax:  [phone]  Physical Therapy Treatment  Patient Details  Name: [name] [name] MRN: [id number] Date of Birth: [birth year] Referring Provider: Dr. Lurline Del  Encounter Date: 12/04/2015      PT End of Session - 12/04/15 1512    Visit Number [id number] for lymphedema   Number of Visits 72  for lymph   Date for PT Re-Evaluation 12/10/15   Authorization - Visit Number 140   PT Start Time 1301   PT Stop Time 1346   PT Time Calculation (min) 45 min   Activity Tolerance Patient tolerated treatment well   Behavior During Therapy [facility] for tasks assessed/performed      Past Medical History:  Diagnosis Date  . Bone metastases ([place]) dx'd 05/2014  . Breast cancer ([place]) dx'd 2005/2011  . Peripheral vascular disease ([place]) 02/2010   blood clot related to porta cath  . PONV (postoperative nausea and vomiting)   . S/P radiation therapy 07/17/2014 through 08/02/2014    Left mediastinum, left seventh rib 3250 cGy in 13 sessions   . S/P radiation therapy 12/11/2014 through 12/22/2014    Left parietal calvarium 2400 cGy in 8 sessions   . Seizures ([place]) 2010   Isolated incident.    Past Surgical History:  Procedure Laterality Date  . AXILLARY LYMPH NODE DISSECTION  Dec. 2011  . BREAST LUMPECTOMY  2005  . MEDIASTINOTOMY CHAMBERLAIN MCNEIL Left 06/02/2013   Procedure: MEDIASTINOTOMY CHAMBERLAIN MCNEIL;  Surgeon: [name], MD;  Location: [place];  Service: Thoracic;  Laterality: Left;  LEFT ANTERIOR MEDIASTINOTOMY   . PORTACATH PLACEMENT  12/11  . removal portacath      There were no vitals filed for this visit.      Subjective Assessment - 12/04/15 1306    Subjective "I have complaints  about [facility] (the garment fitter." I could've used the glove this weekend, but the hand is better now.   Currently in Pain? Yes   Pain Score 6    Pain Location Axilla   Pain Orientation Right   Pain Score 5   Pain Location Groin   Pain Orientation Right   Aggravating Factors  walking   Pain Relieving Factors therapy                         [facility] Adult PT Treatment/Exercise - 12/04/15 0001      Manual Therapy   Myofascial Release right axilla with focus on scar tightness   Manual Lymphatic Drainage (MLD) In left sidelying, posterior interaxillary anastomosis and right axillo-inguinal anastomosis; in supine, short neck, left axilla and anterior interaxillary anastomosis, right groin and axillo-inguinal anastomosis, and right upper extremity from fingers to shoulder.  In right sidelying, left periscapular area toward left groin.   Other Manual Therapy soft tissue work at superior and entire aspect of right medial thigh in right sidelying and toward prone position for pain relief and muscle relaxation, gentle myofascial release at upper thigh (longer today than other recent visits)                  PT Short Term Goals - 02/27/15 1011      PT SHORT TERM GOAL #1   Title pain with walking decreased >/= 25%   Time 3   Period Weeks   Status Achieved     PT SHORT TERM GOAL #2  Title pelvis stays in correct alignment for 3 weeks   Time 3   Period Weeks   Status Achieved           PT Long Term Goals - 08/14/15 1317      PT LONG TERM GOAL #1   Title indpendent with HEP   Time 12   Period Weeks   Status On-going     PT LONG TERM GOAL #2   Title pain with walking decreased >/= 80%   Time 12   Period Weeks   Status On-going     PT LONG TERM GOAL #3   Title go up and down steps with a step over step pattern   Time 6   Status Deferred     PT LONG TERM GOAL #4   Title being able to last for 5 days without therapy and pain not going above a  8/10 due to decreased muscle soreness   Time 12   Period Weeks   Status Achieved           Long Term Clinic Goals - 11/23/15 1416      CC Long Term Goal  #2   Title Pt. will report swelling is adequately managed to enable ADL function at a consistent level.   Status On-going     CC Long Term Goal  #4   Title Pain/discomfort at right axilla area will be controlled at 6/10 or less.   Status On-going     CC Long Term Goal  #5   Title Patient will avoid infection by ongoing management of her lymphedema at right breast/axilla/upper arm areas.   Status On-going            Plan - 12/04/15 1513    Clinical Impression Statement Patient with increased groin pain which benefited significantly from myofascial release technique.  Less induration at right upper outer breast, though still with fullness there.   Rehab Potential Good   Clinical Impairments Affecting Rehab Potential active cancer   PT Frequency 2x / week   PT Duration 12 weeks   PT Treatment/Interventions Manual techniques;Manual lymph drainage   PT Next Visit Plan Continue manual lymph drainage for right upper outer breast and for right UE; also for left rib pain; continue right thigh soft tissue work focused on medial right thigh with myofascial release   Consulted and Agree with Plan of Care Patient   PT Plan Continue manual lymph drainage, soft tissue work.      Patient will benefit from skilled therapeutic intervention in order to improve the following deficits and impairments:  Pain, Increased muscle spasms, Difficulty walking, Increased edema  Visit Diagnosis: Lymphedema, not elsewhere classified  Pain in right thigh     Problem List Patient Active Problem List   Diagnosis Date Noted  . 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 Zoster 04/04/2015  . Nausea with vomiting 11/18/2014  . Constipation 11/18/2014  . Left-sided thoracic back pain   . Bone metastases ([place]) 11/16/2014  . Back pain  11/15/2014  . Uncontrolled pain 11/14/2014  . 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 Chest wall pain 03/21/2014  . Abnormal LFTs (liver function tests) 09/12/2013  . Breast cancer of upper-inner quadrant of right female breast ([place]) 08/18/2013  . 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

## 2015-12-04 NOTE — Telephone Encounter (Signed)
Called [name] [name] to see if she would like to see Dr. Sondra Come tomorrow at 28 am.  [name] said she would and wants to make sure we know why she is coming for reconsult.  She said she is having pain in her coccyx, right frontal skull and left 7th rib.

## 2015-12-05 ENCOUNTER — Ambulatory Visit
Admission: RE | Admit: 2015-12-05 | Discharge: 2015-12-05 | Disposition: A | Discharge status: Home / Self Care | Payer: 59 | Source: Ambulatory Visit | Attending: Radiation Oncology | Admitting: Radiation Oncology

## 2015-12-05 DIAGNOSIS — C50919 Malignant neoplasm of unspecified site of unspecified female breast: Secondary | ICD-10-CM | POA: Diagnosis not present

## 2015-12-05 DIAGNOSIS — Z510 Encounter for antineoplastic radiation therapy: Secondary | ICD-10-CM | POA: Diagnosis present

## 2015-12-05 DIAGNOSIS — Z79899 Other long term (current) drug therapy: Secondary | ICD-10-CM | POA: Diagnosis not present

## 2015-12-05 DIAGNOSIS — C7951 Secondary malignant neoplasm of bone: Secondary | ICD-10-CM | POA: Insufficient documentation

## 2015-12-05 NOTE — Progress Notes (Signed)
Radiation Oncology         [phone] ________________________________  Name: [name] [name] MRN: [id number]  Date: 12/05/2015  DOB: Apr 04, 1960  Re-Evaluation Visit Note  CC: Pcp Not In System  Magrinat, Virgie Dad, MD    ICD-9-CM ICD-10-CM   1. Bone metastases (HCC) 198.5 C79.51     Diagnosis: Stage IV breast cancer  Interval Since Last Radiation: 8 months  03/29/2015 - 04/09/2015: Right inferior pubic ramus metastases were treated to 24 Gy in 4 fractions of 6 Gy.  12/11/2014 - 12/22/2014: Left parietal calvarium 2400 cGy in 8 fractions.  11/17/2014 - 11/27/2014: Lower thoracic spine (T11). 22 Gy in 7 fractions.  07/17/2014 - 08/02/2014: Left mediastinum, left seventh rib. 32.5 Gy in 13 fractions.  Completed March 2012: 45 Gy radiation to the right axillary and right supraclavicular nodal areas, with capecitabine sensitization, by Dr. Sandi Mealy at [facility]  Completed July 2006: Adjuvant radiation given under Dr. Donella Stade in [place] for Stage IA (pT1c, pN0) invasive ductal carcinoma, grade 2, (ER/PR +, HER2 -) of the right breast  Narrative:  The patient returns today for a re-evaluation. She is being treated with palliative chemotherapy by Dr. Jana Hakim. MRI of the liver on 11/23/15 showed mild progression of hepatic metastasis, similar left periaortic abdominal nodal metastasis, minimal progression of osseous metastasis, and similar but incompletely imaged pleural parenchymal metastasis within the inferior left hemithorax. PET scan on 11/26/15 showed mild metabolic response to therapy within the chest, liver, and abdominal nodal stations. However, PET showed increased hypermetabolism corresponding to osseous metastasis (including a dominant right-sided T11 lesion).  During her follow up with Dr. Jana Hakim on 11/27/15, she reports a new symptom of pain on her left side. The patient has been referred by Dr. Cleotis Nipper to consider irradiating the area of uptake in her left  rib cage. The patient also presents to discuss possible radiation to other areas.  On review of systems: The patient has left sided flank pain that comes and goes. She reports having a fracture in her pubic ramus from radiation that has since healed. She reports a sensation of constant burning in her coccyx and pressure in the right side of her forehead. The patient has pain with walking with the pain radiating down her right leg. She is going to physical therapy. She reports having myofascial release by physical therapy which helped some of her discomfort. She rests a lot. She reports neuropathy in her feet and leg cramps from [place].  ALLERGIES:  is allergic to 2nd skin quick heal; decadron [dexamethasone]; dilaudid [hydromorphone]; enoxaparin; fluconazole; hydromorphone hcl; morphine and related; protonix [pantoprazole sodium]; and tegaderm ag mesh [silver].  Meds: Current Outpatient Prescriptions  Medication Sig Dispense Refill  . ALPRAZolam (XANAX) 0.5 MG tablet Take 1 tablet (0.5 mg total) by mouth 2 (two) times daily as needed for anxiety. 30 tablet 0  . B Complex-C (B-COMPLEX WITH VITAMIN C) tablet Take 1 tablet by mouth daily. Reported on 03/27/2015    . calcium carbonate (TUMS - DOSED IN MG ELEMENTAL CALCIUM) 500 MG chewable tablet Chew 1 tablet by mouth as directed.    . cholecalciferol 2000 UNITS tablet Take 1 tablet (2,000 Units total) by mouth daily.    . folic acid (FOLVITE) 1 MG tablet Take 1 tablet (1 mg total) by mouth daily.    Marland Kitchen loratadine-pseudoephedrine (CLARITIN-D 24-HOUR) 10-240 MG 24 hr tablet Take 1 tablet by mouth daily.    . Melatonin 3 MG TABS Take 3 mg by mouth  at bedtime.    . naproxen sodium (ANAPROX) 220 MG tablet Take 220 mg by mouth 2 (two) times daily with a meal.    . palbociclib (IBRANCE) 75 MG capsule Take 1 capsule (75 mg total) by mouth daily with breakfast. Take whole with food. 21 capsule 6  . RABEprazole Sodium 5 MG CPSP Take 5 mg by mouth daily. 30  capsule 3  . saccharomyces boulardii (FLORASTOR) 250 MG capsule Take 250 mg by mouth daily.     . Diphenhyd-Hydrocort-Nystatin (FIRST-DUKES MOUTHWASH) SUSP 5-10 ml qid SWISH AND SPIT (Patient not taking: Reported on 12/05/2015) 240 mL 3   No current facility-administered medications for this encounter.     Physical Findings: The patient is in no acute distress. Patient is alert and oriented. Accompanied by husband on evaluation today The patient refused to have vital signs or her weight taken. Lungs are clear to auscultation bilaterally. Heart has regular rate and rhythm. No palpable cervical, supraclavicular, or axillary adenopathy. Abdomen soft, non-tender, normal bowel sounds. On neurological exam, motor strength 5/5 in the proximal and distal groups in the lower extremities. The patient has some mild tenderness to palpation in the lower lateral left rib cage area. The patient also has pain with palpation in the right ischial tuberosity area. This is the area most bothersome to the patient at this time.  Lab Findings: Lab Results  Component Value Date   WBC 3.2 (L) 12/04/2015   HGB 12.0 12/04/2015   HCT 33.6 (L) 12/04/2015   MCV 102.4 (H) 12/04/2015   PLT 229 12/04/2015    Radiographic Findings: Mr Liver W Wo Contrast  Result Date: 11/23/2015 CLINICAL DATA:  Breast cancer. Mediastinal and abdominal nodal metastasis. Bone metastasis. EXAM: MRI ABDOMEN WITHOUT AND WITH CONTRAST TECHNIQUE: Multiplanar multisequence MR imaging of the abdomen was performed both before and after the administration of intravenous contrast. CONTRAST:  26m MULTIHANCE GADOBENATE DIMEGLUMINE 529 MG/ML IV SOLN COMPARISON:  PET of 09/05/2015.  Abdominal MRI of 08/06/2015. FINDINGS: Portions of exam, especially the pre and postcontrast dynamic images, are motion degraded. Lower chest: Pleural-parenchymal metastasis within the inferior left hemi thorax. This measures 1.5 cm maximally on image 7/ series 12001. Most  directly compared to the prior MRI, measures similar (when remeasured). Hepatobiliary: Again identified are hepatic metastasis. These are compared on series 12001 and 1202 today to similar post-contrast images from the MRI of 08/06/2015 (more direct comparison than to the cross modality recent PET). Index anterior right hepatic lobe lesion measures 3.9 x 2.7 cm on image 38/ series 12001. Compare 3.8 x 2.6 cm on the prior exam (when remeasured). More central right hepatic lobe lesion measures 2.2 x 2.5 cm on image 34/ series 12001. Compare 1.8 x 1.8 cm on the prior MRI (when remeasured). High a right hepatic lobe 1.8 cm lesion image 8/series 12002 measures 1.5 cm on the prior MRI (when remeasured). A more posterior high right hepatic lobe 2.0 cm lesion image 14/ series 12002 is enlarged from 1.6 cm on the prior MRI. Isolated gallstone without acute cholecystitis.  1.5 cm Pancreas: Normal, without mass or ductal dilatation. Spleen: Normal in size, without focal abnormality. Adrenals/Urinary Tract: Normal adrenal glands. Normal kidneys, without hydronephrosis. Stomach/Bowel: Normal stomach and abdominal bowel loops. Vascular/Lymphatic: Normal caliber of the aorta and branch vessels. Circumaortic left renal vein. Left periaortic retroperitoneal nodal metastasis measures 1.1 cm on image 50/series 12001. This is unchanged when compared to the MRI of 08/06/2015 and the PET of 09/05/2015. A portal caval node  measures 10 mm on image 28/series 4 versus 12 mm on the prior PET. Other: No ascites.  No evidence of omental or peritoneal disease. Musculoskeletal: Again identified are enhancing osseous lesions consistent with metastasis. The right-sided T11 lesion measures 1.3 cm on image 30/ series 12001. Compare 9 mm on 08/06/2015. A right-sided lamina lesion at L1 measures 12 mm on image 54/ series 12001 and is similar to the prior MRI (when remeasured). A lesion within the inferior aspect of approximately L3 is similar to on the  prior exam including on image 39/series 13. Also not significantly changed back to 05/30/2015. IMPRESSION: 1. Since the MRI of 08/06/2015, mild progression of hepatic metastasis. 2. Similar left periaortic abdominal nodal metastasis. A portal caval node is slightly decreased in size compared to the PET of 09/05/2015. 3. Minimal progression of osseous metastasis since the 08/06/2015 MRI. 4. Similar but incompletely imaged pleural parenchymal metastasis within the inferior left hemi thorax. 5. Mildly motion degraded exam. 6. Cholelithiasis. Electronically Signed   By: Abigail Miyamoto M.D.   On: 11/23/2015 16:59   Dg Pelvis Comp Min 3v  Result Date: 11/29/2015 CLINICAL DATA:  History of metastatic breast cancer. Right-sided pelvic pain. EXAM: [facility] PELVIS - 3+ VIEW COMPARISON:  PET-CT 11/26/2015 FINDINGS: Mixed lytic and sclerotic area involving the right inferior pubic ramus consistent with metastatic disease. No pathologic fracture is identified. This is stable when compared to the prior CT scan. No hip or other definite pelvic lesions are identified. The pubic symphysis and SI joints are intact. IMPRESSION: Mixed lytic and sclerotic area involving the right inferior pubic ramus consistent with known metastatic disease. No pathologic fracture. No other definite pelvic bone lesions. Electronically Signed   By: Marijo Sanes M.D.   On: 11/29/2015 15:06   Nm Pet Image Restag (ps) Skull Base To Thigh  Result Date: 11/26/2015 CLINICAL DATA:  Subsequent treatment strategy for restaging of metastatic breast cancer. Right breast primary. Metastatic disease to mediastinal nodes, left pleural space, retroperitoneum, liver, and bones. Re-evaluate. EXAM: NUCLEAR MEDICINE PET SKULL BASE TO THIGH TECHNIQUE: 8.9 mCi F-18 FDG was injected intravenously. Full-ring PET imaging was performed from the skull base to thigh after the radiotracer. CT data was obtained and used for attenuation correction and anatomic localization.  FASTING BLOOD GLUCOSE:  Value: 93 mg/dl COMPARISON:  Prior PET 09/05/2015.  Abdominal MRI of 3 days ago. FINDINGS: NECK No areas of abnormal hypermetabolism. CHEST Left axillary node measures 7 mm and a S.U.V. max of 2.4 on image 83/ series 4. Compare 11 mm and a S.U.V. max of 6.5 previously. A prevascular node measures 9 mm and a S.U.V. max of 5.2 on image 83/series 4. Compare 6 mm and a S.U.V. max of 5.0 on the prior. Pleural-based left lower lobe lung hypermetabolism is less well-defined today. Measures a S.U.V. max of 4.5, including on image 104/series 4. On the prior, this measures a S.U.V. max of 7.8. Left anterior medial pleural hypermetabolism measures a S.U.V. max of 6.3 today versus a S.U.V. max of 8.2 on the prior exam (when remeasured). A left-sided mediastinal node measures 8 mm and a S.U.V. max of 4.4 on image 85/ series 4. Compare 9 mm and a S.U.V. max of 6.6 on the prior. ABDOMEN/PELVIS Multifocal hepatic hypermetabolism. The largest area of hypermetabolism is within the anterior portion right hepatic lobe. This is somewhat less well-defined today, measuring 4.1 cm and a S.U.V. max of 7.4 on image 117/series 4. Compare 4.2 cm and a S.U.V. max of  7.9 on the prior. Caudate lobe focus of hypermetabolism measures a S.U.V. max of 5.5 today versus a S.U.V. max of 8.3 on the prior. Central right hepatic lobe metastasis measures 1.9 cm and a S.U.V. max of 5.2 today versus 1.9 cm and a S.U.V. max of 7.3 on the prior. The left periaortic retroperitoneal node measures 10 mm and a S.U.V. max of 4.5 on image 130/series 4. Compare 11 mm and a S.U.V. max of 6.5 on the prior PET. A portal caval node measures 9 mm and a S.U.V. max of 3.8 on image 126/series 4. Compare 12 mm and a S.U.V. max of 7.7 on the prior PET. SKELETON Hypermetabolic focus within the right posterior portion of the T11 vertebral body measures a S.U.V. max of 11.5 today versus a S.U.V. max of 7.8 on the prior. Low-level hypermetabolism about the  right inferior pubic ramus measures a S.U.V. max of 6.8 today versus a S.U.V. max of 3.8 previously. CT IMAGES PERFORMED FOR ATTENUATION CORRECTION Lytic lesions in the skull, including a left parietal 19 mm lesion, similar. No cervical adenopathy. Right axillary node dissection. Lad coronary artery atherosclerosis, including on image 92/series 4. 6 mm nodule in the right upper or right middle lobe is similar including on image 85/series 4. Radiation fibrosis in the left apex. Rounded atelectasis in the left lower lobe. Circumaortic left renal vein. Abdominal aortic atherosclerosis. Mild pelvic floor laxity. IMPRESSION: 1. Mild metabolic response to therapy within the chest. 2. Mild metabolic response to therapy within the liver and abdominal nodal stations. 3. Increased hypermetabolism corresponding to osseous metastasis, including a dominant right-sided T11 lesion. 4. No new sites of disease. 5.  Coronary artery atherosclerosis. Aortic atherosclerosis. Electronically Signed   By: Abigail Miyamoto M.D.   On: 11/26/2015 15:15    Impression: Stage IV breast cancer  We reviewed the patient's most recent X-rays, in particular her PET scan. Her most painful area at this time would be her right ischial tuberosity area. I would recommend an MRI of the pelvis to further evaluate this area. The patient is also inquiring about radiofrequency ablation of these areas and will discuss with interventional radiology to further explore this type of therapy for her osseous metastases.  Plan: I will meet with the patient after he pelvic MRI for further evaluation.  ____________________________________ -----------------------------------  Blair Promise, PhD, MD  This document serves as a record of services personally performed by Gery Pray, MD. It was created on his behalf by Darcus Austin, a trained medical scribe. The creation of this record is based on the scribe's personal observations and the provider's statements  to them. This document has been checked and approved by the attending provider.

## 2015-12-05 NOTE — Progress Notes (Signed)
Please see the Nurse Progress Note in the MD Initial Consult Encounter for this patient. 

## 2015-12-06 ENCOUNTER — Telehealth: Payer: Self-pay | Admitting: Oncology

## 2015-12-06 ENCOUNTER — Telehealth: Payer: Self-pay

## 2015-12-06 NOTE — Telephone Encounter (Signed)
Pt called concerned about info received from rad-onc yesterday. Wants to talk with Dr Jana Hakim via phone or office visit.   S/w Dr Jana Hakim and he will call her later today. Let pt know

## 2015-12-06 NOTE — Telephone Encounter (Signed)
Called and left a message for Ubaldo Glassing, RN in Infusion to set up IV start for 12/12/15 MRI.  Requested a return call.

## 2015-12-06 NOTE — Telephone Encounter (Signed)
[name] [name] left a message saying that she wants to notify Dr. Sondra Come that the pain in her left rib cage increase yesterday afternoon.  She said she is waking up during the night from the pain.  She needs the MRI done stat.  She wants to have treatment arranged as soon as possible for her T11 and left rib cage area.  She said she wants a consult arranged with Dr. Lisbeth Renshaw as soon as possible if SRS will be needed.  She "does not want to spend weeks waiting on this."

## 2015-12-06 NOTE — Telephone Encounter (Signed)
Called Sharma back.  She wants to make sure that Dr. Sondra Come is talking to radiology and that her treatment plan is being discussed.  She does not want to wait 2 weeks to start treatment.  She also requested that Ubaldo Glassing, RN in infusion start her IV before her MRI on 9/13.  Advised her that I will arrange her IV start and that we will call her back about the next steps for her treatment.

## 2015-12-07 ENCOUNTER — Other Ambulatory Visit: Payer: Self-pay | Admitting: Oncology

## 2015-12-07 ENCOUNTER — Telehealth: Payer: Self-pay

## 2015-12-07 ENCOUNTER — Ambulatory Visit: Payer: 59 | Admitting: Physical Therapy

## 2015-12-07 ENCOUNTER — Telehealth: Payer: Self-pay | Admitting: *Deleted

## 2015-12-07 DIAGNOSIS — M79651 Pain in right thigh: Secondary | ICD-10-CM | POA: Diagnosis not present

## 2015-12-07 DIAGNOSIS — C50211 Malignant neoplasm of upper-inner quadrant of right female breast: Secondary | ICD-10-CM

## 2015-12-07 DIAGNOSIS — C7951 Secondary malignant neoplasm of bone: Secondary | ICD-10-CM

## 2015-12-07 DIAGNOSIS — C771 Secondary and unspecified malignant neoplasm of intrathoracic lymph nodes: Secondary | ICD-10-CM

## 2015-12-07 DIAGNOSIS — I890 Lymphedema, not elsewhere classified: Secondary | ICD-10-CM

## 2015-12-07 MED ORDER — GABAPENTIN 300 MG PO CAPS: 300.0000 mg | ORAL_CAPSULE | Freq: Every day | ORAL | 4 refills | Status: DC

## 2015-12-07 NOTE — Therapy (Signed)
[place], [place], [address] Phone: [phone]   Fax:  [phone]  Physical Therapy Treatment  Patient Details  Name: [name] [name] MRN: [id number] Date of Birth: January 30, 1961 Referring Provider: Dr. Lurline Del  Encounter Date: 12/07/2015      PT End of Session - 12/07/15 1236    Visit Number [id number] for lymph   Number of Visits 72  for lymph   Date for PT Re-Evaluation 12/10/15   Authorization - Visit Number 140   PT Start Time [id number]   PT Stop Time 1106   PT Time Calculation (min) 48 min   Activity Tolerance Patient tolerated treatment well   Behavior During Therapy [facility] for tasks assessed/performed      Past Medical History:  Diagnosis Date  . Bone metastases ([place]) dx'd 05/2014  . Breast cancer ([place]) dx'd 2005/2011  . Peripheral vascular disease ([place]) 02/2010   blood clot related to porta cath  . PONV (postoperative nausea and vomiting)   . S/P radiation therapy 07/17/2014 through 08/02/2014    Left mediastinum, left seventh rib 3250 cGy in 13 sessions   . S/P radiation therapy 12/11/2014 through 12/22/2014    Left parietal calvarium 2400 cGy in 8 sessions   . Seizures ([place]) 2010   Isolated incident.    Past Surgical History:  Procedure Laterality Date  . AXILLARY LYMPH NODE DISSECTION  Dec. 2011  . BREAST LUMPECTOMY  2005  . MEDIASTINOTOMY CHAMBERLAIN MCNEIL Left 06/02/2013   Procedure: MEDIASTINOTOMY CHAMBERLAIN MCNEIL;  Surgeon: [name], MD;  Location: [facility];  Service: Thoracic;  Laterality: Left;  LEFT ANTERIOR MEDIASTINOTOMY   . PORTACATH PLACEMENT  12/11  . removal portacath      There were no vitals filed for this visit.      Subjective Assessment - 12/07/15 1022    Subjective Spent a lot of time  sitting yesterday because of pain.   Currently in Pain? Yes   Pain Score 6    Pain Location Axilla   Pain Orientation Right   Pain Descriptors / Indicators Other (Comment)  fullness   Pain Score 4   Pain Location Groin   Pain Orientation Right   Pain Descriptors / Indicators Burning;Constant                         [facility] Adult PT Treatment/Exercise - 12/07/15 0001      Manual Therapy   Myofascial Release right axilla with focus on scar tightness   Manual Lymphatic Drainage (MLD) In left sidelying, posterior interaxillary anastomosis and right axillo-inguinal anastomosis; in supine, short neck, left axilla and anterior interaxillary anastomosis, right groin and axillo-inguinal anastomosis, and right upper extremity from fingers to shoulder.  In right sidelying, left periscapular area toward left groin.   Other Manual Therapy soft tissue work at superior and entire aspect of right medial thigh in right sidelying and toward prone position for pain relief and muscle relaxation, gentle myofascial release at upper thigh (longer today than other recent visits)                  PT Short Term Goals - 02/27/15 1011      PT SHORT TERM GOAL #1   Title pain with walking decreased >/= 25%   Time 3   Period Weeks   Status Achieved     PT SHORT TERM GOAL #2   Title pelvis stays in correct  alignment for 3 weeks   Time 3   Period Weeks   Status Achieved           PT Long Term Goals - 08/14/15 1317      PT LONG TERM GOAL #1   Title indpendent with HEP   Time 12   Period Weeks   Status On-going     PT LONG TERM GOAL #2   Title pain with walking decreased >/= 80%   Time 12   Period Weeks   Status On-going     PT LONG TERM GOAL #3   Title go up and down steps with a step over step pattern   Time 6   Status Deferred     PT LONG TERM GOAL #4   Title being able to last for 5 days without therapy and pain not going above a 8/10 due to decreased muscle  soreness   Time 12   Period Weeks   Status Achieved           Long Term Clinic Goals - 12/07/15 1238      CC Long Term Goal  #2   Title Pt. will report swelling is adequately managed to enable ADL function at a consistent level.   Status On-going     CC Long Term Goal  #4   Title Pain/discomfort at right axilla area will be controlled at 6/10 or less.   Status On-going     CC Long Term Goal  #5   Title Patient will avoid infection by ongoing management of her lymphedema at right breast/axilla/upper arm areas.   Status On-going            Plan - 12/07/15 1236    Clinical Impression Statement Pain has been increasing in some areas and patient is pursuing the possibility of palliative radiation for one or more of these.  Continuing to benefit from therapy for both pain and swelling.   Rehab Potential Good   Clinical Impairments Affecting Rehab Potential active cancer   PT Frequency 2x / week   PT Duration 12 weeks   PT Treatment/Interventions Manual techniques;Manual lymph drainage   PT Next Visit Plan Continue manual lymph drainage for right upper outer breast and for right UE; also for left rib pain; continue right thigh soft tissue work focused on medial right thigh with myofascial release   PT Plan Continue manual lymph drainage, soft tissue work.      Patient will benefit from skilled therapeutic intervention in order to improve the following deficits and impairments:  Pain, Increased muscle spasms, Difficulty walking, Increased edema  Visit Diagnosis: Lymphedema, not elsewhere classified  Pain in right thigh     Problem List Patient Active Problem List   Diagnosis Date Noted  . 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 Zoster 04/04/2015  . Nausea with vomiting 11/18/2014  . Constipation 11/18/2014  . Left-sided thoracic back pain   . Bone metastases ([place]) 11/16/2014  . Back pain 11/15/2014  . Uncontrolled pain 11/14/2014  . Post-lymphadenectomy  lymphedema of arm 05/31/2014  . Chest wall pain 03/21/2014  . Abnormal LFTs (liver function tests) 09/12/2013  . Breast cancer of upper-inner quadrant of right female breast ([place]) 08/18/2013  . 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

## 2015-12-07 NOTE — Telephone Encounter (Signed)
Pt called requesting gabapentin Rx be sent to [facility] instead of [facility]. done

## 2015-12-07 NOTE — Telephone Encounter (Signed)
CALLED PATIENT TO INFORM OF MRI ON 12-12-15 AND HER VISIT WITH DR. KINARD ON 12-13-15, SPOKE WITH PATIENT AND SHE IS AWARE OF THESE APPTS.

## 2015-12-07 NOTE — Progress Notes (Signed)
Discussed [name] [name]'s pain extensively. She is working w dr Sondra Come and Dr Kathrynn Humble trying to pinpoint the area in question for possible SRS therapy or cryoablation. She is set up for a pelvic MRI  9/13 and f/u w Dr Sondra Come 9/14.  I wrote for gabapentin for her to take at hs as needed. She is concerned it may constipating. She will let me know of any side effects.

## 2015-12-10 ENCOUNTER — Other Ambulatory Visit (HOSPITAL_BASED_OUTPATIENT_CLINIC_OR_DEPARTMENT_OTHER): Payer: 59

## 2015-12-10 ENCOUNTER — Ambulatory Visit: Payer: 59 | Admitting: Radiation Oncology

## 2015-12-10 ENCOUNTER — Ambulatory Visit: Payer: 59 | Admitting: Physical Therapy

## 2015-12-10 DIAGNOSIS — C771 Secondary and unspecified malignant neoplasm of intrathoracic lymph nodes: Secondary | ICD-10-CM | POA: Diagnosis not present

## 2015-12-10 DIAGNOSIS — I890 Lymphedema, not elsewhere classified: Secondary | ICD-10-CM

## 2015-12-10 DIAGNOSIS — M79651 Pain in right thigh: Secondary | ICD-10-CM

## 2015-12-10 DIAGNOSIS — C50211 Malignant neoplasm of upper-inner quadrant of right female breast: Secondary | ICD-10-CM

## 2015-12-10 DIAGNOSIS — J90 Pleural effusion, not elsewhere classified: Secondary | ICD-10-CM

## 2015-12-10 LAB — CBC WITH DIFFERENTIAL/PLATELET
BASO%: 1.8 % (ref 0.0–2.0)
Basophils Absolute: 0.1 10*3/uL (ref 0.0–0.1)
EOS%: 2.4 % (ref 0.0–7.0)
Eosinophils Absolute: 0.1 10*3/uL (ref 0.0–0.5)
HEMATOCRIT: 34.6 % — AB (ref 34.8–46.6)
HEMOGLOBIN: 12.3 g/dL (ref 11.6–15.9)
LYMPH#: 0.8 10*3/uL — AB (ref 0.9–3.3)
LYMPH%: 23.5 % (ref 14.0–49.7)
MCH: 36.7 pg — AB (ref 25.1–34.0)
MCHC: 35.5 g/dL (ref 31.5–36.0)
MCV: 103.3 fL — ABNORMAL HIGH (ref 79.5–101.0)
MONO#: 0.3 10*3/uL (ref 0.1–0.9)
MONO%: 10 % (ref 0.0–14.0)
NEUT%: 62.3 % (ref 38.4–76.8)
NEUTROS ABS: 2.1 10*3/uL (ref 1.5–6.5)
NRBC: 0 % (ref 0–0)
Platelets: 308 10*3/uL (ref 145–400)
RBC: 3.35 10*6/uL — ABNORMAL LOW (ref 3.70–5.45)
RDW: 13.7 % (ref 11.2–14.5)
WBC: 3.4 10*3/uL — ABNORMAL LOW (ref 3.9–10.3)

## 2015-12-10 NOTE — Therapy (Signed)
[place], [place], [address] Phone: [phone]   Fax:  [phone]  Physical Therapy Treatment  Patient Details  Name: [name] [name] MRN: [id number] Date of Birth: October 14, 1960 Referring Provider: Dr. Lurline Del  Encounter Date: 12/10/2015      PT End of Session - 12/10/15 1229    Visit Number [id number] for lymph   Number of Visits 95  for lymph   Date for PT Re-Evaluation 03/03/16   Authorization - Visit Number 140   PT Start Time 1020   PT Stop Time 1105   PT Time Calculation (min) 45 min   Activity Tolerance Patient tolerated treatment well   Behavior During Therapy [facility] for tasks assessed/performed      Past Medical History:  Diagnosis Date  . Bone metastases ([place]) dx'd 05/2014  . Breast cancer ([place]) dx'd 2005/2011  . Peripheral vascular disease ([place]) 02/2010   blood clot related to porta cath  . PONV (postoperative nausea and vomiting)   . S/P radiation therapy 07/17/2014 through 08/02/2014    Left mediastinum, left seventh rib 3250 cGy in 13 sessions   . S/P radiation therapy 12/11/2014 through 12/22/2014    Left parietal calvarium 2400 cGy in 8 sessions   . Seizures ([place]) 2010   Isolated incident.    Past Surgical History:  Procedure Laterality Date  . AXILLARY LYMPH NODE DISSECTION  Dec. 2011  . BREAST LUMPECTOMY  2005  . MEDIASTINOTOMY CHAMBERLAIN MCNEIL Left 06/02/2013   Procedure: MEDIASTINOTOMY CHAMBERLAIN MCNEIL;  Surgeon: [name], MD;  Location: [place];  Service: Thoracic;  Laterality: Left;  LEFT ANTERIOR MEDIASTINOTOMY   . PORTACATH PLACEMENT  12/11  . removal portacath      There were no vitals filed for this visit.      Subjective Assessment - 12/10/15 1024    Subjective "This is trying to seize  up back "(posterior to right axilla). Walked a mile on Saturday and it's talking to me.   Currently in Pain? Yes   Pain Score 6    Pain Location Axilla   Pain Orientation Right   Pain Score 4   Pain Location Groin   Pain Orientation Right   Aggravating Factors  walking more   Pain Relieving Factors therapy (myofascial pulling)                         [facility] Adult PT Treatment/Exercise - 12/10/15 0001      Manual Therapy   Myofascial Release right axilla with focus on scar tightness   Manual Lymphatic Drainage (MLD) In left sidelying, posterior interaxillary anastomosis and right axillo-inguinal anastomosis; in supine, short neck, left axilla and anterior interaxillary anastomosis, right groin and axillo-inguinal anastomosis, and right upper extremity from fingers to shoulder.  In right sidelying, left periscapular area toward left groin.   Other Manual Therapy soft tissue work at superior and entire aspect of right medial thigh in right sidelying and toward prone position for pain relief and muscle relaxation, gentle myofascial release at upper thigh (longer today than other recent visits)                PT Education - 12/10/15 1228    Education provided Yes   Education Details try rotating at hip in and out to see if a certain position of leg will improve stretch to area of pain at right ischial tuberosity   Person(s) Educated Patient  Methods Explanation;Demonstration   Comprehension Verbalized understanding          PT Short Term Goals - 02/27/15 1011      PT SHORT TERM GOAL #1   Title pain with walking decreased >/= 25%   Time 3   Period Weeks   Status Achieved     PT SHORT TERM GOAL #2   Title pelvis stays in correct alignment for 3 weeks   Time 3   Period Weeks   Status Achieved           PT Long Term Goals - 08/14/15 1317      PT LONG TERM GOAL #1   Title indpendent with HEP   Time 12   Period Weeks   Status On-going     PT  LONG TERM GOAL #2   Title pain with walking decreased >/= 80%   Time 12   Period Weeks   Status On-going     PT LONG TERM GOAL #3   Title go up and down steps with a step over step pattern   Time 6   Status Deferred     PT LONG TERM GOAL #4   Title being able to last for 5 days without therapy and pain not going above a 8/10 due to decreased muscle soreness   Time 12   Period Weeks   Status Achieved           Long Term Clinic Goals - 12/10/15 1234      CC Long Term Goal  #1   Title Reports left upper back and left side pain is controlled at 4/10 or less with therapy at the current frequency.   Status Achieved     CC Long Term Goal  #2   Title Pt. will report swelling is adequately managed to enable ADL function at a consistent level.   Status On-going     CC Long Term Goal  #4   Title Pain/discomfort at right axilla area will be controlled at 6/10 or less.   Status On-going     CC Long Term Goal  #5   Title Patient will avoid infection by ongoing management of her lymphedema at right breast/axilla/upper arm areas.   Status On-going     CC Long Term Goal  #6   Title Pain in right groin/ischial tuberosity area will be controlled at 3/10 level or less to enable walking with less pain.   Time 12   Period Weeks   Status New     Additional Goals   Additional Goals Yes            Plan - 12/10/15 1231    Clinical Impression Statement Patient notes signficant improvement in right ischial tuberosity area pain following myofascial release to this area.   Rehab Potential Good   Clinical Impairments Affecting Rehab Potential active cancer   PT Frequency 2x / week   PT Duration 12 weeks   PT Treatment/Interventions Manual techniques;Manual lymph drainage   PT Next Visit Plan Continue manual lymph drainage for right upper outer breast and for right UE; also for left rib pain; continue right thigh soft tissue work focused on medial right thigh with myofascial release    PT Home Exercise Plan try changing rotation of right hip during hamstring stretches   Consulted and Agree with Plan of Care Patient   PT Plan Continue manual lymph drainage, soft tissue work, myofascial release.      Patient will benefit from  skilled therapeutic intervention in order to improve the following deficits and impairments:  Pain, Increased muscle spasms, Difficulty walking, Increased edema  Visit Diagnosis: Lymphedema, not elsewhere classified - Plan: PT plan of care cert/re-cert  Pain in right thigh - Plan: PT plan of care cert/re-cert     Problem List Patient Active Problem List   Diagnosis Date Noted  . 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 Zoster 04/04/2015  . Nausea with vomiting 11/18/2014  . Constipation 11/18/2014  . Left-sided thoracic back pain   . Bone metastases ([place]) 11/16/2014  . Back pain 11/15/2014  . Uncontrolled pain 11/14/2014  . 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 Chest wall pain 03/21/2014  . Abnormal LFTs (liver function tests) 09/12/2013  . Breast cancer of upper-inner quadrant of right female breast ([place]) 08/18/2013  . 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 [place], [place], [address] Phone: [phone]   Fax:  [phone]  Name: [name] [name] MRN: [id number] Date of Birth: 01-Jun-1960   [name], PT 12/10/15 12:38 PM

## 2015-12-11 ENCOUNTER — Telehealth: Payer: Self-pay

## 2015-12-11 ENCOUNTER — Ambulatory Visit: Payer: 59 | Admitting: Radiation Oncology

## 2015-12-11 NOTE — Telephone Encounter (Addendum)
Per Lenise the medication has to go through [facility] and get the denial before we can proceed with request for finanacial assistance through [place]. According to Raquel's last note a form was placed for Dr Jana Hakim to sign.  Lenise does not see any paperwork in financial advocate office pertaining to this. She is asking if there is paperwork from [facility] that is at the [employer].   Dr Jana Hakim does remember signing papers for [facility] financial assistance.

## 2015-12-11 NOTE — Telephone Encounter (Signed)
Pt called stating she and [facility] had filled out forms for manufacturer support for her Keytruda. She received a letter from [facility] denying coverage because it wasn't a standard. Was it submitted to the wrong entity? What is the process here? Where does the submittal to the manufacturer support stand? Why was it submitted to [facility]? Who is handling this at [facility]/ who will she contact with questions.   [name] [name]'s last note states: Faxed [employer] form for [place]- they sent form for dr. Jana Hakim for off label use. I left for dr. Jana Hakim to sign.  S/w pt we received her message.

## 2015-12-12 ENCOUNTER — Ambulatory Visit: Payer: 59

## 2015-12-12 ENCOUNTER — Ambulatory Visit: Payer: 59 | Admitting: Radiation Oncology

## 2015-12-12 ENCOUNTER — Ambulatory Visit: Payer: Self-pay | Admitting: Radiation Oncology

## 2015-12-12 ENCOUNTER — Ambulatory Visit (HOSPITAL_COMMUNITY)
Admission: RE | Admit: 2015-12-12 | Discharge: 2015-12-12 | Disposition: A | Discharge status: Home / Self Care | Payer: 59 | Source: Ambulatory Visit | Attending: Radiation Oncology | Admitting: Radiation Oncology

## 2015-12-12 ENCOUNTER — Encounter: Payer: 59 | Admitting: Radiation Oncology

## 2015-12-12 DIAGNOSIS — C7951 Secondary malignant neoplasm of bone: Secondary | ICD-10-CM

## 2015-12-12 MED ORDER — GADOBENATE DIMEGLUMINE 529 MG/ML IV SOLN
15.0000 mL | Freq: Once | INTRAVENOUS | Status: AC | PRN
  Administered 2015-12-12: 15 mL via INTRAVENOUS

## 2015-12-12 NOTE — Telephone Encounter (Signed)
Papers have been faxed to [employer] for assistance program. Pt is aware.

## 2015-12-12 NOTE — Progress Notes (Signed)
Pt refused vital signs to be taken.

## 2015-12-12 NOTE — Progress Notes (Signed)
Left wrist PIV, 22g placed for MRI. Patient wheeled to MRI via wheelchair by Chelle NT.

## 2015-12-13 ENCOUNTER — Ambulatory Visit
Admission: RE | Admit: 2015-12-13 | Discharge: 2015-12-13 | Disposition: A | Discharge status: Home / Self Care | Payer: 59 | Source: Ambulatory Visit | Attending: Radiation Oncology | Admitting: Radiation Oncology

## 2015-12-13 ENCOUNTER — Ambulatory Visit: Payer: 59 | Admitting: Radiation Oncology

## 2015-12-13 ENCOUNTER — Ambulatory Visit: Payer: 59

## 2015-12-13 ENCOUNTER — Ambulatory Visit: Admission: RE | Admit: 2015-12-13 | Payer: 59 | Source: Ambulatory Visit | Admitting: Radiation Oncology

## 2015-12-13 ENCOUNTER — Ambulatory Visit: Payer: Self-pay | Admitting: Radiation Oncology

## 2015-12-13 DIAGNOSIS — C7951 Secondary malignant neoplasm of bone: Secondary | ICD-10-CM

## 2015-12-13 DIAGNOSIS — Z510 Encounter for antineoplastic radiation therapy: Secondary | ICD-10-CM | POA: Diagnosis not present

## 2015-12-13 NOTE — Progress Notes (Signed)
Mrs Carroll Ranney is here for a re-consult for metastaticbone disease involving the pelvis with associated nondisplaced pathologic fracture in the right inferior pubic ramus from MRI   Pain having a lot. Would not discuss, Right skull,met, left rib cage, pelvis, coccyx. Gait ambulating with difficulty.  Sitting mostly. Refused to get a weight or have vital signs taken today

## 2015-12-14 ENCOUNTER — Ambulatory Visit
Admission: RE | Admit: 2015-12-14 | Discharge: 2015-12-14 | Disposition: A | Discharge status: Home / Self Care | Payer: 59 | Source: Ambulatory Visit | Attending: Radiation Oncology | Admitting: Radiation Oncology

## 2015-12-14 ENCOUNTER — Ambulatory Visit: Payer: 59 | Admitting: Physical Therapy

## 2015-12-14 ENCOUNTER — Encounter: Payer: Self-pay | Admitting: Oncology

## 2015-12-14 DIAGNOSIS — C7951 Secondary malignant neoplasm of bone: Secondary | ICD-10-CM

## 2015-12-14 DIAGNOSIS — Z510 Encounter for antineoplastic radiation therapy: Secondary | ICD-10-CM | POA: Diagnosis not present

## 2015-12-14 DIAGNOSIS — M79651 Pain in right thigh: Secondary | ICD-10-CM

## 2015-12-14 DIAGNOSIS — I890 Lymphedema, not elsewhere classified: Secondary | ICD-10-CM

## 2015-12-14 NOTE — Therapy (Signed)
[place], [place], [address] Phone: [phone]   Fax:  [phone]  Physical Therapy Treatment  Patient Details  Name: [name] [name] MRN: [id number] Date of Birth: [birth year] Referring Provider: Dr. Lurline Del  Encounter Date: 12/14/2015      PT End of Session - 12/14/15 1235    Visit Number [id number] for lymph   Number of Visits 95  for lymph   Date for PT Re-Evaluation 03/03/16   Authorization - Visit Number 140   PT Start Time 1019   PT Stop Time 1110   PT Time Calculation (min) 51 min   Activity Tolerance Patient tolerated treatment well   Behavior During Therapy [facility] for tasks assessed/performed      Past Medical History:  Diagnosis Date  . Bone metastases ([place]) dx'd 05/2014  . Breast cancer ([place]) dx'd 2005/2011  . Peripheral vascular disease ([place]) 02/2010   blood clot related to porta cath  . PONV (postoperative nausea and vomiting)   . S/P radiation therapy 07/17/2014 through 08/02/2014    Left mediastinum, left seventh rib 3250 cGy in 13 sessions   . S/P radiation therapy 12/11/2014 through 12/22/2014    Left parietal calvarium 2400 cGy in 8 sessions   . Seizures ([place]) 2010   Isolated incident.    Past Surgical History:  Procedure Laterality Date  . AXILLARY LYMPH NODE DISSECTION  Dec. 2011  . BREAST LUMPECTOMY  2005  . MEDIASTINOTOMY CHAMBERLAIN MCNEIL Left 06/02/2013   Procedure: MEDIASTINOTOMY CHAMBERLAIN MCNEIL;  Surgeon: [name], MD;  Location: [place];  Service: Thoracic;  Laterality: Left;  LEFT ANTERIOR MEDIASTINOTOMY   . PORTACATH PLACEMENT  12/11  . removal portacath      There were no vitals filed for this visit.      Subjective Assessment - 12/14/15 1020    Subjective MRI showed a hairline  fracture still at the inferior pubis ramus; left 9th rib also showed something.   Currently in Pain? Yes   Pain Score 6    Pain Location Axilla   Pain Orientation Right   Pain Descriptors / Indicators --  fullness   Pain Type Chronic pain   Aggravating Factors  increased swelling   Pain Relieving Factors therapy   Pain Score 3   Pain Location Groin   Pain Orientation Right   Pain Descriptors / Indicators Burning   Pain Type Chronic pain   Aggravating Factors  walking more   Pain Relieving Factors therapy, especially myofascial pulling            [facility] PT Assessment - 12/14/15 0001      Observation/Other Assessments   Skin Integrity approx. 1 cm. circular abrasion at posterior right elbow                     [facility] Adult PT Treatment/Exercise - 12/14/15 0001      Manual Therapy   Myofascial Release right axilla with focus on scar tightness   Manual Lymphatic Drainage (MLD) In left sidelying, posterior interaxillary anastomosis and right axillo-inguinal anastomosis; in supine, short neck, left axilla and anterior interaxillary anastomosis, right groin and axillo-inguinal anastomosis, and right upper extremity from fingers to shoulder.  In right sidelying, left periscapular area toward left groin.   Other Manual Therapy soft tissue work at superior and entire aspect of right medial thigh in right sidelying and toward prone position for pain relief and muscle relaxation, gentle myofascial release  at upper thigh (longer today than other recent visits)                  PT Short Term Goals - 02/27/15 1011      PT SHORT TERM GOAL #1   Title pain with walking decreased >/= 25%   Time 3   Period Weeks   Status Achieved     PT SHORT TERM GOAL #2   Title pelvis stays in correct alignment for 3 weeks   Time 3   Period Weeks   Status Achieved           PT Long Term Goals - 08/14/15 1317      PT LONG TERM GOAL #1   Title indpendent with HEP   Time  12   Period Weeks   Status On-going     PT LONG TERM GOAL #2   Title pain with walking decreased >/= 80%   Time 12   Period Weeks   Status On-going     PT LONG TERM GOAL #3   Title go up and down steps with a step over step pattern   Time 6   Status Deferred     PT LONG TERM GOAL #4   Title being able to last for 5 days without therapy and pain not going above a 8/10 due to decreased muscle soreness   Time 12   Period Weeks   Status Achieved           Long Term Clinic Goals - 12/10/15 1234      CC Long Term Goal  #1   Title Reports left upper back and left side pain is controlled at 4/10 or less with therapy at the current frequency.   Status Achieved     CC Long Term Goal  #2   Title Pt. will report swelling is adequately managed to enable ADL function at a consistent level.   Status On-going     CC Long Term Goal  #4   Title Pain/discomfort at right axilla area will be controlled at 6/10 or less.   Status On-going     CC Long Term Goal  #5   Title Patient will avoid infection by ongoing management of her lymphedema at right breast/axilla/upper arm areas.   Status On-going     CC Long Term Goal  #6   Title Pain in right groin/ischial tuberosity area will be controlled at 3/10 level or less to enable walking with less pain.   Time 12   Period Weeks   Status New     Additional Goals   Additional Goals Yes            Plan - 12/14/15 1236    Clinical Impression Statement Continues to benefit from both manual lymph drainage for right upper outer breast area swelling and discomfort, and from soft tissue work including myofascial release for right hamstring/hip adductor areas.   Rehab Potential Good   Clinical Impairments Affecting Rehab Potential active cancer   PT Frequency 2x / week   PT Duration 12 weeks   PT Treatment/Interventions Manual techniques;Manual lymph drainage   PT Next Visit Plan Continue manual lymph drainage for right upper outer breast  and for right UE; also for left rib pain; continue right thigh soft tissue work focused on medial right thigh with myofascial release   Consulted and Agree with Plan of Care Patient   PT Plan Continue manual lymph drainage, soft tissue work, myofascial release.  Patient will benefit from skilled therapeutic intervention in order to improve the following deficits and impairments:  Pain, Increased muscle spasms, Difficulty walking, Increased edema  Visit Diagnosis: Lymphedema, not elsewhere classified  Pain in right thigh     Problem List Patient Active Problem List   Diagnosis Date Noted  . 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 Zoster 04/04/2015  . Nausea with vomiting 11/18/2014  . Constipation 11/18/2014  . Left-sided thoracic back pain   . Bone metastases ([place]) 11/16/2014  . Back pain 11/15/2014  . Uncontrolled pain 11/14/2014  . 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 Chest wall pain 03/21/2014  . Abnormal LFTs (liver function tests) 09/12/2013  . Breast cancer of upper-inner quadrant of right female breast ([place]) 08/18/2013  . 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

## 2015-12-14 NOTE — Progress Notes (Signed)
Faxed signed [employer] form for [employer] for off label use.

## 2015-12-14 NOTE — Progress Notes (Signed)
Radiation Oncology         [phone] ________________________________  Name: [name] [name] MRN: [id number]  Date: 12/13/2015  DOB: Nov 15, 1960  Follow-Up Visit Note  CC: Pcp Not In System  Magrinat, Virgie Dad, MD  Diagnosis:      ICD-9-CM ICD-10-CM   1. Bone metastases (HCC) 198.5 C79.51      Narrative:  The patient returns today for Reevaluation. The patient is very familiar with our clinic with initial radiation treatment to the right breast which has been followed by subsequent radiation treatments in our clinic. She originally was treated with Dr. Valere Dross and also has received a palliative course of radiation treatment to the right pelvis with Dr. Tammi Klippel as well. She has undergone several rounds of palliative radiation treatment for bony metastases as well as some left pleural disease. Overall, the patient has done very well with this history of treatment. I did discuss her case with medical oncology today who also feels that she has done well and has had a slow growing tumor which has been very fortunate.  Today, she complains of some significant pain in the right pelvis. This feels somewhat different to her that her prior right pelvic pain for which she was treated with Dr. Tammi Klippel. Some radiation of pain as well as some tightness. She also is having some significant discomfort in the left lateral inferior chest. She has reviewed her PET scan with medical oncology and felt that an area of uptake which was increased on the PET scan did correspond to the spot of discomfort. This is an area which has been treated before with palliative radiation treatment with Dr. Valere Dross. Of note, the patient also had increased SUV activity in the T 11 vertebral body in a fairly small area of focus which was right sided. This again has been treated before with radiation treatment but is not causing major symptoms currently. The patient certainly is interested in following this area closely as she had  severe discomfort which arose quite quickly last year and did prompt a hospital stay. We would certainly like to avoid a repeat of this going forward.                         ALLERGIES:  is allergic to 2nd skin quick heal; decadron [dexamethasone]; dilaudid [hydromorphone]; enoxaparin; fluconazole; hydromorphone hcl; morphine and related; protonix [pantoprazole sodium]; and tegaderm ag mesh [silver].  Meds: Current Outpatient Prescriptions  Medication Sig Dispense Refill  . ALPRAZolam (XANAX) 0.5 MG tablet Take 1 tablet (0.5 mg total) by mouth 2 (two) times daily as needed for anxiety. 30 tablet 0  . B Complex-C (B-COMPLEX WITH VITAMIN C) tablet Take 1 tablet by mouth daily. Reported on 03/27/2015    . calcium carbonate (TUMS - DOSED IN MG ELEMENTAL CALCIUM) 500 MG chewable tablet Chew 1 tablet by mouth as directed.    . cholecalciferol 2000 UNITS tablet Take 1 tablet (2,000 Units total) by mouth daily.    . Diphenhyd-Hydrocort-Nystatin (FIRST-DUKES MOUTHWASH) SUSP 5-10 ml qid SWISH AND SPIT [id number] mL 3  . folic acid (FOLVITE) 1 MG tablet Take 1 tablet (1 mg total) by mouth daily.    Marland Kitchen gabapentin (NEURONTIN) 300 MG capsule Take 1 capsule (300 mg total) by mouth at bedtime. 90 capsule 4  . loratadine-pseudoephedrine (CLARITIN-D 24-HOUR) 10-240 MG 24 hr tablet Take 1 tablet by mouth daily.    . Melatonin 3 MG TABS Take 3 mg by mouth at bedtime.    Marland Kitchen  naproxen sodium (ANAPROX) 220 MG tablet Take 220 mg by mouth 2 (two) times daily with a meal.    . palbociclib (IBRANCE) 75 MG capsule Take 1 capsule (75 mg total) by mouth daily with breakfast. Take whole with food. 21 capsule 6  . RABEprazole Sodium 5 MG CPSP Take 5 mg by mouth daily. 30 capsule 3  . saccharomyces boulardii (FLORASTOR) 250 MG capsule Take 250 mg by mouth daily.      No current facility-administered medications for this encounter.     Physical Findings: The patient is in no acute distress. Patient is alert and oriented.  Lab  Findings: Lab Results  Component Value Date   WBC 3.4 (L) 12/10/2015   HGB 12.3 12/10/2015   HCT 34.6 (L) 12/10/2015   MCV 103.3 (H) 12/10/2015   PLT 308 12/10/2015     Radiographic Findings: Mr Pelvis W Wo Contrast  Result Date: 12/13/2015 CLINICAL DATA:  Metastatic breast cancer with pubic pain. EXAM: MRI PELVIS WITHOUT AND WITH CONTRAST TECHNIQUE: Multiplanar multisequence MR imaging of the pelvis was performed both before and after administration of intravenous contrast. CONTRAST:  64mL MULTIHANCE GADOBENATE DIMEGLUMINE 529 MG/ML IV SOLN COMPARISON:  Radiographs 11/29/2015 and head CT 11/26/2015 FINDINGS: There are multiple enhancing metastatic bone lesions involving the pelvis. These involve the seat chrome, the iliac bones and the right ischium. There are also small lesions involving the pubic bones on the right side and a large lesion involving the right inferior pubic ramus. This is associated with a pathologic fracture with surrounding edema in the right operator muscles. This is likely the source of the patient's pelvic pain. No hip lesions or hip fracture. No AVN. No significant intrapelvic abnormalities are demonstrated. No intrapelvic mass or adenopathy. No inguinal mass or adenopathy. No muscle metastasis. IMPRESSION: Numerous enhancing metastatic bone lesions involving the pelvis. The most significant involvement is in the inferior pubic ramus on the right side. There is a nondisplaced pathologic fracture likely accounting for the patient's pain. There is associated inflammation/ edema in the right obturator muscles. No significant intrapelvic abnormalities. Electronically Signed   By: Marijo Sanes M.D.   On: 12/13/2015 08:12   Mr Liver W [id number] Contrast  Result Date: 11/23/2015 CLINICAL DATA:  Breast cancer. Mediastinal and abdominal nodal metastasis. Bone metastasis. EXAM: MRI ABDOMEN WITHOUT AND WITH CONTRAST TECHNIQUE: Multiplanar multisequence MR imaging of the abdomen was  performed both before and after the administration of intravenous contrast. CONTRAST:  56mL MULTIHANCE GADOBENATE DIMEGLUMINE 529 MG/ML IV SOLN COMPARISON:  PET of 09/05/2015.  Abdominal MRI of 08/06/2015. FINDINGS: Portions of exam, especially the pre and postcontrast dynamic images, are motion degraded. Lower chest: Pleural-parenchymal metastasis within the inferior left hemi thorax. This measures 1.5 cm maximally on image 7/ series 12001. Most directly compared to the prior MRI, measures similar (when remeasured). Hepatobiliary: Again identified are hepatic metastasis. These are compared on series 12001 and 1202 today to similar post-contrast images from the MRI of 08/06/2015 (more direct comparison than to the cross modality recent PET). Index anterior right hepatic lobe lesion measures 3.9 x 2.7 cm on image 38/ series 12001. Compare 3.8 x 2.6 cm on the prior exam (when remeasured). More central right hepatic lobe lesion measures 2.2 x 2.5 cm on image 34/ series 12001. Compare 1.8 x 1.8 cm on the prior MRI (when remeasured). High a right hepatic lobe 1.8 cm lesion image 8/series 12002 measures 1.5 cm on the prior MRI (when remeasured). A more posterior high right hepatic  lobe 2.0 cm lesion image 14/ series 12002 is enlarged from 1.6 cm on the prior MRI. Isolated gallstone without acute cholecystitis.  1.5 cm Pancreas: Normal, without mass or ductal dilatation. Spleen: Normal in size, without focal abnormality. Adrenals/Urinary Tract: Normal adrenal glands. Normal kidneys, without hydronephrosis. Stomach/Bowel: Normal stomach and abdominal bowel loops. Vascular/Lymphatic: Normal caliber of the aorta and branch vessels. Circumaortic left renal vein. Left periaortic retroperitoneal nodal metastasis measures 1.1 cm on image 50/series 12001. This is unchanged when compared to the MRI of 08/06/2015 and the PET of 09/05/2015. A portal caval node measures 10 mm on image 28/series 4 versus 12 mm on the prior PET. Other:  No ascites.  No evidence of omental or peritoneal disease. Musculoskeletal: Again identified are enhancing osseous lesions consistent with metastasis. The right-sided T11 lesion measures 1.3 cm on image 30/ series 12001. Compare 9 mm on 08/06/2015. A right-sided lamina lesion at L1 measures 12 mm on image 54/ series 12001 and is similar to the prior MRI (when remeasured). A lesion within the inferior aspect of approximately L3 is similar to on the prior exam including on image 39/series 13. Also not significantly changed back to 05/30/2015. IMPRESSION: 1. Since the MRI of 08/06/2015, mild progression of hepatic metastasis. 2. Similar left periaortic abdominal nodal metastasis. A portal caval node is slightly decreased in size compared to the PET of 09/05/2015. 3. Minimal progression of osseous metastasis since the 08/06/2015 MRI. 4. Similar but incompletely imaged pleural parenchymal metastasis within the inferior left hemi thorax. 5. Mildly motion degraded exam. 6. Cholelithiasis. Electronically Signed   By: Abigail Miyamoto M.D.   On: 11/23/2015 16:59   Dg Pelvis Comp Min 3v  Result Date: 11/29/2015 CLINICAL DATA:  History of metastatic breast cancer. Right-sided pelvic pain. EXAM: JUDET PELVIS - 3+ VIEW COMPARISON:  PET-CT 11/26/2015 FINDINGS: Mixed lytic and sclerotic area involving the right inferior pubic ramus consistent with metastatic disease. No pathologic fracture is identified. This is stable when compared to the prior CT scan. No hip or other definite pelvic lesions are identified. The pubic symphysis and SI joints are intact. IMPRESSION: Mixed lytic and sclerotic area involving the right inferior pubic ramus consistent with known metastatic disease. No pathologic fracture. No other definite pelvic bone lesions. Electronically Signed   By: Marijo Sanes M.D.   On: 11/29/2015 15:06   Nm Pet Image Restag (ps) Skull Base To Thigh  Result Date: 11/26/2015 CLINICAL DATA:  Subsequent treatment strategy  for restaging of metastatic breast cancer. Right breast primary. Metastatic disease to mediastinal nodes, left pleural space, retroperitoneum, liver, and bones. Re-evaluate. EXAM: NUCLEAR MEDICINE PET SKULL BASE TO THIGH TECHNIQUE: 8.9 mCi F-18 FDG was injected intravenously. Full-ring PET imaging was performed from the skull base to thigh after the radiotracer. CT data was obtained and used for attenuation correction and anatomic localization. FASTING BLOOD GLUCOSE:  Value: 93 mg/dl COMPARISON:  Prior PET 09/05/2015.  Abdominal MRI of 3 days ago. FINDINGS: NECK No areas of abnormal hypermetabolism. CHEST Left axillary node measures 7 mm and a S.U.V. max of 2.4 on image 83/ series 4. Compare 11 mm and a S.U.V. max of 6.5 previously. A prevascular node measures 9 mm and a S.U.V. max of 5.2 on image 83/series 4. Compare 6 mm and a S.U.V. max of 5.0 on the prior. Pleural-based left lower lobe lung hypermetabolism is less well-defined today. Measures a S.U.V. max of 4.5, including on image 104/series 4. On the prior, this measures a S.U.V. max of  7.8. Left anterior medial pleural hypermetabolism measures a S.U.V. max of 6.3 today versus a S.U.V. max of 8.2 on the prior exam (when remeasured). A left-sided mediastinal node measures 8 mm and a S.U.V. max of 4.4 on image 85/ series 4. Compare 9 mm and a S.U.V. max of 6.6 on the prior. ABDOMEN/PELVIS Multifocal hepatic hypermetabolism. The largest area of hypermetabolism is within the anterior portion right hepatic lobe. This is somewhat less well-defined today, measuring 4.1 cm and a S.U.V. max of 7.4 on image 117/series 4. Compare 4.2 cm and a S.U.V. max of 7.9 on the prior. Caudate lobe focus of hypermetabolism measures a S.U.V. max of 5.5 today versus a S.U.V. max of 8.3 on the prior. Central right hepatic lobe metastasis measures 1.9 cm and a S.U.V. max of 5.2 today versus 1.9 cm and a S.U.V. max of 7.3 on the prior. The left periaortic retroperitoneal node measures 10  mm and a S.U.V. max of 4.5 on image 130/series 4. Compare 11 mm and a S.U.V. max of 6.5 on the prior PET. A portal caval node measures 9 mm and a S.U.V. max of 3.8 on image 126/series 4. Compare 12 mm and a S.U.V. max of 7.7 on the prior PET. SKELETON Hypermetabolic focus within the right posterior portion of the T11 vertebral body measures a S.U.V. max of 11.5 today versus a S.U.V. max of 7.8 on the prior. Low-level hypermetabolism about the right inferior pubic ramus measures a S.U.V. max of 6.8 today versus a S.U.V. max of 3.8 previously. CT IMAGES PERFORMED FOR ATTENUATION CORRECTION Lytic lesions in the skull, including a left parietal 19 mm lesion, similar. No cervical adenopathy. Right axillary node dissection. Lad coronary artery atherosclerosis, including on image 92/series 4. 6 mm nodule in the right upper or right middle lobe is similar including on image 85/series 4. Radiation fibrosis in the left apex. Rounded atelectasis in the left lower lobe. Circumaortic left renal vein. Abdominal aortic atherosclerosis. Mild pelvic floor laxity. IMPRESSION: 1. Mild metabolic response to therapy within the chest. 2. Mild metabolic response to therapy within the liver and abdominal nodal stations. 3. Increased hypermetabolism corresponding to osseous metastasis, including a dominant right-sided T11 lesion. 4. No new sites of disease. 5.  Coronary artery atherosclerosis. Aortic atherosclerosis. Electronically Signed   By: Abigail Miyamoto M.D.   On: 11/26/2015 15:15    Impression:    The patient overall is clinically doing well but is having some discomfort especially in the right inferior pelvis and the left lateral inferior chest. I have had a chance to review her imaging studies and I believe that retreatment to both of these areas is very reasonable. Some increased PET scan is present in the left ninth rib laterally which appears to be the source of her pain. She has received prior radiation treatment to this area  but certainly I believe she could receive additional treatment to this spot. Likewise, she has received radiation treatment previously to the right inferior pubic ramus. Recent PET scan did show some increased activity in this location and an MRI scan also confirmed a lesion at this location which was associated with a nondisplaced fracture. Some surrounding edema was present within the musculature associated with this. This appears to represent likely residual disease which is remaining to be an irritant in this area. I believe once again that additional radiation treatment could be given to this site as well and I would expect this to be tolerated well. Given its location and  extent of disease, this would not require a large area of treatment and I believe that the internal organs could be adequately spared.   Plan:  We therefore have decided to proceed with additional palliative radiation treatment to the left ninth rib and to the right inferior pubic ramus. After discussing possible treatment schemes, we discussed treating the patient to a dose of 24 gray in 8 fractions at 3 gray per fraction. Depending on how she does with this treatment, we could consider extending her treatment for 2 more fractions to yield a dose of 30 gray. The patient however based on prior treatment is hesitant to go beyond 24 gray in therefore we will see how she does during this treatment. In terms of the spine, we discussed having the patient undergo a MRI scan of the thoracic spine during her next set of films and she is scheduled to undergo a PET scan in several months through medical oncology. The patient therefore will undergo simulation tomorrow such that we can proceed with treatment planning. I anticipate performing a Sim verification on 12/21/2015 and starting her first fraction of radiation treatment the following Monday.    [name] Gross, M.D., Ph.D.

## 2015-12-17 ENCOUNTER — Ambulatory Visit: Payer: 59 | Admitting: Physical Therapy

## 2015-12-17 ENCOUNTER — Other Ambulatory Visit (HOSPITAL_BASED_OUTPATIENT_CLINIC_OR_DEPARTMENT_OTHER): Payer: 59

## 2015-12-17 ENCOUNTER — Telehealth: Payer: Self-pay | Admitting: *Deleted

## 2015-12-17 DIAGNOSIS — C50211 Malignant neoplasm of upper-inner quadrant of right female breast: Secondary | ICD-10-CM

## 2015-12-17 DIAGNOSIS — C7951 Secondary malignant neoplasm of bone: Secondary | ICD-10-CM

## 2015-12-17 DIAGNOSIS — M79651 Pain in right thigh: Secondary | ICD-10-CM | POA: Diagnosis not present

## 2015-12-17 DIAGNOSIS — C771 Secondary and unspecified malignant neoplasm of intrathoracic lymph nodes: Secondary | ICD-10-CM | POA: Diagnosis not present

## 2015-12-17 DIAGNOSIS — J90 Pleural effusion, not elsewhere classified: Secondary | ICD-10-CM

## 2015-12-17 DIAGNOSIS — I890 Lymphedema, not elsewhere classified: Secondary | ICD-10-CM

## 2015-12-17 LAB — CBC WITH DIFFERENTIAL/PLATELET
BASO%: 1.9 % (ref 0.0–2.0)
BASOS ABS: 0.1 10*3/uL (ref 0.0–0.1)
EOS%: 2.1 % (ref 0.0–7.0)
Eosinophils Absolute: 0.1 10*3/uL (ref 0.0–0.5)
HEMATOCRIT: 37.3 % (ref 34.8–46.6)
HGB: 12.7 g/dL (ref 11.6–15.9)
LYMPH#: 0.7 10*3/uL — AB (ref 0.9–3.3)
LYMPH%: 23.6 % (ref 14.0–49.7)
MCH: 36.7 pg — AB (ref 25.1–34.0)
MCHC: 34.1 g/dL (ref 31.5–36.0)
MCV: 107.6 fL — AB (ref 79.5–101.0)
MONO#: 0.3 10*3/uL (ref 0.1–0.9)
MONO%: 8.7 % (ref 0.0–14.0)
NEUT#: 2 10*3/uL (ref 1.5–6.5)
NEUT%: 63.7 % (ref 38.4–76.8)
PLATELETS: 331 10*3/uL (ref 145–400)
RBC: 3.46 10*6/uL — AB (ref 3.70–5.45)
RDW: 14.2 % (ref 11.2–14.5)
WBC: 3.2 10*3/uL — ABNORMAL LOW (ref 3.9–10.3)

## 2015-12-17 LAB — COMPREHENSIVE METABOLIC PANEL
ALT: 33 U/L (ref 0–55)
ANION GAP: 9 meq/L (ref 3–11)
AST: 28 U/L (ref 5–34)
Albumin: 4 g/dL (ref 3.5–5.0)
Alkaline Phosphatase: 73 U/L (ref 40–150)
BUN: 10 mg/dL (ref 7.0–26.0)
CALCIUM: 9.8 mg/dL (ref 8.4–10.4)
CHLORIDE: 102 meq/L (ref 98–109)
CO2: 28 meq/L (ref 22–29)
Creatinine: 0.8 mg/dL (ref 0.6–1.1)
EGFR: 79 mL/min/{1.73_m2} — AB (ref >90)
Glucose: 80 mg/dl (ref 70–140)
POTASSIUM: 4.3 meq/L (ref 3.5–5.1)
Sodium: 139 mEq/L (ref 136–145)
Total Bilirubin: 0.31 mg/dL (ref 0.20–1.20)
Total Protein: 7.8 g/dL (ref 6.4–8.3)

## 2015-12-17 NOTE — Therapy (Signed)
[name] [name], [place], [address] Phone: [phone]   Fax:  [phone]  Physical Therapy Treatment  Patient Details  Name: [name] [name] MRN: [id number] Date of Birth: Sep 08, 1960 Referring Provider: Dr. Lurline Del  Encounter Date: 12/17/2015      PT End of Session - 12/17/15 1346    Visit Number [id number] for lymph   Number of Visits 95  for lymph   Date for PT Re-Evaluation 03/03/16   Authorization - Visit Number 140   PT Start Time 1025  patient late today   PT Stop Time 1100   PT Time Calculation (min) 35 min   Activity Tolerance Patient tolerated treatment well   Behavior During Therapy [facility] for tasks assessed/performed      Past Medical History:  Diagnosis Date  . Bone metastases ([place]) dx'd 05/2014  . Breast cancer ([place]) dx'd 2005/2011  . Peripheral vascular disease ([place]) 02/2010   blood clot related to porta cath  . PONV (postoperative nausea and vomiting)   . S/P radiation therapy 07/17/2014 through 08/02/2014    Left mediastinum, left seventh rib 3250 cGy in 13 sessions   . S/P radiation therapy 12/11/2014 through 12/22/2014    Left parietal calvarium 2400 cGy in 8 sessions   . Seizures ([place]) 2010   Isolated incident.    Past Surgical History:  Procedure Laterality Date  . AXILLARY LYMPH NODE DISSECTION  Dec. 2011  . BREAST LUMPECTOMY  2005  . MEDIASTINOTOMY CHAMBERLAIN MCNEIL Left 06/02/2013   Procedure: MEDIASTINOTOMY CHAMBERLAIN MCNEIL;  Surgeon: [name], MD;  Location: [facility];  Service: Thoracic;  Laterality: Left;  LEFT ANTERIOR MEDIASTINOTOMY   . PORTACATH PLACEMENT  12/11  . removal portacath      There were no vitals filed for this visit.      Subjective Assessment - 12/17/15 1342    Subjective I  stubbed my toe and may have broken it.   Currently in Pain? Yes   Pain Score 6    Pain Location Axilla   Pain Orientation Right   Pain Descriptors / Indicators Other (Comment)  full   Pain Type Chronic pain   Aggravating Factors  increased swelling   Pain Relieving Factors therapy   Pain Score 3   Pain Location Groin   Pain Orientation Right   Pain Descriptors / Indicators Burning   Pain Type Chronic pain   Aggravating Factors  walking more   Pain Relieving Factors therapy, especially  myofascial release                         [facility] Adult PT Treatment/Exercise - 12/17/15 0001      Manual Therapy   Myofascial Release right axilla with focus on scar tightness   Manual Lymphatic Drainage (MLD) In left sidelying, posterior interaxillary anastomosis and right axillo-inguinal anastomosis; in supine, short neck, left axilla and anterior interaxillary anastomosis, right groin and axillo-inguinal anastomosis, and right upper extremity from fingers to shoulder.  In right sidelying, left periscapular area toward left groin.   Other Manual Therapy soft tissue work at superior aspect of right medial tin prone position for pain relief and muscle relaxation, gentle myofascial release at upper thigh                PT Education - 12/17/15 1345    Education provided Yes   Education Details to ice toe for 10 minutes at a time  the next several days   Person(s) Educated Patient   Methods Explanation   Comprehension Verbalized understanding          PT Short Term Goals - 02/27/15 1011      PT SHORT TERM GOAL #1   Title pain with walking decreased >/= 25%   Time 3   Period Weeks   Status Achieved     PT SHORT TERM GOAL #2   Title pelvis stays in correct alignment for 3 weeks   Time 3   Period Weeks   Status Achieved           PT Long Term Goals - 08/14/15 1317      PT LONG TERM GOAL #1   Title indpendent with HEP   Time 12   Period Weeks   Status  On-going     PT LONG TERM GOAL #2   Title pain with walking decreased >/= 80%   Time 12   Period Weeks   Status On-going     PT LONG TERM GOAL #3   Title go up and down steps with a step over step pattern   Time 6   Status Deferred     PT LONG TERM GOAL #4   Title being able to last for 5 days without therapy and pain not going above a 8/10 due to decreased muscle soreness   Time 12   Period Weeks   Status Achieved           Long Term Clinic Goals - 12/10/15 1234      CC Long Term Goal  #1   Title Reports left upper back and left side pain is controlled at 4/10 or less with therapy at the current frequency.   Status Achieved     CC Long Term Goal  #2   Title Pt. will report swelling is adequately managed to enable ADL function at a consistent level.   Status On-going     CC Long Term Goal  #4   Title Pain/discomfort at right axilla area will be controlled at 6/10 or less.   Status On-going     CC Long Term Goal  #5   Title Patient will avoid infection by ongoing management of her lymphedema at right breast/axilla/upper arm areas.   Status On-going     CC Long Term Goal  #6   Title Pain in right groin/ischial tuberosity area will be controlled at 3/10 level or less to enable walking with less pain.   Time 12   Period Weeks   Status New     Additional Goals   Additional Goals Yes            Plan - 12/17/15 1346    Clinical Impression Statement Patient limping today, probably from having stubbed her toe this morning; toe is discolored at time of session.   Rehab Potential Good   Clinical Impairments Affecting Rehab Potential active cancer   PT Frequency 2x / week   PT Duration 12 weeks   PT Treatment/Interventions Manual techniques;Manual lymph drainage   PT Next Visit Plan Continue manual lymph drainage for right upper outer breast and for right UE; also for left rib pain; continue right thigh soft tissue work focused on medial right thigh with myofascial  release   Consulted and Agree with Plan of Care Patient   PT Plan Continue manual lymph drainage, soft tissue work, myofascial release.      Patient will benefit from skilled therapeutic  intervention in order to improve the following deficits and impairments:  Pain, Increased muscle spasms, Difficulty walking, Increased edema  Visit Diagnosis: Lymphedema, not elsewhere classified  Pain in right thigh     Problem List Patient Active Problem List   Diagnosis Date Noted  . 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 Z[place] 04/04/2015  . Nausea with vomiting 11/18/2014  . Constipation 11/18/2014  . Left-sided thoracic back pain   . Bone metastases ([place]) 11/16/2014  . Back pain 11/15/2014  . Uncontrolled pain 11/14/2014  . 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 Chest wall pain 03/21/2014  . Abnormal LFTs (liver function tests) 09/12/2013  . Breast cancer of upper-inner quadrant of right female breast ([place]) 08/18/2013  . 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

## 2015-12-17 NOTE — Telephone Encounter (Signed)
Pt here for lab & states she stubbed her R toe-looks like toe next to little toe & it is turning black.  This happened early this am.  Talked to pt & there is some brusing of Rt base of toes laterally.  Encouraged ice, elevation, & to call if redness,heat, or fever occurs.  She expressed understanding. Dr Jana Hakim informed & was in agreement of advice.

## 2015-12-18 LAB — CANCER ANTIGEN 27.29: CA 27.29: 1754.1 U/mL — ABNORMAL HIGH (ref 0.0–38.6)

## 2015-12-19 ENCOUNTER — Telehealth: Payer: Self-pay | Admitting: *Deleted

## 2015-12-19 ENCOUNTER — Ambulatory Visit (HOSPITAL_BASED_OUTPATIENT_CLINIC_OR_DEPARTMENT_OTHER): Payer: 59

## 2015-12-19 VITALS — BP 131/90 | HR 95 | Temp 97.7°F | Resp 18

## 2015-12-19 DIAGNOSIS — C7951 Secondary malignant neoplasm of bone: Secondary | ICD-10-CM

## 2015-12-19 DIAGNOSIS — C50211 Malignant neoplasm of upper-inner quadrant of right female breast: Secondary | ICD-10-CM | POA: Diagnosis not present

## 2015-12-19 DIAGNOSIS — C771 Secondary and unspecified malignant neoplasm of intrathoracic lymph nodes: Secondary | ICD-10-CM

## 2015-12-19 DIAGNOSIS — Z5112 Encounter for antineoplastic immunotherapy: Secondary | ICD-10-CM

## 2015-12-19 MED ORDER — DENOSUMAB 120 MG/1.7ML ~~LOC~~ SOLN
120.0000 mg | Freq: Once | SUBCUTANEOUS | Status: AC
Start: 2015-12-19 — End: 2015-12-19
  Administered 2015-12-19: 120 mg via SUBCUTANEOUS
  Filled 2015-12-19: qty 1.7

## 2015-12-19 MED ORDER — FULVESTRANT 250 MG/5ML IM SOLN
500.0000 mg | Freq: Once | INTRAMUSCULAR | Status: AC
Start: 2015-12-19 — End: 2015-12-19
  Administered 2015-12-19: 500 mg via INTRAMUSCULAR
  Filled 2015-12-19: qty 10

## 2015-12-19 NOTE — Telephone Encounter (Signed)
Returned call to the patient, she had left vm, she stated " I'm to come in to see Dr. Lisbeth Renshaw first before Ct simulation  On Friday, there isn't a time when to see him, he said come after my physical therapy, " checked MD schedule her ct sim is for 1255pm, she can be here around 1130ish  Stated, I put her an appt for 1145am to see MD, he is booked up with PUT's and SRS  Treatment during her time to be here to talk with MD about ct sim,ulation 10:38 AM

## 2015-12-19 NOTE — Patient Instructions (Signed)
Denosumab injection What is this medicine? DENOSUMAB (den oh sue mab) slows bone breakdown. Prolia is used to treat osteoporosis in women after menopause and in men. Xgeva is used to prevent bone fractures and other bone problems caused by cancer bone metastases. Xgeva is also used to treat giant cell tumor of the bone. This medicine may be used for other purposes; ask your health care provider or pharmacist if you have questions. What should I tell my health care provider before I take this medicine? They need to know if you have any of these conditions: -dental disease -eczema -infection or history of infections -kidney disease or on dialysis -low blood calcium or vitamin D -malabsorption syndrome -scheduled to have surgery or tooth extraction -taking medicine that contains denosumab -thyroid or parathyroid disease -an unusual reaction to denosumab, other medicines, foods, dyes, or preservatives -pregnant or trying to get pregnant -breast-feeding How should I use this medicine? This medicine is for injection under the skin. It is given by a health care professional in a hospital or clinic setting. If you are getting Prolia, a special MedGuide will be given to you by the pharmacist with each prescription and refill. Be sure to read this information carefully each time. For Prolia, talk to your pediatrician regarding the use of this medicine in children. Special care may be needed. For Xgeva, talk to your pediatrician regarding the use of this medicine in children. While this drug may be prescribed for children as young as 13 years for selected conditions, precautions do apply. Overdosage: If you think you have taken too much of this medicine contact a poison control center or emergency room at once. NOTE: This medicine is only for you. Do not share this medicine with others. What if I miss a dose? It is important not to miss your dose. Call your doctor or health care professional if you are  unable to keep an appointment. What may interact with this medicine? Do not take this medicine with any of the following medications: -other medicines containing denosumab This medicine may also interact with the following medications: -medicines that suppress the immune system -medicines that treat cancer -steroid medicines like prednisone or cortisone This list may not describe all possible interactions. Give your health care provider a list of all the medicines, herbs, non-prescription drugs, or dietary supplements you use. Also tell them if you smoke, drink alcohol, or use illegal drugs. Some items may interact with your medicine. What should I watch for while using this medicine? Visit your doctor or health care professional for regular checks on your progress. Your doctor or health care professional may order blood tests and other tests to see how you are doing. Call your doctor or health care professional if you get a cold or other infection while receiving this medicine. Do not treat yourself. This medicine may decrease your body's ability to fight infection. You should make sure you get enough calcium and vitamin D while you are taking this medicine, unless your doctor tells you not to. Discuss the foods you eat and the vitamins you take with your health care professional. See your dentist regularly. Brush and floss your teeth as directed. Before you have any dental work done, tell your dentist you are receiving this medicine. Do not become pregnant while taking this medicine or for 5 months after stopping it. Women should inform their doctor if they wish to become pregnant or think they might be pregnant. There is a potential for serious side effects   to an unborn child. Talk to your health care professional or pharmacist for more information. What side effects may I notice from receiving this medicine? Side effects that you should report to your doctor or health care professional as soon as  possible: -allergic reactions like skin rash, itching or hives, swelling of the face, lips, or tongue -breathing problems -chest pain -fast, irregular heartbeat -feeling faint or lightheaded, falls -fever, chills, or any other sign of infection -muscle spasms, tightening, or twitches -numbness or tingling -skin blisters or bumps, or is dry, peels, or red -slow healing or unexplained pain in the mouth or jaw -unusual bleeding or bruising Side effects that usually do not require medical attention (Report these to your doctor or health care professional if they continue or are bothersome.): -muscle pain -stomach upset, gas This list may not describe all possible side effects. Call your doctor for medical advice about side effects. You may report side effects to FDA at 1-800-FDA-1088. Where should I keep my medicine? This medicine is only given in a clinic, doctor's office, or other health care setting and will not be stored at home. NOTE: This sheet is a summary. It may not cover all possible information. If you have questions about this medicine, talk to your doctor, pharmacist, or health care provider.    2016, Elsevier/Gold Standard. (2011-09-15 12:37:47) Fulvestrant injection What is this medicine? FULVESTRANT (ful VES trant) blocks the effects of estrogen. It is used to treat breast cancer. This medicine may be used for other purposes; ask your health care provider or pharmacist if you have questions. What should I tell my health care provider before I take this medicine? They need to know if you have any of these conditions: -bleeding problems -liver disease -low levels of platelets in the blood -an unusual or allergic reaction to fulvestrant, other medicines, foods, dyes, or preservatives -pregnant or trying to get pregnant -breast-feeding How should I use this medicine? This medicine is for injection into a muscle. It is usually given by a health care professional in a  hospital or clinic setting. Talk to your pediatrician regarding the use of this medicine in children. Special care may be needed. Overdosage: If you think you have taken too much of this medicine contact a poison control center or emergency room at once. NOTE: This medicine is only for you. Do not share this medicine with others. What if I miss a dose? It is important not to miss your dose. Call your doctor or health care professional if you are unable to keep an appointment. What may interact with this medicine? -medicines that treat or prevent blood clots like warfarin, enoxaparin, and dalteparin This list may not describe all possible interactions. Give your health care provider a list of all the medicines, herbs, non-prescription drugs, or dietary supplements you use. Also tell them if you smoke, drink alcohol, or use illegal drugs. Some items may interact with your medicine. What should I watch for while using this medicine? Your condition will be monitored carefully while you are receiving this medicine. You will need important blood work done while you are taking this medicine. Do not become pregnant while taking this medicine or for at least 1 year after stopping it. Women of child-bearing potential will need to have a negative pregnancy test before starting this medicine. Women should inform their doctor if they wish to become pregnant or think they might be pregnant. There is a potential for serious side effects to an unborn child. Men   should inform their doctors if they wish to father a child. This medicine may lower sperm counts. Talk to your health care professional or pharmacist for more information. Do not breast-feed an infant while taking this medicine or for 1 year after the last dose. What side effects may I notice from receiving this medicine? Side effects that you should report to your doctor or health care professional as soon as possible: -allergic reactions like skin rash,  itching or hives, swelling of the face, lips, or tongue -feeling faint or lightheaded, falls -pain, tingling, numbness, or weakness in the legs -signs and symptoms of infection like fever or chills; cough; flu-like symptoms; sore throat -vaginal bleeding Side effects that usually do not require medical attention (report to your doctor or health care professional if they continue or are bothersome): -aches, pains -constipation -diarrhea -headache -hot flashes -nausea, vomiting -pain at site where injected -stomach pain This list may not describe all possible side effects. Call your doctor for medical advice about side effects. You may report side effects to FDA at 1-800-FDA-1088. Where should I keep my medicine? This drug is given in a hospital or clinic and will not be stored at home. NOTE: This sheet is a summary. It may not cover all possible information. If you have questions about this medicine, talk to your doctor, pharmacist, or health care provider.    2016, Elsevier/Gold Standard. (2014-10-13 11:03:55)  

## 2015-12-20 DIAGNOSIS — Z510 Encounter for antineoplastic radiation therapy: Secondary | ICD-10-CM | POA: Diagnosis not present

## 2015-12-21 ENCOUNTER — Ambulatory Visit: Payer: 59 | Admitting: Physical Therapy

## 2015-12-21 ENCOUNTER — Ambulatory Visit
Admission: RE | Admit: 2015-12-21 | Discharge: 2015-12-21 | Disposition: A | Discharge status: Home / Self Care | Payer: 59 | Source: Ambulatory Visit | Attending: Radiation Oncology | Admitting: Radiation Oncology

## 2015-12-21 DIAGNOSIS — M79651 Pain in right thigh: Secondary | ICD-10-CM

## 2015-12-21 DIAGNOSIS — Z510 Encounter for antineoplastic radiation therapy: Secondary | ICD-10-CM | POA: Diagnosis not present

## 2015-12-21 DIAGNOSIS — I890 Lymphedema, not elsewhere classified: Secondary | ICD-10-CM

## 2015-12-21 NOTE — Therapy (Signed)
[place], [place], [address] Phone: [phone]   Fax:  [phone]  Physical Therapy Treatment  Patient Details  Name: [name] [name] MRN: [id number] Date of Birth: 1960-05-11 Referring Provider: Dr. Lurline Del  Encounter Date: 12/21/2015      PT End of Session - 12/21/15 1242    Visit Number [id number] for lymph   Number of Visits 95  for lymph   Date for PT Re-Evaluation 03/03/16   Authorization - Visit Number 140   PT Start Time 1025   PT Stop Time 1103   PT Time Calculation (min) 38 min   Activity Tolerance Patient tolerated treatment well   Behavior During Therapy [facility] for tasks assessed/performed      Past Medical History:  Diagnosis Date  . Bone metastases ([place]) dx'd 05/2014  . Breast cancer ([place]) dx'd 2005/2011  . Peripheral vascular disease ([place]) 02/2010   blood clot related to porta cath  . PONV (postoperative nausea and vomiting)   . S/P radiation therapy 07/17/2014 through 08/02/2014    Left mediastinum, left seventh rib 3250 cGy in 13 sessions   . S/P radiation therapy 12/11/2014 through 12/22/2014    Left parietal calvarium 2400 cGy in 8 sessions   . Seizures ([place]) 2010   Isolated incident.    Past Surgical History:  Procedure Laterality Date  . AXILLARY LYMPH NODE DISSECTION  Dec. 2011  . BREAST LUMPECTOMY  2005  . MEDIASTINOTOMY CHAMBERLAIN MCNEIL Left 06/02/2013   Procedure: MEDIASTINOTOMY CHAMBERLAIN MCNEIL;  Surgeon: [name], MD;  Location: [place];  Service: Thoracic;  Laterality: Left;  LEFT ANTERIOR MEDIASTINOTOMY   . PORTACATH PLACEMENT  12/11  . removal portacath      There were no vitals filed for this visit.      Subjective Assessment - 12/21/15 1026    Subjective (P)  "I'm really nervous  about radiation, but I'm trying to let it go. I'm hurting." The right upper outer breast area feels softer, but that area behind there has been trying to seize.   Currently in Pain? (P)  Yes   Pain Score (P)  6    Pain Location (P)  Axilla   Pain Orientation (P)  Right   Pain Score (P)  3   Pain Location (P)  Groin   Pain Orientation (P)  Right   Pain Descriptors / Indicators (P)  Burning                         [facility] Adult PT Treatment/Exercise - 12/21/15 0001      Manual Therapy   Myofascial Release right axilla with focus on scar tightness   Manual Lymphatic Drainage (MLD) In left sidelying, posterior interaxillary anastomosis and right axillo-inguinal anastomosis; in supine, short neck, left axilla and anterior interaxillary anastomosis, right groin and axillo-inguinal anastomosis, and right upper extremity from fingers to shoulder.  In right sidelying, left periscapular area toward left groin.   Other Manual Therapy soft tissue work at superior aspect of right medial tin prone position for pain relief and muscle relaxation, gentle myofascial release at upper thigh                  PT Short Term Goals - 02/27/15 1011      PT SHORT TERM GOAL #1   Title pain with walking decreased >/= 25%   Time 3   Period Weeks   Status  Achieved     PT SHORT TERM GOAL #2   Title pelvis stays in correct alignment for 3 weeks   Time 3   Period Weeks   Status Achieved           PT Long Term Goals - 08/14/15 1317      PT LONG TERM GOAL #1   Title indpendent with HEP   Time 12   Period Weeks   Status On-going     PT LONG TERM GOAL #2   Title pain with walking decreased >/= 80%   Time 12   Period Weeks   Status On-going     PT LONG TERM GOAL #3   Title go up and down steps with a step over step pattern   Time 6   Status Deferred     PT LONG TERM GOAL #4   Title being able to last for 5 days without therapy and pain not going above a 8/10 due to  decreased muscle soreness   Time 12   Period Weeks   Status Achieved           Long Term Clinic Goals - 12/21/15 1245      CC Long Term Goal  #2   Title Pt. will report swelling is adequately managed to enable ADL function at a consistent level.   Status On-going     CC Long Term Goal  #4   Title Pain/discomfort at right axilla area will be controlled at 6/10 or less.   Status On-going     CC Long Term Goal  #5   Title Patient will avoid infection by ongoing management of her lymphedema at right breast/axilla/upper arm areas.   Status On-going     CC Long Term Goal  #6   Title Pain in right groin/ischial tuberosity area will be controlled at 3/10 level or less to enable walking with less pain.   Status On-going            Plan - 12/21/15 1243    Clinical Impression Statement Patient very anxious today as she has planned to have palliative radiation to some of the bony metastases, and now is having concerns about that.  She was to meet with radiation oncologist just after her PT session today.  Right upper outer breast tissue less indurated today.   Rehab Potential Good   Clinical Impairments Affecting Rehab Potential active cancer   PT Frequency 2x / week   PT Duration 12 weeks   PT Treatment/Interventions Manual techniques;Manual lymph drainage   PT Next Visit Plan Continue manual lymph drainage for right upper outer breast and for right UE; also for left rib pain; continue right thigh soft tissue work focused on medial right thigh with myofascial release   Consulted and Agree with Plan of Care Patient   PT Plan Continue manual lymph drainage, soft tissue work, myofascial release.      Patient will benefit from skilled therapeutic intervention in order to improve the following deficits and impairments:  Pain, Increased muscle spasms, Difficulty walking, Increased edema  Visit Diagnosis: Lymphedema, not elsewhere classified  Pain in right thigh     Problem  List Patient Active Problem List   Diagnosis Date Noted  . 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 Zoster 04/04/2015  . Nausea with vomiting 11/18/2014  . Constipation 11/18/2014  . Left-sided thoracic back pain   . Bone metastases ([place]) 11/16/2014  . Back pain 11/15/2014  . Uncontrolled pain 11/14/2014  .  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 Chest wall pain 03/21/2014  . Abnormal LFTs (liver function tests) 09/12/2013  . Breast cancer of upper-inner quadrant of right female breast ([place]) 08/18/2013  . 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

## 2015-12-22 NOTE — Addendum Note (Signed)
Encounter addended by: Kyung Rudd, MD on: 12/22/2015 11:24 AM<BR>    Actions taken: Sign clinical note

## 2015-12-22 NOTE — Progress Notes (Addendum)
  Radiation Oncology         [phone] ________________________________  Name: [name] [name] MRN: [id number]  Date: 12/14/2015  DOB: 08-25-1960  SIMULATION AND TREATMENT PLANNING NOTE  DIAGNOSIS:     ICD-9-CM ICD-10-CM   1. Bone metastases ([facility]) 198.5 C79.51      Site:   1.  Left lateral ninth rib 2.  Right inferior pubic ramus  NARRATIVE:  The patient was brought to the [facility].  Identity was confirmed.  All relevant records and images related to the planned course of therapy were reviewed.   Written consent to proceed with treatment was confirmed which was freely given after reviewing the details related to the planned course of therapy had been reviewed with the patient.  Then, the patient was set-up in a stable reproducible  supine position for radiation therapy.  CT images were obtained.  Surface markings were placed.    Medically necessary complex treatment device(s) for immobilization:  Customized vac lock bag.   The CT images were loaded into the planning software.  Then the target and avoidance structures were contoured.  Treatment planning then occurred.  The radiation prescription was entered and confirmed.  A total of 6 complex treatment devices were fabricated which relate to the designed radiation treatment fields: 3 customized fields to treat the left rib and 3 additional customized fields to treat the right pelvis. Each of these customized fields/ complex treatment devices will be used on a daily basis during the radiation course. I have requested : 3D Simulation  I have requested a DVH of the following structures: target volume, bladder, rectum.   The patient will undergo daily image guidance to ensure accurate localization of the target, and adequate minimize dose to the normal surrounding structures in close proximity to the target.   PLAN:  The patient will receive 24 Gy in 8 fractions.    Special treatment procedure The patient has  received prior radiation treatment to both of the target sites. This will require extra time intensive planning including a dose accumulation plan incorporating the patient's treatment to the sites previously. This therefore constitutes a special treatment procedure due to the reirradiation.  ________________________________   [name] Gross, MD, PhD

## 2015-12-24 ENCOUNTER — Other Ambulatory Visit: Payer: 59

## 2015-12-24 ENCOUNTER — Other Ambulatory Visit (HOSPITAL_BASED_OUTPATIENT_CLINIC_OR_DEPARTMENT_OTHER): Payer: 59

## 2015-12-24 ENCOUNTER — Ambulatory Visit
Admission: RE | Admit: 2015-12-24 | Discharge: 2015-12-24 | Disposition: A | Discharge status: Home / Self Care | Payer: 59 | Source: Ambulatory Visit | Attending: Radiation Oncology | Admitting: Radiation Oncology

## 2015-12-24 ENCOUNTER — Ambulatory Visit: Payer: 59 | Admitting: Physical Therapy

## 2015-12-24 DIAGNOSIS — C771 Secondary and unspecified malignant neoplasm of intrathoracic lymph nodes: Secondary | ICD-10-CM

## 2015-12-24 DIAGNOSIS — M79651 Pain in right thigh: Secondary | ICD-10-CM | POA: Diagnosis not present

## 2015-12-24 DIAGNOSIS — J90 Pleural effusion, not elsewhere classified: Secondary | ICD-10-CM

## 2015-12-24 DIAGNOSIS — Z510 Encounter for antineoplastic radiation therapy: Secondary | ICD-10-CM | POA: Diagnosis not present

## 2015-12-24 DIAGNOSIS — C50211 Malignant neoplasm of upper-inner quadrant of right female breast: Secondary | ICD-10-CM

## 2015-12-24 DIAGNOSIS — I890 Lymphedema, not elsewhere classified: Secondary | ICD-10-CM

## 2015-12-24 LAB — CBC WITH DIFFERENTIAL/PLATELET
BASO%: 1.2 % (ref 0.0–2.0)
BASOS ABS: 0 10*3/uL (ref 0.0–0.1)
EOS ABS: 0.1 10*3/uL (ref 0.0–0.5)
EOS%: 1.9 % (ref 0.0–7.0)
HEMATOCRIT: 32.6 % — AB (ref 34.8–46.6)
HEMOGLOBIN: 11.8 g/dL (ref 11.6–15.9)
LYMPH#: 0.6 10*3/uL — AB (ref 0.9–3.3)
LYMPH%: 19.2 % (ref 14.0–49.7)
MCH: 36.8 pg — AB (ref 25.1–34.0)
MCHC: 36.2 g/dL — ABNORMAL HIGH (ref 31.5–36.0)
MCV: 101.6 fL — AB (ref 79.5–101.0)
MONO#: 0.2 10*3/uL (ref 0.1–0.9)
MONO%: 7.1 % (ref 0.0–14.0)
NEUT#: 2.3 10*3/uL (ref 1.5–6.5)
NEUT%: 70.6 % (ref 38.4–76.8)
PLATELETS: 195 10*3/uL (ref 145–400)
RBC: 3.21 10*6/uL — ABNORMAL LOW (ref 3.70–5.45)
RDW: 13.2 % (ref 11.2–14.5)
WBC: 3.2 10*3/uL — ABNORMAL LOW (ref 3.9–10.3)
nRBC: 0 % (ref 0–0)

## 2015-12-24 NOTE — Therapy (Signed)
[place], [place], [address] Phone: [phone]   Fax:  [phone]  Physical Therapy Treatment  Patient Details  Name: [name] [name] MRN: [id number] Date of Birth: [birth year] Referring Provider: Dr. Lurline Del  Encounter Date: 12/24/2015      PT End of Session - 12/24/15 1255    Visit Number [id number] for lymph   Number of Visits 95  for lymph   Date for PT Re-Evaluation 03/03/16   Authorization - Visit Number 140   PT Start Time 1020   PT Stop Time 1115   PT Time Calculation (min) 55 min   Activity Tolerance Patient tolerated treatment well   Behavior During Therapy [facility] for tasks assessed/performed      Past Medical History:  Diagnosis Date  . Bone metastases ([place]) dx'd 05/2014  . Breast cancer ([place]) dx'd 2005/2011  . Peripheral vascular disease ([place]) 02/2010   blood clot related to porta cath  . PONV (postoperative nausea and vomiting)   . S/P radiation therapy 07/17/2014 through 08/02/2014    Left mediastinum, left seventh rib 3250 cGy in 13 sessions   . S/P radiation therapy 12/11/2014 through 12/22/2014    Left parietal calvarium 2400 cGy in 8 sessions   . Seizures ([place]) 2010   Isolated incident.    Past Surgical History:  Procedure Laterality Date  . AXILLARY LYMPH NODE DISSECTION  Dec. 2011  . BREAST LUMPECTOMY  2005  . MEDIASTINOTOMY CHAMBERLAIN MCNEIL Left 06/02/2013   Procedure: MEDIASTINOTOMY CHAMBERLAIN MCNEIL;  Surgeon: [name], MD;  Location: [place];  Service: Thoracic;  Laterality: Left;  LEFT ANTERIOR MEDIASTINOTOMY   . PORTACATH PLACEMENT  12/11  . removal portacath      There were no vitals filed for this visit.      Subjective Assessment - 12/24/15 1023    Subjective Had to use the  Flexitouch on Saturday after doing simulation on Friday; groin area hurt after that.   Currently in Pain? Yes   Pain Score 6    Pain Location Axilla   Pain Orientation Right   Pain Score 3   Pain Location Groin   Pain Orientation Right                         [facility] Adult PT Treatment/Exercise - 12/24/15 0001      Self-Care   Self-Care Other Self-Care Comments   Other Self-Care Comments  Checked fit of patient's new compression glove.  It does appear to fit well.  Discussed with patient that she should start wearing it part time and wash it per instructions in the box.  Discussed compression bra options, showing patient some pictures of options and discussing features that are important, especially a wide band that comes up to the axilla and compression on her sides     Manual Therapy   Myofascial Release right axilla with focus on scar tightness   Manual Lymphatic Drainage (MLD) In left sidelying, posterior interaxillary anastomosis and right axillo-inguinal anastomosis; in supine, short neck, left axilla and anterior interaxillary anastomosis, right groin and axillo-inguinal anastomosis, and right upper extremity from fingers to shoulder.  In right sidelying, left periscapular area toward left groin.   Other Manual Therapy soft tissue work at superior aspect of right medial thigh in prone position for pain relief and muscle relaxation, gentle myofascial release at upper thigh  PT Education - 12/24/15 1255    Education provided Yes   Education Details about compression bra options   Person(s) Educated Patient   Methods Explanation   Comprehension Verbalized understanding          PT Short Term Goals - 02/27/15 1011      PT SHORT TERM GOAL #1   Title pain with walking decreased >/= 25%   Time 3   Period Weeks   Status Achieved     PT SHORT TERM GOAL #2   Title pelvis stays in correct alignment for 3 weeks   Time 3   Period Weeks    Status Achieved           PT Long Term Goals - 08/14/15 1317      PT LONG TERM GOAL #1   Title indpendent with HEP   Time 12   Period Weeks   Status On-going     PT LONG TERM GOAL #2   Title pain with walking decreased >/= 80%   Time 12   Period Weeks   Status On-going     PT LONG TERM GOAL #3   Title go up and down steps with a step over step pattern   Time 6   Status Deferred     PT LONG TERM GOAL #4   Title being able to last for 5 days without therapy and pain not going above a 8/10 due to decreased muscle soreness   Time 12   Period Weeks   Status Achieved           Long Term Clinic Goals - 12/24/15 1300      CC Long Term Goal  #2   Title Pt. will report swelling is adequately managed to enable ADL function at a consistent level.   Status On-going     CC Long Term Goal  #4   Title Pain/discomfort at right axilla area will be controlled at 6/10 or less.   Status On-going     CC Long Term Goal  #5   Title Patient will avoid infection by ongoing management of her lymphedema at right breast/axilla/upper arm areas.   Status On-going     CC Long Term Goal  #6   Title Pain in right groin/ischial tuberosity area will be controlled at 3/10 level or less to enable walking with less pain.   Status On-going            Plan - 12/24/15 1256    Clinical Impression Statement Patient is calmer today and reports being at peace with the XRT plan; she started this morning with radiation to right inferior pubic ramus and left 9th rib.  XRT will last 8-10 treatments.  Her new glove appears to fit well.  We also talked about compression bras.  Patient does not seem too happy with either today.   Rehab Potential Good   Clinical Impairments Affecting Rehab Potential active cancer   PT Frequency 2x / week   PT Duration 12 weeks   PT Treatment/Interventions Manual techniques;Manual lymph drainage   PT Next Visit Plan Continue manual lymph drainage for right upper outer  breast and for right UE; also for left rib pain; continue right thigh soft tissue work focused on medial right thigh with myofascial release   Consulted and Agree with Plan of Care Patient   PT Plan Continue manual lymph drainage, soft tissue work, myofascial release.  Visit after next, patient plans to wear her sports bra  to her appointment for [place] to see if that is adequate.      Patient will benefit from skilled therapeutic intervention in order to improve the following deficits and impairments:  Pain, Increased muscle spasms, Difficulty walking, Increased edema  Visit Diagnosis: Lymphedema, not elsewhere classified  Pain in right thigh     Problem List Patient Active Problem List   Diagnosis Date Noted  . 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 Zoster 04/04/2015  . Nausea with vomiting 11/18/2014  . Constipation 11/18/2014  . Left-sided thoracic back pain   . Bone metastases ([place]) 11/16/2014  . Back pain 11/15/2014  . Uncontrolled pain 11/14/2014  . 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 Chest wall pain 03/21/2014  . Abnormal LFTs (liver function tests) 09/12/2013  . Breast cancer of upper-inner quadrant of right female breast ([place]) 08/18/2013  . 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

## 2015-12-25 ENCOUNTER — Ambulatory Visit
Admission: RE | Admit: 2015-12-25 | Discharge: 2015-12-25 | Disposition: A | Discharge status: Home / Self Care | Payer: 59 | Source: Ambulatory Visit | Attending: Radiation Oncology | Admitting: Radiation Oncology

## 2015-12-25 DIAGNOSIS — Z510 Encounter for antineoplastic radiation therapy: Secondary | ICD-10-CM | POA: Diagnosis not present

## 2015-12-26 ENCOUNTER — Ambulatory Visit
Admission: RE | Admit: 2015-12-26 | Discharge: 2015-12-26 | Disposition: A | Discharge status: Home / Self Care | Payer: 59 | Source: Ambulatory Visit | Attending: Radiation Oncology | Admitting: Radiation Oncology

## 2015-12-26 ENCOUNTER — Ambulatory Visit (HOSPITAL_BASED_OUTPATIENT_CLINIC_OR_DEPARTMENT_OTHER): Payer: 59 | Admitting: Oncology

## 2015-12-26 DIAGNOSIS — C778 Secondary and unspecified malignant neoplasm of lymph nodes of multiple regions: Secondary | ICD-10-CM | POA: Diagnosis not present

## 2015-12-26 DIAGNOSIS — C50211 Malignant neoplasm of upper-inner quadrant of right female breast: Secondary | ICD-10-CM | POA: Diagnosis not present

## 2015-12-26 DIAGNOSIS — C7951 Secondary malignant neoplasm of bone: Secondary | ICD-10-CM | POA: Diagnosis not present

## 2015-12-26 DIAGNOSIS — C771 Secondary and unspecified malignant neoplasm of intrathoracic lymph nodes: Secondary | ICD-10-CM

## 2015-12-26 DIAGNOSIS — Z510 Encounter for antineoplastic radiation therapy: Secondary | ICD-10-CM | POA: Diagnosis not present

## 2015-12-26 DIAGNOSIS — C787 Secondary malignant neoplasm of liver and intrahepatic bile duct: Secondary | ICD-10-CM

## 2015-12-26 NOTE — Progress Notes (Signed)
[place]  Telephone:(336) [phone] Fax:(336) [phone]     ID: [name] [name] OB: 15-May-1960  MR#: [id number]  WER#:[id number]  PCP: Pcp Not In System GYN:  [name] [name] SU:  OTHER MD: [name] [name], [name] [name], [name] [name], [name] [name], [name] [name], [name] [name]   CHIEF COMPLAINT: Stage IV breast cancer  CURRENT TREATMENT: Fulvestrant, denosumab, palbociclib  BREAST CANCER HISTORY: From doctor [name] [name]'s intake note 03/20/2004:  "The patient is a very pleasant 55 year old female, without significant past medical history.  Her family history is significant for a sister who at age 38 was diagnosed with invasive ductal carcinoma.  She is a breast cancer survivor at age 47 now.  The patient states that she has never really had a screening mammogram until October 2005, when she felt that it was time for her to start having mammograms done on a yearly basis.  Therefore, on 01/26/04, she underwent a screening mammogram and an abnormality was detected in the upper outer right breast.  She, therefore, underwent spot compression views of both the right and the left breast.  The left breast revealed a well-defined mass in the upper outer left quadrant, present at the 2 o'clock position, measuring 1.8 cm, 6 cm from the nipple.  This, by ultrasound, was felt to be a simple cyst measuring 1.8 cm.  On the right breast, a spiculated mass was noted in the upper outer right quadrant.  The ultrasound revealed a shadowing irregular solid mass at the 10:30 position, 9 cm from the nipple, measuring 1.2 cm in greatest dimension, correlating with the spiculated mass seen on the mammogram.  The right axilla was negative ultrasonically.  Because of this, the patient underwent a needle biopsy of the right breast and the biopsy was positive invasive mammary carcinoma that showed features consistent with a high-grade invasive ductal carcinoma associated with desmoplastic  stroma.  No in situ component was seen and no definite lymphovascular invasion was identified.  On the core biopsy, the tumor measured about 0.8 cm.  Because of this, she was seen by Dr. [name] and the patient was taken to the [facility] on March 15, 2004.  She underwent a right breast lumpectomy with sentinel node biopsy.  The final pathology revealed an invasive ductal carcinoma, measuring 1.7 cm, grade 2 of 3.  Margins were free of tumor.  Atypical lobular hyperplasia was noted.  One sentinel node was removed which was negative for metastatic disease.  The tumor was staged at T1c, N0 MX.  It was estrogen receptor positive, progesterone receptor positive.  HER-2/neu was 2+.  FISH was negative.  All margins were free of tumor.  She is now seen in Medical Oncology for further evaluation and management of this newly diagnosed T1c, node negative, stage I, invasive ductal carcinoma of the right breast."  Her subsequent history is as detailed below  INTERVAL HISTORY:  [name] [name] DEC 1, liver Bx dec-- HER-2 positive? PET DEC, liver MRI?) [name] [name] returns today for follow-up of her estrogen receptor positive breast cancer accompanied by her husband [name] [name]. She continues on monthly fulvestrant and denosumab/Xgeva and is tolerating those well.   She also receives 12 palbociclib at 75 mg 21 days on and 7 days off. She has been on this palbociclib dose since May. She generally tolerates it well, but tends to have more symptoms the third week. This includes increase in nasal secretions and cough, with epistaxis. This is improved with Claritin. She also has more fatigue.  Quite aside from these issues, she continues to have groin pain and has been evaluated by [name] [name] for additional radiation. She will receive 8 treatments to the pelvic lesion and the left ninth rib lesion starting today  REVIEW OF SYiSTEMS: [name] [name]'s counts continue to hold up, but we are concerned that with the radiation that may drop so we are  going to continue to check her labs on a weekly basis. She is finding it more difficult to get around because of the pelvic pain and this is keeping her from exercising more regularly. She uses naproxen occasionally for the pain and it does work when she uses it, but she is very much afraid of recurrent esophagitis problems. Also she stopped her left fourth toe, but did not lose her nail. Aside from that a detailed review of systems today was stable  PAST MEDICAL HISTORY: Past Medical History:  Diagnosis Date  . Bone metastases ([place]) dx'd 05/2014  . Breast cancer ([place] [place]) dx'd 2005/2011  . Peripheral vascular disease ([place]) 02/2010   blood clot related to porta cath  . PONV (postoperative nausea and vomiting)   . S/P radiation therapy 07/17/2014 through 08/02/2014    Left mediastinum, left seventh rib 3250 cGy in 13 sessions   . S/P radiation therapy 12/11/2014 through 12/22/2014    Left parietal calvarium 2400 cGy in 8 sessions   . Seizures ([place]) 2010   Isolated incident.    PAST SURGICAL HISTORY: Past Surgical History:  Procedure Laterality Date  . AXILLARY LYMPH NODE DISSECTION  Dec. 2011  . BREAST LUMPECTOMY  2005  . MEDIASTINOTOMY CHAMBERLAIN MCNEIL Left 06/02/2013   Procedure: MEDIASTINOTOMY CHAMBERLAIN MCNEIL;  Surgeon: [name] Nakayama, MD;  Location: [facility];  Service: Thoracic;  Laterality: Left;  LEFT ANTERIOR MEDIASTINOTOMY   . PORTACATH PLACEMENT  12/11  . removal portacath      FAMILY HISTORY Family History  Problem Relation Age of Onset  . COPD Mother   . Breast cancer Sister 73   The patient's father is living, 52 years old as of may 2015. He lives in [place]. The patient's mother died from complications of COPD at the age of 61. These has 2 brothers, one sister. Her sister developed breast cancer at the age of 34. She  is doing well. The patient herself underwent genetic testing at [facility] in 2011 and was found to be BRCA negative  GYNECOLOGIC HISTORY:  Menarche age 68, she is GX P0. She stopped having periods with her initial chemotherapy in 2006.  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 Her husband [name] [name] is a Medical illustrator in [place]. He has a child from a prior marriage. At home they have 2 rescue dogs, Hobo and Rio. The patient is religious but not a church attender    ADVANCED DIRECTIVES: In place; at the 08/04/2014 visit in particular the patient was very clear, with her husband present, that she would not want any kind of feeding tubes or "other tubes" if her condition deteriorated.   HEALTH MAINTENANCE: Social History  Substance Use Topics  . Smoking status: Never Smoker  . Smokeless tobacco: Never Used  . Alcohol use No     Colonoscopy:  PAP:  Bone density: March 2015; mild osteopenia  Lipid panel:  Allergies  Allergen Reactions  . 2nd Skin Quick Heal Other (See Comments)    Other Reaction: Skin peels  . Decadron [Dexamethasone] Other (  See Comments)    Patient does not tolerate steroids.   . Dilaudid [Hydromorphone] Nausea And Vomiting  . Enoxaparin Other (See Comments)    unknown  . Fluconazole Swelling    Liver toxicity  . Hydromorphone Hcl Nausea And Vomiting  . Morphine And Related Nausea And Vomiting  . Protonix [Pantoprazole Sodium] Other (See Comments)    Patient reports it caused thrush.  . Tegaderm Ag Mesh [Silver]     Current Outpatient Prescriptions  Medication Sig Dispense Refill  . ALPRAZolam (XANAX) 0.5 MG tablet Take 1 tablet (0.5 mg total) by mouth 2 (two) times daily as needed for anxiety. 30 tablet 0  . B Complex-C (B-COMPLEX WITH VITAMIN C) tablet Take 1 tablet by mouth daily. Reported on 03/27/2015    . calcium carbonate (TUMS - DOSED IN MG ELEMENTAL CALCIUM) 500 MG chewable  tablet Chew 1 tablet by mouth as directed.    . cholecalciferol 2000 UNITS tablet Take 1 tablet (2,000 Units total) by mouth daily.    . Diphenhyd-Hydrocort-Nystatin (FIRST-DUKES MOUTHWASH) SUSP 5-10 ml qid SWISH AND SPIT 482 mL 3  . folic acid (FOLVITE) 1 MG tablet Take 1 tablet (1 mg total) by mouth daily.    Marland Kitchen gabapentin (NEURONTIN) 300 MG capsule Take 1 capsule (300 mg total) by mouth at bedtime. 90 capsule 4  . loratadine-pseudoephedrine (CLARITIN-D 24-HOUR) 10-240 MG 24 hr tablet Take 1 tablet by mouth daily.    . Melatonin 3 MG TABS Take 3 mg by mouth at bedtime.    . naproxen sodium (ANAPROX) 220 MG tablet Take 220 mg by mouth 2 (two) times daily with a meal.    . palbociclib (IBRANCE) 75 MG capsule Take 1 capsule (75 mg total) by mouth daily with breakfast. Take whole with food. 21 capsule 6  . RABEprazole Sodium 5 MG CPSP Take 5 mg by mouth daily. 30 capsule 3  . saccharomyces boulardii (FLORASTOR) 250 MG capsule Take 250 mg by mouth daily.      No current facility-administered medications for this visit.     OBJECTIVE: Middle-aged white woman in no acute distress  There were no vitals filed for this visit.   There is no height or weight on file to calculate BMI.   There were no vitals filed for this visit.   Patient refused vitals today 12/26/2015     ECOG FS: 1  Sclerae unicteric, EOMs intact Oropharynx clear and moist No cervical or supraclavicular adenopathy Lungs no rales or rhonchi Heart regular rate and rhythm Abd soft, nontender, positive bowel sounds MSK no focal spinal tenderness, no upper extremity lymphedema Neuro: nonfocal, well oriented, appropriate affect Breasts: Deferred   LAB RESULTS:   CMP     Component Value Date/Time   NA 139 12/17/2015 1130   K 4.3 12/17/2015 1130   CL 103 12/14/2014 0800   CL 105 05/06/2012 1333   CO2 28 12/17/2015 1130   GLUCOSE 80 12/17/2015 1130   GLUCOSE 124 (H) 05/06/2012 1333   BUN 10.0 12/17/2015 1130   CREATININE  0.8 12/17/2015 1130   CALCIUM 9.8 12/17/2015 1130   PROT 7.8 12/17/2015 1130   ALBUMIN 4.0 12/17/2015 1130   AST 28 12/17/2015 1130   ALT 33 12/17/2015 1130   ALKPHOS 73 12/17/2015 1130   BILITOT 0.31 12/17/2015 1130   GFRNONAA >60 12/14/2014 0800   GFRAA >60 12/14/2014 0800    No results found for: SPEP  Lab Results  Component Value Date   WBC 3.2 (L) 12/24/2015  NEUTROABS 2.3 12/24/2015   HGB 11.8 12/24/2015   HCT 32.6 (L) 12/24/2015   MCV 101.6 (H) 12/24/2015   PLT 195 12/24/2015      Chemistry      Component Value Date/Time   NA 139 12/17/2015 1130   K 4.3 12/17/2015 1130   CL 103 12/14/2014 0800   CL 105 05/06/2012 1333   CO2 28 12/17/2015 1130   BUN 10.0 12/17/2015 1130   CREATININE 0.8 12/17/2015 1130      Component Value Date/Time   CALCIUM 9.8 12/17/2015 1130   ALKPHOS 73 12/17/2015 1130   AST 28 12/17/2015 1130   ALT 33 12/17/2015 1130   BILITOT 0.31 12/17/2015 1130     No results for input(s): INR in the last 168 hours.  Urinalysis    Component Value Date/Time   COLORURINE YELLOW 11/17/2014 0143   APPEARANCEUR CLOUDY (A) 11/17/2014 0143   LABSPEC 1.010 11/17/2014 0143   LABSPEC 1.005 09/12/2013 1542   PHURINE 6.5 11/17/2014 0143   GLUCOSEU NEGATIVE 11/17/2014 0143   GLUCOSEU Negative 09/12/2013 1542   HGBUR NEGATIVE 11/17/2014 0143   BILIRUBINUR NEGATIVE 11/17/2014 0143   BILIRUBINUR Negative 09/12/2013 1542   KETONESUR NEGATIVE 11/17/2014 0143   PROTEINUR NEGATIVE 11/17/2014 0143   UROBILINOGEN 0.2 11/17/2014 0143   UROBILINOGEN 0.2 09/12/2013 1542   NITRITE NEGATIVE 11/17/2014 0143   LEUKOCYTESUR NEGATIVE 11/17/2014 0143   LEUKOCYTESUR Negative 09/12/2013 1542   Results for DEREONNA, LENSING (MRN [id number]) as of 12/26/2015 17:12  Ref. Range 08/28/2015 11:53 09/24/2015 11:52 10/22/2015 11:49 11/19/2015 11:46 12/17/2015 11:30  CA 27.29 Latest Ref Range: 0.0 - 38.6 U/mL 1,679.3 (H) 1,571.0 (H) 1,748.2 (H) 1,572.2 (H) 1,754.1 (H)    STUDIES: Mr Pelvis W Wo Contrast  Result Date: 12/13/2015 CLINICAL DATA:  Metastatic breast cancer with pubic pain. EXAM: MRI PELVIS WITHOUT AND WITH CONTRAST TECHNIQUE: Multiplanar multisequence MR imaging of the pelvis was performed both before and after administration of intravenous contrast. CONTRAST:  15m MULTIHANCE GADOBENATE DIMEGLUMINE 529 MG/ML IV SOLN COMPARISON:  Radiographs 11/29/2015 and head CT 11/26/2015 FINDINGS: There are multiple enhancing metastatic bone lesions involving the pelvis. These involve the seat chrome, the iliac bones and the right ischium. There are also small lesions involving the pubic bones on the right side and a large lesion involving the right inferior pubic ramus. This is associated with a pathologic fracture with surrounding edema in the right operator muscles. This is likely the source of the patient's pelvic pain. No hip lesions or hip fracture. No AVN. No significant intrapelvic abnormalities are demonstrated. No intrapelvic mass or adenopathy. No inguinal mass or adenopathy. No muscle metastasis. IMPRESSION: Numerous enhancing metastatic bone lesions involving the pelvis. The most significant involvement is in the inferior pubic ramus on the right side. There is a nondisplaced pathologic fracture likely accounting for the patient's pain. There is associated inflammation/ edema in the right obturator muscles. No significant intrapelvic abnormalities. Electronically Signed   By: PMarijo SanesM.D.   On: 12/13/2015 08:12   Dg Pelvis Comp Min 3v  Result Date: 11/29/2015 CLINICAL DATA:  History of metastatic breast cancer. Right-sided pelvic pain. EXAM: [facility] PELVIS - 3+ VIEW COMPARISON:  PET-CT 11/26/2015 FINDINGS: Mixed lytic and sclerotic area involving the right inferior pubic ramus consistent with metastatic disease. No pathologic fracture is identified. This is stable when compared to the prior CT scan. No hip or other definite pelvic lesions are identified.  The pubic symphysis and SI joints are intact. IMPRESSION: Mixed lytic  and sclerotic area involving the right inferior pubic ramus consistent with known metastatic disease. No pathologic fracture. No other definite pelvic bone lesions. Electronically Signed   By: Marijo Sanes M.D.   On: 11/29/2015 15:06   Nm Pet Image Restag (ps) Skull Base To Thigh  Result Date: 11/26/2015 CLINICAL DATA:  Subsequent treatment strategy for restaging of metastatic breast cancer. Right breast primary. Metastatic disease to mediastinal nodes, left pleural space, retroperitoneum, liver, and bones. Re-evaluate. EXAM: NUCLEAR MEDICINE PET SKULL BASE TO THIGH TECHNIQUE: 8.9 mCi F-18 FDG was injected intravenously. Full-ring PET imaging was performed from the skull base to thigh after the radiotracer. CT data was obtained and used for attenuation correction and anatomic localization. FASTING BLOOD GLUCOSE:  Value: 93 mg/dl COMPARISON:  Prior PET 09/05/2015.  Abdominal MRI of 3 days ago. FINDINGS: NECK No areas of abnormal hypermetabolism. CHEST Left axillary node measures 7 mm and a S.U.V. max of 2.4 on image 83/ series 4. Compare 11 mm and a S.U.V. max of 6.5 previously. A prevascular node measures 9 mm and a S.U.V. max of 5.2 on image 83/series 4. Compare 6 mm and a S.U.V. max of 5.0 on the prior. Pleural-based left lower lobe lung hypermetabolism is less well-defined today. Measures a S.U.V. max of 4.5, including on image 104/series 4. On the prior, this measures a S.U.V. max of 7.8. Left anterior medial pleural hypermetabolism measures a S.U.V. max of 6.3 today versus a S.U.V. max of 8.2 on the prior exam (when remeasured). A left-sided mediastinal node measures 8 mm and a S.U.V. max of 4.4 on image 85/ series 4. Compare 9 mm and a S.U.V. max of 6.6 on the prior. ABDOMEN/PELVIS Multifocal hepatic hypermetabolism. The largest area of hypermetabolism is within the anterior portion right hepatic lobe. This is somewhat less well-defined  today, measuring 4.1 cm and a S.U.V. max of 7.4 on image 117/series 4. Compare 4.2 cm and a S.U.V. max of 7.9 on the prior. Caudate lobe focus of hypermetabolism measures a S.U.V. max of 5.5 today versus a S.U.V. max of 8.3 on the prior. Central right hepatic lobe metastasis measures 1.9 cm and a S.U.V. max of 5.2 today versus 1.9 cm and a S.U.V. max of 7.3 on the prior. The left periaortic retroperitoneal node measures 10 mm and a S.U.V. max of 4.5 on image 130/series 4. Compare 11 mm and a S.U.V. max of 6.5 on the prior PET. A portal caval node measures 9 mm and a S.U.V. max of 3.8 on image 126/series 4. Compare 12 mm and a S.U.V. max of 7.7 on the prior PET. SKELETON Hypermetabolic focus within the right posterior portion of the T11 vertebral body measures a S.U.V. max of 11.5 today versus a S.U.V. max of 7.8 on the prior. Low-level hypermetabolism about the right inferior pubic ramus measures a S.U.V. max of 6.8 today versus a S.U.V. max of 3.8 previously. CT IMAGES PERFORMED FOR ATTENUATION CORRECTION Lytic lesions in the skull, including a left parietal 19 mm lesion, similar. No cervical adenopathy. Right axillary node dissection. Lad coronary artery atherosclerosis, including on image 92/series 4. 6 mm nodule in the right upper or right middle lobe is similar including on image 85/series 4. Radiation fibrosis in the left apex. Rounded atelectasis in the left lower lobe. Circumaortic left renal vein. Abdominal aortic atherosclerosis. Mild pelvic floor laxity. IMPRESSION: 1. Mild metabolic response to therapy within the chest. 2. Mild metabolic response to therapy within the liver and abdominal nodal stations. 3. Increased  hypermetabolism corresponding to osseous metastasis, including a dominant right-sided T11 lesion. 4. No new sites of disease. 5.  Coronary artery atherosclerosis. Aortic atherosclerosis. Electronically Signed   By: Abigail Miyamoto M.D.   On: 11/26/2015 15:15    ASSESSMENT: 55 y.o. BRCA  negative [place] woman with stage IV breast cancer, history as follows  (1)  S/p Right upper inner quadrant lumpectomy and sentinel lymph node sampling 03/15/2004 for a pT1c pN0. Stage IA invasive ductal carcinoma, grade 2, estrogen receptor 95% positive, progesterone receptor 65% positive, HER-2 not amplified; additional surgery 04/25/2004 for seroma or clearance showed no residual tumor  (2) adjuvant chemotherapy with cyclophosphamide and doxorubicin every 21 days x4 completed 07/19/2004  (3) adjuvant radiation given under Dr. Donella Stade in [place] completed July 2006  (4) the patient opted against adjuvant antiestrogen therapy  (5) genetics testing showed no BRCA mutations  (6) biopsy of a palpable right axillary mass 10/24/2009 showed invasive ductal carcinoma, grade 3, estrogen receptor 100% positive, progesterone receptor 2% positive (alert score 5) HER-2 negative; no evidence of systemic disease on PET scanning  (7) completed 3 of 4 planned cycles of docetaxel and cyclophosphamide September 2011, fourth cycle omitted because of marked elevations in liver function tests  (8) an right axillary lymph node dissection 03/06/2010 showed 3/8 lymph nodes removed to be involved by tumor, with extracapsular extension.  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, TTS-1 negative), estrogen receptor 80% positive, progesterone receptor 2% positive, HER-2 not amplified  (12) letrozole started March 2015-- tolerated with significant side effects, discontinued at the end of May 2015  (13) PET scan  08/16/2013 shows extensive left pleural metastatic disease and a large left pleural effusion that shifts cardiac and mediastinal structures to the right; adenopathy (celiac trunk, periadrenal, periaortic); and a left medial clavicular lesion; Status post left thoracentesis 08/16/2013 positive for adenocarcinoma, estrogen receptor positive, progesterone receptor negative.  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 mutation; everolimus was suggested as an available targeted agent  (18) exemestane started 03/31/2014, discontinued 10/31/2014 with evidence of progression  (a) everolimus added 04/03/2014 but not tolerated (cytopenias, elevated LFTs) even at minimal doses; stopped 04/17/2014  (19) fulvestrant started 12/20/2014  (a) palbociclib added at very low dose 04/03/2015 (starting dose 75 mg weekly)  (b) palbociclib dose gradually increased to 75 mg daily, 21/7, as of May 2017  (20) liver biopsy 03/20/2015 confirms metastatic carcinoma, still estrogen receptor positive at 100%, progesterone receptor negative, HER-2 equivocal with a signals ratio 1.41, number per cell 4.50.   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 treated with Lovenox for 5-6 months  (c) right upper extremity lymphedema--receiving physical therapy  (d) hepatic steatosis with chronically elevated LFTs as well as unusual hepatic sensitivity to chemotherapy  (e) osteopenia with the lowest T score -1.6 on bone density scan 06/20/2013  (i) on denosumab/ Xgeva Q28d  (f) radiation oncology (Dr Valere Dross) has reviewed prior radiation records in case there is further mediastinal involvement with dysphagia etc in which case palliative XRT could be considered  (a) radiation to left mediastinum/ left 7th rib 3250 cGy in 13 sessions04/18/2016 through 08/02/2014  (b) radiation to T11 area: 22 Gy in 7 sessions, last dose 11/27/2014  (c) radiation left parietal scalp region to be completed 12/22/2014  (d) radiation to sacral area completed 04/09/2015  (e) radiation to pelvis and left ninth rib in process   (g) chest wall and perineal pain--improved post radiation treatments  (a) discussed celebrex/ carafate but holding off for now  (h) zoster diagnosed 04/04/2015-- on valacyclovir   PLAN: [place] is tolerating her current treatments well and we are continuing the fulvestrant and Xgeva/denosumab for now. We are keeping the bulb palbociclib at the current dose.  Because of the radiation she is receiving her counts may drop and we may need to hold a polyp palbociclib. She is aware of this. She is getting lab work every Monday.  She is going to be restaged with MRIs of the liver and spine November 16, and a PET scan December 19. I think our next move a scan to be to go to pembrolizumab and unfortunately this was denied by her insurance. We're going to try to obtain it from the company.  She has some requests  regarding radiology reviews and I have passed these on to Dr. Kathrynn Humble.  She is also greatly benefiting from continuing physical therapy  She will see me again October 24. She knows to call for any problems that may develop before that visit.   Chauncey Cruel, MD   12/26/2015 6:44 AM

## 2015-12-27 ENCOUNTER — Telehealth: Payer: Self-pay

## 2015-12-27 ENCOUNTER — Ambulatory Visit
Admission: RE | Admit: 2015-12-27 | Discharge: 2015-12-27 | Disposition: A | Discharge status: Home / Self Care | Payer: 59 | Source: Ambulatory Visit | Attending: Radiation Oncology | Admitting: Radiation Oncology

## 2015-12-27 ENCOUNTER — Telehealth: Payer: Self-pay | Admitting: *Deleted

## 2015-12-27 DIAGNOSIS — Z510 Encounter for antineoplastic radiation therapy: Secondary | ICD-10-CM | POA: Diagnosis not present

## 2015-12-27 NOTE — Telephone Encounter (Signed)
This is being handled by [place] in medical records - I will be forwarding this information

## 2015-12-27 NOTE — Telephone Encounter (Signed)
Adele from [employer] called asking if we had submitted Keytruda to [facility] and received denial. This needs to be done for [facility] to move forward in processing pt assistance. No note seen in pt chart stating [employer] denied [place]. I told Adele the pt had already received a denial letter from [place] [facility]. (See telephone note from 9/12 for this conversation). Adele will contact [employer] to confirm the denial. Gaspar Bidding will be contacted to see if we have a denial letter from [place] [facility]. Adele's number is [phone]

## 2015-12-27 NOTE — Telephone Encounter (Signed)
This RN spoke with pt per her call stating concerns relating to current radiation therapy and addendum reading of MRI.  " I am concerned because this new reading states everything is stable but I am having radiation because of pain but where then is the source of pain ?"  " I need to know that I should proceed with the 4 more radiation treatments from my team - I really rely on them to tell me what is best for me "  " am I going to be rewarded ( less pain ) with my choice to proceed with radiation ?"  This RN validated pt's concerns as well as informing her that Dr Jannifer Rodney is out of the office until Monday.  [name] [name] is wanting to see Dr Lisbeth Renshaw prior to radiation therapy tomorrow and has contacted radiation techs.  Informed [name] [name] this note will be sent to Dr Lisbeth Renshaw for communication as well.

## 2015-12-28 ENCOUNTER — Ambulatory Visit
Admission: RE | Admit: 2015-12-28 | Discharge: 2015-12-28 | Disposition: A | Discharge status: Home / Self Care | Payer: 59 | Source: Ambulatory Visit | Attending: Radiation Oncology | Admitting: Radiation Oncology

## 2015-12-28 ENCOUNTER — Encounter: Payer: Self-pay | Admitting: Oncology

## 2015-12-28 ENCOUNTER — Ambulatory Visit: Payer: 59 | Admitting: Physical Therapy

## 2015-12-28 DIAGNOSIS — C7951 Secondary malignant neoplasm of bone: Secondary | ICD-10-CM

## 2015-12-28 DIAGNOSIS — M79651 Pain in right thigh: Secondary | ICD-10-CM | POA: Diagnosis not present

## 2015-12-28 DIAGNOSIS — M62838 Other muscle spasm: Secondary | ICD-10-CM

## 2015-12-28 DIAGNOSIS — Z510 Encounter for antineoplastic radiation therapy: Secondary | ICD-10-CM | POA: Diagnosis not present

## 2015-12-28 DIAGNOSIS — I890 Lymphedema, not elsewhere classified: Secondary | ICD-10-CM

## 2015-12-28 NOTE — Progress Notes (Signed)
Md with patient in room 6 at nursing, discussing addendum on MRI , she did go to physical therapy today, no vitals taken ,she has had 4/8 rad txs so far left rib and rt inf pelvis  1:40 PM

## 2015-12-28 NOTE — Therapy (Signed)
[place], [place], [address] Phone: [phone]   Fax:  [phone]  Physical Therapy Treatment  Patient Details  Name: [name] [name] MRN: [id number] Date of Birth: Aug 03, 1960 Referring Provider: Dr. Lurline Del  Encounter Date: 12/28/2015      PT End of Session - 12/28/15 1214    Visit Number [id number] for lymph    Number of Visits 95  for lymph    Date for PT Re-Evaluation 03/03/16   PT Start Time (ACUTE ONLY) 1025   PT Stop Time (ACUTE ONLY) 1110   PT Time Calculation (min) (ACUTE ONLY) 45 min      Past Medical History:  Diagnosis Date  . Bone metastases ([place]) dx'd 05/2014  . Breast cancer ([place]) dx'd 2005/2011  . Peripheral vascular disease ([place]) 02/2010   blood clot related to porta cath  . PONV (postoperative nausea and vomiting)   . S/P radiation therapy 07/17/2014 through 08/02/2014    Left mediastinum, left seventh rib 3250 cGy in 13 sessions   . S/P radiation therapy 12/11/2014 through 12/22/2014    Left parietal calvarium 2400 cGy in 8 sessions   . Seizures ([place]) 2010   Isolated incident.    Past Surgical History:  Procedure Laterality Date  . AXILLARY LYMPH NODE DISSECTION  Dec. 2011  . BREAST LUMPECTOMY  2005  . MEDIASTINOTOMY CHAMBERLAIN MCNEIL Left 06/02/2013   Procedure: MEDIASTINOTOMY CHAMBERLAIN MCNEIL;  Surgeon: [name], MD;  Location: [facility];  Service: Thoracic;  Laterality: Left;  LEFT ANTERIOR MEDIASTINOTOMY   . PORTACATH PLACEMENT  12/11  . removal portacath      There were no vitals filed for this visit.      Subjective Assessment - 12/28/15 1120    Subjective Pt has a meeting today to discuss radiation for her pelvic lesion.   She is getting radiation for her ribs also    Currently in Pain?  Yes   Pain Score 6    Pain Location Axilla                         [facility] Adult PT Treatment/Exercise - 12/28/15 0001      Manual Therapy   Myofascial Release right axilla with focus on scar tightness   Manual Lymphatic Drainage (MLD) In left sidelying, posterior interaxillary anastomosis and right axillo-inguinal anastomosis; in supine, short neck, left axilla and anterior interaxillary anastomosis, right groin and axillo-inguinal anastomosis, and right upper extremity from fingers to shoulder.  In right sidelying, left periscapular area toward left groin.   Other Manual Therapy soft tissue work at superior aspect of right medial thigh in prone position for pain relief and muscle relaxation, gentle myofascial release at upper thigh                  PT Short Term Goals - 02/27/15 1011      PT SHORT TERM GOAL #1   Title pain with walking decreased >/= 25%   Time 3   Period Weeks   Status Achieved     PT SHORT TERM GOAL #2   Title pelvis stays in correct alignment for 3 weeks   Time 3   Period Weeks   Status Achieved           PT Long Term Goals - 08/14/15 1317      PT LONG TERM GOAL #1   Title indpendent with HEP   Time  12   Period Weeks   Status On-going     PT LONG TERM GOAL #2   Title pain with walking decreased >/= 80%   Time 12   Period Weeks   Status On-going     PT LONG TERM GOAL #3   Title go up and down steps with a step over step pattern   Time 6   Status Deferred     PT LONG TERM GOAL #4   Title being able to last for 5 days without therapy and pain not going above a 8/10 due to decreased muscle soreness   Time 12   Period Weeks   Status Achieved           Long Term Clinic Goals - 12/24/15 1300      CC Long Term Goal  #2   Title Pt. will report swelling is adequately managed to enable ADL function at a consistent level.   Status On-going     CC Long Term Goal  #4   Title Pain/discomfort at right axilla area will  be controlled at 6/10 or less.   Status On-going     CC Long Term Goal  #5   Title Patient will avoid infection by ongoing management of her lymphedema at right breast/axilla/upper arm areas.   Status On-going     CC Long Term Goal  #6   Title Pain in right groin/ischial tuberosity area will be controlled at 3/10 level or less to enable walking with less pain.   Status On-going            Plan - 12/28/15 1215    Clinical Impression Statement Pt continues to have tightness in right posterior upper thigh that is improved with soft tissue work.  Seh continue to have some fullness in right hand  She is upset about questions about radiation treatment and is concerned that she might be at [facility] for another pubic bone fracture    Rehab Potential Good   Clinical Impairments Affecting Rehab Potential active cancer   PT Next Visit Plan Continue manual lymph drainage for right upper outer breast and for right UE; also for left rib pain; continue right thigh soft tissue work focused on medial right thigh with myofascial release   PT Plan --      Patient will benefit from skilled therapeutic intervention in order to improve the following deficits and impairments:  Pain, Increased muscle spasms, Difficulty walking, Increased edema  Visit Diagnosis: Lymphedema, not elsewhere classified  Pain in right thigh  Other muscle spasm     Problem List Patient Active Problem List   Diagnosis Date Noted  . 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 Zoster 04/04/2015  . Nausea with vomiting 11/18/2014  . Constipation 11/18/2014  . Left-sided thoracic back pain   . Bone metastases ([place]) 11/16/2014  . Back pain 11/15/2014  . Uncontrolled pain 11/14/2014  . 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 Chest wall pain 03/21/2014  . Abnormal LFTs (liver function tests) 09/12/2013  . Breast cancer of upper-inner quadrant of right female breast ([place]) 08/18/2013  . 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 Owens Shark PT  [name] 12/28/2015, 12:19 PM  [place], [place], [address] Phone: [phone]   Fax:  [phone]  Name: [name] [name] MRN: [id number] Date of Birth: Sep 19, 1960

## 2015-12-28 NOTE — Progress Notes (Signed)
Rcvd approval letter from [employer] for pt to receive free Keytruda from 12/28/15 to 12/27/16.  They are awaiting a prescription and a start date.  I have emailed request to [employer] and Val.

## 2015-12-30 NOTE — Progress Notes (Signed)
Department of Radiation Oncology  Phone:  [phone] Fax:        [phone]  Weekly Treatment Note    Name: [name] [name] Date: 12/30/2015 MRN: [id number] DOB: 1960-08-25   Diagnosis:     ICD-9-CM ICD-10-CM   1. Bone metastases (HCC) 198.5 C79.51      Current dose: 15 Gy  Current fraction: 5   MEDICATIONS: Current Outpatient Prescriptions  Medication Sig Dispense Refill  . ALPRAZolam (XANAX) 0.5 MG tablet Take 1 tablet (0.5 mg total) by mouth 2 (two) times daily as needed for anxiety. 30 tablet 0  . B Complex-C (B-COMPLEX WITH VITAMIN C) tablet Take 1 tablet by mouth daily. Reported on 03/27/2015    . calcium carbonate (TUMS - DOSED IN MG ELEMENTAL CALCIUM) 500 MG chewable tablet Chew 1 tablet by mouth as directed.    . cholecalciferol 2000 UNITS tablet Take 1 tablet (2,000 Units total) by mouth daily.    . Diphenhyd-Hydrocort-Nystatin (FIRST-DUKES MOUTHWASH) SUSP 5-10 ml qid SWISH AND SPIT A999333 mL 3  . folic acid (FOLVITE) 1 MG tablet Take 1 tablet (1 mg total) by mouth daily.    Marland Kitchen loratadine-pseudoephedrine (CLARITIN-D 24-HOUR) 10-240 MG 24 hr tablet Take 1 tablet by mouth daily.    . Melatonin 3 MG TABS Take 3 mg by mouth at bedtime.    . naproxen sodium (ANAPROX) 220 MG tablet Take 220 mg by mouth 2 (two) times daily with a meal.    . palbociclib (IBRANCE) 75 MG capsule Take 1 capsule (75 mg total) by mouth daily with breakfast. Take whole with food. 21 capsule 6  . RABEprazole Sodium 5 MG CPSP Take 5 mg by mouth daily. 30 capsule 3  . saccharomyces boulardii (FLORASTOR) 250 MG capsule Take 250 mg by mouth daily.      No current facility-administered medications for this encounter.      ALLERGIES: 2nd skin quick heal; Decadron [dexamethasone]; Dilaudid [hydromorphone]; Enoxaparin; Fluconazole; Hydromorphone hcl; Morphine and related; Protonix [pantoprazole sodium]; and Tegaderm ag mesh [silver]   LABORATORY DATA:  Lab Results  Component Value Date   WBC 3.2 (L) 12/24/2015   HGB 11.8 12/24/2015   HCT 32.6 (L) 12/24/2015   MCV 101.6 (H) 12/24/2015   PLT 195 12/24/2015   Lab Results  Component Value Date   NA 139 12/17/2015   K 4.3 12/17/2015   CL 103 12/14/2014   CO2 28 12/17/2015   Lab Results  Component Value Date   ALT 33 12/17/2015   AST 28 12/17/2015   ALKPHOS 73 12/17/2015   BILITOT 0.31 12/17/2015     NARRATIVE: [name] [name] was seen today for weekly treatment management. The chart was checked and the patient's films were reviewed.  Md with patient in room 6 at nursing, discussing addendum on MRI , she did go to physical therapy today, no vitals taken ,she has had 4/8 rad txs so far left rib and rt inf pelvis   PHYSICAL EXAMINATION:   Alert, no acute distress  ASSESSMENT: The patient is doing satisfactorily with treatment.  We discussed the patient's treatment, especially with respect to the right pelvis. We discussed the possible benefit of treatment and we also reviewed in detail her recent imaging. It was felt that the fracture may have healed more than we had previously discussed based on additional information. However tumor is present in the area of treatment it appears and this area did increase in terms of the PET scan activity. Abdomen minimum, I believe  that this area puts the bone at this site at risk going forward without further treatment in terms of radiation. We also did discuss the negative in terms of the direct effect of radiation treatment on the bone. On balance, I believe that the benefits of treatment outweigh the risks and the patient indicated that she would like to proceed with the current treatment.  PLAN: We will continue with the patient's radiation treatment as planned.

## 2015-12-31 ENCOUNTER — Other Ambulatory Visit (HOSPITAL_BASED_OUTPATIENT_CLINIC_OR_DEPARTMENT_OTHER): Payer: 59

## 2015-12-31 ENCOUNTER — Ambulatory Visit
Admission: RE | Admit: 2015-12-31 | Discharge: 2015-12-31 | Disposition: A | Discharge status: Home / Self Care | Payer: 59 | Source: Ambulatory Visit | Attending: Radiation Oncology | Admitting: Radiation Oncology

## 2015-12-31 ENCOUNTER — Ambulatory Visit: Payer: 59 | Attending: Oncology | Admitting: Physical Therapy

## 2015-12-31 DIAGNOSIS — M79651 Pain in right thigh: Secondary | ICD-10-CM | POA: Diagnosis present

## 2015-12-31 DIAGNOSIS — M62838 Other muscle spasm: Secondary | ICD-10-CM | POA: Diagnosis present

## 2015-12-31 DIAGNOSIS — Z510 Encounter for antineoplastic radiation therapy: Secondary | ICD-10-CM | POA: Diagnosis not present

## 2015-12-31 DIAGNOSIS — C50211 Malignant neoplasm of upper-inner quadrant of right female breast: Secondary | ICD-10-CM

## 2015-12-31 DIAGNOSIS — C771 Secondary and unspecified malignant neoplasm of intrathoracic lymph nodes: Secondary | ICD-10-CM

## 2015-12-31 DIAGNOSIS — I890 Lymphedema, not elsewhere classified: Secondary | ICD-10-CM | POA: Diagnosis not present

## 2015-12-31 LAB — CBC WITH DIFFERENTIAL/PLATELET
BASO%: 2.3 % — ABNORMAL HIGH (ref 0.0–2.0)
Basophils Absolute: 0.1 10*3/uL (ref 0.0–0.1)
EOS%: 2.3 % (ref 0.0–7.0)
Eosinophils Absolute: 0.1 10*3/uL (ref 0.0–0.5)
HCT: 33.1 % — ABNORMAL LOW (ref 34.8–46.6)
HGB: 11.8 g/dL (ref 11.6–15.9)
LYMPH%: 14.9 % (ref 14.0–49.7)
MCH: 36.9 pg — ABNORMAL HIGH (ref 25.1–34.0)
MCHC: 35.6 g/dL (ref 31.5–36.0)
MCV: 103.4 fL — ABNORMAL HIGH (ref 79.5–101.0)
MONO#: 0.5 10*3/uL (ref 0.1–0.9)
MONO%: 16.5 % — AB (ref 0.0–14.0)
NEUT%: 64 % (ref 38.4–76.8)
NEUTROS ABS: 1.9 10*3/uL (ref 1.5–6.5)
PLATELETS: 230 10*3/uL (ref 145–400)
RBC: 3.2 10*6/uL — AB (ref 3.70–5.45)
RDW: 13.2 % (ref 11.2–14.5)
WBC: 3 10*3/uL — AB (ref 3.9–10.3)
lymph#: 0.5 10*3/uL — ABNORMAL LOW (ref 0.9–3.3)
nRBC: 0 % (ref 0–0)

## 2015-12-31 NOTE — Therapy (Signed)
[place], [place], [address] Phone: [phone]   Fax:  [phone]  Physical Therapy Treatment  Patient Details  Name: [name] [name] MRN: [id number] Date of Birth: [birth year] Referring Provider: Dr. Lurline Del  Encounter Date: 12/31/2015      PT End of Session - 12/31/15 1507    Visit Number [id number] for lymph   Number of Visits 95  for lymph   Date for PT Re-Evaluation 03/03/16   Authorization - Visit Number 140   PT Start Time 1020   PT Stop Time 1100   PT Time Calculation (min) 40 min   Activity Tolerance Patient tolerated treatment well   Behavior During Therapy [facility] for tasks assessed/performed      Past Medical History:  Diagnosis Date  . Bone metastases ([place]) dx'd 05/2014  . Breast cancer ([place]) dx'd 2005/2011  . Peripheral vascular disease ([place]) 02/2010   blood clot related to porta cath  . PONV (postoperative nausea and vomiting)   . S/P radiation therapy 07/17/2014 through 08/02/2014    Left mediastinum, left seventh rib 3250 cGy in 13 sessions   . S/P radiation therapy 12/11/2014 through 12/22/2014    Left parietal calvarium 2400 cGy in 8 sessions   . Seizures ([place]) 2010   Isolated incident.    Past Surgical History:  Procedure Laterality Date  . AXILLARY LYMPH NODE DISSECTION  Dec. 2011  . BREAST LUMPECTOMY  2005  . MEDIASTINOTOMY CHAMBERLAIN MCNEIL Left 06/02/2013   Procedure: MEDIASTINOTOMY CHAMBERLAIN MCNEIL;  Surgeon: [name], MD;  Location: [place];  Service: Thoracic;  Laterality: Left;  LEFT ANTERIOR MEDIASTINOTOMY   . PORTACATH PLACEMENT  12/11  . removal portacath      There were no vitals filed for this visit.      Subjective Assessment - 12/31/15 1024    Currently in Pain? Yes   Pain  Score 5    Pain Location Axilla   Pain Orientation Right   Pain Score 2   Pain Location Groin   Pain Orientation Right   Pain Score --  up to 10   Pain Location Rib cage   Pain Orientation Left                         [facility] Adult PT Treatment/Exercise - 12/31/15 0001      Manual Therapy   Myofascial Release right axilla with focus on scar tightness   Manual Lymphatic Drainage (MLD) In left sidelying, posterior interaxillary anastomosis and right axillo-inguinal anastomosis; in supine, short neck, left axilla and anterior interaxillary anastomosis, right groin and axillo-inguinal anastomosis, and right upper extremity from fingers to shoulder.  In right sidelying, left periscapular area toward left groin.   Other Manual Therapy soft tissue work at superior aspect of right medial thigh in prone position for pain relief and muscle relaxation, gentle myofascial release at upper thigh                  PT Short Term Goals - 02/27/15 1011      PT SHORT TERM GOAL #1   Title pain with walking decreased >/= 25%   Time 3   Period Weeks   Status Achieved     PT SHORT TERM GOAL #2   Title pelvis stays in correct alignment for 3 weeks   Time 3   Period Weeks   Status Achieved  PT Long Term Goals - 08/14/15 1317      PT LONG TERM GOAL #1   Title indpendent with HEP   Time 12   Period Weeks   Status On-going     PT LONG TERM GOAL #2   Title pain with walking decreased >/= 80%   Time 12   Period Weeks   Status On-going     PT LONG TERM GOAL #3   Title go up and down steps with a step over step pattern   Time 6   Status Deferred     PT LONG TERM GOAL #4   Title being able to last for 5 days without therapy and pain not going above a 8/10 due to decreased muscle soreness   Time 12   Period Weeks   Status Achieved           Long Term Clinic Goals - 12/24/15 1300      CC Long Term Goal  #2   Title Pt. will report swelling is  adequately managed to enable ADL function at a consistent level.   Status On-going     CC Long Term Goal  #4   Title Pain/discomfort at right axilla area will be controlled at 6/10 or less.   Status On-going     CC Long Term Goal  #5   Title Patient will avoid infection by ongoing management of her lymphedema at right breast/axilla/upper arm areas.   Status On-going     CC Long Term Goal  #6   Title Pain in right groin/ischial tuberosity area will be controlled at 3/10 level or less to enable walking with less pain.   Status On-going            Plan - 12/31/15 1507    Clinical Impression Statement Right upper outer breast tissue less indurated than on previous visits.  Pt. seems to be having a little less pain at right thigh today, but has been walking less.  She is more comfortable with her decision to do radiation, 8 treatments.   Rehab Potential Good   Clinical Impairments Affecting Rehab Potential active cancer   PT Frequency 2x / week   PT Duration 12 weeks   PT Treatment/Interventions Manual techniques;Manual lymph drainage   PT Next Visit Plan Continue manual lymph drainage for right upper outer breast and for right UE; also for left rib pain; continue right thigh soft tissue work focused on medial right thigh with myofascial release   Consulted and Agree with Plan of Care Patient   PT Plan Continue manual lymph drainage, soft tissue work, myofascial release.  Patient was to wear sports bra to see if that is adequate compression for her.      Patient will benefit from skilled therapeutic intervention in order to improve the following deficits and impairments:  Pain, Increased muscle spasms, Difficulty walking, Increased edema  Visit Diagnosis: Lymphedema, not elsewhere classified  Pain in right thigh     Problem List Patient Active Problem List   Diagnosis Date Noted  . 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 Zoster 04/04/2015  . Nausea with vomiting  11/18/2014  . Constipation 11/18/2014  . Left-sided thoracic back pain   . Bone metastases ([place]) 11/16/2014  . Back pain 11/15/2014  . Uncontrolled pain 11/14/2014  . 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 Chest wall pain 03/21/2014  . Abnormal LFTs (liver function tests) 09/12/2013  . Breast cancer of upper-inner quadrant of right female breast (  [place]) 08/18/2013  . 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

## 2016-01-01 ENCOUNTER — Ambulatory Visit
Admission: RE | Admit: 2016-01-01 | Discharge: 2016-01-01 | Disposition: A | Discharge status: Home / Self Care | Payer: 59 | Source: Ambulatory Visit | Attending: Radiation Oncology | Admitting: Radiation Oncology

## 2016-01-01 DIAGNOSIS — Z510 Encounter for antineoplastic radiation therapy: Secondary | ICD-10-CM | POA: Diagnosis not present

## 2016-01-02 ENCOUNTER — Encounter: Payer: Self-pay | Admitting: Radiation Oncology

## 2016-01-02 ENCOUNTER — Ambulatory Visit (HOSPITAL_BASED_OUTPATIENT_CLINIC_OR_DEPARTMENT_OTHER): Payer: 59

## 2016-01-02 ENCOUNTER — Other Ambulatory Visit: Payer: Self-pay | Admitting: *Deleted

## 2016-01-02 ENCOUNTER — Other Ambulatory Visit: Payer: Self-pay | Admitting: Oncology

## 2016-01-02 ENCOUNTER — Ambulatory Visit
Admission: RE | Admit: 2016-01-02 | Discharge: 2016-01-02 | Disposition: A | Discharge status: Home / Self Care | Payer: 59 | Source: Ambulatory Visit | Attending: Radiation Oncology | Admitting: Radiation Oncology

## 2016-01-02 VITALS — BP 123/90 | HR 87 | Ht 64.0 in

## 2016-01-02 DIAGNOSIS — Z510 Encounter for antineoplastic radiation therapy: Secondary | ICD-10-CM | POA: Diagnosis not present

## 2016-01-02 DIAGNOSIS — N3000 Acute cystitis without hematuria: Secondary | ICD-10-CM

## 2016-01-02 DIAGNOSIS — C7951 Secondary malignant neoplasm of bone: Secondary | ICD-10-CM

## 2016-01-02 LAB — URINALYSIS, MICROSCOPIC - CHCC
BILIRUBIN (URINE): NEGATIVE
Glucose: NEGATIVE mg/dL
KETONES: NEGATIVE mg/dL
NITRITE: NEGATIVE
PH: 7 (ref 4.6–8.0)
Protein: NEGATIVE mg/dL
Specific Gravity, Urine: 1.005 (ref 1.003–1.035)
Urobilinogen, UR: 0.2 mg/dL (ref 0.2–1)

## 2016-01-02 MED ORDER — CIPROFLOXACIN HCL 500 MG PO TABS: 500.0000 mg | ORAL_TABLET | Freq: Two times a day (BID) | ORAL | 0 refills | Status: DC

## 2016-01-02 MED ORDER — PHENAZOPYRIDINE HCL 200 MG PO TABS
200.0000 mg | ORAL_TABLET | Freq: Three times a day (TID) | ORAL | 1 refills | Status: DC | PRN
Start: 2016-01-02 — End: 2016-07-25

## 2016-01-02 NOTE — Progress Notes (Addendum)
Ms. [name] [name] has received 8 fractions to her Left Rib and right inferior pelvis. Reports pain left lateral back and level 3-4/10 pain when urinating at this time and is worried that she has a UTI. Has Trace leucocyte Esterase and urine is cloudy per today's lab report.  Refused to have temperature checked.  Requesting antibiotics - [facility]

## 2016-01-04 ENCOUNTER — Other Ambulatory Visit: Payer: Self-pay | Admitting: Radiation Oncology

## 2016-01-04 ENCOUNTER — Ambulatory Visit: Payer: 59 | Admitting: Physical Therapy

## 2016-01-04 ENCOUNTER — Telehealth: Payer: Self-pay | Admitting: *Deleted

## 2016-01-04 DIAGNOSIS — C7951 Secondary malignant neoplasm of bone: Secondary | ICD-10-CM

## 2016-01-04 DIAGNOSIS — I890 Lymphedema, not elsewhere classified: Secondary | ICD-10-CM

## 2016-01-04 DIAGNOSIS — M79651 Pain in right thigh: Secondary | ICD-10-CM

## 2016-01-04 LAB — URINE CULTURE

## 2016-01-04 NOTE — Telephone Encounter (Signed)
[name] [name] called from Physical therapy stating " patient called and asked if they had gotten referral from Dr. Lisbeth Renshaw for her for physical therapy, should be a   New order", I checked MD nlast note and nothing as yet I will ask MD or our PA and call [name] back as soon as I find out status, [name] phone number=[phone] 8:45 AM

## 2016-01-04 NOTE — Progress Notes (Signed)
Department of Radiation Oncology  Phone:  [phone] Fax:        [phone]  Weekly Treatment Note    Name: [name] [name] Date: 01/04/2016 MRN: [id number] DOB: 05-20-1960   Diagnosis:     ICD-9-CM ICD-10-CM   1. Bone metastases (HCC) 198.5 C79.51      Current dose: 24 Gy (left lateral rib)  Current fraction:8  MEDICATIONS: Current Outpatient Prescriptions  Medication Sig Dispense Refill  . ALPRAZolam (XANAX) 0.5 MG tablet Take 1 tablet (0.5 mg total) by mouth 2 (two) times daily as needed for anxiety. 30 tablet 0  . B Complex-C (B-COMPLEX WITH VITAMIN C) tablet Take 1 tablet by mouth daily. Reported on 03/27/2015    . calcium carbonate (TUMS - DOSED IN MG ELEMENTAL CALCIUM) 500 MG chewable tablet Chew 1 tablet by mouth as directed.    . cholecalciferol 2000 UNITS tablet Take 1 tablet (2,000 Units total) by mouth daily.    . ciprofloxacin (CIPRO) 500 MG tablet Take 1 tablet (500 mg total) by mouth 2 (two) times daily. 6 tablet 0  . Diphenhyd-Hydrocort-Nystatin (FIRST-DUKES MOUTHWASH) SUSP 5-10 ml qid SWISH AND SPIT A999333 mL 3  . folic acid (FOLVITE) 1 MG tablet Take 1 tablet (1 mg total) by mouth daily.    Marland Kitchen loratadine-pseudoephedrine (CLARITIN-D 24-HOUR) 10-240 MG 24 hr tablet Take 1 tablet by mouth daily.    . Melatonin 3 MG TABS Take 3 mg by mouth at bedtime.    . naproxen sodium (ANAPROX) 220 MG tablet Take 220 mg by mouth 2 (two) times daily with a meal.    . palbociclib (IBRANCE) 75 MG capsule Take 1 capsule (75 mg total) by mouth daily with breakfast. Take whole with food. 21 capsule 6  . phenazopyridine (PYRIDIUM) 200 MG tablet Take 1 tablet (200 mg total) by mouth 3 (three) times daily as needed for pain. 30 tablet 1  . RABEprazole Sodium 5 MG CPSP Take 5 mg by mouth daily. 30 capsule 3  . saccharomyces boulardii (FLORASTOR) 250 MG capsule Take 250 mg by mouth daily.      No current facility-administered medications for this encounter.      ALLERGIES:  2nd skin quick heal; Decadron [dexamethasone]; Dilaudid [hydromorphone]; Enoxaparin; Fluconazole; Hydromorphone hcl; Morphine and related; Protonix [pantoprazole sodium]; and Tegaderm ag mesh [silver]   LABORATORY DATA:  Lab Results  Component Value Date   WBC 3.0 (L) 12/31/2015   HGB 11.8 12/31/2015   HCT 33.1 (L) 12/31/2015   MCV 103.4 (H) 12/31/2015   PLT 230 12/31/2015   Lab Results  Component Value Date   NA 139 12/17/2015   K 4.3 12/17/2015   CL 103 12/14/2014   CO2 28 12/17/2015   Lab Results  Component Value Date   ALT 33 12/17/2015   AST 28 12/17/2015   ALKPHOS 73 12/17/2015   BILITOT 0.31 12/17/2015     NARRATIVE: [name] [name] was seen today for weekly treatment management. The chart was checked and the patient's films were reviewed.  [name] called from Physical therapy stating " patient called and asked if they had gotten referral from Dr. Lisbeth Renshaw for her for physical therapy, should be a   New order", I checked MD nlast note and nothing as yet I will ask MD or our PA and call [name] back as soon as I find out status, [name] phone [phone] 9:13 AM    PHYSICAL EXAMINATION: height is 5\' 4"  (1.626 m). Her blood pressure is 123/90 and her  pulse is 87. Her oxygen saturation is 100%.        ASSESSMENT: The patient is doing satisfactorily with treatment.  I discussed the patient's urinalysis with her. This on the initial result did not reveal an active urinary infection. The patient is having some dysuria and I discussed with her proceeding with per radium medication for this. She is concerned about developing an infection, especially on her current systemic treatment. She will further discuss possible prophylactic antibiotic with medical oncology.  The patient based on these symptoms declined the last fraction of radiation treatment to the right pelvis. Therefore, she received 21 gray in 7 fractions to the right pelvis with the current course of treatment  and 24 gray in 8 fractions to the left rib. She does not wish to proceed with any additional treatment as was originally discussed with the possibility of treating her to 30 gray.  PLAN:The patient will follow-up in our clinic in 1 month. Of note, we discussed the potential criteria for consideration of radiosurgery to the CT 11 vertebral body. On imaging, it is difficult to assess a clear change in size of the tumor in this location.the PET scan activity is substantially increased as compared to prior to her initial treatment to this location. With further anatomic change in this location or the development of significant symptoms related to this site, radiosurgery would be considered. Otherwise, we will continue to follow this. A referral has been placed for pelvic physical therapy given some of the patient's concern in terms of local reaction in this area. A thoracic MRI scan will also be ordered which hopefully can be completed in conjunction with her upcoming imaging studies later this year.

## 2016-01-04 NOTE — Therapy (Signed)
[place], [place], [address] Phone: [phone]   Fax:  [phone]  Physical Therapy Treatment  Patient Details  Name: [name] [name] MRN: [id number] Date of Birth: [birth year] Referring Provider: Dr. Lurline Del  Encounter Date: 01/04/2016      PT End of Session - 01/04/16 1210    Visit Number [id number] for lymph   Number of Visits 95  for lymph   Date for PT Re-Evaluation 03/03/16   Authorization - Visit Number 140   PT Start Time 1020   PT Stop Time 1102   PT Time Calculation (min) 42 min   Activity Tolerance Patient tolerated treatment well   Behavior During Therapy [facility] for tasks assessed/performed      Past Medical History:  Diagnosis Date  . Bone metastases ([place]) dx'd 05/2014  . Breast cancer ([place]) dx'd 2005/2011  . Peripheral vascular disease ([place]) 02/2010   blood clot related to porta cath  . PONV (postoperative nausea and vomiting)   . S/P radiation therapy 07/17/2014 through 08/02/2014    Left mediastinum, left seventh rib 3250 cGy in 13 sessions   . S/P radiation therapy 12/11/2014 through 12/22/2014    Left parietal calvarium 2400 cGy in 8 sessions   . Seizures ([place]) 2010   Isolated incident.    Past Surgical History:  Procedure Laterality Date  . AXILLARY LYMPH NODE DISSECTION  Dec. 2011  . BREAST LUMPECTOMY  2005  . MEDIASTINOTOMY CHAMBERLAIN MCNEIL Left 06/02/2013   Procedure: MEDIASTINOTOMY CHAMBERLAIN MCNEIL;  Surgeon: [name], MD;  Location: [facility];  Service: Thoracic;  Laterality: Left;  LEFT ANTERIOR MEDIASTINOTOMY   . PORTACATH PLACEMENT  12/11  . removal portacath      There were no vitals filed for this visit.      Subjective Assessment - 01/04/16 1020    Subjective "Let me tell you about  down here:  They burned me."  Reports burning with urination.   Currently in Pain? Yes   Pain Score 6    Pain Location Axilla   Pain Orientation Right   Pain Type Chronic pain   Pain Score 4   Pain Location Groin   Pain Orientation Right   Pain Descriptors / Indicators Tightness   Pain Type Acute pain                         [facility] Adult PT Treatment/Exercise - 01/04/16 0001      Manual Therapy   Myofascial Release right axilla with focus on scar tightness   Manual Lymphatic Drainage (MLD) In left sidelying, posterior interaxillary anastomosis and right axillo-inguinal anastomosis; in supine, short neck, left axilla and anterior interaxillary anastomosis, right groin and axillo-inguinal anastomosis, and right upper extremity from fingers to shoulder.  In right sidelying, left periscapular area toward left groin.   Other Manual Therapy soft tissue work at superior aspect of right medial and upper thigh in prone position for pain relief and muscle relaxation, gentle myofascial release at upper thigh                  PT Short Term Goals - 02/27/15 1011      PT SHORT TERM GOAL #1   Title pain with walking decreased >/= 25%   Time 3   Period Weeks   Status Achieved     PT SHORT TERM GOAL #2   Title pelvis stays in correct alignment for  3 weeks   Time 3   Period Weeks   Status Achieved           PT Long Term Goals - 08/14/15 1317      PT LONG TERM GOAL #1   Title indpendent with HEP   Time 12   Period Weeks   Status On-going     PT LONG TERM GOAL #2   Title pain with walking decreased >/= 80%   Time 12   Period Weeks   Status On-going     PT LONG TERM GOAL #3   Title go up and down steps with a step over step pattern   Time 6   Status Deferred     PT LONG TERM GOAL #4   Title being able to last for 5 days without therapy and pain not going above a 8/10 due to decreased muscle soreness   Time 12   Period Weeks   Status Achieved            Long Term Clinic Goals - 01/04/16 1213      CC Long Term Goal  #2   Title Pt. will report swelling is adequately managed to enable ADL function at a consistent level.   Status On-going     CC Long Term Goal  #4   Title Pain/discomfort at right axilla area will be controlled at 6/10 or less.   Status On-going     CC Long Term Goal  #5   Title Patient will avoid infection by ongoing management of her lymphedema at right breast/axilla/upper arm areas.   Status On-going     CC Long Term Goal  #6   Title Pain in right groin/ischial tuberosity area will be controlled at 3/10 level or less to enable walking with less pain.   Status On-going            Plan - 01/04/16 1211    Clinical Impression Statement Increased right thigh pain from radiation treatment, but helped by therapy.   Rehab Potential Good   Clinical Impairments Affecting Rehab Potential active cancer   PT Frequency 2x / week   PT Duration 12 weeks   PT Treatment/Interventions Manual techniques;Manual lymph drainage   PT Next Visit Plan Continue manual lymph drainage for right upper outer breast and for right UE; also for left rib pain; continue right thigh soft tissue work focused on medial right thigh with myofascial release   Consulted and Agree with Plan of Care Patient   PT Plan Continue manual lymph drainage, soft tissue work, myofascial release.  Patient to try different bras for some compression.      Patient will benefit from skilled therapeutic intervention in order to improve the following deficits and impairments:  Pain, Increased muscle spasms, Difficulty walking, Increased edema  Visit Diagnosis: Lymphedema, not elsewhere classified  Pain in right thigh     Problem List Patient Active Problem List   Diagnosis Date Noted  . 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 Zoster 04/04/2015  . Nausea with vomiting 11/18/2014  . Constipation 11/18/2014  . Left-sided thoracic back pain    . Bone metastases ([place]) 11/16/2014  . Back pain 11/15/2014  . Uncontrolled pain 11/14/2014  . 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 Chest wall pain 03/21/2014  . Abnormal LFTs (liver function tests) 09/12/2013  . Breast cancer of upper-inner quadrant of right female breast ([place]) 08/18/2013  . 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

## 2016-01-07 ENCOUNTER — Ambulatory Visit: Payer: 59 | Admitting: Physical Therapy

## 2016-01-07 ENCOUNTER — Other Ambulatory Visit (HOSPITAL_BASED_OUTPATIENT_CLINIC_OR_DEPARTMENT_OTHER): Payer: 59

## 2016-01-07 DIAGNOSIS — C771 Secondary and unspecified malignant neoplasm of intrathoracic lymph nodes: Secondary | ICD-10-CM

## 2016-01-07 DIAGNOSIS — C50211 Malignant neoplasm of upper-inner quadrant of right female breast: Secondary | ICD-10-CM | POA: Diagnosis not present

## 2016-01-07 DIAGNOSIS — M79651 Pain in right thigh: Secondary | ICD-10-CM

## 2016-01-07 DIAGNOSIS — I890 Lymphedema, not elsewhere classified: Secondary | ICD-10-CM

## 2016-01-07 DIAGNOSIS — J90 Pleural effusion, not elsewhere classified: Secondary | ICD-10-CM

## 2016-01-07 LAB — CBC WITH DIFFERENTIAL/PLATELET
BASO%: 2.1 % — AB (ref 0.0–2.0)
Basophils Absolute: 0.1 10*3/uL (ref 0.0–0.1)
EOS%: 1.8 % (ref 0.0–7.0)
Eosinophils Absolute: 0.1 10*3/uL (ref 0.0–0.5)
HCT: 35.1 % (ref 34.8–46.6)
HEMOGLOBIN: 12.7 g/dL (ref 11.6–15.9)
LYMPH#: 0.3 10*3/uL — AB (ref 0.9–3.3)
LYMPH%: 10.1 % — ABNORMAL LOW (ref 14.0–49.7)
MCH: 37 pg — AB (ref 25.1–34.0)
MCHC: 36.2 g/dL — ABNORMAL HIGH (ref 31.5–36.0)
MCV: 102.3 fL — ABNORMAL HIGH (ref 79.5–101.0)
MONO#: 0.3 10*3/uL (ref 0.1–0.9)
MONO%: 8.4 % (ref 0.0–14.0)
NEUT#: 2.6 10*3/uL (ref 1.5–6.5)
NEUT%: 77.6 % — AB (ref 38.4–76.8)
Platelets: 289 10*3/uL (ref 145–400)
RBC: 3.43 10*6/uL — AB (ref 3.70–5.45)
RDW: 12.9 % (ref 11.2–14.5)
WBC: 3.4 10*3/uL — ABNORMAL LOW (ref 3.9–10.3)
nRBC: 0 % (ref 0–0)

## 2016-01-07 NOTE — Therapy (Signed)
[place], [place], [address] Phone: [phone]   Fax:  [phone]  Physical Therapy Treatment  Patient Details  Name: [name] [name] MRN: [id number] Date of Birth: 05/16/1960 Referring Provider: Dr. Lurline Del  Encounter Date: 01/07/2016      PT End of Session - 01/07/16 1111    Visit Number [id number] for lymph   Number of Visits 95  for lymph   Date for PT Re-Evaluation 03/03/16   Authorization - Visit Number 140   PT Start Time 1021   PT Stop Time 1106   PT Time Calculation (min) 45 min   Activity Tolerance Patient tolerated treatment well   Behavior During Therapy [facility] for tasks assessed/performed      Past Medical History:  Diagnosis Date  . Bone metastases ([place]) dx'd 05/2014  . Breast cancer ([place]) dx'd 2005/2011  . Peripheral vascular disease ([place]) 02/2010   blood clot related to porta cath  . PONV (postoperative nausea and vomiting)   . S/P radiation therapy 07/17/2014 through 08/02/2014    Left mediastinum, left seventh rib 3250 cGy in 13 sessions   . S/P radiation therapy 12/11/2014 through 12/22/2014    Left parietal calvarium 2400 cGy in 8 sessions   . Seizures ([place]) 2010   Isolated incident.    Past Surgical History:  Procedure Laterality Date  . AXILLARY LYMPH NODE DISSECTION  Dec. 2011  . BREAST LUMPECTOMY  2005  . MEDIASTINOTOMY CHAMBERLAIN MCNEIL Left 06/02/2013   Procedure: MEDIASTINOTOMY CHAMBERLAIN MCNEIL;  Surgeon: [name], MD;  Location: [place] [facility];  Service: Thoracic;  Laterality: Left;  LEFT ANTERIOR MEDIASTINOTOMY   . PORTACATH PLACEMENT  12/11  . removal portacath      There were no vitals filed for this visit.      Subjective Assessment - 01/07/16 1023    Subjective (P)  "These bras are an  epic failure."  Getting pain at left rib area. Can tolerate about an hour with the sports bra.  Walked Friday, Saturday and Sunday, but just the short walk. Humidity these last few days flares the swelling up.   Currently in Pain? (P)  Yes   Pain Score (P)  6    Pain Location (P)  Axilla   Pain Orientation (P)  Right   Pain Score (P)  --  no pain, just tightness   Pain Location (P)  Groin   Pain Orientation (P)  Right   Pain Descriptors / Indicators (P)  Tightness  and burning with urination                         [facility] Adult PT Treatment/Exercise - 01/07/16 0001      Manual Therapy   Myofascial Release right axilla with focus on scar tightness   Manual Lymphatic Drainage (MLD) In left sidelying, posterior interaxillary anastomosis and right axillo-inguinal anastomosis; in supine, short neck, left axilla and anterior interaxillary anastomosis, right groin and axillo-inguinal anastomosis, and right upper extremity from fingers to shoulder.  In right sidelying, left periscapular area toward left groin.   Other Manual Therapy soft tissue work at superior aspect of right medial and upper thigh in prone position for pain relief and muscle relaxation, gentle myofascial release at upper thigh                  PT Short Term Goals - 02/27/15 1011      PT SHORT  TERM GOAL #1   Title pain with walking decreased >/= 25%   Time 3   Period Weeks   Status Achieved     PT SHORT TERM GOAL #2   Title pelvis stays in correct alignment for 3 weeks   Time 3   Period Weeks   Status Achieved           PT Long Term Goals - 08/14/15 1317      PT LONG TERM GOAL #1   Title indpendent with HEP   Time 12   Period Weeks   Status On-going     PT LONG TERM GOAL #2   Title pain with walking decreased >/= 80%   Time 12   Period Weeks   Status On-going     PT LONG TERM GOAL #3   Title go up and down steps with a step over step pattern   Time 6   Status Deferred      PT LONG TERM GOAL #4   Title being able to last for 5 days without therapy and pain not going above a 8/10 due to decreased muscle soreness   Time 12   Period Weeks   Status Achieved           Long Term Clinic Goals - 01/04/16 1213      CC Long Term Goal  #2   Title Pt. will report swelling is adequately managed to enable ADL function at a consistent level.   Status On-going     CC Long Term Goal  #4   Title Pain/discomfort at right axilla area will be controlled at 6/10 or less.   Status On-going     CC Long Term Goal  #5   Title Patient will avoid infection by ongoing management of her lymphedema at right breast/axilla/upper arm areas.   Status On-going     CC Long Term Goal  #6   Title Pain in right groin/ischial tuberosity area will be controlled at 3/10 level or less to enable walking with less pain.   Status On-going            Plan - 01/07/16 1112    Clinical Impression Statement Right upper outer breast tissue feels softer, less indurated today than usual recently; right posterior thigh muscles less tight today than at last visit.   Rehab Potential Good   Clinical Impairments Affecting Rehab Potential active cancer   PT Frequency 2x / week   PT Duration 12 weeks   PT Treatment/Interventions Manual techniques;Manual lymph drainage   PT Next Visit Plan Continue manual lymph drainage for right upper outer breast and for right UE; also for left rib pain; continue right thigh soft tissue work focused on medial right thigh with myofascial release   Consulted and Agree with Plan of Care Patient   PT Plan Continue manual lymph drainage, soft tissue work, myofascial release.      Patient will benefit from skilled therapeutic intervention in order to improve the following deficits and impairments:  Pain, Increased muscle spasms, Difficulty walking, Increased edema  Visit Diagnosis: Lymphedema, not elsewhere classified  Pain in right thigh     Problem  List Patient Active Problem List   Diagnosis Date Noted  . 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 Zoster 04/04/2015  . Nausea with vomiting 11/18/2014  . Constipation 11/18/2014  . Left-sided thoracic back pain   . Bone metastases ([place]) 11/16/2014  . Back pain 11/15/2014  . Uncontrolled pain 11/14/2014  .  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 Chest wall pain 03/21/2014  . Abnormal LFTs (liver function tests) 09/12/2013  . Breast cancer of upper-inner quadrant of right female breast ([place]) 08/18/2013  . 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

## 2016-01-08 ENCOUNTER — Other Ambulatory Visit: Payer: Self-pay | Admitting: *Deleted

## 2016-01-08 ENCOUNTER — Encounter: Payer: Self-pay | Admitting: Oncology

## 2016-01-08 NOTE — Progress Notes (Signed)
Faxed Rx and start date to Theodis Sato w/ [employer] for [employer].

## 2016-01-09 ENCOUNTER — Encounter: Payer: Self-pay | Admitting: Radiation Oncology

## 2016-01-09 NOTE — Progress Notes (Signed)
°  Radiation Oncology         [phone]) [phone] ________________________________  Name: ZALEYAH ROOKSTOOL MRN: [id number]  Date: 01/09/2016  DOB: [birth year]  End of Treatment Note  Diagnosis:   Bone metastases    Indication for treatment:  Curative      Radiation treatment dates:   12/24/15-01/02/16  Site/dose:   1) Left T9 Rib / 24 Gy in 8 fx   2) Right inferior pelvis/ 21 Gy in 7 fx  Beams/energy:   1) 3D / 15X, 6X        2) 3D / 15X, 6X  Narrative: The patient tolerated radiation treatment relatively well, although some discomfort increased in the right pelvic region. The treatment therefore was discontinued after 7 fractions to the right pelvis and she completed 8 fractions to 24 gray to the left rib..   Plan: The patient has completed radiation treatment. The patient will return to radiation oncology clinic for routine followup in one month. I advised them to call or return sooner if they have any questions or concerns related to their recovery or treatment.  ------------------------------------------------  [name] Gross, MD, PhD  This document serves as a record of services personally performed by Kyung Rudd, MD. It was created on his behalf by Bethann Humble, a trained medical scribe. The creation of this record is based on the scribe's personal observations and the provider's statements to them. This document has been checked and approved by the attending provider.

## 2016-01-11 ENCOUNTER — Ambulatory Visit: Payer: 59 | Admitting: Physical Therapy

## 2016-01-11 DIAGNOSIS — I890 Lymphedema, not elsewhere classified: Secondary | ICD-10-CM | POA: Diagnosis not present

## 2016-01-11 DIAGNOSIS — M79651 Pain in right thigh: Secondary | ICD-10-CM

## 2016-01-11 NOTE — Therapy (Signed)
[place], [place], [address] Phone: [phone]   Fax:  [phone]  Physical Therapy Treatment  Patient Details  Name: [name] [name] MRN: [id number] Date of Birth: [birth year] Referring Provider: Dr. Lurline Del  Encounter Date: 01/11/2016      PT End of Session - 01/11/16 1211    Visit Number [id number] for lymph   Number of Visits 95  for lymph   Date for PT Re-Evaluation 03/03/16  for lymph   PT Start Time 1020   PT Stop Time 1102   PT Time Calculation (min) 42 min   Activity Tolerance Patient tolerated treatment well   Behavior During Therapy [facility] for tasks assessed/performed      Past Medical History:  Diagnosis Date  . Bone metastases ([place]) dx'd 05/2014  . Breast cancer ([place]) dx'd 2005/2011  . Peripheral vascular disease ([place]) 02/2010   blood clot related to porta cath  . PONV (postoperative nausea and vomiting)   . S/P radiation therapy 07/17/2014 through 08/02/2014    Left mediastinum, left seventh rib 3250 cGy in 13 sessions   . S/P radiation therapy 12/11/2014 through 12/22/2014    Left parietal calvarium 2400 cGy in 8 sessions   . Seizures ([place]) 2010   Isolated incident.    Past Surgical History:  Procedure Laterality Date  . AXILLARY LYMPH NODE DISSECTION  Dec. 2011  . BREAST LUMPECTOMY  2005  . MEDIASTINOTOMY CHAMBERLAIN MCNEIL Left 06/02/2013   Procedure: MEDIASTINOTOMY CHAMBERLAIN MCNEIL;  Surgeon: [name], MD;  Location: [facility];  Service: Thoracic;  Laterality: Left;  LEFT ANTERIOR MEDIASTINOTOMY   . PORTACATH PLACEMENT  12/11  . removal portacath      There were no vitals filed for this visit.      Subjective Assessment - 01/11/16 1024    Currently in Pain? (P)  Yes   Pain Score (P)  6    Pain  Location (P)  Axilla   Pain Orientation (P)  Right   Pain Score (P)  1   Pain Location (P)  Groin   Pain Orientation (P)  Right   Pain Descriptors / Indicators (P)  Pressure   Aggravating Factors  (P)  recent radiation treatment   Pain Score (P)  0  it comes and goes   Pain Location (P)  Rib cage   Pain Orientation (P)  Left                         [facility] Adult PT Treatment/Exercise - 01/11/16 0001      Manual Therapy   Myofascial Release right axilla with focus on scar tightness   Manual Lymphatic Drainage (MLD) In left sidelying, posterior interaxillary anastomosis and right axillo-inguinal anastomosis; in supine, short neck, left axilla and anterior interaxillary anastomosis, right groin and axillo-inguinal anastomosis, and right upper extremity from fingers to shoulder.  In right sidelying, left periscapular area toward left groin.   Other Manual Therapy soft tissue work at superior aspect of right medial and upper thigh in prone position for pain relief and muscle relaxation, gentle myofascial release at upper thigh                  PT Short Term Goals - 02/27/15 1011      PT SHORT TERM GOAL #1   Title pain with walking decreased >/= 25%   Time 3   Period Weeks   Status Achieved  PT SHORT TERM GOAL #2   Title pelvis stays in correct alignment for 3 weeks   Time 3   Period Weeks   Status Achieved           PT Long Term Goals - 08/14/15 1317      PT LONG TERM GOAL #1   Title indpendent with HEP   Time 12   Period Weeks   Status On-going     PT LONG TERM GOAL #2   Title pain with walking decreased >/= 80%   Time 12   Period Weeks   Status On-going     PT LONG TERM GOAL #3   Title go up and down steps with a step over step pattern   Time 6   Status Deferred     PT LONG TERM GOAL #4   Title being able to last for 5 days without therapy and pain not going above a 8/10 due to decreased muscle soreness   Time 12   Period Weeks    Status Achieved           Long Term Clinic Goals - 01/11/16 1213      CC Long Term Goal  #2   Title Pt. will report swelling is adequately managed to enable ADL function at a consistent level.   Status On-going     CC Long Term Goal  #4   Title Pain/discomfort at right axilla area will be controlled at 6/10 or less.   Status On-going     CC Long Term Goal  #5   Title Patient will avoid infection by ongoing management of her lymphedema at right breast/axilla/upper arm areas.   Status On-going     CC Long Term Goal  #6   Title Pain in right groin/ischial tuberosity area will be controlled at 3/10 level or less to enable walking with less pain.   Status On-going            Plan - 01/11/16 1212    Clinical Impression Statement Mild induration at right upper outer breast; palpable tightness at superior posterior right thigh today.   Rehab Potential Good   Clinical Impairments Affecting Rehab Potential active cancer   PT Frequency 2x / week   PT Duration 12 weeks   PT Treatment/Interventions Manual techniques;Manual lymph drainage   PT Next Visit Plan Continue manual lymph drainage for right upper outer breast and for right UE; also for left rib pain; continue right thigh soft tissue work focused on medial right thigh with myofascial release   Consulted and Agree with Plan of Care Patient   PT Plan Continue manual lymph drainage, soft tissue work, myofascial release.      Patient will benefit from skilled therapeutic intervention in order to improve the following deficits and impairments:  Pain, Increased muscle spasms, Difficulty walking, Increased edema  Visit Diagnosis: Lymphedema, not elsewhere classified  Pain in right thigh     Problem List Patient Active Problem List   Diagnosis Date Noted  . 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 Zoster 04/04/2015  . Nausea with vomiting 11/18/2014  . Constipation 11/18/2014  . Left-sided thoracic back pain    . Bone metastases ([place]) 11/16/2014  . Back pain 11/15/2014  . Uncontrolled pain 11/14/2014  . 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 Chest wall pain 03/21/2014  . Abnormal LFTs (liver function tests) 09/12/2013  . Breast cancer of upper-inner quadrant of right female breast ([place]) 08/18/2013  . Secondary malignant  neoplasm of mediastinal lymph node ([place]) 08/18/2013    [name],[name] 01/11/2016, 12:16 PM  [place], [place], [address] Phone: [phone]   Fax:  [phone]  Name: [name] [name] MRN: [id number] Date of Birth: 04-May-1960  [name], PT 01/11/16 12:16 PM

## 2016-01-14 ENCOUNTER — Other Ambulatory Visit (HOSPITAL_BASED_OUTPATIENT_CLINIC_OR_DEPARTMENT_OTHER): Payer: 59

## 2016-01-14 ENCOUNTER — Ambulatory Visit: Payer: 59 | Admitting: Physical Therapy

## 2016-01-14 DIAGNOSIS — J90 Pleural effusion, not elsewhere classified: Secondary | ICD-10-CM

## 2016-01-14 DIAGNOSIS — C7951 Secondary malignant neoplasm of bone: Secondary | ICD-10-CM

## 2016-01-14 DIAGNOSIS — C771 Secondary and unspecified malignant neoplasm of intrathoracic lymph nodes: Secondary | ICD-10-CM

## 2016-01-14 DIAGNOSIS — I890 Lymphedema, not elsewhere classified: Secondary | ICD-10-CM | POA: Diagnosis not present

## 2016-01-14 DIAGNOSIS — C50211 Malignant neoplasm of upper-inner quadrant of right female breast: Secondary | ICD-10-CM | POA: Diagnosis not present

## 2016-01-14 DIAGNOSIS — M79651 Pain in right thigh: Secondary | ICD-10-CM

## 2016-01-14 LAB — CBC WITH DIFFERENTIAL/PLATELET
BASO%: 1.8 % (ref 0.0–2.0)
BASOS ABS: 0.1 10*3/uL (ref 0.0–0.1)
EOS%: 2.6 % (ref 0.0–7.0)
Eosinophils Absolute: 0.1 10*3/uL (ref 0.0–0.5)
HCT: 34.5 % — ABNORMAL LOW (ref 34.8–46.6)
HEMOGLOBIN: 12.2 g/dL (ref 11.6–15.9)
LYMPH%: 10.7 % — AB (ref 14.0–49.7)
MCH: 36.6 pg — AB (ref 25.1–34.0)
MCHC: 35.4 g/dL (ref 31.5–36.0)
MCV: 103.6 fL — ABNORMAL HIGH (ref 79.5–101.0)
MONO#: 0.3 10*3/uL (ref 0.1–0.9)
MONO%: 10.3 % (ref 0.0–14.0)
NEUT#: 2 10*3/uL (ref 1.5–6.5)
NEUT%: 74.6 % (ref 38.4–76.8)
Platelets: 234 10*3/uL (ref 145–400)
RBC: 3.33 10*6/uL — AB (ref 3.70–5.45)
RDW: 12.9 % (ref 11.2–14.5)
WBC: 2.7 10*3/uL — AB (ref 3.9–10.3)
lymph#: 0.3 10*3/uL — ABNORMAL LOW (ref 0.9–3.3)

## 2016-01-14 LAB — COMPREHENSIVE METABOLIC PANEL
ALBUMIN: 4 g/dL (ref 3.5–5.0)
ALT: 33 U/L (ref 0–55)
AST: 29 U/L (ref 5–34)
Alkaline Phosphatase: 71 U/L (ref 40–150)
Anion Gap: 10 mEq/L (ref 3–11)
BUN: 10.5 mg/dL (ref 7.0–26.0)
CHLORIDE: 99 meq/L (ref 98–109)
CO2: 25 meq/L (ref 22–29)
Calcium: 9.6 mg/dL (ref 8.4–10.4)
Creatinine: 0.8 mg/dL (ref 0.6–1.1)
EGFR: 79 mL/min/{1.73_m2} — ABNORMAL LOW (ref >90)
GLUCOSE: 78 mg/dL (ref 70–140)
POTASSIUM: 4.2 meq/L (ref 3.5–5.1)
SODIUM: 133 meq/L — AB (ref 136–145)
Total Bilirubin: 0.31 mg/dL (ref 0.20–1.20)
Total Protein: 7.5 g/dL (ref 6.4–8.3)

## 2016-01-14 NOTE — Therapy (Signed)
[place], [place], [address] Phone: [phone]   Fax:  [phone]  Physical Therapy Treatment  Patient Details  Name: [name] [name] MRN: [id number] Date of Birth: 1960-12-27 Referring Provider: Dr. Lurline Del  Encounter Date: 01/14/2016      PT End of Session - 01/14/16 1257    Visit Number [id number] for lymph   Number of Visits 95  for lymph   Date for PT Re-Evaluation 03/03/16   Authorization - Visit Number 140      Past Medical History:  Diagnosis Date  . Bone metastases ([place]) dx'd 05/2014  . Breast cancer ([place]) dx'd 2005/2011  . Peripheral vascular disease ([place]) 02/2010   blood clot related to porta cath  . PONV (postoperative nausea and vomiting)   . S/P radiation therapy 07/17/2014 through 08/02/2014    Left mediastinum, left seventh rib 3250 cGy in 13 sessions   . S/P radiation therapy 12/11/2014 through 12/22/2014    Left parietal calvarium 2400 cGy in 8 sessions   . Seizures ([place]) 2010   Isolated incident.    Past Surgical History:  Procedure Laterality Date  . AXILLARY LYMPH NODE DISSECTION  Dec. 2011  . BREAST LUMPECTOMY  2005  . MEDIASTINOTOMY CHAMBERLAIN MCNEIL Left 06/02/2013   Procedure: MEDIASTINOTOMY CHAMBERLAIN MCNEIL;  Surgeon: [name], MD;  Location: [facility];  Service: Thoracic;  Laterality: Left;  LEFT ANTERIOR MEDIASTINOTOMY   . PORTACATH PLACEMENT  12/11  . removal portacath      There were no vitals filed for this visit.      Subjective Assessment - 01/14/16 1022    Subjective "I walked Saturday and I walked Sunday."  Having intermittent pain in right groin.   Pain Score 6   5-6   Pain Location Axilla   Pain Orientation Right   Pain Descriptors / Indicators Other (Comment)  full   Aggravating Factors  increased swelling   Pain Relieving Factors therapy   Pain Score 8  up to 8/intermittent   Pain Location Groin   Pain Orientation Right   Pain Descriptors / Indicators Shooting  tightness   Pain Frequency Intermittent   Aggravating Factors  she thinks it's inflammation   Pain Relieving Factors Aleve,                          [facility] Adult PT Treatment/Exercise - 01/14/16 0001      Manual Therapy   Myofascial Release right axilla with focus on scar tightness   Manual Lymphatic Drainage (MLD) In left sidelying, posterior interaxillary anastomosis and right axillo-inguinal anastomosis; in supine, short neck, left axilla and anterior interaxillary anastomosis, right groin and axillo-inguinal anastomosis, and right upper extremity from fingers to shoulder.  In right sidelying, left periscapular area toward left groin.   Other Manual Therapy soft tissue work at superior aspect of right medial and upper thigh in prone position for pain relief and muscle relaxation, gentle myofascial release at upper thigh                  PT Short Term Goals - 02/27/15 1011      PT SHORT TERM GOAL #1   Title pain with walking decreased >/= 25%   Time 3   Period Weeks   Status Achieved     PT SHORT TERM GOAL #2   Title pelvis stays in correct alignment for 3 weeks   Time 3  Period Weeks   Status Achieved           PT Long Term Goals - 08/14/15 1317      PT LONG TERM GOAL #1   Title indpendent with HEP   Time 12   Period Weeks   Status On-going     PT LONG TERM GOAL #2   Title pain with walking decreased >/= 80%   Time 12   Period Weeks   Status On-going     PT LONG TERM GOAL #3   Title go up and down steps with a step over step pattern   Time 6   Status Deferred     PT LONG TERM GOAL #4   Title being able to last for 5 days without therapy and pain not going above a 8/10 due to decreased muscle soreness   Time 12   Period Weeks    Status Achieved           Long Term Clinic Goals - 01/11/16 1213      CC Long Term Goal  #2   Title Pt. will report swelling is adequately managed to enable ADL function at a consistent level.   Status On-going     CC Long Term Goal  #4   Title Pain/discomfort at right axilla area will be controlled at 6/10 or less.   Status On-going     CC Long Term Goal  #5   Title Patient will avoid infection by ongoing management of her lymphedema at right breast/axilla/upper arm areas.   Status On-going     CC Long Term Goal  #6   Title Pain in right groin/ischial tuberosity area will be controlled at 3/10 level or less to enable walking with less pain.   Status On-going            Plan - 01/14/16 1258    Clinical Impression Statement Moderate fullness at right upper outer breast, moderate tightness at right thigh today.   Rehab Potential Good   Clinical Impairments Affecting Rehab Potential active cancer   PT Frequency 2x / week   PT Duration 12 weeks   PT Treatment/Interventions Manual techniques;Manual lymph drainage   PT Next Visit Plan Continue manual lymph drainage for right upper outer breast and for right UE; also for left rib pain; continue right thigh soft tissue work focused on medial right thigh with myofascial release   Consulted and Agree with Plan of Care Patient   PT Plan Continue manual lymph drainage, soft tissue work, myofascial release.      Patient will benefit from skilled therapeutic intervention in order to improve the following deficits and impairments:  Pain, Increased muscle spasms, Difficulty walking, Increased edema  Visit Diagnosis: Lymphedema, not elsewhere classified  Pain in right thigh     Problem List Patient Active Problem List   Diagnosis Date Noted  . 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 Zoster 04/04/2015  . Nausea with vomiting 11/18/2014  . Constipation 11/18/2014  . Left-sided thoracic back pain   . Bone metastases  ([place]) 11/16/2014  . Back pain 11/15/2014  . Uncontrolled pain 11/14/2014  . 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 Chest wall pain 03/21/2014  . Abnormal LFTs (liver function tests) 09/12/2013  . Breast cancer of upper-inner quadrant of right female breast ([place]) 08/18/2013  . 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

## 2016-01-15 ENCOUNTER — Ambulatory Visit: Payer: 59 | Attending: Radiation Oncology | Admitting: Physical Therapy

## 2016-01-15 ENCOUNTER — Encounter: Payer: Self-pay | Admitting: Physical Therapy

## 2016-01-15 DIAGNOSIS — M62838 Other muscle spasm: Secondary | ICD-10-CM | POA: Diagnosis not present

## 2016-01-15 LAB — CANCER ANTIGEN 27.29: CA 27.29: 2432.2 U/mL — ABNORMAL HIGH (ref 0.0–38.6)

## 2016-01-15 NOTE — Patient Instructions (Addendum)
     Moisturizers . They are used in the vagina to hydrate the mucous membrane that make up the vaginal canal. . Designed to keep a more normal acid balance (ph) . Once placed in the vagina, it will last between two to three days.  . Use 2-3 times per week at bedtime and last longer than 60 min. . Ingredients to avoid is glycerin and fragrance, can increase chance of infection . Should not be used just before sex due to causing irritation . Most are gels administered either in a tampon-shaped applicator or as a vaginal suppository. They are non-hormonal.   Types of Moisturizers . Replens- drug store . Samul Dada- drug store . Vitamin E vaginal suppositories- Whole foods . Moist Again . Coconut oil- can break down condoms  Things to avoid in the vaginal area . Do not use things to irritate the vulvar area . No lotions . No soaps; can use Aveeno or Calendula cleanser if needed. Must be gentle . No deodorants . No douches . Good to sleep without underwear to let the vaginal area to air out . No scrubbing: spread the lips to let warm water rinse over labias and pat dry [facility] [address], [place], [place] [address] Phone # [phone] Fax [phone]

## 2016-01-15 NOTE — Therapy (Signed)
[facility] [address], [place], [place], [address] Phone: [phone]   Fax:  [phone]  Physical Therapy Evaluation  Patient Details  Name: [name] [name] MRN: [id number] Date of Birth: [birth year] Referring Provider: Dr. Kyung Rudd  Encounter Date: 01/15/2016      PT End of Session - 01/15/16 1318    Visit Number 113  pelvis   Number of Visits --  pelvis   Date for PT Re-Evaluation 03/11/16  pelvis   Authorization - Visit Number 140   Authorization - Number of Visits 113   PT Start Time [id number]   PT Stop Time 1230   PT Time Calculation (min) 45 min   Activity Tolerance Patient tolerated treatment well   Behavior During Therapy [facility] for tasks assessed/performed      Past Medical History:  Diagnosis Date  . Bone metastases ([place]) dx'd 05/2014  . Breast cancer ([place]) dx'd 2005/2011  . Peripheral vascular disease ([place]) 02/2010   blood clot related to porta cath  . PONV (postoperative nausea and vomiting)   . S/P radiation therapy 07/17/2014 through 08/02/2014    Left mediastinum, left seventh rib 3250 cGy in 13 sessions   . S/P radiation therapy 12/11/2014 through 12/22/2014    Left parietal calvarium 2400 cGy in 8 sessions   . Seizures ([place]) 2010   Isolated incident.    Past Surgical History:  Procedure Laterality Date  . AXILLARY LYMPH NODE DISSECTION  Dec. 2011  . BREAST LUMPECTOMY  2005  . MEDIASTINOTOMY CHAMBERLAIN MCNEIL Left 06/02/2013   Procedure: MEDIASTINOTOMY CHAMBERLAIN MCNEIL;  Surgeon: [name], MD;  Location: [facility];  Service: Thoracic;  Laterality: Left;  LEFT ANTERIOR MEDIASTINOTOMY   . PORTACATH PLACEMENT  12/11  . removal portacath      There were no vitals filed for this visit.       Subjective Assessment -  01/15/16 1201    Subjective On my last session with radiation, I had a burn from it on 01/01/2016.  Patient felt a burn with radiation. I only had 7 visit instead of 8 due to the burn.  The last fracture from last spring was healed.  Burining with urination is gone.  When patient walks she will have a shooting pain up into the vaginal canal.  I feel there is inflammation around the healed fracture site.    How long can you walk comfortably? more she walks patient will have increased pain that stops her from walking further   Currently in Pain? Yes   Pain Score 6    Pain Location Perineum   Pain Orientation Posterior   Pain Descriptors / Indicators Shooting;Dull;Constant   Pain Type Chronic pain   Pain Onset More than a month ago   Pain Frequency Constant   Aggravating Factors  walking   Pain Relieving Factors therapy   Multiple Pain Sites No            [facility] PT Assessment - 01/15/16 0001      Assessment   Medical Diagnosis Osseous Metastasis   Referring Provider Dr. Kyung Rudd   Prior Therapy yes for the pelvic floor and lymphedema     Precautions   Precautions Other (comment)   Precaution Comments No ultrasound     Restrictions   Weight Bearing Restrictions No     Balance Screen   Has the patient fallen in the past 6 months No   Has the patient had a decrease in  activity level because of a fear of falling?  No   Is the patient reluctant to leave their home because of a fear of falling?  No     Home Ecologist residence     Prior Function   Level of Independence Independent     Cognition   Overall Cognitive Status Within Functional Limits for tasks assessed     Observation/Other Assessments   Focus on Therapeutic Outcomes (FOTO)  42% limitation     Strength   Right Hip Flexion 4+/5  pain in posterior perineum   Right Hip External Rotation  4+/5  tight rope feeling in the right groin   Right Hip ABduction 5/5     Ambulation/Gait    Ambulation/Gait Yes   Ambulation/Gait Assistance 7: Independent   Assistive device None   Gait Pattern --  slight limp on right when she first went from sit to stand                 Pelvic Floor Special Questions - 01/15/16 0001    Urinary Leakage Yes   Activities that cause leaking Sneezing   Pelvic Floor Internal Exam Patient approves physical therapist to perform pelvic floor muscle assessment   Exam Type Vaginal   Palpation palpable tenderness located on the right ischial ramus internally with swelling and left ischiococcygeus and puborectalis   Strength good squeeze, good lift, able to hold agaisnt strong resistance          [facility] Adult PT Treatment/Exercise - 01/15/16 0001      Manual Therapy   Manual Therapy Internal Pelvic Floor   Internal Pelvic Floor soft tissue work to right pubic rami and obturator internist with pulsating feeling for a myofascial release                PT Education - 01/15/16 1357    Education provided Yes   Education Details vulvar cream for dryness and vaginal  suppositories for vaginal dryness, information on vaginal moisturizers   Person(s) Educated Patient   Methods Explanation;Handout   Comprehension Verbalized understanding          PT Short Term Goals - 01/15/16 1401      PT SHORT TERM GOAL #1   Title pain with walking decreased >/= 25%   Time 4   Period Weeks   Status New     PT SHORT TERM GOAL #2   Title ------           PT Long Term Goals - 01/15/16 1359      PT LONG TERM GOAL #1   Title indpendent with HEP   Time 8   Period Weeks   Status New     PT LONG TERM GOAL #2   Title pain with walking decreased >/= 75%   Time 8   Period Weeks   Status New     PT LONG TERM GOAL #3   Title ability to flex her right hip with right groin pain decreased >/= 50% due to improved tissue mobility   Time 8   Period Weeks   Status New     PT LONG TERM GOAL #4   Title -------           Long Term  Clinic Goals - 01/11/16 1213      CC Long Term Goal  #2   Title Pt. will report swelling is adequately managed to enable ADL function at a consistent level.  Status On-going     CC Long Term Goal  #4   Title Pain/discomfort at right axilla area will be controlled at 6/10 or less.   Status On-going     CC Long Term Goal  #5   Title Patient will avoid infection by ongoing management of her lymphedema at right breast/axilla/upper arm areas.   Status On-going     CC Long Term Goal  #6   Title Pain in right groin/ischial tuberosity area will be controlled at 3/10 level or less to enable walking with less pain.   Status On-going            Plan - 01/15/16 1401    Clinical Impression Statement Patient is a 55 year old female with diagnosis of Osseous metastasis.  Patient had 7 rounds of radiation to the pelvic area. On the last on, 01/01/2016, the pelvic area was burned and she had pain with urination.  Pateint reports no pain with urination now but pain with walking and sitting.  Pateint reports constant pain at level 6/10. Pelvic floor strength is 4/5.  Palpable tenderness located in right pubic rami and left ischiococcygeus and puborectalis.  Patient walks with a slight limp on the right when she first gets out of a chair.  Patient is moderately complex evaluation due to an evolving condition and comorbidities such as  osseous matastasis, history of radiation to the pelvic area, breast cancer that will impact care provided.  Patient will benefit from skilled therapy to reduce pain so she is able to walk.  pelvis   Rehab Potential Good  pelvis   Clinical Impairments Affecting Rehab Potential active cancer  pelvis   PT Frequency 2x / week  pelvis   PT Duration 8 weeks  pelvis   PT Treatment/Interventions Manual techniques;Therapeutic exercise;Therapeutic activities  pelvis   PT Next Visit Plan check pelvic alignment; soft tissue work  pelvis   PT Home Exercise Plan progress as needed   pelvis   Recommended Other Services None  pelvis   Consulted and Agree with Plan of Care Patient  pelvis   PT Plan --      Patient will benefit from skilled therapeutic intervention in order to improve the following deficits and impairments:  Increased fascial restricitons, Increased muscle spasms, Pain (pelvis)  Visit Diagnosis: Other muscle spasm - Plan: PT plan of care cert/re-cert     Problem List Patient Active Problem List   Diagnosis Date Noted  . 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 Zoster 04/04/2015  . Nausea with vomiting 11/18/2014  . Constipation 11/18/2014  . Left-sided thoracic back pain   . Bone metastases ([place]) 11/16/2014  . Back pain 11/15/2014  . Uncontrolled pain 11/14/2014  . 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 Chest wall pain 03/21/2014  . Abnormal LFTs (liver function tests) 09/12/2013  . Breast cancer of upper-inner quadrant of right female breast ([place]) 08/18/2013  . 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, [place], [place], [address] Phone: [phone]   Fax:  [phone]  Name: MARNE VARELLA MRN: [id number] Date of Birth: 14-Feb-1961

## 2016-01-16 ENCOUNTER — Ambulatory Visit (HOSPITAL_BASED_OUTPATIENT_CLINIC_OR_DEPARTMENT_OTHER): Payer: 59

## 2016-01-16 VITALS — BP 137/84 | HR 90 | Temp 97.6°F | Resp 18

## 2016-01-16 DIAGNOSIS — Z5111 Encounter for antineoplastic chemotherapy: Secondary | ICD-10-CM

## 2016-01-16 DIAGNOSIS — C771 Secondary and unspecified malignant neoplasm of intrathoracic lymph nodes: Secondary | ICD-10-CM

## 2016-01-16 DIAGNOSIS — C7951 Secondary malignant neoplasm of bone: Secondary | ICD-10-CM

## 2016-01-16 DIAGNOSIS — C50211 Malignant neoplasm of upper-inner quadrant of right female breast: Secondary | ICD-10-CM | POA: Diagnosis not present

## 2016-01-16 MED ORDER — FULVESTRANT 250 MG/5ML IM SOLN
500.0000 mg | Freq: Once | INTRAMUSCULAR | Status: AC
  Administered 2016-01-16: 500 mg via INTRAMUSCULAR
  Filled 2016-01-16: qty 10

## 2016-01-16 MED ORDER — DENOSUMAB 120 MG/1.7ML ~~LOC~~ SOLN
120.0000 mg | Freq: Once | SUBCUTANEOUS | Status: AC
  Administered 2016-01-16: 120 mg via SUBCUTANEOUS
  Filled 2016-01-16: qty 1.7

## 2016-01-16 NOTE — Patient Instructions (Signed)
Denosumab injection What is this medicine? DENOSUMAB (den oh sue mab) slows bone breakdown. Prolia is used to treat osteoporosis in women after menopause and in men. Xgeva is used to prevent bone fractures and other bone problems caused by cancer bone metastases. Xgeva is also used to treat giant cell tumor of the bone. This medicine may be used for other purposes; ask your health care provider or pharmacist if you have questions. What should I tell my health care provider before I take this medicine? They need to know if you have any of these conditions: -dental disease -eczema -infection or history of infections -kidney disease or on dialysis -low blood calcium or vitamin D -malabsorption syndrome -scheduled to have surgery or tooth extraction -taking medicine that contains denosumab -thyroid or parathyroid disease -an unusual reaction to denosumab, other medicines, foods, dyes, or preservatives -pregnant or trying to get pregnant -breast-feeding How should I use this medicine? This medicine is for injection under the skin. It is given by a health care professional in a hospital or clinic setting. If you are getting Prolia, a special MedGuide will be given to you by the pharmacist with each prescription and refill. Be sure to read this information carefully each time. For Prolia, talk to your pediatrician regarding the use of this medicine in children. Special care may be needed. For Xgeva, talk to your pediatrician regarding the use of this medicine in children. While this drug may be prescribed for children as young as 13 years for selected conditions, precautions do apply. Overdosage: If you think you have taken too much of this medicine contact a poison control center or emergency room at once. NOTE: This medicine is only for you. Do not share this medicine with others. What if I miss a dose? It is important not to miss your dose. Call your doctor or health care professional if you are  unable to keep an appointment. What may interact with this medicine? Do not take this medicine with any of the following medications: -other medicines containing denosumab This medicine may also interact with the following medications: -medicines that suppress the immune system -medicines that treat cancer -steroid medicines like prednisone or cortisone This list may not describe all possible interactions. Give your health care provider a list of all the medicines, herbs, non-prescription drugs, or dietary supplements you use. Also tell them if you smoke, drink alcohol, or use illegal drugs. Some items may interact with your medicine. What should I watch for while using this medicine? Visit your doctor or health care professional for regular checks on your progress. Your doctor or health care professional may order blood tests and other tests to see how you are doing. Call your doctor or health care professional if you get a cold or other infection while receiving this medicine. Do not treat yourself. This medicine may decrease your body's ability to fight infection. You should make sure you get enough calcium and vitamin D while you are taking this medicine, unless your doctor tells you not to. Discuss the foods you eat and the vitamins you take with your health care professional. See your dentist regularly. Brush and floss your teeth as directed. Before you have any dental work done, tell your dentist you are receiving this medicine. Do not become pregnant while taking this medicine or for 5 months after stopping it. Women should inform their doctor if they wish to become pregnant or think they might be pregnant. There is a potential for serious side effects   to an unborn child. Talk to your health care professional or pharmacist for more information. What side effects may I notice from receiving this medicine? Side effects that you should report to your doctor or health care professional as soon as  possible: -allergic reactions like skin rash, itching or hives, swelling of the face, lips, or tongue -breathing problems -chest pain -fast, irregular heartbeat -feeling faint or lightheaded, falls -fever, chills, or any other sign of infection -muscle spasms, tightening, or twitches -numbness or tingling -skin blisters or bumps, or is dry, peels, or red -slow healing or unexplained pain in the mouth or jaw -unusual bleeding or bruising Side effects that usually do not require medical attention (Report these to your doctor or health care professional if they continue or are bothersome.): -muscle pain -stomach upset, gas This list may not describe all possible side effects. Call your doctor for medical advice about side effects. You may report side effects to FDA at 1-800-FDA-1088. Where should I keep my medicine? This medicine is only given in a clinic, doctor's office, or other health care setting and will not be stored at home. NOTE: This sheet is a summary. It may not cover all possible information. If you have questions about this medicine, talk to your doctor, pharmacist, or health care provider.    2016, Elsevier/Gold Standard. (2011-09-15 12:37:47) Fulvestrant injection What is this medicine? FULVESTRANT (ful VES trant) blocks the effects of estrogen. It is used to treat breast cancer. This medicine may be used for other purposes; ask your health care provider or pharmacist if you have questions. What should I tell my health care provider before I take this medicine? They need to know if you have any of these conditions: -bleeding problems -liver disease -low levels of platelets in the blood -an unusual or allergic reaction to fulvestrant, other medicines, foods, dyes, or preservatives -pregnant or trying to get pregnant -breast-feeding How should I use this medicine? This medicine is for injection into a muscle. It is usually given by a health care professional in a  hospital or clinic setting. Talk to your pediatrician regarding the use of this medicine in children. Special care may be needed. Overdosage: If you think you have taken too much of this medicine contact a poison control center or emergency room at once. NOTE: This medicine is only for you. Do not share this medicine with others. What if I miss a dose? It is important not to miss your dose. Call your doctor or health care professional if you are unable to keep an appointment. What may interact with this medicine? -medicines that treat or prevent blood clots like warfarin, enoxaparin, and dalteparin This list may not describe all possible interactions. Give your health care provider a list of all the medicines, herbs, non-prescription drugs, or dietary supplements you use. Also tell them if you smoke, drink alcohol, or use illegal drugs. Some items may interact with your medicine. What should I watch for while using this medicine? Your condition will be monitored carefully while you are receiving this medicine. You will need important blood work done while you are taking this medicine. Do not become pregnant while taking this medicine or for at least 1 year after stopping it. Women of child-bearing potential will need to have a negative pregnancy test before starting this medicine. Women should inform their doctor if they wish to become pregnant or think they might be pregnant. There is a potential for serious side effects to an unborn child. Men   should inform their doctors if they wish to father a child. This medicine may lower sperm counts. Talk to your health care professional or pharmacist for more information. Do not breast-feed an infant while taking this medicine or for 1 year after the last dose. What side effects may I notice from receiving this medicine? Side effects that you should report to your doctor or health care professional as soon as possible: -allergic reactions like skin rash,  itching or hives, swelling of the face, lips, or tongue -feeling faint or lightheaded, falls -pain, tingling, numbness, or weakness in the legs -signs and symptoms of infection like fever or chills; cough; flu-like symptoms; sore throat -vaginal bleeding Side effects that usually do not require medical attention (report to your doctor or health care professional if they continue or are bothersome): -aches, pains -constipation -diarrhea -headache -hot flashes -nausea, vomiting -pain at site where injected -stomach pain This list may not describe all possible side effects. Call your doctor for medical advice about side effects. You may report side effects to FDA at 1-800-FDA-1088. Where should I keep my medicine? This drug is given in a hospital or clinic and will not be stored at home. NOTE: This sheet is a summary. It may not cover all possible information. If you have questions about this medicine, talk to your doctor, pharmacist, or health care provider.    2016, Elsevier/Gold Standard. (2014-10-13 11:03:55)  

## 2016-01-17 ENCOUNTER — Encounter: Payer: Self-pay | Admitting: Physical Therapy

## 2016-01-17 ENCOUNTER — Ambulatory Visit: Payer: 59 | Admitting: Physical Therapy

## 2016-01-17 DIAGNOSIS — M62838 Other muscle spasm: Secondary | ICD-10-CM

## 2016-01-17 NOTE — Therapy (Signed)
[facility] [address], [place], [place], [address] Phone: [phone]   Fax:  [phone]  Physical Therapy Treatment  Patient Details  Name: [name] [name] MRN: [id number] Date of Birth: 08-22-1960 Referring Provider: Dr. Kyung Rudd  Encounter Date: 01/17/2016      PT End of Session - 01/17/16 1018    Visit Number 114  pelvis   Date for PT Re-Evaluation 03/11/16  pelvis   Authorization - Visit Number 140   Authorization - Number of Visits 114   PT Start Time [id number]   PT Stop Time 1045   PT Time Calculation (min) 30 min   Activity Tolerance Patient tolerated treatment well   Behavior During Therapy [facility] for tasks assessed/performed      Past Medical History:  Diagnosis Date  . Bone metastases ([place]) dx'd 05/2014  . Breast cancer ([place]) dx'd 2005/2011  . Peripheral vascular disease ([place]) 02/2010   blood clot related to porta cath  . PONV (postoperative nausea and vomiting)   . S/P radiation therapy 07/17/2014 through 08/02/2014    Left mediastinum, left seventh rib 3250 cGy in 13 sessions   . S/P radiation therapy 12/11/2014 through 12/22/2014    Left parietal calvarium 2400 cGy in 8 sessions   . Seizures ([place]) 2010   Isolated incident.    Past Surgical History:  Procedure Laterality Date  . AXILLARY LYMPH NODE DISSECTION  Dec. 2011  . BREAST LUMPECTOMY  2005  . MEDIASTINOTOMY CHAMBERLAIN MCNEIL Left 06/02/2013   Procedure: MEDIASTINOTOMY CHAMBERLAIN MCNEIL;  Surgeon: [name], MD;  Location: [facility];  Service: Thoracic;  Laterality: Left;  LEFT ANTERIOR MEDIASTINOTOMY   . PORTACATH PLACEMENT  12/11  . removal portacath      There were no vitals filed for this visit.      Subjective Assessment - 01/17/16 1018    Subjective The little  bit of treatment has helped.  I have ordered the cream.    How long can you walk comfortably? more she walks patient will have increased pain that stops her from walking further   Diagnostic tests x rays of back show no fractures;    Patient Stated Goals walk 20 minutes with minimal limp   Currently in Pain? Yes   Pain Score 3    Pain Location Perineum   Pain Orientation Posterior   Pain Descriptors / Indicators Discomfort   Pain Type Chronic pain   Pain Onset More than a month ago   Pain Frequency Constant   Aggravating Factors  walking, sitting   Pain Relieving Factors therapy   Multiple Pain Sites No                      Pelvic Floor Special Questions - 01/17/16 0001    Pelvic Floor Internal Exam Patient approves physical therapist to perform pelvic floor muscle assessment   Exam Type Vaginal           [facility] Adult PT Treatment/Exercise - 01/17/16 0001      Manual Therapy   Manual Therapy Internal Pelvic Floor   Internal Pelvic Floor left side of ichiococcygeus, coccygeus, puborectalis with trigger point release; right side of puborectalis  hookly                PT Education - 01/17/16 1044    Education provided No          PT Short Term Goals - 01/15/16 1401  PT SHORT TERM GOAL #1   Title pain with walking decreased >/= 25%   Time 4   Period Weeks   Status New     PT SHORT TERM GOAL #2   Title ------           PT Long Term Goals - 01/15/16 1359      PT LONG TERM GOAL #1   Title indpendent with HEP   Time 8   Period Weeks   Status New     PT LONG TERM GOAL #2   Title pain with walking decreased >/= 75%   Time 8   Period Weeks   Status New     PT LONG TERM GOAL #3   Title ability to flex her right hip with right groin pain decreased >/= 50% due to improved tissue mobility   Time 8   Period Weeks   Status New     PT LONG TERM GOAL #4   Title -------           Long Term Clinic Goals - 01/11/16 1213       CC Long Term Goal  #2   Title Pt. will report swelling is adequately managed to enable ADL function at a consistent level.   Status On-going     CC Long Term Goal  #4   Title Pain/discomfort at right axilla area will be controlled at 6/10 or less.   Status On-going     CC Long Term Goal  #5   Title Patient will avoid infection by ongoing management of her lymphedema at right breast/axilla/upper arm areas.   Status On-going     CC Long Term Goal  #6   Title Pain in right groin/ischial tuberosity area will be controlled at 3/10 level or less to enable walking with less pain.   Status On-going            Plan - 01/17/16 1046    Clinical Impression Statement Patient has trigger points in the pelvic floor that were released during treatment.  Patient able to walk without waddling and looks stronger while walking.  Patient had decreased tenderness after therapy.  Patient will benefit from skilled therapy to reduce muscle.   pelvis   Rehab Potential Good  pelvis   Clinical Impairments Affecting Rehab Potential active cancer  pelvis   PT Frequency 2x / week  pelvis   PT Duration 8 weeks  pelvis   PT Treatment/Interventions Manual techniques;Therapeutic exercise;Therapeutic activities  pelvis   PT Next Visit Plan check pelvic alignment; soft tissue work  pelvis   PT Home Exercise Plan progress as needed  pelvis   Consulted and Agree with Plan of Care Patient  pelvis      Patient will benefit from skilled therapeutic intervention in order to improve the following deficits and impairments:  Increased fascial restricitons, Increased muscle spasms, Pain (pelvis)  Visit Diagnosis: Other muscle spasm     Problem List Patient Active Problem List   Diagnosis Date Noted  . 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 Zoster 04/04/2015  . Nausea with vomiting 11/18/2014  . Constipation 11/18/2014  . Left-sided thoracic back pain   . Bone metastases ([place]) 11/16/2014  . Back  pain 11/15/2014  . Uncontrolled pain 11/14/2014  . 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 Chest wall pain 03/21/2014  . Abnormal LFTs (liver function tests) 09/12/2013  . Breast cancer of upper-inner quadrant of right female breast ([place]) 08/18/2013  . Secondary malignant  neoplasm of mediastinal lymph node ([place]) 08/18/2013    [name], PT 01/17/16 10:51 AM   [facility] [address], [place], [place], [address] Phone: [phone]   Fax:  [phone]  Name: [name] [name] MRN: [id number] Date of Birth: [birth year]

## 2016-01-18 ENCOUNTER — Ambulatory Visit: Payer: 59 | Admitting: Physical Therapy

## 2016-01-18 DIAGNOSIS — I890 Lymphedema, not elsewhere classified: Secondary | ICD-10-CM | POA: Diagnosis not present

## 2016-01-18 DIAGNOSIS — M62838 Other muscle spasm: Secondary | ICD-10-CM

## 2016-01-18 NOTE — Therapy (Signed)
[place], [place], [address] Phone: [phone]   Fax:  [phone]  Physical Therapy Treatment  Patient Details  Name: [name] [name] MRN: [id number] Date of Birth: 1960/04/26 Referring Provider: Dr. Kyung Rudd  Encounter Date: 01/18/2016      PT End of Session - 01/18/16 1213    Visit Number 115  82 for lymph   Number of Visits 95  for lymph   Date for PT Re-Evaluation 03/03/16  for lymph   Authorization - Visit Number 140   PT Start Time 1022   PT Stop Time 1108   PT Time Calculation (min) 46 min   Activity Tolerance Patient tolerated treatment well   Behavior During Therapy [facility] for tasks assessed/performed      Past Medical History:  Diagnosis Date  . Bone metastases ([place]) dx'd 05/2014  . Breast cancer ([place]) dx'd 2005/2011  . Peripheral vascular disease ([place]) 02/2010   blood clot related to porta cath  . PONV (postoperative nausea and vomiting)   . S/P radiation therapy 07/17/2014 through 08/02/2014    Left mediastinum, left seventh rib 3250 cGy in 13 sessions   . S/P radiation therapy 12/11/2014 through 12/22/2014    Left parietal calvarium 2400 cGy in 8 sessions   . Seizures ([place]) 2010   Isolated incident.    Past Surgical History:  Procedure Laterality Date  . AXILLARY LYMPH NODE DISSECTION  Dec. 2011  . BREAST LUMPECTOMY  2005  . MEDIASTINOTOMY CHAMBERLAIN MCNEIL Left 06/02/2013   Procedure: MEDIASTINOTOMY CHAMBERLAIN MCNEIL;  Surgeon: [name], MD;  Location: [facility];  Service: Thoracic;  Laterality: Left;  LEFT ANTERIOR MEDIASTINOTOMY   . PORTACATH PLACEMENT  12/11  . removal portacath      There were no vitals filed for this visit.      Subjective Assessment - 01/18/16 1023    Subjective "Don't mind me  if I cry out in pain." Reports constipation and having just had injections this week.   Currently in Pain? Yes   Pain Score 6    Pain Location Axilla   Pain Orientation Right   Pain Descriptors / Indicators --  fullness   Pain Score 2   Pain Location Groin   Pain Orientation Right                         [facility] Adult PT Treatment/Exercise - 01/18/16 0001      Manual Therapy   Myofascial Release right axilla with focus on scar tightness   Manual Lymphatic Drainage (MLD) In left sidelying, posterior interaxillary anastomosis and right axillo-inguinal anastomosis; in supine, short neck, left axilla and anterior interaxillary anastomosis, right groin and axillo-inguinal anastomosis, and right upper extremity from fingers to shoulder.  In right sidelying, left periscapular area toward left groin.   Other Manual Therapy soft tissue work at superior aspect of right medial and upper thigh in prone position for pain relief and muscle relaxation, gentle myofascial release at upper thigh                PT Education - 01/17/16 1044    Education provided No          PT Short Term Goals - 01/15/16 1401      PT SHORT TERM GOAL #1   Title pain with walking decreased >/= 25%   Time 4   Period Weeks   Status New     PT  SHORT TERM GOAL #2   Title ------           PT Long Term Goals - 01/15/16 1359      PT LONG TERM GOAL #1   Title indpendent with HEP   Time 8   Period Weeks   Status New     PT LONG TERM GOAL #2   Title pain with walking decreased >/= 75%   Time 8   Period Weeks   Status New     PT LONG TERM GOAL #3   Title ability to flex her right hip with right groin pain decreased >/= 50% due to improved tissue mobility   Time 8   Period Weeks   Status New     PT LONG TERM GOAL #4   Title -------           [facility] Goals - 01/18/16 1218      CC Long Term Goal  #2   Title Pt. will report swelling is adequately managed to enable  ADL function at a consistent level.   Status On-going     CC Long Term Goal  #4   Title Pain/discomfort at right axilla area will be controlled at 6/10 or less.   Status On-going     CC Long Term Goal  #5   Title Patient will avoid infection by ongoing management of her lymphedema at right breast/axilla/upper arm areas.   Status On-going     CC Long Term Goal  #6   Title Pain in right groin/ischial tuberosity area will be controlled at 3/10 level or less to enable walking with less pain.   Status On-going            Plan - 01/18/16 1215    Clinical Impression Statement Continues to have fullness at right upper outer breast and UE that benefits from manual lymph drainage; pain in right thigh/groin area improves with myofascial release and soft tissue work.   Rehab Potential Good   Clinical Impairments Affecting Rehab Potential active cancer   PT Frequency 2x / week   PT Duration 12 weeks   PT Treatment/Interventions Manual lymph drainage;Manual techniques   PT Next Visit Plan continue manual lymph drainage, myofascial release, and soft tissue work   Consulted and Agree with Plan of Care Patient   PT Plan Continue manual lymph drainage, soft tissue work, myofascial release.      Patient will benefit from skilled therapeutic intervention in order to improve the following deficits and impairments:  Increased edema, Increased fascial restricitons, Pain  Visit Diagnosis: Other muscle spasm  Lymphedema, not elsewhere classified     Problem List Patient Active Problem List   Diagnosis Date Noted  . 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 Zoster 04/04/2015  . Nausea with vomiting 11/18/2014  . Constipation 11/18/2014  . Left-sided thoracic back pain   . Bone metastases ([place]) 11/16/2014  . Back pain 11/15/2014  . Uncontrolled pain 11/14/2014  . 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 Chest wall pain 03/21/2014  . Abnormal LFTs (liver function tests)  09/12/2013  . Breast cancer of upper-inner quadrant of right female breast ([place]) 08/18/2013  . 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

## 2016-01-21 ENCOUNTER — Ambulatory Visit: Payer: 59 | Admitting: Physical Therapy

## 2016-01-21 ENCOUNTER — Other Ambulatory Visit (HOSPITAL_BASED_OUTPATIENT_CLINIC_OR_DEPARTMENT_OTHER): Payer: 59

## 2016-01-21 ENCOUNTER — Encounter: Payer: Self-pay | Admitting: Physical Therapy

## 2016-01-21 DIAGNOSIS — M62838 Other muscle spasm: Secondary | ICD-10-CM

## 2016-01-21 DIAGNOSIS — I890 Lymphedema, not elsewhere classified: Secondary | ICD-10-CM | POA: Diagnosis not present

## 2016-01-21 DIAGNOSIS — C50211 Malignant neoplasm of upper-inner quadrant of right female breast: Secondary | ICD-10-CM | POA: Diagnosis not present

## 2016-01-21 DIAGNOSIS — C771 Secondary and unspecified malignant neoplasm of intrathoracic lymph nodes: Secondary | ICD-10-CM | POA: Diagnosis not present

## 2016-01-21 DIAGNOSIS — M79651 Pain in right thigh: Secondary | ICD-10-CM

## 2016-01-21 DIAGNOSIS — J90 Pleural effusion, not elsewhere classified: Secondary | ICD-10-CM

## 2016-01-21 LAB — CBC WITH DIFFERENTIAL/PLATELET
BASO%: 3.3 % — ABNORMAL HIGH (ref 0.0–2.0)
Basophils Absolute: 0.1 10*3/uL (ref 0.0–0.1)
EOS ABS: 0.1 10*3/uL (ref 0.0–0.5)
EOS%: 2.6 % (ref 0.0–7.0)
HEMATOCRIT: 33.5 % — AB (ref 34.8–46.6)
HGB: 12 g/dL (ref 11.6–15.9)
LYMPH#: 0.5 10*3/uL — AB (ref 0.9–3.3)
LYMPH%: 17 % (ref 14.0–49.7)
MCH: 37 pg — ABNORMAL HIGH (ref 25.1–34.0)
MCHC: 35.8 g/dL (ref 31.5–36.0)
MCV: 103.4 fL — ABNORMAL HIGH (ref 79.5–101.0)
MONO#: 0.2 10*3/uL (ref 0.1–0.9)
MONO%: 7.2 % (ref 0.0–14.0)
NEUT%: 69.9 % (ref 38.4–76.8)
NEUTROS ABS: 2.1 10*3/uL (ref 1.5–6.5)
NRBC: 0 % (ref 0–0)
Platelets: 188 10*3/uL (ref 145–400)
RBC: 3.24 10*6/uL — AB (ref 3.70–5.45)
RDW: 12.9 % (ref 11.2–14.5)
WBC: 3.1 10*3/uL — AB (ref 3.9–10.3)

## 2016-01-21 NOTE — Therapy (Signed)
[place], [place], [address] Phone: [phone]   Fax:  [phone]  Physical Therapy Treatment  Patient Details  Name: [name] [name] MRN: [id number] Date of Birth: May 05, 1960 Referring Provider: Dr. Kyung Rudd  Encounter Date: 01/21/2016      PT End of Session - 01/21/16 1257    Visit Number 116  83 for lymph   Number of Visits 95  for lymph   Date for PT Re-Evaluation 03/03/16  for lymph   Authorization - Visit Number 140   PT Start Time (ACUTE ONLY) 1013   PT Stop Time (ACUTE ONLY) 1100   PT Time Calculation (min) (ACUTE ONLY) 47 min   Activity Tolerance Patient tolerated treatment well   Behavior During Therapy [facility] for tasks assessed/performed      Past Medical History:  Diagnosis Date  . Bone metastases ([place]) dx'd 05/2014  . Breast cancer ([place]) dx'd 2005/2011  . Peripheral vascular disease ([place]) 02/2010   blood clot related to porta cath  . PONV (postoperative nausea and vomiting)   . S/P radiation therapy 07/17/2014 through 08/02/2014    Left mediastinum, left seventh rib 3250 cGy in 13 sessions   . S/P radiation therapy 12/11/2014 through 12/22/2014    Left parietal calvarium 2400 cGy in 8 sessions   . Seizures ([place]) 2010   Isolated incident.    Past Surgical History:  Procedure Laterality Date  . AXILLARY LYMPH NODE DISSECTION  Dec. 2011  . BREAST LUMPECTOMY  2005  . MEDIASTINOTOMY CHAMBERLAIN MCNEIL Left 06/02/2013   Procedure: MEDIASTINOTOMY CHAMBERLAIN MCNEIL;  Surgeon: [name], MD;  Location: [facility];  Service: Thoracic;  Laterality: Left;  LEFT ANTERIOR MEDIASTINOTOMY   . PORTACATH PLACEMENT  12/11  . removal portacath      There were no vitals filed for this visit.      Subjective Assessment -  01/21/16 1228    Subjective I was able to walk 1.5 miles 3 days in a row.  Today more pain. Urine stream seems wider and have burning.   pelvis   Pertinent History patient vomited just prior to treatment today   How long can you walk comfortably? more she walks patient will have increased pain that stops her from walking further  pelvis   Diagnostic tests x rays of back show no fractures;    Patient Stated Goals walk 20 minutes with minimal limp  pelvis   Currently in Pain? Yes  pelvis   Pain Score 0-No pain  pelvis   Pain Location Perineum  pelvis   Pain Orientation Mid  pelvis   Pain Descriptors / Indicators Burning  pelvis   Pain Onset More than a month ago  pelvis   Pain Frequency Constant  pelvis   Aggravating Factors  walking  pelvis   Pain Relieving Factors soft tissue work  pelvis   Multiple Pain Sites No                      Pelvic Floor Special Questions - 01/21/16 0001    Pelvic Floor Internal Exam Patient approves physical therapist to perform pelvic floor muscle assessment   Exam Type Vaginal           [facility] Adult PT Treatment/Exercise - 01/21/16 1257      Manual Therapy   Myofascial Release right axilla with focus on scar tightness   Manual Lymphatic Drainage (MLD) In left sidelying, posterior interaxillary anastomosis and right  axillo-inguinal anastomosis; in supine, short neck, left axilla and anterior interaxillary anastomosis, right groin and axillo-inguinal anastomosis, and right upper extremity from fingers to shoulder.  In right sidelying, left periscapular area toward left groin.   Other Manual Therapy soft tissue work at superior aspect of right medial and upper thigh in prone position for pain relief and muscle relaxation, gentle myofascial release at upper thigh                PT Education - 01/21/16 1255    Education provided No          PT Short Term Goals - 01/15/16 1401      PT SHORT TERM GOAL #1   Title pain  with walking decreased >/= 25%   Time 4   Period Weeks   Status New     PT SHORT TERM GOAL #2   Title ------           PT Long Term Goals - 01/15/16 1359      PT LONG TERM GOAL #1   Title indpendent with HEP   Time 8   Period Weeks   Status New     PT LONG TERM GOAL #2   Title pain with walking decreased >/= 75%   Time 8   Period Weeks   Status New     PT LONG TERM GOAL #3   Title ability to flex her right hip with right groin pain decreased >/= 50% due to improved tissue mobility   Time 8   Period Weeks   Status New     PT LONG TERM GOAL #4   Title -------           [facility] Goals - 01/18/16 1218      CC Long Term Goal  #2   Title Pt. will report swelling is adequately managed to enable ADL function at a consistent level.   Status On-going     CC Long Term Goal  #4   Title Pain/discomfort at right axilla area will be controlled at 6/10 or less.   Status On-going     CC Long Term Goal  #5   Title Patient will avoid infection by ongoing management of her lymphedema at right breast/axilla/upper arm areas.   Status On-going     CC Long Term Goal  #6   Title Pain in right groin/ischial tuberosity area will be controlled at 3/10 level or less to enable walking with less pain.   Status On-going            Plan - 01/21/16 1258    Rehab Potential Good   Clinical Impairments Affecting Rehab Potential active cancer   PT Frequency 2x / week   PT Duration 12 weeks   PT Treatment/Interventions Manual lymph drainage;Manual techniques   PT Next Visit Plan continue manual lymph drainage, myofascial release, and soft tissue work   Consulted and Agree with Plan of Care Patient   PT Plan Doing a little better today both with lymphedema and with groin/thigh pain.      Patient will benefit from skilled therapeutic intervention in order to improve the following deficits and impairments:  Increased edema, Increased fascial restricitons, Pain  Visit  Diagnosis: Lymphedema, not elsewhere classified  Pain in right thigh     Problem List Patient Active Problem List   Diagnosis Date Noted  . 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 Zoster 04/04/2015  . Nausea with vomiting 11/18/2014  .  Constipation 11/18/2014  . Left-sided thoracic back pain   . Bone metastases ([place]) 11/16/2014  . Back pain 11/15/2014  . Uncontrolled pain 11/14/2014  . 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 Chest wall pain 03/21/2014  . Abnormal LFTs (liver function tests) 09/12/2013  . Breast cancer of upper-inner quadrant of right female breast ([place]) 08/18/2013  . 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

## 2016-01-21 NOTE — Therapy (Signed)
[facility] [address] [place], [place], [address] Phone: [phone]   Fax:  [phone]  Physical Therapy Treatment  Patient Details  Name: [name] [name] MRN: [id number] Date of Birth: February 28, 1961 Referring Provider: Dr. Kyung Rudd  Encounter Date: 01/21/2016      PT End of Session - 01/21/16 1257    Visit Number 119  41 for lymph   Number of Visits 95  for lymph   Date for PT Re-Evaluation 03/03/16  for lymph   Authorization - Visit Number 140   PT Start Time (ACUTE ONLY) 1013   PT Stop Time (ACUTE ONLY) 1100   PT Time Calculation (min) (ACUTE ONLY) 47 min   Activity Tolerance Patient tolerated treatment well   Behavior During Therapy [facility] for tasks assessed/performed      Past Medical History:  Diagnosis Date  . Bone metastases ([place]) dx'd 05/2014  . Breast cancer ([place]) dx'd 2005/2011  . Peripheral vascular disease ([place]) 02/2010   blood clot related to porta cath  . PONV (postoperative nausea and vomiting)   . S/P radiation therapy 07/17/2014 through 08/02/2014    Left mediastinum, left seventh rib 3250 cGy in 13 sessions   . S/P radiation therapy 12/11/2014 through 12/22/2014    Left parietal calvarium 2400 cGy in 8 sessions   . Seizures ([place]) 2010   Isolated incident.    Past Surgical History:  Procedure Laterality Date  . AXILLARY LYMPH NODE DISSECTION  Dec. 2011  . BREAST LUMPECTOMY  2005  . MEDIASTINOTOMY CHAMBERLAIN MCNEIL Left 06/02/2013   Procedure: MEDIASTINOTOMY CHAMBERLAIN MCNEIL;  Surgeon: [name], MD;  Location: [facility];  Service: Thoracic;  Laterality: Left;  LEFT ANTERIOR MEDIASTINOTOMY   . PORTACATH PLACEMENT  12/11  . removal portacath      There were no vitals filed for this visit.      Subjective Assessment  - 01/21/16 1228    Subjective I was able to walk 1.5 miles 3 days in a row.  Today more pain. Urine stream seems wider and have burning.   pelvis   Pertinent History patient vomited just prior to treatment today   How long can you walk comfortably? more she walks patient will have increased pain that stops her from walking further  pelvis   Diagnostic tests x rays of back show no fractures;    Patient Stated Goals walk 20 minutes with minimal limp  pelvis   Currently in Pain? Yes  pelvis   Pain Score 0-No pain  pelvis   Pain Location Perineum  pelvis   Pain Orientation Mid  pelvis   Pain Descriptors / Indicators Burning  pelvis   Pain Onset More than a month ago  pelvis   Pain Frequency Constant  pelvis   Aggravating Factors  walking  pelvis   Pain Relieving Factors soft tissue work  pelvis   Multiple Pain Sites No                      Pelvic Floor Special Questions - 01/21/16 0001    Pelvic Floor Internal Exam Patient approves physical therapist to perform pelvic floor muscle assessment   Exam Type Vaginal           [facility] Adult PT Treatment/Exercise - 01/21/16 1257      Manual Therapy   Myofascial Release right axilla with focus on scar tightness   Manual Lymphatic Drainage (MLD) In left sidelying, posterior interaxillary anastomosis  and right axillo-inguinal anastomosis; in supine, short neck, left axilla and anterior interaxillary anastomosis, right groin and axillo-inguinal anastomosis, and right upper extremity from fingers to shoulder.  In right sidelying, left periscapular area toward left groin.   Other Manual Therapy soft tissue work at superior aspect of right medial and upper thigh in prone position for pain relief and muscle relaxation, gentle myofascial release at upper thigh                PT Education - 01/21/16 1255    Education provided No          PT Short Term Goals - 01/21/16 1303      PT SHORT TERM GOAL #1   Title  pain with walking decreased >/= 25%   Time 4   Period Weeks   Status On-going           PT Long Term Goals - 01/15/16 1359      PT LONG TERM GOAL #1   Title indpendent with HEP   Time 8   Period Weeks   Status New     PT LONG TERM GOAL #2   Title pain with walking decreased >/= 75%   Time 8   Period Weeks   Status New     PT LONG TERM GOAL #3   Title ability to flex her right hip with right groin pain decreased >/= 50% due to improved tissue mobility   Time 8   Period Weeks   Status New     PT LONG TERM GOAL #4   Title -------           [facility] Goals - 01/18/16 1218      CC Long Term Goal  #2   Title Pt. will report swelling is adequately managed to enable ADL function at a consistent level.   Status On-going     CC Long Term Goal  #4   Title Pain/discomfort at right axilla area will be controlled at 6/10 or less.   Status On-going     CC Long Term Goal  #5   Title Patient will avoid infection by ongoing management of her lymphedema at right breast/axilla/upper arm areas.   Status On-going     CC Long Term Goal  #6   Title Pain in right groin/ischial tuberosity area will be controlled at 3/10 level or less to enable walking with less pain.   Status On-going            Plan - 01/21/16 1259    Clinical Impression Statement Patient has increased pain on the left and her urine is more spread out. No palpable tendernss located on the right side but increased on left.  Patient able [place] walk 1.[place]. No goals met at this time. Patient will benefit from skilled therapy to redue pain.    Rehab Potential Good  pelvis   Clinical Impairments Affecting Rehab Potential active cancer   PT Frequency 2x / week  pelvis   PT Duration 8 weeks  pelvis   PT Treatment/Interventions Manual techniques;Therapeutic exercise;Therapeutic activities  pelvis   PT Next Visit Plan continue with soft tissue work  pelvis   PT Home Exercise Plan  progress as needed  pelvis      Patient will benefit from skilled therapeutic intervention in order to improve the following deficits and impairments:  Increased edema, Increased fascial restricitons, Pain (pelvis)  Visit Diagnosis: Other muscle spasm  Problem List Patient Active Problem List   Diagnosis Date Noted  . 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 Zoster 04/04/2015  . Nausea with vomiting 11/18/2014  . Constipation 11/18/2014  . Left-sided thoracic back pain   . Bone metastases ([place]) 11/16/2014  . Back pain 11/15/2014  . Uncontrolled pain 11/14/2014  . Post-lymphadenectomy lymphedema of arm 05/31/2014  . Chest wall pain 03/21/2014  . Abnormal LFTs (liver function tests) 09/12/2013  . Breast cancer of upper-inner quadrant of right female breast ([place]) 08/18/2013  . 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 [place], [place], [address] Phone: [phone]   Fax:  [phone]  Name: [name] [name] MRN: [id number] Date of Birth: 05-May-1960

## 2016-01-22 ENCOUNTER — Ambulatory Visit (HOSPITAL_BASED_OUTPATIENT_CLINIC_OR_DEPARTMENT_OTHER): Payer: 59 | Admitting: Oncology

## 2016-01-22 DIAGNOSIS — C7951 Secondary malignant neoplasm of bone: Secondary | ICD-10-CM | POA: Diagnosis not present

## 2016-01-22 DIAGNOSIS — C771 Secondary and unspecified malignant neoplasm of intrathoracic lymph nodes: Secondary | ICD-10-CM | POA: Diagnosis not present

## 2016-01-22 DIAGNOSIS — C50211 Malignant neoplasm of upper-inner quadrant of right female breast: Secondary | ICD-10-CM | POA: Diagnosis not present

## 2016-01-22 DIAGNOSIS — Z170 Estrogen receptor positive status [ER+]: Secondary | ICD-10-CM

## 2016-01-22 NOTE — Progress Notes (Signed)
[place]  Telephone:(336) [phone] Fax:(336) [phone]     ID: [name] [name] OB: 1960/08/21  MR#: [id number]  JYN#:[id number]  PCP: Pcp Not In System GYN:  [name] [name] SU:  OTHER MD: [name] [name], [name] [name], [name] [name], [name] [name], [name] [name], [name] [name]   CHIEF COMPLAINT: Stage IV breast cancer  CURRENT TREATMENT: Fulvestrant, denosumab, palbociclib  BREAST CANCER HISTORY: From doctor [name] [name]'s intake note 03/20/2004:  "The patient is a very pleasant 55 year old female, without significant past medical history.  Her family history is significant for a sister who at age 77 was diagnosed with invasive ductal carcinoma.  She is a breast cancer survivor at age 52 now.  The patient states that she has never really had a screening mammogram until October 2005, when she felt that it was time for her to start having mammograms done on a yearly basis.  Therefore, on 01/26/04, she underwent a screening mammogram and an abnormality was detected in the upper outer right breast.  She, therefore, underwent spot compression views of both the right and the left breast.  The left breast revealed a well-defined mass in the upper outer left quadrant, present at the 2 o'clock position, measuring 1.8 cm, 6 cm from the nipple.  This, by ultrasound, was felt to be a simple cyst measuring 1.8 cm.  On the right breast, a spiculated mass was noted in the upper outer right quadrant.  The ultrasound revealed a shadowing irregular solid mass at the 10:30 position, 9 cm from the nipple, measuring 1.2 cm in greatest dimension, correlating with the spiculated mass seen on the mammogram.  The right axilla was negative ultrasonically.  Because of this, the patient underwent a needle biopsy of the right breast and the biopsy was positive invasive mammary carcinoma that showed features consistent with a high-grade invasive ductal carcinoma associated with desmoplastic  stroma.  No in situ component was seen and no definite lymphovascular invasion was identified.  On the core biopsy, the tumor measured about 0.8 cm.  Because of this, she was seen by [name] [name] and the patient was taken to the [facility] on March 15, 2004.  She underwent a right breast lumpectomy with sentinel node biopsy.  The final pathology revealed an invasive ductal carcinoma, measuring 1.7 cm, grade 2 of 3.  Margins were free of tumor.  Atypical lobular hyperplasia was noted.  One sentinel node was removed which was negative for metastatic disease.  The tumor was staged at T1c, N0 MX.  It was estrogen receptor positive, progesterone receptor positive.  HER-2/neu was 2+.  FISH was negative.  All margins were free of tumor.  She is now seen in Medical Oncology for further evaluation and management of this newly diagnosed T1c, node negative, stage I, invasive ductal carcinoma of the right breast."  Her subsequent history is as detailed below  INTERVAL HISTORY:   [name] [name] returns today for follow-up of her metastatic breast cancer accompanied by her husband [name] [name]. The interval history is dominated by the significant rise in her CA-27-29. She just completed a course of radiation to the pelvis and it is unclear how that may be related.  In the meantime she continues on fulvestrant and denosumab/Xgeva every 4 weeks, together with palbociclib at 75 mg daily 21 days on 7 days off. This is currently her "off" week on the polyp palbociclib.  REVIEW OF SYiSTEMS: [name] feels the radiation made a significant change to her pelvic pain. She can  now walk a mile and a half without a walking stick and at a good pace. What she feels in the pelvis now is more like a "blockage" at the base. She tolerates the Ibrance well with mild fatigue as the main side effect. She does have some constipation and has had some bright red blood on the tissue. Her urethra is also still inflamed from the recent radiation. Aside from these  issues, she is planning a trip between 01/22/2016 and 01/29/2016 to visit family. A detailed review of systems was otherwise benign  PAST MEDICAL HISTORY: Past Medical History:  Diagnosis Date  . Bone metastases ([place]) dx'd 05/2014  . Breast cancer ([place]) dx'd 2005/2011  . Peripheral vascular disease ([place]) 02/2010   blood clot related to porta cath  . PONV (postoperative nausea and vomiting)   . S/P radiation therapy 07/17/2014 through 08/02/2014    Left mediastinum, left seventh rib 3250 cGy in 13 sessions   . S/P radiation therapy 12/11/2014 through 12/22/2014    Left parietal calvarium 2400 cGy in 8 sessions   . Seizures ([place]) 2010   Isolated incident.    PAST SURGICAL HISTORY: Past Surgical History:  Procedure Laterality Date  . AXILLARY LYMPH NODE DISSECTION  Dec. 2011  . BREAST LUMPECTOMY  2005  . MEDIASTINOTOMY CHAMBERLAIN MCNEIL Left 06/02/2013   Procedure: MEDIASTINOTOMY CHAMBERLAIN MCNEIL;  Surgeon: [name] Nakayama, MD;  Location: [facility];  Service: Thoracic;  Laterality: Left;  LEFT ANTERIOR MEDIASTINOTOMY   . PORTACATH PLACEMENT  12/11  . removal portacath      FAMILY HISTORY Family History  Problem Relation Age of Onset  . COPD Mother   . Breast cancer Sister 39   The patient's father is living, 9 years old as of may 2015. He lives in [place]. The patient's mother died from complications of COPD at the age of 85. These has 2 brothers, one sister. Her sister developed breast cancer at the age of 7. She is doing well. The patient herself underwent genetic testing at [facility] in 2011 and was found to be BRCA negative  GYNECOLOGIC HISTORY:  Menarche age 89, she is GX P0. She stopped having periods with her initial chemotherapy in 2006.  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 Her husband [name] [name] is a Medical illustrator in [place]. He has a child from a prior marriage. At home they have 2 rescue dogs, Hobo and [name]. The patient is religious but not a church attender    ADVANCED DIRECTIVES: In place; at the 08/04/2014 visit in particular the patient was very clear, with her husband present, that she would not want any kind of feeding tubes or "other tubes" if her condition deteriorated.   HEALTH MAINTENANCE: Social History  Substance Use Topics  . Smoking status: Never Smoker  . Smokeless tobacco: Never Used  . Alcohol use No     Colonoscopy:  PAP:  Bone density: March 2015; mild osteopenia  Lipid panel:  Allergies  Allergen Reactions  . 2nd Skin Quick Heal Other (See Comments)    Other Reaction: Skin peels  . Decadron [Dexamethasone] Other (See Comments)    Patient does not tolerate steroids.   . Dilaudid [Hydromorphone] Nausea And Vomiting  . Enoxaparin Other (See Comments)    unknown  . Fluconazole Swelling    Liver toxicity  . Hydromorphone Hcl Nausea And Vomiting  . Morphine And Related Nausea And Vomiting  .  Protonix [Pantoprazole Sodium] Other (See Comments)    Patient reports it caused thrush.  . Tegaderm Ag Mesh [Silver]     Current Outpatient Prescriptions  Medication Sig Dispense Refill  . ALPRAZolam (XANAX) 0.5 MG tablet Take 1 tablet (0.5 mg total) by mouth 2 (two) times daily as needed for anxiety. 30 tablet 0  . B Complex-C (B-COMPLEX WITH VITAMIN C) tablet Take 1 tablet by mouth daily. Reported on 03/27/2015    . calcium carbonate (TUMS - DOSED IN MG ELEMENTAL CALCIUM) 500 MG chewable tablet Chew 1 tablet by mouth as directed.    . cholecalciferol 2000 UNITS tablet Take 1 tablet (2,000 Units total) by mouth daily.    . ciprofloxacin (CIPRO) 500 MG tablet Take 1 tablet (500 mg total) by mouth 2 (two) times daily. 6 tablet 0  . Diphenhyd-Hydrocort-Nystatin (FIRST-DUKES MOUTHWASH) SUSP 5-10 ml qid SWISH  AND SPIT 638 mL 3  . folic acid (FOLVITE) 1 MG tablet Take 1 tablet (1 mg total) by mouth daily.    Marland Kitchen loratadine-pseudoephedrine (CLARITIN-D 24-HOUR) 10-240 MG 24 hr tablet Take 1 tablet by mouth daily.    . Melatonin 3 MG TABS Take 3 mg by mouth at bedtime.    . naproxen sodium (ANAPROX) 220 MG tablet Take 220 mg by mouth 2 (two) times daily with a meal.    . palbociclib (IBRANCE) 75 MG capsule Take 1 capsule (75 mg total) by mouth daily with breakfast. Take whole with food. 21 capsule 6  . phenazopyridine (PYRIDIUM) 200 MG tablet Take 1 tablet (200 mg total) by mouth 3 (three) times daily as needed for pain. 30 tablet 1  . RABEprazole Sodium 5 MG CPSP Take 5 mg by mouth daily. 30 capsule 3  . saccharomyces boulardii (FLORASTOR) 250 MG capsule Take 250 mg by mouth daily.      No current facility-administered medications for this visit.     OBJECTIVE: Middle-aged white woman in no acute distress  There were no vitals filed for this visit.   There is no height or weight on file to calculate BMI.   There were no vitals filed for this visit.   Patient refused vitals today 01/22/2016     ECOG FS: 1  Sclerae unicteric, pupils round and equal Oropharynx clear and moist-- no thrush or other lesions No cervical or supraclavicular adenopathy Lungs no rales or rhonchi Heart regular rate and rhythm Abd soft, nontender, positive bowel sounds MSK no focal spinal tenderness, no upper extremity lymphedema Neuro: nonfocal, well oriented, appropriate affect Breasts: Deferred    LAB RESULTS:   CMP     Component Value Date/Time   NA 133 (L) 01/14/2016 1146   K 4.2 01/14/2016 1146   CL 103 12/14/2014 0800   CL 105 05/06/2012 1333   CO2 25 01/14/2016 1146   GLUCOSE 78 01/14/2016 1146   GLUCOSE 124 (H) 05/06/2012 1333   BUN 10.5 01/14/2016 1146   CREATININE 0.8 01/14/2016 1146   CALCIUM 9.6 01/14/2016 1146   PROT 7.5 01/14/2016 1146   ALBUMIN 4.0 01/14/2016 1146   AST 29 01/14/2016 1146    ALT 33 01/14/2016 1146   ALKPHOS 71 01/14/2016 1146   BILITOT 0.31 01/14/2016 1146   GFRNONAA >60 12/14/2014 0800   GFRAA >60 12/14/2014 0800    No results found for: SPEP  Lab Results  Component Value Date   WBC 3.1 (L) 01/21/2016   NEUTROABS 2.1 01/21/2016   HGB 12.0 01/21/2016   HCT 33.5 (L) 01/21/2016  MCV 103.4 (H) 01/21/2016   PLT 188 01/21/2016      Chemistry      Component Value Date/Time   NA 133 (L) 01/14/2016 1146   K 4.2 01/14/2016 1146   CL 103 12/14/2014 0800   CL 105 05/06/2012 1333   CO2 25 01/14/2016 1146   BUN 10.5 01/14/2016 1146   CREATININE 0.8 01/14/2016 1146      Component Value Date/Time   CALCIUM 9.6 01/14/2016 1146   ALKPHOS 71 01/14/2016 1146   AST 29 01/14/2016 1146   ALT 33 01/14/2016 1146   BILITOT 0.31 01/14/2016 1146     No results for input(s): INR in the last 168 hours.  Urinalysis    Component Value Date/Time   COLORURINE YELLOW 11/17/2014 0143   APPEARANCEUR CLOUDY (A) 11/17/2014 0143   LABSPEC 1.005 01/02/2016 1127   PHURINE 7.0 01/02/2016 1127   PHURINE 6.5 11/17/2014 0143   GLUCOSEU Negative 01/02/2016 1127   HGBUR Trace 01/02/2016 1127   HGBUR NEGATIVE 11/17/2014 0143   BILIRUBINUR Negative 01/02/2016 1127   KETONESUR Negative 01/02/2016 1127   KETONESUR NEGATIVE 11/17/2014 0143   PROTEINUR Negative 01/02/2016 1127   PROTEINUR NEGATIVE 11/17/2014 0143   UROBILINOGEN 0.2 01/02/2016 1127   NITRITE Negative 01/02/2016 1127   NITRITE NEGATIVE 11/17/2014 0143   LEUKOCYTESUR Trace 01/02/2016 1127   Results for TREVOR, DUTY (MRN [id number]) as of 01/22/2016 18:48  Ref. Range 09/24/2015 11:52 10/22/2015 11:49 11/19/2015 11:46 12/17/2015 11:30 01/14/2016 11:47  CA 27.29 Latest Ref Range: 0.0 - 38.6 U/mL 1,571.0 (H) 1,748.2 (H) 1,572.2 (H) 1,754.1 (H) 2,432.2 (H)    STUDIES: No results found.  ASSESSMENT: 55 y.o. BRCA negative [place] woman with stage IV breast cancer, history as follows  (1)  S/p Right  upper inner quadrant lumpectomy and sentinel lymph node sampling 03/15/2004 for a pT1c pN0. Stage IA invasive ductal carcinoma, grade 2, estrogen receptor 95% positive, progesterone receptor 65% positive, HER-2 not amplified; additional surgery 04/25/2004 for seroma or clearance showed no residual tumor  (2) adjuvant chemotherapy with cyclophosphamide and doxorubicin every 21 days x4 completed 07/19/2004  (3) adjuvant radiation given under Dr. Donella Stade in [place] completed July 2006  (4) the patient opted against adjuvant antiestrogen therapy  (5) genetics testing showed no BRCA mutations  (6) biopsy of a palpable right axillary mass 10/24/2009 showed invasive ductal carcinoma, grade 3, estrogen receptor 100% positive, progesterone receptor 2% positive (alert score 5) HER-2 negative; no evidence of systemic disease on PET scanning  (7) completed 3 of 4 planned cycles of docetaxel and cyclophosphamide September 2011, fourth cycle omitted because of marked elevations in liver function tests  (8) an right axillary lymph node dissection 03/06/2010 showed 3/8 lymph nodes removed to be involved by tumor, with extracapsular extension.  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, TTS-1 negative), estrogen receptor 80% positive, progesterone receptor 2% positive, HER-2 not amplified  (12) letrozole started March 2015-- tolerated with significant side effects, discontinued at the end of May 2015  (13) PET scan 08/16/2013 shows extensive left pleural metastatic disease and a large left pleural effusion that  shifts cardiac and mediastinal structures to the right; adenopathy (celiac  trunk, periadrenal, periaortic); and a left medial clavicular lesion; Status post left thoracentesis 08/16/2013 positive for adenocarcinoma, estrogen receptor positive, progesterone receptor negative.  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; everolimus was suggested as an available targeted agent  (18) exemestane started 03/31/2014, discontinued 10/31/2014 with evidence of progression  (a) everolimus added 04/03/2014 but not tolerated (cytopenias, elevated LFTs) even at minimal doses; stopped 04/17/2014  (19) fulvestrant started 12/20/2014  (a) palbociclib added at very low dose 04/03/2015 (starting dose 75 mg weekly)  (b) palbociclib dose gradually increased to 75 mg daily, 21/7, as of May 2017  (20) liver biopsy 03/20/2015 confirms metastatic carcinoma, still estrogen receptor positive at 100%, progesterone receptor negative, HER-2 equivocal with a signals ratio 1.41, number per cell 4.50.   (a) repeat liver biopsy December 2017 might show further changes (HER-2 positivity)  (21) immunohistochemistry for mismatched repair protein mutations 03/20/2015 showed normal major and minor MMR proteins, with a very low probability of microsatellite instability [id number])  (22) adjuvant radiation 12/24/15-01/02/16 Site/dose:   1) Left T9 Rib / 24 Gy in 8 fx                         2) Right inferior pelvis/ 24 Gy in 8 fx  (23) consider pembrolizumab starting  02/29/2016 (obtained compassionate release from company)    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 treated with Lovenox for 5-6 months  (c) right upper extremity lymphedema--receiving physical therapy  (d) hepatic steatosis with chronically elevated LFTs as well as unusual hepatic sensitivity to chemotherapy  (e) osteopenia with the lowest T score -1.6 on bone density scan 06/20/2013  (i) on denosumab/ Xgeva Q28d  (f) radiation oncology (Dr Valere Dross) has reviewed prior radiation records in case there is further mediastinal involvement with dysphagia etc in which case palliative XRT could be considered  (a) radiation to left mediastinum/ left 7th rib 3250 cGy in 13 sessions04/18/2016 through 08/02/2014  (b) radiation to T11 area: 22 Gy in 7 sessions, last dose 11/27/2014  (c) radiation left parietal scalp region to be completed 12/22/2014  (d) radiation to sacral area completed 04/09/2015  (e) radiation to right inferior pelvis and left ninth rib (24 gray, 12/24/2015--01/02/2016)  (g) chest wall and perineal pain--improved post radiation treatments  (a) discussed celebrex/ carafate but holding off for now  (h) zoster diagnosed 04/04/2015-- on valacyclovir   PLAN: [name] [name] looks terrific clinically and has clearly benefited from the recent radiation treatments. She continues to tolerate the systemic therapy well, with no side effects from the fulvestrant and denosumab other the in the discomfort of dosing. She does well with the palpable palbociclib also, except for very mild nausea and mild to moderate fatigue  The problem of course is the rising CA-27-29. This may indicate disease progression. We are going to repeat this test on November 1 and also November 13. She'll see me November 21, with her "shots" the same day. She is also to be scheduled for a  liver MRI and  spinal MRI prior to that visit. We will then make a decision whether to proceed to a PET scan as well or wait until December as originally planned.  If there has been progression on the current treatment, we are ready to start pembrolizumab 02/29/2016. She has a good understanding of the possible toxicities, side effects and complications of this agent, which will be again review at her next visit with me.   Chauncey Cruel, MD   01/22/2016 6:47 PM

## 2016-01-23 ENCOUNTER — Ambulatory Visit: Payer: 59 | Admitting: Physical Therapy

## 2016-01-23 ENCOUNTER — Encounter: Payer: Self-pay | Admitting: Physical Therapy

## 2016-01-23 DIAGNOSIS — I890 Lymphedema, not elsewhere classified: Secondary | ICD-10-CM

## 2016-01-23 DIAGNOSIS — M62838 Other muscle spasm: Secondary | ICD-10-CM

## 2016-01-23 DIAGNOSIS — M79651 Pain in right thigh: Secondary | ICD-10-CM

## 2016-01-23 NOTE — Therapy (Signed)
[facility] [address], [name], [place], [address] Phone: [phone]   Fax:  [phone]  Physical Therapy Treatment  Patient Details  Name: [name] [name] MRN: [id number] Date of Birth: 25-Jul-1960 Referring Provider: Dr. Kyung Rudd  Encounter Date: 01/23/2016      PT End of Session - 01/23/16 1028    Visit Number 117  pelvis   Date for PT Re-Evaluation 03/11/16  pelvis   Authorization - Visit Number 140  pelvis   Authorization - Number of Visits 117  pelvis   PT Start Time 1015  pelvis   PT Stop Time 1055  pelvis   PT Time Calculation (min) 40 min   Activity Tolerance Patient tolerated treatment well   Behavior During Therapy [facility] for tasks assessed/performed      Past Medical History:  Diagnosis Date  . Bone metastases ([place]) dx'd 05/2014  . Breast cancer ([place]) dx'd 2005/2011  . Peripheral vascular disease ([place]) 02/2010   blood clot related to porta cath  . PONV (postoperative nausea and vomiting)   . S/P radiation therapy 07/17/2014 through 08/02/2014    Left mediastinum, left seventh rib 3250 cGy in 13 sessions   . S/P radiation therapy 12/11/2014 through 12/22/2014    Left parietal calvarium 2400 cGy in 8 sessions   . Seizures ([place]) 2010   Isolated incident.    Past Surgical History:  Procedure Laterality Date  . AXILLARY LYMPH NODE DISSECTION  Dec. 2011  . BREAST LUMPECTOMY  2005  . MEDIASTINOTOMY CHAMBERLAIN MCNEIL Left 06/02/2013   Procedure: MEDIASTINOTOMY CHAMBERLAIN MCNEIL;  Surgeon: [name], MD;  Location: [facility];  Service: Thoracic;  Laterality: Left;  LEFT ANTERIOR MEDIASTINOTOMY   . PORTACATH PLACEMENT  12/11  . removal portacath      There were no vitals filed for this visit.      Subjective Assessment -  01/23/16 1025    Subjective I saw Dr. Langley Gauss and agrees with my plan.  Last visit helped patient.  The urine stream is not right still. Constipation from radiaiton is getting worse.   pelvis   How long can you walk comfortably? more she walks patient will have increased pain that stops her from walking further  pelvis   Diagnostic tests x rays of back show no fractures;   pelvis   Patient Stated Goals walk 20 minutes with minimal limp  pelvis   Currently in Pain? Yes  pelvis   Pain Score 2   pelvis   Pain Location Perineum  pelvis   Pain Orientation Mid  pelvis   Pain Descriptors / Indicators Tightness  pelvis   Pain Type Chronic pain  pelvis   Pain Onset More than a month ago  pelvis   Pain Frequency Constant  pelvis   Aggravating Factors  walking  pelvis   Pain Relieving Factors soft tissue work  pelvis   Multiple Pain Sites No                      Pelvic Floor Special Questions - 01/23/16 0001    Pelvic Floor Internal Exam Patient approves physical therapist to perform pelvic floor muscle assessment   Exam Type Vaginal           [facility] Adult PT Treatment/Exercise - 01/23/16 0001      Manual Therapy   Manual Therapy Myofascial release;Internal Pelvic Floor   Soft tissue mobilization bil. sides of rectum   Internal Pelvic  Floor bil. side of ichiococcygeus, coccygeus, puborectalis with trigger point release; right side of puborectalis  hookly                PT Education - 01/23/16 1055    Education provided No          PT Short Term Goals - 01/21/16 1303      PT SHORT TERM GOAL #1   Title pain with walking decreased >/= 25%   Time 4   Period Weeks   Status On-going           PT Long Term Goals - 01/15/16 1359      PT LONG TERM GOAL #1   Title indpendent with HEP   Time 8   Period Weeks   Status New     PT LONG TERM GOAL #2   Title pain with walking decreased >/= 75%   Time 8   Period Weeks   Status New     PT  LONG TERM GOAL #3   Title ability to flex her right hip with right groin pain decreased >/= 50% due to improved tissue mobility   Time 8   Period Weeks   Status New     PT LONG TERM GOAL #4   Title -------           [facility] Goals - 01/18/16 1218      CC Long Term Goal  #2   Title Pt. will report swelling is adequately managed to enable ADL function at a consistent level.   Status On-going     CC Long Term Goal  #4   Title Pain/discomfort at right axilla area will be controlled at 6/10 or less.   Status On-going     CC Long Term Goal  #5   Title Patient will avoid infection by ongoing management of her lymphedema at right breast/axilla/upper arm areas.   Status On-going     CC Long Term Goal  #6   Title Pain in right groin/ischial tuberosity area will be controlled at 3/10 level or less to enable walking with less pain.   Status On-going            Plan - 01/23/16 1056    Clinical Impression Statement Patient is having issues of constipation.  Patient continues to have pain in the perineum.  Soft tissue work from last visit reduced patient pain for several days. Patient had tightness around bil. sides of the rectum.  Patient will benefit from skilled therapy to reduce pain.    Rehab Potential Good  pelvis   Clinical Impairments Affecting Rehab Potential --  pelvis   PT Frequency 2x / week  pelvis   PT Duration 8 weeks  pelvis   PT Treatment/Interventions Manual techniques;Therapeutic exercise;Therapeutic activities  pelvis   PT Next Visit Plan continue with soft tissue work  pelvis   PT Home Exercise Plan progress as needed  pelvis   Consulted and Agree with Plan of Care Patient  pelvis      Patient will benefit from skilled therapeutic intervention in order to improve the following deficits and impairments:  Increased edema, Increased fascial restricitons, Pain (pelvis)  Visit Diagnosis: Other muscle spasm     Problem List Patient Active  Problem List   Diagnosis Date Noted  . 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 Zoster 04/04/2015  . Nausea with vomiting 11/18/2014  . Constipation 11/18/2014  . Left-sided thoracic back pain   . Bone  metastases ([place]) 11/16/2014  . Back pain 11/15/2014  . Uncontrolled pain 11/14/2014  . Post-lymphadenectomy lymphedema of arm 05/31/2014  . Chest wall pain 03/21/2014  . Abnormal LFTs (liver function tests) 09/12/2013  . Breast cancer of upper-inner quadrant of right female breast ([place]) 08/18/2013  . 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, [place], [place], [address] Phone: [phone]   Fax:  [phone]  Name: AMORIA ESCHETE MRN: [id number] Date of Birth: 15-Mar-1961

## 2016-01-23 NOTE — Therapy (Signed)
[name], [place], [address] Phone: [phone]   Fax:  [phone]  Physical Therapy Treatment  Patient Details  Name: [name] MRN: [id number] Date of Birth: March 11, 1961 Referring Provider: Dr. Kyung Rudd  Encounter Date: 01/23/2016      PT End of Session - 01/23/16 1725    Visit Number [id number] for lymph   Number of Visits 95  for lymph   Date for PT Re-Evaluation 03/03/16  for lymph, 03/11/16 for pelvis   Authorization - Visit Number 140   PT Start Time 1520   PT Stop Time 1602   PT Time Calculation (min) 42 min   Activity Tolerance Patient tolerated treatment well   Behavior During Therapy [facility] for tasks assessed/performed      Past Medical History:  Diagnosis Date  . Bone metastases ([place]) dx'd 05/2014  . Breast cancer ([place]) dx'd 2005/2011  . Peripheral vascular disease ([place]) 02/2010   blood clot related to porta cath  . PONV (postoperative nausea and vomiting)   . S/P radiation therapy 07/17/2014 through 08/02/2014    Left mediastinum, left seventh rib 3250 cGy in 13 sessions   . S/P radiation therapy 12/11/2014 through 12/22/2014    Left parietal calvarium 2400 cGy in 8 sessions   . Seizures ([place]) 2010   Isolated incident.    Past Surgical History:  Procedure Laterality Date  . AXILLARY LYMPH NODE DISSECTION  Dec. 2011  . BREAST LUMPECTOMY  2005  . MEDIASTINOTOMY CHAMBERLAIN MCNEIL Left 06/02/2013   Procedure: MEDIASTINOTOMY CHAMBERLAIN MCNEIL;  Surgeon: [name], MD;  Location: [place];  Service: Thoracic;  Laterality: Left;  LEFT ANTERIOR MEDIASTINOTOMY   . PORTACATH PLACEMENT  12/11  . removal portacath      There were no vitals filed for this visit.      Subjective Assessment - 01/23/16 1522    Subjective "I hit my wall for the day."  Has been preparing to go out of town.   Currently in Pain? Yes   Pain Score 6    Pain Location Axilla   Pain Orientation Right   Pain Descriptors / Indicators Other (Comment)  full   Pain Location Groin   Pain Orientation Right   Pain Descriptors / Indicators Tightness                      Pelvic Floor Special Questions - 01/23/16 0001    Pelvic Floor Internal Exam Patient approves physical therapist to perform pelvic floor muscle assessment   Exam Type Vaginal           [facility] Adult PT Treatment/Exercise - 01/23/16 1724      Manual Therapy   Myofascial Release right axilla with focus on scar tightness   Manual Lymphatic Drainage (MLD) In left sidelying, posterior interaxillary anastomosis and right axillo-inguinal anastomosis; in supine, short neck, left axilla and anterior interaxillary anastomosis, right groin and axillo-inguinal anastomosis, and right upper extremity from fingers to shoulder.  In right sidelying, left periscapular area toward left groin.   Other Manual Therapy soft tissue work at superior aspect of right medial and upper thigh in prone position for pain relief and muscle relaxation, gentle myofascial release at upper thigh                PT Education - 01/23/16 1055    Education provided No          PT Short Term Goals -  01/21/16 1303      PT SHORT TERM GOAL #1   Title pain with walking decreased >/= 25%   Time 4   Period Weeks   Status On-going           PT Long Term Goals - 01/15/16 1359      PT LONG TERM GOAL #1   Title indpendent with HEP   Time 8   Period Weeks   Status New     PT LONG TERM GOAL #2   Title pain with walking decreased >/= 75%   Time 8   Period Weeks   Status New     PT LONG TERM GOAL #3   Title ability to flex her right hip with right groin pain decreased >/= 50% due to improved tissue mobility   Time 8   Period Weeks   Status New     PT LONG  TERM GOAL #4   Title -------           [facility] Goals - 01/23/16 1729      CC Long Term Goal  #2   Title Pt. will report swelling is adequately managed to enable ADL function at a consistent level.   Status On-going     CC Long Term Goal  #4   Title Pain/discomfort at right axilla area will be controlled at 6/10 or less.   Status On-going     CC Long Term Goal  #5   Title Patient will avoid infection by ongoing management of her lymphedema at right breast/axilla/upper arm areas.   Status On-going            Plan - 01/23/16 1727    Clinical Impression Statement Tired today, but here later in the day than she normally is for lymphedema therapy.  Reporting fullness at posterior axilla/flank   Rehab Potential Good   Clinical Impairments Affecting Rehab Potential active cancer   PT Frequency 2x / week   PT Duration 12 weeks   PT Treatment/Interventions Manual techniques;Manual lymph drainage   PT Next Visit Plan For lymphedema, continue manual lymph drainage, myofascial release, and soft tissue work as before.   Consulted and Agree with Plan of Care Patient   PT Plan See below for next visit plan.      Patient will benefit from skilled therapeutic intervention in order to improve the following deficits and impairments:  Increased edema, Increased fascial restricitons, Pain  Visit Diagnosis: Lymphedema, not elsewhere classified  Pain in right thigh     Problem List Patient Active Problem List   Diagnosis Date Noted  . 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 Zoster 04/04/2015  . Nausea with vomiting 11/18/2014  . Constipation 11/18/2014  . Left-sided thoracic back pain   . Bone metastases ([place]) 11/16/2014  . Back pain 11/15/2014  . Uncontrolled pain 11/14/2014  . Post-lymphadenectomy lymphedema of arm 05/31/2014  . Chest wall pain 03/21/2014  . Abnormal LFTs (liver function tests) 09/12/2013  . Breast cancer of upper-inner quadrant of right  female breast ([place]) 08/18/2013  . 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

## 2016-01-25 ENCOUNTER — Encounter: Payer: Self-pay | Admitting: Physical Therapy

## 2016-01-29 ENCOUNTER — Telehealth: Payer: Self-pay

## 2016-01-29 NOTE — Telephone Encounter (Signed)
Pt requested IV access appt on November 16 at 1 pm.  Scheduling msg sent.

## 2016-01-30 ENCOUNTER — Other Ambulatory Visit (HOSPITAL_BASED_OUTPATIENT_CLINIC_OR_DEPARTMENT_OTHER): Payer: 59

## 2016-01-30 ENCOUNTER — Ambulatory Visit: Payer: 59 | Attending: Oncology

## 2016-01-30 DIAGNOSIS — I890 Lymphedema, not elsewhere classified: Secondary | ICD-10-CM | POA: Diagnosis present

## 2016-01-30 DIAGNOSIS — C7951 Secondary malignant neoplasm of bone: Secondary | ICD-10-CM | POA: Diagnosis not present

## 2016-01-30 DIAGNOSIS — C50211 Malignant neoplasm of upper-inner quadrant of right female breast: Secondary | ICD-10-CM | POA: Diagnosis not present

## 2016-01-30 DIAGNOSIS — C771 Secondary and unspecified malignant neoplasm of intrathoracic lymph nodes: Secondary | ICD-10-CM

## 2016-01-30 DIAGNOSIS — M62838 Other muscle spasm: Secondary | ICD-10-CM | POA: Insufficient documentation

## 2016-01-30 DIAGNOSIS — J90 Pleural effusion, not elsewhere classified: Secondary | ICD-10-CM

## 2016-01-30 DIAGNOSIS — M79651 Pain in right thigh: Secondary | ICD-10-CM | POA: Diagnosis present

## 2016-01-30 DIAGNOSIS — Z170 Estrogen receptor positive status [ER+]: Secondary | ICD-10-CM

## 2016-01-30 LAB — COMPREHENSIVE METABOLIC PANEL
ALBUMIN: 3.9 g/dL (ref 3.5–5.0)
ALK PHOS: 73 U/L (ref 40–150)
ALT: 32 U/L (ref 0–55)
AST: 28 U/L (ref 5–34)
Anion Gap: 9 mEq/L (ref 3–11)
BILIRUBIN TOTAL: 0.28 mg/dL (ref 0.20–1.20)
BUN: 9.9 mg/dL (ref 7.0–26.0)
CALCIUM: 9.6 mg/dL (ref 8.4–10.4)
CO2: 26 mEq/L (ref 22–29)
CREATININE: 0.8 mg/dL (ref 0.6–1.1)
Chloride: 102 mEq/L (ref 98–109)
EGFR: 84 mL/min/{1.73_m2} — ABNORMAL LOW (ref >90)
GLUCOSE: 84 mg/dL (ref 70–140)
Potassium: 4.1 mEq/L (ref 3.5–5.1)
SODIUM: 137 meq/L (ref 136–145)
TOTAL PROTEIN: 7.4 g/dL (ref 6.4–8.3)

## 2016-01-30 LAB — CBC WITH DIFFERENTIAL/PLATELET
BASO%: 2.6 % — ABNORMAL HIGH (ref 0.0–2.0)
BASOS ABS: 0.1 10*3/uL (ref 0.0–0.1)
EOS ABS: 0.1 10*3/uL (ref 0.0–0.5)
EOS%: 2.9 % (ref 0.0–7.0)
HEMATOCRIT: 35.3 % (ref 34.8–46.6)
HEMOGLOBIN: 12.1 g/dL (ref 11.6–15.9)
LYMPH#: 0.3 10*3/uL — AB (ref 0.9–3.3)
LYMPH%: 12.7 % — ABNORMAL LOW (ref 14.0–49.7)
MCH: 37.5 pg — ABNORMAL HIGH (ref 25.1–34.0)
MCHC: 34.4 g/dL (ref 31.5–36.0)
MCV: 109.2 fL — ABNORMAL HIGH (ref 79.5–101.0)
MONO#: 0.4 10*3/uL (ref 0.1–0.9)
MONO%: 16.2 % — AB (ref 0.0–14.0)
NEUT%: 65.6 % (ref 38.4–76.8)
NEUTROS ABS: 1.8 10*3/uL (ref 1.5–6.5)
Platelets: 298 10*3/uL (ref 145–400)
RBC: 3.23 10*6/uL — ABNORMAL LOW (ref 3.70–5.45)
RDW: 14.1 % (ref 11.2–14.5)
WBC: 2.7 10*3/uL — AB (ref 3.9–10.3)

## 2016-01-30 NOTE — Therapy (Signed)
[place], [place], [address] Phone: [phone]   Fax:  [phone]  Physical Therapy Treatment  Patient Details  Name: [name] MRN: [id number] Date of Birth: 11-May-1960 Referring Provider: Dr. Kyung Rudd  Encounter Date: 01/30/2016      PT End of Session - 01/30/16 1205    Visit Number [id number] for lymph   Number of Visits 95  for lymph    Date for PT Re-Evaluation 03/03/16  for lymph, 03/11/16 for pelvis   PT Start Time 1019   PT Stop Time 1103   PT Time Calculation (min) 44 min   Activity Tolerance Patient tolerated treatment well   Behavior During Therapy [facility] for tasks assessed/performed      Past Medical History:  Diagnosis Date  . Bone metastases ([place]) dx'd 05/2014  . Breast cancer ([place]) dx'd 2005/2011  . Peripheral vascular disease ([place]) 02/2010   blood clot related to porta cath  . PONV (postoperative nausea and vomiting)   . S/P radiation therapy 07/17/2014 through 08/02/2014    Left mediastinum, left seventh rib 3250 cGy in 13 sessions   . S/P radiation therapy 12/11/2014 through 12/22/2014    Left parietal calvarium 2400 cGy in 8 sessions   . Seizures ([place]) 2010   Isolated incident.    Past Surgical History:  Procedure Laterality Date  . AXILLARY LYMPH NODE DISSECTION  Dec. 2011  . BREAST LUMPECTOMY  2005  . MEDIASTINOTOMY CHAMBERLAIN MCNEIL Left 06/02/2013   Procedure: MEDIASTINOTOMY CHAMBERLAIN MCNEIL;  Surgeon: [name], MD;  Location: [facility];  Service: Thoracic;  Laterality: Left;  LEFT ANTERIOR MEDIASTINOTOMY   . PORTACATH PLACEMENT  12/11  . removal portacath      There were no vitals filed for this visit.      Subjective Assessment - 01/30/16 1026    Subjective I did pretty good with walking  in the airport [place] and that was over 2 miles at least of walking. I can tell my posterior hip pain has vastly improved since we started treating it. I had to take off my glove on the plane due to tingling and my fingertips were darkening in color.    Currently in Pain? Yes   Pain Score 6    Pain Location Axilla   Pain Orientation Right   Pain Descriptors / Indicators Other (Comment)  full   Pain Type Chronic pain   Pain Onset More than a month ago   Pain Frequency Intermittent   Aggravating Factors  flying didn't help and after awhile the glove didn't help either   Pain Relieving Factors manual therapy   Pain Score 2   Pain Location Hip   Pain Orientation Right;Posterior   Pain Descriptors / Indicators Tightness   Pain Type Acute pain   Pain Onset 1 to 4 weeks ago   Pain Frequency Intermittent   Aggravating Factors  walking, but that has improved   Pain Relieving Factors manual therapy                         [facility] Adult PT Treatment/Exercise - 01/30/16 0001      Manual Therapy   Myofascial Release right axilla with focus on scar tightness   Manual Lymphatic Drainage (MLD) In left sidelying, posterior interaxillary anastomosis and right axillo-inguinal anastomosis; in supine, short neck, left axilla and anterior interaxillary anastomosis, right groin and axillo-inguinal anastomosis, and right upper extremity from  fingers to shoulder.  In right sidelying, left periscapular area toward left groin.   Other Manual Therapy soft tissue work at superior aspect of right medial and upper thigh in prone position for pain relief and muscle relaxation, gentle myofascial release at upper thigh                  PT Short Term Goals - 01/21/16 1303      PT SHORT TERM GOAL #1   Title pain with walking decreased >/= 25%   Time 4   Period Weeks   Status On-going            [facility] Goals - 01/30/16 1211      CC Long Term Goal  #2   Title Pt. will  report swelling is adequately managed to enable ADL function at a consistent level.   Status On-going     CC Long Term Goal  #4   Title Pain/discomfort at right axilla area will be controlled at 6/10 or less.   Status Achieved     CC Long Term Goal  #5   Title Patient will avoid infection by ongoing management of her lymphedema at right breast/axilla/upper arm areas.   Status On-going     CC Long Term Goal  #6   Title Pain in right groin/ischial tuberosity area will be controlled at 3/10 level or less to enable walking with less pain.   Status Achieved            Plan - 01/30/16 1207    Clinical Impression Statement Pt tolerated session very well today with release felt by pt and therapist in Rt posterior hip muscle tightness during manual therapy. She reports this has been improving steadily and was even able to walk around airport probably greater than 2 miles over weekend wihtout increase pain, just some expected tightness. She was pleased with how she felt after session today.     Rehab Potential Good   Clinical Impairments Affecting Rehab Potential active cancer   PT Frequency 2x / week   PT Duration 12 weeks   PT Treatment/Interventions Manual techniques;Manual lymph drainage   PT Next Visit Plan For lymphedema, continue manual lymph drainage, myofascial release, and soft tissue work as before.   Consulted and Agree with Plan of Care Patient   PT Plan See below for next visit plan.      Patient will benefit from skilled therapeutic intervention in order to improve the following deficits and impairments:  Increased edema, Increased fascial restricitons, Pain  Visit Diagnosis: Other muscle spasm  Lymphedema, not elsewhere classified  Pain in right thigh     Problem List Patient Active Problem List   Diagnosis Date Noted  . 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 Zoster 04/04/2015  . Nausea with vomiting 11/18/2014  . Constipation 11/18/2014  .  Left-sided thoracic back pain   . Bone metastases ([place]) 11/16/2014  . Back pain 11/15/2014  . Uncontrolled pain 11/14/2014  . Post-lymphadenectomy lymphedema of arm 05/31/2014  . Chest wall pain 03/21/2014  . Abnormal LFTs (liver function tests) 09/12/2013  . Breast cancer of upper-inner quadrant of right female breast ([place]) 08/18/2013  . 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

## 2016-01-31 LAB — CANCER ANTIGEN 27.29: CA 27.29: 2042.4 U/mL — ABNORMAL HIGH (ref 0.0–38.6)

## 2016-02-01 ENCOUNTER — Other Ambulatory Visit: Payer: Self-pay | Admitting: Oncology

## 2016-02-01 ENCOUNTER — Ambulatory Visit: Payer: 59 | Admitting: Physical Therapy

## 2016-02-04 ENCOUNTER — Other Ambulatory Visit (HOSPITAL_BASED_OUTPATIENT_CLINIC_OR_DEPARTMENT_OTHER): Payer: 59

## 2016-02-04 ENCOUNTER — Ambulatory Visit: Payer: 59 | Admitting: Physical Therapy

## 2016-02-04 DIAGNOSIS — M62838 Other muscle spasm: Secondary | ICD-10-CM | POA: Diagnosis not present

## 2016-02-04 DIAGNOSIS — C771 Secondary and unspecified malignant neoplasm of intrathoracic lymph nodes: Secondary | ICD-10-CM | POA: Diagnosis not present

## 2016-02-04 DIAGNOSIS — C50211 Malignant neoplasm of upper-inner quadrant of right female breast: Secondary | ICD-10-CM

## 2016-02-04 DIAGNOSIS — J90 Pleural effusion, not elsewhere classified: Secondary | ICD-10-CM

## 2016-02-04 DIAGNOSIS — M79651 Pain in right thigh: Secondary | ICD-10-CM

## 2016-02-04 DIAGNOSIS — I890 Lymphedema, not elsewhere classified: Secondary | ICD-10-CM

## 2016-02-04 LAB — CBC WITH DIFFERENTIAL/PLATELET
BASO%: 2.4 % — ABNORMAL HIGH (ref 0.0–2.0)
BASOS ABS: 0.1 10*3/uL (ref 0.0–0.1)
EOS ABS: 0.1 10*3/uL (ref 0.0–0.5)
EOS%: 1.5 % (ref 0.0–7.0)
HCT: 34.3 % — ABNORMAL LOW (ref 34.8–46.6)
HGB: 12.3 g/dL (ref 11.6–15.9)
LYMPH%: 15.8 % (ref 14.0–49.7)
MCH: 37.4 pg — ABNORMAL HIGH (ref 25.1–34.0)
MCHC: 35.9 g/dL (ref 31.5–36.0)
MCV: 104.3 fL — AB (ref 79.5–101.0)
MONO#: 0.3 10*3/uL (ref 0.1–0.9)
MONO%: 8.5 % (ref 0.0–14.0)
NEUT#: 2.4 10*3/uL (ref 1.5–6.5)
NEUT%: 71.8 % (ref 38.4–76.8)
PLATELETS: 304 10*3/uL (ref 145–400)
RBC: 3.29 10*6/uL — AB (ref 3.70–5.45)
RDW: 13.2 % (ref 11.2–14.5)
WBC: 3.3 10*3/uL — AB (ref 3.9–10.3)
lymph#: 0.5 10*3/uL — ABNORMAL LOW (ref 0.9–3.3)
nRBC: 0 % (ref 0–0)

## 2016-02-04 NOTE — Therapy (Signed)
[place], [place], [address] Phone: [phone]   Fax:  [phone]  Physical Therapy Treatment  Patient Details  Name: [name] [name] MRN: [id number] Date of Birth: [birth year] Referring Provider: Dr. Kyung Rudd  Encounter Date: 02/04/2016      PT End of Session - 02/04/16 1323    Visit Number [id number] for lymph   Number of Visits 95  for lymph   Date for PT Re-Evaluation 03/03/16  03/11/16 for pelvis   Authorization - Visit Number 140   PT Start Time [id number]   PT Stop Time 1104   PT Time Calculation (min) 43 min   Activity Tolerance Patient tolerated treatment well   Behavior During Therapy [facility] for tasks assessed/performed      Past Medical History:  Diagnosis Date  . Bone metastases ([place]) dx'd 05/2014  . Breast cancer ([place]) dx'd 2005/2011  . Peripheral vascular disease ([place]) 02/2010   blood clot related to porta cath  . PONV (postoperative nausea and vomiting)   . S/P radiation therapy 07/17/2014 through 08/02/2014    Left mediastinum, left seventh rib 3250 cGy in 13 sessions   . S/P radiation therapy 12/11/2014 through 12/22/2014    Left parietal calvarium 2400 cGy in 8 sessions   . Seizures ([place]) 2010   Isolated incident.    Past Surgical History:  Procedure Laterality Date  . AXILLARY LYMPH NODE DISSECTION  Dec. 2011  . BREAST LUMPECTOMY  2005  . MEDIASTINOTOMY CHAMBERLAIN MCNEIL Left 06/02/2013   Procedure: MEDIASTINOTOMY CHAMBERLAIN MCNEIL;  Surgeon: [name], MD;  Location: [place];  Service: Thoracic;  Laterality: Left;  LEFT ANTERIOR MEDIASTINOTOMY   . PORTACATH PLACEMENT  12/11  . removal portacath      There were no vitals filed for this visit.      Subjective Assessment - 02/04/16 1023    Subjective Got her  ring on the right hand today.  Is having intermittent pain in right wrist.  Might see if she can cut down to once a week therapy.   Currently in Pain? Yes   Pain Score 6    Pain Location Axilla   Pain Orientation Right   Pain Descriptors / Indicators --  full   Pain Type Chronic pain   Pain Score 1   Pain Location Groin   Pain Orientation Right   Pain Descriptors / Indicators Other (Comment)  "It's very present," swollen   Pain Onset More than a month ago                         [facility] Adult PT Treatment/Exercise - 02/04/16 0001      Manual Therapy   Myofascial Release right axilla with focus on scar tightness   Manual Lymphatic Drainage (MLD) In left sidelying, posterior interaxillary anastomosis and right axillo-inguinal anastomosis; in supine, short neck, left axilla and anterior interaxillary anastomosis, right groin and axillo-inguinal anastomosis, and right upper extremity from fingers to shoulder.  In right sidelying, left periscapular area toward left groin.   Other Manual Therapy soft tissue work at superior aspect of right medial and upper thigh in prone position for pain relief and muscle relaxation, gentle myofascial release at upper thigh                  PT Short Term Goals - 01/21/16 1303      PT SHORT TERM GOAL #1   Title pain  with walking decreased >/= 25%   Time 4   Period Weeks   Status On-going           PT Long Term Goals - 01/15/16 1359      PT LONG TERM GOAL #1   Title indpendent with HEP   Time 8   Period Weeks   Status New     PT LONG TERM GOAL #2   Title pain with walking decreased >/= 75%   Time 8   Period Weeks   Status New     PT LONG TERM GOAL #3   Title ability to flex her right hip with right groin pain decreased >/= 50% due to improved tissue mobility   Time 8   Period Weeks   Status New     PT LONG TERM GOAL #4   Title -------           Long Term Clinic Goals - 01/30/16 1211      CC Long  Term Goal  #2   Title Pt. will report swelling is adequately managed to enable ADL function at a consistent level.   Status On-going     CC Long Term Goal  #4   Title Pain/discomfort at right axilla area will be controlled at 6/10 or less.   Status Achieved     CC Long Term Goal  #5   Title Patient will avoid infection by ongoing management of her lymphedema at right breast/axilla/upper arm areas.   Status On-going     CC Long Term Goal  #6   Title Pain in right groin/ischial tuberosity area will be controlled at 3/10 level or less to enable walking with less pain.   Status Achieved            Plan - 02/04/16 1324    Clinical Impression Statement Continues to benefit from manual lymph drainage particularly for right upper outer breast and right UE, and myofascial release at right thigh.   Rehab Potential Good   Clinical Impairments Affecting Rehab Potential active cancer   PT Frequency 2x / week   PT Duration 12 weeks   PT Treatment/Interventions Manual techniques;Manual lymph drainage   PT Next Visit Plan For lymphedema, continue manual lymph drainage, myofascial release, and soft tissue work as before.   Consulted and Agree with Plan of Care Patient      Patient will benefit from skilled therapeutic intervention in order to improve the following deficits and impairments:  Increased edema, Increased fascial restricitons, Pain  Visit Diagnosis: Lymphedema, not elsewhere classified  Pain in right thigh     Problem List Patient Active Problem List   Diagnosis Date Noted  . 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 Zoster 04/04/2015  . Nausea with vomiting 11/18/2014  . Constipation 11/18/2014  . Left-sided thoracic back pain   . Bone metastases ([place]) 11/16/2014  . Back pain 11/15/2014  . Uncontrolled pain 11/14/2014  . Post-lymphadenectomy lymphedema of arm 05/31/2014  . Chest wall pain 03/21/2014  . Abnormal LFTs (liver function tests) 09/12/2013  .  Breast cancer of upper-inner quadrant of right female breast ([place]) 08/18/2013  . 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

## 2016-02-05 ENCOUNTER — Encounter: Payer: Self-pay | Admitting: Physical Therapy

## 2016-02-05 ENCOUNTER — Ambulatory Visit: Payer: 59 | Attending: Radiation Oncology | Admitting: Physical Therapy

## 2016-02-05 DIAGNOSIS — M62838 Other muscle spasm: Secondary | ICD-10-CM | POA: Diagnosis not present

## 2016-02-05 NOTE — Therapy (Signed)
[facility] [address] [place], [place], [address] Phone: [phone]   Fax:  [phone]  Physical Therapy Treatment  Patient Details  Name: [name] [name] MRN: [id number] Date of Birth: 25-Aug-1960 Referring Provider: Dr. Kyung Rudd  Encounter Date: 02/05/2016      PT End of Session - 02/05/16 1220    Visit Number 122  pelvis   Date for PT Re-Evaluation 03/11/16  pelvis   Authorization - Visit Number 140  pelvis   Authorization - Number of Visits 122  pelvis   PT Start Time [id number]   PT Stop Time 1223  pelvis   PT Time Calculation (min) 38 min   Activity Tolerance Patient tolerated treatment well  pelvis   Behavior During Therapy [facility] for tasks assessed/performed  pelvis      Past Medical History:  Diagnosis Date  . Bone metastases ([place]) dx'd 05/2014  . Breast cancer ([place]) dx'd 2005/2011  . Peripheral vascular disease ([place]) 02/2010   blood clot related to porta cath  . PONV (postoperative nausea and vomiting)   . S/P radiation therapy 07/17/2014 through 08/02/2014    Left mediastinum, left seventh rib 3250 cGy in 13 sessions   . S/P radiation therapy 12/11/2014 through 12/22/2014    Left parietal calvarium 2400 cGy in 8 sessions   . Seizures ([place]) 2010   Isolated incident.    Past Surgical History:  Procedure Laterality Date  . AXILLARY LYMPH NODE DISSECTION  Dec. 2011  . BREAST LUMPECTOMY  2005  . MEDIASTINOTOMY CHAMBERLAIN MCNEIL Left 06/02/2013   Procedure: MEDIASTINOTOMY CHAMBERLAIN MCNEIL;  Surgeon: [name], MD;  Location: [place];  Service: Thoracic;  Laterality: Left;  LEFT ANTERIOR MEDIASTINOTOMY   . PORTACATH PLACEMENT  12/11  . removal portacath      There were no vitals filed for this visit.      Subjective Assessment  - 02/05/16 1146    Subjective NO pain on left side. No pain on anus. Prior to trip.  I did stairs on my trip. I was able to do the stairs without pain. Today I feel the anus on right side feel the presence of heavyness and tightness. d  pelvis   How long can you walk comfortably? more she walks patient will have increased pain that stops her from walking further  pelvis   Diagnostic tests x rays of back show no fractures;   pelvis   Patient Stated Goals walk 20 minutes with minimal limp  pelvis   Currently in Pain? Yes  pelvis   Pain Score 4   pelvis   Pain Location Vagina  pelvis   Pain Orientation Posterior   Pain Descriptors / Indicators Heaviness;Tightness   Pain Type Chronic pain   Pain Onset More than a month ago   Pain Frequency Intermittent   Aggravating Factors  walking, standing in one location   Pain Relieving Factors manual   Multiple Pain Sites No                      Pelvic Floor Special Questions - 02/05/16 0001    Pelvic Floor Internal Exam Patient approves physical therapist to perform pelvic floor muscle assessment   Exam Type Vaginal           [facility] Adult PT Treatment/Exercise - 02/05/16 0001      Manual Therapy   Manual Therapy Internal Pelvic Floor   Internal Pelvic Floor sidely, soft tissue work  with myofascial release to right obturator internist and levator ani; left levator ani                PT Education - 02/05/16 1214    Education provided No          PT Short Term Goals - 02/05/16 1152      PT SHORT TERM GOAL #1   Title pain with walking decreased >/= 25%   Time 4   Period Weeks   Status Achieved           PT Long Term Goals - 02/05/16 1215      PT LONG TERM GOAL #1   Title indpendent with HEP   Time 8   Period Weeks   Status New     PT LONG TERM GOAL #2   Title pain with walking decreased >/= 75%   Time 8   Period Weeks   Status New     PT LONG TERM GOAL #3   Title ability to flex her  right hip with right groin pain decreased >/= 50% due to improved tissue mobility   Time 8   Period Weeks   Status New           Long Term Clinic Goals - 01/30/16 1211      CC Long Term Goal  #2   Title Pt. will report swelling is adequately managed to enable ADL function at a consistent level.   Status On-going     CC Long Term Goal  #4   Title Pain/discomfort at right axilla area will be controlled at 6/10 or less.   Status Achieved     CC Long Term Goal  #5   Title Patient will avoid infection by ongoing management of her lymphedema at right breast/axilla/upper arm areas.   Status On-going     CC Long Term Goal  #6   Title Pain in right groin/ischial tuberosity area will be controlled at 3/10 level or less to enable walking with less pain.   Status Achieved            Plan - 02/05/16 1215    Clinical Impression Statement Patient does not have the shooting pain anymore anteriorly.  She is not having pain on left side. She is having heaviness and tightness on right which has improved by 50-75%. Reduction in limping by 80%.  Patient has most tightness feeling with walking and feels off balance.  Patient will benefit from skilled therapy to reduce pain and improve function.   pelvis   Rehab Potential Good  pelvis   Clinical Impairments Affecting Rehab Potential active cancer  pelvis'   PT Frequency 2x / week  pelvis   PT Duration 8 weeks  pelvis   PT Treatment/Interventions Manual techniques;Therapeutic exercise;Therapeutic activities  pelvis   PT Next Visit Plan soft tissue work  pelvis   PT Home Exercise Plan progress as needed  pelvis   Consulted and Agree with Plan of Care Patient  pelvis      Patient will benefit from skilled therapeutic intervention in order to improve the following deficits and impairments:  Increased edema, Increased fascial restricitons, Pain (pelvis)  Visit Diagnosis: Other muscle spasm     Problem List Patient Active Problem  List   Diagnosis Date Noted  . 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 Zoster 04/04/2015  . Nausea with vomiting 11/18/2014  . Constipation 11/18/2014  . Left-sided thoracic back pain   . Bone metastases ([place])  11/16/2014  . Back pain 11/15/2014  . Uncontrolled pain 11/14/2014  . Post-lymphadenectomy lymphedema of arm 05/31/2014  . Chest wall pain 03/21/2014  . Abnormal LFTs (liver function tests) 09/12/2013  . Breast cancer of upper-inner quadrant of right female breast ([place]) 08/18/2013  . 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 [place], [place], [address] Phone: [phone]   Fax:  [phone]  Name: [name] [name] MRN: [id number] Date of Birth: [birth year]

## 2016-02-06 ENCOUNTER — Ambulatory Visit: Payer: Self-pay | Admitting: Radiation Oncology

## 2016-02-07 ENCOUNTER — Encounter: Payer: 59 | Admitting: Physical Therapy

## 2016-02-07 ENCOUNTER — Other Ambulatory Visit: Payer: Self-pay | Admitting: *Deleted

## 2016-02-07 DIAGNOSIS — C7800 Secondary malignant neoplasm of unspecified lung: Secondary | ICD-10-CM

## 2016-02-07 DIAGNOSIS — C50211 Malignant neoplasm of upper-inner quadrant of right female breast: Secondary | ICD-10-CM

## 2016-02-07 DIAGNOSIS — Z170 Estrogen receptor positive status [ER+]: Principal | ICD-10-CM

## 2016-02-07 DIAGNOSIS — C787 Secondary malignant neoplasm of liver and intrahepatic bile duct: Secondary | ICD-10-CM

## 2016-02-08 ENCOUNTER — Other Ambulatory Visit: Payer: Self-pay | Admitting: *Deleted

## 2016-02-08 ENCOUNTER — Ambulatory Visit: Payer: 59 | Admitting: Physical Therapy

## 2016-02-08 DIAGNOSIS — I890 Lymphedema, not elsewhere classified: Secondary | ICD-10-CM

## 2016-02-08 DIAGNOSIS — M62838 Other muscle spasm: Secondary | ICD-10-CM | POA: Diagnosis not present

## 2016-02-08 DIAGNOSIS — M79651 Pain in right thigh: Secondary | ICD-10-CM

## 2016-02-08 NOTE — Therapy (Signed)
[place], [place], [address] Phone: [phone]   Fax:  [phone]  Physical Therapy Treatment  Patient Details  Name: [name] [name] MRN: [id number] Date of Birth: December 14, 1960 Referring Provider: Dr. Kyung Rudd  Encounter Date: 02/08/2016      PT End of Session - 02/08/16 1204    Visit Number [id number] for lymph   Number of Visits 95  for lymph   Date for PT Re-Evaluation 03/03/16  for lymph   Authorization - Visit Number 140   PT Start Time 1022   PT Stop Time 1103   PT Time Calculation (min) 41 min   Activity Tolerance Patient tolerated treatment well   Behavior During Therapy [facility] for tasks assessed/performed      Past Medical History:  Diagnosis Date  . Bone metastases ([place]) dx'd 05/2014  . Breast cancer ([place]) dx'd 2005/2011  . Peripheral vascular disease ([place]) 02/2010   blood clot related to porta cath  . PONV (postoperative nausea and vomiting)   . S/P radiation therapy 07/17/2014 through 08/02/2014    Left mediastinum, left seventh rib 3250 cGy in 13 sessions   . S/P radiation therapy 12/11/2014 through 12/22/2014    Left parietal calvarium 2400 cGy in 8 sessions   . Seizures ([place]) 2010   Isolated incident.    Past Surgical History:  Procedure Laterality Date  . AXILLARY LYMPH NODE DISSECTION  Dec. 2011  . BREAST LUMPECTOMY  2005  . MEDIASTINOTOMY CHAMBERLAIN MCNEIL Left 06/02/2013   Procedure: MEDIASTINOTOMY CHAMBERLAIN MCNEIL;  Surgeon: [name], MD;  Location: [place];  Service: Thoracic;  Laterality: Left;  LEFT ANTERIOR MEDIASTINOTOMY   . PORTACATH PLACEMENT  12/11  . removal portacath      There were no vitals filed for this visit.      Subjective Assessment - 02/08/16 1024    Subjective We might go to  [employer] and walk around.  [name] (the pelvic therapist) went in on Tuesday and she was really pleased.   Currently in Pain? Yes   Pain Score 6    Pain Location Axilla   Pain Orientation Right   Pain Descriptors / Indicators --  full   Pain Score 1   Pain Location Groin   Pain Orientation Right                         [facility] Adult PT Treatment/Exercise - 02/08/16 0001      Manual Therapy   Myofascial Release right axilla with focus on scar tightness   Manual Lymphatic Drainage (MLD) In left sidelying, posterior interaxillary anastomosis and right axillo-inguinal anastomosis; in supine, short neck, left axilla and anterior interaxillary anastomosis, right groin and axillo-inguinal anastomosis, and right upper extremity from fingers to shoulder.  In right sidelying, left periscapular area toward left groin.   Other Manual Therapy soft tissue work at superior aspect of right medial and upper thigh in prone position for pain relief and muscle relaxation, gentle myofascial release at upper thigh                  PT Short Term Goals - 02/05/16 1152      PT SHORT TERM GOAL #1   Title pain with walking decreased >/= 25%   Time 4   Period Weeks   Status Achieved           PT Long Term Goals - 02/05/16 1215  PT LONG TERM GOAL #1   Title indpendent with HEP   Time 8   Period Weeks   Status New     PT LONG TERM GOAL #2   Title pain with walking decreased >/= 75%   Time 8   Period Weeks   Status New     PT LONG TERM GOAL #3   Title ability to flex her right hip with right groin pain decreased >/= 50% due to improved tissue mobility   Time 8   Period Weeks   Status New           Long Term Clinic Goals - 02/08/16 1207      CC Long Term Goal  #2   Title Pt. will report swelling is adequately managed to enable ADL function at a consistent level.   Status On-going     CC Long Term Goal  #4   Title Pain/discomfort at right axilla  area will be controlled at 6/10 or less.   Status Achieved     CC Long Term Goal  #5   Title Patient will avoid infection by ongoing management of her lymphedema at right breast/axilla/upper arm areas.   Status On-going     CC Long Term Goal  #6   Title Pain in right groin/ischial tuberosity area will be controlled at 3/10 level or less to enable walking with less pain.   Status Achieved            Plan - 02/08/16 1206    Clinical Impression Statement Right upper outer breast area has increased induration today.  Right thigh pain is decreased.  She is maintaining pretty well with the lymphedema treatment at 2x/week.   Rehab Potential Good   Clinical Impairments Affecting Rehab Potential active cancer   PT Frequency 2x / week   PT Duration 12 weeks   PT Treatment/Interventions Manual techniques;Therapeutic exercise;Therapeutic activities   PT Next Visit Plan manual lymph drainage, soft tissue work, myofascial release (for lymphedema therapy)   Consulted and Agree with Plan of Care Patient      Patient will benefit from skilled therapeutic intervention in order to improve the following deficits and impairments:  Increased edema, Increased fascial restricitons, Pain  Visit Diagnosis: Lymphedema, not elsewhere classified  Pain in right thigh     Problem List Patient Active Problem List   Diagnosis Date Noted  . 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 Zoster 04/04/2015  . Nausea with vomiting 11/18/2014  . Constipation 11/18/2014  . Left-sided thoracic back pain   . Bone metastases ([place]) 11/16/2014  . Back pain 11/15/2014  . Uncontrolled pain 11/14/2014  . Post-lymphadenectomy lymphedema of arm 05/31/2014  . Chest wall pain 03/21/2014  . Abnormal LFTs (liver function tests) 09/12/2013  . Breast cancer of upper-inner quadrant of right female breast ([place]) 08/18/2013  . 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

## 2016-02-11 ENCOUNTER — Other Ambulatory Visit (HOSPITAL_BASED_OUTPATIENT_CLINIC_OR_DEPARTMENT_OTHER): Payer: 59

## 2016-02-11 ENCOUNTER — Ambulatory Visit: Payer: 59 | Admitting: Physical Therapy

## 2016-02-11 DIAGNOSIS — C7951 Secondary malignant neoplasm of bone: Secondary | ICD-10-CM

## 2016-02-11 DIAGNOSIS — M62838 Other muscle spasm: Secondary | ICD-10-CM | POA: Diagnosis not present

## 2016-02-11 DIAGNOSIS — C771 Secondary and unspecified malignant neoplasm of intrathoracic lymph nodes: Secondary | ICD-10-CM | POA: Diagnosis not present

## 2016-02-11 DIAGNOSIS — C50211 Malignant neoplasm of upper-inner quadrant of right female breast: Secondary | ICD-10-CM | POA: Diagnosis not present

## 2016-02-11 DIAGNOSIS — J90 Pleural effusion, not elsewhere classified: Secondary | ICD-10-CM

## 2016-02-11 DIAGNOSIS — M79651 Pain in right thigh: Secondary | ICD-10-CM

## 2016-02-11 DIAGNOSIS — I890 Lymphedema, not elsewhere classified: Secondary | ICD-10-CM

## 2016-02-11 LAB — COMPREHENSIVE METABOLIC PANEL
ALT: 29 U/L (ref 0–55)
ANION GAP: 10 meq/L (ref 3–11)
AST: 28 U/L (ref 5–34)
Albumin: 3.8 g/dL (ref 3.5–5.0)
Alkaline Phosphatase: 74 U/L (ref 40–150)
BILIRUBIN TOTAL: 0.38 mg/dL (ref 0.20–1.20)
BUN: 10.2 mg/dL (ref 7.0–26.0)
CHLORIDE: 100 meq/L (ref 98–109)
CO2: 26 meq/L (ref 22–29)
CREATININE: 0.8 mg/dL (ref 0.6–1.1)
Calcium: 10.1 mg/dL (ref 8.4–10.4)
EGFR: 79 mL/min/{1.73_m2} — ABNORMAL LOW (ref >90)
GLUCOSE: 85 mg/dL (ref 70–140)
Potassium: 3.9 mEq/L (ref 3.5–5.1)
SODIUM: 137 meq/L (ref 136–145)
TOTAL PROTEIN: 7.5 g/dL (ref 6.4–8.3)

## 2016-02-11 LAB — CBC WITH DIFFERENTIAL/PLATELET
BASO%: 1.3 % (ref 0.0–2.0)
Basophils Absolute: 0 10*3/uL (ref 0.0–0.1)
EOS%: 1.3 % (ref 0.0–7.0)
Eosinophils Absolute: 0 10*3/uL (ref 0.0–0.5)
HCT: 34.7 % — ABNORMAL LOW (ref 34.8–46.6)
HGB: 12.3 g/dL (ref 11.6–15.9)
LYMPH%: 11.9 % — AB (ref 14.0–49.7)
MCH: 37.2 pg — ABNORMAL HIGH (ref 25.1–34.0)
MCHC: 35.4 g/dL (ref 31.5–36.0)
MCV: 104.8 fL — ABNORMAL HIGH (ref 79.5–101.0)
MONO#: 0.2 10*3/uL (ref 0.1–0.9)
MONO%: 7.6 % (ref 0.0–14.0)
NEUT%: 77.9 % — AB (ref 38.4–76.8)
NEUTROS ABS: 2.4 10*3/uL (ref 1.5–6.5)
PLATELETS: 231 10*3/uL (ref 145–400)
RBC: 3.31 10*6/uL — AB (ref 3.70–5.45)
RDW: 12.9 % (ref 11.2–14.5)
WBC: 3 10*3/uL — AB (ref 3.9–10.3)
lymph#: 0.4 10*3/uL — ABNORMAL LOW (ref 0.9–3.3)

## 2016-02-11 NOTE — Therapy (Signed)
[place], [place], [address] Phone: [phone]   Fax:  [phone]  Physical Therapy Treatment  Patient Details  Name: [name] [name] MRN: [id number] Date of Birth: 06-01-1960 Referring Provider: Dr. Kyung Rudd  Encounter Date: 02/11/2016      PT End of Session - 02/11/16 1108    Visit Number [id number] for lymph   Number of Visits 95  for lymph   Date for PT Re-Evaluation 03/03/16  for lymph   Authorization - Visit Number 140   PT Start Time 1022   PT Stop Time 1104   PT Time Calculation (min) 42 min   Activity Tolerance Patient tolerated treatment well   Behavior During Therapy [facility] for tasks assessed/performed      Past Medical History:  Diagnosis Date  . Bone metastases ([place]) dx'd 05/2014  . Breast cancer ([place]) dx'd 2005/2011  . Peripheral vascular disease ([place]) 02/2010   blood clot related to porta cath  . PONV (postoperative nausea and vomiting)   . S/P radiation therapy 07/17/2014 through 08/02/2014    Left mediastinum, left seventh rib 3250 cGy in 13 sessions   . S/P radiation therapy 12/11/2014 through 12/22/2014    Left parietal calvarium 2400 cGy in 8 sessions   . Seizures ([place]) 2010   Isolated incident.    Past Surgical History:  Procedure Laterality Date  . AXILLARY LYMPH NODE DISSECTION  Dec. 2011  . BREAST LUMPECTOMY  2005  . MEDIASTINOTOMY CHAMBERLAIN MCNEIL Left 06/02/2013   Procedure: MEDIASTINOTOMY CHAMBERLAIN MCNEIL;  Surgeon: [name], MD;  Location: [place];  Service: Thoracic;  Laterality: Left;  LEFT ANTERIOR MEDIASTINOTOMY   . PORTACATH PLACEMENT  12/11  . removal portacath      There were no vitals filed for this visit.      Subjective Assessment - 02/11/16 1022    Subjective "Sorry I'm  running late.  My brain isn't working." "I did a lot of activity Friday, Saturday, and Sunday, and I'm not in pain."   Currently in Pain? Yes   Pain Score 6   5 to 6   Pain Location Axilla  hand swelled up yesterday   Pain Orientation Right   Pain Descriptors / Indicators Other (Comment)  full   Pain Score 0   Pain Location Groin   Pain Orientation Right   Pain Descriptors / Indicators Tightness  not pain                         [facility] Adult PT Treatment/Exercise - 02/11/16 0001      Manual Therapy   Myofascial Release right axilla with focus on scar tightness   Manual Lymphatic Drainage (MLD) In left sidelying, posterior interaxillary anastomosis and right axillo-inguinal anastomosis; in supine, short neck, left axilla and anterior interaxillary anastomosis, right groin and axillo-inguinal anastomosis, and right upper extremity from fingers to shoulder.  In right sidelying, left periscapular area toward left groin.   Other Manual Therapy soft tissue work at superior aspect of right medial and upper thigh in prone position for pain relief and muscle relaxation, gentle myofascial release at upper thigh                  PT Short Term Goals - 02/05/16 1152      PT SHORT TERM GOAL #1   Title pain with walking decreased >/= 25%   Time 4   Period Weeks  Status Achieved           PT Long Term Goals - 02/05/16 1215      PT LONG TERM GOAL #1   Title indpendent with HEP   Time 8   Period Weeks   Status New     PT LONG TERM GOAL #2   Title pain with walking decreased >/= 75%   Time 8   Period Weeks   Status New     PT LONG TERM GOAL #3   Title ability to flex her right hip with right groin pain decreased >/= 50% due to improved tissue mobility   Time 8   Period Weeks   Status New           Long Term Clinic Goals - 02/08/16 1207      CC Long Term Goal  #2   Title Pt. will report swelling is adequately managed to enable ADL function at a  consistent level.   Status On-going     CC Long Term Goal  #4   Title Pain/discomfort at right axilla area will be controlled at 6/10 or less.   Status Achieved     CC Long Term Goal  #5   Title Patient will avoid infection by ongoing management of her lymphedema at right breast/axilla/upper arm areas.   Status On-going     CC Long Term Goal  #6   Title Pain in right groin/ischial tuberosity area will be controlled at 3/10 level or less to enable walking with less pain.   Status Achieved            Plan - 02/11/16 1450    Clinical Impression Statement Patient has been doing well with activity and maintaining thigh pain at a lower level.  Swelling continues to fluctuate to some extent in her hand and is ongoing in the right upper outer breast and axilla/flank areas.   Rehab Potential Good   Clinical Impairments Affecting Rehab Potential active cancer   PT Frequency 2x / week   PT Duration 12 weeks   PT Treatment/Interventions Manual techniques;Manual lymph drainage   PT Next Visit Plan manual lymph drainage, soft tissue work, myofascial release (for lymphedema therapy)   Consulted and Agree with Plan of Care Patient      Patient will benefit from skilled therapeutic intervention in order to improve the following deficits and impairments:  Increased edema, Increased fascial restricitons, Pain  Visit Diagnosis: Lymphedema, not elsewhere classified  Pain in right thigh     Problem List Patient Active Problem List   Diagnosis Date Noted  . 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 Zoster 04/04/2015  . Nausea with vomiting 11/18/2014  . Constipation 11/18/2014  . Left-sided thoracic back pain   . Bone metastases ([place]) 11/16/2014  . Back pain 11/15/2014  . Uncontrolled pain 11/14/2014  . Post-lymphadenectomy lymphedema of arm 05/31/2014  . Chest wall pain 03/21/2014  . Abnormal LFTs (liver function tests) 09/12/2013  . Breast cancer of upper-inner quadrant of  right female breast ([place]) 08/18/2013  . 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

## 2016-02-12 ENCOUNTER — Other Ambulatory Visit: Payer: Self-pay | Admitting: Oncology

## 2016-02-12 ENCOUNTER — Ambulatory Visit: Payer: 59 | Admitting: Physical Therapy

## 2016-02-12 ENCOUNTER — Encounter: Payer: Self-pay | Admitting: Physical Therapy

## 2016-02-12 DIAGNOSIS — C50211 Malignant neoplasm of upper-inner quadrant of right female breast: Secondary | ICD-10-CM

## 2016-02-12 DIAGNOSIS — M62838 Other muscle spasm: Secondary | ICD-10-CM | POA: Diagnosis not present

## 2016-02-12 DIAGNOSIS — Z170 Estrogen receptor positive status [ER+]: Secondary | ICD-10-CM

## 2016-02-12 DIAGNOSIS — C7951 Secondary malignant neoplasm of bone: Secondary | ICD-10-CM

## 2016-02-12 DIAGNOSIS — C771 Secondary and unspecified malignant neoplasm of intrathoracic lymph nodes: Secondary | ICD-10-CM

## 2016-02-12 LAB — CANCER ANTIGEN 27.29

## 2016-02-12 NOTE — Therapy (Signed)
[facility] [address] [place], [place], [address] Phone: [phone]   Fax:  [phone]  Physical Therapy Treatment  Patient Details  Name: [name] [name] MRN: [id number] Date of Birth: [birth year] Referring Provider: Dr. Kyung Rudd  Encounter Date: 02/12/2016      PT End of Session - 02/12/16 1153    Visit Number 125  pelvis   Date for PT Re-Evaluation 03/11/16  pelvis   Authorization - Visit Number 140  pelvis   Authorization - Number of Visits 125  pelvis   PT Start Time 1146  pelvis   PT Stop Time 1225  pelvis   PT Time Calculation (min) 39 min   Activity Tolerance Patient tolerated treatment well  pelvis   Behavior During Therapy [facility] for tasks assessed/performed  pelvis      Past Medical History:  Diagnosis Date  . Bone metastases ([place]) dx'd 05/2014  . Breast cancer ([place]) dx'd 2005/2011  . Peripheral vascular disease ([place]) 02/2010   blood clot related to porta cath  . PONV (postoperative nausea and vomiting)   . S/P radiation therapy 07/17/2014 through 08/02/2014    Left mediastinum, left seventh rib 3250 cGy in 13 sessions   . S/P radiation therapy 12/11/2014 through 12/22/2014    Left parietal calvarium 2400 cGy in 8 sessions   . Seizures ([place]) 2010   Isolated incident.    Past Surgical History:  Procedure Laterality Date  . AXILLARY LYMPH NODE DISSECTION  Dec. 2011  . BREAST LUMPECTOMY  2005  . MEDIASTINOTOMY CHAMBERLAIN MCNEIL Left 06/02/2013   Procedure: MEDIASTINOTOMY CHAMBERLAIN MCNEIL;  Surgeon: [name], MD;  Location: [facility];  Service: Thoracic;  Laterality: Left;  LEFT ANTERIOR MEDIASTINOTOMY   . PORTACATH PLACEMENT  12/11  . removal portacath      There were no vitals filed for this visit.      Subjective  Assessment - 02/12/16 1149    Subjective I have been walking more. I feel the tightness I have started Friday morning.  The outside muscle group is tight. I want to cancel next Monday. I feel things are going the right direction. Burning pain is gone.   pelvis   How long can you walk comfortably? more she walks patient will have increased pain that stops her from walking further  pelvis   Diagnostic tests x rays of back show no fractures;   pelvis   Patient Stated Goals walk 20 minutes with minimal limp  pelvis   Currently in Pain? Yes  pelvis   Pain Score 1   pelvis   Pain Location Pelvis  pelvis   Pain Orientation Right  pelvis   Pain Descriptors / Indicators Heaviness;Tightness  pelvis   Pain Type Chronic pain  pelvis   Pain Frequency Intermittent   Aggravating Factors  walking  walking   Pain Relieving Factors --  stretching   Multiple Pain Sites No                      Pelvic Floor Special Questions - 02/12/16 0001    Pelvic Floor Internal Exam Patient approves physical therapist to perform pelvic floor muscle assessment   Exam Type Vaginal           [facility] Adult PT Treatment/Exercise - 02/12/16 0001      Manual Therapy   Manual Therapy Internal Pelvic Floor   Internal Pelvic Floor sidely, soft tissue work with myofascial release to right  obturator internist and levator ani; left levator ani   Other Manual Therapy right hamstring, around right ishcial tuberosity, and hip adductor origin                PT Education - 02/12/16 1221    Education provided No          PT Short Term Goals - 02/12/16 1226      PT SHORT TERM GOAL #1   Title pain with walking decreased >/= 25%   Time 4   Period Weeks   Status Achieved           PT Long Term Goals - 02/12/16 1226      PT LONG TERM GOAL #1   Title indpendent with HEP   Time 8   Period Weeks   Status On-going     PT LONG TERM GOAL #2   Title pain with walking decreased >/= 75%    Time 8   Period Weeks   Status On-going     PT LONG TERM GOAL #3   Title ability to flex her right hip with right groin pain decreased >/= 50% due to improved tissue mobility   Time 8   Period Weeks   Status On-going           Long Term Clinic Goals - 02/08/16 1207      CC Long Term Goal  #2   Title Pt. will report swelling is adequately managed to enable ADL function at a consistent level.   Status On-going     CC Long Term Goal  #4   Title Pain/discomfort at right axilla area will be controlled at 6/10 or less.   Status Achieved     CC Long Term Goal  #5   Title Patient will avoid infection by ongoing management of her lymphedema at right breast/axilla/upper arm areas.   Status On-going     CC Long Term Goal  #6   Title Pain in right groin/ischial tuberosity area will be controlled at 3/10 level or less to enable walking with less pain.   Status Achieved            Plan - 02/12/16 1222    Clinical Impression Statement Patient is ambulating without a cane.  Patient was able to ambulate for 3 miles.  Patient is only having tightness instead of pain.  Patient will be missing a week of therapy to see how olng the relief after therapy lasts. Patient will benefit from skilled therapy to improve tissue mobility.    Rehab Potential Good  pelvis   Clinical Impairments Affecting Rehab Potential active cancer  pelvis   PT Frequency 2x / week  pelvis   PT Duration 8 weeks  pelvis   PT Treatment/Interventions Therapeutic activities;Therapeutic exercise;Manual techniques  pelvis   PT Next Visit Plan soft tissue work, myofascial release for pelvis floor  pelvis   PT Home Exercise Plan progress as needed  pelvis   Consulted and Agree with Plan of Care Patient  pelvis      Patient will benefit from skilled therapeutic intervention in order to improve the following deficits and impairments:  Increased edema, Increased fascial restricitons, Pain (pelvis)  Visit  Diagnosis: Other muscle spasm     Problem List Patient Active Problem List   Diagnosis Date Noted  . 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 Zoster 04/04/2015  . Nausea with vomiting 11/18/2014  . Constipation 11/18/2014  . Left-sided thoracic back pain   .  Bone metastases ([place]) 11/16/2014  . Back pain 11/15/2014  . Uncontrolled pain 11/14/2014  . Post-lymphadenectomy lymphedema of arm 05/31/2014  . Chest wall pain 03/21/2014  . Abnormal LFTs (liver function tests) 09/12/2013  . Breast cancer of upper-inner quadrant of right female breast ([place]) 08/18/2013  . 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 [place], [place], [address] Phone: [phone]   Fax:  [phone]  Name: [name] [name] MRN: [id number] Date of Birth: April 21, 1960

## 2016-02-13 ENCOUNTER — Other Ambulatory Visit: Payer: Self-pay | Admitting: *Deleted

## 2016-02-13 ENCOUNTER — Ambulatory Visit (HOSPITAL_BASED_OUTPATIENT_CLINIC_OR_DEPARTMENT_OTHER): Payer: 59

## 2016-02-13 VITALS — BP 130/78 | HR 100 | Temp 98.0°F | Resp 20

## 2016-02-13 DIAGNOSIS — C7951 Secondary malignant neoplasm of bone: Secondary | ICD-10-CM

## 2016-02-13 DIAGNOSIS — C50211 Malignant neoplasm of upper-inner quadrant of right female breast: Secondary | ICD-10-CM

## 2016-02-13 DIAGNOSIS — J910 Malignant pleural effusion: Secondary | ICD-10-CM

## 2016-02-13 DIAGNOSIS — R7989 Other specified abnormal findings of blood chemistry: Secondary | ICD-10-CM

## 2016-02-13 DIAGNOSIS — M549 Dorsalgia, unspecified: Secondary | ICD-10-CM

## 2016-02-13 DIAGNOSIS — Z5111 Encounter for antineoplastic chemotherapy: Secondary | ICD-10-CM | POA: Diagnosis not present

## 2016-02-13 DIAGNOSIS — R945 Abnormal results of liver function studies: Secondary | ICD-10-CM

## 2016-02-13 DIAGNOSIS — Z170 Estrogen receptor positive status [ER+]: Principal | ICD-10-CM

## 2016-02-13 DIAGNOSIS — C771 Secondary and unspecified malignant neoplasm of intrathoracic lymph nodes: Secondary | ICD-10-CM

## 2016-02-13 DIAGNOSIS — R0789 Other chest pain: Secondary | ICD-10-CM

## 2016-02-13 MED ORDER — DENOSUMAB 120 MG/1.7ML ~~LOC~~ SOLN
120.0000 mg | Freq: Once | SUBCUTANEOUS | Status: AC
  Administered 2016-02-13: 120 mg via SUBCUTANEOUS
  Filled 2016-02-13: qty 1.7

## 2016-02-13 MED ORDER — FULVESTRANT 250 MG/5ML IM SOLN
500.0000 mg | Freq: Once | INTRAMUSCULAR | Status: AC
  Administered 2016-02-13: 500 mg via INTRAMUSCULAR
  Filled 2016-02-13: qty 10

## 2016-02-13 NOTE — Patient Instructions (Signed)
Denosumab injection What is this medicine? DENOSUMAB (den oh sue mab) slows bone breakdown. Prolia is used to treat osteoporosis in women after menopause and in men. Xgeva is used to prevent bone fractures and other bone problems caused by cancer bone metastases. Xgeva is also used to treat giant cell tumor of the bone. This medicine may be used for other purposes; ask your health care provider or pharmacist if you have questions. What should I tell my health care provider before I take this medicine? They need to know if you have any of these conditions: -dental disease -eczema -infection or history of infections -kidney disease or on dialysis -low blood calcium or vitamin D -malabsorption syndrome -scheduled to have surgery or tooth extraction -taking medicine that contains denosumab -thyroid or parathyroid disease -an unusual reaction to denosumab, other medicines, foods, dyes, or preservatives -pregnant or trying to get pregnant -breast-feeding How should I use this medicine? This medicine is for injection under the skin. It is given by a health care professional in a hospital or clinic setting. If you are getting Prolia, a special MedGuide will be given to you by the pharmacist with each prescription and refill. Be sure to read this information carefully each time. For Prolia, talk to your pediatrician regarding the use of this medicine in children. Special care may be needed. For Xgeva, talk to your pediatrician regarding the use of this medicine in children. While this drug may be prescribed for children as young as 13 years for selected conditions, precautions do apply. Overdosage: If you think you have taken too much of this medicine contact a poison control center or emergency room at once. NOTE: This medicine is only for you. Do not share this medicine with others. What if I miss a dose? It is important not to miss your dose. Call your doctor or health care professional if you are  unable to keep an appointment. What may interact with this medicine? Do not take this medicine with any of the following medications: -other medicines containing denosumab This medicine may also interact with the following medications: -medicines that suppress the immune system -medicines that treat cancer -steroid medicines like prednisone or cortisone This list may not describe all possible interactions. Give your health care provider a list of all the medicines, herbs, non-prescription drugs, or dietary supplements you use. Also tell them if you smoke, drink alcohol, or use illegal drugs. Some items may interact with your medicine. What should I watch for while using this medicine? Visit your doctor or health care professional for regular checks on your progress. Your doctor or health care professional may order blood tests and other tests to see how you are doing. Call your doctor or health care professional if you get a cold or other infection while receiving this medicine. Do not treat yourself. This medicine may decrease your body's ability to fight infection. You should make sure you get enough calcium and vitamin D while you are taking this medicine, unless your doctor tells you not to. Discuss the foods you eat and the vitamins you take with your health care professional. See your dentist regularly. Brush and floss your teeth as directed. Before you have any dental work done, tell your dentist you are receiving this medicine. Do not become pregnant while taking this medicine or for 5 months after stopping it. Women should inform their doctor if they wish to become pregnant or think they might be pregnant. There is a potential for serious side effects   to an unborn child. Talk to your health care professional or pharmacist for more information. What side effects may I notice from receiving this medicine? Side effects that you should report to your doctor or health care professional as soon as  possible: -allergic reactions like skin rash, itching or hives, swelling of the face, lips, or tongue -breathing problems -chest pain -fast, irregular heartbeat -feeling faint or lightheaded, falls -fever, chills, or any other sign of infection -muscle spasms, tightening, or twitches -numbness or tingling -skin blisters or bumps, or is dry, peels, or red -slow healing or unexplained pain in the mouth or jaw -unusual bleeding or bruising Side effects that usually do not require medical attention (Report these to your doctor or health care professional if they continue or are bothersome.): -muscle pain -stomach upset, gas This list may not describe all possible side effects. Call your doctor for medical advice about side effects. You may report side effects to FDA at 1-800-FDA-1088. Where should I keep my medicine? This medicine is only given in a clinic, doctor's office, or other health care setting and will not be stored at home. NOTE: This sheet is a summary. It may not cover all possible information. If you have questions about this medicine, talk to your doctor, pharmacist, or health care provider.    2016, Elsevier/Gold Standard. (2011-09-15 12:37:47) Fulvestrant injection What is this medicine? FULVESTRANT (ful VES trant) blocks the effects of estrogen. It is used to treat breast cancer. This medicine may be used for other purposes; ask your health care provider or pharmacist if you have questions. What should I tell my health care provider before I take this medicine? They need to know if you have any of these conditions: -bleeding problems -liver disease -low levels of platelets in the blood -an unusual or allergic reaction to fulvestrant, other medicines, foods, dyes, or preservatives -pregnant or trying to get pregnant -breast-feeding How should I use this medicine? This medicine is for injection into a muscle. It is usually given by a health care professional in a  hospital or clinic setting. Talk to your pediatrician regarding the use of this medicine in children. Special care may be needed. Overdosage: If you think you have taken too much of this medicine contact a poison control center or emergency room at once. NOTE: This medicine is only for you. Do not share this medicine with others. What if I miss a dose? It is important not to miss your dose. Call your doctor or health care professional if you are unable to keep an appointment. What may interact with this medicine? -medicines that treat or prevent blood clots like warfarin, enoxaparin, and dalteparin This list may not describe all possible interactions. Give your health care provider a list of all the medicines, herbs, non-prescription drugs, or dietary supplements you use. Also tell them if you smoke, drink alcohol, or use illegal drugs. Some items may interact with your medicine. What should I watch for while using this medicine? Your condition will be monitored carefully while you are receiving this medicine. You will need important blood work done while you are taking this medicine. Do not become pregnant while taking this medicine or for at least 1 year after stopping it. Women of child-bearing potential will need to have a negative pregnancy test before starting this medicine. Women should inform their doctor if they wish to become pregnant or think they might be pregnant. There is a potential for serious side effects to an unborn child. Men   should inform their doctors if they wish to father a child. This medicine may lower sperm counts. Talk to your health care professional or pharmacist for more information. Do not breast-feed an infant while taking this medicine or for 1 year after the last dose. What side effects may I notice from receiving this medicine? Side effects that you should report to your doctor or health care professional as soon as possible: -allergic reactions like skin rash,  itching or hives, swelling of the face, lips, or tongue -feeling faint or lightheaded, falls -pain, tingling, numbness, or weakness in the legs -signs and symptoms of infection like fever or chills; cough; flu-like symptoms; sore throat -vaginal bleeding Side effects that usually do not require medical attention (report to your doctor or health care professional if they continue or are bothersome): -aches, pains -constipation -diarrhea -headache -hot flashes -nausea, vomiting -pain at site where injected -stomach pain This list may not describe all possible side effects. Call your doctor for medical advice about side effects. You may report side effects to FDA at 1-800-FDA-1088. Where should I keep my medicine? This drug is given in a hospital or clinic and will not be stored at home. NOTE: This sheet is a summary. It may not cover all possible information. If you have questions about this medicine, talk to your doctor, pharmacist, or health care provider.    2016, Elsevier/Gold Standard. (2014-10-13 11:03:55)  

## 2016-02-14 ENCOUNTER — Encounter: Payer: 59 | Admitting: Physical Therapy

## 2016-02-14 ENCOUNTER — Other Ambulatory Visit: Payer: Self-pay | Admitting: Oncology

## 2016-02-14 ENCOUNTER — Ambulatory Visit (HOSPITAL_COMMUNITY)
Admission: RE | Admit: 2016-02-14 | Discharge: 2016-02-14 | Disposition: A | Discharge status: Home / Self Care | Payer: 59 | Source: Ambulatory Visit | Attending: Oncology | Admitting: Oncology

## 2016-02-14 ENCOUNTER — Ambulatory Visit: Payer: 59

## 2016-02-14 DIAGNOSIS — C7951 Secondary malignant neoplasm of bone: Secondary | ICD-10-CM | POA: Diagnosis present

## 2016-02-14 DIAGNOSIS — C771 Secondary and unspecified malignant neoplasm of intrathoracic lymph nodes: Secondary | ICD-10-CM

## 2016-02-14 DIAGNOSIS — C50211 Malignant neoplasm of upper-inner quadrant of right female breast: Secondary | ICD-10-CM

## 2016-02-14 DIAGNOSIS — K8020 Calculus of gallbladder without cholecystitis without obstruction: Secondary | ICD-10-CM | POA: Insufficient documentation

## 2016-02-14 DIAGNOSIS — C787 Secondary malignant neoplasm of liver and intrahepatic bile duct: Secondary | ICD-10-CM | POA: Diagnosis not present

## 2016-02-14 MED ORDER — GADOBENATE DIMEGLUMINE 529 MG/ML IV SOLN
5.0000 mL | Freq: Once | INTRAVENOUS | Status: AC | PRN
  Administered 2016-02-14: 5 mL via INTRAVENOUS

## 2016-02-14 NOTE — Progress Notes (Signed)
Pt here for IV start prior to scan; refusing VS and assessment to be done. Pt discharged ambulatory with Saline Lock to left wrist in no acute distress.

## 2016-02-15 ENCOUNTER — Ambulatory Visit: Payer: 59 | Admitting: Physical Therapy

## 2016-02-15 DIAGNOSIS — M79651 Pain in right thigh: Secondary | ICD-10-CM

## 2016-02-15 DIAGNOSIS — M62838 Other muscle spasm: Secondary | ICD-10-CM | POA: Diagnosis not present

## 2016-02-15 DIAGNOSIS — I890 Lymphedema, not elsewhere classified: Secondary | ICD-10-CM

## 2016-02-15 NOTE — Therapy (Signed)
[place], [place], [address] Phone: [phone]   Fax:  [phone]  Physical Therapy Treatment  Patient Details  Name: [name] [name] MRN: [id number] Date of Birth: 08-Jul-1960 Referring Provider: Dr. Kyung Rudd  Encounter Date: 02/15/2016      PT End of Session - 02/15/16 1208    Visit Number [id number] for lymph   Number of Visits 95  for lymph   Date for PT Re-Evaluation 03/03/16  for lymph   Authorization - Visit Number 140   PT Start Time 1019   PT Stop Time 1104   PT Time Calculation (min) 45 min   Activity Tolerance Patient tolerated treatment well   Behavior During Therapy [facility] for tasks assessed/performed      Past Medical History:  Diagnosis Date  . Bone metastases ([place]) dx'd 05/2014  . Breast cancer ([place]) dx'd 2005/2011  . Peripheral vascular disease ([place]) 02/2010   blood clot related to porta cath  . PONV (postoperative nausea and vomiting)   . S/P radiation therapy 07/17/2014 through 08/02/2014    Left mediastinum, left seventh rib 3250 cGy in 13 sessions   . S/P radiation therapy 12/11/2014 through 12/22/2014    Left parietal calvarium 2400 cGy in 8 sessions   . Seizures ([place]) 2010   Isolated incident.    Past Surgical History:  Procedure Laterality Date  . AXILLARY LYMPH NODE DISSECTION  Dec. 2011  . BREAST LUMPECTOMY  2005  . MEDIASTINOTOMY CHAMBERLAIN MCNEIL Left 06/02/2013   Procedure: MEDIASTINOTOMY CHAMBERLAIN MCNEIL;  Surgeon: [name], MD;  Location: [facility];  Service: Thoracic;  Laterality: Left;  LEFT ANTERIOR MEDIASTINOTOMY   . PORTACATH PLACEMENT  12/11  . removal portacath      There were no vitals filed for this visit.      Subjective Assessment - 02/15/16 1019    Subjective The hand is a  little swollen.   Currently in Pain? Yes   Pain Score 6    Pain Location Axilla   Pain Orientation Right   Aggravating Factors  weather   Pain Relieving Factors therapy                         [facility] Adult PT Treatment/Exercise - 02/15/16 0001      Manual Therapy   Myofascial Release right axilla with focus on scar tightness   Manual Lymphatic Drainage (MLD) In left sidelying, posterior interaxillary anastomosis and right axillo-inguinal anastomosis; in supine, short neck, left axilla and anterior interaxillary anastomosis, right groin and axillo-inguinal anastomosis, and right upper extremity from fingers to shoulder.  In right sidelying, left periscapular area toward left groin.   Other Manual Therapy soft tissue work at superior aspect of right medial and upper thigh in prone position for pain relief and muscle relaxation, gentle myofascial release at upper thigh                  PT Short Term Goals - 02/12/16 1226      PT SHORT TERM GOAL #1   Title pain with walking decreased >/= 25%   Time 4   Period Weeks   Status Achieved           PT Long Term Goals - 02/12/16 1226      PT LONG TERM GOAL #1   Title indpendent with HEP   Time 8   Period Weeks   Status On-going  PT LONG TERM GOAL #2   Title pain with walking decreased >/= 75%   Time 8   Period Weeks   Status On-going     PT LONG TERM GOAL #3   Title ability to flex her right hip with right groin pain decreased >/= 50% due to improved tissue mobility   Time 8   Period Weeks   Status On-going           Long Term Clinic Goals - 02/15/16 1212      CC Long Term Goal  #2   Title Pt. will report swelling is adequately managed to enable ADL function at a consistent level.   Status On-going     CC Long Term Goal  #4   Title Pain/discomfort at right axilla area will be controlled at 6/10 or less.   Status On-going     CC Long Term Goal  #5   Title Patient will avoid infection  by ongoing management of her lymphedema at right breast/axilla/upper arm areas.   Status On-going            Plan - 02/15/16 1209    Clinical Impression Statement Ongoing lymphedema that benefits from therapy as she is unable to use her Flexitouch due to other painful areas from metastases and has difficulty reaching some areas for treating herself.  Functioning much better re: groin pain being reduced.   Rehab Potential Good   Clinical Impairments Affecting Rehab Potential active cancer   PT Frequency 2x / week   PT Duration 12 weeks   PT Treatment/Interventions Manual techniques;Manual lymph drainage   PT Next Visit Plan soft tissue work, myofascial release, manual lymph drainage for lymphedema and external painful areas   Consulted and Agree with Plan of Care Patient      Patient will benefit from skilled therapeutic intervention in order to improve the following deficits and impairments:  Increased edema, Increased fascial restricitons, Pain  Visit Diagnosis: Lymphedema, not elsewhere classified  Pain in right thigh     Problem List Patient Active Problem List   Diagnosis Date Noted  . 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 Zoster 04/04/2015  . Nausea with vomiting 11/18/2014  . Constipation 11/18/2014  . Left-sided thoracic back pain   . Bone metastases ([place]) 11/16/2014  . Back pain 11/15/2014  . Uncontrolled pain 11/14/2014  . Post-lymphadenectomy lymphedema of arm 05/31/2014  . Chest wall pain 03/21/2014  . Abnormal LFTs (liver function tests) 09/12/2013  . Breast cancer of upper-inner quadrant of right female breast ([place]) 08/18/2013  . 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

## 2016-02-18 ENCOUNTER — Other Ambulatory Visit (HOSPITAL_BASED_OUTPATIENT_CLINIC_OR_DEPARTMENT_OTHER): Payer: 59

## 2016-02-18 ENCOUNTER — Encounter: Payer: 59 | Admitting: Physical Therapy

## 2016-02-18 ENCOUNTER — Ambulatory Visit: Payer: 59 | Admitting: Physical Therapy

## 2016-02-18 ENCOUNTER — Telehealth: Payer: Self-pay | Admitting: *Deleted

## 2016-02-18 DIAGNOSIS — J90 Pleural effusion, not elsewhere classified: Secondary | ICD-10-CM

## 2016-02-18 DIAGNOSIS — C771 Secondary and unspecified malignant neoplasm of intrathoracic lymph nodes: Secondary | ICD-10-CM | POA: Diagnosis not present

## 2016-02-18 DIAGNOSIS — C50211 Malignant neoplasm of upper-inner quadrant of right female breast: Secondary | ICD-10-CM

## 2016-02-18 DIAGNOSIS — M79651 Pain in right thigh: Secondary | ICD-10-CM

## 2016-02-18 DIAGNOSIS — M62838 Other muscle spasm: Secondary | ICD-10-CM | POA: Diagnosis not present

## 2016-02-18 DIAGNOSIS — I890 Lymphedema, not elsewhere classified: Secondary | ICD-10-CM

## 2016-02-18 LAB — CBC WITH DIFFERENTIAL/PLATELET
BASO%: 1.6 % (ref 0.0–2.0)
Basophils Absolute: 0 10*3/uL (ref 0.0–0.1)
EOS%: 1.6 % (ref 0.0–7.0)
Eosinophils Absolute: 0 10*3/uL (ref 0.0–0.5)
HCT: 32.4 % — ABNORMAL LOW (ref 34.8–46.6)
HGB: 11.5 g/dL — ABNORMAL LOW (ref 11.6–15.9)
LYMPH%: 18.8 % (ref 14.0–49.7)
MCH: 36.7 pg — AB (ref 25.1–34.0)
MCHC: 35.5 g/dL (ref 31.5–36.0)
MCV: 103.5 fL — ABNORMAL HIGH (ref 79.5–101.0)
MONO#: 0.2 10*3/uL (ref 0.1–0.9)
MONO%: 9 % (ref 0.0–14.0)
NEUT%: 69 % (ref 38.4–76.8)
NEUTROS ABS: 1.8 10*3/uL (ref 1.5–6.5)
Platelets: 217 10*3/uL (ref 145–400)
RBC: 3.13 10*6/uL — AB (ref 3.70–5.45)
RDW: 12.9 % (ref 11.2–14.5)
WBC: 2.6 10*3/uL — AB (ref 3.9–10.3)
lymph#: 0.5 10*3/uL — ABNORMAL LOW (ref 0.9–3.3)
nRBC: 0 % (ref 0–0)

## 2016-02-18 NOTE — Telephone Encounter (Signed)
"  I need to come in at 1230 02-22-2016 for an IV start before going for PET scan."  This nurse will enter scheduling request and notify provider to enter order.

## 2016-02-18 NOTE — Therapy (Signed)
[place], [place], [address] Phone: [phone]   Fax:  [phone]  Physical Therapy Treatment  Patient Details  Name: [name] [name] MRN: [id number] Date of Birth: 02-May-1960 Referring Provider: Dr. Kyung Rudd  Encounter Date: 02/18/2016      PT End of Session - 02/18/16 1333    Visit Number [id number] for lymph   Number of Visits 95  for lymph   Date for PT Re-Evaluation 03/03/16   Authorization - Visit Number 140   PT Start Time 1020   PT Stop Time 1101   PT Time Calculation (min) 41 min   Activity Tolerance Patient tolerated treatment well   Behavior During Therapy [facility] for tasks assessed/performed      Past Medical History:  Diagnosis Date  . Bone metastases ([place]) dx'd 05/2014  . Breast cancer ([place]) dx'd 2005/2011  . Peripheral vascular disease ([place]) 02/2010   blood clot related to porta cath  . PONV (postoperative nausea and vomiting)   . S/P radiation therapy 07/17/2014 through 08/02/2014    Left mediastinum, left seventh rib 3250 cGy in 13 sessions   . S/P radiation therapy 12/11/2014 through 12/22/2014    Left parietal calvarium 2400 cGy in 8 sessions   . Seizures ([place]) 2010   Isolated incident.    Past Surgical History:  Procedure Laterality Date  . AXILLARY LYMPH NODE DISSECTION  Dec. 2011  . BREAST LUMPECTOMY  2005  . MEDIASTINOTOMY CHAMBERLAIN MCNEIL Left 06/02/2013   Procedure: MEDIASTINOTOMY CHAMBERLAIN MCNEIL;  Surgeon: [name], MD;  Location: [facility];  Service: Thoracic;  Laterality: Left;  LEFT ANTERIOR MEDIASTINOTOMY   . PORTACATH PLACEMENT  12/11  . removal portacath      There were no vitals filed for this visit.      Subjective Assessment - 02/18/16 1021    Subjective Did a lot with Christmas  decorating this weekend, and hand swelled up quite a bit, but it's a lot better today.  Stood up on a step stool, and got achy in right buttocks area.   Currently in Pain? Yes   Pain Score 6    Pain Location Axilla   Pain Orientation Right   Pain Descriptors / Indicators --  fullness   Aggravating Factors  more swelling   Pain Relieving Factors therapy   Pain Score 2   Pain Location Other (Comment)  bone in buttocks   Pain Orientation Right   Pain Descriptors / Indicators Aching   Aggravating Factors  stepping up on step stool                         [facility] Adult PT Treatment/Exercise - 02/18/16 0001      Manual Therapy   Myofascial Release right axilla with focus on scar tightness   Manual Lymphatic Drainage (MLD) In left sidelying, posterior interaxillary anastomosis and right axillo-inguinal anastomosis; in supine, short neck, left axilla and anterior interaxillary anastomosis, right groin and axillo-inguinal anastomosis, and right upper extremity from fingers to shoulder.  In right sidelying, left periscapular area toward left groin.   Other Manual Therapy soft tissue work at superior aspect of right medial and upper thigh in prone position for pain relief and muscle relaxation, gentle myofascial release at upper thigh                  PT Short Term Goals - 02/12/16 1226  PT SHORT TERM GOAL #1   Title pain with walking decreased >/= 25%   Time 4   Period Weeks   Status Achieved           PT Long Term Goals - 02/12/16 1226      PT LONG TERM GOAL #1   Title indpendent with HEP   Time 8   Period Weeks   Status On-going     PT LONG TERM GOAL #2   Title pain with walking decreased >/= 75%   Time 8   Period Weeks   Status On-going     PT LONG TERM GOAL #3   Title ability to flex her right hip with right groin pain decreased >/= 50% due to improved tissue mobility   Time 8   Period Weeks   Status On-going           Long Term  Clinic Goals - 02/15/16 1212      CC Long Term Goal  #2   Title Pt. will report swelling is adequately managed to enable ADL function at a consistent level.   Status On-going     CC Long Term Goal  #4   Title Pain/discomfort at right axilla area will be controlled at 6/10 or less.   Status On-going     CC Long Term Goal  #5   Title Patient will avoid infection by ongoing management of her lymphedema at right breast/axilla/upper arm areas.   Status On-going            Plan - 02/18/16 1334    Clinical Impression Statement Reports flareup of hand swelling this weekend, and although somewhat better today per her report, there is visible mild swelling in the hand. Benefits from therapy for swelling management and pain relief.  Soft tissue mobilization decreased  pelvic are pain today.   Rehab Potential Good   Clinical Impairments Affecting Rehab Potential active cancer   PT Frequency 2x / week   PT Duration 12 weeks   PT Treatment/Interventions Manual techniques;Manual lymph drainage   PT Next Visit Plan soft tissue work, myofascial release, manual lymph drainage for lymphedema and external painful areas   Consulted and Agree with Plan of Care Patient      Patient will benefit from skilled therapeutic intervention in order to improve the following deficits and impairments:  Increased edema, Increased fascial restricitons, Pain  Visit Diagnosis: Lymphedema, not elsewhere classified  Pain in right thigh     Problem List Patient Active Problem List   Diagnosis Date Noted  . 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 Zoster 04/04/2015  . Nausea with vomiting 11/18/2014  . Constipation 11/18/2014  . Left-sided thoracic back pain   . Bone metastases ([place]) 11/16/2014  . Back pain 11/15/2014  . Uncontrolled pain 11/14/2014  . Post-lymphadenectomy lymphedema of arm 05/31/2014  . Chest wall pain 03/21/2014  . Abnormal LFTs (liver function tests) 09/12/2013  . Breast  cancer of upper-inner quadrant of right female breast ([place]) 08/18/2013  . 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

## 2016-02-19 ENCOUNTER — Ambulatory Visit (HOSPITAL_BASED_OUTPATIENT_CLINIC_OR_DEPARTMENT_OTHER): Payer: 59 | Admitting: Oncology

## 2016-02-19 DIAGNOSIS — Z170 Estrogen receptor positive status [ER+]: Secondary | ICD-10-CM

## 2016-02-19 DIAGNOSIS — C50211 Malignant neoplasm of upper-inner quadrant of right female breast: Secondary | ICD-10-CM

## 2016-02-19 DIAGNOSIS — C787 Secondary malignant neoplasm of liver and intrahepatic bile duct: Secondary | ICD-10-CM | POA: Diagnosis not present

## 2016-02-19 DIAGNOSIS — C771 Secondary and unspecified malignant neoplasm of intrathoracic lymph nodes: Secondary | ICD-10-CM

## 2016-02-19 DIAGNOSIS — C7951 Secondary malignant neoplasm of bone: Secondary | ICD-10-CM

## 2016-02-19 NOTE — Progress Notes (Signed)
[name] [name]  [phone] Fax:([phone]     ID: [name] [name] OB: 10/25/1960  MR#: [id number]  FUX#:[id number]  PCP: Pcp Not In System GYN:  [name] SU:  OTHER MD: [name], [name], [name], [name], [name], Kyung Rudd   CHIEF COMPLAINT: Stage IV breast cancer  CURRENT TREATMENT: Fulvestrant, denosumab, palbociclib  INTERVAL HISTORY:   [name] [name] returns today for follow-up of her metastatic breast cancer accompanied by her husband [name] [name]. She continues on fulvestrant and denosumab/Xgeva every 4 weeks, together with palbociclib at 75 mg daily 21 days on 7 days off. She is tolerating these treatments generally well, except that she feels exhausted from the [place]. She is currently on her week off.  On 02/15/2016 she had restaging MRIs of the thoracic spine in the abdomen. The lesion at T11 appears to be slightly larger. There has been minimal but measurable growth in some of the liver lesions. She is here today to discuss those results.  REVIEW OF SYiSTEMS: [name] [name] is having more pain in her left rib cage area. She describes this as "shooting". This can happen once a week or once a day. She has had a little bit of bright red blood on her stool and admits to constipation. She is using magnesium and 1 Colace occasionally but is reluctant to take medications. She is taking Aleve very rarely for the pelvic pain. Overall a detailed review of systems today was stable as compared to her last visit here.  BREAST CANCER HISTORY: From doctor Bernell List Khan's intake note 03/20/2004:  "The patient is a very pleasant 55 year old female, without significant past medical history.  Her family history is significant for a sister who at age 37 was diagnosed with invasive ductal carcinoma.  She is a breast cancer survivor at age 31 now.  The patient states that she has never really had a screening mammogram until October  2005, when she felt that it was time for her to start having mammograms done on a yearly basis.  Therefore, on 01/26/04, she underwent a screening mammogram and an abnormality was detected in the upper outer right breast.  She, therefore, underwent spot compression views of both the right and the left breast.  The left breast revealed a well-defined mass in the upper outer left quadrant, present at the 2 o'clock position, measuring 1.8 cm, 6 cm from the nipple.  This, by ultrasound, was felt to be a simple cyst measuring 1.8 cm.  On the right breast, a spiculated mass was noted in the upper outer right quadrant.  The ultrasound revealed a shadowing irregular solid mass at the 10:30 position, 9 cm from the nipple, measuring 1.2 cm in greatest dimension, correlating with the spiculated mass seen on the mammogram.  The right axilla was negative ultrasonically.  Because of this, the patient underwent a needle biopsy of the right breast and the biopsy was positive invasive mammary carcinoma that showed features consistent with a high-grade invasive ductal carcinoma associated with desmoplastic stroma.  No in situ component was seen and no definite lymphovascular invasion was identified.  On the core biopsy, the tumor measured about 0.8 cm.  Because of this, she was seen by Dr. [name] and the patient was taken to the [place] [facility] on March 15, 2004.  She underwent a right breast lumpectomy with sentinel node biopsy.  The final pathology revealed an invasive ductal carcinoma, measuring 1.7 cm, grade 2 of 3.  Margins were free  of tumor.  Atypical lobular hyperplasia was noted.  One sentinel node was removed which was negative for metastatic disease.  The tumor was staged at T1c, N0 MX.  It was estrogen receptor positive, progesterone receptor positive.  HER-2/neu was 2+.  FISH was negative.  All margins were free of tumor.  She is now seen in Medical Oncology for further evaluation and management of this newly diagnosed  T1c, node negative, stage I, invasive ductal carcinoma of the right breast."  Her subsequent history is as detailed below   PAST MEDICAL HISTORY: Past Medical History:  Diagnosis Date  . Bone metastases ([place]) dx'd 05/2014  . Breast cancer ([place]) dx'd 2005/2011  . Peripheral vascular disease ([place]) 02/2010   blood clot related to porta cath  . PONV (postoperative nausea and vomiting)   . S/P radiation therapy 07/17/2014 through 08/02/2014    Left mediastinum, left seventh rib 3250 cGy in 13 sessions   . S/P radiation therapy 12/11/2014 through 12/22/2014    Left parietal calvarium 2400 cGy in 8 sessions   . Seizures ([place]) 2010   Isolated incident.    PAST SURGICAL HISTORY: Past Surgical History:  Procedure Laterality Date  . AXILLARY LYMPH NODE DISSECTION  Dec. 2011  . BREAST LUMPECTOMY  2005  . MEDIASTINOTOMY CHAMBERLAIN MCNEIL Left 06/02/2013   Procedure: MEDIASTINOTOMY CHAMBERLAIN MCNEIL;  Surgeon: [name], MD;  Location: [facility];  Service: Thoracic;  Laterality: Left;  LEFT ANTERIOR MEDIASTINOTOMY   . PORTACATH PLACEMENT  12/11  . removal portacath      FAMILY HISTORY Family History  Problem Relation Age of Onset  . COPD Mother   . Breast cancer Sister 23   The patient's father is living, 72 years old as of may 2015. He lives in [place]. The patient's mother died from complications of COPD at the age of 41. These has 2 brothers, one sister. Her sister developed breast cancer at the age of 64. She is doing well. The patient herself underwent genetic testing at [facility] in 2011 and was found to be BRCA negative  GYNECOLOGIC HISTORY:  Menarche age 35, she is GX P0. She stopped having periods with her initial chemotherapy in 2006.  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 Her husband [name] [name] is a Medical illustrator in [place]. He has a child from a prior marriage. At home they have 2 rescue dogs, Hobo and Mokane. The patient is religious but not a church attender    ADVANCED DIRECTIVES: In place; at the 08/04/2014 visit in particular the patient was very clear, with her husband present, that she would not want any kind of feeding tubes or "other tubes" if her condition deteriorated.   HEALTH MAINTENANCE: Social History  Substance Use Topics  . Smoking status: Never Smoker  . Smokeless tobacco: Never Used  . Alcohol use No     Colonoscopy:  PAP:  Bone density: March 2015; mild osteopenia  Lipid panel:  Allergies  Allergen Reactions  . 2nd Skin Quick Heal Other (See Comments)    Other Reaction: Skin peels  . Decadron [Dexamethasone] Other (See Comments)    Patient does not tolerate steroids.   . Dilaudid [Hydromorphone] Nausea And Vomiting  . Enoxaparin Other (See Comments)    unknown  . Fluconazole Swelling    Liver toxicity  . Hydromorphone Hcl Nausea And Vomiting  . Morphine And Related Nausea And Vomiting  .  Protonix [Pantoprazole Sodium] Other (See Comments)    Patient reports it caused thrush.  . Tegaderm Ag Mesh [Silver]     Current Outpatient Prescriptions  Medication Sig Dispense Refill  . ALPRAZolam (XANAX) 0.5 MG tablet Take 1 tablet (0.5 mg total) by mouth 2 (two) times daily as needed for anxiety. 30 tablet 0  . B Complex-C (B-COMPLEX WITH VITAMIN C) tablet Take 1 tablet by mouth daily. Reported on 03/27/2015    . calcium carbonate (TUMS - DOSED IN MG ELEMENTAL CALCIUM) 500 MG chewable tablet Chew 1 tablet by mouth as directed.    . cholecalciferol 2000 UNITS tablet Take 1 tablet (2,000 Units total) by mouth daily.    . ciprofloxacin (CIPRO) 500 MG tablet Take 1 tablet (500 mg total) by mouth 2 (two) times daily. 6 tablet 0  . Diphenhyd-Hydrocort-Nystatin (FIRST-DUKES MOUTHWASH) SUSP 5-10 ml qid SWISH  AND SPIT 885 mL 3  . folic acid (FOLVITE) 1 MG tablet Take 1 tablet (1 mg total) by mouth daily.    Marland Kitchen loratadine-pseudoephedrine (CLARITIN-D 24-HOUR) 10-240 MG 24 hr tablet Take 1 tablet by mouth daily.    . Melatonin 3 MG TABS Take 3 mg by mouth at bedtime.    . naproxen sodium (ANAPROX) 220 MG tablet Take 220 mg by mouth 2 (two) times daily with a meal.    . palbociclib (IBRANCE) 75 MG capsule Take 1 capsule (75 mg total) by mouth daily with breakfast. Take whole with food. 21 capsule 6  . phenazopyridine (PYRIDIUM) 200 MG tablet Take 1 tablet (200 mg total) by mouth 3 (three) times daily as needed for pain. 30 tablet 1  . RABEprazole Sodium 5 MG CPSP Take 5 mg by mouth daily. 30 capsule 3  . saccharomyces boulardii (FLORASTOR) 250 MG capsule Take 250 mg by mouth daily.      No current facility-administered medications for this visit.     OBJECTIVE: Middle-aged white woman Who appears stated age  There were no vitals filed for this visit.   There is no height or weight on file to calculate BMI.   There were no vitals filed for this visit.   Patient refused vitals today 02/20/2016    ECOG FS: 1  Sclerae unicteric, EOMs intact Oropharynx clear and moist No cervical or supraclavicular adenopathy Lungs no rales or rhonchi Heart regular rate and rhythm Abd soft, nontender, positive bowel sounds MSK no focal spinal tenderness to moderate palpation, no upper extremity lymphedema Neuro: nonfocal, well oriented, appropriate affect Breasts: Deferred     LAB RESULTS:   CMP     Component Value Date/Time   NA 137 02/11/2016 1152   K 3.9 02/11/2016 1152   CL 103 12/14/2014 0800   CL 105 05/06/2012 1333   CO2 26 02/11/2016 1152   GLUCOSE 85 02/11/2016 1152   GLUCOSE 124 (H) 05/06/2012 1333   BUN 10.2 02/11/2016 1152   CREATININE 0.8 02/11/2016 1152   CALCIUM 10.1 02/11/2016 1152   PROT 7.5 02/11/2016 1152   ALBUMIN 3.8 02/11/2016 1152   AST 28 02/11/2016 1152   ALT 29  02/11/2016 1152   ALKPHOS 74 02/11/2016 1152   BILITOT 0.38 02/11/2016 1152   GFRNONAA >60 12/14/2014 0800   GFRAA >60 12/14/2014 0800    No results found for: SPEP  Lab Results  Component Value Date   WBC 2.6 (L) 02/18/2016   NEUTROABS 1.8 02/18/2016   HGB 11.5 (L) 02/18/2016   HCT 32.4 (L) 02/18/2016   MCV 103.5 (H)  02/18/2016   PLT 217 02/18/2016      Chemistry      Component Value Date/Time   NA 137 02/11/2016 1152   K 3.9 02/11/2016 1152   CL 103 12/14/2014 0800   CL 105 05/06/2012 1333   CO2 26 02/11/2016 1152   BUN 10.2 02/11/2016 1152   CREATININE 0.8 02/11/2016 1152      Component Value Date/Time   CALCIUM 10.1 02/11/2016 1152   ALKPHOS 74 02/11/2016 1152   AST 28 02/11/2016 1152   ALT 29 02/11/2016 1152   BILITOT 0.38 02/11/2016 1152     No results for input(s): INR in the last 168 hours.  Urinalysis    Component Value Date/Time   COLORURINE YELLOW 11/17/2014 0143   APPEARANCEUR CLOUDY (A) 11/17/2014 0143   LABSPEC 1.005 01/02/2016 1127   PHURINE 7.0 01/02/2016 1127   PHURINE 6.5 11/17/2014 0143   GLUCOSEU Negative 01/02/2016 1127   HGBUR Trace 01/02/2016 1127   HGBUR NEGATIVE 11/17/2014 0143   BILIRUBINUR Negative 01/02/2016 1127   KETONESUR Negative 01/02/2016 1127   KETONESUR NEGATIVE 11/17/2014 0143   PROTEINUR Negative 01/02/2016 1127   PROTEINUR NEGATIVE 11/17/2014 0143   UROBILINOGEN 0.2 01/02/2016 1127   NITRITE Negative 01/02/2016 1127   NITRITE NEGATIVE 11/17/2014 0143   LEUKOCYTESUR Trace 01/02/2016 1127     Ref. Range 11/19/2015 11:46 12/17/2015 11:30 01/14/2016 11:47 01/30/2016 11:55 02/11/2016 11:52  CA 27.29 Latest Ref Range: 0.0 - 38.6 U/mL 1,572.2 (H) 1,754.1 (H) 2,432.2 (H) 2,042.4 (H) 2,463.9 (H)    STUDIES: Mr Thoracic Spine W Wo Contrast  Addendum Date: 02/15/2016   ADDENDUM REPORT: 02/15/2016 13:11 ADDENDUM: The left posterior tenth rib bone lesion is unchanged compared with PET-CT dated 09/05/2015 accounting for  differences in modality. The T2 vertebral body bone lesion is unchanged compared with PET-CT dated 09/05/2015 accounting for differences in modality. The T11 vertebral body bone lesion is enlarged compared with MR abdomen 11/23/2015 consistent with disease progression. Electronically Signed   By: Kathreen Devoid   On: 02/15/2016 13:11   Result Date: 02/15/2016 CLINICAL DATA:  Breast cancer.  Evaluate for bony metastasis. EXAM: MRI THORACIC SPINE WITHOUT AND WITH CONTRAST TECHNIQUE: Multiplanar and multiecho pulse sequences of the thoracic spine were obtained without and with intravenous contrast. CONTRAST:  65m MULTIHANCE GADOBENATE DIMEGLUMINE 529 MG/ML IV SOLN COMPARISON:  None. FINDINGS: Alignment:  Physiologic. Vertebrae: No fracture or discitis. 10 mm T1 hypo intense enhancing T2 vertebral body bone lesion. 12 x 18 mm T1 hypointense enhancing T11 vertebral body bone lesion posteriorly extending into the right pedicle. T1 hypo intense enhancing bone lesion in the left posterior tenth rib. Cord:  Normal signal and morphology. Paraspinal and other soft tissues: No paraspinal soft tissue abnormality. Bandlike area of airspace disease in the left upper lobe consistent with radiation fibrosis. Left lower lobe pleural based 4.1 x 2.2 cm soft tissue mass better evaluated on recent PET-CT dated 11/26/2015. Multiple hepatic masses concerning for metastatic disease. Cholelithiasis. Disc levels: Disc spaces:  Disc spaces are maintained. T1-T2: No disc protrusion, foraminal stenosis or central canal stenosis. T2-T3: No disc protrusion, foraminal stenosis or central canal stenosis. T3-T4: No disc protrusion, foraminal stenosis or central canal stenosis. T4-T5: No disc protrusion, foraminal stenosis or central canal stenosis. T5-T6: Small right paracentral disc protrusion. No foraminal or central canal stenosis. T6-T7: No disc protrusion, foraminal stenosis or central canal stenosis. T7-T8: No disc protrusion, foraminal  stenosis or central canal stenosis. T8-T9: No disc protrusion, foraminal stenosis or central  canal stenosis. T9-T10: Shallow left paracentral disc protrusion. No foraminal or central canal stenosis. T10-T11: No disc protrusion, foraminal stenosis or central canal stenosis. T11-T12: Mild broad-based disc bulge with a shallow left paracentral disc protrusion. No foraminal or central canal stenosis. IMPRESSION: 1. T2 and T11 vertebral body bone lesions most consistent with metastatic disease given the patient's history of breast cancer. 2. Left posterior tenth rib bone lesion most consistent with metastatic disease. 3. Mild thoracic spine spondylosis as described above. 4. Multiple hepatic masses concerning for metastatic disease. These are better evaluated on abdominal MRI performed same day. Electronically Signed: By: Kathreen Devoid On: 02/15/2016 08:26   Mr Abdomen Wwo Contrast  Result Date: 02/15/2016 CLINICAL DATA:  History of breast cancer with mediastinal nodal, left pleural, hepatic, and abdominal nodal metastasis. EXAM: MRI ABDOMEN WITHOUT AND WITH CONTRAST TECHNIQUE: Multiplanar multisequence MR imaging of the abdomen was performed both before and after the administration of intravenous contrast. CONTRAST:  5 cc of MultiHance COMPARISON:  Abdominal MRI of 11/23/2015. FINDINGS: Portions of exam are mildly motion degraded. Lower chest: Similar appearance of left-sided pleural fluid and nodularity. Left pleural implant measures 1.5 cm on image 9/ series 12001 and is not significantly changed. The area of pleural thickening likely extends into the left neural foramina at approximately the T9-10 level on image 13/series 3. This is unchanged. Normal heart size without pericardial or pleural effusion. Hepatobiliary: Hepatic metastasis are best evaluated on series 12001. Index central right hepatic lobe lesion measures 2.7 x 2.2 cm on image 42/ series 12001. Compare 2.5 x 2.2 cm on the prior exam. More lateral  right hepatic lobe mass measures 4.5 x 3.8 cm on image 46/series 12001. Compare 3.9 x 2.7 cm previously. A right hepatic lobe pericholecystic lesion measures 1.8 x 1.6 cm on image 55/series 12001. Compare 1.7 x 1.3 cm on the prior exam (when remeasured). Inferior right hepatic lobe 12 mm lesion on image 76/ series 12001 measured 6 mm on the prior exam (when remeasured). Again identified is a gallstone without acute cholecystitis or biliary duct dilatation. Pancreas:  Normal, without mass or ductal dilatation. Spleen:  Normal in size, without focal abnormality. Adrenals/Urinary Tract: Normal adrenal glands. Normal kidneys, without hydronephrosis. Stomach/Bowel: Normal stomach and abdominal bowel loops. Vascular/Lymphatic: Normal caliber of the aorta and branch vessels. Portal caval node measures 7 mm on image 22/series 3 and is decreased from 10 mm on the prior exam.Left periaortic retroperitoneal adenopathy measures 11 x 9 mm on image 48/ series 12001. Compare 12 x 12 mm on the prior exam. Other:  No ascites.  No evidence of omental or peritoneal disease. Musculoskeletal: Osseous metastasis. A right-sided T11 lesion measures 1.9 cm on image 18/series 3 versus 1.3 cm on the prior exam.Right-sided lamina lesion at L1 measures 11 mm on image 31/series 3 versus 10 mm on the prior.Inferior L3 lesion measures 1.7 x 3.3 cm on image 38/series 13 and is similar to the prior exam (when remeasured). IMPRESSION: 1. Mild progression of hepatic metastasis. 2. Similar to slight improvement in abdominal adenopathy. 3. Similar but incompletely imaged left pleural metastasis. 4. Primarily similar osseous metastasis. A right-sided T11 lesion appears enlarged since the prior exam. 5. Minimal motion degradation. 6. Cholelithiasis. Electronically Signed   By: Abigail Miyamoto M.D.   On: 02/15/2016 12:34    ASSESSMENT: 55 y.o. BRCA negative [place] woman with stage IV breast cancer, history as follows  (1)  S/p Right upper inner  quadrant lumpectomy and sentinel lymph node sampling  03/15/2004 for a pT1c pN0. Stage IA invasive ductal carcinoma, grade 2, estrogen receptor 95% positive, progesterone receptor 65% positive, HER-2 not amplified; additional surgery 04/25/2004 for seroma or clearance showed no residual tumor  (2) adjuvant chemotherapy with cyclophosphamide and doxorubicin every 21 days x4 completed 07/19/2004  (3) adjuvant radiation given under Dr. Donella Stade in [place] completed July 2006  (4) the patient opted against adjuvant antiestrogen therapy  (5) genetics testing showed no BRCA mutations  (6) biopsy of a palpable right axillary mass 10/24/2009 showed invasive ductal carcinoma, grade 3, estrogen receptor 100% positive, progesterone receptor 2% positive (alert score 5) HER-2 negative; no evidence of systemic disease on PET scanning  (7) completed 3 of 4 planned cycles of docetaxel and cyclophosphamide September 2011, fourth cycle omitted because of marked elevations in liver function tests  (8) an right axillary lymph node dissection 03/06/2010 showed 3/8 lymph nodes removed to be involved by tumor, with extracapsular extension.  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, TTS-1 negative), estrogen receptor 80% positive, progesterone receptor 2% positive, HER-2 not amplified  (12) letrozole started March 2015-- tolerated with significant side effects, discontinued at the end of May 2015  (13) PET scan 08/16/2013 shows extensive left pleural metastatic disease and a large left pleural effusion that shifts  cardiac and mediastinal structures to the right; adenopathy (celiac trunk, periadrenal, periaortic); and a left medial clavicular lesion; Status post left thoracentesis 08/16/2013 positive for adenocarcinoma, estrogen receptor positive, progesterone receptor negative.  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Herrick, a complex rearrangement in PIK3R2, and amplification ofPIK3C2B]],  amplification of MCL1 and MDM4, anda MAP2K4 R287H mutation; everolimus was suggested as an available targeted agent  (18) exemestane started 03/31/2014, discontinued 10/31/2014 with evidence of progression  (a) everolimus added 04/03/2014 but not tolerated (cytopenias, elevated LFTs) even at minimal doses; stopped 04/17/2014  (19) fulvestrant started 12/20/2014  (a) palbociclib added at very low dose 04/03/2015 (starting dose 75 mg weekly)  (b) palbociclib dose gradually increased to 75 mg daily, 21/7, as of May 2017  (20) liver biopsy 03/20/2015 confirms metastatic carcinoma, still estrogen receptor positive at 100%, progesterone receptor negative, HER-2 equivocal with a signals ratio 1.41, number per cell 4.50.   (a) repeat liver biopsy December 2017 might show further changes (HER-2 positivity)  (21) immunohistochemistry for mismatched repair protein mutations 03/20/2015 showed normal major and minor MMR proteins, with a very low probability of microsatellite instability ([id number])  (22) adjuvant radiation 12/24/15-01/02/16 Site/dose:   1) Left T9 Rib / 24 Gy in 8 fx                         2) Right inferior pelvis/ 24 Gy in 8 fx  (23) consider pembrolizumab starting  02/29/2016 (obtained compassionate release from company)    ASSOCIATED CONCERNS:  (a) history of isolated seizure April 2010, with negative  workup  (b) 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 treated with Lovenox for 5-6 months  (c) right upper extremity lymphedema--receiving physical therapy  (d) hepatic steatosis with chronically elevated LFTs as well as unusual hepatic sensitivity to chemotherapy  (e) osteopenia with the lowest T score -1.6 on bone density scan 06/20/2013  (i) on denosumab/ Xgeva Q28d  (f) radiation oncology (Dr Valere Dross) has reviewed prior radiation records in case there is further mediastinal involvement with dysphagia etc in which case palliative XRT could be considered  (a) radiation to left mediastinum/ left 7th rib 3250 cGy in 13 sessions04/18/2016 through 08/02/2014  (b) radiation to T11 area: 22 Gy in 7 sessions, last dose 11/27/2014  (c) radiation left parietal scalp region to be completed 12/22/2014  (d) radiation to sacral area completed 04/09/2015  (e) radiation to right inferior pelvis and left ninth rib (24 gray, 12/24/2015--01/02/2016)  (g) chest wall and perineal pain--improved post radiation treatments  (a) discussed celebrex/ carafate but holding off for now  (h) zoster diagnosed 04/04/2015-- on valacyclovir   PLAN: Rivers is encouraged that there have been no new bone lesions in 6 months on the current treatment, but discouraged that there has been some evidence of growth in the liver. We spent well over an hour today going over her options.  She is very clear on what she wishes to do, namely the minimum necessary to control her tumor. This is exactly the right goal.  I think we have 3 options. We can add an aromatase inhibitor to her current treatment, most likely letrozole. We can intensify the palbociclib, since she has maintained an excellent neutrophil count throughout: For example we could try to do 100 mg the  third week of each 21 day treatment. Alternatively we could switch to pembrolizumab and we have obtained compassionate release from the company for her if she wishes to pursue that route.  [name] [name] a really couldn't decide what to do. She is terrified that the letrozole may affect her liver. She doesn't know if she would be able to tolerate any more palbociclib and she is currently taking. Of course she has no idea neither too eye how she will tolerate the pembrolizumab. All this makes it very difficult for her to decide which of these options to pursue. We also reviewed her scans in detail and also her CA-27-29 results which are copied above  In the end what she would like to do is move her PET scan up and we will try to get that down for her this coming weekend. She understands the difficulty in interpreting the findings at T11 and there is also on certainty whether the shooting pain she has occasionally in her left chest wall has anything to do with the T11 lesion. She understands she has already had radiation to that area and it is not clear she would benefit from further radiation, with a port including the spine.  Most likely she may be willing to add a very little extra palbociclib (substituting a 100 mg dose for this 75 mg dose once a week the final 2 weeks of this cycle)--this is the way we were able to increase the palbociclib dose to the current level.  The plan at this point then is to move up the PET scan then discuss results with Dr. Lisbeth Renshaw in radiation oncology 02/28/2016, and then see me the next day 02/29/2016 to decide on systemic therapy changes  She has a good understanding of this plan.  She will call with any problems that may develop before her next visit here.   Chauncey Cruel, MD   02/23/2016 4:13 PM

## 2016-02-22 ENCOUNTER — Ambulatory Visit (HOSPITAL_COMMUNITY)
Admission: RE | Admit: 2016-02-22 | Discharge: 2016-02-22 | Disposition: A | Discharge status: Home / Self Care | Payer: 59 | Source: Ambulatory Visit | Attending: Oncology | Admitting: Oncology

## 2016-02-22 ENCOUNTER — Other Ambulatory Visit: Payer: Self-pay | Admitting: *Deleted

## 2016-02-22 ENCOUNTER — Ambulatory Visit: Payer: 59

## 2016-02-22 ENCOUNTER — Encounter: Payer: 59 | Admitting: Physical Therapy

## 2016-02-22 DIAGNOSIS — C771 Secondary and unspecified malignant neoplasm of intrathoracic lymph nodes: Secondary | ICD-10-CM

## 2016-02-22 DIAGNOSIS — C50211 Malignant neoplasm of upper-inner quadrant of right female breast: Secondary | ICD-10-CM | POA: Diagnosis present

## 2016-02-22 DIAGNOSIS — C787 Secondary malignant neoplasm of liver and intrahepatic bile duct: Secondary | ICD-10-CM | POA: Insufficient documentation

## 2016-02-22 DIAGNOSIS — C7951 Secondary malignant neoplasm of bone: Secondary | ICD-10-CM

## 2016-02-22 LAB — GLUCOSE, CAPILLARY: Glucose-Capillary: 88 mg/dL (ref 65–99)

## 2016-02-22 MED ORDER — FLUDEOXYGLUCOSE F - 18 (FDG) INJECTION
7.9800 | Freq: Once | INTRAVENOUS | Status: AC | PRN
  Administered 2016-02-22: 7.96 via INTRAVENOUS

## 2016-02-22 NOTE — Progress Notes (Signed)
Patient refuses VS and assessment. 20G placed in left wrist for PET scan, Saline Locked. Discharged ambulatory in no acute distress.

## 2016-02-23 DIAGNOSIS — C787 Secondary malignant neoplasm of liver and intrahepatic bile duct: Secondary | ICD-10-CM | POA: Insufficient documentation

## 2016-02-25 ENCOUNTER — Other Ambulatory Visit (HOSPITAL_BASED_OUTPATIENT_CLINIC_OR_DEPARTMENT_OTHER): Payer: 59

## 2016-02-25 ENCOUNTER — Ambulatory Visit: Payer: 59 | Admitting: Physical Therapy

## 2016-02-25 DIAGNOSIS — M62838 Other muscle spasm: Secondary | ICD-10-CM | POA: Diagnosis not present

## 2016-02-25 DIAGNOSIS — I890 Lymphedema, not elsewhere classified: Secondary | ICD-10-CM

## 2016-02-25 DIAGNOSIS — C50211 Malignant neoplasm of upper-inner quadrant of right female breast: Secondary | ICD-10-CM | POA: Diagnosis not present

## 2016-02-25 DIAGNOSIS — M79651 Pain in right thigh: Secondary | ICD-10-CM

## 2016-02-25 DIAGNOSIS — C771 Secondary and unspecified malignant neoplasm of intrathoracic lymph nodes: Secondary | ICD-10-CM | POA: Diagnosis not present

## 2016-02-25 DIAGNOSIS — J90 Pleural effusion, not elsewhere classified: Secondary | ICD-10-CM

## 2016-02-25 LAB — CBC WITH DIFFERENTIAL/PLATELET
BASO%: 2 % (ref 0.0–2.0)
BASOS ABS: 0.1 10*3/uL (ref 0.0–0.1)
EOS%: 1.7 % (ref 0.0–7.0)
Eosinophils Absolute: 0.1 10*3/uL (ref 0.0–0.5)
HCT: 33.6 % — ABNORMAL LOW (ref 34.8–46.6)
HGB: 11.9 g/dL (ref 11.6–15.9)
LYMPH%: 21.1 % (ref 14.0–49.7)
MCH: 37.1 pg — ABNORMAL HIGH (ref 25.1–34.0)
MCHC: 35.4 g/dL (ref 31.5–36.0)
MCV: 104.7 fL — AB (ref 79.5–101.0)
MONO#: 0.5 10*3/uL (ref 0.1–0.9)
MONO%: 17.7 % — AB (ref 0.0–14.0)
NEUT#: 1.7 10*3/uL (ref 1.5–6.5)
NEUT%: 57.5 % (ref 38.4–76.8)
NRBC: 0 % (ref 0–0)
Platelets: 251 10*3/uL (ref 145–400)
RBC: 3.21 10*6/uL — AB (ref 3.70–5.45)
RDW: 13.3 % (ref 11.2–14.5)
WBC: 3 10*3/uL — AB (ref 3.9–10.3)
lymph#: 0.6 10*3/uL — ABNORMAL LOW (ref 0.9–3.3)

## 2016-02-25 NOTE — Therapy (Signed)
[place], [place], [address] Phone: [phone]   Fax:  [phone]  Physical Therapy Treatment  Patient Details  Name: [name] [name] MRN: [id number] Date of Birth: 1960/10/26 Referring Provider: Dr. Kyung Rudd  Encounter Date: 02/25/2016      PT End of Session - 02/25/16 1236    Visit Number [id number] for lymph   Number of Visits 95  for lymph   Date for PT Re-Evaluation 03/03/16   Authorization - Visit Number 140   PT Start Time 1019   PT Stop Time 1104   PT Time Calculation (min) 45 min   Activity Tolerance Patient tolerated treatment well   Behavior During Therapy [facility] for tasks assessed/performed      Past Medical History:  Diagnosis Date  . Bone metastases ([place]) dx'd 05/2014  . Breast cancer ([place]) dx'd 2005/2011  . Peripheral vascular disease ([place]) 02/2010   blood clot related to porta cath  . PONV (postoperative nausea and vomiting)   . S/P radiation therapy 07/17/2014 through 08/02/2014    Left mediastinum, left seventh rib 3250 cGy in 13 sessions   . S/P radiation therapy 12/11/2014 through 12/22/2014    Left parietal calvarium 2400 cGy in 8 sessions   . Seizures ([place]) 2010   Isolated incident.    Past Surgical History:  Procedure Laterality Date  . AXILLARY LYMPH NODE DISSECTION  Dec. 2011  . BREAST LUMPECTOMY  2005  . MEDIASTINOTOMY CHAMBERLAIN MCNEIL Left 06/02/2013   Procedure: MEDIASTINOTOMY CHAMBERLAIN MCNEIL;  Surgeon: [name], MD;  Location: [facility];  Service: Thoracic;  Laterality: Left;  LEFT ANTERIOR MEDIASTINOTOMY   . PORTACATH PLACEMENT  12/11  . removal portacath      There were no vitals filed for this visit.      Subjective Assessment - 02/25/16 1021    Subjective Having shooting pains at  front of right leg. Had increased swelling in right hand and fingers today.   Currently in Pain? Yes   Pain Score 6    Pain Location Axilla   Pain Orientation Right   Pain Score 1  up to 10 with shooting pains   Pain Location --  thigh   Pain Orientation Right   Pain Descriptors / Indicators Tightness                         [facility] Adult PT Treatment/Exercise - 02/25/16 0001      Manual Therapy   Myofascial Release right axilla with focus on scar tightness   Manual Lymphatic Drainage (MLD) In left sidelying, posterior interaxillary anastomosis and right axillo-inguinal anastomosis; in supine, short neck, left axilla and anterior interaxillary anastomosis, right groin and axillo-inguinal anastomosis, and right upper extremity from fingers to shoulder.  In right sidelying, left periscapular area toward left groin.   Other Manual Therapy soft tissue work at superior aspect of right medial and upper thigh in prone position for pain relief and muscle relaxation, gentle myofascial release at upper thigh                  PT Short Term Goals - 02/12/16 1226      PT SHORT TERM GOAL #1   Title pain with walking decreased >/= 25%   Time 4   Period Weeks   Status Achieved           PT Long Term Goals - 02/12/16 1226  PT LONG TERM GOAL #1   Title indpendent with HEP   Time 8   Period Weeks   Status On-going     PT LONG TERM GOAL #2   Title pain with walking decreased >/= 75%   Time 8   Period Weeks   Status On-going     PT LONG TERM GOAL #3   Title ability to flex her right hip with right groin pain decreased >/= 50% due to improved tissue mobility   Time 8   Period Weeks   Status On-going           Long Term Clinic Goals - 02/15/16 1212      CC Long Term Goal  #2   Title Pt. will report swelling is adequately managed to enable ADL function at a consistent level.   Status On-going     CC Long Term Goal  #4   Title Pain/discomfort at  right axilla area will be controlled at 6/10 or less.   Status On-going     CC Long Term Goal  #5   Title Patient will avoid infection by ongoing management of her lymphedema at right breast/axilla/upper arm areas.   Status On-going            Plan - 02/25/16 1237    Clinical Impression Statement Again had flareup of hand swelling, and although she reports it has improved, there is visible swelling, particularly at the third finger.   Rehab Potential Good   Clinical Impairments Affecting Rehab Potential active cancer   PT Frequency 2x / week   PT Duration 12 weeks   PT Treatment/Interventions Manual techniques;Manual lymph drainage   PT Next Visit Plan Remeasure arm; soft tissue work, myofascial release, manual lymph drainage for lymphedema and external painful areas   Consulted and Agree with Plan of Care Patient      Patient will benefit from skilled therapeutic intervention in order to improve the following deficits and impairments:  Increased edema, Increased fascial restricitons, Pain  Visit Diagnosis: Lymphedema, not elsewhere classified  Pain in right thigh     Problem List Patient Active Problem List   Diagnosis Date Noted  . Liver metastases ([place]) 02/23/2016  . 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 Zoster 04/04/2015  . Nausea with vomiting 11/18/2014  . Constipation 11/18/2014  . Left-sided thoracic back pain   . Bone metastases ([place]) 11/16/2014  . Back pain 11/15/2014  . Uncontrolled pain 11/14/2014  . Post-lymphadenectomy lymphedema of arm 05/31/2014  . Chest wall pain 03/21/2014  . Abnormal LFTs (liver function tests) 09/12/2013  . Breast cancer of upper-inner quadrant of right female breast ([place]) 08/18/2013  . 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

## 2016-02-26 ENCOUNTER — Encounter: Payer: Self-pay | Admitting: Physical Therapy

## 2016-02-26 ENCOUNTER — Ambulatory Visit: Payer: 59 | Admitting: Physical Therapy

## 2016-02-26 DIAGNOSIS — M62838 Other muscle spasm: Secondary | ICD-10-CM | POA: Diagnosis not present

## 2016-02-26 NOTE — Therapy (Signed)
[facility] [address], [place], [place], [address] Phone: [phone]   Fax:  [phone]  Physical Therapy Treatment  Patient Details  Name: ANNAKATE [name] MRN: [id number] Date of Birth: 08-28-1960 Referring Provider: Dr. Kyung Rudd  Encounter Date: 02/26/2016      PT End of Session - 02/26/16 1152    Visit Number 129  pelvis   Date for PT Re-Evaluation 03/11/16  pelvis   Authorization - Visit Number 140   Authorization - Number of Visits 129  pelvis   PT Start Time [id number]   PT Stop Time 1226   PT Time Calculation (min) 38 min   Activity Tolerance Patient tolerated treatment well   Behavior During Therapy [facility] for tasks assessed/performed      Past Medical History:  Diagnosis Date  . Bone metastases ([place]) dx'd 05/2014  . Breast cancer ([place]) dx'd 2005/2011  . Peripheral vascular disease ([place]) 02/2010   blood clot related to porta cath  . PONV (postoperative nausea and vomiting)   . S/P radiation therapy 07/17/2014 through 08/02/2014    Left mediastinum, left seventh rib 3250 cGy in 13 sessions   . S/P radiation therapy 12/11/2014 through 12/22/2014    Left parietal calvarium 2400 cGy in 8 sessions   . Seizures ([place]) 2010   Isolated incident.    Past Surgical History:  Procedure Laterality Date  . AXILLARY LYMPH NODE DISSECTION  Dec. 2011  . BREAST LUMPECTOMY  2005  . MEDIASTINOTOMY CHAMBERLAIN MCNEIL Left 06/02/2013   Procedure: MEDIASTINOTOMY CHAMBERLAIN MCNEIL;  Surgeon: [name], MD;  Location: [facility];  Service: Thoracic;  Laterality: Left;  LEFT ANTERIOR MEDIASTINOTOMY   . PORTACATH PLACEMENT  12/11  . removal portacath      There were no vitals filed for this visit.      Subjective Assessment - 02/26/16 1148    Subjective I  have shooting pain.  It started from the hard bowel movement last Thursday.  I did well before then.  Butch Penny worked on the area and pulled the leg which helped.  I do not have the burning pain from the cancer.  I only have the soreness.   pelvis   How long can you walk comfortably? more she walks patient will have increased pain that stops her from walking further  pelvis   Diagnostic tests x rays of back show no fractures;   pelvis   Patient Stated Goals walk 20 minutes with minimal limp  pelvis   Currently in Pain? Yes   Pain Score 10-Worst pain ever  constant soreness at 1/10   Pain Location Vagina  pelvis   Pain Orientation Mid  pelvis   Pain Descriptors / Indicators Shooting;Sore  pelvis   Pain Type Chronic pain  pelvis   Pain Onset More than a month ago  pelvis   Pain Frequency Intermittent  pelvis   Aggravating Factors  walking with carrying purse  pelvis   Pain Relieving Factors therapy  pelvis   Multiple Pain Sites No                      Pelvic Floor Special Questions - 02/26/16 0001    Pelvic Floor Internal Exam Patient approves physical therapist to perform pelvic floor muscle assessment   Exam Type Vaginal           [facility] Adult PT Treatment/Exercise - 02/26/16 0001      Manual Therapy   Manual  Therapy Internal Pelvic Floor   Internal Pelvic Floor left sidely soft tissue work on left side of puborectalis and obturator internist and along the left side of introitus; right coccygeus, right levator ani, along right rami, right compressor sphincter                PT Education - 02/26/16 1225    Education provided No  pelvis          PT Short Term Goals - 02/12/16 1226      PT SHORT TERM GOAL #1   Title pain with walking decreased >/= 25%   Time 4   Period Weeks   Status Achieved           PT Long Term Goals - 02/26/16 1231      PT LONG TERM GOAL #1   Title indpendent with HEP   Time 8   Period Weeks   Status  On-going     PT LONG TERM GOAL #2   Title pain with walking decreased >/= 75%   Time 8   Period Weeks   Status On-going  50%     PT LONG TERM GOAL #3   Title ability to flex her right hip with right groin pain decreased >/= 50% due to improved tissue mobility   Time 8   Period Weeks   Status On-going  25%           Long Term Clinic Goals - 02/15/16 1212      CC Long Term Goal  #2   Title Pt. will report swelling is adequately managed to enable ADL function at a consistent level.   Status On-going     CC Long Term Goal  #4   Title Pain/discomfort at right axilla area will be controlled at 6/10 or less.   Status On-going     CC Long Term Goal  #5   Title Patient will avoid infection by ongoing management of her lymphedema at right breast/axilla/upper arm areas.   Status On-going            Plan - 02/26/16 1225    Clinical Impression Statement Patient had 1.5 weeks with no sharp pain until she had a hard bowel movement. After the bowel movement she had sharp pain in the right pubic area with walking.  Patient would have increased pain with carrying her purse on the left.  After therapy patient had no sharp pain just a soreness. Patient is now able to go 2 weeks without therapy for the pelvic area.  Thickness felt in the pelvic floor muscles.  Pelvic floor strength is 3/5. Patient will benefit from skilled therapy to reduce pain.   pelvis   Rehab Potential Good  pelvis   Clinical Impairments Affecting Rehab Potential active cancer  pelvis   PT Frequency 2x / week  pelvis   PT Duration 8 weeks  pelvis   PT Treatment/Interventions Therapeutic exercise;Therapeutic activities;Manual techniques  pelvis   PT Next Visit Plan write renewal, soft tissue work  pelvis   PT Home Exercise Plan progress as needed  pelvis   Consulted and Agree with Plan of Care Patient  pelvis      Patient will benefit from skilled therapeutic intervention in order to improve the following  deficits and impairments:  Increased edema, Increased fascial restricitons, Pain (pelvis)  Visit Diagnosis: Other muscle spasm     Problem List Patient Active Problem List   Diagnosis Date Noted  . Liver metastases ([place])  02/23/2016  . 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 Zoster 04/04/2015  . Nausea with vomiting 11/18/2014  . Constipation 11/18/2014  . Left-sided thoracic back pain   . Bone metastases ([place]) 11/16/2014  . Back pain 11/15/2014  . Uncontrolled pain 11/14/2014  . Post-lymphadenectomy lymphedema of arm 05/31/2014  . Chest wall pain 03/21/2014  . Abnormal LFTs (liver function tests) 09/12/2013  . Breast cancer of upper-inner quadrant of right female breast ([place]) 08/18/2013  . 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, [place], [place], [address] Phone: [phone]   Fax:  [phone]  Name: [name] [name] MRN: [id number] Date of Birth: [birth year]

## 2016-02-27 ENCOUNTER — Telehealth: Payer: Self-pay | Admitting: *Deleted

## 2016-02-27 NOTE — Telephone Encounter (Signed)
This RN spoke with [name] per call stating concern about ongoing pain " exactly like the T 11 pain I had in 2016 that resulted in being admitted "  Currently pain is managed with Aleve but Maizley states " I need a plan from my team on how we are going to treat this pain and why I am having it "  " I am not going to take opioids and I cannot let this pain take me down like it did before "  Mackenze states she is scheduled to see Dr Lisbeth Renshaw tomorrow and Dr Jannifer Rodney on Friday.  Per discussion Khianna admits that " I am almost in a panic about having this pain and what it did before and I just cannot go back there "  This RN validated patients concerns as well as above will be communicated to attending MDs.  Sophiah obtained a copy of PET results per this RN discussing above concerns.  Note pain in pubic ramis has been relieved per PT under [name].

## 2016-02-28 ENCOUNTER — Ambulatory Visit
Admission: RE | Admit: 2016-02-28 | Discharge: 2016-02-28 | Disposition: A | Discharge status: Home / Self Care | Payer: 59 | Source: Ambulatory Visit | Attending: Radiation Oncology | Admitting: Radiation Oncology

## 2016-02-28 ENCOUNTER — Encounter: Payer: 59 | Admitting: Physical Therapy

## 2016-02-28 DIAGNOSIS — C7951 Secondary malignant neoplasm of bone: Secondary | ICD-10-CM | POA: Diagnosis not present

## 2016-02-28 DIAGNOSIS — Z888 Allergy status to other drugs, medicaments and biological substances status: Secondary | ICD-10-CM | POA: Insufficient documentation

## 2016-02-28 DIAGNOSIS — Z853 Personal history of malignant neoplasm of breast: Secondary | ICD-10-CM | POA: Insufficient documentation

## 2016-02-28 DIAGNOSIS — I739 Peripheral vascular disease, unspecified: Secondary | ICD-10-CM | POA: Insufficient documentation

## 2016-02-28 DIAGNOSIS — Z803 Family history of malignant neoplasm of breast: Secondary | ICD-10-CM | POA: Insufficient documentation

## 2016-02-28 DIAGNOSIS — C782 Secondary malignant neoplasm of pleura: Secondary | ICD-10-CM | POA: Insufficient documentation

## 2016-02-28 DIAGNOSIS — Z885 Allergy status to narcotic agent status: Secondary | ICD-10-CM | POA: Diagnosis not present

## 2016-02-28 DIAGNOSIS — Z923 Personal history of irradiation: Secondary | ICD-10-CM | POA: Insufficient documentation

## 2016-02-28 DIAGNOSIS — Z79899 Other long term (current) drug therapy: Secondary | ICD-10-CM | POA: Insufficient documentation

## 2016-02-28 DIAGNOSIS — Z9221 Personal history of antineoplastic chemotherapy: Secondary | ICD-10-CM | POA: Insufficient documentation

## 2016-02-28 DIAGNOSIS — Z825 Family history of asthma and other chronic lower respiratory diseases: Secondary | ICD-10-CM | POA: Insufficient documentation

## 2016-02-28 DIAGNOSIS — C787 Secondary malignant neoplasm of liver and intrahepatic bile duct: Secondary | ICD-10-CM | POA: Diagnosis not present

## 2016-02-28 NOTE — Progress Notes (Addendum)
Follow up bone mets 9/25.17-01/02/16 left t(rib and right inferior pelvis,  Pet scan 02/22/16 , results, patient refused  Vitals and weight meds,  Pain at T2 right side  Pain,  Also at pelvic region where fracture was, Patient wants to see images of MRI and Pet scan,  Wt Readings from Last 3 Encounters:  08/06/15 160 lb (72.6 kg)  12/14/14 160 lb (72.6 kg)  11/28/14 164 lb 9.6 oz (74.7 kg)

## 2016-02-28 NOTE — Progress Notes (Signed)
Radiation Oncology         [phone] ________________________________  Name: [name] [name] MRN: [id number]  Date: 02/28/2016  DOB: 05/05/1960  Follow-Up Visit Note  CC: Pcp Not In System  Magrinat, Virgie Dad, MD  Diagnosis: Stage IV breast cancer with bone metastases  Interval Since Last Radiation: 2 months  12/24/15-01/02/16: 1) Left T9 Rib / 24 Gy in 8 fractions 2) Right inferior pelvis/ 21 Gy in 7 fractions  03/29/2015 - 04/09/2015: Right inferior pubic ramus metastases were treated to 24 Gy in 4 fractions of 6 Gy.  12/11/2014 - 12/22/2014: Left parietal calvarium 2400 cGy in 8 fractions.  11/17/2014 - 11/27/2014: Lower thoracic spine (T11). 22 Gy in 7 fractions.  07/17/2014 - 08/02/2014: Left mediastinum, left seventh rib. 32.5 Gy in 13 fractions.  Completed March 2012: 45 Gy radiation to the right axillary and right supraclavicular nodal areas, with capecitabine sensitization, by Dr. Sandi Mealy at [facility]  Completed July 2006: Adjuvant radiation given under Dr. Donella Stade in [place] for Stage IA (pT1c, pN0) invasive ductal carcinoma, grade 2, (ER/PR +, HER2 -) of the right breast  Narrative:  The patient returns today for routine follow-up. MRI of the abdomen on 02/14/16 showed mild progression of hepatic metastasis, similar to slight improvement in abdominal adenopathy, and similar but incompletely imaged left pleural metastasis. MRI of the thoracic spine the same day showed an unchanged left posterior tenth rib bone lesion, an unchanged T2 vertebral body bone lesion, and the T11 vertebral body bone lesion is enlarged compared to the MRI of the abdomen on 11/23/15 consistent with disease progression.  The patient followed up with Dr. Jana Hakim on 02/19/16. He is concerned about evidence of metastatic growth in the liver and discussed different systemic options/changes with her.  Restaging PET scan on 02/22/16 showed progression of nodal metastasis in the  chest, mild progression of hepatic metastasis, similar to slight progression of left-sided pleural metastasis, slight decrease in hypermetabolism involving abdominal lymph nodes, and slight improvement in osseous metastasis.  On review of systems, the patient states she has lower back on the right side (T2) and pain at the pelvic region where she had a fracture.  Past Medical History:  Past Medical History:  Diagnosis Date  . Bone metastases ([place]) dx'd 05/2014  . Breast cancer ([place]) dx'd 2005/2011  . Peripheral vascular disease ([place]) 02/2010   blood clot related to porta cath  . PONV (postoperative nausea and vomiting)   . S/P radiation therapy 07/17/2014 through 08/02/2014    Left mediastinum, left seventh rib 3250 cGy in 13 sessions   . S/P radiation therapy 12/11/2014 through 12/22/2014    Left parietal calvarium 2400 cGy in 8 sessions   . Seizures ([place]) 2010   Isolated incident.    Past Surgical History: Past Surgical History:  Procedure Laterality Date  . AXILLARY LYMPH NODE DISSECTION  Dec. 2011  . BREAST LUMPECTOMY  2005  . MEDIASTINOTOMY CHAMBERLAIN MCNEIL Left 06/02/2013   Procedure: MEDIASTINOTOMY CHAMBERLAIN MCNEIL;  Surgeon: [name], MD;  Location: [facility];  Service: Thoracic;  Laterality: Left;  LEFT ANTERIOR MEDIASTINOTOMY   . PORTACATH PLACEMENT  12/11  . removal portacath      Social History:  Social History   Social History  . Marital status: Married    Spouse name: N/A  . Number of children: N/A  . Years of education: N/A   Occupational History  . Not on file.   Social History Main Topics  . Smoking  status: Never Smoker  . Smokeless tobacco: Never Used  . Alcohol use No  . Drug use: No  . Sexual activity: Yes   Other Topics Concern  . Not on file   Social History Narrative  . No  narrative on file    Family History: Family History  Problem Relation Age of Onset  . COPD Mother   . Breast cancer Sister 34    ALLERGIES:  is allergic to 2nd skin quick heal; decadron [dexamethasone]; dilaudid [hydromorphone]; enoxaparin; fluconazole; hydromorphone hcl; morphine and related; protonix [pantoprazole sodium]; and tegaderm ag mesh [silver].  Meds: Current Outpatient Prescriptions  Medication Sig Dispense Refill  . ALPRAZolam (XANAX) 0.5 MG tablet Take 1 tablet (0.5 mg total) by mouth 2 (two) times daily as needed for anxiety. 30 tablet 0  . B Complex-C (B-COMPLEX WITH VITAMIN C) tablet Take 1 tablet by mouth daily. Reported on 03/27/2015    . calcium carbonate (TUMS - DOSED IN MG ELEMENTAL CALCIUM) 500 MG chewable tablet Chew 1 tablet by mouth as directed.    . cholecalciferol 2000 UNITS tablet Take 1 tablet (2,000 Units total) by mouth daily.    . ciprofloxacin (CIPRO) 500 MG tablet Take 1 tablet (500 mg total) by mouth 2 (two) times daily. 6 tablet 0  . Diphenhyd-Hydrocort-Nystatin (FIRST-DUKES MOUTHWASH) SUSP 5-10 ml qid SWISH AND SPIT 916 mL 3  . folic acid (FOLVITE) 1 MG tablet Take 1 tablet (1 mg total) by mouth daily.    Marland Kitchen loratadine-pseudoephedrine (CLARITIN-D 24-HOUR) 10-240 MG 24 hr tablet Take 1 tablet by mouth daily.    . Melatonin 3 MG TABS Take 3 mg by mouth at bedtime.    . naproxen sodium (ANAPROX) 220 MG tablet Take 220 mg by mouth 2 (two) times daily with a meal.    . palbociclib (IBRANCE) 75 MG capsule Take 1 capsule (75 mg total) by mouth daily with breakfast. Take whole with food. 21 capsule 6  . phenazopyridine (PYRIDIUM) 200 MG tablet Take 1 tablet (200 mg total) by mouth 3 (three) times daily as needed for pain. 30 tablet 1  . RABEprazole Sodium 5 MG CPSP Take 5 mg by mouth daily. 30 capsule 3  . saccharomyces boulardii (FLORASTOR) 250 MG capsule Take 250 mg by mouth daily.      No current facility-administered medications for this encounter.      Physical Findings:  vitals were not taken for this visit. The patient is in no acute distress. Patient is alert and oriented. Accompanied by husband on evaluation today The patient refused to have vital signs or her weight taken.   Lab Findings: Lab Results  Component Value Date   WBC 3.0 (L) 02/25/2016   HGB 11.9 02/25/2016   HCT 33.6 (L) 02/25/2016   MCV 104.7 (H) 02/25/2016   PLT 251 02/25/2016     Radiographic Findings: Mr Thoracic Spine W Wo Contrast  Addendum Date: 02/15/2016   ADDENDUM REPORT: 02/15/2016 13:11 ADDENDUM: The left posterior tenth rib bone lesion is unchanged compared with PET-CT dated 09/05/2015 accounting for differences in modality. The T2 vertebral body bone lesion is unchanged compared with PET-CT dated 09/05/2015 accounting for differences in modality. The T11 vertebral body bone lesion is enlarged compared with MR abdomen 11/23/2015 consistent with disease progression. Electronically Signed   By: Kathreen Devoid   On: 02/15/2016 13:11   Result Date: 02/15/2016 CLINICAL DATA:  Breast cancer.  Evaluate for bony metastasis. EXAM: MRI THORACIC SPINE WITHOUT AND WITH CONTRAST TECHNIQUE: Multiplanar and  multiecho pulse sequences of the thoracic spine were obtained without and with intravenous contrast. CONTRAST:  40m MULTIHANCE GADOBENATE DIMEGLUMINE 529 MG/ML IV SOLN COMPARISON:  None. FINDINGS: Alignment:  Physiologic. Vertebrae: No fracture or discitis. 10 mm T1 hypo intense enhancing T2 vertebral body bone lesion. 12 x 18 mm T1 hypointense enhancing T11 vertebral body bone lesion posteriorly extending into the right pedicle. T1 hypo intense enhancing bone lesion in the left posterior tenth rib. Cord:  Normal signal and morphology. Paraspinal and other soft tissues: No paraspinal soft tissue abnormality. Bandlike area of airspace disease in the left upper lobe consistent with radiation fibrosis. Left lower lobe pleural based 4.1 x 2.2 cm soft tissue mass better  evaluated on recent PET-CT dated 11/26/2015. Multiple hepatic masses concerning for metastatic disease. Cholelithiasis. Disc levels: Disc spaces:  Disc spaces are maintained. T1-T2: No disc protrusion, foraminal stenosis or central canal stenosis. T2-T3: No disc protrusion, foraminal stenosis or central canal stenosis. T3-T4: No disc protrusion, foraminal stenosis or central canal stenosis. T4-T5: No disc protrusion, foraminal stenosis or central canal stenosis. T5-T6: Small right paracentral disc protrusion. No foraminal or central canal stenosis. T6-T7: No disc protrusion, foraminal stenosis or central canal stenosis. T7-T8: No disc protrusion, foraminal stenosis or central canal stenosis. T8-T9: No disc protrusion, foraminal stenosis or central canal stenosis. T9-T10: Shallow left paracentral disc protrusion. No foraminal or central canal stenosis. T10-T11: No disc protrusion, foraminal stenosis or central canal stenosis. T11-T12: Mild broad-based disc bulge with a shallow left paracentral disc protrusion. No foraminal or central canal stenosis. IMPRESSION: 1. T2 and T11 vertebral body bone lesions most consistent with metastatic disease given the patient's history of breast cancer. 2. Left posterior tenth rib bone lesion most consistent with metastatic disease. 3. Mild thoracic spine spondylosis as described above. 4. Multiple hepatic masses concerning for metastatic disease. These are better evaluated on abdominal MRI performed same day. Electronically Signed: By: HKathreen DevoidOn: 02/15/2016 08:26   Mr Abdomen Wwo Contrast  Result Date: 02/15/2016 CLINICAL DATA:  History of breast cancer with mediastinal nodal, left pleural, hepatic, and abdominal nodal metastasis. EXAM: MRI ABDOMEN WITHOUT AND WITH CONTRAST TECHNIQUE: Multiplanar multisequence MR imaging of the abdomen was performed both before and after the administration of intravenous contrast. CONTRAST:  5 cc of MultiHance COMPARISON:  Abdominal MRI  of 11/23/2015. FINDINGS: Portions of exam are mildly motion degraded. Lower chest: Similar appearance of left-sided pleural fluid and nodularity. Left pleural implant measures 1.5 cm on image 9/ series 12001 and is not significantly changed. The area of pleural thickening likely extends into the left neural foramina at approximately the T9-10 level on image 13/series 3. This is unchanged. Normal heart size without pericardial or pleural effusion. Hepatobiliary: Hepatic metastasis are best evaluated on series 12001. Index central right hepatic lobe lesion measures 2.7 x 2.2 cm on image 42/ series 12001. Compare 2.5 x 2.2 cm on the prior exam. More lateral right hepatic lobe mass measures 4.5 x 3.8 cm on image 46/series 12001. Compare 3.9 x 2.7 cm previously. A right hepatic lobe pericholecystic lesion measures 1.8 x 1.6 cm on image 55/series 12001. Compare 1.7 x 1.3 cm on the prior exam (when remeasured). Inferior right hepatic lobe 12 mm lesion on image 76/ series 12001 measured 6 mm on the prior exam (when remeasured). Again identified is a gallstone without acute cholecystitis or biliary duct dilatation. Pancreas:  Normal, without mass or ductal dilatation. Spleen:  Normal in size, without focal abnormality. Adrenals/Urinary Tract: Normal adrenal  glands. Normal kidneys, without hydronephrosis. Stomach/Bowel: Normal stomach and abdominal bowel loops. Vascular/Lymphatic: Normal caliber of the aorta and branch vessels. Portal caval node measures 7 mm on image 22/series 3 and is decreased from 10 mm on the prior exam.Left periaortic retroperitoneal adenopathy measures 11 x 9 mm on image 48/ series 12001. Compare 12 x 12 mm on the prior exam. Other:  No ascites.  No evidence of omental or peritoneal disease. Musculoskeletal: Osseous metastasis. A right-sided T11 lesion measures 1.9 cm on image 18/series 3 versus 1.3 cm on the prior exam.Right-sided lamina lesion at L1 measures 11 mm on image 31/series 3 versus 10 mm on  the prior.Inferior L3 lesion measures 1.7 x 3.3 cm on image 38/series 13 and is similar to the prior exam (when remeasured). IMPRESSION: 1. Mild progression of hepatic metastasis. 2. Similar to slight improvement in abdominal adenopathy. 3. Similar but incompletely imaged left pleural metastasis. 4. Primarily similar osseous metastasis. A right-sided T11 lesion appears enlarged since the prior exam. 5. Minimal motion degradation. 6. Cholelithiasis. Electronically Signed   By: Abigail Miyamoto M.D.   On: 02/15/2016 12:34   Nm Pet Image Restag (ps) Skull Base To Thigh  Result Date: 02/25/2016 CLINICAL DATA:  Subsequent treatment strategy for right-sided breast cancer with metastasis to bones, left pleural space, liver, and nodal stations. EXAM: NUCLEAR MEDICINE PET SKULL BASE TO THIGH TECHNIQUE: 8.0 mCi F-18 FDG was injected intravenously. Full-ring PET imaging was performed from the skull base to thigh after the radiotracer. CT data was obtained and used for attenuation correction and anatomic localization. FASTING BLOOD GLUCOSE:  Value: 88 Mg/dl COMPARISON:  PET of 11/26/2015.  Abdominal MRI of 02/14/2016. FINDINGS: NECK No areas of abnormal hypermetabolism. No cervical adenopathy. Lucent lesions within the calvarium are again identified and not hypermetabolic. A left parietal lesion measures 1.8 cm on image 9/series 4 versus 1.9 cm on the prior. CHEST Prevascular node measures 11 mm and a S.U.V. max of 10.9 on image 82/ series 4. Compare 9 mm and a S.U.V. max of 5.2 on the prior exam. Left subpectoral node measures 6 mm and a S.U.V. max of 2.0 on image 82/ series 4. Compare 7 mm and a S.U.V. max of 2.4 on the prior exam. Left internal mammary node measures 8 mm and a S.U.V. max of 5.9 on image 88/ series 4. Compare 6 mm and a S.U.V. max of 4.7 on the prior. Left-sided mediastinal hypermetabolism corresponding to probable nodal tissue. This measures 8 mm and a S.U.V. max of 6.5 on image 83/series 4. Compare similar  in size and a S.U.V. max of 4.4 on the prior. A focus of hypermetabolic left pleural nodularity measures 12 mm and a S.U.V. max of 8.3 on image 105/series 4. Compare similar in size and a S.U.V. max of 6.3 on the prior. Similar appearance of radiation fibrosis in the left apex and left upper lobe. Volume loss with rounded atelectasis in the left lower lobe. Right axillary node dissection. ABDOMEN/PELVIS Hepatic metastasis. Index lesion in the anterior right hepatic lobe measures 4.7 cm and a S.U.V. max of 13.1 on image 118/series 4. On the prior PET, 4.1 cm and a S.U.V. max of 7.4. Similar in size to on the prior MRI, given cross modality comparison. Central right hepatic lobe lesion measures 2.4 cm and a S.U.V. max of 7.5 on image 118/series 4. Compare 1.9 cm and a S.U.V. max of 5.2 on the prior. Aortocaval node measures 8 mm and a S.U.V. max of 6.3  versus 9 mm and a S.U.V. max of 5.5. Left periaortic node measures 9 mm and a S.U.V. max of 3.5 on image 130/series 4. Compare 1.0 cm and a S.U.V. max of 4.5 on the prior. Abdominal aortic atherosclerosis. Mild pelvic floor laxity. No bowel obstruction. SKELETON A right-sided T11 lesion measures a S.U.V. max of 6.2 today versus a S.U.V. max of 11.5 on the prior exam. Vague hypermetabolism surrounding the right inferior pubic ramus measures a S.U.V. max of 4.3 today versus a S.U.V. max of 3.4 on the prior. IMPRESSION: 1. Since the prior PET of 11/26/2015, progression of nodal metastasis within the chest. 2. Mild progression of hepatic metastasis. 3. Similar to slight progression of left-sided pleural metastasis. 4. Slight decrease in hypermetabolism involving abdominal lymph nodes. 5. Slight improvement in osseous metastasis. Vague hypermetabolism surrounding the right ischial tuberosity is slightly increased but could be due to interval radiation therapy. Electronically Signed   By: Abigail Miyamoto M.D.   On: 02/25/2016 12:09    Impression: We went over her recent  imaging and her recent symptoms in detail. She has had some success with physical therapy in terms of some pelvic discomfort and we will continue to follow this. I believe that her PET scan through the pelvic region including the prior areas of treatment in the inferior pubic ramus looked good, really only showing some faint hypermetabolic activity consistent with previous radiation treatment to this area.  We also went over in detail the lesion at the T11 level. There is some enlargement of this area although the PET activity did decrease. She could potentially receive radiosurgery to this area, which would be necessary given her prior radiation treatment to this level. The patient is having some discomfort near this level more laterally on the right. She does also have some liver metastases which are fairly close to this area as well. She describes her pain now as a 1-2 out of 10. After discussing the pros and cons of treatment to the spine at this time versus continuing to follow this closely, she does prefer the latter. She also is seeing medical oncology tomorrow to make some further changes in systemic treatment. I believe it would be reasonable to make these changes and follow how she is doing. However, given her history of severe pain which appeared to rapidly progressed, the patient will let us know if she feels that her pain is consistently significantly increasing and in that case we will proceed with radiosurgery to the T11 level as soon as possible. On PET scan, I did not see any clear evidence of direct disease in the area of her complaint currently.  Plan: As noted above, we have elected not to proceed with radiation treatment at this time. However, she will let us know based on her symptoms whether she feels this needs to be done soon and we will proceed accordingly.   ------------------------------------------------  [name] Gross, MD, PhD  This document serves as a record of services  personally performed by Kyung Rudd, MD. It was created on his behalf by Darcus Austin, a trained medical scribe. The creation of this record is based on the scribe's personal observations and the provider's statements to them. This document has been checked and approved by the attending provider.

## 2016-02-29 ENCOUNTER — Ambulatory Visit (HOSPITAL_BASED_OUTPATIENT_CLINIC_OR_DEPARTMENT_OTHER): Payer: 59 | Admitting: Oncology

## 2016-02-29 ENCOUNTER — Ambulatory Visit: Payer: 59 | Attending: Oncology | Admitting: Physical Therapy

## 2016-02-29 DIAGNOSIS — Z86718 Personal history of other venous thrombosis and embolism: Secondary | ICD-10-CM

## 2016-02-29 DIAGNOSIS — C771 Secondary and unspecified malignant neoplasm of intrathoracic lymph nodes: Secondary | ICD-10-CM

## 2016-02-29 DIAGNOSIS — I890 Lymphedema, not elsewhere classified: Secondary | ICD-10-CM | POA: Diagnosis not present

## 2016-02-29 DIAGNOSIS — M62838 Other muscle spasm: Secondary | ICD-10-CM | POA: Insufficient documentation

## 2016-02-29 DIAGNOSIS — Z170 Estrogen receptor positive status [ER+]: Secondary | ICD-10-CM

## 2016-02-29 DIAGNOSIS — C787 Secondary malignant neoplasm of liver and intrahepatic bile duct: Secondary | ICD-10-CM | POA: Diagnosis not present

## 2016-02-29 DIAGNOSIS — G893 Neoplasm related pain (acute) (chronic): Secondary | ICD-10-CM

## 2016-02-29 DIAGNOSIS — C7951 Secondary malignant neoplasm of bone: Secondary | ICD-10-CM | POA: Insufficient documentation

## 2016-02-29 DIAGNOSIS — M79651 Pain in right thigh: Secondary | ICD-10-CM | POA: Insufficient documentation

## 2016-02-29 DIAGNOSIS — C50211 Malignant neoplasm of upper-inner quadrant of right female breast: Secondary | ICD-10-CM | POA: Diagnosis not present

## 2016-02-29 MED ORDER — PALBOCICLIB 100 MG PO CAPS
100.0000 mg | ORAL_CAPSULE | Freq: Every day | ORAL | 6 refills | Status: DC
Start: 2016-02-29 — End: 2016-05-23

## 2016-02-29 NOTE — Therapy (Signed)
[place], [place], [address] Phone: [phone]   Fax:  [phone]  Physical Therapy Treatment  Patient Details  Name: [name] [name] MRN: [id number] Date of Birth: [birth year] Referring Provider: Dr. Kyung Rudd  Encounter Date: 02/29/2016      PT End of Session - 02/29/16 1217    Visit Number 130  92 for lymph   Number of Visits 95  for lymph   Date for PT Re-Evaluation 03/03/16  for lymph   Authorization - Visit Number 140   PT Start Time 1020   PT Stop Time 1104   PT Time Calculation (min) 44 min   Activity Tolerance Patient tolerated treatment well   Behavior During Therapy [facility] for tasks assessed/performed      Past Medical History:  Diagnosis Date  . Bone metastases ([place]) dx'd 05/2014  . Breast cancer ([place]) dx'd 2005/2011  . Peripheral vascular disease ([place]) 02/2010   blood clot related to porta cath  . PONV (postoperative nausea and vomiting)   . S/P radiation therapy 07/17/2014 through 08/02/2014    Left mediastinum, left seventh rib 3250 cGy in 13 sessions   . S/P radiation therapy 12/11/2014 through 12/22/2014    Left parietal calvarium 2400 cGy in 8 sessions   . Seizures ([place]) 2010   Isolated incident.    Past Surgical History:  Procedure Laterality Date  . AXILLARY LYMPH NODE DISSECTION  Dec. 2011  . BREAST LUMPECTOMY  2005  . MEDIASTINOTOMY CHAMBERLAIN MCNEIL Left 06/02/2013   Procedure: MEDIASTINOTOMY CHAMBERLAIN MCNEIL;  Surgeon: [name], MD;  Location: [facility];  Service: Thoracic;  Laterality: Left;  LEFT ANTERIOR MEDIASTINOTOMY   . PORTACATH PLACEMENT  12/11  . removal portacath      There were no vitals filed for this visit.      Subjective Assessment - 02/29/16 1022    Subjective The pain on the  right side of my back has come back, like a year ago, at T11.  Taking an Aleve everyday.   Currently in Pain? Yes   Pain Score 6    Pain Location Axilla   Pain Orientation Right   Pain Descriptors / Indicators Other (Comment)  tightness, fullness   Pain Type Chronic pain   Pain Onset More than a month ago   Aggravating Factors  fullness, increased swelling   Pain Relieving Factors therapy   Pain Score 1   Pain Location Groin   Pain Orientation Right                         [facility] Adult PT Treatment/Exercise - 02/29/16 0001      Manual Therapy   Myofascial Release right axilla with focus on scar tightness   Manual Lymphatic Drainage (MLD) In left sidelying, posterior interaxillary anastomosis and right axillo-inguinal anastomosis; in supine, short neck, left axilla and anterior interaxillary anastomosis, right groin and axillo-inguinal anastomosis, and right upper extremity from fingers to shoulder.  In right sidelying, left periscapular area toward left groin.   Other Manual Therapy soft tissue work at superior aspect of right medial and upper thigh in prone position for pain relief and muscle relaxation, gentle myofascial release at upper thigh                  PT Short Term Goals - 02/12/16 1226      PT SHORT TERM GOAL #1   Title pain with walking decreased >/=  25%   Time 4   Period Weeks   Status Achieved           PT Long Term Goals - 02/26/16 1231      PT LONG TERM GOAL #1   Title indpendent with HEP   Time 8   Period Weeks   Status On-going     PT LONG TERM GOAL #2   Title pain with walking decreased >/= 75%   Time 8   Period Weeks   Status On-going  50%     PT LONG TERM GOAL #3   Title ability to flex her right hip with right groin pain decreased >/= 50% due to improved tissue mobility   Time 8   Period Weeks   Status On-going  25%           Long Term Clinic Goals - 02/29/16 1221      CC Long Term Goal  #2   Title  Pt. will report swelling is adequately managed to enable ADL function at a consistent level.   Status On-going     CC Long Term Goal  #4   Title Pain/discomfort at right axilla area will be controlled at 6/10 or less.   Status On-going     CC Long Term Goal  #5   Title Patient will avoid infection by ongoing management of her lymphedema at right breast/axilla/upper arm areas.   Status On-going     CC Long Term Goal  #6   Title Pain in right groin/ischial tuberosity area will be controlled at 3/10 level or less to enable walking with less pain.   Status Achieved            Plan - 02/29/16 1219    Clinical Impression Statement Ongoing induration in swelling benefits from therapy, as patient doesn't tolerate using her Flexitouch and compression bras don't reach the upper outer breast and axilla; still with mild hand swelling as well.  Right thigh pain benefits from myofascial release to decrease it.   Rehab Potential Good   Clinical Impairments Affecting Rehab Potential active cancer   PT Frequency 2x / week   PT Duration 12 weeks   PT Treatment/Interventions Manual techniques;Manual lymph drainage   PT Next Visit Plan Will need renewal for next lymphedema visit; continue manual lymph drainage and other manual techniques   Consulted and Agree with Plan of Care Patient      Patient will benefit from skilled therapeutic intervention in order to improve the following deficits and impairments:  Increased edema, Increased fascial restricitons, Pain  Visit Diagnosis: Lymphedema, not elsewhere classified  Pain in right thigh     Problem List Patient Active Problem List   Diagnosis Date Noted  . Liver metastases ([place]) 02/23/2016  . 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 Zoster 04/04/2015  . Nausea with vomiting 11/18/2014  . Constipation 11/18/2014  . Left-sided thoracic back pain   . Bone metastases ([place]) 11/16/2014  . Back pain 11/15/2014  . Uncontrolled pain  11/14/2014  . Post-lymphadenectomy lymphedema of arm 05/31/2014  . Chest wall pain 03/21/2014  . Abnormal LFTs (liver function tests) 09/12/2013  . Breast cancer of upper-inner quadrant of right female breast ([place]) 08/18/2013  . 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

## 2016-02-29 NOTE — Progress Notes (Signed)
[place]  [phone] Fax:(336) [phone]     ID: [name] OB: 02-23-1961  MR#: [id number]  DHR#:[id number]  PCP: Pcp Not In System GYN:  [name] SU:  OTHER MD: [name], [name], [name], [name], [name], Kyung Rudd   CHIEF COMPLAINT: Stage IV breast cancer  CURRENT TREATMENT: Fulvestrant, denosumab, palbociclib  INTERVAL HISTORY:   Rhiann returns today for follow-up of her estrogen receptor positive breast cancer accompanied by her husband [name]. [name] then met with Dr. Lisbeth Renshaw yesterday to discuss whether palliative radiation specifically to the to 11 area might relieve the pain that she intermittently has in the right posterior chest wall. They reviewed the PET scan and MRIs carefully and they decided that if her pain becomes more intense or more persistent or both they will consider stereotactic radiosurgery. She understands that this would require a repeat MRI and at least a week and given the holidays coming up she will make a definitive decision in the next few days whether this is going to be an option for her.  She is here today to discuss changes in systemic management. She received her most recent fulvestrant and denosumab/Xgeva treatment 02/13/2016. These are repeated every 28 days. She started her most recent Ibrance cycle on 02/24/2016 and continues at 75 mg daily 21 days on 7 days off  REVIEW OF SYiSTEMS: Aariel's pain or soreness localizes to the right lower scapular area. It does not change in position when she lifts her arm. The pain improved with physical therapy and she requested a repeat order for physical therapy so she can continue to benefit from this form of management. She was also concerned about some routine labs in my chart that may be running out. She is tolerating the fulvestrant well. She is managing her chronic problems with diarrhea/constipation by using Colace,  magnesium, and warm apple juice. This is helping. A detailed review of systems today was otherwise stable  BREAST CANCER HISTORY: From doctor [name]'s intake note 03/20/2004:  "The patient is a very pleasant 55 year old female, without significant past medical history.  Her family history is significant for a sister who at age 22 was diagnosed with invasive ductal carcinoma.  She is a breast cancer survivor at age 61 now.  The patient states that she has never really had a screening mammogram until October 2005, when she felt that it was time for her to start having mammograms done on a yearly basis.  Therefore, on 01/26/04, she underwent a screening mammogram and an abnormality was detected in the upper outer right breast.  She, therefore, underwent spot compression views of both the right and the left breast.  The left breast revealed a well-defined mass in the upper outer left quadrant, present at the 2 o'clock position, measuring 1.8 cm, 6 cm from the nipple.  This, by ultrasound, was felt to be a simple cyst measuring 1.8 cm.  On the right breast, a spiculated mass was noted in the upper outer right quadrant.  The ultrasound revealed a shadowing irregular solid mass at the 10:30 position, 9 cm from the nipple, measuring 1.2 cm in greatest dimension, correlating with the spiculated mass seen on the mammogram.  The right axilla was negative ultrasonically.  Because of this, the patient underwent a needle biopsy of the right breast and the biopsy was positive invasive mammary carcinoma that showed features consistent with a high-grade invasive ductal carcinoma associated with desmoplastic stroma.  No in situ component was seen and no definite lymphovascular invasion was identified.  On the core biopsy, the tumor measured about 0.8 cm.  Because of this, she was seen by Dr. [name] and the patient was taken to the [facility] on March 15, 2004.  She underwent a right breast lumpectomy with sentinel  node biopsy.  The final pathology revealed an invasive ductal carcinoma, measuring 1.7 cm, grade 2 of 3.  Margins were free of tumor.  Atypical lobular hyperplasia was noted.  One sentinel node was removed which was negative for metastatic disease.  The tumor was staged at T1c, N0 MX.  It was estrogen receptor positive, progesterone receptor positive.  HER-2/neu was 2+.  FISH was negative.  All margins were free of tumor.  She is now seen in Medical Oncology for further evaluation and management of this newly diagnosed T1c, node negative, stage I, invasive ductal carcinoma of the right breast."  Her subsequent history is as detailed below   PAST MEDICAL HISTORY: Past Medical History:  Diagnosis Date  . Bone metastases ([place]) dx'd 05/2014  . Breast cancer ([place]) dx'd 2005/2011  . Peripheral vascular disease ([place]) 02/2010   blood clot related to porta cath  . PONV (postoperative nausea and vomiting)   . S/P radiation therapy 07/17/2014 through 08/02/2014    Left mediastinum, left seventh rib 3250 cGy in 13 sessions   . S/P radiation therapy 12/11/2014 through 12/22/2014    Left parietal calvarium 2400 cGy in 8 sessions   . Seizures ([place]) 2010   Isolated incident.    PAST SURGICAL HISTORY: Past Surgical History:  Procedure Laterality Date  . AXILLARY LYMPH NODE DISSECTION  Dec. 2011  . BREAST LUMPECTOMY  2005  . MEDIASTINOTOMY CHAMBERLAIN MCNEIL Left 06/02/2013   Procedure: MEDIASTINOTOMY CHAMBERLAIN MCNEIL;  Surgeon: [name], MD;  Location: [facility];  Service: Thoracic;  Laterality: Left;  LEFT ANTERIOR MEDIASTINOTOMY   . PORTACATH PLACEMENT  12/11  . removal portacath      FAMILY HISTORY Family History  Problem Relation Age of Onset  . COPD Mother   . Breast cancer Sister 48   The patient's father is living, 66 years old as  of may 2015. He lives in [place]. The patient's mother died from complications of COPD at the age of 67. These has 2 brothers, one sister. Her sister developed breast cancer at the age of 54. She is doing well. The patient herself underwent genetic testing at [facility] in 2011 and was found to be BRCA negative  GYNECOLOGIC HISTORY:  Menarche age 65, she is GX P0. She stopped having periods with her initial chemotherapy in 2006.  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 Her husband [name] is a Medical illustrator in [place]. He has a child from a prior marriage. At home they have 2 rescue dogs, Hobo and Camden. The patient is religious but not a church attender    ADVANCED DIRECTIVES: In place; at the 08/04/2014 visit in particular the patient was very clear, with her husband present, that she would not want any kind of feeding tubes or "other tubes" if her condition deteriorated.   HEALTH MAINTENANCE: Social History  Substance Use Topics  . Smoking status: Never Smoker  . Smokeless tobacco: Never Used  . Alcohol use No     Colonoscopy:  PAP:  Bone density: March 2015; mild osteopenia  Lipid panel:  Allergies  Allergen Reactions  . 2nd Skin Quick Heal Other (See Comments)    Other Reaction: Skin peels  . Decadron [Dexamethasone] Other (See Comments)    Patient does not tolerate steroids.   . Dilaudid [Hydromorphone] Nausea And Vomiting  . Enoxaparin Other (See Comments)    unknown  . Fluconazole Swelling    Liver toxicity  . Hydromorphone Hcl Nausea And Vomiting  . Morphine And Related Nausea And Vomiting  . Protonix [Pantoprazole Sodium] Other (See Comments)    Patient reports it caused thrush.  . Tegaderm Ag Mesh [Silver]     Current Outpatient Prescriptions  Medication Sig Dispense Refill  . ALPRAZolam (XANAX) 0.5 MG tablet Take 1 tablet (0.5 mg total) by mouth 2 (two) times daily as needed for anxiety.  30 tablet 0  . B Complex-C (B-COMPLEX WITH VITAMIN C) tablet Take 1 tablet by mouth daily. Reported on 03/27/2015    . calcium carbonate (TUMS - DOSED IN MG ELEMENTAL CALCIUM) 500 MG chewable tablet Chew 1 tablet by mouth as directed.    . cholecalciferol 2000 UNITS tablet Take 1 tablet (2,000 Units total) by mouth daily.    . ciprofloxacin (CIPRO) 500 MG tablet Take 1 tablet (500 mg total) by mouth 2 (two) times daily. 6 tablet 0  . Diphenhyd-Hydrocort-Nystatin (FIRST-DUKES MOUTHWASH) SUSP 5-10 ml qid SWISH AND SPIT 701 mL 3  . folic acid (FOLVITE) 1 MG tablet Take 1 tablet (1 mg total) by mouth daily.    Marland Kitchen loratadine-pseudoephedrine (CLARITIN-D 24-HOUR) 10-240 MG 24 hr tablet Take 1 tablet by mouth daily.    . Melatonin 3 MG TABS Take 3 mg by mouth at bedtime.    . naproxen sodium (ANAPROX) 220 MG tablet Take 220 mg by mouth 2 (two) times daily with a meal.    . palbociclib (IBRANCE) 100 MG capsule Take 1 capsule (100 mg total) by mouth daily with breakfast. Take whole with food. 21 capsule 6  . phenazopyridine (PYRIDIUM) 200 MG tablet Take 1 tablet (200 mg total) by mouth 3 (three) times daily as needed for pain. 30 tablet 1  . RABEprazole Sodium 5 MG CPSP Take 5 mg by mouth daily. 30 capsule 3  . saccharomyces boulardii (FLORASTOR) 250 MG capsule Take 250 mg by mouth daily.      No current facility-administered medications for this visit.     OBJECTIVE: Middle-aged white womanIn no acute distress  There were no vitals filed for this visit.   There is no height or weight on file to calculate BMI.   There were no vitals filed for this visit.   Patient refused vitals today 02/29/2016    ECOG FS: 1  Sclerae unicteric, pupils round and equal Oropharynx clear and moist-- no thrush or other lesions No cervical or supraclavicular adenopathy Lungs no rales or rhonchi Heart regular rate and rhythm Abd soft, nontender, positive bowel sounds MSK no focal spinal tenderness, focal tenderness in  the lower right scapular area slightly laterally, without associated erythema Neuro: nonfocal, well oriented, appropriate affect Breasts: Deferred  LAB RESULTS:   CMP     Component Value Date/Time   NA 137 02/11/2016 1152   K 3.9 02/11/2016 1152   CL 103 12/14/2014 0800   CL 105 05/06/2012 1333   CO2 26 02/11/2016 1152   GLUCOSE 85 02/11/2016 1152   GLUCOSE 124 (H) 05/06/2012 1333   BUN 10.2 02/11/2016 1152   CREATININE 0.8 02/11/2016 1152   CALCIUM 10.1 02/11/2016 1152   PROT 7.5 02/11/2016  1152   ALBUMIN 3.8 02/11/2016 1152   AST 28 02/11/2016 1152   ALT 29 02/11/2016 1152   ALKPHOS 74 02/11/2016 1152   BILITOT 0.38 02/11/2016 1152   GFRNONAA >60 12/14/2014 0800   GFRAA >60 12/14/2014 0800    No results found for: SPEP  Lab Results  Component Value Date   WBC 3.0 (L) 02/25/2016   NEUTROABS 1.7 02/25/2016   HGB 11.9 02/25/2016   HCT 33.6 (L) 02/25/2016   MCV 104.7 (H) 02/25/2016   PLT 251 02/25/2016      Chemistry      Component Value Date/Time   NA 137 02/11/2016 1152   K 3.9 02/11/2016 1152   CL 103 12/14/2014 0800   CL 105 05/06/2012 1333   CO2 26 02/11/2016 1152   BUN 10.2 02/11/2016 1152   CREATININE 0.8 02/11/2016 1152      Component Value Date/Time   CALCIUM 10.1 02/11/2016 1152   ALKPHOS 74 02/11/2016 1152   AST 28 02/11/2016 1152   ALT 29 02/11/2016 1152   BILITOT 0.38 02/11/2016 1152     No results for input(s): INR in the last 168 hours.  Urinalysis    Component Value Date/Time   COLORURINE YELLOW 11/17/2014 0143   APPEARANCEUR CLOUDY (A) 11/17/2014 0143   LABSPEC 1.005 01/02/2016 1127   PHURINE 7.0 01/02/2016 1127   PHURINE 6.5 11/17/2014 0143   GLUCOSEU Negative 01/02/2016 1127   HGBUR Trace 01/02/2016 1127   HGBUR NEGATIVE 11/17/2014 0143   BILIRUBINUR Negative 01/02/2016 1127   KETONESUR Negative 01/02/2016 1127   KETONESUR NEGATIVE 11/17/2014 0143   PROTEINUR Negative 01/02/2016 1127   PROTEINUR NEGATIVE 11/17/2014 0143    UROBILINOGEN 0.2 01/02/2016 1127   NITRITE Negative 01/02/2016 1127   NITRITE NEGATIVE 11/17/2014 0143   LEUKOCYTESUR Trace 01/02/2016 1127     Ref. Range 11/19/2015 11:46 12/17/2015 11:30 01/14/2016 11:47 01/30/2016 11:55 02/11/2016 11:52  CA 27.29 Latest Ref Range: 0.0 - 38.6 U/mL 1,572.2 (H) 1,754.1 (H) 2,432.2 (H) 2,042.4 (H) 2,463.9 (H)    STUDIES: Mr Thoracic Spine W Wo Contrast  Addendum Date: 02/15/2016   ADDENDUM REPORT: 02/15/2016 13:11 ADDENDUM: The left posterior tenth rib bone lesion is unchanged compared with PET-CT dated 09/05/2015 accounting for differences in modality. The T2 vertebral body bone lesion is unchanged compared with PET-CT dated 09/05/2015 accounting for differences in modality. The T11 vertebral body bone lesion is enlarged compared with MR abdomen 11/23/2015 consistent with disease progression. Electronically Signed   By: Kathreen Devoid   On: 02/15/2016 13:11   Result Date: 02/15/2016 CLINICAL DATA:  Breast cancer.  Evaluate for bony metastasis. EXAM: MRI THORACIC SPINE WITHOUT AND WITH CONTRAST TECHNIQUE: Multiplanar and multiecho pulse sequences of the thoracic spine were obtained without and with intravenous contrast. CONTRAST:  58m MULTIHANCE GADOBENATE DIMEGLUMINE 529 MG/ML IV SOLN COMPARISON:  None. FINDINGS: Alignment:  Physiologic. Vertebrae: No fracture or discitis. 10 mm T1 hypo intense enhancing T2 vertebral body bone lesion. 12 x 18 mm T1 hypointense enhancing T11 vertebral body bone lesion posteriorly extending into the right pedicle. T1 hypo intense enhancing bone lesion in the left posterior tenth rib. Cord:  Normal signal and morphology. Paraspinal and other soft tissues: No paraspinal soft tissue abnormality. Bandlike area of airspace disease in the left upper lobe consistent with radiation fibrosis. Left lower lobe pleural based 4.1 x 2.2 cm soft tissue mass better evaluated on recent PET-CT dated 11/26/2015. Multiple hepatic masses concerning for  metastatic disease. Cholelithiasis. Disc levels: Disc spaces:  Disc spaces are maintained. T1-T2: No disc protrusion, foraminal stenosis or central canal stenosis. T2-T3: No disc protrusion, foraminal stenosis or central canal stenosis. T3-T4: No disc protrusion, foraminal stenosis or central canal stenosis. T4-T5: No disc protrusion, foraminal stenosis or central canal stenosis. T5-T6: Small right paracentral disc protrusion. No foraminal or central canal stenosis. T6-T7: No disc protrusion, foraminal stenosis or central canal stenosis. T7-T8: No disc protrusion, foraminal stenosis or central canal stenosis. T8-T9: No disc protrusion, foraminal stenosis or central canal stenosis. T9-T10: Shallow left paracentral disc protrusion. No foraminal or central canal stenosis. T10-T11: No disc protrusion, foraminal stenosis or central canal stenosis. T11-T12: Mild broad-based disc bulge with a shallow left paracentral disc protrusion. No foraminal or central canal stenosis. IMPRESSION: 1. T2 and T11 vertebral body bone lesions most consistent with metastatic disease given the patient's history of breast cancer. 2. Left posterior tenth rib bone lesion most consistent with metastatic disease. 3. Mild thoracic spine spondylosis as described above. 4. Multiple hepatic masses concerning for metastatic disease. These are better evaluated on abdominal MRI performed same day. Electronically Signed: By: Kathreen Devoid On: 02/15/2016 08:26   Mr Abdomen Wwo Contrast  Result Date: 02/15/2016 CLINICAL DATA:  History of breast cancer with mediastinal nodal, left pleural, hepatic, and abdominal nodal metastasis. EXAM: MRI ABDOMEN WITHOUT AND WITH CONTRAST TECHNIQUE: Multiplanar multisequence MR imaging of the abdomen was performed both before and after the administration of intravenous contrast. CONTRAST:  5 cc of MultiHance COMPARISON:  Abdominal MRI of 11/23/2015. FINDINGS: Portions of exam are mildly motion degraded. Lower chest:  Similar appearance of left-sided pleural fluid and nodularity. Left pleural implant measures 1.5 cm on image 9/ series 12001 and is not significantly changed. The area of pleural thickening likely extends into the left neural foramina at approximately the T9-10 level on image 13/series 3. This is unchanged. Normal heart size without pericardial or pleural effusion. Hepatobiliary: Hepatic metastasis are best evaluated on series 12001. Index central right hepatic lobe lesion measures 2.7 x 2.2 cm on image 42/ series 12001. Compare 2.5 x 2.2 cm on the prior exam. More lateral right hepatic lobe mass measures 4.5 x 3.8 cm on image 46/series 12001. Compare 3.9 x 2.7 cm previously. A right hepatic lobe pericholecystic lesion measures 1.8 x 1.6 cm on image 55/series 12001. Compare 1.7 x 1.3 cm on the prior exam (when remeasured). Inferior right hepatic lobe 12 mm lesion on image 76/ series 12001 measured 6 mm on the prior exam (when remeasured). Again identified is a gallstone without acute cholecystitis or biliary duct dilatation. Pancreas:  Normal, without mass or ductal dilatation. Spleen:  Normal in size, without focal abnormality. Adrenals/Urinary Tract: Normal adrenal glands. Normal kidneys, without hydronephrosis. Stomach/Bowel: Normal stomach and abdominal bowel loops. Vascular/Lymphatic: Normal caliber of the aorta and branch vessels. Portal caval node measures 7 mm on image 22/series 3 and is decreased from 10 mm on the prior exam.Left periaortic retroperitoneal adenopathy measures 11 x 9 mm on image 48/ series 12001. Compare 12 x 12 mm on the prior exam. Other:  No ascites.  No evidence of omental or peritoneal disease. Musculoskeletal: Osseous metastasis. A right-sided T11 lesion measures 1.9 cm on image 18/series 3 versus 1.3 cm on the prior exam.Right-sided lamina lesion at L1 measures 11 mm on image 31/series 3 versus 10 mm on the prior.Inferior L3 lesion measures 1.7 x 3.3 cm on image 38/series 13 and is  similar to the prior exam (when remeasured). IMPRESSION: 1. Mild progression of hepatic  metastasis. 2. Similar to slight improvement in abdominal adenopathy. 3. Similar but incompletely imaged left pleural metastasis. 4. Primarily similar osseous metastasis. A right-sided T11 lesion appears enlarged since the prior exam. 5. Minimal motion degradation. 6. Cholelithiasis. Electronically Signed   By: Abigail Miyamoto M.D.   On: 02/15/2016 12:34   Nm Pet Image Restag (ps) Skull Base To Thigh  Result Date: 02/25/2016 CLINICAL DATA:  Subsequent treatment strategy for right-sided breast cancer with metastasis to bones, left pleural space, liver, and nodal stations. EXAM: NUCLEAR MEDICINE PET SKULL BASE TO THIGH TECHNIQUE: 8.0 mCi F-18 FDG was injected intravenously. Full-ring PET imaging was performed from the skull base to thigh after the radiotracer. CT data was obtained and used for attenuation correction and anatomic localization. FASTING BLOOD GLUCOSE:  Value: 88 Mg/dl COMPARISON:  PET of 11/26/2015.  Abdominal MRI of 02/14/2016. FINDINGS: NECK No areas of abnormal hypermetabolism. No cervical adenopathy. Lucent lesions within the calvarium are again identified and not hypermetabolic. A left parietal lesion measures 1.8 cm on image 9/series 4 versus 1.9 cm on the prior. CHEST Prevascular node measures 11 mm and a S.U.V. max of 10.9 on image 82/ series 4. Compare 9 mm and a S.U.V. max of 5.2 on the prior exam. Left subpectoral node measures 6 mm and a S.U.V. max of 2.0 on image 82/ series 4. Compare 7 mm and a S.U.V. max of 2.4 on the prior exam. Left internal mammary node measures 8 mm and a S.U.V. max of 5.9 on image 88/ series 4. Compare 6 mm and a S.U.V. max of 4.7 on the prior. Left-sided mediastinal hypermetabolism corresponding to probable nodal tissue. This measures 8 mm and a S.U.V. max of 6.5 on image 83/series 4. Compare similar in size and a S.U.V. max of 4.4 on the prior. A focus of hypermetabolic left  pleural nodularity measures 12 mm and a S.U.V. max of 8.3 on image 105/series 4. Compare similar in size and a S.U.V. max of 6.3 on the prior. Similar appearance of radiation fibrosis in the left apex and left upper lobe. Volume loss with rounded atelectasis in the left lower lobe. Right axillary node dissection. ABDOMEN/PELVIS Hepatic metastasis. Index lesion in the anterior right hepatic lobe measures 4.7 cm and a S.U.V. max of 13.1 on image 118/series 4. On the prior PET, 4.1 cm and a S.U.V. max of 7.4. Similar in size to on the prior MRI, given cross modality comparison. Central right hepatic lobe lesion measures 2.4 cm and a S.U.V. max of 7.5 on image 118/series 4. Compare 1.9 cm and a S.U.V. max of 5.2 on the prior. Aortocaval node measures 8 mm and a S.U.V. max of 6.3 versus 9 mm and a S.U.V. max of 5.5. Left periaortic node measures 9 mm and a S.U.V. max of 3.5 on image 130/series 4. Compare 1.0 cm and a S.U.V. max of 4.5 on the prior. Abdominal aortic atherosclerosis. Mild pelvic floor laxity. No bowel obstruction. SKELETON A right-sided T11 lesion measures a S.U.V. max of 6.2 today versus a S.U.V. max of 11.5 on the prior exam. Vague hypermetabolism surrounding the right inferior pubic ramus measures a S.U.V. max of 4.3 today versus a S.U.V. max of 3.4 on the prior. IMPRESSION: 1. Since the prior PET of 11/26/2015, progression of nodal metastasis within the chest. 2. Mild progression of hepatic metastasis. 3. Similar to slight progression of left-sided pleural metastasis. 4. Slight decrease in hypermetabolism involving abdominal lymph nodes. 5. Slight improvement in osseous metastasis. Vague hypermetabolism  surrounding the right ischial tuberosity is slightly increased but could be due to interval radiation therapy. Electronically Signed   By: Abigail Miyamoto M.D.   On: 02/25/2016 12:09    ASSESSMENT: 55 y.o. BRCA negative [place] woman with stage IV breast cancer, history as follows  (1)  S/p Right  upper inner quadrant lumpectomy and sentinel lymph node sampling 03/15/2004 for a pT1c pN0. Stage IA invasive ductal carcinoma, grade 2, estrogen receptor 95% positive, progesterone receptor 65% positive, HER-2 not amplified; additional surgery 04/25/2004 for seroma or clearance showed no residual tumor  (2) adjuvant chemotherapy with cyclophosphamide and doxorubicin every 21 days x4 completed 07/19/2004  (3) adjuvant radiation given under Dr. Donella Stade in [place] completed July 2006  (4) the patient opted against adjuvant antiestrogen therapy  (5) genetics testing showed no BRCA mutations  (6) biopsy of a palpable right axillary mass 10/24/2009 showed invasive ductal carcinoma, grade 3, estrogen receptor 100% positive, progesterone receptor 2% positive (alert score 5) HER-2 negative; no evidence of systemic disease on PET scanning  (7) completed 3 of 4 planned cycles of docetaxel and cyclophosphamide September 2011, fourth cycle omitted because of marked elevations in liver function tests  (8) an right axillary lymph node dissection 03/06/2010 showed 3/8 lymph nodes removed to be involved by tumor, with extracapsular extension.  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, TTS-1 negative), estrogen receptor 80% positive, progesterone receptor 2% positive, HER-2 not amplified  (12) letrozole started March 2015-- tolerated with significant side effects, discontinued at the end of May 2015  (13) PET scan 08/16/2013 shows extensive left pleural metastatic disease and a large left pleural effusion that  shifts cardiac and mediastinal structures to the right; adenopathy (celiac trunk, periadrenal, periaortic); and a left medial clavicular lesion; Status post left thoracentesis 08/16/2013 positive for adenocarcinoma, estrogen receptor positive, progesterone receptor negative.  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; everolimus was suggested as an available targeted agent  (18) exemestane started 03/31/2014, discontinued 10/31/2014 with evidence of progression  (a) everolimus added 04/03/2014 but not tolerated (cytopenias, elevated LFTs) even at minimal doses; stopped 04/17/2014  (19) fulvestrant started 12/20/2014  (a) palbociclib added at very low dose 04/03/2015 (starting dose 75 mg weekly)  (b) palbociclib dose gradually increased to 75 mg daily, 21/7, as of May 2017  (20) liver biopsy 03/20/2015 confirms metastatic carcinoma, still estrogen receptor positive at 100%, progesterone receptor negative, HER-2 equivocal with a signals ratio 1.41, number per cell 4.50.   (a) repeat liver biopsy December 2017 might show further changes (HER-2 positivity)  (21) immunohistochemistry for mismatched repair protein mutations 03/20/2015 showed normal major and minor MMR proteins, with a very low probability of microsatellite instability ([id number])  (22) adjuvant radiation 12/24/15-01/02/16 Site/dose:   1) Left T9 Rib / 24 Gy  in 8 fx                         2) Right inferior pelvis/ 24 Gy in 8 fx  (23) consider pembrolizumab starting  02/29/2016 (obtained compassionate release from company)    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 treated with Lovenox for 5-6 months  (c) right upper extremity lymphedema--receiving physical therapy  (d) hepatic steatosis with chronically elevated LFTs as well as unusual hepatic sensitivity to chemotherapy  (e) osteopenia with the lowest T score -1.6 on bone density scan 06/20/2013  (i) on denosumab/ Xgeva Q28d  (f) radiation oncology (Dr Valere Dross) has reviewed prior radiation records in case there is further mediastinal involvement with dysphagia etc in which case palliative XRT could be considered  (a) radiation to left mediastinum/ left 7th rib 3250 cGy in 13 sessions04/18/2016 through 08/02/2014  (b) radiation to T11 area: 22 Gy in 7 sessions, last dose 11/27/2014  (c) radiation left parietal scalp region to be completed 12/22/2014  (d) radiation to sacral area completed 04/09/2015  (e) radiation to right inferior pelvis and left ninth rib (24 gray, 12/24/2015--01/02/2016)  (g) chest wall and perineal pain--improved post radiation treatments  (a) discussed celebrex/ carafate but holding off for now  (h) zoster diagnosed 04/04/2015-- on valacyclovir   PLAN: I spent approximately 40 minutes today with [name] and [name] again reviewing her options. As far as radiation therapy is concerned she is going to decide within the next week whether or not she wants to try stereotactic radiotherapy but at this point she is leaning against it since her pain is not constant or progressive and she has not had to take more than 2 Aleve on the worst days (some days she takes no Aleve at all). She will make that decision definitively within within the next week since otherwise Dr. Lisbeth Renshaw will be out of town until January.  We again reviewed the options for systemic therapy and  she is very encouraged that for the past 5 or 6 months they have been no new bone lesions. There has been measurable but very minimal growth in the liver lesions. She understands we need to intensify or change therapy and the options include adding letrozole, increasing the palbociclib dose, or switching to pembrolizumab [place] which has been approved for her.  At the last visit she thought she would want to only add one 100 mg palbociclib tablet in the last week of each 3 weeks of the cycle, but upon further thought she feels what her cancer needs is a "shock" and she wants to go straight to 100 mg daily. This is I think the correct decision. My suspicion is that she will have about the same side effects as she has been having on the lower dose and she may obtain what she wishes which his disease control.  Accordingly I have entered the order for her to receive 100 mg tablets now. Because she is in the middle of the cycle I want her provider to understand that we are changing the dose now not at the beginning of the next cycle. I expect we will be able to start this new dose as early as November 5. Of course we are going to continue to follow her lab work very closely as we have been doing  Am encouraged that this may achieve disease  control. We will have to go through at least 2 full cycles at the new dose before being able to determine that. Of course we are going to continue to check monthly CA-27-29's, which may give [place] an early clue  Jenaya knows to call for any problems that may develop before her next visit here.   Chauncey Cruel, MD   03/01/2016 12:00 PM

## 2016-03-03 ENCOUNTER — Ambulatory Visit
Admission: RE | Admit: 2016-03-03 | Discharge: 2016-03-03 | Disposition: A | Discharge status: Home / Self Care | Payer: 59 | Source: Ambulatory Visit | Attending: Radiation Oncology | Admitting: Radiation Oncology

## 2016-03-03 ENCOUNTER — Ambulatory Visit (HOSPITAL_BASED_OUTPATIENT_CLINIC_OR_DEPARTMENT_OTHER): Payer: 59 | Admitting: Oncology

## 2016-03-03 ENCOUNTER — Other Ambulatory Visit: Payer: Self-pay | Admitting: Radiation Therapy

## 2016-03-03 ENCOUNTER — Other Ambulatory Visit (HOSPITAL_BASED_OUTPATIENT_CLINIC_OR_DEPARTMENT_OTHER): Payer: 59

## 2016-03-03 ENCOUNTER — Ambulatory Visit: Payer: 59 | Admitting: Physical Therapy

## 2016-03-03 DIAGNOSIS — C771 Secondary and unspecified malignant neoplasm of intrathoracic lymph nodes: Secondary | ICD-10-CM

## 2016-03-03 DIAGNOSIS — M79651 Pain in right thigh: Secondary | ICD-10-CM

## 2016-03-03 DIAGNOSIS — J90 Pleural effusion, not elsewhere classified: Secondary | ICD-10-CM

## 2016-03-03 DIAGNOSIS — I890 Lymphedema, not elsewhere classified: Secondary | ICD-10-CM | POA: Diagnosis not present

## 2016-03-03 DIAGNOSIS — M62838 Other muscle spasm: Secondary | ICD-10-CM

## 2016-03-03 DIAGNOSIS — C7951 Secondary malignant neoplasm of bone: Secondary | ICD-10-CM

## 2016-03-03 DIAGNOSIS — C50211 Malignant neoplasm of upper-inner quadrant of right female breast: Secondary | ICD-10-CM | POA: Diagnosis not present

## 2016-03-03 DIAGNOSIS — Z170 Estrogen receptor positive status [ER+]: Principal | ICD-10-CM

## 2016-03-03 DIAGNOSIS — Z853 Personal history of malignant neoplasm of breast: Secondary | ICD-10-CM | POA: Insufficient documentation

## 2016-03-03 DIAGNOSIS — Z510 Encounter for antineoplastic radiation therapy: Secondary | ICD-10-CM | POA: Insufficient documentation

## 2016-03-03 DIAGNOSIS — G893 Neoplasm related pain (acute) (chronic): Secondary | ICD-10-CM | POA: Diagnosis not present

## 2016-03-03 DIAGNOSIS — C7952 Secondary malignant neoplasm of bone marrow: Principal | ICD-10-CM

## 2016-03-03 LAB — CBC WITH DIFFERENTIAL/PLATELET
BASO%: 2.5 % — ABNORMAL HIGH (ref 0.0–2.0)
Basophils Absolute: 0.1 10e3/uL (ref 0.0–0.1)
EOS%: 1.8 % (ref 0.0–7.0)
Eosinophils Absolute: 0.1 10e3/uL (ref 0.0–0.5)
HCT: 34.1 % — ABNORMAL LOW (ref 34.8–46.6)
HGB: 12.2 g/dL (ref 11.6–15.9)
LYMPH%: 18 % (ref 14.0–49.7)
MCH: 37 pg — ABNORMAL HIGH (ref 25.1–34.0)
MCHC: 35.8 g/dL (ref 31.5–36.0)
MCV: 103.3 fL — ABNORMAL HIGH (ref 79.5–101.0)
MONO#: 0.2 10e3/uL (ref 0.1–0.9)
MONO%: 8.6 % (ref 0.0–14.0)
NEUT#: 1.9 10e3/uL (ref 1.5–6.5)
NEUT%: 69.1 % (ref 38.4–76.8)
Platelets: 335 10e3/uL (ref 145–400)
RBC: 3.3 10e6/uL — ABNORMAL LOW (ref 3.70–5.45)
RDW: 12.9 % (ref 11.2–14.5)
WBC: 2.8 10e3/uL — ABNORMAL LOW (ref 3.9–10.3)
lymph#: 0.5 10e3/uL — ABNORMAL LOW (ref 0.9–3.3)
nRBC: 0 % (ref 0–0)

## 2016-03-03 MED ORDER — ALPRAZOLAM 0.5 MG PO TABS: 0.5000 mg | ORAL_TABLET | Freq: Two times a day (BID) | ORAL | 0 refills | Status: DC | PRN

## 2016-03-03 NOTE — Progress Notes (Signed)
Taijah came today accompanied by [name] for an unscheduled visit. We spent approximately 20 minutes discussing her current situation  She had more pain last night, which woke her up and she had to sleep on the floor for a while. Even though that sounds terrible she says the pain really was more like a 4 out of 10 when she has now is a pressure in the back and worse this was previously in the lower right scapular area it is now more central.  This brought her to Dr. Lisbeth Renshaw today and after much discussion they have decided to proceed with radiation. This has been scheduled for December 13, which is the same day she was supposed to have her Delton See and last Lotemax.  In addition, she received the 100 mg palbociclib dose from her pharmacy and started that on 03/02/2016. This means that this cycle she received 75 mg daily for the first week but will receive 100 mg for the second and third weeks.  We had a lengthy discussion regarding how all this mellitus together and basically what we have done as we are moving her Faslodex and Xgeva from the 13th to the 11th so that she doesn't have 3 different things happening on the same day. I'm going to see her the day after her radiation treatment just to make sure that she has no unusual symptoms. Of course we are going to continue to check her lab work on a weekly basis particularly now that she is on a higher dose of palbociclib.  I refilled her alprazolam today and we discussed the possible side effects toxicities and complications of Medrol Dosepak which she will start after her radiation treatment.

## 2016-03-03 NOTE — Therapy (Signed)
[place], [place], [address] Phone: [phone]   Fax:  [phone]  Physical Therapy Treatment  Patient Details  Name: [name] [name] MRN: [id number] Date of Birth: [birth year] Referring Provider: Dr. Kyung Rudd  Encounter Date: 03/03/2016      PT End of Session - 03/03/16 1630    Visit Number [id number]  92 for lymph   Number of Visits 999  for lymph   Date for PT Re-Evaluation 03/30/16   Authorization - Visit Number 140   PT Start Time 1020   PT Stop Time 1101   PT Time Calculation (min) 41 min   Activity Tolerance Patient tolerated treatment well   Behavior During Therapy [facility] for tasks assessed/performed      Past Medical History:  Diagnosis Date  . Bone metastases ([place]) dx'd 05/2014  . Breast cancer ([place]) dx'd 2005/2011  . Peripheral vascular disease ([place]) 02/2010   blood clot related to porta cath  . PONV (postoperative nausea and vomiting)   . S/P radiation therapy 07/17/2014 through 08/02/2014    Left mediastinum, left seventh rib 3250 cGy in 13 sessions   . S/P radiation therapy 12/11/2014 through 12/22/2014    Left parietal calvarium 2400 cGy in 8 sessions   . Seizures ([place]) 2010   Isolated incident.    Past Surgical History:  Procedure Laterality Date  . AXILLARY LYMPH NODE DISSECTION  Dec. 2011  . BREAST LUMPECTOMY  2005  . MEDIASTINOTOMY CHAMBERLAIN MCNEIL Left 06/02/2013   Procedure: MEDIASTINOTOMY CHAMBERLAIN MCNEIL;  Surgeon: [name], MD;  Location: [facility];  Service: Thoracic;  Laterality: Left;  LEFT ANTERIOR MEDIASTINOTOMY   . PORTACATH PLACEMENT  12/11  . removal portacath      There were no vitals filed for this visit.      Subjective Assessment - 03/03/16 1021    Subjective (P)  Having pain in middle  right back.  It woke me up in the middle of the night.  Meeting with radiation oncologist and neurosurgeon about possible radio surgery.   Currently in Pain? (P)  Yes   Pain Score (P)  3    Pain Location (P)  Back   Pain Orientation (P)  Mid;Right   Pain Score (P)  6   Pain Location (P)  Axilla   Pain Orientation (P)  Right   Pain Descriptors / Indicators (P)  Other (Comment)  fullness                         [facility] Adult PT Treatment/Exercise - 03/03/16 0001      Manual Therapy   Myofascial Release right axilla with focus on scar tightness   Manual Lymphatic Drainage (MLD) In left sidelying, posterior interaxillary anastomosis and right axillo-inguinal anastomosis; in supine, short neck, left axilla and anterior interaxillary anastomosis, right groin and axillo-inguinal anastomosis, and right upper extremity from fingers to shoulder.  In right sidelying, left periscapular area toward left groin.   Other Manual Therapy soft tissue work at superior aspect of right medial and upper thigh in prone position for pain relief and muscle relaxation, gentle myofascial release at upper thigh                  PT Short Term Goals - 02/12/16 1226      PT SHORT TERM GOAL #1   Title pain with walking decreased >/= 25%   Time 4   Period Weeks  Status Achieved           PT Long Term Goals - 02/26/16 1231      PT LONG TERM GOAL #1   Title indpendent with HEP   Time 8   Period Weeks   Status On-going     PT LONG TERM GOAL #2   Title pain with walking decreased >/= 75%   Time 8   Period Weeks   Status On-going  50%     PT LONG TERM GOAL #3   Title ability to flex her right hip with right groin pain decreased >/= 50% due to improved tissue mobility   Time 8   Period Weeks   Status On-going  25%           Long Term Clinic Goals - 03/03/16 1635      CC Long Term Goal  #2   Title Pt. will report swelling is adequately managed to enable ADL function  at a consistent level.   Status On-going     CC Long Term Goal  #4   Title Pain/discomfort at right axilla area will be controlled at 6/10 or less.   Baseline She has maintained this level for some weeks now.   Status On-going     CC Long Term Goal  #5   Title Patient will avoid infection by ongoing management of her lymphedema at right breast/axilla/upper arm areas.   Status On-going     CC Long Term Goal  #6   Title Pain in right groin/ischial tuberosity area will be controlled at 3/10 level or less to enable walking with less pain.   Status On-going            Plan - 03/03/16 1632    Clinical Impression Statement Patient with ongoing mild swelling in right hand and induration and swelling at upper outer right breast that benefit from therapy.  She did try her Flexitouch over the weekend, but not sure about how she responded to that.  Having newer back pain that concerns her.   Rehab Potential Good   Clinical Impairments Affecting Rehab Potential active cancer   PT Frequency 2x / week   PT Duration 4 weeks   PT Treatment/Interventions Manual techniques;Manual lymph drainage   PT Next Visit Plan Continue manual lymph drainage for right breast and axilla area as well as right UE (and left periscapular area); continue soft tissue work at right thigh for pain relief.   PT Home Exercise Plan progress as needed   Consulted and Agree with Plan of Care Patient   PT Plan Patient very upset at beginning of session today because of increasing back pain she is having related to a bony metastasis in her back at T11 and the difficulty treatment choices she has for this problem.        Patient will benefit from skilled therapeutic intervention in order to improve the following deficits and impairments:  Increased edema, Increased fascial restricitons, Pain  Visit Diagnosis: Lymphedema, not elsewhere classified - Plan: PT plan of care cert/re-cert  Pain in right thigh - Plan: PT plan of  care cert/re-cert  Other muscle spasm - Plan: PT plan of care cert/re-cert     Problem List Patient Active Problem List   Diagnosis Date Noted  . Liver metastases ([place]) 02/23/2016  . 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 Zoster 04/04/2015  . Nausea with vomiting 11/18/2014  . Constipation 11/18/2014  . Left-sided thoracic back pain   .  Bone metastases ([place]) 11/16/2014  . Back pain 11/15/2014  . Uncontrolled pain 11/14/2014  . Post-lymphadenectomy lymphedema of arm 05/31/2014  . Chest wall pain 03/21/2014  . Abnormal LFTs (liver function tests) 09/12/2013  . Breast cancer of upper-inner quadrant of right female breast ([place]) 08/18/2013  . 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

## 2016-03-04 ENCOUNTER — Ambulatory Visit (HOSPITAL_BASED_OUTPATIENT_CLINIC_OR_DEPARTMENT_OTHER): Payer: Self-pay

## 2016-03-04 ENCOUNTER — Ambulatory Visit
Admission: RE | Admit: 2016-03-04 | Discharge: 2016-03-04 | Disposition: A | Discharge status: Home / Self Care | Payer: 59 | Source: Ambulatory Visit | Attending: Radiation Oncology | Admitting: Radiation Oncology

## 2016-03-04 DIAGNOSIS — C7951 Secondary malignant neoplasm of bone: Secondary | ICD-10-CM

## 2016-03-04 DIAGNOSIS — C7952 Secondary malignant neoplasm of bone marrow: Principal | ICD-10-CM

## 2016-03-04 MED ORDER — GADOBENATE DIMEGLUMINE 529 MG/ML IV SOLN
14.0000 mL | Freq: Once | INTRAVENOUS | Status: AC | PRN
  Administered 2016-03-04: 14 mL via INTRAVENOUS

## 2016-03-04 NOTE — Progress Notes (Signed)
Patient here for IV start; 24G placed to left wrist. Pt refusing VS or assessment. Discharged ambulatory in no acute distress; patient to have MRI at [facility].

## 2016-03-05 ENCOUNTER — Ambulatory Visit (HOSPITAL_COMMUNITY): Payer: 59

## 2016-03-05 ENCOUNTER — Ambulatory Visit
Admission: RE | Admit: 2016-03-05 | Discharge: 2016-03-05 | Disposition: A | Discharge status: Home / Self Care | Payer: 59 | Source: Ambulatory Visit | Attending: Radiation Oncology | Admitting: Radiation Oncology

## 2016-03-05 DIAGNOSIS — C7951 Secondary malignant neoplasm of bone: Secondary | ICD-10-CM | POA: Diagnosis present

## 2016-03-05 DIAGNOSIS — Z510 Encounter for antineoplastic radiation therapy: Secondary | ICD-10-CM | POA: Diagnosis present

## 2016-03-05 DIAGNOSIS — Z853 Personal history of malignant neoplasm of breast: Secondary | ICD-10-CM | POA: Diagnosis present

## 2016-03-06 ENCOUNTER — Telehealth: Payer: Self-pay | Admitting: *Deleted

## 2016-03-06 NOTE — Telephone Encounter (Signed)
Vici called to this RN with sudden onset of severe pain " different location " pain is in lower right side of waist.  " sharp - shooting pain " - she noted this post wrapping packages to mail out for Christmas today -   While on the phone she had spasms of pain which then stopped.  Noted Jorga stated concern due to sudden onset and being alone due to her husband is out of town in [place].  This RN spoke with pt during this time and while pain disappeared which pt states was very reassuring.  Per inquiry pain is in area that was previously affected by shingles as well as she states due to positioning for simulation for radiation yesterday she had to lay in awkward positions.  Lucette was concerned with " what is causing this new pain - what is down there that could be causing this "  Per conversation this RN explained probable deferred pain secondary to known area of tumor in her spine and recent positioning which may irritating a nerve.  Discussed use of medications on hand including advil and flexeril. This RN also stated therapy including ice and use of lidoderm 4% patch which is now available OTC.  Reassurance given that she can call if further concerns while her husband is out of town.  Per end of call - Bittany did not have recurrence of pain and verbalized appreciation of conversation.

## 2016-03-07 ENCOUNTER — Ambulatory Visit: Payer: 59 | Admitting: Physical Therapy

## 2016-03-07 DIAGNOSIS — M79651 Pain in right thigh: Secondary | ICD-10-CM

## 2016-03-07 DIAGNOSIS — I890 Lymphedema, not elsewhere classified: Secondary | ICD-10-CM

## 2016-03-07 NOTE — Therapy (Signed)
[place], [place], [address] Phone: [phone]   Fax:  [phone]  Physical Therapy Treatment  Patient Details  Name: [name] [name] MRN: [id number] Date of Birth: 06/20/1960 Referring Provider: Dr. Kyung Rudd  Encounter Date: 03/07/2016      PT End of Session - 03/07/16 1119    Visit Number [id number] for lymph   Number of Visits 99  for lymph   Date for PT Re-Evaluation 03/30/16   Authorization - Visit Number 140   PT Start Time [id number]   PT Stop Time 1110   PT Time Calculation (min) 49 min   Activity Tolerance Patient tolerated treatment well   Behavior During Therapy [facility] for tasks assessed/performed      Past Medical History:  Diagnosis Date  . Bone metastases ([place]) dx'd 05/2014  . Breast cancer ([place]) dx'd 2005/2011  . Peripheral vascular disease ([place]) 02/2010   blood clot related to porta cath  . PONV (postoperative nausea and vomiting)   . S/P radiation therapy 07/17/2014 through 08/02/2014    Left mediastinum, left seventh rib 3250 cGy in 13 sessions   . S/P radiation therapy 12/11/2014 through 12/22/2014    Left parietal calvarium 2400 cGy in 8 sessions   . Seizures ([place]) 2010   Isolated incident.    Past Surgical History:  Procedure Laterality Date  . AXILLARY LYMPH NODE DISSECTION  Dec. 2011  . BREAST LUMPECTOMY  2005  . MEDIASTINOTOMY CHAMBERLAIN MCNEIL Left 06/02/2013   Procedure: MEDIASTINOTOMY CHAMBERLAIN MCNEIL;  Surgeon: [name], MD;  Location: [facility];  Service: Thoracic;  Laterality: Left;  LEFT ANTERIOR MEDIASTINOTOMY   . PORTACATH PLACEMENT  12/11  . removal portacath      There were no vitals filed for this visit.      Subjective Assessment - 03/07/16 1022    Subjective A little tight at the thigh,  but I haven't had the shooting pain.   Currently in Pain? Yes   Pain Score 5    Pain Location Axilla   Pain Orientation Right   Pain Descriptors / Indicators Other (Comment)  tightness, fullness   Pain Type Chronic pain   Aggravating Factors  fullness, increased swelling   Pain Relieving Factors less swelling, cooler weather                         [facility] Adult PT Treatment/Exercise - 03/07/16 0001      Manual Therapy   Myofascial Release right axilla with focus on scar tightness   Manual Lymphatic Drainage (MLD) In left sidelying, posterior interaxillary anastomosis and right axillo-inguinal anastomosis; in supine, short neck, left axilla and anterior interaxillary anastomosis, right groin and axillo-inguinal anastomosis, and right upper extremity from fingers to shoulder.  In right sidelying, left periscapular area toward left groin.   Other Manual Therapy soft tissue work at superior aspect of right medial and upper thigh in prone position for pain relief and muscle relaxation, gentle myofascial release at upper thigh                  PT Short Term Goals - 02/12/16 1226      PT SHORT TERM GOAL #1   Title pain with walking decreased >/= 25%   Time 4   Period Weeks   Status Achieved           PT Long Term Goals - 02/26/16 1231  PT LONG TERM GOAL #1   Title indpendent with HEP   Time 8   Period Weeks   Status On-going     PT LONG TERM GOAL #2   Title pain with walking decreased >/= 75%   Time 8   Period Weeks   Status On-going  50%     PT LONG TERM GOAL #3   Title ability to flex her right hip with right groin pain decreased >/= 50% due to improved tissue mobility   Time 8   Period Weeks   Status On-going  25%           Long Term Clinic Goals - 03/07/16 1121      CC Long Term Goal  #2   Title Pt. will report swelling is adequately managed to enable ADL function at a consistent level.   Status On-going     CC Long Term  Goal  #4   Title Pain/discomfort at right axilla area will be controlled at 6/10 or less.   Status On-going     CC Long Term Goal  #5   Title Patient will avoid infection by ongoing management of her lymphedema at right breast/axilla/upper arm areas.   Status On-going     CC Long Term Goal  #6   Title Pain in right groin/ischial tuberosity area will be controlled at 3/10 level or less to enable walking with less pain.   Status On-going            Plan - 03/07/16 1120    Rehab Potential Good   Clinical Impairments Affecting Rehab Potential active cancer   PT Frequency 2x / week   PT Duration 4 weeks   PT Treatment/Interventions Manual techniques;Manual lymph drainage   PT Next Visit Plan Continue manual lymph drainage for right breast and axilla area as well as right UE (and left periscapular area); continue soft tissue work at right thigh for pain relief.   Consulted and Agree with Plan of Care Patient   PT Plan Patient less anxious today, but still undecided about possible XRT to spine.  Right upper outer breast tissue is softer, less swollen, and less uncomfortable to patient today; right thigh is tight just in one spot at superior lateral aspect.      Patient will benefit from skilled therapeutic intervention in order to improve the following deficits and impairments:  Increased edema, Increased fascial restricitons, Pain  Visit Diagnosis: Lymphedema, not elsewhere classified  Pain in right thigh     Problem List Patient Active Problem List   Diagnosis Date Noted  . Liver metastases ([place]) 02/23/2016  . 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 Zoster 04/04/2015  . Nausea with vomiting 11/18/2014  . Constipation 11/18/2014  . Left-sided thoracic back pain   . Bone metastases ([place]) 11/16/2014  . Back pain 11/15/2014  . Uncontrolled pain 11/14/2014  . Post-lymphadenectomy lymphedema of arm 05/31/2014  . Chest wall pain 03/21/2014  . Abnormal LFTs (liver  function tests) 09/12/2013  . Breast cancer of upper-inner quadrant of right female breast ([place]) 08/18/2013  . 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

## 2016-03-10 ENCOUNTER — Ambulatory Visit: Payer: 59

## 2016-03-10 ENCOUNTER — Other Ambulatory Visit (HOSPITAL_BASED_OUTPATIENT_CLINIC_OR_DEPARTMENT_OTHER): Payer: 59

## 2016-03-10 ENCOUNTER — Ambulatory Visit: Payer: 59 | Admitting: Physical Therapy

## 2016-03-10 DIAGNOSIS — C7951 Secondary malignant neoplasm of bone: Secondary | ICD-10-CM | POA: Diagnosis not present

## 2016-03-10 DIAGNOSIS — C50211 Malignant neoplasm of upper-inner quadrant of right female breast: Secondary | ICD-10-CM

## 2016-03-10 DIAGNOSIS — I890 Lymphedema, not elsewhere classified: Secondary | ICD-10-CM | POA: Diagnosis not present

## 2016-03-10 DIAGNOSIS — C771 Secondary and unspecified malignant neoplasm of intrathoracic lymph nodes: Secondary | ICD-10-CM

## 2016-03-10 DIAGNOSIS — J90 Pleural effusion, not elsewhere classified: Secondary | ICD-10-CM

## 2016-03-10 DIAGNOSIS — M79651 Pain in right thigh: Secondary | ICD-10-CM

## 2016-03-10 LAB — CBC WITH DIFFERENTIAL/PLATELET
BASO%: 1.4 % (ref 0.0–2.0)
Basophils Absolute: 0 10*3/uL (ref 0.0–0.1)
EOS ABS: 0.1 10*3/uL (ref 0.0–0.5)
EOS%: 2.1 % (ref 0.0–7.0)
HCT: 35.7 % (ref 34.8–46.6)
HGB: 12.5 g/dL (ref 11.6–15.9)
LYMPH%: 16 % (ref 14.0–49.7)
MCH: 36.7 pg — ABNORMAL HIGH (ref 25.1–34.0)
MCHC: 35 g/dL (ref 31.5–36.0)
MCV: 104.7 fL — AB (ref 79.5–101.0)
MONO#: 0.2 10*3/uL (ref 0.1–0.9)
MONO%: 7.4 % (ref 0.0–14.0)
NEUT%: 73.1 % (ref 38.4–76.8)
NEUTROS ABS: 2.1 10*3/uL (ref 1.5–6.5)
Platelets: 215 10*3/uL (ref 145–400)
RBC: 3.41 10*6/uL — AB (ref 3.70–5.45)
RDW: 13.2 % (ref 11.2–14.5)
WBC: 2.8 10*3/uL — AB (ref 3.9–10.3)
lymph#: 0.5 10*3/uL — ABNORMAL LOW (ref 0.9–3.3)

## 2016-03-10 LAB — COMPREHENSIVE METABOLIC PANEL
ALT: 44 U/L (ref 0–55)
AST: 37 U/L — ABNORMAL HIGH (ref 5–34)
Albumin: 3.9 g/dL (ref 3.5–5.0)
Alkaline Phosphatase: 87 U/L (ref 40–150)
Anion Gap: 10 mEq/L (ref 3–11)
BILIRUBIN TOTAL: 0.4 mg/dL (ref 0.20–1.20)
BUN: 10.5 mg/dL (ref 7.0–26.0)
CO2: 26 meq/L (ref 22–29)
Calcium: 10.1 mg/dL (ref 8.4–10.4)
Chloride: 102 mEq/L (ref 98–109)
Creatinine: 0.9 mg/dL (ref 0.6–1.1)
EGFR: 76 mL/min/{1.73_m2} — AB (ref >90)
GLUCOSE: 89 mg/dL (ref 70–140)
POTASSIUM: 4.4 meq/L (ref 3.5–5.1)
SODIUM: 137 meq/L (ref 136–145)
TOTAL PROTEIN: 7.5 g/dL (ref 6.4–8.3)

## 2016-03-10 NOTE — Therapy (Signed)
[place], [place], [address] Phone: [phone]   Fax:  [phone]  Physical Therapy Treatment  Patient Details  Name: [name] [name] MRN: [id number] Date of Birth: 1960-10-08 Referring Provider: Dr. Kyung Rudd  Encounter Date: 03/10/2016      PT End of Session - 03/10/16 1234    Visit Number 133  94 for lymph   Number of Visits 99  for lymph   Date for PT Re-Evaluation 03/30/16   Authorization - Visit Number 140   PT Start Time [id number]   PT Stop Time 1102   PT Time Calculation (min) 47 min   Activity Tolerance Patient tolerated treatment well   Behavior During Therapy [facility] for tasks assessed/performed      Past Medical History:  Diagnosis Date  . Bone metastases ([place]) dx'd 05/2014  . Breast cancer ([place]) dx'd 2005/2011  . Peripheral vascular disease ([place]) 02/2010   blood clot related to porta cath  . PONV (postoperative nausea and vomiting)   . S/P radiation therapy 07/17/2014 through 08/02/2014    Left mediastinum, left seventh rib 3250 cGy in 13 sessions   . S/P radiation therapy 12/11/2014 through 12/22/2014    Left parietal calvarium 2400 cGy in 8 sessions   . Seizures ([place]) 2010   Isolated incident.    Past Surgical History:  Procedure Laterality Date  . AXILLARY LYMPH NODE DISSECTION  Dec. 2011  . BREAST LUMPECTOMY  2005  . MEDIASTINOTOMY CHAMBERLAIN MCNEIL Left 06/02/2013   Procedure: MEDIASTINOTOMY CHAMBERLAIN MCNEIL;  Surgeon: [name], MD;  Location: [place];  Service: Thoracic;  Laterality: Left;  LEFT ANTERIOR MEDIASTINOTOMY   . PORTACATH PLACEMENT  12/11  . removal portacath      There were no vitals filed for this visit.      Subjective Assessment - 03/10/16 1015    Subjective Hand is a little swollen  today, and groin is a little uncomfortable from getting out and about this weekend.   Currently in Pain? Yes   Pain Score 6    Pain Location Axilla   Pain Orientation Right   Pain Descriptors / Indicators Other (Comment)  tightness   Pain Score 1   Pain Location Groin   Pain Orientation Right   Pain Descriptors / Indicators Other (Comment)  "an awareness, but not painful"            [facility] PT Assessment - 03/10/16 0001      Observation/Other Assessments   Observations right hand looks slightly more swollen today                     [facility] Adult PT Treatment/Exercise - 03/10/16 0001      Manual Therapy   Myofascial Release right axilla with focus on scar tightness   Manual Lymphatic Drainage (MLD) In left sidelying, posterior interaxillary anastomosis and right axillo-inguinal anastomosis; in supine, short neck, left axilla and anterior interaxillary anastomosis, right groin and axillo-inguinal anastomosis, and right upper extremity from fingers to shoulder.  In right sidelying, left periscapular area toward left groin.   Other Manual Therapy soft tissue work at superior aspect of right medial and upper thigh in prone position for pain relief and muscle relaxation, gentle myofascial release at upper thigh                  PT Short Term Goals - 02/12/16 1226      PT SHORT TERM GOAL #1  Title pain with walking decreased >/= 25%   Time 4   Period Weeks   Status Achieved           PT Long Term Goals - 02/26/16 1231      PT LONG TERM GOAL #1   Title indpendent with HEP   Time 8   Period Weeks   Status On-going     PT LONG TERM GOAL #2   Title pain with walking decreased >/= 75%   Time 8   Period Weeks   Status On-going  50%     PT LONG TERM GOAL #3   Title ability to flex her right hip with right groin pain decreased >/= 50% due to improved tissue mobility   Time 8   Period Weeks   Status On-going  25%           Long Term Clinic  Goals - 03/07/16 1121      CC Long Term Goal  #2   Title Pt. will report swelling is adequately managed to enable ADL function at a consistent level.   Status On-going     CC Long Term Goal  #4   Title Pain/discomfort at right axilla area will be controlled at 6/10 or less.   Status On-going     CC Long Term Goal  #5   Title Patient will avoid infection by ongoing management of her lymphedema at right breast/axilla/upper arm areas.   Status On-going     CC Long Term Goal  #6   Title Pain in right groin/ischial tuberosity area will be controlled at 3/10 level or less to enable walking with less pain.   Status On-going            Plan - 03/10/16 1234    Clinical Impression Statement Right upper outer breast tissue mostly soft again today.  Mild increase in swelling of hand compared to 03/07/16.   Rehab Potential Good   Clinical Impairments Affecting Rehab Potential active cancer   PT Frequency 2x / week   PT Duration 4 weeks   PT Treatment/Interventions Manual techniques;Manual lymph drainage   PT Next Visit Plan Continue manual lymph drainage for right breast and axilla area as well as right UE (and left periscapular area); continue soft tissue work at right thigh for pain relief.   Consulted and Agree with Plan of Care Patient      Patient will benefit from skilled therapeutic intervention in order to improve the following deficits and impairments:  Increased edema, Increased fascial restricitons, Pain  Visit Diagnosis: Lymphedema, not elsewhere classified  Pain in right thigh     Problem List Patient Active Problem List   Diagnosis Date Noted  . Liver metastases ([place]) 02/23/2016  . 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 Zoster 04/04/2015  . Nausea with vomiting 11/18/2014  . Constipation 11/18/2014  . Left-sided thoracic back pain   . Bone metastases ([place]) 11/16/2014  . Back pain 11/15/2014  . Uncontrolled pain 11/14/2014  . Post-lymphadenectomy  lymphedema of arm 05/31/2014  . Chest wall pain 03/21/2014  . Abnormal LFTs (liver function tests) 09/12/2013  . Breast cancer of upper-inner quadrant of right female breast ([place]) 08/18/2013  . 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

## 2016-03-11 ENCOUNTER — Encounter: Payer: Self-pay | Admitting: Physical Therapy

## 2016-03-11 ENCOUNTER — Ambulatory Visit (HOSPITAL_BASED_OUTPATIENT_CLINIC_OR_DEPARTMENT_OTHER): Payer: 59

## 2016-03-11 ENCOUNTER — Ambulatory Visit: Payer: 59 | Attending: Radiation Oncology | Admitting: Physical Therapy

## 2016-03-11 DIAGNOSIS — C7951 Secondary malignant neoplasm of bone: Secondary | ICD-10-CM | POA: Diagnosis not present

## 2016-03-11 DIAGNOSIS — C50211 Malignant neoplasm of upper-inner quadrant of right female breast: Secondary | ICD-10-CM

## 2016-03-11 DIAGNOSIS — M62838 Other muscle spasm: Secondary | ICD-10-CM | POA: Diagnosis not present

## 2016-03-11 DIAGNOSIS — Z5111 Encounter for antineoplastic chemotherapy: Secondary | ICD-10-CM

## 2016-03-11 DIAGNOSIS — C771 Secondary and unspecified malignant neoplasm of intrathoracic lymph nodes: Secondary | ICD-10-CM

## 2016-03-11 LAB — CANCER ANTIGEN 27.29: CA 27.29: 2652 U/mL — ABNORMAL HIGH (ref 0.0–38.6)

## 2016-03-11 MED ORDER — DENOSUMAB 120 MG/1.7ML ~~LOC~~ SOLN
120.0000 mg | Freq: Once | SUBCUTANEOUS | Status: AC
  Administered 2016-03-11: 120 mg via SUBCUTANEOUS
  Filled 2016-03-11: qty 1.7

## 2016-03-11 MED ORDER — FULVESTRANT 250 MG/5ML IM SOLN
500.0000 mg | Freq: Once | INTRAMUSCULAR | Status: AC
  Administered 2016-03-11: 500 mg via INTRAMUSCULAR
  Filled 2016-03-11: qty 10

## 2016-03-11 NOTE — Therapy (Signed)
[facility] [address], [place], [place], [address] Phone: [phone]   Fax:  [phone]  Physical Therapy Treatment  Patient Details  Name: [name] [name] MRN: [id number] Date of Birth: 13-May-1960 Referring Provider: Dr. Kyung Rudd  Encounter Date: 03/11/2016      PT End of Session - 03/11/16 1240    Visit Number 134   Date for PT Re-Evaluation 05/07/15   Authorization - Visit Number 140   Authorization - Number of Visits 134   PT Start Time [id number]   PT Stop Time 1310   PT Time Calculation (min) 40 min   Activity Tolerance Patient tolerated treatment well   Behavior During Therapy [facility] for tasks assessed/performed      Past Medical History:  Diagnosis Date  . Bone metastases ([place]) dx'd 05/2014  . Breast cancer ([place]) dx'd 2005/2011  . Peripheral vascular disease ([place]) 02/2010   blood clot related to porta cath  . PONV (postoperative nausea and vomiting)   . S/P radiation therapy 07/17/2014 through 08/02/2014    Left mediastinum, left seventh rib 3250 cGy in 13 sessions   . S/P radiation therapy 12/11/2014 through 12/22/2014    Left parietal calvarium 2400 cGy in 8 sessions   . Seizures ([place]) 2010   Isolated incident.    Past Surgical History:  Procedure Laterality Date  . AXILLARY LYMPH NODE DISSECTION  Dec. 2011  . BREAST LUMPECTOMY  2005  . MEDIASTINOTOMY CHAMBERLAIN MCNEIL Left 06/02/2013   Procedure: MEDIASTINOTOMY CHAMBERLAIN MCNEIL;  Surgeon: [name], MD;  Location: [place];  Service: Thoracic;  Laterality: Left;  LEFT ANTERIOR MEDIASTINOTOMY   . PORTACATH PLACEMENT  12/11  . removal portacath      There were no vitals filed for this visit.      Subjective Assessment - 03/11/16 1233    Subjective I woke up yesterday due to  pain in right buttocks.  I am going in for radiosurgery to my T11 tomorrow. Areas of growth is liver and T11.    How long can you walk comfortably? more she walks patient will have increased pain that stops her from walking further   Diagnostic tests x rays of back show no fractures;    Patient Stated Goals walk 20 minutes with minimal limp   Currently in Pain? Yes   Pain Location Buttocks   Pain Orientation Right   Pain Type Chronic pain   Pain Onset More than a month ago   Pain Frequency Intermittent   Aggravating Factors  stepping on step stool   Pain Relieving Factors manual therapy   Multiple Pain Sites No            [facility] PT Assessment - 03/11/16 1315      Assessment   Medical Diagnosis Osseous Metastasis   Referring Provider Dr. Kyung Rudd   Prior Therapy yes for the pelvic floor and lymphedema     Precautions   Precautions Other (comment)   Precaution Comments No ultrasound     Balance Screen   Has the patient fallen in the past 6 months No   Has the patient had a decrease in activity level because of a fear of falling?  No   Is the patient reluctant to leave their home because of a fear of falling?  No     Home Ecologist residence     Prior Function   Level of Independence Independent  Cognition   Overall Cognitive Status Within Functional Limits for tasks assessed     Observation/Other Assessments   Focus on Therapeutic Outcomes (FOTO)  42% limitation     Posture/Postural Control   Posture/Postural Control No significant limitations     Strength   Overall Strength Comments bil. hip strength is 4/5; right knee strength is 4/5   Right Hip Flexion 4+/5   Right Hip External Rotation  4+/5   Right Hip ABduction 5/5     Ambulation/Gait   Ambulation/Gait Yes   Assistive device None   Gait Pattern Decreased weight shift to right;Decreased stance time - right                  Pelvic Floor Special Questions -  03/11/16 0001    Pelvic Floor Internal Exam Patient approves physical therapist to perform pelvic floor muscle assessment   Exam Type Vaginal   Strength strong squeeze, against strong resistance           [facility] Adult PT Treatment/Exercise - 03/11/16 0001      Manual Therapy   Manual Therapy Internal Pelvic Floor;Soft tissue mobilization   Soft tissue mobilization right piriformis, right gluteal, right hamstring   Internal Pelvic Floor right side of levator ani and obturator internist                PT Education - 03/11/16 1321    Education provided No          PT Short Term Goals - 02/12/16 1226      PT SHORT TERM GOAL #1   Title pain with walking decreased >/= 25%   Time 4   Period Weeks   Status Achieved           PT Long Term Goals - 03/11/16 1308      PT LONG TERM GOAL #1   Title indpendent with HEP   Time 8   Period Weeks   Status On-going     PT LONG TERM GOAL #2   Title pain with walking decreased >/= 75%   Time 8   Period Weeks   Status On-going  60% better     PT LONG TERM GOAL #3   Title ability to flex her right hip with right groin pain decreased >/= 50% due to improved tissue mobility   Time 8   Period Weeks   Status Achieved  50% better     PT LONG TERM GOAL #4   Title waking up in middle of night with pain decreased >/=50%   Time 8   Period Weeks   Status New     PT LONG TERM GOAL #5   Title reduction of pain by end of day >/= 50% due to increase tissue mobility   Time 8   Period Weeks   Status New           Long Term Clinic Goals - 03/07/16 1121      CC Long Term Goal  #2   Title Pt. will report swelling is adequately managed to enable ADL function at a consistent level.   Status On-going     CC Long Term Goal  #4   Title Pain/discomfort at right axilla area will be controlled at 6/10 or less.   Status On-going     CC Long Term Goal  #5   Title Patient will avoid infection by ongoing management of her  lymphedema at right breast/axilla/upper arm areas.   Status On-going  CC Long Term Goal  #6   Title Pain in right groin/ischial tuberosity area will be controlled at 3/10 level or less to enable walking with less pain.   Status On-going            Plan - 03/11/16 1321    Rehab Potential Good   Clinical Impairments Affecting Rehab Potential active cancer   PT Frequency 1x / week   PT Duration 8 weeks   PT Treatment/Interventions Therapeutic activities;Therapeutic exercise;Patient/family education;Manual techniques   PT Next Visit Plan manual work   PT Home Exercise Plan progress as needed   Consulted and Agree with Plan of Care Patient   PT Plan Patient pain has decreased by 50%.  Patient is not using a cane anymore but has limp on the right. Patient pain will change daily due to the progression of her cancer and how it spreads. Patient now will wake up in the middle of the night due to pain.  Patient pain will increase toward the end of the day. Patient will benefit from skilled physical therapy to reduce pain and muscle spasm.       Patient will benefit from skilled therapeutic intervention in order to improve the following deficits and impairments:  Increased edema, Increased fascial restricitons, Pain  Visit Diagnosis: Other muscle spasm - Plan: PT plan of care cert/re-cert     Problem List Patient Active Problem List   Diagnosis Date Noted  . Liver metastases ([place]) 02/23/2016  . 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 Zoster 04/04/2015  . Nausea with vomiting 11/18/2014  . Constipation 11/18/2014  . Left-sided thoracic back pain   . Bone metastases ([place]) 11/16/2014  . Back pain 11/15/2014  . Uncontrolled pain 11/14/2014  . Post-lymphadenectomy lymphedema of arm 05/31/2014  . Chest wall pain 03/21/2014  . Abnormal LFTs (liver function tests) 09/12/2013  . Breast cancer of upper-inner quadrant of right female breast ([place]) 08/18/2013  . Secondary malignant  neoplasm of mediastinal lymph node ([place]) 08/18/2013    [name], PT 03/11/16 1:27 PM   [facility] [address] [place], [place], [address] Phone: [phone]   Fax:  [phone]  Name: [name] [name] MRN: [id number] Date of Birth: 1961-03-23

## 2016-03-11 NOTE — Patient Instructions (Signed)
Denosumab injection What is this medicine? DENOSUMAB (den oh sue mab) slows bone breakdown. Prolia is used to treat osteoporosis in women after menopause and in men. Xgeva is used to prevent bone fractures and other bone problems caused by cancer bone metastases. Xgeva is also used to treat giant cell tumor of the bone. This medicine may be used for other purposes; ask your health care provider or pharmacist if you have questions. What should I tell my health care provider before I take this medicine? They need to know if you have any of these conditions: -dental disease -eczema -infection or history of infections -kidney disease or on dialysis -low blood calcium or vitamin D -malabsorption syndrome -scheduled to have surgery or tooth extraction -taking medicine that contains denosumab -thyroid or parathyroid disease -an unusual reaction to denosumab, other medicines, foods, dyes, or preservatives -pregnant or trying to get pregnant -breast-feeding How should I use this medicine? This medicine is for injection under the skin. It is given by a health care professional in a hospital or clinic setting. If you are getting Prolia, a special MedGuide will be given to you by the pharmacist with each prescription and refill. Be sure to read this information carefully each time. For Prolia, talk to your pediatrician regarding the use of this medicine in children. Special care may be needed. For Xgeva, talk to your pediatrician regarding the use of this medicine in children. While this drug may be prescribed for children as young as 13 years for selected conditions, precautions do apply. Overdosage: If you think you have taken too much of this medicine contact a poison control center or emergency room at once. NOTE: This medicine is only for you. Do not share this medicine with others. What if I miss a dose? It is important not to miss your dose. Call your doctor or health care professional if you are  unable to keep an appointment. What may interact with this medicine? Do not take this medicine with any of the following medications: -other medicines containing denosumab This medicine may also interact with the following medications: -medicines that suppress the immune system -medicines that treat cancer -steroid medicines like prednisone or cortisone This list may not describe all possible interactions. Give your health care provider a list of all the medicines, herbs, non-prescription drugs, or dietary supplements you use. Also tell them if you smoke, drink alcohol, or use illegal drugs. Some items may interact with your medicine. What should I watch for while using this medicine? Visit your doctor or health care professional for regular checks on your progress. Your doctor or health care professional may order blood tests and other tests to see how you are doing. Call your doctor or health care professional if you get a cold or other infection while receiving this medicine. Do not treat yourself. This medicine may decrease your body's ability to fight infection. You should make sure you get enough calcium and vitamin D while you are taking this medicine, unless your doctor tells you not to. Discuss the foods you eat and the vitamins you take with your health care professional. See your dentist regularly. Brush and floss your teeth as directed. Before you have any dental work done, tell your dentist you are receiving this medicine. Do not become pregnant while taking this medicine or for 5 months after stopping it. Women should inform their doctor if they wish to become pregnant or think they might be pregnant. There is a potential for serious side effects   to an unborn child. Talk to your health care professional or pharmacist for more information. What side effects may I notice from receiving this medicine? Side effects that you should report to your doctor or health care professional as soon as  possible: -allergic reactions like skin rash, itching or hives, swelling of the face, lips, or tongue -breathing problems -chest pain -fast, irregular heartbeat -feeling faint or lightheaded, falls -fever, chills, or any other sign of infection -muscle spasms, tightening, or twitches -numbness or tingling -skin blisters or bumps, or is dry, peels, or red -slow healing or unexplained pain in the mouth or jaw -unusual bleeding or bruising Side effects that usually do not require medical attention (Report these to your doctor or health care professional if they continue or are bothersome.): -muscle pain -stomach upset, gas This list may not describe all possible side effects. Call your doctor for medical advice about side effects. You may report side effects to FDA at 1-800-FDA-1088. Where should I keep my medicine? This medicine is only given in a clinic, doctor's office, or other health care setting and will not be stored at home. NOTE: This sheet is a summary. It may not cover all possible information. If you have questions about this medicine, talk to your doctor, pharmacist, or health care provider.    2016, Elsevier/Gold Standard. (2011-09-15 12:37:47) Fulvestrant injection What is this medicine? FULVESTRANT (ful VES trant) blocks the effects of estrogen. It is used to treat breast cancer. This medicine may be used for other purposes; ask your health care provider or pharmacist if you have questions. What should I tell my health care provider before I take this medicine? They need to know if you have any of these conditions: -bleeding problems -liver disease -low levels of platelets in the blood -an unusual or allergic reaction to fulvestrant, other medicines, foods, dyes, or preservatives -pregnant or trying to get pregnant -breast-feeding How should I use this medicine? This medicine is for injection into a muscle. It is usually given by a health care professional in a  hospital or clinic setting. Talk to your pediatrician regarding the use of this medicine in children. Special care may be needed. Overdosage: If you think you have taken too much of this medicine contact a poison control center or emergency room at once. NOTE: This medicine is only for you. Do not share this medicine with others. What if I miss a dose? It is important not to miss your dose. Call your doctor or health care professional if you are unable to keep an appointment. What may interact with this medicine? -medicines that treat or prevent blood clots like warfarin, enoxaparin, and dalteparin This list may not describe all possible interactions. Give your health care provider a list of all the medicines, herbs, non-prescription drugs, or dietary supplements you use. Also tell them if you smoke, drink alcohol, or use illegal drugs. Some items may interact with your medicine. What should I watch for while using this medicine? Your condition will be monitored carefully while you are receiving this medicine. You will need important blood work done while you are taking this medicine. Do not become pregnant while taking this medicine or for at least 1 year after stopping it. Women of child-bearing potential will need to have a negative pregnancy test before starting this medicine. Women should inform their doctor if they wish to become pregnant or think they might be pregnant. There is a potential for serious side effects to an unborn child. Men   should inform their doctors if they wish to father a child. This medicine may lower sperm counts. Talk to your health care professional or pharmacist for more information. Do not breast-feed an infant while taking this medicine or for 1 year after the last dose. What side effects may I notice from receiving this medicine? Side effects that you should report to your doctor or health care professional as soon as possible: -allergic reactions like skin rash,  itching or hives, swelling of the face, lips, or tongue -feeling faint or lightheaded, falls -pain, tingling, numbness, or weakness in the legs -signs and symptoms of infection like fever or chills; cough; flu-like symptoms; sore throat -vaginal bleeding Side effects that usually do not require medical attention (report to your doctor or health care professional if they continue or are bothersome): -aches, pains -constipation -diarrhea -headache -hot flashes -nausea, vomiting -pain at site where injected -stomach pain This list may not describe all possible side effects. Call your doctor for medical advice about side effects. You may report side effects to FDA at 1-800-FDA-1088. Where should I keep my medicine? This drug is given in a hospital or clinic and will not be stored at home. NOTE: This sheet is a summary. It may not cover all possible information. If you have questions about this medicine, talk to your doctor, pharmacist, or health care provider.    2016, Elsevier/Gold Standard. (2014-10-13 11:03:55)  

## 2016-03-12 ENCOUNTER — Ambulatory Visit
Admission: RE | Admit: 2016-03-12 | Discharge: 2016-03-12 | Disposition: A | Discharge status: Home / Self Care | Payer: 59 | Source: Ambulatory Visit | Attending: Radiation Oncology | Admitting: Radiation Oncology

## 2016-03-12 ENCOUNTER — Encounter: Payer: Self-pay | Admitting: Radiation Oncology

## 2016-03-12 ENCOUNTER — Ambulatory Visit: Payer: 59

## 2016-03-12 VITALS — BP 125/96 | HR 99 | Temp 98.3°F

## 2016-03-12 DIAGNOSIS — C7951 Secondary malignant neoplasm of bone: Secondary | ICD-10-CM

## 2016-03-12 DIAGNOSIS — Z510 Encounter for antineoplastic radiation therapy: Secondary | ICD-10-CM | POA: Diagnosis not present

## 2016-03-12 MED ORDER — METHYLPREDNISOLONE 4 MG PO TABS: ORAL_TABLET | ORAL | 0 refills | Status: DC

## 2016-03-12 NOTE — Progress Notes (Signed)
[name] [name] received her SRS to T11. She was brought to nursing and vital signs performed that were stable. She voiced no concerns including pain. She was released home after 30 minutes of observation. She knows to call if she has any further questions or concerns. Dr. Lisbeth Renshaw sent a prescription in for a medrol dose pak to her pharmacy of choice.  BP (!) 125/96   Pulse 99   Temp 98.3 F (36.8 C)   SpO2 99% Comment: room air

## 2016-03-13 ENCOUNTER — Telehealth: Payer: Self-pay | Admitting: *Deleted

## 2016-03-13 ENCOUNTER — Ambulatory Visit: Payer: 59 | Admitting: Oncology

## 2016-03-13 NOTE — Telephone Encounter (Signed)
"  Dr. Jana Hakim scheduled today's appointment after the radiotherapy procedure.  I feel good knock on wood.  Please cancel today's appointment and I'll see him next Tuesday at our standard appointment time."  Will notify provider she will not be here this afternoon.

## 2016-03-13 NOTE — Progress Notes (Signed)
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  Beams/energy:   SBRT/SRT-3D // 10FFF  Narrative: The patient tolerated radiation treatment relatively well.   The patient had no adverse effects with SRS treatment.   Plan: The patient has completed radiation treatment. I sent a prescription in for a medrol dose pak. The patient will return to radiation oncology clinic for routine followup in one month. I advised them to call or return sooner if they have any questions or concerns related to their recovery or treatment.  ------------------------------------------------  [name] Gross, MD, PhD  This document serves as a record of services personally performed by Kyung Rudd, MD. It was created on his behalf by Arlyce Harman, a trained medical scribe. The creation of this record is based on the scribe's personal observations and the provider's statements to them. This document has been checked and approved by the attending provider.

## 2016-03-14 ENCOUNTER — Ambulatory Visit: Payer: 59 | Admitting: Physical Therapy

## 2016-03-14 ENCOUNTER — Other Ambulatory Visit: Payer: Self-pay | Admitting: Oncology

## 2016-03-14 DIAGNOSIS — I890 Lymphedema, not elsewhere classified: Secondary | ICD-10-CM

## 2016-03-14 DIAGNOSIS — M79651 Pain in right thigh: Secondary | ICD-10-CM

## 2016-03-14 NOTE — Progress Notes (Signed)
  [name] [name] showed 03/14/2016 concerned that the lesions below might be shingles. They are clearly not bad. They are related to her prior radiation treatment measurement. These require no further evaluation.

## 2016-03-14 NOTE — Telephone Encounter (Signed)
err

## 2016-03-14 NOTE — Therapy (Signed)
[place], [place], [address] Phone: [phone]   Fax:  [phone]  Physical Therapy Treatment  Patient Details  Name: [name] [name] MRN: [id number] Date of Birth: [birth year] Referring Provider: Dr. Kyung Rudd  Encounter Date: 03/14/2016      PT End of Session - 03/14/16 1807    Visit Number 135  95 for lymph   Number of Visits 99  for lymph   Date for PT Re-Evaluation 03/30/16  lymph   Authorization - Visit Number 140   PT Start Time 1018   PT Stop Time 1101   PT Time Calculation (min) 43 min   Activity Tolerance Patient tolerated treatment well   Behavior During Therapy [facility] for tasks assessed/performed      Past Medical History:  Diagnosis Date  . Bone metastases ([place]) dx'd 05/2014  . Breast cancer ([place]) dx'd 2005/2011  . Peripheral vascular disease ([place]) 02/2010   blood clot related to porta cath  . PONV (postoperative nausea and vomiting)   . S/P radiation therapy 07/17/2014 through 08/02/2014    Left mediastinum, left seventh rib 3250 cGy in 13 sessions   . S/P radiation therapy 12/11/2014 through 12/22/2014    Left parietal calvarium 2400 cGy in 8 sessions   . Seizures ([place]) 2010   Isolated incident.    Past Surgical History:  Procedure Laterality Date  . AXILLARY LYMPH NODE DISSECTION  Dec. 2011  . BREAST LUMPECTOMY  2005  . MEDIASTINOTOMY CHAMBERLAIN MCNEIL Left 06/02/2013   Procedure: MEDIASTINOTOMY CHAMBERLAIN MCNEIL;  Surgeon: [name], MD;  Location: [place];  Service: Thoracic;  Laterality: Left;  LEFT ANTERIOR MEDIASTINOTOMY   . PORTACATH PLACEMENT  12/11  . removal portacath      There were no vitals filed for this visit.      Subjective Assessment - 03/14/16 1022    Subjective Had the radiation  treatment Wednesday.  Has a couple of small open skin spots that could be a recurrence of shingles.   Currently in Pain? Yes   Pain Score 5    Pain Location Leg   Pain Orientation Right;Posterior   Aggravating Factors  pelvic therapy   Pain Relieving Factors aleve   Pain Location Axilla   Pain Orientation Right                         [facility] Adult PT Treatment/Exercise - 03/14/16 0001      Manual Therapy   Myofascial Release right axilla with focus on scar tightness   Manual Lymphatic Drainage (MLD) In left sidelying, posterior interaxillary anastomosis and right axillo-inguinal anastomosis; in supine, short neck, left axilla and anterior interaxillary anastomosis, right groin and axillo-inguinal anastomosis, and right upper extremity from fingers to shoulder.  In right sidelying, left periscapular area toward left groin.   Other Manual Therapy soft tissue work at superior aspect of right medial and upper thigh in prone position for pain relief and muscle relaxation, gentle myofascial release at upper thigh, with releases today from far medial to lateral aspects of posterior thigh                  PT Short Term Goals - 02/12/16 1226      PT SHORT TERM GOAL #1   Title pain with walking decreased >/= 25%   Time 4   Period Weeks   Status Achieved  PT Long Term Goals - 03/11/16 1308      PT LONG TERM GOAL #1   Title indpendent with HEP   Time 8   Period Weeks   Status On-going     PT LONG TERM GOAL #2   Title pain with walking decreased >/= 75%   Time 8   Period Weeks   Status On-going  60% better     PT LONG TERM GOAL #3   Title ability to flex her right hip with right groin pain decreased >/= 50% due to improved tissue mobility   Time 8   Period Weeks   Status Achieved  50% better     PT LONG TERM GOAL #4   Title waking up in middle of night with pain decreased >/=50%   Time 8   Period Weeks   Status New     PT LONG TERM  GOAL #5   Title reduction of pain by end of day >/= 50% due to increase tissue mobility   Time 8   Period Weeks   Status New           Long Term Clinic Goals - 03/07/16 1121      CC Long Term Goal  #2   Title Pt. will report swelling is adequately managed to enable ADL function at a consistent level.   Status On-going     CC Long Term Goal  #4   Title Pain/discomfort at right axilla area will be controlled at 6/10 or less.   Status On-going     CC Long Term Goal  #5   Title Patient will avoid infection by ongoing management of her lymphedema at right breast/axilla/upper arm areas.   Status On-going     CC Long Term Goal  #6   Title Pain in right groin/ischial tuberosity area will be controlled at 3/10 level or less to enable walking with less pain.   Status On-going            Plan - 03/14/16 1809    Rehab Potential Good   Clinical Impairments Affecting Rehab Potential active cancer   PT Frequency 2x / week   PT Duration 4 weeks   PT Treatment/Interventions Therapeutic activities;Therapeutic exercise;Patient/family education;Manual techniques;Manual lymph drainage   PT Next Visit Plan manual lymph drainage, soft tissue work, myofascial release   Consulted and Agree with Plan of Care Patient   PT Plan Patient did well with SBRT to T11 vertebra on Wednesday, a relief to her as she was quite worried about it.  She has had an increase in right thigh pain the last couple of days.  Lymphedema still evident in hand and upperouter right breast, but breast swelling a bit less indurated the last couple of weeks.      Patient will benefit from skilled therapeutic intervention in order to improve the following deficits and impairments:  Increased edema, Increased fascial restricitons, Pain  Visit Diagnosis: Lymphedema, not elsewhere classified  Pain in right thigh     Problem List Patient Active Problem List   Diagnosis Date Noted  . Liver metastases ([place]) 02/23/2016   . 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 Zoster 04/04/2015  . Nausea with vomiting 11/18/2014  . Constipation 11/18/2014  . Left-sided thoracic back pain   . Bone metastases ([place]) 11/16/2014  . Back pain 11/15/2014  . Uncontrolled pain 11/14/2014  . Post-lymphadenectomy lymphedema of arm 05/31/2014  . Chest wall pain 03/21/2014  . Abnormal LFTs (liver function tests)  09/12/2013  . Malignant neoplasm of upper-inner quadrant of right breast in female, estrogen receptor positive ([place]) 08/18/2013  . 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, [place], [address] Phone: [phone]   Fax:  [phone]  Name: HARKIRAT DOBIAS MRN: [id number] Date of Birth: 1960/06/10  [name], PT 03/14/16 6:13 PM

## 2016-03-17 ENCOUNTER — Ambulatory Visit: Payer: 59 | Admitting: Physical Therapy

## 2016-03-17 ENCOUNTER — Other Ambulatory Visit (HOSPITAL_BASED_OUTPATIENT_CLINIC_OR_DEPARTMENT_OTHER): Payer: 59

## 2016-03-17 ENCOUNTER — Encounter: Payer: 59 | Admitting: Physical Therapy

## 2016-03-17 DIAGNOSIS — C50211 Malignant neoplasm of upper-inner quadrant of right female breast: Secondary | ICD-10-CM | POA: Diagnosis not present

## 2016-03-17 DIAGNOSIS — C771 Secondary and unspecified malignant neoplasm of intrathoracic lymph nodes: Secondary | ICD-10-CM

## 2016-03-17 DIAGNOSIS — J90 Pleural effusion, not elsewhere classified: Secondary | ICD-10-CM

## 2016-03-17 DIAGNOSIS — M79651 Pain in right thigh: Secondary | ICD-10-CM

## 2016-03-17 DIAGNOSIS — I890 Lymphedema, not elsewhere classified: Secondary | ICD-10-CM | POA: Diagnosis not present

## 2016-03-17 LAB — CBC WITH DIFFERENTIAL/PLATELET
BASO%: 3.8 % — AB (ref 0.0–2.0)
Basophils Absolute: 0.1 10*3/uL (ref 0.0–0.1)
EOS%: 2.9 % (ref 0.0–7.0)
Eosinophils Absolute: 0.1 10*3/uL (ref 0.0–0.5)
HCT: 35.1 % (ref 34.8–46.6)
HEMOGLOBIN: 12.5 g/dL (ref 11.6–15.9)
LYMPH#: 0.5 10*3/uL — AB (ref 0.9–3.3)
LYMPH%: 20 % (ref 14.0–49.7)
MCH: 37.3 pg — ABNORMAL HIGH (ref 25.1–34.0)
MCHC: 35.6 g/dL (ref 31.5–36.0)
MCV: 104.8 fL — ABNORMAL HIGH (ref 79.5–101.0)
MONO#: 0.2 10*3/uL (ref 0.1–0.9)
MONO%: 8.3 % (ref 0.0–14.0)
NEUT%: 65 % (ref 38.4–76.8)
NEUTROS ABS: 1.6 10*3/uL (ref 1.5–6.5)
NRBC: 0 % (ref 0–0)
Platelets: 175 10*3/uL (ref 145–400)
RBC: 3.35 10*6/uL — AB (ref 3.70–5.45)
RDW: 13.3 % (ref 11.2–14.5)
WBC: 2.4 10*3/uL — AB (ref 3.9–10.3)

## 2016-03-17 NOTE — Therapy (Signed)
[place], [place], [address] Phone: [phone]   Fax:  [phone]  Physical Therapy Treatment  Patient Details  Name: [name] [name] MRN: [id number] Date of Birth: 1960/11/18 Referring Provider: Dr. Kyung Rudd  Encounter Date: 03/17/2016      PT End of Session - 03/17/16 1423    Visit Number [id number] for lymph   Number of Visits 100  lymph   Date for PT Re-Evaluation 03/30/16  lymph   Authorization - Visit Number [id number]   Authorization - Number of Visits [id number]   PT Start Time 1019   PT Stop Time 1101   PT Time Calculation (min) 42 min   Activity Tolerance Patient tolerated treatment well   Behavior During Therapy [facility] for tasks assessed/performed      Past Medical History:  Diagnosis Date  . Bone metastases ([place]) dx'd 05/2014  . Breast cancer ([place]) dx'd 2005/2011  . Peripheral vascular disease ([place]) 02/2010   blood clot related to porta cath  . PONV (postoperative nausea and vomiting)   . S/P radiation therapy 07/17/2014 through 08/02/2014    Left mediastinum, left seventh rib 3250 cGy in 13 sessions   . S/P radiation therapy 12/11/2014 through 12/22/2014    Left parietal calvarium 2400 cGy in 8 sessions   . Seizures ([place]) 2010   Isolated incident.    Past Surgical History:  Procedure Laterality Date  . AXILLARY LYMPH NODE DISSECTION  Dec. 2011  . BREAST LUMPECTOMY  2005  . MEDIASTINOTOMY CHAMBERLAIN MCNEIL Left 06/02/2013   Procedure: MEDIASTINOTOMY CHAMBERLAIN MCNEIL;  Surgeon: [name], MD;  Location: [facility];  Service: Thoracic;  Laterality: Left;  LEFT ANTERIOR MEDIASTINOTOMY   . PORTACATH PLACEMENT  12/11  . removal portacath      There were no vitals filed for this visit.      Subjective Assessment - 03/17/16  1020    Subjective Was in more pain for several days after the pelvic therapy last week.   Currently in Pain? Yes   Pain Score 6    Pain Location Axilla   Pain Orientation Right   Pain Descriptors / Indicators Other (Comment)  fullness   Aggravating Factors  more swelling   Pain Relieving Factors therapy   Multiple Pain Sites Yes   Pain Score 2   Pain Location Leg   Pain Orientation Right   Pain Descriptors / Indicators Other (Comment)  "yapping at me"   Aggravating Factors  recent pelvic therapy   Pain Relieving Factors manual therapy, myofascial release                         [facility] Adult PT Treatment/Exercise - 03/17/16 0001      Manual Therapy   Myofascial Release right axilla with focus on scar tightness   Manual Lymphatic Drainage (MLD) In left sidelying, posterior interaxillary anastomosis and right axillo-inguinal anastomosis; in supine, short neck, left axilla and anterior interaxillary anastomosis, right groin and axillo-inguinal anastomosis, and right upper extremity from fingers to shoulder.  In right sidelying, left periscapular area toward left groin.   Other Manual Therapy soft tissue work at superior aspect of right medial and upper thigh in prone position for pain relief and muscle relaxation, gentle myofascial release at upper thigh, with releases today from far medial to lateral aspects of posterior thigh  PT Short Term Goals - 02/12/16 1226      PT SHORT TERM GOAL #1   Title pain with walking decreased >/= 25%   Time 4   Period Weeks   Status Achieved           PT Long Term Goals - 03/11/16 1308      PT LONG TERM GOAL #1   Title indpendent with HEP   Time 8   Period Weeks   Status On-going     PT LONG TERM GOAL #2   Title pain with walking decreased >/= 75%   Time 8   Period Weeks   Status On-going  60% better     PT LONG TERM GOAL #3   Title ability to flex her right hip with right groin pain  decreased >/= 50% due to improved tissue mobility   Time 8   Period Weeks   Status Achieved  50% better     PT LONG TERM GOAL #4   Title waking up in middle of night with pain decreased >/=50%   Time 8   Period Weeks   Status New     PT LONG TERM GOAL #5   Title reduction of pain by end of day >/= 50% due to increase tissue mobility   Time 8   Period Weeks   Status New           Long Term Clinic Goals - 03/07/16 1121      CC Long Term Goal  #2   Title Pt. will report swelling is adequately managed to enable ADL function at a consistent level.   Status On-going     CC Long Term Goal  #4   Title Pain/discomfort at right axilla area will be controlled at 6/10 or less.   Status On-going     CC Long Term Goal  #5   Title Patient will avoid infection by ongoing management of her lymphedema at right breast/axilla/upper arm areas.   Status On-going     CC Long Term Goal  #6   Title Pain in right groin/ischial tuberosity area will be controlled at 3/10 level or less to enable walking with less pain.   Status On-going            Plan - 03/17/16 1424    Rehab Potential Good   Clinical Impairments Affecting Rehab Potential active cancer   PT Frequency 2x / week   PT Duration 4 weeks   PT Treatment/Interventions Therapeutic activities;Therapeutic exercise;Patient/family education;Manual techniques;Manual lymph drainage   PT Next Visit Plan manual lymph drainage, soft tissue work, myofascial release   Consulted and Agree with Plan of Care Patient   PT Plan right upper outer breast, chest and axilla swelling appears increased today, though fairly soft; right hand swelling is mild today, as patient demonstrates by being able to put her ring on that hand, and that it goes on more easily at end of session than at beginning      Patient will benefit from skilled therapeutic intervention in order to improve the following deficits and impairments:  Increased edema, Increased  fascial restricitons, Pain  Visit Diagnosis: Lymphedema, not elsewhere classified  Pain in right thigh     Problem List Patient Active Problem List   Diagnosis Date Noted  . Liver metastases ([place]) 02/23/2016  . 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 Zoster 04/04/2015  . Nausea with vomiting 11/18/2014  . Constipation 11/18/2014  . Left-sided thoracic back pain   .  Bone metastases ([place]) 11/16/2014  . Back pain 11/15/2014  . Uncontrolled pain 11/14/2014  . Post-lymphadenectomy lymphedema of arm 05/31/2014  . Chest wall pain 03/21/2014  . Abnormal LFTs (liver function tests) 09/12/2013  . Malignant neoplasm of upper-inner quadrant of right breast in female, estrogen receptor positive ([place]) 08/18/2013  . 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

## 2016-03-18 ENCOUNTER — Ambulatory Visit (HOSPITAL_BASED_OUTPATIENT_CLINIC_OR_DEPARTMENT_OTHER): Payer: 59 | Admitting: Oncology

## 2016-03-18 ENCOUNTER — Telehealth: Payer: Self-pay | Admitting: Oncology

## 2016-03-18 VITALS — BP 145/87 | HR 89 | Resp 18

## 2016-03-18 DIAGNOSIS — I890 Lymphedema, not elsewhere classified: Secondary | ICD-10-CM | POA: Diagnosis not present

## 2016-03-18 DIAGNOSIS — C787 Secondary malignant neoplasm of liver and intrahepatic bile duct: Secondary | ICD-10-CM | POA: Diagnosis not present

## 2016-03-18 DIAGNOSIS — C50211 Malignant neoplasm of upper-inner quadrant of right female breast: Secondary | ICD-10-CM

## 2016-03-18 DIAGNOSIS — M8580 Other specified disorders of bone density and structure, unspecified site: Secondary | ICD-10-CM

## 2016-03-18 DIAGNOSIS — Z86718 Personal history of other venous thrombosis and embolism: Secondary | ICD-10-CM

## 2016-03-18 DIAGNOSIS — Z170 Estrogen receptor positive status [ER+]: Secondary | ICD-10-CM

## 2016-03-18 DIAGNOSIS — C771 Secondary and unspecified malignant neoplasm of intrathoracic lymph nodes: Secondary | ICD-10-CM | POA: Diagnosis not present

## 2016-03-18 NOTE — Progress Notes (Signed)
[facility]  [phone] [phone]) [phone]     ID: [name] [name] OB: 05-26-1960  MR#: [id number]  FBP#:[id number]  PCP: Pcp Not In System GYN:  [name] [name] SU:  OTHER MD: [name] [name], [name] [name], [name] [name], [name] [name], [name] [name], [name] [name]   CHIEF COMPLAINT: Stage IV breast cancer  CURRENT TREATMENT: Fulvestrant, denosumab, palbociclib  INTERVAL HISTORY:   [name] [name] returns today for further follow-up of her estrogen receptor positive breast cancer accompanied by her husband [name] [name]. She continues on monthly fulvestrant and denosumab/Xgeva, with her most recent dose 03/11/2016. Recall she had stereotactic radiosurgery to her T11 lesion on 03/12/2016. She is also continuing to receive frequent massage therapy through physical therapy.  At her most recent visit here we upped her palbociclib dose to 100 mg daily. While she has not had any new symptoms regarding that, she has had worse symptoms of the same type as before. For example she does not feel well. She has more fatigue. She gets "black spots" in her vision, has happened several times usually in the off week at the lower dose, but now seems to be a little bit more frequent. This clears with Aleve, which however she is very reluctant to take. She has a "floating feeling", almost like "I'm out of my body", but there have been no falls or fainting. She has found it hard to focus at times and loses words she says. She's had some epistaxis. On the plus side, since she had a radiation of the right scapular pain has greatly decreased. She is backing off of some of the physical therapy because sometimes it seems to make things worse. She is making sure to hydrate herself aggressively.    REVIEW OF SYiSTEMS: Aside from the problem stated above in detailed review of systems today was stable  BREAST CANCER HISTORY: From doctor [name] [name]'s intake note 03/20/2004:  "The patient  is a very pleasant 55 year old female, without significant past medical history.  Her family history is significant for a sister who at age 48 was diagnosed with invasive ductal carcinoma.  She is a breast cancer survivor at age 50 now.  The patient states that she has never really had a screening mammogram until October 2005, when she felt that it was time for her to start having mammograms done on a yearly basis.  Therefore, on 01/26/04, she underwent a screening mammogram and an abnormality was detected in the upper outer right breast.  She, therefore, underwent spot compression views of both the right and the left breast.  The left breast revealed a well-defined mass in the upper outer left quadrant, present at the 2 o'clock position, measuring 1.8 cm, 6 cm from the nipple.  This, by ultrasound, was felt to be a simple cyst measuring 1.8 cm.  On the right breast, a spiculated mass was noted in the upper outer right quadrant.  The ultrasound revealed a shadowing irregular solid mass at the 10:30 position, 9 cm from the nipple, measuring 1.2 cm in greatest dimension, correlating with the spiculated mass seen on the mammogram.  The right axilla was negative ultrasonically.  Because of this, the patient underwent a needle biopsy of the right breast and the biopsy was positive invasive mammary carcinoma that showed features consistent with a high-grade invasive ductal carcinoma associated with desmoplastic stroma.  No in situ component was seen and no definite lymphovascular invasion was identified.  On the core biopsy, the tumor measured about 0.8  cm.  Because of this, she was seen by Dr. [name] and the patient was taken to the [facility] March 15, 2004.  She underwent a right breast lumpectomy with sentinel node biopsy.  The final pathology revealed an invasive ductal carcinoma, measuring 1.7 cm, grade 2 of 3.  Margins were free of tumor.  Atypical lobular hyperplasia was noted.  One sentinel node was  removed which was negative for metastatic disease.  The tumor was staged at T1c, N0 MX.  It was estrogen receptor positive, progesterone receptor positive.  HER-2/neu was 2+.  FISH was negative.  All margins were free of tumor.  She is now seen in Medical Oncology for further evaluation and management of this newly diagnosed T1c, node negative, stage I, invasive ductal carcinoma of the right breast."  Her subsequent history is as detailed below   PAST MEDICAL HISTORY: Past Medical History:  Diagnosis Date  . Bone metastases ([facility]) dx'd 05/2014  . Breast cancer ([facility]) dx'd 2005/2011  . Peripheral vascular disease ([facility]) 02/2010   blood clot related to porta cath  . PONV (postoperative nausea and vomiting)   . S/P radiation therapy 07/17/2014 through 08/02/2014    Left mediastinum, left seventh rib 3250 cGy in 13 sessions   . S/P radiation therapy 12/11/2014 through 12/22/2014    Left parietal calvarium 2400 cGy in 8 sessions   . Seizures ([facility]) 2010   Isolated incident.    PAST SURGICAL HISTORY: Past Surgical History:  Procedure Laterality Date  . AXILLARY LYMPH NODE DISSECTION  Dec. 2011  . BREAST LUMPECTOMY  2005  . MEDIASTINOTOMY CHAMBERLAIN MCNEIL Left 06/02/2013   Procedure: MEDIASTINOTOMY CHAMBERLAIN MCNEIL;  Surgeon: [name], MD;  Location: [facility];  Service: Thoracic;  Laterality: Left;  LEFT ANTERIOR MEDIASTINOTOMY   . PORTACATH PLACEMENT  12/11  . removal portacath      FAMILY HISTORY Family History  Problem Relation Age of Onset  . COPD Mother   . Breast cancer Sister 34   The patient's father is living, 80 years old as of may 2015. He lives in [place]. The patient's mother died from complications of COPD at the age of 42. These has 2 brothers, one sister. Her sister developed breast cancer at the age of 20. She  is doing well. The patient herself underwent genetic testing at [facility] in 2011 and was found to be BRCA negative  GYNECOLOGIC HISTORY:  Menarche age 73, she is GX P0. She stopped having periods with her initial chemotherapy in 2006.  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 Her husband [name] [name] is a Medical illustrator in [place]. He has a child from a prior marriage. At home they have 2 rescue dogs, Hobo and [place]. The patient is religious but not a church attender    ADVANCED DIRECTIVES: In place; at the 08/04/2014 visit in particular the patient was very clear, with her husband present, that she would not want any kind of feeding tubes or "other tubes" if her condition deteriorated.   HEALTH MAINTENANCE: Social History  Substance Use Topics  . Smoking status: Never Smoker  . Smokeless tobacco: Never Used  . Alcohol use No     Colonoscopy:  PAP:  Bone density: March 2015; mild osteopenia  Lipid panel:  Allergies  Allergen Reactions  . 2nd Skin Quick Heal Other (See Comments)    Other Reaction: Skin peels  . Decadron [Dexamethasone]  Other (See Comments)    Patient does not tolerate steroids.   . Dilaudid [Hydromorphone] Nausea And Vomiting  . Enoxaparin Other (See Comments)    unknown  . Fluconazole Swelling    Liver toxicity  . Hydromorphone Hcl Nausea And Vomiting  . Morphine And Related Nausea And Vomiting  . Protonix [Pantoprazole Sodium] Other (See Comments)    Patient reports it caused thrush.  . Tegaderm Ag Mesh [Silver]     Current Outpatient Prescriptions  Medication Sig Dispense Refill  . ALPRAZolam (XANAX) 0.5 MG tablet Take 1 tablet (0.5 mg total) by mouth 2 (two) times daily as needed for anxiety. 30 tablet 0  . B Complex-C (B-COMPLEX WITH VITAMIN C) tablet Take 1 tablet by mouth daily. Reported on 03/27/2015    . calcium carbonate (TUMS - DOSED IN MG ELEMENTAL CALCIUM) 500 MG chewable  tablet Chew 1 tablet by mouth as directed.    . cholecalciferol 2000 UNITS tablet Take 1 tablet (2,000 Units total) by mouth daily.    . ciprofloxacin (CIPRO) 500 MG tablet Take 1 tablet (500 mg total) by mouth 2 (two) times daily. 6 tablet 0  . Diphenhyd-Hydrocort-Nystatin (FIRST-DUKES MOUTHWASH) SUSP 5-10 ml qid SWISH AND SPIT 161 mL 3  . folic acid (FOLVITE) 1 MG tablet Take 1 tablet (1 mg total) by mouth daily.    Marland Kitchen loratadine-pseudoephedrine (CLARITIN-D 24-HOUR) 10-240 MG 24 hr tablet Take 1 tablet by mouth daily.    . Melatonin 3 MG TABS Take 3 mg by mouth at bedtime.    . methylPREDNISolone (MEDROL) 4 MG tablet Take 1 tablet (4m) x3 per day for 1 day, then 1 tablet x2 per day for 1 day, then 1 tablet per day for 1 day, then discontinue. 6 tablet 0  . naproxen sodium (ANAPROX) 220 MG tablet Take 220 mg by mouth 2 (two) times daily with a meal.    . palbociclib (IBRANCE) 100 MG capsule Take 1 capsule (100 mg total) by mouth daily with breakfast. Take whole with food. 21 capsule 6  . phenazopyridine (PYRIDIUM) 200 MG tablet Take 1 tablet (200 mg total) by mouth 3 (three) times daily as needed for pain. 30 tablet 1  . RABEprazole Sodium 5 MG CPSP Take 5 mg by mouth daily. 30 capsule 3  . saccharomyces boulardii (FLORASTOR) 250 MG capsule Take 250 mg by mouth daily.      No current facility-administered medications for this visit.     OBJECTIVE: Middle-aged white woman who appears stated age  V27   03/18/16 1212  BP: (!) 145/87  Pulse: 89  Resp: 18     There is no height or weight on file to calculate BMI.       ECOG FS: 1  Sclerae unicteric, EOMs intact Oropharynx clear and moist No cervical or supraclavicular adenopathy Lungs no rales or rhonchi Heart regular rate and rhythm Abd soft, nontender, positive bowel sounds MSK no focal spinal tenderness, no upper extremity lymphedema Neuro: nonfocal, well oriented, appropriate affect Breasts: Deferred   LAB  RESULTS:   CMP     Component Value Date/Time   NA 137 03/10/2016 1143   K 4.4 03/10/2016 1143   CL 103 12/14/2014 0800   CL 105 05/06/2012 1333   CO2 26 03/10/2016 1143   GLUCOSE 89 03/10/2016 1143   GLUCOSE 124 (H) 05/06/2012 1333   BUN 10.5 03/10/2016 1143   CREATININE 0.9 03/10/2016 1143   CALCIUM 10.1 03/10/2016 1143   PROT 7.5 03/10/2016 1143  ALBUMIN 3.9 03/10/2016 1143   AST 37 (H) 03/10/2016 1143   ALT 44 03/10/2016 1143   ALKPHOS 87 03/10/2016 1143   BILITOT 0.40 03/10/2016 1143   GFRNONAA >60 12/14/2014 0800   GFRAA >60 12/14/2014 0800    No results found for: SPEP  Lab Results  Component Value Date   WBC 2.4 (L) 03/17/2016   NEUTROABS 1.6 03/17/2016   HGB 12.5 03/17/2016   HCT 35.1 03/17/2016   MCV 104.8 (H) 03/17/2016   PLT 175 03/17/2016      Chemistry      Component Value Date/Time   NA 137 03/10/2016 1143   K 4.4 03/10/2016 1143   CL 103 12/14/2014 0800   CL 105 05/06/2012 1333   CO2 26 03/10/2016 1143   BUN 10.5 03/10/2016 1143   CREATININE 0.9 03/10/2016 1143      Component Value Date/Time   CALCIUM 10.1 03/10/2016 1143   ALKPHOS 87 03/10/2016 1143   AST 37 (H) 03/10/2016 1143   ALT 44 03/10/2016 1143   BILITOT 0.40 03/10/2016 1143     No results for input(s): INR in the last 168 hours.  Urinalysis    Component Value Date/Time   COLORURINE YELLOW 11/17/2014 0143   APPEARANCEUR CLOUDY (A) 11/17/2014 0143   LABSPEC 1.005 01/02/2016 1127   PHURINE 7.0 01/02/2016 1127   PHURINE 6.5 11/17/2014 0143   GLUCOSEU Negative 01/02/2016 1127   HGBUR Trace 01/02/2016 1127   HGBUR NEGATIVE 11/17/2014 0143   BILIRUBINUR Negative 01/02/2016 1127   KETONESUR Negative 01/02/2016 1127   KETONESUR NEGATIVE 11/17/2014 0143   PROTEINUR Negative 01/02/2016 1127   PROTEINUR NEGATIVE 11/17/2014 0143   UROBILINOGEN 0.2 01/02/2016 1127   NITRITE Negative 01/02/2016 1127   NITRITE NEGATIVE 11/17/2014 0143   LEUKOCYTESUR Trace 01/02/2016 1127    Results for SONJI, STARKES (MRN [id number]) as of 03/18/2016 17:38  Ref. Range 12/17/2015 11:30 01/14/2016 11:47 01/30/2016 11:55 02/11/2016 11:52 03/10/2016 11:43  CA 27.29 Latest Ref Range: 0.0 - 38.6 U/mL 1,754.1 (H) 2,432.2 (H) 2,042.4 (H) 2,463.9 (H) 2,652.0 (H)     STUDIES: Mr Thoracic Spine W Wo Contrast  Result Date: 03/04/2016 CLINICAL DATA:  55 year old female with metastatic breast cancer. Stereotactic radiosurgery planning for T11 vertebral body metastasis. Subsequent encounter. EXAM: MRI THORACIC SPINE WITHOUT AND WITH CONTRAST TECHNIQUE: Multiplanar and multiecho pulse sequences of the thoracic and lumbar spine were obtained without and with intravenous contrast. CONTRAST:  66m MULTIHANCE GADOBENATE DIMEGLUMINE 529 MG/ML IV SOLN COMPARISON:  PET-CT 02/22/2016. Thoracic spine MRI 02/14/2016, and earlier. FINDINGS: Same numbering system used on the 02/14/2016 study. Alignment: Stable thoracic vertebral height and alignment. No pathologic fracture. Vertebrae: In general there is normal T1 hyperintense bone marrow signal throughout the visible spine. Marrow signal is more heterogeneous in the visible upper lumbar levels. This might reflect previous radiation treatment to the thorax. No marrow edema except associated with the T2 and T11 metastases. The T2 metastasis is visible on sagittal images and appears stable since 02/14/2016 measuring about 12 mm. The T11 lesion was evaluated with sagittal and thin slice axial images today. It measures 16 x 11 x 17 mm (AP by transverse by CC) and is stable in size and configuration since 02/14/2016. The inferior most aspect of the anterior right T11 pedicle is involved as before (series 4, image 4). No other posterior element involvement. The lesion heterogeneously enhances, and extends to the medial cortex of the vertebral body at the level of the spinal canal but there is  no evidence of epidural or paraspinal soft tissue extension (series 10 image  28). No new lesion in the thoracic spine. Cord: Capacious thoracic spinal canal. Spinal cord signal is within normal limits at all visualized levels. The conus medullaris appears normal at L1. Paraspinal and other soft tissues: Stable from the recent PET-CT. IMPRESSION: 1. Stereotactic treatment planning for T11 vertebral body metastasis. 2. Stable T11 metastasis since the MRI on 02/14/2016. Involvement of the anterior inferior right T11 pedicle and the medial vertebral cortex without epidural or paraspinal extension. 3. Stable chronic T2 metastasis. No new osseous abnormality identified in the thoracic spine. Electronically Signed   By: Genevie Ann M.D.   On: 03/04/2016 12:27   Nm Pet Image Restag (ps) Skull Base To Thigh  Result Date: 02/25/2016 CLINICAL DATA:  Subsequent treatment strategy for right-sided breast cancer with metastasis to bones, left pleural space, liver, and nodal stations. EXAM: NUCLEAR MEDICINE PET SKULL BASE TO THIGH TECHNIQUE: 8.0 mCi F-18 FDG was injected intravenously. Full-ring PET imaging was performed from the skull base to thigh after the radiotracer. CT data was obtained and used for attenuation correction and anatomic localization. FASTING BLOOD GLUCOSE:  Value: 88 Mg/dl COMPARISON:  PET of 11/26/2015.  Abdominal MRI of 02/14/2016. FINDINGS: NECK No areas of abnormal hypermetabolism. No cervical adenopathy. Lucent lesions within the calvarium are again identified and not hypermetabolic. A left parietal lesion measures 1.8 cm on image 9/series 4 versus 1.9 cm on the prior. CHEST Prevascular node measures 11 mm and a S.U.V. max of 10.9 on image 82/ series 4. Compare 9 mm and a S.U.V. max of 5.2 on the prior exam. Left subpectoral node measures 6 mm and a S.U.V. max of 2.0 on image 82/ series 4. Compare 7 mm and a S.U.V. max of 2.4 on the prior exam. Left internal mammary node measures 8 mm and a S.U.V. max of 5.9 on image 88/ series 4. Compare 6 mm and a S.U.V. max of 4.7 on the prior.  Left-sided mediastinal hypermetabolism corresponding to probable nodal tissue. This measures 8 mm and a S.U.V. max of 6.5 on image 83/series 4. Compare similar in size and a S.U.V. max of 4.4 on the prior. A focus of hypermetabolic left pleural nodularity measures 12 mm and a S.U.V. max of 8.3 on image 105/series 4. Compare similar in size and a S.U.V. max of 6.3 on the prior. Similar appearance of radiation fibrosis in the left apex and left upper lobe. Volume loss with rounded atelectasis in the left lower lobe. Right axillary node dissection. ABDOMEN/PELVIS Hepatic metastasis. Index lesion in the anterior right hepatic lobe measures 4.7 cm and a S.U.V. max of 13.1 on image 118/series 4. On the prior PET, 4.1 cm and a S.U.V. max of 7.4. Similar in size to on the prior MRI, given cross modality comparison. Central right hepatic lobe lesion measures 2.4 cm and a S.U.V. max of 7.5 on image 118/series 4. Compare 1.9 cm and a S.U.V. max of 5.2 on the prior. Aortocaval node measures 8 mm and a S.U.V. max of 6.3 versus 9 mm and a S.U.V. max of 5.5. Left periaortic node measures 9 mm and a S.U.V. max of 3.5 on image 130/series 4. Compare 1.0 cm and a S.U.V. max of 4.5 on the prior. Abdominal aortic atherosclerosis. Mild pelvic floor laxity. No bowel obstruction. SKELETON A right-sided T11 lesion measures a S.U.V. max of 6.2 today versus a S.U.V. max of 11.5 on the prior exam. Vague hypermetabolism surrounding the  right inferior pubic ramus measures a S.U.V. max of 4.3 today versus a S.U.V. max of 3.4 on the prior. IMPRESSION: 1. Since the prior PET of 11/26/2015, progression of nodal metastasis within the chest. 2. Mild progression of hepatic metastasis. 3. Similar to slight progression of left-sided pleural metastasis. 4. Slight decrease in hypermetabolism involving abdominal lymph nodes. 5. Slight improvement in osseous metastasis. Vague hypermetabolism surrounding the right ischial tuberosity is slightly increased but  could be due to interval radiation therapy. Electronically Signed   By: Abigail Miyamoto M.D.   On: 02/25/2016 12:09    ASSESSMENT: 55 y.o. BRCA negative [place] woman with stage IV breast cancer, history as follows  (1)  S/p Right upper inner quadrant lumpectomy and sentinel lymph node sampling 03/15/2004 for a pT1c pN0. Stage IA invasive ductal carcinoma, grade 2, estrogen receptor 95% positive, progesterone receptor 65% positive, HER-2 not amplified; additional surgery 04/25/2004 for seroma or clearance showed no residual tumor  (2) adjuvant chemotherapy with cyclophosphamide and doxorubicin every 21 days x4 completed 07/19/2004  (3) adjuvant radiation given under Dr. Donella Stade in [place] completed July 2006  (4) the patient opted against adjuvant antiestrogen therapy  (5) genetics testing showed no BRCA mutations  (6) biopsy of a palpable right axillary mass 10/24/2009 showed invasive ductal carcinoma, grade 3, estrogen receptor 100% positive, progesterone receptor 2% positive (alert score 5) HER-2 negative; no evidence of systemic disease on PET scanning  (7) completed 3 of 4 planned cycles of docetaxel and cyclophosphamide September 2011, fourth cycle omitted because of marked elevations in liver function tests  (8) an right axillary lymph node dissection 03/06/2010 showed 3/8 lymph nodes removed to be involved by tumor, with extracapsular extension.  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, TTS-1 negative), estrogen receptor 80% positive, progesterone receptor 2% positive, HER-2 not  amplified  (12) letrozole started March 2015-- tolerated with significant side effects, discontinued at the end of May 2015  (13) PET scan 08/16/2013 shows extensive left pleural metastatic disease and a large left pleural effusion that shifts cardiac and mediastinal structures to the right; adenopathy (celiac trunk, periadrenal, periaortic); and a left medial clavicular lesion; Status post left thoracentesis 08/16/2013 positive for adenocarcinoma, estrogen receptor positive, progesterone receptor negative.  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; everolimus was suggested as an available targeted agent  (18) exemestane started 03/31/2014, discontinued 10/31/2014 with evidence of progression  (a) everolimus added 04/03/2014 but not tolerated (cytopenias, elevated LFTs) even at minimal doses; stopped 04/17/2014  (19) fulvestrant started 12/20/2014  (a) palbociclib added at very low dose 04/03/2015 (starting dose 75 mg weekly)  (b) palbociclib dose gradually increased to 75 mg daily, 21/7, as of May 2017  (c)  palbociclib dose increased to 100 mg daily, 21/ 7, beginning November mid-  cycle  (20) liver biopsy 03/20/2015 confirms metastatic carcinoma, still estrogen receptor positive at 100%, progesterone receptor negative, HER-2 equivocal with a signals ratio 1.41, number per cell 4.50.   (a) repeat liver biopsy December 2017 might show further changes (HER-2 positivity)  (21) immunohistochemistry for mismatched repair protein  mutations 03/20/2015 showed normal major and minor MMR proteins, with a very low probability of microsatellite instability ([id number])  (22) adjuvant radiation 12/24/15-01/02/16 Site/dose:   1) Left T9 Rib / 24 Gy in 8 fx                         2) Right inferior pelvis/ 24 Gy in 8 fx  (23) consider pembrolizumab starting 02/29/2016 (obtained compassionate release from company)    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 treated with Lovenox for 5-6 months  (c) right upper extremity lymphedema--receiving physical therapy  (d) hepatic steatosis with chronically elevated LFTs as well as unusual hepatic sensitivity to chemotherapy  (e) osteopenia with the lowest T score -1.6 on bone density scan 06/20/2013  (i) on denosumab/ Xgeva Q28d  (f) radiation oncology (Dr Valere Dross) has reviewed prior radiation records in case there is further mediastinal involvement with dysphagia etc in which case palliative XRT could be considered  (a) radiation to left mediastinum/ left 7th rib 3250 cGy in 13 sessions04/18/2016 through 08/02/2014  (b) radiation to T11 area: 22 Gy in 7 sessions, last dose 11/27/2014  (c) radiation left parietal scalp region to be completed 12/22/2014  (d) radiation to sacral area completed 04/09/2015  (e) radiation to right inferior pelvis and left ninth rib (24 gray, 12/24/2015--01/02/2016)  (f) T11 was treated stereotactically with 14 gray in 1 fraction 03/12/2016.  (g) chest wall and perineal pain--improved post radiation treatments  (a) discussed celebrex/ carafate but holding off for now  (h) zoster diagnosed 04/04/2015-- on valacyclovir   PLAN: We spent approximately 30 minutes going over the situation and please has very concerned because her repeat most recent CA-27-29 is the highest that we have had so far. However there is really no clear  pattern there and what I am encouraged by is the fact that she really has had no new or surprising symptoms from the higher dose of palbociclib. I think she is going to be able to tolerate.  We are going to continue to follow her lab work on him weekly basis and if her ANC drops below 1.0 we will hold the palbociclib. We will not start the next cycle until her counts covered to at least an [place] of 1.5. She is due to start the next cycle December 24 and though I suggested she waited until after Christmas she is very motivated and would like to start as scheduled.  She is going to see me again on 04/16/2016, after she has another reading on the CA-27-29 and has followed up with Dr. Lisbeth Renshaw. Other options to consider our adding the pembrolizumab to the current treatment, or even consider surgical intervention for the liver lesions, but what I really would prefer to do is try the higher dose of palbociclib for at least 2 more cycles and then restage.  She knows to call for any problems develop before her next visit.  Chauncey Cruel, MD   03/18/2016 5:37 PM

## 2016-03-18 NOTE — Telephone Encounter (Signed)
Appointments scheduled per 12/19 LOS. Patient did not wish to have anything printed out. She will check her MyChart account.

## 2016-03-21 ENCOUNTER — Ambulatory Visit: Payer: 59 | Admitting: Physical Therapy

## 2016-03-21 ENCOUNTER — Encounter: Payer: 59 | Admitting: Physical Therapy

## 2016-03-21 DIAGNOSIS — M79651 Pain in right thigh: Secondary | ICD-10-CM

## 2016-03-21 DIAGNOSIS — I890 Lymphedema, not elsewhere classified: Secondary | ICD-10-CM

## 2016-03-21 NOTE — Therapy (Signed)
[place], [place], [address] Phone: [phone]   Fax:  [phone]  Physical Therapy Treatment  Patient Details  Name: [name] [name] MRN: [id number] Date of Birth: Aug 03, 1960 Referring Provider: Dr. Kyung Rudd  Encounter Date: 03/21/2016      PT End of Session - 03/21/16 1204    Visit Number [id number] for lymph   Number of Visits 100  for lymph   Date for PT Re-Evaluation 03/30/16   Authorization - Visit Number 140   PT Start Time [id number]   PT Stop Time 1106   PT Time Calculation (min) 43 min   Activity Tolerance Patient tolerated treatment well   Behavior During Therapy [facility] for tasks assessed/performed      Past Medical History:  Diagnosis Date  . Bone metastases ([place]) dx'd 05/2014  . Breast cancer ([place]) dx'd 2005/2011  . Peripheral vascular disease ([place]) 02/2010   blood clot related to porta cath  . PONV (postoperative nausea and vomiting)   . S/P radiation therapy 07/17/2014 through 08/02/2014    Left mediastinum, left seventh rib 3250 cGy in 13 sessions   . S/P radiation therapy 12/11/2014 through 12/22/2014    Left parietal calvarium 2400 cGy in 8 sessions   . Seizures ([place]) 2010   Isolated incident.    Past Surgical History:  Procedure Laterality Date  . AXILLARY LYMPH NODE DISSECTION  Dec. 2011  . BREAST LUMPECTOMY  2005  . MEDIASTINOTOMY CHAMBERLAIN MCNEIL Left 06/02/2013   Procedure: MEDIASTINOTOMY CHAMBERLAIN MCNEIL;  Surgeon: [name], MD;  Location: [place];  Service: Thoracic;  Laterality: Left;  LEFT ANTERIOR MEDIASTINOTOMY   . PORTACATH PLACEMENT  12/11  . removal portacath      There were no vitals filed for this visit.      Subjective Assessment - 03/21/16 1024    Subjective "I'd like you to go across  my back.  Yesterday I had a lot of tightness in my back.   Currently in Pain? Yes   Pain Score 6    Pain Location Axilla   Pain Orientation Right   Pain Descriptors / Indicators Tightness   Pain Score 2   Pain Location Leg   Pain Orientation Right   Pain Descriptors / Indicators Tightness                         [facility] Adult PT Treatment/Exercise - 03/21/16 0001      Manual Therapy   Myofascial Release right axilla with focus on scar tightness   Manual Lymphatic Drainage (MLD) In left sidelying, posterior interaxillary anastomosis and right axillo-inguinal anastomosis; in supine, short neck, left axilla and anterior interaxillary anastomosis, right groin and axillo-inguinal anastomosis, and right upper extremity from fingers to shoulder.  In right sidelying, left periscapular area toward left groin.   Other Manual Therapy soft tissue work at superior aspect of right medial and upper thigh in prone position for pain relief and muscle relaxation, gentle myofascial release at upper thigh, with releases today from far medial to lateral aspects of posterior thigh                  PT Short Term Goals - 02/12/16 1226      PT SHORT TERM GOAL #1   Title pain with walking decreased >/= 25%   Time 4   Period Weeks   Status Achieved  PT Long Term Goals - 03/11/16 1308      PT LONG TERM GOAL #1   Title indpendent with HEP   Time 8   Period Weeks   Status On-going     PT LONG TERM GOAL #2   Title pain with walking decreased >/= 75%   Time 8   Period Weeks   Status On-going  60% better     PT LONG TERM GOAL #3   Title ability to flex her right hip with right groin pain decreased >/= 50% due to improved tissue mobility   Time 8   Period Weeks   Status Achieved  50% better     PT LONG TERM GOAL #4   Title waking up in middle of night with pain decreased >/=50%   Time 8   Period Weeks   Status New     PT LONG TERM GOAL #5   Title  reduction of pain by end of day >/= 50% due to increase tissue mobility   Time 8   Period Weeks   Status New           Long Term Clinic Goals - 03/21/16 1206      CC Long Term Goal  #2   Title Pt. will report swelling is adequately managed to enable ADL function at a consistent level.   Status On-going     CC Long Term Goal  #4   Title Pain/discomfort at right axilla area will be controlled at 6/10 or less.   Status On-going     CC Long Term Goal  #5   Title Patient will avoid infection by ongoing management of her lymphedema at right breast/axilla/upper arm areas.   Status On-going     CC Long Term Goal  #6   Title Pain in right groin/ischial tuberosity area will be controlled at 3/10 level or less to enable walking with less pain.   Status On-going            Plan - 03/21/16 1205    Clinical Impression Statement Still getting fullness at right upper outer breast; new c/o back tightness today.  Still benefits from therapy for right thigh pain.   Rehab Potential Good   Clinical Impairments Affecting Rehab Potential active cancer   PT Frequency 2x / week   PT Duration 4 weeks   PT Treatment/Interventions Therapeutic activities;Therapeutic exercise;Patient/family education;Manual techniques;Manual lymph drainage   PT Next Visit Plan manual lymph drainage, soft tissue work, myofascial release   Consulted and Agree with Plan of Care Patient      Patient will benefit from skilled therapeutic intervention in order to improve the following deficits and impairments:  Increased edema, Increased fascial restricitons, Pain  Visit Diagnosis: Lymphedema, not elsewhere classified  Pain in right thigh     Problem List Patient Active Problem List   Diagnosis Date Noted  . Liver metastases ([place]) 02/23/2016  . 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 Zoster 04/04/2015  . Nausea with vomiting 11/18/2014  . Constipation 11/18/2014  . Left-sided thoracic back pain    . Bone metastases ([place]) 11/16/2014  . Back pain 11/15/2014  . Uncontrolled pain 11/14/2014  . Post-lymphadenectomy lymphedema of arm 05/31/2014  . Chest wall pain 03/21/2014  . Abnormal LFTs (liver function tests) 09/12/2013  . Malignant neoplasm of upper-inner quadrant of right breast in female, estrogen receptor positive ([place]) 08/18/2013  . 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

## 2016-03-25 ENCOUNTER — Ambulatory Visit: Payer: 59 | Admitting: Physical Therapy

## 2016-03-25 ENCOUNTER — Encounter: Payer: 59 | Admitting: Physical Therapy

## 2016-03-25 ENCOUNTER — Other Ambulatory Visit (HOSPITAL_BASED_OUTPATIENT_CLINIC_OR_DEPARTMENT_OTHER): Payer: 59

## 2016-03-25 DIAGNOSIS — C7951 Secondary malignant neoplasm of bone: Secondary | ICD-10-CM

## 2016-03-25 DIAGNOSIS — M79651 Pain in right thigh: Secondary | ICD-10-CM

## 2016-03-25 DIAGNOSIS — Z170 Estrogen receptor positive status [ER+]: Secondary | ICD-10-CM

## 2016-03-25 DIAGNOSIS — C50211 Malignant neoplasm of upper-inner quadrant of right female breast: Secondary | ICD-10-CM

## 2016-03-25 DIAGNOSIS — I890 Lymphedema, not elsewhere classified: Secondary | ICD-10-CM

## 2016-03-25 DIAGNOSIS — C787 Secondary malignant neoplasm of liver and intrahepatic bile duct: Secondary | ICD-10-CM

## 2016-03-25 DIAGNOSIS — C771 Secondary and unspecified malignant neoplasm of intrathoracic lymph nodes: Secondary | ICD-10-CM

## 2016-03-25 LAB — CBC WITH DIFFERENTIAL/PLATELET
BASO%: 1.6 % (ref 0.0–2.0)
BASOS ABS: 0 10*3/uL (ref 0.0–0.1)
EOS ABS: 0 10*3/uL (ref 0.0–0.5)
EOS%: 1.2 % (ref 0.0–7.0)
HCT: 32.1 % — ABNORMAL LOW (ref 34.8–46.6)
HEMOGLOBIN: 11.5 g/dL — AB (ref 11.6–15.9)
LYMPH#: 0.5 10*3/uL — AB (ref 0.9–3.3)
LYMPH%: 17.4 % (ref 14.0–49.7)
MCH: 37 pg — ABNORMAL HIGH (ref 25.1–34.0)
MCHC: 35.8 g/dL (ref 31.5–36.0)
MCV: 103.2 fL — AB (ref 79.5–101.0)
MONO#: 0.5 10*3/uL (ref 0.1–0.9)
MONO%: 19.4 % — ABNORMAL HIGH (ref 0.0–14.0)
NEUT%: 60.4 % (ref 38.4–76.8)
NEUTROS ABS: 1.6 10*3/uL (ref 1.5–6.5)
NRBC: 0 % (ref 0–0)
PLATELETS: 203 10*3/uL (ref 145–400)
RBC: 3.11 10*6/uL — ABNORMAL LOW (ref 3.70–5.45)
RDW: 13.5 % (ref 11.2–14.5)
WBC: 2.6 10*3/uL — ABNORMAL LOW (ref 3.9–10.3)

## 2016-03-25 NOTE — Therapy (Signed)
[place], [place], [address] Phone: [phone]   Fax:  [phone]  Physical Therapy Treatment  Patient Details  Name: [name] [name] MRN: [id number] Date of Birth: 01-Nov-1960 Referring Provider: Dr. Kyung Rudd  Encounter Date: 03/25/2016      PT End of Session - 03/25/16 1424    Visit Number 138  98 for lymph   Number of Visits 100  for lymph   Date for PT Re-Evaluation 03/30/16   Authorization - Visit Number 140   PT Start Time [id number]   PT Stop Time [id number]   PT Time Calculation (min) 47 min   Activity Tolerance Patient tolerated treatment well   Behavior During Therapy [facility] for tasks assessed/performed      Past Medical History:  Diagnosis Date  . Bone metastases ([place]) dx'd 05/2014  . Breast cancer ([place]) dx'd 2005/2011  . Peripheral vascular disease ([place]) 02/2010   blood clot related to porta cath  . PONV (postoperative nausea and vomiting)   . S/P radiation therapy 07/17/2014 through 08/02/2014    Left mediastinum, left seventh rib 3250 cGy in 13 sessions   . S/P radiation therapy 12/11/2014 through 12/22/2014    Left parietal calvarium 2400 cGy in 8 sessions   . Seizures ([place]) 2010   Isolated incident.    Past Surgical History:  Procedure Laterality Date  . AXILLARY LYMPH NODE DISSECTION  Dec. 2011  . BREAST LUMPECTOMY  2005  . MEDIASTINOTOMY CHAMBERLAIN MCNEIL Left 06/02/2013   Procedure: MEDIASTINOTOMY CHAMBERLAIN MCNEIL;  Surgeon: [name], MD;  Location: [place];  Service: Thoracic;  Laterality: Left;  LEFT ANTERIOR MEDIASTINOTOMY   . PORTACATH PLACEMENT  12/11  . removal portacath      There were no vitals filed for this visit.      Subjective Assessment - 03/25/16 1336    Subjective Was having a strange  feeling at her right shoulder blade area this morning.   Currently in Pain? Yes   Pain Score 4    Pain Location Axilla   Pain Orientation Right   Pain Descriptors / Indicators Tightness   Aggravating Factors  more swelling   Pain Relieving Factors therapy   Pain Score 2   Pain Location Leg   Pain Orientation Right;Upper;Posterior   Pain Descriptors / Indicators Tightness  not shooting                         [facility] Adult PT Treatment/Exercise - 03/25/16 0001      Manual Therapy   Myofascial Release right axilla with focus on scar tightness   Manual Lymphatic Drainage (MLD) In left sidelying, posterior interaxillary anastomosis and right axillo-inguinal anastomosis; in supine, short neck, left axilla and anterior interaxillary anastomosis, right groin and axillo-inguinal anastomosis, and right upper extremity from fingers to shoulder.  In right sidelying, left periscapular area toward left groin.   Other Manual Therapy soft tissue work at superior aspect of right medial and upper thigh in prone position for pain relief and muscle relaxation, gentle myofascial release at upper thigh, with releases today from far medial to lateral aspects of posterior thigh                  PT Short Term Goals - 02/12/16 1226      PT SHORT TERM GOAL #1   Title pain with walking decreased >/= 25%   Time 4   Period Weeks  Status Achieved           PT Long Term Goals - 03/11/16 1308      PT LONG TERM GOAL #1   Title indpendent with HEP   Time 8   Period Weeks   Status On-going     PT LONG TERM GOAL #2   Title pain with walking decreased >/= 75%   Time 8   Period Weeks   Status On-going  60% better     PT LONG TERM GOAL #3   Title ability to flex her right hip with right groin pain decreased >/= 50% due to improved tissue mobility   Time 8   Period Weeks   Status Achieved  50% better     PT LONG TERM GOAL #4   Title waking up in middle of night with pain  decreased >/=50%   Time 8   Period Weeks   Status New     PT LONG TERM GOAL #5   Title reduction of pain by end of day >/= 50% due to increase tissue mobility   Time 8   Period Weeks   Status New           Long Term Clinic Goals - 03/21/16 1206      CC Long Term Goal  #2   Title Pt. will report swelling is adequately managed to enable ADL function at a consistent level.   Status On-going     CC Long Term Goal  #4   Title Pain/discomfort at right axilla area will be controlled at 6/10 or less.   Status On-going     CC Long Term Goal  #5   Title Patient will avoid infection by ongoing management of her lymphedema at right breast/axilla/upper arm areas.   Status On-going     CC Long Term Goal  #6   Title Pain in right groin/ischial tuberosity area will be controlled at 3/10 level or less to enable walking with less pain.   Status On-going            Plan - 03/25/16 1425    Clinical Impression Statement Right upper outer breast, chest and axilla areas feel full although not hard today; patient reports feeling something at right scapula, like perhaps increased swelling, but this is not perceived by therapist.  Still with tightness at right upper thigh, posterolateral aspect today.   Rehab Potential Good   Clinical Impairments Affecting Rehab Potential active cancer   PT Frequency 2x / week   PT Duration 4 weeks   PT Treatment/Interventions Therapeutic activities;Therapeutic exercise;Patient/family education;Manual techniques;Manual lymph drainage   PT Next Visit Plan renewal  next visit; manual lymph drainage, soft tissue work, myofascial release   Consulted and Agree with Plan of Care Patient      Patient will benefit from skilled therapeutic intervention in order to improve the following deficits and impairments:  Increased edema, Increased fascial restricitons, Pain  Visit Diagnosis: Lymphedema, not elsewhere classified  Pain in right thigh     Problem  List Patient Active Problem List   Diagnosis Date Noted  . Liver metastases ([place]) 02/23/2016  . 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 Zoster 04/04/2015  . Nausea with vomiting 11/18/2014  . Constipation 11/18/2014  . Left-sided thoracic back pain   . Bone metastases ([place]) 11/16/2014  . Back pain 11/15/2014  . Uncontrolled pain 11/14/2014  . Post-lymphadenectomy lymphedema of arm 05/31/2014  . Chest wall pain 03/21/2014  . Abnormal LFTs (liver  function tests) 09/12/2013  . Malignant neoplasm of upper-inner quadrant of right breast in female, estrogen receptor positive ([place]) 08/18/2013  . 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

## 2016-03-27 ENCOUNTER — Encounter: Payer: 59 | Admitting: Physical Therapy

## 2016-03-28 ENCOUNTER — Ambulatory Visit: Payer: 59 | Admitting: Physical Therapy

## 2016-03-28 DIAGNOSIS — I890 Lymphedema, not elsewhere classified: Secondary | ICD-10-CM | POA: Diagnosis not present

## 2016-03-28 DIAGNOSIS — M79651 Pain in right thigh: Secondary | ICD-10-CM

## 2016-03-28 NOTE — Therapy (Signed)
[place], [place], [address] Phone: [phone]   Fax:  [phone]  Physical Therapy Treatment  Patient Details  Name: [name] [name] MRN: [id number] Date of Birth: [birth year] Referring Provider: Dr. Kyung Rudd  Encounter Date: 03/28/2016      PT End of Session - 03/28/16 1211    Visit Number [id number] for lymph   Number of Visits 100  for lymph   Date for PT Re-Evaluation 03/30/16   Authorization - Visit Number 140   PT Start Time [id number]   PT Stop Time 1106   PT Time Calculation (min) 48 min   Activity Tolerance Patient tolerated treatment well   Behavior During Therapy [facility] for tasks assessed/performed      Past Medical History:  Diagnosis Date  . Bone metastases ([place]) dx'd 05/2014  . Breast cancer ([place]) dx'd 2005/2011  . Peripheral vascular disease ([place]) 02/2010   blood clot related to porta cath  . PONV (postoperative nausea and vomiting)   . S/P radiation therapy 07/17/2014 through 08/02/2014    Left mediastinum, left seventh rib 3250 cGy in 13 sessions   . S/P radiation therapy 12/11/2014 through 12/22/2014    Left parietal calvarium 2400 cGy in 8 sessions   . Seizures ([place]) 2010   Isolated incident.    Past Surgical History:  Procedure Laterality Date  . AXILLARY LYMPH NODE DISSECTION  Dec. 2011  . BREAST LUMPECTOMY  2005  . MEDIASTINOTOMY CHAMBERLAIN MCNEIL Left 06/02/2013   Procedure: MEDIASTINOTOMY CHAMBERLAIN MCNEIL;  Surgeon: [name], MD;  Location: [place];  Service: Thoracic;  Laterality: Left;  LEFT ANTERIOR MEDIASTINOTOMY   . PORTACATH PLACEMENT  12/11  . removal portacath      There were no vitals filed for this visit.      Subjective Assessment - 03/28/16 1022    Subjective "I'm kind of tight down  here (posterior thigh), but it's not horrible." Lower back has bothered some.   Pain Score 5    Pain Location Axilla   Pain Orientation Right   Pain Descriptors / Indicators Other (Comment)  fullness   Pain Type Chronic pain   Aggravating Factors  more swelling   Pain Relieving Factors therapy   Multiple Pain Sites Yes   Pain Score 1   Pain Location Leg   Pain Orientation Right;Upper;Posterior   Pain Descriptors / Indicators Tightness   Pain Relieving Factors manual therapy, myofascial release                         [facility] Adult PT Treatment/Exercise - 03/28/16 0001      Manual Therapy   Myofascial Release right axilla with focus on scar tightness   Manual Lymphatic Drainage (MLD) In left sidelying, posterior interaxillary anastomosis and right axillo-inguinal anastomosis; in supine, short neck, left axilla and anterior interaxillary anastomosis, right groin and axillo-inguinal anastomosis, and right upper extremity from fingers to shoulder.  In right sidelying, left periscapular area toward left groin.   Other Manual Therapy soft tissue work at superior aspect of right medial and upper thigh in prone position for pain relief and muscle relaxation, gentle myofascial release at upper thigh, with releases today from far medial to lateral aspects of posterior thigh                  PT Short Term Goals - 02/12/16 1226      PT SHORT TERM GOAL #1  Title pain with walking decreased >/= 25%   Time 4   Period Weeks   Status Achieved           PT Long Term Goals - 03/11/16 1308      PT LONG TERM GOAL #1   Title indpendent with HEP   Time 8   Period Weeks   Status On-going     PT LONG TERM GOAL #2   Title pain with walking decreased >/= 75%   Time 8   Period Weeks   Status On-going  60% better     PT LONG TERM GOAL #3   Title ability to flex her right hip with right groin pain decreased >/= 50% due to improved tissue mobility   Time 8   Period  Weeks   Status Achieved  50% better     PT LONG TERM GOAL #4   Title waking up in middle of night with pain decreased >/=50%   Time 8   Period Weeks   Status New     PT LONG TERM GOAL #5   Title reduction of pain by end of day >/= 50% due to increase tissue mobility   Time 8   Period Weeks   Status New           Long Term Clinic Goals - 03/21/16 1206      CC Long Term Goal  #2   Title Pt. will report swelling is adequately managed to enable ADL function at a consistent level.   Status On-going     CC Long Term Goal  #4   Title Pain/discomfort at right axilla area will be controlled at 6/10 or less.   Status On-going     CC Long Term Goal  #5   Title Patient will avoid infection by ongoing management of her lymphedema at right breast/axilla/upper arm areas.   Status On-going     CC Long Term Goal  #6   Title Pain in right groin/ischial tuberosity area will be controlled at 3/10 level or less to enable walking with less pain.   Status On-going            Plan - 03/28/16 1212    Clinical Impression Statement Patient doing pretty well today.  Discomfort levels are down slightly in both problem areas, though having some back pain.  Swelling in right hand is very mild today and not bothering patient.  Continues to benefit from therapy for edema and pain control.   Rehab Potential Good   Clinical Impairments Affecting Rehab Potential active cancer   PT Frequency 2x / week   PT Duration 4 weeks   PT Treatment/Interventions Therapeutic activities;Therapeutic exercise;Patient/family education;Manual techniques;Manual lymph drainage   Consulted and Agree with Plan of Care Patient      Patient will benefit from skilled therapeutic intervention in order to improve the following deficits and impairments:  Increased edema, Increased fascial restricitons, Pain  Visit Diagnosis: Lymphedema, not elsewhere classified  Pain in right thigh     Problem List Patient Active  Problem List   Diagnosis Date Noted  . Liver metastases ([name]) 02/23/2016  . 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 Zoster 04/04/2015  . Nausea with vomiting 11/18/2014  . Constipation 11/18/2014  . Left-sided thoracic back pain   . Bone metastases ([place]) 11/16/2014  . Back pain 11/15/2014  . Uncontrolled pain 11/14/2014  . Post-lymphadenectomy lymphedema of arm 05/31/2014  . Chest wall pain 03/21/2014  . Abnormal LFTs (liver  function tests) 09/12/2013  . Malignant neoplasm of upper-inner quadrant of right breast in female, estrogen receptor positive ([place]) 08/18/2013  . 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

## 2016-03-30 ENCOUNTER — Other Ambulatory Visit: Payer: Self-pay | Admitting: Oncology

## 2016-04-01 ENCOUNTER — Other Ambulatory Visit (HOSPITAL_BASED_OUTPATIENT_CLINIC_OR_DEPARTMENT_OTHER): Payer: 59

## 2016-04-01 ENCOUNTER — Encounter: Payer: 59 | Admitting: Physical Therapy

## 2016-04-01 DIAGNOSIS — C787 Secondary malignant neoplasm of liver and intrahepatic bile duct: Secondary | ICD-10-CM

## 2016-04-01 DIAGNOSIS — C7951 Secondary malignant neoplasm of bone: Secondary | ICD-10-CM

## 2016-04-01 DIAGNOSIS — C50211 Malignant neoplasm of upper-inner quadrant of right female breast: Secondary | ICD-10-CM

## 2016-04-01 DIAGNOSIS — Z170 Estrogen receptor positive status [ER+]: Secondary | ICD-10-CM

## 2016-04-01 DIAGNOSIS — C771 Secondary and unspecified malignant neoplasm of intrathoracic lymph nodes: Secondary | ICD-10-CM

## 2016-04-01 LAB — CBC WITH DIFFERENTIAL/PLATELET
BASO%: 2.9 % — AB (ref 0.0–2.0)
Basophils Absolute: 0.1 10*3/uL (ref 0.0–0.1)
EOS ABS: 0 10*3/uL (ref 0.0–0.5)
EOS%: 1.5 % (ref 0.0–7.0)
HEMATOCRIT: 32.5 % — AB (ref 34.8–46.6)
HGB: 11.5 g/dL — ABNORMAL LOW (ref 11.6–15.9)
LYMPH#: 0.5 10*3/uL — AB (ref 0.9–3.3)
LYMPH%: 17.5 % (ref 14.0–49.7)
MCH: 36.6 pg — AB (ref 25.1–34.0)
MCHC: 35.4 g/dL (ref 31.5–36.0)
MCV: 103.5 fL — ABNORMAL HIGH (ref 79.5–101.0)
MONO#: 0.2 10*3/uL (ref 0.1–0.9)
MONO%: 6.9 % (ref 0.0–14.0)
NEUT#: 2 10*3/uL (ref 1.5–6.5)
NEUT%: 71.2 % (ref 38.4–76.8)
PLATELETS: 296 10*3/uL (ref 145–400)
RBC: 3.14 10*6/uL — ABNORMAL LOW (ref 3.70–5.45)
RDW: 13.5 % (ref 11.2–14.5)
WBC: 2.7 10*3/uL — ABNORMAL LOW (ref 3.9–10.3)
nRBC: 0 % (ref 0–0)

## 2016-04-02 ENCOUNTER — Ambulatory Visit: Payer: 59 | Attending: Oncology | Admitting: Physical Therapy

## 2016-04-02 DIAGNOSIS — M79651 Pain in right thigh: Secondary | ICD-10-CM | POA: Diagnosis present

## 2016-04-02 DIAGNOSIS — I890 Lymphedema, not elsewhere classified: Secondary | ICD-10-CM | POA: Diagnosis present

## 2016-04-02 DIAGNOSIS — M62838 Other muscle spasm: Secondary | ICD-10-CM

## 2016-04-02 NOTE — Therapy (Signed)
[place], [place], [address] Phone: [phone]   Fax:  [phone]  Physical Therapy Treatment  Patient Details  Name: [name] [name] MRN: [id number] Date of Birth: 10/24/1960 Referring Provider: Dr. Kyung Rudd  Encounter Date: 04/02/2016      PT End of Session - 04/02/16 1312    Visit Number 1  for the year 2018   Number of Visits 25  for lymphedema   Date for PT Re-Evaluation 06/25/16   Authorization - Number of Visits 61   PT Start Time 1103   PT Stop Time 1150   PT Time Calculation (min) 47 min   Activity Tolerance Patient tolerated treatment well   Behavior During Therapy [facility] for tasks assessed/performed      Past Medical History:  Diagnosis Date  . Bone metastases ([place]) dx'd 05/2014  . Breast cancer ([facility]) dx'd 2005/2011  . Peripheral vascular disease ([place]) 02/2010   blood clot related to porta cath  . PONV (postoperative nausea and vomiting)   . S/P radiation therapy 07/17/2014 through 08/02/2014    Left mediastinum, left seventh rib 3250 cGy in 13 sessions   . S/P radiation therapy 12/11/2014 through 12/22/2014    Left parietal calvarium 2400 cGy in 8 sessions   . Seizures ([place]) 2010   Isolated incident.    Past Surgical History:  Procedure Laterality Date  . AXILLARY LYMPH NODE DISSECTION  Dec. 2011  . BREAST LUMPECTOMY  2005  . MEDIASTINOTOMY CHAMBERLAIN MCNEIL Left 06/02/2013   Procedure: MEDIASTINOTOMY CHAMBERLAIN MCNEIL;  Surgeon: [name], MD;  Location: [facility];  Service: Thoracic;  Laterality: Left;  LEFT ANTERIOR MEDIASTINOTOMY   . PORTACATH PLACEMENT  12/11  . removal portacath      There were no vitals filed for this visit.      Subjective Assessment - 04/02/16 1106    Subjective Having low back  pain and right upper back pain, as well as tightness at right thigh.   Currently in Pain? Yes   Pain Score 6    Pain Location Axilla   Pain Orientation Right   Pain Descriptors / Indicators Other (Comment)  fullness   Pain Type Chronic pain   Aggravating Factors  more swelling   Pain Relieving Factors therapy   Pain Score 3   Pain Location Leg   Pain Orientation Right;Left;Posterior   Pain Descriptors / Indicators Tightness   Pain Type Chronic pain   Aggravating Factors  sometimes more walking or stair climbing   Pain Relieving Factors myofascial release, soft tissue work   Pain Score 2   Pain Location Back   Pain Orientation Lower   Pain Descriptors / Indicators Other (Comment)  pain   Pain Type Acute pain                         [facility] Adult PT Treatment/Exercise - 04/02/16 0001      Manual Therapy   Manual Therapy Scapular mobilization   Myofascial Release right axilla with focus on scar tightness   Scapular Mobilization to right scapula in left sidelying, gently rocking motions with scapula into protraction at different angles   Manual Lymphatic Drainage (MLD) In left sidelying, posterior interaxillary anastomosis and right axillo-inguinal anastomosis; in supine, short neck, left axilla and anterior interaxillary anastomosis, right groin and axillo-inguinal anastomosis, and right upper extremity from fingers to shoulder.  In right sidelying, left periscapular area toward left groin.  Other Manual Therapy soft tissue work at superior aspect of right medial and upper thigh in prone position for pain relief and muscle relaxation, gentle myofascial release at upper thigh, with releases today from far medial to lateral aspects of posterior thigh                  PT Short Term Goals - 02/12/16 1226      PT SHORT TERM GOAL #1   Title pain with walking decreased >/= 25%   Time 4   Period Weeks   Status Achieved           PT Long Term Goals -  03/11/16 1308      PT LONG TERM GOAL #1   Title indpendent with HEP   Time 8   Period Weeks   Status On-going     PT LONG TERM GOAL #2   Title pain with walking decreased >/= 75%   Time 8   Period Weeks   Status On-going  60% better     PT LONG TERM GOAL #3   Title ability to flex her right hip with right groin pain decreased >/= 50% due to improved tissue mobility   Time 8   Period Weeks   Status Achieved  50% better     PT LONG TERM GOAL #4   Title waking up in middle of night with pain decreased >/=50%   Time 8   Period Weeks   Status New     PT LONG TERM GOAL #5   Title reduction of pain by end of day >/= 50% due to increase tissue mobility   Time 8   Period Weeks   Status New           Long Term Clinic Goals - 04/02/16 1318      CC Long Term Goal  #2   Title Pt. will report swelling is adequately managed to enable ADL function at a consistent level.   Status On-going     CC Long Term Goal  #4   Title Pain/discomfort at right axilla area will be controlled at 6/10 or less.   Status On-going     CC Long Term Goal  #5   Title Patient will avoid infection by ongoing management of her lymphedema at right breast/axilla/upper arm areas.   Status On-going     CC Long Term Goal  #6   Title Pain in right groin/ischial tuberosity area will be controlled at 3/10 level or less to enable walking with less pain.   Status On-going            Plan - 04/02/16 1314    Clinical Impression Statement Patient with chronic mild lymphedema in right UE, particularly the hand, and mild-moderate lymphedema at right upper outer breast and posterior to that, and with discomfort that accompanies that; also with right medial to posterior thigh pain that has become chronic; now also with c/o LBP and some pain at area of right scapula.  She has a Flexitouch lymphedema pump but has been unable to use it because the trunk garment portion irritates some of her pelvic area pain.  She  gets pain reduction and some softening of swollen areas with therapy; she also gets relief of thigh pain.  Therapy twice a week has helped her avoid worsening of her symptoms and helped her maintain her ability to be active and do ADLs   Rehab Potential Good   Clinical Impairments Affecting Rehab Potential  active cancer   PT Frequency 2x / week   PT Duration 12 weeks   PT Treatment/Interventions ADLs/Self Care Home Management;Therapeutic activities;Therapeutic exercise;Patient/family education;Manual techniques;Manual lymph drainage;Scar mobilization   PT Next Visit Plan continue manual lymph drainage and soft tissue work for all problem areas   PT Home Exercise Plan progress as needed   Consulted and Agree with Plan of Care Patient   PT Plan .      Patient will benefit from skilled therapeutic intervention in order to improve the following deficits and impairments:  Increased edema, Increased fascial restricitons, Pain  Visit Diagnosis: Lymphedema, not elsewhere classified - Plan: PT plan of care cert/re-cert  Pain in right thigh - Plan: PT plan of care cert/re-cert  Other muscle spasm - Plan: PT plan of care cert/re-cert     Problem List Patient Active Problem List   Diagnosis Date Noted  . Liver metastases ([place]) 02/23/2016  . 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 Zoster 04/04/2015  . Nausea with vomiting 11/18/2014  . Constipation 11/18/2014  . Left-sided thoracic back pain   . Bone metastases ([place]) 11/16/2014  . Back pain 11/15/2014  . Uncontrolled pain 11/14/2014  . Post-lymphadenectomy lymphedema of arm 05/31/2014  . Chest wall pain 03/21/2014  . Abnormal LFTs (liver function tests) 09/12/2013  . Malignant neoplasm of upper-inner quadrant of right breast in female, estrogen receptor positive ([place]) 08/18/2013  . 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

## 2016-04-04 ENCOUNTER — Ambulatory Visit: Payer: 59 | Admitting: Physical Therapy

## 2016-04-04 DIAGNOSIS — I890 Lymphedema, not elsewhere classified: Secondary | ICD-10-CM

## 2016-04-04 DIAGNOSIS — M79651 Pain in right thigh: Secondary | ICD-10-CM

## 2016-04-04 NOTE — Therapy (Signed)
[place], [place], [address] Phone: [phone]   Fax:  [phone]  Physical Therapy Treatment  Patient Details  Name: [name] [name] MRN: [id number] Date of Birth: 21-Mar-1961 Referring Provider: Dr. Kyung Rudd  Encounter Date: 04/04/2016      PT End of Session - 04/04/16 1201    Visit Number 2  for 2018   Number of Visits 25  for lymphedema   Date for PT Re-Evaluation 06/25/16   Authorization - Number of Visits 60   PT Start Time 1015   PT Stop Time 1103   PT Time Calculation (min) 48 min   Activity Tolerance Patient tolerated treatment well   Behavior During Therapy [facility] for tasks assessed/performed      Past Medical History:  Diagnosis Date  . Bone metastases ([place]) dx'd 05/2014  . Breast cancer ([place]) dx'd 2005/2011  . Peripheral vascular disease ([place]) 02/2010   blood clot related to porta cath  . PONV (postoperative nausea and vomiting)   . S/P radiation therapy 07/17/2014 through 08/02/2014    Left mediastinum, left seventh rib 3250 cGy in 13 sessions   . S/P radiation therapy 12/11/2014 through 12/22/2014    Left parietal calvarium 2400 cGy in 8 sessions   . Seizures ([place]) 2010   Isolated incident.    Past Surgical History:  Procedure Laterality Date  . AXILLARY LYMPH NODE DISSECTION  Dec. 2011  . BREAST LUMPECTOMY  2005  . MEDIASTINOTOMY CHAMBERLAIN MCNEIL Left 06/02/2013   Procedure: MEDIASTINOTOMY CHAMBERLAIN MCNEIL;  Surgeon: [name], MD;  Location: [facility];  Service: Thoracic;  Laterality: Left;  LEFT ANTERIOR MEDIASTINOTOMY   . PORTACATH PLACEMENT  12/11  . removal portacath      There were no vitals filed for this visit.      Subjective Assessment - 04/04/16 1016    Subjective Third and fourth fingers of  right hand swelled up yesterday like nobody's business.   Currently in Pain? Yes   Pain Score 5    Pain Location Axilla   Pain Orientation Right   Pain Score 3   Pain Location Leg   Pain Orientation Right   Pain Descriptors / Indicators Aching;Tightness   Pain Type Chronic pain   Pain Relieving Factors therapy, lying on hard surface and stretching                         [facility] Adult PT Treatment/Exercise - 04/04/16 0001      Manual Therapy   Myofascial Release right axilla with focus on scar tightness   Manual Lymphatic Drainage (MLD) In left sidelying, posterior interaxillary anastomosis and right axillo-inguinal anastomosis; in supine, short neck, left axilla and anterior interaxillary anastomosis, right groin and axillo-inguinal anastomosis, and right upper extremity from fingers to shoulder.  In right sidelying, left periscapular area toward left groin.   Other Manual Therapy soft tissue work at superior aspect of right medial and upper thigh in prone position for pain relief and muscle relaxation, gentle myofascial release at upper thigh, with releases today from far medial to lateral aspects of posterior thigh                  PT Short Term Goals - 02/12/16 1226      PT SHORT TERM GOAL #1   Title pain with walking decreased >/= 25%   Time 4   Period Weeks   Status Achieved  PT Long Term Goals - 03/11/16 1308      PT LONG TERM GOAL #1   Title indpendent with HEP   Time 8   Period Weeks   Status On-going     PT LONG TERM GOAL #2   Title pain with walking decreased >/= 75%   Time 8   Period Weeks   Status On-going  60% better     PT LONG TERM GOAL #3   Title ability to flex her right hip with right groin pain decreased >/= 50% due to improved tissue mobility   Time 8   Period Weeks   Status Achieved  50% better     PT LONG TERM GOAL #4   Title waking up in middle of night with pain decreased >/=50%   Time 8   Period  Weeks   Status New     PT LONG TERM GOAL #5   Title reduction of pain by end of day >/= 50% due to increase tissue mobility   Time 8   Period Weeks   Status New           Long Term Clinic Goals - 04/02/16 1318      CC Long Term Goal  #2   Title Pt. will report swelling is adequately managed to enable ADL function at a consistent level.   Status On-going     CC Long Term Goal  #4   Title Pain/discomfort at right axilla area will be controlled at 6/10 or less.   Status On-going     CC Long Term Goal  #5   Title Patient will avoid infection by ongoing management of her lymphedema at right breast/axilla/upper arm areas.   Status On-going     CC Long Term Goal  #6   Title Pain in right groin/ischial tuberosity area will be controlled at 3/10 level or less to enable walking with less pain.   Status On-going            Plan - 04/04/16 1201    Clinical Impression Statement Right upper outer breast feels more firm today; pain in that area seems slightly better lately, despite patient's report that her right hand swelled yesterday.  Right thigh pain a little more aggravated today.   Rehab Potential Good   Clinical Impairments Affecting Rehab Potential active cancer   PT Frequency 2x / week   PT Duration 12 weeks   PT Treatment/Interventions ADLs/Self Care Home Management;Therapeutic activities;Therapeutic exercise;Patient/family education;Manual techniques;Manual lymph drainage;Scar mobilization   PT Next Visit Plan continue manual lymph drainage and soft tissue work for all problem areas   Consulted and Agree with Plan of Care Patient      Patient will benefit from skilled therapeutic intervention in order to improve the following deficits and impairments:  Increased edema, Increased fascial restricitons, Pain  Visit Diagnosis: Lymphedema, not elsewhere classified  Pain in right thigh     Problem List Patient Active Problem List   Diagnosis Date Noted  . Liver  metastases ([place]) 02/23/2016  . 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 Zoster 04/04/2015  . Nausea with vomiting 11/18/2014  . Constipation 11/18/2014  . Left-sided thoracic back pain   . Bone metastases ([place]) 11/16/2014  . Back pain 11/15/2014  . Uncontrolled pain 11/14/2014  . Post-lymphadenectomy lymphedema of arm 05/31/2014  . Chest wall pain 03/21/2014  . Abnormal LFTs (liver function tests) 09/12/2013  . Malignant neoplasm of upper-inner quadrant of right breast in female, estrogen receptor  positive ([place]) 08/18/2013  . 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

## 2016-04-07 ENCOUNTER — Ambulatory Visit: Payer: 59 | Admitting: Physical Therapy

## 2016-04-07 ENCOUNTER — Other Ambulatory Visit (HOSPITAL_BASED_OUTPATIENT_CLINIC_OR_DEPARTMENT_OTHER): Payer: 59

## 2016-04-07 DIAGNOSIS — C787 Secondary malignant neoplasm of liver and intrahepatic bile duct: Secondary | ICD-10-CM | POA: Diagnosis not present

## 2016-04-07 DIAGNOSIS — I890 Lymphedema, not elsewhere classified: Secondary | ICD-10-CM | POA: Diagnosis not present

## 2016-04-07 DIAGNOSIS — M79651 Pain in right thigh: Secondary | ICD-10-CM

## 2016-04-07 DIAGNOSIS — C7951 Secondary malignant neoplasm of bone: Secondary | ICD-10-CM

## 2016-04-07 DIAGNOSIS — C771 Secondary and unspecified malignant neoplasm of intrathoracic lymph nodes: Secondary | ICD-10-CM

## 2016-04-07 DIAGNOSIS — Z170 Estrogen receptor positive status [ER+]: Secondary | ICD-10-CM

## 2016-04-07 DIAGNOSIS — C50211 Malignant neoplasm of upper-inner quadrant of right female breast: Secondary | ICD-10-CM | POA: Diagnosis not present

## 2016-04-07 LAB — COMPREHENSIVE METABOLIC PANEL
ALT: 32 U/L (ref 0–55)
AST: 29 U/L (ref 5–34)
Albumin: 4.2 g/dL (ref 3.5–5.0)
Alkaline Phosphatase: 79 U/L (ref 40–150)
Anion Gap: 9 mEq/L (ref 3–11)
BUN: 10.8 mg/dL (ref 7.0–26.0)
CALCIUM: 9.7 mg/dL (ref 8.4–10.4)
CHLORIDE: 100 meq/L (ref 98–109)
CO2: 26 mEq/L (ref 22–29)
CREATININE: 0.9 mg/dL (ref 0.6–1.1)
EGFR: 73 mL/min/{1.73_m2} — ABNORMAL LOW (ref >90)
GLUCOSE: 81 mg/dL (ref 70–140)
Potassium: 4 mEq/L (ref 3.5–5.1)
SODIUM: 135 meq/L — AB (ref 136–145)
Total Bilirubin: 0.35 mg/dL (ref 0.20–1.20)
Total Protein: 7.3 g/dL (ref 6.4–8.3)

## 2016-04-07 LAB — CBC WITH DIFFERENTIAL/PLATELET
BASO%: 2.4 % — AB (ref 0.0–2.0)
Basophils Absolute: 0.1 10*3/uL (ref 0.0–0.1)
EOS%: 2.5 % (ref 0.0–7.0)
Eosinophils Absolute: 0.1 10*3/uL (ref 0.0–0.5)
HEMATOCRIT: 33.3 % — AB (ref 34.8–46.6)
HGB: 11.8 g/dL (ref 11.6–15.9)
LYMPH#: 0.3 10*3/uL — AB (ref 0.9–3.3)
LYMPH%: 15.3 % (ref 14.0–49.7)
MCH: 38.6 pg — ABNORMAL HIGH (ref 25.1–34.0)
MCHC: 35.3 g/dL (ref 31.5–36.0)
MCV: 109.4 fL — ABNORMAL HIGH (ref 79.5–101.0)
MONO#: 0.2 10*3/uL (ref 0.1–0.9)
MONO%: 8.9 % (ref 0.0–14.0)
NEUT#: 1.5 10*3/uL (ref 1.5–6.5)
NEUT%: 70.9 % (ref 38.4–76.8)
Platelets: 246 10*3/uL (ref 145–400)
RBC: 3.05 10*6/uL — ABNORMAL LOW (ref 3.70–5.45)
RDW: 14.1 % (ref 11.2–14.5)
WBC: 2.1 10*3/uL — ABNORMAL LOW (ref 3.9–10.3)

## 2016-04-07 NOTE — Therapy (Signed)
[place], [place], [address] Phone: [phone]   Fax:  [phone]  Physical Therapy Treatment  Patient Details  Name: [name] [name] MRN: [id number] Date of Birth: [birth year] Referring Provider: Dr. Kyung Rudd  Encounter Date: 04/07/2016      PT End of Session - 04/07/16 1316    Visit Number 3  for 2018   Number of Visits 25  for lymphedema   Date for PT Re-Evaluation 06/25/16   Authorization - Number of Visits 60  for 2018   PT Start Time 1020   PT Stop Time 1102   PT Time Calculation (min) 42 min   Activity Tolerance Patient tolerated treatment well   Behavior During Therapy [facility] for tasks assessed/performed      Past Medical History:  Diagnosis Date  . Bone metastases ([place]) dx'd 05/2014  . Breast cancer ([place]) dx'd 2005/2011  . Peripheral vascular disease ([place]) 02/2010   blood clot related to porta cath  . PONV (postoperative nausea and vomiting)   . S/P radiation therapy 07/17/2014 through 08/02/2014    Left mediastinum, left seventh rib 3250 cGy in 13 sessions   . S/P radiation therapy 12/11/2014 through 12/22/2014    Left parietal calvarium 2400 cGy in 8 sessions   . Seizures ([place]) 2010   Isolated incident.    Past Surgical History:  Procedure Laterality Date  . AXILLARY LYMPH NODE DISSECTION  Dec. 2011  . BREAST LUMPECTOMY  2005  . MEDIASTINOTOMY CHAMBERLAIN MCNEIL Left 06/02/2013   Procedure: MEDIASTINOTOMY CHAMBERLAIN MCNEIL;  Surgeon: [name], MD;  Location: [facility];  Service: Thoracic;  Laterality: Left;  LEFT ANTERIOR MEDIASTINOTOMY   . PORTACATH PLACEMENT  12/11  . removal portacath      There were no vitals filed for this visit.      Subjective Assessment - 04/07/16 1022    Subjective A little swollen  in the hand.  Groin area pain is not the aching bone pain it was last week.   Currently in Pain? Yes   Pain Score 6   5-6   Pain Location Axilla   Pain Orientation Right   Pain Score 2   Pain Location Leg   Pain Orientation Right   Pain Descriptors / Indicators --  an awareness, a tightness                         [facility] Adult PT Treatment/Exercise - 04/07/16 0001      Manual Therapy   Myofascial Release right axilla with focus on scar tightness   Manual Lymphatic Drainage (MLD) In left sidelying, posterior interaxillary anastomosis and right axillo-inguinal anastomosis; in supine, short neck, left axilla and anterior interaxillary anastomosis, right groin and axillo-inguinal anastomosis, and right upper extremity from fingers to shoulder.  In right sidelying, left periscapular area toward left groin.   Other Manual Therapy soft tissue work at superior aspect of right medial and upper thigh in prone position for pain relief and muscle relaxation, gentle myofascial release at upper thigh, with releases today from far medial to lateral aspects of posterior thigh                  PT Short Term Goals - 02/12/16 1226      PT SHORT TERM GOAL #1   Title pain with walking decreased >/= 25%   Time 4   Period Weeks   Status Achieved  PT Long Term Goals - 03/11/16 1308      PT LONG TERM GOAL #1   Title indpendent with HEP   Time 8   Period Weeks   Status On-going     PT LONG TERM GOAL #2   Title pain with walking decreased >/= 75%   Time 8   Period Weeks   Status On-going  60% better     PT LONG TERM GOAL #3   Title ability to flex her right hip with right groin pain decreased >/= 50% due to improved tissue mobility   Time 8   Period Weeks   Status Achieved  50% better     PT LONG TERM GOAL #4   Title waking up in middle of night with pain decreased >/=50%   Time 8   Period Weeks   Status New     PT LONG TERM GOAL #5   Title  reduction of pain by end of day >/= 50% due to increase tissue mobility   Time 8   Period Weeks   Status New           Long Term Clinic Goals - 04/02/16 1318      CC Long Term Goal  #2   Title Pt. will report swelling is adequately managed to enable ADL function at a consistent level.   Status On-going     CC Long Term Goal  #4   Title Pain/discomfort at right axilla area will be controlled at 6/10 or less.   Status On-going     CC Long Term Goal  #5   Title Patient will avoid infection by ongoing management of her lymphedema at right breast/axilla/upper arm areas.   Status On-going     CC Long Term Goal  #6   Title Pain in right groin/ischial tuberosity area will be controlled at 3/10 level or less to enable walking with less pain.   Status On-going            Plan - 04/07/16 1317    Clinical Impression Statement Right upper outer breast not as firm today; hand mildly swollen. Right thigh pain reduced today.  Continues to benefit from therapy.   Rehab Potential Good   Clinical Impairments Affecting Rehab Potential active cancer   PT Frequency 2x / week   PT Duration 12 weeks   PT Treatment/Interventions ADLs/Self Care Home Management;Therapeutic activities;Therapeutic exercise;Patient/family education;Manual techniques;Manual lymph drainage;Scar mobilization   PT Next Visit Plan Do measurements; continue manual lymph drainage and soft tissue work for all problem areas   Consulted and Agree with Plan of Care Patient      Patient will benefit from skilled therapeutic intervention in order to improve the following deficits and impairments:  Increased edema, Increased fascial restricitons, Pain  Visit Diagnosis: Lymphedema, not elsewhere classified  Pain in right thigh     Problem List Patient Active Problem List   Diagnosis Date Noted  . Liver metastases ([place]) 02/23/2016  . 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 Zoster 04/04/2015  . Nausea with  vomiting 11/18/2014  . Constipation 11/18/2014  . Left-sided thoracic back pain   . Bone metastases ([place]) 11/16/2014  . Back pain 11/15/2014  . Uncontrolled pain 11/14/2014  . Post-lymphadenectomy lymphedema of arm 05/31/2014  . Chest wall pain 03/21/2014  . Abnormal LFTs (liver function tests) 09/12/2013  . Malignant neoplasm of upper-inner quadrant of right breast in female, estrogen receptor positive ([place]) 08/18/2013  . Secondary malignant neoplasm of mediastinal  lymph node ([place]) 08/18/2013    [name],[name] 04/07/2016, 1:23 PM  [place], [place], [address] Phone: [phone]   Fax:  [phone]  Name: [name] [name] MRN: [id number] Date of Birth: [birth year]  [name], PT 04/07/16 1:24 PM

## 2016-04-08 ENCOUNTER — Ambulatory Visit: Payer: 59 | Attending: Radiation Oncology | Admitting: Physical Therapy

## 2016-04-08 ENCOUNTER — Encounter: Payer: Self-pay | Admitting: Physical Therapy

## 2016-04-08 DIAGNOSIS — M25551 Pain in right hip: Secondary | ICD-10-CM | POA: Insufficient documentation

## 2016-04-08 DIAGNOSIS — M79651 Pain in right thigh: Secondary | ICD-10-CM | POA: Diagnosis present

## 2016-04-08 DIAGNOSIS — M62838 Other muscle spasm: Secondary | ICD-10-CM | POA: Diagnosis present

## 2016-04-08 LAB — CANCER ANTIGEN 27.29: CA 27.29: 2610 U/mL — ABNORMAL HIGH (ref 0.0–38.6)

## 2016-04-08 NOTE — Therapy (Signed)
[facility] [address] [place], [place], [address] Phone: [phone]   Fax:  [phone]  Physical Therapy Treatment  Patient Details  Name: [name] [name] MRN: [id number] Date of Birth: [birth year] Referring Provider: Dr. Kyung Rudd  Encounter Date: 04/08/2016      PT End of Session - 04/08/16 1405    Visit Number 4   Date for PT Re-Evaluation 05/06/16   PT Start Time 1230   PT Stop Time 1310   PT Time Calculation (min) 40 min   Activity Tolerance Patient tolerated treatment well   Behavior During Therapy [facility] for tasks assessed/performed      Past Medical History:  Diagnosis Date  . Bone metastases ([place]) dx'd 05/2014  . Breast cancer ([place]) dx'd 2005/2011  . Peripheral vascular disease ([place]) 02/2010   blood clot related to porta cath  . PONV (postoperative nausea and vomiting)   . S/P radiation therapy 07/17/2014 through 08/02/2014    Left mediastinum, left seventh rib 3250 cGy in 13 sessions   . S/P radiation therapy 12/11/2014 through 12/22/2014    Left parietal calvarium 2400 cGy in 8 sessions   . Seizures ([place]) 2010   Isolated incident.    Past Surgical History:  Procedure Laterality Date  . AXILLARY LYMPH NODE DISSECTION  Dec. 2011  . BREAST LUMPECTOMY  2005  . MEDIASTINOTOMY CHAMBERLAIN MCNEIL Left 06/02/2013   Procedure: MEDIASTINOTOMY CHAMBERLAIN MCNEIL;  Surgeon: [name], MD;  Location: [place];  Service: Thoracic;  Laterality: Left;  LEFT ANTERIOR MEDIASTINOTOMY   . PORTACATH PLACEMENT  12/11  . removal portacath      There were no vitals filed for this visit.      Subjective Assessment - 04/08/16 1233    Subjective After last treatment, I had pain in front of the vaginal area for 2 days.  I feel when tissue was released,  the pressure may of caused the pain.  The pain was by the fracture. [name] [name] is working on the external area byt the hamstring. Today I have a tight feeling in the right groin. I want to fing out how much inflammation is by the bone. The PET showed an area that was lighted up on the right ischial tuberosity.  Everyday I have a level of discomfort.  When I sit there is an awareness on the right ishcial tuberosity.  The more I walk and as the day continues the tightness gets worse.  Some days are a flare-day with an achy feeling.    Pertinent History patient vomited just prior to treatment today   Limitations Writing   How long can you walk comfortably? more she walks patient will have increased pain that stops her from walking further   Diagnostic tests x rays of back show no fractures;    Patient Stated Goals walk 20 minutes with minimal limp   Currently in Pain? Yes   Pain Score 2   some days is 4/10   Pain Location --  right ischial tuberosity   Pain Orientation Right   Pain Descriptors / Indicators Aching;Tightness   Pain Type Chronic pain   Pain Onset More than a month ago   Pain Frequency Constant   Aggravating Factors  as day progresses, sitting, longer she walks   Pain Relieving Factors therapy   Multiple Pain Sites No                      Pelvic Floor  Special Questions - 04/08/16 0001    Pelvic Floor Internal Exam Patient approves physical therapist to perform pelvic floor muscle assessment   Exam Type Vaginal           [facility] Adult PT Treatment/Exercise - 04/08/16 0001      Manual Therapy   Manual Therapy Soft tissue mobilization;Internal Pelvic Floor   Internal Pelvic Floor right side and left side of urethra; posterior vaginal  wall stretch but very tender, bil. obturator internist, thickening of the right side of the urethra   Other Manual Therapy right levator ani externally, right hamstring, right hip adductor                  PT Short Term  Goals - 02/12/16 1226      PT SHORT TERM GOAL #1   Title pain with walking decreased >/= 25%   Time 4   Period Weeks   Status Achieved           PT Long Term Goals - 04/08/16 1239      PT LONG TERM GOAL #1   Title indpendent with HEP   Time 8   Period Weeks   Status On-going     PT LONG TERM GOAL #2   Title pain with walking decreased >/= 75%   Time 8   Period Weeks   Status --  not walking with cane, 40% better     PT LONG TERM GOAL #3   Title ability to flex her right hip with right groin pain decreased >/= 50% due to improved tissue mobility   Period Weeks   Status Achieved     PT LONG TERM GOAL #4   Title waking up in middle of night with pain decreased >/=50%   Time 8   Period Weeks   Status Achieved     PT LONG TERM GOAL #5   Title reduction of pain by end of day >/= 50% due to increase tissue mobility   Time 8   Period Weeks   Status On-going  40% better           Long Term Clinic Goals - 04/02/16 1318      CC Long Term Goal  #2   Title Pt. will report swelling is adequately managed to enable ADL function at a consistent level.   Status On-going     CC Long Term Goal  #4   Title Pain/discomfort at right axilla area will be controlled at 6/10 or less.   Status On-going     CC Long Term Goal  #5   Title Patient will avoid infection by ongoing management of her lymphedema at right breast/axilla/upper arm areas.   Status On-going     CC Long Term Goal  #6   Title Pain in right groin/ischial tuberosity area will be controlled at 3/10 level or less to enable walking with less pain.   Status On-going            Plan - 04/08/16 1414    Clinical Impression Statement Patient has a band in the posterior vaginal opening that is tight and painful with decreased size of vaginal opening. After treatment pain decreased by 60% and able to urinate with a straighter stream. Patient has a ridge on the right side of the urethra that may be from the  fracture.  Patient had thickness located on the right side of urethra. Tightness located in the mid right hamstring.  Patient will benefit from  skilled therapy to reduce pain and improve muscle mobility.    Rehab Potential Good   Clinical Impairments Affecting Rehab Potential active cancer   PT Frequency 1x / week   PT Duration 8 weeks   PT Treatment/Interventions ADLs/Self Care Home Management;Therapeutic activities;Therapeutic exercise;Patient/family education;Manual techniques;Manual lymph drainage;Scar mobilization   PT Next Visit Plan soft tissue work; educate on dilator, see what Dr. Lisbeth Renshaw says   PT Home Exercise Plan progress as needed   Consulted and Agree with Plan of Care Patient      Patient will benefit from skilled therapeutic intervention in order to improve the following deficits and impairments:  Increased edema, Increased fascial restricitons, Pain  Visit Diagnosis: Other muscle spasm  Pain in right hip     Problem List Patient Active Problem List   Diagnosis Date Noted  . Liver metastases ([place]) 02/23/2016  . 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 Zoster 04/04/2015  . Nausea with vomiting 11/18/2014  . Constipation 11/18/2014  . Left-sided thoracic back pain   . Bone metastases ([place]) 11/16/2014  . Back pain 11/15/2014  . Uncontrolled pain 11/14/2014  . 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 Chest wall pain 03/21/2014  . Abnormal LFTs (liver function tests) 09/12/2013  . Malignant neoplasm of upper-inner quadrant of right breast in female, estrogen receptor positive ([place]) 08/18/2013  . 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 [place], [place], [address] Phone: [phone]   Fax:  [phone]  Name: [name] [name] MRN: [id number] Date of Birth: 20-Oct-1960

## 2016-04-09 ENCOUNTER — Ambulatory Visit (HOSPITAL_BASED_OUTPATIENT_CLINIC_OR_DEPARTMENT_OTHER): Payer: 59

## 2016-04-09 DIAGNOSIS — Z5111 Encounter for antineoplastic chemotherapy: Secondary | ICD-10-CM

## 2016-04-09 DIAGNOSIS — C50211 Malignant neoplasm of upper-inner quadrant of right female breast: Secondary | ICD-10-CM

## 2016-04-09 DIAGNOSIS — C7951 Secondary malignant neoplasm of bone: Secondary | ICD-10-CM | POA: Diagnosis not present

## 2016-04-09 DIAGNOSIS — C771 Secondary and unspecified malignant neoplasm of intrathoracic lymph nodes: Secondary | ICD-10-CM

## 2016-04-09 MED ORDER — FULVESTRANT 250 MG/5ML IM SOLN
500.0000 mg | Freq: Once | INTRAMUSCULAR | Status: AC
  Administered 2016-04-09: 500 mg via INTRAMUSCULAR
  Filled 2016-04-09: qty 10

## 2016-04-09 MED ORDER — DENOSUMAB 120 MG/1.7ML ~~LOC~~ SOLN
120.0000 mg | Freq: Once | SUBCUTANEOUS | Status: AC
  Administered 2016-04-09: 120 mg via SUBCUTANEOUS
  Filled 2016-04-09: qty 1.7

## 2016-04-09 NOTE — Patient Instructions (Signed)
Denosumab injection What is this medicine? DENOSUMAB (den oh sue mab) slows bone breakdown. Prolia is used to treat osteoporosis in women after menopause and in men. Xgeva is used to prevent bone fractures and other bone problems caused by cancer bone metastases. Xgeva is also used to treat giant cell tumor of the bone. This medicine may be used for other purposes; ask your health care provider or pharmacist if you have questions. What should I tell my health care provider before I take this medicine? They need to know if you have any of these conditions: -dental disease -eczema -infection or history of infections -kidney disease or on dialysis -low blood calcium or vitamin D -malabsorption syndrome -scheduled to have surgery or tooth extraction -taking medicine that contains denosumab -thyroid or parathyroid disease -an unusual reaction to denosumab, other medicines, foods, dyes, or preservatives -pregnant or trying to get pregnant -breast-feeding How should I use this medicine? This medicine is for injection under the skin. It is given by a health care professional in a hospital or clinic setting. If you are getting Prolia, a special MedGuide will be given to you by the pharmacist with each prescription and refill. Be sure to read this information carefully each time. For Prolia, talk to your pediatrician regarding the use of this medicine in children. Special care may be needed. For Xgeva, talk to your pediatrician regarding the use of this medicine in children. While this drug may be prescribed for children as young as 13 years for selected conditions, precautions do apply. Overdosage: If you think you have taken too much of this medicine contact a poison control center or emergency room at once. NOTE: This medicine is only for you. Do not share this medicine with others. What if I miss a dose? It is important not to miss your dose. Call your doctor or health care professional if you are  unable to keep an appointment. What may interact with this medicine? Do not take this medicine with any of the following medications: -other medicines containing denosumab This medicine may also interact with the following medications: -medicines that suppress the immune system -medicines that treat cancer -steroid medicines like prednisone or cortisone This list may not describe all possible interactions. Give your health care provider a list of all the medicines, herbs, non-prescription drugs, or dietary supplements you use. Also tell them if you smoke, drink alcohol, or use illegal drugs. Some items may interact with your medicine. What should I watch for while using this medicine? Visit your doctor or health care professional for regular checks on your progress. Your doctor or health care professional may order blood tests and other tests to see how you are doing. Call your doctor or health care professional if you get a cold or other infection while receiving this medicine. Do not treat yourself. This medicine may decrease your body's ability to fight infection. You should make sure you get enough calcium and vitamin D while you are taking this medicine, unless your doctor tells you not to. Discuss the foods you eat and the vitamins you take with your health care professional. See your dentist regularly. Brush and floss your teeth as directed. Before you have any dental work done, tell your dentist you are receiving this medicine. Do not become pregnant while taking this medicine or for 5 months after stopping it. Women should inform their doctor if they wish to become pregnant or think they might be pregnant. There is a potential for serious side effects   to an unborn child. Talk to your health care professional or pharmacist for more information. What side effects may I notice from receiving this medicine? Side effects that you should report to your doctor or health care professional as soon as  possible: -allergic reactions like skin rash, itching or hives, swelling of the face, lips, or tongue -breathing problems -chest pain -fast, irregular heartbeat -feeling faint or lightheaded, falls -fever, chills, or any other sign of infection -muscle spasms, tightening, or twitches -numbness or tingling -skin blisters or bumps, or is dry, peels, or red -slow healing or unexplained pain in the mouth or jaw -unusual bleeding or bruising Side effects that usually do not require medical attention (Report these to your doctor or health care professional if they continue or are bothersome.): -muscle pain -stomach upset, gas This list may not describe all possible side effects. Call your doctor for medical advice about side effects. You may report side effects to FDA at 1-800-FDA-1088. Where should I keep my medicine? This medicine is only given in a clinic, doctor's office, or other health care setting and will not be stored at home. NOTE: This sheet is a summary. It may not cover all possible information. If you have questions about this medicine, talk to your doctor, pharmacist, or health care provider.    2016, Elsevier/Gold Standard. (2011-09-15 12:37:47) Fulvestrant injection What is this medicine? FULVESTRANT (ful VES trant) blocks the effects of estrogen. It is used to treat breast cancer. This medicine may be used for other purposes; ask your health care provider or pharmacist if you have questions. What should I tell my health care provider before I take this medicine? They need to know if you have any of these conditions: -bleeding problems -liver disease -low levels of platelets in the blood -an unusual or allergic reaction to fulvestrant, other medicines, foods, dyes, or preservatives -pregnant or trying to get pregnant -breast-feeding How should I use this medicine? This medicine is for injection into a muscle. It is usually given by a health care professional in a  hospital or clinic setting. Talk to your pediatrician regarding the use of this medicine in children. Special care may be needed. Overdosage: If you think you have taken too much of this medicine contact a poison control center or emergency room at once. NOTE: This medicine is only for you. Do not share this medicine with others. What if I miss a dose? It is important not to miss your dose. Call your doctor or health care professional if you are unable to keep an appointment. What may interact with this medicine? -medicines that treat or prevent blood clots like warfarin, enoxaparin, and dalteparin This list may not describe all possible interactions. Give your health care provider a list of all the medicines, herbs, non-prescription drugs, or dietary supplements you use. Also tell them if you smoke, drink alcohol, or use illegal drugs. Some items may interact with your medicine. What should I watch for while using this medicine? Your condition will be monitored carefully while you are receiving this medicine. You will need important blood work done while you are taking this medicine. Do not become pregnant while taking this medicine or for at least 1 year after stopping it. Women of child-bearing potential will need to have a negative pregnancy test before starting this medicine. Women should inform their doctor if they wish to become pregnant or think they might be pregnant. There is a potential for serious side effects to an unborn child. Men   should inform their doctors if they wish to father a child. This medicine may lower sperm counts. Talk to your health care professional or pharmacist for more information. Do not breast-feed an infant while taking this medicine or for 1 year after the last dose. What side effects may I notice from receiving this medicine? Side effects that you should report to your doctor or health care professional as soon as possible: -allergic reactions like skin rash,  itching or hives, swelling of the face, lips, or tongue -feeling faint or lightheaded, falls -pain, tingling, numbness, or weakness in the legs -signs and symptoms of infection like fever or chills; cough; flu-like symptoms; sore throat -vaginal bleeding Side effects that usually do not require medical attention (report to your doctor or health care professional if they continue or are bothersome): -aches, pains -constipation -diarrhea -headache -hot flashes -nausea, vomiting -pain at site where injected -stomach pain This list may not describe all possible side effects. Call your doctor for medical advice about side effects. You may report side effects to FDA at 1-800-FDA-1088. Where should I keep my medicine? This drug is given in a hospital or clinic and will not be stored at home. NOTE: This sheet is a summary. It may not cover all possible information. If you have questions about this medicine, talk to your doctor, pharmacist, or health care provider.    2016, Elsevier/Gold Standard. (2014-10-13 11:03:55)  

## 2016-04-10 ENCOUNTER — Ambulatory Visit: Payer: 59 | Admitting: Physical Therapy

## 2016-04-10 ENCOUNTER — Ambulatory Visit
Admission: RE | Admit: 2016-04-10 | Discharge: 2016-04-10 | Disposition: A | Discharge status: Home / Self Care | Payer: 59 | Source: Ambulatory Visit | Attending: Radiation Oncology | Admitting: Radiation Oncology

## 2016-04-10 DIAGNOSIS — C7951 Secondary malignant neoplasm of bone: Secondary | ICD-10-CM | POA: Diagnosis not present

## 2016-04-10 DIAGNOSIS — I890 Lymphedema, not elsewhere classified: Secondary | ICD-10-CM | POA: Diagnosis not present

## 2016-04-10 DIAGNOSIS — Z170 Estrogen receptor positive status [ER+]: Secondary | ICD-10-CM | POA: Insufficient documentation

## 2016-04-10 DIAGNOSIS — C50211 Malignant neoplasm of upper-inner quadrant of right female breast: Secondary | ICD-10-CM | POA: Diagnosis not present

## 2016-04-10 DIAGNOSIS — C787 Secondary malignant neoplasm of liver and intrahepatic bile duct: Secondary | ICD-10-CM | POA: Diagnosis not present

## 2016-04-10 DIAGNOSIS — M79651 Pain in right thigh: Secondary | ICD-10-CM

## 2016-04-10 NOTE — Progress Notes (Signed)
Follow up rad tx T-11 14Gy 1 fraction,  Declined weight and bvitals, went to Physical therapy today, has lymph edema on right side, c/o bone pain tightness achiness right leg/and on bone left buttocks,   Gave dilators small+., small, medium  And surgi lube samples to go with her next physical therapy   And has appt then next Tuesday with [name] to demonstrate use and how often , patient wants to coordinate  appt  MRI  Liver  due sometime in February wants MD to schedule MRI pelvis at same time 11:46 AM

## 2016-04-10 NOTE — Therapy (Signed)
[place], [place], [address] Phone: [phone]   Fax:  [phone]  Physical Therapy Treatment  Patient Details  Name: [name] [name] MRN: [id number] Date of Birth: 12/02/1960 Referring Provider: Dr. Kyung Rudd  Encounter Date: 04/10/2016      PT End of Session - 04/10/16 1119    Visit Number 5  4 for lymph   Number of Visits 25  for lymphedema   Date for PT Re-Evaluation 06/25/16  for lymphedema   Authorization - Number of Visits 60   PT Start Time 1016   PT Stop Time 1105   PT Time Calculation (min) 49 min   Activity Tolerance Patient tolerated treatment well   Behavior During Therapy [facility] for tasks assessed/performed      Past Medical History:  Diagnosis Date  . Bone metastases ([place]) dx'd 05/2014  . Breast cancer ([place]) dx'd 2005/2011  . Peripheral vascular disease ([place]) 02/2010   blood clot related to porta cath  . PONV (postoperative nausea and vomiting)   . S/P radiation therapy 07/17/2014 through 08/02/2014    Left mediastinum, left seventh rib 3250 cGy in 13 sessions   . S/P radiation therapy 12/11/2014 through 12/22/2014    Left parietal calvarium 2400 cGy in 8 sessions   . Seizures ([place]) 2010   Isolated incident.    Past Surgical History:  Procedure Laterality Date  . AXILLARY LYMPH NODE DISSECTION  Dec. 2011  . BREAST LUMPECTOMY  2005  . MEDIASTINOTOMY CHAMBERLAIN MCNEIL Left 06/02/2013   Procedure: MEDIASTINOTOMY CHAMBERLAIN MCNEIL;  Surgeon: [name], MD;  Location: [facility];  Service: Thoracic;  Laterality: Left;  LEFT ANTERIOR MEDIASTINOTOMY   . PORTACATH PLACEMENT  12/11  . removal portacath      There were no vitals filed for this visit.      Subjective Assessment - 04/10/16 1023    Subjective Did  better the last time with pelvic therapy--it didn't hurt like the time before.  Will talk to Dr. Lisbeth Renshaw about this.   Currently in Pain? Yes   Pain Score 2    Pain Location Leg   Pain Orientation Right;Posterior;Upper   Pain Type Chronic pain   Pain Score 5   Pain Location Axilla   Pain Orientation Right   Pain Descriptors / Indicators Other (Comment)  full   Pain Type Chronic pain   Aggravating Factors  more swelling   Pain Relieving Factors therapy                         [facility] Adult PT Treatment/Exercise - 04/10/16 0001      Manual Therapy   Myofascial Release right axilla with focus on scar tightness   Manual Lymphatic Drainage (MLD) In left sidelying, posterior interaxillary anastomosis and right axillo-inguinal anastomosis; in supine, short neck, left axilla and anterior interaxillary anastomosis, right groin and axillo-inguinal anastomosis, and right upper extremity from fingers to shoulder.  In right sidelying, left periscapular area toward left groin.   Other Manual Therapy soft tissue work at superior aspect of right medial and upper thigh in prone position for pain relief and muscle relaxation, gentle myofascial release at upper thigh, with releases today from far medial to lateral aspects of posterior thigh                  PT Short Term Goals - 02/12/16 1226      PT SHORT TERM GOAL #1  Title pain with walking decreased >/= 25%   Time 4   Period Weeks   Status Achieved           PT Long Term Goals - 04/08/16 1239      PT LONG TERM GOAL #1   Title indpendent with HEP   Time 8   Period Weeks   Status On-going     PT LONG TERM GOAL #2   Title pain with walking decreased >/= 75%   Time 8   Period Weeks   Status --  not walking with cane, 40% better     PT LONG TERM GOAL #3   Title ability to flex her right hip with right groin pain decreased >/= 50% due to improved tissue mobility   Period Weeks   Status Achieved     PT LONG  TERM GOAL #4   Title waking up in middle of night with pain decreased >/=50%   Time 8   Period Weeks   Status Achieved     PT LONG TERM GOAL #5   Title reduction of pain by end of day >/= 50% due to increase tissue mobility   Time 8   Period Weeks   Status On-going  40% better           Long Term Clinic Goals - 04/02/16 1318      CC Long Term Goal  #2   Title Pt. will report swelling is adequately managed to enable ADL function at a consistent level.   Status On-going     CC Long Term Goal  #4   Title Pain/discomfort at right axilla area will be controlled at 6/10 or less.   Status On-going     CC Long Term Goal  #5   Title Patient will avoid infection by ongoing management of her lymphedema at right breast/axilla/upper arm areas.   Status On-going     CC Long Term Goal  #6   Title Pain in right groin/ischial tuberosity area will be controlled at 3/10 level or less to enable walking with less pain.   Status On-going            Plan - 04/10/16 1123    Clinical Impression Statement Right upper outer breast area feels less indurated and swollen today, and patient's pain is down there from her usual 5-6 to a straight 5.  Therapy continues to help both axilla and leg areas.   Rehab Potential Good   Clinical Impairments Affecting Rehab Potential active cancer   PT Frequency 2x / week   PT Duration 12 weeks   PT Treatment/Interventions ADLs/Self Care Home Management;Therapeutic activities;Therapeutic exercise;Patient/family education;Manual techniques;Manual lymph drainage;Scar mobilization   PT Next Visit Plan continue manual lymph drainage, soft tissue work, and myofascial release   Consulted and Agree with Plan of Care Patient      Patient will benefit from skilled therapeutic intervention in order to improve the following deficits and impairments:  Increased edema, Increased fascial restricitons, Pain  Visit Diagnosis: Lymphedema, not elsewhere classified  Pain  in right thigh     Problem List Patient Active Problem List   Diagnosis Date Noted  . Liver metastases ([place]) 02/23/2016  . 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 Zoster 04/04/2015  . Nausea with vomiting 11/18/2014  . Constipation 11/18/2014  . Left-sided thoracic back pain   . Bone metastases ([place]) 11/16/2014  . Back pain 11/15/2014  . Uncontrolled pain 11/14/2014  . Post-lymphadenectomy lymphedema of arm 05/31/2014  .  Chest wall pain 03/21/2014  . Abnormal LFTs (liver function tests) 09/12/2013  . Malignant neoplasm of upper-inner quadrant of right breast in female, estrogen receptor positive ([place]) 08/18/2013  . 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

## 2016-04-11 ENCOUNTER — Encounter: Payer: 59 | Admitting: Physical Therapy

## 2016-04-14 ENCOUNTER — Telehealth: Payer: Self-pay | Admitting: *Deleted

## 2016-04-14 ENCOUNTER — Other Ambulatory Visit: Payer: Self-pay | Admitting: Oncology

## 2016-04-14 ENCOUNTER — Other Ambulatory Visit (HOSPITAL_BASED_OUTPATIENT_CLINIC_OR_DEPARTMENT_OTHER): Payer: 59

## 2016-04-14 ENCOUNTER — Ambulatory Visit: Payer: 59 | Admitting: Physical Therapy

## 2016-04-14 ENCOUNTER — Ambulatory Visit: Admission: RE | Admit: 2016-04-14 | Payer: 59 | Source: Ambulatory Visit | Admitting: Radiation Oncology

## 2016-04-14 DIAGNOSIS — C50211 Malignant neoplasm of upper-inner quadrant of right female breast: Secondary | ICD-10-CM | POA: Diagnosis not present

## 2016-04-14 DIAGNOSIS — C787 Secondary malignant neoplasm of liver and intrahepatic bile duct: Secondary | ICD-10-CM | POA: Diagnosis not present

## 2016-04-14 DIAGNOSIS — Z170 Estrogen receptor positive status [ER+]: Principal | ICD-10-CM

## 2016-04-14 DIAGNOSIS — C771 Secondary and unspecified malignant neoplasm of intrathoracic lymph nodes: Secondary | ICD-10-CM

## 2016-04-14 DIAGNOSIS — C7951 Secondary malignant neoplasm of bone: Secondary | ICD-10-CM | POA: Diagnosis not present

## 2016-04-14 DIAGNOSIS — M79651 Pain in right thigh: Secondary | ICD-10-CM

## 2016-04-14 DIAGNOSIS — I890 Lymphedema, not elsewhere classified: Secondary | ICD-10-CM

## 2016-04-14 LAB — CBC WITH DIFFERENTIAL/PLATELET
BASO%: 3.1 % — ABNORMAL HIGH (ref 0.0–2.0)
Basophils Absolute: 0.1 10*3/uL (ref 0.0–0.1)
EOS%: 1.9 % (ref 0.0–7.0)
Eosinophils Absolute: 0.1 10*3/uL (ref 0.0–0.5)
HCT: 34.2 % — ABNORMAL LOW (ref 34.8–46.6)
HEMOGLOBIN: 12.1 g/dL (ref 11.6–15.9)
LYMPH#: 0.5 10*3/uL — AB (ref 0.9–3.3)
LYMPH%: 19.8 % (ref 14.0–49.7)
MCH: 37.1 pg — ABNORMAL HIGH (ref 25.1–34.0)
MCHC: 35.4 g/dL (ref 31.5–36.0)
MCV: 104.9 fL — ABNORMAL HIGH (ref 79.5–101.0)
MONO#: 0.3 10*3/uL (ref 0.1–0.9)
MONO%: 10.1 % (ref 0.0–14.0)
NEUT#: 1.7 10*3/uL (ref 1.5–6.5)
NEUT%: 65.1 % (ref 38.4–76.8)
NRBC: 0 % (ref 0–0)
Platelets: 177 10*3/uL (ref 145–400)
RBC: 3.26 10*6/uL — ABNORMAL LOW (ref 3.70–5.45)
RDW: 13.6 % (ref 11.2–14.5)
WBC: 2.6 10*3/uL — AB (ref 3.9–10.3)

## 2016-04-14 NOTE — Telephone Encounter (Signed)
This RN spoke with pt - post visit with Dr Lisbeth Renshaw.  Karstyn stated concerns with current therapy of the Ibrance at 100 mg daily.  Decreasing WBC/ANC " just the lowest they have been "  Episodes of feeling very " light headed/faint " can occur at anytime - [place]alah feels related to heme level which has been dropping ( presently 11.8 decreased from 12.5 one month ago ).  Per above [name] has had about 5 episodes over the post 3 weeks.  She has noted mild swelling under eyes and hair thinning on crown of scalp.  Per discussion Julia understands symptoms are probably related to current therapy as well as she does not want to change therapy. She just wants concerns and symptoms known to " her team " for good communication.  Blue also stated upcoming scans for treatment response - " Dr Lisbeth Renshaw will be communicating with Dr Jana Hakim " Once ordered and scheduled Tarya will call this RN to schedule IV access.  Renlee stated concerns as hopes regarding treatment response and possible need to change treatment course.  Per end of conversation - Zaley verified no need for interventions at this time- just need to communicate for chart update. Roze stated appreciation for discussion.  This note will be forwarded to MD's for review and information.

## 2016-04-14 NOTE — Therapy (Signed)
[place], [place], [address] Phone: [phone]   Fax:  [phone]  Physical Therapy Treatment  Patient Details  Name: [name] [name] MRN: [id number] Date of Birth: November 07, 1960 Referring Provider: Dr. Kyung Rudd  Encounter Date: 04/14/2016      PT End of Session - 04/14/16 1301    Visit Number 6  5 for lymph   Number of Visits 25  for lymphedema   Date for PT Re-Evaluation 06/25/16   Authorization - Number of Visits 60   PT Start Time 1023   PT Stop Time 1103   PT Time Calculation (min) 40 min   Activity Tolerance Patient tolerated treatment well   Behavior During Therapy [facility] for tasks assessed/performed      Past Medical History:  Diagnosis Date  . Bone metastases ([place]) dx'd 05/2014  . Breast cancer ([place]) dx'd 2005/2011  . Peripheral vascular disease ([place]) 02/2010   blood clot related to porta cath  . PONV (postoperative nausea and vomiting)   . S/P radiation therapy 07/17/2014 through 08/02/2014    Left mediastinum, left seventh rib 3250 cGy in 13 sessions   . S/P radiation therapy 12/11/2014 through 12/22/2014    Left parietal calvarium 2400 cGy in 8 sessions   . Seizures ([place]) 2010   Isolated incident.    Past Surgical History:  Procedure Laterality Date  . AXILLARY LYMPH NODE DISSECTION  Dec. 2011  . BREAST LUMPECTOMY  2005  . MEDIASTINOTOMY CHAMBERLAIN MCNEIL Left 06/02/2013   Procedure: MEDIASTINOTOMY CHAMBERLAIN MCNEIL;  Surgeon: [name], MD;  Location: [facility];  Service: Thoracic;  Laterality: Left;  LEFT ANTERIOR MEDIASTINOTOMY   . PORTACATH PLACEMENT  12/11  . removal portacath      There were no vitals filed for this visit.      Subjective Assessment - 04/14/16 1028    Subjective "My left hip is sort of  bugging me around the area where I got the shot, but I think it's bruise-related.  Upper and lower back both 'yapping' at me."  CA 27/29 count is up.   Currently in Pain? Yes   Pain Score 3    Pain Location Leg   Pain Orientation Right   Pain Descriptors / Indicators Aching   Pain Score 6   Pain Location Axilla   Pain Orientation Right                         [facility] Adult PT Treatment/Exercise - 04/14/16 0001      Manual Therapy   Myofascial Release right axilla with focus on scar tightness   Manual Lymphatic Drainage (MLD) In left sidelying, posterior interaxillary anastomosis and right axillo-inguinal anastomosis; in supine, short neck, left axilla and anterior interaxillary anastomosis, right groin and axillo-inguinal anastomosis, and right upper extremity from fingers to shoulder.  In right sidelying, left periscapular area toward left groin.   Other Manual Therapy soft tissue work at superior aspect of right medial and upper thigh in prone position for pain relief and muscle relaxation, gentle myofascial release at upper thigh, with releases today from far medial to lateral aspects of posterior thigh                  PT Short Term Goals - 02/12/16 1226      PT SHORT TERM GOAL #1   Title pain with walking decreased >/= 25%   Time 4   Period Weeks  Status Achieved           PT Long Term Goals - 04/08/16 1239      PT LONG TERM GOAL #1   Title indpendent with HEP   Time 8   Period Weeks   Status On-going     PT LONG TERM GOAL #2   Title pain with walking decreased >/= 75%   Time 8   Period Weeks   Status --  not walking with cane, 40% better     PT LONG TERM GOAL #3   Title ability to flex her right hip with right groin pain decreased >/= 50% due to improved tissue mobility   Period Weeks   Status Achieved     PT LONG TERM GOAL #4   Title waking up in middle of night with pain decreased >/=50%   Time 8   Period Weeks   Status  Achieved     PT LONG TERM GOAL #5   Title reduction of pain by end of day >/= 50% due to increase tissue mobility   Time 8   Period Weeks   Status On-going  40% better           Long Term Clinic Goals - 04/02/16 1318      CC Long Term Goal  #2   Title Pt. will report swelling is adequately managed to enable ADL function at a consistent level.   Status On-going     CC Long Term Goal  #4   Title Pain/discomfort at right axilla area will be controlled at 6/10 or less.   Status On-going     CC Long Term Goal  #5   Title Patient will avoid infection by ongoing management of her lymphedema at right breast/axilla/upper arm areas.   Status On-going     CC Long Term Goal  #6   Title Pain in right groin/ischial tuberosity area will be controlled at 3/10 level or less to enable walking with less pain.   Status On-going            Plan - 04/14/16 1301    Clinical Impression Statement Doing okay today; little change in status.   Rehab Potential Good   Clinical Impairments Affecting Rehab Potential active cancer   PT Frequency 2x / week   PT Duration 12 weeks   PT Treatment/Interventions ADLs/Self Care Home Management;Therapeutic activities;Therapeutic exercise;Patient/family education;Manual techniques;Manual lymph drainage;Scar mobilization   PT Next Visit Plan continue manual lymph drainage, soft tissue work, and myofascial release   Consulted and Agree with Plan of Care Patient      Patient will benefit from skilled therapeutic intervention in order to improve the following deficits and impairments:  Increased edema, Increased fascial restricitons, Pain  Visit Diagnosis: Lymphedema, not elsewhere classified  Pain in right thigh     Problem List Patient Active Problem List   Diagnosis Date Noted  . Liver metastases ([place]) 02/23/2016  . 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 Zoster 04/04/2015  . Nausea with vomiting 11/18/2014  . Constipation 11/18/2014   . Left-sided thoracic back pain   . Bone metastases ([place]) 11/16/2014  . Back pain 11/15/2014  . Uncontrolled pain 11/14/2014  . Post-lymphadenectomy lymphedema of arm 05/31/2014  . Chest wall pain 03/21/2014  . Abnormal LFTs (liver function tests) 09/12/2013  . Malignant neoplasm of upper-inner quadrant of right breast in female, estrogen receptor positive ([place]) 08/18/2013  . 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

## 2016-04-15 ENCOUNTER — Ambulatory Visit (HOSPITAL_BASED_OUTPATIENT_CLINIC_OR_DEPARTMENT_OTHER): Payer: 59 | Admitting: Oncology

## 2016-04-15 ENCOUNTER — Encounter: Payer: 59 | Admitting: Physical Therapy

## 2016-04-15 DIAGNOSIS — C771 Secondary and unspecified malignant neoplasm of intrathoracic lymph nodes: Secondary | ICD-10-CM

## 2016-04-15 DIAGNOSIS — Z170 Estrogen receptor positive status [ER+]: Secondary | ICD-10-CM | POA: Diagnosis not present

## 2016-04-15 DIAGNOSIS — C50211 Malignant neoplasm of upper-inner quadrant of right female breast: Secondary | ICD-10-CM

## 2016-04-15 DIAGNOSIS — C787 Secondary malignant neoplasm of liver and intrahepatic bile duct: Secondary | ICD-10-CM | POA: Diagnosis not present

## 2016-04-15 DIAGNOSIS — M8580 Other specified disorders of bone density and structure, unspecified site: Secondary | ICD-10-CM

## 2016-04-15 DIAGNOSIS — Z86718 Personal history of other venous thrombosis and embolism: Secondary | ICD-10-CM

## 2016-04-15 NOTE — Progress Notes (Signed)
[facility]  [phone]) [phone] [phone]) [phone]     ID: [name] [name] OB: 09-14-1960  MR#: [id number]  CBJ#:[id number]  PCP: Pcp Not In System GYN:  [name] SU:  OTHER MD: [name], [name], [name], [name], [name], Kyung Rudd   CHIEF COMPLAINT: Stage IV breast cancer  CURRENT TREATMENT: Fulvestrant, denosumab, palbociclib  INTERVAL HISTORY:   [name] [name] returns today for follow-up of her estrogen receptor positive breast cancer, accompanied by her husband [name] [name]. [name] [name]'s appointment was actually for tomorrow, but she removed it for today because that weather is expected.  She continues on palbociclib, currently at 100 mg daily. This is her week off. She "doesn't feel well". Her scalp hurts like her hair might fall off. She is nauseated. Today is a "bad body day" and she is having more pain in her pubic ramus. She took on Aleve for this which helped a little but did not clear her problems.  She is also having more epistaxis. Her nose is very dry. She has a little bit of bright red blood per rectum with her most recent bowel movement. For nausea she is using ondansetron but unfortunately this tends to constipate her. Quite aside from all this she is very worried about our restaging studies in a few weeks are going to show evidence of progression and what will be due if that is the case.  She is tolerating the fulvestrant and denosumab without any side effects other than the discomfort for of actual administration  REVIEW OF SYiSTEMS: Aside from the problems above a detailed review of systems today was stable  BREAST CANCER HISTORY: From doctor [name]'s intake note 03/20/2004:  "The patient is a very pleasant 56 year old female, without significant past medical history.  Her family history is significant for a sister who at age 25 was diagnosed with invasive ductal carcinoma.  She is a breast cancer  survivor at age 47 now.  The patient states that she has never really had a screening mammogram until October 2005, when she felt that it was time for her to start having mammograms done on a yearly basis.  Therefore, on 01/26/04, she underwent a screening mammogram and an abnormality was detected in the upper outer right breast.  She, therefore, underwent spot compression views of both the right and the left breast.  The left breast revealed a well-defined mass in the upper outer left quadrant, present at the 2 o'clock position, measuring 1.8 cm, 6 cm from the nipple.  This, by ultrasound, was felt to be a simple cyst measuring 1.8 cm.  On the right breast, a spiculated mass was noted in the upper outer right quadrant.  The ultrasound revealed a shadowing irregular solid mass at the 10:30 position, 9 cm from the nipple, measuring 1.2 cm in greatest dimension, correlating with the spiculated mass seen on the mammogram.  The right axilla was negative ultrasonically.  Because of this, the patient underwent a needle biopsy of the right breast and the biopsy was positive invasive mammary carcinoma that showed features consistent with a high-grade invasive ductal carcinoma associated with desmoplastic stroma.  No in situ component was seen and no definite lymphovascular invasion was identified.  On the core biopsy, the tumor measured about 0.8 cm.  Because of this, she was seen by Dr. [name] and the patient was taken to the [facility] on March 15, 2004.  She underwent a right breast lumpectomy with sentinel node biopsy.  The final pathology revealed an invasive ductal carcinoma, measuring 1.7 cm, grade 2 of 3.  Margins were free of tumor.  Atypical lobular hyperplasia was noted.  One sentinel node was removed which was negative for metastatic disease.  The tumor was staged at T1c, N0 MX.  It was estrogen receptor positive, progesterone receptor positive.  HER-2/neu was 2+.  FISH was negative.  All margins were  free of tumor.  She is now seen in Medical Oncology for further evaluation and management of this newly diagnosed T1c, node negative, stage I, invasive ductal carcinoma of the right breast."  Her subsequent history is as detailed below   PAST MEDICAL HISTORY: Past Medical History:  Diagnosis Date  . Bone metastases ([place]) dx'd 05/2014  . Breast cancer ([place]) dx'd 2005/2011  . Peripheral vascular disease ([place]) 02/2010   blood clot related to porta cath  . PONV (postoperative nausea and vomiting)   . S/P radiation therapy 07/17/2014 through 08/02/2014    Left mediastinum, left seventh rib 3250 cGy in 13 sessions   . S/P radiation therapy 12/11/2014 through 12/22/2014    Left parietal calvarium 2400 cGy in 8 sessions   . Seizures ([place]) 2010   Isolated incident.    PAST SURGICAL HISTORY: Past Surgical History:  Procedure Laterality Date  . AXILLARY LYMPH NODE DISSECTION  Dec. 2011  . BREAST LUMPECTOMY  2005  . MEDIASTINOTOMY CHAMBERLAIN MCNEIL Left 06/02/2013   Procedure: MEDIASTINOTOMY CHAMBERLAIN MCNEIL;  Surgeon: [name], MD;  Location: [facility];  Service: Thoracic;  Laterality: Left;  LEFT ANTERIOR MEDIASTINOTOMY   . PORTACATH PLACEMENT  12/11  . removal portacath      FAMILY HISTORY Family History  Problem Relation Age of Onset  . COPD Mother   . Breast cancer Sister 38   The patient's father is living, 38 years old as of may 2015. He lives in [place]. The patient's mother died from complications of COPD at the age of 66. These has 2 brothers, one sister. Her sister developed breast cancer at the age of 30. She is doing well. The patient herself underwent genetic testing at [facility] in 2011 and was found to be BRCA negative  GYNECOLOGIC HISTORY:  Menarche age 32, she is GX P0. She stopped having periods with her initial  chemotherapy in 2006.  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 Her husband [name] is a Medical illustrator in [place]. He has a child from a prior marriage. At home they have 2 rescue dogs, Hobo and Starks. The patient is religious but not a church attender    ADVANCED DIRECTIVES: In place; at the 08/04/2014 visit in particular the patient was very clear, with her husband present, that she would not want any kind of feeding tubes or "other tubes" if her condition deteriorated.   HEALTH MAINTENANCE: Social History  Substance Use Topics  . Smoking status: Never Smoker  . Smokeless tobacco: Never Used  . Alcohol use No     Colonoscopy:  PAP:  Bone density: March 2015; mild osteopenia  Lipid panel:  Allergies  Allergen Reactions  . 2nd Skin Quick Heal Other (See Comments)    Other Reaction: Skin peels  . Decadron [Dexamethasone] Other (See Comments)    Patient does not tolerate steroids.   . Dilaudid [Hydromorphone] Nausea And Vomiting  . Enoxaparin Other (See Comments)    unknown  . Fluconazole Swelling  Liver toxicity  . Hydromorphone Hcl Nausea And Vomiting  . Morphine And Related Nausea And Vomiting  . Protonix [Pantoprazole Sodium] Other (See Comments)    Patient reports it caused thrush.  . Tegaderm Ag Mesh [Silver]     Current Outpatient Prescriptions  Medication Sig Dispense Refill  . ALPRAZolam (XANAX) 0.5 MG tablet Take 1 tablet (0.5 mg total) by mouth 2 (two) times daily as needed for anxiety. 30 tablet 0  . B Complex-C (B-COMPLEX WITH VITAMIN C) tablet Take 1 tablet by mouth daily. Reported on 03/27/2015    . calcium carbonate (TUMS - DOSED IN MG ELEMENTAL CALCIUM) 500 MG chewable tablet Chew 1 tablet by mouth as directed.    . cholecalciferol 2000 UNITS tablet Take 1 tablet (2,000 Units total) by mouth daily.    . ciprofloxacin (CIPRO) 500 MG tablet Take 1 tablet (500 mg total) by mouth  2 (two) times daily. 6 tablet 0  . Diphenhyd-Hydrocort-Nystatin (FIRST-DUKES MOUTHWASH) SUSP 5-10 ml qid SWISH AND SPIT 419 mL 3  . folic acid (FOLVITE) 1 MG tablet Take 1 tablet (1 mg total) by mouth daily.    Marland Kitchen loratadine-pseudoephedrine (CLARITIN-D 24-HOUR) 10-240 MG 24 hr tablet Take 1 tablet by mouth daily.    . Melatonin 3 MG TABS Take 3 mg by mouth at bedtime.    . methylPREDNISolone (MEDROL) 4 MG tablet Take 1 tablet (15m) x3 per day for 1 day, then 1 tablet x2 per day for 1 day, then 1 tablet per day for 1 day, then discontinue. 6 tablet 0  . naproxen sodium (ANAPROX) 220 MG tablet Take 220 mg by mouth 2 (two) times daily with a meal.    . palbociclib (IBRANCE) 100 MG capsule Take 1 capsule (100 mg total) by mouth daily with breakfast. Take whole with food. 21 capsule 6  . phenazopyridine (PYRIDIUM) 200 MG tablet Take 1 tablet (200 mg total) by mouth 3 (three) times daily as needed for pain. 30 tablet 1  . RABEprazole Sodium 5 MG CPSP Take 5 mg by mouth daily. 30 capsule 3  . saccharomyces boulardii (FLORASTOR) 250 MG capsule Take 250 mg by mouth daily.      No current facility-administered medications for this visit.     OBJECTIVE: Middle-aged white woman Who appears stated age  V38     There is no height or weight on file to calculate BMI.   Patient refused vitals 04/16/16    ECOG FS: 1 Sclerae unicteric, pupils round and equal Oropharynx clear and moist-- no thrush or other lesions No cervical or supraclavicular adenopathy Lungs no rales or rhonchi Heart regular rate and rhythm Abd soft, nontender, positive bowel sounds MSK no focal spinal tenderness, no upper extremity lymphedema Neuro: nonfocal, well oriented, appropriate affect Breasts: Deferred  LAB RESULTS:   CMP     Component Value Date/Time   NA 135 (L) 04/07/2016 1136   K 4.0 04/07/2016 1136   CL 103 12/14/2014 0800   CL 105 05/06/2012 1333   CO2 26 04/07/2016 1136   GLUCOSE 81 04/07/2016 1136    GLUCOSE 124 (H) 05/06/2012 1333   BUN 10.8 04/07/2016 1136   CREATININE 0.9 04/07/2016 1136   CALCIUM 9.7 04/07/2016 1136   PROT 7.3 04/07/2016 1136   ALBUMIN 4.2 04/07/2016 1136   AST 29 04/07/2016 1136   ALT 32 04/07/2016 1136   ALKPHOS 79 04/07/2016 1136   BILITOT 0.35 04/07/2016 1136   GFRNONAA >60 12/14/2014 0800   GFRAA >60 12/14/2014 0800  No results found for: SPEP  Lab Results  Component Value Date   WBC 2.6 (L) 04/14/2016   NEUTROABS 1.7 04/14/2016   HGB 12.1 04/14/2016   HCT 34.2 (L) 04/14/2016   MCV 104.9 (H) 04/14/2016   PLT 177 04/14/2016      Chemistry      Component Value Date/Time   NA 135 (L) 04/07/2016 1136   K 4.0 04/07/2016 1136   CL 103 12/14/2014 0800   CL 105 05/06/2012 1333   CO2 26 04/07/2016 1136   BUN 10.8 04/07/2016 1136   CREATININE 0.9 04/07/2016 1136      Component Value Date/Time   CALCIUM 9.7 04/07/2016 1136   ALKPHOS 79 04/07/2016 1136   AST 29 04/07/2016 1136   ALT 32 04/07/2016 1136   BILITOT 0.35 04/07/2016 1136     No results for input(s): INR in the last 168 hours.  Urinalysis    Component Value Date/Time   COLORURINE YELLOW 11/17/2014 0143   APPEARANCEUR CLOUDY (A) 11/17/2014 0143   LABSPEC 1.005 01/02/2016 1127   PHURINE 7.0 01/02/2016 1127   PHURINE 6.5 11/17/2014 0143   GLUCOSEU Negative 01/02/2016 1127   HGBUR Trace 01/02/2016 1127   HGBUR NEGATIVE 11/17/2014 0143   BILIRUBINUR Negative 01/02/2016 1127   KETONESUR Negative 01/02/2016 1127   KETONESUR NEGATIVE 11/17/2014 0143   PROTEINUR Negative 01/02/2016 1127   PROTEINUR NEGATIVE 11/17/2014 0143   UROBILINOGEN 0.2 01/02/2016 1127   NITRITE Negative 01/02/2016 1127   NITRITE NEGATIVE 11/17/2014 0143   LEUKOCYTESUR Trace 01/02/2016 1127    Ref Range & Units 9d ago 50moago 224mogo    CA 27.29 0.0 - 38.6 U/mL 2,610.0   2,652.0CM   2,463.9CM    Comments: Specimen      STUDIES: No results found.  ASSESSMENT: [age].o. BRCA negative [place] woman  with stage IV breast cancer, history as follows  (1)  S/p Right upper inner quadrant lumpectomy and sentinel lymph node sampling 03/15/2004 for a pT1c pN0. Stage IA invasive ductal carcinoma, grade 2, estrogen receptor 95% positive, progesterone receptor 65% positive, HER-2 not amplified; additional surgery 04/25/2004 for seroma or clearance showed no residual tumor  (2) adjuvant chemotherapy with cyclophosphamide and doxorubicin every 21 days x4 completed 07/19/2004  (3) adjuvant [place]  (4) the patient opted against adjuvant antiestrogen therapy  (5) genetics testing showed no BRCA mutations  (6) biopsy of a palpable right axillary mass 10/24/2009 showed invasive ductal carcinoma, grade 3, estrogen receptor 100% positive, progesterone receptor 2% positive (alert score 5) HER-2 negative; no evidence of systemic disease on PET scanning  (7) completed 3 of 4 planned cycles of docetaxel and cyclophosphamide September 2011, fourth cycle omitted because of marked elevations in liver function tests  (8) an right axillary lymph node dissection 03/06/2010 showed 3/8 lymph nodes removed to be involved by tumor, with extracapsular extension.  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, TTS-1 negative), estrogen receptor 80% positive, progesterone receptor 2% positive, HER-2 not amplified  (12) letrozole started March 2015-- tolerated with significant side effects, discontinued at the end of May 2015  (13) PET  scan 08/16/2013 shows extensive left  pleural metastatic disease and a large left pleural effusion that shifts cardiac and mediastinal structures to the right; adenopathy (celiac trunk, periadrenal, periaortic); and a left medial clavicular lesion; Status post left thoracentesis 08/16/2013 positive for adenocarcinoma, estrogen receptor positive, progesterone receptor negative.  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; everolimus was suggested as an available targeted agent  (18) exemestane started 03/31/2014, discontinued 10/31/2014 with evidence of progression  (a) everolimus added 04/03/2014 but not tolerated (cytopenias, elevated LFTs) even at minimal doses; stopped 04/17/2014  (19) fulvestrant started 12/20/2014  (a) palbociclib added at very low dose 04/03/2015 (starting dose 75 mg weekly)  (b) palbociclib dose gradually increased to 75 mg daily, 21/7, as of May 2017  (c)  palbociclib dose increased to 100 mg daily, 21/ 7, beginning November mid- cycle  (20) liver biopsy 03/20/2015 confirms metastatic carcinoma, still estrogen receptor positive at 100%, progesterone receptor negative, HER-2 equivocal with a signals ratio 1.41, number per cell 4.50.   (a) repeat liver biopsy December 2017 might show further changes (HER-2 positivity)  (21) immunohistochemistry for mismatched repair protein mutations 03/20/2015 showed normal major and minor MMR proteins, with a very low probability of microsatellite instability ([id number])  (22) adjuvant radiation  12/24/15-01/02/16 Site/dose:   1) Left T9 Rib / 24 Gy in 8 fx                         2) Right inferior pelvis/ 24 Gy in 8 fx  (23) consider pembrolizumab (obtained compassionate release from company)    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 treated with Lovenox for 5-6 months  (c) right upper extremity lymphedema--receiving physical therapy  (d) hepatic steatosis with chronically elevated LFTs as well as unusual hepatic sensitivity to chemotherapy  (e) osteopenia with the lowest T score -1.6 on bone density scan 06/20/2013  (i) on denosumab/ Xgeva Q28d  (f) radiation oncology (Dr Valere Dross) has reviewed prior radiation records in case there is further mediastinal involvement with dysphagia etc in which case palliative XRT could be considered  (a) radiation to left mediastinum/ left 7th rib 3250 cGy in 13 sessions04/18/2016 through 08/02/2014  (b) radiation to T11 area: 22 Gy in 7 sessions, last dose 11/27/2014  (c) radiation left parietal scalp region to be completed 12/22/2014  (d) radiation to sacral area completed 04/09/2015  (e) radiation to right inferior pelvis and left ninth rib (24 gray, 12/24/2015--01/02/2016)  (f) T11 was treated stereotactically with 14 gray in 1 fraction 03/12/2016.  (g) chest wall and perineal pain--improved post radiation treatments  (a) discussed celebrex/ carafate but holding off for now  (h) zoster diagnosed 04/04/2015-- on valacyclovir   PLAN: We spent approximately 40 minutes today reviewing Ginnifer's symptoms. Basically she is making an enormous efforts to continue on the palbociclib at the current dose. I do believe many of the symptoms are related to the drug or to the cancer. Given all this effort it is discouraging to  her to think that probably or perhaps we are going to see disease progression. She is very concerned  because of the CA-27-29 results.  Actually I am encouraged by the CA-27-29 results. The numbers appear to be plateauing. It would not surprise me if we saw some decrease when we repeated in 3 weeks. As far as the upcoming scans I'm hoping for stable disease.  She understands that changes of a few millimeters here and there in measurable disease are hard to interpret. In studies we generally require at 20% change in any measurable disease before declaring progression or response. This is because of registration issues related to scans. After much discussion today we agreed we are going to proceed with one more cycle of palbociclib at the current dose. She will then be restaged with MRIs of the abdomen and pelvis. She will see me shortly after that (the films will be "over read" by Dr. Jobe Igo and at that point we will decide whether to continue on the current treatment, despite the many side effects that she is experiencing, switch to pembrolizumab, or consider yet other possibilities.  In the meantime I am encouraging her to continue with physical therapy as before. She knows to call for any other issues that may develop before her next visit.   Chauncey Cruel, MD   04/15/2016 2:01 PM

## 2016-04-16 ENCOUNTER — Ambulatory Visit: Payer: 59 | Admitting: Oncology

## 2016-04-18 ENCOUNTER — Ambulatory Visit: Payer: 59 | Admitting: Physical Therapy

## 2016-04-18 DIAGNOSIS — M79651 Pain in right thigh: Secondary | ICD-10-CM

## 2016-04-18 DIAGNOSIS — I890 Lymphedema, not elsewhere classified: Secondary | ICD-10-CM | POA: Diagnosis not present

## 2016-04-18 NOTE — Therapy (Signed)
[place], [place], [address] Phone: [phone]   Fax:  [phone]  Physical Therapy Treatment  Patient Details  Name: [name] [name] MRN: [id number] Date of Birth: October 26, 1960 Referring Provider: Dr. Kyung Rudd  Encounter Date: 04/18/2016      PT End of Session - 04/18/16 1233    Visit Number 7  6 for lymph   Number of Visits 25  for lymph   Date for PT Re-Evaluation 06/25/16  for lymph   Authorization - Number of Visits 54   PT Start Time 1019   PT Stop Time 1102   PT Time Calculation (min) 43 min   Activity Tolerance Patient tolerated treatment well   Behavior During Therapy [facility] for tasks assessed/performed      Past Medical History:  Diagnosis Date  . Bone metastases ([place]) dx'd 05/2014  . Breast cancer ([place]) dx'd 2005/2011  . Peripheral vascular disease ([place]) 02/2010   blood clot related to porta cath  . PONV (postoperative nausea and vomiting)   . S/P radiation therapy 07/17/2014 through 08/02/2014    Left mediastinum, left seventh rib 3250 cGy in 13 sessions   . S/P radiation therapy 12/11/2014 through 12/22/2014    Left parietal calvarium 2400 cGy in 8 sessions   . Seizures ([place]) 2010   Isolated incident.    Past Surgical History:  Procedure Laterality Date  . AXILLARY LYMPH NODE DISSECTION  Dec. 2011  . BREAST LUMPECTOMY  2005  . MEDIASTINOTOMY CHAMBERLAIN MCNEIL Left 06/02/2013   Procedure: MEDIASTINOTOMY CHAMBERLAIN MCNEIL;  Surgeon: [name], MD;  Location: [place];  Service: Thoracic;  Laterality: Left;  LEFT ANTERIOR MEDIASTINOTOMY   . PORTACATH PLACEMENT  12/11  . removal portacath      There were no vitals filed for this visit.      Subjective Assessment - 04/18/16 1022    Subjective "Dr. Virgie Dad  happy with my blood counts.  I'm not happy with my blood counts.  He thinks the CA 27/29 might be plateauing." Will have MRI on February 8th.   Currently in Pain? Yes   Pain Score 2    Pain Location Leg   Pain Orientation Right;Upper   Pain Descriptors / Indicators Other (Comment)  "just an awareness of it"   Pain Type Chronic pain   Aggravating Factors  lifting husband's heavy briefcase   Pain Relieving Factors unsure   Pain Score 6   Pain Location Axilla   Pain Orientation Right   Pain Descriptors / Indicators Other (Comment)  full   Pain Type Chronic pain   Aggravating Factors  more swelling   Pain Relieving Factors therapy                         [facility] Adult PT Treatment/Exercise - 04/18/16 0001      Manual Therapy   Myofascial Release right axilla with focus on scar tightness   Manual Lymphatic Drainage (MLD) In left sidelying, posterior interaxillary anastomosis and right axillo-inguinal anastomosis; in supine, short neck, left axilla and anterior interaxillary anastomosis, right groin and axillo-inguinal anastomosis, and right upper extremity from fingers to shoulder.  In right sidelying, left periscapular area toward left groin.   Other Manual Therapy soft tissue work at superior aspect of right medial and upper thigh in prone position for pain relief and muscle relaxation, gentle myofascial release at upper thigh, with releases today from far medial to lateral aspects of  posterior thigh                  PT Short Term Goals - 02/12/16 1226      PT SHORT TERM GOAL #1   Title pain with walking decreased >/= 25%   Time 4   Period Weeks   Status Achieved           PT Long Term Goals - 04/08/16 1239      PT LONG TERM GOAL #1   Title indpendent with HEP   Time 8   Period Weeks   Status On-going     PT LONG TERM GOAL #2   Title pain with walking decreased >/= 75%   Time 8   Period Weeks   Status --  not walking with cane, 40% better      PT LONG TERM GOAL #3   Title ability to flex her right hip with right groin pain decreased >/= 50% due to improved tissue mobility   Period Weeks   Status Achieved     PT LONG TERM GOAL #4   Title waking up in middle of night with pain decreased >/=50%   Time 8   Period Weeks   Status Achieved     PT LONG TERM GOAL #5   Title reduction of pain by end of day >/= 50% due to increase tissue mobility   Time 8   Period Weeks   Status On-going  40% better           Long Term Clinic Goals - 04/18/16 1236      CC Long Term Goal  #2   Title Pt. will report swelling is adequately managed to enable ADL function at a consistent level.   Status On-going     CC Long Term Goal  #4   Title Pain/discomfort at right axilla area will be controlled at 6/10 or less.   Status On-going     CC Long Term Goal  #5   Title Patient will avoid infection by ongoing management of her lymphedema at right breast/axilla/upper arm areas.   Status On-going     CC Long Term Goal  #6   Title Pain in right groin/ischial tuberosity area will be controlled at 3/10 level or less to enable walking with less pain.   Status On-going            Plan - 04/18/16 1235    Clinical Impression Statement Lymphedema issues not bad today.  Right upper thigh muscles felt significantly tight at both medial and posterior aspects of leg; continues to be helped by therapy.   Rehab Potential Good   Clinical Impairments Affecting Rehab Potential active cancer   PT Frequency 2x / week   PT Duration 12 weeks   PT Treatment/Interventions ADLs/Self Care Home Management;Therapeutic activities;Therapeutic exercise;Patient/family education;Manual techniques;Manual lymph drainage;Scar mobilization   PT Next Visit Plan continue manual lymph drainage, soft tissue work, and myofascial release   Consulted and Agree with Plan of Care Patient      Patient will benefit from skilled therapeutic intervention in order to improve  the following deficits and impairments:  Increased edema, Increased fascial restricitons, Pain  Visit Diagnosis: Pain in right thigh  Lymphedema, not elsewhere classified     Problem List Patient Active Problem List   Diagnosis Date Noted  . Liver metastases ([place]) 02/23/2016  . 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 Zoster 04/04/2015  . Nausea with vomiting 11/18/2014  . Constipation 11/18/2014  .  Left-sided thoracic back pain   . Bone metastases ([place]) 11/16/2014  . Back pain 11/15/2014  . Uncontrolled pain 11/14/2014  . Post-lymphadenectomy lymphedema of arm 05/31/2014  . Chest wall pain 03/21/2014  . Abnormal LFTs (liver function tests) 09/12/2013  . Malignant neoplasm of upper-inner quadrant of right breast in female, estrogen receptor positive ([place]) 08/18/2013  . 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

## 2016-04-21 ENCOUNTER — Encounter: Payer: Self-pay | Admitting: *Deleted

## 2016-04-21 ENCOUNTER — Ambulatory Visit: Payer: 59 | Admitting: Physical Therapy

## 2016-04-21 ENCOUNTER — Other Ambulatory Visit (HOSPITAL_BASED_OUTPATIENT_CLINIC_OR_DEPARTMENT_OTHER): Payer: 59

## 2016-04-21 DIAGNOSIS — I890 Lymphedema, not elsewhere classified: Secondary | ICD-10-CM | POA: Diagnosis not present

## 2016-04-21 DIAGNOSIS — C787 Secondary malignant neoplasm of liver and intrahepatic bile duct: Secondary | ICD-10-CM | POA: Diagnosis not present

## 2016-04-21 DIAGNOSIS — C7951 Secondary malignant neoplasm of bone: Secondary | ICD-10-CM | POA: Diagnosis not present

## 2016-04-21 DIAGNOSIS — C771 Secondary and unspecified malignant neoplasm of intrathoracic lymph nodes: Secondary | ICD-10-CM | POA: Diagnosis not present

## 2016-04-21 DIAGNOSIS — C50211 Malignant neoplasm of upper-inner quadrant of right female breast: Secondary | ICD-10-CM | POA: Diagnosis not present

## 2016-04-21 DIAGNOSIS — Z170 Estrogen receptor positive status [ER+]: Secondary | ICD-10-CM

## 2016-04-21 DIAGNOSIS — M79651 Pain in right thigh: Secondary | ICD-10-CM

## 2016-04-21 LAB — CBC WITH DIFFERENTIAL/PLATELET
BASO%: 2.6 % — AB (ref 0.0–2.0)
BASOS ABS: 0.1 10*3/uL (ref 0.0–0.1)
EOS%: 1.5 % (ref 0.0–7.0)
Eosinophils Absolute: 0 10*3/uL (ref 0.0–0.5)
HCT: 31.5 % — ABNORMAL LOW (ref 34.8–46.6)
HEMOGLOBIN: 11.4 g/dL — AB (ref 11.6–15.9)
LYMPH#: 0.6 10*3/uL — AB (ref 0.9–3.3)
LYMPH%: 22.4 % (ref 14.0–49.7)
MCH: 37.4 pg — ABNORMAL HIGH (ref 25.1–34.0)
MCHC: 36.2 g/dL — ABNORMAL HIGH (ref 31.5–36.0)
MCV: 103.3 fL — ABNORMAL HIGH (ref 79.5–101.0)
MONO#: 0.5 10*3/uL (ref 0.1–0.9)
MONO%: 18.4 % — AB (ref 0.0–14.0)
NEUT%: 55.1 % (ref 38.4–76.8)
NEUTROS ABS: 1.5 10*3/uL (ref 1.5–6.5)
Platelets: 214 10*3/uL (ref 145–400)
RBC: 3.05 10*6/uL — AB (ref 3.70–5.45)
RDW: 14 % (ref 11.2–14.5)
WBC: 2.7 10*3/uL — ABNORMAL LOW (ref 3.9–10.3)
nRBC: 0 % (ref 0–0)

## 2016-04-21 NOTE — Therapy (Signed)
[place], [place], [address] Phone: [phone]   Fax:  [phone]  Physical Therapy Treatment  Patient Details  Name: [name] [name] MRN: [id number] Date of Birth: 08/22/1960 Referring Provider: Dr. Kyung Rudd  Encounter Date: 04/21/2016      PT End of Session - 04/21/16 1243    Visit Number 8  7 for lymph   Number of Visits 25  for lymph   Date for PT Re-Evaluation 06/25/16  for lymph   Authorization - Number of Visits 60  for 2018   PT Start Time 1024   PT Stop Time 1104   PT Time Calculation (min) 40 min   Activity Tolerance Patient tolerated treatment well   Behavior During Therapy [facility] for tasks assessed/performed      Past Medical History:  Diagnosis Date  . Bone metastases ([place]) dx'd 05/2014  . Breast cancer ([place]) dx'd 2005/2011  . Peripheral vascular disease ([place]) 02/2010   blood clot related to porta cath  . PONV (postoperative nausea and vomiting)   . S/P radiation therapy 07/17/2014 through 08/02/2014    Left mediastinum, left seventh rib 3250 cGy in 13 sessions   . S/P radiation therapy 12/11/2014 through 12/22/2014    Left parietal calvarium 2400 cGy in 8 sessions   . Seizures ([place]) 2010   Isolated incident.    Past Surgical History:  Procedure Laterality Date  . AXILLARY LYMPH NODE DISSECTION  Dec. 2011  . BREAST LUMPECTOMY  2005  . MEDIASTINOTOMY CHAMBERLAIN MCNEIL Left 06/02/2013   Procedure: MEDIASTINOTOMY CHAMBERLAIN MCNEIL;  Surgeon: [name], MD;  Location: [place];  Service: Thoracic;  Laterality: Left;  LEFT ANTERIOR MEDIASTINOTOMY   . PORTACATH PLACEMENT  12/11  . removal portacath      There were no vitals filed for this visit.      Subjective Assessment - 04/21/16 1025    Subjective Not  really sure why, but the leg pain is worse.   Currently in Pain? Yes   Pain Score 5    Pain Location Leg   Pain Orientation Right;Upper   Pain Descriptors / Indicators Other (Comment)  "owie"   Aggravating Factors  lifting husband's heavy briefcase, walking   Pain Relieving Factors unsure   Pain Score 6   Pain Location Axilla   Pain Orientation Right                         [facility] Adult PT Treatment/Exercise - 04/21/16 0001      Manual Therapy   Myofascial Release right axilla with focus on scar tightness   Manual Lymphatic Drainage (MLD) In left sidelying, posterior interaxillary anastomosis and right axillo-inguinal anastomosis; in supine, short neck, left axilla and anterior interaxillary anastomosis, right groin and axillo-inguinal anastomosis, and right upper extremity from fingers to shoulder.  In right sidelying, left periscapular area toward left groin.   Other Manual Therapy soft tissue work at superior aspect of right medial and upper thigh in prone position for pain relief and muscle relaxation, gentle myofascial release at upper thigh, with releases today from far medial to lateral aspects of posterior thigh                  PT Short Term Goals - 02/12/16 1226      PT SHORT TERM GOAL #1   Title pain with walking decreased >/= 25%   Time 4   Period Weeks  Status Achieved           PT Long Term Goals - 04/08/16 1239      PT LONG TERM GOAL #1   Title indpendent with HEP   Time 8   Period Weeks   Status On-going     PT LONG TERM GOAL #2   Title pain with walking decreased >/= 75%   Time 8   Period Weeks   Status --  not walking with cane, 40% better     PT LONG TERM GOAL #3   Title ability to flex her right hip with right groin pain decreased >/= 50% due to improved tissue mobility   Period Weeks   Status Achieved     PT LONG TERM GOAL #4   Title waking up in middle of night with pain decreased >/=50%   Time 8   Period  Weeks   Status Achieved     PT LONG TERM GOAL #5   Title reduction of pain by end of day >/= 50% due to increase tissue mobility   Time 8   Period Weeks   Status On-going  40% better           Long Term Clinic Goals - 04/18/16 1236      CC Long Term Goal  #2   Title Pt. will report swelling is adequately managed to enable ADL function at a consistent level.   Status On-going     CC Long Term Goal  #4   Title Pain/discomfort at right axilla area will be controlled at 6/10 or less.   Status On-going     CC Long Term Goal  #5   Title Patient will avoid infection by ongoing management of her lymphedema at right breast/axilla/upper arm areas.   Status On-going     CC Long Term Goal  #6   Title Pain in right groin/ischial tuberosity area will be controlled at 3/10 level or less to enable walking with less pain.   Status On-going            Plan - 04/21/16 1244    Clinical Impression Statement Reporting increased leg pain today.  She is not sure of the cause of this, but therapy (myofascial release) to the area does help.   Rehab Potential Good   Clinical Impairments Affecting Rehab Potential active cancer   PT Frequency 2x / week   PT Duration 12 weeks   PT Treatment/Interventions ADLs/Self Care Home Management;Therapeutic activities;Therapeutic exercise;Patient/family education;Manual techniques;Manual lymph drainage;Scar mobilization   PT Next Visit Plan continue manual lymph drainage, soft tissue work, and myofascial release   Consulted and Agree with Plan of Care Patient      Patient will benefit from skilled therapeutic intervention in order to improve the following deficits and impairments:  Increased edema, Increased fascial restricitons, Pain  Visit Diagnosis: Pain in right thigh  Lymphedema, not elsewhere classified     Problem List Patient Active Problem List   Diagnosis Date Noted  . Liver metastases ([place]) 02/23/2016  . Malignant pleural effusion,  left 04/09/2015  . Zoster 04/04/2015  . Nausea with vomiting 11/18/2014  . Constipation 11/18/2014  . Left-sided thoracic back pain   . Bone metastases ([place]) 11/16/2014  . Back pain 11/15/2014  . Uncontrolled pain 11/14/2014  . Post-lymphadenectomy lymphedema of arm 05/31/2014  . Chest wall pain 03/21/2014  . Abnormal LFTs (liver function tests) 09/12/2013  . Malignant neoplasm of upper-inner quadrant of right breast in female, estrogen receptor  positive ([place]) 08/18/2013  . 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

## 2016-04-23 ENCOUNTER — Encounter: Payer: 59 | Admitting: Physical Therapy

## 2016-04-23 NOTE — Progress Notes (Signed)
  Radiation Oncology         [phone] ________________________________  Name: NONDAS MATURA MRN: [id number]  Date: 03/12/2016  DOB: [birth year]   SPECIAL TREATMENT PROCEDURE   3D TREATMENT PLANNING AND DOSIMETRY: The patient's radiation plan was reviewed and approved by Dr. Elenor Legato from neurosurgery and radiation oncology prior to treatment. It showed 3-dimensional radiation distributions overlaid onto the planning CT/MRI image set. The [facility] for the target structures as well as the organs at risk were reviewed. The documentation of the 3D plan and dosimetry are filed in the radiation oncology EMR.   NARRATIVE: The patient was brought to the TrueBeam stereotactic radiation treatment machine and placed supine on the CT couch. The patient was placed in the BodyFix bag to set up the patient for stereotactic radiosurgery and vacuum immobilization was applied. Neurosurgery was present for the set-up and delivery   SIMULATION VERIFICATION: In the couch zero-angle position, the patient underwent Exactrac imaging using the Brainlab system with orthogonal KV images. These were carefully aligned and repeated to confirm treatment position for each of the isocenters. The Exactrac snap film verification was repeated at each couch angle.   SPECIAL TREATMENT PROCEDURE: The patient received stereotactic radiosurgery to the following targets:  T11 PTV target was treated using 4 Arcs to a prescription dose of 14 Gy. ExacTrac Snap verification was performed for each couch angle.   STEREOTACTIC TREATMENT MANAGEMENT: Following delivery, the patient was transported to nursing in stable condition and monitored for possible acute effects. Vital signs were recorded . The patient tolerated treatment without significant acute effects, and was discharged to home in stable condition.  PLAN: Follow-up in one month.   ------------------------------------------------  [name] Gross, MD, PhD

## 2016-04-23 NOTE — Progress Notes (Signed)
  Radiation Oncology         [phone] ________________________________  Name: [name] [name] MRN: [id number]  Date: 03/05/2016  DOB: Jul 27, 1960  SIMULATION AND TREATMENT PLANNING NOTE  DIAGNOSIS:     ICD-9-CM ICD-10-CM   1. Bone metastases ([place]) 198.5 C79.51     Site:  Spine at the T11 level  NARRATIVE:  The patient was brought to the [facility].  Identity was confirmed.  All relevant records and images related to the planned course of therapy were reviewed.   Written consent to proceed with treatment was confirmed which was freely given after reviewing the details related to the planned course of therapy had been reviewed with the patient.  Then, the patient was set-up in a stable reproducible supine position for radiation therapy.  CT images were obtained.  Surface markings were placed.    Medically necessary complex treatment device(s) for immobilization:  1.  customized body-fix device, 2.  customized accuform device.   The CT images were loaded into the planning software.  Then the target and avoidance structures were contoured.  Treatment planning then occurred.  The radiation prescription was entered and confirmed.   The patient will receive a course of stereotactic radiosurgery to the spine. I am therefore requesting a 3-D conformal technique. The target volume has been contoured in addition to the spinal cord and surrounding critical at risk organs.  Dose volume histograms of each of these structures will be carefully reviewed as part of the 3-D conformal technique.   PLAN:  The patient will receive 14 Gy in 1 fraction(s).  ________________________________   [name] Gross, MD, PhD

## 2016-04-24 ENCOUNTER — Ambulatory Visit: Payer: 59 | Admitting: Physical Therapy

## 2016-04-24 ENCOUNTER — Encounter: Payer: Self-pay | Admitting: Physical Therapy

## 2016-04-24 DIAGNOSIS — M62838 Other muscle spasm: Secondary | ICD-10-CM | POA: Diagnosis not present

## 2016-04-24 DIAGNOSIS — M25551 Pain in right hip: Secondary | ICD-10-CM

## 2016-04-24 NOTE — Addendum Note (Signed)
Encounter addended by: Consuella Lose, MD on: 04/24/2016 11:26 AM<BR>    Actions taken: Sign clinical note

## 2016-04-24 NOTE — Therapy (Signed)
[facility] [address], [place], [place], [address] Phone: [phone]   Fax:  [phone]  Physical Therapy Treatment  Patient Details  Name: [name] [name] MRN: [id number] Date of Birth: [birth year] Referring Provider: Dr. Kyung Rudd  Encounter Date: 04/24/2016      PT End of Session - 04/24/16 1058    Visit Number 9   Number of Visits 25   Date for PT Re-Evaluation 05/06/16   PT Start Time 1010   PT Stop Time 1051   PT Time Calculation (min) 41 min   Activity Tolerance Patient tolerated treatment well   Behavior During Therapy [facility] for tasks assessed/performed      Past Medical History:  Diagnosis Date  . Bone metastases ([place]) dx'd 05/2014  . Breast cancer ([place]) dx'd 2005/2011  . Peripheral vascular disease ([place]) 02/2010   blood clot related to porta cath  . PONV (postoperative nausea and vomiting)   . S/P radiation therapy 07/17/2014 through 08/02/2014    Left mediastinum, left seventh rib 3250 cGy in 13 sessions   . S/P radiation therapy 12/11/2014 through 12/22/2014    Left parietal calvarium 2400 cGy in 8 sessions   . Seizures ([place]) 2010   Isolated incident.    Past Surgical History:  Procedure Laterality Date  . AXILLARY LYMPH NODE DISSECTION  Dec. 2011  . BREAST LUMPECTOMY  2005  . MEDIASTINOTOMY CHAMBERLAIN MCNEIL Left 06/02/2013   Procedure: MEDIASTINOTOMY CHAMBERLAIN MCNEIL;  Surgeon: [name], MD;  Location: [place];  Service: Thoracic;  Laterality: Left;  LEFT ANTERIOR MEDIASTINOTOMY   . PORTACATH PLACEMENT  12/11  . removal portacath      There were no vitals filed for this visit.      Subjective Assessment - 04/24/16 1014    Subjective I am having so much pain that I need it reduced so I can tolerate it. I will have a  MRI 05/08/2016.    Limitations Writing   How long can you walk comfortably? more she walks patient will have increased pain that stops her from walking further   Diagnostic tests x rays of back show no fractures;    Patient Stated Goals walk 20 minutes with minimal limp   Currently in Pain? Yes   Pain Score 4    Pain Location Groin  band around    Pain Orientation Right   Pain Type Chronic pain   Pain Onset More than a month ago   Pain Frequency Constant   Aggravating Factors  walking   Pain Relieving Factors massage   Effect of Pain on Daily Activities walking   Multiple Pain Sites No                      Pelvic Floor Special Questions - 04/24/16 0001    Pelvic Floor Internal Exam Patient approves physical therapist to perform pelvic floor muscle assessment   Exam Type Vaginal           [facility] Adult PT Treatment/Exercise - 04/24/16 0001      Manual Therapy   Manual Therapy Soft tissue mobilization;Internal Pelvic Floor   Soft tissue mobilization right hamstring, hip adductore, gluteus medius, pirirofrmis, lateral right hip, gluteals   Internal Pelvic Floor gentle soft tissue work to rightside of puborectalis, pubococcygeus, ischiococcygeus                PT Education - 04/24/16 1058    Education provided No  PT Short Term Goals - 02/12/16 1226      PT SHORT TERM GOAL #1   Title pain with walking decreased >/= 25%   Time 4   Period Weeks   Status Achieved           PT Long Term Goals - 04/24/16 1102      PT LONG TERM GOAL #1   Title indpendent with HEP   Time 8   Period Weeks   Status On-going     PT LONG TERM GOAL #2   Title pain with walking decreased >/= 75%   Time 8   Period Weeks   Status On-going     PT LONG TERM GOAL #3   Title ability to flex her right hip with right groin pain decreased >/= 50% due to improved tissue mobility   Time 8   Period Weeks   Status Achieved     PT LONG TERM GOAL #5   Title  reduction of pain by end of day >/= 50% due to increase tissue mobility   Time 8   Period Weeks   Status On-going           Long Term Clinic Goals - 04/18/16 1236      CC Long Term Goal  #2   Title Pt. will report swelling is adequately managed to enable ADL function at a consistent level.   Status On-going     CC Long Term Goal  #4   Title Pain/discomfort at right axilla area will be controlled at 6/10 or less.   Status On-going     CC Long Term Goal  #5   Title Patient will avoid infection by ongoing management of her lymphedema at right breast/axilla/upper arm areas.   Status On-going     CC Long Term Goal  #6   Title Pain in right groin/ischial tuberosity area will be controlled at 3/10 level or less to enable walking with less pain.   Status On-going            Plan - 04/24/16 1059    Clinical Impression Statement After treatment pain decreased to 2/10 with standing on right leg. Patient was able to walk with bigger stride and greater ease.  Patient was not limping.  Patient benefit from skilled therapy to reduce her pain to improve her funciton.    Rehab Potential Good   Clinical Impairments Affecting Rehab Potential active cancer   PT Frequency 1x / week   PT Duration 8 weeks   PT Treatment/Interventions ADLs/Self Care Home Management;Therapeutic activities;Therapeutic exercise;Patient/family education;Manual techniques;Manual lymph drainage;Scar mobilization   PT Next Visit Plan soft tissue work, renewal    PT Home Exercise Plan progress as needed   Consulted and Agree with Plan of Care Patient      Patient will benefit from skilled therapeutic intervention in order to improve the following deficits and impairments:  Increased edema, Increased fascial restricitons, Pain  Visit Diagnosis: Other muscle spasm  Pain in right hip     Problem List Patient Active Problem List   Diagnosis Date Noted  . Liver metastases ([place]) 02/23/2016  . Malignant pleural  effusion, left 04/09/2015  . Zoster 04/04/2015  . Nausea with vomiting 11/18/2014  . Constipation 11/18/2014  . Left-sided thoracic back pain   . Bone metastases ([place]) 11/16/2014  . Back pain 11/15/2014  . Uncontrolled pain 11/14/2014  . Post-lymphadenectomy lymphedema of arm 05/31/2014  . Chest wall pain 03/21/2014  . Abnormal LFTs (liver  function tests) 09/12/2013  . Malignant neoplasm of upper-inner quadrant of right breast in female, estrogen receptor positive ([place]) 08/18/2013  . 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 [place], [place], [address] Phone: [phone]   Fax:  [phone]  Name: JINGER VELOSO MRN: [id number] Date of Birth: December 20, 1960

## 2016-04-24 NOTE — Op Note (Signed)
   Name: [name] [name]  MRN: [id number]  Date: 03/12/2016   DOB: [birth year]  Stereotactic Radiosurgery Operative Note  PRE-OPERATIVE DIAGNOSIS:  Spinal Metastasis, metastatic breast CA  POST-OPERATIVE DIAGNOSIS:  Spinal Metastasis, metastatic breast CA  PROCEDURE:  Stereotactic Radiosurgery  SURGEON:  Jairo Ben, MD  RADIATION ONCOLOGIST: Dr. Kyung Rudd, MD  NARRATIVE: The patient underwent a radiation treatment planning session in the radiation oncology simulation suite under the care of the radiation oncology physician and physicist.  I participated closely in the radiation treatment planning afterwards. The patient underwent planning CT myelogram which was fused to the MRI.  These images were fused on the planning system.  Radiation oncology contoured the gross target volume and subsequently expanded this to yield the Planning Target Volume. I actively participated in the planning process.  I helped to define and review the target contours and also the contours of the spinal cord, and selected nearby organs at risk.  All the dose constraints for critical structures were reviewed and compared to AAPM Task Group 101.  The prescription dose conformity was reviewed.  I approved the plan electronically.    Accordingly, [name] was brought to the TrueBeam stereotactic radiation treatment linac and placed in the custom immobilization device.  The patient was aligned according to the IR fiducial markers with BrainLab Exactrac, then orthogonal x-rays were used in ExacTrac with the 6DOF robotic table and the shifts were made to align the patient.  Then conebeam CT was performed to verify precision.  [name] received stereotactic radiosurgery to the following targets uneventfully:  T11 lesion to 14 Gy  The detailed description of the procedure is recorded in the radiation oncology procedure note.  I was present for the duration of the procedure.  DISPOSITION:   Following delivery, the patient was transported to nursing in stable condition and monitored for possible acute effects to be discharged to home in stable condition with follow-up in one month.  Consuella Lose, Loletha Grayer, MD 04/24/2016 11:24 AM

## 2016-04-25 ENCOUNTER — Ambulatory Visit: Payer: 59 | Admitting: Physical Therapy

## 2016-04-25 DIAGNOSIS — I890 Lymphedema, not elsewhere classified: Secondary | ICD-10-CM

## 2016-04-25 DIAGNOSIS — M79651 Pain in right thigh: Secondary | ICD-10-CM

## 2016-04-25 NOTE — Therapy (Signed)
[place], [place], [address] Phone: [phone]   Fax:  [phone]  Physical Therapy Treatment  Patient Details  Name: [name] [name] MRN: [id number] Date of Birth: 17-Oct-1960 Referring Provider: Dr. Kyung Rudd  Encounter Date: 04/25/2016      PT End of Session - 04/25/16 1214    Visit Number 10  8 for lymph   Number of Visits 25  for lymph   Date for PT Re-Evaluation 06/25/16   Authorization - Number of Visits 40   PT Start Time 1025   PT Stop Time 1105   PT Time Calculation (min) 40 min   Activity Tolerance Patient tolerated treatment well   Behavior During Therapy [facility] for tasks assessed/performed      Past Medical History:  Diagnosis Date  . Bone metastases ([place]) dx'd 05/2014  . Breast cancer ([place]) dx'd 2005/2011  . Peripheral vascular disease ([place]) 02/2010   blood clot related to porta cath  . PONV (postoperative nausea and vomiting)   . S/P radiation therapy 07/17/2014 through 08/02/2014    Left mediastinum, left seventh rib 3250 cGy in 13 sessions   . S/P radiation therapy 12/11/2014 through 12/22/2014    Left parietal calvarium 2400 cGy in 8 sessions   . Seizures ([place]) 2010   Isolated incident.    Past Surgical History:  Procedure Laterality Date  . AXILLARY LYMPH NODE DISSECTION  Dec. 2011  . BREAST LUMPECTOMY  2005  . MEDIASTINOTOMY CHAMBERLAIN MCNEIL Left 06/02/2013   Procedure: MEDIASTINOTOMY CHAMBERLAIN MCNEIL;  Surgeon: [name], MD;  Location: [facility];  Service: Thoracic;  Laterality: Left;  LEFT ANTERIOR MEDIASTINOTOMY   . PORTACATH PLACEMENT  12/11  . removal portacath      There were no vitals filed for this visit.      Subjective Assessment - 04/25/16 1028    Subjective "My mother-in-law fell  Tuesday night." Pt. reports feeling a lot of stress from that.   Currently in Pain? Yes   Pain Score 3    Pain Location Groin   Pain Orientation Right   Aggravating Factors  walking   Pain Relieving Factors therapy   Pain Score 6   Pain Location Axilla   Pain Orientation Right                         [facility] Adult PT Treatment/Exercise - 04/25/16 0001      Manual Therapy   Myofascial Release right axilla with focus on scar tightness   Manual Lymphatic Drainage (MLD) In left sidelying, posterior interaxillary anastomosis and right axillo-inguinal anastomosis; in supine, short neck, left axilla and anterior interaxillary anastomosis, right groin and axillo-inguinal anastomosis, and right upper extremity from fingers to shoulder.  In right sidelying, left periscapular area toward left groin.   Other Manual Therapy soft tissue work at superior aspect of right medial and upper thigh in prone position for pain relief and muscle relaxation, gentle myofascial release at upper thigh, with releases today from far medial to lateral aspects of posterior thigh                PT Education - 04/24/16 1058    Education provided No          PT Short Term Goals - 02/12/16 1226      PT SHORT TERM GOAL #1   Title pain with walking decreased >/= 25%   Time 4   Period Weeks  Status Achieved           PT Long Term Goals - 04/24/16 1102      PT LONG TERM GOAL #1   Title indpendent with HEP   Time 8   Period Weeks   Status On-going     PT LONG TERM GOAL #2   Title pain with walking decreased >/= 75%   Time 8   Period Weeks   Status On-going     PT LONG TERM GOAL #3   Title ability to flex her right hip with right groin pain decreased >/= 50% due to improved tissue mobility   Time 8   Period Weeks   Status Achieved     PT LONG TERM GOAL #5   Title reduction of pain by end of day >/= 50% due to increase tissue mobility   Time 8   Period Weeks   Status  On-going           Long Term Clinic Goals - 04/18/16 1236      CC Long Term Goal  #2   Title Pt. will report swelling is adequately managed to enable ADL function at a consistent level.   Status On-going     CC Long Term Goal  #4   Title Pain/discomfort at right axilla area will be controlled at 6/10 or less.   Status On-going     CC Long Term Goal  #5   Title Patient will avoid infection by ongoing management of her lymphedema at right breast/axilla/upper arm areas.   Status On-going     CC Long Term Goal  #6   Title Pain in right groin/ischial tuberosity area will be controlled at 3/10 level or less to enable walking with less pain.   Status On-going            Plan - 04/25/16 1215    Clinical Impression Statement Continues to benefit from myofascial release for right leg/groin pain and manual lymph drainage for swelling.   Rehab Potential Good   Clinical Impairments Affecting Rehab Potential active cancer   PT Frequency 2x / week   PT Duration 12 weeks   PT Treatment/Interventions ADLs/Self Care Home Management;Therapeutic activities;Therapeutic exercise;Patient/family education;Manual techniques;Manual lymph drainage;Scar mobilization   PT Next Visit Plan manual lymph drainage, myofascial release, and soft tissue work (for CA rehab)   Consulted and Agree with Plan of Care Patient      Patient will benefit from skilled therapeutic intervention in order to improve the following deficits and impairments:  Increased edema, Increased fascial restricitons, Pain  Visit Diagnosis: Pain in right thigh  Lymphedema, not elsewhere classified     Problem List Patient Active Problem List   Diagnosis Date Noted  . Liver metastases ([place]) 02/23/2016  . 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 Zoster 04/04/2015  . Nausea with vomiting 11/18/2014  . Constipation 11/18/2014  . Left-sided thoracic back pain   . Bone metastases ([place]) 11/16/2014  . Back pain  11/15/2014  . Uncontrolled pain 11/14/2014  . Post-lymphadenectomy lymphedema of arm 05/31/2014  . Chest wall pain 03/21/2014  . Abnormal LFTs (liver function tests) 09/12/2013  . Malignant neoplasm of upper-inner quadrant of right breast in female, estrogen receptor positive ([place]) 08/18/2013  . 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

## 2016-04-28 ENCOUNTER — Encounter: Payer: Self-pay | Admitting: *Deleted

## 2016-04-28 ENCOUNTER — Ambulatory Visit: Payer: 59 | Admitting: Physical Therapy

## 2016-04-28 ENCOUNTER — Encounter: Payer: Self-pay | Admitting: Oncology

## 2016-04-28 ENCOUNTER — Telehealth: Payer: Self-pay | Admitting: *Deleted

## 2016-04-28 ENCOUNTER — Other Ambulatory Visit (HOSPITAL_COMMUNITY)
Admission: RE | Admit: 2016-04-28 | Discharge: 2016-04-28 | Disposition: A | Discharge status: Home / Self Care | Payer: 59 | Source: Ambulatory Visit | Attending: Oncology | Admitting: Oncology

## 2016-04-28 ENCOUNTER — Other Ambulatory Visit: Payer: Self-pay | Admitting: *Deleted

## 2016-04-28 ENCOUNTER — Other Ambulatory Visit (HOSPITAL_BASED_OUTPATIENT_CLINIC_OR_DEPARTMENT_OTHER): Payer: 59

## 2016-04-28 DIAGNOSIS — R0989 Other specified symptoms and signs involving the circulatory and respiratory systems: Secondary | ICD-10-CM

## 2016-04-28 DIAGNOSIS — C50211 Malignant neoplasm of upper-inner quadrant of right female breast: Secondary | ICD-10-CM

## 2016-04-28 DIAGNOSIS — C771 Secondary and unspecified malignant neoplasm of intrathoracic lymph nodes: Secondary | ICD-10-CM

## 2016-04-28 DIAGNOSIS — R6889 Other general symptoms and signs: Secondary | ICD-10-CM

## 2016-04-28 DIAGNOSIS — C7951 Secondary malignant neoplasm of bone: Secondary | ICD-10-CM

## 2016-04-28 DIAGNOSIS — K1379 Other lesions of oral mucosa: Secondary | ICD-10-CM

## 2016-04-28 DIAGNOSIS — I890 Lymphedema, not elsewhere classified: Secondary | ICD-10-CM | POA: Diagnosis not present

## 2016-04-28 DIAGNOSIS — M79651 Pain in right thigh: Secondary | ICD-10-CM

## 2016-04-28 DIAGNOSIS — C787 Secondary malignant neoplasm of liver and intrahepatic bile duct: Secondary | ICD-10-CM | POA: Diagnosis not present

## 2016-04-28 DIAGNOSIS — J029 Acute pharyngitis, unspecified: Secondary | ICD-10-CM

## 2016-04-28 DIAGNOSIS — Z170 Estrogen receptor positive status [ER+]: Secondary | ICD-10-CM

## 2016-04-28 LAB — CBC WITH DIFFERENTIAL/PLATELET
BASO%: 3 % — AB (ref 0.0–2.0)
Basophils Absolute: 0.1 10*3/uL (ref 0.0–0.1)
EOS%: 1.9 % (ref 0.0–7.0)
Eosinophils Absolute: 0.1 10*3/uL (ref 0.0–0.5)
HCT: 32.5 % — ABNORMAL LOW (ref 34.8–46.6)
HEMOGLOBIN: 11.4 g/dL — AB (ref 11.6–15.9)
LYMPH#: 0.5 10*3/uL — AB (ref 0.9–3.3)
LYMPH%: 20.2 % (ref 14.0–49.7)
MCH: 36.9 pg — AB (ref 25.1–34.0)
MCHC: 35.1 g/dL (ref 31.5–36.0)
MCV: 105.2 fL — ABNORMAL HIGH (ref 79.5–101.0)
MONO#: 0.2 10*3/uL (ref 0.1–0.9)
MONO%: 7.9 % (ref 0.0–14.0)
NEUT#: 1.8 10*3/uL (ref 1.5–6.5)
NEUT%: 67 % (ref 38.4–76.8)
Platelets: 338 10*3/uL (ref 145–400)
RBC: 3.09 10*6/uL — ABNORMAL LOW (ref 3.70–5.45)
RDW: 13.9 % (ref 11.2–14.5)
WBC: 2.7 10*3/uL — ABNORMAL LOW (ref 3.9–10.3)
nRBC: 0 % (ref 0–0)

## 2016-04-28 LAB — RAPID STREP SCREEN (MED CTR MEBANE ONLY): Streptococcus, Group A Screen (Direct): NEGATIVE

## 2016-04-28 NOTE — Therapy (Signed)
[place], [place], [address] Phone: [phone]   Fax:  [phone]  Physical Therapy Treatment  Patient Details  Name: [name] [name] MRN: [id number] Date of Birth: 17-Mar-1961 Referring Provider: Dr. Kyung Rudd  Encounter Date: 04/28/2016      PT End of Session - 04/28/16 1249    Visit Number 11  9 for lymph   Number of Visits 25  for lymph   Date for PT Re-Evaluation 06/25/16   Authorization - Number of Visits 60   PT Start Time 1016   PT Stop Time 1104   PT Time Calculation (min) 48 min   Activity Tolerance Patient tolerated treatment well   Behavior During Therapy [facility] for tasks assessed/performed      Past Medical History:  Diagnosis Date  . Bone metastases ([place]) dx'd 05/2014  . Breast cancer ([place]) dx'd 2005/2011  . Peripheral vascular disease ([place]) 02/2010   blood clot related to porta cath  . PONV (postoperative nausea and vomiting)   . S/P radiation therapy 07/17/2014 through 08/02/2014    Left mediastinum, left seventh rib 3250 cGy in 13 sessions   . S/P radiation therapy 12/11/2014 through 12/22/2014    Left parietal calvarium 2400 cGy in 8 sessions   . Seizures ([place]) 2010   Isolated incident.    Past Surgical History:  Procedure Laterality Date  . AXILLARY LYMPH NODE DISSECTION  Dec. 2011  . BREAST LUMPECTOMY  2005  . MEDIASTINOTOMY CHAMBERLAIN MCNEIL Left 06/02/2013   Procedure: MEDIASTINOTOMY CHAMBERLAIN MCNEIL;  Surgeon: [name], MD;  Location: [facility];  Service: Thoracic;  Laterality: Left;  LEFT ANTERIOR MEDIASTINOTOMY   . PORTACATH PLACEMENT  12/11  . removal portacath      There were no vitals filed for this visit.      Subjective Assessment - 04/28/16 1016    Subjective Pt. is wearing a mask today:   "I don't know what's going on."  "I'm not running a fever, I don't feel clammy or chills."   Currently in Pain? Yes   Pain Score 2    Pain Location Groin   Pain Orientation Right   Pain Descriptors / Indicators Pressure   Pain Score 6   Pain Location Axilla   Pain Orientation Right   Pain Descriptors / Indicators Other (Comment)  full   Pain Score 6  0-6   Pain Location Rib cage   Pain Orientation Left;Mid   Pain Descriptors / Indicators --  "it comes up and it yaps at me"   Pain Frequency Intermittent                         [facility] Adult PT Treatment/Exercise - 04/28/16 0001      Manual Therapy   Myofascial Release right axilla with focus on scar tightness   Manual Lymphatic Drainage (MLD) In left sidelying, posterior interaxillary anastomosis and right axillo-inguinal anastomosis; in supine, short neck, left axilla and anterior interaxillary anastomosis, right groin and axillo-inguinal anastomosis, and right upper extremity from fingers to shoulder.  In right sidelying, left periscapular area toward left groin.   Other Manual Therapy soft tissue work at superior aspect of right medial and upper thigh in prone position for pain relief and muscle relaxation, gentle myofascial release at upper thigh, with releases today from far medial to lateral aspects of posterior thigh; extra time on pulling at upper thigh today  PT Short Term Goals - 02/12/16 1226      PT SHORT TERM GOAL #1   Title pain with walking decreased >/= 25%   Time 4   Period Weeks   Status Achieved           PT Long Term Goals - 04/24/16 1102      PT LONG TERM GOAL #1   Title indpendent with HEP   Time 8   Period Weeks   Status On-going     PT LONG TERM GOAL #2   Title pain with walking decreased >/= 75%   Time 8   Period Weeks   Status On-going     PT LONG TERM GOAL #3   Title ability to flex her right hip with right groin pain decreased >/= 50% due to  improved tissue mobility   Time 8   Period Weeks   Status Achieved     PT LONG TERM GOAL #5   Title reduction of pain by end of day >/= 50% due to increase tissue mobility   Time 8   Period Weeks   Status On-going           Long Term Clinic Goals - 04/18/16 1236      CC Long Term Goal  #2   Title Pt. will report swelling is adequately managed to enable ADL function at a consistent level.   Status On-going     CC Long Term Goal  #4   Title Pain/discomfort at right axilla area will be controlled at 6/10 or less.   Status On-going     CC Long Term Goal  #5   Title Patient will avoid infection by ongoing management of her lymphedema at right breast/axilla/upper arm areas.   Status On-going     CC Long Term Goal  #6   Title Pain in right groin/ischial tuberosity area will be controlled at 3/10 level or less to enable walking with less pain.   Status On-going            Plan - 04/28/16 1250    Clinical Impression Statement Continues to report decrease in right thigh discomfort at end of session.   Rehab Potential Good   Clinical Impairments Affecting Rehab Potential active cancer   PT Frequency 2x / week   PT Duration 12 weeks   PT Treatment/Interventions ADLs/Self Care Home Management;Therapeutic activities;Therapeutic exercise;Patient/family education;Manual techniques;Manual lymph drainage;Scar mobilization   PT Next Visit Plan manual lymph drainage, myofascial release, and soft tissue work (for CA rehab)   Consulted and Agree with Plan of Care Patient      Patient will benefit from skilled therapeutic intervention in order to improve the following deficits and impairments:  Increased edema, Increased fascial restricitons, Pain  Visit Diagnosis: Pain in right thigh  Lymphedema, not elsewhere classified     Problem List Patient Active Problem List   Diagnosis Date Noted  . Liver metastases ([place]) 02/23/2016  . Malignant pleural effusion, left 04/09/2015   . Zoster 04/04/2015  . Nausea with vomiting 11/18/2014  . Constipation 11/18/2014  . Left-sided thoracic back pain   . Bone metastases ([place]) 11/16/2014  . Back pain 11/15/2014  . Uncontrolled pain 11/14/2014  . Post-lymphadenectomy lymphedema of arm 05/31/2014  . Chest wall pain 03/21/2014  . Abnormal LFTs (liver function tests) 09/12/2013  . Malignant neoplasm of upper-inner quadrant of right breast in female, estrogen receptor positive ([place]) 08/18/2013  . 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

## 2016-04-28 NOTE — Telephone Encounter (Signed)
This RN spoke with pt per her call and lab visit per her concerns with new area of mouth sore and noted " throat changes " concerning for strep throat. [name] [name] states her usual presentation related to " as a child whenever I would come down with Strep throat it always started with mouth bumps and my glands would swell "  Per RN assessment noted one small ( pin head ) closed pimple in back left mucosal area. Orifice of throat with increased redness. No phlegm or pus noted.  Temp is 98.  Per pt's concern and current use of Ibrance - rapid strep culture obtained.

## 2016-04-29 ENCOUNTER — Encounter: Payer: Self-pay | Admitting: Physical Therapy

## 2016-04-29 ENCOUNTER — Ambulatory Visit: Payer: 59 | Admitting: Physical Therapy

## 2016-04-29 ENCOUNTER — Telehealth: Payer: Self-pay | Admitting: *Deleted

## 2016-04-29 DIAGNOSIS — M62838 Other muscle spasm: Secondary | ICD-10-CM | POA: Diagnosis not present

## 2016-04-29 DIAGNOSIS — M79651 Pain in right thigh: Secondary | ICD-10-CM

## 2016-04-29 NOTE — Therapy (Signed)
[facility] [address], [address] [place], [place], [address] Phone: [phone]   Fax:  [phone]  Physical Therapy Treatment  Patient Details  Name: [name] [name] MRN: [id number] Date of Birth: [birth year] Referring Provider: Dr. Dorothy Puffer  Encounter Date: 04/29/2016      PT End of Session - 04/29/16 1357    Visit Number 12   Date for PT Re-Evaluation 09/03/16   PT Start Time 1230   PT Stop Time 1310   PT Time Calculation (min) 40 min   Activity Tolerance Patient tolerated treatment well   Behavior During Therapy [facility] for tasks assessed/performed      Past Medical History:  Diagnosis Date  . Bone metastases ([facility]) dx'd 05/2014  . Breast cancer ([facility]) dx'd 2005/2011  . Peripheral vascular disease ([facility]) 02/2010   blood clot related to porta cath  . PONV (postoperative nausea and vomiting)   . S/P radiation therapy 07/17/2014 through 08/02/2014    Left mediastinum, left seventh rib 3250 cGy in 13 sessions   . S/P radiation therapy 12/11/2014 through 12/22/2014    Left parietal calvarium 2400 cGy in 8 sessions   . Seizures ([facility]) 2010   Isolated incident.    Past Surgical History:  Procedure Laterality Date  . AXILLARY LYMPH NODE DISSECTION  Dec. 2011  . BREAST LUMPECTOMY  2005  . MEDIASTINOTOMY CHAMBERLAIN MCNEIL Left 06/02/2013   Procedure: MEDIASTINOTOMY CHAMBERLAIN MCNEIL;  Surgeon: Loreli Slot, MD;  Location: [facility];  Service: Thoracic;  Laterality: Left;  LEFT ANTERIOR MEDIASTINOTOMY   . PORTACATH PLACEMENT  12/11  . removal portacath      There were no vitals filed for this visit.      Subjective Assessment - 04/29/16 1233    Subjective  I have gotten the 144 visits approved. I am not feeling the sharp pain in the vaginal area this week. The  right inner thigh is tightening up this week. I have not been as active this week due to the weather. I get the MRI on 05/08/2016.   How long can you walk comfortably? more she walks patient will have increased pain that stops her from walking further   Diagnostic tests x rays of back show no fractures;    Patient Stated Goals walk 20 minutes with minimal limp   Currently in Pain? Yes   Pain Score 2    Pain Location Groin   Pain Orientation Right   Pain Descriptors / Indicators Tightness;Sore   Pain Type Chronic pain   Pain Onset More than a month ago   Pain Frequency Constant   Aggravating Factors  walking   Pain Relieving Factors therapy   Effect of Pain on Daily Activities walking   Multiple Pain Sites No            [facility] PT Assessment - 04/29/16 0001      Assessment   Medical Diagnosis Osseous Metastasis   Referring Provider Dr. Dorothy Puffer   Prior Therapy yes for the pelvic floor and lymphedema     Precautions   Precautions Other (comment)   Precaution Comments No ultrasound     Restrictions   Weight Bearing Restrictions No     Balance Screen   Has the patient fallen in the past 6 months No   Has the patient had a decrease in activity level because of a fear of falling?  No   Is the patient reluctant to leave their home because of a  fear of falling?  No     Home Ecologist residence     Prior Function   Level of Independence Independent     Cognition   Overall Cognitive Status Within Functional Limits for tasks assessed     Observation/Other Assessments   Focus on Therapeutic Outcomes (FOTO)  42% limitation     Posture/Postural Control   Posture/Postural Control No significant limitations     Strength   Right/Left Hip Right   Right Hip Flexion 4-/5   Right Hip Extension 4/5   Right Hip External Rotation  4+/5   Right Hip Internal Rotation 3+/5   Right Hip ABduction 3+/5   Right Hip ADduction 3/5     Palpation   SI  assessment  pelvis in correct alignment                  Pelvic Floor Special Questions - 04/29/16 0001    Pelvic Floor Internal Exam Patient approves physical therapist to perform pelvic floor muscle assessment   Exam Type Vaginal   Palpation vaginal canal is slightly bigger than the therapist right index finger   Strength strong squeeze, against strong resistance           [facility] Adult PT Treatment/Exercise - 04/29/16 0001      Manual Therapy   Manual Therapy Soft tissue mobilization;Internal Pelvic Floor   Soft tissue mobilization right hip adductor and quads   Internal Pelvic Floor gentle soft tissue work to rightside of puborectalis, pubococcygeus, ischiococcygeus                PT Education - 04/29/16 1357    Education provided No          PT Short Term Goals - 02/12/16 1226      PT SHORT TERM GOAL #1   Title pain with walking decreased >/= 25%   Time 4   Period Weeks   Status Achieved           PT Long Term Goals - 04/29/16 1401      PT LONG TERM GOAL #1   Title indpendent with HEP   Time 8   Period Weeks   Status On-going     PT LONG TERM GOAL #2   Title pain with walking decreased >/= 75%   Time 8   Period Weeks   Status On-going     PT LONG TERM GOAL #3   Title ability to flex her right hip with right groin pain decreased >/= 50% due to improved tissue mobility   Period Weeks   Status Achieved     PT LONG TERM GOAL #4   Title waking up in middle of night with pain decreased >/=50%   Time 8   Period Weeks   Status Achieved     PT LONG TERM GOAL #5   Title reduction of pain by end of day >/= 50% due to increase tissue mobility   Time 8   Period Weeks   Status On-going           Long Term Clinic Goals - 04/18/16 1236      CC Long Term Goal  #2   Title Pt. will report swelling is adequately managed to enable ADL function at a consistent level.   Status On-going     CC Long Term Goal  #4   Title  Pain/discomfort at right axilla area will be controlled at 6/10 or less.  Status On-going     CC Long Term Goal  #5   Title Patient will avoid infection by ongoing management of her lymphedema at right breast/axilla/upper arm areas.   Status On-going     CC Long Term Goal  #6   Title Pain in right groin/ischial tuberosity area will be controlled at 3/10 level or less to enable walking with less pain.   Status On-going            Plan - 04/29/16 1358    Rehab Potential Good   Clinical Impairments Affecting Rehab Potential active cancer   PT Frequency 2x / week   PT Duration Other (comment)  4 months   PT Treatment/Interventions ADLs/Self Care Home Management;Therapeutic activities;Therapeutic exercise;Patient/family education;Manual techniques;Manual lymph drainage;Scar mobilization   PT Next Visit Plan soft tissue work, isometric contraction   PT Home Exercise Plan progress as needed   Consulted and Agree with Plan of Care Patient   PT Plan Patient is not having the sharp pain she was having last week.  She is having tightness located in right hip adductors.  Patient vaginal canal is just bigger than the therapist index finge which is smaller than the initial evaluation.  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  Patient is having to reduce her walking due to the increased pain. Patient right hip is weaker due to her increase in pain in the past several weeks.  Patient has not met her goals at this time but is starting to progress due to reduction in sharp pain.       Patient will benefit from skilled therapeutic intervention in order to improve the following deficits and impairments:  Increased edema, Increased fascial restricitons, Pain  Visit Diagnosis: Pain in right thigh - Plan: PT plan of care cert/re-cert  Other muscle spasm - Plan: PT plan of care cert/re-cert     Problem List Patient Active  Problem List   Diagnosis Date Noted  . Liver metastases ([place]) 02/23/2016  . 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 Zoster 04/04/2015  . Nausea with vomiting 11/18/2014  . Constipation 11/18/2014  . Left-sided thoracic back pain   . Bone metastases ([place]) 11/16/2014  . Back pain 11/15/2014  . Uncontrolled pain 11/14/2014  . Post-lymphadenectomy lymphedema of arm 05/31/2014  . Chest wall pain 03/21/2014  . Abnormal LFTs (liver function tests) 09/12/2013  . Malignant neoplasm of upper-inner quadrant of right breast in female, estrogen receptor positive ([place]) 08/18/2013  . 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 [place], [place], [address] Phone: [phone]   Fax:  [phone]  Name: [name] [name] MRN: [id number] Date of Birth: 05/02/1960

## 2016-04-29 NOTE — Telephone Encounter (Signed)
This RN received call from [place] with [employer] - patient assistance -inquiring if pt will still be receiving Keytruda?  This RN informed Mariann Laster - goal is to use Keytruda when pt able to start.  Mariann Laster states pt's application is valid until 12/27/2016.  This office is to call Mariann Laster at [employer] when shipment of medication is needed.  Contact number given as [phone] - option 2 - request to speak to [place].  This note will be sent to Managed Care, Pharmacy and MD for communication.

## 2016-04-30 LAB — CULTURE, GROUP A STREP (THRC)

## 2016-05-02 ENCOUNTER — Ambulatory Visit: Payer: 59 | Attending: Oncology | Admitting: Physical Therapy

## 2016-05-02 DIAGNOSIS — I890 Lymphedema, not elsewhere classified: Secondary | ICD-10-CM | POA: Insufficient documentation

## 2016-05-02 DIAGNOSIS — M79651 Pain in right thigh: Secondary | ICD-10-CM | POA: Diagnosis not present

## 2016-05-02 NOTE — Therapy (Signed)
[place], [place], [address] Phone: [phone]   Fax:  [phone]  Physical Therapy Treatment  Patient Details  Name: [name] [name] MRN: [id number] Date of Birth: [birth year] Referring Provider: Dr. Kyung Rudd  Encounter Date: 05/02/2016      PT End of Session - 05/02/16 1107    Visit Number 13  10 for lymph   Number of Visits 25  for lymph   Date for PT Re-Evaluation 06/25/16  for lymph   Authorization - Number of Visits 140  for 2018   PT Start Time 1016   PT Stop Time 1103   PT Time Calculation (min) 47 min   Activity Tolerance Patient tolerated treatment well   Behavior During Therapy [facility] for tasks assessed/performed      Past Medical History:  Diagnosis Date  . Bone metastases ([place]) dx'd 05/2014  . Breast cancer ([place]) dx'd 2005/2011  . Peripheral vascular disease ([place]) 02/2010   blood clot related to porta cath  . PONV (postoperative nausea and vomiting)   . S/P radiation therapy 07/17/2014 through 08/02/2014    Left mediastinum, left seventh rib 3250 cGy in 13 sessions   . S/P radiation therapy 12/11/2014 through 12/22/2014    Left parietal calvarium 2400 cGy in 8 sessions   . Seizures ([place]) 2010   Isolated incident.    Past Surgical History:  Procedure Laterality Date  . AXILLARY LYMPH NODE DISSECTION  Dec. 2011  . BREAST LUMPECTOMY  2005  . MEDIASTINOTOMY CHAMBERLAIN MCNEIL Left 06/02/2013   Procedure: MEDIASTINOTOMY CHAMBERLAIN MCNEIL;  Surgeon: [name], MD;  Location: [place];  Service: Thoracic;  Laterality: Left;  LEFT ANTERIOR MEDIASTINOTOMY   . PORTACATH PLACEMENT  12/11  . removal portacath      There were no vitals filed for this visit.      Subjective Assessment - 05/02/16 1016    Subjective "Is  it just me or is everybody tired this week?"   Currently in Pain? Yes   Pain Score 2    Pain Location Leg   Pain Orientation Right;Upper   Pain Descriptors / Indicators Tightness   Pain Type Chronic pain   Aggravating Factors  walking   Pain Relieving Factors therapy   Pain Score 6   Pain Location Axilla   Pain Orientation Right   Pain Descriptors / Indicators Other (Comment)  hurting, numb, heavy   Pain Type Chronic pain   Aggravating Factors  more swelling   Pain Relieving Factors therapy                         [facility] Adult PT Treatment/Exercise - 05/02/16 0001      Manual Therapy   Myofascial Release right axilla with focus on scar tightness   Manual Lymphatic Drainage (MLD) In left sidelying, posterior interaxillary anastomosis and right axillo-inguinal anastomosis; in supine, short neck, left axilla and anterior interaxillary anastomosis, right groin and axillo-inguinal anastomosis, and right upper extremity from fingers to shoulder.  In right sidelying, left periscapular area toward left groin.   Other Manual Therapy soft tissue work at superior aspect of right medial and upper thigh in prone position for pain relief and muscle relaxation, gentle myofascial release at upper thigh, with releases today from far medial to lateral aspects of posterior thigh; extra time on pulling at upper thigh today  PT Short Term Goals - 02/12/16 1226      PT SHORT TERM GOAL #1   Title pain with walking decreased >/= 25%   Time 4   Period Weeks   Status Achieved           PT Long Term Goals - 04/29/16 1401      PT LONG TERM GOAL #1   Title indpendent with HEP   Time 8   Period Weeks   Status On-going     PT LONG TERM GOAL #2   Title pain with walking decreased >/= 75%   Time 8   Period Weeks   Status On-going     PT LONG TERM GOAL #3   Title ability to flex her right hip with right groin pain decreased >/= 50% due to improved  tissue mobility   Period Weeks   Status Achieved     PT LONG TERM GOAL #4   Title waking up in middle of night with pain decreased >/=50%   Time 8   Period Weeks   Status Achieved     PT LONG TERM GOAL #5   Title reduction of pain by end of day >/= 50% due to increase tissue mobility   Time 8   Period Weeks   Status On-going           Long Term Clinic Goals - 05/02/16 1112      CC Long Term Goal  #2   Title Pt. will report swelling is adequately managed to enable ADL function at a consistent level.   Status On-going     CC Long Term Goal  #4   Title Pain/discomfort at right axilla area will be controlled at 6/10 or less.   Status On-going     CC Long Term Goal  #5   Title Patient will avoid infection by ongoing management of her lymphedema at right breast/axilla/upper arm areas.   Status On-going     CC Long Term Goal  #6   Title Pain in right groin/ischial tuberosity area will be controlled at 3/10 level or less to enable walking with less pain.   Status On-going            Plan - 05/02/16 1109    Rehab Potential Good   Clinical Impairments Affecting Rehab Potential active cancer   PT Frequency 2x / week   PT Duration 12 weeks   PT Treatment/Interventions ADLs/Self Care Home Management;Therapeutic activities;Therapeutic exercise;Patient/family education;Manual techniques;Manual lymph drainage;Scar mobilization   PT Next Visit Plan manual lymph drainage, soft tissue work, myofascial release   Consulted and Agree with Plan of Care Patient   PT Plan Patient's right leg pain is a little less than it has been recently; right axilla pain stays generally in the 5-6 range.  Both benefit from therapy.  She reports she got the authorization from her insurance company for 140 visits this calendar year.      Patient will benefit from skilled therapeutic intervention in order to improve the following deficits and impairments:  Increased edema, Increased fascial  restricitons, Pain  Visit Diagnosis: Pain in right thigh  Lymphedema, not elsewhere classified     Problem List Patient Active Problem List   Diagnosis Date Noted  . Liver metastases ([place]) 02/23/2016  . 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 Zoster 04/04/2015  . Nausea with vomiting 11/18/2014  . Constipation 11/18/2014  . Left-sided thoracic back pain   . Bone metastases ([place]) 11/16/2014  .  Back pain 11/15/2014  . Uncontrolled pain 11/14/2014  . Post-lymphadenectomy lymphedema of arm 05/31/2014  . Chest wall pain 03/21/2014  . Abnormal LFTs (liver function tests) 09/12/2013  . Malignant neoplasm of upper-inner quadrant of right breast in female, estrogen receptor positive ([place]) 08/18/2013  . 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

## 2016-05-05 ENCOUNTER — Other Ambulatory Visit (HOSPITAL_BASED_OUTPATIENT_CLINIC_OR_DEPARTMENT_OTHER): Payer: 59

## 2016-05-05 ENCOUNTER — Ambulatory Visit: Payer: 59 | Admitting: Physical Therapy

## 2016-05-05 DIAGNOSIS — C787 Secondary malignant neoplasm of liver and intrahepatic bile duct: Secondary | ICD-10-CM

## 2016-05-05 DIAGNOSIS — C50211 Malignant neoplasm of upper-inner quadrant of right female breast: Secondary | ICD-10-CM

## 2016-05-05 DIAGNOSIS — M79651 Pain in right thigh: Secondary | ICD-10-CM

## 2016-05-05 DIAGNOSIS — C771 Secondary and unspecified malignant neoplasm of intrathoracic lymph nodes: Secondary | ICD-10-CM

## 2016-05-05 DIAGNOSIS — Z170 Estrogen receptor positive status [ER+]: Secondary | ICD-10-CM

## 2016-05-05 DIAGNOSIS — C7951 Secondary malignant neoplasm of bone: Secondary | ICD-10-CM

## 2016-05-05 DIAGNOSIS — I890 Lymphedema, not elsewhere classified: Secondary | ICD-10-CM

## 2016-05-05 LAB — COMPREHENSIVE METABOLIC PANEL
ALK PHOS: 80 U/L (ref 40–150)
ALT: 38 U/L (ref 0–55)
ANION GAP: 8 meq/L (ref 3–11)
AST: 32 U/L (ref 5–34)
Albumin: 4.2 g/dL (ref 3.5–5.0)
BUN: 8.7 mg/dL (ref 7.0–26.0)
CO2: 27 mEq/L (ref 22–29)
Calcium: 9.9 mg/dL (ref 8.4–10.4)
Chloride: 101 mEq/L (ref 98–109)
Creatinine: 0.9 mg/dL (ref 0.6–1.1)
EGFR: 75 mL/min/{1.73_m2} — AB (ref >90)
Glucose: 89 mg/dl (ref 70–140)
Potassium: 4.2 mEq/L (ref 3.5–5.1)
Sodium: 137 mEq/L (ref 136–145)
TOTAL PROTEIN: 7.5 g/dL (ref 6.4–8.3)
Total Bilirubin: 0.38 mg/dL (ref 0.20–1.20)

## 2016-05-05 LAB — CBC WITH DIFFERENTIAL/PLATELET
BASO%: 2.6 % — ABNORMAL HIGH (ref 0.0–2.0)
Basophils Absolute: 0.1 10*3/uL (ref 0.0–0.1)
EOS ABS: 0 10*3/uL (ref 0.0–0.5)
EOS%: 2.4 % (ref 0.0–7.0)
HCT: 33.6 % — ABNORMAL LOW (ref 34.8–46.6)
HGB: 11.8 g/dL (ref 11.6–15.9)
LYMPH%: 15.8 % (ref 14.0–49.7)
MCH: 38.6 pg — ABNORMAL HIGH (ref 25.1–34.0)
MCHC: 35 g/dL (ref 31.5–36.0)
MCV: 110 fL — AB (ref 79.5–101.0)
MONO#: 0.1 10*3/uL (ref 0.1–0.9)
MONO%: 6.2 % (ref 0.0–14.0)
NEUT%: 73 % (ref 38.4–76.8)
NEUTROS ABS: 1.5 10*3/uL (ref 1.5–6.5)
PLATELETS: 275 10*3/uL (ref 145–400)
RBC: 3.05 10*6/uL — AB (ref 3.70–5.45)
RDW: 15 % — ABNORMAL HIGH (ref 11.2–14.5)
WBC: 2 10*3/uL — AB (ref 3.9–10.3)
lymph#: 0.3 10*3/uL — ABNORMAL LOW (ref 0.9–3.3)

## 2016-05-05 NOTE — Therapy (Signed)
[place], [place], [address] Phone: [phone]   Fax:  [phone]  Physical Therapy Treatment  Patient Details  Name: [name] [name] MRN: [id number] Date of Birth: 1961/03/22 Referring Provider: Dr. Kyung Rudd  Encounter Date: 05/05/2016      PT End of Session - 05/05/16 1212    Visit Number 14  11 for lymph   Number of Visits 25  lymph   Date for PT Re-Evaluation 06/25/16  for lymph   Authorization - Number of Visits 140  for 2018   PT Start Time 1019   PT Stop Time 1102   PT Time Calculation (min) 43 min   Activity Tolerance Patient tolerated treatment well   Behavior During Therapy [facility] for tasks assessed/performed      Past Medical History:  Diagnosis Date  . Bone metastases ([place]) dx'd 05/2014  . Breast cancer ([place]) dx'd 2005/2011  . Peripheral vascular disease ([place]) 02/2010   blood clot related to porta cath  . PONV (postoperative nausea and vomiting)   . S/P radiation therapy 07/17/2014 through 08/02/2014    Left mediastinum, left seventh rib 3250 cGy in 13 sessions   . S/P radiation therapy 12/11/2014 through 12/22/2014    Left parietal calvarium 2400 cGy in 8 sessions   . Seizures ([place]) 2010   Isolated incident.    Past Surgical History:  Procedure Laterality Date  . AXILLARY LYMPH NODE DISSECTION  Dec. 2011  . BREAST LUMPECTOMY  2005  . MEDIASTINOTOMY CHAMBERLAIN MCNEIL Left 06/02/2013   Procedure: MEDIASTINOTOMY CHAMBERLAIN MCNEIL;  Surgeon: [name], MD;  Location: [facility];  Service: Thoracic;  Laterality: Left;  LEFT ANTERIOR MEDIASTINOTOMY   . PORTACATH PLACEMENT  12/11  . removal portacath      There were no vitals filed for this visit.      Subjective Assessment - 05/05/16 1020    Subjective "I  stayed up until 10:45 last night!"   Currently in Pain? Yes   Pain Score 3    Pain Location Leg   Pain Orientation Right;Upper   Pain Descriptors / Indicators Tightness   Pain Score 6   Pain Location Axilla   Pain Orientation Right   Pain Descriptors / Indicators Other (Comment)  really bad, hard   Aggravating Factors  more swelling   Pain Relieving Factors therapy                         [facility] Adult PT Treatment/Exercise - 05/05/16 0001      Manual Therapy   Myofascial Release right axilla with focus on scar tightness   Manual Lymphatic Drainage (MLD) In left sidelying, posterior interaxillary anastomosis and right axillo-inguinal anastomosis; in supine, short neck, left axilla and anterior interaxillary anastomosis, right groin and axillo-inguinal anastomosis, and right upper extremity from fingers to shoulder.  In right sidelying, left periscapular area toward left groin.   Other Manual Therapy soft tissue work at superior aspect of right medial and upper thigh in prone position for pain relief and muscle relaxation, gentle myofascial release at upper thigh, with releases today from far medial to lateral aspects of posterior thigh; extra time on pulling at upper thigh today                  PT Short Term Goals - 02/12/16 1226      PT SHORT TERM GOAL #1   Title pain with walking decreased >/= 25%  Time 4   Period Weeks   Status Achieved           PT Long Term Goals - 04/29/16 1401      PT LONG TERM GOAL #1   Title indpendent with HEP   Time 8   Period Weeks   Status On-going     PT LONG TERM GOAL #2   Title pain with walking decreased >/= 75%   Time 8   Period Weeks   Status On-going     PT LONG TERM GOAL #3   Title ability to flex her right hip with right groin pain decreased >/= 50% due to improved tissue mobility   Period Weeks   Status Achieved     PT LONG TERM GOAL #4   Title waking up in middle of night with pain decreased  >/=50%   Time 8   Period Weeks   Status Achieved     PT LONG TERM GOAL #5   Title reduction of pain by end of day >/= 50% due to increase tissue mobility   Time 8   Period Weeks   Status On-going           Long Term Clinic Goals - 05/02/16 1112      CC Long Term Goal  #2   Title Pt. will report swelling is adequately managed to enable ADL function at a consistent level.   Status On-going     CC Long Term Goal  #4   Title Pain/discomfort at right axilla area will be controlled at 6/10 or less.   Status On-going     CC Long Term Goal  #5   Title Patient will avoid infection by ongoing management of her lymphedema at right breast/axilla/upper arm areas.   Status On-going     CC Long Term Goal  #6   Title Pain in right groin/ischial tuberosity area will be controlled at 3/10 level or less to enable walking with less pain.   Status On-going            Plan - 05/05/16 1213    Rehab Potential Good   Clinical Impairments Affecting Rehab Potential active cancer   PT Frequency 2x / week   PT Duration 12 weeks   PT Treatment/Interventions ADLs/Self Care Home Management;Therapeutic activities;Therapeutic exercise;Patient/family education;Manual techniques;Manual lymph drainage;Scar mobilization   PT Next Visit Plan manual lymph drainage, soft tissue work, myofascial release   Consulted and Agree with Plan of Care Patient   PT Plan Patient continues to get significant benefit with reduction in discomfort from our sessions.      Patient will benefit from skilled therapeutic intervention in order to improve the following deficits and impairments:  Increased edema, Increased fascial restricitons, Pain  Visit Diagnosis: Pain in right thigh  Lymphedema, not elsewhere classified     Problem List Patient Active Problem List   Diagnosis Date Noted  . Liver metastases ([place]) 02/23/2016  . 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 Zoster 04/04/2015  . Nausea with  vomiting 11/18/2014  . Constipation 11/18/2014  . Left-sided thoracic back pain   . Bone metastases ([place]) 11/16/2014  . Back pain 11/15/2014  . Uncontrolled pain 11/14/2014  . Post-lymphadenectomy lymphedema of arm 05/31/2014  . Chest wall pain 03/21/2014  . Abnormal LFTs (liver function tests) 09/12/2013  . Malignant neoplasm of upper-inner quadrant of right breast in female, estrogen receptor positive ([place]) 08/18/2013  . 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

## 2016-05-06 ENCOUNTER — Ambulatory Visit: Payer: 59 | Attending: Radiation Oncology | Admitting: Physical Therapy

## 2016-05-06 ENCOUNTER — Encounter: Payer: Self-pay | Admitting: Physical Therapy

## 2016-05-06 DIAGNOSIS — M79651 Pain in right thigh: Secondary | ICD-10-CM | POA: Diagnosis present

## 2016-05-06 DIAGNOSIS — M25551 Pain in right hip: Secondary | ICD-10-CM

## 2016-05-06 DIAGNOSIS — M62838 Other muscle spasm: Secondary | ICD-10-CM | POA: Diagnosis present

## 2016-05-06 LAB — CANCER ANTIGEN 27.29

## 2016-05-06 NOTE — Therapy (Signed)
[facility] [address], [place], [place], [address] Phone: [phone]   Fax:  [phone]  Physical Therapy Treatment  Patient Details  Name: [name] [name] MRN: [id number] Date of Birth: 1960/07/06 Referring Provider: Dr. Kyung Rudd  Encounter Date: 05/06/2016      PT End of Session - 05/06/16 1238    Visit Number 15   Date for PT Re-Evaluation 06/25/16   Authorization - Visit Number [id number]   Authorization - Number of Visits 1310   Activity Tolerance Patient tolerated treatment well   Behavior During Therapy [facility] for tasks assessed/performed      Past Medical History:  Diagnosis Date  . Bone metastases ([place]) dx'd 05/2014  . Breast cancer ([place]) dx'd 2005/2011  . Peripheral vascular disease ([place]) 02/2010   blood clot related to porta cath  . PONV (postoperative nausea and vomiting)   . S/P radiation therapy 07/17/2014 through 08/02/2014    Left mediastinum, left seventh rib 3250 cGy in 13 sessions   . S/P radiation therapy 12/11/2014 through 12/22/2014    Left parietal calvarium 2400 cGy in 8 sessions   . Seizures ([place]) 2010   Isolated incident.    Past Surgical History:  Procedure Laterality Date  . AXILLARY LYMPH NODE DISSECTION  Dec. 2011  . BREAST LUMPECTOMY  2005  . MEDIASTINOTOMY CHAMBERLAIN MCNEIL Left 06/02/2013   Procedure: MEDIASTINOTOMY CHAMBERLAIN MCNEIL;  Surgeon: [name], MD;  Location: [facility];  Service: Thoracic;  Laterality: Left;  LEFT ANTERIOR MEDIASTINOTOMY   . PORTACATH PLACEMENT  12/11  . removal portacath      There were no vitals filed for this visit.      Subjective Assessment - 05/06/16 1236    Subjective When I have the soft tissue work internally the urine stream is better for 5 days. After the soft tissue work  the tightness is decreased by 3 days.  I have a pelvic MRI on 05/08/2016.    How long can you walk comfortably? more she walks patient will have increased pain that stops her from walking further   Diagnostic tests x rays of back show no fractures;    Patient Stated Goals walk 20 minutes with minimal limp   Currently in Pain? Yes   Pain Score 4    Pain Location Groin   Pain Orientation Right   Pain Descriptors / Indicators Tightness   Pain Type Chronic pain   Pain Onset More than a month ago   Pain Frequency Constant   Aggravating Factors  walking   Pain Relieving Factors soft tissue work   Multiple Pain Sites No                         [facility] Adult PT Treatment/Exercise - 05/06/16 0001      Manual Therapy   Manual Therapy Soft tissue mobilization;Internal Pelvic Floor   Soft tissue mobilization right hip adductor and quads   Internal Pelvic Floor gentle soft tissue work to rightside of puborectalis, pubococcygeus, ischiococcygeus                PT Education - 05/06/16 1308    Education provided No          PT Short Term Goals - 02/12/16 1226      PT SHORT TERM GOAL #1   Title pain with walking decreased >/= 25%   Time 4   Period Weeks   Status Achieved  PT Long Term Goals - 05/06/16 1239      PT LONG TERM GOAL #2   Title pain with walking decreased >/= 75%   Time 8   Period Weeks   Status On-going  50% after therapy but does not stay     PT LONG TERM GOAL #3   Title ability to flex her right hip with right groin pain decreased >/= 50% due to improved tissue mobility   Time 8   Period Weeks   Status Achieved     PT LONG TERM GOAL #4   Title waking up in middle of night with pain decreased >/=50%   Time 8   Period Weeks   Status Achieved     PT LONG TERM GOAL #5   Title reduction of pain by end of day >/= 50% due to increase tissue mobility   Time 8   Period Weeks   Status On-going  decreased by 50% after therapy  but does not stay           Long Term Clinic Goals - 05/02/16 1112      CC Long Term Goal  #2   Title Pt. will report swelling is adequately managed to enable ADL function at a consistent level.   Status On-going     CC Long Term Goal  #4   Title Pain/discomfort at right axilla area will be controlled at 6/10 or less.   Status On-going     CC Long Term Goal  #5   Title Patient will avoid infection by ongoing management of her lymphedema at right breast/axilla/upper arm areas.   Status On-going     CC Long Term Goal  #6   Title Pain in right groin/ischial tuberosity area will be controlled at 3/10 level or less to enable walking with less pain.   Status On-going            Plan - 05/06/16 1308    Rehab Potential Good   Clinical Impairments Affecting Rehab Potential active cancer   PT Frequency 2x / week   PT Duration 8 weeks   PT Next Visit Plan soft tissue work   PT Home Exercise Plan progress as needed   Consulted and Agree with Plan of Care Patient   PT Plan The tightness in right groin is 50% better after treatment. Pain level decreased to 1.5/10. Patient reports her urine stream is 50% better. Patient has a spot on right side of the vaginal canal is raised and painful.  Patient has less tightness located in posterior wall of vaginal.  Patient will benefit from skilled therapy to reduce pain and muscle tension.       Patient will benefit from skilled therapeutic intervention in order to improve the following deficits and impairments:  Increased edema, Increased fascial restricitons, Pain  Visit Diagnosis: Pain in right thigh  Other muscle spasm  Pain in right hip     Problem List Patient Active Problem List   Diagnosis Date Noted  . Liver metastases ([place]) 02/23/2016  . 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 Zoster 04/04/2015  . Nausea with vomiting 11/18/2014  . Constipation 11/18/2014  . Left-sided thoracic back pain   . Bone metastases  ([place]) 11/16/2014  . Back pain 11/15/2014  . Uncontrolled pain 11/14/2014  . Post-lymphadenectomy lymphedema of arm 05/31/2014  . Chest wall pain 03/21/2014  . Abnormal LFTs (liver function tests) 09/12/2013  . Malignant neoplasm of upper-inner quadrant of right breast in female,  estrogen receptor positive ([place]) 08/18/2013  . 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, [place], [place], [address] Phone: [phone]   Fax:  [phone]  Name: [name] [name] MRN: [id number] Date of Birth: April 24, 1960

## 2016-05-07 ENCOUNTER — Ambulatory Visit (HOSPITAL_BASED_OUTPATIENT_CLINIC_OR_DEPARTMENT_OTHER): Payer: 59

## 2016-05-07 VITALS — BP 119/79 | HR 85 | Temp 98.0°F | Resp 20

## 2016-05-07 DIAGNOSIS — C771 Secondary and unspecified malignant neoplasm of intrathoracic lymph nodes: Secondary | ICD-10-CM

## 2016-05-07 DIAGNOSIS — C7951 Secondary malignant neoplasm of bone: Secondary | ICD-10-CM

## 2016-05-07 DIAGNOSIS — C50211 Malignant neoplasm of upper-inner quadrant of right female breast: Secondary | ICD-10-CM

## 2016-05-07 DIAGNOSIS — Z5111 Encounter for antineoplastic chemotherapy: Secondary | ICD-10-CM | POA: Diagnosis not present

## 2016-05-07 MED ORDER — DENOSUMAB 120 MG/1.7ML ~~LOC~~ SOLN
120.0000 mg | Freq: Once | SUBCUTANEOUS | Status: AC
  Administered 2016-05-07: 120 mg via SUBCUTANEOUS
  Filled 2016-05-07: qty 1.7

## 2016-05-07 MED ORDER — FULVESTRANT 250 MG/5ML IM SOLN
500.0000 mg | Freq: Once | INTRAMUSCULAR | Status: AC
  Administered 2016-05-07: 500 mg via INTRAMUSCULAR
  Filled 2016-05-07: qty 10

## 2016-05-07 NOTE — Patient Instructions (Signed)
Denosumab injection What is this medicine? DENOSUMAB (den oh sue mab) slows bone breakdown. Prolia is used to treat osteoporosis in women after menopause and in men. Xgeva is used to prevent bone fractures and other bone problems caused by cancer bone metastases. Xgeva is also used to treat giant cell tumor of the bone. This medicine may be used for other purposes; ask your health care provider or pharmacist if you have questions. What should I tell my health care provider before I take this medicine? They need to know if you have any of these conditions: -dental disease -eczema -infection or history of infections -kidney disease or on dialysis -low blood calcium or vitamin D -malabsorption syndrome -scheduled to have surgery or tooth extraction -taking medicine that contains denosumab -thyroid or parathyroid disease -an unusual reaction to denosumab, other medicines, foods, dyes, or preservatives -pregnant or trying to get pregnant -breast-feeding How should I use this medicine? This medicine is for injection under the skin. It is given by a health care professional in a hospital or clinic setting. If you are getting Prolia, a special MedGuide will be given to you by the pharmacist with each prescription and refill. Be sure to read this information carefully each time. For Prolia, talk to your pediatrician regarding the use of this medicine in children. Special care may be needed. For Xgeva, talk to your pediatrician regarding the use of this medicine in children. While this drug may be prescribed for children as young as 13 years for selected conditions, precautions do apply. Overdosage: If you think you have taken too much of this medicine contact a poison control center or emergency room at once. NOTE: This medicine is only for you. Do not share this medicine with others. What if I miss a dose? It is important not to miss your dose. Call your doctor or health care professional if you are  unable to keep an appointment. What may interact with this medicine? Do not take this medicine with any of the following medications: -other medicines containing denosumab This medicine may also interact with the following medications: -medicines that suppress the immune system -medicines that treat cancer -steroid medicines like prednisone or cortisone This list may not describe all possible interactions. Give your health care provider a list of all the medicines, herbs, non-prescription drugs, or dietary supplements you use. Also tell them if you smoke, drink alcohol, or use illegal drugs. Some items may interact with your medicine. What should I watch for while using this medicine? Visit your doctor or health care professional for regular checks on your progress. Your doctor or health care professional may order blood tests and other tests to see how you are doing. Call your doctor or health care professional if you get a cold or other infection while receiving this medicine. Do not treat yourself. This medicine may decrease your body's ability to fight infection. You should make sure you get enough calcium and vitamin D while you are taking this medicine, unless your doctor tells you not to. Discuss the foods you eat and the vitamins you take with your health care professional. See your dentist regularly. Brush and floss your teeth as directed. Before you have any dental work done, tell your dentist you are receiving this medicine. Do not become pregnant while taking this medicine or for 5 months after stopping it. Women should inform their doctor if they wish to become pregnant or think they might be pregnant. There is a potential for serious side effects   to an unborn child. Talk to your health care professional or pharmacist for more information. What side effects may I notice from receiving this medicine? Side effects that you should report to your doctor or health care professional as soon as  possible: -allergic reactions like skin rash, itching or hives, swelling of the face, lips, or tongue -breathing problems -chest pain -fast, irregular heartbeat -feeling faint or lightheaded, falls -fever, chills, or any other sign of infection -muscle spasms, tightening, or twitches -numbness or tingling -skin blisters or bumps, or is dry, peels, or red -slow healing or unexplained pain in the mouth or jaw -unusual bleeding or bruising Side effects that usually do not require medical attention (Report these to your doctor or health care professional if they continue or are bothersome.): -muscle pain -stomach upset, gas This list may not describe all possible side effects. Call your doctor for medical advice about side effects. You may report side effects to FDA at 1-800-FDA-1088. Where should I keep my medicine? This medicine is only given in a clinic, doctor's office, or other health care setting and will not be stored at home. NOTE: This sheet is a summary. It may not cover all possible information. If you have questions about this medicine, talk to your doctor, pharmacist, or health care provider.    2016, Elsevier/Gold Standard. (2011-09-15 12:37:47) Fulvestrant injection What is this medicine? FULVESTRANT (ful VES trant) blocks the effects of estrogen. It is used to treat breast cancer. This medicine may be used for other purposes; ask your health care provider or pharmacist if you have questions. What should I tell my health care provider before I take this medicine? They need to know if you have any of these conditions: -bleeding problems -liver disease -low levels of platelets in the blood -an unusual or allergic reaction to fulvestrant, other medicines, foods, dyes, or preservatives -pregnant or trying to get pregnant -breast-feeding How should I use this medicine? This medicine is for injection into a muscle. It is usually given by a health care professional in a  hospital or clinic setting. Talk to your pediatrician regarding the use of this medicine in children. Special care may be needed. Overdosage: If you think you have taken too much of this medicine contact a poison control center or emergency room at once. NOTE: This medicine is only for you. Do not share this medicine with others. What if I miss a dose? It is important not to miss your dose. Call your doctor or health care professional if you are unable to keep an appointment. What may interact with this medicine? -medicines that treat or prevent blood clots like warfarin, enoxaparin, and dalteparin This list may not describe all possible interactions. Give your health care provider a list of all the medicines, herbs, non-prescription drugs, or dietary supplements you use. Also tell them if you smoke, drink alcohol, or use illegal drugs. Some items may interact with your medicine. What should I watch for while using this medicine? Your condition will be monitored carefully while you are receiving this medicine. You will need important blood work done while you are taking this medicine. Do not become pregnant while taking this medicine or for at least 1 year after stopping it. Women of child-bearing potential will need to have a negative pregnancy test before starting this medicine. Women should inform their doctor if they wish to become pregnant or think they might be pregnant. There is a potential for serious side effects to an unborn child. Men   should inform their doctors if they wish to father a child. This medicine may lower sperm counts. Talk to your health care professional or pharmacist for more information. Do not breast-feed an infant while taking this medicine or for 1 year after the last dose. What side effects may I notice from receiving this medicine? Side effects that you should report to your doctor or health care professional as soon as possible: -allergic reactions like skin rash,  itching or hives, swelling of the face, lips, or tongue -feeling faint or lightheaded, falls -pain, tingling, numbness, or weakness in the legs -signs and symptoms of infection like fever or chills; cough; flu-like symptoms; sore throat -vaginal bleeding Side effects that usually do not require medical attention (report to your doctor or health care professional if they continue or are bothersome): -aches, pains -constipation -diarrhea -headache -hot flashes -nausea, vomiting -pain at site where injected -stomach pain This list may not describe all possible side effects. Call your doctor for medical advice about side effects. You may report side effects to FDA at 1-800-FDA-1088. Where should I keep my medicine? This drug is given in a hospital or clinic and will not be stored at home. NOTE: This sheet is a summary. It may not cover all possible information. If you have questions about this medicine, talk to your doctor, pharmacist, or health care provider.    2016, Elsevier/Gold Standard. (2014-10-13 11:03:55)  

## 2016-05-08 ENCOUNTER — Ambulatory Visit (HOSPITAL_COMMUNITY): Admission: RE | Admit: 2016-05-08 | Payer: 59 | Source: Ambulatory Visit

## 2016-05-08 ENCOUNTER — Ambulatory Visit: Payer: 59

## 2016-05-08 ENCOUNTER — Ambulatory Visit (HOSPITAL_COMMUNITY)
Admission: RE | Admit: 2016-05-08 | Discharge: 2016-05-08 | Disposition: A | Discharge status: Home / Self Care | Payer: 59 | Source: Ambulatory Visit | Attending: Oncology | Admitting: Oncology

## 2016-05-08 DIAGNOSIS — C787 Secondary malignant neoplasm of liver and intrahepatic bile duct: Secondary | ICD-10-CM | POA: Diagnosis present

## 2016-05-08 DIAGNOSIS — C782 Secondary malignant neoplasm of pleura: Secondary | ICD-10-CM | POA: Insufficient documentation

## 2016-05-08 DIAGNOSIS — C50211 Malignant neoplasm of upper-inner quadrant of right female breast: Secondary | ICD-10-CM | POA: Diagnosis present

## 2016-05-08 DIAGNOSIS — C771 Secondary and unspecified malignant neoplasm of intrathoracic lymph nodes: Secondary | ICD-10-CM | POA: Diagnosis present

## 2016-05-08 DIAGNOSIS — C7951 Secondary malignant neoplasm of bone: Secondary | ICD-10-CM | POA: Diagnosis not present

## 2016-05-08 DIAGNOSIS — M4856XA Collapsed vertebra, not elsewhere classified, lumbar region, initial encounter for fracture: Secondary | ICD-10-CM | POA: Diagnosis not present

## 2016-05-08 DIAGNOSIS — Z170 Estrogen receptor positive status [ER+]: Secondary | ICD-10-CM | POA: Diagnosis not present

## 2016-05-08 DIAGNOSIS — R590 Localized enlarged lymph nodes: Secondary | ICD-10-CM | POA: Diagnosis not present

## 2016-05-08 DIAGNOSIS — K8020 Calculus of gallbladder without cholecystitis without obstruction: Secondary | ICD-10-CM | POA: Insufficient documentation

## 2016-05-08 MED ORDER — GADOBENATE DIMEGLUMINE 529 MG/ML IV SOLN
15.0000 mL | Freq: Once | INTRAVENOUS | Status: AC | PRN
  Administered 2016-05-08: 15 mL via INTRAVENOUS

## 2016-05-09 ENCOUNTER — Ambulatory Visit: Payer: 59 | Admitting: Physical Therapy

## 2016-05-09 DIAGNOSIS — I890 Lymphedema, not elsewhere classified: Secondary | ICD-10-CM

## 2016-05-09 DIAGNOSIS — M79651 Pain in right thigh: Secondary | ICD-10-CM

## 2016-05-09 NOTE — Therapy (Signed)
[place], [place], [address] Phone: [phone]   Fax:  [phone]  Physical Therapy Treatment  Patient Details  Name: [name] [name] MRN: [id number] Date of Birth: 05/20/1960 Referring Provider: Dr. Kyung Rudd  Encounter Date: 05/09/2016      PT End of Session - 05/09/16 1218    Visit Number 16  12 for lymph   Number of Visits 25  lymph   Date for PT Re-Evaluation 06/25/16  lymph   Authorization - Number of Visits 140  for 2018   PT Start Time 1020   PT Stop Time 1104   PT Time Calculation (min) 44 min   Activity Tolerance Patient tolerated treatment well   Behavior During Therapy [facility] for tasks assessed/performed      Past Medical History:  Diagnosis Date  . Bone metastases ([place]) dx'd 05/2014  . Breast cancer ([place]) dx'd 2005/2011  . Peripheral vascular disease ([place]) 02/2010   blood clot related to porta cath  . PONV (postoperative nausea and vomiting)   . S/P radiation therapy 07/17/2014 through 08/02/2014    Left mediastinum, left seventh rib 3250 cGy in 13 sessions   . S/P radiation therapy 12/11/2014 through 12/22/2014    Left parietal calvarium 2400 cGy in 8 sessions   . Seizures ([place]) 2010   Isolated incident.    Past Surgical History:  Procedure Laterality Date  . AXILLARY LYMPH NODE DISSECTION  Dec. 2011  . BREAST LUMPECTOMY  2005  . MEDIASTINOTOMY CHAMBERLAIN MCNEIL Left 06/02/2013   Procedure: MEDIASTINOTOMY CHAMBERLAIN MCNEIL;  Surgeon: [name], MD;  Location: [facility];  Service: Thoracic;  Laterality: Left;  LEFT ANTERIOR MEDIASTINOTOMY   . PORTACATH PLACEMENT  12/11  . removal portacath      There were no vitals filed for this visit.      Subjective Assessment - 05/09/16 1021    Subjective Low back got  sore from two hours in the MRI machine yesterday.   Currently in Pain? Yes   Pain Score 2    Pain Location Leg   Pain Orientation Right;Upper   Pain Descriptors / Indicators --  "buggy enough"   Pain Score 6   Pain Location Axilla   Pain Orientation Right   Pain Descriptors / Indicators Other (Comment)  full   Aggravating Factors  more swelling   Pain Relieving Factors therapy   Pain Score 2   Pain Location Back   Pain Orientation Lower   Pain Descriptors / Indicators --  out of whack from MRI machine                         [facility] Adult PT Treatment/Exercise - 05/09/16 0001      Manual Therapy   Myofascial Release right axilla with focus on scar tightness   Manual Lymphatic Drainage (MLD) In left sidelying, posterior interaxillary anastomosis and right axillo-inguinal anastomosis; in supine, short neck, left axilla and anterior interaxillary anastomosis, right groin and axillo-inguinal anastomosis, and right upper extremity from fingers to shoulder.  In right sidelying, left periscapular area toward left groin; extra time spent on the latter today.   Other Manual Therapy soft tissue work at superior aspect of right medial and upper thigh in prone position for pain relief and muscle relaxation, gentle myofascial release at upper thigh, with releases today from far medial to lateral aspects of posterior thigh; extra time on pulling at upper thigh today  PT Short Term Goals - 02/12/16 1226      PT SHORT TERM GOAL #1   Title pain with walking decreased >/= 25%   Time 4   Period Weeks   Status Achieved           PT Long Term Goals - 05/06/16 1239      PT LONG TERM GOAL #2   Title pain with walking decreased >/= 75%   Time 8   Period Weeks   Status On-going  50% after therapy but does not stay     PT LONG TERM GOAL #3   Title ability to flex her right hip with right groin pain decreased >/= 50% due to improved tissue mobility    Time 8   Period Weeks   Status Achieved     PT LONG TERM GOAL #4   Title waking up in middle of night with pain decreased >/=50%   Time 8   Period Weeks   Status Achieved     PT LONG TERM GOAL #5   Title reduction of pain by end of day >/= 50% due to increase tissue mobility   Time 8   Period Weeks   Status On-going  decreased by 50% after therapy but does not stay           Long Term Clinic Goals - 05/02/16 1112      CC Long Term Goal  #2   Title Pt. will report swelling is adequately managed to enable ADL function at a consistent level.   Status On-going     CC Long Term Goal  #4   Title Pain/discomfort at right axilla area will be controlled at 6/10 or less.   Status On-going     CC Long Term Goal  #5   Title Patient will avoid infection by ongoing management of her lymphedema at right breast/axilla/upper arm areas.   Status On-going     CC Long Term Goal  #6   Title Pain in right groin/ischial tuberosity area will be controlled at 3/10 level or less to enable walking with less pain.   Status On-going            Plan - 05/09/16 1219    Rehab Potential Good   Clinical Impairments Affecting Rehab Potential active cancer   PT Frequency 2x / week   PT Duration 8 weeks   PT Treatment/Interventions ADLs/Self Care Home Management;Therapeutic activities;Therapeutic exercise;Patient/family education;Manual techniques;Manual lymph drainage;Scar mobilization   PT Next Visit Plan For cancer rehab, manual lymph drainage, soft tissue work, and myofascial pulling   Consulted and Agree with Plan of Care Patient   PT Plan More back pain today from being in the MRI machine for a couple of hours yesterday. Pt. also had brief sharp pain at right axilla while therapist was doing soft tissue mobilization there today.      Patient will benefit from skilled therapeutic intervention in order to improve the following deficits and impairments:  Increased edema, Increased fascial  restricitons, Pain  Visit Diagnosis: Pain in right thigh  Lymphedema, not elsewhere classified     Problem List Patient Active Problem List   Diagnosis Date Noted  . Liver metastases ([place]) 02/23/2016  . 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 Zoster 04/04/2015  . Nausea with vomiting 11/18/2014  . Constipation 11/18/2014  . Left-sided thoracic back pain   . Bone metastases ([place]) 11/16/2014  . Back pain 11/15/2014  . Uncontrolled pain 11/14/2014  . Post-lymphadenectomy  lymphedema of arm 05/31/2014  . Chest wall pain 03/21/2014  . Abnormal LFTs (liver function tests) 09/12/2013  . Malignant neoplasm of upper-inner quadrant of right breast in female, estrogen receptor positive ([place]) 08/18/2013  . 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

## 2016-05-11 ENCOUNTER — Other Ambulatory Visit: Payer: Self-pay | Admitting: Oncology

## 2016-05-12 ENCOUNTER — Other Ambulatory Visit: Payer: Self-pay | Admitting: Oncology

## 2016-05-12 ENCOUNTER — Telehealth: Payer: Self-pay | Admitting: Oncology

## 2016-05-12 ENCOUNTER — Other Ambulatory Visit (HOSPITAL_BASED_OUTPATIENT_CLINIC_OR_DEPARTMENT_OTHER): Payer: 59

## 2016-05-12 ENCOUNTER — Other Ambulatory Visit: Payer: Self-pay | Admitting: *Deleted

## 2016-05-12 ENCOUNTER — Ambulatory Visit (HOSPITAL_BASED_OUTPATIENT_CLINIC_OR_DEPARTMENT_OTHER): Payer: 59 | Admitting: Oncology

## 2016-05-12 ENCOUNTER — Ambulatory Visit: Payer: 59 | Admitting: Physical Therapy

## 2016-05-12 DIAGNOSIS — C50211 Malignant neoplasm of upper-inner quadrant of right female breast: Secondary | ICD-10-CM | POA: Diagnosis not present

## 2016-05-12 DIAGNOSIS — M79651 Pain in right thigh: Secondary | ICD-10-CM | POA: Diagnosis not present

## 2016-05-12 DIAGNOSIS — C7802 Secondary malignant neoplasm of left lung: Secondary | ICD-10-CM

## 2016-05-12 DIAGNOSIS — C7951 Secondary malignant neoplasm of bone: Secondary | ICD-10-CM

## 2016-05-12 DIAGNOSIS — Z170 Estrogen receptor positive status [ER+]: Secondary | ICD-10-CM

## 2016-05-12 DIAGNOSIS — C787 Secondary malignant neoplasm of liver and intrahepatic bile duct: Secondary | ICD-10-CM

## 2016-05-12 DIAGNOSIS — R978 Other abnormal tumor markers: Secondary | ICD-10-CM

## 2016-05-12 DIAGNOSIS — C771 Secondary and unspecified malignant neoplasm of intrathoracic lymph nodes: Secondary | ICD-10-CM

## 2016-05-12 DIAGNOSIS — I890 Lymphedema, not elsewhere classified: Secondary | ICD-10-CM

## 2016-05-12 DIAGNOSIS — R935 Abnormal findings on diagnostic imaging of other abdominal regions, including retroperitoneum: Secondary | ICD-10-CM

## 2016-05-12 LAB — CBC WITH DIFFERENTIAL/PLATELET
BASO%: 2.4 % — ABNORMAL HIGH (ref 0.0–2.0)
BASOS ABS: 0.1 10*3/uL (ref 0.0–0.1)
EOS ABS: 0.1 10*3/uL (ref 0.0–0.5)
EOS%: 2.4 % (ref 0.0–7.0)
HCT: 31.1 % — ABNORMAL LOW (ref 34.8–46.6)
HGB: 11.1 g/dL — ABNORMAL LOW (ref 11.6–15.9)
LYMPH%: 22.8 % (ref 14.0–49.7)
MCH: 37.5 pg — AB (ref 25.1–34.0)
MCHC: 35.7 g/dL (ref 31.5–36.0)
MCV: 105.1 fL — AB (ref 79.5–101.0)
MONO#: 0.2 10*3/uL (ref 0.1–0.9)
MONO%: 8.3 % (ref 0.0–14.0)
NEUT#: 1.3 10*3/uL — ABNORMAL LOW (ref 1.5–6.5)
NEUT%: 64.1 % (ref 38.4–76.8)
NRBC: 0 % (ref 0–0)
PLATELETS: 177 10*3/uL (ref 145–400)
RBC: 2.96 10*6/uL — AB (ref 3.70–5.45)
RDW: 13.8 % (ref 11.2–14.5)
WBC: 2.1 10*3/uL — ABNORMAL LOW (ref 3.9–10.3)
lymph#: 0.5 10*3/uL — ABNORMAL LOW (ref 0.9–3.3)

## 2016-05-12 NOTE — Progress Notes (Signed)
[place]  [phone] [phone]     ID: [name] [name] OB: 1960/07/15  MR#: [id number]  UKG#:[id number]  PCP: Pcp Not In System GYN:  [name] SU:  OTHER MD: [name], [name], [name], [name], [name], Kyung Rudd   CHIEF COMPLAINT: Stage IV breast cancer  CURRENT TREATMENT: Fulvestrant, denosumab, palbociclib  INTERVAL HISTORY:   [name] [name] returns today for follow-up of her metastatic breast cancer accompanied by her husband [name]. Since her last visit here [facility] was restaged with MRIs of the abdomen and pelvis. The pelvis MRI shows multiple bone lesions which however appears stable. The MRI of the liver shows no new lesions but the lesions have grown by a few millimeters. There has been minimal change in the left lung tumor.  She is currently on her week "off" of Ibrance. She continues to have multiple symptoms related to this including fatigue and mild nausea. She is also on fulvestrant and then os amount of monthly with generally good tolerance.  REVIEW OF SYiSTEMS: [name] [name] was very distressed today because the MRI of the liver in particular shows evidence of progression. She felt very frustrated and angry. She continues to have significant pain in the pelvis area and groin and feels that she had had earlier radiation to the acetabular lesion she will be having all this pain. She is afraid she is conjoined up in a wheelchair. She is also afraid of the fact that the cancer continues to grow, even though slowly, and "I don't know what to do". Aside from this understandable anxiety, anger and frustration, a detailed review of systems today was stable.  BREAST CANCER HISTORY: From doctor Bernell List Khan's intake note 03/20/2004:  "The patient is a very pleasant 56 year old female, without significant past medical history.  Her family history is significant for a sister who at age 73 was diagnosed with  invasive ductal carcinoma.  She is a breast cancer survivor at age 53 now.  The patient states that she has never really had a screening mammogram until October 2005, when she felt that it was time for her to start having mammograms done on a yearly basis.  Therefore, on 01/26/04, she underwent a screening mammogram and an abnormality was detected in the upper outer right breast.  She, therefore, underwent spot compression views of both the right and the left breast.  The left breast revealed a well-defined mass in the upper outer left quadrant, present at the 2 o'clock position, measuring 1.8 cm, 6 cm from the nipple.  This, by ultrasound, was felt to be a simple cyst measuring 1.8 cm.  On the right breast, a spiculated mass was noted in the upper outer right quadrant.  The ultrasound revealed a shadowing irregular solid mass at the 10:30 position, 9 cm from the nipple, measuring 1.2 cm in greatest dimension, correlating with the spiculated mass seen on the mammogram.  The right axilla was negative ultrasonically.  Because of this, the patient underwent a needle biopsy of the right breast and the biopsy was positive invasive mammary carcinoma that showed features consistent with a high-grade invasive ductal carcinoma associated with desmoplastic stroma.  No in situ component was seen and no definite lymphovascular invasion was identified.  On the core biopsy, the tumor measured about 0.8 cm.  Because of this, she was seen by Dr. [name] and the patient was taken to the [facility] on March 15, 2004.  She underwent a right breast  lumpectomy with sentinel node biopsy.  The final pathology revealed an invasive ductal carcinoma, measuring 1.7 cm, grade 2 of 3.  Margins were free of tumor.  Atypical lobular hyperplasia was noted.  One sentinel node was removed which was negative for metastatic disease.  The tumor was staged at T1c, N0 MX.  It was estrogen receptor positive, progesterone receptor positive.   HER-2/neu was 2+.  FISH was negative.  All margins were free of tumor.  She is now seen in Medical Oncology for further evaluation and management of this newly diagnosed T1c, node negative, stage I, invasive ductal carcinoma of the right breast."  Her subsequent history is as detailed below   PAST MEDICAL HISTORY: Past Medical History:  Diagnosis Date  . Bone metastases ([place]) dx'd 05/2014  . Breast cancer ([place]) dx'd 2005/2011  . Peripheral vascular disease ([place]) 02/2010   blood clot related to porta cath  . PONV (postoperative nausea and vomiting)   . S/P radiation therapy 07/17/2014 through 08/02/2014    Left mediastinum, left seventh rib 3250 cGy in 13 sessions   . S/P radiation therapy 12/11/2014 through 12/22/2014    Left parietal calvarium 2400 cGy in 8 sessions   . Seizures ([place]) 2010   Isolated incident.    PAST SURGICAL HISTORY: Past Surgical History:  Procedure Laterality Date  . AXILLARY LYMPH NODE DISSECTION  Dec. 2011  . BREAST LUMPECTOMY  2005  . MEDIASTINOTOMY CHAMBERLAIN MCNEIL Left 06/02/2013   Procedure: MEDIASTINOTOMY CHAMBERLAIN MCNEIL;  Surgeon: [name], MD;  Location: [facility];  Service: Thoracic;  Laterality: Left;  LEFT ANTERIOR MEDIASTINOTOMY   . PORTACATH PLACEMENT  12/11  . removal portacath      FAMILY HISTORY Family History  Problem Relation Age of Onset  . COPD Mother   . Breast cancer Sister 42   The patient's father is living, 51 years old as of may 2015. He lives in [place]. The patient's mother died from complications of COPD at the age of 33. These has 2 brothers, one sister. Her sister developed breast cancer at the age of 12. She is doing well. The patient herself underwent genetic testing at [facility] in 2011 and was found to be BRCA negative  GYNECOLOGIC HISTORY:  Menarche age 45, she  is GX P0. She stopped having periods with her initial chemotherapy in 2006.  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 Her husband [name] is a Medical illustrator in [place]. He has a child from a prior marriage. At home they have 2 rescue dogs, Hobo and [place]. The patient is religious but not a church attender    ADVANCED DIRECTIVES: In place; at the 08/04/2014 visit in particular the patient was very clear, with her husband present, that she would not want any kind of feeding tubes or "other tubes" if her condition deteriorated.   HEALTH MAINTENANCE: Social History  Substance Use Topics  . Smoking status: Never Smoker  . Smokeless tobacco: Never Used  . Alcohol use No     Colonoscopy:  PAP:  Bone density: March 2015; mild osteopenia  Lipid panel:  Allergies  Allergen Reactions  . 2nd Skin Quick Heal Other (See Comments)    Other Reaction: Skin peels  . Decadron [Dexamethasone] Other (See Comments)    Patient does not tolerate steroids.   . Dilaudid [Hydromorphone] Nausea And Vomiting  . Enoxaparin Other (See Comments)    unknown  .  Fluconazole Swelling    Liver toxicity  . Hydromorphone Hcl Nausea And Vomiting  . Morphine And Related Nausea And Vomiting  . Protonix [Pantoprazole Sodium] Other (See Comments)    Patient reports it caused thrush.  . Tegaderm Ag Mesh [Silver]     Current Outpatient Prescriptions  Medication Sig Dispense Refill  . ALPRAZolam (XANAX) 0.5 MG tablet Take 1 tablet (0.5 mg total) by mouth 2 (two) times daily as needed for anxiety. 30 tablet 0  . B Complex-C (B-COMPLEX WITH VITAMIN C) tablet Take 1 tablet by mouth daily. Reported on 03/27/2015    . calcium carbonate (TUMS - DOSED IN MG ELEMENTAL CALCIUM) 500 MG chewable tablet Chew 1 tablet by mouth as directed.    . cholecalciferol 2000 UNITS tablet Take 1 tablet (2,000 Units total) by mouth daily.    . ciprofloxacin  (CIPRO) 500 MG tablet Take 1 tablet (500 mg total) by mouth 2 (two) times daily. 6 tablet 0  . Diphenhyd-Hydrocort-Nystatin (FIRST-DUKES MOUTHWASH) SUSP 5-10 ml qid SWISH AND SPIT 062 mL 3  . folic acid (FOLVITE) 1 MG tablet Take 1 tablet (1 mg total) by mouth daily.    Marland Kitchen loratadine-pseudoephedrine (CLARITIN-D 24-HOUR) 10-240 MG 24 hr tablet Take 1 tablet by mouth daily.    . Melatonin 3 MG TABS Take 3 mg by mouth at bedtime.    . methylPREDNISolone (MEDROL) 4 MG tablet Take 1 tablet (52m) x3 per day for 1 day, then 1 tablet x2 per day for 1 day, then 1 tablet per day for 1 day, then discontinue. 6 tablet 0  . naproxen sodium (ANAPROX) 220 MG tablet Take 220 mg by mouth 2 (two) times daily with a meal.    . palbociclib (IBRANCE) 100 MG capsule Take 1 capsule (100 mg total) by mouth daily with breakfast. Take whole with food. 21 capsule 6  . phenazopyridine (PYRIDIUM) 200 MG tablet Take 1 tablet (200 mg total) by mouth 3 (three) times daily as needed for pain. 30 tablet 1  . RABEprazole Sodium 5 MG CPSP Take 5 mg by mouth daily. 30 capsule 3  . saccharomyces boulardii (FLORASTOR) 250 MG capsule Take 250 mg by mouth daily.      No current facility-administered medications for this visit.     OBJECTIVE: Middle-aged white woman Who was tearful during today's visit  There were no vitals filed for this visit.   There is no height or weight on file to calculate BMI.   Patient refused vitals 05/12/16    ECOG FS: 2  Sclerae unicteric, EOMs intact Oropharynx clear and moist No cervical or supraclavicular adenopathy Lungs no rales or rhonchi Heart regular rate and rhythm Abd soft, nontender, positive bowel sounds MSK no focal spinal tenderness, no upper extremity lymphedema; normal left straight leg raising, right leg 2 nearly 90, with a cramp developing Neuro: nonfocal, well oriented, depressed affect Breasts: Deferred   LAB RESULTS:   CMP     Component Value Date/Time   NA 137  05/05/2016 1127   K 4.2 05/05/2016 1127   CL 103 12/14/2014 0800   CL 105 05/06/2012 1333   CO2 27 05/05/2016 1127   GLUCOSE 89 05/05/2016 1127   GLUCOSE 124 (H) 05/06/2012 1333   BUN 8.7 05/05/2016 1127   CREATININE 0.9 05/05/2016 1127   CALCIUM 9.9 05/05/2016 1127   PROT 7.5 05/05/2016 1127   ALBUMIN 4.2 05/05/2016 1127   AST 32 05/05/2016 1127   ALT 38 05/05/2016 1127   ALKPHOS 80  05/05/2016 1127   BILITOT 0.38 05/05/2016 1127   GFRNONAA >60 12/14/2014 0800   GFRAA >60 12/14/2014 0800    No results found for: SPEP  Lab Results  Component Value Date   WBC 2.1 (L) 05/12/2016   NEUTROABS 1.3 (L) 05/12/2016   HGB 11.1 (L) 05/12/2016   HCT 31.1 (L) 05/12/2016   MCV 105.1 (H) 05/12/2016   PLT 177 05/12/2016      Chemistry      Component Value Date/Time   NA 137 05/05/2016 1127   K 4.2 05/05/2016 1127   CL 103 12/14/2014 0800   CL 105 05/06/2012 1333   CO2 27 05/05/2016 1127   BUN 8.7 05/05/2016 1127   CREATININE 0.9 05/05/2016 1127      Component Value Date/Time   CALCIUM 9.9 05/05/2016 1127   ALKPHOS 80 05/05/2016 1127   AST 32 05/05/2016 1127   ALT 38 05/05/2016 1127   BILITOT 0.38 05/05/2016 1127     No results for input(s): INR in the last 168 hours.  Urinalysis    Component Value Date/Time   COLORURINE YELLOW 11/17/2014 0143   APPEARANCEUR CLOUDY (A) 11/17/2014 0143   LABSPEC 1.005 01/02/2016 1127   PHURINE 7.0 01/02/2016 1127   PHURINE 6.5 11/17/2014 0143   GLUCOSEU Negative 01/02/2016 1127   HGBUR Trace 01/02/2016 1127   HGBUR NEGATIVE 11/17/2014 0143   BILIRUBINUR Negative 01/02/2016 1127   KETONESUR Negative 01/02/2016 1127   KETONESUR NEGATIVE 11/17/2014 0143   PROTEINUR Negative 01/02/2016 1127   PROTEINUR NEGATIVE 11/17/2014 0143   UROBILINOGEN 0.2 01/02/2016 1127   NITRITE Negative 01/02/2016 1127   NITRITE NEGATIVE 11/17/2014 0143   LEUKOCYTESUR Trace 01/02/2016 1127    Ref Range & Units 9d ago 66moago 235mogo    CA 27.29 0.0 -  38.6 U/mL 2,610.0   2,652.0CM   2,463.9CM    Comments: Specimen      STUDIES: Mr Pelvis W Wo Contrast  Result Date: 05/08/2016 CLINICAL DATA:  Patient with a history of metastatic breast cancer. Pain about the pubic ramus. EXAM: MRI PELVIS WITHOUT AND WITH CONTRAST TECHNIQUE: Multiplanar multisequence MR imaging of the pelvis was performed both before and after administration of intravenous contrast. CONTRAST:  15 ml MULTIHANCE GADOBENATE DIMEGLUMINE 529 MG/ML IV SOLN COMPARISON:  PET CT scan 02/22/2016. FINDINGS: Abnormal signal and enhancement are seen throughout the right inferior pubic ramus consistent with metastatic disease. A small focus of abnormal signal and enhancement in the posterior right acetabulum and right superior pubic ramus are also consistent with metastatic disease. A larger deposit in the anterior right acetabulum measures 2.0 cm transverse by 1.7 cm AP on image 17 of series 21. Additional smaller metastatic deposits are seen scattered throughout the pelvis including the sacrum. Metastatic deposits are also identified in the L3 and L4 vertebral bodies, larger in L3 with the lesion measures 3.1 cm transverse by 1.6 cm craniocaudal. Minimal inferior endplate compression fracture anteriorly in L3 is identified. There is no fracture. No avascular necrosis of the femoral heads. Hips, sacroiliac joints and symphysis pubis appear normal. Musculature is intact. No evidence of bursitis. IMPRESSION: Multifocal osseous metastases throughout the pelvis, largest in the right inferior pubic ramus and anterior right acetabulum. Metastatic deposits in the L3 and L4 vertebral bodies are also identified. Very mild inferior endplate compression fracture of L3 is seen in the coronal plane only. Electronically Signed   By: ThInge Rise.D.   On: 05/08/2016 14:47   Mr Abdomen W Wo  Contrast  Result Date: 05/08/2016 CLINICAL DATA:  Metastatic breast cancer with known left pleural, hepatic, and abdominal  nodal metastasis. EXAM: MRI ABDOMEN WITHOUT AND WITH CONTRAST TECHNIQUE: Multiplanar multisequence MR imaging of the abdomen was performed both before and after the administration of intravenous contrast. CONTRAST:  53m MULTIHANCE GADOBENATE DIMEGLUMINE 529 MG/ML IV SOLN COMPARISON:  PET of 02/22/2016 and abdominal MRI of 02/14/2016. FINDINGS: Lower chest: Normal heart size. Re- demonstrated is left-sided pleural metastasis. Rind of T2 hyperintense left pleural thickening medially measures 1.5 cm on image 18/series 11 versus 1.3 cm at the same level on the prior. More cephalad medial posterior enhancing left pleural nodule measures 1.5 cm on image 17/ series 17002 and is unchanged. More anterior left pleural based nodule measures 1.4 cm on image 18/ series 17002 and is enlarged since the prior. Hepatobiliary: Hepatic metastasis are best evaluated on series 17002. Index central right hepatic lobe lesion measures 2.7 x 2.6 cm on image 40 versus 2.7 x 2.2 cm on the prior. A more peripheral right sided mass measures 4.9 x 3.9 cm on image 44 today versus 4.5 x 3.8 cm on the prior. A pericholecystic right liver lobe lesion measures 2.9 x 2.1 cm on image 52 today versus 1.8 x 1.6 cm on the prior. Far inferior right hepatic lobe lesion measures 1.6 cm on image 70 today versus 1.2 cm on the prior. Cholelithiasis without acute cholecystitis or biliary duct dilatation. Pancreas:  Normal, without mass or ductal dilatation. Spleen:  Normal in size, without focal abnormality. Adrenals/Urinary Tract: Normal adrenal glands. Normal kidneys, without hydronephrosis. Stomach/Bowel: Normal stomach and abdominal bowel loops. Vascular/Lymphatic: Aortic atherosclerosis. A circumaortic left renal vein. Portacaval node measures 8 mm on image 26/series 11 compared to 7 mm on the prior. Not pathologic by size criteria. Left abdominal retroperitoneal node measures 11 x 9 mm on image 51/series 1202 and is not significantly changed. Other:  No  ascites.  No evidence of omental or peritoneal disease. Musculoskeletal: Osseous metastasis again identified. A right-sided T11 lesion measures 1.5 cm on image 23/series 11 versus 1.9 cm on the prior. Right-sided lamina lesion at L1 measures 11 mm on image 36/ series 11 and is unchanged. Inferior L3 lesion measures 3.1 x 1.9 cm on image 28/series 18. Compare 3.3 x 1.7 cm on the prior, similar. IMPRESSION: 1. Since the prior MRI of 02/14/2016, progression of hepatic metastasis. 2. Similar to mild progression of left pleural metastasis, incompletely imaged. 3. Similar borderline to mild abdominal adenopathy. 4. Similar to mild improvement in osseous metastasis. 5. Cholelithiasis. Electronically Signed   By: KAbigail MiyamotoM.D.   On: 05/08/2016 13:29    ASSESSMENT: 56y.o. BRCA negative [place] woman with stage IV breast cancer, history as follows  (1)  S/p Right upper inner quadrant lumpectomy and sentinel lymph node sampling 03/15/2004 for a pT1c pN0. Stage IA invasive ductal carcinoma, grade 2, estrogen receptor 95% positive, progesterone receptor 65% positive, HER-2 not amplified; additional surgery 04/25/2004 for seroma or clearance showed no residual tumor  (2) adjuvant chemotherapy with cyclophosphamide and doxorubicin every 21 days x4 completed 07/19/2004  (3) adjuvant radiation given under Dr. CDonella Stadein BM[place]completed July 2006  (4) the patient opted against adjuvant antiestrogen therapy  (5) genetics testing showed no BRCA mutations  (6) biopsy of a palpable right axillary mass 10/24/2009 showed invasive ductal carcinoma, grade 3, estrogen receptor 100% positive, progesterone receptor 2% positive (alert score 5) HER-2 negative; no evidence of systemic disease on PET scanning  (  7) completed 3 of 4 planned cycles of docetaxel and cyclophosphamide September 2011, fourth cycle omitted because of marked elevations in liver function tests  (8) an right axillary lymph node dissection  03/06/2010 showed 3/8 lymph nodes removed to be involved by tumor, with extracapsular extension.  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, TTS-1 negative), estrogen receptor 80% positive, progesterone receptor 2% positive, HER-2 not amplified  (12) letrozole started March 2015-- tolerated with significant side effects, discontinued at the end of May 2015  (13) PET scan 08/16/2013 shows extensive left pleural metastatic disease and a large left pleural effusion that shifts cardiac and mediastinal structures to the right; adenopathy (celiac trunk, periadrenal, periaortic); and a left medial clavicular lesion; Status post left thoracentesis 08/16/2013 positive for adenocarcinoma, estrogen receptor positive, progesterone receptor negative.  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 mutation; everolimus was suggested as an available targeted agent  (18) exemestane started 03/31/2014, discontinued 10/31/2014 with evidence of progression  (a) everolimus added 04/03/2014 but not tolerated (cytopenias, elevated LFTs) even at minimal doses; stopped  04/17/2014  (19) fulvestrant started 12/20/2014  (a) palbociclib added at very low dose 04/03/2015 (starting dose 75 mg weekly)  (b) palbociclib dose gradually increased to 75 mg daily, 21/7, as of May 2017  (c)  palbociclib dose increased to 100 mg daily, 21/ 7, beginning November mid- cycle  (20) liver biopsy 03/20/2015 confirms metastatic carcinoma, still estrogen receptor positive at 100%, progesterone receptor negative, HER-2 equivocal with a signals ratio 1.41, number per cell 4.50.   (a) repeat liver biopsy December 2017 might show further changes (HER-2 positivity)  (21) immunohistochemistry for mismatched repair protein mutations 03/20/2015 showed normal major and minor MMR proteins, with a very low probability of microsatellite instability ([id number])  (22) adjuvant radiation 12/24/15-01/02/16 Site/dose:   1) Left T9 Rib / 24 Gy in 8 fx                         2) Right inferior pelvis/ 24 Gy in 8 fx  (23) consider pembrolizumab (obtained compassionate release from company)    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 treated with Lovenox for 5-6 months  (c) right upper extremity lymphedema--receiving physical therapy  (d) hepatic steatosis with chronically elevated LFTs as well as unusual hepatic sensitivity to chemotherapy  (e) osteopenia with the lowest T score -1.6 on bone density scan 06/20/2013  (i) on denosumab/ Xgeva Q28d  (f) radiation oncology (Dr Valere Dross) has reviewed prior radiation records in case there is further mediastinal involvement with dysphagia etc in which case palliative XRT could be considered  (a) radiation to left mediastinum/ left 7th rib 3250  cGy in 13 sessions04/18/2016 through 08/02/2014  (b) radiation to T11 area: 22 Gy in 7 sessions, last dose 11/27/2014  (c) radiation left parietal scalp region to be completed  12/22/2014  (d) radiation to sacral area completed 04/09/2015  (e) radiation to right inferior pelvis and left ninth rib (24 gray, 12/24/2015--01/02/2016)  (f) T11 was treated stereotactically with 14 gray in 1 fraction 03/12/2016.  (g) chest wall and perineal pain--improved post radiation treatments  (a) discussed celebrex/ carafate but holding off for now  (h) zoster diagnosed 04/04/2015-- on valacyclovir   PLAN: I spent approximately one hour today with [name] June going over her situation. While she is very focused on the bone lesions, and upset that radiology may not have been doing serial readings every time as they should, what concerns me most is the fact that her liver lesions continued to grow. This is our best site of measurable disease and there is no lesion that became stable. They'll grew at least slightly.  While it continues to be favorable that her cancer grow slowly, this also means that it is unlikely to benefit from chemotherapy and in fact it has not benefited from the chemotherapy she has received as far as it is possible for [place] to tell. The question is whether to continue what we are doing in hopes that perhaps after another 3 months at the correct dose of Ibrance she will gets at least disease stability, or change. Options for change include going to a different CK inhibitor, adding letrozole to the current treatment, adding pembrolizumab to the current treatment, stopping the current treatment and going to pembrolizumab, or giving everolimus or temsirolimus a second try at a lower dose and greater dosing interval.  Today we also consider local treatment. She is not a candidate for radiofrequency ablation but is a candidate for yttrium-90 and we have made an appointment for her with Dr. Pascal Lux in approximately 10 days to further discuss this option. The thinking here would be that if we could control the liver lesions more effectively, this would gain [place] time to deal with the  tumor in the left lung and bones, which are either less dangerous or better controlled.  Louise remained understandably distraught at the end of our discussion. She is interested in finding out if her cancer is P-D1 pr PD-L1 positive and I will ask pathology to help [place] in that regard. We are also scheduling her for a PET scan to give [place] more information regarding diverticular her bone lesions but also the long involvement, and to serve as a new baseline. She will see me again later this week to discuss those results and to continue discussion today.    Chauncey Cruel, MD   05/12/2016 5:51 PM

## 2016-05-12 NOTE — Telephone Encounter (Signed)
lvm to inform pt of added ov appt to 2/12 visit per LOS at 330 pm

## 2016-05-12 NOTE — Therapy (Signed)
[place], [place], [address] Phone: [phone]   Fax:  [phone]  Physical Therapy Treatment  Patient Details  Name: [name] [name] MRN: [id number] Date of Birth: 04-04-1960 Referring Provider: Dr. Kyung Rudd  Encounter Date: 05/12/2016      PT End of Session - 05/12/16 1947    Visit Number [id number] for lymphd   Number of Visits 25  lymph   Date for PT Re-Evaluation 06/25/16  lymph   Authorization - Number of Visits 140  for 2018   PT Start Time 1019   PT Stop Time 1101   PT Time Calculation (min) 42 min   Activity Tolerance Patient tolerated treatment well   Behavior During Therapy [facility] for tasks assessed/performed      Past Medical History:  Diagnosis Date  . Bone metastases ([place]) dx'd 05/2014  . Breast cancer ([place]) dx'd 2005/2011  . Peripheral vascular disease ([place]) 02/2010   blood clot related to porta cath  . PONV (postoperative nausea and vomiting)   . S/P radiation therapy 07/17/2014 through 08/02/2014    Left mediastinum, left seventh rib 3250 cGy in 13 sessions   . S/P radiation therapy 12/11/2014 through 12/22/2014    Left parietal calvarium 2400 cGy in 8 sessions   . Seizures ([place]) 2010   Isolated incident.    Past Surgical History:  Procedure Laterality Date  . AXILLARY LYMPH NODE DISSECTION  Dec. 2011  . BREAST LUMPECTOMY  2005  . MEDIASTINOTOMY CHAMBERLAIN MCNEIL Left 06/02/2013   Procedure: MEDIASTINOTOMY CHAMBERLAIN MCNEIL;  Surgeon: [name], MD;  Location: [place];  Service: Thoracic;  Laterality: Left;  LEFT ANTERIOR MEDIASTINOTOMY   . PORTACATH PLACEMENT  12/11  . removal portacath      There were no vitals filed for this visit.      Subjective Assessment - 05/12/16 1018    Subjective Magrinat  called me yesterday afternoon to tell me the liver mets have grown; millimeters, but they've grown.  She will see him later today at 3:30.   Currently in Pain? Yes   Pain Score 3    Pain Location Leg   Pain Orientation Right;Upper   Pain Score 6   Pain Location Axilla   Pain Orientation Right                         [facility] Adult PT Treatment/Exercise - 05/12/16 0001      Manual Therapy   Myofascial Release right axilla with focus on scar tightness   Manual Lymphatic Drainage (MLD) In left sidelying, posterior interaxillary anastomosis and right axillo-inguinal anastomosis; in supine, short neck, left axilla and anterior interaxillary anastomosis, right groin and axillo-inguinal anastomosis, and right upper extremity from fingers to shoulder.  In right sidelying, left periscapular area toward left groin; extra time spent on the latter today.   Other Manual Therapy soft tissue work at superior aspect of right medial and upper thigh in prone position for pain relief and muscle relaxation, gentle myofascial release at upper thigh, with releases today from far medial to lateral aspects of posterior thigh; extra time on pulling at upper thigh today                  PT Short Term Goals - 02/12/16 1226      PT SHORT TERM GOAL #1   Title pain with walking decreased >/= 25%   Time 4   Period  Weeks   Status Achieved           PT Long Term Goals - 05/06/16 1239      PT LONG TERM GOAL #2   Title pain with walking decreased >/= 75%   Time 8   Period Weeks   Status On-going  50% after therapy but does not stay     PT LONG TERM GOAL #3   Title ability to flex her right hip with right groin pain decreased >/= 50% due to improved tissue mobility   Time 8   Period Weeks   Status Achieved     PT LONG TERM GOAL #4   Title waking up in middle of night with pain decreased >/=50%   Time 8   Period Weeks   Status Achieved     PT LONG TERM GOAL #5   Title  reduction of pain by end of day >/= 50% due to increase tissue mobility   Time 8   Period Weeks   Status On-going  decreased by 50% after therapy but does not stay           Long Term Clinic Goals - 05/02/16 1112      CC Long Term Goal  #2   Title Pt. will report swelling is adequately managed to enable ADL function at a consistent level.   Status On-going     CC Long Term Goal  #4   Title Pain/discomfort at right axilla area will be controlled at 6/10 or less.   Status On-going     CC Long Term Goal  #5   Title Patient will avoid infection by ongoing management of her lymphedema at right breast/axilla/upper arm areas.   Status On-going     CC Long Term Goal  #6   Title Pain in right groin/ischial tuberosity area will be controlled at 3/10 level or less to enable walking with less pain.   Status On-going            Plan - 05/12/16 1948    Clinical Impression Statement Pt. felt a release today with myofascial release at right upper medial thigh and with that had immediate decrease of pain. She is currently dealing with just having learned her liver metastases have increased in size.   Rehab Potential Good   Clinical Impairments Affecting Rehab Potential active cancer   PT Frequency 2x / week   PT Duration 12 weeks   PT Treatment/Interventions ADLs/Self Care Home Management;Therapeutic activities;Therapeutic exercise;Patient/family education;Manual techniques;Manual lymph drainage;Scar mobilization   PT Next Visit Plan For cancer rehab, manual lymph drainage, soft tissue work, and myofascial pulling   Consulted and Agree with Plan of Care Patient      Patient will benefit from skilled therapeutic intervention in order to improve the following deficits and impairments:  Increased edema, Increased fascial restricitons, Pain  Visit Diagnosis: Pain in right thigh  Lymphedema, not elsewhere classified     Problem List Patient Active Problem List   Diagnosis Date  Noted  . Liver metastases ([place]) 02/23/2016  . 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 Zoster 04/04/2015  . Nausea with vomiting 11/18/2014  . Constipation 11/18/2014  . Left-sided thoracic back pain   . Bone metastases ([place]) 11/16/2014  . Back pain 11/15/2014  . Uncontrolled pain 11/14/2014  . Post-lymphadenectomy lymphedema of arm 05/31/2014  . Chest wall pain 03/21/2014  . Abnormal LFTs (liver function tests) 09/12/2013  . Malignant neoplasm of upper-inner quadrant of right breast in female,  estrogen receptor positive ([place]) 08/18/2013  . 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

## 2016-05-13 ENCOUNTER — Other Ambulatory Visit: Payer: Self-pay | Admitting: Oncology

## 2016-05-13 ENCOUNTER — Encounter: Payer: Self-pay | Admitting: Physical Therapy

## 2016-05-13 ENCOUNTER — Other Ambulatory Visit (HOSPITAL_COMMUNITY)
Admission: RE | Admit: 2016-05-13 | Discharge: 2016-05-13 | Disposition: A | Discharge status: Home / Self Care | Payer: 59 | Source: Ambulatory Visit | Attending: Oncology | Admitting: Oncology

## 2016-05-13 ENCOUNTER — Ambulatory Visit: Payer: 59 | Admitting: Physical Therapy

## 2016-05-13 DIAGNOSIS — R978 Other abnormal tumor markers: Secondary | ICD-10-CM

## 2016-05-13 DIAGNOSIS — M79651 Pain in right thigh: Secondary | ICD-10-CM

## 2016-05-13 DIAGNOSIS — M25551 Pain in right hip: Secondary | ICD-10-CM

## 2016-05-13 DIAGNOSIS — C50211 Malignant neoplasm of upper-inner quadrant of right female breast: Secondary | ICD-10-CM

## 2016-05-13 DIAGNOSIS — M62838 Other muscle spasm: Secondary | ICD-10-CM

## 2016-05-13 DIAGNOSIS — C7951 Secondary malignant neoplasm of bone: Secondary | ICD-10-CM

## 2016-05-13 DIAGNOSIS — Z01818 Encounter for other preprocedural examination: Secondary | ICD-10-CM | POA: Diagnosis not present

## 2016-05-13 DIAGNOSIS — Z170 Estrogen receptor positive status [ER+]: Principal | ICD-10-CM

## 2016-05-13 DIAGNOSIS — C787 Secondary malignant neoplasm of liver and intrahepatic bile duct: Secondary | ICD-10-CM

## 2016-05-13 DIAGNOSIS — R935 Abnormal findings on diagnostic imaging of other abdominal regions, including retroperitoneum: Secondary | ICD-10-CM

## 2016-05-13 NOTE — Therapy (Signed)
[facility] [address] [place], [place], [address] Phone: [phone]   Fax:  [phone]  Physical Therapy Treatment  Patient Details  Name: [name] [name] MRN: [id number] Date of Birth: May 10, 1960 Referring Provider: Dr. Kyung Rudd  Encounter Date: 05/13/2016      PT End of Session - 05/13/16 1318    Visit Number 18  pelvic   Date for PT Re-Evaluation 06/25/16  pelvic   Authorization - Visit Number --   Authorization - Number of Visits --   PT Start Time 1230   PT Stop Time 1310   PT Time Calculation (min) 40 min   Activity Tolerance Patient tolerated treatment well   Behavior During Therapy [facility] for tasks assessed/performed      Past Medical History:  Diagnosis Date  . Bone metastases ([place]) dx'd 05/2014  . Breast cancer ([place]) dx'd 2005/2011  . Peripheral vascular disease ([place]) 02/2010   blood clot related to porta cath  . PONV (postoperative nausea and vomiting)   . S/P radiation therapy 07/17/2014 through 08/02/2014    Left mediastinum, left seventh rib 3250 cGy in 13 sessions   . S/P radiation therapy 12/11/2014 through 12/22/2014    Left parietal calvarium 2400 cGy in 8 sessions   . Seizures ([place]) 2010   Isolated incident.    Past Surgical History:  Procedure Laterality Date  . AXILLARY LYMPH NODE DISSECTION  Dec. 2011  . BREAST LUMPECTOMY  2005  . MEDIASTINOTOMY CHAMBERLAIN MCNEIL Left 06/02/2013   Procedure: MEDIASTINOTOMY CHAMBERLAIN MCNEIL;  Surgeon: [name], MD;  Location: [place];  Service: Thoracic;  Laterality: Left;  LEFT ANTERIOR MEDIASTINOTOMY   . PORTACATH PLACEMENT  12/11  . removal portacath      There were no vitals filed for this visit.      Subjective Assessment - 05/13/16 1233    Subjective I need internal  work on right groin.  When I have internal work pain will go down. I have a PET scan tomorrow, Dr. Lisbeth Renshaw Thursday and Dr. Jana Hakim Friday. After treatment I am able to function with greater ease.    Pertinent History patient vomited just prior to treatment today   Limitations Writing   How long can you walk comfortably? more she walks patient will have increased pain that stops her from walking further   Diagnostic tests x rays of back show no fractures;    Patient Stated Goals walk 20 minutes with minimal limp   Currently in Pain? Yes   Pain Score 4    Pain Location Groin   Pain Orientation Right   Pain Descriptors / Indicators Pressure   Pain Type Chronic pain   Pain Onset More than a month ago   Pain Frequency Constant   Aggravating Factors  walking   Pain Relieving Factors soft tissue work   Effect of Pain on Daily Activities walking                      Pelvic Floor Special Questions - 05/13/16 0001    Pelvic Floor Internal Exam Patient approves physical therapist to perform pelvic floor muscle assessment   Exam Type Vaginal           [facility] Adult PT Treatment/Exercise - 05/13/16 0001      Manual Therapy   Manual Therapy Internal Pelvic Floor;Soft tissue mobilization   Soft tissue mobilization right hip adductor and quads, hamstring, and anterior right groin   Internal  Pelvic Floor gentle soft tissue work to rightside of puborectalis, pubococcygeus, ischiococcygeus                  PT Short Term Goals - 02/12/16 1226      PT SHORT TERM GOAL #1   Title pain with walking decreased >/= 25%   Time 4   Period Weeks   Status Achieved           PT Long Term Goals - 05/13/16 1319      PT LONG TERM GOAL #1   Title indpendent with HEP   Time 8   Period Weeks   Status On-going     PT LONG TERM GOAL #2   Title pain with walking decreased >/= 75%   Time 8   Period Weeks   Status On-going  60% after treatment     PT LONG TERM GOAL #5    Title reduction of pain by end of day >/= 50% due to increase tissue mobility   Time 8   Period Weeks   Status On-going  60% decreased after treatment but will go back           [facility] Goals - 05/02/16 1112      CC Long Term Goal  #2   Title Pt. will report swelling is adequately managed to enable ADL function at a consistent level.   Status On-going     CC Long Term Goal  #4   Title Pain/discomfort at right axilla area will be controlled at 6/10 or less.   Status On-going     CC Long Term Goal  #5   Title Patient will avoid infection by ongoing management of her lymphedema at right breast/axilla/upper arm areas.   Status On-going     CC Long Term Goal  #6   Title Pain in right groin/ischial tuberosity area will be controlled at 3/10 level or less to enable walking with less pain.   Status On-going            Plan - 05/13/16 1310    Clinical Impression Statement Patient reports after treatment her pain is 60% better.  Patient has pain in right hip adductors after heel hits the ground. No change in urine stream due to therapist not addressing the soft tissue around the urethra. Patient can ambulate with decreased limp on the right. Tightness felt on the right pelvic floor muscles, right hip adductors and right medial hamstring. Therapy is helping patient by reducing her pain so she is able to continue walking which is proved to help in the treatment of cancer.    Rehab Potential Good   Clinical Impairments Affecting Rehab Potential active cancer   PT Frequency 2x / week   PT Duration 12 weeks   PT Treatment/Interventions ADLs/Self Care Home Management;Therapeutic activities;Therapeutic exercise;Patient/family education;Manual techniques;Manual lymph drainage;Scar mobilization   PT Next Visit Plan soft tissue work; myofascial pulling, release around the urethra   PT Home Exercise Plan progress as needed   Consulted and Agree with Plan of Care Patient       Patient will benefit from skilled therapeutic intervention in order to improve the following deficits and impairments:  Increased edema, Increased fascial restricitons, Pain  Visit Diagnosis: Pain in right thigh  Other muscle spasm  Pain in right hip     Problem List Patient Active Problem List   Diagnosis Date Noted  . Liver metastases ([place]) 02/23/2016  . 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  Zoster 04/04/2015  . Nausea with vomiting 11/18/2014  . Constipation 11/18/2014  . Left-sided thoracic back pain   . Bone metastases ([place]) 11/16/2014  . Back pain 11/15/2014  . Uncontrolled pain 11/14/2014  . Post-lymphadenectomy lymphedema of arm 05/31/2014  . Chest wall pain 03/21/2014  . Abnormal LFTs (liver function tests) 09/12/2013  . Malignant neoplasm of upper-inner quadrant of right breast in female, estrogen receptor positive ([place]) 08/18/2013  . 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 [place], [place], [address] Phone: [phone]   Fax:  [phone]  Name: GABBI PENDELL MRN: [id number] Date of Birth: [birth year]

## 2016-05-14 ENCOUNTER — Ambulatory Visit: Payer: 59

## 2016-05-14 ENCOUNTER — Other Ambulatory Visit: Payer: Self-pay | Admitting: Oncology

## 2016-05-14 ENCOUNTER — Encounter (HOSPITAL_COMMUNITY)
Admission: RE | Admit: 2016-05-14 | Discharge: 2016-05-14 | Disposition: A | Discharge status: Home / Self Care | Payer: 59 | Source: Ambulatory Visit | Attending: Oncology | Admitting: Oncology

## 2016-05-14 DIAGNOSIS — C50211 Malignant neoplasm of upper-inner quadrant of right female breast: Secondary | ICD-10-CM | POA: Insufficient documentation

## 2016-05-14 DIAGNOSIS — C787 Secondary malignant neoplasm of liver and intrahepatic bile duct: Secondary | ICD-10-CM | POA: Insufficient documentation

## 2016-05-14 DIAGNOSIS — R978 Other abnormal tumor markers: Secondary | ICD-10-CM | POA: Diagnosis present

## 2016-05-14 DIAGNOSIS — C7951 Secondary malignant neoplasm of bone: Secondary | ICD-10-CM | POA: Insufficient documentation

## 2016-05-14 DIAGNOSIS — Z170 Estrogen receptor positive status [ER+]: Secondary | ICD-10-CM | POA: Diagnosis present

## 2016-05-14 DIAGNOSIS — R935 Abnormal findings on diagnostic imaging of other abdominal regions, including retroperitoneum: Secondary | ICD-10-CM | POA: Insufficient documentation

## 2016-05-14 LAB — GLUCOSE, CAPILLARY: Glucose-Capillary: 98 mg/dL (ref 65–99)

## 2016-05-14 MED ORDER — FLUDEOXYGLUCOSE F - 18 (FDG) INJECTION
8.0200 | Freq: Once | INTRAVENOUS | Status: AC | PRN
  Administered 2016-05-14: 8.02 via INTRAVENOUS

## 2016-05-14 NOTE — Progress Notes (Signed)
Patient came to infusion room for IV start for scheduled PET scan today. IV started without difficulty. Patient discharged without incident.

## 2016-05-15 ENCOUNTER — Ambulatory Visit
Admission: RE | Admit: 2016-05-15 | Discharge: 2016-05-15 | Disposition: A | Discharge status: Home / Self Care | Payer: 59 | Source: Ambulatory Visit | Attending: Radiation Oncology | Admitting: Radiation Oncology

## 2016-05-15 ENCOUNTER — Ambulatory Visit: Payer: 59 | Admitting: Physical Therapy

## 2016-05-15 ENCOUNTER — Encounter: Payer: Self-pay | Admitting: Radiation Oncology

## 2016-05-15 DIAGNOSIS — Z79899 Other long term (current) drug therapy: Secondary | ICD-10-CM | POA: Diagnosis not present

## 2016-05-15 DIAGNOSIS — Z888 Allergy status to other drugs, medicaments and biological substances status: Secondary | ICD-10-CM | POA: Insufficient documentation

## 2016-05-15 DIAGNOSIS — Z923 Personal history of irradiation: Secondary | ICD-10-CM | POA: Diagnosis not present

## 2016-05-15 DIAGNOSIS — C7951 Secondary malignant neoplasm of bone: Secondary | ICD-10-CM | POA: Diagnosis not present

## 2016-05-15 DIAGNOSIS — M79651 Pain in right thigh: Secondary | ICD-10-CM

## 2016-05-15 DIAGNOSIS — C50911 Malignant neoplasm of unspecified site of right female breast: Secondary | ICD-10-CM | POA: Insufficient documentation

## 2016-05-15 DIAGNOSIS — Z170 Estrogen receptor positive status [ER+]: Secondary | ICD-10-CM | POA: Insufficient documentation

## 2016-05-15 DIAGNOSIS — I890 Lymphedema, not elsewhere classified: Secondary | ICD-10-CM

## 2016-05-15 DIAGNOSIS — Z885 Allergy status to narcotic agent status: Secondary | ICD-10-CM | POA: Insufficient documentation

## 2016-05-15 DIAGNOSIS — C787 Secondary malignant neoplasm of liver and intrahepatic bile duct: Secondary | ICD-10-CM | POA: Insufficient documentation

## 2016-05-15 DIAGNOSIS — C782 Secondary malignant neoplasm of pleura: Secondary | ICD-10-CM | POA: Insufficient documentation

## 2016-05-15 NOTE — Progress Notes (Signed)
Radiation Oncology         [phone] ________________________________  Name: [name] [name] MRN: [id number]  Date: 05/15/2016  DOB: 01-Jun-1960  Follow-Up Visit Note  CC: Pcp Not In System  Magrinat, Virgie Dad, MD  Diagnosis: Stage IV breast cancer with bone metastases  Interval Since Last Radiation: 2 months  03/12/2016: The Spine T11 was treated to 14 Gy in 1 fraction.  12/24/15-01/02/16: 1) Left T9 Rib / 24 Gy in 8 fractions 2) Right inferior pelvis/ 21 Gy in 7 fractions  03/29/2015 - 04/09/2015: Right inferior pubic ramus metastases were treated to 24 Gy in 4 fractions of 6 Gy.  12/11/2014 - 12/22/2014: Left parietal calvarium 2400 cGy in 8 fractions.  11/17/2014 - 11/27/2014: Lower thoracic spine (T11). 22 Gy in 7 fractions.  07/17/2014 - 08/02/2014: Left mediastinum, left seventh rib. 32.5 Gy in 13 fractions.  Completed March 2012: 45 Gy radiation to the right axillary and right supraclavicular nodal areas, with capecitabine sensitization, by Dr. Sandi Mealy at [facility]  Completed July 2006: Adjuvant radiation given under Dr. Donella Stade in [place] for Stage IA (pT1c, pN0) invasive ductal carcinoma, grade 2, (ER/PR +, HER2 -) of the right breast  Narrative:  The patient returns today for routine follow-up. MRI of the abdomen on 05/08/16 showed progression of hepatic metastasis, similar to mild progression of left pleural metastasis, similar borderline to mild abdominal adenopathy, and similar to mild improvement in osseous metastasis. The patient followed up with Dr. Jana Hakim on 05/12/16 who discussed her MRI. The patient's current treatment is fulvestrant, denosumab, and palbociclib. What concerns Dr. Jana Hakim is that the patient's liver lesions continue to grow, but slowly. He is deciding to either keep systemic treatment the same or make changes. They also discussed local treatment to the liver. The patient is not a candidate for radiofrequency ablation, but is  a candidate for yttrium-90 and Dr. Jana Hakim has made an appointment for her with Dr. Pascal Lux in a few days to further discuss that treatment option. Dr. Jana Hakim will also involve pathology to determine if the patient's cancer is P-D1 or PD-L1 positive. He also scheduled a PET scan for restaging purposes.  PET scan on 05/08/16 showed an overall mixed response to treatment with the majority of disease in the chest similar to minimally improved, there was a new medial left subpectoral hypermetabolic node with resolution of hypermetabolism in a more lateral left subpectoral node, similar disease in the abdominal nodal stations and the liver, further improvement in hypermetabolism involving the T11 vertebral body, a new focus of hypermetabolism in C4 suspicious for a new metastasis, and the right inferior pubic ramus hypermetabolism is similar.  On review of systems, the patient reports pain as a 3/10 in the pelvis and right groin region. She has mild nausea and a poor appetite. She states she drinks enough water. She has constipation and an inconsistent urine stream. She attends PT three times a week.  Past Medical History:  Past Medical History:  Diagnosis Date  . Bone metastases ([place]) dx'd 05/2014  . Breast cancer ([place]) dx'd 2005/2011  . Peripheral vascular disease ([place]) 02/2010   blood clot related to porta cath  . PONV (postoperative nausea and vomiting)   . S/P radiation therapy 07/17/2014 through 08/02/2014    Left mediastinum, left seventh rib 3250 cGy in 13 sessions   . S/P radiation therapy 12/11/2014 through 12/22/2014    Left parietal calvarium 2400 cGy in 8 sessions   . Seizures ([place]) 2010  Isolated incident.    Past Surgical History: Past Surgical History:  Procedure Laterality Date  . AXILLARY LYMPH NODE DISSECTION  Dec. 2011  . BREAST  LUMPECTOMY  2005  . MEDIASTINOTOMY CHAMBERLAIN MCNEIL Left 06/02/2013   Procedure: MEDIASTINOTOMY CHAMBERLAIN MCNEIL;  Surgeon: [name], MD;  Location: [place];  Service: Thoracic;  Laterality: Left;  LEFT ANTERIOR MEDIASTINOTOMY   . PORTACATH PLACEMENT  12/11  . removal portacath      Social History:  Social History   Social History  . Marital status: Married    Spouse name: N/A  . Number of children: N/A  . Years of education: N/A   Occupational History  . Not on file.   Social History Main Topics  . Smoking status: Never Smoker  . Smokeless tobacco: Never Used  . Alcohol use No  . Drug use: No  . Sexual activity: Yes   Other Topics Concern  . Not on file   Social History Narrative  . No narrative on file    Family History: Family History  Problem Relation Age of Onset  . COPD Mother   . Breast cancer Sister 2    ALLERGIES:  is allergic to 2nd skin quick heal; decadron [dexamethasone]; dilaudid [hydromorphone]; enoxaparin; fluconazole; hydromorphone hcl; morphine and related; protonix [pantoprazole sodium]; and tegaderm ag mesh [silver].  Meds: Current Outpatient Prescriptions  Medication Sig Dispense Refill  . ALPRAZolam (XANAX) 0.5 MG tablet Take 1 tablet (0.5 mg total) by mouth 2 (two) times daily as needed for anxiety. 30 tablet 0  . B Complex-C (B-COMPLEX WITH VITAMIN C) tablet Take 1 tablet by mouth daily. Reported on 03/27/2015    . calcium carbonate (TUMS - DOSED IN MG ELEMENTAL CALCIUM) 500 MG chewable tablet Chew 1 tablet by mouth as directed.    . cholecalciferol 2000 UNITS tablet Take 1 tablet (2,000 Units total) by mouth daily.    . ciprofloxacin (CIPRO) 500 MG tablet Take 1 tablet (500 mg total) by mouth 2 (two) times daily. 6 tablet 0  . Diphenhyd-Hydrocort-Nystatin (FIRST-DUKES MOUTHWASH) SUSP 5-10 ml qid SWISH AND SPIT 638 mL 3  . folic acid (FOLVITE) 1 MG tablet Take 1 tablet (1 mg total) by mouth daily.    Marland Kitchen  loratadine-pseudoephedrine (CLARITIN-D 24-HOUR) 10-240 MG 24 hr tablet Take 1 tablet by mouth daily.    . Melatonin 3 MG TABS Take 3 mg by mouth at bedtime.    . methylPREDNISolone (MEDROL) 4 MG tablet Take 1 tablet (47m) x3 per day for 1 day, then 1 tablet x2 per day for 1 day, then 1 tablet per day for 1 day, then discontinue. 6 tablet 0  . naproxen sodium (ANAPROX) 220 MG tablet Take 220 mg by mouth 2 (two) times daily with a meal.    . palbociclib (IBRANCE) 100 MG capsule Take 1 capsule (100 mg total) by mouth daily with breakfast. Take whole with food. 21 capsule 6  . pentoxifylline (TRENTAL) 400 MG CR tablet Take 1 tablet (400 mg total) by mouth 2 (two) times daily. Take 400 units vitamin E twice a day with this medication. 60 tablet 3  . phenazopyridine (PYRIDIUM) 200 MG tablet Take 1 tablet (200 mg total) by mouth 3 (three) times daily as needed for pain. 30 tablet 1  . RABEprazole Sodium 5 MG CPSP Take 5 mg by mouth daily. 30 capsule 3  . saccharomyces boulardii (FLORASTOR) 250 MG capsule Take 250 mg by mouth daily.     . vitamin E (  VITAMIN E) 400 UNIT capsule Take 1 capsule (400 Units total) by mouth 2 (two) times daily. 60 capsule 3   No current facility-administered medications for this encounter.     Physical Findings:  vitals were not taken for this visit. The patient is in no acute distress. Patient is alert and oriented. Accompanied by husband on evaluation today. The patient refused to have vital signs or her weight taken.   Lab Findings: Lab Results  Component Value Date   WBC 1.8 (L) 05/16/2016   HGB 10.7 (L) 05/16/2016   HCT 29.7 (L) 05/16/2016   MCV 106.1 (H) 05/16/2016   PLT 181 05/16/2016     Radiographic Findings: Mr Pelvis W Wo Contrast  Addendum Date: 05/15/2016   ADDENDUM REPORT: 05/15/2016 09:04 ADDENDUM: This addendum is given for the purpose of comparing the current study to pelvic MRI and 12/12/2015. Right sacral lesion series 21 image 9 measures 1.8 cm  transverse compared to 1.4 cm. Left sacral lesion on the same image measures 1.5 cm compared to 1.4 cm. Right acetabular roof lesion on image 13 of series 21 measures 1.0 cm compared to 0.9 cm. Posterior right acetabular lesion on image 17 measures 0.8 cm compared to 0.6 cm. The lesion does not appear to enhance as avidly as on the prior exam. Lesion in the anterior right acetabulum measuring 1.9 cm transverse on image 17 of series 21 is not definitely seen on the prior exam. Right superior pubic ramus lesion on image 17 of series 121 21 measures 0.8 cm, unchanged. Abnormal marrow signal throughout the right inferior pubic ramus with enhancement is not notably changed. Overall, there has been mild progression of metastatic disease. Electronically Signed   By: Inge Rise M.D.   On: 05/15/2016 09:04   Result Date: 05/15/2016 CLINICAL DATA:  Patient with a history of metastatic breast cancer. Pain about the pubic ramus. EXAM: MRI PELVIS WITHOUT AND WITH CONTRAST TECHNIQUE: Multiplanar multisequence MR imaging of the pelvis was performed both before and after administration of intravenous contrast. CONTRAST:  15 ml MULTIHANCE GADOBENATE DIMEGLUMINE 529 MG/ML IV SOLN COMPARISON:  PET CT scan 02/22/2016. FINDINGS: Abnormal signal and enhancement are seen throughout the right inferior pubic ramus consistent with metastatic disease. A small focus of abnormal signal and enhancement in the posterior right acetabulum and right superior pubic ramus are also consistent with metastatic disease. A larger deposit in the anterior right acetabulum measures 2.0 cm transverse by 1.7 cm AP on image 17 of series 21. Additional smaller metastatic deposits are seen scattered throughout the pelvis including the sacrum. Metastatic deposits are also identified in the L3 and L4 vertebral bodies, larger in L3 with the lesion measures 3.1 cm transverse by 1.6 cm craniocaudal. Minimal inferior endplate compression fracture anteriorly in  L3 is identified. There is no fracture. No avascular necrosis of the femoral heads. Hips, sacroiliac joints and symphysis pubis appear normal. Musculature is intact. No evidence of bursitis. IMPRESSION: Multifocal osseous metastases throughout the pelvis, largest in the right inferior pubic ramus and anterior right acetabulum. Metastatic deposits in the L3 and L4 vertebral bodies are also identified. Very mild inferior endplate compression fracture of L3 is seen in the coronal plane only. Electronically Signed: By: Inge Rise M.D. On: 05/08/2016 14:47   Mr Abdomen W Wo Contrast  Result Date: 05/08/2016 CLINICAL DATA:  Metastatic breast cancer with known left pleural, hepatic, and abdominal nodal metastasis. EXAM: MRI ABDOMEN WITHOUT AND WITH CONTRAST TECHNIQUE: Multiplanar multisequence MR imaging of the  abdomen was performed both before and after the administration of intravenous contrast. CONTRAST:  55m MULTIHANCE GADOBENATE DIMEGLUMINE 529 MG/ML IV SOLN COMPARISON:  PET of 02/22/2016 and abdominal MRI of 02/14/2016. FINDINGS: Lower chest: Normal heart size. Re- demonstrated is left-sided pleural metastasis. Rind of T2 hyperintense left pleural thickening medially measures 1.5 cm on image 18/series 11 versus 1.3 cm at the same level on the prior. More cephalad medial posterior enhancing left pleural nodule measures 1.5 cm on image 17/ series 17002 and is unchanged. More anterior left pleural based nodule measures 1.4 cm on image 18/ series 17002 and is enlarged since the prior. Hepatobiliary: Hepatic metastasis are best evaluated on series 17002. Index central right hepatic lobe lesion measures 2.7 x 2.6 cm on image 40 versus 2.7 x 2.2 cm on the prior. A more peripheral right sided mass measures 4.9 x 3.9 cm on image 44 today versus 4.5 x 3.8 cm on the prior. A pericholecystic right liver lobe lesion measures 2.9 x 2.1 cm on image 52 today versus 1.8 x 1.6 cm on the prior. Far inferior right hepatic lobe  lesion measures 1.6 cm on image 70 today versus 1.2 cm on the prior. Cholelithiasis without acute cholecystitis or biliary duct dilatation. Pancreas:  Normal, without mass or ductal dilatation. Spleen:  Normal in size, without focal abnormality. Adrenals/Urinary Tract: Normal adrenal glands. Normal kidneys, without hydronephrosis. Stomach/Bowel: Normal stomach and abdominal bowel loops. Vascular/Lymphatic: Aortic atherosclerosis. A circumaortic left renal vein. Portacaval node measures 8 mm on image 26/series 11 compared to 7 mm on the prior. Not pathologic by size criteria. Left abdominal retroperitoneal node measures 11 x 9 mm on image 51/series 1202 and is not significantly changed. Other:  No ascites.  No evidence of omental or peritoneal disease. Musculoskeletal: Osseous metastasis again identified. A right-sided T11 lesion measures 1.5 cm on image 23/series 11 versus 1.9 cm on the prior. Right-sided lamina lesion at L1 measures 11 mm on image 36/ series 11 and is unchanged. Inferior L3 lesion measures 3.1 x 1.9 cm on image 28/series 18. Compare 3.3 x 1.7 cm on the prior, similar. IMPRESSION: 1. Since the prior MRI of 02/14/2016, progression of hepatic metastasis. 2. Similar to mild progression of left pleural metastasis, incompletely imaged. 3. Similar borderline to mild abdominal adenopathy. 4. Similar to mild improvement in osseous metastasis. 5. Cholelithiasis. Electronically Signed   By: KAbigail MiyamotoM.D.   On: 05/08/2016 13:29   Nm Pet Image Restag (ps) Skull Base To Thigh  Result Date: 05/14/2016 CLINICAL DATA:  Subsequent treatment strategy for restaging of right-sided breast cancer with bone, liver, left pleural, and abdominal nodal metastasis. EXAM: NUCLEAR MEDICINE PET SKULL BASE TO THIGH TECHNIQUE: 8.0 mCi F-18 FDG was injected intravenously. Full-ring PET imaging was performed from the skull base to thigh after the radiotracer. CT data was obtained and used for attenuation correction and  anatomic localization. FASTING BLOOD GLUCOSE:  Value: 98 mg/dl COMPARISON:  PET of 02/22/2016.  Abdominal MRI of 05/08/2016. FINDINGS: NECK No hypermetabolic nodes within the neck.  No cervical adenopathy. CHEST A new medial left subpectoral node measures 5 mm and a S.U.V. max of 5.1 on image 43/series 4. The previously described hypermetabolic more lateral left subpectoral node measures 4 mm and a S.U.V. max of 1.5 today versus 6 mm and a S.U.V. max of 2.0 on the prior exam. Index prevascular node measures 12 mm and a S.U.V. max of 8.8 today versus 11 mm and a S.U.V. max of 10.9 on  the prior exam. Left internal mammary node measures 6 mm and a S.U.V. max of 5.2 on image 59/ series 4. Compare 8 mm and a S.U.V. max of 5.9 on the prior. Left-sided mediastinal hypermetabolism measures a S.U.V. max of 5.8 on approximately image 56/ series 4. Compare a S.U.V. max of 6.5 on the prior. Index anterior left pleural lesion measures 13 mm and a S.U.V. max of 8.4 on image 77/series 4. Compare 12 mm and a S.U.V. max of 8.3 on the prior. Right axillary node dissection. Left-sided pleural thickening and trace fluid are similar. Minimal right upper lobe nodularity on image 10/series 8 is not readily apparent on the prior but favored to represent a subpleural lymph node. Left apical scarring and fibrosis with similar low-level hypermetabolism. Left lower lobe rounded atelectasis. ABDOMEN/PELVIS Dominant right hepatic lobe subcapsular mass measures 5.0 cm and a S.U.V. max of 11.4 on image 92/series 4. Compare 4.7 cm and a S.U.V. max of 13.1 on the prior exam. More central right hepatic lobe lesion measures 2.3 cm and a S.U.V. max of 7.6 on image 91/series 4. Compare 2.4 cm and a S.U.V. max of 7.5 on the prior. Portal caval node measures 9 mm and a S.U.V. max of 6.3 today versus 8 mm and a S.U.V. max of 6.3 on the prior. Left retroperitoneal hypermetabolism measures a S.U.V. max of 3.4 today versus a S.U.V. max of 3.5 on the prior.  Aortic atherosclerosis. Circumaortic left renal vein. Mild pelvic floor laxity. SKELETON A new focus of hypermetabolism within the C4 vertebral body measures a S.U.V. max of 5.5. Hypermetabolism about the right side of the T11 vertebral body measures a S.U.V. max of 3.2 today versus a S.U.V. max of 6.2 on the prior exam. Hypermetabolism about the right inferior pubic ramus measures a S.U.V. max of 4.1 today versus a S.U.V. max of 4.3 on the prior. Subtle nondisplaced fracture in this region is more apparent today. Example image 174/series 4. IMPRESSION: 1. Since the prior PET of 02/22/2016, overall mixed response to therapy. 2. The majority of sites of disease within the chest, including nodal stations and the left pleural space, or similar to minimally improved. There is a new medial left subpectoral hypermetabolic node with resolution of hypermetabolism in a more lateral left subpectoral node. 3. Similar disease within abdominal nodal stations and the liver. 4. Further improvement in hypermetabolism involving the T11 vertebral body. A new focus of hypermetabolism within C4 is suspicious for a new metastasis. 5. Right inferior pubic ramus hypermetabolism is similar. Nondisplaced pathologic fracture is more apparent today. Electronically Signed   By: Abigail Miyamoto M.D.   On: 05/14/2016 15:58    Impression: We went over her recent imaging and her recent symptoms in detail. She has had some success with physical therapy in terms of some pelvic discomfort and we will continue to follow this. In terms of the patient's pelvic disease, no major changes in the area of the inferior pubic ramus on the right although the fracture was more apparent. She is getting some relief from physical therapy. I have recommended that the patient begin taking the medication Trental with vitamin E, which may help the recovery process. The area where she received radiosurgery to the spine recently looked good. The patient does have some  degree of progression within the liver and we discussed various options for treatment along these lines in detail. Given the size and number of tumors, she is not an ideal candidate in my opinion either  for ablative dose radiosurgery and also may be a difficult ablation candidate as well. She has discussed yttrium 90 treatment with medical oncology and she is going to discuss this further with interventional radiology. I believe that this is a reasonable potential treatment option. We did also discuss the patient's disease in the cervical spine where there was a new area without any associated symptoms. I believe this can continue to be followed. I would not advocate aggressively treating this area with radiosurgery currently for instance.  Plan:  The patient will consult with Dr. Pascal Lux in a few days concerning yttrium-90 for her liver lesions.  The patient will see medical oncology tomorrow. I would like to see the patient return to our clinic in several months for ongoing follow-up. ------------------------------------------------  [name] Gross, MD, PhD  This document serves as a record of services personally performed by Kyung Rudd, MD. It was created on his behalf by Darcus Austin, a trained medical scribe. The creation of this record is based on the scribe's personal observations and the provider's statements to them. This document has been checked and approved by the attending provider.

## 2016-05-15 NOTE — Progress Notes (Signed)
Follow up s/p rad T11 03/12/16, Pet scan results from yesterday, patient refused vitals and weight,  Using extra small dilator at times, pain in right groin musculature  To pelvic bone,  Wants top discuss pet scan, MRI  Pelvis,  done 05/08/16  Pin 3/10 scale, mild nausea stated, appetite  Not good, , eats breakfast and dinner only, drinking enough water,, constipation , bladder  stram comes out differnet, physical therapy 3x week, once week for internal, and 2x week for lymphedema 1:34 PM There were no vitals taken for this visit.

## 2016-05-15 NOTE — Therapy (Signed)
[place], [place], [address] Phone: [phone]   Fax:  [phone]  Physical Therapy Treatment  Patient Details  Name: [name] [name] MRN: [id number] Date of Birth: Dec 08, 1960 Referring Provider: Dr. Kyung Rudd  Encounter Date: 05/15/2016      PT End of Session - 05/15/16 1118    Visit Number 19  14 for lymph   Number of Visits 25  for lymph   Date for PT Re-Evaluation 06/25/16  lymph   Authorization - Number of Visits 140  for 2018   PT Start Time 1020   PT Stop Time 1106   PT Time Calculation (min) 46 min   Activity Tolerance Patient tolerated treatment well   Behavior During Therapy [facility] for tasks assessed/performed      Past Medical History:  Diagnosis Date  . Bone metastases ([place]) dx'd 05/2014  . Breast cancer ([place]) dx'd 2005/2011  . Peripheral vascular disease ([place]) 02/2010   blood clot related to porta cath  . PONV (postoperative nausea and vomiting)   . S/P radiation therapy 07/17/2014 through 08/02/2014    Left mediastinum, left seventh rib 3250 cGy in 13 sessions   . S/P radiation therapy 12/11/2014 through 12/22/2014    Left parietal calvarium 2400 cGy in 8 sessions   . Seizures ([place]) 2010   Isolated incident.    Past Surgical History:  Procedure Laterality Date  . AXILLARY LYMPH NODE DISSECTION  Dec. 2011  . BREAST LUMPECTOMY  2005  . MEDIASTINOTOMY CHAMBERLAIN MCNEIL Left 06/02/2013   Procedure: MEDIASTINOTOMY CHAMBERLAIN MCNEIL;  Surgeon: [name], MD;  Location: [facility];  Service: Thoracic;  Laterality: Left;  LEFT ANTERIOR MEDIASTINOTOMY   . PORTACATH PLACEMENT  12/11  . removal portacath      There were no vitals filed for this visit.      Subjective Assessment - 05/15/16 1021    Subjective "I'm  not good today."  --a lot on her mind   Currently in Pain? Yes   Pain Score 3    Pain Location Leg   Pain Orientation Right;Upper   Pain Score 6  5-6   Pain Location Axilla   Pain Orientation Right                         [facility] Adult PT Treatment/Exercise - 05/15/16 0001      Manual Therapy   Myofascial Release right axilla with focus on scar tightness   Manual Lymphatic Drainage (MLD) In left sidelying, posterior interaxillary anastomosis and right axillo-inguinal anastomosis; in supine, short neck, left axilla and anterior interaxillary anastomosis, right groin and axillo-inguinal anastomosis, and right upper extremity from fingers to shoulder.  In right sidelying, left periscapular area toward left groin; extra time spent on the latter today.   Other Manual Therapy soft tissue work at superior aspect of right medial and upper thigh in prone position for pain relief and muscle relaxation, gentle myofascial release at upper thigh, with releases today from far medial to lateral aspects of posterior thigh; extra time on pulling at upper thigh today                  PT Short Term Goals - 02/12/16 1226      PT SHORT TERM GOAL #1   Title pain with walking decreased >/= 25%   Time 4   Period Weeks   Status Achieved  PT Long Term Goals - 05/13/16 1319      PT LONG TERM GOAL #1   Title indpendent with HEP   Time 8   Period Weeks   Status On-going     PT LONG TERM GOAL #2   Title pain with walking decreased >/= 75%   Time 8   Period Weeks   Status On-going  60% after treatment     PT LONG TERM GOAL #5   Title reduction of pain by end of day >/= 50% due to increase tissue mobility   Time 8   Period Weeks   Status On-going  60% decreased after treatment but will go back           [facility] Goals - 05/02/16 1112      CC Long Term Goal  #2   Title Pt. will report swelling is adequately managed to enable ADL function at a  consistent level.   Status On-going     CC Long Term Goal  #4   Title Pain/discomfort at right axilla area will be controlled at 6/10 or less.   Status On-going     CC Long Term Goal  #5   Title Patient will avoid infection by ongoing management of her lymphedema at right breast/axilla/upper arm areas.   Status On-going     CC Long Term Goal  #6   Title Pain in right groin/ischial tuberosity area will be controlled at 3/10 level or less to enable walking with less pain.   Status On-going            Plan - 05/15/16 1119    Clinical Impression Statement Pt. upset and very talkative today about recent MRI results; she is also awaiting PET results from yesterday.  Meanwhile she continues to benefit from therapy.  Got good relief a day or two ago from treatment for pelvic issues   Rehab Potential Good   Clinical Impairments Affecting Rehab Potential active cancer   PT Frequency 2x / week   PT Duration 12 weeks   PT Treatment/Interventions ADLs/Self Care Home Management;Therapeutic activities;Therapeutic exercise;Patient/family education;Manual techniques;Manual lymph drainage;Scar mobilization   PT Next Visit Plan soft tissue work, myofascial release, manual lymph drainage for cancer rehab   Consulted and Agree with Plan of Care Patient   PT Plan .      Patient will benefit from skilled therapeutic intervention in order to improve the following deficits and impairments:  Increased edema, Increased fascial restricitons, Pain  Visit Diagnosis: Pain in right thigh  Lymphedema, not elsewhere classified     Problem List Patient Active Problem List   Diagnosis Date Noted  . Liver metastases ([place]) 02/23/2016  . 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 Z[place] 04/04/2015  . Nausea with vomiting 11/18/2014  . Constipation 11/18/2014  . Left-sided thoracic back pain   . Bone metastases ([place]) 11/16/2014  . Back pain 11/15/2014  . Uncontrolled pain 11/14/2014  .  Post-lymphadenectomy lymphedema of arm 05/31/2014  . Chest wall pain 03/21/2014  . Abnormal LFTs (liver function tests) 09/12/2013  . Malignant neoplasm of upper-inner quadrant of right breast in female, estrogen receptor positive ([place]) 08/18/2013  . 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

## 2016-05-16 ENCOUNTER — Ambulatory Visit (HOSPITAL_BASED_OUTPATIENT_CLINIC_OR_DEPARTMENT_OTHER): Payer: 59 | Admitting: Oncology

## 2016-05-16 ENCOUNTER — Encounter: Payer: 59 | Admitting: Physical Therapy

## 2016-05-16 ENCOUNTER — Telehealth: Payer: Self-pay | Admitting: Oncology

## 2016-05-16 ENCOUNTER — Other Ambulatory Visit: Payer: Self-pay | Admitting: *Deleted

## 2016-05-16 ENCOUNTER — Other Ambulatory Visit (HOSPITAL_BASED_OUTPATIENT_CLINIC_OR_DEPARTMENT_OTHER): Payer: 59

## 2016-05-16 ENCOUNTER — Telehealth: Payer: Self-pay | Admitting: *Deleted

## 2016-05-16 DIAGNOSIS — C50211 Malignant neoplasm of upper-inner quadrant of right female breast: Secondary | ICD-10-CM | POA: Diagnosis not present

## 2016-05-16 DIAGNOSIS — C771 Secondary and unspecified malignant neoplasm of intrathoracic lymph nodes: Secondary | ICD-10-CM

## 2016-05-16 DIAGNOSIS — M859 Disorder of bone density and structure, unspecified: Secondary | ICD-10-CM

## 2016-05-16 DIAGNOSIS — C787 Secondary malignant neoplasm of liver and intrahepatic bile duct: Secondary | ICD-10-CM

## 2016-05-16 DIAGNOSIS — Z170 Estrogen receptor positive status [ER+]: Secondary | ICD-10-CM

## 2016-05-16 DIAGNOSIS — R309 Painful micturition, unspecified: Secondary | ICD-10-CM

## 2016-05-16 DIAGNOSIS — I890 Lymphedema, not elsewhere classified: Secondary | ICD-10-CM

## 2016-05-16 DIAGNOSIS — C7951 Secondary malignant neoplasm of bone: Secondary | ICD-10-CM

## 2016-05-16 LAB — CBC WITH DIFFERENTIAL/PLATELET
BASO%: 2.2 % — AB (ref 0.0–2.0)
Basophils Absolute: 0 10*3/uL (ref 0.0–0.1)
EOS%: 1.1 % (ref 0.0–7.0)
Eosinophils Absolute: 0 10*3/uL (ref 0.0–0.5)
HCT: 29.7 % — ABNORMAL LOW (ref 34.8–46.6)
HEMOGLOBIN: 10.7 g/dL — AB (ref 11.6–15.9)
LYMPH#: 0.5 10*3/uL — AB (ref 0.9–3.3)
LYMPH%: 24.7 % (ref 14.0–49.7)
MCH: 38.2 pg — ABNORMAL HIGH (ref 25.1–34.0)
MCHC: 36 g/dL (ref 31.5–36.0)
MCV: 106.1 fL — ABNORMAL HIGH (ref 79.5–101.0)
MONO#: 0.3 10*3/uL (ref 0.1–0.9)
MONO%: 13.7 % (ref 0.0–14.0)
NEUT#: 1.1 10*3/uL — ABNORMAL LOW (ref 1.5–6.5)
NEUT%: 58.3 % (ref 38.4–76.8)
NRBC: 0 % (ref 0–0)
Platelets: 181 10*3/uL (ref 145–400)
RBC: 2.8 10*6/uL — ABNORMAL LOW (ref 3.70–5.45)
RDW: 14 % (ref 11.2–14.5)
WBC: 1.8 10*3/uL — AB (ref 3.9–10.3)

## 2016-05-16 LAB — URINALYSIS, MICROSCOPIC - CHCC
Bilirubin (Urine): NEGATIVE
Blood: NEGATIVE
GLUCOSE UR CHCC: NEGATIVE mg/dL
Ketones: NEGATIVE mg/dL
Nitrite: NEGATIVE
PH: 6.5 (ref 4.6–8.0)
PROTEIN: NEGATIVE mg/dL
SPECIFIC GRAVITY, URINE: 1.005 (ref 1.003–1.035)
UROBILINOGEN UR: 0.2 mg/dL (ref 0.2–1)

## 2016-05-16 MED ORDER — PENTOXIFYLLINE ER 400 MG PO TBCR: 400.0000 mg | EXTENDED_RELEASE_TABLET | Freq: Two times a day (BID) | ORAL | 3 refills | Status: DC

## 2016-05-16 MED ORDER — PHENAZOPYRIDINE HCL 100 MG PO TABS: 100.0000 mg | ORAL_TABLET | Freq: Three times a day (TID) | ORAL | 0 refills | Status: DC | PRN

## 2016-05-16 MED ORDER — CIPROFLOXACIN HCL 500 MG PO TABS: 500.0000 mg | ORAL_TABLET | Freq: Two times a day (BID) | ORAL | 0 refills | Status: DC

## 2016-05-16 MED ORDER — VITAMIN E 180 MG (400 UNIT) PO CAPS: 400.0000 [IU] | ORAL_CAPSULE | Freq: Two times a day (BID) | ORAL | 3 refills | Status: DC

## 2016-05-16 NOTE — Progress Notes (Signed)
[facility]  [phone] [phone]     ID: [name] [name] OB: 17-Nov-1960  MR#: [id number]  UTM#:[id number]  PCP: Pcp Not In System GYN:  [name] SU:  OTHER MD: [name], [name], [name], [name], [name], Kyung Rudd   CHIEF COMPLAINT: Stage IV breast cancer  CURRENT TREATMENT: Fulvestrant, denosumab, palbociclib  INTERVAL HISTORY:   [name] [name] returns today for follow-up of her estrogen receptor positive breast cancer. She is accompanied by her husband. Since her last visit here she had a restaging PET scan. Compared to prior there has not been that much change. The question particularly is how to deal with the liver involvement, which is the more immediately life-threatening site of disease.  She has been reading about transarterial chemoembolization, as well as various ablation procedures. She is very anxious about this possibility because her liver has been so sensitive to any treatments in the past including many of her chemotherapy treatments. She is scheduled to meet with INR next week to discuss this further.  Currently she is off the palbociclib since her counts have not quite recovered after her last cycle. She would like to drop the dose in any case because she is having more fatigue and she is willing to put up with at this point. Aside from that she tolerates it generally well.  She just had denies somatic and fulvestrant 05/07/2016 and her next dose will be 06/04/2016.  REVIEW OF SYiSTEMS: [name] [name] is having some change in bowel habits which she is managing resource fully. She is having mild dysuria and some frequency. She is still having problems with appetite and taste but her weight is stable. The pelvic pain is improved somewhat with continuing physical therapy. A detailed review of systems today was otherwise stable.  BREAST CANCER HISTORY: From doctor Bernell List Khan's intake note  03/20/2004:  "The patient is a very pleasant 56 year old female, without significant past medical history.  Her family history is significant for a sister who at age 61 was diagnosed with invasive ductal carcinoma.  She is a breast cancer survivor at age 19 now.  The patient states that she has never really had a screening mammogram until October 2005, when she felt that it was time for her to start having mammograms done on a yearly basis.  Therefore, on 01/26/04, she underwent a screening mammogram and an abnormality was detected in the upper outer right breast.  She, therefore, underwent spot compression views of both the right and the left breast.  The left breast revealed a well-defined mass in the upper outer left quadrant, present at the 2 o'clock position, measuring 1.8 cm, 6 cm from the nipple.  This, by ultrasound, was felt to be a simple cyst measuring 1.8 cm.  On the right breast, a spiculated mass was noted in the upper outer right quadrant.  The ultrasound revealed a shadowing irregular solid mass at the 10:30 position, 9 cm from the nipple, measuring 1.2 cm in greatest dimension, correlating with the spiculated mass seen on the mammogram.  The right axilla was negative ultrasonically.  Because of this, the patient underwent a needle biopsy of the right breast and the biopsy was positive invasive mammary carcinoma that showed features consistent with a high-grade invasive ductal carcinoma associated with desmoplastic stroma.  No in situ component was seen and no definite lymphovascular invasion was identified.  On the core biopsy, the tumor measured about 0.8 cm.  Because of this,  she was seen by Dr. [name] and the patient was taken to the [facility] on March 15, 2004.  She underwent a right breast lumpectomy with sentinel node biopsy.  The final pathology revealed an invasive ductal carcinoma, measuring 1.7 cm, grade 2 of 3.  Margins were free of tumor.  Atypical lobular hyperplasia was  noted.  One sentinel node was removed which was negative for metastatic disease.  The tumor was staged at T1c, N0 MX.  It was estrogen receptor positive, progesterone receptor positive.  HER-2/neu was 2+.  FISH was negative.  All margins were free of tumor.  She is now seen in Medical Oncology for further evaluation and management of this newly diagnosed T1c, node negative, stage I, invasive ductal carcinoma of the right breast."  Her subsequent history is as detailed below   PAST MEDICAL HISTORY: Past Medical History:  Diagnosis Date  . Bone metastases ([place]) dx'd 05/2014  . Breast cancer ([place]) dx'd 2005/2011  . Peripheral vascular disease ([place]) 02/2010   blood clot related to porta cath  . PONV (postoperative nausea and vomiting)   . S/P radiation therapy 07/17/2014 through 08/02/2014    Left mediastinum, left seventh rib 3250 cGy in 13 sessions   . S/P radiation therapy 12/11/2014 through 12/22/2014    Left parietal calvarium 2400 cGy in 8 sessions   . Seizures ([place]) 2010   Isolated incident.    PAST SURGICAL HISTORY: Past Surgical History:  Procedure Laterality Date  . AXILLARY LYMPH NODE DISSECTION  Dec. 2011  . BREAST LUMPECTOMY  2005  . MEDIASTINOTOMY CHAMBERLAIN MCNEIL Left 06/02/2013   Procedure: MEDIASTINOTOMY CHAMBERLAIN MCNEIL;  Surgeon: [name], MD;  Location: [facility];  Service: Thoracic;  Laterality: Left;  LEFT ANTERIOR MEDIASTINOTOMY   . PORTACATH PLACEMENT  12/11  . removal portacath      FAMILY HISTORY Family History  Problem Relation Age of Onset  . COPD Mother   . Breast cancer Sister 37   The patient's father is living, 42 years old as of may 2015. He lives in [place]. The patient's mother died from complications of COPD at the age of 19. These has 2 brothers, one sister. Her sister developed breast  cancer at the age of 37. She is doing well. The patient herself underwent genetic testing at [facility] in 2011 and was found to be BRCA negative  GYNECOLOGIC HISTORY:  Menarche age 89, she is GX P0. She stopped having periods with her initial chemotherapy in 2006.  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 Her husband [name] is a Medical illustrator in [place]. He has a child from a prior marriage. At home they have 2 rescue dogs, Hobo and [place]. The patient is religious but not a church attender    ADVANCED DIRECTIVES: In place; at the 08/04/2014 visit in particular the patient was very clear, with her husband present, that she would not want any kind of feeding tubes or "other tubes" if her condition deteriorated.   HEALTH MAINTENANCE: Social History  Substance Use Topics  . Smoking status: Never Smoker  . Smokeless tobacco: Never Used  . Alcohol use No     Colonoscopy:  PAP:  Bone density: March 2015; mild osteopenia  Lipid panel:  Allergies  Allergen Reactions  . 2nd Skin Quick Heal Other (See Comments)    Other Reaction: Skin peels  . Decadron [Dexamethasone] Other (See Comments)  Patient does not tolerate steroids.   . Dilaudid [Hydromorphone] Nausea And Vomiting  . Enoxaparin Other (See Comments)    unknown  . Fluconazole Swelling    Liver toxicity  . Hydromorphone Hcl Nausea And Vomiting  . Morphine And Related Nausea And Vomiting  . Protonix [Pantoprazole Sodium] Other (See Comments)    Patient reports it caused thrush.  . Tegaderm Ag Mesh [Silver]     Current Outpatient Prescriptions  Medication Sig Dispense Refill  . ALPRAZolam (XANAX) 0.5 MG tablet Take 1 tablet (0.5 mg total) by mouth 2 (two) times daily as needed for anxiety. 30 tablet 0  . B Complex-C (B-COMPLEX WITH VITAMIN C) tablet Take 1 tablet by mouth daily. Reported on 03/27/2015    . calcium carbonate (TUMS - DOSED IN MG  ELEMENTAL CALCIUM) 500 MG chewable tablet Chew 1 tablet by mouth as directed.    . cholecalciferol 2000 UNITS tablet Take 1 tablet (2,000 Units total) by mouth daily.    . ciprofloxacin (CIPRO) 500 MG tablet Take 1 tablet (500 mg total) by mouth 2 (two) times daily. 10 tablet 0  . Diphenhyd-Hydrocort-Nystatin (FIRST-DUKES MOUTHWASH) SUSP 5-10 ml qid SWISH AND SPIT 376 mL 3  . folic acid (FOLVITE) 1 MG tablet Take 1 tablet (1 mg total) by mouth daily.    Marland Kitchen loratadine-pseudoephedrine (CLARITIN-D 24-HOUR) 10-240 MG 24 hr tablet Take 1 tablet by mouth daily.    . Melatonin 3 MG TABS Take 3 mg by mouth at bedtime.    . methylPREDNISolone (MEDROL) 4 MG tablet Take 1 tablet (69m) x3 per day for 1 day, then 1 tablet x2 per day for 1 day, then 1 tablet per day for 1 day, then discontinue. 6 tablet 0  . naproxen sodium (ANAPROX) 220 MG tablet Take 220 mg by mouth 2 (two) times daily with a meal.    . palbociclib (IBRANCE) 100 MG capsule Take 1 capsule (100 mg total) by mouth daily with breakfast. Take whole with food. 21 capsule 6  . pentoxifylline (TRENTAL) 400 MG CR tablet Take 1 tablet (400 mg total) by mouth 2 (two) times daily. Take 400 units vitamin E twice a day with this medication. 60 tablet 3  . phenazopyridine (PYRIDIUM) 100 MG tablet Take 1 tablet (100 mg total) by mouth 3 (three) times daily as needed for pain. 30 tablet 0  . phenazopyridine (PYRIDIUM) 200 MG tablet Take 1 tablet (200 mg total) by mouth 3 (three) times daily as needed for pain. 30 tablet 1  . RABEprazole Sodium 5 MG CPSP Take 5 mg by mouth daily. 30 capsule 3  . saccharomyces boulardii (FLORASTOR) 250 MG capsule Take 250 mg by mouth daily.     . vitamin E (VITAMIN E) 400 UNIT capsule Take 1 capsule (400 Units total) by mouth 2 (two) times daily. 60 capsule 3   No current facility-administered medications for this visit.     OBJECTIVE: Middle-aged white woman In no acute distress  There were no vitals filed for this visit.    There is no height or weight on file to calculate BMI.   Patient refused vitals 05/17/16    ECOG FS: 2  Sclerae unicteric, pupils round and equal Oropharynx clear and moist-- no thrush or other lesions No cervical or supraclavicular adenopathy Lungs no rales or rhonchi Heart regular rate and rhythm Abd soft, nontender, positive bowel sounds MSK no focal spinal tenderness, no upper extremity lymphedema Neuro: nonfocal, well oriented, appropriate affect Breasts: Deferred  LAB RESULTS:   CMP     Component Value Date/Time   NA 137 05/05/2016 1127   K 4.2 05/05/2016 1127   CL 103 12/14/2014 0800   CL 105 05/06/2012 1333   CO2 27 05/05/2016 1127   GLUCOSE 89 05/05/2016 1127   GLUCOSE 124 (H) 05/06/2012 1333   BUN 8.7 05/05/2016 1127   CREATININE 0.9 05/05/2016 1127   CALCIUM 9.9 05/05/2016 1127   PROT 7.5 05/05/2016 1127   ALBUMIN 4.2 05/05/2016 1127   AST 32 05/05/2016 1127   ALT 38 05/05/2016 1127   ALKPHOS 80 05/05/2016 1127   BILITOT 0.38 05/05/2016 1127   GFRNONAA >60 12/14/2014 0800   GFRAA >60 12/14/2014 0800    No results found for: SPEP  Lab Results  Component Value Date   WBC 1.8 (L) 05/16/2016   NEUTROABS 1.1 (L) 05/16/2016   HGB 10.7 (L) 05/16/2016   HCT 29.7 (L) 05/16/2016   MCV 106.1 (H) 05/16/2016   PLT 181 05/16/2016      Chemistry      Component Value Date/Time   NA 137 05/05/2016 1127   K 4.2 05/05/2016 1127   CL 103 12/14/2014 0800   CL 105 05/06/2012 1333   CO2 27 05/05/2016 1127   BUN 8.7 05/05/2016 1127   CREATININE 0.9 05/05/2016 1127      Component Value Date/Time   CALCIUM 9.9 05/05/2016 1127   ALKPHOS 80 05/05/2016 1127   AST 32 05/05/2016 1127   ALT 38 05/05/2016 1127   BILITOT 0.38 05/05/2016 1127     No results for input(s): INR in the last 168 hours.  Urinalysis    Component Value Date/Time   COLORURINE YELLOW 11/17/2014 0143   APPEARANCEUR CLOUDY (A) 11/17/2014 0143   LABSPEC 1.005 05/16/2016 1201   PHURINE  6.5 05/16/2016 1201   PHURINE 6.5 11/17/2014 0143   GLUCOSEU Negative 05/16/2016 1201   HGBUR Negative 05/16/2016 1201   HGBUR NEGATIVE 11/17/2014 0143   BILIRUBINUR Negative 05/16/2016 1201   KETONESUR Negative 05/16/2016 1201   KETONESUR NEGATIVE 11/17/2014 0143   PROTEINUR Negative 05/16/2016 1201   PROTEINUR NEGATIVE 11/17/2014 0143   UROBILINOGEN 0.2 05/16/2016 1201   NITRITE Negative 05/16/2016 1201   NITRITE NEGATIVE 11/17/2014 0143   LEUKOCYTESUR Small 05/16/2016 1201    Ref Range & Units 9d ago 39moago 279mogo    CA 27.29 0.0 - 38.6 U/mL 2,610.0   2,652.0CM   2,463.9CM    Comments: Specimen      STUDIES: Mr Pelvis W Wo Contrast  Addendum Date: 05/15/2016   ADDENDUM REPORT: 05/15/2016 09:04 ADDENDUM: This addendum is given for the purpose of comparing the current study to pelvic MRI and 12/12/2015. Right sacral lesion series 21 image 9 measures 1.8 cm transverse compared to 1.4 cm. Left sacral lesion on the same image measures 1.5 cm compared to 1.4 cm. Right acetabular roof lesion on image 13 of series 21 measures 1.0 cm compared to 0.9 cm. Posterior right acetabular lesion on image 17 measures 0.8 cm compared to 0.6 cm. The lesion does not appear to enhance as avidly as on the prior exam. Lesion in the anterior right acetabulum measuring 1.9 cm transverse on image 17 of series 21 is not definitely seen on the prior exam. Right superior pubic ramus lesion on image 17 of series 121 21 measures 0.8 cm, unchanged. Abnormal marrow signal throughout the right inferior pubic ramus with enhancement is not notably changed. Overall, there has been mild progression of  metastatic disease. Electronically Signed   By: Inge Rise M.D.   On: 05/15/2016 09:04   Result Date: 05/15/2016 CLINICAL DATA:  Patient with a history of metastatic breast cancer. Pain about the pubic ramus. EXAM: MRI PELVIS WITHOUT AND WITH CONTRAST TECHNIQUE: Multiplanar multisequence MR imaging of the pelvis was  performed both before and after administration of intravenous contrast. CONTRAST:  15 ml MULTIHANCE GADOBENATE DIMEGLUMINE 529 MG/ML IV SOLN COMPARISON:  PET CT scan 02/22/2016. FINDINGS: Abnormal signal and enhancement are seen throughout the right inferior pubic ramus consistent with metastatic disease. A small focus of abnormal signal and enhancement in the posterior right acetabulum and right superior pubic ramus are also consistent with metastatic disease. A larger deposit in the anterior right acetabulum measures 2.0 cm transverse by 1.7 cm AP on image 17 of series 21. Additional smaller metastatic deposits are seen scattered throughout the pelvis including the sacrum. Metastatic deposits are also identified in the L3 and L4 vertebral bodies, larger in L3 with the lesion measures 3.1 cm transverse by 1.6 cm craniocaudal. Minimal inferior endplate compression fracture anteriorly in L3 is identified. There is no fracture. No avascular necrosis of the femoral heads. Hips, sacroiliac joints and symphysis pubis appear normal. Musculature is intact. No evidence of bursitis. IMPRESSION: Multifocal osseous metastases throughout the pelvis, largest in the right inferior pubic ramus and anterior right acetabulum. Metastatic deposits in the L3 and L4 vertebral bodies are also identified. Very mild inferior endplate compression fracture of L3 is seen in the coronal plane only. Electronically Signed: By: Inge Rise M.D. On: 05/08/2016 14:47   Mr Abdomen W Wo Contrast  Result Date: 05/08/2016 CLINICAL DATA:  Metastatic breast cancer with known left pleural, hepatic, and abdominal nodal metastasis. EXAM: MRI ABDOMEN WITHOUT AND WITH CONTRAST TECHNIQUE: Multiplanar multisequence MR imaging of the abdomen was performed both before and after the administration of intravenous contrast. CONTRAST:  57m MULTIHANCE GADOBENATE DIMEGLUMINE 529 MG/ML IV SOLN COMPARISON:  PET of 02/22/2016 and abdominal MRI of 02/14/2016.  FINDINGS: Lower chest: Normal heart size. Re- demonstrated is left-sided pleural metastasis. Rind of T2 hyperintense left pleural thickening medially measures 1.5 cm on image 18/series 11 versus 1.3 cm at the same level on the prior. More cephalad medial posterior enhancing left pleural nodule measures 1.5 cm on image 17/ series 17002 and is unchanged. More anterior left pleural based nodule measures 1.4 cm on image 18/ series 17002 and is enlarged since the prior. Hepatobiliary: Hepatic metastasis are best evaluated on series 17002. Index central right hepatic lobe lesion measures 2.7 x 2.6 cm on image 40 versus 2.7 x 2.2 cm on the prior. A more peripheral right sided mass measures 4.9 x 3.9 cm on image 44 today versus 4.5 x 3.8 cm on the prior. A pericholecystic right liver lobe lesion measures 2.9 x 2.1 cm on image 52 today versus 1.8 x 1.6 cm on the prior. Far inferior right hepatic lobe lesion measures 1.6 cm on image 70 today versus 1.2 cm on the prior. Cholelithiasis without acute cholecystitis or biliary duct dilatation. Pancreas:  Normal, without mass or ductal dilatation. Spleen:  Normal in size, without focal abnormality. Adrenals/Urinary Tract: Normal adrenal glands. Normal kidneys, without hydronephrosis. Stomach/Bowel: Normal stomach and abdominal bowel loops. Vascular/Lymphatic: Aortic atherosclerosis. A circumaortic left renal vein. Portacaval node measures 8 mm on image 26/series 11 compared to 7 mm on the prior. Not pathologic by size criteria. Left abdominal retroperitoneal node measures 11 x 9 mm on image 51/series 1202  and is not significantly changed. Other:  No ascites.  No evidence of omental or peritoneal disease. Musculoskeletal: Osseous metastasis again identified. A right-sided T11 lesion measures 1.5 cm on image 23/series 11 versus 1.9 cm on the prior. Right-sided lamina lesion at L1 measures 11 mm on image 36/ series 11 and is unchanged. Inferior L3 lesion measures 3.1 x 1.9 cm on image  28/series 18. Compare 3.3 x 1.7 cm on the prior, similar. IMPRESSION: 1. Since the prior MRI of 02/14/2016, progression of hepatic metastasis. 2. Similar to mild progression of left pleural metastasis, incompletely imaged. 3. Similar borderline to mild abdominal adenopathy. 4. Similar to mild improvement in osseous metastasis. 5. Cholelithiasis. Electronically Signed   By: Abigail Miyamoto M.D.   On: 05/08/2016 13:29   Nm Pet Image Restag (ps) Skull Base To Thigh  Result Date: 05/14/2016 CLINICAL DATA:  Subsequent treatment strategy for restaging of right-sided breast cancer with bone, liver, left pleural, and abdominal nodal metastasis. EXAM: NUCLEAR MEDICINE PET SKULL BASE TO THIGH TECHNIQUE: 8.0 mCi F-18 FDG was injected intravenously. Full-ring PET imaging was performed from the skull base to thigh after the radiotracer. CT data was obtained and used for attenuation correction and anatomic localization. FASTING BLOOD GLUCOSE:  Value: 98 mg/dl COMPARISON:  PET of 02/22/2016.  Abdominal MRI of 05/08/2016. FINDINGS: NECK No hypermetabolic nodes within the neck.  No cervical adenopathy. CHEST A new medial left subpectoral node measures 5 mm and a S.U.V. max of 5.1 on image 43/series 4. The previously described hypermetabolic more lateral left subpectoral node measures 4 mm and a S.U.V. max of 1.5 today versus 6 mm and a S.U.V. max of 2.0 on the prior exam. Index prevascular node measures 12 mm and a S.U.V. max of 8.8 today versus 11 mm and a S.U.V. max of 10.9 on the prior exam. Left internal mammary node measures 6 mm and a S.U.V. max of 5.2 on image 59/ series 4. Compare 8 mm and a S.U.V. max of 5.9 on the prior. Left-sided mediastinal hypermetabolism measures a S.U.V. max of 5.8 on approximately image 56/ series 4. Compare a S.U.V. max of 6.5 on the prior. Index anterior left pleural lesion measures 13 mm and a S.U.V. max of 8.4 on image 77/series 4. Compare 12 mm and a S.U.V. max of 8.3 on the prior. Right  axillary node dissection. Left-sided pleural thickening and trace fluid are similar. Minimal right upper lobe nodularity on image 10/series 8 is not readily apparent on the prior but favored to represent a subpleural lymph node. Left apical scarring and fibrosis with similar low-level hypermetabolism. Left lower lobe rounded atelectasis. ABDOMEN/PELVIS Dominant right hepatic lobe subcapsular mass measures 5.0 cm and a S.U.V. max of 11.4 on image 92/series 4. Compare 4.7 cm and a S.U.V. max of 13.1 on the prior exam. More central right hepatic lobe lesion measures 2.3 cm and a S.U.V. max of 7.6 on image 91/series 4. Compare 2.4 cm and a S.U.V. max of 7.5 on the prior. Portal caval node measures 9 mm and a S.U.V. max of 6.3 today versus 8 mm and a S.U.V. max of 6.3 on the prior. Left retroperitoneal hypermetabolism measures a S.U.V. max of 3.4 today versus a S.U.V. max of 3.5 on the prior. Aortic atherosclerosis. Circumaortic left renal vein. Mild pelvic floor laxity. SKELETON A new focus of hypermetabolism within the C4 vertebral body measures a S.U.V. max of 5.5. Hypermetabolism about the right side of the T11 vertebral body measures a S.U.V. max  of 3.2 today versus a S.U.V. max of 6.2 on the prior exam. Hypermetabolism about the right inferior pubic ramus measures a S.U.V. max of 4.1 today versus a S.U.V. max of 4.3 on the prior. Subtle nondisplaced fracture in this region is more apparent today. Example image 174/series 4. IMPRESSION: 1. Since the prior PET of 02/22/2016, overall mixed response to therapy. 2. The majority of sites of disease within the chest, including nodal stations and the left pleural space, or similar to minimally improved. There is a new medial left subpectoral hypermetabolic node with resolution of hypermetabolism in a more lateral left subpectoral node. 3. Similar disease within abdominal nodal stations and the liver. 4. Further improvement in hypermetabolism involving the T11 vertebral  body. A new focus of hypermetabolism within C4 is suspicious for a new metastasis. 5. Right inferior pubic ramus hypermetabolism is similar. Nondisplaced pathologic fracture is more apparent today. Electronically Signed   By: Abigail Miyamoto M.D.   On: 05/14/2016 15:58    ASSESSMENT: 56 y.o. BRCA negative [place] woman with stage IV breast cancer, history as follows  (1)  S/p Right upper inner quadrant lumpectomy and sentinel lymph node sampling 03/15/2004 for a pT1c pN0. Stage IA invasive ductal carcinoma, grade 2, estrogen receptor 95% positive, progesterone receptor 65% positive, HER-2 not amplified; additional surgery 04/25/2004 for seroma or clearance showed no residual tumor  (2) adjuvant chemotherapy with cyclophosphamide and doxorubicin every 21 days x4 completed 07/19/2004  (3) adjuvant radiation given under Dr. Donella Stade in [place] completed July 2006  (4) the patient opted against adjuvant antiestrogen therapy  (5) genetics testing showed no BRCA mutations  (6) biopsy of a palpable right axillary mass 10/24/2009 showed invasive ductal carcinoma, grade 3, estrogen receptor 100% positive, progesterone receptor 2% positive (alert score 5) HER-2 negative; no evidence of systemic disease on PET scanning  (7) completed 3 of 4 planned cycles of docetaxel and cyclophosphamide September 2011, fourth cycle omitted because of marked elevations in liver function tests  (8) an right axillary lymph node dissection 03/06/2010 showed 3/8 lymph nodes removed to be involved by tumor, with extracapsular extension.  (9) 45 Gy radiation to the right axillary and right supraclavicular nodal areas, with capecitabine sensitization, completed March 2012   (10) intolerant of letrozole and exemestane; on tamoxifen with interruptions September 2012 to March 2013, but then continuing on tamoxifen more continuously through March of 2015  (11) biopsy of mediastinal adenopathy 06/02/2013 shows invasive ductal  carcinoma (gross cystic disease fluid protein positive, TTS-1 negative), estrogen receptor 80% positive, progesterone receptor 2% positive, HER-2 not amplified  (12) letrozole started March 2015-- tolerated with significant side effects, discontinued at the end of May 2015  (13) PET scan 08/16/2013 shows extensive left pleural metastatic disease and a large left pleural effusion that shifts cardiac and mediastinal structures to the right; adenopathy (celiac trunk, periadrenal, periaortic); and a left medial clavicular lesion; Status post left thoracentesis 08/16/2013 positive for adenocarcinoma, estrogen receptor positive, progesterone receptor negative.  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; everolimus was suggested as an available targeted agent  (18) exemestane started 03/31/2014, discontinued  10/31/2014 with evidence of progression  (a) everolimus added 04/03/2014 but not tolerated (cytopenias, elevated LFTs) even at minimal doses; stopped 04/17/2014  (19) fulvestrant started 12/20/2014  (a) palbociclib added at very low dose 04/03/2015 (starting dose 75 mg weekly)  (b) palbociclib dose gradually increased to 75 mg daily, 21/7, as of May 2017  (c)  palbociclib dose increased to 100 mg daily, 21/ 7, beginning November mid- cycle  (20) liver biopsy 03/20/2015 confirms metastatic carcinoma, still estrogen receptor positive at 100%, progesterone receptor negative, HER-2 equivocal with a signals ratio 1.41, number per cell  4.50.   (a) repeat liver biopsy December 2017 might show further changes (HER-2 positivity)  (21) immunohistochemistry for mismatched repair protein mutations 03/20/2015 showed normal major and minor MMR proteins, with a very low probability of microsatellite instability ([id number])  (22) adjuvant radiation 12/24/15-01/02/16 Site/dose:   1) Left T9 Rib / 24 Gy in 8 fx                         2) Right inferior pelvis/ 24 Gy in 8 fx  (23) consider pembrolizumab (obtained compassionate release from company)    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 treated with Lovenox for 5-6 months  (c) right upper extremity lymphedema--receiving physical therapy  (d) hepatic steatosis with chronically elevated LFTs as well as unusual hepatic sensitivity to chemotherapy  (e) osteopenia with the lowest T score -1.6 on bone density scan 06/20/2013  (i) on denosumab/ Xgeva Q28d  (f) radiation oncology (Dr Valere Dross) has reviewed prior radiation records in case there is further mediastinal involvement with dysphagia etc in which case palliative XRT could be considered  (a) radiation to left mediastinum/ left 7th rib 3250 cGy in 13 sessions04/18/2016 through 08/02/2014  (b) radiation to T11 area: 22 Gy in 7 sessions, last dose 11/27/2014  (c) radiation left parietal scalp region to be completed 12/22/2014  (d) radiation to sacral area completed 04/09/2015  (e) radiation to right inferior pelvis and left ninth rib (24 gray, 12/24/2015--01/02/2016)  (f) T11 was treated stereotactically with 14 gray in 1 fraction 03/12/2016.  (g) chest wall and perineal pain--improved post radiation treatments  (a) discussed celebrex/ carafate but holding off for now  (h) zoster diagnosed 04/04/2015-- on valacyclovir   PLAN: We reviewed her PET scan in detail. In some ways she sees this as  encouraging in the sense that there is really not much change compared to prior and of course she is now 6-1/2 years out from her pathologically documented disease recurrence/metastatic disease and still has a good functional status  We are still not ready for a definitive plan with Shar[facility] and we discussed that this extensively. She is concerned about proceeding with transarterial chemoembolization, but is more interested in ablation of a single lesion and then if she tell her I said well perhaps doing another lesion. She is already scheduled to discuss all this with Dr. Pascal Lux next week and she wanted me to call him and alerted him regarding her concerns. I will be glad to do that of course.  We have PD1 and PDL1 studies on her liver biopsy still pending. If positive we can consider pembrolizumab.  Her counts will not allow [place] to resume palbociclib at this point. We may need to switch to a ribociclib or abemaciclib which have slightly different side effect profiles. If we  do continue on palbociclib we will have to decrease the dose back to 75 mg either daily or every other day.  She had some urinary symptoms which we are going to treat with Cipro after obtaining a urine culture today  Otherwise she will see me again in 1 week. She will have met with interventional radiology by then and we will have the daily 1 studies available. Should be able to make a definitive decision regarding how to proceed   MAGRINAT,GUSTAV C, MD   05/17/2016 10:00 AM

## 2016-05-16 NOTE — Telephone Encounter (Signed)
lvm to inform pt of time change for appt 2/23 at 345 pm due to GM being out of that office that afternoon

## 2016-05-16 NOTE — Telephone Encounter (Signed)
This RN spoke with pt per UA results including prescription that is being sent to her pharmacy.

## 2016-05-19 ENCOUNTER — Encounter (HOSPITAL_COMMUNITY): Payer: Self-pay

## 2016-05-19 ENCOUNTER — Encounter: Payer: Self-pay | Admitting: Oncology

## 2016-05-19 ENCOUNTER — Other Ambulatory Visit: Payer: 59

## 2016-05-19 ENCOUNTER — Ambulatory Visit: Payer: 59 | Admitting: Physical Therapy

## 2016-05-19 DIAGNOSIS — M79651 Pain in right thigh: Secondary | ICD-10-CM

## 2016-05-19 DIAGNOSIS — I890 Lymphedema, not elsewhere classified: Secondary | ICD-10-CM

## 2016-05-19 LAB — URINE CULTURE

## 2016-05-19 NOTE — Therapy (Signed)
[place], [place], [address] Phone: [phone]   Fax:  [phone]  Physical Therapy Treatment  Patient Details  Name: [name] [name] MRN: [id number] Date of Birth: 04-16-1960 Referring Provider: Dr. Kyung Rudd  Encounter Date: 05/19/2016      PT End of Session - 05/19/16 1312    Visit Number 20  15 for lymph   Number of Visits 25  for lymph   Date for PT Re-Evaluation 06/25/16  lymph   Authorization - Number of Visits 140  for 2018   PT Start Time 1019   PT Stop Time 1102   PT Time Calculation (min) 43 min   Activity Tolerance Patient tolerated treatment well   Behavior During Therapy [facility] for tasks assessed/performed      Past Medical History:  Diagnosis Date  . Bone metastases ([place]) dx'd 05/2014  . Breast cancer ([place]) dx'd 2005/2011  . Peripheral vascular disease ([place]) 02/2010   blood clot related to porta cath  . PONV (postoperative nausea and vomiting)   . S/P radiation therapy 07/17/2014 through 08/02/2014    Left mediastinum, left seventh rib 3250 cGy in 13 sessions   . S/P radiation therapy 12/11/2014 through 12/22/2014    Left parietal calvarium 2400 cGy in 8 sessions   . Seizures ([place]) 2010   Isolated incident.    Past Surgical History:  Procedure Laterality Date  . AXILLARY LYMPH NODE DISSECTION  Dec. 2011  . BREAST LUMPECTOMY  2005  . MEDIASTINOTOMY CHAMBERLAIN MCNEIL Left 06/02/2013   Procedure: MEDIASTINOTOMY CHAMBERLAIN MCNEIL;  Surgeon: [name], MD;  Location: [facility];  Service: Thoracic;  Laterality: Left;  LEFT ANTERIOR MEDIASTINOTOMY   . PORTACATH PLACEMENT  12/11  . removal portacath      There were no vitals filed for this visit.      Subjective Assessment - 05/19/16 1019    Currently in Pain?  Yes   Pain Score 3    Pain Location Leg   Pain Orientation Right;Upper   Pain Score 6   Pain Score 6   Pain Location Axilla   Pain Orientation Right                         [facility] Adult PT Treatment/Exercise - 05/19/16 0001      Manual Therapy   Myofascial Release right axilla with focus on scar tightness   Manual Lymphatic Drainage (MLD) In left sidelying, posterior interaxillary anastomosis and right axillo-inguinal anastomosis; in supine, short neck, left axilla and anterior interaxillary anastomosis, right groin and axillo-inguinal anastomosis, and right upper extremity from fingers to shoulder.  In right sidelying, left periscapular area toward left groin; extra time spent on the latter today.   Other Manual Therapy soft tissue work at superior aspect of right medial and upper thigh in prone position for pain relief and muscle relaxation, gentle myofascial release at upper thigh, with releases today from far medial to lateral aspects of posterior thigh; extra time on pulling at upper thigh today                  PT Short Term Goals - 02/12/16 1226      PT SHORT TERM GOAL #1   Title pain with walking decreased >/= 25%   Time 4   Period Weeks   Status Achieved           PT Long Term Goals - 05/13/16 1319  PT LONG TERM GOAL #1   Title indpendent with HEP   Time 8   Period Weeks   Status On-going     PT LONG TERM GOAL #2   Title pain with walking decreased >/= 75%   Time 8   Period Weeks   Status On-going  60% after treatment     PT LONG TERM GOAL #5   Title reduction of pain by end of day >/= 50% due to increase tissue mobility   Time 8   Period Weeks   Status On-going  60% decreased after treatment but will go back           [facility] Goals - 05/02/16 1112      CC Long Term Goal  #2   Title Pt. will report swelling is adequately managed to enable ADL function at a consistent level.   Status On-going     CC Long  Term Goal  #4   Title Pain/discomfort at right axilla area will be controlled at 6/10 or less.   Status On-going     CC Long Term Goal  #5   Title Patient will avoid infection by ongoing management of her lymphedema at right breast/axilla/upper arm areas.   Status On-going     CC Long Term Goal  #6   Title Pain in right groin/ischial tuberosity area will be controlled at 3/10 level or less to enable walking with less pain.   Status On-going            Plan - 05/19/16 1313    Clinical Impression Statement Right groin area pain a little increased today; PET results seem to indicate this if from a fracture. Therapy continues to be helpful.  Pt. got PET scan results and is contemplating the next treatment to undergo.   Rehab Potential Good   Clinical Impairments Affecting Rehab Potential active cancer   PT Frequency 2x / week   PT Duration 12 weeks   PT Treatment/Interventions ADLs/Self Care Home Management;Therapeutic activities;Therapeutic exercise;Patient/family education;Manual techniques;Manual lymph drainage;Scar mobilization   PT Next Visit Plan soft tissue work, myofascial release, manual lymph drainage for cancer rehab   Consulted and Agree with Plan of Care Patient      Patient will benefit from skilled therapeutic intervention in order to improve the following deficits and impairments:  Increased edema, Increased fascial restricitons, Pain  Visit Diagnosis: Pain in right thigh  Lymphedema, not elsewhere classified     Problem List Patient Active Problem List   Diagnosis Date Noted  . Liver metastases ([place]) 02/23/2016  . 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 Zoster 04/04/2015  . Nausea with vomiting 11/18/2014  . Constipation 11/18/2014  . Left-sided thoracic back pain   . Bone metastases ([place]) 11/16/2014  . Back pain 11/15/2014  . Uncontrolled pain 11/14/2014  . Post-lymphadenectomy lymphedema of arm 05/31/2014  . Chest wall pain 03/21/2014  .  Abnormal LFTs (liver function tests) 09/12/2013  . Malignant neoplasm of upper-inner quadrant of right breast in female, estrogen receptor positive ([place]) 08/18/2013  . 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

## 2016-05-20 ENCOUNTER — Ambulatory Visit: Payer: 59 | Admitting: Physical Therapy

## 2016-05-20 ENCOUNTER — Encounter: Payer: Self-pay | Admitting: Physical Therapy

## 2016-05-20 DIAGNOSIS — M79651 Pain in right thigh: Secondary | ICD-10-CM

## 2016-05-20 DIAGNOSIS — M62838 Other muscle spasm: Secondary | ICD-10-CM

## 2016-05-20 NOTE — Therapy (Signed)
[facility] [address] [place], [place], [address] Phone: [phone]   Fax:  [phone]  Physical Therapy Treatment  Patient Details  Name: [name] [name] MRN: [id number] Date of Birth: 12/08/1960 Referring Provider: Dr. Kyung Rudd  Encounter Date: 05/20/2016      PT End of Session - 05/20/16 1301    Visit Number 21  pelvis   Date for PT Re-Evaluation 06/25/16  pelvis   PT Start Time 1221   PT Stop Time 1300   PT Time Calculation (min) 39 min   Activity Tolerance Patient tolerated treatment well   Behavior During Therapy [facility] for tasks assessed/performed      Past Medical History:  Diagnosis Date  . Bone metastases ([place]) dx'd 05/2014  . Breast cancer ([place]) dx'd 2005/2011  . Peripheral vascular disease ([place]) 02/2010   blood clot related to porta cath  . PONV (postoperative nausea and vomiting)   . S/P radiation therapy 07/17/2014 through 08/02/2014    Left mediastinum, left seventh rib 3250 cGy in 13 sessions   . S/P radiation therapy 12/11/2014 through 12/22/2014    Left parietal calvarium 2400 cGy in 8 sessions   . Seizures ([place]) 2010   Isolated incident.    Past Surgical History:  Procedure Laterality Date  . AXILLARY LYMPH NODE DISSECTION  Dec. 2011  . BREAST LUMPECTOMY  2005  . MEDIASTINOTOMY CHAMBERLAIN MCNEIL Left 06/02/2013   Procedure: MEDIASTINOTOMY CHAMBERLAIN MCNEIL;  Surgeon: [name], MD;  Location: [facility];  Service: Thoracic;  Laterality: Left;  LEFT ANTERIOR MEDIASTINOTOMY   . PORTACATH PLACEMENT  12/11  . removal portacath      There were no vitals filed for this visit.      Subjective Assessment - 05/20/16 1225    Subjective The issue is the fracture. the urine stream is not following right and the urine will  splash on me. I had a UTI and I am finishing up of the last medication.    How long can you walk comfortably? more she walks patient will have increased pain that stops her from walking further   Diagnostic tests x rays of back show no fractures;    Patient Stated Goals walk 20 minutes with minimal limp   Currently in Pain? Yes   Pain Score 3    Pain Location Leg   Pain Orientation Right;Upper   Pain Descriptors / Indicators Tightness   Pain Type Chronic pain   Pain Onset More than a month ago   Pain Frequency Constant   Aggravating Factors  walking   Pain Relieving Factors soft tissue work   Multiple Pain Sites No                         [facility] Adult PT Treatment/Exercise - 05/20/16 0001      Manual Therapy   Manual Therapy Soft tissue mobilization;Internal Pelvic Floor   Soft tissue mobilization right quad, right hip adductor, right hamstring   Internal Pelvic Floor bil. sides of urethra with left tighter than right. Gentle soft tissue work to post. introitus                  PT Short Term Goals - 02/12/16 1226      PT SHORT TERM GOAL #1   Title pain with walking decreased >/= 25%   Time 4   Period Weeks   Status Achieved  PT Long Term Goals - 05/20/16 1304      PT LONG TERM GOAL #1   Title indpendent with HEP   Time 8   Period Weeks   Status On-going     PT LONG TERM GOAL #2   Title pain with walking decreased >/= 75%   Time 8   Period Weeks   Status On-going     PT LONG TERM GOAL #3   Title ability to flex her right hip with right groin pain decreased >/= 50% due to improved tissue mobility   Time 8   Period Weeks   Status Achieved     PT LONG TERM GOAL #4   Title waking up in middle of night with pain decreased >/=50%   Time 8   Period Weeks   Status Achieved     PT LONG TERM GOAL #5   Title reduction of pain by end of day >/= 50% due to increase tissue mobility   Period Weeks   Status On-going            [facility] Goals - 05/02/16 1112      CC Long Term Goal  #2   Title Pt. will report swelling is adequately managed to enable ADL function at a consistent level.   Status On-going     CC Long Term Goal  #4   Title Pain/discomfort at right axilla area will be controlled at 6/10 or less.   Status On-going     CC Long Term Goal  #5   Title Patient will avoid infection by ongoing management of her lymphedema at right breast/axilla/upper arm areas.   Status On-going     CC Long Term Goal  #6   Title Pain in right groin/ischial tuberosity area will be controlled at 3/10 level or less to enable walking with less pain.   Status On-going            Plan - 05/20/16 1301    Clinical Impression Statement After therapy, urine stream improved by 50% and tightness in right thigh and groin decreased by 50%. Patient did not tolerate soft tissue work to right levator ani or obturator due to fracture has increased.  Patient will benefit from skilled therapy to improve function.    Rehab Potential Good   Clinical Impairments Affecting Rehab Potential active cancer   PT Frequency 2x / week   PT Duration 8 weeks   PT Treatment/Interventions ADLs/Self Care Home Management;Therapeutic activities;Therapeutic exercise;Patient/family education;Manual techniques;Manual lymph drainage;Scar mobilization   PT Next Visit Plan soft tissue work, myofascial release,    PT Home Exercise Plan progress as needed   Consulted and Agree with Plan of Care Patient      Patient will benefit from skilled therapeutic intervention in order to improve the following deficits and impairments:  Increased edema, Increased fascial restricitons, Pain  Visit Diagnosis: Pain in right thigh  Other muscle spasm     Problem List Patient Active Problem List   Diagnosis Date Noted  . Liver metastases ([place]) 02/23/2016  . 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 Zoster 04/04/2015  . Nausea with vomiting  11/18/2014  . Constipation 11/18/2014  . Left-sided thoracic back pain   . Bone metastases ([place]) 11/16/2014  . Back pain 11/15/2014  . Uncontrolled pain 11/14/2014  . Post-lymphadenectomy lymphedema of arm 05/31/2014  . Chest wall pain 03/21/2014  . Abnormal LFTs (liver function tests) 09/12/2013  . Malignant neoplasm of upper-inner quadrant of right breast  in female, estrogen receptor positive ([place]) 08/18/2013  . 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 [place], [place], [address] Phone: [phone]   Fax:  [phone]  Name: [name] [name] MRN: [id number] Date of Birth: 1960/04/20

## 2016-05-21 ENCOUNTER — Other Ambulatory Visit (HOSPITAL_COMMUNITY): Payer: Self-pay | Admitting: Interventional Radiology

## 2016-05-21 ENCOUNTER — Ambulatory Visit
Admission: RE | Admit: 2016-05-21 | Discharge: 2016-05-21 | Disposition: A | Discharge status: Home / Self Care | Payer: 59 | Source: Ambulatory Visit | Attending: Oncology | Admitting: Oncology

## 2016-05-21 ENCOUNTER — Encounter: Payer: Self-pay | Admitting: Radiology

## 2016-05-21 ENCOUNTER — Ambulatory Visit: Payer: 59

## 2016-05-21 DIAGNOSIS — C787 Secondary malignant neoplasm of liver and intrahepatic bile duct: Secondary | ICD-10-CM

## 2016-05-21 HISTORY — PX: IR GENERIC HISTORICAL: IMG1180011

## 2016-05-21 NOTE — Consult Note (Signed)
Chief Complaint: Patient was seen in consultation today for  Chief Complaint  Patient presents with  . Advice Only    Consult for Liver metastasis   at the request of Magrinat,Gustav C  Referring Physician(s): Magrinat,Gustav C  CURRENT TREATMENT: Fulvestrant, denosumab, palbociclib  History of Present Illness: [name] [name] is a 56 y.o. female with past medical history significant for metastatic breast cancer (initially diagnosed in 2005) who presents today to the interventional radiology clinic for evaluation of percutaneous treatment options for hepatic metastatic disease. Note, the patient is known to me as I placed a Pleurx catheter for her on 09/11/2013). She is accompanied by her husband though serves as her own historian.  The patient is asymptomatic in regards to her known slowly progressive hepatic disease.  The patient does state that nearly all previous systemic therapies have resulted in transient elevated of her liver function tests and for this reason, she is VERY concerned about undergoing any hepatic directed therapy.  Patient reports a palpable abnormality involving the left chest was otherwise without complaint. No change in energy level or appetite.  No unintentional weight loss. No chest pain or shortness of breath. No fever or chills.  Past Medical History:  Diagnosis Date  . Bone metastases ([place]) dx'd 05/2014  . Breast cancer ([place]) dx'd 2005/2011  . Peripheral vascular disease ([place]) 02/2010   blood clot related to porta cath  . PONV (postoperative nausea and vomiting)   . S/P radiation therapy 07/17/2014 through 08/02/2014    Left mediastinum, left seventh rib 3250 cGy in 13 sessions   . S/P radiation therapy 12/11/2014 through 12/22/2014    Left parietal calvarium 2400 cGy in 8 sessions   . Seizures  ([place]) 2010   Isolated incident.    Past Surgical History:  Procedure Laterality Date  . AXILLARY LYMPH NODE DISSECTION  Dec. 2011  . BREAST LUMPECTOMY  2005  . IR GENERIC HISTORICAL  05/21/2016   IR RADIOLOGIST EVAL & MGMT 05/21/2016 Sandi Mariscal, MD [facility] INTERV RAD  . MEDIASTINOTOMY CHAMBERLAIN MCNEIL Left 06/02/2013   Procedure: MEDIASTINOTOMY CHAMBERLAIN MCNEIL;  Surgeon: [name], MD;  Location: [place];  Service: Thoracic;  Laterality: Left;  LEFT ANTERIOR MEDIASTINOTOMY   . PORTACATH PLACEMENT  12/11  . removal portacath      Allergies: 2nd skin quick heal; Decadron [dexamethasone]; Dilaudid [hydromorphone]; Enoxaparin; Fluconazole; Hydromorphone hcl; Morphine and related; Protonix [pantoprazole sodium]; and Tegaderm ag mesh [silver]  Medications: Prior to Admission medications   Medication Sig Start Date End Date Taking? Authorizing Provider  ALPRAZolam Duanne Moron) 0.5 MG tablet Take 1 tablet (0.5 mg total) by mouth 2 (two) times daily as needed for anxiety. 03/03/16   Chauncey Cruel, MD  B Complex-C (B-COMPLEX WITH VITAMIN C) tablet Take 1 tablet by mouth daily. Reported on 03/27/2015 01/19/15   Chauncey Cruel, MD  calcium carbonate (TUMS - DOSED IN MG ELEMENTAL CALCIUM) 500 MG chewable tablet Chew 1 tablet by mouth as directed.    Historical Provider, MD  cholecalciferol 2000 UNITS tablet Take 1 tablet (2,000 Units total) by mouth daily. 01/19/15   Chauncey Cruel, MD  ciprofloxacin (CIPRO) 500 MG tablet Take 1 tablet (500 mg total) by mouth 2 (two) times daily. 05/16/16   Chauncey Cruel, MD  Diphenhyd-Hydrocort-Nystatin (FIRST-DUKES MOUTHWASH) SUSP 5-10 ml qid SWISH AND SPIT 05/09/15   Chauncey Cruel, MD  folic acid (FOLVITE) 1 MG tablet Take 1 tablet (1 mg total) by mouth daily. 01/19/15  Chauncey Cruel, MD  loratadine-pseudoephedrine (CLARITIN-D 24-HOUR) 10-240 MG 24 hr tablet Take 1 tablet by mouth daily.    Historical Provider, MD  Melatonin 3 MG TABS  Take 3 mg by mouth at bedtime.    Historical Provider, MD  methylPREDNISolone (MEDROL) 4 MG tablet Take 1 tablet (4mg ) x3 per day for 1 day, then 1 tablet x2 per day for 1 day, then 1 tablet per day for 1 day, then discontinue. 03/12/16   Kyung Rudd, MD  naproxen sodium (ANAPROX) 220 MG tablet Take 220 mg by mouth 2 (two) times daily with a meal.    Historical Provider, MD  palbociclib (IBRANCE) 100 MG capsule Take 1 capsule (100 mg total) by mouth daily with breakfast. Take whole with food. 02/29/16   Chauncey Cruel, MD  pentoxifylline (TRENTAL) 400 MG CR tablet Take 1 tablet (400 mg total) by mouth 2 (two) times daily. Take 400 units vitamin E twice a day with this medication. 05/16/16   Kyung Rudd, MD  phenazopyridine (PYRIDIUM) 100 MG tablet Take 1 tablet (100 mg total) by mouth 3 (three) times daily as needed for pain. 05/16/16   Chauncey Cruel, MD  phenazopyridine (PYRIDIUM) 200 MG tablet Take 1 tablet (200 mg total) by mouth 3 (three) times daily as needed for pain. 01/02/16   Kyung Rudd, MD  RABEprazole Sodium 5 MG CPSP Take 5 mg by mouth daily. 10/08/15   Chauncey Cruel, MD  saccharomyces boulardii (FLORASTOR) 250 MG capsule Take 250 mg by mouth daily.  03/23/15   Chauncey Cruel, MD  vitamin E (VITAMIN E) 400 UNIT capsule Take 1 capsule (400 Units total) by mouth 2 (two) times daily. 05/16/16   Kyung Rudd, MD     Family History  Problem Relation Age of Onset  . COPD Mother   . Breast cancer Sister 44    Social History   Social History  . Marital status: Married    Spouse name: N/A  . Number of children: N/A  . Years of education: N/A   Social History Main Topics  . Smoking status: Never Smoker  . Smokeless tobacco: Never Used  . Alcohol use No  . Drug use: No  . Sexual activity: Yes   Other Topics Concern  . Not on file   Social History Narrative  . No narrative on file    ECOG Status: 1 - Symptomatic but completely ambulatory  Review of Systems: A 12 point  ROS discussed and pertinent positives are indicated in the HPI above.  All other systems are negative.  Review of Systems  Constitutional: Negative.   Respiratory: Negative.   Cardiovascular: Negative.   Musculoskeletal:       Palpable abnormality involving the left side of the anterior chest.    Vital Signs: Ht 5\' 4"  (1.626 m)   Wt 160 lb (72.6 kg)   BMI 27.46 kg/m   Physical Exam  Constitutional: She appears well-developed and well-nourished.  HENT:  Head: Normocephalic and atraumatic.  Cardiovascular: Normal rate and intact distal pulses.   Easily palpable right common femoral dorsalis pedis arterial pulses.  Pulmonary/Chest: Effort normal and breath sounds normal.  Abdominal: Soft. Bowel sounds are normal.  Psychiatric: She has a normal mood and affect.     Imaging: Mr Pelvis W Wo Contrast  Addendum Date: 05/15/2016   ADDENDUM REPORT: 05/15/2016 09:04 ADDENDUM: This addendum is given for the purpose of comparing the current study to pelvic MRI and 12/12/2015. Right sacral lesion  series 21 image 9 measures 1.8 cm transverse compared to 1.4 cm. Left sacral lesion on the same image measures 1.5 cm compared to 1.4 cm. Right acetabular roof lesion on image 13 of series 21 measures 1.0 cm compared to 0.9 cm. Posterior right acetabular lesion on image 17 measures 0.8 cm compared to 0.6 cm. The lesion does not appear to enhance as avidly as on the prior exam. Lesion in the anterior right acetabulum measuring 1.9 cm transverse on image 17 of series 21 is not definitely seen on the prior exam. Right superior pubic ramus lesion on image 17 of series 121 21 measures 0.8 cm, unchanged. Abnormal marrow signal throughout the right inferior pubic ramus with enhancement is not notably changed. Overall, there has been mild progression of metastatic disease. Electronically Signed   By: Inge Rise M.D.   On: 05/15/2016 09:04   Result Date: 05/15/2016 CLINICAL DATA:  Patient with a history of  metastatic breast cancer. Pain about the pubic ramus. EXAM: MRI PELVIS WITHOUT AND WITH CONTRAST TECHNIQUE: Multiplanar multisequence MR imaging of the pelvis was performed both before and after administration of intravenous contrast. CONTRAST:  15 ml MULTIHANCE GADOBENATE DIMEGLUMINE 529 MG/ML IV SOLN COMPARISON:  PET CT scan 02/22/2016. FINDINGS: Abnormal signal and enhancement are seen throughout the right inferior pubic ramus consistent with metastatic disease. A small focus of abnormal signal and enhancement in the posterior right acetabulum and right superior pubic ramus are also consistent with metastatic disease. A larger deposit in the anterior right acetabulum measures 2.0 cm transverse by 1.7 cm AP on image 17 of series 21. Additional smaller metastatic deposits are seen scattered throughout the pelvis including the sacrum. Metastatic deposits are also identified in the L3 and L4 vertebral bodies, larger in L3 with the lesion measures 3.1 cm transverse by 1.6 cm craniocaudal. Minimal inferior endplate compression fracture anteriorly in L3 is identified. There is no fracture. No avascular necrosis of the femoral heads. Hips, sacroiliac joints and symphysis pubis appear normal. Musculature is intact. No evidence of bursitis. IMPRESSION: Multifocal osseous metastases throughout the pelvis, largest in the right inferior pubic ramus and anterior right acetabulum. Metastatic deposits in the L3 and L4 vertebral bodies are also identified. Very mild inferior endplate compression fracture of L3 is seen in the coronal plane only. Electronically Signed: By: Inge Rise M.D. On: 05/08/2016 14:47   Mr Abdomen W Wo Contrast  Result Date: 05/08/2016 CLINICAL DATA:  Metastatic breast cancer with known left pleural, hepatic, and abdominal nodal metastasis. EXAM: MRI ABDOMEN WITHOUT AND WITH CONTRAST TECHNIQUE: Multiplanar multisequence MR imaging of the abdomen was performed both before and after the  administration of intravenous contrast. CONTRAST:  17mL MULTIHANCE GADOBENATE DIMEGLUMINE 529 MG/ML IV SOLN COMPARISON:  PET of 02/22/2016 and abdominal MRI of 02/14/2016. FINDINGS: Lower chest: Normal heart size. Re- demonstrated is left-sided pleural metastasis. Rind of T2 hyperintense left pleural thickening medially measures 1.5 cm on image 18/series 11 versus 1.3 cm at the same level on the prior. More cephalad medial posterior enhancing left pleural nodule measures 1.5 cm on image 17/ series 17002 and is unchanged. More anterior left pleural based nodule measures 1.4 cm on image 18/ series 17002 and is enlarged since the prior. Hepatobiliary: Hepatic metastasis are best evaluated on series 17002. Index central right hepatic lobe lesion measures 2.7 x 2.6 cm on image 40 versus 2.7 x 2.2 cm on the prior. A more peripheral right sided mass measures 4.9 x 3.9 cm on image 44  today versus 4.5 x 3.8 cm on the prior. A pericholecystic right liver lobe lesion measures 2.9 x 2.1 cm on image 52 today versus 1.8 x 1.6 cm on the prior. Far inferior right hepatic lobe lesion measures 1.6 cm on image 70 today versus 1.2 cm on the prior. Cholelithiasis without acute cholecystitis or biliary duct dilatation. Pancreas:  Normal, without mass or ductal dilatation. Spleen:  Normal in size, without focal abnormality. Adrenals/Urinary Tract: Normal adrenal glands. Normal kidneys, without hydronephrosis. Stomach/Bowel: Normal stomach and abdominal bowel loops. Vascular/Lymphatic: Aortic atherosclerosis. A circumaortic left renal vein. Portacaval node measures 8 mm on image 26/series 11 compared to 7 mm on the prior. Not pathologic by size criteria. Left abdominal retroperitoneal node measures 11 x 9 mm on image 51/series 1202 and is not significantly changed. Other:  No ascites.  No evidence of omental or peritoneal disease. Musculoskeletal: Osseous metastasis again identified. A right-sided T11 lesion measures 1.5 cm on image  23/series 11 versus 1.9 cm on the prior. Right-sided lamina lesion at L1 measures 11 mm on image 36/ series 11 and is unchanged. Inferior L3 lesion measures 3.1 x 1.9 cm on image 28/series 18. Compare 3.3 x 1.7 cm on the prior, similar. IMPRESSION: 1. Since the prior MRI of 02/14/2016, progression of hepatic metastasis. 2. Similar to mild progression of left pleural metastasis, incompletely imaged. 3. Similar borderline to mild abdominal adenopathy. 4. Similar to mild improvement in osseous metastasis. 5. Cholelithiasis. Electronically Signed   By: Abigail Miyamoto M.D.   On: 05/08/2016 13:29   Nm Pet Image Restag (ps) Skull Base To Thigh  Result Date: 05/14/2016 CLINICAL DATA:  Subsequent treatment strategy for restaging of right-sided breast cancer with bone, liver, left pleural, and abdominal nodal metastasis. EXAM: NUCLEAR MEDICINE PET SKULL BASE TO THIGH TECHNIQUE: 8.0 mCi F-18 FDG was injected intravenously. Full-ring PET imaging was performed from the skull base to thigh after the radiotracer. CT data was obtained and used for attenuation correction and anatomic localization. FASTING BLOOD GLUCOSE:  Value: 98 mg/dl COMPARISON:  PET of 02/22/2016.  Abdominal MRI of 05/08/2016. FINDINGS: NECK No hypermetabolic nodes within the neck.  No cervical adenopathy. CHEST A new medial left subpectoral node measures 5 mm and a S.U.V. max of 5.1 on image 43/series 4. The previously described hypermetabolic more lateral left subpectoral node measures 4 mm and a S.U.V. max of 1.5 today versus 6 mm and a S.U.V. max of 2.0 on the prior exam. Index prevascular node measures 12 mm and a S.U.V. max of 8.8 today versus 11 mm and a S.U.V. max of 10.9 on the prior exam. Left internal mammary node measures 6 mm and a S.U.V. max of 5.2 on image 59/ series 4. Compare 8 mm and a S.U.V. max of 5.9 on the prior. Left-sided mediastinal hypermetabolism measures a S.U.V. max of 5.8 on approximately image 56/ series 4. Compare a S.U.V. max  of 6.5 on the prior. Index anterior left pleural lesion measures 13 mm and a S.U.V. max of 8.4 on image 77/series 4. Compare 12 mm and a S.U.V. max of 8.3 on the prior. Right axillary node dissection. Left-sided pleural thickening and trace fluid are similar. Minimal right upper lobe nodularity on image 10/series 8 is not readily apparent on the prior but favored to represent a subpleural lymph node. Left apical scarring and fibrosis with similar low-level hypermetabolism. Left lower lobe rounded atelectasis. ABDOMEN/PELVIS Dominant right hepatic lobe subcapsular mass measures 5.0 cm and a S.U.V. max of 11.4  on image 92/series 4. Compare 4.7 cm and a S.U.V. max of 13.1 on the prior exam. More central right hepatic lobe lesion measures 2.3 cm and a S.U.V. max of 7.6 on image 91/series 4. Compare 2.4 cm and a S.U.V. max of 7.5 on the prior. Portal caval node measures 9 mm and a S.U.V. max of 6.3 today versus 8 mm and a S.U.V. max of 6.3 on the prior. Left retroperitoneal hypermetabolism measures a S.U.V. max of 3.4 today versus a S.U.V. max of 3.5 on the prior. Aortic atherosclerosis. Circumaortic left renal vein. Mild pelvic floor laxity. SKELETON A new focus of hypermetabolism within the C4 vertebral body measures a S.U.V. max of 5.5. Hypermetabolism about the right side of the T11 vertebral body measures a S.U.V. max of 3.2 today versus a S.U.V. max of 6.2 on the prior exam. Hypermetabolism about the right inferior pubic ramus measures a S.U.V. max of 4.1 today versus a S.U.V. max of 4.3 on the prior. Subtle nondisplaced fracture in this region is more apparent today. Example image 174/series 4. IMPRESSION: 1. Since the prior PET of 02/22/2016, overall mixed response to therapy. 2. The majority of sites of disease within the chest, including nodal stations and the left pleural space, or similar to minimally improved. There is a new medial left subpectoral hypermetabolic node with resolution of hypermetabolism in a  more lateral left subpectoral node. 3. Similar disease within abdominal nodal stations and the liver. 4. Further improvement in hypermetabolism involving the T11 vertebral body. A new focus of hypermetabolism within C4 is suspicious for a new metastasis. 5. Right inferior pubic ramus hypermetabolism is similar. Nondisplaced pathologic fracture is more apparent today. Electronically Signed   By: Abigail Miyamoto M.D.   On: 05/14/2016 15:58   Ir Radiologist Eval & Mgmt  Result Date: 05/21/2016 Please refer to "Notes" to see consult details.   Labs:  CBC:  Recent Labs  04/28/16 1132 05/05/16 1123 05/12/16 1139 05/16/16 1021  WBC 2.7* 2.0* 2.1* 1.8*  HGB 11.4* 11.8 11.1* 10.7*  HCT 32.5* 33.6* 31.1* 29.7*  PLT 338 275 177 181    COAGS: No results for input(s): INR, APTT in the last 8760 hours.  BMP:  Recent Labs  02/11/16 1152 03/10/16 1143 04/07/16 1136 05/05/16 1127  NA 137 137 135* 137  K 3.9 4.4 4.0 4.2  CO2 26 26 26 27   GLUCOSE 85 89 81 89  BUN 10.2 10.5 10.8 8.7  CALCIUM 10.1 10.1 9.7 9.9  CREATININE 0.8 0.9 0.9 0.9    LIVER FUNCTION TESTS:  Recent Labs  02/11/16 1152 03/10/16 1143 04/07/16 1136 05/05/16 1127  BILITOT 0.38 0.40 0.35 0.38  AST 28 37* 29 32  ALT 29 44 32 38  ALKPHOS 74 87 79 80  PROT 7.5 7.5 7.3 7.5  ALBUMIN 3.8 3.9 4.2 4.2    TUMOR MARKERS: No results for input(s): AFPTM, CEA, CA199, CHROMGRNA in the last 8760 hours.  Assessment and Plan:  [name] [name] is a 56 y.o. female with past medical history significant for metastatic breast cancer (initially diagnosed in 2005) who presents today to the interventional radiology clinic for evaluation of percutaneous treatment options for hepatic metastatic disease.   The patient is asymptomatic in regards to her known slowly progressive hepatic disease.  The patient does state that nearly all previous systemic therapies have resulted in transient elevated of her liver function tests and for  this reason, she is VERY concerned about undergoing any hepatic directed therapy.  Selected images from PET/CT performed 05/14/2016 as well as abdominal MRI performed 05/08/2016 were reviewed in detail with the patient and the patient's husband.  I explained to the patient that given the extent of her disease seen both on preceding PET/CT as well as abdominal MRI, she is NOT a candidate for percutaneous microwave ablation as ablation is reserved for patients with less than 3 hepatic lesions with each lesion measuring , 3 cm in diameter.   Given the patient's normal liver function tests, I feel the patient was an excellent candidate for Y 90 radioembolization.  I explained that this procedure is not curative though is performed with the attempt to obtain disease stability to minimal improvement for the next 3-6 months.   I explained that if she elects to pursue Y-90 radioembolization, I would first obtain a planning CTA of the abdomen and pelvis to better delineate the hepatic arterial supply.  This would be followed by a Safety/Mapping arteriogram procedure with prophylactic coil embolization of vessels feeding nontarget organs as well as the administration of a test dose of radioactivity to confirm there is no nontarget embolization and to ensure there is not an unacceptably high lung shunt fraction, especially in the setting of her known left sided pleural disease.   I explained that as she has disease both within the right and left lobes of the liver, the actual Y-90 treatment would entail a bilobar treatment, initially treating the right lobe of the liver (as that is the segment of her liver which has more disease) followed by an approximate 4-6 week recovery period to ensure her LFTs normalize following the first radial embolization. If and when her liver function tests normalize, we will then proceed with radial embolization of the left lobe of the liver.  I explained that given her history of prior  XRT therapy, we would confirm she is not reached the threshold of lifetime dose of radiation exposure, however this procedure could potentially prevent performed again if her disease demonstrates an tinny progression on subsequent scans.  The patient and the patient's husband had multiple questions and concerns, all of which were addressed in detail.  At the present time, the patient is considering if she wants to undergo procedure however would like to begin the authorization process.   Additionally, based on her request, I will arrange for the patient to have a CTA of the abdomen and pelvis performed at [facility] either next Wednesday or Thursday following the placement of an IV in the interventional radiology suite (patient has history of poor central venous access and is concerned about having an IV placed).  Following the acquisition of this CTA, I will call the patient to discuss her ultimate decision in regards to proceeding with Y 90 radioembolization.  The patient was encouraged to call the interventional radiology clinic with any interval questions or concerns.  Thank you for this interesting consult.  I greatly enjoyed meeting ISHYA SALIH and look forward to participating in their care.  A copy of this report was sent to the requesting provider on this date.  Electronically Signed: Sandi Mariscal 05/21/2016, 12:28 PM   I spent a total of 60 Minutes in face to face in clinical consultation, greater than 50% of which was counseling/coordinating care for Metastatic breast cancer

## 2016-05-22 ENCOUNTER — Telehealth: Payer: Self-pay | Admitting: Radiology

## 2016-05-22 NOTE — Telephone Encounter (Signed)
Patient phoned earlier today w/ request for a call back from Dr Pascal Lux as she has additional questions.  Message sent to Dr Pascal Lux w/ her request.  He will try to contact her tomorrow morning if schedule allows.  Patient advised.  [name], [place] 05/22/2016 3:03 PM

## 2016-05-23 ENCOUNTER — Ambulatory Visit (HOSPITAL_BASED_OUTPATIENT_CLINIC_OR_DEPARTMENT_OTHER): Payer: 59 | Admitting: Oncology

## 2016-05-23 ENCOUNTER — Ambulatory Visit: Payer: 59 | Admitting: Physical Therapy

## 2016-05-23 ENCOUNTER — Other Ambulatory Visit (HOSPITAL_BASED_OUTPATIENT_CLINIC_OR_DEPARTMENT_OTHER): Payer: 59

## 2016-05-23 DIAGNOSIS — C782 Secondary malignant neoplasm of pleura: Secondary | ICD-10-CM | POA: Diagnosis not present

## 2016-05-23 DIAGNOSIS — C787 Secondary malignant neoplasm of liver and intrahepatic bile duct: Secondary | ICD-10-CM

## 2016-05-23 DIAGNOSIS — M79651 Pain in right thigh: Secondary | ICD-10-CM

## 2016-05-23 DIAGNOSIS — Z86718 Personal history of other venous thrombosis and embolism: Secondary | ICD-10-CM | POA: Diagnosis not present

## 2016-05-23 DIAGNOSIS — M8580 Other specified disorders of bone density and structure, unspecified site: Secondary | ICD-10-CM

## 2016-05-23 DIAGNOSIS — C50211 Malignant neoplasm of upper-inner quadrant of right female breast: Secondary | ICD-10-CM

## 2016-05-23 DIAGNOSIS — J90 Pleural effusion, not elsewhere classified: Secondary | ICD-10-CM | POA: Diagnosis not present

## 2016-05-23 DIAGNOSIS — C7951 Secondary malignant neoplasm of bone: Secondary | ICD-10-CM

## 2016-05-23 DIAGNOSIS — C778 Secondary and unspecified malignant neoplasm of lymph nodes of multiple regions: Secondary | ICD-10-CM

## 2016-05-23 DIAGNOSIS — I890 Lymphedema, not elsewhere classified: Secondary | ICD-10-CM

## 2016-05-23 DIAGNOSIS — C771 Secondary and unspecified malignant neoplasm of intrathoracic lymph nodes: Secondary | ICD-10-CM

## 2016-05-23 DIAGNOSIS — Z170 Estrogen receptor positive status [ER+]: Secondary | ICD-10-CM

## 2016-05-23 LAB — CBC WITH DIFFERENTIAL/PLATELET
BASO%: 3.3 % — ABNORMAL HIGH (ref 0.0–2.0)
BASOS ABS: 0.1 10*3/uL (ref 0.0–0.1)
EOS%: 1.2 % (ref 0.0–7.0)
Eosinophils Absolute: 0 10*3/uL (ref 0.0–0.5)
HCT: 32.7 % — ABNORMAL LOW (ref 34.8–46.6)
HEMOGLOBIN: 11.6 g/dL (ref 11.6–15.9)
LYMPH%: 15.2 % (ref 14.0–49.7)
MCH: 37.5 pg — AB (ref 25.1–34.0)
MCHC: 35.5 g/dL (ref 31.5–36.0)
MCV: 105.8 fL — AB (ref 79.5–101.0)
MONO#: 0.8 10*3/uL (ref 0.1–0.9)
MONO%: 23.6 % — ABNORMAL HIGH (ref 0.0–14.0)
NEUT#: 1.9 10*3/uL (ref 1.5–6.5)
NEUT%: 56.7 % (ref 38.4–76.8)
Platelets: 259 10*3/uL (ref 145–400)
RBC: 3.09 10*6/uL — AB (ref 3.70–5.45)
RDW: 14.4 % (ref 11.2–14.5)
WBC: 3.4 10*3/uL — ABNORMAL LOW (ref 3.9–10.3)
lymph#: 0.5 10*3/uL — ABNORMAL LOW (ref 0.9–3.3)
nRBC: 0 % (ref 0–0)

## 2016-05-23 MED ORDER — PALBOCICLIB 75 MG PO CAPS: 75.0000 mg | ORAL_CAPSULE | Freq: Every day | ORAL | 6 refills | Status: DC

## 2016-05-23 MED ORDER — EVEROLIMUS 2.5 MG PO TABS: 2.5000 mg | ORAL_TABLET | Freq: Every day | ORAL | 6 refills | Status: DC

## 2016-05-23 NOTE — Therapy (Signed)
[place], [place], [address] Phone: [phone]   Fax:  [phone]  Physical Therapy Treatment  Patient Details  Name: [name] [name] MRN: [id number] Date of Birth: 1960-06-25 Referring Provider: Dr. Kyung Rudd  Encounter Date: 05/23/2016      PT End of Session - 05/23/16 1120    Visit Number 50  53 for lymph   Number of Visits 25  for lymph   Date for PT Re-Evaluation 06/25/16   Authorization - Number of Visits 140   PT Start Time 1021   PT Stop Time 1107   PT Time Calculation (min) 46 min   Activity Tolerance Patient tolerated treatment well   Behavior During Therapy [facility] for tasks assessed/performed      Past Medical History:  Diagnosis Date  . Bone metastases ([place]) dx'd 05/2014  . Breast cancer ([place]) dx'd 2005/2011  . Peripheral vascular disease ([place]) 02/2010   blood clot related to porta cath  . PONV (postoperative nausea and vomiting)   . S/P radiation therapy 07/17/2014 through 08/02/2014    Left mediastinum, left seventh rib 3250 cGy in 13 sessions   . S/P radiation therapy 12/11/2014 through 12/22/2014    Left parietal calvarium 2400 cGy in 8 sessions   . Seizures ([place]) 2010   Isolated incident.    Past Surgical History:  Procedure Laterality Date  . AXILLARY LYMPH NODE DISSECTION  Dec. 2011  . BREAST LUMPECTOMY  2005  . IR GENERIC HISTORICAL  05/21/2016   IR RADIOLOGIST EVAL & MGMT 05/21/2016 Sandi Mariscal, MD [facility] INTERV RAD  . MEDIASTINOTOMY CHAMBERLAIN MCNEIL Left 06/02/2013   Procedure: MEDIASTINOTOMY CHAMBERLAIN MCNEIL;  Surgeon: [name], MD;  Location: [place];  Service: Thoracic;  Laterality: Left;  LEFT ANTERIOR MEDIASTINOTOMY   . PORTACATH PLACEMENT  12/11  . removal portacath      There were no vitals  filed for this visit.      Subjective Assessment - 05/23/16 1022    Subjective Met with the interventional radiologist and found out she has more liver lesions that she was told about previously.   Currently in Pain? Yes   Pain Score 3    Pain Location Leg   Pain Orientation Right;Upper   Pain Descriptors / Indicators Tightness   Pain Type Chronic pain   Aggravating Factors  walking   Pain Relieving Factors myofascial release   Pain Score 6  5-6   Pain Location Axilla   Pain Orientation Right   Pain Descriptors / Indicators Other (Comment)  full   Aggravating Factors  more swelling   Pain Relieving Factors  therapy                         [facility] Adult PT Treatment/Exercise - 05/23/16 0001      Manual Therapy   Myofascial Release right axilla with focus on scar tightness   Manual Lymphatic Drainage (MLD) In left sidelying, posterior interaxillary anastomosis and right axillo-inguinal anastomosis; in supine, short neck, left axilla and anterior interaxillary anastomosis, right groin and axillo-inguinal anastomosis, and right upper extremity from fingers to shoulder.  In right sidelying, left periscapular area toward left groin; extra time spent on the latter today.   Other Manual Therapy soft tissue work at superior aspect of right medial and upper thigh in prone position for pain relief and muscle relaxation, gentle myofascial release at upper thigh, with releases today from far medial to lateral  aspects of posterior thigh; extra time on pulling at upper thigh today                  PT Short Term Goals - 02/12/16 1226      PT SHORT TERM GOAL #1   Title pain with walking decreased >/= 25%   Time 4   Period Weeks   Status Achieved           PT Long Term Goals - 05/20/16 1304      PT LONG TERM GOAL #1   Title indpendent with HEP   Time 8   Period Weeks   Status On-going     PT LONG TERM GOAL #2   Title pain with walking decreased >/= 75%    Time 8   Period Weeks   Status On-going     PT LONG TERM GOAL #3   Title ability to flex her right hip with right groin pain decreased >/= 50% due to improved tissue mobility   Time 8   Period Weeks   Status Achieved     PT LONG TERM GOAL #4   Title waking up in middle of night with pain decreased >/=50%   Time 8   Period Weeks   Status Achieved     PT LONG TERM GOAL #5   Title reduction of pain by end of day >/= 50% due to increase tissue mobility   Period Weeks   Status On-going           Long Term Clinic Goals - 05/23/16 1123      CC Long Term Goal  #2   Title Pt. will report swelling is adequately managed to enable ADL function at a consistent level.   Status On-going     CC Long Term Goal  #4   Title Pain/discomfort at right axilla area will be controlled at 6/10 or less.   Status On-going     CC Long Term Goal  #5   Title Patient will avoid infection by ongoing management of her lymphedema at right breast/axilla/upper arm areas.   Status On-going     CC Long Term Goal  #6   Title Pain in right groin/ischial tuberosity area will be controlled at 3/10 level or less to enable walking with less pain.   Status On-going            Plan - 05/23/16 1121    Clinical Impression Statement Right upper outer breast tissue less indurated by palpation today, and patient with slightly less discomfort from this; right thigh pain continues to benefit from soft tissue and myofascial work, with patient routinely feeling less pain at end of session.   Rehab Potential Good   Clinical Impairments Affecting Rehab Potential active cancer   PT Frequency 2x / week   PT Duration 8 weeks   PT Treatment/Interventions ADLs/Self Care Home Management;Therapeutic activities;Therapeutic exercise;Patient/family education;Manual techniques;Manual lymph drainage;Scar mobilization   PT Next Visit Plan manual lymph drainage, soft tissue work, myofascial release,    PT Home Exercise Plan  progress as needed   Consulted and Agree with Plan of Care Patient      Patient will benefit from skilled therapeutic intervention in order to improve the following deficits and impairments:  Increased edema, Increased fascial restricitons, Pain  Visit Diagnosis: Pain in right thigh  Lymphedema, not elsewhere classified     Problem List Patient Active Problem List   Diagnosis Date Noted  . Liver metastases ([place]) 02/23/2016  .  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 [place] 04/04/2015  . Nausea with vomiting 11/18/2014  . Constipation 11/18/2014  . Left-sided thoracic back pain   . Bone metastases ([place]) 11/16/2014  . Back pain 11/15/2014  . Uncontrolled pain 11/14/2014  . Post-lymphadenectomy lymphedema of arm 05/31/2014  . Chest wall pain 03/21/2014  . Abnormal LFTs (liver function tests) 09/12/2013  . Malignant neoplasm of upper-inner quadrant of right breast in female, estrogen receptor positive ([place]) 08/18/2013  . 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

## 2016-05-23 NOTE — Progress Notes (Signed)
[name] [name]  Telephone:(336) [phone] Fax:(336) [phone]     ID: [name] [name] OB: November 29, 1960  MR#: [id number]  VFI#:[id number]  [name]: [name] [name] GYN:  [name] [name] SU:  OTHER [name] [name]: [name] [name], [name] [name], [name] [name], [name] [name], [name] [name], [name] [name]   CHIEF COMPLAINT: Stage IV breast cancer  CURRENT TREATMENT: Fulvestrant, denosumab, palbociclib, everolimus  INTERVAL HISTORY:   [name] [name] returns today with her husband [name] [name] to further discuss her choice of ongoing treatment given the fact that she had disease progression on the most recent scans. Since her last visit here she met with Dr. Pascal [name]. He extensively discussed the possibility of using yttrium to ablate the major liver lesions. The idea is that this would buy her time since the rest of her disease in the left lung and bones primarily is less immediately life-threatening.  She has been off palbociclib now for about 2 weeks. She is feeling a little bit better but is very distressed and anxious because of the choices ahead of her. She wonders if she has to rush into the atrium treatments because her liver is going to take her life in the next few weeks, or if she can try another round of systemic therapy and see if that controls the tumor.   REVIEW OF SYiSTEMS: [name] [name] is having a little bit of discomfort in the anterior upper medial chest, which is where she had an area of local progression that was irradiated previously. Her back feels heavy. She has some pain in the lower back and of course she has significant pain in the groin area because of her elbow fracture. Her urinary tract infection is better but has not completely cleared. She has a little bit of a dry cough. Aside from these issues a detailed review of systems today was stable  BREAST CANCER HISTORY: From doctor [name] [name]'s intake note 03/20/2004:  "The patient is a very pleasant 57 year old female, without  significant past medical history.  Her family history is significant for a sister who at age 49 was diagnosed with invasive ductal carcinoma.  She is a breast cancer survivor at age 3 now.  The patient states that she has never really had a screening mammogram until October 2005, when she felt that it was time for her to start having mammograms done on a yearly basis.  Therefore, on 01/26/04, she underwent a screening mammogram and an abnormality was detected in the upper outer right breast.  She, therefore, underwent spot compression views of both the right and the left breast.  The left breast revealed a well-defined mass in the upper outer left quadrant, present at the 2 o'clock position, measuring 1.8 cm, 6 cm from the nipple.  This, by ultrasound, was felt to be a simple cyst measuring 1.8 cm.  On the right breast, a spiculated mass was noted in the upper outer right quadrant.  The ultrasound revealed a shadowing irregular solid mass at the 10:30 position, 9 cm from the nipple, measuring 1.2 cm in greatest dimension, correlating with the spiculated mass seen on the mammogram.  The right axilla was negative ultrasonically.  Because of this, the patient underwent a needle biopsy of the right breast and the biopsy was positive invasive mammary carcinoma that showed features consistent with a high-grade invasive ductal carcinoma associated with desmoplastic stroma.  No in situ component was seen and no definite lymphovascular invasion was identified.  On the core biopsy, the tumor measured about 0.8  cm.  Because of this, she was seen by Dr. [name] and the patient was taken to the [name] on March 15, 2004.  She underwent a right breast lumpectomy with sentinel node biopsy.  The final pathology revealed an invasive ductal carcinoma, measuring 1.7 cm, grade 2 of 3.  Margins were free of tumor.  Atypical lobular hyperplasia was noted.  One sentinel node was removed which was negative for metastatic disease.   The tumor was staged at T1c, N0 MX.  It was estrogen receptor positive, progesterone receptor positive.  HER-2/neu was 2+.  FISH was negative.  All margins were free of tumor.  She is now seen in Medical Oncology for further evaluation and management of this newly diagnosed T1c, node negative, stage I, invasive ductal carcinoma of the right breast."  Her subsequent history is as detailed below   PAST MEDICAL HISTORY: Past Medical History:  Diagnosis Date  . Bone metastases ([name] [name]) dx'd 05/2014  . Breast cancer ([name] [name]) dx'd 2005/2011  . Peripheral vascular disease ([name] [name]) 02/2010   blood clot related to porta cath  . PONV (postoperative nausea and vomiting)   . S/P radiation therapy 07/17/2014 through 08/02/2014    Left mediastinum, left seventh rib 3250 cGy in 13 sessions   . S/P radiation therapy 12/11/2014 through 12/22/2014    Left parietal calvarium 2400 cGy in 8 sessions   . Seizures ([place]) 2010   Isolated incident.    PAST SURGICAL HISTORY: Past Surgical History:  Procedure Laterality Date  . AXILLARY LYMPH NODE DISSECTION  Dec. 2011  . BREAST LUMPECTOMY  2005  . IR GENERIC HISTORICAL  05/21/2016   IR RADIOLOGIST EVAL & MGMT 05/21/2016 [name] Mariscal, [name] [name] [facility] INTERV RAD  . MEDIASTINOTOMY CHAMBERLAIN MCNEIL Left 06/02/2013   Procedure: MEDIASTINOTOMY CHAMBERLAIN MCNEIL;  Surgeon: [name] Nakayama, [name] [name];  Location: [facility];  Service: Thoracic;  Laterality: Left;  LEFT ANTERIOR MEDIASTINOTOMY   . PORTACATH PLACEMENT  12/11  . removal portacath      FAMILY HISTORY Family History  Problem Relation Age of Onset  . COPD Mother   . Breast cancer Sister 16   The patient's father is living, 84 years old as of may 2015. He lives in [place]. The patient's mother died from complications of COPD at the age of 86. These has 2 brothers, one sister.  Her sister developed breast cancer at the age of 20. She is doing well. The patient herself underwent genetic testing at [facility] in 2011 and was found to be BRCA negative  GYNECOLOGIC HISTORY:  Menarche age 87, she is GX P0. She stopped having periods with her initial chemotherapy in 2006.  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 Her husband [name] [name] is a Medical illustrator in [place]. He has a child from a prior marriage. At home they have 2 rescue dogs, [place] and [place]. The patient is religious but not a church attender    ADVANCED DIRECTIVES: In place; at the 08/04/2014 visit in particular the patient was very clear, with her husband present, that she would not want any kind of feeding tubes or "other tubes" if her condition deteriorated.   HEALTH MAINTENANCE: Social History  Substance Use Topics  . Smoking status: Never Smoker  . Smokeless tobacco: Never Used  . Alcohol use No     Colonoscopy:  PAP:  Bone density: March 2015; mild osteopenia  Lipid panel:  Allergies  Allergen  Reactions  . 2nd Skin Quick Heal Other (See Comments)    Other Reaction: Skin peels  . Decadron [Dexamethasone] Other (See Comments)    Patient does not tolerate steroids.   . Dilaudid [Hydromorphone] Nausea And Vomiting  . Enoxaparin Other (See Comments)    unknown  . Fluconazole Swelling    Liver toxicity  . Hydromorphone Hcl Nausea And Vomiting  . Morphine And Related Nausea And Vomiting  . Protonix [Pantoprazole Sodium] Other (See Comments)    Patient reports it caused thrush.  . Tegaderm Ag Mesh [Silver]     Current Outpatient Prescriptions  Medication Sig Dispense Refill  . ALPRAZolam (XANAX) 0.5 MG tablet Take 1 tablet (0.5 mg total) by mouth 2 (two) times daily as needed for anxiety. 30 tablet 0  . B Complex-C (B-COMPLEX WITH VITAMIN C) tablet Take 1 tablet by mouth daily. Reported on 03/27/2015    . calcium carbonate  (TUMS - DOSED IN MG ELEMENTAL CALCIUM) 500 MG chewable tablet Chew 1 tablet by mouth as directed.    . cholecalciferol 2000 UNITS tablet Take 1 tablet (2,000 Units total) by mouth daily.    . ciprofloxacin (CIPRO) 500 MG tablet Take 1 tablet (500 mg total) by mouth 2 (two) times daily. 10 tablet 0  . Diphenhyd-Hydrocort-Nystatin (FIRST-DUKES MOUTHWASH) SUSP 5-10 ml qid SWISH AND SPIT 124 mL 3  . folic acid (FOLVITE) 1 MG tablet Take 1 tablet (1 mg total) by mouth daily.    Marland Kitchen loratadine-pseudoephedrine (CLARITIN-D 24-HOUR) 10-240 MG 24 hr tablet Take 1 tablet by mouth daily.    . Melatonin 3 MG TABS Take 3 mg by mouth at bedtime.    . methylPREDNISolone (MEDROL) 4 MG tablet Take 1 tablet (39m) x3 per day for 1 day, then 1 tablet x2 per day for 1 day, then 1 tablet per day for 1 day, then discontinue. 6 tablet 0  . naproxen sodium (ANAPROX) 220 MG tablet Take 220 mg by mouth 2 (two) times daily with a meal.    . palbociclib (IBRANCE) 100 MG capsule Take 1 capsule (100 mg total) by mouth daily with breakfast. Take whole with food. 21 capsule 6  . pentoxifylline (TRENTAL) 400 MG CR tablet Take 1 tablet (400 mg total) by mouth 2 (two) times daily. Take 400 units vitamin E twice a day with this medication. 60 tablet 3  . phenazopyridine (PYRIDIUM) 100 MG tablet Take 1 tablet (100 mg total) by mouth 3 (three) times daily as needed for pain. 30 tablet 0  . phenazopyridine (PYRIDIUM) 200 MG tablet Take 1 tablet (200 mg total) by mouth 3 (three) times daily as needed for pain. 30 tablet 1  . RABEprazole Sodium 5 MG CPSP Take 5 mg by mouth daily. 30 capsule 3  . saccharomyces boulardii (FLORASTOR) 250 MG capsule Take 250 mg by mouth daily.     . vitamin E (VITAMIN E) 400 UNIT capsule Take 1 capsule (400 Units total) by mouth 2 (two) times daily. 60 capsule 3   No current facility-administered medications for this visit.     OBJECTIVE: Middle-aged white woman In no acute distress  There were no vitals  filed for this visit.   There is no height or weight on file to calculate BMI.   Patient refused vitals 05/23/16    ECOG FS: 2 Exam was not repeated   LAB RESULTS:   CMP     Component Value Date/Time   NA 137 05/05/2016 1127   K 4.2 05/05/2016 1127  CL 103 12/14/2014 0800   CL 105 05/06/2012 1333   CO2 27 05/05/2016 1127   GLUCOSE 89 05/05/2016 1127   GLUCOSE 124 (H) 05/06/2012 1333   BUN 8.7 05/05/2016 1127   CREATININE 0.9 05/05/2016 1127   CALCIUM 9.9 05/05/2016 1127   PROT 7.5 05/05/2016 1127   ALBUMIN 4.2 05/05/2016 1127   AST 32 05/05/2016 1127   ALT 38 05/05/2016 1127   ALKPHOS 80 05/05/2016 1127   BILITOT 0.38 05/05/2016 1127   GFRNONAA >60 12/14/2014 0800   GFRAA >60 12/14/2014 0800    No results found for: SPEP  Lab Results  Component Value Date   WBC 3.4 (L) 05/23/2016   NEUTROABS 1.9 05/23/2016   HGB 11.6 05/23/2016   HCT 32.7 (L) 05/23/2016   MCV 105.8 (H) 05/23/2016   PLT 259 05/23/2016      Chemistry      Component Value Date/Time   NA 137 05/05/2016 1127   K 4.2 05/05/2016 1127   CL 103 12/14/2014 0800   CL 105 05/06/2012 1333   CO2 27 05/05/2016 1127   BUN 8.7 05/05/2016 1127   CREATININE 0.9 05/05/2016 1127      Component Value Date/Time   CALCIUM 9.9 05/05/2016 1127   ALKPHOS 80 05/05/2016 1127   AST 32 05/05/2016 1127   ALT 38 05/05/2016 1127   BILITOT 0.38 05/05/2016 1127     No results for input(s): INR in the last 168 hours.  Urinalysis    Component Value Date/Time   COLORURINE YELLOW 11/17/2014 0143   APPEARANCEUR CLOUDY (A) 11/17/2014 0143   LABSPEC 1.005 05/16/2016 1201   PHURINE 6.5 05/16/2016 1201   PHURINE 6.5 11/17/2014 0143   GLUCOSEU Negative 05/16/2016 1201   HGBUR Negative 05/16/2016 1201   HGBUR NEGATIVE 11/17/2014 0143   BILIRUBINUR Negative 05/16/2016 1201   KETONESUR Negative 05/16/2016 1201   KETONESUR NEGATIVE 11/17/2014 0143   PROTEINUR Negative 05/16/2016 1201   PROTEINUR NEGATIVE 11/17/2014  0143   UROBILINOGEN 0.2 05/16/2016 1201   NITRITE Negative 05/16/2016 1201   NITRITE NEGATIVE 11/17/2014 0143   LEUKOCYTESUR Small 05/16/2016 1201    Ref Range & Units 9d ago 51moago 275mogo    CA 27.29 0.0 - 38.6 U/mL 2,610.0   2,652.0CM   2,463.9CM    Comments: Specimen      STUDIES: Mr Pelvis W Wo Contrast  Addendum Date: 05/15/2016   ADDENDUM REPORT: 05/15/2016 09:04 ADDENDUM: This addendum is given for the purpose of comparing the current study to pelvic MRI and 12/12/2015. Right sacral lesion series 21 image 9 measures 1.8 cm transverse compared to 1.4 cm. Left sacral lesion on the same image measures 1.5 cm compared to 1.4 cm. Right acetabular roof lesion on image 13 of series 21 measures 1.0 cm compared to 0.9 cm. Posterior right acetabular lesion on image 17 measures 0.8 cm compared to 0.6 cm. The lesion does not appear to enhance as avidly as on the prior exam. Lesion in the anterior right acetabulum measuring 1.9 cm transverse on image 17 of series 21 is not definitely seen on the prior exam. Right superior pubic ramus lesion on image 17 of series 121 21 measures 0.8 cm, unchanged. Abnormal marrow signal throughout the right inferior pubic ramus with enhancement is not notably changed. Overall, there has been mild progression of metastatic disease. Electronically Signed   By: ThInge Rise.D.   On: 05/15/2016 09:04   Result Date: 05/15/2016 CLINICAL DATA:  Patient with a history  of metastatic breast cancer. Pain about the pubic ramus. EXAM: MRI PELVIS WITHOUT AND WITH CONTRAST TECHNIQUE: Multiplanar multisequence MR imaging of the pelvis was performed both before and after administration of intravenous contrast. CONTRAST:  15 ml MULTIHANCE GADOBENATE DIMEGLUMINE 529 MG/ML IV SOLN COMPARISON:  PET CT scan 02/22/2016. FINDINGS: Abnormal signal and enhancement are seen throughout the right inferior pubic ramus consistent with metastatic disease. A small focus of abnormal signal and  enhancement in the posterior right acetabulum and right superior pubic ramus are also consistent with metastatic disease. A larger deposit in the anterior right acetabulum measures 2.0 cm transverse by 1.7 cm AP on image 17 of series 21. Additional smaller metastatic deposits are seen scattered throughout the pelvis including the sacrum. Metastatic deposits are also identified in the L3 and L4 vertebral bodies, larger in L3 with the lesion measures 3.1 cm transverse by 1.6 cm craniocaudal. Minimal inferior endplate compression fracture anteriorly in L3 is identified. There is no fracture. No avascular necrosis of the femoral heads. Hips, sacroiliac joints and symphysis pubis appear normal. Musculature is intact. No evidence of bursitis. IMPRESSION: Multifocal osseous metastases throughout the pelvis, largest in the right inferior pubic ramus and anterior right acetabulum. Metastatic deposits in the L3 and L4 vertebral bodies are also identified. Very mild inferior endplate compression fracture of L3 is seen in the coronal plane only. Electronically Signed: By: Inge Rise M.D. On: 05/08/2016 14:47   Mr Abdomen W Wo Contrast  Result Date: 05/08/2016 CLINICAL DATA:  Metastatic breast cancer with known left pleural, hepatic, and abdominal nodal metastasis. EXAM: MRI ABDOMEN WITHOUT AND WITH CONTRAST TECHNIQUE: Multiplanar multisequence MR imaging of the abdomen was performed both before and after the administration of intravenous contrast. CONTRAST:  33m MULTIHANCE GADOBENATE DIMEGLUMINE 529 MG/ML IV SOLN COMPARISON:  PET of 02/22/2016 and abdominal MRI of 02/14/2016. FINDINGS: Lower chest: Normal heart size. Re- demonstrated is left-sided pleural metastasis. Rind of T2 hyperintense left pleural thickening medially measures 1.5 cm on image 18/series 11 versus 1.3 cm at the same level on the prior. More cephalad medial posterior enhancing left pleural nodule measures 1.5 cm on image 17/ series 17002 and is  unchanged. More anterior left pleural based nodule measures 1.4 cm on image 18/ series 17002 and is enlarged since the prior. Hepatobiliary: Hepatic metastasis are best evaluated on series 17002. Index central right hepatic lobe lesion measures 2.7 x 2.6 cm on image 40 versus 2.7 x 2.2 cm on the prior. A more peripheral right sided mass measures 4.9 x 3.9 cm on image 44 today versus 4.5 x 3.8 cm on the prior. A pericholecystic right liver lobe lesion measures 2.9 x 2.1 cm on image 52 today versus 1.8 x 1.6 cm on the prior. Far inferior right hepatic lobe lesion measures 1.6 cm on image 70 today versus 1.2 cm on the prior. Cholelithiasis without acute cholecystitis or biliary duct dilatation. Pancreas:  Normal, without mass or ductal dilatation. Spleen:  Normal in size, without focal abnormality. Adrenals/Urinary Tract: Normal adrenal glands. Normal kidneys, without hydronephrosis. Stomach/Bowel: Normal stomach and abdominal bowel loops. Vascular/Lymphatic: Aortic atherosclerosis. A circumaortic left renal vein. Portacaval node measures 8 mm on image 26/series 11 compared to 7 mm on the prior. Not pathologic by size criteria. Left abdominal retroperitoneal node measures 11 x 9 mm on image 51/series 1202 and is not significantly changed. Other:  No ascites.  No evidence of omental or peritoneal disease. Musculoskeletal: Osseous metastasis again identified. A right-sided T11 lesion measures 1.5  cm on image 23/series 11 versus 1.9 cm on the prior. Right-sided lamina lesion at L1 measures 11 mm on image 36/ series 11 and is unchanged. Inferior L3 lesion measures 3.1 x 1.9 cm on image 28/series 18. Compare 3.3 x 1.7 cm on the prior, similar. IMPRESSION: 1. Since the prior MRI of 02/14/2016, progression of hepatic metastasis. 2. Similar to mild progression of left pleural metastasis, incompletely imaged. 3. Similar borderline to mild abdominal adenopathy. 4. Similar to mild improvement in osseous metastasis. 5.  Cholelithiasis. Electronically Signed   By: Abigail Miyamoto M.D.   On: 05/08/2016 13:29   Nm Pet Image Restag (ps) Skull Base To Thigh  Result Date: 05/14/2016 CLINICAL DATA:  Subsequent treatment strategy for restaging of right-sided breast cancer with bone, liver, left pleural, and abdominal nodal metastasis. EXAM: NUCLEAR MEDICINE PET SKULL BASE TO THIGH TECHNIQUE: 8.0 mCi F-18 FDG was injected intravenously. Full-ring PET imaging was performed from the skull base to thigh after the radiotracer. CT data was obtained and used for attenuation correction and anatomic localization. FASTING BLOOD GLUCOSE:  Value: 98 mg/dl COMPARISON:  PET of 02/22/2016.  Abdominal MRI of 05/08/2016. FINDINGS: NECK No hypermetabolic nodes within the neck.  No cervical adenopathy. CHEST A new medial left subpectoral node measures 5 mm and a S.U.V. max of 5.1 on image 43/series 4. The previously described hypermetabolic more lateral left subpectoral node measures 4 mm and a S.U.V. max of 1.5 today versus 6 mm and a S.U.V. max of 2.0 on the prior exam. Index prevascular node measures 12 mm and a S.U.V. max of 8.8 today versus 11 mm and a S.U.V. max of 10.9 on the prior exam. Left internal mammary node measures 6 mm and a S.U.V. max of 5.2 on image 59/ series 4. Compare 8 mm and a S.U.V. max of 5.9 on the prior. Left-sided mediastinal hypermetabolism measures a S.U.V. max of 5.8 on approximately image 56/ series 4. Compare a S.U.V. max of 6.5 on the prior. Index anterior left pleural lesion measures 13 mm and a S.U.V. max of 8.4 on image 77/series 4. Compare 12 mm and a S.U.V. max of 8.3 on the prior. Right axillary node dissection. Left-sided pleural thickening and trace fluid are similar. Minimal right upper lobe nodularity on image 10/series 8 is not readily apparent on the prior but favored to represent a subpleural lymph node. Left apical scarring and fibrosis with similar low-level hypermetabolism. Left lower lobe rounded  atelectasis. ABDOMEN/PELVIS Dominant right hepatic lobe subcapsular mass measures 5.0 cm and a S.U.V. max of 11.4 on image 92/series 4. Compare 4.7 cm and a S.U.V. max of 13.1 on the prior exam. More central right hepatic lobe lesion measures 2.3 cm and a S.U.V. max of 7.6 on image 91/series 4. Compare 2.4 cm and a S.U.V. max of 7.5 on the prior. Portal caval node measures 9 mm and a S.U.V. max of 6.3 today versus 8 mm and a S.U.V. max of 6.3 on the prior. Left retroperitoneal hypermetabolism measures a S.U.V. max of 3.4 today versus a S.U.V. max of 3.5 on the prior. Aortic atherosclerosis. Circumaortic left renal vein. Mild pelvic floor laxity. SKELETON A new focus of hypermetabolism within the C4 vertebral body measures a S.U.V. max of 5.5. Hypermetabolism about the right side of the T11 vertebral body measures a S.U.V. max of 3.2 today versus a S.U.V. max of 6.2 on the prior exam. Hypermetabolism about the right inferior pubic ramus measures a S.U.V. max of 4.1 today versus  a S.U.V. max of 4.3 on the prior. Subtle nondisplaced fracture in this region is more apparent today. Example image 174/series 4. IMPRESSION: 1. Since the prior PET of 02/22/2016, overall mixed response to therapy. 2. The majority of sites of disease within the chest, including nodal stations and the left pleural space, or similar to minimally improved. There is a new medial left subpectoral hypermetabolic node with resolution of hypermetabolism in a more lateral left subpectoral node. 3. Similar disease within abdominal nodal stations and the liver. 4. Further improvement in hypermetabolism involving the T11 vertebral body. A new focus of hypermetabolism within C4 is suspicious for a new metastasis. 5. Right inferior pubic ramus hypermetabolism is similar. Nondisplaced pathologic fracture is more apparent today. Electronically Signed   By: Abigail Miyamoto M.D.   On: 05/14/2016 15:58   Ir Radiologist Eval & Mgmt  Result Date:  05/21/2016 Please refer to "Notes" to see consult details.   ASSESSMENT: 56 y.o. BRCA negative [place] woman with stage IV breast cancer, history as follows  (1)  S/p Right upper inner quadrant lumpectomy and sentinel lymph node sampling 03/15/2004 for a pT1c pN0. Stage IA invasive ductal carcinoma, grade 2, estrogen receptor 95% positive, progesterone receptor 65% positive, HER-2 not amplified; additional surgery 04/25/2004 for seroma or clearance showed no residual tumor  (2) adjuvant chemotherapy with cyclophosphamide and doxorubicin every 21 days x4 completed 07/19/2004  (3) adjuvant radiation given under Dr. Donella Stade in [place] completed July 2006  (4) the patient opted against adjuvant antiestrogen therapy  (5) genetics testing showed no BRCA mutations  (6) biopsy of a palpable right axillary mass 10/24/2009 showed invasive ductal carcinoma, grade 3, estrogen receptor 100% positive, progesterone receptor 2% positive (alert score 5) HER-2 negative; no evidence of systemic disease on PET scanning  (7) completed 3 of 4 planned cycles of docetaxel and cyclophosphamide September 2011, fourth cycle omitted because of marked elevations in liver function tests  (8) an right axillary lymph node dissection 03/06/2010 showed 3/8 lymph nodes removed to be involved by tumor, with extracapsular extension.  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, TTS-1 negative), estrogen receptor 80% positive, progesterone receptor 2% positive, HER-2 not amplified  (12) letrozole started March 2015-- tolerated  with significant side effects, discontinued at the end of May 2015  (13) PET scan 08/16/2013 shows extensive left pleural metastatic disease and a large left pleural effusion that shifts cardiac and mediastinal structures to the right; adenopathy (celiac trunk, periadrenal, periaortic); and a left medial clavicular lesion; Status post left thoracentesis 08/16/2013 positive for adenocarcinoma, estrogen receptor positive, progesterone receptor negative.  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; everolimus was suggested as an available targeted agent  (18) exemestane started 03/31/2014, discontinued 10/31/2014 with evidence of progression  (a) everolimus added 04/03/2014  but not tolerated (cytopenias, elevated LFTs) even at minimal doses; stopped 04/17/2014  (19) fulvestrant started 12/20/2014  (a) palbociclib added at very low dose 04/03/2015 (starting dose 75 mg weekly)  (b) palbociclib dose gradually increased to 75 mg daily, 21/7, as of May 2017  (c)  palbociclib dose increased to 100 mg daily, 21/ 7, beginning November mid- cycle  (20) liver biopsy 03/20/2015 confirms metastatic carcinoma, still estrogen receptor positive at 100%, progesterone receptor negative, HER-2 equivocal with a signals ratio 1.41, number per cell 4.50.   (a) repeat liver biopsy December 2017 might show further changes (HER-2 positivity)  (21) immunohistochemistry for mismatched repair protein mutations 03/20/2015 showed normal major and minor MMR  proteins, with a very low probability of microsatellite instability ([id number])  (22) adjuvant radiation 12/24/15-01/02/16 Site/dose:   1) Left T9 Rib / 24 Gy in 8 fx                         2) Right inferior pelvis/ 24 Gy in 8 fx  (23) consider pembrolizumab (obtained compassionate release from company)    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 treated with Lovenox for 5-6 months  (c) right upper extremity lymphedema--receiving physical therapy  (d) hepatic steatosis with chronically elevated LFTs as well as unusual hepatic sensitivity to chemotherapy  (e) osteopenia with the lowest T score -1.6 on bone density scan 06/20/2013  (i) on denosumab/ Xgeva Q28d  (f) radiation oncology (Dr Valere Dross) has reviewed prior radiation records in case there is further mediastinal involvement with dysphagia etc in which case palliative XRT could be considered  (a) radiation to left mediastinum/ left 7th rib 3250 cGy in 13 sessions04/18/2016 through 08/02/2014  (b) radiation to T11 area: 22 Gy in 7 sessions, last dose 11/27/2014  (c) radiation left parietal scalp region to be completed 12/22/2014  (d) radiation to sacral area completed 04/09/2015  (e) radiation to right inferior pelvis and left ninth rib (24 gray, 12/24/2015--01/02/2016)  (f) T11 was treated stereotactically with 14 gray in 1 fraction 03/12/2016.  (g) chest wall and perineal pain--improved post radiation treatments  (a) discussed celebrex/ carafate but holding off for now  (h) zoster diagnosed 04/04/2015-- on valacyclovir   PLAN: I spent approximately an hour and a half with [name] and [name] today going over her choices. The big choices whether to proceed to the yttrium-90 treatments now or to try another round of systemic therapy first.  If we tried systemic therapy, that best chance I think would be to add  everolimus to her denosumab/Xgeva plus fulvestrant plus palbociclib. We considered changing toribociclib because of her dropping counts, but she tolerated palbociclib at 75 mg daily well and we would go back to that.  The question is what to add to the systemic treatment. If we just continue doing what we were doing there would be further disease progression. There is 3 choices. She could try the pembrolizumab, although her molecular tests came back negative for PD L1. She could try letrozole, even though that irritated her liver in the past. My recommendation however is that she try everolimus, which according to her Foundation one study is the drug that has been most chance of being effective in her cancer.  She is terrified of the everolimus because of concerns regarding mouth sores, diarrhea, rash, and irritation of the liver. She was not able  to tolerate more than approximately 1 week of treatment at full dose.  Accordingly if we do the everolimus she was started 2.5 mg the first week, namely 1 pill per week, and then if she tolerates that well the next week she will take a pill on Monday and Thursday and then the third week go to Monday Wednesday Friday. We would see if we could take at least an every other day dose in this fashion. After 8 or 9 weeks on treatment she would be restaged.   At this point and with many caveats she is thinking that that is the way she may go. We are waiting until Monday to give her decision time to become a little firmer, but if she still thinks the same on Monday we will let Dr. Pascal [name] know and the scan she has already scheduled for next week will be postponed until we restage her in 2 months or until she is unable to tolerate even very low doses of everolimus.  In short if she does not tolerate the everolimus plus her current treatment or if the combination does not work then she tells me she would proceed to the yttrium-90 at that point  It is very difficult to make  choices in this setting because the ultimate effects cannot be predicted with certainty. If she waits for example she might lose the opportunity of doing the yttrium-90 treatment. On the other hand she might have a very good response to systemic therapy and that might be all she needs to do for some months and then do the yttrium at some point in the future.  Ultimately she is choosing side effects over quality of life at this point and therefore she definitely wants more treatment.  Her next dose of fulvestrant and is due for March 6. I have put in the order for this 75 mg palbociclib and the 2.5 mg everolimus and she should be getting that next week  She knows to call for any problems that may develop he for her return visit here.     Chauncey Cruel, [name] [name]   05/23/2016 3:49 PM

## 2016-05-26 ENCOUNTER — Ambulatory Visit: Payer: 59 | Admitting: Physical Therapy

## 2016-05-26 ENCOUNTER — Other Ambulatory Visit: Payer: 59

## 2016-05-26 ENCOUNTER — Other Ambulatory Visit: Payer: Self-pay | Admitting: Oncology

## 2016-05-26 DIAGNOSIS — M79651 Pain in right thigh: Secondary | ICD-10-CM | POA: Diagnosis not present

## 2016-05-26 DIAGNOSIS — C50211 Malignant neoplasm of upper-inner quadrant of right female breast: Secondary | ICD-10-CM

## 2016-05-26 DIAGNOSIS — Z170 Estrogen receptor positive status [ER+]: Principal | ICD-10-CM

## 2016-05-26 DIAGNOSIS — I890 Lymphedema, not elsewhere classified: Secondary | ICD-10-CM

## 2016-05-26 MED ORDER — CIPROFLOXACIN HCL 500 MG PO TABS: 500.0000 mg | ORAL_TABLET | Freq: Two times a day (BID) | ORAL | 0 refills | Status: DC

## 2016-05-26 MED ORDER — LETROZOLE 2.5 MG PO TABS: 2.5000 mg | ORAL_TABLET | Freq: Every day | ORAL | 3 refills | Status: DC

## 2016-05-26 NOTE — Therapy (Signed)
[place], [place], [address] Phone: [phone]   Fax:  [phone]  Physical Therapy Treatment  Patient Details  Name: [name] [name] MRN: [id number] Date of Birth: 1960-07-09 Referring Provider: Dr. Kyung Rudd  Encounter Date: 05/26/2016      PT End of Session - 05/26/16 1243    Visit Number 48  17 for lymph   Number of Visits 25  for lymph   Date for PT Re-Evaluation 06/25/16   Authorization - Number of Visits 140  for 2018   PT Start Time 1019   PT Stop Time 1103   PT Time Calculation (min) 44 min   Activity Tolerance Patient tolerated treatment well   Behavior During Therapy [facility] for tasks assessed/performed      Past Medical History:  Diagnosis Date  . Bone metastases ([place]) dx'd 05/2014  . Breast cancer ([place]) dx'd 2005/2011  . Peripheral vascular disease ([place]) 02/2010   blood clot related to porta cath  . PONV (postoperative nausea and vomiting)   . S/P radiation therapy 07/17/2014 through 08/02/2014    Left mediastinum, left seventh rib 3250 cGy in 13 sessions   . S/P radiation therapy 12/11/2014 through 12/22/2014    Left parietal calvarium 2400 cGy in 8 sessions   . Seizures ([place]) 2010   Isolated incident.    Past Surgical History:  Procedure Laterality Date  . AXILLARY LYMPH NODE DISSECTION  Dec. 2011  . BREAST LUMPECTOMY  2005  . IR GENERIC HISTORICAL  05/21/2016   IR RADIOLOGIST EVAL & MGMT 05/21/2016 Sandi Mariscal, MD [facility] INTERV RAD  . MEDIASTINOTOMY CHAMBERLAIN MCNEIL Left 06/02/2013   Procedure: MEDIASTINOTOMY CHAMBERLAIN MCNEIL;  Surgeon: [name], MD;  Location: [place];  Service: Thoracic;  Laterality: Left;  LEFT ANTERIOR MEDIASTINOTOMY   . PORTACATH PLACEMENT  12/11  . removal portacath      There were  no vitals filed for this visit.      Subjective Assessment - 05/26/16 1020    Subjective "I haven't been walking."  Pt. goes on to describe her meeting three days ago with Dr. Jana Hakim, and decision-making about what treatment course to pursue next to deal with multiple liver metastases.   Currently in Pain? Yes   Pain Score 2    Pain Location Leg   Pain Orientation Right;Upper   Aggravating Factors  walking   Pain Relieving Factors not walking, myofascial release   Pain Score 6   Pain Location Axilla   Pain Orientation Right                         [facility] Adult PT Treatment/Exercise - 05/26/16 0001      Manual Therapy   Myofascial Release right axilla with focus on scar tightness   Manual Lymphatic Drainage (MLD) In left sidelying, posterior interaxillary anastomosis and right axillo-inguinal anastomosis; in supine, short neck, left axilla and anterior interaxillary anastomosis, right groin and axillo-inguinal anastomosis, and right upper extremity from fingers to shoulder.  In right sidelying, left periscapular area toward left groin; extra time spent on the latter today.   Other Manual Therapy soft tissue work at superior aspect of right medial and upper thigh in prone position for pain relief and muscle relaxation, gentle myofascial release at upper thigh, with releases today from far medial to lateral aspects of posterior thigh; extra time on pulling at upper thigh today  PT Short Term Goals - 02/12/16 1226      PT SHORT TERM GOAL #1   Title pain with walking decreased >/= 25%   Time 4   Period Weeks   Status Achieved           PT Long Term Goals - 05/20/16 1304      PT LONG TERM GOAL #1   Title indpendent with HEP   Time 8   Period Weeks   Status On-going     PT LONG TERM GOAL #2   Title pain with walking decreased >/= 75%   Time 8   Period Weeks   Status On-going     PT LONG TERM GOAL #3   Title ability to flex her  right hip with right groin pain decreased >/= 50% due to improved tissue mobility   Time 8   Period Weeks   Status Achieved     PT LONG TERM GOAL #4   Title waking up in middle of night with pain decreased >/=50%   Time 8   Period Weeks   Status Achieved     PT LONG TERM GOAL #5   Title reduction of pain by end of day >/= 50% due to increase tissue mobility   Period Weeks   Status On-going           Long Term Clinic Goals - 05/23/16 1123      CC Long Term Goal  #2   Title Pt. will report swelling is adequately managed to enable ADL function at a consistent level.   Status On-going     CC Long Term Goal  #4   Title Pain/discomfort at right axilla area will be controlled at 6/10 or less.   Status On-going     CC Long Term Goal  #5   Title Patient will avoid infection by ongoing management of her lymphedema at right breast/axilla/upper arm areas.   Status On-going     CC Long Term Goal  #6   Title Pain in right groin/ischial tuberosity area will be controlled at 3/10 level or less to enable walking with less pain.   Status On-going            Plan - 05/26/16 1244    Clinical Impression Statement Lymphedema seems mild today in hand, and softer than usual at upper outer breast.  Continues to benefit from therapy for symptome relief.   Rehab Potential Good   Clinical Impairments Affecting Rehab Potential active cancer   PT Frequency 2x / week   PT Duration 8 weeks   PT Treatment/Interventions ADLs/Self Care Home Management;Therapeutic activities;Therapeutic exercise;Patient/family education;Manual techniques;Manual lymph drainage;Scar mobilization   PT Next Visit Plan manual lymph drainage, soft tissue work, myofascial release,    PT Home Exercise Plan progress as needed   Consulted and Agree with Plan of Care Patient      Patient will benefit from skilled therapeutic intervention in order to improve the following deficits and impairments:  Increased edema,  Increased fascial restricitons, Pain  Visit Diagnosis: Pain in right thigh  Lymphedema, not elsewhere classified     Problem List Patient Active Problem List   Diagnosis Date Noted  . Liver metastases ([place]) 02/23/2016  . 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 Zoster 04/04/2015  . Nausea with vomiting 11/18/2014  . Constipation 11/18/2014  . Left-sided thoracic back pain   . Bone metastases ([place]) 11/16/2014  . Back pain 11/15/2014  . Uncontrolled pain 11/14/2014  .  Post-lymphadenectomy lymphedema of arm 05/31/2014  . Chest wall pain 03/21/2014  . Abnormal LFTs (liver function tests) 09/12/2013  . Malignant neoplasm of upper-inner quadrant of right breast in female, estrogen receptor positive ([place]) 08/18/2013  . 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

## 2016-05-27 ENCOUNTER — Telehealth: Payer: Self-pay | Admitting: *Deleted

## 2016-05-27 ENCOUNTER — Ambulatory Visit: Payer: 59 | Admitting: Physical Therapy

## 2016-05-27 DIAGNOSIS — M79651 Pain in right thigh: Secondary | ICD-10-CM | POA: Diagnosis not present

## 2016-05-27 DIAGNOSIS — M62838 Other muscle spasm: Secondary | ICD-10-CM

## 2016-05-27 DIAGNOSIS — M25551 Pain in right hip: Secondary | ICD-10-CM

## 2016-05-27 NOTE — Telephone Encounter (Signed)
Ms. Plumley called and states she wants to hold off on the CTA for now. She is going to try Systemic Therapy. She will get back to [place] within 8 wks if she would like to proceed with Y-90.(see Dr. Jana Hakim office note) I have cancelled the CTA for this week.Cathren Harsh

## 2016-05-27 NOTE — Therapy (Signed)
[facility] [address], [place], [place], [address] Phone: [phone]   Fax:  [phone]  Physical Therapy Treatment  Patient Details  Name: [name] [name] MRN: [id number] Date of Birth: [birth year] Referring Provider: Dr. Kyung Rudd  Encounter Date: 05/27/2016      PT End of Session - 05/27/16 1308    Visit Number 24  pelvis   Date for PT Re-Evaluation 06/25/16  pelvis   PT Start Time 1230   PT Stop Time 1310   PT Time Calculation (min) 40 min   Activity Tolerance Patient tolerated treatment well   Behavior During Therapy [facility] for tasks assessed/performed      Past Medical History:  Diagnosis Date  . Bone metastases ([place]) dx'd 05/2014  . Breast cancer ([place]) dx'd 2005/2011  . Peripheral vascular disease ([place]) 02/2010   blood clot related to porta cath  . PONV (postoperative nausea and vomiting)   . S/P radiation therapy 07/17/2014 through 08/02/2014    Left mediastinum, left seventh rib 3250 cGy in 13 sessions   . S/P radiation therapy 12/11/2014 through 12/22/2014    Left parietal calvarium 2400 cGy in 8 sessions   . Seizures ([place]) 2010   Isolated incident.    Past Surgical History:  Procedure Laterality Date  . AXILLARY LYMPH NODE DISSECTION  Dec. 2011  . BREAST LUMPECTOMY  2005  . IR GENERIC HISTORICAL  05/21/2016   IR RADIOLOGIST EVAL & MGMT 05/21/2016 Sandi Mariscal, MD [facility] INTERV RAD  . MEDIASTINOTOMY CHAMBERLAIN MCNEIL Left 06/02/2013   Procedure: MEDIASTINOTOMY CHAMBERLAIN MCNEIL;  Surgeon: [name], MD;  Location: [place];  Service: Thoracic;  Laterality: Left;  LEFT ANTERIOR MEDIASTINOTOMY   . PORTACATH PLACEMENT  12/11  . removal portacath      There were no vitals filed for this visit.      Subjective Assessment - 05/27/16  1235    Subjective My urine stream is 25% better and not splashing me as much.  I have tightness in right groin.  I am not walking alot.  When I do I use the cane. My right groin pain is 25% better but continue with the tightness.  I am not taking Femora for 8 weeks.    Pertinent History patient vomited just prior to treatment today   How long can you walk comfortably? more she walks patient will have increased pain that stops her from walking further   Diagnostic tests x rays of back show no fractures;    Patient Stated Goals walk 20 minutes with minimal limp   Currently in Pain? Yes   Pain Score 3    Pain Location Pelvis   Pain Orientation Right   Pain Descriptors / Indicators Tightness   Pain Type Chronic pain   Pain Onset More than a month ago   Pain Frequency Constant   Aggravating Factors  walking   Pain Relieving Factors using a cane with walking   Multiple Pain Sites No                      Pelvic Floor Special Questions - 05/27/16 0001    Pelvic Floor Internal Exam Patient approves physical therapist to perform pelvic floor muscle assessment   Exam Type Vaginal           [facility] Adult PT Treatment/Exercise - 05/27/16 0001      Manual Therapy   Manual Therapy Internal Pelvic Floor;Soft tissue mobilization   Soft  tissue mobilization right quad, right hip adductor, right hamstring   Internal Pelvic Floor bil. sides of urethra with left tighter than right. Gentle soft tissue work to post. introitus                  PT Short Term Goals - 02/12/16 1226      PT SHORT TERM GOAL #1   Title pain with walking decreased >/= 25%   Time 4   Period Weeks   Status Achieved           PT Long Term Goals - 05/27/16 1313      PT LONG TERM GOAL #1   Title indpendent with HEP   Time 8   Period Weeks   Status On-going     PT LONG TERM GOAL #2   Title pain with walking decreased >/= 75%   Time 8   Period Weeks   Status On-going  using a cane now      PT LONG TERM GOAL #3   Title ability to flex her right hip with right groin pain decreased >/= 50% due to improved tissue mobility   Time 8   Period Weeks   Status Achieved     PT LONG TERM GOAL #4   Title waking up in middle of night with pain decreased >/=50%   Time 8   Period Weeks   Status Achieved     PT LONG TERM GOAL #5   Title reduction of pain by end of day >/= 50% due to increase tissue mobility   Time 8   Period Weeks   Status On-going           Long Term Clinic Goals - 05/23/16 1123      CC Long Term Goal  #2   Title Pt. will report swelling is adequately managed to enable ADL function at a consistent level.   Status On-going     CC Long Term Goal  #4   Title Pain/discomfort at right axilla area will be controlled at 6/10 or less.   Status On-going     CC Long Term Goal  #5   Title Patient will avoid infection by ongoing management of her lymphedema at right breast/axilla/upper arm areas.   Status On-going     CC Long Term Goal  #6   Title Pain in right groin/ischial tuberosity area will be controlled at 3/10 level or less to enable walking with less pain.   Status On-going            Plan - 05/27/16 1309    Clinical Impression Statement Urine stream is 40% better and still will splash on patient. Tightness in right thigh is 50% better after treatment. Patient is using her cane for long walks due to pain.  Patient has not been walking as much due to weather.  Patient has increased tissue mobility of bilateral sides of urethra. Patient will benefit from skilled therapy to reduce pain.    Rehab Potential Good   Clinical Impairments Affecting Rehab Potential active cancer   PT Frequency 2x / week   PT Duration 8 weeks   PT Treatment/Interventions ADLs/Self Care Home Management;Therapeutic activities;Therapeutic exercise;Patient/family education;Manual techniques;Manual lymph drainage;Scar mobilization   PT Next Visit Plan  soft tissue work,  myofascial release,    PT Home Exercise Plan progress as needed   Consulted and Agree with Plan of Care Patient      Patient will benefit from skilled therapeutic intervention in  order to improve the following deficits and impairments:  Increased edema, Increased fascial restricitons, Pain  Visit Diagnosis: Pain in right thigh  Other muscle spasm  Pain in right hip     Problem List Patient Active Problem List   Diagnosis Date Noted  . Liver metastases ([place]) 02/23/2016  . 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 Zoster 04/04/2015  . Nausea with vomiting 11/18/2014  . Constipation 11/18/2014  . Left-sided thoracic back pain   . Bone metastases ([place]) 11/16/2014  . Back pain 11/15/2014  . Uncontrolled pain 11/14/2014  . Post-lymphadenectomy lymphedema of arm 05/31/2014  . Chest wall pain 03/21/2014  . Abnormal LFTs (liver function tests) 09/12/2013  . Malignant neoplasm of upper-inner quadrant of right breast in female, estrogen receptor positive ([place]) 08/18/2013  . 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, [place], [place], [address] Phone: [phone]   Fax:  [phone]  Name: [name] [name] MRN: [id number] Date of Birth: 10/23/1960

## 2016-05-28 ENCOUNTER — Telehealth: Payer: Self-pay | Admitting: *Deleted

## 2016-05-28 NOTE — Telephone Encounter (Signed)
"  [facility] calling for clarification of Afinitor 2.5 mg order.  Reads take one daily but quantity is only fifteen pills.  Could you ask them to sign what was faxed yesterday.  Mark urgent.  The patient is calling for this order and we need an answer."

## 2016-05-29 ENCOUNTER — Other Ambulatory Visit (HOSPITAL_COMMUNITY): Payer: 59

## 2016-05-29 ENCOUNTER — Ambulatory Visit (HOSPITAL_COMMUNITY): Payer: 59

## 2016-05-29 ENCOUNTER — Other Ambulatory Visit: Payer: Self-pay | Admitting: Oncology

## 2016-05-29 ENCOUNTER — Telehealth: Payer: Self-pay | Admitting: Emergency Medicine

## 2016-05-29 NOTE — Telephone Encounter (Signed)
Patient called with complaints of left side jaw pain. States that this started out of the blue and has worsened since Tuesday 05/27/16. Patient states both her ears are popping when she swallows. Patient is scheduled to see her dentist today at 3:00 for eval.   Dr Jana Hakim spoke with patient's dentist today. No further orders at this time. Patient scheduled to see Dr Jana Hakim tomorrow in the office.

## 2016-05-30 ENCOUNTER — Ambulatory Visit: Payer: 59 | Attending: Oncology | Admitting: Physical Therapy

## 2016-05-30 ENCOUNTER — Ambulatory Visit (HOSPITAL_BASED_OUTPATIENT_CLINIC_OR_DEPARTMENT_OTHER): Payer: 59 | Admitting: Oncology

## 2016-05-30 DIAGNOSIS — C787 Secondary malignant neoplasm of liver and intrahepatic bile duct: Secondary | ICD-10-CM | POA: Diagnosis not present

## 2016-05-30 DIAGNOSIS — M8580 Other specified disorders of bone density and structure, unspecified site: Secondary | ICD-10-CM | POA: Diagnosis not present

## 2016-05-30 DIAGNOSIS — Z170 Estrogen receptor positive status [ER+]: Secondary | ICD-10-CM | POA: Diagnosis not present

## 2016-05-30 DIAGNOSIS — Z79811 Long term (current) use of aromatase inhibitors: Secondary | ICD-10-CM | POA: Diagnosis not present

## 2016-05-30 DIAGNOSIS — I890 Lymphedema, not elsewhere classified: Secondary | ICD-10-CM

## 2016-05-30 DIAGNOSIS — C50211 Malignant neoplasm of upper-inner quadrant of right female breast: Secondary | ICD-10-CM | POA: Diagnosis not present

## 2016-05-30 DIAGNOSIS — M79651 Pain in right thigh: Secondary | ICD-10-CM | POA: Diagnosis not present

## 2016-05-30 DIAGNOSIS — C771 Secondary and unspecified malignant neoplasm of intrathoracic lymph nodes: Secondary | ICD-10-CM

## 2016-05-30 NOTE — Therapy (Signed)
[place], [place], [address] Phone: [phone]   Fax:  [phone]  Physical Therapy Treatment  Patient Details  Name: [name] [name] MRN: [id number] Date of Birth: 09/13/1960 Referring Provider: Dr. Kyung Rudd  Encounter Date: 05/30/2016      PT End of Session - 05/30/16 1207    Visit Number 18  for lymph   Number of Visits 25  for lymph   Date for PT Re-Evaluation 06/25/16   Authorization - Number of Visits 140  for 2018   Activity Tolerance Patient tolerated treatment well   Behavior During Therapy [facility] for tasks assessed/performed      Past Medical History:  Diagnosis Date  . Bone metastases ([place]) dx'd 05/2014  . Breast cancer ([place]) dx'd 2005/2011  . Peripheral vascular disease ([place]) 02/2010   blood clot related to porta cath  . PONV (postoperative nausea and vomiting)   . S/P radiation therapy 07/17/2014 through 08/02/2014    Left mediastinum, left seventh rib 3250 cGy in 13 sessions   . S/P radiation therapy 12/11/2014 through 12/22/2014    Left parietal calvarium 2400 cGy in 8 sessions   . Seizures ([place]) 2010   Isolated incident.    Past Surgical History:  Procedure Laterality Date  . AXILLARY LYMPH NODE DISSECTION  Dec. 2011  . BREAST LUMPECTOMY  2005  . IR GENERIC HISTORICAL  05/21/2016   IR RADIOLOGIST EVAL & MGMT 05/21/2016 Sandi Mariscal, MD [facility] INTERV RAD  . MEDIASTINOTOMY CHAMBERLAIN MCNEIL Left 06/02/2013   Procedure: MEDIASTINOTOMY CHAMBERLAIN MCNEIL;  Surgeon: [name], MD;  Location: [name];  Service: Thoracic;  Laterality: Left;  LEFT ANTERIOR MEDIASTINOTOMY   . PORTACATH PLACEMENT  12/11  . removal portacath      There were no vitals filed for this visit.      Subjective Assessment - 05/30/16 1026    Subjective My jaw problem got fixed.  It was stress.   Currently in Pain? Yes   Pain Score 2    Pain Location Groin   Pain Orientation Right   Pain Descriptors / Indicators Tightness  less tight   Pain Type Chronic pain   Pain Onset More than a month ago   Aggravating Factors  walking   Pain Relieving Factors using a cane with walking, or not walking as much; therapy   Pain Score 6   Pain Location Axilla   Pain Orientation Right   Pain Descriptors / Indicators Other (Comment)  full   Pain Onset More than a month ago   Pain Frequency Constant   Aggravating Factors  more swelling   Pain Relieving Factors therapys                         [facility] Adult PT Treatment/Exercise - 05/30/16 0001      Manual Therapy   Myofascial Release right axilla with focus on scar tightness   Manual Lymphatic Drainage (MLD) In left sidelying, posterior interaxillary anastomosis and right axillo-inguinal anastomosis; in supine, short neck, left axilla and anterior interaxillary anastomosis, right groin and axillo-inguinal anastomosis, and right upper extremity from fingers to shoulder.  In right sidelying, left periscapular area toward left groin; extra time spent on the latter today.   Other Manual Therapy soft tissue work at superior aspect of right medial and upper thigh in prone position for pain relief and muscle relaxation, gentle myofascial release at upper thigh, with releases today from far  medial to lateral aspects of posterior thigh; extra time on pulling at upper thigh today                  PT Short Term Goals - 02/12/16 1226      PT SHORT TERM GOAL #1   Title pain with walking decreased >/= 25%   Time 4   Period Weeks   Status Achieved           PT Long Term Goals - 05/27/16 1313      PT LONG TERM GOAL #1   Title indpendent with HEP   Time 8   Period Weeks   Status On-going     PT LONG TERM GOAL #2   Title pain with walking decreased >/= 75%   Time  8   Period Weeks   Status On-going  using a cane now     PT LONG TERM GOAL #3   Title ability to flex her right hip with right groin pain decreased >/= 50% due to improved tissue mobility   Time 8   Period Weeks   Status Achieved     PT LONG TERM GOAL #4   Title waking up in middle of night with pain decreased >/=50%   Time 8   Period Weeks   Status Achieved     PT LONG TERM GOAL #5   Title reduction of pain by end of day >/= 50% due to increase tissue mobility   Time 8   Period Weeks   Status On-going           Long Term Clinic Goals - 05/23/16 1123      CC Long Term Goal  #2   Title Pt. will report swelling is adequately managed to enable ADL function at a consistent level.   Status On-going     CC Long Term Goal  #4   Title Pain/discomfort at right axilla area will be controlled at 6/10 or less.   Status On-going     CC Long Term Goal  #5   Title Patient will avoid infection by ongoing management of her lymphedema at right breast/axilla/upper arm areas.   Status On-going     CC Long Term Goal  #6   Title Pain in right groin/ischial tuberosity area will be controlled at 3/10 level or less to enable walking with less pain.   Status On-going            Plan - 05/30/16 1217    Clinical Impression Statement Had increased swelling at right hand and significant tightness at right upper thigh today; continues to be helped for these problems with therapy.   Rehab Potential Good   Clinical Impairments Affecting Rehab Potential active cancer   PT Frequency 2x / week   PT Duration 12 weeks   PT Treatment/Interventions ADLs/Self Care Home Management;Therapeutic activities;Therapeutic exercise;Patient/family education;Manual techniques;Manual lymph drainage;Scar mobilization   PT Next Visit Plan  manual lymph drainage, soft tissue work, myofascial release,    Consulted and Agree with Plan of Care Patient      Patient will benefit from skilled therapeutic  intervention in order to improve the following deficits and impairments:  Increased edema, Increased fascial restricitons, Pain  Visit Diagnosis: Pain in right thigh  Lymphedema, not elsewhere classified     Problem List Patient Active Problem List   Diagnosis Date Noted  . Liver metastases ([place]) 02/23/2016  . 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 [place] 04/04/2015  .  Nausea with vomiting 11/18/2014  . Constipation 11/18/2014  . Left-sided thoracic back pain   . Bone metastases ([place]) 11/16/2014  . Back pain 11/15/2014  . Uncontrolled pain 11/14/2014  . Post-lymphadenectomy lymphedema of arm 05/31/2014  . Chest wall pain 03/21/2014  . Abnormal LFTs (liver function tests) 09/12/2013  . Malignant neoplasm of upper-inner quadrant of right breast in female, estrogen receptor positive ([place]) 08/18/2013  . 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

## 2016-05-30 NOTE — Progress Notes (Signed)
[place]  [phone] [phone]     ID: [name] [name] OB: 12/09/1960  MR#: [id number]  ULA#:[id number]  PCP: Pcp Not In System GYN:  [name] [name] SU:  OTHER MD: [name] [name], [name] [name], [name] [name], [name] [name], [name] [name], [name] [name]   CHIEF COMPLAINT: Stage IV breast cancer  CURRENT TREATMENT: Fulvestrant, denosumab, palbociclib, letrozole  INTERVAL HISTORY:   [name] [name] returns today accompanied by her husband for further discussion and in this case definitive treatment planning. [name] [name] has been doing great deal of social searching, reading, research, and getting opinions from physicians try to figure out vertigo. The bottom line is that the current treatment that we have been doing has not completely controlled her cancer as measured by her liver lesions. Given the progression has been small it has been relentless.  Accordingly we have considered multiple possibilities including Y-90 radio embolization, adding everolimus, adding letrozole, adding pembrolizumab, and changing the palbociclib to ribociclib to minimize concerns regarding low counts (at the cost of concerns regarding QTC prolongation or more diarrhea perhaps).  This is then a difficult last 2 weeks but [name] [name] has finally settled that but she really wants to do at this point is simply add letrozole to her current treatment. This is reasonable. She is very concerned because letrozole in the past. Irritated her liver and caused a mild persistent increase in her transaminases. Nevertheless she began on letrozole 05/26/2016. So far she is tolerating it well.  REVIEW OF SYiSTEMS: [name] [name] has a palpable mid chest anterior superior area which is not symptomatic except for being palpable. This concerns her and she wonders if we should proceed to radiation of that area. She had significant concerns regarding jaw problems from her denosumab. She just was seen by her  dentist at a very extensive evaluation was entirely negative. She has mild TMJ. Since the evaluation her symptoms have improved spontaneously. She continues to have fatigue, very irregular bowel movements, and chronic pain relating to her pelvic bone lesions which are undergoing physical therapy, but aside from these issues a detailed review of systems today was stable  BREAST CANCER HISTORY: From doctor [name] [name]'s intake note 03/20/2004:  "The patient is a very pleasant 56 year old female, without significant past medical history.  Her family history is significant for a sister who at age 41 was diagnosed with invasive ductal carcinoma.  She is a breast cancer survivor at age 30 now.  The patient states that she has never really had a screening mammogram until October 2005, when she felt that it was time for her to start having mammograms done on a yearly basis.  Therefore, on 01/26/04, she underwent a screening mammogram and an abnormality was detected in the upper outer right breast.  She, therefore, underwent spot compression views of both the right and the left breast.  The left breast revealed a well-defined mass in the upper outer left quadrant, present at the 2 o'clock position, measuring 1.8 cm, 6 cm from the nipple.  This, by ultrasound, was felt to be a simple cyst measuring 1.8 cm.  On the right breast, a spiculated mass was noted in the upper outer right quadrant.  The ultrasound revealed a shadowing irregular solid mass at the 10:30 position, 9 cm from the nipple, measuring 1.2 cm in greatest dimension, correlating with the spiculated mass seen on the mammogram.  The right axilla was negative ultrasonically.  Because of this, the patient underwent a needle biopsy of the  right breast and the biopsy was positive invasive mammary carcinoma that showed features consistent with a high-grade invasive ductal carcinoma associated with desmoplastic stroma.  No in situ component was seen and no definite  lymphovascular invasion was identified.  On the core biopsy, the tumor measured about 0.8 cm.  Because of this, she was seen by [name] [name] and the patient was taken to the [facility] on March 15, 2004.  She underwent a right breast lumpectomy with sentinel node biopsy.  The final pathology revealed an invasive ductal carcinoma, measuring 1.7 cm, grade 2 of 3.  Margins were free of tumor.  Atypical lobular hyperplasia was noted.  One sentinel node was removed which was negative for metastatic disease.  The tumor was staged at T1c, N0 MX.  It was estrogen receptor positive, progesterone receptor positive.  HER-2/neu was 2+.  FISH was negative.  All margins were free of tumor.  She is now seen in Medical Oncology for further evaluation and management of this newly diagnosed T1c, node negative, stage I, invasive ductal carcinoma of the right breast."  Her subsequent history is as detailed below   PAST MEDICAL HISTORY: Past Medical History:  Diagnosis Date  . Bone metastases ([name]) dx'd 05/2014  . Breast cancer ([place]) dx'd 2005/2011  . Peripheral vascular disease ([place]) 02/2010   blood clot related to porta cath  . PONV (postoperative nausea and vomiting)   . S/P radiation therapy 07/17/2014 through 08/02/2014    Left mediastinum, left seventh rib 3250 cGy in 13 sessions   . S/P radiation therapy 12/11/2014 through 12/22/2014    Left parietal calvarium 2400 cGy in 8 sessions   . Seizures ([place]) 2010   Isolated incident.    PAST SURGICAL HISTORY: Past Surgical History:  Procedure Laterality Date  . AXILLARY LYMPH NODE DISSECTION  Dec. 2011  . BREAST LUMPECTOMY  2005  . IR GENERIC HISTORICAL  05/21/2016   IR RADIOLOGIST EVAL & MGMT 05/21/2016 Sandi Mariscal, MD [facility] INTERV RAD  . MEDIASTINOTOMY CHAMBERLAIN MCNEIL Left 06/02/2013   Procedure:  MEDIASTINOTOMY CHAMBERLAIN MCNEIL;  Surgeon: [name], MD;  Location: [facility];  Service: Thoracic;  Laterality: Left;  LEFT ANTERIOR MEDIASTINOTOMY   . PORTACATH PLACEMENT  12/11  . removal portacath      FAMILY HISTORY Family History  Problem Relation Age of Onset  . COPD Mother   . Breast cancer Sister 63   The patient's father is living, 17 years old as of may 2015. He lives in [place]. The patient's mother died from complications of COPD at the age of 63. These has 2 brothers, one sister. Her sister developed breast cancer at the age of 56. She is doing well. The patient herself underwent genetic testing at [facility] in 2011 and was found to be BRCA negative  GYNECOLOGIC HISTORY:  Menarche age 68, she is GX P0. She stopped having periods with her initial chemotherapy in 2006.  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 Her husband [name] [name] is a Medical illustrator in [place]. He has a child from a prior marriage. At home they have 2 rescue dogs, [place]o and [place]. The patient is religious but not a church attender    ADVANCED DIRECTIVES: In place; at the 08/04/2014 visit in particular the patient was very clear, with her husband present, that she would not want any kind of feeding tubes or "other tubes" if her condition deteriorated.  HEALTH MAINTENANCE: Social History  Substance Use Topics  . Smoking status: Never Smoker  . Smokeless tobacco: Never Used  . Alcohol use No     Colonoscopy:  PAP:  Bone density: March 2015; mild osteopenia  Lipid panel:  Allergies  Allergen Reactions  . 2nd Skin Quick Heal Other (See Comments)    Other Reaction: Skin peels  . Decadron [Dexamethasone] Other (See Comments)    Patient does not tolerate steroids.   . Dilaudid [Hydromorphone] Nausea And Vomiting  . Enoxaparin Other (See Comments)    unknown  . Fluconazole Swelling    Liver toxicity  . Hydromorphone  Hcl Nausea And Vomiting  . Morphine And Related Nausea And Vomiting  . Protonix [Pantoprazole Sodium] Other (See Comments)    Patient reports it caused thrush.  . Tegaderm Ag Mesh [Silver]     Current Outpatient Prescriptions  Medication Sig Dispense Refill  . ALPRAZolam (XANAX) 0.5 MG tablet Take 1 tablet (0.5 mg total) by mouth 2 (two) times daily as needed for anxiety. 30 tablet 0  . B Complex-C (B-COMPLEX WITH VITAMIN C) tablet Take 1 tablet by mouth daily. Reported on 03/27/2015    . calcium carbonate (TUMS - DOSED IN MG ELEMENTAL CALCIUM) 500 MG chewable tablet Chew 1 tablet by mouth as directed.    . cholecalciferol 2000 UNITS tablet Take 1 tablet (2,000 Units total) by mouth daily.    . ciprofloxacin (CIPRO) 500 MG tablet Take 1 tablet (500 mg total) by mouth 2 (two) times daily. 10 tablet 0  . ciprofloxacin (CIPRO) 500 MG tablet Take 1 tablet (500 mg total) by mouth 2 (two) times daily. 10 tablet 0  . Diphenhyd-Hydrocort-Nystatin (FIRST-DUKES MOUTHWASH) SUSP 5-10 ml qid SWISH AND SPIT 240 mL 3  . everolimus (AFINITOR) 2.5 MG tablet Take 1 tablet (2.5 mg total) by mouth daily. Caution:chemotherapy. 15 tablet 6  . folic acid (FOLVITE) 1 MG tablet Take 1 tablet (1 mg total) by mouth daily.    Marland Kitchen letrozole (FEMARA) 2.5 MG tablet Take 1 tablet (2.5 mg total) by mouth daily. 90 tablet 3  . loratadine-pseudoephedrine (CLARITIN-D 24-HOUR) 10-240 MG 24 hr tablet Take 1 tablet by mouth daily.    . Melatonin 3 MG TABS Take 3 mg by mouth at bedtime.    . methylPREDNISolone (MEDROL) 4 MG tablet Take 1 tablet (29m) x3 per day for 1 day, then 1 tablet x2 per day for 1 day, then 1 tablet per day for 1 day, then discontinue. 6 tablet 0  . naproxen sodium (ANAPROX) 220 MG tablet Take 220 mg by mouth 2 (two) times daily with a meal.    . palbociclib (IBRANCE) 75 MG capsule Take 1 capsule (75 mg total) by mouth daily with breakfast. Take whole with food. 21 capsule 6  . pentoxifylline (TRENTAL) 400 MG CR  tablet Take 1 tablet (400 mg total) by mouth 2 (two) times daily. Take 400 units vitamin E twice a day with this medication. 60 tablet 3  . phenazopyridine (PYRIDIUM) 100 MG tablet Take 1 tablet (100 mg total) by mouth 3 (three) times daily as needed for pain. 30 tablet 0  . phenazopyridine (PYRIDIUM) 200 MG tablet Take 1 tablet (200 mg total) by mouth 3 (three) times daily as needed for pain. 30 tablet 1  . RABEprazole Sodium 5 MG CPSP Take 5 mg by mouth daily. 30 capsule 3  . saccharomyces boulardii (FLORASTOR) 250 MG capsule Take 250 mg by mouth daily.     .Marland Kitchen  vitamin E (VITAMIN E) 400 UNIT capsule Take 1 capsule (400 Units total) by mouth 2 (two) times daily. 60 capsule 3   No current facility-administered medications for this visit.     OBJECTIVE: Middle-aged white woman Who appears stated age  There were no vitals filed for this visit.   There is no height or weight on file to calculate BMI.   Patient refused vitals 05/30/16    ECOG FS: 1  Sclerae unicteric, pupils round and equal Oropharynx clear and moist-- no thrush or other lesions No cervical or supraclavicular adenopathy Lungs no rales or rhonchi Heart regular rate and rhythm Abd soft, nontender, positive bowel sounds, no hepatomegaly MSK no focal spinal tenderness, no upper extremity lymphedema Neuro: nonfocal, well oriented, appropriate affect Breasts: Deferred  LAB RESULTS:   CMP     Component Value Date/Time   NA 137 05/05/2016 1127   K 4.2 05/05/2016 1127   CL 103 12/14/2014 0800   CL 105 05/06/2012 1333   CO2 27 05/05/2016 1127   GLUCOSE 89 05/05/2016 1127   GLUCOSE 124 (H) 05/06/2012 1333   BUN 8.7 05/05/2016 1127   CREATININE 0.9 05/05/2016 1127   CALCIUM 9.9 05/05/2016 1127   PROT 7.5 05/05/2016 1127   ALBUMIN 4.2 05/05/2016 1127   AST 32 05/05/2016 1127   ALT 38 05/05/2016 1127   ALKPHOS 80 05/05/2016 1127   BILITOT 0.38 05/05/2016 1127   GFRNONAA >60 12/14/2014 0800   GFRAA >60 12/14/2014 0800     No results found for: SPEP  Lab Results  Component Value Date   WBC 3.4 (L) 05/23/2016   NEUTROABS 1.9 05/23/2016   HGB 11.6 05/23/2016   HCT 32.7 (L) 05/23/2016   MCV 105.8 (H) 05/23/2016   PLT 259 05/23/2016      Chemistry      Component Value Date/Time   NA 137 05/05/2016 1127   K 4.2 05/05/2016 1127   CL 103 12/14/2014 0800   CL 105 05/06/2012 1333   CO2 27 05/05/2016 1127   BUN 8.7 05/05/2016 1127   CREATININE 0.9 05/05/2016 1127      Component Value Date/Time   CALCIUM 9.9 05/05/2016 1127   ALKPHOS 80 05/05/2016 1127   AST 32 05/05/2016 1127   ALT 38 05/05/2016 1127   BILITOT 0.38 05/05/2016 1127     No results for input(s): INR in the last 168 hours.  Urinalysis    Component Value Date/Time   COLORURINE YELLOW 11/17/2014 0143   APPEARANCEUR CLOUDY (A) 11/17/2014 0143   LABSPEC 1.005 05/16/2016 1201   PHURINE 6.5 05/16/2016 1201   PHURINE 6.5 11/17/2014 0143   GLUCOSEU Negative 05/16/2016 1201   HGBUR Negative 05/16/2016 1201   HGBUR NEGATIVE 11/17/2014 0143   BILIRUBINUR Negative 05/16/2016 1201   KETONESUR Negative 05/16/2016 1201   KETONESUR NEGATIVE 11/17/2014 0143   PROTEINUR Negative 05/16/2016 1201   PROTEINUR NEGATIVE 11/17/2014 0143   UROBILINOGEN 0.2 05/16/2016 1201   NITRITE Negative 05/16/2016 1201   NITRITE NEGATIVE 11/17/2014 0143   LEUKOCYTESUR Small 05/16/2016 1201     STUDIES: Mr Pelvis W Wo Contrast  Addendum Date: 05/15/2016   ADDENDUM REPORT: 05/15/2016 09:04 ADDENDUM: This addendum is given for the purpose of comparing the current study to pelvic MRI and 12/12/2015. Right sacral lesion series 21 image 9 measures 1.8 cm transverse compared to 1.4 cm. Left sacral lesion on the same image measures 1.5 cm compared to 1.4 cm. Right acetabular roof lesion on image 13 of series 21  measures 1.0 cm compared to 0.9 cm. Posterior right acetabular lesion on image 17 measures 0.8 cm compared to 0.6 cm. The lesion does not appear to  enhance as avidly as on the prior exam. Lesion in the anterior right acetabulum measuring 1.9 cm transverse on image 17 of series 21 is not definitely seen on the prior exam. Right superior pubic ramus lesion on image 17 of series 121 21 measures 0.8 cm, unchanged. Abnormal marrow signal throughout the right inferior pubic ramus with enhancement is not notably changed. Overall, there has been mild progression of metastatic disease. Electronically Signed   By: Inge Rise M.D.   On: 05/15/2016 09:04   Result Date: 05/15/2016 CLINICAL DATA:  Patient with a history of metastatic breast cancer. Pain about the pubic ramus. EXAM: MRI PELVIS WITHOUT AND WITH CONTRAST TECHNIQUE: Multiplanar multisequence MR imaging of the pelvis was performed both before and after administration of intravenous contrast. CONTRAST:  15 ml MULTIHANCE GADOBENATE DIMEGLUMINE 529 MG/ML IV SOLN COMPARISON:  PET CT scan 02/22/2016. FINDINGS: Abnormal signal and enhancement are seen throughout the right inferior pubic ramus consistent with metastatic disease. A small focus of abnormal signal and enhancement in the posterior right acetabulum and right superior pubic ramus are also consistent with metastatic disease. A larger deposit in the anterior right acetabulum measures 2.0 cm transverse by 1.7 cm AP on image 17 of series 21. Additional smaller metastatic deposits are seen scattered throughout the pelvis including the sacrum. Metastatic deposits are also identified in the L3 and L4 vertebral bodies, larger in L3 with the lesion measures 3.1 cm transverse by 1.6 cm craniocaudal. Minimal inferior endplate compression fracture anteriorly in L3 is identified. There is no fracture. No avascular necrosis of the femoral heads. Hips, sacroiliac joints and symphysis pubis appear normal. Musculature is intact. No evidence of bursitis. IMPRESSION: Multifocal osseous metastases throughout the pelvis, largest in the right inferior pubic ramus and  anterior right acetabulum. Metastatic deposits in the L3 and L4 vertebral bodies are also identified. Very mild inferior endplate compression fracture of L3 is seen in the coronal plane only. Electronically Signed: By: Inge Rise M.D. On: 05/08/2016 14:47   Mr Abdomen W Wo Contrast  Result Date: 05/08/2016 CLINICAL DATA:  Metastatic breast cancer with known left pleural, hepatic, and abdominal nodal metastasis. EXAM: MRI ABDOMEN WITHOUT AND WITH CONTRAST TECHNIQUE: Multiplanar multisequence MR imaging of the abdomen was performed both before and after the administration of intravenous contrast. CONTRAST:  54m MULTIHANCE GADOBENATE DIMEGLUMINE 529 MG/ML IV SOLN COMPARISON:  PET of 02/22/2016 and abdominal MRI of 02/14/2016. FINDINGS: Lower chest: Normal heart size. Re- demonstrated is left-sided pleural metastasis. Rind of T2 hyperintense left pleural thickening medially measures 1.5 cm on image 18/series 11 versus 1.3 cm at the same level on the prior. More cephalad medial posterior enhancing left pleural nodule measures 1.5 cm on image 17/ series 17002 and is unchanged. More anterior left pleural based nodule measures 1.4 cm on image 18/ series 17002 and is enlarged since the prior. Hepatobiliary: Hepatic metastasis are best evaluated on series 17002. Index central right hepatic lobe lesion measures 2.7 x 2.6 cm on image 40 versus 2.7 x 2.2 cm on the prior. A more peripheral right sided mass measures 4.9 x 3.9 cm on image 44 today versus 4.5 x 3.8 cm on the prior. A pericholecystic right liver lobe lesion measures 2.9 x 2.1 cm on image 52 today versus 1.8 x 1.6 cm on the prior. Far inferior right hepatic  lobe lesion measures 1.6 cm on image 70 today versus 1.2 cm on the prior. Cholelithiasis without acute cholecystitis or biliary duct dilatation. Pancreas:  Normal, without mass or ductal dilatation. Spleen:  Normal in size, without focal abnormality. Adrenals/Urinary Tract: Normal adrenal glands. Normal  kidneys, without hydronephrosis. Stomach/Bowel: Normal stomach and abdominal bowel loops. Vascular/Lymphatic: Aortic atherosclerosis. A circumaortic left renal vein. Portacaval node measures 8 mm on image 26/series 11 compared to 7 mm on the prior. Not pathologic by size criteria. Left abdominal retroperitoneal node measures 11 x 9 mm on image 51/series 1202 and is not significantly changed. Other:  No ascites.  No evidence of omental or peritoneal disease. Musculoskeletal: Osseous metastasis again identified. A right-sided T11 lesion measures 1.5 cm on image 23/series 11 versus 1.9 cm on the prior. Right-sided lamina lesion at L1 measures 11 mm on image 36/ series 11 and is unchanged. Inferior L3 lesion measures 3.1 x 1.9 cm on image 28/series 18. Compare 3.3 x 1.7 cm on the prior, similar. IMPRESSION: 1. Since the prior MRI of 02/14/2016, progression of hepatic metastasis. 2. Similar to mild progression of left pleural metastasis, incompletely imaged. 3. Similar borderline to mild abdominal adenopathy. 4. Similar to mild improvement in osseous metastasis. 5. Cholelithiasis. Electronically Signed   By: Abigail Miyamoto M.D.   On: 05/08/2016 13:29   Nm Pet Image Restag (ps) Skull Base To Thigh  Result Date: 05/14/2016 CLINICAL DATA:  Subsequent treatment strategy for restaging of right-sided breast cancer with bone, liver, left pleural, and abdominal nodal metastasis. EXAM: NUCLEAR MEDICINE PET SKULL BASE TO THIGH TECHNIQUE: 8.0 mCi F-18 FDG was injected intravenously. Full-ring PET imaging was performed from the skull base to thigh after the radiotracer. CT data was obtained and used for attenuation correction and anatomic localization. FASTING BLOOD GLUCOSE:  Value: 98 mg/dl COMPARISON:  PET of 02/22/2016.  Abdominal MRI of 05/08/2016. FINDINGS: NECK No hypermetabolic nodes within the neck.  No cervical adenopathy. CHEST A new medial left subpectoral node measures 5 mm and a S.U.V. max of 5.1 on image 43/series 4.  The previously described hypermetabolic more lateral left subpectoral node measures 4 mm and a S.U.V. max of 1.5 today versus 6 mm and a S.U.V. max of 2.0 on the prior exam. Index prevascular node measures 12 mm and a S.U.V. max of 8.8 today versus 11 mm and a S.U.V. max of 10.9 on the prior exam. Left internal mammary node measures 6 mm and a S.U.V. max of 5.2 on image 59/ series 4. Compare 8 mm and a S.U.V. max of 5.9 on the prior. Left-sided mediastinal hypermetabolism measures a S.U.V. max of 5.8 on approximately image 56/ series 4. Compare a S.U.V. max of 6.5 on the prior. Index anterior left pleural lesion measures 13 mm and a S.U.V. max of 8.4 on image 77/series 4. Compare 12 mm and a S.U.V. max of 8.3 on the prior. Right axillary node dissection. Left-sided pleural thickening and trace fluid are similar. Minimal right upper lobe nodularity on image 10/series 8 is not readily apparent on the prior but favored to represent a subpleural lymph node. Left apical scarring and fibrosis with similar low-level hypermetabolism. Left lower lobe rounded atelectasis. ABDOMEN/PELVIS Dominant right hepatic lobe subcapsular mass measures 5.0 cm and a S.U.V. max of 11.4 on image 92/series 4. Compare 4.7 cm and a S.U.V. max of 13.1 on the prior exam. More central right hepatic lobe lesion measures 2.3 cm and a S.U.V. max of 7.6 on image 91/series 4.  Compare 2.4 cm and a S.U.V. max of 7.5 on the prior. Portal caval node measures 9 mm and a S.U.V. max of 6.3 today versus 8 mm and a S.U.V. max of 6.3 on the prior. Left retroperitoneal hypermetabolism measures a S.U.V. max of 3.4 today versus a S.U.V. max of 3.5 on the prior. Aortic atherosclerosis. Circumaortic left renal vein. Mild pelvic floor laxity. SKELETON A new focus of hypermetabolism within the C4 vertebral body measures a S.U.V. max of 5.5. Hypermetabolism about the right side of the T11 vertebral body measures a S.U.V. max of 3.2 today versus a S.U.V. max of 6.2 on  the prior exam. Hypermetabolism about the right inferior pubic ramus measures a S.U.V. max of 4.1 today versus a S.U.V. max of 4.3 on the prior. Subtle nondisplaced fracture in this region is more apparent today. Example image 174/series 4. IMPRESSION: 1. Since the prior PET of 02/22/2016, overall mixed response to therapy. 2. The majority of sites of disease within the chest, including nodal stations and the left pleural space, or similar to minimally improved. There is a new medial left subpectoral hypermetabolic node with resolution of hypermetabolism in a more lateral left subpectoral node. 3. Similar disease within abdominal nodal stations and the liver. 4. Further improvement in hypermetabolism involving the T11 vertebral body. A new focus of hypermetabolism within C4 is suspicious for a new metastasis. 5. Right inferior pubic ramus hypermetabolism is similar. Nondisplaced pathologic fracture is more apparent today. Electronically Signed   By: Abigail Miyamoto M.D.   On: 05/14/2016 15:58   Ir Radiologist Eval & Mgmt  Result Date: 05/21/2016 Please refer to "Notes" to see consult details.   ASSESSMENT: 56 y.o. BRCA negative [place] woman with stage IV breast cancer, history as follows  (1)  S/p Right upper inner quadrant lumpectomy and sentinel lymph node sampling 03/15/2004 for a pT1c pN0. Stage IA invasive ductal carcinoma, grade 2, estrogen receptor 95% positive, progesterone receptor 65% positive, HER-2 not amplified; additional surgery 04/25/2004 for seroma or clearance showed no residual tumor  (2) adjuvant chemotherapy with cyclophosphamide and doxorubicin every 21 days x4 completed 07/19/2004  (3) adjuvant radiation given under Dr. Donella Stade in [place] completed July 2006  (4) the patient opted against adjuvant antiestrogen therapy  (5) genetics testing showed no BRCA mutations  (6) biopsy of a palpable right axillary mass 10/24/2009 showed invasive ductal carcinoma, grade 3, estrogen  receptor 100% positive, progesterone receptor 2% positive (alert score 5) HER-2 negative; no evidence of systemic disease on PET scanning  (7) completed 3 of 4 planned cycles of docetaxel and cyclophosphamide September 2011, fourth cycle omitted because of marked elevations in liver function tests  (8) an right axillary lymph node dissection 03/06/2010 showed 3/8 lymph nodes removed to be involved by tumor, with extracapsular extension.  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 mutation; everolimus was suggested as an available targeted agent  (18) exemestane started 03/31/2014, discontinued 10/31/2014 with evidence of progression  (a) everolimus added 04/03/2014 but not tolerated (cytopenias, elevated LFTs) even at minimal doses; stopped 04/17/2014  (19) fulvestrant started 12/20/2014  (a) palbociclib added at very low dose 04/03/2015 (starting dose 75 mg weekly)  (b) palbociclib dose gradually increased to 75 mg daily, 21/7, as of May 2017  (c) palbociclib dose increased to 100 mg daily, 21/ 7, beginning November mid- cycle  (d) palbociclib dose decreased to 75 mg daily beginning with cycle starting 05/25/2016  (e) letrozole 2.5 mg started 05/26/2016  (20) liver biopsy 03/20/2015 confirms metastatic carcinoma, still estrogen receptor positive at 100%, progesterone receptor negative, HER-2 equivocal with a signals ratio 1.41, number per cell 4.50.   (a) repeat liver biopsy December 2017 might show further changes (HER-2 positivity)  (21) immunohistochemistry for mismatched repair protein mutations 03/20/2015 showed normal major and minor MMR proteins, with a very low probability of microsatellite instability ([id number])  (22) adjuvant radiation 12/24/15-01/02/16 Site/dose:   1) Left T9 Rib / 24 Gy in 8 fx                         2) Right inferior pelvis/ 24 Gy in 8 fx  (23) consider pembrolizumab (obtained compassionate release from company)  (a) liver biopsy 03/20/2015 negative for PD-1      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 treated with Lovenox for 5-6 months  (c) right upper extremity lymphedema--receiving physical therapy  (d) hepatic steatosis with chronically elevated LFTs as well as unusual hepatic sensitivity to chemotherapy  (e) osteopenia with  the lowest T score -1.6 on bone density scan 06/20/2013  (i) on denosumab/ Xgeva Q28d  (f) radiation oncology (Dr Valere Dross) has reviewed prior radiation records in case there is further mediastinal involvement with dysphagia etc in which case palliative XRT could be considered  (a) radiation to left mediastinum/ left 7th rib 3250 cGy in 13 sessions04/18/2016 through 08/02/2014  (b) radiation to T11 area: 22 Gy in 7 sessions, last dose 11/27/2014  (c) radiation left parietal scalp region to be completed 12/22/2014  (d) radiation to sacral area completed 04/09/2015  (e) radiation to right inferior pelvis and left ninth rib (24 gray, 12/24/2015--01/02/2016)  (f) T11 was treated stereotactically with 14 gray in 1 fraction 03/12/2016.  (g) chest wall and perineal pain--improved post radiation treatments  (a) discussed celebrex/ carafate but holding off for now  (h) zoster diagnosed 04/04/2015-- on valacyclovir--resolved   PLAN: I spent approximately 40 minutes today with [name] [name] again reviewing all her options and finally coming up with a definitive plan that she is comfortable with. She is now on letrozole in addition to pembrolizumab fulvestrant and denosumab. She has been  reassured that her jaw is fine despite the denosumab, and we are dropping the palbociclib dose of 75 mg daily to minimize concerns regarding a low neutrophil count. We are going to follow the liver function tests every 2 weeks and we are going to accept some increase in her transaminases so long as they remain below 100 and there is not a persistently increasing trend.  She will be on this regimen for 2 months. On 07/10/2016 she will have a repeat liver MRI. We are looking for disease control. If we have not obtained disease control we can consider stopping the letrozole and switching to everolimus Lajoyce Corners, which will be my recommendation, moving to the yttrium radio embolization. We have put the [facility] [place] on the  list since we could not identify a favorable PD1 status and her liver biopsy. We are also postponing consideration of radiation to her anterior chest wall palpable non-symptomatic mass to see if it responds to the current change in treatment.  She is going to see me again on March 23 to make sure everything is going well and on April 20 to make a decision regarding how to proceed  She knows to call for any problems that may develop before her next visit here.   Chauncey Cruel, MD   05/30/2016 3:47 PM

## 2016-06-02 ENCOUNTER — Ambulatory Visit: Payer: 59 | Admitting: Physical Therapy

## 2016-06-02 ENCOUNTER — Telehealth: Payer: Self-pay | Admitting: *Deleted

## 2016-06-02 ENCOUNTER — Other Ambulatory Visit (HOSPITAL_BASED_OUTPATIENT_CLINIC_OR_DEPARTMENT_OTHER): Payer: 59

## 2016-06-02 DIAGNOSIS — C7951 Secondary malignant neoplasm of bone: Secondary | ICD-10-CM | POA: Diagnosis not present

## 2016-06-02 DIAGNOSIS — C773 Secondary and unspecified malignant neoplasm of axilla and upper limb lymph nodes: Secondary | ICD-10-CM

## 2016-06-02 DIAGNOSIS — I890 Lymphedema, not elsewhere classified: Secondary | ICD-10-CM

## 2016-06-02 DIAGNOSIS — M79651 Pain in right thigh: Secondary | ICD-10-CM | POA: Diagnosis not present

## 2016-06-02 DIAGNOSIS — C50211 Malignant neoplasm of upper-inner quadrant of right female breast: Secondary | ICD-10-CM

## 2016-06-02 DIAGNOSIS — Z170 Estrogen receptor positive status [ER+]: Secondary | ICD-10-CM

## 2016-06-02 DIAGNOSIS — C771 Secondary and unspecified malignant neoplasm of intrathoracic lymph nodes: Secondary | ICD-10-CM

## 2016-06-02 DIAGNOSIS — C787 Secondary malignant neoplasm of liver and intrahepatic bile duct: Secondary | ICD-10-CM

## 2016-06-02 LAB — CBC WITH DIFFERENTIAL/PLATELET
BASO%: 1.4 % (ref 0.0–2.0)
BASOS ABS: 0.1 10*3/uL (ref 0.0–0.1)
EOS ABS: 0.1 10*3/uL (ref 0.0–0.5)
EOS%: 2 % (ref 0.0–7.0)
HCT: 33.6 % — ABNORMAL LOW (ref 34.8–46.6)
HGB: 11.8 g/dL (ref 11.6–15.9)
LYMPH%: 12.7 % — ABNORMAL LOW (ref 14.0–49.7)
MCH: 37.5 pg — ABNORMAL HIGH (ref 25.1–34.0)
MCHC: 35.1 g/dL (ref 31.5–36.0)
MCV: 106.7 fL — AB (ref 79.5–101.0)
MONO#: 0.2 10*3/uL (ref 0.1–0.9)
MONO%: 5.8 % (ref 0.0–14.0)
NEUT#: 2.7 10*3/uL (ref 1.5–6.5)
NEUT%: 78.1 % — AB (ref 38.4–76.8)
Platelets: 307 10*3/uL (ref 145–400)
RBC: 3.15 10*6/uL — ABNORMAL LOW (ref 3.70–5.45)
RDW: 13.6 % (ref 11.2–14.5)
WBC: 3.5 10*3/uL — ABNORMAL LOW (ref 3.9–10.3)
lymph#: 0.4 10*3/uL — ABNORMAL LOW (ref 0.9–3.3)

## 2016-06-02 LAB — COMPREHENSIVE METABOLIC PANEL
ALT: 56 U/L — ABNORMAL HIGH (ref 0–55)
AST: 38 U/L — AB (ref 5–34)
Albumin: 4.1 g/dL (ref 3.5–5.0)
Alkaline Phosphatase: 87 U/L (ref 40–150)
Anion Gap: 9 mEq/L (ref 3–11)
BUN: 10 mg/dL (ref 7.0–26.0)
CALCIUM: 9.7 mg/dL (ref 8.4–10.4)
CHLORIDE: 99 meq/L (ref 98–109)
CO2: 26 mEq/L (ref 22–29)
Creatinine: 0.9 mg/dL (ref 0.6–1.1)
EGFR: 69 mL/min/{1.73_m2} — AB (ref >90)
GLUCOSE: 81 mg/dL (ref 70–140)
POTASSIUM: 4 meq/L (ref 3.5–5.1)
SODIUM: 134 meq/L — AB (ref 136–145)
Total Bilirubin: 0.45 mg/dL (ref 0.20–1.20)
Total Protein: 7.3 g/dL (ref 6.4–8.3)

## 2016-06-02 NOTE — Therapy (Signed)
[place], [place], [address] Phone: [phone]   Fax:  [phone]  Physical Therapy Treatment  Patient Details  Name: [name] [name] MRN: [id number] Date of Birth: [birth year] Referring Provider: Dr. Kyung Rudd  Encounter Date: 06/02/2016      PT End of Session - 06/02/16 1217    Visit Number 19  for lymph   Number of Visits 25  for lymph   Date for PT Re-Evaluation 06/25/16   Authorization - Number of Visits 140  for 2018   PT Start Time 1022   PT Stop Time 1101   PT Time Calculation (min) 39 min   Activity Tolerance Patient tolerated treatment well   Behavior During Therapy [facility] for tasks assessed/performed      Past Medical History:  Diagnosis Date  . Bone metastases ([place]) dx'd 05/2014  . Breast cancer ([place]) dx'd 2005/2011  . Peripheral vascular disease ([place]) 02/2010   blood clot related to porta cath  . PONV (postoperative nausea and vomiting)   . S/P radiation therapy 07/17/2014 through 08/02/2014    Left mediastinum, left seventh rib 3250 cGy in 13 sessions   . S/P radiation therapy 12/11/2014 through 12/22/2014    Left parietal calvarium 2400 cGy in 8 sessions   . Seizures ([place]) 2010   Isolated incident.    Past Surgical History:  Procedure Laterality Date  . AXILLARY LYMPH NODE DISSECTION  Dec. 2011  . BREAST LUMPECTOMY  2005  . IR GENERIC HISTORICAL  05/21/2016   IR RADIOLOGIST EVAL & MGMT 05/21/2016 Sandi Mariscal, MD [facility] INTERV RAD  . MEDIASTINOTOMY CHAMBERLAIN MCNEIL Left 06/02/2013   Procedure: MEDIASTINOTOMY CHAMBERLAIN MCNEIL;  Surgeon: [name], MD;  Location: [place];  Service: Thoracic;  Laterality: Left;  LEFT ANTERIOR MEDIASTINOTOMY   . PORTACATH PLACEMENT  12/11  . removal portacath      There were no  vitals filed for this visit.      Subjective Assessment - 06/02/16 1023    Currently in Pain? Yes   Pain Score 3    Pain Location Groin   Pain Orientation Right   Pain Score 6  5-6   Pain Location Axilla   Pain Orientation Right   Aggravating Factors  had a couple incidents over the weekend                         [facility] Adult PT Treatment/Exercise - 06/02/16 0001      Manual Therapy   Myofascial Release right axilla with focus on scar tightness   Manual Lymphatic Drainage (MLD) In left sidelying, posterior interaxillary anastomosis and right axillo-inguinal anastomosis; in supine, short neck, left axilla and anterior interaxillary anastomosis, right groin and axillo-inguinal anastomosis, and right upper extremity from fingers to shoulder.  In right sidelying, left periscapular area toward left groin; extra time spent on the latter today.   Other Manual Therapy soft tissue work at superior aspect of right medial and upper thigh in prone position for pain relief and muscle relaxation, gentle myofascial release at upper thigh, with releases today from far medial to lateral aspects of posterior thigh; extra time on pulling at upper thigh today                  PT Short Term Goals - 02/12/16 1226      PT SHORT TERM GOAL #1   Title pain with walking decreased >/= 25%   Time  4   Period Weeks   Status Achieved           PT Long Term Goals - 05/27/16 1313      PT LONG TERM GOAL #1   Title indpendent with HEP   Time 8   Period Weeks   Status On-going     PT LONG TERM GOAL #2   Title pain with walking decreased >/= 75%   Time 8   Period Weeks   Status On-going  using a cane now     PT LONG TERM GOAL #3   Title ability to flex her right hip with right groin pain decreased >/= 50% due to improved tissue mobility   Time 8   Period Weeks   Status Achieved     PT LONG TERM GOAL #4   Title waking up in middle of night with pain decreased >/=50%    Time 8   Period Weeks   Status Achieved     PT LONG TERM GOAL #5   Title reduction of pain by end of day >/= 50% due to increase tissue mobility   Time 8   Period Weeks   Status On-going           Long Term Clinic Goals - 05/23/16 1123      CC Long Term Goal  #2   Title Pt. will report swelling is adequately managed to enable ADL function at a consistent level.   Status On-going     CC Long Term Goal  #4   Title Pain/discomfort at right axilla area will be controlled at 6/10 or less.   Status On-going     CC Long Term Goal  #5   Title Patient will avoid infection by ongoing management of her lymphedema at right breast/axilla/upper arm areas.   Status On-going     CC Long Term Goal  #6   Title Pain in right groin/ischial tuberosity area will be controlled at 3/10 level or less to enable walking with less pain.   Status On-going            Plan - 06/02/16 1218    Clinical Impression Statement Patient seems to be doing better today despite reporting significantly increased fullness at right upper outer breast and some increased discomfort in the right groin/thigh area.   Rehab Potential Good   Clinical Impairments Affecting Rehab Potential active cancer   PT Frequency 2x / week   PT Duration 12 weeks   PT Treatment/Interventions ADLs/Self Care Home Management;Therapeutic activities;Therapeutic exercise;Patient/family education;Manual techniques;Manual lymph drainage;Scar mobilization   PT Next Visit Plan  manual lymph drainage, soft tissue work, myofascial release,    Consulted and Agree with Plan of Care Patient      Patient will benefit from skilled therapeutic intervention in order to improve the following deficits and impairments:  Increased edema, Increased fascial restricitons, Pain  Visit Diagnosis: Pain in right thigh  Lymphedema, not elsewhere classified     Problem List Patient Active Problem List   Diagnosis Date Noted  . Liver metastases ([place])  02/23/2016  . 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 Zoster 04/04/2015  . Nausea with vomiting 11/18/2014  . Constipation 11/18/2014  . Left-sided thoracic back pain   . Bone metastases ([place]) 11/16/2014  . Back pain 11/15/2014  . Uncontrolled pain 11/14/2014  . Post-lymphadenectomy lymphedema of arm 05/31/2014  . Chest wall pain 03/21/2014  . Abnormal LFTs (liver function tests) 09/12/2013  . Malignant neoplasm of upper-inner  quadrant of right breast in female, estrogen receptor positive ([place]) 08/18/2013  . 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

## 2016-06-02 NOTE — Telephone Encounter (Signed)
See prior note

## 2016-06-02 NOTE — Telephone Encounter (Signed)
This RN spoke with pt post lab draw today .  Discussed results with very mild increase in transaminases.  Jaelin started cycle of Ibrance 75 mg daily 2/25 and then femara on 2/26.  Charliann states she has developed increased nausea " that primarily is during the night " she wakes up and feels nausea - which keeps her awake.  She declines use of antinausea due to " more side effects then just having the nausea "  Remya would like to have her Cmet rechecked next week due to noted concern with today's lab.  Lastly she is having symptoms with the palpable nodule on her chest- she states Dr Jannifer Rodney stated possible radiation by Dr Lisbeth Renshaw but Antanasia is inquiring if the nodule could be ablated by Dr Pascal Lux.  Cari states nodule is sore and at night she feels chest pressure in area of palpable nodule.  " I am concerned that Dr Lisbeth Renshaw would want to treat the larger nodule in my mediastinum but I cannot endure another burnt esophagus "    Aleza is wanting Dr Pascal Lux to review her PET scan and noted nodule for possible ablation.  " could Dr Jana Hakim send Dr Pascal Lux a note inquiring if ablation would be of benefit "  This note will be sent to MD for review.

## 2016-06-03 ENCOUNTER — Encounter: Payer: Self-pay | Admitting: Physical Therapy

## 2016-06-03 ENCOUNTER — Ambulatory Visit: Payer: 59 | Attending: Radiation Oncology | Admitting: Physical Therapy

## 2016-06-03 DIAGNOSIS — M62838 Other muscle spasm: Secondary | ICD-10-CM | POA: Insufficient documentation

## 2016-06-03 DIAGNOSIS — M79651 Pain in right thigh: Secondary | ICD-10-CM | POA: Insufficient documentation

## 2016-06-03 DIAGNOSIS — R2689 Other abnormalities of gait and mobility: Secondary | ICD-10-CM | POA: Diagnosis present

## 2016-06-03 DIAGNOSIS — M25551 Pain in right hip: Secondary | ICD-10-CM | POA: Diagnosis present

## 2016-06-03 LAB — CANCER ANTIGEN 27.29: CA 27.29: 3416.9 U/mL — ABNORMAL HIGH (ref 0.0–38.6)

## 2016-06-03 NOTE — Therapy (Signed)
[facility] [address], [place], [place], [address] Phone: [phone]   Fax:  [phone]  Physical Therapy Treatment  Patient Details  Name: [name] [name] MRN: [id number] Date of Birth: Feb 27, 1961 Referring Provider: Dr. Kyung Rudd  Encounter Date: 06/03/2016      PT End of Session - 06/03/16 1233    Visit Number 20   Date for PT Re-Evaluation 06/25/16   Authorization - Number of Visits 140   PT Start Time 1230   PT Stop Time 1308   PT Time Calculation (min) 38 min   Activity Tolerance Patient tolerated treatment well   Behavior During Therapy [facility] for tasks assessed/performed      Past Medical History:  Diagnosis Date  . Bone metastases ([place]) dx'd 05/2014  . Breast cancer ([place]) dx'd 2005/2011  . Peripheral vascular disease ([place]) 02/2010   blood clot related to porta cath  . PONV (postoperative nausea and vomiting)   . S/P radiation therapy 07/17/2014 through 08/02/2014    Left mediastinum, left seventh rib 3250 cGy in 13 sessions   . S/P radiation therapy 12/11/2014 through 12/22/2014    Left parietal calvarium 2400 cGy in 8 sessions   . Seizures ([place]) 2010   Isolated incident.    Past Surgical History:  Procedure Laterality Date  . AXILLARY LYMPH NODE DISSECTION  Dec. 2011  . BREAST LUMPECTOMY  2005  . IR GENERIC HISTORICAL  05/21/2016   IR RADIOLOGIST EVAL & MGMT 05/21/2016 Sandi Mariscal, MD [facility] INTERV RAD  . MEDIASTINOTOMY CHAMBERLAIN MCNEIL Left 06/02/2013   Procedure: MEDIASTINOTOMY CHAMBERLAIN MCNEIL;  Surgeon: [name], MD;  Location: [place];  Service: Thoracic;  Laterality: Left;  LEFT ANTERIOR MEDIASTINOTOMY   . PORTACATH PLACEMENT  12/11  . removal portacath      There were no vitals filed for this visit.      Subjective  Assessment - 06/03/16 1231    Subjective urine stream is 25% better.  Still have tightness in right thigh. Patient does not walk long distance due to tightness increasing.   Pertinent History -----   Limitations Writing   How long can you walk comfortably? more she walks patient will have increased pain that stops her from walking further   Diagnostic tests x rays of back show no fractures;    Patient Stated Goals walk 20 minutes with minimal limp   Currently in Pain? Yes   Pain Score 2    Pain Location Groin   Pain Orientation Right   Pain Descriptors / Indicators Tightness   Pain Type Chronic pain   Pain Radiating Towards right upper thigh   Pain Onset More than a month ago   Pain Frequency Constant   Aggravating Factors  walking   Pain Relieving Factors using a cane with walking, not walking as much; therapy                         [facility] Adult PT Treatment/Exercise - 06/03/16 0001      Manual Therapy   Manual Therapy Internal Pelvic Floor;Soft tissue mobilization   Soft tissue mobilization right hip adductors, right hamstring, right quads   Internal Pelvic Floor gentle soft tissue work on introitus but mostly on left because right side was very sensitive                PT Education - 06/03/16 1310    Education provided No  PT Short Term Goals - 02/12/16 1226      PT SHORT TERM GOAL #1   Title pain with walking decreased >/= 25%   Time 4   Period Weeks   Status Achieved           PT Long Term Goals - 06/03/16 1313      PT LONG TERM GOAL #1   Title indpendent with HEP   Time 8   Period Weeks   Status On-going     PT LONG TERM GOAL #2   Title pain with walking decreased >/= 75%   Time 8   Period Weeks   Status On-going  using a cane     PT LONG TERM GOAL #3   Title ability to flex her right hip with right groin pain decreased >/= 50% due to improved tissue mobility   Time 8   Period Weeks   Status Achieved     PT  LONG TERM GOAL #4   Title waking up in middle of night with pain decreased >/=50%   Time 8   Period Weeks   Status Achieved     PT LONG TERM GOAL #5   Title reduction of pain by end of day >/= 50% due to increase tissue mobility   Time 8   Period Weeks   Status On-going           Long Term Clinic Goals - 05/23/16 1123      CC Long Term Goal  #2   Title Pt. will report swelling is adequately managed to enable ADL function at a consistent level.   Status On-going     CC Long Term Goal  #4   Title Pain/discomfort at right axilla area will be controlled at 6/10 or less.   Status On-going     CC Long Term Goal  #5   Title Patient will avoid infection by ongoing management of her lymphedema at right breast/axilla/upper arm areas.   Status On-going     CC Long Term Goal  #6   Title Pain in right groin/ischial tuberosity area will be controlled at 3/10 level or less to enable walking with less pain.   Status On-going            Plan - 06/03/16 1310    Clinical Impression Statement Patient reports his urine stream is 25% better.  Patient has tightness in right hip adductors, right hamstring, right quads.  After therapy tightness went down to a 1/10 and muscle spasm on right hip adductor decreased.  Patient is not able to walk alot due to tightness.  Patient would benefit from physical therapy to reduce tightness and keep her moving.     Rehab Potential Good   Clinical Impairments Affecting Rehab Potential active cancer   PT Frequency 2x / week   PT Duration 8 weeks   PT Treatment/Interventions ADLs/Self Care Home Management;Therapeutic activities;Therapeutic exercise;Patient/family education;Manual techniques;Manual lymph drainage;Scar mobilization   PT Next Visit Plan  soft tissue work, myofascial release,    PT Home Exercise Plan progress as needed   Consulted and Agree with Plan of Care Patient      Patient will benefit from skilled therapeutic intervention in order to  improve the following deficits and impairments:  Increased edema, Increased fascial restricitons, Pain  Visit Diagnosis: Pain in right thigh  Other muscle spasm     Problem List Patient Active Problem List   Diagnosis Date Noted  . Liver metastases ([place]) 02/23/2016  .  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 Zoster 04/04/2015  . Nausea with vomiting 11/18/2014  . Constipation 11/18/2014  . Left-sided thoracic back pain   . Bone metastases ([place]) 11/16/2014  . Back pain 11/15/2014  . Uncontrolled pain 11/14/2014  . Post-lymphadenectomy lymphedema of arm 05/31/2014  . Chest wall pain 03/21/2014  . Abnormal LFTs (liver function tests) 09/12/2013  . Malignant neoplasm of upper-inner quadrant of right breast in female, estrogen receptor positive ([place]) 08/18/2013  . 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 [place], [place], [address] Phone: [phone]   Fax:  [phone]  Name: [name] [name] MRN: [id number] Date of Birth: 1961-03-24

## 2016-06-04 ENCOUNTER — Ambulatory Visit (HOSPITAL_BASED_OUTPATIENT_CLINIC_OR_DEPARTMENT_OTHER): Payer: 59

## 2016-06-04 DIAGNOSIS — Z5111 Encounter for antineoplastic chemotherapy: Secondary | ICD-10-CM

## 2016-06-04 DIAGNOSIS — C50211 Malignant neoplasm of upper-inner quadrant of right female breast: Secondary | ICD-10-CM | POA: Diagnosis not present

## 2016-06-04 DIAGNOSIS — C771 Secondary and unspecified malignant neoplasm of intrathoracic lymph nodes: Secondary | ICD-10-CM

## 2016-06-04 DIAGNOSIS — C7951 Secondary malignant neoplasm of bone: Secondary | ICD-10-CM | POA: Diagnosis not present

## 2016-06-04 MED ORDER — FULVESTRANT 250 MG/5ML IM SOLN
500.0000 mg | Freq: Once | INTRAMUSCULAR | Status: AC
  Administered 2016-06-04: 500 mg via INTRAMUSCULAR
  Filled 2016-06-04: qty 10

## 2016-06-04 MED ORDER — DENOSUMAB 120 MG/1.7ML ~~LOC~~ SOLN
120.0000 mg | Freq: Once | SUBCUTANEOUS | Status: AC
  Administered 2016-06-04: 120 mg via SUBCUTANEOUS
  Filled 2016-06-04: qty 1.7

## 2016-06-04 NOTE — Patient Instructions (Signed)
Denosumab injection What is this medicine? DENOSUMAB (den oh sue mab) slows bone breakdown. Prolia is used to treat osteoporosis in women after menopause and in men. Xgeva is used to prevent bone fractures and other bone problems caused by cancer bone metastases. Xgeva is also used to treat giant cell tumor of the bone. This medicine may be used for other purposes; ask your health care provider or pharmacist if you have questions. What should I tell my health care provider before I take this medicine? They need to know if you have any of these conditions: -dental disease -eczema -infection or history of infections -kidney disease or on dialysis -low blood calcium or vitamin D -malabsorption syndrome -scheduled to have surgery or tooth extraction -taking medicine that contains denosumab -thyroid or parathyroid disease -an unusual reaction to denosumab, other medicines, foods, dyes, or preservatives -pregnant or trying to get pregnant -breast-feeding How should I use this medicine? This medicine is for injection under the skin. It is given by a health care professional in a hospital or clinic setting. If you are getting Prolia, a special MedGuide will be given to you by the pharmacist with each prescription and refill. Be sure to read this information carefully each time. For Prolia, talk to your pediatrician regarding the use of this medicine in children. Special care may be needed. For Xgeva, talk to your pediatrician regarding the use of this medicine in children. While this drug may be prescribed for children as young as 13 years for selected conditions, precautions do apply. Overdosage: If you think you have taken too much of this medicine contact a poison control center or emergency room at once. NOTE: This medicine is only for you. Do not share this medicine with others. What if I miss a dose? It is important not to miss your dose. Call your doctor or health care professional if you are  unable to keep an appointment. What may interact with this medicine? Do not take this medicine with any of the following medications: -other medicines containing denosumab This medicine may also interact with the following medications: -medicines that suppress the immune system -medicines that treat cancer -steroid medicines like prednisone or cortisone This list may not describe all possible interactions. Give your health care provider a list of all the medicines, herbs, non-prescription drugs, or dietary supplements you use. Also tell them if you smoke, drink alcohol, or use illegal drugs. Some items may interact with your medicine. What should I watch for while using this medicine? Visit your doctor or health care professional for regular checks on your progress. Your doctor or health care professional may order blood tests and other tests to see how you are doing. Call your doctor or health care professional if you get a cold or other infection while receiving this medicine. Do not treat yourself. This medicine may decrease your body's ability to fight infection. You should make sure you get enough calcium and vitamin D while you are taking this medicine, unless your doctor tells you not to. Discuss the foods you eat and the vitamins you take with your health care professional. See your dentist regularly. Brush and floss your teeth as directed. Before you have any dental work done, tell your dentist you are receiving this medicine. Do not become pregnant while taking this medicine or for 5 months after stopping it. Women should inform their doctor if they wish to become pregnant or think they might be pregnant. There is a potential for serious side effects   to an unborn child. Talk to your health care professional or pharmacist for more information. What side effects may I notice from receiving this medicine? Side effects that you should report to your doctor or health care professional as soon as  possible: -allergic reactions like skin rash, itching or hives, swelling of the face, lips, or tongue -breathing problems -chest pain -fast, irregular heartbeat -feeling faint or lightheaded, falls -fever, chills, or any other sign of infection -muscle spasms, tightening, or twitches -numbness or tingling -skin blisters or bumps, or is dry, peels, or red -slow healing or unexplained pain in the mouth or jaw -unusual bleeding or bruising Side effects that usually do not require medical attention (Report these to your doctor or health care professional if they continue or are bothersome.): -muscle pain -stomach upset, gas This list may not describe all possible side effects. Call your doctor for medical advice about side effects. You may report side effects to FDA at 1-800-FDA-1088. Where should I keep my medicine? This medicine is only given in a clinic, doctor's office, or other health care setting and will not be stored at home. NOTE: This sheet is a summary. It may not cover all possible information. If you have questions about this medicine, talk to your doctor, pharmacist, or health care provider.    2016, Elsevier/Gold Standard. (2011-09-15 12:37:47) Fulvestrant injection What is this medicine? FULVESTRANT (ful VES trant) blocks the effects of estrogen. It is used to treat breast cancer. This medicine may be used for other purposes; ask your health care provider or pharmacist if you have questions. What should I tell my health care provider before I take this medicine? They need to know if you have any of these conditions: -bleeding problems -liver disease -low levels of platelets in the blood -an unusual or allergic reaction to fulvestrant, other medicines, foods, dyes, or preservatives -pregnant or trying to get pregnant -breast-feeding How should I use this medicine? This medicine is for injection into a muscle. It is usually given by a health care professional in a  hospital or clinic setting. Talk to your pediatrician regarding the use of this medicine in children. Special care may be needed. Overdosage: If you think you have taken too much of this medicine contact a poison control center or emergency room at once. NOTE: This medicine is only for you. Do not share this medicine with others. What if I miss a dose? It is important not to miss your dose. Call your doctor or health care professional if you are unable to keep an appointment. What may interact with this medicine? -medicines that treat or prevent blood clots like warfarin, enoxaparin, and dalteparin This list may not describe all possible interactions. Give your health care provider a list of all the medicines, herbs, non-prescription drugs, or dietary supplements you use. Also tell them if you smoke, drink alcohol, or use illegal drugs. Some items may interact with your medicine. What should I watch for while using this medicine? Your condition will be monitored carefully while you are receiving this medicine. You will need important blood work done while you are taking this medicine. Do not become pregnant while taking this medicine or for at least 1 year after stopping it. Women of child-bearing potential will need to have a negative pregnancy test before starting this medicine. Women should inform their doctor if they wish to become pregnant or think they might be pregnant. There is a potential for serious side effects to an unborn child. Men   should inform their doctors if they wish to father a child. This medicine may lower sperm counts. Talk to your health care professional or pharmacist for more information. Do not breast-feed an infant while taking this medicine or for 1 year after the last dose. What side effects may I notice from receiving this medicine? Side effects that you should report to your doctor or health care professional as soon as possible: -allergic reactions like skin rash,  itching or hives, swelling of the face, lips, or tongue -feeling faint or lightheaded, falls -pain, tingling, numbness, or weakness in the legs -signs and symptoms of infection like fever or chills; cough; flu-like symptoms; sore throat -vaginal bleeding Side effects that usually do not require medical attention (report to your doctor or health care professional if they continue or are bothersome): -aches, pains -constipation -diarrhea -headache -hot flashes -nausea, vomiting -pain at site where injected -stomach pain This list may not describe all possible side effects. Call your doctor for medical advice about side effects. You may report side effects to FDA at 1-800-FDA-1088. Where should I keep my medicine? This drug is given in a hospital or clinic and will not be stored at home. NOTE: This sheet is a summary. It may not cover all possible information. If you have questions about this medicine, talk to your doctor, pharmacist, or health care provider.    2016, Elsevier/Gold Standard. (2014-10-13 11:03:55)  

## 2016-06-06 ENCOUNTER — Ambulatory Visit: Payer: 59 | Admitting: Physical Therapy

## 2016-06-06 DIAGNOSIS — M79651 Pain in right thigh: Secondary | ICD-10-CM

## 2016-06-06 DIAGNOSIS — I890 Lymphedema, not elsewhere classified: Secondary | ICD-10-CM

## 2016-06-06 NOTE — Therapy (Signed)
[place], [place], [address] Phone: [phone]   Fax:  [phone]  Physical Therapy Treatment  Patient Details  Name: LESHAY DESAULNIERS MRN: [id number] Date of Birth: 09-15-1960 Referring Provider: Dr. Kyung Rudd  Encounter Date: 06/06/2016      PT End of Session - 06/06/16 1115    Visit Number 28  20 for lymph   Number of Visits 25  for lymph   Date for PT Re-Evaluation 06/25/16   Authorization - Number of Visits 140   PT Start Time 1027   PT Stop Time 1107   PT Time Calculation (min) 40 min   Activity Tolerance Patient tolerated treatment well   Behavior During Therapy [facility] for tasks assessed/performed      Past Medical History:  Diagnosis Date  . Bone metastases ([place]) dx'd 05/2014  . Breast cancer ([place]) dx'd 2005/2011  . Peripheral vascular disease ([place]) 02/2010   blood clot related to porta cath  . PONV (postoperative nausea and vomiting)   . S/P radiation therapy 07/17/2014 through 08/02/2014    Left mediastinum, left seventh rib 3250 cGy in 13 sessions   . S/P radiation therapy 12/11/2014 through 12/22/2014    Left parietal calvarium 2400 cGy in 8 sessions   . Seizures ([place]) 2010   Isolated incident.    Past Surgical History:  Procedure Laterality Date  . AXILLARY LYMPH NODE DISSECTION  Dec. 2011  . BREAST LUMPECTOMY  2005  . IR GENERIC HISTORICAL  05/21/2016   IR RADIOLOGIST EVAL & MGMT 05/21/2016 Sandi Mariscal, MD [facility] INTERV RAD  . MEDIASTINOTOMY CHAMBERLAIN MCNEIL Left 06/02/2013   Procedure: MEDIASTINOTOMY CHAMBERLAIN MCNEIL;  Surgeon: [name], MD;  Location: [place];  Service: Thoracic;  Laterality: Left;  LEFT ANTERIOR MEDIASTINOTOMY   . PORTACATH PLACEMENT  12/11  . removal portacath      There were no vitals  filed for this visit.      Subjective Assessment - 06/06/16 1028    Currently in Pain? Yes   Pain Score 2    Pain Location Groin   Pain Orientation Right   Pain Descriptors / Indicators Tightness   Pain Score 6   Pain Location Axilla   Pain Orientation Right                         [facility] Adult PT Treatment/Exercise - 06/06/16 0001      Manual Therapy   Myofascial Release right axilla with focus on scar tightness   Manual Lymphatic Drainage (MLD) In left sidelying, posterior interaxillary anastomosis and right axillo-inguinal anastomosis; in supine, short neck, left axilla and anterior interaxillary anastomosis, right groin and axillo-inguinal anastomosis, and right upper extremity from fingers to shoulder.  In right sidelying, left periscapular area toward left groin; extra time spent on the latter today.   Other Manual Therapy soft tissue work at superior aspect of right medial and upper thigh in prone position for pain relief and muscle relaxation, gentle myofascial release at upper thigh, with releases today from far medial to lateral aspects of posterior thigh; extra time on pulling at upper thigh today                  PT Short Term Goals - 02/12/16 1226      PT SHORT TERM GOAL #1   Title pain with walking decreased >/= 25%   Time 4   Period Weeks   Status Achieved  PT Long Term Goals - 06/03/16 1313      PT LONG TERM GOAL #1   Title indpendent with HEP   Time 8   Period Weeks   Status On-going     PT LONG TERM GOAL #2   Title pain with walking decreased >/= 75%   Time 8   Period Weeks   Status On-going  using a cane     PT LONG TERM GOAL #3   Title ability to flex her right hip with right groin pain decreased >/= 50% due to improved tissue mobility   Time 8   Period Weeks   Status Achieved     PT LONG TERM GOAL #4   Title waking up in middle of night with pain decreased >/=50%   Time 8   Period Weeks   Status  Achieved     PT LONG TERM GOAL #5   Title reduction of pain by end of day >/= 50% due to increase tissue mobility   Time 8   Period Weeks   Status On-going           Long Term Clinic Goals - 06/06/16 1120      CC Long Term Goal  #2   Title Pt. will report swelling is adequately managed to enable ADL function at a consistent level.   Baseline We have continued to keep this from becoming more uncomfortable and she has had no infections.   Status On-going     CC Long Term Goal  #4   Title Pain/discomfort at right axilla area will be controlled at 6/10 or less.   Status On-going     CC Long Term Goal  #5   Title Patient will avoid infection by ongoing management of her lymphedema at right breast/axilla/upper arm areas.   Status On-going     CC Long Term Goal  #6   Title Pain in right groin/ischial tuberosity area will be controlled at 3/10 level or less to enable walking with less pain.   Baseline This area pain has stayed at 2-3/10 for some weeks in a row.   Status On-going            Plan - 06/06/16 1117    Clinical Impression Statement Right upper outer breast area discomfort still rated around 6/10 for this patient, but today the area felt softer, less indurated; right hand swelling also appeared decreased.  Right upper thigh felt moderately tight but also mildly inflamed or swollen today.  Therapy continues to allow patient to maintain a somewhat active lifestyle with stage IV cancer.   Rehab Potential Good   Clinical Impairments Affecting Rehab Potential active cancer   PT Frequency 2x / week   PT Duration 12 weeks   PT Treatment/Interventions ADLs/Self Care Home Management;Therapeutic activities;Therapeutic exercise;Patient/family education;Manual techniques;Manual lymph drainage;Scar mobilization   PT Next Visit Plan  soft tissue work, myofascial release, manual lymph drainage   Consulted and Agree with Plan of Care Patient      Patient will benefit from skilled  therapeutic intervention in order to improve the following deficits and impairments:  Increased edema, Increased fascial restricitons, Pain  Visit Diagnosis: Pain in right thigh  Lymphedema, not elsewhere classified     Problem List Patient Active Problem List   Diagnosis Date Noted  . Liver metastases ([place]) 02/23/2016  . 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 Zoster 04/04/2015  . Nausea with vomiting 11/18/2014  . Constipation 11/18/2014  . Left-sided thoracic back  pain   . Bone metastases ([place]) 11/16/2014  . Back pain 11/15/2014  . Uncontrolled pain 11/14/2014  . Post-lymphadenectomy lymphedema of arm 05/31/2014  . Chest wall pain 03/21/2014  . Abnormal LFTs (liver function tests) 09/12/2013  . Malignant neoplasm of upper-inner quadrant of right breast in female, estrogen receptor positive ([place]) 08/18/2013  . 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

## 2016-06-09 ENCOUNTER — Ambulatory Visit: Payer: 59 | Admitting: Physical Therapy

## 2016-06-09 ENCOUNTER — Other Ambulatory Visit: Payer: 59

## 2016-06-09 DIAGNOSIS — M79651 Pain in right thigh: Secondary | ICD-10-CM | POA: Diagnosis not present

## 2016-06-09 DIAGNOSIS — I890 Lymphedema, not elsewhere classified: Secondary | ICD-10-CM

## 2016-06-09 NOTE — Therapy (Signed)
[place], [place], [address] Phone: [phone]   Fax:  [phone]  Physical Therapy Treatment  Patient Details  Name: [name] [name] MRN: [id number] Date of Birth: [birth year] Referring Provider: Dr. Kyung Rudd  Encounter Date: 06/09/2016      PT End of Session - 06/09/16 1116    Visit Number 29  21 for lymph   Number of Visits 25  for lymph   Date for PT Re-Evaluation 06/25/16   Authorization - Number of Visits 140  for 2018   PT Start Time 1016   PT Stop Time 1102   PT Time Calculation (min) 46 min   Activity Tolerance Patient tolerated treatment well   Behavior During Therapy [facility] for tasks assessed/performed      Past Medical History:  Diagnosis Date  . Bone metastases ([place]) dx'd 05/2014  . Breast cancer ([place]) dx'd 2005/2011  . Peripheral vascular disease ([place]) 02/2010   blood clot related to porta cath  . PONV (postoperative nausea and vomiting)   . S/P radiation therapy 07/17/2014 through 08/02/2014    Left mediastinum, left seventh rib 3250 cGy in 13 sessions   . S/P radiation therapy 12/11/2014 through 12/22/2014    Left parietal calvarium 2400 cGy in 8 sessions   . Seizures ([place]) 2010   Isolated incident.    Past Surgical History:  Procedure Laterality Date  . AXILLARY LYMPH NODE DISSECTION  Dec. 2011  . BREAST LUMPECTOMY  2005  . IR GENERIC HISTORICAL  05/21/2016   IR RADIOLOGIST EVAL & MGMT 05/21/2016 Sandi Mariscal, MD [facility] INTERV RAD  . MEDIASTINOTOMY CHAMBERLAIN MCNEIL Left 06/02/2013   Procedure: MEDIASTINOTOMY CHAMBERLAIN MCNEIL;  Surgeon: [name], MD;  Location: [place];  Service: Thoracic;  Laterality: Left;  LEFT ANTERIOR MEDIASTINOTOMY   . PORTACATH PLACEMENT  12/11  . removal portacath      There were  no vitals filed for this visit.      Subjective Assessment - 06/09/16 1018    Subjective "Jim's sick with sinus or a cold or something."  "Weird development:  right foot looke slightly thicker in the shower and it feels slightly number." "I walked a lot on Saturday and by Saturday afternoon I was regretting it."  "I think the Femara is messing with my liver."   Currently in Pain? Yes   Pain Score 3    Pain Location Groin   Pain Orientation Right   Pain Descriptors / Indicators --  tight   Aggravating Factors  walkiing more   Pain Score 6   Pain Location Axilla   Pain Orientation Right                         [facility] Adult PT Treatment/Exercise - 06/09/16 0001      Manual Therapy   Myofascial Release right axilla with focus on scar tightness   Manual Lymphatic Drainage (MLD) In left sidelying, posterior interaxillary anastomosis and right axillo-inguinal anastomosis; in supine, short neck, left axilla and anterior interaxillary anastomosis, right groin and axillo-inguinal anastomosis, and right upper extremity from fingers to shoulder.  In right sidelying, left periscapular area toward left groin; extra time spent on the latter today.   Other Manual Therapy soft tissue work at superior aspect of right medial and upper thigh in prone position for pain relief and muscle relaxation, gentle myofascial release at upper thigh, with releases today from far medial to lateral aspects of  posterior thigh; extra time on pulling at upper thigh today                  PT Short Term Goals - 02/12/16 1226      PT SHORT TERM GOAL #1   Title pain with walking decreased >/= 25%   Time 4   Period Weeks   Status Achieved           PT Long Term Goals - 06/03/16 1313      PT LONG TERM GOAL #1   Title indpendent with HEP   Time 8   Period Weeks   Status On-going     PT LONG TERM GOAL #2   Title pain with walking decreased >/= 75%   Time 8   Period Weeks   Status  On-going  using a cane     PT LONG TERM GOAL #3   Title ability to flex her right hip with right groin pain decreased >/= 50% due to improved tissue mobility   Time 8   Period Weeks   Status Achieved     PT LONG TERM GOAL #4   Title waking up in middle of night with pain decreased >/=50%   Time 8   Period Weeks   Status Achieved     PT LONG TERM GOAL #5   Title reduction of pain by end of day >/= 50% due to increase tissue mobility   Time 8   Period Weeks   Status On-going           Long Term Clinic Goals - 06/06/16 1120      CC Long Term Goal  #2   Title Pt. will report swelling is adequately managed to enable ADL function at a consistent level.   Baseline We have continued to keep this from becoming more uncomfortable and she has had no infections.   Status On-going     CC Long Term Goal  #4   Title Pain/discomfort at right axilla area will be controlled at 6/10 or less.   Status On-going     CC Long Term Goal  #5   Title Patient will avoid infection by ongoing management of her lymphedema at right breast/axilla/upper arm areas.   Status On-going     CC Long Term Goal  #6   Title Pain in right groin/ischial tuberosity area will be controlled at 3/10 level or less to enable walking with less pain.   Baseline This area pain has stayed at 2-3/10 for some weeks in a row.   Status On-going            Plan - 06/09/16 1116    Clinical Impression Statement Both areas, at upper outer right breast and at right groin, appear less aggravated by palpation today.     Rehab Potential Good   Clinical Impairments Affecting Rehab Potential active cancer   PT Frequency 2x / week   PT Duration 12 weeks   PT Treatment/Interventions ADLs/Self Care Home Management;Therapeutic activities;Therapeutic exercise;Patient/family education;Manual techniques;Manual lymph drainage;Scar mobilization   PT Next Visit Plan  soft tissue work, myofascial release, manual lymph drainage    Consulted and Agree with Plan of Care Patient      Patient will benefit from skilled therapeutic intervention in order to improve the following deficits and impairments:  Increased edema, Increased fascial restricitons, Pain  Visit Diagnosis: Lymphedema, not elsewhere classified  Pain in right thigh     Problem List Patient Active Problem List  Diagnosis Date Noted  . Liver metastases ([place]) 02/23/2016  . 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 Zoster 04/04/2015  . Nausea with vomiting 11/18/2014  . Constipation 11/18/2014  . Left-sided thoracic back pain   . Bone metastases ([place]) 11/16/2014  . Back pain 11/15/2014  . Uncontrolled pain 11/14/2014  . Post-lymphadenectomy lymphedema of arm 05/31/2014  . Chest wall pain 03/21/2014  . Abnormal LFTs (liver function tests) 09/12/2013  . Malignant neoplasm of upper-inner quadrant of right breast in female, estrogen receptor positive ([place]) 08/18/2013  . 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

## 2016-06-10 ENCOUNTER — Ambulatory Visit: Payer: 59 | Admitting: Physical Therapy

## 2016-06-10 ENCOUNTER — Encounter: Payer: Self-pay | Admitting: Physical Therapy

## 2016-06-10 DIAGNOSIS — M79651 Pain in right thigh: Secondary | ICD-10-CM | POA: Diagnosis not present

## 2016-06-10 DIAGNOSIS — M25551 Pain in right hip: Secondary | ICD-10-CM

## 2016-06-10 DIAGNOSIS — M62838 Other muscle spasm: Secondary | ICD-10-CM

## 2016-06-10 NOTE — Therapy (Signed)
[facility] [address], [place], [place], [address] Phone: [phone]   Fax:  [phone]  Physical Therapy Treatment  Patient Details  Name: [name] [name] MRN: [id number] Date of Birth: 1960/04/20 Referring Provider: Dr. Kyung Rudd  Encounter Date: 06/10/2016      PT End of Session - 06/10/16 1310    Visit Number 30  pelvis   Date for PT Re-Evaluation 06/25/16  pelvis   Authorization - Visit Number 47   Authorization - Number of Visits 140   PT Start Time 3716   PT Stop Time 1308   PT Time Calculation (min) 38 min   Activity Tolerance Patient tolerated treatment well   Behavior During Therapy [facility] for tasks assessed/performed      Past Medical History:  Diagnosis Date  . Bone metastases ([place]) dx'd 05/2014  . Breast cancer ([place]) dx'd 2005/2011  . Peripheral vascular disease ([place]) 02/2010   blood clot related to porta cath  . PONV (postoperative nausea and vomiting)   . S/P radiation therapy 07/17/2014 through 08/02/2014    Left mediastinum, left seventh rib 3250 cGy in 13 sessions   . S/P radiation therapy 12/11/2014 through 12/22/2014    Left parietal calvarium 2400 cGy in 8 sessions   . Seizures ([place]) 2010   Isolated incident.    Past Surgical History:  Procedure Laterality Date  . AXILLARY LYMPH NODE DISSECTION  Dec. 2011  . BREAST LUMPECTOMY  2005  . IR GENERIC HISTORICAL  05/21/2016   IR RADIOLOGIST EVAL & MGMT 05/21/2016 Sandi Mariscal, MD [facility] INTERV RAD  . MEDIASTINOTOMY CHAMBERLAIN MCNEIL Left 06/02/2013   Procedure: MEDIASTINOTOMY CHAMBERLAIN MCNEIL;  Surgeon: [name], MD;  Location: [place];  Service: Thoracic;  Laterality: Left;  LEFT ANTERIOR MEDIASTINOTOMY   . PORTACATH PLACEMENT  12/11  . removal portacath      There were  no vitals filed for this visit.      Subjective Assessment - 06/10/16 1238    Subjective I am doing alright.  When I urinate I feel the stream is a little bit off.    Patient Stated Goals walk 20 minutes with minimal limp   Currently in Pain? Yes   Pain Score 3    Pain Location Groin   Pain Orientation Right   Pain Descriptors / Indicators Tightness   Pain Type Chronic pain   Pain Radiating Towards right upper thigh   Pain Onset More than a month ago   Pain Frequency Constant   Aggravating Factors  walking more   Pain Relieving Factors using a cane with walking, not walking as much, therapy   Effect of Pain on Daily Activities walking                      Pelvic Floor Special Questions - 06/10/16 0001    Pelvic Floor Internal Exam Patient approves physical therapist to perform pelvic floor muscle assessment   Exam Type Vaginal           [facility] Adult PT Treatment/Exercise - 06/10/16 0001      Manual Therapy   Manual Therapy Internal Pelvic Floor;Soft tissue mobilization   Soft tissue mobilization right hip adductors, right hamstring, right quads   Internal Pelvic Floor gentle soft tissue work to bil. sides of urethra and post. introitus monitoring for pain                PT Education - 06/10/16 1310  Education provided No          PT Short Term Goals - 02/12/16 1226      PT SHORT TERM GOAL #1   Title pain with walking decreased >/= 25%   Time 4   Period Weeks   Status Achieved           PT Long Term Goals - 06/10/16 1317      PT LONG TERM GOAL #1   Title indpendent with HEP   Time 8   Period Weeks   Status On-going     PT LONG TERM GOAL #2   Title pain with walking decreased >/= 75%   Time 8   Period Weeks   Status On-going  uses a cane and only walks short distances     PT LONG TERM GOAL #3   Title ability to flex her right hip with right groin pain decreased >/= 50% due to improved tissue mobility   Time 8   Period  Weeks   Status Achieved     PT LONG TERM GOAL #4   Title waking up in middle of night with pain decreased >/=50%   Time 8   Period Weeks   Status Achieved     PT LONG TERM GOAL #5   Title reduction of pain by end of day >/= 50% due to increase tissue mobility   Time 8   Period Weeks   Status On-going           Long Term Clinic Goals - 06/06/16 1120      CC Long Term Goal  #2   Title Pt. will report swelling is adequately managed to enable ADL function at a consistent level.   Baseline We have continued to keep this from becoming more uncomfortable and she has had no infections.   Status On-going     CC Long Term Goal  #4   Title Pain/discomfort at right axilla area will be controlled at 6/10 or less.   Status On-going     CC Long Term Goal  #5   Title Patient will avoid infection by ongoing management of her lymphedema at right breast/axilla/upper arm areas.   Status On-going     CC Long Term Goal  #6   Title Pain in right groin/ischial tuberosity area will be controlled at 3/10 level or less to enable walking with less pain.   Baseline This area pain has stayed at 2-3/10 for some weeks in a row.   Status On-going            Plan - 06/10/16 1313    Clinical Impression Statement Patient was able to tolerate soft tissue work on the post. introitus and bil. sides of urethra.  Tissue was tight on bil. sides of urethra.  After therapy the urine stream was 50% better.  Patient had improve tissue mobility of right thigh.  Patient will benefit from skilled therapy to improve tissue tightness to keep patient walking for exercise short distances.    Rehab Potential Good   Clinical Impairments Affecting Rehab Potential active cancer   PT Frequency 2x / week   PT Duration 8 weeks   PT Treatment/Interventions ADLs/Self Care Home Management;Therapeutic activities;Therapeutic exercise;Patient/family education;Manual techniques;Manual lymph drainage;Scar mobilization   PT Next  Visit Plan  soft tissue work, myofascial release, manual lymph drainage   PT Home Exercise Plan progress as needed   Consulted and Agree with Plan of Care Patient      Patient  will benefit from skilled therapeutic intervention in order to improve the following deficits and impairments:  Increased edema, Increased fascial restricitons, Pain  Visit Diagnosis: Pain in right thigh  Other muscle spasm  Pain in right hip     Problem List Patient Active Problem List   Diagnosis Date Noted  . Liver metastases ([place]) 02/23/2016  . 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 Zoster 04/04/2015  . Nausea with vomiting 11/18/2014  . Constipation 11/18/2014  . Left-sided thoracic back pain   . Bone metastases ([place]) 11/16/2014  . Back pain 11/15/2014  . Uncontrolled pain 11/14/2014  . Post-lymphadenectomy lymphedema of arm 05/31/2014  . Chest wall pain 03/21/2014  . Abnormal LFTs (liver function tests) 09/12/2013  . Malignant neoplasm of upper-inner quadrant of right breast in female, estrogen receptor positive ([place]) 08/18/2013  . 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 [place], [place], [address] Phone: [phone]   Fax:  [phone]  Name: AVANGELINA FLIGHT MRN: [id number] Date of Birth: October 05, 1960

## 2016-06-11 ENCOUNTER — Other Ambulatory Visit (HOSPITAL_BASED_OUTPATIENT_CLINIC_OR_DEPARTMENT_OTHER): Payer: 59

## 2016-06-11 DIAGNOSIS — C7951 Secondary malignant neoplasm of bone: Secondary | ICD-10-CM | POA: Diagnosis not present

## 2016-06-11 DIAGNOSIS — Z170 Estrogen receptor positive status [ER+]: Secondary | ICD-10-CM

## 2016-06-11 DIAGNOSIS — C771 Secondary and unspecified malignant neoplasm of intrathoracic lymph nodes: Secondary | ICD-10-CM

## 2016-06-11 DIAGNOSIS — C50211 Malignant neoplasm of upper-inner quadrant of right female breast: Secondary | ICD-10-CM | POA: Diagnosis not present

## 2016-06-11 DIAGNOSIS — C787 Secondary malignant neoplasm of liver and intrahepatic bile duct: Secondary | ICD-10-CM

## 2016-06-11 LAB — COMPREHENSIVE METABOLIC PANEL
ALT: 51 U/L (ref 0–55)
AST: 41 U/L — ABNORMAL HIGH (ref 5–34)
Albumin: 3.9 g/dL (ref 3.5–5.0)
Alkaline Phosphatase: 98 U/L (ref 40–150)
Anion Gap: 8 mEq/L (ref 3–11)
BUN: 10 mg/dL (ref 7.0–26.0)
CHLORIDE: 102 meq/L (ref 98–109)
CO2: 26 meq/L (ref 22–29)
Calcium: 9.6 mg/dL (ref 8.4–10.4)
Creatinine: 0.8 mg/dL (ref 0.6–1.1)
EGFR: 82 mL/min/{1.73_m2} — AB (ref >90)
GLUCOSE: 69 mg/dL — AB (ref 70–140)
POTASSIUM: 4.9 meq/L (ref 3.5–5.1)
Sodium: 136 mEq/L (ref 136–145)
Total Bilirubin: 0.39 mg/dL (ref 0.20–1.20)
Total Protein: 7.2 g/dL (ref 6.4–8.3)

## 2016-06-11 LAB — CBC WITH DIFFERENTIAL/PLATELET
BASO%: 1.7 % (ref 0.0–2.0)
BASOS ABS: 0 10*3/uL (ref 0.0–0.1)
EOS%: 2.2 % (ref 0.0–7.0)
Eosinophils Absolute: 0.1 10*3/uL (ref 0.0–0.5)
HCT: 31.9 % — ABNORMAL LOW (ref 34.8–46.6)
HGB: 11.1 g/dL — ABNORMAL LOW (ref 11.6–15.9)
LYMPH#: 0.4 10*3/uL — AB (ref 0.9–3.3)
LYMPH%: 15.8 % (ref 14.0–49.7)
MCH: 38.5 pg — AB (ref 25.1–34.0)
MCHC: 34.9 g/dL (ref 31.5–36.0)
MCV: 110.4 fL — AB (ref 79.5–101.0)
MONO#: 0.2 10*3/uL (ref 0.1–0.9)
MONO%: 7.8 % (ref 0.0–14.0)
NEUT#: 1.9 10*3/uL (ref 1.5–6.5)
NEUT%: 72.5 % (ref 38.4–76.8)
Platelets: 213 10*3/uL (ref 145–400)
RBC: 2.89 10*6/uL — AB (ref 3.70–5.45)
RDW: 14.3 % (ref 11.2–14.5)
WBC: 2.6 10*3/uL — ABNORMAL LOW (ref 3.9–10.3)

## 2016-06-12 NOTE — Telephone Encounter (Signed)
error 

## 2016-06-13 ENCOUNTER — Ambulatory Visit: Payer: 59 | Admitting: Physical Therapy

## 2016-06-13 DIAGNOSIS — M79651 Pain in right thigh: Secondary | ICD-10-CM | POA: Diagnosis not present

## 2016-06-13 DIAGNOSIS — I890 Lymphedema, not elsewhere classified: Secondary | ICD-10-CM

## 2016-06-13 NOTE — Therapy (Signed)
[place], [place], [address] Phone: [phone]   Fax:  [phone]  Physical Therapy Treatment  Patient Details  Name: [name] [name] MRN: [id number] Date of Birth: January 01, 1961 Referring Provider: Dr. Kyung Rudd  Encounter Date: 06/13/2016      PT End of Session - 06/13/16 1113    Visit Number 69  62 for lymph   Number of Visits 25  for lymph   Date for PT Re-Evaluation 06/25/16   Authorization - Number of Visits 140  for 2018   PT Start Time 1022   PT Stop Time 1106   PT Time Calculation (min) 44 min   Activity Tolerance Patient tolerated treatment well   Behavior During Therapy [facility] for tasks assessed/performed      Past Medical History:  Diagnosis Date  . Bone metastases ([place]) dx'd 05/2014  . Breast cancer ([place]) dx'd 2005/2011  . Peripheral vascular disease ([place]) 02/2010   blood clot related to porta cath  . PONV (postoperative nausea and vomiting)   . S/P radiation therapy 07/17/2014 through 08/02/2014    Left mediastinum, left seventh rib 3250 cGy in 13 sessions   . S/P radiation therapy 12/11/2014 through 12/22/2014    Left parietal calvarium 2400 cGy in 8 sessions   . Seizures ([place]) 2010   Isolated incident.    Past Surgical History:  Procedure Laterality Date  . AXILLARY LYMPH NODE DISSECTION  Dec. 2011  . BREAST LUMPECTOMY  2005  . IR GENERIC HISTORICAL  05/21/2016   IR RADIOLOGIST EVAL & MGMT 05/21/2016 Sandi Mariscal, MD [facility] INTERV RAD  . MEDIASTINOTOMY CHAMBERLAIN MCNEIL Left 06/02/2013   Procedure: MEDIASTINOTOMY CHAMBERLAIN MCNEIL;  Surgeon: [name], MD;  Location: [place];  Service: Thoracic;  Laterality: Left;  LEFT ANTERIOR MEDIASTINOTOMY   . PORTACATH PLACEMENT  12/11  . removal portacath      There were  no vitals filed for this visit.      Subjective Assessment - 06/13/16 1024    Currently in Pain? Yes   Pain Score 3    Pain Location Groin   Pain Orientation Right   Pain Descriptors / Indicators --  tightness   Pain Score 6   Pain Location Axilla   Pain Orientation Right   Pain Descriptors / Indicators --  full                         [facility] Adult PT Treatment/Exercise - 06/13/16 0001      Manual Therapy   Myofascial Release right axilla with focus on scar tightness   Manual Lymphatic Drainage (MLD) In left sidelying, posterior interaxillary anastomosis and right axillo-inguinal anastomosis; in supine, short neck, left axilla and anterior interaxillary anastomosis, right groin and axillo-inguinal anastomosis, and right upper extremity from fingers to shoulder.  In right sidelying, left periscapular area toward left groin; extra time spent on the latter today.   Other Manual Therapy soft tissue work at superior aspect of right medial and upper thigh in prone position for pain relief and muscle relaxation, gentle myofascial release at upper thigh, with releases today from far medial to lateral aspects of posterior thigh; extra time on pulling at upper thigh today                  PT Short Term Goals - 02/12/16 1226      PT SHORT TERM GOAL #1   Title pain with walking decreased >/=  25%   Time 4   Period Weeks   Status Achieved           PT Long Term Goals - 06/10/16 1317      PT LONG TERM GOAL #1   Title indpendent with HEP   Time 8   Period Weeks   Status On-going     PT LONG TERM GOAL #2   Title pain with walking decreased >/= 75%   Time 8   Period Weeks   Status On-going  uses a cane and only walks short distances     PT LONG TERM GOAL #3   Title ability to flex her right hip with right groin pain decreased >/= 50% due to improved tissue mobility   Time 8   Period Weeks   Status Achieved     PT LONG TERM GOAL #4   Title waking  up in middle of night with pain decreased >/=50%   Time 8   Period Weeks   Status Achieved     PT LONG TERM GOAL #5   Title reduction of pain by end of day >/= 50% due to increase tissue mobility   Time 8   Period Weeks   Status On-going           Long Term Clinic Goals - 06/06/16 1120      CC Long Term Goal  #2   Title Pt. will report swelling is adequately managed to enable ADL function at a consistent level.   Baseline We have continued to keep this from becoming more uncomfortable and she has had no infections.   Status On-going     CC Long Term Goal  #4   Title Pain/discomfort at right axilla area will be controlled at 6/10 or less.   Status On-going     CC Long Term Goal  #5   Title Patient will avoid infection by ongoing management of her lymphedema at right breast/axilla/upper arm areas.   Status On-going     CC Long Term Goal  #6   Title Pain in right groin/ischial tuberosity area will be controlled at 3/10 level or less to enable walking with less pain.   Baseline This area pain has stayed at 2-3/10 for some weeks in a row.   Status On-going            Plan - 06/13/16 1115    Clinical Impression Statement Pt. with a flare-up of right groin/thigh pain today after being somewhat active on her feet earlier this morning.  Right upper outer posterior thigh was tighter than usual today.  Pt. continues to benefit from soft tissue work, myofascial release, and manual lymph drainage for partial relief of discomfort.   Rehab Potential Good   Clinical Impairments Affecting Rehab Potential active cancer   PT Frequency 2x / week   PT Duration 12 weeks   PT Treatment/Interventions ADLs/Self Care Home Management;Therapeutic activities;Therapeutic exercise;Patient/family education;Manual techniques;Manual lymph drainage;Scar mobilization   PT Next Visit Plan  soft tissue work, myofascial release, manual lymph drainage   Consulted and Agree with Plan of Care Patient       Patient will benefit from skilled therapeutic intervention in order to improve the following deficits and impairments:  Increased edema, Increased fascial restricitons, Pain  Visit Diagnosis: Pain in right thigh  Lymphedema, not elsewhere classified     Problem List Patient Active Problem List   Diagnosis Date Noted  . Liver metastases ([place]) 02/23/2016  . Malignant pleural effusion,  left 04/09/2015  . Zoster 04/04/2015  . Nausea with vomiting 11/18/2014  . Constipation 11/18/2014  . Left-sided thoracic back pain   . Bone metastases ([place]) 11/16/2014  . Back pain 11/15/2014  . Uncontrolled pain 11/14/2014  . Post-lymphadenectomy lymphedema of arm 05/31/2014  . Chest wall pain 03/21/2014  . Abnormal LFTs (liver function tests) 09/12/2013  . Malignant neoplasm of upper-inner quadrant of right breast in female, estrogen receptor positive ([place]) 08/18/2013  . 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

## 2016-06-16 ENCOUNTER — Ambulatory Visit: Payer: 59 | Admitting: Physical Therapy

## 2016-06-16 ENCOUNTER — Other Ambulatory Visit (HOSPITAL_BASED_OUTPATIENT_CLINIC_OR_DEPARTMENT_OTHER): Payer: 59

## 2016-06-16 DIAGNOSIS — C50211 Malignant neoplasm of upper-inner quadrant of right female breast: Secondary | ICD-10-CM | POA: Diagnosis not present

## 2016-06-16 DIAGNOSIS — M79651 Pain in right thigh: Secondary | ICD-10-CM | POA: Diagnosis not present

## 2016-06-16 DIAGNOSIS — C787 Secondary malignant neoplasm of liver and intrahepatic bile duct: Secondary | ICD-10-CM

## 2016-06-16 DIAGNOSIS — Z170 Estrogen receptor positive status [ER+]: Secondary | ICD-10-CM

## 2016-06-16 DIAGNOSIS — I890 Lymphedema, not elsewhere classified: Secondary | ICD-10-CM

## 2016-06-16 DIAGNOSIS — C7951 Secondary malignant neoplasm of bone: Secondary | ICD-10-CM

## 2016-06-16 DIAGNOSIS — C771 Secondary and unspecified malignant neoplasm of intrathoracic lymph nodes: Secondary | ICD-10-CM

## 2016-06-16 LAB — CBC WITH DIFFERENTIAL/PLATELET
BASO%: 2.9 % — ABNORMAL HIGH (ref 0.0–2.0)
Basophils Absolute: 0.1 10*3/uL (ref 0.0–0.1)
EOS ABS: 0.1 10*3/uL (ref 0.0–0.5)
EOS%: 1.8 % (ref 0.0–7.0)
HCT: 30.4 % — ABNORMAL LOW (ref 34.8–46.6)
HEMOGLOBIN: 10.8 g/dL — AB (ref 11.6–15.9)
LYMPH#: 0.5 10*3/uL — AB (ref 0.9–3.3)
LYMPH%: 18.4 % (ref 14.0–49.7)
MCH: 37.5 pg — ABNORMAL HIGH (ref 25.1–34.0)
MCHC: 35.5 g/dL (ref 31.5–36.0)
MCV: 105.6 fL — ABNORMAL HIGH (ref 79.5–101.0)
MONO#: 0.2 10*3/uL (ref 0.1–0.9)
MONO%: 8.7 % (ref 0.0–14.0)
NEUT%: 68.2 % (ref 38.4–76.8)
NEUTROS ABS: 1.9 10*3/uL (ref 1.5–6.5)
NRBC: 0 % (ref 0–0)
PLATELETS: 213 10*3/uL (ref 145–400)
RBC: 2.88 10*6/uL — AB (ref 3.70–5.45)
RDW: 13.3 % (ref 11.2–14.5)
WBC: 2.8 10*3/uL — AB (ref 3.9–10.3)

## 2016-06-16 NOTE — Therapy (Signed)
[place], [place], [address] Phone: [phone]   Fax:  [phone]  Physical Therapy Treatment  Patient Details  Name: [name] [name] MRN: [id number] Date of Birth: 10-14-1960 Referring Provider: Dr. Kyung Rudd  Encounter Date: 06/16/2016      PT End of Session - 06/16/16 1259    Visit Number 71  23 for lymph   Number of Visits 25  for lymph   Date for PT Re-Evaluation 06/25/16   Authorization - Number of Visits 140  for 2018   PT Start Time 1019   PT Stop Time 1100   PT Time Calculation (min) 41 min   Activity Tolerance Patient tolerated treatment well   Behavior During Therapy [facility] for tasks assessed/performed      Past Medical History:  Diagnosis Date  . Bone metastases ([place]) dx'd 05/2014  . Breast cancer ([place]) dx'd 2005/2011  . Peripheral vascular disease ([place]) 02/2010   blood clot related to porta cath  . PONV (postoperative nausea and vomiting)   . S/P radiation therapy 07/17/2014 through 08/02/2014    Left mediastinum, left seventh rib 3250 cGy in 13 sessions   . S/P radiation therapy 12/11/2014 through 12/22/2014    Left parietal calvarium 2400 cGy in 8 sessions   . Seizures ([place]) 2010   Isolated incident.    Past Surgical History:  Procedure Laterality Date  . AXILLARY LYMPH NODE DISSECTION  Dec. 2011  . BREAST LUMPECTOMY  2005  . IR GENERIC HISTORICAL  05/21/2016   IR RADIOLOGIST EVAL & MGMT 05/21/2016 Sandi Mariscal, MD [facility] INTERV RAD  . MEDIASTINOTOMY CHAMBERLAIN MCNEIL Left 06/02/2013   Procedure: MEDIASTINOTOMY CHAMBERLAIN MCNEIL;  Surgeon: [name], MD;  Location: [place];  Service: Thoracic;  Laterality: Left;  LEFT ANTERIOR MEDIASTINOTOMY   . PORTACATH PLACEMENT  12/11  . removal portacath      There were  no vitals filed for this visit.      Subjective Assessment - 06/16/16 1020    Currently in Pain? (P)  Yes   Pain Score (P)  2   2-3   Pain Location (P)  Groin   Pain Orientation (P)  Right   Pain Descriptors / Indicators (P)  Tightness   Pain Type (P)  Chronic pain   Aggravating Factors  (P)  walking more   Pain Score (P)  6   Pain Location (P)  Axilla   Pain Orientation (P)  Right   Pain Descriptors / Indicators (P)  --  full   Pain Type (P)  Chronic pain   Pain Onset (P)  More than a month ago   Aggravating Factors  (P)  more swelling                         [facility] Adult PT Treatment/Exercise - 06/16/16 0001      Manual Therapy   Myofascial Release right axilla with focus on scar tightness   Manual Lymphatic Drainage (MLD) In left sidelying, posterior interaxillary anastomosis and right axillo-inguinal anastomosis; in supine, short neck, left axilla and anterior interaxillary anastomosis, right groin and axillo-inguinal anastomosis, and right upper extremity from fingers to shoulder.  In right sidelying, left periscapular area toward left groin   Other Manual Therapy soft tissue work at superior aspect of right medial and upper thigh in prone position for pain relief and muscle relaxation, gentle myofascial release at upper thigh, with releases today from far  medial to lateral aspects of posterior thigh                  PT Short Term Goals - 02/12/16 1226      PT SHORT TERM GOAL #1   Title pain with walking decreased >/= 25%   Time 4   Period Weeks   Status Achieved           PT Long Term Goals - 06/10/16 1317      PT LONG TERM GOAL #1   Title indpendent with HEP   Time 8   Period Weeks   Status On-going     PT LONG TERM GOAL #2   Title pain with walking decreased >/= 75%   Time 8   Period Weeks   Status On-going  uses a cane and only walks short distances     PT LONG TERM GOAL #3   Title ability to flex her right hip with right  groin pain decreased >/= 50% due to improved tissue mobility   Time 8   Period Weeks   Status Achieved     PT LONG TERM GOAL #4   Title waking up in middle of night with pain decreased >/=50%   Time 8   Period Weeks   Status Achieved     PT LONG TERM GOAL #5   Title reduction of pain by end of day >/= 50% due to increase tissue mobility   Time 8   Period Weeks   Status On-going           Long Term Clinic Goals - 06/06/16 1120      CC Long Term Goal  #2   Title Pt. will report swelling is adequately managed to enable ADL function at a consistent level.   Baseline We have continued to keep this from becoming more uncomfortable and she has had no infections.   Status On-going     CC Long Term Goal  #4   Title Pain/discomfort at right axilla area will be controlled at 6/10 or less.   Status On-going     CC Long Term Goal  #5   Title Patient will avoid infection by ongoing management of her lymphedema at right breast/axilla/upper arm areas.   Status On-going     CC Long Term Goal  #6   Title Pain in right groin/ischial tuberosity area will be controlled at 3/10 level or less to enable walking with less pain.   Baseline This area pain has stayed at 2-3/10 for some weeks in a row.   Status On-going            Plan - 06/16/16 1259    Clinical Impression Statement Seems to have some ups and downs with pain levels and with lymphedema, but overall, is being maintained well.  Right posterior upper outer thigh again tighter than usual today.   Rehab Potential Good   Clinical Impairments Affecting Rehab Potential active cancer   PT Frequency 2x / week   PT Duration 12 weeks   PT Treatment/Interventions ADLs/Self Care Home Management;Therapeutic activities;Therapeutic exercise;Patient/family education;Manual techniques;Manual lymph drainage;Scar mobilization   PT Next Visit Plan  soft tissue work, myofascial release, manual lymph drainage; consider teaching patient manual  lymph drainage   Consulted and Agree with Plan of Care Patient      Patient will benefit from skilled therapeutic intervention in order to improve the following deficits and impairments:  Increased edema, Increased fascial restricitons, Pain  Visit Diagnosis:  Pain in right thigh  Lymphedema, not elsewhere classified     Problem List Patient Active Problem List   Diagnosis Date Noted  . Liver metastases ([place]) 02/23/2016  . 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 Zoster 04/04/2015  . Nausea with vomiting 11/18/2014  . Constipation 11/18/2014  . Left-sided thoracic back pain   . Bone metastases ([place]) 11/16/2014  . Back pain 11/15/2014  . Uncontrolled pain 11/14/2014  . Post-lymphadenectomy lymphedema of arm 05/31/2014  . Chest wall pain 03/21/2014  . Abnormal LFTs (liver function tests) 09/12/2013  . Malignant neoplasm of upper-inner quadrant of right breast in female, estrogen receptor positive ([place]) 08/18/2013  . 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

## 2016-06-17 ENCOUNTER — Encounter: Payer: Self-pay | Admitting: Physical Therapy

## 2016-06-17 ENCOUNTER — Ambulatory Visit: Payer: 59 | Admitting: Physical Therapy

## 2016-06-17 DIAGNOSIS — M79651 Pain in right thigh: Secondary | ICD-10-CM

## 2016-06-17 DIAGNOSIS — M62838 Other muscle spasm: Secondary | ICD-10-CM

## 2016-06-17 NOTE — Patient Instructions (Signed)
About Abdominal Massage  Abdominal massage, also called external colon massage, is a self-treatment circular massage technique that can reduce and eliminate gas and ease constipation. The colon naturally contracts in waves in a clockwise direction starting from inside the right hip, moving up toward the ribs, across the belly, and down inside the left hip.  When you perform circular abdominal massage, you help stimulate your colon's normal wave pattern of movement called peristalsis.  It is most beneficial when done after eating.  Positioning You can practice abdominal massage with oil while lying down, or in the shower with soap.  Some people find that it is just as effective to do the massage through clothing while sitting or standing.  How to Massage Start by placing your finger tips or knuckles on your right side, just inside your hip bone.  . Make small circular movements while you move upward toward your rib cage.   . Once you reach the bottom right side of your rib cage, take your circular movements across to the left side of the bottom of your rib cage.  . Next, move downward until you reach the inside of your left hip bone.  This is the path your feces travel in your colon. . Continue to perform your abdominal massage in this pattern for 10 minutes each day.     You can apply as much pressure as is comfortable in your massage.  Start gently and build pressure as you continue to practice.  Notice any areas of pain as you massage; areas of slight pain may be relieved as you massage, but if you have areas of significant or intense pain, consult with your healthcare provider.  Other Considerations . General physical activity including bending and stretching can have a beneficial massage-like effect on the colon.  Deep breathing can also stimulate the colon because breathing deeply activates the same nervous system that supplies the colon.   . Abdominal massage should always be used in  combination with a bowel-conscious diet that is high in the proper type of fiber for you, fluids (primarily water), and a regular exercise program. [facility] [address], [address] [place], [place] [address] Phone # [phone] Fax [phone]  

## 2016-06-17 NOTE — Therapy (Signed)
[facility] [address] [place], [place], [address] Phone: [phone]   Fax:  [phone]  Physical Therapy Treatment  Patient Details  Name: [name] [name] MRN: [id number] Date of Birth: Oct 21, 1960 Referring Provider: Dr. Kyung Rudd  Encounter Date: 06/17/2016      PT End of Session - 06/17/16 1310    Visit Number 60   Date for PT Re-Evaluation 06/25/16   Authorization Type April cert for lymphedema therapy done   Authorization - Visit Number 33   Authorization - Number of Visits 140   PT Start Time 1215   PT Stop Time 1300   PT Time Calculation (min) 45 min   Activity Tolerance Patient tolerated treatment well   Behavior During Therapy [facility] for tasks assessed/performed      Past Medical History:  Diagnosis Date  . Bone metastases ([place]) dx'd 05/2014  . Breast cancer ([place]) dx'd 2005/2011  . Peripheral vascular disease ([place]) 02/2010   blood clot related to porta cath  . PONV (postoperative nausea and vomiting)   . S/P radiation therapy 07/17/2014 through 08/02/2014    Left mediastinum, left seventh rib 3250 cGy in 13 sessions   . S/P radiation therapy 12/11/2014 through 12/22/2014    Left parietal calvarium 2400 cGy in 8 sessions   . Seizures ([place]) 2010   Isolated incident.    Past Surgical History:  Procedure Laterality Date  . AXILLARY LYMPH NODE DISSECTION  Dec. 2011  . BREAST LUMPECTOMY  2005  . IR GENERIC HISTORICAL  05/21/2016   IR RADIOLOGIST EVAL & MGMT 05/21/2016 Sandi Mariscal, MD [facility] INTERV RAD  . MEDIASTINOTOMY CHAMBERLAIN MCNEIL Left 06/02/2013   Procedure: MEDIASTINOTOMY CHAMBERLAIN MCNEIL;  Surgeon: [name], MD;  Location: [place];  Service: Thoracic;  Laterality: Left;  LEFT ANTERIOR MEDIASTINOTOMY   . PORTACATH PLACEMENT   12/11  . removal portacath      There were no vitals filed for this visit.      Subjective Assessment - 06/17/16 1220    Subjective I am having bad constipation due to the medicines I am taking.  I have bleed after my bowel movement so I am cut.  It felt like glass. I am having trouble with my urine stream.  I am very tight.    How long can you walk comfortably? more she walks patient will have increased pain that stops her from walking further   Patient Stated Goals walk 20 minutes with minimal limp   Currently in Pain? Yes   Pain Score 4    Pain Location Groin   Pain Orientation Right   Pain Descriptors / Indicators Tightness   Pain Type Chronic pain   Pain Radiating Towards right upper thigh   Pain Onset More than a month ago   Pain Frequency Constant   Aggravating Factors  walking more   Pain Relieving Factors using a cane with walking, not walking as much, therapy   Effect of Pain on Daily Activities walking   Multiple Pain Sites No                         [facility] Adult PT Treatment/Exercise - 06/17/16 0001      Self-Care   Self-Care Other Self-Care Comments   Other Self-Care Comments  how to use vitamin E or coconut oil in anal canal to keep the skin moisturized to reduce chance of splitting the skin     Manual  Therapy   Manual Therapy Internal Pelvic Floor;Soft tissue mobilization   Soft tissue mobilization soft tissue abdominal massage to promote perastalsis movement of the intestines; right hamstring, hip adductors, and quads   Internal Pelvic Floor bilateral sides of urethra on urethra sphincter, post. intoitus; tried the sides of introitus but too painful                PT Education - 06/17/16 1310    Education provided Yes   Education Details abdominal massage   Person(s) Educated Patient   Methods Explanation;Demonstration;Handout   Comprehension Returned demonstration;Verbalized understanding          PT Short Term Goals -  02/12/16 1226      PT SHORT TERM GOAL #1   Title pain with walking decreased >/= 25%   Time 4   Period Weeks   Status Achieved           PT Long Term Goals - 06/17/16 1316      PT LONG TERM GOAL #1   Title indpendent with HEP   Time 8   Period Weeks   Status On-going     PT LONG TERM GOAL #2   Title pain with walking decreased >/= 75%   Time 8   Period Weeks   Status On-going     PT LONG TERM GOAL #3   Title ability to flex her right hip with right groin pain decreased >/= 50% due to improved tissue mobility   Time 8   Period Weeks   Status Achieved     PT LONG TERM GOAL #4   Title waking up in middle of night with pain decreased >/=50%   Time 8   Period Weeks   Status Achieved     PT LONG TERM GOAL #5   Title reduction of pain by end of day >/= 50% due to increase tissue mobility   Time 8   Period Weeks   Status On-going           Long Term Clinic Goals - 06/06/16 1120      CC Long Term Goal  #2   Title Pt. will report swelling is adequately managed to enable ADL function at a consistent level.   Baseline We have continued to keep this from becoming more uncomfortable and she has had no infections.   Status On-going     CC Long Term Goal  #4   Title Pain/discomfort at right axilla area will be controlled at 6/10 or less.   Status On-going     CC Long Term Goal  #5   Title Patient will avoid infection by ongoing management of her lymphedema at right breast/axilla/upper arm areas.   Status On-going     CC Long Term Goal  #6   Title Pain in right groin/ischial tuberosity area will be controlled at 3/10 level or less to enable walking with less pain.   Baseline This area pain has stayed at 2-3/10 for some weeks in a row.   Status On-going            Plan - 06/17/16 1311    Clinical Impression Statement Patient is having issues with constipation due to her medications and tore the anal canal this morining. Therapist suggested to patient to use  coconut oil or vitamin E oil to kke the anal canal skin moist.  Patient was very sensitive on the sides of the introitus.  After soft tissue work her urinary stream was 50%  better. Patient has tightness in the right hip adductors and hamstring.  Patient was able to walk better after therapy. Patient will benefit from skilled therapy to improve urinaty stream, reduce pain with walking and improve fascial restrictions.    Rehab Potential Good   PT Frequency 2x / week   PT Duration 8 weeks   PT Treatment/Interventions ADLs/Self Care Home Management;Therapeutic activities;Therapeutic exercise;Patient/family education;Manual techniques;Manual lymph drainage;Scar mobilization   PT Next Visit Plan renewal note to continue therapy, soft tissue on bil. sides of urethra and post. intoritus   PT Home Exercise Plan progress as needed   Consulted and Agree with Plan of Care Patient      Patient will benefit from skilled therapeutic intervention in order to improve the following deficits and impairments:  Increased edema, Increased fascial restricitons, Pain  Visit Diagnosis: Pain in right thigh  Other muscle spasm     Problem List Patient Active Problem List   Diagnosis Date Noted  . Liver metastases ([place]) 02/23/2016  . 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 Zoster 04/04/2015  . Nausea with vomiting 11/18/2014  . Constipation 11/18/2014  . Left-sided thoracic back pain   . Bone metastases ([place]) 11/16/2014  . Back pain 11/15/2014  . Uncontrolled pain 11/14/2014  . Post-lymphadenectomy lymphedema of arm 05/31/2014  . Chest wall pain 03/21/2014  . Abnormal LFTs (liver function tests) 09/12/2013  . Malignant neoplasm of upper-inner quadrant of right breast in female, estrogen receptor positive ([place]) 08/18/2013  . 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 [place], [place], [address] Phone: [phone]   Fax:  [phone]  Name: MIKEYLA MUSIC MRN: [id number] Date of Birth: 1960-06-19

## 2016-06-20 ENCOUNTER — Ambulatory Visit: Payer: 59 | Admitting: Physical Therapy

## 2016-06-20 ENCOUNTER — Ambulatory Visit (HOSPITAL_BASED_OUTPATIENT_CLINIC_OR_DEPARTMENT_OTHER): Payer: 59 | Admitting: Oncology

## 2016-06-20 ENCOUNTER — Other Ambulatory Visit: Payer: Self-pay | Admitting: *Deleted

## 2016-06-20 DIAGNOSIS — I890 Lymphedema, not elsewhere classified: Secondary | ICD-10-CM

## 2016-06-20 DIAGNOSIS — C7951 Secondary malignant neoplasm of bone: Secondary | ICD-10-CM | POA: Diagnosis not present

## 2016-06-20 DIAGNOSIS — C50211 Malignant neoplasm of upper-inner quadrant of right female breast: Secondary | ICD-10-CM

## 2016-06-20 DIAGNOSIS — C787 Secondary malignant neoplasm of liver and intrahepatic bile duct: Secondary | ICD-10-CM

## 2016-06-20 DIAGNOSIS — Z170 Estrogen receptor positive status [ER+]: Principal | ICD-10-CM

## 2016-06-20 DIAGNOSIS — M79651 Pain in right thigh: Secondary | ICD-10-CM

## 2016-06-20 DIAGNOSIS — R945 Abnormal results of liver function studies: Secondary | ICD-10-CM

## 2016-06-20 DIAGNOSIS — C771 Secondary and unspecified malignant neoplasm of intrathoracic lymph nodes: Secondary | ICD-10-CM

## 2016-06-20 DIAGNOSIS — R7989 Other specified abnormal findings of blood chemistry: Secondary | ICD-10-CM

## 2016-06-20 NOTE — Therapy (Signed)
[place], [place], [address] Phone: [phone]   Fax:  [phone]  Physical Therapy Treatment  Patient Details  Name: [name] [name] MRN: [id number] Date of Birth: 10-09-1960 Referring Provider: Dr. Kyung Rudd  Encounter Date: 06/20/2016      PT End of Session - 06/20/16 1223    Visit Number 34  24 for lymph   Number of Visits 25  for lymph   Date for PT Re-Evaluation 06/25/16   Authorization - Number of Visits 140  for 2018   PT Start Time 1020   PT Stop Time 1103   PT Time Calculation (min) 43 min   Activity Tolerance Patient tolerated treatment well   Behavior During Therapy [facility] for tasks assessed/performed      Past Medical History:  Diagnosis Date  . Bone metastases ([place]) dx'd 05/2014  . Breast cancer ([place]) dx'd 2005/2011  . Peripheral vascular disease ([place]) 02/2010   blood clot related to porta cath  . PONV (postoperative nausea and vomiting)   . S/P radiation therapy 07/17/2014 through 08/02/2014    Left mediastinum, left seventh rib 3250 cGy in 13 sessions   . S/P radiation therapy 12/11/2014 through 12/22/2014    Left parietal calvarium 2400 cGy in 8 sessions   . Seizures ([place]) 2010   Isolated incident.    Past Surgical History:  Procedure Laterality Date  . AXILLARY LYMPH NODE DISSECTION  Dec. 2011  . BREAST LUMPECTOMY  2005  . IR GENERIC HISTORICAL  05/21/2016   IR RADIOLOGIST EVAL & MGMT 05/21/2016 Sandi Mariscal, MD [facility] INTERV RAD  . MEDIASTINOTOMY CHAMBERLAIN MCNEIL Left 06/02/2013   Procedure: MEDIASTINOTOMY CHAMBERLAIN MCNEIL;  Surgeon: [name], MD;  Location: [facility];  Service: Thoracic;  Laterality: Left;  LEFT ANTERIOR MEDIASTINOTOMY   . PORTACATH PLACEMENT  12/11  . removal portacath      There were  no vitals filed for this visit.      Subjective Assessment - 06/20/16 1021    Subjective "I'm pooped.  I've been doing things I shouldn't have been doing, but I'm tired of not doing things."   Currently in Pain? Yes   Pain Score 4    Pain Location Groin   Pain Orientation Right   Pain Descriptors / Indicators --  tightness   Pain Type Chronic pain   Pain Radiating Towards right upper thigh   Pain Onset More than a month ago   Aggravating Factors  being more active on her feet   Pain Relieving Factors therapy   Pain Score 6   Pain Location Axilla   Pain Orientation Right   Pain Descriptors / Indicators --  full   Pain Type Chronic pain   Pain Onset More than a month ago   Pain Frequency Constant   Aggravating Factors  increased swelling   Pain Relieving Factors therapy                         [facility] Adult PT Treatment/Exercise - 06/20/16 0001      Manual Therapy   Myofascial Release right axilla with focus on scar tightness   Manual Lymphatic Drainage (MLD) In left sidelying, posterior interaxillary anastomosis and right axillo-inguinal anastomosis; in supine, short neck, left axilla and anterior interaxillary anastomosis, right groin and axillo-inguinal anastomosis, and right upper extremity from fingers to shoulder.  In right sidelying, left periscapular area toward left groin   Other Manual Therapy  soft tissue work at superior aspect of right medial and upper thigh in prone position for pain relief and muscle relaxation, gentle myofascial release at upper thigh, with releases today from far medial to lateral aspects of posterior thigh                  PT Short Term Goals - 02/12/16 1226      PT SHORT TERM GOAL #1   Title pain with walking decreased >/= 25%   Time 4   Period Weeks   Status Achieved           PT Long Term Goals - 06/17/16 1316      PT LONG TERM GOAL #1   Title indpendent with HEP   Time 8   Period Weeks   Status  On-going     PT LONG TERM GOAL #2   Title pain with walking decreased >/= 75%   Time 8   Period Weeks   Status On-going     PT LONG TERM GOAL #3   Title ability to flex her right hip with right groin pain decreased >/= 50% due to improved tissue mobility   Time 8   Period Weeks   Status Achieved     PT LONG TERM GOAL #4   Title waking up in middle of night with pain decreased >/=50%   Time 8   Period Weeks   Status Achieved     PT LONG TERM GOAL #5   Title reduction of pain by end of day >/= 50% due to increase tissue mobility   Time 8   Period Weeks   Status On-going           Long Term Clinic Goals - 06/06/16 1120      CC Long Term Goal  #2   Title Pt. will report swelling is adequately managed to enable ADL function at a consistent level.   Baseline We have continued to keep this from becoming more uncomfortable and she has had no infections.   Status On-going     CC Long Term Goal  #4   Title Pain/discomfort at right axilla area will be controlled at 6/10 or less.   Status On-going     CC Long Term Goal  #5   Title Patient will avoid infection by ongoing management of her lymphedema at right breast/axilla/upper arm areas.   Status On-going     CC Long Term Goal  #6   Title Pain in right groin/ischial tuberosity area will be controlled at 3/10 level or less to enable walking with less pain.   Baseline This area pain has stayed at 2-3/10 for some weeks in a row.   Status On-going            Plan - 06/20/16 1225    Clinical Impression Statement Therapy continues to keep lymphedema controlled so that she functions and avoids infection; therapy to right thigh improved pain and function.   Rehab Potential Good   Clinical Impairments Affecting Rehab Potential active cancer   PT Frequency 2x / week   PT Duration 12 weeks   PT Treatment/Interventions ADLs/Self Care Home Management;Therapeutic activities;Therapeutic exercise;Patient/family education;Manual  techniques;Manual lymph drainage;Scar mobilization   PT Next Visit Plan Renewal due.  Continue manual lymph drainage, myofascial release, and soft tissue work.   Consulted and Agree with Plan of Care Patient      Patient will benefit from skilled therapeutic intervention in order to improve the following deficits  and impairments:  Increased edema, Increased fascial restricitons, Pain  Visit Diagnosis: Pain in right thigh  Lymphedema, not elsewhere classified     Problem List Patient Active Problem List   Diagnosis Date Noted  . Liver metastases ([place]) 02/23/2016  . 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 Zoster 04/04/2015  . Nausea with vomiting 11/18/2014  . Constipation 11/18/2014  . Left-sided thoracic back pain   . Bone metastases ([place]) 11/16/2014  . Back pain 11/15/2014  . Uncontrolled pain 11/14/2014  . Post-lymphadenectomy lymphedema of arm 05/31/2014  . Chest wall pain 03/21/2014  . Abnormal LFTs (liver function tests) 09/12/2013  . Malignant neoplasm of upper-inner quadrant of right breast in female, estrogen receptor positive ([place]) 08/18/2013  . 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

## 2016-06-20 NOTE — Progress Notes (Signed)
[name] [name]  [phone] [phone]     ID: [name] [name] OB: 1960/11/03  MR#: [id number]  OZD#:[id number]  PCP: Pcp Not In System GYN:  [name] [name] SU:  OTHER MD: [name] [name], [name] [name], [name] [name], [name] [name], [name] [name], [name] [name]   CHIEF COMPLAINT: Stage IV breast cancer  CURRENT TREATMENT: Fulvestrant, denosumab, palbociclib, letrozole  INTERVAL HISTORY:   [name] [name] returns today for follow-up of her metastatic estrogen receptor positive breast cancer accompanied by her husband [name] [name]. Since the last visit here she was started on letrozole in addition to her ongoing treatment with fulvestrant denosumab and palbociclib. So far she is tolerating the letrozole with no significant problems and in particular her main concern, which is transaminases elevations, is not significant.  She continues to tolerate the fulvestrant and denosumab/Xgeva without any unusual side effects. She is currently on palbociclib at 75 mg daily 21 days on and 7 days off, this being her off week.   REVIEW OF SYiSTEMS: [name] [name] continues to have low back pain, which is aggravated by any amount of lifting (she was trying to move some furniture recently). She continues to have nausea and constipation problems. Bowel movements sometimes are so hardly feel like glass is going through and cutting her. There is some bright red blood at times. The stool tends to be yellow while she is on the palbociclib but otherwise normal. She continues to have altered taste. She has mild hot flashes, some night sweats, and thinks her hair is coming out a little more since she started letrozole. She has felt a little woozy as she frequently does in the second week of palbociclib but there have been no falls. A detailed review of systems today was otherwise stable.  BREAST CANCER HISTORY: From doctor [name] [name]'s intake note 03/20/2004:  "The patient is a very  pleasant 56 year old female, without significant past medical history.  Her family history is significant for a sister who at age 71 was diagnosed with invasive ductal carcinoma.  She is a breast cancer survivor at age 67 now.  The patient states that she has never really had a screening mammogram until October 2005, when she felt that it was time for her to start having mammograms done on a yearly basis.  Therefore, on 01/26/04, she underwent a screening mammogram and an abnormality was detected in the upper outer right breast.  She, therefore, underwent spot compression views of both the right and the left breast.  The left breast revealed a well-defined mass in the upper outer left quadrant, present at the 2 o'clock position, measuring 1.8 cm, 6 cm from the nipple.  This, by ultrasound, was felt to be a simple cyst measuring 1.8 cm.  On the right breast, a spiculated mass was noted in the upper outer right quadrant.  The ultrasound revealed a shadowing irregular solid mass at the 10:30 position, 9 cm from the nipple, measuring 1.2 cm in greatest dimension, correlating with the spiculated mass seen on the mammogram.  The right axilla was negative ultrasonically.  Because of this, the patient underwent a needle biopsy of the right breast and the biopsy was positive invasive mammary carcinoma that showed features consistent with a high-grade invasive ductal carcinoma associated with desmoplastic stroma.  No in situ component was seen and no definite lymphovascular invasion was identified.  On the core biopsy, the tumor measured about 0.8 cm.  Because of this, she was seen by [name] [name] and  the patient was taken to the OR on March 15, 2004.  She underwent a right breast lumpectomy with sentinel node biopsy.  The final pathology revealed an invasive ductal carcinoma, measuring 1.7 cm, grade 2 of 3.  Margins were free of tumor.  Atypical lobular hyperplasia was noted.  One sentinel node was removed which  was negative for metastatic disease.  The tumor was staged at T1c, N0 MX.  It was estrogen receptor positive, progesterone receptor positive.  HER-2/neu was 2+.  FISH was negative.  All margins were free of tumor.  She is now seen in Medical Oncology for further evaluation and management of this newly diagnosed T1c, node negative, stage I, invasive ductal carcinoma of the right breast."  Her subsequent history is as detailed below   PAST MEDICAL HISTORY: Past Medical History:  Diagnosis Date  . Bone metastases ([place]) dx'd 05/2014  . Breast cancer ([place]) dx'd 2005/2011  . Peripheral vascular disease ([facility]) 02/2010   blood clot related to porta cath  . PONV (postoperative nausea and vomiting)   . S/P radiation therapy 07/17/2014 through 08/02/2014    Left mediastinum, left seventh rib 3250 cGy in 13 sessions   . S/P radiation therapy 12/11/2014 through 12/22/2014    Left parietal calvarium 2400 cGy in 8 sessions   . Seizures ([place] [facility]) 2010   Isolated incident.    PAST SURGICAL HISTORY: Past Surgical History:  Procedure Laterality Date  . AXILLARY LYMPH NODE DISSECTION  Dec. 2011  . BREAST LUMPECTOMY  2005  . IR GENERIC HISTORICAL  05/21/2016   IR RADIOLOGIST EVAL & MGMT 05/21/2016 Sandi Mariscal, MD [facility] INTERV RAD  . MEDIASTINOTOMY CHAMBERLAIN MCNEIL Left 06/02/2013   Procedure: MEDIASTINOTOMY CHAMBERLAIN MCNEIL;  Surgeon: [name], MD;  Location: [facility];  Service: Thoracic;  Laterality: Left;  LEFT ANTERIOR MEDIASTINOTOMY   . PORTACATH PLACEMENT  12/11  . removal portacath      FAMILY HISTORY Family History  Problem Relation Age of Onset  . COPD Mother   . Breast cancer Sister 24   The patient's father is living, 71 years old as of may 2015. He lives in [place]. The patient's mother died from complications of COPD at the age of  61. These has 2 brothers, one sister. Her sister developed breast cancer at the age of 40. She is doing well. The patient herself underwent genetic testing at [facility] in 2011 and was found to be BRCA negative  GYNECOLOGIC HISTORY:  Menarche age 49, she is GX P0. She stopped having periods with her initial chemotherapy in 2006.  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 Her husband [name] [name] is a Medical illustrator in [place]. He has a child from a prior marriage. At home they have 2 rescue dogs, [name] [name] and [name] [name]. The patient is religious but not a church attender    ADVANCED DIRECTIVES: In place; at the 08/04/2014 visit in particular the patient was very clear, with her husband present, that she would not want any kind of feeding tubes or "other tubes" if her condition deteriorated.   HEALTH MAINTENANCE: Social History  Substance Use Topics  . Smoking status: Never Smoker  . Smokeless tobacco: Never Used  . Alcohol use No     Colonoscopy:  PAP:  Bone density: March 2015; mild osteopenia  Lipid panel:  Allergies  Allergen Reactions  . 2nd Skin Quick Heal Other (See Comments)  Other Reaction: Skin peels  . Decadron [Dexamethasone] Other (See Comments)    Patient does not tolerate steroids.   . Dilaudid [Hydromorphone] Nausea And Vomiting  . Enoxaparin Other (See Comments)    unknown  . Fluconazole Swelling    Liver toxicity  . Hydromorphone Hcl Nausea And Vomiting  . Morphine And Related Nausea And Vomiting  . Protonix [Pantoprazole Sodium] Other (See Comments)    Patient reports it caused thrush.  . Tegaderm Ag Mesh [Silver]     Current Outpatient Prescriptions  Medication Sig Dispense Refill  . ALPRAZolam (XANAX) 0.5 MG tablet Take 1 tablet (0.5 mg total) by mouth 2 (two) times daily as needed for anxiety. 30 tablet 0  . B Complex-C (B-COMPLEX WITH VITAMIN C) tablet Take 1 tablet by mouth daily. Reported  on 03/27/2015    . calcium carbonate (TUMS - DOSED IN MG ELEMENTAL CALCIUM) 500 MG chewable tablet Chew 1 tablet by mouth as directed.    . cholecalciferol 2000 UNITS tablet Take 1 tablet (2,000 Units total) by mouth daily.    . ciprofloxacin (CIPRO) 500 MG tablet Take 1 tablet (500 mg total) by mouth 2 (two) times daily. 10 tablet 0  . ciprofloxacin (CIPRO) 500 MG tablet Take 1 tablet (500 mg total) by mouth 2 (two) times daily. 10 tablet 0  . Diphenhyd-Hydrocort-Nystatin (FIRST-DUKES MOUTHWASH) SUSP 5-10 ml qid SWISH AND SPIT 240 mL 3  . everolimus (AFINITOR) 2.5 MG tablet Take 1 tablet (2.5 mg total) by mouth daily. Caution:chemotherapy. 15 tablet 6  . folic acid (FOLVITE) 1 MG tablet Take 1 tablet (1 mg total) by mouth daily.    Marland Kitchen letrozole (FEMARA) 2.5 MG tablet Take 1 tablet (2.5 mg total) by mouth daily. 90 tablet 3  . loratadine-pseudoephedrine (CLARITIN-D 24-HOUR) 10-240 MG 24 hr tablet Take 1 tablet by mouth daily.    . Melatonin 3 MG TABS Take 3 mg by mouth at bedtime.    . methylPREDNISolone (MEDROL) 4 MG tablet Take 1 tablet (57m) x3 per day for 1 day, then 1 tablet x2 per day for 1 day, then 1 tablet per day for 1 day, then discontinue. 6 tablet 0  . naproxen sodium (ANAPROX) 220 MG tablet Take 220 mg by mouth 2 (two) times daily with a meal.    . palbociclib (IBRANCE) 75 MG capsule Take 1 capsule (75 mg total) by mouth daily with breakfast. Take whole with food. 21 capsule 6  . pentoxifylline (TRENTAL) 400 MG CR tablet Take 1 tablet (400 mg total) by mouth 2 (two) times daily. Take 400 units vitamin E twice a day with this medication. 60 tablet 3  . phenazopyridine (PYRIDIUM) 100 MG tablet Take 1 tablet (100 mg total) by mouth 3 (three) times daily as needed for pain. 30 tablet 0  . phenazopyridine (PYRIDIUM) 200 MG tablet Take 1 tablet (200 mg total) by mouth 3 (three) times daily as needed for pain. 30 tablet 1  . RABEprazole Sodium 5 MG CPSP Take 5 mg by mouth daily. 30 capsule 3   . saccharomyces boulardii (FLORASTOR) 250 MG capsule Take 250 mg by mouth daily.     . vitamin E (VITAMIN E) 400 UNIT capsule Take 1 capsule (400 Units total) by mouth 2 (two) times daily. 60 capsule 3   No current facility-administered medications for this visit.     OBJECTIVE: Middle-aged white woman In no acute distress  There were no vitals filed for this visit.   There is no height or  weight on file to calculate BMI.   Patient refused vitals 06/21/16    ECOG FS: 1  Sclerae unicteric, EOMs intact Oropharynx clear and moist No cervical or supraclavicular adenopathy Lungs no rales or rhonchi Heart regular rate and rhythm Abd soft, nontender, positive bowel sounds MSK no focal spinal tenderness, no upper or lower extremity lymphedema Neuro: nonfocal, well oriented, appropriate affect Breasts: Deferred  LAB RESULTS:   CMP     Component Value Date/Time   NA 136 06/11/2016 1105   K 4.9 06/11/2016 1105   CL 103 12/14/2014 0800   CL 105 05/06/2012 1333   CO2 26 06/11/2016 1105   GLUCOSE 69 (L) 06/11/2016 1105   GLUCOSE 124 (H) 05/06/2012 1333   BUN 10.0 06/11/2016 1105   CREATININE 0.8 06/11/2016 1105   CALCIUM 9.6 06/11/2016 1105   PROT 7.2 06/11/2016 1105   ALBUMIN 3.9 06/11/2016 1105   AST 41 (H) 06/11/2016 1105   ALT 51 06/11/2016 1105   ALKPHOS 98 06/11/2016 1105   BILITOT 0.39 06/11/2016 1105   GFRNONAA >60 12/14/2014 0800   GFRAA >60 12/14/2014 0800    No results found for: SPEP  Lab Results  Component Value Date   WBC 2.8 (L) 06/16/2016   NEUTROABS 1.9 06/16/2016   HGB 10.8 (L) 06/16/2016   HCT 30.4 (L) 06/16/2016   MCV 105.6 (H) 06/16/2016   PLT 213 06/16/2016      Chemistry      Component Value Date/Time   NA 136 06/11/2016 1105   K 4.9 06/11/2016 1105   CL 103 12/14/2014 0800   CL 105 05/06/2012 1333   CO2 26 06/11/2016 1105   BUN 10.0 06/11/2016 1105   CREATININE 0.8 06/11/2016 1105      Component Value Date/Time   CALCIUM 9.6  06/11/2016 1105   ALKPHOS 98 06/11/2016 1105   AST 41 (H) 06/11/2016 1105   ALT 51 06/11/2016 1105   BILITOT 0.39 06/11/2016 1105     No results for input(s): INR in the last 168 hours.  Urinalysis    Component Value Date/Time   COLORURINE YELLOW 11/17/2014 0143   APPEARANCEUR CLOUDY (A) 11/17/2014 0143   LABSPEC 1.005 05/16/2016 1201   PHURINE 6.5 05/16/2016 1201   PHURINE 6.5 11/17/2014 0143   GLUCOSEU Negative 05/16/2016 1201   HGBUR Negative 05/16/2016 1201   HGBUR NEGATIVE 11/17/2014 0143   BILIRUBINUR Negative 05/16/2016 1201   KETONESUR Negative 05/16/2016 1201   KETONESUR NEGATIVE 11/17/2014 0143   PROTEINUR Negative 05/16/2016 1201   PROTEINUR NEGATIVE 11/17/2014 0143   UROBILINOGEN 0.2 05/16/2016 1201   NITRITE Negative 05/16/2016 1201   NITRITE NEGATIVE 11/17/2014 0143   LEUKOCYTESUR Small 05/16/2016 1201     STUDIES: No results found.  ASSESSMENT: 56 y.o. BRCA negative [place] woman with stage IV breast cancer, history as follows  (1)  S/p Right upper inner quadrant lumpectomy and sentinel lymph node sampling 03/15/2004 for a pT1c pN0. Stage IA invasive ductal carcinoma, grade 2, estrogen receptor 95% positive, progesterone receptor 65% positive, HER-2 not amplified; additional surgery 04/25/2004 for seroma or clearance showed no residual tumor  (2) adjuvant chemotherapy with cyclophosphamide and doxorubicin every 21 days x4 completed 07/19/2004  (3) adjuvant radiation given under Dr. Donella Stade in [place] completed July 2006  (4) the patient opted against adjuvant antiestrogen therapy  (5) genetics testing showed no BRCA mutations  (6) biopsy of a palpable right axillary mass 10/24/2009 showed invasive ductal carcinoma, grade 3, estrogen receptor 100% positive, progesterone receptor 2%  positive (alert score 5) HER-2 negative; no evidence of systemic disease on PET scanning  (7) completed 3 of 4 planned cycles of docetaxel and cyclophosphamide September  2011, fourth cycle omitted because of marked elevations in liver function tests  (8) an right axillary lymph node dissection 03/06/2010 showed 3/8 lymph nodes removed to be involved by tumor, with extracapsular extension.  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, TTS-1 negative), estrogen receptor 80% positive, progesterone receptor 2% positive, HER-2 not amplified  (12) letrozole started March 2015-- tolerated with significant side effects, discontinued at the end of May 2015  (13) PET scan 08/16/2013 shows extensive left pleural metastatic disease and a large left pleural effusion that shifts cardiac and mediastinal structures to the right; adenopathy (celiac trunk, periadrenal, periaortic); and a left medial clavicular lesion; Status post left thoracentesis 08/16/2013 positive for adenocarcinoma, estrogen receptor positive, progesterone receptor negative.  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; everolimus was suggested as an available targeted agent  (18) exemestane started 03/31/2014, discontinued 10/31/2014 with evidence of  progression  (a) everolimus added 04/03/2014 but not tolerated (cytopenias, elevated LFTs) even at minimal doses; stopped 04/17/2014  (19) fulvestrant started 12/20/2014  (a) palbociclib added at very low dose 04/03/2015 (starting dose 75 mg weekly)  (b) palbociclib dose gradually increased to 75 mg daily, 21/7, as of May 2017  (c) palbociclib dose increased to 100 mg daily, 21/ 7, beginning November mid- cycle  (d) palbociclib dose decreased to 75 mg daily beginning with cycle starting 05/25/2016  (e) letrozole 2.5 mg started 05/26/2016  (20) liver biopsy 03/20/2015 confirms metastatic carcinoma, still estrogen receptor positive at 100%, progesterone receptor negative, HER-2 equivocal with a signals ratio 1.41, number per cell 4.50.   (a) repeat liver biopsy December 2017 might show further changes (HER-2 positivity)  (21) immunohistochemistry for mismatched repair protein mutations 03/20/2015 showed normal major and minor MMR proteins, with a very low probability of microsatellite instability ([id number])  (22) adjuvant radiation 12/24/15-01/02/16 Site/dose:   1) Left T9 Rib / 24 Gy in 8 fx                         2) Right inferior pelvis/ 24 Gy in 8 fx  (23) consider pembrolizumab (obtained compassionate release from company)  (a) liver biopsy 03/20/2015 negative for PD-1      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 treated with Lovenox for 5-6 months  (c) right upper extremity lymphedema--receiving physical therapy  (d) hepatic steatosis with chronically elevated LFTs as well as unusual hepatic sensitivity to chemotherapy  (e) osteopenia with the lowest T score -1.6 on bone density scan 06/20/2013  (  i) on denosumab/ Xgeva Q28d  (f) radiation oncology (Dr Valere Dross) has reviewed prior radiation records in case there is further mediastinal involvement with dysphagia  etc in which case palliative XRT could be considered  (a) radiation to left mediastinum/ left 7th rib 3250 cGy in 13 sessions04/18/2016 through 08/02/2014  (b) radiation to T11 area: 22 Gy in 7 sessions, last dose 11/27/2014  (c) radiation left parietal scalp region to be completed 12/22/2014  (d) radiation to sacral area completed 04/09/2015  (e) radiation to right inferior pelvis and left ninth rib (24 gray, 12/24/2015--01/02/2016)  (f) T11 was treated stereotactically with 14 gray in 1 fraction 03/12/2016.  (g) chest wall and perineal pain--improved post radiation treatments  (a) discussed celebrex/ carafate but demurs  (h) zoster diagnosed 04/04/2015-- on valacyclovir--resolved   PLAN: I spent approximately 30 minutes with [name] with most of that time spent discussing her symptoms and concerns. From a treatment point of view the fact that there has been essentially no significant change in her transaminases since we started Femara is very important. The plan is to continue this at least another month and more likely 2 months before restaging.  She does have a repeat scan scheduled for April 12 and unfortunately I will be out of town at that time. I have sent a note to Dr. Jobe Igo if he is in town to review it so she can at least read the report before her return from [place].  She continues to be concerned that this is our "last ditch" and this combination of anti-estrogen therapy does not work there is "normal rales to go". That however is not the case. Notably can we try pembrolizumab which is already available for her but we can go to very low doses of chemotherapy such as topotecan for example which I think she would be able to tolerate and might be effective. There is also the possibility of radio embolization which she has discussed with Dr. Pascal Lux  She continues to benefit from physical therapy. She will see me again in April 20, which will give [place] time to have her 07/10/2016  studies reviewed and if necessary confirmed with other studies. She knows to call for any problems that may develop before that visit.  Marland Kitchen     Chauncey Cruel, MD   06/21/2016 2:11 PM

## 2016-06-23 ENCOUNTER — Other Ambulatory Visit (HOSPITAL_BASED_OUTPATIENT_CLINIC_OR_DEPARTMENT_OTHER): Payer: 59

## 2016-06-23 ENCOUNTER — Ambulatory Visit: Payer: 59 | Admitting: Physical Therapy

## 2016-06-23 DIAGNOSIS — C7951 Secondary malignant neoplasm of bone: Secondary | ICD-10-CM

## 2016-06-23 DIAGNOSIS — C771 Secondary and unspecified malignant neoplasm of intrathoracic lymph nodes: Secondary | ICD-10-CM | POA: Diagnosis not present

## 2016-06-23 DIAGNOSIS — M79651 Pain in right thigh: Secondary | ICD-10-CM

## 2016-06-23 DIAGNOSIS — C50211 Malignant neoplasm of upper-inner quadrant of right female breast: Secondary | ICD-10-CM

## 2016-06-23 DIAGNOSIS — Z170 Estrogen receptor positive status [ER+]: Secondary | ICD-10-CM

## 2016-06-23 DIAGNOSIS — C787 Secondary malignant neoplasm of liver and intrahepatic bile duct: Secondary | ICD-10-CM | POA: Diagnosis not present

## 2016-06-23 DIAGNOSIS — I890 Lymphedema, not elsewhere classified: Secondary | ICD-10-CM

## 2016-06-23 LAB — COMPREHENSIVE METABOLIC PANEL
ALT: 67 U/L — AB (ref 0–55)
AST: 56 U/L — ABNORMAL HIGH (ref 5–34)
Albumin: 4 g/dL (ref 3.5–5.0)
Alkaline Phosphatase: 104 U/L (ref 40–150)
Anion Gap: 8 mEq/L (ref 3–11)
BILIRUBIN TOTAL: 0.34 mg/dL (ref 0.20–1.20)
BUN: 8.1 mg/dL (ref 7.0–26.0)
CO2: 27 meq/L (ref 22–29)
CREATININE: 0.8 mg/dL (ref 0.6–1.1)
Calcium: 10 mg/dL (ref 8.4–10.4)
Chloride: 103 mEq/L (ref 98–109)
EGFR: 80 mL/min/{1.73_m2} — ABNORMAL LOW (ref >90)
GLUCOSE: 74 mg/dL (ref 70–140)
Potassium: 4.7 mEq/L (ref 3.5–5.1)
SODIUM: 138 meq/L (ref 136–145)
TOTAL PROTEIN: 7.2 g/dL (ref 6.4–8.3)

## 2016-06-23 LAB — CBC WITH DIFFERENTIAL/PLATELET
BASO%: 1.8 % (ref 0.0–2.0)
BASOS ABS: 0.1 10*3/uL (ref 0.0–0.1)
EOS ABS: 0.1 10*3/uL (ref 0.0–0.5)
EOS%: 1.8 % (ref 0.0–7.0)
HEMATOCRIT: 31.9 % — AB (ref 34.8–46.6)
HEMOGLOBIN: 11.1 g/dL — AB (ref 11.6–15.9)
LYMPH#: 0.6 10*3/uL — AB (ref 0.9–3.3)
LYMPH%: 22.4 % (ref 14.0–49.7)
MCH: 37.5 pg — ABNORMAL HIGH (ref 25.1–34.0)
MCHC: 34.8 g/dL (ref 31.5–36.0)
MCV: 107.8 fL — AB (ref 79.5–101.0)
MONO#: 0.5 10*3/uL (ref 0.1–0.9)
MONO%: 17.7 % — ABNORMAL HIGH (ref 0.0–14.0)
NEUT#: 1.6 10*3/uL (ref 1.5–6.5)
NEUT%: 56.3 % (ref 38.4–76.8)
PLATELETS: 212 10*3/uL (ref 145–400)
RBC: 2.96 10*6/uL — AB (ref 3.70–5.45)
RDW: 13.9 % (ref 11.2–14.5)
WBC: 2.8 10*3/uL — AB (ref 3.9–10.3)

## 2016-06-23 NOTE — Therapy (Signed)
[place], [place], [address] Phone: [phone]   Fax:  [phone]  Physical Therapy Treatment  Patient Details  Name: [name] [name] MRN: [id number] Date of Birth: 09-Aug-1960 Referring Provider: Dr. Kyung Rudd  Encounter Date: 06/23/2016      PT End of Session - 06/23/16 1120    Visit Number 35  25 for lymph   Number of Visits 49  for lymph   Date for PT Re-Evaluation 09/15/16   Authorization - Visit Number 66   Authorization - Number of Visits 140   PT Start Time 1020   PT Stop Time 1108   PT Time Calculation (min) 48 min   Activity Tolerance Patient tolerated treatment well   Behavior During Therapy [facility] for tasks assessed/performed      Past Medical History:  Diagnosis Date  . Bone metastases ([place]) dx'd 05/2014  . Breast cancer ([place]) dx'd 2005/2011  . Peripheral vascular disease ([place]) 02/2010   blood clot related to porta cath  . PONV (postoperative nausea and vomiting)   . S/P radiation therapy 07/17/2014 through 08/02/2014    Left mediastinum, left seventh rib 3250 cGy in 13 sessions   . S/P radiation therapy 12/11/2014 through 12/22/2014    Left parietal calvarium 2400 cGy in 8 sessions   . Seizures ([place]) 2010   Isolated incident.    Past Surgical History:  Procedure Laterality Date  . AXILLARY LYMPH NODE DISSECTION  Dec. 2011  . BREAST LUMPECTOMY  2005  . IR GENERIC HISTORICAL  05/21/2016   IR RADIOLOGIST EVAL & MGMT 05/21/2016 Sandi Mariscal, MD [facility] INTERV RAD  . MEDIASTINOTOMY CHAMBERLAIN MCNEIL Left 06/02/2013   Procedure: MEDIASTINOTOMY CHAMBERLAIN MCNEIL;  Surgeon: [name], MD;  Location: [facility];  Service: Thoracic;  Laterality: Left;  LEFT ANTERIOR MEDIASTINOTOMY   . PORTACATH PLACEMENT  12/11  . removal  portacath      There were no vitals filed for this visit.      Subjective Assessment - 06/23/16 1021    Subjective Feeling sick today. "I think my counts are low."   Currently in Pain? Yes   Pain Score 3    Pain Location Groin   Pain Orientation Right   Pain Score 6   Pain Location Axilla   Pain Orientation Right                         [facility] Adult PT Treatment/Exercise - 06/23/16 0001      Self-Care   Other Self-Care Comments  Discussed with patient the possibility of teaching her self-manual lymph drainage.  She reports she had learned this from a therapist in the [facility] system, and found it was not effective when she does it herself.  She reminded me that she and I had talked about this before today as well.     Manual Therapy   Myofascial Release right axilla with focus on scar tightness   Manual Lymphatic Drainage (MLD) In left sidelying, posterior interaxillary anastomosis and right axillo-inguinal anastomosis; in supine, short neck, left axilla and anterior interaxillary anastomosis, right groin and axillo-inguinal anastomosis, and right upper extremity from fingers to shoulder.  In right sidelying, left periscapular area toward left groin   Other Manual Therapy soft tissue work at superior aspect of right medial and upper thigh in prone position for pain relief and muscle relaxation, gentle myofascial release at upper thigh, with releases today from far medial to  lateral aspects of posterior thigh                  PT Short Term Goals - 02/12/16 1226      PT SHORT TERM GOAL #1   Title pain with walking decreased >/= 25%   Time 4   Period Weeks   Status Achieved           PT Long Term Goals - 06/17/16 1316      PT LONG TERM GOAL #1   Title indpendent with HEP   Time 8   Period Weeks   Status On-going     PT LONG TERM GOAL #2   Title pain with walking decreased >/= 75%   Time 8   Period Weeks   Status On-going     PT LONG TERM  GOAL #3   Title ability to flex her right hip with right groin pain decreased >/= 50% due to improved tissue mobility   Time 8   Period Weeks   Status Achieved     PT LONG TERM GOAL #4   Title waking up in middle of night with pain decreased >/=50%   Time 8   Period Weeks   Status Achieved     PT LONG TERM GOAL #5   Title reduction of pain by end of day >/= 50% due to increase tissue mobility   Time 8   Period Weeks   Status On-going           Long Term Clinic Goals - 06/23/16 1126      CC Long Term Goal  #1   Title Reports left upper back and left side pain is controlled at 4/10 or less with therapy at the current frequency.   Status Achieved     CC Long Term Goal  #2   Title Pt. will report swelling is adequately managed to enable ADL function at a consistent level.   Status On-going     CC Long Term Goal  #3   Title Pain in area of right ischial tuberosity will be reduced to 1/10 or less, and patient will no longer need assistive device for ambulation.   Status Deferred     CC Long Term Goal  #4   Title Pain/discomfort at right axilla area will be controlled at 6/10 or less.   Baseline She has maintained this level for some weeks now.   Status On-going     CC Long Term Goal  #5   Title Patient will avoid infection by ongoing management of her lymphedema at right breast/axilla/upper arm areas.   Baseline She has not had any infection.   Status On-going     CC Long Term Goal  #6   Title Pain in right groin/ischial tuberosity area will be controlled at 3/10 level or less to enable walking with less pain.   Baseline This pain usually stays in the 2-3 range, but at times increases to 4/10 if she has been on her feet and more active.   Status On-going            Plan - 06/23/16 1122    Clinical Impression Statement Patient with a little less tightness at upper outer posterior thigh today, though still mild-moderate.  Right hand shows mild swelling and right  upper outer breast moderate swelling, but not very indurated today.  Patient continues to benefit from manual lymph drainage for her right upper outer breast/chest and axilla swelling as well as  right hand swelling.  This helps her comfort and as a result improves her function; it also decreases the chance of her developing an infection.  Right thigh pain is also helped by myofascial release and soft tissue work techniques, which helps her every day activities.   Rehab Potential Good   Clinical Impairments Affecting Rehab Potential active cancer   PT Frequency 2x / week   PT Duration 12 weeks   PT Treatment/Interventions ADLs/Self Care Home Management;Therapeutic activities;Therapeutic exercise;Patient/family education;Manual techniques;Manual lymph drainage;Scar mobilization   PT Next Visit Plan Continue manual lymph drainage, myofascial release, and soft tissue work.   Consulted and Agree with Plan of Care Patient      Patient will benefit from skilled therapeutic intervention in order to improve the following deficits and impairments:  Increased edema, Increased fascial restricitons, Pain  Visit Diagnosis: Pain in right thigh - Plan: PT plan of care cert/re-cert  Lymphedema, not elsewhere classified - Plan: PT plan of care cert/re-cert     Problem List Patient Active Problem List   Diagnosis Date Noted  . Liver metastases ([place]) 02/23/2016  . 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 Zoster 04/04/2015  . Nausea with vomiting 11/18/2014  . Constipation 11/18/2014  . Left-sided thoracic back pain   . Bone metastases ([place]) 11/16/2014  . Back pain 11/15/2014  . Uncontrolled pain 11/14/2014  . Post-lymphadenectomy lymphedema of arm 05/31/2014  . Chest wall pain 03/21/2014  . Abnormal LFTs (liver function tests) 09/12/2013  . Malignant neoplasm of upper-inner quadrant of right breast in female, estrogen receptor positive ([place]) 08/18/2013  . 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

## 2016-06-24 ENCOUNTER — Encounter: Payer: Self-pay | Admitting: Physical Therapy

## 2016-06-24 ENCOUNTER — Ambulatory Visit: Payer: 59 | Admitting: Physical Therapy

## 2016-06-24 DIAGNOSIS — M79651 Pain in right thigh: Secondary | ICD-10-CM | POA: Diagnosis not present

## 2016-06-24 DIAGNOSIS — M25551 Pain in right hip: Secondary | ICD-10-CM

## 2016-06-24 DIAGNOSIS — R2689 Other abnormalities of gait and mobility: Secondary | ICD-10-CM

## 2016-06-24 DIAGNOSIS — M62838 Other muscle spasm: Secondary | ICD-10-CM

## 2016-06-24 NOTE — Therapy (Signed)
[facility] [address], [place], [place], [address] Phone: [phone]   Fax:  [phone]  Physical Therapy Treatment  Patient Details  Name: [name] [name] MRN: [id number] Date of Birth: [birth year] Referring Provider: Dr. Kyung Rudd  Encounter Date: 06/24/2016      PT End of Session - 06/24/16 1320    Visit Number 36   Date for PT Re-Evaluation 12/26/16   Authorization - Visit Number 5   Authorization - Number of Visits 140   PT Start Time 1230   PT Stop Time 1310   PT Time Calculation (min) 40 min   Activity Tolerance Patient tolerated treatment well   Behavior During Therapy [facility] for tasks assessed/performed      Past Medical History:  Diagnosis Date  . Bone metastases ([place]) dx'd 05/2014  . Breast cancer ([place]) dx'd 2005/2011  . Peripheral vascular disease ([place]) 02/2010   blood clot related to porta cath  . PONV (postoperative nausea and vomiting)   . S/P radiation therapy 07/17/2014 through 08/02/2014    Left mediastinum, left seventh rib 3250 cGy in 13 sessions   . S/P radiation therapy 12/11/2014 through 12/22/2014    Left parietal calvarium 2400 cGy in 8 sessions   . Seizures ([place]) 2010   Isolated incident.    Past Surgical History:  Procedure Laterality Date  . AXILLARY LYMPH NODE DISSECTION  Dec. 2011  . BREAST LUMPECTOMY  2005  . IR GENERIC HISTORICAL  05/21/2016   IR RADIOLOGIST EVAL & MGMT 05/21/2016 Sandi Mariscal, MD [facility] INTERV RAD  . MEDIASTINOTOMY CHAMBERLAIN MCNEIL Left 06/02/2013   Procedure: MEDIASTINOTOMY CHAMBERLAIN MCNEIL;  Surgeon: [name], MD;  Location: [facility];  Service: Thoracic;  Laterality: Left;  LEFT ANTERIOR MEDIASTINOTOMY   . PORTACATH PLACEMENT  12/11  . removal portacath      There were no vitals filed  for this visit.      Subjective Assessment - 06/24/16 1233    Subjective I used coconut oil in the anus.    How long can you walk comfortably? more she walks patient will have increased pain that stops her from walking further   Diagnostic tests x rays of back show no fractures;    Patient Stated Goals walk 20 minutes with minimal limp   Currently in Pain? Yes   Pain Score 3    Pain Location Groin   Pain Orientation Right   Pain Descriptors / Indicators Tightness   Pain Type Chronic pain   Pain Onset More than a month ago   Pain Frequency Constant   Aggravating Factors  the more she walks   Pain Relieving Factors therapy   Effect of Pain on Daily Activities walking   Multiple Pain Sites No            [facility] PT Assessment - 06/24/16 0001      Assessment   Medical Diagnosis Osseous Metastasis   Referring Provider Dr. Kyung Rudd   Prior Therapy yes for the pelvic floor and lymphedema     Precautions   Precautions Other (comment)   Precaution Comments No ultrasound     Restrictions   Weight Bearing Restrictions No     Balance Screen   Has the patient fallen in the past 6 months No   Has the patient had a decrease in activity level because of a fear of falling?  No   Is the patient reluctant to leave their home because of a fear  of falling?  No     Home Ecologist residence     Prior Function   Level of Independence Independent     Cognition   Overall Cognitive Status Within Functional Limits for tasks assessed     Observation/Other Assessments   Focus on Therapeutic Outcomes (FOTO)  42% limitation     Posture/Postural Control   Posture/Postural Control No significant limitations     Strength   Right Hip Flexion 4-/5   Right Hip Extension 4/5   Right Hip External Rotation  4+/5   Right Hip Internal Rotation 3+/5   Right Hip ABduction 3+/5   Right Hip ADduction 3+/5     Palpation   SI assessment  pelvis in correct alignment                   Pelvic Floor Special Questions - 06/24/16 0001    Pelvic Floor Internal Exam Patient approves physical therapist to perform pelvic floor muscle assessment   Exam Type Vaginal   Palpation patient able to tolerate pressure in the introitus area   Strength strong squeeze, against strong resistance           [facility] Adult PT Treatment/Exercise - 06/24/16 0001      Manual Therapy   Manual Therapy Internal Pelvic Floor   Soft tissue mobilization right hamstring, hip adductors, and quads   Internal Pelvic Floor bilateral sides of urethra on urethra sphincter, post. intoitus  with gently pressure releasing restrictions                PT Education - 06/24/16 1320    Education provided No          PT Short Term Goals - 02/12/16 1226      PT SHORT TERM GOAL #1   Title pain with walking decreased >/= 25%   Time 4   Period Weeks   Status Achieved           PT Long Term Goals - 06/24/16 1326      PT LONG TERM GOAL #1   Title indpendent with HEP   Time 8   Period Weeks   Status On-going  keep on progressing     PT LONG TERM GOAL #2   Title pain with walking decreased >/= 75%   Time 8   Period Weeks   Status On-going  depends on the day     PT LONG TERM GOAL #3   Title ability to flex her right hip with right groin pain decreased >/= 50% due to improved tissue mobility   Time 8   Period Weeks   Status Achieved     PT LONG TERM GOAL #5   Title reduction of pain by end of day >/= 50% due to increase tissue mobility   Time 8   Period Weeks   Status On-going  30% depending on integrity of bone            Long Term Clinic Goals - 06/23/16 1126      CC Long Term Goal  #1   Title Reports left upper back and left side pain is controlled at 4/10 or less with therapy at the current frequency.   Status Achieved     CC Long Term Goal  #2   Title Pt. will report swelling is adequately managed to enable ADL function at a  consistent level.   Status On-going     CC Long Term  Goal  #3   Title Pain in area of right ischial tuberosity will be reduced to 1/10 or less, and patient will no longer need assistive device for ambulation.   Status Deferred     CC Long Term Goal  #4   Title Pain/discomfort at right axilla area will be controlled at 6/10 or less.   Baseline She has maintained this level for some weeks now.   Status On-going     CC Long Term Goal  #5   Title Patient will avoid infection by ongoing management of her lymphedema at right breast/axilla/upper arm areas.   Baseline She has not had any infection.   Status On-going     CC Long Term Goal  #6   Title Pain in right groin/ischial tuberosity area will be controlled at 3/10 level or less to enable walking with less pain.   Baseline This pain usually stays in the 2-3 range, but at times increases to 4/10 if she has been on her feet and more active.   Status On-going            Plan - 06/24/16 1321    Clinical Impression Statement Patient has 50% less tightness in the right thigh after soft tissue work.  Patient urine stream does not spread as much by 50%. Patient is able to tolerated more pressure in the introitus  area for first time.  Patient has less restrictions on bilateral sides of the urethra sphinter. Patient is still having difficulty with walking due to pain in right hip and thigh.  Due to patient active cancer is is important for her to continue to walk for working on her endurance and strength as her cancer may progress.    Rehab Potential Good   Clinical Impairments Affecting Rehab Potential active cancer   PT Frequency 1x / week   PT Duration Other (comment)  6 months   PT Treatment/Interventions ADLs/Self Care Home Management;Therapeutic activities;Therapeutic exercise;Patient/family education;Manual techniques;Manual lymph drainage;Scar mobilization   PT Next Visit Plan Continue manual lymph drainage, myofascial release, and  soft tissue work.   PT Home Exercise Plan progress as needed   Consulted and Agree with Plan of Care Patient      Patient will benefit from skilled therapeutic intervention in order to improve the following deficits and impairments:  Increased edema, Increased fascial restricitons, Pain  Visit Diagnosis: Pain in right thigh - Plan: PT plan of care cert/re-cert  Other muscle spasm - Plan: PT plan of care cert/re-cert  Pain in right hip - Plan: PT plan of care cert/re-cert  Other abnormalities of gait and mobility - Plan: PT plan of care cert/re-cert     Problem List Patient Active Problem List   Diagnosis Date Noted  . Liver metastases ([place]) 02/23/2016  . 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 Zoster 04/04/2015  . Nausea with vomiting 11/18/2014  . Constipation 11/18/2014  . Left-sided thoracic back pain   . Bone metastases ([place]) 11/16/2014  . Back pain 11/15/2014  . Uncontrolled pain 11/14/2014  . Post-lymphadenectomy lymphedema of arm 05/31/2014  . Chest wall pain 03/21/2014  . Abnormal LFTs (liver function tests) 09/12/2013  . Malignant neoplasm of upper-inner quadrant of right breast in female, estrogen receptor positive ([place]) 08/18/2013  . Secondary malignant neoplasm of mediastinal lymph node ([place]) 08/18/2013    [name], PT 06/24/16 1:30 PM    [facility] [address], [place], [place], [address] Phone: [phone]  Fax:  [phone]  Name: JACQUELYNE QUARRY MRN: [id number] Date of Birth: [birth year]

## 2016-06-26 ENCOUNTER — Ambulatory Visit: Payer: 59 | Admitting: Physical Therapy

## 2016-06-26 DIAGNOSIS — M79651 Pain in right thigh: Secondary | ICD-10-CM | POA: Diagnosis not present

## 2016-06-26 DIAGNOSIS — I890 Lymphedema, not elsewhere classified: Secondary | ICD-10-CM

## 2016-06-26 NOTE — Therapy (Signed)
[place], [place], [address] Phone: [phone]   Fax:  [phone]  Physical Therapy Treatment  Patient Details  Name: [name] [name] MRN: [id number] Date of Birth: [birth year] Referring Provider: Dr. Kyung Rudd  Encounter Date: 06/26/2016      PT End of Session - 06/26/16 1111    Visit Number 48  26 for lymph   Number of Visits 15  for lymph   Date for PT Re-Evaluation 09/15/16  for lymph   Authorization - Visit Number 54   Authorization - Number of Visits 140  for 2018   PT Start Time 1019   PT Stop Time 1105   PT Time Calculation (min) 46 min   Activity Tolerance Patient tolerated treatment well   Behavior During Therapy [facility] for tasks assessed/performed      Past Medical History:  Diagnosis Date  . Bone metastases ([place]) dx'd 05/2014  . Breast cancer ([place]) dx'd 2005/2011  . Peripheral vascular disease ([place]) 02/2010   blood clot related to porta cath  . PONV (postoperative nausea and vomiting)   . S/P radiation therapy 07/17/2014 through 08/02/2014    Left mediastinum, left seventh rib 3250 cGy in 13 sessions   . S/P radiation therapy 12/11/2014 through 12/22/2014    Left parietal calvarium 2400 cGy in 8 sessions   . Seizures ([place]) 2010   Isolated incident.    Past Surgical History:  Procedure Laterality Date  . AXILLARY LYMPH NODE DISSECTION  Dec. 2011  . BREAST LUMPECTOMY  2005  . IR GENERIC HISTORICAL  05/21/2016   IR RADIOLOGIST EVAL & MGMT 05/21/2016 Sandi Mariscal, MD [facility] INTERV RAD  . MEDIASTINOTOMY CHAMBERLAIN MCNEIL Left 06/02/2013   Procedure: MEDIASTINOTOMY CHAMBERLAIN MCNEIL;  Surgeon: [name], MD;  Location: [facility];  Service: Thoracic;  Laterality: Left;  LEFT ANTERIOR MEDIASTINOTOMY   . PORTACATH PLACEMENT   12/11  . removal portacath      There were no vitals filed for this visit.      Subjective Assessment - 06/26/16 1019    Subjective "I just feel yucky."   Currently in Pain? Yes   Pain Score 3   2-3   Pain Location Groin   Pain Orientation Right   Pain Descriptors / Indicators Tightness   Pain Type Chronic pain   Pain Onset More than a month ago   Aggravating Factors  sitting in one place too long sometimes   Pain Score 6   Pain Location Axilla   Pain Orientation Right   Pain Descriptors / Indicators Other (Comment)  full   Pain Type Chronic pain   Pain Onset More than a month ago   Pain Frequency Constant   Aggravating Factors  increased swelling   Pain Relieving Factors therapy                         [facility] Adult PT Treatment/Exercise - 06/26/16 0001      Manual Therapy   Myofascial Release right axilla with focus on scar tightness   Manual Lymphatic Drainage (MLD) In left sidelying, posterior interaxillary anastomosis and right axillo-inguinal anastomosis; in supine, short neck, left axilla and anterior interaxillary anastomosis, right groin and axillo-inguinal anastomosis, and right upper extremity from fingers to shoulder.  In right sidelying, left periscapular area toward left groin   Other Manual Therapy soft tissue work at superior aspect of right medial and upper thigh in prone position for pain  relief and muscle relaxation, gentle myofascial release at upper thigh, with releases today from far medial to lateral aspects of posterior thigh                  PT Short Term Goals - 02/12/16 1226      PT SHORT TERM GOAL #1   Title pain with walking decreased >/= 25%   Time 4   Period Weeks   Status Achieved           PT Long Term Goals - 06/24/16 1326      PT LONG TERM GOAL #1   Title indpendent with HEP   Time 8   Period Weeks   Status On-going  keep on progressing     PT LONG TERM GOAL #2   Title pain with walking  decreased >/= 75%   Time 8   Period Weeks   Status On-going  depends on the day     PT LONG TERM GOAL #3   Title ability to flex her right hip with right groin pain decreased >/= 50% due to improved tissue mobility   Time 8   Period Weeks   Status Achieved     PT LONG TERM GOAL #5   Title reduction of pain by end of day >/= 50% due to increase tissue mobility   Time 8   Period Weeks   Status On-going  30% depending on integrity of bone            Long Term Clinic Goals - 06/23/16 1126      CC Long Term Goal  #1   Title Reports left upper back and left side pain is controlled at 4/10 or less with therapy at the current frequency.   Status Achieved     CC Long Term Goal  #2   Title Pt. will report swelling is adequately managed to enable ADL function at a consistent level.   Status On-going     CC Long Term Goal  #3   Title Pain in area of right ischial tuberosity will be reduced to 1/10 or less, and patient will no longer need assistive device for ambulation.   Status Deferred     CC Long Term Goal  #4   Title Pain/discomfort at right axilla area will be controlled at 6/10 or less.   Baseline She has maintained this level for some weeks now.   Status On-going     CC Long Term Goal  #5   Title Patient will avoid infection by ongoing management of her lymphedema at right breast/axilla/upper arm areas.   Baseline She has not had any infection.   Status On-going     CC Long Term Goal  #6   Title Pain in right groin/ischial tuberosity area will be controlled at 3/10 level or less to enable walking with less pain.   Baseline This pain usually stays in the 2-3 range, but at times increases to 4/10 if she has been on her feet and more active.   Status On-going            Plan - 06/26/16 1113    Clinical Impression Statement Patient's right hand swelling was better today--minimal.  Right upper thigh tightness was also mild.     Rehab Potential Good   Clinical  Impairments Affecting Rehab Potential active cancer   PT Frequency 2x / week   PT Duration 12 weeks   PT Treatment/Interventions ADLs/Self Care Home Management;Therapeutic activities;Therapeutic exercise;Patient/family  education;Manual techniques;Manual lymph drainage;Scar mobilization   PT Next Visit Plan Continue manual lymph drainage, myofascial release, and soft tissue work.   Consulted and Agree with Plan of Care Patient      Patient will benefit from skilled therapeutic intervention in order to improve the following deficits and impairments:  Increased edema, Increased fascial restricitons, Pain  Visit Diagnosis: Pain in right thigh  Lymphedema, not elsewhere classified     Problem List Patient Active Problem List   Diagnosis Date Noted  . Liver metastases ([place]) 02/23/2016  . 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 Zoster 04/04/2015  . Nausea with vomiting 11/18/2014  . Constipation 11/18/2014  . Left-sided thoracic back pain   . Bone metastases ([place]) 11/16/2014  . Back pain 11/15/2014  . Uncontrolled pain 11/14/2014  . Post-lymphadenectomy lymphedema of arm 05/31/2014  . Chest wall pain 03/21/2014  . Abnormal LFTs (liver function tests) 09/12/2013  . Malignant neoplasm of upper-inner quadrant of right breast in female, estrogen receptor positive ([place]) 08/18/2013  . Secondary malignant neoplasm of mediastinal lymph node ([place]) 08/18/2013    Carlosdaniel Grob 06/26/2016, 11:15 AM  [place], [place], [address] Phone: [phone]   Fax:  [phone]  Name: [name] [name] MRN: [id number] Date of Birth: 04-Nov-1960  [name], PT 06/26/16 11:15 AM

## 2016-06-27 ENCOUNTER — Encounter: Payer: 59 | Admitting: Physical Therapy

## 2016-06-30 ENCOUNTER — Ambulatory Visit: Payer: 59 | Attending: Oncology | Admitting: Physical Therapy

## 2016-06-30 ENCOUNTER — Other Ambulatory Visit (HOSPITAL_BASED_OUTPATIENT_CLINIC_OR_DEPARTMENT_OTHER): Payer: 59

## 2016-06-30 DIAGNOSIS — I890 Lymphedema, not elsewhere classified: Secondary | ICD-10-CM | POA: Diagnosis present

## 2016-06-30 DIAGNOSIS — M79651 Pain in right thigh: Secondary | ICD-10-CM | POA: Insufficient documentation

## 2016-06-30 DIAGNOSIS — C50211 Malignant neoplasm of upper-inner quadrant of right female breast: Secondary | ICD-10-CM

## 2016-06-30 DIAGNOSIS — M62838 Other muscle spasm: Secondary | ICD-10-CM | POA: Diagnosis present

## 2016-06-30 DIAGNOSIS — C7951 Secondary malignant neoplasm of bone: Secondary | ICD-10-CM

## 2016-06-30 DIAGNOSIS — C771 Secondary and unspecified malignant neoplasm of intrathoracic lymph nodes: Secondary | ICD-10-CM

## 2016-06-30 DIAGNOSIS — Z170 Estrogen receptor positive status [ER+]: Principal | ICD-10-CM

## 2016-06-30 DIAGNOSIS — C787 Secondary malignant neoplasm of liver and intrahepatic bile duct: Secondary | ICD-10-CM

## 2016-06-30 LAB — COMPREHENSIVE METABOLIC PANEL
ALT: 47 U/L (ref 0–55)
ANION GAP: 8 meq/L (ref 3–11)
AST: 40 U/L — ABNORMAL HIGH (ref 5–34)
Albumin: 4 g/dL (ref 3.5–5.0)
Alkaline Phosphatase: 93 U/L (ref 40–150)
BILIRUBIN TOTAL: 0.4 mg/dL (ref 0.20–1.20)
BUN: 12.6 mg/dL (ref 7.0–26.0)
CO2: 26 meq/L (ref 22–29)
Calcium: 9.8 mg/dL (ref 8.4–10.4)
Chloride: 100 mEq/L (ref 98–109)
Creatinine: 0.9 mg/dL (ref 0.6–1.1)
EGFR: 74 mL/min/{1.73_m2} — AB (ref >90)
Glucose: 79 mg/dl (ref 70–140)
POTASSIUM: 4 meq/L (ref 3.5–5.1)
Sodium: 134 mEq/L — ABNORMAL LOW (ref 136–145)
TOTAL PROTEIN: 7.1 g/dL (ref 6.4–8.3)

## 2016-06-30 LAB — CBC WITH DIFFERENTIAL/PLATELET
BASO%: 2.6 % — ABNORMAL HIGH (ref 0.0–2.0)
BASOS ABS: 0.1 10*3/uL (ref 0.0–0.1)
EOS ABS: 0.1 10*3/uL (ref 0.0–0.5)
EOS%: 2.3 % (ref 0.0–7.0)
HCT: 32.7 % — ABNORMAL LOW (ref 34.8–46.6)
HEMOGLOBIN: 11.4 g/dL — AB (ref 11.6–15.9)
LYMPH#: 0.5 10*3/uL — AB (ref 0.9–3.3)
LYMPH%: 17.4 % (ref 14.0–49.7)
MCH: 37.1 pg — AB (ref 25.1–34.0)
MCHC: 34.9 g/dL (ref 31.5–36.0)
MCV: 106.5 fL — AB (ref 79.5–101.0)
MONO#: 0.2 10*3/uL (ref 0.1–0.9)
MONO%: 7.2 % (ref 0.0–14.0)
NEUT%: 70.5 % (ref 38.4–76.8)
NEUTROS ABS: 1.9 10*3/uL (ref 1.5–6.5)
PLATELETS: 289 10*3/uL (ref 145–400)
RBC: 3.07 10*6/uL — ABNORMAL LOW (ref 3.70–5.45)
RDW: 13.4 % (ref 11.2–14.5)
WBC: 2.7 10*3/uL — ABNORMAL LOW (ref 3.9–10.3)

## 2016-06-30 NOTE — Therapy (Signed)
[place], [place], [address] Phone: [phone]   Fax:  [phone]  Physical Therapy Treatment  Patient Details  Name: [name] [name] MRN: [id number] Date of Birth: Feb 17, 1961 Referring Provider: Dr. Kyung Rudd  Encounter Date: 06/30/2016      PT End of Session - 06/30/16 1307    Visit Number 49  27 for lymph   Number of Visits 58  for lymph   Date for PT Re-Evaluation 09/15/16  for lymph   Authorization - Number of Visits 140  for 2018   PT Start Time 1020   PT Stop Time 1101   PT Time Calculation (min) 41 min   Activity Tolerance Patient tolerated treatment well   Behavior During Therapy [facility] for tasks assessed/performed      Past Medical History:  Diagnosis Date  . Bone metastases ([place]) dx'd 05/2014  . Breast cancer ([place]) dx'd 2005/2011  . Peripheral vascular disease ([place]) 02/2010   blood clot related to porta cath  . PONV (postoperative nausea and vomiting)   . S/P radiation therapy 07/17/2014 through 08/02/2014    Left mediastinum, left seventh rib 3250 cGy in 13 sessions   . S/P radiation therapy 12/11/2014 through 12/22/2014    Left parietal calvarium 2400 cGy in 8 sessions   . Seizures ([place]) 2010   Isolated incident.    Past Surgical History:  Procedure Laterality Date  . AXILLARY LYMPH NODE DISSECTION  Dec. 2011  . BREAST LUMPECTOMY  2005  . IR GENERIC HISTORICAL  05/21/2016   IR RADIOLOGIST EVAL & MGMT 05/21/2016 Sandi Mariscal, MD [facility] INTERV RAD  . MEDIASTINOTOMY CHAMBERLAIN MCNEIL Left 06/02/2013   Procedure: MEDIASTINOTOMY CHAMBERLAIN MCNEIL;  Surgeon: [name], MD;  Location: [facility];  Service: Thoracic;  Laterality: Left;  LEFT ANTERIOR MEDIASTINOTOMY   . PORTACATH PLACEMENT  12/11  . removal portacath       There were no vitals filed for this visit.      Subjective Assessment - 06/30/16 1305    Subjective Tiring activities this weekend.   Currently in Pain? Yes   Pain Score 3    Pain Location Groin   Pain Orientation Right   Pain Descriptors / Indicators Tightness   Pain Type Chronic pain   Pain Score 6   Pain Location Axilla   Pain Orientation Right   Pain Descriptors / Indicators --  fullness   Pain Type Chronic pain   Aggravating Factors  increased swelling/no therapy   Pain Relieving Factors therapy                         [facility] Adult PT Treatment/Exercise - 06/30/16 0001      Manual Therapy   Myofascial Release right axilla with focus on scar tightness   Manual Lymphatic Drainage (MLD) In left sidelying, posterior interaxillary anastomosis and right axillo-inguinal anastomosis; in supine, short neck, left axilla and anterior interaxillary anastomosis, right groin and axillo-inguinal anastomosis, and right upper extremity from fingers to shoulder.  In right sidelying, left periscapular area toward left groin   Other Manual Therapy soft tissue work at superior aspect of right medial and upper thigh in prone position for pain relief and muscle relaxation, gentle myofascial release at upper thigh, with releases today from far medial to lateral aspects of posterior thigh                  PT Short Term Goals -  02/12/16 1226      PT SHORT TERM GOAL #1   Title pain with walking decreased >/= 25%   Time 4   Period Weeks   Status Achieved           PT Long Term Goals - 06/24/16 1326      PT LONG TERM GOAL #1   Title indpendent with HEP   Time 8   Period Weeks   Status On-going  keep on progressing     PT LONG TERM GOAL #2   Title pain with walking decreased >/= 75%   Time 8   Period Weeks   Status On-going  depends on the day     PT LONG TERM GOAL #3   Title ability to flex her right hip with right groin pain decreased >/= 50% due  to improved tissue mobility   Time 8   Period Weeks   Status Achieved     PT LONG TERM GOAL #5   Title reduction of pain by end of day >/= 50% due to increase tissue mobility   Time 8   Period Weeks   Status On-going  30% depending on integrity of bone            Long Term Clinic Goals - 06/23/16 1126      CC Long Term Goal  #1   Title Reports left upper back and left side pain is controlled at 4/10 or less with therapy at the current frequency.   Status Achieved     CC Long Term Goal  #2   Title Pt. will report swelling is adequately managed to enable ADL function at a consistent level.   Status On-going     CC Long Term Goal  #3   Title Pain in area of right ischial tuberosity will be reduced to 1/10 or less, and patient will no longer need assistive device for ambulation.   Status Deferred     CC Long Term Goal  #4   Title Pain/discomfort at right axilla area will be controlled at 6/10 or less.   Baseline She has maintained this level for some weeks now.   Status On-going     CC Long Term Goal  #5   Title Patient will avoid infection by ongoing management of her lymphedema at right breast/axilla/upper arm areas.   Baseline She has not had any infection.   Status On-going     CC Long Term Goal  #6   Title Pain in right groin/ischial tuberosity area will be controlled at 3/10 level or less to enable walking with less pain.   Baseline This pain usually stays in the 2-3 range, but at times increases to 4/10 if she has been on her feet and more active.   Status On-going            Plan - 06/30/16 1308    Clinical Impression Statement Right fingers and hand a bit more swollen-looking today; right upper thigh tightness moderate.   Rehab Potential Good   Clinical Impairments Affecting Rehab Potential active cancer   PT Frequency 2x / week   PT Duration 12 weeks   PT Treatment/Interventions ADLs/Self Care Home Management;Therapeutic activities;Therapeutic  exercise;Patient/family education;Manual techniques;Manual lymph drainage;Scar mobilization   PT Next Visit Plan Continue manual lymph drainage, myofascial release, and soft tissue work.   Consulted and Agree with Plan of Care Patient      Patient will benefit from skilled therapeutic intervention in order to improve  the following deficits and impairments:  Increased edema, Increased fascial restricitons, Pain  Visit Diagnosis: Pain in right thigh  Lymphedema, not elsewhere classified     Problem List Patient Active Problem List   Diagnosis Date Noted  . Liver metastases ([place]) 02/23/2016  . 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 Zoster 04/04/2015  . Nausea with vomiting 11/18/2014  . Constipation 11/18/2014  . Left-sided thoracic back pain   . Bone metastases ([place]) 11/16/2014  . Back pain 11/15/2014  . Uncontrolled pain 11/14/2014  . Post-lymphadenectomy lymphedema of arm 05/31/2014  . Chest wall pain 03/21/2014  . Abnormal LFTs (liver function tests) 09/12/2013  . Malignant neoplasm of upper-inner quadrant of right breast in female, estrogen receptor positive ([place]) 08/18/2013  . 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

## 2016-07-01 LAB — CANCER ANTIGEN 27.29: CA 27.29: 4046.9 U/mL — ABNORMAL HIGH (ref 0.0–38.6)

## 2016-07-02 ENCOUNTER — Ambulatory Visit (HOSPITAL_BASED_OUTPATIENT_CLINIC_OR_DEPARTMENT_OTHER): Payer: 59

## 2016-07-02 VITALS — BP 118/79 | HR 85 | Temp 98.3°F | Resp 18

## 2016-07-02 DIAGNOSIS — C7951 Secondary malignant neoplasm of bone: Secondary | ICD-10-CM

## 2016-07-02 DIAGNOSIS — C50211 Malignant neoplasm of upper-inner quadrant of right female breast: Secondary | ICD-10-CM | POA: Diagnosis not present

## 2016-07-02 DIAGNOSIS — C771 Secondary and unspecified malignant neoplasm of intrathoracic lymph nodes: Secondary | ICD-10-CM

## 2016-07-02 DIAGNOSIS — Z5111 Encounter for antineoplastic chemotherapy: Secondary | ICD-10-CM | POA: Diagnosis not present

## 2016-07-02 MED ORDER — DENOSUMAB 120 MG/1.7ML ~~LOC~~ SOLN
120.0000 mg | Freq: Once | SUBCUTANEOUS | Status: AC
  Administered 2016-07-02: 120 mg via SUBCUTANEOUS
  Filled 2016-07-02: qty 1.7

## 2016-07-02 MED ORDER — FULVESTRANT 250 MG/5ML IM SOLN
500.0000 mg | Freq: Once | INTRAMUSCULAR | Status: AC
  Administered 2016-07-02: 500 mg via INTRAMUSCULAR
  Filled 2016-07-02: qty 10

## 2016-07-02 NOTE — Patient Instructions (Signed)
Denosumab injection What is this medicine? DENOSUMAB (den oh sue mab) slows bone breakdown. Prolia is used to treat osteoporosis in women after menopause and in men. Xgeva is used to prevent bone fractures and other bone problems caused by cancer bone metastases. Xgeva is also used to treat giant cell tumor of the bone. This medicine may be used for other purposes; ask your health care provider or pharmacist if you have questions. What should I tell my health care provider before I take this medicine? They need to know if you have any of these conditions: -dental disease -eczema -infection or history of infections -kidney disease or on dialysis -low blood calcium or vitamin D -malabsorption syndrome -scheduled to have surgery or tooth extraction -taking medicine that contains denosumab -thyroid or parathyroid disease -an unusual reaction to denosumab, other medicines, foods, dyes, or preservatives -pregnant or trying to get pregnant -breast-feeding How should I use this medicine? This medicine is for injection under the skin. It is given by a health care professional in a hospital or clinic setting. If you are getting Prolia, a special MedGuide will be given to you by the pharmacist with each prescription and refill. Be sure to read this information carefully each time. For Prolia, talk to your pediatrician regarding the use of this medicine in children. Special care may be needed. For Xgeva, talk to your pediatrician regarding the use of this medicine in children. While this drug may be prescribed for children as young as 13 years for selected conditions, precautions do apply. Overdosage: If you think you have taken too much of this medicine contact a poison control center or emergency room at once. NOTE: This medicine is only for you. Do not share this medicine with others. What if I miss a dose? It is important not to miss your dose. Call your doctor or health care professional if you are  unable to keep an appointment. What may interact with this medicine? Do not take this medicine with any of the following medications: -other medicines containing denosumab This medicine may also interact with the following medications: -medicines that suppress the immune system -medicines that treat cancer -steroid medicines like prednisone or cortisone This list may not describe all possible interactions. Give your health care provider a list of all the medicines, herbs, non-prescription drugs, or dietary supplements you use. Also tell them if you smoke, drink alcohol, or use illegal drugs. Some items may interact with your medicine. What should I watch for while using this medicine? Visit your doctor or health care professional for regular checks on your progress. Your doctor or health care professional may order blood tests and other tests to see how you are doing. Call your doctor or health care professional if you get a cold or other infection while receiving this medicine. Do not treat yourself. This medicine may decrease your body's ability to fight infection. You should make sure you get enough calcium and vitamin D while you are taking this medicine, unless your doctor tells you not to. Discuss the foods you eat and the vitamins you take with your health care professional. See your dentist regularly. Brush and floss your teeth as directed. Before you have any dental work done, tell your dentist you are receiving this medicine. Do not become pregnant while taking this medicine or for 5 months after stopping it. Women should inform their doctor if they wish to become pregnant or think they might be pregnant. There is a potential for serious side effects   to an unborn child. Talk to your health care professional or pharmacist for more information. What side effects may I notice from receiving this medicine? Side effects that you should report to your doctor or health care professional as soon as  possible: -allergic reactions like skin rash, itching or hives, swelling of the face, lips, or tongue -breathing problems -chest pain -fast, irregular heartbeat -feeling faint or lightheaded, falls -fever, chills, or any other sign of infection -muscle spasms, tightening, or twitches -numbness or tingling -skin blisters or bumps, or is dry, peels, or red -slow healing or unexplained pain in the mouth or jaw -unusual bleeding or bruising Side effects that usually do not require medical attention (Report these to your doctor or health care professional if they continue or are bothersome.): -muscle pain -stomach upset, gas This list may not describe all possible side effects. Call your doctor for medical advice about side effects. You may report side effects to FDA at 1-800-FDA-1088. Where should I keep my medicine? This medicine is only given in a clinic, doctor's office, or other health care setting and will not be stored at home. NOTE: This sheet is a summary. It may not cover all possible information. If you have questions about this medicine, talk to your doctor, pharmacist, or health care provider.    2016, Elsevier/Gold Standard. (2011-09-15 12:37:47) Fulvestrant injection What is this medicine? FULVESTRANT (ful VES trant) blocks the effects of estrogen. It is used to treat breast cancer. This medicine may be used for other purposes; ask your health care provider or pharmacist if you have questions. What should I tell my health care provider before I take this medicine? They need to know if you have any of these conditions: -bleeding problems -liver disease -low levels of platelets in the blood -an unusual or allergic reaction to fulvestrant, other medicines, foods, dyes, or preservatives -pregnant or trying to get pregnant -breast-feeding How should I use this medicine? This medicine is for injection into a muscle. It is usually given by a health care professional in a  hospital or clinic setting. Talk to your pediatrician regarding the use of this medicine in children. Special care may be needed. Overdosage: If you think you have taken too much of this medicine contact a poison control center or emergency room at once. NOTE: This medicine is only for you. Do not share this medicine with others. What if I miss a dose? It is important not to miss your dose. Call your doctor or health care professional if you are unable to keep an appointment. What may interact with this medicine? -medicines that treat or prevent blood clots like warfarin, enoxaparin, and dalteparin This list may not describe all possible interactions. Give your health care provider a list of all the medicines, herbs, non-prescription drugs, or dietary supplements you use. Also tell them if you smoke, drink alcohol, or use illegal drugs. Some items may interact with your medicine. What should I watch for while using this medicine? Your condition will be monitored carefully while you are receiving this medicine. You will need important blood work done while you are taking this medicine. Do not become pregnant while taking this medicine or for at least 1 year after stopping it. Women of child-bearing potential will need to have a negative pregnancy test before starting this medicine. Women should inform their doctor if they wish to become pregnant or think they might be pregnant. There is a potential for serious side effects to an unborn child. Men   should inform their doctors if they wish to father a child. This medicine may lower sperm counts. Talk to your health care professional or pharmacist for more information. Do not breast-feed an infant while taking this medicine or for 1 year after the last dose. What side effects may I notice from receiving this medicine? Side effects that you should report to your doctor or health care professional as soon as possible: -allergic reactions like skin rash,  itching or hives, swelling of the face, lips, or tongue -feeling faint or lightheaded, falls -pain, tingling, numbness, or weakness in the legs -signs and symptoms of infection like fever or chills; cough; flu-like symptoms; sore throat -vaginal bleeding Side effects that usually do not require medical attention (report to your doctor or health care professional if they continue or are bothersome): -aches, pains -constipation -diarrhea -headache -hot flashes -nausea, vomiting -pain at site where injected -stomach pain This list may not describe all possible side effects. Call your doctor for medical advice about side effects. You may report side effects to FDA at 1-800-FDA-1088. Where should I keep my medicine? This drug is given in a hospital or clinic and will not be stored at home. NOTE: This sheet is a summary. It may not cover all possible information. If you have questions about this medicine, talk to your doctor, pharmacist, or health care provider.    2016, Elsevier/Gold Standard. (2014-10-13 11:03:55)  

## 2016-07-04 ENCOUNTER — Ambulatory Visit: Payer: 59 | Admitting: Physical Therapy

## 2016-07-04 DIAGNOSIS — M62838 Other muscle spasm: Secondary | ICD-10-CM

## 2016-07-04 DIAGNOSIS — M79651 Pain in right thigh: Secondary | ICD-10-CM | POA: Diagnosis not present

## 2016-07-04 DIAGNOSIS — I890 Lymphedema, not elsewhere classified: Secondary | ICD-10-CM

## 2016-07-04 NOTE — Therapy (Signed)
[place], [place], [address] Phone: [phone]   Fax:  [phone]  Physical Therapy Treatment  Patient Details  Name: [name] [name] MRN: [id number] Date of Birth: 1960-04-13 Referring Provider: Dr. Kyung Rudd  Encounter Date: 07/04/2016      PT End of Session - 07/04/16 1235    Visit Number 79  28 for lymph   Number of Visits 11  for lymph   Date for PT Re-Evaluation 09/15/16   PT Start Time 1020   PT Stop Time 1105   PT Time Calculation (min) 45 min   Activity Tolerance Patient tolerated treatment well   Behavior During Therapy [facility] for tasks assessed/performed      Past Medical History:  Diagnosis Date  . Bone metastases ([place]) dx'd 05/2014  . Breast cancer ([place]) dx'd 2005/2011  . Peripheral vascular disease ([place]) 02/2010   blood clot related to porta cath  . PONV (postoperative nausea and vomiting)   . S/P radiation therapy 07/17/2014 through 08/02/2014    Left mediastinum, left seventh rib 3250 cGy in 13 sessions   . S/P radiation therapy 12/11/2014 through 12/22/2014    Left parietal calvarium 2400 cGy in 8 sessions   . Seizures ([place]) 2010   Isolated incident.    Past Surgical History:  Procedure Laterality Date  . AXILLARY LYMPH NODE DISSECTION  Dec. 2011  . BREAST LUMPECTOMY  2005  . IR GENERIC HISTORICAL  05/21/2016   IR RADIOLOGIST EVAL & MGMT 05/21/2016 Sandi Mariscal, MD [facility] INTERV RAD  . MEDIASTINOTOMY CHAMBERLAIN MCNEIL Left 06/02/2013   Procedure: MEDIASTINOTOMY CHAMBERLAIN MCNEIL;  Surgeon: [name], MD;  Location: [facility];  Service: Thoracic;  Laterality: Left;  LEFT ANTERIOR MEDIASTINOTOMY   . PORTACATH PLACEMENT  12/11  . removal portacath      There were no vitals filed for this visit.      Subjective  Assessment - 07/04/16 1233    Subjective "I did something I shouldn't have and my leg is really tight"    Currently in Pain? Yes   Pain Score 5    Pain Location Groin   Pain Orientation Right   Pain Descriptors / Indicators Tightness   Pain Type Chronic pain                         [facility] Adult PT Treatment/Exercise - 07/04/16 0001      Manual Therapy   Myofascial Release right axilla with focus on scar tightness   Manual Lymphatic Drainage (MLD) In left sidelying, posterior interaxillary anastomosis and right axillo-inguinal anastomosis; in supine, short neck, left axilla and anterior interaxillary anastomosis, right groin and axillo-inguinal anastomosis, and right upper extremity from fingers to shoulder.  In right sidelying, left periscapular area toward left groin   Other Manual Therapy soft tissue work at superior aspect of right medial and upper thigh in prone position for pain relief and muscle relaxation, gentle myofascial release at upper thigh, with releases today from far medial to lateral aspects of posterior thigh                  PT Short Term Goals - 02/12/16 1226      PT SHORT TERM GOAL #1   Title pain with walking decreased >/= 25%   Time 4   Period Weeks   Status Achieved           PT Long Term Goals - 06/24/16  [place] #1   Title indpendent with HEP   Time 8   Period Weeks   Status On-going  keep on progressing     PT LONG TERM GOAL #2   Title pain with walking decreased >/= 75%   Time 8   Period Weeks   Status On-going  depends on the day     PT LONG TERM GOAL #3   Title ability to flex her right hip with right groin pain decreased >/= 50% due to improved tissue mobility   Time 8   Period Weeks   Status Achieved     PT LONG TERM GOAL #5   Title reduction of pain by end of day >/= 50% due to increase tissue mobility   Time 8   Period Weeks   Status On-going  30% depending on integrity of bone             Long Term Clinic Goals - 06/23/16 1126      CC Long Term Goal  #1   Title Reports left upper back and left side pain is controlled at 4/10 or less with therapy at the current frequency.   Status Achieved     CC Long Term Goal  #2   Title Pt. will report swelling is adequately managed to enable ADL function at a consistent level.   Status On-going     CC Long Term Goal  #3   Title Pain in area of right ischial tuberosity will be reduced to 1/10 or less, and patient will no longer need assistive device for ambulation.   Status Deferred     CC Long Term Goal  #4   Title Pain/discomfort at right axilla area will be controlled at 6/10 or less.   Baseline She has maintained this level for some weeks now.   Status On-going     CC Long Term Goal  #5   Title Patient will avoid infection by ongoing management of her lymphedema at right breast/axilla/upper arm areas.   Baseline She has not had any infection.   Status On-going     CC Long Term Goal  #6   Title Pain in right groin/ischial tuberosity area will be controlled at 3/10 level or less to enable walking with less pain.   Baseline This pain usually stays in the 2-3 range, but at times increases to 4/10 if she has been on her feet and more active.   Status On-going            Plan - 07/04/16 1236    Clinical Impression Statement Pt states she got relief from treatment, especially to her thigh.  she feels she will able to be able to walk much better this weekend because of it.    Rehab Potential Good   Clinical Impairments Affecting Rehab Potential active cancer   PT Frequency 2x / week   PT Duration 12 weeks   PT Treatment/Interventions ADLs/Self Care Home Management;Therapeutic activities;Therapeutic exercise;Patient/family education;Manual techniques;Manual lymph drainage;Scar mobilization   PT Next Visit Plan Continue manual lymph drainage, myofascial release, and soft tissue work.      Patient will benefit  from skilled therapeutic intervention in order to improve the following deficits and impairments:     Visit Diagnosis: Pain in right thigh  Lymphedema, not elsewhere classified  Other muscle spasm     Problem List Patient Active Problem List   Diagnosis Date Noted  . Liver metastases (  [place]) 02/23/2016  . 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 Zoster 04/04/2015  . Nausea with vomiting 11/18/2014  . Constipation 11/18/2014  . Left-sided thoracic back pain   . Bone metastases ([place]) 11/16/2014  . Back pain 11/15/2014  . Uncontrolled pain 11/14/2014  . Post-lymphadenectomy lymphedema of arm 05/31/2014  . Chest wall pain 03/21/2014  . Abnormal LFTs (liver function tests) 09/12/2013  . Malignant neoplasm of upper-inner quadrant of right breast in female, estrogen receptor positive ([place]) 08/18/2013  . 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 Owens Shark PT  [name] 07/04/2016, 12:39 PM  [place], [place], [address] Phone: [phone]   Fax:  [phone]  Name: SHAWONDA KERCE MRN: [id number] Date of Birth: 1961/03/19

## 2016-07-07 ENCOUNTER — Telehealth: Payer: Self-pay | Admitting: Oncology

## 2016-07-07 ENCOUNTER — Other Ambulatory Visit: Payer: Self-pay | Admitting: *Deleted

## 2016-07-07 ENCOUNTER — Ambulatory Visit: Payer: 59

## 2016-07-07 ENCOUNTER — Other Ambulatory Visit: Payer: Self-pay | Admitting: Oncology

## 2016-07-07 ENCOUNTER — Other Ambulatory Visit (HOSPITAL_BASED_OUTPATIENT_CLINIC_OR_DEPARTMENT_OTHER): Payer: 59

## 2016-07-07 DIAGNOSIS — M79651 Pain in right thigh: Secondary | ICD-10-CM

## 2016-07-07 DIAGNOSIS — Z170 Estrogen receptor positive status [ER+]: Principal | ICD-10-CM

## 2016-07-07 DIAGNOSIS — C7951 Secondary malignant neoplasm of bone: Secondary | ICD-10-CM

## 2016-07-07 DIAGNOSIS — C771 Secondary and unspecified malignant neoplasm of intrathoracic lymph nodes: Secondary | ICD-10-CM

## 2016-07-07 DIAGNOSIS — C50211 Malignant neoplasm of upper-inner quadrant of right female breast: Secondary | ICD-10-CM

## 2016-07-07 DIAGNOSIS — R7989 Other specified abnormal findings of blood chemistry: Secondary | ICD-10-CM

## 2016-07-07 DIAGNOSIS — R945 Abnormal results of liver function studies: Secondary | ICD-10-CM

## 2016-07-07 DIAGNOSIS — C787 Secondary malignant neoplasm of liver and intrahepatic bile duct: Secondary | ICD-10-CM

## 2016-07-07 DIAGNOSIS — M62838 Other muscle spasm: Secondary | ICD-10-CM

## 2016-07-07 DIAGNOSIS — I890 Lymphedema, not elsewhere classified: Secondary | ICD-10-CM

## 2016-07-07 LAB — CBC WITH DIFFERENTIAL/PLATELET
BASO%: 1.1 % (ref 0.0–2.0)
BASOS ABS: 0 10*3/uL (ref 0.0–0.1)
EOS ABS: 0.1 10*3/uL (ref 0.0–0.5)
EOS%: 1.8 % (ref 0.0–7.0)
HEMATOCRIT: 33.1 % — AB (ref 34.8–46.6)
HGB: 11.9 g/dL (ref 11.6–15.9)
LYMPH%: 24 % (ref 14.0–49.7)
MCH: 37.8 pg — ABNORMAL HIGH (ref 25.1–34.0)
MCHC: 36 g/dL (ref 31.5–36.0)
MCV: 105.1 fL — AB (ref 79.5–101.0)
MONO#: 0.2 10*3/uL (ref 0.1–0.9)
MONO%: 8.7 % (ref 0.0–14.0)
NEUT#: 1.8 10*3/uL (ref 1.5–6.5)
NEUT%: 64.4 % (ref 38.4–76.8)
PLATELETS: 244 10*3/uL (ref 145–400)
RBC: 3.15 10*6/uL — AB (ref 3.70–5.45)
RDW: 13.2 % (ref 11.2–14.5)
WBC: 2.8 10*3/uL — ABNORMAL LOW (ref 3.9–10.3)
lymph#: 0.7 10*3/uL — ABNORMAL LOW (ref 0.9–3.3)
nRBC: 0 % (ref 0–0)

## 2016-07-07 NOTE — Therapy (Signed)
[place], [place], [address] Phone: [phone]   Fax:  [phone]  Physical Therapy Treatment  Patient Details  Name: [name] [name] MRN: [id number] Date of Birth: 06-Nov-1960 Referring Provider: Dr. Kyung Rudd  Encounter Date: 07/07/2016      PT End of Session - 07/07/16 1157    Visit Number 42  29 for lymph   Number of Visits 33  for lymph   Date for PT Re-Evaluation 09/15/16   PT Start Time 1018   PT Stop Time 1100   PT Time Calculation (min) 42 min   Activity Tolerance Patient tolerated treatment well   Behavior During Therapy [facility] for tasks assessed/performed      Past Medical History:  Diagnosis Date  . Bone metastases ([place]) dx'd 05/2014  . Breast cancer ([place]) dx'd 2005/2011  . Peripheral vascular disease ([place]) 02/2010   blood clot related to porta cath  . PONV (postoperative nausea and vomiting)   . S/P radiation therapy 07/17/2014 through 08/02/2014    Left mediastinum, left seventh rib 3250 cGy in 13 sessions   . S/P radiation therapy 12/11/2014 through 12/22/2014    Left parietal calvarium 2400 cGy in 8 sessions   . Seizures ([place]) 2010   Isolated incident.    Past Surgical History:  Procedure Laterality Date  . AXILLARY LYMPH NODE DISSECTION  Dec. 2011  . BREAST LUMPECTOMY  2005  . IR GENERIC HISTORICAL  05/21/2016   IR RADIOLOGIST EVAL & MGMT 05/21/2016 Sandi Mariscal, MD [facility] INTERV RAD  . MEDIASTINOTOMY CHAMBERLAIN MCNEIL Left 06/02/2013   Procedure: MEDIASTINOTOMY CHAMBERLAIN MCNEIL;  Surgeon: [name], MD;  Location: [facility];  Service: Thoracic;  Laterality: Left;  LEFT ANTERIOR MEDIASTINOTOMY   . PORTACATH PLACEMENT  12/11  . removal portacath      There were no vitals filed for this visit.      Subjective  Assessment - 07/07/16 1025    Subjective These appointments really help carry me through and help me to walk more upright with less pain and keep my swelling under control.   How long can you walk comfortably? more she walks patient will have increased pain that stops her from walking further   Diagnostic tests x rays of back show no fractures;    Patient Stated Goals walk 20 minutes with minimal limp   Currently in Pain? Yes   Pain Score 3    Pain Location Groin   Pain Orientation Right   Pain Descriptors / Indicators Other (Comment);Tightness   Pain Type Chronic pain   Pain Onset More than a month ago   Pain Frequency Constant   Aggravating Factors  sitting in one place too long or walking too long   Pain Relieving Factors therapy   Effect of Pain on Daily Activities walking   Multiple Pain Sites Yes   Pain Score 6   Pain Location Axilla   Pain Orientation Right   Pain Descriptors / Indicators Other (Comment)  Heavy   Pain Type Chronic pain   Pain Onset More than a month ago   Pain Frequency Constant   Aggravating Factors  increased swelling/no therapy   Pain Relieving Factors therapy                         [facility] Adult PT Treatment/Exercise - 07/07/16 0001      Manual Therapy   Myofascial Release right axilla with focus on scar  tightness   Manual Lymphatic Drainage (MLD) In left sidelying, posterior interaxillary anastomosis and right axillo-inguinal anastomosis; in supine, short neck, left axilla and anterior interaxillary anastomosis, right groin and axillo-inguinal anastomosis, and right upper extremity from fingers to shoulder.  In right sidelying, left periscapular area toward left groin   Other Manual Therapy soft tissue work at superior aspect of right medial and upper thigh in prone position for pain relief and muscle relaxation, gentle myofascial release at upper thigh, with releases today from far medial to lateral aspects of posterior thigh                   PT Short Term Goals - 02/12/16 1226      PT SHORT TERM GOAL #1   Title pain with walking decreased >/= 25%   Time 4   Period Weeks   Status Achieved           PT Long Term Goals - 06/24/16 1326      PT LONG TERM GOAL #1   Title indpendent with HEP   Time 8   Period Weeks   Status On-going  keep on progressing     PT LONG TERM GOAL #2   Title pain with walking decreased >/= 75%   Time 8   Period Weeks   Status On-going  depends on the day     PT LONG TERM GOAL #3   Title ability to flex her right hip with right groin pain decreased >/= 50% due to improved tissue mobility   Time 8   Period Weeks   Status Achieved     PT LONG TERM GOAL #5   Title reduction of pain by end of day >/= 50% due to increase tissue mobility   Time 8   Period Weeks   Status On-going  30% depending on integrity of bone            Long Term Clinic Goals - 06/23/16 1126      CC Long Term Goal  #1   Title Reports left upper back and left side pain is controlled at 4/10 or less with therapy at the current frequency.   Status Achieved     CC Long Term Goal  #2   Title Pt. will report swelling is adequately managed to enable ADL function at a consistent level.   Status On-going     CC Long Term Goal  #3   Title Pain in area of right ischial tuberosity will be reduced to 1/10 or less, and patient will no longer need assistive device for ambulation.   Status Deferred     CC Long Term Goal  #4   Title Pain/discomfort at right axilla area will be controlled at 6/10 or less.   Baseline She has maintained this level for some weeks now.   Status On-going     CC Long Term Goal  #5   Title Patient will avoid infection by ongoing management of her lymphedema at right breast/axilla/upper arm areas.   Baseline She has not had any infection.   Status On-going     CC Long Term Goal  #6   Title Pain in right groin/ischial tuberosity area will be controlled at 3/10  level or less to enable walking with less pain.   Baseline This pain usually stays in the 2-3 range, but at times increases to 4/10 if she has been on her feet and more active.   Status On-going  Plan - 07/07/16 1152    Clinical Impression Statement Pt continues with relief after treatment and she reports this is "helping get her through" with less pain and less compensatory techniques.    Rehab Potential Good   Clinical Impairments Affecting Rehab Potential active cancer   PT Frequency 2x / week   PT Duration 12 weeks   PT Treatment/Interventions ADLs/Self Care Home Management;Therapeutic activities;Therapeutic exercise;Patient/family education;Manual techniques;Manual lymph drainage;Scar mobilization   PT Next Visit Plan Continue manual lymph drainage, myofascial release, and soft tissue work.   PT Home Exercise Plan progress as needed   Consulted and Agree with Plan of Care Patient      Patient will benefit from skilled therapeutic intervention in order to improve the following deficits and impairments:  Increased edema, Increased fascial restricitons, Pain  Visit Diagnosis: Pain in right thigh  Lymphedema, not elsewhere classified  Other muscle spasm     Problem List Patient Active Problem List   Diagnosis Date Noted  . Liver metastases ([place]) 02/23/2016  . 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 Zoster 04/04/2015  . Nausea with vomiting 11/18/2014  . Constipation 11/18/2014  . Left-sided thoracic back pain   . Bone metastases ([place]) 11/16/2014  . Back pain 11/15/2014  . Uncontrolled pain 11/14/2014  . Post-lymphadenectomy lymphedema of arm 05/31/2014  . Chest wall pain 03/21/2014  . Abnormal LFTs (liver function tests) 09/12/2013  . Malignant neoplasm of upper-inner quadrant of right breast in female, estrogen receptor positive ([place]) 08/18/2013  . 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

## 2016-07-07 NOTE — Telephone Encounter (Signed)
Patient sent to scheduling today after speaking with GM to reschedule appointments (no office visit on schedule). Per desk nurse moved 4/12 iv access and mri to 4/26 and 4/20 f/u with GM to 4/27 @ 1:30 pm. Patient has new appointment dates/times. Mri rescheduled with Calvert Cantor at central radi[facility].

## 2016-07-08 ENCOUNTER — Encounter: Payer: Self-pay | Admitting: Physical Therapy

## 2016-07-08 ENCOUNTER — Ambulatory Visit: Payer: 59 | Attending: Radiation Oncology | Admitting: Physical Therapy

## 2016-07-08 DIAGNOSIS — M25551 Pain in right hip: Secondary | ICD-10-CM | POA: Diagnosis present

## 2016-07-08 DIAGNOSIS — M62838 Other muscle spasm: Secondary | ICD-10-CM | POA: Insufficient documentation

## 2016-07-08 DIAGNOSIS — M79651 Pain in right thigh: Secondary | ICD-10-CM | POA: Diagnosis present

## 2016-07-08 NOTE — Therapy (Signed)
[facility] [address], [place], [place], [address] Phone: [phone]   Fax:  [phone]  Physical Therapy Treatment  Patient Details  Name: [name] [name] MRN: [id number] Date of Birth: Apr 16, 1960 Referring Provider: Dr. Kyung Rudd  Encounter Date: 07/08/2016      PT End of Session - 07/08/16 1311    Visit Number 41   Date for PT Re-Evaluation 09/15/16   Authorization - Visit Number 47   Authorization - Number of Visits 140   PT Start Time 1232   PT Stop Time 1315   PT Time Calculation (min) 43 min   Activity Tolerance Patient tolerated treatment well   Behavior During Therapy [facility] for tasks assessed/performed      Past Medical History:  Diagnosis Date  . Bone metastases ([place]) dx'd 05/2014  . Breast cancer ([place]) dx'd 2005/2011  . Peripheral vascular disease ([place]) 02/2010   blood clot related to porta cath  . PONV (postoperative nausea and vomiting)   . S/P radiation therapy 07/17/2014 through 08/02/2014    Left mediastinum, left seventh rib 3250 cGy in 13 sessions   . S/P radiation therapy 12/11/2014 through 12/22/2014    Left parietal calvarium 2400 cGy in 8 sessions   . Seizures ([place]) 2010   Isolated incident.    Past Surgical History:  Procedure Laterality Date  . AXILLARY LYMPH NODE DISSECTION  Dec. 2011  . BREAST LUMPECTOMY  2005  . IR GENERIC HISTORICAL  05/21/2016   IR RADIOLOGIST EVAL & MGMT 05/21/2016 Sandi Mariscal, MD [facility] INTERV RAD  . MEDIASTINOTOMY CHAMBERLAIN MCNEIL Left 06/02/2013   Procedure: MEDIASTINOTOMY CHAMBERLAIN MCNEIL;  Surgeon: [name], MD;  Location: [place];  Service: Thoracic;  Laterality: Left;  LEFT ANTERIOR MEDIASTINOTOMY   . PORTACATH PLACEMENT  12/11  . removal portacath      There were no vitals filed  for this visit.      Subjective Assessment - 07/08/16 1240    Subjective My urine still splashes.  If things are doing better I would like to come every other week.  I am still functioning.    How long can you walk comfortably? more she walks patient will have increased pain that stops her from walking further   Diagnostic tests x rays of back show no fractures;    Patient Stated Goals walk 20 minutes with minimal limp   Currently in Pain? Yes   Pain Score 3    Pain Location Groin   Pain Orientation Right   Pain Descriptors / Indicators Tightness   Pain Type Chronic pain   Pain Radiating Towards right upper thigh   Aggravating Factors  moving wrong   Pain Relieving Factors soft tissue work   Effect of Pain on Daily Activities walking   Multiple Pain Sites No                      Pelvic Floor Special Questions - 07/08/16 0001    Pelvic Floor Internal Exam Patient approves physical therapist to perform pelvic floor muscle assessment   Exam Type Vaginal           [facility] Adult PT Treatment/Exercise - 07/08/16 0001      Manual Therapy   Manual Therapy Soft tissue mobilization;Internal Pelvic Floor   Soft tissue mobilization right hamstring, hip adductors, and quads   Internal Pelvic Floor bilateral sides of urethra on urethra sphincter, post. intoitus  with gently pressure releasing restrictions  PT Education - 07/08/16 1311    Education provided No          PT Short Term Goals - 02/12/16 1226      PT SHORT TERM GOAL #1   Title pain with walking decreased >/= 25%   Time 4   Period Weeks   Status Achieved           PT Long Term Goals - 07/08/16 1324      PT LONG TERM GOAL #1   Title indpendent with HEP   Time 8   Period Weeks   Status On-going     PT LONG TERM GOAL #2   Title pain with walking decreased >/= 75%   Time 8   Period Weeks   Status On-going     PT LONG TERM GOAL #3   Title ability to flex her right hip  with right groin pain decreased >/= 50% due to improved tissue mobility   Time 8   Period Weeks   Status Achieved     PT LONG TERM GOAL #4   Title waking up in middle of night with pain decreased >/=50%   Time 8   Period Weeks   Status Achieved     PT LONG TERM GOAL #5   Title reduction of pain by end of day >/= 50% due to increase tissue mobility   Time 8   Period Weeks   Status On-going           Long Term Clinic Goals - 06/23/16 1126      CC Long Term Goal  #1   Title Reports left upper back and left side pain is controlled at 4/10 or less with therapy at the current frequency.   Status Achieved     CC Long Term Goal  #2   Title Pt. will report swelling is adequately managed to enable ADL function at a consistent level.   Status On-going     CC Long Term Goal  #3   Title Pain in area of right ischial tuberosity will be reduced to 1/10 or less, and patient will no longer need assistive device for ambulation.   Status Deferred     CC Long Term Goal  #4   Title Pain/discomfort at right axilla area will be controlled at 6/10 or less.   Baseline She has maintained this level for some weeks now.   Status On-going     CC Long Term Goal  #5   Title Patient will avoid infection by ongoing management of her lymphedema at right breast/axilla/upper arm areas.   Baseline She has not had any infection.   Status On-going     CC Long Term Goal  #6   Title Pain in right groin/ischial tuberosity area will be controlled at 3/10 level or less to enable walking with less pain.   Baseline This pain usually stays in the 2-3 range, but at times increases to 4/10 if she has been on her feet and more active.   Status On-going            Plan - 07/08/16 1321    Clinical Impression Statement Patient had improved pelvic floor tissue mobility internally. Patient reports her urine stream has improved by 60%  with no back splash.  Patient is feeling decreased in tightness after soft  tissue work and helps her to continue to walk.  Patient will be coming in 2 weeks due to the tissue being more pliable.  Rehab Potential Good   Clinical Impairments Affecting Rehab Potential active cancer   PT Frequency 2x / week   PT Duration 8 weeks   PT Treatment/Interventions ADLs/Self Care Home Management;Therapeutic activities;Therapeutic exercise;Patient/family education;Manual techniques;Manual lymph drainage;Scar mobilization   PT Next Visit Plan myofascial release, and soft tissue work.   PT Home Exercise Plan progress as needed   Consulted and Agree with Plan of Care Patient      Patient will benefit from skilled therapeutic intervention in order to improve the following deficits and impairments:  Increased edema, Increased fascial restricitons, Pain  Visit Diagnosis: Pain in right thigh  Other muscle spasm     Problem List Patient Active Problem List   Diagnosis Date Noted  . Liver metastases ([place]) 02/23/2016  . 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 Zoster 04/04/2015  . Nausea with vomiting 11/18/2014  . Constipation 11/18/2014  . Left-sided thoracic back pain   . Bone metastases ([place]) 11/16/2014  . Back pain 11/15/2014  . Uncontrolled pain 11/14/2014  . 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 Chest wall pain 03/21/2014  . Abnormal LFTs (liver function tests) 09/12/2013  . Malignant neoplasm of upper-inner quadrant of right breast in female, estrogen receptor positive ([name]) 08/18/2013  . 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 [place], [place], [address] Phone: [phone]   Fax:  [phone]  Name: [name] [name] MRN: [id number] Date of Birth: 16-Jun-1960

## 2016-07-10 ENCOUNTER — Ambulatory Visit: Payer: 59

## 2016-07-10 ENCOUNTER — Ambulatory Visit (HOSPITAL_COMMUNITY): Payer: 59

## 2016-07-11 ENCOUNTER — Ambulatory Visit: Payer: 59 | Admitting: Physical Therapy

## 2016-07-11 DIAGNOSIS — M79651 Pain in right thigh: Secondary | ICD-10-CM | POA: Diagnosis not present

## 2016-07-11 DIAGNOSIS — I890 Lymphedema, not elsewhere classified: Secondary | ICD-10-CM

## 2016-07-11 NOTE — Therapy (Signed)
[place], [place], [address] Phone: [phone]   Fax:  [phone]  Physical Therapy Treatment  Patient Details  Name: [name] [name] MRN: [id number] Date of Birth: 1960/12/23 Referring Provider: Dr. Kyung Rudd  Encounter Date: 07/11/2016      PT End of Session - 07/11/16 1123    Visit Number 62  30 for lymph   Number of Visits 67  for lymph   Date for PT Re-Evaluation 09/15/16   Authorization - Visit Number 46   Authorization - Number of Visits 140   PT Start Time 1025   PT Stop Time 1114   PT Time Calculation (min) 49 min   Activity Tolerance Patient tolerated treatment well   Behavior During Therapy [facility] for tasks assessed/performed      Past Medical History:  Diagnosis Date  . Bone metastases ([place]) dx'd 05/2014  . Breast cancer ([place]) dx'd 2005/2011  . Peripheral vascular disease ([place]) 02/2010   blood clot related to porta cath  . PONV (postoperative nausea and vomiting)   . S/P radiation therapy 07/17/2014 through 08/02/2014    Left mediastinum, left seventh rib 3250 cGy in 13 sessions   . S/P radiation therapy 12/11/2014 through 12/22/2014    Left parietal calvarium 2400 cGy in 8 sessions   . Seizures ([place]) 2010   Isolated incident.    Past Surgical History:  Procedure Laterality Date  . AXILLARY LYMPH NODE DISSECTION  Dec. 2011  . BREAST LUMPECTOMY  2005  . IR GENERIC HISTORICAL  05/21/2016   IR RADIOLOGIST EVAL & MGMT 05/21/2016 Sandi Mariscal, MD [facility] INTERV RAD  . MEDIASTINOTOMY CHAMBERLAIN MCNEIL Left 06/02/2013   Procedure: MEDIASTINOTOMY CHAMBERLAIN MCNEIL;  Surgeon: [name], MD;  Location: [place];  Service: Thoracic;  Laterality: Left;  LEFT ANTERIOR MEDIASTINOTOMY   . PORTACATH PLACEMENT  12/11  . removal  portacath      There were no vitals filed for this visit.      Subjective Assessment - 07/11/16 1029    Subjective "Yesterday I wore a ring for a little bit."   Currently in Pain? Yes   Pain Score 3   2-3   Pain Location Groin   Pain Orientation Right   Pain Descriptors / Indicators Tightness   Pain Score 6   Pain Location Axilla   Pain Orientation Right   Aggravating Factors  more swelling   Pain Relieving Factors therapy                         [facility] Adult PT Treatment/Exercise - 07/11/16 0001      Manual Therapy   Myofascial Release right axilla with focus on scar tightness   Manual Lymphatic Drainage (MLD) In left sidelying, posterior interaxillary anastomosis and right axillo-inguinal anastomosis; in supine, short neck, left axilla and anterior interaxillary anastomosis, right groin and axillo-inguinal anastomosis, and right upper extremity from fingers to shoulder.  In right sidelying, left periscapular area toward left groin   Other Manual Therapy soft tissue work at superior aspect of right medial and upper thigh in prone position for pain relief and muscle relaxation, gentle myofascial release at upper thigh, with releases today from far medial to lateral aspects of posterior thigh                  PT Short Term Goals - 02/12/16 1226      PT SHORT TERM GOAL #1  Title pain with walking decreased >/= 25%   Time 4   Period Weeks   Status Achieved           PT Long Term Goals - 07/08/16 1324      PT LONG TERM GOAL #1   Title indpendent with HEP   Time 8   Period Weeks   Status On-going     PT LONG TERM GOAL #2   Title pain with walking decreased >/= 75%   Time 8   Period Weeks   Status On-going     PT LONG TERM GOAL #3   Title ability to flex her right hip with right groin pain decreased >/= 50% due to improved tissue mobility   Time 8   Period Weeks   Status Achieved     PT LONG TERM GOAL #4   Title waking up in middle  of night with pain decreased >/=50%   Time 8   Period Weeks   Status Achieved     PT LONG TERM GOAL #5   Title reduction of pain by end of day >/= 50% due to increase tissue mobility   Time 8   Period Weeks   Status On-going           Long Term Clinic Goals - 07/11/16 1128      CC Long Term Goal  #2   Title Pt. will report swelling is adequately managed to enable ADL function at a consistent level.   Status On-going     CC Long Term Goal  #4   Title Pain/discomfort at right axilla area will be controlled at 6/10 or less.   Status On-going     CC Long Term Goal  #5   Title Patient will avoid infection by ongoing management of her lymphedema at right breast/axilla/upper arm areas.   Status On-going     CC Long Term Goal  #6   Title Pain in right groin/ischial tuberosity area will be controlled at 3/10 level or less to enable walking with less pain.   Status On-going            Plan - 07/11/16 1125    Clinical Impression Statement Ongoing lymphedema with discomfort and right thigh muscle tightness with discomfort continue to benefit from therapy, with patient noting relief at end of each session, and maintaining functional level overall by getting therapy 2-3x/week.   Rehab Potential Good   Clinical Impairments Affecting Rehab Potential active cancer   PT Frequency 2x / week   PT Duration 12 weeks   PT Treatment/Interventions ADLs/Self Care Home Management;Therapeutic activities;Therapeutic exercise;Patient/family education;Manual techniques;Manual lymph drainage;Scar mobilization   PT Next Visit Plan manual lymph drainage, myofascial release, and soft tissue work.   Consulted and Agree with Plan of Care Patient      Patient will benefit from skilled therapeutic intervention in order to improve the following deficits and impairments:  Increased edema, Increased fascial restricitons, Pain  Visit Diagnosis: Pain in right thigh  Lymphedema, not elsewhere  classified     Problem List Patient Active Problem List   Diagnosis Date Noted  . Liver metastases ([place]) 02/23/2016  . 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 Zoster 04/04/2015  . Nausea with vomiting 11/18/2014  . Constipation 11/18/2014  . Left-sided thoracic back pain   . Bone metastases ([place]) 11/16/2014  . Back pain 11/15/2014  . Uncontrolled pain 11/14/2014  . Post-lymphadenectomy lymphedema of arm 05/31/2014  . Chest wall pain 03/21/2014  .  Abnormal LFTs (liver function tests) 09/12/2013  . Malignant neoplasm of upper-inner quadrant of right breast in female, estrogen receptor positive ([place]) 08/18/2013  . 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

## 2016-07-14 ENCOUNTER — Other Ambulatory Visit: Payer: 59

## 2016-07-14 ENCOUNTER — Ambulatory Visit: Payer: 59 | Admitting: Physical Therapy

## 2016-07-14 DIAGNOSIS — I890 Lymphedema, not elsewhere classified: Secondary | ICD-10-CM

## 2016-07-14 DIAGNOSIS — M79651 Pain in right thigh: Secondary | ICD-10-CM | POA: Diagnosis not present

## 2016-07-14 NOTE — Therapy (Signed)
[place], [place], [address] Phone: [phone]   Fax:  [phone]  Physical Therapy Treatment  Patient Details  Name: [name] [name] MRN: [id number] Date of Birth: Mar 04, 1961 Referring Provider: Dr. Kyung Rudd  Encounter Date: 07/14/2016      PT End of Session - 07/14/16 1634    Visit Number 15  31 for lymph   Number of Visits 79  for lymph   Date for PT Re-Evaluation 09/15/16   Authorization - Visit Number 54   Authorization - Number of Visits 140  for 2018   PT Start Time 0350   PT Stop Time 1616   PT Time Calculation (min) 52 min   Activity Tolerance Patient tolerated treatment well   Behavior During Therapy [facility] for tasks assessed/performed      Past Medical History:  Diagnosis Date  . Bone metastases ([place]) dx'd 05/2014  . Breast cancer ([place]) dx'd 2005/2011  . Peripheral vascular disease ([place]) 02/2010   blood clot related to porta cath  . PONV (postoperative nausea and vomiting)   . S/P radiation therapy 07/17/2014 through 08/02/2014    Left mediastinum, left seventh rib 3250 cGy in 13 sessions   . S/P radiation therapy 12/11/2014 through 12/22/2014    Left parietal calvarium 2400 cGy in 8 sessions   . Seizures ([place]) 2010   Isolated incident.    Past Surgical History:  Procedure Laterality Date  . AXILLARY LYMPH NODE DISSECTION  Dec. 2011  . BREAST LUMPECTOMY  2005  . IR GENERIC HISTORICAL  05/21/2016   IR RADIOLOGIST EVAL & MGMT 05/21/2016 Sandi Mariscal, MD [facility] INTERV RAD  . MEDIASTINOTOMY CHAMBERLAIN MCNEIL Left 06/02/2013   Procedure: MEDIASTINOTOMY CHAMBERLAIN MCNEIL;  Surgeon: [name], MD;  Location: [place];  Service: Thoracic;  Laterality: Left;  LEFT ANTERIOR MEDIASTINOTOMY   . PORTACATH PLACEMENT  12/11  .  removal portacath      There were no vitals filed for this visit.      Subjective Assessment - 07/14/16 1526    Subjective Jim and my anniversary yesterday. Had family visiting this weekend; was on her feet a lot Saturday, so groin/thigh pain is worse.   Currently in Pain? Yes   Pain Score 4   3-4   Pain Location Groin   Pain Orientation Right   Pain Descriptors / Indicators Tightness   Pain Type Chronic pain   Aggravating Factors  walking more   Pain Relieving Factors soft tissue work   Pain Score 6   Pain Location Axilla   Pain Orientation Right   Aggravating Factors  more swelling   Pain Relieving Factors therapy                         [facility] Adult PT Treatment/Exercise - 07/14/16 0001      Manual Therapy   Myofascial Release right axilla with focus on scar tightness   Manual Lymphatic Drainage (MLD) In left sidelying, posterior interaxillary anastomosis and right axillo-inguinal anastomosis; in supine, short neck, left axilla and anterior interaxillary anastomosis, right groin and axillo-inguinal anastomosis, and right upper extremity from fingers to shoulder.  In right sidelying, left periscapular area toward left groin   Other Manual Therapy soft tissue work at superior aspect of right medial and upper thigh in prone position for pain relief and muscle relaxation, gentle myofascial release at upper thigh, with releases today from far medial to lateral aspects of posterior  thigh                  PT Short Term Goals - 02/12/16 1226      PT SHORT TERM GOAL #1   Title pain with walking decreased >/= 25%   Time 4   Period Weeks   Status Achieved           PT Long Term Goals - 07/08/16 1324      PT LONG TERM GOAL #1   Title indpendent with HEP   Time 8   Period Weeks   Status On-going     PT LONG TERM GOAL #2   Title pain with walking decreased >/= 75%   Time 8   Period Weeks   Status On-going     PT LONG TERM GOAL #3   Title  ability to flex her right hip with right groin pain decreased >/= 50% due to improved tissue mobility   Time 8   Period Weeks   Status Achieved     PT LONG TERM GOAL #4   Title waking up in middle of night with pain decreased >/=50%   Time 8   Period Weeks   Status Achieved     PT LONG TERM GOAL #5   Title reduction of pain by end of day >/= 50% due to increase tissue mobility   Time 8   Period Weeks   Status On-going           Long Term Clinic Goals - 07/11/16 1128      CC Long Term Goal  #2   Title Pt. will report swelling is adequately managed to enable ADL function at a consistent level.   Status On-going     CC Long Term Goal  #4   Title Pain/discomfort at right axilla area will be controlled at 6/10 or less.   Status On-going     CC Long Term Goal  #5   Title Patient will avoid infection by ongoing management of her lymphedema at right breast/axilla/upper arm areas.   Status On-going     CC Long Term Goal  #6   Title Pain in right groin/ischial tuberosity area will be controlled at 3/10 level or less to enable walking with less pain.   Status On-going            Plan - 07/14/16 1635    Clinical Impression Statement Increased discomfort at right thigh today after more weekend activity with family visiting.  Continues to get relief with therapy.   Rehab Potential Good   Clinical Impairments Affecting Rehab Potential active cancer   PT Frequency 2x / week   PT Duration 12 weeks   PT Treatment/Interventions ADLs/Self Care Home Management;Therapeutic activities;Therapeutic exercise;Patient/family education;Manual techniques;Manual lymph drainage;Scar mobilization   PT Next Visit Plan manual lymph drainage, myofascial release, and soft tissue work.   Consulted and Agree with Plan of Care Patient      Patient will benefit from skilled therapeutic intervention in order to improve the following deficits and impairments:  Increased edema, Increased fascial  restricitons, Pain  Visit Diagnosis: Lymphedema, not elsewhere classified  Pain in right thigh     Problem List Patient Active Problem List   Diagnosis Date Noted  . Liver metastases ([place]) 02/23/2016  . 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 Zoster 04/04/2015  . Nausea with vomiting 11/18/2014  . Constipation 11/18/2014  . Left-sided thoracic back pain   . Bone metastases ([place]) 11/16/2014  .  Back pain 11/15/2014  . Uncontrolled pain 11/14/2014  . Post-lymphadenectomy lymphedema of arm 05/31/2014  . Chest wall pain 03/21/2014  . Abnormal LFTs (liver function tests) 09/12/2013  . Malignant neoplasm of upper-inner quadrant of right breast in female, estrogen receptor positive ([place]) 08/18/2013  . 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

## 2016-07-15 ENCOUNTER — Ambulatory Visit: Payer: 59 | Admitting: Physical Therapy

## 2016-07-15 ENCOUNTER — Encounter: Payer: 59 | Admitting: Physical Therapy

## 2016-07-15 ENCOUNTER — Other Ambulatory Visit (HOSPITAL_BASED_OUTPATIENT_CLINIC_OR_DEPARTMENT_OTHER): Payer: 59

## 2016-07-15 DIAGNOSIS — C7951 Secondary malignant neoplasm of bone: Secondary | ICD-10-CM

## 2016-07-15 DIAGNOSIS — Z170 Estrogen receptor positive status [ER+]: Secondary | ICD-10-CM | POA: Diagnosis not present

## 2016-07-15 DIAGNOSIS — C50211 Malignant neoplasm of upper-inner quadrant of right female breast: Secondary | ICD-10-CM | POA: Diagnosis not present

## 2016-07-15 DIAGNOSIS — C771 Secondary and unspecified malignant neoplasm of intrathoracic lymph nodes: Secondary | ICD-10-CM | POA: Diagnosis not present

## 2016-07-15 DIAGNOSIS — C787 Secondary malignant neoplasm of liver and intrahepatic bile duct: Secondary | ICD-10-CM | POA: Diagnosis not present

## 2016-07-15 LAB — CBC WITH DIFFERENTIAL/PLATELET
BASO%: 2.4 % — AB (ref 0.0–2.0)
Basophils Absolute: 0.1 10*3/uL (ref 0.0–0.1)
EOS%: 2 % (ref 0.0–7.0)
Eosinophils Absolute: 0.1 10*3/uL (ref 0.0–0.5)
HCT: 32.4 % — ABNORMAL LOW (ref 34.8–46.6)
HEMOGLOBIN: 11.6 g/dL (ref 11.6–15.9)
LYMPH#: 0.5 10*3/uL — AB (ref 0.9–3.3)
LYMPH%: 18.9 % (ref 14.0–49.7)
MCH: 37.7 pg — ABNORMAL HIGH (ref 25.1–34.0)
MCHC: 35.8 g/dL (ref 31.5–36.0)
MCV: 105.2 fL — ABNORMAL HIGH (ref 79.5–101.0)
MONO#: 0.2 10*3/uL (ref 0.1–0.9)
MONO%: 9.4 % (ref 0.0–14.0)
NEUT#: 1.7 10*3/uL (ref 1.5–6.5)
NEUT%: 67.3 % (ref 38.4–76.8)
NRBC: 0 % (ref 0–0)
Platelets: 203 10*3/uL (ref 145–400)
RBC: 3.08 10*6/uL — AB (ref 3.70–5.45)
RDW: 13.2 % (ref 11.2–14.5)
WBC: 2.5 10*3/uL — AB (ref 3.9–10.3)

## 2016-07-16 ENCOUNTER — Telehealth: Payer: Self-pay

## 2016-07-16 NOTE — Telephone Encounter (Signed)
Pt called to give update on new areas of pain concern.  Pt reports pain after bowel movement in left abdomen between naval and hip bone.  Pt wants Dr Jana Hakim and Dr Earma Reading to be aware of this prior to her next MRI

## 2016-07-18 ENCOUNTER — Ambulatory Visit: Payer: 59 | Admitting: Physical Therapy

## 2016-07-18 ENCOUNTER — Ambulatory Visit: Payer: 59 | Admitting: Oncology

## 2016-07-18 DIAGNOSIS — M79651 Pain in right thigh: Secondary | ICD-10-CM | POA: Diagnosis not present

## 2016-07-18 DIAGNOSIS — I890 Lymphedema, not elsewhere classified: Secondary | ICD-10-CM

## 2016-07-18 NOTE — Therapy (Signed)
[place], [place], [address] Phone: [phone]   Fax:  [phone]  Physical Therapy Treatment  Patient Details  Name: [name] [name] MRN: [id number] Date of Birth: 1960-06-06 Referring Provider: Dr. Kyung Rudd  Encounter Date: 07/18/2016      PT End of Session - 07/18/16 1158    Visit Number 26  32 for lymph   Number of Visits 63  for lymph   Date for PT Re-Evaluation 09/15/16   Authorization - Visit Number 33   Authorization - Number of Visits 140  for 2018   PT Start Time 1009   PT Stop Time 1055   PT Time Calculation (min) 46 min   Activity Tolerance Patient tolerated treatment well   Behavior During Therapy [facility] for tasks assessed/performed      Past Medical History:  Diagnosis Date  . Bone metastases ([place]) dx'd 05/2014  . Breast cancer ([place]) dx'd 2005/2011  . Peripheral vascular disease ([place]) 02/2010   blood clot related to porta cath  . PONV (postoperative nausea and vomiting)   . S/P radiation therapy 07/17/2014 through 08/02/2014    Left mediastinum, left seventh rib 3250 cGy in 13 sessions   . S/P radiation therapy 12/11/2014 through 12/22/2014    Left parietal calvarium 2400 cGy in 8 sessions   . Seizures ([place]) 2010   Isolated incident.    Past Surgical History:  Procedure Laterality Date  . AXILLARY LYMPH NODE DISSECTION  Dec. 2011  . BREAST LUMPECTOMY  2005  . IR GENERIC HISTORICAL  05/21/2016   IR RADIOLOGIST EVAL & MGMT 05/21/2016 Sandi Mariscal, MD [facility] INTERV RAD  . MEDIASTINOTOMY CHAMBERLAIN MCNEIL Left 06/02/2013   Procedure: MEDIASTINOTOMY CHAMBERLAIN MCNEIL;  Surgeon: [name], MD;  Location: [place];  Service: Thoracic;  Laterality: Left;  LEFT ANTERIOR MEDIASTINOTOMY   . PORTACATH PLACEMENT  12/11  .  removal portacath      There were no vitals filed for this visit.      Subjective Assessment - 07/18/16 1010    Subjective Not feeling good today.  I think my counts are really low.   Currently in Pain? Yes   Pain Score 3    Pain Location Groin   Pain Orientation Right   Pain Descriptors / Indicators Tightness   Pain Score 6  5-6   Pain Location Axilla   Pain Orientation Right   Pain Descriptors / Indicators Other (Comment)  full                         [facility] Adult PT Treatment/Exercise - 07/18/16 0001      Manual Therapy   Myofascial Release right axilla with focus on scar tightness   Manual Lymphatic Drainage (MLD) In left sidelying, posterior interaxillary anastomosis and right axillo-inguinal anastomosis; in supine, short neck, left axilla and anterior interaxillary anastomosis, right groin and axillo-inguinal anastomosis, and right upper extremity from fingers to shoulder.  In right sidelying, left periscapular area toward left groin   Other Manual Therapy soft tissue work at superior aspect of right medial and upper thigh in prone position for pain relief and muscle relaxation, gentle myofascial release at upper thigh, with releases today from far medial to lateral aspects of posterior thigh                  PT Short Term Goals - 02/12/16 1226      PT SHORT TERM GOAL #  1   Title pain with walking decreased >/= 25%   Time 4   Period Weeks   Status Achieved           PT Long Term Goals - 07/08/16 1324      PT LONG TERM GOAL #1   Title indpendent with HEP   Time 8   Period Weeks   Status On-going     PT LONG TERM GOAL #2   Title pain with walking decreased >/= 75%   Time 8   Period Weeks   Status On-going     PT LONG TERM GOAL #3   Title ability to flex her right hip with right groin pain decreased >/= 50% due to improved tissue mobility   Time 8   Period Weeks   Status Achieved     PT LONG TERM GOAL #4   Title waking up in  middle of night with pain decreased >/=50%   Time 8   Period Weeks   Status Achieved     PT LONG TERM GOAL #5   Title reduction of pain by end of day >/= 50% due to increase tissue mobility   Time 8   Period Weeks   Status On-going           Long Term Clinic Goals - 07/11/16 1128      CC Long Term Goal  #2   Title Pt. will report swelling is adequately managed to enable ADL function at a consistent level.   Status On-going     CC Long Term Goal  #4   Title Pain/discomfort at right axilla area will be controlled at 6/10 or less.   Status On-going     CC Long Term Goal  #5   Title Patient will avoid infection by ongoing management of her lymphedema at right breast/axilla/upper arm areas.   Status On-going     CC Long Term Goal  #6   Title Pain in right groin/ischial tuberosity area will be controlled at 3/10 level or less to enable walking with less pain.   Status On-going            Plan - 07/18/16 1159    Clinical Impression Statement Patient not feeling well today and doesn't look well-looks tired.  She had increased tightness at the upper outer posterior thigh today, which was difficult to try to release, but patient reported feeling much better at end of session.   Rehab Potential Good   Clinical Impairments Affecting Rehab Potential active cancer   PT Frequency 2x / week   PT Duration 12 weeks   PT Treatment/Interventions ADLs/Self Care Home Management;Therapeutic activities;Therapeutic exercise;Patient/family education;Manual techniques;Manual lymph drainage;Scar mobilization   PT Next Visit Plan manual lymph drainage, myofascial release, and soft tissue work.   Consulted and Agree with Plan of Care Patient      Patient will benefit from skilled therapeutic intervention in order to improve the following deficits and impairments:  Increased edema, Increased fascial restricitons, Pain  Visit Diagnosis: Lymphedema, not elsewhere classified  Pain in right  thigh     Problem List Patient Active Problem List   Diagnosis Date Noted  . Liver metastases ([place]) 02/23/2016  . 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 Zoster 04/04/2015  . Nausea with vomiting 11/18/2014  . Constipation 11/18/2014  . Left-sided thoracic back pain   . Bone metastases ([place]) 11/16/2014  . Back pain 11/15/2014  . Uncontrolled pain 11/14/2014  . Post-lymphadenectomy lymphedema of arm 05/31/2014  .  Chest wall pain 03/21/2014  . Abnormal LFTs (liver function tests) 09/12/2013  . Malignant neoplasm of upper-inner quadrant of right breast in female, estrogen receptor positive ([place]) 08/18/2013  . 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

## 2016-07-21 ENCOUNTER — Ambulatory Visit: Payer: 59 | Admitting: Physical Therapy

## 2016-07-21 ENCOUNTER — Other Ambulatory Visit (HOSPITAL_BASED_OUTPATIENT_CLINIC_OR_DEPARTMENT_OTHER): Payer: 59

## 2016-07-21 DIAGNOSIS — C7951 Secondary malignant neoplasm of bone: Secondary | ICD-10-CM

## 2016-07-21 DIAGNOSIS — M79651 Pain in right thigh: Secondary | ICD-10-CM | POA: Diagnosis not present

## 2016-07-21 DIAGNOSIS — C50211 Malignant neoplasm of upper-inner quadrant of right female breast: Secondary | ICD-10-CM | POA: Diagnosis not present

## 2016-07-21 DIAGNOSIS — C771 Secondary and unspecified malignant neoplasm of intrathoracic lymph nodes: Secondary | ICD-10-CM

## 2016-07-21 DIAGNOSIS — Z170 Estrogen receptor positive status [ER+]: Principal | ICD-10-CM

## 2016-07-21 DIAGNOSIS — I890 Lymphedema, not elsewhere classified: Secondary | ICD-10-CM

## 2016-07-21 DIAGNOSIS — C787 Secondary malignant neoplasm of liver and intrahepatic bile duct: Secondary | ICD-10-CM | POA: Diagnosis not present

## 2016-07-21 LAB — CBC WITH DIFFERENTIAL/PLATELET
BASO%: 1.9 % (ref 0.0–2.0)
Basophils Absolute: 0.1 10*3/uL (ref 0.0–0.1)
EOS%: 2.3 % (ref 0.0–7.0)
Eosinophils Absolute: 0.1 10*3/uL (ref 0.0–0.5)
HCT: 32 % — ABNORMAL LOW (ref 34.8–46.6)
HEMOGLOBIN: 11.2 g/dL — AB (ref 11.6–15.9)
LYMPH%: 15.7 % (ref 14.0–49.7)
MCH: 37.2 pg — AB (ref 25.1–34.0)
MCHC: 35 g/dL (ref 31.5–36.0)
MCV: 106.3 fL — ABNORMAL HIGH (ref 79.5–101.0)
MONO#: 0.4 10*3/uL (ref 0.1–0.9)
MONO%: 15.3 % — AB (ref 0.0–14.0)
NEUT#: 1.7 10*3/uL (ref 1.5–6.5)
NEUT%: 64.8 % (ref 38.4–76.8)
Platelets: 193 10*3/uL (ref 145–400)
RBC: 3.01 10*6/uL — ABNORMAL LOW (ref 3.70–5.45)
RDW: 13.4 % (ref 11.2–14.5)
WBC: 2.6 10*3/uL — AB (ref 3.9–10.3)
lymph#: 0.4 10*3/uL — ABNORMAL LOW (ref 0.9–3.3)

## 2016-07-21 LAB — COMPREHENSIVE METABOLIC PANEL
ALT: 67 U/L — AB (ref 0–55)
AST: 52 U/L — AB (ref 5–34)
Albumin: 3.8 g/dL (ref 3.5–5.0)
Alkaline Phosphatase: 100 U/L (ref 40–150)
Anion Gap: 8 mEq/L (ref 3–11)
BUN: 10.2 mg/dL (ref 7.0–26.0)
CHLORIDE: 103 meq/L (ref 98–109)
CO2: 27 mEq/L (ref 22–29)
CREATININE: 0.8 mg/dL (ref 0.6–1.1)
Calcium: 9 mg/dL (ref 8.4–10.4)
EGFR: 78 mL/min/{1.73_m2} — ABNORMAL LOW (ref >90)
GLUCOSE: 75 mg/dL (ref 70–140)
POTASSIUM: 3.9 meq/L (ref 3.5–5.1)
SODIUM: 138 meq/L (ref 136–145)
Total Bilirubin: 0.23 mg/dL (ref 0.20–1.20)
Total Protein: 6.8 g/dL (ref 6.4–8.3)

## 2016-07-21 NOTE — Therapy (Signed)
[place], [place], [address] Phone: [phone]   Fax:  [phone]  Physical Therapy Treatment  Patient Details  Name: [name] [name] MRN: [id number] Date of Birth: January 12, 1961 Referring Provider: Dr. Kyung Rudd  Encounter Date: 07/21/2016      PT End of Session - 07/21/16 1219    Visit Number 56  33 for lymph   Number of Visits 78  for lymph   Date for PT Re-Evaluation 09/15/16   Authorization - Visit Number 59   Authorization - Number of Visits 140  for 2018   PT Start Time 1018   PT Stop Time 1100   PT Time Calculation (min) 42 min   Activity Tolerance Patient tolerated treatment well   Behavior During Therapy [facility] for tasks assessed/performed      Past Medical History:  Diagnosis Date  . Bone metastases ([place]) dx'd 05/2014  . Breast cancer ([place]) dx'd 2005/2011  . Peripheral vascular disease ([place]) 02/2010   blood clot related to porta cath  . PONV (postoperative nausea and vomiting)   . S/P radiation therapy 07/17/2014 through 08/02/2014    Left mediastinum, left seventh rib 3250 cGy in 13 sessions   . S/P radiation therapy 12/11/2014 through 12/22/2014    Left parietal calvarium 2400 cGy in 8 sessions   . Seizures ([place]) 2010   Isolated incident.    Past Surgical History:  Procedure Laterality Date  . AXILLARY LYMPH NODE DISSECTION  Dec. 2011  . BREAST LUMPECTOMY  2005  . IR GENERIC HISTORICAL  05/21/2016   IR RADIOLOGIST EVAL & MGMT 05/21/2016 Sandi Mariscal, MD [facility] INTERV RAD  . MEDIASTINOTOMY CHAMBERLAIN MCNEIL Left 06/02/2013   Procedure: MEDIASTINOTOMY CHAMBERLAIN MCNEIL;  Surgeon: [name], MD;  Location: [place];  Service: Thoracic;  Laterality: Left;  LEFT ANTERIOR MEDIASTINOTOMY   . PORTACATH PLACEMENT  12/11  .  removal portacath      There were no vitals filed for this visit.      Subjective Assessment - 07/21/16 1020    Subjective I didn't walk around all that much this weekend; not enough activity to increase thigh pain. Had nausea and dizzy spells on Friday.   Currently in Pain? Yes   Pain Score 3    Pain Location Groin   Pain Orientation Right   Pain Descriptors / Indicators Tightness   Aggravating Factors  walking more   Pain Relieving Factors myofascial release   Pain Score 6  5-6   Pain Location Axilla   Pain Orientation Right   Pain Descriptors / Indicators Other (Comment)  full   Aggravating Factors  more swelling   Pain Relieving Factors therapy                         [facility] Adult PT Treatment/Exercise - 07/21/16 0001      Manual Therapy   Myofascial Release right axilla with focus on scar tightness   Manual Lymphatic Drainage (MLD) In left sidelying, posterior interaxillary anastomosis and right axillo-inguinal anastomosis; in supine, short neck, left axilla and anterior interaxillary anastomosis, right groin and axillo-inguinal anastomosis, and right upper extremity from fingers to shoulder.  In right sidelying, left periscapular area toward left groin   Other Manual Therapy soft tissue work at superior aspect of right medial and upper thigh in prone position for pain relief and muscle relaxation, gentle myofascial release at upper thigh, with releases today from far medial to  lateral aspects of posterior thigh                  PT Short Term Goals - 02/12/16 1226      PT SHORT TERM GOAL #1   Title pain with walking decreased >/= 25%   Time 4   Period Weeks   Status Achieved           PT Long Term Goals - 07/08/16 1324      PT LONG TERM GOAL #1   Title indpendent with HEP   Time 8   Period Weeks   Status On-going     PT LONG TERM GOAL #2   Title pain with walking decreased >/= 75%   Time 8   Period Weeks   Status On-going      PT LONG TERM GOAL #3   Title ability to flex her right hip with right groin pain decreased >/= 50% due to improved tissue mobility   Time 8   Period Weeks   Status Achieved     PT LONG TERM GOAL #4   Title waking up in middle of night with pain decreased >/=50%   Time 8   Period Weeks   Status Achieved     PT LONG TERM GOAL #5   Title reduction of pain by end of day >/= 50% due to increase tissue mobility   Time 8   Period Weeks   Status On-going           Long Term Clinic Goals - 07/11/16 1128      CC Long Term Goal  #2   Title Pt. will report swelling is adequately managed to enable ADL function at a consistent level.   Status On-going     CC Long Term Goal  #4   Title Pain/discomfort at right axilla area will be controlled at 6/10 or less.   Status On-going     CC Long Term Goal  #5   Title Patient will avoid infection by ongoing management of her lymphedema at right breast/axilla/upper arm areas.   Status On-going     CC Long Term Goal  #6   Title Pain in right groin/ischial tuberosity area will be controlled at 3/10 level or less to enable walking with less pain.   Status On-going            Plan - 07/21/16 1220    Clinical Impression Statement Patient doing a little better, but has had nausea and dizziness since Friday; she feels it is from her blood counts being low from her Ibrance.  Continues to benefit from therapy.   Rehab Potential Good   Clinical Impairments Affecting Rehab Potential active cancer   PT Frequency 2x / week   PT Duration 12 weeks   PT Treatment/Interventions ADLs/Self Care Home Management;Therapeutic activities;Therapeutic exercise;Patient/family education;Manual techniques;Manual lymph drainage;Scar mobilization   PT Next Visit Plan manual lymph drainage, myofascial release, and soft tissue work.   Consulted and Agree with Plan of Care Patient      Patient will benefit from skilled therapeutic intervention in order to improve  the following deficits and impairments:  Increased edema, Increased fascial restricitons, Pain  Visit Diagnosis: Lymphedema, not elsewhere classified  Pain in right thigh     Problem List Patient Active Problem List   Diagnosis Date Noted  . Liver metastases ([place]) 02/23/2016  . 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 Zoster 04/04/2015  . Nausea with vomiting 11/18/2014  . Constipation  11/18/2014  . Left-sided thoracic back pain   . Bone metastases ([place]) 11/16/2014  . Back pain 11/15/2014  . Uncontrolled pain 11/14/2014  . Post-lymphadenectomy lymphedema of arm 05/31/2014  . Chest wall pain 03/21/2014  . Abnormal LFTs (liver function tests) 09/12/2013  . Malignant neoplasm of upper-inner quadrant of right breast in female, estrogen receptor positive ([place]) 08/18/2013  . 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

## 2016-07-22 ENCOUNTER — Encounter: Payer: Self-pay | Admitting: Physical Therapy

## 2016-07-22 ENCOUNTER — Ambulatory Visit: Payer: 59 | Admitting: Physical Therapy

## 2016-07-22 DIAGNOSIS — M25551 Pain in right hip: Secondary | ICD-10-CM

## 2016-07-22 DIAGNOSIS — M62838 Other muscle spasm: Secondary | ICD-10-CM

## 2016-07-22 DIAGNOSIS — M79651 Pain in right thigh: Secondary | ICD-10-CM

## 2016-07-22 LAB — CANCER ANTIGEN 27.29: CA 27.29: 5263.4 U/mL — ABNORMAL HIGH (ref 0.0–38.6)

## 2016-07-22 NOTE — Therapy (Signed)
[facility] [address] [place], [place], [address] Phone: [phone]   Fax:  [phone]  Physical Therapy Treatment  Patient Details  Name: [name] [name] MRN: [id number] Date of Birth: Sep 11, 1960 Referring Provider: Dr. Kyung Rudd  Encounter Date: 07/22/2016      PT End of Session - 07/22/16 1305    Visit Number 4   Date for PT Re-Evaluation 09/15/16   Authorization - Visit Number 31   Authorization - Number of Visits 140   PT Start Time 3646   PT Stop Time 1308   PT Time Calculation (min) 38 min   Activity Tolerance Patient tolerated treatment well   Behavior During Therapy [facility] for tasks assessed/performed      Past Medical History:  Diagnosis Date  . Bone metastases ([place]) dx'd 05/2014  . Breast cancer ([place]) dx'd 2005/2011  . Peripheral vascular disease ([place]) 02/2010   blood clot related to porta cath  . PONV (postoperative nausea and vomiting)   . S/P radiation therapy 07/17/2014 through 08/02/2014    Left mediastinum, left seventh rib 3250 cGy in 13 sessions   . S/P radiation therapy 12/11/2014 through 12/22/2014    Left parietal calvarium 2400 cGy in 8 sessions   . Seizures ([place]) 2010   Isolated incident.    Past Surgical History:  Procedure Laterality Date  . AXILLARY LYMPH NODE DISSECTION  Dec. 2011  . BREAST LUMPECTOMY  2005  . IR GENERIC HISTORICAL  05/21/2016   IR RADIOLOGIST EVAL & MGMT 05/21/2016 Sandi Mariscal, MD [facility] INTERV RAD  . MEDIASTINOTOMY CHAMBERLAIN MCNEIL Left 06/02/2013   Procedure: MEDIASTINOTOMY CHAMBERLAIN MCNEIL;  Surgeon: [name], MD;  Location: [place];  Service: Thoracic;  Laterality: Left;  LEFT ANTERIOR MEDIASTINOTOMY   . PORTACATH PLACEMENT  12/11  . removal portacath      There were no vitals filed  for this visit.      Subjective Assessment - 07/22/16 1238    Subjective I feel the muscles are tight.  I want to see how it feels internally. I have had diarrhea. I do not feel the tightness is linked to the walking.    Limitations Writing   How long can you walk comfortably? more she walks patient will have increased pain that stops her from walking further   Diagnostic tests x rays of back show no fractures;    Patient Stated Goals walk 20 minutes with minimal limp   Currently in Pain? Yes   Pain Score 2    Pain Location Groin   Pain Orientation Right   Pain Type Chronic pain   Pain Radiating Towards right upper thigh   Pain Onset More than a month ago   Pain Frequency Constant   Aggravating Factors  walking   Pain Relieving Factors myofascial release   Effect of Pain on Daily Activities walking   Multiple Pain Sites No                         [facility] Adult PT Treatment/Exercise - 07/22/16 0001      Manual Therapy   Manual Therapy Soft tissue mobilization;Internal Pelvic Floor   Soft tissue mobilization right hamstring, hip adductors, and quads   Internal Pelvic Floor bilateral sides of urethra on urethra sphincter, post. intoitus  with gently pressure releasing restrictions                  PT Short Term Goals -  02/12/16 1226      PT SHORT TERM GOAL #1   Title pain with walking decreased >/= 25%   Time 4   Period Weeks   Status Achieved           PT Long Term Goals - 07/08/16 1324      PT LONG TERM GOAL #1   Title indpendent with HEP   Time 8   Period Weeks   Status On-going     PT LONG TERM GOAL #2   Title pain with walking decreased >/= 75%   Time 8   Period Weeks   Status On-going     PT LONG TERM GOAL #3   Title ability to flex her right hip with right groin pain decreased >/= 50% due to improved tissue mobility   Time 8   Period Weeks   Status Achieved     PT LONG TERM GOAL #4   Title waking up in middle of night  with pain decreased >/=50%   Time 8   Period Weeks   Status Achieved     PT LONG TERM GOAL #5   Title reduction of pain by end of day >/= 50% due to increase tissue mobility   Time 8   Period Weeks   Status On-going           Long Term Clinic Goals - 07/11/16 1128      CC Long Term Goal  #2   Title Pt. will report swelling is adequately managed to enable ADL function at a consistent level.   Status On-going     CC Long Term Goal  #4   Title Pain/discomfort at right axilla area will be controlled at 6/10 or less.   Status On-going     CC Long Term Goal  #5   Title Patient will avoid infection by ongoing management of her lymphedema at right breast/axilla/upper arm areas.   Status On-going     CC Long Term Goal  #6   Title Pain in right groin/ischial tuberosity area will be controlled at 3/10 level or less to enable walking with less pain.   Status On-going            Plan - 07/22/16 1307    Clinical Impression Statement Patient reports her urine stream is not causing splash on her buttocks now.  Patient has tightness located in the vaginal and right groin.  Patient reports the soft tissue work reduces the tightness making it easier to walk. Patient is able to wait for 2 weeks till next session.  After therapy tightness decreased by 50%. Patient will benefit from skilled therapy to reduce tightness and keep patient walking.    Rehab Potential Good   Clinical Impairments Affecting Rehab Potential active cancer   PT Frequency 2x / week   PT Duration 12 weeks   PT Treatment/Interventions ADLs/Self Care Home Management;Therapeutic activities;Therapeutic exercise;Patient/family education;Manual techniques;Manual lymph drainage;Scar mobilization   PT Next Visit Plan manual lymph drainage, myofascial release, and soft tissue work.   PT Home Exercise Plan progress as needed   Consulted and Agree with Plan of Care Patient      Patient will benefit from skilled therapeutic  intervention in order to improve the following deficits and impairments:  Increased edema, Increased fascial restricitons, Pain  Visit Diagnosis: Pain in right thigh  Other muscle spasm  Pain in right hip     Problem List Patient Active Problem List   Diagnosis Date Noted  .  Liver metastases ([place]) 02/23/2016  . 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 Zoster 04/04/2015  . Nausea with vomiting 11/18/2014  . Constipation 11/18/2014  . Left-sided thoracic back pain   . Bone metastases ([place]) 11/16/2014  . Back pain 11/15/2014  . Uncontrolled pain 11/14/2014  . Post-lymphadenectomy lymphedema of arm 05/31/2014  . Chest wall pain 03/21/2014  . Abnormal LFTs (liver function tests) 09/12/2013  . Malignant neoplasm of upper-inner quadrant of right breast in female, estrogen receptor positive ([place]) 08/18/2013  . 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 [place], [place], [address] Phone: [phone]   Fax:  [phone]  Name: [name] [name] MRN: [id number] Date of Birth: 07-May-1960

## 2016-07-24 ENCOUNTER — Ambulatory Visit: Payer: 59

## 2016-07-24 ENCOUNTER — Ambulatory Visit (HOSPITAL_COMMUNITY)
Admission: RE | Admit: 2016-07-24 | Discharge: 2016-07-24 | Disposition: A | Discharge status: Home / Self Care | Payer: 59 | Source: Ambulatory Visit | Attending: Oncology | Admitting: Oncology

## 2016-07-24 DIAGNOSIS — Z170 Estrogen receptor positive status [ER+]: Secondary | ICD-10-CM | POA: Insufficient documentation

## 2016-07-24 DIAGNOSIS — C787 Secondary malignant neoplasm of liver and intrahepatic bile duct: Secondary | ICD-10-CM | POA: Insufficient documentation

## 2016-07-24 DIAGNOSIS — C782 Secondary malignant neoplasm of pleura: Secondary | ICD-10-CM | POA: Diagnosis not present

## 2016-07-24 DIAGNOSIS — K8020 Calculus of gallbladder without cholecystitis without obstruction: Secondary | ICD-10-CM | POA: Diagnosis not present

## 2016-07-24 DIAGNOSIS — R7989 Other specified abnormal findings of blood chemistry: Secondary | ICD-10-CM | POA: Insufficient documentation

## 2016-07-24 DIAGNOSIS — C7951 Secondary malignant neoplasm of bone: Secondary | ICD-10-CM | POA: Insufficient documentation

## 2016-07-24 DIAGNOSIS — C771 Secondary and unspecified malignant neoplasm of intrathoracic lymph nodes: Secondary | ICD-10-CM | POA: Diagnosis not present

## 2016-07-24 DIAGNOSIS — C50211 Malignant neoplasm of upper-inner quadrant of right female breast: Secondary | ICD-10-CM | POA: Diagnosis present

## 2016-07-24 MED ORDER — GADOBENATE DIMEGLUMINE 529 MG/ML IV SOLN
15.0000 mL | Freq: Once | INTRAVENOUS | Status: AC | PRN
  Administered 2016-07-24: 15 mL via INTRAVENOUS

## 2016-07-24 NOTE — Progress Notes (Signed)
Pt refused v/s and assessment, "im only here for IV start". 22G placed in left wrist for scan.

## 2016-07-25 ENCOUNTER — Ambulatory Visit: Payer: 59 | Admitting: Physical Therapy

## 2016-07-25 ENCOUNTER — Ambulatory Visit (HOSPITAL_BASED_OUTPATIENT_CLINIC_OR_DEPARTMENT_OTHER): Payer: 59 | Admitting: Oncology

## 2016-07-25 DIAGNOSIS — M859 Disorder of bone density and structure, unspecified: Secondary | ICD-10-CM | POA: Diagnosis not present

## 2016-07-25 DIAGNOSIS — M79651 Pain in right thigh: Secondary | ICD-10-CM | POA: Diagnosis not present

## 2016-07-25 DIAGNOSIS — I890 Lymphedema, not elsewhere classified: Secondary | ICD-10-CM

## 2016-07-25 DIAGNOSIS — C7951 Secondary malignant neoplasm of bone: Secondary | ICD-10-CM

## 2016-07-25 DIAGNOSIS — Z170 Estrogen receptor positive status [ER+]: Secondary | ICD-10-CM

## 2016-07-25 DIAGNOSIS — C787 Secondary malignant neoplasm of liver and intrahepatic bile duct: Secondary | ICD-10-CM

## 2016-07-25 DIAGNOSIS — C50211 Malignant neoplasm of upper-inner quadrant of right female breast: Secondary | ICD-10-CM

## 2016-07-25 DIAGNOSIS — Z86718 Personal history of other venous thrombosis and embolism: Secondary | ICD-10-CM | POA: Diagnosis not present

## 2016-07-25 DIAGNOSIS — C771 Secondary and unspecified malignant neoplasm of intrathoracic lymph nodes: Secondary | ICD-10-CM

## 2016-07-25 MED ORDER — PALBOCICLIB 75 MG PO CAPS: 75.0000 mg | ORAL_CAPSULE | ORAL | 6 refills | Status: DC

## 2016-07-25 NOTE — Progress Notes (Signed)
[place]  [phone] [phone]     ID: [name] OB: [birth year]  MR#: [id number]  JYN#:[id number]  PCP: Pcp Not In System GYN:  [name] SU:  OTHER MD: [name], [name], [name], [name], [name], Kyung Rudd   CHIEF COMPLAINT: Stage IV breast cancer  CURRENT TREATMENT: Fulvestrant, denosumab, palbociclib, [letrozole], pembrolizumab  INTERVAL HISTORY:   [name] [name] returns today for follow-up of her metastatic breast cancer accompanied by her husband [name]. She is here to review results of her restaging studies which do show evidence of progression. The MRI of the liver while showing a couple of lesions to be stable and 1 slightly smaller, in general shows growth in the index lesions. This is not fulminant but clearly measurable. This is also apparent in her tumor marker as well, which has steadily risen..  She does not feel that she is doing well on her current medications. Particularly on the third week of palbociclib she has dizzy spells. She thinks this is worse since the lasers all was started. She also notices that her liver function tests have risen some since the start of letrozole. She tolerates the denial some up/Xgeva and fulvestrant without overt side effects.  Her current dose of palbociclib is still 75 mg daily.  REVIEW OF SYiSTEMS: Raychelle is appropriately discouraged because "nothing is working". She tries one thing after another and we have no evidence of anything making any difference, she says. She still has nausea, although it vomiting is rare. She has pain in her left lower quadrant. She has had some diarrhea, but no fever. Bowel movements tend to be yellow and loose. She stopped taking magnesium as a result. That made things a little bit better. The pain in the right groin area persistent makes it difficult for her to walk and exercise. She keeps a bad taste in her mouth. She  does have very best every day in terms of being as active as she can. A detailed review of systems was otherwise stable  BREAST CANCER HISTORY: From doctor [name]'s intake note 03/20/2004:  "The patient is a very pleasant 56 year old female, without significant past medical history.  Her family history is significant for a sister who at age 31 was diagnosed with invasive ductal carcinoma.  She is a breast cancer survivor at age 56 now.  The patient states that she has never really had a screening mammogram until October 2005, when she felt that it was time for her to start having mammograms done on a yearly basis.  Therefore, on 01/26/04, she underwent a screening mammogram and an abnormality was detected in the upper outer right breast.  She, therefore, underwent spot compression views of both the right and the left breast.  The left breast revealed a well-defined mass in the upper outer left quadrant, present at the 2 o'clock position, measuring 1.8 cm, 6 cm from the nipple.  This, by ultrasound, was felt to be a simple cyst measuring 1.8 cm.  On the right breast, a spiculated mass was noted in the upper outer right quadrant.  The ultrasound revealed a shadowing irregular solid mass at the 10:30 position, 9 cm from the nipple, measuring 1.2 cm in greatest dimension, correlating with the spiculated mass seen on the mammogram.  The right axilla was negative ultrasonically.  Because of this, the patient underwent a needle biopsy of the right breast and the biopsy was positive invasive mammary carcinoma that showed  features consistent with a high-grade invasive ductal carcinoma associated with desmoplastic stroma.  No in situ component was seen and no definite lymphovascular invasion was identified.  On the core biopsy, the tumor measured about 0.8 cm.  Because of this, she was seen by Dr. [name] and the patient was taken to the [facility] on March 15, 2004.  She underwent a right breast lumpectomy  with sentinel node biopsy.  The final pathology revealed an invasive ductal carcinoma, measuring 1.7 cm, grade 2 of 3.  Margins were free of tumor.  Atypical lobular hyperplasia was noted.  One sentinel node was removed which was negative for metastatic disease.  The tumor was staged at T1c, N0 MX.  It was estrogen receptor positive, progesterone receptor positive.  HER-2/neu was 2+.  FISH was negative.  All margins were free of tumor.  She is now seen in Medical Oncology for further evaluation and management of this newly diagnosed T1c, node negative, stage I, invasive ductal carcinoma of the right breast."  Her subsequent history is as detailed below   PAST MEDICAL HISTORY: Past Medical History:  Diagnosis Date  . Bone metastases ([place]) dx'd 05/2014  . Breast cancer ([place]) dx'd 2005/2011  . Peripheral vascular disease ([place]) 02/2010   blood clot related to porta cath  . PONV (postoperative nausea and vomiting)   . S/P radiation therapy 07/17/2014 through 08/02/2014    Left mediastinum, left seventh rib 3250 cGy in 13 sessions   . S/P radiation therapy 12/11/2014 through 12/22/2014    Left parietal calvarium 2400 cGy in 8 sessions   . Seizures ([place]) 2010   Isolated incident.    PAST SURGICAL HISTORY: Past Surgical History:  Procedure Laterality Date  . AXILLARY LYMPH NODE DISSECTION  Dec. 2011  . BREAST LUMPECTOMY  2005  . IR GENERIC HISTORICAL  05/21/2016   IR RADIOLOGIST EVAL & MGMT 05/21/2016 Sandi Mariscal, MD [facility] INTERV RAD  . MEDIASTINOTOMY CHAMBERLAIN MCNEIL Left 06/02/2013   Procedure: MEDIASTINOTOMY CHAMBERLAIN MCNEIL;  Surgeon: [name], MD;  Location: [facility];  Service: Thoracic;  Laterality: Left;  LEFT ANTERIOR MEDIASTINOTOMY   . PORTACATH PLACEMENT  12/11  . removal portacath      FAMILY HISTORY Family History   Problem Relation Age of Onset  . COPD Mother   . Breast cancer Sister 97   The patient's father is living, 67 years old as of may 2015. He lives in [place]. The patient's mother died from complications of COPD at the age of 67. These has 2 brothers, one sister. Her sister developed breast cancer at the age of 57. She is doing well. The patient herself underwent genetic testing at [facility] in 2011 and was found to be BRCA negative  GYNECOLOGIC HISTORY:  Menarche age 12, she is GX P0. She stopped having periods with her initial chemotherapy in 2006.  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 Her husband [name] is a Medical illustrator in [place]. He has a child from a prior marriage. At home they have 2 rescue dogs, Hobo and [place]. The patient is religious but not a church attender    ADVANCED DIRECTIVES: In place; at the 08/04/2014 visit in particular the patient was very clear, with her husband present, that she would not want any kind of feeding tubes or "other tubes" if her condition deteriorated.   HEALTH MAINTENANCE: Social History  Substance Use Topics  .  Smoking status: Never Smoker  . Smokeless tobacco: Never Used  . Alcohol use No     Colonoscopy:  PAP:  Bone density: March 2015; mild osteopenia  Lipid panel:  Allergies  Allergen Reactions  . 2nd Skin Quick Heal Other (See Comments)    Other Reaction: Skin peels  . Decadron [Dexamethasone] Other (See Comments)    Patient does not tolerate steroids.   . Dilaudid [Hydromorphone] Nausea And Vomiting  . Enoxaparin Other (See Comments)    unknown  . Fluconazole Swelling    Liver toxicity  . Hydromorphone Hcl Nausea And Vomiting  . Morphine And Related Nausea And Vomiting  . Protonix [Pantoprazole Sodium] Other (See Comments)    Patient reports it caused thrush.  . Tegaderm Ag Mesh [Silver]     Current Outpatient Prescriptions  Medication Sig  Dispense Refill  . ALPRAZolam (XANAX) 0.5 MG tablet Take 1 tablet (0.5 mg total) by mouth 2 (two) times daily as needed for anxiety. 30 tablet 0  . B Complex-C (B-COMPLEX WITH VITAMIN C) tablet Take 1 tablet by mouth daily. Reported on 03/27/2015    . calcium carbonate (TUMS - DOSED IN MG ELEMENTAL CALCIUM) 500 MG chewable tablet Chew 1 tablet by mouth as directed.    . cholecalciferol 2000 UNITS tablet Take 1 tablet (2,000 Units total) by mouth daily.    . Diphenhyd-Hydrocort-Nystatin (FIRST-DUKES MOUTHWASH) SUSP 5-10 ml qid SWISH AND SPIT 397 mL 3  . folic acid (FOLVITE) 1 MG tablet Take 1 tablet (1 mg total) by mouth daily.    Marland Kitchen loratadine-pseudoephedrine (CLARITIN-D 24-HOUR) 10-240 MG 24 hr tablet Take 1 tablet by mouth daily.    . Melatonin 3 MG TABS Take 3 mg by mouth at bedtime.    . naproxen sodium (ANAPROX) 220 MG tablet Take 220 mg by mouth 2 (two) times daily with a meal.    . palbociclib (IBRANCE) 75 MG capsule Take 1 capsule (75 mg total) by mouth every other day. Take whole with food. 21 capsule 6  . phenazopyridine (PYRIDIUM) 100 MG tablet Take 1 tablet (100 mg total) by mouth 3 (three) times daily as needed for pain. 30 tablet 0  . RABEprazole Sodium 5 MG CPSP Take 5 mg by mouth daily. 30 capsule 3  . saccharomyces boulardii (FLORASTOR) 250 MG capsule Take 250 mg by mouth daily.     . vitamin E (VITAMIN E) 400 UNIT capsule Take 1 capsule (400 Units total) by mouth 2 (two) times daily. 60 capsule 3   No current facility-administered medications for this visit.     OBJECTIVE: Middle-aged white woman Who appears stated age  There were no vitals filed for this visit.   There is no height or weight on file to calculate BMI.   Patient refused vitals 07/26/16    ECOG FS: 1  Sclerae unicteric, pupils round and equal Oropharynx clear and moist No cervical or supraclavicular adenopathy Lungs no rales or rhonchi Heart regular rate and rhythm Abd soft, nontender to deep palpation  including the left lower quadrant, positive bowel sounds MSK no focal spinal tenderness, no upper extremity lymphedema Neuro: nonfocal, well oriented, appropriate affect Breasts: Deferred  LAB RESULTS:   CMP     Component Value Date/Time   NA 138 07/21/2016 1149   K 3.9 07/21/2016 1149   CL 103 12/14/2014 0800   CL 105 05/06/2012 1333   CO2 27 07/21/2016 1149   GLUCOSE 75 07/21/2016 1149   GLUCOSE 124 (H) 05/06/2012 1333  BUN 10.2 07/21/2016 1149   CREATININE 0.8 07/21/2016 1149   CALCIUM 9.0 07/21/2016 1149   PROT 6.8 07/21/2016 1149   ALBUMIN 3.8 07/21/2016 1149   AST 52 (H) 07/21/2016 1149   ALT 67 (H) 07/21/2016 1149   ALKPHOS 100 07/21/2016 1149   BILITOT 0.23 07/21/2016 1149   GFRNONAA >60 12/14/2014 0800   GFRAA >60 12/14/2014 0800    No results found for: SPEP  Lab Results  Component Value Date   WBC 2.6 (L) 07/21/2016   NEUTROABS 1.7 07/21/2016   HGB 11.2 (L) 07/21/2016   HCT 32.0 (L) 07/21/2016   MCV 106.3 (H) 07/21/2016   PLT 193 07/21/2016      Chemistry      Component Value Date/Time   NA 138 07/21/2016 1149   K 3.9 07/21/2016 1149   CL 103 12/14/2014 0800   CL 105 05/06/2012 1333   CO2 27 07/21/2016 1149   BUN 10.2 07/21/2016 1149   CREATININE 0.8 07/21/2016 1149      Component Value Date/Time   CALCIUM 9.0 07/21/2016 1149   ALKPHOS 100 07/21/2016 1149   AST 52 (H) 07/21/2016 1149   ALT 67 (H) 07/21/2016 1149   BILITOT 0.23 07/21/2016 1149     No results for input(s): INR in the last 168 hours.  Urinalysis    Component Value Date/Time   COLORURINE YELLOW 11/17/2014 0143   APPEARANCEUR CLOUDY (A) 11/17/2014 0143   LABSPEC 1.005 05/16/2016 1201   PHURINE 6.5 05/16/2016 1201   PHURINE 6.5 11/17/2014 0143   GLUCOSEU Negative 05/16/2016 1201   HGBUR Negative 05/16/2016 1201   HGBUR NEGATIVE 11/17/2014 0143   BILIRUBINUR Negative 05/16/2016 1201   KETONESUR Negative 05/16/2016 1201   KETONESUR NEGATIVE 11/17/2014 0143   PROTEINUR  Negative 05/16/2016 1201   PROTEINUR NEGATIVE 11/17/2014 0143   UROBILINOGEN 0.2 05/16/2016 1201   NITRITE Negative 05/16/2016 1201   NITRITE NEGATIVE 11/17/2014 0143   LEUKOCYTESUR Small 05/16/2016 1201     STUDIES: Mr Liver W Wo Contrast  Result Date: 07/24/2016 CLINICAL DATA:  Metastatic breast cancer with known left pleural, hepatic, and abdominal nodal metastasis. EXAM: MRI ABDOMEN WITHOUT AND WITH CONTRAST TECHNIQUE: Multiplanar multisequence MR imaging of the abdomen was performed both before and after the administration of intravenous contrast. CONTRAST:  26m MULTIHANCE GADOBENATE DIMEGLUMINE 529 MG/ML IV SOLN COMPARISON:  PET of 05/14/2016.  Abdominal MRI of 05/08/2016. FINDINGS: Portions of exam are minimally motion degraded. Lower chest: Normal heart size. Left-sided pleural thickening and nodularity are again identified. An index medial left pleural enhancing nodule measures 1.5 cm on image 17/ series 901 and is similar. A more anterior inferior pleural nodule measures 1.4 cm on image 19/ series 901 and is not significantly changed. Similar left base atelectasis. Hepatobiliary: Moderate hepatic steatosis. Hepatic metastasis are again identified bilaterally. These are most directly compared on the early post-contrast enhancement series 901 today versus image [id number] the prior. Central right hepatic lobe lesion measures 3.5 x 2.8 cm on image 43 today versus 2.7 x 2.6 cm on the prior. Subcapsular more inferior right hepatic lobe lesion measures 5.2 x 4.1 cm on image 51 today versus 4.9 x 3.9 cm on the prior. Pericholecystic right liver lobe lesion measures 3.8 x 2.9 cm on image 60 today versus 2.9 x 2.1 cm on the prior. 10 mm gallstone without acute cholecystitis or biliary duct dilatation. Pancreas:  Normal, without mass or ductal dilatation. Spleen:  Normal in size, without focal abnormality. Adrenals/Urinary Tract: Normal  adrenal glands. Normal kidneys, without hydronephrosis.  Stomach/Bowel: Normal stomach and abdominal bowel loops. Vascular/Lymphatic: Normal caliber of the aorta and branch vessels. Circumaortic left renal vein. Index left periaortic node is similar to decreased in size at 8 x 8 mm on image 51/ series 901. Compare 9 x 11 mm on the prior. However, there are enlarged hyperenhancing nodes in the porta hepatis. Example at 1.4 cm anteriorly on image 25/ series 3 (1.1 cm on the prior.) 1.4 cm on image 26/series 3 in the portal caval space. Compare 0.8 cm on the prior. Other:  No ascites.  No evidence of omental or peritoneal disease. Musculoskeletal: Osseous metastasis are again identified. A T2 hyperintense T11 lesion is unchanged at 1.5 cm on image 21/series 3. A right-sided lamina lesion at L1 measures 10 mm today versus 11 mm on the prior. Inferior L3 lesion measures 2.9 x 1.8 cm on image 25/series 11 today. Compare 3.1 x 1.9 cm at the same level on the prior. IMPRESSION: 1. Since the prior MRI of 05/08/2016, mild progression of hepatic metastasis. 2. Enlargement of porta hepatis nodes, consistent with progressive nodal metastasis. 3. Similar to slight improvement in osseous metastasis. 4. Similar left pleural metastasis. 5. Cholelithiasis. Electronically Signed   By: Abigail Miyamoto M.D.   On: 07/24/2016 16:21    ASSESSMENT: 56 y.o. BRCA negative [place] woman with stage IV breast cancer, history as follows  (1)  S/p Right upper inner quadrant lumpectomy and sentinel lymph node sampling 03/15/2004 for a pT1c pN0. Stage IA invasive ductal carcinoma, grade 2, estrogen receptor 95% positive, progesterone receptor 65% positive, HER-2 not amplified; additional surgery 04/25/2004 for seroma or clearance showed no residual tumor  (2) adjuvant chemotherapy with cyclophosphamide and doxorubicin every 21 days x4 completed 07/19/2004  (3) adjuvant radiation given under Dr. Donella Stade in [place] completed July 2006  (4) the patient opted against adjuvant antiestrogen  therapy  (5) genetics testing showed no BRCA mutations  (6) biopsy of a palpable right axillary mass 10/24/2009 showed invasive ductal carcinoma, grade 3, estrogen receptor 100% positive, progesterone receptor 2% positive (alert score 5) HER-2 negative; no evidence of systemic disease on PET scanning  (7) completed 3 of 4 planned cycles of docetaxel and cyclophosphamide September 2011, fourth cycle omitted because of marked elevations in liver function tests  (8) an right axillary lymph node dissection 03/06/2010 showed 3/8 lymph nodes removed to be involved by tumor, with extracapsular extension.  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, TTS-1 negative), estrogen receptor 80% positive, progesterone receptor 2% positive, HER-2 not amplified  (12) letrozole started March 2015-- tolerated with significant side effects, discontinued at the end of May 2015  (13) PET scan 08/16/2013 shows extensive left pleural metastatic disease and a large left pleural effusion that shifts cardiac and mediastinal structures to the right; adenopathy (celiac trunk, periadrenal, periaortic); and a left medial clavicular lesion; Status post left thoracentesis 08/16/2013 positive for adenocarcinoma, estrogen receptor positive, progesterone receptor negative.  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, mutations in [facility] T7001V, a complex rearrangement in PIK3R2, and amplification ofPIK3C2B]],  amplification of MCL1 and MDM4, anda MAP2K4 R287H mutation; everolimus was suggested as an available targeted agent  (18) exemestane started 03/31/2014, discontinued 10/31/2014 with evidence of progression  (a) everolimus added 04/03/2014 but not tolerated (cytopenias, elevated LFTs) even at minimal doses; stopped 04/17/2014  (19) fulvestrant started 12/20/2014  (a) palbociclib added at very low dose 04/03/2015 (starting dose 75 mg weekly)  (b) palbociclib dose gradually increased to 75 mg daily, 21/7, as of May 2017  (c) palbociclib dose increased to 100 mg daily, 21/ 7, beginning November mid- cycle  (d) palbociclib dose decreased to 75 mg daily beginning with cycle starting 05/25/2016  (e) letrozole 2.5 mg started 05/26/2016, held as of 07/25/2016 with poor tolerance  (f) palbociclib dose decreased to 75 mg every other day beginning 07/25/2016  (20) liver biopsy 03/20/2015 confirms metastatic carcinoma, still estrogen receptor positive at 100%, progesterone receptor negative, HER-2 equivocal with a signals ratio 1.41, number per cell 4.50.   (a) repeat liver biopsy December 2017 might show further changes (HER-2 positivity)?  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 treated with Lovenox for 5-6 months  (c) right upper extremity lymphedema--receiving physical therapy  (d) hepatic steatosis with chronically elevated LFTs as well as unusual hepatic sensitivity to chemotherapy  (e) osteopenia with the lowest T score -1.6 on bone density scan 06/20/2013  (i) on denosumab/ Xgeva Q28d  (f) radiation oncology (Dr Valere Dross) has reviewed prior radiation records in case there is further mediastinal involvement with dysphagia etc in which case palliative XRT could be considered  (a) radiation to left mediastinum/ left 7th rib 3250 cGy in 13 sessions04/18/2016 through 08/02/2014  (b) radiation to T11 area: 22 Gy in 7 sessions, last dose 11/27/2014  (c) radiation left parietal scalp region to be completed 12/22/2014  (d) radiation to sacral area completed 04/09/2015  (e) radiation to right inferior pelvis and left ninth rib (24 gray, 12/24/2015--01/02/2016)  (f) T11 was treated stereotactically with 14 gray in 1 fraction 03/12/2016.  (g) chest wall and perineal pain--improved post radiation treatments  (a) discussed celebrex/ carafate but demurs  (h) zoster diagnosed 04/04/2015-- on valacyclovir--resolved   PLAN: We spent approximately 55 minutes today going over [place] as situation in detail. I think she is correcting her assessments that nothing we have done has clearly interrupted the slow progression of her tumor, aside from the radiation she received to the left chest, and possibly the radiation she received to the pelvic area. In short local treatment seems to work but multiple systemic treatments have not arrested tumor growth.  Certainly one option in cases like this is to stop all therapy and  concentrate on comfort only. This is not acceptable to [place] at this point, although she is having significant side effects from her treatment.  We discussed proceeding to chemoembolization of her liver lesions. She is  concerned that this well cause significant liver inflammation and possibly damage and lead to admission. I think in fact if she has this procedure done I would probably admit her under my care and make sure she is stable before she is released. However she is not ready to proceed at this point with that procedure, though she understands if there continues to be growth of her tumor in the liver there may, point at which local treatments like chemoembolization may not be feasible.  My suggestion is that we continue exactly what we are doing but at pembrolizumab. She is very concerned about this drug which can have multiple side effects although in my experience patients do tend to tolerated well. She does not feel she can continue the letrozole and she would like to have the palbociclib decreased. She tolerated that much better at 75 mg every other day.  Accordingly the plan is to continue the fulvestrant and then also map, decrease the palbociclib, stop the Femara, and start the pembrolizumab. We again reviewed the possible toxicities, side effects and complications of that agent and she will receive her first dose 07/29/2016.  We are moving her palbociclib and then also map later that week so she does not receive all those agents on the same day.  After she receives 3 pembrolizumab doses, namely over 9 weeks, she will have a repeat MRI of the liver, and of course we are continuing to follow her CA-27-29 on a monthly basis. If there has not been any response (disease control would be a sufficient response) I would strongly recommend she consider the chemoembolization procedure    Chauncey Cruel, MD   07/26/2016 7:50 PM

## 2016-07-25 NOTE — Therapy (Signed)
[place], [place], [address] Phone: [phone]   Fax:  [phone]  Physical Therapy Treatment  Patient Details  Name: [name] [name] MRN: [id number] Date of Birth: June 21, 1960 Referring Provider: Dr. Kyung Rudd  Encounter Date: 07/25/2016      PT End of Session - 07/25/16 1159    Visit Number 55  34 for lymph   Number of Visits 54  for lymph   Date for PT Re-Evaluation 09/15/16   Authorization - Visit Number 85   Authorization - Number of Visits 140   PT Start Time 1020   PT Stop Time 1102   PT Time Calculation (min) 42 min   Activity Tolerance Patient tolerated treatment well   Behavior During Therapy [facility] for tasks assessed/performed      Past Medical History:  Diagnosis Date  . Bone metastases ([place]) dx'd 05/2014  . Breast cancer ([place]) dx'd 2005/2011  . Peripheral vascular disease ([place]) 02/2010   blood clot related to porta cath  . PONV (postoperative nausea and vomiting)   . S/P radiation therapy 07/17/2014 through 08/02/2014    Left mediastinum, left seventh rib 3250 cGy in 13 sessions   . S/P radiation therapy 12/11/2014 through 12/22/2014    Left parietal calvarium 2400 cGy in 8 sessions   . Seizures ([place]) 2010   Isolated incident.    Past Surgical History:  Procedure Laterality Date  . AXILLARY LYMPH NODE DISSECTION  Dec. 2011  . BREAST LUMPECTOMY  2005  . IR GENERIC HISTORICAL  05/21/2016   IR RADIOLOGIST EVAL & MGMT 05/21/2016 Sandi Mariscal, MD [facility] INTERV RAD  . MEDIASTINOTOMY CHAMBERLAIN MCNEIL Left 06/02/2013   Procedure: MEDIASTINOTOMY CHAMBERLAIN MCNEIL;  Surgeon: [name], MD;  Location: [facility];  Service: Thoracic;  Laterality: Left;  LEFT ANTERIOR MEDIASTINOTOMY   . PORTACATH PLACEMENT  12/11  . removal  portacath      There were no vitals filed for this visit.      Subjective Assessment - 07/25/16 1021    Subjective "Kind of worn out."  Pretty tight in both location.   Currently in Pain? Yes   Pain Score 4    Pain Location Groin   Pain Orientation Right   Pain Descriptors / Indicators Tightness   Aggravating Factors  more walking   Pain Relieving Factors myofascial release   Multiple Pain Sites Yes   Pain Score 6   Pain Location Axilla   Pain Orientation Right   Pain Descriptors / Indicators Other (Comment)  full   Aggravating Factors  more swelling   Pain Relieving Factors therapy                         [facility] Adult PT Treatment/Exercise - 07/25/16 0001      Manual Therapy   Myofascial Release right axilla with focus on scar tightness   Manual Lymphatic Drainage (MLD) In left sidelying, posterior interaxillary anastomosis and right axillo-inguinal anastomosis; in supine, short neck, left axilla and anterior interaxillary anastomosis, right groin and axillo-inguinal anastomosis, and right upper extremity from fingers to shoulder.  In right sidelying, left periscapular area toward left groin   Other Manual Therapy soft tissue work at superior aspect of right medial and upper thigh in prone position for pain relief and muscle relaxation, gentle myofascial release at upper thigh, with releases today from far medial to lateral aspects of posterior thigh  PT Short Term Goals - 02/12/16 1226      PT SHORT TERM GOAL #1   Title pain with walking decreased >/= 25%   Time 4   Period Weeks   Status Achieved           PT Long Term Goals - 07/08/16 1324      PT LONG TERM GOAL #1   Title indpendent with HEP   Time 8   Period Weeks   Status On-going     PT LONG TERM GOAL #2   Title pain with walking decreased >/= 75%   Time 8   Period Weeks   Status On-going     PT LONG TERM GOAL #3   Title ability to flex her right hip with  right groin pain decreased >/= 50% due to improved tissue mobility   Time 8   Period Weeks   Status Achieved     PT LONG TERM GOAL #4   Title waking up in middle of night with pain decreased >/=50%   Time 8   Period Weeks   Status Achieved     PT LONG TERM GOAL #5   Title reduction of pain by end of day >/= 50% due to increase tissue mobility   Time 8   Period Weeks   Status On-going           Long Term Clinic Goals - 07/25/16 1201      CC Long Term Goal  #2   Title Pt. will report swelling is adequately managed to enable ADL function at a consistent level.   Status On-going     CC Long Term Goal  #4   Title Pain/discomfort at right axilla area will be controlled at 6/10 or less.   Status On-going     CC Long Term Goal  #5   Title Patient will avoid infection by ongoing management of her lymphedema at right breast/axilla/upper arm areas.   Status On-going     CC Long Term Goal  #6   Title Pain in right groin/ischial tuberosity area will be controlled at 3/10 level or less to enable walking with less pain.   Status On-going            Plan - 07/25/16 1200    Clinical Impression Statement Patient has had a busy week and reports increased tightness in leg and fullness in axilla as a result.  She is worried about the results of an MRI she had yesterday, which she will get later today.  She continues to benefit from therapy.   Rehab Potential Good   Clinical Impairments Affecting Rehab Potential active cancer   PT Frequency 2x / week   PT Duration 12 weeks   PT Treatment/Interventions ADLs/Self Care Home Management;Therapeutic activities;Therapeutic exercise;Patient/family education;Manual techniques;Manual lymph drainage;Scar mobilization   PT Next Visit Plan manual lymph drainage, myofascial release, and soft tissue work.   Consulted and Agree with Plan of Care Patient      Patient will benefit from skilled therapeutic intervention in order to improve the  following deficits and impairments:  Increased edema, Increased fascial restricitons, Pain  Visit Diagnosis: Pain in right thigh  Lymphedema, not elsewhere classified     Problem List Patient Active Problem List   Diagnosis Date Noted  . Liver metastases ([place]) 02/23/2016  . 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 Zoster 04/04/2015  . Nausea with vomiting 11/18/2014  . Constipation 11/18/2014  . Left-sided thoracic back pain   .  Bone metastases ([place]) 11/16/2014  . Back pain 11/15/2014  . Uncontrolled pain 11/14/2014  . Post-lymphadenectomy lymphedema of arm 05/31/2014  . Chest wall pain 03/21/2014  . Abnormal LFTs (liver function tests) 09/12/2013  . Malignant neoplasm of upper-inner quadrant of right breast in female, estrogen receptor positive ([place]) 08/18/2013  . 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

## 2016-07-28 ENCOUNTER — Other Ambulatory Visit: Payer: Self-pay | Admitting: *Deleted

## 2016-07-28 ENCOUNTER — Other Ambulatory Visit: Payer: 59

## 2016-07-28 ENCOUNTER — Ambulatory Visit: Payer: 59 | Admitting: Physical Therapy

## 2016-07-28 DIAGNOSIS — I890 Lymphedema, not elsewhere classified: Secondary | ICD-10-CM

## 2016-07-28 DIAGNOSIS — M79651 Pain in right thigh: Secondary | ICD-10-CM | POA: Diagnosis not present

## 2016-07-28 NOTE — Therapy (Signed)
[place], [place], [address] Phone: [phone]   Fax:  [phone]  Physical Therapy Treatment  Patient Details  Name: [name] [name] MRN: [id number] Date of Birth: 1960-05-12 Referring Provider: Dr. Kyung Rudd  Encounter Date: 07/28/2016      PT End of Session - 07/28/16 1221    Visit Number 48  35 for lymph   Number of Visits 91  for lymph   Date for PT Re-Evaluation 09/15/16   Authorization - Visit Number 36   Authorization - Number of Visits 140   PT Start Time 7062   PT Stop Time 1105   PT Time Calculation (min) 51 min   Activity Tolerance Patient tolerated treatment well   Behavior During Therapy [facility] for tasks assessed/performed      Past Medical History:  Diagnosis Date  . Bone metastases ([place]) dx'd 05/2014  . Breast cancer ([place]) dx'd 2005/2011  . Peripheral vascular disease ([place]) 02/2010   blood clot related to porta cath  . PONV (postoperative nausea and vomiting)   . S/P radiation therapy 07/17/2014 through 08/02/2014    Left mediastinum, left seventh rib 3250 cGy in 13 sessions   . S/P radiation therapy 12/11/2014 through 12/22/2014    Left parietal calvarium 2400 cGy in 8 sessions   . Seizures ([place]) 2010   Isolated incident.    Past Surgical History:  Procedure Laterality Date  . AXILLARY LYMPH NODE DISSECTION  Dec. 2011  . BREAST LUMPECTOMY  2005  . IR GENERIC HISTORICAL  05/21/2016   IR RADIOLOGIST EVAL & MGMT 05/21/2016 Sandi Mariscal, MD [facility] INTERV RAD  . MEDIASTINOTOMY CHAMBERLAIN MCNEIL Left 06/02/2013   Procedure: MEDIASTINOTOMY CHAMBERLAIN MCNEIL;  Surgeon: [name], MD;  Location: [facility];  Service: Thoracic;  Laterality: Left;  LEFT ANTERIOR MEDIASTINOTOMY   . PORTACATH PLACEMENT  12/11  . removal  portacath      There were no vitals filed for this visit.      Subjective Assessment - 07/28/16 1015    Subjective Did more walking up and down steps yesterday.   Currently in Pain? Yes   Pain Score 4    Pain Location Groin   Pain Orientation Right   Pain Descriptors / Indicators Tightness   Aggravating Factors  more walking, steps   Pain Relieving Factors myofascial release   Pain Score 6   Pain Location Axilla   Pain Orientation Right   Pain Descriptors / Indicators Other (Comment)  full   Aggravating Factors  more swelling   Pain Relieving Factors therapy                         [facility] Adult PT Treatment/Exercise - 07/28/16 0001      Manual Therapy   Myofascial Release right axilla with focus on scar tightness   Manual Lymphatic Drainage (MLD) In left sidelying, posterior interaxillary anastomosis and right axillo-inguinal anastomosis; in supine, short neck, left axilla and anterior interaxillary anastomosis, right groin and axillo-inguinal anastomosis, and right upper extremity from fingers to shoulder.  In right sidelying, left periscapular area toward left groin   Other Manual Therapy soft tissue work at superior aspect of right medial and upper thigh in prone position for pain relief and muscle relaxation, gentle myofascial release at upper thigh, with releases today from far medial to lateral aspects of posterior thigh  PT Short Term Goals - 02/12/16 1226      PT SHORT TERM GOAL #1   Title pain with walking decreased >/= 25%   Time 4   Period Weeks   Status Achieved           PT Long Term Goals - 07/08/16 1324      PT LONG TERM GOAL #1   Title indpendent with HEP   Time 8   Period Weeks   Status On-going     PT LONG TERM GOAL #2   Title pain with walking decreased >/= 75%   Time 8   Period Weeks   Status On-going     PT LONG TERM GOAL #3   Title ability to flex her right hip with right groin pain decreased  >/= 50% due to improved tissue mobility   Time 8   Period Weeks   Status Achieved     PT LONG TERM GOAL #4   Title waking up in middle of night with pain decreased >/=50%   Time 8   Period Weeks   Status Achieved     PT LONG TERM GOAL #5   Title reduction of pain by end of day >/= 50% due to increase tissue mobility   Time 8   Period Weeks   Status On-going           Long Term Clinic Goals - 07/25/16 1201      CC Long Term Goal  #2   Title Pt. will report swelling is adequately managed to enable ADL function at a consistent level.   Status On-going     CC Long Term Goal  #4   Title Pain/discomfort at right axilla area will be controlled at 6/10 or less.   Status On-going     CC Long Term Goal  #5   Title Patient will avoid infection by ongoing management of her lymphedema at right breast/axilla/upper arm areas.   Status On-going     CC Long Term Goal  #6   Title Pain in right groin/ischial tuberosity area will be controlled at 3/10 level or less to enable walking with less pain.   Status On-going            Plan - 07/28/16 1222    Clinical Impression Statement Patient got MRI results on Friday that showed her current medication regimen is not keeping the cancer stabilized, so she is currently making decisions about what to do next.  She had more walking on steps yesterday, which increased leg pain, but therapy continues to help her issues.   Rehab Potential Good   Clinical Impairments Affecting Rehab Potential active cancer   PT Frequency 2x / week   PT Duration 12 weeks   PT Treatment/Interventions ADLs/Self Care Home Management;Therapeutic activities;Therapeutic exercise;Patient/family education;Manual techniques;Manual lymph drainage;Scar mobilization   PT Next Visit Plan manual lymph drainage, myofascial release, and soft tissue work.   Consulted and Agree with Plan of Care Patient      Patient will benefit from skilled therapeutic intervention in order  to improve the following deficits and impairments:  Increased edema, Increased fascial restricitons, Pain  Visit Diagnosis: Pain in right thigh  Lymphedema, not elsewhere classified     Problem List Patient Active Problem List   Diagnosis Date Noted  . Liver metastases ([place]) 02/23/2016  . 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 Zoster 04/04/2015  . Nausea with vomiting 11/18/2014  . Constipation 11/18/2014  . Left-sided thoracic back  pain   . Bone metastases ([place]) 11/16/2014  . Back pain 11/15/2014  . Uncontrolled pain 11/14/2014  . Post-lymphadenectomy lymphedema of arm 05/31/2014  . Chest wall pain 03/21/2014  . Abnormal LFTs (liver function tests) 09/12/2013  . Malignant neoplasm of upper-inner quadrant of right breast in female, estrogen receptor positive ([place]) 08/18/2013  . 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

## 2016-07-29 ENCOUNTER — Ambulatory Visit (HOSPITAL_BASED_OUTPATIENT_CLINIC_OR_DEPARTMENT_OTHER): Payer: 59

## 2016-07-29 ENCOUNTER — Encounter: Payer: 59 | Admitting: Physical Therapy

## 2016-07-29 ENCOUNTER — Ambulatory Visit: Payer: 59

## 2016-07-29 VITALS — BP 136/80 | HR 89 | Temp 98.6°F | Resp 18

## 2016-07-29 DIAGNOSIS — C50211 Malignant neoplasm of upper-inner quadrant of right female breast: Secondary | ICD-10-CM | POA: Diagnosis not present

## 2016-07-29 DIAGNOSIS — J910 Malignant pleural effusion: Secondary | ICD-10-CM

## 2016-07-29 DIAGNOSIS — C7951 Secondary malignant neoplasm of bone: Secondary | ICD-10-CM

## 2016-07-29 DIAGNOSIS — Z5111 Encounter for antineoplastic chemotherapy: Secondary | ICD-10-CM | POA: Diagnosis not present

## 2016-07-29 MED ORDER — SODIUM CHLORIDE 0.9 % IV SOLN
Freq: Once | INTRAVENOUS | Status: AC
  Administered 2016-07-29: 15:00:00 via INTRAVENOUS

## 2016-07-29 MED ORDER — SODIUM CHLORIDE 0.9 % IV SOLN
200.0000 mg | Freq: Once | INTRAVENOUS | Status: AC
  Administered 2016-07-29: 200 mg via INTRAVENOUS
  Filled 2016-07-29: qty 8

## 2016-07-29 NOTE — Patient Instructions (Signed)
[facility] Discharge Instructions for Patients Receiving Chemotherapy  Today you received the following chemotherapy agents Keytruda (Pembrolizumab)  To help prevent nausea and vomiting after your treatment, we encourage you to take your nausea medication as prescribed. If you develop nausea and vomiting that is not controlled by your nausea medication, call the clinic.   BELOW ARE SYMPTOMS THAT SHOULD BE REPORTED IMMEDIATELY:  *FEVER GREATER THAN 100.5 F  *CHILLS WITH OR WITHOUT FEVER  NAUSEA AND VOMITING THAT IS NOT CONTROLLED WITH YOUR NAUSEA MEDICATION  *UNUSUAL SHORTNESS OF BREATH  *UNUSUAL BRUISING OR BLEEDING  TENDERNESS IN MOUTH AND THROAT WITH OR WITHOUT PRESENCE OF ULCERS  *URINARY PROBLEMS  *BOWEL PROBLEMS  UNUSUAL RASH Items with * indicate a potential emergency and should be followed up as soon as possible.  Feel free to call the clinic you have any questions or concerns. The clinic phone number is [phone].  Please show the [facility] at check-in to the Emergency Department and triage nurse.  Pembrolizumab injection Beryle Flock) What is this medicine? PEMBROLIZUMAB (pem broe liz ue mab) is a monoclonal antibody. It is used to treat melanoma, head and neck cancer, Hodgkin lymphoma, non-small cell lung cancer, urothelial cancer, stomach cancer, and cancers that have a certain genetic condition. This medicine may be used for other purposes; ask your health care provider or pharmacist if you have questions. COMMON BRAND NAME(S): Keytruda What should I tell my health care provider before I take this medicine? They need to know if you have any of these conditions: -diabetes -immune system problems -inflammatory bowel disease -liver disease -lung or breathing disease -lupus -organ transplant -an unusual or allergic reaction to pembrolizumab, other medicines, foods, dyes, or preservatives -pregnant or trying to get  pregnant -breast-feeding How should I use this medicine? This medicine is for infusion into a vein. It is given by a health care professional in a hospital or clinic setting. A special MedGuide will be given to you before each treatment. Be sure to read this information carefully each time. Talk to your pediatrician regarding the use of this medicine in children. While this drug may be prescribed for selected conditions, precautions do apply. Overdosage: If you think you have taken too much of this medicine contact a poison control center or emergency room at once. NOTE: This medicine is only for you. Do not share this medicine with others. What if I miss a dose? It is important not to miss your dose. Call your doctor or health care professional if you are unable to keep an appointment. What may interact with this medicine? Interactions have not been studied. Give your health care provider a list of all the medicines, herbs, non-prescription drugs, or dietary supplements you use. Also tell them if you smoke, drink alcohol, or use illegal drugs. Some items may interact with your medicine. This list may not describe all possible interactions. Give your health care provider a list of all the medicines, herbs, non-prescription drugs, or dietary supplements you use. Also tell them if you smoke, drink alcohol, or use illegal drugs. Some items may interact with your medicine. What should I watch for while using this medicine? Your condition will be monitored carefully while you are receiving this medicine. You may need blood work done while you are taking this medicine. Do not become pregnant while taking this medicine or for 4 months after stopping it. Women should inform their doctor if they wish to become pregnant or think they might be  pregnant. There is a potential for serious side effects to an unborn child. Talk to your health care professional or pharmacist for more information. Do not breast-feed  an infant while taking this medicine or for 4 months after the last dose. What side effects may I notice from receiving this medicine? Side effects that you should report to your doctor or health care professional as soon as possible: -allergic reactions like skin rash, itching or hives, swelling of the face, lips, or tongue -bloody or black, tarry -breathing problems -changes in vision -chest pain -chills -constipation -cough -dizziness or feeling faint or lightheaded -fast or irregular heartbeat -fever -flushing -hair loss -low blood counts - this medicine may decrease the number of white blood cells, red blood cells and platelets. You may be at increased risk for infections and bleeding. -muscle pain -muscle weakness -persistent headache -signs and symptoms of high blood sugar such as dizziness; dry mouth; dry skin; fruity breath; nausea; stomach pain; increased hunger or thirst; increased urination -signs and symptoms of kidney injury like trouble passing urine or change in the amount of urine -signs and symptoms of liver injury like dark urine, light-colored stools, loss of appetite, nausea, right upper belly pain, yellowing of the eyes or skin -stomach pain -sweating -weight loss Side effects that usually do not require medical attention (report to your doctor or health care professional if they continue or are bothersome): -decreased appetite -diarrhea -tiredness This list may not describe all possible side effects. Call your doctor for medical advice about side effects. You may report side effects to FDA at 1-800-FDA-1088. Where should I keep my medicine? This drug is given in a hospital or clinic and will not be stored at home. NOTE: This sheet is a summary. It may not cover all possible information. If you have questions about this medicine, talk to your doctor, pharmacist, or health care provider.  2018 Elsevier/Gold Standard (2015-12-25 12:29:36)

## 2016-07-30 ENCOUNTER — Ambulatory Visit: Payer: 59

## 2016-07-30 ENCOUNTER — Telehealth: Payer: Self-pay

## 2016-07-30 NOTE — Telephone Encounter (Signed)
Chemo f/u call after Keytruda.  Pt reports feeling fatigued, but no other problems or concerns.

## 2016-08-01 ENCOUNTER — Ambulatory Visit: Payer: 59 | Attending: Oncology | Admitting: Physical Therapy

## 2016-08-01 ENCOUNTER — Ambulatory Visit (HOSPITAL_BASED_OUTPATIENT_CLINIC_OR_DEPARTMENT_OTHER): Payer: 59 | Admitting: Oncology

## 2016-08-01 ENCOUNTER — Other Ambulatory Visit (HOSPITAL_BASED_OUTPATIENT_CLINIC_OR_DEPARTMENT_OTHER): Payer: 59

## 2016-08-01 ENCOUNTER — Ambulatory Visit (HOSPITAL_BASED_OUTPATIENT_CLINIC_OR_DEPARTMENT_OTHER): Payer: 59

## 2016-08-01 VITALS — BP 133/85 | HR 78 | Temp 97.3°F | Resp 20

## 2016-08-01 DIAGNOSIS — M79651 Pain in right thigh: Secondary | ICD-10-CM

## 2016-08-01 DIAGNOSIS — I890 Lymphedema, not elsewhere classified: Secondary | ICD-10-CM

## 2016-08-01 DIAGNOSIS — C771 Secondary and unspecified malignant neoplasm of intrathoracic lymph nodes: Secondary | ICD-10-CM

## 2016-08-01 DIAGNOSIS — C787 Secondary malignant neoplasm of liver and intrahepatic bile duct: Secondary | ICD-10-CM

## 2016-08-01 DIAGNOSIS — M62838 Other muscle spasm: Secondary | ICD-10-CM | POA: Diagnosis present

## 2016-08-01 DIAGNOSIS — Z5111 Encounter for antineoplastic chemotherapy: Secondary | ICD-10-CM

## 2016-08-01 DIAGNOSIS — Z79811 Long term (current) use of aromatase inhibitors: Secondary | ICD-10-CM

## 2016-08-01 DIAGNOSIS — Z170 Estrogen receptor positive status [ER+]: Secondary | ICD-10-CM

## 2016-08-01 DIAGNOSIS — C50211 Malignant neoplasm of upper-inner quadrant of right female breast: Secondary | ICD-10-CM

## 2016-08-01 DIAGNOSIS — M25551 Pain in right hip: Secondary | ICD-10-CM | POA: Insufficient documentation

## 2016-08-01 DIAGNOSIS — Z79899 Other long term (current) drug therapy: Secondary | ICD-10-CM

## 2016-08-01 DIAGNOSIS — C7951 Secondary malignant neoplasm of bone: Secondary | ICD-10-CM

## 2016-08-01 DIAGNOSIS — R2689 Other abnormalities of gait and mobility: Secondary | ICD-10-CM | POA: Insufficient documentation

## 2016-08-01 LAB — COMPREHENSIVE METABOLIC PANEL
ALT: 78 U/L — ABNORMAL HIGH (ref 0–55)
AST: 59 U/L — AB (ref 5–34)
Albumin: 4.3 g/dL (ref 3.5–5.0)
Alkaline Phosphatase: 114 U/L (ref 40–150)
Anion Gap: 12 mEq/L — ABNORMAL HIGH (ref 3–11)
BUN: 13.1 mg/dL (ref 7.0–26.0)
CHLORIDE: 98 meq/L (ref 98–109)
CO2: 28 mEq/L (ref 22–29)
Calcium: 10.2 mg/dL (ref 8.4–10.4)
Creatinine: 0.9 mg/dL (ref 0.6–1.1)
EGFR: 68 mL/min/{1.73_m2} — AB (ref >90)
GLUCOSE: 67 mg/dL — AB (ref 70–140)
POTASSIUM: 3.7 meq/L (ref 3.5–5.1)
SODIUM: 138 meq/L (ref 136–145)
Total Bilirubin: 0.41 mg/dL (ref 0.20–1.20)
Total Protein: 7.6 g/dL (ref 6.4–8.3)

## 2016-08-01 LAB — CBC WITH DIFFERENTIAL/PLATELET
BASO%: 1.9 % (ref 0.0–2.0)
Basophils Absolute: 0.1 10*3/uL (ref 0.0–0.1)
EOS%: 2.6 % (ref 0.0–7.0)
Eosinophils Absolute: 0.1 10*3/uL (ref 0.0–0.5)
HCT: 35.3 % (ref 34.8–46.6)
HGB: 12.2 g/dL (ref 11.6–15.9)
LYMPH%: 14.3 % (ref 14.0–49.7)
MCH: 38 pg — ABNORMAL HIGH (ref 25.1–34.0)
MCHC: 34.5 g/dL (ref 31.5–36.0)
MCV: 110.1 fL — ABNORMAL HIGH (ref 79.5–101.0)
MONO#: 0.3 10*3/uL (ref 0.1–0.9)
MONO%: 12.5 % (ref 0.0–14.0)
NEUT#: 1.8 10*3/uL (ref 1.5–6.5)
NEUT%: 68.7 % (ref 38.4–76.8)
Platelets: 302 10*3/uL (ref 145–400)
RBC: 3.21 10*6/uL — AB (ref 3.70–5.45)
RDW: 14.3 % (ref 11.2–14.5)
WBC: 2.7 10*3/uL — ABNORMAL LOW (ref 3.9–10.3)
lymph#: 0.4 10*3/uL — ABNORMAL LOW (ref 0.9–3.3)

## 2016-08-01 LAB — TSH: TSH: 4.109 m(IU)/L — ABNORMAL HIGH (ref 0.308–3.960)

## 2016-08-01 MED ORDER — DENOSUMAB 120 MG/1.7ML ~~LOC~~ SOLN
120.0000 mg | Freq: Once | SUBCUTANEOUS | Status: AC
  Administered 2016-08-01: 120 mg via SUBCUTANEOUS
  Filled 2016-08-01: qty 1.7

## 2016-08-01 MED ORDER — FULVESTRANT 250 MG/5ML IM SOLN
500.0000 mg | Freq: Once | INTRAMUSCULAR | Status: AC
  Administered 2016-08-01: 500 mg via INTRAMUSCULAR
  Filled 2016-08-01: qty 10

## 2016-08-01 NOTE — Progress Notes (Signed)
[place]  [phone] [phone]     ID: [name] OB: Oct 25, 1960  MR#: [id number]  CNO#:[id number]  PCP: System, Pcp Not In GYN:  [name] SU:  OTHER MD: [name], [name], [name], [name], Casey Burkitt   CHIEF COMPLAINT: Stage IV breast cancer  CURRENT TREATMENT: Fulvestrant, denosumab, palbociclib, letrozole, pembrolizumab  INTERVAL HISTORY:   Nyelli returns today for follow-up of her metastatic estrogen receptor positive breast cancer accompanied by her husband [name]. Erlene received her first dose of pembrolizumab 3 days ago. She was very concerned regarding possible side effects and toxicities. However she did well with the treatment. She has had no diarrhea, in fact her bowel movements tended to normalize (she has been off the letrozole for a week and she feels that causes her to have loose yellow bowel movements). She did have a bad taste during the treatment and she felt tired that day, possibly secondary to stress. She had a mild headache. She felt dehydrated. Aside from these issues she did not notice any acute side effects from her treatment.  REVIEW OF SYiSTEMS: Nekeya remains mildly nauseated. She is not taking the palbociclib every other day and that is helping. She tells me that she has had more frequent headaches, and that the headaches are more persistent, although not much more intense. She occasionally has some blurred vision. She sometimes feels a little dizzy. These are all relatively new symptoms and are not going to be related to the pembrolizumab, since his symptoms were occurring prior to those treatments. Aside from that a detailed review of systems today was stable  BREAST CANCER HISTORY: From doctor [name]'s intake note 03/20/2004:  "The patient is a very pleasant 56 year old female, without significant past medical history.  Her family history is  significant for a sister who at age 52 was diagnosed with invasive ductal carcinoma.  She is a breast cancer survivor at age 56 now.  The patient states that she has never really had a screening mammogram until October 2005, when she felt that it was time for her to start having mammograms done on a yearly basis.  Therefore, on 01/26/04, she underwent a screening mammogram and an abnormality was detected in the upper outer right breast.  She, therefore, underwent spot compression views of both the right and the left breast.  The left breast revealed a well-defined mass in the upper outer left quadrant, present at the 2 o'clock position, measuring 1.8 cm, 6 cm from the nipple.  This, by ultrasound, was felt to be a simple cyst measuring 1.8 cm.  On the right breast, a spiculated mass was noted in the upper outer right quadrant.  The ultrasound revealed a shadowing irregular solid mass at the 10:30 position, 9 cm from the nipple, measuring 1.2 cm in greatest dimension, correlating with the spiculated mass seen on the mammogram.  The right axilla was negative ultrasonically.  Because of this, the patient underwent a needle biopsy of the right breast and the biopsy was positive invasive mammary carcinoma that showed features consistent with a high-grade invasive ductal carcinoma associated with desmoplastic stroma.  No in situ component was seen and no definite lymphovascular invasion was identified.  On the core biopsy, the tumor measured about 0.8 cm.  Because of this, she was seen by Dr. [name] and the patient was taken to the [facility] on March 15, 2004.  She underwent a right breast lumpectomy  with sentinel node biopsy.  The final pathology revealed an invasive ductal carcinoma, measuring 1.7 cm, grade 2 of 3.  Margins were free of tumor.  Atypical lobular hyperplasia was noted.  One sentinel node was removed which was negative for metastatic disease.  The tumor was staged at T1c, N0 MX.  It was estrogen  receptor positive, progesterone receptor positive.  HER-2/neu was 2+.  FISH was negative.  All margins were free of tumor.  She is now seen in Medical Oncology for further evaluation and management of this newly diagnosed T1c, node negative, stage I, invasive ductal carcinoma of the right breast."  Her subsequent history is as detailed below   PAST MEDICAL HISTORY: Past Medical History:  Diagnosis Date  . Bone metastases ([facility]) dx'd 05/2014  . Breast cancer ([place]) dx'd 2005/2011  . Peripheral vascular disease ([facility]) 02/2010   blood clot related to porta cath  . PONV (postoperative nausea and vomiting)   . S/P radiation therapy 07/17/2014 through 08/02/2014    Left mediastinum, left seventh rib 3250 cGy in 13 sessions   . S/P radiation therapy 12/11/2014 through 12/22/2014    Left parietal calvarium 2400 cGy in 8 sessions   . Seizures ([facility]) 2010   Isolated incident.    PAST SURGICAL HISTORY: Past Surgical History:  Procedure Laterality Date  . AXILLARY LYMPH NODE DISSECTION  Dec. 2011  . BREAST LUMPECTOMY  2005  . IR GENERIC HISTORICAL  05/21/2016   IR RADIOLOGIST EVAL & MGMT 05/21/2016 Sandi Mariscal, MD [facility] INTERV RAD  . MEDIASTINOTOMY CHAMBERLAIN MCNEIL Left 06/02/2013   Procedure: MEDIASTINOTOMY CHAMBERLAIN MCNEIL;  Surgeon: [name], MD;  Location: [facility];  Service: Thoracic;  Laterality: Left;  LEFT ANTERIOR MEDIASTINOTOMY   . PORTACATH PLACEMENT  12/11  . removal portacath      FAMILY HISTORY Family History  Problem Relation Age of Onset  . COPD Mother   . Breast cancer Sister 78   The patient's father is living, 76 years old as of may 2015. He lives in [place]. The patient's mother died from complications of COPD at the age of 55. These has 2 brothers, one sister. Her sister developed breast cancer at the age of 62.  She is doing well. The patient herself underwent genetic testing at [facility] in 2011 and was found to be BRCA negative  GYNECOLOGIC HISTORY:  Menarche age 10, she is GX P0. She stopped having periods with her initial chemotherapy in 2006.  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 Her husband [name] is a Medical illustrator in [place]. He has a child from a prior marriage. At home they have 2 rescue dogs, Hobo and [place]. The patient is religious but not a church attender    ADVANCED DIRECTIVES: In place; at the 08/04/2014 visit in particular the patient was very clear, with her husband present, that she would not want any kind of feeding tubes or "other tubes" if her condition deteriorated.   HEALTH MAINTENANCE: Social History  Substance Use Topics  . Smoking status: Never Smoker  . Smokeless tobacco: Never Used  . Alcohol use No     Colonoscopy:  PAP:  Bone density: March 2015; mild osteopenia  Lipid panel:  Allergies  Allergen Reactions  . 2nd Skin Quick Heal Other (See Comments)    Other Reaction: Skin peels  . Decadron [Dexamethasone] Other (See Comments)    Patient does not tolerate  steroids.   . Dilaudid [Hydromorphone] Nausea And Vomiting  . Enoxaparin Other (See Comments)    unknown  . Fluconazole Swelling    Liver toxicity  . Hydromorphone Hcl Nausea And Vomiting  . Morphine And Related Nausea And Vomiting  . Protonix [Pantoprazole Sodium] Other (See Comments)    Patient reports it caused thrush.  . Tegaderm Ag Mesh [Silver]     Current Outpatient Prescriptions  Medication Sig Dispense Refill  . ALPRAZolam (XANAX) 0.5 MG tablet Take 1 tablet (0.5 mg total) by mouth 2 (two) times daily as needed for anxiety. 30 tablet 0  . B Complex-C (B-COMPLEX WITH VITAMIN C) tablet Take 1 tablet by mouth daily. Reported on 03/27/2015    . calcium carbonate (TUMS - DOSED IN MG ELEMENTAL CALCIUM) 500 MG  chewable tablet Chew 1 tablet by mouth as directed.    . cholecalciferol 2000 UNITS tablet Take 1 tablet (2,000 Units total) by mouth daily.    . Diphenhyd-Hydrocort-Nystatin (FIRST-DUKES MOUTHWASH) SUSP 5-10 ml qid SWISH AND SPIT 409 mL 3  . folic acid (FOLVITE) 1 MG tablet Take 1 tablet (1 mg total) by mouth daily.    Marland Kitchen loratadine-pseudoephedrine (CLARITIN-D 24-HOUR) 10-240 MG 24 hr tablet Take 1 tablet by mouth daily.    . Melatonin 3 MG TABS Take 3 mg by mouth at bedtime.    . naproxen sodium (ANAPROX) 220 MG tablet Take 220 mg by mouth 2 (two) times daily with a meal.    . palbociclib (IBRANCE) 75 MG capsule Take 1 capsule (75 mg total) by mouth every other day. Take whole with food. 21 capsule 6  . phenazopyridine (PYRIDIUM) 100 MG tablet Take 1 tablet (100 mg total) by mouth 3 (three) times daily as needed for pain. 30 tablet 0  . RABEprazole Sodium 5 MG CPSP Take 5 mg by mouth daily. 30 capsule 3  . saccharomyces boulardii (FLORASTOR) 250 MG capsule Take 250 mg by mouth daily.     . vitamin E (VITAMIN E) 400 UNIT capsule Take 1 capsule (400 Units total) by mouth 2 (two) times daily. 60 capsule 3   No current facility-administered medications for this visit.     OBJECTIVE: Middle-aged white woman In no acute distress  There were no vitals filed for this visit.   There is no height or weight on file to calculate BMI.   Patient refused vitals 08/03/16    ECOG FS: 1  Sclerae unicteric, EOMs intact Oropharynx clear and moist No cervical or supraclavicular adenopathy Lungs no rales or rhonchi Heart regular rate and rhythm Abd soft, nontender, positive bowel sounds MSK no focal spinal tenderness, no upper extremity lymphedema Neuro: nonfocal, well oriented, appropriate affect Breasts: Deferred   LAB RESULTS:   CMP     Component Value Date/Time   NA 138 08/01/2016 1140   K 3.7 08/01/2016 1140   CL 103 12/14/2014 0800   CL 105 05/06/2012 1333   CO2 28 08/01/2016 1140    GLUCOSE 67 (L) 08/01/2016 1140   GLUCOSE 124 (H) 05/06/2012 1333   BUN 13.1 08/01/2016 1140   CREATININE 0.9 08/01/2016 1140   CALCIUM 10.2 08/01/2016 1140   PROT 7.6 08/01/2016 1140   ALBUMIN 4.3 08/01/2016 1140   AST 59 (H) 08/01/2016 1140   ALT 78 (H) 08/01/2016 1140   ALKPHOS 114 08/01/2016 1140   BILITOT 0.41 08/01/2016 1140   GFRNONAA >60 12/14/2014 0800   GFRAA >60 12/14/2014 0800    No results found for: SPEP  Lab Results  Component Value Date   WBC 2.7 (L) 08/01/2016   NEUTROABS 1.8 08/01/2016   HGB 12.2 08/01/2016   HCT 35.3 08/01/2016   MCV 110.1 (H) 08/01/2016   PLT 302 08/01/2016      Chemistry      Component Value Date/Time   NA 138 08/01/2016 1140   K 3.7 08/01/2016 1140   CL 103 12/14/2014 0800   CL 105 05/06/2012 1333   CO2 28 08/01/2016 1140   BUN 13.1 08/01/2016 1140   CREATININE 0.9 08/01/2016 1140      Component Value Date/Time   CALCIUM 10.2 08/01/2016 1140   ALKPHOS 114 08/01/2016 1140   AST 59 (H) 08/01/2016 1140   ALT 78 (H) 08/01/2016 1140   BILITOT 0.41 08/01/2016 1140     No results for input(s): INR in the last 168 hours.  Urinalysis    Component Value Date/Time   COLORURINE YELLOW 11/17/2014 0143   APPEARANCEUR CLOUDY (A) 11/17/2014 0143   LABSPEC 1.005 05/16/2016 1201   PHURINE 6.5 05/16/2016 1201   PHURINE 6.5 11/17/2014 0143   GLUCOSEU Negative 05/16/2016 1201   HGBUR Negative 05/16/2016 1201   HGBUR NEGATIVE 11/17/2014 0143   BILIRUBINUR Negative 05/16/2016 1201   KETONESUR Negative 05/16/2016 1201   KETONESUR NEGATIVE 11/17/2014 0143   PROTEINUR Negative 05/16/2016 1201   PROTEINUR NEGATIVE 11/17/2014 0143   UROBILINOGEN 0.2 05/16/2016 1201   NITRITE Negative 05/16/2016 1201   NITRITE NEGATIVE 11/17/2014 0143   LEUKOCYTESUR Small 05/16/2016 1201     STUDIES: Mr Liver W Wo Contrast  Result Date: 07/24/2016 CLINICAL DATA:  Metastatic breast cancer with known left pleural, hepatic, and abdominal nodal  metastasis. EXAM: MRI ABDOMEN WITHOUT AND WITH CONTRAST TECHNIQUE: Multiplanar multisequence MR imaging of the abdomen was performed both before and after the administration of intravenous contrast. CONTRAST:  76m MULTIHANCE GADOBENATE DIMEGLUMINE 529 MG/ML IV SOLN COMPARISON:  PET of 05/14/2016.  Abdominal MRI of 05/08/2016. FINDINGS: Portions of exam are minimally motion degraded. Lower chest: Normal heart size. Left-sided pleural thickening and nodularity are again identified. An index medial left pleural enhancing nodule measures 1.5 cm on image 17/ series 901 and is similar. A more anterior inferior pleural nodule measures 1.4 cm on image 19/ series 901 and is not significantly changed. Similar left base atelectasis. Hepatobiliary: Moderate hepatic steatosis. Hepatic metastasis are again identified bilaterally. These are most directly compared on the early post-contrast enhancement series 901 today versus image 121308of the prior. Central right hepatic lobe lesion measures 3.5 x 2.8 cm on image 43 today versus 2.7 x 2.6 cm on the prior. Subcapsular more inferior right hepatic lobe lesion measures 5.2 x 4.1 cm on image 51 today versus 4.9 x 3.9 cm on the prior. Pericholecystic right liver lobe lesion measures 3.8 x 2.9 cm on image 60 today versus 2.9 x 2.1 cm on the prior. 10 mm gallstone without acute cholecystitis or biliary duct dilatation. Pancreas:  Normal, without mass or ductal dilatation. Spleen:  Normal in size, without focal abnormality. Adrenals/Urinary Tract: Normal adrenal glands. Normal kidneys, without hydronephrosis. Stomach/Bowel: Normal stomach and abdominal bowel loops. Vascular/Lymphatic: Normal caliber of the aorta and branch vessels. Circumaortic left renal vein. Index left periaortic node is similar to decreased in size at 8 x 8 mm on image 51/ series 901. Compare 9 x 11 mm on the prior. However, there are enlarged hyperenhancing nodes in the porta hepatis. Example at 1.4 cm anteriorly on  image 25/ series 3 (1.1 cm  on the prior.) 1.4 cm on image 26/series 3 in the portal caval space. Compare 0.8 cm on the prior. Other:  No ascites.  No evidence of omental or peritoneal disease. Musculoskeletal: Osseous metastasis are again identified. A T2 hyperintense T11 lesion is unchanged at 1.5 cm on image 21/series 3. A right-sided lamina lesion at L1 measures 10 mm today versus 11 mm on the prior. Inferior L3 lesion measures 2.9 x 1.8 cm on image 25/series 11 today. Compare 3.1 x 1.9 cm at the same level on the prior. IMPRESSION: 1. Since the prior MRI of 05/08/2016, mild progression of hepatic metastasis. 2. Enlargement of porta hepatis nodes, consistent with progressive nodal metastasis. 3. Similar to slight improvement in osseous metastasis. 4. Similar left pleural metastasis. 5. Cholelithiasis. Electronically Signed   By: Abigail Miyamoto M.D.   On: 07/24/2016 16:21    ASSESSMENT: 56 y.o. BRCA negative [place] woman with stage IV breast cancer, history as follows  (1)  S/p Right upper inner quadrant lumpectomy and sentinel lymph node sampling 03/15/2004 for a pT1c pN0. Stage IA invasive ductal carcinoma, grade 2, estrogen receptor 95% positive, progesterone receptor 65% positive, HER-2 not amplified; additional surgery 04/25/2004 for seroma or clearance showed no residual tumor  (2) adjuvant chemotherapy with cyclophosphamide and doxorubicin every 21 days x4 completed 07/19/2004  (3) adjuvant radiation given under Dr. Donella Stade in [place] completed July 2006  (4) the patient opted against adjuvant antiestrogen therapy  (5) genetics testing showed no BRCA mutations  (6) biopsy of a palpable right axillary mass 10/24/2009 showed invasive ductal carcinoma, grade 3, estrogen receptor 100% positive, progesterone receptor 2% positive (alert score 5) HER-2 negative; no evidence of systemic disease on PET scanning  (7) completed 3 of 4 planned cycles of docetaxel and cyclophosphamide September  2011, fourth cycle omitted because of marked elevations in liver function tests  (8) an right axillary lymph node dissection 03/06/2010 showed 3/8 lymph nodes removed to be involved by tumor, with extracapsular extension.  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, TTS-1 negative), estrogen receptor 80% positive, progesterone receptor 2% positive, HER-2 not amplified  (12) letrozole started March 2015-- tolerated with significant side effects, discontinued at the end of May 2015  (13) PET scan 08/16/2013 shows extensive left pleural metastatic disease and a large left pleural effusion that shifts cardiac and mediastinal structures to the right; adenopathy (celiac trunk, periadrenal, periaortic); and a left medial clavicular lesion; Status post left thoracentesis 08/16/2013 positive for adenocarcinoma, estrogen receptor positive, progesterone receptor negative.  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; everolimus was suggested as an available targeted agent  (18) exemestane started 03/31/2014, discontinued 10/31/2014 with evidence of  progression  (a) everolimus added 04/03/2014 but not tolerated (cytopenias, elevated LFTs) even at minimal doses; stopped 04/17/2014  (  19) fulvestrant started 12/20/2014  (a) palbociclib added at very low dose 04/03/2015 (starting dose 75 mg weekly)  (b) palbociclib dose gradually increased to 75 mg daily, 21/7, as of May 2017  (c) palbociclib dose increased to 100 mg daily, 21/ 7, beginning November mid- cycle  (d) palbociclib dose decreased to 75 mg daily beginning with cycle starting 05/25/2016  (e) letrozole 2.5 mg started 05/26/2016, held as of 07/25/2016 with poor tolerance  (f) palbociclib dose decreased to 75 mg every other day beginning 07/25/2016  (20) liver biopsy 03/20/2015 confirms metastatic carcinoma, still estrogen receptor positive at 100%, progesterone receptor negative, HER-2 equivocal with a signals ratio 1.41, number per cell 4.50.   (a) repeat liver biopsy December 2017 might show further changes (HER-2 positivity)?  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 treated with Lovenox for 5-6 months  (c) right upper extremity lymphedema--receiving physical therapy  (d) hepatic steatosis with chronically elevated LFTs as well as unusual hepatic sensitivity to chemotherapy  (e) osteopenia with the lowest T score -1.6 on bone density scan 06/20/2013  (i) on denosumab/  Xgeva Q28d  (f) radiation oncology (Dr Valere Dross) has reviewed prior radiation records in case there is further mediastinal involvement with dysphagia etc in which case palliative XRT could be considered  (a) radiation to left mediastinum/ left 7th rib 3250 cGy in 13 sessions04/18/2016 through 08/02/2014  (b) radiation to T11 area: 22 Gy in 7 sessions, last dose 11/27/2014  (c) radiation left parietal scalp region to be completed 12/22/2014  (d) radiation to sacral area completed 04/09/2015  (e) radiation to right inferior pelvis and left ninth rib (24 gray, 12/24/2015--01/02/2016)  (f) T11 was treated stereotactically with 14 gray in 1 fraction 03/12/2016.  (g) chest wall and perineal pain--improved post radiation treatments  (a) discussed celebrex/ carafate but demurs  (h) zoster diagnosed 04/04/2015-- on valacyclovir--resolved   PLAN: Elica tolerated the first dose of pembrolizumab without any obvious problems and the plan is going to be to continue that every 21 days. We reviewed her other medications. Her palbociclib has been decreased to every other day. She is now on the second week of the current cycle. We are continuing the fulvestrant and denosumab/Xgeva every 4 weeks and she received a dose today.  Given the minimal changes in her liver function tests I think she should go ahead and resume the letrozole on 08/06/2016.  The plan will then be to continue the current treatment for at least 2 and preferably 3 more pembrolizumab cycles after which point we will restage her with a repeat liver MRI. If we do not have at least disease control I will strongly urged her to consider chemoembolization  I am concerned about her headaches, although these are mild. There are associated with some vague symptoms which  could possibly be related to brain involvement. We have not had a brain MRI for more than a year. I am going to try to get that scheduled for her within the next few days just to  make sure we don't get sites wiped.  Otherwise I have scheduled her treatments and she will return to see me the first day in June. She knows to call for any problems that may develop before her next visit here.    Chauncey Cruel, MD   08/03/2016 11:38 AM

## 2016-08-01 NOTE — Therapy (Signed)
[place], [place], [address] Phone: [phone]   Fax:  [phone]  Physical Therapy Treatment  Patient Details  Name: [name] [name] MRN: [id number] Date of Birth: [birth year] Referring Provider: Dr. Kyung Rudd  Encounter Date: 08/01/2016      PT End of Session - 08/01/16 1123    Visit Number 62  36 for lymph   Number of Visits 81  for lymph   Date for PT Re-Evaluation 09/15/16   Authorization - Visit Number 16   Authorization - Number of Visits 140   PT Start Time 1020   PT Stop Time 1105   PT Time Calculation (min) 45 min   Activity Tolerance Patient tolerated treatment well   Behavior During Therapy [facility] for tasks assessed/performed      Past Medical History:  Diagnosis Date  . Bone metastases ([place]) dx'd 05/2014  . Breast cancer ([place]) dx'd 2005/2011  . Peripheral vascular disease ([place]) 02/2010   blood clot related to porta cath  . PONV (postoperative nausea and vomiting)   . S/P radiation therapy 07/17/2014 through 08/02/2014    Left mediastinum, left seventh rib 3250 cGy in 13 sessions   . S/P radiation therapy 12/11/2014 through 12/22/2014    Left parietal calvarium 2400 cGy in 8 sessions   . Seizures ([place]) 2010   Isolated incident.    Past Surgical History:  Procedure Laterality Date  . AXILLARY LYMPH NODE DISSECTION  Dec. 2011  . BREAST LUMPECTOMY  2005  . IR GENERIC HISTORICAL  05/21/2016   IR RADIOLOGIST EVAL & MGMT 05/21/2016 Sandi Mariscal, MD [facility] INTERV RAD  . MEDIASTINOTOMY CHAMBERLAIN MCNEIL Left 06/02/2013   Procedure: MEDIASTINOTOMY CHAMBERLAIN MCNEIL;  Surgeon: [name], MD;  Location: [facility];  Service: Thoracic;  Laterality: Left;  LEFT ANTERIOR MEDIASTINOTOMY   . PORTACATH PLACEMENT  12/11  . removal  portacath      There were no vitals filed for this visit.      Subjective Assessment - 08/01/16 1120    Subjective Has had a very busy week.  Did start the [facility] treatment on Tuesday.  Did a lot of walking yesterday.   Currently in Pain? Yes   Pain Score --  "high"   Pain Location Groin   Pain Orientation Right   Pain Descriptors / Indicators Tightness   Pain Type Chronic pain   Aggravating Factors  more walking   Pain Relieving Factors myofascial release   Pain Score 6   Pain Location Axilla   Pain Orientation Right   Pain Descriptors / Indicators Other (Comment)  seizing up   Aggravating Factors  more swelling   Pain Relieving Factors therapy                         [facility] Adult PT Treatment/Exercise - 08/01/16 0001      Manual Therapy   Myofascial Release right axilla with focus on scar tightness   Manual Lymphatic Drainage (MLD) In left sidelying, posterior interaxillary anastomosis and right axillo-inguinal anastomosis; in supine, short neck, left axilla and anterior interaxillary anastomosis, right groin and axillo-inguinal anastomosis, and right upper extremity from fingers to shoulder.  In right sidelying, left periscapular area toward left groin   Other Manual Therapy soft tissue work at superior aspect of right medial and upper thigh in prone position for pain relief and muscle relaxation, gentle myofascial release at upper thigh, with releases today from far medial  to lateral aspects of posterior thigh                  PT Short Term Goals - 02/12/16 1226      PT SHORT TERM GOAL #1   Title pain with walking decreased >/= 25%   Time 4   Period Weeks   Status Achieved           PT Long Term Goals - 07/08/16 1324      PT LONG TERM GOAL #1   Title indpendent with HEP   Time 8   Period Weeks   Status On-going     PT LONG TERM GOAL #2   Title pain with walking decreased >/= 75%   Time 8   Period Weeks   Status On-going      PT LONG TERM GOAL #3   Title ability to flex her right hip with right groin pain decreased >/= 50% due to improved tissue mobility   Time 8   Period Weeks   Status Achieved     PT LONG TERM GOAL #4   Title waking up in middle of night with pain decreased >/=50%   Time 8   Period Weeks   Status Achieved     PT LONG TERM GOAL #5   Title reduction of pain by end of day >/= 50% due to increase tissue mobility   Time 8   Period Weeks   Status On-going           Long Term Clinic Goals - 07/25/16 1201      CC Long Term Goal  #2   Title Pt. will report swelling is adequately managed to enable ADL function at a consistent level.   Status On-going     CC Long Term Goal  #4   Title Pain/discomfort at right axilla area will be controlled at 6/10 or less.   Status On-going     CC Long Term Goal  #5   Title Patient will avoid infection by ongoing management of her lymphedema at right breast/axilla/upper arm areas.   Status On-going     CC Long Term Goal  #6   Title Pain in right groin/ischial tuberosity area will be controlled at 3/10 level or less to enable walking with less pain.   Status On-going            Plan - 08/01/16 1123    Clinical Impression Statement Has had a really busy week with medical appointments, as she started a new infusion (Keytruda) on Tuesday.  Has felt tired and some other side effects from that.  Right axilla swelling feels crampy or seizing today; leg hurts more. Right upper flank does feel fuller than normal today; right upper posterior thigh just medium tight on palpation, compared to normal. Continues to benefit from therapy.   Rehab Potential Good   Clinical Impairments Affecting Rehab Potential active cancer   PT Frequency 2x / week   PT Duration 12 weeks   PT Treatment/Interventions ADLs/Self Care Home Management;Therapeutic activities;Therapeutic exercise;Patient/family education;Manual techniques;Manual lymph drainage;Scar mobilization    PT Next Visit Plan manual lymph drainage, myofascial release, and soft tissue work.   Consulted and Agree with Plan of Care Patient      Patient will benefit from skilled therapeutic intervention in order to improve the following deficits and impairments:  Increased edema, Increased fascial restricitons, Pain  Visit Diagnosis: Pain in right thigh  Lymphedema, not elsewhere classified     Problem  List Patient Active Problem List   Diagnosis Date Noted  . Liver metastases ([place]) 02/23/2016  . 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 Zoster 04/04/2015  . Nausea with vomiting 11/18/2014  . Constipation 11/18/2014  . Left-sided thoracic back pain   . Bone metastases ([place]) 11/16/2014  . Back pain 11/15/2014  . Uncontrolled pain 11/14/2014  . Post-lymphadenectomy lymphedema of arm 05/31/2014  . Chest wall pain 03/21/2014  . Abnormal LFTs (liver function tests) 09/12/2013  . Malignant neoplasm of upper-inner quadrant of right breast in female, estrogen receptor positive ([place]) 08/18/2013  . 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

## 2016-08-04 ENCOUNTER — Other Ambulatory Visit: Payer: Self-pay

## 2016-08-04 ENCOUNTER — Ambulatory Visit: Payer: 59 | Admitting: Physical Therapy

## 2016-08-04 ENCOUNTER — Other Ambulatory Visit: Payer: 59

## 2016-08-04 DIAGNOSIS — M79651 Pain in right thigh: Secondary | ICD-10-CM | POA: Diagnosis not present

## 2016-08-04 DIAGNOSIS — I890 Lymphedema, not elsewhere classified: Secondary | ICD-10-CM

## 2016-08-04 NOTE — Therapy (Signed)
[place], [place], [address] Phone: [phone]   Fax:  [phone]  Physical Therapy Treatment  Patient Details  Name: [name] [name] MRN: [id number] Date of Birth: [birth year] Referring Provider: Dr. Kyung Rudd  Encounter Date: 08/04/2016      PT End of Session - 08/04/16 1306    Visit Number 44  31 for lymph   Number of Visits 50  for lymph   Date for PT Re-Evaluation 09/15/16   Authorization - Visit Number 99   Authorization - Number of Visits 140   PT Start Time 1019   PT Stop Time 1101   PT Time Calculation (min) 42 min   Activity Tolerance Patient tolerated treatment well   Behavior During Therapy [facility] for tasks assessed/performed      Past Medical History:  Diagnosis Date  . Bone metastases ([place]) dx'd 05/2014  . Breast cancer ([place]) dx'd 2005/2011  . Peripheral vascular disease ([place]) 02/2010   blood clot related to porta cath  . PONV (postoperative nausea and vomiting)   . S/P radiation therapy 07/17/2014 through 08/02/2014    Left mediastinum, left seventh rib 3250 cGy in 13 sessions   . S/P radiation therapy 12/11/2014 through 12/22/2014    Left parietal calvarium 2400 cGy in 8 sessions   . Seizures ([place]) 2010   Isolated incident.    Past Surgical History:  Procedure Laterality Date  . AXILLARY LYMPH NODE DISSECTION  Dec. 2011  . BREAST LUMPECTOMY  2005  . IR GENERIC HISTORICAL  05/21/2016   IR RADIOLOGIST EVAL & MGMT 05/21/2016 Sandi Mariscal, MD [facility] INTERV RAD  . MEDIASTINOTOMY CHAMBERLAIN MCNEIL Left 06/02/2013   Procedure: MEDIASTINOTOMY CHAMBERLAIN MCNEIL;  Surgeon: [name], MD;  Location: [facility];  Service: Thoracic;  Laterality: Left;  LEFT ANTERIOR MEDIASTINOTOMY   . PORTACATH PLACEMENT  12/11  . removal  portacath      There were no vitals filed for this visit.      Subjective Assessment - 08/04/16 1019    Subjective Got a bruise from getting the fulvestrant shot on Friday.  Will have a brain MRI on Thursday because of recent episodes of feeling off balance.   Currently in Pain? Yes   Pain Score 3   2-3   Pain Location Groin   Pain Orientation Right   Pain Descriptors / Indicators Tightness   Aggravating Factors  did some walking, not a lot, this weekend   Pain Relieving Factors myofascial release   Multiple Pain Sites Yes   Pain Score 6   Pain Location Axilla   Pain Orientation Right   Pain Descriptors / Indicators --  fullness   Pain Type Chronic pain   Aggravating Factors  more swelling   Pain Relieving Factors therapy                         [facility] Adult PT Treatment/Exercise - 08/04/16 0001      Manual Therapy   Myofascial Release right axilla with focus on scar tightness   Manual Lymphatic Drainage (MLD) In left sidelying, posterior interaxillary anastomosis and right axillo-inguinal anastomosis; in supine, short neck, left axilla and anterior interaxillary anastomosis, right groin and axillo-inguinal anastomosis, and right upper extremity from fingers to shoulder.  In right sidelying, left periscapular area toward left groin (with extra time spent on the latter today).   Other Manual Therapy soft tissue work at superior aspect of right medial  and upper thigh in prone position for pain relief and muscle relaxation, gentle myofascial release at upper thigh, with releases today from far medial to lateral aspects of posterior thigh                  PT Short Term Goals - 02/12/16 1226      PT SHORT TERM GOAL #1   Title pain with walking decreased >/= 25%   Time 4   Period Weeks   Status Achieved           PT Long Term Goals - 07/08/16 1324      PT LONG TERM GOAL #1   Title indpendent with HEP   Time 8   Period Weeks   Status On-going      PT LONG TERM GOAL #2   Title pain with walking decreased >/= 75%   Time 8   Period Weeks   Status On-going     PT LONG TERM GOAL #3   Title ability to flex her right hip with right groin pain decreased >/= 50% due to improved tissue mobility   Time 8   Period Weeks   Status Achieved     PT LONG TERM GOAL #4   Title waking up in middle of night with pain decreased >/=50%   Time 8   Period Weeks   Status Achieved     PT LONG TERM GOAL #5   Title reduction of pain by end of day >/= 50% due to increase tissue mobility   Time 8   Period Weeks   Status On-going           Long Term Clinic Goals - 08/04/16 1310      CC Long Term Goal  #2   Title Pt. will report swelling is adequately managed to enable ADL function at a consistent level.   Baseline We have continued to keep this from becoming more uncomfortable and she has had no infections.   Status On-going     CC Long Term Goal  #4   Title Pain/discomfort at right axilla area will be controlled at 6/10 or less.   Baseline She has maintained this level for some weeks now.   Status On-going     CC Long Term Goal  #5   Title Patient will avoid infection by ongoing management of her lymphedema at right breast/axilla/upper arm areas.   Baseline She has not had any infection.   Status On-going     CC Long Term Goal  #6   Title Pain in right groin/ischial tuberosity area will be controlled at 3/10 level or less to enable walking with less pain.   Baseline This pain usually stays in the 2-3 range, but at times increases to 4/10 if she has been on her feet and more active.   Status On-going            Plan - 08/04/16 1307    Clinical Impression Statement Pain complaints have been variable recently.  Left flank/back discomfort has flared a little, so requests more time on that area today.  Right upper outer breast and right upper thigh both feel more full and indurated today than last session. Continues to get relief  from discomfort with therapy treatments.   Rehab Potential Good   Clinical Impairments Affecting Rehab Potential active cancer   PT Frequency 2x / week   PT Duration 12 weeks   PT Treatment/Interventions ADLs/Self Care Home Management;Therapeutic activities;Therapeutic exercise;Patient/family education;Manual  techniques;Manual lymph drainage;Scar mobilization   PT Next Visit Plan manual lymph drainage, myofascial release, and soft tissue work.   Consulted and Agree with Plan of Care Patient      Patient will benefit from skilled therapeutic intervention in order to improve the following deficits and impairments:  Increased edema, Increased fascial restricitons, Pain  Visit Diagnosis: Pain in right thigh  Lymphedema, not elsewhere classified     Problem List Patient Active Problem List   Diagnosis Date Noted  . Liver metastases ([place]) 02/23/2016  . 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 Zoster 04/04/2015  . Nausea with vomiting 11/18/2014  . Constipation 11/18/2014  . Left-sided thoracic back pain   . Bone metastases ([place]) 11/16/2014  . Back pain 11/15/2014  . Uncontrolled pain 11/14/2014  . Post-lymphadenectomy lymphedema of arm 05/31/2014  . Chest wall pain 03/21/2014  . Abnormal LFTs (liver function tests) 09/12/2013  . Malignant neoplasm of upper-inner quadrant of right breast in female, estrogen receptor positive ([place]) 08/18/2013  . 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

## 2016-08-05 ENCOUNTER — Ambulatory Visit: Payer: 59 | Attending: Radiation Oncology | Admitting: Physical Therapy

## 2016-08-05 ENCOUNTER — Encounter: Payer: Self-pay | Admitting: Physical Therapy

## 2016-08-05 DIAGNOSIS — M62838 Other muscle spasm: Secondary | ICD-10-CM | POA: Diagnosis present

## 2016-08-05 DIAGNOSIS — M79651 Pain in right thigh: Secondary | ICD-10-CM

## 2016-08-05 DIAGNOSIS — M25551 Pain in right hip: Secondary | ICD-10-CM | POA: Insufficient documentation

## 2016-08-05 NOTE — Therapy (Signed)
[facility] [address], [place], [place], [address] Phone: [phone]   Fax:  [phone]  Physical Therapy Treatment  Patient Details  Name: [name] [name] MRN: [id number] Date of Birth: 10/02/1960 Referring Provider: Dr. Kyung Rudd  Encounter Date: 08/05/2016      PT End of Session - 08/05/16 1311    Visit Number 40   Date for PT Re-Evaluation 09/15/16   Authorization - Visit Number 61   Authorization - Number of Visits 140   PT Start Time 1237  came late   PT Stop Time 1315   PT Time Calculation (min) 38 min   Activity Tolerance Patient tolerated treatment well   Behavior During Therapy [facility] for tasks assessed/performed      Past Medical History:  Diagnosis Date  . Bone metastases ([place]) dx'd 05/2014  . Breast cancer ([place]) dx'd 2005/2011  . Peripheral vascular disease ([place]) 02/2010   blood clot related to porta cath  . PONV (postoperative nausea and vomiting)   . S/P radiation therapy 07/17/2014 through 08/02/2014    Left mediastinum, left seventh rib 3250 cGy in 13 sessions   . S/P radiation therapy 12/11/2014 through 12/22/2014    Left parietal calvarium 2400 cGy in 8 sessions   . Seizures ([place]) 2010   Isolated incident.    Past Surgical History:  Procedure Laterality Date  . AXILLARY LYMPH NODE DISSECTION  Dec. 2011  . BREAST LUMPECTOMY  2005  . IR GENERIC HISTORICAL  05/21/2016   IR RADIOLOGIST EVAL & MGMT 05/21/2016 Sandi Mariscal, MD [facility] INTERV RAD  . MEDIASTINOTOMY CHAMBERLAIN MCNEIL Left 06/02/2013   Procedure: MEDIASTINOTOMY CHAMBERLAIN MCNEIL;  Surgeon: [name], MD;  Location: [facility];  Service: Thoracic;  Laterality: Left;  LEFT ANTERIOR MEDIASTINOTOMY   . PORTACATH PLACEMENT  12/11  . removal portacath      There were no  vitals filed for this visit.      Subjective Assessment - 08/05/16 1241    Subjective The urine stream is not a straight flow. The cancer has grown in the liver. My next infusion is 5/22. The urine stream is not as bad since the beginning of treatment.    Limitations Writing   How long can you walk comfortably? more she walks patient will have increased pain that stops her from walking further   Diagnostic tests x rays of back show no fractures;    Patient Stated Goals walk 20 minutes with minimal limp   Currently in Pain? Yes   Pain Score 3    Pain Location Groin   Pain Orientation Right   Pain Descriptors / Indicators Tightness   Pain Type Chronic pain   Pain Onset More than a month ago   Pain Frequency Constant   Aggravating Factors  walking   Pain Relieving Factors myofascial release   Effect of Pain on Daily Activities walking   Multiple Pain Sites No                         [facility] Adult PT Treatment/Exercise - 08/05/16 0001      Manual Therapy   Manual Therapy Internal Pelvic Floor   Soft tissue mobilization right hamstring, hip adductors, and quads   Internal Pelvic Floor bil. urethra sphincter, bilateral  levator ani and obturator internist ,                   PT Short Term Goals -  02/12/16 1226      PT SHORT TERM GOAL #1   Title pain with walking decreased >/= 25%   Time 4   Period Weeks   Status Achieved           PT Long Term Goals - 08/05/16 1318      PT LONG TERM GOAL #1   Title indpendent with HEP   Time 8   Period Weeks   Status On-going     PT LONG TERM GOAL #2   Title pain with walking decreased >/= 75%   Time 8   Period Weeks   Status On-going     PT LONG TERM GOAL #3   Title ability to flex her right hip with right groin pain decreased >/= 50% due to improved tissue mobility   Time 8   Period Weeks   Status Achieved     PT LONG TERM GOAL #4   Title waking up in middle of night with pain decreased >/=50%    Time 8   Period Weeks   Status Achieved     PT LONG TERM GOAL #5   Title reduction of pain by end of day >/= 50% due to increase tissue mobility   Time 8   Period Weeks   Status On-going           Long Term Clinic Goals - 08/04/16 1310      CC Long Term Goal  #2   Title Pt. will report swelling is adequately managed to enable ADL function at a consistent level.   Baseline We have continued to keep this from becoming more uncomfortable and she has had no infections.   Status On-going     CC Long Term Goal  #4   Title Pain/discomfort at right axilla area will be controlled at 6/10 or less.   Baseline She has maintained this level for some weeks now.   Status On-going     CC Long Term Goal  #5   Title Patient will avoid infection by ongoing management of her lymphedema at right breast/axilla/upper arm areas.   Baseline She has not had any infection.   Status On-going     CC Long Term Goal  #6   Title Pain in right groin/ischial tuberosity area will be controlled at 3/10 level or less to enable walking with less pain.   Baseline This pain usually stays in the 2-3 range, but at times increases to 4/10 if she has been on her feet and more active.   Status On-going            Plan - 08/05/16 1314    Clinical Impression Statement Patient has 55% improvement in urine stream after therapy.  Patient had restrictions of the urethra spincter and right side of the introitus and puborectalis. Patient is having increased vaginal dryness due to the new medication she is taking.  Patietnt will benefit from skilled therapy to reduce pain and improve urine stream to improve quality of life.    Rehab Potential Good   PT Frequency 2x / week   PT Duration 12 weeks   PT Treatment/Interventions ADLs/Self Care Home Management;Therapeutic activities;Therapeutic exercise;Patient/family education;Manual techniques;Manual lymph drainage;Scar mobilization   PT Next Visit Plan soft tissue work to  decrease restrictions   PT Home Exercise Plan progress as needed   Consulted and Agree with Plan of Care Patient      Patient will benefit from skilled therapeutic intervention in order to improve the  following deficits and impairments:  Increased edema, Increased fascial restricitons, Pain  Visit Diagnosis: Pain in right thigh  Other muscle spasm  Pain in right hip     Problem List Patient Active Problem List   Diagnosis Date Noted  . Liver metastases ([place]) 02/23/2016  . 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 Zoster 04/04/2015  . Nausea with vomiting 11/18/2014  . Constipation 11/18/2014  . Left-sided thoracic back pain   . Bone metastases ([place]) 11/16/2014  . Back pain 11/15/2014  . Uncontrolled pain 11/14/2014  . Post-lymphadenectomy lymphedema of arm 05/31/2014  . Chest wall pain 03/21/2014  . Abnormal LFTs (liver function tests) 09/12/2013  . Malignant neoplasm of upper-inner quadrant of right breast in female, estrogen receptor positive ([place]) 08/18/2013  . 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 [place], [place], [address] Phone: [phone]   Fax:  [phone]  Name: [name] [name] MRN: [id number] Date of Birth: 03/04/1961

## 2016-08-07 ENCOUNTER — Ambulatory Visit (HOSPITAL_COMMUNITY)
Admission: RE | Admit: 2016-08-07 | Discharge: 2016-08-07 | Disposition: A | Discharge status: Home / Self Care | Payer: 59 | Source: Ambulatory Visit | Attending: Oncology | Admitting: Oncology

## 2016-08-07 ENCOUNTER — Other Ambulatory Visit: Payer: Self-pay

## 2016-08-07 ENCOUNTER — Encounter: Payer: Self-pay | Admitting: Oncology

## 2016-08-07 ENCOUNTER — Ambulatory Visit (HOSPITAL_BASED_OUTPATIENT_CLINIC_OR_DEPARTMENT_OTHER): Payer: 59

## 2016-08-07 ENCOUNTER — Ambulatory Visit: Payer: 59

## 2016-08-07 DIAGNOSIS — C787 Secondary malignant neoplasm of liver and intrahepatic bile duct: Secondary | ICD-10-CM

## 2016-08-07 DIAGNOSIS — C50211 Malignant neoplasm of upper-inner quadrant of right female breast: Secondary | ICD-10-CM | POA: Diagnosis not present

## 2016-08-07 DIAGNOSIS — Z170 Estrogen receptor positive status [ER+]: Secondary | ICD-10-CM | POA: Diagnosis not present

## 2016-08-07 DIAGNOSIS — C771 Secondary and unspecified malignant neoplasm of intrathoracic lymph nodes: Secondary | ICD-10-CM

## 2016-08-07 DIAGNOSIS — C7951 Secondary malignant neoplasm of bone: Secondary | ICD-10-CM | POA: Insufficient documentation

## 2016-08-07 LAB — CBC WITH DIFFERENTIAL/PLATELET
BASO%: 1.7 % (ref 0.0–2.0)
Basophils Absolute: 0.1 10*3/uL (ref 0.0–0.1)
EOS%: 2.1 % (ref 0.0–7.0)
Eosinophils Absolute: 0.1 10*3/uL (ref 0.0–0.5)
HCT: 33.8 % — ABNORMAL LOW (ref 34.8–46.6)
HGB: 12 g/dL (ref 11.6–15.9)
LYMPH%: 15.9 % (ref 14.0–49.7)
MCH: 37.6 pg — ABNORMAL HIGH (ref 25.1–34.0)
MCHC: 35.5 g/dL (ref 31.5–36.0)
MCV: 106 fL — ABNORMAL HIGH (ref 79.5–101.0)
MONO#: 0.5 10*3/uL (ref 0.1–0.9)
MONO%: 16.3 % — AB (ref 0.0–14.0)
NEUT%: 64 % (ref 38.4–76.8)
NEUTROS ABS: 1.9 10*3/uL (ref 1.5–6.5)
Platelets: 273 10*3/uL (ref 145–400)
RBC: 3.19 10*6/uL — AB (ref 3.70–5.45)
RDW: 13.3 % (ref 11.2–14.5)
WBC: 2.9 10*3/uL — AB (ref 3.9–10.3)
lymph#: 0.5 10*3/uL — ABNORMAL LOW (ref 0.9–3.3)

## 2016-08-07 MED ORDER — GADOBENATE DIMEGLUMINE 529 MG/ML IV SOLN
15.0000 mL | Freq: Once | INTRAVENOUS | Status: AC | PRN
  Administered 2016-08-07: 15 mL via INTRAVENOUS

## 2016-08-07 NOTE — Progress Notes (Signed)
Pt came in for peri IV access per need for MRI - noted pt not scheduled in treatment room per her usual protocol ( due to need for warm chair to assist with IV start ).   Pt very upset per above with multiple statements of concern due " you guys are not doing this today " - this RN listened to pt's concerns with empathy and apologized per her concerns with scheduling today.  This RN obtained a chair in the treatment room and offered to start her IV- Lisel declined this RN to start her IV and requested for another RN by name.  This RN was able to arrange for requested RN for IV access.  Note pt had to be stuck 6 times for to obtain IV access by 3 different RNs.  IV was placed with lab draw and pt left for MRI of her brain as scheduled.

## 2016-08-08 ENCOUNTER — Ambulatory Visit: Payer: 59

## 2016-08-08 ENCOUNTER — Other Ambulatory Visit: Payer: 59

## 2016-08-08 ENCOUNTER — Other Ambulatory Visit: Payer: Self-pay

## 2016-08-08 ENCOUNTER — Other Ambulatory Visit: Payer: Self-pay | Admitting: Oncology

## 2016-08-08 DIAGNOSIS — M79651 Pain in right thigh: Secondary | ICD-10-CM | POA: Diagnosis not present

## 2016-08-08 DIAGNOSIS — I890 Lymphedema, not elsewhere classified: Secondary | ICD-10-CM

## 2016-08-08 DIAGNOSIS — C50211 Malignant neoplasm of upper-inner quadrant of right female breast: Secondary | ICD-10-CM

## 2016-08-08 DIAGNOSIS — R2689 Other abnormalities of gait and mobility: Secondary | ICD-10-CM

## 2016-08-08 DIAGNOSIS — Z170 Estrogen receptor positive status [ER+]: Principal | ICD-10-CM

## 2016-08-08 DIAGNOSIS — M62838 Other muscle spasm: Secondary | ICD-10-CM

## 2016-08-08 DIAGNOSIS — M25551 Pain in right hip: Secondary | ICD-10-CM

## 2016-08-08 NOTE — Therapy (Signed)
[place], [place], [address] Phone: [phone]   Fax:  [phone]  Physical Therapy Treatment  Patient Details  Name: [name] [name] MRN: [id number] Date of Birth: 08-May-1960 Referring Provider: Dr. Kyung Rudd  Encounter Date: 08/08/2016      PT End of Session - 08/08/16 1019    Visit Number 48  38 for lymph   Number of Visits 41  For lymph   PT Start Time 0932   PT Stop Time 1017   PT Time Calculation (min) 45 min   Activity Tolerance Patient tolerated treatment well   Behavior During Therapy [facility] for tasks assessed/performed      Past Medical History:  Diagnosis Date  . Bone metastases ([place]) dx'd 05/2014  . Breast cancer ([place]) dx'd 2005/2011  . Peripheral vascular disease ([place]) 02/2010   blood clot related to porta cath  . PONV (postoperative nausea and vomiting)   . S/P radiation therapy 07/17/2014 through 08/02/2014    Left mediastinum, left seventh rib 3250 cGy in 13 sessions   . S/P radiation therapy 12/11/2014 through 12/22/2014    Left parietal calvarium 2400 cGy in 8 sessions   . Seizures ([place]) 2010   Isolated incident.    Past Surgical History:  Procedure Laterality Date  . AXILLARY LYMPH NODE DISSECTION  Dec. 2011  . BREAST LUMPECTOMY  2005  . IR GENERIC HISTORICAL  05/21/2016   IR RADIOLOGIST EVAL & MGMT 05/21/2016 Sandi Mariscal, MD [facility] INTERV RAD  . MEDIASTINOTOMY CHAMBERLAIN MCNEIL Left 06/02/2013   Procedure: MEDIASTINOTOMY CHAMBERLAIN MCNEIL;  Surgeon: [name], MD;  Location: [facility];  Service: Thoracic;  Laterality: Left;  LEFT ANTERIOR MEDIASTINOTOMY   . PORTACATH PLACEMENT  12/11  . removal portacath      There were no vitals filed for this visit.      Subjective Assessment - 08/08/16 0935    Subjective I got stuck 4 times yesterday when they were trying to draw blood in my Lt arm so I was hoping we could do some manual lymph drainage there as well becasue I need to have more blood drawn today.    How long can you walk comfortably? more she walks patient will have increased pain that stops her from walking further   Diagnostic tests x rays of back show no fractures;    Patient Stated Goals walk 20 minutes with minimal limp   Currently in Pain? Yes   Pain Score 2    Pain Location Groin   Pain Orientation Right   Pain Descriptors / Indicators Tightness   Pain Type Chronic pain   Pain Onset More than a month ago   Pain Frequency Constant   Aggravating Factors  walking    Pain Relieving Factors myofascial release   Multiple Pain Sites Yes   Pain Score 6   Pain Location Axilla   Pain Orientation Right   Pain Descriptors / Indicators Other (Comment)  fullness   Pain Type Chronic pain   Pain Radiating Towards upper arm   Pain Onset More than a month ago   Pain Frequency Constant   Aggravating Factors  increased swelling   Pain Relieving Factors therapy                         [facility] Adult PT Treatment/Exercise - 08/08/16 0001      Manual Therapy   Manual Lymphatic Drainage (MLD) In left sidelying, posterior interaxillary  anastomosis and right axillo-inguinal anastomosis; in supine, short neck, left axilla and anterior interaxillary anastomosis, right groin and axillo-inguinal anastomosis, and right upper extremity from fingers to shoulder, then same for Lt UE per pt request due to incresaed swelling from recent blood draws.  In right sidelying, left periscapular area toward left groin (with extra time spent on the latter today).   Other Manual Therapy soft tissue work at superior aspect of right medial and upper thigh in prone position for pain relief and muscle relaxation, gentle myofascial release at upper thigh, with releases today from far medial to lateral  aspects of posterior thigh; then supine for Rt inner thigh/groin with entle P/ROM stretching briefly                  PT Short Term Goals - 02/12/16 1226      PT SHORT TERM GOAL #1   Title pain with walking decreased >/= 25%   Time 4   Period Weeks   Status Achieved           PT Long Term Goals - 08/05/16 1318      PT LONG TERM GOAL #1   Title indpendent with HEP   Time 8   Period Weeks   Status On-going     PT LONG TERM GOAL #2   Title pain with walking decreased >/= 75%   Time 8   Period Weeks   Status On-going     PT LONG TERM GOAL #3   Title ability to flex her right hip with right groin pain decreased >/= 50% due to improved tissue mobility   Time 8   Period Weeks   Status Achieved     PT LONG TERM GOAL #4   Title waking up in middle of night with pain decreased >/=50%   Time 8   Period Weeks   Status Achieved     PT LONG TERM GOAL #5   Title reduction of pain by end of day >/= 50% due to increase tissue mobility   Time 8   Period Weeks   Status On-going           Long Term Clinic Goals - 08/04/16 1310      CC Long Term Goal  #2   Title Pt. will report swelling is adequately managed to enable ADL function at a consistent level.   Baseline We have continued to keep this from becoming more uncomfortable and she has had no infections.   Status On-going     CC Long Term Goal  #4   Title Pain/discomfort at right axilla area will be controlled at 6/10 or less.   Baseline She has maintained this level for some weeks now.   Status On-going     CC Long Term Goal  #5   Title Patient will avoid infection by ongoing management of her lymphedema at right breast/axilla/upper arm areas.   Baseline She has not had any infection.   Status On-going     CC Long Term Goal  #6   Title Pain in right groin/ischial tuberosity area will be controlled at 3/10 level or less to enable walking with less pain.   Baseline This pain usually stays in the 2-3  range, but at times increases to 4/10 if she has been on her feet and more active.   Status On-going            Plan - 08/08/16 1023    Clinical Impression Statement Pain  still continues to be variable due to pts recent treamtnents but continues to report benefits of treatment to allow her to have less pain with walking and allows her to increase her distance with walking for a few days.  Pt noticed some increased swelling in her Lt hand from being stuck by IV 4 times yesterday so focused briefly on MLD here which improved by end of session.    Rehab Potential Good   Clinical Impairments Affecting Rehab Potential active cancer   PT Frequency 2x / week   PT Duration 12 weeks   PT Treatment/Interventions ADLs/Self Care Home Management;Therapeutic activities;Therapeutic exercise;Patient/family education;Manual techniques;Manual lymph drainage;Scar mobilization   PT Next Visit Plan manual lymph drainage, soft tissue, and myofascial release   Consulted and Agree with Plan of Care Patient      Patient will benefit from skilled therapeutic intervention in order to improve the following deficits and impairments:  Increased edema, Increased fascial restricitons, Pain  Visit Diagnosis: Pain in right thigh  Other muscle spasm  Pain in right hip  Lymphedema, not elsewhere classified  Other abnormalities of gait and mobility     Problem List Patient Active Problem List   Diagnosis Date Noted  . Liver metastases ([place]) 02/23/2016  . 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 Zoster 04/04/2015  . Nausea with vomiting 11/18/2014  . Constipation 11/18/2014  . Left-sided thoracic back pain   . Bone metastases ([place]) 11/16/2014  . Back pain 11/15/2014  . Uncontrolled pain 11/14/2014  . Post-lymphadenectomy lymphedema of arm 05/31/2014  . Chest wall pain 03/21/2014  . Abnormal LFTs (liver function tests) 09/12/2013  . Malignant neoplasm of upper-inner quadrant of right breast in  female, estrogen receptor positive ([place]) 08/18/2013  . 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

## 2016-08-08 NOTE — Progress Notes (Unsigned)
These had dropped in today because she was concerned about possible extravasation injury but her left wrist is fine.  I gave her a copy of her brain MRI which shows no evidence of metastatic disease to the brain.  We clarified her lab work. She is going to have a CBC only today did she will have all her lab work on May 21 and then her every 2 to second dose May 22.  We will be checking her TSH on an every three-week basis.  She is having a little bit of more pain in the left rib cage. She took an Aleve and it caused significant esophagitis. I suggested she try Tylenol first and if that does not work she can try the Aleve next time with a big glass of water.  Otherwise she already sees me June 1. She knows to call for any problems that may develop before her next visit.

## 2016-08-11 ENCOUNTER — Other Ambulatory Visit: Payer: 59

## 2016-08-12 ENCOUNTER — Encounter: Payer: 59 | Admitting: Physical Therapy

## 2016-08-12 ENCOUNTER — Telehealth: Payer: Self-pay | Admitting: *Deleted

## 2016-08-12 ENCOUNTER — Ambulatory Visit: Payer: 59 | Admitting: Physical Therapy

## 2016-08-12 DIAGNOSIS — I890 Lymphedema, not elsewhere classified: Secondary | ICD-10-CM

## 2016-08-12 DIAGNOSIS — M79651 Pain in right thigh: Secondary | ICD-10-CM | POA: Diagnosis not present

## 2016-08-12 NOTE — Therapy (Addendum)
[place], [place], [address] Phone: [phone]   Fax:  [phone]  Physical Therapy Treatment  Patient Details  Name: [name] [name] MRN: [id number] Date of Birth: Feb 09, 1961 Referring Provider: Dr. Kyung Rudd  Encounter Date: 08/12/2016      PT End of Session - 08/12/16 1414    Visit Number 48  39 for lymph   Number of Visits 29  for lymph   Date for PT Re-Evaluation 09/15/16   Authorization - Visit Number 47   Authorization - Number of Visits 140   PT Start Time 1206   PT Stop Time 1247   PT Time Calculation (min) 41 min   Activity Tolerance Patient tolerated treatment well   Behavior During Therapy [facility] for tasks assessed/performed      Past Medical History:  Diagnosis Date  . Bone metastases ([place]) dx'd 05/2014  . Breast cancer ([place]) dx'd 2005/2011  . Peripheral vascular disease ([place]) 02/2010   blood clot related to porta cath  . PONV (postoperative nausea and vomiting)   . S/P radiation therapy 07/17/2014 through 08/02/2014    Left mediastinum, left seventh rib 3250 cGy in 13 sessions   . S/P radiation therapy 12/11/2014 through 12/22/2014    Left parietal calvarium 2400 cGy in 8 sessions   . Seizures ([place]) 2010   Isolated incident.    Past Surgical History:  Procedure Laterality Date  . AXILLARY LYMPH NODE DISSECTION  Dec. 2011  . BREAST LUMPECTOMY  2005  . IR GENERIC HISTORICAL  05/21/2016   IR RADIOLOGIST EVAL & MGMT 05/21/2016 Sandi Mariscal, MD [facility] INTERV RAD  . MEDIASTINOTOMY CHAMBERLAIN MCNEIL Left 06/02/2013   Procedure: MEDIASTINOTOMY CHAMBERLAIN MCNEIL;  Surgeon: [name], MD;  Location: [facility];  Service: Thoracic;  Laterality: Left;  LEFT ANTERIOR MEDIASTINOTOMY   . PORTACATH PLACEMENT  12/11  . removal  portacath      There were no vitals filed for this visit.      Subjective Assessment - 08/12/16 1411    Subjective "I wanted to tell you in person that I still have a brain."  Says her MRI showed that skull lesions have reduced in size and there were no brain lesions. Did benefit from extra time spent on manual lymph drainage to left back last time.   Currently in Pain? Yes   Pain Score 3   2-3   Pain Location Groin   Pain Orientation Right   Pain Descriptors / Indicators Tightness   Pain Type Chronic pain   Aggravating Factors  walking   Pain Relieving Factors myofascial release   Multiple Pain Sites Yes   Pain Score 6   Pain Location Axilla   Pain Orientation Right   Pain Descriptors / Indicators Other (Comment)  fullness   Pain Type Chronic pain   Pain Frequency Constant   Aggravating Factors  increased swelling   Pain Relieving Factors therapy                                   PT Short Term Goals - 02/12/16 1226      PT SHORT TERM GOAL #1   Title pain with walking decreased >/= 25%   Time 4   Period Weeks   Status Achieved           PT Long Term Goals - 08/05/16 1318      PT  LONG TERM GOAL #1   Title indpendent with HEP   Time 8   Period Weeks   Status On-going     PT LONG TERM GOAL #2   Title pain with walking decreased >/= 75%   Time 8   Period Weeks   Status On-going     PT LONG TERM GOAL #3   Title ability to flex her right hip with right groin pain decreased >/= 50% due to improved tissue mobility   Time 8   Period Weeks   Status Achieved     PT LONG TERM GOAL #4   Title waking up in middle of night with pain decreased >/=50%   Time 8   Period Weeks   Status Achieved     PT LONG TERM GOAL #5   Title reduction of pain by end of day >/= 50% due to increase tissue mobility   Time 8   Period Weeks   Status On-going           Long Term Clinic Goals - 08/04/16 1310      CC Long Term Goal  #2   Title Pt.  will report swelling is adequately managed to enable ADL function at a consistent level.   Baseline We have continued to keep this from becoming more uncomfortable and she has had no infections.   Status On-going     CC Long Term Goal  #4   Title Pain/discomfort at right axilla area will be controlled at 6/10 or less.   Baseline She has maintained this level for some weeks now.   Status On-going     CC Long Term Goal  #5   Title Patient will avoid infection by ongoing management of her lymphedema at right breast/axilla/upper arm areas.   Baseline She has not had any infection.   Status On-going     CC Long Term Goal  #6   Title Pain in right groin/ischial tuberosity area will be controlled at 3/10 level or less to enable walking with less pain.   Baseline This pain usually stays in the 2-3 range, but at times increases to 4/10 if she has been on her feet and more active.   Status On-going            Plan - 08/12/16 1415    Clinical Impression Statement Still benefitting from therapy to improve comfort and function.   Rehab Potential Good   PT Frequency 2x / week   PT Duration 12 weeks   PT Treatment/Interventions ADLs/Self Care Home Management;Therapeutic activities;Therapeutic exercise;Patient/family education;Manual techniques;Manual lymph drainage;Scar mobilization   PT Next Visit Plan manual lymph drainage, soft tissue, and myofascial release   Consulted and Agree with Plan of Care Patient      Patient will benefit from skilled therapeutic intervention in order to improve the following deficits and impairments:  Increased edema, Increased fascial restricitons, Pain  Visit Diagnosis: Pain in right thigh  Lymphedema, not elsewhere classified     Problem List Patient Active Problem List   Diagnosis Date Noted  . Liver metastases ([place]) 02/23/2016  . 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 Zoster 04/04/2015  . Nausea with vomiting 11/18/2014  . Constipation  11/18/2014  . Left-sided thoracic back pain   . Bone metastases ([place]) 11/16/2014  . Back pain 11/15/2014  . Uncontrolled pain 11/14/2014  . Post-lymphadenectomy lymphedema of arm 05/31/2014  . Chest wall pain 03/21/2014  . Abnormal LFTs (liver function tests) 09/12/2013  . Malignant neoplasm  of upper-inner quadrant of right breast in female, estrogen receptor positive ([place]) 08/18/2013  . 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

## 2016-08-14 ENCOUNTER — Telehealth: Payer: Self-pay | Admitting: *Deleted

## 2016-08-14 NOTE — Telephone Encounter (Signed)
This RN spoke with pt per call wanting to " let Dr Jana Hakim know of some physical changes "  [name] [name] states she has had increased reflux with a " bad taste in my mouth "since starting new regimen - and has started back on aciphex.  She continues to have intermittent pain in her left rib cage - not getting worse - just present.  She has developed a new pain yesterday in her lower left abdomen " that came in spasms - like a squeezing that was very- very intense "  Spasms lasted for a few minutes then subsided.  She has notable changes in her bowels in color and consistency post chemo- ranging from soft yellow to darker - " more constipated ".  Overall - [name] [name] is managing symptoms.  [name] [name] is presently on her off week of the [place] with scheduled lab on 08/18/2016.  Plan per above - is to inform MD of status. [name] [name] has no active needs at this time.

## 2016-08-15 ENCOUNTER — Other Ambulatory Visit: Payer: 59

## 2016-08-15 ENCOUNTER — Emergency Department (HOSPITAL_COMMUNITY)
Admission: EM | Admit: 2016-08-15 | Discharge: 2016-08-15 | Disposition: A | Discharge status: Home / Self Care | Payer: 59 | Attending: Emergency Medicine | Admitting: Emergency Medicine

## 2016-08-15 ENCOUNTER — Encounter (HOSPITAL_COMMUNITY): Payer: Self-pay

## 2016-08-15 ENCOUNTER — Emergency Department (HOSPITAL_COMMUNITY): Payer: 59

## 2016-08-15 ENCOUNTER — Ambulatory Visit: Payer: 59 | Admitting: Physical Therapy

## 2016-08-15 ENCOUNTER — Other Ambulatory Visit: Payer: Self-pay | Admitting: Oncology

## 2016-08-15 DIAGNOSIS — K2100 Gastro-esophageal reflux disease with esophagitis, without bleeding: Secondary | ICD-10-CM

## 2016-08-15 DIAGNOSIS — Z853 Personal history of malignant neoplasm of breast: Secondary | ICD-10-CM | POA: Insufficient documentation

## 2016-08-15 DIAGNOSIS — R1011 Right upper quadrant pain: Secondary | ICD-10-CM | POA: Insufficient documentation

## 2016-08-15 DIAGNOSIS — R0789 Other chest pain: Secondary | ICD-10-CM

## 2016-08-15 DIAGNOSIS — Z79899 Other long term (current) drug therapy: Secondary | ICD-10-CM | POA: Insufficient documentation

## 2016-08-15 DIAGNOSIS — R079 Chest pain, unspecified: Secondary | ICD-10-CM | POA: Diagnosis present

## 2016-08-15 DIAGNOSIS — K219 Gastro-esophageal reflux disease without esophagitis: Secondary | ICD-10-CM | POA: Diagnosis not present

## 2016-08-15 LAB — COMPREHENSIVE METABOLIC PANEL
ALBUMIN: 3.9 g/dL (ref 3.5–5.0)
ALT: 792 U/L — ABNORMAL HIGH (ref 14–54)
AST: 706 U/L — AB (ref 15–41)
Alkaline Phosphatase: 301 U/L — ABNORMAL HIGH (ref 38–126)
Anion gap: 12 (ref 5–15)
BUN: 7 mg/dL (ref 6–20)
CALCIUM: 9.2 mg/dL (ref 8.9–10.3)
CO2: 22 mmol/L (ref 22–32)
Chloride: 97 mmol/L — ABNORMAL LOW (ref 101–111)
Creatinine, Ser: 0.67 mg/dL (ref 0.44–1.00)
GFR calc Af Amer: >60 mL/min (ref >60)
GFR calc non Af Amer: >60 mL/min (ref >60)
GLUCOSE: 124 mg/dL — AB (ref 65–99)
Potassium: 4.2 mmol/L (ref 3.5–5.1)
SODIUM: 131 mmol/L — AB (ref 135–145)
TOTAL PROTEIN: 7.1 g/dL (ref 6.5–8.1)
Total Bilirubin: 1.9 mg/dL — ABNORMAL HIGH (ref 0.3–1.2)

## 2016-08-15 LAB — URINALYSIS, ROUTINE W REFLEX MICROSCOPIC
BILIRUBIN URINE: NEGATIVE
GLUCOSE, UA: NEGATIVE mg/dL
HGB URINE DIPSTICK: NEGATIVE
KETONES UR: NEGATIVE mg/dL
Leukocytes, UA: NEGATIVE
NITRITE: NEGATIVE
PH: 6 (ref 5.0–8.0)
Protein, ur: NEGATIVE mg/dL
Specific Gravity, Urine: 1.009 (ref 1.005–1.030)

## 2016-08-15 LAB — CBC WITH DIFFERENTIAL/PLATELET
BASOS PCT: 1 %
Basophils Absolute: 0 10*3/uL (ref 0.0–0.1)
Eosinophils Absolute: 0 10*3/uL (ref 0.0–0.7)
Eosinophils Relative: 0 %
HEMATOCRIT: 33.2 % — AB (ref 36.0–46.0)
HEMOGLOBIN: 11.9 g/dL — AB (ref 12.0–15.0)
LYMPHS ABS: 0.7 10*3/uL (ref 0.7–4.0)
Lymphocytes Relative: 17 %
MCH: 37.4 pg — ABNORMAL HIGH (ref 26.0–34.0)
MCHC: 35.8 g/dL (ref 30.0–36.0)
MCV: 104.4 fL — ABNORMAL HIGH (ref 78.0–100.0)
MONO ABS: 0.5 10*3/uL (ref 0.1–1.0)
MONOS PCT: 14 %
NEUTROS ABS: 2.6 10*3/uL (ref 1.7–7.7)
Neutrophils Relative %: 68 %
Platelets: 265 10*3/uL (ref 150–400)
RBC: 3.18 MIL/uL — ABNORMAL LOW (ref 3.87–5.11)
RDW: 13 % (ref 11.5–15.5)
WBC: 3.8 10*3/uL — ABNORMAL LOW (ref 4.0–10.5)

## 2016-08-15 LAB — LIPASE, BLOOD: Lipase: 31 U/L (ref 11–51)

## 2016-08-15 LAB — I-STAT TROPONIN, ED: Troponin i, poc: 0.03 ng/mL (ref 0.00–0.08)

## 2016-08-15 MED ORDER — ONDANSETRON 8 MG PO TBDP
8.0000 mg | ORAL_TABLET | Freq: Once | ORAL | Status: AC
  Administered 2016-08-15: 8 mg via ORAL
  Filled 2016-08-15: qty 1

## 2016-08-15 MED ORDER — SODIUM CHLORIDE 0.9 % IV BOLUS (SEPSIS)
1000.0000 mL | Freq: Once | INTRAVENOUS | Status: AC
  Administered 2016-08-15: 1000 mL via INTRAVENOUS

## 2016-08-15 MED ORDER — GI COCKTAIL ~~LOC~~
30.0000 mL | Freq: Once | ORAL | Status: AC
  Administered 2016-08-15: 30 mL via ORAL
  Filled 2016-08-15: qty 30

## 2016-08-15 MED ORDER — FAMOTIDINE 20 MG PO TABS
40.0000 mg | ORAL_TABLET | Freq: Once | ORAL | Status: AC
  Administered 2016-08-15: 40 mg via ORAL
  Filled 2016-08-15: qty 2

## 2016-08-15 MED ORDER — GI COCKTAIL ~~LOC~~: 30.0000 mL | Freq: Three times a day (TID) | ORAL | 0 refills | Status: DC | PRN

## 2016-08-15 MED ORDER — FAMOTIDINE 40 MG PO TABS: 40.0000 mg | ORAL_TABLET | Freq: Every day | ORAL | 0 refills | Status: DC

## 2016-08-15 NOTE — Discharge Instructions (Signed)
1. [place]. 2. START TAKING ACIPHEX DAILY. (this is the same category as prilosec, prevacid, and nexium) 3. YOU MAY TAKE PEPCID AS NEEDED FOR HEARTBURN SYMPTOMS. (this is the same category as zantac) 4. YOU MAY USE GI COCKTAIL AS NEEDED FOR HEARTBURN SYMPTOMS. 5. RETURN TO ED IF YOU DEVELOP ABDOMINAL PAIN, INTRACTABLE VOMITING, BLOODY VOMIT, BLOODY STOOL, FEVER, OR BREATHING PROBLEMS.

## 2016-08-15 NOTE — ED Notes (Signed)
Patient stated she vomited prior to writer coming into the room. Patient then stated nausea has decreased and headache lessened.

## 2016-08-15 NOTE — ED Provider Notes (Signed)
[facility] DEPT Provider Note   CSN: [id number] Arrival date & time: 08/15/16  0751     History   Chief Complaint Chief Complaint  Patient presents with  . Chest Pain    HPI [name] [name] is a 56 y.o. female.  56yo F w/ PMH including metastatic breast cancer s/p radiation and on chemotherapy who presents with chest pain. Pt reports intermittent problems with "acid" and reflux related to damage of her esophagus from previous radiation therapy. Yesterday morning, she felt symptoms of acid reflux and took aciphex which seemed to improve her symptoms.. She began having central chest pressure radiating to top of her back that woke her from sleep around midnight last night. She took tums, later began vomiting around 2am, had 3 total episodes. She reports feeling better after vomiting. She took another dose of aciphex at 6am today. She reports ongoing bad taste in mouth and feeling like acid is refluxing up her throat. No change in bowel movements, SOB, or urinary symptoms. She reports mild cough that is chronic but no changes recently, no fevers. She states she has occasionally had the same type of chest pain radiating to back which always seems to be related to foods or medications she takes as she says she is very sensitive to certain medications. The pain last night was the same but more severe. She also reports being "very dehydrated" because she has only had sips of fluid this morning.   The history is provided by the patient.  Chest Pain      Past Medical History:  Diagnosis Date  . Bone metastases ([place]) dx'd 05/2014  . Breast cancer ([place]) dx'd 2005/2011  . Peripheral vascular disease ([place]) 02/2010   blood clot related to porta cath  . PONV (postoperative nausea and vomiting)   . S/P radiation therapy 07/17/2014 through 08/02/2014    Left mediastinum, left seventh rib 3250 cGy in 13 sessions   . S/P  radiation therapy 12/11/2014 through 12/22/2014    Left parietal calvarium 2400 cGy in 8 sessions   . Seizures ([place]) 2010   Isolated incident.    Patient Active Problem List   Diagnosis Date Noted  . Liver metastases ([place]) 02/23/2016  . 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 Zoster 04/04/2015  . Nausea with vomiting 11/18/2014  . Constipation 11/18/2014  . Left-sided thoracic back pain   . Bone metastases ([place]) 11/16/2014  . Back pain 11/15/2014  . Uncontrolled pain 11/14/2014  . 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 Chest wall pain 03/21/2014  . Abnormal LFTs (liver function tests) 09/12/2013  . Malignant neoplasm of upper-inner quadrant of right breast in female, estrogen receptor positive ([place]) 08/18/2013  . 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 AXILLARY LYMPH NODE DISSECTION  Dec. 2011  . BREAST LUMPECTOMY  2005  . IR GENERIC HISTORICAL  05/21/2016   IR RADIOLOGIST EVAL & MGMT 05/21/2016 Sandi Mariscal, MD [facility] INTERV RAD  . MEDIASTINOTOMY CHAMBERLAIN MCNEIL Left 06/02/2013   Procedure: MEDIASTINOTOMY CHAMBERLAIN MCNEIL;  Surgeon: [name], MD;  Location: [facility];  Service: Thoracic;  Laterality: Left;  LEFT ANTERIOR MEDIASTINOTOMY   . PORTACATH PLACEMENT  12/11  . removal portacath      OB History    No data available       Home Medications    Prior to Admission medications   Medication Sig Start Date End Date Taking? Authorizing  Provider  ALPRAZolam Duanne Moron) 0.5 MG tablet Take 1 tablet (0.5 mg total) by mouth 2 (two) times daily as needed for anxiety. Patient taking differently: Take 0.25-0.5 mg by mouth 2 (two) times daily as needed for anxiety.  03/03/16  Yes Magrinat, Virgie Dad, MD  B Complex-C (B-COMPLEX WITH VITAMIN C) tablet Take 1 tablet by mouth daily. Reported on  03/27/2015 01/19/15  Yes Magrinat, Virgie Dad, MD  calcium carbonate (TUMS - DOSED IN MG ELEMENTAL CALCIUM) 500 MG chewable tablet Chew 1 tablet by mouth as directed.   Yes [provider]  cholecalciferol 2000 UNITS tablet Take 1 tablet (2,000 Units total) by mouth daily. 01/19/15  Yes Magrinat, Virgie Dad, MD  folic acid (FOLVITE) 1 MG tablet Take 1 tablet (1 mg total) by mouth daily. 01/19/15  Yes Magrinat, Virgie Dad, MD  letrozole [facility]) 2.5 MG tablet Take 2.5 mg by mouth at bedtime. 07/22/16  Yes [provider]  loratadine-pseudoephedrine (CLARITIN-D 24-HOUR) 10-240 MG 24 hr tablet Take 1 tablet by mouth daily.   Yes [provider]  Melatonin 3 MG TABS Take 3 mg by mouth at bedtime.   Yes [provider]  palbociclib (IBRANCE) 75 MG capsule Take 1 capsule (75 mg total) by mouth every other day. Take whole with food. 07/25/16  Yes Magrinat, Virgie Dad, MD  RABEprazole Sodium 5 MG CPSP Take 5 mg by mouth daily. 10/08/15  Yes Magrinat, Virgie Dad, MD  saccharomyces boulardii (FLORASTOR) 250 MG capsule Take 250 mg by mouth daily.  03/23/15  Yes Magrinat, Virgie Dad, MD  Alum & Mag Hydroxide-Simeth (GI COCKTAIL) SUSP suspension Take 30 mLs by mouth 3 (three) times daily as needed for indigestion. Shake well. 08/15/16   Little, Wenda Overland, MD  Diphenhyd-Hydrocort-Nystatin (FIRST-DUKES MOUTHWASH) SUSP 5-10 ml qid SWISH AND SPIT Patient not taking: Reported on 08/15/2016 05/09/15   Magrinat, Virgie Dad, MD  famotidine (PEPCID) 40 MG tablet Take 1 tablet (40 mg total) by mouth daily. 08/15/16   Little, Wenda Overland, MD  phenazopyridine (PYRIDIUM) 100 MG tablet Take 1 tablet (100 mg total) by mouth 3 (three) times daily as needed for pain. Patient not taking: Reported on 08/15/2016 05/16/16   Magrinat, Virgie Dad, MD  vitamin E (VITAMIN E) 400 UNIT capsule Take 1 capsule (400 Units total) by mouth 2 (two) times daily. Patient not taking: Reported on 08/15/2016 05/16/16   Kyung Rudd, MD      Family History Family History  Problem Relation Age of Onset  . COPD Mother   . Breast cancer Sister 1    Social History Social History  Substance Use Topics  . Smoking status: Never Smoker  . Smokeless tobacco: Never Used  . Alcohol use No     Allergies   2nd skin quick heal; Decadron [dexamethasone]; Dilaudid [hydromorphone]; Enoxaparin; Fluconazole; Hydromorphone hcl; Morphine and related; Protonix [pantoprazole sodium]; and Tegaderm ag mesh [silver]   Review of Systems Review of Systems  Cardiovascular: Positive for chest pain.  All other systems reviewed and are negative except that which was mentioned in HPI   Physical Exam Updated Vital Signs BP (!) 160/98   Pulse (!) 107   Temp 97.7 F (36.5 C) (Oral)   Resp 18   Ht 5\' 4"  (1.626 m)   Wt 160 lb (72.6 kg)   SpO2 99%   BMI 27.46 kg/m   Physical Exam  Constitutional: She is oriented to person, place, and time. She appears well-developed and well-nourished. No distress.  HENT:  Head: Normocephalic and atraumatic.  Moist mucous membranes  Eyes: Conjunctivae are normal. Pupils are equal, round, and reactive to light.  Neck: Neck supple.  Cardiovascular: Normal rate, regular rhythm and normal heart sounds.   No murmur heard. Pulmonary/Chest: Effort normal and breath sounds normal.  Abdominal: Soft. Bowel sounds are normal. She exhibits no distension. There is tenderness. There is no rebound and no guarding.  Mild RUQ tenderness  Musculoskeletal: She exhibits no edema.  Neurological: She is alert and oriented to person, place, and time.  Fluent speech  Skin: Skin is warm and dry.  Psychiatric: She has a normal mood and affect. Judgment normal.  Nursing note and vitals reviewed.    ED Treatments / Results  Labs (all labs ordered are listed, but only abnormal results are displayed) Labs Reviewed  COMPREHENSIVE METABOLIC PANEL - Abnormal; Notable for the following:       Result Value   Sodium 131  (*)    Chloride 97 (*)    Glucose, Bld 124 (*)    AST 706 (*)    ALT 792 (*)    Alkaline Phosphatase 301 (*)    Total Bilirubin 1.9 (*)    All other components within normal limits  CBC WITH DIFFERENTIAL/PLATELET - Abnormal; Notable for the following:    WBC 3.8 (*)    RBC 3.18 (*)    Hemoglobin 11.9 (*)    HCT 33.2 (*)    MCV 104.4 (*)    MCH 37.4 (*)    All other components within normal limits  LIPASE, BLOOD  URINALYSIS, ROUTINE W REFLEX MICROSCOPIC  I-STAT TROPOININ, ED    EKG  EKG Interpretation  Date/Time:  Friday Aug 15 2016 08:04:17 EDT Ventricular Rate:  99 PR Interval:    QRS Duration: 105 QT Interval:  354 QTC Calculation: 455 R Axis:   -35 Text Interpretation:  Sinus rhythm Left axis deviation Low voltage, precordial leads Abnormal R-wave progression, early transition Baseline wander in lead(s) V6 poor R wave progression through precordial leads compared to previous Confirmed by Theotis Burrow [phone]) on 08/15/2016 8:08:49 AM       Radiology US Abdomen Limited Ruq  Result Date: 08/15/2016 CLINICAL DATA:  Acute right upper quadrant pain, metastatic breast cancer to the liver EXAM: US ABDOMEN LIMITED - RIGHT UPPER QUADRANT COMPARISON:  07/24/2016 FINDINGS: Gallbladder: Echogenic shadowing gallstone noted measuring 1.7 cm. Normal wall thickness measuring 2.4 mm. No Murphy's sign. No pericholecystic fluid. Negative for cholecystitis by ultrasound. Common bile duct: Diameter: 4.6 mm Liver: Numerous solid hepatic metastases again evident, largest lesions are in the right lobe measuring up to 7.8 cm in diameter. No biliary dilatation or obstruction.  No ascites. IMPRESSION: Cholelithiasis. Negative for acute cholecystitis or biliary obstruction Extensive hepatic metastases Electronically Signed   By: Jerilynn Mages.  Shick M.D.   On: 08/15/2016 11:22    Procedures Procedures (including critical care time)  Medications Ordered in ED Medications  sodium chloride 0.9 % bolus 1,000  mL (0 mLs Intravenous Stopped 08/15/16 1234)  gi cocktail (Maalox,Lidocaine,Donnatal) (30 mLs Oral Given 08/15/16 0947)  ondansetron (ZOFRAN-ODT) disintegrating tablet 8 mg (8 mg Oral Given 08/15/16 1008)  famotidine (PEPCID) tablet 40 mg (40 mg Oral Given 08/15/16 1356)     Initial Impression / Assessment and Plan / ED Course  I have reviewed the triage vital signs and the nursing notes.  Pertinent labs & imaging results that were available during my care of the patient were reviewed by me and considered in my  medical decision making (see chart for details).     Pt w/ metastatic breast cancer who presents with burning pain in her chest radiating to her back that woke her from sleep last night, had several episodes of vomiting. She was well-appearing on exam, afebrile, mild hypertension on vital signs. She had mild right upper quadrant tenderness, no complaints of abdominal pain. I offered nausea medication but patient refused stating that she has side effects with most medications. I recommended lab work and right upper quadrant ultrasound as I did note on previous MRI that she does have gallstones.  US showed Cholelithiasis with no other abnormalities and no inflammation. Her lab work is notable for elevation of LFTs including AST 706, ALT 792, bilirubin 1.9. I discussed these findings with her oncologist, Dr. Jana Hakim, who stated that this was likely a result of her chemotherapy medications. He recommended stopping her Lajuan Lines and will follow her LFTs closely in the clinic. He agreed with plan to have her start PPI daily and use H2 blocker/ GI cocktail as needed for  Symptom relief.  Because patient states she has had the same symptoms previously and they are related to a feeling of reflux and bad taste in her mouth, I feel aortic dissection is extremely unlikely despite her report of radiation to the back. Her symptoms are also very atypical for ACS. The patient has continued to endorse that her  esophageal burning is related to her liver however I have explained that this is a symptom of acid reflux and does not relate to her elevated liver enzymes. I have attempted on multiple occasions to convince her to take a GI cocktail but she has refused, afraid that it will worsen her liver function. She was willing to take Pepcid and start her PPI at home daily. I provided her with GI cocktail prescription to use as needed at home. She will follow closely with her oncologist and understands return precautions. Patient discharged in satisfactory condition.  Final Clinical Impressions(s) / ED Diagnoses   Final diagnoses:  RUQ pain  Atypical chest pain  Gastroesophageal reflux disease with esophagitis    New Prescriptions Discharge Medication List as of 08/15/2016  3:11 PM    START taking these medications   Details  Alum & Mag Hydroxide-Simeth (GI COCKTAIL) SUSP suspension Take 30 mLs by mouth 3 (three) times daily as needed for indigestion. Shake well., Starting Fri 08/15/2016, Print    famotidine (PEPCID) 40 MG tablet Take 1 tablet (40 mg total) by mouth daily., Starting Fri 08/15/2016, Print         Little, Wenda Overland, MD 08/15/16 [phone]

## 2016-08-15 NOTE — ED Notes (Signed)
Attempted to start IV. Patient stated, "only specialists could start her IV and refused to let writer attempt. Charge nurse notified. Reminded patient that she was here for chest pain and stated, "OH no I am not I am a cancer patient and I have pressure in my esophagus."

## 2016-08-15 NOTE — ED Notes (Signed)
Attempted IV placement-IV team consult requested-MD aware

## 2016-08-15 NOTE — ED Notes (Signed)
Per oncologist, patient called complaining of "elephant standing on her chest"-advised patient to come to ED

## 2016-08-15 NOTE — ED Notes (Signed)
Pt aware of need for urine specimen. 

## 2016-08-15 NOTE — ED Triage Notes (Addendum)
Patient reports that she had upper chest and and esophagus pressure that radiated to the mid back in the early morning. Patient states she would have rated it a 10/10. Patient states she took Aciphex and now the pressure is 5/10. Patient states she vomited x 3 this AM. Patient states chest pressure was less after vomiting. Patient also c/o headache.

## 2016-08-15 NOTE — ED Notes (Signed)
[facility]-lab phlebotomy called for lab to draw blood due to poor venous access.

## 2016-08-18 ENCOUNTER — Other Ambulatory Visit: Payer: 59

## 2016-08-18 ENCOUNTER — Ambulatory Visit: Payer: 59 | Admitting: Physical Therapy

## 2016-08-18 DIAGNOSIS — M79651 Pain in right thigh: Secondary | ICD-10-CM | POA: Diagnosis not present

## 2016-08-18 DIAGNOSIS — I890 Lymphedema, not elsewhere classified: Secondary | ICD-10-CM

## 2016-08-18 NOTE — Therapy (Signed)
[place], [place], [address] Phone: [phone]   Fax:  [phone]  Physical Therapy Treatment  Patient Details  Name: [name] [name] MRN: [id number] Date of Birth: 1960-09-20 Referring Provider: Dr. Kyung Rudd  Encounter Date: 08/18/2016      PT End of Session - 08/18/16 1300    Visit Number [id number]  40 for lymph   Number of Visits 16  for lymph   Date for PT Re-Evaluation 09/15/16   Authorization - Visit Number 29   Authorization - Number of Visits 140  for 2018   PT Start Time 1022   PT Stop Time 1102   PT Time Calculation (min) 40 min   Activity Tolerance Patient tolerated treatment well   Behavior During Therapy [facility] for tasks assessed/performed      Past Medical History:  Diagnosis Date  . Bone metastases ([place]) dx'd 05/2014  . Breast cancer ([place]) dx'd 2005/2011  . Peripheral vascular disease ([place]) 02/2010   blood clot related to porta cath  . PONV (postoperative nausea and vomiting)   . S/P radiation therapy 07/17/2014 through 08/02/2014    Left mediastinum, left seventh rib 3250 cGy in 13 sessions   . S/P radiation therapy 12/11/2014 through 12/22/2014    Left parietal calvarium 2400 cGy in 8 sessions   . Seizures ([place]) 2010   Isolated incident.    Past Surgical History:  Procedure Laterality Date  . AXILLARY LYMPH NODE DISSECTION  Dec. 2011  . BREAST LUMPECTOMY  2005  . IR GENERIC HISTORICAL  05/21/2016   IR RADIOLOGIST EVAL & MGMT 05/21/2016 Sandi Mariscal, MD [facility] INTERV RAD  . MEDIASTINOTOMY CHAMBERLAIN MCNEIL Left 06/02/2013   Procedure: MEDIASTINOTOMY CHAMBERLAIN MCNEIL;  Surgeon: [name], MD;  Location: [facility];  Service: Thoracic;  Laterality: Left;  LEFT ANTERIOR MEDIASTINOTOMY   . PORTACATH PLACEMENT  12/11  .  removal portacath      There were no vitals filed for this visit.      Subjective Assessment - 08/18/16 1024    Subjective Liver counts are up.   Currently in Pain? Yes   Pain Score 7    Pain Location Axilla   Pain Orientation Right   Pain Descriptors / Indicators Other (Comment)  fullness   Pain Type Chronic pain   Pain Onset More than a month ago   Aggravating Factors  more swelling   Pain Relieving Factors therapy   Multiple Pain Sites Yes   Pain Score 2   Pain Location Groin   Pain Orientation Right   Pain Descriptors / Indicators Tightness   Aggravating Factors  more walking   Pain Relieving Factors therapy/myofascial release                         [facility] Adult PT Treatment/Exercise - 08/18/16 0001      Manual Therapy   Myofascial Release right axilla with focus on scar tightness   Manual Lymphatic Drainage (MLD) In left sidelying, posterior interaxillary anastomosis and right axillo-inguinal anastomosis; in supine, short neck, left axilla and anterior interaxillary anastomosis, right groin and axillo-inguinal anastomosis, and right upper extremity from fingers to shoulder.  In right sidelying, left periscapular area toward left groin (with extra time spent on the latter today).   Other Manual Therapy soft tissue work at superior aspect of right medial and upper thigh in prone position for pain relief and muscle relaxation, gentle myofascial release at upper  thigh, with releases today from far medial to lateral aspects of posterior thigh                  PT Short Term Goals - 02/12/16 1226      PT SHORT TERM GOAL #1   Title pain with walking decreased >/= 25%   Time 4   Period Weeks   Status Achieved           PT Long Term Goals - 08/05/16 1318      PT LONG TERM GOAL #1   Title indpendent with HEP   Time 8   Period Weeks   Status On-going     PT LONG TERM GOAL #2   Title pain with walking decreased >/= 75%   Time 8   Period  Weeks   Status On-going     PT LONG TERM GOAL #3   Title ability to flex her right hip with right groin pain decreased >/= 50% due to improved tissue mobility   Time 8   Period Weeks   Status Achieved     PT LONG TERM GOAL #4   Title waking up in middle of night with pain decreased >/=50%   Time 8   Period Weeks   Status Achieved     PT LONG TERM GOAL #5   Title reduction of pain by end of day >/= 50% due to increase tissue mobility   Time 8   Period Weeks   Status On-going           Long Term Clinic Goals - 08/04/16 1310      CC Long Term Goal  #2   Title Pt. will report swelling is adequately managed to enable ADL function at a consistent level.   Baseline We have continued to keep this from becoming more uncomfortable and she has had no infections.   Status On-going     CC Long Term Goal  #4   Title Pain/discomfort at right axilla area will be controlled at 6/10 or less.   Baseline She has maintained this level for some weeks now.   Status On-going     CC Long Term Goal  #5   Title Patient will avoid infection by ongoing management of her lymphedema at right breast/axilla/upper arm areas.   Baseline She has not had any infection.   Status On-going     CC Long Term Goal  #6   Title Pain in right groin/ischial tuberosity area will be controlled at 3/10 level or less to enable walking with less pain.   Baseline This pain usually stays in the 2-3 range, but at times increases to 4/10 if she has been on her feet and more active.   Status On-going            Plan - 08/18/16 1302    Clinical Impression Statement Pt. hasn't been seen for one week due to episode of increased pain with vomiting last Friday, so she was at Emergency Department during appointment time.  She has increased discomfort at right axilla area as a result.  Continues to benefit from therapy for keeping discomfort at a level she can function with.   Rehab Potential Good   Clinical Impairments  Affecting Rehab Potential active cancer   PT Frequency 2x / week   PT Duration 12 weeks   PT Treatment/Interventions ADLs/Self Care Home Management;Therapeutic activities;Therapeutic exercise;Patient/family education;Manual techniques;Manual lymph drainage;Scar mobilization   PT Next Visit Plan manual lymph drainage,  soft tissue, and myofascial release   Consulted and Agree with Plan of Care Patient      Patient will benefit from skilled therapeutic intervention in order to improve the following deficits and impairments:  Increased edema, Increased fascial restricitons, Pain  Visit Diagnosis: Pain in right thigh  Lymphedema, not elsewhere classified     Problem List Patient Active Problem List   Diagnosis Date Noted  . Liver metastases ([place]) 02/23/2016  . 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 Zoster 04/04/2015  . Nausea with vomiting 11/18/2014  . Constipation 11/18/2014  . Left-sided thoracic back pain   . Bone metastases ([place]) 11/16/2014  . Back pain 11/15/2014  . Uncontrolled pain 11/14/2014  . Post-lymphadenectomy lymphedema of arm 05/31/2014  . Chest wall pain 03/21/2014  . Abnormal LFTs (liver function tests) 09/12/2013  . Malignant neoplasm of upper-inner quadrant of right breast in female, estrogen receptor positive ([place]) 08/18/2013  . 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

## 2016-08-19 ENCOUNTER — Ambulatory Visit: Payer: 59

## 2016-08-19 ENCOUNTER — Encounter (HOSPITAL_BASED_OUTPATIENT_CLINIC_OR_DEPARTMENT_OTHER): Payer: 59 | Admitting: Oncology

## 2016-08-19 ENCOUNTER — Encounter: Payer: 59 | Admitting: Physical Therapy

## 2016-08-19 ENCOUNTER — Other Ambulatory Visit: Payer: 59

## 2016-08-19 ENCOUNTER — Other Ambulatory Visit: Payer: Self-pay | Admitting: *Deleted

## 2016-08-19 ENCOUNTER — Other Ambulatory Visit: Payer: Self-pay | Admitting: Oncology

## 2016-08-19 ENCOUNTER — Other Ambulatory Visit (HOSPITAL_BASED_OUTPATIENT_CLINIC_OR_DEPARTMENT_OTHER): Payer: 59

## 2016-08-19 DIAGNOSIS — Z79899 Other long term (current) drug therapy: Secondary | ICD-10-CM

## 2016-08-19 DIAGNOSIS — C50211 Malignant neoplasm of upper-inner quadrant of right female breast: Secondary | ICD-10-CM | POA: Diagnosis not present

## 2016-08-19 DIAGNOSIS — Z170 Estrogen receptor positive status [ER+]: Secondary | ICD-10-CM

## 2016-08-19 DIAGNOSIS — C771 Secondary and unspecified malignant neoplasm of intrathoracic lymph nodes: Secondary | ICD-10-CM

## 2016-08-19 DIAGNOSIS — C787 Secondary malignant neoplasm of liver and intrahepatic bile duct: Secondary | ICD-10-CM

## 2016-08-19 DIAGNOSIS — C7951 Secondary malignant neoplasm of bone: Secondary | ICD-10-CM

## 2016-08-19 LAB — CBC WITH DIFFERENTIAL/PLATELET
BASO%: 2 % (ref 0.0–2.0)
Basophils Absolute: 0.1 10*3/uL (ref 0.0–0.1)
EOS%: 2.8 % (ref 0.0–7.0)
Eosinophils Absolute: 0.1 10*3/uL (ref 0.0–0.5)
HEMATOCRIT: 34.6 % — AB (ref 34.8–46.6)
HEMOGLOBIN: 12.2 g/dL (ref 11.6–15.9)
LYMPH#: 0.5 10*3/uL — AB (ref 0.9–3.3)
LYMPH%: 12.4 % — ABNORMAL LOW (ref 14.0–49.7)
MCH: 36.9 pg — AB (ref 25.1–34.0)
MCHC: 35.3 g/dL (ref 31.5–36.0)
MCV: 104.5 fL — ABNORMAL HIGH (ref 79.5–101.0)
MONO#: 0.4 10*3/uL (ref 0.1–0.9)
MONO%: 11.2 % (ref 0.0–14.0)
NEUT#: 2.8 10*3/uL (ref 1.5–6.5)
NEUT%: 71.6 % (ref 38.4–76.8)
NRBC: 0 % (ref 0–0)
Platelets: 270 10*3/uL (ref 145–400)
RBC: 3.31 10*6/uL — ABNORMAL LOW (ref 3.70–5.45)
RDW: 13.1 % (ref 11.2–14.5)
WBC: 3.9 10*3/uL (ref 3.9–10.3)

## 2016-08-19 LAB — COMPREHENSIVE METABOLIC PANEL
ALBUMIN: 3.6 g/dL (ref 3.5–5.0)
ALK PHOS: 404 U/L — AB (ref 40–150)
ALT: 562 U/L — AB (ref 0–55)
AST: 334 U/L — AB (ref 5–34)
Anion Gap: 10 mEq/L (ref 3–11)
BILIRUBIN TOTAL: 5.23 mg/dL — AB (ref 0.20–1.20)
BUN: 5.8 mg/dL — AB (ref 7.0–26.0)
CALCIUM: 8.4 mg/dL (ref 8.4–10.4)
CO2: 23 mEq/L (ref 22–29)
CREATININE: 0.7 mg/dL (ref 0.6–1.1)
Chloride: 102 mEq/L (ref 98–109)
EGFR: >90 mL/min/{1.73_m2} (ref >90)
GLUCOSE: 127 mg/dL (ref 70–140)
POTASSIUM: 4.1 meq/L (ref 3.5–5.1)
SODIUM: 135 meq/L — AB (ref 136–145)
TOTAL PROTEIN: 6.5 g/dL (ref 6.4–8.3)

## 2016-08-19 LAB — TSH: TSH: 1.526 m[IU]/L (ref 0.308–3.960)

## 2016-08-20 LAB — CANCER ANTIGEN 27.29: CA 27.29: 8636 U/mL — ABNORMAL HIGH (ref 0.0–38.6)

## 2016-08-21 ENCOUNTER — Other Ambulatory Visit: Payer: Self-pay | Admitting: *Deleted

## 2016-08-21 DIAGNOSIS — C50211 Malignant neoplasm of upper-inner quadrant of right female breast: Secondary | ICD-10-CM

## 2016-08-21 DIAGNOSIS — Z170 Estrogen receptor positive status [ER+]: Principal | ICD-10-CM

## 2016-08-21 NOTE — Progress Notes (Signed)
The second came today for a nonscheduled visit. I spent approximately 25 minutes with her and her husband going over her liver function tests. We will be do not know what these mean. They may indicate treatment response or they may indicate toxicity from her tumor. I do not believe they indicate disease progression as her tumor has always been very slow in the past.  We will be repeating these again next week.  In the meantime she knows to call for any problems that may develop before This encounter was created in error - please disregard.

## 2016-08-22 ENCOUNTER — Ambulatory Visit: Payer: 59 | Admitting: Physical Therapy

## 2016-08-22 ENCOUNTER — Other Ambulatory Visit: Payer: 59

## 2016-08-22 DIAGNOSIS — I890 Lymphedema, not elsewhere classified: Secondary | ICD-10-CM

## 2016-08-22 DIAGNOSIS — M62838 Other muscle spasm: Secondary | ICD-10-CM

## 2016-08-22 DIAGNOSIS — M79651 Pain in right thigh: Secondary | ICD-10-CM | POA: Diagnosis not present

## 2016-08-22 NOTE — Therapy (Signed)
[place], [place], [address] Phone: [phone]   Fax:  [phone]  Physical Therapy Treatment  Patient Details  Name: [name] [name] MRN: [id number] Date of Birth: [birth year] Referring Provider: Dr. Kyung Rudd  Encounter Date: 08/22/2016      PT End of Session - 08/22/16 1206    Visit Number 4  41 for lymph   Number of Visits 2  for lymph   Date for PT Re-Evaluation 09/15/16   Authorization - Number of Visits 140   PT Start Time 1022   PT Stop Time 1104   PT Time Calculation (min) 42 min   Activity Tolerance Patient tolerated treatment well   Behavior During Therapy [facility] for tasks assessed/performed      Past Medical History:  Diagnosis Date  . Bone metastases ([place]) dx'd 05/2014  . Breast cancer ([place]) dx'd 2005/2011  . Peripheral vascular disease ([place]) 02/2010   blood clot related to porta cath  . PONV (postoperative nausea and vomiting)   . S/P radiation therapy 07/17/2014 through 08/02/2014    Left mediastinum, left seventh rib 3250 cGy in 13 sessions   . S/P radiation therapy 12/11/2014 through 12/22/2014    Left parietal calvarium 2400 cGy in 8 sessions   . Seizures ([place]) 2010   Isolated incident.    Past Surgical History:  Procedure Laterality Date  . AXILLARY LYMPH NODE DISSECTION  Dec. 2011  . BREAST LUMPECTOMY  2005  . IR GENERIC HISTORICAL  05/21/2016   IR RADIOLOGIST EVAL & MGMT 05/21/2016 Sandi Mariscal, MD [facility] INTERV RAD  . MEDIASTINOTOMY CHAMBERLAIN MCNEIL Left 06/02/2013   Procedure: MEDIASTINOTOMY CHAMBERLAIN MCNEIL;  Surgeon: [name], MD;  Location: [facility];  Service: Thoracic;  Laterality: Left;  LEFT ANTERIOR MEDIASTINOTOMY   . PORTACATH PLACEMENT  12/11  . removal portacath      There were no vitals  filed for this visit.          [facility] PT Assessment - 08/22/16 0001      Observation/Other Assessments   Observations Patient's skin and the whites of her eyes have a yellow cast today.                     [facility] Adult PT Treatment/Exercise - 08/22/16 0001      Manual Therapy   Manual Lymphatic Drainage (MLD) In left sidelying, posterior interaxillary anastomosis and right axillo-inguinal anastomosis; in supine, short neck, left axilla and anterior interaxillary anastomosis, right groin and axillo-inguinal anastomosis, and right upper extremity from fingers to shoulder.  In sitting with arms supported in front of her on pillows, left periscapular area toward left groin (with extra time spent on the latter today).   Other Manual Therapy In sitting, soft tissue work to neck and upper back, working on the right side more inferior to include area inferior to scapula, for muscle relaxation.                  PT Short Term Goals - 02/12/16 1226      PT SHORT TERM GOAL #1   Title pain with walking decreased >/= 25%   Time 4   Period Weeks   Status Achieved           PT Long Term Goals - 08/05/16 1318      PT LONG TERM GOAL #1   Title indpendent with HEP   Time 8   Period Weeks   Status  On-going     PT LONG TERM GOAL #2   Title pain with walking decreased >/= 75%   Time 8   Period Weeks   Status On-going     PT LONG TERM GOAL #3   Title ability to flex her right hip with right groin pain decreased >/= 50% due to improved tissue mobility   Time 8   Period Weeks   Status Achieved     PT LONG TERM GOAL #4   Title waking up in middle of night with pain decreased >/=50%   Time 8   Period Weeks   Status Achieved     PT LONG TERM GOAL #5   Title reduction of pain by end of day >/= 50% due to increase tissue mobility   Time 8   Period Weeks   Status On-going           Long Term Clinic Goals - 08/22/16 1210      CC Long Term Goal  #2    Title Pt. will report swelling is adequately managed to enable ADL function at a consistent level.   Status On-going     CC Long Term Goal  #4   Title Pain/discomfort at right axilla area will be controlled at 6/10 or less.   Status On-going     CC Long Term Goal  #5   Title Patient will avoid infection by ongoing management of her lymphedema at right breast/axilla/upper arm areas.   Status On-going     CC Long Term Goal  #6   Title Pain in right groin/ischial tuberosity area will be controlled at 3/10 level or less to enable walking with less pain.   Status On-going            Plan - 08/22/16 1207    Clinical Impression Statement Patient comes in today not feeling well, moving slowly, and looking jaundiced, reporting it is from the medications she's been on.  She had difficulty lying flat, so treatment was altered to keep her more upright.  Did not do leg myofascial work today because of difficulty positioning, and also to allow time to do soft tissue work at neck and upper back for pain relief. She has not been able to lie down to sleep, so this may be the cause of increased tightness there.   Rehab Potential Good   Clinical Impairments Affecting Rehab Potential active cancer   PT Frequency 2x / week   PT Duration 12 weeks   PT Treatment/Interventions ADLs/Self Care Home Management;Therapeutic activities;Therapeutic exercise;Patient/family education;Manual techniques;Manual lymph drainage;Scar mobilization   PT Next Visit Plan manual lymph drainage, soft tissue, and myofascial release   Consulted and Agree with Plan of Care Patient      Patient will benefit from skilled therapeutic intervention in order to improve the following deficits and impairments:  Increased edema, Increased fascial restricitons, Pain  Visit Diagnosis: Lymphedema, not elsewhere classified  Other muscle spasm     Problem List Patient Active Problem List   Diagnosis Date Noted  . Liver metastases  ([place]) 02/23/2016  . 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 Zoster 04/04/2015  . Nausea with vomiting 11/18/2014  . Constipation 11/18/2014  . Left-sided thoracic back pain   . Bone metastases ([place]) 11/16/2014  . Back pain 11/15/2014  . Uncontrolled pain 11/14/2014  . Post-lymphadenectomy lymphedema of arm 05/31/2014  . Chest wall pain 03/21/2014  . Abnormal LFTs (liver function tests) 09/12/2013  . Malignant neoplasm of upper-inner quadrant  of right breast in female, estrogen receptor positive ([place]) 08/18/2013  . 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

## 2016-08-26 ENCOUNTER — Ambulatory Visit: Payer: 59

## 2016-08-26 ENCOUNTER — Inpatient Hospital Stay (HOSPITAL_COMMUNITY): Payer: 59

## 2016-08-26 ENCOUNTER — Ambulatory Visit: Payer: 59 | Admitting: Physical Therapy

## 2016-08-26 ENCOUNTER — Encounter (HOSPITAL_COMMUNITY): Payer: Self-pay | Admitting: Emergency Medicine

## 2016-08-26 ENCOUNTER — Other Ambulatory Visit: Payer: 59

## 2016-08-26 ENCOUNTER — Encounter: Payer: 59 | Admitting: Physical Therapy

## 2016-08-26 ENCOUNTER — Inpatient Hospital Stay (HOSPITAL_COMMUNITY)
Admission: EM | Admit: 2016-08-26 | Discharge: 2016-08-30 | DRG: 442 | Disposition: A | Discharge status: Home / Self Care | Payer: 59 | Attending: Internal Medicine | Admitting: Internal Medicine

## 2016-08-26 ENCOUNTER — Emergency Department (HOSPITAL_COMMUNITY): Payer: 59

## 2016-08-26 DIAGNOSIS — Z86718 Personal history of other venous thrombosis and embolism: Secondary | ICD-10-CM | POA: Diagnosis not present

## 2016-08-26 DIAGNOSIS — Z888 Allergy status to other drugs, medicaments and biological substances status: Secondary | ICD-10-CM

## 2016-08-26 DIAGNOSIS — C7951 Secondary malignant neoplasm of bone: Secondary | ICD-10-CM | POA: Diagnosis present

## 2016-08-26 DIAGNOSIS — R945 Abnormal results of liver function studies: Secondary | ICD-10-CM | POA: Diagnosis present

## 2016-08-26 DIAGNOSIS — I890 Lymphedema, not elsewhere classified: Secondary | ICD-10-CM | POA: Diagnosis present

## 2016-08-26 DIAGNOSIS — G893 Neoplasm related pain (acute) (chronic): Secondary | ICD-10-CM | POA: Diagnosis not present

## 2016-08-26 DIAGNOSIS — D630 Anemia in neoplastic disease: Secondary | ICD-10-CM | POA: Diagnosis present

## 2016-08-26 DIAGNOSIS — C787 Secondary malignant neoplasm of liver and intrahepatic bile duct: Secondary | ICD-10-CM | POA: Diagnosis present

## 2016-08-26 DIAGNOSIS — E876 Hypokalemia: Secondary | ICD-10-CM | POA: Diagnosis not present

## 2016-08-26 DIAGNOSIS — J910 Malignant pleural effusion: Secondary | ICD-10-CM | POA: Diagnosis present

## 2016-08-26 DIAGNOSIS — Z803 Family history of malignant neoplasm of breast: Secondary | ICD-10-CM | POA: Diagnosis not present

## 2016-08-26 DIAGNOSIS — M8580 Other specified disorders of bone density and structure, unspecified site: Secondary | ICD-10-CM | POA: Diagnosis present

## 2016-08-26 DIAGNOSIS — M859 Disorder of bone density and structure, unspecified: Secondary | ICD-10-CM

## 2016-08-26 DIAGNOSIS — C50211 Malignant neoplasm of upper-inner quadrant of right female breast: Secondary | ICD-10-CM | POA: Diagnosis present

## 2016-08-26 DIAGNOSIS — K819 Cholecystitis, unspecified: Secondary | ICD-10-CM | POA: Diagnosis not present

## 2016-08-26 DIAGNOSIS — R110 Nausea: Secondary | ICD-10-CM | POA: Diagnosis not present

## 2016-08-26 DIAGNOSIS — K219 Gastro-esophageal reflux disease without esophagitis: Secondary | ICD-10-CM | POA: Diagnosis present

## 2016-08-26 DIAGNOSIS — I739 Peripheral vascular disease, unspecified: Secondary | ICD-10-CM | POA: Diagnosis present

## 2016-08-26 DIAGNOSIS — K716 Toxic liver disease with hepatitis, not elsewhere classified: Secondary | ICD-10-CM

## 2016-08-26 DIAGNOSIS — Z9221 Personal history of antineoplastic chemotherapy: Secondary | ICD-10-CM

## 2016-08-26 DIAGNOSIS — K8020 Calculus of gallbladder without cholecystitis without obstruction: Secondary | ICD-10-CM

## 2016-08-26 DIAGNOSIS — R1011 Right upper quadrant pain: Secondary | ICD-10-CM

## 2016-08-26 DIAGNOSIS — Z79811 Long term (current) use of aromatase inhibitors: Secondary | ICD-10-CM | POA: Diagnosis not present

## 2016-08-26 DIAGNOSIS — Z885 Allergy status to narcotic agent status: Secondary | ICD-10-CM | POA: Diagnosis not present

## 2016-08-26 DIAGNOSIS — K7689 Other specified diseases of liver: Secondary | ICD-10-CM | POA: Diagnosis not present

## 2016-08-26 DIAGNOSIS — Z79899 Other long term (current) drug therapy: Secondary | ICD-10-CM

## 2016-08-26 DIAGNOSIS — F419 Anxiety disorder, unspecified: Secondary | ICD-10-CM | POA: Diagnosis present

## 2016-08-26 DIAGNOSIS — R17 Unspecified jaundice: Secondary | ICD-10-CM

## 2016-08-26 DIAGNOSIS — K8013 Calculus of gallbladder with acute and chronic cholecystitis with obstruction: Secondary | ICD-10-CM

## 2016-08-26 DIAGNOSIS — Z923 Personal history of irradiation: Secondary | ICD-10-CM

## 2016-08-26 DIAGNOSIS — L298 Other pruritus: Secondary | ICD-10-CM | POA: Diagnosis not present

## 2016-08-26 DIAGNOSIS — K5900 Constipation, unspecified: Secondary | ICD-10-CM | POA: Diagnosis not present

## 2016-08-26 DIAGNOSIS — K719 Toxic liver disease, unspecified: Principal | ICD-10-CM | POA: Diagnosis present

## 2016-08-26 DIAGNOSIS — R140 Abdominal distension (gaseous): Secondary | ICD-10-CM | POA: Diagnosis present

## 2016-08-26 DIAGNOSIS — Z170 Estrogen receptor positive status [ER+]: Secondary | ICD-10-CM

## 2016-08-26 DIAGNOSIS — R7989 Other specified abnormal findings of blood chemistry: Secondary | ICD-10-CM | POA: Diagnosis present

## 2016-08-26 DIAGNOSIS — T50905A Adverse effect of unspecified drugs, medicaments and biological substances, initial encounter: Secondary | ICD-10-CM

## 2016-08-26 DIAGNOSIS — C782 Secondary malignant neoplasm of pleura: Secondary | ICD-10-CM | POA: Diagnosis present

## 2016-08-26 DIAGNOSIS — C50919 Malignant neoplasm of unspecified site of unspecified female breast: Secondary | ICD-10-CM

## 2016-08-26 LAB — CBC WITH DIFFERENTIAL/PLATELET
BASOS ABS: 0.1 10*3/uL (ref 0.0–0.1)
Basophils Relative: 1 %
Eosinophils Absolute: 0.1 10*3/uL (ref 0.0–0.7)
Eosinophils Relative: 1 %
HCT: 33 % — ABNORMAL LOW (ref 36.0–46.0)
Hemoglobin: 11.6 g/dL — ABNORMAL LOW (ref 12.0–15.0)
Lymphocytes Relative: 5 %
Lymphs Abs: 0.4 10*3/uL — ABNORMAL LOW (ref 0.7–4.0)
MCH: 36 pg — ABNORMAL HIGH (ref 26.0–34.0)
MCHC: 35.2 g/dL (ref 30.0–36.0)
MCV: 102.5 fL — AB (ref 78.0–100.0)
MONO ABS: 0.9 10*3/uL (ref 0.1–1.0)
MONOS PCT: 13 %
Neutro Abs: 6 10*3/uL (ref 1.7–7.7)
Neutrophils Relative %: 80 %
PLATELETS: 542 10*3/uL — AB (ref 150–400)
RBC: 3.22 MIL/uL — ABNORMAL LOW (ref 3.87–5.11)
RDW: 14.1 % (ref 11.5–15.5)
WBC: 7.4 10*3/uL (ref 4.0–10.5)

## 2016-08-26 LAB — COMPREHENSIVE METABOLIC PANEL
ALT: 370 U/L — ABNORMAL HIGH (ref 14–54)
ANION GAP: 9 (ref 5–15)
AST: 306 U/L — AB (ref 15–41)
Albumin: 3.2 g/dL — ABNORMAL LOW (ref 3.5–5.0)
Alkaline Phosphatase: 542 U/L — ABNORMAL HIGH (ref 38–126)
BUN: 6 mg/dL (ref 6–20)
CHLORIDE: 98 mmol/L — AB (ref 101–111)
CO2: 22 mmol/L (ref 22–32)
Calcium: 8 mg/dL — ABNORMAL LOW (ref 8.9–10.3)
Creatinine, Ser: 0.34 mg/dL — ABNORMAL LOW (ref 0.44–1.00)
Glucose, Bld: 123 mg/dL — ABNORMAL HIGH (ref 65–99)
POTASSIUM: 3.8 mmol/L (ref 3.5–5.1)
Sodium: 129 mmol/L — ABNORMAL LOW (ref 135–145)
Total Bilirubin: 12.5 mg/dL — ABNORMAL HIGH (ref 0.3–1.2)
Total Protein: 6.8 g/dL (ref 6.5–8.1)

## 2016-08-26 LAB — I-STAT CHEM 8, ED
BUN: 4 mg/dL — ABNORMAL LOW (ref 6–20)
CHLORIDE: 98 mmol/L — AB (ref 101–111)
Calcium, Ion: 1.02 mmol/L — ABNORMAL LOW (ref 1.15–1.40)
Creatinine, Ser: 0.5 mg/dL (ref 0.44–1.00)
Glucose, Bld: 121 mg/dL — ABNORMAL HIGH (ref 65–99)
HEMATOCRIT: 36 % (ref 36.0–46.0)
HEMOGLOBIN: 12.2 g/dL (ref 12.0–15.0)
POTASSIUM: 3.9 mmol/L (ref 3.5–5.1)
SODIUM: 131 mmol/L — AB (ref 135–145)
TCO2: 25 mmol/L (ref 0–100)

## 2016-08-26 LAB — URINALYSIS, ROUTINE W REFLEX MICROSCOPIC
Glucose, UA: NEGATIVE mg/dL
KETONES UR: 20 mg/dL — AB
Leukocytes, UA: NEGATIVE
NITRITE: NEGATIVE
PH: 5 (ref 5.0–8.0)
Protein, ur: NEGATIVE mg/dL
Specific Gravity, Urine: 1.015 (ref 1.005–1.030)

## 2016-08-26 LAB — LIPASE, BLOOD: LIPASE: 29 U/L (ref 11–51)

## 2016-08-26 MED ORDER — TECHNETIUM TC 99M MEBROFENIN IV KIT
7.9000 | PACK | Freq: Once | INTRAVENOUS | Status: AC | PRN
  Administered 2016-08-26: 7.9 via INTRAVENOUS

## 2016-08-26 MED ORDER — ONDANSETRON HCL 4 MG/2ML IJ SOLN
4.0000 mg | Freq: Four times a day (QID) | INTRAMUSCULAR | Status: DC | PRN
  Administered 2016-08-29: 4 mg via INTRAVENOUS
  Filled 2016-08-26: qty 2

## 2016-08-26 MED ORDER — KETOROLAC TROMETHAMINE 30 MG/ML IJ SOLN
30.0000 mg | Freq: Once | INTRAMUSCULAR | Status: AC
  Administered 2016-08-26: 30 mg via INTRAVENOUS
  Filled 2016-08-26: qty 1

## 2016-08-26 MED ORDER — PIPERACILLIN-TAZOBACTAM 3.375 G IVPB 30 MIN
3.3750 g | Freq: Once | INTRAVENOUS | Status: AC
  Administered 2016-08-26: 3.375 g via INTRAVENOUS
  Filled 2016-08-26: qty 50

## 2016-08-26 MED ORDER — HEPARIN SODIUM (PORCINE) 5000 UNIT/ML IJ SOLN
5000.0000 [IU] | Freq: Three times a day (TID) | INTRAMUSCULAR | Status: DC
  Administered 2016-08-26 – 2016-08-27 (×2): 5000 [IU] via SUBCUTANEOUS
  Filled 2016-08-26 (×4): qty 1

## 2016-08-26 MED ORDER — KETOROLAC TROMETHAMINE 30 MG/ML IJ SOLN
15.0000 mg | Freq: Four times a day (QID) | INTRAMUSCULAR | Status: AC | PRN
  Administered 2016-08-26 – 2016-08-28 (×6): 15 mg via INTRAVENOUS
  Filled 2016-08-26 (×7): qty 1

## 2016-08-26 MED ORDER — FAMOTIDINE 20 MG PO TABS
40.0000 mg | ORAL_TABLET | Freq: Every day | ORAL | Status: DC
  Administered 2016-08-26 – 2016-08-27 (×2): 20 mg via ORAL
  Administered 2016-08-27 – 2016-08-30 (×4): 40 mg via ORAL
  Filled 2016-08-26 (×6): qty 2

## 2016-08-26 MED ORDER — ONDANSETRON HCL 4 MG PO TABS: 4.0000 mg | ORAL_TABLET | Freq: Four times a day (QID) | ORAL | Status: DC | PRN

## 2016-08-26 MED ORDER — FAMOTIDINE IN NACL 20-0.9 MG/50ML-% IV SOLN
20.0000 mg | Freq: Once | INTRAVENOUS | Status: AC
  Administered 2016-08-26: 20 mg via INTRAVENOUS
  Filled 2016-08-26: qty 50

## 2016-08-26 MED ORDER — ALBUTEROL SULFATE (2.5 MG/3ML) 0.083% IN NEBU
2.5000 mg | INHALATION_SOLUTION | RESPIRATORY_TRACT | Status: DC | PRN
Start: 2016-08-26 — End: 2016-08-30

## 2016-08-26 MED ORDER — BISACODYL 10 MG RE SUPP: 10.0000 mg | Freq: Every day | RECTAL | Status: DC | PRN

## 2016-08-26 MED ORDER — SODIUM CHLORIDE 0.9 % IV SOLN
INTRAVENOUS | Status: DC
  Administered 2016-08-26 – 2016-08-29 (×4): via INTRAVENOUS

## 2016-08-26 MED ORDER — PIPERACILLIN-TAZOBACTAM 3.375 G IVPB
3.3750 g | Freq: Three times a day (TID) | INTRAVENOUS | Status: DC
  Administered 2016-08-26 – 2016-08-30 (×12): 3.375 g via INTRAVENOUS
  Filled 2016-08-26 (×14): qty 50

## 2016-08-26 MED ORDER — LORAZEPAM 2 MG/ML IJ SOLN: 0.5000 mg | Freq: Three times a day (TID) | INTRAMUSCULAR | Status: DC | PRN

## 2016-08-26 MED ORDER — FENTANYL CITRATE (PF) 100 MCG/2ML IJ SOLN
50.0000 ug | Freq: Once | INTRAMUSCULAR | Status: DC
  Filled 2016-08-26: qty 2

## 2016-08-26 MED ORDER — DICYCLOMINE HCL 10 MG/ML IM SOLN
20.0000 mg | Freq: Once | INTRAMUSCULAR | Status: AC
  Administered 2016-08-26: 20 mg via INTRAMUSCULAR
  Filled 2016-08-26 (×2): qty 2

## 2016-08-26 MED ORDER — CALCIUM CARBONATE ANTACID 500 MG PO CHEW
1.0000 | CHEWABLE_TABLET | Freq: Three times a day (TID) | ORAL | Status: DC | PRN
  Administered 2016-08-26 – 2016-08-29 (×3): 200 mg via ORAL
  Filled 2016-08-26 (×3): qty 1

## 2016-08-26 MED ORDER — VANCOMYCIN HCL IN DEXTROSE 1-5 GM/200ML-% IV SOLN
1000.0000 mg | Freq: Once | INTRAVENOUS | Status: DC
  Filled 2016-08-26: qty 200

## 2016-08-26 MED ORDER — SODIUM CHLORIDE 0.9 % IV BOLUS (SEPSIS): 1000.0000 mL | Freq: Once | INTRAVENOUS | Status: DC

## 2016-08-26 MED ORDER — KETOROLAC TROMETHAMINE 30 MG/ML IJ SOLN
30.0000 mg | Freq: Four times a day (QID) | INTRAMUSCULAR | Status: DC | PRN
  Administered 2016-08-26 (×2): 30 mg via INTRAVENOUS
  Filled 2016-08-26 (×2): qty 1

## 2016-08-26 NOTE — ED Notes (Signed)
Called [facility] to give report and the staff is in huddle.  The nurse will call back when she is finished.

## 2016-08-26 NOTE — ED Provider Notes (Signed)
[facility] DEPT Provider Note   CSN: [id number] Arrival date & time: 08/26/16  0215  By signing my name below, I, Hansel Feinstein, attest that this documentation has been prepared under the direction and in the presence of [place], Laker Thompson, MD. Electronically Signed: Hansel Feinstein, ED Scribe. 08/26/16. 4:13 AM.     History   Chief Complaint Chief Complaint  Patient presents with  . Abdominal Pain    HPI [name] [name] is a 56 y.o. female with h/o gallstones, breast cancer with metastases to the bone and liver, liver toxicity who presents to the Emergency Department complaining of severe right upper quadrant abdominal pain that began yesterday at 5 PM. She reports she recently has not been taking Tums, and reports that changes in her medications/diet tend to trigger these kinds of symptoms. Per pt, she had chicken stock and noodles prior to the onset of her pain. She has been applying ice with minimal relief of her pain. Her pain is worsened with movement. She denies itchiness, change in baseline jaundice, additional complaints. Husband also notes dark urine and white BMs after the first round of a new chemo medication, Keytruda.   The history is provided by the patient. No language interpreter was used.  Abdominal Pain   This is a recurrent problem. The current episode started 6 to 12 hours ago. The problem occurs constantly. The problem has not changed since onset.The pain is associated with eating. The pain is located in the RUQ. The pain is moderate. Pertinent negatives include fever. The symptoms are aggravated by activity. Relieved by: ice. Past workup includes surgery.    Past Medical History:  Diagnosis Date  . Bone metastases ([place]) dx'd 05/2014  . Breast cancer ([place]) dx'd 2005/2011  . Peripheral vascular disease ([place]) 02/2010   blood clot related to porta cath  . PONV (postoperative nausea and vomiting)   . S/P radiation therapy 07/17/2014 through  08/02/2014    Left mediastinum, left seventh rib 3250 cGy in 13 sessions   . S/P radiation therapy 12/11/2014 through 12/22/2014    Left parietal calvarium 2400 cGy in 8 sessions   . Seizures ([place]) 2010   Isolated incident.    Patient Active Problem List   Diagnosis Date Noted  . Liver metastases ([place]) 02/23/2016  . 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 Zoster 04/04/2015  . Nausea with vomiting 11/18/2014  . Constipation 11/18/2014  . Left-sided thoracic back pain   . Bone metastases ([place]) 11/16/2014  . Back pain 11/15/2014  . Uncontrolled pain 11/14/2014  . Post-lymphadenectomy lymphedema of arm 05/31/2014  . Chest wall pain 03/21/2014  . Abnormal LFTs (liver function tests) 09/12/2013  . Malignant neoplasm of upper-inner quadrant of right breast in female, estrogen receptor positive ([place]) 08/18/2013  . 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 AXILLARY LYMPH NODE DISSECTION  Dec. 2011  . BREAST LUMPECTOMY  2005  . IR GENERIC HISTORICAL  05/21/2016   IR RADIOLOGIST EVAL & MGMT 05/21/2016 Sandi Mariscal, MD [facility] INTERV RAD  . MEDIASTINOTOMY CHAMBERLAIN MCNEIL Left 06/02/2013   Procedure: MEDIASTINOTOMY CHAMBERLAIN MCNEIL;  Surgeon: [name], MD;  Location: [facility];  Service: Thoracic;  Laterality: Left;  LEFT ANTERIOR MEDIASTINOTOMY   . PORTACATH PLACEMENT  12/11  . removal portacath      OB History    No data available       Home Medications    Prior to  Admission medications   Medication Sig Start Date End Date Taking? Authorizing Provider  ALPRAZolam Duanne Moron) 0.5 MG tablet Take 1 tablet (0.5 mg total) by mouth 2 (two) times daily as needed for anxiety. Patient taking differently: Take 0.25-0.5 mg by mouth 2 (two) times  daily as needed for anxiety.  03/03/16   Magrinat, Virgie Dad, MD  Alum & Mag Hydroxide-Simeth (GI COCKTAIL) SUSP suspension Take 30 mLs by mouth 3 (three) times daily as needed for indigestion. Shake well. 08/15/16   Little, Wenda Overland, MD  B Complex-C (B-COMPLEX WITH VITAMIN C) tablet Take 1 tablet by mouth daily. Reported on 03/27/2015 01/19/15   Magrinat, Virgie Dad, MD  calcium carbonate (TUMS - DOSED IN MG ELEMENTAL CALCIUM) 500 MG chewable tablet Chew 1 tablet by mouth as directed.    [provider]  cholecalciferol 2000 UNITS tablet Take 1 tablet (2,000 Units total) by mouth daily. 01/19/15   Magrinat, Virgie Dad, MD  Diphenhyd-Hydrocort-Nystatin (FIRST-DUKES MOUTHWASH) SUSP 5-10 ml qid SWISH AND SPIT Patient not taking: Reported on 08/15/2016 05/09/15   Magrinat, Virgie Dad, MD  famotidine (PEPCID) 40 MG tablet Take 1 tablet (40 mg total) by mouth daily. 08/15/16   Little, Wenda Overland, MD  folic acid (FOLVITE) 1 MG tablet Take 1 tablet (1 mg total) by mouth daily. 01/19/15   Magrinat, Virgie Dad, MD  letrozole [facility]) 2.5 MG tablet Take 2.5 mg by mouth at bedtime. 07/22/16   [provider]  loratadine-pseudoephedrine (CLARITIN-D 24-HOUR) 10-240 MG 24 hr tablet Take 1 tablet by mouth daily.    [provider]  Melatonin 3 MG TABS Take 3 mg by mouth at bedtime.    [provider]  palbociclib [name]) 75 MG capsule Take 1 capsule (75 mg total) by mouth every other day. Take whole with food. 07/25/16   Magrinat, Virgie Dad, MD  phenazopyridine (PYRIDIUM) 100 MG tablet Take 1 tablet (100 mg total) by mouth 3 (three) times daily as needed for pain. Patient not taking: Reported on 08/15/2016 05/16/16   Magrinat, Virgie Dad, MD  RABEprazole Sodium 5 MG CPSP Take 5 mg by mouth daily. 10/08/15   Magrinat, Virgie Dad, MD  saccharomyces boulardii (FLORASTOR) 250 MG capsule Take 250 mg by mouth daily.  03/23/15   Magrinat, Virgie Dad, MD  vitamin E (VITAMIN E) 400 UNIT capsule Take 1  capsule (400 Units total) by mouth 2 (two) times daily. Patient not taking: Reported on 08/15/2016 05/16/16   Kyung Rudd, MD    Family History Family History  Problem Relation Age of Onset  . COPD Mother   . Breast cancer Sister 15    Social History Social History  Substance Use Topics  . Smoking status: Never Smoker  . Smokeless tobacco: Never Used  . Alcohol use No     Allergies   2nd skin quick heal; Decadron [dexamethasone]; Dilaudid [hydromorphone]; Enoxaparin; Fluconazole; Hydromorphone hcl; Morphine and related; Protonix [pantoprazole sodium]; and Tegaderm ag mesh [silver]   Review of Systems Review of Systems  Constitutional: Negative for appetite change, chills and fever.  HENT: Negative for drooling and facial swelling.   Eyes: Negative for photophobia.  Respiratory: Negative for shortness of breath.   Cardiovascular: Negative for chest pain, palpitations and leg swelling.  Gastrointestinal: Positive for abdominal pain. Negative for anal bleeding.  Genitourinary: Negative for difficulty urinating.  Musculoskeletal: Negative for neck stiffness.  Skin: Negative for pallor.  Neurological: Negative for facial asymmetry and speech difficulty.  Psychiatric/Behavioral: Negative for suicidal ideas.  All other systems reviewed and are negative.    Physical Exam Updated Vital Signs BP 139/87 (BP Location: Left Arm)   Pulse (!) 108   Temp 98.5 F (36.9 C) (Oral)   Resp 16   SpO2 96%   Physical Exam  Constitutional: She is oriented to person, place, and time. She appears well-developed and well-nourished.  HENT:  Head: Normocephalic and atraumatic.  Mouth/Throat: Oropharynx is clear and moist. No oropharyngeal exudate.  Moist mucous membranes. No exudates. Jaundice under the tongue and the roof of the mouth   Eyes: Conjunctivae and EOM are normal. Pupils are equal, round, and reactive to light. Scleral icterus is present.  Neck: Normal range of motion. Neck  supple. No JVD present. No tracheal deviation present.  No carotid bruits. Trachea midline.   Cardiovascular: Normal rate, regular rhythm, normal heart sounds and intact distal pulses.  Exam reveals no gallop and no friction rub.   No murmur heard. RRR.   Pulmonary/Chest: Effort normal and breath sounds normal. No stridor. No respiratory distress. She has no wheezes. She has no rales.  Lungs CTA bilaterally.   Abdominal: Soft. Bowel sounds are normal. She exhibits no distension and no mass. There is tenderness. There is no rebound and no guarding.  Positive Murphy's sign. RUQ tenderness. Liver 16 cm in height and 6 cm below the costal margin.   Musculoskeletal: Normal range of motion.  Lymphadenopathy:    She has no cervical adenopathy.  Neurological: She is alert and oriented to person, place, and time. She has normal reflexes. She displays normal reflexes.  Skin: Skin is warm and dry.  Jaundice to the level of the lower abdomen and the elbow   Psychiatric: She has a normal mood and affect.  Nursing note and vitals reviewed.    ED Treatments / Results   DIAGNOSTIC STUDIES: Oxygen Saturation is 96% on RA, adequate by my interpretation.    COORDINATION OF CARE: 4:06 AM Discussed treatment plan with pt at bedside which includes labs and pt agreed to plan.    Labs (all labs ordered are listed, but only abnormal results are displayed)  Results for orders placed or performed during the hospital encounter of 08/26/16  CBC with Differential/Platelet  Result Value Ref Range   WBC 7.4 4.0 - 10.5 K/uL   RBC 3.22 (L) 3.87 - 5.11 MIL/uL   Hemoglobin 11.6 (L) 12.0 - 15.0 g/dL   HCT 33.0 (L) 36.0 - 46.0 %   MCV 102.5 (H) 78.0 - 100.0 fL   MCH 36.0 (H) 26.0 - 34.0 pg   MCHC 35.2 30.0 - 36.0 g/dL   RDW 14.1 11.5 - 15.5 %   Platelets 542 (H) 150 - 400 K/uL   Neutrophils Relative % 80 %   Neutro Abs 6.0 1.7 - 7.7 K/uL   Lymphocytes Relative 5 %   Lymphs Abs 0.4 (L) 0.7 - 4.0 K/uL    Monocytes Relative 13 %   Monocytes Absolute 0.9 0.1 - 1.0 K/uL   Eosinophils Relative 1 %   Eosinophils Absolute 0.1 0.0 - 0.7 K/uL   Basophils Relative 1 %   Basophils Absolute 0.1 0.0 - 0.1 K/uL  Comprehensive metabolic panel  Result Value Ref Range   Sodium 129 (L) 135 - 145 mmol/L   Potassium 3.8 3.5 - 5.1 mmol/L   Chloride 98 (L) 101 - 111 mmol/L   CO2 22 22 - 32 mmol/L   Glucose, Bld 123 (H) 65 - 99 mg/dL   BUN 6  6 - 20 mg/dL   Creatinine, Ser 0.34 (L) 0.44 - 1.00 mg/dL   Calcium 8.0 (L) 8.9 - 10.3 mg/dL   Total Protein 6.8 6.5 - 8.1 g/dL   Albumin 3.2 (L) 3.5 - 5.0 g/dL   AST 306 (H) 15 - 41 U/L   ALT 370 (H) 14 - 54 U/L   Alkaline Phosphatase 542 (H) 38 - 126 U/L   Total Bilirubin 12.5 (H) 0.3 - 1.2 mg/dL   GFR calc non Af Amer >60 >60 mL/min   GFR calc Af Amer >60 >60 mL/min   Anion gap 9 5 - 15  Lipase, blood  Result Value Ref Range   Lipase 29 11 - 51 U/L  Urinalysis, Routine w reflex microscopic  Result Value Ref Range   Color, Urine AMBER (A) YELLOW   APPearance CLEAR CLEAR   Specific Gravity, Urine 1.015 1.005 - 1.030   pH 5.0 5.0 - 8.0   Glucose, UA NEGATIVE NEGATIVE mg/dL   Hgb urine dipstick SMALL (A) NEGATIVE   Bilirubin Urine MODERATE (A) NEGATIVE   Ketones, ur 20 (A) NEGATIVE mg/dL   Protein, ur NEGATIVE NEGATIVE mg/dL   Nitrite NEGATIVE NEGATIVE   Leukocytes, UA NEGATIVE NEGATIVE   RBC / HPF 0-5 0 - 5 RBC/hpf   WBC, UA 0-5 0 - 5 WBC/hpf   Bacteria, UA RARE (A) NONE SEEN   Squamous Epithelial / LPF 0-5 (A) NONE SEEN   Mucous PRESENT    Hyaline Casts, UA PRESENT   I-Stat Chem 8, ED  Result Value Ref Range   Sodium 131 (L) 135 - 145 mmol/L   Potassium 3.9 3.5 - 5.1 mmol/L   Chloride 98 (L) 101 - 111 mmol/L   BUN 4 (L) 6 - 20 mg/dL   Creatinine, Ser 0.50 0.44 - 1.00 mg/dL   Glucose, Bld 121 (H) 65 - 99 mg/dL   Calcium, Ion 1.02 (L) 1.15 - 1.40 mmol/L   TCO2 25 0 - 100 mmol/L   Hemoglobin 12.2 12.0 - 15.0 g/dL   HCT 36.0 36.0 - 46.0 %    *Note: Due to a large number of results and/or encounters for the requested time period, some results have not been displayed. A complete set of results can be found in Results Review.   Mr Jeri Cos Wo Contrast  Result Date: 08/07/2016 CLINICAL DATA:  Breast cancer with metastatic disease. EXAM: MRI HEAD WITHOUT AND WITH CONTRAST TECHNIQUE: Multiplanar, multiecho pulse sequences of the brain and surrounding structures were obtained without and with intravenous contrast. CONTRAST:  35mL MULTIHANCE GADOBENATE DIMEGLUMINE 529 MG/ML IV SOLN COMPARISON:  CT head 03/13/2015 FINDINGS: Brain: Ventricle size normal. Negative for acute or chronic infarction. No intracranial hemorrhage or mass. No evidence of metastatic disease to the brain. Leptomeningeal enhancement is normal. Vascular: Normal arterial flow void. Skull and upper cervical spine: 2 cm enhancing mass lesion in the left parietal bone compatible with metastatic disease unchanged. 13 mm enhancing lesion in the right parietal bone anteriorly also unchanged and compatible with metastatic disease. Sinuses/Orbits: Negative Other: None IMPRESSION: Skull lesions in the parietal bone bilaterally are stable. No evidence of metastatic disease to the brain. No acute intracranial abnormality. Electronically Signed   By: Franchot Gallo M.D.   On: 08/07/2016 15:52   US Abdomen Complete  Result Date: 08/26/2016 CLINICAL DATA:  RIGHT upper quadrant pain beginning yesterday. Jaundice. History of metastatic breast cancer with known liver metastasis. EXAM: ABDOMEN ULTRASOUND COMPLETE COMPARISON:  Abdominal ultrasound Aug 15, 2016 and  MRI of the abdomen Adaiah Morken 26, 2018 FINDINGS: Gallbladder: 1.9 cm echogenic gallstone with acoustic shadowing surrounded by mildly echogenic sludge with increased through transmission. Mild gallbladder wall thickening without pericholecystic fluid. Sonographic Murphy's sign elicited. Common bile duct: Diameter: 5 mm Liver: Multiple  heterogeneous liver mass is consistent with patient's known metastasis. Porta hepatis lymphadenopathy present though, better characterized on prior MRI. IVC: No abnormality visualized. Pancreas: Visualized portion unremarkable. Spleen: Size and appearance within normal limits. Right Kidney: Length: 11.5 cm. Echogenicity within normal limits. No mass or hydronephrosis visualized. Left Kidney: Length: 11.8 cm. Echogenicity within normal limits. No mass or hydronephrosis visualized. Abdominal aorta: No aneurysm visualized. Other findings: None. IMPRESSION: Cholelithiasis and sonographic findings of acute cholecystitis. Numerous hepatic and nodal metastasis. Electronically Signed   By: Elon Alas M.D.   On: 08/26/2016 06:36   US Abdomen Limited Ruq  Result Date: 08/15/2016 CLINICAL DATA:  Acute right upper quadrant pain, metastatic breast cancer to the liver EXAM: US ABDOMEN LIMITED - RIGHT UPPER QUADRANT COMPARISON:  07/24/2016 FINDINGS: Gallbladder: Echogenic shadowing gallstone noted measuring 1.7 cm. Normal wall thickness measuring 2.4 mm. No Murphy's sign. No pericholecystic fluid. Negative for cholecystitis by ultrasound. Common bile duct: Diameter: 4.6 mm Liver: Numerous solid hepatic metastases again evident, largest lesions are in the right lobe measuring up to 7.8 cm in diameter. No biliary dilatation or obstruction.  No ascites. IMPRESSION: Cholelithiasis. Negative for acute cholecystitis or biliary obstruction Extensive hepatic metastases Electronically Signed   By: Jerilynn Mages.  Shick M.D.   On: 08/15/2016 11:22    Radiology No results found.  Procedures Procedures (including critical care time)  Medications Ordered in ED  Medications  0.9 %  sodium chloride infusion ( Intravenous New Bag/Given 08/26/16 0534)  fentaNYL (SUBLIMAZE) injection 50 mcg (50 mcg Intravenous Not Given 08/26/16 0534)  vancomycin (VANCOCIN) IVPB 1000 mg/200 mL premix (not administered)  piperacillin-tazobactam (ZOSYN)  IVPB 3.375 g (not administered)  dicyclomine (BENTYL) injection 20 mg (not administered)  famotidine (PEPCID) IVPB 20 mg premix (not administered)  ketorolac (TORADOL) 30 MG/ML injection 30 mg (30 mg Intravenous Given 08/26/16 0533)     Case d/w Dr. Lucia Gaskins, CCS will consult admit to medicine  Case d/w Dr. Jana Hakim who will see the patient in consult,  He is encouraged by LFT improvement.     Very happy with care   Final Clinical Impressions(s) / ED Diagnoses  Drug induced hepatitis and cholecystitis: admit to medicine  I personally performed the services described in this documentation, which was scribed in my presence. The recorded information has been reviewed and is accurate.      Adyn Serna, MD 08/26/16 [phone]

## 2016-08-26 NOTE — Consult Note (Signed)
Consultation  Referring Provider: Dr. Candiss Norse     Primary Care Physician:  Magrinat, Virgie Dad, MD Primary Gastroenterologist: Althia Forts         Reason for Consultation:  Elevated LFT's, Abnormal U/S abdomen            HPI:   [name] [name] is a 56 y.o. 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   Today, the patient is accompanied by her husband who does assist with her history. 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 The patient describes that her first drug-induced liver injury took her at least 4 weeks to overcome. Recently, she was given Keytruda chemotherapy about 2 weeks ago and around that time did develop elevated LFTs. She has had no further Keytruda infusions and, in fact, is currently only on AcipHex and one chemo therapy at night due to her recent finding of elevated liver enzymes. The patient does explain that she started with a right upper quadrant pain yesterday around 5:00 which "hurts worse when= she coughs or takes a deep breath". Apparently this has never occurred before with her other liver injuries. Associated symptoms included a right upper quadrant shoulder pain which started about 2 weeks ago per the patient, she thought this was due to increase in her reflux over this time. Patient also describes a general decrease in appetite.   Patient denies fever, chills, change in bowel habits, weight loss or vomiting.  No previous GI history  Past Medical History:  Diagnosis Date  . Bone metastases ([place]) dx'd 05/2014  . Breast cancer ([place]) dx'd 2005/2011  . Peripheral  vascular disease ([place]) 02/2010   blood clot related to porta cath  . PONV (postoperative nausea and vomiting)   . S/P radiation therapy 07/17/2014 through 08/02/2014    Left mediastinum, left seventh rib 3250 cGy in 13 sessions   . S/P radiation therapy 12/11/2014 through 12/22/2014    Left parietal calvarium 2400 cGy in 8 sessions   . Seizures ([place]) 2010   Isolated incident.    Past Surgical History:  Procedure Laterality Date  . AXILLARY LYMPH NODE DISSECTION  Dec. 2011  . BREAST LUMPECTOMY  2005  . IR GENERIC HISTORICAL  05/21/2016   IR RADIOLOGIST EVAL & MGMT 05/21/2016 Sandi Mariscal, MD [facility] INTERV RAD  . MEDIASTINOTOMY CHAMBERLAIN MCNEIL Left 06/02/2013   Procedure: MEDIASTINOTOMY CHAMBERLAIN MCNEIL;  Surgeon: [name], MD;  Location: [facility];  Service: Thoracic;  Laterality: Left;  LEFT ANTERIOR MEDIASTINOTOMY   . PORTACATH PLACEMENT  12/11  . removal portacath      Family History  Problem Relation Age of Onset  . COPD Mother   . Breast cancer Sister 64    Social History  Substance Use Topics  . Smoking status: Never Smoker  . Smokeless tobacco: Never Used  . Alcohol use No    Prior to Admission medications   Medication Sig Start Date End Date Taking? Authorizing Provider  B Complex-C (B-COMPLEX WITH VITAMIN C) tablet Take 1 tablet by mouth daily. Reported on 03/27/2015 01/19/15  Yes Magrinat, Virgie Dad, MD  calcium  carbonate (TUMS - DOSED IN MG ELEMENTAL CALCIUM) 500 MG chewable tablet Chew 1 tablet by mouth as directed.   Yes [provider]  cholecalciferol 2000 UNITS tablet Take 1 tablet (2,000 Units total) by mouth daily. 01/19/15  Yes Magrinat, Virgie Dad, MD  famotidine (PEPCID) 40 MG tablet Take 1 tablet (40 mg total) by mouth daily. 08/15/16  Yes Little, Wenda Overland, MD  folic acid (FOLVITE) 1 MG  tablet Take 1 tablet (1 mg total) by mouth daily. 01/19/15  Yes Magrinat, Virgie Dad, MD  letrozole [facility]) 2.5 MG tablet Take 2.5 mg by mouth at bedtime. 07/22/16  Yes [provider]  loratadine-pseudoephedrine (CLARITIN-D 24-HOUR) 10-240 MG 24 hr tablet Take 1 tablet by mouth daily.   Yes [provider]  Melatonin 3 MG TABS Take 3 mg by mouth at bedtime.   Yes [provider]  RABEprazole Sodium 5 MG CPSP Take 5 mg by mouth daily. 10/08/15  Yes Magrinat, Virgie Dad, MD  saccharomyces boulardii (FLORASTOR) 250 MG capsule Take 250 mg by mouth daily.  03/23/15  Yes Magrinat, Virgie Dad, MD  ALPRAZolam Duanne Moron) 0.5 MG tablet Take 1 tablet (0.5 mg total) by mouth 2 (two) times daily as needed for anxiety. Patient not taking: Reported on 08/26/2016 03/03/16   Magrinat, Virgie Dad, MD  Alum & Mag Hydroxide-Simeth (GI COCKTAIL) SUSP suspension Take 30 mLs by mouth 3 (three) times daily as needed for indigestion. Shake well. Patient not taking: Reported on 08/26/2016 08/15/16   Little, Wenda Overland, MD  Diphenhyd-Hydrocort-Nystatin (FIRST-DUKES MOUTHWASH) SUSP 5-10 ml qid SWISH AND SPIT Patient not taking: Reported on 08/15/2016 05/09/15   Magrinat, Virgie Dad, MD  palbociclib [facility]) 75 MG capsule Take 1 capsule (75 mg total) by mouth every other day. Take whole with food. Patient not taking: Reported on 08/26/2016 07/25/16   Magrinat, Virgie Dad, MD  phenazopyridine (PYRIDIUM) 100 MG tablet Take 1 tablet (100 mg total) by mouth 3 (three) times daily as needed for pain. Patient not taking: Reported on 08/15/2016 05/16/16   Magrinat, Virgie Dad, MD  vitamin E (VITAMIN E) 400 UNIT capsule Take 1 capsule (400 Units total) by mouth 2 (two) times daily. Patient not taking: Reported on 08/15/2016 05/16/16   Kyung Rudd, MD    Current Facility-Administered Medications  Medication Dose Route Frequency Provider Last Rate Last Dose  . 0.9 %  sodium chloride infusion   Intravenous Continuous Thurnell Lose, MD 125 mL/hr at 08/26/16 0534    . albuterol (PROVENTIL) (2.5 MG/3ML) 0.083% nebulizer solution 2.5 mg  2.5 mg Nebulization Q4H PRN Thurnell Lose, MD      . bisacodyl (DULCOLAX) suppository 10 mg  10 mg Rectal Daily PRN Thurnell Lose, MD      . heparin injection 5,000 Units  5,000 Units Subcutaneous Q8H Lala Lund K, MD      . ketorolac (TORADOL) 30 MG/ML injection 30 mg  30 mg Intravenous Q6H PRN Thurnell Lose, MD      . LORazepam (ATIVAN) injection 0.5 mg  0.5 mg Intravenous Q8H PRN Thurnell Lose, MD      . ondansetron (ZOFRAN) tablet 4 mg  4 mg Oral Q6H PRN Thurnell Lose, MD       Or  . ondansetron (ZOFRAN) injection 4 mg  4 mg Intravenous Q6H PRN Thurnell Lose, MD      . piperacillin-tazobactam (ZOSYN) IVPB 3.375 g  3.375 g Intravenous Q8H Royetta Asal, [facility]  Allergies as of 08/26/2016 - Review Complete 08/26/2016  Allergen Reaction Noted  . 2nd skin quick heal Other (See Comments) 03/23/2015  . Decadron [dexamethasone] Other (See Comments) 08/30/2013  . Dilaudid [hydromorphone] Nausea And Vomiting 11/11/2014  . Enoxaparin Other (See Comments) 11/21/2013  . Fluconazole Swelling 05/06/2014  . Hydromorphone hcl Nausea And Vomiting 08/30/2013  . Morphine and related Nausea And Vomiting 11/25/2011  . Protonix [pantoprazole sodium] Other (See Comments) 11/27/2014  . Tegaderm ag mesh [silver]  04/14/2011     Review of Systems:    Constitutional: No weight loss, fever or chills Skin: No rash Cardiovascular: No chest pain Respiratory: No cough Gastrointestinal: See HPI and otherwise negative Genitourinary: No dysuria Neurological: No headache Musculoskeletal: No new muscle or joint pain Hematologic: No bleeding Psychiatric: No history of depression or anxiety   Physical Exam:  Vital signs in last 24 hours: Temp:  [98.5 F (36.9 C)] 98.5 F (36.9 C) (05/29 0220) Pulse Rate:  [98-108] 99 (05/29 0911) Resp:  [16-25] 18 (05/29 0911) BP:  (120-139)/(82-88) 131/88 (05/29 0911) SpO2:  [95 %-97 %] 97 % (05/29 0911)   General:   Pleasant jaundiced caucasian female appears to be in NAD, Well developed, Well nourished, alert and cooperative Head:  Normocephalic and atraumatic. Eyes:   PEERL, EOMI. icteric. Conjunctiva pink. Ears:  Normal auditory acuity. Neck:  Supple Throat: Oral cavity and pharynx without inflammation, swelling or lesion.  Lungs: Respirations even and unlabored. Lungs clear to auscultation bilaterally.   No wheezes, crackles, or rhonchi. Pain elicited with deep inhalation Heart: Normal S1, S2. No MRG. Regular rate and rhythm. No peripheral edema, cyanosis or pallor.  Abdomen:  Soft, nondistended,marked ttp to light palpation in RUQ Normal bowel sounds. No appreciable masses or hepatomegaly. Rectal:  Not performed.  Msk:  Symmetrical without gross deformities. Peripheral pulses intact.  Extremities:  Without edema, no deformity or joint abnormality.  Neurologic:  Alert and  oriented x4;  grossly normal neurologically.  Skin:   Dry and intact without significant lesions or rashes. Psychiatric:Demonstrates good judgement and reason without abnormal affect or behaviors.   LAB RESULTS:  Recent Labs  08/26/16 0300 08/26/16 0359  WBC 7.4  --   HGB 11.6* 12.2  HCT 33.0* 36.0  PLT 542*  --    BMET  Recent Labs  08/26/16 0300 08/26/16 0359  NA 129* 131*  K 3.8 3.9  CL 98* 98*  CO2 22  --   GLUCOSE 123* 121*  BUN 6 4*  CREATININE 0.34* 0.50  CALCIUM 8.0*  --    LFT  Recent Labs  08/26/16 0300  PROT 6.8  ALBUMIN 3.2*  AST 306*  ALT 370*  ALKPHOS 542*  BILITOT 12.5*   STUDIES: US Abdomen Complete  Result Date: 08/26/2016 CLINICAL DATA:  RIGHT upper quadrant pain beginning yesterday. Jaundice. History of metastatic breast cancer with known liver metastasis. EXAM: ABDOMEN ULTRASOUND COMPLETE COMPARISON:  Abdominal ultrasound Aug 15, 2016 and MRI of the abdomen July 24, 2016 FINDINGS:  Gallbladder: 1.9 cm echogenic gallstone with acoustic shadowing surrounded by mildly echogenic sludge with increased through transmission. Mild gallbladder wall thickening without pericholecystic fluid. Sonographic Murphy's sign elicited. Common bile duct: Diameter: 5 mm Liver: Multiple heterogeneous liver mass is consistent with patient's known metastasis. Porta hepatis lymphadenopathy present though, better characterized on prior MRI. IVC: No abnormality visualized. Pancreas: Visualized portion unremarkable. Spleen: Size and appearance within normal limits. Right Kidney: Length: 11.5 cm. Echogenicity within normal limits. No mass or hydronephrosis visualized. Left  Kidney: Length: 11.8 cm. Echogenicity within normal limits. No mass or hydronephrosis visualized. Abdominal aorta: No aneurysm visualized. Other findings: None. IMPRESSION: Cholelithiasis and sonographic findings of acute cholecystitis. Numerous hepatic and nodal metastasis. Electronically Signed   By: Elon Alas M.D.   On: 08/26/2016 06:36     PREVIOUS ENDOSCOPIES:            See HPI   Impression / Plan:   Impression: 1. Elevated LFT's: Currently AST 306, ALT 370, alkaline phosphatase 542, total bili 12.5, per review of chart patient's transaminases were even higher than this within the past 1-2 weeks, they have started to decline whereas bilirubin has been increasing, this is most consistent with a drug-induced liver injury from patient's recentl Keytruda infusion 2 weeks ago in combination with her other cancer treatments, though there is a vague concern for possible gallbladder disease as there was findings of a mild gallbladder wall thickening on recent ultrasound, though likely this is just a result of liver inflammation, HIDA scan ordered by surgical team to consider gallbladder inflammation due to now right upper quadrant pain 2. RUQ pain: See above  Plan: 1. Will await results of Hida scan as ordered by sx team 2. Did  discuss possible empiric abx with patient today to help prevent any possible cholecystitis, she would like to await results of imaging 3. Continue other supportive measures 4. Dr. Loletha Carrow had long discussion with the patient regarding likely etiology of her elevated liver enzymes, jaundice and right upper quadrant pain. All of her questions were answered. 5. Please await any further recommendations after results of HIDA scan  Thank you for your kind consultation, we will continue to follow.  [name] [name]  08/26/2016, 10:35 AM Pager #: [phone]  I have reviewed the entire case in detail with the above APP and discussed the plan in detail.Anderson Malta acted as my scribe while we saw the patient together this AM.  Therefore, I agree with the diagnoses recorded above. In addition,  I have personally interviewed and examined the patient and have personally reviewed any abdominal/pelvic CT scan images.  My additional thoughts are as follows:  Jaundice from tumor burden in liver and superimposed drug induced liver injury, most likely from [facility] lately.  It is following the typical pattern for DILI, with delayed and prolonged rise of bilirubin after peak of transaminases.  She does not have extrahepatic biliary ductal obstruction.  Agree with surgery - very difficult to tell if acute cholecystitis on exam or with US imaging.  Lack of fever or leukocytosis somewhat reassuring.  HIDA very likely to be limited by intrahepatic cholestasis.  IV antibiotics in case she has cholecystitis.  I will leave decision regarding need for cholecystostomy tube to surgery. We will follow. Nelida Meuse III Pager [phone]  Mon-Fri 8a-5p [phone] after 5p, weekends, holidays

## 2016-08-26 NOTE — Progress Notes (Signed)
Patient is refusing to have HIV labs drawn.

## 2016-08-26 NOTE — Progress Notes (Signed)
Pharmacy Antibiotic Note  [name] [name] is a 56 y.o. female admitted on 08/26/2016 with intra-abdominal infection.  Pharmacy has been consulted for Zosyn dosing.  Plan: Zosyn 3.375g IV q8h (4 hour infusion).    Dosage will likely remain stable at above dosage and need for further dosage adjustment appears unlikely at present.    Will sign off at this time.  Please reconsult if a change in clinical status warrants re-evaluation of dosage.     Temp (24hrs), Avg:98.5 F (36.9 C), Min:98.5 F (36.9 C), Max:98.5 F (36.9 C)   Recent Labs Lab 08/19/16 1047 08/19/16 1047 08/26/16 0300 08/26/16 0359  WBC 3.9  --  7.4  --   CREATININE  --  0.7 0.34* 0.50    Estimated Creatinine Clearance: 76.7 mL/min (by C-G formula based on SCr of 0.5 mg/dL).    Allergies  Allergen Reactions  . 2nd Skin Quick Heal Other (See Comments)    Other Reaction: Skin peels  . Decadron [Dexamethasone] Other (See Comments)    Patient does not tolerate steroids.   . Dilaudid [Hydromorphone] Nausea And Vomiting  . Enoxaparin Other (See Comments)    unknown  . Fluconazole Swelling    Liver toxicity  . Hydromorphone Hcl Nausea And Vomiting  . Morphine And Related Nausea And Vomiting  . Protonix [Pantoprazole Sodium] Other (See Comments)    Patient reports it caused thrush.  . Tegaderm Ag Mesh [Silver]     Antimicrobials this admission: 5/29 Vancomycin x 1 in ED   5/29 Zosyn >>   Dose adjustments this admission: ----  Microbiology results: --- Thank you for allowing pharmacy to be a part of this patient's care.   Royetta Asal, PharmD, BCPS Pager [phone] 08/26/2016 8:08 AM

## 2016-08-26 NOTE — Progress Notes (Signed)
I have seen the HIDA scan results.  I believe this is due to intrahepatic cholestasis as noted in my consult note of earlier today, rather than from an extrahepatic biliary obstruction.  As such, my recommendation is not to order an MRCP on this patient.   Wilfrid Lund, MD [facility]

## 2016-08-26 NOTE — Consult Note (Signed)
[facility] Surgery Consult Note  [name] [name] [birth year]  [id number].    Requesting MD: Randal Buba, MD Chief Complaint/Reason for Consult: RUQ pain, jaundice  HPI:  [name] [name] is a 56 y.o. 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 Patient reports generalized abdominal pain that began Friday 5/25 which she attributes to gas. She states that constant, sharp, stabbing, RUQ pain began acutely at 5 PM yesterday, about 3 hours after consuming chicken broth soup. She denies similar pain in the past. Pain exacerbated by deep inspiration and coughing. She denies diarrhea but endorses white stools and jaundice since 5/15. She is currently undergoing chemotherapy with Beryle Flock (last dose May 1) and is followed by Dr. Jana Hakim.  ED workup: WBC WNL, AST 306, ALT 370, Alk Phos 542, T. Bilirubin 12.5, Creatinine 0.34, Sodium 129 RUQ U/S - cholelithiasis with sludge, mild gallbladder wall thickening, no pericholecystic fluiid, positive Murphy's. CBD 58m. Multiple heterogeneous liver masses, porta hepatis lymphadenopathy.   ROS: Review of Systems  Constitutional: Negative for chills and fever.  Gastrointestinal: Positive for abdominal pain and heartburn.       White stools x 2 weeks.   Musculoskeletal: Positive for back pain (right, upper back).  All other systems reviewed and are negative.   Family History  Problem Relation Age of Onset  . COPD Mother   . Breast cancer Sister 427   Past Medical History:  Diagnosis Date  . [place]  . Breast cancer ([place] dx[place] 2005/2011  . [place]ipheral vascular disease ([place] 02/[place]   blood clot related to porta cath  . PONV (postoperative nausea and vomiting)   . S/P radiation therapy 07/17/2014 through 08/02/2014    Left  mediastinum, left seventh rib 3250 cGy in 13 sessions   . S/P radiation therapy 12/11/2014 through 12/22/2014    Left parietal calvarium 2400 cGy in 8 sessions   . [place]   Isolated incident.    Past Surgical History:  Procedure Laterality Date  . AXILLARY LYMPH NODE DISSECTION  Dec. 2011  . BREAST LUMPECTOMY  2005  . IR GENERIC HISTORICAL  05/21/2016   IR RADIOLOGIST EVAL & MGMT 05/21/2016 JSandi Mariscal MD [facility] INTERV RAD  . MEDIASTINOTOMY CHAMBERLAIN MCNEIL Left 06/02/2013   Procedure: MEDIASTINOTOMY CHAMBERLAIN MCNEIL;  Surgeon: SMelrose Nakayama MD;  [place]  Service: Thoracic;  Laterality: Left;  LEFT ANTERIOR MEDIASTINOTOMY   . PORTACATH PLACEMENT  12/11  . removal portacath      Social History:  reports that she has never smoked. She has never used smokeless tobacco. She reports that she does not drink alcohol or use drugs.  Allergies:  Allergies  Allergen Reactions  . 2nd Skin Quick Heal Other (See Comments)    Other Reaction: Skin peels  . Decadron [Dexamethasone] Other (See Comments)    Patient does not tolerate steroids.   . Dilaudid [Hydromorphone] Nausea And Vomiting  . Enoxaparin Other (See Comments)    unknown  . Fluconazole Swelling    Liver toxicity  . Hydromorphone Hcl Nausea And Vomiting  . Morphine And Related Nausea And Vomiting  . Protonix [Pantoprazole Sodium] Other (See Comments)    Patient reports it caused thrush.  . Tegaderm Ag Mesh [Silver]      (Not in a hospital admission)  Blood pressure 120/83, pulse (!) 107, temperature 98.5 F (  36.9 C), temperature source Oral, resp. rate (!) 25, SpO2 95 %. Physical Exam: Physical Exam  Constitutional: She is oriented to person, place, and time. She appears well-developed. No distress.  HENT:  Head: Normocephalic and atraumatic.  Mouth/Throat: No oropharyngeal exudate.  Eyes:  EOM are normal. Pupils are equal, round, and reactive to light. Scleral icterus is present.  Neck: Normal range of motion. Neck supple. No tracheal deviation present.  Cardiovascular: Normal rate, regular rhythm, normal heart sounds and intact distal pulses.  Exam reveals no gallop and no friction rub.   No murmur heard. Pulmonary/Chest: Effort normal and breath sounds normal. No stridor. No respiratory distress. She has no wheezes. She has no rales. She exhibits no tenderness.  Abdominal: Soft. She exhibits distension (mild ). She exhibits no mass. There is tenderness (RUQ without guarding or peritonitis ). There is no rebound. No hernia.  Hypoactive BS  Musculoskeletal: Normal range of motion. She exhibits no tenderness or deformity.  Neurological: She is alert and oriented to person, place, and time. No sensory deficit.  Skin: Skin is warm and dry. She is not diaphoretic.  Jaundice present  Psychiatric: Her behavior is normal. Thought content normal.   Results for orders placed or performed during the hospital encounter of 08/26/16 (from the past 48 hour(s))  CBC with Differential/Platelet     Status: Abnormal   Collection Time: 08/26/16  3:00 AM  Result Value Ref Range   WBC 7.4 4.0 - 10.5 K/uL   RBC 3.22 (L) 3.87 - 5.11 MIL/uL   Hemoglobin 11.6 (L) 12.0 - 15.0 g/dL   HCT 33.0 (L) 36.0 - 46.0 %   MCV 102.5 (H) 78.0 - 100.0 fL   MCH 36.0 (H) 26.0 - 34.0 pg   MCHC 35.2 30.0 - 36.0 g/dL   RDW 14.1 11.5 - 15.5 %   Platelets 542 (H) 150 - 400 K/uL   Neutrophils Relative % 80 %   Neutro Abs 6.0 1.7 - 7.7 K/uL   Lymphocytes Relative 5 %   Lymphs Abs 0.4 (L) 0.7 - 4.0 K/uL   Monocytes Relative 13 %   Monocytes Absolute 0.9 0.1 - 1.0 K/uL   Eosinophils Relative 1 %   Eosinophils Absolute 0.1 0.0 - 0.7 K/uL   Basophils Relative 1 %   Basophils Absolute 0.1 0.0 - 0.1 K/uL  Comprehensive metabolic panel     Status: Abnormal   Collection Time: 08/26/16  3:00 AM  Result Value Ref Range    Sodium 129 (L) 135 - 145 mmol/L   Potassium 3.8 3.5 - 5.1 mmol/L   Chloride 98 (L) 101 - 111 mmol/L   CO2 22 22 - 32 mmol/L   Glucose, Bld 123 (H) 65 - 99 mg/dL   BUN 6 6 - 20 mg/dL   Creatinine, Ser 0.34 (L) 0.44 - 1.00 mg/dL   Calcium 8.0 (L) 8.9 - 10.3 mg/dL   Total Protein 6.8 6.5 - 8.1 g/dL   Albumin 3.2 (L) 3.5 - 5.0 g/dL   AST 306 (H) 15 - 41 U/L   ALT 370 (H) 14 - 54 U/L   Alkaline Phosphatase 542 (H) 38 - 126 U/L   Total Bilirubin 12.5 (H) 0.3 - 1.2 mg/dL   GFR calc non Af Amer >60 >60 mL/min   GFR calc Af Amer >60 >60 mL/min    Comment: (NOTE) The eGFR has been calculated using the CKD EPI equation. This calculation has not been validated in all clinical situations. eGFR's persistently <60 mL/min  signify possible Chronic Kidney Disease.    Anion gap 9 5 - 15  Lipase, blood     Status: None   Collection Time: 08/26/16  3:00 AM  Result Value Ref Range   Lipase 29 11 - 51 U/L  Urinalysis, Routine w reflex microscopic     Status: Abnormal   Collection Time: 08/26/16  3:34 AM  Result Value Ref Range   Color, Urine AMBER (A) YELLOW    Comment: BIOCHEMICALS MAY BE AFFECTED BY COLOR   APPearance CLEAR CLEAR   Specific Gravity, Urine 1.015 1.005 - 1.030   pH 5.0 5.0 - 8.0   Glucose, UA NEGATIVE NEGATIVE mg/dL   Hgb urine dipstick SMALL (A) NEGATIVE   Bilirubin Urine MODERATE (A) NEGATIVE   Ketones, ur 20 (A) NEGATIVE mg/dL   Protein, ur NEGATIVE NEGATIVE mg/dL   Nitrite NEGATIVE NEGATIVE   Leukocytes, UA NEGATIVE NEGATIVE   RBC / HPF 0-5 0 - 5 RBC/hpf   WBC, UA 0-5 0 - 5 WBC/hpf   Bacteria, UA RARE (A) NONE SEEN   Squamous Epithelial / LPF 0-5 (A) NONE SEEN   Mucous PRESENT    Hyaline Casts, UA PRESENT   I-Stat Chem 8, ED     Status: Abnormal   Collection Time: 08/26/16  3:59 AM  Result Value Ref Range   Sodium 131 (L) 135 - 145 mmol/L   Potassium 3.9 3.5 - 5.1 mmol/L   Chloride 98 (L) 101 - 111 mmol/L   BUN 4 (L) 6 - 20 mg/dL   Creatinine, Ser 0.50 0.44 - 1.00  mg/dL   Glucose, Bld 121 (H) 65 - 99 mg/dL   Calcium, Ion 1.02 (L) 1.15 - 1.40 mmol/L   TCO2 25 0 - 100 mmol/L   Hemoglobin 12.2 12.0 - 15.0 g/dL   HCT 36.0 36.0 - 46.0 %   *Note: Due to a large number of results and/or encounters for the requested time period, some results have not been displayed. A complete set of results can be found in Results Review.   US Abdomen Complete  Result Date: 08/26/2016 CLINICAL DATA:  RIGHT upper quadrant pain beginning yesterday. Jaundice. History of metastatic breast cancer with known liver metastasis. EXAM: ABDOMEN ULTRASOUND COMPLETE COMPARISON:  Abdominal ultrasound Aug 15, 2016 and MRI of the abdomen July 24, 2016 FINDINGS: Gallbladder: 1.9 cm echogenic gallstone with acoustic shadowing surrounded by mildly echogenic sludge with increased through transmission. Mild gallbladder wall thickening without pericholecystic fluid. Sonographic Murphy's sign elicited. Common bile duct: Diameter: 5 mm Liver: Multiple heterogeneous liver mass is consistent with patient's known metastasis. Porta hepatis lymphadenopathy present though, better characterized on prior MRI. IVC: No abnormality visualized. Pancreas: Visualized portion unremarkable. Spleen: Size and appearance within normal limits. Right Kidney: Length: 11.5 cm. Echogenicity within normal limits. No mass or hydronephrosis visualized. Left Kidney: Length: 11.8 cm. Echogenicity within normal limits. No mass or hydronephrosis visualized. Abdominal aorta: No aneurysm visualized. Other findings: None. IMPRESSION: Cholelithiasis and sonographic findings of acute cholecystitis. Numerous hepatic and nodal metastasis. Electronically Signed   By: Elon Alas M.D.   On: 08/26/2016 06:36   Assessment/Plan Cholelithiasis  RUQ pain  Jaundice  Hyperbilirubinemia  Elevated LFT's  Breast Cancer with metastasis - appreciate Dr. Jana Hakim recs  Based on exam and current imaging/labs, unable to determine if patients RUQ  pain and gallbladder wall thickening are secondary to underlying hepatocellular dysfunction/reactive to cancer/treatment or if early cholecystitis is present. I will order a HIDA scan to better evaluate possible obstruction  of cystic duct. Recommend IV Zosyn. Should the HIDA scan return positive, percutaneous cholecystostomy tube placement would be considered, however patient is overall very resistant to a long-term drain.  I recommend Oncology and GI consults.  Jill Alexanders, [facility] Surgery 08/26/2016, 7:35 AM Pager: [phone] Consults: [phone] Mon-Fri 7:00 am-4:30 pm Sat-Sun 7:00 am-11:30 am

## 2016-08-26 NOTE — H&P (Signed)
[facility] H&P   Patient Demographics:    [name] [name], is a 56 y.o. female  MRN: [id number]   DOB - 1960/08/29  Admit Date - 08/26/2016  Outpatient Primary MD for the patient is Magrinat, Virgie Dad, MD  Outpatient Specialists: Dr. Jana Hakim   Patient coming from: Home  Chief Complaint  Patient presents with  . Abdominal Pain      HPI:    [name] [name]  is a 56 y.o. 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 Jana Hakim, GERD, anxiety who comes in with one-day history of sharp right upper quadrant abdominal pain which is nonradiating, associated with mild nausea, no aggravating or relieving factors along with elevated LFTs. She was recently given Keytruda chemotherapy and elevated LFTs could be a potential side effect however she also has right upper quadrant abdominal pain, she came to the ER where workup showed sonographic evidence of Murphy's sign on ultrasound, elevated LFTs but no fever or leukocytosis. General surgery was called along with oncology and I was requested to admit.  Patient besides abdominal pain and icterus has no complaints. She does understand that her cancer now is being resistant to medications but would like to discuss her options with Dr. Jana Hakim, she is currently not wanting any antibiotics until she talks to Dr. Jana Hakim as well.    Review of systems:    In addition to the HPI above,  No Fever-chills, No Headache, No changes with Vision or hearing, No problems swallowing food or Liquids, No Chest pain, Cough or Shortness of Breath, GI symptoms as above, No Blood in  stool or Urine, No dysuria, No new skin rashes or bruises, No new joints pains-aches,  No new weakness, tingling, numbness in any extremity, No recent weight gain or loss, No polyuria, polydypsia or polyphagia, No significant Mental Stressors.  A full 10 point Review of Systems was done, except as stated above, all other Review of Systems were negative.   With Past History of the following :    Past Medical History:  Diagnosis Date  . Bone metastases ([place]) dx'd 05/2014  . Breast cancer ([place]) dx'd 2005/2011  . Peripheral vascular disease ([place]) 02/2010   blood clot related to porta cath  .  PONV (postoperative nausea and vomiting)   . S/P radiation therapy 07/17/2014 through 08/02/2014    Left mediastinum, left seventh rib 3250 cGy in 13 sessions   . S/P radiation therapy 12/11/2014 through 12/22/2014    Left parietal calvarium 2400 cGy in 8 sessions   . Seizures ([place]) 2010   Isolated incident.      Past Surgical History:  Procedure Laterality Date  . AXILLARY LYMPH NODE DISSECTION  Dec. 2011  . BREAST LUMPECTOMY  2005  . IR GENERIC HISTORICAL  05/21/2016   IR RADIOLOGIST EVAL & MGMT 05/21/2016 Sandi Mariscal, MD [facility] INTERV RAD  . MEDIASTINOTOMY CHAMBERLAIN MCNEIL Left 06/02/2013   Procedure: MEDIASTINOTOMY CHAMBERLAIN MCNEIL;  Surgeon: [name], MD;  Location: [place];  Service: Thoracic;  Laterality: Left;  LEFT ANTERIOR MEDIASTINOTOMY   . PORTACATH PLACEMENT  12/11  . removal portacath        Social History:     Social History  Substance Use Topics  . Smoking status: Never Smoker  . Smokeless tobacco: Never Used  . Alcohol use No         Family History :     Family History  Problem Relation Age of Onset  . COPD Mother   . Breast cancer Sister 68       Home Medications:   Prior to Admission  medications   Medication Sig Start Date End Date Taking? Authorizing Provider  B Complex-C (B-COMPLEX WITH VITAMIN C) tablet Take 1 tablet by mouth daily. Reported on 03/27/2015 01/19/15  Yes Magrinat, Virgie Dad, MD  calcium carbonate (TUMS - DOSED IN MG ELEMENTAL CALCIUM) 500 MG chewable tablet Chew 1 tablet by mouth as directed.   Yes [provider]  cholecalciferol 2000 UNITS tablet Take 1 tablet (2,000 Units total) by mouth daily. 01/19/15  Yes Magrinat, Virgie Dad, MD  famotidine (PEPCID) 40 MG tablet Take 1 tablet (40 mg total) by mouth daily. 08/15/16  Yes Little, Wenda Overland, MD  folic acid (FOLVITE) 1 MG tablet Take 1 tablet (1 mg total) by mouth daily. 01/19/15  Yes Magrinat, Virgie Dad, MD  letrozole [facility]) 2.5 MG tablet Take 2.5 mg by mouth at bedtime. 07/22/16  Yes [provider]  loratadine-pseudoephedrine (CLARITIN-D 24-HOUR) 10-240 MG 24 hr tablet Take 1 tablet by mouth daily.   Yes [provider]  Melatonin 3 MG TABS Take 3 mg by mouth at bedtime.   Yes [provider]  RABEprazole Sodium 5 MG CPSP Take 5 mg by mouth daily. 10/08/15  Yes Magrinat, Virgie Dad, MD  saccharomyces boulardii (FLORASTOR) 250 MG capsule Take 250 mg by mouth daily.  03/23/15  Yes Magrinat, Virgie Dad, MD  ALPRAZolam Duanne Moron) 0.5 MG tablet Take 1 tablet (0.5 mg total) by mouth 2 (two) times daily as needed for anxiety. Patient not taking: Reported on 08/26/2016 03/03/16   Magrinat, Virgie Dad, MD  Alum & Mag Hydroxide-Simeth (GI COCKTAIL) SUSP suspension Take 30 mLs by mouth 3 (three) times daily as needed for indigestion. Shake well. Patient not taking: Reported on 08/26/2016 08/15/16   Little, Wenda Overland, MD  Diphenhyd-Hydrocort-Nystatin (FIRST-DUKES MOUTHWASH) SUSP 5-10 ml qid SWISH AND SPIT Patient not taking: Reported on 08/15/2016 05/09/15   Magrinat, Virgie Dad, MD  palbociclib [facility]) 75 MG capsule Take 1 capsule (75 mg total) by mouth every other day. Take whole with  food. Patient not taking: Reported on 08/26/2016 07/25/16   Magrinat, Virgie Dad, MD  phenazopyridine (PYRIDIUM) 100 MG tablet Take 1 tablet (  100 mg total) by mouth 3 (three) times daily as needed for pain. Patient not taking: Reported on 08/15/2016 05/16/16   Magrinat, Virgie Dad, MD  vitamin E (VITAMIN E) 400 UNIT capsule Take 1 capsule (400 Units total) by mouth 2 (two) times daily. Patient not taking: Reported on 08/15/2016 05/16/16   Kyung Rudd, MD     Allergies:     Allergies  Allergen Reactions  . 2nd Skin Quick Heal Other (See Comments)    Other Reaction: Skin peels  . Decadron [Dexamethasone] Other (See Comments)    Patient does not tolerate steroids.   . Dilaudid [Hydromorphone] Nausea And Vomiting  . Enoxaparin Other (See Comments)    unknown  . Fluconazole Swelling    Liver toxicity  . Hydromorphone Hcl Nausea And Vomiting  . Morphine And Related Nausea And Vomiting  . Protonix [Pantoprazole Sodium] Other (See Comments)    Patient reports it caused thrush.  . Tegaderm Ag Mesh [Silver]      Physical Exam:   Vitals  Blood pressure 123/82, pulse 98, temperature 98.5 F (36.9 C), temperature source Oral, resp. rate 18, SpO2 97 %.   1. General Anxious middle-aged white female with obvious skin and scleral icterus lying in hospital bed somewhat anxious  2. Anxious affect and insight, Not Suicidal or Homicidal, Awake Alert, Oriented X 3.  3. No F.N deficits, ALL C.Nerves Intact, Strength 5/5 all 4 extremities, Sensation intact all 4 extremities, Plantars down going.  4. Ears and Eyes appear Normal, Conjunctivae clear, PERRLA. Moist Oral Mucosa.  5. Supple Neck, No JVD, No cervical lymphadenopathy appriciated, No Carotid Bruits.  6. Symmetrical Chest wall movement, Good air movement bilaterally, CTAB.  7. RRR, No Gallops, Rubs or Murmurs, No Parasternal Heave.  8. Positive Bowel Sounds, Abdomen Soft, positive right upper quadrant tenderness, No organomegaly  appriciated,No rebound -guarding or rigidity.  9.  No Cyanosis, Normal Skin Turgor, No Skin Rash or Bruise.  10. Good muscle tone,  joints appear normal , no effusions, Normal ROM.  11. No Palpable Lymph Nodes in Neck or Axillae      Data Review:    CBC  Recent Labs Lab 08/19/16 1047 08/26/16 0300 08/26/16 0359  WBC 3.9 7.4  --   HGB 12.2 11.6* 12.2  HCT 34.6* 33.0* 36.0  PLT 270 542*  --   MCV 104.5* 102.5*  --   MCH 36.9* 36.0*  --   MCHC 35.3 35.2  --   RDW 13.1 14.1  --   LYMPHSABS 0.5* 0.4*  --   MONOABS 0.4 0.9  --   EOSABS 0.1 0.1  --   BASOSABS 0.1 0.1  --    ------------------------------------------------------------------------------------------------------------------  Chemistries   Recent Labs Lab 08/19/16 1047 08/26/16 0300 08/26/16 0359  NA 135* 129* 131*  K 4.1 3.8 3.9  CL  --  98* 98*  CO2 23 22  --   GLUCOSE 127 123* 121*  BUN 5.8* 6 4*  CREATININE 0.7 0.34* 0.50  CALCIUM 8.4 8.0*  --   AST 334* 306*  --   ALT 562* 370*  --   ALKPHOS 404* 542*  --   BILITOT 5.23* 12.5*  --    ------------------------------------------------------------------------------------------------------------------ estimated creatinine clearance is 76.7 mL/min (by C-G formula based on SCr of 0.5 mg/dL). ------------------------------------------------------------------------------------------------------------------ No results for input(s): TSH, T4TOTAL, T3FREE, THYROIDAB in the last 72 hours.  Invalid input(s): FREET3  Coagulation profile No results for input(s): INR, PROTIME in the last 168 hours. ------------------------------------------------------------------------------------------------------------------- No  results for input(s): DDIMER in the last 72 hours. -------------------------------------------------------------------------------------------------------------------  Cardiac Enzymes No results for input(s): CKMB, TROPONINI, MYOGLOBIN in the  last 168 hours.  Invalid input(s): CK ------------------------------------------------------------------------------------------------------------------ No results found for: BNP   ---------------------------------------------------------------------------------------------------------------  Urinalysis    Component Value Date/Time   COLORURINE AMBER (A) 08/26/2016 0334   APPEARANCEUR CLEAR 08/26/2016 0334   LABSPEC 1.015 08/26/2016 0334   LABSPEC 1.005 05/16/2016 1201   PHURINE 5.0 08/26/2016 0334   GLUCOSEU NEGATIVE 08/26/2016 0334   GLUCOSEU Negative 05/16/2016 1201   HGBUR SMALL (A) 08/26/2016 0334   BILIRUBINUR MODERATE (A) 08/26/2016 0334   BILIRUBINUR Negative 05/16/2016 1201   KETONESUR 20 (A) 08/26/2016 0334   PROTEINUR NEGATIVE 08/26/2016 0334   UROBILINOGEN 0.2 05/16/2016 1201   NITRITE NEGATIVE 08/26/2016 0334   LEUKOCYTESUR NEGATIVE 08/26/2016 0334   LEUKOCYTESUR Small 05/16/2016 1201    ----------------------------------------------------------------------------------------------------------------   Imaging Results:    US Abdomen Complete  Result Date: 08/26/2016 CLINICAL DATA:  RIGHT upper quadrant pain beginning yesterday. Jaundice. History of metastatic breast cancer with known liver metastasis. EXAM: ABDOMEN ULTRASOUND COMPLETE COMPARISON:  Abdominal ultrasound Aug 15, 2016 and MRI of the abdomen July 24, 2016 FINDINGS: Gallbladder: 1.9 cm echogenic gallstone with acoustic shadowing surrounded by mildly echogenic sludge with increased through transmission. Mild gallbladder wall thickening without pericholecystic fluid. Sonographic Murphy's sign elicited. Common bile duct: Diameter: 5 mm Liver: Multiple heterogeneous liver mass is consistent with patient's known metastasis. Porta hepatis lymphadenopathy present though, better characterized on prior MRI. IVC: No abnormality visualized. Pancreas: Visualized portion unremarkable. Spleen: Size and appearance within  normal limits. Right Kidney: Length: 11.5 cm. Echogenicity within normal limits. No mass or hydronephrosis visualized. Left Kidney: Length: 11.8 cm. Echogenicity within normal limits. No mass or hydronephrosis visualized. Abdominal aorta: No aneurysm visualized. Other findings: None. IMPRESSION: Cholelithiasis and sonographic findings of acute cholecystitis. Numerous hepatic and nodal metastasis. Electronically Signed   By: Elon Alas M.D.   On: 08/26/2016 06:36    My personal review of EKG: Rhythm NSR,  no Acute ST changes   Assessment & Plan:     1. Right upper quadrant abdominal pain with elevated LFTs. Ultrasound suspicious for acute cholecystitis, she does have gallstones. Elevated LFTs could be from [place], she does not have fever or leukocytosis. She does have history of numerous liver metastases. At this time she will be nothing by mouth, IV fluids and IV antibiotics if she agrees to, general surgery on board, defer further workup to Gen. surgery may require HIDA scan. Continue supportive care for now she prefers Toradol which will be given. She prefers minimal medications.  2. Metastatic stage IV breast cancer with metastases to skull, left pleura and liver. Defer management to Dr. Jana Hakim will be seeing the patient shortly. Patient at this time will not like to discuss palliative options.  3. Anxiety. As needed Ativan.   DVT Prophylaxis Heparin   AM Labs Ordered, also please review Full Orders  Family Communication: Admission, patients condition and plan of care including tests being ordered have been discussed with the patient and husband who indicate understanding and agree with the plan and Code Status.  Code Status Full  Likely DC to  TBD  Condition GUARDED    Consults called: [facility], Oncology    Admission status: Inpt    Time spent in minutes : 35   Lala Lund M.D on 08/26/2016 at 8:03 AM  Between 7am to 7pm - Pager - [phone] ( page via [place], text  pages only, please mention full 10 digit call back number).  After 7pm go to [email or web address] - password [facility]  [facility] - Office  [phone]

## 2016-08-26 NOTE — ED Notes (Signed)
Consulting MD at bedside

## 2016-08-26 NOTE — ED Notes (Signed)
Will transport pt when consulting MD, who is still at bedside, is finished with pt.

## 2016-08-26 NOTE — ED Notes (Signed)
Bed: WA09 Expected date: 08/26/16 Expected time:  Means of arrival:  Comments: EMS

## 2016-08-26 NOTE — ED Notes (Signed)
Admitting MD at bedside.

## 2016-08-26 NOTE — Progress Notes (Signed)
[name] [name]   DOB:1960/04/17   IR#:[id number]   JKK#:[id number]  Subjective: [name] [name] was upset because she felt a team member was "pushing" palliative care on her without knowing her overall situation; she is very anxious about access, which has in fact been very difficult and has required multiple attempts prior to procedures); her pain is controlled on toradol; she was refusing anticoagulation but we discussed that as well and she now agrees. She is hungry and thirsty. Husband in room  Objective: middle aged White woman examined in bed Vitals:   08/26/16 0911 08/26/16 1039  BP: 131/88 (!) 131/93  Pulse: 99 100  Resp: 18 18  Temp:  98.5 F (36.9 C)    There is no height or weight on file to calculate BMI.  Intake/Output Summary (Last 24 hours) at 08/26/16 1638 Last data filed at 08/26/16 1119  Gross per 24 hour  Intake                0 ml  Output              600 ml  Net             -600 ml     Sclerae mildly icteric  Oropharynx clear, slightly dry  No cervical or supraclavicular adenopathy  Lungs no rales or wheezes--auscultated anterolaterally  Heart regular rate and rhythm  Abdomen soft, +BS, cannot reproduce Murphy's  Neuro nonfocal, A&O x3, anxious affect  Breast exam: deferred  CBG (last 3)  No results for input(s): GLUCAP in the last 72 hours.   Labs:  Lab Results  Component Value Date   WBC 7.4 08/26/2016   HGB 12.2 08/26/2016   HCT 36.0 08/26/2016   MCV 102.5 (H) 08/26/2016   PLT 542 (H) 08/26/2016   NEUTROABS 6.0 08/26/2016    @LASTCHEMISTRY @  Urine Studies No results for input(s): UHGB, CRYS in the last 72 hours.  Invalid input(s): UACOL, UAPR, USPG, UPH, UTP, UGL, UKET, UBIL, UNIT, UROB, ULEU, UEPI, UWBC, URBC, UBAC, CAST, UCOM, BILUA  Basic Metabolic Panel:  Recent Labs Lab 08/26/16 0300 08/26/16 0359  NA 129* 131*  K 3.8 3.9  CL 98* 98*  CO2 22  --   GLUCOSE 123* 121*  BUN 6 4*  CREATININE 0.34* 0.50  CALCIUM 8.0*  --     GFR Estimated Creatinine Clearance: 76.7 mL/min (by C-G formula based on SCr of 0.5 mg/dL). Liver Function Tests:  Recent Labs Lab 08/26/16 0300  AST 306*  ALT 370*  ALKPHOS 542*  BILITOT 12.5*  PROT 6.8  ALBUMIN 3.2*    Recent Labs Lab 08/26/16 0300  LIPASE 29   No results for input(s): AMMONIA in the last 168 hours. Coagulation profile No results for input(s): INR, PROTIME in the last 168 hours.  CBC:  Recent Labs Lab 08/26/16 0300 08/26/16 0359  WBC 7.4  --   NEUTROABS 6.0  --   HGB 11.6* 12.2  HCT 33.0* 36.0  MCV 102.5*  --   PLT 542*  --    Cardiac Enzymes: No results for input(s): CKTOTAL, CKMB, CKMBINDEX, TROPONINI in the last 168 hours. BNP: Invalid input(s): POCBNP CBG: No results for input(s): GLUCAP in the last 168 hours. D-Dimer No results for input(s): DDIMER in the last 72 hours. Hgb A1c No results for input(s): HGBA1C in the last 72 hours. Lipid Profile No results for input(s): CHOL, HDL, LDLCALC, TRIG, CHOLHDL, LDLDIRECT in the last 72 hours. Thyroid function studies No results for input(s):  TSH, T4TOTAL, T3FREE, THYROIDAB in the last 72 hours.  Invalid input(s): FREET3 Anemia work up No results for input(s): VITAMINB12, FOLATE, FERRITIN, TIBC, IRON, RETICCTPCT in the last 72 hours. Microbiology No results found for this or any previous visit (from the past 240 hour(s)).    Studies:  Nm Hepatobiliary Liver Func  Result Date: 08/26/2016 CLINICAL DATA:  Right upper quadrant pain EXAM: NUCLEAR MEDICINE HEPATOBILIARY IMAGING VIEWS: Anterior right upper quadrant RADIOPHARMACEUTICALS:  7.9 mCi Tc-64m Choletec IV COMPARISON:  Ultrasound right upper quadrant Aug 15, 2016 FINDINGS: Images were obtained serially over a 3 hour time span. Morphine was not administered due to history of allergic reaction to morphine. Liver uptake of radiotracer is inhomogeneous consistent with known liver metastases. Over a 3 hour time span, there is no  appreciable visualization of either gallbladder or small bowel. IMPRESSION: Nonvisualization of gallbladder over a 3 hour time span is suggestive of cystic duct obstruction/ acute cholecystitis. Note, however, that there is also nonvisualization of small bowel after 3 hours. This finding potentially could be indicative of common bile duct obstruction. However, this finding also could be indicative of hepatic stasis phenomenon. Serum bilirubin is moderately elevated at this time. Known liver metastases demonstrated by inhomogeneous uptake of radiotracer. Given lack of visualization of both gallbladder and small bowel, it may be prudent to consider MRCP to assess for possible common bile duct lesion as the cause for lack of small bowel visualization on this study. Electronically Signed   By: WLowella GripIII M.D.   On: 08/26/2016 16:13   UKoreaAbdomen Complete  Result Date: 08/26/2016 CLINICAL DATA:  RIGHT upper quadrant pain beginning yesterday. Jaundice. History of metastatic breast cancer with known liver metastasis. EXAM: ABDOMEN ULTRASOUND COMPLETE COMPARISON:  Abdominal ultrasound Aug 15, 2016 and MRI of the abdomen July 24, 2016 FINDINGS: Gallbladder: 1.9 cm echogenic gallstone with acoustic shadowing surrounded by mildly echogenic sludge with increased through transmission. Mild gallbladder wall thickening without pericholecystic fluid. Sonographic Murphy's sign elicited. Common bile duct: Diameter: 5 mm Liver: Multiple heterogeneous liver mass is consistent with patient's known metastasis. Porta hepatis lymphadenopathy present though, better characterized on prior MRI. IVC: No abnormality visualized. Pancreas: Visualized portion unremarkable. Spleen: Size and appearance within normal limits. Right Kidney: Length: 11.5 cm. Echogenicity within normal limits. No mass or hydronephrosis visualized. Left Kidney: Length: 11.8 cm. Echogenicity within normal limits. No mass or hydronephrosis visualized.  Abdominal aorta: No aneurysm visualized. Other findings: None. IMPRESSION: Cholelithiasis and sonographic findings of acute cholecystitis. Numerous hepatic and nodal metastasis. Electronically Signed   By: CElon AlasM.D.   On: 08/26/2016 06:36    Assessment: 56y.o. BRCA negative [place] woman with stage IV breast cancer, history as follows  (1)  S/p Right upper inner quadrant lumpectomy and sentinel lymph node sampling 03/15/2004 for a pT1c pN0. Stage IA invasive ductal carcinoma, grade 2, estrogen receptor 95% positive, progesterone receptor 65% positive, HER-2 not amplified; additional surgery 04/25/2004 for seroma or clearance showed no residual tumor  (2) adjuvant chemotherapy with cyclophosphamide and doxorubicin every 21 days x4 completed 07/19/2004  (3) adjuvant radiation [place]  (4) the patient opted against adjuvant antiestrogen therapy  (5) genetics testing showed no BRCA mutations  (6) biopsy of a palpable right axillary mass 10/24/2009 showed invasive ductal carcinoma, grade 3, estrogen receptor 100% positive, progesterone receptor 2% positive (alert score 5) HER-2 negative; no evidence of systemic disease on PET scanning  (7) completed 3  of 4 planned cycles of docetaxel and cyclophosphamide September 2011, fourth cycle omitted because of marked elevations in liver function tests  (8) an right axillary lymph node dissection 03/06/2010 showed 3/8 lymph nodes removed to be involved by tumor, with extracapsular extension.  (9) 45 Gy radiation to the right axillary and right supraclavicular nodal areas, with capecitabine sensitization, completed March 2012   (10) intolerant of letrozole and exemestane; on tamoxifen with interruptions September 2012 to March 2013, but then continuing on tamoxifen more continuously through March of 2015  (11) biopsy of mediastinal adenopathy 06/02/2013 shows invasive ductal  carcinoma (gross cystic disease fluid protein positive, TTS-1 negative), estrogen receptor 80% positive, progesterone receptor 2% positive, HER-2 not amplified  (12) letrozole started March 2015-- tolerated with significant side effects, discontinued at the end of May 2015  (13) PET scan 08/16/2013 shows extensive left pleural metastatic disease and a large left pleural effusion that shifts cardiac and mediastinal structures to the right; adenopathy (celiac trunk, periadrenal, periaortic); and a left medial clavicular lesion; Status post left thoracentesis 08/16/2013 positive for adenocarcinoma, estrogen receptor positive, progesterone receptor negative.  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; everolimus was suggested as an available targeted agent  (18) exemestane started 03/31/2014, discontinued 10/31/2014 with evidence of progression             (a) everolimus added 04/03/2014 but not tolerated (cytopenias, elevated LFTs) even at minimal doses; stopped 04/17/2014  (19) fulvestrant started 12/20/2014             (a) palbociclib added at very low dose 04/03/2015 (starting dose 75 mg weekly)             (b) palbociclib dose gradually increased to 75 mg daily, 21/7, as of May 2017             (c) palbociclib dose increased to 100 mg daily, 21/ 7, beginning November mid- cycle             (d) palbociclib dose decreased to 75 mg daily beginning with cycle starting 05/25/2016             (e) letrozole 2.5 mg  started 05/26/2016, held as of 07/25/2016 with poor tolerance             (f) palbociclib dose decreased to 75 mg every other day beginning 07/25/2016  (20) liver biopsy 03/20/2015 confirms metastatic carcinoma, still estrogen receptor positive at 100%, progesterone receptor negative, HER-2 equivocal with a signals ratio 1.41, number per cell 4.50.              (a) repeat liver biopsy December 2017 might show further changes (HER-2 positivity)?  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 treated with Lovenox for 5-6 months  (c) right upper extremity lymphedema--receiving physical therapy  (d) hepatic steatosis with chronically elevated LFTs as well as unusual hepatic sensitivity to chemotherapy  (e) osteopenia with the lowest T score -1.6 on bone density scan 06/20/2013             (i) on denosumab/ Xgeva Q28d  (f) radiation oncology (Dr Valere Dross) has reviewed prior radiation records in case there is further mediastinal involvement with dysphagia etc in which case palliative XRT could be considered             (a) radiation to left mediastinum/ left 7th rib 3250 cGy in 13 sessions04/18/2016 through 08/02/2014             (b) radiation to T11 area: 22 Gy in 7 sessions, last dose 11/27/2014             (c) radiation left parietal scalp region to be  completed 12/22/2014             (d) radiation to sacral area completed 04/09/2015             (e) radiation to right inferior pelvis and left ninth rib (24 gray, 12/24/2015--01/02/2016)             (f) T11 was treated stereotactically with 14 gray in 1 fraction 03/12/2016.  (g) chest wall and perineal pain--improved post radiation treatments             (a) discussed celebrex/ carafate but demurs  (h) zoster diagnosed 04/04/2015-- on valacyclovir--resolved  (i) cholecystitis vs cholestasis on nuclear med hepatobiliary imaging 08/26/2016   Plan:  It is difficult to entirely rule out or in cholecystitis in Aynslee's case. Her pain may be from liver capsule involvement by tumor and the negative scan today could be due to cholestasis due to liver involveement by tumor. Or she could have cholecystitis due to her known gallstone despite the lack of a temperature, leukocytosis or CBD dilatation.  Given this uncertainty and the patient's overall situation (recovering from possible liver injury vs response to treatment with associated inflammation) watchful waiting under ABX may be reasonable. I will defer to surgery regarding a final determination and plan.  Other concerns: (a) access is very difficult in this patient; she agrees to a PICC line; she requests IR to place (not IV team); will place order and defer AM labs until PICC in place (b) she wishes to drink water; since we have not made a definitive decision re surgery, I will allow water now but NPO after midnight  Appreciate everyone's help to this complex patient. Will follow closely with you.    Chauncey Cruel, MD 08/26/2016  4:38 PM Medical Oncology and Hematology [facility] [address] [place], [place] [address] Tel. [phone]    Fax. [phone]

## 2016-08-26 NOTE — ED Triage Notes (Signed)
RUQ abd pain onset last PM Hx gallstones

## 2016-08-27 ENCOUNTER — Ambulatory Visit: Payer: 59

## 2016-08-27 ENCOUNTER — Other Ambulatory Visit: Payer: 59

## 2016-08-27 DIAGNOSIS — K8020 Calculus of gallbladder without cholecystitis without obstruction: Secondary | ICD-10-CM

## 2016-08-27 DIAGNOSIS — R17 Unspecified jaundice: Secondary | ICD-10-CM

## 2016-08-27 DIAGNOSIS — K8013 Calculus of gallbladder with acute and chronic cholecystitis with obstruction: Secondary | ICD-10-CM

## 2016-08-27 LAB — COMPREHENSIVE METABOLIC PANEL
ALBUMIN: 2.8 g/dL — AB (ref 3.5–5.0)
ALK PHOS: 522 U/L — AB (ref 38–126)
ALT: 341 U/L — ABNORMAL HIGH (ref 14–54)
ANION GAP: 9 (ref 5–15)
AST: 313 U/L — ABNORMAL HIGH (ref 15–41)
BUN: 7 mg/dL (ref 6–20)
CO2: 23 mmol/L (ref 22–32)
Calcium: 7.6 mg/dL — ABNORMAL LOW (ref 8.9–10.3)
Chloride: 103 mmol/L (ref 101–111)
Creatinine, Ser: 0.31 mg/dL — ABNORMAL LOW (ref 0.44–1.00)
GFR calc non Af Amer: >60 mL/min (ref >60)
GLUCOSE: 87 mg/dL (ref 65–99)
POTASSIUM: 3.7 mmol/L (ref 3.5–5.1)
SODIUM: 135 mmol/L (ref 135–145)
TOTAL PROTEIN: 6.1 g/dL — AB (ref 6.5–8.1)
Total Bilirubin: 13 mg/dL — ABNORMAL HIGH (ref 0.3–1.2)

## 2016-08-27 LAB — PROTIME-INR
INR: 1.07
PROTHROMBIN TIME: 14 s (ref 11.4–15.2)

## 2016-08-27 LAB — CBC
HCT: 30.4 % — ABNORMAL LOW (ref 36.0–46.0)
Hemoglobin: 11.1 g/dL — ABNORMAL LOW (ref 12.0–15.0)
MCH: 36.6 pg — AB (ref 26.0–34.0)
MCHC: 36.5 g/dL — ABNORMAL HIGH (ref 30.0–36.0)
MCV: 100.3 fL — ABNORMAL HIGH (ref 78.0–100.0)
Platelets: 466 10*3/uL — ABNORMAL HIGH (ref 150–400)
RBC: 3.03 MIL/uL — ABNORMAL LOW (ref 3.87–5.11)
RDW: 14.4 % (ref 11.5–15.5)
WBC: 4.8 10*3/uL (ref 4.0–10.5)

## 2016-08-27 NOTE — Progress Notes (Signed)
PROGRESS NOTE  [name] [name] YHC:[id number] DOB: December 04, 1960 DOA: 08/26/2016 PCP: Chauncey Cruel, MD   LOS: 1 day   Brief Narrative: [name] [name]  is a 56 y.o. 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 Jana Hakim, GERD, anxiety who comes in with one-day history of sharp right upper quadrant abdominal pain which is nonradiating, associated with mild nausea.  She was found to have significantly elevated LFTs, gastroenterology, general surgery and oncology were consulted.  Assessment & Plan: Principal Problem:   RUQ pain Active Problems:   Malignant neoplasm of upper-inner quadrant of right breast in female, estrogen receptor positive (HCC)   Abnormal LFTs (liver function tests)   Bone metastases (HCC)   Malignant pleural effusion, left   Liver metastases (HCC)   Cholelithiasis   Jaundice   Elevated liver function tests -Somewhat of a not clear story as to what is causing this elevated LFTs.  Ultrasound was suspicious for acute cholecystitis and a HIDA scan did not show any tracer into the gallbladder or into the duodenum. -Discussed with Dr. Loletha Carrow, this is most likely due to [facility] chemotherapy rather than cholecystitis -discussed with general surgery as well today -bilirubin has not peaked yet, continue to monitor, not ready for discharge  Metastatic stage IV breast cancer with metastases to skull, left pleura and liver -Outpatient management, discussed this morning with Dr. Jana Hakim  Anxiety -ativan PRN  Anemia -likely in the setting of malignancy   DVT prophylaxis: heparin Code Status: Full code Family Communication: no family at bedside Disposition Plan: home 1 day if LFTs stable  Consultants:   GI  General surgery  Oncology  Procedures:   None   Antimicrobials:   Zosyn 5/29 >>  Subjective: - no chest pain, shortness of  breath, no nausea/vomiting. Complains of right sided abdominal pain  Objective: Vitals:   08/26/16 1039 08/26/16 2031 08/27/16 0116 08/27/16 0500  BP: (!) 131/93 128/75  119/81  Pulse: 100 88  83  Resp: 18 16  18   Temp: 98.5 F (36.9 C) 97.9 F (36.6 C)  97.6 F (36.4 C)  TempSrc: Oral Oral  Oral  SpO2: 99% 99%  99%  Weight:   78.9 kg (174 lb)     Intake/Output Summary (Last 24 hours) at 08/27/16 1201 Last data filed at 08/27/16 0831  Gross per 24 hour  Intake          2304.17 ml  Output             1450 ml  Net           854.17 ml   Filed Weights   08/27/16 0116  Weight: 78.9 kg (174 lb)    Examination:  Vitals:   08/26/16 1039 08/26/16 2031 08/27/16 0116 08/27/16 0500  BP: (!) 131/93 128/75  119/81  Pulse: 100 88  83  Resp: 18 16  18   Temp: 98.5 F (36.9 C) 97.9 F (36.6 C)  97.6 F (36.4 C)  TempSrc: Oral Oral  Oral  SpO2: 99% 99%  99%  Weight:   78.9 kg (174 lb)     Constitutional: NAD Eyes:  lids and conjunctivae icteric ENMT: Mucous membranes are moist.  Respiratory: clear to auscultation bilaterally, no wheezing, no crackles. Normal respiratory effort. No accessory muscle use.  Cardiovascular: Regular rate and rhythm, no murmurs / rubs / gallops. No LE edema. 2+ pedal pulses. No  carotid bruits.  Abdomen: + tenderness RUQ. Bowel sounds positive.  Skin: no rashes, lesions, ulcers. No induration Neurologic: non focal    Data Reviewed: I have personally reviewed following labs and imaging studies  CBC:  Recent Labs Lab 08/26/16 0300 08/26/16 0359 08/27/16 0404  WBC 7.4  --  4.8  NEUTROABS 6.0  --   --   HGB 11.6* 12.2 11.1*  HCT 33.0* 36.0 30.4*  MCV 102.5*  --  100.3*  PLT 542*  --  062*   Basic Metabolic Panel:  Recent Labs Lab 08/26/16 0300 08/26/16 0359 08/27/16 0404  NA 129* 131* 135  K 3.8 3.9 3.7  CL 98* 98* 103  CO2 22  --  23  GLUCOSE 123* 121* 87  BUN 6 4* 7  CREATININE 0.34* 0.50 0.31*  CALCIUM 8.0*  --  7.6*    GFR: Estimated Creatinine Clearance: 79.8 mL/min (A) (by C-G formula based on SCr of 0.31 mg/dL (L)). Liver Function Tests:  Recent Labs Lab 08/26/16 0300 08/27/16 0404  AST 306* 313*  ALT 370* 341*  ALKPHOS 542* 522*  BILITOT 12.5* 13.0*  PROT 6.8 6.1*  ALBUMIN 3.2* 2.8*    Recent Labs Lab 08/26/16 0300  LIPASE 29   No results for input(s): AMMONIA in the last 168 hours. Coagulation Profile:  Recent Labs Lab 08/27/16 0404  INR 1.07   Cardiac Enzymes: No results for input(s): CKTOTAL, CKMB, CKMBINDEX, TROPONINI in the last 168 hours. BNP (last 3 results) No results for input(s): PROBNP in the last 8760 hours. HbA1C: No results for input(s): HGBA1C in the last 72 hours. CBG: No results for input(s): GLUCAP in the last 168 hours. Lipid Profile: No results for input(s): CHOL, HDL, LDLCALC, TRIG, CHOLHDL, LDLDIRECT in the last 72 hours. Thyroid Function Tests: No results for input(s): TSH, T4TOTAL, FREET4, T3FREE, THYROIDAB in the last 72 hours. Anemia Panel: No results for input(s): VITAMINB12, FOLATE, FERRITIN, TIBC, IRON, RETICCTPCT in the last 72 hours. Urine analysis:    Component Value Date/Time   COLORURINE AMBER (A) 08/26/2016 0334   APPEARANCEUR CLEAR 08/26/2016 0334   LABSPEC 1.015 08/26/2016 0334   LABSPEC 1.005 05/16/2016 1201   PHURINE 5.0 08/26/2016 0334   GLUCOSEU NEGATIVE 08/26/2016 0334   GLUCOSEU Negative 05/16/2016 1201   HGBUR SMALL (A) 08/26/2016 0334   BILIRUBINUR MODERATE (A) 08/26/2016 0334   BILIRUBINUR Negative 05/16/2016 1201   KETONESUR 20 (A) 08/26/2016 0334   PROTEINUR NEGATIVE 08/26/2016 0334   UROBILINOGEN 0.2 05/16/2016 1201   NITRITE NEGATIVE 08/26/2016 0334   LEUKOCYTESUR NEGATIVE 08/26/2016 0334   LEUKOCYTESUR Small 05/16/2016 1201   Sepsis Labs: Invalid input(s): PROCALCITONIN, LACTICIDVEN  No results found for this or any previous visit (from the past 240 hour(s)).    Radiology Studies: Nm Hepatobiliary Liver  Func  Result Date: 08/26/2016 CLINICAL DATA:  Right upper quadrant pain EXAM: NUCLEAR MEDICINE HEPATOBILIARY IMAGING VIEWS: Anterior right upper quadrant RADIOPHARMACEUTICALS:  7.9 mCi Tc-26m  Choletec IV COMPARISON:  Ultrasound right upper quadrant Aug 15, 2016 FINDINGS: Images were obtained serially over a 3 hour time span. Morphine was not administered due to history of allergic reaction to morphine. Liver uptake of radiotracer is inhomogeneous consistent with known liver metastases. Over a 3 hour time span, there is no appreciable visualization of either gallbladder or small bowel. IMPRESSION: Nonvisualization of gallbladder over a 3 hour time span is suggestive of cystic duct obstruction/ acute cholecystitis. Note, however, that there is also nonvisualization of small bowel after 3 hours.  This finding potentially could be indicative of common bile duct obstruction. However, this finding also could be indicative of hepatic stasis phenomenon. Serum bilirubin is moderately elevated at this time. Known liver metastases demonstrated by inhomogeneous uptake of radiotracer. Given lack of visualization of both gallbladder and small bowel, it may be prudent to consider MRCP to assess for possible common bile duct lesion as the cause for lack of small bowel visualization on this study. Electronically Signed   By: Lowella Grip III M.D.   On: 08/26/2016 16:13   US Abdomen Complete  Result Date: 08/26/2016 CLINICAL DATA:  RIGHT upper quadrant pain beginning yesterday. Jaundice. History of metastatic breast cancer with known liver metastasis. EXAM: ABDOMEN ULTRASOUND COMPLETE COMPARISON:  Abdominal ultrasound Aug 15, 2016 and MRI of the abdomen July 24, 2016 FINDINGS: Gallbladder: 1.9 cm echogenic gallstone with acoustic shadowing surrounded by mildly echogenic sludge with increased through transmission. Mild gallbladder wall thickening without pericholecystic fluid. Sonographic Murphy's sign elicited. Common  bile duct: Diameter: 5 mm Liver: Multiple heterogeneous liver mass is consistent with patient's known metastasis. Porta hepatis lymphadenopathy present though, better characterized on prior MRI. IVC: No abnormality visualized. Pancreas: Visualized portion unremarkable. Spleen: Size and appearance within normal limits. Right Kidney: Length: 11.5 cm. Echogenicity within normal limits. No mass or hydronephrosis visualized. Left Kidney: Length: 11.8 cm. Echogenicity within normal limits. No mass or hydronephrosis visualized. Abdominal aorta: No aneurysm visualized. Other findings: None. IMPRESSION: Cholelithiasis and sonographic findings of acute cholecystitis. Numerous hepatic and nodal metastasis. Electronically Signed   By: Elon Alas M.D.   On: 08/26/2016 06:36     Scheduled Meds: . famotidine  40 mg Oral Daily  . heparin  5,000 Units Subcutaneous Q8H   Continuous Infusions: . sodium chloride 75 mL/hr at 08/26/16 1506  . piperacillin-tazobactam (ZOSYN)  IV 3.375 g (08/27/16 0834)       Time spent: 35 minutes, more than 50% bedside discussing with patient, also in discussions on the floor with GI, Oncology MDs and Surgery PA    Marzetta Board, MD, PhD [facility] Pager [phone]  If 7PM-7AM, please contact night-coverage [email or web address] Password [email or web address] 08/27/2016, 12:01 PM

## 2016-08-27 NOTE — Progress Notes (Signed)
[name] [name]   DOB:1960/09/20   LE#:[id number]   BSW#:[id number]  Subjective: [name] [name] is moderately comfortable. She is tolerating the toradol well--she has been resistant as outpatient to take any analgesics. She is appropriately concerned as to how to "go on from here." Discussed chemoembolization of her liver lesions per IR and second opinion re liver surgery at [facility]). At this point she is "not ready" for those options. Husband not in room  Objective: middle aged White woman examined in bed Vitals:   08/26/16 2031 08/27/16 0500  BP: 128/75 119/81  Pulse: 88 83  Resp: 16 18  Temp: 97.9 F (36.6 C) 97.6 F (36.4 C)    Body mass index is 29.87 kg/m.  Intake/Output Summary (Last 24 hours) at 08/27/16 0821 Last data filed at 08/27/16 0300  Gross per 24 hour  Intake          2304.17 ml  Output             1750 ml  Net           554.17 ml    Sclerae minimally icteric, EOMs intact Oropharynx clear, slightly dry No cervical or supraclavicular adenopathy Lungs no rales or rhonchi Heart regular rate and rhythm Abd firm UOQ, mild tenderness, no rebound, +BS Neuro: nonfocal, well oriented, appropriate affect Breasts: Deferred   CBG (last 3)  No results for input(s): GLUCAP in the last 72 hours.   Labs:  Lab Results  Component Value Date   WBC 4.8 08/27/2016   HGB 11.1 (L) 08/27/2016   HCT 30.4 (L) 08/27/2016   MCV 100.3 (H) 08/27/2016   PLT 466 (H) 08/27/2016   NEUTROABS 6.0 08/26/2016    _0 @  Urine Studies No results for input(s): UHGB, CRYS in the last 72 hours.  Invalid input(s): UACOL, UAPR, USPG, UPH, UTP, UGL, UKET, UBIL, UNIT, UROB, ULEU, UEPI, UWBC, URBC, UBAC, CAST, [place], [place]  Basic Metabolic Panel:  Recent Labs Lab 08/26/16 0300 08/26/16 0359 08/27/16 0404  NA 129* 131* 135  K 3.8 3.9 3.7  CL 98* 98* 103  CO2 22  --  23  GLUCOSE 123* 121* 87  BUN 6 4* 7  CREATININE 0.34* 0.50 0.31*  CALCIUM 8.0*  --  7.6*   GFR Estimated  Creatinine Clearance: 79.8 mL/min (A) (by C-G formula based on SCr of 0.31 mg/dL (L)). Liver Function Tests:  Recent Labs Lab 08/26/16 0300 08/27/16 0404  AST 306* 313*  ALT 370* 341*  ALKPHOS 542* 522*  BILITOT 12.5* 13.0*  PROT 6.8 6.1*  ALBUMIN 3.2* 2.8*    Recent Labs Lab 08/26/16 0300  LIPASE 29   No results for input(s): AMMONIA in the last 168 hours. Coagulation profile  Recent Labs Lab 08/27/16 0404  INR 1.07    CBC:  Recent Labs Lab 08/26/16 0300 08/26/16 0359 08/27/16 0404  WBC 7.4  --  4.8  NEUTROABS 6.0  --   --   HGB 11.6* 12.2 11.1*  HCT 33.0* 36.0 30.4*  MCV 102.5*  --  100.3*  PLT 542*  --  466*   Cardiac Enzymes: No results for input(s): CKTOTAL, CKMB, CKMBINDEX, TROPONINI in the last 168 hours. BNP: Invalid input(s): POCBNP CBG: No results for input(s): GLUCAP in the last 168 hours. D-Dimer No results for input(s): DDIMER in the last 72 hours. Hgb A1c No results for input(s): HGBA1C in the last 72 hours. Lipid Profile No results for input(s): CHOL, HDL, LDLCALC, TRIG, CHOLHDL, LDLDIRECT in the last 72  hours. Thyroid function studies No results for input(s): TSH, T4TOTAL, T3FREE, THYROIDAB in the last 72 hours.  Invalid input(s): FREET3 Anemia work up No results for input(s): VITAMINB12, FOLATE, FERRITIN, TIBC, IRON, RETICCTPCT in the last 72 hours. Microbiology No results found for this or any previous visit (from the past 240 hour(s)).    Studies:  Nm Hepatobiliary Liver Func  Result Date: 08/26/2016 CLINICAL DATA:  Right upper quadrant pain EXAM: NUCLEAR MEDICINE HEPATOBILIARY IMAGING VIEWS: Anterior right upper quadrant RADIOPHARMACEUTICALS:  7.9 mCi Tc-52m Choletec IV COMPARISON:  Ultrasound right upper quadrant Aug 15, 2016 FINDINGS: Images were obtained serially over a 3 hour time span. Morphine was not administered due to history of allergic reaction to morphine. Liver uptake of radiotracer is inhomogeneous consistent with  known liver metastases. Over a 3 hour time span, there is no appreciable visualization of either gallbladder or small bowel. IMPRESSION: Nonvisualization of gallbladder over a 3 hour time span is suggestive of cystic duct obstruction/ acute cholecystitis. Note, however, that there is also nonvisualization of small bowel after 3 hours. This finding potentially could be indicative of common bile duct obstruction. However, this finding also could be indicative of hepatic stasis phenomenon. Serum bilirubin is moderately elevated at this time. Known liver metastases demonstrated by inhomogeneous uptake of radiotracer. Given lack of visualization of both gallbladder and small bowel, it may be prudent to consider MRCP to assess for possible common bile duct lesion as the cause for lack of small bowel visualization on this study. Electronically Signed   By: WLowella GripIII M.D.   On: 08/26/2016 16:13   UKoreaAbdomen Complete  Result Date: 08/26/2016 CLINICAL DATA:  RIGHT upper quadrant pain beginning yesterday. Jaundice. History of metastatic breast cancer with known liver metastasis. EXAM: ABDOMEN ULTRASOUND COMPLETE COMPARISON:  Abdominal ultrasound Aug 15, 2016 and MRI of the abdomen July 24, 2016 FINDINGS: Gallbladder: 1.9 cm echogenic gallstone with acoustic shadowing surrounded by mildly echogenic sludge with increased through transmission. Mild gallbladder wall thickening without pericholecystic fluid. Sonographic Murphy's sign elicited. Common bile duct: Diameter: 5 mm Liver: Multiple heterogeneous liver mass is consistent with patient's known metastasis. Porta hepatis lymphadenopathy present though, better characterized on prior MRI. IVC: No abnormality visualized. Pancreas: Visualized portion unremarkable. Spleen: Size and appearance within normal limits. Right Kidney: Length: 11.5 cm. Echogenicity within normal limits. No mass or hydronephrosis visualized. Left Kidney: Length: 11.8 cm. Echogenicity within  normal limits. No mass or hydronephrosis visualized. Abdominal aorta: No aneurysm visualized. Other findings: None. IMPRESSION: Cholelithiasis and sonographic findings of acute cholecystitis. Numerous hepatic and nodal metastasis. Electronically Signed   By: CElon AlasM.D.   On: 08/26/2016 06:36    Assessment: 56y.o. BRCA negative [place] woman with stage IV breast cancer, history as follows  (1)  S/p Right upper inner quadrant lumpectomy and sentinel lymph node sampling 03/15/2004 for a pT1c pN0. Stage IA invasive ductal carcinoma, grade 2, estrogen receptor 95% positive, progesterone receptor 65% positive, HER-2 not amplified; additional surgery 04/25/2004 for seroma or clearance showed no residual tumor  (2) adjuvant chemotherapy with cyclophosphamide and doxorubicin every 21 days x4 completed 07/19/2004  (3) adjuvant radiation given under Dr. [place]onella Stadein [place] July 2006  (4) the patient opted against adjuvant antiestrogen therapy  (5) genetics testing showed no BRCA mutations  (6) biopsy of a palpable right axillary mass 10/24/2009 showed invasive ductal carcinoma, grade 3, estrogen receptor 100% positive, progesterone receptor 2% positive (alert score 5) HER-2 negative; no evidence of systemic  disease on PET scanning  (7) completed 3 of 4 planned cycles of docetaxel and cyclophosphamide September 2011, fourth cycle omitted because of marked elevations in liver function tests  (8) an right axillary lymph node dissection 03/06/2010 showed 3/8 lymph nodes removed to be involved by tumor, with extracapsular extension.  (9) 45 Gy radiation to the right axillary and right supraclavicular nodal areas, with capecitabine sensitization, completed March 2012   (10) intolerant of letrozole and exemestane; on tamoxifen with interruptions September 2012 to March 2013, but then continuing on tamoxifen more continuously through March of 2015  (11) biopsy of  mediastinal adenopathy 06/02/2013 shows invasive ductal carcinoma (gross cystic disease fluid protein positive, TTS-1 negative), estrogen receptor 80% positive, progesterone receptor 2% positive, HER-2 not amplified  (12) letrozole started March 2015-- tolerated with significant side effects, discontinued at the end of May 2015  (13) PET scan 08/16/2013 shows extensive left pleural metastatic disease and a large left pleural effusion that shifts cardiac and mediastinal structures to the right; adenopathy (celiac trunk, periadrenal, periaortic); and a left medial clavicular lesion; Status post left thoracentesis 08/16/2013 positive for adenocarcinoma, estrogen receptor positive, progesterone receptor negative.  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; everolimus was suggested as an available targeted agent  (18) exemestane started 03/31/2014, discontinued 10/31/2014 with evidence of progression             (a) everolimus added 04/03/2014 but not tolerated (cytopenias, elevated LFTs) even at minimal doses; stopped 04/17/2014  (19) fulvestrant started 12/20/2014             (a) palbociclib added at very low dose 04/03/2015 (starting dose 75 mg weekly)             (b) palbociclib dose gradually increased to 75 mg daily, 21/7, as of May 2017             (c) palbociclib dose increased to 100 mg daily, 21/ 7, beginning November mid- cycle             (d) palbociclib dose decreased to 75 mg daily beginning with cycle  starting 05/25/2016             (e) letrozole 2.5 mg started 05/26/2016, held as of 07/25/2016 with poor tolerance             (f) palbociclib dose decreased to 75 mg every other day beginning 07/25/2016  (20) liver biopsy 03/20/2015 confirms metastatic carcinoma, still estrogen receptor positive at 100%, progesterone receptor negative, HER-2 equivocal with a signals ratio 1.41, number per cell 4.50.              (a) repeat liver biopsy December 2017 might show further changes (HER-2 positivity)?  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 treated with Lovenox for 5-6 months  (c) right upper extremity lymphedema--receiving physical therapy  (d) hepatic steatosis with chronically elevated LFTs as well as unusual hepatic sensitivity to chemotherapy  (e) osteopenia with the lowest T score -1.6 on bone density scan 06/20/2013             (i) on denosumab/ Xgeva Q28d  (f) radiation oncology (Dr Valere Dross) has reviewed prior radiation records in case there is further mediastinal involvement with dysphagia etc in which case palliative XRT could be considered             (a) radiation to left mediastinum/ left 7th rib 3250 cGy in 13 sessions04/18/2016 through 08/02/2014             (b) radiation to T11 area: 22 Gy in 7 sessions, last dose 11/27/2014              (c) radiation left parietal scalp region to be completed 12/22/2014             (d) radiation to sacral area completed 04/09/2015             (e) radiation to right inferior pelvis and left ninth rib (24 gray, 12/24/2015--01/02/2016)             (f) T11 was treated stereotactically with 14 gray in 1 fraction 03/12/2016.  (g) chest wall and perineal pain--improved post radiation treatments             (a) discussed celebrex/ carafate but demurs  (h) zoster diagnosed 04/04/2015-- on valacyclovir--resolved  (i) cholecystitis vs cholestasis on nuclear med hepatobiliary imaging 08/26/2016   Plan:  Reviewed labs with [name]-- LFTs are essentially stable.  Reviewed overall situation. It is not clear the main problem is GB related or that if she underwent cholecystectomy there would be any improvement in her situation. Accordingly I expect surgery's verdict will be to continue observation and not to proceed to invasive interventions. Await Dr Darrel Hoover opinion.  If no surgery is planned and patient is stable I think d/c to home with close follow-up in our clinic would be adequate.  She is due for fulvestrant and denosumab today, but we will postpone to next week.  Would discharge on naproxen 220 mg (aleve) po QID PRN for pain.  Appreciate your help with this complex patient!    Chauncey Cruel, MD 08/27/2016  8:21 AM Medical Oncology and Hematology [facility] [address] [place], [place] [address] Tel. [phone]    Fax. [phone]

## 2016-08-27 NOTE — Progress Notes (Signed)
[facility] Progress Note     Subjective: CC: abdominal pain, jaundice  RUQ pain unchanged compared to yesterday, located over lateral aspect RUQ, worse with deep inspiration or coughing. Pain controlled on current meds + ice. Denies fever, chills, nausea, vomiting, or diarrhea.   Afebrile, VSS No leukocytosis, LFT's roughly stable (as below) Objective: Vital signs in last 24 hours: Temp:  [97.6 F (36.4 C)-98.5 F (36.9 C)] 97.6 F (36.4 C) (05/30 0500) Pulse Rate:  [83-100] 83 (05/30 0500) Resp:  [16-18] 18 (05/30 0500) BP: (119-131)/(75-93) 119/81 (05/30 0500) SpO2:  [97 %-99 %] 99 % (05/30 0500) Weight:  [78.9 kg (174 lb)] 78.9 kg (174 lb) (05/30 0116) Last BM Date: 08/25/16  Intake/Output from previous day: 05/29 0701 - 05/30 0700 In: 2304.2 [P.O.:120; I.V.:2084.2; IV Piggyback:100] Out: 1750 [Urine:1750] Intake/Output this shift: No intake/output data recorded.  PE: Gen:  Alert, NAD, pleasant HEENT: EOM's intact, scleral icterus bilaterally  Card:  Regular rate and rhythm, pedal pulses 2+ BL Pulm:  Normal effort, clear to auscultation bilaterally Abd: Soft, TTP ruq without peritonitis, bowel sounds present, no peritonitis. Skin: warm and dry, jaundice present.  Psych: A&Ox3   Lab Results:  Hepatic Function Latest Ref Rng & Units 08/27/2016 08/26/2016 08/19/2016  Total Protein 6.5 - 8.1 g/dL 6.1(L) 6.8 6.5  Albumin 3.5 - 5.0 g/dL 2.8(L) 3.2(L) 3.6  AST 15 - 41 U/L 313(H) 306(H) 334(HH)  ALT 14 - 54 U/L 341(H) 370(H) 562(HH)  Alk Phosphatase 38 - 126 U/L 522(H) 542(H) 404(H)  Total Bilirubin 0.3 - 1.2 mg/dL 13.0(H) 12.5(H) 5.23(HH)     Recent Labs  08/26/16 0300 08/26/16 0359 08/27/16 0404  WBC 7.4  --  4.8  HGB 11.6* 12.2 11.1*  HCT 33.0* 36.0 30.4*  PLT 542*  --  466*   BMET  Recent Labs  08/26/16 0300 08/26/16 0359 08/27/16 0404  NA 129* 131* 135  K 3.8 3.9 3.7  CL 98* 98* 103  CO2 22  --  23  GLUCOSE 123* 121* 87  BUN 6 4* 7   CREATININE 0.34* 0.50 0.31*  CALCIUM 8.0*  --  7.6*   PT/INR  Recent Labs  08/27/16 0404  LABPROT 14.0  INR 1.07   Studies/Results: Nm Hepatobiliary Liver Func  Result Date: 08/26/2016 CLINICAL DATA:  Right upper quadrant pain EXAM: NUCLEAR MEDICINE HEPATOBILIARY IMAGING VIEWS: Anterior right upper quadrant RADIOPHARMACEUTICALS:  7.9 mCi Tc-69m Choletec IV COMPARISON:  Ultrasound right upper quadrant Aug 15, 2016 FINDINGS: Images were obtained serially over a 3 hour time span. Morphine was not administered due to history of allergic reaction to morphine. Liver uptake of radiotracer is inhomogeneous consistent with known liver metastases. Over a 3 hour time span, there is no appreciable visualization of either gallbladder or small bowel. IMPRESSION: Nonvisualization of gallbladder over a 3 hour time span is suggestive of cystic duct obstruction/ acute cholecystitis. Note, however, that there is also nonvisualization of small bowel after 3 hours. This finding potentially could be indicative of common bile duct obstruction. However, this finding also could be indicative of hepatic stasis phenomenon. Serum bilirubin is moderately elevated at this time. Known liver metastases demonstrated by inhomogeneous uptake of radiotracer. Given lack of visualization of both gallbladder and small bowel, it may be prudent to consider MRCP to assess for possible common bile duct lesion as the cause for lack of small bowel visualization on this study. Electronically Signed   By: WLowella GripIII M.D.   On: 08/26/2016 16:13  US Abdomen Complete  Result Date: 08/26/2016 CLINICAL DATA:  RIGHT upper quadrant pain beginning yesterday. Jaundice. History of metastatic breast cancer with known liver metastasis. EXAM: ABDOMEN ULTRASOUND COMPLETE COMPARISON:  Abdominal ultrasound Aug 15, 2016 and MRI of the abdomen July 24, 2016 FINDINGS: Gallbladder: 1.9 cm echogenic gallstone with acoustic shadowing surrounded by  mildly echogenic sludge with increased through transmission. Mild gallbladder wall thickening without pericholecystic fluid. Sonographic Murphy's sign elicited. Common bile duct: Diameter: 5 mm Liver: Multiple heterogeneous liver mass is consistent with patient's known metastasis. Porta hepatis lymphadenopathy present though, better characterized on prior MRI. IVC: No abnormality visualized. Pancreas: Visualized portion unremarkable. Spleen: Size and appearance within normal limits. Right Kidney: Length: 11.5 cm. Echogenicity within normal limits. No mass or hydronephrosis visualized. Left Kidney: Length: 11.8 cm. Echogenicity within normal limits. No mass or hydronephrosis visualized. Abdominal aorta: No aneurysm visualized. Other findings: None. IMPRESSION: Cholelithiasis and sonographic findings of acute cholecystitis. Numerous hepatic and nodal metastasis. Electronically Signed   By: Elon Alas M.D.   On: 08/26/2016 06:36    Anti-infectives: Anti-infectives    Start     Dose/Rate Route Frequency Ordered Stop   08/26/16 1700  piperacillin-tazobactam (ZOSYN) IVPB 3.375 g     3.375 g 12.5 mL/hr over 240 Minutes Intravenous Every 8 hours 08/26/16 0806     08/26/16 0700  piperacillin-tazobactam (ZOSYN) IVPB 3.375 g     3.375 g 100 mL/hr over 30 Minutes Intravenous  Once 08/26/16 0645 08/26/16 0814   08/26/16 0645  vancomycin (VANCOCIN) IVPB 1000 mg/200 mL premix  Status:  Discontinued     1,000 mg 200 mL/hr over 60 Minutes Intravenous  Once 08/26/16 0645 08/26/16 0844       Assessment/Plan Cholelithiasis  RUQ pain  - HIDA scan with no gallbladder activity; appreciate GI recommendations, No indication for MRCP.  - cholestasis vs calculous cholecystitis:  Given presentation of acute pain that is different than previous abdominal pain, and persistent pain today on exam, cannot completely exclude acute cholecystitis, however patient has no fever/leukocytosis and patient symptoms are also  attributable to hepatocellular abnormality, making gallbladder symptoms/inflammation reactive. Discussed treatment options with the patient, including percutaneous cholecystostomy tube and temporizing possible cholecystitis with antibiotics. Patient wishes to proceed with antibiotic treatment and avoid any procedures at this time. -Agree with GI recommendation to keep patient in hospital for pain control and repeat labs for another 24+ hours.  - Recommend discharge on PO Augmentin for 10 days. Patient has a history of regular cipro use due to UTI's.   Breast Cancer with metastasis - appreciate Dr. Jana Hakim recs Jaundice  Hyperbilirubinemia - possibly secondary to hepatocellular injury on keytruda, no CBD dilatation  Elevated LFT's   FEN - NPO, IVF ID-  Ziosyn 5/29 >> VTE - SCD's, SQ heparin    LOS: 1 day    Jill Alexanders , [facility] 08/27/2016, 8:44 AM Pager: [phone] Consults: [phone] Mon-Fri 7:00 am-4:30 pm Sat-Sun 7:00 am-11:30 am

## 2016-08-27 NOTE — Progress Notes (Signed)
[facility] Progress Note  Chief Complaint: Right Upper quadrant pain  Subjective  History:  Coretta continues to have right upper quadrant pain that is tolerable at rest with Toradol, but worsened when she is up and moving around. It is also worsened with inspiration. She is fearful of taking opiate analgesics because they have caused severe constipation in the past. She feels this would be a setback for her versus just continuing the Toradol. She is upset this morning, with confusion about the diagnosis and plan. Her oncologist felt that perhaps she was ready for discharge, but she is very anxious about that. She is concerned that she will have pain at home that might be difficult to control. She is also concerned that the bilirubin has not yet peaked. She was concerned that there was talk of perhaps doing some kind of interventional radiology treatment on some of the tumor burden, because there was concerned it might be causing a complete extrahepatic biliary obstruction. However, she feels confused because she was told with a high bilirubin she could not have those treatments.  ROS: Cardiovascular:  no chest pain Respiratory: no dyspnea  Objective:  Med list reviewed  Vital signs in last 24 hrs: Vitals:   08/26/16 2031 08/27/16 0500  BP: 128/75 119/81  Pulse: 88 83  Resp: 16 18  Temp: 97.9 F (36.6 C) 97.6 F (36.4 C)    Physical Exam She is alert, conversational, visibly anxious, but well spoken and lucid. Her husband is at the bedside.  HEENT: sclera icteric, oral mucosa moist without lesions  Neck: supple, no thyromegaly, JVD or lymphadenopathy  Cardiac: RRR without murmurs, S1S2 heard, no peripheral edema  Pulm: clear to auscultation bilaterally, normal RR and effort noted  Abdomen: soft, right upper quadrant tenderness, with active bowel sounds. It is difficult to tell if she has hepatomegaly given the exam is limited by her pain. She clearly shows the pain around to the  right side of the rib cage into the back. It is not tender on the rib cage.  Skin; warm and dry, no jaundice or rash  Recent Labs:   Recent Labs Lab 08/26/16 0300 08/26/16 0359 08/27/16 0404  WBC 7.4  --  4.8  HGB 11.6* 12.2 11.1*  HCT 33.0* 36.0 30.4*  PLT 542*  --  466*    Recent Labs Lab 08/27/16 0404  NA 135  K 3.7  CL 103  CO2 23  BUN 7  ALBUMIN 2.8*  ALKPHOS 522*  ALT 341*  AST 313*  GLUCOSE 87    Recent Labs Lab 08/27/16 0404  INR 1.07    Radiologic studies: HIDA scan reports reviewed. See my brief progress note from yesterday afternoon. Essentially, the tracer never left the liver. The radiologist felt that perhaps this was consistent with a complete proximal extrahepatic biliary obstruction and an MRCP should be considered.  @ASSESSMENTPLANBEGIN @ Assessment:  Right upper quadrant pain Jaundice that I believe is from metastatic tumor burden and superimposed drug-induced liver injury from recent chemotherapeutic regimen. Gallstones. While she does have some gallbladder wall thickening, I think the overall clinical scenario favors this being reactive from the adjacent liver congestion rather than true acute cholecystitis. Metastatic breast cancer  It is a complicated and somewhat puzzling scenario. I had a long discussion with her, her husband, and her internal medicine physician as well as surgical consultant. We have no further testing to give [place] clarity on whether or not she truly has acute cholecystitis. However, I think the  overall clinical scenario does not favor that. To be on the safe side, I would treat her with 7-10 days of a total antibiotic course. Her bilirubin only rose slightly from yesterday to today, and I'm hopeful that this represents a plateau and peak of the bilirubin very soon. I think she should be kept for further inpatient observation so we can follow her liver labs: INR, and keep her pain under control.  If by tomorrow it seems  clear her liver labs have plateaued, then I think and have more comfort discharging her home as long as there is a plan to keep her pain under control.  Total time 35 minutes, over half spent in discussions with the patient, her family and other providers.  Nelida Meuse III Pager [phone] Mon-Fri 8a-5p [phone] after 5p, weekends, holidays

## 2016-08-28 DIAGNOSIS — C7951 Secondary malignant neoplasm of bone: Secondary | ICD-10-CM

## 2016-08-28 LAB — COMPREHENSIVE METABOLIC PANEL
ALK PHOS: 565 U/L — AB (ref 38–126)
ALT: 346 U/L — ABNORMAL HIGH (ref 14–54)
AST: 342 U/L — ABNORMAL HIGH (ref 15–41)
Albumin: 2.9 g/dL — ABNORMAL LOW (ref 3.5–5.0)
Anion gap: 9 (ref 5–15)
BILIRUBIN TOTAL: 14.1 mg/dL — AB (ref 0.3–1.2)
BUN: 6 mg/dL (ref 6–20)
CALCIUM: 7.8 mg/dL — AB (ref 8.9–10.3)
CO2: 22 mmol/L (ref 22–32)
Chloride: 103 mmol/L (ref 101–111)
Creatinine, Ser: 0.3 mg/dL — ABNORMAL LOW (ref 0.44–1.00)
GFR calc non Af Amer: >60 mL/min (ref >60)
Glucose, Bld: 103 mg/dL — ABNORMAL HIGH (ref 65–99)
POTASSIUM: 3.9 mmol/L (ref 3.5–5.1)
Sodium: 134 mmol/L — ABNORMAL LOW (ref 135–145)
TOTAL PROTEIN: 6.1 g/dL — AB (ref 6.5–8.1)

## 2016-08-28 LAB — PROTIME-INR
INR: 1.07
PROTHROMBIN TIME: 13.9 s (ref 11.4–15.2)

## 2016-08-28 MED ORDER — NAPROXEN 500 MG PO TABS
250.0000 mg | ORAL_TABLET | Freq: Three times a day (TID) | ORAL | Status: DC
  Administered 2016-08-28 – 2016-08-30 (×9): 250 mg via ORAL
  Filled 2016-08-28 (×9): qty 1

## 2016-08-28 MED ORDER — ACETAMINOPHEN 500 MG PO TABS
500.0000 mg | ORAL_TABLET | Freq: Three times a day (TID) | ORAL | Status: DC
  Administered 2016-08-28 – 2016-08-30 (×9): 500 mg via ORAL
  Filled 2016-08-28 (×9): qty 1

## 2016-08-28 MED ORDER — FAMOTIDINE 20 MG PO TABS
20.0000 mg | ORAL_TABLET | Freq: Two times a day (BID) | ORAL | Status: DC | PRN
  Administered 2016-08-29 (×2): 20 mg via ORAL
  Filled 2016-08-28 (×2): qty 1

## 2016-08-28 MED ORDER — PANCRELIPASE (LIP-PROT-AMYL) 12000-38000 UNITS PO CPEP: 12000.0000 [IU] | ORAL_CAPSULE | Freq: Three times a day (TID) | ORAL | Status: DC

## 2016-08-28 MED ORDER — PANCRELIPASE (LIP-PROT-AMYL) 12000-38000 UNITS PO CPEP
12000.0000 [IU] | ORAL_CAPSULE | Freq: Three times a day (TID) | ORAL | Status: DC
  Administered 2016-08-28: 12000 [IU] via ORAL
  Filled 2016-08-28: qty 1

## 2016-08-28 NOTE — Progress Notes (Signed)
[name] [name] is generally more comfortable, a bit more ambulatory, and able to tolerate food better. However she gets easily bloated and her pain is only just barely controlled on tramadol every 6 hours.  I'm going to try to transition her to naproxen plus Tylenol 4 times a day. I'm going to add Creon before meals to see if this helps her digestion.  Her bilirubin is 14.1 today. I am hopeful she will be able to go home tomorrow.  We'll continue to follow closely with you.

## 2016-08-28 NOTE — Plan of Care (Signed)
Problem: Activity: Goal: Risk for activity intolerance will decrease Outcome: Completed/Met Date Met: 08/28/16 Patient ambulated in the hallway,tolerated the activity.

## 2016-08-28 NOTE — Progress Notes (Signed)
PROGRESS NOTE  [name]:[id number] DOB: 11-06-1960 DOA: 08/26/2016 PCP: Chauncey Cruel, MD   LOS: 2 days   Brief Narrative: [name] [name]  is a 56 y.o. 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 Jana Hakim, GERD, anxiety who comes in with one-day history of sharp right upper quadrant abdominal pain which is nonradiating, associated with mild nausea.  She was found to have significantly elevated LFTs, gastroenterology, general surgery and oncology were consulted.  Assessment & Plan: Principal Problem:   RUQ pain Active Problems:   Malignant neoplasm of upper-inner quadrant of right breast in female, estrogen receptor positive (HCC)   Abnormal LFTs (liver function tests)   Bone metastases (HCC)   Malignant pleural effusion, left   Liver metastases (HCC)   Cholelithiasis   Jaundice   Elevated liver function tests -Somewhat of a not clear story as to what is causing this elevated LFTs.  Ultrasound was suspicious for acute cholecystitis and a HIDA scan did not show any tracer into the gallbladder or into the duodenum. -Discussed with Dr. Loletha Carrow, this is most likely due to [facility] chemotherapy rather than cholecystitis, and recommendations are against percutaneous cholecystostomy or cholecystectomy.  General surgery signed off on 5/30. -Unfortunately bilirubin continued to rise and is at 14 today.  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 Jana Hakim today   Anxiety -ativan PRN  Anemia -likely in the setting of malignancy   DVT prophylaxis: heparin Code Status: Full code Family Communication: husband at bedside    Disposition Plan: home 1 day if LFTs stable  Consultants:   GI  General surgery  Oncology  Procedures:   None   Antimicrobials:   Zosyn 5/29 >>  Subjective: -quite upset that her bili is trending up. Complains of abdominal bloating, no nausea/vomiting. No diarrhea but loose stools  Objective: Vitals:   08/27/16 0500 08/27/16 1345 08/27/16 2041 08/28/16 0430  BP: 119/81 123/71 (!) 143/83 139/84  Pulse: 83 83 83 88  Resp: 18 16 16 16   Temp: 97.6 F (36.4 C) 98.2 F (36.8 C) 98.1 F (36.7 C) 97.5 F (36.4 C)  TempSrc: Oral Oral Oral Oral  SpO2: 99% 100% 100% 98%  Weight:        Intake/Output Summary (Last 24 hours) at 08/28/16 1337 Last data filed at 08/28/16 1250  Gross per 24 hour  Intake              630 ml  Output             2850 ml  Net            -2220 ml   Filed Weights   08/27/16 0116  Weight: 78.9 kg (174 lb)    Examination:  Vitals:   08/27/16 0500 08/27/16 1345 08/27/16 2041 08/28/16 0430  BP: 119/81 123/71 (!) 143/83 139/84  Pulse: 83 83 83 88  Resp: 18 16 16 16   Temp: 97.6 F (36.4 C) 98.2 F (36.8 C) 98.1 F (36.7 C) 97.5 F (36.4 C)  TempSrc: Oral Oral Oral Oral  SpO2: 99% 100% 100% 98%  Weight:       Constitutional: NAD, calm, comfortable  Eyes:  lids and conjunctivae icteric Respiratory: clear to auscultation bilaterally, no wheezing, no crackles. Normal respiratory effort.  Cardiovascular: Regular rate and rhythm, no murmurs / rubs / gallops. No LE edema. 2+ pedal pulses.  Abdomen: no tenderness. Bowel sounds positive.  Skin: no rashes, lesions, ulcers. No induration. Icteric Neurologic: non focal   Data Reviewed: I have personally reviewed following labs and imaging studies  CBC:  Recent Labs Lab 08/26/16 0300 08/26/16 0359 08/27/16 0404  WBC 7.4  --  4.8  NEUTROABS 6.0  --   --   HGB 11.6* 12.2 11.1*  HCT 33.0* 36.0 30.4*  MCV 102.5*  --  100.3*  PLT 542*  --  400*   Basic Metabolic Panel:  Recent Labs Lab  08/26/16 0300 08/26/16 0359 08/27/16 0404 08/28/16 0721  NA 129* 131* 135 134*  K 3.8 3.9 3.7 3.9  CL 98* 98* 103 103  CO2 22  --  23 22  GLUCOSE 123* 121* 87 103*  BUN 6 4* 7 6  CREATININE 0.34* 0.50 0.31* 0.30*  CALCIUM 8.0*  --  7.6* 7.8*   GFR: Estimated Creatinine Clearance: 79.8 mL/min (A) (by C-G formula based on SCr of 0.3 mg/dL (L)). Liver Function Tests:  Recent Labs Lab 08/26/16 0300 08/27/16 0404 08/28/16 0721  AST 306* 313* 342*  ALT 370* 341* 346*  ALKPHOS 542* 522* 565*  BILITOT 12.5* 13.0* 14.1*  PROT 6.8 6.1* 6.1*  ALBUMIN 3.2* 2.8* 2.9*    Recent Labs Lab 08/26/16 0300  LIPASE 29   No results for input(s): AMMONIA in the last 168 hours. Coagulation Profile:  Recent Labs Lab 08/27/16 0404 08/28/16 0721  INR 1.07 1.07   Cardiac Enzymes: No results for input(s): CKTOTAL, CKMB, CKMBINDEX, TROPONINI in the last 168 hours. BNP (last 3 results) No results for input(s): PROBNP in the last 8760 hours. HbA1C: No results for input(s): HGBA1C in the last 72 hours. CBG: No results for input(s): GLUCAP in the last 168 hours. Lipid Profile: No results for input(s): CHOL, HDL, LDLCALC, TRIG, CHOLHDL, LDLDIRECT in the last 72 hours. Thyroid Function Tests: No results for input(s): TSH, T4TOTAL, FREET4, T3FREE, THYROIDAB in the last 72 hours. Anemia Panel: No results for input(s): VITAMINB12, FOLATE, FERRITIN, TIBC, IRON, RETICCTPCT in the last 72 hours. Urine analysis:    Component Value Date/Time   COLORURINE AMBER (A) 08/26/2016 0334   APPEARANCEUR CLEAR 08/26/2016 0334   LABSPEC 1.015 08/26/2016 0334   LABSPEC 1.005 05/16/2016 1201   PHURINE 5.0 08/26/2016 0334   GLUCOSEU NEGATIVE 08/26/2016 0334   GLUCOSEU Negative 05/16/2016 1201   HGBUR SMALL (A) 08/26/2016 0334   BILIRUBINUR MODERATE (A) 08/26/2016 0334   BILIRUBINUR Negative 05/16/2016 1201   KETONESUR 20 (A) 08/26/2016 0334   PROTEINUR NEGATIVE 08/26/2016 0334   UROBILINOGEN 0.2  05/16/2016 1201   NITRITE NEGATIVE 08/26/2016 0334   LEUKOCYTESUR NEGATIVE 08/26/2016 0334   LEUKOCYTESUR Small 05/16/2016 1201   Sepsis Labs: Invalid input(s): PROCALCITONIN, LACTICIDVEN  No results found for this or any previous visit (from the past 240 hour(s)).    Radiology Studies: Nm Hepatobiliary Liver Func  Result Date: 08/26/2016 CLINICAL DATA:  Right upper quadrant pain EXAM: NUCLEAR MEDICINE HEPATOBILIARY IMAGING VIEWS: Anterior right upper quadrant RADIOPHARMACEUTICALS:  7.9 mCi Tc-85m  Choletec IV COMPARISON:  Ultrasound right upper quadrant Aug 15, 2016 FINDINGS: Images were obtained serially over a 3 hour time span. Morphine was not administered due to history of allergic reaction to morphine. Liver uptake of radiotracer is  inhomogeneous consistent with known liver metastases. Over a 3 hour time span, there is no appreciable visualization of either gallbladder or small bowel. IMPRESSION: Nonvisualization of gallbladder over a 3 hour time span is suggestive of cystic duct obstruction/ acute cholecystitis. Note, however, that there is also nonvisualization of small bowel after 3 hours. This finding potentially could be indicative of common bile duct obstruction. However, this finding also could be indicative of hepatic stasis phenomenon. Serum bilirubin is moderately elevated at this time. Known liver metastases demonstrated by inhomogeneous uptake of radiotracer. Given lack of visualization of both gallbladder and small bowel, it may be prudent to consider MRCP to assess for possible common bile duct lesion as the cause for lack of small bowel visualization on this study. Electronically Signed   By: Lowella Grip III M.D.   On: 08/26/2016 16:13     Scheduled Meds: . acetaminophen  500 mg Oral TID WC & HS  . famotidine  40 mg Oral Daily  . heparin  5,000 Units Subcutaneous Q8H  . naproxen  250 mg Oral TID WC & HS   Continuous Infusions: . sodium chloride 75 mL/hr at 08/26/16  1506  . piperacillin-tazobactam (ZOSYN)  IV 3.375 g (08/28/16 0847)     Time spent: 35 minutes, more than 50% bedside discussing with patient and her husband, and in d/w with Dr. Loletha Carrow and Dr. Jana Hakim on the floor for care coordination    Marzetta Board, MD, PhD [facility] Pager [phone]  If 7PM-7AM, please contact night-coverage [email or web address] Password [email or web address] 08/28/2016, 1:37 PM

## 2016-08-28 NOTE — Progress Notes (Signed)
[facility] Progress Note  Chief Complaint: Jaundice  Subjective  History:  [name] [name] is largely unchanged from yesterday, with continued right upper quadrant pain that is worse with inspiration and movement. Her appetite remains fair. She has postprandial bloating for the last few weeks. She was started on pancreatic enzyme for this today, but I have discontinued it because I do not believe it will be helpful for the problem she has.  ROS: Cardiovascular:  no chest pain Respiratory: no dyspnea  Objective:  Med list reviewed  Vital signs in last 24 hrs: Vitals:   08/27/16 2041 08/28/16 0430  BP: (!) 143/83 139/84  Pulse: 83 88  Resp: 16 16  Temp: 98.1 F (36.7 C) 97.5 F (36.4 C)    Physical Exam   HEENT: sclera icteric, oral mucosa moist without lesions  Neck: supple, no thyromegaly, JVD or lymphadenopathy  Cardiac: RRR without murmurs, S1S2 heard, no peripheral edema  Pulm: clear to auscultation bilaterally, normal RR and effort noted  Abdomen: soft, right upper quadrant tenderness, with active bowel sounds. No guarding or palpable hepatosplenomegaly  Skin; warm and dry, deeply jaundiced  Recent Labs:   Recent Labs Lab 08/26/16 0300 08/26/16 0359 08/27/16 0404  WBC 7.4  --  4.8  HGB 11.6* 12.2 11.1*  HCT 33.0* 36.0 30.4*  PLT 542*  --  466*    Recent Labs Lab 08/28/16 0721  NA 134*  K 3.9  CL 103  CO2 22  BUN 6  ALBUMIN 2.9*  ALKPHOS 565*  ALT 346*  AST 342*  GLUCOSE 103*    Recent Labs Lab 08/28/16 0721  INR 1.07    Radiologic studies: I looked  through the reports from her last 2 MRIs of the liver. In addition to multiple liver lesions, there is also tumor or adenopathy at the porta hepatis. There was no mention of intrahepatic biliary ductal dilatation on the ultrasound this admission.  @ASSESSMENTPLANBEGIN @ Assessment:  Jaundice. I think it is most likely drug-induced liver injury from her recent chemotherapeutic regimen on top of  the liver that has a large tumor burden. Metastatic breast cancer Right upper quadrant pain, probably from liver metastasis, less likely cholecystitis. We are treating empirically for possible cholecystitis but opted not to do surgery or drainage tube because the clinical suspicion for cholecystitis was not high enough to warrant that. She is not in fulminant hepatic failure since her INR is normal. Upon further consideration, and in light of previous imaging reports, it is possible that she has a high-grade obstruction at the common hepatic duct/porta hepatis. That should cause markedly intrahepatic biliary ductal dilatation, perhaps that was difficult to appreciate on the ultrasound given the heterogeneous appearance of the liver from metastasis.  The HIDA scan results could be consistent with either intrahepatic cholestasis or high-grade proximal extrahepatic biliary obstruction.  Plan:  I had a lengthy visit with [name] [name] today, and spent a total of 45 minutes time in all chart review and in discussion with her.  LFTs and INR tomorrow MRI of the liver tomorrow morning  Nelida Meuse III Pager [phone] Mon-Fri 8a-5p [phone] after 5p, weekends, holidays

## 2016-08-29 ENCOUNTER — Other Ambulatory Visit: Payer: 59

## 2016-08-29 ENCOUNTER — Ambulatory Visit: Payer: 59

## 2016-08-29 ENCOUNTER — Inpatient Hospital Stay (HOSPITAL_COMMUNITY): Payer: 59

## 2016-08-29 ENCOUNTER — Ambulatory Visit: Payer: 59 | Admitting: Oncology

## 2016-08-29 ENCOUNTER — Encounter: Payer: 59 | Admitting: Physical Therapy

## 2016-08-29 DIAGNOSIS — C50919 Malignant neoplasm of unspecified site of unspecified female breast: Secondary | ICD-10-CM

## 2016-08-29 LAB — PROTIME-INR
INR: 1.11
Prothrombin Time: 14.4 seconds (ref 11.4–15.2)

## 2016-08-29 LAB — COMPREHENSIVE METABOLIC PANEL
ALBUMIN: 2.7 g/dL — AB (ref 3.5–5.0)
ALK PHOS: 519 U/L — AB (ref 38–126)
ALT: 321 U/L — AB (ref 14–54)
AST: 324 U/L — ABNORMAL HIGH (ref 15–41)
Anion gap: 9 (ref 5–15)
BUN: <5 mg/dL — ABNORMAL LOW (ref 6–20)
CALCIUM: 7.7 mg/dL — AB (ref 8.9–10.3)
CO2: 22 mmol/L (ref 22–32)
CREATININE: 0.33 mg/dL — AB (ref 0.44–1.00)
Chloride: 104 mmol/L (ref 101–111)
GFR calc Af Amer: >60 mL/min (ref >60)
GFR calc non Af Amer: >60 mL/min (ref >60)
GLUCOSE: 97 mg/dL (ref 65–99)
Potassium: 3.7 mmol/L (ref 3.5–5.1)
SODIUM: 135 mmol/L (ref 135–145)
Total Bilirubin: 14.5 mg/dL — ABNORMAL HIGH (ref 0.3–1.2)
Total Protein: 5.8 g/dL — ABNORMAL LOW (ref 6.5–8.1)

## 2016-08-29 MED ORDER — GADOBENATE DIMEGLUMINE 529 MG/ML IV SOLN
20.0000 mL | Freq: Once | INTRAVENOUS | Status: AC | PRN
  Administered 2016-08-29: 16 mL via INTRAVENOUS

## 2016-08-29 MED ORDER — POLYETHYLENE GLYCOL 3350 17 G PO PACK
17.0000 g | PACK | Freq: Every day | ORAL | Status: DC | PRN
  Administered 2016-08-29: 17 g via ORAL
  Filled 2016-08-29: qty 1

## 2016-08-29 MED ORDER — DEXAMETHASONE SODIUM PHOSPHATE 4 MG/ML IJ SOLN
4.0000 mg | Freq: Four times a day (QID) | INTRAMUSCULAR | Status: DC
  Administered 2016-08-29 – 2016-08-30 (×4): 4 mg via INTRAVENOUS
  Filled 2016-08-29 (×4): qty 1

## 2016-08-29 MED ORDER — LORAZEPAM 2 MG/ML IJ SOLN
0.5000 mg | INTRAMUSCULAR | Status: DC | PRN
  Administered 2016-08-29 – 2016-08-30 (×3): 0.5 mg via INTRAVENOUS
  Filled 2016-08-29 (×4): qty 1

## 2016-08-29 MED ORDER — KETOROLAC TROMETHAMINE 15 MG/ML IJ SOLN: 30.0000 mg | Freq: Four times a day (QID) | INTRAMUSCULAR | Status: DC | PRN

## 2016-08-29 MED ORDER — PROCHLORPERAZINE EDISYLATE 5 MG/ML IJ SOLN: 5.0000 mg | Freq: Four times a day (QID) | INTRAMUSCULAR | Status: DC | PRN

## 2016-08-29 NOTE — Progress Notes (Signed)
I have personally reviewed the MRI/MRCP images with Drs. Abigail Miyamoto and Dr. Ronny Bacon of radiology. Dr. Kathrynn Humble read the exam, as he has done for all of these his prior MR studies. Dr. Pascal Lux is familiar with [name] [name]'s case and is also GI doctor on call today and this weekend.  The exam shows progression of her metastatic disease throughout the liver. There is a high-grade obstruction of the right intrahepatic duct over a length of at least 4 cm. This is from either parenchymal metastatic disease or nodal metastasis at the porta hepatis extending into the right intrahepatic system. In either case, it appears to be completely blocked with tumor with proximal bilateral intrahepatic biliary ductal dilatation. The common bile duct is unobstructed and of normal caliber.  I do not believe that this lesion is amenable to ERCP and internal stent, even in the hands of an advanced biliary endoscopist. Even if it were somehow feasible to get a wire access across this very long tight area of tumor into the more proximal right sided intrahepatic ductal system to place a metal stent, I believe it is only a matter of short time before the progression of intrahepatic metastatic disease would occlude the proximal portion of any such stent. Given the progression of her metastatic disease, it is uncertain how much any kind of biliary drainage would decrease her bilirubin level.  Dr. Pascal Lux feels that it might be feasible to place a percutaneous biliary drain in the left lobe of the liver, but it seems doubtful he could access the common bile duct or duodenum. This would mean an indefinite external biliary drain. Again, it is uncertain how much decrease in the jaundice this would provide given the extensive metastatic disease in the liver.  In sum, we need to have a long discussion with [name] [name], her husband and her oncologist about her goals of care. Dr. Pascal Lux is agreeable to seeing [name] [name] this weekend as best as his availability will  allow to discuss the percutaneous option. She would also need to discuss this with her oncologist to understand what if any other systemic therapy is available and whether or not it is time to discuss hospice.  I was unable to reach Dr. Jana Hakim this afternoon. I will be at another hospital doing procedures until later this evening, so I will see [name] [name] tomorrow. I conveyed these findings and my thoughts to Dr. Letta Median so he could at least give [name] [name] the results of the scan.

## 2016-08-29 NOTE — Progress Notes (Signed)
PROGRESS NOTE  RANEEN JAFFER UMP:[id number] DOB: 08/02/1960 DOA: 08/26/2016 PCP: Chauncey Cruel, MD   LOS: 3 days   Brief Narrative: [place] Zou  is a 56 y.o. 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 Jana Hakim, GERD, anxiety who comes in with one-day history of sharp right upper quadrant abdominal pain which is nonradiating, associated with mild nausea.  She was found to have significantly elevated LFTs, gastroenterology, general surgery and oncology were consulted.  Assessment & Plan: Principal Problem:   RUQ pain Active Problems:   Malignant neoplasm of upper-inner quadrant of right breast in female, estrogen receptor positive (HCC)   Abnormal LFTs (liver function tests)   Bone metastases (HCC)   Malignant pleural effusion, left   Liver metastases (HCC)   Cholelithiasis   Jaundice   Elevated liver function tests -Somewhat of a not clear story as to what is causing this elevated LFTs.  Ultrasound was suspicious for acute cholecystitis and a HIDA scan did not show any tracer into the gallbladder or into the duodenum. -Unfortunately bilirubin continued to rise -obtained MRI/MRCP today which shows progression of the hepatic metastasis with intrahepatic biliary duct dilatation due to progressive tumor.  -d/w Dr. Loletha Carrow, Dr/ Magrinat findings, images personally reviewed and reviewed in room with patient and her husband  Metastatic stage IV breast cancer with metastases to skull, left pleura and liver -with liver mets progression and biliary obstruction  Anxiety -ativan PRN  Anemia -likely in the setting of malignancy   DVT prophylaxis: heparin Code Status: Full code Family Communication: husband at bedside  Disposition Plan: home when ready   Consultants:   GI  General surgery  Oncology  Procedures:   None   Antimicrobials:    Zosyn 5/29 >>  Subjective: -quite upset that her bili is trending up. Complains of abdominal bloating, no nausea/vomiting. No diarrhea but loose stools  Objective: Vitals:   08/28/16 1438 08/28/16 2045 08/29/16 0600 08/29/16 1330  BP: (!) 144/86 (!) 142/83 131/83 (!) 142/90  Pulse: 88 92 88 80  Resp: 18 16 20 16   Temp: 97.6 F (36.4 C) 97.7 F (36.5 C) 97.7 F (36.5 C) 97.8 F (36.6 C)  TempSrc: Oral Oral Oral Oral  SpO2: 100% 99% 99% 99%  Weight:        Intake/Output Summary (Last 24 hours) at 08/29/16 1755 Last data filed at 08/29/16 1036  Gross per 24 hour  Intake              240 ml  Output             3200 ml  Net            -2960 ml   Filed Weights   08/27/16 0116  Weight: 78.9 kg (174 lb)    Examination:  Vitals:   08/28/16 1438 08/28/16 2045 08/29/16 0600 08/29/16 1330  BP: (!) 144/86 (!) 142/83 131/83 (!) 142/90  Pulse: 88 92 88 80  Resp: 18 16 20 16   Temp: 97.6 F (36.4 C) 97.7 F (36.5 C) 97.7 F (36.5 C) 97.8 F (36.6 C)  TempSrc: Oral Oral Oral Oral  SpO2: 100% 99% 99% 99%  Weight:       Constitutional: NAD, calm, comfortable Eyes: lids and conjunctivae icteric ENMT: Mucous membranes are moist.  Neck: normal, supple Respiratory: clear to auscultation bilaterally, no wheezing, no crackles. Normal respiratory effort.  Cardiovascular: Regular rate and rhythm, no murmurs / rubs / gallops. No LE edema.  Abdomen: no tenderness. Bowel sounds positive.    Data Reviewed: I have personally reviewed following labs and imaging studies  CBC:  Recent Labs Lab 08/26/16 0300 08/26/16 0359 08/27/16 0404  WBC 7.4  --  4.8  NEUTROABS 6.0  --   --   HGB 11.6* 12.2 11.1*  HCT 33.0* 36.0 30.4*  MCV 102.5*  --  100.3*  PLT 542*  --  295*   Basic Metabolic Panel:  Recent Labs Lab 08/26/16 0300 08/26/16 0359 08/27/16 0404 08/28/16 0721 08/29/16 0508  NA 129* 131* 135 134* 135  K 3.8 3.9 3.7 3.9 3.7  CL 98* 98* 103 103 104  CO2 22  --  23 22 22    GLUCOSE 123* 121* 87 103* 97  BUN 6 4* 7 6 <5*  CREATININE 0.34* 0.50 0.31* 0.30* 0.33*  CALCIUM 8.0*  --  7.6* 7.8* 7.7*   GFR: Estimated Creatinine Clearance: 79.8 mL/min (A) (by C-G formula based on SCr of 0.33 mg/dL (L)). Liver Function Tests:  Recent Labs Lab 08/26/16 0300 08/27/16 0404 08/28/16 0721 08/29/16 0508  AST 306* 313* 342* 324*  ALT 370* 341* 346* 321*  ALKPHOS 542* 522* 565* 519*  BILITOT 12.5* 13.0* 14.1* 14.5*  PROT 6.8 6.1* 6.1* 5.8*  ALBUMIN 3.2* 2.8* 2.9* 2.7*    Recent Labs Lab 08/26/16 0300  LIPASE 29   No results for input(s): AMMONIA in the last 168 hours. Coagulation Profile:  Recent Labs Lab 08/27/16 0404 08/28/16 0721 08/29/16 0508  INR 1.07 1.07 1.11   Cardiac Enzymes: No results for input(s): CKTOTAL, CKMB, CKMBINDEX, TROPONINI in the last 168 hours. BNP (last 3 results) No results for input(s): PROBNP in the last 8760 hours. HbA1C: No results for input(s): HGBA1C in the last 72 hours. CBG: No results for input(s): GLUCAP in the last 168 hours. Lipid Profile: No results for input(s): CHOL, HDL, LDLCALC, TRIG, CHOLHDL, LDLDIRECT in the last 72 hours. Thyroid Function Tests: No results for input(s): TSH, T4TOTAL, FREET4, T3FREE, THYROIDAB in the last 72 hours. Anemia Panel: No results for input(s): VITAMINB12, FOLATE, FERRITIN, TIBC, IRON, RETICCTPCT in the last 72 hours. Urine analysis:    Component Value Date/Time   COLORURINE AMBER (A) 08/26/2016 0334   APPEARANCEUR CLEAR 08/26/2016 0334   LABSPEC 1.015 08/26/2016 0334   LABSPEC 1.005 05/16/2016 1201   PHURINE 5.0 08/26/2016 0334   GLUCOSEU NEGATIVE 08/26/2016 0334   GLUCOSEU Negative 05/16/2016 1201   HGBUR SMALL (A) 08/26/2016 0334   BILIRUBINUR MODERATE (A) 08/26/2016 0334   BILIRUBINUR Negative 05/16/2016 1201   KETONESUR 20 (A) 08/26/2016 0334   PROTEINUR NEGATIVE 08/26/2016 0334   UROBILINOGEN 0.2 05/16/2016 1201   NITRITE NEGATIVE 08/26/2016 0334    LEUKOCYTESUR NEGATIVE 08/26/2016 0334   LEUKOCYTESUR Small 05/16/2016 1201   Sepsis Labs: Invalid input(s): PROCALCITONIN, LACTICIDVEN  No results found for this or any previous visit (from the past 240 hour(s)).    Radiology Studies: Mr 3d Recon At Scanner  Result Date: 08/29/2016 CLINICAL DATA:  Metastatic breast cancer with liver metastasis. Evaluate for metastasis versus biliary obstruction. New chemotherapy. Jaundice. EXAM: MRI ABDOMEN WITHOUT AND WITH CONTRAST (INCLUDING MRCP) TECHNIQUE: Multiplanar multisequence MR imaging of the abdomen was performed both before and after the administration of intravenous contrast. Heavily T2-weighted images of the biliary and pancreatic ducts were obtained, and three-dimensional MRCP images were rendered by post processing. CONTRAST:  83mL MULTIHANCE GADOBENATE DIMEGLUMINE 529  MG/ML IV SOLN COMPARISON:  Ultrasound 08/26/2016.  Most recent MRI of 07/24/2016. FINDINGS: Portions of exam are minimally motion degraded. Lower chest: Mild cardiomegaly. Left-sided pleural thickening remains. Index enhancing left pleural implant measures 1.4 cm on image 19/ series 11001. Compare 1.5 cm on the prior exam. More anterior implant measures 1.5 cm on image 20/ series 1101 compare 1.4 cm on the prior. Hepatobiliary: Extensive hepatic metastasis. Overall mild to (given short interval ) moderate progression. Example central right hepatic lobe lesion at 3.3 x 3.8 cm on image 47/ series 11001. Compare 2.8 x 3.5 cm on the prior. Lateral segment left liver lobe lesion measures 3.1 cm on image 49/ series 11001. Compare 3.0 cm on the prior. A pericholecystic right liver lobe lesion measures 4.7 x 4.0 cm on image 60/ series 11001. Compare 3.8 x 2.9 cm on the prior. Interval development of moderate intrahepatic biliary duct dilatation bilaterally. This continues to the level of the porta hepatis. Example image 89/series 4 and image 80/series 400. Amorphous soft tissue signal in this  region is likely the cause, secondary to extension of porta hepatis infiltrative adenopathy. Example image 31/series 3 and image 49/ series 11002. A gallstone is again identified. There is moderate gallbladder wall thickening on image 36/series 3. The distal common duct is normal in caliber. Pancreas:  Normal, without mass or ductal dilatation. Spleen:  Normal in size, without focal abnormality. Adrenals/Urinary Tract: Normal adrenal glands. Normal kidneys, without hydronephrosis. Stomach/Bowel: Normal stomach and abdominal bowel loops. Vascular/Lymphatic: Aortic atherosclerosis. Circumaortic left renal vein. Porta hepatis adenopathy is progressive as detailed above. Example component in the portal caval space at 1.6 cm on image 31/series 3. Compare 1.4 cm on the prior. Progressive retroperitoneal adenopathy. A new node in the aortocaval space measures 1.3 cm on image 39/series 3. Other:  Trace perihepatic ascites. Musculoskeletal: Re- demonstration of osseous metastasis. Example lesion with the T11 vertebral body which measures 1.5 cm on image 22/series 12. Compare 1.4 cm on the prior. The inferior L3 lesion measures 2.8 x 1.6 cm on image 28/series 12 versus 2.9 x 1.8 cm on the prior. IMPRESSION: 1. Progression of hepatic metastasis. 2. Progressive adenopathy within the porta hepatis with increased retroperitoneal nodal metastasis. 3. Development of moderate intrahepatic biliary duct dilatation, secondary to soft tissue fullness in the porta hepatis, favored to related to infiltrative progressive adenopathy. Normal caliber common duct distally. 4. Cholelithiasis. Gallbladder wall thickening is nonspecific. Please see prior ultrasound. 5. Similar osseous and left pleural metastasis. Findings were reported to Dr. Loletha Carrow at 1:35 p.m. Electronically Signed   By: Abigail Miyamoto M.D.   On: 08/29/2016 13:36   Mr Abdomen Mrcp Moise Boring Contast  Result Date: 08/29/2016 CLINICAL DATA:  Metastatic breast cancer with liver  metastasis. Evaluate for metastasis versus biliary obstruction. New chemotherapy. Jaundice. EXAM: MRI ABDOMEN WITHOUT AND WITH CONTRAST (INCLUDING MRCP) TECHNIQUE: Multiplanar multisequence MR imaging of the abdomen was performed both before and after the administration of intravenous contrast. Heavily T2-weighted images of the biliary and pancreatic ducts were obtained, and three-dimensional MRCP images were rendered by post processing. CONTRAST:  44mL MULTIHANCE GADOBENATE DIMEGLUMINE 529 MG/ML IV SOLN COMPARISON:  Ultrasound 08/26/2016.  Most recent MRI of 07/24/2016. FINDINGS: Portions of exam are minimally motion degraded. Lower chest: Mild cardiomegaly. Left-sided pleural thickening remains. Index enhancing left pleural implant measures 1.4 cm on image 19/ series 11001. Compare 1.5 cm on the prior exam. More anterior implant measures 1.5 cm on image 20/ series 1101 compare 1.4 cm on  the prior. Hepatobiliary: Extensive hepatic metastasis. Overall mild to (given short interval ) moderate progression. Example central right hepatic lobe lesion at 3.3 x 3.8 cm on image 47/ series 11001. Compare 2.8 x 3.5 cm on the prior. Lateral segment left liver lobe lesion measures 3.1 cm on image 49/ series 11001. Compare 3.0 cm on the prior. A pericholecystic right liver lobe lesion measures 4.7 x 4.0 cm on image 60/ series 11001. Compare 3.8 x 2.9 cm on the prior. Interval development of moderate intrahepatic biliary duct dilatation bilaterally. This continues to the level of the porta hepatis. Example image 89/series 4 and image 80/series 400. Amorphous soft tissue signal in this region is likely the cause, secondary to extension of porta hepatis infiltrative adenopathy. Example image 31/series 3 and image 49/ series 11002. A gallstone is again identified. There is moderate gallbladder wall thickening on image 36/series 3. The distal common duct is normal in caliber. Pancreas:  Normal, without mass or ductal dilatation.  Spleen:  Normal in size, without focal abnormality. Adrenals/Urinary Tract: Normal adrenal glands. Normal kidneys, without hydronephrosis. Stomach/Bowel: Normal stomach and abdominal bowel loops. Vascular/Lymphatic: Aortic atherosclerosis. Circumaortic left renal vein. Porta hepatis adenopathy is progressive as detailed above. Example component in the portal caval space at 1.6 cm on image 31/series 3. Compare 1.4 cm on the prior. Progressive retroperitoneal adenopathy. A new node in the aortocaval space measures 1.3 cm on image 39/series 3. Other:  Trace perihepatic ascites. Musculoskeletal: Re- demonstration of osseous metastasis. Example lesion with the T11 vertebral body which measures 1.5 cm on image 22/series 12. Compare 1.4 cm on the prior. The inferior L3 lesion measures 2.8 x 1.6 cm on image 28/series 12 versus 2.9 x 1.8 cm on the prior. IMPRESSION: 1. Progression of hepatic metastasis. 2. Progressive adenopathy within the porta hepatis with increased retroperitoneal nodal metastasis. 3. Development of moderate intrahepatic biliary duct dilatation, secondary to soft tissue fullness in the porta hepatis, favored to related to infiltrative progressive adenopathy. Normal caliber common duct distally. 4. Cholelithiasis. Gallbladder wall thickening is nonspecific. Please see prior ultrasound. 5. Similar osseous and left pleural metastasis. Findings were reported to Dr. Loletha Carrow at 1:35 p.m. Electronically Signed   By: Abigail Miyamoto M.D.   On: 08/29/2016 13:36     Scheduled Meds: . acetaminophen  500 mg Oral TID WC & HS  . famotidine  40 mg Oral Daily  . heparin  5,000 Units Subcutaneous Q8H  . naproxen  250 mg Oral TID WC & HS   Continuous Infusions: . sodium chloride 75 mL/hr at 08/28/16 1430  . piperacillin-tazobactam (ZOSYN)  IV 3.375 g (08/29/16 1630)    Time spent: 35 minutes bedside in 3 separate visits, reviewed images bedside with patient, discussing with GI and Oncology    Marzetta Board,  MD, PhD [facility] Pager [phone]  If 7PM-7AM, please contact night-coverage [email or web address] Password [email or web address] 08/29/2016, 5:55 PM

## 2016-08-29 NOTE — Progress Notes (Signed)
Progress Note   Subjective  Chief Complaint: Jaundice  This morning the patient is found sitting up in bed. She tells [place] she got a dose of Zofran this morning which has made her "loopy". She does ask questions in regards to what recommendations if any and how soon she will receive them after her upcoming MRI scheduled this morning. She does tell us that she spoke with her oncologist who believes that steroids may help to reverse some of the effects of Keytruda if this is truly what is thought to be causing her jaundice. Patient reports that aspirin and Tylenol are controlling her right upper quadrant abdominal pain. She does continue with some nausea which is helped with Zofran.   Review of systems: She denies chest pain or dyspnea. She no longer has right upper quadrant pain with inspiration and very little pain with movement. All of this is controlled on oral NSAIDs and Tylenol.  Objective   Vital signs in last 24 hours: Temp:  [97.6 F (36.4 C)-97.7 F (36.5 C)] 97.7 F (36.5 C) (06/01 0600) Pulse Rate:  [88-92] 88 (06/01 0600) Resp:  [16-20] 20 (06/01 0600) BP: (131-144)/(83-86) 131/83 (06/01 0600) SpO2:  [99 %-100 %] 99 % (06/01 0600) Last BM Date: 08/28/16 General:    Caucasian female in NAD, sclerae icteric, jaundiced Heart:  Regular rate and rhythm; no murmurs Lungs: Respirations even and unlabored, lungs CTA bilaterally Abdomen:  Soft, mild right upper quadrant tenderness and nondistended. Normal bowel sounds. Extremities:  Without edema. Neurologic:  Alert and oriented,  grossly normal neurologically. Psych:  Cooperative. Normal mood and affect.  Intake/Output from previous day: 05/31 0701 - 06/01 0700 In: 120 [P.O.:120] Out: 4000 [Urine:4000] Intake/Output this shift: Total I/O In: -  Out: 400 [Urine:400]  Lab Results:  Recent Labs  08/27/16 0404  WBC 4.8  HGB 11.1*  HCT 30.4*  PLT 466*   BMET  Recent Labs  08/27/16 0404 08/28/16 0721  08/29/16 0508  NA 135 134* 135  K 3.7 3.9 3.7  CL 103 103 104  CO2 23 22 22   GLUCOSE 87 103* 97  BUN 7 6 <5*  CREATININE 0.31* 0.30* 0.33*  CALCIUM 7.6* 7.8* 7.7*   LFT  Recent Labs  08/29/16 0508  PROT 5.8*  ALBUMIN 2.7*  AST 324*  ALT 321*  ALKPHOS 519*  BILITOT 14.5*   PT/INR  Recent Labs  08/28/16 0721 08/29/16 0508  LABPROT 13.9 14.4  INR 1.07 1.11    Assessment / Plan:   Assessment: 1. Jaundice: Again this is thought most likely due to drug-induced liver injury from her recent chemotherapeutic regimen on top of the fact that the liver has a large tumor burden 2. Right upper quadrant pain: This is likely from liver metastasis and less likely cholecystitis, patient is being treated empirically for possible cholecystitis, we are awaiting results of MRI today for further information 3. Metastatic breast cancer  Plan: 1. Dr. Loletha Carrow did have detailed discussion with the patient this morning regarding the fact that we will need to await MRI results before we can provide further recommendations. It was discussed with the patient that Dr. Loletha Carrow will be unavailable from around 12 until 2/3 o'clock this afternoon as he will be doing procedures. When he is done with those procedures, and barring any emergencies, he will take the time to review the imaging studies with the radiologist to make plans for the patient later this afternoon, and at latest tomorrow morning. If this does  not show a blockage of her liver ducts, per her oncologist could consider steroids. Appreciate their recommendations regarding this in future. 2. Continue supportive measures 3. I did see this patient with Dr. Loletha Carrow today, please await any further recommendations below  Thank you for your kind consultation, we will continue to follow   LOS: 3 days   Levin Erp  08/29/2016, 10:23 AM  Pager # [phone]  I have discussed the case with the PA, and that is the plan I formulated. I personally  interviewed and examined the patient. The patient was seen with our PA, and she acted as my Education administrator.  This remains a complex scenario, and it is unclear how much of this jaundice is from drug-induced liver injury and how much may be obstructive from known metastatic disease at the porta hepatis. It seems that her malignancy has been progressing over the last several months, so there is great concern for the latter. Hopefully we will get a good quality MRI to help settle this issue. If she appears to have a high-grade obstruction from tumor at the porta hepatis causing obstruction of the common hepatic duct, she would require transfer to a negative at [facility] with advanced biliary endoscopy for consideration of complex metal stent placement.  Further recommendations will follow pending the results of her MRI today.  Her husband was present for the entire encounter, and all their questions were answered.  Total 25 minutes time including all patient encounter, discussions and record review.  Nelida Meuse III Pager [phone]  Mon-Fri 8a-5p [phone] after 5p, weekends, holidays\

## 2016-08-29 NOTE — Progress Notes (Signed)
Discussed results of MRI with patient. From a cancer point of view, there has been measurable if small growth. Accordingly we need to "turn off" the pembrolizumab to prevent further hepatic inflammation from that source and to decrease inflammation in general. With a little luck that may help open up some of the biliary obstruction.  I discussed side effects and toxicities of steroids with Lis and also wrote for lorazepam to help her sleep.  She would prefer to stay here tonight but hopes to go home tomorrow. She understands Dr Loletha Carrow plans to see her in AM after a procedure and she hopes to have an opinion from IR Pascal Lux) not necessarily in person.  I will drop by tomorrow midmorning.  Greatly appreciate your help to this complex patient!

## 2016-08-30 ENCOUNTER — Other Ambulatory Visit: Payer: Self-pay | Admitting: Oncology

## 2016-08-30 DIAGNOSIS — L298 Other pruritus: Secondary | ICD-10-CM

## 2016-08-30 DIAGNOSIS — K819 Cholecystitis, unspecified: Secondary | ICD-10-CM

## 2016-08-30 DIAGNOSIS — C50211 Malignant neoplasm of upper-inner quadrant of right female breast: Secondary | ICD-10-CM

## 2016-08-30 DIAGNOSIS — Z170 Estrogen receptor positive status [ER+]: Secondary | ICD-10-CM

## 2016-08-30 LAB — CBC
HCT: 32.8 % — ABNORMAL LOW (ref 36.0–46.0)
HEMOGLOBIN: 11.9 g/dL — AB (ref 12.0–15.0)
MCH: 35.7 pg — ABNORMAL HIGH (ref 26.0–34.0)
MCHC: 36.3 g/dL — ABNORMAL HIGH (ref 30.0–36.0)
MCV: 98.5 fL (ref 78.0–100.0)
Platelets: 514 10*3/uL — ABNORMAL HIGH (ref 150–400)
RBC: 3.33 MIL/uL — AB (ref 3.87–5.11)
RDW: 14.5 % (ref 11.5–15.5)
WBC: 7.2 10*3/uL (ref 4.0–10.5)

## 2016-08-30 LAB — COMPREHENSIVE METABOLIC PANEL
ALBUMIN: 2.8 g/dL — AB (ref 3.5–5.0)
ALK PHOS: 581 U/L — AB (ref 38–126)
ALT: 338 U/L — AB (ref 14–54)
AST: 326 U/L — ABNORMAL HIGH (ref 15–41)
Anion gap: 7 (ref 5–15)
BUN: 5 mg/dL — ABNORMAL LOW (ref 6–20)
CALCIUM: 8.1 mg/dL — AB (ref 8.9–10.3)
CO2: 24 mmol/L (ref 22–32)
CREATININE: 0.33 mg/dL — AB (ref 0.44–1.00)
Chloride: 102 mmol/L (ref 101–111)
GFR calc Af Amer: >60 mL/min (ref >60)
GFR calc non Af Amer: >60 mL/min (ref >60)
GLUCOSE: 155 mg/dL — AB (ref 65–99)
Potassium: 3.8 mmol/L (ref 3.5–5.1)
SODIUM: 133 mmol/L — AB (ref 135–145)
Total Bilirubin: 15.2 mg/dL — ABNORMAL HIGH (ref 0.3–1.2)
Total Protein: 6.3 g/dL — ABNORMAL LOW (ref 6.5–8.1)

## 2016-08-30 MED ORDER — FAMOTIDINE 40 MG PO TABS: 20.0000 mg | ORAL_TABLET | Freq: Two times a day (BID) | ORAL | 1 refills | Status: DC

## 2016-08-30 MED ORDER — KETOROLAC TROMETHAMINE 10 MG PO TABS: 10.0000 mg | ORAL_TABLET | Freq: Four times a day (QID) | ORAL | 0 refills | Status: DC | PRN

## 2016-08-30 MED ORDER — DEXAMETHASONE 4 MG PO TABS: 4.0000 mg | ORAL_TABLET | Freq: Two times a day (BID) | ORAL | 0 refills | Status: DC

## 2016-08-30 MED ORDER — ACETAMINOPHEN 500 MG PO TABS: 500.0000 mg | ORAL_TABLET | Freq: Three times a day (TID) | ORAL | 0 refills | Status: DC

## 2016-08-30 MED ORDER — CIPROFLOXACIN HCL 500 MG PO TABS: 500.0000 mg | ORAL_TABLET | Freq: Two times a day (BID) | ORAL | 0 refills | Status: DC

## 2016-08-30 MED ORDER — NAPROXEN 250 MG PO TABS: 250.0000 mg | ORAL_TABLET | Freq: Three times a day (TID) | ORAL | 0 refills | Status: DC

## 2016-08-30 MED ORDER — LORAZEPAM 0.5 MG PO TABS: 0.5000 mg | ORAL_TABLET | Freq: Three times a day (TID) | ORAL | 0 refills | Status: AC | PRN

## 2016-08-30 NOTE — Progress Notes (Signed)
Patient discharged to home with family, discharge instructions reviewed with patient and family who verbalized understanding. New RX's given to patient.

## 2016-08-30 NOTE — Progress Notes (Signed)
[place] Progress Note  Chief Complaint: Jaundice  Subjective  History:  [name] [name] has minimal right upper quadrant pain with inspiration or activity. It is well controlled with just NSAIDs and Tylenol. Please see my progress note from late yesterday, in which I reported the results of her MRI and my impressions and recommendations. I reviewed the note from [place] of last evening. She will be started on steroids to hopefully reduce any drug-induced liver injury caused by the [facility].  ROS: Cardiovascular:  no chest pain Respiratory: no dyspnea  Objective:  Med list reviewed  Vital signs in last 24 hrs: Vitals:   08/29/16 0600 08/29/16 1330  BP: 131/83 (!) 142/90  Pulse: 88 80  Resp: 20 16  Temp: 97.7 F (36.5 C) 97.8 F (36.6 C)    Physical Exam   HEENT: sclera icteric, oral mucosa moist without lesions  Neck: supple, no thyromegaly, JVD or lymphadenopathy  Cardiac: RRR without murmurs, S1S2 heard, no peripheral edema  Pulm: clear to auscultation bilaterally, normal RR and effort noted  Abdomen: soft, no tenderness, with active bowel sounds. No guarding or palpable hepatosplenomegaly  Skin; warm and dry, no jaundice or rash, + deeply jaundiced  Recent Labs:   Recent Labs Lab 08/26/16 0300 08/26/16 0359 08/27/16 0404  WBC 7.4  --  4.8  HGB 11.6* 12.2 11.1*  HCT 33.0* 36.0 30.4*  PLT 542*  --  466*    Recent Labs Lab 08/29/16 0508  NA 135  K 3.7  CL 104  CO2 22  BUN <5*  ALBUMIN 2.7*  ALKPHOS 519*  ALT 321*  AST 324*  GLUCOSE 97    Recent Labs Lab 08/29/16 0508  INR 1.11    Radiologic studies: MRI/MRCP results personally reviewed and discussed with radiologist as noted   @ASSESSMENTPLANBEGIN @ Assessment:  Jaundice. This appears largely obstructive from the large amount of progressive nodal metastasis at the porta hepatis There may also be an element of drug-induced liver injury, but that now seems to be a much lesser  component  Metastatic breast cancer  Right upper quadrant pain that is largely resolved at this point. I do not think she is likely to have had cholecystitis. However, as noted before, because this was a possibility and she is immune compromise, I think we should complete a total seven-day course of antibiotics. She has already had 5 days of Zosyn. Another 2 days of ciprofloxacin and amoxicillin would be appropriate. I would prefer she did not get Augmentin because that is one of the more potentially hepatotoxic antibiotics.  I discussed the MRI findings in detail with her. I also summarize my discussions with doctors [name] [name] and [name] [name]. Dr. Pascal [name] feels that it might be possible to do a left-sided access peripheral biliary drain. He and I agree that the amount this may decrease her bilirubin and also the longevity of such a catheter with progressive metastatic disease is uncertain. She does not want to do that at this point. She would, however like to speak with Dr. Pascal [name] about the possibility of some localized radiation therapy. That seems unlikely to be technically feasible or successful to me given the amount of disease we are seeing in the porta hepatis, but I will certainly leave that further discussion and decision to Dr. Pascal [name]. I will get a message to him today since he is on call and ask him to at least speak with [name] [name] by phone if he cannot come by and person. She is insistent upon going home today.  Total 25 minutes time, over half spent in discussion regarding her complex scenario and the current treatment options. I'm afraid I have nothing else to offer her at this point, I will be glad to answer any questions as they arise.  [name] [name] III Pager [phone] Mon-Fri 8a-5p [phone] after 5p, weekends, holidays

## 2016-08-30 NOTE — Discharge Summary (Addendum)
Physician Discharge Summary  [name] [name] QTM:[id number] DOB: 07/11/1960 DOA: 08/26/2016  PCP: Chauncey Cruel, MD  Admit date: 08/26/2016 Discharge date: 08/30/2016  Admitted From: home Disposition:  Home  Recommendations for Outpatient Follow-up:  1. Follow up with Dr. Jana Hakim in 4 days as scheduled 2. Continue Ciprofloxacin for 2 additional days 3. Continue Decadron for 4 additional days  Home Health: none  Equipment/Devices: none   Discharge Condition: stable. High readmission risk CODE STATUS: Full code Diet recommendation: regular  HPI: Per Dr. Candiss Norse, [name] [name]  is a 56 y.o. 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 Jana Hakim, GERD, anxiety who comes in with one-day history of sharp right upper quadrant abdominal pain which is nonradiating, associated with mild nausea, no aggravating or relieving factors along with elevated LFTs. She was recently given Keytruda chemotherapy and elevated LFTs could be a potential side effect however she also has right upper quadrant abdominal pain, she came to the ER where workup showed sonographic evidence of Murphy's sign on ultrasound, elevated LFTs but no fever or leukocytosis. General surgery was called along with oncology and I was requested to admit. Patient besides abdominal pain and icterus has no complaints. She does understand that her cancer now is being resistant to medications but would like to discuss her options with Dr. Jana Hakim, she is currently not wanting any antibiotics until she talks to Dr. Jana Hakim as well.  Hospital Course: Discharge Diagnoses:  Principal Problem:   RUQ pain Active Problems:   Malignant neoplasm of upper-inner quadrant of right breast in female, estrogen receptor positive (HCC)   Abnormal LFTs (liver function tests)   Bone metastases (HCC)   Malignant pleural  effusion, left   Liver metastases (HCC)   Cholelithiasis   Jaundice   Elevated liver function tests -patient was admitted to the hospital with elevated LFTs and jaundice as well as RUQ pain.  Initial ultrasound of the right upper quadrant showed cholelithiasis and concerns for acute cholecystitis.  She was started on IV antibiotics. General surgery, gastroenterology as well as oncology were consulted. She then underwent a HIDA scan which showed lack of visualization of both gallbladder and tracer going into the small bowel.  After discussion with gastroenterology, surgery, myself and oncology, it is unlikely that she had gallbladder pathology, and then patient underwent an MRCP.  This showed progression of hepatic metastasis with progressive adenopathy within the porta hepatis with obstruction of the intrahepatic biliary ducts.  Her elevation in LFTs and bilirubin is likely due to this obstruction, however a component of liver toxicity from [facility] cannot be fully excluded therefore she was started on steroids. Per gastroenterology, this lesion is not amenable to an ERCP or an internal stent, and its best addressed by percutaneous biliary drain.  Interventional radiology was consulted, however patient wants to postpone external drain placement until she finishes the course of steroids and sees to what degree that will help.  She was asking to go home, she has been stable while hospitalized, and while bilirubin is still slowly increasing she has no signs of liver failure.  Discussed with GI, oncology, okay to discharge home and she will have close outpatient follow-up with Dr. Jana Hakim in 4 days.  He will check labs at that time.  She will be given 2 additional days of ciprofloxacin to finish a week's course, as well as Decadron for steroids for  the next 4 day.  Her right upper quadrant pain was controlled with NSAIDs, will continue on discharge.  Metastatic stage IV breast cancer with metastases to skull,  left pleura and liver -with liver mets progression and biliary obstruction as discussed above Anxiety -ativan PRN Anemia -likely in the setting of malignancy   Discharge Instructions   Allergies as of 08/30/2016      Reactions   2nd Skin Quick Heal Other (See Comments)   Other Reaction: Skin peels   Decadron [dexamethasone] Other (See Comments)   Patient does not tolerate steroids.    Dilaudid [hydromorphone] Nausea And Vomiting   Enoxaparin Other (See Comments)   unknown   Fluconazole Swelling   Liver toxicity   Hydromorphone Hcl Nausea And Vomiting   Morphine And Related Nausea And Vomiting   Ondansetron Other (See Comments)   "makes me loopy"   Protonix [pantoprazole Sodium] Other (See Comments)   Patient reports it caused thrush.   Tegaderm Ag Mesh [silver]       Medication List    STOP taking these medications   ALPRAZolam 0.5 MG tablet Commonly known as:  XANAX   FIRST-DUKES MOUTHWASH Susp   gi cocktail Susp suspension   letrozole 2.5 MG tablet Commonly known as:  FEMARA   palbociclib 75 MG capsule Commonly known as:  IBRANCE     TAKE these medications   acetaminophen 500 MG tablet Commonly known as:  TYLENOL Take 1 tablet (500 mg total) by mouth 4 (four) times daily -  with meals and at bedtime.   B-complex with vitamin C tablet Take 1 tablet by mouth daily. Reported on 03/27/2015   calcium carbonate 500 MG chewable tablet Commonly known as:  TUMS - dosed in mg elemental calcium Chew 1 tablet by mouth as directed.   ciprofloxacin 500 MG tablet Commonly known as:  CIPRO Take 1 tablet (500 mg total) by mouth 2 (two) times daily.   dexamethasone 4 MG tablet Commonly known as:  DECADRON Take 1 tablet (4 mg total) by mouth 2 (two) times daily.   famotidine 40 MG tablet Commonly known as:  PEPCID Take 0.5-1 tablets (20-40 mg total) by mouth 2 (two) times daily. 40 mg in the morning and 20 mg in the evening What changed:  how much to take  when to  take this  additional instructions   FLORASTOR 250 MG capsule Generic drug:  saccharomyces boulardii Take 250 mg by mouth daily.   folic acid 1 MG tablet Commonly known as:  FOLVITE Take 1 tablet (1 mg total) by mouth daily.   ketorolac 10 MG tablet Commonly known as:  TORADOL Take 1 tablet (10 mg total) by mouth every 6 (six) hours as needed.   loratadine-pseudoephedrine 10-240 MG 24 hr tablet Commonly known as:  CLARITIN-D 24-hour Take 1 tablet by mouth daily.   LORazepam 0.5 MG tablet Commonly known as:  ATIVAN Take 1 tablet (0.5 mg total) by mouth every 8 (eight) hours as needed for anxiety.   Melatonin 3 MG Tabs Take 3 mg by mouth at bedtime.   naproxen 250 MG tablet Commonly known as:  NAPROSYN Take 1 tablet (250 mg total) by mouth 4 (four) times daily -  with meals and at bedtime.   phenazopyridine 100 MG tablet Commonly known as:  PYRIDIUM Take 1 tablet (100 mg total) by mouth 3 (three) times daily as needed for pain.   RABEprazole Sodium 5 MG Cpsp Take 5 mg by mouth daily.   Vitamin  D 2000 units tablet Take 1 tablet (2,000 Units total) by mouth daily.   vitamin E 400 UNIT capsule Commonly known as:  vitamin E Take 1 capsule (400 Units total) by mouth 2 (two) times daily.       Allergies  Allergen Reactions  . 2nd Skin Quick Heal Other (See Comments)    Other Reaction: Skin peels  . Decadron [Dexamethasone] Other (See Comments)    Patient does not tolerate steroids.   . Dilaudid [Hydromorphone] Nausea And Vomiting  . Enoxaparin Other (See Comments)    unknown  . Fluconazole Swelling    Liver toxicity  . Hydromorphone Hcl Nausea And Vomiting  . Morphine And Related Nausea And Vomiting  . Ondansetron Other (See Comments)    "makes me loopy"  . Protonix [Pantoprazole Sodium] Other (See Comments)    Patient reports it caused thrush.  . Tegaderm Ag Mesh [Silver]     Consultations:  Gastroenterology  General  surgery  Oncology  Procedures/Studies:  Mr Jeri Cos Wo Contrast  Result Date: 08/07/2016 CLINICAL DATA:  Breast cancer with metastatic disease. EXAM: MRI HEAD WITHOUT AND WITH CONTRAST TECHNIQUE: Multiplanar, multiecho pulse sequences of the brain and surrounding structures were obtained without and with intravenous contrast. CONTRAST:  60mL MULTIHANCE GADOBENATE DIMEGLUMINE 529 MG/ML IV SOLN COMPARISON:  CT head 03/13/2015 FINDINGS: Brain: Ventricle size normal. Negative for acute or chronic infarction. No intracranial hemorrhage or mass. No evidence of metastatic disease to the brain. Leptomeningeal enhancement is normal. Vascular: Normal arterial flow void. Skull and upper cervical spine: 2 cm enhancing mass lesion in the left parietal bone compatible with metastatic disease unchanged. 13 mm enhancing lesion in the right parietal bone anteriorly also unchanged and compatible with metastatic disease. Sinuses/Orbits: Negative Other: None IMPRESSION: Skull lesions in the parietal bone bilaterally are stable. No evidence of metastatic disease to the brain. No acute intracranial abnormality. Electronically Signed   By: Franchot Gallo M.D.   On: 08/07/2016 15:52   Nm Hepatobiliary Liver Func  Result Date: 08/26/2016 CLINICAL DATA:  Right upper quadrant pain EXAM: NUCLEAR MEDICINE HEPATOBILIARY IMAGING VIEWS: Anterior right upper quadrant RADIOPHARMACEUTICALS:  7.9 mCi Tc-81m  Choletec IV COMPARISON:  Ultrasound right upper quadrant Aug 15, 2016 FINDINGS: Images were obtained serially over a 3 hour time span. Morphine was not administered due to history of allergic reaction to morphine. Liver uptake of radiotracer is inhomogeneous consistent with known liver metastases. Over a 3 hour time span, there is no appreciable visualization of either gallbladder or small bowel. IMPRESSION: Nonvisualization of gallbladder over a 3 hour time span is suggestive of cystic duct obstruction/ acute cholecystitis. Note,  however, that there is also nonvisualization of small bowel after 3 hours. This finding potentially could be indicative of common bile duct obstruction. However, this finding also could be indicative of hepatic stasis phenomenon. Serum bilirubin is moderately elevated at this time. Known liver metastases demonstrated by inhomogeneous uptake of radiotracer. Given lack of visualization of both gallbladder and small bowel, it may be prudent to consider MRCP to assess for possible common bile duct lesion as the cause for lack of small bowel visualization on this study. Electronically Signed   By: Lowella Grip III M.D.   On: 08/26/2016 16:13   US Abdomen Complete  Result Date: 08/26/2016 CLINICAL DATA:  RIGHT upper quadrant pain beginning yesterday. Jaundice. History of metastatic breast cancer with known liver metastasis. EXAM: ABDOMEN ULTRASOUND COMPLETE COMPARISON:  Abdominal ultrasound Aug 15, 2016 and MRI of the abdomen  July 24, 2016 FINDINGS: Gallbladder: 1.9 cm echogenic gallstone with acoustic shadowing surrounded by mildly echogenic sludge with increased through transmission. Mild gallbladder wall thickening without pericholecystic fluid. Sonographic Murphy's sign elicited. Common bile duct: Diameter: 5 mm Liver: Multiple heterogeneous liver mass is consistent with patient's known metastasis. Porta hepatis lymphadenopathy present though, better characterized on prior MRI. IVC: No abnormality visualized. Pancreas: Visualized portion unremarkable. Spleen: Size and appearance within normal limits. Right Kidney: Length: 11.5 cm. Echogenicity within normal limits. No mass or hydronephrosis visualized. Left Kidney: Length: 11.8 cm. Echogenicity within normal limits. No mass or hydronephrosis visualized. Abdominal aorta: No aneurysm visualized. Other findings: None. IMPRESSION: Cholelithiasis and sonographic findings of acute cholecystitis. Numerous hepatic and nodal metastasis. Electronically Signed   By:  Elon Alas M.D.   On: 08/26/2016 06:36   Mr 3d Recon At Scanner  Result Date: 08/29/2016 CLINICAL DATA:  Metastatic breast cancer with liver metastasis. Evaluate for metastasis versus biliary obstruction. New chemotherapy. Jaundice. EXAM: MRI ABDOMEN WITHOUT AND WITH CONTRAST (INCLUDING MRCP) TECHNIQUE: Multiplanar multisequence MR imaging of the abdomen was performed both before and after the administration of intravenous contrast. Heavily T2-weighted images of the biliary and pancreatic ducts were obtained, and three-dimensional MRCP images were rendered by post processing. CONTRAST:  24mL MULTIHANCE GADOBENATE DIMEGLUMINE 529 MG/ML IV SOLN COMPARISON:  Ultrasound 08/26/2016.  Most recent MRI of 07/24/2016. FINDINGS: Portions of exam are minimally motion degraded. Lower chest: Mild cardiomegaly. Left-sided pleural thickening remains. Index enhancing left pleural implant measures 1.4 cm on image 19/ series 11001. Compare 1.5 cm on the prior exam. More anterior implant measures 1.5 cm on image 20/ series 1101 compare 1.4 cm on the prior. Hepatobiliary: Extensive hepatic metastasis. Overall mild to (given short interval ) moderate progression. Example central right hepatic lobe lesion at 3.3 x 3.8 cm on image 47/ series 11001. Compare 2.8 x 3.5 cm on the prior. Lateral segment left liver lobe lesion measures 3.1 cm on image 49/ series 11001. Compare 3.0 cm on the prior. A pericholecystic right liver lobe lesion measures 4.7 x 4.0 cm on image 60/ series 11001. Compare 3.8 x 2.9 cm on the prior. Interval development of moderate intrahepatic biliary duct dilatation bilaterally. This continues to the level of the porta hepatis. Example image 89/series 4 and image 80/series 400. Amorphous soft tissue signal in this region is likely the cause, secondary to extension of porta hepatis infiltrative adenopathy. Example image 31/series 3 and image 49/ series 11002. A gallstone is again identified. There is moderate  gallbladder wall thickening on image 36/series 3. The distal common duct is normal in caliber. Pancreas:  Normal, without mass or ductal dilatation. Spleen:  Normal in size, without focal abnormality. Adrenals/Urinary Tract: Normal adrenal glands. Normal kidneys, without hydronephrosis. Stomach/Bowel: Normal stomach and abdominal bowel loops. Vascular/Lymphatic: Aortic atherosclerosis. Circumaortic left renal vein. Porta hepatis adenopathy is progressive as detailed above. Example component in the portal caval space at 1.6 cm on image 31/series 3. Compare 1.4 cm on the prior. Progressive retroperitoneal adenopathy. A new node in the aortocaval space measures 1.3 cm on image 39/series 3. Other:  Trace perihepatic ascites. Musculoskeletal: Re- demonstration of osseous metastasis. Example lesion with the T11 vertebral body which measures 1.5 cm on image 22/series 12. Compare 1.4 cm on the prior. The inferior L3 lesion measures 2.8 x 1.6 cm on image 28/series 12 versus 2.9 x 1.8 cm on the prior. IMPRESSION: 1. Progression of hepatic metastasis. 2. Progressive adenopathy within the porta hepatis with increased retroperitoneal  nodal metastasis. 3. Development of moderate intrahepatic biliary duct dilatation, secondary to soft tissue fullness in the porta hepatis, favored to related to infiltrative progressive adenopathy. Normal caliber common duct distally. 4. Cholelithiasis. Gallbladder wall thickening is nonspecific. Please see prior ultrasound. 5. Similar osseous and left pleural metastasis. Findings were reported to Dr. Loletha Carrow at 1:35 p.m. Electronically Signed   By: Abigail Miyamoto M.D.   On: 08/29/2016 13:36   Mr Abdomen Mrcp Moise Boring Contast  Result Date: 08/29/2016 CLINICAL DATA:  Metastatic breast cancer with liver metastasis. Evaluate for metastasis versus biliary obstruction. New chemotherapy. Jaundice. EXAM: MRI ABDOMEN WITHOUT AND WITH CONTRAST (INCLUDING MRCP) TECHNIQUE: Multiplanar multisequence MR imaging of  the abdomen was performed both before and after the administration of intravenous contrast. Heavily T2-weighted images of the biliary and pancreatic ducts were obtained, and three-dimensional MRCP images were rendered by post processing. CONTRAST:  5mL MULTIHANCE GADOBENATE DIMEGLUMINE 529 MG/ML IV SOLN COMPARISON:  Ultrasound 08/26/2016.  Most recent MRI of 07/24/2016. FINDINGS: Portions of exam are minimally motion degraded. Lower chest: Mild cardiomegaly. Left-sided pleural thickening remains. Index enhancing left pleural implant measures 1.4 cm on image 19/ series 11001. Compare 1.5 cm on the prior exam. More anterior implant measures 1.5 cm on image 20/ series 1101 compare 1.4 cm on the prior. Hepatobiliary: Extensive hepatic metastasis. Overall mild to (given short interval ) moderate progression. Example central right hepatic lobe lesion at 3.3 x 3.8 cm on image 47/ series 11001. Compare 2.8 x 3.5 cm on the prior. Lateral segment left liver lobe lesion measures 3.1 cm on image 49/ series 11001. Compare 3.0 cm on the prior. A pericholecystic right liver lobe lesion measures 4.7 x 4.0 cm on image 60/ series 11001. Compare 3.8 x 2.9 cm on the prior. Interval development of moderate intrahepatic biliary duct dilatation bilaterally. This continues to the level of the porta hepatis. Example image 89/series 4 and image 80/series 400. Amorphous soft tissue signal in this region is likely the cause, secondary to extension of porta hepatis infiltrative adenopathy. Example image 31/series 3 and image 49/ series 11002. A gallstone is again identified. There is moderate gallbladder wall thickening on image 36/series 3. The distal common duct is normal in caliber. Pancreas:  Normal, without mass or ductal dilatation. Spleen:  Normal in size, without focal abnormality. Adrenals/Urinary Tract: Normal adrenal glands. Normal kidneys, without hydronephrosis. Stomach/Bowel: Normal stomach and abdominal bowel loops.  Vascular/Lymphatic: Aortic atherosclerosis. Circumaortic left renal vein. Porta hepatis adenopathy is progressive as detailed above. Example component in the portal caval space at 1.6 cm on image 31/series 3. Compare 1.4 cm on the prior. Progressive retroperitoneal adenopathy. A new node in the aortocaval space measures 1.3 cm on image 39/series 3. Other:  Trace perihepatic ascites. Musculoskeletal: Re- demonstration of osseous metastasis. Example lesion with the T11 vertebral body which measures 1.5 cm on image 22/series 12. Compare 1.4 cm on the prior. The inferior L3 lesion measures 2.8 x 1.6 cm on image 28/series 12 versus 2.9 x 1.8 cm on the prior. IMPRESSION: 1. Progression of hepatic metastasis. 2. Progressive adenopathy within the porta hepatis with increased retroperitoneal nodal metastasis. 3. Development of moderate intrahepatic biliary duct dilatation, secondary to soft tissue fullness in the porta hepatis, favored to related to infiltrative progressive adenopathy. Normal caliber common duct distally. 4. Cholelithiasis. Gallbladder wall thickening is nonspecific. Please see prior ultrasound. 5. Similar osseous and left pleural metastasis. Findings were reported to Dr. Loletha Carrow at 1:35 p.m. Electronically Signed   By: Marylyn Ishihara  Jobe Igo M.D.   On: 08/29/2016 13:36   US Abdomen Limited Ruq  Result Date: 08/15/2016 CLINICAL DATA:  Acute right upper quadrant pain, metastatic breast cancer to the liver EXAM: US ABDOMEN LIMITED - RIGHT UPPER QUADRANT COMPARISON:  07/24/2016 FINDINGS: Gallbladder: Echogenic shadowing gallstone noted measuring 1.7 cm. Normal wall thickness measuring 2.4 mm. No Murphy's sign. No pericholecystic fluid. Negative for cholecystitis by ultrasound. Common bile duct: Diameter: 4.6 mm Liver: Numerous solid hepatic metastases again evident, largest lesions are in the right lobe measuring up to 7.8 cm in diameter. No biliary dilatation or obstruction.  No ascites. IMPRESSION: Cholelithiasis.  Negative for acute cholecystitis or biliary obstruction Extensive hepatic metastases Electronically Signed   By: Jerilynn Mages.  Shick M.D.   On: 08/15/2016 11:22     Subjective: - no chest pain, shortness of breath, no abdominal pain, nausea or vomiting.   Discharge Exam: Vitals:   08/29/16 0600 08/29/16 1330  BP: 131/83 (!) 142/90  Pulse: 88 80  Resp: 20 16  Temp: 97.7 F (36.5 C) 97.8 F (36.6 C)   Vitals:   08/28/16 1438 08/28/16 2045 08/29/16 0600 08/29/16 1330  BP: (!) 144/86 (!) 142/83 131/83 (!) 142/90  Pulse: 88 92 88 80  Resp: 18 16 20 16   Temp: 97.6 F (36.4 C) 97.7 F (36.5 C) 97.7 F (36.5 C) 97.8 F (36.6 C)  TempSrc: Oral Oral Oral Oral  SpO2: 100% 99% 99% 99%  Weight:        General: Pt is alert, awake, not in acute distress, icteric Cardiovascular: RRR, S1/S2 +, no rubs, no gallops Respiratory: CTA bilaterally, no wheezing, no rhonchi    The results of significant diagnostics from this hospitalization (including imaging, microbiology, ancillary and laboratory) are listed below for reference.     Microbiology: No results found for this or any previous visit (from the past 240 hour(s)).   Labs: BNP (last 3 results) No results for input(s): BNP in the last 8760 hours. Basic Metabolic Panel:  Recent Labs Lab 08/26/16 0300 08/26/16 0359 08/27/16 0404 08/28/16 0721 08/29/16 0508 08/30/16 0752  NA 129* 131* 135 134* 135 133*  K 3.8 3.9 3.7 3.9 3.7 3.8  CL 98* 98* 103 103 104 102  CO2 22  --  23 22 22 24   GLUCOSE 123* 121* 87 103* 97 155*  BUN 6 4* 7 6 <5* 5*  CREATININE 0.34* 0.50 0.31* 0.30* 0.33* 0.33*  CALCIUM 8.0*  --  7.6* 7.8* 7.7* 8.1*   Liver Function Tests:  Recent Labs Lab 08/26/16 0300 08/27/16 0404 08/28/16 0721 08/29/16 0508 08/30/16 0752  AST 306* 313* 342* 324* 326*  ALT 370* 341* 346* 321* 338*  ALKPHOS 542* 522* 565* 519* 581*  BILITOT 12.5* 13.0* 14.1* 14.5* 15.2*  PROT 6.8 6.1* 6.1* 5.8* 6.3*  ALBUMIN 3.2* 2.8* 2.9* 2.7*  2.8*    Recent Labs Lab 08/26/16 0300  LIPASE 29   CBC:  Recent Labs Lab 08/26/16 0300 08/26/16 0359 08/27/16 0404 08/30/16 0752  WBC 7.4  --  4.8 7.2  NEUTROABS 6.0  --   --   --   HGB 11.6* 12.2 11.1* 11.9*  HCT 33.0* 36.0 30.4* 32.8*  MCV 102.5*  --  100.3* 98.5  PLT 542*  --  466* 514*   Urinalysis    Component Value Date/Time   COLORURINE AMBER (A) 08/26/2016 0334   APPEARANCEUR CLEAR 08/26/2016 0334   LABSPEC 1.015 08/26/2016 0334   LABSPEC 1.005 05/16/2016 1201   PHURINE 5.0 08/26/2016  [place] 08/26/2016 0334   GLUCOSEU Negative 05/16/2016 1201   HGBUR SMALL (A) 08/26/2016 0334   BILIRUBINUR MODERATE (A) 08/26/2016 0334   BILIRUBINUR Negative 05/16/2016 1201   KETONESUR 20 (A) 08/26/2016 0334   PROTEINUR NEGATIVE 08/26/2016 0334   UROBILINOGEN 0.2 05/16/2016 1201   NITRITE NEGATIVE 08/26/2016 0334   LEUKOCYTESUR NEGATIVE 08/26/2016 0334   LEUKOCYTESUR Small 05/16/2016 1201    Time coordinating discharge: 60 minutes  SIGNED:  Marzetta Board, MD  [facility] 08/30/2016, 2:27 PM Pager [phone]  If 7PM-7AM, please contact night-coverage [email or web address] Password [email or web address]

## 2016-08-30 NOTE — Progress Notes (Signed)
Long discussion this AM with [name] [name] and [name] [name]. They understand we have tried many different treatments in the past -- she is now 13 years out from her initial; breast cancer diagnosis and 7 years out from her diagnosis of metastatic disease --and the cancer has slowly but relentlessly continued to grow. It is now to the point that end of life concerns are in view.  There are always 3 questions to go over at this point and so we reviewed those today. The first question is do we treat or not. In some cases the patient's overall situation is so discouraging that palliative/comfort care alone is appropriate. If the decision is made to treat, then the next question is whether anti-estrogens or chemotherapy is more appropriate. Once that decision is made than the third question is: Which agent or combination of agents in particular should be used?  We discussed the difference between palliative and life-prolonging treatments. Dr Pascal Lux is proposing a biliary drainage procedure-- that is intended to relieve symptoms (including the possibility of severe pruritus from hyperbilirubinemia). It is not expected to prolong life. I strongly encouraged her to consider that. She wants to wait a few days to see if the steroids reduce inflammation sufficiently to allow some drainage. She will have lab work 6/5 and see me 6/6. I anticipate little response and expect she will be ready to proceed with that intervention anytime after that.  By contrast the Y90 treatment would aim at life prolongation. Given [name] [name]'s sensitivity to treatments, there may be significant complications, potentially fatal ones. She will have to decide whether to take that chance or simply accept the fact that this cancer is going to take her life in the next few months and concentrate on comfort care with [facility]osp[facility]'s support.  Before making a decision on the Y90 she would like to check re possible studies at a tertiary care center. Her experience at [facility] was not  favorable. I will arrange for visits at [facility] likely 2d or 3d week this month.  [name] [name] already has a living will in place and does not want to be resuscitated in case of a terminal event.  In short, the plan is:  Steroids next 4 days, labs 6/5 and visit 6/6 Biliary drainage catheter placement by Dr Pascal Lux anytime after that visit "Second" opinion re research options at [facility] this month Consideration of Y90 treatment vs switching to palliative/comfort care with Hospice support  I am grateful to Drs Cruzita Lederer, Pascal Lux and Danis for their outstanding care and compassionate support to this complex patient

## 2016-08-30 NOTE — Progress Notes (Signed)
Patient ID: [name] [name], female   DOB: 1960/07/24, 56 y.o.   MRN: [id number]      Chief Complaint: Biliary obstruction secondary to progression of hepatic metastatic disease  Referring Physician(s): Danis (GI). Magrinat (Oncology) Cruzita Lederer (IM)  Patient Status: [facility] - In-pt  History of Present Illness: [name] [name] is a 56 y.o. female with past medical history significant for metastatic breast cancer who is well-known to myself and interventional radiology as I placed a Pleurx catheter for her on 09/11/2013 (which was subsequently removed in August 2016) who is again seen in consultation for attention potential Y90 hepatic radioembolization on 05/21/2016.    Abdominal MRI performed 05/08/2016 demonstrated progression of hepatic metastatic disease, however at that time, the patient wished to avoid proceeding with Y90 as previous systemic therapies had resulted in transient elevation of her liver function tests.  As such, patient was started on pembrolizumab cycles however was admitted to the hospital earlier this week secondary to jaundice and recurrent elevation of LFTs.  Unfortunately, contrast enhanced abdominal MRI performed 08/29/2016 demonstrates marked progression of now extensive hepatic metastatic disease with malignant obstruction at the level of the biliary hilum with development of his intrahepatic ductal dilatation within both the left and right lobes of the liver.  Images were reviewed and discussed with Dr. Loletha Carrow however he does not feel the hilar level of biliary obstruction would be amenable to endoscopic intervention.   As such, IR was consulted to discuss the appropriateness transhepatic biliary drainage catheter placement.  The patient is accompanied by her husband though serves as her own historian. The patient states that her jaundice and scleral icterus are unchanged to minimally improved and she is hopeful that the systemic steroids Dr. Jana Hakim has begun will  improve her LFTs if any of the biliary obstruction is attributable to inflammation.  Patient continues to complain of right upper quadrant abdominal pain. She is otherwise without complaint.  Specifically, no fever or chills. She is not complaining of itchiness associated with her jaundice.  The patient is hopeful she can be discharged later today.    Past Medical History:  Diagnosis Date  . Bone metastases ([place]) dx'd 05/2014  . Breast cancer ([place]) dx'd 2005/2011  . Peripheral vascular disease ([place]) 02/2010   blood clot related to porta cath  . PONV (postoperative nausea and vomiting)   . S/P radiation therapy 07/17/2014 through 08/02/2014    Left mediastinum, left seventh rib 3250 cGy in 13 sessions   . S/P radiation therapy 12/11/2014 through 12/22/2014    Left parietal calvarium 2400 cGy in 8 sessions   . Seizures ([place]) 2010   Isolated incident.    Past Surgical History:  Procedure Laterality Date  . AXILLARY LYMPH NODE DISSECTION  Dec. 2011  . BREAST LUMPECTOMY  2005  . IR GENERIC HISTORICAL  05/21/2016   IR RADIOLOGIST EVAL & MGMT 05/21/2016 Sandi Mariscal, MD [facility] INTERV RAD  . MEDIASTINOTOMY CHAMBERLAIN MCNEIL Left 06/02/2013   Procedure: MEDIASTINOTOMY CHAMBERLAIN MCNEIL;  Surgeon: [name], MD;  Location: [place];  Service: Thoracic;  Laterality: Left;  LEFT ANTERIOR MEDIASTINOTOMY   . PORTACATH PLACEMENT  12/11  . removal portacath      Allergies: 2nd skin quick heal; Decadron [dexamethasone]; Dilaudid [hydromorphone]; Enoxaparin; Fluconazole; Hydromorphone hcl; Morphine and related; Ondansetron; Protonix [pantoprazole sodium]; and Tegaderm ag mesh [silver]  Medications: Prior to Admission medications   Medication Sig Start Date End Date Taking? Authorizing Provider  B Complex-C (B-COMPLEX WITH VITAMIN C) tablet  Take 1 tablet by mouth daily. Reported on 03/27/2015 01/19/15  Yes Magrinat, Virgie Dad, MD  calcium carbonate (TUMS - DOSED IN MG ELEMENTAL CALCIUM) 500 MG chewable tablet Chew 1 tablet by mouth as directed.   Yes [provider]  cholecalciferol 2000 UNITS tablet Take 1 tablet (2,000 Units total) by mouth daily. 01/19/15  Yes Magrinat, Virgie Dad, MD  famotidine (PEPCID) 40 MG tablet Take 1 tablet (40 mg total) by mouth daily. 08/15/16  Yes Little, Wenda Overland, MD  folic acid (FOLVITE) 1 MG tablet Take 1 tablet (1 mg total) by mouth daily. 01/19/15  Yes Magrinat, Virgie Dad, MD  letrozole [facility]) 2.5 MG tablet Take 2.5 mg by mouth at bedtime. 07/22/16  Yes [provider]  loratadine-pseudoephedrine (CLARITIN-D 24-HOUR) 10-240 MG 24 hr tablet Take 1 tablet by mouth daily.   Yes [provider]  Melatonin 3 MG TABS Take 3 mg by mouth at bedtime.   Yes [provider]  RABEprazole Sodium 5 MG CPSP Take 5 mg by mouth daily. 10/08/15  Yes Magrinat, Virgie Dad, MD  saccharomyces boulardii (FLORASTOR) 250 MG capsule Take 250 mg by mouth daily.  03/23/15  Yes Magrinat, Virgie Dad, MD  ALPRAZolam Duanne Moron) 0.5 MG tablet Take 1 tablet (0.5 mg total) by mouth 2 (two) times daily as needed for anxiety. Patient not taking: Reported on 08/26/2016 03/03/16   Magrinat, Virgie Dad, MD  Alum & Mag Hydroxide-Simeth (GI COCKTAIL) SUSP suspension Take 30 mLs by mouth 3 (three) times daily as needed for indigestion. Shake well. Patient not taking: Reported on 08/26/2016 08/15/16   Little, Wenda Overland, MD  Diphenhyd-Hydrocort-Nystatin (FIRST-DUKES MOUTHWASH) SUSP 5-10 ml qid SWISH AND SPIT Patient not taking: Reported on 08/15/2016 05/09/15   Magrinat, Virgie Dad, MD  palbociclib [facility]) 75 MG capsule Take 1 capsule (75 mg total) by mouth every other day. Take whole with food. Patient not taking: Reported on 08/26/2016 07/25/16   Magrinat, Virgie Dad, MD  phenazopyridine (PYRIDIUM) 100 MG tablet Take 1  tablet (100 mg total) by mouth 3 (three) times daily as needed for pain. Patient not taking: Reported on 08/15/2016 05/16/16   Magrinat, Virgie Dad, MD  vitamin E (VITAMIN E) 400 UNIT capsule Take 1 capsule (400 Units total) by mouth 2 (two) times daily. Patient not taking: Reported on 08/15/2016 05/16/16   Kyung Rudd, MD     Family History  Problem Relation Age of Onset  . COPD Mother   . Breast cancer Sister 1    Social History   Social History  . Marital status: Married    Spouse name: N/A  . Number of children: N/A  . Years of education: N/A   Social History Main Topics  . Smoking status: Never Smoker  . Smokeless tobacco: Never Used  . Alcohol use No  . Drug use: No  . Sexual activity: Yes   Other Topics Concern  . None   Social History Narrative  . None    ECOG Status: 2 - Symptomatic, <50% confined to bed  Review of Systems: A 12 point ROS discussed and pertinent positives are indicated in the HPI above.  All other systems are negative.  Review of Systems  Constitutional: Positive for activity change and fatigue. Negative for fever.  Respiratory: Negative.   Cardiovascular: Negative.   Gastrointestinal: Positive for abdominal distention.  Skin: Positive for color change.    Vital Signs: BP (!) 142/90 (BP Location: Left Arm) Comment: rn was told  Pulse 80  Temp 97.8 F (36.6 C) (Oral)   Resp 16   Wt 174 lb (78.9 kg)   SpO2 99%   BMI 29.87 kg/m   Physical Exam  Constitutional: She appears well-developed and well-nourished.  Eyes: Scleral icterus is present.  Skin:  Mild diffuse jaundice.  Psychiatric: She has a normal mood and affect. Her behavior is normal.  Vitals reviewed.   Imaging: Mr Jeri Cos HB Contrast  Result Date: 08/07/2016 CLINICAL DATA:  Breast cancer with metastatic disease. EXAM: MRI HEAD WITHOUT AND WITH CONTRAST TECHNIQUE: Multiplanar, multiecho pulse sequences of the brain and surrounding structures were obtained without and  with intravenous contrast. CONTRAST:  72mL MULTIHANCE GADOBENATE DIMEGLUMINE 529 MG/ML IV SOLN COMPARISON:  CT head 03/13/2015 FINDINGS: Brain: Ventricle size normal. Negative for acute or chronic infarction. No intracranial hemorrhage or mass. No evidence of metastatic disease to the brain. Leptomeningeal enhancement is normal. Vascular: Normal arterial flow void. Skull and upper cervical spine: 2 cm enhancing mass lesion in the left parietal bone compatible with metastatic disease unchanged. 13 mm enhancing lesion in the right parietal bone anteriorly also unchanged and compatible with metastatic disease. Sinuses/Orbits: Negative Other: None IMPRESSION: Skull lesions in the parietal bone bilaterally are stable. No evidence of metastatic disease to the brain. No acute intracranial abnormality. Electronically Signed   By: Franchot Gallo M.D.   On: 08/07/2016 15:52   Nm Hepatobiliary Liver Func  Result Date: 08/26/2016 CLINICAL DATA:  Right upper quadrant pain EXAM: NUCLEAR MEDICINE HEPATOBILIARY IMAGING VIEWS: Anterior right upper quadrant RADIOPHARMACEUTICALS:  7.9 mCi Tc-17m  Choletec IV COMPARISON:  Ultrasound right upper quadrant Aug 15, 2016 FINDINGS: Images were obtained serially over a 3 hour time span. Morphine was not administered due to history of allergic reaction to morphine. Liver uptake of radiotracer is inhomogeneous consistent with known liver metastases. Over a 3 hour time span, there is no appreciable visualization of either gallbladder or small bowel. IMPRESSION: Nonvisualization of gallbladder over a 3 hour time span is suggestive of cystic duct obstruction/ acute cholecystitis. Note, however, that there is also nonvisualization of small bowel after 3 hours. This finding potentially could be indicative of common bile duct obstruction. However, this finding also could be indicative of hepatic stasis phenomenon. Serum bilirubin is moderately elevated at this time. Known liver metastases  demonstrated by inhomogeneous uptake of radiotracer. Given lack of visualization of both gallbladder and small bowel, it may be prudent to consider MRCP to assess for possible common bile duct lesion as the cause for lack of small bowel visualization on this study. Electronically Signed   By: Lowella Grip III M.D.   On: 08/26/2016 16:13   US Abdomen Complete  Result Date: 08/26/2016 CLINICAL DATA:  RIGHT upper quadrant pain beginning yesterday. Jaundice. History of metastatic breast cancer with known liver metastasis. EXAM: ABDOMEN ULTRASOUND COMPLETE COMPARISON:  Abdominal ultrasound Aug 15, 2016 and MRI of the abdomen July 24, 2016 FINDINGS: Gallbladder: 1.9 cm echogenic gallstone with acoustic shadowing surrounded by mildly echogenic sludge with increased through transmission. Mild gallbladder wall thickening without pericholecystic fluid. Sonographic Murphy's sign elicited. Common bile duct: Diameter: 5 mm Liver: Multiple heterogeneous liver mass is consistent with patient's known metastasis. Porta hepatis lymphadenopathy present though, better characterized on prior MRI. IVC: No abnormality visualized. Pancreas: Visualized portion unremarkable. Spleen: Size and appearance within normal limits. Right Kidney: Length: 11.5 cm. Echogenicity within normal limits. No mass or hydronephrosis visualized. Left Kidney: Length: 11.8 cm. Echogenicity within normal limits. No mass or hydronephrosis visualized.  Abdominal aorta: No aneurysm visualized. Other findings: None. IMPRESSION: Cholelithiasis and sonographic findings of acute cholecystitis. Numerous hepatic and nodal metastasis. Electronically Signed   By: Elon Alas M.D.   On: 08/26/2016 06:36   Mr 3d Recon At Scanner  Result Date: 08/29/2016 CLINICAL DATA:  Metastatic breast cancer with liver metastasis. Evaluate for metastasis versus biliary obstruction. New chemotherapy. Jaundice. EXAM: MRI ABDOMEN WITHOUT AND WITH CONTRAST (INCLUDING MRCP)  TECHNIQUE: Multiplanar multisequence MR imaging of the abdomen was performed both before and after the administration of intravenous contrast. Heavily T2-weighted images of the biliary and pancreatic ducts were obtained, and three-dimensional MRCP images were rendered by post processing. CONTRAST:  64mL MULTIHANCE GADOBENATE DIMEGLUMINE 529 MG/ML IV SOLN COMPARISON:  Ultrasound 08/26/2016.  Most recent MRI of 07/24/2016. FINDINGS: Portions of exam are minimally motion degraded. Lower chest: Mild cardiomegaly. Left-sided pleural thickening remains. Index enhancing left pleural implant measures 1.4 cm on image 19/ series 11001. Compare 1.5 cm on the prior exam. More anterior implant measures 1.5 cm on image 20/ series 1101 compare 1.4 cm on the prior. Hepatobiliary: Extensive hepatic metastasis. Overall mild to (given short interval ) moderate progression. Example central right hepatic lobe lesion at 3.3 x 3.8 cm on image 47/ series 11001. Compare 2.8 x 3.5 cm on the prior. Lateral segment left liver lobe lesion measures 3.1 cm on image 49/ series 11001. Compare 3.0 cm on the prior. A pericholecystic right liver lobe lesion measures 4.7 x 4.0 cm on image 60/ series 11001. Compare 3.8 x 2.9 cm on the prior. Interval development of moderate intrahepatic biliary duct dilatation bilaterally. This continues to the level of the porta hepatis. Example image 89/series 4 and image 80/series 400. Amorphous soft tissue signal in this region is likely the cause, secondary to extension of porta hepatis infiltrative adenopathy. Example image 31/series 3 and image 49/ series 11002. A gallstone is again identified. There is moderate gallbladder wall thickening on image 36/series 3. The distal common duct is normal in caliber. Pancreas:  Normal, without mass or ductal dilatation. Spleen:  Normal in size, without focal abnormality. Adrenals/Urinary Tract: Normal adrenal glands. Normal kidneys, without hydronephrosis. Stomach/Bowel:  Normal stomach and abdominal bowel loops. Vascular/Lymphatic: Aortic atherosclerosis. Circumaortic left renal vein. Porta hepatis adenopathy is progressive as detailed above. Example component in the portal caval space at 1.6 cm on image 31/series 3. Compare 1.4 cm on the prior. Progressive retroperitoneal adenopathy. A new node in the aortocaval space measures 1.3 cm on image 39/series 3. Other:  Trace perihepatic ascites. Musculoskeletal: Re- demonstration of osseous metastasis. Example lesion with the T11 vertebral body which measures 1.5 cm on image 22/series 12. Compare 1.4 cm on the prior. The inferior L3 lesion measures 2.8 x 1.6 cm on image 28/series 12 versus 2.9 x 1.8 cm on the prior. IMPRESSION: 1. Progression of hepatic metastasis. 2. Progressive adenopathy within the porta hepatis with increased retroperitoneal nodal metastasis. 3. Development of moderate intrahepatic biliary duct dilatation, secondary to soft tissue fullness in the porta hepatis, favored to related to infiltrative progressive adenopathy. Normal caliber common duct distally. 4. Cholelithiasis. Gallbladder wall thickening is nonspecific. Please see prior ultrasound. 5. Similar osseous and left pleural metastasis. Findings were reported to Dr. Loletha Carrow at 1:35 p.m. Electronically Signed   By: Abigail Miyamoto M.D.   On: 08/29/2016 13:36   Mr Abdomen Mrcp Moise Boring Contast  Result Date: 08/29/2016 CLINICAL DATA:  Metastatic breast cancer with liver metastasis. Evaluate for metastasis versus biliary obstruction. New chemotherapy. Jaundice.  EXAM: MRI ABDOMEN WITHOUT AND WITH CONTRAST (INCLUDING MRCP) TECHNIQUE: Multiplanar multisequence MR imaging of the abdomen was performed both before and after the administration of intravenous contrast. Heavily T2-weighted images of the biliary and pancreatic ducts were obtained, and three-dimensional MRCP images were rendered by post processing. CONTRAST:  53mL MULTIHANCE GADOBENATE DIMEGLUMINE 529 MG/ML IV SOLN  COMPARISON:  Ultrasound 08/26/2016.  Most recent MRI of 07/24/2016. FINDINGS: Portions of exam are minimally motion degraded. Lower chest: Mild cardiomegaly. Left-sided pleural thickening remains. Index enhancing left pleural implant measures 1.4 cm on image 19/ series 11001. Compare 1.5 cm on the prior exam. More anterior implant measures 1.5 cm on image 20/ series 1101 compare 1.4 cm on the prior. Hepatobiliary: Extensive hepatic metastasis. Overall mild to (given short interval ) moderate progression. Example central right hepatic lobe lesion at 3.3 x 3.8 cm on image 47/ series 11001. Compare 2.8 x 3.5 cm on the prior. Lateral segment left liver lobe lesion measures 3.1 cm on image 49/ series 11001. Compare 3.0 cm on the prior. A pericholecystic right liver lobe lesion measures 4.7 x 4.0 cm on image 60/ series 11001. Compare 3.8 x 2.9 cm on the prior. Interval development of moderate intrahepatic biliary duct dilatation bilaterally. This continues to the level of the porta hepatis. Example image 89/series 4 and image 80/series 400. Amorphous soft tissue signal in this region is likely the cause, secondary to extension of porta hepatis infiltrative adenopathy. Example image 31/series 3 and image 49/ series 11002. A gallstone is again identified. There is moderate gallbladder wall thickening on image 36/series 3. The distal common duct is normal in caliber. Pancreas:  Normal, without mass or ductal dilatation. Spleen:  Normal in size, without focal abnormality. Adrenals/Urinary Tract: Normal adrenal glands. Normal kidneys, without hydronephrosis. Stomach/Bowel: Normal stomach and abdominal bowel loops. Vascular/Lymphatic: Aortic atherosclerosis. Circumaortic left renal vein. Porta hepatis adenopathy is progressive as detailed above. Example component in the portal caval space at 1.6 cm on image 31/series 3. Compare 1.4 cm on the prior. Progressive retroperitoneal adenopathy. A new node in the aortocaval space  measures 1.3 cm on image 39/series 3. Other:  Trace perihepatic ascites. Musculoskeletal: Re- demonstration of osseous metastasis. Example lesion with the T11 vertebral body which measures 1.5 cm on image 22/series 12. Compare 1.4 cm on the prior. The inferior L3 lesion measures 2.8 x 1.6 cm on image 28/series 12 versus 2.9 x 1.8 cm on the prior. IMPRESSION: 1. Progression of hepatic metastasis. 2. Progressive adenopathy within the porta hepatis with increased retroperitoneal nodal metastasis. 3. Development of moderate intrahepatic biliary duct dilatation, secondary to soft tissue fullness in the porta hepatis, favored to related to infiltrative progressive adenopathy. Normal caliber common duct distally. 4. Cholelithiasis. Gallbladder wall thickening is nonspecific. Please see prior ultrasound. 5. Similar osseous and left pleural metastasis. Findings were reported to Dr. Loletha Carrow at 1:35 p.m. Electronically Signed   By: Abigail Miyamoto M.D.   On: 08/29/2016 13:36   US Abdomen Limited Ruq  Result Date: 08/15/2016 CLINICAL DATA:  Acute right upper quadrant pain, metastatic breast cancer to the liver EXAM: US ABDOMEN LIMITED - RIGHT UPPER QUADRANT COMPARISON:  07/24/2016 FINDINGS: Gallbladder: Echogenic shadowing gallstone noted measuring 1.7 cm. Normal wall thickness measuring 2.4 mm. No Murphy's sign. No pericholecystic fluid. Negative for cholecystitis by ultrasound. Common bile duct: Diameter: 4.6 mm Liver: Numerous solid hepatic metastases again evident, largest lesions are in the right lobe measuring up to 7.8 cm in diameter. No biliary dilatation or obstruction.  No ascites. IMPRESSION: Cholelithiasis. Negative for acute cholecystitis or biliary obstruction Extensive hepatic metastases Electronically Signed   By: Jerilynn Mages.  Shick M.D.   On: 08/15/2016 11:22    Labs:  CBC:  Recent Labs  08/19/16 1047 08/26/16 0300 08/26/16 0359 08/27/16 0404 08/30/16 0752  WBC 3.9 7.4  --  4.8 7.2  HGB 12.2 11.6* 12.2  11.1* 11.9*  HCT 34.6* 33.0* 36.0 30.4* 32.8*  PLT 270 542*  --  466* 514*    COAGS:  Recent Labs  08/27/16 0404 08/28/16 0721 08/29/16 0508  INR 1.07 1.07 1.11    BMP:  Recent Labs  08/27/16 0404 08/28/16 0721 08/29/16 0508 08/30/16 0752  NA 135 134* 135 133*  K 3.7 3.9 3.7 3.8  CL 103 103 104 102  CO2 23 22 22 24   GLUCOSE 87 103* 97 155*  BUN 7 6 <5* 5*  CALCIUM 7.6* 7.8* 7.7* 8.1*  CREATININE 0.31* 0.30* 0.33* 0.33*  GFRNONAA >60 >60 >60 >60  GFRAA >60 >60 >60 >60    LIVER FUNCTION TESTS:  Recent Labs  08/27/16 0404 08/28/16 0721 08/29/16 0508 08/30/16 0752  BILITOT 13.0* 14.1* 14.5* 15.2*  AST 313* 342* 324* 326*  ALT 341* 346* 321* 338*  ALKPHOS 522* 565* 519* 581*  PROT 6.1* 6.1* 5.8* 6.3*  ALBUMIN 2.8* 2.9* 2.7* 2.8*    TUMOR MARKERS: No results for input(s): AFPTM, CEA, CA199, CHROMGRNA in the last 8760 hours.  Assessment and Plan:  SUTTON PLAKE is a 56 y.o. female with past medical history significant for metastatic breast cancer Currently admitted with progression of hepatic metastatic disease and development of intrahepatic biliary obstruction secondary to progression of hilar metastasis.  Review of abdominal MRI demonstrates nearly entirety the right lobe of the liver is replaced with metastatic disease, precluding attempted placement of biliary drainage catheter placement from the regular tree, however there is a dilated duct within the left biliary tree which could be amenable to percutaneous drain placement.  I explained that given her current elevated bilirubin level (unfortunately, today's bilirubin continues to trend upwards, currently at 15.2), she would not be a candidate for Y90 (ideal bilirubin level for Y90 is <2) and is likely not a candidate for any systemic treatment.  Given the extent of her disease, no additional percutaneous treatment options (specifcally hepatic ablation) are indicated.  As such, I explained the  biliary drainage catheter could be placed in hopes of optimizing her liver function as well as to decrease her chance of developing itchiness associated with her jaundice.  I explained that biliary drainage catheter placement is typically performed in the inpatient setting as patients can become transiently bacteremia initially after biliary drainage catheter placement.  I explained the procedure may entail several procedures with initial biliary drainage catheter placement followed by subsequent attempts at internalization.   I explained the biliary drainage catheter would most likely be connected to a gravity bag with a small chance of allowing for biliary drainage catheter to be capped if she is adequately draining internally.  I explained that she is likely not a candidate for percutaneous biliary stent placement given the hilar location level of her disease.  I also explained there is a very high likelihood that if we place the biliary drainage catheter, there is a good chance it would be permanent and require exchanges every 2-3 months.  Finally, I explained that the biliary drainage catheter placement may not ultimately improve her bilirubin to a level where she is a candidate  for additional interventions.  Following the prolonged and detailed discussion, the following plan was agreed upon: - The patient will continue on her prescribed dose of steroids and continue to monitor her biliary level to look for improvement. If her bilirubin level is improved following a course of steroids, she could be considered for Y 90 at that time.  Again, bilirubin should be <2 to be a candidate for Y90.  If bilirubin level is NOT improved with steroids, the following options will be considered:  - If she remains asymptomatic in regards to her biliary obstruction, potential conservative management could be pursued.  - If she wishes to optimize her liver function, we could proceed with biliary drainage catheter  placement, again, requiring (at least) an overnight admission for continued observation and management given risk of transient sepsis.  I will be in conversations with Dr. Jana Hakim over the coming days as we formulate a definitive plan of care.  A copy of this report was sent to the requesting provider on this date.  Electronically Signed: Sandi Mariscal, MD 08/30/2016, 9:42 AM   I spent a total of 40 Minutes in face to face in clinical consultation, greater than 50% of which was counseling/coordinating care for metastatic breast cancer.

## 2016-08-30 NOTE — Care Management Note (Signed)
Case Management Note  Patient Details  Name: [name] [name] MRN: [id number] Date of Birth: [birth year]  Subjective/Objective:    Metastatic breast cancer                Action/Plan: Discharge Planning: Chart reviewed. No NCM needs identified.   PCP Lurline Del MD  Expected Discharge Date:  08/30/16               Expected Discharge Plan:  Home/Self Care  In-House Referral:  NA  Discharge planning Services  CM Consult  Post Acute Care Choice:  NA Choice offered to:  NA  DME Arranged:  N/A DME Agency:  NA  HH Arranged:  NA HH Agency:  NA  Status of Service:  Completed, signed off  If discussed at [place] of Stay Meetings, dates discussed:    Additional Comments:  Erenest Rasher, RN 08/30/2016, 12:13 PM

## 2016-09-01 ENCOUNTER — Ambulatory Visit: Payer: 59 | Attending: Oncology | Admitting: Physical Therapy

## 2016-09-01 ENCOUNTER — Other Ambulatory Visit: Payer: 59

## 2016-09-01 DIAGNOSIS — C50011 Malignant neoplasm of nipple and areola, right female breast: Secondary | ICD-10-CM | POA: Diagnosis not present

## 2016-09-01 DIAGNOSIS — R1031 Right lower quadrant pain: Secondary | ICD-10-CM | POA: Diagnosis present

## 2016-09-01 DIAGNOSIS — C771 Secondary and unspecified malignant neoplasm of intrathoracic lymph nodes: Secondary | ICD-10-CM | POA: Diagnosis not present

## 2016-09-01 DIAGNOSIS — I890 Lymphedema, not elsewhere classified: Secondary | ICD-10-CM | POA: Insufficient documentation

## 2016-09-01 DIAGNOSIS — M6289 Other specified disorders of muscle: Secondary | ICD-10-CM | POA: Diagnosis present

## 2016-09-01 DIAGNOSIS — M62838 Other muscle spasm: Secondary | ICD-10-CM | POA: Diagnosis present

## 2016-09-01 DIAGNOSIS — C7951 Secondary malignant neoplasm of bone: Secondary | ICD-10-CM | POA: Diagnosis not present

## 2016-09-01 NOTE — Therapy (Signed)
[place], [place], [address] Phone: [phone]   Fax:  [phone]  Physical Therapy Treatment  Patient Details  Name: [name] [name] MRN: [id number] Date of Birth: Jan 22, 1961 Referring Provider: Dr. Kyung Rudd  Encounter Date: 09/01/2016      PT End of Session - 09/01/16 1724    Visit Number 58  42 for lymph   Number of Visits 25  for lymph   Date for PT Re-Evaluation 09/15/16   Authorization - Visit Number 4   Authorization - Number of Visits 140   PT Start Time 1301   PT Stop Time 8280   PT Time Calculation (min) 46 min   Activity Tolerance Patient tolerated treatment well   Behavior During Therapy [facility] for tasks assessed/performed      Past Medical History:  Diagnosis Date  . Bone metastases ([place]) dx'd 05/2014  . Breast cancer ([place]) dx'd 2005/2011  . Peripheral vascular disease ([place]) 02/2010   blood clot related to porta cath  . PONV (postoperative nausea and vomiting)   . S/P radiation therapy 07/17/2014 through 08/02/2014    Left mediastinum, left seventh rib 3250 cGy in 13 sessions   . S/P radiation therapy 12/11/2014 through 12/22/2014    Left parietal calvarium 2400 cGy in 8 sessions   . Seizures ([place]) 2010   Isolated incident.    Past Surgical History:  Procedure Laterality Date  . AXILLARY LYMPH NODE DISSECTION  Dec. 2011  . BREAST LUMPECTOMY  2005  . IR GENERIC HISTORICAL  05/21/2016   IR RADIOLOGIST EVAL & MGMT 05/21/2016 Sandi Mariscal, MD [facility] INTERV RAD  . MEDIASTINOTOMY CHAMBERLAIN MCNEIL Left 06/02/2013   Procedure: MEDIASTINOTOMY CHAMBERLAIN MCNEIL;  Surgeon: [name], MD;  Location: [place];  Service: Thoracic;  Laterality: Left;  LEFT ANTERIOR MEDIASTINOTOMY   . PORTACATH PLACEMENT  12/11  . removal  portacath      There were no vitals filed for this visit.      Subjective Assessment - 09/01/16 1719    Subjective Went home from the hospital on Saturday.  Will be having referral to doctors at [facility].  Legs have swollen and right side is uncomfortable.   Currently in Pain? Yes   Pain Score --  not rated   Pain Location Axilla   Pain Orientation Right   Pain Descriptors / Indicators Other (Comment)  fullness   Pain Type Chronic pain   Pain Onset More than a month ago   Aggravating Factors  more swelling   Pain Relieving Factors therapy                         [facility] Adult PT Treatment/Exercise - 09/01/16 0001      Manual Therapy   Myofascial Release right axilla with focus on scar tightness   Manual Lymphatic Drainage (MLD) In left sidelying, posterior interaxillary anastomosis and right axillo-inguinal anastomosis; in supine, short neck, left axilla and anterior interaxillary anastomosis, right groin and axillo-inguinal anastomosis, and right upper extremity from fingers to shoulder.  In supine with legs elevated, each leg from inguinal area down leg and retracing steps to drain back to inguinal area on right and to axilla on left.   Other Manual Therapy In sitting, soft tissue work to neck and upper back, working on the right side more inferior to include area inferior to scapula, for muscle relaxation.  PT Short Term Goals - 02/12/16 1226      PT SHORT TERM GOAL #1   Title pain with walking decreased >/= 25%   Time 4   Period Weeks   Status Achieved           PT Long Term Goals - 08/05/16 1318      PT LONG TERM GOAL #1   Title indpendent with HEP   Time 8   Period Weeks   Status On-going     PT LONG TERM GOAL #2   Title pain with walking decreased >/= 75%   Time 8   Period Weeks   Status On-going     PT LONG TERM GOAL #3   Title ability to flex her right hip with right groin pain decreased >/= 50% due to improved  tissue mobility   Time 8   Period Weeks   Status Achieved     PT LONG TERM GOAL #4   Title waking up in middle of night with pain decreased >/=50%   Time 8   Period Weeks   Status Achieved     PT LONG TERM GOAL #5   Title reduction of pain by end of day >/= 50% due to increase tissue mobility   Time 8   Period Weeks   Status On-going           Long Term Clinic Goals - 08/22/16 1210      CC Long Term Goal  #2   Title Pt. will report swelling is adequately managed to enable ADL function at a consistent level.   Status On-going     CC Long Term Goal  #4   Title Pain/discomfort at right axilla area will be controlled at 6/10 or less.   Status On-going     CC Long Term Goal  #5   Title Patient will avoid infection by ongoing management of her lymphedema at right breast/axilla/upper arm areas.   Status On-going     CC Long Term Goal  #6   Title Pain in right groin/ischial tuberosity area will be controlled at 3/10 level or less to enable walking with less pain.   Status On-going            Plan - 09/01/16 1724    Clinical Impression Statement Patient returns after several days in the hospital for liver failure and pain.  She looks a little better today.  She now has leg swelling, she reports from steroid she is taking to help her liver with inflammation. She again felt benefit of treatment done today.   Rehab Potential Good   Clinical Impairments Affecting Rehab Potential active cancer   PT Frequency 2x / week   PT Duration 12 weeks   PT Treatment/Interventions ADLs/Self Care Home Management;Therapeutic activities;Therapeutic exercise;Patient/family education;Manual techniques;Manual lymph drainage;Scar mobilization   PT Next Visit Plan manual lymph drainage, soft tissue, and myofascial release   Consulted and Agree with Plan of Care Patient      Patient will benefit from skilled therapeutic intervention in order to improve the following deficits and impairments:   Increased edema, Increased fascial restricitons, Pain  Visit Diagnosis: Lymphedema, not elsewhere classified  Other muscle spasm     Problem List Patient Active Problem List   Diagnosis Date Noted  . Cholelithiasis   . Jaundice   . RUQ pain 08/26/2016  . Liver metastases ([place]) 02/23/2016  . 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 Zoster 04/04/2015  . Nausea with vomiting  11/18/2014  . Constipation 11/18/2014  . Left-sided thoracic back pain   . Bone metastases ([place]) 11/16/2014  . Back pain 11/15/2014  . Uncontrolled pain 11/14/2014  . Post-lymphadenectomy lymphedema of arm 05/31/2014  . Chest wall pain 03/21/2014  . Abnormal LFTs (liver function tests) 09/12/2013  . Malignant neoplasm of upper-inner quadrant of right breast in female, estrogen receptor positive ([place]) 08/18/2013  . 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

## 2016-09-02 ENCOUNTER — Other Ambulatory Visit (HOSPITAL_BASED_OUTPATIENT_CLINIC_OR_DEPARTMENT_OTHER): Payer: 59

## 2016-09-02 ENCOUNTER — Other Ambulatory Visit: Payer: Self-pay | Admitting: *Deleted

## 2016-09-02 ENCOUNTER — Ambulatory Visit: Payer: 59 | Admitting: Oncology

## 2016-09-02 ENCOUNTER — Other Ambulatory Visit: Payer: Self-pay | Admitting: Oncology

## 2016-09-02 DIAGNOSIS — C50211 Malignant neoplasm of upper-inner quadrant of right female breast: Secondary | ICD-10-CM | POA: Diagnosis not present

## 2016-09-02 DIAGNOSIS — Z170 Estrogen receptor positive status [ER+]: Principal | ICD-10-CM

## 2016-09-02 DIAGNOSIS — C771 Secondary and unspecified malignant neoplasm of intrathoracic lymph nodes: Secondary | ICD-10-CM | POA: Diagnosis not present

## 2016-09-02 DIAGNOSIS — C7951 Secondary malignant neoplasm of bone: Secondary | ICD-10-CM

## 2016-09-02 DIAGNOSIS — C787 Secondary malignant neoplasm of liver and intrahepatic bile duct: Secondary | ICD-10-CM

## 2016-09-02 LAB — COMPREHENSIVE METABOLIC PANEL
ALT: 531 U/L (ref 0–55)
AST: 430 U/L — AB (ref 5–34)
Albumin: 2.8 g/dL — ABNORMAL LOW (ref 3.5–5.0)
Alkaline Phosphatase: 743 U/L — ABNORMAL HIGH (ref 40–150)
Anion Gap: 11 mEq/L (ref 3–11)
BUN: 14 mg/dL (ref 7.0–26.0)
CO2: 25 meq/L (ref 22–29)
CREATININE: 0.7 mg/dL (ref 0.6–1.1)
Calcium: 8.5 mg/dL (ref 8.4–10.4)
Chloride: 96 mEq/L — ABNORMAL LOW (ref 98–109)
EGFR: >90 mL/min/{1.73_m2} (ref >90)
GLUCOSE: 138 mg/dL (ref 70–140)
Potassium: 3.4 mEq/L — ABNORMAL LOW (ref 3.5–5.1)
Sodium: 132 mEq/L — ABNORMAL LOW (ref 136–145)
TOTAL PROTEIN: 5.9 g/dL — AB (ref 6.4–8.3)
Total Bilirubin: 15.38 mg/dL (ref 0.20–1.20)

## 2016-09-02 LAB — CBC WITH DIFFERENTIAL/PLATELET
BASO%: 0.1 % (ref 0.0–2.0)
BASOS ABS: 0 10*3/uL (ref 0.0–0.1)
EOS%: 0 % (ref 0.0–7.0)
Eosinophils Absolute: 0 10*3/uL (ref 0.0–0.5)
HCT: 33.1 % — ABNORMAL LOW (ref 34.8–46.6)
HGB: 11.7 g/dL (ref 11.6–15.9)
LYMPH%: 3.1 % — AB (ref 14.0–49.7)
MCH: 35.3 pg — AB (ref 25.1–34.0)
MCHC: 35.3 g/dL (ref 31.5–36.0)
MCV: 100 fL (ref 79.5–101.0)
MONO#: 1.5 10*3/uL — ABNORMAL HIGH (ref 0.1–0.9)
MONO%: 11.7 % (ref 0.0–14.0)
NEUT#: 10.6 10*3/uL — ABNORMAL HIGH (ref 1.5–6.5)
NEUT%: 85.1 % — AB (ref 38.4–76.8)
Platelets: 477 10*3/uL — ABNORMAL HIGH (ref 145–400)
RBC: 3.31 10*6/uL — AB (ref 3.70–5.45)
RDW: 15.5 % — ABNORMAL HIGH (ref 11.2–14.5)
WBC: 12.5 10*3/uL — ABNORMAL HIGH (ref 3.9–10.3)
lymph#: 0.4 10*3/uL — ABNORMAL LOW (ref 0.9–3.3)
nRBC: 0 % (ref 0–0)

## 2016-09-02 NOTE — Progress Notes (Deleted)
[name] [name] came back today with her husband [name] [name] to recheck her liver function tests. 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 We hoped some of this could be due to inflammation from the pembrolizumab and she was started on steroids at the time of discharge from a recent hospitalization. She is tolerating the steroids well. They may be helping her appetite a little. Nevertheless the bilirubin remains above 15.  Today we discussed the fact that I feel she needs urgent intervention in the liver problem. Surgery is out of the question and we cannot do the yttrium treatment unless the bilirubin is less than 2. She was evaluated by Dr. Pascal Lux who feels a drainage tube placement may be possible. [name] [name] was very emotional today thinking about having a "colostomy bag looked up to her" for the rest of her life and also very concerned about the possible infectious and other complications of this procedure.  Nevertheless I really do not see any other option for her. After much discussion and also after discussing this with Dr. Pascal Lux I plan to admit her on Friday, June 8 for the procedure to be performed the morning of June 9. I anticipate a hospital stay of several days for pain control and to make sure she does not develop cholangitis or sepsis from the procedure.  We have also contacted [facility] and asked Dr. Samuel Bouche to evaluate her for possible studies. She is also interested in the [place] trial at the [place]  [id number]  We are checking her lab work again on June 8 prior to admission but I do not expect the bilirubin to be any lower at that time.  She knows to call for fever or any worsening of her condition.

## 2016-09-03 ENCOUNTER — Telehealth: Payer: Self-pay

## 2016-09-03 NOTE — Telephone Encounter (Signed)
Pt called stating that she was to have a lab appt scheduled for this Friday and it has not been done yet.  Per pt request she needs appt between 10:30-12noon with [name] in lab.  Msg sent high priority to scheduling

## 2016-09-04 ENCOUNTER — Other Ambulatory Visit: Payer: Self-pay | Admitting: Oncology

## 2016-09-04 ENCOUNTER — Ambulatory Visit: Payer: 59 | Admitting: Physical Therapy

## 2016-09-04 DIAGNOSIS — I890 Lymphedema, not elsewhere classified: Secondary | ICD-10-CM | POA: Diagnosis not present

## 2016-09-04 DIAGNOSIS — M62838 Other muscle spasm: Secondary | ICD-10-CM

## 2016-09-04 LAB — CANCER ANTIGEN 27.29

## 2016-09-04 NOTE — Therapy (Signed)
[place], [place], [address] Phone: [phone]   Fax:  [phone]  Physical Therapy Treatment  Patient Details  Name: [name] [name] MRN: [id number] Date of Birth: 11-22-1960 Referring Provider: Dr. Kyung Rudd  Encounter Date: 09/04/2016      PT End of Session - 09/04/16 1340    Visit Number 47  43 for lymph   Number of Visits 59  for lymph   Date for PT Re-Evaluation 09/15/16   Authorization - Visit Number 56   Authorization - Number of Visits 140   PT Start Time 2947   PT Stop Time 1109   PT Time Calculation (min) 54 min   Activity Tolerance Patient tolerated treatment well   Behavior During Therapy [facility] for tasks assessed/performed      Past Medical History:  Diagnosis Date  . Bone metastases ([place]) dx'd 05/2014  . Breast cancer ([place]) dx'd 2005/2011  . Peripheral vascular disease ([place]) 02/2010   blood clot related to porta cath  . PONV (postoperative nausea and vomiting)   . S/P radiation therapy 07/17/2014 through 08/02/2014    Left mediastinum, left seventh rib 3250 cGy in 13 sessions   . S/P radiation therapy 12/11/2014 through 12/22/2014    Left parietal calvarium 2400 cGy in 8 sessions   . Seizures ([place]) 2010   Isolated incident.    Past Surgical History:  Procedure Laterality Date  . AXILLARY LYMPH NODE DISSECTION  Dec. 2011  . BREAST LUMPECTOMY  2005  . IR GENERIC HISTORICAL  05/21/2016   IR RADIOLOGIST EVAL & MGMT 05/21/2016 Sandi Mariscal, MD [facility] INTERV RAD  . MEDIASTINOTOMY CHAMBERLAIN MCNEIL Left 06/02/2013   Procedure: MEDIASTINOTOMY CHAMBERLAIN MCNEIL;  Surgeon: [name], MD;  Location: [place];  Service: Thoracic;  Laterality: Left;  LEFT ANTERIOR MEDIASTINOTOMY   . PORTACATH PLACEMENT  12/11  . removal  portacath      There were no vitals filed for this visit.      Subjective Assessment - 09/04/16 1018    Subjective "I'm a little less yellow."  Dr. Jana Hakim said we've got to go in and do that tube thing (for the liver). Is hoping not to need it, and will have blood test tomorrow; procedure may happen Saturday morning.   Currently in Pain? Yes   Pain Score 6    Pain Location Axilla   Pain Orientation Right   Pain Descriptors / Indicators --  fullness   Pain Type Chronic pain   Aggravating Factors  more swelling   Pain Relieving Factors therapy   Pain Score 0   Pain Location Groin   Pain Orientation Right                         [facility] Adult PT Treatment/Exercise - 09/04/16 0001      Manual Therapy   Myofascial Release right axilla with focus on scar tightness   Manual Lymphatic Drainage (MLD) In left sidelying, posterior interaxillary anastomosis and right axillo-inguinal anastomosis; in supine, short neck, left axilla and anterior interaxillary anastomosis, right groin and axillo-inguinal anastomosis, and right upper extremity from fingers to shoulder.  In supine with legs elevated, each leg from inguinal area down leg and retracing steps to drain back to inguinal area on right and to axilla on left.   Other Manual Therapy In sitting, soft tissue work to neck and upper back, working on the right side more inferior to include area  inferior to scapula, for muscle relaxation.                  PT Short Term Goals - 02/12/16 1226      PT SHORT TERM GOAL #1   Title pain with walking decreased >/= 25%   Time 4   Period Weeks   Status Achieved           PT Long Term Goals - 08/05/16 1318      PT LONG TERM GOAL #1   Title indpendent with HEP   Time 8   Period Weeks   Status On-going     PT LONG TERM GOAL #2   Title pain with walking decreased >/= 75%   Time 8   Period Weeks   Status On-going     PT LONG TERM GOAL #3   Title ability to flex  her right hip with right groin pain decreased >/= 50% due to improved tissue mobility   Time 8   Period Weeks   Status Achieved     PT LONG TERM GOAL #4   Title waking up in middle of night with pain decreased >/=50%   Time 8   Period Weeks   Status Achieved     PT LONG TERM GOAL #5   Title reduction of pain by end of day >/= 50% due to increase tissue mobility   Time 8   Period Weeks   Status On-going           Long Term Clinic Goals - 09/04/16 1342      CC Long Term Goal  #2   Title Pt. will report swelling is adequately managed to enable ADL function at a consistent level.   Baseline We have continued to keep swelling under control and prevented it from increasing her pain level related to that.   Status On-going     CC Long Term Goal  #4   Title Pain/discomfort at right axilla area will be controlled at 6/10 or less.   Baseline We have been able to maintain this level with therapy two times/week.   Status On-going     CC Long Term Goal  #5   Title Patient will avoid infection by ongoing management of her lymphedema at right breast/axilla/upper arm areas.   Baseline She has not had any cellulitis related to lymphedema.   Status On-going     CC Long Term Goal  #6   Title Pain in right groin/ischial tuberosity area will be controlled at 3/10 level or less to enable walking with less pain.   Status Achieved            Plan - 09/04/16 1341    Clinical Impression Statement Pt.'s leg swelling appears to be less than earlier this week.  She continues to benefit from therapy to minimize lymphedema at right axilla and UE and recently to help with soft tissue pain at upper back, largely due to muscle tension.   Rehab Potential Good   Clinical Impairments Affecting Rehab Potential active cancer   PT Frequency 2x / week   PT Duration 12 weeks   PT Treatment/Interventions ADLs/Self Care Home Management;Therapeutic activities;Therapeutic exercise;Patient/family  education;Manual techniques;Manual lymph drainage;Scar mobilization   PT Next Visit Plan manual lymph drainage, soft tissue, and myofascial release   Consulted and Agree with Plan of Care Patient      Patient will benefit from skilled therapeutic intervention in order to improve the following deficits and impairments:  Increased edema, Increased fascial restricitons, Pain  Visit Diagnosis: Lymphedema, not elsewhere classified  Other muscle spasm     Problem List Patient Active Problem List   Diagnosis Date Noted  . Cholelithiasis   . Jaundice   . RUQ pain 08/26/2016  . Liver metastases ([place]) 02/23/2016  . 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 Zoster 04/04/2015  . Nausea with vomiting 11/18/2014  . Constipation 11/18/2014  . Left-sided thoracic back pain   . Bone metastases ([place]) 11/16/2014  . Back pain 11/15/2014  . Uncontrolled pain 11/14/2014  . Post-lymphadenectomy lymphedema of arm 05/31/2014  . Chest wall pain 03/21/2014  . Abnormal LFTs (liver function tests) 09/12/2013  . Malignant neoplasm of upper-inner quadrant of right breast in female, estrogen receptor positive ([place]) 08/18/2013  . 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

## 2016-09-05 ENCOUNTER — Inpatient Hospital Stay (HOSPITAL_COMMUNITY)
Admission: AD | Admit: 2016-09-05 | Discharge: 2016-09-11 | DRG: 445 | Disposition: A | Discharge status: Home Health | Payer: 59 | Source: Ambulatory Visit | Attending: Oncology | Admitting: Oncology

## 2016-09-05 ENCOUNTER — Other Ambulatory Visit (HOSPITAL_COMMUNITY): Payer: Self-pay | Admitting: Interventional Radiology

## 2016-09-05 ENCOUNTER — Other Ambulatory Visit: Payer: Self-pay | Admitting: Oncology

## 2016-09-05 ENCOUNTER — Other Ambulatory Visit (HOSPITAL_BASED_OUTPATIENT_CLINIC_OR_DEPARTMENT_OTHER): Payer: 59

## 2016-09-05 ENCOUNTER — Telehealth: Payer: Self-pay | Admitting: *Deleted

## 2016-09-05 ENCOUNTER — Encounter (HOSPITAL_COMMUNITY): Payer: Self-pay | Admitting: Radiology

## 2016-09-05 ENCOUNTER — Other Ambulatory Visit: Payer: 59

## 2016-09-05 ENCOUNTER — Inpatient Hospital Stay: Admission: AD | Admit: 2016-09-05 | Payer: 59 | Source: Ambulatory Visit | Admitting: Oncology

## 2016-09-05 ENCOUNTER — Encounter (HOSPITAL_COMMUNITY): Payer: Self-pay

## 2016-09-05 ENCOUNTER — Ambulatory Visit (HOSPITAL_COMMUNITY)
Admission: RE | Admit: 2016-09-05 | Discharge: 2016-09-05 | Disposition: A | Discharge status: Home / Self Care | Payer: 59 | Source: Ambulatory Visit | Attending: Interventional Radiology | Admitting: Interventional Radiology

## 2016-09-05 ENCOUNTER — Other Ambulatory Visit: Payer: Self-pay | Admitting: *Deleted

## 2016-09-05 ENCOUNTER — Ambulatory Visit (HOSPITAL_COMMUNITY)
Admission: RE | Admit: 2016-09-05 | Discharge: 2016-09-05 | Disposition: A | Discharge status: Home / Self Care | Payer: 59 | Source: Ambulatory Visit | Attending: Oncology | Admitting: Oncology

## 2016-09-05 DIAGNOSIS — I739 Peripheral vascular disease, unspecified: Secondary | ICD-10-CM | POA: Diagnosis present

## 2016-09-05 DIAGNOSIS — Z170 Estrogen receptor positive status [ER+]: Secondary | ICD-10-CM | POA: Diagnosis not present

## 2016-09-05 DIAGNOSIS — R601 Generalized edema: Secondary | ICD-10-CM | POA: Diagnosis present

## 2016-09-05 DIAGNOSIS — K7689 Other specified diseases of liver: Secondary | ICD-10-CM | POA: Diagnosis present

## 2016-09-05 DIAGNOSIS — R112 Nausea with vomiting, unspecified: Secondary | ICD-10-CM

## 2016-09-05 DIAGNOSIS — E871 Hypo-osmolality and hyponatremia: Secondary | ICD-10-CM | POA: Diagnosis not present

## 2016-09-05 DIAGNOSIS — E440 Moderate protein-calorie malnutrition: Secondary | ICD-10-CM | POA: Diagnosis present

## 2016-09-05 DIAGNOSIS — R7989 Other specified abnormal findings of blood chemistry: Secondary | ICD-10-CM

## 2016-09-05 DIAGNOSIS — R945 Abnormal results of liver function studies: Secondary | ICD-10-CM

## 2016-09-05 DIAGNOSIS — K7290 Hepatic failure, unspecified without coma: Secondary | ICD-10-CM | POA: Diagnosis present

## 2016-09-05 DIAGNOSIS — R1011 Right upper quadrant pain: Secondary | ICD-10-CM

## 2016-09-05 DIAGNOSIS — C787 Secondary malignant neoplasm of liver and intrahepatic bile duct: Secondary | ICD-10-CM

## 2016-09-05 DIAGNOSIS — C771 Secondary and unspecified malignant neoplasm of intrathoracic lymph nodes: Secondary | ICD-10-CM

## 2016-09-05 DIAGNOSIS — M8580 Other specified disorders of bone density and structure, unspecified site: Secondary | ICD-10-CM | POA: Diagnosis present

## 2016-09-05 DIAGNOSIS — Z9221 Personal history of antineoplastic chemotherapy: Secondary | ICD-10-CM | POA: Diagnosis not present

## 2016-09-05 DIAGNOSIS — Z91048 Other nonmedicinal substance allergy status: Secondary | ICD-10-CM

## 2016-09-05 DIAGNOSIS — C7951 Secondary malignant neoplasm of bone: Secondary | ICD-10-CM

## 2016-09-05 DIAGNOSIS — Z86718 Personal history of other venous thrombosis and embolism: Secondary | ICD-10-CM

## 2016-09-05 DIAGNOSIS — Z888 Allergy status to other drugs, medicaments and biological substances status: Secondary | ICD-10-CM

## 2016-09-05 DIAGNOSIS — C50411 Malignant neoplasm of upper-outer quadrant of right female breast: Secondary | ICD-10-CM | POA: Diagnosis present

## 2016-09-05 DIAGNOSIS — Z803 Family history of malignant neoplasm of breast: Secondary | ICD-10-CM

## 2016-09-05 DIAGNOSIS — K760 Fatty (change of) liver, not elsewhere classified: Secondary | ICD-10-CM | POA: Diagnosis present

## 2016-09-05 DIAGNOSIS — Z6827 Body mass index (BMI) 27.0-27.9, adult: Secondary | ICD-10-CM | POA: Diagnosis not present

## 2016-09-05 DIAGNOSIS — R52 Pain, unspecified: Secondary | ICD-10-CM

## 2016-09-05 DIAGNOSIS — Z791 Long term (current) use of non-steroidal anti-inflammatories (NSAID): Secondary | ICD-10-CM

## 2016-09-05 DIAGNOSIS — Z79899 Other long term (current) drug therapy: Secondary | ICD-10-CM

## 2016-09-05 DIAGNOSIS — C782 Secondary malignant neoplasm of pleura: Secondary | ICD-10-CM | POA: Diagnosis present

## 2016-09-05 DIAGNOSIS — R17 Unspecified jaundice: Secondary | ICD-10-CM | POA: Diagnosis not present

## 2016-09-05 DIAGNOSIS — R110 Nausea: Secondary | ICD-10-CM | POA: Diagnosis not present

## 2016-09-05 DIAGNOSIS — G893 Neoplasm related pain (acute) (chronic): Secondary | ICD-10-CM | POA: Diagnosis not present

## 2016-09-05 DIAGNOSIS — E46 Unspecified protein-calorie malnutrition: Secondary | ICD-10-CM | POA: Diagnosis not present

## 2016-09-05 DIAGNOSIS — R0789 Other chest pain: Secondary | ICD-10-CM

## 2016-09-05 DIAGNOSIS — K5900 Constipation, unspecified: Secondary | ICD-10-CM | POA: Diagnosis present

## 2016-09-05 DIAGNOSIS — Z66 Do not resuscitate: Secondary | ICD-10-CM | POA: Diagnosis present

## 2016-09-05 DIAGNOSIS — C50211 Malignant neoplasm of upper-inner quadrant of right female breast: Secondary | ICD-10-CM

## 2016-09-05 DIAGNOSIS — Z885 Allergy status to narcotic agent status: Secondary | ICD-10-CM

## 2016-09-05 DIAGNOSIS — Z7952 Long term (current) use of systemic steroids: Secondary | ICD-10-CM

## 2016-09-05 DIAGNOSIS — Z825 Family history of asthma and other chronic lower respiratory diseases: Secondary | ICD-10-CM

## 2016-09-05 DIAGNOSIS — E876 Hypokalemia: Secondary | ICD-10-CM | POA: Diagnosis present

## 2016-09-05 DIAGNOSIS — Z923 Personal history of irradiation: Secondary | ICD-10-CM

## 2016-09-05 DIAGNOSIS — R109 Unspecified abdominal pain: Secondary | ICD-10-CM | POA: Diagnosis present

## 2016-09-05 DIAGNOSIS — K831 Obstruction of bile duct: Secondary | ICD-10-CM | POA: Diagnosis present

## 2016-09-05 DIAGNOSIS — E8809 Other disorders of plasma-protein metabolism, not elsewhere classified: Secondary | ICD-10-CM | POA: Diagnosis present

## 2016-09-05 DIAGNOSIS — I890 Lymphedema, not elsewhere classified: Secondary | ICD-10-CM | POA: Diagnosis present

## 2016-09-05 HISTORY — PX: IR FLUORO GUIDE CV LINE LEFT: IMG2282

## 2016-09-05 HISTORY — PX: IR US GUIDE VASC ACCESS LEFT: IMG2389

## 2016-09-05 LAB — CBC WITH DIFFERENTIAL/PLATELET
BASO%: 0.2 % (ref 0.0–2.0)
BASOS ABS: 0 10*3/uL (ref 0.0–0.1)
EOS ABS: 0.1 10*3/uL (ref 0.0–0.5)
EOS%: 1.1 % (ref 0.0–7.0)
HEMATOCRIT: 34.1 % — AB (ref 34.8–46.6)
HEMOGLOBIN: 11.7 g/dL (ref 11.6–15.9)
LYMPH#: 1.3 10*3/uL (ref 0.9–3.3)
LYMPH%: 10.9 % — ABNORMAL LOW (ref 14.0–49.7)
MCH: 34.7 pg — AB (ref 25.1–34.0)
MCHC: 34.3 g/dL (ref 31.5–36.0)
MCV: 101.2 fL — AB (ref 79.5–101.0)
MONO#: 0.8 10*3/uL (ref 0.1–0.9)
MONO%: 6.6 % (ref 0.0–14.0)
NEUT#: 9.6 10*3/uL — ABNORMAL HIGH (ref 1.5–6.5)
NEUT%: 81.2 % — ABNORMAL HIGH (ref 38.4–76.8)
Platelets: 469 10*3/uL — ABNORMAL HIGH (ref 145–400)
RBC: 3.37 10*6/uL — ABNORMAL LOW (ref 3.70–5.45)
RDW: 16.9 % — AB (ref 11.2–14.5)
WBC: 11.8 10*3/uL — ABNORMAL HIGH (ref 3.9–10.3)

## 2016-09-05 LAB — PROTIME-INR
INR: 1.3 — ABNORMAL LOW (ref 2.00–3.50)
Protime: 15.6 Seconds — ABNORMAL HIGH (ref 10.6–13.4)

## 2016-09-05 LAB — COMPREHENSIVE METABOLIC PANEL
ALBUMIN: 2.7 g/dL — AB (ref 3.5–5.0)
ALK PHOS: 899 U/L — AB (ref 40–150)
ALT: 719 U/L (ref 0–55)
AST: 439 U/L (ref 5–34)
Anion Gap: 12 mEq/L — ABNORMAL HIGH (ref 3–11)
BUN: 17 mg/dL (ref 7.0–26.0)
CALCIUM: 9.1 mg/dL (ref 8.4–10.4)
CO2: 26 mEq/L (ref 22–29)
Chloride: 94 mEq/L — ABNORMAL LOW (ref 98–109)
Creatinine: 0.8 mg/dL (ref 0.6–1.1)
EGFR: 86 mL/min/{1.73_m2} — ABNORMAL LOW (ref >90)
Glucose: 79 mg/dl (ref 70–140)
POTASSIUM: 3.3 meq/L — AB (ref 3.5–5.1)
Sodium: 132 mEq/L — ABNORMAL LOW (ref 136–145)
Total Bilirubin: 19.4 mg/dL (ref 0.20–1.20)
Total Protein: 5.8 g/dL — ABNORMAL LOW (ref 6.4–8.3)

## 2016-09-05 LAB — TECHNOLOGIST REVIEW

## 2016-09-05 MED ORDER — SODIUM CHLORIDE 0.9 % IV SOLN
INTRAVENOUS | Status: DC
  Administered 2016-09-05: 1000 mL via INTRAVENOUS
  Administered 2016-09-06 – 2016-09-08 (×5): via INTRAVENOUS
  Administered 2016-09-09: 1000 mL via INTRAVENOUS

## 2016-09-05 MED ORDER — IOPAMIDOL (ISOVUE-370) INJECTION 76%
100.0000 mL | Freq: Once | INTRAVENOUS | Status: AC | PRN
  Administered 2016-09-05: 100 mL via INTRAVENOUS

## 2016-09-05 MED ORDER — ZOLPIDEM TARTRATE 5 MG PO TABS
5.0000 mg | ORAL_TABLET | Freq: Every evening | ORAL | Status: DC | PRN
  Filled 2016-09-05: qty 1

## 2016-09-05 MED ORDER — LIDOCAINE HCL 1 % IJ SOLN
INTRAMUSCULAR | Status: AC | PRN
  Administered 2016-09-05: 5 mL

## 2016-09-05 MED ORDER — ALUM & MAG HYDROXIDE-SIMETH 200-200-20 MG/5ML PO SUSP
30.0000 mL | Freq: Four times a day (QID) | ORAL | Status: DC | PRN
  Administered 2016-09-06: 30 mL via ORAL
  Filled 2016-09-05 (×2): qty 30

## 2016-09-05 MED ORDER — KETOROLAC TROMETHAMINE 15 MG/ML IJ SOLN
15.0000 mg | Freq: Four times a day (QID) | INTRAMUSCULAR | Status: AC | PRN
  Administered 2016-09-05 – 2016-09-09 (×11): 15 mg via INTRAVENOUS
  Filled 2016-09-05 (×11): qty 1

## 2016-09-05 MED ORDER — LIDOCAINE HCL 1 % IJ SOLN
INTRAMUSCULAR | Status: AC
  Filled 2016-09-05: qty 20

## 2016-09-05 MED ORDER — DOCUSATE SODIUM 100 MG PO CAPS
100.0000 mg | ORAL_CAPSULE | Freq: Two times a day (BID) | ORAL | Status: DC
  Administered 2016-09-05 – 2016-09-11 (×11): 100 mg via ORAL
  Filled 2016-09-05 (×12): qty 1

## 2016-09-05 MED ORDER — PROMETHAZINE HCL 25 MG PO TABS
12.5000 mg | ORAL_TABLET | Freq: Four times a day (QID) | ORAL | Status: DC | PRN
  Administered 2016-09-06: 12.5 mg via ORAL
  Filled 2016-09-05: qty 1

## 2016-09-05 MED ORDER — ENOXAPARIN SODIUM 40 MG/0.4ML ~~LOC~~ SOLN: 40.0000 mg | SUBCUTANEOUS | Status: DC

## 2016-09-05 MED ORDER — LORAZEPAM 0.5 MG PO TABS
0.5000 mg | ORAL_TABLET | Freq: Four times a day (QID) | ORAL | Status: DC | PRN
  Administered 2016-09-06: 0.5 mg via ORAL
  Filled 2016-09-05: qty 1

## 2016-09-05 MED ORDER — LORAZEPAM 1 MG PO TABS
1.0000 mg | ORAL_TABLET | Freq: Once | ORAL | Status: AC
  Administered 2016-09-05: 1 mg via ORAL

## 2016-09-05 MED ORDER — IOPAMIDOL (ISOVUE-370) INJECTION 76%
INTRAVENOUS | Status: AC
  Filled 2016-09-05: qty 100

## 2016-09-05 MED ORDER — TRAMADOL HCL 50 MG PO TABS
50.0000 mg | ORAL_TABLET | Freq: Four times a day (QID) | ORAL | Status: DC | PRN
  Administered 2016-09-06 – 2016-09-07 (×3): 50 mg via ORAL
  Filled 2016-09-05 (×3): qty 1

## 2016-09-05 MED ORDER — POLYETHYLENE GLYCOL 3350 17 G PO PACK: 17.0000 g | PACK | Freq: Every day | ORAL | Status: DC | PRN

## 2016-09-05 MED ORDER — PIPERACILLIN-TAZOBACTAM 3.375 G IVPB
3.3750 g | Freq: Once | INTRAVENOUS | Status: AC
  Administered 2016-09-06: 3.375 g via INTRAVENOUS

## 2016-09-05 NOTE — H&P (Signed)
[place]  [phone] [phone]     ID: [name] [name] DOB: 1960/08/02  MR#: [id number]  WNI#:[id number]  Patient Care Team: Jana Hakim Virgie Dad, MD as PCP - General (Oncology) Medea Deines, Virgie Dad, MD as Consulting Physician (Oncology) [name], MD (Obstetrics and Gynecology) Deatra Ina, MD as Referring Physician (Internal Medicine) Esaw Dace, MD as Referring Physician (Internal Medicine) Gery Pray, MD as Consulting Physician (Radiation Oncology) Laureen Abrahams, RN as Registered Nurse Sandi Mariscal, MD as Consulting Physician (Interventional Radiology) Chauncey Cruel, MD OTHER MD:  CHIEF COMPLAINT: 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 She also receives denosumab/Xgeva for her bony metastatic disease. Specifically, her most recent denosumab and fulvestrant treatment was 08/01/2016.  Despite these treatments there was evidence of disease progression so after much discussion she received a single dose of pembrolizumab on 07/29/2016.  While she tolerated this treatment well, there was significant deterioration of her liver function in the following weeks leading to admission 08/26/2016 for what was felt possibly to be cholecystitis. Further evaluation showed instead progression of her liver metastases with intra hepatic cholestasis. We discussed proceeding to percutaneous drainage at that time, but we felt there was a small chance what we were dealing with was immune damage to the liver by the pembrolizumab, so she receives steroids for 5 days.  Unfortunately her bilirubin has continued to rise so that at this  point her total bilirubin is 19.4, up from 15.383 days ago. Her alkaline phosphatase is also up to 899, from 743 3 days ago. The ALT is 719, up from 731 and the AST is stable at 439, up from 4:30. Albumin is 2.7 prothrombin time is 1.3  She is being admitted with a view to percutaneous drainage of her intrahepatic cholestasis scheduled for 09/06/2016  REVIEW OF SYSTEMS: Remarkably [name] [name] only exhibits mild jaundice. She has had no pruritus so far. She does have abdominal pain and discomfort but this is very variable. Also her bowel movements are very variable. Lately may have been "silver colored", but she denies any change in her irritable bowel pattern of bowel movements. Her urine is dark but not chocolate or coke-colored. There have been no bleeding or fever complications. A detailed review of systems today was otherwise stable  BREAST CANCER HISTORY: From doctor [name]'s intake note 03/20/2004:  "The patient is a very pleasant 56 year old female, without significant past medical history.  Her family history is significant for a sister who at age 21 was diagnosed with invasive ductal carcinoma.  She is a breast cancer survivor at age 72 now.  The patient states that she has never really had a screening mammogram until October 2005, when she felt that it was time for her to start having mammograms done on a yearly basis.  Therefore, on 01/26/04, she underwent a screening mammogram and an abnormality was detected in the upper outer right breast.  She, therefore, underwent spot compression views of both the right and the left breast.  The left breast revealed a well-defined mass in the upper outer left quadrant, present at the 2 o'clock position, measuring 1.8 cm, 6 cm from the nipple.  This, by ultrasound, was felt to be a simple cyst measuring 1.8 cm.  On the right breast,  a spiculated mass was noted in the upper outer right quadrant.  The ultrasound revealed a shadowing irregular solid mass at the  10:30 position, 9 cm from the nipple, measuring 1.2 cm in greatest dimension, correlating with the spiculated mass seen on the mammogram.  The right axilla was negative ultrasonically.  Because of this, the patient underwent a needle biopsy of the right breast and the biopsy was positive invasive mammary carcinoma that showed features consistent with a high-grade invasive ductal carcinoma associated with desmoplastic stroma.  No in situ component was seen and no definite lymphovascular invasion was identified.  On the core biopsy, the tumor measured about 0.8 cm.  Because of this, she was seen by Dr. [name] and the patient was taken to the [facility] on March 15, 2004.  She underwent a right breast lumpectomy with sentinel node biopsy.  The final pathology revealed an invasive ductal carcinoma, measuring 1.7 cm, grade 2 of 3.  Margins were free of tumor.  Atypical lobular hyperplasia was noted.  One sentinel node was removed which was negative for metastatic disease.  The tumor was staged at T1c, N0 MX.  It was estrogen receptor positive, progesterone receptor positive.  HER-2/neu was 2+.  FISH was negative.  All margins were free of tumor.  She is now seen in Medical Oncology for further evaluation and management of this newly diagnosed T1c, node negative, stage I, invasive ductal carcinoma of the right breast."  Her subsequent history is as detailed below    PAST MEDICAL HISTORY: Past Medical History:  Diagnosis Date  . Bone metastases ([place]) dx'd 05/2014  . Breast cancer ([place]) dx'd 2005/2011  . Peripheral vascular disease ([place]) 02/2010   blood clot related to porta cath  . PONV (postoperative nausea and vomiting)   . S/P radiation therapy 07/17/2014 through 08/02/2014    Left mediastinum, left seventh rib 3250 cGy in 13 sessions   . S/P radiation therapy 12/11/2014 through  12/22/2014    Left parietal calvarium 2400 cGy in 8 sessions   . Seizures ([place]) 2010   Isolated incident.    PAST SURGICAL HISTORY: Past Surgical History:  Procedure Laterality Date  . AXILLARY LYMPH NODE DISSECTION  Dec. 2011  . BREAST LUMPECTOMY  2005  . IR FLUORO GUIDE CV LINE LEFT  09/05/2016  . IR GENERIC HISTORICAL  05/21/2016   IR RADIOLOGIST EVAL & MGMT 05/21/2016 Sandi Mariscal, MD [facility] INTERV RAD  . IR US GUIDE VASC ACCESS LEFT  09/05/2016  . MEDIASTINOTOMY CHAMBERLAIN MCNEIL Left 06/02/2013   Procedure: MEDIASTINOTOMY CHAMBERLAIN MCNEIL;  Surgeon: [name], MD;  Location: [facility];  Service: Thoracic;  Laterality: Left;  LEFT ANTERIOR MEDIASTINOTOMY   . PORTACATH PLACEMENT  12/11  . removal portacath      FAMILY HISTORY Family History  Problem Relation Age of Onset  . COPD Mother   . Breast cancer Sister 48  The patient's father is living, 32 years old as of may 2015. He lives in [place]. The patient's mother died from complications of COPD at the age of 80. These has 2 brothers, one sister. Her sister developed breast cancer at the age of 70. She is doing well. The patient herself underwent genetic testing at [facility] in 2011 and was found to be BRCA negative  GYNECOLOGIC HISTORY:  No LMP recorded. Patient is not currently having periods (Reason: Chemotherapy). Menarche age 68, she is GX P0. She stopped having periods with her initial chemotherapy in 2006.  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 Her husband [name] recently retired as Medical illustrator in [place]. He has a child from a prior marriage. At home they have 2 rescue dogs, Hobo and Salix. The patient is religious but not a church attender    ADVANCED DIRECTIVES: In place; at the 08/04/2014 visit in particular the patient was very clear, with her husband  present, that she would not want any kind of feeding tubes or "other tubes" if her condition deteriorated.   HEALTH MAINTENANCE: Social History  Substance Use Topics  . Smoking status: Never Smoker  . Smokeless tobacco: Never Used  . Alcohol use No     Colonoscopy:  PAP:  Bone density:   Allergies  Allergen Reactions  . 2nd Skin Quick Heal Other (See Comments)    Other Reaction: Skin peels  . Decadron [Dexamethasone] Other (See Comments)    Patient does not tolerate steroids.   . Dilaudid [Hydromorphone] Nausea And Vomiting  . Enoxaparin Other (See Comments)    unknown  . Fluconazole Swelling    Liver toxicity  . Hydromorphone Hcl Nausea And Vomiting  . Morphine And Related Nausea And Vomiting  . Ondansetron Other (See Comments)    "makes me loopy"  . Protonix [Pantoprazole Sodium] Other (See Comments)    Patient reports it caused thrush.  . Tegaderm Ag Mesh [Silver]     Current Facility-Administered Medications  Medication Dose Route Frequency Provider Last Rate Last Dose  . [START ON 09/06/2016] piperacillin-tazobactam (ZOSYN) IVPB 3.375 g  3.375 g Intravenous Once Allred, Darrell K, PA-C       Current Outpatient Prescriptions  Medication Sig Dispense Refill  . acetaminophen (TYLENOL) 500 MG tablet Take 1 tablet (500 mg total) by mouth 4 (four) times daily -  with meals and at bedtime. 40 tablet 0  . B Complex-C (B-COMPLEX WITH VITAMIN C) tablet Take 1 tablet by mouth daily. Reported on 03/27/2015    . calcium carbonate (TUMS - DOSED IN MG ELEMENTAL CALCIUM) 500 MG chewable tablet Chew 1 tablet by mouth as directed.    . cholecalciferol 2000 UNITS tablet Take 1 tablet (2,000 Units total) by mouth daily.    . ciprofloxacin (CIPRO) 500 MG tablet Take 1 tablet (500 mg total) by mouth 2 (two) times daily. 5 tablet 0  . dexamethasone (DECADRON) 4 MG tablet Take 1 tablet (4 mg total) by mouth 2 (two) times daily. 9 tablet 0  . famotidine (PEPCID) 40 MG tablet Take 0.5-1  tablets (20-40 mg total) by mouth 2 (two) times daily. 40 mg in the morning and 20 mg in the evening 30 tablet 1  . folic acid (FOLVITE) 1 MG tablet Take 1 tablet (1 mg total) by mouth daily.    Marland Kitchen ketorolac (TORADOL) 10 MG tablet Take 1 tablet (10 mg total) by mouth every 6 (six) hours as needed. 20 tablet 0  . loratadine-pseudoephedrine (CLARITIN-D 24-HOUR) 10-240 MG 24 hr tablet Take 1 tablet by mouth daily.    Marland Kitchen LORazepam (ATIVAN) 0.5 MG tablet Take 1 tablet (0.5 mg total) by mouth every 8 (eight) hours as needed for anxiety. 30 tablet 0  . Melatonin 3 MG TABS Take 3 mg by mouth at bedtime.    . naproxen (NAPROSYN) 250 MG tablet Take 1 tablet (250 mg total) by mouth 4 (four) times daily -  with meals and at bedtime. 40 tablet 0  . phenazopyridine (PYRIDIUM) 100  MG tablet Take 1 tablet (100 mg total) by mouth 3 (three) times daily as needed for pain. (Patient not taking: Reported on 08/15/2016) 30 tablet 0  . RABEprazole Sodium 5 MG CPSP Take 5 mg by mouth daily. 30 capsule 3  . saccharomyces boulardii (FLORASTOR) 250 MG capsule Take 250 mg by mouth daily.     . vitamin E (VITAMIN E) 400 UNIT capsule Take 1 capsule (400 Units total) by mouth 2 (two) times daily. (Patient not taking: Reported on 08/15/2016) 60 capsule 3   Facility-Administered Medications Ordered in Other Encounters  Medication Dose Route Frequency Provider Last Rate Last Dose  . iopamidol (ISOVUE-370) 76 % injection             OBJECTIVE: There were no vitals filed for this visit.   There is no height or weight on file to calculate BMI.      ECOG FS:1 - Symptomatic but completely ambulatory  Ocular: Sclerae icteric, EOMs intact Ear-nose-throat: no thrush or focal lesiojns Lungs no rales or rhonchi Heart regular rate and rhythm Abd moderately distended, minimally tender in the right upper quadrant, positive bowel sounds Neuro: non-focal, well-oriented, anxious affect Breasts: Deferred   LAB RESULTS:  CMP       Component Value Date/Time   NA 132 (L) 09/05/2016 1156   K 3.3 (L) 09/05/2016 1156   CL 102 08/30/2016 0752   CL 105 05/06/2012 1333   CO2 26 09/05/2016 1156   GLUCOSE 79 09/05/2016 1156   GLUCOSE 124 (H) 05/06/2012 1333   BUN 17.0 09/05/2016 1156   CREATININE 0.8 09/05/2016 1156   CALCIUM 9.1 09/05/2016 1156   PROT 5.8 (L) 09/05/2016 1156   ALBUMIN 2.7 (L) 09/05/2016 1156   AST 439 (HH) 09/05/2016 1156   ALT 719 (HH) 09/05/2016 1156   ALKPHOS 899 (H) 09/05/2016 1156   BILITOT 19.40 (HH) 09/05/2016 1156   GFRNONAA >60 08/30/2016 0752   GFRAA >60 08/30/2016 0752    No results found for: TOTALPROTELP, ALBUMINELP, A1GS, A2GS, BETS, BETA2SER, GAMS, MSPIKE, SPEI  No results found for: Nils Pyle, [facility]  Lab Results  Component Value Date   WBC 11.8 (H) 09/05/2016   NEUTROABS 9.6 (H) 09/05/2016   HGB 11.7 09/05/2016   HCT 34.1 (L) 09/05/2016   MCV 101.2 (H) 09/05/2016   PLT 469 (H) 09/05/2016    @LASTCHEMISTRY @  Lab Results  Component Value Date   LABCA2 25 11/02/2007    No components found for: [id number]   Recent Labs Lab 09/05/16 1148  INR 1.30*    Urinalysis    Component Value Date/Time   COLORURINE AMBER (A) 08/26/2016 0334   APPEARANCEUR CLEAR 08/26/2016 0334   LABSPEC 1.015 08/26/2016 0334   LABSPEC 1.005 05/16/2016 1201   PHURINE 5.0 08/26/2016 0334   GLUCOSEU NEGATIVE 08/26/2016 0334   GLUCOSEU Negative 05/16/2016 1201   HGBUR SMALL (A) 08/26/2016 0334   BILIRUBINUR MODERATE (A) 08/26/2016 0334   BILIRUBINUR Negative 05/16/2016 1201   KETONESUR 20 (A) 08/26/2016 0334   PROTEINUR NEGATIVE 08/26/2016 0334   UROBILINOGEN 0.2 05/16/2016 1201   NITRITE NEGATIVE 08/26/2016 0334   LEUKOCYTESUR NEGATIVE 08/26/2016 0334   LEUKOCYTESUR Small 05/16/2016 1201     STUDIES: Mr Jeri Cos Wo Contrast  Result Date: 08/07/2016 CLINICAL DATA:  Breast cancer with metastatic disease. EXAM: MRI HEAD WITHOUT AND WITH CONTRAST TECHNIQUE:  Multiplanar, multiecho pulse sequences of the brain and surrounding structures were obtained without and with intravenous contrast. CONTRAST:  27m MULTIHANCE GADOBENATE DIMEGLUMINE 529  MG/ML IV SOLN COMPARISON:  CT head 03/13/2015 FINDINGS: Brain: Ventricle size normal. Negative for acute or chronic infarction. No intracranial hemorrhage or mass. No evidence of metastatic disease to the brain. Leptomeningeal enhancement is normal. Vascular: Normal arterial flow void. Skull and upper cervical spine: 2 cm enhancing mass lesion in the left parietal bone compatible with metastatic disease unchanged. 13 mm enhancing lesion in the right parietal bone anteriorly also unchanged and compatible with metastatic disease. Sinuses/Orbits: Negative Other: None IMPRESSION: Skull lesions in the parietal bone bilaterally are stable. No evidence of metastatic disease to the brain. No acute intracranial abnormality. Electronically Signed   By: Franchot Gallo M.D.   On: 08/07/2016 15:52   Nm Hepatobiliary Liver Func  Result Date: 08/26/2016 CLINICAL DATA:  Right upper quadrant pain EXAM: NUCLEAR MEDICINE HEPATOBILIARY IMAGING VIEWS: Anterior right upper quadrant RADIOPHARMACEUTICALS:  7.9 mCi Tc-26m Choletec IV COMPARISON:  Ultrasound right upper quadrant Aug 15, 2016 FINDINGS: Images were obtained serially over a 3 hour time span. Morphine was not administered due to history of allergic reaction to morphine. Liver uptake of radiotracer is inhomogeneous consistent with known liver metastases. Over a 3 hour time span, there is no appreciable visualization of either gallbladder or small bowel. IMPRESSION: Nonvisualization of gallbladder over a 3 hour time span is suggestive of cystic duct obstruction/ acute cholecystitis. Note, however, that there is also nonvisualization of small bowel after 3 hours. This finding potentially could be indicative of common bile duct obstruction. However, this finding also could be indicative of  hepatic stasis phenomenon. Serum bilirubin is moderately elevated at this time. Known liver metastases demonstrated by inhomogeneous uptake of radiotracer. Given lack of visualization of both gallbladder and small bowel, it may be prudent to consider MRCP to assess for possible common bile duct lesion as the cause for lack of small bowel visualization on this study. Electronically Signed   By: WLowella GripIII M.D.   On: 08/26/2016 16:13   UKoreaAbdomen Complete  Result Date: 08/26/2016 CLINICAL DATA:  RIGHT upper quadrant pain beginning yesterday. Jaundice. History of metastatic breast cancer with known liver metastasis. EXAM: ABDOMEN ULTRASOUND COMPLETE COMPARISON:  Abdominal ultrasound Aug 15, 2016 and MRI of the abdomen July 24, 2016 FINDINGS: Gallbladder: 1.9 cm echogenic gallstone with acoustic shadowing surrounded by mildly echogenic sludge with increased through transmission. Mild gallbladder wall thickening without pericholecystic fluid. Sonographic Murphy's sign elicited. Common bile duct: Diameter: 5 mm Liver: Multiple heterogeneous liver mass is consistent with patient's known metastasis. Porta hepatis lymphadenopathy present though, better characterized on prior MRI. IVC: No abnormality visualized. Pancreas: Visualized portion unremarkable. Spleen: Size and appearance within normal limits. Right Kidney: Length: 11.5 cm. Echogenicity within normal limits. No mass or hydronephrosis visualized. Left Kidney: Length: 11.8 cm. Echogenicity within normal limits. No mass or hydronephrosis visualized. Abdominal aorta: No aneurysm visualized. Other findings: None. IMPRESSION: Cholelithiasis and sonographic findings of acute cholecystitis. Numerous hepatic and nodal metastasis. Electronically Signed   By: CElon AlasM.D.   On: 08/26/2016 06:36   Mr 3d Recon At Scanner  Result Date: 08/29/2016 CLINICAL DATA:  Metastatic breast cancer with liver metastasis. Evaluate for metastasis versus biliary  obstruction. New chemotherapy. Jaundice. EXAM: MRI ABDOMEN WITHOUT AND WITH CONTRAST (INCLUDING MRCP) TECHNIQUE: Multiplanar multisequence MR imaging of the abdomen was performed both before and after the administration of intravenous contrast. Heavily T2-weighted images of the biliary and pancreatic ducts were obtained, and three-dimensional MRCP images were rendered by post processing. CONTRAST:  134mMULTIHANCE GADOBENATE  DIMEGLUMINE 529 MG/ML IV SOLN COMPARISON:  Ultrasound 08/26/2016.  Most recent MRI of 07/24/2016. FINDINGS: Portions of exam are minimally motion degraded. Lower chest: Mild cardiomegaly. Left-sided pleural thickening remains. Index enhancing left pleural implant measures 1.4 cm on image 19/ series 11001. Compare 1.5 cm on the prior exam. More anterior implant measures 1.5 cm on image 20/ series 1101 compare 1.4 cm on the prior. Hepatobiliary: Extensive hepatic metastasis. Overall mild to (given short interval ) moderate progression. Example central right hepatic lobe lesion at 3.3 x 3.8 cm on image 47/ series 11001. Compare 2.8 x 3.5 cm on the prior. Lateral segment left liver lobe lesion measures 3.1 cm on image 49/ series 11001. Compare 3.0 cm on the prior. A pericholecystic right liver lobe lesion measures 4.7 x 4.0 cm on image 60/ series 11001. Compare 3.8 x 2.9 cm on the prior. Interval development of moderate intrahepatic biliary duct dilatation bilaterally. This continues to the level of the porta hepatis. Example image 89/series 4 and image 80/series 400. Amorphous soft tissue signal in this region is likely the cause, secondary to extension of porta hepatis infiltrative adenopathy. Example image 31/series 3 and image 49/ series 11002. A gallstone is again identified. There is moderate gallbladder wall thickening on image 36/series 3. The distal common duct is normal in caliber. Pancreas:  Normal, without mass or ductal dilatation. Spleen:  Normal in size, without focal abnormality.  Adrenals/Urinary Tract: Normal adrenal glands. Normal kidneys, without hydronephrosis. Stomach/Bowel: Normal stomach and abdominal bowel loops. Vascular/Lymphatic: Aortic atherosclerosis. Circumaortic left renal vein. Porta hepatis adenopathy is progressive as detailed above. Example component in the portal caval space at 1.6 cm on image 31/series 3. Compare 1.4 cm on the prior. Progressive retroperitoneal adenopathy. A new node in the aortocaval space measures 1.3 cm on image 39/series 3. Other:  Trace perihepatic ascites. Musculoskeletal: Re- demonstration of osseous metastasis. Example lesion with the T11 vertebral body which measures 1.5 cm on image 22/series 12. Compare 1.4 cm on the prior. The inferior L3 lesion measures 2.8 x 1.6 cm on image 28/series 12 versus 2.9 x 1.8 cm on the prior. IMPRESSION: 1. Progression of hepatic metastasis. 2. Progressive adenopathy within the porta hepatis with increased retroperitoneal nodal metastasis. 3. Development of moderate intrahepatic biliary duct dilatation, secondary to soft tissue fullness in the porta hepatis, favored to related to infiltrative progressive adenopathy. Normal caliber common duct distally. 4. Cholelithiasis. Gallbladder wall thickening is nonspecific. Please see prior ultrasound. 5. Similar osseous and left pleural metastasis. Findings were reported to Dr. Loletha Carrow at 1:35 p.m. Electronically Signed   By: Abigail Miyamoto M.D.   On: 08/29/2016 13:36   Ir Fluoro Guide Cv Line Left  Result Date: 09/05/2016 INDICATION: Metastatic breast cancer to liver, poor venous access; request made for central venous access . EXAM: LEFT UPPER EXTREMITY PICC LINE PLACEMENT WITH ULTRASOUND AND FLUOROSCOPIC GUIDANCE MEDICATIONS: None ANESTHESIA/SEDATION: None FLUOROSCOPY TIME:  Fluoroscopy Time:  42 seconds COMPLICATIONS: None immediate. PROCEDURE: The patient was advised of the possible risks and complications and agreed to undergo the procedure. The patient was then  brought to the angiographic suite for the procedure. The left arm was prepped with chlorhexidine, draped in the usual sterile fashion using maximum barrier technique (cap and mask, sterile gown, sterile gloves, large sterile sheet, hand hygiene and cutaneous antisepsis) and infiltrated locally with 1% Lidocaine. Ultrasound demonstrated patency of the left brachial vein, and this was documented with an image. Under real-time ultrasound guidance, this vein was accessed with a  21 gauge micropuncture needle and image documentation was performed. A 0.018 wire was introduced in to the vein. Over this, a 5 [place] double lumen power injectable PICC was advanced to the lower SVC/right atrial junction. Fluoroscopy during the procedure and fluoro spot radiograph confirms appropriate catheter position. The catheter was flushed and covered with a sterile dressing. Catheter length:  41 cm IMPRESSION: Successful left arm power PICC line placement with ultrasound and fluoroscopic guidance. The catheter is ready for use. Read by: Rowe Robert, PA-C Electronically Signed   By: Markus Daft M.D.   On: 09/05/2016 16:00   Ir US Guide Vasc Access Left  Result Date: 09/05/2016 INDICATION: Metastatic breast cancer to liver, poor venous access; request made for central venous access . EXAM: LEFT UPPER EXTREMITY PICC LINE PLACEMENT WITH ULTRASOUND AND FLUOROSCOPIC GUIDANCE MEDICATIONS: None ANESTHESIA/SEDATION: None FLUOROSCOPY TIME:  Fluoroscopy Time:  42 seconds COMPLICATIONS: None immediate. PROCEDURE: The patient was advised of the possible risks and complications and agreed to undergo the procedure. The patient was then brought to the angiographic suite for the procedure. The left arm was prepped with chlorhexidine, draped in the usual sterile fashion using maximum barrier technique (cap and mask, sterile gown, sterile gloves, large sterile sheet, hand hygiene and cutaneous antisepsis) and infiltrated locally with 1% Lidocaine.  Ultrasound demonstrated patency of the left brachial vein, and this was documented with an image. Under real-time ultrasound guidance, this vein was accessed with a 21 gauge micropuncture needle and image documentation was performed. A 0.018 wire was introduced in to the vein. Over this, a 5 [place] double lumen power injectable PICC was advanced to the lower SVC/right atrial junction. Fluoroscopy during the procedure and fluoro spot radiograph confirms appropriate catheter position. The catheter was flushed and covered with a sterile dressing. Catheter length:  41 cm IMPRESSION: Successful left arm power PICC line placement with ultrasound and fluoroscopic guidance. The catheter is ready for use. Read by: Rowe Robert, PA-C Electronically Signed   By: Markus Daft M.D.   On: 09/05/2016 16:00   Mr Abdomen Mrcp W Wo Contast  Result Date: 08/29/2016 CLINICAL DATA:  Metastatic breast cancer with liver metastasis. Evaluate for metastasis versus biliary obstruction. New chemotherapy. Jaundice. EXAM: MRI ABDOMEN WITHOUT AND WITH CONTRAST (INCLUDING MRCP) TECHNIQUE: Multiplanar multisequence MR imaging of the abdomen was performed both before and after the administration of intravenous contrast. Heavily T2-weighted images of the biliary and pancreatic ducts were obtained, and three-dimensional MRCP images were rendered by post processing. CONTRAST:  9m MULTIHANCE GADOBENATE DIMEGLUMINE 529 MG/ML IV SOLN COMPARISON:  Ultrasound 08/26/2016.  Most recent MRI of 07/24/2016. FINDINGS: Portions of exam are minimally motion degraded. Lower chest: Mild cardiomegaly. Left-sided pleural thickening remains. Index enhancing left pleural implant measures 1.4 cm on image 19/ series 11001. Compare 1.5 cm on the prior exam. More anterior implant measures 1.5 cm on image 20/ series 1101 compare 1.4 cm on the prior. Hepatobiliary: Extensive hepatic metastasis. Overall mild to (given short interval ) moderate progression. Example central  right hepatic lobe lesion at 3.3 x 3.8 cm on image 47/ series 11001. Compare 2.8 x 3.5 cm on the prior. Lateral segment left liver lobe lesion measures 3.1 cm on image 49/ series 11001. Compare 3.0 cm on the prior. A pericholecystic right liver lobe lesion measures 4.7 x 4.0 cm on image 60/ series 11001. Compare 3.8 x 2.9 cm on the prior. Interval development of moderate intrahepatic biliary duct dilatation bilaterally. This continues to the level of the porta  hepatis. Example image 89/series 4 and image 80/series 400. Amorphous soft tissue signal in this region is likely the cause, secondary to extension of porta hepatis infiltrative adenopathy. Example image 31/series 3 and image 49/ series 11002. A gallstone is again identified. There is moderate gallbladder wall thickening on image 36/series 3. The distal common duct is normal in caliber. Pancreas:  Normal, without mass or ductal dilatation. Spleen:  Normal in size, without focal abnormality. Adrenals/Urinary Tract: Normal adrenal glands. Normal kidneys, without hydronephrosis. Stomach/Bowel: Normal stomach and abdominal bowel loops. Vascular/Lymphatic: Aortic atherosclerosis. Circumaortic left renal vein. Porta hepatis adenopathy is progressive as detailed above. Example component in the portal caval space at 1.6 cm on image 31/series 3. Compare 1.4 cm on the prior. Progressive retroperitoneal adenopathy. A new node in the aortocaval space measures 1.3 cm on image 39/series 3. Other:  Trace perihepatic ascites. Musculoskeletal: Re- demonstration of osseous metastasis. Example lesion with the T11 vertebral body which measures 1.5 cm on image 22/series 12. Compare 1.4 cm on the prior. The inferior L3 lesion measures 2.8 x 1.6 cm on image 28/series 12 versus 2.9 x 1.8 cm on the prior. IMPRESSION: 1. Progression of hepatic metastasis. 2. Progressive adenopathy within the porta hepatis with increased retroperitoneal nodal metastasis. 3. Development of moderate  intrahepatic biliary duct dilatation, secondary to soft tissue fullness in the porta hepatis, favored to related to infiltrative progressive adenopathy. Normal caliber common duct distally. 4. Cholelithiasis. Gallbladder wall thickening is nonspecific. Please see prior ultrasound. 5. Similar osseous and left pleural metastasis. Findings were reported to Dr. Loletha Carrow at 1:35 p.m. Electronically Signed   By: Abigail Miyamoto M.D.   On: 08/29/2016 13:36   US Abdomen Limited Ruq  Result Date: 08/15/2016 CLINICAL DATA:  Acute right upper quadrant pain, metastatic breast cancer to the liver EXAM: US ABDOMEN LIMITED - RIGHT UPPER QUADRANT COMPARISON:  07/24/2016 FINDINGS: Gallbladder: Echogenic shadowing gallstone noted measuring 1.7 cm. Normal wall thickness measuring 2.4 mm. No Murphy's sign. No pericholecystic fluid. Negative for cholecystitis by ultrasound. Common bile duct: Diameter: 4.6 mm Liver: Numerous solid hepatic metastases again evident, largest lesions are in the right lobe measuring up to 7.8 cm in diameter. No biliary dilatation or obstruction.  No ascites. IMPRESSION: Cholelithiasis. Negative for acute cholecystitis or biliary obstruction Extensive hepatic metastases Electronically Signed   By: Jerilynn Mages.  Shick M.D.   On: 08/15/2016 11:22    ELIGIBLE FOR AVAILABLE RESEARCH PROTOCOL: no  ASSESSMENT: 56 y.o.  BRCA negative [name] [name] woman with stage IV breast cancer, history as follows  (1)  S/p Right upper inner quadrant lumpectomy and sentinel lymph node sampling 03/15/2004 for a pT1c pN0. Stage IA invasive ductal carcinoma, grade 2, estrogen receptor 95% positive, progesterone receptor 65% positive, HER-2 not amplified; additional surgery 04/25/2004 for seroma or clearance showed no residual tumor  (2) adjuvant chemotherapy with cyclophosphamide and doxorubicin every 21 days x4 completed 07/19/2004  (3) adjuvant radiation given under Dr. Donella Stade in [place] completed July 2006  (4) the patient  opted against adjuvant antiestrogen therapy  (5) genetics testing showed no BRCA mutations  (6) biopsy of a palpable right axillary mass 10/24/2009 showed invasive ductal carcinoma, grade 3, estrogen receptor 100% positive, progesterone receptor 2% positive (alert score 5) HER-2 negative; no evidence of systemic disease on PET scanning  (7) completed 3 of 4 planned cycles of docetaxel and cyclophosphamide September 2011, fourth cycle omitted because of marked elevations in liver function tests  (8) an right axillary lymph node dissection 03/06/2010 showed 3/8  lymph nodes removed to be involved by tumor, with extracapsular extension.  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, TTS-1 negative), estrogen receptor 80% positive, progesterone receptor 2% positive, HER-2 not amplified  (12) letrozole started March 2015-- tolerated with significant side effects, discontinued at the end of May 2015  (13) PET scan 08/16/2013 shows extensive left pleural metastatic disease and a large left pleural effusion that shifts cardiac and mediastinal structures to the right; adenopathy (celiac trunk, periadrenal, periaortic); and a left medial clavicular lesion; Status post left thoracentesis 08/16/2013 positive for adenocarcinoma, estrogen receptor positive, progesterone receptor negative.  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 mutation; everolimus was suggested as an available targeted agent  (18) exemestane started 03/31/2014, discontinued 10/31/2014 with evidence of progression             (a) everolimus added 04/03/2014 but not tolerated (cytopenias, elevated LFTs) even at minimal doses; stopped 04/17/2014  (19) fulvestrant started 12/20/2014             (a) palbociclib added at very low dose 04/03/2015 (starting dose 75 mg weekly)             (b) palbociclib dose gradually increased to 75 mg daily, 21/7, as of May 2017             (c) palbociclib dose increased to 100 mg daily, 21/ 7, beginning November mid- cycle             (d) palbociclib dose decreased to 75 mg daily beginning with cycle starting 05/25/2016             (e) letrozole 2.5 mg started 05/26/2016, held as of 07/25/2016 with poor tolerance             (f) palbociclib dose decreased to 75 mg every other day beginning 07/25/2016  (20) liver biopsy 03/20/2015 confirms metastatic carcinoma, still estrogen receptor positive at 100%, progesterone receptor negative, HER-2 equivocal with a signals ratio 1.41, number per cell 4.50.              (a) repeat liver biopsy December 2017 might show further changes (HER-2 positivity)?  (21) immunohistochemistry for mismatched repair protein mutations 03/20/2015 showed normal major and minor MMR proteins, with a very low probability of microsatellite instability ([id number])  (22) adjuvant radiation 12/24/15-01/02/16 Site/dose:1) Left T9 Rib / 24 Gy in 8 fx 2) Right inferior pelvis/ 24 Gy in 8 fx  (23) pembrolizumab added to her treatment program beginning 07/29/2016             (a)  liver biopsy 03/20/2015 negative for PD-1              (b) TSH on 08/01/2016 was 4.1  (  c) worsening hepatic function leading to discontinuation after single dose  (d) received prednisone x 5 days completed 09/03/2016  (24) 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  (a) admitted for consideration of percutaneous biliary drainage placement    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 treated with Lovenox for 5-6 months  (c) right upper extremity lymphedema--receiving physical therapy  (d) hepatic steatosis with chronically elevated LFTs as well as unusual hepatic sensitivity to chemotherapy  (e) osteopenia with the lowest T score -1.6 on bone density scan 06/20/2013             (i) on denosumab/ Xgeva Q28d  (f) radiation oncology (Dr Valere Dross) has reviewed prior radiation records in case there is further mediastinal involvement with dysphagia etc in which case palliative XRT could be considered             (a) radiation to left mediastinum/ left 7th rib 3250 cGy in 13 sessions04/18/2016 through 08/02/2014             (b) radiation to T11 area: 22 Gy in 7 sessions, last dose 11/27/2014             (c) radiation left parietal scalp region to be completed 12/22/2014             (d) radiation to sacral area completed 04/09/2015             (e) radiation to right inferior pelvis and left ninth rib (24 gray, 12/24/2015--01/02/2016)             (f) T11 was treated stereotactically with 14 gray in 1 fraction 03/12/2016.  (g) chest wall and perineal pain--improved post radiation treatments             (a) discussed celebrex/ carafate but demurs  (h) zoster diagnosed 04/04/2015-- on valacyclovir--resolved    PLAN: [name] [name]'s hepatic cholestasis has continued to progress, and she has agreed to attempted percutaneous drainage. This has  been scheduled for 09/06/2016.  She is being admitted today in preparation for that procedure. We have ordered a PICC line to be placed because she has significant access problems. She is also undergoing a CT angiogram of the abdomen in preparation for the procedure  She is aware of the dangers of bleeding, pain and infection. She is also concerned regarding issues of allergies to tape (she requests OpSite only) and the fact that she does become easily constipated during any procedures.  [name] [name] does have advanced directives in place and her husband [name] is her healthcare part of attorney. Nevertheless we are admitting her as a full code since she is being admitted for a procedure. This can be revised if things were to take an unfortunate course    Chauncey Cruel, MD   09/05/2016 4:37 PM Medical Oncology and Hematology [facility] [address] [place], [place] [address] Tel. [phone]    Fax. [phone]

## 2016-09-05 NOTE — Progress Notes (Signed)
Pt recently admitted to unit. PICC line in place LUA clean, dry and intact . Transparent dressing dressing in place.

## 2016-09-05 NOTE — Procedures (Signed)
Ultrasound/fluoroscopic guided placement of left double lumen brachial vein PICC. Length 41 cm. Tip SVC/right atrial junction. Okay to use. Medication used-1% lidocaine to skin and subcutaneous tissues.

## 2016-09-05 NOTE — Progress Notes (Signed)
Patient ID: [name] [name], female   DOB: 1961/01/28, 56 y.o.   MRN: [id number]    Referring Physician(s): Chauncey Cruel  Supervising Physician: Sandi Mariscal  Patient Status:  [facility] - In-pt  Chief Complaint:  Jaundice, biliary obstruction, metastatic breast cancer to liver  Subjective: Patient familiar to IR service from prior thoracenteses, as well as left Pleurx catheter placement on 09/01/13 with removal in 2016, liver biopsy in 2016; seen recently in consultation by Dr. Pascal Lux for discussion of transhepatic biliary drainage catheter for progressive hepatic metastatic disease and biliary obstruction (see his note for full details). Today she is undergoing PICC line placement as well as CT angiogram of the abdomen and pelvis for further evaluation prior to the above procedure. She will be admitted to the hospital by [facility]. She currently denies fever, headache, chest pain, cough, back pain, nausea, vomiting or abnormal bleeding. She does have some dyspnea with exertion, fatigue, jaundice, epigastric fullness/bloating.   Allergies: 2nd skin quick heal; Decadron [dexamethasone]; Dilaudid [hydromorphone]; Enoxaparin; Fluconazole; Hydromorphone hcl; Morphine and related; Ondansetron; Protonix [pantoprazole sodium]; and Tegaderm ag mesh [silver]  Medications: Prior to Admission medications   Medication Sig Start Date End Date Taking? Authorizing Provider  acetaminophen (TYLENOL) 500 MG tablet Take 1 tablet (500 mg total) by mouth 4 (four) times daily -  with meals and at bedtime. 08/30/16   Caren Griffins, MD  B Complex-C (B-COMPLEX WITH VITAMIN C) tablet Take 1 tablet by mouth daily. Reported on 03/27/2015 01/19/15   Magrinat, Virgie Dad, MD  calcium carbonate (TUMS - DOSED IN MG ELEMENTAL CALCIUM) 500 MG chewable tablet Chew 1 tablet by mouth as directed.    [provider]  cholecalciferol 2000 UNITS tablet Take 1 tablet (2,000 Units total) by mouth daily. 01/19/15   Magrinat,  Virgie Dad, MD  ciprofloxacin (CIPRO) 500 MG tablet Take 1 tablet (500 mg total) by mouth 2 (two) times daily. 08/30/16   Caren Griffins, MD  dexamethasone (DECADRON) 4 MG tablet Take 1 tablet (4 mg total) by mouth 2 (two) times daily. 08/30/16   Caren Griffins, MD  famotidine (PEPCID) 40 MG tablet Take 0.5-1 tablets (20-40 mg total) by mouth 2 (two) times daily. 40 mg in the morning and 20 mg in the evening 08/30/16   Caren Griffins, MD  folic acid (FOLVITE) 1 MG tablet Take 1 tablet (1 mg total) by mouth daily. 01/19/15   Magrinat, Virgie Dad, MD  ketorolac (TORADOL) 10 MG tablet Take 1 tablet (10 mg total) by mouth every 6 (six) hours as needed. 08/30/16   Caren Griffins, MD  loratadine-pseudoephedrine (CLARITIN-D 24-HOUR) 10-240 MG 24 hr tablet Take 1 tablet by mouth daily.    [provider]  LORazepam (ATIVAN) 0.5 MG tablet Take 1 tablet (0.5 mg total) by mouth every 8 (eight) hours as needed for anxiety. 08/30/16 08/30/17  Caren Griffins, MD  Melatonin 3 MG TABS Take 3 mg by mouth at bedtime.    [provider]  naproxen (NAPROSYN) 250 MG tablet Take 1 tablet (250 mg total) by mouth 4 (four) times daily -  with meals and at bedtime. 08/30/16   Caren Griffins, MD  phenazopyridine (PYRIDIUM) 100 MG tablet Take 1 tablet (100 mg total) by mouth 3 (three) times daily as needed for pain. Patient not taking: Reported on 08/15/2016 05/16/16   Magrinat, Virgie Dad, MD  RABEprazole Sodium 5 MG CPSP Take 5 mg by mouth daily. 10/08/15   Magrinat, Virgie Dad,  MD  saccharomyces boulardii (FLORASTOR) 250 MG capsule Take 250 mg by mouth daily.  03/23/15   Magrinat, Virgie Dad, MD  vitamin E (VITAMIN E) 400 UNIT capsule Take 1 capsule (400 Units total) by mouth 2 (two) times daily. Patient not taking: Reported on 08/15/2016 05/16/16   Kyung Rudd, MD     Vital Signs:pend   Physical Exam awake, alert. Scleral icterus/jaundiced. Chest with decreased breath sounds on left, right clear. Heart with  regular rate and rhythm. Abdomen soft, positive bowel sounds, mild epigastric tenderness to palpation; lower extremities with trace to 1+ edema bilaterally  Imaging: No results found.  Labs:  CBC:  Recent Labs  08/27/16 0404 08/30/16 0752 09/02/16 1127 09/05/16 1148  WBC 4.8 7.2 12.5* 11.8*  HGB 11.1* 11.9* 11.7 11.7  HCT 30.4* 32.8* 33.1* 34.1*  PLT 466* 514* 477* 469*    COAGS:  Recent Labs  08/27/16 0404 08/28/16 0721 08/29/16 0508 09/05/16 1148  INR 1.07 1.07 1.11 1.30*    BMP:  Recent Labs  08/27/16 0404 08/28/16 0721 08/29/16 0508 08/30/16 0752 09/02/16 1127 09/05/16 1156  NA 135 134* 135 133* 132* 132*  K 3.7 3.9 3.7 3.8 3.4* 3.3*  CL 103 103 104 102  --   --   CO2 23 22 22 24 25 26   GLUCOSE 87 103* 97 155* 138 79  BUN 7 6 <5* 5* 14.0 17.0  CALCIUM 7.6* 7.8* 7.7* 8.1* 8.5 9.1  CREATININE 0.31* 0.30* 0.33* 0.33* 0.7 0.8  GFRNONAA >60 >60 >60 >60  --   --   GFRAA >60 >60 >60 >60  --   --     LIVER FUNCTION TESTS:  Recent Labs  08/29/16 0508 08/30/16 0752 09/02/16 1127 09/05/16 1156  BILITOT 14.5* 15.2* 15.38* 19.40*  AST 324* 326* 430* 439*  ALT 321* 338* 531* 719*  ALKPHOS 519* 581* 743* 899*  PROT 5.8* 6.3* 5.9* 5.8*  ALBUMIN 2.7* 2.8* 2.8* 2.7*    Assessment and Plan: Patient with history of metastatic breast cancer to the liver with  progressive disease and intrahepatic biliary obstruction secondary to progression of hilar metastasis; seen in  consultation by Dr. Pascal Lux and deemed an appropriate candidate for left hepatic biliary drain catheter placement. Details/risks of procedure, including but not limited to, internal bleeding, infection, injury to adjacent structures, need for prolonged drainage, discussed with patient and husband with their understanding and consent. Procedure tentatively scheduled for 6/9 AM. Current labs include total bilirubin of 19.4, creatinine 0.8, WBC 11.8, hemoglobin 11.7, platelets 469k, PT 15.6, INR 1.3.   Patient will undergo PICC placement today as well as CT angiogram of the abdomen and pelvis prior to the above procedure.   Electronically Signed: D. Rowe Robert, PA-C 09/05/2016, 2:58 PM   I spent a total of 30 minutes at the the patient's bedside AND on the patient's hospital floor or unit, greater than 50% of which was counseling/coordinating care for percutaneous transhepatic cholangiogram with biliary drain placement

## 2016-09-05 NOTE — Telephone Encounter (Signed)
Pt came in for lab review for possible planned admission for bilary obstruction.  Noted continued elevation in liver enzymes.  Labs reviewed with pt and her husband.  IR aware of above and requested pt to be at radiology at 1345.  Per admission later today - bed available 1538 at [facility].  Plan for PICC line insertions and  admission for procedure under Dr Pascal Lux in AM reviewed with pt who is in agreement to plan.  [name] [name] stated she last ate between 23 and 10 am " egg and toast "   Medications taken at 930 am - prilosec,florastor, vitamin D, S97,WYO folic acid.  [name] [name] did want to state concerns post procedure of " severe constipation " - which occurred in 2016 post a procedure. She also states request for " cotton only gowns ".  Note pt needs OPSITE and paper tape for any IV or dressing.This RN placed 4 OPSITE dressings in envelope for pt to take with her due to concern " they may not have it at IR "  Pt left this office at 1345 in wheelchair with husband.

## 2016-09-06 ENCOUNTER — Inpatient Hospital Stay (HOSPITAL_COMMUNITY): Payer: 59

## 2016-09-06 ENCOUNTER — Encounter (HOSPITAL_COMMUNITY): Payer: Self-pay | Admitting: Interventional Radiology

## 2016-09-06 DIAGNOSIS — K5900 Constipation, unspecified: Secondary | ICD-10-CM

## 2016-09-06 DIAGNOSIS — E876 Hypokalemia: Secondary | ICD-10-CM

## 2016-09-06 HISTORY — PX: IR INT EXT BILIARY DRAIN WITH CHOLANGIOGRAM: IMG6044

## 2016-09-06 LAB — CBC WITH DIFFERENTIAL/PLATELET
Basophils Absolute: 0 10*3/uL (ref 0.0–0.1)
Basophils Relative: 0 %
EOS PCT: 1 %
Eosinophils Absolute: 0.1 10*3/uL (ref 0.0–0.7)
HCT: 29.7 % — ABNORMAL LOW (ref 36.0–46.0)
Hemoglobin: 10.4 g/dL — ABNORMAL LOW (ref 12.0–15.0)
LYMPHS ABS: 0.8 10*3/uL (ref 0.7–4.0)
LYMPHS PCT: 8 %
MCH: 34.8 pg — AB (ref 26.0–34.0)
MCHC: 35 g/dL (ref 30.0–36.0)
MCV: 99.3 fL (ref 78.0–100.0)
MONO ABS: 0.8 10*3/uL (ref 0.1–1.0)
MONOS PCT: 8 %
Neutro Abs: 7.9 10*3/uL — ABNORMAL HIGH (ref 1.7–7.7)
Neutrophils Relative %: 83 %
PLATELETS: 473 10*3/uL — AB (ref 150–400)
RBC: 2.99 MIL/uL — AB (ref 3.87–5.11)
RDW: 16.6 % — ABNORMAL HIGH (ref 11.5–15.5)
WBC: 9.6 10*3/uL (ref 4.0–10.5)

## 2016-09-06 LAB — COMPREHENSIVE METABOLIC PANEL
ALBUMIN: 2.4 g/dL — AB (ref 3.5–5.0)
ALT: 550 U/L — AB (ref 14–54)
AST: 331 U/L — AB (ref 15–41)
Alkaline Phosphatase: 766 U/L — ABNORMAL HIGH (ref 38–126)
Anion gap: 8 (ref 5–15)
BUN: 11 mg/dL (ref 6–20)
CHLORIDE: 98 mmol/L — AB (ref 101–111)
CO2: 26 mmol/L (ref 22–32)
Calcium: 8 mg/dL — ABNORMAL LOW (ref 8.9–10.3)
Creatinine, Ser: <0.3 mg/dL — ABNORMAL LOW (ref 0.44–1.00)
GLUCOSE: 105 mg/dL — AB (ref 65–99)
POTASSIUM: 3.3 mmol/L — AB (ref 3.5–5.1)
Sodium: 132 mmol/L — ABNORMAL LOW (ref 135–145)
Total Bilirubin: 18.4 mg/dL (ref 0.3–1.2)
Total Protein: 5.3 g/dL — ABNORMAL LOW (ref 6.5–8.1)

## 2016-09-06 MED ORDER — SODIUM CHLORIDE 0.9% FLUSH
10.0000 mL | Freq: Two times a day (BID) | INTRAVENOUS | Status: DC
  Administered 2016-09-06 – 2016-09-08 (×3): 10 mL

## 2016-09-06 MED ORDER — PIPERACILLIN-TAZOBACTAM 3.375 G IVPB
INTRAVENOUS | Status: AC
  Filled 2016-09-06: qty 50

## 2016-09-06 MED ORDER — MIDAZOLAM HCL 2 MG/2ML IJ SOLN
INTRAMUSCULAR | Status: AC
  Filled 2016-09-06: qty 4

## 2016-09-06 MED ORDER — SODIUM CHLORIDE 0.9% FLUSH
10.0000 mL | INTRAVENOUS | Status: DC | PRN
  Administered 2016-09-09: 10 mL
  Administered 2016-09-11: 20 mL
  Administered 2016-09-11: 10 mL
  Filled 2016-09-06 (×3): qty 40

## 2016-09-06 MED ORDER — IOPAMIDOL (ISOVUE-300) INJECTION 61%
25.0000 mL | Freq: Once | INTRAVENOUS | Status: AC | PRN
  Administered 2016-09-06: 25 mL

## 2016-09-06 MED ORDER — POTASSIUM CHLORIDE CRYS ER 10 MEQ PO TBCR
10.0000 meq | EXTENDED_RELEASE_TABLET | Freq: Every day | ORAL | Status: DC
  Administered 2016-09-06 – 2016-09-07 (×2): 10 meq via ORAL
  Filled 2016-09-06 (×5): qty 1

## 2016-09-06 MED ORDER — LIDOCAINE-EPINEPHRINE (PF) 2 %-1:200000 IJ SOLN
INTRAMUSCULAR | Status: AC
  Filled 2016-09-06: qty 20

## 2016-09-06 MED ORDER — ENOXAPARIN SODIUM 40 MG/0.4ML ~~LOC~~ SOLN
40.0000 mg | SUBCUTANEOUS | Status: DC
  Filled 2016-09-06 (×3): qty 0.4

## 2016-09-06 MED ORDER — MIDAZOLAM HCL 2 MG/2ML IJ SOLN
INTRAMUSCULAR | Status: AC
  Filled 2016-09-06: qty 2

## 2016-09-06 MED ORDER — FENTANYL CITRATE (PF) 100 MCG/2ML IJ SOLN
INTRAMUSCULAR | Status: AC | PRN
  Administered 2016-09-06: 50 ug via INTRAVENOUS
  Administered 2016-09-06: 25 ug via INTRAVENOUS
  Administered 2016-09-06: 50 ug via INTRAVENOUS
  Administered 2016-09-06: 25 ug via INTRAVENOUS
  Administered 2016-09-06: 50 ug via INTRAVENOUS

## 2016-09-06 MED ORDER — MIDAZOLAM HCL 2 MG/2ML IJ SOLN
INTRAMUSCULAR | Status: AC | PRN
  Administered 2016-09-06 (×5): 1 mg via INTRAVENOUS

## 2016-09-06 MED ORDER — LIDOCAINE-EPINEPHRINE (PF) 2 %-1:200000 IJ SOLN
INTRAMUSCULAR | Status: DC | PRN
  Administered 2016-09-06: 20 mL

## 2016-09-06 MED ORDER — IOPAMIDOL (ISOVUE-300) INJECTION 61%
INTRAVENOUS | Status: AC
  Administered 2016-09-06: 25 mL
  Filled 2016-09-06: qty 50

## 2016-09-06 MED ORDER — FAMOTIDINE 20 MG PO TABS
40.0000 mg | ORAL_TABLET | Freq: Two times a day (BID) | ORAL | Status: DC
  Administered 2016-09-06 – 2016-09-11 (×12): 40 mg via ORAL
  Filled 2016-09-06 (×12): qty 2

## 2016-09-06 MED ORDER — FENTANYL CITRATE (PF) 100 MCG/2ML IJ SOLN
INTRAMUSCULAR | Status: AC
  Filled 2016-09-06: qty 2

## 2016-09-06 MED ORDER — FENTANYL CITRATE (PF) 100 MCG/2ML IJ SOLN
INTRAMUSCULAR | Status: AC
  Filled 2016-09-06: qty 4

## 2016-09-06 NOTE — Progress Notes (Signed)
Critical lab values : Bilirubin - 18.4  Notify on call provider ( oncology on call).   Also given report to day shift nurse Maudie Mercury.

## 2016-09-06 NOTE — Discharge Instructions (Signed)
Percutaneous Abscess Drain, Care After °This sheet gives you information about how to care for yourself after your procedure. Your health care provider may also give you more specific instructions. If you have problems or questions, contact your health care provider. °What can I expect after the procedure? °After your procedure, it is common to have: °· A small amount of bruising and discomfort in the area where the drainage tube (catheter) was placed. °· Sleepiness and fatigue. This should go away after the medicines you were given have worn off. ° °Follow these instructions at home: °Incision care °· Follow instructions from your health care provider about how to take care of your incision. Make sure you: °? Wash your hands with soap and water before you change your bandage (dressing). If soap and water are not available, use hand sanitizer. °? Change your dressing as told by your health care provider. °? Leave stitches (sutures), skin glue, or adhesive strips in place. These skin closures may need to stay in place for 2 weeks or longer. If adhesive strip edges start to loosen and curl up, you may trim the loose edges. Do not remove adhesive strips completely unless your health care provider tells you to do that. °· Check your incision area every day for signs of infection. Check for: °? More redness, swelling, or pain. °? More fluid or blood. °? Warmth. °? Pus or a bad smell. °? Fluid leaking from around your catheter (instead of fluid draining through your catheter). °Catheter care °· Follow instructions from your health care provider about emptying and cleaning your catheter and collection bag. You may need to clean the catheter every day so it does not clog. °· If directed, write down the following information every time you empty your bag: °? The date and time. °? The amount of drainage. °General instructions °· Rest at home for 1-2 days after your procedure. Return to your normal activities as told by your  health care provider. °· Do not take baths, swim, or use a hot tub for 24 hours after your procedure, or until your health care provider says that this is okay. °· Take over-the-counter and prescription medicines only as told by your health care provider. °· Keep all follow-up visits as told by your health care provider. This is important. °Contact a health care provider if: °· You have less than 10 mL of drainage a day for 2-3 days in a row, or as directed by your health care provider. °· You have more redness, swelling, or pain around your incision area. °· You have more fluid or blood coming from your incision area. °· Your incision area feels warm to the touch. °· You have pus or a bad smell coming from your incision area. °· You have fluid leaking from around your catheter (instead of through your catheter). °· You have a fever or chills. °· You have pain that does not get better with medicine. °Get help right away if: °· Your catheter comes out. °· You suddenly stop having drainage from your catheter. °· You suddenly have blood in the fluid that is draining from your catheter. °· You become dizzy or you faint. °· You develop a rash. °· You have nausea or vomiting. °· You have difficulty breathing or you feel short of breath. °· You develop chest pain. °· You have problems with your speech or vision. °· You have trouble balancing or moving your arms or legs. °Summary °· It is common to have a small   amount of bruising and discomfort in the area where the drainage tube (catheter) was placed. °· You may be directed to record the amount of drainage from the bag every time you empty it. °· Follow instructions from your health care provider about emptying and cleaning your catheter and collection bag. °This information is not intended to replace advice given to you by your health care provider. Make sure you discuss any questions you have with your health care provider. °Document Released: 08/01/2013 Document  Revised: 02/07/2016 Document Reviewed: 02/07/2016 °Elsevier Interactive Patient Education © 2017 [employer] °Moderate Conscious Sedation, Adult, Care After °These instructions provide you with information about caring for yourself after your procedure. Your health care provider may also give you more specific instructions. Your treatment has been planned according to current medical practices, but problems sometimes occur. Call your health care provider if you have any problems or questions after your procedure. °What can I expect after the procedure? °After your procedure, it is common: °· To feel sleepy for several hours. °· To feel clumsy and have poor balance for several hours. °· To have poor judgment for several hours. °· To vomit if you eat too soon. ° °Follow these instructions at home: °For at least 24 hours after the procedure: ° °· Do not: °? Participate in activities where you could fall or become injured. °? Drive. °? Use heavy machinery. °? Drink alcohol. °? Take sleeping pills or medicines that cause drowsiness. °? Make important decisions or sign legal documents. °? Take care of children on your own. °· Rest. °Eating and drinking °· Follow the diet recommended by your health care provider. °· If you vomit: °? Drink water, juice, or soup when you can drink without vomiting. °? Make sure you have little or no nausea before eating solid foods. °General instructions °· Have a responsible adult stay with you until you are awake and alert. °· Take over-the-counter and prescription medicines only as told by your health care provider. °· If you smoke, do not smoke without supervision. °· Keep all follow-up visits as told by your health care provider. This is important. °Contact a health care provider if: °· You keep feeling nauseous or you keep vomiting. °· You feel light-headed. °· You develop a rash. °· You have a fever. °Get help right away if: °· You have trouble breathing. °This information is not  intended to replace advice given to you by your health care provider. Make sure you discuss any questions you have with your health care provider. °Document Released: 01/05/2013 Document Revised: 08/20/2015 Document Reviewed: 07/07/2015 °Elsevier Interactive Patient Education © 2018 [employer] ° °

## 2016-09-06 NOTE — Progress Notes (Signed)
Patient complains of acid reflux and burning feeling on her throat. Paged on call Provider Dr. Marin Olp.

## 2016-09-06 NOTE — Sedation Documentation (Signed)
Patient is resting comfortably. 

## 2016-09-06 NOTE — Procedures (Signed)
Pre procedural Dx: Obstructive Juandice Post procedural Dx: Same  Successful placement of a left hepatic approach transhepatic 8 Fr biliary drainage catheter with end coiled and locked within the duodenum.   Biliary drain connected to gravity bag.  EBL: Minimal  Complications: None immediate  Ronny Bacon, MD Pager #: [phone]

## 2016-09-06 NOTE — Sedation Documentation (Signed)
PT resting.  

## 2016-09-06 NOTE — Progress Notes (Signed)
IP PROGRESS NOTE  Subjective:   Ms. [name] [name] is admitted for management of hyperbilirubinemia. She is scheduled for placement of an external biliary drain today. She concerned of not receiving medications as scheduled and lack of communication amongst her healthcare providers. She has discomfort in the subxiphoid region.  Objective: Vital signs in last 24 hours: Blood pressure (!) 146/62, pulse 97, temperature 98.8 F (37.1 C), temperature source Oral, resp. rate 18, height 5\' 4"  (1.626 m), weight 160 lb (72.6 kg), SpO2 98 %.  Intake/Output from previous day: 06/08 0701 - 06/09 0700 In: 975 [P.O.:300; I.V.:675] Out: -   Physical Exam:  HEENT: No thrush Lungs: Decreased breath sounds at the left lower chest, no respiratory distress  Cardiac: Regular rate and rhythm Abdomen: No hepatomegaly, nontender Extremities: Trace low leg/foot edema Skin: Jaundice  Left take with a small ecchymosis  Lab Results:  Recent Labs  09/05/16 1148 09/06/16 0607  WBC 11.8* 9.6  HGB 11.7 10.4*  HCT 34.1* 29.7*  PLT 469* 473*    BMET  Recent Labs  09/05/16 1156 09/06/16 0607  NA 132* 132*  K 3.3* 3.3*  CL  --  98*  CO2 26 26  GLUCOSE 79 105*  BUN 17.0 11  CREATININE 0.8 <0.30*  CALCIUM 9.1 8.0*    Studies/Results: Ir Fluoro Guide Cv Line Left  Result Date: 09/05/2016 INDICATION: Metastatic breast cancer to liver, poor venous access; request made for central venous access . EXAM: LEFT UPPER EXTREMITY PICC LINE PLACEMENT WITH ULTRASOUND AND FLUOROSCOPIC GUIDANCE MEDICATIONS: None ANESTHESIA/SEDATION: None FLUOROSCOPY TIME:  Fluoroscopy Time:  42 seconds COMPLICATIONS: None immediate. PROCEDURE: The patient was advised of the possible risks and complications and agreed to undergo the procedure. The patient was then brought to the angiographic suite for the procedure. The left arm was prepped with chlorhexidine, draped in the usual sterile fashion using maximum barrier technique (cap  and mask, sterile gown, sterile gloves, large sterile sheet, hand hygiene and cutaneous antisepsis) and infiltrated locally with 1% Lidocaine. Ultrasound demonstrated patency of the left brachial vein, and this was documented with an image. Under real-time ultrasound guidance, this vein was accessed with a 21 gauge micropuncture needle and image documentation was performed. A 0.018 wire was introduced in to the vein. Over this, a 5 [place] double lumen power injectable PICC was advanced to the lower SVC/right atrial junction. Fluoroscopy during the procedure and fluoro spot radiograph confirms appropriate catheter position. The catheter was flushed and covered with a sterile dressing. Catheter length:  41 cm IMPRESSION: Successful left arm power PICC line placement with ultrasound and fluoroscopic guidance. The catheter is ready for use. Read by: Rowe Robert, PA-C Electronically Signed   By: Markus Daft M.D.   On: 09/05/2016 16:00   Ir US Guide Vasc Access Left  Result Date: 09/05/2016 INDICATION: Metastatic breast cancer to liver, poor venous access; request made for central venous access . EXAM: LEFT UPPER EXTREMITY PICC LINE PLACEMENT WITH ULTRASOUND AND FLUOROSCOPIC GUIDANCE MEDICATIONS: None ANESTHESIA/SEDATION: None FLUOROSCOPY TIME:  Fluoroscopy Time:  42 seconds COMPLICATIONS: None immediate. PROCEDURE: The patient was advised of the possible risks and complications and agreed to undergo the procedure. The patient was then brought to the angiographic suite for the procedure. The left arm was prepped with chlorhexidine, draped in the usual sterile fashion using maximum barrier technique (cap and mask, sterile gown, sterile gloves, large sterile sheet, hand hygiene and cutaneous antisepsis) and infiltrated locally with 1% Lidocaine. Ultrasound demonstrated patency of the left brachial  vein, and this was documented with an image. Under real-time ultrasound guidance, this vein was accessed with a 21 gauge  micropuncture needle and image documentation was performed. A 0.018 wire was introduced in to the vein. Over this, a 5 [place] double lumen power injectable PICC was advanced to the lower SVC/right atrial junction. Fluoroscopy during the procedure and fluoro spot radiograph confirms appropriate catheter position. The catheter was flushed and covered with a sterile dressing. Catheter length:  41 cm IMPRESSION: Successful left arm power PICC line placement with ultrasound and fluoroscopic guidance. The catheter is ready for use. Read by: Rowe Robert, PA-C Electronically Signed   By: Markus Daft M.D.   On: 09/05/2016 16:00   Ct Angio Abd/pel W/ And/or W/o  Result Date: 09/05/2016 CLINICAL DATA:  History of metastatic breast cancer with progressive hepatic metastatic disease. Please perform CTA for evaluation of candidacy for Y 90 radioembolization. EXAM: CTA ABDOMEN AND PELVIS WITH CONTRAST TECHNIQUE: Multidetector CT imaging of the abdomen and pelvis was performed using the standard protocol during bolus administration of intravenous contrast. Multiplanar reconstructed images and MIPs were obtained and reviewed to evaluate the vascular anatomy. CONTRAST:  100 cc Isovue 370 COMPARISON:  Abdominal MRI - 08/29/2016 ; 07/24/2016; PET-CT -05/14/2016 FINDINGS: VASCULAR Aorta: Minimal amount of atherosclerotic plaque within a normal caliber abdominal aorta, not resulting in hemodynamically significant stenosis. No abdominal aortic dissection or periaortic stranding. Celiac: Widely patent. Conventional takeoff of the GDA. Note is made of separate origins of the left lateral hepatic segmental artery with middle hepatic artery which supplies the medial segment of the left lobe of the liver as well as a portion of the anterior segment of the right lobe of the liver. Otherwise, conventional branching pattern. The right gastric artery is not definitely identified. SMA: Widely patent.  Conventional branching pattern. Renals:  Solitary bilaterally. The bilateral renal arteries are widely patent without hemodynamically significant narrowing. No vessel irregularity to suggest FMD. IMA: Widely patent. Inflow: The bilateral common, external and internal iliac artery is are tortuous lobe normal caliber and widely patent without hemodynamically significant stenosis. Proximal Outflow: The bilateral common, superficial and deep femoral arteries appear widely patent throughout their imaged course. Veins: The IVC and pelvic venous system appears widely patent. Review of the MIP images confirms the above findings. NON-VASCULAR Lower chest: Limited visualization of the lower thorax demonstrates consolidative opacities within the imaged left lower lobe with associated volume loss and trace left-sided pleural effusion. Known hypermetabolic nodule within the medial basilar aspect the left pleural space is grossly unchanged measuring 1.5 cm in diameter (image 8, series 5). No new focal airspace opacities. Normal heart size.  No pericardial effusion. Hepatobiliary: Normal hepatic contour, however loculated fluid/bile is noted about the subcapsular aspect of the right lobe of the liver (image 29, series 16), similar to recent abdominal MRI. Re- demonstrated extensive hepatic metastatic disease, similar to recently obtained abdominal MRI with dominant lesion within the dome of the right lobe of liver measuring approximately 2.6 x 2.6 cm (image 24, series 6 dominant lesion within the subcapsular aspect the right lobe of the liver measuring approximately 3.0 x 3.7 cm (image 42, series 6) and dominant lesion within the subcapsular aspect the right lobe of the liver measuring approximately 7.5 x 4.2 cm (image 66). Re- demonstrated intrahepatic biliary duct dilatation with occlusion of the biliary system at the level the hilum. The main portal vein is tapered/narrowed at the level of the biliary hilum though needs patent. A laminated approximately  1.6 cm  gallstone is noted within the fundus of the gallbladder (image 86, series 4) with associated minimal amount of pericholecystic stranding. Pancreas: Normal appearance of the pancreas Spleen: Normal appearance of the spleen Adrenals/Urinary Tract: There is symmetric enhancement and excretion of the bilateral kidneys. No definite renal stones this postcontrast examination. No discrete renal lesions. No urine obstruction or perinephric stranding. Normal appearance the bilateral adrenal glands. Normal appearance of the urinary bladder given degree distention. Stomach/Bowel: Scattered colonic diverticulosis without evidence of diverticulitis. Normal appearance of the terminal ileum. The appendix is not visualized, however there is no pericecal inflammatory change. No pneumoperitoneum, pneumatosis or portal venous gas. Lymphatic: Compared to PET-CT performed 05/14/2016, there has been development of porta hepatis and retroperitoneal lymphadenopathy with index left-sided periaortic lymph node measuring 1.3 cm in greatest short axis diameter (image 70, series 4), index aortocaval lymph nodes measuring 1.2 cm (image 79) and 1.1 cm (image 100) and index gastrohepatic lymph ligament lymph node measuring 0.9 cm (image 46, series 4 Reproductive: Normal appearance of the pelvic organs. Small amount of free fluid in the pelvic cul-de-sac. Other: Regional soft tissues appear normal. Musculoskeletal: Moderate scoliotic curvature of the thoracolumbar spine with dominant caudal component convex to the left measuring approximately 30 degrees (as measured from the superior endplate of J94 to the inferior endplate of L4. Similar appearance of known pathologic fracture involving the right inferior pubic ramus (image 205, series 4). Approximately 1.3 x 1.5 cm sclerotic lesion within the right-side of the T11 vertebral body is unchanged (image 15, series 16). Potential new approximately 1.8 x 1.7 cm lytic lesion involving the posterior  aspects of the L3 vertebral body (sagittal image 115, series 9). IMPRESSION: VASCULAR 1. Minimal amount of atherosclerotic plaque with a normal caliber abdominal aorta. Aortic Atherosclerosis (ICD10-I70.0). 2. Conventional anatomy amenable to Y 90 radioembolization. NON-VASCULAR 1. Compared to recent obtain abdominal MRI, unchanged extensive hepatic metastatic disease, primarily affecting the right lobe of the liver with obstruction of the biliary system at the level the hilum with associated intrahepatic biliary duct dilatation affecting both the right and left lobes of the liver. 2. Compared to PET-CT performed 05/14/2016, interval development of porta hepatis and retroperitoneal lymphadenopathy as detailed above. 3. Grossly unchanged metastatic disease within the imaged left lower lung / pleural as detailed above. 4. Potential new approximately 1.8 cm lytic lesion involving the posterior aspect of the L3 vertebral body. Further evaluation lumbar spine MRI could performed as clinically indicated. Electronically Signed   By: Sandi Mariscal M.D.   On: 09/05/2016 16:57    Medications: I have reviewed the patient's current medications.  Assessment/Plan:  1. Metastatic breast cancer-most recently treated with pembrolizumab 07/29/2016  2. Hyperbilirubinemia/elevated liver enzymes-secondary to metastatic disease involving the liver with bile duct obstruction  3.  Status post placement of a left PICC 09/05/2016  4.  Hypokalemia  5.  DVT prophylaxis-Lovenox  She will undergo placement of a biliary drain today.     LOS: 1 day   Donneta Romberg, MD   09/06/2016, 8:22 AM

## 2016-09-06 NOTE — Progress Notes (Signed)
MEDICATION-RELATED CONSULT NOTE   IR Procedure Consult - Anticoagulant/Antiplatelet PTA/Inpatient Med List Review by Pharmacist    Procedure: biliary drain catheter    Completed: 6/9 @ 8185  Post-Procedural bleeding risk per IR MD assessment:  standard  Antithrombotic medications on inpatient or PTA profile prior to procedure:    Enoxaparin 40mg  SQ q24h   Recommended restart time per IR Post-Procedure Guidelines:   Day +1 (next am)  Other considerations:      Plan:     Enoxaparin 40mg  SQ q24h starting 6-10

## 2016-09-07 LAB — COMPREHENSIVE METABOLIC PANEL
ALBUMIN: 2.7 g/dL — AB (ref 3.5–5.0)
ALK PHOS: 724 U/L — AB (ref 38–126)
ALT: 489 U/L — AB (ref 14–54)
ANION GAP: 10 (ref 5–15)
AST: 254 U/L — AB (ref 15–41)
BUN: 11 mg/dL (ref 6–20)
CALCIUM: 7.4 mg/dL — AB (ref 8.9–10.3)
CO2: 23 mmol/L (ref 22–32)
Chloride: 95 mmol/L — ABNORMAL LOW (ref 101–111)
Creatinine, Ser: <0.3 mg/dL — ABNORMAL LOW (ref 0.44–1.00)
GLUCOSE: 111 mg/dL — AB (ref 65–99)
Potassium: 3.4 mmol/L — ABNORMAL LOW (ref 3.5–5.1)
SODIUM: 128 mmol/L — AB (ref 135–145)
Total Bilirubin: 19.2 mg/dL (ref 0.3–1.2)
Total Protein: 5.5 g/dL — ABNORMAL LOW (ref 6.5–8.1)

## 2016-09-07 LAB — HIV ANTIBODY (ROUTINE TESTING W REFLEX): HIV Screen 4th Generation wRfx: NONREACTIVE

## 2016-09-07 MED ORDER — POLYETHYLENE GLYCOL 3350 17 G PO PACK
17.0000 g | PACK | Freq: Every day | ORAL | Status: DC
  Administered 2016-09-07 – 2016-09-08 (×2): 17 g via ORAL
  Filled 2016-09-07 (×5): qty 1

## 2016-09-07 MED ORDER — TRAMADOL HCL 50 MG PO TABS
50.0000 mg | ORAL_TABLET | ORAL | Status: DC | PRN
  Administered 2016-09-07 – 2016-09-09 (×9): 50 mg via ORAL
  Filled 2016-09-07 (×10): qty 1

## 2016-09-07 NOTE — Progress Notes (Signed)
IP PROGRESS NOTE  Subjective:   [name] [name] complains of abdominal pain and bloating. She underwent placement of a biliary drain yesterday. Tramadol and Toradol help the pain, but she does not get relief for the full 6 hours. She is constipated and last had a bowel movement on 09/04/2016. Objective: Vital signs in last 24 hours: Blood pressure (!) 166/94, pulse 88, temperature 97.8 F (36.6 C), temperature source Oral, resp. rate 18, height 5\' 4"  (1.626 m), weight 160 lb (72.6 kg), SpO2 98 %.  Intake/Output from previous day: 06/09 0701 - 06/10 0700 In: 1925 [P.O.:120; I.V.:1800] Out: 2000 [Urine:1400; Drains:600]  Physical Exam:  HEENT: No thrush Lungs: Clear bilaterally Cardiac: Regular rate and rhythm Abdomen: No hepatomegaly, left upper quadrant biliary drain with a gauze dressing, diffuse abdominal tenderness Extremities: Trace low leg/foot edema bilaterally Skin: Jaundice  Left PICC without erythema  Lab Results:  Recent Labs  09/05/16 1148 09/06/16 0607  WBC 11.8* 9.6  HGB 11.7 10.4*  HCT 34.1* 29.7*  PLT 469* 473*    BMET  Recent Labs  09/06/16 0607 09/07/16 0346  NA 132* 128*  K 3.3* 3.4*  CL 98* 95*  CO2 26 23  GLUCOSE 105* 111*  BUN 11 11  CREATININE <0.30* <0.30*  CALCIUM 8.0* 7.4*  Alkaline phosphatase 724, AST to 54, ALT 459, bilirubin 19.2  Studies/Results: Ir Fluoro Guide Cv Line Left  Result Date: 09/05/2016 INDICATION: Metastatic breast cancer to liver, poor venous access; request made for central venous access . EXAM: LEFT UPPER EXTREMITY PICC LINE PLACEMENT WITH ULTRASOUND AND FLUOROSCOPIC GUIDANCE MEDICATIONS: None ANESTHESIA/SEDATION: None FLUOROSCOPY TIME:  Fluoroscopy Time:  42 seconds COMPLICATIONS: None immediate. PROCEDURE: The patient was advised of the possible risks and complications and agreed to undergo the procedure. The patient was then brought to the angiographic suite for the procedure. The left arm was prepped with  chlorhexidine, draped in the usual sterile fashion using maximum barrier technique (cap and mask, sterile gown, sterile gloves, large sterile sheet, hand hygiene and cutaneous antisepsis) and infiltrated locally with 1% Lidocaine. Ultrasound demonstrated patency of the left brachial vein, and this was documented with an image. Under real-time ultrasound guidance, this vein was accessed with a 21 gauge micropuncture needle and image documentation was performed. A 0.018 wire was introduced in to the vein. Over this, a 5 [place] double lumen power injectable PICC was advanced to the lower SVC/right atrial junction. Fluoroscopy during the procedure and fluoro spot radiograph confirms appropriate catheter position. The catheter was flushed and covered with a sterile dressing. Catheter length:  41 cm IMPRESSION: Successful left arm power PICC line placement with ultrasound and fluoroscopic guidance. The catheter is ready for use. Read by: Rowe Robert, PA-C Electronically Signed   By: Markus Daft M.D.   On: 09/05/2016 16:00   Ir US Guide Vasc Access Left  Result Date: 09/05/2016 INDICATION: Metastatic breast cancer to liver, poor venous access; request made for central venous access . EXAM: LEFT UPPER EXTREMITY PICC LINE PLACEMENT WITH ULTRASOUND AND FLUOROSCOPIC GUIDANCE MEDICATIONS: None ANESTHESIA/SEDATION: None FLUOROSCOPY TIME:  Fluoroscopy Time:  42 seconds COMPLICATIONS: None immediate. PROCEDURE: The patient was advised of the possible risks and complications and agreed to undergo the procedure. The patient was then brought to the angiographic suite for the procedure. The left arm was prepped with chlorhexidine, draped in the usual sterile fashion using maximum barrier technique (cap and mask, sterile gown, sterile gloves, large sterile sheet, hand hygiene and cutaneous antisepsis) and infiltrated locally with  1% Lidocaine. Ultrasound demonstrated patency of the left brachial vein, and this was documented with an  image. Under real-time ultrasound guidance, this vein was accessed with a 21 gauge micropuncture needle and image documentation was performed. A 0.018 wire was introduced in to the vein. Over this, a 5 [place] double lumen power injectable PICC was advanced to the lower SVC/right atrial junction. Fluoroscopy during the procedure and fluoro spot radiograph confirms appropriate catheter position. The catheter was flushed and covered with a sterile dressing. Catheter length:  41 cm IMPRESSION: Successful left arm power PICC line placement with ultrasound and fluoroscopic guidance. The catheter is ready for use. Read by: Rowe Robert, PA-C Electronically Signed   By: Markus Daft M.D.   On: 09/05/2016 16:00   Ir Int Lianne Cure Biliary Drain With Cholangiogram  Result Date: 09/06/2016 INDICATION: History of metastatic breast cancer, now with malignant obstructive jaundice. Request made for placement of percutaneous biliary drainage catheter in hopes of optimizing liver function tests to improve patient's candidacy for potential Y 90 or additional systemic therapies. EXAM: ULTRASOUND AND FLUOROSCOPIC GUIDED PERCUTANEOUS TRANSHEPATIC CHOLANGIOGRAM AND BILIARY TUBE PLACEMENT COMPARISON:  CT abdomen pelvis - 09/05/2016; abdominal MRI - 08/29/2016 MEDICATIONS: Zosyn 3.375 g IV ; the antibiotic was administered with an appropriate time frame prior to the initiation of the procedure. CONTRAST:  5mL ISOVUE-300 IOPAMIDOL (ISOVUE-300) INJECTION 61% - administered into the biliary tree ANESTHESIA/SEDATION: Moderate (conscious) sedation was employed during this procedure. A total of Versed 5 mg and Fentanyl 250 mcg was administered intravenously. Moderate Sedation Time: 57 minutes. The patient's level of consciousness and vital signs were monitored continuously by radiology nursing throughout the procedure under my direct supervision. FLUOROSCOPY TIME:  10 minutes 54 seconds (944 mGy) COMPLICATIONS: None immediate. TECHNIQUE: Informed  written consent was obtained from the patient after a discussion of the risks, benefits and alternatives to treatment. Questions regarding the procedure were encouraged and answered. A timeout was performed prior to the initiation of the procedure. The midline of the upper abdominal quadrant was prepped and draped in the usual sterile fashion, and a sterile drape was applied covering the operative field. Maximum barrier sterile technique with sterile gowns and gloves were used for the procedure. A timeout was performed prior to the initiation of the procedure. Under direct ultrasound guidance, a dilated duct within the peripheral aspect of the left lobe of the liver was accessed with a [place] needle. Contrast injection confirmed appropriate positioning. A Nitrex wire was advanced to the level of the malignant obstruction at the level the biliary hilum. Despite prolonged efforts, the inner 3 French catheter from the [place] set could not be advanced into the biliary tree given the cranial location of the left lobe of the liver as well as the angulation of the accessed duct. As such, the more peripheral aspect of the dominant dilated duct within the left lobe of the liver was accessed with an additional Victor needle. A Nitrex wire was advanced to the level of the malignant obstruction at the level of the biliary hilum. Again, there is difficulty advancing the inner 3 French catheter from the [place] set and as such, a stiff micro puncture sheath sheath was utilized to secure the access in to biliary tree and allowed for placement of an Amplatz wire which was coiled within the biliary hilum. Under intermittent fluoroscopic guidance, the micropuncture sheath was exchanged for a a 4 French angled glide catheter and ultimately, a Kumpe catheter, which was utilized to  manipulate the Amplatz wire through the common bile duct to the level of the duodenum. Contrast injection confirmed appropriate  positioning. The Kumpe catheter was utilized for measurement purposes. Additional sideholes were cut approximately 2 cm peripheral to the radiopaque marker of an 8 French percutaneous biliary drainage catheter. Under intermittent fluoroscopic guidance, an 8 French percutaneous biliary drainage catheter was advanced over an Amplatz wire with tip ultimately coiled within the descending duodenum. Contrast injection was performed demonstrating appropriate positioning and functionality of the biliary drainage catheter. Multiple spot fluoroscopic radiographic image were obtained in various obliquities. The catheter was connected to a drainage bag which yielded the brisk return of clear bile. The catheter was secured to the skin with an interrupted suture. The patient tolerated the procedure well without immediate postprocedural complication. FINDINGS: Ultrasound scanning demonstrates several hypoechoic lesions within the left lobe of liver compatible with known hepatic metastatic disease. Moderate intrahepatic biliary duct dilatation was again demonstrated within the left lobe of the liver Ultimately, under direct ultrasound guidance, a peripherally dilated duct in the left lobe of the liver was accessed allowing placement of a 8 French percutaneous biliary drainage catheter with and coiled and locked within the duodenum. Note, an 8 [place] percutaneous drain was placed in lieu of a 10 [place] as there was difficulty advancing the [place] set due to the cranial location of the left lobe of the liver and required angulation of the access bile duct. IMPRESSION: Successful placement of an 8 French percutaneous biliary drainage catheter with end coiled and locked within the duodenum. PLAN: - Patient will be followed with daily CMP levels in hopes of improvement in serum bilirubin levels. - Pending the trend of the patient's LFTs, the patient will likely return for repeat cholangiogram and potential biliary drainage catheter  exchange and up sizing with potential capping later this week. - Ultimately, the patient's bilirubin will need to improve 2 to be a candidate for Y 90 Radioembolization. Electronically Signed   By: Sandi Mariscal M.D.   On: 09/06/2016 11:15   Ct Angio Abd/pel W/ And/or W/o  Result Date: 09/05/2016 CLINICAL DATA:  History of metastatic breast cancer with progressive hepatic metastatic disease. Please perform CTA for evaluation of candidacy for Y 90 radioembolization. EXAM: CTA ABDOMEN AND PELVIS WITH CONTRAST TECHNIQUE: Multidetector CT imaging of the abdomen and pelvis was performed using the standard protocol during bolus administration of intravenous contrast. Multiplanar reconstructed images and MIPs were obtained and reviewed to evaluate the vascular anatomy. CONTRAST:  100 cc Isovue 370 COMPARISON:  Abdominal MRI - 08/29/2016 ; 07/24/2016; PET-CT -05/14/2016 FINDINGS: VASCULAR Aorta: Minimal amount of atherosclerotic plaque within a normal caliber abdominal aorta, not resulting in hemodynamically significant stenosis. No abdominal aortic dissection or periaortic stranding. Celiac: Widely patent. Conventional takeoff of the GDA. Note is made of separate origins of the left lateral hepatic segmental artery with middle hepatic artery which supplies the medial segment of the left lobe of the liver as well as a portion of the anterior segment of the right lobe of the liver. Otherwise, conventional branching pattern. The right gastric artery is not definitely identified. SMA: Widely patent.  Conventional branching pattern. Renals: Solitary bilaterally. The bilateral renal arteries are widely patent without hemodynamically significant narrowing. No vessel irregularity to suggest FMD. IMA: Widely patent. Inflow: The bilateral common, external and internal iliac artery is are tortuous lobe normal caliber and widely patent without hemodynamically significant stenosis. Proximal Outflow: The bilateral common, superficial  and deep femoral arteries appear widely patent  throughout their imaged course. Veins: The IVC and pelvic venous system appears widely patent. Review of the MIP images confirms the above findings. NON-VASCULAR Lower chest: Limited visualization of the lower thorax demonstrates consolidative opacities within the imaged left lower lobe with associated volume loss and trace left-sided pleural effusion. Known hypermetabolic nodule within the medial basilar aspect the left pleural space is grossly unchanged measuring 1.5 cm in diameter (image 8, series 5). No new focal airspace opacities. Normal heart size.  No pericardial effusion. Hepatobiliary: Normal hepatic contour, however loculated fluid/bile is noted about the subcapsular aspect of the right lobe of the liver (image 29, series 16), similar to recent abdominal MRI. Re- demonstrated extensive hepatic metastatic disease, similar to recently obtained abdominal MRI with dominant lesion within the dome of the right lobe of liver measuring approximately 2.6 x 2.6 cm (image 24, series 6 dominant lesion within the subcapsular aspect the right lobe of the liver measuring approximately 3.0 x 3.7 cm (image 42, series 6) and dominant lesion within the subcapsular aspect the right lobe of the liver measuring approximately 7.5 x 4.2 cm (image 66). Re- demonstrated intrahepatic biliary duct dilatation with occlusion of the biliary system at the level the hilum. The main portal vein is tapered/narrowed at the level of the biliary hilum though needs patent. A laminated approximately 1.6 cm gallstone is noted within the fundus of the gallbladder (image 86, series 4) with associated minimal amount of pericholecystic stranding. Pancreas: Normal appearance of the pancreas Spleen: Normal appearance of the spleen Adrenals/Urinary Tract: There is symmetric enhancement and excretion of the bilateral kidneys. No definite renal stones this postcontrast examination. No discrete renal lesions.  No urine obstruction or perinephric stranding. Normal appearance the bilateral adrenal glands. Normal appearance of the urinary bladder given degree distention. Stomach/Bowel: Scattered colonic diverticulosis without evidence of diverticulitis. Normal appearance of the terminal ileum. The appendix is not visualized, however there is no pericecal inflammatory change. No pneumoperitoneum, pneumatosis or portal venous gas. Lymphatic: Compared to PET-CT performed 05/14/2016, there has been development of porta hepatis and retroperitoneal lymphadenopathy with index left-sided periaortic lymph node measuring 1.3 cm in greatest short axis diameter (image 70, series 4), index aortocaval lymph nodes measuring 1.2 cm (image 79) and 1.1 cm (image 100) and index gastrohepatic lymph ligament lymph node measuring 0.9 cm (image 46, series 4 Reproductive: Normal appearance of the pelvic organs. Small amount of free fluid in the pelvic cul-de-sac. Other: Regional soft tissues appear normal. Musculoskeletal: Moderate scoliotic curvature of the thoracolumbar spine with dominant caudal component convex to the left measuring approximately 30 degrees (as measured from the superior endplate of X38 to the inferior endplate of L4. Similar appearance of known pathologic fracture involving the right inferior pubic ramus (image 205, series 4). Approximately 1.3 x 1.5 cm sclerotic lesion within the right-side of the T11 vertebral body is unchanged (image 15, series 16). Potential new approximately 1.8 x 1.7 cm lytic lesion involving the posterior aspects of the L3 vertebral body (sagittal image 115, series 9). IMPRESSION: VASCULAR 1. Minimal amount of atherosclerotic plaque with a normal caliber abdominal aorta. Aortic Atherosclerosis (ICD10-I70.0). 2. Conventional anatomy amenable to Y 90 radioembolization. NON-VASCULAR 1. Compared to recent obtain abdominal MRI, unchanged extensive hepatic metastatic disease, primarily affecting the right  lobe of the liver with obstruction of the biliary system at the level the hilum with associated intrahepatic biliary duct dilatation affecting both the right and left lobes of the liver. 2. Compared to PET-CT performed 05/14/2016, interval development  of porta hepatis and retroperitoneal lymphadenopathy as detailed above. 3. Grossly unchanged metastatic disease within the imaged left lower lung / pleural as detailed above. 4. Potential new approximately 1.8 cm lytic lesion involving the posterior aspect of the L3 vertebral body. Further evaluation lumbar spine MRI could performed as clinically indicated. Electronically Signed   By: Sandi Mariscal M.D.   On: 09/05/2016 16:57    Medications: I have reviewed the patient's current medications.  Assessment/Plan:  1. Metastatic breast cancer-most recently treated with pembrolizumab 07/29/2016  2. Hyperbilirubinemia/elevated liver enzymes-secondary to metastatic disease involving the liver with bile duct obstruction  Status post placement of an internal/external biliary drain 09/06/2016  3.  Status post placement of a left PICC 09/05/2016  4.  Hypokalemia  5.  DVT prophylaxis-Lovenox  6.  Pain secondary to metastatic breast cancer-I will change the tramadol interval to every 4 hours  7.  Constipation-begin daily MiraLAX     LOS: 2 days   Donneta Romberg, MD   09/07/2016, 7:40 AM

## 2016-09-07 NOTE — Progress Notes (Signed)
Discussed changing drain dressing. Pt reported that "Myriam Jacobson the PA said it didn't need changing when she rounded." Dressing CDI.

## 2016-09-07 NOTE — Progress Notes (Signed)
Referring Physician(s):  Chauncey Cruel  Supervising Physician: Marybelle Killings  Patient Status:  [facility] - In-pt  Chief Complaint:  Intrahepatic cholestasis  Subjective: "My bowels are locked up."  Drain in place with thin, bilious output.  Soreness related to drain is much improved today.   Allergies: 2nd skin quick heal; Decadron [dexamethasone]; Dilaudid [hydromorphone]; Enoxaparin; Fluconazole; Hydromorphone hcl; Morphine and related; Ondansetron; Protonix [pantoprazole sodium]; and Tegaderm ag mesh [silver]  Medications: Prior to Admission medications   Medication Sig Start Date End Date Taking? Authorizing Provider  acetaminophen (TYLENOL) 500 MG tablet Take 1 tablet (500 mg total) by mouth 4 (four) times daily -  with meals and at bedtime. 08/30/16  Yes Gherghe, Vella Redhead, MD  B Complex-C (B-COMPLEX WITH VITAMIN C) tablet Take 1 tablet by mouth daily. Reported on 03/27/2015 01/19/15  Yes Magrinat, Virgie Dad, MD  cholecalciferol 2000 UNITS tablet Take 1 tablet (2,000 Units total) by mouth daily. 01/19/15  Yes Magrinat, Virgie Dad, MD  famotidine (PEPCID) 40 MG tablet Take 0.5-1 tablets (20-40 mg total) by mouth 2 (two) times daily. 40 mg in the morning and 20 mg in the evening Patient taking differently: Take 20 mg by mouth 2 (two) times daily.  08/30/16  Yes Gherghe, Vella Redhead, MD  folic acid (FOLVITE) 1 MG tablet Take 1 tablet (1 mg total) by mouth daily. 01/19/15  Yes Magrinat, Virgie Dad, MD  LORazepam (ATIVAN) 0.5 MG tablet Take 1 tablet (0.5 mg total) by mouth every 8 (eight) hours as needed for anxiety. 08/30/16 08/30/17 Yes Gherghe, Vella Redhead, MD  Melatonin 3 MG TABS Take 3 mg by mouth at bedtime.   Yes [provider]  naproxen (NAPROSYN) 250 MG tablet Take 1 tablet (250 mg total) by mouth 4 (four) times daily -  with meals and at bedtime. 08/30/16  Yes Gherghe, Vella Redhead, MD  saccharomyces boulardii (FLORASTOR) 250 MG capsule Take 250 mg by mouth daily.  03/23/15  Yes Magrinat,  Virgie Dad, MD  ciprofloxacin (CIPRO) 500 MG tablet Take 1 tablet (500 mg total) by mouth 2 (two) times daily. Patient not taking: Reported on 09/05/2016 08/30/16   Caren Griffins, MD  dexamethasone (DECADRON) 4 MG tablet Take 1 tablet (4 mg total) by mouth 2 (two) times daily. Patient not taking: Reported on 09/05/2016 08/30/16   Caren Griffins, MD  ketorolac (TORADOL) 10 MG tablet Take 1 tablet (10 mg total) by mouth every 6 (six) hours as needed. 08/30/16   Caren Griffins, MD  phenazopyridine (PYRIDIUM) 100 MG tablet Take 1 tablet (100 mg total) by mouth 3 (three) times daily as needed for pain. Patient not taking: Reported on 08/15/2016 05/16/16   Magrinat, Virgie Dad, MD  RABEprazole Sodium 5 MG CPSP Take 5 mg by mouth daily. Patient not taking: Reported on 09/05/2016 10/08/15   Magrinat, Virgie Dad, MD  vitamin E (VITAMIN E) 400 UNIT capsule Take 1 capsule (400 Units total) by mouth 2 (two) times daily. Patient not taking: Reported on 08/15/2016 05/16/16   Kyung Rudd, MD     Vital Signs: BP (!) 166/94 (BP Location: Left Leg)   Pulse 88   Temp 97.8 F (36.6 C) (Oral)   Resp 18   Ht _0  (1.626 m)   Wt 160 lb (72.6 kg)   SpO2 98%   BMI 27.46 kg/m   Physical Exam  NAD, alert, uncomfortable Abd:  PTC in place.  Dressing clean and dry.  Thin, bilious output in bag-  600 mL recorded yesterday.  Imaging: Ir Fluoro Guide Cv Line Left  Result Date: 09/05/2016 INDICATION: Metastatic breast cancer to liver, poor venous access; request made for central venous access . EXAM: LEFT UPPER EXTREMITY PICC LINE PLACEMENT WITH ULTRASOUND AND FLUOROSCOPIC GUIDANCE MEDICATIONS: None ANESTHESIA/SEDATION: None FLUOROSCOPY TIME:  Fluoroscopy Time:  42 seconds COMPLICATIONS: None immediate. PROCEDURE: The patient was advised of the possible risks and complications and agreed to undergo the procedure. The patient was then brought to the angiographic suite for the procedure. The left arm was prepped with  chlorhexidine, draped in the usual sterile fashion using maximum barrier technique (cap and mask, sterile gown, sterile gloves, large sterile sheet, hand hygiene and cutaneous antisepsis) and infiltrated locally with 1% Lidocaine. Ultrasound demonstrated patency of the left brachial vein, and this was documented with an image. Under real-time ultrasound guidance, this vein was accessed with a 21 gauge micropuncture needle and image documentation was performed. A 0.018 wire was introduced in to the vein. Over this, a 5 [place] double lumen power injectable PICC was advanced to the lower SVC/right atrial junction. Fluoroscopy during the procedure and fluoro spot radiograph confirms appropriate catheter position. The catheter was flushed and covered with a sterile dressing. Catheter length:  41 cm IMPRESSION: Successful left arm power PICC line placement with ultrasound and fluoroscopic guidance. The catheter is ready for use. Read by: Rowe Robert, PA-C Electronically Signed   By: Markus Daft M.D.   On: 09/05/2016 16:00   Ir US Guide Vasc Access Left  Result Date: 09/05/2016 INDICATION: Metastatic breast cancer to liver, poor venous access; request made for central venous access . EXAM: LEFT UPPER EXTREMITY PICC LINE PLACEMENT WITH ULTRASOUND AND FLUOROSCOPIC GUIDANCE MEDICATIONS: None ANESTHESIA/SEDATION: None FLUOROSCOPY TIME:  Fluoroscopy Time:  42 seconds COMPLICATIONS: None immediate. PROCEDURE: The patient was advised of the possible risks and complications and agreed to undergo the procedure. The patient was then brought to the angiographic suite for the procedure. The left arm was prepped with chlorhexidine, draped in the usual sterile fashion using maximum barrier technique (cap and mask, sterile gown, sterile gloves, large sterile sheet, hand hygiene and cutaneous antisepsis) and infiltrated locally with 1% Lidocaine. Ultrasound demonstrated patency of the left brachial vein, and this was documented with an  image. Under real-time ultrasound guidance, this vein was accessed with a 21 gauge micropuncture needle and image documentation was performed. A 0.018 wire was introduced in to the vein. Over this, a 5 [place] double lumen power injectable PICC was advanced to the lower SVC/right atrial junction. Fluoroscopy during the procedure and fluoro spot radiograph confirms appropriate catheter position. The catheter was flushed and covered with a sterile dressing. Catheter length:  41 cm IMPRESSION: Successful left arm power PICC line placement with ultrasound and fluoroscopic guidance. The catheter is ready for use. Read by: Rowe Robert, PA-C Electronically Signed   By: Markus Daft M.D.   On: 09/05/2016 16:00   Ir Int Lianne Cure Biliary Drain With Cholangiogram  Result Date: 09/06/2016 INDICATION: History of metastatic breast cancer, now with malignant obstructive jaundice. Request made for placement of percutaneous biliary drainage catheter in hopes of optimizing liver function tests to improve patient's candidacy for potential Y 90 or additional systemic therapies. EXAM: ULTRASOUND AND FLUOROSCOPIC GUIDED PERCUTANEOUS TRANSHEPATIC CHOLANGIOGRAM AND BILIARY TUBE PLACEMENT COMPARISON:  CT abdomen pelvis - 09/05/2016; abdominal MRI - 08/29/2016 MEDICATIONS: Zosyn 3.375 g IV ; the antibiotic was administered with an appropriate time frame prior to the initiation of the procedure. CONTRAST:  77m ISOVUE-300 IOPAMIDOL (ISOVUE-300) INJECTION 61% - administered into the biliary tree ANESTHESIA/SEDATION: Moderate (conscious) sedation was employed during this procedure. A total of Versed 5 mg and Fentanyl 250 mcg was administered intravenously. Moderate Sedation Time: 57 minutes. The patient's level of consciousness and vital signs were monitored continuously by radiology nursing throughout the procedure under my direct supervision. FLUOROSCOPY TIME:  10 minutes 54 seconds (2709mGy) COMPLICATIONS: None immediate. TECHNIQUE: Informed  written consent was obtained from the patient after a discussion of the risks, benefits and alternatives to treatment. Questions regarding the procedure were encouraged and answered. A timeout was performed prior to the initiation of the procedure. The midline of the upper abdominal quadrant was prepped and draped in the usual sterile fashion, and a sterile drape was applied covering the operative field. Maximum barrier sterile technique with sterile gowns and gloves were used for the procedure. A timeout was performed prior to the initiation of the procedure. Under direct ultrasound guidance, a dilated duct within the peripheral aspect of the left lobe of the liver was accessed with a 2Montereyneedle. Contrast injection confirmed appropriate positioning. A Nitrex wire was advanced to the level of the malignant obstruction at the level the biliary hilum. Despite prolonged efforts, the inner 3 French catheter from the [place] could not be advanced into the biliary tree given the cranial location of the left lobe of the liver as well as the angulation of the accessed duct. As such, the more peripheral aspect of the dominant dilated duct within the left lobe of the liver was accessed with an additional 2[place]needle. A Nitrex wire was advanced to the level of the malignant obstruction at the level of the biliary hilum. Again, there is difficulty advancing the inner 3 French catheter from the Accustick set and as such, a stiff micro puncture sheath sheath was utilized to secure the access in to biliary tree and allowed for placement of an Amplatz wire which was coiled within the biliary hilum. Under intermittent fluoroscopic guidance, the micropuncture sheath was exchanged for a a 4 French angled glide catheter and ultimately, a Kumpe catheter, which was utilized to manipulate the Amplatz wire through the common bile duct to the level of the duodenum. Contrast injection confirmed appropriate  positioning. The Kumpe catheter was utilized for measurement purposes. Additional sideholes were cut approximately 2 cm peripheral to the radiopaque marker of an 8 French percutaneous biliary drainage catheter. Under intermittent fluoroscopic guidance, an 8 French percutaneous biliary drainage catheter was advanced over an Amplatz wire with tip ultimately coiled within the descending duodenum. Contrast injection was performed demonstrating appropriate positioning and functionality of the biliary drainage catheter. Multiple spot fluoroscopic radiographic image were obtained in various obliquities. The catheter was connected to a drainage bag which yielded the brisk return of clear bile. The catheter was secured to the skin with an interrupted suture. The patient tolerated the procedure well without immediate postprocedural complication. FINDINGS: Ultrasound scanning demonstrates several hypoechoic lesions within the left lobe of liver compatible with known hepatic metastatic disease. Moderate intrahepatic biliary duct dilatation was again demonstrated within the left lobe of the liver Ultimately, under direct ultrasound guidance, a peripherally dilated duct in the left lobe of the liver was accessed allowing placement of a 8 French percutaneous biliary drainage catheter with and coiled and locked within the duodenum. Note, an 853French percutaneous drain was placed in lieu of a 10 [place] there was difficulty advancing the Accustick set due  to the cranial location of the left lobe of the liver and required angulation of the access bile duct. IMPRESSION: Successful placement of an 8 French percutaneous biliary drainage catheter with end coiled and locked within the duodenum. PLAN: - Patient will be followed with daily CMP levels in hopes of improvement in serum bilirubin levels. - Pending the trend of the patient's LFTs, the patient will likely return for repeat cholangiogram and potential biliary drainage catheter  exchange and up sizing with potential capping later this week. - Ultimately, the patient's bilirubin will need to improve 2 to be a candidate for Y 90 Radioembolization. Electronically Signed   By: Sandi Mariscal M.D.   On: 09/06/2016 11:15   Ct Angio Abd/pel W/ And/or W/o  Result Date: 09/05/2016 CLINICAL DATA:  History of metastatic breast cancer with progressive hepatic metastatic disease. Please perform CTA for evaluation of candidacy for Y 90 radioembolization. EXAM: CTA ABDOMEN AND PELVIS WITH CONTRAST TECHNIQUE: Multidetector CT imaging of the abdomen and pelvis was performed using the standard protocol during bolus administration of intravenous contrast. Multiplanar reconstructed images and MIPs were obtained and reviewed to evaluate the vascular anatomy. CONTRAST:  100 cc Isovue 370 COMPARISON:  Abdominal MRI - 08/29/2016 ; 07/24/2016; PET-CT -05/14/2016 FINDINGS: VASCULAR Aorta: Minimal amount of atherosclerotic plaque within a normal caliber abdominal aorta, not resulting in hemodynamically significant stenosis. No abdominal aortic dissection or periaortic stranding. Celiac: Widely patent. Conventional takeoff of the GDA. Note is made of separate origins of the left lateral hepatic segmental artery with middle hepatic artery which supplies the medial segment of the left lobe of the liver as well as a portion of the anterior segment of the right lobe of the liver. Otherwise, conventional branching pattern. The right gastric artery is not definitely identified. SMA: Widely patent.  Conventional branching pattern. Renals: Solitary bilaterally. The bilateral renal arteries are widely patent without hemodynamically significant narrowing. No vessel irregularity to suggest FMD. IMA: Widely patent. Inflow: The bilateral common, external and internal iliac artery is are tortuous lobe normal caliber and widely patent without hemodynamically significant stenosis. Proximal Outflow: The bilateral common, superficial  and deep femoral arteries appear widely patent throughout their imaged course. Veins: The IVC and pelvic venous system appears widely patent. Review of the MIP images confirms the above findings. NON-VASCULAR Lower chest: Limited visualization of the lower thorax demonstrates consolidative opacities within the imaged left lower lobe with associated volume loss and trace left-sided pleural effusion. Known hypermetabolic nodule within the medial basilar aspect the left pleural space is grossly unchanged measuring 1.5 cm in diameter (image 8, series 5). No new focal airspace opacities. Normal heart size.  No pericardial effusion. Hepatobiliary: Normal hepatic contour, however loculated fluid/bile is noted about the subcapsular aspect of the right lobe of the liver (image 29, series 16), similar to recent abdominal MRI. Re- demonstrated extensive hepatic metastatic disease, similar to recently obtained abdominal MRI with dominant lesion within the dome of the right lobe of liver measuring approximately 2.6 x 2.6 cm (image 24, series 6 dominant lesion within the subcapsular aspect the right lobe of the liver measuring approximately 3.0 x 3.7 cm (image 42, series 6) and dominant lesion within the subcapsular aspect the right lobe of the liver measuring approximately 7.5 x 4.2 cm (image 66). Re- demonstrated intrahepatic biliary duct dilatation with occlusion of the biliary system at the level the hilum. The main portal vein is tapered/narrowed at the level of the biliary hilum though needs patent. A laminated approximately  1.6 cm gallstone is noted within the fundus of the gallbladder (image 86, series 4) with associated minimal amount of pericholecystic stranding. Pancreas: Normal appearance of the pancreas Spleen: Normal appearance of the spleen Adrenals/Urinary Tract: There is symmetric enhancement and excretion of the bilateral kidneys. No definite renal stones this postcontrast examination. No discrete renal lesions.  No urine obstruction or perinephric stranding. Normal appearance the bilateral adrenal glands. Normal appearance of the urinary bladder given degree distention. Stomach/Bowel: Scattered colonic diverticulosis without evidence of diverticulitis. Normal appearance of the terminal ileum. The appendix is not visualized, however there is no pericecal inflammatory change. No pneumoperitoneum, pneumatosis or portal venous gas. Lymphatic: Compared to PET-CT performed 05/14/2016, there has been development of porta hepatis and retroperitoneal lymphadenopathy with index left-sided periaortic lymph node measuring 1.3 cm in greatest short axis diameter (image 70, series 4), index aortocaval lymph nodes measuring 1.2 cm (image 79) and 1.1 cm (image 100) and index gastrohepatic lymph ligament lymph node measuring 0.9 cm (image 46, series 4 Reproductive: Normal appearance of the pelvic organs. Small amount of free fluid in the pelvic cul-de-sac. Other: Regional soft tissues appear normal. Musculoskeletal: Moderate scoliotic curvature of the thoracolumbar spine with dominant caudal component convex to the left measuring approximately 30 degrees (as measured from the superior endplate of L79 to the inferior endplate of L4. Similar appearance of known pathologic fracture involving the right inferior pubic ramus (image 205, series 4). Approximately 1.3 x 1.5 cm sclerotic lesion within the right-side of the T11 vertebral body is unchanged (image 15, series 16). Potential new approximately 1.8 x 1.7 cm lytic lesion involving the posterior aspects of the L3 vertebral body (sagittal image 115, series 9). IMPRESSION: VASCULAR 1. Minimal amount of atherosclerotic plaque with a normal caliber abdominal aorta. Aortic Atherosclerosis (ICD10-I70.0). 2. Conventional anatomy amenable to Y 90 radioembolization. NON-VASCULAR 1. Compared to recent obtain abdominal MRI, unchanged extensive hepatic metastatic disease, primarily affecting the right  lobe of the liver with obstruction of the biliary system at the level the hilum with associated intrahepatic biliary duct dilatation affecting both the right and left lobes of the liver. 2. Compared to PET-CT performed 05/14/2016, interval development of porta hepatis and retroperitoneal lymphadenopathy as detailed above. 3. Grossly unchanged metastatic disease within the imaged left lower lung / pleural as detailed above. 4. Potential new approximately 1.8 cm lytic lesion involving the posterior aspect of the L3 vertebral body. Further evaluation lumbar spine MRI could performed as clinically indicated. Electronically Signed   By: Sandi Mariscal M.D.   On: 09/05/2016 16:57    Labs:  CBC:  Recent Labs  08/30/16 0752 09/02/16 1127 09/05/16 1148 09/06/16 0607  WBC 7.2 12.5* 11.8* 9.6  HGB 11.9* 11.7 11.7 10.4*  HCT 32.8* 33.1* 34.1* 29.7*  PLT 514* 477* 469* 473*    COAGS:  Recent Labs  08/27/16 0404 08/28/16 0721 08/29/16 0508 09/05/16 1148  INR 1.07 1.07 1.11 1.30*    BMP:  Recent Labs  08/29/16 0508 08/30/16 0752 09/02/16 1127 09/05/16 1156 09/06/16 0607 09/07/16 0346  NA 135 133* 132* 132* 132* 128*  K 3.7 3.8 3.4* 3.3* 3.3* 3.4*  CL 104 102  --   --  98* 95*  CO2 _0 GLUCOSE 97 155* 138 79 105* 111*  BUN <5* 5* 14.0 17.0 11 11  CALCIUM 7.7* 8.1* 8.5 9.1 8.0* 7.4*  CREATININE 0.33* 0.33* 0.7 0.8 <0.30* <0.30*  GFRNONAA >60 >60  --   --  NOT CALCULATED NOT CALCULATED  GFRAA >60 >60  --   --  NOT CALCULATED NOT CALCULATED    LIVER FUNCTION TESTS:  Recent Labs  09/02/16 1127 09/05/16 1156 09/06/16 0607 09/07/16 0346  BILITOT 15.38* 19.40* 18.4* 19.2*  AST 430* 439* 331* 254*  ALT 531* 719* 550* 489*  ALKPHOS 743* 899* 766* 724*  PROT 5.9* 5.8* 5.3* 5.5*  ALBUMIN 2.8* 2.7* 2.4* 2.7*    Assessment and Plan: Intrahepatic cholestasis, 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 Drain remains in place today.  Her Tbili has improved from 19.4  15.4 She is uncomfortable at time of visit due to constipation.  Recent changes made for bowel regimen per RN.  Continue routine drain care.  Plan is for cholangiogram and possible upsize of drain early next week per Dr. Pascal Lux. Discussed this with patient who states "can we just take it slow? If it's working I want everything to just stay as it is." Patient's drainage catheter was also placed in hopes of optimizing her candidacy for potential Y90 or additional systemic therapies.  IR to follow.   Electronically Signed: Docia Barrier, PA 09/07/2016, 7:41 AM   I spent a total of 15 Minutes at the the patient's bedside AND on the patient's hospital floor or unit, greater than 50% of which was counseling/coordinating care for obstructive jaundice

## 2016-09-08 ENCOUNTER — Ambulatory Visit: Payer: 59 | Admitting: Physical Therapy

## 2016-09-08 ENCOUNTER — Other Ambulatory Visit: Payer: 59

## 2016-09-08 LAB — COMPREHENSIVE METABOLIC PANEL
ALK PHOS: 530 U/L — AB (ref 38–126)
ALT: 312 U/L — AB (ref 14–54)
AST: 157 U/L — AB (ref 15–41)
Albumin: 2.2 g/dL — ABNORMAL LOW (ref 3.5–5.0)
Anion gap: 7 (ref 5–15)
BUN: 13 mg/dL (ref 6–20)
CALCIUM: 6.4 mg/dL — AB (ref 8.9–10.3)
CHLORIDE: 97 mmol/L — AB (ref 101–111)
CO2: 22 mmol/L (ref 22–32)
Glucose, Bld: 95 mg/dL (ref 65–99)
Potassium: 3.7 mmol/L (ref 3.5–5.1)
Sodium: 126 mmol/L — ABNORMAL LOW (ref 135–145)
Total Bilirubin: 16.2 mg/dL — ABNORMAL HIGH (ref 0.3–1.2)
Total Protein: 5 g/dL — ABNORMAL LOW (ref 6.5–8.1)

## 2016-09-08 LAB — MAGNESIUM: Magnesium: 1.7 mg/dL (ref 1.7–2.4)

## 2016-09-08 MED ORDER — SODIUM CHLORIDE 0.9 % IV SOLN
1.0000 g | Freq: Once | INTRAVENOUS | Status: AC
  Administered 2016-09-08: 1 g via INTRAVENOUS
  Filled 2016-09-08: qty 10

## 2016-09-08 NOTE — Progress Notes (Signed)
Patient refused dressing change.  Patient stated "I would prefer for PA to change the first dressing when rounding this morning."

## 2016-09-08 NOTE — Progress Notes (Signed)
[name] [name]   DOB:May 10, 1960   OE#:[id number]   HNG#:[id number]  Subjective: [name] [name] slept in recliner as bed too uncomfortable; pain controlled on tramadol and more frequent toradol; taking clear liquids, hoping to tolerate simple carbs today; no BM x 3 days, does not want to take laxative; many questions regarding whether or not to proceed with further catheter drainage manouvers; husband in room   Objective: middle aged White woman examined in recliner  Vitals:   09/07/16 2158 09/08/16 0521  BP: (!) 155/94 (!) 148/76  Pulse: 95 (!) 101  Resp: 18 18  Temp: 99.1 F (37.3 C) 97.8 F (36.6 C)    Body mass index is 27.46 kg/m.  Intake/Output Summary (Last 24 hours) at 09/08/16 0657 Last data filed at 09/08/16 0549  Gross per 24 hour  Intake             1785 ml  Output            702.5 ml  Net           1082.5 ml     Oropharynx clear and moist  No cervical or supraclavicular adenopathy  Lungs no rales or wheezes  Heart regular rate and rhythm  Abdomen soft, NT to mild palpation, no BS heard, drain in place, bad recently emptied  Neuro nonfocal  Breast exam: deferred  CBG (last 3)  No results for input(s): GLUCAP in the last 72 hours.   Labs:  Lab Results  Component Value Date   WBC 9.6 09/06/2016   HGB 10.4 (L) 09/06/2016   HCT 29.7 (L) 09/06/2016   MCV 99.3 09/06/2016   PLT 473 (H) 09/06/2016   NEUTROABS 7.9 (H) 09/06/2016    @LASTCHEMISTRY @  Urine Studies No results for input(s): UHGB, CRYS in the last 72 hours.  Invalid input(s): UACOL, UAPR, USPG, UPH, UTP, UGL, UKET, UBIL, UNIT, UROB, ULEU, UEPI, UWBC, URBC, [name], [place]  Basic Metabolic Panel:  Recent Labs Lab 09/02/16 1127 09/05/16 1156  09/06/16 0607 09/07/16 0346 09/08/16 0405  NA 132* 132*  --  132* 128* 126*  K 3.4* 3.3*  < > 3.3* 3.4* 3.7  CL  --   --   --  98* 95* 97*  CO2 25 26  --  26 23 22   GLUCOSE 138 79  --  105* 111* 95  BUN 14.0 17.0  --  11 11 13   CREATININE 0.7 0.8   --  <0.30* <0.30* <0.30*  CALCIUM 8.5 9.1  --  8.0* 7.4* 6.4*  < > = values in this interval not displayed. GFR CrCl cannot be calculated (This lab value cannot be used to calculate CrCl because it is not a number: <0.30). Liver Function Tests:  Recent Labs Lab 09/02/16 1127 09/05/16 1156 09/06/16 0607 09/07/16 0346 09/08/16 0405  AST 430* 439* 331* 254* 157*  ALT 531* 719* 550* 489* 312*  ALKPHOS 743* 899* 766* 724* 530*  BILITOT 15.38* 19.40* 18.4* 19.2* 16.2*  PROT 5.9* 5.8* 5.3* 5.5* 5.0*  ALBUMIN 2.8* 2.7* 2.4* 2.7* 2.2*   No results for input(s): LIPASE, AMYLASE in the last 168 hours. No results for input(s): AMMONIA in the last 168 hours. Coagulation profile  Recent Labs Lab 09/05/16 1148  INR 1.30*  PROTIME 15.6*    CBC:  Recent Labs Lab 09/02/16 1127 09/05/16 1148 09/06/16 0607  WBC 12.5* 11.8* 9.6  NEUTROABS 10.6* 9.6* 7.9*  HGB 11.7 11.7 10.4*  HCT 33.1* 34.1* 29.7*  MCV 100.0 101.2* 99.3  PLT 477* 469* 473*   Cardiac Enzymes: No results for input(s): CKTOTAL, CKMB, CKMBINDEX, TROPONINI in the last 168 hours. BNP: Invalid input(s): POCBNP CBG: No results for input(s): GLUCAP in the last 168 hours. D-Dimer No results for input(s): DDIMER in the last 72 hours. Hgb A1c No results for input(s): HGBA1C in the last 72 hours. Lipid Profile No results for input(s): CHOL, HDL, LDLCALC, TRIG, CHOLHDL, LDLDIRECT in the last 72 hours. Thyroid function studies No results for input(s): TSH, T4TOTAL, T3FREE, THYROIDAB in the last 72 hours.  Invalid input(s): FREET3 Anemia work up No results for input(s): VITAMINB12, FOLATE, FERRITIN, TIBC, IRON, RETICCTPCT in the last 72 hours. Microbiology Recent Results (from the past 240 hour(s))  TECHNOLOGIST REVIEW     Status: None   Collection Time: 09/05/16 11:48 AM  Result Value Ref Range Status   Technologist Review   Final    Few Metas and Myelocytes present, mod target cells       Studies:  Ir Int  Lianne Cure Biliary Drain With Cholangiogram  Result Date: 09/06/2016 INDICATION: History of metastatic breast cancer, now with malignant obstructive jaundice. Request made for placement of percutaneous biliary drainage catheter in hopes of optimizing liver function tests to improve patient's candidacy for potential Y 90 or additional systemic therapies. EXAM: ULTRASOUND AND FLUOROSCOPIC GUIDED PERCUTANEOUS TRANSHEPATIC CHOLANGIOGRAM AND BILIARY TUBE PLACEMENT COMPARISON:  CT abdomen pelvis - 09/05/2016; abdominal MRI - 08/29/2016 MEDICATIONS: Zosyn 3.375 g IV ; the antibiotic was administered with an appropriate time frame prior to the initiation of the procedure. CONTRAST:  58m ISOVUE-300 IOPAMIDOL (ISOVUE-300) INJECTION 61% - administered into the biliary tree ANESTHESIA/SEDATION: Moderate (conscious) sedation was employed during this procedure. A total of Versed 5 mg and Fentanyl 250 mcg was administered intravenously. Moderate Sedation Time: 57 minutes. The patient's level of consciousness and vital signs were monitored continuously by radiology nursing throughout the procedure under my direct supervision. FLUOROSCOPY TIME:  10 minutes 54 seconds (2144mGy) COMPLICATIONS: None immediate. TECHNIQUE: Informed written consent was obtained from the patient after a discussion of the risks, benefits and alternatives to treatment. Questions regarding the procedure were encouraged and answered. A timeout was performed prior to the initiation of the procedure. The midline of the upper abdominal quadrant was prepped and draped in the usual sterile fashion, and a sterile drape was applied covering the operative field. Maximum barrier sterile technique with sterile gowns and gloves were used for the procedure. A timeout was performed prior to the initiation of the procedure. Under direct ultrasound guidance, a dilated duct within the peripheral aspect of the left lobe of the liver was accessed with a 2[place]needle.  Contrast injection confirmed appropriate positioning. A Nitrex wire was advanced to the level of the malignant obstruction at the level the biliary hilum. Despite prolonged efforts, the inner 3 French catheter from the [place] could not be advanced into the biliary tree given the cranial location of the left lobe of the liver as well as the angulation of the accessed duct. As such, the more peripheral aspect of the dominant dilated duct within the left lobe of the liver was accessed with an additional 2Bruningneedle. A Nitrex wire was advanced to the level of the malignant obstruction at the level of the biliary hilum. Again, there is difficulty advancing the inner 3 French catheter from the Accustick set and as such, a stiff micro puncture sheath sheath was utilized to secure the access in to biliary tree and  allowed for placement of an Amplatz wire which was coiled within the biliary hilum. Under intermittent fluoroscopic guidance, the micropuncture sheath was exchanged for a a 4 French angled glide catheter and ultimately, a Kumpe catheter, which was utilized to manipulate the Amplatz wire through the common bile duct to the level of the duodenum. Contrast injection confirmed appropriate positioning. The Kumpe catheter was utilized for measurement purposes. Additional sideholes were cut approximately 2 cm peripheral to the radiopaque marker of an 8 French percutaneous biliary drainage catheter. Under intermittent fluoroscopic guidance, an 8 French percutaneous biliary drainage catheter was advanced over an Amplatz wire with tip ultimately coiled within the descending duodenum. Contrast injection was performed demonstrating appropriate positioning and functionality of the biliary drainage catheter. Multiple spot fluoroscopic radiographic image were obtained in various obliquities. The catheter was connected to a drainage bag which yielded the brisk return of clear bile. The catheter was secured to the  skin with an interrupted suture. The patient tolerated the procedure well without immediate postprocedural complication. FINDINGS: Ultrasound scanning demonstrates several hypoechoic lesions within the left lobe of liver compatible with known hepatic metastatic disease. Moderate intrahepatic biliary duct dilatation was again demonstrated within the left lobe of the liver Ultimately, under direct ultrasound guidance, a peripherally dilated duct in the left lobe of the liver was accessed allowing placement of a 8 French percutaneous biliary drainage catheter with and coiled and locked within the duodenum. Note, an 8 [place] percutaneous drain was placed in lieu of a 10 [place] as there was difficulty advancing the Accustick set due to the cranial location of the left lobe of the liver and required angulation of the access bile duct. IMPRESSION: Successful placement of an 8 French percutaneous biliary drainage catheter with end coiled and locked within the duodenum. PLAN: - Patient will be followed with daily CMP levels in hopes of improvement in serum bilirubin levels. - Pending the trend of the patient's LFTs, the patient will likely return for repeat cholangiogram and potential biliary drainage catheter exchange and up sizing with potential capping later this week. - Ultimately, the patient's bilirubin will need to improve 2 to be a candidate for Y 90 Radioembolization. Electronically Signed   By: Sandi Mariscal M.D.   On: 09/06/2016 11:15    Assessment: 56 y.o. BRCA negative [place] woman with stage IV breast cancer, history as follows  (1)  S/p Right upper inner quadrant lumpectomy and sentinel lymph node sampling 03/15/2004 for a pT1c pN0. Stage IA invasive ductal carcinoma, grade 2, estrogen receptor 95% positive, progesterone receptor 65% positive, HER-2 not amplified; additional surgery 04/25/2004 for seroma or clearance showed no residual tumor  (2) adjuvant chemotherapy with cyclophosphamide and  doxorubicin every 21 days x4 completed 07/19/2004  (3) adjuvant radiation given under Dr. Donella Stade in [place] completed July 2006  (4) the patient opted against adjuvant antiestrogen therapy  (5) genetics testing showed no BRCA mutations  (6) biopsy of a palpable right axillary mass 10/24/2009 showed invasive ductal carcinoma, grade 3, estrogen receptor 100% positive, progesterone receptor 2% positive (alert score 5) HER-2 negative; no evidence of systemic disease on PET scanning  (7) completed 3 of 4 planned cycles of docetaxel and cyclophosphamide September 2011, fourth cycle omitted because of marked elevations in liver function tests  (8) an right axillary lymph node dissection 03/06/2010 showed 3/8 lymph nodes removed to be involved by tumor, with extracapsular extension.  (9) 45 Gy radiation to the right axillary and right supraclavicular nodal areas, with capecitabine sensitization, completed  March 2012   (10) intolerant of letrozole and exemestane; on tamoxifen with interruptions September 2012 to March 2013, but then continuing on tamoxifen more continuously through March of 2015  (11) biopsy of mediastinal adenopathy 06/02/2013 shows invasive ductal carcinoma (gross cystic disease fluid protein positive, TTS-1 negative), estrogen receptor 80% positive, progesterone receptor 2% positive, HER-2 not amplified  (12) letrozole started March 2015-- tolerated with significant side effects, discontinued at the end of May 2015  (13) PET scan 08/16/2013 shows extensive left pleural metastatic disease and a large left pleural effusion that shifts cardiac and mediastinal structures to the right; adenopathy (celiac trunk, periadrenal, periaortic); and a left medial clavicular lesion; Status post left thoracentesis 08/16/2013 positive for adenocarcinoma, estrogen receptor positive, progesterone receptor negative.  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; everolimus was suggested as an available targeted agent  (18) exemestane started 03/31/2014, discontinued 10/31/2014 with evidence of progression             (a) everolimus added 04/03/2014 but not tolerated (cytopenias, elevated LFTs) even at minimal doses; stopped 04/17/2014  (19) fulvestrant started 12/20/2014             (a) palbociclib added at very low dose 04/03/2015 (starting dose 75 mg weekly)             (b) palbociclib dose gradually increased to 75 mg daily, 21/7, as of May 2017             (c) palbociclib dose increased to 100 mg daily, 21/ 7, beginning November mid- cycle             (d) palbociclib dose decreased to 75 mg daily beginning with cycle starting 05/25/2016             (e) letrozole 2.5 mg started 05/26/2016  (20) liver biopsy 03/20/2015 confirms metastatic carcinoma, still estrogen receptor positive at 100%, progesterone receptor negative, HER-2 equivocal with a signals ratio 1.41, number per cell 4.50.              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 treated with Lovenox for 5-6 months  (c) right  upper extremity lymphedema--receiving physical therapy  (d) hepatic steatosis with chronically elevated LFTs as well as unusual hepatic sensitivity to chemotherapy  (e) osteopenia with the lowest T score -1.6 on bone density scan 06/20/2013             (i) on denosumab/ Xgeva Q28d  (f) radiation oncology (Dr Valere Dross) has reviewed prior radiation records in case there is further mediastinal involvement with dysphagia etc in which case palliative XRT could be considered             (a) radiation to left mediastinum/ left 7th rib 3250 cGy in 13 sessions04/18/2016 through 08/02/2014             (b) radiation to T11 area: 22 Gy in 7 sessions, last dose 11/27/2014             (c) radiation left parietal scalp region to be completed 12/22/2014             (d) radiation to sacral area completed 04/09/2015             (e) radiation to right inferior pelvis and left ninth rib (24 gray, 12/24/2015--01/02/2016)             (f) T11 was treated stereotactically with 14 gray in 1 fraction 03/12/2016.  (g) chest wall and perineal pain--improved post radiation treatments             (a) discussed celebrex/ carafate but holding off for now  (h) zoster diagnosed 04/04/2015-- on valacyclovir--resolved   PLAN: Total bili and LFTs are better. Further drainage procedures being considered. She will discuss with Dr Pascal Lux.  Pain is moderately well controlled on current medications. Will transition to  oral once discharge in view (not next 24 hours)  Will confirm low calcium and check magnesium; will replace Ca++ IV for now.  Anticipate d/c to home sometime this week -- will need HHN support.       Chauncey Cruel, MD 09/08/2016  6:57 AM Medical Oncology and Hematology [facility] [address] [place], [place] [address] Tel. [phone]    Fax. [phone]

## 2016-09-08 NOTE — Progress Notes (Signed)
CRITICAL VALUE ALERT  Critical Value:  calcium 6.4  Date & Time Notied:  09/08/2016  05:04  Provider Notified: Donneta Romberg, MD  Orders Received/Actions taken: No new orders at this time.  Will continue to monitor

## 2016-09-08 NOTE — Progress Notes (Signed)
Referring Physician(s): Chauncey Cruel  Supervising Physician: Corrie Mckusick  Patient Status:  [name] [name]  Chief Complaint:  Metastatic breast cancer, biliary obstruction  Subjective: Patient doing fairly well today. Ambulating in hallway. Last BM on Friday. Passing small amounts of gas. Denies nausea/ vomiting. Has some mild epigastric and mild to moderate lower pelvic discomfort.   Allergies: 2nd skin quick heal; Decadron [dexamethasone]; Dilaudid [hydromorphone]; Enoxaparin; Fluconazole; Hydromorphone hcl; Morphine and related; Ondansetron; Protonix [pantoprazole sodium]; and Tegaderm ag mesh [silver]  Medications: Prior to Admission medications   Medication Sig Start Date End Date Taking? Authorizing Provider  acetaminophen (TYLENOL) 500 MG tablet Take 1 tablet (500 mg total) by mouth 4 (four) times daily -  with meals and at bedtime. 08/30/16  Yes Gherghe, Vella Redhead, MD  B Complex-C (B-COMPLEX WITH VITAMIN C) tablet Take 1 tablet by mouth daily. Reported on 03/27/2015 01/19/15  Yes Magrinat, Virgie Dad, MD  cholecalciferol 2000 UNITS tablet Take 1 tablet (2,000 Units total) by mouth daily. 01/19/15  Yes Magrinat, Virgie Dad, MD  famotidine (PEPCID) 40 MG tablet Take 0.5-1 tablets (20-40 mg total) by mouth 2 (two) times daily. 40 mg in the morning and 20 mg in the evening Patient taking differently: Take 20 mg by mouth 2 (two) times daily.  08/30/16  Yes Gherghe, Vella Redhead, MD  folic acid (FOLVITE) 1 MG tablet Take 1 tablet (1 mg total) by mouth daily. 01/19/15  Yes Magrinat, Virgie Dad, MD  LORazepam (ATIVAN) 0.5 MG tablet Take 1 tablet (0.5 mg total) by mouth every 8 (eight) hours as needed for anxiety. 08/30/16 08/30/17 Yes Gherghe, Vella Redhead, MD  Melatonin 3 MG TABS Take 3 mg by mouth at bedtime.   Yes [provider]  naproxen (NAPROSYN) 250 MG tablet Take 1 tablet (250 mg total) by mouth 4 (four) times daily -  with meals and at bedtime. 08/30/16  Yes Gherghe, Vella Redhead, MD    saccharomyces boulardii (FLORASTOR) 250 MG capsule Take 250 mg by mouth daily.  03/23/15  Yes Magrinat, Virgie Dad, MD  ciprofloxacin (CIPRO) 500 MG tablet Take 1 tablet (500 mg total) by mouth 2 (two) times daily. Patient not taking: Reported on 09/05/2016 08/30/16   Caren Griffins, MD  dexamethasone (DECADRON) 4 MG tablet Take 1 tablet (4 mg total) by mouth 2 (two) times daily. Patient not taking: Reported on 09/05/2016 08/30/16   Caren Griffins, MD  ketorolac (TORADOL) 10 MG tablet Take 1 tablet (10 mg total) by mouth every 6 (six) hours as needed. 08/30/16   Caren Griffins, MD  phenazopyridine (PYRIDIUM) 100 MG tablet Take 1 tablet (100 mg total) by mouth 3 (three) times daily as needed for pain. Patient not taking: Reported on 08/15/2016 05/16/16   Magrinat, Virgie Dad, MD  RABEprazole Sodium 5 MG CPSP Take 5 mg by mouth daily. Patient not taking: Reported on 09/05/2016 10/08/15   Magrinat, Virgie Dad, MD  vitamin E (VITAMIN E) 400 UNIT capsule Take 1 capsule (400 Units total) by mouth 2 (two) times daily. Patient not taking: Reported on 08/15/2016 05/16/16   Kyung Rudd, MD     Vital Signs: BP (!) 148/76 (BP Location: Left Leg)   Pulse (!) 101   Temp 97.8 F (36.6 C) (Oral)   Resp 18   Ht 5\' 4"  (1.626 m)   Wt 160 lb (72.6 kg)   SpO2 98%   BMI 27.46 kg/m   Physical Exam hepatic drain intact, output 250 mL of  bile, insertion site clean and dry, mildly tender to palpation abdomen soft  Imaging: Ir Fluoro Guide Cv Line Left  Result Date: 09/05/2016 INDICATION: Metastatic breast cancer to liver, poor venous access; request made for central venous access . EXAM: LEFT UPPER EXTREMITY PICC LINE PLACEMENT WITH ULTRASOUND AND FLUOROSCOPIC GUIDANCE MEDICATIONS: None ANESTHESIA/SEDATION: None FLUOROSCOPY TIME:  Fluoroscopy Time:  42 seconds COMPLICATIONS: None immediate. PROCEDURE: The patient was advised of the possible risks and complications and agreed to undergo the procedure. The patient was then  brought to the angiographic suite for the procedure. The left arm was prepped with chlorhexidine, draped in the usual sterile fashion using maximum barrier technique (cap and mask, sterile gown, sterile gloves, large sterile sheet, hand hygiene and cutaneous antisepsis) and infiltrated locally with 1% Lidocaine. Ultrasound demonstrated patency of the left brachial vein, and this was documented with an image. Under real-time ultrasound guidance, this vein was accessed with a 21 gauge micropuncture needle and image documentation was performed. A 0.018 wire was introduced in to the vein. Over this, a 5 [place] double lumen power injectable PICC was advanced to the lower SVC/right atrial junction. Fluoroscopy during the procedure and fluoro spot radiograph confirms appropriate catheter position. The catheter was flushed and covered with a sterile dressing. Catheter length:  41 cm IMPRESSION: Successful left arm power PICC line placement with ultrasound and fluoroscopic guidance. The catheter is ready for use. Read by: Rowe Robert, PA-C Electronically Signed   By: Markus Daft M.D.   On: 09/05/2016 16:00   Ir US Guide Vasc Access Left  Result Date: 09/05/2016 INDICATION: Metastatic breast cancer to liver, poor venous access; request made for central venous access . EXAM: LEFT UPPER EXTREMITY PICC LINE PLACEMENT WITH ULTRASOUND AND FLUOROSCOPIC GUIDANCE MEDICATIONS: None ANESTHESIA/SEDATION: None FLUOROSCOPY TIME:  Fluoroscopy Time:  42 seconds COMPLICATIONS: None immediate. PROCEDURE: The patient was advised of the possible risks and complications and agreed to undergo the procedure. The patient was then brought to the angiographic suite for the procedure. The left arm was prepped with chlorhexidine, draped in the usual sterile fashion using maximum barrier technique (cap and mask, sterile gown, sterile gloves, large sterile sheet, hand hygiene and cutaneous antisepsis) and infiltrated locally with 1% Lidocaine.  Ultrasound demonstrated patency of the left brachial vein, and this was documented with an image. Under real-time ultrasound guidance, this vein was accessed with a 21 gauge micropuncture needle and image documentation was performed. A 0.018 wire was introduced in to the vein. Over this, a 5 [place] double lumen power injectable PICC was advanced to the lower SVC/right atrial junction. Fluoroscopy during the procedure and fluoro spot radiograph confirms appropriate catheter position. The catheter was flushed and covered with a sterile dressing. Catheter length:  41 cm IMPRESSION: Successful left arm power PICC line placement with ultrasound and fluoroscopic guidance. The catheter is ready for use. Read by: Rowe Robert, PA-C Electronically Signed   By: Markus Daft M.D.   On: 09/05/2016 16:00   Ir Int Lianne Cure Biliary Drain With Cholangiogram  Result Date: 09/06/2016 INDICATION: History of metastatic breast cancer, now with malignant obstructive jaundice. Request made for placement of percutaneous biliary drainage catheter in hopes of optimizing liver function tests to improve patient's candidacy for potential Y 90 or additional systemic therapies. EXAM: ULTRASOUND AND FLUOROSCOPIC GUIDED PERCUTANEOUS TRANSHEPATIC CHOLANGIOGRAM AND BILIARY TUBE PLACEMENT COMPARISON:  CT abdomen pelvis - 09/05/2016; abdominal MRI - 08/29/2016 MEDICATIONS: Zosyn 3.375 g IV ; the antibiotic was administered with an appropriate time frame  prior to the initiation of the procedure. CONTRAST:  23mL ISOVUE-300 IOPAMIDOL (ISOVUE-300) INJECTION 61% - administered into the biliary tree ANESTHESIA/SEDATION: Moderate (conscious) sedation was employed during this procedure. A total of Versed 5 mg and Fentanyl 250 mcg was administered intravenously. Moderate Sedation Time: 57 minutes. The patient's level of consciousness and vital signs were monitored continuously by radiology nursing throughout the procedure under my direct supervision. FLUOROSCOPY  TIME:  10 minutes 54 seconds (803 mGy) COMPLICATIONS: None immediate. TECHNIQUE: Informed written consent was obtained from the patient after a discussion of the risks, benefits and alternatives to treatment. Questions regarding the procedure were encouraged and answered. A timeout was performed prior to the initiation of the procedure. The midline of the upper abdominal quadrant was prepped and draped in the usual sterile fashion, and a sterile drape was applied covering the operative field. Maximum barrier sterile technique with sterile gowns and gloves were used for the procedure. A timeout was performed prior to the initiation of the procedure. Under direct ultrasound guidance, a dilated duct within the peripheral aspect of the left lobe of the liver was accessed with a [place] needle. Contrast injection confirmed appropriate positioning. A Nitrex wire was advanced to the level of the malignant obstruction at the level the biliary hilum. Despite prolonged efforts, the inner 3 French catheter from the [place] set could not be advanced into the biliary tree given the cranial location of the left lobe of the liver as well as the angulation of the accessed duct. As such, the more peripheral aspect of the dominant dilated duct within the left lobe of the liver was accessed with an additional [place] needle. A Nitrex wire was advanced to the level of the malignant obstruction at the level of the biliary hilum. Again, there is difficulty advancing the inner 3 French catheter from the [place] set and as such, a stiff micro puncture sheath sheath was utilized to secure the access in to biliary tree and allowed for placement of an Amplatz wire which was coiled within the biliary hilum. Under intermittent fluoroscopic guidance, the micropuncture sheath was exchanged for a a 4 French angled glide catheter and ultimately, a Kumpe catheter, which was utilized to manipulate the Amplatz wire through the common  bile duct to the level of the duodenum. Contrast injection confirmed appropriate positioning. The Kumpe catheter was utilized for measurement purposes. Additional sideholes were cut approximately 2 cm peripheral to the radiopaque marker of an 8 French percutaneous biliary drainage catheter. Under intermittent fluoroscopic guidance, an 8 French percutaneous biliary drainage catheter was advanced over an Amplatz wire with tip ultimately coiled within the descending duodenum. Contrast injection was performed demonstrating appropriate positioning and functionality of the biliary drainage catheter. Multiple spot fluoroscopic radiographic image were obtained in various obliquities. The catheter was connected to a drainage bag which yielded the brisk return of clear bile. The catheter was secured to the skin with an interrupted suture. The patient tolerated the procedure well without immediate postprocedural complication. FINDINGS: Ultrasound scanning demonstrates several hypoechoic lesions within the left lobe of liver compatible with known hepatic metastatic disease. Moderate intrahepatic biliary duct dilatation was again demonstrated within the left lobe of the liver Ultimately, under direct ultrasound guidance, a peripherally dilated duct in the left lobe of the liver was accessed allowing placement of a 8 French percutaneous biliary drainage catheter with and coiled and locked within the duodenum. Note, an 63 [place] percutaneous drain was placed in lieu of a 10 [place]  as there was difficulty advancing the [place] set due to the cranial location of the left lobe of the liver and required angulation of the access bile duct. IMPRESSION: Successful placement of an 8 French percutaneous biliary drainage catheter with end coiled and locked within the duodenum. PLAN: - Patient will be followed with daily CMP levels in hopes of improvement in serum bilirubin levels. - Pending the trend of the patient's LFTs, the patient will  likely return for repeat cholangiogram and potential biliary drainage catheter exchange and up sizing with potential capping later this week. - Ultimately, the patient's bilirubin will need to improve 2 to be a candidate for Y 90 Radioembolization. Electronically Signed   By: Sandi Mariscal M.D.   On: 09/06/2016 11:15   Ct Angio Abd/pel W/ And/or W/o  Result Date: 09/05/2016 CLINICAL DATA:  History of metastatic breast cancer with progressive hepatic metastatic disease. Please perform CTA for evaluation of candidacy for Y 90 radioembolization. EXAM: CTA ABDOMEN AND PELVIS WITH CONTRAST TECHNIQUE: Multidetector CT imaging of the abdomen and pelvis was performed using the standard protocol during bolus administration of intravenous contrast. Multiplanar reconstructed images and MIPs were obtained and reviewed to evaluate the vascular anatomy. CONTRAST:  100 cc Isovue 370 COMPARISON:  Abdominal MRI - 08/29/2016 ; 07/24/2016; PET-CT -05/14/2016 FINDINGS: VASCULAR Aorta: Minimal amount of atherosclerotic plaque within a normal caliber abdominal aorta, not resulting in hemodynamically significant stenosis. No abdominal aortic dissection or periaortic stranding. Celiac: Widely patent. Conventional takeoff of the GDA. Note is made of separate origins of the left lateral hepatic segmental artery with middle hepatic artery which supplies the medial segment of the left lobe of the liver as well as a portion of the anterior segment of the right lobe of the liver. Otherwise, conventional branching pattern. The right gastric artery is not definitely identified. SMA: Widely patent.  Conventional branching pattern. Renals: Solitary bilaterally. The bilateral renal arteries are widely patent without hemodynamically significant narrowing. No vessel irregularity to suggest FMD. IMA: Widely patent. Inflow: The bilateral common, external and internal iliac artery is are tortuous lobe normal caliber and widely patent without  hemodynamically significant stenosis. Proximal Outflow: The bilateral common, superficial and deep femoral arteries appear widely patent throughout their imaged course. Veins: The IVC and pelvic venous system appears widely patent. Review of the MIP images confirms the above findings. NON-VASCULAR Lower chest: Limited visualization of the lower thorax demonstrates consolidative opacities within the imaged left lower lobe with associated volume loss and trace left-sided pleural effusion. Known hypermetabolic nodule within the medial basilar aspect the left pleural space is grossly unchanged measuring 1.5 cm in diameter (image 8, series 5). No new focal airspace opacities. Normal heart size.  No pericardial effusion. Hepatobiliary: Normal hepatic contour, however loculated fluid/bile is noted about the subcapsular aspect of the right lobe of the liver (image 29, series 16), similar to recent abdominal MRI. Re- demonstrated extensive hepatic metastatic disease, similar to recently obtained abdominal MRI with dominant lesion within the dome of the right lobe of liver measuring approximately 2.6 x 2.6 cm (image 24, series 6 dominant lesion within the subcapsular aspect the right lobe of the liver measuring approximately 3.0 x 3.7 cm (image 42, series 6) and dominant lesion within the subcapsular aspect the right lobe of the liver measuring approximately 7.5 x 4.2 cm (image 66). Re- demonstrated intrahepatic biliary duct dilatation with occlusion of the biliary system at the level the hilum. The main portal vein is tapered/narrowed at the level of  the biliary hilum though needs patent. A laminated approximately 1.6 cm gallstone is noted within the fundus of the gallbladder (image 86, series 4) with associated minimal amount of pericholecystic stranding. Pancreas: Normal appearance of the pancreas Spleen: Normal appearance of the spleen Adrenals/Urinary Tract: There is symmetric enhancement and excretion of the bilateral  kidneys. No definite renal stones this postcontrast examination. No discrete renal lesions. No urine obstruction or perinephric stranding. Normal appearance the bilateral adrenal glands. Normal appearance of the urinary bladder given degree distention. Stomach/Bowel: Scattered colonic diverticulosis without evidence of diverticulitis. Normal appearance of the terminal ileum. The appendix is not visualized, however there is no pericecal inflammatory change. No pneumoperitoneum, pneumatosis or portal venous gas. Lymphatic: Compared to PET-CT performed 05/14/2016, there has been development of porta hepatis and retroperitoneal lymphadenopathy with index left-sided periaortic lymph node measuring 1.3 cm in greatest short axis diameter (image 70, series 4), index aortocaval lymph nodes measuring 1.2 cm (image 79) and 1.1 cm (image 100) and index gastrohepatic lymph ligament lymph node measuring 0.9 cm (image 46, series 4 Reproductive: Normal appearance of the pelvic organs. Small amount of free fluid in the pelvic cul-de-sac. Other: Regional soft tissues appear normal. Musculoskeletal: Moderate scoliotic curvature of the thoracolumbar spine with dominant caudal component convex to the left measuring approximately 30 degrees (as measured from the superior endplate of M35 to the inferior endplate of L4. Similar appearance of known pathologic fracture involving the right inferior pubic ramus (image 205, series 4). Approximately 1.3 x 1.5 cm sclerotic lesion within the right-side of the T11 vertebral body is unchanged (image 15, series 16). Potential new approximately 1.8 x 1.7 cm lytic lesion involving the posterior aspects of the L3 vertebral body (sagittal image 115, series 9). IMPRESSION: VASCULAR 1. Minimal amount of atherosclerotic plaque with a normal caliber abdominal aorta. Aortic Atherosclerosis (ICD10-I70.0). 2. Conventional anatomy amenable to Y 90 radioembolization. NON-VASCULAR 1. Compared to recent obtain  abdominal MRI, unchanged extensive hepatic metastatic disease, primarily affecting the right lobe of the liver with obstruction of the biliary system at the level the hilum with associated intrahepatic biliary duct dilatation affecting both the right and left lobes of the liver. 2. Compared to PET-CT performed 05/14/2016, interval development of porta hepatis and retroperitoneal lymphadenopathy as detailed above. 3. Grossly unchanged metastatic disease within the imaged left lower lung / pleural as detailed above. 4. Potential new approximately 1.8 cm lytic lesion involving the posterior aspect of the L3 vertebral body. Further evaluation lumbar spine MRI could performed as clinically indicated. Electronically Signed   By: Sandi Mariscal M.D.   On: 09/05/2016 16:57    Labs:  CBC:  Recent Labs  08/30/16 0752 09/02/16 1127 09/05/16 1148 09/06/16 0607  WBC 7.2 12.5* 11.8* 9.6  HGB 11.9* 11.7 11.7 10.4*  HCT 32.8* 33.1* 34.1* 29.7*  PLT 514* 477* 469* 473*    COAGS:  Recent Labs  08/27/16 0404 08/28/16 0721 08/29/16 0508 09/05/16 1148  INR 1.07 1.07 1.11 1.30*    BMP:  Recent Labs  08/30/16 0752  09/05/16 1156 09/06/16 0607 09/07/16 0346 09/08/16 0405  NA 133*  < > 132* 132* 128* 126*  K 3.8  < > 3.3* 3.3* 3.4* 3.7  CL 102  --   --  98* 95* 97*  CO2 24  < > 26 26 23 22   GLUCOSE 155*  < > 79 105* 111* 95  BUN 5*  < > 17.0 11 11 13   CALCIUM 8.1*  < > 9.1 8.0*  7.4* 6.4*  CREATININE 0.33*  < > 0.8 <0.30* <0.30* <0.30*  GFRNONAA >60  --   --  NOT CALCULATED NOT CALCULATED NOT CALCULATED  GFRAA >60  --   --  NOT CALCULATED NOT CALCULATED NOT CALCULATED  < > = values in this interval not displayed.  LIVER FUNCTION TESTS:  Recent Labs  09/05/16 1156 09/06/16 0607 09/07/16 0346 09/08/16 0405  BILITOT 19.40* 18.4* 19.2* 16.2*  AST 439* 331* 254* 157*  ALT 719* 550* 489* 312*  ALKPHOS 899* 766* 724* 530*  PROT 5.8* 5.3* 5.5* 5.0*  ALBUMIN 2.7* 2.4* 2.7* 2.2*     Assessment and Plan: Patient with history of stage IV breast cancer with obstructive jaundice; status post internal/external biliary drain placement on 09/06/16; afebrile; total bilirubin 16.2(19.2), slight decrease in other LFTs; creatinine normal ;drain flushes without difficulty. Case discussed with Dr. Pascal Lux today. For now continue current drain and 3 times a day flushes; once total bilirubin decreases significantly then consider follow-up cholangiogram and possible biliary drain upsizing. Above discussed with patient.   Electronically Signed: D. Rowe Robert, PA-C 09/08/2016, 3:52 PM   I spent a total of 15 minutes at the the patient's bedside AND on the patient's hospital floor or unit, greater than 50% of which was counseling/coordinating care for biliary drain    Patient ID: [name] [name], female   DOB: 1960-06-11, 56 y.o.   MRN: [id number]

## 2016-09-09 ENCOUNTER — Ambulatory Visit: Payer: 59

## 2016-09-09 DIAGNOSIS — R110 Nausea: Secondary | ICD-10-CM

## 2016-09-09 LAB — COMPREHENSIVE METABOLIC PANEL
ALK PHOS: 401 U/L — AB (ref 38–126)
ALT: 254 U/L — ABNORMAL HIGH (ref 14–54)
ANION GAP: 9 (ref 5–15)
AST: 156 U/L — ABNORMAL HIGH (ref 15–41)
Albumin: 2 g/dL — ABNORMAL LOW (ref 3.5–5.0)
BILIRUBIN TOTAL: 15.4 mg/dL — AB (ref 0.3–1.2)
BUN: 13 mg/dL (ref 6–20)
CALCIUM: 6.2 mg/dL — AB (ref 8.9–10.3)
CO2: 20 mmol/L — ABNORMAL LOW (ref 22–32)
Chloride: 94 mmol/L — ABNORMAL LOW (ref 101–111)
Creatinine, Ser: 0.46 mg/dL (ref 0.44–1.00)
GFR calc Af Amer: >60 mL/min (ref >60)
GFR calc non Af Amer: >60 mL/min (ref >60)
Glucose, Bld: 93 mg/dL (ref 65–99)
POTASSIUM: 3.8 mmol/L (ref 3.5–5.1)
SODIUM: 123 mmol/L — AB (ref 135–145)
TOTAL PROTEIN: 4.9 g/dL — AB (ref 6.5–8.1)

## 2016-09-09 LAB — VITAMIN D 25 HYDROXY (VIT D DEFICIENCY, FRACTURES): Vit D, 25-Hydroxy: 20.8 ng/mL — ABNORMAL LOW (ref 30.0–100.0)

## 2016-09-09 LAB — CALCIUM, IONIZED: CALCIUM, IONIZED, SERUM: 3.8 mg/dL — AB (ref 4.5–5.6)

## 2016-09-09 MED ORDER — CALCIUM CARBONATE-VITAMIN D 500-200 MG-UNIT PO TABS
2.0000 | ORAL_TABLET | Freq: Two times a day (BID) | ORAL | Status: DC
  Administered 2016-09-09 – 2016-09-11 (×5): 2 via ORAL
  Filled 2016-09-09 (×5): qty 2

## 2016-09-09 MED ORDER — SODIUM CHLORIDE 0.9 % IV SOLN
2.0000 g | Freq: Once | INTRAVENOUS | Status: AC
  Administered 2016-09-09: 2 g via INTRAVENOUS
  Filled 2016-09-09: qty 20

## 2016-09-09 MED ORDER — BISACODYL 10 MG RE SUPP
10.0000 mg | Freq: Once | RECTAL | Status: AC
  Administered 2016-09-09: 10 mg via RECTAL
  Filled 2016-09-09 (×2): qty 1

## 2016-09-09 MED ORDER — KETOROLAC TROMETHAMINE 10 MG PO TABS
10.0000 mg | ORAL_TABLET | Freq: Four times a day (QID) | ORAL | Status: DC
  Administered 2016-09-09 – 2016-09-11 (×8): 10 mg via ORAL
  Filled 2016-09-09 (×10): qty 1

## 2016-09-09 MED ORDER — TRAMADOL HCL 50 MG PO TABS
100.0000 mg | ORAL_TABLET | Freq: Four times a day (QID) | ORAL | Status: DC
  Administered 2016-09-09 – 2016-09-11 (×9): 100 mg via ORAL
  Filled 2016-09-09 (×9): qty 2

## 2016-09-09 NOTE — Progress Notes (Signed)
[name] [name]   DOB:07-Mar-1961   CN#:[id number]   OQH#:[id number]  Subjective:  Pain is moderately well controlled but can "jump to an 8" quickly; ambulated x 1; not passing much gas and no BM; nauseated, no vomiting; no appetite; very anxious about drainage plans; husband in room   Objective: middle aged White woman examined in recliner  Vitals:   09/08/16 2156 09/09/16 0502  BP: 129/82 137/69  Pulse: 97 94  Resp: 16 16  Temp: 97.6 F (36.4 C) 97.9 F (36.6 C)    Body mass index is 27.46 kg/m.  Intake/Output Summary (Last 24 hours) at 09/09/16 0817 Last data filed at 09/09/16 0636  Gross per 24 hour  Intake          2803.75 ml  Output             1400 ml  Net          1403.75 ml     Oropharynx clear, slightly dry No cervical or supraclavicular adenopathy Lungs no rales or rhonchi Heart regular rate and rhythm Abd diminished BS, mildly tender to palpation Neuro: nonfocal, well oriented, appropriate affect Breasts: Deferred  .  CBG (last 3)  No results for input(s): GLUCAP in the last 72 hours.   Labs:  Lab Results  Component Value Date   WBC 9.6 09/06/2016   HGB 10.4 (L) 09/06/2016   HCT 29.7 (L) 09/06/2016   MCV 99.3 09/06/2016   PLT 473 (H) 09/06/2016   NEUTROABS 7.9 (H) 09/06/2016    @LASTCHEMISTRY @  Urine Studies No results for input(s): UHGB, CRYS in the last 72 hours.  Invalid input(s): UACOL, UAPR, USPG, UPH, UTP, UGL, UKET, UBIL, UNIT, UROB, ULEU, UEPI, UWBC, URBC, UBAC, CAST, [place], [place]  Basic Metabolic Panel:  Recent Labs Lab 09/05/16 1156  09/06/16 0607 09/07/16 0346 09/08/16 0405 09/08/16 0923 09/09/16 0536  NA 132*  --  132* 128* 126*  --  123*  K 3.3*  < > 3.3* 3.4* 3.7  --  3.8  CL  --   --  98* 95* 97*  --  94*  CO2 26  --  26 23 22   --  20*  GLUCOSE 79  --  105* 111* 95  --  93  BUN 17.0  --  11 11 13   --  13  CREATININE 0.8  --  <0.30* <0.30* <0.30*  --  0.46  CALCIUM 9.1  --  8.0* 7.4* 6.4*  --  6.2*  MG  --   --    --   --   --  1.7  --   < > = values in this interval not displayed. GFR Estimated Creatinine Clearance: 76.7 mL/min (by C-G formula based on SCr of 0.46 mg/dL). Liver Function Tests:  Recent Labs Lab 09/05/16 1156 09/06/16 0607 09/07/16 0346 09/08/16 0405 09/09/16 0536  AST 439* 331* 254* 157* 156*  ALT 719* 550* 489* 312* 254*  ALKPHOS 899* 766* 724* 530* 401*  BILITOT 19.40* 18.4* 19.2* 16.2* 15.4*  PROT 5.8* 5.3* 5.5* 5.0* 4.9*  ALBUMIN 2.7* 2.4* 2.7* 2.2* 2.0*   No results for input(s): LIPASE, AMYLASE in the last 168 hours. No results for input(s): AMMONIA in the last 168 hours. Coagulation profile  Recent Labs Lab 09/05/16 1148  INR 1.30*  PROTIME 15.6*    CBC:  Recent Labs Lab 09/02/16 1127 09/05/16 1148 09/06/16 0607  WBC 12.5* 11.8* 9.6  NEUTROABS 10.6* 9.6* 7.9*  HGB 11.7 11.7 10.4*  HCT  33.1* 34.1* 29.7*  MCV 100.0 101.2* 99.3  PLT 477* 469* 473*   Cardiac Enzymes: No results for input(s): CKTOTAL, CKMB, CKMBINDEX, TROPONINI in the last 168 hours. BNP: Invalid input(s): POCBNP CBG: No results for input(s): GLUCAP in the last 168 hours. D-Dimer No results for input(s): DDIMER in the last 72 hours. Hgb A1c No results for input(s): HGBA1C in the last 72 hours. Lipid Profile No results for input(s): CHOL, HDL, LDLCALC, TRIG, CHOLHDL, LDLDIRECT in the last 72 hours. Thyroid function studies No results for input(s): TSH, T4TOTAL, T3FREE, THYROIDAB in the last 72 hours.  Invalid input(s): FREET3 Anemia work up No results for input(s): VITAMINB12, FOLATE, FERRITIN, TIBC, IRON, RETICCTPCT in the last 72 hours. Microbiology Recent Results (from the past 240 hour(s))  TECHNOLOGIST REVIEW     Status: None   Collection Time: 09/05/16 11:48 AM  Result Value Ref Range Status   Technologist Review   Final    Few Metas and Myelocytes present, mod target cells       Studies:  Nm Hepatobiliary Liver Func  Result Date: 08/26/2016 CLINICAL DATA:   Right upper quadrant pain EXAM: NUCLEAR MEDICINE HEPATOBILIARY IMAGING VIEWS: Anterior right upper quadrant RADIOPHARMACEUTICALS:  7.9 mCi Tc-98m Choletec IV COMPARISON:  Ultrasound right upper quadrant Aug 15, 2016 FINDINGS: Images were obtained serially over a 3 hour time span. Morphine was not administered due to history of allergic reaction to morphine. Liver uptake of radiotracer is inhomogeneous consistent with known liver metastases. Over a 3 hour time span, there is no appreciable visualization of either gallbladder or small bowel. IMPRESSION: Nonvisualization of gallbladder over a 3 hour time span is suggestive of cystic duct obstruction/ acute cholecystitis. Note, however, that there is also nonvisualization of small bowel after 3 hours. This finding potentially could be indicative of common bile duct obstruction. However, this finding also could be indicative of hepatic stasis phenomenon. Serum bilirubin is moderately elevated at this time. Known liver metastases demonstrated by inhomogeneous uptake of radiotracer. Given lack of visualization of both gallbladder and small bowel, it may be prudent to consider MRCP to assess for possible common bile duct lesion as the cause for lack of small bowel visualization on this study. Electronically Signed   By: WLowella GripIII M.D.   On: 08/26/2016 16:13   UKoreaAbdomen Complete  Result Date: 08/26/2016 CLINICAL DATA:  RIGHT upper quadrant pain beginning yesterday. Jaundice. History of metastatic breast cancer with known liver metastasis. EXAM: ABDOMEN ULTRASOUND COMPLETE COMPARISON:  Abdominal ultrasound Aug 15, 2016 and MRI of the abdomen July 24, 2016 FINDINGS: Gallbladder: 1.9 cm echogenic gallstone with acoustic shadowing surrounded by mildly echogenic sludge with increased through transmission. Mild gallbladder wall thickening without pericholecystic fluid. Sonographic Murphy's sign elicited. Common bile duct: Diameter: 5 mm Liver: Multiple  heterogeneous liver mass is consistent with patient's known metastasis. Porta hepatis lymphadenopathy present though, better characterized on prior MRI. IVC: No abnormality visualized. Pancreas: Visualized portion unremarkable. Spleen: Size and appearance within normal limits. Right Kidney: Length: 11.5 cm. Echogenicity within normal limits. No mass or hydronephrosis visualized. Left Kidney: Length: 11.8 cm. Echogenicity within normal limits. No mass or hydronephrosis visualized. Abdominal aorta: No aneurysm visualized. Other findings: None. IMPRESSION: Cholelithiasis and sonographic findings of acute cholecystitis. Numerous hepatic and nodal metastasis. Electronically Signed   By: CElon AlasM.D.   On: 08/26/2016 06:36   Mr 3d Recon At Scanner  Result Date: 08/29/2016 CLINICAL DATA:  Metastatic breast cancer with liver metastasis. Evaluate for metastasis  versus biliary obstruction. New chemotherapy. Jaundice. EXAM: MRI ABDOMEN WITHOUT AND WITH CONTRAST (INCLUDING MRCP) TECHNIQUE: Multiplanar multisequence MR imaging of the abdomen was performed both before and after the administration of intravenous contrast. Heavily T2-weighted images of the biliary and pancreatic ducts were obtained, and three-dimensional MRCP images were rendered by post processing. CONTRAST:  64m MULTIHANCE GADOBENATE DIMEGLUMINE 529 MG/ML IV SOLN COMPARISON:  Ultrasound 08/26/2016.  Most recent MRI of 07/24/2016. FINDINGS: Portions of exam are minimally motion degraded. Lower chest: Mild cardiomegaly. Left-sided pleural thickening remains. Index enhancing left pleural implant measures 1.4 cm on image 19/ series 11001. Compare 1.5 cm on the prior exam. More anterior implant measures 1.5 cm on image 20/ series 1101 compare 1.4 cm on the prior. Hepatobiliary: Extensive hepatic metastasis. Overall mild to (given short interval ) moderate progression. Example central right hepatic lobe lesion at 3.3 x 3.8 cm on image 47/ series 11001.  Compare 2.8 x 3.5 cm on the prior. Lateral segment left liver lobe lesion measures 3.1 cm on image 49/ series 11001. Compare 3.0 cm on the prior. A pericholecystic right liver lobe lesion measures 4.7 x 4.0 cm on image 60/ series 11001. Compare 3.8 x 2.9 cm on the prior. Interval development of moderate intrahepatic biliary duct dilatation bilaterally. This continues to the level of the porta hepatis. Example image 89/series 4 and image 80/series 400. Amorphous soft tissue signal in this region is likely the cause, secondary to extension of porta hepatis infiltrative adenopathy. Example image 31/series 3 and image 49/ series 11002. A gallstone is again identified. There is moderate gallbladder wall thickening on image 36/series 3. The distal common duct is normal in caliber. Pancreas:  Normal, without mass or ductal dilatation. Spleen:  Normal in size, without focal abnormality. Adrenals/Urinary Tract: Normal adrenal glands. Normal kidneys, without hydronephrosis. Stomach/Bowel: Normal stomach and abdominal bowel loops. Vascular/Lymphatic: Aortic atherosclerosis. Circumaortic left renal vein. Porta hepatis adenopathy is progressive as detailed above. Example component in the portal caval space at 1.6 cm on image 31/series 3. Compare 1.4 cm on the prior. Progressive retroperitoneal adenopathy. A new node in the aortocaval space measures 1.3 cm on image 39/series 3. Other:  Trace perihepatic ascites. Musculoskeletal: Re- demonstration of osseous metastasis. Example lesion with the T11 vertebral body which measures 1.5 cm on image 22/series 12. Compare 1.4 cm on the prior. The inferior L3 lesion measures 2.8 x 1.6 cm on image 28/series 12 versus 2.9 x 1.8 cm on the prior. IMPRESSION: 1. Progression of hepatic metastasis. 2. Progressive adenopathy within the porta hepatis with increased retroperitoneal nodal metastasis. 3. Development of moderate intrahepatic biliary duct dilatation, secondary to soft tissue fullness in  the porta hepatis, favored to related to infiltrative progressive adenopathy. Normal caliber common duct distally. 4. Cholelithiasis. Gallbladder wall thickening is nonspecific. Please see prior ultrasound. 5. Similar osseous and left pleural metastasis. Findings were reported to Dr. DLoletha Carrowat 1:35 p.m. Electronically Signed   By: KAbigail MiyamotoM.D.   On: 08/29/2016 13:36   Ir Fluoro Guide Cv Line Left  Result Date: 09/05/2016 INDICATION: Metastatic breast cancer to liver, poor venous access; request made for central venous access . EXAM: LEFT UPPER EXTREMITY PICC LINE PLACEMENT WITH ULTRASOUND AND FLUOROSCOPIC GUIDANCE MEDICATIONS: None ANESTHESIA/SEDATION: None FLUOROSCOPY TIME:  Fluoroscopy Time:  42 seconds COMPLICATIONS: None immediate. PROCEDURE: The patient was advised of the possible risks and complications and agreed to undergo the procedure. The patient was then brought to the angiographic suite for the procedure. The left arm was  prepped with chlorhexidine, draped in the usual sterile fashion using maximum barrier technique (cap and mask, sterile gown, sterile gloves, large sterile sheet, hand hygiene and cutaneous antisepsis) and infiltrated locally with 1% Lidocaine. Ultrasound demonstrated patency of the left brachial vein, and this was documented with an image. Under real-time ultrasound guidance, this vein was accessed with a 21 gauge micropuncture needle and image documentation was performed. A 0.018 wire was introduced in to the vein. Over this, a 5 [place] double lumen power injectable PICC was advanced to the lower SVC/right atrial junction. Fluoroscopy during the procedure and fluoro spot radiograph confirms appropriate catheter position. The catheter was flushed and covered with a sterile dressing. Catheter length:  41 cm IMPRESSION: Successful left arm power PICC line placement with ultrasound and fluoroscopic guidance. The catheter is ready for use. Read by: Rowe Robert, PA-C Electronically  Signed   By: Markus Daft M.D.   On: 09/05/2016 16:00   Ir US Guide Vasc Access Left  Result Date: 09/05/2016 INDICATION: Metastatic breast cancer to liver, poor venous access; request made for central venous access . EXAM: LEFT UPPER EXTREMITY PICC LINE PLACEMENT WITH ULTRASOUND AND FLUOROSCOPIC GUIDANCE MEDICATIONS: None ANESTHESIA/SEDATION: None FLUOROSCOPY TIME:  Fluoroscopy Time:  42 seconds COMPLICATIONS: None immediate. PROCEDURE: The patient was advised of the possible risks and complications and agreed to undergo the procedure. The patient was then brought to the angiographic suite for the procedure. The left arm was prepped with chlorhexidine, draped in the usual sterile fashion using maximum barrier technique (cap and mask, sterile gown, sterile gloves, large sterile sheet, hand hygiene and cutaneous antisepsis) and infiltrated locally with 1% Lidocaine. Ultrasound demonstrated patency of the left brachial vein, and this was documented with an image. Under real-time ultrasound guidance, this vein was accessed with a 21 gauge micropuncture needle and image documentation was performed. A 0.018 wire was introduced in to the vein. Over this, a 5 [place] double lumen power injectable PICC was advanced to the lower SVC/right atrial junction. Fluoroscopy during the procedure and fluoro spot radiograph confirms appropriate catheter position. The catheter was flushed and covered with a sterile dressing. Catheter length:  41 cm IMPRESSION: Successful left arm power PICC line placement with ultrasound and fluoroscopic guidance. The catheter is ready for use. Read by: Rowe Robert, PA-C Electronically Signed   By: Markus Daft M.D.   On: 09/05/2016 16:00   Mr Abdomen Mrcp W Wo Contast  Result Date: 08/29/2016 CLINICAL DATA:  Metastatic breast cancer with liver metastasis. Evaluate for metastasis versus biliary obstruction. New chemotherapy. Jaundice. EXAM: MRI ABDOMEN WITHOUT AND WITH CONTRAST (INCLUDING MRCP)  TECHNIQUE: Multiplanar multisequence MR imaging of the abdomen was performed both before and after the administration of intravenous contrast. Heavily T2-weighted images of the biliary and pancreatic ducts were obtained, and three-dimensional MRCP images were rendered by post processing. CONTRAST:  51m MULTIHANCE GADOBENATE DIMEGLUMINE 529 MG/ML IV SOLN COMPARISON:  Ultrasound 08/26/2016.  Most recent MRI of 07/24/2016. FINDINGS: Portions of exam are minimally motion degraded. Lower chest: Mild cardiomegaly. Left-sided pleural thickening remains. Index enhancing left pleural implant measures 1.4 cm on image 19/ series 11001. Compare 1.5 cm on the prior exam. More anterior implant measures 1.5 cm on image 20/ series 1101 compare 1.4 cm on the prior. Hepatobiliary: Extensive hepatic metastasis. Overall mild to (given short interval ) moderate progression. Example central right hepatic lobe lesion at 3.3 x 3.8 cm on image 47/ series 11001. Compare 2.8 x 3.5 cm on the prior. Lateral segment  left liver lobe lesion measures 3.1 cm on image 49/ series 11001. Compare 3.0 cm on the prior. A pericholecystic right liver lobe lesion measures 4.7 x 4.0 cm on image 60/ series 11001. Compare 3.8 x 2.9 cm on the prior. Interval development of moderate intrahepatic biliary duct dilatation bilaterally. This continues to the level of the porta hepatis. Example image 89/series 4 and image 80/series 400. Amorphous soft tissue signal in this region is likely the cause, secondary to extension of porta hepatis infiltrative adenopathy. Example image 31/series 3 and image 49/ series 11002. A gallstone is again identified. There is moderate gallbladder wall thickening on image 36/series 3. The distal common duct is normal in caliber. Pancreas:  Normal, without mass or ductal dilatation. Spleen:  Normal in size, without focal abnormality. Adrenals/Urinary Tract: Normal adrenal glands. Normal kidneys, without hydronephrosis. Stomach/Bowel:  Normal stomach and abdominal bowel loops. Vascular/Lymphatic: Aortic atherosclerosis. Circumaortic left renal vein. Porta hepatis adenopathy is progressive as detailed above. Example component in the portal caval space at 1.6 cm on image 31/series 3. Compare 1.4 cm on the prior. Progressive retroperitoneal adenopathy. A new node in the aortocaval space measures 1.3 cm on image 39/series 3. Other:  Trace perihepatic ascites. Musculoskeletal: Re- demonstration of osseous metastasis. Example lesion with the T11 vertebral body which measures 1.5 cm on image 22/series 12. Compare 1.4 cm on the prior. The inferior L3 lesion measures 2.8 x 1.6 cm on image 28/series 12 versus 2.9 x 1.8 cm on the prior. IMPRESSION: 1. Progression of hepatic metastasis. 2. Progressive adenopathy within the porta hepatis with increased retroperitoneal nodal metastasis. 3. Development of moderate intrahepatic biliary duct dilatation, secondary to soft tissue fullness in the porta hepatis, favored to related to infiltrative progressive adenopathy. Normal caliber common duct distally. 4. Cholelithiasis. Gallbladder wall thickening is nonspecific. Please see prior ultrasound. 5. Similar osseous and left pleural metastasis. Findings were reported to Dr. Loletha Carrow at 1:35 p.m. Electronically Signed   By: Abigail Miyamoto M.D.   On: 08/29/2016 13:36   Ir Int Lianne Cure Biliary Drain With Cholangiogram  Result Date: 09/06/2016 INDICATION: History of metastatic breast cancer, now with malignant obstructive jaundice. Request made for placement of percutaneous biliary drainage catheter in hopes of optimizing liver function tests to improve patient's candidacy for potential Y 90 or additional systemic therapies. EXAM: ULTRASOUND AND FLUOROSCOPIC GUIDED PERCUTANEOUS TRANSHEPATIC CHOLANGIOGRAM AND BILIARY TUBE PLACEMENT COMPARISON:  CT abdomen pelvis - 09/05/2016; abdominal MRI - 08/29/2016 MEDICATIONS: Zosyn 3.375 g IV ; the antibiotic was administered with an  appropriate time frame prior to the initiation of the procedure. CONTRAST:  15m ISOVUE-300 IOPAMIDOL (ISOVUE-300) INJECTION 61% - administered into the biliary tree ANESTHESIA/SEDATION: Moderate (conscious) sedation was employed during this procedure. A total of Versed 5 mg and Fentanyl 250 mcg was administered intravenously. Moderate Sedation Time: 57 minutes. The patient's level of consciousness and vital signs were monitored continuously by radiology nursing throughout the procedure under my direct supervision. FLUOROSCOPY TIME:  10 minutes 54 seconds (2915mGy) COMPLICATIONS: None immediate. TECHNIQUE: Informed written consent was obtained from the patient after a discussion of the risks, benefits and alternatives to treatment. Questions regarding the procedure were encouraged and answered. A timeout was performed prior to the initiation of the procedure. The midline of the upper abdominal quadrant was prepped and draped in the usual sterile fashion, and a sterile drape was applied covering the operative field. Maximum barrier sterile technique with sterile gowns and gloves were used for the procedure. A timeout was performed  prior to the initiation of the procedure. Under direct ultrasound guidance, a dilated duct within the peripheral aspect of the left lobe of the liver was accessed with a [place] needle. Contrast injection confirmed appropriate positioning. A Nitrex wire was advanced to the level of the malignant obstruction at the level the biliary hilum. Despite prolonged efforts, the inner 3 French catheter from the [place] set could not be advanced into the biliary tree given the cranial location of the left lobe of the liver as well as the angulation of the accessed duct. As such, the more peripheral aspect of the dominant dilated duct within the left lobe of the liver was accessed with an additional [place] needle. A Nitrex wire was advanced to the level of the malignant obstruction at  the level of the biliary hilum. Again, there is difficulty advancing the inner 3 French catheter from the [place] set and as such, a stiff micro puncture sheath sheath was utilized to secure the access in to biliary tree and allowed for placement of an Amplatz wire which was coiled within the biliary hilum. Under intermittent fluoroscopic guidance, the micropuncture sheath was exchanged for a a 4 French angled glide catheter and ultimately, a Kumpe catheter, which was utilized to manipulate the Amplatz wire through the common bile duct to the level of the duodenum. Contrast injection confirmed appropriate positioning. The Kumpe catheter was utilized for measurement purposes. Additional sideholes were cut approximately 2 cm peripheral to the radiopaque marker of an 8 French percutaneous biliary drainage catheter. Under intermittent fluoroscopic guidance, an 8 French percutaneous biliary drainage catheter was advanced over an Amplatz wire with tip ultimately coiled within the descending duodenum. Contrast injection was performed demonstrating appropriate positioning and functionality of the biliary drainage catheter. Multiple spot fluoroscopic radiographic image were obtained in various obliquities. The catheter was connected to a drainage bag which yielded the brisk return of clear bile. The catheter was secured to the skin with an interrupted suture. The patient tolerated the procedure well without immediate postprocedural complication. FINDINGS: Ultrasound scanning demonstrates several hypoechoic lesions within the left lobe of liver compatible with known hepatic metastatic disease. Moderate intrahepatic biliary duct dilatation was again demonstrated within the left lobe of the liver Ultimately, under direct ultrasound guidance, a peripherally dilated duct in the left lobe of the liver was accessed allowing placement of a 8 French percutaneous biliary drainage catheter with and coiled and locked within the  duodenum. Note, an 8 [place] percutaneous drain was placed in lieu of a 10 [place] as there was difficulty advancing the [place] set due to the cranial location of the left lobe of the liver and required angulation of the access bile duct. IMPRESSION: Successful placement of an 8 French percutaneous biliary drainage catheter with end coiled and locked within the duodenum. PLAN: - Patient will be followed with daily CMP levels in hopes of improvement in serum bilirubin levels. - Pending the trend of the patient's LFTs, the patient will likely return for repeat cholangiogram and potential biliary drainage catheter exchange and up sizing with potential capping later this week. - Ultimately, the patient's bilirubin will need to improve 2 to be a candidate for Y 90 Radioembolization. Electronically Signed   By: Sandi Mariscal M.D.   On: 09/06/2016 11:15   Ct Angio Abd/pel W/ And/or W/o  Result Date: 09/05/2016 CLINICAL DATA:  History of metastatic breast cancer with progressive hepatic metastatic disease. Please perform CTA for evaluation of candidacy for Y 90 radioembolization.  EXAM: CTA ABDOMEN AND PELVIS WITH CONTRAST TECHNIQUE: Multidetector CT imaging of the abdomen and pelvis was performed using the standard protocol during bolus administration of intravenous contrast. Multiplanar reconstructed images and MIPs were obtained and reviewed to evaluate the vascular anatomy. CONTRAST:  100 cc Isovue 370 COMPARISON:  Abdominal MRI - 08/29/2016 ; 07/24/2016; PET-CT -05/14/2016 FINDINGS: VASCULAR Aorta: Minimal amount of atherosclerotic plaque within a normal caliber abdominal aorta, not resulting in hemodynamically significant stenosis. No abdominal aortic dissection or periaortic stranding. Celiac: Widely patent. Conventional takeoff of the GDA. Note is made of separate origins of the left lateral hepatic segmental artery with middle hepatic artery which supplies the medial segment of the left lobe of the liver as well  as a portion of the anterior segment of the right lobe of the liver. Otherwise, conventional branching pattern. The right gastric artery is not definitely identified. SMA: Widely patent.  Conventional branching pattern. Renals: Solitary bilaterally. The bilateral renal arteries are widely patent without hemodynamically significant narrowing. No vessel irregularity to suggest FMD. IMA: Widely patent. Inflow: The bilateral common, external and internal iliac artery is are tortuous lobe normal caliber and widely patent without hemodynamically significant stenosis. Proximal Outflow: The bilateral common, superficial and deep femoral arteries appear widely patent throughout their imaged course. Veins: The IVC and pelvic venous system appears widely patent. Review of the MIP images confirms the above findings. NON-VASCULAR Lower chest: Limited visualization of the lower thorax demonstrates consolidative opacities within the imaged left lower lobe with associated volume loss and trace left-sided pleural effusion. Known hypermetabolic nodule within the medial basilar aspect the left pleural space is grossly unchanged measuring 1.5 cm in diameter (image 8, series 5). No new focal airspace opacities. Normal heart size.  No pericardial effusion. Hepatobiliary: Normal hepatic contour, however loculated fluid/bile is noted about the subcapsular aspect of the right lobe of the liver (image 29, series 16), similar to recent abdominal MRI. Re- demonstrated extensive hepatic metastatic disease, similar to recently obtained abdominal MRI with dominant lesion within the dome of the right lobe of liver measuring approximately 2.6 x 2.6 cm (image 24, series 6 dominant lesion within the subcapsular aspect the right lobe of the liver measuring approximately 3.0 x 3.7 cm (image 42, series 6) and dominant lesion within the subcapsular aspect the right lobe of the liver measuring approximately 7.5 x 4.2 cm (image 66). Re- demonstrated  intrahepatic biliary duct dilatation with occlusion of the biliary system at the level the hilum. The main portal vein is tapered/narrowed at the level of the biliary hilum though needs patent. A laminated approximately 1.6 cm gallstone is noted within the fundus of the gallbladder (image 86, series 4) with associated minimal amount of pericholecystic stranding. Pancreas: Normal appearance of the pancreas Spleen: Normal appearance of the spleen Adrenals/Urinary Tract: There is symmetric enhancement and excretion of the bilateral kidneys. No definite renal stones this postcontrast examination. No discrete renal lesions. No urine obstruction or perinephric stranding. Normal appearance the bilateral adrenal glands. Normal appearance of the urinary bladder given degree distention. Stomach/Bowel: Scattered colonic diverticulosis without evidence of diverticulitis. Normal appearance of the terminal ileum. The appendix is not visualized, however there is no pericecal inflammatory change. No pneumoperitoneum, pneumatosis or portal venous gas. Lymphatic: Compared to PET-CT performed 05/14/2016, there has been development of porta hepatis and retroperitoneal lymphadenopathy with index left-sided periaortic lymph node measuring 1.3 cm in greatest short axis diameter (image 70, series 4), index aortocaval lymph nodes measuring 1.2 cm (image 79) and  1.1 cm (image 100) and index gastrohepatic lymph ligament lymph node measuring 0.9 cm (image 46, series 4 Reproductive: Normal appearance of the pelvic organs. Small amount of free fluid in the pelvic cul-de-sac. Other: Regional soft tissues appear normal. Musculoskeletal: Moderate scoliotic curvature of the thoracolumbar spine with dominant caudal component convex to the left measuring approximately 30 degrees (as measured from the superior endplate of A12 to the inferior endplate of L4. Similar appearance of known pathologic fracture involving the right inferior pubic ramus (image  205, series 4). Approximately 1.3 x 1.5 cm sclerotic lesion within the right-side of the T11 vertebral body is unchanged (image 15, series 16). Potential new approximately 1.8 x 1.7 cm lytic lesion involving the posterior aspects of the L3 vertebral body (sagittal image 115, series 9). IMPRESSION: VASCULAR 1. Minimal amount of atherosclerotic plaque with a normal caliber abdominal aorta. Aortic Atherosclerosis (ICD10-I70.0). 2. Conventional anatomy amenable to Y 90 radioembolization. NON-VASCULAR 1. Compared to recent obtain abdominal MRI, unchanged extensive hepatic metastatic disease, primarily affecting the right lobe of the liver with obstruction of the biliary system at the level the hilum with associated intrahepatic biliary duct dilatation affecting both the right and left lobes of the liver. 2. Compared to PET-CT performed 05/14/2016, interval development of porta hepatis and retroperitoneal lymphadenopathy as detailed above. 3. Grossly unchanged metastatic disease within the imaged left lower lung / pleural as detailed above. 4. Potential new approximately 1.8 cm lytic lesion involving the posterior aspect of the L3 vertebral body. Further evaluation lumbar spine MRI could performed as clinically indicated. Electronically Signed   By: Sandi Mariscal M.D.   On: 09/05/2016 16:57   US Abdomen Limited Ruq  Result Date: 08/15/2016 CLINICAL DATA:  Acute right upper quadrant pain, metastatic breast cancer to the liver EXAM: US ABDOMEN LIMITED - RIGHT UPPER QUADRANT COMPARISON:  07/24/2016 FINDINGS: Gallbladder: Echogenic shadowing gallstone noted measuring 1.7 cm. Normal wall thickness measuring 2.4 mm. No Murphy's sign. No pericholecystic fluid. Negative for cholecystitis by ultrasound. Common bile duct: Diameter: 4.6 mm Liver: Numerous solid hepatic metastases again evident, largest lesions are in the right lobe measuring up to 7.8 cm in diameter. No biliary dilatation or obstruction.  No ascites. IMPRESSION:  Cholelithiasis. Negative for acute cholecystitis or biliary obstruction Extensive hepatic metastases Electronically Signed   By: Jerilynn Mages.  Shick M.D.   On: 08/15/2016 11:22    Assessment: 57 y.o. BRCA negative [place] woman with stage IV breast cancer, history as follows  (1)  S/p Right upper inner quadrant lumpectomy and sentinel lymph node sampling 03/15/2004 for a pT1c pN0. Stage IA invasive ductal carcinoma, grade 2, estrogen receptor 95% positive, progesterone receptor 65% positive, HER-2 not amplified; additional surgery 04/25/2004 for seroma or clearance showed no residual tumor  (2) adjuvant chemotherapy with cyclophosphamide and doxorubicin every 21 days x4 completed 07/19/2004  (3) adjuvant radiation given under Dr. Donella Stade in [place] completed July 2006  (4) the patient opted against adjuvant antiestrogen therapy  (5) genetics testing showed no BRCA mutations  (6) biopsy of a palpable right axillary mass 10/24/2009 showed invasive ductal carcinoma, grade 3, estrogen receptor 100% positive, progesterone receptor 2% positive (alert score 5) HER-2 negative; no evidence of systemic disease on PET scanning  (7) completed 3 of 4 planned cycles of docetaxel and cyclophosphamide September 2011, fourth cycle omitted because of marked elevations in liver function tests  (8) an right axillary lymph node dissection 03/06/2010 showed 3/8 lymph nodes removed to be involved by tumor, with extracapsular extension.  (  9) 45 Gy radiation to the right axillary and right supraclavicular nodal areas, with capecitabine sensitization, completed March 2012   (10) intolerant of letrozole and exemestane; on tamoxifen with interruptions September 2012 to March 2013, but then continuing on tamoxifen more continuously through March of 2015  (11) biopsy of mediastinal adenopathy 06/02/2013 shows invasive ductal carcinoma (gross cystic disease fluid protein positive, TTS-1 negative), estrogen receptor  80% positive, progesterone receptor 2% positive, HER-2 not amplified  (12) letrozole started March 2015-- tolerated with significant side effects, discontinued at the end of May 2015  (13) PET scan 08/16/2013 shows extensive left pleural metastatic disease and a large left pleural effusion that shifts cardiac and mediastinal structures to the right; adenopathy (celiac trunk, periadrenal, periaortic); and a left medial clavicular lesion; Status post left thoracentesis 08/16/2013 positive for adenocarcinoma, estrogen receptor positive, progesterone receptor negative.  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 mutation; everolimus was suggested as an available targeted agent  (18) exemestane started 03/31/2014, discontinued 10/31/2014 with evidence of progression             (a) everolimus added 04/03/2014 but not tolerated (cytopenias, elevated LFTs) even at minimal doses; stopped 04/17/2014  (19) fulvestrant started 12/20/2014             (a) palbociclib added at very low dose 04/03/2015 (starting dose 75 mg weekly)             (b) palbociclib dose gradually increased to 75 mg daily, 21/7, as of May 2017             (c) palbociclib dose increased to 100 mg daily, 21/ 7, beginning November mid- cycle             (d) palbociclib dose decreased to 75 mg daily beginning with cycle starting 05/25/2016             (e) letrozole 2.5 mg started 05/26/2016  (20) liver biopsy 03/20/2015 confirms metastatic carcinoma, still  estrogen receptor positive at 100%, progesterone receptor negative, HER-2 equivocal with a signals ratio 1.41, number per cell 4.50.              (a) repeat liver biopsy December 2017 might show further changes (HER-2 positivity)  (21) immunohistochemistry for mismatched repair protein mutations 03/20/2015 showed normal major and minor MMR proteins, with a very low probability of microsatellite instability ([id number])  (22) adjuvant radiation 12/24/15-01/02/16 Site/dose:1) Left T9 Rib / 24 Gy in 8 fx 2) Right inferior pelvis/ 24 Gy in 8 fx  (23) consider pembrolizumab (obtained compassionate release from company)             (a) liver biopsy 03/20/2015 negative for PD-1   (24) 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             (a) s/p L hepatic approach transhepatic 8 Fr biliary drainage catheter placed 09/06/2016 with end coiled and locked within duodenum.   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 treated with Lovenox for 5-6 months  (c) right upper extremity lymphedema--receiving physical therapy  (d) hepatic steatosis with chronically elevated LFTs as well as unusual hepatic sensitivity to chemotherapy  (e) osteopenia with the lowest T score -1.6 on bone density scan 06/20/2013             (i) on denosumab/ Xgeva Q28d  (f) radiation oncology (Dr Valere Dross) has reviewed prior radiation records in case there is further mediastinal involvement with dysphagia etc in which case palliative XRT could be considered             (a) radiation to left mediastinum/ left 7th rib 3250 cGy in 13 sessions04/18/2016 through 08/02/2014             (b) radiation to T11 area: 22 Gy in 7 sessions, last dose 11/27/2014             (c) radiation left parietal scalp region to be completed 12/22/2014             (d)  radiation to sacral area completed 04/09/2015             (e) radiation to right inferior pelvis and left ninth rib (24 gray, 12/24/2015--01/02/2016)             (f) T11 was treated stereotactically with 14 gray in 1 fraction 03/12/2016.  (g) chest wall and perineal pain--improved post radiation treatments             (a) discussed celebrex/ carafate but holding off for now  (h) zoster diagnosed 04/04/2015-- on valacyclovir--resolved   PLAN: Discussed discharge plans-- she will "pretend to be at home" today and if she could have managed at home can d/c tomorrow; if not it will be later. She understands the t bil is falling but very slowly--it will remain abnormal at discharge. We also discussed fluid restriction, as her Na continues to drop. Her Ca also did not respond to IV replacement yesterday-- she understands this is all related to liver dysfunction and may be an ongoing problem.  Today we are going to restrict free water, replace Ca, and try for a BM.   She wanted to know if it was time for a Hospice referral. She is still interested in possible trial participation, though she understands with a high bili she is not likely to be eligible. Until the plan is for no further life prolonging measures Hospice is not appropriate, but the [facility] [place] may be.   Chauncey Cruel, MD 09/09/2016  8:17 AM Medical Oncology and Hematology [facility] [address] [place], [place] [address] Tel. [phone]    Fax. [phone]

## 2016-09-09 NOTE — Progress Notes (Signed)
Referring Physician(s): Chauncey Cruel  Supervising Physician: Sandi Mariscal  Patient Status:  [facility] - In-pt  Chief Complaint: Metastatic breast cancer, biliary obstruction   Subjective: Patient sitting up in chair, has just recently ambulated in hallway. No acute changes. Notices persistent swelling in legs and? left arm.   Allergies: 2nd skin quick heal; Decadron [dexamethasone]; Dilaudid [hydromorphone]; Enoxaparin; Fluconazole; Hydromorphone hcl; Morphine and related; Ondansetron; Protonix [pantoprazole sodium]; and Tegaderm ag mesh [silver]  Medications: Prior to Admission medications   Medication Sig Start Date End Date Taking? Authorizing Provider  acetaminophen (TYLENOL) 500 MG tablet Take 1 tablet (500 mg total) by mouth 4 (four) times daily -  with meals and at bedtime. 08/30/16  Yes Gherghe, Vella Redhead, MD  B Complex-C (B-COMPLEX WITH VITAMIN C) tablet Take 1 tablet by mouth daily. Reported on 03/27/2015 01/19/15  Yes Magrinat, Virgie Dad, MD  cholecalciferol 2000 UNITS tablet Take 1 tablet (2,000 Units total) by mouth daily. 01/19/15  Yes Magrinat, Virgie Dad, MD  famotidine (PEPCID) 40 MG tablet Take 0.5-1 tablets (20-40 mg total) by mouth 2 (two) times daily. 40 mg in the morning and 20 mg in the evening Patient taking differently: Take 20 mg by mouth 2 (two) times daily.  08/30/16  Yes Gherghe, Vella Redhead, MD  folic acid (FOLVITE) 1 MG tablet Take 1 tablet (1 mg total) by mouth daily. 01/19/15  Yes Magrinat, Virgie Dad, MD  LORazepam (ATIVAN) 0.5 MG tablet Take 1 tablet (0.5 mg total) by mouth every 8 (eight) hours as needed for anxiety. 08/30/16 08/30/17 Yes Gherghe, Vella Redhead, MD  Melatonin 3 MG TABS Take 3 mg by mouth at bedtime.   Yes [provider]  naproxen (NAPROSYN) 250 MG tablet Take 1 tablet (250 mg total) by mouth 4 (four) times daily -  with meals and at bedtime. 08/30/16  Yes Gherghe, Vella Redhead, MD  saccharomyces boulardii (FLORASTOR) 250 MG capsule Take 250 mg by  mouth daily.  03/23/15  Yes Magrinat, Virgie Dad, MD  ciprofloxacin (CIPRO) 500 MG tablet Take 1 tablet (500 mg total) by mouth 2 (two) times daily. Patient not taking: Reported on 09/05/2016 08/30/16   Caren Griffins, MD  dexamethasone (DECADRON) 4 MG tablet Take 1 tablet (4 mg total) by mouth 2 (two) times daily. Patient not taking: Reported on 09/05/2016 08/30/16   Caren Griffins, MD  ketorolac (TORADOL) 10 MG tablet Take 1 tablet (10 mg total) by mouth every 6 (six) hours as needed. 08/30/16   Caren Griffins, MD  phenazopyridine (PYRIDIUM) 100 MG tablet Take 1 tablet (100 mg total) by mouth 3 (three) times daily as needed for pain. Patient not taking: Reported on 08/15/2016 05/16/16   Magrinat, Virgie Dad, MD  RABEprazole Sodium 5 MG CPSP Take 5 mg by mouth daily. Patient not taking: Reported on 09/05/2016 10/08/15   Magrinat, Virgie Dad, MD  vitamin E (VITAMIN E) 400 UNIT capsule Take 1 capsule (400 Units total) by mouth 2 (two) times daily. Patient not taking: Reported on 08/15/2016 05/16/16   Kyung Rudd, MD     Vital Signs: BP (!) 145/91 (BP Location: Left Leg)   Pulse 98   Temp 98.2 F (36.8 C) (Oral)   Resp 16   Ht 5\' 4"  (1.626 m)   Wt 160 lb (72.6 kg)   SpO2 99%   BMI 27.46 kg/m   Physical Exam awake, alert. Biliary drain intact, output 725 cc; drain flushes without difficulty; dressing clean and dry; site with  minimal tenderness to palpation. Left arm PICC site with some mild bruising ,soft to touch; fluids infusing well  Imaging: Ir Int Lianne Cure Biliary Drain With Cholangiogram  Result Date: 09/06/2016 INDICATION: History of metastatic breast cancer, now with malignant obstructive jaundice. Request made for placement of percutaneous biliary drainage catheter in hopes of optimizing liver function tests to improve patient's candidacy for potential Y 90 or additional systemic therapies. EXAM: ULTRASOUND AND FLUOROSCOPIC GUIDED PERCUTANEOUS TRANSHEPATIC CHOLANGIOGRAM AND BILIARY TUBE PLACEMENT  COMPARISON:  CT abdomen pelvis - 09/05/2016; abdominal MRI - 08/29/2016 MEDICATIONS: Zosyn 3.375 g IV ; the antibiotic was administered with an appropriate time frame prior to the initiation of the procedure. CONTRAST:  33mL ISOVUE-300 IOPAMIDOL (ISOVUE-300) INJECTION 61% - administered into the biliary tree ANESTHESIA/SEDATION: Moderate (conscious) sedation was employed during this procedure. A total of Versed 5 mg and Fentanyl 250 mcg was administered intravenously. Moderate Sedation Time: 57 minutes. The patient's level of consciousness and vital signs were monitored continuously by radiology nursing throughout the procedure under my direct supervision. FLUOROSCOPY TIME:  10 minutes 54 seconds (397 mGy) COMPLICATIONS: None immediate. TECHNIQUE: Informed written consent was obtained from the patient after a discussion of the risks, benefits and alternatives to treatment. Questions regarding the procedure were encouraged and answered. A timeout was performed prior to the initiation of the procedure. The midline of the upper abdominal quadrant was prepped and draped in the usual sterile fashion, and a sterile drape was applied covering the operative field. Maximum barrier sterile technique with sterile gowns and gloves were used for the procedure. A timeout was performed prior to the initiation of the procedure. Under direct ultrasound guidance, a dilated duct within the peripheral aspect of the left lobe of the liver was accessed with a [place] needle. Contrast injection confirmed appropriate positioning. A Nitrex wire was advanced to the level of the malignant obstruction at the level the biliary hilum. Despite prolonged efforts, the inner 3 French catheter from the [place] set could not be advanced into the biliary tree given the cranial location of the left lobe of the liver as well as the angulation of the accessed duct. As such, the more peripheral aspect of the dominant dilated duct within the left  lobe of the liver was accessed with an additional [place] needle. A Nitrex wire was advanced to the level of the malignant obstruction at the level of the biliary hilum. Again, there is difficulty advancing the inner 3 French catheter from the [place] set and as such, a stiff micro puncture sheath sheath was utilized to secure the access in to biliary tree and allowed for placement of an Amplatz wire which was coiled within the biliary hilum. Under intermittent fluoroscopic guidance, the micropuncture sheath was exchanged for a a 4 French angled glide catheter and ultimately, a [place] catheter, which was utilized to manipulate the Amplatz wire through the common bile duct to the level of the duodenum. Contrast injection confirmed appropriate positioning. The [place] catheter was utilized for measurement purposes. Additional sideholes were cut approximately 2 cm peripheral to the radiopaque marker of an 8 French percutaneous biliary drainage catheter. Under intermittent fluoroscopic guidance, an 8 French percutaneous biliary drainage catheter was advanced over an Amplatz wire with tip ultimately coiled within the descending duodenum. Contrast injection was performed demonstrating appropriate positioning and functionality of the biliary drainage catheter. Multiple spot fluoroscopic radiographic image were obtained in various obliquities. The catheter was connected to a drainage bag which yielded the brisk return  of clear bile. The catheter was secured to the skin with an interrupted suture. The patient tolerated the procedure well without immediate postprocedural complication. FINDINGS: Ultrasound scanning demonstrates several hypoechoic lesions within the left lobe of liver compatible with known hepatic metastatic disease. Moderate intrahepatic biliary duct dilatation was again demonstrated within the left lobe of the liver Ultimately, under direct ultrasound guidance, a peripherally dilated duct in the left  lobe of the liver was accessed allowing placement of a 8 French percutaneous biliary drainage catheter with and coiled and locked within the duodenum. Note, an 8 [place] percutaneous drain was placed in lieu of a 10 [place] as there was difficulty advancing the [place] set due to the cranial location of the left lobe of the liver and required angulation of the access bile duct. IMPRESSION: Successful placement of an 8 French percutaneous biliary drainage catheter with end coiled and locked within the duodenum. PLAN: - Patient will be followed with daily CMP levels in hopes of improvement in serum bilirubin levels. - Pending the trend of the patient's LFTs, the patient will likely return for repeat cholangiogram and potential biliary drainage catheter exchange and up sizing with potential capping later this week. - Ultimately, the patient's bilirubin will need to improve 2 to be a candidate for Y 90 Radioembolization. Electronically Signed   By: Sandi Mariscal M.D.   On: 09/06/2016 11:15    Labs:  CBC:  Recent Labs  08/30/16 0752 09/02/16 1127 09/05/16 1148 09/06/16 0607  WBC 7.2 12.5* 11.8* 9.6  HGB 11.9* 11.7 11.7 10.4*  HCT 32.8* 33.1* 34.1* 29.7*  PLT 514* 477* 469* 473*    COAGS:  Recent Labs  08/27/16 0404 08/28/16 0721 08/29/16 0508 09/05/16 1148  INR 1.07 1.07 1.11 1.30*    BMP:  Recent Labs  09/06/16 0607 09/07/16 0346 09/08/16 0405 09/09/16 0536  NA 132* 128* 126* 123*  K 3.3* 3.4* 3.7 3.8  CL 98* 95* 97* 94*  CO2 26 23 22  20*  GLUCOSE 105* 111* 95 93  BUN 11 11 13 13   CALCIUM 8.0* 7.4* 6.4* 6.2*  CREATININE <0.30* <0.30* <0.30* 0.46  GFRNONAA NOT CALCULATED NOT CALCULATED NOT CALCULATED >60  GFRAA NOT CALCULATED NOT CALCULATED NOT CALCULATED >60    LIVER FUNCTION TESTS:  Recent Labs  09/06/16 0607 09/07/16 0346 09/08/16 0405 09/09/16 0536  BILITOT 18.4* 19.2* 16.2* 15.4*  AST 331* 254* 157* 156*  ALT 550* 489* 312* 254*  ALKPHOS 766* 724* 530* 401*    PROT 5.3* 5.5* 5.0* 4.9*  ALBUMIN 2.4* 2.7* 2.2* 2.0*    Assessment and Plan: Patient with history of stage IV breast cancer with obstructive jaundice; status post internal/external biliary drain placement on 09/06/16; afebrile; total bilirubin 15.4(16.2), other LFTs trending down slightly; creatinine normal, potassium ok, albumin 2.0; continue current treatment for now;once total bilirubin decreases significantly then consider follow-up cholangiogram and possible biliary drain upsizing if pt desires with understanding that this may not expedite bilirubin decrease at faster rate than currently is occurring; Dr. Pascal Lux to see this afternoon.    Electronically Signed: D. Rowe Robert, PA-C 09/09/2016, 5:00 PM   I spent a total of 20 minutes at the the patient's bedside AND on the patient's hospital floor or unit, greater than 50% of which was counseling/coordinating care for biliary drain    Patient ID: [name] [name], female   DOB: [birth year], 56 y.o.   MRN: [id number]

## 2016-09-09 NOTE — Progress Notes (Signed)
Lab critical value:  Calcium - 6.2  Notified on call provider Dr.Feng ( on for Oncology)  Also relayed  Information to day shift nurse.

## 2016-09-09 NOTE — Progress Notes (Signed)
Asked to assess left upper arm PICC, arm is soft, little tender, and bruised. Complained of tightness, possible edema, encouraged to use arm. Elevated arm on two pillows. Will continue to monitor. Encouraged to let nurse know if arm continues to swell or becomes painful.

## 2016-09-10 ENCOUNTER — Other Ambulatory Visit: Payer: Self-pay

## 2016-09-10 DIAGNOSIS — R17 Unspecified jaundice: Secondary | ICD-10-CM

## 2016-09-10 DIAGNOSIS — R52 Pain, unspecified: Secondary | ICD-10-CM

## 2016-09-10 DIAGNOSIS — E46 Unspecified protein-calorie malnutrition: Secondary | ICD-10-CM

## 2016-09-10 LAB — CBC WITH DIFFERENTIAL/PLATELET
BASOS PCT: 0 %
Basophils Absolute: 0 10*3/uL (ref 0.0–0.1)
Eosinophils Absolute: 0.1 10*3/uL (ref 0.0–0.7)
Eosinophils Relative: 1 %
HEMATOCRIT: 25 % — AB (ref 36.0–46.0)
Hemoglobin: 8.6 g/dL — ABNORMAL LOW (ref 12.0–15.0)
Lymphocytes Relative: 2 %
Lymphs Abs: 0.4 10*3/uL — ABNORMAL LOW (ref 0.7–4.0)
MCH: 34 pg (ref 26.0–34.0)
MCHC: 34.4 g/dL (ref 30.0–36.0)
MCV: 98.8 fL (ref 78.0–100.0)
MONO ABS: 1.5 10*3/uL — AB (ref 0.1–1.0)
MONOS PCT: 8 %
NEUTROS ABS: 15.7 10*3/uL — AB (ref 1.7–7.7)
Neutrophils Relative %: 89 %
Platelets: 373 10*3/uL (ref 150–400)
RBC: 2.53 MIL/uL — ABNORMAL LOW (ref 3.87–5.11)
RDW: 16.9 % — AB (ref 11.5–15.5)
WBC: 17.6 10*3/uL — ABNORMAL HIGH (ref 4.0–10.5)

## 2016-09-10 LAB — COMPREHENSIVE METABOLIC PANEL
ALBUMIN: 1.8 g/dL — AB (ref 3.5–5.0)
ALK PHOS: 334 U/L — AB (ref 38–126)
ALT: 212 U/L — AB (ref 14–54)
ANION GAP: 8 (ref 5–15)
AST: 155 U/L — ABNORMAL HIGH (ref 15–41)
BUN: 12 mg/dL (ref 6–20)
CALCIUM: 6.6 mg/dL — AB (ref 8.9–10.3)
CO2: 22 mmol/L (ref 22–32)
Chloride: 97 mmol/L — ABNORMAL LOW (ref 101–111)
Creatinine, Ser: 0.37 mg/dL — ABNORMAL LOW (ref 0.44–1.00)
GFR calc Af Amer: >60 mL/min (ref >60)
GFR calc non Af Amer: >60 mL/min (ref >60)
Glucose, Bld: 84 mg/dL (ref 65–99)
Potassium: 3.6 mmol/L (ref 3.5–5.1)
SODIUM: 127 mmol/L — AB (ref 135–145)
Total Bilirubin: 13.6 mg/dL — ABNORMAL HIGH (ref 0.3–1.2)
Total Protein: 4.5 g/dL — ABNORMAL LOW (ref 6.5–8.1)

## 2016-09-10 MED ORDER — SODIUM CHLORIDE 0.9 % IV SOLN
2.0000 g | Freq: Once | INTRAVENOUS | Status: AC
  Administered 2016-09-10: 2 g via INTRAVENOUS
  Filled 2016-09-10: qty 20

## 2016-09-10 MED ORDER — MORPHINE SULFATE (CONCENTRATE) 10 MG/0.5ML PO SOLN: 10.0000 mg | ORAL | Status: DC | PRN

## 2016-09-10 NOTE — Progress Notes (Signed)
Pt reports chest heaviness  Vitals signs was taken as follows Temp.- 98.1 , BP- 142/ 83, Pulse- 94 , O2 sat- 99 at room air . Pt denies any pain and states she felt better after her vital signs been taken. We will continue to monitor.

## 2016-09-10 NOTE — Progress Notes (Signed)
[name] [name]   DOB:Jan 09, 1961   IN#:[id number]   BSJ#:[id number]  Subjective:  Concerned about swelling in legs and L arm; had breakthrough pain yesterday but "took it" as afraid of narcotics which have severely constipated her in past; concerned re appropriate flush of biliary drainage at home and PICC care; did have BM this AM, brown; husband in room   Objective: middle aged White woman examined in recliner  Vitals:   09/10/16 0347 09/10/16 0613  BP: (!) 142/83 134/79  Pulse: 94 87  Resp: 16 16  Temp: 98.1 F (36.7 C) 98.4 F (36.9 C)    Body mass index is 27.46 kg/m.  Intake/Output Summary (Last 24 hours) at 09/10/16 0812 Last data filed at 09/10/16 7654  Gross per 24 hour  Intake             2045 ml  Output             1641 ml  Net              404 ml    Oropharynx clear and moist No cervical or supraclavicular adenopathy Lungs no rales or rhonchi Heart regular rate and rhythm Abd nontender, diminished but positive bowel sounds, drain in place MSK no focal spinal tenderness, minimal L UE lymphedema, PICC in place Neuro: nonfocal, well oriented, anxious affect Breasts: Deferred  .  CBG (last 3)  No results for input(s): GLUCAP in the last 72 hours.   Labs:  Lab Results  Component Value Date   WBC 17.6 (H) 09/10/2016   HGB 8.6 (L) 09/10/2016   HCT 25.0 (L) 09/10/2016   MCV 98.8 09/10/2016   PLT 373 09/10/2016   NEUTROABS 15.7 (H) 09/10/2016    @LASTCHEMISTRY @  Urine Studies No results for input(s): UHGB, CRYS in the last 72 hours.  Invalid input(s): UACOL, UAPR, USPG, UPH, UTP, UGL, [place], UBIL, UNIT, UROB, [place], UEPI, UWBC, Junie Panning [place], [place], [place]  Basic Metabolic Panel:  Recent Labs Lab 09/06/16 [phone] 09/07/16 0346 09/08/16 0405 09/08/16 0923 09/09/16 0536 09/10/16 0506  NA 132* 128* 126*  --  123* 127*  K 3.3* 3.4* 3.7  --  3.8 3.6  CL 98* 95* 97*  --  94* 97*  CO2 26 23 22   --  20* 22  GLUCOSE 105* 111* 95  --  93 84  BUN 11 11  13   --  13 12  CREATININE <0.30* <0.30* <0.30*  --  0.46 0.37*  CALCIUM 8.0* 7.4* 6.4*  --  6.2* 6.6*  MG  --   --   --  1.7  --   --    GFR Estimated Creatinine Clearance: 76.7 mL/min (A) (by C-G formula based on SCr of 0.37 mg/dL (L)). Liver Function Tests:  Recent Labs Lab 09/06/16 0607 09/07/16 0346 09/08/16 0405 09/09/16 0536 09/10/16 0506  AST 331* 254* 157* 156* 155*  ALT 550* 489* 312* 254* 212*  ALKPHOS 766* 724* 530* 401* 334*  BILITOT 18.4* 19.2* 16.2* 15.4* 13.6*  PROT 5.3* 5.5* 5.0* 4.9* 4.5*  ALBUMIN 2.4* 2.7* 2.2* 2.0* 1.8*   No results for input(s): LIPASE, AMYLASE in the last 168 hours. No results for input(s): AMMONIA in the last 168 hours. Coagulation profile  Recent Labs Lab 09/05/16 1148  INR 1.30*  PROTIME 15.6*    CBC:  Recent Labs Lab 09/05/16 1148 09/06/16 0607 09/10/16 0506  WBC 11.8* 9.6 17.6*  NEUTROABS 9.6* 7.9* 15.7*  HGB 11.7 10.4* 8.6*  HCT 34.1* 29.7*  25.0*  MCV 101.2* 99.3 98.8  PLT 469* 473* 373   Cardiac Enzymes: No results for input(s): CKTOTAL, CKMB, CKMBINDEX, TROPONINI in the last 168 hours. BNP: Invalid input(s): POCBNP CBG: No results for input(s): GLUCAP in the last 168 hours. D-Dimer No results for input(s): DDIMER in the last 72 hours. Hgb A1c No results for input(s): HGBA1C in the last 72 hours. Lipid Profile No results for input(s): CHOL, HDL, LDLCALC, TRIG, CHOLHDL, LDLDIRECT in the last 72 hours. Thyroid function studies No results for input(s): TSH, T4TOTAL, T3FREE, THYROIDAB in the last 72 hours.  Invalid input(s): FREET3 Anemia work up No results for input(s): VITAMINB12, FOLATE, FERRITIN, TIBC, IRON, RETICCTPCT in the last 72 hours. Microbiology Recent Results (from the past 240 hour(s))  TECHNOLOGIST REVIEW     Status: None   Collection Time: 09/05/16 11:48 AM  Result Value Ref Range Status   Technologist Review   Final    Few Metas and Myelocytes present, mod target cells       Studies:   Nm Hepatobiliary Liver Func  Result Date: 08/26/2016 CLINICAL DATA:  Right upper quadrant pain EXAM: NUCLEAR MEDICINE HEPATOBILIARY IMAGING VIEWS: Anterior right upper quadrant RADIOPHARMACEUTICALS:  7.9 mCi Tc-19m Choletec IV COMPARISON:  Ultrasound right upper quadrant Aug 15, 2016 FINDINGS: Images were obtained serially over a 3 hour time span. Morphine was not administered due to history of allergic reaction to morphine. Liver uptake of radiotracer is inhomogeneous consistent with known liver metastases. Over a 3 hour time span, there is no appreciable visualization of either gallbladder or small bowel. IMPRESSION: Nonvisualization of gallbladder over a 3 hour time span is suggestive of cystic duct obstruction/ acute cholecystitis. Note, however, that there is also nonvisualization of small bowel after 3 hours. This finding potentially could be indicative of common bile duct obstruction. However, this finding also could be indicative of hepatic stasis phenomenon. Serum bilirubin is moderately elevated at this time. Known liver metastases demonstrated by inhomogeneous uptake of radiotracer. Given lack of visualization of both gallbladder and small bowel, it may be prudent to consider MRCP to assess for possible common bile duct lesion as the cause for lack of small bowel visualization on this study. Electronically Signed   By: WLowella GripIII M.D.   On: 08/26/2016 16:13   UKoreaAbdomen Complete  Result Date: 08/26/2016 CLINICAL DATA:  RIGHT upper quadrant pain beginning yesterday. Jaundice. History of metastatic breast cancer with known liver metastasis. EXAM: ABDOMEN ULTRASOUND COMPLETE COMPARISON:  Abdominal ultrasound Aug 15, 2016 and MRI of the abdomen July 24, 2016 FINDINGS: Gallbladder: 1.9 cm echogenic gallstone with acoustic shadowing surrounded by mildly echogenic sludge with increased through transmission. Mild gallbladder wall thickening without pericholecystic fluid. Sonographic Murphy's  sign elicited. Common bile duct: Diameter: 5 mm Liver: Multiple heterogeneous liver mass is consistent with patient's known metastasis. Porta hepatis lymphadenopathy present though, better characterized on prior MRI. IVC: No abnormality visualized. Pancreas: Visualized portion unremarkable. Spleen: Size and appearance within normal limits. Right Kidney: Length: 11.5 cm. Echogenicity within normal limits. No mass or hydronephrosis visualized. Left Kidney: Length: 11.8 cm. Echogenicity within normal limits. No mass or hydronephrosis visualized. Abdominal aorta: No aneurysm visualized. Other findings: None. IMPRESSION: Cholelithiasis and sonographic findings of acute cholecystitis. Numerous hepatic and nodal metastasis. Electronically Signed   By: CElon AlasM.D.   On: 08/26/2016 06:36   Mr 3d Recon At Scanner  Result Date: 08/29/2016 CLINICAL DATA:  Metastatic breast cancer with liver metastasis. Evaluate for metastasis versus biliary  obstruction. New chemotherapy. Jaundice. EXAM: MRI ABDOMEN WITHOUT AND WITH CONTRAST (INCLUDING MRCP) TECHNIQUE: Multiplanar multisequence MR imaging of the abdomen was performed both before and after the administration of intravenous contrast. Heavily T2-weighted images of the biliary and pancreatic ducts were obtained, and three-dimensional MRCP images were rendered by post processing. CONTRAST:  60m MULTIHANCE GADOBENATE DIMEGLUMINE 529 MG/ML IV SOLN COMPARISON:  Ultrasound 08/26/2016.  Most recent MRI of 07/24/2016. FINDINGS: Portions of exam are minimally motion degraded. Lower chest: Mild cardiomegaly. Left-sided pleural thickening remains. Index enhancing left pleural implant measures 1.4 cm on image 19/ series 11001. Compare 1.5 cm on the prior exam. More anterior implant measures 1.5 cm on image 20/ series 1101 compare 1.4 cm on the prior. Hepatobiliary: Extensive hepatic metastasis. Overall mild to (given short interval ) moderate progression. Example central right  hepatic lobe lesion at 3.3 x 3.8 cm on image 47/ series 11001. Compare 2.8 x 3.5 cm on the prior. Lateral segment left liver lobe lesion measures 3.1 cm on image 49/ series 11001. Compare 3.0 cm on the prior. A pericholecystic right liver lobe lesion measures 4.7 x 4.0 cm on image 60/ series 11001. Compare 3.8 x 2.9 cm on the prior. Interval development of moderate intrahepatic biliary duct dilatation bilaterally. This continues to the level of the porta hepatis. Example image 89/series 4 and image 80/series 400. Amorphous soft tissue signal in this region is likely the cause, secondary to extension of porta hepatis infiltrative adenopathy. Example image 31/series 3 and image 49/ series 11002. A gallstone is again identified. There is moderate gallbladder wall thickening on image 36/series 3. The distal common duct is normal in caliber. Pancreas:  Normal, without mass or ductal dilatation. Spleen:  Normal in size, without focal abnormality. Adrenals/Urinary Tract: Normal adrenal glands. Normal kidneys, without hydronephrosis. Stomach/Bowel: Normal stomach and abdominal bowel loops. Vascular/Lymphatic: Aortic atherosclerosis. Circumaortic left renal vein. Porta hepatis adenopathy is progressive as detailed above. Example component in the portal caval space at 1.6 cm on image 31/series 3. Compare 1.4 cm on the prior. Progressive retroperitoneal adenopathy. A new node in the aortocaval space measures 1.3 cm on image 39/series 3. Other:  Trace perihepatic ascites. Musculoskeletal: Re- demonstration of osseous metastasis. Example lesion with the T11 vertebral body which measures 1.5 cm on image 22/series 12. Compare 1.4 cm on the prior. The inferior L3 lesion measures 2.8 x 1.6 cm on image 28/series 12 versus 2.9 x 1.8 cm on the prior. IMPRESSION: 1. Progression of hepatic metastasis. 2. Progressive adenopathy within the porta hepatis with increased retroperitoneal nodal metastasis. 3. Development of moderate  intrahepatic biliary duct dilatation, secondary to soft tissue fullness in the porta hepatis, favored to related to infiltrative progressive adenopathy. Normal caliber common duct distally. 4. Cholelithiasis. Gallbladder wall thickening is nonspecific. Please see prior ultrasound. 5. Similar osseous and left pleural metastasis. Findings were reported to Dr. DLoletha Carrowat 1:35 p.m. Electronically Signed   By: KAbigail MiyamotoM.D.   On: 08/29/2016 13:36   Ir Fluoro Guide Cv Line Left  Result Date: 09/05/2016 INDICATION: Metastatic breast cancer to liver, poor venous access; request made for central venous access . EXAM: LEFT UPPER EXTREMITY PICC LINE PLACEMENT WITH ULTRASOUND AND FLUOROSCOPIC GUIDANCE MEDICATIONS: None ANESTHESIA/SEDATION: None FLUOROSCOPY TIME:  Fluoroscopy Time:  42 seconds COMPLICATIONS: None immediate. PROCEDURE: The patient was advised of the possible risks and complications and agreed to undergo the procedure. The patient was then brought to the angiographic suite for the procedure. The left arm was prepped with  chlorhexidine, draped in the usual sterile fashion using maximum barrier technique (cap and mask, sterile gown, sterile gloves, large sterile sheet, hand hygiene and cutaneous antisepsis) and infiltrated locally with 1% Lidocaine. Ultrasound demonstrated patency of the left brachial vein, and this was documented with an image. Under real-time ultrasound guidance, this vein was accessed with a 21 gauge micropuncture needle and image documentation was performed. A 0.018 wire was introduced in to the vein. Over this, a 5 [place] double lumen power injectable PICC was advanced to the lower SVC/right atrial junction. Fluoroscopy during the procedure and fluoro spot radiograph confirms appropriate catheter position. The catheter was flushed and covered with a sterile dressing. Catheter length:  41 cm IMPRESSION: Successful left arm power PICC line placement with ultrasound and fluoroscopic guidance.  The catheter is ready for use. Read by: Rowe Robert, PA-C Electronically Signed   By: Markus Daft M.D.   On: 09/05/2016 16:00   Ir US Guide Vasc Access Left  Result Date: 09/05/2016 INDICATION: Metastatic breast cancer to liver, poor venous access; request made for central venous access . EXAM: LEFT UPPER EXTREMITY PICC LINE PLACEMENT WITH ULTRASOUND AND FLUOROSCOPIC GUIDANCE MEDICATIONS: None ANESTHESIA/SEDATION: None FLUOROSCOPY TIME:  Fluoroscopy Time:  42 seconds COMPLICATIONS: None immediate. PROCEDURE: The patient was advised of the possible risks and complications and agreed to undergo the procedure. The patient was then brought to the angiographic suite for the procedure. The left arm was prepped with chlorhexidine, draped in the usual sterile fashion using maximum barrier technique (cap and mask, sterile gown, sterile gloves, large sterile sheet, hand hygiene and cutaneous antisepsis) and infiltrated locally with 1% Lidocaine. Ultrasound demonstrated patency of the left brachial vein, and this was documented with an image. Under real-time ultrasound guidance, this vein was accessed with a 21 gauge micropuncture needle and image documentation was performed. A 0.018 wire was introduced in to the vein. Over this, a 5 [place] double lumen power injectable PICC was advanced to the lower SVC/right atrial junction. Fluoroscopy during the procedure and fluoro spot radiograph confirms appropriate catheter position. The catheter was flushed and covered with a sterile dressing. Catheter length:  41 cm IMPRESSION: Successful left arm power PICC line placement with ultrasound and fluoroscopic guidance. The catheter is ready for use. Read by: Rowe Robert, PA-C Electronically Signed   By: Markus Daft M.D.   On: 09/05/2016 16:00   Mr Abdomen Mrcp W Wo Contast  Result Date: 08/29/2016 CLINICAL DATA:  Metastatic breast cancer with liver metastasis. Evaluate for metastasis versus biliary obstruction. New chemotherapy.  Jaundice. EXAM: MRI ABDOMEN WITHOUT AND WITH CONTRAST (INCLUDING MRCP) TECHNIQUE: Multiplanar multisequence MR imaging of the abdomen was performed both before and after the administration of intravenous contrast. Heavily T2-weighted images of the biliary and pancreatic ducts were obtained, and three-dimensional MRCP images were rendered by post processing. CONTRAST:  73m MULTIHANCE GADOBENATE DIMEGLUMINE 529 MG/ML IV SOLN COMPARISON:  Ultrasound 08/26/2016.  Most recent MRI of 07/24/2016. FINDINGS: Portions of exam are minimally motion degraded. Lower chest: Mild cardiomegaly. Left-sided pleural thickening remains. Index enhancing left pleural implant measures 1.4 cm on image 19/ series 11001. Compare 1.5 cm on the prior exam. More anterior implant measures 1.5 cm on image 20/ series 1101 compare 1.4 cm on the prior. Hepatobiliary: Extensive hepatic metastasis. Overall mild to (given short interval ) moderate progression. Example central right hepatic lobe lesion at 3.3 x 3.8 cm on image 47/ series 11001. Compare 2.8 x 3.5 cm on the prior. Lateral segment left liver  lobe lesion measures 3.1 cm on image 49/ series 11001. Compare 3.0 cm on the prior. A pericholecystic right liver lobe lesion measures 4.7 x 4.0 cm on image 60/ series 11001. Compare 3.8 x 2.9 cm on the prior. Interval development of moderate intrahepatic biliary duct dilatation bilaterally. This continues to the level of the porta hepatis. Example image 89/series 4 and image 80/series 400. Amorphous soft tissue signal in this region is likely the cause, secondary to extension of porta hepatis infiltrative adenopathy. Example image 31/series 3 and image 49/ series 11002. A gallstone is again identified. There is moderate gallbladder wall thickening on image 36/series 3. The distal common duct is normal in caliber. Pancreas:  Normal, without mass or ductal dilatation. Spleen:  Normal in size, without focal abnormality. Adrenals/Urinary Tract: Normal  adrenal glands. Normal kidneys, without hydronephrosis. Stomach/Bowel: Normal stomach and abdominal bowel loops. Vascular/Lymphatic: Aortic atherosclerosis. Circumaortic left renal vein. Porta hepatis adenopathy is progressive as detailed above. Example component in the portal caval space at 1.6 cm on image 31/series 3. Compare 1.4 cm on the prior. Progressive retroperitoneal adenopathy. A new node in the aortocaval space measures 1.3 cm on image 39/series 3. Other:  Trace perihepatic ascites. Musculoskeletal: Re- demonstration of osseous metastasis. Example lesion with the T11 vertebral body which measures 1.5 cm on image 22/series 12. Compare 1.4 cm on the prior. The inferior L3 lesion measures 2.8 x 1.6 cm on image 28/series 12 versus 2.9 x 1.8 cm on the prior. IMPRESSION: 1. Progression of hepatic metastasis. 2. Progressive adenopathy within the porta hepatis with increased retroperitoneal nodal metastasis. 3. Development of moderate intrahepatic biliary duct dilatation, secondary to soft tissue fullness in the porta hepatis, favored to related to infiltrative progressive adenopathy. Normal caliber common duct distally. 4. Cholelithiasis. Gallbladder wall thickening is nonspecific. Please see prior ultrasound. 5. Similar osseous and left pleural metastasis. Findings were reported to Dr. Loletha Carrow at 1:35 p.m. Electronically Signed   By: Abigail Miyamoto M.D.   On: 08/29/2016 13:36   Ir Int Lianne Cure Biliary Drain With Cholangiogram  Result Date: 09/06/2016 INDICATION: History of metastatic breast cancer, now with malignant obstructive jaundice. Request made for placement of percutaneous biliary drainage catheter in hopes of optimizing liver function tests to improve patient's candidacy for potential Y 90 or additional systemic therapies. EXAM: ULTRASOUND AND FLUOROSCOPIC GUIDED PERCUTANEOUS TRANSHEPATIC CHOLANGIOGRAM AND BILIARY TUBE PLACEMENT COMPARISON:  CT abdomen pelvis - 09/05/2016; abdominal MRI - 08/29/2016  MEDICATIONS: Zosyn 3.375 g IV ; the antibiotic was administered with an appropriate time frame prior to the initiation of the procedure. CONTRAST:  34m ISOVUE-300 IOPAMIDOL (ISOVUE-300) INJECTION 61% - administered into the biliary tree ANESTHESIA/SEDATION: Moderate (conscious) sedation was employed during this procedure. A total of Versed 5 mg and Fentanyl 250 mcg was administered intravenously. Moderate Sedation Time: 57 minutes. The patient's level of consciousness and vital signs were monitored continuously by radiology nursing throughout the procedure under my direct supervision. FLUOROSCOPY TIME:  10 minutes 54 seconds (2932mGy) COMPLICATIONS: None immediate. TECHNIQUE: Informed written consent was obtained from the patient after a discussion of the risks, benefits and alternatives to treatment. Questions regarding the procedure were encouraged and answered. A timeout was performed prior to the initiation of the procedure. The midline of the upper abdominal quadrant was prepped and draped in the usual sterile fashion, and a sterile drape was applied covering the operative field. Maximum barrier sterile technique with sterile gowns and gloves were used for the procedure. A timeout was performed prior to  the initiation of the procedure. Under direct ultrasound guidance, a dilated duct within the peripheral aspect of the left lobe of the liver was accessed with a [place] needle. Contrast injection confirmed appropriate positioning. A Nitrex wire was advanced to the level of the malignant obstruction at the level the biliary hilum. Despite prolonged efforts, the inner 3 French catheter from the [place] set could not be advanced into the biliary tree given the cranial location of the left lobe of the liver as well as the angulation of the accessed duct. As such, the more peripheral aspect of the dominant dilated duct within the left lobe of the liver was accessed with an additional [place] needle.  A Nitrex wire was advanced to the level of the malignant obstruction at the level of the biliary hilum. Again, there is difficulty advancing the inner 3 French catheter from the [place] set and as such, a stiff micro puncture sheath sheath was utilized to secure the access in to biliary tree and allowed for placement of an Amplatz wire which was coiled within the biliary hilum. Under intermittent fluoroscopic guidance, the micropuncture sheath was exchanged for a a 4 French angled glide catheter and ultimately, a [place] catheter, which was utilized to manipulate the Amplatz wire through the common bile duct to the level of the duodenum. Contrast injection confirmed appropriate positioning. The [place] catheter was utilized for measurement purposes. Additional sideholes were cut approximately 2 cm peripheral to the radiopaque marker of an 8 French percutaneous biliary drainage catheter. Under intermittent fluoroscopic guidance, an 8 French percutaneous biliary drainage catheter was advanced over an Amplatz wire with tip ultimately coiled within the descending duodenum. Contrast injection was performed demonstrating appropriate positioning and functionality of the biliary drainage catheter. Multiple spot fluoroscopic radiographic image were obtained in various obliquities. The catheter was connected to a drainage bag which yielded the brisk return of clear bile. The catheter was secured to the skin with an interrupted suture. The patient tolerated the procedure well without immediate postprocedural complication. FINDINGS: Ultrasound scanning demonstrates several hypoechoic lesions within the left lobe of liver compatible with known hepatic metastatic disease. Moderate intrahepatic biliary duct dilatation was again demonstrated within the left lobe of the liver Ultimately, under direct ultrasound guidance, a peripherally dilated duct in the left lobe of the liver was accessed allowing placement of a 8 French  percutaneous biliary drainage catheter with and coiled and locked within the duodenum. Note, an 8 [place] percutaneous drain was placed in lieu of a 10 [place] as there was difficulty advancing the [place] set due to the cranial location of the left lobe of the liver and required angulation of the access bile duct. IMPRESSION: Successful placement of an 8 French percutaneous biliary drainage catheter with end coiled and locked within the duodenum. PLAN: - Patient will be followed with daily CMP levels in hopes of improvement in serum bilirubin levels. - Pending the trend of the patient's LFTs, the patient will likely return for repeat cholangiogram and potential biliary drainage catheter exchange and up sizing with potential capping later this week. - Ultimately, the patient's bilirubin will need to improve 2 to be a candidate for Y 90 Radioembolization. Electronically Signed   By: Sandi Mariscal M.D.   On: 09/06/2016 11:15   Ct Angio Abd/pel W/ And/or W/o  Result Date: 09/05/2016 CLINICAL DATA:  History of metastatic breast cancer with progressive hepatic metastatic disease. Please perform CTA for evaluation of candidacy for Y 90 radioembolization. EXAM: CTA  ABDOMEN AND PELVIS WITH CONTRAST TECHNIQUE: Multidetector CT imaging of the abdomen and pelvis was performed using the standard protocol during bolus administration of intravenous contrast. Multiplanar reconstructed images and MIPs were obtained and reviewed to evaluate the vascular anatomy. CONTRAST:  100 cc Isovue 370 COMPARISON:  Abdominal MRI - 08/29/2016 ; 07/24/2016; PET-CT -05/14/2016 FINDINGS: VASCULAR Aorta: Minimal amount of atherosclerotic plaque within a normal caliber abdominal aorta, not resulting in hemodynamically significant stenosis. No abdominal aortic dissection or periaortic stranding. Celiac: Widely patent. Conventional takeoff of the GDA. Note is made of separate origins of the left lateral hepatic segmental artery with middle hepatic  artery which supplies the medial segment of the left lobe of the liver as well as a portion of the anterior segment of the right lobe of the liver. Otherwise, conventional branching pattern. The right gastric artery is not definitely identified. SMA: Widely patent.  Conventional branching pattern. Renals: Solitary bilaterally. The bilateral renal arteries are widely patent without hemodynamically significant narrowing. No vessel irregularity to suggest FMD. IMA: Widely patent. Inflow: The bilateral common, external and internal iliac artery is are tortuous lobe normal caliber and widely patent without hemodynamically significant stenosis. Proximal Outflow: The bilateral common, superficial and deep femoral arteries appear widely patent throughout their imaged course. Veins: The IVC and pelvic venous system appears widely patent. Review of the MIP images confirms the above findings. NON-VASCULAR Lower chest: Limited visualization of the lower thorax demonstrates consolidative opacities within the imaged left lower lobe with associated volume loss and trace left-sided pleural effusion. Known hypermetabolic nodule within the medial basilar aspect the left pleural space is grossly unchanged measuring 1.5 cm in diameter (image 8, series 5). No new focal airspace opacities. Normal heart size.  No pericardial effusion. Hepatobiliary: Normal hepatic contour, however loculated fluid/bile is noted about the subcapsular aspect of the right lobe of the liver (image 29, series 16), similar to recent abdominal MRI. Re- demonstrated extensive hepatic metastatic disease, similar to recently obtained abdominal MRI with dominant lesion within the dome of the right lobe of liver measuring approximately 2.6 x 2.6 cm (image 24, series 6 dominant lesion within the subcapsular aspect the right lobe of the liver measuring approximately 3.0 x 3.7 cm (image 42, series 6) and dominant lesion within the subcapsular aspect the right lobe of the  liver measuring approximately 7.5 x 4.2 cm (image 66). Re- demonstrated intrahepatic biliary duct dilatation with occlusion of the biliary system at the level the hilum. The main portal vein is tapered/narrowed at the level of the biliary hilum though needs patent. A laminated approximately 1.6 cm gallstone is noted within the fundus of the gallbladder (image 86, series 4) with associated minimal amount of pericholecystic stranding. Pancreas: Normal appearance of the pancreas Spleen: Normal appearance of the spleen Adrenals/Urinary Tract: There is symmetric enhancement and excretion of the bilateral kidneys. No definite renal stones this postcontrast examination. No discrete renal lesions. No urine obstruction or perinephric stranding. Normal appearance the bilateral adrenal glands. Normal appearance of the urinary bladder given degree distention. Stomach/Bowel: Scattered colonic diverticulosis without evidence of diverticulitis. Normal appearance of the terminal ileum. The appendix is not visualized, however there is no pericecal inflammatory change. No pneumoperitoneum, pneumatosis or portal venous gas. Lymphatic: Compared to PET-CT performed 05/14/2016, there has been development of porta hepatis and retroperitoneal lymphadenopathy with index left-sided periaortic lymph node measuring 1.3 cm in greatest short axis diameter (image 70, series 4), index aortocaval lymph nodes measuring 1.2 cm (image 79) and 1.1 cm (  image 100) and index gastrohepatic lymph ligament lymph node measuring 0.9 cm (image 46, series 4 Reproductive: Normal appearance of the pelvic organs. Small amount of free fluid in the pelvic cul-de-sac. Other: Regional soft tissues appear normal. Musculoskeletal: Moderate scoliotic curvature of the thoracolumbar spine with dominant caudal component convex to the left measuring approximately 30 degrees (as measured from the superior endplate of U72 to the inferior endplate of L4. Similar appearance of  known pathologic fracture involving the right inferior pubic ramus (image 205, series 4). Approximately 1.3 x 1.5 cm sclerotic lesion within the right-side of the T11 vertebral body is unchanged (image 15, series 16). Potential new approximately 1.8 x 1.7 cm lytic lesion involving the posterior aspects of the L3 vertebral body (sagittal image 115, series 9). IMPRESSION: VASCULAR 1. Minimal amount of atherosclerotic plaque with a normal caliber abdominal aorta. Aortic Atherosclerosis (ICD10-I70.0). 2. Conventional anatomy amenable to Y 90 radioembolization. NON-VASCULAR 1. Compared to recent obtain abdominal MRI, unchanged extensive hepatic metastatic disease, primarily affecting the right lobe of the liver with obstruction of the biliary system at the level the hilum with associated intrahepatic biliary duct dilatation affecting both the right and left lobes of the liver. 2. Compared to PET-CT performed 05/14/2016, interval development of porta hepatis and retroperitoneal lymphadenopathy as detailed above. 3. Grossly unchanged metastatic disease within the imaged left lower lung / pleural as detailed above. 4. Potential new approximately 1.8 cm lytic lesion involving the posterior aspect of the L3 vertebral body. Further evaluation lumbar spine MRI could performed as clinically indicated. Electronically Signed   By: Sandi Mariscal M.D.   On: 09/05/2016 16:57   US Abdomen Limited Ruq  Result Date: 08/15/2016 CLINICAL DATA:  Acute right upper quadrant pain, metastatic breast cancer to the liver EXAM: US ABDOMEN LIMITED - RIGHT UPPER QUADRANT COMPARISON:  07/24/2016 FINDINGS: Gallbladder: Echogenic shadowing gallstone noted measuring 1.7 cm. Normal wall thickness measuring 2.4 mm. No Murphy's sign. No pericholecystic fluid. Negative for cholecystitis by ultrasound. Common bile duct: Diameter: 4.6 mm Liver: Numerous solid hepatic metastases again evident, largest lesions are in the right lobe measuring up to 7.8 cm in  diameter. No biliary dilatation or obstruction.  No ascites. IMPRESSION: Cholelithiasis. Negative for acute cholecystitis or biliary obstruction Extensive hepatic metastases Electronically Signed   By: Jerilynn Mages.  Shick M.D.   On: 08/15/2016 11:22    Assessment: 56 y.o. BRCA negative [place] woman with stage IV breast cancer, history as follows  (1)  S/p Right upper inner quadrant lumpectomy and sentinel lymph node sampling 03/15/2004 for a pT1c pN0. Stage IA invasive ductal carcinoma, grade 2, estrogen receptor 95% positive, progesterone receptor 65% positive, HER-2 not amplified; additional surgery 04/25/2004 for seroma or clearance showed no residual tumor  (2) adjuvant chemotherapy with cyclophosphamide and doxorubicin every 21 days x4 completed 07/19/2004  (3) adjuvant radiation given under Dr. Donella Stade in [place] completed July 2006  (4) the patient opted against adjuvant antiestrogen therapy  (5) genetics testing showed no BRCA mutations  (6) biopsy of a palpable right axillary mass 10/24/2009 showed invasive ductal carcinoma, grade 3, estrogen receptor 100% positive, progesterone receptor 2% positive (alert score 5) HER-2 negative; no evidence of systemic disease on PET scanning  (7) completed 3 of 4 planned cycles of docetaxel and cyclophosphamide September 2011, fourth cycle omitted because of marked elevations in liver function tests  (8) an right axillary lymph node dissection 03/06/2010 showed 3/8 lymph nodes removed to be involved by tumor, with extracapsular extension.  (9) 45  Gy radiation to the right axillary and right supraclavicular nodal areas, with capecitabine sensitization, completed March 2012   (10) intolerant of letrozole and exemestane; on tamoxifen with interruptions September 2012 to March 2013, but then continuing on tamoxifen more continuously through March of 2015  (11) biopsy of mediastinal adenopathy 06/02/2013 shows invasive ductal carcinoma (gross  cystic disease fluid protein positive, TTS-1 negative), estrogen receptor 80% positive, progesterone receptor 2% positive, HER-2 not amplified  (12) letrozole started March 2015-- tolerated with significant side effects, discontinued at the end of May 2015  (13) PET scan 08/16/2013 shows extensive left pleural metastatic disease and a large left pleural effusion that shifts cardiac and mediastinal structures to the right; adenopathy (celiac trunk, periadrenal, periaortic); and a left medial clavicular lesion; Status post left thoracentesis 08/16/2013 positive for adenocarcinoma, estrogen receptor positive, progesterone receptor negative.  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; everolimus was suggested as an available targeted agent  (18) exemestane started 03/31/2014, discontinued 10/31/2014 with evidence of progression             (a) everolimus added 04/03/2014 but not tolerated (cytopenias, elevated LFTs) even at minimal doses; stopped 04/17/2014  (19) fulvestrant started 12/20/2014             (a) palbociclib added at very low dose 04/03/2015 (starting dose 75 mg weekly)             (b) palbociclib dose gradually increased to 75 mg daily, 21/7, as of May 2017             (c) palbociclib dose increased to 100 mg daily, 21/ 7, beginning November mid- cycle             (d) palbociclib dose decreased to 75 mg daily beginning with cycle starting 05/25/2016             (e) letrozole 2.5 mg started  05/26/2016  (20) liver biopsy 03/20/2015 confirms metastatic carcinoma, still estrogen receptor positive at 100%, progesterone receptor negative, HER-2 equivocal with a signals ratio 1.41, number per cell 4.50.              (a) repeat liver biopsy December 2017 might show further changes (HER-2 positivity)  (21) immunohistochemistry for mismatched repair protein mutations 03/20/2015 showed normal major and minor MMR proteins, with a very low probability of microsatellite instability ([id number])  (22) adjuvant radiation 12/24/15-01/02/16 Site/dose:1) Left T9 Rib / 24 Gy in 8 fx 2) Right inferior pelvis/ 24 Gy in 8 fx  (23) consider pembrolizumab (obtained compassionate release from company)             (a) liver biopsy 03/20/2015 negative for PD-1   (24) 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             (a) s/p L hepatic approach transhepatic 8 Fr biliary drainage catheter placed 09/06/2016 with end coiled and locked within duodenum.   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 September 2011 treated with Lovenox for 5-6 months  (c) right upper extremity lymphedema--receiving physical therapy  (d) hepatic steatosis with chronically elevated LFTs as well as unusual hepatic sensitivity to chemotherapy  (e) osteopenia with the lowest T score -1.6 on bone density scan 06/20/2013             (i) on denosumab/ Xgeva Q28d  (f) radiation oncology (Dr Valere Dross) has reviewed prior radiation records in case there is further mediastinal involvement with dysphagia etc in which case palliative XRT could be considered             (a) radiation to left mediastinum/ left 7th rib 3250 cGy in 13 sessions04/18/2016 through 08/02/2014             (b) radiation to T11 area: 22 Gy in 7 sessions, last dose 11/27/2014             (c)  radiation left parietal scalp region to be completed 12/22/2014             (d) radiation to sacral area completed 04/09/2015             (e) radiation to right inferior pelvis and left ninth rib (24 gray, 12/24/2015--01/02/2016)             (f) T11 was treated stereotactically with 14 gray in 1 fraction 03/12/2016.  (g) chest wall and perineal pain--improved post radiation treatments             (a) discussed celebrex/ carafate but holding off for now  (h) zoster diagnosed 04/04/2015-- on valacyclovir--resolved  (i) hypocalcemia-- stable on supplementation; Mg++ WNL  (j) hyponatremia: improved on fluid restriction  (k) anasarca: due to hypoalbuminemia; advised to avoid salt   PLAN: Auriel's situation is complex and it may be she will "bounce back" to the ED but I think she may be ready for d/c tomorrow; we will set her up with [facility] [place], which she is familiar with; she will need flushing and care of the bile drainage and the PICC.  Discussed pain and I have added roxanol to be used PRN.  She has an appointment 06/15 at [facility] for a second opinion regarding further management  She will have an out of facility DNR order in place at discharge  Chauncey Cruel, MD 09/10/2016  8:12 AM Medical Oncology and Hematology [facility] [place], [place] [address] Tel. [phone]    Fax. [phone]

## 2016-09-10 NOTE — Progress Notes (Signed)
Pt selected [facility] for [facility], referral given to in house rep.

## 2016-09-10 NOTE — Progress Notes (Signed)
Pt reported abdominal distention and tightness. Nurse assessed and called Dr. Virgie Dad office. Left message to return call and advise. Will continue to monitor.

## 2016-09-10 NOTE — Progress Notes (Signed)
Referring Physician(s): Chauncey Cruel  Supervising Physician: Sandi Mariscal  Patient Status:  [facility] - In-pt  Chief Complaint: Metastatic breast cancer, biliary obstruction    Subjective: Patient without acute changes   Allergies: 2nd skin quick heal; Decadron [dexamethasone]; Dilaudid [hydromorphone]; Enoxaparin; Fluconazole; Hydromorphone hcl; Morphine and related; Ondansetron; Protonix [pantoprazole sodium]; and Tegaderm ag mesh [silver]  Medications: Prior to Admission medications   Medication Sig Start Date End Date Taking? Authorizing Provider  acetaminophen (TYLENOL) 500 MG tablet Take 1 tablet (500 mg total) by mouth 4 (four) times daily -  with meals and at bedtime. 08/30/16  Yes Gherghe, Vella Redhead, MD  B Complex-C (B-COMPLEX WITH VITAMIN C) tablet Take 1 tablet by mouth daily. Reported on 03/27/2015 01/19/15  Yes Magrinat, Virgie Dad, MD  cholecalciferol 2000 UNITS tablet Take 1 tablet (2,000 Units total) by mouth daily. 01/19/15  Yes Magrinat, Virgie Dad, MD  famotidine (PEPCID) 40 MG tablet Take 0.5-1 tablets (20-40 mg total) by mouth 2 (two) times daily. 40 mg in the morning and 20 mg in the evening Patient taking differently: Take 20 mg by mouth 2 (two) times daily.  08/30/16  Yes Gherghe, Vella Redhead, MD  folic acid (FOLVITE) 1 MG tablet Take 1 tablet (1 mg total) by mouth daily. 01/19/15  Yes Magrinat, Virgie Dad, MD  LORazepam (ATIVAN) 0.5 MG tablet Take 1 tablet (0.5 mg total) by mouth every 8 (eight) hours as needed for anxiety. 08/30/16 08/30/17 Yes Gherghe, Vella Redhead, MD  Melatonin 3 MG TABS Take 3 mg by mouth at bedtime.   Yes [provider]  naproxen (NAPROSYN) 250 MG tablet Take 1 tablet (250 mg total) by mouth 4 (four) times daily -  with meals and at bedtime. 08/30/16  Yes Gherghe, Vella Redhead, MD  saccharomyces boulardii (FLORASTOR) 250 MG capsule Take 250 mg by mouth daily.  03/23/15  Yes Magrinat, Virgie Dad, MD  ciprofloxacin (CIPRO) 500 MG tablet Take 1 tablet  (500 mg total) by mouth 2 (two) times daily. Patient not taking: Reported on 09/05/2016 08/30/16   Caren Griffins, MD  dexamethasone (DECADRON) 4 MG tablet Take 1 tablet (4 mg total) by mouth 2 (two) times daily. Patient not taking: Reported on 09/05/2016 08/30/16   Caren Griffins, MD  ketorolac (TORADOL) 10 MG tablet Take 1 tablet (10 mg total) by mouth every 6 (six) hours as needed. 08/30/16   Caren Griffins, MD  phenazopyridine (PYRIDIUM) 100 MG tablet Take 1 tablet (100 mg total) by mouth 3 (three) times daily as needed for pain. Patient not taking: Reported on 08/15/2016 05/16/16   Magrinat, Virgie Dad, MD  RABEprazole Sodium 5 MG CPSP Take 5 mg by mouth daily. Patient not taking: Reported on 09/05/2016 10/08/15   Magrinat, Virgie Dad, MD  vitamin E (VITAMIN E) 400 UNIT capsule Take 1 capsule (400 Units total) by mouth 2 (two) times daily. Patient not taking: Reported on 08/15/2016 05/16/16   Kyung Rudd, MD     Vital Signs: BP (!) 146/76 (BP Location: Right Leg)   Pulse 97   Temp 98 F (36.7 C) (Oral)   Resp 18   Ht 5\' 4"  (1.626 m)   Wt 160 lb (72.6 kg)   SpO2 99%   BMI 27.46 kg/m   Physical Exam  bili drain intact, insertion site okay, dressing dry, output 125 mL green bile; drain flushing well per nurse; no evidence of leak Imaging: No results found.  Labs:  CBC:  Recent Labs  09/02/16 1127 09/05/16 1148 09/06/16 0607 09/10/16 0506  WBC 12.5* 11.8* 9.6 17.6*  HGB 11.7 11.7 10.4* 8.6*  HCT 33.1* 34.1* 29.7* 25.0*  PLT 477* 469* 473* 373    COAGS:  Recent Labs  08/27/16 0404 08/28/16 0721 08/29/16 0508 09/05/16 1148  INR 1.07 1.07 1.11 1.30*    BMP:  Recent Labs  09/07/16 0346 09/08/16 0405 09/09/16 0536 09/10/16 0506  NA 128* 126* 123* 127*  K 3.4* 3.7 3.8 3.6  CL 95* 97* 94* 97*  CO2 23 22 20* 22  GLUCOSE 111* 95 93 84  BUN 11 13 13 12   CALCIUM 7.4* 6.4* 6.2* 6.6*  CREATININE <0.30* <0.30* 0.46 0.37*  GFRNONAA NOT CALCULATED NOT CALCULATED >60 >60    GFRAA NOT CALCULATED NOT CALCULATED >60 >60    LIVER FUNCTION TESTS:  Recent Labs  09/07/16 0346 09/08/16 0405 09/09/16 0536 09/10/16 0506  BILITOT 19.2* 16.2* 15.4* 13.6*  AST 254* 157* 156* 155*  ALT 489* 312* 254* 212*  ALKPHOS 724* 530* 401* 334*  PROT 5.5* 5.0* 4.9* 4.5*  ALBUMIN 2.7* 2.2* 2.0* 1.8*    Assessment and Plan: Patient with history of stage IV breast cancer with obstructive jaundice;status post internal/external biliary drain placement on 09/06/16;afebrile; total bilirubin 13.6(15.4), other LFTs slowly improving, creatinine normal, hemoglobin 8.6(10.4), wbc 17.6(9.6); will send bile for culture; continue with q 8 hr saline flushes of drain while in-house; maintain drain to gravity bag until LFTs are normalized or reach nadir; continue to monitor liver function tests daily while in-house and every 3 days upon discharge; left upper extremity PICC to remain in place until definitive plan is established. Once patient discharged home recommend once daily irrigation of biliary drain with 5 mL sterile normal saline, daily recording of drain output and dressing changes every other day with opsite as well as MediPore or paper tape. If patient has questions regarding biliary drain after discharge may call [phone] and ask to speak with IR M.D. on call for further directions.    Electronically Signed: D. Rowe Robert, PA-C 09/10/2016, 5:22 PM   I spent a total of 25 minutes at the the patient's bedside AND on the patient's hospital floor or unit, greater than 50% of which was counseling/coordinating care for biliary drain    Patient ID: [name] [name], female   DOB: 11-Jun-1960, 56 y.o.   MRN: [id number]

## 2016-09-10 NOTE — Progress Notes (Signed)
Spoke with Dr. Virgie Dad nurse, Elmyra Ricks. Updated her on pt status. She stated she will speak with Dr. Jana Hakim and call this nurse back.   Received return call from [place], per above. She stated Dr. Jana Hakim said to reduce pt's fluids to [facility] and not to administer Lasix. Pt and husband notified and agreeable to plan. Fluids adjusted and charge nurse notified. Will continue to monitor.

## 2016-09-11 ENCOUNTER — Telehealth: Payer: Self-pay | Admitting: Oncology

## 2016-09-11 ENCOUNTER — Other Ambulatory Visit: Payer: Self-pay

## 2016-09-11 ENCOUNTER — Other Ambulatory Visit: Payer: Self-pay | Admitting: Oncology

## 2016-09-11 LAB — COMPREHENSIVE METABOLIC PANEL
ALBUMIN: 1.9 g/dL — AB (ref 3.5–5.0)
ALT: 222 U/L — ABNORMAL HIGH (ref 14–54)
AST: 187 U/L — AB (ref 15–41)
Alkaline Phosphatase: 331 U/L — ABNORMAL HIGH (ref 38–126)
Anion gap: 8 (ref 5–15)
BUN: 11 mg/dL (ref 6–20)
CHLORIDE: 96 mmol/L — AB (ref 101–111)
CO2: 22 mmol/L (ref 22–32)
Calcium: 7.4 mg/dL — ABNORMAL LOW (ref 8.9–10.3)
Creatinine, Ser: 0.47 mg/dL (ref 0.44–1.00)
GFR calc Af Amer: >60 mL/min (ref >60)
GFR calc non Af Amer: >60 mL/min (ref >60)
Glucose, Bld: 91 mg/dL (ref 65–99)
POTASSIUM: 3.4 mmol/L — AB (ref 3.5–5.1)
Sodium: 126 mmol/L — ABNORMAL LOW (ref 135–145)
Total Bilirubin: 14.8 mg/dL — ABNORMAL HIGH (ref 0.3–1.2)
Total Protein: 4.8 g/dL — ABNORMAL LOW (ref 6.5–8.1)

## 2016-09-11 LAB — CBC WITH DIFFERENTIAL/PLATELET
BASOS ABS: 0 10*3/uL (ref 0.0–0.1)
BASOS PCT: 0 %
EOS ABS: 0.2 10*3/uL (ref 0.0–0.7)
EOS PCT: 1 %
HCT: 25.2 % — ABNORMAL LOW (ref 36.0–46.0)
Hemoglobin: 9 g/dL — ABNORMAL LOW (ref 12.0–15.0)
Lymphocytes Relative: 5 %
Lymphs Abs: 0.9 10*3/uL (ref 0.7–4.0)
MCH: 34.9 pg — ABNORMAL HIGH (ref 26.0–34.0)
MCHC: 35.7 g/dL (ref 30.0–36.0)
MCV: 97.7 fL (ref 78.0–100.0)
MONO ABS: 0.8 10*3/uL (ref 0.1–1.0)
Monocytes Relative: 5 %
Neutro Abs: 15.9 10*3/uL — ABNORMAL HIGH (ref 1.7–7.7)
Neutrophils Relative %: 89 %
PLATELETS: 396 10*3/uL (ref 150–400)
RBC: 2.58 MIL/uL — ABNORMAL LOW (ref 3.87–5.11)
RDW: 17.3 % — AB (ref 11.5–15.5)
WBC: 17.8 10*3/uL — ABNORMAL HIGH (ref 4.0–10.5)

## 2016-09-11 MED ORDER — PROMETHAZINE HCL 12.5 MG PO TABS: 12.5000 mg | ORAL_TABLET | Freq: Four times a day (QID) | ORAL | 0 refills | Status: AC | PRN

## 2016-09-11 MED ORDER — TRAMADOL HCL 50 MG PO TABS: 50.0000 mg | ORAL_TABLET | Freq: Four times a day (QID) | ORAL | 0 refills | Status: DC | PRN

## 2016-09-11 MED ORDER — ALUM & MAG HYDROXIDE-SIMETH 200-200-20 MG/5ML PO SUSP: 30.0000 mL | Freq: Four times a day (QID) | ORAL | 0 refills | Status: DC | PRN

## 2016-09-11 MED ORDER — POLYETHYLENE GLYCOL 3350 17 G PO PACK: 17.0000 g | PACK | Freq: Every day | ORAL | 0 refills | Status: AC

## 2016-09-11 MED ORDER — DOCUSATE SODIUM 100 MG PO CAPS: 100.0000 mg | ORAL_CAPSULE | Freq: Two times a day (BID) | ORAL | 0 refills | Status: AC

## 2016-09-11 MED ORDER — KETOROLAC TROMETHAMINE 30 MG/ML IJ SOLN
INTRAMUSCULAR | Status: AC
  Filled 2016-09-11: qty 1

## 2016-09-11 MED ORDER — MORPHINE SULFATE (CONCENTRATE) 10 MG/0.5ML PO SOLN: 10.0000 mg | ORAL | 0 refills | Status: DC | PRN

## 2016-09-11 MED ORDER — KETOROLAC TROMETHAMINE 10 MG PO TABS: 10.0000 mg | ORAL_TABLET | Freq: Four times a day (QID) | ORAL | 6 refills | Status: DC | PRN

## 2016-09-11 MED ORDER — CALCIUM CARBONATE-VITAMIN D 500-200 MG-UNIT PO TABS: 2.0000 | ORAL_TABLET | Freq: Two times a day (BID) | ORAL | 6 refills | Status: AC

## 2016-09-11 NOTE — Progress Notes (Signed)
Patient given discharge instructions, expresses understanding. D/c home in private vehicle

## 2016-09-11 NOTE — Discharge Summary (Signed)
Physician Discharge Summary  Patient ID: [name] [name] MRN: [id number] DOB/AGE: 1961-02-09 56 y.o.  Admit date: 09/05/2016 Discharge date: 09/11/2016  Primary Care Physician:  [name] Cruel, MD   Discharge Diagnoses:  Severe intrahepatic cholestasis, hepatic failure, metastatic breast cancer, uncontrolled pain, jaundice, poor access, malnutrition  Present on Admission: . Cholestasis, intrahepatic   Discharge Medications:  Allergies as of 09/11/2016      Reactions   2nd Skin Quick Heal Other (See Comments)   Other Reaction: Skin peels   Decadron [dexamethasone] Other (See Comments)   Patient does not tolerate steroids.    Dilaudid [hydromorphone] Nausea And Vomiting   Enoxaparin Other (See Comments)   unknown   Fluconazole Swelling   Liver toxicity   Hydromorphone Hcl Nausea And Vomiting   Morphine And Related Nausea And Vomiting   Ondansetron Other (See Comments)   "makes me loopy"   Protonix [pantoprazole Sodium] Other (See Comments)   Patient reports it caused thrush.   Tegaderm Ag Mesh [silver]       Medication List    STOP taking these medications   acetaminophen 500 MG tablet Commonly known as:  TYLENOL   ciprofloxacin 500 MG tablet Commonly known as:  CIPRO   dexamethasone 4 MG tablet Commonly known as:  DECADRON   naproxen 250 MG tablet Commonly known as:  NAPROSYN   phenazopyridine 100 MG tablet Commonly known as:  PYRIDIUM   RABEprazole Sodium 5 MG Cpsp   vitamin E 400 UNIT capsule Commonly known as:  vitamin E     TAKE these medications   alum & mag hydroxide-simeth 200-200-20 MG/5ML suspension Commonly known as:  MAALOX/MYLANTA Take 30 mLs by mouth every 6 (six) hours as needed for indigestion, heartburn or flatulence (Bloating,gas pain).   B-complex with vitamin C tablet Take 1 tablet by mouth daily. Reported on 03/27/2015   calcium-vitamin D 500-200 MG-UNIT tablet Commonly known as:  OSCAL WITH D Take 2 tablets by  mouth 2 (two) times daily.   docusate sodium 100 MG capsule Commonly known as:  COLACE Take 1 capsule (100 mg total) by mouth 2 (two) times daily.   famotidine 40 MG tablet Commonly known as:  PEPCID Take 0.5-1 tablets (20-40 mg total) by mouth 2 (two) times daily. 40 mg in the morning and 20 mg in the evening What changed:  how much to take  additional instructions   FLORASTOR 250 MG capsule Generic drug:  saccharomyces boulardii Take 250 mg by mouth daily.   folic acid 1 MG tablet Commonly known as:  FOLVITE Take 1 tablet (1 mg total) by mouth daily.   ketorolac 10 MG tablet Commonly known as:  TORADOL Take 1 tablet (10 mg total) by mouth every 6 (six) hours as needed.   LORazepam 0.5 MG tablet Commonly known as:  ATIVAN Take 1 tablet (0.5 mg total) by mouth every 8 (eight) hours as needed for anxiety.   Melatonin 3 MG Tabs Take 3 mg by mouth at bedtime.   morphine CONCENTRATE 10 MG/0.5ML Soln concentrated solution Take 0.5 mLs (10 mg total) by mouth every 2 (two) hours as needed for moderate pain or shortness of breath.   polyethylene glycol packet Commonly known as:  MIRALAX / GLYCOLAX Take 17 g by mouth daily.   promethazine 12.5 MG tablet Commonly known as:  PHENERGAN Take 1 tablet (12.5 mg total) by mouth every 6 (six) hours as needed for nausea.   traMADol 50 MG tablet Commonly known as:  Veatrice Bourbon  Take 1-2 tablets (50-100 mg total) by mouth every 6 (six) hours as needed.   Vitamin D 2000 units tablet Take 1 tablet (2,000 Units total) by mouth daily.        Disposition and Follow-up: to see Dr [name] at [facility] 09/12/2016, labs at our office 6/18 and 6/21, visit 6/21 or 6/22  Significant Diagnostic Studies:  Nm Hepatobiliary Liver Func  Result Date: 08/26/2016 CLINICAL DATA:  Right upper quadrant pain EXAM: NUCLEAR MEDICINE HEPATOBILIARY IMAGING VIEWS: Anterior right upper quadrant RADIOPHARMACEUTICALS:  7.9 mCi Tc-64m  Choletec IV COMPARISON:  Ultrasound  right upper quadrant Aug 15, 2016 FINDINGS: Images were obtained serially over a 3 hour time span. Morphine was not administered due to history of allergic reaction to morphine. Liver uptake of radiotracer is inhomogeneous consistent with known liver metastases. Over a 3 hour time span, there is no appreciable visualization of either gallbladder or small bowel. IMPRESSION: Nonvisualization of gallbladder over a 3 hour time span is suggestive of cystic duct obstruction/ acute cholecystitis. Note, however, that there is also nonvisualization of small bowel after 3 hours. This finding potentially could be indicative of common bile duct obstruction. However, this finding also could be indicative of hepatic stasis phenomenon. Serum bilirubin is moderately elevated at this time. Known liver metastases demonstrated by inhomogeneous uptake of radiotracer. Given lack of visualization of both gallbladder and small bowel, it may be prudent to consider MRCP to assess for possible common bile duct lesion as the cause for lack of small bowel visualization on this study. Electronically Signed   By: Lowella Grip III M.D.   On: 08/26/2016 16:13   US Abdomen Complete  Result Date: 08/26/2016 CLINICAL DATA:  RIGHT upper quadrant pain beginning yesterday. Jaundice. History of metastatic breast cancer with known liver metastasis. EXAM: ABDOMEN ULTRASOUND COMPLETE COMPARISON:  Abdominal ultrasound Aug 15, 2016 and MRI of the abdomen July 24, 2016 FINDINGS: Gallbladder: 1.9 cm echogenic gallstone with acoustic shadowing surrounded by mildly echogenic sludge with increased through transmission. Mild gallbladder wall thickening without pericholecystic fluid. Sonographic Murphy's sign elicited. Common bile duct: Diameter: 5 mm Liver: Multiple heterogeneous liver mass is consistent with patient's known metastasis. Porta hepatis lymphadenopathy present though, better characterized on prior MRI. IVC: No abnormality visualized.  Pancreas: Visualized portion unremarkable. Spleen: Size and appearance within normal limits. Right Kidney: Length: 11.5 cm. Echogenicity within normal limits. No mass or hydronephrosis visualized. Left Kidney: Length: 11.8 cm. Echogenicity within normal limits. No mass or hydronephrosis visualized. Abdominal aorta: No aneurysm visualized. Other findings: None. IMPRESSION: Cholelithiasis and sonographic findings of acute cholecystitis. Numerous hepatic and nodal metastasis. Electronically Signed   By: Elon Alas M.D.   On: 08/26/2016 06:36   Mr 3d Recon At Scanner  Result Date: 08/29/2016 CLINICAL DATA:  Metastatic breast cancer with liver metastasis. Evaluate for metastasis versus biliary obstruction. New chemotherapy. Jaundice. EXAM: MRI ABDOMEN WITHOUT AND WITH CONTRAST (INCLUDING MRCP) TECHNIQUE: Multiplanar multisequence MR imaging of the abdomen was performed both before and after the administration of intravenous contrast. Heavily T2-weighted images of the biliary and pancreatic ducts were obtained, and three-dimensional MRCP images were rendered by post processing. CONTRAST:  35mL MULTIHANCE GADOBENATE DIMEGLUMINE 529 MG/ML IV SOLN COMPARISON:  Ultrasound 08/26/2016.  Most recent MRI of 07/24/2016. FINDINGS: Portions of exam are minimally motion degraded. Lower chest: Mild cardiomegaly. Left-sided pleural thickening remains. Index enhancing left pleural implant measures 1.4 cm on image 19/ series 11001. Compare 1.5 cm on the prior exam. More anterior implant measures 1.5 cm  on image 20/ series 1101 compare 1.4 cm on the prior. Hepatobiliary: Extensive hepatic metastasis. Overall mild to (given short interval ) moderate progression. Example central right hepatic lobe lesion at 3.3 x 3.8 cm on image 47/ series 11001. Compare 2.8 x 3.5 cm on the prior. Lateral segment left liver lobe lesion measures 3.1 cm on image 49/ series 11001. Compare 3.0 cm on the prior. A pericholecystic right liver lobe lesion  measures 4.7 x 4.0 cm on image 60/ series 11001. Compare 3.8 x 2.9 cm on the prior. Interval development of moderate intrahepatic biliary duct dilatation bilaterally. This continues to the level of the porta hepatis. Example image 89/series 4 and image 80/series 400. Amorphous soft tissue signal in this region is likely the cause, secondary to extension of porta hepatis infiltrative adenopathy. Example image 31/series 3 and image 49/ series 11002. A gallstone is again identified. There is moderate gallbladder wall thickening on image 36/series 3. The distal common duct is normal in caliber. Pancreas:  Normal, without mass or ductal dilatation. Spleen:  Normal in size, without focal abnormality. Adrenals/Urinary Tract: Normal adrenal glands. Normal kidneys, without hydronephrosis. Stomach/Bowel: Normal stomach and abdominal bowel loops. Vascular/Lymphatic: Aortic atherosclerosis. Circumaortic left renal vein. Porta hepatis adenopathy is progressive as detailed above. Example component in the portal caval space at 1.6 cm on image 31/series 3. Compare 1.4 cm on the prior. Progressive retroperitoneal adenopathy. A new node in the aortocaval space measures 1.3 cm on image 39/series 3. Other:  Trace perihepatic ascites. Musculoskeletal: Re- demonstration of osseous metastasis. Example lesion with the T11 vertebral body which measures 1.5 cm on image 22/series 12. Compare 1.4 cm on the prior. The inferior L3 lesion measures 2.8 x 1.6 cm on image 28/series 12 versus 2.9 x 1.8 cm on the prior. IMPRESSION: 1. Progression of hepatic metastasis. 2. Progressive adenopathy within the porta hepatis with increased retroperitoneal nodal metastasis. 3. Development of moderate intrahepatic biliary duct dilatation, secondary to soft tissue fullness in the porta hepatis, favored to related to infiltrative progressive adenopathy. Normal caliber common duct distally. 4. Cholelithiasis. Gallbladder wall thickening is nonspecific. Please  see prior ultrasound. 5. Similar osseous and left pleural metastasis. Findings were reported to Dr. Loletha Carrow at 1:35 p.m. Electronically Signed   By: Abigail Miyamoto M.D.   On: 08/29/2016 13:36   Ir Fluoro Guide Cv Line Left  Result Date: 09/05/2016 INDICATION: Metastatic breast cancer to liver, poor venous access; request made for central venous access . EXAM: LEFT UPPER EXTREMITY PICC LINE PLACEMENT WITH ULTRASOUND AND FLUOROSCOPIC GUIDANCE MEDICATIONS: None ANESTHESIA/SEDATION: None FLUOROSCOPY TIME:  Fluoroscopy Time:  42 seconds COMPLICATIONS: None immediate. PROCEDURE: The patient was advised of the possible risks and complications and agreed to undergo the procedure. The patient was then brought to the angiographic suite for the procedure. The left arm was prepped with chlorhexidine, draped in the usual sterile fashion using maximum barrier technique (cap and mask, sterile gown, sterile gloves, large sterile sheet, hand hygiene and cutaneous antisepsis) and infiltrated locally with 1% Lidocaine. Ultrasound demonstrated patency of the left brachial vein, and this was documented with an image. Under real-time ultrasound guidance, this vein was accessed with a 21 gauge micropuncture needle and image documentation was performed. A 0.018 wire was introduced in to the vein. Over this, a 5 [place] double lumen power injectable PICC was advanced to the lower SVC/right atrial junction. Fluoroscopy during the procedure and fluoro spot radiograph confirms appropriate catheter position. The catheter was flushed and covered with a sterile  dressing. Catheter length:  41 cm IMPRESSION: Successful left arm power PICC line placement with ultrasound and fluoroscopic guidance. The catheter is ready for use. Read by: Rowe Robert, PA-C Electronically Signed   By: Markus Daft M.D.   On: 09/05/2016 16:00   Ir US Guide Vasc Access Left  Result Date: 09/05/2016 INDICATION: Metastatic breast cancer to liver, poor venous access; request  made for central venous access . EXAM: LEFT UPPER EXTREMITY PICC LINE PLACEMENT WITH ULTRASOUND AND FLUOROSCOPIC GUIDANCE MEDICATIONS: None ANESTHESIA/SEDATION: None FLUOROSCOPY TIME:  Fluoroscopy Time:  42 seconds COMPLICATIONS: None immediate. PROCEDURE: The patient was advised of the possible risks and complications and agreed to undergo the procedure. The patient was then brought to the angiographic suite for the procedure. The left arm was prepped with chlorhexidine, draped in the usual sterile fashion using maximum barrier technique (cap and mask, sterile gown, sterile gloves, large sterile sheet, hand hygiene and cutaneous antisepsis) and infiltrated locally with 1% Lidocaine. Ultrasound demonstrated patency of the left brachial vein, and this was documented with an image. Under real-time ultrasound guidance, this vein was accessed with a 21 gauge micropuncture needle and image documentation was performed. A 0.018 wire was introduced in to the vein. Over this, a 5 [place] double lumen power injectable PICC was advanced to the lower SVC/right atrial junction. Fluoroscopy during the procedure and fluoro spot radiograph confirms appropriate catheter position. The catheter was flushed and covered with a sterile dressing. Catheter length:  41 cm IMPRESSION: Successful left arm power PICC line placement with ultrasound and fluoroscopic guidance. The catheter is ready for use. Read by: Rowe Robert, PA-C Electronically Signed   By: Markus Daft M.D.   On: 09/05/2016 16:00   Mr Abdomen Mrcp W Wo Contast  Result Date: 08/29/2016 CLINICAL DATA:  Metastatic breast cancer with liver metastasis. Evaluate for metastasis versus biliary obstruction. New chemotherapy. Jaundice. EXAM: MRI ABDOMEN WITHOUT AND WITH CONTRAST (INCLUDING MRCP) TECHNIQUE: Multiplanar multisequence MR imaging of the abdomen was performed both before and after the administration of intravenous contrast. Heavily T2-weighted images of the biliary and  pancreatic ducts were obtained, and three-dimensional MRCP images were rendered by post processing. CONTRAST:  31mL MULTIHANCE GADOBENATE DIMEGLUMINE 529 MG/ML IV SOLN COMPARISON:  Ultrasound 08/26/2016.  Most recent MRI of 07/24/2016. FINDINGS: Portions of exam are minimally motion degraded. Lower chest: Mild cardiomegaly. Left-sided pleural thickening remains. Index enhancing left pleural implant measures 1.4 cm on image 19/ series 11001. Compare 1.5 cm on the prior exam. More anterior implant measures 1.5 cm on image 20/ series 1101 compare 1.4 cm on the prior. Hepatobiliary: Extensive hepatic metastasis. Overall mild to (given short interval ) moderate progression. Example central right hepatic lobe lesion at 3.3 x 3.8 cm on image 47/ series 11001. Compare 2.8 x 3.5 cm on the prior. Lateral segment left liver lobe lesion measures 3.1 cm on image 49/ series 11001. Compare 3.0 cm on the prior. A pericholecystic right liver lobe lesion measures 4.7 x 4.0 cm on image 60/ series 11001. Compare 3.8 x 2.9 cm on the prior. Interval development of moderate intrahepatic biliary duct dilatation bilaterally. This continues to the level of the porta hepatis. Example image 89/series 4 and image 80/series 400. Amorphous soft tissue signal in this region is likely the cause, secondary to extension of porta hepatis infiltrative adenopathy. Example image 31/series 3 and image 49/ series 11002. A gallstone is again identified. There is moderate gallbladder wall thickening on image 36/series 3. The distal common duct is  normal in caliber. Pancreas:  Normal, without mass or ductal dilatation. Spleen:  Normal in size, without focal abnormality. Adrenals/Urinary Tract: Normal adrenal glands. Normal kidneys, without hydronephrosis. Stomach/Bowel: Normal stomach and abdominal bowel loops. Vascular/Lymphatic: Aortic atherosclerosis. Circumaortic left renal vein. Porta hepatis adenopathy is progressive as detailed above. Example component  in the portal caval space at 1.6 cm on image 31/series 3. Compare 1.4 cm on the prior. Progressive retroperitoneal adenopathy. A new node in the aortocaval space measures 1.3 cm on image 39/series 3. Other:  Trace perihepatic ascites. Musculoskeletal: Re- demonstration of osseous metastasis. Example lesion with the T11 vertebral body which measures 1.5 cm on image 22/series 12. Compare 1.4 cm on the prior. The inferior L3 lesion measures 2.8 x 1.6 cm on image 28/series 12 versus 2.9 x 1.8 cm on the prior. IMPRESSION: 1. Progression of hepatic metastasis. 2. Progressive adenopathy within the porta hepatis with increased retroperitoneal nodal metastasis. 3. Development of moderate intrahepatic biliary duct dilatation, secondary to soft tissue fullness in the porta hepatis, favored to related to infiltrative progressive adenopathy. Normal caliber common duct distally. 4. Cholelithiasis. Gallbladder wall thickening is nonspecific. Please see prior ultrasound. 5. Similar osseous and left pleural metastasis. Findings were reported to Dr. Loletha Carrow at 1:35 p.m. Electronically Signed   By: Abigail Miyamoto M.D.   On: 08/29/2016 13:36   Ir Int Lianne Cure Biliary Drain With Cholangiogram  Result Date: 09/06/2016 INDICATION: History of metastatic breast cancer, now with malignant obstructive jaundice. Request made for placement of percutaneous biliary drainage catheter in hopes of optimizing liver function tests to improve patient's candidacy for potential Y 90 or additional systemic therapies. EXAM: ULTRASOUND AND FLUOROSCOPIC GUIDED PERCUTANEOUS TRANSHEPATIC CHOLANGIOGRAM AND BILIARY TUBE PLACEMENT COMPARISON:  CT abdomen pelvis - 09/05/2016; abdominal MRI - 08/29/2016 MEDICATIONS: Zosyn 3.375 g IV ; the antibiotic was administered with an appropriate time frame prior to the initiation of the procedure. CONTRAST:  50mL ISOVUE-300 IOPAMIDOL (ISOVUE-300) INJECTION 61% - administered into the biliary tree ANESTHESIA/SEDATION: Moderate  (conscious) sedation was employed during this procedure. A total of Versed 5 mg and Fentanyl 250 mcg was administered intravenously. Moderate Sedation Time: 57 minutes. The patient's level of consciousness and vital signs were monitored continuously by radiology nursing throughout the procedure under my direct supervision. FLUOROSCOPY TIME:  10 minutes 54 seconds (623 mGy) COMPLICATIONS: None immediate. TECHNIQUE: Informed written consent was obtained from the patient after a discussion of the risks, benefits and alternatives to treatment. Questions regarding the procedure were encouraged and answered. A timeout was performed prior to the initiation of the procedure. The midline of the upper abdominal quadrant was prepped and draped in the usual sterile fashion, and a sterile drape was applied covering the operative field. Maximum barrier sterile technique with sterile gowns and gloves were used for the procedure. A timeout was performed prior to the initiation of the procedure. Under direct ultrasound guidance, a dilated duct within the peripheral aspect of the left lobe of the liver was accessed with a [place] needle. Contrast injection confirmed appropriate positioning. A Nitrex wire was advanced to the level of the malignant obstruction at the level the biliary hilum. Despite prolonged efforts, the inner 3 French catheter from the [place] set could not be advanced into the biliary tree given the cranial location of the left lobe of the liver as well as the angulation of the accessed duct. As such, the more peripheral aspect of the dominant dilated duct within the left lobe of the liver was accessed  with an additional [place] needle. A Nitrex wire was advanced to the level of the malignant obstruction at the level of the biliary hilum. Again, there is difficulty advancing the inner 3 French catheter from the [place] set and as such, a stiff micro puncture sheath sheath was utilized to secure the  access in to biliary tree and allowed for placement of an Amplatz wire which was coiled within the biliary hilum. Under intermittent fluoroscopic guidance, the micropuncture sheath was exchanged for a a 4 French angled glide catheter and ultimately, a Kumpe catheter, which was utilized to manipulate the Amplatz wire through the common bile duct to the level of the duodenum. Contrast injection confirmed appropriate positioning. The Kumpe catheter was utilized for measurement purposes. Additional sideholes were cut approximately 2 cm peripheral to the radiopaque marker of an 8 French percutaneous biliary drainage catheter. Under intermittent fluoroscopic guidance, an 8 French percutaneous biliary drainage catheter was advanced over an Amplatz wire with tip ultimately coiled within the descending duodenum. Contrast injection was performed demonstrating appropriate positioning and functionality of the biliary drainage catheter. Multiple spot fluoroscopic radiographic image were obtained in various obliquities. The catheter was connected to a drainage bag which yielded the brisk return of clear bile. The catheter was secured to the skin with an interrupted suture. The patient tolerated the procedure well without immediate postprocedural complication. FINDINGS: Ultrasound scanning demonstrates several hypoechoic lesions within the left lobe of liver compatible with known hepatic metastatic disease. Moderate intrahepatic biliary duct dilatation was again demonstrated within the left lobe of the liver Ultimately, under direct ultrasound guidance, a peripherally dilated duct in the left lobe of the liver was accessed allowing placement of a 8 French percutaneous biliary drainage catheter with and coiled and locked within the duodenum. Note, an 8 [place] percutaneous drain was placed in lieu of a 10 [place] as there was difficulty advancing the [place] set due to the cranial location of the left lobe of the liver and required  angulation of the access bile duct. IMPRESSION: Successful placement of an 8 French percutaneous biliary drainage catheter with end coiled and locked within the duodenum. PLAN: - Patient will be followed with daily CMP levels in hopes of improvement in serum bilirubin levels. - Pending the trend of the patient's LFTs, the patient will likely return for repeat cholangiogram and potential biliary drainage catheter exchange and up sizing with potential capping later this week. - Ultimately, the patient's bilirubin will need to improve 2 to be a candidate for Y 90 Radioembolization. Electronically Signed   By: Sandi Mariscal M.D.   On: 09/06/2016 11:15   Ct Angio Abd/pel W/ And/or W/o  Result Date: 09/05/2016 CLINICAL DATA:  History of metastatic breast cancer with progressive hepatic metastatic disease. Please perform CTA for evaluation of candidacy for Y 90 radioembolization. EXAM: CTA ABDOMEN AND PELVIS WITH CONTRAST TECHNIQUE: Multidetector CT imaging of the abdomen and pelvis was performed using the standard protocol during bolus administration of intravenous contrast. Multiplanar reconstructed images and MIPs were obtained and reviewed to evaluate the vascular anatomy. CONTRAST:  100 cc Isovue 370 COMPARISON:  Abdominal MRI - 08/29/2016 ; 07/24/2016; PET-CT -05/14/2016 FINDINGS: VASCULAR Aorta: Minimal amount of atherosclerotic plaque within a normal caliber abdominal aorta, not resulting in hemodynamically significant stenosis. No abdominal aortic dissection or periaortic stranding. Celiac: Widely patent. Conventional takeoff of the GDA. Note is made of separate origins of the left lateral hepatic segmental artery with middle hepatic artery which supplies the medial segment of  the left lobe of the liver as well as a portion of the anterior segment of the right lobe of the liver. Otherwise, conventional branching pattern. The right gastric artery is not definitely identified. SMA: Widely patent.  Conventional  branching pattern. Renals: Solitary bilaterally. The bilateral renal arteries are widely patent without hemodynamically significant narrowing. No vessel irregularity to suggest FMD. IMA: Widely patent. Inflow: The bilateral common, external and internal iliac artery is are tortuous lobe normal caliber and widely patent without hemodynamically significant stenosis. Proximal Outflow: The bilateral common, superficial and deep femoral arteries appear widely patent throughout their imaged course. Veins: The IVC and pelvic venous system appears widely patent. Review of the MIP images confirms the above findings. NON-VASCULAR Lower chest: Limited visualization of the lower thorax demonstrates consolidative opacities within the imaged left lower lobe with associated volume loss and trace left-sided pleural effusion. Known hypermetabolic nodule within the medial basilar aspect the left pleural space is grossly unchanged measuring 1.5 cm in diameter (image 8, series 5). No new focal airspace opacities. Normal heart size.  No pericardial effusion. Hepatobiliary: Normal hepatic contour, however loculated fluid/bile is noted about the subcapsular aspect of the right lobe of the liver (image 29, series 16), similar to recent abdominal MRI. Re- demonstrated extensive hepatic metastatic disease, similar to recently obtained abdominal MRI with dominant lesion within the dome of the right lobe of liver measuring approximately 2.6 x 2.6 cm (image 24, series 6 dominant lesion within the subcapsular aspect the right lobe of the liver measuring approximately 3.0 x 3.7 cm (image 42, series 6) and dominant lesion within the subcapsular aspect the right lobe of the liver measuring approximately 7.5 x 4.2 cm (image 66). Re- demonstrated intrahepatic biliary duct dilatation with occlusion of the biliary system at the level the hilum. The main portal vein is tapered/narrowed at the level of the biliary hilum though needs patent. A laminated  approximately 1.6 cm gallstone is noted within the fundus of the gallbladder (image 86, series 4) with associated minimal amount of pericholecystic stranding. Pancreas: Normal appearance of the pancreas Spleen: Normal appearance of the spleen Adrenals/Urinary Tract: There is symmetric enhancement and excretion of the bilateral kidneys. No definite renal stones this postcontrast examination. No discrete renal lesions. No urine obstruction or perinephric stranding. Normal appearance the bilateral adrenal glands. Normal appearance of the urinary bladder given degree distention. Stomach/Bowel: Scattered colonic diverticulosis without evidence of diverticulitis. Normal appearance of the terminal ileum. The appendix is not visualized, however there is no pericecal inflammatory change. No pneumoperitoneum, pneumatosis or portal venous gas. Lymphatic: Compared to PET-CT performed 05/14/2016, there has been development of porta hepatis and retroperitoneal lymphadenopathy with index left-sided periaortic lymph node measuring 1.3 cm in greatest short axis diameter (image 70, series 4), index aortocaval lymph nodes measuring 1.2 cm (image 79) and 1.1 cm (image 100) and index gastrohepatic lymph ligament lymph node measuring 0.9 cm (image 46, series 4 Reproductive: Normal appearance of the pelvic organs. Small amount of free fluid in the pelvic cul-de-sac. Other: Regional soft tissues appear normal. Musculoskeletal: Moderate scoliotic curvature of the thoracolumbar spine with dominant caudal component convex to the left measuring approximately 30 degrees (as measured from the superior endplate of H96 to the inferior endplate of L4. Similar appearance of known pathologic fracture involving the right inferior pubic ramus (image 205, series 4). Approximately 1.3 x 1.5 cm sclerotic lesion within the right-side of the T11 vertebral body is unchanged (image 15, series 16). Potential new approximately 1.8 x  1.7 cm lytic lesion  involving the posterior aspects of the L3 vertebral body (sagittal image 115, series 9). IMPRESSION: VASCULAR 1. Minimal amount of atherosclerotic plaque with a normal caliber abdominal aorta. Aortic Atherosclerosis (ICD10-I70.0). 2. Conventional anatomy amenable to Y 90 radioembolization. NON-VASCULAR 1. Compared to recent obtain abdominal MRI, unchanged extensive hepatic metastatic disease, primarily affecting the right lobe of the liver with obstruction of the biliary system at the level the hilum with associated intrahepatic biliary duct dilatation affecting both the right and left lobes of the liver. 2. Compared to PET-CT performed 05/14/2016, interval development of porta hepatis and retroperitoneal lymphadenopathy as detailed above. 3. Grossly unchanged metastatic disease within the imaged left lower lung / pleural as detailed above. 4. Potential new approximately 1.8 cm lytic lesion involving the posterior aspect of the L3 vertebral body. Further evaluation lumbar spine MRI could performed as clinically indicated. Electronically Signed   By: Sandi Mariscal M.D.   On: 09/05/2016 16:57   US Abdomen Limited Ruq  Result Date: 08/15/2016 CLINICAL DATA:  Acute right upper quadrant pain, metastatic breast cancer to the liver EXAM: US ABDOMEN LIMITED - RIGHT UPPER QUADRANT COMPARISON:  07/24/2016 FINDINGS: Gallbladder: Echogenic shadowing gallstone noted measuring 1.7 cm. Normal wall thickness measuring 2.4 mm. No Murphy's sign. No pericholecystic fluid. Negative for cholecystitis by ultrasound. Common bile duct: Diameter: 4.6 mm Liver: Numerous solid hepatic metastases again evident, largest lesions are in the right lobe measuring up to 7.8 cm in diameter. No biliary dilatation or obstruction.  No ascites. IMPRESSION: Cholelithiasis. Negative for acute cholecystitis or biliary obstruction Extensive hepatic metastases Electronically Signed   By: Jerilynn Mages.  Shick M.D.   On: 08/15/2016 11:22    Discharge Laboratory  Values: . CMP Latest Ref Rng & Units 09/10/2016 09/09/2016 09/08/2016  Glucose 65 - 99 mg/dL 84 93 95  BUN 6 - 20 mg/dL 12 13 13   Creatinine 0.44 - 1.00 mg/dL 0.37(L) 0.46 <0.30(L)  Sodium 135 - 145 mmol/L 127(L) 123(L) 126(L)  Potassium 3.5 - 5.1 mmol/L 3.6 3.8 3.7  Chloride 101 - 111 mmol/L 97(L) 94(L) 97(L)  CO2 22 - 32 mmol/L 22 20(L) 22  Calcium 8.9 - 10.3 mg/dL 6.6(L) 6.2(LL) 6.4(LL)  Total Protein 6.5 - 8.1 g/dL 4.5(L) 4.9(L) 5.0(L)  Total Bilirubin 0.3 - 1.2 mg/dL 13.6(H) 15.4(H) 16.2(H)  Alkaline Phos 38 - 126 U/L 334(H) 401(H) 530(H)  AST 15 - 41 U/L 155(H) 156(H) 157(H)  ALT 14 - 54 U/L 212(H) 254(H) 312(H)    CBC    Component Value Date/Time   WBC 17.6 (H) 09/10/2016 0506   RBC 2.53 (L) 09/10/2016 0506   HGB 8.6 (L) 09/10/2016 0506   HGB 11.7 09/05/2016 1148   HCT 25.0 (L) 09/10/2016 0506   HCT 34.1 (L) 09/05/2016 1148   PLT 373 09/10/2016 0506   PLT 469 (H) 09/05/2016 1148   MCV 98.8 09/10/2016 0506   MCV 101.2 (H) 09/05/2016 1148   MCH 34.0 09/10/2016 0506   MCHC 34.4 09/10/2016 0506   RDW 16.9 (H) 09/10/2016 0506   RDW 16.9 (H) 09/05/2016 1148   LYMPHSABS 0.4 (L) 09/10/2016 0506   LYMPHSABS 1.3 09/05/2016 1148   MONOABS 1.5 (H) 09/10/2016 0506   MONOABS 0.8 09/05/2016 1148   EOSABS 0.1 09/10/2016 0506   EOSABS 0.1 09/05/2016 1148   EOSABS 0.2 11/02/2007 1116   BASOSABS 0.0 09/10/2016 0506   BASOSABS 0.0 09/05/2016 1148     Brief H and P: For complete details please refer to admission H  and P, but in brief, [name] [name] was admitted for biliary catheter placement and pain control in the setting of multiple liver metastases from stage IV breast cancer. She underwent the procedure 09/06/2016 and her t bil has been dropping since. Her pain is now better controlled on toradol and tramadol. She has anasarca secondary to low albumin, and has had problems with hypocalcemia and hyponatremia. There have been addressed with fluid retention, calcium supplementation and, for  the anasarca, a sodium-free diet and compression stockings. She will weigh herself daily and call for a change of 5 lbs from baseline. She remains constipated secondary to pain medications but has a bowel prophylaxis regimen written out-- I gave her instrauctions in writing prior to discharge  Physical Exam at Discharge: BP 136/67 (BP Location: Left Leg)   Pulse 89   Temp 98 F (36.7 C) (Oral)   Resp 18   Ht 5\' 4"  (1.626 m)   Wt 160 lb (72.6 kg)   SpO2 99%   BMI 27.46 kg/m  Gen: middle aged white woman exmained in recliner Cardiovascular: RRR, no murmur appreciated Respiratory:clear Gastrointestinal:slight distension, not terse, +BS, NT Extremities:grade 1-2 lower extremily edema    Hospital Course:  Active Problems:   Cholestasis, intrahepatic See H&P above. At the time of discharge the patient is ambulatory, her pain is controlled on her current medications, her sodium and calcium problems have been stabilized, she has instructions on catheter management and will have HHN flush both her lines (PICC and biliary drainage) daily, she is on fluid restriction and a no sodium diet-- with all these instructions given to her in writing  Diet:  No sodium; 1.2 L fluid restriction  Activity:  As tolerated  Condition at Discharge:   improved  Signed: Dr. Lurline Del [phone]  09/11/2016, 8:01 AM

## 2016-09-11 NOTE — Telephone Encounter (Signed)
Tawni Levy rn of [facility] called to clarify pt needed her toradol called into pharmacy. S/w Dr Jana Hakim and called in [place].

## 2016-09-11 NOTE — Telephone Encounter (Signed)
Confirmed next weeks appt 6/18 and  6/21 per sch msg

## 2016-09-11 NOTE — Care Management Note (Signed)
Case Management Note  Patient Details  Name: CALLEEN ALVIS MRN: [id number] Date of Birth: [birth year]  Subjective/Objective: d/c home today w/[facility] HHRN-labs,picc flush-rep Maudie Mercury aware of d/c & orders. No further CM needs.                   Action/Plan:d/c home w/[facility].   Expected Discharge Date:  09/11/16               Expected Discharge Plan:  [place]  In-House Referral:     Discharge planning Services  CM Consult  Post Acute Care Choice:    Choice offered to:  Patient  DME Arranged:    DME Agency:     HH Arranged:  RN [place] Agency:  [place]  Status of Service:  Completed, signed off  If discussed at [place] of Stay Meetings, dates discussed:    Additional Comments:  Dessa Phi, RN 09/11/2016, 9:45 AM

## 2016-09-12 ENCOUNTER — Other Ambulatory Visit: Payer: 59

## 2016-09-12 ENCOUNTER — Ambulatory Visit: Payer: 59 | Admitting: Physical Therapy

## 2016-09-12 ENCOUNTER — Telehealth: Payer: Self-pay

## 2016-09-12 ENCOUNTER — Telehealth: Payer: Self-pay | Admitting: Oncology

## 2016-09-12 NOTE — Telephone Encounter (Addendum)
[name] [name] stated that Dr [name] in [place] called. D/t pt's high bilirubin she does not qualify for trials and she does not want to see her until things normalize. Pt was supposed to have labs drawn at [facility] today. Does she need labs today. S/w Dr Jana Hakim and Monday's appt is soon enough for labs.   Pt then discussed that she has a picc that needs flushing 3x/wk. She has a biliary drain that needs flushing daily.  She just got out of hospital yesterday and [place] did their intake today. [facility] has a policy of home visits 3x/week. They are looking into her case to see if they can see her daily. This has not been decided yet.   Her insurance allows her 1 visits.  She is asking if she needs to look into other home health agencies that do not have the 3x/week limit. She is asking for recommendations of agencies that have RN level home visits and are willling to bill her insurance.  Asked her to wait until she hears from [facility] if they can arrange daily visits.

## 2016-09-12 NOTE — Telephone Encounter (Signed)
Scheduled flush appts with labs per sch message from 6/15 - patient is aware of appts added - and times.

## 2016-09-14 LAB — BODY FLUID CULTURE: Special Requests: NORMAL

## 2016-09-15 ENCOUNTER — Other Ambulatory Visit: Payer: 59

## 2016-09-15 ENCOUNTER — Other Ambulatory Visit (HOSPITAL_BASED_OUTPATIENT_CLINIC_OR_DEPARTMENT_OTHER): Payer: 59

## 2016-09-15 ENCOUNTER — Ambulatory Visit: Payer: 59 | Admitting: Physical Therapy

## 2016-09-15 ENCOUNTER — Other Ambulatory Visit: Payer: Self-pay | Admitting: Oncology

## 2016-09-15 ENCOUNTER — Ambulatory Visit: Payer: 59

## 2016-09-15 DIAGNOSIS — C7951 Secondary malignant neoplasm of bone: Secondary | ICD-10-CM | POA: Diagnosis not present

## 2016-09-15 DIAGNOSIS — Z79899 Other long term (current) drug therapy: Secondary | ICD-10-CM | POA: Diagnosis not present

## 2016-09-15 DIAGNOSIS — C50211 Malignant neoplasm of upper-inner quadrant of right female breast: Secondary | ICD-10-CM

## 2016-09-15 DIAGNOSIS — C787 Secondary malignant neoplasm of liver and intrahepatic bile duct: Secondary | ICD-10-CM | POA: Diagnosis not present

## 2016-09-15 DIAGNOSIS — C771 Secondary and unspecified malignant neoplasm of intrathoracic lymph nodes: Secondary | ICD-10-CM | POA: Diagnosis not present

## 2016-09-15 DIAGNOSIS — Z170 Estrogen receptor positive status [ER+]: Principal | ICD-10-CM

## 2016-09-15 DIAGNOSIS — I890 Lymphedema, not elsewhere classified: Secondary | ICD-10-CM

## 2016-09-15 DIAGNOSIS — C50011 Malignant neoplasm of nipple and areola, right female breast: Secondary | ICD-10-CM | POA: Diagnosis not present

## 2016-09-15 DIAGNOSIS — M6289 Other specified disorders of muscle: Secondary | ICD-10-CM | POA: Diagnosis present

## 2016-09-15 DIAGNOSIS — M62838 Other muscle spasm: Secondary | ICD-10-CM

## 2016-09-15 DIAGNOSIS — R1031 Right lower quadrant pain: Secondary | ICD-10-CM

## 2016-09-15 LAB — TECHNOLOGIST REVIEW

## 2016-09-15 LAB — CBC WITH DIFFERENTIAL/PLATELET
BASO%: 0.6 % (ref 0.0–2.0)
BASOS ABS: 0.1 10*3/uL (ref 0.0–0.1)
EOS ABS: 0.1 10*3/uL (ref 0.0–0.5)
EOS%: 1.6 % (ref 0.0–7.0)
HEMATOCRIT: 25.2 % — AB (ref 34.8–46.6)
HEMOGLOBIN: 8.9 g/dL — AB (ref 11.6–15.9)
LYMPH#: 0.5 10*3/uL — AB (ref 0.9–3.3)
LYMPH%: 5.5 % — ABNORMAL LOW (ref 14.0–49.7)
MCH: 34.1 pg — ABNORMAL HIGH (ref 25.1–34.0)
MCHC: 35.3 g/dL (ref 31.5–36.0)
MCV: 96.6 fL (ref 79.5–101.0)
MONO#: 1 10*3/uL — AB (ref 0.1–0.9)
MONO%: 11.4 % (ref 0.0–14.0)
NEUT#: 6.8 10*3/uL — ABNORMAL HIGH (ref 1.5–6.5)
NEUT%: 80.9 % — ABNORMAL HIGH (ref 38.4–76.8)
NRBC: 0 % (ref 0–0)
Platelets: 428 10*3/uL — ABNORMAL HIGH (ref 145–400)
RBC: 2.61 10*6/uL — ABNORMAL LOW (ref 3.70–5.45)
RDW: 16.9 % — AB (ref 11.2–14.5)
WBC: 8.4 10*3/uL (ref 3.9–10.3)

## 2016-09-15 LAB — COMPREHENSIVE METABOLIC PANEL
ALBUMIN: 1.9 g/dL — AB (ref 3.5–5.0)
ALT: 249 U/L — ABNORMAL HIGH (ref 0–55)
AST: 273 U/L (ref 5–34)
Alkaline Phosphatase: 560 U/L — ABNORMAL HIGH (ref 40–150)
Anion Gap: 7 mEq/L (ref 3–11)
BUN: 11.9 mg/dL (ref 7.0–26.0)
CO2: 25 mEq/L (ref 22–29)
CREATININE: 0.7 mg/dL (ref 0.6–1.1)
Calcium: 9.3 mg/dL (ref 8.4–10.4)
Chloride: 92 mEq/L — ABNORMAL LOW (ref 98–109)
GLUCOSE: 87 mg/dL (ref 70–140)
Potassium: 4.7 mEq/L (ref 3.5–5.1)
SODIUM: 123 meq/L — AB (ref 136–145)
TOTAL PROTEIN: 4.8 g/dL — AB (ref 6.4–8.3)
Total Bilirubin: 18.65 mg/dL (ref 0.20–1.20)

## 2016-09-15 LAB — TSH: TSH: 6.458 m(IU)/L — ABNORMAL HIGH (ref 0.308–3.960)

## 2016-09-15 MED ORDER — TRIAMTERENE-HCTZ 37.5-25 MG PO CAPS: 1.0000 | ORAL_CAPSULE | Freq: Every day | ORAL | 6 refills | Status: DC | PRN

## 2016-09-15 MED ORDER — BETAMETHASONE VALERATE 0.1 % EX OINT: 1.0000 "application " | TOPICAL_OINTMENT | Freq: Two times a day (BID) | CUTANEOUS | 0 refills | Status: AC

## 2016-09-15 NOTE — Therapy (Signed)
[place], [place], [address] Phone: [phone]   Fax:  [phone]  Physical Therapy Treatment  Patient Details  Name: [name] [name] MRN: [id number] Date of Birth: 11-08-1960 Referring Provider: Dr. Kyung Rudd  Encounter Date: 09/15/2016      PT End of Session - 09/15/16 1537    Visit Number 8  44 for lymph   Number of Visits 68  for lymphedema   Date for PT Re-Evaluation 12/08/16   Authorization - Number of Visits 140   PT Start Time 1020   PT Stop Time 1103   PT Time Calculation (min) 43 min   Activity Tolerance Patient tolerated treatment well   Behavior During Therapy [facility] for tasks assessed/performed      Past Medical History:  Diagnosis Date  . Bone metastases ([place]) dx'd 05/2014  . Breast cancer ([place]) dx'd 2005/2011  . Peripheral vascular disease ([place]) 02/2010   blood clot related to porta cath  . PONV (postoperative nausea and vomiting)   . S/P radiation therapy 07/17/2014 through 08/02/2014    Left mediastinum, left seventh rib 3250 cGy in 13 sessions   . S/P radiation therapy 12/11/2014 through 12/22/2014    Left parietal calvarium 2400 cGy in 8 sessions   . Seizures ([place]) 2010   Isolated incident.    Past Surgical History:  Procedure Laterality Date  . AXILLARY LYMPH NODE DISSECTION  Dec. 2011  . BREAST LUMPECTOMY  2005  . IR FLUORO GUIDE CV LINE LEFT  09/05/2016  . IR GENERIC HISTORICAL  05/21/2016   IR RADIOLOGIST EVAL & MGMT 05/21/2016 Sandi Mariscal, MD [facility] INTERV RAD  . IR INT EXT BILIARY DRAIN WITH CHOLANGIOGRAM  09/06/2016  . IR US GUIDE VASC ACCESS LEFT  09/05/2016  . MEDIASTINOTOMY CHAMBERLAIN MCNEIL Left 06/02/2013   Procedure: MEDIASTINOTOMY CHAMBERLAIN MCNEIL;  Surgeon: [name], MD;  Location: [place];   Service: Thoracic;  Laterality: Left;  LEFT ANTERIOR MEDIASTINOTOMY   . PORTACATH PLACEMENT  12/11  . removal portacath      There were no vitals filed for this visit.      Subjective Assessment - 09/15/16 1018    Subjective "I am miserable, just miserable.  I think the albumin's the same--it's causing all the swelling."  Left hand is swelling and staying swollen.  I'm just sick feeling all day long.  The pain is under control."                         [facility] Adult PT Treatment/Exercise - 09/15/16 0001      Manual Therapy   Manual Lymphatic Drainage (MLD) In supine (with head of bed elevated and legs elevated), short neck and anterior interaxillary anastomosis, right upper outer breast directing toward this anastomosis, and right UE from fingers to shoulder.  In right sidelying, posterior interaxillary anastomosis right to left.   Other Manual Therapy In right sidelying, neck and upper back soft tissue work for muscle relaxation and pain relief.                  PT Short Term Goals - 02/12/16 1226      PT SHORT TERM GOAL #1   Title pain with walking decreased >/= 25%   Time 4   Period Weeks   Status Achieved           PT Long Term Goals - 08/05/16 1318      PT  LONG TERM GOAL #1   Title indpendent with HEP   Time 8   Period Weeks   Status On-going     PT LONG TERM GOAL #2   Title pain with walking decreased >/= 75%   Time 8   Period Weeks   Status On-going     PT LONG TERM GOAL #3   Title ability to flex her right hip with right groin pain decreased >/= 50% due to improved tissue mobility   Time 8   Period Weeks   Status Achieved     PT LONG TERM GOAL #4   Title waking up in middle of night with pain decreased >/=50%   Time 8   Period Weeks   Status Achieved     PT LONG TERM GOAL #5   Title reduction of pain by end of day >/= 50% due to increase tissue mobility   Time 8   Period Weeks   Status On-going           Long Term  Clinic Goals - 09/15/16 1543      CC Long Term Goal  #2   Title Pt. will report swelling is adequately managed to enable ADL function at a consistent level.   Baseline This refers to her right breast/axilla/UE lymphedema and not the other swelling that is current.   Status On-going     CC Long Term Goal  #4   Title Pain/discomfort at right axilla area will be controlled at 6/10 or less.   Status On-going     CC Long Term Goal  #5   Title Patient will avoid infection by ongoing management of her lymphedema at right breast/axilla/upper arm areas.   Status On-going            Plan - 09/15/16 1540    Clinical Impression Statement Patient now has significant swelling in both legs and abdomen, mild swelling in left arm as well as her lymphedema at right upper outer breast area and right UE, especially hand.  She is jaundiced. She was in the hospital recently for a procedure to place a tube in her liver for drainage.  She feels miserable.  Therapy still helps her feel better and continues to help her function.   Rehab Potential Good   Clinical Impairments Affecting Rehab Potential active cancer   PT Frequency 2x / week   PT Duration 12 weeks   PT Treatment/Interventions ADLs/Self Care Home Management;Therapeutic activities;Therapeutic exercise;Patient/family education;Manual techniques;Manual lymph drainage;Scar mobilization   PT Next Visit Plan manual lymph drainage, soft tissue, and myofascial release   Consulted and Agree with Plan of Care Patient      Patient will benefit from skilled therapeutic intervention in order to improve the following deficits and impairments:  Increased edema, Increased fascial restricitons, Pain  Visit Diagnosis: Lymphedema, not elsewhere classified - Plan: PT plan of care cert/re-cert  Other muscle spasm - Plan: PT plan of care cert/re-cert  Groin pain, right - Plan: PT plan of care cert/re-cert  Muscle stiffness - Plan: PT plan of care  cert/re-cert     Problem List Patient Active Problem List   Diagnosis Date Noted  . Cholestasis, intrahepatic 09/05/2016  . Cholelithiasis   . Jaundice   . RUQ pain 08/26/2016  . Liver metastases ([place]) 02/23/2016  . 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 Zoster 04/04/2015  . Nausea with vomiting 11/18/2014  . Constipation 11/18/2014  . Left-sided thoracic back pain   . Bone metastases ([place])  11/16/2014  . Back pain 11/15/2014  . Uncontrolled pain 11/14/2014  . Post-lymphadenectomy lymphedema of arm 05/31/2014  . Chest wall pain 03/21/2014  . Abnormal LFTs (liver function tests) 09/12/2013  . Malignant neoplasm of upper-inner quadrant of right breast in female, estrogen receptor positive ([place]) 08/18/2013  . 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

## 2016-09-15 NOTE — Progress Notes (Signed)
The cell was seen in the clinic today very distressed because she has developed some redness associated with her PICC line. This does not appear to be a bruise or cellulitis but more a reactive rash. She also continues to experience more swelling despite a very strict sodium diet and water restriction. She gained 2 pounds yesterday. She is taking calcium twice a day to try to keep that up since this was a very low in the hospital and she has now normalized that. Her albumin of course continues to drop and her total bilirubin unfortunately is back up to 18.65. The alkaline phosphatase is 560. The transaminases are 273 and 249.  She was supposed to have met with Dr. Dedra Skeens at [facility] but given the hyperbilirubinemia she does not qualify for any study so that visit has been canceled for the time being.  These I will see me again in 3 days. We will repeat her liver function tests at that time. There is a possibility of further procedures under Dr. Pascal Lux but if there is no improvement in the bilirubin and transaminases there is not going to be any for further possibility of therapy. She is aware of this.

## 2016-09-16 ENCOUNTER — Other Ambulatory Visit: Payer: Self-pay | Admitting: Oncology

## 2016-09-16 ENCOUNTER — Other Ambulatory Visit (HOSPITAL_COMMUNITY): Payer: Self-pay | Admitting: Interventional Radiology

## 2016-09-16 ENCOUNTER — Telehealth: Payer: Self-pay | Admitting: Oncology

## 2016-09-16 ENCOUNTER — Other Ambulatory Visit: Payer: Self-pay | Admitting: Radiology

## 2016-09-16 ENCOUNTER — Other Ambulatory Visit: Payer: 59

## 2016-09-16 DIAGNOSIS — K831 Obstruction of bile duct: Secondary | ICD-10-CM

## 2016-09-16 DIAGNOSIS — C801 Malignant (primary) neoplasm, unspecified: Secondary | ICD-10-CM

## 2016-09-16 DIAGNOSIS — C50919 Malignant neoplasm of unspecified site of unspecified female breast: Secondary | ICD-10-CM

## 2016-09-16 NOTE — Telephone Encounter (Signed)
sw pt husband to confirm 6/22 appt at 1115 per sch msg

## 2016-09-16 NOTE — Progress Notes (Signed)
Pt came in to clinic for lab draw with stated concern with PICC line - noted redness, with with slight tenderness on inside of arm. No raised rash noted - with area seeming to be more ecchymotic. Pt very concerned that she is having a reaction to dressing.  Dressing changed by this RN with noted dressing a form of OPSITE with a tegaderm border. This RN placed a tegaderm only dressing with additional paper tape to edges. Biopatch not applied due to possible reaction concern.   This RN contacted [facility] and dressing will be changed again on 09/16/2016 for assessment.

## 2016-09-16 NOTE — Therapy (Signed)
[place], [place], [address] Phone: [phone]   Fax:  [phone]  Physical Therapy Treatment  Patient Details  Name: [name] [name] MRN: [id number] Date of Birth: 04/02/1960 Referring Provider: Dr. Kyung Rudd  Encounter Date: 09/15/2016      PT End of Session - 09/15/16 1537    Visit Number 17  44 for lymph   Number of Visits 68  for lymphedema   Date for PT Re-Evaluation 12/08/16   Authorization - Number of Visits 140   PT Start Time 1020   PT Stop Time 1103   PT Time Calculation (min) 43 min   Activity Tolerance Patient tolerated treatment well   Behavior During Therapy [facility] for tasks assessed/performed      Past Medical History:  Diagnosis Date  . Bone metastases ([place]) dx'd 05/2014  . Breast cancer ([place]) dx'd 2005/2011  . Peripheral vascular disease ([place]) 02/2010   blood clot related to porta cath  . PONV (postoperative nausea and vomiting)   . S/P radiation therapy 07/17/2014 through 08/02/2014    Left mediastinum, left seventh rib 3250 cGy in 13 sessions   . S/P radiation therapy 12/11/2014 through 12/22/2014    Left parietal calvarium 2400 cGy in 8 sessions   . Seizures ([place]) 2010   Isolated incident.    Past Surgical History:  Procedure Laterality Date  . AXILLARY LYMPH NODE DISSECTION  Dec. 2011  . BREAST LUMPECTOMY  2005  . IR FLUORO GUIDE CV LINE LEFT  09/05/2016  . IR GENERIC HISTORICAL  05/21/2016   IR RADIOLOGIST EVAL & MGMT 05/21/2016 Sandi Mariscal, MD [facility] INTERV RAD  . IR INT EXT BILIARY DRAIN WITH CHOLANGIOGRAM  09/06/2016  . IR US GUIDE VASC ACCESS LEFT  09/05/2016  . MEDIASTINOTOMY CHAMBERLAIN MCNEIL Left 06/02/2013   Procedure: MEDIASTINOTOMY CHAMBERLAIN MCNEIL;  Surgeon: [name], MD;  Location: [place];   Service: Thoracic;  Laterality: Left;  LEFT ANTERIOR MEDIASTINOTOMY   . PORTACATH PLACEMENT  12/11  . removal portacath      There were no vitals filed for this visit.      Subjective Assessment - 09/15/16 1018    Subjective "I am miserable, just miserable.  I think the albumin's the same--it's causing all the swelling."  Left hand is swelling and staying swollen.  I'm just sick feeling all day long.  The pain is under control."                                   PT Short Term Goals - 02/12/16 1226      PT SHORT TERM GOAL #1   Title pain with walking decreased >/= 25%   Time 4   Period Weeks   Status Achieved           PT Long Term Goals - 08/05/16 1318      PT LONG TERM GOAL #1   Title indpendent with HEP   Time 8   Period Weeks   Status On-going     PT LONG TERM GOAL #2   Title pain with walking decreased >/= 75%   Time 8   Period Weeks   Status On-going     PT LONG TERM GOAL #3   Title ability to flex her right hip with right groin pain decreased >/= 50% due to improved tissue mobility   Time 8   Period  Weeks   Status Achieved     PT LONG TERM GOAL #4   Title waking up in middle of night with pain decreased >/=50%   Time 8   Period Weeks   Status Achieved     PT LONG TERM GOAL #5   Title reduction of pain by end of day >/= 50% due to increase tissue mobility   Time 8   Period Weeks   Status On-going           Long Term Clinic Goals - 09/15/16 1543      CC Long Term Goal  #2   Title Pt. will report swelling is adequately managed to enable ADL function at a consistent level.   Baseline This refers to her right breast/axilla/UE lymphedema and not the other swelling that is current.   Status On-going     CC Long Term Goal  #4   Title Pain/discomfort at right axilla area will be controlled at 6/10 or less.   Status On-going     CC Long Term Goal  #5   Title Patient will avoid infection by ongoing management of her  lymphedema at right breast/axilla/upper arm areas.   Status On-going            Plan - 09/15/16 1540    Clinical Impression Statement Patient now has significant swelling in both legs and abdomen, mild swelling in left arm as well as her lymphedema at right upper outer breast area and right UE, especially hand.  She is jaundiced. She was in the hospital recently for a procedure to place a tube in her liver for drainage.  She feels miserable.  Therapy still helps her feel better and continues to help her function.   Rehab Potential Good   Clinical Impairments Affecting Rehab Potential active cancer   PT Frequency 2x / week   PT Duration 12 weeks   PT Treatment/Interventions ADLs/Self Care Home Management;Therapeutic activities;Therapeutic exercise;Patient/family education;Manual techniques;Manual lymph drainage;Scar mobilization   PT Next Visit Plan manual lymph drainage, soft tissue, and myofascial release   Consulted and Agree with Plan of Care Patient      Patient will benefit from skilled therapeutic intervention in order to improve the following deficits and impairments:  Increased edema, Increased fascial restricitons, Pain  Visit Diagnosis: Lymphedema, not elsewhere classified - Plan: PT plan of care cert/re-cert  Other muscle spasm - Plan: PT plan of care cert/re-cert  Groin pain, right - Plan: PT plan of care cert/re-cert  Muscle stiffness - Plan: PT plan of care cert/re-cert     Problem List Patient Active Problem List   Diagnosis Date Noted  . Cholestasis, intrahepatic 09/05/2016  . Cholelithiasis   . Jaundice   . RUQ pain 08/26/2016  . Liver metastases ([place]) 02/23/2016  . 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 Zoster 04/04/2015  . Nausea with vomiting 11/18/2014  . Constipation 11/18/2014  . Left-sided thoracic back pain   . Bone metastases ([place]) 11/16/2014  . Back pain 11/15/2014  . Uncontrolled pain 11/14/2014  . Post-lymphadenectomy lymphedema  of arm 05/31/2014  . Chest wall pain 03/21/2014  . Abnormal LFTs (liver function tests) 09/12/2013  . Malignant neoplasm of upper-inner quadrant of right breast in female, estrogen receptor positive ([place]) 08/18/2013  . 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

## 2016-09-17 ENCOUNTER — Ambulatory Visit: Payer: 59 | Admitting: Oncology

## 2016-09-18 ENCOUNTER — Other Ambulatory Visit: Payer: 59

## 2016-09-18 ENCOUNTER — Observation Stay: Admission: AD | Admit: 2016-09-18 | Payer: 59 | Source: Ambulatory Visit | Admitting: Oncology

## 2016-09-18 ENCOUNTER — Observation Stay (HOSPITAL_COMMUNITY)
Admission: RE | Admit: 2016-09-18 | Discharge: 2016-09-21 | Disposition: A | Discharge status: Home / Self Care | Payer: 59 | Source: Ambulatory Visit | Attending: Interventional Radiology | Admitting: Interventional Radiology

## 2016-09-18 ENCOUNTER — Encounter (HOSPITAL_COMMUNITY): Payer: Self-pay

## 2016-09-18 ENCOUNTER — Ambulatory Visit (HOSPITAL_COMMUNITY)
Admission: RE | Admit: 2016-09-18 | Discharge: 2016-09-18 | Disposition: A | Discharge status: Home / Self Care | Payer: 59 | Source: Ambulatory Visit | Attending: Interventional Radiology | Admitting: Interventional Radiology

## 2016-09-18 ENCOUNTER — Ambulatory Visit: Payer: 59 | Admitting: Oncology

## 2016-09-18 VITALS — BP 154/91 | HR 87 | Temp 97.7°F | Resp 20 | Ht 64.0 in | Wt 176.0 lb

## 2016-09-18 DIAGNOSIS — E871 Hypo-osmolality and hyponatremia: Secondary | ICD-10-CM | POA: Diagnosis not present

## 2016-09-18 DIAGNOSIS — R52 Pain, unspecified: Secondary | ICD-10-CM | POA: Diagnosis not present

## 2016-09-18 DIAGNOSIS — Z888 Allergy status to other drugs, medicaments and biological substances status: Secondary | ICD-10-CM | POA: Diagnosis not present

## 2016-09-18 DIAGNOSIS — I700 Atherosclerosis of aorta: Secondary | ICD-10-CM | POA: Diagnosis not present

## 2016-09-18 DIAGNOSIS — E440 Moderate protein-calorie malnutrition: Secondary | ICD-10-CM | POA: Insufficient documentation

## 2016-09-18 DIAGNOSIS — K8020 Calculus of gallbladder without cholecystitis without obstruction: Secondary | ICD-10-CM | POA: Diagnosis present

## 2016-09-18 DIAGNOSIS — C787 Secondary malignant neoplasm of liver and intrahepatic bile duct: Secondary | ICD-10-CM | POA: Diagnosis present

## 2016-09-18 DIAGNOSIS — K7689 Other specified diseases of liver: Secondary | ICD-10-CM | POA: Diagnosis present

## 2016-09-18 DIAGNOSIS — K8001 Calculus of gallbladder with acute cholecystitis with obstruction: Principal | ICD-10-CM | POA: Insufficient documentation

## 2016-09-18 DIAGNOSIS — R945 Abnormal results of liver function studies: Secondary | ICD-10-CM

## 2016-09-18 DIAGNOSIS — C771 Secondary and unspecified malignant neoplasm of intrathoracic lymph nodes: Secondary | ICD-10-CM | POA: Diagnosis present

## 2016-09-18 DIAGNOSIS — Z9889 Other specified postprocedural states: Secondary | ICD-10-CM | POA: Insufficient documentation

## 2016-09-18 DIAGNOSIS — R0789 Other chest pain: Secondary | ICD-10-CM | POA: Diagnosis present

## 2016-09-18 DIAGNOSIS — Z79899 Other long term (current) drug therapy: Secondary | ICD-10-CM | POA: Diagnosis not present

## 2016-09-18 DIAGNOSIS — C50919 Malignant neoplasm of unspecified site of unspecified female breast: Secondary | ICD-10-CM | POA: Diagnosis present

## 2016-09-18 DIAGNOSIS — I739 Peripheral vascular disease, unspecified: Secondary | ICD-10-CM | POA: Insufficient documentation

## 2016-09-18 DIAGNOSIS — K5900 Constipation, unspecified: Secondary | ICD-10-CM | POA: Diagnosis not present

## 2016-09-18 DIAGNOSIS — C50411 Malignant neoplasm of upper-outer quadrant of right female breast: Secondary | ICD-10-CM | POA: Diagnosis present

## 2016-09-18 DIAGNOSIS — R112 Nausea with vomiting, unspecified: Secondary | ICD-10-CM | POA: Diagnosis present

## 2016-09-18 DIAGNOSIS — C782 Secondary malignant neoplasm of pleura: Secondary | ICD-10-CM | POA: Diagnosis not present

## 2016-09-18 DIAGNOSIS — C7951 Secondary malignant neoplasm of bone: Secondary | ICD-10-CM | POA: Diagnosis not present

## 2016-09-18 DIAGNOSIS — Z79891 Long term (current) use of opiate analgesic: Secondary | ICD-10-CM | POA: Insufficient documentation

## 2016-09-18 DIAGNOSIS — R6889 Other general symptoms and signs: Secondary | ICD-10-CM

## 2016-09-18 DIAGNOSIS — R1011 Right upper quadrant pain: Secondary | ICD-10-CM | POA: Diagnosis present

## 2016-09-18 DIAGNOSIS — C50011 Malignant neoplasm of nipple and areola, right female breast: Secondary | ICD-10-CM

## 2016-09-18 DIAGNOSIS — C772 Secondary and unspecified malignant neoplasm of intra-abdominal lymph nodes: Secondary | ICD-10-CM | POA: Diagnosis not present

## 2016-09-18 DIAGNOSIS — M546 Pain in thoracic spine: Secondary | ICD-10-CM | POA: Diagnosis present

## 2016-09-18 DIAGNOSIS — J910 Malignant pleural effusion: Secondary | ICD-10-CM | POA: Diagnosis present

## 2016-09-18 DIAGNOSIS — C801 Malignant (primary) neoplasm, unspecified: Secondary | ICD-10-CM

## 2016-09-18 DIAGNOSIS — K831 Obstruction of bile duct: Secondary | ICD-10-CM

## 2016-09-18 DIAGNOSIS — Z66 Do not resuscitate: Secondary | ICD-10-CM | POA: Diagnosis not present

## 2016-09-18 DIAGNOSIS — R7989 Other specified abnormal findings of blood chemistry: Secondary | ICD-10-CM | POA: Diagnosis present

## 2016-09-18 DIAGNOSIS — I890 Lymphedema, not elsewhere classified: Secondary | ICD-10-CM | POA: Diagnosis not present

## 2016-09-18 DIAGNOSIS — I517 Cardiomegaly: Secondary | ICD-10-CM | POA: Diagnosis not present

## 2016-09-18 DIAGNOSIS — Z91048 Other nonmedicinal substance allergy status: Secondary | ICD-10-CM | POA: Diagnosis not present

## 2016-09-18 DIAGNOSIS — Z885 Allergy status to narcotic agent status: Secondary | ICD-10-CM | POA: Insufficient documentation

## 2016-09-18 DIAGNOSIS — E8989 Other postprocedural endocrine and metabolic complications and disorders: Secondary | ICD-10-CM

## 2016-09-18 DIAGNOSIS — Z170 Estrogen receptor positive status [ER+]: Secondary | ICD-10-CM

## 2016-09-18 DIAGNOSIS — R17 Unspecified jaundice: Secondary | ICD-10-CM | POA: Diagnosis present

## 2016-09-18 HISTORY — PX: IR EXCHANGE BILIARY DRAIN: IMG6046

## 2016-09-18 LAB — CBC WITH DIFFERENTIAL/PLATELET
BASOS ABS: 0 10*3/uL (ref 0.0–0.1)
Basophils Relative: 0 %
EOS ABS: 0.1 10*3/uL (ref 0.0–0.7)
EOS PCT: 2 %
HCT: 23.6 % — ABNORMAL LOW (ref 36.0–46.0)
HEMOGLOBIN: 8.7 g/dL — AB (ref 12.0–15.0)
LYMPHS PCT: 7 %
Lymphs Abs: 0.6 10*3/uL — ABNORMAL LOW (ref 0.7–4.0)
MCH: 34.3 pg — ABNORMAL HIGH (ref 26.0–34.0)
MCHC: 36.9 g/dL — ABNORMAL HIGH (ref 30.0–36.0)
MCV: 92.9 fL (ref 78.0–100.0)
Monocytes Absolute: 0.6 10*3/uL (ref 0.1–1.0)
Monocytes Relative: 8 %
NEUTROS PCT: 83 %
Neutro Abs: 6.4 10*3/uL (ref 1.7–7.7)
PLATELETS: 411 10*3/uL — AB (ref 150–400)
RBC: 2.54 MIL/uL — AB (ref 3.87–5.11)
RDW: 17.1 % — ABNORMAL HIGH (ref 11.5–15.5)
WBC: 7.8 10*3/uL (ref 4.0–10.5)

## 2016-09-18 LAB — COMPREHENSIVE METABOLIC PANEL
ALT: 206 U/L — AB (ref 14–54)
AST: 268 U/L — AB (ref 15–41)
Albumin: 2.2 g/dL — ABNORMAL LOW (ref 3.5–5.0)
Alkaline Phosphatase: 581 U/L — ABNORMAL HIGH (ref 38–126)
Anion gap: 9 (ref 5–15)
BUN: 12 mg/dL (ref 6–20)
CHLORIDE: 87 mmol/L — AB (ref 101–111)
CO2: 26 mmol/L (ref 22–32)
CREATININE: 0.62 mg/dL (ref 0.44–1.00)
Calcium: 8.3 mg/dL — ABNORMAL LOW (ref 8.9–10.3)
GFR calc Af Amer: >60 mL/min (ref >60)
GFR calc non Af Amer: >60 mL/min (ref >60)
Glucose, Bld: 91 mg/dL (ref 65–99)
Potassium: 4.3 mmol/L (ref 3.5–5.1)
SODIUM: 122 mmol/L — AB (ref 135–145)
Total Bilirubin: 20.9 mg/dL (ref 0.3–1.2)
Total Protein: 5.1 g/dL — ABNORMAL LOW (ref 6.5–8.1)

## 2016-09-18 LAB — PROTIME-INR
INR: 1.34
Prothrombin Time: 16.7 seconds — ABNORMAL HIGH (ref 11.4–15.2)

## 2016-09-18 MED ORDER — KETOROLAC TROMETHAMINE 10 MG PO TABS
15.0000 mg | ORAL_TABLET | Freq: Once | ORAL | Status: AC
  Administered 2016-09-18: 15 mg via ORAL
  Filled 2016-09-18: qty 2

## 2016-09-18 MED ORDER — PIPERACILLIN-TAZOBACTAM 3.375 G IVPB
INTRAVENOUS | Status: AC
  Administered 2016-09-18: 3.375 g via INTRAVENOUS
  Filled 2016-09-18: qty 50

## 2016-09-18 MED ORDER — SODIUM CHLORIDE 0.9% FLUSH
10.0000 mL | INTRAVENOUS | Status: DC | PRN
  Administered 2016-09-19: 10 mL
  Filled 2016-09-18: qty 40

## 2016-09-18 MED ORDER — KETOROLAC TROMETHAMINE 10 MG PO TABS
15.0000 mg | ORAL_TABLET | Freq: Four times a day (QID) | ORAL | Status: DC
Start: 2016-09-19 — End: 2016-09-19
  Administered 2016-09-18 – 2016-09-19 (×2): 15 mg via ORAL
  Filled 2016-09-18 (×2): qty 2

## 2016-09-18 MED ORDER — KETOROLAC TROMETHAMINE 10 MG PO TABS: 10.0000 mg | ORAL_TABLET | Freq: Once | ORAL | Status: DC

## 2016-09-18 MED ORDER — PIPERACILLIN-TAZOBACTAM 3.375 G IVPB
3.3750 g | Freq: Once | INTRAVENOUS | Status: DC
  Administered 2016-09-18: 3.375 g via INTRAVENOUS

## 2016-09-18 MED ORDER — SODIUM CHLORIDE 0.9% FLUSH
10.0000 mL | INTRAVENOUS | Status: DC | PRN
  Administered 2016-09-18: 10 mL via INTRAVENOUS
  Administered 2016-09-21: 20 mL via INTRAVENOUS
  Filled 2016-09-18: qty 10

## 2016-09-18 MED ORDER — LIDOCAINE HCL 1 % IJ SOLN
INTRAMUSCULAR | Status: AC
  Filled 2016-09-18: qty 20

## 2016-09-18 MED ORDER — MEPERIDINE HCL 100 MG/ML IJ SOLN: 25.0000 mg | Freq: Once | INTRAMUSCULAR | Status: DC

## 2016-09-18 MED ORDER — SODIUM CHLORIDE 0.9% FLUSH
10.0000 mL | Freq: Two times a day (BID) | INTRAVENOUS | Status: DC
  Administered 2016-09-19: 10 mL

## 2016-09-18 MED ORDER — FAMOTIDINE 20 MG PO TABS
40.0000 mg | ORAL_TABLET | Freq: Once | ORAL | Status: AC
  Administered 2016-09-18: 40 mg via ORAL

## 2016-09-18 MED ORDER — KETOROLAC TROMETHAMINE 10 MG PO TABS
10.0000 mg | ORAL_TABLET | Freq: Four times a day (QID) | ORAL | Status: DC | PRN
  Filled 2016-09-18: qty 1

## 2016-09-18 MED ORDER — IOPAMIDOL (ISOVUE-300) INJECTION 61%
INTRAVENOUS | Status: AC
  Administered 2016-09-18: 20 mL
  Filled 2016-09-18: qty 50

## 2016-09-18 MED ORDER — PIPERACILLIN-TAZOBACTAM 3.375 G IVPB
3.3750 g | Freq: Once | INTRAVENOUS | Status: DC
  Filled 2016-09-18: qty 50

## 2016-09-18 MED ORDER — FENTANYL CITRATE (PF) 100 MCG/2ML IJ SOLN
INTRAMUSCULAR | Status: AC | PRN
  Administered 2016-09-18 (×2): 50 ug via INTRAVENOUS

## 2016-09-18 MED ORDER — HEPARIN SOD (PORK) LOCK FLUSH 100 UNIT/ML IV SOLN
250.0000 [IU] | INTRAVENOUS | Status: DC | PRN
  Administered 2016-09-21: 250 [IU]
  Filled 2016-09-18: qty 2.5

## 2016-09-18 MED ORDER — TRAMADOL HCL 50 MG PO TABS
50.0000 mg | ORAL_TABLET | Freq: Four times a day (QID) | ORAL | Status: DC
  Administered 2016-09-18 (×2): 50 mg via ORAL
  Administered 2016-09-19: 100 mg via ORAL
  Administered 2016-09-19: 50 mg via ORAL
  Filled 2016-09-18 (×2): qty 1
  Filled 2016-09-18 (×3): qty 2

## 2016-09-18 MED ORDER — MIDAZOLAM HCL 2 MG/2ML IJ SOLN
INTRAMUSCULAR | Status: AC | PRN
  Administered 2016-09-18 (×4): 1 mg via INTRAVENOUS

## 2016-09-18 MED ORDER — MEPERIDINE HCL 50 MG/ML IJ SOLN
INTRAMUSCULAR | Status: AC
  Administered 2016-09-18: 25 mg
  Filled 2016-09-18: qty 1

## 2016-09-18 MED ORDER — KETOROLAC TROMETHAMINE 15 MG/ML IJ SOLN
15.0000 mg | Freq: Four times a day (QID) | INTRAMUSCULAR | Status: DC | PRN
  Filled 2016-09-18: qty 1

## 2016-09-18 MED ORDER — IOPAMIDOL (ISOVUE-300) INJECTION 61%
25.0000 mL | Freq: Once | INTRAVENOUS | Status: AC | PRN
  Administered 2016-09-18: 20 mL

## 2016-09-18 MED ORDER — SODIUM CHLORIDE 0.9 % IV SOLN
INTRAVENOUS | Status: DC
  Administered 2016-09-18: 14:00:00 via INTRAVENOUS

## 2016-09-18 MED ORDER — MIDAZOLAM HCL 2 MG/2ML IJ SOLN
INTRAMUSCULAR | Status: AC
  Filled 2016-09-18: qty 6

## 2016-09-18 MED ORDER — TRAMADOL HCL 50 MG PO TABS: 50.0000 mg | ORAL_TABLET | Freq: Four times a day (QID) | ORAL | Status: DC

## 2016-09-18 MED ORDER — HEPARIN SOD (PORK) LOCK FLUSH 100 UNIT/ML IV SOLN
250.0000 [IU] | Freq: Every day | INTRAVENOUS | Status: DC
  Administered 2016-09-21: 250 [IU]
  Filled 2016-09-18 (×3): qty 2.5

## 2016-09-18 MED ORDER — PIPERACILLIN-TAZOBACTAM 3.375 G IVPB
3.3750 g | Freq: Three times a day (TID) | INTRAVENOUS | Status: DC
  Administered 2016-09-19 – 2016-09-20 (×4): 3.375 g via INTRAVENOUS
  Filled 2016-09-18 (×2): qty 50

## 2016-09-18 MED ORDER — LIDOCAINE HCL 1 % IJ SOLN
INTRAMUSCULAR | Status: AC | PRN
  Administered 2016-09-18: 5 mL

## 2016-09-18 MED ORDER — FENTANYL CITRATE (PF) 100 MCG/2ML IJ SOLN
INTRAMUSCULAR | Status: AC
  Filled 2016-09-18: qty 4

## 2016-09-18 NOTE — Progress Notes (Signed)
CRITICAL VALUE ALERT  Critical Value:  20.9 Total Billirubin  Date & Time Notied:  Paged PA Rowe Robert at 1415  Provider Notified: 1418, paged was returned by Rowe Robert, PA  Orders Received/Actions taken: no new orders at this time.

## 2016-09-18 NOTE — H&P (Signed)
[place]  [phone] [phone]     ID: KENECIA BARREN DOB: [birth year]  MR#: [id number]  YJE#:[id number]  Patient Care Team: Jana Hakim Virgie Dad, MD as PCP - General (Oncology) Naasir Carreira, Virgie Dad, MD as Consulting Physician (Oncology) [name], MD (Obstetrics and Gynecology) Deatra Ina, MD as Referring Physician (Internal Medicine) Esaw Dace, MD as Referring Physician (Internal Medicine) Gery Pray, MD as Consulting Physician (Radiation Oncology) Laureen Abrahams, RN as Registered Nurse Sandi Mariscal, MD as Consulting Physician (Interventional Radiology) Chauncey Cruel, MD OTHER MD:  CHIEF COMPLAINT: rigors following procedure, intrahepatic cholestasis, stage IV breast cancer  CURRENT TREATMENT: antibiotics, analgesics  INTERVAL HISTORY: Lovelle has known widely metastatic stage IV breast cancer with liver involvement. This is cause severe intrahepatic cholestasis. She had a drain placed last week, which initially worked well but later her total bilirubin started to rise again. Accordingly she was brought back to interventional radiology today where she had a 12-gauge drain placed, in order to optimize drainage of bile from the liver. It is not uncommon to have transient bacteremia after this procedure and the patient had right gutters and a temperature in the holding area. She is being admitted as a 24-hour observation and to receive antibiotic coverage until stable.  REVIEW OF SYSTEMS: Jazzlene has had persistent pain at home, controlled on Toradol and tramadol which she receives alternatingly every 3 hours. She has significant constipation problems and she uses ever ID of over-the-counter agents as well as MiraLAX and stool softeners regularly to deal with this. If the pain is not controlled with the above agents she does have liquid morphine available but she has not been using this. The patient has been increasingly  fatigued and has been sleeping more during the day than before. There has been no unusual headache, cough, phlegm production, or pleurisy. A detailed review of systems otherwise has been stable  BREAST CANCER HISTORY: Please see summary under "assessment" below   PAST MEDICAL HISTORY: Past Medical History:  Diagnosis Date  . Bone metastases ([place]) dx'd 05/2014  . Breast cancer ([place]) dx'd 2005/2011  . Peripheral vascular disease ([place]) 02/2010   blood clot related to porta cath  . PONV (postoperative nausea and vomiting)   . S/P radiation therapy 07/17/2014 through 08/02/2014    Left mediastinum, left seventh rib 3250 cGy in 13 sessions   . S/P radiation therapy 12/11/2014 through 12/22/2014    Left parietal calvarium 2400 cGy in 8 sessions   . Seizures ([place]) 2010   Isolated incident.    PAST SURGICAL HISTORY: Past Surgical History:  Procedure Laterality Date  . AXILLARY LYMPH NODE DISSECTION  Dec. 2011  . BREAST LUMPECTOMY  2005  . IR EXCHANGE BILIARY DRAIN  09/18/2016  . IR FLUORO GUIDE CV LINE LEFT  09/05/2016  . IR GENERIC HISTORICAL  05/21/2016   IR RADIOLOGIST EVAL & MGMT 05/21/2016 Sandi Mariscal, MD [facility] INTERV RAD  . IR INT EXT BILIARY DRAIN WITH CHOLANGIOGRAM  09/06/2016  . IR US GUIDE VASC ACCESS LEFT  09/05/2016  . MEDIASTINOTOMY CHAMBERLAIN MCNEIL Left 06/02/2013   Procedure: MEDIASTINOTOMY CHAMBERLAIN MCNEIL;  Surgeon: [name], MD;  Location: [facility];  Service: Thoracic;  Laterality: Left;  LEFT ANTERIOR MEDIASTINOTOMY   . PORTACATH PLACEMENT  12/11  . removal portacath      FAMILY HISTORY Family History  Problem Relation Age of Onset  . COPD Mother   . Breast cancer Sister 22    GYNECOLOGIC HISTORY:  No LMP recorded. Patient is not currently having periods (Reason: Chemotherapy).   Social history: The  patient is a homemaker, she and her husband [name] have no children together although he has a child from a prior marriage.  ADVANCED DIRECTIVES: The patient's husband is her healthcare power of attorney   HEALTH MAINTENANCE: Social History  Substance Use Topics  . Smoking status: Never Smoker  . Smokeless tobacco: Never Used  . Alcohol use No      Allergies  Allergen Reactions  . 2nd Skin Quick Heal Other (See Comments)    Other Reaction: Skin peels  . Decadron [Dexamethasone] Other (See Comments)    Patient does not tolerate steroids.   . Dilaudid [Hydromorphone] Nausea And Vomiting  . Enoxaparin Other (See Comments)    unknown  . Fluconazole Swelling    Liver toxicity  . Hydromorphone Hcl Nausea And Vomiting  . Morphine And Related Nausea And Vomiting  . Ondansetron Other (See Comments)    "makes me loopy"  . Protonix [Pantoprazole Sodium] Other (See Comments)    Patient reports it caused thrush.  . Tegaderm Ag Mesh [Silver]     No current facility-administered medications for this encounter.    No current outpatient prescriptions on file.   Facility-Administered Medications Ordered in Other Encounters  Medication Dose Route Frequency Provider Last Rate Last Dose  . 0.9 %  sodium chloride infusion   Intravenous Continuous Allred, Darrell K, PA-C 100 mL/hr at 09/18/16 1702    . fentaNYL (SUBLIMAZE) 100 MCG/2ML injection           . heparin lock flush 100 unit/mL  250 Units Intracatheter Daily Arne Cleveland, MD       And  . heparin lock flush 100 unit/mL  250 Units Intracatheter PRN Arne Cleveland, MD      . meperidine (DEMEROL) injection 25 mg  25 mg Intravenous Once Allred, Darrell K, PA-C      . midazolam (VERSED) 2 MG/2ML injection           . piperacillin-tazobactam (ZOSYN) IVPB 3.375 g  3.375 g Intravenous Once Allred, Darrell K, PA-C      . sodium chloride flush (NS) 0.9 % injection 10 mL  10 mL Intravenous PRN Allred, Darrell K, PA-C   10 mL at 09/18/16 1330      OBJECTIVE: Middle-aged white woman examined in bed  There were no vitals filed for this visit.   There is no height or weight on file to calculate BMI.  @WEIGHTLAST3 @  Ocular: Sclerae icteric, pupils round and equal Lymphatic: No cervical or supraclavicular adenopathy Lungs no rales or rhonchi, auscultated anterolaterally Heart regular rate and rhythm Abd firm, moderately tender Neuro: non-focal Breasts: Deferred   LAB RESULTS:  CMP     Component Value Date/Time   NA 122 (L) 09/18/2016 1316   NA 123 (L) 09/15/2016 1131   K 4.3 09/18/2016 1316   K 4.7 09/15/2016 1131   CL 87 (L) 09/18/2016 1316   CL 105 05/06/2012 1333   CO2 26 09/18/2016 1316   CO2 25 09/15/2016 1131   GLUCOSE 91 09/18/2016 1316   GLUCOSE 87 09/15/2016 1131   GLUCOSE 124 (H) 05/06/2012 1333   BUN 12 09/18/2016 1316   BUN 11.9 09/15/2016 1131   CREATININE 0.62 09/18/2016 1316   CREATININE 0.7 09/15/2016 1131   CALCIUM 8.3 (L) 09/18/2016 1316   CALCIUM 9.3 09/15/2016 1131   PROT 5.1 (L) 09/18/2016 1316   PROT 4.8 (L) 09/15/2016 1131  ALBUMIN 2.2 (L) 09/18/2016 1316   ALBUMIN 1.9 (L) 09/15/2016 1131   AST 268 (H) 09/18/2016 1316   AST 273 (HH) 09/15/2016 1131   ALT 206 (H) 09/18/2016 1316   ALT 249 (H) 09/15/2016 1131   ALKPHOS 581 (H) 09/18/2016 1316   ALKPHOS 560 (H) 09/15/2016 1131   BILITOT 20.9 (HH) 09/18/2016 1316   BILITOT 18.65 (HH) 09/15/2016 1131   GFRNONAA >60 09/18/2016 1316   GFRAA >60 09/18/2016 1316    No results found for: TOTALPROTELP, ALBUMINELP, A1GS, A2GS, BETS, BETA2SER, GAMS, MSPIKE, SPEI  No results found for: Nils Pyle, [facility]  Lab Results  Component Value Date   WBC 7.8 09/18/2016   NEUTROABS 6.4 09/18/2016   HGB 8.7 (L) 09/18/2016   HCT 23.6 (L) 09/18/2016   MCV 92.9 09/18/2016   PLT 411 (H) 09/18/2016    @LASTCHEMISTRY @  Lab Results  Component Value Date   LABCA2 25 11/02/2007    No components found for: [id number]   Recent  Labs Lab 09/18/16 1316  INR 1.34    Urinalysis    Component Value Date/Time   COLORURINE AMBER (A) 08/26/2016 0334   APPEARANCEUR CLEAR 08/26/2016 0334   LABSPEC 1.015 08/26/2016 0334   LABSPEC 1.005 05/16/2016 1201   PHURINE 5.0 08/26/2016 0334   GLUCOSEU NEGATIVE 08/26/2016 0334   GLUCOSEU Negative 05/16/2016 1201   HGBUR SMALL (A) 08/26/2016 0334   BILIRUBINUR MODERATE (A) 08/26/2016 0334   BILIRUBINUR Negative 05/16/2016 1201   KETONESUR 20 (A) 08/26/2016 0334   PROTEINUR NEGATIVE 08/26/2016 0334   UROBILINOGEN 0.2 05/16/2016 1201   NITRITE NEGATIVE 08/26/2016 0334   LEUKOCYTESUR NEGATIVE 08/26/2016 0334   LEUKOCYTESUR Small 05/16/2016 1201     STUDIES: Nm Hepatobiliary Liver Func  Result Date: 08/26/2016 CLINICAL DATA:  Right upper quadrant pain EXAM: NUCLEAR MEDICINE HEPATOBILIARY IMAGING VIEWS: Anterior right upper quadrant RADIOPHARMACEUTICALS:  7.9 mCi Tc-30m Choletec IV COMPARISON:  Ultrasound right upper quadrant Aug 15, 2016 FINDINGS: Images were obtained serially over a 3 hour time span. Morphine was not administered due to history of allergic reaction to morphine. Liver uptake of radiotracer is inhomogeneous consistent with known liver metastases. Over a 3 hour time span, there is no appreciable visualization of either gallbladder or small bowel. IMPRESSION: Nonvisualization of gallbladder over a 3 hour time span is suggestive of cystic duct obstruction/ acute cholecystitis. Note, however, that there is also nonvisualization of small bowel after 3 hours. This finding potentially could be indicative of common bile duct obstruction. However, this finding also could be indicative of hepatic stasis phenomenon. Serum bilirubin is moderately elevated at this time. Known liver metastases demonstrated by inhomogeneous uptake of radiotracer. Given lack of visualization of both gallbladder and small bowel, it may be prudent to consider MRCP to assess for possible common bile duct  lesion as the cause for lack of small bowel visualization on this study. Electronically Signed   By: WLowella GripIII M.D.   On: 08/26/2016 16:13   UKoreaAbdomen Complete  Result Date: 08/26/2016 CLINICAL DATA:  RIGHT upper quadrant pain beginning yesterday. Jaundice. History of metastatic breast cancer with known liver metastasis. EXAM: ABDOMEN ULTRASOUND COMPLETE COMPARISON:  Abdominal ultrasound Aug 15, 2016 and MRI of the abdomen July 24, 2016 FINDINGS: Gallbladder: 1.9 cm echogenic gallstone with acoustic shadowing surrounded by mildly echogenic sludge with increased through transmission. Mild gallbladder wall thickening without pericholecystic fluid. Sonographic Murphy's sign elicited. Common bile duct: Diameter: 5 mm Liver: Multiple heterogeneous liver mass is consistent with  patient's known metastasis. Porta hepatis lymphadenopathy present though, better characterized on prior MRI. IVC: No abnormality visualized. Pancreas: Visualized portion unremarkable. Spleen: Size and appearance within normal limits. Right Kidney: Length: 11.5 cm. Echogenicity within normal limits. No mass or hydronephrosis visualized. Left Kidney: Length: 11.8 cm. Echogenicity within normal limits. No mass or hydronephrosis visualized. Abdominal aorta: No aneurysm visualized. Other findings: None. IMPRESSION: Cholelithiasis and sonographic findings of acute cholecystitis. Numerous hepatic and nodal metastasis. Electronically Signed   By: Elon Alas M.D.   On: 08/26/2016 06:36   Mr 3d Recon At Scanner  Result Date: 08/29/2016 CLINICAL DATA:  Metastatic breast cancer with liver metastasis. Evaluate for metastasis versus biliary obstruction. New chemotherapy. Jaundice. EXAM: MRI ABDOMEN WITHOUT AND WITH CONTRAST (INCLUDING MRCP) TECHNIQUE: Multiplanar multisequence MR imaging of the abdomen was performed both before and after the administration of intravenous contrast. Heavily T2-weighted images of the biliary and  pancreatic ducts were obtained, and three-dimensional MRCP images were rendered by post processing. CONTRAST:  70m MULTIHANCE GADOBENATE DIMEGLUMINE 529 MG/ML IV SOLN COMPARISON:  Ultrasound 08/26/2016.  Most recent MRI of 07/24/2016. FINDINGS: Portions of exam are minimally motion degraded. Lower chest: Mild cardiomegaly. Left-sided pleural thickening remains. Index enhancing left pleural implant measures 1.4 cm on image 19/ series 11001. Compare 1.5 cm on the prior exam. More anterior implant measures 1.5 cm on image 20/ series 1101 compare 1.4 cm on the prior. Hepatobiliary: Extensive hepatic metastasis. Overall mild to (given short interval ) moderate progression. Example central right hepatic lobe lesion at 3.3 x 3.8 cm on image 47/ series 11001. Compare 2.8 x 3.5 cm on the prior. Lateral segment left liver lobe lesion measures 3.1 cm on image 49/ series 11001. Compare 3.0 cm on the prior. A pericholecystic right liver lobe lesion measures 4.7 x 4.0 cm on image 60/ series 11001. Compare 3.8 x 2.9 cm on the prior. Interval development of moderate intrahepatic biliary duct dilatation bilaterally. This continues to the level of the porta hepatis. Example image 89/series 4 and image 80/series 400. Amorphous soft tissue signal in this region is likely the cause, secondary to extension of porta hepatis infiltrative adenopathy. Example image 31/series 3 and image 49/ series 11002. A gallstone is again identified. There is moderate gallbladder wall thickening on image 36/series 3. The distal common duct is normal in caliber. Pancreas:  Normal, without mass or ductal dilatation. Spleen:  Normal in size, without focal abnormality. Adrenals/Urinary Tract: Normal adrenal glands. Normal kidneys, without hydronephrosis. Stomach/Bowel: Normal stomach and abdominal bowel loops. Vascular/Lymphatic: Aortic atherosclerosis. Circumaortic left renal vein. Porta hepatis adenopathy is progressive as detailed above. Example component  in the portal caval space at 1.6 cm on image 31/series 3. Compare 1.4 cm on the prior. Progressive retroperitoneal adenopathy. A new node in the aortocaval space measures 1.3 cm on image 39/series 3. Other:  Trace perihepatic ascites. Musculoskeletal: Re- demonstration of osseous metastasis. Example lesion with the T11 vertebral body which measures 1.5 cm on image 22/series 12. Compare 1.4 cm on the prior. The inferior L3 lesion measures 2.8 x 1.6 cm on image 28/series 12 versus 2.9 x 1.8 cm on the prior. IMPRESSION: 1. Progression of hepatic metastasis. 2. Progressive adenopathy within the porta hepatis with increased retroperitoneal nodal metastasis. 3. Development of moderate intrahepatic biliary duct dilatation, secondary to soft tissue fullness in the porta hepatis, favored to related to infiltrative progressive adenopathy. Normal caliber common duct distally. 4. Cholelithiasis. Gallbladder wall thickening is nonspecific. Please see prior ultrasound. 5. Similar osseous and  left pleural metastasis. Findings were reported to Dr. Loletha Carrow at 1:35 p.m. Electronically Signed   By: Abigail Miyamoto M.D.   On: 08/29/2016 13:36   Ir Fluoro Guide Cv Line Left  Result Date: 09/05/2016 INDICATION: Metastatic breast cancer to liver, poor venous access; request made for central venous access . EXAM: LEFT UPPER EXTREMITY PICC LINE PLACEMENT WITH ULTRASOUND AND FLUOROSCOPIC GUIDANCE MEDICATIONS: None ANESTHESIA/SEDATION: None FLUOROSCOPY TIME:  Fluoroscopy Time:  42 seconds COMPLICATIONS: None immediate. PROCEDURE: The patient was advised of the possible risks and complications and agreed to undergo the procedure. The patient was then brought to the angiographic suite for the procedure. The left arm was prepped with chlorhexidine, draped in the usual sterile fashion using maximum barrier technique (cap and mask, sterile gown, sterile gloves, large sterile sheet, hand hygiene and cutaneous antisepsis) and infiltrated locally with  1% Lidocaine. Ultrasound demonstrated patency of the left brachial vein, and this was documented with an image. Under real-time ultrasound guidance, this vein was accessed with a 21 gauge micropuncture needle and image documentation was performed. A 0.018 wire was introduced in to the vein. Over this, a 5 [place] double lumen power injectable PICC was advanced to the lower SVC/right atrial junction. Fluoroscopy during the procedure and fluoro spot radiograph confirms appropriate catheter position. The catheter was flushed and covered with a sterile dressing. Catheter length:  41 cm IMPRESSION: Successful left arm power PICC line placement with ultrasound and fluoroscopic guidance. The catheter is ready for use. Read by: Rowe Robert, PA-C Electronically Signed   By: Markus Daft M.D.   On: 09/05/2016 16:00   Ir US Guide Vasc Access Left  Result Date: 09/05/2016 INDICATION: Metastatic breast cancer to liver, poor venous access; request made for central venous access . EXAM: LEFT UPPER EXTREMITY PICC LINE PLACEMENT WITH ULTRASOUND AND FLUOROSCOPIC GUIDANCE MEDICATIONS: None ANESTHESIA/SEDATION: None FLUOROSCOPY TIME:  Fluoroscopy Time:  42 seconds COMPLICATIONS: None immediate. PROCEDURE: The patient was advised of the possible risks and complications and agreed to undergo the procedure. The patient was then brought to the angiographic suite for the procedure. The left arm was prepped with chlorhexidine, draped in the usual sterile fashion using maximum barrier technique (cap and mask, sterile gown, sterile gloves, large sterile sheet, hand hygiene and cutaneous antisepsis) and infiltrated locally with 1% Lidocaine. Ultrasound demonstrated patency of the left brachial vein, and this was documented with an image. Under real-time ultrasound guidance, this vein was accessed with a 21 gauge micropuncture needle and image documentation was performed. A 0.018 wire was introduced in to the vein. Over this, a 5 [place] double  lumen power injectable PICC was advanced to the lower SVC/right atrial junction. Fluoroscopy during the procedure and fluoro spot radiograph confirms appropriate catheter position. The catheter was flushed and covered with a sterile dressing. Catheter length:  41 cm IMPRESSION: Successful left arm power PICC line placement with ultrasound and fluoroscopic guidance. The catheter is ready for use. Read by: Rowe Robert, PA-C Electronically Signed   By: Markus Daft M.D.   On: 09/05/2016 16:00   Mr Abdomen Mrcp W Wo Contast  Result Date: 08/29/2016 CLINICAL DATA:  Metastatic breast cancer with liver metastasis. Evaluate for metastasis versus biliary obstruction. New chemotherapy. Jaundice. EXAM: MRI ABDOMEN WITHOUT AND WITH CONTRAST (INCLUDING MRCP) TECHNIQUE: Multiplanar multisequence MR imaging of the abdomen was performed both before and after the administration of intravenous contrast. Heavily T2-weighted images of the biliary and pancreatic ducts were obtained, and three-dimensional MRCP images were rendered by  post processing. CONTRAST:  36m MULTIHANCE GADOBENATE DIMEGLUMINE 529 MG/ML IV SOLN COMPARISON:  Ultrasound 08/26/2016.  Most recent MRI of 07/24/2016. FINDINGS: Portions of exam are minimally motion degraded. Lower chest: Mild cardiomegaly. Left-sided pleural thickening remains. Index enhancing left pleural implant measures 1.4 cm on image 19/ series 11001. Compare 1.5 cm on the prior exam. More anterior implant measures 1.5 cm on image 20/ series 1101 compare 1.4 cm on the prior. Hepatobiliary: Extensive hepatic metastasis. Overall mild to (given short interval ) moderate progression. Example central right hepatic lobe lesion at 3.3 x 3.8 cm on image 47/ series 11001. Compare 2.8 x 3.5 cm on the prior. Lateral segment left liver lobe lesion measures 3.1 cm on image 49/ series 11001. Compare 3.0 cm on the prior. A pericholecystic right liver lobe lesion measures 4.7 x 4.0 cm on image 60/ series 11001.  Compare 3.8 x 2.9 cm on the prior. Interval development of moderate intrahepatic biliary duct dilatation bilaterally. This continues to the level of the porta hepatis. Example image 89/series 4 and image 80/series 400. Amorphous soft tissue signal in this region is likely the cause, secondary to extension of porta hepatis infiltrative adenopathy. Example image 31/series 3 and image 49/ series 11002. A gallstone is again identified. There is moderate gallbladder wall thickening on image 36/series 3. The distal common duct is normal in caliber. Pancreas:  Normal, without mass or ductal dilatation. Spleen:  Normal in size, without focal abnormality. Adrenals/Urinary Tract: Normal adrenal glands. Normal kidneys, without hydronephrosis. Stomach/Bowel: Normal stomach and abdominal bowel loops. Vascular/Lymphatic: Aortic atherosclerosis. Circumaortic left renal vein. Porta hepatis adenopathy is progressive as detailed above. Example component in the portal caval space at 1.6 cm on image 31/series 3. Compare 1.4 cm on the prior. Progressive retroperitoneal adenopathy. A new node in the aortocaval space measures 1.3 cm on image 39/series 3. Other:  Trace perihepatic ascites. Musculoskeletal: Re- demonstration of osseous metastasis. Example lesion with the T11 vertebral body which measures 1.5 cm on image 22/series 12. Compare 1.4 cm on the prior. The inferior L3 lesion measures 2.8 x 1.6 cm on image 28/series 12 versus 2.9 x 1.8 cm on the prior. IMPRESSION: 1. Progression of hepatic metastasis. 2. Progressive adenopathy within the porta hepatis with increased retroperitoneal nodal metastasis. 3. Development of moderate intrahepatic biliary duct dilatation, secondary to soft tissue fullness in the porta hepatis, favored to related to infiltrative progressive adenopathy. Normal caliber common duct distally. 4. Cholelithiasis. Gallbladder wall thickening is nonspecific. Please see prior ultrasound. 5. Similar osseous and left  pleural metastasis. Findings were reported to Dr. DLoletha Carrowat 1:35 p.m. Electronically Signed   By: KAbigail MiyamotoM.D.   On: 08/29/2016 13:36   Ir Int ELianne CureBiliary Drain With Cholangiogram  Result Date: 09/06/2016 INDICATION: History of metastatic breast cancer, now with malignant obstructive jaundice. Request made for placement of percutaneous biliary drainage catheter in hopes of optimizing liver function tests to improve patient's candidacy for potential Y 90 or additional systemic therapies. EXAM: ULTRASOUND AND FLUOROSCOPIC GUIDED PERCUTANEOUS TRANSHEPATIC CHOLANGIOGRAM AND BILIARY TUBE PLACEMENT COMPARISON:  CT abdomen pelvis - 09/05/2016; abdominal MRI - 08/29/2016 MEDICATIONS: Zosyn 3.375 g IV ; the antibiotic was administered with an appropriate time frame prior to the initiation of the procedure. CONTRAST:  263mISOVUE-300 IOPAMIDOL (ISOVUE-300) INJECTION 61% - administered into the biliary tree ANESTHESIA/SEDATION: Moderate (conscious) sedation was employed during this procedure. A total of Versed 5 mg and Fentanyl 250 mcg was administered intravenously. Moderate Sedation Time: 57 minutes. The patient's  level of consciousness and vital signs were monitored continuously by radiology nursing throughout the procedure under my direct supervision. FLUOROSCOPY TIME:  10 minutes 54 seconds (517 mGy) COMPLICATIONS: None immediate. TECHNIQUE: Informed written consent was obtained from the patient after a discussion of the risks, benefits and alternatives to treatment. Questions regarding the procedure were encouraged and answered. A timeout was performed prior to the initiation of the procedure. The midline of the upper abdominal quadrant was prepped and draped in the usual sterile fashion, and a sterile drape was applied covering the operative field. Maximum barrier sterile technique with sterile gowns and gloves were used for the procedure. A timeout was performed prior to the initiation of the procedure. Under  direct ultrasound guidance, a dilated duct within the peripheral aspect of the left lobe of the liver was accessed with a [place] needle. Contrast injection confirmed appropriate positioning. A Nitrex wire was advanced to the level of the malignant obstruction at the level the biliary hilum. Despite prolonged efforts, the inner 3 French catheter from the [place] set could not be advanced into the biliary tree given the cranial location of the left lobe of the liver as well as the angulation of the accessed duct. As such, the more peripheral aspect of the dominant dilated duct within the left lobe of the liver was accessed with an additional [place] needle. A Nitrex wire was advanced to the level of the malignant obstruction at the level of the biliary hilum. Again, there is difficulty advancing the inner 3 French catheter from the [place] set and as such, a stiff micro puncture sheath sheath was utilized to secure the access in to biliary tree and allowed for placement of an Amplatz wire which was coiled within the biliary hilum. Under intermittent fluoroscopic guidance, the micropuncture sheath was exchanged for a a 4 French angled glide catheter and ultimately, a Kumpe catheter, which was utilized to manipulate the Amplatz wire through the common bile duct to the level of the duodenum. Contrast injection confirmed appropriate positioning. The Kumpe catheter was utilized for measurement purposes. Additional sideholes were cut approximately 2 cm peripheral to the radiopaque marker of an 8 French percutaneous biliary drainage catheter. Under intermittent fluoroscopic guidance, an 8 French percutaneous biliary drainage catheter was advanced over an Amplatz wire with tip ultimately coiled within the descending duodenum. Contrast injection was performed demonstrating appropriate positioning and functionality of the biliary drainage catheter. Multiple spot fluoroscopic radiographic image were obtained  in various obliquities. The catheter was connected to a drainage bag which yielded the brisk return of clear bile. The catheter was secured to the skin with an interrupted suture. The patient tolerated the procedure well without immediate postprocedural complication. FINDINGS: Ultrasound scanning demonstrates several hypoechoic lesions within the left lobe of liver compatible with known hepatic metastatic disease. Moderate intrahepatic biliary duct dilatation was again demonstrated within the left lobe of the liver Ultimately, under direct ultrasound guidance, a peripherally dilated duct in the left lobe of the liver was accessed allowing placement of a 8 French percutaneous biliary drainage catheter with and coiled and locked within the duodenum. Note, an 8 [place] percutaneous drain was placed in lieu of a 10 [place] as there was difficulty advancing the Accustick set due to the cranial location of the left lobe of the liver and required angulation of the access bile duct. IMPRESSION: Successful placement of an 8 French percutaneous biliary drainage catheter with end coiled and locked within the duodenum. PLAN: - Patient  will be followed with daily CMP levels in hopes of improvement in serum bilirubin levels. - Pending the trend of the patient's LFTs, the patient will likely return for repeat cholangiogram and potential biliary drainage catheter exchange and up sizing with potential capping later this week. - Ultimately, the patient's bilirubin will need to improve 2 to be a candidate for Y 90 Radioembolization. Electronically Signed   By: Sandi Mariscal M.D.   On: 09/06/2016 11:15   Ir Exchange Biliary Drain  Result Date: 09/18/2016 INDICATION: Metastatic breast carcinoma with biliary obstruction, status post internal/external drain catheter placement 09/06/2016. There was an initial improvement in the patient's bilirubin, which has since elevated above preprocedure level. EXAM: CHOLANGIOGRAM THROUGH EXISTING  CATHETER EXCHANGE AND REVISION OF INTERNAL-EXTERNAL BILIARY DRAIN MEDICATIONS: Zosyn 3.375 g IV; The antibiotic was administered within an appropriate time frame prior to the initiation of the procedure. ANESTHESIA/SEDATION: Intravenous Fentanyl and Versed were administered as conscious sedation during continuous monitoring of the patient's level of consciousness and physiological / cardiorespiratory status by the radiology RN, with a total moderate sedation time of 15 minutes. PROCEDURE: Informed written consent was obtained from the patient after a thorough discussion of the procedural risks, benefits and alternatives. All questions were addressed. Maximal Sterile Barrier Technique was utilized including caps, mask, sterile gowns, sterile gloves, sterile drape, hand hygiene and skin antiseptic. A timeout was performed prior to the initiation of the procedure. Lidocaine 1% was administered subcutaneously around the catheter. Cholangiogram through the existing internal -external biliary drain catheter was performed. The catheter was cut and exchanged over a Bentson wire for a 5 [place] Kumpe catheter, advanced into the distal duodenum. This was then exchanged over an Amplatz wire for serial vascular dilators which facilitated advancement of a 12 [place] internal external biliary drain catheter, placed with side hole spanning the central left bile duct through the CBD into the duodenum. Catheter injection confirms appropriate positioning. The patient tolerated the procedure well. FLUOROSCOPY TIME:  2.2 minutes, 761  uGym2 DAP COMPLICATIONS: None immediate. FINDINGS: The initial cholangiogram demonstrated stable position of the previously placed 8 [place] internal external drain catheter with additional sideholes. The left biliary tree was decompressed. The posterior right biliary tree was incompletely opacified but appear decompressed. The anterior right biliary tree was dilated centrally with focal high-grade stenosis  near the confluence with the left duct. The catheter was exchanged and upsized to a 12 [place] internal -external drain catheter as above, without complication. IMPRESSION: 1. Continued patency of the previously placed biliary drain catheter with decompression of the left biliary system and a portion of the posterior right biliary tree. 2. Technically successful exchange and up-sizing to a 12 [place] internal external device, placed to external drainage. Electronically Signed   By: Lucrezia Europe M.D.   On: 09/18/2016 16:55   Ct Angio Abd/pel W/ And/or W/o  Result Date: 09/05/2016 CLINICAL DATA:  History of metastatic breast cancer with progressive hepatic metastatic disease. Please perform CTA for evaluation of candidacy for Y 90 radioembolization. EXAM: CTA ABDOMEN AND PELVIS WITH CONTRAST TECHNIQUE: Multidetector CT imaging of the abdomen and pelvis was performed using the standard protocol during bolus administration of intravenous contrast. Multiplanar reconstructed images and MIPs were obtained and reviewed to evaluate the vascular anatomy. CONTRAST:  100 cc Isovue 370 COMPARISON:  Abdominal MRI - 08/29/2016 ; 07/24/2016; PET-CT -05/14/2016 FINDINGS: VASCULAR Aorta: Minimal amount of atherosclerotic plaque within a normal caliber abdominal aorta, not resulting in hemodynamically significant stenosis. No abdominal aortic dissection or periaortic  stranding. Celiac: Widely patent. Conventional takeoff of the GDA. Note is made of separate origins of the left lateral hepatic segmental artery with middle hepatic artery which supplies the medial segment of the left lobe of the liver as well as a portion of the anterior segment of the right lobe of the liver. Otherwise, conventional branching pattern. The right gastric artery is not definitely identified. SMA: Widely patent.  Conventional branching pattern. Renals: Solitary bilaterally. The bilateral renal arteries are widely patent without hemodynamically significant  narrowing. No vessel irregularity to suggest FMD. IMA: Widely patent. Inflow: The bilateral common, external and internal iliac artery is are tortuous lobe normal caliber and widely patent without hemodynamically significant stenosis. Proximal Outflow: The bilateral common, superficial and deep femoral arteries appear widely patent throughout their imaged course. Veins: The IVC and pelvic venous system appears widely patent. Review of the MIP images confirms the above findings. NON-VASCULAR Lower chest: Limited visualization of the lower thorax demonstrates consolidative opacities within the imaged left lower lobe with associated volume loss and trace left-sided pleural effusion. Known hypermetabolic nodule within the medial basilar aspect the left pleural space is grossly unchanged measuring 1.5 cm in diameter (image 8, series 5). No new focal airspace opacities. Normal heart size.  No pericardial effusion. Hepatobiliary: Normal hepatic contour, however loculated fluid/bile is noted about the subcapsular aspect of the right lobe of the liver (image 29, series 16), similar to recent abdominal MRI. Re- demonstrated extensive hepatic metastatic disease, similar to recently obtained abdominal MRI with dominant lesion within the dome of the right lobe of liver measuring approximately 2.6 x 2.6 cm (image 24, series 6 dominant lesion within the subcapsular aspect the right lobe of the liver measuring approximately 3.0 x 3.7 cm (image 42, series 6) and dominant lesion within the subcapsular aspect the right lobe of the liver measuring approximately 7.5 x 4.2 cm (image 66). Re- demonstrated intrahepatic biliary duct dilatation with occlusion of the biliary system at the level the hilum. The main portal vein is tapered/narrowed at the level of the biliary hilum though needs patent. A laminated approximately 1.6 cm gallstone is noted within the fundus of the gallbladder (image 86, series 4) with associated minimal amount of  pericholecystic stranding. Pancreas: Normal appearance of the pancreas Spleen: Normal appearance of the spleen Adrenals/Urinary Tract: There is symmetric enhancement and excretion of the bilateral kidneys. No definite renal stones this postcontrast examination. No discrete renal lesions. No urine obstruction or perinephric stranding. Normal appearance the bilateral adrenal glands. Normal appearance of the urinary bladder given degree distention. Stomach/Bowel: Scattered colonic diverticulosis without evidence of diverticulitis. Normal appearance of the terminal ileum. The appendix is not visualized, however there is no pericecal inflammatory change. No pneumoperitoneum, pneumatosis or portal venous gas. Lymphatic: Compared to PET-CT performed 05/14/2016, there has been development of porta hepatis and retroperitoneal lymphadenopathy with index left-sided periaortic lymph node measuring 1.3 cm in greatest short axis diameter (image 70, series 4), index aortocaval lymph nodes measuring 1.2 cm (image 79) and 1.1 cm (image 100) and index gastrohepatic lymph ligament lymph node measuring 0.9 cm (image 46, series 4 Reproductive: Normal appearance of the pelvic organs. Small amount of free fluid in the pelvic cul-de-sac. Other: Regional soft tissues appear normal. Musculoskeletal: Moderate scoliotic curvature of the thoracolumbar spine with dominant caudal component convex to the left measuring approximately 30 degrees (as measured from the superior endplate of W23 to the inferior endplate of L4. Similar appearance of known pathologic fracture involving the right inferior  pubic ramus (image 205, series 4). Approximately 1.3 x 1.5 cm sclerotic lesion within the right-side of the T11 vertebral body is unchanged (image 15, series 16). Potential new approximately 1.8 x 1.7 cm lytic lesion involving the posterior aspects of the L3 vertebral body (sagittal image 115, series 9). IMPRESSION: VASCULAR 1. Minimal amount of  atherosclerotic plaque with a normal caliber abdominal aorta. Aortic Atherosclerosis (ICD10-I70.0). 2. Conventional anatomy amenable to Y 90 radioembolization. NON-VASCULAR 1. Compared to recent obtain abdominal MRI, unchanged extensive hepatic metastatic disease, primarily affecting the right lobe of the liver with obstruction of the biliary system at the level the hilum with associated intrahepatic biliary duct dilatation affecting both the right and left lobes of the liver. 2. Compared to PET-CT performed 05/14/2016, interval development of porta hepatis and retroperitoneal lymphadenopathy as detailed above. 3. Grossly unchanged metastatic disease within the imaged left lower lung / pleural as detailed above. 4. Potential new approximately 1.8 cm lytic lesion involving the posterior aspect of the L3 vertebral body. Further evaluation lumbar spine MRI could performed as clinically indicated. Electronically Signed   By: Sandi Mariscal M.D.   On: 09/05/2016 16:57    ELIGIBLE FOR AVAILABLE RESEARCH PROTOCOL: no  ASSESSMENT: 56 y.o. 56 y.o. BRCA negative [place] woman with stage IV breast cancer, admitted as a 24 hour observation after interventional radiology procedure causing transient bacteremia with riders  (1) S/p Right upper inner quadrant lumpectomy and sentinel lymph node sampling 03/15/2004 for a pT1c pN0. 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 Donella Stade in [place] completed July 2006  (4) the patient opted against adjuvant antiestrogen therapy  (5) genetics testing showed no BRCA mutations  (6) biopsy of a palpable right axillary mass 10/24/2009 showed invasive ductal  carcinoma, grade 3, estrogen receptor 100% positive, progesterone receptor 2% positive (alert score 5) HER-2 negative; no evidence of systemic disease on PET scanning  (7) completed 3 of 4 planned cycles of docetaxeland cyclophosphamideSeptember 2011, fourth cycle omitted because of marked elevations in liver function tests  (8) an right axillary lymph node dissection 03/06/2010 showed 3/8 lymph nodes removed to be involved by tumor, with extracapsular extension.  (9) 45 Gy radiation to the right axillary and right supraclavicular nodal areas, with capecitabine sensitization, completed March 2012   (10) intolerant of letrozole and exemestane; on tamoxifenwith interruptions September 2012 to March 2013, but then continuing on tamoxifen more continuously through March of 2015  (11) biopsy of mediastinal adenopathy 06/02/2013 shows invasive ductal carcinoma (gross cystic disease fluid protein positive, TTS-1 negative), estrogen receptor 80% positive, progesterone receptor 2% positive, HER-2 not amplified  (12) letrozolestarted March 2015-- tolerated with significant side effects, discontinued at the end of May 2015  (13) PET scan 08/16/2013 shows extensive left pleural metastatic disease and a large left pleural effusion that shifts cardiac and mediastinal structures to the right; adenopathy (celiac trunk, periadrenal, periaortic); and a left medial clavicular lesion; Status post left thoracentesis 08/16/2013 positive for adenocarcinoma, estrogen receptor positive, progesterone receptor negative.  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; everolimus was suggested as an available targeted agent  (18) exemestanestarted 03/31/2014, discontinued 10/31/2014 with evidence of progression (a) everolimusadded 04/03/2014 but not tolerated (cytopenias, elevated LFTs) even at minimal doses; stopped 04/17/2014  (19) fulvestrantstarted 12/20/2014 (a) palbociclibadded at very low dose 04/03/2015 (starting dose 75 mg weekly) (b) palbociclib dose gradually increased to 75 mg daily, 21/7, as of May 2017 (c) palbociclib dose increased to 100 mg daily, 21/ 7, beginning November mid- cycle (d) palbociclib dose decreased to 75 mg daily beginning with cycle starting 05/25/2016 (e) letrozole 2.5 mg started 05/26/2016  (20) liver biopsy 03/20/2015 confirms metastatic carcinoma, still estrogen receptor positive at 100%, progesterone receptor negative, HER-2 equivocal with a signals ratio 1.41, number per cell 4.50.  (a) repeat liver biopsy December 2017 might show further changes (HER-2 positivity)  (21) immunohistochemistry for mismatched repair protein mutations 03/20/2015 showed normalmajor and minor MMR proteins, with a very low probability of microsatellite instability ([id number])  (22) adjuvant radiation 12/24/15-01/02/16 Site/dose:1) Left T9 Rib / 24 Gy in 8 fx 2) Right inferior pelvis/ 24 Gy in 8 fx  (23) consider pembrolizumab (obtained compassionate release from company) (a) liver biopsy 03/20/2015 negative for PD-1   (24) 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 (a) s/p L hepatic approach transhepatic 8 Fr biliary drainage catheter placed 09/06/2016 with end coiled and locked within duodenum.  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 treated with Lovenox for 5-6 months  (c) right upper extremity lymphedema--receiving physical therapy  (d) hepatic steatosis with chronically elevated LFTs as well as unusual hepatic sensitivity to chemotherapy  (e) osteopenia with the lowest T score -1.6 on bone density scan 06/20/2013 (i) ondenosumab/ [id number]  (f) radiation oncology (Dr Valere Dross) has reviewed prior radiation records in case there is further mediastinal involvement with dysphagia etc in which case palliative XRT could be considered (a) radiation to left mediastinum/ left 7th rib 3250 cGy in 13 sessions04/18/2016 through 08/02/2014 (b) radiation to T11 area: 22 Gy in 7 sessions, last dose 11/27/2014 (c) radiation left parietal scalp region to be completed 12/22/2014 (d) radiation to sacral area completed 04/09/2015 (e) radiation to right inferior pelvis and left ninth rib (24 gray, 12/24/2015--01/02/2016) (f) T11 was treated stereotactically with 14 gray in 1 fraction 03/12/2016.  (g) chest wall and perineal pain--improved post radiation treatments (a) discussed celebrex/ carafate but holding off for now  (h) zoster diagnosed 04/04/2015-- on valacyclovir--resolved  (i) hypocalcemia-- stable on supplementation; Mg++ WNL  (j) hyponatremia: improved on fluid restriction  (k) anasarca: due to hypoalbuminemia; advised to avoid salt  PLAN: We will give additional doses of Zosyn intravenously and at discharged continue antibiotics orally for a minimum of 5 days. While in the hospital we will continue the patient's home medications, which have been very effective in terms of pain control and as bowel prophylaxis.  I am hopeful we will be able to discharge the patient after 24 hours of  observation. I will reassess tomorrow and consider either making this a full admission or discharging to home.  Chauncey Cruel, MD   09/18/2016 5:39 PM Medical Oncology and Hematology [facility] [address] [place], [place] [address] Tel. [phone]    Fax. [phone]

## 2016-09-18 NOTE — H&P (Signed)
Referring Physician(s): Magrinat,G  Supervising Physician: Arne Cleveland  Patient Status:  [facility]  Chief Complaint:  Metastatic breast cancer with biliary obstruction  Subjective: Patient familiar to IR service from prior thoracentesis and left chest catheter/pleurx placement in 2015, removed on 12/14/14, liver lesion biopsy in December 2016, as well as left upper extremity PICC 09/05/16 and PTC with internal/external biliary drain on 09/06/16. She has a known history of metastatic breast cancer with biliary obstruction . Her LFTs have recently begun to increase over the past week and she presents again today for follow-up cholangiogram with possible biliary drain upsizing/manipulation. She currently denies fever, chest pain, or abnormal bleeding. She does have occasional headaches, some dyspnea with exertion, occasional cough, intermittent abdominal/back pain, recent nausea and vomiting and fatigue/weakness. Past Medical History:  Diagnosis Date  . Bone metastases ([place]) dx'd 05/2014  . Breast cancer ([place]) dx'd 2005/2011  . Peripheral vascular disease ([place]) 02/2010   blood clot related to porta cath  . PONV (postoperative nausea and vomiting)   . S/P radiation therapy 07/17/2014 through 08/02/2014    Left mediastinum, left seventh rib 3250 cGy in 13 sessions   . S/P radiation therapy 12/11/2014 through 12/22/2014    Left parietal calvarium 2400 cGy in 8 sessions   . Seizures ([place]) 2010   Isolated incident.   Past Surgical History:  Procedure Laterality Date  . AXILLARY LYMPH NODE DISSECTION  Dec. 2011  . BREAST LUMPECTOMY  2005  . IR FLUORO GUIDE CV LINE LEFT  09/05/2016  . IR GENERIC HISTORICAL  05/21/2016   IR RADIOLOGIST EVAL & MGMT 05/21/2016 Sandi Mariscal, MD [facility] INTERV RAD  . IR INT EXT BILIARY DRAIN WITH CHOLANGIOGRAM  09/06/2016  .  IR US GUIDE VASC ACCESS LEFT  09/05/2016  . MEDIASTINOTOMY CHAMBERLAIN MCNEIL Left 06/02/2013   Procedure: MEDIASTINOTOMY CHAMBERLAIN MCNEIL;  Surgeon: [name], MD;  Location: [place];  Service: Thoracic;  Laterality: Left;  LEFT ANTERIOR MEDIASTINOTOMY   . PORTACATH PLACEMENT  12/11  . removal portacath        Allergies: 2nd skin quick heal; Decadron [dexamethasone]; Dilaudid [hydromorphone]; Enoxaparin; Fluconazole; Hydromorphone hcl; Morphine and related; Ondansetron; Protonix [pantoprazole sodium]; and Tegaderm ag mesh [silver]  Medications: Prior to Admission medications   Medication Sig Start Date End Date Taking? Authorizing Provider  alum & mag hydroxide-simeth (MAALOX/MYLANTA) 200-200-20 MG/5ML suspension Take 30 mLs by mouth every 6 (six) hours as needed for indigestion, heartburn or flatulence (Bloating,gas pain). 09/11/16   Magrinat, Virgie Dad, MD  B Complex-C (B-COMPLEX WITH VITAMIN C) tablet Take 1 tablet by mouth daily. Reported on 03/27/2015 01/19/15   Magrinat, Virgie Dad, MD  betamethasone valerate ointment (VALISONE) 0.1 % Apply 1 application topically 2 (two) times daily. 09/15/16   Magrinat, Virgie Dad, MD  calcium-vitamin D (OSCAL WITH D) 500-200 MG-UNIT tablet Take 2 tablets by mouth 2 (two) times daily. 09/11/16   Magrinat, Virgie Dad, MD  cholecalciferol 2000 UNITS tablet Take 1 tablet (2,000 Units total) by mouth daily. 01/19/15   Magrinat, Virgie Dad, MD  docusate sodium (COLACE) 100 MG capsule Take 1 capsule (100 mg total) by mouth 2 (two) times daily. 09/11/16   Magrinat, Virgie Dad, MD  famotidine (PEPCID) 40 MG tablet Take 0.5-1 tablets (20-40 mg total) by mouth 2 (two) times daily. 40 mg in the morning and 20 mg in the evening Patient taking differently: Take 20 mg by mouth 2 (two) times daily.  08/30/16   Caren Griffins, MD  folic acid (FOLVITE) 1 MG tablet Take 1 tablet (1 mg total) by mouth daily. 01/19/15   Magrinat, Virgie Dad, MD  ketorolac (TORADOL) 10 MG  tablet Take 1 tablet (10 mg total) by mouth every 6 (six) hours as needed. 09/11/16   Magrinat, Virgie Dad, MD  LORazepam (ATIVAN) 0.5 MG tablet Take 1 tablet (0.5 mg total) by mouth every 8 (eight) hours as needed for anxiety. 08/30/16 08/30/17  Caren Griffins, MD  Melatonin 3 MG TABS Take 3 mg by mouth at bedtime.    [provider]  Morphine Sulfate (MORPHINE CONCENTRATE) 10 MG/0.5ML SOLN concentrated solution Take 0.5 mLs (10 mg total) by mouth every 2 (two) hours as needed for moderate pain or shortness of breath. 09/11/16   Magrinat, Virgie Dad, MD  polyethylene glycol (MIRALAX / GLYCOLAX) packet Take 17 g by mouth daily. 09/11/16   Magrinat, Virgie Dad, MD  promethazine (PHENERGAN) 12.5 MG tablet Take 1 tablet (12.5 mg total) by mouth every 6 (six) hours as needed for nausea. 09/11/16   Magrinat, Virgie Dad, MD  saccharomyces boulardii (FLORASTOR) 250 MG capsule Take 250 mg by mouth daily.  03/23/15   Magrinat, Virgie Dad, MD  traMADol (ULTRAM) 50 MG tablet Take 1-2 tablets (50-100 mg total) by mouth every 6 (six) hours as needed. 09/11/16   Magrinat, Virgie Dad, MD  triamterene-hydrochlorothiazide (DYAZIDE) 37.5-25 MG capsule Take 1 each (1 capsule total) by mouth daily as needed (weight gain). 09/15/16   Magrinat, Virgie Dad, MD     Vital Signs: BP (!) 162/89 (BP Location: Right Arm)   Pulse 94   Temp 97.5 F (36.4 C) (Oral)   Resp 16   SpO2 100%   Physical Exam awake, alert. Marked jaundice/scleral icterus; chest with diminished breath sounds bases. Heart with regular rate and rhythm. Abdomen soft, positive bowel sounds, intact biliary drain; mildly tender right upper quadrant /epigastric region to palpation; extremities with 1-2+ edema bilaterally, thigh high stockings in place  Imaging: No results found.  Labs:  CBC:  Recent Labs  09/06/16 0607 09/10/16 0506 09/11/16 0843 09/15/16 1131  WBC 9.6 17.6* 17.8* 8.4  HGB 10.4* 8.6* 9.0* 8.9*  HCT 29.7* 25.0* 25.2* 25.2*  PLT 473* 373  396 428*    COAGS:  Recent Labs  08/27/16 0404 08/28/16 0721 08/29/16 0508 09/05/16 1148  INR 1.07 1.07 1.11 1.30*    BMP:  Recent Labs  09/08/16 0405 09/09/16 0536 09/10/16 0506 09/11/16 0843 09/15/16 1131  NA 126* 123* 127* 126* 123*  K 3.7 3.8 3.6 3.4* 4.7  CL 97* 94* 97* 96*  --   CO2 22 20* _0 GLUCOSE 95 93 84 91 87  BUN _1 11.9  CALCIUM 6.4* 6.2* 6.6* 7.4* 9.3  CREATININE <0.30* 0.46 0.37* 0.47 0.7  GFRNONAA NOT CALCULATED >60 >60 >60  --   GFRAA NOT CALCULATED >60 >60 >60  --     LIVER FUNCTION TESTS:  Recent Labs  09/09/16 0536 09/10/16 0506 09/11/16 0843 09/15/16 1131  BILITOT 15.4* 13.6* 14.8* 18.65*  AST 156* 155* 187* 273*  ALT 254* 212* 222* 249*  ALKPHOS 401* 334* 331* 560*  PROT 4.9* 4.5* 4.8* 4.8*  ALBUMIN 2.0* 1.8* 1.9* 1.9*    Assessment and Plan: Patient with history of metastatic breast carcinoma with biliary obstruction, status post PTC with internal/external biliary drain placement on 09/06/16; LFT's have increased slightly over the past week and she presents again today for follow-up cholangiogram with  possible biliary drain upsizing/manipulation. Details/risks of procedure, including but not limited to, internal bleeding, infection/sepsis, injury to adjacent structures, inability to significantly improve liver function tests discussed with patient and family with their understanding and consent. Labs pending.    Electronically Signed: D. Rowe Robert, PA-C 09/18/2016, 1:14 PM   I spent a total of  25 minutes at the the patient's bedside AND on the patient's hospital floor or unit, greater than 50% of which was counseling/coordinating care for cholangiogram with possible biliary drain upsizing/manipulation

## 2016-09-18 NOTE — Progress Notes (Signed)
Patient ID: [name] [name], female   DOB: [birth year], 56 y.o.   MRN: [id number] Nurse called to report patient having shaking chills upon arrival to short stay center post cholangiogram and biliary drain upsizing today; temp 99. Above discussed with Dr. Vernard Gambles as well as Dr. Jana Hakim; will administer IV fluids, place patient on O2, and continue IV Zosyn every 8 hours; will administer IV Demerol for rigors. Patient will most likely require overnight observation to ensure stability. Above discussed with patient and family.

## 2016-09-18 NOTE — Procedures (Signed)
1. Cholangiogram 2. Exchange/upsize int/ext biliary drain  No complication No blood loss. See complete dictation in [facility].  Dillard Cannon MD Main # [phone] Pager  [phone]

## 2016-09-18 NOTE — Discharge Instructions (Signed)
Moderate Conscious Sedation, Adult, Care After These instructions provide you with information about caring for yourself after your procedure. Your health care provider may also give you more specific instructions. Your treatment has been planned according to current medical practices, but problems sometimes occur. Call your health care provider if you have any problems or questions after your procedure. What can I expect after the procedure? After your procedure, it is common:  To feel sleepy for several hours.  To feel clumsy and have poor balance for several hours.  To have poor judgment for several hours.  To vomit if you eat too soon.  Follow these instructions at home: For at least 24 hours after the procedure:   Do not: ? Participate in activities where you could fall or become injured. ? Drive. ? Use heavy machinery. ? Drink alcohol. ? Take sleeping pills or medicines that cause drowsiness. ? Make important decisions or sign legal documents. ? Take care of children on your own.  Rest. Eating and drinking  Follow the diet recommended by your health care provider.  If you vomit: ? Drink water, juice, or soup when you can drink without vomiting. ? Make sure you have little or no nausea before eating solid foods. General instructions  Have a responsible adult stay with you until you are awake and alert.  Take over-the-counter and prescription medicines only as told by your health care provider.  If you smoke, do not smoke without supervision.  Keep all follow-up visits as told by your health care provider. This is important. Contact a health care provider if:  You keep feeling nauseous or you keep vomiting.  You feel light-headed.  You develop a rash.  You have a fever. Get help right away if:  You have trouble breathing. This information is not intended to replace advice given to you by your health care provider. Make sure you discuss any questions you have  with your health care provider. Document Released: 01/05/2013 Document Revised: 08/20/2015 Document Reviewed: 07/07/2015 Elsevier Interactive Patient Education  2018 [employer]   Biliary Drainage Catheter Placement (Exchange), Care After This sheet gives you information about how to care for yourself after your procedure. Your health care provider may also give you more specific instructions. If you have problems or questions, contact your health care provider. What can I expect after the procedure? After the procedure, it is common to have:  Pain or soreness at the catheter insertion site.  Tiredness and sleepiness for several hours.  Some bruising at the catheter insertion site.  Drainage into the collection bag on the outside of your body, if you have an external drainage catheter. ? You might see bloody discharge in the bag for the first 1 or 2 days. ? Then, the discharge should turn a yellow-green color.  Follow these instructions at home: Medicines  Take over-the-counter and prescription medicines for pain, discomfort, or fever only as told by your health care provider.  Do not take aspirin or blood thinners unless your health care provider says that you can. These can make bleeding worse.  Do not drive or use heavy machinery while taking prescription pain medicine. Catheter insertion site care  Clean the catheter insertion site as told by your health care provider.  Do not take baths, swim, or use a hot tub until your health care provider approves.  Take showers only. Before showering, cover the catheter insertion area with a watertight covering to keep the area dry.  Keep the skin  around the catheter insertion site dry. If the area gets wet, dry the skin completely.  Check your catheter insertion site every day for signs of infection. Check for: ? Redness, swelling, or pain. ? Fluid or blood. ? Warmth. ? Pus or a bad smell. General instructions  Rest for the  remainder of the day.  Do not drive, use machinery, or make legal decisions for 24 hours after your procedure.  Resume your usual diet. Avoid alcoholic beverages for 24 hours after your procedure.  Keep all follow-up visits as told by your health care provider. This is important.  Drink enough fluid to keep your urine clear or pale yellow. Contact a health care provider if:  Your pain gets worse after it had improved, and it is not relieved with pain medicines.  You have any questions about caring for your drainage catheter or collection bag.  You have any of these around your catheter insertion site or coming from it: ? Skin breakdown. ? Redness, swelling, or pain. ? Fluid or blood. ? Warmth to the touch. ? Pus or a bad smell. Get help right away if:  You have a fever or chills.  Your redness, swelling, or pain at the catheter insertion site gets worse, even though you are cleaning it well.  You have leakage of bile around the drainage catheter.  Your drainage catheter becomes blocked or clogged.  Your drainage catheter comes out. This information is not intended to replace advice given to you by your health care provider. Make sure you discuss any questions you have with your health care provider. Document Released: 10/30/2003 Document Revised: 02/04/2016 Document Reviewed: 02/04/2016 Elsevier Interactive Patient Education  2017 [employer].

## 2016-09-18 NOTE — Progress Notes (Signed)
Patient admitted to room 1415 at 1850. Oriented to room, call bell, and telephone. Family at bedside.

## 2016-09-18 NOTE — Progress Notes (Signed)
Pt transferred to room 1415 per stretcher and report given to [employer]. Pt placed in recliner with 2-3 person assist. Pts husband at bedside and female family member. Dr Jana Hakim in to see pt.

## 2016-09-19 ENCOUNTER — Ambulatory Visit: Payer: 59 | Admitting: Oncology

## 2016-09-19 ENCOUNTER — Other Ambulatory Visit: Payer: 59

## 2016-09-19 ENCOUNTER — Encounter: Payer: 59 | Admitting: Physical Therapy

## 2016-09-19 ENCOUNTER — Encounter (HOSPITAL_COMMUNITY): Payer: Self-pay

## 2016-09-19 DIAGNOSIS — K8001 Calculus of gallbladder with acute cholecystitis with obstruction: Secondary | ICD-10-CM | POA: Diagnosis not present

## 2016-09-19 LAB — CBC WITH DIFFERENTIAL/PLATELET
BASOS ABS: 0 10*3/uL (ref 0.0–0.1)
Basophils Relative: 0 %
EOS ABS: 0.1 10*3/uL (ref 0.0–0.7)
EOS PCT: 0 %
HCT: 22.8 % — ABNORMAL LOW (ref 36.0–46.0)
Hemoglobin: 8.3 g/dL — ABNORMAL LOW (ref 12.0–15.0)
Lymphocytes Relative: 1 %
Lymphs Abs: 0.2 10*3/uL — ABNORMAL LOW (ref 0.7–4.0)
MCH: 34.2 pg — ABNORMAL HIGH (ref 26.0–34.0)
MCHC: 36.4 g/dL — ABNORMAL HIGH (ref 30.0–36.0)
MCV: 93.8 fL (ref 78.0–100.0)
Monocytes Absolute: 0.4 10*3/uL (ref 0.1–1.0)
Monocytes Relative: 2 %
Neutro Abs: 16.9 10*3/uL — ABNORMAL HIGH (ref 1.7–7.7)
Neutrophils Relative %: 97 %
Platelets: 367 10*3/uL (ref 150–400)
RBC: 2.43 MIL/uL — AB (ref 3.87–5.11)
RDW: 16.6 % — ABNORMAL HIGH (ref 11.5–15.5)
WBC: 17.5 10*3/uL — AB (ref 4.0–10.5)

## 2016-09-19 LAB — COMPREHENSIVE METABOLIC PANEL
ALT: 200 U/L — AB (ref 14–54)
AST: 291 U/L — AB (ref 15–41)
Albumin: 1.9 g/dL — ABNORMAL LOW (ref 3.5–5.0)
Alkaline Phosphatase: 551 U/L — ABNORMAL HIGH (ref 38–126)
Anion gap: 10 (ref 5–15)
BUN: 13 mg/dL (ref 6–20)
CO2: 25 mmol/L (ref 22–32)
CREATININE: 0.73 mg/dL (ref 0.44–1.00)
Calcium: 8.2 mg/dL — ABNORMAL LOW (ref 8.9–10.3)
Chloride: 89 mmol/L — ABNORMAL LOW (ref 101–111)
GFR calc Af Amer: >60 mL/min (ref >60)
GFR calc non Af Amer: >60 mL/min (ref >60)
Glucose, Bld: 97 mg/dL (ref 65–99)
Potassium: 4.6 mmol/L (ref 3.5–5.1)
SODIUM: 124 mmol/L — AB (ref 135–145)
Total Bilirubin: 20.3 mg/dL (ref 0.3–1.2)
Total Protein: 4.8 g/dL — ABNORMAL LOW (ref 6.5–8.1)

## 2016-09-19 MED ORDER — KETOROLAC TROMETHAMINE 10 MG PO TABS
10.0000 mg | ORAL_TABLET | Freq: Four times a day (QID) | ORAL | Status: DC
  Administered 2016-09-19: 10 mg via ORAL
  Filled 2016-09-19: qty 1

## 2016-09-19 MED ORDER — MELATONIN 3 MG PO TABS: 3.0000 mg | ORAL_TABLET | Freq: Every day | ORAL | Status: DC

## 2016-09-19 MED ORDER — POLYETHYLENE GLYCOL 3350 17 G PO PACK
17.0000 g | PACK | Freq: Every day | ORAL | Status: DC
  Administered 2016-09-19 – 2016-09-20 (×2): 17 g via ORAL
  Filled 2016-09-19 (×2): qty 1

## 2016-09-19 MED ORDER — B COMPLEX-C PO TABS
1.0000 | ORAL_TABLET | Freq: Every day | ORAL | Status: DC
  Filled 2016-09-19 (×2): qty 1

## 2016-09-19 MED ORDER — SACCHAROMYCES BOULARDII 250 MG PO CAPS
250.0000 mg | ORAL_CAPSULE | Freq: Every day | ORAL | Status: DC
  Filled 2016-09-19 (×3): qty 1

## 2016-09-19 MED ORDER — LORAZEPAM 0.5 MG PO TABS
0.5000 mg | ORAL_TABLET | Freq: Three times a day (TID) | ORAL | Status: DC | PRN
Start: 2016-09-19 — End: 2016-09-21

## 2016-09-19 MED ORDER — FAMOTIDINE 20 MG PO TABS: 20.0000 mg | ORAL_TABLET | Freq: Two times a day (BID) | ORAL | Status: DC

## 2016-09-19 MED ORDER — PROMETHAZINE HCL 25 MG PO TABS: 12.5000 mg | ORAL_TABLET | Freq: Four times a day (QID) | ORAL | Status: DC | PRN

## 2016-09-19 MED ORDER — FAMOTIDINE 20 MG PO TABS
40.0000 mg | ORAL_TABLET | Freq: Every day | ORAL | Status: DC
  Administered 2016-09-19 – 2016-09-20 (×2): 40 mg via ORAL
  Filled 2016-09-19 (×2): qty 2

## 2016-09-19 MED ORDER — FOLIC ACID 1 MG PO TABS
1.0000 mg | ORAL_TABLET | Freq: Every day | ORAL | Status: DC
  Filled 2016-09-19 (×3): qty 1

## 2016-09-19 MED ORDER — DOCUSATE SODIUM 100 MG PO CAPS
100.0000 mg | ORAL_CAPSULE | Freq: Two times a day (BID) | ORAL | Status: DC
  Administered 2016-09-19 – 2016-09-20 (×4): 100 mg via ORAL
  Filled 2016-09-19 (×5): qty 1

## 2016-09-19 MED ORDER — TRAMADOL HCL 50 MG PO TABS
50.0000 mg | ORAL_TABLET | Freq: Four times a day (QID) | ORAL | Status: DC | PRN
  Administered 2016-09-20 – 2016-09-21 (×3): 50 mg via ORAL
  Filled 2016-09-19: qty 2
  Filled 2016-09-19 (×3): qty 1

## 2016-09-19 MED ORDER — FAMOTIDINE 20 MG PO TABS
40.0000 mg | ORAL_TABLET | Freq: Once | ORAL | Status: DC
  Filled 2016-09-19: qty 2

## 2016-09-19 MED ORDER — CALCIUM CARBONATE-VITAMIN D 500-200 MG-UNIT PO TABS
2.0000 | ORAL_TABLET | Freq: Two times a day (BID) | ORAL | Status: DC
  Administered 2016-09-19 (×2): 2 via ORAL
  Filled 2016-09-19 (×2): qty 2

## 2016-09-19 MED ORDER — ALUM & MAG HYDROXIDE-SIMETH 200-200-20 MG/5ML PO SUSP: 30.0000 mL | Freq: Four times a day (QID) | ORAL | Status: DC | PRN

## 2016-09-19 MED ORDER — MORPHINE SULFATE (CONCENTRATE) 10 MG/0.5ML PO SOLN
10.0000 mg | ORAL | Status: DC | PRN
  Administered 2016-09-19: 10 mg via ORAL
  Filled 2016-09-19: qty 0.5

## 2016-09-19 MED ORDER — VITAMIN D3 25 MCG (1000 UNIT) PO TABS
2000.0000 [IU] | ORAL_TABLET | Freq: Every day | ORAL | Status: DC
  Administered 2016-09-19: 2000 [IU] via ORAL
  Filled 2016-09-19: qty 2

## 2016-09-19 MED ORDER — FAMOTIDINE 20 MG PO TABS
20.0000 mg | ORAL_TABLET | Freq: Every day | ORAL | Status: DC
  Filled 2016-09-19: qty 1

## 2016-09-19 MED ORDER — OXYCODONE HCL 5 MG/5ML PO SOLN
5.0000 mg | ORAL | Status: DC | PRN
  Filled 2016-09-19: qty 10

## 2016-09-19 MED ORDER — KETOROLAC TROMETHAMINE 10 MG PO TABS
15.0000 mg | ORAL_TABLET | Freq: Four times a day (QID) | ORAL | Status: DC
  Administered 2016-09-19 – 2016-09-21 (×8): 15 mg via ORAL
  Filled 2016-09-19 (×10): qty 2

## 2016-09-19 MED ORDER — TRIAMTERENE-HCTZ 37.5-25 MG PO TABS
1.0000 | ORAL_TABLET | Freq: Every day | ORAL | Status: DC | PRN
  Filled 2016-09-19: qty 1

## 2016-09-19 NOTE — Care Management Note (Signed)
Case Management Note  Patient Details  Name: [name] [name] MRN: [id number] Date of Birth: 19-Jun-1960  Subjective/Objective:  Spoke to Nurse Valerie-Dr Magrinat's nurse-patient will continue w/the same [facility] TIW-picc flushes,drain care,dag changes. Patient/spouse in agreement to this plan. [facility] rep Kim aware, & will talk to patient prior to d/c. Patient may d/c in am.                  Action/Plan:d/c home w/HHC.   Expected Discharge Date:  09/18/16               Expected Discharge Plan:  [place]  In-House Referral:     Discharge planning Services  CM Consult  Post Acute Care Choice:  Home Health [facility] active-HHRN-picc flush, drain care/dsg changes) Choice offered to:  Patient  DME Arranged:    DME Agency:     HH Arranged:  RN [place] Agency:  [facility]  Status of Service:  In process, will continue to follow  If discussed at Long Length of Stay Meetings, dates discussed:    Additional Comments:  Dessa Phi, RN 09/19/2016, 4:01 PM

## 2016-09-19 NOTE — Progress Notes (Signed)
Referring Physician(s): Magrinat,G  Supervising Physician: Daryll Brod  Patient Status:  [facility] - In-pt  Chief Complaint:  Metastatic breast cancer to liver, biliary obstruction  Subjective: Patient sitting up in chair; family in room; doing fairly well; denies worsening abd pain, nausea or vomiting. Persistent jaundice.   Allergies: 2nd skin quick heal; Decadron [dexamethasone]; Dilaudid [hydromorphone]; Enoxaparin; Fluconazole; Hydromorphone hcl; Morphine and related; Ondansetron; Protonix [pantoprazole sodium]; and Tegaderm ag mesh [silver]  Medications: Prior to Admission medications   Medication Sig Start Date End Date Taking? Authorizing Provider  alum & mag hydroxide-simeth (MAALOX/MYLANTA) 200-200-20 MG/5ML suspension Take 30 mLs by mouth every 6 (six) hours as needed for indigestion, heartburn or flatulence (Bloating,gas pain). 09/11/16  Yes Magrinat, Virgie Dad, MD  B Complex-C (B-COMPLEX WITH VITAMIN C) tablet Take 1 tablet by mouth daily. Reported on 03/27/2015 01/19/15  Yes Magrinat, Virgie Dad, MD  betamethasone valerate ointment (VALISONE) 0.1 % Apply 1 application topically 2 (two) times daily. 09/15/16  Yes Magrinat, Virgie Dad, MD  calcium-vitamin D (OSCAL WITH D) 500-200 MG-UNIT tablet Take 2 tablets by mouth 2 (two) times daily. 09/11/16  Yes Magrinat, Virgie Dad, MD  cholecalciferol 2000 UNITS tablet Take 1 tablet (2,000 Units total) by mouth daily. 01/19/15  Yes Magrinat, Virgie Dad, MD  docusate sodium (COLACE) 100 MG capsule Take 1 capsule (100 mg total) by mouth 2 (two) times daily. 09/11/16  Yes Magrinat, Virgie Dad, MD  famotidine (PEPCID) 40 MG tablet Take 0.5-1 tablets (20-40 mg total) by mouth 2 (two) times daily. 40 mg in the morning and 20 mg in the evening Patient taking differently: Take 40 mg by mouth 2 (two) times daily.  08/30/16  Yes Gherghe, Vella Redhead, MD  folic acid (FOLVITE) 1 MG tablet Take 1 tablet (1 mg total) by mouth daily. 01/19/15  Yes Magrinat, Virgie Dad, MD  ketorolac (TORADOL) 10 MG tablet Take 1 tablet (10 mg total) by mouth every 6 (six) hours as needed. Patient taking differently: Take 10 mg by mouth every 6 (six) hours.  09/11/16  Yes Magrinat, Virgie Dad, MD  LORazepam (ATIVAN) 0.5 MG tablet Take 1 tablet (0.5 mg total) by mouth every 8 (eight) hours as needed for anxiety. 08/30/16 08/30/17 Yes Gherghe, Vella Redhead, MD  Melatonin 3 MG TABS Take 3 mg by mouth at bedtime.   Yes [provider]  polyethylene glycol (MIRALAX / GLYCOLAX) packet Take 17 g by mouth daily. 09/11/16  Yes Magrinat, Virgie Dad, MD  saccharomyces boulardii (FLORASTOR) 250 MG capsule Take 250 mg by mouth daily.  03/23/15  Yes Magrinat, Virgie Dad, MD  traMADol (ULTRAM) 50 MG tablet Take 1-2 tablets (50-100 mg total) by mouth every 6 (six) hours as needed. Patient taking differently: Take 50-100 mg by mouth every 6 (six) hours.  09/11/16  Yes Magrinat, Virgie Dad, MD  triamterene-hydrochlorothiazide (DYAZIDE) 37.5-25 MG capsule Take 1 each (1 capsule total) by mouth daily as needed (weight gain). 09/15/16  Yes Magrinat, Virgie Dad, MD  Morphine Sulfate (MORPHINE CONCENTRATE) 10 MG/0.5ML SOLN concentrated solution Take 0.5 mLs (10 mg total) by mouth every 2 (two) hours as needed for moderate pain or shortness of breath. Patient not taking: Reported on 09/18/2016 09/11/16   Magrinat, Virgie Dad, MD  promethazine (PHENERGAN) 12.5 MG tablet Take 1 tablet (12.5 mg total) by mouth every 6 (six) hours as needed for nausea. Patient not taking: Reported on 09/18/2016 09/11/16   Magrinat, Virgie Dad, MD     Vital Signs: BP Marland Kitchen)  150/67 (BP Location: Left Leg)   Pulse 98   Temp 97.6 F (36.4 C) (Oral)   Resp 18   Ht 5\' 4"  (1.626 m)   Wt 176 lb (79.8 kg)   SpO2 98%   BMI 30.21 kg/m   Physical Exam awake, alert. Hepatic drain in place, output 50 mL bile; dressing clean and dry; site with minimal tenderness to palpation  Imaging: Ir Exchange Biliary Drain  Result Date:  09/18/2016 INDICATION: Metastatic breast carcinoma with biliary obstruction, status post internal/external drain catheter placement 09/06/2016. There was an initial improvement in the patient's bilirubin, which has since elevated above preprocedure level. EXAM: CHOLANGIOGRAM THROUGH EXISTING CATHETER EXCHANGE AND REVISION OF INTERNAL-EXTERNAL BILIARY DRAIN MEDICATIONS: Zosyn 3.375 g IV; The antibiotic was administered within an appropriate time frame prior to the initiation of the procedure. ANESTHESIA/SEDATION: Intravenous Fentanyl and Versed were administered as conscious sedation during continuous monitoring of the patient's level of consciousness and physiological / cardiorespiratory status by the radiology RN, with a total moderate sedation time of 15 minutes. PROCEDURE: Informed written consent was obtained from the patient after a thorough discussion of the procedural risks, benefits and alternatives. All questions were addressed. Maximal Sterile Barrier Technique was utilized including caps, mask, sterile gowns, sterile gloves, sterile drape, hand hygiene and skin antiseptic. A timeout was performed prior to the initiation of the procedure. Lidocaine 1% was administered subcutaneously around the catheter. Cholangiogram through the existing internal -external biliary drain catheter was performed. The catheter was cut and exchanged over a Bentson wire for a 5 [place] Kumpe catheter, advanced into the distal duodenum. This was then exchanged over an Amplatz wire for serial vascular dilators which facilitated advancement of a 12 [place] internal external biliary drain catheter, placed with side hole spanning the central left bile duct through the CBD into the duodenum. Catheter injection confirms appropriate positioning. The patient tolerated the procedure well. FLUOROSCOPY TIME:  2.2 minutes, 876  uGym2 DAP COMPLICATIONS: None immediate. FINDINGS: The initial cholangiogram demonstrated stable position of the  previously placed 8 [place] internal external drain catheter with additional sideholes. The left biliary tree was decompressed. The posterior right biliary tree was incompletely opacified but appear decompressed. The anterior right biliary tree was dilated centrally with focal high-grade stenosis near the confluence with the left duct. The catheter was exchanged and upsized to a 12 [place] internal -external drain catheter as above, without complication. IMPRESSION: 1. Continued patency of the previously placed biliary drain catheter with decompression of the left biliary system and a portion of the posterior right biliary tree. 2. Technically successful exchange and up-sizing to a 12 [place] internal external device, placed to external drainage. Electronically Signed   By: Lucrezia Europe M.D.   On: 09/18/2016 16:55    Labs:  CBC:  Recent Labs  09/11/16 0843 09/15/16 1131 09/18/16 1316 09/19/16 0323  WBC 17.8* 8.4 7.8 17.5*  HGB 9.0* 8.9* 8.7* 8.3*  HCT 25.2* 25.2* 23.6* 22.8*  PLT 396 428* 411* 367    COAGS:  Recent Labs  08/28/16 0721 08/29/16 0508 09/05/16 1148 09/18/16 1316  INR 1.07 1.11 1.30* 1.34    BMP:  Recent Labs  09/10/16 0506 09/11/16 0843 09/15/16 1131 09/18/16 1316 09/19/16 0323  NA 127* 126* 123* 122* 124*  K 3.6 3.4* 4.7 4.3 4.6  CL 97* 96*  --  87* 89*  CO2 22 22 25 26 25   GLUCOSE 84 91 87 91 97  BUN 12 11 11.9 12 13   CALCIUM 6.6* 7.4*  9.3 8.3* 8.2*  CREATININE 0.37* 0.47 0.7 0.62 0.73  GFRNONAA >60 >60  --  >60 >60  GFRAA >60 >60  --  >60 >60    LIVER FUNCTION TESTS:  Recent Labs  09/11/16 0843 09/15/16 1131 09/18/16 1316 09/19/16 0323  BILITOT 14.8* 18.65* 20.9* 20.3*  AST 187* 273* 268* 291*  ALT 222* 249* 206* 200*  ALKPHOS 331* 560* 581* 551*  PROT 4.8* 4.8* 5.1* 4.8*  ALBUMIN 1.9* 1.9* 2.2* 1.9*    Assessment and Plan: 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, biliary obstruction; status post upsizing of internal/external biliary drain  from 8 [place] to 12 [place] yesterday; currently afebrile; WBC 17.5(7.8); hemoglobin 8.3, creatinine 0.73, total bilirubin 20.3(20.9), additional LFTs slightly decreased; patient had some transient bacteremia yesterday post drain upsizing; rigors resolved with IV demerol; current bile cultures pending; recommend continued antibiotic therapy-check bile culture sensitivities when available; recommend every 8 hour normal saline flushing of biliary drain while inpatient and 3 times weekly as outpatient; dressing changes to site as needed; monitor LFTs; as long as total bilirubin remains significantly elevated would maintain drain to bag ;other plans as per Dr. Jana Hakim.   Electronically Signed: D. Rowe Robert, PA-C 09/19/2016, 1:59 PM   I spent a total of  20 minutes at the the patient's bedside AND on the patient's hospital floor or unit, greater than 50% of which was counseling/coordinating care for biliary drain    Patient ID: [name] [name], female   DOB: March 28, 1961, 56 y.o.   MRN: [id number]

## 2016-09-19 NOTE — Progress Notes (Signed)
PHARMACIST - PHYSICIAN ORDER COMMUNICATION  CONCERNING: P&T Medication Policy on Herbal Medications  DESCRIPTION:  This patient's order for:  Melatonin  has been noted.  This product(s) is classified as an "herbal" or natural product. Due to a lack of definitive safety studies or FDA approval, nonstandard manufacturing practices, plus the potential risk of unknown drug-drug interactions while on inpatient medications, the Pharmacy and Therapeutics Committee does not permit the use of "herbal" or natural products of this type within [facility].   ACTION TAKEN: The pharmacy department is unable to verify this order at this time and the order has been discontinued. Please reevaluate patient's clinical condition at discharge and address if the herbal or natural product(s) should be resumed at that time.  Royetta Asal, PharmD, BCPS Pager [phone] 09/19/2016 8:39 AM

## 2016-09-19 NOTE — Care Management Note (Signed)
Case Management Note  Patient Details  Name: [name] [name] MRN: [id number] Date of Birth: 1960/09/13  Subjective/Objective: 56 y/o f admitted w/ Abnormal LFT's. Hx: Breast Ca. Active w/[facility]-picc flush/biliary drain care rep [place] following. CM referral-additional [place] private duty care services. Spoke to patient in rm about HHC/Private duty care services-patient currently w/many concerns about [facility] HHC services/her insurance policy-she states she has unlimited [place] services/private duty care services. She did allow me to have [facility] rep come & talk to her directly about her concerns. I have also provided her w/HHC agency list(skilled services/ instruction)/private duty care agency list(independent choice/custodial level/out of pocket cost.  If HHC needed @ d/c will need specific [place] orders for services.                  Action/Plan:d/c plan home.   Expected Discharge Date:  09/18/16               Expected Discharge Plan:  [place]  In-House Referral:     Discharge planning Services  CM Consult  Post Acute Care Choice:  Home Health [facility] active-HHRN-picc flush, drain care) Choice offered to:  Patient  DME Arranged:    DME Agency:     HH Arranged:    [place] Agency:     Status of Service:  In process, will continue to follow  If discussed at Long Length of Stay Meetings, dates discussed:    Additional Comments:  Dessa Phi, RN 09/19/2016, 12:50 PM

## 2016-09-19 NOTE — Progress Notes (Signed)
CSW consulted to assist with [facility] services. CSW is unable to assist with this request.   CSW signing off.  Werner Lean LCSW [phone]

## 2016-09-20 DIAGNOSIS — E871 Hypo-osmolality and hyponatremia: Secondary | ICD-10-CM

## 2016-09-20 DIAGNOSIS — G893 Neoplasm related pain (acute) (chronic): Secondary | ICD-10-CM

## 2016-09-20 DIAGNOSIS — K8001 Calculus of gallbladder with acute cholecystitis with obstruction: Secondary | ICD-10-CM | POA: Diagnosis not present

## 2016-09-20 DIAGNOSIS — R51 Headache: Secondary | ICD-10-CM | POA: Diagnosis not present

## 2016-09-20 DIAGNOSIS — K831 Obstruction of bile duct: Secondary | ICD-10-CM

## 2016-09-20 LAB — COMPREHENSIVE METABOLIC PANEL
ALK PHOS: 609 U/L — AB (ref 38–126)
ALT: 206 U/L — AB (ref 14–54)
AST: 330 U/L — ABNORMAL HIGH (ref 15–41)
Albumin: 1.9 g/dL — ABNORMAL LOW (ref 3.5–5.0)
Anion gap: 9 (ref 5–15)
BILIRUBIN TOTAL: 21.4 mg/dL — AB (ref 0.3–1.2)
BUN: 13 mg/dL (ref 6–20)
CALCIUM: 8.2 mg/dL — AB (ref 8.9–10.3)
CO2: 25 mmol/L (ref 22–32)
CREATININE: 0.74 mg/dL (ref 0.44–1.00)
Chloride: 86 mmol/L — ABNORMAL LOW (ref 101–111)
Glucose, Bld: 98 mg/dL (ref 65–99)
Potassium: 4.2 mmol/L (ref 3.5–5.1)
SODIUM: 120 mmol/L — AB (ref 135–145)
Total Protein: 4.9 g/dL — ABNORMAL LOW (ref 6.5–8.1)

## 2016-09-20 LAB — CBC WITH DIFFERENTIAL/PLATELET
Basophils Absolute: 0 10*3/uL (ref 0.0–0.1)
Basophils Relative: 0 %
Eosinophils Absolute: 0.1 10*3/uL (ref 0.0–0.7)
Eosinophils Relative: 1 %
HEMATOCRIT: 23.1 % — AB (ref 36.0–46.0)
HEMOGLOBIN: 8.5 g/dL — AB (ref 12.0–15.0)
LYMPHS ABS: 0.5 10*3/uL — AB (ref 0.7–4.0)
LYMPHS PCT: 4 %
MCH: 34.1 pg — AB (ref 26.0–34.0)
MCHC: 36.8 g/dL — ABNORMAL HIGH (ref 30.0–36.0)
MCV: 92.8 fL (ref 78.0–100.0)
Monocytes Absolute: 0.7 10*3/uL (ref 0.1–1.0)
Monocytes Relative: 6 %
NEUTROS ABS: 10.1 10*3/uL — AB (ref 1.7–7.7)
NEUTROS PCT: 89 %
Platelets: 362 10*3/uL (ref 150–400)
RBC: 2.49 MIL/uL — AB (ref 3.87–5.11)
RDW: 17 % — ABNORMAL HIGH (ref 11.5–15.5)
WBC: 11.4 10*3/uL — AB (ref 4.0–10.5)

## 2016-09-20 LAB — PREALBUMIN: Prealbumin: <5 mg/dL — ABNORMAL LOW (ref 18–38)

## 2016-09-20 LAB — TYPE AND SCREEN
ABO/RH(D): AB POS
ANTIBODY SCREEN: NEGATIVE

## 2016-09-20 LAB — ABO/RH: ABO/RH(D): AB POS

## 2016-09-20 MED ORDER — OXYCODONE HCL 20 MG/ML PO CONC: 5.0000 mg | ORAL | Status: DC | PRN

## 2016-09-20 MED ORDER — FAMOTIDINE 20 MG PO TABS
40.0000 mg | ORAL_TABLET | Freq: Two times a day (BID) | ORAL | Status: DC
  Administered 2016-09-21: 40 mg via ORAL
  Filled 2016-09-20 (×4): qty 2

## 2016-09-20 MED ORDER — CALCIUM CARBONATE-VITAMIN D 500-200 MG-UNIT PO TABS
2.0000 | ORAL_TABLET | Freq: Two times a day (BID) | ORAL | Status: DC
  Administered 2016-09-20 – 2016-09-21 (×3): 2 via ORAL
  Filled 2016-09-20 (×3): qty 2

## 2016-09-20 NOTE — Progress Notes (Signed)
[name] [name] is not doing all that well this morning. She had a tough night. She had issues with headaches. Apparently the morphine did not agree with her. She will not take any further morphine. I ordered OxyFast. Maybe she will do okay with OxyFast. She does not seem to do well with narcotic medications.  I talked to her and her husband about a fentanyl patch. She wants to hold off on this.  Her bilirubin is no better. Today it is 21.4. 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 The drainage tube this may not be able to help with her cholestasis.  I am more worried about her sodium. Her sodium continues to drop. Her chloride is quite low. This was suggest a hypovolemic state. I really think that given her terminal nature of disease, that we need to liberalize her intake. I realize that she probably will have some element of edema in her legs because of her hepatic dysfunction. However, I just do not want to see her continue to drop her sodium and then develop neurological issues. I talked to she and her husband about this. I will change her diet to a regular diet.  She is happy with her current pain regimen of Toradol alternating with tramadol.  She's not having any diarrhea.  On her physical exam, her blood pressure is 149/78. Her temperature is 97.3. Pulse is 82. Her lungs sound relatively clear bilaterally. She has some crackles bilaterally. She has good air movement. Cardiac exam regular rate and rhythm. She has a 1/6 systolic ejection murmur. Abdomen is slightly distended. Bowel sounds are slightly decreased. Extremities shows compression stockings. She probably has 1 Hivid 2+ edema. Neurological exam is nonfocal.  I think we should watch her today. I worry about her becoming progressively hyponatremic.  She is okay with staying another day.  I appreciate the fantastic care that she is getting from all the staff up on [address]. It is no surprise that they are doing a  terrific job and showing her a tremendous amount of compassion for [name] [name] and her husband.

## 2016-09-20 NOTE — Progress Notes (Signed)
Pt concerned about her pain medication regimen. Pt said she discussed with Magrinat, MD about trying Roxicodone this evening for breakthrough pain. However, no active order available for this. On call oncologist Ennever, MD made aware. New orders for Roxicodone placed. Pt then became very anxious about whether she should take the Roxicodone or Morphine. This nurse listened to pt's concerns and provided reassurance to the pt. Pt decided to take the morphine. Will continue to monitor for any potential reactions and provide education to the pt.

## 2016-09-20 NOTE — Progress Notes (Signed)
Referring Physician(s): Dr Shelda Pal  Supervising Physician: Daryll Brod  Patient Status:  [facility] - In-pt  Chief Complaint:  Metastatic breast cancer to liver, biliary obstruction  Subjective:  Upsized Biliary drain 6/21 Pt is up in chair Headache from "morphine yesterday" per pt Eating some---with meds Ambulates in room Output of drain is bilious  Allergies: 2nd skin quick heal; Decadron [dexamethasone]; Dilaudid [hydromorphone]; Enoxaparin; Fluconazole; Hydromorphone hcl; Morphine and related; Ondansetron; Protonix [pantoprazole sodium]; and Tegaderm ag mesh [silver]  Medications: Prior to Admission medications   Medication Sig Start Date End Date Taking? Authorizing Provider  alum & mag hydroxide-simeth (MAALOX/MYLANTA) 200-200-20 MG/5ML suspension Take 30 mLs by mouth every 6 (six) hours as needed for indigestion, heartburn or flatulence (Bloating,gas pain). 09/11/16  Yes Magrinat, Virgie Dad, MD  B Complex-C (B-COMPLEX WITH VITAMIN C) tablet Take 1 tablet by mouth daily. Reported on 03/27/2015 01/19/15  Yes Magrinat, Virgie Dad, MD  betamethasone valerate ointment (VALISONE) 0.1 % Apply 1 application topically 2 (two) times daily. 09/15/16  Yes Magrinat, Virgie Dad, MD  calcium-vitamin D (OSCAL WITH D) 500-200 MG-UNIT tablet Take 2 tablets by mouth 2 (two) times daily. 09/11/16  Yes Magrinat, Virgie Dad, MD  cholecalciferol 2000 UNITS tablet Take 1 tablet (2,000 Units total) by mouth daily. 01/19/15  Yes Magrinat, Virgie Dad, MD  docusate sodium (COLACE) 100 MG capsule Take 1 capsule (100 mg total) by mouth 2 (two) times daily. 09/11/16  Yes Magrinat, Virgie Dad, MD  famotidine (PEPCID) 40 MG tablet Take 0.5-1 tablets (20-40 mg total) by mouth 2 (two) times daily. 40 mg in the morning and 20 mg in the evening Patient taking differently: Take 40 mg by mouth 2 (two) times daily.  08/30/16  Yes Gherghe, Vella Redhead, MD  folic acid (FOLVITE) 1 MG tablet Take 1 tablet (1 mg total) by mouth daily.  01/19/15  Yes Magrinat, Virgie Dad, MD  ketorolac (TORADOL) 10 MG tablet Take 1 tablet (10 mg total) by mouth every 6 (six) hours as needed. Patient taking differently: Take 10 mg by mouth every 6 (six) hours.  09/11/16  Yes Magrinat, Virgie Dad, MD  LORazepam (ATIVAN) 0.5 MG tablet Take 1 tablet (0.5 mg total) by mouth every 8 (eight) hours as needed for anxiety. 08/30/16 08/30/17 Yes Gherghe, Vella Redhead, MD  Melatonin 3 MG TABS Take 3 mg by mouth at bedtime.   Yes [provider]  polyethylene glycol (MIRALAX / GLYCOLAX) packet Take 17 g by mouth daily. 09/11/16  Yes Magrinat, Virgie Dad, MD  saccharomyces boulardii (FLORASTOR) 250 MG capsule Take 250 mg by mouth daily.  03/23/15  Yes Magrinat, Virgie Dad, MD  traMADol (ULTRAM) 50 MG tablet Take 1-2 tablets (50-100 mg total) by mouth every 6 (six) hours as needed. Patient taking differently: Take 50-100 mg by mouth every 6 (six) hours.  09/11/16  Yes Magrinat, Virgie Dad, MD  triamterene-hydrochlorothiazide (DYAZIDE) 37.5-25 MG capsule Take 1 each (1 capsule total) by mouth daily as needed (weight gain). 09/15/16  Yes Magrinat, Virgie Dad, MD  Morphine Sulfate (MORPHINE CONCENTRATE) 10 MG/0.5ML SOLN concentrated solution Take 0.5 mLs (10 mg total) by mouth every 2 (two) hours as needed for moderate pain or shortness of breath. Patient not taking: Reported on 09/18/2016 09/11/16   Magrinat, Virgie Dad, MD  promethazine (PHENERGAN) 12.5 MG tablet Take 1 tablet (12.5 mg total) by mouth every 6 (six) hours as needed for nausea. Patient not taking: Reported on 09/18/2016 09/11/16   Magrinat, Virgie Dad, MD  Vital Signs: BP (!) 149/78 (BP Location: Left Leg)   Pulse 82   Temp 97.3 F (36.3 C) (Oral)   Resp 16   Ht 5\' 4"  (1.626 m)   Wt 176 lb (79.8 kg)   SpO2 98%   BMI 30.21 kg/m   Physical Exam  Constitutional: She is oriented to person, place, and time.  Abdominal: Soft. Bowel sounds are normal. There is no tenderness.  Musculoskeletal: Normal range of  motion.  Neurological: She is alert and oriented to person, place, and time.  Skin: Skin is warm and dry.  Skin site is clean and dry NT no bleeding OP is bilious: 225 cc yesterday; 50 cc in bag Afeb VSS   Psychiatric: She has a normal mood and affect. Her behavior is normal.  Nursing note and vitals reviewed.  Cx from drainage pending  Imaging: Ir Exchange Biliary Drain  Result Date: 09/18/2016 INDICATION: Metastatic breast carcinoma with biliary obstruction, status post internal/external drain catheter placement 09/06/2016. There was an initial improvement in the patient's bilirubin, which has since elevated above preprocedure level. EXAM: CHOLANGIOGRAM THROUGH EXISTING CATHETER EXCHANGE AND REVISION OF INTERNAL-EXTERNAL BILIARY DRAIN MEDICATIONS: Zosyn 3.375 g IV; The antibiotic was administered within an appropriate time frame prior to the initiation of the procedure. ANESTHESIA/SEDATION: Intravenous Fentanyl and Versed were administered as conscious sedation during continuous monitoring of the patient's level of consciousness and physiological / cardiorespiratory status by the radiology RN, with a total moderate sedation time of 15 minutes. PROCEDURE: Informed written consent was obtained from the patient after a thorough discussion of the procedural risks, benefits and alternatives. All questions were addressed. Maximal Sterile Barrier Technique was utilized including caps, mask, sterile gowns, sterile gloves, sterile drape, hand hygiene and skin antiseptic. A timeout was performed prior to the initiation of the procedure. Lidocaine 1% was administered subcutaneously around the catheter. Cholangiogram through the existing internal -external biliary drain catheter was performed. The catheter was cut and exchanged over a Bentson wire for a 5 [place] Kumpe catheter, advanced into the distal duodenum. This was then exchanged over an Amplatz wire for serial vascular dilators which facilitated  advancement of a 12 [place] internal external biliary drain catheter, placed with side hole spanning the central left bile duct through the CBD into the duodenum. Catheter injection confirms appropriate positioning. The patient tolerated the procedure well. FLUOROSCOPY TIME:  2.2 minutes, 850  uGym2 DAP COMPLICATIONS: None immediate. FINDINGS: The initial cholangiogram demonstrated stable position of the previously placed 8 [place] internal external drain catheter with additional sideholes. The left biliary tree was decompressed. The posterior right biliary tree was incompletely opacified but appear decompressed. The anterior right biliary tree was dilated centrally with focal high-grade stenosis near the confluence with the left duct. The catheter was exchanged and upsized to a 12 [place] internal -external drain catheter as above, without complication. IMPRESSION: 1. Continued patency of the previously placed biliary drain catheter with decompression of the left biliary system and a portion of the posterior right biliary tree. 2. Technically successful exchange and up-sizing to a 12 [place] internal external device, placed to external drainage. Electronically Signed   By: Lucrezia Europe M.D.   On: 09/18/2016 16:55    Labs:  CBC:  Recent Labs  09/11/16 0843 09/15/16 1131 09/18/16 1316 09/19/16 0323  WBC 17.8* 8.4 7.8 17.5*  HGB 9.0* 8.9* 8.7* 8.3*  HCT 25.2* 25.2* 23.6* 22.8*  PLT 396 428* 411* 367    COAGS:  Recent Labs  08/28/16 0721 08/29/16  7897 09/05/16 1148 09/18/16 1316  INR 1.07 1.11 1.30* 1.34    BMP:  Recent Labs  09/10/16 0506 09/11/16 0843 09/15/16 1131 09/18/16 1316 09/19/16 0323  NA 127* 126* 123* 122* 124*  K 3.6 3.4* 4.7 4.3 4.6  CL 97* 96*  --  87* 89*  CO2 22 22 25 26 25   GLUCOSE 84 91 87 91 97  BUN 12 11 11.9 12 13   CALCIUM 6.6* 7.4* 9.3 8.3* 8.2*  CREATININE 0.37* 0.47 0.7 0.62 0.73  GFRNONAA >60 >60  --  >60 >60  GFRAA >60 >60  --  >60 >60    LIVER  FUNCTION TESTS:  Recent Labs  09/11/16 0843 09/15/16 1131 09/18/16 1316 09/19/16 0323  BILITOT 14.8* 18.65* 20.9* 20.3*  AST 187* 273* 268* 291*  ALT 222* 249* 206* 200*  ALKPHOS 331* 560* 581* 551*  PROT 4.8* 4.8* 5.1* 4.8*  ALBUMIN 1.9* 1.9* 2.2* 1.9*    Assessment and Plan:  Metastatic breast cancer to liver; biliary obstruction Drain in place Will follow Plan per Oncology  Electronically Signed: Virgle Arth A, PA-C 09/20/2016, 9:35 AM   I spent a total of 15 Minutes at the the patient's bedside AND on the patient's hospital floor or unit, greater than 50% of which was counseling/coordinating care for biliary drain

## 2016-09-20 NOTE — Progress Notes (Signed)
CRITICAL VALUE ALERT  Critical Value:  Bilirubin 21.4  Date & Time Notied:  09/20/2016 1030  Provider Notified: Vernard Gambles  Orders Received/Actions taken:

## 2016-09-21 DIAGNOSIS — K8001 Calculus of gallbladder with acute cholecystitis with obstruction: Secondary | ICD-10-CM | POA: Diagnosis not present

## 2016-09-21 LAB — COMPREHENSIVE METABOLIC PANEL
ALK PHOS: 643 U/L — AB (ref 38–126)
ALT: 188 U/L — ABNORMAL HIGH (ref 14–54)
ANION GAP: 9 (ref 5–15)
AST: 294 U/L — ABNORMAL HIGH (ref 15–41)
Albumin: 2 g/dL — ABNORMAL LOW (ref 3.5–5.0)
BUN: 13 mg/dL (ref 6–20)
CHLORIDE: 88 mmol/L — AB (ref 101–111)
CO2: 25 mmol/L (ref 22–32)
Calcium: 8.2 mg/dL — ABNORMAL LOW (ref 8.9–10.3)
Creatinine, Ser: 0.59 mg/dL (ref 0.44–1.00)
GFR calc non Af Amer: >60 mL/min (ref >60)
GLUCOSE: 102 mg/dL — AB (ref 65–99)
POTASSIUM: 4.2 mmol/L (ref 3.5–5.1)
SODIUM: 122 mmol/L — AB (ref 135–145)
Total Bilirubin: 21.9 mg/dL (ref 0.3–1.2)
Total Protein: 4.8 g/dL — ABNORMAL LOW (ref 6.5–8.1)

## 2016-09-21 LAB — AMMONIA: Ammonia: 89 umol/L — ABNORMAL HIGH (ref 9–35)

## 2016-09-21 LAB — CBC WITH DIFFERENTIAL/PLATELET
BASOS PCT: 0 %
Basophils Absolute: 0 10*3/uL (ref 0.0–0.1)
EOS ABS: 0.2 10*3/uL (ref 0.0–0.7)
EOS PCT: 2 %
HCT: 22.3 % — ABNORMAL LOW (ref 36.0–46.0)
HEMOGLOBIN: 8.3 g/dL — AB (ref 12.0–15.0)
Lymphocytes Relative: 3 %
Lymphs Abs: 0.3 10*3/uL — ABNORMAL LOW (ref 0.7–4.0)
MCH: 34.3 pg — ABNORMAL HIGH (ref 26.0–34.0)
MCHC: 37.2 g/dL — ABNORMAL HIGH (ref 30.0–36.0)
MCV: 92.1 fL (ref 78.0–100.0)
MONOS PCT: 9 %
Monocytes Absolute: 0.8 10*3/uL (ref 0.1–1.0)
NEUTROS PCT: 86 %
Neutro Abs: 7.8 10*3/uL — ABNORMAL HIGH (ref 1.7–7.7)
PLATELETS: 354 10*3/uL (ref 150–400)
RBC: 2.42 MIL/uL — ABNORMAL LOW (ref 3.87–5.11)
RDW: 17 % — ABNORMAL HIGH (ref 11.5–15.5)
WBC: 9.1 10*3/uL (ref 4.0–10.5)

## 2016-09-21 MED ORDER — FOLIC ACID 1 MG PO TABS: 1.0000 mg | ORAL_TABLET | Freq: Every day | ORAL | 2 refills | Status: AC

## 2016-09-21 MED ORDER — FAMOTIDINE 40 MG PO TABS: 40.0000 mg | ORAL_TABLET | Freq: Two times a day (BID) | ORAL | 3 refills | Status: AC

## 2016-09-21 MED ORDER — LEVOFLOXACIN 500 MG PO TABS: 500.0000 mg | ORAL_TABLET | Freq: Every day | ORAL | Status: DC

## 2016-09-21 MED ORDER — LEVOFLOXACIN 500 MG PO TABS: 500.0000 mg | ORAL_TABLET | Freq: Every day | ORAL | 0 refills | Status: DC

## 2016-09-21 NOTE — Care Management Note (Signed)
Case Management Note  Patient Details  Name: [name] [name] MRN: [id number] Date of Birth: 06-Jun-1960  Subjective/Objective:   Please see previous NCM notes                 Action/Plan: Discharge Planning: NCM spoke to pt at bedside. Offered choice for HH/list provided. States she is active with [facility]. Pt had concerns about [facility] HH RN and times RN came for visits. And [facility] nursing technique when caring for PICC and drain. States she will contact [place] in am to arrange appt times. NCM contacted [facility] Liaison with pt's concerns. States he will notify [facility] nursing team.   PCP Chauncey Cruel MD   Expected Discharge Date:  09/18/16               Expected Discharge Plan:  [place]  In-House Referral:  NA  Discharge planning Services  CM Consult  Post Acute Care Choice:  Home Health, Resumption of Svcs/PTA Provider [facility] active-HHRN-picc flush, drain care/dsg changes) Choice offered to:  Patient  DME Arranged:  N/A DME Agency:  NA  HH Arranged:  RN [place] Agency:  [place]  Status of Service:  Completed, signed off  If discussed at [place] of Stay Meetings, dates discussed:    Additional Comments:  Erenest Rasher, RN 09/21/2016, 12:15 PM

## 2016-09-21 NOTE — Discharge Summary (Signed)
The discaharge note is to be done by Dr. Jana Hakim.  [name], MD

## 2016-09-22 ENCOUNTER — Encounter: Payer: Self-pay | Admitting: Oncology

## 2016-09-22 ENCOUNTER — Other Ambulatory Visit: Payer: 59

## 2016-09-22 ENCOUNTER — Other Ambulatory Visit: Payer: Self-pay | Admitting: *Deleted

## 2016-09-22 ENCOUNTER — Ambulatory Visit: Payer: 59 | Admitting: Physical Therapy

## 2016-09-22 ENCOUNTER — Other Ambulatory Visit: Payer: Self-pay | Admitting: Oncology

## 2016-09-22 DIAGNOSIS — M62838 Other muscle spasm: Secondary | ICD-10-CM

## 2016-09-22 DIAGNOSIS — E440 Moderate protein-calorie malnutrition: Secondary | ICD-10-CM | POA: Insufficient documentation

## 2016-09-22 DIAGNOSIS — I890 Lymphedema, not elsewhere classified: Secondary | ICD-10-CM | POA: Diagnosis not present

## 2016-09-22 LAB — BODY FLUID CULTURE

## 2016-09-22 NOTE — Therapy (Signed)
[place], [place], [address] Phone: [phone]   Fax:  [phone]  Physical Therapy Treatment  Patient Details  Name: [name] [name] MRN: [id number] Date of Birth: 08-26-1960 Referring Provider: Dr. Kyung Rudd  Encounter Date: 09/22/2016      PT End of Session - 09/22/16 1238    Visit Number 10  45 for lymph   Number of Visits 74  for lymphedema   Date for PT Re-Evaluation 12/08/16   Authorization - Number of Visits 140   PT Start Time 1024   PT Stop Time 1108   PT Time Calculation (min) 44 min   Activity Tolerance Patient tolerated treatment well   Behavior During Therapy [facility] for tasks assessed/performed      Past Medical History:  Diagnosis Date  . Bone metastases ([place]) dx'd 05/2014  . Breast cancer ([place]) dx'd 2005/2011  . Peripheral vascular disease ([place]) 02/2010   blood clot related to porta cath  . PONV (postoperative nausea and vomiting)   . S/P radiation therapy 07/17/2014 through 08/02/2014    Left mediastinum, left seventh rib 3250 cGy in 13 sessions   . S/P radiation therapy 12/11/2014 through 12/22/2014    Left parietal calvarium 2400 cGy in 8 sessions   . Seizures ([place]) 2010   Isolated incident.    Past Surgical History:  Procedure Laterality Date  . AXILLARY LYMPH NODE DISSECTION  Dec. 2011  . BREAST LUMPECTOMY  2005  . IR EXCHANGE BILIARY DRAIN  09/18/2016  . IR FLUORO GUIDE CV LINE LEFT  09/05/2016  . IR GENERIC HISTORICAL  05/21/2016   IR RADIOLOGIST EVAL & MGMT 05/21/2016 Sandi Mariscal, MD [facility] INTERV RAD  . IR INT EXT BILIARY DRAIN WITH CHOLANGIOGRAM  09/06/2016  . IR US GUIDE VASC ACCESS LEFT  09/05/2016  . MEDIASTINOTOMY CHAMBERLAIN MCNEIL Left 06/02/2013   Procedure: MEDIASTINOTOMY CHAMBERLAIN MCNEIL;  Surgeon: [name], MD;  Location: [facility];  Service: Thoracic;  Laterality: Left;  LEFT ANTERIOR MEDIASTINOTOMY   . PORTACATH PLACEMENT  12/11  . removal portacath      There were no vitals filed for this visit.      Subjective Assessment - 09/22/16 1235    Subjective Was hospitalized for several days; went home yesterday.  Pain is under control, but still having issues with setting up home health services.   Currently in Pain? Other (Comment)  says pain is under control with current meds                         [facility] Adult PT Treatment/Exercise - 09/22/16 0001      Manual Therapy   Manual Lymphatic Drainage (MLD) In supine (with head of bed elevated and legs elevated), short neck and anterior interaxillary anastomosis, right upper outer breast directing toward this anastomosis, and right UE from fingers to shoulder.  Also, each leg from hip to ankle, then retracing pathways up to groin on same side. In sitting, posterior interaxillary anastomosis right to left.   Other Manual Therapy In sitting soft tissue work to neck, upper and right mid-back.                  PT Short Term Goals - 02/12/16 1226      PT SHORT TERM GOAL #1   Title pain with walking decreased >/= 25%   Time 4   Period Weeks   Status Achieved  PT Long Term Goals - 08/05/16 1318      PT LONG TERM GOAL #1   Title indpendent with HEP   Time 8   Period Weeks   Status On-going     PT LONG TERM GOAL #2   Title pain with walking decreased >/= 75%   Time 8   Period Weeks   Status On-going     PT LONG TERM GOAL #3   Title ability to flex her right hip with right groin pain decreased >/= 50% due to improved tissue mobility   Time 8   Period Weeks   Status Achieved     PT LONG TERM GOAL #4   Title waking up in middle of night with pain decreased >/=50%   Time 8   Period Weeks   Status Achieved     PT LONG TERM GOAL #5   Title reduction of pain by end of day >/= 50% due  to increase tissue mobility   Time 8   Period Weeks   Status On-going           Long Term Clinic Goals - 09/15/16 1543      CC Long Term Goal  #2   Title Pt. will report swelling is adequately managed to enable ADL function at a consistent level.   Baseline This refers to her right breast/axilla/UE lymphedema and not the other swelling that is current.   Status On-going     CC Long Term Goal  #4   Title Pain/discomfort at right axilla area will be controlled at 6/10 or less.   Status On-going     CC Long Term Goal  #5   Title Patient will avoid infection by ongoing management of her lymphedema at right breast/axilla/upper arm areas.   Status On-going            Plan - 09/22/16 1238    Clinical Impression Statement Patient still appears very jaundiced and has the collection back from tube into her liver.  She has heavier weight compression stockings, although she plans to replace the current size large with size medium, that appear to be doing better for her legs, including that they don't roll down at the top because they have a silicone band.    Rehab Potential Good   Clinical Impairments Affecting Rehab Potential active cancer   PT Frequency 2x / week   PT Duration 12 weeks   PT Treatment/Interventions ADLs/Self Care Home Management;Therapeutic activities;Therapeutic exercise;Patient/family education;Manual techniques;Manual lymph drainage;Scar mobilization   PT Next Visit Plan manual lymph drainage, soft tissue, and myofascial release   Consulted and Agree with Plan of Care Patient      Patient will benefit from skilled therapeutic intervention in order to improve the following deficits and impairments:  Increased edema, Increased fascial restricitons, Pain  Visit Diagnosis: Lymphedema, not elsewhere classified  Other muscle spasm     Problem List Patient Active Problem List   Diagnosis Date Noted  . Malnutrition of moderate degree ([place]) 09/22/2016  .  Rigors 09/18/2016  . Cholestasis, intrahepatic 09/05/2016  . Cholelithiasis   . Jaundice   . RUQ pain 08/26/2016  . Liver metastases ([name]) 02/23/2016  . 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 Zoster 04/04/2015  . Nausea with vomiting 11/18/2014  . Constipation 11/18/2014  . Left-sided thoracic back pain   . Bone metastases ([place]) 11/16/2014  . Back pain 11/15/2014  . Uncontrolled pain 11/14/2014  . Post-lymphadenectomy lymphedema of arm 05/31/2014  . Malignant  neoplasm of upper-outer quadrant of right female breast ([place]) 05/02/2014  . Chest wall pain 03/21/2014  . Abnormal LFTs (liver function tests) 09/12/2013  . Malignant neoplasm of female breast ([place]) 08/18/2013  . 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

## 2016-09-22 NOTE — Progress Notes (Unsigned)
I spoke with [name] [name] and [name] today. They're having some difficulty with the home health care group out of [place]. They're looking for different 1. They're having some insurance issues because her situation has changed of course and she is now under palliative care not treatment. They would like a letter to that effect which I will be glad to right.

## 2016-09-23 ENCOUNTER — Ambulatory Visit (HOSPITAL_BASED_OUTPATIENT_CLINIC_OR_DEPARTMENT_OTHER): Payer: 59 | Admitting: Oncology

## 2016-09-23 ENCOUNTER — Telehealth: Payer: Self-pay | Admitting: *Deleted

## 2016-09-23 ENCOUNTER — Other Ambulatory Visit (HOSPITAL_BASED_OUTPATIENT_CLINIC_OR_DEPARTMENT_OTHER): Payer: 59

## 2016-09-23 ENCOUNTER — Ambulatory Visit (HOSPITAL_BASED_OUTPATIENT_CLINIC_OR_DEPARTMENT_OTHER): Payer: 59

## 2016-09-23 DIAGNOSIS — C50211 Malignant neoplasm of upper-inner quadrant of right female breast: Secondary | ICD-10-CM

## 2016-09-23 DIAGNOSIS — Z170 Estrogen receptor positive status [ER+]: Secondary | ICD-10-CM | POA: Diagnosis not present

## 2016-09-23 DIAGNOSIS — Z923 Personal history of irradiation: Secondary | ICD-10-CM | POA: Diagnosis not present

## 2016-09-23 DIAGNOSIS — C787 Secondary malignant neoplasm of liver and intrahepatic bile duct: Secondary | ICD-10-CM

## 2016-09-23 DIAGNOSIS — C771 Secondary and unspecified malignant neoplasm of intrathoracic lymph nodes: Secondary | ICD-10-CM | POA: Diagnosis not present

## 2016-09-23 DIAGNOSIS — Z86718 Personal history of other venous thrombosis and embolism: Secondary | ICD-10-CM

## 2016-09-23 DIAGNOSIS — C50411 Malignant neoplasm of upper-outer quadrant of right female breast: Secondary | ICD-10-CM

## 2016-09-23 DIAGNOSIS — I890 Lymphedema, not elsewhere classified: Secondary | ICD-10-CM | POA: Diagnosis not present

## 2016-09-23 DIAGNOSIS — M8580 Other specified disorders of bone density and structure, unspecified site: Secondary | ICD-10-CM

## 2016-09-23 DIAGNOSIS — C7951 Secondary malignant neoplasm of bone: Secondary | ICD-10-CM

## 2016-09-23 DIAGNOSIS — G893 Neoplasm related pain (acute) (chronic): Secondary | ICD-10-CM | POA: Diagnosis not present

## 2016-09-23 DIAGNOSIS — C50011 Malignant neoplasm of nipple and areola, right female breast: Secondary | ICD-10-CM

## 2016-09-23 LAB — CBC WITH DIFFERENTIAL/PLATELET
BASO%: 0.2 % (ref 0.0–2.0)
Basophils Absolute: 0 10*3/uL (ref 0.0–0.1)
EOS%: 0.1 % (ref 0.0–7.0)
Eosinophils Absolute: 0 10*3/uL (ref 0.0–0.5)
HCT: 23.2 % — ABNORMAL LOW (ref 34.8–46.6)
HEMOGLOBIN: 8.3 g/dL — AB (ref 11.6–15.9)
LYMPH#: 0.7 10*3/uL — AB (ref 0.9–3.3)
LYMPH%: 3.7 % — ABNORMAL LOW (ref 14.0–49.7)
MCH: 34.2 pg — ABNORMAL HIGH (ref 25.1–34.0)
MCHC: 35.8 g/dL (ref 31.5–36.0)
MCV: 95.5 fL (ref 79.5–101.0)
MONO#: 1.2 10*3/uL — ABNORMAL HIGH (ref 0.1–0.9)
MONO%: 6.8 % (ref 0.0–14.0)
NEUT%: 89.2 % — ABNORMAL HIGH (ref 38.4–76.8)
NEUTROS ABS: 16 10*3/uL — AB (ref 1.5–6.5)
NRBC: 0 % (ref 0–0)
Platelets: 377 10*3/uL (ref 145–400)
RBC: 2.43 10*6/uL — AB (ref 3.70–5.45)
RDW: 16.5 % — AB (ref 11.2–14.5)
WBC: 17.9 10*3/uL — AB (ref 3.9–10.3)

## 2016-09-23 LAB — COMPREHENSIVE METABOLIC PANEL
ALT: 150 U/L — AB (ref 0–55)
AST: 200 U/L — AB (ref 5–34)
Albumin: 1.6 g/dL — ABNORMAL LOW (ref 3.5–5.0)
Alkaline Phosphatase: 699 U/L — ABNORMAL HIGH (ref 40–150)
Anion Gap: 10 mEq/L (ref 3–11)
BUN: 13.5 mg/dL (ref 7.0–26.0)
CO2: 23 meq/L (ref 22–29)
CREATININE: 0.8 mg/dL (ref 0.6–1.1)
Calcium: 8 mg/dL — ABNORMAL LOW (ref 8.4–10.4)
Chloride: 89 mEq/L — ABNORMAL LOW (ref 98–109)
EGFR: 80 mL/min/{1.73_m2} — ABNORMAL LOW (ref >90)
GLUCOSE: 93 mg/dL (ref 70–140)
Potassium: 3.9 mEq/L (ref 3.5–5.1)
SODIUM: 122 meq/L — AB (ref 136–145)
TOTAL PROTEIN: 4.4 g/dL — AB (ref 6.4–8.3)

## 2016-09-23 MED ORDER — FENTANYL CITRATE 200 MCG BU LPOP: 200.0000 ug | BUCCAL | 0 refills | Status: AC | PRN

## 2016-09-23 MED ORDER — HEPARIN SOD (PORK) LOCK FLUSH 100 UNIT/ML IV SOLN
250.0000 [IU] | Freq: Once | INTRAVENOUS | Status: AC | PRN
  Administered 2016-09-23: 12:00:00
  Filled 2016-09-23: qty 5

## 2016-09-23 MED ORDER — SODIUM CHLORIDE 0.9 % IJ SOLN
10.0000 mL | INTRAMUSCULAR | Status: DC | PRN
  Administered 2016-09-23: 10 mL
  Filled 2016-09-23: qty 10

## 2016-09-23 NOTE — Telephone Encounter (Signed)
This RN contacted [facility] per request of pt and husband due to concerns they feel have been misunderstood regarding goal of home health " they keep discussing teaching [place] to be independent "  Per discussion at MD visit today - Jozey states per her insurance coordinator her benefits allow for 4 skilled nursing visits. Giannamarie would like to use these visits for actual skilled care for flushing of PICC and bilary tube as well as dressing changes.  If care would extend beyond the 90 visits - Nisreen understands " we would pay out of pocket and decide at that time "  Pt and husband will be meeting with [facility] later this week to discuss possible admission for services.  At present- Ameliya understands seriousness of situation  - states thankfulness for care as well as " God is good and we will see "  [name] and [name] asked that this RN contact [facility] to verify expectations with request to call [name] later today to verify plans and days of services.  This RN contacted [facility] / [place] team at extension [phone] and spoke with [facility]. Above discussed including request to omit teaching perspective of care if possible- note if required by insurance " teach family " - request is to discuss with [name] only and not in presence of patient.  This RN reiterated as discussed yesterday goal of care per this office is to support patient at this time with out causing increased anxieties.  Lucky verbalized understanding and she will contact [name] later today to verify home care concerns and plans.

## 2016-09-23 NOTE — Progress Notes (Signed)
[place]  Telephone:([phone] Fax:(336) [phone]     ID: [name] [name] OB: 15-Apr-1960  MR#: [id number]  FXT#:[id number]  PCP: [name] Cruel, MD GYN:  [name] [name] SU:  OTHER MD: [name] [name], [name] [name], [name] [name], [name] [name], [name] [name]   CHIEF COMPLAINT: Stage IV breast cancer  CURRENT TREATMENT: Palliative care with hospice in place  INTERVAL HISTORY:   [name] [name] returns today after her discharge 2 days ago. The replaced biliary drainage initially was doing poorly but now is draining about 150 mL a day. She tolerates it well, has no pain related to that. She is here today to discuss issues relating to pain and diet primarily. Also we are trying to make sure that they have the appropriate support at home.  REVIEW OF SYiSTEMS: [name] [name] tells me her pain is generally well controlled on tramadol and Toradol. She was unable to tolerate morphine at the hospital. She said she vomited for about 8 hours. She is managing to have fairly regular bowel movements which are not hard. She is not having any bleeding complications. She denies fever. He detailed review of systems today was otherwise unchanged as compared to 5 days ago  BREAST CANCER HISTORY: From doctor [name] [name]'s intake note 03/20/2004:  "The patient is a very pleasant 56 year old female, without significant past medical history.  Her family history is significant for a sister who at age 80 was diagnosed with invasive ductal carcinoma.  She is a breast cancer survivor at age 27 now.  The patient states that she has never really had a screening mammogram until October 2005, when she felt that it was time for her to start having mammograms done on a yearly basis.  Therefore, on 01/26/04, she underwent a screening mammogram and an abnormality was detected in the upper outer right breast.  She, therefore, underwent spot compression views of both the right and the left  breast.  The left breast revealed a well-defined mass in the upper outer left quadrant, present at the 2 o'clock position, measuring 1.8 cm, 6 cm from the nipple.  This, by ultrasound, was felt to be a simple cyst measuring 1.8 cm.  On the right breast, a spiculated mass was noted in the upper outer right quadrant.  The ultrasound revealed a shadowing irregular solid mass at the 10:30 position, 9 cm from the nipple, measuring 1.2 cm in greatest dimension, correlating with the spiculated mass seen on the mammogram.  The right axilla was negative ultrasonically.  Because of this, the patient underwent a needle biopsy of the right breast and the biopsy was positive invasive mammary carcinoma that showed features consistent with a high-grade invasive ductal carcinoma associated with desmoplastic stroma.  No in situ component was seen and no definite lymphovascular invasion was identified.  On the core biopsy, the tumor measured about 0.8 cm.  Because of this, she was seen by [name] [name] and the patient was taken to the [facility] on March 15, 2004.  She underwent a right breast lumpectomy with sentinel node biopsy.  The final pathology revealed an invasive ductal carcinoma, measuring 1.7 cm, grade 2 of 3.  Margins were free of tumor.  Atypical lobular hyperplasia was noted.  One sentinel node was removed which was negative for metastatic disease.  The tumor was staged at T1c, N0 MX.  It was estrogen receptor positive, progesterone receptor positive.  HER-2/neu was 2+.  FISH was negative.  All margins were free  of tumor.  She is now seen in Medical Oncology for further evaluation and management of this newly diagnosed T1c, node negative, stage I, invasive ductal carcinoma of the right breast."  Her subsequent history is as detailed below   PAST MEDICAL HISTORY: Past Medical History:  Diagnosis Date  . Bone metastases ([place]) dx'd 05/2014  . Breast cancer ([place]) dx'd 2005/2011  . Peripheral vascular disease  ([place]) 02/2010   blood clot related to porta cath  . PONV (postoperative nausea and vomiting)   . S/P radiation therapy 07/17/2014 through 08/02/2014    Left mediastinum, left seventh rib 3250 cGy in 13 sessions   . S/P radiation therapy 12/11/2014 through 12/22/2014    Left parietal calvarium 2400 cGy in 8 sessions   . Seizures ([place]) 2010   Isolated incident.    PAST SURGICAL HISTORY: Past Surgical History:  Procedure Laterality Date  . AXILLARY LYMPH NODE DISSECTION  Dec. 2011  . BREAST LUMPECTOMY  2005  . IR EXCHANGE BILIARY DRAIN  09/18/2016  . IR FLUORO GUIDE CV LINE LEFT  09/05/2016  . IR GENERIC HISTORICAL  05/21/2016   IR RADIOLOGIST EVAL & MGMT 05/21/2016 [name] Mariscal, MD [facility] INTERV RAD  . IR INT EXT BILIARY DRAIN WITH CHOLANGIOGRAM  09/06/2016  . IR US GUIDE VASC ACCESS LEFT  09/05/2016  . MEDIASTINOTOMY CHAMBERLAIN MCNEIL Left 06/02/2013   Procedure: MEDIASTINOTOMY CHAMBERLAIN MCNEIL;  Surgeon: [name] Nakayama, MD;  Location: [facility];  Service: Thoracic;  Laterality: Left;  LEFT ANTERIOR MEDIASTINOTOMY   . PORTACATH PLACEMENT  12/11  . removal portacath      FAMILY HISTORY Family History  Problem Relation Age of Onset  . COPD Mother   . Breast cancer Sister 74   The patient's father is living, 41 years old as of may 2015. He lives in [place]. The patient's mother died from complications of COPD at the age of 73. These has 2 brothers, one sister. Her sister developed breast cancer at the age of 22. She is doing well. The patient herself underwent genetic testing at [facility] in 2011 and was found to be BRCA negative  GYNECOLOGIC HISTORY:  Menarche age 33, she is GX P0. She stopped having periods with her initial chemotherapy in 2006.  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 Her husband [name] [name] is a Medical illustrator in [place]. He has a child from a prior marriage. At home they have 2 rescue dogs, [name] [name] and [name] [name]. The patient is religious but not a church attender    ADVANCED DIRECTIVES: In place; at the 08/04/2014 visit in particular the patient was very clear, with her husband present, that she would not want any kind of feeding tubes or "other tubes" if her condition deteriorated.   HEALTH MAINTENANCE: Social History  Substance Use Topics  . Smoking status: Never Smoker  . Smokeless tobacco: Never Used  . Alcohol use No     Colonoscopy:  PAP:  Bone density: March 2015; mild osteopenia  Lipid panel:  Allergies  Allergen Reactions  . 2nd Skin Quick Heal Other (See Comments)    Other Reaction: Skin peels  . Decadron [Dexamethasone] Other (See Comments)    Patient does not tolerate steroids.   . Dilaudid [Hydromorphone] Nausea And Vomiting  . Enoxaparin Other (See Comments)    unknown  . Fluconazole Swelling    Liver toxicity  . Hydromorphone Hcl Nausea And Vomiting  .  Morphine And Related Nausea And Vomiting  . Ondansetron Other (See Comments)    "makes me loopy"  . Protonix [Pantoprazole Sodium] Other (See Comments)    Patient reports it caused thrush.  . Tegaderm Ag Mesh [Silver]     Current Outpatient Prescriptions  Medication Sig Dispense Refill  . alum & mag hydroxide-simeth (MAALOX/MYLANTA) 200-200-20 MG/5ML suspension Take 30 mLs by mouth every 6 (six) hours as needed for indigestion, heartburn or flatulence (Bloating,gas pain). 355 mL 0  . B Complex-C (B-COMPLEX WITH VITAMIN C) tablet Take 1 tablet by mouth daily. Reported on 03/27/2015    . betamethasone valerate ointment (VALISONE) 0.1 % Apply 1 application topically 2 (two) times daily. 30 g 0  . calcium-vitamin D (OSCAL WITH D) 500-200 MG-UNIT tablet Take 2 tablets by mouth 2 (two) times daily. 120 tablet 6  . cholecalciferol 2000 UNITS tablet Take 1 tablet  (2,000 Units total) by mouth daily.    Marland Kitchen docusate sodium (COLACE) 100 MG capsule Take 1 capsule (100 mg total) by mouth 2 (two) times daily. 10 capsule 0  . famotidine (PEPCID) 40 MG tablet Take 0.5-1 tablets (20-40 mg total) by mouth 2 (two) times daily. 40 mg in the morning and 20 mg in the evening (Patient taking differently: Take 40 mg by mouth 2 (two) times daily. ) 30 tablet 1  . famotidine (PEPCID) 40 MG tablet Take 1 tablet (40 mg total) by mouth 2 (two) times daily. 60 tablet 3  . folic acid (FOLVITE) 1 MG tablet Take 1 tablet (1 mg total) by mouth daily.    . folic acid (FOLVITE) 1 MG tablet Take 1 tablet (1 mg total) by mouth daily. 90 tablet 2  . ketorolac (TORADOL) 10 MG tablet Take 1 tablet (10 mg total) by mouth every 6 (six) hours as needed. (Patient taking differently: Take 10 mg by mouth every 6 (six) hours. ) 120 tablet 6  . levofloxacin (LEVAQUIN) 500 MG tablet Take 1 tablet (500 mg total) by mouth daily. 14 tablet 0  . LORazepam (ATIVAN) 0.5 MG tablet Take 1 tablet (0.5 mg total) by mouth every 8 (eight) hours as needed for anxiety. 30 tablet 0  . Melatonin 3 MG TABS Take 3 mg by mouth at bedtime.    . Morphine Sulfate (MORPHINE CONCENTRATE) 10 MG/0.5ML SOLN concentrated solution Take 0.5 mLs (10 mg total) by mouth every 2 (two) hours as needed for moderate pain or shortness of breath. (Patient not taking: Reported on 09/18/2016) 180 mL 0  . polyethylene glycol (MIRALAX / GLYCOLAX) packet Take 17 g by mouth daily. 14 each 0  . promethazine (PHENERGAN) 12.5 MG tablet Take 1 tablet (12.5 mg total) by mouth every 6 (six) hours as needed for nausea. (Patient not taking: Reported on 09/18/2016) 30 tablet 0  . saccharomyces boulardii (FLORASTOR) 250 MG capsule Take 250 mg by mouth daily.     . traMADol (ULTRAM) 50 MG tablet Take 1-2 tablets (50-100 mg total) by mouth every 6 (six) hours as needed. (Patient taking differently: Take 50-100 mg by mouth every 6 (six) hours. ) 120 tablet 0  .  triamterene-hydrochlorothiazide (DYAZIDE) 37.5-25 MG capsule Take 1 each (1 capsule total) by mouth daily as needed (weight gain). 30 capsule 6   No current facility-administered medications for this visit.    Facility-Administered Medications Ordered in Other Visits  Medication Dose Route Frequency Provider Last Rate Last Dose  . sodium chloride 0.9 % injection 10 mL  10 mL Intracatheter PRN Magrinat,  Virgie Dad, MD   10 mL at 09/23/16 1219    OBJECTIVE: Middle-aged white woman Examined in a wheelchair  There were no vitals filed for this visit.   There is no height or weight on file to calculate BMI.   Patient refused vitals 09/23/16    ECOG FS: 2  LAB RESULTS:   CMP     Component Value Date/Time   NA 122 (L) 09/21/2016 0414   NA 123 (L) 09/15/2016 1131   K 4.2 09/21/2016 0414   K 4.7 09/15/2016 1131   CL 88 (L) 09/21/2016 0414   CL 105 05/06/2012 1333   CO2 25 09/21/2016 0414   CO2 25 09/15/2016 1131   GLUCOSE 102 (H) 09/21/2016 0414   GLUCOSE 87 09/15/2016 1131   GLUCOSE 124 (H) 05/06/2012 1333   BUN 13 09/21/2016 0414   BUN 11.9 09/15/2016 1131   CREATININE 0.59 09/21/2016 0414   CREATININE 0.7 09/15/2016 1131   CALCIUM 8.2 (L) 09/21/2016 0414   CALCIUM 9.3 09/15/2016 1131   PROT 4.8 (L) 09/21/2016 0414   PROT 4.8 (L) 09/15/2016 1131   ALBUMIN 2.0 (L) 09/21/2016 0414   ALBUMIN 1.9 (L) 09/15/2016 1131   AST 294 (H) 09/21/2016 0414   AST 273 (HH) 09/15/2016 1131   ALT 188 (H) 09/21/2016 0414   ALT 249 (H) 09/15/2016 1131   ALKPHOS 643 (H) 09/21/2016 0414   ALKPHOS 560 (H) 09/15/2016 1131   BILITOT 21.9 (HH) 09/21/2016 0414   BILITOT 18.65 (HH) 09/15/2016 1131   GFRNONAA >60 09/21/2016 0414   GFRAA >60 09/21/2016 0414    No results found for: SPEP  Lab Results  Component Value Date   WBC 17.9 (H) 09/23/2016   NEUTROABS 16.0 (H) 09/23/2016   HGB 8.3 (L) 09/23/2016   HCT 23.2 (L) 09/23/2016   MCV 95.5 09/23/2016   PLT 377 09/23/2016      Chemistry       Component Value Date/Time   NA 122 (L) 09/21/2016 0414   NA 123 (L) 09/15/2016 1131   K 4.2 09/21/2016 0414   K 4.7 09/15/2016 1131   CL 88 (L) 09/21/2016 0414   CL 105 05/06/2012 1333   CO2 25 09/21/2016 0414   CO2 25 09/15/2016 1131   BUN 13 09/21/2016 0414   BUN 11.9 09/15/2016 1131   CREATININE 0.59 09/21/2016 0414   CREATININE 0.7 09/15/2016 1131      Component Value Date/Time   CALCIUM 8.2 (L) 09/21/2016 0414   CALCIUM 9.3 09/15/2016 1131   ALKPHOS 643 (H) 09/21/2016 0414   ALKPHOS 560 (H) 09/15/2016 1131   AST 294 (H) 09/21/2016 0414   AST 273 (HH) 09/15/2016 1131   ALT 188 (H) 09/21/2016 0414   ALT 249 (H) 09/15/2016 1131   BILITOT 21.9 (HH) 09/21/2016 0414   BILITOT 18.65 (HH) 09/15/2016 1131      Recent Labs Lab 09/18/16 1316  INR 1.34    Urinalysis    Component Value Date/Time   COLORURINE AMBER (A) 08/26/2016 0334   APPEARANCEUR CLEAR 08/26/2016 0334   LABSPEC 1.015 08/26/2016 0334   LABSPEC 1.005 05/16/2016 1201   PHURINE 5.0 08/26/2016 0334   GLUCOSEU NEGATIVE 08/26/2016 0334   GLUCOSEU Negative 05/16/2016 1201   HGBUR SMALL (A) 08/26/2016 0334   BILIRUBINUR MODERATE (A) 08/26/2016 0334   BILIRUBINUR Negative 05/16/2016 1201   KETONESUR 20 (A) 08/26/2016 0334   PROTEINUR NEGATIVE 08/26/2016 0334   UROBILINOGEN 0.2 05/16/2016 1201   NITRITE NEGATIVE 08/26/2016 0334  LEUKOCYTESUR NEGATIVE 08/26/2016 0334   LEUKOCYTESUR Small 05/16/2016 1201     STUDIES: Nm Hepatobiliary Liver Func  Result Date: 08/26/2016 CLINICAL DATA:  Right upper quadrant pain EXAM: NUCLEAR MEDICINE HEPATOBILIARY IMAGING VIEWS: Anterior right upper quadrant RADIOPHARMACEUTICALS:  7.9 mCi Tc-41m Choletec IV COMPARISON:  Ultrasound right upper quadrant Aug 15, 2016 FINDINGS: Images were obtained serially over a 3 hour time span. Morphine was not administered due to history of allergic reaction to morphine. Liver uptake of radiotracer is inhomogeneous consistent with known  liver metastases. Over a 3 hour time span, there is no appreciable visualization of either gallbladder or small bowel. IMPRESSION: Nonvisualization of gallbladder over a 3 hour time span is suggestive of cystic duct obstruction/ acute cholecystitis. Note, however, that there is also nonvisualization of small bowel after 3 hours. This finding potentially could be indicative of common bile duct obstruction. However, this finding also could be indicative of hepatic stasis phenomenon. Serum bilirubin is moderately elevated at this time. Known liver metastases demonstrated by inhomogeneous uptake of radiotracer. Given lack of visualization of both gallbladder and small bowel, it may be prudent to consider MRCP to assess for possible common bile duct lesion as the cause for lack of small bowel visualization on this study. Electronically Signed   By: WLowella GripIII M.D.   On: 08/26/2016 16:13   UKoreaAbdomen Complete  Result Date: 08/26/2016 CLINICAL DATA:  RIGHT upper quadrant pain beginning yesterday. Jaundice. History of metastatic breast cancer with known liver metastasis. EXAM: ABDOMEN ULTRASOUND COMPLETE COMPARISON:  Abdominal ultrasound Aug 15, 2016 and MRI of the abdomen July 24, 2016 FINDINGS: Gallbladder: 1.9 cm echogenic gallstone with acoustic shadowing surrounded by mildly echogenic sludge with increased through transmission. Mild gallbladder wall thickening without pericholecystic fluid. Sonographic Murphy's sign elicited. Common bile duct: Diameter: 5 mm Liver: Multiple heterogeneous liver mass is consistent with patient's known metastasis. Porta hepatis lymphadenopathy present though, better characterized on prior MRI. IVC: No abnormality visualized. Pancreas: Visualized portion unremarkable. Spleen: Size and appearance within normal limits. Right Kidney: Length: 11.5 cm. Echogenicity within normal limits. No mass or hydronephrosis visualized. Left Kidney: Length: 11.8 cm. Echogenicity within  normal limits. No mass or hydronephrosis visualized. Abdominal aorta: No aneurysm visualized. Other findings: None. IMPRESSION: Cholelithiasis and sonographic findings of acute cholecystitis. Numerous hepatic and nodal metastasis. Electronically Signed   By: CElon AlasM.D.   On: 08/26/2016 06:36   Mr 3d Recon At Scanner  Result Date: 08/29/2016 CLINICAL DATA:  Metastatic breast cancer with liver metastasis. Evaluate for metastasis versus biliary obstruction. New chemotherapy. Jaundice. EXAM: MRI ABDOMEN WITHOUT AND WITH CONTRAST (INCLUDING MRCP) TECHNIQUE: Multiplanar multisequence MR imaging of the abdomen was performed both before and after the administration of intravenous contrast. Heavily T2-weighted images of the biliary and pancreatic ducts were obtained, and three-dimensional MRCP images were rendered by post processing. CONTRAST:  169mMULTIHANCE GADOBENATE DIMEGLUMINE 529 MG/ML IV SOLN COMPARISON:  Ultrasound 08/26/2016.  Most recent MRI of 07/24/2016. FINDINGS: Portions of exam are minimally motion degraded. Lower chest: Mild cardiomegaly. Left-sided pleural thickening remains. Index enhancing left pleural implant measures 1.4 cm on image 19/ series 11001. Compare 1.5 cm on the prior exam. More anterior implant measures 1.5 cm on image 20/ series 1101 compare 1.4 cm on the prior. Hepatobiliary: Extensive hepatic metastasis. Overall mild to (given short interval ) moderate progression. Example central right hepatic lobe lesion at 3.3 x 3.8 cm on image 47/ series 11001. Compare 2.8 x 3.5 cm on the prior.  Lateral segment left liver lobe lesion measures 3.1 cm on image 49/ series 11001. Compare 3.0 cm on the prior. A pericholecystic right liver lobe lesion measures 4.7 x 4.0 cm on image 60/ series 11001. Compare 3.8 x 2.9 cm on the prior. Interval development of moderate intrahepatic biliary duct dilatation bilaterally. This continues to the level of the porta hepatis. Example image 89/series 4 and  image 80/series 400. Amorphous soft tissue signal in this region is likely the cause, secondary to extension of porta hepatis infiltrative adenopathy. Example image 31/series 3 and image 49/ series 11002. A gallstone is again identified. There is moderate gallbladder wall thickening on image 36/series 3. The distal common duct is normal in caliber. Pancreas:  Normal, without mass or ductal dilatation. Spleen:  Normal in size, without focal abnormality. Adrenals/Urinary Tract: Normal adrenal glands. Normal kidneys, without hydronephrosis. Stomach/Bowel: Normal stomach and abdominal bowel loops. Vascular/Lymphatic: Aortic atherosclerosis. Circumaortic left renal vein. Porta hepatis adenopathy is progressive as detailed above. Example component in the portal caval space at 1.6 cm on image 31/series 3. Compare 1.4 cm on the prior. Progressive retroperitoneal adenopathy. A new node in the aortocaval space measures 1.3 cm on image 39/series 3. Other:  Trace perihepatic ascites. Musculoskeletal: Re- demonstration of osseous metastasis. Example lesion with the T11 vertebral body which measures 1.5 cm on image 22/series 12. Compare 1.4 cm on the prior. The inferior L3 lesion measures 2.8 x 1.6 cm on image 28/series 12 versus 2.9 x 1.8 cm on the prior. IMPRESSION: 1. Progression of hepatic metastasis. 2. Progressive adenopathy within the porta hepatis with increased retroperitoneal nodal metastasis. 3. Development of moderate intrahepatic biliary duct dilatation, secondary to soft tissue fullness in the porta hepatis, favored to related to infiltrative progressive adenopathy. Normal caliber common duct distally. 4. Cholelithiasis. Gallbladder wall thickening is nonspecific. Please see prior ultrasound. 5. Similar osseous and left pleural metastasis. Findings were reported to Dr. Loletha Carrow at 1:35 p.m. Electronically Signed   By: Abigail Miyamoto M.D.   On: 08/29/2016 13:36   Ir Fluoro Guide Cv Line Left  Result Date:  09/05/2016 INDICATION: Metastatic breast cancer to liver, poor venous access; request made for central venous access . EXAM: LEFT UPPER EXTREMITY PICC LINE PLACEMENT WITH ULTRASOUND AND FLUOROSCOPIC GUIDANCE MEDICATIONS: None ANESTHESIA/SEDATION: None FLUOROSCOPY TIME:  Fluoroscopy Time:  42 seconds COMPLICATIONS: None immediate. PROCEDURE: The patient was advised of the possible risks and complications and agreed to undergo the procedure. The patient was then brought to the angiographic suite for the procedure. The left arm was prepped with chlorhexidine, draped in the usual sterile fashion using maximum barrier technique (cap and mask, sterile gown, sterile gloves, large sterile sheet, hand hygiene and cutaneous antisepsis) and infiltrated locally with 1% Lidocaine. Ultrasound demonstrated patency of the left brachial vein, and this was documented with an image. Under real-time ultrasound guidance, this vein was accessed with a 21 gauge micropuncture needle and image documentation was performed. A 0.018 wire was introduced in to the vein. Over this, a 5 [place] double lumen power injectable PICC was advanced to the lower SVC/right atrial junction. Fluoroscopy during the procedure and fluoro spot radiograph confirms appropriate catheter position. The catheter was flushed and covered with a sterile dressing. Catheter length:  41 cm IMPRESSION: Successful left arm power PICC line placement with ultrasound and fluoroscopic guidance. The catheter is ready for use. Read by: Rowe Robert, PA-C Electronically Signed   By: Markus Daft M.D.   On: 09/05/2016 16:00   Ir US  Guide Vasc Access Left  Result Date: 09/05/2016 INDICATION: Metastatic breast cancer to liver, poor venous access; request made for central venous access . EXAM: LEFT UPPER EXTREMITY PICC LINE PLACEMENT WITH ULTRASOUND AND FLUOROSCOPIC GUIDANCE MEDICATIONS: None ANESTHESIA/SEDATION: None FLUOROSCOPY TIME:  Fluoroscopy Time:  42 seconds COMPLICATIONS: None  immediate. PROCEDURE: The patient was advised of the possible risks and complications and agreed to undergo the procedure. The patient was then brought to the angiographic suite for the procedure. The left arm was prepped with chlorhexidine, draped in the usual sterile fashion using maximum barrier technique (cap and mask, sterile gown, sterile gloves, large sterile sheet, hand hygiene and cutaneous antisepsis) and infiltrated locally with 1% Lidocaine. Ultrasound demonstrated patency of the left brachial vein, and this was documented with an image. Under real-time ultrasound guidance, this vein was accessed with a 21 gauge micropuncture needle and image documentation was performed. A 0.018 wire was introduced in to the vein. Over this, a 5 [place] double lumen power injectable PICC was advanced to the lower SVC/right atrial junction. Fluoroscopy during the procedure and fluoro spot radiograph confirms appropriate catheter position. The catheter was flushed and covered with a sterile dressing. Catheter length:  41 cm IMPRESSION: Successful left arm power PICC line placement with ultrasound and fluoroscopic guidance. The catheter is ready for use. Read by: Rowe Robert, PA-C Electronically Signed   By: Markus Daft M.D.   On: 09/05/2016 16:00   Mr Abdomen Mrcp W Wo Contast  Result Date: 08/29/2016 CLINICAL DATA:  Metastatic breast cancer with liver metastasis. Evaluate for metastasis versus biliary obstruction. New chemotherapy. Jaundice. EXAM: MRI ABDOMEN WITHOUT AND WITH CONTRAST (INCLUDING MRCP) TECHNIQUE: Multiplanar multisequence MR imaging of the abdomen was performed both before and after the administration of intravenous contrast. Heavily T2-weighted images of the biliary and pancreatic ducts were obtained, and three-dimensional MRCP images were rendered by post processing. CONTRAST:  20m MULTIHANCE GADOBENATE DIMEGLUMINE 529 MG/ML IV SOLN COMPARISON:  Ultrasound 08/26/2016.  Most recent MRI of 07/24/2016.  FINDINGS: Portions of exam are minimally motion degraded. Lower chest: Mild cardiomegaly. Left-sided pleural thickening remains. Index enhancing left pleural implant measures 1.4 cm on image 19/ series 11001. Compare 1.5 cm on the prior exam. More anterior implant measures 1.5 cm on image 20/ series 1101 compare 1.4 cm on the prior. Hepatobiliary: Extensive hepatic metastasis. Overall mild to (given short interval ) moderate progression. Example central right hepatic lobe lesion at 3.3 x 3.8 cm on image 47/ series 11001. Compare 2.8 x 3.5 cm on the prior. Lateral segment left liver lobe lesion measures 3.1 cm on image 49/ series 11001. Compare 3.0 cm on the prior. A pericholecystic right liver lobe lesion measures 4.7 x 4.0 cm on image 60/ series 11001. Compare 3.8 x 2.9 cm on the prior. Interval development of moderate intrahepatic biliary duct dilatation bilaterally. This continues to the level of the porta hepatis. Example image 89/series 4 and image 80/series 400. Amorphous soft tissue signal in this region is likely the cause, secondary to extension of porta hepatis infiltrative adenopathy. Example image 31/series 3 and image 49/ series 11002. A gallstone is again identified. There is moderate gallbladder wall thickening on image 36/series 3. The distal common duct is normal in caliber. Pancreas:  Normal, without mass or ductal dilatation. Spleen:  Normal in size, without focal abnormality. Adrenals/Urinary Tract: Normal adrenal glands. Normal kidneys, without hydronephrosis. Stomach/Bowel: Normal stomach and abdominal bowel loops. Vascular/Lymphatic: Aortic atherosclerosis. Circumaortic left renal vein. Porta hepatis adenopathy is progressive as  detailed above. Example component in the portal caval space at 1.6 cm on image 31/series 3. Compare 1.4 cm on the prior. Progressive retroperitoneal adenopathy. A new node in the aortocaval space measures 1.3 cm on image 39/series 3. Other:  Trace perihepatic ascites.  Musculoskeletal: Re- demonstration of osseous metastasis. Example lesion with the T11 vertebral body which measures 1.5 cm on image 22/series 12. Compare 1.4 cm on the prior. The inferior L3 lesion measures 2.8 x 1.6 cm on image 28/series 12 versus 2.9 x 1.8 cm on the prior. IMPRESSION: 1. Progression of hepatic metastasis. 2. Progressive adenopathy within the porta hepatis with increased retroperitoneal nodal metastasis. 3. Development of moderate intrahepatic biliary duct dilatation, secondary to soft tissue fullness in the porta hepatis, favored to related to infiltrative progressive adenopathy. Normal caliber common duct distally. 4. Cholelithiasis. Gallbladder wall thickening is nonspecific. Please see prior ultrasound. 5. Similar osseous and left pleural metastasis. Findings were reported to Dr. Loletha Carrow at 1:35 p.m. Electronically Signed   By: Abigail Miyamoto M.D.   On: 08/29/2016 13:36   Ir Int Lianne Cure Biliary Drain With Cholangiogram  Result Date: 09/06/2016 INDICATION: History of metastatic breast cancer, now with malignant obstructive jaundice. Request made for placement of percutaneous biliary drainage catheter in hopes of optimizing liver function tests to improve patient's candidacy for potential Y 90 or additional systemic therapies. EXAM: ULTRASOUND AND FLUOROSCOPIC GUIDED PERCUTANEOUS TRANSHEPATIC CHOLANGIOGRAM AND BILIARY TUBE PLACEMENT COMPARISON:  CT abdomen pelvis - 09/05/2016; abdominal MRI - 08/29/2016 MEDICATIONS: Zosyn 3.375 g IV ; the antibiotic was administered with an appropriate time frame prior to the initiation of the procedure. CONTRAST:  43m ISOVUE-300 IOPAMIDOL (ISOVUE-300) INJECTION 61% - administered into the biliary tree ANESTHESIA/SEDATION: Moderate (conscious) sedation was employed during this procedure. A total of Versed 5 mg and Fentanyl 250 mcg was administered intravenously. Moderate Sedation Time: 57 minutes. The patient's level of consciousness and vital signs were monitored  continuously by radiology nursing throughout the procedure under my direct supervision. FLUOROSCOPY TIME:  10 minutes 54 seconds (2539mGy) COMPLICATIONS: None immediate. TECHNIQUE: Informed written consent was obtained from the patient after a discussion of the risks, benefits and alternatives to treatment. Questions regarding the procedure were encouraged and answered. A timeout was performed prior to the initiation of the procedure. The midline of the upper abdominal quadrant was prepped and draped in the usual sterile fashion, and a sterile drape was applied covering the operative field. Maximum barrier sterile technique with sterile gowns and gloves were used for the procedure. A timeout was performed prior to the initiation of the procedure. Under direct ultrasound guidance, a dilated duct within the peripheral aspect of the left lobe of the liver was accessed with a 2Shelbyneedle. Contrast injection confirmed appropriate positioning. A Nitrex wire was advanced to the level of the malignant obstruction at the level the biliary hilum. Despite prolonged efforts, the inner 3 French catheter from the [place] could not be advanced into the biliary tree given the cranial location of the left lobe of the liver as well as the angulation of the accessed duct. As such, the more peripheral aspect of the dominant dilated duct within the left lobe of the liver was accessed with an additional 2Lapelneedle. A Nitrex wire was advanced to the level of the malignant obstruction at the level of the biliary hilum. Again, there is difficulty advancing the inner 3 French catheter from the Accustick set and as such, a stiff micro puncture sheath  sheath was utilized to secure the access in to biliary tree and allowed for placement of an Amplatz wire which was coiled within the biliary hilum. Under intermittent fluoroscopic guidance, the micropuncture sheath was exchanged for a a 4 French angled glide catheter  and ultimately, a Kumpe catheter, which was utilized to manipulate the Amplatz wire through the common bile duct to the level of the duodenum. Contrast injection confirmed appropriate positioning. The Kumpe catheter was utilized for measurement purposes. Additional sideholes were cut approximately 2 cm peripheral to the radiopaque marker of an 8 French percutaneous biliary drainage catheter. Under intermittent fluoroscopic guidance, an 8 French percutaneous biliary drainage catheter was advanced over an Amplatz wire with tip ultimately coiled within the descending duodenum. Contrast injection was performed demonstrating appropriate positioning and functionality of the biliary drainage catheter. Multiple spot fluoroscopic radiographic image were obtained in various obliquities. The catheter was connected to a drainage bag which yielded the brisk return of clear bile. The catheter was secured to the skin with an interrupted suture. The patient tolerated the procedure well without immediate postprocedural complication. FINDINGS: Ultrasound scanning demonstrates several hypoechoic lesions within the left lobe of liver compatible with known hepatic metastatic disease. Moderate intrahepatic biliary duct dilatation was again demonstrated within the left lobe of the liver Ultimately, under direct ultrasound guidance, a peripherally dilated duct in the left lobe of the liver was accessed allowing placement of a 8 French percutaneous biliary drainage catheter with and coiled and locked within the duodenum. Note, an 8 [place] percutaneous drain was placed in lieu of a 10 [place] as there was difficulty advancing the Accustick set due to the cranial location of the left lobe of the liver and required angulation of the access bile duct. IMPRESSION: Successful placement of an 8 French percutaneous biliary drainage catheter with end coiled and locked within the duodenum. PLAN: - Patient will be followed with daily CMP levels in  hopes of improvement in serum bilirubin levels. - Pending the trend of the patient's LFTs, the patient will likely return for repeat cholangiogram and potential biliary drainage catheter exchange and up sizing with potential capping later this week. - Ultimately, the patient's bilirubin will need to improve 2 to be a candidate for Y 90 Radioembolization. Electronically Signed   By: [name] [name] M.D.   On: 09/06/2016 11:15   Ir Exchange Biliary Drain  Result Date: 09/18/2016 INDICATION: Metastatic breast carcinoma with biliary obstruction, status post internal/external drain catheter placement 09/06/2016. There was an initial improvement in the patient's bilirubin, which has since elevated above preprocedure level. EXAM: CHOLANGIOGRAM THROUGH EXISTING CATHETER EXCHANGE AND REVISION OF INTERNAL-EXTERNAL BILIARY DRAIN MEDICATIONS: Zosyn 3.375 g IV; The antibiotic was administered within an appropriate time frame prior to the initiation of the procedure. ANESTHESIA/SEDATION: Intravenous Fentanyl and Versed were administered as conscious sedation during continuous monitoring of the patient's level of consciousness and physiological / cardiorespiratory status by the radiology RN, with a total moderate sedation time of 15 minutes. PROCEDURE: Informed written consent was obtained from the patient after a thorough discussion of the procedural risks, benefits and alternatives. All questions were addressed. Maximal Sterile Barrier Technique was utilized including caps, mask, sterile gowns, sterile gloves, sterile drape, hand hygiene and skin antiseptic. A timeout was performed prior to the initiation of the procedure. Lidocaine 1% was administered subcutaneously around the catheter. Cholangiogram through the existing internal -external biliary drain catheter was performed. The catheter was cut and exchanged over a Bentson wire for a 5 [place] Kumpe catheter,  advanced into the distal duodenum. This was then exchanged over an  Amplatz wire for serial vascular dilators which facilitated advancement of a 12 [place] internal external biliary drain catheter, placed with side hole spanning the central left bile duct through the CBD into the duodenum. Catheter injection confirms appropriate positioning. The patient tolerated the procedure well. FLUOROSCOPY TIME:  2.2 minutes, 390  uGym2 DAP COMPLICATIONS: None immediate. FINDINGS: The initial cholangiogram demonstrated stable position of the previously placed 8 [place] internal external drain catheter with additional sideholes. The left biliary tree was decompressed. The posterior right biliary tree was incompletely opacified but appear decompressed. The anterior right biliary tree was dilated centrally with focal high-grade stenosis near the confluence with the left duct. The catheter was exchanged and upsized to a 12 [place] internal -external drain catheter as above, without complication. IMPRESSION: 1. Continued patency of the previously placed biliary drain catheter with decompression of the left biliary system and a portion of the posterior right biliary tree. 2. Technically successful exchange and up-sizing to a 12 [place] internal external device, placed to external drainage. Electronically Signed   By: Lucrezia Europe M.D.   On: 09/18/2016 16:55   Ct Angio Abd/pel W/ And/or W/o  Result Date: 09/05/2016 CLINICAL DATA:  History of metastatic breast cancer with progressive hepatic metastatic disease. Please perform CTA for evaluation of candidacy for Y 90 radioembolization. EXAM: CTA ABDOMEN AND PELVIS WITH CONTRAST TECHNIQUE: Multidetector CT imaging of the abdomen and pelvis was performed using the standard protocol during bolus administration of intravenous contrast. Multiplanar reconstructed images and MIPs were obtained and reviewed to evaluate the vascular anatomy. CONTRAST:  100 cc Isovue 370 COMPARISON:  Abdominal MRI - 08/29/2016 ; 07/24/2016; PET-CT -05/14/2016 FINDINGS: VASCULAR  Aorta: Minimal amount of atherosclerotic plaque within a normal caliber abdominal aorta, not resulting in hemodynamically significant stenosis. No abdominal aortic dissection or periaortic stranding. Celiac: Widely patent. Conventional takeoff of the GDA. Note is made of separate origins of the left lateral hepatic segmental artery with middle hepatic artery which supplies the medial segment of the left lobe of the liver as well as a portion of the anterior segment of the right lobe of the liver. Otherwise, conventional branching pattern. The right gastric artery is not definitely identified. SMA: Widely patent.  Conventional branching pattern. Renals: Solitary bilaterally. The bilateral renal arteries are widely patent without hemodynamically significant narrowing. No vessel irregularity to suggest FMD. IMA: Widely patent. Inflow: The bilateral common, external and internal iliac artery is are tortuous lobe normal caliber and widely patent without hemodynamically significant stenosis. Proximal Outflow: The bilateral common, superficial and deep femoral arteries appear widely patent throughout their imaged course. Veins: The IVC and pelvic venous system appears widely patent. Review of the MIP images confirms the above findings. NON-VASCULAR Lower chest: Limited visualization of the lower thorax demonstrates consolidative opacities within the imaged left lower lobe with associated volume loss and trace left-sided pleural effusion. Known hypermetabolic nodule within the medial basilar aspect the left pleural space is grossly unchanged measuring 1.5 cm in diameter (image 8, series 5). No new focal airspace opacities. Normal heart size.  No pericardial effusion. Hepatobiliary: Normal hepatic contour, however loculated fluid/bile is noted about the subcapsular aspect of the right lobe of the liver (image 29, series 16), similar to recent abdominal MRI. Re- demonstrated extensive hepatic metastatic disease, similar to  recently obtained abdominal MRI with dominant lesion within the dome of the right lobe of liver measuring approximately 2.6 x 2.6 cm (image  24, series 6 dominant lesion within the subcapsular aspect the right lobe of the liver measuring approximately 3.0 x 3.7 cm (image 42, series 6) and dominant lesion within the subcapsular aspect the right lobe of the liver measuring approximately 7.5 x 4.2 cm (image 66). Re- demonstrated intrahepatic biliary duct dilatation with occlusion of the biliary system at the level the hilum. The main portal vein is tapered/narrowed at the level of the biliary hilum though needs patent. A laminated approximately 1.6 cm gallstone is noted within the fundus of the gallbladder (image 86, series 4) with associated minimal amount of pericholecystic stranding. Pancreas: Normal appearance of the pancreas Spleen: Normal appearance of the spleen Adrenals/Urinary Tract: There is symmetric enhancement and excretion of the bilateral kidneys. No definite renal stones this postcontrast examination. No discrete renal lesions. No urine obstruction or perinephric stranding. Normal appearance the bilateral adrenal glands. Normal appearance of the urinary bladder given degree distention. Stomach/Bowel: Scattered colonic diverticulosis without evidence of diverticulitis. Normal appearance of the terminal ileum. The appendix is not visualized, however there is no pericecal inflammatory change. No pneumoperitoneum, pneumatosis or portal venous gas. Lymphatic: Compared to PET-CT performed 05/14/2016, there has been development of porta hepatis and retroperitoneal lymphadenopathy with index left-sided periaortic lymph node measuring 1.3 cm in greatest short axis diameter (image 70, series 4), index aortocaval lymph nodes measuring 1.2 cm (image 79) and 1.1 cm (image 100) and index gastrohepatic lymph ligament lymph node measuring 0.9 cm (image 46, series 4 Reproductive: Normal appearance of the pelvic organs.  Small amount of free fluid in the pelvic cul-de-sac. Other: Regional soft tissues appear normal. Musculoskeletal: Moderate scoliotic curvature of the thoracolumbar spine with dominant caudal component convex to the left measuring approximately 30 degrees (as measured from the superior endplate of J24 to the inferior endplate of L4. Similar appearance of known pathologic fracture involving the right inferior pubic ramus (image 205, series 4). Approximately 1.3 x 1.5 cm sclerotic lesion within the right-side of the T11 vertebral body is unchanged (image 15, series 16). Potential new approximately 1.8 x 1.7 cm lytic lesion involving the posterior aspects of the L3 vertebral body (sagittal image 115, series 9). IMPRESSION: VASCULAR 1. Minimal amount of atherosclerotic plaque with a normal caliber abdominal aorta. Aortic Atherosclerosis (ICD10-I70.0). 2. Conventional anatomy amenable to Y 90 radioembolization. NON-VASCULAR 1. Compared to recent obtain abdominal MRI, unchanged extensive hepatic metastatic disease, primarily affecting the right lobe of the liver with obstruction of the biliary system at the level the hilum with associated intrahepatic biliary duct dilatation affecting both the right and left lobes of the liver. 2. Compared to PET-CT performed 05/14/2016, interval development of porta hepatis and retroperitoneal lymphadenopathy as detailed above. 3. Grossly unchanged metastatic disease within the imaged left lower lung / pleural as detailed above. 4. Potential new approximately 1.8 cm lytic lesion involving the posterior aspect of the L3 vertebral body. Further evaluation lumbar spine MRI could performed as clinically indicated. Electronically Signed   By: [name] [name] M.D.   On: 09/05/2016 16:57    ASSESSMENT: 56 y.o. BRCA negative  woman with stage IV breast cancer, history as follows  (1)  S/p Right upper inner quadrant lumpectomy and sentinel lymph node sampling 03/15/2004 for a pT1c pN0.  Stage IA invasive ductal carcinoma, grade 2, estrogen receptor 95% positive, progesterone receptor 65% positive, HER-2 not amplified; additional surgery 04/25/2004 for seroma or clearance showed no residual tumor  (2) adjuvant chemotherapy with cyclophosphamide and doxorubicin every 21 days x4 completed 07/19/2004  (  3) adjuvant radiation given under Dr. Donella Stade in [place] completed July 2006  (4) the patient opted against adjuvant antiestrogen therapy  (5) genetics testing showed no BRCA mutations  (6) biopsy of a palpable right axillary mass 10/24/2009 showed invasive ductal carcinoma, grade 3, estrogen receptor 100% positive, progesterone receptor 2% positive (alert score 5) HER-2 negative; no evidence of systemic disease on PET scanning  (7) completed 3 of 4 planned cycles of docetaxel and cyclophosphamide September 2011, fourth cycle omitted because of marked elevations in liver function tests  (8) an right axillary lymph node dissection 03/06/2010 showed 3/8 lymph nodes removed to be involved by tumor, with extracapsular extension.  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, TTS-1 negative), estrogen receptor 80% positive, progesterone receptor 2% positive, HER-2 not amplified  (12) letrozole started March 2015-- tolerated with significant side effects, discontinued at the end of May 2015  (13) PET scan 08/16/2013 shows extensive left pleural metastatic disease and a large left pleural effusion that shifts cardiac and mediastinal structures to the right; adenopathy (celiac trunk,  periadrenal, periaortic); and a left medial clavicular lesion; Status post left thoracentesis 08/16/2013 positive for adenocarcinoma, estrogen receptor positive, progesterone receptor negative.  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; everolimus was suggested as an available targeted agent  (18) exemestane started 03/31/2014, discontinued 10/31/2014 with evidence of progression  (a) everolimus added 04/03/2014 but not tolerated (cytopenias, elevated LFTs) even at minimal doses; stopped 04/17/2014  (19) fulvestrant started 12/20/2014  (a) palbociclib added at very low dose 04/03/2015 (starting dose 75 mg weekly)  (b) palbociclib dose gradually increased to 75 mg daily, 21/7, as of May 2017  (c) palbociclib dose increased to 100 mg daily, 21/ 7, beginning November mid- cycle  (d) palbociclib dose decreased to 75 mg daily beginning with cycle starting 05/25/2016  (e) letrozole 2.5 mg started 05/26/2016, held as of 07/25/2016 with poor tolerance  (f) palbociclib dose decreased to 75 mg every other day beginning 07/25/2016  (20) liver biopsy 03/20/2015 confirms metastatic carcinoma, still estrogen receptor positive at 100%, progesterone receptor negative, HER-2 equivocal with a signals ratio 1.41, number per cell 4.50.   (a) repeat liver biopsy December 2017 might show further changes (HER-2 positivity)?  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 treated with Lovenox for 5-6 months  (c) right upper extremity lymphedema--receiving physical therapy  (d) hepatic steatosis with chronically elevated LFTs as well as unusual hepatic sensitivity to chemotherapy  (e) osteopenia with the lowest T score -1.6 on bone density scan 06/20/2013  (i) on denosumab/ Xgeva Q28d  (f) radiation oncology (Dr Valere Dross) has reviewed prior radiation records in case there is further mediastinal involvement with dysphagia etc in which case palliative XRT could be considered  (a) radiation to left mediastinum/ left 7th rib 3250 cGy in 13 sessions04/18/2016 through 08/02/2014  (b) radiation to T11 area: 22 Gy in 7 sessions, last dose 11/27/2014  (c) radiation left parietal scalp region to be completed 12/22/2014  (d) radiation to sacral area completed 04/09/2015  (e) radiation to right inferior pelvis and left ninth rib (24 gray, 12/24/2015--01/02/2016)  (f) T11 was treated stereotactically with 14 gray in 1 fraction 03/12/2016.  (g) chest wall and perineal pain--improved post radiation treatments  (a) discussed celebrex/ carafate but demurs  (h) zoster diagnosed 04/04/2015-- on valacyclovir--resolved   PLAN: I met with Santiago Bumpers for approximately 20 minutes today. Unfortunately her total bilirubin has not declined, but at least it is no higher. Her sodium is also stable.  There are not quite settled in their support at  home. They're having significant problems with advanced home care that doesn't seem to understand the instructions we have given them. We have placed a hospice referral and hopefully once that is in place things will get a little bit easier.  She did not tolerate morphine. I have written her for a fentanyl sublingual 800 g pill that she can take as needed if her current pain medications do not work.  She has a bandage on a strict no salt and 1200 mL fluid restriction diet in favor of better quality of life. She still is going to go on a low salt diet and some water restriction. She understands she is not going to be able to tolerate any count of fats. She has to concentrate on carbohydrates and proteins and we discussed that in detail as well today.  She will see me again in one week. She knows to call for any problems that may develop before then.   [name] Cruel, MD   09/23/2016 12:53 PM

## 2016-09-23 NOTE — Discharge Summary (Signed)
Physician Discharge Summary  Patient ID: [name] [name] MRN: [id number] DOB/AGE: 09/21/1960 56 y.o.  Admit date: 09/18/2016 Discharge date: 09/21/2016  Primary Care Physician:  Chauncey Cruel, MD   Discharge Diagnoses: Intrahepatic cholestasis, hyperbilirubinemia, poorly controlled pain, metastatic breast cancer, moderate malnutrition,    Present on Admission: . Abnormal LFTs (liver function tests) . Chest wall pain . Cholelithiasis . Cholestasis, intrahepatic . Constipation . Jaundice . Left-sided thoracic back pain . Liver metastases ([place]) . Malignant neoplasm of female breast ([place]) . Bone metastases ([place]) . 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 Malignant pleural effusion, left . Nausea with vomiting . Rigors . RUQ pain . 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    Dilaudid [hydromorphone] Nausea And Vomiting   Enoxaparin Other (See Comments)   unknown   Fluconazole Swelling   Liver toxicity   Hydromorphone Hcl Nausea And Vomiting   Morphine And Related Nausea And Vomiting   Ondansetron Other (See Comments)   "makes me loopy"   Protonix [pantoprazole Sodium] Other (See Comments)   Patient reports it caused thrush.   Tegaderm Ag Mesh [silver]       Medication List    TAKE these medications   alum & mag hydroxide-simeth 200-200-20 MG/5ML suspension Commonly known as:  MAALOX/MYLANTA Take 30 mLs by mouth every 6 (six) hours as needed for indigestion, heartburn or flatulence (Bloating,gas pain).   B-complex with vitamin C tablet Take 1 tablet by mouth daily. Reported on 03/27/2015   betamethasone valerate ointment 0.1 % Commonly known as:  VALISONE Apply 1  application topically 2 (two) times daily.   calcium-vitamin D 500-200 MG-UNIT tablet Commonly known as:  OSCAL WITH D Take 2 tablets by mouth 2 (two) times daily.   docusate sodium 100 MG capsule Commonly known as:  COLACE Take 1 capsule (100 mg total) by mouth 2 (two) times daily.   famotidine 40 MG tablet Commonly known as:  PEPCID Take 0.5-1 tablets (20-40 mg total) by mouth 2 (two) times daily. 40 mg in the morning and 20 mg in the evening What changed:  how much to take  additional instructions   famotidine 40 MG tablet Commonly known as:  PEPCID Take 1 tablet (40 mg total) by mouth 2 (two) times daily. What changed:  You were already taking a medication with the same name, and this prescription was added. Make sure you understand how and when to take each.   FLORASTOR 250 MG capsule Generic drug:  saccharomyces boulardii Take 250 mg by mouth daily.   folic acid 1 MG tablet Commonly known as:  FOLVITE Take 1 tablet (1 mg total) by mouth daily. What changed:  Another medication with the same name was added. Make sure you understand how and when to take each.   folic acid 1 MG tablet Commonly known as:  FOLVITE Take 1 tablet (1 mg total) by mouth daily. What changed:  You were already taking a medication with the same name, and this prescription was added. Make sure you understand how and when to take each.   ketorolac 10 MG tablet Commonly known as:  TORADOL Take 1 tablet (10 mg total) by mouth every 6 (six) hours as needed.  What changed:  when to take this   levofloxacin 500 MG tablet Commonly known as:  LEVAQUIN Take 1 tablet (500 mg total) by mouth daily.   LORazepam 0.5 MG tablet Commonly known as:  ATIVAN Take 1 tablet (0.5 mg total) by mouth every 8 (eight) hours as needed for anxiety.   Melatonin 3 MG Tabs Take 3 mg by mouth at bedtime.   morphine CONCENTRATE 10 MG/0.5ML Soln concentrated solution Take 0.5 mLs (10 mg total) by mouth every 2 (two)  hours as needed for moderate pain or shortness of breath.   polyethylene glycol packet Commonly known as:  MIRALAX / GLYCOLAX Take 17 g by mouth daily.   promethazine 12.5 MG tablet Commonly known as:  PHENERGAN Take 1 tablet (12.5 mg total) by mouth every 6 (six) hours as needed for nausea.   traMADol 50 MG tablet Commonly known as:  ULTRAM Take 1-2 tablets (50-100 mg total) by mouth every 6 (six) hours as needed. What changed:  when to take this   triamterene-hydrochlorothiazide 37.5-25 MG capsule Commonly known as:  DYAZIDE Take 1 each (1 capsule total) by mouth daily as needed (weight gain).   Vitamin D 2000 units tablet Take 1 tablet (2,000 Units total) by mouth daily.        Disposition and Follow-up: Discharged to home with hospice referral and home health nursing support  Significant Diagnostic Studies:  Nm Hepatobiliary Liver Func  Result Date: 08/26/2016 CLINICAL DATA:  Right upper quadrant pain EXAM: NUCLEAR MEDICINE HEPATOBILIARY IMAGING VIEWS: Anterior right upper quadrant RADIOPHARMACEUTICALS:  7.9 mCi Tc-16m  Choletec IV COMPARISON:  Ultrasound right upper quadrant Aug 15, 2016 FINDINGS: Images were obtained serially over a 3 hour time span. Morphine was not administered due to history of allergic reaction to morphine. Liver uptake of radiotracer is inhomogeneous consistent with known liver metastases. Over a 3 hour time span, there is no appreciable visualization of either gallbladder or small bowel. IMPRESSION: Nonvisualization of gallbladder over a 3 hour time span is suggestive of cystic duct obstruction/ acute cholecystitis. Note, however, that there is also nonvisualization of small bowel after 3 hours. This finding potentially could be indicative of common bile duct obstruction. However, this finding also could be indicative of hepatic stasis phenomenon. Serum bilirubin is moderately elevated at this time. Known liver metastases demonstrated by inhomogeneous  uptake of radiotracer. Given lack of visualization of both gallbladder and small bowel, it may be prudent to consider MRCP to assess for possible common bile duct lesion as the cause for lack of small bowel visualization on this study. Electronically Signed   By: Lowella Grip III M.D.   On: 08/26/2016 16:13   US Abdomen Complete  Result Date: 08/26/2016 CLINICAL DATA:  RIGHT upper quadrant pain beginning yesterday. Jaundice. History of metastatic breast cancer with known liver metastasis. EXAM: ABDOMEN ULTRASOUND COMPLETE COMPARISON:  Abdominal ultrasound Aug 15, 2016 and MRI of the abdomen July 24, 2016 FINDINGS: Gallbladder: 1.9 cm echogenic gallstone with acoustic shadowing surrounded by mildly echogenic sludge with increased through transmission. Mild gallbladder wall thickening without pericholecystic fluid. Sonographic Murphy's sign elicited. Common bile duct: Diameter: 5 mm Liver: Multiple heterogeneous liver mass is consistent with patient's known metastasis. Porta hepatis lymphadenopathy present though, better characterized on prior MRI. IVC: No abnormality visualized. Pancreas: Visualized portion unremarkable. Spleen: Size and appearance within normal limits. Right Kidney: Length: 11.5 cm. Echogenicity within normal limits. No mass or hydronephrosis visualized. Left Kidney: Length: 11.8 cm. Echogenicity within normal limits. No mass  or hydronephrosis visualized. Abdominal aorta: No aneurysm visualized. Other findings: None. IMPRESSION: Cholelithiasis and sonographic findings of acute cholecystitis. Numerous hepatic and nodal metastasis. Electronically Signed   By: Elon Alas M.D.   On: 08/26/2016 06:36   Mr 3d Recon At Scanner  Result Date: 08/29/2016 CLINICAL DATA:  Metastatic breast cancer with liver metastasis. Evaluate for metastasis versus biliary obstruction. New chemotherapy. Jaundice. EXAM: MRI ABDOMEN WITHOUT AND WITH CONTRAST (INCLUDING MRCP) TECHNIQUE: Multiplanar  multisequence MR imaging of the abdomen was performed both before and after the administration of intravenous contrast. Heavily T2-weighted images of the biliary and pancreatic ducts were obtained, and three-dimensional MRCP images were rendered by post processing. CONTRAST:  58mL MULTIHANCE GADOBENATE DIMEGLUMINE 529 MG/ML IV SOLN COMPARISON:  Ultrasound 08/26/2016.  Most recent MRI of 07/24/2016. FINDINGS: Portions of exam are minimally motion degraded. Lower chest: Mild cardiomegaly. Left-sided pleural thickening remains. Index enhancing left pleural implant measures 1.4 cm on image 19/ series 11001. Compare 1.5 cm on the prior exam. More anterior implant measures 1.5 cm on image 20/ series 1101 compare 1.4 cm on the prior. Hepatobiliary: Extensive hepatic metastasis. Overall mild to (given short interval ) moderate progression. Example central right hepatic lobe lesion at 3.3 x 3.8 cm on image 47/ series 11001. Compare 2.8 x 3.5 cm on the prior. Lateral segment left liver lobe lesion measures 3.1 cm on image 49/ series 11001. Compare 3.0 cm on the prior. A pericholecystic right liver lobe lesion measures 4.7 x 4.0 cm on image 60/ series 11001. Compare 3.8 x 2.9 cm on the prior. Interval development of moderate intrahepatic biliary duct dilatation bilaterally. This continues to the level of the porta hepatis. Example image 89/series 4 and image 80/series 400. Amorphous soft tissue signal in this region is likely the cause, secondary to extension of porta hepatis infiltrative adenopathy. Example image 31/series 3 and image 49/ series 11002. A gallstone is again identified. There is moderate gallbladder wall thickening on image 36/series 3. The distal common duct is normal in caliber. Pancreas:  Normal, without mass or ductal dilatation. Spleen:  Normal in size, without focal abnormality. Adrenals/Urinary Tract: Normal adrenal glands. Normal kidneys, without hydronephrosis. Stomach/Bowel: Normal stomach and  abdominal bowel loops. Vascular/Lymphatic: Aortic atherosclerosis. Circumaortic left renal vein. Porta hepatis adenopathy is progressive as detailed above. Example component in the portal caval space at 1.6 cm on image 31/series 3. Compare 1.4 cm on the prior. Progressive retroperitoneal adenopathy. A new node in the aortocaval space measures 1.3 cm on image 39/series 3. Other:  Trace perihepatic ascites. Musculoskeletal: Re- demonstration of osseous metastasis. Example lesion with the T11 vertebral body which measures 1.5 cm on image 22/series 12. Compare 1.4 cm on the prior. The inferior L3 lesion measures 2.8 x 1.6 cm on image 28/series 12 versus 2.9 x 1.8 cm on the prior. IMPRESSION: 1. Progression of hepatic metastasis. 2. Progressive adenopathy within the porta hepatis with increased retroperitoneal nodal metastasis. 3. Development of moderate intrahepatic biliary duct dilatation, secondary to soft tissue fullness in the porta hepatis, favored to related to infiltrative progressive adenopathy. Normal caliber common duct distally. 4. Cholelithiasis. Gallbladder wall thickening is nonspecific. Please see prior ultrasound. 5. Similar osseous and left pleural metastasis. Findings were reported to Dr. Loletha Carrow at 1:35 p.m. Electronically Signed   By: Abigail Miyamoto M.D.   On: 08/29/2016 13:36   Ir Fluoro Guide Cv Line Left  Result Date: 09/05/2016 INDICATION: Metastatic breast cancer to liver, poor venous access; request made for central venous access .  EXAM: LEFT UPPER EXTREMITY PICC LINE PLACEMENT WITH ULTRASOUND AND FLUOROSCOPIC GUIDANCE MEDICATIONS: None ANESTHESIA/SEDATION: None FLUOROSCOPY TIME:  Fluoroscopy Time:  42 seconds COMPLICATIONS: None immediate. PROCEDURE: The patient was advised of the possible risks and complications and agreed to undergo the procedure. The patient was then brought to the angiographic suite for the procedure. The left arm was prepped with chlorhexidine, draped in the usual sterile  fashion using maximum barrier technique (cap and mask, sterile gown, sterile gloves, large sterile sheet, hand hygiene and cutaneous antisepsis) and infiltrated locally with 1% Lidocaine. Ultrasound demonstrated patency of the left brachial vein, and this was documented with an image. Under real-time ultrasound guidance, this vein was accessed with a 21 gauge micropuncture needle and image documentation was performed. A 0.018 wire was introduced in to the vein. Over this, a 5 [place] double lumen power injectable PICC was advanced to the lower SVC/right atrial junction. Fluoroscopy during the procedure and fluoro spot radiograph confirms appropriate catheter position. The catheter was flushed and covered with a sterile dressing. Catheter length:  41 cm IMPRESSION: Successful left arm power PICC line placement with ultrasound and fluoroscopic guidance. The catheter is ready for use. Read by: Rowe Robert, PA-C Electronically Signed   By: Markus Daft M.D.   On: 09/05/2016 16:00   Ir US Guide Vasc Access Left  Result Date: 09/05/2016 INDICATION: Metastatic breast cancer to liver, poor venous access; request made for central venous access . EXAM: LEFT UPPER EXTREMITY PICC LINE PLACEMENT WITH ULTRASOUND AND FLUOROSCOPIC GUIDANCE MEDICATIONS: None ANESTHESIA/SEDATION: None FLUOROSCOPY TIME:  Fluoroscopy Time:  42 seconds COMPLICATIONS: None immediate. PROCEDURE: The patient was advised of the possible risks and complications and agreed to undergo the procedure. The patient was then brought to the angiographic suite for the procedure. The left arm was prepped with chlorhexidine, draped in the usual sterile fashion using maximum barrier technique (cap and mask, sterile gown, sterile gloves, large sterile sheet, hand hygiene and cutaneous antisepsis) and infiltrated locally with 1% Lidocaine. Ultrasound demonstrated patency of the left brachial vein, and this was documented with an image. Under real-time ultrasound  guidance, this vein was accessed with a 21 gauge micropuncture needle and image documentation was performed. A 0.018 wire was introduced in to the vein. Over this, a 5 [place] double lumen power injectable PICC was advanced to the lower SVC/right atrial junction. Fluoroscopy during the procedure and fluoro spot radiograph confirms appropriate catheter position. The catheter was flushed and covered with a sterile dressing. Catheter length:  41 cm IMPRESSION: Successful left arm power PICC line placement with ultrasound and fluoroscopic guidance. The catheter is ready for use. Read by: Rowe Robert, PA-C Electronically Signed   By: Markus Daft M.D.   On: 09/05/2016 16:00   Mr Abdomen Mrcp W Wo Contast  Result Date: 08/29/2016 CLINICAL DATA:  Metastatic breast cancer with liver metastasis. Evaluate for metastasis versus biliary obstruction. New chemotherapy. Jaundice. EXAM: MRI ABDOMEN WITHOUT AND WITH CONTRAST (INCLUDING MRCP) TECHNIQUE: Multiplanar multisequence MR imaging of the abdomen was performed both before and after the administration of intravenous contrast. Heavily T2-weighted images of the biliary and pancreatic ducts were obtained, and three-dimensional MRCP images were rendered by post processing. CONTRAST:  51mL MULTIHANCE GADOBENATE DIMEGLUMINE 529 MG/ML IV SOLN COMPARISON:  Ultrasound 08/26/2016.  Most recent MRI of 07/24/2016. FINDINGS: Portions of exam are minimally motion degraded. Lower chest: Mild cardiomegaly. Left-sided pleural thickening remains. Index enhancing left pleural implant measures 1.4 cm on image 19/ series 11001. Compare 1.5 cm  on the prior exam. More anterior implant measures 1.5 cm on image 20/ series 1101 compare 1.4 cm on the prior. Hepatobiliary: Extensive hepatic metastasis. Overall mild to (given short interval ) moderate progression. Example central right hepatic lobe lesion at 3.3 x 3.8 cm on image 47/ series 11001. Compare 2.8 x 3.5 cm on the prior. Lateral segment left  liver lobe lesion measures 3.1 cm on image 49/ series 11001. Compare 3.0 cm on the prior. A pericholecystic right liver lobe lesion measures 4.7 x 4.0 cm on image 60/ series 11001. Compare 3.8 x 2.9 cm on the prior. Interval development of moderate intrahepatic biliary duct dilatation bilaterally. This continues to the level of the porta hepatis. Example image 89/series 4 and image 80/series 400. Amorphous soft tissue signal in this region is likely the cause, secondary to extension of porta hepatis infiltrative adenopathy. Example image 31/series 3 and image 49/ series 11002. A gallstone is again identified. There is moderate gallbladder wall thickening on image 36/series 3. The distal common duct is normal in caliber. Pancreas:  Normal, without mass or ductal dilatation. Spleen:  Normal in size, without focal abnormality. Adrenals/Urinary Tract: Normal adrenal glands. Normal kidneys, without hydronephrosis. Stomach/Bowel: Normal stomach and abdominal bowel loops. Vascular/Lymphatic: Aortic atherosclerosis. Circumaortic left renal vein. Porta hepatis adenopathy is progressive as detailed above. Example component in the portal caval space at 1.6 cm on image 31/series 3. Compare 1.4 cm on the prior. Progressive retroperitoneal adenopathy. A new node in the aortocaval space measures 1.3 cm on image 39/series 3. Other:  Trace perihepatic ascites. Musculoskeletal: Re- demonstration of osseous metastasis. Example lesion with the T11 vertebral body which measures 1.5 cm on image 22/series 12. Compare 1.4 cm on the prior. The inferior L3 lesion measures 2.8 x 1.6 cm on image 28/series 12 versus 2.9 x 1.8 cm on the prior. IMPRESSION: 1. Progression of hepatic metastasis. 2. Progressive adenopathy within the porta hepatis with increased retroperitoneal nodal metastasis. 3. Development of moderate intrahepatic biliary duct dilatation, secondary to soft tissue fullness in the porta hepatis, favored to related to infiltrative  progressive adenopathy. Normal caliber common duct distally. 4. Cholelithiasis. Gallbladder wall thickening is nonspecific. Please see prior ultrasound. 5. Similar osseous and left pleural metastasis. Findings were reported to Dr. Loletha Carrow at 1:35 p.m. Electronically Signed   By: Abigail Miyamoto M.D.   On: 08/29/2016 13:36   Ir Int Lianne Cure Biliary Drain With Cholangiogram  Result Date: 09/06/2016 INDICATION: History of metastatic breast cancer, now with malignant obstructive jaundice. Request made for placement of percutaneous biliary drainage catheter in hopes of optimizing liver function tests to improve patient's candidacy for potential Y 90 or additional systemic therapies. EXAM: ULTRASOUND AND FLUOROSCOPIC GUIDED PERCUTANEOUS TRANSHEPATIC CHOLANGIOGRAM AND BILIARY TUBE PLACEMENT COMPARISON:  CT abdomen pelvis - 09/05/2016; abdominal MRI - 08/29/2016 MEDICATIONS: Zosyn 3.375 g IV ; the antibiotic was administered with an appropriate time frame prior to the initiation of the procedure. CONTRAST:  18mL ISOVUE-300 IOPAMIDOL (ISOVUE-300) INJECTION 61% - administered into the biliary tree ANESTHESIA/SEDATION: Moderate (conscious) sedation was employed during this procedure. A total of Versed 5 mg and Fentanyl 250 mcg was administered intravenously. Moderate Sedation Time: 57 minutes. The patient's level of consciousness and vital signs were monitored continuously by radiology nursing throughout the procedure under my direct supervision. FLUOROSCOPY TIME:  10 minutes 54 seconds (403 mGy) COMPLICATIONS: None immediate. TECHNIQUE: Informed written consent was obtained from the patient after a discussion of the risks, benefits and alternatives to treatment. Questions regarding the  procedure were encouraged and answered. A timeout was performed prior to the initiation of the procedure. The midline of the upper abdominal quadrant was prepped and draped in the usual sterile fashion, and a sterile drape was applied covering the  operative field. Maximum barrier sterile technique with sterile gowns and gloves were used for the procedure. A timeout was performed prior to the initiation of the procedure. Under direct ultrasound guidance, a dilated duct within the peripheral aspect of the left lobe of the liver was accessed with a [place] needle. Contrast injection confirmed appropriate positioning. A Nitrex wire was advanced to the level of the malignant obstruction at the level the biliary hilum. Despite prolonged efforts, the inner 3 French catheter from the [place] set could not be advanced into the biliary tree given the cranial location of the left lobe of the liver as well as the angulation of the accessed duct. As such, the more peripheral aspect of the dominant dilated duct within the left lobe of the liver was accessed with an additional [place] needle. A Nitrex wire was advanced to the level of the malignant obstruction at the level of the biliary hilum. Again, there is difficulty advancing the inner 3 French catheter from the [place] set and as such, a stiff micro puncture sheath sheath was utilized to secure the access in to biliary tree and allowed for placement of an Amplatz wire which was coiled within the biliary hilum. Under intermittent fluoroscopic guidance, the micropuncture sheath was exchanged for a a 4 French angled glide catheter and ultimately, a Kumpe catheter, which was utilized to manipulate the Amplatz wire through the common bile duct to the level of the duodenum. Contrast injection confirmed appropriate positioning. The Kumpe catheter was utilized for measurement purposes. Additional sideholes were cut approximately 2 cm peripheral to the radiopaque marker of an 8 French percutaneous biliary drainage catheter. Under intermittent fluoroscopic guidance, an 8 French percutaneous biliary drainage catheter was advanced over an Amplatz wire with tip ultimately coiled within the descending duodenum.  Contrast injection was performed demonstrating appropriate positioning and functionality of the biliary drainage catheter. Multiple spot fluoroscopic radiographic image were obtained in various obliquities. The catheter was connected to a drainage bag which yielded the brisk return of clear bile. The catheter was secured to the skin with an interrupted suture. The patient tolerated the procedure well without immediate postprocedural complication. FINDINGS: Ultrasound scanning demonstrates several hypoechoic lesions within the left lobe of liver compatible with known hepatic metastatic disease. Moderate intrahepatic biliary duct dilatation was again demonstrated within the left lobe of the liver Ultimately, under direct ultrasound guidance, a peripherally dilated duct in the left lobe of the liver was accessed allowing placement of a 8 French percutaneous biliary drainage catheter with and coiled and locked within the duodenum. Note, an 8 [place] percutaneous drain was placed in lieu of a 10 [place] as there was difficulty advancing the [place] set due to the cranial location of the left lobe of the liver and required angulation of the access bile duct. IMPRESSION: Successful placement of an 8 French percutaneous biliary drainage catheter with end coiled and locked within the duodenum. PLAN: - Patient will be followed with daily CMP levels in hopes of improvement in serum bilirubin levels. - Pending the trend of the patient's LFTs, the patient will likely return for repeat cholangiogram and potential biliary drainage catheter exchange and up sizing with potential capping later this week. - Ultimately, the patient's bilirubin will need to  improve 2 to be a candidate for Y 90 Radioembolization. Electronically Signed   By: Sandi Mariscal M.D.   On: 09/06/2016 11:15   Ir Exchange Biliary Drain  Result Date: 09/18/2016 INDICATION: Metastatic breast carcinoma with biliary obstruction, status post internal/external drain  catheter placement 09/06/2016. There was an initial improvement in the patient's bilirubin, which has since elevated above preprocedure level. EXAM: CHOLANGIOGRAM THROUGH EXISTING CATHETER EXCHANGE AND REVISION OF INTERNAL-EXTERNAL BILIARY DRAIN MEDICATIONS: Zosyn 3.375 g IV; The antibiotic was administered within an appropriate time frame prior to the initiation of the procedure. ANESTHESIA/SEDATION: Intravenous Fentanyl and Versed were administered as conscious sedation during continuous monitoring of the patient's level of consciousness and physiological / cardiorespiratory status by the radiology RN, with a total moderate sedation time of 15 minutes. PROCEDURE: Informed written consent was obtained from the patient after a thorough discussion of the procedural risks, benefits and alternatives. All questions were addressed. Maximal Sterile Barrier Technique was utilized including caps, mask, sterile gowns, sterile gloves, sterile drape, hand hygiene and skin antiseptic. A timeout was performed prior to the initiation of the procedure. Lidocaine 1% was administered subcutaneously around the catheter. Cholangiogram through the existing internal -external biliary drain catheter was performed. The catheter was cut and exchanged over a Bentson wire for a 5 [place] Kumpe catheter, advanced into the distal duodenum. This was then exchanged over an Amplatz wire for serial vascular dilators which facilitated advancement of a 12 [place] internal external biliary drain catheter, placed with side hole spanning the central left bile duct through the CBD into the duodenum. Catheter injection confirms appropriate positioning. The patient tolerated the procedure well. FLUOROSCOPY TIME:  2.2 minutes, 948  uGym2 DAP COMPLICATIONS: None immediate. FINDINGS: The initial cholangiogram demonstrated stable position of the previously placed 8 [place] internal external drain catheter with additional sideholes. The left biliary tree was  decompressed. The posterior right biliary tree was incompletely opacified but appear decompressed. The anterior right biliary tree was dilated centrally with focal high-grade stenosis near the confluence with the left duct. The catheter was exchanged and upsized to a 12 [place] internal -external drain catheter as above, without complication. IMPRESSION: 1. Continued patency of the previously placed biliary drain catheter with decompression of the left biliary system and a portion of the posterior right biliary tree. 2. Technically successful exchange and up-sizing to a 12 [place] internal external device, placed to external drainage. Electronically Signed   By: Lucrezia Europe M.D.   On: 09/18/2016 16:55   Ct Angio Abd/pel W/ And/or W/o  Result Date: 09/05/2016 CLINICAL DATA:  History of metastatic breast cancer with progressive hepatic metastatic disease. Please perform CTA for evaluation of candidacy for Y 90 radioembolization. EXAM: CTA ABDOMEN AND PELVIS WITH CONTRAST TECHNIQUE: Multidetector CT imaging of the abdomen and pelvis was performed using the standard protocol during bolus administration of intravenous contrast. Multiplanar reconstructed images and MIPs were obtained and reviewed to evaluate the vascular anatomy. CONTRAST:  100 cc Isovue 370 COMPARISON:  Abdominal MRI - 08/29/2016 ; 07/24/2016; PET-CT -05/14/2016 FINDINGS: VASCULAR Aorta: Minimal amount of atherosclerotic plaque within a normal caliber abdominal aorta, not resulting in hemodynamically significant stenosis. No abdominal aortic dissection or periaortic stranding. Celiac: Widely patent. Conventional takeoff of the GDA. Note is made of separate origins of the left lateral hepatic segmental artery with middle hepatic artery which supplies the medial segment of the left lobe of the liver as well as a portion of the anterior segment of the right lobe of the liver.  Otherwise, conventional branching pattern. The right gastric artery is not  definitely identified. SMA: Widely patent.  Conventional branching pattern. Renals: Solitary bilaterally. The bilateral renal arteries are widely patent without hemodynamically significant narrowing. No vessel irregularity to suggest FMD. IMA: Widely patent. Inflow: The bilateral common, external and internal iliac artery is are tortuous lobe normal caliber and widely patent without hemodynamically significant stenosis. Proximal Outflow: The bilateral common, superficial and deep femoral arteries appear widely patent throughout their imaged course. Veins: The IVC and pelvic venous system appears widely patent. Review of the MIP images confirms the above findings. NON-VASCULAR Lower chest: Limited visualization of the lower thorax demonstrates consolidative opacities within the imaged left lower lobe with associated volume loss and trace left-sided pleural effusion. Known hypermetabolic nodule within the medial basilar aspect the left pleural space is grossly unchanged measuring 1.5 cm in diameter (image 8, series 5). No new focal airspace opacities. Normal heart size.  No pericardial effusion. Hepatobiliary: Normal hepatic contour, however loculated fluid/bile is noted about the subcapsular aspect of the right lobe of the liver (image 29, series 16), similar to recent abdominal MRI. Re- demonstrated extensive hepatic metastatic disease, similar to recently obtained abdominal MRI with dominant lesion within the dome of the right lobe of liver measuring approximately 2.6 x 2.6 cm (image 24, series 6 dominant lesion within the subcapsular aspect the right lobe of the liver measuring approximately 3.0 x 3.7 cm (image 42, series 6) and dominant lesion within the subcapsular aspect the right lobe of the liver measuring approximately 7.5 x 4.2 cm (image 66). Re- demonstrated intrahepatic biliary duct dilatation with occlusion of the biliary system at the level the hilum. The main portal vein is tapered/narrowed at the  level of the biliary hilum though needs patent. A laminated approximately 1.6 cm gallstone is noted within the fundus of the gallbladder (image 86, series 4) with associated minimal amount of pericholecystic stranding. Pancreas: Normal appearance of the pancreas Spleen: Normal appearance of the spleen Adrenals/Urinary Tract: There is symmetric enhancement and excretion of the bilateral kidneys. No definite renal stones this postcontrast examination. No discrete renal lesions. No urine obstruction or perinephric stranding. Normal appearance the bilateral adrenal glands. Normal appearance of the urinary bladder given degree distention. Stomach/Bowel: Scattered colonic diverticulosis without evidence of diverticulitis. Normal appearance of the terminal ileum. The appendix is not visualized, however there is no pericecal inflammatory change. No pneumoperitoneum, pneumatosis or portal venous gas. Lymphatic: Compared to PET-CT performed 05/14/2016, there has been development of porta hepatis and retroperitoneal lymphadenopathy with index left-sided periaortic lymph node measuring 1.3 cm in greatest short axis diameter (image 70, series 4), index aortocaval lymph nodes measuring 1.2 cm (image 79) and 1.1 cm (image 100) and index gastrohepatic lymph ligament lymph node measuring 0.9 cm (image 46, series 4 Reproductive: Normal appearance of the pelvic organs. Small amount of free fluid in the pelvic cul-de-sac. Other: Regional soft tissues appear normal. Musculoskeletal: Moderate scoliotic curvature of the thoracolumbar spine with dominant caudal component convex to the left measuring approximately 30 degrees (as measured from the superior endplate of P29 to the inferior endplate of L4. Similar appearance of known pathologic fracture involving the right inferior pubic ramus (image 205, series 4). Approximately 1.3 x 1.5 cm sclerotic lesion within the right-side of the T11 vertebral body is unchanged (image 15, series 16).  Potential new approximately 1.8 x 1.7 cm lytic lesion involving the posterior aspects of the L3 vertebral body (sagittal image 115, series 9). IMPRESSION: VASCULAR 1.  Minimal amount of atherosclerotic plaque with a normal caliber abdominal aorta. Aortic Atherosclerosis (ICD10-I70.0). 2. Conventional anatomy amenable to Y 90 radioembolization. NON-VASCULAR 1. Compared to recent obtain abdominal MRI, unchanged extensive hepatic metastatic disease, primarily affecting the right lobe of the liver with obstruction of the biliary system at the level the hilum with associated intrahepatic biliary duct dilatation affecting both the right and left lobes of the liver. 2. Compared to PET-CT performed 05/14/2016, interval development of porta hepatis and retroperitoneal lymphadenopathy as detailed above. 3. Grossly unchanged metastatic disease within the imaged left lower lung / pleural as detailed above. 4. Potential new approximately 1.8 cm lytic lesion involving the posterior aspect of the L3 vertebral body. Further evaluation lumbar spine MRI could performed as clinically indicated. Electronically Signed   By: Sandi Mariscal M.D.   On: 09/05/2016 16:57    Discharge Laboratory Values: CMP Latest Ref Rng & Units 09/21/2016 09/20/2016 09/19/2016  Glucose 65 - 99 mg/dL 102(H) 98 97  BUN 6 - 20 mg/dL 13 13 13   Creatinine 0.44 - 1.00 mg/dL 0.59 0.74 0.73  Sodium 135 - 145 mmol/L 122(L) 120(L) 124(L)  Potassium 3.5 - 5.1 mmol/L 4.2 4.2 4.6  Chloride 101 - 111 mmol/L 88(L) 86(L) 89(L)  CO2 22 - 32 mmol/L 25 25 25   Calcium 8.9 - 10.3 mg/dL 8.2(L) 8.2(L) 8.2(L)  Total Protein 6.5 - 8.1 g/dL 4.8(L) 4.9(L) 4.8(L)  Total Bilirubin 0.3 - 1.2 mg/dL 21.9(HH) 21.4(HH) 20.3(HH)  Alkaline Phos 38 - 126 U/L 643(H) 609(H) 551(H)  AST 15 - 41 U/L 294(H) 330(H) 291(H)  ALT 14 - 54 U/L 188(H) 206(H) 200(H)    CBC    Component Value Date/Time   WBC 9.1 09/21/2016 0414   RBC 2.42 (L) 09/21/2016 0414   HGB 8.3 (L) 09/21/2016 0414    HGB 8.9 (L) 09/15/2016 1131   HCT 22.3 (L) 09/21/2016 0414   HCT 25.2 (L) 09/15/2016 1131   PLT 354 09/21/2016 0414   PLT 428 (H) 09/15/2016 1131   MCV 92.1 09/21/2016 0414   MCV 96.6 09/15/2016 1131   MCH 34.3 (H) 09/21/2016 0414   MCHC 37.2 (H) 09/21/2016 0414   RDW 17.0 (H) 09/21/2016 0414   RDW 16.9 (H) 09/15/2016 1131   LYMPHSABS 0.3 (L) 09/21/2016 0414   LYMPHSABS 0.5 (L) 09/15/2016 1131   MONOABS 0.8 09/21/2016 0414   MONOABS 1.0 (H) 09/15/2016 1131   EOSABS 0.2 09/21/2016 0414   EOSABS 0.1 09/15/2016 1131   EOSABS 0.2 11/02/2007 1116   BASOSABS 0.0 09/21/2016 0414   BASOSABS 0.1 09/15/2016 1131     Brief H and P: For complete details please refer to admission H and P, but in brief, this was an observation admission on this patient with stage IV breast cancer who underwent biliary drainage tube exchange on 09/19/2016. For further details see the hospital course  Physical Exam at Discharge: BP (!) 154/91 (BP Location: Left Leg)   Pulse 87   Temp 97.7 F (36.5 C) (Oral)   Resp 20   Ht 5\' 4"  (1.626 m)   Wt 176 lb (79.8 kg)   SpO2 100%   BMI 30.21 kg/m  Gen: Pain controlled on current medications Cardiovascular: Regular rate and rhythm Pulmonary: No rales or wheezes Gastrointestinal: Abdomen firm, mildly tender, biliary drainage in place, bandage clean and dry Extremities: Bilateral lower extremity lymphedema as previously noted    Hospital Course:  The patient was admitted for observation following her biliary tube exchange. She was very uncomfortable,  had uncontrolled pain, some nausea, no vomiting, and was unable to hydrate herself properly. Over the next 48 hours the patient's pain was stabilized. Her diet was broadened since she was moderately well-nourished. She had been on fluid restriction because of her hyponatremia but this was relaxed. Labs were rechecked and unfortunately the total bilirubin had not gone down by the time of discharge suggesting  significant residual obstruction despite the drainage exchange.--Advanced directives were in place at the time of admission. The patient is clear that she does not want to be resuscitated in case of a terminal event. She understands we do not have any life prolonging treatment at this point that is not likely to hurt her more than help her, and therefore, given her limited prognosis, she qualifies for hospice referral. The patient and her husband were agreeable and this is being placed. She will need significant help from home health nursing at home in terms of flushing the patient's PICC line and the patient's biliary drainage catheter. This also was arranged prior to discharge.   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 Lurline Del [phone]  09/23/2016, 8:50 AM

## 2016-09-24 ENCOUNTER — Ambulatory Visit: Payer: 59

## 2016-09-26 ENCOUNTER — Other Ambulatory Visit: Payer: 59

## 2016-09-28 NOTE — Progress Notes (Signed)
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 Most recently we have placed a hospice referral and has switched [name] from advanced home care, which had been very irritating to [facility], to hospice. This seemed to work better initially but discussions of [name] fact that she would not be receiving life-prolonging treatment were sufficiently disturbing to [name] that she has fired hospice. She says that she is hoping for a miracle mouth expecting [name] drain to work, [name] bilirubin to drop, and hoping that that might make [name] a candidate for some further form of therapy that she could possibly tolerate.   REVIEW OF SYiSTEMS: 2 things that are not happening are uncontrolled pain and uncontrolled pruritus. She has no pruritus at all, which is remarkable. As far as back pain is concerned all she is using his Toradol. She weaned herself off [name] tramadol which she felt was not working. [name] drain was doing poorly but for [name] last 2 days she has had more than 400 mL drained per day. [name] fluid iscaramel colored. It is turbid. [name] PICC line is working well. She is having regular BMs. She is making herself eat. She tells me that she tolerates oatmeal well. She has put herself on a low-salt rather than a no salt diet. As far as fluid is concerned she thinks she is drinking 8-10 glasses a day. She denies unusual headaches, visual changes, or confusion. A detailed review of systems today was otherwise  unchanged  BREAST CANCER HISTORY: From doctor [name]'s intake note 03/20/2004:  "[name] [name] is a very pleasant 56 year old female, without significant past medical history.  [name] family history is significant for a sister who at age 65 was diagnosed with invasive ductal carcinoma.  She is a breast cancer survivor at age 48 now.  [name] [name] states that she has never really had a screening mammogram until October 2005, when she felt that it was time for [name] to start having mammograms done on a yearly basis.  Therefore, on 01/26/04, she underwent a screening mammogram and an abnormality was detected in [name] upper outer right breast.  She, therefore, underwent spot compression views of both [name] right and [name] left breast.  [name] left breast revealed a well-defined mass in [name] upper outer left quadrant, present at [name] 2 o'clock position, measuring 1.8 cm, 6 cm from [name] nipple.  This, by ultrasound, was felt to be a simple cyst measuring 1.8 cm.  On [name] right breast, a spiculated mass was noted in [name] upper outer right quadrant.  [name] ultrasound revealed a shadowing irregular solid mass at [name] 10:30 position, 9 cm from [name] nipple, measuring 1.2 cm in greatest dimension, correlating with [name] spiculated mass seen on [name] mammogram.  [name] right axilla was negative ultrasonically.  Because of this, [name] [name] underwent a needle biopsy of [name] right breast and [name] biopsy was positive invasive mammary carcinoma that showed features consistent with a high-grade invasive ductal carcinoma associated with desmoplastic stroma.  No in situ component was  seen and no definite lymphovascular invasion was identified.  On [name] core biopsy, [name] tumor measured about 0.8 cm.  Because of this, she was seen by Dr. [name] and [name] [name] was taken to [name] on March 15, 2004.  She underwent a right breast lumpectomy with sentinel node biopsy.  [name] final pathology revealed an invasive ductal carcinoma, measuring 1.7 cm, grade  2 of 3.  Margins were free of tumor.  Atypical lobular hyperplasia was noted.  One sentinel node was removed which was negative for metastatic disease.  [name] tumor was staged at T1c, N0 MX.  It was estrogen receptor positive, progesterone receptor positive.  [name]-2/neu was 2+.  FISH was negative.  All margins were free of tumor.  She is now seen in Medical Oncology for further evaluation and management of this newly diagnosed T1c, node negative, stage I, invasive ductal carcinoma of [name] right breast."  [name] subsequent history is as detailed below   PAST MEDICAL HISTORY: Past Medical History:  Diagnosis Date  . Bone metastases ([place]) dx'd 05/2014  . Breast cancer ([facility]) dx'd 2005/2011  . Peripheral vascular disease ([facility]) 02/2010   blood clot related to porta cath  . PONV (postoperative nausea and vomiting)   . S/P radiation therapy 07/17/2014 through 08/02/2014    Left mediastinum, left seventh rib 3250 cGy in 13 sessions   . S/P radiation therapy 12/11/2014 through 12/22/2014    Left parietal calvarium 2400 cGy in 8 sessions   . Seizures ([facility]) 2010   Isolated incident.    PAST SURGICAL HISTORY: Past Surgical History:  Procedure Laterality Date  . AXILLARY LYMPH NODE DISSECTION  Dec. 2011  . BREAST LUMPECTOMY  2005  . IR EXCHANGE BILIARY DRAIN  09/18/2016  . IR FLUORO GUIDE CV LINE LEFT  09/05/2016  . IR GENERIC HISTORICAL  05/21/2016   IR RADIOLOGIST EVAL & MGMT 05/21/2016 Sandi Mariscal, MD [facility] INTERV RAD  . IR INT EXT BILIARY DRAIN WITH CHOLANGIOGRAM  09/06/2016  . IR US GUIDE VASC ACCESS LEFT  09/05/2016  . MEDIASTINOTOMY CHAMBERLAIN MCNEIL Left 06/02/2013   Procedure: MEDIASTINOTOMY CHAMBERLAIN MCNEIL;  Surgeon: [name], MD;  Location: [facility];  Service: Thoracic;  Laterality: Left;  LEFT ANTERIOR MEDIASTINOTOMY   . PORTACATH  PLACEMENT  12/11  . removal portacath      FAMILY HISTORY Family History  Problem Relation Age of Onset  . COPD Mother   . Breast cancer Sister 70   [name] [name]'s [name] is living, 57 years old as of may 2015. He lives in [place]. [name] [name]'s mother died from complications of COPD at [name] age of 62. These has 2 brothers, one sister. [name] sister developed breast cancer at [name] age of 10. She is doing well. [name] [name] herself underwent genetic testing at [facility] in 2011 and was found to be BRCA negative  GYNECOLOGIC HISTORY:  Menarche age 46, she is GX P0. She stopped having periods with [name] initial chemotherapy in 2006.  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 [name] husband [name] [name] is a Medical illustrator in [place]. He has a child from a prior marriage. At home they have 2 rescue dogs, Hobo and [place]. [name] [name] is religious but not a church attender    ADVANCED DIRECTIVES: In place; at [name] 08/04/2014 visit in particular [name] [name] was very clear, with [name] husband present, that she would not want any kind of  feeding tubes or "other tubes" if [name] condition deteriorated.   HEALTH MAINTENANCE: Social History  Substance Use Topics  . Smoking status: Never Smoker  . Smokeless tobacco: Never Used  . Alcohol use No     Colonoscopy:  PAP:  Bone density: March 2015; mild osteopenia  Lipid panel:  Allergies  Allergen Reactions  . 2nd Skin Quick Heal Other (See Comments)    Other Reaction: Skin peels  . Decadron [Dexamethasone] Other (See Comments)    [name] does not tolerate steroids.   . Dilaudid [Hydromorphone] Nausea And Vomiting  . Enoxaparin Other (See Comments)    unknown  . Fluconazole Swelling    Liver toxicity  . Hydromorphone Hcl Nausea And Vomiting  . Morphine And Related Nausea And Vomiting  . Ondansetron Other (See Comments)    "makes me loopy"  . Protonix [Pantoprazole Sodium] Other (See  Comments)    [name] reports it caused thrush.  . Tegaderm Ag Mesh [Silver]     Current Outpatient Prescriptions  Medication Sig Dispense Refill  . alum & mag hydroxide-simeth (MAALOX/MYLANTA) 200-200-20 MG/5ML suspension Take 30 mLs by mouth every 6 (six) hours as needed for indigestion, heartburn or flatulence (Bloating,gas pain). 355 mL 0  . B Complex-C (B-COMPLEX WITH VITAMIN C) tablet Take 1 tablet by mouth daily. Reported on 03/27/2015    . betamethasone valerate ointment (VALISONE) 0.1 % Apply 1 application topically 2 (two) times daily. 30 g 0  . calcium-vitamin D (OSCAL WITH D) 500-200 MG-UNIT tablet Take 2 tablets by mouth 2 (two) times daily. 120 tablet 6  . cholecalciferol 2000 UNITS tablet Take 1 tablet (2,000 Units total) by mouth daily.    Marland Kitchen docusate sodium (COLACE) 100 MG capsule Take 1 capsule (100 mg total) by mouth 2 (two) times daily. 10 capsule 0  . famotidine (PEPCID) 40 MG tablet Take 0.5-1 tablets (20-40 mg total) by mouth 2 (two) times daily. 40 mg in [name] morning and 20 mg in [name] evening ([name] taking differently: Take 40 mg by mouth 2 (two) times daily. ) 30 tablet 1  . famotidine (PEPCID) 40 MG tablet Take 1 tablet (40 mg total) by mouth 2 (two) times daily. 60 tablet 3  . fentaNYL (ACTIQ) 200 MCG lollipop Place 200 mcg inside cheek every 4 (four) hours as needed for pain. 30 tablet 0  . folic acid (FOLVITE) 1 MG tablet Take 1 tablet (1 mg total) by mouth daily. 90 tablet 2  . ketorolac (TORADOL) 10 MG tablet Take 1 tablet (10 mg total) by mouth every 6 (six) hours as needed. ([name] taking differently: Take 10 mg by mouth every 6 (six) hours. ) 120 tablet 6  . LORazepam (ATIVAN) 0.5 MG tablet Take 1 tablet (0.5 mg total) by mouth every 8 (eight) hours as needed for anxiety. 30 tablet 0  . Melatonin 3 MG TABS Take 3 mg by mouth at bedtime.    . polyethylene glycol (MIRALAX / GLYCOLAX) packet Take 17 g by mouth daily. 14 each 0  . promethazine (PHENERGAN) 12.5 MG  tablet Take 1 tablet (12.5 mg total) by mouth every 6 (six) hours as needed for nausea. ([name] not taking: Reported on 09/18/2016) 30 tablet 0  . saccharomyces boulardii (FLORASTOR) 250 MG capsule Take 250 mg by mouth daily.     . traMADol (ULTRAM) 50 MG tablet Take 1-2 tablets (50-100 mg total) by mouth every 6 (six) hours as needed. ([name] taking differently: Take 50-100 mg by mouth every 6 (six) hours. )  120 tablet 0  . triamterene-hydrochlorothiazide (DYAZIDE) 37.5-25 MG capsule Take 1 each (1 capsule total) by mouth daily as needed (weight gain). 30 capsule 6   No current facility-administered medications for this visit.     OBJECTIVE: Middle-aged white womanExamined in a wheelchair  There were no vitals filed for this visit.   There is no height or weight on file to calculate BMI.   [name] refused vitals 09/28/16    ECOG FS: 2  Sclerae icteric, EOMs intact Oropharynx clear and moist No cervical or supraclavicular adenopathy Lungs no rales or rhonchi Heart regular rate and rhythm Abd soft, nontender, biliary drainage bandage clean and dry Neuro: nonfocal, well oriented, anxious affect Breasts: Deferred   LAB RESULTS:   CMP     Component Value Date/Time   NA 122 (L) 09/23/2016 1141   K 3.9 09/23/2016 1141   CL 88 (L) 09/21/2016 0414   CL 105 05/06/2012 1333   CO2 23 09/23/2016 1141   GLUCOSE 93 09/23/2016 1141   GLUCOSE 124 (H) 05/06/2012 1333   BUN 13.5 09/23/2016 1141   CREATININE 0.8 09/23/2016 1141   CALCIUM 8.0 (L) 09/23/2016 1141   PROT 4.4 (L) 09/23/2016 1141   ALBUMIN 1.6 (L) 09/23/2016 1141   AST 200 (HH) 09/23/2016 1141   ALT 150 (H) 09/23/2016 1141   ALKPHOS 699 (H) 09/23/2016 1141   BILITOT 21.10 Result Confirmed by Automated Dilution. (HH) 09/23/2016 1141   GFRNONAA >60 09/21/2016 0414   GFRAA >60 09/21/2016 0414    No results found for: SPEP  Lab Results  Component Value Date   WBC 17.9 (H) 09/23/2016   NEUTROABS 16.0 (H) 09/23/2016   HGB  8.3 (L) 09/23/2016   HCT 23.2 (L) 09/23/2016   MCV 95.5 09/23/2016   PLT 377 09/23/2016      Chemistry      Component Value Date/Time   NA 122 (L) 09/23/2016 1141   K 3.9 09/23/2016 1141   CL 88 (L) 09/21/2016 0414   CL 105 05/06/2012 1333   CO2 23 09/23/2016 1141   BUN 13.5 09/23/2016 1141   CREATININE 0.8 09/23/2016 1141      Component Value Date/Time   CALCIUM 8.0 (L) 09/23/2016 1141   ALKPHOS 699 (H) 09/23/2016 1141   AST 200 (HH) 09/23/2016 1141   ALT 150 (H) 09/23/2016 1141   BILITOT 21.10 Result Confirmed by Automated Dilution. ([place]) 09/23/2016 1141     No results for input(s): INR in [name] last 168 hours.  Urinalysis    Component Value Date/Time   COLORURINE AMBER (A) 08/26/2016 0334   APPEARANCEUR CLEAR 08/26/2016 0334   LABSPEC 1.015 08/26/2016 0334   LABSPEC 1.005 05/16/2016 1201   PHURINE 5.0 08/26/2016 0334   GLUCOSEU NEGATIVE 08/26/2016 0334   GLUCOSEU Negative 05/16/2016 1201   HGBUR SMALL (A) 08/26/2016 0334   BILIRUBINUR MODERATE (A) 08/26/2016 0334   BILIRUBINUR Negative 05/16/2016 1201   KETONESUR 20 (A) 08/26/2016 0334   PROTEINUR NEGATIVE 08/26/2016 0334   UROBILINOGEN 0.2 05/16/2016 1201   NITRITE NEGATIVE 08/26/2016 0334   LEUKOCYTESUR NEGATIVE 08/26/2016 0334   LEUKOCYTESUR Small 05/16/2016 1201     STUDIES: Ir Fluoro Guide Cv Line Left  Result Date: 09/05/2016 INDICATION: Metastatic breast cancer to liver, poor venous access; request made for central venous access . EXAM: LEFT UPPER EXTREMITY PICC LINE PLACEMENT WITH ULTRASOUND AND FLUOROSCOPIC GUIDANCE MEDICATIONS: None ANESTHESIA/SEDATION: None FLUOROSCOPY TIME:  Fluoroscopy Time:  42 seconds COMPLICATIONS: None immediate. PROCEDURE: [name] [name] was advised of  [name] possible risks and complications and agreed to undergo [name] procedure. [name] [name] was then brought to [name] angiographic suite for [name] procedure. [name] left arm was prepped with chlorhexidine, draped in [name] usual sterile fashion using  maximum barrier technique (cap and mask, sterile gown, sterile gloves, large sterile sheet, hand hygiene and cutaneous antisepsis) and infiltrated locally with 1% Lidocaine. Ultrasound demonstrated patency of [name] left brachial vein, and this was documented with an image. Under real-time ultrasound guidance, this vein was accessed with a 21 gauge micropuncture needle and image documentation was performed. A 0.018 wire was introduced in to [name] vein. Over this, a 5 [place] double lumen power injectable PICC was advanced to [name] lower SVC/right atrial junction. Fluoroscopy during [name] procedure and fluoro spot radiograph confirms appropriate catheter position. [name] catheter was flushed and covered with a sterile dressing. Catheter length:  41 cm IMPRESSION: Successful left arm power PICC line placement with ultrasound and fluoroscopic guidance. [name] catheter is ready for use. Read by: Rowe Robert, PA-C Electronically Signed   By: Markus Daft M.D.   On: 09/05/2016 16:00   Ir US Guide Vasc Access Left  Result Date: 09/05/2016 INDICATION: Metastatic breast cancer to liver, poor venous access; request made for central venous access . EXAM: LEFT UPPER EXTREMITY PICC LINE PLACEMENT WITH ULTRASOUND AND FLUOROSCOPIC GUIDANCE MEDICATIONS: None ANESTHESIA/SEDATION: None FLUOROSCOPY TIME:  Fluoroscopy Time:  42 seconds COMPLICATIONS: None immediate. PROCEDURE: [name] [name] was advised of [name] possible risks and complications and agreed to undergo [name] procedure. [name] [name] was then brought to [name] angiographic suite for [name] procedure. [name] left arm was prepped with chlorhexidine, draped in [name] usual sterile fashion using maximum barrier technique (cap and mask, sterile gown, sterile gloves, large sterile sheet, hand hygiene and cutaneous antisepsis) and infiltrated locally with 1% Lidocaine. Ultrasound demonstrated patency of [name] left brachial vein, and this was documented with an image. Under real-time ultrasound guidance, this vein  was accessed with a 21 gauge micropuncture needle and image documentation was performed. A 0.018 wire was introduced in to [name] vein. Over this, a 5 [place] double lumen power injectable PICC was advanced to [name] lower SVC/right atrial junction. Fluoroscopy during [name] procedure and fluoro spot radiograph confirms appropriate catheter position. [name] catheter was flushed and covered with a sterile dressing. Catheter length:  41 cm IMPRESSION: Successful left arm power PICC line placement with ultrasound and fluoroscopic guidance. [name] catheter is ready for use. Read by: Rowe Robert, PA-C Electronically Signed   By: Markus Daft M.D.   On: 09/05/2016 16:00   Ir Int Lianne Cure Biliary Drain With Cholangiogram  Result Date: 09/06/2016 INDICATION: History of metastatic breast cancer, now with malignant obstructive jaundice. Request made for placement of percutaneous biliary drainage catheter in hopes of optimizing liver function tests to improve [name]'s candidacy for potential Y 90 or additional systemic therapies. EXAM: ULTRASOUND AND FLUOROSCOPIC GUIDED PERCUTANEOUS TRANSHEPATIC CHOLANGIOGRAM AND BILIARY TUBE PLACEMENT COMPARISON:  CT abdomen pelvis - 09/05/2016; abdominal MRI - 08/29/2016 MEDICATIONS: Zosyn 3.375 g IV ; [name] antibiotic was administered with an appropriate time frame prior to [name] initiation of [name] procedure. CONTRAST:  77m ISOVUE-300 IOPAMIDOL (ISOVUE-300) INJECTION 61% - administered into [name] biliary tree ANESTHESIA/SEDATION: Moderate (conscious) sedation was employed during this procedure. A total of Versed 5 mg and Fentanyl 250 mcg was administered intravenously. Moderate Sedation Time: 57 minutes. [name] [name]'s level of consciousness and vital signs were monitored continuously by radiology nursing throughout [name] procedure under my direct supervision. FLUOROSCOPY TIME:  10  minutes 54 seconds (349 mGy) COMPLICATIONS: None immediate. TECHNIQUE: Informed written consent was obtained from [name] [name] after a  discussion of [name] risks, benefits and alternatives to treatment. Questions regarding [name] procedure were encouraged and answered. A timeout was performed prior to [name] initiation of [name] procedure. [name] midline of [name] upper abdominal quadrant was prepped and draped in [name] usual sterile fashion, and a sterile drape was applied covering [name] operative field. Maximum barrier sterile technique with sterile gowns and gloves were used for [name] procedure. A timeout was performed prior to [name] initiation of [name] procedure. Under direct ultrasound guidance, a dilated duct within [name] peripheral aspect of [name] left lobe of [name] liver was accessed with a [place] needle. Contrast injection confirmed appropriate positioning. A Nitrex wire was advanced to [name] level of [name] malignant obstruction at [name] level [name] biliary hilum. Despite prolonged efforts, [name] inner 3 French catheter from [name] [place] set could not be advanced into [name] biliary tree given [name] cranial location of [name] left lobe of [name] liver as well as [name] angulation of [name] accessed duct. As such, [name] more peripheral aspect of [name] dominant dilated duct within [name] left lobe of [name] liver was accessed with an additional [place] needle. A Nitrex wire was advanced to [name] level of [name] malignant obstruction at [name] level of [name] biliary hilum. Again, there is difficulty advancing [name] inner 3 French catheter from [name] Accustick set and as such, a stiff micro puncture sheath sheath was utilized to secure [name] access in to biliary tree and allowed for placement of an Amplatz wire which was coiled within [name] biliary hilum. Under intermittent fluoroscopic guidance, [name] micropuncture sheath was exchanged for a a 4 French angled glide catheter and ultimately, a Kumpe catheter, which was utilized to manipulate [name] Amplatz wire through [name] common bile duct to [name] level of [name] duodenum. Contrast injection confirmed appropriate positioning. [name] Kumpe catheter was utilized for measurement  purposes. Additional sideholes were cut approximately 2 cm peripheral to [name] radiopaque marker of an 8 French percutaneous biliary drainage catheter. Under intermittent fluoroscopic guidance, an 8 French percutaneous biliary drainage catheter was advanced over an Amplatz wire with tip ultimately coiled within [name] descending duodenum. Contrast injection was performed demonstrating appropriate positioning and functionality of [name] biliary drainage catheter. Multiple spot fluoroscopic radiographic image were obtained in various obliquities. [name] catheter was connected to a drainage bag which yielded [name] brisk return of clear bile. [name] catheter was secured to [name] skin with an interrupted suture. [name] [name] tolerated [name] procedure well without immediate postprocedural complication. FINDINGS: Ultrasound scanning demonstrates several hypoechoic lesions within [name] left lobe of liver compatible with known hepatic metastatic disease. Moderate intrahepatic biliary duct dilatation was again demonstrated within [name] left lobe of [name] liver Ultimately, under direct ultrasound guidance, a peripherally dilated duct in [name] left lobe of [name] liver was accessed allowing placement of a 8 French percutaneous biliary drainage catheter with and coiled and locked within [name] duodenum. Note, an 8 [place] percutaneous drain was placed in lieu of a 10 [place] as there was difficulty advancing [name] Accustick set due to [name] cranial location of [name] left lobe of [name] liver and required angulation of [name] access bile duct. IMPRESSION: Successful placement of an 8 French percutaneous biliary drainage catheter with end coiled and locked within [name] duodenum. PLAN: - [name] will be followed with daily CMP levels in hopes of improvement in serum bilirubin levels. - Pending [name] trend of [name] [name]'s LFTs,  [name] [name] will likely return for repeat cholangiogram and potential biliary drainage catheter exchange and up sizing with potential capping later this week.  - Ultimately, [name] [name]'s bilirubin will need to improve 2 to be a candidate for Y 90 Radioembolization. Electronically Signed   By: Sandi Mariscal M.D.   On: 09/06/2016 11:15   Ir Exchange Biliary Drain  Result Date: 09/18/2016 INDICATION: Metastatic breast carcinoma with biliary obstruction, status post internal/external drain catheter placement 09/06/2016. There was an initial improvement in [name] [name]'s bilirubin, which has since elevated above preprocedure level. EXAM: CHOLANGIOGRAM THROUGH EXISTING CATHETER EXCHANGE AND REVISION OF INTERNAL-EXTERNAL BILIARY DRAIN MEDICATIONS: Zosyn 3.375 g IV; [name] antibiotic was administered within an appropriate time frame prior to [name] initiation of [name] procedure. ANESTHESIA/SEDATION: Intravenous Fentanyl and Versed were administered as conscious sedation during continuous monitoring of [name] [name]'s level of consciousness and physiological / cardiorespiratory status by [name] radiology RN, with a total moderate sedation time of 15 minutes. PROCEDURE: Informed written consent was obtained from [name] [name] after a thorough discussion of [name] procedural risks, benefits and alternatives. All questions were addressed. Maximal Sterile Barrier Technique was utilized including caps, mask, sterile gowns, sterile gloves, sterile drape, hand hygiene and skin antiseptic. A timeout was performed prior to [name] initiation of [name] procedure. Lidocaine 1% was administered subcutaneously around [name] catheter. Cholangiogram through [name] existing internal -external biliary drain catheter was performed. [name] catheter was cut and exchanged over a [place] wire for a 5 [place] Kumpe catheter, advanced into [name] distal duodenum. This was then exchanged over an Amplatz wire for serial vascular dilators which facilitated advancement of a 12 [place] internal external biliary drain catheter, placed with side hole spanning [name] central left bile duct through [name] CBD into [name] duodenum. Catheter injection confirms  appropriate positioning. [name] [name] tolerated [name] procedure well. FLUOROSCOPY TIME:  2.2 minutes, 528  uGym2 DAP COMPLICATIONS: None immediate. FINDINGS: [name] initial cholangiogram demonstrated stable position of [name] previously placed 8 [place] internal external drain catheter with additional sideholes. [name] left biliary tree was decompressed. [name] posterior right biliary tree was incompletely opacified but appear decompressed. [name] anterior right biliary tree was dilated centrally with focal high-grade stenosis near [name] confluence with [name] left duct. [name] catheter was exchanged and upsized to a 12 [place] internal -external drain catheter as above, without complication. IMPRESSION: 1. Continued patency of [name] previously placed biliary drain catheter with decompression of [name] left biliary system and a portion of [name] posterior right biliary tree. 2. Technically successful exchange and up-sizing to a 12 [place] internal external device, placed to external drainage. Electronically Signed   By: Lucrezia Europe M.D.   On: 09/18/2016 16:55   Ct Angio Abd/pel W/ And/or W/o  Result Date: 09/05/2016 CLINICAL DATA:  History of metastatic breast cancer with progressive hepatic metastatic disease. Please perform CTA for evaluation of candidacy for Y 90 radioembolization. EXAM: CTA ABDOMEN AND PELVIS WITH CONTRAST TECHNIQUE: Multidetector CT imaging of [name] abdomen and pelvis was performed using [name] standard protocol during bolus administration of intravenous contrast. Multiplanar reconstructed images and MIPs were obtained and reviewed to evaluate [name] vascular anatomy. CONTRAST:  100 cc Isovue 370 COMPARISON:  Abdominal MRI - 08/29/2016 ; 07/24/2016; PET-CT -05/14/2016 FINDINGS: VASCULAR Aorta: Minimal amount of atherosclerotic plaque within a normal caliber abdominal aorta, not resulting in hemodynamically significant stenosis. No abdominal aortic dissection or periaortic stranding. Celiac: Widely patent. Conventional takeoff of [name]  GDA. Note is made of separate origins of [name] left lateral hepatic segmental artery  with middle hepatic artery which supplies [name] medial segment of [name] left lobe of [name] liver as well as a portion of [name] anterior segment of [name] right lobe of [name] liver. Otherwise, conventional branching pattern. [name] right gastric artery is not definitely identified. SMA: Widely patent.  Conventional branching pattern. Renals: Solitary bilaterally. [name] bilateral renal arteries are widely patent without hemodynamically significant narrowing. No vessel irregularity to suggest FMD. IMA: Widely patent. Inflow: [name] bilateral common, external and internal iliac artery is are tortuous lobe normal caliber and widely patent without hemodynamically significant stenosis. Proximal Outflow: [name] bilateral common, superficial and deep femoral arteries appear widely patent throughout their imaged course. Veins: [name] IVC and pelvic venous system appears widely patent. Review of [name] MIP images confirms [name] above findings. NON-VASCULAR Lower chest: Limited visualization of [name] lower thorax demonstrates consolidative opacities within [name] imaged left lower lobe with associated volume loss and trace left-sided pleural effusion. Known hypermetabolic nodule within [name] medial basilar aspect [name] left pleural space is grossly unchanged measuring 1.5 cm in diameter (image 8, series 5). No new focal airspace opacities. Normal heart size.  No pericardial effusion. Hepatobiliary: Normal hepatic contour, however loculated fluid/bile is noted about [name] subcapsular aspect of [name] right lobe of [name] liver (image 29, series 16), similar to recent abdominal MRI. Re- demonstrated extensive hepatic metastatic disease, similar to recently obtained abdominal MRI with dominant lesion within [name] dome of [name] right lobe of liver measuring approximately 2.6 x 2.6 cm (image 24, series 6 dominant lesion within [name] subcapsular aspect [name] right lobe of [name] liver measuring approximately  3.0 x 3.7 cm (image 42, series 6) and dominant lesion within [name] subcapsular aspect [name] right lobe of [name] liver measuring approximately 7.5 x 4.2 cm (image 66). Re- demonstrated intrahepatic biliary duct dilatation with occlusion of [name] biliary system at [name] level [name] hilum. [name] main portal vein is tapered/narrowed at [name] level of [name] biliary hilum though needs patent. A laminated approximately 1.6 cm gallstone is noted within [name] fundus of [name] gallbladder (image 86, series 4) with associated minimal amount of pericholecystic stranding. Pancreas: Normal appearance of [name] pancreas Spleen: Normal appearance of [name] spleen Adrenals/Urinary Tract: There is symmetric enhancement and excretion of [name] bilateral kidneys. No definite renal stones this postcontrast examination. No discrete renal lesions. No urine obstruction or perinephric stranding. Normal appearance [name] bilateral adrenal glands. Normal appearance of [name] urinary bladder given degree distention. Stomach/Bowel: Scattered colonic diverticulosis without evidence of diverticulitis. Normal appearance of [name] terminal ileum. [name] appendix is not visualized, however there is no pericecal inflammatory change. No pneumoperitoneum, pneumatosis or portal venous gas. Lymphatic: Compared to PET-CT performed 05/14/2016, there has been development of porta hepatis and retroperitoneal lymphadenopathy with index left-sided periaortic lymph node measuring 1.3 cm in greatest short axis diameter (image 70, series 4), index aortocaval lymph nodes measuring 1.2 cm (image 79) and 1.1 cm (image 100) and index gastrohepatic lymph ligament lymph node measuring 0.9 cm (image 46, series 4 Reproductive: Normal appearance of [name] pelvic organs. Small amount of free fluid in [name] pelvic cul-de-sac. Other: Regional soft tissues appear normal. Musculoskeletal: Moderate scoliotic curvature of [name] thoracolumbar spine with dominant caudal component convex to [name] left measuring approximately 30  degrees (as measured from [name] superior endplate of Q22 to [name] inferior endplate of L4. Similar appearance of known pathologic fracture involving [name] right inferior pubic ramus (image 205, series 4). Approximately 1.3 x 1.5 cm sclerotic lesion within [name] right-side of [name] T11 vertebral body is  unchanged (image 15, series 16). Potential new approximately 1.8 x 1.7 cm lytic lesion involving [name] posterior aspects of [name] L3 vertebral body (sagittal image 115, series 9). IMPRESSION: VASCULAR 1. Minimal amount of atherosclerotic plaque with a normal caliber abdominal aorta. Aortic Atherosclerosis (ICD10-I70.0). 2. Conventional anatomy amenable to Y 90 radioembolization. NON-VASCULAR 1. Compared to recent obtain abdominal MRI, unchanged extensive hepatic metastatic disease, primarily affecting [name] right lobe of [name] liver with obstruction of [name] biliary system at [name] level [name] hilum with associated intrahepatic biliary duct dilatation affecting both [name] right and left lobes of [name] liver. 2. Compared to PET-CT performed 05/14/2016, interval development of porta hepatis and retroperitoneal lymphadenopathy as detailed above. 3. Grossly unchanged metastatic disease within [name] imaged left lower lung / pleural as detailed above. 4. Potential new approximately 1.8 cm lytic lesion involving [name] posterior aspect of [name] L3 vertebral body. Further evaluation lumbar spine MRI could performed as clinically indicated. Electronically Signed   By: Sandi Mariscal M.D.   On: 09/05/2016 16:57    ASSESSMENT: 56 y.o. BRCA negative [name] [name] woman with stage IV breast cancer, history as follows  (1)  S/p Right upper inner quadrant lumpectomy and sentinel lymph node sampling 03/15/2004 for a pT1c pN0. Stage IA invasive ductal carcinoma, grade 2, estrogen receptor 95% positive, progesterone receptor 65% positive, [name]-2 not amplified; additional surgery 04/25/2004 for seroma or clearance showed no residual tumor  (2) adjuvant chemotherapy with  cyclophosphamide and doxorubicin every 21 days x4 completed 07/19/2004  (3) adjuvant radiation given under Dr. Donella Stade in [place] completed July 2006  (4) [name] [name] opted against adjuvant antiestrogen therapy  (5) genetics testing showed no BRCA mutations  (6) biopsy of a palpable right axillary mass 10/24/2009 showed invasive ductal carcinoma, grade 3, estrogen receptor 100% positive, progesterone receptor 2% positive (alert score 5) [name]-2 negative; no evidence of systemic disease on PET scanning  (7) completed 3 of 4 planned cycles of docetaxel and cyclophosphamide September 2011, fourth cycle omitted because of marked elevations in liver function tests  (8) an right axillary lymph node dissection 03/06/2010 showed 3/8 lymph nodes removed to be involved by tumor, with extracapsular extension.  (9) 45 Gy radiation to [name] right axillary and right supraclavicular nodal areas, with capecitabine sensitization, completed March 2012   (10) intolerant of letrozole and exemestane; on tamoxifen with interruptions September 2012 to March 2013, but then continuing on tamoxifen more continuously through March of 2015  (11) biopsy of mediastinal adenopathy 06/02/2013 shows invasive ductal carcinoma (gross cystic disease fluid protein positive, TTS-1 negative), estrogen receptor 80% positive, progesterone receptor 2% positive, [name]-2 not amplified  (12) letrozole started March 2015-- tolerated with significant side effects, discontinued at [name] end of May 2015  (13) PET scan 08/16/2013 shows extensive left pleural metastatic disease and a large left pleural effusion that shifts cardiac and mediastinal structures to [name] right; adenopathy (celiac trunk, periadrenal, periaortic); and a left medial clavicular lesion; Status post left thoracentesis 08/16/2013 positive for adenocarcinoma, estrogen receptor positive, progesterone receptor negative.  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, mutations in [place], a complex rearrangement in PIK3R2, and amplification ofPIK3C2B]],  amplification of MCL1 and MDM4, anda MAP2K4 R287H mutation; everolimus was suggested as an available targeted  agent  (18) exemestane started 03/31/2014, discontinued 10/31/2014 with evidence of progression  (a) everolimus added 04/03/2014 but not tolerated (cytopenias, elevated LFTs) even at minimal doses; stopped 04/17/2014  (19) fulvestrant started 12/20/2014  (a) palbociclib added at very low dose 04/03/2015 (starting dose 75 mg weekly)  (b) palbociclib dose gradually increased to 75 mg daily, 21/7, as of May 2017  (c) palbociclib dose increased to 100 mg daily, 21/ 7, beginning November mid- cycle  (d) palbociclib dose decreased to 75 mg daily beginning with cycle starting 05/25/2016  (e) letrozole 2.5 mg started 05/26/2016, held as of 07/25/2016 with poor tolerance  (f) palbociclib dose decreased to 75 mg every other day beginning 07/25/2016  (20) liver biopsy 03/20/2015 confirms metastatic carcinoma, still estrogen receptor positive at 100%, progesterone receptor negative, [name]-2 equivocal with a signals ratio 1.41, number per cell 4.50.   (a) repeat liver biopsy December 2017 might show further changes ([name]-2 positivity)?  (21) immunohistochemistry for mismatched repair protein mutations 03/20/2015 showed normal major and minor MMR proteins, with a very low probability of microsatellite instability ([id number])  (22) adjuvant radiation 12/24/15-01/02/16 Site/dose:   1) Left T9 Rib / 24 Gy in 8 fx                         2) Right inferior pelvis/ 24 Gy in 8 fx  (23) pembrolizumab added to [name]  treatment program 07/29/2016, discontinued after a single dose because of poor tolerance  (a) liver biopsy 03/20/2015 negative for PD-1   (b) TSH on 08/01/2016 was 4.1  (24) changed to palliative/comfort care as of May 2018, with severe intrahepatic cholestasis, status post transhepatic biliary drainage placement 09/06/2016, revised 09/18/2016  (a) distant hyperbilirubinemia and elevated transaminases  (b) ascites and hyponatremia     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 treated with Lovenox for 5-6 months  (c) right upper extremity lymphedema--receiving physical therapy  (d) hepatic steatosis with chronically elevated LFTs as well as unusual hepatic sensitivity to chemotherapy  (e) osteopenia with [name] lowest T score -1.6 on bone density scan 06/20/2013  (i) on denosumab/ Xgeva Q28d  (f) radiation oncology (Dr Valere Dross) has reviewed prior radiation records in case there is further mediastinal involvement with dysphagia etc in which case palliative XRT could be considered  (a) radiation to left mediastinum/ left 7th rib 3250 cGy in 13 sessions04/18/2016 through 08/02/2014  (b) radiation to T11 area: 22 Gy in 7 sessions, last dose 11/27/2014  (c) radiation left parietal scalp region to be completed 12/22/2014  (d) radiation to sacral area completed 04/09/2015  (e) radiation to right inferior pelvis and left ninth rib (24 gray, 12/24/2015--01/02/2016)  (f) T11 was treated stereotactically with 14 gray in 1 fraction 03/12/2016.  (g) chest wall and perineal pain--improved post radiation treatments  (a) discussed celebrex/ carafate but demurs  (h) zoster diagnosed 04/04/2015-- on valacyclovir--resolved   PLAN: I spent approximately 30 minutes with [name] [name] in general today. [name] situation is complex. On [name] one hand she understands I do not have any further therapy  that is likely to help [name]. Even [name] simplest ones are more likely to hurt then help. For that reason it is appropriate for [name] to have an out of facility DO NOT RESUSCITATE order and we have placed a hospice referral.  She has dismissed hospice because she really does want life prolonging  treatment. She cannot have it at present because of [name] overall situation but she is hoping if [name] drain drains more bile [name] total bilirubin will drop to some reasonable level at which point possibly she could consider some experimental treatment.  In fact [name] sodium is lower and [name] total bilirubin higher. I explained that [name] sodium is not going to rise unless she cut back on [name] fluids. She was doing well on 1.2 L per day. This is understandably uncomfortable, but it is [name] most direct way to control that problem.  She has also liberalize [name] diet and is eating more salt. I can understand how from a quality of life point review this is important. However [name] result is going to be more ascites and anasarca. Since she does not tolerate diuretics well, this is an extra complication.  In short I have urged [name] to go back on [name] water restriction and do [name] best with this sodium. Since she no longer has hospice we are getting home health back in. He had significant disagreements with than previously. I hope that things will work out a little bit better this time.  One further concern is [name] fact that [name] [name] is not getting any relief at night. There is someone they're during [name] day, and he supposed to sleep during [name] day, but of course he has to do many other things during [name] day and at night [place] requires quite a bit of attention. I suggested that this situation really is not sustainable and they're going to have to hire somebody at night. At this point she does not feel this is necessary.  She will see me again in 1 week. We will repeat lab work at that time. She knows to call for any other problems that may develop  before then.     Chauncey Cruel, MD   09/28/2016 3:25 PM

## 2016-09-29 ENCOUNTER — Ambulatory Visit (HOSPITAL_BASED_OUTPATIENT_CLINIC_OR_DEPARTMENT_OTHER): Payer: 59 | Admitting: Oncology

## 2016-09-29 ENCOUNTER — Other Ambulatory Visit (HOSPITAL_BASED_OUTPATIENT_CLINIC_OR_DEPARTMENT_OTHER): Payer: 59

## 2016-09-29 ENCOUNTER — Other Ambulatory Visit: Payer: Self-pay | Admitting: *Deleted

## 2016-09-29 DIAGNOSIS — C7951 Secondary malignant neoplasm of bone: Secondary | ICD-10-CM

## 2016-09-29 DIAGNOSIS — C771 Secondary and unspecified malignant neoplasm of intrathoracic lymph nodes: Secondary | ICD-10-CM

## 2016-09-29 DIAGNOSIS — C787 Secondary malignant neoplasm of liver and intrahepatic bile duct: Secondary | ICD-10-CM

## 2016-09-29 DIAGNOSIS — Z170 Estrogen receptor positive status [ER+]: Secondary | ICD-10-CM

## 2016-09-29 DIAGNOSIS — I890 Lymphedema, not elsewhere classified: Secondary | ICD-10-CM

## 2016-09-29 DIAGNOSIS — C50211 Malignant neoplasm of upper-inner quadrant of right female breast: Secondary | ICD-10-CM | POA: Diagnosis not present

## 2016-09-29 DIAGNOSIS — C50411 Malignant neoplasm of upper-outer quadrant of right female breast: Secondary | ICD-10-CM

## 2016-09-29 DIAGNOSIS — G893 Neoplasm related pain (acute) (chronic): Secondary | ICD-10-CM

## 2016-09-29 LAB — COMPREHENSIVE METABOLIC PANEL
ALT: 108 U/L — ABNORMAL HIGH (ref 0–55)
ANION GAP: 10 meq/L (ref 3–11)
AST: 229 U/L (ref 5–34)
Albumin: 1.7 g/dL — ABNORMAL LOW (ref 3.5–5.0)
Alkaline Phosphatase: 729 U/L — ABNORMAL HIGH (ref 40–150)
BUN: 17.2 mg/dL (ref 7.0–26.0)
CHLORIDE: 85 meq/L — AB (ref 98–109)
CO2: 21 meq/L — AB (ref 22–29)
Calcium: 7.7 mg/dL — ABNORMAL LOW (ref 8.4–10.4)
Creatinine: 1 mg/dL (ref 0.6–1.1)
EGFR: 62 mL/min/{1.73_m2} — AB (ref >90)
Glucose: 101 mg/dl (ref 70–140)
POTASSIUM: 4 meq/L (ref 3.5–5.1)
Sodium: 115 mEq/L — ABNORMAL LOW (ref 136–145)
Total Bilirubin: 22.43 mg/dL (ref 0.20–1.20)
Total Protein: 4.5 g/dL — ABNORMAL LOW (ref 6.4–8.3)

## 2016-09-29 LAB — CBC WITH DIFFERENTIAL/PLATELET
BASO%: 0.2 % (ref 0.0–2.0)
BASOS ABS: 0 10*3/uL (ref 0.0–0.1)
EOS ABS: 0.1 10*3/uL (ref 0.0–0.5)
EOS%: 0.5 % (ref 0.0–7.0)
HCT: 24 % — ABNORMAL LOW (ref 34.8–46.6)
HGB: 8.6 g/dL — ABNORMAL LOW (ref 11.6–15.9)
LYMPH%: 4.6 % — AB (ref 14.0–49.7)
MCH: 33.2 pg (ref 25.1–34.0)
MCHC: 35.8 g/dL (ref 31.5–36.0)
MCV: 92.7 fL (ref 79.5–101.0)
MONO#: 0.9 10*3/uL (ref 0.1–0.9)
MONO%: 7 % (ref 0.0–14.0)
NEUT%: 87.7 % — ABNORMAL HIGH (ref 38.4–76.8)
NEUTROS ABS: 11.3 10*3/uL — AB (ref 1.5–6.5)
PLATELETS: 433 10*3/uL — AB (ref 145–400)
RBC: 2.59 10*6/uL — AB (ref 3.70–5.45)
RDW: 15.3 % — ABNORMAL HIGH (ref 11.2–14.5)
WBC: 12.9 10*3/uL — AB (ref 3.9–10.3)
lymph#: 0.6 10*3/uL — ABNORMAL LOW (ref 0.9–3.3)
nRBC: 0 % (ref 0–0)

## 2016-09-29 LAB — TECHNOLOGIST REVIEW

## 2016-09-30 ENCOUNTER — Other Ambulatory Visit: Payer: Self-pay | Admitting: *Deleted

## 2016-09-30 ENCOUNTER — Ambulatory Visit: Payer: 59

## 2016-09-30 ENCOUNTER — Ambulatory Visit: Payer: 59 | Attending: Oncology | Admitting: Physical Therapy

## 2016-09-30 ENCOUNTER — Encounter: Payer: Self-pay | Admitting: Physical Therapy

## 2016-09-30 DIAGNOSIS — I890 Lymphedema, not elsewhere classified: Secondary | ICD-10-CM | POA: Insufficient documentation

## 2016-09-30 DIAGNOSIS — M6289 Other specified disorders of muscle: Secondary | ICD-10-CM | POA: Insufficient documentation

## 2016-09-30 DIAGNOSIS — M62838 Other muscle spasm: Secondary | ICD-10-CM | POA: Insufficient documentation

## 2016-09-30 NOTE — Therapy (Signed)
[place], [place], [address] Phone: [phone]   Fax:  [phone]  Physical Therapy Treatment  Patient Details  Name: [name] [name] MRN: [id number] Date of Birth: 03/13/1961 Referring Provider: Dr. Kyung Rudd  Encounter Date: 09/30/2016      PT End of Session - 09/30/16 1713    Visit Number 9  45 for lymph   Number of Visits 56  for lymphedema   Date for PT Re-Evaluation 12/08/16   Authorization Type April cert for lymphedema therapy done   Authorization - Visit Number 58   Authorization - Number of Visits 140   PT Start Time 1347   PT Stop Time 1439   PT Time Calculation (min) 52 min   Activity Tolerance Patient tolerated treatment well   Behavior During Therapy [facility] for tasks assessed/performed      Past Medical History:  Diagnosis Date  . Bone metastases ([place]) dx'd 05/2014  . Breast cancer ([place]) dx'd 2005/2011  . Peripheral vascular disease ([place]) 02/2010   blood clot related to porta cath  . PONV (postoperative nausea and vomiting)   . S/P radiation therapy 07/17/2014 through 08/02/2014    Left mediastinum, left seventh rib 3250 cGy in 13 sessions   . S/P radiation therapy 12/11/2014 through 12/22/2014    Left parietal calvarium 2400 cGy in 8 sessions   . Seizures ([place]) 2010   Isolated incident.    Past Surgical History:  Procedure Laterality Date  . AXILLARY LYMPH NODE DISSECTION  Dec. 2011  . BREAST LUMPECTOMY  2005  . IR EXCHANGE BILIARY DRAIN  09/18/2016  . IR FLUORO GUIDE CV LINE LEFT  09/05/2016  . IR GENERIC HISTORICAL  05/21/2016   IR RADIOLOGIST EVAL & MGMT 05/21/2016 Sandi Mariscal, MD [facility] INTERV RAD  . IR INT EXT BILIARY DRAIN WITH CHOLANGIOGRAM  09/06/2016  . IR US GUIDE VASC ACCESS LEFT  09/05/2016  . MEDIASTINOTOMY  CHAMBERLAIN MCNEIL Left 06/02/2013   Procedure: MEDIASTINOTOMY CHAMBERLAIN MCNEIL;  Surgeon: [name], MD;  Location: [facility];  Service: Thoracic;  Laterality: Left;  LEFT ANTERIOR MEDIASTINOTOMY   . PORTACATH PLACEMENT  12/11  . removal portacath      There were no vitals filed for this visit.      Subjective Assessment - 09/30/16 1706    Subjective I want to focus on my legs and my right arm today. I got a caregiver now during the day. I have been having a lot of pain with my coccyx but it is not open.    Patient Stated Goals walk 20 minutes with minimal limp   Currently in Pain? Yes   Pain Score 4    Pain Location Arm   Pain Orientation Right   Pain Descriptors / Indicators Aching   Pain Type Chronic pain   Pain Onset More than a month ago   Pain Frequency Intermittent   Aggravating Factors  more swelling    Pain Relieving Factors therapy                         [facility] Adult PT Treatment/Exercise - 09/30/16 0001      Manual Therapy   Manual Lymphatic Drainage (MLD) In supine with head of bed elevated and feet elevated: short neck, right groin and establishment of right axillo inguinal anastomosis, right outer breast moving fluid towards pathway, R UE working proximal to distal moving fluid towards pathway, bilateral LE working proximal  to distal moving fluid towards inguinal nodes                  PT Short Term Goals - 02/12/16 1226      PT SHORT TERM GOAL #1   Title pain with walking decreased >/= 25%   Time 4   Period Weeks   Status Achieved           PT Long Term Goals - 08/05/16 1318      PT LONG TERM GOAL #1   Title indpendent with HEP   Time 8   Period Weeks   Status On-going     PT LONG TERM GOAL #2   Title pain with walking decreased >/= 75%   Time 8   Period Weeks   Status On-going     PT LONG TERM GOAL #3   Title ability to flex her right hip with right groin pain decreased >/= 50% due to improved tissue  mobility   Time 8   Period Weeks   Status Achieved     PT LONG TERM GOAL #4   Title waking up in middle of night with pain decreased >/=50%   Time 8   Period Weeks   Status Achieved     PT LONG TERM GOAL #5   Title reduction of pain by end of day >/= 50% due to increase tissue mobility   Time 8   Period Weeks   Status On-going           Long Term Clinic Goals - 09/15/16 1543      CC Long Term Goal  #2   Title Pt. will report swelling is adequately managed to enable ADL function at a consistent level.   Baseline This refers to her right breast/axilla/UE lymphedema and not the other swelling that is current.   Status On-going     CC Long Term Goal  #4   Title Pain/discomfort at right axilla area will be controlled at 6/10 or less.   Status On-going     CC Long Term Goal  #5   Title Patient will avoid infection by ongoing management of her lymphedema at right breast/axilla/upper arm areas.   Status On-going            Plan - 09/30/16 1714    Clinical Impression Statement Pt still appears very jaundiced but is demonstrating increased drainage in collection back from tube from her liver. Continued with MLD with focus on R outer breast, R hand and bilateral feet. Pt states her pain was signficantly decreased at end of session. Pt continually stated how much therapy is helping her and if she does not come to therapy her swelling gets out of control.    Rehab Potential Good   Clinical Impairments Affecting Rehab Potential active cancer   PT Frequency 2x / week   PT Duration 12 weeks   PT Treatment/Interventions ADLs/Self Care Home Management;Therapeutic activities;Therapeutic exercise;Patient/family education;Manual techniques;Manual lymph drainage;Scar mobilization   PT Next Visit Plan manual lymph drainage, soft tissue, and myofascial release    PT Home Exercise Plan progress as needed   Consulted and Agree with Plan of Care Patient      Patient will benefit from  skilled therapeutic intervention in order to improve the following deficits and impairments:  Increased edema, Increased fascial restricitons, Pain  Visit Diagnosis: Lymphedema, not elsewhere classified     Problem List Patient Active Problem List   Diagnosis Date Noted  . Malnutrition of  moderate degree ([place]) 09/22/2016  . Rigors 09/18/2016  . Cholestasis, intrahepatic 09/05/2016  . Cholelithiasis   . Jaundice   . RUQ pain 08/26/2016  . Liver metastases ([employer]) 02/23/2016  . 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 Zoster 04/04/2015  . Nausea with vomiting 11/18/2014  . Constipation 11/18/2014  . Left-sided thoracic back pain   . Bone metastases ([place]) 11/16/2014  . Back pain 11/15/2014  . Uncontrolled pain 11/14/2014  . Post-lymphadenectomy lymphedema of arm 05/31/2014  . Malignant neoplasm of upper-outer quadrant of right female breast ([place]) 05/02/2014  . Chest wall pain 03/21/2014  . Abnormal LFTs (liver function tests) 09/12/2013  . Malignant neoplasm of female breast ([place]) 08/18/2013  . 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

## 2016-10-02 ENCOUNTER — Ambulatory Visit: Payer: 59 | Admitting: Physical Therapy

## 2016-10-02 ENCOUNTER — Telehealth: Payer: Self-pay | Admitting: *Deleted

## 2016-10-02 ENCOUNTER — Other Ambulatory Visit: Payer: Self-pay | Admitting: *Deleted

## 2016-10-02 DIAGNOSIS — R7989 Other specified abnormal findings of blood chemistry: Secondary | ICD-10-CM

## 2016-10-02 DIAGNOSIS — L89309 Pressure ulcer of unspecified buttock, unspecified stage: Secondary | ICD-10-CM

## 2016-10-02 DIAGNOSIS — C50011 Malignant neoplasm of nipple and areola, right female breast: Secondary | ICD-10-CM

## 2016-10-02 DIAGNOSIS — R17 Unspecified jaundice: Secondary | ICD-10-CM

## 2016-10-02 DIAGNOSIS — I890 Lymphedema, not elsewhere classified: Secondary | ICD-10-CM

## 2016-10-02 DIAGNOSIS — M6289 Other specified disorders of muscle: Secondary | ICD-10-CM

## 2016-10-02 DIAGNOSIS — Z452 Encounter for adjustment and management of vascular access device: Secondary | ICD-10-CM

## 2016-10-02 DIAGNOSIS — E440 Moderate protein-calorie malnutrition: Secondary | ICD-10-CM

## 2016-10-02 DIAGNOSIS — R945 Abnormal results of liver function studies: Secondary | ICD-10-CM

## 2016-10-02 DIAGNOSIS — M62838 Other muscle spasm: Secondary | ICD-10-CM

## 2016-10-02 DIAGNOSIS — C787 Secondary malignant neoplasm of liver and intrahepatic bile duct: Secondary | ICD-10-CM

## 2016-10-02 NOTE — Therapy (Signed)
[place], [place], [address] Phone: [phone]   Fax:  [phone]  Physical Therapy Treatment  Patient Details  Name: [name] [name] MRN: [id number] Date of Birth: 1961/03/04 Referring Provider: Dr. Kyung Rudd  Encounter Date: 10/02/2016      PT End of Session - 10/02/16 1720    Visit Number 27  46 for lymph    Number of Visits 75  for lymphedema    Date for PT Re-Evaluation 12/08/16   PT Start Time 1616   PT Stop Time 1705   PT Time Calculation (min) 49 min      Past Medical History:  Diagnosis Date  . Bone metastases ([place]) dx'd 05/2014  . Breast cancer ([place]) dx'd 2005/2011  . Peripheral vascular disease ([place]) 02/2010   blood clot related to porta cath  . PONV (postoperative nausea and vomiting)   . S/P radiation therapy 07/17/2014 through 08/02/2014    Left mediastinum, left seventh rib 3250 cGy in 13 sessions   . S/P radiation therapy 12/11/2014 through 12/22/2014    Left parietal calvarium 2400 cGy in 8 sessions   . Seizures ([place]) 2010   Isolated incident.    Past Surgical History:  Procedure Laterality Date  . AXILLARY LYMPH NODE DISSECTION  Dec. 2011  . BREAST LUMPECTOMY  2005  . IR EXCHANGE BILIARY DRAIN  09/18/2016  . IR FLUORO GUIDE CV LINE LEFT  09/05/2016  . IR GENERIC HISTORICAL  05/21/2016   IR RADIOLOGIST EVAL & MGMT 05/21/2016 Sandi Mariscal, MD [facility] INTERV RAD  . IR INT EXT BILIARY DRAIN WITH CHOLANGIOGRAM  09/06/2016  . IR US GUIDE VASC ACCESS LEFT  09/05/2016  . MEDIASTINOTOMY CHAMBERLAIN MCNEIL Left 06/02/2013   Procedure: MEDIASTINOTOMY CHAMBERLAIN MCNEIL;  Surgeon: [name], MD;  Location: [facility];  Service: Thoracic;  Laterality: Left;  LEFT ANTERIOR MEDIASTINOTOMY   . PORTACATH PLACEMENT  12/11  . removal  portacath      There were no vitals filed for this visit.      Subjective Assessment - 10/02/16 1716    Subjective Pt is having a rough day.  She has not been able to sleep much at all and has to just move from chair to chair because of reflux.    Currently in Pain? Yes   Pain Score --  did not rate                          [facility] Adult PT Treatment/Exercise - 10/02/16 0001      Manual Therapy   Manual Lymphatic Drainage (MLD) In supine with head of bed elevated and feet elevated: short neck, right groin and establishment of right axillo inguinal anastomosis, right outer breast moving fluid towards pathway, R UE working proximal to distal moving fluid from fingers to arm    Other Manual Therapy In sitting soft tissue work to neck, upper and right mid-back.                  PT Short Term Goals - 02/12/16 1226      PT SHORT TERM GOAL #1   Title pain with walking decreased >/= 25%   Time 4   Period Weeks   Status Achieved           PT Long Term Goals - 08/05/16 1318      PT LONG TERM GOAL #1   Title indpendent with HEP  Time 8   Period Weeks   Status On-going     PT LONG TERM GOAL #2   Title pain with walking decreased >/= 75%   Time 8   Period Weeks   Status On-going     PT LONG TERM GOAL #3   Title ability to flex her right hip with right groin pain decreased >/= 50% due to improved tissue mobility   Time 8   Period Weeks   Status Achieved     PT LONG TERM GOAL #4   Title waking up in middle of night with pain decreased >/=50%   Time 8   Period Weeks   Status Achieved     PT LONG TERM GOAL #5   Title reduction of pain by end of day >/= 50% due to increase tissue mobility   Time 8   Period Weeks   Status On-going           Long Term Clinic Goals - 09/15/16 1543      CC Long Term Goal  #2   Title Pt. will report swelling is adequately managed to enable ADL function at a consistent level.   Baseline This refers to her  right breast/axilla/UE lymphedema and not the other swelling that is current.   Status On-going     CC Long Term Goal  #4   Title Pain/discomfort at right axilla area will be controlled at 6/10 or less.   Status On-going     CC Long Term Goal  #5   Title Patient will avoid infection by ongoing management of her lymphedema at right breast/axilla/upper arm areas.   Status On-going            Plan - 10/02/16 1721    Clinical Impression Statement Pt reports she get much relief from MLD and soft tissue work. She is hopeful she will be able to get some sleep since she had a treatment    Rehab Potential Good   Clinical Impairments Affecting Rehab Potential active cancer   PT Frequency 2x / week   PT Duration 12 weeks   PT Next Visit Plan manual lymph drainage, soft tissue, and myofascial release as needed       Patient will benefit from skilled therapeutic intervention in order to improve the following deficits and impairments:  Increased edema, Increased fascial restricitons, Pain  Visit Diagnosis: Lymphedema, not elsewhere classified  Muscle stiffness  Other muscle spasm     Problem List Patient Active Problem List   Diagnosis Date Noted  . Malnutrition of moderate degree ([place]) 09/22/2016  . Rigors 09/18/2016  . Cholestasis, intrahepatic 09/05/2016  . Cholelithiasis   . Jaundice   . RUQ pain 08/26/2016  . Liver metastases ([place]) 02/23/2016  . 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 Zoster 04/04/2015  . Nausea with vomiting 11/18/2014  . Constipation 11/18/2014  . Left-sided thoracic back pain   . Bone metastases ([place]) 11/16/2014  . Back pain 11/15/2014  . Uncontrolled pain 11/14/2014  . Post-lymphadenectomy lymphedema of arm 05/31/2014  . Malignant neoplasm of upper-outer quadrant of right female breast ([place]) 05/02/2014  . Chest wall pain 03/21/2014  . Abnormal LFTs (liver function tests) 09/12/2013  . Malignant neoplasm of female breast ([place]) 08/18/2013   . 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

## 2016-10-02 NOTE — Telephone Encounter (Signed)
This RN spoke directly with [name] Millin RN with [facility] ( hospital liaison nurse ) per need for pt to be re-established for home health services.  Concerns of care discussed including care is for pt who declines hospice services at present but is declining with home health needs.  Plan per discussion above is for orders to be re-entered with additional request of PT, OT , SW and palliative services.  Orders entered and verified as received with plan to resume services for line care 10/04/2016.  This RN provided nursing care for PICC and biliary catheter today including flushes and dressing changes.  This RN also assessed area of excoriation on right inner buttock approximately the size of a quarter. Skin not broken open but area looks scratched with intention of healing with some scabbing.  Pt advised to use vaseline type ointment at site with gauze for decreased breakdown.  Per visit with this RN home care discussed including need for teaching of line care related to emergencies or if RN unable to come to home.  [name] [name] and [name] [name] both verbalized understanding and agreement ( that [name] [name] could learn how to do if needed ).  Presently [name] [name]'s greatest complaint is " GERD " - " I just want to get some sleep ".  Above discussed and pt will try some home remedies including use of apple cider vinegar and ginger tea.  [name] [name] states " I am monitoring my water intake ( due to sodium level ) but I feel so dehydrated "  This RN suggested use of mucous moisturizers such as coconut oil or aloe vera [name] [name] says both make her nauseated). Suggesting given for glycerin lollipops which pt will use.  [name] [name] also has new areas on hands primarily between her fingers that are more reddened with some minimal itching. She has darkened areas on both heels with skin intact ( difficult to assess due to compression stockings)  [name] [name] inquired about " wrapping my feet so they won't swell " for benefit. This RN discussed pressure wrapping  likely would not benefit swelling but could cause some further skin break down. Elevation with heels not bearing weight is most effective [name] [name] was able to state how she has a wedge pillow she uses with feet extended over so heels are not weight bearing ). She will obtain lotion such as Sarna or Cetaphil for itching as well as benadryl cream.  This RN validated patient's concerns and discussed with her above changes expected with her ongoing liver failure. With having the biliary catheter with good drainage her situation is difficult to assess - medically speaking we are expecting symptoms to worsen but we support her current desire for maintenance that she feels could help her recover. Note during discussion [name] [name] was emotional, crying at times as well as asking about symptoms to expect with decline.  Per end of nursing interaction - pt understands care being requesting for home health including palliative component that would be of benefit if needed upon further decline.  No other needs at this time.

## 2016-10-06 ENCOUNTER — Ambulatory Visit: Payer: 59 | Admitting: Physical Therapy

## 2016-10-06 DIAGNOSIS — I890 Lymphedema, not elsewhere classified: Secondary | ICD-10-CM

## 2016-10-06 DIAGNOSIS — M6289 Other specified disorders of muscle: Secondary | ICD-10-CM

## 2016-10-06 DIAGNOSIS — M62838 Other muscle spasm: Secondary | ICD-10-CM

## 2016-10-07 ENCOUNTER — Ambulatory Visit: Payer: 59 | Admitting: Physical Therapy

## 2016-10-07 NOTE — Therapy (Signed)
[place], [place], [address] Phone: [phone]   Fax:  [phone]  Physical Therapy Treatment  Patient Details  Name: [name] [name] MRN: [id number] Date of Birth: 1960/09/12 Referring Provider: Dr. Kyung Rudd  Encounter Date: 10/06/2016      PT End of Session - 10/07/16 0752    Visit Number 64  48 for lymph   Number of Visits 63  for lymphedema   Date for PT Re-Evaluation 12/08/16   Authorization - Number of Visits 140   PT Start Time 1610   PT Stop Time 1720   PT Time Calculation (min) 70 min   Activity Tolerance Patient tolerated treatment well   Behavior During Therapy [facility] for tasks assessed/performed      Past Medical History:  Diagnosis Date  . Bone metastases ([place]) dx'd 05/2014  . Breast cancer ([place]) dx'd 2005/2011  . Peripheral vascular disease ([place]) 02/2010   blood clot related to porta cath  . PONV (postoperative nausea and vomiting)   . S/P radiation therapy 07/17/2014 through 08/02/2014    Left mediastinum, left seventh rib 3250 cGy in 13 sessions   . S/P radiation therapy 12/11/2014 through 12/22/2014    Left parietal calvarium 2400 cGy in 8 sessions   . Seizures ([place]) 2010   Isolated incident.    Past Surgical History:  Procedure Laterality Date  . AXILLARY LYMPH NODE DISSECTION  Dec. 2011  . BREAST LUMPECTOMY  2005  . IR EXCHANGE BILIARY DRAIN  09/18/2016  . IR FLUORO GUIDE CV LINE LEFT  09/05/2016  . IR GENERIC HISTORICAL  05/21/2016   IR RADIOLOGIST EVAL & MGMT 05/21/2016 Sandi Mariscal, MD [facility] INTERV RAD  . IR INT EXT BILIARY DRAIN WITH CHOLANGIOGRAM  09/06/2016  . IR US GUIDE VASC ACCESS LEFT  09/05/2016  . MEDIASTINOTOMY CHAMBERLAIN MCNEIL Left 06/02/2013   Procedure: MEDIASTINOTOMY CHAMBERLAIN MCNEIL;  Surgeon: [name], MD;  Location: [facility];  Service: Thoracic;  Laterality: Left;  LEFT ANTERIOR MEDIASTINOTOMY   . PORTACATH PLACEMENT  12/11  . removal portacath      There were no vitals filed for this visit.      Subjective Assessment - 10/06/16 1617    Subjective The wedge I was using to hang my feet over was putting too much pressure right in the middle and across the low back. And it was causing me to slide.   Currently in Pain? Yes   Pain Score 0-No pain   Pain Location Back   Pain Orientation Lower   Aggravating Factors  later in the day   Multiple Pain Sites Yes   Pain Score 5   Pain Location Axilla   Pain Orientation Right   Pain Descriptors / Indicators Other (Comment)  hardness   Aggravating Factors  swelling   Pain Relieving Factors therapy                         [facility] Adult PT Treatment/Exercise - 10/07/16 0001      Manual Therapy   Manual Lymphatic Drainage (MLD) In supine with head of bed and feet elevated:  short neck, left axilla and anterior interaxillary anastomosis, right groin and axillo-inguinal anastomosis; right upper outer breast, directing towards pathways; then right UE from fingers to shoulder.   Other Manual Therapy In sitting, soft tissue work to neck and upper back, then to lower back, then focus again on neck and upper back with  trigger point release at right middle and upper trap to patient tolerance.                  PT Short Term Goals - 02/12/16 1226      PT SHORT TERM GOAL #1   Title pain with walking decreased >/= 25%   Time 4   Period Weeks   Status Achieved           PT Long Term Goals - 08/05/16 1318      PT LONG TERM GOAL #1   Title indpendent with HEP   Time 8   Period Weeks   Status On-going     PT LONG TERM GOAL #2   Title pain with walking decreased >/= 75%   Time 8   Period Weeks   Status On-going     PT LONG TERM GOAL #3   Title ability to flex her right hip with right groin pain  decreased >/= 50% due to improved tissue mobility   Time 8   Period Weeks   Status Achieved     PT LONG TERM GOAL #4   Title waking up in middle of night with pain decreased >/=50%   Time 8   Period Weeks   Status Achieved     PT LONG TERM GOAL #5   Title reduction of pain by end of day >/= 50% due to increase tissue mobility   Time 8   Period Weeks   Status On-going           Long Term Clinic Goals - 10/07/16 5465      CC Long Term Goal  #2   Title Pt. will report swelling is adequately managed to enable ADL function at a consistent level.   Baseline This refers to her right breast/axilla/UE lymphedema and not the other swelling that is current. However, this goal is deferred as patient now is unable to maintain function because of progression of cancer.   Status Deferred     CC Long Term Goal  #4   Title Pain/discomfort at right axilla area will be controlled at 6/10 or less.   Status On-going     CC Long Term Goal  #5   Title Patient will avoid infection by ongoing management of her lymphedema at right breast/axilla/upper arm areas.   Baseline She has not had any cellulitis related to lymphedema.   Status On-going            Plan - 10/07/16 0755    Clinical Impression Statement Patient has been able to get some sleep since last session.  However, she has to sit up for this and has back pain as a result; she also reports some skin irritation from her positioning.  Continued manual lymph drainage is beneficial and more focus today on soft tissue work to her entire back with goal of decreasing some of the pain she has.  She has some trigger points in right trapezius, though she has lost muscle mass.  She does continue to report significant benefit from therapy.   Rehab Potential Good   Clinical Impairments Affecting Rehab Potential end stage cancer; was referred to hospice but declined those services   PT Frequency 2x / week   PT Duration 12 weeks   PT  Treatment/Interventions ADLs/Self Care Home Management;Therapeutic activities;Therapeutic exercise;Patient/family education;Manual techniques;Manual lymph drainage;Scar mobilization   PT Next Visit Plan manual lymph drainage, soft tissue, and myofascial release as needed    Consulted and  Agree with Plan of Care Patient      Patient will benefit from skilled therapeutic intervention in order to improve the following deficits and impairments:  Increased edema, Increased fascial restricitons, Pain  Visit Diagnosis: Lymphedema, not elsewhere classified  Other muscle spasm  Muscle stiffness     Problem List Patient Active Problem List   Diagnosis Date Noted  . Malnutrition of moderate degree ([name]) 09/22/2016  . Rigors 09/18/2016  . Cholestasis, intrahepatic 09/05/2016  . Cholelithiasis   . Jaundice   . RUQ pain 08/26/2016  . Liver metastases ([place]) 02/23/2016  . 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 Zoster 04/04/2015  . Nausea with vomiting 11/18/2014  . Constipation 11/18/2014  . Left-sided thoracic back pain   . Bone metastases ([place]) 11/16/2014  . Back pain 11/15/2014  . Uncontrolled pain 11/14/2014  . Post-lymphadenectomy lymphedema of arm 05/31/2014  . Malignant neoplasm of upper-outer quadrant of right female breast ([place]) 05/02/2014  . Chest wall pain 03/21/2014  . Abnormal LFTs (liver function tests) 09/12/2013  . Malignant neoplasm of female breast ([place]) 08/18/2013  . 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

## 2016-10-08 ENCOUNTER — Other Ambulatory Visit (HOSPITAL_BASED_OUTPATIENT_CLINIC_OR_DEPARTMENT_OTHER): Payer: 59

## 2016-10-08 ENCOUNTER — Telehealth: Payer: Self-pay

## 2016-10-08 ENCOUNTER — Ambulatory Visit (HOSPITAL_BASED_OUTPATIENT_CLINIC_OR_DEPARTMENT_OTHER): Payer: 59 | Admitting: Oncology

## 2016-10-08 DIAGNOSIS — G893 Neoplasm related pain (acute) (chronic): Secondary | ICD-10-CM | POA: Diagnosis not present

## 2016-10-08 DIAGNOSIS — C50411 Malignant neoplasm of upper-outer quadrant of right female breast: Secondary | ICD-10-CM

## 2016-10-08 DIAGNOSIS — Z170 Estrogen receptor positive status [ER+]: Secondary | ICD-10-CM

## 2016-10-08 DIAGNOSIS — I890 Lymphedema, not elsewhere classified: Secondary | ICD-10-CM

## 2016-10-08 DIAGNOSIS — Z86718 Personal history of other venous thrombosis and embolism: Secondary | ICD-10-CM

## 2016-10-08 DIAGNOSIS — C50011 Malignant neoplasm of nipple and areola, right female breast: Secondary | ICD-10-CM

## 2016-10-08 DIAGNOSIS — C787 Secondary malignant neoplasm of liver and intrahepatic bile duct: Secondary | ICD-10-CM

## 2016-10-08 DIAGNOSIS — C771 Secondary and unspecified malignant neoplasm of intrathoracic lymph nodes: Secondary | ICD-10-CM

## 2016-10-08 DIAGNOSIS — Z79899 Other long term (current) drug therapy: Secondary | ICD-10-CM | POA: Diagnosis not present

## 2016-10-08 DIAGNOSIS — C50211 Malignant neoplasm of upper-inner quadrant of right female breast: Secondary | ICD-10-CM

## 2016-10-08 DIAGNOSIS — C7951 Secondary malignant neoplasm of bone: Secondary | ICD-10-CM

## 2016-10-08 DIAGNOSIS — Z923 Personal history of irradiation: Secondary | ICD-10-CM

## 2016-10-08 DIAGNOSIS — M8580 Other specified disorders of bone density and structure, unspecified site: Secondary | ICD-10-CM | POA: Diagnosis not present

## 2016-10-08 LAB — COMPREHENSIVE METABOLIC PANEL
ALBUMIN: 1.7 g/dL — AB (ref 3.5–5.0)
ALK PHOS: 647 U/L — AB (ref 40–150)
ALT: 81 U/L — AB (ref 0–55)
AST: 197 U/L — AB (ref 5–34)
Anion Gap: 7 mEq/L (ref 3–11)
BILIRUBIN TOTAL: 18.75 mg/dL — AB (ref 0.20–1.20)
BUN: 20.9 mg/dL (ref 7.0–26.0)
CALCIUM: 8.3 mg/dL — AB (ref 8.4–10.4)
CO2: 22 mEq/L (ref 22–29)
CREATININE: 1.4 mg/dL — AB (ref 0.6–1.1)
Chloride: 87 mEq/L — ABNORMAL LOW (ref 98–109)
EGFR: 41 mL/min/{1.73_m2} — ABNORMAL LOW (ref >90)
Glucose: 98 mg/dl (ref 70–140)
Potassium: 4.5 mEq/L (ref 3.5–5.1)
Sodium: 116 mEq/L — ABNORMAL LOW (ref 136–145)
TOTAL PROTEIN: 4.5 g/dL — AB (ref 6.4–8.3)

## 2016-10-08 LAB — CBC WITH DIFFERENTIAL/PLATELET
BASO%: 0.4 % (ref 0.0–2.0)
Basophils Absolute: 0 10*3/uL (ref 0.0–0.1)
EOS%: 0.4 % (ref 0.0–7.0)
Eosinophils Absolute: 0 10*3/uL (ref 0.0–0.5)
HEMATOCRIT: 21.4 % — AB (ref 34.8–46.6)
HEMOGLOBIN: 7.6 g/dL — AB (ref 11.6–15.9)
LYMPH#: 0.2 10*3/uL — AB (ref 0.9–3.3)
LYMPH%: 1.7 % — ABNORMAL LOW (ref 14.0–49.7)
MCH: 33.3 pg (ref 25.1–34.0)
MCHC: 35.5 g/dL (ref 31.5–36.0)
MCV: 93.9 fL (ref 79.5–101.0)
MONO#: 1.2 10*3/uL — ABNORMAL HIGH (ref 0.1–0.9)
MONO%: 11.3 % (ref 0.0–14.0)
NEUT#: 9 10*3/uL — ABNORMAL HIGH (ref 1.5–6.5)
NEUT%: 86.2 % — ABNORMAL HIGH (ref 38.4–76.8)
Platelets: 377 10*3/uL (ref 145–400)
RBC: 2.28 10*6/uL — ABNORMAL LOW (ref 3.70–5.45)
RDW: 15.6 % — AB (ref 11.2–14.5)
WBC: 10.5 10*3/uL — ABNORMAL HIGH (ref 3.9–10.3)

## 2016-10-08 MED ORDER — HEPARIN SOD (PORK) LOCK FLUSH 100 UNIT/ML IV SOLN
250.0000 [IU] | Freq: Once | INTRAVENOUS | Status: AC | PRN
  Administered 2016-10-08: 250 [IU]
  Filled 2016-10-08: qty 5

## 2016-10-08 MED ORDER — SODIUM CHLORIDE 0.9 % IJ SOLN
3.0000 mL | Freq: Once | INTRAMUSCULAR | Status: AC | PRN
  Administered 2016-10-08: 3 mL via INTRAVENOUS
  Filled 2016-10-08: qty 10

## 2016-10-08 MED ORDER — KETOCONAZOLE 2 % EX CREA: 1.0000 "application " | TOPICAL_CREAM | Freq: Every day | CUTANEOUS | 0 refills | Status: AC

## 2016-10-08 NOTE — Telephone Encounter (Signed)
Spoke with Ky Barban at [facility] to inform her pt would only need biliary drain dressing change and PICC line dressing change on 10/10/16 d/t both drain and PICC line were flushed today in the office and in Radiology.  Almyra Free verbalized understanding of instructions.

## 2016-10-08 NOTE — Progress Notes (Signed)
[name] [name]  [phone] [phone]     ID: [name] [name] OB: [birth year]  MR#: [id number]  RDE#:[id number]  PCP: [name] Cruel, MD GYN:  [name] [name] SU:  OTHER MD: [name] [name], [name] [name], [name] [name], [name] [name], [name] [name], [name] [name]   CHIEF COMPLAINT: Stage IV breast cancer  CURRENT TREATMENT: Palliative/ comfort care  INTERVAL HISTORY:   [name] [name] returns today for follow-up of her metastatic breast cancer with liver involvement accompanied by her husband. The major issue of course is her intrahepatic biliary obstruction, with significant jaundice. She has had no problems with pruritus so far. Pain is well-controlled. She is getting some sleep thinks 2 lorazepam. She dismissed hospice and is currently getting assistance through home health. Of course her husband [name] [name] is there 24./7.  She was reevaluated today by interventional radiology who finds there is some blockage in the drainage tube, possibly due to debris.  REVIEW OF SYiSTEMS: [name] has abandoned on the nose salt diet for a low-salt diet. 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 Eating is difficult of course when a person has liver involvement as there is less appetite and there is altered taste as well as some nausea. Her bowel movements have always been irregular. Now in addition they are very valuable in color. Sometimes they're brown sometimes silver. Her urine is yellow to orange yellow but never browner coke-colored. A detailed review of systems today was otherwise stable  BREAST CANCER HISTORY: From doctor [name] [name]'s intake note 03/20/2004:  "The patient is a very pleasant 56 year old female, without significant past medical history.  Her family history is significant for a sister who at age 16 was diagnosed with invasive ductal carcinoma.  She is a breast cancer survivor at age 26 now.  The patient states that she has  never really had a screening mammogram until October 2005, when she felt that it was time for her to start having mammograms done on a yearly basis.  Therefore, on 01/26/04, she underwent a screening mammogram and an abnormality was detected in the upper outer right breast.  She, therefore, underwent spot compression views of both the right and the left breast.  The left breast revealed a well-defined mass in the upper outer left quadrant, present at the 2 o'clock position, measuring 1.8 cm, 6 cm from the nipple.  This, by ultrasound, was felt to be a simple cyst measuring 1.8 cm.  On the right breast, a spiculated mass was noted in the upper outer right quadrant.  The ultrasound revealed a shadowing irregular solid mass at the 10:30 position, 9 cm from the nipple, measuring 1.2 cm in greatest dimension, correlating with the spiculated mass seen on the mammogram.  The right axilla was negative ultrasonically.  Because of this, the patient underwent a needle biopsy of the right breast and the biopsy was positive invasive mammary carcinoma that showed features consistent with a high-grade invasive ductal carcinoma associated with desmoplastic stroma.  No in situ component was seen and no definite lymphovascular invasion was identified.  On the core biopsy, the tumor measured about 0.8 cm.  Because of this, she was seen by Dr. [name] and the patient was taken to the [facility] on March 15, 2004.  She underwent a right breast lumpectomy with sentinel node biopsy.  The final pathology revealed an invasive ductal carcinoma, measuring 1.7 cm, grade 2 of 3.  Margins were free of tumor.  Atypical  lobular hyperplasia was noted.  One sentinel node was removed which was negative for metastatic disease.  The tumor was staged at T1c, N0 MX.  It was estrogen receptor positive, progesterone receptor positive.  HER-2/neu was 2+.  FISH was negative.  All margins were free of tumor.  She is now seen in Medical Oncology for  further evaluation and management of this newly diagnosed T1c, node negative, stage I, invasive ductal carcinoma of the right breast."  Her subsequent history is as detailed below   PAST MEDICAL HISTORY: Past Medical History:  Diagnosis Date  . Bone metastases ([place]) dx'd 05/2014  . Breast cancer ([place]) dx'd 2005/2011  . Peripheral vascular disease ([place]) 02/2010   blood clot related to porta cath  . PONV (postoperative nausea and vomiting)   . S/P radiation therapy 07/17/2014 through 08/02/2014    Left mediastinum, left seventh rib 3250 cGy in 13 sessions   . S/P radiation therapy 12/11/2014 through 12/22/2014    Left parietal calvarium 2400 cGy in 8 sessions   . Seizures ([place]) 2010   Isolated incident.    PAST SURGICAL HISTORY: Past Surgical History:  Procedure Laterality Date  . AXILLARY LYMPH NODE DISSECTION  Dec. 2011  . BREAST LUMPECTOMY  2005  . IR EXCHANGE BILIARY DRAIN  09/18/2016  . IR FLUORO GUIDE CV LINE LEFT  09/05/2016  . IR GENERIC HISTORICAL  05/21/2016   IR RADIOLOGIST EVAL & MGMT 05/21/2016 [name] Mariscal, MD [facility] INTERV RAD  . IR INT EXT BILIARY DRAIN WITH CHOLANGIOGRAM  09/06/2016  . IR US GUIDE VASC ACCESS LEFT  09/05/2016  . MEDIASTINOTOMY CHAMBERLAIN MCNEIL Left 06/02/2013   Procedure: MEDIASTINOTOMY CHAMBERLAIN MCNEIL;  Surgeon: [name] Nakayama, MD;  Location: [facility];  Service: Thoracic;  Laterality: Left;  LEFT ANTERIOR MEDIASTINOTOMY   . PORTACATH PLACEMENT  12/11  . removal portacath      FAMILY HISTORY Family History  Problem Relation Age of Onset  . COPD Mother   . Breast cancer Sister 17   The patient's father is living, 25 years old as of may 2015. He lives in [place]. The patient's mother died from complications of COPD at the age of 12. These has 2 brothers, one sister. Her sister developed breast  cancer at the age of 64. She is doing well. The patient herself underwent genetic testing at [facility] in 2011 and was found to be BRCA negative  GYNECOLOGIC HISTORY:  Menarche age 20, she is GX P0. She stopped having periods with her initial chemotherapy in 2006.  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 Her husband [name] is a Medical illustrator in [place]. He has a child from a prior marriage. At home they have 2 rescue dogs, Hobo and Lemay. The patient is religious but not a church attender    ADVANCED DIRECTIVES: In place; at the 08/04/2014 visit in particular the patient was very clear, with her husband present, that she would not want any kind of feeding tubes or "other tubes" if her condition deteriorated.   HEALTH MAINTENANCE: Social History  Substance Use Topics  . Smoking status: Never Smoker  . Smokeless tobacco: Never Used  . Alcohol use No     Colonoscopy:  PAP:  Bone density: March 2015; mild osteopenia  Lipid panel:  Allergies  Allergen Reactions  . 2nd Skin Quick Heal Other (See Comments)    Other Reaction: Skin peels  . Decadron [  Dexamethasone] Other (See Comments)    Patient does not tolerate steroids.   . Dilaudid [Hydromorphone] Nausea And Vomiting  . Enoxaparin Other (See Comments)    unknown  . Fluconazole Swelling    Liver toxicity  . Hydromorphone Hcl Nausea And Vomiting  . Morphine And Related Nausea And Vomiting  . Ondansetron Other (See Comments)    "makes me loopy"  . Protonix [Pantoprazole Sodium] Other (See Comments)    Patient reports it caused thrush.  . Tegaderm Ag Mesh [[place]]     Current Outpatient Prescriptions  Medication Sig Dispense Refill  . alum & mag hydroxide-simeth (MAALOX/MYLANTA) 200-200-20 MG/5ML suspension Take 30 mLs by mouth every 6 (six) hours as needed for indigestion, heartburn or flatulence (Bloating,gas pain). 355 mL 0  . B Complex-C (B-COMPLEX  WITH VITAMIN C) tablet Take 1 tablet by mouth daily. Reported on 03/27/2015    . betamethasone valerate ointment (VALISONE) 0.1 % Apply 1 application topically 2 (two) times daily. 30 g 0  . calcium-vitamin D (OSCAL WITH D) 500-200 MG-UNIT tablet Take 2 tablets by mouth 2 (two) times daily. 120 tablet 6  . cholecalciferol 2000 UNITS tablet Take 1 tablet (2,000 Units total) by mouth daily.    Marland Kitchen docusate sodium (COLACE) 100 MG capsule Take 1 capsule (100 mg total) by mouth 2 (two) times daily. 10 capsule 0  . famotidine (PEPCID) 40 MG tablet Take 0.5-1 tablets (20-40 mg total) by mouth 2 (two) times daily. 40 mg in the morning and 20 mg in the evening (Patient taking differently: Take 40 mg by mouth 2 (two) times daily. ) 30 tablet 1  . famotidine (PEPCID) 40 MG tablet Take 1 tablet (40 mg total) by mouth 2 (two) times daily. 60 tablet 3  . fentaNYL (ACTIQ) 200 MCG lollipop Place 200 mcg inside cheek every 4 (four) hours as needed for pain. 30 tablet 0  . folic acid (FOLVITE) 1 MG tablet Take 1 tablet (1 mg total) by mouth daily. 90 tablet 2  . ketorolac (TORADOL) 10 MG tablet Take 1 tablet (10 mg total) by mouth every 6 (six) hours as needed. (Patient taking differently: Take 10 mg by mouth every 6 (six) hours. ) 120 tablet 6  . LORazepam (ATIVAN) 0.5 MG tablet Take 1 tablet (0.5 mg total) by mouth every 8 (eight) hours as needed for anxiety. 30 tablet 0  . Melatonin 3 MG TABS Take 3 mg by mouth at bedtime.    . polyethylene glycol (MIRALAX / GLYCOLAX) packet Take 17 g by mouth daily. 14 each 0  . promethazine (PHENERGAN) 12.5 MG tablet Take 1 tablet (12.5 mg total) by mouth every 6 (six) hours as needed for nausea. (Patient not taking: Reported on 09/18/2016) 30 tablet 0  . saccharomyces boulardii (FLORASTOR) 250 MG capsule Take 250 mg by mouth daily.     . traMADol (ULTRAM) 50 MG tablet Take 1-2 tablets (50-100 mg total) by mouth every 6 (six) hours as needed. (Patient taking differently: Take 50-100  mg by mouth every 6 (six) hours. ) 120 tablet 0  . triamterene-hydrochlorothiazide (DYAZIDE) 37.5-25 MG capsule Take 1 each (1 capsule total) by mouth daily as needed (weight gain). 30 capsule 6   No current facility-administered medications for this visit.     OBJECTIVE: Middle-aged white womanExamined in a wheelchair  There were no vitals filed for this visit.   There is no height or weight on file to calculate BMI.   Patient refused vitals 10/08/16  ECOG FS: 2  Scleral icterus, pupils round and equal Oropharynx clear and moist No cervical or supraclavicular adenopathy Lungs no rales or rhonchi Heart regular rate and rhythm Abd soft, nontender, biliary drain bandage clean and dry MSK no focal spinal tenderness Neuro: nonfocal, well oriented, appropriate affect Breasts: Deferred    LAB RESULTS:   CMP     Component Value Date/Time   NA 116 (L) 10/08/2016 1505   K 4.5 10/08/2016 1505   CL 88 (L) 09/21/2016 0414   CL 105 05/06/2012 1333   CO2 22 10/08/2016 1505   GLUCOSE 98 10/08/2016 1505   GLUCOSE 124 (H) 05/06/2012 1333   BUN 20.9 10/08/2016 1505   CREATININE 1.4 (H) 10/08/2016 1505   CALCIUM 8.3 (L) 10/08/2016 1505   PROT 4.5 (L) 10/08/2016 1505   ALBUMIN 1.7 (L) 10/08/2016 1505   AST 197 (HH) 10/08/2016 1505   ALT 81 (H) 10/08/2016 1505   ALKPHOS 647 (H) 10/08/2016 1505   BILITOT 18.75 (HH) 10/08/2016 1505   GFRNONAA >60 09/21/2016 0414   GFRAA >60 09/21/2016 0414    No results found for: SPEP  Lab Results  Component Value Date   WBC 10.5 (H) 10/08/2016   NEUTROABS 9.0 (H) 10/08/2016   HGB 7.6 (L) 10/08/2016   HCT 21.4 (L) 10/08/2016   MCV 93.9 10/08/2016   PLT 377 10/08/2016      Chemistry      Component Value Date/Time   NA 116 (L) 10/08/2016 1505   K 4.5 10/08/2016 1505   CL 88 (L) 09/21/2016 0414   CL 105 05/06/2012 1333   CO2 22 10/08/2016 1505   BUN 20.9 10/08/2016 1505   CREATININE 1.4 (H) 10/08/2016 1505      Component Value  Date/Time   CALCIUM 8.3 (L) 10/08/2016 1505   ALKPHOS 647 (H) 10/08/2016 1505   AST 197 (HH) 10/08/2016 1505   ALT 81 (H) 10/08/2016 1505   BILITOT 18.75 (HH) 10/08/2016 1505     No results for input(s): INR in the last 168 hours.  Urinalysis    Component Value Date/Time   COLORURINE AMBER (A) 08/26/2016 0334   APPEARANCEUR CLEAR 08/26/2016 0334   LABSPEC 1.015 08/26/2016 0334   LABSPEC 1.005 05/16/2016 1201   PHURINE 5.0 08/26/2016 0334   GLUCOSEU NEGATIVE 08/26/2016 0334   GLUCOSEU Negative 05/16/2016 1201   HGBUR SMALL (A) 08/26/2016 0334   BILIRUBINUR MODERATE (A) 08/26/2016 0334   BILIRUBINUR Negative 05/16/2016 1201   KETONESUR 20 (A) 08/26/2016 0334   PROTEINUR NEGATIVE 08/26/2016 0334   UROBILINOGEN 0.2 05/16/2016 1201   NITRITE NEGATIVE 08/26/2016 0334   LEUKOCYTESUR NEGATIVE 08/26/2016 0334   LEUKOCYTESUR Small 05/16/2016 1201     STUDIES: Ir Exchange Biliary Drain  Result Date: 09/18/2016 INDICATION: Metastatic breast carcinoma with biliary obstruction, status post internal/external drain catheter placement 09/06/2016. There was an initial improvement in the patient's bilirubin, which has since elevated above preprocedure level. EXAM: CHOLANGIOGRAM THROUGH EXISTING CATHETER EXCHANGE AND REVISION OF INTERNAL-EXTERNAL BILIARY DRAIN MEDICATIONS: Zosyn 3.375 g IV; The antibiotic was administered within an appropriate time frame prior to the initiation of the procedure. ANESTHESIA/SEDATION: Intravenous Fentanyl and Versed were administered as conscious sedation during continuous monitoring of the patient's level of consciousness and physiological / cardiorespiratory status by the radiology RN, with a total moderate sedation time of 15 minutes. PROCEDURE: Informed written consent was obtained from the patient after a thorough discussion of the procedural risks, benefits and alternatives. All questions were addressed. Maximal Sterile Barrier Technique  was utilized including  caps, mask, sterile gowns, sterile gloves, sterile drape, hand hygiene and skin antiseptic. A timeout was performed prior to the initiation of the procedure. Lidocaine 1% was administered subcutaneously around the catheter. Cholangiogram through the existing internal -external biliary drain catheter was performed. The catheter was cut and exchanged over a Bentson wire for a 5 [place] Kumpe catheter, advanced into the distal duodenum. This was then exchanged over an Amplatz wire for serial vascular dilators which facilitated advancement of a 12 [place] internal external biliary drain catheter, placed with side hole spanning the central left bile duct through the CBD into the duodenum. Catheter injection confirms appropriate positioning. The patient tolerated the procedure well. FLUOROSCOPY TIME:  2.2 minutes, 338  uGym2 DAP COMPLICATIONS: None immediate. FINDINGS: The initial cholangiogram demonstrated stable position of the previously placed 8 [place] internal external drain catheter with additional sideholes. The left biliary tree was decompressed. The posterior right biliary tree was incompletely opacified but appear decompressed. The anterior right biliary tree was dilated centrally with focal high-grade stenosis near the confluence with the left duct. The catheter was exchanged and upsized to a 12 [place] internal -external drain catheter as above, without complication. IMPRESSION: 1. Continued patency of the previously placed biliary drain catheter with decompression of the left biliary system and a portion of the posterior right biliary tree. 2. Technically successful exchange and up-sizing to a 12 [place] internal external device, placed to external drainage. Electronically Signed   By: Lucrezia Europe M.D.   On: 09/18/2016 16:55    ASSESSMENT: 56 y.o. BRCA negative [place] woman with stage IV breast cancer, history as follows  (1)  S/p Right upper inner quadrant lumpectomy and sentinel lymph node sampling  03/15/2004 for a pT1c pN0. Stage IA invasive ductal carcinoma, grade 2, estrogen receptor 95% positive, progesterone receptor 65% positive, HER-2 not amplified; additional surgery 04/25/2004 for seroma or clearance showed no residual tumor  (2) adjuvant chemotherapy with cyclophosphamide and doxorubicin every 21 days x4 completed 07/19/2004  (3) adjuvant radiation given under Dr. Donella Stade in [place] completed July 2006  (4) the patient opted against adjuvant antiestrogen therapy  (5) genetics testing showed no BRCA mutations  (6) biopsy of a palpable right axillary mass 10/24/2009 showed invasive ductal carcinoma, grade 3, estrogen receptor 100% positive, progesterone receptor 2% positive (alert score 5) HER-2 negative; no evidence of systemic disease on PET scanning  (7) completed 3 of 4 planned cycles of docetaxel and cyclophosphamide September 2011, fourth cycle omitted because of marked elevations in liver function tests  (8) an right axillary lymph node dissection 03/06/2010 showed 3/8 lymph nodes removed to be involved by tumor, with extracapsular extension.  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, TTS-1 negative), estrogen receptor 80% positive, progesterone receptor 2% positive, HER-2 not amplified  (12) letrozole started March 2015-- tolerated with significant side effects, discontinued at the end of May 2015  (13) PET scan 08/16/2013 shows extensive left pleural metastatic disease and a large left pleural effusion that shifts cardiac and mediastinal structures to the right;  adenopathy (celiac trunk, periadrenal, periaortic); and a left medial clavicular lesion; Status post left thoracentesis 08/16/2013 positive for adenocarcinoma,  estrogen receptor positive, progesterone receptor negative.  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; everolimus was suggested as an available targeted agent  (18) exemestane started 03/31/2014, discontinued 10/31/2014 with evidence of progression  (a) everolimus added 04/03/2014 but not tolerated (cytopenias, elevated LFTs) even at minimal doses; stopped 04/17/2014  (19) fulvestrant started 12/20/2014  (a) palbociclib added at very low dose 04/03/2015 (starting dose 75 mg weekly)  (b) palbociclib dose gradually increased to 75 mg daily, 21/7, as of May 2017  (c) palbociclib dose increased to 100 mg daily, 21/ 7, beginning November mid- cycle  (d) palbociclib dose decreased to 75 mg daily beginning with cycle starting 05/25/2016  (e) letrozole 2.5 mg started 05/26/2016, held as of 07/25/2016 with poor tolerance  (f) palbociclib dose decreased to 75 mg every other day beginning 07/25/2016  (20) liver biopsy 03/20/2015 confirms metastatic carcinoma, still estrogen receptor positive at 100%, progesterone receptor negative, HER-2 equivocal with a signals ratio 1.41, number per cell 4.50.   (a) repeat liver biopsy December 2017 might show further changes (HER-2 positivity)?  (21) immunohistochemistry for mismatched repair protein mutations 03/20/2015 showed normal major and  minor MMR proteins, with a very low probability of microsatellite instability ([id number])  (22) adjuvant radiation 12/24/15-01/02/16 Site/dose:   1) Left T9 Rib / 24 Gy in 8 fx                         2) Right inferior pelvis/ 24 Gy in 8 fx  (23) pembrolizumab added to her treatment program 07/29/2016, discontinued after a single dose because of poor tolerance  (a) liver biopsy 03/20/2015 negative for PD-1   (b) TSH on 08/01/2016 was 4.1  (24) changed to palliative/comfort care as of May 2018, with severe intrahepatic cholestasis, status post transhepatic biliary drainage placement 09/06/2016, revised 09/18/2016  (a) distant hyperbilirubinemia and elevated transaminases  (b) ascites and hyponatremia     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 treated with Lovenox for 5-6 months  (c) right upper extremity lymphedema--receiving physical therapy  (d) hepatic steatosis with chronically elevated LFTs as well as unusual hepatic sensitivity to chemotherapy  (e) osteopenia with the lowest T score -1.6 on bone density scan 06/20/2013  (i) on denosumab/ Xgeva Q28d  (f) radiation oncology (Dr Valere Dross) has reviewed prior radiation records in case there is further mediastinal involvement with dysphagia etc in which case palliative XRT could be considered  (a) radiation to left mediastinum/ left 7th rib 3250 cGy in 13 sessions04/18/2016 through 08/02/2014  (b) radiation to T11 area: 22 Gy in 7 sessions, last dose 11/27/2014  (c) radiation left parietal scalp region to be completed 12/22/2014  (d) radiation to sacral area completed 04/09/2015  (e) radiation to right inferior pelvis and left ninth rib (24 gray, 12/24/2015--01/02/2016)  (f) T11 was treated stereotactically with 14 gray in 1 fraction 03/12/2016.  (g) chest wall and perineal pain--improved post radiation  treatments  (a) discussed celebrex/ carafate but demurs  (h) zoster  diagnosed 04/04/2015-- on valacyclovir--resolved   PLAN: Kandra is holding on, working very hard to try to get her bilirubin down in hopes that this might allow her to receive some treatment in the future.  The bilirubin actually has dropped 4 points. It is still very high of course. The albumin is stable although still very low. She has managed to stabilize the sodium although it is still under 120.   Her plan at present is to undergo further procedures to interventional radiology 10/10/2016 to get the biliary drainage to work optimally. It may be clogged by debris or it may need slight repositioning.  We reviewed again the importance of fluid restriction. The goal is to get the sodium above 120. We also talked about the salt restriction and she is not weighing herself every day. She really needs to do that as that will tell her just how much fluid she is holding onto and how she is doing as far as the salt intake is concerned.  She will see me again next week. She knows to call for any problems that may develop before the next visit here.    [name] Cruel, MD   10/08/2016 4:34 PM

## 2016-10-09 ENCOUNTER — Other Ambulatory Visit (HOSPITAL_COMMUNITY): Payer: Self-pay | Admitting: Radiology

## 2016-10-09 ENCOUNTER — Ambulatory Visit: Payer: 59 | Admitting: Physical Therapy

## 2016-10-09 DIAGNOSIS — M6289 Other specified disorders of muscle: Secondary | ICD-10-CM

## 2016-10-09 DIAGNOSIS — C50919 Malignant neoplasm of unspecified site of unspecified female breast: Secondary | ICD-10-CM

## 2016-10-09 DIAGNOSIS — K831 Obstruction of bile duct: Secondary | ICD-10-CM

## 2016-10-09 DIAGNOSIS — I890 Lymphedema, not elsewhere classified: Secondary | ICD-10-CM

## 2016-10-09 DIAGNOSIS — M62838 Other muscle spasm: Secondary | ICD-10-CM

## 2016-10-09 LAB — TSH: TSH: 8.786 m[IU]/L — AB (ref 0.308–3.960)

## 2016-10-09 NOTE — Therapy (Signed)
[place], [place], [address] Phone: [phone]   Fax:  [phone]  Physical Therapy Treatment  Patient Details  Name: [name] [name] MRN: [id number] Date of Birth: 08-15-1960 Referring Provider: Dr. Kyung Rudd  Encounter Date: 10/09/2016      PT End of Session - 10/09/16 1722    Visit Number 54  49 for lymph   Number of Visits 45  for lymphedema   Date for PT Re-Evaluation 12/08/16   PT Start Time 1525   PT Stop Time 1605   PT Time Calculation (min) 40 min   Activity Tolerance Patient tolerated treatment well      Past Medical History:  Diagnosis Date  . Bone metastases ([place]) dx'd 05/2014  . Breast cancer ([place]) dx'd 2005/2011  . Peripheral vascular disease ([place]) 02/2010   blood clot related to porta cath  . PONV (postoperative nausea and vomiting)   . S/P radiation therapy 07/17/2014 through 08/02/2014    Left mediastinum, left seventh rib 3250 cGy in 13 sessions   . S/P radiation therapy 12/11/2014 through 12/22/2014    Left parietal calvarium 2400 cGy in 8 sessions   . Seizures ([place]) 2010   Isolated incident.    Past Surgical History:  Procedure Laterality Date  . AXILLARY LYMPH NODE DISSECTION  Dec. 2011  . BREAST LUMPECTOMY  2005  . IR EXCHANGE BILIARY DRAIN  09/18/2016  . IR FLUORO GUIDE CV LINE LEFT  09/05/2016  . IR GENERIC HISTORICAL  05/21/2016   IR RADIOLOGIST EVAL & MGMT 05/21/2016 Sandi Mariscal, MD [facility] INTERV RAD  . IR INT EXT BILIARY DRAIN WITH CHOLANGIOGRAM  09/06/2016  . IR US GUIDE VASC ACCESS LEFT  09/05/2016  . MEDIASTINOTOMY CHAMBERLAIN MCNEIL Left 06/02/2013   Procedure: MEDIASTINOTOMY CHAMBERLAIN MCNEIL;  Surgeon: [name], MD;  Location: [facility];  Service: Thoracic;  Laterality: Left;  LEFT ANTERIOR  MEDIASTINOTOMY   . PORTACATH PLACEMENT  12/11  . removal portacath      There were no vitals filed for this visit.      Subjective Assessment - 10/09/16 1719    Subjective Pt states she is going to the cancer center tomorrow to have work done to unclog her drain    Currently in Pain? Yes   Pain Score 2    Pain Location Back                         [facility] Adult PT Treatment/Exercise - 10/09/16 0001      Manual Therapy   Manual Lymphatic Drainage (MLD) In supine with head of bed and feet elevated:  short neck, left axilla and anterior interaxillary anastomosis, right groin and axillo-inguinal anastomosis; right upper outer breast, directing towards pathways; then right UE from fingers to shoulder.   Other Manual Therapy In sitting, soft tissue work to neck and upper back, then to lower back, then focus again on neck and upper back with trigger point release at right middle and upper trap to patient tolerance.                  PT Short Term Goals - 02/12/16 1226      PT SHORT TERM GOAL #1   Title pain with walking decreased >/= 25%   Time 4   Period Weeks   Status Achieved           PT Long Term Goals - 08/05/16 1318  PT LONG TERM GOAL #1   Title indpendent with HEP   Time 8   Period Weeks   Status On-going     PT LONG TERM GOAL #2   Title pain with walking decreased >/= 75%   Time 8   Period Weeks   Status On-going     PT LONG TERM GOAL #3   Title ability to flex her right hip with right groin pain decreased >/= 50% due to improved tissue mobility   Time 8   Period Weeks   Status Achieved     PT LONG TERM GOAL #4   Title waking up in middle of night with pain decreased >/=50%   Time 8   Period Weeks   Status Achieved     PT LONG TERM GOAL #5   Title reduction of pain by end of day >/= 50% due to increase tissue mobility   Time 8   Period Weeks   Status On-going           Long Term Clinic Goals - 10/07/16 3818       CC Long Term Goal  #2   Title Pt. will report swelling is adequately managed to enable ADL function at a consistent level.   Baseline This refers to her right breast/axilla/UE lymphedema and not the other swelling that is current. However, this goal is deferred as patient now is unable to maintain function because of progression of cancer.   Status Deferred     CC Long Term Goal  #4   Title Pain/discomfort at right axilla area will be controlled at 6/10 or less.   Status On-going     CC Long Term Goal  #5   Title Patient will avoid infection by ongoing management of her lymphedema at right breast/axilla/upper arm areas.   Baseline She has not had any cellulitis related to lymphedema.   Status On-going            Plan - 10/09/16 1723    Clinical Impression Statement pt had fullness in between inisions on right upper lateral chest and posterior axilla/lateral chest that was decreased with MLD She has reported relief from muscle spasm and pain with soft tissue work    Rehab Potential Good   Clinical Impairments Affecting Rehab Potential end stage cancer; was referred to hospice but declined those services   PT Frequency 2x / week   PT Duration 12 weeks   PT Treatment/Interventions ADLs/Self Care Home Management;Therapeutic activities;Therapeutic exercise;Patient/family education;Manual techniques;Manual lymph drainage;Scar mobilization   PT Next Visit Plan manual lymph drainage, soft tissue, and myofascial release as needed    Consulted and Agree with Plan of Care Patient      Patient will benefit from skilled therapeutic intervention in order to improve the following deficits and impairments:     Visit Diagnosis: Lymphedema, not elsewhere classified  Muscle stiffness  Other muscle spasm     Problem List Patient Active Problem List   Diagnosis Date Noted  . Malnutrition of moderate degree ([place]) 09/22/2016  . Rigors 09/18/2016  . Cholestasis, intrahepatic 09/05/2016  .  Cholelithiasis   . Jaundice   . RUQ pain 08/26/2016  . Liver metastases ([place]) 02/23/2016  . 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 Zoster 04/04/2015  . Nausea with vomiting 11/18/2014  . Constipation 11/18/2014  . Left-sided thoracic back pain   . Bone metastases ([place]) 11/16/2014  . Back pain 11/15/2014  . Uncontrolled pain 11/14/2014  . Post-lymphadenectomy lymphedema of arm  05/31/2014  . Malignant neoplasm of upper-outer quadrant of right female breast ([place]) 05/02/2014  . Chest wall pain 03/21/2014  . Abnormal LFTs (liver function tests) 09/12/2013  . Malignant neoplasm of female breast ([place]) 08/18/2013  . 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 Owens Shark PT  [name] 10/09/2016, 5:26 PM  [place], [place], [address] Phone: [phone]   Fax:  [phone]  Name: [name] [name] MRN: [id number] Date of Birth: 1960/04/02

## 2016-10-10 ENCOUNTER — Encounter (HOSPITAL_COMMUNITY): Payer: Self-pay | Admitting: Interventional Radiology

## 2016-10-10 ENCOUNTER — Other Ambulatory Visit (HOSPITAL_COMMUNITY): Payer: Self-pay | Admitting: Radiology

## 2016-10-10 ENCOUNTER — Other Ambulatory Visit (HOSPITAL_COMMUNITY): Payer: Self-pay | Admitting: Interventional Radiology

## 2016-10-10 ENCOUNTER — Other Ambulatory Visit: Payer: Self-pay | Admitting: *Deleted

## 2016-10-10 ENCOUNTER — Telehealth: Payer: Self-pay | Admitting: *Deleted

## 2016-10-10 ENCOUNTER — Ambulatory Visit (HOSPITAL_COMMUNITY)
Admission: RE | Admit: 2016-10-10 | Discharge: 2016-10-10 | Disposition: A | Discharge status: Home / Self Care | Payer: 59 | Source: Ambulatory Visit | Attending: Radiology | Admitting: Radiology

## 2016-10-10 DIAGNOSIS — Z4682 Encounter for fitting and adjustment of non-vascular catheter: Secondary | ICD-10-CM | POA: Insufficient documentation

## 2016-10-10 DIAGNOSIS — K831 Obstruction of bile duct: Secondary | ICD-10-CM | POA: Diagnosis not present

## 2016-10-10 DIAGNOSIS — C50919 Malignant neoplasm of unspecified site of unspecified female breast: Secondary | ICD-10-CM

## 2016-10-10 HISTORY — PX: IR CHOLANGIOGRAM EXISTING TUBE: IMG6040

## 2016-10-10 MED ORDER — IOPAMIDOL (ISOVUE-300) INJECTION 61%
INTRAVENOUS | Status: AC
  Filled 2016-10-10: qty 50

## 2016-10-10 MED ORDER — IOPAMIDOL (ISOVUE-300) INJECTION 61%
INTRAVENOUS | Status: DC | PRN
  Administered 2016-10-10: 10 mL

## 2016-10-13 ENCOUNTER — Other Ambulatory Visit (HOSPITAL_COMMUNITY): Payer: Self-pay | Admitting: Interventional Radiology

## 2016-10-13 ENCOUNTER — Ambulatory Visit: Payer: 59 | Admitting: Physical Therapy

## 2016-10-13 ENCOUNTER — Ambulatory Visit (HOSPITAL_COMMUNITY)
Admission: RE | Admit: 2016-10-13 | Discharge: 2016-10-13 | Disposition: A | Discharge status: Home / Self Care | Payer: 59 | Source: Ambulatory Visit | Attending: Interventional Radiology | Admitting: Interventional Radiology

## 2016-10-13 DIAGNOSIS — K831 Obstruction of bile duct: Secondary | ICD-10-CM

## 2016-10-13 DIAGNOSIS — C50919 Malignant neoplasm of unspecified site of unspecified female breast: Secondary | ICD-10-CM

## 2016-10-13 NOTE — Progress Notes (Signed)
Patient ID: [name] [name], female   DOB: 25-Aug-1960, 56 y.o.   MRN: [id number]      Chief Complaint: Leaking Biliary Drainage Catheter  Referring Physician(s): Magrinat  Patient Status: [facility] - Out-pt  History of Present Illness: [name] [name] is a 56 y.o. female with past medical history significant for metastatic breast cancer who is well-known to the interventional radiology Department following successful placement of a percutaneous drainage catheter on 09/06/2016 with subsequent drainage catheter exchange and upsizing performed on 09/18/2016 for malignant obstructive jaundice.  Patient was most recently seen in the interventional radiology Department on 10/10/2016 with repeat cholangiogram demonstrating wide patency and appropriate functionality of existing biliary drainage catheter.  Unfortunately, the patient continues to report intermittent leakage around the biliary drainage catheter.   She is again accompanied by her husband though serves as her own historian.  Patient is overall tolerating the biliary drainage catheter well, however has multiple questions and concerns regarding catheter flushing and dressing changes.   Past Medical History:  Diagnosis Date  . Bone metastases ([place]) dx'd 05/2014  . Breast cancer ([place]) dx'd 2005/2011  . Peripheral vascular disease ([place]) 02/2010   blood clot related to porta cath  . PONV (postoperative nausea and vomiting)   . S/P radiation therapy 07/17/2014 through 08/02/2014    Left mediastinum, left seventh rib 3250 cGy in 13 sessions   . S/P radiation therapy 12/11/2014 through 12/22/2014    Left parietal calvarium 2400 cGy in 8 sessions   . Seizures ([place]) 2010   Isolated incident.    Past Surgical History:  Procedure Laterality Date  . AXILLARY LYMPH NODE DISSECTION  Dec. 2011  .  BREAST LUMPECTOMY  2005  . IR CHOLANGIOGRAM EXISTING TUBE  10/10/2016  . IR EXCHANGE BILIARY DRAIN  09/18/2016  . IR FLUORO GUIDE CV LINE LEFT  09/05/2016  . IR GENERIC HISTORICAL  05/21/2016   IR RADIOLOGIST EVAL & MGMT 05/21/2016 Sandi Mariscal, MD [facility] INTERV RAD  . IR INT EXT BILIARY DRAIN WITH CHOLANGIOGRAM  09/06/2016  . IR US GUIDE VASC ACCESS LEFT  09/05/2016  . MEDIASTINOTOMY CHAMBERLAIN MCNEIL Left 06/02/2013   Procedure: MEDIASTINOTOMY CHAMBERLAIN MCNEIL;  Surgeon: [name], MD;  Location: [facility];  Service: Thoracic;  Laterality: Left;  LEFT ANTERIOR MEDIASTINOTOMY   . PORTACATH PLACEMENT  12/11  . removal portacath      Allergies: 2nd skin quick heal; Decadron [dexamethasone]; Dilaudid [hydromorphone]; Enoxaparin; Fluconazole; Hydromorphone hcl; Morphine and related; Ondansetron; Protonix [pantoprazole sodium]; and Tegaderm ag mesh [silver]  Medications: Prior to Admission medications   Medication Sig Start Date End Date Taking? Authorizing Provider  alum & mag hydroxide-simeth (MAALOX/MYLANTA) 200-200-20 MG/5ML suspension Take 30 mLs by mouth every 6 (six) hours as needed for indigestion, heartburn or flatulence (Bloating,gas pain). 09/11/16   Magrinat, Virgie Dad, MD  B Complex-C (B-COMPLEX WITH VITAMIN C) tablet Take 1 tablet by mouth daily. Reported on 03/27/2015 01/19/15   Magrinat, Virgie Dad, MD  betamethasone valerate ointment (VALISONE) 0.1 % Apply 1 application topically 2 (two) times daily. 09/15/16   Magrinat, Virgie Dad, MD  calcium-vitamin D (OSCAL WITH D) 500-200 MG-UNIT tablet Take 2 tablets by mouth 2 (two) times daily. 09/11/16   Magrinat, Virgie Dad, MD  cholecalciferol 2000 UNITS tablet Take 1 tablet (2,000 Units total) by mouth daily. 01/19/15   Magrinat, Virgie Dad, MD  docusate sodium (COLACE) 100 MG capsule Take 1 capsule (100 mg total) by mouth 2 (two) times daily. 09/11/16   Magrinat, Sarajane Jews  C, MD  famotidine (PEPCID) 40 MG tablet Take 0.5-1 tablets (20-40 mg  total) by mouth 2 (two) times daily. 40 mg in the morning and 20 mg in the evening Patient taking differently: Take 40 mg by mouth 2 (two) times daily.  08/30/16   Caren Griffins, MD  famotidine (PEPCID) 40 MG tablet Take 1 tablet (40 mg total) by mouth 2 (two) times daily. 09/21/16   Volanda Napoleon, MD  fentaNYL (ACTIQ) 200 MCG lollipop Place 200 mcg inside cheek every 4 (four) hours as needed for pain. 09/23/16   Magrinat, Virgie Dad, MD  folic acid (FOLVITE) 1 MG tablet Take 1 tablet (1 mg total) by mouth daily. 09/21/16   Volanda Napoleon, MD  ketoconazole (NIZORAL) 2 % cream Apply 1 application topically daily. 10/08/16   Magrinat, Virgie Dad, MD  ketorolac (TORADOL) 10 MG tablet Take 1 tablet (10 mg total) by mouth every 6 (six) hours as needed. Patient taking differently: Take 10 mg by mouth every 6 (six) hours.  09/11/16   Magrinat, Virgie Dad, MD  LORazepam (ATIVAN) 0.5 MG tablet Take 1 tablet (0.5 mg total) by mouth every 8 (eight) hours as needed for anxiety. 08/30/16 08/30/17  Caren Griffins, MD  Melatonin 3 MG TABS Take 3 mg by mouth at bedtime.    [provider]  polyethylene glycol (MIRALAX / GLYCOLAX) packet Take 17 g by mouth daily. 09/11/16   Magrinat, Virgie Dad, MD  promethazine (PHENERGAN) 12.5 MG tablet Take 1 tablet (12.5 mg total) by mouth every 6 (six) hours as needed for nausea. Patient not taking: Reported on 09/18/2016 09/11/16   Magrinat, Virgie Dad, MD  saccharomyces boulardii (FLORASTOR) 250 MG capsule Take 250 mg by mouth daily.  03/23/15   Magrinat, Virgie Dad, MD  traMADol (ULTRAM) 50 MG tablet Take 1-2 tablets (50-100 mg total) by mouth every 6 (six) hours as needed. Patient taking differently: Take 50-100 mg by mouth every 6 (six) hours.  09/11/16   Magrinat, Virgie Dad, MD  triamterene-hydrochlorothiazide (DYAZIDE) 37.5-25 MG capsule Take 1 each (1 capsule total) by mouth daily as needed (weight gain). 09/15/16   Magrinat, Virgie Dad, MD     Family History  Problem Relation  Age of Onset  . COPD Mother   . Breast cancer Sister 53    Social History   Social History  . Marital status: Married    Spouse name: N/A  . Number of children: N/A  . Years of education: N/A   Social History Main Topics  . Smoking status: Never Smoker  . Smokeless tobacco: Never Used  . Alcohol use No  . Drug use: No  . Sexual activity: Yes   Other Topics Concern  . Not on file   Social History Narrative  . No narrative on file    ECOG Status: 3 - Symptomatic, >50% confined to bed    Review of Systems  Gastrointestinal: Negative for abdominal distention.  Skin: Positive for color change.    Vital Signs: There were no vitals taken for this visit.  Physical Exam  Eyes:  Scleral icterus.  Abdominal:    Location of the patient's drainage catheter.  The surrounding skin is without erythema, warmth or fluctuance.  Flushing of the drainage catheter was negative for evidence of pericatheter leakage.  Skin:  Diffusely jaundice  Psychiatric: She has a normal mood and affect. Her behavior is normal.    Mallampati Score:     Imaging: Ir Cholangiogram Existing Tube  Result Date: 10/10/2016 INDICATION: METASTATIC BREAST CANCER TO THE LIVER WITH OBSTRUCTIVE JAUNDICE, STATUS POST LEFT INTERNAL EXTERNAL BILIARY DRAIN, LEAKAGE AT THE SKIN SITE EXAM: CHOLANGIOGRAM THROUGH EXISTING INTERNAL EXTERNAL BILIARY DRAIN MEDICATIONS: NONE. ANESTHESIA/SEDATION: None. FLUOROSCOPY TIME:  Fluoroscopy Time:  12 seconds (4 mGy). COMPLICATIONS: None immediate. PROCEDURE: Informed written consent was obtained from the patient after a thorough discussion of the procedural risks, benefits and alternatives. All questions were addressed. Maximal Sterile Barrier Technique was utilized including caps, mask, sterile gowns, sterile gloves, sterile drape, hand hygiene and skin antiseptic. A timeout was performed prior to the initiation of the procedure. Under sterile conditions, the existing left  internal external 12 [place] biliary drain was injected with contrast easily. Imaging performed. Biliary drain is widely patent. Contrast easily flowed through the tube into the duodenum. Minimal reflux of contrast into the bile ducts at the biliary confluence. No evidence of tubal occlusion or obstruction. Stable drain position. IMPRESSION: Stable position of the left internal external 12 [place] biliary drain without occlusion or obstruction. Electronically Signed   By: Jerilynn Mages.  Shick M.D.   On: 10/10/2016 12:50   Ir Exchange Biliary Drain  Result Date: 09/18/2016 INDICATION: Metastatic breast carcinoma with biliary obstruction, status post internal/external drain catheter placement 09/06/2016. There was an initial improvement in the patient's bilirubin, which has since elevated above preprocedure level. EXAM: CHOLANGIOGRAM THROUGH EXISTING CATHETER EXCHANGE AND REVISION OF INTERNAL-EXTERNAL BILIARY DRAIN MEDICATIONS: Zosyn 3.375 g IV; The antibiotic was administered within an appropriate time frame prior to the initiation of the procedure. ANESTHESIA/SEDATION: Intravenous Fentanyl and Versed were administered as conscious sedation during continuous monitoring of the patient's level of consciousness and physiological / cardiorespiratory status by the radiology RN, with a total moderate sedation time of 15 minutes. PROCEDURE: Informed written consent was obtained from the patient after a thorough discussion of the procedural risks, benefits and alternatives. All questions were addressed. Maximal Sterile Barrier Technique was utilized including caps, mask, sterile gowns, sterile gloves, sterile drape, hand hygiene and skin antiseptic. A timeout was performed prior to the initiation of the procedure. Lidocaine 1% was administered subcutaneously around the catheter. Cholangiogram through the existing internal -external biliary drain catheter was performed. The catheter was cut and exchanged over a Bentson wire for a 5  [place] Kumpe catheter, advanced into the distal duodenum. This was then exchanged over an Amplatz wire for serial vascular dilators which facilitated advancement of a 12 [place] internal external biliary drain catheter, placed with side hole spanning the central left bile duct through the CBD into the duodenum. Catheter injection confirms appropriate positioning. The patient tolerated the procedure well. FLUOROSCOPY TIME:  2.2 minutes, 277  uGym2 DAP COMPLICATIONS: None immediate. FINDINGS: The initial cholangiogram demonstrated stable position of the previously placed 8 [place] internal external drain catheter with additional sideholes. The left biliary tree was decompressed. The posterior right biliary tree was incompletely opacified but appear decompressed. The anterior right biliary tree was dilated centrally with focal high-grade stenosis near the confluence with the left duct. The catheter was exchanged and upsized to a 12 [place] internal -external drain catheter as above, without complication. IMPRESSION: 1. Continued patency of the previously placed biliary drain catheter with decompression of the left biliary system and a portion of the posterior right biliary tree. 2. Technically successful exchange and up-sizing to a 12 [place] internal external device, placed to external drainage. Electronically Signed   By: Lucrezia Europe M.D.   On: 09/18/2016 16:55    Labs:  CBC:  Recent  Labs  09/21/16 0414 09/23/16 1141 09/29/16 1418 10/08/16 1505  WBC 9.1 17.9* 12.9* 10.5*  HGB 8.3* 8.3* 8.6* 7.6*  HCT 22.3* 23.2* 24.0* 21.4*  PLT 354 377 433* 377    COAGS:  Recent Labs  08/28/16 0721 08/29/16 0508 09/05/16 1148 09/18/16 1316  INR 1.07 1.11 1.30* 1.34    BMP:  Recent Labs  09/18/16 1316 09/19/16 0323 09/20/16 0834 09/21/16 0414 09/23/16 1141 09/29/16 1418 10/08/16 1505  NA 122* 124* 120* 122* 122* 115* 116*  K 4.3 4.6 4.2 4.2 3.9 4.0 4.5  CL 87* 89* 86* 88*  --   --   --   CO2 26  25 25 25 23  21* 22  GLUCOSE 91 97 98 102* 93 101 98  BUN 12 13 13 13  13.5 17.2 20.9  CALCIUM 8.3* 8.2* 8.2* 8.2* 8.0* 7.7* 8.3*  CREATININE 0.62 0.73 0.74 0.59 0.8 1.0 1.4*  GFRNONAA >60 >60 >60 >60  --   --   --   GFRAA >60 >60 >60 >60  --   --   --     LIVER FUNCTION TESTS:  Recent Labs  09/21/16 0414 09/23/16 1141 09/29/16 1418 10/08/16 1505  BILITOT 21.9* 21.10 Result Confirmed by Automated Dilution.* 22.43* 18.75*  AST 294* 200* 229* 197*  ALT 188* 150* 108* 81*  ALKPHOS 643* 699* 729* 647*  PROT 4.8* 4.4* 4.5* 4.5*  ALBUMIN 2.0* 1.6* 1.7* 1.7*    TUMOR MARKERS: No results for input(s): AFPTM, CEA, CA199, CHROMGRNA in the last 8760 hours.  Assessment and Plan:  [name] [name] is a 56 y.o. female with past medical history significant for metastatic breast cancer who is well-known to the interventional radiology Department following successful placement of a percutaneous drainage catheter on 09/06/2016 with subsequent drainage catheter exchange and upsizing performed on 09/18/2016 for malignant obstructive jaundice.  Patient was most recently seen in the interventional radiology Department on 10/10/2016 with repeat cholangiogram demonstrating wide patency and appropriate functionality of existing biliary drainage catheter.  I am disappointed to report that the placement of the biliary drainage catheter has done little to improve her bilirubin level (preprocedural bilirubin was 19.4 on 6/9 and most recently 18.8 on 7/11), however the bilirubin has not significantly increased and as such continued tube maintenance is indicated at this time.  Given persistently elevated bilirubin levels, I am doubtful patient will ever be a candidate for liver directed therapy (most notably, Y-90 radioembolization).  In regards to her complaint of catheter leakage, I explained to the patient that the only treatment for drainage catheter leakage would be to perform a fluoroscopic guided drainage  catheter exchange.    PLAN:  - At this time, the patient does NOT wish to proceed with drainage catheter exchange and rather will continue to observe and manage for the next several days.  If the patient ultimately wishes to pursue drainage catheter exchange, this will be performed with conscious sedation.  - I explained that as a drainage catheter has been in place for several weeks, flushing it may be performed on a PRN basis.  - I explained that pericatheter leakage is an expected finding, especially given the extent of her known metastatic disease and as such, bandage changes may be performed as she deems appropriate.  - Lastly, we removed the stat lock device in hopes of decreasing skin irritation and discomfort.  Patient is overall pleased with this plan of care.    If not performed in the interval, the patient has  been scheduled for routine fluoroscopic guided drainage catheter exchange to be performed on August 27, again with conscious sedation.  She was encouraged to call the interventional radiology department and/or the IR clinic with any interval questions or concerns.  A copy of this report was sent to the requesting provider on this date.  Electronically Signed: Sandi Mariscal, MD 10/13/2016, 4:35 PM   I spent a total of 15 Minutes in face to face in clinical consultation, greater than 50% of which was counseling/coordinating care for Biliary drainage catheter evaluation and management.

## 2016-10-14 ENCOUNTER — Telehealth: Payer: Self-pay | Admitting: Emergency Medicine

## 2016-10-14 ENCOUNTER — Other Ambulatory Visit: Payer: Self-pay | Admitting: Oncology

## 2016-10-14 ENCOUNTER — Ambulatory Visit (HOSPITAL_BASED_OUTPATIENT_CLINIC_OR_DEPARTMENT_OTHER): Payer: 59

## 2016-10-14 ENCOUNTER — Other Ambulatory Visit: Payer: 59

## 2016-10-14 ENCOUNTER — Ambulatory Visit: Payer: 59 | Admitting: Physical Therapy

## 2016-10-14 ENCOUNTER — Other Ambulatory Visit (HOSPITAL_BASED_OUTPATIENT_CLINIC_OR_DEPARTMENT_OTHER): Payer: 59

## 2016-10-14 ENCOUNTER — Ambulatory Visit (HOSPITAL_BASED_OUTPATIENT_CLINIC_OR_DEPARTMENT_OTHER): Payer: 59 | Admitting: Oncology

## 2016-10-14 ENCOUNTER — Encounter: Payer: 59 | Admitting: Physical Therapy

## 2016-10-14 DIAGNOSIS — M8580 Other specified disorders of bone density and structure, unspecified site: Secondary | ICD-10-CM

## 2016-10-14 DIAGNOSIS — C787 Secondary malignant neoplasm of liver and intrahepatic bile duct: Secondary | ICD-10-CM

## 2016-10-14 DIAGNOSIS — C50411 Malignant neoplasm of upper-outer quadrant of right female breast: Secondary | ICD-10-CM

## 2016-10-14 DIAGNOSIS — C7951 Secondary malignant neoplasm of bone: Secondary | ICD-10-CM | POA: Diagnosis not present

## 2016-10-14 DIAGNOSIS — Z86718 Personal history of other venous thrombosis and embolism: Secondary | ICD-10-CM | POA: Diagnosis not present

## 2016-10-14 DIAGNOSIS — I890 Lymphedema, not elsewhere classified: Secondary | ICD-10-CM | POA: Diagnosis not present

## 2016-10-14 DIAGNOSIS — C771 Secondary and unspecified malignant neoplasm of intrathoracic lymph nodes: Secondary | ICD-10-CM

## 2016-10-14 DIAGNOSIS — Z170 Estrogen receptor positive status [ER+]: Secondary | ICD-10-CM | POA: Diagnosis not present

## 2016-10-14 DIAGNOSIS — G893 Neoplasm related pain (acute) (chronic): Secondary | ICD-10-CM

## 2016-10-14 DIAGNOSIS — C50211 Malignant neoplasm of upper-inner quadrant of right female breast: Secondary | ICD-10-CM

## 2016-10-14 DIAGNOSIS — E871 Hypo-osmolality and hyponatremia: Secondary | ICD-10-CM

## 2016-10-14 DIAGNOSIS — Z95828 Presence of other vascular implants and grafts: Secondary | ICD-10-CM

## 2016-10-14 LAB — CBC WITH DIFFERENTIAL/PLATELET
BASO%: 0.2 % (ref 0.0–2.0)
BASOS ABS: 0 10*3/uL (ref 0.0–0.1)
EOS ABS: 0.1 10*3/uL (ref 0.0–0.5)
EOS%: 0.7 % (ref 0.0–7.0)
HEMATOCRIT: 20.9 % — AB (ref 34.8–46.6)
HGB: 7.4 g/dL — ABNORMAL LOW (ref 11.6–15.9)
LYMPH%: 6 % — AB (ref 14.0–49.7)
MCH: 33.3 pg (ref 25.1–34.0)
MCHC: 35.4 g/dL (ref 31.5–36.0)
MCV: 94.1 fL (ref 79.5–101.0)
MONO#: 0.6 10*3/uL (ref 0.1–0.9)
MONO%: 6.2 % (ref 0.0–14.0)
NEUT%: 86.9 % — ABNORMAL HIGH (ref 38.4–76.8)
NEUTROS ABS: 8.1 10*3/uL — AB (ref 1.5–6.5)
NRBC: 0 % (ref 0–0)
PLATELETS: 423 10*3/uL — AB (ref 145–400)
RBC: 2.22 10*6/uL — ABNORMAL LOW (ref 3.70–5.45)
RDW: 16.1 % — AB (ref 11.2–14.5)
WBC: 9.4 10*3/uL (ref 3.9–10.3)
lymph#: 0.6 10*3/uL — ABNORMAL LOW (ref 0.9–3.3)

## 2016-10-14 LAB — COMPREHENSIVE METABOLIC PANEL
ALK PHOS: 727 U/L — AB (ref 40–150)
ALT: 70 U/L — ABNORMAL HIGH (ref 0–55)
ANION GAP: 12 meq/L — AB (ref 3–11)
AST: 182 U/L (ref 5–34)
Albumin: 1.7 g/dL — ABNORMAL LOW (ref 3.5–5.0)
BILIRUBIN TOTAL: 19.34 mg/dL — AB (ref 0.20–1.20)
BUN: 35.5 mg/dL — ABNORMAL HIGH (ref 7.0–26.0)
CALCIUM: 8.3 mg/dL — AB (ref 8.4–10.4)
CO2: 20 mEq/L — ABNORMAL LOW (ref 22–29)
CREATININE: 2.5 mg/dL — AB (ref 0.6–1.1)
Chloride: 90 mEq/L — ABNORMAL LOW (ref 98–109)
EGFR: 21 mL/min/{1.73_m2} — AB (ref >90)
Glucose: 106 mg/dl (ref 70–140)
Potassium: 4.4 mEq/L (ref 3.5–5.1)
Sodium: 122 mEq/L — ABNORMAL LOW (ref 136–145)
TOTAL PROTEIN: 4.5 g/dL — AB (ref 6.4–8.3)

## 2016-10-14 MED ORDER — SODIUM CHLORIDE 0.9% FLUSH
10.0000 mL | INTRAVENOUS | Status: DC | PRN
  Administered 2016-10-14: 10 mL via INTRAVENOUS
  Filled 2016-10-14: qty 10

## 2016-10-14 MED ORDER — HEPARIN SOD (PORK) LOCK FLUSH 100 UNIT/ML IV SOLN
500.0000 [IU] | Freq: Once | INTRAVENOUS | Status: AC
  Administered 2016-10-14: 500 [IU] via INTRAVENOUS
  Filled 2016-10-14: qty 5

## 2016-10-14 NOTE — Progress Notes (Signed)
[facility] contacted by this nurse. ADC informed of new order from md stating pt is to have optifoam for stage 1 pressure ulcer to left buttock. ADC informed this nurse it would be brought to her house tomorrow for the routine visit with patient. Pt also informed.

## 2016-10-14 NOTE — Patient Instructions (Signed)
PICC Home Guide °A peripherally inserted central catheter (PICC) is a long, thin, flexible tube that is inserted into a vein in the upper arm. It is a form of intravenous (IV) access. It is considered to be a "central" line because the tip of the PICC ends in a large vein in your chest. This large vein is called the superior vena cava (SVC). The PICC tip ends in the SVC because there is a lot of blood flow in the SVC. This allows medicines and IV fluids to be quickly distributed throughout the body. The PICC is inserted using a sterile technique by a specially trained nurse or physician. After the PICC is inserted, a chest X-ray exam is done to be sure it is in the correct place. °A PICC may be placed for different reasons, such as: °· To give medicines and liquid nutrition that can only be given through a central line. Examples are: °? Certain antibiotic treatments. °? Chemotherapy. °? Total parenteral nutrition (TPN). °· To take frequent blood samples. °· To give IV fluids and blood products. °· If there is difficulty placing a peripheral intravenous (PIV) catheter. ° °If taken care of properly, a PICC can remain in place for several months. A PICC can also allow a person to go home from the hospital early. Medicine and PICC care can be managed at home by a family member or home health care team. °What problems can happen when I have a PICC? °Problems with a PICC can occasionally occur. These may include the following: °· A blood clot (thrombus) forming in or at the tip of the PICC. This can cause the PICC to become clogged. A clot-dissolving medicine called tissue plasminogen activator (tPA) can be given through the PICC to help break up the clot. °· Inflammation of the vein (phlebitis) in which the PICC is placed. Signs of inflammation may include redness, pain at the insertion site, red streaks, or being able to feel a "cord" in the vein where the PICC is located. °· Infection in the PICC or at the insertion  site. Signs of infection may include fever, chills, redness, swelling, or pus drainage from the PICC insertion site. °· PICC movement (malposition). The PICC tip may move from its original position due to excessive physical activity, forceful coughing, sneezing, or vomiting. °· A break or cut in the PICC. It is important to not use scissors near the PICC. °· Nerve or tendon irritation or injury during PICC insertion. ° °What should I keep in mind about activities when I have a PICC? °· You may bend your arm and move it freely. If your PICC is near or at the bend of your elbow, avoid activity with repeated motion at the elbow. °· Rest at home for the remainder of the day following PICC line insertion. °· Avoid lifting heavy objects as instructed by your health care provider. °· Avoid using a crutch with the arm on the same side as your PICC. You may need to use a walker. °What should I know about my PICC dressing? °· Keep your PICC bandage (dressing) clean and dry to prevent infection. °? Ask your health care provider when you may shower. Ask your health care provider to teach you how to wrap the PICC when you do take a shower. °· Change the PICC dressing as instructed by your health care provider. °· Change your PICC dressing if it becomes loose or wet. °What should I know about PICC care? °· Check the PICC insertion   site daily for leakage, redness, swelling, or pain. °· Do not take a bath, swim, or use hot tubs when you have a PICC. Cover PICC line with clear plastic wrap and tape to keep it dry while showering. °· Flush the PICC as directed by your health care provider. Let your health care provider know right away if the PICC is difficult to flush or does not flush. Do not use force to flush the PICC. °· Do not use a syringe that is less than 10 mL to flush the PICC. °· Never pull or tug on the PICC. °· Avoid blood pressure checks on the arm with the PICC. °· Keep your PICC identification card with you at all  times. °· Do not take the PICC out yourself. Only a trained clinical professional should remove the PICC. °Get help right away if: °· Your PICC is accidentally pulled all the way out. If this happens, cover the insertion site with a bandage or gauze dressing. Do not throw the PICC away. Your health care provider will need to inspect it. °· Your PICC was tugged or pulled and has partially come out. Do not  push the PICC back in. °· There is any type of drainage, redness, or swelling where the PICC enters the skin. °· You cannot flush the PICC, it is difficult to flush, or the PICC leaks around the insertion site when it is flushed. °· You hear a "flushing" sound when the PICC is flushed. °· You have pain, discomfort, or numbness in your arm, shoulder, or jaw on the same side as the PICC. °· You feel your heart "racing" or skipping beats. °· You notice a hole or tear in the PICC. °· You develop chills or a fever. °This information is not intended to replace advice given to you by your health care provider. Make sure you discuss any questions you have with your health care provider. °Document Released: 09/21/2002 Document Revised: 10/05/2015 Document Reviewed: 01/07/2013 °[employer] Interactive Patient Education © 2017 [employer] ° °

## 2016-10-14 NOTE — Progress Notes (Signed)
[place]  [phone] [phone]     ID: [name] [name] OB: [birth year]  MR#: [id number]  OHY#:[id number]  PCP: Chauncey Cruel, MD GYN:  [name] SU:  OTHER MD: [name], [name], [name], [name], [name], Kyung Rudd   CHIEF COMPLAINT: Stage IV breast cancer  CURRENT TREATMENT: Palliative/ comfort care  INTERVAL HISTORY:   [name] [name] returns today for follow-up and treatment of her stage IV estrogen receptor positive breast cancer accompanied by her husband [name] [name]. She has had the biliary drain flushed and the dressing changed by radiology and the result is that it is draining much better. They're obtaining between 150 and 200 mL of bile daily. The result of this, per labs today, is that her total bilirubin is relatively stable. However growth of the masses in the liver has been noted in the course of the procedures. This is unfortunately expected  REVIEW OF SYiSTEMS: [name] [name] has developed worsening back pain. They obtained a "doughnut seed" hopefully to relieve pressure in the bottom, but it has done the opposite. She is very uncomfortable sitting on it. She has developed an erosion in her left gluteus area and she wanted me to look at that today. She tells me the antifungal cream we used for the yeast infection beneath her breasts has worked very well and she wonders if she can stop it at this point. She remains on fluid and salt restriction. All she is using for pain is started on, currently 1-1/2 tablets 4 times a day. She is not constipated. A detailed review of systems today was otherwise stable  BREAST CANCER HISTORY: From doctor [name]'s intake note 03/20/2004:  "The patient is a very pleasant 56 year old female, without significant past medical history.  Her family history is significant for a sister who at age 65 was diagnosed with invasive ductal carcinoma.  She is a breast cancer  survivor at age 77 now.  The patient states that she has never really had a screening mammogram until October 2005, when she felt that it was time for her to start having mammograms done on a yearly basis.  Therefore, on 01/26/04, she underwent a screening mammogram and an abnormality was detected in the upper outer right breast.  She, therefore, underwent spot compression views of both the right and the left breast.  The left breast revealed a well-defined mass in the upper outer left quadrant, present at the 2 o'clock position, measuring 1.8 cm, 6 cm from the nipple.  This, by ultrasound, was felt to be a simple cyst measuring 1.8 cm.  On the right breast, a spiculated mass was noted in the upper outer right quadrant.  The ultrasound revealed a shadowing irregular solid mass at the 10:30 position, 9 cm from the nipple, measuring 1.2 cm in greatest dimension, correlating with the spiculated mass seen on the mammogram.  The right axilla was negative ultrasonically.  Because of this, the patient underwent a needle biopsy of the right breast and the biopsy was positive invasive mammary carcinoma that showed features consistent with a high-grade invasive ductal carcinoma associated with desmoplastic stroma.  No in situ component was seen and no definite lymphovascular invasion was identified.  On the core biopsy, the tumor measured about 0.8 cm.  Because of this, she was seen by Dr. [name] and the patient was taken to the [facility] on March 15, 2004.  She underwent a right breast lumpectomy with sentinel node biopsy.  The final pathology revealed an invasive ductal carcinoma, measuring 1.7 cm, grade 2 of 3.  Margins were free of tumor.  Atypical lobular hyperplasia was noted.  One sentinel node was removed which was negative for metastatic disease.  The tumor was staged at T1c, N0 MX.  It was estrogen receptor positive, progesterone receptor positive.  HER-2/neu was 2+.  FISH was negative.  All margins were  free of tumor.  She is now seen in Medical Oncology for further evaluation and management of this newly diagnosed T1c, node negative, stage I, invasive ductal carcinoma of the right breast."  Her subsequent history is as detailed below   PAST MEDICAL HISTORY: Past Medical History:  Diagnosis Date  . Bone metastases ([name]) dx'd 05/2014  . Breast cancer ([name] [name]) dx'd 2005/2011  . Peripheral vascular disease ([name]) 02/2010   blood clot related to porta cath  . PONV (postoperative nausea and vomiting)   . S/P radiation therapy 07/17/2014 through 08/02/2014    Left mediastinum, left seventh rib 3250 cGy in 13 sessions   . S/P radiation therapy 12/11/2014 through 12/22/2014    Left parietal calvarium 2400 cGy in 8 sessions   . Seizures ([name] [name]) 2010   Isolated incident.    PAST SURGICAL HISTORY: Past Surgical History:  Procedure Laterality Date  . AXILLARY LYMPH NODE DISSECTION  Dec. 2011  . BREAST LUMPECTOMY  2005  . IR CHOLANGIOGRAM EXISTING TUBE  10/10/2016  . IR EXCHANGE BILIARY DRAIN  09/18/2016  . IR FLUORO GUIDE CV LINE LEFT  09/05/2016  . IR GENERIC HISTORICAL  05/21/2016   IR RADIOLOGIST EVAL & MGMT 05/21/2016 Sandi Mariscal, MD [facility] INTERV RAD  . IR INT EXT BILIARY DRAIN WITH CHOLANGIOGRAM  09/06/2016  . IR US GUIDE VASC ACCESS LEFT  09/05/2016  . MEDIASTINOTOMY CHAMBERLAIN MCNEIL Left 06/02/2013   Procedure: MEDIASTINOTOMY CHAMBERLAIN MCNEIL;  Surgeon: [name], MD;  Location: [facility];  Service: Thoracic;  Laterality: Left;  LEFT ANTERIOR MEDIASTINOTOMY   . PORTACATH PLACEMENT  12/11  . removal portacath      FAMILY HISTORY Family History  Problem Relation Age of Onset  . COPD Mother   . Breast cancer Sister 70   The patient's father is living, 30 years old as of may 2015. He lives in [place]. The patient's mother died from  complications of COPD at the age of 48. These has 2 brothers, one sister. Her sister developed breast cancer at the age of 8. She is doing well. The patient herself underwent genetic testing [facility] in 2011 and was found to be BRCA negative  GYNECOLOGIC HISTORY:  Menarche age 47, she is GX P0. She stopped having periods with her initial chemotherapy in 2006.  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 Her husband [name] is a Medical illustrator in [place]. He has a child from a prior marriage. At home they have 2 rescue dogs, Hobo and [place]. The patient is religious but not a church attender    ADVANCED DIRECTIVES: In place; at the 08/04/2014 visit in particular the patient was very clear, with her husband present, that she would not want any kind of feeding tubes or "other tubes" if her condition deteriorated.   HEALTH MAINTENANCE: Social History  Substance Use Topics  . Smoking status: Never Smoker  . Smokeless tobacco: Never Used  . Alcohol use No     Colonoscopy:  PAP:  Bone density:  March 2015; mild osteopenia  Lipid panel:  Allergies  Allergen Reactions  . 2nd Skin Quick Heal Other (See Comments)    Other Reaction: Skin peels  . Decadron [Dexamethasone] Other (See Comments)    Patient does not tolerate steroids.   . Dilaudid [Hydromorphone] Nausea And Vomiting  . Enoxaparin Other (See Comments)    unknown  . Fluconazole Swelling    Liver toxicity  . Hydromorphone Hcl Nausea And Vomiting  . Morphine And Related Nausea And Vomiting  . Ondansetron Other (See Comments)    "makes me loopy"  . Protonix [Pantoprazole Sodium] Other (See Comments)    Patient reports it caused thrush.  . Tegaderm Ag Mesh [Silver]     Current Outpatient Prescriptions  Medication Sig Dispense Refill  . alum & mag hydroxide-simeth (MAALOX/MYLANTA) 200-200-20 MG/5ML suspension Take 30 mLs by mouth every 6 (six) hours as  needed for indigestion, heartburn or flatulence (Bloating,gas pain). 355 mL 0  . B Complex-C (B-COMPLEX WITH VITAMIN C) tablet Take 1 tablet by mouth daily. Reported on 03/27/2015    . betamethasone valerate ointment (VALISONE) 0.1 % Apply 1 application topically 2 (two) times daily. 30 g 0  . calcium-vitamin D (OSCAL WITH D) 500-200 MG-UNIT tablet Take 2 tablets by mouth 2 (two) times daily. 120 tablet 6  . cholecalciferol 2000 UNITS tablet Take 1 tablet (2,000 Units total) by mouth daily.    Marland Kitchen docusate sodium (COLACE) 100 MG capsule Take 1 capsule (100 mg total) by mouth 2 (two) times daily. 10 capsule 0  . famotidine (PEPCID) 40 MG tablet Take 0.5-1 tablets (20-40 mg total) by mouth 2 (two) times daily. 40 mg in the morning and 20 mg in the evening (Patient taking differently: Take 40 mg by mouth 2 (two) times daily. ) 30 tablet 1  . famotidine (PEPCID) 40 MG tablet Take 1 tablet (40 mg total) by mouth 2 (two) times daily. 60 tablet 3  . fentaNYL (ACTIQ) 200 MCG lollipop Place 200 mcg inside cheek every 4 (four) hours as needed for pain. 30 tablet 0  . folic acid (FOLVITE) 1 MG tablet Take 1 tablet (1 mg total) by mouth daily. 90 tablet 2  . ketoconazole (NIZORAL) 2 % cream Apply 1 application topically daily. 15 g 0  . ketorolac (TORADOL) 10 MG tablet Take 1 tablet (10 mg total) by mouth every 6 (six) hours as needed. (Patient taking differently: Take 10 mg by mouth every 6 (six) hours. ) 120 tablet 6  . LORazepam (ATIVAN) 0.5 MG tablet Take 1 tablet (0.5 mg total) by mouth every 8 (eight) hours as needed for anxiety. 30 tablet 0  . Melatonin 3 MG TABS Take 3 mg by mouth at bedtime.    . polyethylene glycol (MIRALAX / GLYCOLAX) packet Take 17 g by mouth daily. 14 each 0  . promethazine (PHENERGAN) 12.5 MG tablet Take 1 tablet (12.5 mg total) by mouth every 6 (six) hours as needed for nausea. (Patient not taking: Reported on 09/18/2016) 30 tablet 0  . saccharomyces boulardii (FLORASTOR) 250 MG  capsule Take 250 mg by mouth daily.     . traMADol (ULTRAM) 50 MG tablet Take 1-2 tablets (50-100 mg total) by mouth every 6 (six) hours as needed. (Patient taking differently: Take 50-100 mg by mouth every 6 (six) hours. ) 120 tablet 0  . triamterene-hydrochlorothiazide (DYAZIDE) 37.5-25 MG capsule Take 1 each (1 capsule total) by mouth daily as needed (weight gain). 30 capsule 6   No current facility-administered  medications for this visit.     OBJECTIVE: Middle-aged white womanExamined in a wheelchair  There were no vitals filed for this visit.   There is no height or weight on file to calculate BMI.   Patient refused vitals 10/14/16    ECOG FS: 2  Marked scleral icterus, EOMs intact Oropharynx clear and moist No cervical or supraclavicular adenopathy Lungs no rales or rhonchi Heart regular rate and rhythm Abd soft, nontender, positive bowel sounds MSK no focal spinal tenderness, no upper extremity lymphedema Neuro: nonfocal, well oriented with some word finding confusion, anxious affect Breasts: The yeast infection in the inframammary fold on the left side has cleared Skin: In the left gluteal area slightly medially there is a 2 x 1 cm erosion, very shallow, with pink pace, no associated erythema or swelling.  LAB RESULTS:   CMP     Component Value Date/Time   NA 122 (L) 10/14/2016 1231   K 4.4 10/14/2016 1231   CL 88 (L) 09/21/2016 0414   CL 105 05/06/2012 1333   CO2 20 (L) 10/14/2016 1231   GLUCOSE 106 10/14/2016 1231   GLUCOSE 124 (H) 05/06/2012 1333   BUN 35.5 (H) 10/14/2016 1231   CREATININE 2.5 (H) 10/14/2016 1231   CALCIUM 8.3 (L) 10/14/2016 1231   PROT 4.5 (L) 10/14/2016 1231   ALBUMIN 1.7 (L) 10/14/2016 1231   AST 182 (HH) 10/14/2016 1231   ALT 70 (H) 10/14/2016 1231   ALKPHOS 727 (H) 10/14/2016 1231   BILITOT 19.34 (HH) 10/14/2016 1231   GFRNONAA >60 09/21/2016 0414   GFRAA >60 09/21/2016 0414    No results found for: SPEP  Lab Results  Component  Value Date   WBC 9.4 10/14/2016   NEUTROABS 8.1 (H) 10/14/2016   HGB 7.4 (L) 10/14/2016   HCT 20.9 (L) 10/14/2016   MCV 94.1 10/14/2016   PLT 423 (H) 10/14/2016      Chemistry      Component Value Date/Time   NA 122 (L) 10/14/2016 1231   K 4.4 10/14/2016 1231   CL 88 (L) 09/21/2016 0414   CL 105 05/06/2012 1333   CO2 20 (L) 10/14/2016 1231   BUN 35.5 (H) 10/14/2016 1231   CREATININE 2.5 (H) 10/14/2016 1231      Component Value Date/Time   CALCIUM 8.3 (L) 10/14/2016 1231   ALKPHOS 727 (H) 10/14/2016 1231   AST 182 (HH) 10/14/2016 1231   ALT 70 (H) 10/14/2016 1231   BILITOT 19.34 (HH) 10/14/2016 1231     No results for input(s): INR in the last 168 hours.  Urinalysis    Component Value Date/Time   COLORURINE AMBER (A) 08/26/2016 0334   APPEARANCEUR CLEAR 08/26/2016 0334   LABSPEC 1.015 08/26/2016 0334   LABSPEC 1.005 05/16/2016 1201   PHURINE 5.0 08/26/2016 0334   GLUCOSEU NEGATIVE 08/26/2016 0334   GLUCOSEU Negative 05/16/2016 1201   HGBUR SMALL (A) 08/26/2016 0334   BILIRUBINUR MODERATE (A) 08/26/2016 0334   BILIRUBINUR Negative 05/16/2016 1201   KETONESUR 20 (A) 08/26/2016 0334   PROTEINUR NEGATIVE 08/26/2016 0334   UROBILINOGEN 0.2 05/16/2016 1201   NITRITE NEGATIVE 08/26/2016 0334   LEUKOCYTESUR NEGATIVE 08/26/2016 0334   LEUKOCYTESUR Small 05/16/2016 1201     STUDIES: Ir Cholangiogram Existing Tube  Result Date: 10/10/2016 INDICATION: METASTATIC BREAST CANCER TO THE LIVER WITH OBSTRUCTIVE JAUNDICE, STATUS POST LEFT INTERNAL EXTERNAL BILIARY DRAIN, LEAKAGE AT THE SKIN SITE EXAM: CHOLANGIOGRAM THROUGH EXISTING INTERNAL EXTERNAL BILIARY DRAIN MEDICATIONS: NONE. ANESTHESIA/SEDATION: None. FLUOROSCOPY TIME:  Fluoroscopy Time:  12 seconds (4 mGy). COMPLICATIONS: None immediate. PROCEDURE: Informed written consent was obtained from the patient after a thorough discussion of the procedural risks, benefits and alternatives. All questions were addressed. Maximal  Sterile Barrier Technique was utilized including caps, mask, sterile gowns, sterile gloves, sterile drape, hand hygiene and skin antiseptic. A timeout was performed prior to the initiation of the procedure. Under sterile conditions, the existing left internal external 12 [place] biliary drain was injected with contrast easily. Imaging performed. Biliary drain is widely patent. Contrast easily flowed through the tube into the duodenum. Minimal reflux of contrast into the bile ducts at the biliary confluence. No evidence of tubal occlusion or obstruction. Stable drain position. IMPRESSION: Stable position of the left internal external 12 [place] biliary drain without occlusion or obstruction. Electronically Signed   By: Jerilynn Mages.  Shick M.D.   On: 10/10/2016 12:50   Ir Exchange Biliary Drain  Result Date: 09/18/2016 INDICATION: Metastatic breast carcinoma with biliary obstruction, status post internal/external drain catheter placement 09/06/2016. There was an initial improvement in the patient's bilirubin, which has since elevated above preprocedure level. EXAM: CHOLANGIOGRAM THROUGH EXISTING CATHETER EXCHANGE AND REVISION OF INTERNAL-EXTERNAL BILIARY DRAIN MEDICATIONS: Zosyn 3.375 g IV; The antibiotic was administered within an appropriate time frame prior to the initiation of the procedure. ANESTHESIA/SEDATION: Intravenous Fentanyl and Versed were administered as conscious sedation during continuous monitoring of the patient's level of consciousness and physiological / cardiorespiratory status by the radiology RN, with a total moderate sedation time of 15 minutes. PROCEDURE: Informed written consent was obtained from the patient after a thorough discussion of the procedural risks, benefits and alternatives. All questions were addressed. Maximal Sterile Barrier Technique was utilized including caps, mask, sterile gowns, sterile gloves, sterile drape, hand hygiene and skin antiseptic. A timeout was performed prior to the  initiation of the procedure. Lidocaine 1% was administered subcutaneously around the catheter. Cholangiogram through the existing internal -external biliary drain catheter was performed. The catheter was cut and exchanged over a Bentson wire for a 5 [place] Kumpe catheter, advanced into the distal duodenum. This was then exchanged over an Amplatz wire for serial vascular dilators which facilitated advancement of a 12 [place] internal external biliary drain catheter, placed with side hole spanning the central left bile duct through the CBD into the duodenum. Catheter injection confirms appropriate positioning. The patient tolerated the procedure well. FLUOROSCOPY TIME:  2.2 minutes, 086  uGym2 DAP COMPLICATIONS: None immediate. FINDINGS: The initial cholangiogram demonstrated stable position of the previously placed 8 [place] internal external drain catheter with additional sideholes. The left biliary tree was decompressed. The posterior right biliary tree was incompletely opacified but appear decompressed. The anterior right biliary tree was dilated centrally with focal high-grade stenosis near the confluence with the left duct. The catheter was exchanged and upsized to a 12 [place] internal -external drain catheter as above, without complication. IMPRESSION: 1. Continued patency of the previously placed biliary drain catheter with decompression of the left biliary system and a portion of the posterior right biliary tree. 2. Technically successful exchange and up-sizing to a 12 [place] internal external device, placed to external drainage. Electronically Signed   By: Lucrezia Europe M.D.   On: 09/18/2016 16:55    ASSESSMENT: 56 y.o. BRCA negative [place] woman with stage IV breast cancer,   (1)  S/p Right upper inner quadrant lumpectomy and sentinel lymph node sampling 03/15/2004 for a pT1c pN0. Stage IA invasive ductal carcinoma, grade 2, estrogen receptor 95% positive, progesterone receptor  65% positive, HER-2 not  amplified; additional surgery 04/25/2004 for seroma or clearance showed no residual tumor  (2) adjuvant chemotherapy with cyclophosphamide and doxorubicin every 21 days x4 completed 07/19/2004  (3) adjuvant radiation given under Dr. Donella Stade in [place] completed July 2006  (4) the patient opted against adjuvant antiestrogen therapy  (5) genetics testing showed no BRCA mutations  (6) biopsy of a palpable right axillary mass 10/24/2009 showed invasive ductal carcinoma, grade 3, estrogen receptor 100% positive, progesterone receptor 2% positive (alert score 5) HER-2 negative; no evidence of systemic disease on PET scanning  (7) completed 3 of 4 planned cycles of docetaxel and cyclophosphamide September 2011, fourth cycle omitted because of marked elevations in liver function tests  (8) an right axillary lymph node dissection 03/06/2010 showed 3/8 lymph nodes removed to be involved by tumor, with extracapsular extension.  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, TTS-1 negative), estrogen receptor 80% positive, progesterone receptor 2% positive, HER-2 not amplified  (12) letrozole started March 2015-- tolerated with significant side effects, discontinued at the end of May 2015  (13) PET scan 08/16/2013 shows extensive left pleural metastatic disease and a large left pleural effusion that shifts cardiac and mediastinal structures to the right; adenopathy (celiac trunk, periadrenal, periaortic); and a left medial clavicular lesion; Status post left thoracentesis 08/16/2013 positive for  adenocarcinoma, estrogen receptor positive, progesterone receptor negative.  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 mutation; everolimus was suggested as an available targeted agent  (18) exemestane started 03/31/2014, discontinued 10/31/2014 with evidence of progression  (a) everolimus added 04/03/2014 but not tolerated (cytopenias, elevated LFTs) even at minimal doses; stopped 04/17/2014  (19) fulvestrant started 12/20/2014  (a) palbociclib added at very low dose 04/03/2015 (starting dose 75 mg weekly)  (b) palbociclib dose gradually increased to 75 mg daily, 21/7, as of May 2017  (c) palbociclib dose increased to 100 mg daily, 21/ 7, beginning November mid- cycle  (d) palbociclib dose decreased to 75 mg daily beginning with cycle starting 05/25/2016  (e) letrozole 2.5 mg started 05/26/2016, held as of 07/25/2016 with poor tolerance  (f) palbociclib dose decreased to 75 mg every other day beginning 07/25/2016  (20) liver biopsy 03/20/2015 confirms metastatic carcinoma, still estrogen receptor positive at 100%, progesterone receptor negative, HER-2 equivocal with a signals ratio 1.41, number per cell 4.50.   (a) repeat liver biopsy December 2017 might show further changes (HER-2 positivity)?  (21) immunohistochemistry for mismatched repair protein mutations 03/20/2015 showed normal major and minor MMR proteins, with a very low probability of microsatellite instability ([id number])  (22) adjuvant radiation  12/24/15-01/02/16 Site/dose:   1) Left T9 Rib / 24 Gy in 8 fx  2) Right inferior pelvis/ 24 Gy in 8 fx  (23) pembrolizumab added to her treatment program 07/29/2016, discontinued after a single dose because of poor tolerance  (a) liver biopsy 03/20/2015 negative for PD-1   (b) TSH on 08/01/2016 was 4.1  (24) changed to palliative/comfort care as of May 2018, with severe intrahepatic cholestasis, status post transhepatic biliary drainage placement 09/06/2016, revised 09/18/2016  (a) distant hyperbilirubinemia and elevated transaminases  (b) ascites and hyponatremia     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 treated with Lovenox for 5-6 months  (c) right upper extremity lymphedema--receiving physical therapy  (d) hepatic steatosis with chronically elevated LFTs as well as unusual hepatic sensitivity to chemotherapy  (e) osteopenia with the lowest T score -1.6 on bone density scan 06/20/2013  (i) on denosumab/ Xgeva Q28d  (f) radiation oncology (Dr Valere Dross) has reviewed prior radiation records in case there is further mediastinal involvement with dysphagia etc in which case palliative XRT could be considered  (a) radiation to left mediastinum/ left 7th rib 3250 cGy in 13 sessions04/18/2016 through 08/02/2014  (b) radiation to T11 area: 22 Gy in 7 sessions, last dose 11/27/2014  (c) radiation left parietal scalp region to be completed 12/22/2014  (d) radiation to sacral area completed 04/09/2015  (e) radiation to right inferior pelvis and left ninth rib (24 gray, 12/24/2015--01/02/2016)  (f) T11 was treated stereotactically with 14 gray in 1 fraction 03/12/2016.  (g) chest wall and perineal pain--improved post radiation treatments  (a) discussed celebrex/ carafate but demurs  (h) zoster diagnosed 04/04/2015-- on  valacyclovir--resolved   PLAN: Rosealyn has a very shallow erosions, with pink base, in the left buttock area. The pain that she reports from this is not proportional to the lesion and I think what she is feeling may be more closely related to the discomfort she is experiencing from sitting on the "doughnut". We are stopping that at this point since it is clearly making her situation worse rather than better.  She continues on tramadol as her only pain medicine. She is afraid of using Tylenol because it goes through the liver and very reluctant--vehemently refusing--to use narcotics because she says these cause her horrible constipation nausea and vomiting.  The problem is that she is getting progressive renal insufficiency secondary to the Toradol. There could also be an element of hepatorenal syndrome and in addition of course she is fluid restricted because of her hyponatremia.  We are going to try to use less Toradol, and we are going to increase fluids a little, but this)'s is not going to be stable and either she will have progressive renal insufficiency or progressive hepatic insufficiency or uncontrollable pain in I think the near future. While she continues to hope for a miracle her husband [name] [name] is very aware of the terminal nature of these developments. If the situation at home becomes untenable the plan is to transition to hospice and likely to [facility].  In case of a terminal event Nehemiah does not desire to be resuscitated. This is appropriate as we are facing a terminal and irreversible condition. Her husband [name] [name] is in agreement with this decision.    Chauncey Cruel, MD   10/14/2016 9:00 PM

## 2016-10-15 ENCOUNTER — Ambulatory Visit: Payer: 59 | Admitting: Physical Therapy

## 2016-10-15 DIAGNOSIS — M6289 Other specified disorders of muscle: Secondary | ICD-10-CM

## 2016-10-15 DIAGNOSIS — M62838 Other muscle spasm: Secondary | ICD-10-CM

## 2016-10-15 DIAGNOSIS — I890 Lymphedema, not elsewhere classified: Secondary | ICD-10-CM | POA: Diagnosis not present

## 2016-10-15 NOTE — Therapy (Signed)
[place], [place], [address] Phone: [phone]   Fax:  [phone]  Physical Therapy Treatment  Patient Details  Name: [name] [name] MRN: [id number] Date of Birth: [birth year] Referring Provider: Dr. Kyung Rudd  Encounter Date: 10/15/2016      PT End of Session - 10/15/16 1637    Visit Number 55  50 for lymph   Number of Visits 64  for lymphedema   Date for PT Re-Evaluation 12/08/16   Authorization - Number of Visits 140   PT Start Time 0600   PT Stop Time 1524   PT Time Calculation (min) 50 min   Activity Tolerance Patient tolerated treatment well   Behavior During Therapy [facility] for tasks assessed/performed      Past Medical History:  Diagnosis Date  . Bone metastases ([place]) dx'd 05/2014  . Breast cancer ([place]) dx'd 2005/2011  . Peripheral vascular disease ([place]) 02/2010   blood clot related to porta cath  . PONV (postoperative nausea and vomiting)   . S/P radiation therapy 07/17/2014 through 08/02/2014    Left mediastinum, left seventh rib 3250 cGy in 13 sessions   . S/P radiation therapy 12/11/2014 through 12/22/2014    Left parietal calvarium 2400 cGy in 8 sessions   . Seizures ([place]) 2010   Isolated incident.    Past Surgical History:  Procedure Laterality Date  . AXILLARY LYMPH NODE DISSECTION  Dec. 2011  . BREAST LUMPECTOMY  2005  . IR CHOLANGIOGRAM EXISTING TUBE  10/10/2016  . IR EXCHANGE BILIARY DRAIN  09/18/2016  . IR FLUORO GUIDE CV LINE LEFT  09/05/2016  . IR GENERIC HISTORICAL  05/21/2016   IR RADIOLOGIST EVAL & MGMT 05/21/2016 Sandi Mariscal, MD [facility] INTERV RAD  . IR INT EXT BILIARY DRAIN WITH CHOLANGIOGRAM  09/06/2016  . IR US GUIDE VASC ACCESS LEFT  09/05/2016  . MEDIASTINOTOMY CHAMBERLAIN MCNEIL Left 06/02/2013   Procedure:  MEDIASTINOTOMY CHAMBERLAIN MCNEIL;  Surgeon: [name], MD;  Location: [facility];  Service: Thoracic;  Laterality: Left;  LEFT ANTERIOR MEDIASTINOTOMY   . PORTACATH PLACEMENT  12/11  . removal portacath      There were no vitals filed for this visit.      Subjective Assessment - 10/15/16 1632    Subjective Drain was checked by interventional radiology and is working. Got a new dressing around that area.   Currently in Pain? Yes   Pain Score --  not rated   Pain Location Back   Pain Orientation Lower   Aggravating Factors  having to sit up to sleep   Pain Relieving Factors soft tissue work                         [facility] Adult PT Treatment/Exercise - 10/15/16 0001      Manual Therapy   Manual Lymphatic Drainage (MLD) In supine with head of bed and feet elevated just on a pillow today:  short neck, left axilla and anterior interaxillary anastomosis, right groin and axillo-inguinal anastomosis; right upper outer breast, directing towards pathways; then right UE from fingers to shoulder.  Then in sitting, posterior interaxillary anastomosis right to left.   Other Manual Therapy In sitting, soft tissue work to entire back for pain relief.                  PT Short Term Goals - 02/12/16 1226      PT SHORT TERM GOAL #1  Title pain with walking decreased >/= 25%   Time 4   Period Weeks   Status Achieved           PT Long Term Goals - 08/05/16 1318      PT LONG TERM GOAL #1   Title indpendent with HEP   Time 8   Period Weeks   Status On-going     PT LONG TERM GOAL #2   Title pain with walking decreased >/= 75%   Time 8   Period Weeks   Status On-going     PT LONG TERM GOAL #3   Title ability to flex her right hip with right groin pain decreased >/= 50% due to improved tissue mobility   Time 8   Period Weeks   Status Achieved     PT LONG TERM GOAL #4   Title waking up in middle of night with pain decreased >/=50%   Time 8    Period Weeks   Status Achieved     PT LONG TERM GOAL #5   Title reduction of pain by end of day >/= 50% due to increase tissue mobility   Time 8   Period Weeks   Status On-going           Long Term Clinic Goals - 10/07/16 0938      CC Long Term Goal  #2   Title Pt. will report swelling is adequately managed to enable ADL function at a consistent level.   Baseline This refers to her right breast/axilla/UE lymphedema and not the other swelling that is current. However, this goal is deferred as patient now is unable to maintain function because of progression of cancer.   Status Deferred     CC Long Term Goal  #4   Title Pain/discomfort at right axilla area will be controlled at 6/10 or less.   Status On-going     CC Long Term Goal  #5   Title Patient will avoid infection by ongoing management of her lymphedema at right breast/axilla/upper arm areas.   Baseline She has not had any cellulitis related to lymphedema.   Status On-going            Plan - 10/15/16 1638    Clinical Impression Statement Continues to be made more comfortable with manual therapies.   Rehab Potential Good   Clinical Impairments Affecting Rehab Potential end stage cancer; was referred to hospice but declined those services   PT Frequency 2x / week   PT Duration 12 weeks   PT Treatment/Interventions ADLs/Self Care Home Management;Therapeutic activities;Therapeutic exercise;Patient/family education;Manual techniques;Manual lymph drainage;Scar mobilization   PT Next Visit Plan manual lymph drainage, soft tissue, and myofascial release as needed    Consulted and Agree with Plan of Care Patient      Patient will benefit from skilled therapeutic intervention in order to improve the following deficits and impairments:  Increased edema, Increased fascial restricitons, Pain  Visit Diagnosis: Lymphedema, not elsewhere classified  Muscle stiffness  Other muscle spasm     Problem List Patient Active  Problem List   Diagnosis Date Noted  . Malnutrition of moderate degree ([place]) 09/22/2016  . Rigors 09/18/2016  . Cholestasis, intrahepatic 09/05/2016  . Cholelithiasis   . Jaundice   . RUQ pain 08/26/2016  . Liver metastases ([place]) 02/23/2016  . 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 Zoster 04/04/2015  . Nausea with vomiting 11/18/2014  . Constipation 11/18/2014  . Left-sided thoracic back pain   .  Bone metastases ([place]) 11/16/2014  . Back pain 11/15/2014  . Uncontrolled pain 11/14/2014  . Post-lymphadenectomy lymphedema of arm 05/31/2014  . Malignant neoplasm of upper-outer quadrant of right female breast ([place]) 05/02/2014  . Chest wall pain 03/21/2014  . Abnormal LFTs (liver function tests) 09/12/2013  . Malignant neoplasm of female breast ([place]) 08/18/2013  . 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

## 2016-10-16 ENCOUNTER — Telehealth: Payer: Self-pay | Admitting: *Deleted

## 2016-10-16 NOTE — Telephone Encounter (Signed)
"  [facility] [phone]) calling for orders from Dr. Jana Hakim.  We will go to the home Saturday starting home care.  [facility] will provide care for the biliary drain and measure output but do not have orders.  Can fax orders.  Maintaining I&O  Is also needed."  BC POD 2 fax number provided and notified collaborative of fax being sent for orders.

## 2016-10-17 ENCOUNTER — Encounter: Payer: 59 | Admitting: Physical Therapy

## 2016-10-19 ENCOUNTER — Emergency Department (HOSPITAL_COMMUNITY): Payer: 59

## 2016-10-19 ENCOUNTER — Inpatient Hospital Stay (HOSPITAL_COMMUNITY)
Admission: EM | Admit: 2016-10-19 | Discharge: 2016-10-21 | DRG: 871 | Disposition: A | Discharge status: Home / Self Care | Payer: 59 | Attending: Internal Medicine | Admitting: Internal Medicine

## 2016-10-19 DIAGNOSIS — Z79899 Other long term (current) drug therapy: Secondary | ICD-10-CM

## 2016-10-19 DIAGNOSIS — R52 Pain, unspecified: Secondary | ICD-10-CM

## 2016-10-19 DIAGNOSIS — D638 Anemia in other chronic diseases classified elsewhere: Secondary | ICD-10-CM | POA: Diagnosis present

## 2016-10-19 DIAGNOSIS — Z66 Do not resuscitate: Secondary | ICD-10-CM | POA: Diagnosis present

## 2016-10-19 DIAGNOSIS — R1011 Right upper quadrant pain: Secondary | ICD-10-CM

## 2016-10-19 DIAGNOSIS — R945 Abnormal results of liver function studies: Secondary | ICD-10-CM | POA: Diagnosis present

## 2016-10-19 DIAGNOSIS — Z803 Family history of malignant neoplasm of breast: Secondary | ICD-10-CM | POA: Diagnosis not present

## 2016-10-19 DIAGNOSIS — D649 Anemia, unspecified: Secondary | ICD-10-CM | POA: Diagnosis present

## 2016-10-19 DIAGNOSIS — E860 Dehydration: Secondary | ICD-10-CM | POA: Diagnosis present

## 2016-10-19 DIAGNOSIS — C50919 Malignant neoplasm of unspecified site of unspecified female breast: Secondary | ICD-10-CM

## 2016-10-19 DIAGNOSIS — C771 Secondary and unspecified malignant neoplasm of intrathoracic lymph nodes: Secondary | ICD-10-CM | POA: Diagnosis present

## 2016-10-19 DIAGNOSIS — Z170 Estrogen receptor positive status [ER+]: Secondary | ICD-10-CM | POA: Diagnosis not present

## 2016-10-19 DIAGNOSIS — Z79891 Long term (current) use of opiate analgesic: Secondary | ICD-10-CM | POA: Diagnosis not present

## 2016-10-19 DIAGNOSIS — Z923 Personal history of irradiation: Secondary | ICD-10-CM

## 2016-10-19 DIAGNOSIS — Z888 Allergy status to other drugs, medicaments and biological substances status: Secondary | ICD-10-CM

## 2016-10-19 DIAGNOSIS — T85520A Displacement of bile duct prosthesis, initial encounter: Secondary | ICD-10-CM

## 2016-10-19 DIAGNOSIS — E871 Hypo-osmolality and hyponatremia: Secondary | ICD-10-CM | POA: Diagnosis present

## 2016-10-19 DIAGNOSIS — Z885 Allergy status to narcotic agent status: Secondary | ICD-10-CM | POA: Diagnosis not present

## 2016-10-19 DIAGNOSIS — C50411 Malignant neoplasm of upper-outer quadrant of right female breast: Secondary | ICD-10-CM | POA: Diagnosis present

## 2016-10-19 DIAGNOSIS — N179 Acute kidney failure, unspecified: Secondary | ICD-10-CM | POA: Diagnosis present

## 2016-10-19 DIAGNOSIS — C50211 Malignant neoplasm of upper-inner quadrant of right female breast: Secondary | ICD-10-CM

## 2016-10-19 DIAGNOSIS — L89322 Pressure ulcer of left buttock, stage 2: Secondary | ICD-10-CM | POA: Diagnosis present

## 2016-10-19 DIAGNOSIS — A419 Sepsis, unspecified organism: Principal | ICD-10-CM | POA: Diagnosis present

## 2016-10-19 DIAGNOSIS — C7951 Secondary malignant neoplasm of bone: Secondary | ICD-10-CM | POA: Diagnosis present

## 2016-10-19 DIAGNOSIS — R112 Nausea with vomiting, unspecified: Secondary | ICD-10-CM

## 2016-10-19 DIAGNOSIS — K5909 Other constipation: Secondary | ICD-10-CM | POA: Diagnosis present

## 2016-10-19 DIAGNOSIS — K831 Obstruction of bile duct: Secondary | ICD-10-CM

## 2016-10-19 DIAGNOSIS — K767 Hepatorenal syndrome: Secondary | ICD-10-CM | POA: Diagnosis present

## 2016-10-19 DIAGNOSIS — F419 Anxiety disorder, unspecified: Secondary | ICD-10-CM | POA: Diagnosis present

## 2016-10-19 DIAGNOSIS — R0789 Other chest pain: Secondary | ICD-10-CM

## 2016-10-19 DIAGNOSIS — R7989 Other specified abnormal findings of blood chemistry: Secondary | ICD-10-CM | POA: Diagnosis present

## 2016-10-19 DIAGNOSIS — C7800 Secondary malignant neoplasm of unspecified lung: Secondary | ICD-10-CM | POA: Diagnosis present

## 2016-10-19 DIAGNOSIS — R17 Unspecified jaundice: Secondary | ICD-10-CM

## 2016-10-19 DIAGNOSIS — C787 Secondary malignant neoplasm of liver and intrahepatic bile duct: Secondary | ICD-10-CM | POA: Diagnosis present

## 2016-10-19 DIAGNOSIS — E8770 Fluid overload, unspecified: Secondary | ICD-10-CM | POA: Diagnosis present

## 2016-10-19 DIAGNOSIS — I739 Peripheral vascular disease, unspecified: Secondary | ICD-10-CM | POA: Diagnosis present

## 2016-10-19 DIAGNOSIS — L8990 Pressure ulcer of unspecified site, unspecified stage: Secondary | ICD-10-CM | POA: Insufficient documentation

## 2016-10-19 DIAGNOSIS — K830 Cholangitis: Secondary | ICD-10-CM | POA: Diagnosis present

## 2016-10-19 LAB — COMPREHENSIVE METABOLIC PANEL
ALBUMIN: 1.8 g/dL — AB (ref 3.5–5.0)
ALT: 69 U/L — ABNORMAL HIGH (ref 14–54)
ANION GAP: 13 (ref 5–15)
AST: 212 U/L — ABNORMAL HIGH (ref 15–41)
Alkaline Phosphatase: 835 U/L — ABNORMAL HIGH (ref 38–126)
BUN: 53 mg/dL — ABNORMAL HIGH (ref 6–20)
CALCIUM: 8.2 mg/dL — AB (ref 8.9–10.3)
CO2: 19 mmol/L — AB (ref 22–32)
Chloride: 89 mmol/L — ABNORMAL LOW (ref 101–111)
Creatinine, Ser: 3.79 mg/dL — ABNORMAL HIGH (ref 0.44–1.00)
GFR calc non Af Amer: 12 mL/min — ABNORMAL LOW (ref >60)
GFR, EST AFRICAN AMERICAN: 14 mL/min — AB (ref >60)
GLUCOSE: 102 mg/dL — AB (ref 65–99)
POTASSIUM: 5 mmol/L (ref 3.5–5.1)
SODIUM: 121 mmol/L — AB (ref 135–145)
Total Bilirubin: 21.3 mg/dL (ref 0.3–1.2)
Total Protein: 5 g/dL — ABNORMAL LOW (ref 6.5–8.1)

## 2016-10-19 LAB — CBC WITH DIFFERENTIAL/PLATELET
BASOS ABS: 0 10*3/uL (ref 0.0–0.1)
Basophils Relative: 0 %
EOS ABS: 0 10*3/uL (ref 0.0–0.7)
Eosinophils Relative: 0 %
HCT: 22.7 % — ABNORMAL LOW (ref 36.0–46.0)
HEMOGLOBIN: 8.2 g/dL — AB (ref 12.0–15.0)
LYMPHS ABS: 0.5 10*3/uL — AB (ref 0.7–4.0)
Lymphocytes Relative: 2 %
MCH: 32.9 pg (ref 26.0–34.0)
MCHC: 36.1 g/dL — AB (ref 30.0–36.0)
MCV: 91.2 fL (ref 78.0–100.0)
MONO ABS: 0.5 10*3/uL (ref 0.1–1.0)
MONOS PCT: 2 %
NEUTROS ABS: 22.3 10*3/uL — AB (ref 1.7–7.7)
Neutrophils Relative %: 96 %
PLATELETS: 458 10*3/uL — AB (ref 150–400)
RBC: 2.49 MIL/uL — ABNORMAL LOW (ref 3.87–5.11)
RDW: 15.9 % — AB (ref 11.5–15.5)
WBC Morphology: INCREASED
WBC: 23.3 10*3/uL — AB (ref 4.0–10.5)

## 2016-10-19 LAB — LIPASE, BLOOD: Lipase: 56 U/L — ABNORMAL HIGH (ref 11–51)

## 2016-10-19 MED ORDER — LORAZEPAM 2 MG/ML IJ SOLN
1.0000 mg | Freq: Once | INTRAMUSCULAR | Status: AC
  Administered 2016-10-19: 1 mg via INTRAVENOUS
  Filled 2016-10-19: qty 1

## 2016-10-19 MED ORDER — FENTANYL CITRATE (PF) 100 MCG/2ML IJ SOLN
50.0000 ug | Freq: Once | INTRAMUSCULAR | Status: AC
  Administered 2016-10-19: 50 ug via INTRAVENOUS
  Filled 2016-10-19: qty 2

## 2016-10-19 MED ORDER — SODIUM CHLORIDE 0.9 % IV BOLUS (SEPSIS)
1000.0000 mL | Freq: Once | INTRAVENOUS | Status: AC
  Administered 2016-10-19: 1000 mL via INTRAVENOUS

## 2016-10-19 MED ORDER — PROMETHAZINE HCL 25 MG/ML IJ SOLN
12.5000 mg | Freq: Once | INTRAMUSCULAR | Status: AC
  Administered 2016-10-19: 12.5 mg via INTRAVENOUS
  Filled 2016-10-19: qty 1

## 2016-10-19 MED ORDER — PIPERACILLIN-TAZOBACTAM 3.375 G IVPB 30 MIN: 3.3750 g | Freq: Once | INTRAVENOUS | Status: DC

## 2016-10-19 MED ORDER — VANCOMYCIN HCL IN DEXTROSE 1-5 GM/200ML-% IV SOLN: 1000.0000 mg | Freq: Once | INTRAVENOUS | Status: DC

## 2016-10-19 MED ORDER — PIPERACILLIN-TAZOBACTAM IN DEX 2-0.25 GM/50ML IV SOLN
2.2500 g | Freq: Four times a day (QID) | INTRAVENOUS | Status: DC
  Administered 2016-10-19 – 2016-10-21 (×7): 2.25 g via INTRAVENOUS
  Filled 2016-10-19 (×7): qty 50

## 2016-10-19 NOTE — ED Triage Notes (Addendum)
Cancer pt, c/o of pain, n/v

## 2016-10-19 NOTE — ED Notes (Signed)
Pt family  refused 2nd set of cultures. Arm restriction and does not want to wake her

## 2016-10-19 NOTE — ED Provider Notes (Signed)
[facility] DEPT Provider Note   CSN: [id number] Arrival date & time: 10/19/16  1841     History   Chief Complaint No chief complaint on file.   HPI [name] [name] is a 56 y.o. female.  Level 5 caveat for urgent need for intervention.  Patient presents with decreased oral intake, increased pain secondary to her metastatic cancer, anxiety, decreased urinary output. 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 She has been taking Toradol 15 mg 4 times a day for pain. She has become more jaundiced recently. I received a call from the oncologist Dr. Jana Hakim in anticipation of her visit.      Past Medical History:  Diagnosis Date  . Bone metastases ([place]) dx'd 05/2014  . Breast cancer ([place]) dx'd 2005/2011  . Peripheral vascular disease ([place]) 02/2010   blood clot related to porta cath  . PONV (postoperative nausea and vomiting)   . S/P radiation therapy 07/17/2014 through 08/02/2014    Left mediastinum, left seventh rib 3250 cGy in 13 sessions   . S/P radiation therapy 12/11/2014 through 12/22/2014    Left parietal calvarium 2400 cGy in 8 sessions   . Seizures ([place]) 2010   Isolated incident.    Patient Active Problem List   Diagnosis Date Noted  . Malnutrition of moderate degree ([place]) 09/22/2016  . Rigors 09/18/2016  . Cholestasis, intrahepatic 09/05/2016  . Cholelithiasis   . Jaundice   . RUQ pain 08/26/2016  . Liver metastases ([place]) 02/23/2016  . Malignant pleural effusion, left 04/09/2015  . Zoster 04/04/2015  . Nausea with vomiting 11/18/2014  . Constipation 11/18/2014  . Left-sided thoracic back pain   . Bone metastases ([place]) 11/16/2014  . Back pain 11/15/2014  . Uncontrolled pain 11/14/2014  . Post-lymphadenectomy lymphedema of arm 05/31/2014  . Malignant neoplasm of upper-outer quadrant of right  female breast ([place]) 05/02/2014  . Chest wall pain 03/21/2014  . Abnormal LFTs (liver function tests) 09/12/2013  . Malignant neoplasm of female breast ([place]) 08/18/2013  . 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 AXILLARY LYMPH NODE DISSECTION  Dec. 2011  . BREAST LUMPECTOMY  2005  . IR CHOLANGIOGRAM EXISTING TUBE  10/10/2016  . IR EXCHANGE BILIARY DRAIN  09/18/2016  . IR FLUORO GUIDE CV LINE LEFT  09/05/2016  . IR GENERIC HISTORICAL  05/21/2016   IR RADIOLOGIST EVAL & MGMT 05/21/2016 Sandi Mariscal, MD [facility] INTERV RAD  . IR INT EXT BILIARY DRAIN WITH CHOLANGIOGRAM  09/06/2016  . IR US GUIDE VASC ACCESS LEFT  09/05/2016  . MEDIASTINOTOMY CHAMBERLAIN MCNEIL Left 06/02/2013   Procedure: MEDIASTINOTOMY CHAMBERLAIN MCNEIL;  Surgeon: [name], MD;  Location: [facility];  Service: Thoracic;  Laterality: Left;  LEFT ANTERIOR MEDIASTINOTOMY   . PORTACATH PLACEMENT  12/11  . removal portacath      OB History    No data available       Home Medications    Prior to Admission medications   Medication Sig Start Date End Date Taking? Authorizing Provider  calcium-vitamin D (OSCAL WITH D) 500-200 MG-UNIT tablet Take 2 tablets by mouth 2 (two) times daily. 09/11/16  Yes Magrinat, Virgie Dad, MD  cyclobenzaprine (FLEXERIL) 10 MG tablet Take 1 tablet by mouth daily as needed for muscle spasms. 10/10/16  Yes [provider]  docusate sodium (COLACE) 100 MG capsule Take 1 capsule (100 mg total) by mouth 2 (two) times daily. 09/11/16  Yes Magrinat, Virgie Dad, MD  famotidine (PEPCID) 40 MG tablet Take 1 tablet (40 mg total) by mouth 2 (two) times daily. 09/21/16  Yes Ennever, Rudell Cobb, MD  ketorolac (TORADOL) 10 MG tablet Take 1 tablet (10 mg total) by mouth every 6 (six) hours as needed. Patient taking differently: Take 15 mg by mouth every 6 (six) hours.  09/11/16  Yes Magrinat, Virgie Dad, MD  LORazepam (ATIVAN)  0.5 MG tablet Take 1 tablet (0.5 mg total) by mouth every 8 (eight) hours as needed for anxiety. 08/30/16 08/30/17 Yes Gherghe, Vella Redhead, MD  Melatonin 3 MG TABS Take 3 mg by mouth at bedtime.   Yes [provider]  promethazine (PHENERGAN) 12.5 MG tablet Take 1 tablet (12.5 mg total) by mouth every 6 (six) hours as needed for nausea. 09/11/16  Yes Magrinat, Virgie Dad, MD  traMADol (ULTRAM) 50 MG tablet Take 1-2 tablets (50-100 mg total) by mouth every 6 (six) hours as needed. Patient taking differently: Take 50-100 mg by mouth every 6 (six) hours.  09/11/16  Yes Magrinat, Virgie Dad, MD  alum & mag hydroxide-simeth (MAALOX/MYLANTA) 200-200-20 MG/5ML suspension Take 30 mLs by mouth every 6 (six) hours as needed for indigestion, heartburn or flatulence (Bloating,gas pain). Patient not taking: Reported on 10/19/2016 09/11/16   Magrinat, Virgie Dad, MD  B Complex-C (B-COMPLEX WITH VITAMIN C) tablet Take 1 tablet by mouth daily. Reported on 03/27/2015 01/19/15   Magrinat, Virgie Dad, MD  betamethasone valerate ointment (VALISONE) 0.1 % Apply 1 application topically 2 (two) times daily. Patient not taking: Reported on 10/19/2016 09/15/16   Magrinat, Virgie Dad, MD  cholecalciferol 2000 UNITS tablet Take 1 tablet (2,000 Units total) by mouth daily. 01/19/15   Magrinat, Virgie Dad, MD  famotidine (PEPCID) 40 MG tablet Take 0.5-1 tablets (20-40 mg total) by mouth 2 (two) times daily. 40 mg in the morning and 20 mg in the evening Patient not taking: Reported on 10/19/2016 08/30/16   Caren Griffins, MD  fentaNYL (ACTIQ) 200 MCG lollipop Place 200 mcg inside cheek every 4 (four) hours as needed for pain. Patient not taking: Reported on 10/19/2016 09/23/16   Magrinat, Virgie Dad, MD  folic acid (FOLVITE) 1 MG tablet Take 1 tablet (1 mg total) by mouth daily. Patient not taking: Reported on 10/19/2016 09/21/16   Volanda Napoleon, MD  ketoconazole (NIZORAL) 2 % cream Apply 1 application topically daily. Patient not taking:  Reported on 10/19/2016 10/08/16   Magrinat, Virgie Dad, MD  Morphine Sulfate (MORPHINE CONCENTRATE) 10 mg / 0.5 ml concentrated solution Take 10 mg by mouth every 2 (two) hours as needed for severe pain.  09/11/16   [provider]  polyethylene glycol (MIRALAX / GLYCOLAX) packet Take 17 g by mouth daily. Patient not taking: Reported on 10/19/2016 09/11/16   Magrinat, Virgie Dad, MD  triamterene-hydrochlorothiazide (DYAZIDE) 37.5-25 MG capsule Take 1 each (1 capsule total) by mouth daily as needed (weight gain). Patient not taking: Reported on 10/19/2016 09/15/16   Magrinat, Virgie Dad, MD    Family History Family History  Problem Relation Age of Onset  . COPD Mother   . Breast cancer Sister 97    Social History Social History  Substance Use Topics  . Smoking status: Never Smoker  . Smokeless tobacco: Never Used  . Alcohol use No     Allergies   2nd skin quick heal; Decadron [dexamethasone]; Dilaudid [hydromorphone]; Enoxaparin; Fluconazole; Hydromorphone hcl; Morphine and related; Ondansetron; Protonix [pantoprazole sodium]; and Tegaderm ag  mesh [silver]   Review of Systems Review of Systems  Reason unable to perform ROS: Urgent need for intervention.     Physical Exam Updated Vital Signs BP 100/61 (BP Location: Left Leg)   Pulse (!) 104   Temp (!) 97.5 F (36.4 C) (Oral)   Resp 14   SpO2 96%   Physical Exam  Constitutional: She is oriented to person, place, and time.  Dehydrated, jaundice  HENT:  Head: Normocephalic and atraumatic.  Eyes: Conjunctivae are normal.  Neck: Neck supple.  Cardiovascular: Normal rate and regular rhythm.   Pulmonary/Chest: Effort normal and breath sounds normal.  Abdominal:  Catheter inserted into upper abdomen  Musculoskeletal: Normal range of motion.  Neurological: She is alert and oriented to person, place, and time.  Skin: Skin is warm and dry.  Psychiatric: She has a normal mood and affect. Her behavior is normal.  Nursing note  and vitals reviewed.    ED Treatments / Results  Labs (all labs ordered are listed, but only abnormal results are displayed) Labs Reviewed  CBC WITH DIFFERENTIAL/PLATELET - Abnormal; Notable for the following:       Result Value   WBC 23.3 (*)    RBC 2.49 (*)    Hemoglobin 8.2 (*)    HCT 22.7 (*)    MCHC 36.1 (*)    RDW 15.9 (*)    Platelets 458 (*)    Neutro Abs 22.3 (*)    Lymphs Abs 0.5 (*)    All other components within normal limits  COMPREHENSIVE METABOLIC PANEL - Abnormal; Notable for the following:    Sodium 121 (*)    Chloride 89 (*)    CO2 19 (*)    Glucose, Bld 102 (*)    BUN 53 (*)    Creatinine, Ser 3.79 (*)    Calcium 8.2 (*)    Total Protein 5.0 (*)    Albumin 1.8 (*)    AST 212 (*)    ALT 69 (*)    Alkaline Phosphatase 835 (*)    Total Bilirubin 21.3 (*)    GFR calc non Af Amer 12 (*)    GFR calc Af Amer 14 (*)    All other components within normal limits  LIPASE, BLOOD - Abnormal; Notable for the following:    Lipase 56 (*)    All other components within normal limits  CULTURE, BLOOD (ROUTINE X 2)  CULTURE, BLOOD (ROUTINE X 2)  URINALYSIS, ROUTINE W REFLEX MICROSCOPIC    EKG  EKG Interpretation None       Radiology Dg Chest Port 1 View  Result Date: 10/19/2016 CLINICAL DATA:  Metastatic breast cancer EXAM: PORTABLE CHEST 1 VIEW COMPARISON:  06/01/2015, PET-CT 05/14/2016 FINDINGS: Left upper extremity catheter tip overlies the mid to low right atrium. Left pleural disease may be slightly increased at the left lung apex. Parenchymal opacity in the left upper lobe and left lung base are similar compared to prior. Right lung is clear. Surgical clips in the right axilla. Stable heart size. No pneumothorax. IMPRESSION: 1. Left upper extremity catheter tip overlies the mid right atrium 2. Volume loss on the left with pleural and parenchymal opacity as before. Probable slight increase in left apical pleural opacity compared to prior radiograph.  Electronically Signed   By: Donavan Foil M.D.   On: 10/19/2016 21:08    Procedures Procedures (including critical care time)  Medications Ordered in ED Medications  piperacillin-tazobactam (ZOSYN) IVPB 2.25 g (not administered)  vancomycin (VANCOCIN)  IVPB 1000 mg/200 mL premix (not administered)  sodium chloride 0.9 % bolus 1,000 mL (0 mLs Intravenous Stopped 10/19/16 2120)  fentaNYL (SUBLIMAZE) injection 50 mcg (50 mcg Intravenous Given 10/19/16 2000)  promethazine (PHENERGAN) injection 12.5 mg (12.5 mg Intravenous Given 10/19/16 2004)  LORazepam (ATIVAN) injection 1 mg (1 mg Intravenous Given 10/19/16 2014)  fentaNYL (SUBLIMAZE) injection 50 mcg (50 mcg Intravenous Given 10/19/16 2051)  sodium chloride 0.9 % bolus 1,000 mL (0 mLs Intravenous Stopped 10/19/16 2232)     Initial Impression / Assessment and Plan / ED Course  I have reviewed the triage vital signs and the nursing notes.  Pertinent labs & imaging results that were available during my care of the patient were reviewed by me and considered in my medical decision making (see chart for details).     Patient is terminally ill secondary to metastatic breast cancer. She has a leukocytosis. Will start IV hydration, pain management, IV antibiotics, admit to general medicine.  Final Clinical Impressions(s) / ED Diagnoses   Final diagnoses:  Metastatic breast cancer ([place])  Hyperbilirubinemia    New Prescriptions New Prescriptions   No medications on file     Nat Christen, MD 10/19/16 2256

## 2016-10-20 ENCOUNTER — Encounter (HOSPITAL_COMMUNITY): Payer: Self-pay | Admitting: Internal Medicine

## 2016-10-20 DIAGNOSIS — C50919 Malignant neoplasm of unspecified site of unspecified female breast: Secondary | ICD-10-CM

## 2016-10-20 DIAGNOSIS — R945 Abnormal results of liver function studies: Secondary | ICD-10-CM

## 2016-10-20 DIAGNOSIS — N179 Acute kidney failure, unspecified: Secondary | ICD-10-CM | POA: Diagnosis present

## 2016-10-20 DIAGNOSIS — E871 Hypo-osmolality and hyponatremia: Secondary | ICD-10-CM

## 2016-10-20 DIAGNOSIS — D649 Anemia, unspecified: Secondary | ICD-10-CM | POA: Diagnosis present

## 2016-10-20 DIAGNOSIS — L8990 Pressure ulcer of unspecified site, unspecified stage: Secondary | ICD-10-CM | POA: Insufficient documentation

## 2016-10-20 LAB — PROTIME-INR
INR: 1.79
PROTHROMBIN TIME: 21 s — AB (ref 11.4–15.2)

## 2016-10-20 LAB — HEPATIC FUNCTION PANEL
ALK PHOS: 703 U/L — AB (ref 38–126)
ALT: 80 U/L — AB (ref 14–54)
AST: 326 U/L — ABNORMAL HIGH (ref 15–41)
Albumin: 2.1 g/dL — ABNORMAL LOW (ref 3.5–5.0)
BILIRUBIN DIRECT: 10.8 mg/dL — AB (ref 0.1–0.5)
BILIRUBIN INDIRECT: 8.3 mg/dL — AB (ref 0.3–0.9)
BILIRUBIN TOTAL: 19.1 mg/dL — AB (ref 0.3–1.2)
TOTAL PROTEIN: 4.8 g/dL — AB (ref 6.5–8.1)

## 2016-10-20 LAB — CBC
HCT: 19.2 % — ABNORMAL LOW (ref 36.0–46.0)
Hemoglobin: 6.9 g/dL — CL (ref 12.0–15.0)
MCH: 33 pg (ref 26.0–34.0)
MCHC: 35.9 g/dL (ref 30.0–36.0)
MCV: 91.9 fL (ref 78.0–100.0)
PLATELETS: 379 10*3/uL (ref 150–400)
RBC: 2.09 MIL/uL — AB (ref 3.87–5.11)
RDW: 16.1 % — ABNORMAL HIGH (ref 11.5–15.5)
WBC: 18.5 10*3/uL — ABNORMAL HIGH (ref 4.0–10.5)

## 2016-10-20 LAB — URINALYSIS, ROUTINE W REFLEX MICROSCOPIC
GLUCOSE, UA: NEGATIVE mg/dL
KETONES UR: NEGATIVE mg/dL
LEUKOCYTES UA: NEGATIVE
NITRITE: NEGATIVE
PH: 5 (ref 5.0–8.0)
Protein, ur: NEGATIVE mg/dL
SPECIFIC GRAVITY, URINE: 1.015 (ref 1.005–1.030)

## 2016-10-20 LAB — SODIUM, URINE, RANDOM

## 2016-10-20 LAB — BASIC METABOLIC PANEL
Anion gap: 13 (ref 5–15)
BUN: 49 mg/dL — ABNORMAL HIGH (ref 6–20)
CALCIUM: 7.6 mg/dL — AB (ref 8.9–10.3)
CHLORIDE: 92 mmol/L — AB (ref 101–111)
CO2: 17 mmol/L — ABNORMAL LOW (ref 22–32)
CREATININE: 3.34 mg/dL — AB (ref 0.44–1.00)
GFR calc non Af Amer: 14 mL/min — ABNORMAL LOW (ref >60)
GFR, EST AFRICAN AMERICAN: 17 mL/min — AB (ref >60)
Glucose, Bld: 114 mg/dL — ABNORMAL HIGH (ref 65–99)
Potassium: 4.6 mmol/L (ref 3.5–5.1)
SODIUM: 122 mmol/L — AB (ref 135–145)

## 2016-10-20 LAB — OSMOLALITY, URINE: Osmolality, Ur: 368 mOsm/kg (ref 300–900)

## 2016-10-20 LAB — PREPARE RBC (CROSSMATCH)

## 2016-10-20 LAB — CREATININE, URINE, RANDOM: CREATININE, URINE: 139.55 mg/dL

## 2016-10-20 MED ORDER — DIPHENHYDRAMINE-ZINC ACETATE 2-0.1 % EX CREA
TOPICAL_CREAM | Freq: Three times a day (TID) | CUTANEOUS | Status: DC | PRN
  Administered 2016-10-20: 16:00:00 via TOPICAL
  Filled 2016-10-20: qty 28

## 2016-10-20 MED ORDER — MORPHINE SULFATE (CONCENTRATE) 10 MG/0.5ML PO SOLN
10.0000 mg | ORAL | Status: DC | PRN
Start: 2016-10-20 — End: 2016-10-20

## 2016-10-20 MED ORDER — LORAZEPAM 0.5 MG PO TABS
0.5000 mg | ORAL_TABLET | Freq: Three times a day (TID) | ORAL | Status: DC | PRN
  Administered 2016-10-20: 0.5 mg via ORAL
  Filled 2016-10-20: qty 1

## 2016-10-20 MED ORDER — FENTANYL CITRATE (PF) 100 MCG/2ML IJ SOLN
25.0000 ug | INTRAMUSCULAR | Status: DC | PRN
  Filled 2016-10-20: qty 2

## 2016-10-20 MED ORDER — SODIUM CHLORIDE 0.9 % IV SOLN
Freq: Once | INTRAVENOUS | Status: AC
  Administered 2016-10-20: 12:00:00 via INTRAVENOUS

## 2016-10-20 MED ORDER — SODIUM CHLORIDE 0.9% FLUSH: 10.0000 mL | INTRAVENOUS | Status: DC | PRN

## 2016-10-20 MED ORDER — FAMOTIDINE 20 MG PO TABS
40.0000 mg | ORAL_TABLET | Freq: Two times a day (BID) | ORAL | Status: DC
  Administered 2016-10-20 – 2016-10-21 (×3): 40 mg via ORAL
  Filled 2016-10-20: qty 2

## 2016-10-20 MED ORDER — DOCUSATE SODIUM 100 MG PO CAPS
100.0000 mg | ORAL_CAPSULE | Freq: Two times a day (BID) | ORAL | Status: DC
  Administered 2016-10-20 (×2): 100 mg via ORAL
  Filled 2016-10-20 (×2): qty 1

## 2016-10-20 MED ORDER — ACETAMINOPHEN 650 MG RE SUPP: 650.0000 mg | Freq: Four times a day (QID) | RECTAL | Status: DC | PRN

## 2016-10-20 MED ORDER — ALBUMIN HUMAN 25 % IV SOLN
12.5000 g | Freq: Four times a day (QID) | INTRAVENOUS | Status: AC
  Administered 2016-10-20 (×2): 12.5 g via INTRAVENOUS
  Filled 2016-10-20 (×2): qty 50

## 2016-10-20 MED ORDER — ACETAMINOPHEN 325 MG PO TABS: 650.0000 mg | ORAL_TABLET | Freq: Four times a day (QID) | ORAL | Status: DC | PRN

## 2016-10-20 MED ORDER — CYCLOBENZAPRINE HCL 10 MG PO TABS: 10.0000 mg | ORAL_TABLET | Freq: Every day | ORAL | Status: DC | PRN

## 2016-10-20 MED ORDER — ORAL CARE MOUTH RINSE
15.0000 mL | Freq: Two times a day (BID) | OROMUCOSAL | Status: DC
  Administered 2016-10-20 – 2016-10-21 (×3): 15 mL via OROMUCOSAL

## 2016-10-20 MED ORDER — FAMOTIDINE 20 MG PO TABS
40.0000 mg | ORAL_TABLET | Freq: Two times a day (BID) | ORAL | Status: DC
  Filled 2016-10-20: qty 2

## 2016-10-20 MED ORDER — CALCIUM CARBONATE ANTACID 500 MG PO CHEW
1.0000 | CHEWABLE_TABLET | Freq: Two times a day (BID) | ORAL | Status: DC | PRN
  Administered 2016-10-20: 200 mg via ORAL
  Filled 2016-10-20: qty 1

## 2016-10-20 MED ORDER — DOCUSATE SODIUM 100 MG PO CAPS: 100.0000 mg | ORAL_CAPSULE | Freq: Two times a day (BID) | ORAL | Status: DC

## 2016-10-20 MED ORDER — VANCOMYCIN HCL IN DEXTROSE 1-5 GM/200ML-% IV SOLN
1000.0000 mg | INTRAVENOUS | Status: DC
  Administered 2016-10-20: 1000 mg via INTRAVENOUS
  Filled 2016-10-20: qty 200

## 2016-10-20 NOTE — Progress Notes (Signed)
Wound nurse in to assess stage 1 to right buttock. Wound nurse educated pt to discontinue use of "donut". Pt verbalizes understanding.

## 2016-10-20 NOTE — Progress Notes (Signed)
CRITICAL VALUE ALERT  Critical Value:  hgb 6.9  Date & Time Notied:  10/20/16 0840  Provider Notified: 10/20/16 0842  Orders Received/Actions taken: 10/20/16 7591

## 2016-10-20 NOTE — Progress Notes (Addendum)
PROGRESS NOTE    [name] [name]  EPP:[id number] DOB: 11-12-1960 DOA: 10/19/2016 PCP: Chauncey Cruel, MD     Brief Narrative:  [name] [name] is a 56 y.o. female with history of metastatic breast cancer, obstructive jaundice status post biliary drain placement, anemia was brought to the ER the patient was found to have decreasing urine output and poor oral intake and increasing weakness. In the emergency department, work up revealed worsening acute kidney injury.   Assessment & Plan:   Principal Problem:   Acute renal failure (ARF) (HCC) Active Problems:   Abnormal LFTs (liver function tests)   Normochromic normocytic anemia   Hyponatremia   ARF (acute renal failure) (HCC)   Pressure injury of skin   AKI  -In setting of poor oral intake, toradol use, likely developing hepatorenal syndrome  -Cr marginally improved from 3.79 at admission to 3.34. Hold additional fluids, trend BMP   Sepsis secondary to cholangitis  -Blood cultures pending  -Biliary drain in place. IR consult to review and manage drain, patient with pericatheter leakage, discomfort, and feels the drain has been displaced  -Continue zosyn   Stage IV breast cancer with mets to liver and obstructive jaundice s/p perc biliary drain placement 09/06/16 with catheter exchange and upsizing 09/18/16  -Follows with Dr. Jana Hakim as outpatient, IR following as well  -Bilirubin remains elevated, but stable at 19.1. Trend LFT -Reviewed Dr. Virgie Dad recent office notes. Seems to be a complicated/difficult situation and now progressive renal insufficiency. Unclear that there is much he can offer. Patient had previously been enrolled with home hospice, but decided to pursue medical care and has since un-enrolled with hospice. Dr. Virgie Dad note states that if situation gets worse, hospice may be pursued. I discussed this with patient and husband at bedside. Patient states "no" and states that her kidney function and sodium  levels will improve and she is "not at all" ready for hospice care.  -Palliative care consulted for goals of care -Discussed with Dr. Lindi Adie for oncology consult  Hypervolemic hyponatremia -Has been on fluid restriction per Dr. Jana Hakim -Will hold further IVF  -Na stable currently at 122   Anemia of chronic disease -Will transfuse 1u pRBC today. Trend CBC   Left gluteal skin abrasion -Without acute infection -Patient requesting wound consult   Chronic constipation -Colace BID    DVT prophylaxis: SCD Code Status: DNR Family Communication: Husband at bedside Disposition Plan: to be determined   Consultants:   Oncology  IR  Palliative care  Procedures:   None  Antimicrobials:  Anti-infectives    Start     Dose/Rate Route Frequency Ordered Stop   10/20/16 0200  vancomycin (VANCOCIN) IVPB 1000 mg/200 mL premix  Status:  Discontinued     1,000 mg 200 mL/hr over 60 Minutes Intravenous Every 48 hours 10/20/16 0147 10/20/16 1103   10/19/16 2300  piperacillin-tazobactam (ZOSYN) IVPB 2.25 g     2.25 g 100 mL/hr over 30 Minutes Intravenous Every 6 hours 10/19/16 2216     10/19/16 2230  piperacillin-tazobactam (ZOSYN) IVPB 3.375 g  Status:  Discontinued     3.375 g 100 mL/hr over 30 Minutes Intravenous  Once 10/19/16 2216 10/19/16 2216   10/19/16 2230  vancomycin (VANCOCIN) IVPB 1000 mg/200 mL premix  Status:  Discontinued     1,000 mg 200 mL/hr over 60 Minutes Intravenous  Once 10/19/16 2229 10/20/16 0147       Subjective: Multiple complaints and concerns today. She is concerned about her biliary  drain, pericatheter leakage, pain in her abdomen, constipation, rash between her fingers, left gluteal skin abrasion. I addressed these items of concern in room with husband. We also discussed her overall worsening kidney state, her disease process. We discussed pain control and that there are not many options we have as she is refusing narcotics due to constipation, and due to  her liver function and kidney function, we have limited medications available to [place]. I reviewed her labs with her and husband in the room. Then we briefly spoke about hospice care and what that would mean.She is very adamant that she is not a hospice candidate and is "clearly not ready for that."   Objective: Vitals:   10/19/16 2358 10/20/16 0055 10/20/16 0429 10/20/16 0500  BP:  105/62 125/63   Pulse:  99 96   Resp:  20 16   Temp:  97.9 F (36.6 C) 97.7 F (36.5 C)   TempSrc:  Oral Oral   SpO2:  98% 100%   Weight: 79.1 kg (174 lb 5 oz) 80.9 kg (178 lb 5.6 oz)  82.2 kg (181 lb 3.5 oz)  Height: 5\' 4"  (1.626 m) 5\' 4"  (1.626 m)      Intake/Output Summary (Last 24 hours) at 10/20/16 1122 Last data filed at 10/20/16 0900  Gross per 24 hour  Intake              350 ml  Output              400 ml  Net              -50 ml   Filed Weights   10/19/16 2358 10/20/16 0055 10/20/16 0500  Weight: 79.1 kg (174 lb 5 oz) 80.9 kg (178 lb 5.6 oz) 82.2 kg (181 lb 3.5 oz)    Examination:  General exam: Appears calm, with generalized discomfort, jaundiced  Respiratory system: Clear to auscultation. Respiratory effort normal. Cardiovascular system: S1 & S2 heard. No JVD, murmurs, rubs, gallops or clicks. Gastrointestinal system: Abdomen is distended, +biliary drain in place with soiled dressing, soft and TTP anteriorly.  Central nervous system: Alert and oriented. No focal neurological deficits. Extremities: Symmetric  Skin: +left gluteal skin abrasion without gross drainage or infection  Psychiatry: Judgement and insight appear normal. Mood & affect appropriate.   Data Reviewed: I have personally reviewed following labs and imaging studies  CBC:  Recent Labs Lab 10/14/16 1230 10/19/16 1900 10/20/16 0753  WBC 9.4 23.3* 18.5*  NEUTROABS 8.1* 22.3*  --   HGB 7.4* 8.2* 6.9*  HCT 20.9* 22.7* 19.2*  MCV 94.1 91.2 91.9  PLT 423* 458* 818   Basic Metabolic Panel:  Recent Labs Lab  10/14/16 1231 10/19/16 1900 10/20/16 0753  NA 122* 121* 122*  K 4.4 5.0 4.6  CL  --  89* 92*  CO2 20* 19* 17*  GLUCOSE 106 102* 114*  BUN 35.5* 53* 49*  CREATININE 2.5* 3.79* 3.34*  CALCIUM 8.3* 8.2* 7.6*   GFR: Estimated Creatinine Clearance: 19.5 mL/min (A) (by C-G formula based on SCr of 3.34 mg/dL (H)). Liver Function Tests:  Recent Labs Lab 10/14/16 1231 10/19/16 1900 10/20/16 0753  AST 182* 212* 326*  ALT 70* 69* 80*  ALKPHOS 727* 835* 703*  BILITOT 19.34* 21.3* 19.1*  PROT 4.5* 5.0* 4.8*  ALBUMIN 1.7* 1.8* 2.1*    Recent Labs Lab 10/19/16 1900  LIPASE 56*   No results for input(s): AMMONIA in the last 168 hours. Coagulation Profile:  Recent Labs  Lab 10/20/16 0753  INR 1.79   Cardiac Enzymes: No results for input(s): CKTOTAL, CKMB, CKMBINDEX, TROPONINI in the last 168 hours. BNP (last 3 results) No results for input(s): PROBNP in the last 8760 hours. HbA1C: No results for input(s): HGBA1C in the last 72 hours. CBG: No results for input(s): GLUCAP in the last 168 hours. Lipid Profile: No results for input(s): CHOL, HDL, LDLCALC, TRIG, CHOLHDL, LDLDIRECT in the last 72 hours. Thyroid Function Tests: No results for input(s): TSH, T4TOTAL, FREET4, T3FREE, THYROIDAB in the last 72 hours. Anemia Panel: No results for input(s): VITAMINB12, FOLATE, FERRITIN, TIBC, IRON, RETICCTPCT in the last 72 hours. Sepsis Labs: No results for input(s): PROCALCITON, LATICACIDVEN in the last 168 hours.  No results found for this or any previous visit (from the past 240 hour(s)).     Radiology Studies: Dg Chest Port 1 View  Result Date: 10/19/2016 CLINICAL DATA:  Metastatic breast cancer EXAM: PORTABLE CHEST 1 VIEW COMPARISON:  06/01/2015, PET-CT 05/14/2016 FINDINGS: Left upper extremity catheter tip overlies the mid to low right atrium. Left pleural disease may be slightly increased at the left lung apex. Parenchymal opacity in the left upper lobe and left lung base  are similar compared to prior. Right lung is clear. Surgical clips in the right axilla. Stable heart size. No pneumothorax. IMPRESSION: 1. Left upper extremity catheter tip overlies the mid right atrium 2. Volume loss on the left with pleural and parenchymal opacity as before. Probable slight increase in left apical pleural opacity compared to prior radiograph. Electronically Signed   By: Donavan Foil M.D.   On: 10/19/2016 21:08      Scheduled Meds: . docusate sodium  100 mg Oral BID  . famotidine  40 mg Oral BID  . mouth rinse  15 mL Mouth Rinse BID   Continuous Infusions: . sodium chloride    . piperacillin-tazobactam Stopped (10/20/16 0442)     LOS: 1 day    Time spent: 60 minutes. 10:25am-11:25am, >50 % of time spent face to face and on the unit counseling and coordination of care with patient and family.   Dessa Phi, DO [facility] [email or web address] Password [email or web address] 10/20/2016, 11:22 AM

## 2016-10-20 NOTE — Progress Notes (Addendum)
Patient ID: [name] [name], female   DOB: [birth year], 56 y.o.   MRN: [id number] Biliary drain intact, small amount of drainage noted on gauze dressing. No apparent displacement of catheter. Site mildly tender to palpation. Recorded output 100 mL. Latest cholangiogram performed on 10/10/16 revealed stable positioning of the biliary drain without occlusion or obstruction. Case reviewed by Dr. Earleen Newport. Would not recommend any additional intervention at this time; hold on any drain flushing for now.

## 2016-10-20 NOTE — H&P (Signed)
History and Physical    [name] [name] FMB:[id number] DOB: Oct 17, 1960 DOA: 10/19/2016  PCP: Chauncey Cruel, MD  Patient coming from: Home.  Chief Complaint: Decreased urine output and chills.  HPI: [name] [name] is a 56 y.o. female with history of metastatic breast cancer, obstructive jaundice status post biliary drain placement, anemia was brought to the ER the patient was found to have decreasing urine output and poor oral intake and increasing weakness. Patient's husband who provided the history states that patient also had subjective feeling of fever or chills this afternoon. They had called the oncologist who advised to come to the ER. Denies any nausea vomiting or diarrhea.   ED Course: The ER patient appears weak. Labs reveal worsening of creatinine from 1.4-2.5-3.7 in the last 2 weeks. Bilirubin has increased from last week of 19.3-21.3. Patient appears weak and lethargic. But answers questions. On exam patient appears edematous. 2 L fluid bolus was given by the ER physician. Blood cultures were obtained and empirically antibiotic started since patient was complaining of chills and rigors.  Review of Systems: As per HPI, rest all negative.   Past Medical History:  Diagnosis Date  . Bone metastases ([place]) dx'd 05/2014  . Breast cancer ([place]) dx'd 2005/2011  . Peripheral vascular disease ([place]) 02/2010   blood clot related to porta cath  . PONV (postoperative nausea and vomiting)   . S/P radiation therapy 07/17/2014 through 08/02/2014    Left mediastinum, left seventh rib 3250 cGy in 13 sessions   . S/P radiation therapy 12/11/2014 through 12/22/2014    Left parietal calvarium 2400 cGy in 8 sessions   . Seizures ([place]) 2010   Isolated incident.    Past Surgical History:  Procedure Laterality Date  . AXILLARY LYMPH NODE  DISSECTION  Dec. 2011  . BREAST LUMPECTOMY  2005  . IR CHOLANGIOGRAM EXISTING TUBE  10/10/2016  . IR EXCHANGE BILIARY DRAIN  09/18/2016  . IR FLUORO GUIDE CV LINE LEFT  09/05/2016  . IR GENERIC HISTORICAL  05/21/2016   IR RADIOLOGIST EVAL & MGMT 05/21/2016 Sandi Mariscal, MD [facility] INTERV RAD  . IR INT EXT BILIARY DRAIN WITH CHOLANGIOGRAM  09/06/2016  . IR US GUIDE VASC ACCESS LEFT  09/05/2016  . MEDIASTINOTOMY CHAMBERLAIN MCNEIL Left 06/02/2013   Procedure: MEDIASTINOTOMY CHAMBERLAIN MCNEIL;  Surgeon: [name], MD;  Location: [facility];  Service: Thoracic;  Laterality: Left;  LEFT ANTERIOR MEDIASTINOTOMY   . PORTACATH PLACEMENT  12/11  . removal portacath       reports that she has never smoked. She has never used smokeless tobacco. She reports that she does not drink alcohol or use drugs.  Allergies  Allergen Reactions  . 2nd Skin Quick Heal Other (See Comments)    Other Reaction: Skin peels  . Decadron [Dexamethasone] Other (See Comments)    Patient does not tolerate steroids.   . Dilaudid [Hydromorphone] Nausea And Vomiting  . Enoxaparin Other (See Comments)    unknown  . Fluconazole Swelling    Liver toxicity  . Hydromorphone Hcl Nausea And Vomiting  . Morphine And Related Nausea And Vomiting  . Ondansetron Other (See Comments)    "makes me loopy"  . Protonix [Pantoprazole Sodium] Other (See Comments)    Patient reports it caused thrush.  . Tegaderm Ag Mesh [Silver]     Family History  Problem Relation Age of Onset  . COPD Mother   . Breast cancer Sister 37    Prior to Admission medications  Medication Sig Start Date End Date Taking? Authorizing Provider  calcium-vitamin D (OSCAL WITH D) 500-200 MG-UNIT tablet Take 2 tablets by mouth 2 (two) times daily. 09/11/16  Yes Magrinat, Virgie Dad, MD  cyclobenzaprine (FLEXERIL) 10 MG tablet Take 1 tablet by mouth daily as needed for muscle spasms. 10/10/16  Yes [provider]  docusate sodium (COLACE) 100 MG capsule  Take 1 capsule (100 mg total) by mouth 2 (two) times daily. 09/11/16  Yes Magrinat, Virgie Dad, MD  famotidine (PEPCID) 40 MG tablet Take 1 tablet (40 mg total) by mouth 2 (two) times daily. 09/21/16  Yes Ennever, Rudell Cobb, MD  ketorolac (TORADOL) 10 MG tablet Take 1 tablet (10 mg total) by mouth every 6 (six) hours as needed. Patient taking differently: Take 15 mg by mouth every 6 (six) hours.  09/11/16  Yes Magrinat, Virgie Dad, MD  LORazepam (ATIVAN) 0.5 MG tablet Take 1 tablet (0.5 mg total) by mouth every 8 (eight) hours as needed for anxiety. 08/30/16 08/30/17 Yes Gherghe, Vella Redhead, MD  Melatonin 3 MG TABS Take 3 mg by mouth at bedtime.   Yes [provider]  promethazine (PHENERGAN) 12.5 MG tablet Take 1 tablet (12.5 mg total) by mouth every 6 (six) hours as needed for nausea. 09/11/16  Yes Magrinat, Virgie Dad, MD  traMADol (ULTRAM) 50 MG tablet Take 1-2 tablets (50-100 mg total) by mouth every 6 (six) hours as needed. Patient taking differently: Take 50-100 mg by mouth every 6 (six) hours.  09/11/16  Yes Magrinat, Virgie Dad, MD  alum & mag hydroxide-simeth (MAALOX/MYLANTA) 200-200-20 MG/5ML suspension Take 30 mLs by mouth every 6 (six) hours as needed for indigestion, heartburn or flatulence (Bloating,gas pain). Patient not taking: Reported on 10/19/2016 09/11/16   Magrinat, Virgie Dad, MD  B Complex-C (B-COMPLEX WITH VITAMIN C) tablet Take 1 tablet by mouth daily. Reported on 03/27/2015 01/19/15   Magrinat, Virgie Dad, MD  betamethasone valerate ointment (VALISONE) 0.1 % Apply 1 application topically 2 (two) times daily. Patient not taking: Reported on 10/19/2016 09/15/16   Magrinat, Virgie Dad, MD  cholecalciferol 2000 UNITS tablet Take 1 tablet (2,000 Units total) by mouth daily. 01/19/15   Magrinat, Virgie Dad, MD  famotidine (PEPCID) 40 MG tablet Take 0.5-1 tablets (20-40 mg total) by mouth 2 (two) times daily. 40 mg in the morning and 20 mg in the evening Patient not taking: Reported on 10/19/2016 08/30/16    Caren Griffins, MD  fentaNYL (ACTIQ) 200 MCG lollipop Place 200 mcg inside cheek every 4 (four) hours as needed for pain. Patient not taking: Reported on 10/19/2016 09/23/16   Magrinat, Virgie Dad, MD  folic acid (FOLVITE) 1 MG tablet Take 1 tablet (1 mg total) by mouth daily. Patient not taking: Reported on 10/19/2016 09/21/16   Volanda Napoleon, MD  ketoconazole (NIZORAL) 2 % cream Apply 1 application topically daily. Patient not taking: Reported on 10/19/2016 10/08/16   Magrinat, Virgie Dad, MD  Morphine Sulfate (MORPHINE CONCENTRATE) 10 mg / 0.5 ml concentrated solution Take 10 mg by mouth every 2 (two) hours as needed for severe pain.  09/11/16   [provider]  polyethylene glycol (MIRALAX / GLYCOLAX) packet Take 17 g by mouth daily. Patient not taking: Reported on 10/19/2016 09/11/16   Magrinat, Virgie Dad, MD  triamterene-hydrochlorothiazide (DYAZIDE) 37.5-25 MG capsule Take 1 each (1 capsule total) by mouth daily as needed (weight gain). Patient not taking: Reported on 10/19/2016 09/15/16   Magrinat, Virgie Dad, MD  Physical Exam: Vitals:   10/19/16 1853 10/19/16 2017 10/19/16 2321 10/19/16 2358  BP: 95/71 100/61 (!) 105/57   Pulse: (!) 109 (!) 104 (!) 102   Resp: 20 14 14    Temp: (!) 97.4 F (36.3 C) (!) 97.5 F (36.4 C) (!) 97.5 F (36.4 C)   TempSrc: Oral Oral Oral   SpO2: 98% 96% 98%   Weight:    79.1 kg (174 lb 5 oz)  Height:    5\' 4"  (1.626 m)      Constitutional: Moderately built and nourished. Vitals:   10/19/16 1853 10/19/16 2017 10/19/16 2321 10/19/16 2358  BP: 95/71 100/61 (!) 105/57   Pulse: (!) 109 (!) 104 (!) 102   Resp: 20 14 14    Temp: (!) 97.4 F (36.3 C) (!) 97.5 F (36.4 C) (!) 97.5 F (36.4 C)   TempSrc: Oral Oral Oral   SpO2: 98% 96% 98%   Weight:    79.1 kg (174 lb 5 oz)  Height:    5\' 4"  (1.626 m)   Eyes: Icterus present with pallor. ENMT: No discharge from the ears eyes nose and mouth. Neck: No mass felt. No JVD appreciated. Respiratory:  No rhonchi or crepitations. Cardiovascular: S1-S2 heard no murmurs appreciated. Abdomen: Distended nontender bowel sounds present. Musculoskeletal: Bilateral lower extremity edema present. Skin: Icteric and pale. Neurologic: Lethargic but answers questions and moves all extremity is. Psychiatric: Mildly lethargic.   Labs on Admission: I have personally reviewed following labs and imaging studies  CBC:  Recent Labs Lab 10/14/16 1230 10/19/16 1900  WBC 9.4 23.3*  NEUTROABS 8.1* 22.3*  HGB 7.4* 8.2*  HCT 20.9* 22.7*  MCV 94.1 91.2  PLT 423* 295*   Basic Metabolic Panel:  Recent Labs Lab 10/14/16 1231 10/19/16 1900  NA 122* 121*  K 4.4 5.0  CL  --  89*  CO2 20* 19*  GLUCOSE 106 102*  BUN 35.5* 53*  CREATININE 2.5* 3.79*  CALCIUM 8.3* 8.2*   GFR: Estimated Creatinine Clearance: 16.9 mL/min (A) (by C-G formula based on SCr of 3.79 mg/dL (H)). Liver Function Tests:  Recent Labs Lab 10/14/16 1231 10/19/16 1900  AST 182* 212*  ALT 70* 69*  ALKPHOS 727* 835*  BILITOT 19.34* 21.3*  PROT 4.5* 5.0*  ALBUMIN 1.7* 1.8*    Recent Labs Lab 10/19/16 1900  LIPASE 56*   No results for input(s): AMMONIA in the last 168 hours. Coagulation Profile: No results for input(s): INR, PROTIME in the last 168 hours. Cardiac Enzymes: No results for input(s): CKTOTAL, CKMB, CKMBINDEX, TROPONINI in the last 168 hours. BNP (last 3 results) No results for input(s): PROBNP in the last 8760 hours. HbA1C: No results for input(s): HGBA1C in the last 72 hours. CBG: No results for input(s): GLUCAP in the last 168 hours. Lipid Profile: No results for input(s): CHOL, HDL, LDLCALC, TRIG, CHOLHDL, LDLDIRECT in the last 72 hours. Thyroid Function Tests: No results for input(s): TSH, T4TOTAL, FREET4, T3FREE, THYROIDAB in the last 72 hours. Anemia Panel: No results for input(s): VITAMINB12, FOLATE, FERRITIN, TIBC, IRON, RETICCTPCT in the last 72 hours. Urine analysis:    Component Value  Date/Time   COLORURINE AMBER (A) 08/26/2016 0334   APPEARANCEUR CLEAR 08/26/2016 0334   LABSPEC 1.015 08/26/2016 0334   LABSPEC 1.005 05/16/2016 1201   PHURINE 5.0 08/26/2016 0334   GLUCOSEU NEGATIVE 08/26/2016 0334   GLUCOSEU Negative 05/16/2016 1201   HGBUR SMALL (A) 08/26/2016 0334   BILIRUBINUR MODERATE (A) 08/26/2016 0334   BILIRUBINUR Negative  05/16/2016 1201   KETONESUR 20 (A) 08/26/2016 0334   PROTEINUR NEGATIVE 08/26/2016 0334   UROBILINOGEN 0.2 05/16/2016 1201   NITRITE NEGATIVE 08/26/2016 0334   LEUKOCYTESUR NEGATIVE 08/26/2016 0334   LEUKOCYTESUR Small 05/16/2016 1201   Sepsis Labs: @LABRCNTIP (procalcitonin:4,lacticidven:4) )No results found for this or any previous visit (from the past 240 hour(s)).   Radiological Exams on Admission: Dg Chest Port 1 View  Result Date: 10/19/2016 CLINICAL DATA:  Metastatic breast cancer EXAM: PORTABLE CHEST 1 VIEW COMPARISON:  06/01/2015, PET-CT 05/14/2016 FINDINGS: Left upper extremity catheter tip overlies the mid to low right atrium. Left pleural disease may be slightly increased at the left lung apex. Parenchymal opacity in the left upper lobe and left lung base are similar compared to prior. Right lung is clear. Surgical clips in the right axilla. Stable heart size. No pneumothorax. IMPRESSION: 1. Left upper extremity catheter tip overlies the mid right atrium 2. Volume loss on the left with pleural and parenchymal opacity as before. Probable slight increase in left apical pleural opacity compared to prior radiograph. Electronically Signed   By: Donavan Foil M.D.   On: 10/19/2016 21:08     Assessment/Plan Principal Problem:   Acute renal failure (ARF) (HCC) Active Problems:   Abnormal LFTs (liver function tests)   Normochromic normocytic anemia   Hyponatremia   ARF (acute renal failure) ([place])    1. Acute renal failure - patient appears to be third spacing at this time given the low albumin. Patient also has poor oral intake.  Check FENa. Patient has been on Toradol which we will discontinue and stop using NSAIDs. Patient has received 2 L normal saline bolus. Hesitant to give further fluids given patient's edematous state. I have ordered albumin infusion. Follow metabolic panel intake output. If there is no significant output may need to consider further imaging to rule out obstruction. 2. Hyponatremia - probably multifactorial including SIADH fluid overload but at this time could also be from decreased intake. Patient received 2 L normal saline bolus. Follow metabolic panel closely. 3. Abdominal discomfort with chills and rigors with obstructive jaundice status post drain placement - 4 blood cultures. Patient is on empiric antibiotics. Follow LFTs. 4. Metastatic breast cancer per oncologist. I have listed Dr. Jana Hakim. 5. Anemia probably related to cancer - follow CBC.  I have reviewed patient's old charts him last.   DVT prophylaxis: SCDs until INR results available. Code Status: DO NOT RESUSCITATE.  Family Communication: Patient's husband.  Disposition Plan: To be determined.  Consults called: Palliative care.  Admission status: Inpatient.    Rise Patience MD [facility] Pager [phone].  If 7PM-7AM, please contact night-coverage [email or web address] Password [facility]  10/20/2016, 12:52 AM

## 2016-10-20 NOTE — Progress Notes (Signed)
Pharmacy Antibiotic Note  [name] [name] is a 56 y.o. female admitted on 10/19/2016 with decreased oral intake, increased pain secondary to her metastatic cancer, anxiety, decreased urinary output. 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  Pharmacy has been consulted for vancomycin dosing.  Scr 3.79, CrCl ~25mls/min  Plan: Vancomycin 1gm IV q48h (1st dose not given in ED) Zosyn 2.25mg  IV q6h (per MD) Daily Scr Follow renal function, cultures and clinical course  Height: 5\' 4"  (162.6 cm) Weight: 174 lb 5 oz (79.1 kg) IBW/kg (Calculated) : 54.7  Temp (24hrs), Avg:97.5 F (36.4 C), Min:97.4 F (36.3 C), Max:97.5 F (36.4 C)   Recent Labs Lab 10/14/16 1230 10/14/16 1231 10/19/16 1900  WBC 9.4  --  23.3*  CREATININE  --  2.5* 3.79*    Estimated Creatinine Clearance: 16.9 mL/min (A) (by C-G formula based on SCr of 3.79 mg/dL (H)).    Allergies  Allergen Reactions  . 2nd Skin Quick Heal Other (See Comments)    Other Reaction: Skin peels  . Decadron [Dexamethasone] Other (See Comments)    Patient does not tolerate steroids.   . Dilaudid [Hydromorphone] Nausea And Vomiting  . Enoxaparin Other (See Comments)    unknown  . Fluconazole Swelling    Liver toxicity  . Hydromorphone Hcl Nausea And Vomiting  . Morphine And Related Nausea And Vomiting  . Ondansetron Other (See Comments)    "makes me loopy"  . Protonix [Pantoprazole Sodium] Other (See Comments)    Patient reports it caused thrush.  . Tegaderm Ag Mesh [Silver]     Antimicrobials this admission: 7/22 zosyn>> 7/23  Vanc>>   Microbiology results: 7/22 BCx:  Thank you for allowing pharmacy to be a part of this patient's care.  Dolly Rias RPh 10/20/2016, 12:57 AM Pager [phone]

## 2016-10-20 NOTE — Progress Notes (Signed)
MD notified of critical hgb. MD in to assess pt.

## 2016-10-20 NOTE — Consult Note (Signed)
Consultation Note Date: 10/20/2016   Patient Name: [name] [name]  DOB: 08-Jan-1961  MRN: [id number]  Age / Sex: 56 y.o., female  PCP: [name] [name], [name] Dad, MD Referring Physician: Baird [name]*  Reason for Consultation: Establishing goals of care  HPI/Patient Profile: 56 y.o. female  with past medical history of metastatic breast cancer to the lung, liver, bone who was admitted on 10/19/2016 with acute renal failure, hyponatremia and worsening abdominal pain. Per EPIC notes, she is followed by Dr. Jana [name].  Recently, after the patient developed obstructive jaundice and a biliary stent was placed, Hospice was recommended.  The patient initially enrolled in Hospice but then discontinued the services as she wanted to continue to fight.  Clinical Assessment and Goals of Care:  I have reviewed medical records including EPIC notes, labs and imaging, received report from the care team, assessed the patient and then met at the bedside along with her husband [name] [name]  to discuss diagnosis prognosis, [name] [name], EOL wishes, disposition and options.  I introduced Palliative Medicine as specialized medical care for people living with serious illness. It focuses on providing relief from the symptoms and stress of a serious illness. The goal is to improve quality of life for both the patient and the family.  [name] [name] was lethargic and slept on and off during the conversation.  [name] [name] explained that [name] [name] discontinued Hospice services because she felt like she was losing her expert team (Dr. Jana [name] and his staff).  [name] [name] is very accustomed to working with the Oncology team and trusts them.    I asked [name] [name] if he and [name] [name] ever discussed death.  He replied that they had and that she is very specific about what she wants.  She has had two close relatives die using comfort medications.  [name] [name] feels like the medications made them drowsy and  took away their personality at end of life.  She wants to remain as alert as possible as long as possible.  However, she also tells me she does not want to be in severe pain.  [name] [name] and I discussed that eventually her liver and kidney failure will effect her mind and make her drowsy and sleepy.  I asked questions around what is an acceptable quality of life for [name] [name].  [name] [name] was unsure how his wife would answer this and would like to discuss it more tomorrow when she is hopefully more awake.  I mentioned to [name] [name] that there are many medical interventions that could be considered life prolonging (blood transfusions, TPN, even kayexalate) but these wont change to progression of her cancer or her outcome.  How much medical intervention does  [name] want to continue to pursue?  We need to understand her goals for quality of life in order to determine the type of medical plan that would best suit her.  [name] [name] also expressed that [name] [name] has some distrust of the medical system.  "unexplainable things" have happened.  For example a pleurx Cath was placed many years ago for re-current pleural effusion.  After  1.5 years the pleurx cath was able to be removed and the fluid did not re-accumulate.  [name] [name] and [name] [name] feel that this was an improvement the doctors never anticipated - and it caused them to be more skeptical of medical opinion.    We discussed functional and nutritional status  - [name] [name] is able to walk a few steps with assistance.  Due to swelling in her feet walking is somewhat painful.  She is able to eat and is very careful about what she eats but her appetite is dwindling.  Questions and concerns were addressed.  Hard Choices booklet left for review. The family was encouraged to call with questions or concerns.   PMT will follow up 7/24.  Primary Decision Maker:  PATIENT and Husband [name] [name].    SUMMARY OF RECOMMENDATIONS    Patient's goals include remaining as alert and orientated as possible during end of life.   Therefore she does her best to avoid any mind altering medications including opioids, antihistamines, and benzodiazepines.  Code Status/Advance Care Planning:  DNR   Symptom Management:   Per primary team  Additional Recommendations (Limitations, Scope, Preferences):  Full Scope Treatment  Palliative Prophylaxis:   Frequent Pain Assessment  Psycho-social/Spiritual:   Desire for further Chaplaincy support: No (but patient is catholic)  Prognosis:   Unable to determine.  Likely days to weeks.  Discharge Planning: To Be Determined      Primary Diagnoses: Present on Admission: . Acute renal failure (ARF) ([place]) . Abnormal LFTs (liver function tests) . Normochromic normocytic anemia . Hyponatremia . ARF (acute renal failure) ([place])   I have reviewed the medical record, interviewed the patient and family, and examined the patient. The following aspects are pertinent.  Past Medical History:  Diagnosis Date  . Bone metastases ([place]) dx'd 05/2014  . Breast cancer ([place]) dx'd 2005/2011  . Peripheral vascular disease ([employer]) 02/2010   blood clot related to porta cath  . PONV (postoperative nausea and vomiting)   . S/P radiation therapy 07/17/2014 through 08/02/2014    Left mediastinum, left seventh rib 3250 cGy in 13 sessions   . S/P radiation therapy 12/11/2014 through 12/22/2014    Left parietal calvarium 2400 cGy in 8 sessions   . Seizures ([place]) 2010   Isolated incident.   Social History   Social History  . Marital status: Married    Spouse name: N/A  . Number of children: N/A  . Years of education: N/A   Social History Main Topics  . Smoking status: Never Smoker  . Smokeless tobacco: Never Used  . Alcohol use No  . Drug use: No  . Sexual activity: Yes   Other Topics Concern  . None   Social History Narrative    . None   Family History  Problem Relation Age of Onset  . COPD Mother   . Breast cancer Sister 45   Scheduled Meds: . docusate sodium  100 mg Oral BID  . famotidine  40 mg Oral BID  . mouth rinse  15 mL Mouth Rinse BID   Continuous Infusions: . piperacillin-tazobactam Stopped (10/20/16 1244)   PRN Meds:.cyclobenzaprine, diphenhydrAMINE-zinc acetate, fentaNYL (SUBLIMAZE) injection, LORazepam, sodium chloride flush Allergies  Allergen Reactions  . 2nd Skin Quick Heal Other (See Comments)    Other Reaction: Skin peels  . Decadron [Dexamethasone] Other (See Comments)    Patient does not tolerate steroids.   . Dilaudid [Hydromorphone] Nausea And Vomiting  . Enoxaparin Other (See Comments)    unknown  .  Fluconazole Swelling    Liver toxicity  . Hydromorphone Hcl Nausea And Vomiting  . Morphine And Related Nausea And Vomiting  . Ondansetron Other (See Comments)    "makes me loopy"  . Protonix [Pantoprazole Sodium] Other (See Comments)    Patient reports it caused thrush.  . Tegaderm Ag Mesh [Silver]    Review of Systems patient lethargic  Physical Exam  Well developed female 3+ jaundice, lethargic   Vital Signs: BP 102/66 (BP Location: Left Leg)   Pulse (!) 101   Temp 98 F (36.7 C) (Oral)   Resp 20   Ht 5' 4"  (1.626 m)   Wt 82.2 kg (181 lb 3.5 oz)   SpO2 100%   BMI 31.11 kg/m  Pain Assessment: 0-10   Pain Score: 1    SpO2: SpO2: 100 % O2 Device:SpO2: 100 % O2 Flow Rate: .   IO: Intake/output summary:   Intake/Output Summary (Last 24 hours) at 10/20/16 1653 Last data filed at 10/20/16 1214  Gross per 24 hour  Intake              500 ml  Output              500 ml  Net                0 ml    LBM: Last BM Date: 10/19/16 Baseline Weight: Weight: 79.1 kg (174 lb 5 oz) Most recent weight: Weight: 82.2 kg (181 lb 3.5 oz)     Palliative Assessment/Data:   Flowsheet Rows     Most Recent Value  Intake Tab  Referral Department  Hospitalist  Unit at Time  of Referral  Oncology Unit  Palliative Care Primary Diagnosis  Cancer  Date Notified  10/20/16  Palliative Care Type  New Palliative care  Reason for referral  Clarify Goals of Care  Date of Admission  10/19/16  Date first seen by Palliative Care  10/20/16  # of days Palliative referral response time  0 Day(s)  # of days IP prior to Palliative referral  1  Clinical Assessment  Palliative Performance Scale Score  30%  Psychosocial & Spiritual Assessment  Palliative Care Outcomes  Patient/Family meeting held?  Yes  Who was at the meeting?  patient and husband [name] [name]  Palliative Care Outcomes  Clarified goals of care      Time In: 2:30 Time Out: 3:40 Time Total: 70 min. Greater than 50%  of this time was spent counseling and coordinating care related to the above assessment and plan.  Signed by: Florentina Jenny, PA-C Palliative Medicine Pager: [phone]  Please contact Palliative Medicine Team phone at [phone] for questions and concerns.  For individual provider: See Shea Evans

## 2016-10-20 NOTE — Consult Note (Signed)
[facility] Nurse wound consult note Reason for Consult:stage 2 pressure injury, pressure and moisture to left gluteal region, near fold.  Wound type:pressure and moisture Pressure Injury POA: Yes Measurement: 2 cm x 1 cm x 0.2 cm  Wound IWP:YKDX and moist Drainage (amount, consistency, odor) scant serosanguinous  No odor.  Periwound: dark, nonblanchable erythema present circumferentially.  Dressing procedure/placement/frequency:Discourage use of donut cushion.  Explained that this increases pressure to this area and is of no benefit.  Verbalizes understanding.  Cleanse wound to left buttock with NS and pat gently dry.  Apply silicone border foam dressing. Change every three days and PRN soilage.  Reposition every two hours.  Avoid use of donut cushion.  Will not follow at this time.  Please re-consult if needed.  Domenic Moras RN BSN [place] Pager [phone]

## 2016-10-21 ENCOUNTER — Encounter: Payer: Self-pay | Admitting: *Deleted

## 2016-10-21 ENCOUNTER — Encounter: Payer: 59 | Admitting: Physical Therapy

## 2016-10-21 ENCOUNTER — Encounter: Payer: Self-pay | Admitting: Physical Therapy

## 2016-10-21 ENCOUNTER — Telehealth: Payer: Self-pay | Admitting: *Deleted

## 2016-10-21 DIAGNOSIS — R52 Pain, unspecified: Secondary | ICD-10-CM

## 2016-10-21 DIAGNOSIS — C50411 Malignant neoplasm of upper-outer quadrant of right female breast: Secondary | ICD-10-CM

## 2016-10-21 LAB — BLOOD CULTURE ID PANEL (REFLEXED)
Acinetobacter baumannii: NOT DETECTED
CANDIDA KRUSEI: NOT DETECTED
Candida albicans: NOT DETECTED
Candida glabrata: NOT DETECTED
Candida parapsilosis: NOT DETECTED
Candida tropicalis: NOT DETECTED
ENTEROCOCCUS SPECIES: NOT DETECTED
Enterobacter cloacae complex: NOT DETECTED
Enterobacteriaceae species: NOT DETECTED
Escherichia coli: NOT DETECTED
Haemophilus influenzae: NOT DETECTED
Klebsiella oxytoca: NOT DETECTED
Klebsiella pneumoniae: NOT DETECTED
LISTERIA MONOCYTOGENES: NOT DETECTED
Methicillin resistance: DETECTED — AB
NEISSERIA MENINGITIDIS: NOT DETECTED
PROTEUS SPECIES: NOT DETECTED
Pseudomonas aeruginosa: NOT DETECTED
SERRATIA MARCESCENS: NOT DETECTED
STAPHYLOCOCCUS AUREUS BCID: NOT DETECTED
STAPHYLOCOCCUS SPECIES: DETECTED — AB
STREPTOCOCCUS AGALACTIAE: NOT DETECTED
STREPTOCOCCUS SPECIES: NOT DETECTED
Streptococcus pneumoniae: NOT DETECTED
Streptococcus pyogenes: NOT DETECTED

## 2016-10-21 LAB — BASIC METABOLIC PANEL
Anion gap: 12 (ref 5–15)
BUN: 48 mg/dL — AB (ref 6–20)
CO2: 18 mmol/L — AB (ref 22–32)
CREATININE: 2.8 mg/dL — AB (ref 0.44–1.00)
Calcium: 7.5 mg/dL — ABNORMAL LOW (ref 8.9–10.3)
Chloride: 93 mmol/L — ABNORMAL LOW (ref 101–111)
GFR calc Af Amer: 21 mL/min — ABNORMAL LOW (ref >60)
GFR calc non Af Amer: 18 mL/min — ABNORMAL LOW (ref >60)
GLUCOSE: 104 mg/dL — AB (ref 65–99)
Potassium: 4.3 mmol/L (ref 3.5–5.1)
Sodium: 123 mmol/L — ABNORMAL LOW (ref 135–145)

## 2016-10-21 LAB — TYPE AND SCREEN
ABO/RH(D): AB POS
Antibody Screen: NEGATIVE
UNIT DIVISION: 0

## 2016-10-21 LAB — BPAM RBC
BLOOD PRODUCT EXPIRATION DATE: 201808072359
ISSUE DATE / TIME: 201807232349
Unit Type and Rh: 6200

## 2016-10-21 LAB — CBC WITH DIFFERENTIAL/PLATELET
BASOS PCT: 0 %
Basophils Absolute: 0 10*3/uL (ref 0.0–0.1)
EOS ABS: 0.1 10*3/uL (ref 0.0–0.7)
Eosinophils Relative: 1 %
HCT: 26.4 % — ABNORMAL LOW (ref 36.0–46.0)
HEMOGLOBIN: 9.6 g/dL — AB (ref 12.0–15.0)
LYMPHS ABS: 1.2 10*3/uL (ref 0.7–4.0)
Lymphocytes Relative: 8 %
MCH: 32.5 pg (ref 26.0–34.0)
MCHC: 36.4 g/dL — AB (ref 30.0–36.0)
MCV: 89.5 fL (ref 78.0–100.0)
MONO ABS: 0.1 10*3/uL (ref 0.1–1.0)
MONOS PCT: 1 %
NEUTROS PCT: 91 %
Neutro Abs: 14.7 10*3/uL — ABNORMAL HIGH (ref 1.7–7.7)
Platelets: 364 10*3/uL (ref 150–400)
RBC: 2.95 MIL/uL — ABNORMAL LOW (ref 3.87–5.11)
RDW: 16.5 % — AB (ref 11.5–15.5)
WBC: 16.1 10*3/uL — ABNORMAL HIGH (ref 4.0–10.5)

## 2016-10-21 LAB — HEPATIC FUNCTION PANEL
ALK PHOS: 776 U/L — AB (ref 38–126)
ALT: 84 U/L — AB (ref 14–54)
AST: 341 U/L — AB (ref 15–41)
Albumin: 2.1 g/dL — ABNORMAL LOW (ref 3.5–5.0)
BILIRUBIN DIRECT: 12.5 mg/dL — AB (ref 0.1–0.5)
BILIRUBIN TOTAL: 21 mg/dL — AB (ref 0.3–1.2)
Indirect Bilirubin: 8.5 mg/dL — ABNORMAL HIGH (ref 0.3–0.9)
Total Protein: 5 g/dL — ABNORMAL LOW (ref 6.5–8.1)

## 2016-10-21 MED ORDER — LOPERAMIDE HCL 2 MG PO CAPS: 2.0000 mg | ORAL_CAPSULE | ORAL | 0 refills | Status: AC | PRN

## 2016-10-21 MED ORDER — KETOROLAC TROMETHAMINE 10 MG PO TABS: 10.0000 mg | ORAL_TABLET | Freq: Every day | ORAL | 0 refills | Status: AC | PRN

## 2016-10-21 MED ORDER — AMOXICILLIN-POT CLAVULANATE 875-125 MG PO TABS: 1.0000 | ORAL_TABLET | Freq: Two times a day (BID) | ORAL | 0 refills | Status: AC

## 2016-10-21 MED ORDER — LOPERAMIDE HCL 2 MG PO CAPS
2.0000 mg | ORAL_CAPSULE | Freq: Once | ORAL | Status: AC
  Administered 2016-10-21: 2 mg via ORAL
  Filled 2016-10-21: qty 1

## 2016-10-21 MED ORDER — TRAMADOL HCL 50 MG PO TABS: 50.0000 mg | ORAL_TABLET | Freq: Four times a day (QID) | ORAL | 0 refills | Status: AC | PRN

## 2016-10-21 NOTE — Discharge Summary (Addendum)
Physician Discharge Summary  [name] [name] ION:[id number] DOB: Oct 27, 1960 DOA: 10/19/2016  PCP: Chauncey Cruel, MD  Admit date: 10/19/2016 Discharge date: 10/21/2016  Admitted From: Home Disposition:  Home  Recommendations for Outpatient Follow-up:  1. Follow up with Dr. Jana Hakim in 1 week  2. Follow up outpatient palliative care services. She would benefit from support and transition to hospice conversation.  3. Home health to draw CMP on 7/27 and every Wednesday, with results called to Dr. Virgie Dad office [phone])  4. Please follow up on blood culture results    Home Health: RN  Equipment/Devices: [facility]    Discharge Condition: Stable, but terminal  CODE STATUS: DNR  Diet recommendation: Heart healthy, fluid and salt restriction  Wound care: Stage 2 pressure injury, pressure and moisture to left gluteal region, near fold. Cleanse wound to left buttock with NS and pat gently dry.  Apply silicone border foam dressing. Change every three days and PRN soilage.  Reposition every two hours.  Avoid use of donut cushion.   Brief/Interim Summary: From H&P by Dr. Hal Hope: [name] [name] is a 56 y.o. female with history of metastatic breast cancer, obstructive jaundice status post biliary drain placement, anemia was brought to the ER the patient was found to have decreasing urine output and poor oral intake and increasing weakness. Patient's husband who provided the history states that patient also had subjective feeling of fever or chills this afternoon. They had called the oncologist who advised to come to the ER. Denies any nausea vomiting or diarrhea.   ED Course: The ER patient appears weak. Labs reveal worsening of creatinine from 1.4-2.5-3.7 in the last 2 weeks. Bilirubin has increased from last week of 19.3-21.3. Patient appears weak and lethargic. But answers questions. On exam patient appears edematous. 2 L fluid bolus was given by the ER physician. Blood cultures were  obtained and empirically antibiotic started since patient was complaining of chills and rigors.  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 Hold additional fluids. Will need outpatient follow up for Cr.   Sepsis secondary to cholangitis  -Biliary drain in place. Appreciate IR, they recommend dressing changes prn  -Will transition to Augmentin. Patient refusing to take antibiotics for more than couple days after discharge. Please follow up on resolution of leukocytosis and blood culture results   Stage IV breast cancer with mets to liver and obstructive jaundice s/p perc biliary drain placement 09/06/16 with catheter exchange and upsizing 09/18/16  -Follows with Dr. Jana Hakim as outpatient, IR following as well  -Bilirubin remains elevated, but stable. Trend LFT -Reviewed Dr. Virgie Dad recent office notes. Seems to be a complicated/difficult situation and now progressive renal insufficiency. Unclear that there is much he can offer. Patient had previously been enrolled with home hospice, but decided to pursue medical care and has since un-enrolled with hospice. Dr. Virgie Dad note states that if situation gets worse, hospice may be pursued. Appreciate Palliative care consult  -Patient to have close follow up with Dr. Jana Hakim.  -Pain control per Dr. Jana Hakim: Ultram (tramadol) 50-100 mg po QID PRN. If that is insufficient she can use one toradol a day, max. Also has narcotics at home prn   Hypervolemic hyponatremia -Has been on fluid restriction per Dr. Jana Hakim -Will hold further IVF  -Na stable currently. Follow Na level closely   Anemia of chronic disease -Improved after 1u pRBC   Left gluteal skin  abrasion -Without acute infection -Wound care consulted, dressing changes as above   Chronic constipation -Colace BID    Discharge Instructions  Discharge Instructions    Call MD  for:  severe uncontrolled pain    Complete by:  As directed    Call MD for:  temperature >100.4    Complete by:  As directed    Diet - low sodium heart healthy    Complete by:  As directed    Discharge instructions    Complete by:  As directed    You were cared for by a hospitalist during your hospital stay. If you have any questions about your discharge medications or the care you received while you were in the hospital after you are discharged, you can call the unit and asked to speak with the hospitalist on call if the hospitalist that took care of you is not available. Once you are discharged, your primary care physician will handle any further medical issues. Please note that NO REFILLS for any discharge medications will be authorized once you are discharged, as it is imperative that you return to your primary care physician (or establish a relationship with a primary care physician if you do not have one) for your aftercare needs so that they can reassess your need for medications and monitor your lab values.   Discharge wound care:    Complete by:  As directed    Cleanse wound to left buttock with NS and pat gently dry.  Apply silicone border foam dressing. Change every three days and PRN soilage.  Reposition every two hours.  Avoid use of donut cushion.   For home use only DME Bedside commode    Complete by:  As directed    Patient needs a bedside commode to treat with the following condition:   Metastatic breast cancer [facility]) Physical deconditioning Weakness     Increase activity slowly    Complete by:  As directed      Allergies as of 10/21/2016      Reactions   2nd Skin Quick Heal Other (See Comments)   Other Reaction: Skin peels   Decadron [dexamethasone] Other (See Comments)   Patient does not tolerate steroids.    Dilaudid [hydromorphone] Nausea And Vomiting   Enoxaparin Other (See Comments)   unknown   Fluconazole Swelling   Liver toxicity   Hydromorphone Hcl Nausea And  Vomiting   Morphine And Related Nausea And Vomiting   Ondansetron Other (See Comments)   "makes me loopy"   Protonix [pantoprazole Sodium] Other (See Comments)   Patient reports it caused thrush.   Tegaderm Ag Mesh [silver]       Medication List    STOP taking these medications   alum & mag hydroxide-simeth 200-200-20 MG/5ML suspension Commonly known as:  MAALOX/MYLANTA   triamterene-hydrochlorothiazide 37.5-25 MG capsule Commonly known as:  DYAZIDE     TAKE these medications   amoxicillin-clavulanate 875-125 MG tablet Commonly known as:  AUGMENTIN Take 1 tablet by mouth 2 (two) times daily.   B-complex with vitamin C tablet Take 1 tablet by mouth daily. Reported on 03/27/2015   betamethasone valerate ointment 0.1 % Commonly known as:  VALISONE Apply 1 application topically 2 (two) times daily.   calcium-vitamin D 500-200 MG-UNIT tablet Commonly known as:  OSCAL WITH D Take 2 tablets by mouth 2 (two) times daily.   cyclobenzaprine 10 MG tablet Commonly known as:  FLEXERIL Take 1 tablet by mouth daily as needed for muscle spasms.  docusate sodium 100 MG capsule Commonly known as:  COLACE Take 1 capsule (100 mg total) by mouth 2 (two) times daily.   famotidine 40 MG tablet Commonly known as:  PEPCID Take 1 tablet (40 mg total) by mouth 2 (two) times daily. What changed:  Another medication with the same name was removed. Continue taking this medication, and follow the directions you see here.   fentaNYL 200 MCG lollipop Commonly known as:  ACTIQ Place 200 mcg inside cheek every 4 (four) hours as needed for pain.   folic acid 1 MG tablet Commonly known as:  FOLVITE Take 1 tablet (1 mg total) by mouth daily.   ketoconazole 2 % cream Commonly known as:  NIZORAL Apply 1 application topically daily.   ketorolac 10 MG tablet Commonly known as:  TORADOL Take 1 tablet (10 mg total) by mouth daily as needed. What changed:  when to take this   LORazepam 0.5 MG  tablet Commonly known as:  ATIVAN Take 1 tablet (0.5 mg total) by mouth every 8 (eight) hours as needed for anxiety.   Melatonin 3 MG Tabs Take 3 mg by mouth at bedtime.   morphine CONCENTRATE 10 mg / 0.5 ml concentrated solution Take 10 mg by mouth every 2 (two) hours as needed for severe pain.   polyethylene glycol packet Commonly known as:  MIRALAX / GLYCOLAX Take 17 g by mouth daily.   promethazine 12.5 MG tablet Commonly known as:  PHENERGAN Take 1 tablet (12.5 mg total) by mouth every 6 (six) hours as needed for nausea.   traMADol 50 MG tablet Commonly known as:  ULTRAM Take 1-2 tablets (50-100 mg total) by mouth every 6 (six) hours as needed. What changed:  when to take this  reasons to take this   Vitamin D 2000 units tablet Take 1 tablet (2,000 Units total) by mouth daily.            Durable Medical Equipment        Start     Ordered   10/21/16 0000  For home use only DME Bedside commode    Question Answer Comment  Patient needs a bedside commode to treat with the following condition Metastatic breast cancer [facility])   Patient needs a bedside commode to treat with the following condition Physical deconditioning   Patient needs a bedside commode to treat with the following condition Weakness      10/21/16 0843      Allergies  Allergen Reactions  . 2nd Skin Quick Heal Other (See Comments)    Other Reaction: Skin peels  . Decadron [Dexamethasone] Other (See Comments)    Patient does not tolerate steroids.   . Dilaudid [Hydromorphone] Nausea And Vomiting  . Enoxaparin Other (See Comments)    unknown  . Fluconazole Swelling    Liver toxicity  . Hydromorphone Hcl Nausea And Vomiting  . Morphine And Related Nausea And Vomiting  . Ondansetron Other (See Comments)    "makes me loopy"  . Protonix [Pantoprazole Sodium] Other (See Comments)    Patient reports it caused thrush.  . Tegaderm Ag Mesh [Silver]      Consultations:  Oncology  IR   Procedures/Studies: Dg Chest Port 1 View  Result Date: 10/19/2016 CLINICAL DATA:  Metastatic breast cancer EXAM: PORTABLE CHEST 1 VIEW COMPARISON:  06/01/2015, PET-CT 05/14/2016 FINDINGS: Left upper extremity catheter tip overlies the mid to low right atrium. Left pleural disease may be slightly increased at the left lung apex. Parenchymal opacity in  the left upper lobe and left lung base are similar compared to prior. Right lung is clear. Surgical clips in the right axilla. Stable heart size. No pneumothorax. IMPRESSION: 1. Left upper extremity catheter tip overlies the mid right atrium 2. Volume loss on the left with pleural and parenchymal opacity as before. Probable slight increase in left apical pleural opacity compared to prior radiograph. Electronically Signed   By: Donavan Foil M.D.   On: 10/19/2016 21:08   Ir Cholangiogram Existing Tube  Result Date: 10/10/2016 INDICATION: METASTATIC BREAST CANCER TO THE LIVER WITH OBSTRUCTIVE JAUNDICE, STATUS POST LEFT INTERNAL EXTERNAL BILIARY DRAIN, LEAKAGE AT THE SKIN SITE EXAM: CHOLANGIOGRAM THROUGH EXISTING INTERNAL EXTERNAL BILIARY DRAIN MEDICATIONS: NONE. ANESTHESIA/SEDATION: None. FLUOROSCOPY TIME:  Fluoroscopy Time:  12 seconds (4 mGy). COMPLICATIONS: None immediate. PROCEDURE: Informed written consent was obtained from the patient after a thorough discussion of the procedural risks, benefits and alternatives. All questions were addressed. Maximal Sterile Barrier Technique was utilized including caps, mask, sterile gowns, sterile gloves, sterile drape, hand hygiene and skin antiseptic. A timeout was performed prior to the initiation of the procedure. Under sterile conditions, the existing left internal external 12 [place] biliary drain was injected with contrast easily. Imaging performed. Biliary drain is widely patent. Contrast easily flowed through the tube into the duodenum. Minimal reflux of contrast into the  bile ducts at the biliary confluence. No evidence of tubal occlusion or obstruction. Stable drain position. IMPRESSION: Stable position of the left internal external 12 [place] biliary drain without occlusion or obstruction. Electronically Signed   By: Jerilynn Mages.  Shick M.D.   On: 10/10/2016 12:50      Discharge Exam: Vitals:   10/21/16 0016 10/21/16 0304  BP: 134/67 (!) 137/94  Pulse: (!) 103 99  Resp: 18 18  Temp: 98.7 F (37.1 C) 98.5 F (36.9 C)   Vitals:   10/20/16 2100 10/20/16 2351 10/21/16 0016 10/21/16 0304  BP: 118/63 130/78 134/67 (!) 137/94  Pulse: 96 99 (!) 103 99  Resp: 18 18 18 18   Temp: 98.2 F (36.8 C) 98.1 F (36.7 C) 98.7 F (37.1 C) 98.5 F (36.9 C)  TempSrc: Oral Oral Oral Oral  SpO2: 97% 98% 97% 98%  Weight:      Height:        General: Pt is alert, awake, not in acute distress, jaundiced and chronically ill appearing  Cardiovascular: RRR, S1/S2 +, no rubs, no gallops Respiratory: CTA bilaterally, no wheezing, no rhonchi Abdominal: Soft, NT, ND, bowel sounds +. +biliary drain in place  Extremities: +4 pitting edema    The results of significant diagnostics from this hospitalization (including imaging, microbiology, ancillary and laboratory) are listed below for reference.     Microbiology: No results found for this or any previous visit (from the past 240 hour(s)).   Labs: BNP (last 3 results) No results for input(s): BNP in the last 8760 hours. Basic Metabolic Panel:  Recent Labs Lab 10/14/16 1231 10/19/16 1900 10/20/16 0753 10/21/16 0612  NA 122* 121* 122* 123*  K 4.4 5.0 4.6 4.3  CL  --  89* 92* 93*  CO2 20* 19* 17* 18*  GLUCOSE 106 102* 114* 104*  BUN 35.5* 53* 49* 48*  CREATININE 2.5* 3.79* 3.34* 2.80*  CALCIUM 8.3* 8.2* 7.6* 7.5*   Liver Function Tests:  Recent Labs Lab 10/14/16 1231 10/19/16 1900 10/20/16 0753 10/21/16 0612  AST 182* 212* 326* 341*  ALT 70* 69* 80* 84*  ALKPHOS 727* 835* 703* 776*  BILITOT 19.34*  21.3*  19.1* 21.0*  PROT 4.5* 5.0* 4.8* 5.0*  ALBUMIN 1.7* 1.8* 2.1* 2.1*    Recent Labs Lab 10/19/16 1900  LIPASE 56*   No results for input(s): AMMONIA in the last 168 hours. CBC:  Recent Labs Lab 10/14/16 1230 10/19/16 1900 10/20/16 0753 10/21/16 0612  WBC 9.4 23.3* 18.5* 16.1*  NEUTROABS 8.1* 22.3*  --  14.7*  HGB 7.4* 8.2* 6.9* 9.6*  HCT 20.9* 22.7* 19.2* 26.4*  MCV 94.1 91.2 91.9 89.5  PLT 423* 458* 379 364   Cardiac Enzymes: No results for input(s): CKTOTAL, CKMB, CKMBINDEX, TROPONINI in the last 168 hours. BNP: Invalid input(s): POCBNP CBG: No results for input(s): GLUCAP in the last 168 hours. D-Dimer No results for input(s): DDIMER in the last 72 hours. Hgb A1c No results for input(s): HGBA1C in the last 72 hours. Lipid Profile No results for input(s): CHOL, HDL, LDLCALC, TRIG, CHOLHDL, LDLDIRECT in the last 72 hours. Thyroid function studies No results for input(s): TSH, T4TOTAL, T3FREE, THYROIDAB in the last 72 hours.  Invalid input(s): FREET3 Anemia work up No results for input(s): VITAMINB12, FOLATE, FERRITIN, TIBC, IRON, RETICCTPCT in the last 72 hours. Urinalysis    Component Value Date/Time   COLORURINE AMBER (A) 10/20/2016 0403   APPEARANCEUR HAZY (A) 10/20/2016 0403   LABSPEC 1.015 10/20/2016 0403   LABSPEC 1.005 05/16/2016 1201   PHURINE 5.0 10/20/2016 0403   GLUCOSEU NEGATIVE 10/20/2016 0403   GLUCOSEU Negative 05/16/2016 1201   HGBUR SMALL (A) 10/20/2016 0403   BILIRUBINUR MODERATE (A) 10/20/2016 0403   BILIRUBINUR Negative 05/16/2016 1201   KETONESUR NEGATIVE 10/20/2016 0403   PROTEINUR NEGATIVE 10/20/2016 0403   UROBILINOGEN 0.2 05/16/2016 1201   NITRITE NEGATIVE 10/20/2016 0403   LEUKOCYTESUR NEGATIVE 10/20/2016 0403   LEUKOCYTESUR Small 05/16/2016 1201   Sepsis Labs Invalid input(s): PROCALCITONIN,  WBC,  LACTICIDVEN Microbiology No results found for this or any previous visit (from the past 240 hour(s)).   Time coordinating  discharge: 40 minutes  SIGNED:  Dessa Phi, DO [facility] Pager [phone]  If 7PM-7AM, please contact night-coverage [email or web address] Password [facility] 10/21/2016, 8:47 AM

## 2016-10-21 NOTE — Progress Notes (Signed)
Spoke with pt and husband at bedside states, "we already have [place] and private duty sitters." [facility] in house aware.

## 2016-10-21 NOTE — Progress Notes (Unsigned)
Results of Positive Blood Culture given to Marlon Pel RN by Nadara Mustard at 4:00 pm on 10/21/2016

## 2016-10-21 NOTE — Progress Notes (Signed)
PT Cancellation Note / Screen  Patient Details Name: [name] [name] MRN: [id number] DOB: 1961/01/21   Cancelled Treatment:    Reason Eval/Treat Not Completed: PT screened, no needs identified, will sign off Pt discharged.  Plan for palliative care at home.  HHRN and equipment have already been ordered.  Per chart review, pt under current treatment for lymphedema at outpatient rehab location.  Will defer PT needs to OP PT and Dr. Jana Hakim.  PT to sign off.   [name] [name],KATHrine E 10/21/2016, 12:15 PM Carmelia Bake, PT, DPT 10/21/2016 Pager: [phone]

## 2016-10-21 NOTE — Telephone Encounter (Signed)
"  Patient had positive Blood cultures.  Was discharged today.  What's your lab number to let them know?" Provided [facility] lab number. Information also left for collaborative.

## 2016-10-21 NOTE — Telephone Encounter (Signed)
Per call report from [facility] in micro/lab inpatient and our Triage nurse - positive blood culture report given to MD.

## 2016-10-21 NOTE — Progress Notes (Signed)
Patient discharged to home via family car. Discharge instructions reviewed, RX given to family.

## 2016-10-21 NOTE — Progress Notes (Signed)
I greatly appreciate Dr Jeannine Kitten and the palliative care team's as well as IR's help to this patient!  In brief: [name] [name] has terminal, irreversible metastatic breast cancer which I believe will take her life in the next few weeks. The patient understands we have no further life-prolonging treatment available and she has an out-of-facility DNR order in place. On the other hand, [name] [name] wants to stay on top of things and control what can be controlled as long as possible. This can complicate our end-stage planning (it is not simply comfort care) but we can work with her and her husband so she remains in control as long as possible.  I feel there is little more we can do this admission and she can be discharged home. They will need a BSC. They already have an aide they pay for out of pocket helping 10 AM - 4 PM 7 days a week. They are helped also by [facility] and that service will need to be resumed-- they come every M/W/F for wound care and to service the lines. They can also draw labs (C met only) 7/27 and every Weds after that, with results to my office ([phone])--please make sure this is set up at the time of discharge  As far as pain management is concerned, she will use ultram (tramadol) 50-100 mg po QID PRN. If that is insufficient she can use one toradol a day, max. If that ios insufficient she has narcotics at home that can be used.   They are very good at bowel prophylaxis.   I will arrange for outpatient follow-up  Again, thank you for your excellent care of this complex patient!

## 2016-10-22 ENCOUNTER — Encounter: Payer: 59 | Admitting: Physical Therapy

## 2016-10-22 ENCOUNTER — Ambulatory Visit: Payer: 59 | Admitting: Oncology

## 2016-10-22 ENCOUNTER — Other Ambulatory Visit: Payer: 59

## 2016-10-23 ENCOUNTER — Ambulatory Visit: Payer: 59 | Admitting: Physical Therapy

## 2016-10-23 ENCOUNTER — Telehealth: Payer: Self-pay

## 2016-10-23 ENCOUNTER — Telehealth: Payer: Self-pay | Admitting: *Deleted

## 2016-10-23 DIAGNOSIS — I890 Lymphedema, not elsewhere classified: Secondary | ICD-10-CM | POA: Diagnosis present

## 2016-10-23 DIAGNOSIS — M62838 Other muscle spasm: Secondary | ICD-10-CM | POA: Diagnosis present

## 2016-10-23 DIAGNOSIS — M6289 Other specified disorders of muscle: Secondary | ICD-10-CM

## 2016-10-23 LAB — CULTURE, BLOOD (ROUTINE X 2): Special Requests: ADEQUATE

## 2016-10-23 NOTE — Therapy (Addendum)
[place], [place], [address] Phone: [phone]   Fax:  [phone]  Physical Therapy Treatment  Patient Details  Name: [name] [name] MRN: [id number] Date of Birth: 1960/10/14 Referring Provider: Dr. Kyung Rudd  Encounter Date: 10/23/2016      PT End of Session - 10/23/16 1724    Visit Number 34  51 for lymph   Number of Visits 68  for lymphedema   Date for PT Re-Evaluation 12/08/16   Authorization - Number of Visits 140   PT Start Time 1520   PT Stop Time 1608   PT Time Calculation (min) 48 min   Activity Tolerance Patient tolerated treatment well   Behavior During Therapy [facility] for tasks assessed/performed      Past Medical History:  Diagnosis Date  . Bone metastases ([place]) dx'd 05/2014  . Breast cancer ([place]) dx'd 2005/2011  . Peripheral vascular disease ([place]) 02/2010   blood clot related to porta cath  . PONV (postoperative nausea and vomiting)   . S/P radiation therapy 07/17/2014 through 08/02/2014    Left mediastinum, left seventh rib 3250 cGy in 13 sessions   . S/P radiation therapy 12/11/2014 through 12/22/2014    Left parietal calvarium 2400 cGy in 8 sessions   . Seizures ([place]) 2010   Isolated incident.    Past Surgical History:  Procedure Laterality Date  . AXILLARY LYMPH NODE DISSECTION  Dec. 2011  . BREAST LUMPECTOMY  2005  . IR CHOLANGIOGRAM EXISTING TUBE  10/10/2016  . IR EXCHANGE BILIARY DRAIN  09/18/2016  . IR FLUORO GUIDE CV LINE LEFT  09/05/2016  . IR GENERIC HISTORICAL  05/21/2016   IR RADIOLOGIST EVAL & MGMT 05/21/2016 Sandi Mariscal, MD [facility] INTERV RAD  . IR INT EXT BILIARY DRAIN WITH CHOLANGIOGRAM  09/06/2016  . IR US GUIDE VASC ACCESS LEFT  09/05/2016  . MEDIASTINOTOMY CHAMBERLAIN MCNEIL Left 06/02/2013   Procedure:  MEDIASTINOTOMY CHAMBERLAIN MCNEIL;  Surgeon: [name], MD;  Location: [place];  Service: Thoracic;  Laterality: Left;  LEFT ANTERIOR MEDIASTINOTOMY   . PORTACATH PLACEMENT  12/11  . removal portacath      There were no vitals filed for this visit.                       [place] Adult PT Treatment/Exercise - 10/23/16 0001      Manual Therapy   Manual Lymphatic Drainage (MLD) In supine with head of bed elevated:  short neck, left axilla and anterior interaxillary anastomosis, right groin and axillo-inguinal anastomosis; right upper outer breast, directing towards pathways; then right UE from dorsal hand to shoulder.    Other Manual Therapy In sitting, soft tissue work to entire back for pain relief.                  PT Short Term Goals - 02/12/16 1226      PT SHORT TERM GOAL #1   Title pain with walking decreased >/= 25%   Time 4   Period Weeks   Status Achieved           PT Long Term Goals - 08/05/16 1318      PT LONG TERM GOAL #1   Title indpendent with HEP   Time 8   Period Weeks   Status On-going     PT LONG TERM GOAL #2   Title pain with walking decreased >/= 75%   Time 8   Period  Weeks   Status On-going     PT LONG TERM GOAL #3   Title ability to flex her right hip with right groin pain decreased >/= 50% due to improved tissue mobility   Time 8   Period Weeks   Status Achieved     PT LONG TERM GOAL #4   Title waking up in middle of night with pain decreased >/=50%   Time 8   Period Weeks   Status Achieved     PT LONG TERM GOAL #5   Title reduction of pain by end of day >/= 50% due to increase tissue mobility   Time 8   Period Weeks   Status On-going           Long Term Clinic Goals - 10/07/16 5409      CC Long Term Goal  #2   Title Pt. will report swelling is adequately managed to enable ADL function at a consistent level.   Baseline This refers to her right breast/axilla/UE lymphedema and not the other  swelling that is current. However, this goal is deferred as patient now is unable to maintain function because of progression of cancer.   Status Deferred     CC Long Term Goal  #4   Title Pain/discomfort at right axilla area will be controlled at 6/10 or less.   Status On-going     CC Long Term Goal  #5   Title Patient will avoid infection by ongoing management of her lymphedema at right breast/axilla/upper arm areas.   Baseline She has not had any cellulitis related to lymphedema.   Status On-going            Plan - 10/23/16 1725    Clinical Impression Statement Continues to report benefit of relief of discomfort from therapy.   Clinical Impairments Affecting Rehab Potential end stage cancer; was referred to hospice but declined those services   PT Frequency 2x / week   PT Duration 12 weeks   PT Treatment/Interventions ADLs/Self Care Home Management;Therapeutic activities;Therapeutic exercise;Patient/family education;Manual techniques;Manual lymph drainage;Scar mobilization   PT Next Visit Plan manual lymph drainage, soft tissue, and myofascial release as needed    Consulted and Agree with Plan of Care Patient      Patient will benefit from skilled therapeutic intervention in order to improve the following deficits and impairments:  Increased edema, Increased fascial restricitons, Pain  Visit Diagnosis: Lymphedema, not elsewhere classified  Muscle stiffness     Problem List Patient Active Problem List   Diagnosis Date Noted  . Normochromic normocytic anemia 10/20/2016  . Hyponatremia 10/20/2016  . ARF (acute renal failure) ([place]) 10/20/2016  . Pressure injury of skin 10/20/2016  . Metastatic breast cancer ([place])   . Acute renal failure (ARF) ([place]) 10/19/2016  . Malnutrition of moderate degree ([place]) 09/22/2016  . Rigors 09/18/2016  . Cholestasis, intrahepatic 09/05/2016  . Cholelithiasis   . Jaundice   . RUQ pain 08/26/2016  . Liver metastases ([place]) 02/23/2016  .  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 Zoster 04/04/2015  . Nausea with vomiting 11/18/2014  . Constipation 11/18/2014  . Left-sided thoracic back pain   . Bone metastases ([place]) 11/16/2014  . Back pain 11/15/2014  . Uncontrolled pain 11/14/2014  . Post-lymphadenectomy lymphedema of arm 05/31/2014  . Malignant neoplasm of upper-outer quadrant of right female breast ([place]) 05/02/2014  . Chest wall pain 03/21/2014  . Abnormal LFTs (liver function tests) 09/12/2013  . Malignant neoplasm of female breast ([place]) 08/18/2013  .  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    Plan: Patient agrees to discharge.  Patient goals were met. Patient is being discharged due to                                                     Patient passed away from her cancer.?????    [name], PT 11/10/16 4:24 PM

## 2016-10-23 NOTE — Telephone Encounter (Signed)
[name] with [facility] in [place] called with elevated bilirubin of 18.3, her BUN and CR also elevated. Noted she has been elevated in past. Asked Anderson Malta to fax labs to [facility] RN with Dr Jana Hakim.

## 2016-10-23 NOTE — Telephone Encounter (Signed)
This RN contacted [name] [name] at [employer] office to verify per her VM of receipt of abnormal labs.  Per phone discussion this RN informed her of MD's discussion with the patient stating her concerns with communication with [facility] in the [place] office - she is requesting for her care to be transferred to the [facility] office. [name] [name] verified that [name] [name] has called them wanting extra nurse visits for dressing concerns - including stating " I had a nurse come out in 10 minutes from the other service ". She explained to [place] per request- and when asked to patient if her dressing concerns can be checked by her husband [name] [name] states " no- you should have in down that Dr Jana Hakim has ordered that I can have visits when needed ".  [name] [name] stated she will follow up with above- with concern if nursing is available through the [facility] office due to pt's home address.

## 2016-10-24 ENCOUNTER — Encounter: Payer: 59 | Admitting: Physical Therapy

## 2016-10-27 ENCOUNTER — Ambulatory Visit: Payer: 59 | Admitting: Physical Therapy

## 2016-10-28 ENCOUNTER — Telehealth: Payer: Self-pay | Admitting: Physical Therapy

## 2016-10-28 NOTE — Telephone Encounter (Signed)
[name] [name] phoned the cancer rehab clinic requesting I call her back as she reported it was an emergency. I was not working but phoned her from my cell from home. She was concerned that she had begun weeping fluid from her ankle area where she was very swollen and wondered if she could get an open wound. Since her home care nurse was with her, I spoke to the nurse, husband, and [name] [name] on speaker phone. I questioned if the fluid appeared to be clear and they said it was. I said that it was likely lymphatic fluid seeping through her skin due to the severity of her edema as this frequently happens in similar situations. I recommended they keep the area covered with gauze and keep it as dry as possible but that it was not an emergency and I felt it was something that would fairly easily be managed by her home nurse. This can be examined in person tomorrow when she comes to PT. They seemed ok with that plan. Annia Friendly, [place] 10/28/16 2:11 PM

## 2016-10-29 ENCOUNTER — Other Ambulatory Visit: Payer: Self-pay | Admitting: *Deleted

## 2016-10-29 ENCOUNTER — Telehealth: Payer: Self-pay | Admitting: *Deleted

## 2016-10-29 ENCOUNTER — Ambulatory Visit: Payer: 59 | Attending: Oncology | Admitting: Physical Therapy

## 2016-10-29 DIAGNOSIS — C787 Secondary malignant neoplasm of liver and intrahepatic bile duct: Secondary | ICD-10-CM

## 2016-10-29 DIAGNOSIS — E440 Moderate protein-calorie malnutrition: Secondary | ICD-10-CM

## 2016-10-29 DIAGNOSIS — C50011 Malignant neoplasm of nipple and areola, right female breast: Secondary | ICD-10-CM

## 2016-10-29 DIAGNOSIS — C7951 Secondary malignant neoplasm of bone: Secondary | ICD-10-CM

## 2016-10-29 DIAGNOSIS — L8942 Pressure ulcer of contiguous site of back, buttock and hip, stage 2: Secondary | ICD-10-CM

## 2016-10-29 DIAGNOSIS — R52 Pain, unspecified: Secondary | ICD-10-CM

## 2016-10-29 NOTE — Telephone Encounter (Signed)
This RN received call from pt and her husband stating need for electric hospital bed due to increased weakness and inability to reposition for symptom management in current bed.  [name] [name] is requesting non vinyl / non plastic mattress.  Order entered by this RN and printed for MD signature.

## 2016-10-30 ENCOUNTER — Telehealth: Payer: Self-pay | Admitting: Oncology

## 2016-10-30 ENCOUNTER — Other Ambulatory Visit: Payer: Self-pay

## 2016-10-30 ENCOUNTER — Telehealth: Payer: Self-pay | Admitting: *Deleted

## 2016-10-30 NOTE — Telephone Encounter (Signed)
[facility] called to give high lab values of 19.1 Bilirubin & Albumin 1.9. States these results were also faxed to [phone]. Talbert Cage, Triage nurse of this.

## 2016-10-30 NOTE — Telephone Encounter (Signed)
Notified Dr.Gudena. Copy provided for Dr.Magrinat's review. Val,RN notified. No further actions at this time.

## 2016-10-30 NOTE — Telephone Encounter (Signed)
Received lab results from [facility]. See prior note.

## 2016-10-30 NOTE — Telephone Encounter (Signed)
Received call back from [facility] with lab results. Tbili is 19.1 Albumin is 1.9

## 2016-10-30 NOTE — Telephone Encounter (Signed)
VM message received from [facility] with 'critical' lab results. She did not say what the results were.  TCT back to identified # of [phone]. No answer. Left VM for call back.

## 2016-11-02 ENCOUNTER — Other Ambulatory Visit: Payer: Self-pay | Admitting: Oncology

## 2016-11-03 ENCOUNTER — Ambulatory Visit: Payer: 59 | Admitting: Physical Therapy

## 2016-11-03 ENCOUNTER — Other Ambulatory Visit (HOSPITAL_COMMUNITY): Payer: 59

## 2016-11-04 ENCOUNTER — Telehealth: Payer: Self-pay

## 2016-11-04 ENCOUNTER — Other Ambulatory Visit: Payer: Self-pay

## 2016-11-04 DIAGNOSIS — C771 Secondary and unspecified malignant neoplasm of intrathoracic lymph nodes: Secondary | ICD-10-CM

## 2016-11-04 DIAGNOSIS — C50011 Malignant neoplasm of nipple and areola, right female breast: Secondary | ICD-10-CM

## 2016-11-04 DIAGNOSIS — C7951 Secondary malignant neoplasm of bone: Secondary | ICD-10-CM

## 2016-11-04 DIAGNOSIS — C50919 Malignant neoplasm of unspecified site of unspecified female breast: Secondary | ICD-10-CM

## 2016-11-04 DIAGNOSIS — C787 Secondary malignant neoplasm of liver and intrahepatic bile duct: Secondary | ICD-10-CM

## 2016-11-04 NOTE — Telephone Encounter (Signed)
Phone call received form Jeanmarie Hubert, Rn with [facility]  Per Angie, pt's biliary drain dsg was dry and intact during pt visit on 11/03/16.  Pt verbalized on the phone to nurse today that dsg still dry / intact, therefore [facility] will not go see pt today.

## 2016-11-04 NOTE — Telephone Encounter (Signed)
Per Dr Jana Hakim referral has been placed for in home palliative care services. Spoke with Dr Konrad Dolores at [place] and give referral information. Order has also been faxed.  Order for [facility] placed to [facility] per Dr Jana Hakim and faxed to [facility]

## 2016-11-05 ENCOUNTER — Encounter: Payer: 59 | Admitting: Physical Therapy

## 2016-11-05 ENCOUNTER — Telehealth: Payer: Self-pay

## 2016-11-05 NOTE — Telephone Encounter (Signed)
Spoke with pt's spouse by phone regarding change in status of pt as reported to this RN by [facility] nurse.  Spouse confirms that pt is more lethargic and confused today but appears to be comfortable.  Spouse confirms that he was contacted by [facility]osp[facility] and [place] and they are to come to the home on Monday 11/10/16 as per the pt's request.  This RN informed spouse that should he/they need anything between now and then to please call and we will accomodate.

## 2016-11-06 ENCOUNTER — Telehealth: Payer: Self-pay | Admitting: *Deleted

## 2016-11-06 NOTE — Telephone Encounter (Signed)
TC from Palliative Care nurse navigator, [facility]. She states that pt's husband has called saying that pt's condition has changed.  NP from Palliative Care has made a visit today and indicates pt may be actively dying.  Inez Catalina is asking for verbal order to transition pt to full [facility]osp[facility]/ possible [employer] [facility]. Verbal order given. Dr. Jana Hakim will be the attending MD

## 2016-11-07 ENCOUNTER — Telehealth: Payer: Self-pay

## 2016-11-07 NOTE — Telephone Encounter (Signed)
VM from pt's spouse stating he wishes to have pt admitted to [facility].  Call placed to Hospice to update referral.   Most recent office note faxed per Hospice request. Call placed to [facility] requesting their most recent notes be faxed to Hospice as well

## 2016-11-10 ENCOUNTER — Encounter: Payer: 59 | Admitting: Physical Therapy

## 2016-11-12 ENCOUNTER — Encounter: Payer: 59 | Admitting: Physical Therapy

## 2016-11-13 ENCOUNTER — Other Ambulatory Visit: Payer: Self-pay | Admitting: Oncology

## 2016-11-17 ENCOUNTER — Encounter: Payer: 59 | Admitting: Physical Therapy

## 2016-11-19 ENCOUNTER — Encounter: Payer: 59 | Admitting: Physical Therapy

## 2016-11-24 ENCOUNTER — Ambulatory Visit (HOSPITAL_COMMUNITY): Payer: 59

## 2016-11-24 ENCOUNTER — Encounter: Payer: 59 | Admitting: Physical Therapy

## 2016-11-24 ENCOUNTER — Inpatient Hospital Stay (HOSPITAL_COMMUNITY): Admission: RE | Admit: 2016-11-24 | Payer: 59 | Source: Ambulatory Visit

## 2016-11-27 ENCOUNTER — Encounter: Payer: 59 | Admitting: Physical Therapy

## 2016-11-29 DEATH — deceased

## 2016-12-07 ENCOUNTER — Encounter: Payer: Self-pay | Admitting: Oncology

## 2016-12-07 ENCOUNTER — Other Ambulatory Visit: Payer: Self-pay | Admitting: Oncology

## 2017-01-09 ENCOUNTER — Other Ambulatory Visit: Payer: Self-pay | Admitting: Nurse Practitioner

## 2017-01-16 NOTE — Telephone Encounter (Signed)
No entry
